# Patient Record
Sex: Female | Born: 1975 | Race: Black or African American | Hispanic: No | Marital: Married | Smoking: Never smoker
Health system: Southern US, Community
[De-identification: ages and names within clinical notes are randomized; demographics above are authoritative.]

## PROBLEM LIST (undated history)

## (undated) ENCOUNTER — Inpatient Hospital Stay (HOSPITAL_COMMUNITY): Payer: Self-pay

## (undated) DIAGNOSIS — F419 Anxiety disorder, unspecified: Secondary | ICD-10-CM

## (undated) DIAGNOSIS — G4730 Sleep apnea, unspecified: Secondary | ICD-10-CM

## (undated) DIAGNOSIS — I509 Heart failure, unspecified: Secondary | ICD-10-CM

## (undated) DIAGNOSIS — E669 Obesity, unspecified: Secondary | ICD-10-CM

## (undated) DIAGNOSIS — G43909 Migraine, unspecified, not intractable, without status migrainosus: Secondary | ICD-10-CM

## (undated) DIAGNOSIS — F418 Other specified anxiety disorders: Secondary | ICD-10-CM

## (undated) DIAGNOSIS — O200 Threatened abortion: Secondary | ICD-10-CM

## (undated) DIAGNOSIS — O039 Complete or unspecified spontaneous abortion without complication: Secondary | ICD-10-CM

## (undated) DIAGNOSIS — K5792 Diverticulitis of intestine, part unspecified, without perforation or abscess without bleeding: Secondary | ICD-10-CM

## (undated) DIAGNOSIS — J811 Chronic pulmonary edema: Secondary | ICD-10-CM

## (undated) DIAGNOSIS — M1710 Unilateral primary osteoarthritis, unspecified knee: Secondary | ICD-10-CM

## (undated) DIAGNOSIS — E119 Type 2 diabetes mellitus without complications: Secondary | ICD-10-CM

## (undated) DIAGNOSIS — O1490 Unspecified pre-eclampsia, unspecified trimester: Secondary | ICD-10-CM

## (undated) DIAGNOSIS — I517 Cardiomegaly: Secondary | ICD-10-CM

## (undated) DIAGNOSIS — Z3490 Encounter for supervision of normal pregnancy, unspecified, unspecified trimester: Secondary | ICD-10-CM

## (undated) DIAGNOSIS — I5042 Chronic combined systolic (congestive) and diastolic (congestive) heart failure: Secondary | ICD-10-CM

## (undated) DIAGNOSIS — I10 Essential (primary) hypertension: Secondary | ICD-10-CM

## (undated) DIAGNOSIS — D649 Anemia, unspecified: Secondary | ICD-10-CM

## (undated) HISTORY — DX: Other specified anxiety disorders: F41.8

## (undated) HISTORY — DX: Diverticulitis of intestine, part unspecified, without perforation or abscess without bleeding: K57.92

## (undated) HISTORY — DX: Obesity, unspecified: E66.9

## (undated) HISTORY — PX: CHOLECYSTECTOMY: SHX55

## (undated) HISTORY — DX: Type 2 diabetes mellitus without complications: E11.9

## (undated) HISTORY — DX: Essential (primary) hypertension: I10

## (undated) HISTORY — DX: Anemia, unspecified: D64.9

## (undated) HISTORY — DX: Chronic combined systolic (congestive) and diastolic (congestive) heart failure: I50.42

## (undated) HISTORY — DX: Threatened abortion: O20.0

## (undated) HISTORY — DX: Unilateral primary osteoarthritis, unspecified knee: M17.10

## (undated) HISTORY — DX: Complete or unspecified spontaneous abortion without complication: O03.9

## (undated) HISTORY — DX: Migraine, unspecified, not intractable, without status migrainosus: G43.909

---

## 2000-01-01 ENCOUNTER — Other Ambulatory Visit: Admission: RE | Admit: 2000-01-01 | Discharge: 2000-01-01 | Payer: Self-pay | Admitting: Obstetrics and Gynecology

## 2000-01-01 ENCOUNTER — Other Ambulatory Visit: Admission: RE | Admit: 2000-01-01 | Discharge: 2000-01-01 | Payer: Self-pay | Admitting: Gynecology

## 2000-05-17 HISTORY — PX: BREAST REDUCTION SURGERY: SHX8

## 2000-09-21 ENCOUNTER — Other Ambulatory Visit: Admission: RE | Admit: 2000-09-21 | Discharge: 2000-09-21 | Payer: Self-pay | Admitting: Plastic Surgery

## 2000-09-21 ENCOUNTER — Encounter (INDEPENDENT_AMBULATORY_CARE_PROVIDER_SITE_OTHER): Payer: Self-pay | Admitting: Specialist

## 2001-11-23 ENCOUNTER — Ambulatory Visit (HOSPITAL_COMMUNITY): Admission: RE | Admit: 2001-11-23 | Discharge: 2001-11-23 | Payer: Self-pay | Admitting: *Deleted

## 2001-11-23 ENCOUNTER — Encounter: Payer: Self-pay | Admitting: *Deleted

## 2004-03-26 ENCOUNTER — Ambulatory Visit: Payer: Self-pay | Admitting: *Deleted

## 2004-04-02 ENCOUNTER — Ambulatory Visit: Payer: Self-pay | Admitting: Family Medicine

## 2004-11-20 ENCOUNTER — Encounter (INDEPENDENT_AMBULATORY_CARE_PROVIDER_SITE_OTHER): Payer: Self-pay | Admitting: *Deleted

## 2004-11-20 LAB — CONVERTED CEMR LAB
AST: 12 units/L
Albumin: 4 g/dL
BUN: 14 mg/dL
CO2: 24 meq/L
Calcium: 9.2 mg/dL
Chloride: 103 meq/L
Glucose, Bld: 92 mg/dL
Potassium: 4.4 meq/L
Sodium: 137 meq/L
Triglycerides: 225 mg/dL

## 2006-12-02 ENCOUNTER — Ambulatory Visit (HOSPITAL_COMMUNITY): Admission: RE | Admit: 2006-12-02 | Discharge: 2006-12-02 | Payer: Self-pay | Admitting: Obstetrics and Gynecology

## 2007-04-26 ENCOUNTER — Emergency Department (HOSPITAL_COMMUNITY): Admission: EM | Admit: 2007-04-26 | Discharge: 2007-04-26 | Payer: Self-pay | Admitting: Emergency Medicine

## 2007-05-14 ENCOUNTER — Emergency Department (HOSPITAL_COMMUNITY): Admission: EM | Admit: 2007-05-14 | Discharge: 2007-05-14 | Payer: Self-pay | Admitting: Emergency Medicine

## 2007-08-21 ENCOUNTER — Emergency Department (HOSPITAL_COMMUNITY): Admission: EM | Admit: 2007-08-21 | Discharge: 2007-08-21 | Payer: Self-pay | Admitting: Emergency Medicine

## 2008-01-16 ENCOUNTER — Ambulatory Visit: Payer: Self-pay | Admitting: Cardiology

## 2008-01-18 ENCOUNTER — Encounter: Payer: Self-pay | Admitting: Cardiology

## 2008-02-16 ENCOUNTER — Ambulatory Visit: Payer: Self-pay | Admitting: Cardiology

## 2008-03-01 ENCOUNTER — Ambulatory Visit: Payer: Self-pay | Admitting: Cardiology

## 2008-03-19 ENCOUNTER — Ambulatory Visit: Payer: Self-pay | Admitting: Cardiology

## 2008-04-10 ENCOUNTER — Ambulatory Visit: Payer: Self-pay | Admitting: Cardiology

## 2008-04-10 ENCOUNTER — Encounter: Payer: Self-pay | Admitting: Cardiology

## 2008-04-17 ENCOUNTER — Ambulatory Visit: Payer: Self-pay | Admitting: Cardiology

## 2008-04-22 ENCOUNTER — Encounter: Payer: Self-pay | Admitting: Physician Assistant

## 2008-06-03 ENCOUNTER — Encounter: Payer: Self-pay | Admitting: Physician Assistant

## 2008-06-06 ENCOUNTER — Ambulatory Visit: Payer: Self-pay | Admitting: Cardiology

## 2008-06-26 ENCOUNTER — Encounter: Payer: Self-pay | Admitting: Cardiology

## 2008-06-26 ENCOUNTER — Ambulatory Visit: Payer: Self-pay | Admitting: Cardiology

## 2008-07-22 ENCOUNTER — Emergency Department (HOSPITAL_COMMUNITY): Admission: EM | Admit: 2008-07-22 | Discharge: 2008-07-22 | Payer: Self-pay | Admitting: Emergency Medicine

## 2008-07-24 ENCOUNTER — Ambulatory Visit: Payer: Self-pay | Admitting: Cardiology

## 2008-07-30 ENCOUNTER — Emergency Department (HOSPITAL_COMMUNITY): Admission: EM | Admit: 2008-07-30 | Discharge: 2008-07-30 | Payer: Self-pay | Admitting: Emergency Medicine

## 2008-07-30 ENCOUNTER — Encounter: Payer: Self-pay | Admitting: Cardiology

## 2008-10-08 ENCOUNTER — Ambulatory Visit: Payer: Self-pay | Admitting: Cardiology

## 2008-11-12 ENCOUNTER — Emergency Department (HOSPITAL_COMMUNITY): Admission: EM | Admit: 2008-11-12 | Discharge: 2008-11-12 | Payer: Self-pay | Admitting: Emergency Medicine

## 2008-12-19 ENCOUNTER — Telehealth: Payer: Self-pay | Admitting: Cardiology

## 2009-01-02 ENCOUNTER — Telehealth (INDEPENDENT_AMBULATORY_CARE_PROVIDER_SITE_OTHER): Payer: Self-pay | Admitting: *Deleted

## 2009-04-16 ENCOUNTER — Encounter (INDEPENDENT_AMBULATORY_CARE_PROVIDER_SITE_OTHER): Payer: Self-pay | Admitting: *Deleted

## 2009-04-16 ENCOUNTER — Ambulatory Visit: Payer: Self-pay | Admitting: Cardiology

## 2009-04-16 DIAGNOSIS — E669 Obesity, unspecified: Secondary | ICD-10-CM

## 2009-04-16 HISTORY — DX: Obesity, unspecified: E66.9

## 2009-05-03 ENCOUNTER — Emergency Department (HOSPITAL_COMMUNITY): Admission: EM | Admit: 2009-05-03 | Discharge: 2009-05-03 | Payer: Self-pay | Admitting: Emergency Medicine

## 2009-07-20 ENCOUNTER — Emergency Department (HOSPITAL_COMMUNITY): Admission: EM | Admit: 2009-07-20 | Discharge: 2009-07-20 | Payer: Self-pay | Admitting: Emergency Medicine

## 2009-12-08 ENCOUNTER — Encounter (INDEPENDENT_AMBULATORY_CARE_PROVIDER_SITE_OTHER): Payer: Self-pay | Admitting: *Deleted

## 2009-12-09 ENCOUNTER — Ambulatory Visit: Payer: Self-pay | Admitting: Cardiology

## 2009-12-09 ENCOUNTER — Encounter: Payer: Self-pay | Admitting: Adult Health

## 2009-12-09 ENCOUNTER — Encounter (INDEPENDENT_AMBULATORY_CARE_PROVIDER_SITE_OTHER): Payer: Self-pay | Admitting: *Deleted

## 2009-12-09 DIAGNOSIS — Z9989 Dependence on other enabling machines and devices: Secondary | ICD-10-CM

## 2009-12-09 DIAGNOSIS — G4733 Obstructive sleep apnea (adult) (pediatric): Secondary | ICD-10-CM | POA: Insufficient documentation

## 2009-12-09 LAB — CONVERTED CEMR LAB
BUN: 12 mg/dL
CO2: 20 meq/L (ref 19–32)
Chloride: 105 meq/L (ref 96–112)
Creatinine, Ser: 0.79 mg/dL
Glucose, Bld: 97 mg/dL
Potassium: 3.7 meq/L
Potassium: 3.7 meq/L (ref 3.5–5.3)
Sodium: 139 meq/L (ref 135–145)

## 2009-12-12 ENCOUNTER — Encounter (INDEPENDENT_AMBULATORY_CARE_PROVIDER_SITE_OTHER): Payer: Self-pay | Admitting: *Deleted

## 2009-12-12 ENCOUNTER — Ambulatory Visit: Payer: Self-pay | Admitting: Cardiology

## 2009-12-12 DIAGNOSIS — E876 Hypokalemia: Secondary | ICD-10-CM | POA: Insufficient documentation

## 2009-12-12 LAB — CONVERTED CEMR LAB
Calcium: 9.5 mg/dL
Glucose, Bld: 170 mg/dL
Potassium: 4 meq/L
Sodium: 132 meq/L

## 2009-12-14 LAB — CONVERTED CEMR LAB
BUN: 12 mg/dL (ref 6–23)
CO2: 24 meq/L (ref 19–32)
Chloride: 101 meq/L (ref 96–112)
Creatinine, Ser: 0.88 mg/dL (ref 0.40–1.20)
Glucose, Bld: 170 mg/dL — ABNORMAL HIGH (ref 70–99)
Potassium: 4 meq/L (ref 3.5–5.3)

## 2009-12-15 ENCOUNTER — Encounter: Payer: Self-pay | Admitting: Cardiology

## 2009-12-15 ENCOUNTER — Ambulatory Visit: Payer: Self-pay | Admitting: Cardiology

## 2009-12-15 ENCOUNTER — Ambulatory Visit (HOSPITAL_COMMUNITY): Admission: RE | Admit: 2009-12-15 | Discharge: 2009-12-15 | Payer: Self-pay | Admitting: Cardiology

## 2009-12-22 ENCOUNTER — Ambulatory Visit: Payer: Self-pay | Admitting: Cardiology

## 2010-01-08 ENCOUNTER — Encounter: Payer: Self-pay | Admitting: Adult Health

## 2010-01-09 ENCOUNTER — Encounter (INDEPENDENT_AMBULATORY_CARE_PROVIDER_SITE_OTHER): Payer: Self-pay | Admitting: *Deleted

## 2010-01-13 ENCOUNTER — Ambulatory Visit: Payer: Self-pay | Admitting: Cardiology

## 2010-01-14 ENCOUNTER — Telehealth (INDEPENDENT_AMBULATORY_CARE_PROVIDER_SITE_OTHER): Payer: Self-pay

## 2010-03-12 ENCOUNTER — Encounter (INDEPENDENT_AMBULATORY_CARE_PROVIDER_SITE_OTHER): Payer: Self-pay | Admitting: *Deleted

## 2010-03-16 ENCOUNTER — Ambulatory Visit: Payer: Self-pay | Admitting: Cardiology

## 2010-06-09 ENCOUNTER — Emergency Department (HOSPITAL_COMMUNITY)
Admission: EM | Admit: 2010-06-09 | Discharge: 2010-06-10 | Payer: Self-pay | Source: Home / Self Care | Admitting: Emergency Medicine

## 2010-06-16 NOTE — Letter (Signed)
Summary: Appointment - Missed  [facility]  [address], [place] [address]   Phone: [phone]  Fax: [phone]     March 12, 2010 MRN: [id number]   Chelsey Santiago [address] [place], [place]  [address]   Dear Chelsey Santiago,  Our records indicate you missed your appointment on     03/12/10 Joni Reining NP          It is very important that we reach you to reschedule this appointment. We look forward to participating in your health care needs. Please contact us at the number listed above at your earliest convenience to reschedule this appointment.     Sincerely,    Glass blower/designer

## 2010-06-16 NOTE — Assessment & Plan Note (Signed)
Summary: nurse visit per checkout on 12/09/09 for bp check/tg  Nurse Visit   Vital Signs:  Patient profile:   35 year old female Height:      66 inches Weight:      246 pounds O2 Sat:      97 % on Room air Pulse rate:   80 / minute Pulse (ortho):   86 / minute BP sitting:   141 / 93  (left arm) BP standing:   112 / 72  Vitals Entered By: Teressa Lower RN (December 12, 2009 9:39 AM)  O2 Flow:  Room air  Serial Vital Signs/Assessments:  Time      Position  BP       Pulse  Resp  Temp     By 10:12 AM  Lying LA  122/79   78                    Kynzley Dowson RN 10:12 AM  Sitting   117/76   78                    Shellyann Wandrey RN 10:12 AM  Standing  112/72   86                    Babatunde Seago RN  Comments: 10:12 AM no c/o during ortho vs By: Teressa Lower RN    Visit Type:  3 day nurse visit Primary Provider:  [facility] Department   History of Present Illness: S: HTN:167/111 at of 12/09/2009 B:gave benicar/hct 40/12.5 samples in office 12/09/09, bmp 12/09/09 K+ 3.7     pt has lost 7 lbs since 3 days A: c/o weakness and dizziness today only R: Spoke with Dr. Daleen Squibb DOD- orders given for ortho vs, bmp stat  12/15/09 bmp : sodium 132- no change in tx at this time continue current meds per orders Friday from DOD Dr. Daleen Squibb   Preventive Screening-Counseling & Management  Alcohol-Tobacco     Smoking Status: never  Current Medications (verified): 1)  Labetalol Hcl 200 Mg Tabs (Labetalol Hcl) .... Take 4 (Four) Tabs Twice A Day 2)  Clonidine Hcl 0.2 Mg Tabs (Clonidine Hcl) .... Take 1-2 Tablet By Mouth Every Day At Bedtime For Sleep As Needed 3)  Amlodipine Besylate 5 Mg Tabs (Amlodipine Besylate) .... Take One Tablet By Mouth Daily 4)  Lexapro 10 Mg Tabs (Escitalopram Oxalate) .... Take 1 Tab Daily 5)  Alprazolam 1 Mg Tabs (Alprazolam) .... Take 1 Tab Two Times A Day 6)  Benicar Hct 40-12.5 Mg Tabs (Olmesartan Medoxomil-Hctz) .... Take 1 Tablet By Mouth Once A Day  Samples  Allergies (verified): No Known Drug Allergies  Orders Added: 1)  T-Basic Metabolic Panel [[id number]]    Appended Document: nurse visit per checkout on 12/09/09 for bp check/tg No changes continue benicar as directed.  If Rx needed,please call in.  Appended Document: nurse visit per checkout on 12/09/09 for bp check/tg    Clinical Lists Changes  Medications: Changed medication from BENICAR HCT 40-12.5 MG TABS (OLMESARTAN MEDOXOMIL-HCTZ) Take 1 tablet by mouth once a day SAMPLES to BENICAR HCT 40-12.5 MG TABS (OLMESARTAN MEDOXOMIL-HCTZ) Take 1 tablet by mouth once a day - Signed Rx of BENICAR HCT 40-12.5 MG TABS (OLMESARTAN MEDOXOMIL-HCTZ) Take 1 tablet by mouth once a day;  #30 x 6;  Signed;  Entered by: Teressa Lower RN;  Authorized by: Joni Reining, NP;  Method used: Electronically to [facility]  [address], [place], [place], [place]  [address], Ph: [phone], Fax: [phone]    Prescriptions: BENICAR HCT 40-12.5 MG TABS (OLMESARTAN MEDOXOMIL-HCTZ) Take 1 tablet by mouth once a day  #30 x 6   Entered by:   Teressa Lower RN   Authorized by:   Joni Reining, NP   Signed by:   Teressa Lower RN on 12/31/2009   Method used:   Electronically to        [facility]  [address] (retail)       [address]       [place], [place]  [address]       Ph: [phone]       Fax: [phone]   RxID:   [id number]

## 2010-06-16 NOTE — Assessment & Plan Note (Signed)
**Note De-Identified Hoorain Kozakiewicz Obfuscation** Summary: BLOOD PRES CK/RM  Nurse Visit   Vital Signs:  Patient profile:   35 year old female Weight:      246 pounds O2 Sat:      97 % on Room air Pulse rate:   82 / minute Pulse (ortho):   93 / minute BP sitting:   114 / 79  (left arm) BP standing:   100 / 66  Vitals Entered ByLarita Fife Auden Wettstein LPN (December 22, 2009 2:21 PM)  O2 Flow:  Room air  Serial Vital Signs/Assessments:  Time      Position  BP       Pulse  Resp  Temp     By 2:22 PM   Lying LA  115/79   81                    Lynn Felishia Wartman LPN 0:98 PM   Sitting   111/73   83                    Lynn Breylon Sherrow LPN 1:19 PM   Standing  100/66   93                    Lynn Kiosha Buchan LPN  Comments: no s/s  By: Larita Fife Naturi Alarid LPN    Current Medications (verified): 1)  Labetalol Hcl 200 Mg Tabs (Labetalol Hcl) .... Take 4 (Four) Tabs Twice A Day 2)  Clonidine Hcl 0.2 Mg Tabs (Clonidine Hcl) .... Take 1-2 Tablet By Mouth Every Day At Bedtime For Sleep As Needed 3)  Amlodipine Besylate 5 Mg Tabs (Amlodipine Besylate) .... Take One Tablet By Mouth Daily 4)  Lexapro 10 Mg Tabs (Escitalopram Oxalate) .... Take 1 Tab Daily 5)  Alprazolam 1 Mg Tabs (Alprazolam) .... Take 1 Tab Two Times A Day 6)  Benicar Hct 40-12.5 Mg Tabs (Olmesartan Medoxomil-Hctz) .... Take 1 Tablet By Mouth Once A Day Samples  Allergies (verified): No Known Drug Allergies  Primary Provider:  [facility] Department   History of Present Illness: S: Pt. walked into office with c/o dizziness, weakness and fatigue. B: On 12-09-09 OV with Harriet Pho, NP pt. was given samples of Benicar/HCT 40/12.5mg  once daily for HTN. Pt. returned 3 days later (12-12-09)for scheduled BP check/nurse visit, at that time pt. had lost 7 lbs. and c/o weakness and dizziness. Dr. Daleen Squibb, DOD, ordered a BMET stat which revealed sodium level of 132. Dr. Daleen Squibb made no changes in treatment at that time and recommended pt. continue current meds. A: Pt. c/o being weak and tired often and dizziness. BP today is  114/79 with a HR of 82. Pt. states her BP is never this low and wants to know if this is normal for her to feel this way, as her BP is normally higher, or does her meds need to be adjusted. Pt. did not bring in meds, we went over list and pt. states she takes as advised.  R: Pt. advised to be careful while mobile and we will call her with Dr. Marvel Plan recommendations, if any.   Appended Document: BLOOD PRES CK/RM 12/28/09  Instructions for alprazolam and clonidine are reversed; please correct. D/C benicar/HCT. Lisinopril 20 mg once daily. Home BPs BP check in 1 month with orthostatic VS BMet in 1 month.  [place] Bing, M.D.  Appended Document: BLOOD PRES CK/RM Pt. does take Clonidine 0.2mg  at bedtime for psychiatric reasons per her psychiatrist and pt. confirmed alprazolam 1mg  by **Note De-Identified  Obfuscation** mouth two times a day. Pt. states she was just advised by K. Lyman Bishop, NP on 12-09-09 OV to stop taking Lisinopril and to start taking Benicar/HCTZ 40/12.5mg  once daily and states she has no complaints, wants to know if she can stay on Benicar and if so will she still need BMET in 1 month? Please advise. She states she will check BP's at [facility] as often as possible. Pt. has appt. to see Dr. Dietrich Pates on 01-13-10 @ 1:15.

## 2010-06-16 NOTE — Letter (Signed)
Summary: DRUG REQUEST FORM/BENICAR  DRUG REQUEST FORM/BENICAR   Imported By: Faythe Ghee 01/08/2010 12:25:27  _____________________________________________________________________  External Attachment:    Type:   Image     Comment:   External Document

## 2010-06-16 NOTE — Miscellaneous (Signed)
Summary: LABS CMP,LIPIDS,11/20/2004  Clinical Lists Changes  Observations: Added new observation of CALCIUM: 9.2 mg/dL (91/47/8295 62:13) Added new observation of ALBUMIN: 4.0 g/dL (08/65/7846 96:29) Added new observation of PROTEIN, TOT: 7.6 g/dL (52/84/1324 40:10) Added new observation of SGPT (ALT): 11 units/L (11/20/2004 10:54) Added new observation of SGOT (AST): 12 units/L (11/20/2004 10:54) Added new observation of ALK PHOS: 75 units/L (11/20/2004 10:54) Added new observation of CREATININE: 14 mg/dL (27/25/3664 40:34) Added new observation of BUN: 14 mg/dL (74/25/9563 87:56) Added new observation of BG RANDOM: 92 mg/dL (43/32/9518 84:16) Added new observation of CO2 PLSM/SER: 24 meq/L (11/20/2004 10:54) Added new observation of CL SERUM: 103 meq/L (11/20/2004 10:54) Added new observation of K SERUM: 4.4 meq/L (11/20/2004 10:54) Added new observation of NA: 137 meq/L (11/20/2004 10:54) Added new observation of LDL: 71 mg/dL (60/63/0160 10:93) Added new observation of HDL: 38 mg/dL (23/55/7322 02:54) Added new observation of TRIGLYC TOT: 225 mg/dL (27/10/2374 28:31) Added new observation of CHOLESTEROL: 154 mg/dL (51/76/1607 37:10)

## 2010-06-16 NOTE — Miscellaneous (Signed)
Summary: LABS 12/09/2009 BMP,12/12/2009  Clinical Lists Changes  Observations: Added new observation of CALCIUM: 9.5 mg/dL (16/02/9603 54:09) Added new observation of CREATININE: 0.88 mg/dL (81/19/1478 29:56) Added new observation of BUN: 12 mg/dL (21/30/8657 84:69) Added new observation of BG RANDOM: 170 mg/dL (62/95/2841 32:44) Added new observation of CO2 PLSM/SER: 24 meq/L (12/12/2009 11:53) Added new observation of CL SERUM: 101 meq/L (12/12/2009 11:53) Added new observation of K SERUM: 4.0 meq/L (12/12/2009 11:53) Added new observation of NA: 132 meq/L (12/12/2009 11:53) Added new observation of CALCIUM: 8.7 mg/dL (05/19/7251 66:44) Added new observation of CREATININE: 0.79 mg/dL (03/47/4259 56:38) Added new observation of BUN: 12 mg/dL (75/64/3329 51:88) Added new observation of BG RANDOM: 97 mg/dL (41/66/0630 16:01) Added new observation of CO2 PLSM/SER: 20 meq/L (12/09/2009 11:53) Added new observation of CL SERUM: 105 meq/L (12/09/2009 11:53) Added new observation of K SERUM: 3.7 meq/L (12/09/2009 11:53) Added new observation of NA: 139 meq/L (12/09/2009 11:53)

## 2010-06-16 NOTE — Progress Notes (Signed)
**Note De-Identified Marijean Montanye Obfuscation** Summary: Diovan approval  Phone Note Outgoing Call   Call placed by: Larita Fife Khamille Beynon LPN,  January 14, 2010 3:03 PM Summary of Call: [facility] pharmacy notified that Diovan HCT 320/12.5mg  one tablet daily has been approved. While on hold, pharmacy verified approval and is filling RX. Initial call taken by: Larita Fife Zeenat Jeanbaptiste LPN,  January 14, 2010 3:11 PM

## 2010-06-16 NOTE — Assessment & Plan Note (Signed)
Summary: 1 mth f/u per checkout on 12/09/09/tg   Visit Type:  Follow-up Primary Provider:  [facility] Department   History of Present Illness: [name] returns to the office as scheduled for continued assessment and treatment of hypertensive heart disease with a history of congestive heart failure immediately following pregnancy and delivery.  Since then, she has done extremely well.  She is exercising frequently and has lost 9 pounds.  Blood pressure was ideal when last checked a month ago.  Unfortunately, she has had some personal problems including the death of a cousin and hospitalization of her mother for congestive heart failure.  This has led to suboptimal compliance with her medical regime.  Patient has no clinical history of diabetes and was apparently not diabetic during her recent pregnancy; however, a blood glucose level of 170 was recorded last month; a subsequent measurement was normal.  Current Medications (verified): 1)  Labetalol Hcl 200 Mg Tabs (Labetalol Hcl) .... Take 4 (Four) Tabs Twice A Day 2)  Clonidine Hcl 0.2 Mg Tabs (Clonidine Hcl) .... Take 1-2 Tablet By Mouth Every Day At Bedtime For Sleep As Needed 3)  Amlodipine Besylate 5 Mg Tabs (Amlodipine Besylate) .... Take One Tablet By Mouth Daily 4)  Lexapro 10 Mg Tabs (Escitalopram Oxalate) .... Take 1 Tab Daily 5)  Alprazolam 1 Mg Tabs (Alprazolam) .... Take 1 Tab Two Times A Day 6)  Diovan Hct 320-12.5 Mg Tabs (Valsartan-Hydrochlorothiazide) .... Take 1 Tablet By Mouth Once A Day  Allergies (verified): No Known Drug Allergies  Past History:  PMH, FH, and Social History reviewed and updated.  Past Medical History: Hypertension Hypertensive heart disease with history of pulmonary edema postpartum Migraine headaches Anemia Mitral regurg-mild to mod. when hospitalized for CHF in 2009; no MR in 12/2009+nl EF  Review of Systems       The patient complains of weight loss.  The patient denies  anorexia, hoarseness, chest pain, syncope, dyspnea on exertion, peripheral edema, prolonged cough, headaches, and abdominal pain.    Vital Signs:  Patient profile:   35 year old female Weight:      244 pounds BMI:     39.52 Pulse rate:   111 / minute BP sitting:   161 / 104  (right arm)  Vitals Entered By: Dreama Saa, CNA (January 13, 2010 1:24 PM)  Physical Exam  General:  Obese; well developed; no acute distress:   Neck-No JVD; no carotid bruits: Lungs-No tachypnea, no rales; no rhonchi; no wheezes: Cardiovascular-normal PMI; normal S1 and S2; S4 present Abdomen-BS normal; soft and non-tender without masses or organomegaly:  Musculoskeletal-No deformities, no cyanosis or clubbing: Neurologic-Normal cranial nerves; symmetric strength and tone:  Skin-Warm, no significant lesions: Extremities-Nl distal pulses; no edema:     Impression & Recommendations:  Problem # 1:  HYPERTENSIVE HEART DISEASE, WITH CHF (ICD-402.11) Since blood pressure control was excellent a month ago, current medications will be continued.  Patient will monitor blood pressure at home and return for a visit with the nurses in 2 months.  At the request of her insurer, valsartan will be substituted for Benicar at maximal dose.  Chemistry profile will be checked in 2 months.  Since a random blood glucose value was fairly high, a hemoglobin A1c level will also be obtained.  I will reassess this nice woman in 6 months.  She is congratulated on exercise and weight loss and encouraged to continue compliance with her antihypertensive regime.  Patient Instructions: 1)  Your physician  recommends that you schedule a follow-up appointment in: 6 months 2)  Your physician recommends that you return for lab work in: 2 months 3)  You have been referred to nurse visit in 2 months 4)  Your physician discussed the importance of regular exercise and recommended that you start or continue a regular exercise program for good  health. 5)  Your physician encouraged you to lose weight for better health. 6)  Your physician has requested that you regularly monitor and record your blood pressure readings at home.  Please use the same machine at the same time of day to check your readings and record them to bring to your follow-up visit. ( large cuff) 7)  Your physician has recommended you make the following change in your medication: stop benicar/hct and begin diovan/hct 320/12.5 daily Prescriptions: DIOVAN HCT 320-12.5 MG TABS (VALSARTAN-HYDROCHLOROTHIAZIDE) Take 1 tablet by mouth once a day  #30 x 6   Entered by:   Teressa Lower RN   Authorized by:   Kathlen Brunswick, MD, [facility]   Signed by:   Teressa Lower RN on 01/13/2010   Method used:   Electronically to        [facility]  [address] (retail)       [address]       [place], [place]  [address]       Ph: [phone]       Fax: [phone]   RxID:   [id number]

## 2010-06-16 NOTE — Miscellaneous (Signed)
Summary: ECHO 12/15/2009  Clinical Lists Changes  Observations: Added new observation of ECHOINTERP:   Study Conclusions    - Left ventricle: The cavity size was at the upper limits of normal.     Borderline LVH was present. Systolic function was low normal to     mildly reduced with an estimated ejection fraction of 55%. Mild     hypokinesis of the anteroseptal myocardium.   - Left atrium: The atrium was mildly dilated.   Transthoracic echocardiography. M-mode, complete 2D, spectral   Doppler, and color Doppler. Height: Height: 167.6cm. Height: 66in.   Weight: Weight: 111.6kg. Weight: 245.5lb. Body mass index: BMI:   39.7kg/m^2. Body surface area: BSA: 2.25m^2. Patient status:   Outpatient. Location: Echo laboratory.    --------------------------------------------------------------------  (12/15/2009 11:56)      Echocardiogram  Procedure date:  12/15/2009  Findings:        Study Conclusions    - Left ventricle: The cavity size was at the upper limits of normal.     Borderline LVH was present. Systolic function was low normal to     mildly reduced with an estimated ejection fraction of 55%. Mild     hypokinesis of the anteroseptal myocardium.   - Left atrium: The atrium was mildly dilated.   Transthoracic echocardiography. M-mode, complete 2D, spectral   Doppler, and color Doppler. Height: Height: 167.6cm. Height: 66in.   Weight: Weight: 111.6kg. Weight: 245.5lb. Body mass index: BMI:   39.7kg/m^2. Body surface area: BSA: 2.47m^2. Patient status:   Outpatient. Location: Echo laboratory.    --------------------------------------------------------------------

## 2010-06-16 NOTE — Assessment & Plan Note (Signed)
Summary: past due for f/u per pt request/tg   Visit Type:  Follow-up Primary Provider:  [facility] Department  CC:  no cardiology complaints.  History of Present Illness: [name] who has not been seen in our office since Dec of 2010. She has not been taking her medications since Feb of 2011 as she states she was fed up with the medications and saw no improvement in her blood pressure.  She became very depressed and did not feel like taking care of herself.  Now she has new found interest in doing so and actually took her medications today before coming in.  She remains hyptertensive.    She also admitts to noncompliance with CPAP, stating that the nasal plugs are hard to wear.  She is interested in persuing mask fitting.  She states that she is trying to lose weight and has been taking Zumba classes.  She is frustrated that she is not seeing results.  Preventive Screening-Counseling & Management  Alcohol-Tobacco     Alcohol drinks/day: 0     Smoking Status: never  Current Medications (verified): 1)  Labetalol Hcl 200 Mg Tabs (Labetalol Hcl) .... Take 4 (Four) Tabs Twice A Day 2)  Clonidine Hcl 0.2 Mg Tabs (Clonidine Hcl) .... Take 1-2 Tablet By Mouth Every Day At Bedtime For Sleep As Needed 3)  Amlodipine Besylate 5 Mg Tabs (Amlodipine Besylate) .... Take One Tablet By Mouth Daily 4)  Lexapro 10 Mg Tabs (Escitalopram Oxalate) .... Take 1 Tab Daily 5)  Alprazolam 1 Mg Tabs (Alprazolam) .... Take 1 Tab Two Times A Day 6)  Benicar Hct 40-12.5 Mg Tabs (Olmesartan Medoxomil-Hctz) .... Take 1 Tablet By Mouth Once A Day Samples  Allergies (verified): No Known Drug Allergies  Past History:  Past medical, surgical, family and social histories (including risk factors) reviewed, and no changes noted (except as noted below).  Past Medical History: Reviewed history from 04/16/2009 and no changes required. Hypertension Hypertensive heart disease with history of pulmonary  edema postpartum Migraine headaches Anemia Mitral Insufficiency-mild to moderate when hospitalized for congestive heart failure in 2009  Past Surgical History: Reviewed history from 04/16/2008 and no changes required. Breast reduction 2002  Family History: Reviewed history from 04/16/2008 and no changes required. Family History of Diabetes:   Social History: Reviewed history from 04/16/2008 and no changes required. Single  Tobacco Use - No.  Alcohol Use - no Drug Use - no Alcohol drinks/day:  0  Review of Systems       Depression, noncompliance-self reported, fatigue.  All other systems have been reviewed and are negative unless stated above.   Vital Signs:  Patient profile:   35 year old female Weight:      253 pounds Pulse rate:   98 / minute BP sitting:   167 / 111  (right arm)  Vitals Entered By: Dreama Saa, CNA (December 09, 2009 3:02 PM)  Physical Exam  General:  Well developed, well nourished, in no acute distress. Head:  normocephalic and atraumatic Eyes:  PERRLA/EOM intact; conjunctiva and lids normal. Lungs:  Clear bilaterally to auscultation and percussion. Heart:  RRR with S4 gallop pronounced. Abdomen:  Bowel sounds positive; abdomen soft and non-tender without masses, organomegaly, or hernias noted. No hepatosplenomegaly. Msk:  Back normal, normal gait. Muscle strength and tone normal. Psych:  anxious.     Impression & Recommendations:  Problem # 1:  HYPERTENSION (ICD-401.1) After lengthy and detailed overveiw of her medications, it was found that she has  been on multiple medication regimines. She is still hypertensive despite taking medications today.  She is only taking the labetolol and lisinopril at this time, and only took it today. She does take clonidine 0.2 mg at [facility] for psychiatric reasons per psychiatrist.   She will resume the amlodipine 5mg  daily as prescribed. We will evaluate her with an ECHO for LV fx, check her BMET for renal fx.  She will  be fitted for a CPAP mask.  I have given her samples of Benicar 40/12.5mg  that she is to take instead of lisinopril as she requested samples.   The following medications were removed from the medication list:    Aldactone 50 Mg Tabs (Spironolactone) .Marland Kitchen... Take 1 tablet by mouth once a day    Lisinopril 40 Mg Tabs (Lisinopril) .Marland Kitchen... Take 1 tablet by mouth once a day Her updated medication list for this problem includes:    Labetalol Hcl 200 Mg Tabs (Labetalol hcl) .Marland Kitchen... Take 4 (four) tabs twice a day    Clonidine Hcl 0.2 Mg Tabs (Clonidine hcl) .Marland Kitchen... Take 1-2 tablet by mouth every day at bedtime for sleep as needed    Amlodipine Besylate 5 Mg Tabs (Amlodipine besylate) .Marland Kitchen... Take one tablet by mouth daily    Benicar Hct 40-12.5 Mg Tabs (Olmesartan medoxomil-hctz) .Marland Kitchen... Take 1 tablet by mouth once a day samples  Orders: T-Basic Metabolic Panel [id number])  Problem # 2:  SLEEP APNEA (ICD-780.57) Assessment: Deteriorated Will be fitted for a mask.  Encouraged to wear properly. Orders: Durable Medical Equipment (DME)  Problem # 3:  OBESITY (ICD-278.00) Assessment: Unchanged  Other Orders: 2-D Echocardiogram (2D Echo)  Patient Instructions: 1)  Your physician recommends that you schedule a follow-up appointment in: 1 month 2)  Your physician has recommended you make the following change in your medication: stop lisinopril ,  continue labetolo and amlodipine 5mg  daily, benicar 40/12.5 daily samples given 3)  You have been referr to Nurse visit on Friday for bp check 4)  Your physician recommends that you return for lab work in: today 5)  WE WILL CALL TO SEE IF A MASK FOR YOUR CPAP CAN BE ORDERED 6)  Your physician has requested that you have an echocardiogram.  Echocardiography is a painless test that uses sound waves to create images of your heart. It provides your doctor with information about the size and shape of your heart and how well your heart's chambers and valves are working.   This procedure takes approximately one hour. There are no restrictions for this procedure.

## 2010-06-23 ENCOUNTER — Encounter: Payer: Self-pay | Admitting: Cardiology

## 2010-07-23 NOTE — Letter (Signed)
Summary: [facility] - Office Note  [facility] - Office Note   Imported By: Marylou Mccoy 07/16/2010 16:29:30  _____________________________________________________________________  External Attachment:    Type:   Image     Comment:   External Document

## 2010-08-18 ENCOUNTER — Emergency Department (HOSPITAL_COMMUNITY): Payer: Medicaid Other

## 2010-08-18 ENCOUNTER — Emergency Department (HOSPITAL_COMMUNITY)
Admission: EM | Admit: 2010-08-18 | Discharge: 2010-08-19 | Disposition: A | Discharge status: Home / Self Care | Payer: Medicaid Other | Attending: Emergency Medicine | Admitting: Emergency Medicine

## 2010-08-18 DIAGNOSIS — Z79899 Other long term (current) drug therapy: Secondary | ICD-10-CM | POA: Insufficient documentation

## 2010-08-18 DIAGNOSIS — I10 Essential (primary) hypertension: Secondary | ICD-10-CM | POA: Insufficient documentation

## 2010-08-18 DIAGNOSIS — R079 Chest pain, unspecified: Secondary | ICD-10-CM | POA: Insufficient documentation

## 2010-08-18 DIAGNOSIS — Z833 Family history of diabetes mellitus: Secondary | ICD-10-CM | POA: Insufficient documentation

## 2010-08-18 DIAGNOSIS — I517 Cardiomegaly: Secondary | ICD-10-CM | POA: Insufficient documentation

## 2010-08-18 DIAGNOSIS — G43909 Migraine, unspecified, not intractable, without status migrainosus: Secondary | ICD-10-CM | POA: Insufficient documentation

## 2010-08-18 DIAGNOSIS — R110 Nausea: Secondary | ICD-10-CM | POA: Insufficient documentation

## 2010-08-18 DIAGNOSIS — F341 Dysthymic disorder: Secondary | ICD-10-CM | POA: Insufficient documentation

## 2010-08-24 LAB — BASIC METABOLIC PANEL
BUN: 9 mg/dL (ref 6–23)
Chloride: 104 mEq/L (ref 96–112)
Creatinine, Ser: 0.76 mg/dL (ref 0.4–1.2)
GFR calc Af Amer: >60 mL/min (ref >60)
GFR calc non Af Amer: >60 mL/min (ref >60)
Potassium: 3.6 mEq/L (ref 3.5–5.1)

## 2010-08-24 LAB — CBC
HCT: 33.6 % — ABNORMAL LOW (ref 36.0–46.0)
MCV: 73.9 fL — ABNORMAL LOW (ref 78.0–100.0)
Platelets: 218 10*3/uL (ref 150–400)
RBC: 4.54 MIL/uL (ref 3.87–5.11)
WBC: 9.3 10*3/uL (ref 4.0–10.5)

## 2010-08-24 LAB — DIFFERENTIAL
Lymphocytes Relative: 25 % (ref 12–46)
Lymphs Abs: 2.3 10*3/uL (ref 0.7–4.0)
Monocytes Relative: 7 % (ref 3–12)
Neutrophils Relative %: 66 % (ref 43–77)

## 2010-08-24 LAB — BRAIN NATRIURETIC PEPTIDE: Pro B Natriuretic peptide (BNP): <30 pg/mL (ref 0.0–100.0)

## 2010-08-27 LAB — DIFFERENTIAL
Basophils Relative: 0 % (ref 0–1)
Eosinophils Absolute: 0.1 10*3/uL (ref 0.0–0.7)
Eosinophils Relative: 1 % (ref 0–5)
Monocytes Absolute: 0.5 10*3/uL (ref 0.1–1.0)
Monocytes Relative: 5 % (ref 3–12)
Neutro Abs: 7.4 10*3/uL (ref 1.7–7.7)

## 2010-08-27 LAB — CBC
HCT: 33 % — ABNORMAL LOW (ref 36.0–46.0)
Hemoglobin: 10.6 g/dL — ABNORMAL LOW (ref 12.0–15.0)
MCHC: 32.2 g/dL (ref 30.0–36.0)
MCV: 75.2 fL — ABNORMAL LOW (ref 78.0–100.0)
RBC: 4.39 MIL/uL (ref 3.87–5.11)
RDW: 15.1 % (ref 11.5–15.5)

## 2010-08-27 LAB — POCT CARDIAC MARKERS
CKMB, poc: <1 ng/mL — ABNORMAL LOW (ref 1.0–8.0)
Myoglobin, poc: 58.2 ng/mL (ref 12–200)
Myoglobin, poc: 73 ng/mL (ref 12–200)

## 2010-08-27 LAB — BASIC METABOLIC PANEL
CO2: 23 mEq/L (ref 19–32)
Chloride: 105 mEq/L (ref 96–112)
GFR calc Af Amer: >60 mL/min (ref >60)
Glucose, Bld: 126 mg/dL — ABNORMAL HIGH (ref 70–99)
Sodium: 135 mEq/L (ref 135–145)

## 2010-09-11 ENCOUNTER — Emergency Department (HOSPITAL_COMMUNITY)
Admission: EM | Admit: 2010-09-11 | Discharge: 2010-09-12 | Disposition: A | Discharge status: Home / Self Care | Payer: Medicaid Other | Attending: Emergency Medicine | Admitting: Emergency Medicine

## 2010-09-11 DIAGNOSIS — I10 Essential (primary) hypertension: Secondary | ICD-10-CM | POA: Insufficient documentation

## 2010-09-11 DIAGNOSIS — F3289 Other specified depressive episodes: Secondary | ICD-10-CM | POA: Insufficient documentation

## 2010-09-11 DIAGNOSIS — I517 Cardiomegaly: Secondary | ICD-10-CM | POA: Insufficient documentation

## 2010-09-11 DIAGNOSIS — F411 Generalized anxiety disorder: Secondary | ICD-10-CM | POA: Insufficient documentation

## 2010-09-11 DIAGNOSIS — F329 Major depressive disorder, single episode, unspecified: Secondary | ICD-10-CM | POA: Insufficient documentation

## 2010-09-11 DIAGNOSIS — G43909 Migraine, unspecified, not intractable, without status migrainosus: Secondary | ICD-10-CM | POA: Insufficient documentation

## 2010-09-11 DIAGNOSIS — K089 Disorder of teeth and supporting structures, unspecified: Secondary | ICD-10-CM | POA: Insufficient documentation

## 2010-09-15 ENCOUNTER — Telehealth: Payer: Self-pay | Admitting: Cardiology

## 2010-09-15 NOTE — Telephone Encounter (Signed)
Pt needs a note stating that it is okay for her to get her teeth pulled 09/16/10 since her BP had been 168/111 yesterday.

## 2010-09-15 NOTE — Telephone Encounter (Signed)
Pt is past due for office visit - scheduled for 09/17/10

## 2010-09-16 ENCOUNTER — Encounter: Payer: Self-pay | Admitting: Adult Health

## 2010-09-16 ENCOUNTER — Ambulatory Visit (INDEPENDENT_AMBULATORY_CARE_PROVIDER_SITE_OTHER): Payer: Medicaid Other | Admitting: Adult Health

## 2010-09-16 DIAGNOSIS — I10 Essential (primary) hypertension: Secondary | ICD-10-CM

## 2010-09-16 DIAGNOSIS — E870 Hyperosmolality and hypernatremia: Secondary | ICD-10-CM

## 2010-09-16 DIAGNOSIS — E669 Obesity, unspecified: Secondary | ICD-10-CM

## 2010-09-16 MED ORDER — AMLODIPINE BESYLATE 10 MG PO TABS: 10.0000 mg | ORAL_TABLET | Freq: Every day | ORAL | Status: DC

## 2010-09-16 MED ORDER — VALSARTAN-HYDROCHLOROTHIAZIDE 320-25 MG PO TABS: 1.0000 | ORAL_TABLET | Freq: Every day | ORAL | Status: DC

## 2010-09-16 NOTE — Assessment & Plan Note (Signed)
Weight loss is strongly recommended

## 2010-09-16 NOTE — Progress Notes (Signed)
HPI Mr. Chelsey Santiago is a morbidly obese AAF with known history of hypertensive heart disease, diastolic CHF and anxiety.  She presents today because she was sent by her dentist secondary to hypertension prior to having wisdom teeth pulled. She was found to have a BP of 169/118 in the dentist office.  Today she comes with BP elevation of 152/103.  She would like to proceed with tooth extractions but will need to have BP meds adjusted. She is without complaint except for a headache.  Not on File  Current Outpatient Prescriptions  Medication Sig Dispense Refill  . amLODipine (NORVASC) 10 MG tablet Take 1 tablet (10 mg total) by mouth daily.  30 tablet  3  . aspirin 81 MG tablet Take 81 mg by mouth daily.        Marland Kitchen labetalol (NORMODYNE) 200 MG tablet Take 200 mg by mouth 2 (two) times daily.        Marland Kitchen DISCONTD: amLODipine (NORVASC) 5 MG tablet Take 5 mg by mouth daily.        Marland Kitchen DISCONTD: valsartan-hydrochlorothiazide (DIOVAN-HCT) 320-12.5 MG per tablet Take 1 tablet by mouth daily.        . valsartan-hydrochlorothiazide (DIOVAN-HCT) 320-25 MG per tablet Take 1 tablet by mouth daily.  30 tablet  3    No past medical history on file.  No past surgical history on file.  ZOX:[id number] of systems complete and found to be negative unless listed above PHYSICAL EXAM BP 152/102  Pulse 105  Ht 5\' 6"  (1.676 m)  Wt 239 lb (108.41 kg)  BMI 38.58 kg/m2  SpO2 95%  LMP 07/18/2010 General: Well developed, well nourished obese AAF, in no acute distress Head: Eyes PERRLA, No xanthomas.   Normal cephalic and atramatic  Lungs: Clear bilaterally to auscultation and percussion. Heart: HRRR S1 S2, with soft S4 murmur.  Pulses are 2+ & equal.            No carotid bruit. No JVD.  No abdominal bruits. No femoral bruits. Abdomen: Bowel sounds are positive, abdomen soft and non-tender without masses or                  Hernia's noted. Msk:  Back normal, normal gait. Normal strength and tone for age. Extremities: No  clubbing, cyanosis or edema.  DP +1 Neuro: Alert and oriented X 3. Psych:  Good affect, responds appropriately :  ASSESSMENT AND PLAN

## 2010-09-16 NOTE — Assessment & Plan Note (Signed)
Some of the BP elevation in the setting of anxiety, however, her BP is not well controlled.  I will increase her Norvasc to 10mg  daily. I will change her Diovan HCT 320/12.5 to 320/25mg .  She will proceed with tooth extraction and take the medications prior to the procedure. She will take extra dose of Norvasc 5mg  today. Then start the new medication dosing.  BMET will be completed to evaluate kidney fx. We will see her in a week to evaluate response to treatment.  She is advised to avoid salt.

## 2010-09-16 NOTE — Patient Instructions (Signed)
**Note De-Identified  Obfuscation** Your physician recommends that you return for lab work in: today  Your physician has recommended you make the following change in your medication: increase Amlodipine to 10mg  daily and Diovan to 320/25mg  daily (Please take before you see dentist tomorrow)   Your physician recommends that you schedule a follow-up appointment in: 1 month

## 2010-09-17 LAB — BASIC METABOLIC PANEL
Calcium: 9.2 mg/dL (ref 8.4–10.5)
Glucose, Bld: 206 mg/dL — ABNORMAL HIGH (ref 70–99)
Sodium: 139 mEq/L (ref 135–145)

## 2010-09-21 ENCOUNTER — Emergency Department (HOSPITAL_COMMUNITY)
Admission: EM | Admit: 2010-09-21 | Discharge: 2010-09-21 | Disposition: A | Discharge status: Home / Self Care | Payer: Medicaid Other | Attending: Emergency Medicine | Admitting: Emergency Medicine

## 2010-09-21 ENCOUNTER — Emergency Department (HOSPITAL_COMMUNITY): Payer: Medicaid Other

## 2010-09-21 DIAGNOSIS — I10 Essential (primary) hypertension: Secondary | ICD-10-CM | POA: Insufficient documentation

## 2010-09-21 DIAGNOSIS — R0602 Shortness of breath: Secondary | ICD-10-CM | POA: Insufficient documentation

## 2010-09-21 DIAGNOSIS — H538 Other visual disturbances: Secondary | ICD-10-CM | POA: Insufficient documentation

## 2010-09-21 LAB — POCT I-STAT, CHEM 8
Calcium, Ion: 1.14 mmol/L (ref 1.12–1.32)
Glucose, Bld: 118 mg/dL — ABNORMAL HIGH (ref 70–99)
HCT: 35 % — ABNORMAL LOW (ref 36.0–46.0)
TCO2: 28 mmol/L (ref 0–100)

## 2010-09-25 ENCOUNTER — Encounter: Payer: Self-pay | Admitting: *Deleted

## 2010-09-28 ENCOUNTER — Ambulatory Visit: Payer: Medicaid Other | Admitting: Adult Health

## 2010-09-28 ENCOUNTER — Ambulatory Visit (INDEPENDENT_AMBULATORY_CARE_PROVIDER_SITE_OTHER): Payer: Medicaid Other

## 2010-09-28 VITALS — BP 149/103 | HR 95 | Wt 241.0 lb

## 2010-09-28 DIAGNOSIS — I10 Essential (primary) hypertension: Secondary | ICD-10-CM

## 2010-09-28 MED ORDER — HYDROCHLOROTHIAZIDE 25 MG PO TABS: 25.0000 mg | ORAL_TABLET | Freq: Every day | ORAL | Status: DC

## 2010-09-28 NOTE — Progress Notes (Signed)
**Note De-Identified  Obfuscation** S: Pt. arrives in office for a 2 week blood pressure check. B: On last OV with Harriet Pho, NP on 09-16-10 pt. was advised to increase Amlodipine to 10mg  daily and Diovan to 320/25.  A: Pt. states that she was [facility] by [facility] last week (09-21-10) and was advised to go ER for elevated BP. Pt. states that while in ER her BP came down to "around 150/100" and and was allowed to go home. She c/o fatigue and weakness. She states that she felt better while taking Benicar/HCT 40/12.5mg  daily R: Per K. Lawrence, NP pt. is advised to stop taking Diovan /HCT 320/25 and start taking Benicar 40mg  and HCTZ 25mg  daily (HCTZ faxed to [employer] [employer] Also, pt. is advised that we will contact her with any other recommendations from Joni Reining, NP. Pt. States she understands instructions given.

## 2010-09-29 ENCOUNTER — Telehealth: Payer: Self-pay

## 2010-09-29 MED ORDER — OLMESARTAN MEDOXOMIL 40 MG PO TABS: 40.0000 mg | ORAL_TABLET | Freq: Every day | ORAL | Status: DC

## 2010-09-29 MED ORDER — HYDROCHLOROTHIAZIDE 25 MG PO TABS: 25.0000 mg | ORAL_TABLET | Freq: Every day | ORAL | Status: DC

## 2010-09-29 NOTE — Assessment & Plan Note (Signed)
[facility]                          [facility]ICE NOTE   LATIFFANY, HARWICK                     MRN:          [id number]  DATE:10/08/2008                            DOB:          Feb 29, 1976    PRIMARY CARE:  [facility].   REASON FOR VISIT:  Followup hypertension.   HISTORY OF PRESENT ILLNESS:  [name] comes back in today having  missed her last visit.  Since I last saw her, she did follow up with  Neurology for assessment of obstructive sleep apnea and states that she  just recently has started on CPAP therapy.  I encouraged her to continue  this.  She reports compliance with her medications.  She has had no  problems with headache or chest pain.  She does state that she  occasionally feels swollen, but had no orthopnea or PND.  She is not  exercising and has not made any ground on weight loss.   ALLERGIES:  No known drug allergies.   MEDICATIONS:  1. Potassium chloride 10 mEq p.o. daily.  2. Lisinopril 40 mg p.o. daily.  3. Clonidine 0.2 mg p.o. b.i.d.  4. Aldactone 25 mg 2 tablets p.o. daily.  5. Labetalol 400 mg p.o. b.i.d.   REVIEW OF SYSTEMS:  As outlined above.  Otherwise reviewed and negative.   PHYSICAL EXAMINATION:  VITAL SIGNS:  Blood pressure initially 183/122,  rechecked 170/108, heart rate is 95, weight is 248 pounds, which is up 4  pounds from her last visit.  GENERAL:  This is an obese woman in no acute distress.  HEENT:  Conjunctivae are normal.  Oropharynx is clear.  NECK:  Supple.  No elevated jugular venous pressure.  No bruits.  LUNGS:  Clear without labored breathing.  CARDIAC:  Regular rate and rhythm.  No S3 gallop.  Soft apical systolic  murmur.  Indistinct PMI.  ABDOMEN:  Obese.  Cannot palpate liver edge.  Bowel sounds are present.  No tenderness noted.  EXTREMITIES:  No pitting edema.  Distal pulses are 2+.  SKIN:  Warm and dry.  MUSCULOSKELETAL:  No kyphosis noted.  NEUROPSYCHIATRIC:  The patient is alert and oriented x3.  Affect is  appropriate.   IMPRESSION AND RECOMMENDATIONS:  Hypertension, likely essential, and  difficult to control.  [name] has been diagnosed with obstructive  sleep apnea formally since I last saw her, and has just recently started  on CPAP.  I hope that this will have positive impact on her blood  pressure control.  She needs a followup BMET to assess potassium and  renal function on her present regimen.  I would also like to change her  clonidine to a 0.3 mg patch and advance labetalol to 800 mg p.o. b.i.d.  Other medicines will remain the same.  I will plan to see her back over  the next month.     [name], MD  Electronically Signed    SGM/MedQ  DD: 10/08/2008  DT: 10/09/2008  Job #: [id number]   cc:   [facility] Department

## 2010-09-29 NOTE — Assessment & Plan Note (Signed)
[facility]                          [facility] NOTE   Chelsey Santiago                     MRN:          [id number]  DATE:03/19/2008                            DOB:          [birth year]    PRIMARY CARE PHYSICIAN:  None.   REASON FOR VISIT:  Follow up hypertension.   HISTORY OF PRESENT ILLNESS:  Chelsey Santiago comes back in for a followup.  She remains significantly hypertensive.  She had a blood pressure check  done since her last visit and was found to be 169/108 at that time with  a heart rate of 86.  She has dyspnea on exertion, which has been chronic  since her presentation and has known left ventricular ejection fraction,  recently documented at 50-55% with diastolic dysfunction and mild to  moderate mitral regurgitation.  Today, we reviewed some medication  adjustments.  We talked about exercise and weight loss as well as sodium  restriction.  We also discussed the relationship between long-term  hypertension and congestive heart failure.   ALLERGIES:  No known drug allergies.   PRESENT MEDICATIONS:  1. Lasix 20 mg p.o. daily.  2. Potassium chloride 10 mEq p.o. daily.  3. Lisinopril 20 mg p.o. daily.  4. Coreg 12.5 mg p.o. b.i.d.  5. Imitrex p.r.n.   REVIEW OF SYSTEMS:  As per history of present illness.  No orthopnea,  PND, or syncope.  Otherwise negative.   PHYSICAL EXAMINATION:  VITAL SIGNS:  Blood pressure today is 170/110,  heart rate is in the 70s, and weight is 240.8 pounds.  GENERAL:  This is an obese woman in no acute distress.  HEENT:  Conjunctivae is normal.  Oropharynx is clear.  NECK:  Supple.  No elevated jugular venous pressure.  No loud bruits.  No thyromegaly is noted.  LUNGS:  Clear without labored breathing at rest.  CARDIAC:  Regular rate and rhythm.  No pathologic murmur or S3 gallop is  apparent.  ABDOMEN:  Obese and nontender.  EXTREMITIES:  Exhibit no frank pitting edema.  Distal pulses are  2+.  SKIN:  Warm and dry.  MUSCULOSKELETAL:  No kyphosis noted.  NEUROPSYCHIATRIC:  The patient alert and oriented x3.  Affect is  appropriate.   IMPRESSION AND RECOMMENDATIONS:  Uncontrolled hypertension, likely  essential in etiology.  At this point, I would like to discontinue Coreg  and start Bystolic 20 mg p.o. daily.  We will also increase lisinopril  to 40 mg p.o. daily, and I will change her Lasix to hydrochlorothiazide  25 mg p.o. daily.  She will remain on the potassium supplements.  A  blood pressure check with BMET will be obtained in 2 weeks, and I will  see her back next month.  Ultimately, I would like a followup  echocardiogram once her blood pressure is under better control to  reassess left ventricular function and degree of mitral regurgitation.  She will let me know in the interim if symptoms progress.     Chelsey Sidle, MD  Electronically Signed    SGM/MedQ  DD: 03/19/2008  DT: 03/19/2008  Job #:  [id number] 

## 2010-09-29 NOTE — Assessment & Plan Note (Signed)
[facility]                          [facility] NOTE   Chelsey Santiago, Chelsey Santiago                     MRN:          [id number]  DATE:02/16/2008                            DOB:          01-24-1976    PRIMARY CARE PHYSICIAN:  None.   REASON FOR VISIT:  Post-hospital followup.   HISTORY OF PRESENT ILLNESS:  Chelsey Santiago is a 35 year old woman that we  saw in consultation via Dr. Doyne Keel following presentation with  shortness of breath in the postpartum setting.  There was concern about  potential heart failure, although her echocardiogram ultimately  demonstrated a left ventricular ejection fraction of 50-55% with  diastolic dysfunction and also mild-to-moderate mitral regurgitation.  Mild tricuspid regurgitation was noted with increased right ventricular  systolic pressure of 41 as well.  Her medical therapy was adjusted to  include an ACE inhibitor and diuretic.  She was seen by Dr. Andee Lineman prior  to discharge with plans to follow up in the office and ultimately  consider a followup echocardiogram.  She reports compliance with her  medications outlined below.  She has NYHA class II, dyspnea on exertion  and no chest pain.  She continues to have lower extremity edema,  although states that is somewhat better.  Her blood pressure is clearly  not well controlled today.  Today's electrocardiogram shows sinus rhythm  at 80 beats per minute.   ALLERGIES:  No known drug allergies.   MEDICATIONS:  1. Lisinopril 10 mg p.o. daily.  2. Lasix 20 mg p.o. daily.  3. Potassium chloride 10 mEq p.o. daily.  4. Imitrex 50 mg p.r.n.   REVIEW OF SYSTEMS:  As per history of present illness.  No palpitations  or syncope.   PHYSICAL EXAMINATION:  VITAL SIGNS:  Blood pressure is 158/110, heart  rate is 80, weight is 239 pounds.  GENERAL:  This is an overweight woman in no acute distress.  HEENT:  Conjunctiva is normal.  Oropharynx is clear.  NECK:  Supple.   No elevated jugular venous pressure.  No audible bruits.  No thyromegaly is noted.  LUNGS:  Clear without labored breathing at rest.  CARDIAC:  Regular rate and rhythm.  No obvious S3, soft S4; however,  soft systolic murmur at the base.  ABDOMEN:  Soft, nontender with normoactive bowel sounds.  EXTREMITIES:  Exhibit trace to 1+ edema around the ankles.  Distal  pulses 1 to 2+.  SKIN:  Warm and dry.  MUSCULOSKELETAL:  No kyphosis noted.  NEUROPSYCHIATRIC:  The patient alert and oriented x3.  Affect is  appropriate.   IMPRESSION AND RECOMMENDATIONS:  1. Hypertension, likely essential, and not well controlled as yet.      Our plan will be to increase lisinopril to 20 mg daily, add Coreg      6.25 mg p.o. b.i.d., and continue Lasix.  We will plan a follow up      BMET over the next 2 weeks with a blood pressure check.  She will      be seen back in the office over the next month for further  medication titration.  I encouraged her to continue looking for      primary care physician as well.  2. Mild-to-moderate mitral regurgitation in the setting of      uncontrolled hypertension and diastolic dysfunction.  Ultimately,      we will plan a follow up 2D echocardiogram once her blood pressure      is better controlled.     Chelsey Sidle, MD  Electronically Signed    SGM/MedQ  DD: 02/16/2008  DT: 02/17/2008  Job #: [id number]

## 2010-09-29 NOTE — Assessment & Plan Note (Signed)
[facility]                          [facility]   [name], [name]                     MRN:          [id number]  DATE:06/06/2008                            DOB:          04/07/1976    PRIMARY CARE PHYSICIAN:  [facility] Department.   REASON FOR VISIT:  Followup hypertension.   HISTORY OF PRESENT ILLNESS:  [name] presents for a 47-month visit.  She continues to have uncontrolled high blood pressure, but reports  compliance with her medications.  She has NYHA class II dyspnea on  exertion, but no chest pain, and previously documented mild-to-moderate  mitral regurgitation with low normal ejection fraction in the setting of  hypertension.  She also reports a family history of heart failure.  [name] was referred following her last visit for an ApneaLink  monitor which was abnormal showing some desaturations lower than 88%.  We talked about the possibility of further evaluation for obstructive  sleep apnea.  She did have followup labs after her last medication  adjustments showing a BUN and creatinine of 9 and 0.8, sodium of 138,  and a potassium of 3.7.  These numbers have been fairly stable.   ALLERGIES:  No known drug allergies.   PRESENT MEDICATIONS:  1. Potassium chloride 10 mEq p.o. daily.  2. Lisinopril 40 mg p.o. daily.  3. Aldactone 25 mg p.o. daily.  4. Bystolic 40 mg p.o. daily.  5. Imitrex p.r.n.   REVIEW OF SYSTEMS:  As outlined above.  Otherwise, negative.   PHYSICAL EXAMINATION:  VITAL SIGNS:  Blood pressure 172/110, heart rate  76, weight 244 pounds, oxygen saturation 98% on room air.  GENERAL:  This is an obese woman in no acute distress without active  chest pain or breathlessness at rest.  HEENT:  Conjunctiva is normal.  Oropharynx is clear.  NECK:  Supple.  No elevated jugular venous pressure.  No loud bruits.  No thyromegaly is noted  LUNGS:  Clear without labored breathing at  rest.  CARDIAC:  Regular rate and rhythm.  No S3 gallop.  Soft apical systolic  murmur.  PMI indistinct.  ABDOMEN:  Obese.  No obvious palpable liver edge.  Bowel sounds present.  EXTREMITIES:  No frank pitting edema.  Distal pulses 2+.  SKIN:  Warm and dry.  MUSCULOSKELETAL:  No kyphosis noted.  NEUROPSYCHIATRIC:  Alert and oriented x3.  Affect is appropriate.   IMPRESSION AND RECOMMENDATIONS:  Hypertension, suboptimally controlled  so far despite medication adjustments and titration.  She is not  responding well at all to [facility], despite maximizing dose.  She also  has possible underlying sleep apnea.  We will plan to make a referral to  Dr. Benson Setting to discuss formal sleep testing.  As far as her medical  regimen is concerned, I have asked her to stop Bystolic and transition  to labetalol 200 mg p.o. b.i.d. which can be gradually up titrated as  needed.  We will also place her on clonidine 0.2 mg p.o. b.i.d. and  increase her Aldactone to 50 mg daily.  We will have her follow  up for a  blood pressure check in 2 weeks with a BMET at that time, and we will  see her back clinically over the next month.     [name], MD  Electronically Signed    SGM/MedQ  DD: 06/06/2008  DT: 06/06/2008  Job #: [id number]

## 2010-09-29 NOTE — Assessment & Plan Note (Signed)
[facility]                          [facility] NOTE   NATLIE, ASFOUR                     MRN:          [id number]  DATE:04/17/2008                            DOB:          [birth year]    PRIMARY CARDIOLOGIST:  [name], MD   REASON FOR VISIT:  Scheduled followup.  Please refer to Dr. Ival Bible  recent note of March 19, 2008, for full details.   [name] returns for ongoing monitoring and management of  refractory, essential hypertension.  Her meds were recently adjusted  with substitution of Coreg with Bystolic 20 mg daily, substitution of  Lasix with HCTZ 25 daily, and up titration of lisinopril to the current  dose of 40 mg daily.  A recent scheduled office blood pressure check  showed persistent elevation, with a reading of 169/108.   Clinically, she reports no significant change, other than a persistent  dry cough and persistent headaches, for which she takes Imitrex.  She  denies any visual disturbances.  She reports compliance with refraining  from added salt in her diet.   A recent, followup metabolic profile was entirely within normal limits.   [name] also complains of somnolence and generalized fatigue  throughout the day.  She takes naps on occasion.  She is unaware if she  snores at night, but suggests that she does not sleep through the  evening.  There is even some suggestion of possible PND.   CURRENT MEDICATIONS:  1. Lisinopril 40 daily.  2. Hydrochlorothiazide 25 daily.  3. Bystolic 20 daily.  4. KCl 10 daily.   PHYSICAL EXAMINATION:  VITAL SIGNS:  Blood pressure 182/112 initially,  followup 170/110.  Pulse 68, regular and weight 246 (up 6).  GENERAL:  A 35 year old female, morbidly obese, sitting upright, no  distress.  HEENT:  Normocephalic and atraumatic.  NECK:  Palpable carotid pulses, without bruits; no JVD at 90 degrees.  LUNGS:  Diminished breath sounds at bases, without  crackles or wheezes.  HEART:  Regular rate and rhythm.  No significant murmurs.  No rubs.  ABDOMEN:  Protuberant, intact bowel sounds.  EXTREMITIES:  No significant edema.  NEURO:  Flat affect, but no focal deficit.   IMPRESSION:  1. Refractory hypertension.  2. Normal left ventricular function.  3. Valvular disease.      a.     Mild/moderate mitral regurgitation by 2D echo, September       2009.  4. Morbid obesity.  5. Fatigue, somnolence.      a.     Rule out obstructive sleep apnea.  6. Diastolic heart failure.   PLAN:  1. Readjustment of her antihypertensive regimen as follows:      Uptitration of Bystolic to 40 mg daily, discontinuation of      hydrochlorothiazide in favor of Aldactone 25 mg daily.  If this      does not result in significant improvement, then we will consider      adding Norvasc.  We will also consider referring her to a      [facility], for further recommendations and  ongoing management.  2. Followup BMET in 1 week, for monitoring of electrolytes and renal      function following the addition of Aldactone.  3. Return to clinic visit with myself and Dr. Diona Browner in 1 month for      review of above, and further recommendations.  4. ApneaLink screening to rule out obstructive sleep apnea.      Rozell Searing, PA-C  Electronically Signed      [name], MD  Electronically Signed   GS/MedQ  DD: 04/17/2008  DT: 04/18/2008  Job #: [id number]   cc:   Vertis Kelch, NP

## 2010-10-06 ENCOUNTER — Telehealth: Payer: Self-pay | Admitting: Cardiology

## 2010-10-06 NOTE — Telephone Encounter (Signed)
DR JENSEN OFFICE IS NEEDING THE LAST BP LOG FROM LAST VISIT SINCE IT HAD COME DOWN. SHE STATES THAT BP HAS BEEN RUNNING GOOD SINCE MED CHANGE.

## 2010-10-08 NOTE — Telephone Encounter (Signed)
Faxed nurse visit and office visit to Dr. Randa Evens office per pt request

## 2010-10-19 ENCOUNTER — Ambulatory Visit: Payer: Medicaid Other | Admitting: Cardiology

## 2010-10-20 DIAGNOSIS — I340 Nonrheumatic mitral (valve) insufficiency: Secondary | ICD-10-CM

## 2010-10-20 DIAGNOSIS — Z820 Family history of epilepsy and other diseases of the nervous system: Secondary | ICD-10-CM

## 2010-10-20 DIAGNOSIS — D649 Anemia, unspecified: Secondary | ICD-10-CM

## 2010-10-21 ENCOUNTER — Ambulatory Visit: Payer: Medicaid Other | Admitting: Cardiology

## 2010-10-27 ENCOUNTER — Emergency Department (HOSPITAL_COMMUNITY)
Admission: EM | Admit: 2010-10-27 | Discharge: 2010-10-27 | Disposition: A | Discharge status: Home / Self Care | Payer: Medicaid Other | Attending: Emergency Medicine | Admitting: Emergency Medicine

## 2010-10-27 DIAGNOSIS — M545 Low back pain, unspecified: Secondary | ICD-10-CM | POA: Insufficient documentation

## 2010-10-27 DIAGNOSIS — Z79899 Other long term (current) drug therapy: Secondary | ICD-10-CM | POA: Insufficient documentation

## 2010-10-27 DIAGNOSIS — I10 Essential (primary) hypertension: Secondary | ICD-10-CM | POA: Insufficient documentation

## 2010-10-28 ENCOUNTER — Ambulatory Visit: Payer: Medicaid Other | Admitting: Adult Health

## 2010-10-29 ENCOUNTER — Encounter: Payer: Self-pay | Admitting: Adult Health

## 2010-10-29 ENCOUNTER — Ambulatory Visit (INDEPENDENT_AMBULATORY_CARE_PROVIDER_SITE_OTHER): Payer: Medicaid Other | Admitting: Adult Health

## 2010-10-29 VITALS — BP 146/101 | HR 121 | Ht 66.0 in | Wt 240.0 lb

## 2010-10-29 DIAGNOSIS — I10 Essential (primary) hypertension: Secondary | ICD-10-CM

## 2010-10-29 MED ORDER — POTASSIUM CHLORIDE CRYS ER 20 MEQ PO TBCR: EXTENDED_RELEASE_TABLET | ORAL | Status: DC

## 2010-10-29 MED ORDER — FUROSEMIDE 20 MG PO TABS: 20.0000 mg | ORAL_TABLET | Freq: Every day | ORAL | Status: DC | PRN

## 2010-10-29 NOTE — Assessment & Plan Note (Signed)
She is requesting a low dose of Lasix as she said that in the past this helped her BP more than anything else.  I will give her a low dose of 20mg  to take prn edema or BP elevation.  I do not want to keep her on this daily as she is also on HCTZ and to avoid dehydration.  Should this be effective may have this be a daily dose.  She will be given potassium replacement. She will come back in 4 days for BP check. Will follow-up with lab work in a couple of weeks.  If BP is stable will release for dental extractions.  May need to do further work-up with 24 hour urine for creatnine clearance and for phenochromocytoma evaluation. Will follow

## 2010-10-29 NOTE — Progress Notes (Signed)
HPI:  [name] is a 35 y/o AAF we are following for ongoing issues with hypertension that has been very difficult to control. She has had many BP medication adjustments and changes with some improvement, but not at optimal levels. She is due for some dental extractions but has not been able to have them because her BP has been remaining elevated and the dentist will not do her surgery until she is normotensive. She states she is medically compliant and is trying to stay away from salt. She has diastolic dsyfx per echo.  Not on File  Current Outpatient Prescriptions  Medication Sig Dispense Refill  . ALPRAZolam (XANAX) 1 MG tablet Take 1 mg by mouth at bedtime as needed.        Marland Kitchen amLODipine (NORVASC) 10 MG tablet Take 1 tablet (10 mg total) by mouth daily.  30 tablet  3  . aspirin 81 MG tablet Take 81 mg by mouth daily.        . cloNIDine (CATAPRES) 0.2 MG tablet Take 0.2 mg by mouth 2 (two) times daily.        Marland Kitchen escitalopram (LEXAPRO) 10 MG tablet Take 10 mg by mouth daily.        . hydrochlorothiazide 25 MG tablet Take 1 tablet (25 mg total) by mouth daily.  90 tablet  0  . labetalol (NORMODYNE) 200 MG tablet Take 200 mg by mouth 2 (two) times daily.        Marland Kitchen olmesartan (BENICAR) 40 MG tablet Take 1 tablet (40 mg total) by mouth daily.  90 tablet  0  . furosemide (LASIX) 20 MG tablet Take 1 tablet (20 mg total) by mouth daily as needed.  30 tablet  3  . potassium chloride SA (K-DUR,KLOR-CON) 20 MEQ tablet With furosemide   30 tablet  3  . DISCONTD: valsartan-hydrochlorothiazide (DIOVAN-HCT) 320-12.5 MG per tablet Take 1 tablet by mouth daily.          Past Medical History  Diagnosis Date  . Hypertension   . Hypertensive heart disease   . FHx: migraine headaches   . Anemia   . Mitral regurgitation     Past Surgical History  Procedure Date  . Breast reduction surgery     [id number] of systems complete and found to be negative unless listed above PHYSICAL EXAM BP 146/101   Pulse 121  Ht 5\' 6"  (1.676 m)  Wt 240 lb (108.863 kg)  BMI 38.74 kg/m2  General: Well developed, well nourished, in no acute distress  Lungs: Clear bilaterally to auscultation and percussion. Heart: HRRR S1 S2, with soft S4 murmur.  Pulses are 2+ & equal.            No carotid bruit. No JVD.  No abdominal bruits. No femoral bruits. Extremities: No clubbing, cyanosis or edema.  DP +1 Neuro: Alert and oriented X 3. Psych:  Good affect, responds appropriately  ASSESSMENT AND PLAN

## 2010-10-29 NOTE — Patient Instructions (Signed)
Your physician recommends that you schedule a follow-up appointment [id number] for bp check Your physician has recommended you make the following change in your medication: benicar/hct samples 40/25 Furosemide 20mg  daily as needed for swelling, potassium daily as needed take with furosemide as needed

## 2010-11-02 ENCOUNTER — Emergency Department (HOSPITAL_COMMUNITY)
Admission: EM | Admit: 2010-11-02 | Discharge: 2010-11-02 | Disposition: A | Discharge status: Home / Self Care | Payer: Medicaid Other | Attending: Emergency Medicine | Admitting: Emergency Medicine

## 2010-11-02 DIAGNOSIS — F329 Major depressive disorder, single episode, unspecified: Secondary | ICD-10-CM | POA: Insufficient documentation

## 2010-11-02 DIAGNOSIS — F3289 Other specified depressive episodes: Secondary | ICD-10-CM | POA: Insufficient documentation

## 2010-11-02 DIAGNOSIS — I10 Essential (primary) hypertension: Secondary | ICD-10-CM | POA: Insufficient documentation

## 2010-11-02 DIAGNOSIS — I517 Cardiomegaly: Secondary | ICD-10-CM | POA: Insufficient documentation

## 2010-11-02 DIAGNOSIS — F411 Generalized anxiety disorder: Secondary | ICD-10-CM | POA: Insufficient documentation

## 2010-11-02 DIAGNOSIS — K089 Disorder of teeth and supporting structures, unspecified: Secondary | ICD-10-CM | POA: Insufficient documentation

## 2010-11-02 DIAGNOSIS — G43909 Migraine, unspecified, not intractable, without status migrainosus: Secondary | ICD-10-CM | POA: Insufficient documentation

## 2010-11-05 ENCOUNTER — Ambulatory Visit: Payer: Medicaid Other | Admitting: Adult Health

## 2010-11-13 ENCOUNTER — Telehealth: Payer: Self-pay | Admitting: *Deleted

## 2010-11-13 NOTE — Telephone Encounter (Signed)
Spoke with pt, unable to change her benicar to anything generic B/p has been elevated for last 3 months This is the only medication that has brought her bp under control

## 2010-11-20 ENCOUNTER — Emergency Department (HOSPITAL_COMMUNITY)
Admission: EM | Admit: 2010-11-20 | Discharge: 2010-11-20 | Disposition: A | Discharge status: Home / Self Care | Payer: Medicaid Other | Attending: Emergency Medicine | Admitting: Emergency Medicine

## 2010-11-20 DIAGNOSIS — I10 Essential (primary) hypertension: Secondary | ICD-10-CM | POA: Insufficient documentation

## 2010-11-20 DIAGNOSIS — F3289 Other specified depressive episodes: Secondary | ICD-10-CM | POA: Insufficient documentation

## 2010-11-20 DIAGNOSIS — F329 Major depressive disorder, single episode, unspecified: Secondary | ICD-10-CM | POA: Insufficient documentation

## 2010-11-20 DIAGNOSIS — F411 Generalized anxiety disorder: Secondary | ICD-10-CM | POA: Insufficient documentation

## 2010-11-20 DIAGNOSIS — K089 Disorder of teeth and supporting structures, unspecified: Secondary | ICD-10-CM | POA: Insufficient documentation

## 2010-11-20 DIAGNOSIS — Z79899 Other long term (current) drug therapy: Secondary | ICD-10-CM | POA: Insufficient documentation

## 2011-01-25 ENCOUNTER — Encounter (HOSPITAL_COMMUNITY): Payer: Self-pay | Admitting: *Deleted

## 2011-01-25 ENCOUNTER — Emergency Department (HOSPITAL_COMMUNITY)
Admission: EM | Admit: 2011-01-25 | Discharge: 2011-01-25 | Disposition: A | Discharge status: Home / Self Care | Payer: Medicaid Other | Attending: Emergency Medicine | Admitting: Emergency Medicine

## 2011-01-25 ENCOUNTER — Emergency Department (HOSPITAL_COMMUNITY): Payer: Medicaid Other

## 2011-01-25 DIAGNOSIS — I119 Hypertensive heart disease without heart failure: Secondary | ICD-10-CM | POA: Insufficient documentation

## 2011-01-25 DIAGNOSIS — Y92009 Unspecified place in unspecified non-institutional (private) residence as the place of occurrence of the external cause: Secondary | ICD-10-CM | POA: Insufficient documentation

## 2011-01-25 DIAGNOSIS — M1710 Unilateral primary osteoarthritis, unspecified knee: Secondary | ICD-10-CM | POA: Insufficient documentation

## 2011-01-25 DIAGNOSIS — Z9181 History of falling: Secondary | ICD-10-CM

## 2011-01-25 DIAGNOSIS — I059 Rheumatic mitral valve disease, unspecified: Secondary | ICD-10-CM | POA: Insufficient documentation

## 2011-01-25 DIAGNOSIS — W19XXXA Unspecified fall, initial encounter: Secondary | ICD-10-CM | POA: Insufficient documentation

## 2011-01-25 DIAGNOSIS — Z862 Personal history of diseases of the blood and blood-forming organs and certain disorders involving the immune mechanism: Secondary | ICD-10-CM | POA: Insufficient documentation

## 2011-01-25 DIAGNOSIS — M1990 Unspecified osteoarthritis, unspecified site: Secondary | ICD-10-CM

## 2011-01-25 MED ORDER — IBUPROFEN 400 MG PO TABS: 400.0000 mg | ORAL_TABLET | Freq: Four times a day (QID) | ORAL | Status: AC | PRN

## 2011-01-25 MED ORDER — HYDROCODONE-ACETAMINOPHEN 5-325 MG PO TABS: 2.0000 | ORAL_TABLET | ORAL | Status: AC | PRN

## 2011-01-25 NOTE — ED Notes (Signed)
States fell yesterday morning in shower - c/o pain/swelling to right knee and pain to right hand with gripping.  Denies hitting head/loc.

## 2011-01-25 NOTE — ED Provider Notes (Signed)
History   Chart scribed for Carleene Cooper III, MD by Enos Fling; the patient was seen in room APA11/APA11; this patient's care was started at 9:29 AM.    CSN: [id number] Arrival date & time: 01/25/2011  9:19 AM  Chief Complaint  Patient presents with  . Fall   HPI Chelsey Santiago is a 35 y.o. female who presents to the Emergency Department s/p fall. Pt reports she slipped and fell in shower yesterday morning, landing on her right knee and using her left hand to try to catch herself. Pt c/o pain to right knee and left hand since fall. Pain in hand is worse with extension of thumb and knee pain is worse with walking. Reports mild swelling and bruising to right knee. Pt has been trying tylenol with no relief. No numbness, tingling, or weakness to extremities. No back or neck pain. No head injury or LOC with fall.    Past Medical History  Diagnosis Date  . Hypertension   . Hypertensive heart disease   . FHx: migraine headaches   . Anemia   . Mitral regurgitation     Past Surgical History  Procedure Date  . Breast reduction surgery     No family history on file.  History  Substance Use Topics  . Smoking status: Never Smoker   . Smokeless tobacco: Never Used  . Alcohol Use: No  EtOH occasionally  OB History    Grav Para Term Preterm Abortions TAB SAB Ect Mult Living                 Previous Medications   ALPRAZOLAM (XANAX) 1 MG TABLET    Take 1 mg by mouth at bedtime as needed. For anxiety   AMLODIPINE (NORVASC) 10 MG TABLET    Take 1 tablet (10 mg total) by mouth daily.   ASPIRIN 81 MG TABLET    Take 81 mg by mouth daily.     CLONIDINE (CATAPRES) 0.2 MG TABLET    Take 0.2 mg by mouth 2 (two) times daily.     ESCITALOPRAM (LEXAPRO) 10 MG TABLET    Take 10 mg by mouth daily.     FUROSEMIDE (LASIX) 20 MG TABLET    Take 1 tablet (20 mg total) by mouth daily as needed.   HYDROCHLOROTHIAZIDE 25 MG TABLET    Take 1 tablet (25 mg total) by mouth daily.   LABETALOL  (NORMODYNE) 200 MG TABLET    Take 200 mg by mouth 2 (two) times daily.     OLMESARTAN (BENICAR) 40 MG TABLET    Take 1 tablet (40 mg total) by mouth daily.   POTASSIUM CHLORIDE SA (K-DUR,KLOR-CON) 20 MEQ TABLET    With furosemide      Allergies as of 01/25/2011  . (No Known Allergies)     Review of Systems  Constitutional: Negative for fever and unexpected weight change.  HENT: Negative for ear pain and sore throat.   Eyes: Negative for visual disturbance.  Respiratory: Negative for cough.   Cardiovascular: Negative for chest pain.  Gastrointestinal: Negative for vomiting and diarrhea.  Genitourinary: Negative.        LNMP 12/30/10  Musculoskeletal: Negative for myalgias and back pain.       Injury to left hand and right knee  Skin: Negative for rash.  Neurological: Negative for dizziness, seizures and syncope.  Hematological: Negative.   Psychiatric/Behavioral: Negative.     Physical Exam  BP 173/114  Pulse 94  Temp(Src) 98 F (36.7  C) (Oral)  Resp 16  Ht 5\' 6"  (1.676 m)  Wt 233 lb 3 oz (105.773 kg)  BMI 37.64 kg/m2  SpO2 100%  Physical Exam  Nursing note and vitals reviewed. Constitutional: She is oriented to person, place, and time. She appears well-developed and well-nourished. No distress.  HENT:  Head: Normocephalic.  Mouth/Throat: Mucous membranes are normal.  Eyes: Conjunctivae are normal.  Neck: Normal range of motion. Neck supple. No tracheal tenderness and no muscular tenderness present.  Cardiovascular: Normal rate, regular rhythm and intact distal pulses.  Exam reveals no gallop and no friction rub.   No murmur heard. Pulmonary/Chest: Effort normal and breath sounds normal. She has no wheezes. She has no rales.  Abdominal: Soft. There is no tenderness.  Musculoskeletal: Normal range of motion. She exhibits tenderness. She exhibits no edema.       tenderness to palmar aspect of left hand with reproducible pain with extension of left thumb; minimal diffuse  tenderness to right knee, no obvious swelling or effusion; FROM right knee and left hand; Entire spine nontender  Neurological: She is alert and oriented to person, place, and time.       Normal motor and sensation to RLE and LUE  Skin: Skin is warm and dry. No rash noted.  Psychiatric: She has a normal mood and affect.   Procedures - none  OTHER DATA REVIEWED: Nursing notes and vital signs reviewed. Prior records reviewed: multiple visits to ED in past 6 months for dental pain, back pain, and chest pain.    LABS / RADIOLOGY: Dg Knee Complete 4 Views Right  01/25/2011  *RADIOLOGY REPORT*  Clinical Data: Pain and bruising right knee post fall  RIGHT KNEE - COMPLETE 4+ VIEW  Comparison: None  Findings: Osseous mineralization normal. Tricompartmental osteoarthritic changes with joint space narrowing marginal spur formation, advance for age. Small calcified loose body at posterior joint line on lateral view, likely lateral compartment. No acute fracture, dislocation, or bone destruction. No definite knee joint effusion. Infrapatellar soft tissue swelling identified anteriorly.  IMPRESSION: Tricompartmental osteoarthritic changes, pronounced for age. No acute bony abnormalities.  Original Report Authenticated By: Lollie Marrow, M.D.   Dg Hand Complete Left  01/25/2011  *RADIOLOGY REPORT*  Clinical Data: Pain at first webspace and left thumb post fall 1 day ago  LEFT HAND - COMPLETE 3+ VIEW  Comparison: None  Findings: Bone mineralization normal. Joint spaces preserved. No fracture, dislocation, or bone destruction.  IMPRESSION: Normal exam.  Original Report Authenticated By: Lollie Marrow, M.D.    ED COURSE:  11:08 AM X-rays of right knee show significant DJD.  Rx Ibuprofen, hydrocodone-acetaminophen.  R hand x-rays negative.   IMPRESSION: 1. History of fall   2. Osteoarthritis      PLAN: discharge All results reviewed and discussed with pt, questions answered, pt agreeable with  plan.   CONDITION ON DISCHARGE: stable   MEDS GIVEN IN ED: none  DISCHARGE MEDICATIONS: New Prescriptions   HYDROCODONE-ACETAMINOPHEN (NORCO) 5-325 MG PER TABLET    Take 2 tablets by mouth every 4 (four) hours as needed for pain.   IBUPROFEN (ADVIL,MOTRIN) 400 MG TABLET    Take 1 tablet (400 mg total) by mouth every 6 (six) hours as needed for pain.     SCRIBE ATTESTATION: I personally performed the services described in this documentation, which was scribed in my presence. The recorded information has been reviewed and considered. Osvaldo Human, MD      Osvaldo Human, MD  01/25/11 1114 

## 2011-02-19 LAB — URINALYSIS, ROUTINE W REFLEX MICROSCOPIC
Bilirubin Urine: NEGATIVE
Hgb urine dipstick: NEGATIVE
Ketones, ur: NEGATIVE
Nitrite: NEGATIVE
Urobilinogen, UA: 0.2

## 2011-02-19 LAB — URINE MICROSCOPIC-ADD ON

## 2011-02-19 LAB — URINE CULTURE: Colony Count: >=100000

## 2011-02-19 LAB — PREGNANCY, URINE: Preg Test, Ur: POSITIVE

## 2011-03-02 ENCOUNTER — Telehealth: Payer: Self-pay | Admitting: Adult Health

## 2011-03-02 NOTE — Telephone Encounter (Signed)
Please call patient regarding f/u appt and samples of Benicar.  There is no mention of f/u in [facility].  / tg

## 2011-03-11 ENCOUNTER — Ambulatory Visit: Payer: Medicaid Other | Admitting: Adult Health

## 2011-03-15 ENCOUNTER — Emergency Department (HOSPITAL_COMMUNITY): Payer: Medicaid Other

## 2011-03-15 ENCOUNTER — Encounter (HOSPITAL_COMMUNITY): Payer: Self-pay | Admitting: *Deleted

## 2011-03-15 ENCOUNTER — Emergency Department (HOSPITAL_COMMUNITY)
Admission: EM | Admit: 2011-03-15 | Discharge: 2011-03-15 | Disposition: A | Discharge status: Home / Self Care | Payer: Medicaid Other | Attending: Emergency Medicine | Admitting: Emergency Medicine

## 2011-03-15 DIAGNOSIS — I119 Hypertensive heart disease without heart failure: Secondary | ICD-10-CM | POA: Insufficient documentation

## 2011-03-15 DIAGNOSIS — M25569 Pain in unspecified knee: Secondary | ICD-10-CM

## 2011-03-15 DIAGNOSIS — I10 Essential (primary) hypertension: Secondary | ICD-10-CM | POA: Insufficient documentation

## 2011-03-15 DIAGNOSIS — Z7982 Long term (current) use of aspirin: Secondary | ICD-10-CM | POA: Insufficient documentation

## 2011-03-15 DIAGNOSIS — R609 Edema, unspecified: Secondary | ICD-10-CM | POA: Insufficient documentation

## 2011-03-15 DIAGNOSIS — Z79899 Other long term (current) drug therapy: Secondary | ICD-10-CM | POA: Insufficient documentation

## 2011-03-15 DIAGNOSIS — I059 Rheumatic mitral valve disease, unspecified: Secondary | ICD-10-CM | POA: Insufficient documentation

## 2011-03-15 MED ORDER — HYDROCODONE-ACETAMINOPHEN 5-325 MG PO TABS: ORAL_TABLET | ORAL | Status: AC

## 2011-03-15 NOTE — ED Notes (Signed)
Pt out of the room to the bathroom.  Pt states she has been waiting a long time.  Apologized to pt for long wait.  Angelina Ok

## 2011-03-15 NOTE — ED Provider Notes (Signed)
History     CSN: [id number] Arrival date & time: 03/15/2011 12:12 PM   First MD Initiated Contact with Patient 03/15/11 1348      Chief Complaint  Patient presents with  . Knee Pain    (Consider location/radiation/quality/duration/timing/severity/associated sxs/prior treatment) HPI Comments: Patient c/o bilateral knee pain for 2 weeks.  States pain has been increasing.  Also reports swelling to the left knee.  Reports hx of similar symptoms and told by her previus orthopedic that she had "severe arthritis in her knees".  She also reports hx of fall 2 weeks ago.  Patient is a 35 y.o. female presenting with knee pain. The history is provided by the patient.  Knee Pain This is a new problem. The current episode started 1 to 4 weeks ago. The problem occurs constantly. The problem has been gradually worsening. Associated symptoms include arthralgias and joint swelling. Pertinent negatives include no chest pain, chills, fatigue, fever, myalgias, neck pain, numbness, rash, sore throat, urinary symptoms or weakness. The symptoms are aggravated by walking and standing. She has tried nothing for the symptoms. The treatment provided no relief.    Past Medical History  Diagnosis Date  . Hypertension   . Hypertensive heart disease   . FHx: migraine headaches   . Anemia   . Mitral regurgitation     Past Surgical History  Procedure Date  . Breast reduction surgery     History reviewed. No pertinent family history.  History  Substance Use Topics  . Smoking status: Never Smoker   . Smokeless tobacco: Never Used  . Alcohol Use: No    OB History    Grav Para Term Preterm Abortions TAB SAB Ect Mult Living                  Review of Systems  Constitutional: Negative for fever, chills and fatigue.  HENT: Negative for sore throat, trouble swallowing, neck pain and neck stiffness.   Respiratory: Negative for shortness of breath.   Cardiovascular: Negative for chest pain.    Genitourinary: Negative for dysuria, hematuria and flank pain.  Musculoskeletal: Positive for joint swelling and arthralgias. Negative for myalgias, back pain and gait problem.  Skin: Negative.  Negative for rash.  Neurological: Negative for dizziness, weakness and numbness.  Hematological: Does not bruise/bleed easily.    Allergies  Review of patient's allergies indicates no known allergies.  Home Medications   Current Outpatient Rx  Name Route Sig Dispense Refill  . ALPRAZOLAM 1 MG PO TABS Oral Take 1 mg by mouth at bedtime as needed. For anxiety    . AMLODIPINE BESYLATE 10 MG PO TABS Oral Take 1 tablet (10 mg total) by mouth daily. 30 tablet 3  . ASPIRIN 81 MG PO TABS Oral Take 81 mg by mouth daily.      Marland Kitchen ESCITALOPRAM OXALATE 10 MG PO TABS Oral Take 10 mg by mouth daily.      . FUROSEMIDE 20 MG PO TABS Oral Take 1 tablet (20 mg total) by mouth daily as needed. 30 tablet 3  . LABETALOL HCL 200 MG PO TABS Oral Take 200 mg by mouth 2 (two) times daily.      Marland Kitchen OLMESARTAN MEDOXOMIL-HCTZ 40-25 MG PO TABS Oral Take 1 tablet by mouth daily.      Marland Kitchen POTASSIUM CHLORIDE CRYS CR 20 MEQ PO TBCR  With furosemide  30 tablet 3  . TRAZODONE HCL 100 MG PO TABS Oral Take 100 mg by mouth at bedtime.      Marland Kitchen  CLONIDINE HCL 0.2 MG PO TABS Oral Take 0.2 mg by mouth 2 (two) times daily.      Marland Kitchen HYDROCHLOROTHIAZIDE 25 MG PO TABS Oral Take 1 tablet (25 mg total) by mouth daily. 90 tablet 0  . HYDROCODONE-ACETAMINOPHEN 5-325 MG PO TABS  Take one tab po q 4-6 hrs prn pain 20 tablet 0  . OLMESARTAN MEDOXOMIL 40 MG PO TABS Oral Take 1 tablet (40 mg total) by mouth daily. 90 tablet 0    BP 151/93  Pulse 97  Temp(Src) 98.4 F (36.9 C) (Oral)  Resp 20  Ht 5\' 6"  (1.676 m)  Wt 225 lb (102.059 kg)  BMI 36.32 kg/m2  SpO2 100%  LMP 02/22/2011  Physical Exam  Nursing note and vitals reviewed. Constitutional: She is oriented to person, place, and time. She appears well-developed and well-nourished. No distress.   HENT:  Head: Normocephalic and atraumatic.  Mouth/Throat: Oropharynx is clear and moist.  Neck: Normal range of motion. Neck supple.  Cardiovascular: Normal rate, regular rhythm and normal heart sounds.   Pulmonary/Chest: Effort normal and breath sounds normal. No respiratory distress. She exhibits no tenderness.  Musculoskeletal: Normal range of motion. She exhibits edema and tenderness.       Right knee: She exhibits bony tenderness. She exhibits no swelling, no effusion, no erythema, normal alignment, normal patellar mobility and normal meniscus. tenderness found. Lateral joint line tenderness noted. No patellar tendon tenderness noted.       Legs: Lymphadenopathy:    She has no cervical adenopathy.  Neurological: She is alert and oriented to person, place, and time. No cranial nerve deficit. She exhibits normal muscle tone. Coordination normal.  Skin: Skin is warm and dry.    ED Course  ORTHOPEDIC INJURY TREATMENT Date/Time: 03/15/2011 4:44 PM Performed by: Trisha Mangle, Oluwatosin Bracy L. Authorized by: Maxwell Caul Consent: Verbal consent obtained. Written consent not obtained. Consent given by: patient Patient understanding: patient states understanding of the procedure being performed Patient consent: the patient's understanding of the procedure matches consent given Procedure consent: procedure consent matches procedure scheduled Imaging studies: imaging studies available Patient identity confirmed: verbally with patient Time out: Immediately prior to procedure a "time out" was called to verify the correct patient, procedure, equipment, support staff and site/side marked as required. Injury location: knee Location details: right knee Injury type: soft tissue Pre-procedure neurovascular assessment: neurovascularly intact Pre-procedure distal perfusion: normal Pre-procedure neurological function: normal Pre-procedure range of motion: normal Local anesthesia used: no Patient  sedated: no Immobilization: brace Splint type: knee immobilizer. Post-procedure neurovascular assessment: post-procedure neurovascularly intact Post-procedure distal perfusion: normal Post-procedure neurological function: normal Post-procedure range of motion: normal Patient tolerance: Patient tolerated the procedure well with no immediate complications.   (including critical care time)  Dg Knee Complete 4 Views Right  03/15/2011  *RADIOLOGY REPORT*  Clinical Data: Right lateral knee pain.  RIGHT KNEE - COMPLETE 4+ VIEW  Comparison: Plain films right knee 01/25/2011.  Findings: There is no fracture or dislocation.  Advanced degenerative is present about the knee with bulky osteophytosis and loose bodies noted. Small joint effusion is noted.  IMPRESSION: No acute finding. Advanced tricompartmental degenerative disease without marked change.  Original Report Authenticated By: Bernadene Bell. D'ALESSIO, M.D.     1. Knee pain       MDM     Pt feels improved after observation and/or treatment in ED.   Patient / Family / Caregiver understand and agree with initial ED impression and plan with expectations set for ED  visit.     OUTPATIENT MEDICATIONS PRESCRIBED FROM THE ED:   New Prescriptions   HYDROCODONE-ACETAMINOPHEN (NORCO) 5-325 MG PER TABLET    Take one tab po q 4-6 hrs prn pain        Saleemah Mollenhauer L. Yaak, [place] 03/15/11 2138

## 2011-03-15 NOTE — ED Notes (Signed)
Patient is resting.  Pt told provider will be with her shortly and apologized for wait.  Chelsey Santiago

## 2011-03-15 NOTE — ED Notes (Signed)
Pt c/o bilateral knee pain x 2 weeks but worse for the last week. Pt states that she feels like she cant' walk good. Ambulated to triage with no difficulty.

## 2011-03-16 ENCOUNTER — Ambulatory Visit: Payer: Medicaid Other | Admitting: Adult Health

## 2011-03-16 NOTE — ED Provider Notes (Signed)
Medical screening examination/treatment/procedure(s) were performed by non-physician practitioner and as supervising physician I was immediately available for consultation/collaboration.  Ethelda Chick, MD 03/16/11 [phone]

## 2011-03-25 ENCOUNTER — Ambulatory Visit: Payer: Medicaid Other | Admitting: Family Medicine

## 2011-03-25 ENCOUNTER — Ambulatory Visit (INDEPENDENT_AMBULATORY_CARE_PROVIDER_SITE_OTHER): Payer: Medicaid Other | Admitting: Family Medicine

## 2011-03-25 ENCOUNTER — Encounter: Payer: Self-pay | Admitting: Family Medicine

## 2011-03-25 DIAGNOSIS — M1710 Unilateral primary osteoarthritis, unspecified knee: Secondary | ICD-10-CM

## 2011-03-25 DIAGNOSIS — M25569 Pain in unspecified knee: Secondary | ICD-10-CM

## 2011-03-25 MED ORDER — TRAMADOL HCL 50 MG PO TABS: 50.0000 mg | ORAL_TABLET | Freq: Four times a day (QID) | ORAL | Status: DC | PRN

## 2011-03-25 MED ORDER — HYDROCODONE-ACETAMINOPHEN 5-500 MG PO TABS: 1.0000 | ORAL_TABLET | Freq: Four times a day (QID) | ORAL | Status: DC | PRN

## 2011-03-25 NOTE — Patient Instructions (Signed)
You can always see and discuss with Dr. Madelon Lips or one of the other MD's at [facility], also

## 2011-03-29 ENCOUNTER — Encounter: Payer: Self-pay | Admitting: Family Medicine

## 2011-03-29 DIAGNOSIS — M1710 Unilateral primary osteoarthritis, unspecified knee: Secondary | ICD-10-CM

## 2011-03-29 DIAGNOSIS — M179 Osteoarthritis of knee, unspecified: Secondary | ICD-10-CM

## 2011-03-29 HISTORY — DX: Osteoarthritis of knee, unspecified: M17.9

## 2011-03-29 HISTORY — DX: Unilateral primary osteoarthritis, unspecified knee: M17.10

## 2011-03-29 NOTE — Progress Notes (Signed)
  Subjective:    Patient ID: Chelsey Santiago, female    DOB: [birth year], 35 y.o.   MRN: [id number]  HPI  Pleasant female with a history of chronic bilateral knee pain with the worsening of her knee pain for the last 2 months. The RIGHT is notably worse compared to the LEFT. She has significant and severe osteoarthritis for age. Knee x-rays are reviewed which show advanced degenerative changes for age, and a probable loose body as well on the RIGHT.  She describes a situation where she fell onto her anterior aspect of her knees 2 months ago. She is having some popping and grinding. Worse with going up and down stairs. She also has worse pain when she is standing up for long periods of times. She describes a sensation where her RIGHT knee will occasionally lock.  2 weeks ago her LEFT knee also became quite swollen.  She describes her pain as 8/10 on a pain scale today.  She's never had any prior operative interventions in her knees. She has done some physical therapy in the past. She has never had any intra-articular injections.  She is hypertensive today with a blood pressure of 160/110  The PMH, PSH, Social History, Family History, Medications, and allergies have been reviewed in [facility], and have been updated if relevant.   Review of Systems REVIEW OF SYSTEMS  GEN: No fevers, chills. Nontoxic. Primarily MSK c/o today. MSK: Detailed in the HPI GI: tolerating PO intake without difficulty Neuro: No numbness, parasthesias, or tingling associated. Otherwise the pertinent positives of the ROS are noted above.      Objective:   Physical Exam   Physical Exam  Blood pressure 163/118, pulse 103, height 5\' 6"  (1.676 m), weight 240 lb (108.863 kg), last menstrual period 02/22/2011.  GEN: WDWN, NAD, Non-toxic, A & O x 3 HEENT: Atraumatic, Normocephalic. Neck supple. No masses, No LAD. Ears and Nose: No external deformity. EXTR: No c/c/e NEURO Normal gait. Minimal antalgia PSYCH: Normally  interactive. Conversant. Not depressed or anxious appearing.  Calm demeanor.   Bilateral knees: Moderate effusion on the RIGHT, mild on the LEFT. Lacks 30 of extension on the RIGHT. Flexion to 115. Tenderness on the medial lateral joint line on the RIGHT. Pain with patellar compression. There is significant crepitus. Stable to varus and valgus stress.  Negative Lachman. Negative anterior and posterior drawer testing. Pain with McMurray's. Positive flexion pinch. Pos bounce home     Assessment & Plan:   1. Osteoarthritis, knee   2. Knee pain     Advanced osteoarthritis for age, RIGHT greater than LEFT. Suspect acute exacerbation on top of chronic osteoarthritis. It appears that she likely has a loose body in her RIGHT knee as well.  I tried to explain this and the anatomy involved to the best of my ability. Given her significant hypertension currently, I recommended that she followup with a primary care doctor for evaluation and management. I did keep her off of anti-inflammatories currently.  Gave her a small supply of pain medication which I think is reasonable in the short-term.  She has not had any injection, and I offered to aspirate and inject her knee with corticosteroid, but she declined. Given her age and state of her knees, she'll need some long-term followup, and I recommended she follow up with a primary care physician. She may need some pain management long-term facilitated by her PCP, which is out of the scope of our office.

## 2011-04-01 ENCOUNTER — Other Ambulatory Visit: Payer: Self-pay

## 2011-04-01 ENCOUNTER — Ambulatory Visit: Payer: Medicaid Other | Admitting: Family Medicine

## 2011-04-01 ENCOUNTER — Telehealth: Payer: Self-pay | Admitting: Adult Health

## 2011-04-01 MED ORDER — LABETALOL HCL 200 MG PO TABS: 200.0000 mg | ORAL_TABLET | Freq: Two times a day (BID) | ORAL | Status: DC

## 2011-04-01 MED ORDER — AMLODIPINE BESYLATE 10 MG PO TABS: 10.0000 mg | ORAL_TABLET | Freq: Every day | ORAL | Status: DC

## 2011-04-01 NOTE — Telephone Encounter (Signed)
Please call in Labetalol and Amlodipine to [facility] in /tg

## 2011-04-02 ENCOUNTER — Ambulatory Visit (INDEPENDENT_AMBULATORY_CARE_PROVIDER_SITE_OTHER): Payer: Medicaid Other | Admitting: Adult Health

## 2011-04-02 ENCOUNTER — Encounter: Payer: Self-pay | Admitting: Adult Health

## 2011-04-02 DIAGNOSIS — R079 Chest pain, unspecified: Secondary | ICD-10-CM

## 2011-04-02 DIAGNOSIS — I10 Essential (primary) hypertension: Secondary | ICD-10-CM

## 2011-04-02 NOTE — Assessment & Plan Note (Signed)
She is doing well and I have congratulated her on weight loss. BP is doing very well. Will make no changes at this time. She is given samples of Benicar.  We will see her in 3 months. May have to adjust her meds should she continue wt loss to avoid hypotension.

## 2011-04-02 NOTE — Patient Instructions (Signed)
Your physician recommends that you schedule a follow-up appointment in: 3 months.  

## 2011-04-02 NOTE — Progress Notes (Signed)
HPI [name] is a 35 y/o patient of Dr.Rothbart we are following with ongoing issues with hypertension. She has been on multiple medications but has settled on current regimen listed below. Since being seen last she has been going to the [facility] and and has lost 18 lbs since last visit. She is medically complaint and feels great.  No Known Allergies  Current Outpatient Prescriptions  Medication Sig Dispense Refill  . ALPRAZolam (XANAX) 1 MG tablet Take 1 mg by mouth at bedtime as needed. For anxiety      . amLODipine (NORVASC) 10 MG tablet Take 1 tablet (10 mg total) by mouth daily.  30 tablet  3  . escitalopram (LEXAPRO) 10 MG tablet Take 20 mg by mouth daily.       . furosemide (LASIX) 20 MG tablet Take 1 tablet (20 mg total) by mouth daily as needed.  30 tablet  3  . labetalol (NORMODYNE) 200 MG tablet Take 1 tablet (200 mg total) by mouth 2 (two) times daily.  60 tablet  3  . olmesartan-hydrochlorothiazide (BENICAR HCT) 40-25 MG per tablet Take 1 tablet by mouth daily.        . potassium chloride SA (K-DUR,KLOR-CON) 20 MEQ tablet With furosemide   30 tablet  3  . traZODone (DESYREL) 100 MG tablet Take 100 mg by mouth at bedtime.          Past Medical History  Diagnosis Date  . Hypertension   . Hypertensive heart disease   . FHx: migraine headaches   . Anemia   . Mitral regurgitation   . Osteoarthritis, knee 03/29/2011    Past Surgical History  Procedure Date  . Breast reduction surgery     WUJ:WJXBJY of systems complete and found to be negative unless listed above PHYSICAL EXAM BP 130/86  Pulse 100  Resp 18  Ht 5\' 6"  (1.676 m)  Wt 228 lb (103.42 kg)  BMI 36.80 kg/m2  LMP 02/22/2011    ASSESSMENT AND PLAN

## 2011-04-07 ENCOUNTER — Encounter: Payer: Self-pay | Admitting: Family Medicine

## 2011-04-07 ENCOUNTER — Ambulatory Visit (INDEPENDENT_AMBULATORY_CARE_PROVIDER_SITE_OTHER): Payer: Medicaid Other | Admitting: Family Medicine

## 2011-04-07 VITALS — BP 153/105 | HR 99 | Temp 97.6°F | Ht 66.0 in | Wt 227.0 lb

## 2011-04-07 DIAGNOSIS — M1710 Unilateral primary osteoarthritis, unspecified knee: Secondary | ICD-10-CM

## 2011-04-07 NOTE — Assessment & Plan Note (Signed)
Bilateral knee pain 2/2 advanced osteoarthritis for age.  Patient would like to proceed with cortisone injections for knees today (has never had before) so we went ahead with this.  Discussed other measures (tylenol, glucosamine, nsaids - though recommended against with HTN and heart disease, capsaicin, other injections - synvisc) as adjuncts as well.  See instructions for further.  F/u prn.  After informed written consent, patient was seated on exam table. Right knee was prepped with alcohol swab and utilizing anteromedial approach, patient's right knee was injected intraarticularly with 3:1 marcaine: depomedrol. Patient tolerated the procedure well without immediate complications.  After informed written consent, patient was seated on exam table. Left knee was prepped with alcohol swab and utilizing anteromedial approach, patient's left knee was injected intraarticularly with 3:1 marcaine: depomedrol. Patient tolerated the procedure well without immediate complications.

## 2011-04-07 NOTE — Patient Instructions (Signed)
You have advanced arthritis in your knees. Take tylenol 500mg  1-2 tabs three times a day for pain. Aleve 1-2 tabs twice a day with food Glucosamine sulfate 750mg  twice a day is a supplement that has been shown to help moderate to severe arthritis. Capsaicin topically up to four times a day may also help with pain. Cortisone injections are an option - these can be repeated every 3 months if needed. If cortisone injections do not help, there are different types of shots that may help but they take longer to take effect. It's important that you continue to stay active. If you are overweight, try to lose weight through diet and exercise. Consider physical therapy to strengthen muscles around the joint that hurts to take pressure off of the joint itself. Walker or cane if needed. Heat or ice 15 minutes at a time 3-4 times a day as needed to help with pain. Water aerobics and cycling with low resistance are the best two types of exercise for arthritis. A knee brace may be helpful if your knee feels unstable due to pain. Follow up with [place] or in [place] as needed.

## 2011-04-07 NOTE — Progress Notes (Signed)
Subjective:    Patient ID: Chelsey Santiago, female    DOB: 1976/01/24, 35 y.o.   MRN: [id number]  Knee Pain   PCP: [facility]  35 yo F here for f/u bilateral knee pain.  11/8: Pleasant female with a history of chronic bilateral knee pain with the worsening of her knee pain for the last 2 months. The RIGHT is notably worse compared to the LEFT. She has significant and severe osteoarthritis for age. Knee x-rays are reviewed which show advanced degenerative changes for age, and a probable loose body as well on the RIGHT.  She describes a situation where she fell onto her anterior aspect of her knees 2 months ago. She is having some popping and grinding. Worse with going up and down stairs. She also has worse pain when she is standing up for long periods of times. She describes a sensation where her RIGHT knee will occasionally lock.  2 weeks ago her LEFT knee also became quite swollen.  She describes her pain as 8/10 on a pain scale today.  She's never had any prior operative interventions in her knees. She has done some physical therapy in the past. She has never had any intra-articular injections.  11/21: Patient returns today with persistent pain, now 9/10 in right > left knees. She would like to proceed with cortisone injections of her knees. As noted above, has never had injections before. X-rays reviewed (has had them of right knee) and show advanced tricompartmental DJD for age. Not taking nsaids (has been advised against this due to HTN and hypertensive heart disease). Does feel right knee pop, grind, occasionally catch, and buckle.  Past Medical History  Diagnosis Date  . Hypertension   . Hypertensive heart disease   . FHx: migraine headaches   . Anemia   . Mitral regurgitation   . Osteoarthritis, knee 03/29/2011    Current Outpatient Prescriptions on File Prior to Visit  Medication Sig Dispense Refill  . ALPRAZolam (XANAX) 1 MG tablet Take 1 mg by  mouth at bedtime as needed. For anxiety      . amLODipine (NORVASC) 10 MG tablet Take 1 tablet (10 mg total) by mouth daily.  30 tablet  3  . escitalopram (LEXAPRO) 10 MG tablet Take 20 mg by mouth daily.       . furosemide (LASIX) 20 MG tablet Take 1 tablet (20 mg total) by mouth daily as needed.  30 tablet  3  . labetalol (NORMODYNE) 200 MG tablet Take 1 tablet (200 mg total) by mouth 2 (two) times daily.  60 tablet  3  . olmesartan-hydrochlorothiazide (BENICAR HCT) 40-25 MG per tablet Take 1 tablet by mouth daily.        . potassium chloride SA (K-DUR,KLOR-CON) 20 MEQ tablet With furosemide   30 tablet  3  . traZODone (DESYREL) 100 MG tablet Take 100 mg by mouth at bedtime.          Past Surgical History  Procedure Date  . Breast reduction surgery     No Known Allergies  History   Social History  . Marital Status: Single    Spouse Name: N/A    Number of Children: N/A  . Years of Education: N/A   Occupational History  . unemployed    Social History Main Topics  . Smoking status: Never Smoker   . Smokeless tobacco: Never Used  . Alcohol Use: No  . Drug Use: No  . Sexually Active: Yes  Birth Control/ Protection: IUD   Other Topics Concern  . Not on file   Social History Narrative  . No narrative on file    Family History  Problem Relation Age of Onset  . Diabetes Mother   . Hypertension Maternal Uncle   . Hyperlipidemia Paternal Grandfather   . Sudden death Neg Hx   . Heart attack Neg Hx     BP 153/105  Pulse 99  Temp(Src) 97.6 F (36.4 C) (Oral)  Ht 5\' 6"  (1.676 m)  Wt 227 lb (102.967 kg)  BMI 36.64 kg/m2  LMP 02/22/2011  Review of Systems   See HPI  Objective:   Physical Exam Gen: NAD  Bilateral knees: No gross deformity, bruising.  Mild soft tissue swelling and 1+ crepitation bilateral knees. TTP medial > lateral joint lines.  No posterior patellar facet, patellar tendon, pes TTP. ROM 0 - 120 degrees bilaterally (has full extension of right  knee currently). Negative ant/post drawers.  Stable to valgus and varus stress. Negative mcmurrays, apleys, patellar apprehension, and clarkes bilaterally. NVI distally.     Assessment & Plan:  1. Bilateral knee pain - 2/2 advanced osteoarthritis for age.  Patient would like to proceed with cortisone injections for knees today (has never had before) so we went ahead with this.  Discussed other measures (tylenol, glucosamine, nsaids - though recommended against with HTN and heart disease, capsaicin, other injections - synvisc) as adjuncts as well.  See instructions for further.  F/u prn.  After informed written consent, patient was seated on exam table. Right knee was prepped with alcohol swab and utilizing anteromedial approach, patient's right knee was injected intraarticularly with 3:1 marcaine: depomedrol. Patient tolerated the procedure well without immediate complications.  After informed written consent, patient was seated on exam table. Left knee was prepped with alcohol swab and utilizing anteromedial approach, patient's left knee was injected intraarticularly with 3:1 marcaine: depomedrol. Patient tolerated the procedure well without immediate complications.

## 2011-04-15 ENCOUNTER — Ambulatory Visit: Payer: Medicaid Other | Admitting: Family Medicine

## 2011-05-06 ENCOUNTER — Ambulatory Visit (INDEPENDENT_AMBULATORY_CARE_PROVIDER_SITE_OTHER): Payer: Medicaid Other | Admitting: Family Medicine

## 2011-05-06 ENCOUNTER — Encounter: Payer: Self-pay | Admitting: Family Medicine

## 2011-05-06 VITALS — BP 130/88 | HR 92 | Ht 66.0 in | Wt 223.0 lb

## 2011-05-06 DIAGNOSIS — IMO0002 Reserved for concepts with insufficient information to code with codable children: Secondary | ICD-10-CM

## 2011-05-06 DIAGNOSIS — M25569 Pain in unspecified knee: Secondary | ICD-10-CM

## 2011-05-06 DIAGNOSIS — M2340 Loose body in knee, unspecified knee: Secondary | ICD-10-CM

## 2011-05-06 DIAGNOSIS — M1710 Unilateral primary osteoarthritis, unspecified knee: Secondary | ICD-10-CM

## 2011-05-06 DIAGNOSIS — M25561 Pain in right knee: Secondary | ICD-10-CM

## 2011-05-06 MED ORDER — HYDROCODONE-ACETAMINOPHEN 5-325 MG PO TABS: 1.0000 | ORAL_TABLET | Freq: Four times a day (QID) | ORAL | Status: AC | PRN

## 2011-05-06 MED ORDER — DICLOFENAC SODIUM 75 MG PO TBEC: 75.0000 mg | DELAYED_RELEASE_TABLET | Freq: Two times a day (BID) | ORAL | Status: DC

## 2011-05-07 ENCOUNTER — Other Ambulatory Visit: Payer: Self-pay | Admitting: *Deleted

## 2011-05-07 ENCOUNTER — Encounter: Payer: Self-pay | Admitting: *Deleted

## 2011-05-07 DIAGNOSIS — M25561 Pain in right knee: Secondary | ICD-10-CM

## 2011-05-07 DIAGNOSIS — M2340 Loose body in knee, unspecified knee: Secondary | ICD-10-CM

## 2011-05-07 DIAGNOSIS — M1710 Unilateral primary osteoarthritis, unspecified knee: Secondary | ICD-10-CM

## 2011-05-07 NOTE — Progress Notes (Signed)
Patient Name: Chelsey Santiago Date of Birth: May 15, 1976 Medical Record Number: [id number] Gender: female  History of Present Illness:  Chelsey Santiago is a 35 y.o. very pleasant female patient who presents with the following:  Very pleasant African American female, 35 year old, who presents with persistent bilateral knee pain, RIGHT consistently worse than the LEFT. She is having some mechanical symptoms, and buckling of her knee. She had an intra-articular injection slightly more than one month ago, and she had excellent relief of symptoms for only one half weeks. She is having significant pain with flexion. She does have relatively advanced arthritic changes for her age. There is been no discrete injury recently. No prior operative interventions in the affected knee RIGHT or LEFT. She is not having mechanical locking up.  On her prior x-rays, it appeared as if the patient probably has a loose body or even 2 loose bodies.  RIGHT KNEE - COMPLETE 4+ VIEW   Comparison: Plain films right knee 01/25/2011.   Findings: There is no fracture or dislocation.  Advanced degenerative is present about the knee with bulky osteophytosis and loose bodies noted. Small joint effusion is noted.   IMPRESSION: No acute finding. Advanced tricompartmental degenerative disease without marked change.   Original Report Authenticated By: Bernadene Bell. Maricela Curet, M.D.   Patient Active Problem List  Diagnoses  . HYPOKALEMIA  . OBESITY  . SLEEP APNEA  . Hypertension  . Hypertensive heart disease  . FHx: migraine headaches  . Anemia  . Mitral regurgitation  . Osteoarthritis, knee   Past Medical History  Diagnosis Date  . Hypertension   . Hypertensive heart disease   . FHx: migraine headaches   . Anemia   . Mitral regurgitation   . Osteoarthritis, knee 03/29/2011   Past Surgical History  Procedure Date  . Breast reduction surgery    History  Substance Use Topics  . Smoking status: Never Smoker     . Smokeless tobacco: Never Used  . Alcohol Use: No   Family History  Problem Relation Age of Onset  . Diabetes Mother   . Hypertension Maternal Uncle   . Hyperlipidemia Paternal Grandfather   . Sudden death Neg Hx   . Heart attack Neg Hx    No Known Allergies Current Outpatient Prescriptions on File Prior to Visit  Medication Sig Dispense Refill  . ALPRAZolam (XANAX) 1 MG tablet Take 1 mg by mouth at bedtime as needed. For anxiety      . amLODipine (NORVASC) 10 MG tablet Take 1 tablet (10 mg total) by mouth daily.  30 tablet  3  . escitalopram (LEXAPRO) 10 MG tablet Take 20 mg by mouth daily.       . furosemide (LASIX) 20 MG tablet Take 1 tablet (20 mg total) by mouth daily as needed.  30 tablet  3  . labetalol (NORMODYNE) 200 MG tablet Take 1 tablet (200 mg total) by mouth 2 (two) times daily.  60 tablet  3  . olmesartan-hydrochlorothiazide (BENICAR HCT) 40-25 MG per tablet Take 1 tablet by mouth daily.        . potassium chloride SA (K-DUR,KLOR-CON) 20 MEQ tablet With furosemide   30 tablet  3  . traZODone (DESYREL) 100 MG tablet Take 100 mg by mouth at bedtime.          Review of Systems:  GEN: No fevers, chills. Nontoxic. Primarily MSK c/o today. MSK: Detailed in the HPI GI: tolerating PO intake without difficulty Neuro: No numbness, parasthesias, or tingling  associated. Otherwise the pertinent positives of the ROS are noted above.    Physical Examination: Filed Vitals:   05/06/11 1624  BP: 130/88  Pulse: 92  Height: 5\' 6"  (1.676 m)  Weight: 223 lb (101.152 kg)    Body mass index is 35.99 kg/(m^2).   GEN: WDWN, NAD, Non-toxic, Alert & Oriented x 3 HEENT: Atraumatic, Normocephalic.  Ears and Nose: No external deformity. EXTR: No clubbing/cyanosis/edema NEURO: Normal gait.  PSYCH: Normally interactive. Conversant. Not depressed or anxious appearing.  Calm demeanor.   Knee:  R Gait: Normal heel toe pattern ROM: 0-120 Effusion: neg Echymosis or edema:  none Patellar tendon NT Painful PLICA: neg Patellar grind: pos with crepitus Medial and lateral patellar facet loading: negative medial and lateral joint lines: tender more medially Mcmurray's pos for pain Flexion-pinch pos Varus and valgus stress: stable Lachman: neg Ant and Post drawer: neg Hip abduction, IR, ER: WNL Hip flexion str: 5/5 Hip abd: 5/5 Quad: 5/5 VMO atrophy:No Hamstring concentric and eccentric: 5/5   Assessment and Plan: 1. Knee pain, right   2. Osteoarthritis, knee   3. Loose body in knee     Healthy 35 year old. Physically limited now by knee pain. Obtain MRI of the RIGHT kneeto evaluate for internal arrangement, meniscal pathology, further delineation of loose body. Failure of conservative management for greater than one month. The patient has been on multiple NSAIDs without relief. Intra-articular injection provided minimal relief and return of symptoms within one half weeks. For now, I am going to change her to oral diclofenac, and give her a short supply of Vicodin.

## 2011-05-07 NOTE — Patient Instructions (Signed)
MRI APPT IS SCHD FOR THURS DEC 27TH AT [address] [facility] AT 6:15PM.

## 2011-05-13 ENCOUNTER — Other Ambulatory Visit: Payer: Medicaid Other

## 2011-05-19 ENCOUNTER — Telehealth: Payer: Self-pay | Admitting: Cardiology

## 2011-05-19 NOTE — Telephone Encounter (Signed)
Advised patient to follow up with Dr Dallas Schimke regarding this issue since he prescribed the medication and is managing this.

## 2011-05-19 NOTE — Telephone Encounter (Signed)
PT WAS PUT ON VOLTAREN 75 MG BY DR SPENCER COPELAND FOR INFLAMMATION IN LEGS. SHE HAS BEEN ON IT FOR A WEEK AND THE LAST THREE DAYS HAS NOTICE HER LEGS ARE SWOLLEN. WANTS [place] TO ADVISE.

## 2011-05-21 ENCOUNTER — Ambulatory Visit
Admission: RE | Admit: 2011-05-21 | Discharge: 2011-05-21 | Disposition: A | Discharge status: Home / Self Care | Payer: Medicaid Other | Source: Ambulatory Visit | Attending: Family Medicine | Admitting: Family Medicine

## 2011-05-26 ENCOUNTER — Telehealth: Payer: Self-pay | Admitting: *Deleted

## 2011-05-26 NOTE — Telephone Encounter (Signed)
Pt called requesting MRI results.  

## 2011-05-27 NOTE — Telephone Encounter (Signed)
Dr. Patsy Lager is out of town until next Monday... Let pt know this or is someone at [facility] covering for him?  FYI: I am Dr. Ermalene Searing covering for him at [facility].

## 2011-05-27 NOTE — Telephone Encounter (Signed)
Dr. Darrick Penna reviewed MRI- he states there are very significant arthritic changes for patient's age.  Chelsey Santiago called pt and scheduled her to come in to see Dr. Patsy Lager 06/01/11 to discuss plan.

## 2011-05-29 ENCOUNTER — Encounter (HOSPITAL_COMMUNITY): Payer: Self-pay | Admitting: Emergency Medicine

## 2011-05-29 ENCOUNTER — Emergency Department (HOSPITAL_COMMUNITY)
Admission: EM | Admit: 2011-05-29 | Discharge: 2011-05-29 | Disposition: A | Discharge status: Home / Self Care | Payer: Medicaid Other | Attending: Emergency Medicine | Admitting: Emergency Medicine

## 2011-05-29 ENCOUNTER — Emergency Department (HOSPITAL_COMMUNITY): Payer: Medicaid Other

## 2011-05-29 DIAGNOSIS — I10 Essential (primary) hypertension: Secondary | ICD-10-CM | POA: Insufficient documentation

## 2011-05-29 DIAGNOSIS — IMO0001 Reserved for inherently not codable concepts without codable children: Secondary | ICD-10-CM | POA: Insufficient documentation

## 2011-05-29 DIAGNOSIS — M25559 Pain in unspecified hip: Secondary | ICD-10-CM | POA: Insufficient documentation

## 2011-05-29 DIAGNOSIS — F341 Dysthymic disorder: Secondary | ICD-10-CM | POA: Insufficient documentation

## 2011-05-29 DIAGNOSIS — Z79899 Other long term (current) drug therapy: Secondary | ICD-10-CM | POA: Insufficient documentation

## 2011-05-29 DIAGNOSIS — M25551 Pain in right hip: Secondary | ICD-10-CM

## 2011-05-29 DIAGNOSIS — M1710 Unilateral primary osteoarthritis, unspecified knee: Secondary | ICD-10-CM | POA: Insufficient documentation

## 2011-05-29 DIAGNOSIS — M25569 Pain in unspecified knee: Secondary | ICD-10-CM | POA: Insufficient documentation

## 2011-05-29 HISTORY — DX: Sleep apnea, unspecified: G47.30

## 2011-05-29 HISTORY — DX: Anxiety disorder, unspecified: F41.9

## 2011-05-29 MED ORDER — HYDROCODONE-ACETAMINOPHEN 5-325 MG PO TABS: 1.0000 | ORAL_TABLET | ORAL | Status: AC | PRN

## 2011-05-29 MED ORDER — KETOROLAC TROMETHAMINE 60 MG/2ML IM SOLN
60.0000 mg | Freq: Once | INTRAMUSCULAR | Status: AC
  Administered 2011-05-29: 60 mg via INTRAMUSCULAR
  Filled 2011-05-29: qty 2

## 2011-05-29 MED ORDER — PREDNISONE 50 MG PO TABS: 50.0000 mg | ORAL_TABLET | Freq: Every day | ORAL | Status: AC

## 2011-05-29 NOTE — ED Notes (Signed)
Pt c/o right hip pain, denies any injury, pt states that she has been having problems with her right knee and the pain radiates from her right knee up to her right hip area, cms intact distal

## 2011-05-29 NOTE — ED Notes (Signed)
Pt returned from xray

## 2011-05-29 NOTE — ED Notes (Signed)
Pt ambulatory to xray.

## 2011-05-29 NOTE — ED Notes (Signed)
Dr. Adriana Simas notified of pt pain level, no additional orders given

## 2011-05-29 NOTE — ED Notes (Signed)
Patient c/o right hip pain that radiates into legs. Patient denies any know injury. Per patient has been diagnosed with cyst behind right knee and arthritis in knees bilaterally.

## 2011-05-29 NOTE — ED Provider Notes (Signed)
History   Scribed for Chelsey Hutching, MD, the patient was seen in [facility]. The chart was scribed by Gilman Schmidt. The patients care was started at 10:28 AM.   CSN: [id number]  Arrival date & time 05/29/11  4098   First MD Initiated Contact with Patient 05/29/11 [id number]      Chief Complaint  Patient presents with  . Hip Pain    (Consider location/radiation/quality/duration/timing/severity/associated sxs/prior treatment) HPITosha Lurlean Santiago is a 36 y.o. female with a history of multiple illnesses including osteoarthritis who presents to the Emergency Department complaining of hip pain onset two weeks. Also notes pain in knee bilaterally. States Dr. Dallas Schimke at [facility] performed an MRI on right knee and found cysts behind knee cap. Also notes that [facility] and is unable to get a referral. Denies any recent fall or injury. Denies any abdominal pain or chest pain. There are no other associated symptoms and no other alleviating or aggravating factors.   Past Medical History  Diagnosis Date  . Hypertension   . Hypertensive heart disease   . FHx: migraine headaches   . Anemia   . Mitral regurgitation   . Osteoarthritis, knee 03/29/2011  . Sleep apnea   . Depression   . Anxiety     Past Surgical History  Procedure Date  . Breast reduction surgery     Family History  Problem Relation Age of Onset  . Diabetes Mother   . Hypertension Maternal Uncle   . Hyperlipidemia Paternal Grandfather   . Sudden death Neg Hx   . Heart attack Neg Hx     History  Substance Use Topics  . Smoking status: Never Smoker   . Smokeless tobacco: Never Used  . Alcohol Use: No    OB History    Grav Para Term Preterm Abortions TAB SAB Ect Mult Living   6 4 4  2  2   4       Review of Systems  Musculoskeletal:       Hip pain Knee Pain  All other systems reviewed and are negative.    Allergies  Tramadol and Vicodin  Home Medications   Current Outpatient  Rx  Name Route Sig Dispense Refill  . ALPRAZOLAM 1 MG PO TABS Oral Take 1 mg by mouth at bedtime as needed. For anxiety    . AMLODIPINE BESYLATE 10 MG PO TABS Oral Take 1 tablet (10 mg total) by mouth daily. 30 tablet 3  . DICLOFENAC SODIUM 75 MG PO TBEC Oral Take 1 tablet (75 mg total) by mouth 2 (two) times daily. 60 tablet 3  . ESCITALOPRAM OXALATE 10 MG PO TABS Oral Take 20 mg by mouth daily.     . FUROSEMIDE 20 MG PO TABS Oral Take 1 tablet (20 mg total) by mouth daily as needed. 30 tablet 3  . LABETALOL HCL 200 MG PO TABS Oral Take 1 tablet (200 mg total) by mouth 2 (two) times daily. 60 tablet 3  . OLMESARTAN MEDOXOMIL-HCTZ 40-25 MG PO TABS Oral Take 1 tablet by mouth daily.      Marland Kitchen POTASSIUM CHLORIDE CRYS ER 20 MEQ PO TBCR  With furosemide  30 tablet 3  . TRAZODONE HCL 100 MG PO TABS Oral Take 100 mg by mouth at bedtime.        BP 176/110  Pulse 92  Temp(Src) 97.8 F (36.6 C) (Oral)  Resp 16  Ht 5\' 6"  (1.676 m)  Wt 223 lb (101.152 kg)  BMI 35.99 kg/m2  SpO2 99%  LMP 04/24/2011  Physical Exam  Constitutional: She is oriented to person, place, and time. She appears well-developed and well-nourished.  Non-toxic appearance. She does not have a sickly appearance.  HENT:  Head: Normocephalic and atraumatic.  Eyes: Conjunctivae, EOM and lids are normal. Pupils are equal, round, and reactive to light. No scleral icterus.  Neck: Trachea normal and normal range of motion. Neck supple.  Cardiovascular: Regular rhythm and normal heart sounds.   Pulmonary/Chest: Effort normal and breath sounds normal.  Abdominal: Soft. Normal appearance. There is no tenderness. There is no rebound, no guarding and no CVA tenderness.  Musculoskeletal: Normal range of motion.       Right posterior buttocks tenderness  Neurological: She is alert and oriented to person, place, and time. She has normal strength.  Skin: Skin is warm, dry and intact. No rash noted.    ED Course  Procedures (including  critical care time)  Labs Reviewed - No data to display No results found.   No diagnosis found.   DIAGNOSTIC STUDIES: Oxygen Saturation is 99% on room air, normal by my interpretation.    Radiology: DG Hip Complete Right. Reviewed by me. IMPRESSION: No evidence for acute abnormality. Original Report Authenticated By: Patterson Hammersmith, M.D.   COORDINATION OF CARE: 10:28am:  - Patient evaluated by ED physician, Toradol, DG Hip ordered  Dg Hip Complete Right  05/29/2011  *RADIOLOGY REPORT*  Clinical Data: Right hip pain.  RIGHT HIP - COMPLETE 2+ VIEW  Comparison: None  Findings: AP and lateral views of the right hip include an AP view of the pelvis.  There is degenerative change of the hip.  No evidence for acute fracture or dislocation.  Regional bowel gas pattern is nonobstructive.  Scattered phleboliths are present.  IMPRESSION: No evidence for acute  abnormality.  Original Report Authenticated By: Patterson Hammersmith, M.D.     MDM  X-ray of right hip negative. Prescription for pain medicine and prednisone. Followup your Dr.   I personally performed the services described in this documentation, which was scribed in my presence. The recorded information has been reviewed and considered.        Chelsey Hutching, MD 05/29/11 1247

## 2011-05-30 NOTE — Telephone Encounter (Signed)
Will discuss at ov 

## 2011-06-01 ENCOUNTER — Ambulatory Visit (INDEPENDENT_AMBULATORY_CARE_PROVIDER_SITE_OTHER): Payer: Medicaid Other | Admitting: Family Medicine

## 2011-06-01 DIAGNOSIS — M1710 Unilateral primary osteoarthritis, unspecified knee: Secondary | ICD-10-CM

## 2011-06-01 NOTE — Progress Notes (Signed)
Patient Name: YAMILET MCFAYDEN Date of Birth: 1976/03/19 Age: 36 y.o. Medical Record Number: [id number] Gender: female Date of Encounter: 06/01/2011  History of Present Illness:  SHONICA WEIER is a 36 y.o. very pleasant female patient who presents with the following:  Pleasant patient seen in followup for continued RIGHT knee pain. She had some swelling with her oral diclofenac. She is able to tolerate Motrin or Aleve. She is having significant limiting knee pain, and she only got one week's worth of relief for an intra-articular corticosteroid injection. He recently got an MRI of her RIGHT knee, and she had worse than anticipated findings with advanced arthritic changes for her age as well as a tibial cyst.  She went to the emergency room over the weekend with some right-sided referred to pain. In she was given some prednisone.  RIGHT HIP - COMPLETE 2+ VIEW   Comparison: None   Findings: AP and lateral views of the right hip include an AP view of the pelvis.  There is degenerative change of the hip.  No evidence for acute fracture or dislocation.  Regional bowel gas pattern is nonobstructive.  Scattered phleboliths are present.   IMPRESSION: No evidence for acute  abnormality.   Original Report Authenticated By: Patterson Hammersmith, M  MRI OF THE RIGHT KNEE WITHOUT CONTRAST   Technique:  Multiplanar, multisequence MR imaging was performed. No intravenous contrast was administered.   Comparison: Radiographs 03/15/2011.   Findings: Advanced tricompartmental degenerative change for age with joint space narrowing, osteophytic spurring and degenerative chondrosis.  There is a full or near full-thickness cartilage loss involving the patellar apex and lateral facet and fairly significant lateral subluxation and lateral tilt of the patella in relation to the femoral trochlear groove.  The medial and lateral compartment articular cartilage is intact.  No osteochondral lesions.   There is a large complex multiseptated cystic lesion in the central tibia consistent with an intraosseous ganglion.   The cruciate and collateral ligaments are intact.  Mild MCL and pes anserinus bursitis is noted.   The menisci demonstrate normal morphology.  No discrete meniscal tear.   The patella retinacular structures are intact and the quadriceps and patellar tendons are intact.  There is a small joint effusion. No Baker's cyst.  There is a ganglion cyst noted in the posterior joint space.   IMPRESSION:   1.  Advanced tricompartmental degenerative changes for age, most notable in the patellofemoral joint. 2.  Significant lateral tilt and orientation of the patella in relation to the femoral trochlear groove. 3.  Intact ligamentous structures and no acute bony findings. 4.  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 5.  Small joint effusion. 6.  Posterior joint space ganglion cyst.   Original Report Authenticated By: P. Loralie Champagne, M.D. RIGHT KNEE - COMPLETE 4+ VIEW   Comparison: Plain films right knee 01/25/2011.   Findings: There is no fracture or dislocation.  Advanced degenerative is present about the knee with bulky osteophytosis and loose bodies noted. Small joint effusion is noted.   IMPRESSION: No acute finding. Advanced tricompartmental degenerative disease without marked change.   Original Report Authenticated By: Bernadene Bell. Maricela Curet, M.D.   Past Medical History, Surgical History, Social History, Family History, Problem List, Medications, and Allergies have been reviewed and updated if relevant.  Review of Systems:  GEN: No fevers, chills. Nontoxic. Primarily MSK c/o today. MSK: Detailed in the HPI GI: tolerating PO intake without difficulty Neuro: No numbness, parasthesias, or tingling associated. Otherwise  the pertinent positives of the ROS are noted above.    Physical Examination: There were no vitals filed for this visit.  There is no  height or weight on file to calculate BMI.  AFVSS Pulse 70 rr 20   GEN: WDWN, NAD, Non-toxic, Alert & Oriented x 3 HEENT: Atraumatic, Normocephalic.  Ears and Nose: No external deformity. EXTR: No clubbing/cyanosis/edema NEURO: Normal gait.  PSYCH: Normally interactive. Conversant. Not depressed or anxious appearing.  Calm demeanor.   Knee:  r Gait: Normal heel toe pattern, slight limp ROM: 0-118 Effusion: mild Echymosis or edema: none Patellar tendon NT Painful PLICA: neg Patellar grind: negative Medial and lateral patellar facet loading: painful medial and lateral joint lines mild tenderness Mcmurray's neg Flexion-pinch painful Varus and valgus stress: stable Lachman: neg Ant and Post drawer: neg Hip abduction, IR, ER: WNL Hip flexion str: 5/5 Hip abd: 5/5 Quad: 5/5 VMO atrophy: mild Hamstring concentric and eccentric: 5/5   Assessment and Plan: 1. Osteoarthritis, knee     Unfortunate position with advanced arthritic changes for her age and only age 4. She has not done well with conservative therapy and is still significantly symptomatic.  Consults orthopedic surgery a Eulah Pont Winter for their opinion about whether or not operative intervention may be helpful.

## 2011-06-02 NOTE — Patient Instructions (Signed)
Appt is with Dr. Thurston Hole Monday January 21st @ 3pm [address] [phone] is the phone number [phone] is where i will fax your notes and MRI results

## 2011-07-12 ENCOUNTER — Ambulatory Visit (INDEPENDENT_AMBULATORY_CARE_PROVIDER_SITE_OTHER): Payer: Medicaid Other | Admitting: Cardiology

## 2011-07-12 ENCOUNTER — Encounter: Payer: Self-pay | Admitting: Cardiology

## 2011-07-12 VITALS — BP 155/111 | HR 106 | Resp 16 | Ht 66.0 in | Wt 226.0 lb

## 2011-07-12 DIAGNOSIS — D649 Anemia, unspecified: Secondary | ICD-10-CM

## 2011-07-12 DIAGNOSIS — M1710 Unilateral primary osteoarthritis, unspecified knee: Secondary | ICD-10-CM

## 2011-07-12 DIAGNOSIS — I10 Essential (primary) hypertension: Secondary | ICD-10-CM | POA: Insufficient documentation

## 2011-07-12 DIAGNOSIS — I119 Hypertensive heart disease without heart failure: Secondary | ICD-10-CM | POA: Insufficient documentation

## 2011-07-12 DIAGNOSIS — D509 Iron deficiency anemia, unspecified: Secondary | ICD-10-CM | POA: Insufficient documentation

## 2011-07-12 DIAGNOSIS — IMO0002 Reserved for concepts with insufficient information to code with codable children: Secondary | ICD-10-CM

## 2011-07-12 DIAGNOSIS — G43909 Migraine, unspecified, not intractable, without status migrainosus: Secondary | ICD-10-CM | POA: Insufficient documentation

## 2011-07-12 DIAGNOSIS — E669 Obesity, unspecified: Secondary | ICD-10-CM

## 2011-07-12 LAB — CBC
Platelets: 273 10*3/uL (ref 150–400)
RBC: 5.24 MIL/uL — ABNORMAL HIGH (ref 3.87–5.11)
RDW: 15 % (ref 11.5–15.5)
WBC: 9.7 10*3/uL (ref 4.0–10.5)

## 2011-07-12 LAB — COMPREHENSIVE METABOLIC PANEL
ALT: 11 U/L (ref 0–35)
AST: 14 U/L (ref 0–37)
CO2: 27 mEq/L (ref 19–32)
Calcium: 9.3 mg/dL (ref 8.4–10.5)
Chloride: 98 mEq/L (ref 96–112)
Sodium: 136 mEq/L (ref 135–145)
Total Protein: 7.6 g/dL (ref 6.0–8.3)

## 2011-07-12 MED ORDER — LABETALOL HCL 200 MG PO TABS: ORAL_TABLET | ORAL | Status: DC

## 2011-07-12 NOTE — Assessment & Plan Note (Signed)
Patient has been told of severe degenerative joint disease of both knees.  Treatment has been difficult either due to lack of efficacy or adverse effects.  Injections with hyaluronic acid have not been tried, and/or an alternative therapeutic consideration.  She also has plans to be seen in a local pain clinic.  She has been told that medically she requires TKA, but is too young to reasonably undergo bilateral procedures.

## 2011-07-12 NOTE — Assessment & Plan Note (Signed)
Patient has lost a significant amount of weight.  She is motivated to continue to exercise and restrict calories.  I congratulated her on her efforts to date and encouraged her to continue.

## 2011-07-12 NOTE — Assessment & Plan Note (Signed)
Mild anemia in 2010 had virtually resolved by 09/2010; CBC will be repeated.

## 2011-07-12 NOTE — Assessment & Plan Note (Addendum)
Blood pressure control is suboptimal although patient reports relatively good values most of the time it is measured.  Impeccable control is desirable with a history of CHF related to presumed hypertensive heart disease.  Labetalol dosage will be increased to 400 mg b.i.d.  This may also improve resting tachycardia and marked increase in heart rate with exercise.  She will monitor blood pressure at home and return in one month for reassessment by the cardiology nurses.

## 2011-07-12 NOTE — Patient Instructions (Signed)
Your physician recommends that you schedule a follow-up appointment in: 9 months with Nurse visit for blood pressure check in 2 months  Your physician has requested that you regularly monitor and record your blood pressure readings at home. Please use the same machine at the same time of day to check your readings and record them to bring to your follow-up visit.  Your physician recommends that you return for lab work in: Today  Your physician has recommended you make the following change in your medication:  1 - INCRESE Labetalol to 400 mg twice a day

## 2011-07-12 NOTE — Progress Notes (Signed)
Patient ID: Chelsey Santiago, female   DOB: 02/24/1976, 36 y.o.   MRN: [id number] HPI: [name] was evaluated today well beyond her anticipated return office visit.  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  Injections with local anesthetic and steroids have not been of benefit.  She was treated with a nonsteroidal for a while with improvement, but that resulted in a substantial increase in peripheral edema and blood pressure.  She has previously experienced adverse GI effects with tramadol and has been referred to a pain clinic in [place].  She denies dyspnea, chest discomfort, orthopnea, PND or recent pedal edema.  Prior to Admission medications   Medication Sig Start Date End Date Taking? Authorizing Provider  ALPRAZolam [name]) 1 MG tablet Take 1 mg by mouth at bedtime as needed. For anxiety   Yes Historical Provider, MD  amLODipine (NORVASC) 10 MG tablet Take 1 tablet (10 mg total) by mouth daily. 04/01/11  Yes Joni Reining, NP  buPROPion (WELLBUTRIN XL) 300 MG 24 hr tablet Take 300 mg by mouth daily.   Yes Historical Provider, MD  diclofenac (VOLTAREN) 75 MG EC tablet Take 1 tablet (75 mg total) by mouth 2 (two) times daily. 05/06/11 05/05/12 Yes Spencer Copland, MD  escitalopram (LEXAPRO) 10 MG tablet Take 20 mg by mouth daily.    Yes Historical Provider, MD  furosemide (LASIX) 20 MG tablet Take 1 tablet (20 mg total) by mouth daily as needed. 10/29/10 10/29/11 Yes Joni Reining, NP  labetalol (NORMODYNE) 200 MG tablet Take 1 tablet (200 mg total) by mouth 2 (two) times daily. 04/01/11  Yes Joni Reining, NP  olmesartan-hydrochlorothiazide (BENICAR HCT) 40-25 MG per tablet Take 1 tablet by mouth daily.     Yes Historical Provider, MD  potassium chloride SA (K-DUR,KLOR-CON) 20 MEQ tablet With furosemide  10/29/10  Yes Joni Reining, NP  traZODone (DESYREL) 100 MG tablet Take 100 mg by mouth at bedtime.     Yes Historical Provider, MD     Allergies  Allergen Reactions  . Tramadol Nausea And Vomiting  . Vicodin (Hydrocodone-Acetaminophen) Nausea Only    Past medical history, social history, and family history reviewed and updated.  ROS: See history of present illness.  PHYSICAL EXAM: BP 155/111  Pulse 106  Resp 16  Ht 5\' 6"  (1.676 m)  Wt 102.513 kg (226 lb)  BMI 36.48 kg/m2.  Repeat blood pressure unchanged General-Well developed; no acute distress Body habitus-Moderately overweight Neck-No JVD; no carotid bruits Lungs-clear lung fields; resonant to percussion Cardiovascular-normal PMI; normal S1 and S2; mild tachycardia Abdomen-normal bowel sounds; soft and non-tender without masses or organomegaly Musculoskeletal-No deformities, no cyanosis or clubbing Neurologic-Normal cranial nerves; symmetric strength and tone Skin-Warm, no significant lesions Extremities-distal pulses intact; no edema  ASSESSMENT AND PLAN:   Bing, MD 07/12/2011 12:07 PM

## 2011-07-12 NOTE — Assessment & Plan Note (Signed)
Exercise tolerance is fairly good without other signs or symptoms of CHF.  Control of hypertension alone has been an effective approach to management of her hypertensive heart disease.

## 2011-07-13 ENCOUNTER — Encounter: Payer: Self-pay | Admitting: *Deleted

## 2011-07-30 ENCOUNTER — Encounter (HOSPITAL_COMMUNITY): Payer: Self-pay

## 2011-07-30 ENCOUNTER — Emergency Department (HOSPITAL_COMMUNITY)
Admission: EM | Admit: 2011-07-30 | Discharge: 2011-07-31 | Disposition: A | Discharge status: Home / Self Care | Payer: Medicaid Other | Attending: Emergency Medicine | Admitting: Emergency Medicine

## 2011-07-30 DIAGNOSIS — Z79899 Other long term (current) drug therapy: Secondary | ICD-10-CM | POA: Insufficient documentation

## 2011-07-30 DIAGNOSIS — K0889 Other specified disorders of teeth and supporting structures: Secondary | ICD-10-CM

## 2011-07-30 DIAGNOSIS — IMO0002 Reserved for concepts with insufficient information to code with codable children: Secondary | ICD-10-CM | POA: Insufficient documentation

## 2011-07-30 DIAGNOSIS — K029 Dental caries, unspecified: Secondary | ICD-10-CM | POA: Insufficient documentation

## 2011-07-30 DIAGNOSIS — K1370 Unspecified lesions of oral mucosa: Secondary | ICD-10-CM | POA: Insufficient documentation

## 2011-07-30 DIAGNOSIS — F341 Dysthymic disorder: Secondary | ICD-10-CM | POA: Insufficient documentation

## 2011-07-30 DIAGNOSIS — I10 Essential (primary) hypertension: Secondary | ICD-10-CM

## 2011-07-30 DIAGNOSIS — K089 Disorder of teeth and supporting structures, unspecified: Secondary | ICD-10-CM | POA: Insufficient documentation

## 2011-07-30 DIAGNOSIS — M1710 Unilateral primary osteoarthritis, unspecified knee: Secondary | ICD-10-CM | POA: Insufficient documentation

## 2011-07-30 DIAGNOSIS — I119 Hypertensive heart disease without heart failure: Secondary | ICD-10-CM | POA: Insufficient documentation

## 2011-07-30 NOTE — ED Notes (Signed)
Pt acuity level changed to 2 due to BP.

## 2011-07-30 NOTE — ED Provider Notes (Signed)
History     CSN: [id number]  Arrival date & time 07/30/11  2253   First MD Initiated Contact with Patient 07/30/11 2338      Chief Complaint  Patient presents with  . Dental Pain  . Hypertension    (Consider location/radiation/quality/duration/timing/severity/associated sxs/prior treatment) HPI Comments: Patient also states that her blood pressure is elevated tonight but states she has a hx of elevated blood pressure and her doctor is trying to adjust her medications.  She denies any symptoms tonight other than tooth pain.    Patient is a 36 y.o. female presenting with tooth pain. The history is provided by the patient. No language interpreter was used.  Dental PainThe primary symptoms include mouth pain. Primary symptoms do not include dental injury, oral bleeding, headaches, fever, shortness of breath or angioedema. The symptoms began 2 days ago. The symptoms are unchanged. The symptoms are new. The symptoms occur constantly.  Mouth pain began 24 -48 hours ago. Mouth pain occurs constantly. Affected locations include: teeth and gum(s).  Additional symptoms include: dental sensitivity to temperature and gum tenderness. Additional symptoms do not include: gum swelling, purulent gums, trismus, jaw pain, facial swelling and trouble swallowing. Medical issues do not include: smoking.    Past Medical History  Diagnosis Date  . Hypertension     Lab: Normal BMet except glucose of 118 in 09/2010  . Hypertensive heart disease 2009    Pulmonary edema postpartum; mild to moderate mitral regurgitation when hospitalized for CHF in 2009; Echocardiogram in 12/2009-no MR and normal EF; normal CXR in 09/2010  . Migraine headache   . Anemia     H&H of 10.6/33 and 07/2008 and 11.9/35 and 09/2010  . Osteoarthritis, knee 03/29/2011  . Sleep apnea   . Depression with anxiety   . Fasting hyperglycemia   . Obesity 04/16/2009    Past Surgical History  Procedure Date  . Breast reduction surgery 2002     Family History  Problem Relation Age of Onset  . Diabetes Mother   . Hypertension Maternal Uncle   . Hyperlipidemia Paternal Grandfather   . Sudden death Neg Hx   . Heart attack Neg Hx     History  Substance Use Topics  . Smoking status: Never Smoker   . Smokeless tobacco: Never Used  . Alcohol Use: No    OB History    Grav Para Term Preterm Abortions TAB SAB Ect Mult Living   6 4 4  2  2   4       Review of Systems  Constitutional: Negative for fever, chills and appetite change.  HENT: Positive for dental problem. Negative for facial swelling, trouble swallowing, neck stiffness and ear discharge.   Respiratory: Negative for shortness of breath.   Gastrointestinal: Negative for nausea and vomiting.  Genitourinary: Negative for frequency.  Musculoskeletal: Negative.   Skin: Negative.   Neurological: Negative for dizziness, weakness, numbness and headaches.  Psychiatric/Behavioral: Negative for confusion and decreased concentration.  All other systems reviewed and are negative.    Allergies  Diclofenac; Tramadol; and Vicodin  Home Medications   Current Outpatient Rx  Name Route Sig Dispense Refill  . ALPRAZOLAM 1 MG PO TABS Oral Take 1 mg by mouth at bedtime as needed. For anxiety    . AMLODIPINE BESYLATE 10 MG PO TABS Oral Take 1 tablet (10 mg total) by mouth daily. 30 tablet 3  . BUPROPION HCL ER (XL) 300 MG PO TB24 Oral Take 300 mg by mouth daily.    Marland Kitchen  ESCITALOPRAM OXALATE 10 MG PO TABS Oral Take 20 mg by mouth daily.     . FUROSEMIDE 20 MG PO TABS Oral Take 1 tablet (20 mg total) by mouth daily as needed. 30 tablet 3  . LABETALOL HCL 200 MG PO TABS  400 mg (2 tablets) twice a day 120 tablet 12  . OLMESARTAN MEDOXOMIL-HCTZ 40-25 MG PO TABS Oral Take 1 tablet by mouth daily.      Marland Kitchen POTASSIUM CHLORIDE CRYS ER 20 MEQ PO TBCR  With furosemide  30 tablet 3  . TRAZODONE HCL 100 MG PO TABS Oral Take 100 mg by mouth at bedtime.        BP 151/104  Pulse 93   Temp(Src) 97.9 F (36.6 C) (Oral)  Resp 20  Ht 5\' 6"  (1.676 m)  Wt 225 lb (102.059 kg)  BMI 36.32 kg/m2  SpO2 98%  LMP 07/18/2011  Physical Exam  Nursing note and vitals reviewed. Constitutional: She is oriented to person, place, and time. She appears well-developed and well-nourished. No distress.  HENT:  Head: Normocephalic and atraumatic. No trismus in the jaw.  Mouth/Throat: Uvula is midline, oropharynx is clear and moist and mucous membranes are normal. She does not have dentures. Dental caries present. No dental abscesses or uvula swelling.  Neck: Normal range of motion. Neck supple.  Cardiovascular: Normal rate, regular rhythm, normal heart sounds and intact distal pulses.   No murmur heard. Pulmonary/Chest: Effort normal and breath sounds normal. No respiratory distress.  Musculoskeletal: Normal range of motion. She exhibits no tenderness.  Lymphadenopathy:    She has no cervical adenopathy.  Neurological: She is alert and oriented to person, place, and time. She exhibits normal muscle tone. Coordination normal.  Skin: Skin is warm and dry.  Psychiatric: She has a normal mood and affect.    ED Course  Procedures (including critical care time)       MDM    Patient has ttp of the left upper biscuspid and left lower molar.  No obvious dental abscess, trismus or facial swelling.  Patient is hypertensive but denies any symptoms at this time.  Sees [facility] for her hypertension and medication dosage was changed 2 weeks ago.     I have reviewed the patient previous ED charts and nursing notes.  She was also hypertensive on her last ER visit.    Patient is feeling better. She agrees to close followup with her primary care physician. She also plans to see her dentist next week. Just agrees to return to ER for any worsening symptoms.  Patient / Family / Caregiver understand and agree with initial ED impression and plan with expectations set for ED visit. Pt stable in ED with  no significant deterioration in condition. Pt feels improved after observation and/or treatment in ED.     Tejasvi Brissett L. Britton Perkinson, PA 07/31/11 0100

## 2011-07-30 NOTE — ED Notes (Signed)
Pt presents with left upper and lower dental pain x 2 days. Pt has not seen her dentist.

## 2011-07-31 MED ORDER — PENICILLIN V POTASSIUM 500 MG PO TABS: 500.0000 mg | ORAL_TABLET | Freq: Four times a day (QID) | ORAL | Status: AC

## 2011-07-31 MED ORDER — AMOXICILLIN 250 MG PO CAPS
500.0000 mg | ORAL_CAPSULE | Freq: Once | ORAL | Status: AC
  Administered 2011-07-31: 500 mg via ORAL
  Filled 2011-07-31: qty 2

## 2011-07-31 MED ORDER — HYDROCODONE-ACETAMINOPHEN 5-325 MG PO TABS
1.0000 | ORAL_TABLET | Freq: Once | ORAL | Status: AC
  Administered 2011-07-31: 1 via ORAL
  Filled 2011-07-31: qty 1

## 2011-07-31 MED ORDER — ONDANSETRON 8 MG PO TBDP
8.0000 mg | ORAL_TABLET | Freq: Once | ORAL | Status: AC
  Administered 2011-07-31: 8 mg via ORAL
  Filled 2011-07-31: qty 1

## 2011-07-31 MED ORDER — HYDROCODONE-ACETAMINOPHEN 5-325 MG PO TABS: ORAL_TABLET | ORAL | Status: AC

## 2011-07-31 MED ORDER — PROMETHAZINE HCL 25 MG PO TABS: 25.0000 mg | ORAL_TABLET | Freq: Four times a day (QID) | ORAL | Status: DC | PRN

## 2011-07-31 NOTE — ED Provider Notes (Signed)
Medical screening examination/treatment/procedure(s) were performed by non-physician practitioner and as supervising physician I was immediately available for consultation/collaboration.  Nicoletta Dress. Colon Branch, MD 07/31/11 1000

## 2011-07-31 NOTE — Discharge Instructions (Signed)
Arterial Hypertension Arterial hypertension (high blood pressure) is a condition of elevated pressure in your blood vessels. Hypertension over a long period of time is a risk factor for strokes, heart attacks, and heart failure. It is also the leading cause of kidney (renal) failure.  CAUSES   In Adults -- Over 90% of all hypertension has no known cause. This is called essential or primary hypertension. In the other 10% of people with hypertension, the increase in blood pressure is caused by another disorder. This is called secondary hypertension. Important causes of secondary hypertension are:   Heavy alcohol use.   Obstructive sleep apnea.   Hyperaldosterosim (Conn's syndrome).   Steroid use.   Chronic kidney failure.   Hyperparathyroidism.   Medications.   Renal artery stenosis.   Pheochromocytoma.   Cushing's disease.   Coarctation of the aorta.   Scleroderma renal crisis.   Licorice (in excessive amounts).   Drugs (cocaine, methamphetamine).  Your caregiver can explain any items above that apply to you.  In Children -- Secondary hypertension is more common and should always be considered.   Pregnancy -- Few women of childbearing age have high blood pressure. However, up to 10% of them develop hypertension of pregnancy. Generally, this will not harm the woman. It Even be a sign of 3 complications of pregnancy: preeclampsia, HELLP syndrome, and eclampsia. Follow up and control with medication is necessary.  SYMPTOMS   This condition normally does not produce any noticeable symptoms. It is usually found during a routine exam.   Malignant hypertension is a late problem of high blood pressure. It Monger have the following symptoms:   Headaches.   Blurred vision.   End-organ damage (this means your kidneys, heart, lungs, and other organs are being damaged).   Stressful situations can increase the blood pressure. If a person with normal blood pressure has their blood  pressure go up while being seen by their caregiver, this is often termed "white coat hypertension." Its importance is not known. It Alkire be related with eventually developing hypertension or complications of hypertension.   Hypertension is often confused with mental tension, stress, and anxiety.  DIAGNOSIS  The diagnosis is made by 3 separate blood pressure measurements. They are taken at least 1 week apart from each other. If there is organ damage from hypertension, the diagnosis Lemire be made without repeat measurements. Hypertension is usually identified by having blood pressure readings:  Above 140/90 mmHg measured in both arms, at 3 separate times, over a couple weeks.   Over 130/80 mmHg should be considered a risk factor and Grisanti require treatment in patients with diabetes.  Blood pressure readings over 120/80 mmHg are called "pre-hypertension" even in non-diabetic patients. To get a true blood pressure measurement, use the following guidelines. Be aware of the factors that can alter blood pressure readings.  Take measurements at least 1 hour after caffeine.   Take measurements 30 minutes after smoking and without any stress. This is another reason to quit smoking - it raises your blood pressure.   Use a proper cuff size. Ask your caregiver if you are not sure about your cuff size.   Most home blood pressure cuffs are automatic. They will measure systolic and diastolic pressures. The systolic pressure is the pressure reading at the start of sounds. Diastolic pressure is the pressure at which the sounds disappear. If you are elderly, measure pressures in multiple postures. Try sitting, lying or standing.   Sit at rest for a minimum of   5 minutes before taking measurements.   You should not be on any medications like decongestants. These are found in many cold medications.   Record your blood pressure readings and review them with your caregiver.  If you have hypertension:  Your caregiver  may do tests to be sure you do not have secondary hypertension (see "causes" above).   Your caregiver may also look for signs of metabolic syndrome. This is also called Syndrome X or Insulin Resistance Syndrome. You may have this syndrome if you have type 2 diabetes, abdominal obesity, and abnormal blood lipids in addition to hypertension.   Your caregiver will take your medical and family history and perform a physical exam.   Diagnostic tests may include blood tests (for glucose, cholesterol, potassium, and kidney function), a urinalysis, or an EKG. Other tests may also be necessary depending on your condition.  PREVENTION  There are important lifestyle issues that you can adopt to reduce your chance of developing hypertension:  Maintain a normal weight.   Limit the amount of salt (sodium) in your diet.   Exercise often.   Limit alcohol intake.   Get enough potassium in your diet. Discuss specific advice with your caregiver.   Follow a DASH diet (dietary approaches to stop hypertension). This diet is rich in fruits, vegetables, and low-fat dairy products, and avoids certain fats.  PROGNOSIS  Essential hypertension cannot be cured. Lifestyle changes and medical treatment can lower blood pressure and reduce complications. The prognosis of secondary hypertension depends on the underlying cause. Many people whose hypertension is controlled with medicine or lifestyle changes can live a normal, healthy life.  RISKS AND COMPLICATIONS  While high blood pressure alone is not an illness, it often requires treatment due to its short- and long-term effects on many organs. Hypertension increases your risk for:  CVAs or strokes (cerebrovascular accident).   Heart failure due to chronically high blood pressure (hypertensive cardiomyopathy).   Heart attack (myocardial infarction).   Damage to the retina (hypertensive retinopathy).   Kidney failure (hypertensive nephropathy).  Your caregiver can  explain list items above that apply to you. Treatment of hypertension can significantly reduce the risk of complications. TREATMENT   For overweight patients, weight loss and regular exercise are recommended. Physical fitness lowers blood pressure.   Mild hypertension is usually treated with diet and exercise. A diet rich in fruits and vegetables, fat-free dairy products, and foods low in fat and salt (sodium) can help lower blood pressure. Decreasing salt intake decreases blood pressure in a 1/3 of people.   Stop smoking if you are a smoker.  The steps above are highly effective in reducing blood pressure. While these actions are easy to suggest, they are difficult to achieve. Most patients with moderate or severe hypertension end up requiring medications to bring their blood pressure down to a normal level. There are several classes of medications for treatment. Blood pressure pills (antihypertensives) will lower blood pressure by their different actions. Lowering the blood pressure by 10 mmHg may decrease the risk of complications by as much as 25%. The goal of treatment is effective blood pressure control. This will reduce your risk for complications. Your caregiver will help you determine the best treatment for you according to your lifestyle. What is excellent treatment for one person, may not be for you. HOME CARE INSTRUCTIONS   Do not smoke.   Follow the lifestyle changes outlined in the "Prevention" section.   If you are on medications, follow the directions   carefully. Blood pressure medications must be taken as prescribed. Skipping doses reduces their benefit. It also puts you at risk for problems.   Follow up with your caregiver, as directed.   If you are asked to monitor your blood pressure at home, follow the guidelines in the "Diagnosis" section above.  SEEK MEDICAL CARE IF:   You think you are having medication side effects.   You have recurrent headaches or lightheadedness.     You have swelling in your ankles.   You have trouble with your vision.  SEEK IMMEDIATE MEDICAL CARE IF:   You have sudden onset of chest pain or pressure, difficulty breathing, or other symptoms of a heart attack.   You have a severe headache.   You have symptoms of a stroke (such as sudden weakness, difficulty speaking, difficulty walking).  MAKE SURE YOU:   Understand these instructions.   Will watch your condition.   Will get help right away if you are not doing well or get worse.  Document Released: 05/03/2005 Document Revised: 04/22/2011 Document Reviewed: 12/01/2006 [facility] Patient Information 2012 [place], [place].Dental Pain Toothache is pain in or around a tooth. It may get worse with chewing or with cold or heat.  HOME CARE  Your dentist may use a numbing medicine during treatment. If so, you may need to avoid eating until the medicine wears off. Ask your dentist about this.   Only take medicine as told by your dentist or doctor.   Avoid chewing food near the painful tooth until after all treatment is done. Ask your dentist about this.  GET HELP RIGHT AWAY IF:   The problem gets worse or new problems appear.   You have a fever.   There is redness and puffiness (swelling) of the face, jaw, or neck.   You cannot open your mouth.   There is pain in the jaw.   There is very bad pain that is not helped by medicine.  MAKE SURE YOU:   Understand these instructions.   Will watch your condition.   Will get help right away if you are not doing well or get worse.  Document Released: 10/20/2007 Document Revised: 04/22/2011 Document Reviewed: 10/20/2007 [facility] Patient Information 2012 [place], [place].

## 2011-09-27 ENCOUNTER — Ambulatory Visit (INDEPENDENT_AMBULATORY_CARE_PROVIDER_SITE_OTHER): Payer: Medicaid Other | Admitting: Family Medicine

## 2011-09-27 ENCOUNTER — Encounter: Payer: Self-pay | Admitting: Family Medicine

## 2011-09-27 VITALS — BP 161/104 | HR 97 | Temp 98.2°F | Ht 66.0 in | Wt 236.0 lb

## 2011-09-27 DIAGNOSIS — M179 Osteoarthritis of knee, unspecified: Secondary | ICD-10-CM

## 2011-09-27 DIAGNOSIS — M1710 Unilateral primary osteoarthritis, unspecified knee: Secondary | ICD-10-CM

## 2011-09-27 DIAGNOSIS — F329 Major depressive disorder, single episode, unspecified: Secondary | ICD-10-CM

## 2011-09-27 DIAGNOSIS — N912 Amenorrhea, unspecified: Secondary | ICD-10-CM

## 2011-09-27 DIAGNOSIS — I10 Essential (primary) hypertension: Secondary | ICD-10-CM

## 2011-09-27 DIAGNOSIS — I119 Hypertensive heart disease without heart failure: Secondary | ICD-10-CM

## 2011-09-27 LAB — POCT URINE PREGNANCY: Preg Test, Ur: NEGATIVE

## 2011-09-27 NOTE — Progress Notes (Signed)
  Subjective:    Patient ID: Chelsey Santiago, female    DOB: 1975-11-14, 36 y.o.   MRN: [id number]  HPI  Initial patient visit.   1. Hypertension Followed by [facility].  Compliant with medications. She receives samples of Benicar from them. ROS: denies chest pain, headache  2. Depression Seen by Floydene Flock in [place], [place]. Poorly controlled. On multiple medications. ROS: denies SI/HI  Review of Systems Per HPI.     Objective:   Physical Exam Gen: NAD, obese CV: RRR, no m/r/g Pulm: CTAB without w/r/r, NI WOB Abd: obese, soft, non-tender Ext: no edema    Assessment & Plan:

## 2011-09-27 NOTE — Patient Instructions (Signed)
Follow-up with me or your cardiologist regarding your blood pressure.   It was nice to meet you today.

## 2011-09-28 NOTE — Assessment & Plan Note (Signed)
Poorly controlled per patient, but she has follow-up with [facility] regarding this.

## 2011-09-28 NOTE — Assessment & Plan Note (Signed)
Elevated today. Encouraged follow-up with Cardiologist or here.

## 2011-10-12 ENCOUNTER — Telehealth: Payer: Self-pay | Admitting: Cardiology

## 2011-10-12 NOTE — Telephone Encounter (Signed)
Dr Dietrich Pates aware of injections, per last note.  Advised her that this should not be of issue from a cardiology standpoint.

## 2011-10-12 NOTE — Telephone Encounter (Signed)
Pt has been seeing pain clinic for knee and they want to give her another injection. She is calling to make sure this is okay.

## 2011-12-13 ENCOUNTER — Encounter (HOSPITAL_COMMUNITY): Payer: Self-pay | Admitting: Emergency Medicine

## 2011-12-13 ENCOUNTER — Emergency Department (HOSPITAL_COMMUNITY)
Admission: EM | Admit: 2011-12-13 | Discharge: 2011-12-13 | Disposition: A | Discharge status: Home / Self Care | Payer: Medicaid Other | Attending: Emergency Medicine | Admitting: Emergency Medicine

## 2011-12-13 DIAGNOSIS — S0001XA Abrasion of scalp, initial encounter: Secondary | ICD-10-CM

## 2011-12-13 DIAGNOSIS — IMO0002 Reserved for concepts with insufficient information to code with codable children: Secondary | ICD-10-CM | POA: Insufficient documentation

## 2011-12-13 DIAGNOSIS — W2209XA Striking against other stationary object, initial encounter: Secondary | ICD-10-CM | POA: Insufficient documentation

## 2011-12-13 MED ORDER — ONDANSETRON 8 MG PO TBDP
8.0000 mg | ORAL_TABLET | Freq: Once | ORAL | Status: AC
  Administered 2011-12-13: 8 mg via ORAL
  Filled 2011-12-13: qty 1

## 2011-12-13 NOTE — ED Notes (Signed)
Pt tolerating po liquids without distress

## 2011-12-13 NOTE — ED Notes (Addendum)
Pt has small head laceration on top of head; no bleeding or swelling noted; Neuro WNL

## 2011-12-13 NOTE — ED Notes (Signed)
Pt stable at discharge.  

## 2011-12-13 NOTE — ED Notes (Signed)
Area cleaned and assessed by PA

## 2011-12-13 NOTE — ED Notes (Signed)
Patient states she hit her head on a shelf approximately 1 hour ago and has a small laceration to top of head. Denies LOC.

## 2011-12-17 NOTE — ED Provider Notes (Signed)
Medical screening examination/treatment/procedure(s) were performed by non-physician practitioner and as supervising physician I was immediately available for consultation/collaboration.   Aleia Larocca, MD 12/17/11 2253 

## 2011-12-17 NOTE — ED Provider Notes (Signed)
History     CSN: [id number]  Arrival date & time 12/13/11  2114   First MD Initiated Contact with Patient 12/13/11 2209      Chief Complaint  Patient presents with  . Head Laceration    (Consider location/radiation/quality/duration/timing/severity/associated sxs/prior treatment) HPI Comments: Pt hit the top of her head on the corner of a cabinet when she stood up underneath.  She has a laceration on her scalp. She denies loc,  Dizziness,  Headache, nausea or vomiting.  Patient is a 36 y.o. female presenting with scalp laceration. The history is provided by the patient and the spouse.  Head Laceration This is a new problem. The current episode started today. The problem has been unchanged. Pertinent negatives include no chills, fever, headaches, numbness, vertigo, visual change, vomiting or weakness. Nothing aggravates the symptoms. Treatments tried: direct pressure. Improvement on treatment: Patient obtained hemostatis.    Past Medical History  Diagnosis Date  . Hypertension     Lab: Normal BMet except glucose of 118 in 09/2010  . Hypertensive heart disease 2009    Pulmonary edema postpartum; mild to moderate mitral regurgitation when hospitalized for CHF in 2009; Echocardiogram in 12/2009-no MR and normal EF; normal CXR in 09/2010  . Migraine headache   . Anemia     H&H of 10.6/33 and 07/2008 and 11.9/35 and 09/2010  . Osteoarthritis, knee 03/29/2011  . Sleep apnea   . Depression with anxiety   . Fasting hyperglycemia   . Obesity 04/16/2009    Past Surgical History  Procedure Date  . Breast reduction surgery 2002    Family History  Problem Relation Age of Onset  . Diabetes Mother   . Hypertension Maternal Uncle   . Hyperlipidemia Paternal Grandfather   . Sudden death Neg Hx   . Heart attack Neg Hx     History  Substance Use Topics  . Smoking status: Never Smoker   . Smokeless tobacco: Never Used  . Alcohol Use: Yes     occ    OB History    Grav Para Term  Preterm Abortions TAB SAB Ect Mult Living   6 4 4  2  2   4       Review of Systems  Constitutional: Negative for fever and chills.  HENT: Negative for facial swelling.   Respiratory: Negative for shortness of breath and wheezing.   Gastrointestinal: Negative for vomiting.  Skin: Positive for wound.  Neurological: Negative for vertigo, weakness, numbness and headaches.    Allergies  Diclofenac; Tramadol; and Vicodin  Home Medications   Current Outpatient Rx  Name Route Sig Dispense Refill  . ALPRAZOLAM 1 MG PO TABS Oral Take 1 mg by mouth at bedtime as needed. For anxiety    . AMLODIPINE BESYLATE 10 MG PO TABS Oral Take 1 tablet (10 mg total) by mouth daily. 30 tablet 3  . ESCITALOPRAM OXALATE 10 MG PO TABS Oral Take 20 mg by mouth daily.     . FUROSEMIDE 20 MG PO TABS Oral Take 1 tablet (20 mg total) by mouth daily as needed. 30 tablet 3  . LABETALOL HCL 200 MG PO TABS  400 mg (2 tablets) twice a day 120 tablet 12  . OLMESARTAN MEDOXOMIL-HCTZ 40-25 MG PO TABS Oral Take 1 tablet by mouth daily.      Marland Kitchen POTASSIUM CHLORIDE CRYS ER 20 MEQ PO TBCR  With furosemide  30 tablet 3  . QUETIAPINE FUMARATE 300 MG PO TABS Oral Take 300 mg by  mouth at bedtime.      BP 178/105  Pulse 105  Temp 98 F (36.7 C) (Oral)  Resp 20  Ht 5\' 6"  (1.676 m)  Wt 230 lb (104.327 kg)  BMI 37.12 kg/m2  SpO2 100%  Physical Exam  Constitutional: She is oriented to person, place, and time. She appears well-developed and well-nourished.  HENT:  Head: Normocephalic. Head is with abrasion.  Right Ear: External ear normal. No hemotympanum.  Left Ear: External ear normal. No hemotympanum.       0.5 cm abrasion posterior parietal scalp. Hemostatic.  Cardiovascular: Normal rate.   Pulmonary/Chest: Effort normal.  Neurological: She is alert and oriented to person, place, and time. No cranial nerve deficit or sensory deficit. Coordination and gait normal.  Skin: Abrasion noted.    ED Course  Procedures  (including critical care time)  Labs Reviewed - No data to display No results found.   1. Scalp abrasion    Wound and surrounding area cleaned with peroxide.  Pt slightly nauseated after - given zofran with complete relief.  Ginger ale given prior to dc.    MDM  Reassurance given.   Prn f/u.          Burgess Amor, [place] 12/17/11 2140

## 2011-12-23 ENCOUNTER — Emergency Department (HOSPITAL_COMMUNITY)
Admission: EM | Admit: 2011-12-23 | Discharge: 2011-12-23 | Disposition: A | Discharge status: Home / Self Care | Payer: Medicaid Other | Attending: Emergency Medicine | Admitting: Emergency Medicine

## 2011-12-23 ENCOUNTER — Other Ambulatory Visit: Payer: Self-pay

## 2011-12-23 ENCOUNTER — Encounter (HOSPITAL_COMMUNITY): Payer: Self-pay | Admitting: Emergency Medicine

## 2011-12-23 ENCOUNTER — Emergency Department (HOSPITAL_COMMUNITY): Payer: Medicaid Other

## 2011-12-23 DIAGNOSIS — F329 Major depressive disorder, single episode, unspecified: Secondary | ICD-10-CM | POA: Insufficient documentation

## 2011-12-23 DIAGNOSIS — R51 Headache: Secondary | ICD-10-CM | POA: Insufficient documentation

## 2011-12-23 DIAGNOSIS — IMO0002 Reserved for concepts with insufficient information to code with codable children: Secondary | ICD-10-CM | POA: Insufficient documentation

## 2011-12-23 DIAGNOSIS — Z886 Allergy status to analgesic agent status: Secondary | ICD-10-CM | POA: Insufficient documentation

## 2011-12-23 DIAGNOSIS — Z79899 Other long term (current) drug therapy: Secondary | ICD-10-CM | POA: Insufficient documentation

## 2011-12-23 DIAGNOSIS — F411 Generalized anxiety disorder: Secondary | ICD-10-CM | POA: Insufficient documentation

## 2011-12-23 DIAGNOSIS — F3289 Other specified depressive episodes: Secondary | ICD-10-CM | POA: Insufficient documentation

## 2011-12-23 DIAGNOSIS — R079 Chest pain, unspecified: Secondary | ICD-10-CM | POA: Insufficient documentation

## 2011-12-23 DIAGNOSIS — M1710 Unilateral primary osteoarthritis, unspecified knee: Secondary | ICD-10-CM | POA: Insufficient documentation

## 2011-12-23 DIAGNOSIS — I119 Hypertensive heart disease without heart failure: Secondary | ICD-10-CM | POA: Insufficient documentation

## 2011-12-23 LAB — CBC WITH DIFFERENTIAL/PLATELET
Eosinophils Absolute: 0.1 10*3/uL (ref 0.0–0.7)
Hemoglobin: 10.8 g/dL — ABNORMAL LOW (ref 12.0–15.0)
Lymphocytes Relative: 35 % (ref 12–46)
Lymphs Abs: 2.1 10*3/uL (ref 0.7–4.0)
Monocytes Relative: 4 % (ref 3–12)
Neutro Abs: 3.7 10*3/uL (ref 1.7–7.7)
Neutrophils Relative %: 60 % (ref 43–77)
Platelets: 219 10*3/uL (ref 150–400)
RBC: 4.65 MIL/uL (ref 3.87–5.11)
WBC: 6.1 10*3/uL (ref 4.0–10.5)

## 2011-12-23 LAB — BASIC METABOLIC PANEL
BUN: 9 mg/dL (ref 6–23)
Chloride: 101 mEq/L (ref 96–112)
GFR calc non Af Amer: >90 mL/min (ref >90)
Glucose, Bld: 102 mg/dL — ABNORMAL HIGH (ref 70–99)
Potassium: 3.6 mEq/L (ref 3.5–5.1)
Sodium: 136 mEq/L (ref 135–145)

## 2011-12-23 MED ORDER — HYDROMORPHONE HCL PF 1 MG/ML IJ SOLN
0.5000 mg | Freq: Once | INTRAMUSCULAR | Status: AC
  Administered 2011-12-23: 0.5 mg via INTRAVENOUS
  Filled 2011-12-23: qty 1

## 2011-12-23 MED ORDER — METOCLOPRAMIDE HCL 5 MG/ML IJ SOLN
10.0000 mg | Freq: Once | INTRAMUSCULAR | Status: AC
  Administered 2011-12-23: 10 mg via INTRAVENOUS
  Filled 2011-12-23: qty 2

## 2011-12-23 MED ORDER — OXYCODONE-ACETAMINOPHEN 5-325 MG PO TABS: 1.0000 | ORAL_TABLET | Freq: Four times a day (QID) | ORAL | Status: AC | PRN

## 2011-12-23 MED ORDER — SODIUM CHLORIDE 0.9 % IV BOLUS (SEPSIS)
1000.0000 mL | Freq: Once | INTRAVENOUS | Status: AC
  Administered 2011-12-23: 1000 mL via INTRAVENOUS

## 2011-12-23 MED ORDER — KETOROLAC TROMETHAMINE 30 MG/ML IJ SOLN
30.0000 mg | Freq: Once | INTRAMUSCULAR | Status: AC
  Administered 2011-12-23: 30 mg via INTRAVENOUS
  Filled 2011-12-23: qty 1

## 2011-12-23 MED ORDER — ONDANSETRON HCL 8 MG PO TABS: 8.0000 mg | ORAL_TABLET | ORAL | Status: AC | PRN

## 2011-12-23 NOTE — ED Notes (Signed)
Given crackers, peanut butter and sprite.  Pt reports is feeling better and feels like she can go home.  edp notified.

## 2011-12-23 NOTE — ED Provider Notes (Addendum)
History   This chart was scribed for Donnetta Hutching, MD by Charolett Bumpers . The patient was seen in room APA12/APA12. Patient's care was started at 11:08.    CSN: [id number]  Arrival date & time 12/23/11  1015   None     Chief Complaint  Patient presents with  . Headache  . Chest Pain    (Consider location/radiation/quality/duration/timing/severity/associated sxs/prior treatment) HPI Chelsey Santiago is a 36 y.o. female who presents to the Emergency Department complaining of moderate left-side chest pain with an onset of yesterday. Pt states that yesterday the chest was more intermittent, but reports it has been constant since waking. Pt describes the chest pain as pressure. Pt also reports associated h/a and nosebleeds. Pt states that her headache is located on left-side, periorbital that started 2 days ago. Pt states that she has taken Advil and Goody powders with no relief but states that the normally provide relief. Pt states that her nosebleed was on the left side, that is improving. Pt reports associated mild SOB, moderate diaphoresis and nausea yesterday. Pt reports a medical h/o HTN, anxiety, depression and cardiomegaly. BP here in ED is 157/106  Cardiologist: [place] PCP: Dr. Madolyn Frieze   Past Medical History  Diagnosis Date  . Hypertension     Lab: Normal BMet except glucose of 118 in 09/2010  . Hypertensive heart disease 2009    Pulmonary edema postpartum; mild to moderate mitral regurgitation when hospitalized for CHF in 2009; Echocardiogram in 12/2009-no MR and normal EF; normal CXR in 09/2010  . Migraine headache   . Anemia     H&H of 10.6/33 and 07/2008 and 11.9/35 and 09/2010  . Osteoarthritis, knee 03/29/2011  . Sleep apnea   . Depression with anxiety   . Fasting hyperglycemia   . Obesity 04/16/2009    Past Surgical History  Procedure Date  . Breast reduction surgery 2002    Family History  Problem Relation Age of Onset  . Diabetes Mother   . Hypertension  Maternal Uncle   . Hyperlipidemia Paternal Grandfather   . Sudden death Neg Hx   . Heart attack Neg Hx     History  Substance Use Topics  . Smoking status: Never Smoker   . Smokeless tobacco: Never Used  . Alcohol Use: Yes     occ    OB History    Grav Para Term Preterm Abortions TAB SAB Ect Mult Living   6 4 4  2  2   4       Review of Systems A complete 10 system review of systems was obtained and all systems are negative except as noted in the HPI and PMH.   Allergies  Diclofenac; Tramadol; and Vicodin  Home Medications   Current Outpatient Rx  Name Route Sig Dispense Refill  . ALPRAZOLAM 1 MG PO TABS Oral Take 1 mg by mouth 3 (three) times daily as needed. For anxiety    . AMLODIPINE BESYLATE 10 MG PO TABS Oral Take 1 tablet (10 mg total) by mouth daily. 30 tablet 3  . ESCITALOPRAM OXALATE 20 MG PO TABS Oral Take 20 mg by mouth daily.    . FUROSEMIDE 20 MG PO TABS Oral Take 20 mg by mouth daily as needed. For fluid    . LABETALOL HCL 200 MG PO TABS Oral Take 400 mg by mouth 2 (two) times daily.    Janetta Hora ESTRADIOL 0.4-35 MG-MCG PO TABS Oral Take 1 tablet by mouth daily.    Marland Kitchen  OLMESARTAN MEDOXOMIL-HCTZ 40-25 MG PO TABS Oral Take 1 tablet by mouth daily.      Marland Kitchen POTASSIUM CHLORIDE CRYS ER 20 MEQ PO TBCR Oral Take 20 mEq by mouth as needed. Take when taking furosemide    . QUETIAPINE FUMARATE 300 MG PO TABS Oral Take 300 mg by mouth at bedtime.      BP 157/106  Pulse 98  Temp 98 F (36.7 C) (Oral)  Resp 17  SpO2 100%  LMP 12/18/2011  Physical Exam  Nursing note and vitals reviewed. Constitutional: She is oriented to person, place, and time. She appears well-developed and well-nourished. No distress.  HENT:  Head: Normocephalic and atraumatic.  Eyes: Conjunctivae and EOM are normal. Pupils are equal, round, and reactive to light.  Neck: Normal range of motion. Neck supple. No tracheal deviation present.  Cardiovascular: Normal rate, regular rhythm and  normal heart sounds.   Pulmonary/Chest: Effort normal and breath sounds normal. No respiratory distress. She has no wheezes.  Abdominal: Soft. She exhibits no distension.  Musculoskeletal: Normal range of motion. She exhibits no edema.  Neurological: She is alert and oriented to person, place, and time. No sensory deficit.  Skin: Skin is warm and dry.  Psychiatric: She has a normal mood and affect. Her behavior is normal.    ED Course  Procedures (including critical care time)  DIAGNOSTIC STUDIES: Oxygen Saturation is 100% on room air, normal by my interpretation.    COORDINATION OF CARE:  11:12-Discussed planned course of treatment with the patient including checking cardiac enzymes, blood work, chest x-ray and IV fluids and pain medication, who is agreeable at this time.   11:30-Medication Orders: Sodium chloride 0.9% bolus 1,000 mL-once; Ketorolac (Toradol) 30 mg/mL injection 30 mg-once  Results for orders placed during the hospital encounter of 12/23/11  CBC WITH DIFFERENTIAL      Component Value Range   WBC 6.1  4.0 - 10.5 K/uL   RBC 4.65  3.87 - 5.11 MIL/uL   Hemoglobin 10.8 (*) 12.0 - 15.0 g/dL   HCT 32.4 (*) 40.1 - 02.7 %   MCV 72.7 (*) 78.0 - 100.0 fL   MCH 23.2 (*) 26.0 - 34.0 pg   MCHC 32.0  30.0 - 36.0 g/dL   RDW 25.3  66.4 - 40.3 %   Platelets 219  150 - 400 K/uL   Neutrophils Relative 60  43 - 77 %   Neutro Abs 3.7  1.7 - 7.7 K/uL   Lymphocytes Relative 35  12 - 46 %   Lymphs Abs 2.1  0.7 - 4.0 K/uL   Monocytes Relative 4  3 - 12 %   Monocytes Absolute 0.2  0.1 - 1.0 K/uL   Eosinophils Relative 1  0 - 5 %   Eosinophils Absolute 0.1  0.0 - 0.7 K/uL   Basophils Relative 0  0 - 1 %   Basophils Absolute 0.0  0.0 - 0.1 K/uL  BASIC METABOLIC PANEL      Component Value Range   Sodium 136  135 - 145 mEq/L   Potassium 3.6  3.5 - 5.1 mEq/L   Chloride 101  96 - 112 mEq/L   CO2 25  19 - 32 mEq/L   Glucose, Bld 102 (*) 70 - 99 mg/dL   BUN 9  6 - 23 mg/dL    Creatinine, Ser 4.74  0.50 - 1.10 mg/dL   Calcium 9.5  8.4 - 25.9 mg/dL   GFR calc non Af Amer >90  >90 mL/min  GFR calc Af Amer >90  >90 mL/min  TROPONIN I      Component Value Range   Troponin I <0.30  <0.30 ng/mL    Date: 12/23/2011  Rate: 87 Rhythm: normal sinus rhythm  QRS Axis: normal  Intervals: QT prolonged  ST/T Wave abnormalities: normal  Conduction Disutrbances:none  Narrative Interpretation:   Old EKG Reviewed: none available   Dg Chest Port 1 View  12/23/2011  *RADIOLOGY REPORT*  Clinical Data: Chest pain.  PORTABLE CHEST - 1 VIEW  Comparison: 04/23/2011  Findings: Heart is upper limits normal in size.  No confluent opacities, effusions or edema.  No acute bony abnormality.  IMPRESSION: No acute cardiopulmonary disease.  Original Report Authenticated By: Cyndie Chime, M.D.     No diagnosis found.    MDM  Recheck at 1330.... Patient feeling better. Decreased headache. No chest pain.    I personally performed the services described in this documentation, which was scribed in my presence. The recorded information has been reviewed and considered.      Donnetta Hutching, MD 12/23/11 1338  Donnetta Hutching, MD 02/22/12 1503  Donnetta Hutching, MD 02/22/12 [phone]

## 2011-12-23 NOTE — ED Notes (Signed)
H/a x 2 days. Pain to L side of chest intermittant since yesterday. Denies radiation. Denies dizziness. "a little " sob. Nondiaphoretic. Alert/oriented. nad at this time. Pressure pain to chest. Denies cough

## 2012-02-08 ENCOUNTER — Other Ambulatory Visit: Payer: Self-pay | Admitting: Adult Health

## 2012-03-02 ENCOUNTER — Telehealth: Payer: Self-pay | Admitting: Cardiology

## 2012-03-02 DIAGNOSIS — G4730 Sleep apnea, unspecified: Secondary | ICD-10-CM

## 2012-03-02 NOTE — Telephone Encounter (Signed)
PT IS TAKING BENICAR AND JUST FOUND OUT SHE IS PREGNANT. THEY WANT HE TO GET OFF THIS MEDICATION.

## 2012-03-02 NOTE — Telephone Encounter (Signed)
Note TFC below and advie

## 2012-03-03 ENCOUNTER — Telehealth: Payer: Self-pay | Admitting: *Deleted

## 2012-03-03 MED ORDER — NIFEDIPINE ER OSMOTIC RELEASE 60 MG PO TB24: 60.0000 mg | ORAL_TABLET | Freq: Every day | ORAL | Status: DC

## 2012-03-03 NOTE — Telephone Encounter (Addendum)
STOP NOW.  Follow BP.  Appt with me-next available. She should be in high risk pregnancy clinic, if that is an option.

## 2012-03-03 NOTE — Telephone Encounter (Signed)
Spoke with patient regarding recommendations from OB.  States that she is being seen in high risk pregnancy clinic in [place], who wants her to start Aldomet or Procardia, for treatment of her hypertension.  States that they contemplated hospitalizing her yesterday, however recommended that you review her case and place her on something today and she is to be re evaluated in their office on Monday to assess efficacy of treatment.

## 2012-03-03 NOTE — Telephone Encounter (Signed)
Consulted with Dr Dietrich Pates via [facility] regarding medication regimen.  States to start on Procardia 60 mg daily.  Patient notified and verbalizes understanding and will start on this today and follow up with her OB on Monday.

## 2012-03-07 NOTE — Telephone Encounter (Signed)
I advised starting procardia 60 mg per day approximately 1 week ago by verbal telephone order.

## 2012-03-08 NOTE — Telephone Encounter (Signed)
Patient was notified on 10/18, and script sent.  Duplicate documentation.

## 2012-03-14 ENCOUNTER — Emergency Department (HOSPITAL_COMMUNITY)
Admission: EM | Admit: 2012-03-14 | Discharge: 2012-03-14 | Disposition: A | Discharge status: Home / Self Care | Payer: Medicaid Other | Attending: Emergency Medicine | Admitting: Emergency Medicine

## 2012-03-14 ENCOUNTER — Encounter (HOSPITAL_COMMUNITY): Payer: Self-pay | Admitting: *Deleted

## 2012-03-14 DIAGNOSIS — IMO0002 Reserved for concepts with insufficient information to code with codable children: Secondary | ICD-10-CM | POA: Insufficient documentation

## 2012-03-14 DIAGNOSIS — Z79899 Other long term (current) drug therapy: Secondary | ICD-10-CM | POA: Insufficient documentation

## 2012-03-14 DIAGNOSIS — Z8639 Personal history of other endocrine, nutritional and metabolic disease: Secondary | ICD-10-CM | POA: Insufficient documentation

## 2012-03-14 DIAGNOSIS — G4730 Sleep apnea, unspecified: Secondary | ICD-10-CM | POA: Insufficient documentation

## 2012-03-14 DIAGNOSIS — M1710 Unilateral primary osteoarthritis, unspecified knee: Secondary | ICD-10-CM | POA: Insufficient documentation

## 2012-03-14 DIAGNOSIS — E669 Obesity, unspecified: Secondary | ICD-10-CM | POA: Insufficient documentation

## 2012-03-14 DIAGNOSIS — Z862 Personal history of diseases of the blood and blood-forming organs and certain disorders involving the immune mechanism: Secondary | ICD-10-CM | POA: Insufficient documentation

## 2012-03-14 DIAGNOSIS — I509 Heart failure, unspecified: Secondary | ICD-10-CM | POA: Insufficient documentation

## 2012-03-14 DIAGNOSIS — G43909 Migraine, unspecified, not intractable, without status migrainosus: Secondary | ICD-10-CM | POA: Insufficient documentation

## 2012-03-14 DIAGNOSIS — O9989 Other specified diseases and conditions complicating pregnancy, childbirth and the puerperium: Secondary | ICD-10-CM | POA: Insufficient documentation

## 2012-03-14 DIAGNOSIS — I110 Hypertensive heart disease with heart failure: Secondary | ICD-10-CM | POA: Insufficient documentation

## 2012-03-14 DIAGNOSIS — J40 Bronchitis, not specified as acute or chronic: Secondary | ICD-10-CM | POA: Insufficient documentation

## 2012-03-14 DIAGNOSIS — F341 Dysthymic disorder: Secondary | ICD-10-CM | POA: Insufficient documentation

## 2012-03-14 HISTORY — DX: Encounter for supervision of normal pregnancy, unspecified, unspecified trimester: Z34.90

## 2012-03-14 HISTORY — DX: Heart failure, unspecified: I50.9

## 2012-03-14 MED ORDER — AMOXICILLIN 500 MG PO CAPS: 500.0000 mg | ORAL_CAPSULE | Freq: Three times a day (TID) | ORAL | Status: DC

## 2012-03-14 MED ORDER — BENZONATATE 100 MG PO CAPS: 100.0000 mg | ORAL_CAPSULE | Freq: Three times a day (TID) | ORAL | Status: DC

## 2012-03-14 NOTE — ED Provider Notes (Signed)
History    This chart was scribed for Chelsey Hutching, MD, MD by Smitty Pluck. The patient was seen in room APA19 and the patient's care was started at 12:50PM.   CSN: [id number]  Arrival date & time 03/14/12  1051       Chief Complaint  Patient presents with  . Cough    (Consider location/radiation/quality/duration/timing/severity/associated sxs/prior treatment) Patient is a 36 y.o. female presenting with cough. The history is provided by the patient. No language interpreter was used.  Cough   Chelsey Santiago is a 36 y.o. female who presents to the Emergency Department complaining of constant, moderate productive cough onset 3 days ago. Reports she has yellow sputum with intermittent streaks of blood. Pt denies taking medication PTA. Pt is12 weeks pregnant and is monitored by [facility]. This is her 5th pregnancy.  Pt denies fever, nausea, vomiting, diarrhea and any other pain.     Past Medical History  Diagnosis Date  . Hypertension     Lab: Normal BMet except glucose of 118 in 09/2010  . Hypertensive heart disease 2009    Pulmonary edema postpartum; mild to moderate mitral regurgitation when hospitalized for CHF in 2009; Echocardiogram in 12/2009-no MR and normal EF; normal CXR in 09/2010  . Migraine headache   . Anemia     H&H of 10.6/33 and 07/2008 and 11.9/35 and 09/2010  . Osteoarthritis, knee 03/29/2011  . Sleep apnea   . Depression with anxiety   . Fasting hyperglycemia   . Obesity 04/16/2009  . CHF (congestive heart failure)   . Pregnant     Past Surgical History  Procedure Date  . Breast reduction surgery 2002    Family History  Problem Relation Age of Onset  . Diabetes Mother   . Hypertension Maternal Uncle   . Hyperlipidemia Paternal Grandfather   . Sudden death Neg Hx   . Heart attack Neg Hx     History  Substance Use Topics  . Smoking status: Never Smoker   . Smokeless tobacco: Never Used  . Alcohol Use: Yes     occ    OB History    Grav Para Term Preterm Abortions TAB SAB Ect Mult Living   7 4 4  2  2   4       Review of Systems  All other systems reviewed and are negative.  10 Systems reviewed and all are negative for acute change except as noted in the HPI.    Allergies  Diclofenac; Tramadol; and Vicodin  Home Medications   Current Outpatient Rx  Name Route Sig Dispense Refill  . ALPRAZOLAM 1 MG PO TABS Oral Take 1 mg by mouth 3 (three) times daily as needed. For anxiety    . AMLODIPINE BESYLATE 10 MG PO TABS Oral Take 1 tablet (10 mg total) by mouth daily. 30 tablet 3  . ESCITALOPRAM OXALATE 20 MG PO TABS Oral Take 20 mg by mouth daily.    . FUROSEMIDE 20 MG PO TABS Oral Take 20 mg by mouth daily as needed. For fluid    . FUROSEMIDE 20 MG PO TABS  TAKE 1 TABLET BY MOUTH EVERY DAY AS NEEDED 30 tablet 4  . LABETALOL HCL 200 MG PO TABS Oral Take 400 mg by mouth 2 (two) times daily.    Marland Kitchen NIFEDIPINE ER OSMOTIC 60 MG PO TB24 Oral Take 1 tablet (60 mg total) by mouth daily. 30 tablet 6  . NORETHINDRONE-ETH ESTRADIOL 0.4-35 MG-MCG PO TABS Oral  Take 1 tablet by mouth daily.    Marland Kitchen POTASSIUM CHLORIDE CRYS ER 20 MEQ PO TBCR Oral Take 20 mEq by mouth as needed. Take when taking furosemide    . QUETIAPINE FUMARATE 300 MG PO TABS Oral Take 300 mg by mouth at bedtime.      BP 130/86  Pulse 111  Temp 99 F (37.2 C) (Oral)  Resp 20  Ht 5\' 6"  (1.676 m)  Wt 225 lb (102.059 kg)  BMI 36.32 kg/m2  SpO2 99%  LMP 12/19/2011  Physical Exam  Nursing note and vitals reviewed. Constitutional: She is oriented to person, place, and time. She appears well-developed and well-nourished.  HENT:  Head: Normocephalic and atraumatic.  Eyes: Conjunctivae normal and EOM are normal. Pupils are equal, round, and reactive to light.  Neck: Normal range of motion. Neck supple.  Cardiovascular: Normal rate, regular rhythm and normal heart sounds.   Pulmonary/Chest: Effort normal and breath sounds normal. No respiratory distress. She has  no wheezes. She has no rales.  Abdominal: Soft. Bowel sounds are normal.  Musculoskeletal: Normal range of motion.  Neurological: She is alert and oriented to person, place, and time.  Skin: Skin is warm and dry.  Psychiatric: She has a normal mood and affect.    ED Course  Procedures (including critical care time) DIAGNOSTIC STUDIES: Oxygen Saturation is 99% on room air, normal by my interpretation.    COORDINATION OF CARE: 12:51 PM Discussed ED treatment with pt (abx)    Labs Reviewed - No data to display No results found.   No diagnosis found.    MDM  Patient is hemodynamically stable.  No respiratory distress. No wheezing. Good oxygenation. Chest x-ray not necessary.  Discharge him on amoxicillin 500 mg #30 and Tessalon Perles #20     I personally performed the services described in this documentation, which was scribed in my presence. The recorded information has been reviewed and considered.    Chelsey Hutching, MD 03/14/12 1319

## 2012-03-14 NOTE — ED Notes (Signed)
Cough, yellow sputum with streaks of blood at times.  No fever.  No NVD.

## 2012-03-14 NOTE — ED Notes (Signed)
Pt reports cough and congestion for several days.  

## 2012-03-16 ENCOUNTER — Encounter: Payer: Self-pay | Admitting: Family Medicine

## 2012-03-16 ENCOUNTER — Ambulatory Visit (INDEPENDENT_AMBULATORY_CARE_PROVIDER_SITE_OTHER): Payer: Medicaid Other | Admitting: Family Medicine

## 2012-03-16 VITALS — BP 142/100 | HR 59 | Temp 98.4°F | Ht 66.0 in | Wt 232.0 lb

## 2012-03-16 DIAGNOSIS — Z331 Pregnant state, incidental: Secondary | ICD-10-CM

## 2012-03-16 DIAGNOSIS — Z3490 Encounter for supervision of normal pregnancy, unspecified, unspecified trimester: Secondary | ICD-10-CM | POA: Insufficient documentation

## 2012-03-16 DIAGNOSIS — N912 Amenorrhea, unspecified: Secondary | ICD-10-CM

## 2012-03-16 LAB — POCT URINE PREGNANCY: Preg Test, Ur: POSITIVE
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
## 2012-03-16 NOTE — Patient Instructions (Addendum)
Please sign up for pregnancy medicaid today  When you are approved, call and make an appointment at our office and please also request that I schedule an ultrasound for you at that time  Start taking prenatal vitamins daily

## 2012-03-28 ENCOUNTER — Other Ambulatory Visit: Payer: Self-pay | Admitting: Family Medicine

## 2012-03-28 DIAGNOSIS — Z3490 Encounter for supervision of normal pregnancy, unspecified, unspecified trimester: Secondary | ICD-10-CM

## 2012-03-28 NOTE — Addendum Note (Signed)
Addended by: Garen Grams F on: 03/28/2012 03:42 PM   Modules accepted: Orders

## 2012-03-31 ENCOUNTER — Ambulatory Visit (HOSPITAL_COMMUNITY): Payer: Medicaid Other

## 2012-04-04 ENCOUNTER — Ambulatory Visit (HOSPITAL_COMMUNITY)
Admission: RE | Admit: 2012-04-04 | Discharge: 2012-04-04 | Disposition: A | Discharge status: Home / Self Care | Payer: Medicaid Other | Source: Ambulatory Visit | Attending: Family Medicine | Admitting: Family Medicine

## 2012-04-04 DIAGNOSIS — Z3490 Encounter for supervision of normal pregnancy, unspecified, unspecified trimester: Secondary | ICD-10-CM

## 2012-04-04 DIAGNOSIS — O10019 Pre-existing essential hypertension complicating pregnancy, unspecified trimester: Secondary | ICD-10-CM | POA: Insufficient documentation

## 2012-04-04 DIAGNOSIS — Z3689 Encounter for other specified antenatal screening: Secondary | ICD-10-CM | POA: Insufficient documentation

## 2012-04-11 ENCOUNTER — Encounter (HOSPITAL_COMMUNITY): Payer: Self-pay | Admitting: *Deleted

## 2012-04-11 ENCOUNTER — Emergency Department (HOSPITAL_COMMUNITY)
Admission: EM | Admit: 2012-04-11 | Discharge: 2012-04-11 | Disposition: A | Discharge status: Home / Self Care | Payer: Medicaid Other | Attending: Emergency Medicine | Admitting: Emergency Medicine

## 2012-04-11 DIAGNOSIS — Z8679 Personal history of other diseases of the circulatory system: Secondary | ICD-10-CM | POA: Insufficient documentation

## 2012-04-11 DIAGNOSIS — Z8742 Personal history of other diseases of the female genital tract: Secondary | ICD-10-CM | POA: Insufficient documentation

## 2012-04-11 DIAGNOSIS — Z8739 Personal history of other diseases of the musculoskeletal system and connective tissue: Secondary | ICD-10-CM | POA: Insufficient documentation

## 2012-04-11 DIAGNOSIS — Z862 Personal history of diseases of the blood and blood-forming organs and certain disorders involving the immune mechanism: Secondary | ICD-10-CM | POA: Insufficient documentation

## 2012-04-11 DIAGNOSIS — O9989 Other specified diseases and conditions complicating pregnancy, childbirth and the puerperium: Secondary | ICD-10-CM | POA: Insufficient documentation

## 2012-04-11 DIAGNOSIS — R040 Epistaxis: Secondary | ICD-10-CM

## 2012-04-11 DIAGNOSIS — I10 Essential (primary) hypertension: Secondary | ICD-10-CM | POA: Insufficient documentation

## 2012-04-11 DIAGNOSIS — R51 Headache: Secondary | ICD-10-CM

## 2012-04-11 DIAGNOSIS — I509 Heart failure, unspecified: Secondary | ICD-10-CM | POA: Insufficient documentation

## 2012-04-11 DIAGNOSIS — Z3490 Encounter for supervision of normal pregnancy, unspecified, unspecified trimester: Secondary | ICD-10-CM

## 2012-04-11 DIAGNOSIS — F341 Dysthymic disorder: Secondary | ICD-10-CM | POA: Insufficient documentation

## 2012-04-11 DIAGNOSIS — Z79899 Other long term (current) drug therapy: Secondary | ICD-10-CM | POA: Insufficient documentation

## 2012-04-11 MED ORDER — SODIUM CHLORIDE 0.9 % IV BOLUS (SEPSIS)
1000.0000 mL | Freq: Once | INTRAVENOUS | Status: AC
  Administered 2012-04-11: 1000 mL via INTRAVENOUS

## 2012-04-11 MED ORDER — METOCLOPRAMIDE HCL 10 MG PO TABS: 10.0000 mg | ORAL_TABLET | Freq: Four times a day (QID) | ORAL | Status: DC | PRN

## 2012-04-11 MED ORDER — DIPHENHYDRAMINE HCL 50 MG/ML IJ SOLN
25.0000 mg | Freq: Once | INTRAMUSCULAR | Status: DC
  Filled 2012-04-11: qty 1

## 2012-04-11 MED ORDER — METOCLOPRAMIDE HCL 5 MG/ML IJ SOLN
10.0000 mg | Freq: Once | INTRAMUSCULAR | Status: AC
  Administered 2012-04-11: 10 mg via INTRAVENOUS
  Filled 2012-04-11: qty 2

## 2012-04-11 NOTE — ED Notes (Signed)
Alert, in no distress.  Pt is texting on cell phone during IV start and med administration.

## 2012-04-11 NOTE — ED Notes (Signed)
Instructions, prescriptions and f/u care reviewed - verbalizes understanding.

## 2012-04-11 NOTE — ED Notes (Addendum)
C/o HA x 2 days; reports small amount of bleeding from nose upon waking this morning, but has since stopped. C/o nausea with HA, but denies vomiting.  States has not taken anything for HA pain. Pt is pregnant, EDD October 28, 2012.

## 2012-04-11 NOTE — ED Provider Notes (Signed)
History  This chart was scribed for Chelsey Booze, MD by Manuela Schwartz, ED scribe. This patient was seen in room APA06/APA06 and the patient's care was started at 0921.   CSN: [id number]  Arrival date & time 04/11/12  4098   First MD Initiated Contact with Patient 04/11/12 [phone]      Chief Complaint  Patient presents with  . Headache   The history is provided by the patient. No language interpreter was used.   Chelsey Santiago is a 36 y.o. female who presents to the Emergency Department complaining of 5/10 constant left frontal HA x 2 days; reports small amount of bleeding from nose upon waking this morning, but has since stopped. She states taking goody powders w/out relief from HA. She states associated phonophobia, blurry vision, nausea. She denies photophobia, numbness/weakness.  She is G9, P4 with two abortions and two miscarriages.  Past Medical History  Diagnosis Date  . Hypertension     Lab: Normal BMet except glucose of 118 in 09/2010  . Hypertensive heart disease 2009    Pulmonary edema postpartum; mild to moderate mitral regurgitation when hospitalized for CHF in 2009; Echocardiogram in 12/2009-no MR and normal EF; normal CXR in 09/2010  . Migraine headache   . Anemia     H&H of 10.6/33 and 07/2008 and 11.9/35 and 09/2010  . Osteoarthritis, knee 03/29/2011  . Sleep apnea   . Depression with anxiety   . Fasting hyperglycemia   . Obesity 04/16/2009  . CHF (congestive heart failure)   . Pregnant     Past Surgical History  Procedure Date  . Breast reduction surgery 2002    Family History  Problem Relation Age of Onset  . Diabetes Mother   . Hypertension Maternal Uncle   . Hyperlipidemia Paternal Grandfather   . Sudden death Neg Hx   . Heart attack Neg Hx     History  Substance Use Topics  . Smoking status: Never Smoker   . Smokeless tobacco: Never Used  . Alcohol Use: Yes     Comment: occ    OB History    Grav Para Term Preterm Abortions TAB SAB Ect Mult  Living   7 4 4  2  2   4       Review of Systems  Constitutional: Negative for fever and chills.  HENT: Positive for nosebleeds.   Eyes: Negative for photophobia.  Respiratory: Negative for shortness of breath.   Gastrointestinal: Negative for nausea and vomiting.  Neurological: Positive for headaches. Negative for weakness.  All other systems reviewed and are negative.    Allergies  Diclofenac; Tramadol; and Vicodin  Home Medications   Current Outpatient Rx  Name  Route  Sig  Dispense  Refill  . ALPRAZOLAM 1 MG PO TABS   Oral   Take 1 mg by mouth 3 (three) times daily as needed. For anxiety         . AMLODIPINE BESYLATE 10 MG PO TABS   Oral   Take 1 tablet (10 mg total) by mouth daily.   30 tablet   3   . AMOXICILLIN 500 MG PO CAPS   Oral   Take 1 capsule (500 mg total) by mouth 3 (three) times daily.   30 capsule   0   . BENZONATATE 100 MG PO CAPS   Oral   Take 1 capsule (100 mg total) by mouth every 8 (eight) hours.   20 capsule   0   . ESCITALOPRAM OXALATE  20 MG PO TABS   Oral   Take 20 mg by mouth daily.         . FUROSEMIDE 20 MG PO TABS   Oral   Take 20 mg by mouth daily as needed. For fluid         . LABETALOL HCL 200 MG PO TABS   Oral   Take 400 mg by mouth 2 (two) times daily.         Marland Kitchen NIFEDIPINE ER OSMOTIC 60 MG PO TB24   Oral   Take 1 tablet (60 mg total) by mouth daily.   30 tablet   6   . POTASSIUM CHLORIDE CRYS ER 20 MEQ PO TBCR   Oral   Take 20 mEq by mouth as needed. Take when taking furosemide           Triage Vitals: BP 127/84  Pulse 98  Temp 98.2 F (36.8 C)  Resp 20  Ht 5\' 6"  (1.676 m)  Wt 229 lb (103.874 kg)  BMI 36.96 kg/m2  SpO2 99%  LMP 12/19/2011  Physical Exam  Nursing note and vitals reviewed. Constitutional: She is oriented to person, place, and time. She appears well-developed and well-nourished. No distress.  HENT:  Head: Normocephalic and atraumatic.       Bleeding site noted on left side of  nasal septum without active bleeding.  Eyes: EOM are normal.       Fundi are normal.  Neck: Neck supple. No tracheal deviation present.  Cardiovascular: Normal rate.   Pulmonary/Chest: Effort normal. No respiratory distress.  Musculoskeletal: Normal range of motion.  Neurological: She is alert and oriented to person, place, and time.  Skin: Skin is warm and dry.  Psychiatric: She has a normal mood and affect. Her behavior is normal.    ED Course  Procedures (including critical care time) DIAGNOSTIC STUDIES: Oxygen Saturation is 99% on room air, normal by my interpretation.    COORDINATION OF CARE: At 940 AM Discussed treatment plan with patient which includes IV fluids, reglan, benadryl. Patient agrees.     1. Headache   2. Epistaxis   3. Pregnancy       MDM  Headache which is most likely migraine variant. Epistaxis which has resolved and does not require any additional treatment at this time. First semester pregnancy. She'll be given headache cocktail and reassessed.  After IV fluids and IV metoclopramide, she states her headache is completely gone. Diphenhydramine was not given because she is driving herself home. She is discharged with prescription for metoclopramide.   I personally performed the services described in this documentation, which was scribed in my presence. The recorded information has been reviewed and is accurate.           Chelsey Booze, MD 04/11/12 1102

## 2012-05-03 LAB — OB RESULTS CONSOLE RPR: RPR: NONREACTIVE

## 2012-05-03 LAB — OB RESULTS CONSOLE ANTIBODY SCREEN: Antibody Screen: NEGATIVE

## 2012-05-11 ENCOUNTER — Encounter: Payer: Medicaid Other | Admitting: Family Medicine

## 2012-05-11 ENCOUNTER — Ambulatory Visit: Payer: Medicaid Other | Admitting: Adult Health

## 2012-05-17 NOTE — L&D Delivery Note (Signed)
Delivery Note Pt progresses rapidly from 5.5> C/C/+2. After 1 contraction, at 6:04 AM a viable female was delivered via Vaginal, Spontaneous Delivery (Presentation: OP ).  APGAR: ~5; weight - pending.  Placenta status: Delivered 0615, Cord: 3 vessel with the following complications: Baby to NICU given 1 min Apgar and poor respiratory effort  Anesthesia:  Epidural Episiotomy: None Lacerations: None Est. Blood Loss (mL): 200 mL  Mom to postpartum.  Baby to NICU.  [name] 10/10/2012, 6:18 AM   I was present for delivery and agree with above

## 2012-05-17 NOTE — L&D Delivery Note (Signed)
Attestation of Attending Supervision of Advanced Practitioner (CNM/NP): Evaluation and management procedures were performed by the Advanced Practitioner under my supervision and collaboration.  I have reviewed the Advanced Practitioner's note and chart, and I agree with the management and plan.  Chelsey Santiago 10/16/2012 10:14 AM

## 2012-05-23 ENCOUNTER — Encounter: Payer: Self-pay | Admitting: Adult Health

## 2012-05-23 ENCOUNTER — Ambulatory Visit (INDEPENDENT_AMBULATORY_CARE_PROVIDER_SITE_OTHER): Payer: Medicaid Other | Admitting: Adult Health

## 2012-05-23 VITALS — BP 160/90 | HR 92 | Ht 66.0 in | Wt 244.5 lb

## 2012-05-23 DIAGNOSIS — I119 Hypertensive heart disease without heart failure: Secondary | ICD-10-CM

## 2012-05-23 MED ORDER — LABETALOL HCL 200 MG PO TABS: 400.0000 mg | ORAL_TABLET | Freq: Two times a day (BID) | ORAL | Status: DC
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
## 2012-05-23 NOTE — Progress Notes (Signed)
   HPI: Chelsey Santiago is a 37 y/o patient of Dr.Rothbart we are seeing for ongoing assessment and treatment of hypertensive heart disease. On last visit labatolol was increased from 200mg  BID to 400mg  BID and procardia was increased to 90 mg from 60 mg daily. She comes today [redacted] weeks pregnant, and 2 weeks after her mother has passed away. She has not been taking her medications as directed, and labetalol is only being taken at 300mg  BID. She is also complaining of a GI bug, with frequent diarrhea. She is very lethargic this visit. No complaints of chest discomfort or dizziness. She is followed by Dr.Ferguson for her pregnancy.  Allergies  Allergen Reactions  . Diclofenac     Edema  . Tramadol Nausea And Vomiting  . Vicodin (Hydrocodone-Acetaminophen) Nausea Only    Current Outpatient Prescriptions  Medication Sig Dispense Refill  . Aspirin-Acetaminophen-Caffeine (GOODY HEADACHE PO) Take 1 Package by mouth 3 (three) times daily as needed. Headache.      . escitalopram (LEXAPRO) 20 MG tablet Take 20 mg by mouth daily.      Marland Kitchen labetalol (NORMODYNE) 200 MG tablet Take 2 tablets (400 mg total) by mouth 2 (two) times daily.  120 tablet  6  . NIFEdipine (PROCARDIA XL/ADALAT-CC) 90 MG 24 hr tablet Take 90 mg by mouth daily.      Marland Kitchen ALPRAZolam (XANAX) 1 MG tablet Take 1 mg by mouth 3 (three) times daily as needed. For anxiety        Past Medical History  Diagnosis Date  . Hypertension     Lab: Normal BMet except glucose of 118 in 09/2010  . Hypertensive heart disease 2009    Pulmonary edema postpartum; mild to moderate mitral regurgitation when hospitalized for CHF in 2009; Echocardiogram in 12/2009-no MR and normal EF; normal CXR in 09/2010  . Migraine headache   . Anemia     H&H of 10.6/33 and 07/2008 and 11.9/35 and 09/2010  . Osteoarthritis, knee 03/29/2011  . Sleep apnea   . Depression with anxiety   . Fasting hyperglycemia   . Obesity 04/16/2009  . CHF (congestive heart failure)   . Pregnant      Past Surgical History  Procedure Date  . Breast reduction surgery 2002    ZOX:[id number] of systems complete and found to be negative unless listed above  PHYSICAL EXAM BP 160/90  Pulse 92  Ht 5\' 6"  (1.676 m)  Wt 244 lb 8 oz (110.904 kg)  BMI 39.46 kg/m2  SpO2 98%  LMP 12/19/2011  General: Well developed, obese, well nourished, in no acute distress Head: Eyes PERRLA, No xanthomas.   Normal cephalic and atramatic  Lungs: Clear bilaterally to auscultation and percussion. Heart: HRRR S1 S2, tachycardic, without MRG.  Pulses are 2+ & equal.            No carotid bruit. No JVD.  No abdominal bruits. No femoral bruits. Abdomen: Bowel sounds are positive, abdomen soft and non-tender without masses or                  Hernia's noted. Msk:  Back normal, normal gait. Normal strength and tone for age. Extremities: No clubbing, cyanosis or edema.  DP +1 Neuro: Alert and oriented X 3. Psych:  Good affect, responds appropriately    ASSESSMENT AND PLAN

## 2012-05-23 NOTE — Patient Instructions (Addendum)
Your physician recommends that you schedule a follow-up appointment in: 2 months  Your physician has recommended you make the following change in your medication:  1 - TAKE Labetalol 400 mg twice a day 2 - STOP Goody powder

## 2012-05-23 NOTE — Assessment & Plan Note (Signed)
Her BP is not well controlled. I have retaken the BP manually in the exam room and found it to be 162./100 with HR of 98.  I will increase labetalol to 400 mg BID and have asked her not to take the Goody Powders during her pregnancy. She will continue procardia 90- mg as directed. She will follow up with Dr.Rothbart in 2 months for carefull monitoring of her BP during pregnancy to avoid CHF or hypertensive urgency.

## 2012-06-07 ENCOUNTER — Encounter: Payer: Self-pay | Admitting: *Deleted

## 2012-06-07 ENCOUNTER — Encounter: Payer: Medicaid Other | Attending: Obstetrics & Gynecology | Admitting: *Deleted

## 2012-06-07 DIAGNOSIS — O99810 Abnormal glucose complicating pregnancy: Secondary | ICD-10-CM | POA: Insufficient documentation

## 2012-06-07 DIAGNOSIS — Z713 Dietary counseling and surveillance: Secondary | ICD-10-CM | POA: Insufficient documentation

## 2012-06-07 NOTE — Patient Instructions (Signed)
Goals:  Check glucose levels per MD as instructed  Follow Gestational Diabetes Diet as instructed  Call for follow-up as needed    

## 2012-06-07 NOTE — Progress Notes (Signed)
  Patient was seen on 06/07/2012 for Gestational Diabetes self-management class at the [facility]. The following learning objectives were met by the patient during this course:   States the definition of Gestational Diabetes  States why dietary management is important in controlling blood glucose  Describes the effects each nutrient has on blood glucose levels  Demonstrates ability to create a balanced meal plan  Demonstrates carbohydrate counting   States when to check blood glucose levels  Demonstrates proper blood glucose monitoring techniques  States the effect of stress and exercise on blood glucose levels  States the importance of limiting caffeine and abstaining from alcohol and smoking  Blood glucose monitor given: Accu Chek Nano BG Monitoring Kit Lot # [id number] Exp: 09/13/13 Blood glucose reading: 160 and she took it again and it was 162 mg/dl  Patient instructed to monitor glucose levels: FBS: 60 - <90 2 hour: <120  *Patient received handouts:  Nutrition Diabetes and Pregnancy  Carbohydrate Counting List  Patient will be seen for follow-up as needed.

## 2012-07-13 ENCOUNTER — Encounter: Payer: Self-pay | Admitting: *Deleted

## 2012-07-21 ENCOUNTER — Ambulatory Visit: Payer: Medicaid Other | Admitting: Cardiology

## 2012-07-27 ENCOUNTER — Other Ambulatory Visit: Payer: Self-pay | Admitting: Obstetrics & Gynecology

## 2012-08-04 ENCOUNTER — Ambulatory Visit (INDEPENDENT_AMBULATORY_CARE_PROVIDER_SITE_OTHER): Payer: Medicaid Other | Admitting: Obstetrics & Gynecology

## 2012-08-04 VITALS — BP 140/80 | Wt 250.0 lb

## 2012-08-04 DIAGNOSIS — O24919 Unspecified diabetes mellitus in pregnancy, unspecified trimester: Secondary | ICD-10-CM | POA: Insufficient documentation

## 2012-08-04 DIAGNOSIS — O360990 Maternal care for other rhesus isoimmunization, unspecified trimester, not applicable or unspecified: Secondary | ICD-10-CM

## 2012-08-04 DIAGNOSIS — Z3490 Encounter for supervision of normal pregnancy, unspecified, unspecified trimester: Secondary | ICD-10-CM

## 2012-08-04 DIAGNOSIS — Z331 Pregnant state, incidental: Secondary | ICD-10-CM

## 2012-08-04 DIAGNOSIS — I10 Essential (primary) hypertension: Secondary | ICD-10-CM

## 2012-08-04 DIAGNOSIS — I119 Hypertensive heart disease without heart failure: Secondary | ICD-10-CM

## 2012-08-04 DIAGNOSIS — O09529 Supervision of elderly multigravida, unspecified trimester: Secondary | ICD-10-CM

## 2012-08-04 DIAGNOSIS — O24913 Unspecified diabetes mellitus in pregnancy, third trimester: Secondary | ICD-10-CM

## 2012-08-04 DIAGNOSIS — O10019 Pre-existing essential hypertension complicating pregnancy, unspecified trimester: Secondary | ICD-10-CM

## 2012-08-04 DIAGNOSIS — Z1389 Encounter for screening for other disorder: Secondary | ICD-10-CM

## 2012-08-04 LAB — POCT URINALYSIS DIPSTICK
Leukocytes, UA: NEGATIVE
Nitrite, UA: NEGATIVE
Protein, UA: NEGATIVE

## 2012-08-04 NOTE — Progress Notes (Signed)
Cramping

## 2012-08-04 NOTE — Progress Notes (Signed)
Chelsey Santiago came in complaining of some cramping at top of uterus.  Episodic none since yesterdy.  No bleeding or gushes of fluid.  Good fetal movement. Instructed to get off feet nd drink lots of water if recurs.

## 2012-08-04 NOTE — Assessment & Plan Note (Signed)
Labetalol 400 BID

## 2012-08-04 NOTE — Patient Instructions (Signed)
Pregnancy - Second Trimester The second trimester of pregnancy (3 to 6 months) is a period of rapid growth for you and your baby. At the end of the sixth month, your baby is about 9 inches long and weighs 1 1/2 pounds. You will begin to feel the baby move between 18 and 20 weeks of the pregnancy. This is called quickening. Weight gain is faster. A clear fluid (colostrum) may leak out of your breasts. You may feel small contractions of the womb (uterus). This is known as false labor or Braxton-Hicks contractions. This is like a practice for labor when the baby is ready to be born. Usually, the problems with morning sickness have usually passed by the end of your first trimester. Some women develop small dark blotches (called cholasma, mask of pregnancy) on their face that usually goes away after the baby is born. Exposure to the sun makes the blotches worse. Acne may also develop in some pregnant women and pregnant women who have acne, may find that it goes away. PRENATAL EXAMS  Blood work may continue to be done during prenatal exams. These tests are done to check on your health and the probable health of your baby. Blood work is used to follow your blood levels (hemoglobin). Anemia (low hemoglobin) is common during pregnancy. Iron and vitamins are given to help prevent this. You will also be checked for diabetes between 24 and 28 weeks of the pregnancy. Some of the previous blood tests may be repeated.  The size of the uterus is measured during each visit. This is to make sure that the baby is continuing to grow properly according to the dates of the pregnancy.  Your blood pressure is checked every prenatal visit. This is to make sure you are not getting toxemia.  Your urine is checked to make sure you do not have an infection, diabetes or protein in the urine.  Your weight is checked often to make sure gains are happening at the suggested rate. This is to ensure that both you and your baby are growing  normally.  Sometimes, an ultrasound is performed to confirm the proper growth and development of the baby. This is a test which bounces harmless sound waves off the baby so your caregiver can more accurately determine due dates. Sometimes, a specialized test is done on the amniotic fluid surrounding the baby. This test is called an amniocentesis. The amniotic fluid is obtained by sticking a needle into the belly (abdomen). This is done to check the chromosomes in instances where there is a concern about possible genetic problems with the baby. It is also sometimes done near the end of pregnancy if an early delivery is required. In this case, it is done to help make sure the baby's lungs are mature enough for the baby to live outside of the womb. CHANGES OCCURING IN THE SECOND TRIMESTER OF PREGNANCY Your body goes through many changes during pregnancy. They vary from person to person. Talk to your caregiver about changes you notice that you are concerned about.  During the second trimester, you will likely have an increase in your appetite. It is normal to have cravings for certain foods. This varies from person to person and pregnancy to pregnancy.  Your lower abdomen will begin to bulge.  You may have to urinate more often because the uterus and baby are pressing on your bladder. It is also common to get more bladder infections during pregnancy (pain with urination). You can help this by   drinking lots of fluids and emptying your bladder before and after intercourse.  You may begin to get stretch marks on your hips, abdomen, and breasts. These are normal changes in the body during pregnancy. There are no exercises or medications to take that prevent this change.  You may begin to develop swollen and bulging veins (varicose veins) in your legs. Wearing support hose, elevating your feet for 15 minutes, 3 to 4 times a day and limiting salt in your diet helps lessen the problem.  Heartburn may develop  as the uterus grows and pushes up against the stomach. Antacids recommended by your caregiver helps with this problem. Also, eating smaller meals 4 to 5 times a day helps.  Constipation can be treated with a stool softener or adding bulk to your diet. Drinking lots of fluids, vegetables, fruits, and whole grains are helpful.  Exercising is also helpful. If you have been very active up until your pregnancy, most of these activities can be continued during your pregnancy. If you have been less active, it is helpful to start an exercise program such as walking.  Hemorrhoids (varicose veins in the rectum) may develop at the end of the second trimester. Warm sitz baths and hemorrhoid cream recommended by your caregiver helps hemorrhoid problems.  Backaches may develop during this time of your pregnancy. Avoid heavy lifting, wear low heal shoes and practice good posture to help with backache problems.  Some pregnant women develop tingling and numbness of their hand and fingers because of swelling and tightening of ligaments in the wrist (carpel tunnel syndrome). This goes away after the baby is born.  As your breasts enlarge, you may have to get a bigger bra. Get a comfortable, cotton, support bra. Do not get a nursing bra until the last month of the pregnancy if you will be nursing the baby.  You may get a dark line from your belly button to the pubic area called the linea nigra.  You may develop rosy cheeks because of increase blood flow to the face.  You may develop spider looking lines of the face, neck, arms and chest. These go away after the baby is born. HOME CARE INSTRUCTIONS   It is extremely important to avoid all smoking, herbs, alcohol, and unprescribed drugs during your pregnancy. These chemicals affect the formation and growth of the baby. Avoid these chemicals throughout the pregnancy to ensure the delivery of a healthy infant.  Most of your home care instructions are the same as  suggested for the first trimester of your pregnancy. Keep your caregiver's appointments. Follow your caregiver's instructions regarding medication use, exercise and diet.  During pregnancy, you are providing food for you and your baby. Continue to eat regular, well-balanced meals. Choose foods such as meat, fish, milk and other low fat dairy products, vegetables, fruits, and whole-grain breads and cereals. Your caregiver will tell you of the ideal weight gain.  A physical sexual relationship may be continued up until near the end of pregnancy if there are no other problems. Problems could include early (premature) leaking of amniotic fluid from the membranes, vaginal bleeding, abdominal pain, or other medical or pregnancy problems.  Exercise regularly if there are no restrictions. Check with your caregiver if you are unsure of the safety of some of your exercises. The greatest weight gain will occur in the last 2 trimesters of pregnancy. Exercise will help you:  Control your weight.  Get you in shape for labor and delivery.  Lose weight   after you have the baby.  Wear a good support or jogging bra for breast tenderness during pregnancy. This may help if worn during sleep. Pads or tissues may be used in the bra if you are leaking colostrum.  Do not use hot tubs, steam rooms or saunas throughout the pregnancy.  Wear your seat belt at all times when driving. This protects you and your baby if you are in an accident.  Avoid raw meat, uncooked cheese, cat litter boxes and soil used by cats. These carry germs that can cause birth defects in the baby.  The second trimester is also a good time to visit your dentist for your dental health if this has not been done yet. Getting your teeth cleaned is OK. Use a soft toothbrush. Brush gently during pregnancy.  It is easier to loose urine during pregnancy. Tightening up and strengthening the pelvic muscles will help with this problem. Practice stopping your  urination while you are going to the bathroom. These are the same muscles you need to strengthen. It is also the muscles you would use as if you were trying to stop from passing gas. You can practice tightening these muscles up 10 times a set and repeating this about 3 times per day. Once you know what muscles to tighten up, do not perform these exercises during urination. It is more likely to contribute to an infection by backing up the urine.  Ask for help if you have financial, counseling or nutritional needs during pregnancy. Your caregiver will be able to offer counseling for these needs as well as refer you for other special needs.  Your skin may become oily. If so, wash your face with mild soap, use non-greasy moisturizer and oil or cream based makeup. MEDICATIONS AND DRUG USE IN PREGNANCY  Take prenatal vitamins as directed. The vitamin should contain 1 milligram of folic acid. Keep all vitamins out of reach of children. Only a couple vitamins or tablets containing iron may be fatal to a baby or young child when ingested.  Avoid use of all medications, including herbs, over-the-counter medications, not prescribed or suggested by your caregiver. Only take over-the-counter or prescription medicines for pain, discomfort, or fever as directed by your caregiver. Do not use aspirin.  Let your caregiver also know about herbs you may be using.  Alcohol is related to a number of birth defects. This includes fetal alcohol syndrome. All alcohol, in any form, should be avoided completely. Smoking will cause low birth rate and premature babies.  Street or illegal drugs are very harmful to the baby. They are absolutely forbidden. A baby born to an addicted mother will be addicted at birth. The baby will go through the same withdrawal an adult does. SEEK MEDICAL CARE IF:  You have any concerns or worries during your pregnancy. It is better to call with your questions if you feel they cannot wait, rather  than worry about them. SEEK IMMEDIATE MEDICAL CARE IF:   An unexplained oral temperature above 102 F (38.9 C) develops, or as your caregiver suggests.  You have leaking of fluid from the vagina (birth canal). If leaking membranes are suspected, take your temperature and tell your caregiver of this when you call.  There is vaginal spotting, bleeding, or passing clots. Tell your caregiver of the amount and how many pads are used. Light spotting in pregnancy is common, especially following intercourse.  You develop a bad smelling vaginal discharge with a change in the color from clear   to white.  You continue to feel sick to your stomach (nauseated) and have no relief from remedies suggested. You vomit blood or coffee ground-like materials.  You lose more than 2 pounds of weight or gain more than 2 pounds of weight over 1 week, or as suggested by your caregiver.  You notice swelling of your face, hands, feet, or legs.  You get exposed to German measles and have never had them.  You are exposed to fifth disease or chickenpox.  You develop belly (abdominal) pain. Round ligament discomfort is a common non-cancerous (benign) cause of abdominal pain in pregnancy. Your caregiver still must evaluate you.  You develop a bad headache that does not go away.  You develop fever, diarrhea, pain with urination, or shortness of breath.  You develop visual problems, blurry, or double vision.  You fall or are in a car accident or any kind of trauma.  There is mental or physical violence at home. Document Released: 04/27/2001 Document Revised: 07/26/2011 Document Reviewed: 10/30/2008 [employer] Patient Information 2013 [employer].  

## 2012-08-07 ENCOUNTER — Ambulatory Visit (INDEPENDENT_AMBULATORY_CARE_PROVIDER_SITE_OTHER): Payer: Medicaid Other

## 2012-08-07 ENCOUNTER — Ambulatory Visit (INDEPENDENT_AMBULATORY_CARE_PROVIDER_SITE_OTHER): Payer: Medicaid Other | Admitting: Obstetrics & Gynecology

## 2012-08-07 VITALS — BP 150/92 | Wt 246.0 lb

## 2012-08-07 DIAGNOSIS — I10 Essential (primary) hypertension: Secondary | ICD-10-CM

## 2012-08-07 DIAGNOSIS — Z331 Pregnant state, incidental: Secondary | ICD-10-CM

## 2012-08-07 DIAGNOSIS — Z1389 Encounter for screening for other disorder: Secondary | ICD-10-CM

## 2012-08-07 DIAGNOSIS — O09529 Supervision of elderly multigravida, unspecified trimester: Secondary | ICD-10-CM

## 2012-08-07 DIAGNOSIS — O3660X0 Maternal care for excessive fetal growth, unspecified trimester, not applicable or unspecified: Secondary | ICD-10-CM

## 2012-08-07 DIAGNOSIS — O3662X1 Maternal care for excessive fetal growth, second trimester, fetus 1: Secondary | ICD-10-CM

## 2012-08-07 DIAGNOSIS — O98519 Other viral diseases complicating pregnancy, unspecified trimester: Secondary | ICD-10-CM

## 2012-08-07 DIAGNOSIS — O09522 Supervision of elderly multigravida, second trimester: Secondary | ICD-10-CM

## 2012-08-07 DIAGNOSIS — O99340 Other mental disorders complicating pregnancy, unspecified trimester: Secondary | ICD-10-CM

## 2012-08-07 DIAGNOSIS — O3412 Maternal care for benign tumor of corpus uteri, second trimester: Secondary | ICD-10-CM

## 2012-08-07 DIAGNOSIS — O24912 Unspecified diabetes mellitus in pregnancy, second trimester: Secondary | ICD-10-CM

## 2012-08-07 DIAGNOSIS — O10012 Pre-existing essential hypertension complicating pregnancy, second trimester: Secondary | ICD-10-CM

## 2012-08-07 DIAGNOSIS — O24913 Unspecified diabetes mellitus in pregnancy, third trimester: Secondary | ICD-10-CM

## 2012-08-07 DIAGNOSIS — O3410 Maternal care for benign tumor of corpus uteri, unspecified trimester: Secondary | ICD-10-CM

## 2012-08-07 DIAGNOSIS — O99810 Abnormal glucose complicating pregnancy: Secondary | ICD-10-CM

## 2012-08-07 DIAGNOSIS — O10019 Pre-existing essential hypertension complicating pregnancy, unspecified trimester: Secondary | ICD-10-CM

## 2012-08-07 DIAGNOSIS — Z3490 Encounter for supervision of normal pregnancy, unspecified, unspecified trimester: Secondary | ICD-10-CM

## 2012-08-07 DIAGNOSIS — O360990 Maternal care for other rhesus isoimmunization, unspecified trimester, not applicable or unspecified: Secondary | ICD-10-CM

## 2012-08-07 LAB — POCT URINALYSIS DIPSTICK
Glucose, UA: NEGATIVE
Ketones, UA: NEGATIVE
Leukocytes, UA: NEGATIVE
Protein, UA: NEGATIVE

## 2012-08-07 MED ORDER — RHO D IMMUNE GLOBULIN 1500 UNIT/2ML IJ SOLN
300.0000 ug | Freq: Once | INTRAMUSCULAR | Status: AC
  Administered 2012-08-07: 300 ug via INTRAMUSCULAR

## 2012-08-07 NOTE — Progress Notes (Signed)
Patient took her BP meds a bit late.  BP better on second evaluation. Patient reports good fetal movement, denies any bleeding and no rupture of membranes symptoms or contraction.  Sonogram report normal see under imaging.  Follow up 2 weeks prenatal appointment.

## 2012-08-07 NOTE — Patient Instructions (Signed)

## 2012-08-07 NOTE — Progress Notes (Signed)
U/S(28+5) wks-active breech female fetus, approp. Growth EFW 2 lb 9 oz (1162gms), ant gr 1 plac, fluid wnl, fibroids #1=7.5cm, #2=2.8cm, #3=1.9cm, #4=1.9cm, (fibroids increased in size & # since 06/05/2012)

## 2012-08-08 LAB — US OB FOLLOW UP

## 2012-08-21 ENCOUNTER — Ambulatory Visit (INDEPENDENT_AMBULATORY_CARE_PROVIDER_SITE_OTHER): Payer: Medicaid Other | Admitting: Obstetrics & Gynecology

## 2012-08-21 ENCOUNTER — Encounter: Payer: Self-pay | Admitting: Obstetrics & Gynecology

## 2012-08-21 VITALS — BP 140/90 | Wt 246.0 lb

## 2012-08-21 DIAGNOSIS — Z331 Pregnant state, incidental: Secondary | ICD-10-CM

## 2012-08-21 DIAGNOSIS — Z3482 Encounter for supervision of other normal pregnancy, second trimester: Secondary | ICD-10-CM

## 2012-08-21 DIAGNOSIS — Z1389 Encounter for screening for other disorder: Secondary | ICD-10-CM

## 2012-08-21 DIAGNOSIS — O09529 Supervision of elderly multigravida, unspecified trimester: Secondary | ICD-10-CM

## 2012-08-21 DIAGNOSIS — O360990 Maternal care for other rhesus isoimmunization, unspecified trimester, not applicable or unspecified: Secondary | ICD-10-CM

## 2012-08-21 DIAGNOSIS — O3410 Maternal care for benign tumor of corpus uteri, unspecified trimester: Secondary | ICD-10-CM

## 2012-08-21 DIAGNOSIS — I10 Essential (primary) hypertension: Secondary | ICD-10-CM

## 2012-08-21 DIAGNOSIS — O24919 Unspecified diabetes mellitus in pregnancy, unspecified trimester: Secondary | ICD-10-CM

## 2012-08-21 DIAGNOSIS — O24913 Unspecified diabetes mellitus in pregnancy, third trimester: Secondary | ICD-10-CM

## 2012-08-21 DIAGNOSIS — O10019 Pre-existing essential hypertension complicating pregnancy, unspecified trimester: Secondary | ICD-10-CM

## 2012-08-21 DIAGNOSIS — O10013 Pre-existing essential hypertension complicating pregnancy, third trimester: Secondary | ICD-10-CM

## 2012-08-21 DIAGNOSIS — O99340 Other mental disorders complicating pregnancy, unspecified trimester: Secondary | ICD-10-CM

## 2012-08-21 LAB — POCT URINALYSIS DIPSTICK
Leukocytes, UA: NEGATIVE
Nitrite, UA: NEGATIVE

## 2012-08-21 NOTE — Patient Instructions (Signed)

## 2012-08-21 NOTE — Progress Notes (Signed)
BP weight and urine results all reviewed and noted. Patient reports good fetal movement, denies any bleeding and no rupture of membranes symptoms or regular contractions. Patient is without complaints. All questions were answered. Sonogram scheduled for Thursday to montior the pregnancy due to her chtn. Blood sugars are good

## 2012-08-28 ENCOUNTER — Ambulatory Visit: Payer: Medicaid Other | Admitting: Cardiology

## 2012-08-29 ENCOUNTER — Telehealth: Payer: Self-pay | Admitting: Obstetrics & Gynecology

## 2012-08-29 NOTE — Telephone Encounter (Signed)
Spoke with pt. Legs and feet swelling x3days. Advised that is common with warmer temps but to push fluids, keep feet and legs elevated as much as possible and don't add any salt to diet. Has a scheduled appt on 09/05/12. Advised if swelling is not getting any better, call us back before next appt. Pt says she is having no pain. Pt voiced understanding.

## 2012-09-04 ENCOUNTER — Other Ambulatory Visit: Payer: Medicaid Other

## 2012-09-05 ENCOUNTER — Other Ambulatory Visit: Payer: Self-pay | Admitting: Obstetrics & Gynecology

## 2012-09-05 ENCOUNTER — Ambulatory Visit (INDEPENDENT_AMBULATORY_CARE_PROVIDER_SITE_OTHER): Payer: Medicaid Other

## 2012-09-05 ENCOUNTER — Encounter: Payer: Self-pay | Admitting: Obstetrics & Gynecology

## 2012-09-05 ENCOUNTER — Ambulatory Visit (INDEPENDENT_AMBULATORY_CARE_PROVIDER_SITE_OTHER): Payer: Medicaid Other | Admitting: Obstetrics & Gynecology

## 2012-09-05 ENCOUNTER — Encounter: Payer: Medicaid Other | Admitting: Obstetrics & Gynecology

## 2012-09-05 VITALS — BP 150/90 | Wt 250.0 lb

## 2012-09-05 DIAGNOSIS — O3410 Maternal care for benign tumor of corpus uteri, unspecified trimester: Secondary | ICD-10-CM

## 2012-09-05 DIAGNOSIS — O09529 Supervision of elderly multigravida, unspecified trimester: Secondary | ICD-10-CM

## 2012-09-05 DIAGNOSIS — O3413 Maternal care for benign tumor of corpus uteri, third trimester: Secondary | ICD-10-CM

## 2012-09-05 DIAGNOSIS — I10 Essential (primary) hypertension: Secondary | ICD-10-CM

## 2012-09-05 DIAGNOSIS — O24913 Unspecified diabetes mellitus in pregnancy, third trimester: Secondary | ICD-10-CM

## 2012-09-05 DIAGNOSIS — Z331 Pregnant state, incidental: Secondary | ICD-10-CM

## 2012-09-05 DIAGNOSIS — O10013 Pre-existing essential hypertension complicating pregnancy, third trimester: Secondary | ICD-10-CM

## 2012-09-05 DIAGNOSIS — O10019 Pre-existing essential hypertension complicating pregnancy, unspecified trimester: Secondary | ICD-10-CM

## 2012-09-05 DIAGNOSIS — O09523 Supervision of elderly multigravida, third trimester: Secondary | ICD-10-CM

## 2012-09-05 DIAGNOSIS — O24919 Unspecified diabetes mellitus in pregnancy, unspecified trimester: Secondary | ICD-10-CM

## 2012-09-05 DIAGNOSIS — Z1389 Encounter for screening for other disorder: Secondary | ICD-10-CM

## 2012-09-05 DIAGNOSIS — O0990 Supervision of high risk pregnancy, unspecified, unspecified trimester: Secondary | ICD-10-CM

## 2012-09-05 LAB — POCT URINALYSIS DIPSTICK
Blood, UA: NEGATIVE
Glucose, UA: NEGATIVE
Nitrite, UA: NEGATIVE

## 2012-09-05 NOTE — Progress Notes (Signed)
U/S (32+6wks)-Breech active fetus, approp interval growth EFW 4lb 5 oz (1943 gms 36th%tile), fluid wnl AFI=10.6cm, ant gr 2 plac, largest fibroid stable in size since prev. U/s (7.5cm), slight increase in size in smaller fibroids

## 2012-09-05 NOTE — Progress Notes (Signed)
BP weight and urine results all reviewed and noted. Patient reports good fetal movement, denies any bleeding and no rupture of membranes symptoms or regular contractions. Patient is without complaints. All questions were answered.  

## 2012-09-08 ENCOUNTER — Other Ambulatory Visit: Payer: Medicaid Other

## 2012-09-08 ENCOUNTER — Ambulatory Visit (INDEPENDENT_AMBULATORY_CARE_PROVIDER_SITE_OTHER): Payer: Medicaid Other | Admitting: Obstetrics & Gynecology

## 2012-09-08 VITALS — BP 148/98 | Wt 249.0 lb

## 2012-09-08 DIAGNOSIS — O09529 Supervision of elderly multigravida, unspecified trimester: Secondary | ICD-10-CM

## 2012-09-08 DIAGNOSIS — O10019 Pre-existing essential hypertension complicating pregnancy, unspecified trimester: Secondary | ICD-10-CM

## 2012-09-08 DIAGNOSIS — O24913 Unspecified diabetes mellitus in pregnancy, third trimester: Secondary | ICD-10-CM

## 2012-09-08 DIAGNOSIS — O24919 Unspecified diabetes mellitus in pregnancy, unspecified trimester: Secondary | ICD-10-CM

## 2012-09-08 DIAGNOSIS — O0990 Supervision of high risk pregnancy, unspecified, unspecified trimester: Secondary | ICD-10-CM

## 2012-09-08 DIAGNOSIS — Z331 Pregnant state, incidental: Secondary | ICD-10-CM

## 2012-09-08 DIAGNOSIS — Z1389 Encounter for screening for other disorder: Secondary | ICD-10-CM

## 2012-09-08 DIAGNOSIS — O10013 Pre-existing essential hypertension complicating pregnancy, third trimester: Secondary | ICD-10-CM

## 2012-09-08 LAB — POCT URINALYSIS DIPSTICK
Ketones, UA: NEGATIVE
Leukocytes, UA: NEGATIVE

## 2012-09-08 MED ORDER — OMEPRAZOLE 20 MG PO CPDR: 20.0000 mg | DELAYED_RELEASE_CAPSULE | Freq: Every day | ORAL | Status: DC

## 2012-09-08 NOTE — Progress Notes (Signed)
BP weight and urine results all reviewed and noted. Patient reports good fetal movement, denies any bleeding and no rupture of membranes symptoms or regular contractions. Patient is without complaints. All questions were answered.  

## 2012-09-08 NOTE — Progress Notes (Signed)
Rechecked B/P 142/96

## 2012-09-08 NOTE — Patient Instructions (Addendum)
How a Baby Grows During Pregnancy Pregnancy begins when the female's sperm enters the female's egg. This happens in the fallopian tube and is called fertilization. The fertilized egg is called an embryo until it reaches 9 weeks from the time of fertilization. From 9 weeks until birth it is called a fetus. The fertilized egg moves down the tube into the uterus and attaches to the inside lining of the uterus.  The pregnant woman is responsible for the growth of the embryo/fetus by supplying nourishment and oxygen through the blood stream and placenta to the developing fetus. The uterus becomes larger and pops out from the abdomen more and more as the fetus develops and grows. A normal pregnancy lasts 280 days, with a range of 259 to 294 days, or 40 weeks. The pregnancy is divided up into three trimesters:  First trimester - 0 to 13 weeks.  Second trimester - 14 to 27 weeks.  Third trimester - 28 to 40 weeks. The day your baby is supposed to be born is called estimated date of confinement (EDC) or estimated date of delivery (EDD). GROWTH OF THE BABY MONTH BY MONTH 1. First Month: The fertilized egg attaches to the inside of the uterus and certain cells will form the placenta and others will develop into the fetus. The arms, legs, brain, spinal cord, lungs, and heart begin to develop. At the end of the first month the heart begins to beat. The embryo weighs less than an ounce and is  inch long. 2. Second Month: The bones can be seen, the inner ear, eye lids, hands and feet form and genitals develop. By the end of 8 weeks, all of the major organs are developing. The fetus now weighs less than an ounce and is one inch (2.54 cm) long. 3. Third Month: Teeth buds appear, all the internal organs are forming, bones and muscles begin to grow, the spine can flex and the skin is transparent. Finger and toe nails begin to form, the hands develop faster than the feet and the arms are longer than the legs at this point.  The fetus weighs a little more than an ounce (0.03 kg) and is 3 inches (8.89cm) long. 4. Fourth Month: The placenta is completely formed. The external sex organs, neck, outer ear, eyebrows, eyelids and fingernails are formed. The fetus can hear, swallow, flex its arms and legs and the kidney begins to produce urine. The skin is covered with a white waxy coating (vernix) and very thin hair (lanugo) is present. The fetus weighs 5 ounces (0.14kg) and is 6 to 7 inches (16.51cm) long. 5. Fifth Month: The fetus moves around more and can be felt for the first time (called quickening), sleeps and wakes up at times, may begin to suck its finger and the nails grow to the end of the fingers. The gallbladder is now functioning and helps to digest the nutrients, eggs are formed in the female and the testicles begin to drop down from the abdomen to the scrotum in the female. The fetus weighs  to 1 pound (0.45kg) and is 10 inches (25.4cm) long. 6. Sixth Month: The lungs are formed but the fetus does not breath yet. The eyes open, the brain develops more quickly at this time, one can detect finger and toe prints and thicker hair grows. The fetus weighs 1 to 1 pounds (0.68kg) and is 12 inches (30.48cm) long. 7. Seventh Month: The fetus can hear and respond to sounds, kicks and stretches and can sense   changes in light. The fetus weighs 2 to 2 pounds (1.13kg) and is 14 inches (35.56cm) long. 8. Eight Month: All organs and body systems are fully developed and functioning. The bones get harder, taste buds develop and can taste sweet and sour flavors and the fetus may hiccup now. Different parts of the brain are developing and the skull remains soft for the brain to grow. The fetus weighs 5 pounds (2.27kg) and is 18 inches (45.75cm) long. 9. Ninth Month: The fetus gains about a half a pound a week, the lungs are fully developed, patterns of sleep develop and the head moves down into the bottom of the uterus called vertex. If the  buttocks moves into the bottom of the uterus, it is called a breech. The fetus weighs 6 to 9 pounds (2.72 to 4.08kg) and is 20 inches (50.8cm) long. You should be informed about your pregnancy, yourself and how the baby is developing as much as possible. Being informed helps you to enjoy this experience. It also gives you the sense to feel if something is not going right and when to ask questions. Talk to your caregiver when you have questions about your baby or your own body. Document Released: 10/20/2007 Document Revised: 07/26/2011 Document Reviewed: 10/20/2007 [employer] Patient Information 2013 [employer].  

## 2012-09-10 LAB — US OB FOLLOW UP

## 2012-09-12 ENCOUNTER — Other Ambulatory Visit: Payer: Self-pay | Admitting: Obstetrics & Gynecology

## 2012-09-12 ENCOUNTER — Ambulatory Visit (INDEPENDENT_AMBULATORY_CARE_PROVIDER_SITE_OTHER): Payer: Medicaid Other | Admitting: Advanced Practice Midwife

## 2012-09-12 ENCOUNTER — Encounter: Payer: Self-pay | Admitting: Advanced Practice Midwife

## 2012-09-12 ENCOUNTER — Ambulatory Visit (INDEPENDENT_AMBULATORY_CARE_PROVIDER_SITE_OTHER): Payer: Medicaid Other

## 2012-09-12 VITALS — BP 152/102

## 2012-09-12 DIAGNOSIS — O10013 Pre-existing essential hypertension complicating pregnancy, third trimester: Secondary | ICD-10-CM

## 2012-09-12 DIAGNOSIS — O0990 Supervision of high risk pregnancy, unspecified, unspecified trimester: Secondary | ICD-10-CM

## 2012-09-12 DIAGNOSIS — O09899 Supervision of other high risk pregnancies, unspecified trimester: Secondary | ICD-10-CM

## 2012-09-12 DIAGNOSIS — O10019 Pre-existing essential hypertension complicating pregnancy, unspecified trimester: Secondary | ICD-10-CM

## 2012-09-12 DIAGNOSIS — I10 Essential (primary) hypertension: Secondary | ICD-10-CM

## 2012-09-12 DIAGNOSIS — O24913 Unspecified diabetes mellitus in pregnancy, third trimester: Secondary | ICD-10-CM

## 2012-09-12 DIAGNOSIS — Z3490 Encounter for supervision of normal pregnancy, unspecified, unspecified trimester: Secondary | ICD-10-CM

## 2012-09-12 DIAGNOSIS — O3410 Maternal care for benign tumor of corpus uteri, unspecified trimester: Secondary | ICD-10-CM

## 2012-09-12 DIAGNOSIS — O24919 Unspecified diabetes mellitus in pregnancy, unspecified trimester: Secondary | ICD-10-CM

## 2012-09-12 DIAGNOSIS — O09523 Supervision of elderly multigravida, third trimester: Secondary | ICD-10-CM

## 2012-09-12 DIAGNOSIS — O3413 Maternal care for benign tumor of corpus uteri, third trimester: Secondary | ICD-10-CM

## 2012-09-12 DIAGNOSIS — O09529 Supervision of elderly multigravida, unspecified trimester: Secondary | ICD-10-CM

## 2012-09-12 NOTE — Progress Notes (Signed)
Pt was late and could not stay for whole OBV. Denies problems. Will increase Labetalol to 300mg  BID (from 200BID); f/u [facility] as scheduled for NST/OBV

## 2012-09-12 NOTE — Assessment & Plan Note (Signed)
Clinic:[facility]  Genetic Screen NT:                                        Quad Screen/MSAFP:  Anatomic US   Glucose Screen   GBS   Feeding Preference   Contraception   Circumcision     

## 2012-09-12 NOTE — Progress Notes (Signed)
U/S (33+6wks)- vtx active fetus, BPP 8/8, fluid wnl AFI=7.7cm., ant gr 2 plac, UA Doppler RI=.63 & .64

## 2012-09-15 ENCOUNTER — Encounter: Payer: Self-pay | Admitting: Obstetrics & Gynecology

## 2012-09-15 ENCOUNTER — Ambulatory Visit (INDEPENDENT_AMBULATORY_CARE_PROVIDER_SITE_OTHER): Payer: Medicaid Other | Admitting: Obstetrics & Gynecology

## 2012-09-15 VITALS — BP 160/90 | Wt 250.5 lb

## 2012-09-15 DIAGNOSIS — O10019 Pre-existing essential hypertension complicating pregnancy, unspecified trimester: Secondary | ICD-10-CM

## 2012-09-15 DIAGNOSIS — O09529 Supervision of elderly multigravida, unspecified trimester: Secondary | ICD-10-CM

## 2012-09-15 DIAGNOSIS — O0993 Supervision of high risk pregnancy, unspecified, third trimester: Secondary | ICD-10-CM

## 2012-09-15 DIAGNOSIS — O3410 Maternal care for benign tumor of corpus uteri, unspecified trimester: Secondary | ICD-10-CM

## 2012-09-15 DIAGNOSIS — O10013 Pre-existing essential hypertension complicating pregnancy, third trimester: Secondary | ICD-10-CM

## 2012-09-15 DIAGNOSIS — Z331 Pregnant state, incidental: Secondary | ICD-10-CM

## 2012-09-15 DIAGNOSIS — Z1389 Encounter for screening for other disorder: Secondary | ICD-10-CM

## 2012-09-15 DIAGNOSIS — O24919 Unspecified diabetes mellitus in pregnancy, unspecified trimester: Secondary | ICD-10-CM

## 2012-09-15 LAB — POCT URINALYSIS DIPSTICK
Glucose, UA: NEGATIVE
Ketones, UA: NEGATIVE
Leukocytes, UA: NEGATIVE
Nitrite, UA: NEGATIVE

## 2012-09-15 NOTE — Progress Notes (Signed)
Cramping

## 2012-09-15 NOTE — Progress Notes (Signed)
Very active fetus, hard to trac but reactive BP weight and urine results all reviewed and noted. Patient reports good fetal movement, denies any bleeding and no rupture of membranes symptoms or regular contractions. Patient is without complaints. All questions were answered.

## 2012-09-18 ENCOUNTER — Other Ambulatory Visit: Payer: Self-pay | Admitting: Obstetrics & Gynecology

## 2012-09-18 DIAGNOSIS — O09523 Supervision of elderly multigravida, third trimester: Secondary | ICD-10-CM

## 2012-09-18 DIAGNOSIS — O10013 Pre-existing essential hypertension complicating pregnancy, third trimester: Secondary | ICD-10-CM

## 2012-09-19 ENCOUNTER — Ambulatory Visit (INDEPENDENT_AMBULATORY_CARE_PROVIDER_SITE_OTHER): Payer: Medicaid Other | Admitting: Advanced Practice Midwife

## 2012-09-19 ENCOUNTER — Other Ambulatory Visit: Payer: Self-pay | Admitting: Obstetrics & Gynecology

## 2012-09-19 ENCOUNTER — Ambulatory Visit (INDEPENDENT_AMBULATORY_CARE_PROVIDER_SITE_OTHER): Payer: Medicaid Other

## 2012-09-19 VITALS — BP 134/84 | Wt 249.0 lb

## 2012-09-19 DIAGNOSIS — O360990 Maternal care for other rhesus isoimmunization, unspecified trimester, not applicable or unspecified: Secondary | ICD-10-CM

## 2012-09-19 DIAGNOSIS — O10019 Pre-existing essential hypertension complicating pregnancy, unspecified trimester: Secondary | ICD-10-CM

## 2012-09-19 DIAGNOSIS — O98519 Other viral diseases complicating pregnancy, unspecified trimester: Secondary | ICD-10-CM

## 2012-09-19 DIAGNOSIS — Z1389 Encounter for screening for other disorder: Secondary | ICD-10-CM

## 2012-09-19 DIAGNOSIS — O10013 Pre-existing essential hypertension complicating pregnancy, third trimester: Secondary | ICD-10-CM

## 2012-09-19 DIAGNOSIS — Z331 Pregnant state, incidental: Secondary | ICD-10-CM

## 2012-09-19 DIAGNOSIS — I10 Essential (primary) hypertension: Secondary | ICD-10-CM

## 2012-09-19 DIAGNOSIS — O09523 Supervision of elderly multigravida, third trimester: Secondary | ICD-10-CM

## 2012-09-19 DIAGNOSIS — O09529 Supervision of elderly multigravida, unspecified trimester: Secondary | ICD-10-CM

## 2012-09-19 DIAGNOSIS — O0993 Supervision of high risk pregnancy, unspecified, third trimester: Secondary | ICD-10-CM

## 2012-09-19 DIAGNOSIS — O99810 Abnormal glucose complicating pregnancy: Secondary | ICD-10-CM

## 2012-09-19 DIAGNOSIS — O24913 Unspecified diabetes mellitus in pregnancy, third trimester: Secondary | ICD-10-CM

## 2012-09-19 DIAGNOSIS — O99340 Other mental disorders complicating pregnancy, unspecified trimester: Secondary | ICD-10-CM

## 2012-09-19 DIAGNOSIS — O3410 Maternal care for benign tumor of corpus uteri, unspecified trimester: Secondary | ICD-10-CM

## 2012-09-19 LAB — POCT URINALYSIS DIPSTICK
Blood, UA: NEGATIVE
Glucose, UA: NEGATIVE
Leukocytes, UA: NEGATIVE
Nitrite, UA: NEGATIVE

## 2012-09-19 NOTE — Progress Notes (Signed)
U/S-34+6wks vtx active fetus approp growth EFW 5'2" 29th%tile, fluid wnl AFI=11.2cm, BPP 8/8, UA Doppler RI-.59 & .58  Fibroid=7.3cm

## 2012-09-19 NOTE — Progress Notes (Signed)
C/o vaginal pressure and contractions

## 2012-09-19 NOTE — Progress Notes (Signed)
Hasn't checked bs in "a while" because it's "always good".  Checks in mornings.  States is "usually 99-113". Strongly encouraged to keep log for the next 2 days of QID testing and bring to Friday's visit.  See u/s from today

## 2012-09-20 ENCOUNTER — Other Ambulatory Visit: Payer: Self-pay | Admitting: Obstetrics & Gynecology

## 2012-09-20 ENCOUNTER — Telehealth: Payer: Self-pay | Admitting: Advanced Practice Midwife

## 2012-09-20 DIAGNOSIS — O09523 Supervision of elderly multigravida, third trimester: Secondary | ICD-10-CM

## 2012-09-20 DIAGNOSIS — O10013 Pre-existing essential hypertension complicating pregnancy, third trimester: Secondary | ICD-10-CM

## 2012-09-20 DIAGNOSIS — O3413 Maternal care for benign tumor of corpus uteri, third trimester: Secondary | ICD-10-CM

## 2012-09-20 MED ORDER — OMEPRAZOLE 20 MG PO CPDR: 20.0000 mg | DELAYED_RELEASE_CAPSULE | Freq: Every day | ORAL | Status: DC

## 2012-09-20 NOTE — Telephone Encounter (Signed)
Spoke with pt and let her know Prilosec had been sent to pharmacy. JSY

## 2012-09-20 NOTE — Telephone Encounter (Signed)
Drenda Freeze, I'm unsure what med she is talking about. Please review. Thanks, Marylu Lund

## 2012-09-22 ENCOUNTER — Ambulatory Visit (INDEPENDENT_AMBULATORY_CARE_PROVIDER_SITE_OTHER): Payer: Medicaid Other | Admitting: Obstetrics & Gynecology

## 2012-09-22 ENCOUNTER — Encounter: Payer: Self-pay | Admitting: Obstetrics & Gynecology

## 2012-09-22 ENCOUNTER — Other Ambulatory Visit: Payer: Medicaid Other

## 2012-09-22 VITALS — BP 140/100 | Wt 248.0 lb

## 2012-09-22 DIAGNOSIS — O360990 Maternal care for other rhesus isoimmunization, unspecified trimester, not applicable or unspecified: Secondary | ICD-10-CM

## 2012-09-22 DIAGNOSIS — O10019 Pre-existing essential hypertension complicating pregnancy, unspecified trimester: Secondary | ICD-10-CM

## 2012-09-22 DIAGNOSIS — O99340 Other mental disorders complicating pregnancy, unspecified trimester: Secondary | ICD-10-CM

## 2012-09-22 DIAGNOSIS — O10013 Pre-existing essential hypertension complicating pregnancy, third trimester: Secondary | ICD-10-CM

## 2012-09-22 DIAGNOSIS — O09529 Supervision of elderly multigravida, unspecified trimester: Secondary | ICD-10-CM

## 2012-09-22 DIAGNOSIS — Z331 Pregnant state, incidental: Secondary | ICD-10-CM

## 2012-09-22 DIAGNOSIS — O3410 Maternal care for benign tumor of corpus uteri, unspecified trimester: Secondary | ICD-10-CM

## 2012-09-22 DIAGNOSIS — O99810 Abnormal glucose complicating pregnancy: Secondary | ICD-10-CM

## 2012-09-22 DIAGNOSIS — Z1389 Encounter for screening for other disorder: Secondary | ICD-10-CM

## 2012-09-22 DIAGNOSIS — O98519 Other viral diseases complicating pregnancy, unspecified trimester: Secondary | ICD-10-CM

## 2012-09-22 DIAGNOSIS — O24913 Unspecified diabetes mellitus in pregnancy, third trimester: Secondary | ICD-10-CM

## 2012-09-22 LAB — POCT URINALYSIS DIPSTICK
Blood, UA: NEGATIVE
Glucose, UA: NEGATIVE
Ketones, UA: NEGATIVE
Nitrite, UA: NEGATIVE

## 2012-09-22 NOTE — Progress Notes (Signed)
BP weight and urine results all reviewed and noted. Patient reports good fetal movement, denies any bleeding and no rupture of membranes symptoms or regular contractions. Patient is without complaints. All questions were answered. Cont Labetalol at current dose. Blood sugars are doing good. Sonogram Tuesday, NST fridays

## 2012-09-25 LAB — US OB FOLLOW UP

## 2012-09-26 ENCOUNTER — Ambulatory Visit (INDEPENDENT_AMBULATORY_CARE_PROVIDER_SITE_OTHER): Payer: Medicaid Other

## 2012-09-26 ENCOUNTER — Encounter: Payer: Self-pay | Admitting: Obstetrics & Gynecology

## 2012-09-26 ENCOUNTER — Ambulatory Visit (INDEPENDENT_AMBULATORY_CARE_PROVIDER_SITE_OTHER): Payer: Medicaid Other | Admitting: Obstetrics & Gynecology

## 2012-09-26 VITALS — BP 130/90 | Wt 252.0 lb

## 2012-09-26 DIAGNOSIS — O10013 Pre-existing essential hypertension complicating pregnancy, third trimester: Secondary | ICD-10-CM

## 2012-09-26 DIAGNOSIS — I10 Essential (primary) hypertension: Secondary | ICD-10-CM

## 2012-09-26 DIAGNOSIS — O09529 Supervision of elderly multigravida, unspecified trimester: Secondary | ICD-10-CM

## 2012-09-26 DIAGNOSIS — O4100X0 Oligohydramnios, unspecified trimester, not applicable or unspecified: Secondary | ICD-10-CM

## 2012-09-26 DIAGNOSIS — O24913 Unspecified diabetes mellitus in pregnancy, third trimester: Secondary | ICD-10-CM

## 2012-09-26 DIAGNOSIS — O4103X1 Oligohydramnios, third trimester, fetus 1: Secondary | ICD-10-CM

## 2012-09-26 DIAGNOSIS — O10019 Pre-existing essential hypertension complicating pregnancy, unspecified trimester: Secondary | ICD-10-CM

## 2012-09-26 DIAGNOSIS — O09523 Supervision of elderly multigravida, third trimester: Secondary | ICD-10-CM

## 2012-09-26 DIAGNOSIS — O24919 Unspecified diabetes mellitus in pregnancy, unspecified trimester: Secondary | ICD-10-CM

## 2012-09-26 MED ORDER — GLYBURIDE 2.5 MG PO TABS: 2.5000 mg | ORAL_TABLET | Freq: Two times a day (BID) | ORAL | Status: DC

## 2012-09-26 NOTE — Progress Notes (Signed)
BP weight and urine results all reviewed and noted.  Pt could not void.  Add glyburide 2.5 BID for increasing fastings nd 2 hour post prandiol sugars Patient reports good fetal movement, denies any bleeding and no rupture of membranes symptoms or regular contractions. Patient is without complaints. All questions were answered. Sonogram noted.  Fluid back in previous level range. NST Friday

## 2012-09-26 NOTE — Progress Notes (Signed)
LESS FETAL MOVEMENT ON YESTERDAY.

## 2012-09-26 NOTE — Progress Notes (Signed)
U/S(35+6wks)-vtx active fetus, BPP 6/8, AFI-4.2cm, **(last week AFI-11+cm), without pocket of fluid >=2cm, FHR=159BPM

## 2012-09-29 ENCOUNTER — Encounter: Payer: Self-pay | Admitting: Obstetrics & Gynecology

## 2012-09-29 ENCOUNTER — Other Ambulatory Visit: Payer: Medicaid Other

## 2012-09-29 ENCOUNTER — Ambulatory Visit (INDEPENDENT_AMBULATORY_CARE_PROVIDER_SITE_OTHER): Payer: Medicaid Other | Admitting: Obstetrics & Gynecology

## 2012-09-29 VITALS — BP 170/80 | Wt 256.0 lb

## 2012-09-29 DIAGNOSIS — O99340 Other mental disorders complicating pregnancy, unspecified trimester: Secondary | ICD-10-CM

## 2012-09-29 DIAGNOSIS — O3410 Maternal care for benign tumor of corpus uteri, unspecified trimester: Secondary | ICD-10-CM

## 2012-09-29 DIAGNOSIS — O09529 Supervision of elderly multigravida, unspecified trimester: Secondary | ICD-10-CM

## 2012-09-29 DIAGNOSIS — O99810 Abnormal glucose complicating pregnancy: Secondary | ICD-10-CM | POA: Insufficient documentation

## 2012-09-29 DIAGNOSIS — O10013 Pre-existing essential hypertension complicating pregnancy, third trimester: Secondary | ICD-10-CM

## 2012-09-29 DIAGNOSIS — Z1389 Encounter for screening for other disorder: Secondary | ICD-10-CM

## 2012-09-29 DIAGNOSIS — O0993 Supervision of high risk pregnancy, unspecified, third trimester: Secondary | ICD-10-CM

## 2012-09-29 DIAGNOSIS — O98519 Other viral diseases complicating pregnancy, unspecified trimester: Secondary | ICD-10-CM

## 2012-09-29 DIAGNOSIS — O10019 Pre-existing essential hypertension complicating pregnancy, unspecified trimester: Secondary | ICD-10-CM

## 2012-09-29 DIAGNOSIS — O360990 Maternal care for other rhesus isoimmunization, unspecified trimester, not applicable or unspecified: Secondary | ICD-10-CM

## 2012-09-29 DIAGNOSIS — N390 Urinary tract infection, site not specified: Secondary | ICD-10-CM

## 2012-09-29 DIAGNOSIS — Z331 Pregnant state, incidental: Secondary | ICD-10-CM

## 2012-09-29 NOTE — Progress Notes (Signed)
Very active fetus on NST, difficult to trace BP weight and urine results all reviewed and noted. Patient reports good fetal movement, denies any bleeding and no rupture of membranes symptoms or regular contractions. Patient is without complaints. All questions were answered. Sonogram Tuesday to evaluate fluid, Dopplers and BPP Blood sugars OK today, pt has yet to get her glyburide.

## 2012-09-30 LAB — URINE CULTURE: Colony Count: 4000

## 2012-10-02 ENCOUNTER — Other Ambulatory Visit: Payer: Self-pay | Admitting: Obstetrics & Gynecology

## 2012-10-02 DIAGNOSIS — O3413 Maternal care for benign tumor of corpus uteri, third trimester: Secondary | ICD-10-CM

## 2012-10-02 DIAGNOSIS — O10013 Pre-existing essential hypertension complicating pregnancy, third trimester: Secondary | ICD-10-CM

## 2012-10-02 DIAGNOSIS — O24913 Unspecified diabetes mellitus in pregnancy, third trimester: Secondary | ICD-10-CM

## 2012-10-02 DIAGNOSIS — O09523 Supervision of elderly multigravida, third trimester: Secondary | ICD-10-CM

## 2012-10-03 ENCOUNTER — Encounter: Payer: Self-pay | Admitting: Advanced Practice Midwife

## 2012-10-03 ENCOUNTER — Ambulatory Visit (INDEPENDENT_AMBULATORY_CARE_PROVIDER_SITE_OTHER): Payer: Medicaid Other | Admitting: Advanced Practice Midwife

## 2012-10-03 ENCOUNTER — Ambulatory Visit (INDEPENDENT_AMBULATORY_CARE_PROVIDER_SITE_OTHER): Payer: Medicaid Other

## 2012-10-03 VITALS — BP 164/98 | Wt 255.0 lb

## 2012-10-03 DIAGNOSIS — O3410 Maternal care for benign tumor of corpus uteri, unspecified trimester: Secondary | ICD-10-CM

## 2012-10-03 DIAGNOSIS — Z3480 Encounter for supervision of other normal pregnancy, unspecified trimester: Secondary | ICD-10-CM

## 2012-10-03 DIAGNOSIS — O360990 Maternal care for other rhesus isoimmunization, unspecified trimester, not applicable or unspecified: Secondary | ICD-10-CM

## 2012-10-03 DIAGNOSIS — O24913 Unspecified diabetes mellitus in pregnancy, third trimester: Secondary | ICD-10-CM

## 2012-10-03 DIAGNOSIS — Z1389 Encounter for screening for other disorder: Secondary | ICD-10-CM

## 2012-10-03 DIAGNOSIS — O99810 Abnormal glucose complicating pregnancy: Secondary | ICD-10-CM

## 2012-10-03 DIAGNOSIS — O99340 Other mental disorders complicating pregnancy, unspecified trimester: Secondary | ICD-10-CM

## 2012-10-03 DIAGNOSIS — O09529 Supervision of elderly multigravida, unspecified trimester: Secondary | ICD-10-CM

## 2012-10-03 DIAGNOSIS — O98519 Other viral diseases complicating pregnancy, unspecified trimester: Secondary | ICD-10-CM

## 2012-10-03 DIAGNOSIS — O3413 Maternal care for benign tumor of corpus uteri, third trimester: Secondary | ICD-10-CM

## 2012-10-03 DIAGNOSIS — O10013 Pre-existing essential hypertension complicating pregnancy, third trimester: Secondary | ICD-10-CM

## 2012-10-03 DIAGNOSIS — O09523 Supervision of elderly multigravida, third trimester: Secondary | ICD-10-CM

## 2012-10-03 DIAGNOSIS — O10019 Pre-existing essential hypertension complicating pregnancy, unspecified trimester: Secondary | ICD-10-CM

## 2012-10-03 DIAGNOSIS — O24919 Unspecified diabetes mellitus in pregnancy, unspecified trimester: Secondary | ICD-10-CM

## 2012-10-03 DIAGNOSIS — Z331 Pregnant state, incidental: Secondary | ICD-10-CM

## 2012-10-03 LAB — POCT URINALYSIS DIPSTICK
Glucose, UA: NEGATIVE
Ketones, UA: NEGATIVE

## 2012-10-03 LAB — OB RESULTS CONSOLE GC/CHLAMYDIA
Chlamydia: NEGATIVE
Gonorrhea: NEGATIVE

## 2012-10-03 NOTE — Patient Instructions (Signed)
Increase Labetalol (blood pressure medicine) to 600mg  (3 tablets) twice a day.

## 2012-10-03 NOTE — Progress Notes (Signed)
Supposed to be on 600mg  Labetalol BID, but pt doesn't take it (it makes me feel drained too take 3 pills,so I usually take 2.  No need to prescribe me anything else because I'm not going to take it.)  Not taking Glyburide (I keep forgetting to pick it up). Still not checking BS regularly (FBS this am 103).  Pt informed that she should be taking her medicines as prescribed:  Her baby could wind up in the NICU d/t hypoglycemia, consequences from uncontrolled HTN, etc.  Still refuses to comply "Ive only got 2 more weeks").

## 2012-10-03 NOTE — Progress Notes (Signed)
U/S(36+6wks)-vtx,active fetus,BPP 8/8, UA Doppler RI-.68 &0.74, Fluid-wnl AFI=6.7cm (slight increase since 09/26/2012),anterior lower ut fibroid=7.5x5.8cm, EFW 6 lb 1 oz (2757gms 29th%tile)

## 2012-10-04 LAB — GC/CHLAMYDIA PROBE AMP: CT Probe RNA: NEGATIVE

## 2012-10-06 ENCOUNTER — Inpatient Hospital Stay (HOSPITAL_COMMUNITY)
Admission: AD | Admit: 2012-10-06 | Discharge: 2012-10-07 | Disposition: A | Discharge status: Home / Self Care | Payer: Medicaid Other | Source: Ambulatory Visit | Attending: Obstetrics and Gynecology | Admitting: Obstetrics and Gynecology

## 2012-10-06 ENCOUNTER — Encounter (HOSPITAL_COMMUNITY): Payer: Self-pay | Admitting: *Deleted

## 2012-10-06 ENCOUNTER — Ambulatory Visit (INDEPENDENT_AMBULATORY_CARE_PROVIDER_SITE_OTHER): Payer: Medicaid Other | Admitting: Obstetrics and Gynecology

## 2012-10-06 ENCOUNTER — Other Ambulatory Visit: Payer: Self-pay | Admitting: Obstetrics & Gynecology

## 2012-10-06 VITALS — BP 158/100 | Wt 254.6 lb

## 2012-10-06 DIAGNOSIS — O10013 Pre-existing essential hypertension complicating pregnancy, third trimester: Secondary | ICD-10-CM

## 2012-10-06 DIAGNOSIS — O24919 Unspecified diabetes mellitus in pregnancy, unspecified trimester: Secondary | ICD-10-CM

## 2012-10-06 DIAGNOSIS — I10 Essential (primary) hypertension: Secondary | ICD-10-CM

## 2012-10-06 DIAGNOSIS — O99891 Other specified diseases and conditions complicating pregnancy: Secondary | ICD-10-CM | POA: Insufficient documentation

## 2012-10-06 DIAGNOSIS — O10019 Pre-existing essential hypertension complicating pregnancy, unspecified trimester: Secondary | ICD-10-CM

## 2012-10-06 DIAGNOSIS — O09299 Supervision of pregnancy with other poor reproductive or obstetric history, unspecified trimester: Secondary | ICD-10-CM

## 2012-10-06 DIAGNOSIS — O09529 Supervision of elderly multigravida, unspecified trimester: Secondary | ICD-10-CM

## 2012-10-06 DIAGNOSIS — O10913 Unspecified pre-existing hypertension complicating pregnancy, third trimester: Secondary | ICD-10-CM

## 2012-10-06 DIAGNOSIS — O24913 Unspecified diabetes mellitus in pregnancy, third trimester: Secondary | ICD-10-CM

## 2012-10-06 DIAGNOSIS — R030 Elevated blood-pressure reading, without diagnosis of hypertension: Secondary | ICD-10-CM | POA: Insufficient documentation

## 2012-10-06 DIAGNOSIS — Z1389 Encounter for screening for other disorder: Secondary | ICD-10-CM

## 2012-10-06 DIAGNOSIS — Z331 Pregnant state, incidental: Secondary | ICD-10-CM

## 2012-10-06 DIAGNOSIS — R51 Headache: Secondary | ICD-10-CM | POA: Insufficient documentation

## 2012-10-06 DIAGNOSIS — O09523 Supervision of elderly multigravida, third trimester: Secondary | ICD-10-CM

## 2012-10-06 LAB — POCT URINALYSIS DIPSTICK
Ketones, UA: NEGATIVE
Leukocytes, UA: NEGATIVE
Protein, UA: NEGATIVE

## 2012-10-06 LAB — URINALYSIS, ROUTINE W REFLEX MICROSCOPIC
Glucose, UA: 100 mg/dL — AB
Ketones, ur: NEGATIVE mg/dL
Leukocytes, UA: NEGATIVE
Nitrite: NEGATIVE
Protein, ur: NEGATIVE mg/dL
pH: 6 (ref 5.0–8.0)

## 2012-10-06 LAB — CBC
HCT: 26.2 % — ABNORMAL LOW (ref 36.0–46.0)
MCHC: 33.2 g/dL (ref 30.0–36.0)
RDW: 14.6 % (ref 11.5–15.5)
WBC: 10.3 10*3/uL (ref 4.0–10.5)

## 2012-10-06 LAB — STREP B DNA PROBE: GBSP: POSITIVE

## 2012-10-06 MED ORDER — ONDANSETRON HCL 4 MG PO TABS
8.0000 mg | ORAL_TABLET | Freq: Once | ORAL | Status: AC
  Administered 2012-10-06: 8 mg via ORAL
  Filled 2012-10-06: qty 1

## 2012-10-06 NOTE — Progress Notes (Signed)
Bilateral swelling in feet, pain in left foot from swelling and tightness

## 2012-10-06 NOTE — MAU Note (Signed)
PT SAYS SHE WAS IN OFFICE TODAY-  WITH ELEVATED BP.  IS TAKING MED.    HAS  H/A NOW -  TYLENOL AT 630PM-  WITH NO RELIEF.  NO VE TODAY.   DENIES HSV AND MRSA.

## 2012-10-06 NOTE — Progress Notes (Signed)
Patient's incomplete cooperation with care continues, she will not increase her labetolol or consider tid regimen. Patient made aware that if she doesn't take her medicine, it speeds up the aging of the placenta, and we must consider delivery earlier than otherwise desired. Case discussed with Dr Despina Hidden. Will consider IOL next week each visit. NST reactive today. No h/a, no scotoma, has BPP Tues.  Did not bring CBG's Pt Does agree to go promptly to [facility] for any change in condition.

## 2012-10-06 NOTE — Patient Instructions (Signed)
[facility] to [facility] for any worsening of symptoms

## 2012-10-06 NOTE — MAU Provider Note (Signed)
History     CSN: [id number]  Arrival date and time: 10/06/12 2206   First Provider Initiated Contact with Patient 10/06/12 2254      Chief Complaint  Patient presents with  . Hypertension   HPI Chelsey Santiago is a 37 y.o. [phone] at [redacted]w[redacted]d who presents with elevated BP.  Per the medical record this has been an ongoing issue throughout her pregnancy.  She was seen at [facility] tree today and refuses increases in her BP medications as it makes her feel poorly.  This afternoon at about 1pm she develop severe headache (9/10 in severity).  She has also been "seeing stars."  In the setting of her high BP, she came to the MAU for evaluation.  She has no other complaints at this time, but does endorse lower extremity edema.  No SOB, chest pain, RUQ pain, loss of fluid, vaginal bleeding, or contractions.  Past Medical History  Diagnosis Date  . Hypertension     Lab: Normal BMet except glucose of 118 in 09/2010  . Hypertensive heart disease 2009    Pulmonary edema postpartum; mild to moderate mitral regurgitation when hospitalized for CHF in 2009; Echocardiogram in 12/2009-no MR and normal EF; normal CXR in 09/2010  . Migraine headache   . Anemia     H&H of 10.6/33 and 07/2008 and 11.9/35 and 09/2010  . Osteoarthritis, knee 03/29/2011  . Sleep apnea   . Depression with anxiety   . Fasting hyperglycemia   . Obesity 04/16/2009  . CHF (congestive heart failure)   . Pregnant   . Diabetes mellitus without complication   . Anxiety     Past Surgical History  Procedure Laterality Date  . Breast reduction surgery  2002    Family History  Problem Relation Age of Onset  . Diabetes Mother   . Heart disease Mother   . Hypertension Maternal Uncle   . Hyperlipidemia Paternal Grandfather   . Hypertension Paternal Grandfather   . Sudden death Neg Hx   . Heart attack Neg Hx     History  Substance Use Topics  . Smoking status: Never Smoker   . Smokeless tobacco: Never Used  . Alcohol Use: No      Comment: occ; not now    Allergies:  Allergies  Allergen Reactions  . Diclofenac     Edema  . Tramadol Nausea And Vomiting  . Vicodin (Hydrocodone-Acetaminophen) Nausea Only    Prescriptions prior to admission  Medication Sig Dispense Refill  . labetalol (NORMODYNE) 200 MG tablet Take 2 tablets (400 mg total) by mouth 2 (two) times daily.  120 tablet  6  . NIFEdipine (PROCARDIA XL/ADALAT-CC) 90 MG 24 hr tablet Take 90 mg by mouth daily.      Marland Kitchen omeprazole (PRILOSEC) 20 MG capsule Take 1 capsule (20 mg total) by mouth daily.  30 capsule  6  . venlafaxine XR (EFFEXOR-XR) 150 MG 24 hr capsule Take 150 mg by mouth daily.      Marland Kitchen glyBURIDE (DIABETA) 2.5 MG tablet Take 1 tablet (2.5 mg total) by mouth 2 (two) times daily with a meal.  60 tablet  3    ROS See HPI Physical Exam   Blood pressure 135/78, pulse 87, temperature 97.7 F (36.5 C), resp. rate 20, height 5\' 6"  (1.676 m), last menstrual period 01/19/2012, SpO2 100.00%.  Physical Exam Gen: well appearing, NAD. Heart: RRR. No m/r/g Lungs: CTAB. No rales, rhonchi, or wheezing. Abd: gravid but otherwise soft, nontender to palpation Ext:  2+ LE edema Neuro: No focal deficits   FHR: baseline 150, mod variability, 15x15 accels, no decels Toco: None    MAU Course  Procedures   Assessment and Plan  37 y.o. [id number] at [redacted]w[redacted]d who presents with elevated BP and headache. - BP improved from prior readings.  Last BP 135/78. - Will obtain PIH labs. - Encourage compliance/willingness to increase home BP medications as indicated by Dr. Emelda Fear.  Patient continues to refuse. - Will await lab studies, then will determine disposition.  0200: BP's controlled. Labs - Platelet count 208, AST/ALT 11/6, Urine Protein/Creatinine Ratio 0.08.   Will discharge home with continued close follow up at [facility].  [name] 10/06/2012, 11:01 PM   Seen also by me Agree with note Has appt Tues. States "the Doctor is going to induce me if  my BP is high Tuesday" Wynelle Bourgeois CNM

## 2012-10-06 NOTE — MAU Note (Signed)
Saw doctor today and B/P has been high. Told if I feel bad to come in. Have had headache all day and seeing little blotches

## 2012-10-07 ENCOUNTER — Other Ambulatory Visit: Payer: Self-pay | Admitting: Advanced Practice Midwife

## 2012-10-07 DIAGNOSIS — O1002 Pre-existing essential hypertension complicating childbirth: Secondary | ICD-10-CM

## 2012-10-07 LAB — COMPREHENSIVE METABOLIC PANEL
ALT: 6 U/L (ref 0–35)
AST: 11 U/L (ref 0–37)
Albumin: 2.5 g/dL — ABNORMAL LOW (ref 3.5–5.2)
CO2: 24 mEq/L (ref 19–32)
Calcium: 9.2 mg/dL (ref 8.4–10.5)
Chloride: 102 mEq/L (ref 96–112)
GFR calc non Af Amer: >90 mL/min (ref >90)
Sodium: 134 mEq/L — ABNORMAL LOW (ref 135–145)
Total Bilirubin: 0.2 mg/dL — ABNORMAL LOW (ref 0.3–1.2)

## 2012-10-07 LAB — PROTEIN / CREATININE RATIO, URINE
Protein Creatinine Ratio: 0.08 (ref 0.00–0.15)
Total Protein, Urine: 12.1 mg/dL

## 2012-10-08 ENCOUNTER — Encounter (HOSPITAL_COMMUNITY): Payer: Self-pay | Admitting: *Deleted

## 2012-10-08 ENCOUNTER — Inpatient Hospital Stay (HOSPITAL_COMMUNITY)
Admission: AD | Admit: 2012-10-08 | Discharge: 2012-10-12 | DRG: 774 | Disposition: A | Discharge status: Home / Self Care | Payer: Medicaid Other | Source: Ambulatory Visit | Attending: Obstetrics and Gynecology | Admitting: Obstetrics and Gynecology

## 2012-10-08 DIAGNOSIS — O1002 Pre-existing essential hypertension complicating childbirth: Secondary | ICD-10-CM

## 2012-10-08 DIAGNOSIS — O9902 Anemia complicating childbirth: Secondary | ICD-10-CM | POA: Diagnosis present

## 2012-10-08 DIAGNOSIS — O99892 Other specified diseases and conditions complicating childbirth: Secondary | ICD-10-CM | POA: Diagnosis present

## 2012-10-08 DIAGNOSIS — Z22330 Carrier of Group B streptococcus: Secondary | ICD-10-CM

## 2012-10-08 DIAGNOSIS — O99214 Obesity complicating childbirth: Secondary | ICD-10-CM | POA: Diagnosis present

## 2012-10-08 DIAGNOSIS — O2432 Unspecified pre-existing diabetes mellitus in childbirth: Secondary | ICD-10-CM | POA: Diagnosis present

## 2012-10-08 DIAGNOSIS — E119 Type 2 diabetes mellitus without complications: Secondary | ICD-10-CM | POA: Diagnosis present

## 2012-10-08 DIAGNOSIS — O09529 Supervision of elderly multigravida, unspecified trimester: Secondary | ICD-10-CM | POA: Diagnosis present

## 2012-10-08 DIAGNOSIS — D649 Anemia, unspecified: Secondary | ICD-10-CM | POA: Diagnosis present

## 2012-10-08 DIAGNOSIS — O1493 Unspecified pre-eclampsia, third trimester: Secondary | ICD-10-CM

## 2012-10-08 DIAGNOSIS — O1410 Severe pre-eclampsia, unspecified trimester: Secondary | ICD-10-CM

## 2012-10-08 DIAGNOSIS — I10 Essential (primary) hypertension: Secondary | ICD-10-CM

## 2012-10-08 DIAGNOSIS — E669 Obesity, unspecified: Secondary | ICD-10-CM | POA: Diagnosis present

## 2012-10-08 HISTORY — DX: Chronic pulmonary edema: J81.1

## 2012-10-08 HISTORY — DX: Cardiomegaly: I51.7

## 2012-10-08 HISTORY — DX: Unspecified pre-eclampsia, unspecified trimester: O14.90

## 2012-10-08 LAB — CBC
Platelets: 182 10*3/uL (ref 150–400)
RDW: 14.6 % (ref 11.5–15.5)
WBC: 9.3 10*3/uL (ref 4.0–10.5)

## 2012-10-08 LAB — URINALYSIS, ROUTINE W REFLEX MICROSCOPIC
Bilirubin Urine: NEGATIVE
Nitrite: NEGATIVE
Specific Gravity, Urine: >1.03 — ABNORMAL HIGH (ref 1.005–1.030)
Urobilinogen, UA: 1 mg/dL (ref 0.0–1.0)
pH: 5.5 (ref 5.0–8.0)

## 2012-10-08 LAB — COMPREHENSIVE METABOLIC PANEL
AST: 12 U/L (ref 0–37)
CO2: 21 mEq/L (ref 19–32)
Chloride: 104 mEq/L (ref 96–112)
Creatinine, Ser: 0.62 mg/dL (ref 0.50–1.10)
GFR calc non Af Amer: >90 mL/min (ref >90)
Total Bilirubin: 0.2 mg/dL — ABNORMAL LOW (ref 0.3–1.2)

## 2012-10-08 LAB — PROTEIN / CREATININE RATIO, URINE
Creatinine, Urine: 177.45 mg/dL
Protein Creatinine Ratio: 0.08 (ref 0.00–0.15)

## 2012-10-08 MED ORDER — MAGNESIUM SULFATE BOLUS VIA INFUSION
4.0000 g | Freq: Once | INTRAVENOUS | Status: AC
  Administered 2012-10-08: 4 g via INTRAVENOUS
  Filled 2012-10-08: qty 500

## 2012-10-08 MED ORDER — MAGNESIUM SULFATE 40 G IN LACTATED RINGERS - SIMPLE
2.0000 g/h | INTRAVENOUS | Status: DC
  Filled 2012-10-08 (×2): qty 500

## 2012-10-08 MED ORDER — MISOPROSTOL 25 MCG QUARTER TABLET
25.0000 ug | ORAL_TABLET | ORAL | Status: DC | PRN
  Administered 2012-10-08 – 2012-10-09 (×2): 25 ug via VAGINAL
  Filled 2012-10-08: qty 0.25
  Filled 2012-10-08: qty 1
  Filled 2012-10-08: qty 0.25

## 2012-10-08 MED ORDER — ACETAMINOPHEN 325 MG PO TABS
650.0000 mg | ORAL_TABLET | ORAL | Status: DC | PRN
  Administered 2012-10-08 – 2012-10-09 (×2): 650 mg via ORAL
  Filled 2012-10-08 (×2): qty 2

## 2012-10-08 MED ORDER — NIFEDIPINE ER OSMOTIC RELEASE 90 MG PO TB24
90.0000 mg | ORAL_TABLET | Freq: Every day | ORAL | Status: DC
  Administered 2012-10-08 – 2012-10-09 (×2): 90 mg via ORAL
  Filled 2012-10-08 (×3): qty 1

## 2012-10-08 MED ORDER — TERBUTALINE SULFATE 1 MG/ML IJ SOLN: 0.2500 mg | Freq: Once | INTRAMUSCULAR | Status: AC | PRN

## 2012-10-08 MED ORDER — PENICILLIN G POTASSIUM 5000000 UNITS IJ SOLR
5.0000 10*6.[IU] | Freq: Once | INTRAVENOUS | Status: AC
  Administered 2012-10-08: 5 10*6.[IU] via INTRAVENOUS
  Filled 2012-10-08: qty 5

## 2012-10-08 MED ORDER — OXYTOCIN 40 UNITS IN LACTATED RINGERS INFUSION - SIMPLE MED
62.5000 mL/h | INTRAVENOUS | Status: DC
  Filled 2012-10-08: qty 1000

## 2012-10-08 MED ORDER — INSULIN ASPART 100 UNIT/ML ~~LOC~~ SOLN
0.0000 [IU] | Freq: Three times a day (TID) | SUBCUTANEOUS | Status: DC
  Administered 2012-10-10: 2 [IU] via SUBCUTANEOUS

## 2012-10-08 MED ORDER — NALBUPHINE SYRINGE 5 MG/0.5 ML
10.0000 mg | INJECTION | INTRAMUSCULAR | Status: DC | PRN
  Filled 2012-10-08: qty 1

## 2012-10-08 MED ORDER — OXYCODONE-ACETAMINOPHEN 5-325 MG PO TABS
1.0000 | ORAL_TABLET | ORAL | Status: DC | PRN
  Administered 2012-10-09 (×2): 1 via ORAL
  Filled 2012-10-08 (×2): qty 1

## 2012-10-08 MED ORDER — CITRIC ACID-SODIUM CITRATE 334-500 MG/5ML PO SOLN: 30.0000 mL | ORAL | Status: DC | PRN

## 2012-10-08 MED ORDER — LABETALOL HCL 200 MG PO TABS
400.0000 mg | ORAL_TABLET | Freq: Two times a day (BID) | ORAL | Status: DC
  Administered 2012-10-08 – 2012-10-09 (×3): 400 mg via ORAL
  Filled 2012-10-08 (×4): qty 2

## 2012-10-08 MED ORDER — ONDANSETRON HCL 4 MG/2ML IJ SOLN
4.0000 mg | Freq: Four times a day (QID) | INTRAMUSCULAR | Status: DC | PRN
  Administered 2012-10-08 – 2012-10-10 (×3): 4 mg via INTRAVENOUS
  Filled 2012-10-08 (×3): qty 2

## 2012-10-08 MED ORDER — VENLAFAXINE HCL ER 150 MG PO CP24
150.0000 mg | ORAL_CAPSULE | Freq: Every day | ORAL | Status: DC
  Administered 2012-10-08 – 2012-10-12 (×3): 150 mg via ORAL
  Filled 2012-10-08 (×5): qty 1

## 2012-10-08 MED ORDER — LACTATED RINGERS IV SOLN
INTRAVENOUS | Status: DC
  Administered 2012-10-08 – 2012-10-09 (×2): via INTRAVENOUS

## 2012-10-08 MED ORDER — LACTATED RINGERS IV SOLN: 500.0000 mL | INTRAVENOUS | Status: DC | PRN

## 2012-10-08 MED ORDER — LABETALOL HCL 5 MG/ML IV SOLN
20.0000 mg | INTRAVENOUS | Status: DC | PRN
  Administered 2012-10-09: 20 mg via INTRAVENOUS
  Filled 2012-10-08: qty 4

## 2012-10-08 MED ORDER — PENICILLIN G POTASSIUM 5000000 UNITS IJ SOLR
2.5000 10*6.[IU] | INTRAVENOUS | Status: DC
  Administered 2012-10-09 – 2012-10-10 (×6): 2.5 10*6.[IU] via INTRAVENOUS
  Filled 2012-10-08 (×10): qty 2.5

## 2012-10-08 MED ORDER — LIDOCAINE HCL (PF) 1 % IJ SOLN
30.0000 mL | INTRAMUSCULAR | Status: DC | PRN
  Filled 2012-10-08 (×2): qty 30

## 2012-10-08 MED ORDER — OXYTOCIN BOLUS FROM INFUSION
500.0000 mL | INTRAVENOUS | Status: DC
  Administered 2012-10-10: 500 mL via INTRAVENOUS

## 2012-10-08 NOTE — H&P (Signed)
Chelsey Santiago is a 37 y.o. [phone] at [redacted]w[redacted]d with chronic HTN, class B diabetes, anemia, and depression who presents with elevated BP.   She reports that today she took her BP at [facility] and at her grandmother's house and it was consistently elevated (155-171/98-101). She reports associated headache and nausea. She also reports that she feels "drained."  Maternal Medical History:    OB History   Grav Para Term Preterm Abortions TAB SAB Ect Mult Living   9 4 4  4 2 2   4      Past Medical History  Diagnosis Date  . Hypertension     Lab: Normal BMet except glucose of 118 in 09/2010  . Hypertensive heart disease 2009    Pulmonary edema postpartum; mild to moderate mitral regurgitation when hospitalized for CHF in 2009; Echocardiogram in 12/2009-no MR and normal EF; normal CXR in 09/2010  . Migraine headache   . Anemia     H&H of 10.6/33 and 07/2008 and 11.9/35 and 09/2010  . Osteoarthritis, knee 03/29/2011  . Sleep apnea   . Depression with anxiety   . Fasting hyperglycemia   . Obesity 04/16/2009  . CHF (congestive heart failure)   . Pregnant   . Diabetes mellitus without complication   . Anxiety   . Enlarged heart    Past Surgical History  Procedure Laterality Date  . Breast reduction surgery  2002   Family History: family history includes Diabetes in her mother; Heart disease in her mother; Hyperlipidemia in her paternal grandfather; and Hypertension in her maternal uncle and paternal grandfather.  There is no history of Sudden death and Heart attack. Social History:  reports that she has never smoked. She has never used smokeless tobacco. She reports that she does not drink alcohol or use illicit drugs.   Prenatal Transfer Tool  Maternal Diabetes: Yes:  Diabetes Type:  Pre-pregnancy On glyburide. Genetic Screening: Normal. Maternal Ultrasounds/Referrals: Normal; However, [place] on 5/15 showed decrease Amniotic fluid volume. Fetal Ultrasounds or other Referrals:  None Maternal  Substance Abuse:  No Significant Maternal Medications:  Meds include: Glyburide, Labetalol, Nifedipine, Effexor Significant Maternal Lab Results:  Most recent Hb - 8.7  Review of Systems  Constitutional: Negative for fever and chills.  Eyes: Negative for blurred vision.  Respiratory: Negative for shortness of breath.   Cardiovascular: Positive for leg swelling. Negative for chest pain.  Gastrointestinal: Negative for vomiting and abdominal pain.  Neurological: Positive for headaches.     Blood pressure 161/85, pulse 85, temperature 97.6 F (36.4 C), temperature source Oral, resp. rate 18, height 5\' 6"  (1.676 m), weight 116.302 kg (256 lb 6.4 oz), last menstrual period 01/19/2012, SpO2 99.00%. Exam Physical Exam  Gen: well appearing, NAD. Heart: RRR. Lungs: CTAB. Abd: gravid but otherwise soft, nontender to palpation Ext: 2+ pitting LE edema Neuro: no focal deficits GU: normal appearing external genitalia  Dilation: Closed Effacement (%): Thick Presentation: Undeterminable Exam by:: L. Munford RN/ Adriana Simas MD  FHR: baseline 150, mod variability, 15x15 accels, no decels Toco: Irregular  Prenatal labs: ABO, Rh: O/Negative/-- (12/18 0000) Antibody: Negative (12/18 0000) Rubella: Immune (12/18 0000) RPR: Nonreactive (12/18 0000)  HBsAg: Negative (12/18 0000)  HIV: Non-reactive (12/18 0000)  GBS: POSITIVE (05/20 1055)   Assessment/Plan:  37 y.o. [id number] at [redacted]w[redacted]d with chronic HTN, class B diabetes, anemia, and depression who presents with elevated BP.   - Will admit to birthing suites for IOL - Repeating PIH labs (CBC, CMP, Urine Protein/Creatinine ratio) - Starting  Mag, SSI with Q2H CBG checks - Continuing home Procardia and Labetalol - GBS positive - starting PCN - Anticipate Vaginal Delivery  [name] 10/08/2012, 9:11 PM  I saw and examined patient and agree with above. Cytotec for cervical ripening. Anticipate SVD. I reviewed NST and it is reactive. Napoleon Form,  MD

## 2012-10-08 NOTE — MAU Note (Signed)
Vertex by b/s ultrasound per Dr. Thad Ranger

## 2012-10-08 NOTE — MAU Provider Note (Signed)
Attestation of Attending Supervision of Advanced Practitioner (CNM/NP): Evaluation and management procedures were performed by the Advanced Practitioner under my supervision and collaboration.  I have reviewed the Advanced Practitioner's note and chart, and I agree with the management and plan.  Maguadalupe Lata 10/08/2012 8:25 AM

## 2012-10-08 NOTE — MAU Note (Signed)
Blood pressure has been high all day. Patient states she has a slight nagging headache.

## 2012-10-08 NOTE — MAU Provider Note (Signed)
History     CSN: [id number]  Arrival date and time: 10/08/12 2006   First Provider Initiated Contact with Patient 10/08/12 2049      Chief Complaint  Patient presents with  . Hypertension   HPI Chelsey Santiago is a 37 y.o. [id number] at [redacted]w[redacted]d with Chronic HTN and DM who presents with elevated BP.  Per the medical record this has been an ongoing issue throughout her pregnancy.   She reports that today she took her BP at [facility] and at her grandmother's house and it was consistently elevated (155-171/98-101).  She reports associated headache and nausea.  She also reports that she feels "drained."  Past Medical History  Diagnosis Date  . Hypertension     Lab: Normal BMet except glucose of 118 in 09/2010  . Hypertensive heart disease 2009    Pulmonary edema postpartum; mild to moderate mitral regurgitation when hospitalized for CHF in 2009; Echocardiogram in 12/2009-no MR and normal EF; normal CXR in 09/2010  . Migraine headache   . Anemia     H&H of 10.6/33 and 07/2008 and 11.9/35 and 09/2010  . Osteoarthritis, knee 03/29/2011  . Sleep apnea   . Depression with anxiety   . Fasting hyperglycemia   . Obesity 04/16/2009  . CHF (congestive heart failure)   . Pregnant   . Diabetes mellitus without complication   . Anxiety   . Enlarged heart     Past Surgical History  Procedure Laterality Date  . Breast reduction surgery  2002    Family History  Problem Relation Age of Onset  . Diabetes Mother   . Heart disease Mother   . Hypertension Maternal Uncle   . Hyperlipidemia Paternal Grandfather   . Hypertension Paternal Grandfather   . Sudden death Neg Hx   . Heart attack Neg Hx     History  Substance Use Topics  . Smoking status: Never Smoker   . Smokeless tobacco: Never Used  . Alcohol Use: No     Comment: occ; not now    Allergies:  Allergies  Allergen Reactions  . Diclofenac     Edema  . Tramadol Nausea And Vomiting  . Vicodin (Hydrocodone-Acetaminophen) Nausea Only     Prescriptions prior to admission  Medication Sig Dispense Refill  . acetaminophen (TYLENOL) 500 MG tablet Take 1,000 mg by mouth every 6 (six) hours as needed for pain.      Marland Kitchen glyBURIDE (DIABETA) 2.5 MG tablet Take 1 tablet (2.5 mg total) by mouth 2 (two) times daily with a meal.  60 tablet  3  . labetalol (NORMODYNE) 200 MG tablet Take 2 tablets (400 mg total) by mouth 2 (two) times daily.  120 tablet  6  . NIFEdipine (PROCARDIA XL/ADALAT-CC) 90 MG 24 hr tablet Take 90 mg by mouth daily.      Marland Kitchen omeprazole (PRILOSEC) 20 MG capsule Take 1 capsule (20 mg total) by mouth daily.  30 capsule  6  . venlafaxine XR (EFFEXOR-XR) 150 MG 24 hr capsule Take 150 mg by mouth daily.        ROS See HPI Physical Exam   Blood pressure 161/85, pulse 86, temperature 97.6 F (36.4 C), temperature source Oral, resp. rate 18, height 5\' 6"  (1.676 m), weight 116.302 kg (256 lb 6.4 oz), last menstrual period 01/19/2012, SpO2 100.00%.  Physical Exam Gen: NAD Heart: RRR Lungs: CTAB. No rales, rhonchi, or wheezing. Abd: gravid but otherwise soft, nontender to palpation Ext: 2+ pitting LE edema Neuro: no focal  deficits. GU: normal appearing external genitalia Cervical exam: fingertip/Thick/Ballotable    MAU Course  Procedures  Assessment and Plan   38 y.o. [id number] at [redacted]w[redacted]d with Chronic HTN and class B DM who presents with elevated BP.  - Given severely elevated BP, will admit to birthing suites for induction of labor - Will start patient on Mag and SSI - Anticipate Vaginal delivery  [name] 10/08/2012, 8:49 PM   I saw and examined patient and agree with above resident note. I reviewed history, imaging, labs, and vitals. I personally reviewed the fetal heart tracing, and it is reactive. Napoleon Form, MD

## 2012-10-09 ENCOUNTER — Inpatient Hospital Stay (HOSPITAL_COMMUNITY): Payer: Medicaid Other | Admitting: Anesthesiology

## 2012-10-09 ENCOUNTER — Encounter (HOSPITAL_COMMUNITY): Payer: Self-pay | Admitting: Anesthesiology

## 2012-10-09 ENCOUNTER — Encounter (HOSPITAL_COMMUNITY): Payer: Self-pay | Admitting: *Deleted

## 2012-10-09 LAB — CBC
HCT: 32.8 % — ABNORMAL LOW (ref 36.0–46.0)
Hemoglobin: 10.6 g/dL — ABNORMAL LOW (ref 12.0–15.0)
MCH: 22.7 pg — ABNORMAL LOW (ref 26.0–34.0)
MCHC: 32.3 g/dL (ref 30.0–36.0)
MCV: 70.4 fL — ABNORMAL LOW (ref 78.0–100.0)

## 2012-10-09 LAB — GLUCOSE, CAPILLARY
Glucose-Capillary: 124 mg/dL — ABNORMAL HIGH (ref 70–99)
Glucose-Capillary: 108 mg/dL — ABNORMAL HIGH (ref 70–99)
Glucose-Capillary: 102 mg/dL — ABNORMAL HIGH (ref 70–99)

## 2012-10-09 LAB — RPR: RPR Ser Ql: NONREACTIVE

## 2012-10-09 MED ORDER — SODIUM BICARBONATE 8.4 % IV SOLN
INTRAVENOUS | Status: DC | PRN
  Administered 2012-10-09: 5 mL via EPIDURAL

## 2012-10-09 MED ORDER — FENTANYL 2.5 MCG/ML BUPIVACAINE 1/10 % EPIDURAL INFUSION (WH - ANES)
14.0000 mL/h | INTRAMUSCULAR | Status: DC | PRN
  Administered 2012-10-09 – 2012-10-10 (×2): 14 mL/h via EPIDURAL
  Filled 2012-10-09 (×2): qty 125

## 2012-10-09 MED ORDER — OXYTOCIN 40 UNITS IN LACTATED RINGERS INFUSION - SIMPLE MED
1.0000 m[IU]/min | INTRAVENOUS | Status: DC
  Administered 2012-10-09: 2 m[IU]/min via INTRAVENOUS

## 2012-10-09 MED ORDER — TERBUTALINE SULFATE 1 MG/ML IJ SOLN: 0.2500 mg | Freq: Once | INTRAMUSCULAR | Status: AC | PRN

## 2012-10-09 MED ORDER — DIPHENHYDRAMINE HCL 50 MG/ML IJ SOLN: 12.5000 mg | INTRAMUSCULAR | Status: DC | PRN

## 2012-10-09 MED ORDER — LACTATED RINGERS IV SOLN: 500.0000 mL | Freq: Once | INTRAVENOUS | Status: DC

## 2012-10-09 MED ORDER — EPHEDRINE 5 MG/ML INJ
10.0000 mg | INTRAVENOUS | Status: DC | PRN
  Filled 2012-10-09: qty 2

## 2012-10-09 MED ORDER — EPHEDRINE 5 MG/ML INJ
10.0000 mg | INTRAVENOUS | Status: DC | PRN
  Filled 2012-10-09: qty 4
  Filled 2012-10-09: qty 2

## 2012-10-09 MED ORDER — PHENYLEPHRINE 40 MCG/ML (10ML) SYRINGE FOR IV PUSH (FOR BLOOD PRESSURE SUPPORT)
80.0000 ug | PREFILLED_SYRINGE | INTRAVENOUS | Status: DC | PRN
  Filled 2012-10-09: qty 5
  Filled 2012-10-09: qty 2

## 2012-10-09 MED ORDER — PHENYLEPHRINE 40 MCG/ML (10ML) SYRINGE FOR IV PUSH (FOR BLOOD PRESSURE SUPPORT)
80.0000 ug | PREFILLED_SYRINGE | INTRAVENOUS | Status: DC | PRN
  Filled 2012-10-09: qty 2

## 2012-10-09 NOTE — Progress Notes (Signed)
Patient ID: Chelsey Santiago, female   DOB: [birth year], 37 y.o.   MRN: [id number]  S:  Feeling ctx moderate strength  O:   Filed Vitals:   10/08/12 2358 10/09/12 0103 10/09/12 0200 10/09/12 0251  BP: 162/86 137/69 163/97 171/94  Pulse: 87 86 82 82  Temp:      TempSrc:      Resp: 20 20    Height:      Weight:      SpO2:        Cervix: FT/thick/ballottable FHTS:  150, mod var, no accels, no decels TOCO:  q 2-3 min  A/P 37 y.o. [id number] at [redacted]w[redacted]d here for IOL for CHTN with superimposed preeclampsia - second dose of cytotec placed - IV labetalol for severe range BP, continue mag - GDM:  Last CBG 124 - continue to monitor q 4 hours until in labor then q 2 hours  Napoleon Form, MD

## 2012-10-09 NOTE — H&P (Signed)
Attestation of Attending Supervision of Advanced Practitioner (CNM/NP): Evaluation and management procedures were performed by the Advanced Practitioner under my supervision and collaboration.  I have reviewed the Advanced Practitioner's note and chart, and I agree with the management and plan.  Dorthea Maina 10/09/2012 6:31 AM   

## 2012-10-09 NOTE — Progress Notes (Signed)
   Chelsey Santiago is a 37 y.o. [phone] at [redacted]w[redacted]d  admitted for induction of labor due to Pre-eclamptic toxemia of pregnancy..  Subjective:  Contractions are getting stronger Objective: BP 123/66  Pulse 81  Temp(Src) 98.1 F (36.7 C) (Oral)  Resp 18  Ht 5\' 6"  (1.676 m)  Wt 116.121 kg (256 lb)  BMI 41.34 kg/m2  SpO2 99%  LMP 01/19/2012 Total I/O In: 1960 [P.O.:960; I.V.:800; IV Piggyback:200] Out: 3100 [Urine:3100]  FHT:  FHR: 140 bpm, variability: moderate,  accelerations:  Present,  decelerations:  Absent UC:   regular, every 2 minutes SVE:  Foley fell out.  PP not on cervix immediately afterwards, so cx exam was difficult and not deemed worth the discomfort to pursue at this point. Vtx by U/S Dilation:  (unable to reach) Effacement (%): Thick Station: Ballotable Exam by:: F. Cresenzo,CNM Pitocin @ 18 mu/min  Labs: Lab Results  Component Value Date   WBC 9.3 10/08/2012   HGB 9.2* 10/08/2012   HCT 29.1* 10/08/2012   MCV 71.0* 10/08/2012   PLT 182 10/08/2012    Assessment / Plan: Ripening phase  Labor: Progressing normally Fetal Wellbeing:  Category I Pain Control:  Labor support without medications Anticipated MOD:  NSVD  CRESENZO-DISHMAN,Shelina Luo 10/09/2012, 6:24 PM

## 2012-10-09 NOTE — MAU Provider Note (Signed)
Attestation of Attending Supervision of Advanced Practitioner (CNM/NP): Evaluation and management procedures were performed by the Advanced Practitioner under my supervision and collaboration.  I have reviewed the Advanced Practitioner's note and chart, and I agree with the management and plan.  Kregg Cihlar 10/09/2012 6:32 AM

## 2012-10-09 NOTE — Progress Notes (Signed)
   Chelsey Santiago is a 37 y.o. [phone] at [redacted]w[redacted]d  admitted for induction of labor due to Pre-eclamptic toxemia of pregnancy..  Subjective: Comfortable with epidura;  Objective: BP 138/88  Pulse 86  Temp(Src) 97.7 F (36.5 C) (Oral)  Resp 18  Ht 5\' 6"  (1.676 m)  Wt 116.121 kg (256 lb)  BMI 41.34 kg/m2  SpO2 96%  LMP 01/19/2012    FHT:  FHR: 150 bpm, variability: minimal ,  accelerations:  Abscent,  decelerations:  Present late UC:   irregular, every 1-4 minutes SVE:   Dilation: 4.5 Effacement (%): Thick Station: -3 Exam by:: F. Cresenzo-Dishmon, CNM Pitocin @ 18 mu/min MgSO4 at 2 gm/hr  Labs: Lab Results  Component Value Date   WBC 16.6* 10/09/2012   HGB 10.6* 10/09/2012   HCT 32.8* 10/09/2012   MCV 70.4* 10/09/2012   PLT 209 10/09/2012    Assessment / Plan: IOL d/t preeclampsia; ripening phase complete, attempting to establish labor; Position changes/IVF/O2 for FHR   Labor: no Fetal Wellbeing:  Category II Pain Control:  Epidural Anticipated MOD:  NSVD  CRESENZO-DISHMAN,Chelsey Santiago 10/09/2012, 8:59 PM

## 2012-10-09 NOTE — Progress Notes (Signed)
   Chelsey Santiago is a 37 y.o. [phone] at [redacted]w[redacted]d  admitted for induction of labor due to Hypertension.  Subjective: C/O HA  Objective: BP 148/94  Pulse 87  Temp(Src) 97.9 F (36.6 C) (Oral)  Resp 18  Ht 5\' 6"  (1.676 m)  Wt 116.121 kg (256 lb)  BMI 41.34 kg/m2  SpO2 99%  LMP 01/19/2012 Total I/O In: 100 [IV Piggyback:100] Out: 950 [Urine:950] CPG:  102 FHT:  FHR: 140 bpm, variability: moderate,  accelerations:  Present,  decelerations:  Absent UC:   irregular, every 2-6 minutes SVE:   Dilation: Fingertip Effacement (%): Thick Station: -3 Exam by:: Dr. Debroah Loop Pitocin @ 6 mu/min Beside u/s by Dr. Debroah Loop confirms vtx presentation with 7cm fibroid in lower uterine segment.  It does not appear to be obstructing  Labs: Lab Results  Component Value Date   WBC 9.3 10/08/2012   HGB 9.2* 10/08/2012   HCT 29.1* 10/08/2012   MCV 71.0* 10/08/2012   PLT 182 10/08/2012    Assessment / Plan: IOL for Severe range B/P, ripening phase Will treat HA with percocet; allow pt to eat grits, then place Foley Bulb Labor: no Fetal Wellbeing:  Category I Pain Control:  Labor support without medications Anticipated MOD:  NSVD  CRESENZO-DISHMAN,Cadey Bazile 10/09/2012, 9:25 AM

## 2012-10-09 NOTE — Progress Notes (Signed)
   Chelsey Santiago is a 37 y.o. [phone] at [redacted]w[redacted]d  admitted for induction of labor due to Pre-eclamptic toxemia of pregnancy..  Subjective: Contracting mildly Foley still in  Objective: BP 138/89  Pulse 78  Temp(Src) 97.7 F (36.5 C) (Oral)  Resp 18  Ht 5\' 6"  (1.676 m)  Wt 116.121 kg (256 lb)  BMI 41.34 kg/m2  SpO2 99%  LMP 01/19/2012 Total I/O In: 100 [IV Piggyback:100] Out: 2450 [Urine:2450]  FHT:  FHR: 140 bpm, variability: moderate,  accelerations:  Present,  decelerations:  Absent UC:   regular, every 3 minutes SVE:  Deferred Pitocin @ 10 mu/min MgSO4 @ 2gm/hr  Labs: Lab Results  Component Value Date   WBC 9.3 10/08/2012   HGB 9.2* 10/08/2012   HCT 29.1* 10/08/2012   MCV 71.0* 10/08/2012   PLT 182 10/08/2012    Assessment / Plan: IOL for preeclampsia, ripeing  Continue increasing Pitocin prn  Labor: Progressing normally Fetal Wellbeing:  Category I Pain Control:  Labor support without medications Anticipated MOD:  NSVD  CRESENZO-DISHMAN,Byrdie Miyazaki 10/09/2012, 2:45 PM

## 2012-10-09 NOTE — Progress Notes (Signed)
   Chelsey Santiago is a 37 y.o. [phone] at [redacted]w[redacted]d  admitted for induction of labor due to Pre-eclamptic toxemia of pregnancy..  Subjective: Comfortable with epidural  Objective: BP 121/53  Pulse 90  Temp(Src) 97.8 F (36.6 C) (Oral)  Resp 18  Ht 5\' 6"  (1.676 m)  Wt 116.121 kg (256 lb)  BMI 41.34 kg/m2  SpO2 96%  LMP 01/19/2012    FHT:  FHR 150's:  Has periods of minimal LTV, along with periods of moderate LTV;  10X 10accels;  occ periods of mild variables/occasional lates which resolve with position change.  Dr. Debroah Loop reviewed strip. UC:   irregular, every 1-4 minutes SVE:   Dilation: 4.5 Effacement (%): Thick Station: -3 Exam by:: F. Cresenzo-Dishmon, CNM Pitocin @ 18 mu/min MgSO4 at 2 gm/hr  Labs: Lab Results  Component Value Date   WBC 16.6* 10/09/2012   HGB 10.6* 10/09/2012   HCT 32.8* 10/09/2012   MCV 70.4* 10/09/2012   PLT 209 10/09/2012    Assessment / Plan: IOL d/t preeclampsia; ripening phase complete, attempting to establish labor;Labor: no Fetal Wellbeing: variable, b/t Category II and Category I Pain Control:  Epidural Anticipated MOD:  NSVD  CRESENZO-DISHMAN,Chelsey Santiago 10/09/2012, 11:45 PM

## 2012-10-09 NOTE — Progress Notes (Signed)
   Chelsey Santiago is a 37 y.o. [phone] at [redacted]w[redacted]d  admitted for induction of labor due to Hypertension.  Subjective:  Feeling crampy  Objective: BP 142/85  Pulse 80  Temp(Src) 97.5 F (36.4 C) (Oral)  Resp 18  Ht 5\' 6"  (1.676 m)  Wt 116.121 kg (256 lb)  BMI 41.34 kg/m2  SpO2 99%  LMP 01/19/2012 Total I/O In: 100 [IV Piggyback:100] Out: 1950 [Urine:1950]  FHT:  FHR: 140 bpm, variability: moderate,  accelerations:  Present,  decelerations:  Absent UC:   none SVE:   Dilation: Fingertip Effacement (%): Thick Station: Ballotable Exam by:: F. Cresenzo,CNM Foley inserted into cervix and inflated with 60cc H20  Labs: Lab Results  Component Value Date   WBC 9.3 10/08/2012   HGB 9.2* 10/08/2012   HCT 29.1* 10/08/2012   MCV 71.0* 10/08/2012   PLT 182 10/08/2012    Assessment / Plan: IOL for hypertension, ripening phase Will start pitocin with Foley Labor: no Fetal Wellbeing:  Category I Pain Control:  none Anticipated MOD:  NSVD  CRESENZO-DISHMAN,Zierra Laroque 10/09/2012, 12:34 PM

## 2012-10-09 NOTE — Anesthesia Procedure Notes (Signed)
Epidural Patient location during procedure: OB  Preanesthetic Checklist Completed: patient identified, site marked, surgical consent, pre-op evaluation, timeout performed, IV checked, risks and benefits discussed and monitors and equipment checked  Epidural Patient position: sitting Prep: site prepped and draped and DuraPrep Patient monitoring: continuous pulse ox and blood pressure Approach: midline Injection technique: LOR air  Needle:  Needle type: Tuohy  Needle gauge: 17 G Needle length: 9 cm and 9 Needle insertion depth: 7 cm Catheter type: closed end flexible Catheter size: 19 Gauge Catheter at skin depth: 14 cm Test dose: negative  Assessment Events: blood not aspirated, injection not painful, no injection resistance, negative IV test and no paresthesia  Additional Notes Dosing of Epidural:  1st dose, through catheter ............................................. epi 1:200K + Xylocaine 40 mg  2nd dose, through catheter, after waiting 3 minutes.....epi 1:200K + Xylocaine 60 mg    ( 2% Xylo charted as a single dose in Epic Meds for ease of charting; actual dosing was fractionated as above, for saftey's sake)  As each dose occurred, patient was free of IV sx; and patient exhibited no evidence of SA injection.  Patient is more comfortable after epidural dosed. Please see RN's note for documentation of vital signs,and FHR which are stable.  Patient reminded not to try to ambulate with numb legs, and that an RN must be present when she attempts to get up.       

## 2012-10-09 NOTE — Anesthesia Preprocedure Evaluation (Addendum)
Anesthesia Evaluation  Patient identified by MRN, date of birth, ID band Patient awake    Reviewed: Allergy & Precautions, H&P , Patient's Chart, lab work & pertinent test results  Airway Mallampati: III TM Distance: >3 FB Neck ROM: full    Dental  (+) Teeth Intact   Pulmonary sleep apnea ,  breath sounds clear to auscultation        Cardiovascular hypertension, On Medications +CHF Rhythm:regular Rate:Normal     Neuro/Psych    GI/Hepatic   Endo/Other  diabetes  Renal/GU      Musculoskeletal   Abdominal   Peds  Hematology   Anesthesia Other Findings  DM HTN OSA Hx of CHF Normal  EF MO Hx of PIH     Reproductive/Obstetrics (+) Pregnancy                           Anesthesia Physical Anesthesia Plan  ASA: III  Anesthesia Plan: Epidural   Post-op Pain Management:    Induction:   Airway Management Planned:   Additional Equipment:   Intra-op Plan:   Post-operative Plan:   Informed Consent: I have reviewed the patients History and Physical, chart, labs and discussed the procedure including the risks, benefits and alternatives for the proposed anesthesia with the patient or authorized representative who has indicated his/her understanding and acceptance.   Dental Advisory Given  Plan Discussed with:   Anesthesia Plan Comments: (Labs checked- platelets confirmed with RN in room. Fetal heart tracing, per RN, reported to be stable enough for sitting procedure. Discussed epidural, and patient consents to the procedure:  included risk of possible headache,backache, failed block, allergic reaction, and nerve injury. This patient was asked if she had any questions or concerns before the procedure started. )        Anesthesia Quick Evaluation

## 2012-10-10 ENCOUNTER — Other Ambulatory Visit: Payer: Medicaid Other

## 2012-10-10 ENCOUNTER — Encounter: Payer: Medicaid Other | Admitting: Women's Health

## 2012-10-10 LAB — CBC
HCT: 30.9 % — ABNORMAL LOW (ref 36.0–46.0)
MCHC: 31.7 g/dL (ref 30.0–36.0)
MCV: 70.9 fL — ABNORMAL LOW (ref 78.0–100.0)
RDW: 14.5 % (ref 11.5–15.5)

## 2012-10-10 LAB — GLUCOSE, CAPILLARY
Glucose-Capillary: 199 mg/dL — ABNORMAL HIGH (ref 70–99)
Glucose-Capillary: 99 mg/dL (ref 70–99)

## 2012-10-10 LAB — US OB FOLLOW UP

## 2012-10-10 MED ORDER — PRENATAL MULTIVITAMIN CH
1.0000 | ORAL_TABLET | Freq: Every day | ORAL | Status: DC
  Administered 2012-10-11: 1 via ORAL
  Filled 2012-10-10: qty 1

## 2012-10-10 MED ORDER — MAGNESIUM SULFATE 40 G IN LACTATED RINGERS - SIMPLE: 2.0000 g/h | INTRAVENOUS | Status: DC

## 2012-10-10 MED ORDER — DIBUCAINE 1 % RE OINT: 1.0000 "application " | TOPICAL_OINTMENT | RECTAL | Status: DC | PRN

## 2012-10-10 MED ORDER — MAGNESIUM SULFATE 40 G IN LACTATED RINGERS - SIMPLE
2.0000 g/h | INTRAVENOUS | Status: AC
  Administered 2012-10-10: 2 g/h via INTRAVENOUS
  Filled 2012-10-10 (×2): qty 500

## 2012-10-10 MED ORDER — BENZOCAINE-MENTHOL 20-0.5 % EX AERO: 1.0000 "application " | INHALATION_SPRAY | CUTANEOUS | Status: DC | PRN

## 2012-10-10 MED ORDER — ONDANSETRON HCL 4 MG/2ML IJ SOLN: 4.0000 mg | INTRAMUSCULAR | Status: DC | PRN

## 2012-10-10 MED ORDER — SIMETHICONE 80 MG PO CHEW
80.0000 mg | CHEWABLE_TABLET | ORAL | Status: DC | PRN
  Administered 2012-10-11: 80 mg via ORAL

## 2012-10-10 MED ORDER — DIPHENHYDRAMINE HCL 25 MG PO CAPS: 25.0000 mg | ORAL_CAPSULE | Freq: Four times a day (QID) | ORAL | Status: DC | PRN

## 2012-10-10 MED ORDER — LANOLIN HYDROUS EX OINT: TOPICAL_OINTMENT | CUTANEOUS | Status: DC | PRN

## 2012-10-10 MED ORDER — WITCH HAZEL-GLYCERIN EX PADS: 1.0000 "application " | MEDICATED_PAD | CUTANEOUS | Status: DC | PRN

## 2012-10-10 MED ORDER — ONDANSETRON HCL 4 MG PO TABS: 4.0000 mg | ORAL_TABLET | ORAL | Status: DC | PRN

## 2012-10-10 MED ORDER — SENNOSIDES-DOCUSATE SODIUM 8.6-50 MG PO TABS
2.0000 | ORAL_TABLET | Freq: Every day | ORAL | Status: DC
  Administered 2012-10-10 – 2012-10-11 (×2): 2 via ORAL

## 2012-10-10 MED ORDER — ZOLPIDEM TARTRATE 5 MG PO TABS: 5.0000 mg | ORAL_TABLET | Freq: Every evening | ORAL | Status: DC | PRN

## 2012-10-10 MED ORDER — OXYCODONE-ACETAMINOPHEN 5-325 MG PO TABS
1.0000 | ORAL_TABLET | ORAL | Status: DC | PRN
  Administered 2012-10-10 – 2012-10-12 (×3): 1 via ORAL
  Filled 2012-10-10 (×3): qty 1

## 2012-10-10 MED ORDER — IBUPROFEN 600 MG PO TABS
600.0000 mg | ORAL_TABLET | Freq: Four times a day (QID) | ORAL | Status: DC
  Administered 2012-10-10 – 2012-10-12 (×7): 600 mg via ORAL
  Filled 2012-10-10 (×7): qty 1

## 2012-10-10 MED ORDER — TETANUS-DIPHTH-ACELL PERTUSSIS 5-2.5-18.5 LF-MCG/0.5 IM SUSP
0.5000 mL | Freq: Once | INTRAMUSCULAR | Status: AC
  Administered 2012-10-11: 0.5 mL via INTRAMUSCULAR
  Filled 2012-10-10: qty 0.5

## 2012-10-10 MED ORDER — LACTATED RINGERS IV SOLN
INTRAVENOUS | Status: DC
  Administered 2012-10-10: 13:00:00 via INTRAVENOUS

## 2012-10-10 NOTE — Progress Notes (Signed)
Platelet count 198---removed epidural per MD order

## 2012-10-10 NOTE — Progress Notes (Signed)
UR completed 

## 2012-10-10 NOTE — Anesthesia Postprocedure Evaluation (Signed)
Anesthesia Post Note  Patient: Chelsey Santiago  Procedure(s) Performed: * No procedures listed *  Anesthesia type: Epidural  Patient location: AICU  Post pain: Pain level controlled  Post assessment: Post-op Vital signs reviewed  Last Vitals:  Filed Vitals:   10/10/12 1421  BP: 148/87  Pulse: 77  Temp: 36.6 C  Resp: 20    Post vital signs: Reviewed  Level of consciousness:alert  Complications: No apparent anesthesia complications

## 2012-10-10 NOTE — Progress Notes (Signed)
   Chelsey Santiago is a 37 y.o. [phone] at [redacted]w[redacted]d  admitted for induction of labor due to Pre-eclamptic toxemia of pregnancy..  Subjective: Comfortable with epidural  Objective: BP 117/68  Pulse 80  Temp(Src) 98.2 F (36.8 C) (Oral)  Resp 18  Ht 5\' 6"  (1.676 m)  Wt 116.121 kg (256 lb)  BMI 41.34 kg/m2  SpO2 96%  LMP 01/19/2012 Total I/O In: 1399 [P.O.:480; I.V.:719; IV Piggyback:200] Out: 850 [Urine:850]  FHT:  FHR 150's:  Has periods of minimal LTV, along with periods of moderate LTV;  10X 10accels;  occ periods of mild variables/occasional lates which resolve with position change.  UC:   irregular, every 1-4 minutes.  IUPC placed MVU ~ 200 mmHg SVE:   Dilation: 5.5 Effacement (%): Thick Station: -3 Exam by:: F. Cresenzo-Dishmon CNM Pitocin @ 20 mu/min MgSO4 at 2 gm/hr  Labs: Lab Results  Component Value Date   WBC 16.6* 10/09/2012   HGB 10.6* 10/09/2012   HCT 32.8* 10/09/2012   MCV 70.4* 10/09/2012   PLT 209 10/09/2012    Assessment / Plan: IOL d/t preeclampsia; ripening phase complete, attempting to establish labor ;Labor: early Fetal Wellbeing: variable, b/t Category II and Category I Pain Control:  Epidural Anticipated MOD:  NSVD  CRESENZO-DISHMAN,Jermika Olden 10/10/2012, 2:46 AM

## 2012-10-10 NOTE — Progress Notes (Signed)
CSW received call from bedside RN stating MOB has informed staff that there is no FOB involved and has requested to list her 37 year old daughter as the significant other for NICU visitation purposes.  CSW does not see a problem with this, but requests that nursing administration make final decision.  RN states she has spoke with AD.  CSW requests that NICU staff notify CSW if there are concerns when 37 year old is in unit.

## 2012-10-11 LAB — CBC
HCT: 23.9 % — ABNORMAL LOW (ref 36.0–46.0)
MCH: 23.2 pg — ABNORMAL LOW (ref 26.0–34.0)
MCHC: 32.6 g/dL (ref 30.0–36.0)
RDW: 14.6 % (ref 11.5–15.5)

## 2012-10-11 LAB — GLUCOSE, CAPILLARY
Glucose-Capillary: 92 mg/dL (ref 70–99)
Glucose-Capillary: 130 mg/dL — ABNORMAL HIGH (ref 70–99)

## 2012-10-11 MED ORDER — LABETALOL HCL 200 MG PO TABS
200.0000 mg | ORAL_TABLET | Freq: Two times a day (BID) | ORAL | Status: DC
  Administered 2012-10-11 – 2012-10-12 (×2): 200 mg via ORAL
  Filled 2012-10-11 (×2): qty 1

## 2012-10-11 NOTE — Progress Notes (Signed)
Post Partum Day1 Subjective: no complaints, up ad lib, voiding, tolerating PO and + flatus  Objective: Blood pressure 135/73, pulse 83, temperature 98 F (36.7 C), temperature source Oral, resp. rate 18, height 5\' 6"  (1.676 m), weight 116.121 kg (256 lb), last menstrual period 01/19/2012, SpO2 99.00%.  Physical Exam:  General: alert Lochia: appropriate Uterine Fundus: firm Incision: n/a DVT Evaluation: No evidence of DVT seen on physical exam.   Recent Labs  10/10/12 0700 10/11/12 0505  HGB 9.8* 7.8*  HCT 30.9* 23.9*    Assessment/Plan: Plan for discharge tomorrow She will go to the regular floor today   LOS: 3 days   Chelsey Santiago C. 10/11/2012, 7:00 AM

## 2012-10-11 NOTE — Clinical Social Work Maternal (Signed)
    Clinical Social Work Department PSYCHOSOCIAL ASSESSMENT - MATERNAL/CHILD 10/11/2012  Patient:  Chelsey Santiago, Chelsey Santiago  Account Number:  [id number]  Admit Date:  10/08/2012  Marjo Bicker Name:   Amelia Jo    Clinical Social Worker:  Nobie Putnam, LCSW   Date/Time:  10/11/2012 11:00 AM  Date Referred:  10/11/2012   Referral source  NICU     Referred reason  NICU  Depression/Anxiety   Other referral source:    I:  FAMILY / HOME ENVIRONMENT Child's legal guardian:  PARENT  Guardian - Name Guardian - Age Guardian - Address  Tanashia Ciesla [address]; [place], [place] [address]  Madie Reno 38    Other household support members/support persons Name Relationship DOB   DAUGHTER 72 years old   SON 19 years old   SON 38 years old   SON 40 years old   Other support:   Pollyann Glen, pt's grandmother    II  PSYCHOSOCIAL DATA Information Source:  Patient Interview  Event organiser Employment:   Surveyor, quantity resources:  [employer] If Medicaid - County:  [employer] Other  Sales executive  WIC   School / Grade:   Maternity Care Coordinator / Child Services Coordination / Early Interventions:  Cultural issues impacting care:    III  STRENGTHS Strengths  Adequate Resources  Home prepared for Child (including basic supplies)  Supportive family/friends   Strength comment:    IV  RISK FACTORS AND CURRENT PROBLEMS Current Problem:  YES   Risk Factor & Current Problem Patient Issue Family Issue Risk Factor / Current Problem Comment  Mental Illness Y N Depression/anxiety   N N     V  SOCIAL WORK ASSESSMENT CSW met with 31 year old, G9P5 referred for an assessment of her current social situation.  Pt lives alone with her children.  She identified her grandmother, as her primary support person.  Pt's depression/anxiety symptoms are currently being managed with Effexor.  She plans to continue taking the medication upon discharge.  Pt was not able to identify the  source of her depression. She denies any SI history.  Pt does not appear to be emotional about the infant being in the NICU & hopeful that he will be discharged soon.  Pt has all the necessary supplies for the infant.  CSW will provide pt with a list of pediatrician, as requested.  CSW will continue to assist pt until discharge, as needed.      VI SOCIAL WORK PLAN Social Work Plan  No Further Intervention Required / No Barriers to Discharge   Type of pt/family education:   If child protective services report - county:   If child protective services report - date:   Information/referral to community resources comment:   Other social work plan:

## 2012-10-12 ENCOUNTER — Encounter (HOSPITAL_COMMUNITY): Payer: Self-pay | Admitting: *Deleted

## 2012-10-12 DIAGNOSIS — O1410 Severe pre-eclampsia, unspecified trimester: Secondary | ICD-10-CM

## 2012-10-12 LAB — TYPE AND SCREEN
ABO/RH(D): O NEG
Antibody Screen: POSITIVE
Unit division: 0

## 2012-10-12 MED ORDER — HYDROCHLOROTHIAZIDE 25 MG PO TABS
25.0000 mg | ORAL_TABLET | Freq: Every day | ORAL | Status: DC
  Administered 2012-10-12: 25 mg via ORAL
  Filled 2012-10-12: qty 1

## 2012-10-12 MED ORDER — FERROUS SULFATE 325 (65 FE) MG PO TABS: 325.0000 mg | ORAL_TABLET | Freq: Three times a day (TID) | ORAL | Status: DC

## 2012-10-12 MED ORDER — IBUPROFEN 600 MG PO TABS: 600.0000 mg | ORAL_TABLET | Freq: Four times a day (QID) | ORAL | Status: DC

## 2012-10-12 MED ORDER — LABETALOL HCL 200 MG PO TABS: 200.0000 mg | ORAL_TABLET | Freq: Two times a day (BID) | ORAL | Status: DC

## 2012-10-12 MED ORDER — HYDROCHLOROTHIAZIDE 25 MG PO TABS: 25.0000 mg | ORAL_TABLET | Freq: Every day | ORAL | Status: DC

## 2012-10-12 MED ORDER — SENNOSIDES-DOCUSATE SODIUM 8.6-50 MG PO TABS: 2.0000 | ORAL_TABLET | Freq: Every day | ORAL | Status: DC

## 2012-10-12 NOTE — Discharge Summary (Signed)
Obstetric Discharge Summary Reason for Admission: induction of labor Prenatal Procedures: ultrasound Intrapartum Procedures: spontaneous vaginal delivery Postpartum Procedures: transfusion magnesium Complications-Operative and Postpartum: hypertension  Chelsey Santiago is a 37 y.o. female [phone] who presented to the MAU with elevated blood pressures at [redacted]w[redacted]d with chronic HTN, class B diabetes, anemia, and depression.  She was started on magnesium and following SVD at 0604 on 5/27, was sent to the ICU for 24 hours.  After her magnesium transfusion was complete, she was transferred to the mother/baby unit and reunited with her infant daughter.  She reports that she has been urinating regularly and making bowel movements and passing flatus. She denies dizziness upon standing. She states she is still having mild cramping pain, but that oral ibuprofen has helped. She plans to bottle feed exclusively and will seek an IUD for her birth control needs, but hasn't fully made her decision.  She will continue to see [facility] where she receives OBGYN care.     Hemoglobin  Date Value Range Status  10/11/2012 7.8* 12.0 - 15.0 g/dL Final     REPEATED TO VERIFY     DELTA CHECK NOTED     HCT  Date Value Range Status  10/11/2012 23.9* 36.0 - 46.0 % Final    Physical Exam:   General: alert, cooperative and morbidly obese CVS: RRR, good S1 and S2, no RGM  Lungs: CTA bilaterally, no wheeze, rales, or rhonchi  ABD: Normoactive bowel sounds x4, non-tender, non-distended PVS: 2+ pitting edema bilaterally to the knee Lochia: appropriate Uterine Fundus: firm Incision: n/a  DVT Evaluation: No evidence of DVT seen on physical exam.  Negative Homan's sign.  Discharge Diagnoses: Chronic HTN, class B diabetes, anemia, and depression  Discharge Information: Date: 10/12/2012 Activity: pelvic rest Diet: routine Medications: Ibuprofen, Colace, Iron and HCTZ, Metformin, and Labetalol. Condition:  stable Instructions: refer to practice specific booklet Discharge to: home.    Follow up with Cardiologist Dr Ulyess Mort.    Follow-up Information   Follow up with [facility]. Schedule an appointment as soon as possible for a visit in 1 week. (For blood pressure check and in 6 weeks for postpartum check)    Contact information:   [address] [place] [address] [phone]      Newborn Data: Live born female  Birth Weight: 5 lb 13.1 oz (2640 g) APGAR: 5, 6  Home with mother.  Anna Genre 10/12/2012, 9:40 AM  I saw and examined patient and agree with above student note. I reviewed history, delivery summary, labs and vitals. Patient will call her cardiologist to discuss appropriate medications for her hypertension. She had pulmonary edema and transient CHF following her last delivery but has since had a normal 2D echo. She does have left ventricular concentric hypertrophy likely related to her HTN. However, today she shows no sign of pulmonary edema, and cardiovascular exam in benign. She is being discharged on HCTZ. She has lasix at home if needed. She was advised to monitor her weight daily and call [facility] or her cardiologist if there is any significant weight gain. Napoleon Form, MD

## 2012-10-13 ENCOUNTER — Telehealth: Payer: Self-pay | Admitting: *Deleted

## 2012-10-13 NOTE — Telephone Encounter (Signed)
PT NEEDS ADVISE ON WHAT MEDICATIONS TO TAKE . SHE WAS PREGNANT THE LAST TIME WE SEEN HER, SHE HAS HAD BABY NOW AND NEEDS TO KNOW WHAT MEDS SHE NEEDS TO BE TAKING NOW.  I MADE HER APP WITH DR RR 10/26/12 HIS FIRST AVAIL.

## 2012-10-13 NOTE — Telephone Encounter (Signed)
Pt denies any sxs of concern from a cardiac standpoint at this time, however noted in third trimester and last OV DX of preeclampsia, pt has not checked BP since discharge from labor and delivery noted 10-08-12, please advise if any changes need to be made to pt medications prior to f/u OV  Nazyia Gaugh A. Raini Tiley L.P.N.  FYI: D/C summary and VS from last OV 05-23-12 with [facility]   Your physician recommends that you schedule a follow-up appointment in: 2 months  Your physician has recommended you make the following change in your medication:  1 - TAKE Labetalol 400 mg twice a day  2 - STOP Goody powder  BP 160/90  Pulse 92  Ht 5\' 6"  (1.676 m)  Wt 244 lb 8 oz (110.904 kg)  BMI 39.46 kg/m2  SpO2 98%  LMP 12/19/2011   Pt did not follow up as advised

## 2012-10-13 NOTE — Telephone Encounter (Signed)
Nursing visit ASAP.   Check blood pressure.    Determine if new baby is being breast fed    Verify current medications

## 2012-10-13 NOTE — Telephone Encounter (Signed)
Late entry: left vm for pt to call office to come in for NV today at 1:45pm   Pt advised that she is NOT breastfeeding Pt is currently taking:  1) LABETALOL 200mg  (taking 400mg  twice daily) (was advised to take only 200mg  twice daily by OB) 2) LASIX 20MG  QD ( PT ADVISED TO TAKE HCTZ 25, PT DECIDED TO TAKE LASIX INSTEAD PER ON PRIOR TO PREGNANCY)  3) PROCARDIA 90 ONCE DAILY 4) PT NOT CURRENTLY TAKING THE SENOKOT, IRON NOR IBUPROFEN  5) PT IS TAKING EFFEXOR   Pt has not checked BP since leaving the hospital, pt accepted apt for NV to re-check BP on Monday 10-16-12 at 10am

## 2012-10-16 ENCOUNTER — Encounter: Payer: Self-pay | Admitting: *Deleted

## 2012-10-16 ENCOUNTER — Ambulatory Visit (INDEPENDENT_AMBULATORY_CARE_PROVIDER_SITE_OTHER): Payer: Medicaid Other | Admitting: *Deleted

## 2012-10-16 VITALS — BP 130/100 | HR 84 | Ht 66.0 in | Wt 238.0 lb

## 2012-10-16 DIAGNOSIS — I10 Essential (primary) hypertension: Secondary | ICD-10-CM

## 2012-10-16 NOTE — Patient Instructions (Addendum)
Your physician recommends that you schedule a follow-up appointment in: TO BE DETERMINED   

## 2012-10-16 NOTE — Progress Notes (Signed)
Pt presented to NV to have BP check per recent delivery and medication adjustment need post pregnancy Pt advised medications at discharge however pt noted to self medicate (noted pt chart to flag non-compliance with medications) Pt complained of bi-lateral feet still swelling, currently takes Lasix 20mg  daily, noted Right foot worse than the left foot, hard to move at times, pt notes she does elevate as much as possible and did not have swelling prior to pregnancy Please see phone note 10-13-12 for any further details needed Please advise any changes of medications  Ladonna Vanorder A. Kamarius Buckbee L.P.N.  LAST OV D/C SUMMARY AND VS WITH KL ON 05-23-12  BP 160/90  Pulse 92  Ht 5\' 6"  (1.676 m)  Wt 244 lb 8 oz (110.904 kg)  BMI 39.46 kg/m2  SpO2 98%  LMP 12/19/2011  Your physician recommends that you schedule a follow-up appointment in: 2 months (PT DID NOT FOLLOW UP AS ADVISED) Your physician has recommended you make the following change in your medication:  1 - TAKE Labetalol 400 mg twice a day  2 - STOP Goody powder

## 2012-10-16 NOTE — Telephone Encounter (Signed)
Noted. Await blood pressure determination.

## 2012-10-16 NOTE — Telephone Encounter (Signed)
See clinical support notation/OV from [facility] NP today 10-16-12 per pt concerns,(also routed to RR) still pending pt return call to advise instructions

## 2012-10-16 NOTE — Progress Notes (Signed)
.  left message to have patient return my call to advise [facility] NP instructions sent via cc chart as follows:  Please get labs BMET,CBC for evaluation of anemia and kidney fx with edema post-partem. Diastolic BP elevated, compared to while she was in the hospital 136/ 76. She was edematous on discharge summary. She will continue current medication regimen for now. May need to add back HCTZ and use lasix prn. She is to avoid NSAIDS. Please make appt for her to be seen.Consider ambulatory BP monitor. ----- Message ----- From: Ovidio Kin, LPN Sent: 4/0/9811 10:49 AM To: Jodelle Gross, NP

## 2012-10-17 ENCOUNTER — Encounter: Payer: Self-pay | Admitting: Cardiology

## 2012-10-17 NOTE — Progress Notes (Signed)
.  left message to have patient return my call. Advised it is imperative for the pt to contact our office, pt has not returned to work at this time, unable to contact via work

## 2012-10-18 ENCOUNTER — Encounter: Payer: Self-pay | Admitting: Obstetrics & Gynecology

## 2012-10-18 ENCOUNTER — Ambulatory Visit (INDEPENDENT_AMBULATORY_CARE_PROVIDER_SITE_OTHER): Payer: Medicaid Other | Admitting: Obstetrics & Gynecology

## 2012-10-18 VITALS — BP 174/98 | Temp 99.8°F | Wt 239.0 lb

## 2012-10-18 DIAGNOSIS — R5381 Other malaise: Secondary | ICD-10-CM

## 2012-10-18 DIAGNOSIS — Z130 Encounter for screening for diseases of the blood and blood-forming organs and certain disorders involving the immune mechanism: Secondary | ICD-10-CM

## 2012-10-18 DIAGNOSIS — Z1389 Encounter for screening for other disorder: Secondary | ICD-10-CM

## 2012-10-18 DIAGNOSIS — N719 Inflammatory disease of uterus, unspecified: Secondary | ICD-10-CM

## 2012-10-18 DIAGNOSIS — Z331 Pregnant state, incidental: Secondary | ICD-10-CM

## 2012-10-18 DIAGNOSIS — R5383 Other fatigue: Secondary | ICD-10-CM

## 2012-10-18 LAB — POCT HEMOGLOBIN: Hemoglobin: 9.7 g/dL — AB (ref 12.2–16.2)

## 2012-10-18 MED ORDER — OMEPRAZOLE 20 MG PO CPDR: DELAYED_RELEASE_CAPSULE | ORAL | Status: DC

## 2012-10-18 MED ORDER — CIPROFLOXACIN HCL 500 MG PO TABS: 500.0000 mg | ORAL_TABLET | Freq: Two times a day (BID) | ORAL | Status: DC

## 2012-10-18 MED ORDER — OXYCODONE-ACETAMINOPHEN 7.5-325 MG PO TABS: 1.0000 | ORAL_TABLET | Freq: Four times a day (QID) | ORAL | Status: DC | PRN

## 2012-10-18 NOTE — Addendum Note (Signed)
Addended by: Richardson Chiquito on: 10/18/2012 02:21 PM   Modules accepted: Orders

## 2012-10-18 NOTE — Progress Notes (Signed)
Patient ID: Chelsey Santiago, female   DOB: 02/10/1976, 37 y.o.   MRN: [id number] Patient 9 days post partum from NSVD  Has center of chest heaviness Exam clear with ggooe air movement Pain goes through to back  Probably esophaeal spasm from reflux   Also complaining of lower abdominal pain and chills No trouble with placental delivery Exam Fibroid is very tender on palpation  Probably central placental necrosis vs endometritis Will treat for endometritis with Ciprofloxacin even though probably degenerating fbroid

## 2012-10-19 NOTE — Progress Notes (Signed)
.  left message to have patient return my call. Also noted the following notations from [facility] and RR   Reviewed most recent labs on 10/11/2012. She was found to be anemic with Hgb of 7.1. She will need to see PCP or OB-GYN for further recommendations post-partem concerning anemia. She has a history of anemia. Likely contributing to edema.  PER KL NP  Add to labs ordered by Ms. Lawrence: Hemoglobin electrophoresis, iron, iron binding, ferritin, stool Hemoccult, UA      Home blood pressure determinations; no need for 24-hour ambulatory monitor at present.  PER DR  DR RR MADE AWARE VERBALLY THAT PT IS UNREACHABLE, WILL CONTINUE TO CONTACT

## 2012-10-20 NOTE — Progress Notes (Signed)
.  left message to have patient return my call.  

## 2012-10-23 ENCOUNTER — Telehealth: Payer: Self-pay | Admitting: *Deleted

## 2012-10-23 NOTE — Telephone Encounter (Signed)
Patient lives in [place] and is at [place] urgent [facility] due to sore throat.  Patient states she does not have gas money to come to our office today.  Due to patient having Medicaid, office calling to request NPI #  to authorize appt.  NPI # given for this visit only.  Patient informed that she will need to make an appt with our office soon since her last visit was 09/2011.  Gaylene Brooks, RN

## 2012-10-23 NOTE — Progress Notes (Signed)
.  left message to have patient return my call.  

## 2012-10-25 ENCOUNTER — Ambulatory Visit: Payer: Medicaid Other | Admitting: Cardiology

## 2012-10-25 ENCOUNTER — Encounter: Payer: Medicaid Other | Admitting: Obstetrics and Gynecology

## 2012-10-25 NOTE — Progress Notes (Signed)
.  left message to have patient return my call. KL NP made aware verbally that the pt has not returned calls, noted pt has f/u apt tomorrow 10-26-12 with [facility] NP

## 2012-10-26 ENCOUNTER — Ambulatory Visit: Payer: Medicaid Other | Admitting: Cardiology

## 2012-10-26 NOTE — Progress Notes (Signed)
Cancelled.  

## 2012-10-27 ENCOUNTER — Encounter: Payer: Medicaid Other | Admitting: Adult Health

## 2012-10-27 NOTE — Progress Notes (Signed)
Noted pt called in today 10-27-12 and cancelled her apt to see KL, rescheduled apt for 11-13-12 with [facility], left message to advise pt to call office back asap to address concerns from her NV

## 2012-10-30 ENCOUNTER — Ambulatory Visit: Payer: Medicaid Other | Admitting: Obstetrics & Gynecology

## 2012-11-01 ENCOUNTER — Encounter: Payer: Medicaid Other | Admitting: Advanced Practice Midwife

## 2012-11-01 NOTE — Progress Notes (Signed)
Pt had an appt at 1530.  RN went to call her back ('before 1600") and pt had gotten mad and left.  RN went to parking lot to see if she could fine Chelsey Santiago And she was gone.

## 2012-11-09 ENCOUNTER — Telehealth: Payer: Self-pay | Admitting: *Deleted

## 2012-11-09 NOTE — Telephone Encounter (Signed)
[facility] called needing [place] authorization.  NPI number given to [place] at [phone]. Nataleigh Griffin, Darlyne Russian, CMA

## 2012-11-13 ENCOUNTER — Encounter: Payer: Medicaid Other | Admitting: Adult Health

## 2012-11-13 NOTE — Progress Notes (Signed)
No show

## 2012-11-13 NOTE — Progress Notes (Signed)
Pt no showed for apt once again today 11-13-12, KL made aware

## 2013-01-09 ENCOUNTER — Encounter: Payer: Self-pay | Admitting: *Deleted

## 2013-02-06 ENCOUNTER — Ambulatory Visit: Payer: Medicaid Other | Admitting: Family Medicine

## 2013-03-12 LAB — US OB COMP LESS 14 WKS

## 2013-03-15 ENCOUNTER — Encounter: Payer: Self-pay | Admitting: Adult Health

## 2013-03-15 ENCOUNTER — Ambulatory Visit (INDEPENDENT_AMBULATORY_CARE_PROVIDER_SITE_OTHER): Payer: Medicaid Other | Admitting: Adult Health

## 2013-03-15 VITALS — BP 140/100 | Ht 66.0 in | Wt 244.0 lb

## 2013-03-15 DIAGNOSIS — O200 Threatened abortion: Secondary | ICD-10-CM

## 2013-03-15 DIAGNOSIS — O10019 Pre-existing essential hypertension complicating pregnancy, unspecified trimester: Secondary | ICD-10-CM

## 2013-03-15 DIAGNOSIS — I10 Essential (primary) hypertension: Secondary | ICD-10-CM

## 2013-03-15 HISTORY — DX: Threatened abortion: O20.0

## 2013-03-15 NOTE — Progress Notes (Signed)
Subjective:     Patient ID: Chelsey Santiago, female   DOB: [birth year], 37 y.o.   MRN: [id number]  HPI Chelsey Santiago is a 37 year old black female in for bleeding that started after sex Monday, she was seen at [facility] and had [place] that did not show IUP, QHCG 1857, she is 0- and hgb 10.9.Had rho gram in ER. Has taken BP meds but not lasix today.Had vaginal delivery May 27,2014.She first had +pregnancy test 10/10 at home.  Review of Systems See HPI Reviewed past medical,surgical, social and family history. Reviewed medications and allergies.     Objective:   Physical Exam BP 140/100  Ht 5\' 6"  (1.676 m)  Wt 244 lb (110.678 kg)  BMI 39.4 kg/m2  LMP 01/17/2013  Breastfeeding? NoNo exam, she is still bleeding and I reviewed reports from [place] with her.  BP was 158/106  On recheck and was up in ER  Assessment:     Threatened miscarriage Hypertension     Plan:     Check QHCG and progesterone Return in 4 days for [place] Go to ER if any pain   Review handout on miscariage Take a lasix today, take Fe, no sex

## 2013-03-15 NOTE — Patient Instructions (Signed)
Threatened Miscarriage Bleeding during the first 20 weeks of pregnancy is common. This is sometimes called a threatened miscarriage. This is a pregnancy that is threatening to end before the twentieth week of pregnancy. Often this bleeding stops with bed rest or decreased activities as suggested by your caregiver and the pregnancy continues without any more problems. You may be asked to not have sexual intercourse, have orgasms or use tampons until further notice. Sometimes a threatened miscarriage can progress to a complete or incomplete miscarriage. This may or may not require further treatment. Some miscarriages occur before a woman misses a menstrual period and knows she is pregnant. Miscarriages occur in 43 to 20% of all pregnancies and usually occur during the first 13 weeks of the pregnancy. The exact cause of a miscarriage is usually never known. A miscarriage is natures way of ending a pregnancy that is abnormal or would not make it to term. There are some things that may put you at risk to have a miscarriage, such as:  Hormone problems.  Infection of the uterus or cervix.  Chronic illness, diabetes for example, especially if it is not controlled.  Abnormal shaped uterus.  Fibroids in the uterus.  Incompetent cervix (the cervix is too weak to hold the baby).  Smoking.  Drinking too much alcohol. It's best not to drink any alcohol when you are pregnant.  Taking illegal drugs. TREATMENT  When a miscarriage becomes complete and all products of conception (all the tissue in the uterus) have been passed, often no treatment is needed. If you think you passed tissue, save it in a container and take it to your doctor for evaluation. If the miscarriage is incomplete (parts of the fetus or placenta remain in the uterus), further treatment may be needed. The most common reason for further treatment is continued bleeding (hemorrhage) because pregnancy tissue did not pass out of the uterus. This  often occurs if a miscarriage is incomplete. Tissue left behind may also become infected. Treatment usually is dilatation and curettage (the removal of the remaining products of pregnancy. This can be done by a simple sucking procedure (suction curettage) or a simple scraping of the inside of the uterus. This may be done in the hospital or in the caregiver's office. This is only done when your caregiver knows that there is no chance for the pregnancy to proceed to term. This is determined by physical examination, negative pregnancy test, falling pregnancy hormone count and/or, an ultrasound revealing a dead fetus. Miscarriages are often a very emotional time for prospective mothers and fathers. This is not you or your partners fault. It did not occur because of an inadequacy in you or your partner. Nearly all miscarriages occur because the pregnancy has started off wrongly. At least half of these pregnancies have a chromosomal abnormality. It is almost always not inherited. Others may have developmental problems with the fetus or placenta. This does not always show up even when the products miscarried are studied under the microscope. The miscarriage is nearly always not your fault and it is not likely that you could have prevented it from happening. If you are having emotional and grieving problems, talk to your health care provider and even seek counseling, if necessary, before getting pregnant again. You can begin trying for another pregnancy as soon as your caregiver says it is OK. HOME CARE INSTRUCTIONS   Your caregiver may order bed rest depending on how much bleeding and cramping you are having. You may be limited  to only getting up to go to the bathroom. You may be allowed to continue light activity. You may need to make arrangements for the care of your other children and for any other responsibilities.  Keep track of the number of pads you use each day, how often you have to change pads and how  saturated (soaked) they are. Record this information.  DO NOT USE TAMPONS. Do not douche, have sexual intercourse or orgasms until approved by your caregiver.  You may receive a follow up appointment for re-evaluation of your pregnancy and a repeat blood test. Re-evaluation often occurs after 2 days and again in 4 to 6 weeks. It is very important that you follow-up in the recommended time period.  If you are Rh negative and the father is Rh positive or you do not know the fathers' blood type, you may receive a shot (Rh immune globulin) to help prevent abnormal antibodies that can develop and affect the baby in any future pregnancies. SEEK IMMEDIATE MEDICAL CARE IF:  You have severe cramps in your stomach, back, or abdomen.  You have a sudden onset of severe pain in the lower part of your abdomen.  You develop chills.  You run an unexplained temperature of 101 F (38.3 C) or higher.  You pass large clots or tissue. Save any tissue for your caregiver to inspect.  Your bleeding increases or you become light-headed, weak, or have fainting episodes.  You have a gush of fluid from your vagina.  You pass out. This could mean you have a tubal (ectopic) pregnancy. Document Released: 05/03/2005 Document Revised: 07/26/2011 Document Reviewed: 12/18/2007 [facility] Patient Information 2014 [place], [place]. Follow up in 4 days for [place]

## 2013-03-16 ENCOUNTER — Telehealth: Payer: Self-pay | Admitting: Adult Health

## 2013-03-16 LAB — PROGESTERONE: Progesterone: 0.4 ng/mL

## 2013-03-16 NOTE — Telephone Encounter (Signed)
Pt informed per Cyril Mourning, NP progesterone 0.4 and QHCG 89.0, MAB. Pt to keep her appt. Pt verbalized understanding.

## 2013-03-19 ENCOUNTER — Ambulatory Visit (INDEPENDENT_AMBULATORY_CARE_PROVIDER_SITE_OTHER): Payer: Medicaid Other

## 2013-03-19 ENCOUNTER — Other Ambulatory Visit: Payer: Self-pay | Admitting: Adult Health

## 2013-03-19 ENCOUNTER — Encounter: Payer: Self-pay | Admitting: Adult Health

## 2013-03-19 ENCOUNTER — Ambulatory Visit (INDEPENDENT_AMBULATORY_CARE_PROVIDER_SITE_OTHER): Payer: Medicaid Other | Admitting: Adult Health

## 2013-03-19 VITALS — BP 140/94 | Ht 66.0 in | Wt 246.0 lb

## 2013-03-19 DIAGNOSIS — O039 Complete or unspecified spontaneous abortion without complication: Secondary | ICD-10-CM

## 2013-03-19 DIAGNOSIS — O200 Threatened abortion: Secondary | ICD-10-CM

## 2013-03-19 DIAGNOSIS — O10019 Pre-existing essential hypertension complicating pregnancy, unspecified trimester: Secondary | ICD-10-CM

## 2013-03-19 DIAGNOSIS — I10 Essential (primary) hypertension: Secondary | ICD-10-CM

## 2013-03-19 DIAGNOSIS — Z309 Encounter for contraceptive management, unspecified: Secondary | ICD-10-CM

## 2013-03-19 HISTORY — DX: Complete or unspecified spontaneous abortion without complication: O03.9

## 2013-03-19 MED ORDER — NORETHINDRONE 0.35 MG PO TABS: 1.0000 | ORAL_TABLET | Freq: Every day | ORAL | Status: DC

## 2013-03-19 MED ORDER — BUTALBITAL-APAP-CAFFEINE 50-500-40 MG PO TABS: 1.0000 | ORAL_TABLET | ORAL | Status: DC | PRN

## 2013-03-19 NOTE — Progress Notes (Signed)
Subjective:     Patient ID: Chelsey Santiago, female   DOB: 1975/12/14, 37 y.o.   MRN: [id number]  HPI Chelsey Santiago is 37 year old black female in for [place], her QHCG has dropped to 89 and progesterone was 0.4, and [place] was normal, no IUP sac or adnexal mass or free fluid and endometrium was normal.Has headaches.  Review of Systems See HPI Reviewed past medical,surgical, social and family history. Reviewed medications and allergies.     Objective:   Physical Exam BP 140/94  Ht 5\' 6"  (1.676 m)  Wt 246 lb (111.585 kg)  BMI 39.72 kg/m2  LMP 01/17/2013  Breastfeeding? No   reviewed [place] and labs, will start Micronor today  Assessment:     Miscarriage Contraceptive management Hypertension Headaches     Plan:     Rx micronor x 1 year   Take BP meds Follow up in 2 weeks Rx Fioricet for headaches

## 2013-03-19 NOTE — Patient Instructions (Signed)
Start Micronor today use condoms Try Fioricet Follow up in 2 weeks

## 2013-03-22 ENCOUNTER — Other Ambulatory Visit: Payer: Self-pay

## 2013-04-02 ENCOUNTER — Ambulatory Visit: Payer: Medicaid Other | Admitting: Adult Health

## 2013-04-03 ENCOUNTER — Ambulatory Visit: Payer: Medicaid Other | Admitting: Adult Health

## 2013-10-16 ENCOUNTER — Other Ambulatory Visit: Payer: Self-pay | Admitting: Obstetrics and Gynecology

## 2013-10-16 DIAGNOSIS — O09529 Supervision of elderly multigravida, unspecified trimester: Secondary | ICD-10-CM

## 2013-10-16 DIAGNOSIS — O3680X0 Pregnancy with inconclusive fetal viability, not applicable or unspecified: Secondary | ICD-10-CM

## 2013-10-18 ENCOUNTER — Other Ambulatory Visit: Payer: Medicaid Other

## 2013-10-18 ENCOUNTER — Ambulatory Visit (INDEPENDENT_AMBULATORY_CARE_PROVIDER_SITE_OTHER): Payer: Medicaid Other

## 2013-10-18 ENCOUNTER — Encounter: Payer: Self-pay | Admitting: Obstetrics & Gynecology

## 2013-10-18 ENCOUNTER — Encounter: Payer: Self-pay | Admitting: *Deleted

## 2013-10-18 ENCOUNTER — Other Ambulatory Visit: Payer: Self-pay | Admitting: Obstetrics and Gynecology

## 2013-10-18 DIAGNOSIS — Z36 Encounter for antenatal screening of mother: Secondary | ICD-10-CM

## 2013-10-18 DIAGNOSIS — O09529 Supervision of elderly multigravida, unspecified trimester: Secondary | ICD-10-CM

## 2013-10-18 DIAGNOSIS — O0990 Supervision of high risk pregnancy, unspecified, unspecified trimester: Secondary | ICD-10-CM

## 2013-10-18 DIAGNOSIS — O3680X0 Pregnancy with inconclusive fetal viability, not applicable or unspecified: Secondary | ICD-10-CM

## 2013-10-18 LAB — CBC
HEMATOCRIT: 30.4 % — AB (ref 36.0–46.0)
Hemoglobin: 9.8 g/dL — ABNORMAL LOW (ref 12.0–15.0)
MCH: 22.4 pg — ABNORMAL LOW (ref 26.0–34.0)
MCHC: 32.2 g/dL (ref 30.0–36.0)
MCV: 69.6 fL — ABNORMAL LOW (ref 78.0–100.0)
Platelets: 233 10*3/uL (ref 150–400)
RBC: 4.37 MIL/uL (ref 3.87–5.11)
RDW: 15.9 % — AB (ref 11.5–15.5)
WBC: 9.1 10*3/uL (ref 4.0–10.5)

## 2013-10-18 LAB — COMPREHENSIVE METABOLIC PANEL
AST: 13 U/L (ref 0–37)
Albumin: 3.3 g/dL — ABNORMAL LOW (ref 3.5–5.2)
Alkaline Phosphatase: 49 U/L (ref 39–117)
BILIRUBIN TOTAL: 0.3 mg/dL (ref 0.2–1.2)
BUN: 8 mg/dL (ref 6–23)
CO2: 23 mEq/L (ref 19–32)
Calcium: 8.7 mg/dL (ref 8.4–10.5)
Chloride: 104 mEq/L (ref 96–112)
Creat: 0.5 mg/dL (ref 0.50–1.10)
Glucose, Bld: 98 mg/dL (ref 70–99)
Potassium: 4 mEq/L (ref 3.5–5.3)
SODIUM: 136 meq/L (ref 135–145)
TOTAL PROTEIN: 6.4 g/dL (ref 6.0–8.3)

## 2013-10-18 NOTE — Progress Notes (Signed)
Patient ID: Chelsey Santiago, female   DOB: 1976/02/20, 38 y.o.   MRN: [id number] When Pt's BP was rechecked it was 174/116.

## 2013-10-18 NOTE — Progress Notes (Signed)
Patient ID: Chelsey Santiago, female   DOB: [birth year], 38 y.o.   MRN: [id number] I spoke with Jake Shark and she advised that the pt should go to the ED for immediate care of her elevated BP. I again advised the pt to go to the ED for evaluation and the pt refused. Manus Gunning also stated that the pt could possibly get financial assistance with her BP medication if she went to the ED, the pt still refused. Manus Gunning advised that she needed to get back on her medication and asked why the pt had stopped taking it in the first place, the pt stated, "I lost my medicaid." Manus Gunning then advised these are the Pt's options she go to the ED and get evaluated, call [facility] and see if they will refill her medication, or come back this afternoon and see one of [place],. I again advised the pt of the above and she stated that she would just come back this afternoon. The pt stated " if the Rx doesn't cost a lot I can get it, but if it costs a lot I can't."  The pt was advised multiple times that she needed to go the ED for evaluation and the Pt repeatedly refused to go. The Pt stated " I'm feeling fine and I don't want to have to sit over there in the ER for 2-3 hours".  After multiple attempts to get the Pt to go to the ED I made the pt an appointment for first thing this afternoon to see one of the providers.

## 2013-10-18 NOTE — Progress Notes (Signed)
Patient ID: Chelsey Santiago, female   DOB: 11/03/1975, 38 y.o.   MRN: [id number] Pt is here today for an [place] and then lab work. Pt asked for her BP to be checked. Pt is a known hypertensive and has been off her medication for a few months, she states that she has headaches but no blurred vision. Pt's BP was checked and it was 164/120. We have no providers here this morning so I advised the pt to go to the ED for evaluation. Pt states that " I am going to go and get me something to eat and then go home." I advised the pt again that she should go to the ED for evaluation. I will recheck BP.  All providers are in a meeting at [facility] will attempt to contact one of them.

## 2013-10-18 NOTE — Progress Notes (Signed)
U/S(10+6wks)-single active fetus, posterior/fundal Gr 0 placenta, cx appears closed, bilateral adnexa appears WNL, multiple fibroids noted(largest noted anterior lower uterine wall=6.4 x 6.3cm), CRL c/w LMP dates, pt desires NT/IT screen, NB-present, NT-1.69mm, FHR-173 bpm

## 2013-10-18 NOTE — Progress Notes (Signed)
Patient ID: Chelsey Santiago, female   DOB: [birth year], 38 y.o.   MRN: [id number] Pt was supposed to return to our office this afternoon but no showed the appointment. The pt was called and a message was left for her to return the call.

## 2013-10-18 NOTE — Addendum Note (Signed)
Addended by: Farley Ly on: 10/18/2013 11:16 AM   Modules accepted: Orders

## 2013-10-19 LAB — RPR

## 2013-10-19 LAB — HEPATITIS B SURFACE ANTIGEN: HEP B S AG: NEGATIVE

## 2013-10-19 LAB — RUBELLA SCREEN: Rubella: 5.46 Index — ABNORMAL HIGH (ref <0.90)

## 2013-10-19 LAB — TSH

## 2013-10-19 LAB — VARICELLA ZOSTER ANTIBODY, IGG: Varicella IgG: 685.7 Index — ABNORMAL HIGH (ref <135.00)

## 2013-10-19 LAB — HIV ANTIBODY (ROUTINE TESTING W REFLEX): HIV 1&2 Ab, 4th Generation: NONREACTIVE

## 2013-10-19 LAB — ABO AND RH: Rh Type: NEGATIVE

## 2013-10-19 LAB — ANTIBODY SCREEN: Antibody Screen: NEGATIVE

## 2013-10-21 ENCOUNTER — Encounter (HOSPITAL_COMMUNITY): Payer: Self-pay | Admitting: Emergency Medicine

## 2013-10-21 ENCOUNTER — Emergency Department (HOSPITAL_COMMUNITY)
Admission: EM | Admit: 2013-10-21 | Discharge: 2013-10-21 | Disposition: A | Discharge status: Home / Self Care | Payer: Medicaid Other | Attending: Emergency Medicine | Admitting: Emergency Medicine

## 2013-10-21 DIAGNOSIS — O99210 Obesity complicating pregnancy, unspecified trimester: Secondary | ICD-10-CM

## 2013-10-21 DIAGNOSIS — O9989 Other specified diseases and conditions complicating pregnancy, childbirth and the puerperium: Secondary | ICD-10-CM | POA: Insufficient documentation

## 2013-10-21 DIAGNOSIS — E119 Type 2 diabetes mellitus without complications: Secondary | ICD-10-CM | POA: Insufficient documentation

## 2013-10-21 DIAGNOSIS — R0600 Dyspnea, unspecified: Secondary | ICD-10-CM

## 2013-10-21 DIAGNOSIS — F341 Dysthymic disorder: Secondary | ICD-10-CM | POA: Insufficient documentation

## 2013-10-21 DIAGNOSIS — O99340 Other mental disorders complicating pregnancy, unspecified trimester: Secondary | ICD-10-CM | POA: Insufficient documentation

## 2013-10-21 DIAGNOSIS — E669 Obesity, unspecified: Secondary | ICD-10-CM | POA: Insufficient documentation

## 2013-10-21 DIAGNOSIS — D649 Anemia, unspecified: Secondary | ICD-10-CM | POA: Insufficient documentation

## 2013-10-21 DIAGNOSIS — R0602 Shortness of breath: Secondary | ICD-10-CM | POA: Insufficient documentation

## 2013-10-21 DIAGNOSIS — O99019 Anemia complicating pregnancy, unspecified trimester: Secondary | ICD-10-CM | POA: Insufficient documentation

## 2013-10-21 DIAGNOSIS — Z3490 Encounter for supervision of normal pregnancy, unspecified, unspecified trimester: Secondary | ICD-10-CM

## 2013-10-21 DIAGNOSIS — I110 Hypertensive heart disease with heart failure: Secondary | ICD-10-CM | POA: Insufficient documentation

## 2013-10-21 DIAGNOSIS — O10919 Unspecified pre-existing hypertension complicating pregnancy, unspecified trimester: Secondary | ICD-10-CM | POA: Insufficient documentation

## 2013-10-21 DIAGNOSIS — Z79899 Other long term (current) drug therapy: Secondary | ICD-10-CM | POA: Insufficient documentation

## 2013-10-21 DIAGNOSIS — Z8709 Personal history of other diseases of the respiratory system: Secondary | ICD-10-CM | POA: Insufficient documentation

## 2013-10-21 DIAGNOSIS — I10 Essential (primary) hypertension: Secondary | ICD-10-CM

## 2013-10-21 DIAGNOSIS — G43909 Migraine, unspecified, not intractable, without status migrainosus: Secondary | ICD-10-CM | POA: Insufficient documentation

## 2013-10-21 DIAGNOSIS — O24919 Unspecified diabetes mellitus in pregnancy, unspecified trimester: Secondary | ICD-10-CM | POA: Insufficient documentation

## 2013-10-21 DIAGNOSIS — I509 Heart failure, unspecified: Secondary | ICD-10-CM | POA: Insufficient documentation

## 2013-10-21 DIAGNOSIS — Z8739 Personal history of other diseases of the musculoskeletal system and connective tissue: Secondary | ICD-10-CM | POA: Insufficient documentation

## 2013-10-21 LAB — CBC WITH DIFFERENTIAL/PLATELET
Basophils Absolute: 0 10*3/uL (ref 0.0–0.1)
Basophils Relative: 0 % (ref 0–1)
EOS PCT: 1 % (ref 0–5)
Eosinophils Absolute: 0.1 10*3/uL (ref 0.0–0.7)
HEMATOCRIT: 29.4 % — AB (ref 36.0–46.0)
Hemoglobin: 9.5 g/dL — ABNORMAL LOW (ref 12.0–15.0)
LYMPHS ABS: 1.6 10*3/uL (ref 0.7–4.0)
Lymphocytes Relative: 20 % (ref 12–46)
MCH: 23.1 pg — ABNORMAL LOW (ref 26.0–34.0)
MCHC: 32.3 g/dL (ref 30.0–36.0)
MCV: 71.4 fL — AB (ref 78.0–100.0)
MONOS PCT: 6 % (ref 3–12)
Monocytes Absolute: 0.5 10*3/uL (ref 0.1–1.0)
NEUTROS ABS: 5.8 10*3/uL (ref 1.7–7.7)
Neutrophils Relative %: 73 % (ref 43–77)
Platelets: 197 10*3/uL (ref 150–400)
RBC: 4.12 MIL/uL (ref 3.87–5.11)
RDW: 14.9 % (ref 11.5–15.5)
WBC: 8 10*3/uL (ref 4.0–10.5)

## 2013-10-21 LAB — BASIC METABOLIC PANEL
BUN: 9 mg/dL (ref 6–23)
CO2: 23 mEq/L (ref 19–32)
Calcium: 8.8 mg/dL (ref 8.4–10.5)
Chloride: 100 mEq/L (ref 96–112)
Creatinine, Ser: 0.51 mg/dL (ref 0.50–1.10)
GFR calc Af Amer: >90 mL/min (ref >90)
GFR calc non Af Amer: >90 mL/min (ref >90)
GLUCOSE: 118 mg/dL — AB (ref 70–99)
POTASSIUM: 3.8 meq/L (ref 3.7–5.3)
Sodium: 136 mEq/L — ABNORMAL LOW (ref 137–147)

## 2013-10-21 MED ORDER — LABETALOL HCL 200 MG PO TABS
200.0000 mg | ORAL_TABLET | Freq: Once | ORAL | Status: AC
  Administered 2013-10-21: 200 mg via ORAL
  Filled 2013-10-21: qty 1

## 2013-10-21 MED ORDER — ALBUTEROL SULFATE HFA 108 (90 BASE) MCG/ACT IN AERS
2.0000 | INHALATION_SPRAY | RESPIRATORY_TRACT | Status: DC | PRN
  Administered 2013-10-21: 2 via RESPIRATORY_TRACT
  Filled 2013-10-21: qty 6.7

## 2013-10-21 MED ORDER — LABETALOL HCL 200 MG PO TABS: 400.0000 mg | ORAL_TABLET | Freq: Two times a day (BID) | ORAL | Status: DC

## 2013-10-21 MED ORDER — NIFEDIPINE ER OSMOTIC RELEASE 90 MG PO TB24: 90.0000 mg | ORAL_TABLET | Freq: Every day | ORAL | Status: DC

## 2013-10-21 MED ORDER — HYDROCHLOROTHIAZIDE 25 MG PO TABS
25.0000 mg | ORAL_TABLET | Freq: Every day | ORAL | Status: DC
  Administered 2013-10-21: 25 mg via ORAL
  Filled 2013-10-21 (×2): qty 1

## 2013-10-21 MED ORDER — NIFEDIPINE ER OSMOTIC RELEASE 30 MG PO TB24
90.0000 mg | ORAL_TABLET | Freq: Once | ORAL | Status: AC
  Administered 2013-10-21: 90 mg via ORAL
  Filled 2013-10-21: qty 3

## 2013-10-21 MED ORDER — HYDROCHLOROTHIAZIDE 25 MG PO TABS: 25.0000 mg | ORAL_TABLET | Freq: Every day | ORAL | Status: DC

## 2013-10-21 MED ORDER — IPRATROPIUM-ALBUTEROL 0.5-2.5 (3) MG/3ML IN SOLN
3.0000 mL | Freq: Once | RESPIRATORY_TRACT | Status: AC
  Administered 2013-10-21: 3 mL via RESPIRATORY_TRACT
  Filled 2013-10-21: qty 3

## 2013-10-21 NOTE — ED Notes (Signed)
Pt alert & oriented x4, stable gait. Patient given discharge instructions, paperwork & prescription(s). Patient  instructed to stop at the registration desk to finish any additional paperwork. Patient verbalized understanding. Pt left department w/ no further questions. 

## 2013-10-21 NOTE — ED Provider Notes (Signed)
CSN: [id number]     Arrival date & time 10/21/13  0630 History   First MD Initiated Contact with Patient 10/21/13 [phone]     Chief Complaint  Patient presents with  . Shortness of Breath     (Consider location/radiation/quality/duration/timing/severity/associated sxs/prior Treatment) Patient is a 38 y.o. female presenting with shortness of breath. The history is provided by the patient.  Shortness of Breath She woke up this morning with sweating and noted that she was short of breath. She's had a harsh cough which was nonproductive. However, she vomited a large amount of thick, white material. She was not aware of fever or chills. She denies arthralgias or myalgias. She is [redacted] weeks pregnant. She does have history of hypertension states that she's been off of her medications for about 2-3 months because she is between insurances. She did not try any treatment for her cough or dyspnea or sweating at home. She states that she was fine all day yesterday.  Past Medical History  Diagnosis Date  . Hypertension     Lab: Normal BMet except glucose of 118 in 09/2010  . Hypertensive heart disease 2009    Pulmonary edema postpartum; mild to moderate mitral regurgitation when hospitalized for CHF in 2009; Echocardiogram in 12/2009-no MR and normal EF; normal CXR in 09/2010  . Migraine headache   . Anemia     H&H of 10.6/33 and 07/2008 and 11.9/35 and 09/2010  . Osteoarthritis, knee 03/29/2011  . Sleep apnea   . Depression with anxiety   . Fasting hyperglycemia   . Obesity 04/16/2009  . CHF (congestive heart failure)   . Pregnant   . Diabetes mellitus without complication   . Anxiety   . Enlarged heart   . Pulmonary edema   . Preeclampsia   . Threatened abortion in early pregnancy 03/15/2013  . Miscarriage 03/19/2013   Past Surgical History  Procedure Laterality Date  . Breast reduction surgery  2002   Family History  Problem Relation Age of Onset  . Diabetes Mother   . Heart disease Mother   .  Hypertension Maternal Uncle   . Hyperlipidemia Paternal Grandfather   . Hypertension Paternal Grandfather   . Sudden death Neg Hx   . Heart attack Neg Hx    History  Substance Use Topics  . Smoking status: Never Smoker   . Smokeless tobacco: Never Used  . Alcohol Use: No     Comment: occ   OB History   Grav Para Term Preterm Abortions TAB SAB Ect Mult Living   11 5 5  5 2 3   5      Review of Systems  Respiratory: Positive for shortness of breath.   All other systems reviewed and are negative.     Allergies  Diclofenac; Tramadol; and Vicodin  Home Medications   Prior to Admission medications   Medication Sig Start Date End Date Taking? Authorizing Provider  butalbital-acetaminophen-caffeine (ESGIC PLUS) 50-500-40 MG per tablet Take 1 tablet by mouth every 4 (four) hours as needed for pain. 03/19/13   Estill Dooms, NP  ferrous sulfate (FERROUSUL) 325 (65 FE) MG tablet Take 1 tablet (325 mg total) by mouth 3 (three) times daily with meals. 10/12/12   Martha Clan, MD  furosemide (LASIX) 20 MG tablet Take 20 mg by mouth daily.    Historical Provider, MD  hydrochlorothiazide (HYDRODIURIL) 25 MG tablet Take 1 tablet (25 mg total) by mouth daily. 10/12/12   Martha Clan, MD  labetalol (NORMODYNE) 200  MG tablet Take 400 mg by mouth 2 (two) times daily. 10/12/12   Martha Clan, MD  NIFEdipine (PROCARDIA XL/ADALAT-CC) 90 MG 24 hr tablet Take 90 mg by mouth daily.    Historical Provider, MD  norethindrone (MICRONOR,CAMILA,ERRIN) 0.35 MG tablet Take 1 tablet (0.35 mg total) by mouth daily. 03/19/13   Estill Dooms, NP  omeprazole (PRILOSEC) 20 MG capsule 1 tablet twice a day 10/18/12   Florian Buff, MD  venlafaxine XR (EFFEXOR-XR) 150 MG 24 hr capsule Take 150 mg by mouth daily.    Historical Provider, MD   BP 176/116  Pulse 116  Temp(Src) 97.7 F (36.5 C) (Oral)  Resp 22  Ht 5\' 6"  (1.676 m)  Wt 246 lb (111.585 kg)  BMI 39.72 kg/m2  SpO2 99%  LMP 08/03/2013 Physical Exam   Nursing note and vitals reviewed.  38 year old female, resting comfortably and in no acute distress. Vital signs are significant for tachycardia with heart rate 116, tachypnea with respiratory rate of 22, and hypertension with blood pressure 176/116. She is afebrile. Oxygen saturation is 99%, which is normal. Head is normocephalic and atraumatic. PERRLA, EOMI. Oropharynx is clear. Neck is nontender and supple without adenopathy or JVD. Back is nontender and there is no CVA tenderness. Lungs have a slightly prolonged exhalation phase with faint expiratory wheezes, but no rales or rhonchi. Chest is nontender. Heart has regular rate and rhythm without murmur. Abdomen is soft, flat, nontender without masses or hepatosplenomegaly and peristalsis is normoactive. Extremities have no cyanosis or edema, full range of motion is present. Skin is warm and dry without rash. Neurologic: Mental status is normal, cranial nerves are intact, there are no motor or sensory deficits.  ED Course  Procedures (including critical care time) Labs Review Results for orders placed during the hospital encounter of 10/21/13  CBC WITH DIFFERENTIAL      Result Value Ref Range   WBC 8.0  4.0 - 10.5 K/uL   RBC 4.12  3.87 - 5.11 MIL/uL   Hemoglobin 9.5 (*) 12.0 - 15.0 g/dL   HCT 29.4 (*) 36.0 - 46.0 %   MCV 71.4 (*) 78.0 - 100.0 fL   MCH 23.1 (*) 26.0 - 34.0 pg   MCHC 32.3  30.0 - 36.0 g/dL   RDW 14.9  11.5 - 15.5 %   Platelets 197  150 - 400 K/uL   Neutrophils Relative % 73  43 - 77 %   Lymphocytes Relative 20  12 - 46 %   Monocytes Relative 6  3 - 12 %   Eosinophils Relative 1  0 - 5 %   Basophils Relative 0  0 - 1 %   Neutro Abs 5.8  1.7 - 7.7 K/uL   Lymphs Abs 1.6  0.7 - 4.0 K/uL   Monocytes Absolute 0.5  0.1 - 1.0 K/uL   Eosinophils Absolute 0.1  0.0 - 0.7 K/uL   Basophils Absolute 0.0  0.0 - 0.1 K/uL   RBC Morphology POLYCHROMASIA PRESENT     Smear Review LARGE PLATELETS PRESENT    BASIC METABOLIC  PANEL      Result Value Ref Range   Sodium 136 (*) 137 - 147 mEq/L   Potassium 3.8  3.7 - 5.3 mEq/L   Chloride 100  96 - 112 mEq/L   CO2 23  19 - 32 mEq/L   Glucose, Bld 118 (*) 70 - 99 mg/dL   BUN 9  6 - 23 mg/dL   Creatinine, Ser  0.51  0.50 - 1.10 mg/dL   Calcium 8.8  8.4 - 10.5 mg/dL   GFR calc non Af Amer >90  >90 mL/min   GFR calc Af Amer >90  >90 mL/min    EKG Interpretation   Date/Time:  Sunday October 21 2013 06:46:50 EDT Ventricular Rate:  107 PR Interval:  147 QRS Duration: 91 QT Interval:  383 QTC Calculation: 511 R Axis:   43 Text Interpretation:  Sinus tachycardia Consider left ventricular  hypertrophy Prolonged QT interval When compared with ECG of 12/23/2011, No  significant change was found Confirmed by [facility]  MD, Kaneshia Cater ([id number]) on  10/21/2013 6:53:55 AM      MDM   Final diagnoses:  Dyspnea  Hypertension  Pregnancy    Cough with sweats which likely represents a respiratory infection. Dyspnea could be secondary to same and evidence of bronchospasm would be secondary. She will be given albuterol with ipratropium and reassessed. She will also need new prescriptions for her antihypertensive medications. She states that she has applied for Medicaid and this is waiting for the card to be issued.  She feels significantly better after albuterol with ipratropium. Anemia is noted but unchanged from baseline. Remainder of laboratory tests were unremarkable. She is given an inhaler to take home. Initial dose of her antihypertensives as given in the ED and she is given prescriptions for 1 month supply of all of them.  Delora Fuel, MD 75/88/32 [id number]

## 2013-10-21 NOTE — Discharge Instructions (Signed)
Use the inhaler as needed for difficulty breathing or coughing. Make sure to get your prescriptions filled for your blood pressure medications and monitor your blood pressure at home. He will need to follow up with your gynecologist and your primary care physician.   Arterial Hypertension Arterial hypertension (high blood pressure) is a condition of elevated pressure in your blood vessels. Hypertension over a long period of time is a risk factor for strokes, heart attacks, and heart failure. It is also the leading cause of kidney (renal) failure.  CAUSES   In Adults -- Over 90% of all hypertension has no known cause. This is called essential or primary hypertension. In the other 10% of people with hypertension, the increase in blood pressure is caused by another disorder. This is called secondary hypertension. Important causes of secondary hypertension are:  Heavy alcohol use.  Obstructive sleep apnea.  Hyperaldosterosim (Conn's syndrome).  Steroid use.  Chronic kidney failure.  Hyperparathyroidism.  Medications.  Renal artery stenosis.  Pheochromocytoma.  Cushing's disease.  Coarctation of the aorta.  Scleroderma renal crisis.  Licorice (in excessive amounts).  Drugs (cocaine, methamphetamine). Your caregiver can explain any items above that apply to you.  In Children -- Secondary hypertension is more common and should always be considered.  Pregnancy -- Few women of childbearing age have high blood pressure. However, up to 10% of them develop hypertension of pregnancy. Generally, this will not harm the woman. It may be a sign of 3 complications of pregnancy: preeclampsia, HELLP syndrome, and eclampsia. Follow up and control with medication is necessary. SYMPTOMS   This condition normally does not produce any noticeable symptoms. It is usually found during a routine exam.  Malignant hypertension is a late problem of high blood pressure. It may have the following  symptoms:  Headaches.  Blurred vision.  End-organ damage (this means your kidneys, heart, lungs, and other organs are being damaged).  Stressful situations can increase the blood pressure. If a person with normal blood pressure has their blood pressure go up while being seen by their caregiver, this is often termed "white coat hypertension." Its importance is not known. It may be related with eventually developing hypertension or complications of hypertension.  Hypertension is often confused with mental tension, stress, and anxiety. DIAGNOSIS  The diagnosis is made by 3 separate blood pressure measurements. They are taken at least 1 week apart from each other. If there is organ damage from hypertension, the diagnosis may be made without repeat measurements. Hypertension is usually identified by having blood pressure readings:  Above 140/90 mmHg measured in both arms, at 3 separate times, over a couple weeks.  Over 130/80 mmHg should be considered a risk factor and may require treatment in patients with diabetes. Blood pressure readings over 120/80 mmHg are called "pre-hypertension" even in non-diabetic patients. To get a true blood pressure measurement, use the following guidelines. Be aware of the factors that can alter blood pressure readings.  Take measurements at least 1 hour after caffeine.  Take measurements 30 minutes after smoking and without any stress. This is another reason to quit smoking  it raises your blood pressure.  Use a proper cuff size. Ask your caregiver if you are not sure about your cuff size.  Most home blood pressure cuffs are automatic. They will measure systolic and diastolic pressures. The systolic pressure is the pressure reading at the start of sounds. Diastolic pressure is the pressure at which the sounds disappear. If you are elderly, measure pressures in  multiple postures. Try sitting, lying or standing.  Sit at rest for a minimum of 5 minutes before  taking measurements.  You should not be on any medications like decongestants. These are found in many cold medications.  Record your blood pressure readings and review them with your caregiver. If you have hypertension:  Your caregiver may do tests to be sure you do not have secondary hypertension (see "causes" above).  Your caregiver may also look for signs of metabolic syndrome. This is also called Syndrome X or Insulin Resistance Syndrome. You may have this syndrome if you have type 2 diabetes, abdominal obesity, and abnormal blood lipids in addition to hypertension.  Your caregiver will take your medical and family history and perform a physical exam.  Diagnostic tests may include blood tests (for glucose, cholesterol, potassium, and kidney function), a urinalysis, or an EKG. Other tests may also be necessary depending on your condition. PREVENTION  There are important lifestyle issues that you can adopt to reduce your chance of developing hypertension:  Maintain a normal weight.  Limit the amount of salt (sodium) in your diet.  Exercise often.  Limit alcohol intake.  Get enough potassium in your diet. Discuss specific advice with your caregiver.  Follow a DASH diet (dietary approaches to stop hypertension). This diet is rich in fruits, vegetables, and low-fat dairy products, and avoids certain fats. PROGNOSIS  Essential hypertension cannot be cured. Lifestyle changes and medical treatment can lower blood pressure and reduce complications. The prognosis of secondary hypertension depends on the underlying cause. Many people whose hypertension is controlled with medicine or lifestyle changes can live a normal, healthy life.  RISKS AND COMPLICATIONS  While high blood pressure alone is not an illness, it often requires treatment due to its short- and long-term effects on many organs. Hypertension increases your risk for:  CVAs or strokes (cerebrovascular accident).  Heart failure  due to chronically high blood pressure (hypertensive cardiomyopathy).  Heart attack (myocardial infarction).  Damage to the retina (hypertensive retinopathy).  Kidney failure (hypertensive nephropathy). Your caregiver can explain list items above that apply to you. Treatment of hypertension can significantly reduce the risk of complications. TREATMENT   For overweight patients, weight loss and regular exercise are recommended. Physical fitness lowers blood pressure.  Mild hypertension is usually treated with diet and exercise. A diet rich in fruits and vegetables, fat-free dairy products, and foods low in fat and salt (sodium) can help lower blood pressure. Decreasing salt intake decreases blood pressure in a 1/3 of people.  Stop smoking if you are a smoker. The steps above are highly effective in reducing blood pressure. While these actions are easy to suggest, they are difficult to achieve. Most patients with moderate or severe hypertension end up requiring medications to bring their blood pressure down to a normal level. There are several classes of medications for treatment. Blood pressure pills (antihypertensives) will lower blood pressure by their different actions. Lowering the blood pressure by 10 mmHg may decrease the risk of complications by as much as 25%. The goal of treatment is effective blood pressure control. This will reduce your risk for complications. Your caregiver will help you determine the best treatment for you according to your lifestyle. What is excellent treatment for one person, may not be for you. HOME CARE INSTRUCTIONS   Do not smoke.  Follow the lifestyle changes outlined in the "Prevention" section.  If you are on medications, follow the directions carefully. Blood pressure medications must be taken as  prescribed. Skipping doses reduces their benefit. It also puts you at risk for problems.  Follow up with your caregiver, as directed.  If you are asked to  monitor your blood pressure at home, follow the guidelines in the "Diagnosis" section above. SEEK MEDICAL CARE IF:   You think you are having medication side effects.  You have recurrent headaches or lightheadedness.  You have swelling in your ankles.  You have trouble with your vision. SEEK IMMEDIATE MEDICAL CARE IF:   You have sudden onset of chest pain or pressure, difficulty breathing, or other symptoms of a heart attack.  You have a severe headache.  You have symptoms of a stroke (such as sudden weakness, difficulty speaking, difficulty walking). MAKE SURE YOU:   Understand these instructions.  Will watch your condition.  Will get help right away if you are not doing well or get worse. Document Released: 05/03/2005 Document Revised: 07/26/2011 Document Reviewed: 12/01/2006 [facility] Patient Information 2014 [place].  Albuterol inhalation aerosol What is this medicine? ALBUTEROL (al Normajean [place]) is a bronchodilator. It helps open up the airways in your lungs to make it easier to breathe. This medicine is used to treat and to prevent bronchospasm. This medicine may be used for other purposes; ask your health care provider or pharmacist if you have questions. COMMON BRAND NAME(S): Proair HFA, Proventil HFA, Proventil, Respirol , Ventolin HFA, Ventolin What should I tell my health care provider before I take this medicine? They need to know if you have any of the following conditions: -diabetes -heart disease or irregular heartbeat -high blood pressure -pheochromocytoma -seizures -thyroid disease -an unusual or allergic reaction to albuterol, levalbuterol, sulfites, other medicines, foods, dyes, or preservatives -pregnant or trying to get pregnant -breast-feeding How should I use this medicine? This medicine is for inhalation through the mouth. Follow the directions on your prescription label. Take your medicine at regular intervals. Do not use more often than  directed. Make sure that you are using your inhaler correctly. Ask you doctor or health care provider if you have any questions. Talk to your pediatrician regarding the use of this medicine in children. Special care may be needed. Overdosage: If you think you have taken too much of this medicine contact a poison control center or emergency room at once. NOTE: This medicine is only for you. Do not share this medicine with others. What if I miss a dose? If you miss a dose, use it as soon as you can. If it is almost time for your next dose, use only that dose. Do not use double or extra doses. What may interact with this medicine? -anti-infectives like chloroquine and pentamidine -caffeine -cisapride -diuretics -medicines for colds -medicines for depression or for emotional or psychotic conditions -medicines for weight loss including some herbal products -methadone -some antibiotics like clarithromycin, erythromycin, levofloxacin, and linezolid -some heart medicines -steroid hormones like dexamethasone, cortisone, hydrocortisone -theophylline -thyroid hormones This list may not describe all possible interactions. Give your health care provider a list of all the medicines, herbs, non-prescription drugs, or dietary supplements you use. Also tell them if you smoke, drink alcohol, or use illegal drugs. Some items may interact with your medicine. What should I watch for while using this medicine? Tell your doctor or health care professional if your symptoms do not improve. Do not use extra albuterol. If your asthma or bronchitis gets worse while you are using this medicine, call your doctor right away. If your mouth gets dry try chewing  sugarless gum or sucking hard candy. Drink water as directed. What side effects may I notice from receiving this medicine? Side effects that you should report to your doctor or health care professional as soon as possible: -allergic reactions like skin rash, itching  or hives, swelling of the face, lips, or tongue -breathing problems -chest pain -feeling faint or lightheaded, falls -high blood pressure -irregular heartbeat -fever -muscle cramps or weakness -pain, tingling, numbness in the hands or feet -vomiting Side effects that usually do not require medical attention (report to your doctor or health care professional if they continue or are bothersome): -cough -difficulty sleeping -headache -nervousness or trembling -stomach upset -stuffy or runny nose -throat irritation -unusual taste This list may not describe all possible side effects. Call your doctor for medical advice about side effects. You may report side effects to FDA at 1-800-FDA-1088. Where should I keep my medicine? Keep out of the reach of children. Store at room temperature between 15 and 30 degrees C (59 and 86 degrees F). The contents are under pressure and may burst when exposed to heat or flame. Do not freeze. This medicine does not work as well if it is too cold. Throw away any unused medicine after the expiration date. Inhalers need to be thrown away after the labeled number of puffs have been used or by the expiration date; whichever comes first. Ventolin HFA should be thrown away 12 months after removing from foil pouch. Check the instructions that come with your medicine. NOTE: This sheet is a summary. It may not cover all possible information. If you have questions about this medicine, talk to your doctor, pharmacist, or health care provider.  2014, [employer]. (2012-10-19 10:57:17)

## 2013-10-21 NOTE — ED Notes (Signed)
Patient states she woke up this morning with shortness of breath and sweating.  States went to bathroom and vomiting thick white sputum.  Patient is [redacted] weeks pregnant.

## 2013-10-23 LAB — MATERNAL SCREEN, INTEGRATED #1

## 2013-11-09 ENCOUNTER — Ambulatory Visit (INDEPENDENT_AMBULATORY_CARE_PROVIDER_SITE_OTHER): Payer: Medicaid Other | Admitting: Cardiology

## 2013-11-09 ENCOUNTER — Encounter: Payer: Self-pay | Admitting: Cardiology

## 2013-11-09 VITALS — BP 175/126 | HR 103 | Ht 66.0 in | Wt 257.0 lb

## 2013-11-09 DIAGNOSIS — I10 Essential (primary) hypertension: Secondary | ICD-10-CM

## 2013-11-09 MED ORDER — POTASSIUM CHLORIDE CRYS ER 20 MEQ PO TBCR: 20.0000 meq | EXTENDED_RELEASE_TABLET | ORAL | Status: DC | PRN

## 2013-11-09 MED ORDER — LABETALOL HCL 200 MG PO TABS: 200.0000 mg | ORAL_TABLET | Freq: Two times a day (BID) | ORAL | Status: DC

## 2013-11-09 MED ORDER — NIFEDIPINE ER 30 MG PO TB24
30.0000 mg | ORAL_TABLET | Freq: Every day | ORAL | Status: DC
Start: 2013-11-09 — End: 2014-04-15

## 2013-11-09 NOTE — Patient Instructions (Signed)
   Decrease Labetalol to 200mg  twice a day   Decrease Procardia XL to 30mg  daily - new sent to pharm  Continue HCTZ as needed  Add Potassium 71meq as needed - take on days that you take HCTZ - new sent to pharm Continue all other medications.   Follow up in 2 weeks

## 2013-11-09 NOTE — Progress Notes (Signed)
Clinical Summary [name] is a 38 y.o.female last seen by NP Purcell Nails, this is our first visit together.  1. HTN - she is [redacted] weeks pregnant - recent ER visit 10/21/13 with SOB, had been off bp meds 2-3 months due to insurance issues.  - BP in ER 176/116, heart rate 116.  - restarted her labetalol and procardia at previous doses after ER visit, felt jittery and fatigued on those meds and has since stopped taking.  - of note she tolerated this regimen previously during a prior pregnancy, but doses were increased slowly over time.      Past Medical History  Diagnosis Date  . Hypertension     Lab: Normal BMet except glucose of 118 in 09/2010  . Hypertensive heart disease 2009    Pulmonary edema postpartum; mild to moderate mitral regurgitation when hospitalized for CHF in 2009; Echocardiogram in 12/2009-no MR and normal EF; normal CXR in 09/2010  . Migraine headache   . Anemia     H&H of 10.6/33 and 07/2008 and 11.9/35 and 09/2010  . Osteoarthritis, knee 03/29/2011  . Sleep apnea   . Depression with anxiety   . Fasting hyperglycemia   . Obesity 04/16/2009  . CHF (congestive heart failure)   . Pregnant   . Diabetes mellitus without complication   . Anxiety   . Enlarged heart   . Pulmonary edema   . Preeclampsia   . Threatened abortion in early pregnancy 03/15/2013  . Miscarriage 03/19/2013     Allergies  Allergen Reactions  . Diclofenac     Edema  . Tramadol Nausea And Vomiting  . Vicodin [Hydrocodone-Acetaminophen] Nausea Only     Current Outpatient Prescriptions  Medication Sig Dispense Refill  . butalbital-acetaminophen-caffeine (ESGIC PLUS) 50-500-40 MG per tablet Take 1 tablet by mouth every 4 (four) hours as needed for pain.  30 tablet  0  . ferrous sulfate (FERROUSUL) 325 (65 FE) MG tablet Take 1 tablet (325 mg total) by mouth 3 (three) times daily with meals.  90 tablet  3  . furosemide (LASIX) 20 MG tablet Take 20 mg by mouth daily.      .  hydrochlorothiazide (HYDRODIURIL) 25 MG tablet Take 1 tablet (25 mg total) by mouth daily.  30 tablet  0  . hydrochlorothiazide (HYDRODIURIL) 25 MG tablet Take 1 tablet (25 mg total) by mouth daily.  30 tablet  0  . labetalol (NORMODYNE) 200 MG tablet Take 400 mg by mouth 2 (two) times daily.      Marland Kitchen labetalol (NORMODYNE) 200 MG tablet Take 2 tablets (400 mg total) by mouth 2 (two) times daily.  120 tablet  0  . NIFEdipine (PROCARDIA XL/ADALAT-CC) 90 MG 24 hr tablet Take 90 mg by mouth daily.      Marland Kitchen NIFEdipine (PROCARDIA XL/ADALAT-CC) 90 MG 24 hr tablet Take 1 tablet (90 mg total) by mouth daily.  30 tablet  0  . norethindrone (MICRONOR,CAMILA,ERRIN) 0.35 MG tablet Take 1 tablet (0.35 mg total) by mouth daily.  1 Package  11  . omeprazole (PRILOSEC) 20 MG capsule 1 tablet twice a day  50 capsule  6  . venlafaxine XR (EFFEXOR-XR) 150 MG 24 hr capsule Take 150 mg by mouth daily.       No current facility-administered medications for this visit.     Past Surgical History  Procedure Laterality Date  . Breast reduction surgery  2002     Allergies  Allergen Reactions  . Diclofenac  Edema  . Tramadol Nausea And Vomiting  . Vicodin [Hydrocodone-Acetaminophen] Nausea Only      Family History  Problem Relation Age of Onset  . Diabetes Mother   . Heart disease Mother   . Hypertension Maternal Uncle   . Hyperlipidemia Paternal Grandfather   . Hypertension Paternal Grandfather   . Sudden death Neg Hx   . Heart attack Neg Hx      Social History [name] reports that she has never smoked. She has never used smokeless tobacco. [name] reports that she does not drink alcohol.   Review of Systems CONSTITUTIONAL: No weight loss, fever, chills, weakness or fatigue.  HEENT: Eyes: No visual loss, blurred vision, double vision or yellow sclerae.No hearing loss, sneezing, congestion, runny nose or sore throat.  SKIN: No rash or itching.  CARDIOVASCULAR: no chest pain, SOB, no  palpitations RESPIRATORY: No shortness of breath, cough or sputum.  GASTROINTESTINAL: No anorexia, nausea, vomiting or diarrhea. No abdominal pain or blood.  GENITOURINARY: No burning on urination, no polyuria NEUROLOGICAL: No headache, dizziness, syncope, paralysis, ataxia, numbness or tingling in the extremities. No change in bowel or bladder control.  MUSCULOSKELETAL: No muscle, back pain, joint pain or stiffness.  LYMPHATICS: No enlarged nodes. No history of splenectomy.  PSYCHIATRIC: No history of depression or anxiety.  ENDOCRINOLOGIC: No reports of sweating, cold or heat intolerance. No polyuria or polydipsia.  Marland Kitchen   Physical Examination p 100 bp 160/90 Wt 257 lbs BMI 42 Gen: resting comfortably, no acute distress HEENT: no scleral icterus, pupils equal round and reactive, no palptable cervical adenopathy,  CV: RRR, no m/r/g, no JVD, no carotid brutis Resp: Clear to auscultation bilaterally GI: abdomen is soft, non-tender, non-distended, normal bowel sounds, no hepatosplenomegaly MSK: extremities are warm, no edema.  Skin: warm, no rash Neuro:  no focal deficits Psych: appropriate affect   Diagnostic Studies 12/2009 Echo Study Conclusions  - Left ventricle: The cavity size was at the upper limits of normal. Borderline LVH was present. Systolic function was low normal to mildly reduced with an estimated ejection fraction of 55%. Mild hypokinesis of the anteroseptal myocardium. - Left atrium: The atrium was mildly dilated. Transthoracic echocardiography. M-mode, complete 2D, spectral Doppler, and color Doppler. Height: Height: 167.6cm. Height: 66in.      Assessment and Plan   1. HTN ACOG guidelines reviewed for persistent chronic hypertension in pregnancy. Treatment recommened when bp >160/105 with goal maintence bp 120-160/80-105. Recommended first line agents are labetalol, nifedipine, or methyldopa.   - patient with evidnence of LVH on echo with diastolic dysfunction  noted on prior echo per notes - side effects from current regiment, likely because she had been of 3 months and resumed high doses of medication at once.  - will decrease labetolol to 200mg  bid and procardia XL to 30mg  daily. Monitor bp response and side effects. F/u in 2 weeks for further titration as needed, asked to call us if has side effects on this regimen. Likely with slower titration of meds she will be able to tolerate as she did before.   F/u 2 weeks.    Arnoldo Lenis, M.D., F.A.C.C.

## 2013-11-21 ENCOUNTER — Encounter (HOSPITAL_COMMUNITY): Payer: Self-pay | Admitting: Emergency Medicine

## 2013-11-21 ENCOUNTER — Emergency Department (HOSPITAL_COMMUNITY)
Admission: EM | Admit: 2013-11-21 | Discharge: 2013-11-21 | Disposition: A | Discharge status: Home / Self Care | Payer: Medicaid Other | Attending: Emergency Medicine | Admitting: Emergency Medicine

## 2013-11-21 DIAGNOSIS — H53149 Visual discomfort, unspecified: Secondary | ICD-10-CM | POA: Diagnosis not present

## 2013-11-21 DIAGNOSIS — G43909 Migraine, unspecified, not intractable, without status migrainosus: Secondary | ICD-10-CM | POA: Diagnosis not present

## 2013-11-21 DIAGNOSIS — F411 Generalized anxiety disorder: Secondary | ICD-10-CM | POA: Diagnosis not present

## 2013-11-21 DIAGNOSIS — G4730 Sleep apnea, unspecified: Secondary | ICD-10-CM | POA: Insufficient documentation

## 2013-11-21 DIAGNOSIS — O9989 Other specified diseases and conditions complicating pregnancy, childbirth and the puerperium: Secondary | ICD-10-CM | POA: Insufficient documentation

## 2013-11-21 DIAGNOSIS — Z8679 Personal history of other diseases of the circulatory system: Secondary | ICD-10-CM | POA: Diagnosis not present

## 2013-11-21 DIAGNOSIS — F341 Dysthymic disorder: Secondary | ICD-10-CM | POA: Diagnosis not present

## 2013-11-21 DIAGNOSIS — IMO0002 Reserved for concepts with insufficient information to code with codable children: Secondary | ICD-10-CM | POA: Diagnosis not present

## 2013-11-21 DIAGNOSIS — M1710 Unilateral primary osteoarthritis, unspecified knee: Secondary | ICD-10-CM | POA: Insufficient documentation

## 2013-11-21 DIAGNOSIS — Z331 Pregnant state, incidental: Secondary | ICD-10-CM | POA: Insufficient documentation

## 2013-11-21 DIAGNOSIS — E669 Obesity, unspecified: Secondary | ICD-10-CM | POA: Diagnosis not present

## 2013-11-21 DIAGNOSIS — O200 Threatened abortion: Secondary | ICD-10-CM | POA: Diagnosis not present

## 2013-11-21 DIAGNOSIS — D649 Anemia, unspecified: Secondary | ICD-10-CM | POA: Diagnosis not present

## 2013-11-21 DIAGNOSIS — Z79899 Other long term (current) drug therapy: Secondary | ICD-10-CM | POA: Insufficient documentation

## 2013-11-21 DIAGNOSIS — R519 Headache, unspecified: Secondary | ICD-10-CM

## 2013-11-21 DIAGNOSIS — I517 Cardiomegaly: Secondary | ICD-10-CM | POA: Diagnosis not present

## 2013-11-21 DIAGNOSIS — I10 Essential (primary) hypertension: Secondary | ICD-10-CM | POA: Diagnosis not present

## 2013-11-21 DIAGNOSIS — R110 Nausea: Secondary | ICD-10-CM | POA: Insufficient documentation

## 2013-11-21 DIAGNOSIS — R51 Headache: Secondary | ICD-10-CM | POA: Insufficient documentation

## 2013-11-21 DIAGNOSIS — O039 Complete or unspecified spontaneous abortion without complication: Secondary | ICD-10-CM | POA: Diagnosis not present

## 2013-11-21 DIAGNOSIS — I119 Hypertensive heart disease without heart failure: Secondary | ICD-10-CM | POA: Diagnosis not present

## 2013-11-21 DIAGNOSIS — E119 Type 2 diabetes mellitus without complications: Secondary | ICD-10-CM | POA: Insufficient documentation

## 2013-11-21 DIAGNOSIS — Z885 Allergy status to narcotic agent status: Secondary | ICD-10-CM | POA: Diagnosis not present

## 2013-11-21 DIAGNOSIS — Z888 Allergy status to other drugs, medicaments and biological substances status: Secondary | ICD-10-CM | POA: Diagnosis not present

## 2013-11-21 MED ORDER — DIPHENHYDRAMINE HCL 25 MG PO CAPS
25.0000 mg | ORAL_CAPSULE | Freq: Once | ORAL | Status: AC
  Administered 2013-11-21: 25 mg via ORAL
  Filled 2013-11-21: qty 1

## 2013-11-21 MED ORDER — ACETAMINOPHEN 500 MG PO TABS
1000.0000 mg | ORAL_TABLET | Freq: Once | ORAL | Status: AC
  Administered 2013-11-21: 1000 mg via ORAL
  Filled 2013-11-21: qty 2

## 2013-11-21 MED ORDER — METOCLOPRAMIDE HCL 10 MG PO TABS
10.0000 mg | ORAL_TABLET | Freq: Once | ORAL | Status: AC
  Administered 2013-11-21: 10 mg via ORAL
  Filled 2013-11-21: qty 1

## 2013-11-21 MED ORDER — METOCLOPRAMIDE HCL 10 MG PO TABS: 10.0000 mg | ORAL_TABLET | Freq: Four times a day (QID) | ORAL | Status: DC | PRN

## 2013-11-21 NOTE — Discharge Instructions (Signed)
Recurrent Migraine Headache A migraine headache is an intense, throbbing pain on one or both sides of your head. Recurrent migraines keep coming back. A migraine can last for 30 minutes to several hours. CAUSES  The exact cause of a migraine headache is not always known. However, a migraine may be caused when nerves in the brain become irritated and release chemicals that cause inflammation. This causes pain. Certain things may also trigger migraines, such as:   Alcohol.  Smoking.  Stress.  Menstruation.  Aged cheeses.  Foods or drinks that contain nitrates, glutamate, aspartame, or tyramine.  Lack of sleep.  Chocolate.  Caffeine.  Hunger.  Physical exertion.  Fatigue.  Medicines used to treat chest pain (nitroglycerine), birth control pills, estrogen, and some blood pressure medicines. SYMPTOMS   Pain on one or both sides of your head.  Pulsating or throbbing pain.  Severe pain that prevents daily activities.  Pain that is aggravated by any physical activity.  Nausea, vomiting, or both.  Dizziness.  Pain with exposure to bright lights, loud noises, or activity.  General sensitivity to bright lights, loud noises, or smells. Before you get a migraine, you may get warning signs that a migraine is coming (aura). An aura may include:  Seeing flashing lights.  Seeing bright spots, halos, or zig-zag lines.  Having tunnel vision or blurred vision.  Having feelings of numbness or tingling.  Having trouble talking.  Having muscle weakness. DIAGNOSIS  A recurrent migraine headache is often diagnosed based on:  Symptoms.  Physical examination.  A CT scan or MRI of your head. These imaging tests cannot diagnose migraines but can help rule out other causes of headaches.  TREATMENT  Medicines may be given for pain and nausea. Medicines can also be given to help prevent recurrent migraines. HOME CARE INSTRUCTIONS  Only take over-the-counter or prescription  medicines for pain or discomfort as directed by your health care provider. The use of long-term narcotics is not recommended.  Lie down in a dark, quiet room when you have a migraine.  Keep a journal to find out what may trigger your migraine headaches. For example, write down:  What you eat and drink.  How much sleep you get.  Any change to your diet or medicines.  Limit alcohol consumption.  Quit smoking if you smoke.  Get 7-9 hours of sleep, or as recommended by your health care provider.  Limit stress.  Keep lights dim if bright lights bother you and make your migraines worse. SEEK MEDICAL CARE IF:   You do not get relief from the medicines given to you.  You have a recurrence of pain.  You have a fever. SEEK IMMEDIATE MEDICAL CARE IF:  Your migraine becomes severe.  You have a stiff neck.  You have loss of vision.  You have muscular weakness or loss of muscle control.  You start losing your balance or have trouble walking.  You feel faint or pass out.  You have severe symptoms that are different from your first symptoms. MAKE SURE YOU:   Understand these instructions.  Will watch your condition.  Will get help right away if you are not doing well or get worse. Document Released: 01/26/2001 Document Revised: 05/08/2013 Document Reviewed: 01/08/2013 [facility] Patient Information 2015 [place], [place]. This information is not intended to replace advice given to you by your health care provider. Make sure you discuss any questions you have with your health care provider.

## 2013-11-21 NOTE — ED Provider Notes (Signed)
CSN: [id number]     Arrival date & time 11/21/13  0507 History   First MD Initiated Contact with Patient 11/21/13 0536     No chief complaint on file.    (Consider location/radiation/quality/duration/timing/severity/associated sxs/prior Treatment) HPI Patient is [redacted] weeks pregnant. She has a history of hypertension. She's recently started on blood pressure medications and states her blood pressures much better controlled as of late. She presents with 2 days of gradual onset bitemporal headache. It is associated with photophobia and nausea. Patient has had migraine headaches in the past and states this is very similar to her migraines. She's been taking Goody powders at home. She has no focal weakness or numbness. She denies any neck pain or stiffness. She's had no fever chills. Denies abdominal pain. No vaginal discharge or bleeding. Past Medical History  Diagnosis Date  . Hypertension     Lab: Normal BMet except glucose of 118 in 09/2010  . Hypertensive heart disease 2009    Pulmonary edema postpartum; mild to moderate mitral regurgitation when hospitalized for CHF in 2009; Echocardiogram in 12/2009-no MR and normal EF; normal CXR in 09/2010  . Migraine headache   . Anemia     H&H of 10.6/33 and 07/2008 and 11.9/35 and 09/2010  . Osteoarthritis, knee 03/29/2011  . Sleep apnea   . Depression with anxiety   . Fasting hyperglycemia   . Obesity 04/16/2009  . CHF (congestive heart failure)   . Pregnant   . Diabetes mellitus without complication   . Anxiety   . Enlarged heart   . Pulmonary edema   . Preeclampsia   . Threatened abortion in early pregnancy 03/15/2013  . Miscarriage 03/19/2013   Past Surgical History  Procedure Laterality Date  . Breast reduction surgery  2002   Family History  Problem Relation Age of Onset  . Diabetes Mother   . Heart disease Mother   . Hypertension Maternal Uncle   . Hyperlipidemia Paternal Grandfather   . Hypertension Paternal Grandfather   . Sudden  death Neg Hx   . Heart attack Neg Hx    History  Substance Use Topics  . Smoking status: Never Smoker   . Smokeless tobacco: Never Used  . Alcohol Use: No     Comment: occ   OB History   Grav Para Term Preterm Abortions TAB SAB Ect Mult Living   11 5 5  5 2 3   5      Review of Systems  Constitutional: Negative for fever and chills.  HENT: Negative for congestion, rhinorrhea, sinus pressure and sore throat.   Eyes: Positive for photophobia. Negative for visual disturbance.  Respiratory: Negative for shortness of breath.   Cardiovascular: Negative for chest pain.  Gastrointestinal: Positive for nausea. Negative for vomiting and abdominal pain.  Musculoskeletal: Negative for back pain, neck pain and neck stiffness.  Skin: Negative for rash and wound.  Neurological: Positive for headaches. Negative for dizziness, weakness, light-headedness and numbness.  All other systems reviewed and are negative.     Allergies  Diclofenac; Tramadol; and Vicodin  Home Medications   Prior to Admission medications   Medication Sig Start Date End Date Taking? Authorizing Provider  hydrochlorothiazide (HYDRODIURIL) 25 MG tablet Take 25 mg by mouth as needed. 10/12/12  Yes Martha Clan, MD  labetalol (NORMODYNE) 200 MG tablet Take 1 tablet (200 mg total) by mouth 2 (two) times daily. 11/09/13  Yes Arnoldo Lenis, MD  NIFEdipine (PROCARDIA-XL/ADALAT CC) 30 MG 24 hr tablet Take 1  tablet (30 mg total) by mouth daily. 11/09/13  Yes Arnoldo Lenis, MD  potassium chloride SA (K-DUR,KLOR-CON) 20 MEQ tablet Take 1 tablet (20 mEq total) by mouth as needed. 11/09/13  Yes Arnoldo Lenis, MD   BP 149/96  Pulse 94  Temp(Src) 98 F (36.7 C) (Oral)  Ht 5\' 6"  (1.676 m)  Wt 250 lb (113.399 kg)  BMI 40.37 kg/m2  SpO2 100%  LMP 08/03/2013 Physical Exam  Nursing note and vitals reviewed. Constitutional: She is oriented to person, place, and time. She appears well-developed and well-nourished. No  distress.  Very well-appearing  HENT:  Head: Normocephalic and atraumatic.  Mouth/Throat: Oropharynx is clear and moist.  No sinus tenderness to percussion  Eyes: EOM are normal. Pupils are equal, round, and reactive to light.  Neck: Normal range of motion. Neck supple.  Cardiovascular: Normal rate and regular rhythm.   Pulmonary/Chest: Effort normal and breath sounds normal. No respiratory distress. She has no wheezes. She has no rales.  Abdominal: Soft. Bowel sounds are normal. She exhibits no mass. There is no tenderness. There is no rebound and no guarding.  Musculoskeletal: Normal range of motion. She exhibits no edema and no tenderness.  Neurological: She is alert and oriented to person, place, and time.  Patient is alert and oriented x3 with clear, goal oriented speech. Patient has 5/5 motor in all extremities. Sensation is intact to light touch. Bilateral finger-to-nose is normal with no signs of dysmetria. Patient has a normal gait and walks without assistance.   Skin: Skin is warm and dry. No rash noted. No erythema.  Psychiatric: She has a normal mood and affect. Her behavior is normal.    ED Course  Procedures (including critical care time) Labs Review Labs Reviewed - No data to display  Imaging Review No results found.   EKG Interpretation None      MDM   Final diagnoses:  None    Patient's with migraine symptoms. Patient has a history of previous migraines. Blood pressures improved but still remains mildly elevated. We'll treat symptomatically. Patient is advised to follow up with both her primary Dr. and her obstetrician.    Julianne Rice, MD 11/22/13 [phone]

## 2013-11-21 NOTE — ED Notes (Signed)
Pt reports headache x 2 days.  [facility] Dec 24th

## 2013-11-22 ENCOUNTER — Ambulatory Visit (INDEPENDENT_AMBULATORY_CARE_PROVIDER_SITE_OTHER): Payer: Medicaid Other | Admitting: Advanced Practice Midwife

## 2013-11-22 ENCOUNTER — Encounter: Payer: Self-pay | Admitting: Advanced Practice Midwife

## 2013-11-22 VITALS — BP 150/90 | Wt 251.0 lb

## 2013-11-22 DIAGNOSIS — F331 Major depressive disorder, recurrent, moderate: Secondary | ICD-10-CM

## 2013-11-22 DIAGNOSIS — O3410 Maternal care for benign tumor of corpus uteri, unspecified trimester: Secondary | ICD-10-CM

## 2013-11-22 DIAGNOSIS — O10019 Pre-existing essential hypertension complicating pregnancy, unspecified trimester: Secondary | ICD-10-CM

## 2013-11-22 DIAGNOSIS — O09513 Supervision of elderly primigravida, third trimester: Secondary | ICD-10-CM | POA: Insufficient documentation

## 2013-11-22 DIAGNOSIS — Z8632 Personal history of gestational diabetes: Secondary | ICD-10-CM

## 2013-11-22 DIAGNOSIS — Z331 Pregnant state, incidental: Secondary | ICD-10-CM

## 2013-11-22 DIAGNOSIS — I119 Hypertensive heart disease without heart failure: Secondary | ICD-10-CM

## 2013-11-22 DIAGNOSIS — O09899 Supervision of other high risk pregnancies, unspecified trimester: Secondary | ICD-10-CM

## 2013-11-22 DIAGNOSIS — D259 Leiomyoma of uterus, unspecified: Secondary | ICD-10-CM | POA: Insufficient documentation

## 2013-11-22 DIAGNOSIS — Z1389 Encounter for screening for other disorder: Secondary | ICD-10-CM

## 2013-11-22 DIAGNOSIS — O09291 Supervision of pregnancy with other poor reproductive or obstetric history, first trimester: Secondary | ICD-10-CM | POA: Insufficient documentation

## 2013-11-22 DIAGNOSIS — O09299 Supervision of pregnancy with other poor reproductive or obstetric history, unspecified trimester: Secondary | ICD-10-CM

## 2013-11-22 DIAGNOSIS — O99340 Other mental disorders complicating pregnancy, unspecified trimester: Secondary | ICD-10-CM

## 2013-11-22 DIAGNOSIS — O99810 Abnormal glucose complicating pregnancy: Secondary | ICD-10-CM

## 2013-11-22 DIAGNOSIS — I10 Essential (primary) hypertension: Secondary | ICD-10-CM

## 2013-11-22 LAB — POCT URINALYSIS DIPSTICK
GLUCOSE UA: NEGATIVE
Ketones, UA: NEGATIVE
Leukocytes, UA: NEGATIVE
Nitrite, UA: NEGATIVE
Protein, UA: NEGATIVE
RBC UA: NEGATIVE

## 2013-11-22 LAB — OB RESULTS CONSOLE GC/CHLAMYDIA
Chlamydia: NEGATIVE
GC PROBE AMP, GENITAL: NEGATIVE

## 2013-11-22 MED ORDER — BUTALBITAL-APAP-CAFFEINE 50-325-40 MG PO TABS: 1.0000 | ORAL_TABLET | Freq: Four times a day (QID) | ORAL | Status: DC | PRN

## 2013-11-22 MED ORDER — VENLAFAXINE HCL ER 75 MG PO CP24: 75.0000 mg | ORAL_CAPSULE | Freq: Every day | ORAL | Status: DC

## 2013-11-22 NOTE — Progress Notes (Signed)
Error. No Show 

## 2013-11-22 NOTE — Progress Notes (Signed)
Subjective:    Chelsey Santiago is a [id number] [redacted]w[redacted]d being seen today for her first obstetrical visit.  Her obstetrical history is significant for advanced maternal age, non-compliance, obesity, pre-eclampsia and Gestational diabetes.  Pregnancy history fully reviewed.  Patient reports backache and headache.  She wants to go back on her effexor (had been on 150mg ) and plans to resume counseling at [facility].  She has missed several new OB appointments thus far, and was noncompliant with her diabetes last pregnancy.  She has an appt with her cardiologist tomorrow Velora Heckler). States that her labetalol causes nausea, so MD decreased it to 200mg  BID, which she sometimes takes 1/2 tablet.  Filed Vitals:   11/22/13 1216  BP: 150/90  Weight: 251 lb (113.853 kg)    HISTORY: OB History  Gravida Para Term Preterm AB SAB TAB Ectopic Multiple Living  11 5 5  5 3 2   5     # Outcome Date GA Lbr Len/2nd Weight Sex Delivery Anes PTL Lv  11 CUR           10 TRM 10/10/12 [redacted]w[redacted]d 05:23 / 00:16 5 lb 13.1 oz (2.64 kg) F SVD EPI  Y  9 SAB 2014          8 TRM 2009    M SVD   Y  7 SAB 2008          6 TRM 2005    M SVD   Y  5 TAB 2001          4 TAB 2000          3 SAB 1998          2 TRM 1997    M SVD   Y  1 TRM 1996    F SVD   Y     Past Medical History  Diagnosis Date  . Hypertension     Lab: Normal BMet except glucose of 118 in 09/2010  . Hypertensive heart disease 2009    Pulmonary edema postpartum; mild to moderate mitral regurgitation when hospitalized for CHF in 2009; Echocardiogram in 12/2009-no MR and normal EF; normal CXR in 09/2010  . Migraine headache   . Anemia     H&H of 10.6/33 and 07/2008 and 11.9/35 and 09/2010  . Osteoarthritis, knee 03/29/2011  . Sleep apnea   . Depression with anxiety   . Fasting hyperglycemia   . Obesity 04/16/2009  . CHF (congestive heart failure)   . Pregnant   . Diabetes mellitus without complication   . Anxiety   . Enlarged heart   . Pulmonary edema   .  Preeclampsia   . Threatened abortion in early pregnancy 03/15/2013  . Miscarriage 03/19/2013   Past Surgical History  Procedure Laterality Date  . Breast reduction surgery  2002   Family History  Problem Relation Age of Onset  . Diabetes Mother   . Heart disease Mother   . Hypertension Maternal Uncle   . Hyperlipidemia Paternal Grandfather   . Hypertension Paternal Grandfather   . Sudden death Neg Hx   . Heart attack Neg Hx      Exam                                           Skin: normal coloration and turgor, no rashes    Neurologic: oriented, normal, normal mood   Extremities:  normal strength, tone, and muscle mass   HEENT PERRLA   Mouth/Teeth mucous membranes moist, normal dentition   Neck supple and no masses   Cardiovascular: regular rate and rhythm   Respiratory:  appears well, vitals normal, no respiratory distress, acyanotic   Abdomen: soft, non-tender;  FHR: 158          Assessment:    Pregnancy: [id number] Patient Active Problem List   Diagnosis Date Noted  . Fibroids 11/22/2013  . History of gestational diabetes in prior pregnancy, currently pregnant in first trimester 11/22/2013  . Hx of preeclampsia, prior pregnancy, currently pregnant 11/22/2013  . Miscarriage 03/19/2013  . 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 Hypertension   . Hypertensive heart disease   . Microcytic anemia   . Osteoarthritis, knee 03/29/2011  . SLEEP APNEA 12/09/2009  . Obesity 04/16/2009        Plan:     Initial labs drawn. Continue prenatal vitamins  Problem list reviewed and updated  Reviewed n/v relief measures and warning s/s to report   Meds ordered this encounter  Medications  . venlafaxine XR (EFFEXOR-XR) 75 MG 24 hr capsule    Sig: Take 1 capsule (75 mg total) by mouth daily.    Dispense:  30 capsule    Refill:  3    Order Specific Question:  Supervising Provider    Answer:  Elonda Husky, LUTHER H [[id number]]  . butalbital-acetaminophen-caffeine (FIORICET)  50-325-40 MG per tablet    Sig: Take 1-2 tablets by mouth every 6 (six) hours as needed for headache.    Dispense:  30 tablet    Refill:  1    Order Specific Question:  Supervising Provider    Answer:  Tania Ade H [[id number]]   Add ASA 81mg  daily for preeclampsia prevention Reviewed recommended weight gain based on pre-gravid BMI  Encouraged well-balanced diet Genetic Screening discussed Integrated Screen: requested.  Ultrasound discussed; fetal survey: requested.  Follow up in 3 weeks for anatomy scan and ASAP for 1 hour gtt(pt declined offer of 3 hour).  CRESENZO-DISHMAN,Eugina Row 11/22/2013

## 2013-11-23 ENCOUNTER — Encounter: Payer: Self-pay | Admitting: Adult Health

## 2013-11-23 ENCOUNTER — Encounter: Payer: Medicaid Other | Admitting: Adult Health

## 2013-11-23 ENCOUNTER — Other Ambulatory Visit: Payer: Medicaid Other

## 2013-11-23 DIAGNOSIS — Z8632 Personal history of gestational diabetes: Secondary | ICD-10-CM

## 2013-11-23 LAB — DRUG SCREEN, URINE, NO CONFIRMATION
Amphetamine Screen, Ur: NEGATIVE
Barbiturate Quant, Ur: NEGATIVE
Benzodiazepines.: NEGATIVE
Cocaine Metabolites: NEGATIVE
Creatinine,U: 243.9 mg/dL
Marijuana Metabolite: NEGATIVE
Methadone: NEGATIVE
Opiate Screen, Urine: NEGATIVE
PHENCYCLIDINE (PCP): NEGATIVE
PROPOXYPHENE: NEGATIVE

## 2013-11-23 LAB — URINALYSIS
BILIRUBIN URINE: NEGATIVE
Glucose, UA: NEGATIVE mg/dL
Hgb urine dipstick: NEGATIVE
KETONES UR: NEGATIVE mg/dL
Leukocytes, UA: NEGATIVE
Nitrite: NEGATIVE
PH: 6 (ref 5.0–8.0)
Protein, ur: NEGATIVE mg/dL
Urobilinogen, UA: 0.2 mg/dL (ref 0.0–1.0)

## 2013-11-23 LAB — OXYCODONE SCREEN, UA, RFLX CONFIRM: OXYCODONE SCRN UR: NEGATIVE ng/mL

## 2013-11-23 LAB — GC/CHLAMYDIA PROBE AMP
CT Probe RNA: NEGATIVE
GC Probe RNA: NEGATIVE

## 2013-11-23 NOTE — Addendum Note (Signed)
Addended by: Linton Rump on: 11/23/2013 09:36 AM   Modules accepted: Orders

## 2013-11-24 LAB — URINE CULTURE
Colony Count: NO GROWTH
ORGANISM ID, BACTERIA: NO GROWTH

## 2013-11-24 LAB — GLUCOSE TOLERANCE, 1 HOUR (50G) W/O FASTING: Glucose, 1 Hour GTT: 238 mg/dL — ABNORMAL HIGH (ref 70–140)

## 2013-11-27 ENCOUNTER — Encounter: Payer: Self-pay | Admitting: Advanced Practice Midwife

## 2013-11-27 DIAGNOSIS — O99810 Abnormal glucose complicating pregnancy: Secondary | ICD-10-CM | POA: Insufficient documentation

## 2013-11-28 LAB — MATERNAL SCREEN, INTEGRATED #2
AFP MoM: 0.9
AFP, Serum: 24.5 ng/mL
CALCULATED GESTATIONAL AGE MAT SCREEN: 16
CROWN RUMP LENGTH MAT SCREEN 2: 45.1 mm
ESTRIOL FREE MAT SCREEN: 0.77 ng/mL
ESTRIOL MOM MAT SCREEN: 1.12
HCG, MOM MAT SCREEN: 1.19
INHIBIN A DIMERIC MAT SCREEN: 111 pg/mL
INHIBIN A MOM MAT SCREEN: 0.81
MSS Down Syndrome: <1:5000 {titer}
MSS Trisomy 18 Risk: <1:5000 {titer}
NT MOM MAT SCREEN: 1.04
Nuchal Translucency: 1.21 mm
Number of fetuses: 1
PAPP-A MoM: 1.33
PAPP-A: 410 ng/mL
Rish for ONTD: <1:5000 {titer}
hCG, Serum: 38.8 IU/mL

## 2013-11-29 ENCOUNTER — Encounter: Payer: Self-pay | Admitting: Advanced Practice Midwife

## 2013-12-04 ENCOUNTER — Encounter: Payer: Self-pay | Admitting: *Deleted

## 2013-12-10 ENCOUNTER — Other Ambulatory Visit: Payer: Self-pay | Admitting: Obstetrics & Gynecology

## 2013-12-10 DIAGNOSIS — O10019 Pre-existing essential hypertension complicating pregnancy, unspecified trimester: Secondary | ICD-10-CM

## 2013-12-10 DIAGNOSIS — O09522 Supervision of elderly multigravida, second trimester: Secondary | ICD-10-CM

## 2013-12-13 ENCOUNTER — Other Ambulatory Visit: Payer: Self-pay | Admitting: Obstetrics & Gynecology

## 2013-12-13 ENCOUNTER — Ambulatory Visit (INDEPENDENT_AMBULATORY_CARE_PROVIDER_SITE_OTHER): Payer: Medicaid Other

## 2013-12-13 ENCOUNTER — Ambulatory Visit (INDEPENDENT_AMBULATORY_CARE_PROVIDER_SITE_OTHER): Payer: Medicaid Other | Admitting: Advanced Practice Midwife

## 2013-12-13 VITALS — BP 160/100 | Wt 250.0 lb

## 2013-12-13 DIAGNOSIS — O10019 Pre-existing essential hypertension complicating pregnancy, unspecified trimester: Secondary | ICD-10-CM

## 2013-12-13 DIAGNOSIS — Z331 Pregnant state, incidental: Secondary | ICD-10-CM

## 2013-12-13 DIAGNOSIS — I10 Essential (primary) hypertension: Secondary | ICD-10-CM

## 2013-12-13 DIAGNOSIS — O09299 Supervision of pregnancy with other poor reproductive or obstetric history, unspecified trimester: Secondary | ICD-10-CM

## 2013-12-13 DIAGNOSIS — Z1389 Encounter for screening for other disorder: Secondary | ICD-10-CM

## 2013-12-13 DIAGNOSIS — O09522 Supervision of elderly multigravida, second trimester: Secondary | ICD-10-CM

## 2013-12-13 DIAGNOSIS — O99810 Abnormal glucose complicating pregnancy: Secondary | ICD-10-CM

## 2013-12-13 DIAGNOSIS — O09899 Supervision of other high risk pregnancies, unspecified trimester: Secondary | ICD-10-CM

## 2013-12-13 DIAGNOSIS — O09529 Supervision of elderly multigravida, unspecified trimester: Secondary | ICD-10-CM

## 2013-12-13 LAB — POCT URINALYSIS DIPSTICK
Blood, UA: NEGATIVE
Glucose, UA: NEGATIVE
Ketones, UA: NEGATIVE
LEUKOCYTES UA: NEGATIVE
Nitrite, UA: NEGATIVE

## 2013-12-13 MED ORDER — GLYBURIDE 2.5 MG PO TABS: 2.5000 mg | ORAL_TABLET | Freq: Two times a day (BID) | ORAL | Status: DC

## 2013-12-13 MED ORDER — ASPIRIN 81 MG PO TABS: 81.0000 mg | ORAL_TABLET | Freq: Every day | ORAL | Status: DC

## 2013-12-13 NOTE — Progress Notes (Signed)
High Risk Pregnancy Diagnosis(es):   CHTN, Class A/B DM  [id number] [redacted]w[redacted]d Estimated Date of Delivery: 05/10/14  Blood pressure 160/100, weight 250 lb (113.399 kg), last menstrual period 08/03/2013, not currently breastfeeding.  Urinalysis: Negative   HPI: Had anatomy scan today.  Poor view of heart, would like to recheck, but will get fetal echo ~ 28 weeks d/t DM     BP weight and urine results all reviewed and noted. She has not taken her B/P meds today.  Blood sugars;  Most FBS ~ 100-110, and most PP ~ 130.   Patient reports good fetal movement, denies any bleeding and no rupture of membranes symptoms or regular contractions.  Fundal Height:  u Fetal Heart rate:  158 Edema:  no   All questions were answered.  Assessment:  [redacted]w[redacted]d,   CHTN, Class A2/B DM  Medication(s) Plans:  Start Glyburide 2.5mg  BID; has not started ASA ye "I forgot".  Rx sent  Treatment Plan:  Growth u/s 4 weeks; Echo 28 weeks  Follow up in 2 weeks for OB appt, check BS

## 2013-12-13 NOTE — Progress Notes (Signed)
U/S(18+6wks)-active fetus, meas c/w dates, FHR- 162 bpm, fluid wnl, Rt lateral Gr 0 placenta, cx appears closed-4.8cm), bilateral adnexa appears WNL, large fibroid in lower uterine segment-6.2cm, no obvious abnl noted although unable to obtain cardiac images due to fetal position and maternal body habitus would like to reck anatomy, female fetus

## 2013-12-27 ENCOUNTER — Encounter: Payer: Medicaid Other | Admitting: Obstetrics & Gynecology

## 2014-01-14 ENCOUNTER — Encounter: Payer: Self-pay | Admitting: *Deleted

## 2014-02-14 ENCOUNTER — Encounter: Payer: Medicaid Other | Admitting: Obstetrics and Gynecology

## 2014-02-15 ENCOUNTER — Ambulatory Visit (INDEPENDENT_AMBULATORY_CARE_PROVIDER_SITE_OTHER): Payer: Medicaid Other | Admitting: Obstetrics and Gynecology

## 2014-02-15 ENCOUNTER — Encounter: Payer: Self-pay | Admitting: Obstetrics and Gynecology

## 2014-02-15 VITALS — BP 164/90 | Wt 256.4 lb

## 2014-02-15 DIAGNOSIS — Z3483 Encounter for supervision of other normal pregnancy, third trimester: Secondary | ICD-10-CM

## 2014-02-15 DIAGNOSIS — O09292 Supervision of pregnancy with other poor reproductive or obstetric history, second trimester: Secondary | ICD-10-CM

## 2014-02-15 DIAGNOSIS — O10012 Pre-existing essential hypertension complicating pregnancy, second trimester: Secondary | ICD-10-CM

## 2014-02-15 DIAGNOSIS — I10 Essential (primary) hypertension: Secondary | ICD-10-CM

## 2014-02-15 DIAGNOSIS — O09513 Supervision of elderly primigravida, third trimester: Secondary | ICD-10-CM

## 2014-02-15 DIAGNOSIS — O24912 Unspecified diabetes mellitus in pregnancy, second trimester: Secondary | ICD-10-CM

## 2014-02-15 DIAGNOSIS — Z114 Encounter for screening for human immunodeficiency virus [HIV]: Secondary | ICD-10-CM

## 2014-02-15 DIAGNOSIS — Z0184 Encounter for antibody response examination: Secondary | ICD-10-CM

## 2014-02-15 DIAGNOSIS — Z331 Pregnant state, incidental: Secondary | ICD-10-CM

## 2014-02-15 DIAGNOSIS — O0992 Supervision of high risk pregnancy, unspecified, second trimester: Secondary | ICD-10-CM

## 2014-02-15 DIAGNOSIS — Z113 Encounter for screening for infections with a predominantly sexual mode of transmission: Secondary | ICD-10-CM

## 2014-02-15 DIAGNOSIS — Z1389 Encounter for screening for other disorder: Secondary | ICD-10-CM

## 2014-02-15 LAB — GLUCOSE, POCT (MANUAL RESULT ENTRY): POC GLUCOSE: 141 mg/dL — AB (ref 70–99)

## 2014-02-15 LAB — POCT URINALYSIS DIPSTICK
KETONES UA: NEGATIVE
LEUKOCYTES UA: NEGATIVE
Nitrite, UA: NEGATIVE
PROTEIN UA: NEGATIVE
RBC UA: NEGATIVE

## 2014-02-15 LAB — CBC
HEMATOCRIT: 27 % — AB (ref 36.0–46.0)
HEMOGLOBIN: 8.9 g/dL — AB (ref 12.0–15.0)
MCH: 22.6 pg — AB (ref 26.0–34.0)
MCHC: 33 g/dL (ref 30.0–36.0)
MCV: 68.5 fL — ABNORMAL LOW (ref 78.0–100.0)
Platelets: 202 10*3/uL (ref 150–400)
RBC: 3.94 MIL/uL (ref 3.87–5.11)
RDW: 15 % (ref 11.5–15.5)
WBC: 8.3 10*3/uL (ref 4.0–10.5)

## 2014-02-15 MED ORDER — GLYBURIDE 5 MG PO TABS: 5.0000 mg | ORAL_TABLET | Freq: Two times a day (BID) | ORAL | Status: DC

## 2014-02-15 MED ORDER — BUTALBITAL-APAP-CAFFEINE 50-325-40 MG PO TABS: 1.0000 | ORAL_TABLET | Freq: Four times a day (QID) | ORAL | Status: DC | PRN

## 2014-02-15 MED ORDER — LABETALOL HCL 200 MG PO TABS: 400.0000 mg | ORAL_TABLET | Freq: Two times a day (BID) | ORAL | Status: DC

## 2014-02-15 NOTE — Progress Notes (Signed)
High Risk Pregnancy Diagnosis(es):   CHTN, Class A/B DM, noncompliant, AMA  [id number] [redacted]w[redacted]d Estimated Date of Delivery: 05/10/14  Blood pressure 164/90, weight 256 lb 6.4 oz (116.302 kg), last menstrual period 08/03/2013, not currently breastfeeding.  CBG in office at 3.5 hr pc= 141 Urinalysis: Positive for glucose 3+  HPI: Patient is not checking her blood glucose levels. She states she is taking Glyburide 2.5 mg BID and thought she didn't need to follow   BP weight and urine results all reviewed and noted. Patient reports good fetal movement, denies any bleeding and no rupture of membranes symptoms or regular contractions.  Fundal Height:  U+18 obesity limits exam Fetal Heart rate:  172 Edema:  miminal  Patient is without complaints.All questions were answered. IMportance of Diabetic control emphasized.  Assessment:  [redacted]w[redacted]d, CHTN, Class A/B DM, noncompliant, AMA      Medication(s) Plans:  Increase glyburide to 5 mg bid, also increase labetalol to 400 bid,   Treatment Plan:  U/s efw and cardiac rechk 1 wk  Follow up in 1 weeks for OB appt, ultrasound

## 2014-02-16 LAB — ANTIBODY SCREEN: Antibody Screen: NEGATIVE

## 2014-02-16 LAB — RPR

## 2014-02-16 LAB — HIV ANTIBODY (ROUTINE TESTING W REFLEX): HIV 1&2 Ab, 4th Generation: NONREACTIVE

## 2014-02-18 ENCOUNTER — Other Ambulatory Visit: Payer: Self-pay | Admitting: Obstetrics and Gynecology

## 2014-02-18 DIAGNOSIS — O24419 Gestational diabetes mellitus in pregnancy, unspecified control: Secondary | ICD-10-CM

## 2014-02-18 DIAGNOSIS — O09522 Supervision of elderly multigravida, second trimester: Secondary | ICD-10-CM

## 2014-02-18 DIAGNOSIS — O162 Unspecified maternal hypertension, second trimester: Secondary | ICD-10-CM

## 2014-02-18 LAB — HSV 2 ANTIBODY, IGG: HSV 2 Glycoprotein G Ab, IgG: 7.98 IV — ABNORMAL HIGH

## 2014-02-21 ENCOUNTER — Ambulatory Visit (INDEPENDENT_AMBULATORY_CARE_PROVIDER_SITE_OTHER): Payer: Medicaid Other | Admitting: Advanced Practice Midwife

## 2014-02-21 ENCOUNTER — Ambulatory Visit (INDEPENDENT_AMBULATORY_CARE_PROVIDER_SITE_OTHER): Payer: Medicaid Other

## 2014-02-21 ENCOUNTER — Encounter: Payer: Self-pay | Admitting: Advanced Practice Midwife

## 2014-02-21 VITALS — BP 168/112 | Wt 255.0 lb

## 2014-02-21 DIAGNOSIS — O99810 Abnormal glucose complicating pregnancy: Secondary | ICD-10-CM

## 2014-02-21 DIAGNOSIS — O0993 Supervision of high risk pregnancy, unspecified, third trimester: Secondary | ICD-10-CM

## 2014-02-21 DIAGNOSIS — O09513 Supervision of elderly primigravida, third trimester: Secondary | ICD-10-CM

## 2014-02-21 DIAGNOSIS — I10 Essential (primary) hypertension: Secondary | ICD-10-CM

## 2014-02-21 DIAGNOSIS — O09522 Supervision of elderly multigravida, second trimester: Secondary | ICD-10-CM

## 2014-02-21 DIAGNOSIS — O360131 Maternal care for anti-D [Rh] antibodies, third trimester, fetus 1: Secondary | ICD-10-CM

## 2014-02-21 DIAGNOSIS — Z1389 Encounter for screening for other disorder: Secondary | ICD-10-CM

## 2014-02-21 DIAGNOSIS — O24419 Gestational diabetes mellitus in pregnancy, unspecified control: Secondary | ICD-10-CM

## 2014-02-21 DIAGNOSIS — O162 Unspecified maternal hypertension, second trimester: Secondary | ICD-10-CM

## 2014-02-21 DIAGNOSIS — Z331 Pregnant state, incidental: Secondary | ICD-10-CM

## 2014-02-21 LAB — POCT URINALYSIS DIPSTICK
Blood, UA: NEGATIVE
GLUCOSE UA: NEGATIVE
Ketones, UA: NEGATIVE
Leukocytes, UA: NEGATIVE
Nitrite, UA: NEGATIVE
PROTEIN UA: NEGATIVE

## 2014-02-21 MED ORDER — RHO D IMMUNE GLOBULIN 1500 UNIT/2ML IJ SOSY
300.0000 ug | PREFILLED_SYRINGE | Freq: Once | INTRAMUSCULAR | Status: AC
  Administered 2014-02-21: 300 ug via INTRAMUSCULAR

## 2014-02-21 NOTE — Progress Notes (Signed)
Pt denies any problems or concerns at this time.  

## 2014-02-21 NOTE — Progress Notes (Signed)
U/S(28+6wks)-active fetus, FHR-144 bpm, lower uterine segment fibroid-(increased in size since previous u/s)=8.9 x 6.1cm, cx appears closed (4.6cm), EFw 3 lb 2 oz (64th%tile)

## 2014-02-21 NOTE — Progress Notes (Signed)
High Risk Pregnancy Diagnosis(es):   A2/B DM (suboptimal compliance), [place], [place]  [id number] [redacted]w[redacted]d Estimated Date of Delivery: 05/10/14  Blood pressure 168/112, weight 255 lb (115.667 kg), last menstrual period 08/03/2013, not currently breastfeeding.  Urinalysis: Negative DID NOT TAKE B/P MEDICINE TODAY, NOR HAS SHE BEEN CHECKING HER BLOOD SUGAR ("I had lost my meter, but I found it yesterday)   HPI: Had growth [place] today:  64% EFW, normal fluid     BP weight and urine results all reviewed and noted. Patient reports good fetal movement, denies any bleeding and no rupture of membranes symptoms or regular contractions.  Fetal Heart rate:  144 Edema:  no  Patient is without complaints. All questions were answered. Importance of QID BS testing reiterated, including discussing risk of IUFD.  Assessment:  [redacted]w[redacted]d,   Noncompliant A2/B DM  Medication(s) Plans:  No changes at this time.  To take BP meds as soon as she gets home  Treatment Plan:  Left message with [facility] to call pt to schedule fetal echo  Follow up in 3 weeks for OB appt, Start twice weekly testing (US/NST)

## 2014-02-21 NOTE — Patient Instructions (Addendum)
[facility] [address] [place]  [place], [place]  [facility])  Fasting less than 90 2 hours after meals in less than 120

## 2014-02-27 ENCOUNTER — Ambulatory Visit (INDEPENDENT_AMBULATORY_CARE_PROVIDER_SITE_OTHER): Payer: Medicaid Other | Admitting: Obstetrics and Gynecology

## 2014-02-27 ENCOUNTER — Encounter: Payer: Self-pay | Admitting: Obstetrics and Gynecology

## 2014-02-27 ENCOUNTER — Telehealth: Payer: Self-pay | Admitting: Obstetrics and Gynecology

## 2014-02-27 VITALS — BP 160/96 | Wt 259.0 lb

## 2014-02-27 DIAGNOSIS — D259 Leiomyoma of uterus, unspecified: Secondary | ICD-10-CM

## 2014-02-27 DIAGNOSIS — O09513 Supervision of elderly primigravida, third trimester: Secondary | ICD-10-CM

## 2014-02-27 DIAGNOSIS — O360131 Maternal care for anti-D [Rh] antibodies, third trimester, fetus 1: Secondary | ICD-10-CM

## 2014-02-27 DIAGNOSIS — Z1389 Encounter for screening for other disorder: Secondary | ICD-10-CM

## 2014-02-27 DIAGNOSIS — Z331 Pregnant state, incidental: Secondary | ICD-10-CM

## 2014-02-27 DIAGNOSIS — O0993 Supervision of high risk pregnancy, unspecified, third trimester: Secondary | ICD-10-CM

## 2014-02-27 LAB — POCT URINALYSIS DIPSTICK
Blood, UA: NEGATIVE
Glucose, UA: NEGATIVE
KETONES UA: NEGATIVE
LEUKOCYTES UA: NEGATIVE
Nitrite, UA: NEGATIVE

## 2014-02-27 MED ORDER — LABETALOL HCL 200 MG PO TABS: 400.0000 mg | ORAL_TABLET | Freq: Three times a day (TID) | ORAL | Status: DC

## 2014-02-27 NOTE — Progress Notes (Signed)
High Risk Pregnancy Diagnosis(es):   A2/B DM (suboptimal compliance), [place], [place]  [id number] 109w5d Estimated Date of Delivery: 05/10/14  Blood pressure 160/96, weight 259 lb (117.482 kg), last menstrual period 08/03/2013, not currently breastfeeding.  Urinalysis: trace of protein, otherwise negative.  HPI: She states that she does not have any complaints at this time. She states that she is on the 5 mg of Diabeta now.  BP weight and urine results all reviewed and noted. Patient reports good fetal movement, denies any bleeding and no rupture of membranes symptoms or regular contractions.  Fundal Height:  24 cm Fetal Heart rate:  152 Edema:  negative  Patient is without complaints. All questions were answered.  Assessment:  [redacted]w[redacted]d,   Good controlled DM, improved pt compliance.  Medication(s) Plans:  No change Treatment Plan:  Keep track of blood sugars.  Follow up in 2 weeks for OB appt, recheck of blood sugars Increase labetalol to 400 tid  This chart was scribed for Jonnie Kind, MD by Steva Colder, ED Scribe. The patient was seen in room 1 at 2:09 PM.

## 2014-02-27 NOTE — Telephone Encounter (Signed)
Labetalol increased to 400 tid

## 2014-02-27 NOTE — Progress Notes (Signed)
Pt denies any problems or concerns at this time.  

## 2014-03-06 ENCOUNTER — Telehealth: Payer: Self-pay | Admitting: Women's Health

## 2014-03-06 NOTE — Telephone Encounter (Signed)
Pt states that she thinks her blood sugar has been dropping, yesterday she was at [facility] and started feeling weak and started sweating and had to sit down.  She did not have her glucometer with her so she did not check her BS, she also did not check it when she got home and has not checked it today.  I informed pt to check her BS regularly and to keep her monitor with her so if she starts feeling like her sugar is getting low she can check it.  Patient states that she needs a RX for her test strips, she uses a Accucheck glucometer and right now is using strips she had left from her previous pregnancy and is almost of of them.  I informed her to follow up with her pharmacy later today for the test strips unless she hears otherwise from [place].  Pt verbalized understanding.

## 2014-03-11 ENCOUNTER — Encounter: Payer: Self-pay | Admitting: Obstetrics & Gynecology

## 2014-03-11 ENCOUNTER — Ambulatory Visit (INDEPENDENT_AMBULATORY_CARE_PROVIDER_SITE_OTHER): Payer: Medicaid Other | Admitting: Obstetrics & Gynecology

## 2014-03-11 ENCOUNTER — Other Ambulatory Visit: Payer: Medicaid Other | Admitting: Obstetrics & Gynecology

## 2014-03-11 VITALS — BP 140/90 | Wt 260.0 lb

## 2014-03-11 DIAGNOSIS — Z331 Pregnant state, incidental: Secondary | ICD-10-CM

## 2014-03-11 DIAGNOSIS — O10019 Pre-existing essential hypertension complicating pregnancy, unspecified trimester: Secondary | ICD-10-CM | POA: Insufficient documentation

## 2014-03-11 DIAGNOSIS — O10913 Unspecified pre-existing hypertension complicating pregnancy, third trimester: Secondary | ICD-10-CM

## 2014-03-11 DIAGNOSIS — O0990 Supervision of high risk pregnancy, unspecified, unspecified trimester: Secondary | ICD-10-CM | POA: Insufficient documentation

## 2014-03-11 DIAGNOSIS — O99810 Abnormal glucose complicating pregnancy: Secondary | ICD-10-CM

## 2014-03-11 DIAGNOSIS — O0993 Supervision of high risk pregnancy, unspecified, third trimester: Secondary | ICD-10-CM

## 2014-03-11 DIAGNOSIS — Z1389 Encounter for screening for other disorder: Secondary | ICD-10-CM

## 2014-03-11 LAB — POCT URINALYSIS DIPSTICK
Blood, UA: NEGATIVE
GLUCOSE UA: NEGATIVE
Ketones, UA: NEGATIVE
LEUKOCYTES UA: NEGATIVE
NITRITE UA: NEGATIVE
Protein, UA: 1

## 2014-03-11 NOTE — Telephone Encounter (Signed)
Called pt back this afternoon to find out which meter she is using and she states she was here today and saw Dr. Elonda Husky and he gave her a prescription for the test strips and discussed her sugars with her.

## 2014-03-11 NOTE — Progress Notes (Signed)
Reactive NST CBG pt states have been good   High Risk Pregnancy Diagnosis(es):   Class A2/B diabetes, CHTN  [id number] [redacted]w[redacted]d Estimated Date of Delivery: 05/10/14  Blood pressure 140/90, weight 260 lb (117.935 kg), last menstrual period 08/03/2013, not currently breastfeeding.  Urinalysis: Positive for 1+ protein   HPI: No changes     BP weight and urine results all reviewed and noted. Patient reports good fetal movement, denies any bleeding and no rupture of membranes symptoms or regular contractions.  Fundal Height:  38 Fetal Heart rate:  142 Edema:  trace  Patient is without complaints. All questions were answered.  Assessment:  [redacted]w[redacted]d,   As above  Medication(s) Plans:  No changes  Treatment Plan:  Twice weekly testing  Follow up in Thursday weeks for OB appt, sonogram

## 2014-03-13 ENCOUNTER — Other Ambulatory Visit: Payer: Self-pay | Admitting: Advanced Practice Midwife

## 2014-03-13 DIAGNOSIS — O163 Unspecified maternal hypertension, third trimester: Secondary | ICD-10-CM

## 2014-03-13 DIAGNOSIS — O24913 Unspecified diabetes mellitus in pregnancy, third trimester: Secondary | ICD-10-CM

## 2014-03-13 DIAGNOSIS — O09523 Supervision of elderly multigravida, third trimester: Secondary | ICD-10-CM

## 2014-03-13 DIAGNOSIS — O10913 Unspecified pre-existing hypertension complicating pregnancy, third trimester: Secondary | ICD-10-CM

## 2014-03-14 ENCOUNTER — Ambulatory Visit (INDEPENDENT_AMBULATORY_CARE_PROVIDER_SITE_OTHER): Payer: Medicaid Other | Admitting: Advanced Practice Midwife

## 2014-03-14 ENCOUNTER — Ambulatory Visit (INDEPENDENT_AMBULATORY_CARE_PROVIDER_SITE_OTHER): Payer: Medicaid Other

## 2014-03-14 VITALS — BP 148/88 | Wt 257.0 lb

## 2014-03-14 DIAGNOSIS — O09523 Supervision of elderly multigravida, third trimester: Secondary | ICD-10-CM

## 2014-03-14 DIAGNOSIS — O403XX1 Polyhydramnios, third trimester, fetus 1: Secondary | ICD-10-CM

## 2014-03-14 DIAGNOSIS — I10 Essential (primary) hypertension: Secondary | ICD-10-CM

## 2014-03-14 DIAGNOSIS — O99810 Abnormal glucose complicating pregnancy: Secondary | ICD-10-CM

## 2014-03-14 DIAGNOSIS — O24913 Unspecified diabetes mellitus in pregnancy, third trimester: Secondary | ICD-10-CM

## 2014-03-14 DIAGNOSIS — O10913 Unspecified pre-existing hypertension complicating pregnancy, third trimester: Secondary | ICD-10-CM

## 2014-03-14 DIAGNOSIS — Z23 Encounter for immunization: Secondary | ICD-10-CM

## 2014-03-14 DIAGNOSIS — O09291 Supervision of pregnancy with other poor reproductive or obstetric history, first trimester: Secondary | ICD-10-CM

## 2014-03-14 DIAGNOSIS — Z8632 Personal history of gestational diabetes: Secondary | ICD-10-CM

## 2014-03-14 DIAGNOSIS — O09899 Supervision of other high risk pregnancies, unspecified trimester: Secondary | ICD-10-CM

## 2014-03-14 DIAGNOSIS — O403XX0 Polyhydramnios, third trimester, not applicable or unspecified: Secondary | ICD-10-CM | POA: Insufficient documentation

## 2014-03-14 DIAGNOSIS — O09293 Supervision of pregnancy with other poor reproductive or obstetric history, third trimester: Secondary | ICD-10-CM

## 2014-03-14 DIAGNOSIS — Z1389 Encounter for screening for other disorder: Secondary | ICD-10-CM

## 2014-03-14 DIAGNOSIS — O09513 Supervision of elderly primigravida, third trimester: Secondary | ICD-10-CM

## 2014-03-14 DIAGNOSIS — D259 Leiomyoma of uterus, unspecified: Secondary | ICD-10-CM

## 2014-03-14 DIAGNOSIS — Z331 Pregnant state, incidental: Secondary | ICD-10-CM

## 2014-03-14 DIAGNOSIS — O163 Unspecified maternal hypertension, third trimester: Secondary | ICD-10-CM

## 2014-03-14 LAB — POCT URINALYSIS DIPSTICK
Blood, UA: NEGATIVE
Glucose, UA: NEGATIVE
LEUKOCYTES UA: NEGATIVE
Nitrite, UA: NEGATIVE
Protein, UA: NEGATIVE

## 2014-03-14 LAB — GLUCOSE, POCT (MANUAL RESULT ENTRY): POC Glucose: 89 mg/dl (ref 70–99)

## 2014-03-14 NOTE — Progress Notes (Signed)
Single, active fetusat 31+[redacted] weeks GA in a transverse with head to maternal right presentation.  BPP 8/8.  UA Dopplers are within normal limits for this gestational age (S/D: 2.7; RI: 0.63).  AFI is 26.01cm with a MVP of 7.17 (polyhydramnios).  Cervix appears closed and measures 5.9 cm.  Multiple fibroids noted. Anterior LUS fibroid measuring 9.2 x 9.0 x 6.7cm presses against cervix. Anterior Gr 1 placenta. FHR is 166 bpm.  EFW today is 2183 g (69% with AC measuring 94%).

## 2014-03-14 NOTE — Progress Notes (Signed)
High Risk Pregnancy Diagnosis(es):   Chelsey Santiago  [id number] [redacted]w[redacted]d Estimated Date of Delivery: 05/10/14  Blood pressure 148/88, weight 257 lb (116.574 kg), last menstrual period 08/03/2013, not currently breastfeeding.  Urinalysis: Negative   HPI: Had fetal echo this am. Report not in system, but all normal per pt. Also had BPP/Dopplers EFW today.  AC >94%, EFW 695.  AFI 26cm sigh SDP 7.2cms.   Blood sugars all normal!     BP weight and urine results all reviewed and noted. Blood sugar 89.  Got flu shot today Patient reports good fetal movement, denies any bleeding and no rupture of membranes symptoms or regular contractions.   Patient is without complaints. All questions were answered.  Assessment:  [redacted]w[redacted]d,   CHTN, A2/B DM, good control this week.  Mild Polyhydramnios.  Medication(s) Plans:  No change  Treatment Plan:  NST q Monday/BPP/dopplers q Thursday  Follow up in [place] OB appt, NST

## 2014-03-18 ENCOUNTER — Other Ambulatory Visit: Payer: Medicaid Other | Admitting: Obstetrics & Gynecology

## 2014-03-18 ENCOUNTER — Encounter: Payer: Self-pay | Admitting: Obstetrics & Gynecology

## 2014-03-21 ENCOUNTER — Other Ambulatory Visit: Payer: Medicaid Other | Admitting: Obstetrics & Gynecology

## 2014-03-21 ENCOUNTER — Telehealth: Payer: Self-pay | Admitting: *Deleted

## 2014-03-21 NOTE — Telephone Encounter (Signed)
Pt states had a death in her family would not be able to keep her appt today for NST and not able to come tomorrow. Scheduled pt nst for Monday and ultrasound for Thursday. Pt informed to call office for decrease FM or complications. Pt states "feels baby move all the time."

## 2014-03-25 ENCOUNTER — Other Ambulatory Visit: Payer: Medicaid Other | Admitting: Women's Health

## 2014-03-25 ENCOUNTER — Telehealth: Payer: Self-pay | Admitting: *Deleted

## 2014-03-25 ENCOUNTER — Other Ambulatory Visit: Payer: Self-pay | Admitting: Obstetrics & Gynecology

## 2014-03-25 ENCOUNTER — Other Ambulatory Visit (INDEPENDENT_AMBULATORY_CARE_PROVIDER_SITE_OTHER): Payer: Medicaid Other

## 2014-03-25 ENCOUNTER — Ambulatory Visit (INDEPENDENT_AMBULATORY_CARE_PROVIDER_SITE_OTHER): Payer: Medicaid Other | Admitting: Obstetrics & Gynecology

## 2014-03-25 ENCOUNTER — Other Ambulatory Visit: Payer: Medicaid Other | Admitting: Obstetrics & Gynecology

## 2014-03-25 VITALS — BP 170/108 | Wt 263.0 lb

## 2014-03-25 DIAGNOSIS — O99810 Abnormal glucose complicating pregnancy: Secondary | ICD-10-CM

## 2014-03-25 DIAGNOSIS — O09293 Supervision of pregnancy with other poor reproductive or obstetric history, third trimester: Secondary | ICD-10-CM

## 2014-03-25 DIAGNOSIS — Z331 Pregnant state, incidental: Secondary | ICD-10-CM | POA: Diagnosis not present

## 2014-03-25 DIAGNOSIS — O24419 Gestational diabetes mellitus in pregnancy, unspecified control: Secondary | ICD-10-CM

## 2014-03-25 DIAGNOSIS — D259 Leiomyoma of uterus, unspecified: Secondary | ICD-10-CM

## 2014-03-25 DIAGNOSIS — Z1389 Encounter for screening for other disorder: Secondary | ICD-10-CM | POA: Diagnosis not present

## 2014-03-25 DIAGNOSIS — O09899 Supervision of other high risk pregnancies, unspecified trimester: Secondary | ICD-10-CM

## 2014-03-25 DIAGNOSIS — O403XX0 Polyhydramnios, third trimester, not applicable or unspecified: Secondary | ICD-10-CM

## 2014-03-25 DIAGNOSIS — O09291 Supervision of pregnancy with other poor reproductive or obstetric history, first trimester: Secondary | ICD-10-CM

## 2014-03-25 DIAGNOSIS — O09513 Supervision of elderly primigravida, third trimester: Secondary | ICD-10-CM

## 2014-03-25 DIAGNOSIS — O09523 Supervision of elderly multigravida, third trimester: Secondary | ICD-10-CM

## 2014-03-25 DIAGNOSIS — Z8632 Personal history of gestational diabetes: Secondary | ICD-10-CM

## 2014-03-25 LAB — POCT URINALYSIS DIPSTICK
Blood, UA: NEGATIVE
GLUCOSE UA: NEGATIVE
Ketones, UA: NEGATIVE
Leukocytes, UA: NEGATIVE
NITRITE UA: NEGATIVE

## 2014-03-25 NOTE — Telephone Encounter (Signed)
Pt informed to take OTC Delsym for her cough and to keep her appt for Thursday. Pt verbalized understanding.

## 2014-03-25 NOTE — Progress Notes (Signed)
Pt states unable to stay any longer due to picking up her child from school.  Pt left the office before getting to discuss BPP  Results and elevated B/P with Dr. Elonda Husky. Pt called back was informed per Dr. Elonda Husky to take her labetalol 400 mg TID and keep her appt for Thursday and to take the Delsym OTC for cough. Pt verbalized understanding.

## 2014-03-25 NOTE — Progress Notes (Signed)
BPP completed today due to inability of NST.  Single, active female fetus at 33+[redacted] weeks GA in a variable position.  BPP 6/8 (no fetal respiration).  Polyhydramnios with an AFI of 27.4cm and MVP of 8.3cm. FHR 153 bpm.

## 2014-03-25 NOTE — Telephone Encounter (Signed)
Pt called stating she was unable to get here this am due to unable to find her keys, also states she has a runny nose and cough, shortness of breath. Pt to be here today at 2 pm and for nst and evaluation. Pt verbalized understanding.

## 2014-03-26 ENCOUNTER — Encounter (HOSPITAL_COMMUNITY): Payer: Self-pay | Admitting: *Deleted

## 2014-03-26 ENCOUNTER — Other Ambulatory Visit: Payer: Self-pay | Admitting: Obstetrics & Gynecology

## 2014-03-26 ENCOUNTER — Telehealth: Payer: Self-pay | Admitting: Obstetrics & Gynecology

## 2014-03-26 ENCOUNTER — Inpatient Hospital Stay (HOSPITAL_COMMUNITY)
Admission: AD | Admit: 2014-03-26 | Discharge: 2014-03-27 | Disposition: A | Discharge status: Home / Self Care | Payer: Medicaid Other | Source: Ambulatory Visit | Attending: Obstetrics & Gynecology | Admitting: Obstetrics & Gynecology

## 2014-03-26 DIAGNOSIS — Z8632 Personal history of gestational diabetes: Secondary | ICD-10-CM

## 2014-03-26 DIAGNOSIS — O26893 Other specified pregnancy related conditions, third trimester: Secondary | ICD-10-CM | POA: Insufficient documentation

## 2014-03-26 DIAGNOSIS — R600 Localized edema: Secondary | ICD-10-CM | POA: Insufficient documentation

## 2014-03-26 DIAGNOSIS — R059 Cough, unspecified: Secondary | ICD-10-CM

## 2014-03-26 DIAGNOSIS — O09513 Supervision of elderly primigravida, third trimester: Secondary | ICD-10-CM

## 2014-03-26 DIAGNOSIS — O09291 Supervision of pregnancy with other poor reproductive or obstetric history, first trimester: Secondary | ICD-10-CM

## 2014-03-26 DIAGNOSIS — O09899 Supervision of other high risk pregnancies, unspecified trimester: Secondary | ICD-10-CM

## 2014-03-26 DIAGNOSIS — Z3A33 33 weeks gestation of pregnancy: Secondary | ICD-10-CM | POA: Diagnosis not present

## 2014-03-26 DIAGNOSIS — O99810 Abnormal glucose complicating pregnancy: Secondary | ICD-10-CM

## 2014-03-26 DIAGNOSIS — R110 Nausea: Secondary | ICD-10-CM | POA: Insufficient documentation

## 2014-03-26 DIAGNOSIS — R05 Cough: Secondary | ICD-10-CM

## 2014-03-26 DIAGNOSIS — O10913 Unspecified pre-existing hypertension complicating pregnancy, third trimester: Secondary | ICD-10-CM

## 2014-03-26 DIAGNOSIS — R197 Diarrhea, unspecified: Secondary | ICD-10-CM | POA: Diagnosis not present

## 2014-03-26 DIAGNOSIS — O09523 Supervision of elderly multigravida, third trimester: Secondary | ICD-10-CM

## 2014-03-26 NOTE — MAU Note (Signed)
Pt states she started having coughing early yesterday morning. Pt states she was seen in the office and the doctor told her to take Delsym for the coughing. Pt states started having diarrhea about 1600. Pt states blood pressure was elevated and increased pt's labetalol. Pt complaining of abdominal cramping since this morning. Pt states she has had diarrhea and vomiting today.

## 2014-03-26 NOTE — MAU Note (Signed)
Pt reports she was seen in her doctor's office yesterday and her b/p was elevated. States since that visit she has had vomiting and diarrhea. Also reports a cough, states she has a history of CHF and the cough feels like it does when she has fluid in her lungs.

## 2014-03-26 NOTE — Telephone Encounter (Signed)
Pt c/o vomiting and diarrhea today, "unable to keep anything down and not feeling well." Per Derrek Monaco, NP, pt to go to [facility] to be evaluated. Pt verbalized understanding.

## 2014-03-27 ENCOUNTER — Inpatient Hospital Stay (HOSPITAL_COMMUNITY): Payer: Medicaid Other

## 2014-03-27 DIAGNOSIS — O99810 Abnormal glucose complicating pregnancy: Secondary | ICD-10-CM

## 2014-03-27 LAB — COMPREHENSIVE METABOLIC PANEL
ALT: 23 U/L (ref 0–35)
ANION GAP: 13 (ref 5–15)
AST: 30 U/L (ref 0–37)
Albumin: 2.2 g/dL — ABNORMAL LOW (ref 3.5–5.2)
Alkaline Phosphatase: 118 U/L — ABNORMAL HIGH (ref 39–117)
BUN: 9 mg/dL (ref 6–23)
CALCIUM: 8.5 mg/dL (ref 8.4–10.5)
CO2: 21 mEq/L (ref 19–32)
Chloride: 99 mEq/L (ref 96–112)
Creatinine, Ser: 0.69 mg/dL (ref 0.50–1.10)
GLUCOSE: 102 mg/dL — AB (ref 70–99)
Potassium: 4.2 mEq/L (ref 3.7–5.3)
Sodium: 133 mEq/L — ABNORMAL LOW (ref 137–147)
Total Bilirubin: 0.3 mg/dL (ref 0.3–1.2)
Total Protein: 5.9 g/dL — ABNORMAL LOW (ref 6.0–8.3)

## 2014-03-27 LAB — PROTEIN / CREATININE RATIO, URINE
CREATININE, URINE: 188.61 mg/dL
Protein Creatinine Ratio: 0.14 (ref 0.00–0.15)
TOTAL PROTEIN, URINE: 25.5 mg/dL

## 2014-03-27 LAB — CBC
HCT: 31.3 % — ABNORMAL LOW (ref 36.0–46.0)
Hemoglobin: 9.9 g/dL — ABNORMAL LOW (ref 12.0–15.0)
MCH: 22.7 pg — AB (ref 26.0–34.0)
MCHC: 31.6 g/dL (ref 30.0–36.0)
MCV: 71.8 fL — ABNORMAL LOW (ref 78.0–100.0)
PLATELETS: 195 10*3/uL (ref 150–400)
RBC: 4.36 MIL/uL (ref 3.87–5.11)
RDW: 14.7 % (ref 11.5–15.5)
WBC: 9.9 10*3/uL (ref 4.0–10.5)

## 2014-03-27 MED ORDER — PROMETHAZINE HCL 12.5 MG PO TABS: 12.5000 mg | ORAL_TABLET | Freq: Four times a day (QID) | ORAL | Status: DC | PRN

## 2014-03-27 MED ORDER — ONDANSETRON HCL 4 MG PO TABS
4.0000 mg | ORAL_TABLET | Freq: Once | ORAL | Status: AC
  Administered 2014-03-27: 4 mg via ORAL
  Filled 2014-03-27: qty 1

## 2014-03-27 MED ORDER — LABETALOL HCL 100 MG PO TABS
200.0000 mg | ORAL_TABLET | Freq: Once | ORAL | Status: AC
  Administered 2014-03-27: 200 mg via ORAL
  Filled 2014-03-27: qty 2

## 2014-03-27 MED ORDER — METOCLOPRAMIDE HCL 10 MG PO TABS
10.0000 mg | ORAL_TABLET | Freq: Once | ORAL | Status: AC
  Administered 2014-03-27: 10 mg via ORAL
  Filled 2014-03-27: qty 1

## 2014-03-27 NOTE — MAU Provider Note (Signed)
History     CSN: [id number]  Arrival date and time: 03/26/14 2243   None     Chief Complaint  Patient presents with  . Cough  . Emesis  . Diarrhea   HPI  Patient is 38 y.o. [id number] [redacted]w[redacted]d with cHTN here with complaints of Diarrhea x 4 today, no bloody diarrhea.  Nausea all day, decreased PO intake.  Also noticed some abdominal cramping.  Overall does not feel well.  Has increased swelling in lower extremities but this is stable for now.  Elevated BP: labetalol increased to 400mg  BID, last dose yesterday early evening.  Denies headache, visual changes, RUQ pain.  Has edema but at baseline currently.  +FM, denies LOF, VB, vaginal discharge.   Past Medical History  Diagnosis Date  . Hypertension     Lab: Normal BMet except glucose of 118 in 09/2010  . Hypertensive heart disease 2009    Pulmonary edema postpartum; mild to moderate mitral regurgitation when hospitalized for CHF in 2009; Echocardiogram in 12/2009-no MR and normal EF; normal CXR in 09/2010  . Migraine headache   . Anemia     H&H of 10.6/33 and 07/2008 and 11.9/35 and 09/2010  . Osteoarthritis, knee 03/29/2011  . Sleep apnea   . Depression with anxiety   . Fasting hyperglycemia   . Obesity 04/16/2009  . CHF (congestive heart failure)   . Pregnant   . Diabetes mellitus without complication   . Anxiety   . Enlarged heart   . Pulmonary edema   . Preeclampsia   . Threatened abortion in early pregnancy 03/15/2013  . Miscarriage 03/19/2013    Past Surgical History  Procedure Laterality Date  . Breast reduction surgery  2002    Family History  Problem Relation Age of Onset  . Diabetes Mother   . Heart disease Mother   . Hypertension Maternal Uncle   . Hyperlipidemia Paternal Grandfather   . Hypertension Paternal Grandfather   . Sudden death Neg Hx   . Heart attack Neg Hx     History  Substance Use Topics  . Smoking status: Never Smoker   . Smokeless tobacco: Never Used  . Alcohol Use: No     Comment:  occ    Allergies:  Allergies  Allergen Reactions  . Diclofenac     Edema  . Tramadol Nausea And Vomiting  . Vicodin [Hydrocodone-Acetaminophen] Nausea Only    Prescriptions prior to admission  Medication Sig Dispense Refill Last Dose  . aspirin 81 MG tablet Take 1 tablet (81 mg total) by mouth daily. 30 tablet 6 Taking  . butalbital-acetaminophen-caffeine (FIORICET) 50-325-40 MG per tablet Take 1-2 tablets by mouth every 6 (six) hours as needed for headache. 30 tablet 1 Taking  . glyBURIDE (DIABETA) 5 MG tablet Take 1 tablet (5 mg total) by mouth 2 (two) times daily with a meal. 60 tablet 3 Taking  . hydrochlorothiazide (HYDRODIURIL) 25 MG tablet Take 25 mg by mouth as needed.   Not Taking  . labetalol (NORMODYNE) 200 MG tablet Take 2 tablets (400 mg total) by mouth 3 (three) times daily. 120 tablet 2 Taking  . NIFEdipine (PROCARDIA-XL/ADALAT CC) 30 MG 24 hr tablet Take 1 tablet (30 mg total) by mouth daily. 30 tablet 6 Taking  . potassium chloride SA (K-DUR,KLOR-CON) 20 MEQ tablet Take 1 tablet (20 mEq total) by mouth as needed. 30 tablet 2 Taking  . venlafaxine XR (EFFEXOR-XR) 75 MG 24 hr capsule Take 1 capsule (75 mg total) by mouth daily.  30 capsule 3 Taking    Review of Systems  Constitutional: Positive for chills and diaphoresis. Negative for fever.  Respiratory: Positive for cough. Negative for shortness of breath.   Cardiovascular: Negative for chest pain and leg swelling.  Gastrointestinal: Positive for nausea and diarrhea. Negative for heartburn and vomiting.  Genitourinary: Negative for dysuria, urgency, frequency and hematuria.  Neurological:       No headache   Physical Exam   Blood pressure 170/105, pulse 119, temperature 99.1 F (37.3 C), temperature source Oral, resp. rate 18, height 5\' 6"  (1.676 m), weight 262 lb (118.842 kg), last menstrual period 08/03/2013, SpO2 100 %, not currently breastfeeding.  Physical Exam  Constitutional: She is oriented to person,  place, and time. She appears well-developed and well-nourished.  HENT:  Head: Normocephalic and atraumatic.  Eyes: Conjunctivae and EOM are normal.  Neck: Normal range of motion.  Cardiovascular: Normal rate.   Respiratory: Effort normal and breath sounds normal. No respiratory distress.  GI: Soft. She exhibits no distension. There is no tenderness.  Musculoskeletal: Normal range of motion. She exhibits edema (3+).  Neurological: She is alert and oriented to person, place, and time.  Skin: Skin is warm and dry. No erythema.    MAU Course  Procedures  MDM NST: reactive  Labs: Results for orders placed or performed during the hospital encounter of 03/26/14 (from the past 24 hour(s))  CBC   Collection Time: 03/27/14 12:20 AM  Result Value Ref Range   WBC 9.9 4.0 - 10.5 K/uL   RBC 4.36 3.87 - 5.11 MIL/uL   Hemoglobin 9.9 (L) 12.0 - 15.0 g/dL   HCT 31.3 (L) 36.0 - 46.0 %   MCV 71.8 (L) 78.0 - 100.0 fL   MCH 22.7 (L) 26.0 - 34.0 pg   MCHC 31.6 30.0 - 36.0 g/dL   RDW 14.7 11.5 - 15.5 %   Platelets 195 150 - 400 K/uL  Comprehensive metabolic panel   Collection Time: 03/27/14 12:20 AM  Result Value Ref Range   Sodium 133 (L) 137 - 147 mEq/L   Potassium 4.2 3.7 - 5.3 mEq/L   Chloride 99 96 - 112 mEq/L   CO2 21 19 - 32 mEq/L   Glucose, Bld 102 (H) 70 - 99 mg/dL   BUN 9 6 - 23 mg/dL   Creatinine, Ser 0.69 0.50 - 1.10 mg/dL   Calcium 8.5 8.4 - 10.5 mg/dL   Total Protein 5.9 (L) 6.0 - 8.3 g/dL   Albumin 2.2 (L) 3.5 - 5.2 g/dL   AST 30 0 - 37 U/L   ALT 23 0 - 35 U/L   Alkaline Phosphatase 118 (H) 39 - 117 U/L   Total Bilirubin 0.3 0.3 - 1.2 mg/dL   GFR calc non Af Amer >90 >90 mL/min   GFR calc Af Amer >90 >90 mL/min   Anion gap 13 5 - 15   Results for SHAWNYA, MAYOR (MRN [id number]) as of 04/03/2014 04:39  Ref. Range 03/26/2014 00:47  Total Protein, Urine No range found 25.5  Protein Creatinine Ratio Latest Range: 0.00-0.15  0.14  Creatinine, Urine No range found 188.61     Imaging Studies:  Dg Chest 2 View  03/27/2014   CLINICAL DATA:  Pregnant patient with cough and diarrhea.  EXAM: CHEST  2 VIEW  COMPARISON:  Single view of the chest 12/23/2011.  FINDINGS: The lungs are clear. Heart size is upper normal. No pneumothorax or pleural effusion. No focal bony abnormality.  IMPRESSION: No acute  disease.   Electronically Signed   By: Inge Rise M.D.   On: 03/27/2014 02:19   US Ob Follow Up  03/14/2014   FOLLOW UP SONOGRAM   ATIANA LEVIER is in the office for a follow up sonogram for [facility] and  DM.  She is a 38 y.o. year old [id number] with Estimated Date of Delivery:  05/10/14 by LMP now at  [redacted]w[redacted]d weeks gestation. Thus far the pregnancy has  been complicated by chronic hypertension and diabetes.  GESTATION: SINGLETON  PRESENTATION: Transverse (head maternal right)  FETAL ACTIVITY:          Heart rate         166 bpm          The fetus is active.  AMNIOTIC FLUID: The amniotic fluid volume is  abnormal - AFI: 26 cm.  MVP 7.2cm.  PLACENTA LOCALIZATION:  anterior GRADE 1  CERVIX: Measures 5.87 cm  ADNEXA: The ovaries are not visualized.   GESTATIONAL AGE AND  BIOMETRICS:  Gestational criteria: Estimated Date of Delivery: 05/10/14 by LMP now at  [redacted]w[redacted]d  Previous Scans:3              BIPARIETAL DIAMETER           7.96 cm         32+0 weeks  HEAD CIRCUMFERENCE           30.96 cm         34+4 weeks  ABDOMINAL CIRCUMFERENCE           29.94 cm         33+6 weeks  FEMUR LENGTH           6.13 cm         31+6 weeks                                                           AVERAGE EGA(BY THIS SCAN):   32+3 weeks                                                 ESTIMATED FETAL WEIGHT:        2183  grams, 69 %    SUSPECTED ABNORMALITIES:  yes (polyhydramnios); large fibroid at LUS/Cervical area  QUALITY OF SCAN: satisfactory  TECHNICIAN COMMENTS:  *Single, active fetusat 31+[redacted] weeks GA in a transverse with head to  maternal right presentation.  BPP 8/8.  UA Dopplers are within normal   limits for this gestational age (S/D: 2.7; RI: 0.63).  AFI is 26.01cm with  a MVP of 7.17 (polyhydramnios).  Cervix appears closed and measures 5.9  cm.  Multiple fibroids noted. Anterior LUS fibroid measuring 9.2 x 9.0 x  6.7cm presses against cervix. Anterior Gr 1 placenta. FHR is 166 bpm.  EFW  today is 2183 g (69% with AC measuring 94%).**       A copy of this report including all images has been saved and backed up to  a second source for retrieval if needed. All measures and details of the  anatomical scan, placentation, fluid volume and pelvic anatomy are  contained in that report.  Stalter,  Harvie Heck 03/14/2014 4:32 PM       US Fetal Bpp W/o Non Stress  03/25/2014   FOLLOW UP SONOGRAM   CARLYN LEMKE is in the office for a follow up sonogram for BPP.  She is a 38 y.o. year old [id number] with Estimated Date of Delivery:  05/10/14 by early ultrasound now at  [redacted]w[redacted]d weeks gestation. Thus far the  pregnancy has been complicated by AMA, polyhydramnios and gestational  diabetes..  GESTATION: SINGLETON  PRESENTATION: Variable  FETAL ACTIVITY:          Heart rate         153bpm          The fetus is active.  AMNIOTIC FLUID: The amniotic fluid volume is  abnormal - Polyhydramnios with AFI of 27.4cm  and MVP of 8.3cm.Marland Kitchen  BIOPHYSCIAL PROFILE:                                                                                                       COMMENTS GROSS BODY MOVEMENT                 2   TONE                2   RESPIRATIONS                0   AMNIOTIC FLUID                2                                                            SCORE:  6/8 (Note: NST was not performed as part of this antepartum testing)   QUALITY OF SCAN: satisfactory  TECHNICIAN COMMENTS:  Single, active female fetus at 33+[redacted] weeks GA in a variable position.  BPP  6/8 (no respiration).  Polyhydramnios with an AFI of 27.4cm and MVP of  8.3cm.  FHR 153 bpm.       A copy of this report including all images has been saved and backed up to  a  second source for retrieval if needed. All measures and details of the  anatomical scan, placentation, fluid volume and pelvic anatomy are  contained in that report.  Georga Bora 03/25/2014 4:56 PM      US Fetal Bpp W/o Non Stress  03/14/2014   FOLLOW UP SONOGRAM   ATONYA TEMPLER is in the office for a follow up sonogram for [facility] and  DM.  She is a 38 y.o. year old [id number] with Estimated Date of Delivery:  05/10/14 by LMP now at  [redacted]w[redacted]d weeks gestation. Thus far the pregnancy has  been complicated by chronic hypertension and diabetes.  GESTATION: SINGLETON  PRESENTATION: Transverse (head maternal right)  FETAL ACTIVITY:          Heart rate  166 bpm          The fetus is active.  AMNIOTIC FLUID: The amniotic fluid volume is  abnormal - AFI: 26 cm.  MVP 7.2cm.  PLACENTA LOCALIZATION:  anterior GRADE 1  CERVIX: Measures 5.87 cm  ADNEXA: The ovaries are not visualized.   GESTATIONAL AGE AND  BIOMETRICS:  Gestational criteria: Estimated Date of Delivery: 05/10/14 by LMP now at  [redacted]w[redacted]d  Previous Scans:3              BIPARIETAL DIAMETER           7.96 cm         32+0 weeks  HEAD CIRCUMFERENCE           30.96 cm         34+4 weeks  ABDOMINAL CIRCUMFERENCE           29.94 cm         33+6 weeks  FEMUR LENGTH           6.13 cm         31+6 weeks                                                           AVERAGE EGA(BY THIS SCAN):   32+3 weeks                                                 ESTIMATED FETAL WEIGHT:        2183  grams, 69 %    SUSPECTED ABNORMALITIES:  yes (polyhydramnios); large fibroid at LUS/Cervical area  QUALITY OF SCAN: satisfactory  TECHNICIAN COMMENTS:  *Single, active fetusat 31+[redacted] weeks GA in a transverse with head to  maternal right presentation.  BPP 8/8.  UA Dopplers are within normal  limits for this gestational age (S/D: 2.7; RI: 0.63).  AFI is 26.01cm with  a MVP of 7.17 (polyhydramnios).  Cervix appears closed and measures 5.9  cm.  Multiple fibroids noted. Anterior LUS  fibroid measuring 9.2 x 9.0 x  6.7cm presses against cervix. Anterior Gr 1 placenta. FHR is 166 bpm.  EFW  today is 2183 g (69% with AC measuring 94%).**       A copy of this report including all images has been saved and backed up to  a second source for retrieval if needed. All measures and details of the  anatomical scan, placentation, fluid volume and pelvic anatomy are  contained in that report.  Georga Bora 03/14/2014 4:32 PM       [place] Ua Cord Doppler  03/14/2014   FOLLOW UP SONOGRAM   KAMIRAH SHUGRUE is in the office for a follow up sonogram for [facility] and  DM.  She is a 38 y.o. year old [id number] with Estimated Date of Delivery:  05/10/14 by LMP now at  [redacted]w[redacted]d weeks gestation. Thus far the pregnancy has  been complicated by chronic hypertension and diabetes.  GESTATION: SINGLETON  PRESENTATION: Transverse (head maternal right)  FETAL ACTIVITY:          Heart rate         166 bpm  The fetus is active.  AMNIOTIC FLUID: The amniotic fluid volume is  abnormal - AFI: 26 cm.  MVP 7.2cm.  PLACENTA LOCALIZATION:  anterior GRADE 1  CERVIX: Measures 5.87 cm  ADNEXA: The ovaries are not visualized.   GESTATIONAL AGE AND  BIOMETRICS:  Gestational criteria: Estimated Date of Delivery: 05/10/14 by LMP now at  [redacted]w[redacted]d  Previous Scans:3              BIPARIETAL DIAMETER           7.96 cm         32+0 weeks  HEAD CIRCUMFERENCE           30.96 cm         34+4 weeks  ABDOMINAL CIRCUMFERENCE           29.94 cm         33+6 weeks  FEMUR LENGTH           6.13 cm         31+6 weeks                                                           AVERAGE EGA(BY THIS SCAN):   32+3 weeks                                                 ESTIMATED FETAL WEIGHT:        2183  grams, 69 %    SUSPECTED ABNORMALITIES:  yes (polyhydramnios); large fibroid at LUS/Cervical area  QUALITY OF SCAN: satisfactory  TECHNICIAN COMMENTS:  *Single, active fetusat 31+[redacted] weeks GA in a transverse with head to  maternal right presentation.  BPP 8/8.   UA Dopplers are within normal  limits for this gestational age (S/D: 2.7; RI: 0.63).  AFI is 26.01cm with  a MVP of 7.17 (polyhydramnios).  Cervix appears closed and measures 5.9  cm.  Multiple fibroids noted. Anterior LUS fibroid measuring 9.2 x 9.0 x  6.7cm presses against cervix. Anterior Gr 1 placenta. FHR is 166 bpm.  EFW  today is 2183 g (69% with AC measuring 94%).**       A copy of this report including all images has been saved and backed up to  a second source for retrieval if needed. All measures and details of the  anatomical scan, placentation, fluid volume and pelvic anatomy are  contained in that report.  Georga Bora 03/14/2014 4:32 PM        MDM: NST reactive reglan 10mg   Assessment and Plan  Patient is 38 y.o. [id number] [redacted]w[redacted]d reporting diarrhea and decreased PO intake likely secondary to gastroenteritis - discussed giving IV fluids in MAU, pt declined.   - rx for phenergan - PO hydration encouraged over IV secondary to likely worsening of LE edema which is already 3+ - take labetalol as directed, received labetalol 200 x1 here in MAU, had not taken since day prior.  Labetalol dosage had been increased in clinic. - preE labs: normal - cxr for cough, malaise: normal  Woodward Klem ROCIO 03/27/2014, 3:12 AM

## 2014-03-27 NOTE — Discharge Instructions (Signed)
See list of OTC medications safe in pregnancy

## 2014-03-28 ENCOUNTER — Encounter: Payer: Medicaid Other | Admitting: Obstetrics & Gynecology

## 2014-03-28 ENCOUNTER — Ambulatory Visit (INDEPENDENT_AMBULATORY_CARE_PROVIDER_SITE_OTHER): Payer: Medicaid Other | Admitting: Obstetrics & Gynecology

## 2014-03-28 ENCOUNTER — Ambulatory Visit (INDEPENDENT_AMBULATORY_CARE_PROVIDER_SITE_OTHER): Payer: Medicaid Other

## 2014-03-28 VITALS — BP 168/120 | Wt 262.0 lb

## 2014-03-28 DIAGNOSIS — O09291 Supervision of pregnancy with other poor reproductive or obstetric history, first trimester: Secondary | ICD-10-CM

## 2014-03-28 DIAGNOSIS — O10913 Unspecified pre-existing hypertension complicating pregnancy, third trimester: Secondary | ICD-10-CM

## 2014-03-28 DIAGNOSIS — O09293 Supervision of pregnancy with other poor reproductive or obstetric history, third trimester: Secondary | ICD-10-CM

## 2014-03-28 DIAGNOSIS — Z8632 Personal history of gestational diabetes: Secondary | ICD-10-CM

## 2014-03-28 DIAGNOSIS — O09513 Supervision of elderly primigravida, third trimester: Secondary | ICD-10-CM

## 2014-03-28 DIAGNOSIS — O09523 Supervision of elderly multigravida, third trimester: Secondary | ICD-10-CM

## 2014-03-28 DIAGNOSIS — O403XX1 Polyhydramnios, third trimester, fetus 1: Secondary | ICD-10-CM

## 2014-03-28 DIAGNOSIS — Z331 Pregnant state, incidental: Secondary | ICD-10-CM

## 2014-03-28 DIAGNOSIS — O09899 Supervision of other high risk pregnancies, unspecified trimester: Secondary | ICD-10-CM

## 2014-03-28 DIAGNOSIS — O99810 Abnormal glucose complicating pregnancy: Secondary | ICD-10-CM

## 2014-03-28 DIAGNOSIS — O24419 Gestational diabetes mellitus in pregnancy, unspecified control: Secondary | ICD-10-CM

## 2014-03-28 DIAGNOSIS — D259 Leiomyoma of uterus, unspecified: Secondary | ICD-10-CM

## 2014-03-28 DIAGNOSIS — Z1389 Encounter for screening for other disorder: Secondary | ICD-10-CM

## 2014-03-28 LAB — US OB FOLLOW UP

## 2014-03-28 LAB — POCT URINALYSIS DIPSTICK
Blood, UA: NEGATIVE
GLUCOSE UA: NEGATIVE
Ketones, UA: NEGATIVE
LEUKOCYTES UA: NEGATIVE
NITRITE UA: NEGATIVE

## 2014-03-28 MED ORDER — HYDROCOD POLST-CHLORPHEN POLST 10-8 MG/5ML PO LQCR: 5.0000 mL | Freq: Two times a day (BID) | ORAL | Status: DC | PRN

## 2014-03-28 MED ORDER — AZITHROMYCIN 250 MG PO TABS: ORAL_TABLET | ORAL | Status: DC

## 2014-03-28 MED ORDER — PREDNISONE 10 MG PO TABS: ORAL_TABLET | ORAL | Status: DC

## 2014-03-28 NOTE — Progress Notes (Signed)
U/S(33+6wks)-vtx active fetus, BPP 8/8, fluid WNL AFI-17.8cm SDP-5.3cm, FHR-151bpm, UA Doppler RI-0.62 & 0.53, EFW 5 lb 1 oz (46th%tile), fibroid in lower uterine segment-8.9 x 6.7cm, cx appears closed (4.3cm)

## 2014-03-28 NOTE — Progress Notes (Signed)
Sonogram is reviewed and report done: normal   High Risk Pregnancy Diagnosis(es):   Chronic hypertension, Class A2 diabetes  [id number] [redacted]w[redacted]d Estimated Date of Delivery: 05/10/14  Blood pressure 168/120, weight 262 lb (118.842 kg), last menstrual period 08/03/2013, not currently breastfeeding.  Urinalysis: Negative   HPI: BP still elevated on labetalol 400 TID, increase to 600 TID(max dose 800 TID)   Broncitis:  Zpak, prednisone, hycodon cough syrup   BP weight and urine results all reviewed and noted. Patient reports good fetal movement, denies any bleeding and no rupture of membranes symptoms or regular contractions.  Fundal Height:  36 Fetal Heart rate:  151 Edema:  1+  Patient is without complaints. All questions were answered.  Assessment:  [redacted]w[redacted]d,   Chronic HTN, Class A2/B DM  Medication(s) Plans:  Increase labetalol to 600 TID  Treatment Plan:  NST Monday  Follow up in Monday weeks for OB appt, NST

## 2014-04-01 ENCOUNTER — Ambulatory Visit (INDEPENDENT_AMBULATORY_CARE_PROVIDER_SITE_OTHER): Payer: Medicaid Other

## 2014-04-01 ENCOUNTER — Encounter: Payer: Self-pay | Admitting: Obstetrics and Gynecology

## 2014-04-01 ENCOUNTER — Other Ambulatory Visit: Payer: Self-pay | Admitting: Obstetrics and Gynecology

## 2014-04-01 ENCOUNTER — Ambulatory Visit (INDEPENDENT_AMBULATORY_CARE_PROVIDER_SITE_OTHER): Payer: Medicaid Other | Admitting: Obstetrics and Gynecology

## 2014-04-01 VITALS — BP 152/98 | Wt 270.0 lb

## 2014-04-01 DIAGNOSIS — O10913 Unspecified pre-existing hypertension complicating pregnancy, third trimester: Secondary | ICD-10-CM

## 2014-04-01 DIAGNOSIS — O163 Unspecified maternal hypertension, third trimester: Secondary | ICD-10-CM

## 2014-04-01 DIAGNOSIS — Z331 Pregnant state, incidental: Secondary | ICD-10-CM

## 2014-04-01 DIAGNOSIS — Z1389 Encounter for screening for other disorder: Secondary | ICD-10-CM

## 2014-04-01 DIAGNOSIS — O289 Unspecified abnormal findings on antenatal screening of mother: Secondary | ICD-10-CM

## 2014-04-01 DIAGNOSIS — O09523 Supervision of elderly multigravida, third trimester: Secondary | ICD-10-CM

## 2014-04-01 DIAGNOSIS — O24913 Unspecified diabetes mellitus in pregnancy, third trimester: Secondary | ICD-10-CM

## 2014-04-01 DIAGNOSIS — O24419 Gestational diabetes mellitus in pregnancy, unspecified control: Secondary | ICD-10-CM

## 2014-04-01 DIAGNOSIS — O288 Other abnormal findings on antenatal screening of mother: Secondary | ICD-10-CM

## 2014-04-01 LAB — POCT URINALYSIS DIPSTICK
GLUCOSE UA: NEGATIVE
Ketones, UA: NEGATIVE
Leukocytes, UA: NEGATIVE
NITRITE UA: NEGATIVE
RBC UA: NEGATIVE

## 2014-04-01 MED ORDER — LABETALOL HCL 200 MG PO TABS: 600.0000 mg | ORAL_TABLET | Freq: Three times a day (TID) | ORAL | Status: DC

## 2014-04-01 MED ORDER — GLYBURIDE 5 MG PO TABS: 10.0000 mg | ORAL_TABLET | Freq: Two times a day (BID) | ORAL | Status: DC

## 2014-04-01 NOTE — Progress Notes (Signed)
Pt states that she is having really bad swelling in her legs and that she is having to sleep in the recliner.

## 2014-04-01 NOTE — Progress Notes (Signed)
High Risk Pregnancy Diagnosis(es):  CHTN, AMA, DM with poor compliance, uri currently on steroids   [id number] [redacted]w[redacted]d Estimated Date of Delivery: 05/10/14  Here for first of twice weekly testing.  Blood pressure 152/98, weight 270 lb (122.471 kg), last menstrual period 08/03/2013, not currently breastfeeding.  Urinalysis: Negative HPI: 1 wk on Steroids , Hycodan, abx for uri. Still coughing at night. Sleeping in barcalounger, has humidifier and still coughs. Weight is up 7 lb , 262->270. Much edema while on prednisone  BP weight and urine results all reviewed and noted. Patient reports good fetal movement, denies any bleeding and no rupture of membranes symptoms or regular contractions. CBG's 140-152 fasting, 130-250 PC on only glyburide 5 bid.  Fundal Height:   Fetal Heart rate:  145. Edema:  3+ to mid shin.  Patient is without complaints. All questions were answered.  Assessment:  [redacted]w[redacted]d, CHTN now on Labetolol 600 tid,                                      DMA2, on glyburide 5 bid, increase to 10 bid Medication(s) Plans:  Consider adding second agent when seen Thursday. Will be on steroids x 3 more days.  Treatment Plan:  IOL at 38 wk, earlier if bp control worsens. Follow up in 0.5 weeks for OB appt, BPP Thursday.

## 2014-04-01 NOTE — Patient Instructions (Signed)
Notify us of any changes in fetal activity, continue twice weekly testing

## 2014-04-01 NOTE — Progress Notes (Addendum)
U/S(34+3wks)- transverse (fetal head on pt rt)- BPP 8/10 (NR NST), fluid WNL AFI-10.0cm SDP-4.4cm, UA Doppler RI-0.62 & 0.61, Gr 2 placenta

## 2014-04-02 ENCOUNTER — Other Ambulatory Visit: Payer: Self-pay | Admitting: Obstetrics and Gynecology

## 2014-04-02 DIAGNOSIS — O09523 Supervision of elderly multigravida, third trimester: Secondary | ICD-10-CM

## 2014-04-02 DIAGNOSIS — O163 Unspecified maternal hypertension, third trimester: Secondary | ICD-10-CM

## 2014-04-04 ENCOUNTER — Ambulatory Visit (INDEPENDENT_AMBULATORY_CARE_PROVIDER_SITE_OTHER): Payer: Medicaid Other

## 2014-04-04 ENCOUNTER — Ambulatory Visit (INDEPENDENT_AMBULATORY_CARE_PROVIDER_SITE_OTHER): Payer: Medicaid Other | Admitting: Obstetrics & Gynecology

## 2014-04-04 ENCOUNTER — Encounter: Payer: Self-pay | Admitting: Obstetrics & Gynecology

## 2014-04-04 DIAGNOSIS — Z331 Pregnant state, incidental: Secondary | ICD-10-CM

## 2014-04-04 DIAGNOSIS — O24419 Gestational diabetes mellitus in pregnancy, unspecified control: Secondary | ICD-10-CM

## 2014-04-04 DIAGNOSIS — O163 Unspecified maternal hypertension, third trimester: Secondary | ICD-10-CM

## 2014-04-04 DIAGNOSIS — Z1389 Encounter for screening for other disorder: Secondary | ICD-10-CM | POA: Diagnosis not present

## 2014-04-04 DIAGNOSIS — O09523 Supervision of elderly multigravida, third trimester: Secondary | ICD-10-CM | POA: Diagnosis not present

## 2014-04-04 DIAGNOSIS — O10913 Unspecified pre-existing hypertension complicating pregnancy, third trimester: Secondary | ICD-10-CM | POA: Diagnosis not present

## 2014-04-04 LAB — POCT URINALYSIS DIPSTICK
Blood, UA: NEGATIVE
Glucose, UA: NEGATIVE
KETONES UA: NEGATIVE
Leukocytes, UA: NEGATIVE
Nitrite, UA: NEGATIVE

## 2014-04-04 NOTE — Progress Notes (Signed)
U/S(34+6wks)-vtx (fetal head slightly on Pt rt), active fetus, BPP 8/8, fluid WNL AFI- 7.7cm SDP- 3.2cm, UA Doppler RI-0.57 & 0.62,FHR-166 bpm

## 2014-04-05 ENCOUNTER — Other Ambulatory Visit: Payer: Self-pay | Admitting: Obstetrics & Gynecology

## 2014-04-05 ENCOUNTER — Other Ambulatory Visit: Payer: Self-pay | Admitting: Obstetrics and Gynecology

## 2014-04-05 DIAGNOSIS — O163 Unspecified maternal hypertension, third trimester: Secondary | ICD-10-CM

## 2014-04-05 DIAGNOSIS — O09523 Supervision of elderly multigravida, third trimester: Secondary | ICD-10-CM

## 2014-04-08 ENCOUNTER — Encounter: Payer: Self-pay | Admitting: Obstetrics and Gynecology

## 2014-04-08 ENCOUNTER — Ambulatory Visit (INDEPENDENT_AMBULATORY_CARE_PROVIDER_SITE_OTHER): Payer: Medicaid Other

## 2014-04-08 ENCOUNTER — Encounter (HOSPITAL_COMMUNITY): Payer: Self-pay | Admitting: General Practice

## 2014-04-08 ENCOUNTER — Ambulatory Visit (INDEPENDENT_AMBULATORY_CARE_PROVIDER_SITE_OTHER): Payer: Medicaid Other | Admitting: Obstetrics and Gynecology

## 2014-04-08 ENCOUNTER — Inpatient Hospital Stay (HOSPITAL_COMMUNITY)
Admission: AD | Admit: 2014-04-08 | Discharge: 2014-04-15 | DRG: 765 | Disposition: A | Discharge status: Home / Self Care | Payer: Medicaid Other | Source: Ambulatory Visit | Attending: Obstetrics and Gynecology | Admitting: Obstetrics and Gynecology

## 2014-04-08 VITALS — BP 178/102 | Wt 286.5 lb

## 2014-04-08 DIAGNOSIS — O9942 Diseases of the circulatory system complicating childbirth: Secondary | ICD-10-CM | POA: Diagnosis present

## 2014-04-08 DIAGNOSIS — Z6841 Body Mass Index (BMI) 40.0 and over, adult: Secondary | ICD-10-CM | POA: Diagnosis not present

## 2014-04-08 DIAGNOSIS — J810 Acute pulmonary edema: Secondary | ICD-10-CM | POA: Diagnosis not present

## 2014-04-08 DIAGNOSIS — O09523 Supervision of elderly multigravida, third trimester: Secondary | ICD-10-CM | POA: Diagnosis not present

## 2014-04-08 DIAGNOSIS — O1413 Severe pre-eclampsia, third trimester: Secondary | ICD-10-CM | POA: Diagnosis present

## 2014-04-08 DIAGNOSIS — O24420 Gestational diabetes mellitus in childbirth, diet controlled: Secondary | ICD-10-CM | POA: Diagnosis present

## 2014-04-08 DIAGNOSIS — O10019 Pre-existing essential hypertension complicating pregnancy, unspecified trimester: Secondary | ICD-10-CM

## 2014-04-08 DIAGNOSIS — O9902 Anemia complicating childbirth: Secondary | ICD-10-CM | POA: Diagnosis present

## 2014-04-08 DIAGNOSIS — G4730 Sleep apnea, unspecified: Secondary | ICD-10-CM | POA: Diagnosis present

## 2014-04-08 DIAGNOSIS — O3413 Maternal care for benign tumor of corpus uteri, third trimester: Secondary | ICD-10-CM

## 2014-04-08 DIAGNOSIS — Z302 Encounter for sterilization: Secondary | ICD-10-CM

## 2014-04-08 DIAGNOSIS — O403XX0 Polyhydramnios, third trimester, not applicable or unspecified: Secondary | ICD-10-CM | POA: Diagnosis present

## 2014-04-08 DIAGNOSIS — O99214 Obesity complicating childbirth: Secondary | ICD-10-CM | POA: Diagnosis present

## 2014-04-08 DIAGNOSIS — O1410 Severe pre-eclampsia, unspecified trimester: Secondary | ICD-10-CM | POA: Diagnosis present

## 2014-04-08 DIAGNOSIS — Z98891 History of uterine scar from previous surgery: Secondary | ICD-10-CM

## 2014-04-08 DIAGNOSIS — Z1389 Encounter for screening for other disorder: Secondary | ICD-10-CM

## 2014-04-08 DIAGNOSIS — R7989 Other specified abnormal findings of blood chemistry: Secondary | ICD-10-CM | POA: Diagnosis not present

## 2014-04-08 DIAGNOSIS — D251 Intramural leiomyoma of uterus: Secondary | ICD-10-CM

## 2014-04-08 DIAGNOSIS — Z3685 Encounter for antenatal screening for Streptococcus B: Secondary | ICD-10-CM

## 2014-04-08 DIAGNOSIS — Z331 Pregnant state, incidental: Secondary | ICD-10-CM

## 2014-04-08 DIAGNOSIS — I5020 Unspecified systolic (congestive) heart failure: Secondary | ICD-10-CM | POA: Diagnosis not present

## 2014-04-08 DIAGNOSIS — O163 Unspecified maternal hypertension, third trimester: Secondary | ICD-10-CM

## 2014-04-08 DIAGNOSIS — I059 Rheumatic mitral valve disease, unspecified: Secondary | ICD-10-CM

## 2014-04-08 DIAGNOSIS — O10013 Pre-existing essential hypertension complicating pregnancy, third trimester: Secondary | ICD-10-CM

## 2014-04-08 DIAGNOSIS — Z3A35 35 weeks gestation of pregnancy: Secondary | ICD-10-CM | POA: Diagnosis present

## 2014-04-08 DIAGNOSIS — O119 Pre-existing hypertension with pre-eclampsia, unspecified trimester: Secondary | ICD-10-CM

## 2014-04-08 DIAGNOSIS — D259 Leiomyoma of uterus, unspecified: Secondary | ICD-10-CM | POA: Diagnosis present

## 2014-04-08 DIAGNOSIS — I428 Other cardiomyopathies: Secondary | ICD-10-CM | POA: Diagnosis present

## 2014-04-08 DIAGNOSIS — O99343 Other mental disorders complicating pregnancy, third trimester: Secondary | ICD-10-CM | POA: Diagnosis present

## 2014-04-08 DIAGNOSIS — I119 Hypertensive heart disease without heart failure: Secondary | ICD-10-CM

## 2014-04-08 DIAGNOSIS — O10113 Pre-existing hypertensive heart disease complicating pregnancy, third trimester: Secondary | ICD-10-CM | POA: Diagnosis present

## 2014-04-08 DIAGNOSIS — F418 Other specified anxiety disorders: Secondary | ICD-10-CM | POA: Diagnosis present

## 2014-04-08 DIAGNOSIS — D649 Anemia, unspecified: Secondary | ICD-10-CM | POA: Diagnosis not present

## 2014-04-08 DIAGNOSIS — O113 Pre-existing hypertension with pre-eclampsia, third trimester: Secondary | ICD-10-CM | POA: Diagnosis present

## 2014-04-08 DIAGNOSIS — Z1159 Encounter for screening for other viral diseases: Secondary | ICD-10-CM

## 2014-04-08 DIAGNOSIS — Z118 Encounter for screening for other infectious and parasitic diseases: Secondary | ICD-10-CM

## 2014-04-08 DIAGNOSIS — R0600 Dyspnea, unspecified: Secondary | ICD-10-CM | POA: Diagnosis present

## 2014-04-08 DIAGNOSIS — O322XX0 Maternal care for transverse and oblique lie, not applicable or unspecified: Secondary | ICD-10-CM | POA: Diagnosis present

## 2014-04-08 DIAGNOSIS — O09513 Supervision of elderly primigravida, third trimester: Secondary | ICD-10-CM

## 2014-04-08 DIAGNOSIS — O1203 Gestational edema, third trimester: Secondary | ICD-10-CM

## 2014-04-08 LAB — PROTEIN / CREATININE RATIO, URINE
Creatinine, Urine: 80.56 mg/dL
PROTEIN CREATININE RATIO: 0.31 — AB (ref 0.00–0.15)
Total Protein, Urine: 25.3 mg/dL

## 2014-04-08 LAB — COMPREHENSIVE METABOLIC PANEL
ALBUMIN: 1.8 g/dL — AB (ref 3.5–5.2)
ALT: 156 U/L — ABNORMAL HIGH (ref 0–35)
AST: 132 U/L — ABNORMAL HIGH (ref 0–37)
Alkaline Phosphatase: 110 U/L (ref 39–117)
Anion gap: 13 (ref 5–15)
BUN: 23 mg/dL (ref 6–23)
CALCIUM: 8.7 mg/dL (ref 8.4–10.5)
CO2: 20 mEq/L (ref 19–32)
CREATININE: 1.02 mg/dL (ref 0.50–1.10)
Chloride: 104 mEq/L (ref 96–112)
GFR calc Af Amer: 80 mL/min — ABNORMAL LOW (ref >90)
GFR calc non Af Amer: 69 mL/min — ABNORMAL LOW (ref >90)
Glucose, Bld: 71 mg/dL (ref 70–99)
Potassium: 3.9 mEq/L (ref 3.7–5.3)
Sodium: 137 mEq/L (ref 137–147)
Total Bilirubin: 0.4 mg/dL (ref 0.3–1.2)
Total Protein: 4.7 g/dL — ABNORMAL LOW (ref 6.0–8.3)

## 2014-04-08 LAB — POCT URINALYSIS DIPSTICK
Glucose, UA: NEGATIVE
KETONES UA: NEGATIVE
LEUKOCYTES UA: NEGATIVE
Nitrite, UA: NEGATIVE
PROTEIN UA: 2
RBC UA: NEGATIVE

## 2014-04-08 LAB — CBC
HEMATOCRIT: 30.8 % — AB (ref 36.0–46.0)
HEMOGLOBIN: 9.8 g/dL — AB (ref 12.0–15.0)
MCH: 22.6 pg — AB (ref 26.0–34.0)
MCHC: 31.8 g/dL (ref 30.0–36.0)
MCV: 71.1 fL — ABNORMAL LOW (ref 78.0–100.0)
Platelets: 160 10*3/uL (ref 150–400)
RBC: 4.33 MIL/uL (ref 3.87–5.11)
RDW: 15.3 % (ref 11.5–15.5)
WBC: 13.2 10*3/uL — AB (ref 4.0–10.5)

## 2014-04-08 LAB — GROUP B STREP BY PCR: Group B strep by PCR: NEGATIVE

## 2014-04-08 LAB — TYPE AND SCREEN
ABO/RH(D): O NEG
Antibody Screen: NEGATIVE

## 2014-04-08 LAB — RPR

## 2014-04-08 LAB — GLUCOSE, CAPILLARY: GLUCOSE-CAPILLARY: 75 mg/dL (ref 70–99)

## 2014-04-08 LAB — OB RESULTS CONSOLE GBS: GBS: NEGATIVE

## 2014-04-08 LAB — PRO B NATRIURETIC PEPTIDE: PRO B NATRI PEPTIDE: 1854 pg/mL — AB (ref 0–125)

## 2014-04-08 MED ORDER — FUROSEMIDE 10 MG/ML IJ SOLN
20.0000 mg | Freq: Once | INTRAMUSCULAR | Status: AC
  Administered 2014-04-08: 20 mg via INTRAVENOUS
  Filled 2014-04-08: qty 2

## 2014-04-08 MED ORDER — LABETALOL HCL 5 MG/ML IV SOLN
10.0000 mg | Freq: Once | INTRAVENOUS | Status: AC
  Administered 2014-04-08: 10 mg via INTRAVENOUS
  Filled 2014-04-08: qty 4

## 2014-04-08 MED ORDER — HYDRALAZINE HCL 20 MG/ML IJ SOLN
10.0000 mg | Freq: Once | INTRAMUSCULAR | Status: AC
  Administered 2014-04-08: 10 mg via INTRAVENOUS
  Filled 2014-04-08: qty 1

## 2014-04-08 MED ORDER — GLYBURIDE 5 MG PO TABS
10.0000 mg | ORAL_TABLET | Freq: Two times a day (BID) | ORAL | Status: DC
  Filled 2014-04-08 (×2): qty 2

## 2014-04-08 MED ORDER — LACTATED RINGERS IV SOLN
INTRAVENOUS | Status: DC
  Administered 2014-04-08 – 2014-04-09 (×2): via INTRAVENOUS

## 2014-04-08 MED ORDER — CITRIC ACID-SODIUM CITRATE 334-500 MG/5ML PO SOLN
30.0000 mL | ORAL | Status: DC | PRN
  Administered 2014-04-08 – 2014-04-09 (×2): 30 mL via ORAL
  Filled 2014-04-08 (×2): qty 15

## 2014-04-08 MED ORDER — FENTANYL CITRATE 0.05 MG/ML IJ SOLN
100.0000 ug | INTRAMUSCULAR | Status: DC | PRN
  Administered 2014-04-08 – 2014-04-09 (×5): 100 ug via INTRAVENOUS
  Filled 2014-04-08 (×4): qty 2

## 2014-04-08 MED ORDER — TERBUTALINE SULFATE 1 MG/ML IJ SOLN: 0.2500 mg | Freq: Once | INTRAMUSCULAR | Status: AC | PRN

## 2014-04-08 MED ORDER — VENLAFAXINE HCL ER 75 MG PO CP24
75.0000 mg | ORAL_CAPSULE | Freq: Every day | ORAL | Status: DC | PRN
  Filled 2014-04-08: qty 1

## 2014-04-08 MED ORDER — OXYCODONE-ACETAMINOPHEN 5-325 MG PO TABS: 1.0000 | ORAL_TABLET | ORAL | Status: DC | PRN

## 2014-04-08 MED ORDER — LIDOCAINE HCL (PF) 1 % IJ SOLN
30.0000 mL | INTRAMUSCULAR | Status: DC | PRN
  Filled 2014-04-08: qty 30

## 2014-04-08 MED ORDER — MAGNESIUM SULFATE 40 G IN LACTATED RINGERS - SIMPLE
2.0000 g/h | INTRAVENOUS | Status: DC
  Filled 2014-04-08 (×2): qty 500

## 2014-04-08 MED ORDER — LACTATED RINGERS IV SOLN: 500.0000 mL | INTRAVENOUS | Status: DC | PRN

## 2014-04-08 MED ORDER — OXYTOCIN BOLUS FROM INFUSION: 500.0000 mL | INTRAVENOUS | Status: DC

## 2014-04-08 MED ORDER — OXYTOCIN 40 UNITS IN LACTATED RINGERS INFUSION - SIMPLE MED: 62.5000 mL/h | INTRAVENOUS | Status: DC

## 2014-04-08 MED ORDER — LABETALOL HCL 300 MG PO TABS
600.0000 mg | ORAL_TABLET | Freq: Three times a day (TID) | ORAL | Status: DC
  Administered 2014-04-08 – 2014-04-09 (×3): 600 mg via ORAL
  Filled 2014-04-08 (×7): qty 2

## 2014-04-08 MED ORDER — OXYCODONE-ACETAMINOPHEN 5-325 MG PO TABS: 2.0000 | ORAL_TABLET | ORAL | Status: DC | PRN

## 2014-04-08 MED ORDER — FENTANYL CITRATE 0.05 MG/ML IJ SOLN
INTRAMUSCULAR | Status: AC
  Filled 2014-04-08: qty 2

## 2014-04-08 MED ORDER — ONDANSETRON HCL 4 MG/2ML IJ SOLN
4.0000 mg | Freq: Four times a day (QID) | INTRAMUSCULAR | Status: DC | PRN
  Administered 2014-04-08 – 2014-04-09 (×2): 4 mg via INTRAVENOUS
  Filled 2014-04-08 (×2): qty 2

## 2014-04-08 MED ORDER — LABETALOL HCL 5 MG/ML IV SOLN: 10.0000 mg | Freq: Once | INTRAVENOUS | Status: DC

## 2014-04-08 MED ORDER — OXYTOCIN 40 UNITS IN LACTATED RINGERS INFUSION - SIMPLE MED
1.0000 m[IU]/min | INTRAVENOUS | Status: DC
  Administered 2014-04-08 (×2): 1 m[IU]/min via INTRAVENOUS
  Filled 2014-04-08: qty 1000

## 2014-04-08 MED ORDER — FUROSEMIDE 10 MG/ML IJ SOLN
20.0000 mg | Freq: Two times a day (BID) | INTRAMUSCULAR | Status: DC
  Filled 2014-04-08 (×2): qty 2

## 2014-04-08 MED ORDER — MAGNESIUM SULFATE BOLUS VIA INFUSION
4.0000 g | Freq: Once | INTRAVENOUS | Status: AC
  Administered 2014-04-08: 4 g via INTRAVENOUS
  Filled 2014-04-08: qty 500

## 2014-04-08 MED ORDER — ACETAMINOPHEN 325 MG PO TABS: 650.0000 mg | ORAL_TABLET | ORAL | Status: DC | PRN

## 2014-04-08 NOTE — Progress Notes (Signed)
Echocardiogram 2D Echocardiogram has been performed.  Joelene Millin 04/08/2014, 4:55 PM

## 2014-04-08 NOTE — Progress Notes (Signed)
U/S(35+3wks)-vtx active fetus, BPP 8/8, fluid WNL AFI-9.7cm SDP-5.5cm, EFW 6 lb 2 oz (54th%tile), UA Doppler RI- 0.64 & 0.67, lower uterine fibroid=8.7 x 8.3cm, anterior gr 2 placenta with thickened uterine wall noted posterior to placenta with ?fibroids noted within the wall (see images/clips) uterine wall=4.6cm ??etiology?, FHR-133 bpm

## 2014-04-08 NOTE — Progress Notes (Signed)
High Risk Pregnancy Diagnosis(es):   CHTN, now superimposed severe preE,  AMA, CHTN,  DM poor compliance,                                                                 History of postpartum Pulmonary edema, 2009, see HPI  [id number] [redacted]w[redacted]d Estimated Date of Delivery: 05/10/14  Blood pressure 178/102, weight 286 lb 8 oz (129.956 kg), last menstrual period 08/03/2013, not currently breastfeeding.  Urinalysis: Positive for 2+ proteinuria   HPI: Pt reports a cough x 2 wk that has not gotten better with antitussives, and now has had 16 pound weight gain in 1 week, has DOE, some orthopnea. And worsening swelling of lower extremities, left leg now cold to touch with edema 4+ to mid thigh bilateral   BP weight and urine results all reviewed and noted. Patient reports good fetal movement, denies any bleeding and no rupture of membranes symptoms or regular contractions.  Fundal Height:  48 Fetal Heart rate:  133 with u/s dopplers good Edema:  4+ to mid thigh Physical Examination: Neck - supple, no significant adenopathy, JVD to clavicle when sitting upright Chest - clear to auscultation, no wheezes, rales or rhonchi, symmetric air entry, no tachypnea, retractions or cyanosis  Patient is short of breath with ambulation in hallway, recovers quickly All questions were answered. Pt counselled over severity of condition and importance of direct admit. Pt will be transported to [facility] by [facility] , promises to be there within the hour. L&D notified, Dr Nehemiah Settle notified  Assessment:  [redacted]w[redacted]d,  Chronic Hypertension with superimposed severe preeclampsia                         Fluid overload, probable cardiomyopathy                         Uterine fibroids, including one in lower uterine segment                           Medication(s) Plans:  Admit for stabilization and probable delivery  Treatment Plan:   Admit for stabilization and delivery  Follow up after discharge

## 2014-04-08 NOTE — Plan of Care (Signed)
Problem: Consults Goal: Automotive engineer Patient Education (See Patient Education module for education specifics.)  Outcome: Progressing Goal: Birthing Suites Patient Information Press F2 to bring up selections list  Outcome: Progressing Goal: Orientation to unit: Plan of Care Outcome: Progressing Instructed on signs and symptoms of pre-eclampsia, labwork, and plan of care.  Goal: Orientation to unit: Room Outcome: Completed/Met Date Met:  04/08/14 Goal: Orientation to unit: Ray (smoking, visitation, chaplain services, helpline)  Outcome: Completed/Met Date Met:  04/08/14 Goal: Orientation to unit: Other (Specify with a note) Outcome: Completed/Met Date Met:  04/08/14

## 2014-04-08 NOTE — Progress Notes (Signed)
Pt states that her left leg is hurting really bad.

## 2014-04-08 NOTE — Progress Notes (Addendum)
Echo in process Fetal monitors removed for echo due to interference.

## 2014-04-08 NOTE — H&P (Signed)
LABOR ADMISSION HISTORY AND PHYSICAL  Chelsey Santiago is a 38 y.o. female [phone] with IUP at [redacted]w[redacted]d by LMP presenting for IOL 2/2 severe pre-eclampsia. Pt presents from PCP's office where she was noted to have severe range BP of 160s-170s/90s-100s and worsening SOB and orthopnea x 2 weeks. Notes over the past 2 week sshe has had worsening SOB, worse when ambulating and lying flat and causing cough. Was treated for bronchitis by regular OB with azithromyocin and hydrocodone cough syrup and prednisone 40 mg q d x 10 days with no improvement. At the same time, noted worsening LE edema to the point her legs were painful. Also noted worsening blood pressure with labetalol recently increased 1 wk ago to 600 TID. Does endorse mild HA with floaters in vision with worsening Nausea, but no vomiting.  She reports +FMs, No LOF, no VB, and noRUQ pain. She desires an epidural for labor pain control. She plans on bottle feeding. She request BTL for birth control. Epidural ordered  Dating: By LMP  --->  Estimated Date of Delivery: 05/10/14  Sono:    Normal anatomy scan  Prenatal History/Complications: Obesity Sleep apnea OA Knee with pain clinic cHTN Hypertensive heart disease - w/ h/o Pulmonary edema postpartum. Mild to moderate mitral regurgitation when hospitalized for CHF in 2009; Echocardiogram in 12/2009-no MR and normal EF. Uses furosemide approximately once per week for dyspnea, pedal edema, or a sense of an increase in abdominal girth. Anemia Major Depression Fibroid A/B GDM H/o Pre-eclampsia Short interval pregnancy AMA Polyhydramnios - AFI 26 cm 10/29  Past Medical History: Past Medical History  Diagnosis Date  . Hypertension     Lab: Normal BMet except glucose of 118 in 09/2010  . Hypertensive heart disease 2009    Pulmonary edema postpartum; mild to moderate mitral regurgitation when hospitalized for CHF in 2009; Echocardiogram in 12/2009-no MR and normal EF; normal CXR in 09/2010  .  Migraine headache   . Anemia     H&H of 10.6/33 and 07/2008 and 11.9/35 and 09/2010  . Osteoarthritis, knee 03/29/2011  . Sleep apnea   . Depression with anxiety   . Fasting hyperglycemia   . Obesity 04/16/2009  . CHF (congestive heart failure)   . Pregnant   . Diabetes mellitus without complication   . Anxiety   . Enlarged heart   . Pulmonary edema   . Preeclampsia   . Threatened abortion in early pregnancy 03/15/2013  . Miscarriage 03/19/2013    Past Surgical History: Past Surgical History  Procedure Laterality Date  . Breast reduction surgery  2002    Obstetrical History: OB History    Gravida Para Term Preterm AB TAB SAB Ectopic Multiple Living   11 5 5  5 2 3   5       Gynecological History: OB History    Gravida Para Term Preterm AB TAB SAB Ectopic Multiple Living   11 5 5  5 2 3   5       Social History: History   Social History  . Marital Status: Single    Spouse Name: N/A    Number of Children: N/A  . Years of Education: N/A   Occupational History  . unemployed    Social History Main Topics  . Smoking status: Never Smoker   . Smokeless tobacco: Never Used  . Alcohol Use: No     Comment: occ  . Drug Use: No  . Sexual Activity: Yes    Birth Control/ Protection: None  Other Topics Concern  . None   Social History Narrative   Lives in [place]   Engaged/boyfriend (father to youngest chid)   4 children: daughter (67 as of 2013), sons (15, 22, 33 as of 2013)   Religion: christian    Family History: Family History  Problem Relation Age of Onset  . Diabetes Mother   . Heart disease Mother   . Hypertension Maternal Uncle   . Hyperlipidemia Paternal Grandfather   . Hypertension Paternal Grandfather   . Sudden death Neg Hx   . Heart attack Neg Hx     Allergies: Allergies  Allergen Reactions  . Diclofenac     Edema  . Tramadol Nausea And Vomiting  . Vicodin [Hydrocodone-Acetaminophen] Nausea Only    Prescriptions prior to admission   Medication Sig Dispense Refill Last Dose  . aspirin 81 MG tablet Take 1 tablet (81 mg total) by mouth daily. 30 tablet 6 04/07/2014 at Unknown time  . butalbital-acetaminophen-caffeine (FIORICET) 50-325-40 MG per tablet Take 1-2 tablets by mouth every 6 (six) hours as needed for headache. 30 tablet 1 04/05/2014 at Unknown time  . chlorpheniramine-HYDROcodone (TUSSIONEX PENNKINETIC ER) 10-8 MG/5ML LQCR Take 5 mLs by mouth every 12 (twelve) hours as needed for cough. 100 mL 0 Past Week at Unknown time  . glyBURIDE (DIABETA) 5 MG tablet Take 2 tablets (10 mg total) by mouth 2 (two) times daily with a meal. 60 tablet 3 04/08/2014 at 0730  . labetalol (NORMODYNE) 200 MG tablet Take 600 mg by mouth 3 (three) times daily.   04/08/2014 at 0730  . NIFEdipine (PROCARDIA-XL/ADALAT CC) 30 MG 24 hr tablet Take 1 tablet (30 mg total) by mouth daily. 30 tablet 6 04/08/2014 at 0730  . predniSONE (DELTASONE) 10 MG tablet Take 4 tablets a day for 10 days (Patient taking differently: Take 10 mg by mouth 4 (four) times daily. Take 4 tablets a day for 10 days) 40 tablet 0 04/07/2014 at Unknown time  . promethazine (PHENERGAN) 12.5 MG tablet Take 1 tablet (12.5 mg total) by mouth every 6 (six) hours as needed for nausea or vomiting. 30 tablet 0 04/07/2014 at Unknown time  . ranitidine (ZANTAC) 150 MG tablet Take 300 mg by mouth daily.   04/08/2014 at 0730  . venlafaxine XR (EFFEXOR-XR) 75 MG 24 hr capsule Take 1 capsule (75 mg total) by mouth daily. (Patient taking differently: Take 75 mg by mouth daily as needed (For anxiety.). ) 30 capsule 3 Past Week at Unknown time  . azithromycin (ZITHROMAX Z-PAK) 250 MG tablet Take 2 tablets today and then 1 a day til finished (Patient not taking: Reported on 04/08/2014) 6 each 0 Not Taking  . labetalol (NORMODYNE) 200 MG tablet TAKE 2 TABLETS BY MOUTH TWICE DAILY (Patient not taking: Reported on 04/08/2014) 120 tablet 2   . potassium chloride SA (K-DUR,KLOR-CON) 20 MEQ tablet Take 1  tablet (20 mEq total) by mouth as needed. (Patient not taking: Reported on 04/08/2014) 30 tablet 2 Taking  . promethazine (PHENERGAN) 25 MG tablet Take 1 tablet (25 mg total) by mouth every 6 (six) hours as needed for nausea. (Patient not taking: Reported on 04/08/2014) 10 tablet 0      Review of Systems   All systems reviewed and negative except as stated in HPI  Blood pressure 166/104, pulse 90, temperature 98.4 F (36.9 C), temperature source Oral, resp. rate 20, height 5\' 6"  (1.676 m), weight 286 lb (129.729 kg), last menstrual period 08/03/2013, not currently breastfeeding. General  appearance: alert, cooperative and appears stated age Lungs: clear to auscultation bilaterally, no w/r/r. Slightly dysnpeic with speaking Heart: regular rate and rhythm; No JVD. Abdomen: soft, non-tender; bowel sounds normal Pelvic: adequate Extremities: Homans sign is negative, no sign of DVT; 4+ pitting edema to mid thigh DTR's 2+ bilaterally (patella) Presentation: cephalic on BSUS Fetal monitoringBaseline: 150 bpm, Variability: Good {> 6 bpm) and Accelerations: Reactive Uterine activity: Infrequently     Prenatal labs: ABO, Rh: O/NEG/-- (06/04 0942) Antibody: NEG (10/02 1015) Rubella:   RPR: NON REAC (10/02 1015)  HBsAg: NEGATIVE (06/04 0942)  HIV: NONREACTIVE (10/02 1015)  GBS:   Neg 1 hr Glucola 238 Genetic screening  Normal Anatomy US Normal   Prenatal Transfer Tool  Maternal Diabetes: Yes:  Diabetes Type:  Insulin/Medication controlled Genetic Screening: Normal Maternal Ultrasounds/Referrals: Normal Fetal Ultrasounds or other Referrals:  Fetal echo Maternal Substance Abuse:  No Significant Maternal Medications:  Meds include: Other: Labetalol, procardia, lasix prn, glyburide Significant Maternal Lab Results: Lab values include: Group B Strep negative     Results for orders placed or performed in visit on 04/08/14 (from the past 24 hour(s))  POCT urinalysis dipstick    Collection Time: 04/08/14  9:45 AM  Result Value Ref Range   Color, UA     Clarity, UA     Glucose, UA neg    Bilirubin, UA     Ketones, UA neg    Spec Grav, UA     Blood, UA neg    pH, UA     Protein, UA 2    Urobilinogen, UA     Nitrite, UA neg    Leukocytes, UA Negative   Results for orders placed or performed in visit on 04/08/14 (from the past 24 hour(s))  US OB Follow Up   Collection Time: 04/08/14  9:16 AM  Result Value Ref Range   Biparietal Diameter  cm   Abdominal Circumference  cm   Femoral Diameter  cm   Head Circumference  cm   HC/AC     Estimated Fetal Weight  grams    Patient Active Problem List   Diagnosis Date Noted  . Pre-eclampsia superimposed on chronic hypertension, antepartum 04/08/2014  . Polyhydramnios in third trimester, antepartum 03/14/2014  . Abnormal maternal glucose tolerance, antepartum 03/11/2014  . High-risk pregnancy 03/11/2014  . Chronic hypertension in pregnancy 03/11/2014  . Impaired glucose tolerance during pregnancy, antepartum 11/27/2013  . Fibroids 11/22/2013  . History of gestational diabetes in prior pregnancy, currently pregnant in first trimester 11/22/2013  . Hx of preeclampsia, prior pregnancy, currently pregnant 11/22/2013  . Short interval between pregnancies affecting pregnancy, antepartum 11/22/2013  . Supervision of high-risk pregnancy of elderly primigravida (>= 38 years old at delivery), third trimester 11/22/2013  . Miscarriage 03/19/2013  . 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 Hypertension   . Hypertensive heart disease   . Microcytic anemia   . Osteoarthritis, knee 03/29/2011  . SLEEP APNEA 12/09/2009  . Obesity 04/16/2009    Assessment: Chelsey Santiago is a 38 y.o. [id number] at [redacted]w[redacted]d here for IOL 2/2 severe pre-eclampsia with HELLP.   #Labor: Place Foley Bulb and start pitocin  # Severe Pre-eclampsia with HELLP - per BP > 160/105, UP:C 0.3, doubling of Cr and LFTs and ? Pulmonary edema. Normal Platelets - Home  labetalol 600 mg PO TID; hold home procardia - IV labetalol  prn BP > 160/105. - IV lasix 20 mg x 2, to dose q 8 hours - IV Magnesium, to  continue 24 hrs pp - strict I&Os  - Check CBC, CMP q 12 hours, next at 0500   #) Cardiomyopathy w/ EF 35% on Echo with severe hypokinesis of the inferior wall and the inferolateral wall. Hypokinesis of the other walls. Wall thickness was increased in a pattern of mild LVH. Mild MR. ProBNP 1854. No s/sx pulmonary edema with no rales on PE. Pt w/ h/o hypertensive heart disease w/ h/o Pulmonary edema postpartum. Mild to moderate mitral regurgitation when hospitalized for CHF in 2009; Echocardiogram in 12/2009-no MR and normal EF. Uses furosemide approximately once per week for dyspnea, pedal edema, or a sense of an increase in abdominal girth. - IV lasix 20 mg q 8 hours with good UOP - Echo postpartum  #) A2GDM - hold home glyburide 10 mg BID - q 4 hour accucheck - 71, 75 - q 2 when in active labor  #Pain: Epidural when able #FWB: Cat I, reassuring #ID:  GBS neg #MOF:  Bottle #MOC:BTL #Circ:  It's a girl!  Tula Nakayama 04/08/2014, 2:50 PM

## 2014-04-08 NOTE — Plan of Care (Signed)
Problem: Phase I Progression Outcomes Goal: Assess per MD/Nurse,Routine-VS,FHR,UC,Head to Toe assess Outcome: Completed/Met Date Met:  04/08/14 Goal: Obtain and review prenatal records Outcome: Completed/Met Date Met:  04/08/14 Goal: IV Pain medications as ordered Outcome: Completed/Met Date Met:  04/08/14 Goal: Pitocin as ordered Outcome: Completed/Met Date Met:  04/08/14  Problem: Phase II Progression Outcomes Goal: Fetal monitoring per orders Outcome: Completed/Met Date Met:  04/08/14

## 2014-04-09 ENCOUNTER — Observation Stay (HOSPITAL_COMMUNITY): Payer: Medicaid Other | Admitting: Anesthesiology

## 2014-04-09 ENCOUNTER — Encounter (HOSPITAL_COMMUNITY): Payer: Self-pay | Admitting: Anesthesiology

## 2014-04-09 ENCOUNTER — Encounter (HOSPITAL_COMMUNITY)
Admission: AD | Disposition: A | Discharge status: Home / Self Care | Payer: Self-pay | Source: Ambulatory Visit | Attending: Obstetrics and Gynecology

## 2014-04-09 DIAGNOSIS — O1410 Severe pre-eclampsia, unspecified trimester: Secondary | ICD-10-CM | POA: Diagnosis present

## 2014-04-09 DIAGNOSIS — O113 Pre-existing hypertension with pre-eclampsia, third trimester: Secondary | ICD-10-CM

## 2014-04-09 DIAGNOSIS — I428 Other cardiomyopathies: Secondary | ICD-10-CM

## 2014-04-09 DIAGNOSIS — I5020 Unspecified systolic (congestive) heart failure: Secondary | ICD-10-CM

## 2014-04-09 LAB — GLUCOSE, CAPILLARY
Glucose-Capillary: 106 mg/dL — ABNORMAL HIGH (ref 70–99)
Glucose-Capillary: 111 mg/dL — ABNORMAL HIGH (ref 70–99)
Glucose-Capillary: 123 mg/dL — ABNORMAL HIGH (ref 70–99)

## 2014-04-09 LAB — COMPREHENSIVE METABOLIC PANEL
ALBUMIN: 2.1 g/dL — AB (ref 3.5–5.2)
ALK PHOS: 117 U/L (ref 39–117)
ALT: 182 U/L — AB (ref 0–35)
ANION GAP: 13 (ref 5–15)
AST: 151 U/L — ABNORMAL HIGH (ref 0–37)
BUN: 22 mg/dL (ref 6–23)
CHLORIDE: 102 meq/L (ref 96–112)
CO2: 21 mEq/L (ref 19–32)
Calcium: 8.7 mg/dL (ref 8.4–10.5)
Creatinine, Ser: 1.06 mg/dL (ref 0.50–1.10)
GFR calc Af Amer: 76 mL/min — ABNORMAL LOW (ref >90)
GFR calc non Af Amer: 66 mL/min — ABNORMAL LOW (ref >90)
Glucose, Bld: 124 mg/dL — ABNORMAL HIGH (ref 70–99)
POTASSIUM: 4.5 meq/L (ref 3.7–5.3)
SODIUM: 136 meq/L — AB (ref 137–147)
Total Bilirubin: 0.6 mg/dL (ref 0.3–1.2)
Total Protein: 5.3 g/dL — ABNORMAL LOW (ref 6.0–8.3)

## 2014-04-09 LAB — GC/CHLAMYDIA PROBE AMP
CT Probe RNA: NEGATIVE
GC Probe RNA: NEGATIVE

## 2014-04-09 LAB — CBC
HCT: 32.8 % — ABNORMAL LOW (ref 36.0–46.0)
HEMATOCRIT: 31.2 % — AB (ref 36.0–46.0)
Hemoglobin: 10.7 g/dL — ABNORMAL LOW (ref 12.0–15.0)
Hemoglobin: 10.1 g/dL — ABNORMAL LOW (ref 12.0–15.0)
MCH: 23.1 pg — ABNORMAL LOW (ref 26.0–34.0)
MCH: 23 pg — ABNORMAL LOW (ref 26.0–34.0)
MCHC: 32.6 g/dL (ref 30.0–36.0)
MCHC: 32.4 g/dL (ref 30.0–36.0)
MCV: 70.8 fL — ABNORMAL LOW (ref 78.0–100.0)
MCV: 71.1 fL — ABNORMAL LOW (ref 78.0–100.0)
PLATELETS: 157 10*3/uL (ref 150–400)
Platelets: 157 10*3/uL (ref 150–400)
RBC: 4.63 MIL/uL (ref 3.87–5.11)
RBC: 4.39 MIL/uL (ref 3.87–5.11)
RDW: 15.4 % (ref 11.5–15.5)
RDW: 15.4 % (ref 11.5–15.5)
WBC: 16.1 10*3/uL — AB (ref 4.0–10.5)
WBC: 16.8 10*3/uL — AB (ref 4.0–10.5)

## 2014-04-09 LAB — HIV ANTIBODY (ROUTINE TESTING W REFLEX): HIV 1&2 Ab, 4th Generation: NONREACTIVE

## 2014-04-09 SURGERY — Surgical Case
Anesthesia: Epidural | Site: Abdomen

## 2014-04-09 MED ORDER — NALBUPHINE HCL 10 MG/ML IJ SOLN: 5.0000 mg | Freq: Once | INTRAMUSCULAR | Status: AC | PRN

## 2014-04-09 MED ORDER — SODIUM CHLORIDE 0.9 % IJ SOLN: 3.0000 mL | INTRAMUSCULAR | Status: DC | PRN

## 2014-04-09 MED ORDER — SIMETHICONE 80 MG PO CHEW
80.0000 mg | CHEWABLE_TABLET | Freq: Three times a day (TID) | ORAL | Status: DC
  Administered 2014-04-10 (×3): 80 mg via ORAL
  Filled 2014-04-09 (×3): qty 1

## 2014-04-09 MED ORDER — MAGNESIUM SULFATE 40 G IN LACTATED RINGERS - SIMPLE
2.0000 g/h | INTRAVENOUS | Status: DC
  Administered 2014-04-09: 2 g/h via INTRAVENOUS
  Filled 2014-04-09: qty 500

## 2014-04-09 MED ORDER — OXYTOCIN 10 UNIT/ML IJ SOLN
INTRAMUSCULAR | Status: AC
  Filled 2014-04-09: qty 4

## 2014-04-09 MED ORDER — LIDOCAINE-EPINEPHRINE (PF) 2 %-1:200000 IJ SOLN
INTRAMUSCULAR | Status: AC
  Filled 2014-04-09: qty 20

## 2014-04-09 MED ORDER — TERBUTALINE SULFATE 1 MG/ML IJ SOLN
0.2500 mg | Freq: Once | INTRAMUSCULAR | Status: AC
  Administered 2014-04-09: 1 mg via SUBCUTANEOUS

## 2014-04-09 MED ORDER — DIPHENHYDRAMINE HCL 25 MG PO CAPS: 25.0000 mg | ORAL_CAPSULE | ORAL | Status: DC | PRN

## 2014-04-09 MED ORDER — SCOPOLAMINE 1 MG/3DAYS TD PT72: 1.0000 | MEDICATED_PATCH | Freq: Once | TRANSDERMAL | Status: DC

## 2014-04-09 MED ORDER — DIPHENHYDRAMINE HCL 50 MG/ML IJ SOLN
12.5000 mg | INTRAMUSCULAR | Status: DC | PRN
  Administered 2014-04-09: 12.5 mg via INTRAVENOUS
  Filled 2014-04-09: qty 1

## 2014-04-09 MED ORDER — LACTATED RINGERS IV SOLN
40.0000 [IU] | INTRAVENOUS | Status: DC | PRN
Start: 2014-04-09 — End: 2014-04-09
  Administered 2014-04-09: 40 [IU] via INTRAVENOUS

## 2014-04-09 MED ORDER — PHENYLEPHRINE HCL 10 MG/ML IJ SOLN
INTRAMUSCULAR | Status: DC | PRN
  Administered 2014-04-09 (×3): 40 ug via INTRAVENOUS

## 2014-04-09 MED ORDER — ONDANSETRON HCL 4 MG/2ML IJ SOLN
INTRAMUSCULAR | Status: DC | PRN
  Administered 2014-04-09: 4 mg via INTRAVENOUS

## 2014-04-09 MED ORDER — HYDRALAZINE HCL 20 MG/ML IJ SOLN
10.0000 mg | Freq: Once | INTRAMUSCULAR | Status: AC
  Administered 2014-04-09: 10 mg via INTRAVENOUS
  Filled 2014-04-09: qty 1

## 2014-04-09 MED ORDER — MENTHOL 3 MG MT LOZG: 1.0000 | LOZENGE | OROMUCOSAL | Status: DC | PRN

## 2014-04-09 MED ORDER — ONDANSETRON HCL 4 MG PO TABS: 4.0000 mg | ORAL_TABLET | ORAL | Status: DC | PRN

## 2014-04-09 MED ORDER — SENNOSIDES-DOCUSATE SODIUM 8.6-50 MG PO TABS
2.0000 | ORAL_TABLET | ORAL | Status: DC
  Administered 2014-04-10: 2 via ORAL
  Filled 2014-04-09 (×2): qty 2

## 2014-04-09 MED ORDER — SIMETHICONE 80 MG PO CHEW
80.0000 mg | CHEWABLE_TABLET | ORAL | Status: DC
  Administered 2014-04-10: 80 mg via ORAL
  Filled 2014-04-09 (×2): qty 1

## 2014-04-09 MED ORDER — EPHEDRINE 5 MG/ML INJ: 10.0000 mg | INTRAVENOUS | Status: DC | PRN

## 2014-04-09 MED ORDER — DIBUCAINE 1 % RE OINT: 1.0000 "application " | TOPICAL_OINTMENT | RECTAL | Status: DC | PRN

## 2014-04-09 MED ORDER — MORPHINE SULFATE (PF) 0.5 MG/ML IJ SOLN
INTRAMUSCULAR | Status: DC | PRN
  Administered 2014-04-09: 4 mg via EPIDURAL

## 2014-04-09 MED ORDER — LACTATED RINGERS IV SOLN
INTRAVENOUS | Status: DC
  Administered 2014-04-09 – 2014-04-10 (×2): via INTRAVENOUS

## 2014-04-09 MED ORDER — OXYCODONE-ACETAMINOPHEN 5-325 MG PO TABS
1.0000 | ORAL_TABLET | ORAL | Status: DC | PRN
  Administered 2014-04-10 (×2): 1 via ORAL
  Filled 2014-04-09 (×2): qty 1

## 2014-04-09 MED ORDER — NALBUPHINE HCL 10 MG/ML IJ SOLN
5.0000 mg | INTRAMUSCULAR | Status: DC | PRN
  Administered 2014-04-09 – 2014-04-10 (×3): 5 mg via INTRAVENOUS
  Filled 2014-04-09 (×3): qty 1

## 2014-04-09 MED ORDER — LIDOCAINE HCL (PF) 1 % IJ SOLN
INTRAMUSCULAR | Status: DC | PRN
  Administered 2014-04-09: 3 mL
  Administered 2014-04-09: 4 mL

## 2014-04-09 MED ORDER — TETANUS-DIPHTH-ACELL PERTUSSIS 5-2.5-18.5 LF-MCG/0.5 IM SUSP
0.5000 mL | Freq: Once | INTRAMUSCULAR | Status: DC
  Filled 2014-04-09: qty 0.5

## 2014-04-09 MED ORDER — EPHEDRINE 5 MG/ML INJ
10.0000 mg | INTRAVENOUS | Status: DC | PRN
Start: 2014-04-09 — End: 2014-04-09

## 2014-04-09 MED ORDER — TERBUTALINE SULFATE 1 MG/ML IJ SOLN
INTRAMUSCULAR | Status: AC
  Administered 2014-04-09: 1 mg via SUBCUTANEOUS
  Filled 2014-04-09: qty 1

## 2014-04-09 MED ORDER — OXYCODONE-ACETAMINOPHEN 5-325 MG PO TABS
2.0000 | ORAL_TABLET | ORAL | Status: DC | PRN
  Administered 2014-04-10 (×3): 2 via ORAL
  Filled 2014-04-09 (×3): qty 2

## 2014-04-09 MED ORDER — NALBUPHINE HCL 10 MG/ML IJ SOLN: 5.0000 mg | INTRAMUSCULAR | Status: DC | PRN

## 2014-04-09 MED ORDER — ZOLPIDEM TARTRATE 5 MG PO TABS: 5.0000 mg | ORAL_TABLET | Freq: Every evening | ORAL | Status: DC | PRN

## 2014-04-09 MED ORDER — LANOLIN HYDROUS EX OINT: 1.0000 "application " | TOPICAL_OINTMENT | CUTANEOUS | Status: DC | PRN

## 2014-04-09 MED ORDER — ONDANSETRON HCL 4 MG/2ML IJ SOLN
4.0000 mg | Freq: Three times a day (TID) | INTRAMUSCULAR | Status: DC | PRN
Start: 2014-04-09 — End: 2014-04-10

## 2014-04-09 MED ORDER — PHENYLEPHRINE 40 MCG/ML (10ML) SYRINGE FOR IV PUSH (FOR BLOOD PRESSURE SUPPORT)
80.0000 ug | PREFILLED_SYRINGE | INTRAVENOUS | Status: DC | PRN
  Filled 2014-04-09: qty 10

## 2014-04-09 MED ORDER — PHENYLEPHRINE 40 MCG/ML (10ML) SYRINGE FOR IV PUSH (FOR BLOOD PRESSURE SUPPORT): 80.0000 ug | PREFILLED_SYRINGE | INTRAVENOUS | Status: DC | PRN

## 2014-04-09 MED ORDER — FENTANYL 2.5 MCG/ML BUPIVACAINE 1/10 % EPIDURAL INFUSION (WH - ANES)
INTRAMUSCULAR | Status: DC | PRN
  Administered 2014-04-09: 12.5 mL/h via EPIDURAL

## 2014-04-09 MED ORDER — ERYTHROMYCIN 5 MG/GM OP OINT
TOPICAL_OINTMENT | OPHTHALMIC | Status: AC
  Filled 2014-04-09: qty 1

## 2014-04-09 MED ORDER — FENTANYL 2.5 MCG/ML BUPIVACAINE 1/10 % EPIDURAL INFUSION (WH - ANES)
14.0000 mL/h | INTRAMUSCULAR | Status: DC | PRN
  Administered 2014-04-09: 14 mL/h via EPIDURAL
  Filled 2014-04-09: qty 125

## 2014-04-09 MED ORDER — LABETALOL HCL 200 MG PO TABS
300.0000 mg | ORAL_TABLET | Freq: Two times a day (BID) | ORAL | Status: DC
  Administered 2014-04-10: 300 mg via ORAL
  Filled 2014-04-09 (×4): qty 1

## 2014-04-09 MED ORDER — ONDANSETRON HCL 4 MG/2ML IJ SOLN
4.0000 mg | INTRAMUSCULAR | Status: DC | PRN
  Administered 2014-04-09 (×2): 4 mg via INTRAVENOUS
  Filled 2014-04-09 (×2): qty 2

## 2014-04-09 MED ORDER — ONDANSETRON HCL 4 MG/2ML IJ SOLN
INTRAMUSCULAR | Status: AC
  Filled 2014-04-09: qty 2

## 2014-04-09 MED ORDER — PRENATAL MULTIVITAMIN CH
1.0000 | ORAL_TABLET | Freq: Every day | ORAL | Status: DC
  Administered 2014-04-10: 1 via ORAL
  Filled 2014-04-09: qty 1

## 2014-04-09 MED ORDER — LISINOPRIL 10 MG PO TABS
10.0000 mg | ORAL_TABLET | Freq: Every day | ORAL | Status: DC
  Administered 2014-04-09 – 2014-04-10 (×2): 10 mg via ORAL
  Filled 2014-04-09 (×3): qty 1

## 2014-04-09 MED ORDER — OXYTOCIN 40 UNITS IN LACTATED RINGERS INFUSION - SIMPLE MED: 62.5000 mL/h | INTRAVENOUS | Status: AC

## 2014-04-09 MED ORDER — LACTATED RINGERS IV SOLN
INTRAVENOUS | Status: DC | PRN
  Administered 2014-04-09 (×2): via INTRAVENOUS

## 2014-04-09 MED ORDER — SODIUM BICARBONATE 8.4 % IV SOLN
INTRAVENOUS | Status: AC
  Filled 2014-04-09: qty 50

## 2014-04-09 MED ORDER — DEXTROSE 5 % IV SOLN: 1.0000 ug/kg/h | INTRAVENOUS | Status: DC | PRN

## 2014-04-09 MED ORDER — MISOPROSTOL 200 MCG PO TABS: 50.0000 ug | ORAL_TABLET | Freq: Once | ORAL | Status: DC

## 2014-04-09 MED ORDER — FENTANYL CITRATE 0.05 MG/ML IJ SOLN
INTRAMUSCULAR | Status: AC
  Filled 2014-04-09: qty 2

## 2014-04-09 MED ORDER — NALOXONE HCL 0.4 MG/ML IJ SOLN: 0.4000 mg | INTRAMUSCULAR | Status: DC | PRN

## 2014-04-09 MED ORDER — DIPHENHYDRAMINE HCL 25 MG PO CAPS: 25.0000 mg | ORAL_CAPSULE | Freq: Four times a day (QID) | ORAL | Status: DC | PRN

## 2014-04-09 MED ORDER — SIMETHICONE 80 MG PO CHEW: 80.0000 mg | CHEWABLE_TABLET | ORAL | Status: DC | PRN

## 2014-04-09 MED ORDER — LACTATED RINGERS IV SOLN: 500.0000 mL | Freq: Once | INTRAVENOUS | Status: DC

## 2014-04-09 MED ORDER — FUROSEMIDE 10 MG/ML IJ SOLN
20.0000 mg | Freq: Three times a day (TID) | INTRAMUSCULAR | Status: DC
  Administered 2014-04-09 – 2014-04-10 (×3): 20 mg via INTRAVENOUS
  Filled 2014-04-09 (×5): qty 2

## 2014-04-09 MED ORDER — ACETAMINOPHEN 500 MG PO TABS
1000.0000 mg | ORAL_TABLET | Freq: Four times a day (QID) | ORAL | Status: DC | PRN
  Administered 2014-04-09: 1000 mg via ORAL
  Filled 2014-04-09: qty 2

## 2014-04-09 MED ORDER — FUROSEMIDE 10 MG/ML IJ SOLN
20.0000 mg | Freq: Three times a day (TID) | INTRAMUSCULAR | Status: DC
  Administered 2014-04-09: 20 mg via INTRAVENOUS
  Filled 2014-04-09 (×2): qty 2

## 2014-04-09 MED ORDER — DEXTROSE 5 % IV SOLN
3.0000 g | INTRAVENOUS | Status: DC | PRN
  Administered 2014-04-09: 3 g via INTRAVENOUS

## 2014-04-09 MED ORDER — SODIUM BICARBONATE 8.4 % IV SOLN
INTRAVENOUS | Status: DC | PRN
  Administered 2014-04-09: 4 mL via EPIDURAL
  Administered 2014-04-09: 5 mL via EPIDURAL

## 2014-04-09 MED ORDER — FENTANYL CITRATE 0.05 MG/ML IJ SOLN
25.0000 ug | INTRAMUSCULAR | Status: DC | PRN
  Administered 2014-04-09 (×3): 25 ug via INTRAVENOUS

## 2014-04-09 MED ORDER — WITCH HAZEL-GLYCERIN EX PADS: 1.0000 "application " | MEDICATED_PAD | CUTANEOUS | Status: DC | PRN

## 2014-04-09 MED ORDER — MEPERIDINE HCL 25 MG/ML IJ SOLN
INTRAMUSCULAR | Status: AC
  Filled 2014-04-09: qty 1

## 2014-04-09 MED ORDER — MORPHINE SULFATE 0.5 MG/ML IJ SOLN
INTRAMUSCULAR | Status: AC
  Filled 2014-04-09: qty 10

## 2014-04-09 MED ORDER — PHENYLEPHRINE 40 MCG/ML (10ML) SYRINGE FOR IV PUSH (FOR BLOOD PRESSURE SUPPORT)
PREFILLED_SYRINGE | INTRAVENOUS | Status: AC
  Filled 2014-04-09: qty 5

## 2014-04-09 MED ORDER — METOCLOPRAMIDE HCL 5 MG/ML IJ SOLN
INTRAMUSCULAR | Status: DC | PRN
  Administered 2014-04-09: 10 mg via INTRAVENOUS

## 2014-04-09 MED ORDER — MEPERIDINE HCL 25 MG/ML IJ SOLN
INTRAMUSCULAR | Status: DC | PRN
  Administered 2014-04-09: 12.5 mg via INTRAVENOUS

## 2014-04-09 MED ORDER — DIPHENHYDRAMINE HCL 50 MG/ML IJ SOLN: 12.5000 mg | INTRAMUSCULAR | Status: DC | PRN

## 2014-04-09 MED ORDER — MEPERIDINE HCL 25 MG/ML IJ SOLN: 6.2500 mg | INTRAMUSCULAR | Status: DC | PRN

## 2014-04-09 SURGICAL SUPPLY — 32 items
CLAMP CORD UMBIL (MISCELLANEOUS) IMPLANT
CONTAINER PREFILL 10% NBF 15ML (MISCELLANEOUS) IMPLANT
DRAPE SHEET LG 3/4 BI-LAMINATE (DRAPES) IMPLANT
DRESSING DISP NPWT PICO 4X12 (MISCELLANEOUS) ×3 IMPLANT
DRSG OPSITE POSTOP 4X10 (GAUZE/BANDAGES/DRESSINGS) ×3 IMPLANT
DRSG VAC ATS MED SENSATRAC (GAUZE/BANDAGES/DRESSINGS) ×3 IMPLANT
DURAPREP 26ML APPLICATOR (WOUND CARE) ×3 IMPLANT
ELECT REM PT RETURN 9FT ADLT (ELECTROSURGICAL) ×3
ELECTRODE REM PT RTRN 9FT ADLT (ELECTROSURGICAL) ×1 IMPLANT
EXTRACTOR VACUUM M CUP 4 TUBE (SUCTIONS) IMPLANT
EXTRACTOR VACUUM M CUP 4' TUBE (SUCTIONS)
GLOVE BIOGEL PI IND STRL 6.5 (GLOVE) ×1 IMPLANT
GLOVE BIOGEL PI INDICATOR 6.5 (GLOVE) ×2
GLOVE SURG SS PI 6.0 STRL IVOR (GLOVE) ×3 IMPLANT
GOWN STRL REUS W/TWL LRG LVL3 (GOWN DISPOSABLE) ×6 IMPLANT
KIT ABG SYR 3ML LUER SLIP (SYRINGE) IMPLANT
NEEDLE HYPO 25X5/8 SAFETYGLIDE (NEEDLE) IMPLANT
NS IRRIG 1000ML POUR BTL (IV SOLUTION) ×3 IMPLANT
PACK C SECTION WH (CUSTOM PROCEDURE TRAY) ×3 IMPLANT
PAD OB MATERNITY 4.3X12.25 (PERSONAL CARE ITEMS) ×3 IMPLANT
RTRCTR C-SECT PINK 25CM LRG (MISCELLANEOUS) IMPLANT
SEPRAFILM MEMBRANE 5X6 (MISCELLANEOUS) IMPLANT
SPONGE LAP 18X18 X RAY DECT (DISPOSABLE) ×3 IMPLANT
STAPLER VISISTAT 35W (STAPLE) IMPLANT
SUT PLAIN 0 NONE (SUTURE) ×3 IMPLANT
SUT PLAIN 2 0 XLH (SUTURE) ×3 IMPLANT
SUT VIC AB 0 CT1 36 (SUTURE) ×15 IMPLANT
SUT VIC AB 4-0 KS 27 (SUTURE) ×3 IMPLANT
SWABSTICK BENZOIN STERILE (MISCELLANEOUS) ×3 IMPLANT
TOWEL OR 17X24 6PK STRL BLUE (TOWEL DISPOSABLE) ×3 IMPLANT
TRAY FOLEY CATH 14FR (SET/KITS/TRAYS/PACK) ×3 IMPLANT
WATER STERILE IRR 1000ML POUR (IV SOLUTION) ×3 IMPLANT

## 2014-04-09 NOTE — Anesthesia Postprocedure Evaluation (Signed)
  Anesthesia Post-op Note  Anesthesia Post Note  Patient: Chelsey Santiago  Procedure(s) Performed: Procedure(s) (LRB): CESAREAN SECTION (N/A)  Anesthesia type: Epidural  Patient location: AICU  Post pain: Pain level controlled  Post assessment: Post-op Vital signs reviewed  Last Vitals:  Filed Vitals:   04/09/14 1900  BP: 121/75  Pulse: 72  Temp:   Resp: 16    Post vital signs: Reviewed  Level of consciousness:alert  Complications: No apparent anesthesia complications

## 2014-04-09 NOTE — Anesthesia Preprocedure Evaluation (Addendum)
Anesthesia Evaluation  Patient identified by MRN, date of birth, ID band Patient awake    Reviewed: Allergy & Precautions, H&P , NPO status , Patient's Chart, lab work & pertinent test results, reviewed documented beta blocker date and time   Airway Mallampati: II  TM Distance: >3 FB Neck ROM: Full    Dental no notable dental hx. (+) Teeth Intact   Pulmonary shortness of breath, with exertion and at rest, sleep apnea and Continuous Positive Airway Pressure Ventilation ,  Non compliant with CPAP breath sounds clear to auscultation  Pulmonary exam normal       Cardiovascular hypertension, Pt. on medications and Pt. on home beta blockers +CHF Rhythm:Regular Rate:Normal  LVEF 35% on yesterday's Echocardiogram Hx/o Post partum Pulmonary Edema   Neuro/Psych  Headaches, PSYCHIATRIC DISORDERS Anxiety Depression    GI/Hepatic   Endo/Other  diabetes, Poorly Controlled, Gestational, Oral Hypoglycemic AgentsMorbid obesity  Renal/GU      Musculoskeletal  (+) Arthritis -,   Abdominal (+) + obese,   Peds  Hematology  (+) anemia ,   Anesthesia Other Findings   Reproductive/Obstetrics (+) Pregnancy                            Anesthesia Physical Anesthesia Plan  ASA: III and emergent  Anesthesia Plan: Epidural   Post-op Pain Management:    Induction:   Airway Management Planned: Natural Airway  Additional Equipment:   Intra-op Plan:   Post-operative Plan:   Informed Consent: I have reviewed the patients History and Physical, chart, labs and discussed the procedure including the risks, benefits and alternatives for the proposed anesthesia with the patient or authorized representative who has indicated his/her understanding and acceptance.     Plan Discussed with: Anesthesiologist, CRNA and Surgeon  Anesthesia Plan Comments: (Breech presentation, failed version, for c/section. Epidural has been  dosed.)       Anesthesia Quick Evaluation

## 2014-04-09 NOTE — Progress Notes (Signed)
38 yo [id number] being induced for severe preeclampsia at [redacted]w[redacted]d. Called in birthing suite to confirm presentation. Ultrasound demonstrates infant in breech presentation. Discussed external cephalic version or proceeding with a primary cesarean section. Patient opted to try an ECV.  After informed consent was obtained, patient received IM terbutaline and her epidural was re-dosed. External cephalic version was attempted 4 times under ultrasound guidance and failed.  Patient was then counseled and consented for primary cesarean section with bilateral tubal ligation. Risks, benefits and alternatives were explained including but not limited to risks of bleeding, infection and damage to adjacent organs. Permanence of the BTL was also discussed along with its associated small risks of ectopic pregnancy if pregnancy was to be achieved. The patient verbalized understanding and all questions were answered.

## 2014-04-09 NOTE — Progress Notes (Signed)
Patient ID: Chelsey Santiago, female   DOB: 30-Apr-1976, 38 y.o.   MRN: [id number] Labor Progress Note  ASSESSMENT:   Chelsey Santiago 38 y.o. [id number] at [redacted]w[redacted]d in induced labor for severe pre-eclampsia   PLAN:  1) Labor curve reviewed.       Progress: Latent labor, induced            Plan: Continue Pitocin per protocol  - FB in place  2) Fetal heart tracing reviewed.    Cat 1, reassuring  3) GBS Status - neg  4)  Severe Pre-eclampsia with HELLP syndrome - per BP > 160/105, UP:C 0.3, doubling of Cr and LFTs. 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% - IV lasix 20 mg q 8 hours with good UOP - Echo postpartum   SUBJECTIVE:  Feeling pain with contractions.   OBJECTIVE:  Vital Signs: Patient Vitals for the past 2 hrs:  BP Temp Temp src Pulse Resp SpO2  04/09/14 0040 - - - 82 - 91 %  04/09/14 0029 (!) 145/98 mmHg - - 77 18 100 %  04/09/14 0025 - - - 76 - -  04/09/14 0020 - - - 74 - 100 %  04/09/14 0015 - - - 72 - 99 %  04/09/14 0010 - - - 72 - 99 %  04/09/14 0005 - - - 72 - 99 %  04/09/14 0000 139/86 mmHg 97.8 F (36.6 C) Axillary 73 18 98 %  04/08/14 2355 - - - 73 - 98 %  04/08/14 2350 - - - 74 - 99 %  04/08/14 2345 - - - 73 - 98 %  04/08/14 2340 - - - 75 - 100 %  04/08/14 2335 - - - 74 - 98 %  04/08/14 2330 (!) 156/102 mmHg - - 76 20 98 %  04/08/14 2325 - - - 77 - 99 %  04/08/14 2320 - - - 79 - 98 %  04/08/14 2315 - - - 82 - 97 %  04/08/14 2310 - - - 86 - 99 %  04/08/14 2308 - - - 79 - 99 %  04/08/14 2305 - - - 79 - 99 %  04/08/14 2301 (!) 151/96 mmHg - - 78 20 -  04/08/14 2300 - - - 76 - 96 %  04/08/14 2255 (!) 162/99 mmHg - - 80 - 99 %   SVE: Dilation: 3.5, Effacement (%): Thick, Station: -3  FHR Monitoring Baseline Rate  (A): 130 bpm   Accelerations: None Contraction Frequency (min): 1-5  I/O last 3 completed shifts: In: 371.7 [P.O.:60; I.V.:311.7] Out: 1100 [Urine:1100] Total I/O In: 662.8 [P.O.:50; I.V.:612.8] Out: 1600 [Urine:1600]   Gen: NAD, WDWN Ext: 4+ Pitting edema to thighs Cardiac: RRR, no m/r/g Lungs: CTAB, no w/r/r DTRs: 1+ bilaterally

## 2014-04-09 NOTE — Plan of Care (Signed)
Problem: Phase I Progression Outcomes Goal: Foley catheter patent Outcome: Completed/Met Date Met:  04/09/14

## 2014-04-09 NOTE — Progress Notes (Signed)
Version unsuccessful.

## 2014-04-09 NOTE — Anesthesia Procedure Notes (Signed)
Epidural Patient location during procedure: OB Start time: 04/09/2014 8:12 AM  Staffing Anesthesiologist: Iyan Flett A. Performed by: anesthesiologist   Preanesthetic Checklist Completed: patient identified, site marked, surgical consent, pre-op evaluation, timeout performed, IV checked, risks and benefits discussed and monitors and equipment checked  Epidural Patient position: sitting Prep: site prepped and draped and DuraPrep Patient monitoring: continuous pulse ox and blood pressure Approach: midline Location: L4-L5 Injection technique: LOR air  Needle:  Needle type: Tuohy  Needle gauge: 17 G Needle length: 9 cm and 9 Needle insertion depth: 9 cm Catheter type: closed end flexible Catheter size: 19 Gauge Catheter at skin depth: 14 cm Test dose: negative and Other  Assessment Events: blood not aspirated, injection not painful, no injection resistance, negative IV test and no paresthesia  Additional Notes Patient identified. Risks and benefits discussed including failed block, incomplete  Pain control, post dural puncture headache, nerve damage, paralysis, blood pressure Changes, nausea, vomiting, reactions to medications-both toxic and allergic and post Partum back pain. All questions were answered. Patient expressed understanding and wished to proceed. Sterile technique was used throughout procedure. Epidural site was Dressed with sterile barrier dressing. No paresthesias, signs of intravascular injection Or signs of intrathecal spread were encountered.  Patient was more comfortable after the epidural was dosed. Please see RN's note for documentation of vital signs and FHR which are stable.

## 2014-04-09 NOTE — Anesthesia Postprocedure Evaluation (Signed)
  Anesthesia Post-op Note  Patient: Chelsey Santiago  Procedure(s) Performed: Procedure(s): CESAREAN SECTION (N/A)  Patient Location: PACU  Anesthesia Type:Epidural  Level of Consciousness: awake, alert  and oriented  Airway and Oxygen Therapy: Patient Spontanous Breathing  Post-op Pain: none  Post-op Assessment: Post-op Vital signs reviewed, Patient's Cardiovascular Status Stable, Respiratory Function Stable, Patent Airway, No signs of Nausea or vomiting, Pain level controlled, No headache, No backache, No residual numbness and No residual motor weakness  Post-op Vital Signs: Reviewed and stable  Last Vitals:  Filed Vitals:   04/09/14 1406  BP: 129/82  Pulse:   Temp: 36.8 C  Resp:     Complications: No apparent anesthesia complications

## 2014-04-09 NOTE — Op Note (Signed)
[facility] PROCEDURE DATE: 04/08/2014 - 04/09/2014  PREOPERATIVE DIAGNOSIS: Intrauterine pregnancy at  [redacted]w[redacted]d weeks gestation; severe preeclampsia, systolic congestive heart failure, pulmonary edema, fibroid uterus, transverse presentation, satisfied parity  POSTOPERATIVE DIAGNOSIS: The same  PROCEDURE:     Cesarean Section  SURGEON:  Dr. Mora Bellman  ASSISTANT: Dr. Nila Nephew  INDICATIONS: Chelsey Santiago is a 38 y.o. [id number] at [redacted]w[redacted]d scheduled for cesarean section with bilateral tubal ligation secondary to malpresentation: transverse presentation..  Induction started yesterday secondary to severe preeclampsia, patient ultrasounded multiple times including official ultrasound and was found to be cephalic.  Patient received cytotec and foley balloon, suspect that baby spontaneously rotated.  Patient underwent failed ECV earlier today once transverse presentation noted. The risks of cesarean section discussed with the patient included but were not limited to: bleeding which may require transfusion or reoperation; infection which may require antibiotics; injury to bowel, bladder, ureters or other surrounding organs; injury to the fetus; need for additional procedures including hysterectomy in the event of a life-threatening hemorrhage; placental abnormalities wth subsequent pregnancies, incisional problems, thromboembolic phenomenon and other postoperative/anesthesia complications. The patient concurred with the proposed plan, giving informed written consent for the procedure.    FINDINGS:  Viable female/female infant in cephalic presentation.  Apgars 5 and 8, weight.  Clear amniotic fluid.  Intact placenta, three vessel cord.  Normal uterus, fallopian tubes and ovaries bilaterally. 8cm fibroid at lower uterine segment.  Multiple small fibroids noted as well.  ANESTHESIA:    Spinal INTRAVENOUS FLUIDS:1000 ml ESTIMATED BLOOD LOSS: 900 ml URINE OUTPUT:  150 ml SPECIMENS: Placenta sent  to pathology/L&D COMPLICATIONS: None immediate  PROCEDURE IN DETAIL:  The patient received intravenous antibiotics and had sequential compression devices applied to her lower extremities while in the preoperative area.  She was then taken to the operating room where anesthesia was induced and was found to be adequate. A foley catheter was placed into her bladder and attached to constant gravity. She was then placed in a dorsal supine position with a leftward tilt, and prepped and draped in a sterile manner. After an adequate timeout was performed, a Pfannenstiel skin incision was made with scalpel and carried through to the underlying layer of fascia. The fascia was incised in the midline and this incision was extended bilaterally using the Mayo scissors. Kocher clamps were applied to the superior aspect of the fascial incision and the underlying rectus muscles were dissected off bluntly. A similar process was carried out on the inferior aspect of the facial incision. The rectus muscles were separated in the midline bluntly and the peritoneum was entered bluntly. The Alexis self-retaining retractor was introduced into the abdominal cavity. Attention was turned to the lower uterine segment and a high transverse hysterotomy was made with a scalpel secondary to large 8cm fibroid at lower uterine segment and incision was extended bilaterally bluntly. The infant was successfully delivered, and cord was clamped and cut and infant was handed over to awaiting neonatology team. Uterine massage was then administered and the placenta delivered intact with three-vessel cord. The uterus was cleared of clot and debris.  The hysterotomy was closed with 0 Vicryl in a running locked fashion, and 3 imbricating layers were also placed with a 0 Vicryl secondary to high lower transverse incision . Overall, excellent hemostasis was noted. The patient's left fallopian tube was then identified, brought to the incision, and grasped with  a Babcock clamp. The tube was then followed out to the fimbria. The Babcock clamp  was then used to grasp the tube approximately 4 cm from the cornual region. A 3 cm segment of the tube was then ligated with free tie of plain gut suture, transected and excised. Good hemostasis was noted and the tube was returned to the abdomen. The right fallopian tube was then identified to its fimbriated end, ligated, and a 3 cm segment excised in a similar fashion. Excellent hemostasis was noted, and the tube returned to the abdomen.  The pelvis copiously irrigated and cleared of all clot and debris. Hemostasis was confirmed on all surfaces.  The peritoneum and the muscles were reapproximated using 0 vicryl interrupted stitches. The fascia was then closed using 0 Vicryl in a running locked fashion.  The subcutaneous layer was reapproximated with plain gut and the skin was closed in a subcuticular fashion using 3.0 Vicryl. The patient tolerated the procedure well. Sponge, lap, instrument and needle counts were correct x 2. She was taken to the recovery room in stable condition.    Chet Greenley ROCIO MD  04/09/2014 4:28 PM

## 2014-04-09 NOTE — Addendum Note (Signed)
Addendum  created 04/09/14 1932 by Billie Lade, CRNA   Modules edited: Notes Section   Notes Section:  File: [id number]

## 2014-04-09 NOTE — Progress Notes (Signed)
UR chart review completed.  

## 2014-04-09 NOTE — Progress Notes (Addendum)
Discussed care with Dr. Quay Burow, cardiology.  He agrees with current management of diuresis with lasix, beta-blocker, advises to start ACE I prior to discharge.  F/u with Dr. Harl Bowie in 2 weeks. => decrease labetalol 300mg  BID (from 600 TID), start lisinopril.  If blood pressure tolerable will increase lisinopril to 20mg  tomorrow  BP 122/67 mmHg  Pulse 77  Temp(Src) 97.6 F (36.4 C) (Oral)  Resp 18  Ht 5\' 6"  (1.676 m)  Wt 275 lb 6.4 oz (124.921 kg)  BMI 44.47 kg/m2  SpO2 99%  LMP 08/03/2013  Breastfeeding? Unknown   Merla Riches, MD

## 2014-04-09 NOTE — Progress Notes (Signed)
Patient ID: Chelsey Santiago, female   DOB: 01/10/1976, 38 y.o.   MRN: [id number] Patient ID: Chelsey Santiago, female   DOB: February 20, 1976, 38 y.o.   MRN: [id number] Patient ID: Chelsey Santiago, female   DOB: [birth year], 38 y.o.   MRN: [id number] Labor Progress Note  ASSESSMENT:   Chelsey Santiago 38 y.o. [id number] at [redacted]w[redacted]d in induced labor for severe pre-eclampsia   PLAN:  1) Labor curve reviewed.       Progress: Latent labor, induced            Plan: Continue Pitocin per protocol  - FB out, will titrate pitocin per protocol and plan for AROM   2) Fetal heart tracing reviewed.    Cat II, with minimal variability and no accelerations  3) GBS Status - neg  4)  Severe Pre-eclampsia with HELLP syndrome - per BP > 160/105, UP:C 0.3, doubling of Cr and LFTs. 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% - IV lasix 20 mg q 8 hours with good UOP - Echo postpartum   6) A2GDM - hold home glyburide 10 mg BID - q 4 hour accucheck - 71, 75 - q 2 when in active labor  SUBJECTIVE:  Contractions q 2 min, painful   OBJECTIVE:  Vital Signs:  Patient Vitals for the past 2 hrs:  BP Pulse Resp SpO2  04/09/14 0745 - 80 - 100 %  04/09/14 0740 - 77 - 100 %  04/09/14 0735 - 79 - 100 %  04/09/14 0731 - 76 - -  04/09/14 0730 (!) 186/118 mmHg 74 - 100 %  04/09/14 0725 - 77 - 100 %  04/09/14 0720 - 80 - 100 %  04/09/14 0715 - 74 - 100 %  04/09/14 0710 - 78 - 99 %  04/09/14 0705 - 72 - 99 %  04/09/14 0700 (!) 160/96 mmHg 72 16 99 %  04/09/14 0655 - 74 - 99 %  04/09/14 0650 - 78 - 100 %  04/09/14 0635 - 93 - 99 %  04/09/14 0630 (!) 171/102 mmHg 74 - 99 %  04/09/14 0625 - 77 - 100 %  04/09/14 0620 - 81 - 100 %  04/09/14 0615 - 76 - 100 %  04/09/14 0610 - 76 - 100 %   04/09/14 0605 - 75 - 100 %   SVE: Dilation: 4, Effacement (%): 30, Station: Ballotable  FHR Monitoring Baseline Rate (A): 125 bpm   Accelerations: None Contraction Frequency (min): 1.5-2  I/O last 3 completed shifts: In: 1259.5 [P.O.:110; I.V.:1149.5] Out: 3050 [Urine:3050]     Gen: NAD, WDWN Ext: 4+ Pitting edema to thighs Cardiac: RRR, no m/r/g Lungs: CTAB, no w/r/r DTRs: 1+ bilaterally

## 2014-04-09 NOTE — Progress Notes (Signed)
Patient ID: Chelsey Santiago, female   DOB: [birth year], 38 y.o.   MRN: [id number] Patient ID: Chelsey Santiago, female   DOB: 07-04-1975, 38 y.o.   MRN: [id number] Labor Progress Note  ASSESSMENT:   Chelsey Santiago 38 y.o. [id number] at [redacted]w[redacted]d in induced labor for severe pre-eclampsia   PLAN:  1) Labor curve reviewed.       Progress: Latent labor, induced            Plan: Continue Pitocin per protocol  - FB out, will titrate pitocin per protocol and plan for AROM   2) Fetal heart tracing reviewed.    Cat II, with minimal variability and no accelerations  3) GBS Status - neg  4)  Severe Pre-eclampsia with HELLP syndrome - per BP > 160/105, UP:C 0.3, doubling of Cr and LFTs. 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    OBJECTIVE:  Vital Signs:  Patient Vitals for the past 2 hrs:  BP Pulse Resp SpO2  04/09/14 0315 - 86 - 100 %  04/09/14 0310 (!) 152/97 mmHg 81 - 100 %  04/09/14 0305 (!) 156/97 mmHg 80 - 100 %  04/09/14 0300 (!) 146/89 mmHg 82 20 100 %  04/09/14 0255 (!) 157/96 mmHg 78 - 99 %  04/09/14 0250 (!) 152/93 mmHg 79 - 99 %  04/09/14 0245 (!) 162/103 mmHg 82 - 100 %  04/09/14 0240 (!) 168/92 mmHg 92 - 99 %  04/09/14 0235 - 80 - 99 %  04/09/14 0230 (!) 161/95 mmHg 80 20 100 %  04/09/14 0225 (!) 157/96 mmHg 79 - 100 %  04/09/14 0220 (!) 152/95 mmHg 78 - 100 %  04/09/14 0215 (!) 149/93 mmHg 79 - 99 %  04/09/14 0210 (!) 149/93 mmHg 78 - 100 %   SVE: Dilation: 3.5, Effacement (%): Thick, Station: -3  FHR Monitoring Baseline Rate  (A): 130 bpm   Accelerations: None Contraction Frequency (min): 1-4  I/O last 3 completed shifts: In: 371.7 [P.O.:60; I.V.:311.7] Out: 1100 [Urine:1100] Total I/O In: 737.8 [P.O.:50; I.V.:687.8] Out: 1600 [Urine:1600]   Gen: NAD, WDWN Ext: 4+ Pitting edema to thighs Cardiac: RRR, no m/r/g Lungs: CTAB, no w/r/r DTRs: 1+ bilaterally

## 2014-04-09 NOTE — Progress Notes (Addendum)
Patient ID: Chelsey Santiago, female   DOB: [birth year], 38 y.o.   MRN: [id number] Labor Progress Note  ASSESSMENT:   Chelsey Santiago 38 y.o. [id number] at [redacted]w[redacted]d in induced labor for severe pre-eclampsia   PLAN:  1) Labor curve reviewed.       Progress: Latent labor, induced. Foley bulb out            Plan: Continue Pitocin per protocol. Anticipate NSVD  2) Fetal heart tracing reviewed.    Cat 2  3) GBS Status - neg  4)  Severe Pre-eclampsia with HELLP syndrome - per BP > 160/105, UP:C 0.3, doubling of Cr and LFTs. 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 Says IV pain medications adequate for pain control   OBJECTIVE:  Vital Signs:  Patient Vitals for the past 2 hrs:  BP Pulse Resp SpO2  04/09/14 0315 - 86 - 100 %  04/09/14 0310 (!) 152/97 mmHg 81 - 100 %  04/09/14 0305 (!) 156/97 mmHg 80 - 100 %  04/09/14 0300 (!) 146/89 mmHg 82 20 100 %  04/09/14 0255 (!) 157/96 mmHg 78 - 99 %  04/09/14 0250 (!) 152/93 mmHg 79 - 99 %  04/09/14 0245 (!) 162/103 mmHg 82 - 100 %  04/09/14 0240 (!) 168/92 mmHg 92 - 99 %  04/09/14 0235 - 80 - 99 %  04/09/14 0230 (!) 161/95 mmHg 80 20 100 %  04/09/14 0225 (!) 157/96 mmHg 79 - 100 %  04/09/14 0220 (!) 152/95 mmHg 78 - 100 %  04/09/14 0215 (!) 149/93 mmHg 79 - 99 %  04/09/14 0210 (!) 149/93 mmHg 78 - 100 %  04/09/14 0205 (!) 157/99 mmHg 75 - 99 %  04/09/14 0200 (!) 162/102 mmHg 72 (!) 22 100 %  04/09/14 0155 (!) 159/105 mmHg 75 - 100 %  04/09/14 0150 (!) 165/106 mmHg 74 20 99 %   SVE: Dilation: 3.5, Effacement (%): Thick, Station: -3  FHR Monitoring Baseline Rate (A): 130 bpm   Accelerations: None Contraction Frequency (min): 1-4  I/O  last 3 completed shifts: In: 371.7 [P.O.:60; I.V.:311.7] Out: 1100 [Urine:1100] Total I/O In: 737.8 [P.O.:50; I.V.:687.8] Out: 1600 [Urine:1600]   Gen: NAD, WDWN Ext: 2+ Pitting edema to thighs Cardiac: RRR, no m/r/g Lungs: mild rales at left lower base, otherwise clear to auscultation DTRs: 1+ bilaterally  Cordelia Poche, MD PGY-2 04/09/2014, 3:47 AM

## 2014-04-09 NOTE — Plan of Care (Signed)
Problem: Phase I Progression Outcomes Goal: IS, TCDB as ordered Outcome: Completed/Met Date Met:  04/09/14

## 2014-04-09 NOTE — Transfer of Care (Signed)
Immediate Anesthesia Transfer of Care Note  Patient: Chelsey Santiago  Procedure(s) Performed: Procedure(s): CESAREAN SECTION (N/A)  Patient Location: PACU  Anesthesia Type:Epidural  Level of Consciousness: awake, alert  and oriented  Airway & Oxygen Therapy: Patient Spontanous Breathing  Post-op Assessment: Report given to PACU RN and Post -op Vital signs reviewed and stable  Post vital signs: Reviewed and stable  Complications: No apparent anesthesia complications

## 2014-04-10 ENCOUNTER — Encounter (HOSPITAL_COMMUNITY): Payer: Self-pay | Admitting: Obstetrics and Gynecology

## 2014-04-10 LAB — GLUCOSE, CAPILLARY: Glucose-Capillary: 99 mg/dL (ref 70–99)

## 2014-04-10 LAB — STREP B DNA PROBE: STREP GROUP B AG: NOT DETECTED

## 2014-04-10 MED ORDER — OXYCODONE-ACETAMINOPHEN 5-325 MG PO TABS
1.0000 | ORAL_TABLET | ORAL | Status: DC | PRN
  Administered 2014-04-10 – 2014-04-15 (×9): 1 via ORAL
  Filled 2014-04-10 (×9): qty 1

## 2014-04-10 MED ORDER — SIMETHICONE 80 MG PO CHEW: 80.0000 mg | CHEWABLE_TABLET | ORAL | Status: DC | PRN

## 2014-04-10 MED ORDER — IBUPROFEN 600 MG PO TABS
600.0000 mg | ORAL_TABLET | Freq: Four times a day (QID) | ORAL | Status: DC
  Administered 2014-04-11 – 2014-04-15 (×18): 600 mg via ORAL
  Filled 2014-04-10 (×18): qty 1

## 2014-04-10 MED ORDER — ONDANSETRON HCL 4 MG PO TABS
4.0000 mg | ORAL_TABLET | ORAL | Status: DC | PRN
  Administered 2014-04-11 – 2014-04-12 (×2): 4 mg via ORAL
  Filled 2014-04-10 (×2): qty 1

## 2014-04-10 MED ORDER — ONDANSETRON HCL 4 MG/2ML IJ SOLN
4.0000 mg | INTRAMUSCULAR | Status: DC | PRN
  Administered 2014-04-11: 4 mg via INTRAVENOUS
  Filled 2014-04-10: qty 2

## 2014-04-10 MED ORDER — WITCH HAZEL-GLYCERIN EX PADS: 1.0000 "application " | MEDICATED_PAD | CUTANEOUS | Status: DC | PRN

## 2014-04-10 MED ORDER — CARVEDILOL 12.5 MG PO TABS: 12.5000 mg | ORAL_TABLET | Freq: Two times a day (BID) | ORAL | Status: DC

## 2014-04-10 MED ORDER — LISINOPRIL 10 MG PO TABS
10.0000 mg | ORAL_TABLET | Freq: Every day | ORAL | Status: DC
  Administered 2014-04-11: 10 mg via ORAL
  Filled 2014-04-10 (×2): qty 1

## 2014-04-10 MED ORDER — SIMETHICONE 80 MG PO CHEW
80.0000 mg | CHEWABLE_TABLET | Freq: Three times a day (TID) | ORAL | Status: DC
  Administered 2014-04-11 – 2014-04-15 (×13): 80 mg via ORAL
  Filled 2014-04-10 (×13): qty 1

## 2014-04-10 MED ORDER — LANOLIN HYDROUS EX OINT: 1.0000 "application " | TOPICAL_OINTMENT | CUTANEOUS | Status: DC | PRN

## 2014-04-10 MED ORDER — OXYCODONE-ACETAMINOPHEN 5-325 MG PO TABS
2.0000 | ORAL_TABLET | ORAL | Status: DC | PRN
  Administered 2014-04-11 – 2014-04-14 (×13): 2 via ORAL
  Filled 2014-04-10 (×13): qty 2

## 2014-04-10 MED ORDER — LACTATED RINGERS IV SOLN: INTRAVENOUS | Status: DC

## 2014-04-10 MED ORDER — DIBUCAINE 1 % RE OINT: 1.0000 "application " | TOPICAL_OINTMENT | RECTAL | Status: DC | PRN

## 2014-04-10 MED ORDER — PRENATAL MULTIVITAMIN CH
1.0000 | ORAL_TABLET | Freq: Every day | ORAL | Status: DC
  Administered 2014-04-11 – 2014-04-14 (×4): 1 via ORAL
  Filled 2014-04-10 (×4): qty 1

## 2014-04-10 MED ORDER — LABETALOL HCL 200 MG PO TABS: 300.0000 mg | ORAL_TABLET | Freq: Two times a day (BID) | ORAL | Status: DC

## 2014-04-10 MED ORDER — NALBUPHINE HCL 10 MG/ML IJ SOLN
5.0000 mg | INTRAMUSCULAR | Status: DC | PRN
  Filled 2014-04-10: qty 1

## 2014-04-10 MED ORDER — DIPHENHYDRAMINE HCL 25 MG PO CAPS: 25.0000 mg | ORAL_CAPSULE | Freq: Four times a day (QID) | ORAL | Status: DC | PRN

## 2014-04-10 MED ORDER — SIMETHICONE 80 MG PO CHEW
80.0000 mg | CHEWABLE_TABLET | ORAL | Status: DC
  Administered 2014-04-11 – 2014-04-14 (×5): 80 mg via ORAL
  Filled 2014-04-10 (×5): qty 1

## 2014-04-10 MED ORDER — SENNOSIDES-DOCUSATE SODIUM 8.6-50 MG PO TABS
2.0000 | ORAL_TABLET | ORAL | Status: DC
  Administered 2014-04-11 – 2014-04-14 (×5): 2 via ORAL
  Filled 2014-04-10 (×5): qty 2

## 2014-04-10 MED ORDER — MENTHOL 3 MG MT LOZG: 1.0000 | LOZENGE | OROMUCOSAL | Status: DC | PRN

## 2014-04-10 MED ORDER — TETANUS-DIPHTH-ACELL PERTUSSIS 5-2.5-18.5 LF-MCG/0.5 IM SUSP: 0.5000 mL | Freq: Once | INTRAMUSCULAR | Status: DC

## 2014-04-10 MED ORDER — ZOLPIDEM TARTRATE 5 MG PO TABS: 5.0000 mg | ORAL_TABLET | Freq: Every evening | ORAL | Status: DC | PRN

## 2014-04-10 MED ORDER — OXYTOCIN 40 UNITS IN LACTATED RINGERS INFUSION - SIMPLE MED: 62.5000 mL/h | INTRAVENOUS | Status: AC

## 2014-04-10 MED ORDER — LISINOPRIL 20 MG PO TABS: 20.0000 mg | ORAL_TABLET | Freq: Every day | ORAL | Status: DC

## 2014-04-10 MED ORDER — CARVEDILOL 12.5 MG PO TABS
12.5000 mg | ORAL_TABLET | Freq: Two times a day (BID) | ORAL | Status: DC
  Administered 2014-04-10 – 2014-04-11 (×3): 12.5 mg via ORAL
  Filled 2014-04-10 (×4): qty 1

## 2014-04-10 NOTE — Progress Notes (Addendum)
Subjective: Postpartum Day 1: Cesarean Delivery with BTL Patient reports feeling well. She denies CP, SOB, lightheadedness/dizziness. Her pain is minimal. She ambulated around the room yesterday evening without issues  Objective: Vital signs in last 24 hours: Temp:  [97.3 F (36.3 C)-98.4 F (36.9 C)] 97.6 F (36.4 C) (11/25 0400) Pulse Rate:  [25-93] 80 (11/25 0500) Resp:  [16-22] 18 (11/25 0400) BP: (99-192)/(53-126) 122/68 mmHg (11/25 0500) SpO2:  [87 %-100 %] 95 % (11/25 0500) Weight:  [275 lb 6.4 oz (124.921 kg)] 275 lb 6.4 oz (124.921 kg) (11/24 1905)  Physical Exam:  General: alert, cooperative and no distress  Lungs: mild crackles at [facility] Heart: RRR Lochia: appropriate Uterine Fundus: firm, NT Incision: no significant drainage, no dehiscence DVT Evaluation: No evidence of DVT seen on physical exam.   Recent Labs  04/09/14 0505 04/09/14 1421  HGB 10.7* 10.1*  HCT 32.8* 31.2*    Assessment/Plan: Status post Cesarean section. Doing well postoperatively.  Continue magnesium sulfate for seizure prophylaxis until 24 hour s/p delivery Continue monitoring BP Continue routine postpartum care.  Aisley Whan 04/10/2014, 6:14 AM

## 2014-04-10 NOTE — Plan of Care (Signed)
Problem: Consults Goal: Postpartum Patient Education (See Patient Education module for education specifics.)  Outcome: Completed/Met Date Met:  04/10/14  Problem: Phase I Progression Outcomes Goal: Voiding adequately Outcome: Completed/Met Date Met:  04/10/14 Goal: OOB as tolerated unless otherwise ordered Outcome: Completed/Met Date Met:  04/10/14 Goal: Initial discharge plan identified Outcome: Completed/Met Date Met:  04/10/14

## 2014-04-10 NOTE — Plan of Care (Signed)
Problem: Phase I Progression Outcomes Goal: Pain controlled with appropriate interventions Outcome: Completed/Met Date Met:  04/10/14 Goal: VS, stable, temp < 100.4 degrees F Outcome: Completed/Met Date Met:  04/10/14  Problem: Phase II Progression Outcomes Goal: Pain controlled on oral analgesia Outcome: Completed/Met Date Met:  04/10/14 Goal: Rh isoimmunization per orders Outcome: Not Applicable Date Met:  57/84/69 Goal: Tolerating diet Outcome: Completed/Met Date Met:  04/10/14

## 2014-04-11 DIAGNOSIS — J810 Acute pulmonary edema: Secondary | ICD-10-CM | POA: Diagnosis not present

## 2014-04-11 DIAGNOSIS — D649 Anemia, unspecified: Secondary | ICD-10-CM | POA: Diagnosis not present

## 2014-04-11 DIAGNOSIS — Z98891 History of uterine scar from previous surgery: Secondary | ICD-10-CM

## 2014-04-11 DIAGNOSIS — R7989 Other specified abnormal findings of blood chemistry: Secondary | ICD-10-CM | POA: Diagnosis not present

## 2014-04-11 LAB — COMPREHENSIVE METABOLIC PANEL
ALBUMIN: 1.6 g/dL — AB (ref 3.5–5.2)
ALT: 49 U/L — ABNORMAL HIGH (ref 0–35)
AST: 25 U/L (ref 0–37)
Alkaline Phosphatase: 76 U/L (ref 39–117)
Anion gap: 10 (ref 5–15)
BUN: 26 mg/dL — ABNORMAL HIGH (ref 6–23)
CALCIUM: 7.8 mg/dL — AB (ref 8.4–10.5)
CO2: 25 mEq/L (ref 19–32)
Chloride: 100 mEq/L (ref 96–112)
Creatinine, Ser: 1.34 mg/dL — ABNORMAL HIGH (ref 0.50–1.10)
GFR calc Af Amer: 57 mL/min — ABNORMAL LOW (ref >90)
GFR, EST NON AFRICAN AMERICAN: 50 mL/min — AB (ref >90)
Glucose, Bld: 102 mg/dL — ABNORMAL HIGH (ref 70–99)
Potassium: 4 mEq/L (ref 3.7–5.3)
SODIUM: 135 meq/L — AB (ref 137–147)
Total Bilirubin: <0.2 mg/dL — ABNORMAL LOW (ref 0.3–1.2)
Total Protein: 4.6 g/dL — ABNORMAL LOW (ref 6.0–8.3)

## 2014-04-11 LAB — CBC
HCT: 23.3 % — ABNORMAL LOW (ref 36.0–46.0)
Hemoglobin: 7.5 g/dL — ABNORMAL LOW (ref 12.0–15.0)
MCH: 23.1 pg — AB (ref 26.0–34.0)
MCHC: 32.2 g/dL (ref 30.0–36.0)
MCV: 71.7 fL — ABNORMAL LOW (ref 78.0–100.0)
PLATELETS: 111 10*3/uL — AB (ref 150–400)
RBC: 3.25 MIL/uL — ABNORMAL LOW (ref 3.87–5.11)
RDW: 16 % — AB (ref 11.5–15.5)
WBC: 17.2 10*3/uL — AB (ref 4.0–10.5)

## 2014-04-11 MED ORDER — LISINOPRIL 20 MG PO TABS
20.0000 mg | ORAL_TABLET | Freq: Every day | ORAL | Status: DC
Start: 2014-04-12 — End: 2014-04-15
  Administered 2014-04-12 – 2014-04-15 (×4): 20 mg via ORAL
  Filled 2014-04-11 (×4): qty 1

## 2014-04-11 MED ORDER — FUROSEMIDE 40 MG PO TABS
40.0000 mg | ORAL_TABLET | Freq: Three times a day (TID) | ORAL | Status: DC
  Administered 2014-04-11 (×2): 40 mg via ORAL
  Filled 2014-04-11 (×4): qty 1

## 2014-04-11 MED ORDER — LISINOPRIL 10 MG PO TABS
10.0000 mg | ORAL_TABLET | Freq: Once | ORAL | Status: AC
  Administered 2014-04-11: 10 mg via ORAL
  Filled 2014-04-11: qty 1

## 2014-04-11 MED ORDER — FUROSEMIDE 40 MG PO TABS
40.0000 mg | ORAL_TABLET | Freq: Two times a day (BID) | ORAL | Status: DC
  Administered 2014-04-12: 40 mg via ORAL
  Filled 2014-04-11: qty 1

## 2014-04-11 MED ORDER — DOCUSATE SODIUM 100 MG PO CAPS
100.0000 mg | ORAL_CAPSULE | Freq: Two times a day (BID) | ORAL | Status: DC
Start: 2014-04-11 — End: 2014-04-15
  Administered 2014-04-11 – 2014-04-15 (×9): 100 mg via ORAL
  Filled 2014-04-11 (×9): qty 1

## 2014-04-11 MED ORDER — CARVEDILOL 25 MG PO TABS
25.0000 mg | ORAL_TABLET | Freq: Two times a day (BID) | ORAL | Status: DC
  Administered 2014-04-12 – 2014-04-15 (×7): 25 mg via ORAL
  Filled 2014-04-11 (×7): qty 1

## 2014-04-11 MED ORDER — CARVEDILOL 12.5 MG PO TABS
12.5000 mg | ORAL_TABLET | Freq: Once | ORAL | Status: AC
  Administered 2014-04-11: 12.5 mg via ORAL
  Filled 2014-04-11: qty 1

## 2014-04-11 MED ORDER — FERROUS SULFATE 325 (65 FE) MG PO TABS
325.0000 mg | ORAL_TABLET | Freq: Three times a day (TID) | ORAL | Status: DC
Start: 2014-04-11 — End: 2014-04-15
  Administered 2014-04-11 – 2014-04-15 (×10): 325 mg via ORAL
  Filled 2014-04-11 (×10): qty 1

## 2014-04-11 NOTE — Progress Notes (Signed)
Dr Kennon Rounds notified about patient current BP and UOP.  Per patient she has been taking Labetatol prior to pregnancy and this is currently not ordered.  Told MD about this and she said someone will be by to see her shortly.  Will continue to monitor.

## 2014-04-11 NOTE — Progress Notes (Signed)
[facility]N Attending Note  Patient is diuresing well in response to Lasix 40 mg po x 1. No acute concerns.  Results for orders placed or performed during the hospital encounter of 04/08/14 (from the past 24 hour(s))  CBC     Status: Abnormal   Collection Time: 04/11/14 10:55 AM  Result Value Ref Range   WBC 17.2 (H) 4.0 - 10.5 K/uL   RBC 3.25 (L) 3.87 - 5.11 MIL/uL   Hemoglobin 7.5 (L) 12.0 - 15.0 g/dL   HCT 23.3 (L) 36.0 - 46.0 %   MCV 71.7 (L) 78.0 - 100.0 fL   MCH 23.1 (L) 26.0 - 34.0 pg   MCHC 32.2 30.0 - 36.0 g/dL   RDW 16.0 (H) 11.5 - 15.5 %   Platelets 111 (L) 150 - 400 K/uL  Comprehensive metabolic panel     Status: Abnormal   Collection Time: 04/11/14 10:55 AM  Result Value Ref Range   Sodium 135 (L) 137 - 147 mEq/L   Potassium 4.0 3.7 - 5.3 mEq/L   Chloride 100 96 - 112 mEq/L   CO2 25 19 - 32 mEq/L   Glucose, Bld 102 (H) 70 - 99 mg/dL   BUN 26 (H) 6 - 23 mg/dL   Creatinine, Ser 1.34 (H) 0.50 - 1.10 mg/dL   Calcium 7.8 (L) 8.4 - 10.5 mg/dL   Total Protein 4.6 (L) 6.0 - 8.3 g/dL   Albumin 1.6 (L) 3.5 - 5.2 g/dL   AST 25 0 - 37 U/L   ALT 49 (H) 0 - 35 U/L   Alkaline Phosphatase 76 39 - 117 U/L   Total Bilirubin <0.2 (L) 0.3 - 1.2 mg/dL   GFR calc non Af Amer 50 (L) >90 mL/min   GFR calc Af Amer 57 (L) >90 mL/min   Anion gap 10 5 - 15   Discussed results and patient's overall clinical condition with Dr. Baltazar Apo (Critical Care Medicine) who feels that we can continue gentle diuresis; with goal of -500 ml/day.  Her Cr is elevated as a result of her preeclampsia, aggressive diuresis can also increase Cr.  Will follow up Cr in the morning. On Lasix 40 mg po bid.  No transfusion indicated for now for Hgb 7.5; patient is fluid overloaded and not symptomatic.  Continue close observation for now.   Verita Schneiders, MD, [place] Attending [place], [facility]

## 2014-04-11 NOTE — Progress Notes (Signed)
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  Patient reports feeling better today. She denies CP, SOB, lightheadedness/dizziness, headache, visual symptoms. Her pain is minimal. She ambulated around the room yesterday evening without issues.  Reports flatus. Bottlefeeding infant; infant is doing well in room with patient.  Objective: Vital signs in last 24 hours: Temp:  [97.5 F (36.4 C)-99.2 F (37.3 C)] 97.6 F (36.4 C) (11/26 0900) Pulse Rate:  [72-97] 74 (11/26 1030) Resp:  [16-20] 18 (11/26 0900) BP: (120-160)/(64-100) 152/100 mmHg (11/26 1030) SpO2:  [96 %-99 %] 99 % (11/26 0900) 11/25 0701 - 11/26 0700 In: 445.4 [I.V.:445.4] Out: 2250 [Urine:2250] UOP over the last few hours  ~25 ml/hr  Physical Exam:  General: alert, cooperative and no distress  Lungs: mild crackles at [facility] Heart: RRR Lochia: appropriate Uterine Fundus: firm, NT Incision: no significant drainage, no dehiscence, PICO dressing in place DVT Evaluation: No evidence of DVT seen on physical exam. Extremities: 3-4+ BLE edema   Assessment/Plan: Patient with concerning UOP; will give Lasix 40 mg po tid and monitor response Continue Coreg and Lisinopril; titrate dosage as needed Strict Is and Os  Will check CMET, CBC now; replete as needed Continue close observation   ANYANWU,UGONNA A, MD 04/11/2014, 10:41 AM

## 2014-04-12 LAB — PREPARE RBC (CROSSMATCH)

## 2014-04-12 LAB — CBC
HEMATOCRIT: 21.7 % — AB (ref 36.0–46.0)
Hemoglobin: 6.9 g/dL — CL (ref 12.0–15.0)
MCH: 22.8 pg — AB (ref 26.0–34.0)
MCHC: 31.8 g/dL (ref 30.0–36.0)
MCV: 71.9 fL — AB (ref 78.0–100.0)
Platelets: 125 10*3/uL — ABNORMAL LOW (ref 150–400)
RBC: 3.02 MIL/uL — AB (ref 3.87–5.11)
RDW: 16.1 % — ABNORMAL HIGH (ref 11.5–15.5)
WBC: 12.6 10*3/uL — ABNORMAL HIGH (ref 4.0–10.5)

## 2014-04-12 LAB — COMPREHENSIVE METABOLIC PANEL
ALT: 41 U/L — ABNORMAL HIGH (ref 0–35)
AST: 23 U/L (ref 0–37)
Albumin: 1.6 g/dL — ABNORMAL LOW (ref 3.5–5.2)
Alkaline Phosphatase: 81 U/L (ref 39–117)
Anion gap: 10 (ref 5–15)
BUN: 28 mg/dL — AB (ref 6–23)
CO2: 26 mEq/L (ref 19–32)
CREATININE: 1.38 mg/dL — AB (ref 0.50–1.10)
Calcium: 8.1 mg/dL — ABNORMAL LOW (ref 8.4–10.5)
Chloride: 99 mEq/L (ref 96–112)
GFR calc Af Amer: 55 mL/min — ABNORMAL LOW (ref >90)
GFR, EST NON AFRICAN AMERICAN: 48 mL/min — AB (ref >90)
GLUCOSE: 114 mg/dL — AB (ref 70–99)
Potassium: 3.9 mEq/L (ref 3.7–5.3)
Sodium: 135 mEq/L — ABNORMAL LOW (ref 137–147)
Total Protein: 4.7 g/dL — ABNORMAL LOW (ref 6.0–8.3)

## 2014-04-12 MED ORDER — CALCIUM CARBONATE ANTACID 500 MG PO CHEW
1.0000 | CHEWABLE_TABLET | Freq: Two times a day (BID) | ORAL | Status: DC | PRN
  Administered 2014-04-12 (×2): 200 mg via ORAL
  Filled 2014-04-12 (×2): qty 1

## 2014-04-12 MED ORDER — FUROSEMIDE 10 MG/ML IJ SOLN
40.0000 mg | Freq: Once | INTRAMUSCULAR | Status: AC
  Administered 2014-04-12: 40 mg via INTRAVENOUS
  Filled 2014-04-12: qty 4

## 2014-04-12 MED ORDER — SODIUM CHLORIDE 0.9 % IV SOLN
Freq: Once | INTRAVENOUS | Status: AC
  Administered 2014-04-12: 10:00:00 via INTRAVENOUS

## 2014-04-12 MED ORDER — FUROSEMIDE 10 MG/ML IJ SOLN
40.0000 mg | Freq: Four times a day (QID) | INTRAMUSCULAR | Status: AC
  Administered 2014-04-13 (×4): 40 mg via INTRAVENOUS
  Filled 2014-04-12 (×4): qty 4

## 2014-04-12 NOTE — Plan of Care (Signed)
Problem: Discharge Progression Outcomes Goal: Barriers To Progression Addressed/Resolved Outcome: Completed/Met Date Met:  04/12/14 Goal: Tolerating diet Outcome: Completed/Met Date Met:  38/46/65 Goal: Complications resolved/controlled Outcome: Completed/Met Date Met:  04/12/14 Goal: Pain controlled with appropriate interventions Outcome: Completed/Met Date Met:  04/12/14 Goal: Afebrile, VS remain stable at discharge Outcome: Completed/Met Date Met:  04/12/14

## 2014-04-12 NOTE — Progress Notes (Signed)
68 Am ---Rn called Dr Lonny Prude  regarding patient's blood pressure  of 159/90 and output. No further orders given. Ilan.Hilts Am--- Rn called Dr. Lonny Prude  regarding patient's blood pressure of 163/90 and hemoglobin of 6.9. No Further orders given.

## 2014-04-12 NOTE — Plan of Care (Signed)
Problem: Phase II Progression Outcomes Goal: Incision intact & without signs/symptoms of infection Outcome: Completed/Met Date Met:  04/12/14

## 2014-04-12 NOTE — Plan of Care (Signed)
Problem: Phase II Progression Outcomes Goal: Progress activity as tolerated unless otherwise ordered Outcome: Completed/Met Date Met:  04/12/14 Goal: Afebrile, VS remain stable Outcome: Completed/Met Date Met:  04/12/14

## 2014-04-12 NOTE — Plan of Care (Signed)
Problem: Phase I Progression Outcomes Goal: VS, stable, temp < 100.4 degrees F Outcome: Completed/Met Date Met:  04/12/14     

## 2014-04-13 LAB — TYPE AND SCREEN
ABO/RH(D): O NEG
Antibody Screen: NEGATIVE
Unit division: 0
Unit division: 0

## 2014-04-13 LAB — BASIC METABOLIC PANEL
ANION GAP: 11 (ref 5–15)
BUN: 29 mg/dL — ABNORMAL HIGH (ref 6–23)
CALCIUM: 8.3 mg/dL — AB (ref 8.4–10.5)
CO2: 27 mEq/L (ref 19–32)
Chloride: 101 mEq/L (ref 96–112)
Creatinine, Ser: 1.28 mg/dL — ABNORMAL HIGH (ref 0.50–1.10)
GFR calc Af Amer: 61 mL/min — ABNORMAL LOW (ref >90)
GFR, EST NON AFRICAN AMERICAN: 52 mL/min — AB (ref >90)
Glucose, Bld: 89 mg/dL (ref 70–99)
Potassium: 4 mEq/L (ref 3.7–5.3)
SODIUM: 139 meq/L (ref 137–147)

## 2014-04-13 LAB — CBC
HCT: 26.7 % — ABNORMAL LOW (ref 36.0–46.0)
Hemoglobin: 8.7 g/dL — ABNORMAL LOW (ref 12.0–15.0)
MCH: 23.9 pg — AB (ref 26.0–34.0)
MCHC: 32.6 g/dL (ref 30.0–36.0)
MCV: 73.4 fL — ABNORMAL LOW (ref 78.0–100.0)
Platelets: 126 10*3/uL — ABNORMAL LOW (ref 150–400)
RBC: 3.64 MIL/uL — AB (ref 3.87–5.11)
RDW: 16.4 % — ABNORMAL HIGH (ref 11.5–15.5)
WBC: 9.7 10*3/uL (ref 4.0–10.5)

## 2014-04-13 MED ORDER — FUROSEMIDE 40 MG PO TABS
40.0000 mg | ORAL_TABLET | Freq: Every day | ORAL | Status: DC
  Administered 2014-04-14 – 2014-04-15 (×2): 40 mg via ORAL
  Filled 2014-04-13 (×2): qty 1

## 2014-04-13 MED ORDER — LABETALOL HCL 5 MG/ML IV SOLN
20.0000 mg | Freq: Once | INTRAVENOUS | Status: AC
  Administered 2014-04-13: 20 mg via INTRAVENOUS
  Filled 2014-04-13: qty 4

## 2014-04-13 MED ORDER — LABETALOL HCL 5 MG/ML IV SOLN
20.0000 mg | Freq: Once | INTRAVENOUS | Status: AC | PRN
  Administered 2014-04-13: 20 mg via INTRAVENOUS
  Filled 2014-04-13: qty 4

## 2014-04-13 MED ORDER — POTASSIUM CHLORIDE CRYS ER 20 MEQ PO TBCR
40.0000 meq | EXTENDED_RELEASE_TABLET | Freq: Every day | ORAL | Status: DC
  Administered 2014-04-13 – 2014-04-15 (×3): 40 meq via ORAL
  Filled 2014-04-13 (×3): qty 2

## 2014-04-13 MED ORDER — SALINE SPRAY 0.65 % NA SOLN
1.0000 | NASAL | Status: DC | PRN
  Administered 2014-04-13: 1 via NASAL
  Filled 2014-04-13: qty 44

## 2014-04-13 NOTE — Progress Notes (Signed)
Subjective: Postpartum Day 4: Cesarean Delivery Patient reports no SOB, ambulating, eating OK.    Objective: Vital signs in last 24 hours: Temp:  [97.3 F (36.3 C)-98.8 F (37.1 C)] 97.6 F (36.4 C) (11/28 0515) Pulse Rate:  [72-88] 76 (11/28 0817) Resp:  [18] 18 (11/28 0817) BP: (132-184)/(73-113) 184/113 mmHg (11/28 0817) SpO2:  [97 %-100 %] 100 % (11/28 0817)  Physical Exam:  General: alert, cooperative and no distress Lochia: appropriate Uterine Fundus: firm Incision: healing well DVT Evaluation: No evidence of DVT seen on physical exam.   Recent Labs  04/12/14 0531 04/13/14 0505  HGB 6.9* 8.7*  HCT 21.7* 26.7*    Assessment/Plan: Status post Cesarean section. CHTN: superimposed severe pre eclampsia with pulmonary edema and after load associated failurte  BP up this am, will see if settles back down. . carvedilol  25 mg Oral BID WC  . docusate sodium  100 mg Oral BID  . ferrous sulfate  325 mg Oral TID WC  . furosemide  40 mg Intravenous Q6H  . [START ON 04/14/2014] furosemide  40 mg Oral Daily  . ibuprofen  600 mg Oral 4 times per day  . lisinopril  20 mg Oral Daily  . prenatal multivitamin  1 tablet Oral Q1200  . senna-docusate  2 tablet Oral Q24H  . simethicone  80 mg Oral TID PC  . simethicone  80 mg Oral Q24H  . Tdap  0.5 mL Intramuscular Once    Chelsey Santiago 04/13/2014, 8:33 AM

## 2014-04-13 NOTE — Progress Notes (Signed)
Post Partum Day 3 Subjective:  Chelsey Santiago is a 38 y.o. [id number] [redacted]w[redacted]d s/p pLTCS/BTS.  No acute events overnight.  Pt denies problems with ambulating, voiding or po intake.  She denies nausea or vomiting.  Pain is moderately controlled.    Shortness of breath improved, still with significant edema, sad she is not going home.  Agreeable to transfusion.  Objective: Blood pressure 168/94, pulse 84, temperature 97.6 F (36.4 C), temperature source Oral, resp. rate 18, height 5\' 6"  (1.676 m), weight 280 lb (127.007 kg), last menstrual period 08/03/2013, SpO2 99 %, unknown if currently breastfeeding.  Physical Exam:  General: alert, cooperative and no distress Lochia:normal flow Chest: CTAB Heart: RRR no m/r/g Abdomen: +BS, soft, nontender,  Uterine Fundus: firm DVT Evaluation: No evidence of DVT seen on physical exam. Extremities: 3+ lower extremity edema up to proximal thigh which is improved as had previously been pitting to abdomen.   Recent Labs  04/12/14 0531 04/13/14 0505  HGB 6.9* 8.7*  HCT 21.7* 26.7*    Assessment/Plan:  ASSESSMENT: Chelsey Santiago is a 38 y.o. [id number] [redacted]w[redacted]d s/p pLTCS/BTS with cHTN with superimposed preeclampsia with severe features, pulmonary edema - plan for 2u pRBC slowly over today with caution, with 40mg  IV lasix in between for symptomatic anemia and diuresis.  Afterwards will continue 40mg  IV lasix q6h. - repeat CBC and BMP tomorrow, will supplement K if needed   LOS: 5 days   Deserea Bordley ROCIO 04/13/2014, 7:58 AM

## 2014-04-14 LAB — BASIC METABOLIC PANEL
ANION GAP: 11 (ref 5–15)
BUN: 31 mg/dL — ABNORMAL HIGH (ref 6–23)
CALCIUM: 8.2 mg/dL — AB (ref 8.4–10.5)
CO2: 29 mEq/L (ref 19–32)
Chloride: 100 mEq/L (ref 96–112)
Creatinine, Ser: 1.29 mg/dL — ABNORMAL HIGH (ref 0.50–1.10)
GFR, EST AFRICAN AMERICAN: 60 mL/min — AB (ref >90)
GFR, EST NON AFRICAN AMERICAN: 52 mL/min — AB (ref >90)
Glucose, Bld: 91 mg/dL (ref 70–99)
Potassium: 3.9 mEq/L (ref 3.7–5.3)
Sodium: 140 mEq/L (ref 137–147)

## 2014-04-14 MED ORDER — LABETALOL HCL 200 MG PO TABS
400.0000 mg | ORAL_TABLET | Freq: Three times a day (TID) | ORAL | Status: DC
  Administered 2014-04-14: 400 mg via ORAL
  Filled 2014-04-14: qty 2

## 2014-04-14 MED ORDER — LABETALOL HCL 200 MG PO TABS
200.0000 mg | ORAL_TABLET | Freq: Three times a day (TID) | ORAL | Status: DC
  Administered 2014-04-14 (×2): 200 mg via ORAL
  Filled 2014-04-14 (×2): qty 1

## 2014-04-14 NOTE — Progress Notes (Signed)
Patient ambulating in hallway pushing baby in crib with bed pillow under baby.  Reminded patient about safe sleep.  Significant other picked baby up from crib and pillow was removed.  Holding baby significant other started walking back toward room.  Reminded patient & family about  carrying baby in hallway and crib is how baby should be transported.  Will continue to monitor.  Leighton Roach, RN---------------

## 2014-04-14 NOTE — Progress Notes (Signed)
BP= 171/112  PR=87  . Patient alert and oriented.  Denies any headache, blurring of vision nor epigastric pain.  Still with edema 2++ on lower extremities.  Derrill Memo CNM and advised to give Coreg 25 mgs now and recheck BP in an hour, if still elevated will call Dr. Harolyn Rutherford.

## 2014-04-14 NOTE — Progress Notes (Signed)
Subjective: Postpartum Day 5: Cesarean Delivery Patient reports incisional pain, tolerating PO, + flatus and no problems voiding.    Objective: Vital signs in last 24 hours: Temp:  [97.5 F (36.4 C)-98.1 F (36.7 C)] 97.8 F (36.6 C) (11/29 2216) Pulse Rate:  [72-87] 72 (11/29 2216) Resp:  [17-20] 17 (11/29 2216) BP: (150-177)/(86-112) 159/95 mmHg (11/29 2216) SpO2:  [96 %-100 %] 100 % (11/29 2216) Weight:  [271 lb 12 oz (123.265 kg)] 271 lb 12 oz (123.265 kg) (11/29 0538)  Filed Vitals:   04/14/14 1358 04/14/14 1744 04/14/14 1901 04/14/14 2216  BP: 168/96 170/95 171/102 159/95  Pulse: 86 76 73 72  Temp: 97.7 F (36.5 C) 97.5 F (36.4 C)  97.8 F (36.6 C)  TempSrc: Oral Oral  Oral  Resp: 20 18  17   Height:      Weight:      SpO2: 99% 100%  100%    Physical Exam:  General: alert, cooperative and no distress Lochia: appropriate Uterine Fundus: firm Incision: dressing dry DVT Evaluation: No evidence of DVT seen on physical exam.   Recent Labs  04/12/14 0531 04/13/14 0505  HGB 6.9* 8.7*  HCT 21.7* 26.7*    Assessment/Plan: Status post Cesarean section.  Pulmonary edema secondary to hypertension and diminished LV function  BP is better understand labetalol likely to be stopped infuture but restarted to have onboard as lisinopril effects kick in Continue coreg long term and lasix .  Kody Vigil H 04/14/2014, 10:32 PM

## 2014-04-14 NOTE — Progress Notes (Signed)
Baby laying on bed pillow inside crib.  Discussed safe sleep with parents.  Referred to information in baby & me booklet.  Offered swaddling and other comfort messures for baby.  Baby sleeping at this time.  Will continue to monitor.  Leighton Roach, RN--------------------

## 2014-04-15 ENCOUNTER — Encounter: Payer: Self-pay | Admitting: *Deleted

## 2014-04-15 MED ORDER — IBUPROFEN 600 MG PO TABS: 600.0000 mg | ORAL_TABLET | Freq: Four times a day (QID) | ORAL | Status: DC

## 2014-04-15 MED ORDER — CARVEDILOL 25 MG PO TABS: 25.0000 mg | ORAL_TABLET | Freq: Two times a day (BID) | ORAL | Status: DC

## 2014-04-15 MED ORDER — LABETALOL HCL 200 MG PO TABS: 400.0000 mg | ORAL_TABLET | Freq: Three times a day (TID) | ORAL | Status: DC

## 2014-04-15 MED ORDER — POTASSIUM CHLORIDE CRYS ER 20 MEQ PO TBCR: 40.0000 meq | EXTENDED_RELEASE_TABLET | Freq: Every day | ORAL | Status: DC

## 2014-04-15 MED ORDER — OXYCODONE-ACETAMINOPHEN 5-325 MG PO TABS: 1.0000 | ORAL_TABLET | ORAL | Status: DC | PRN

## 2014-04-15 MED ORDER — PROMETHAZINE HCL 25 MG PO TABS: 25.0000 mg | ORAL_TABLET | Freq: Four times a day (QID) | ORAL | Status: DC | PRN

## 2014-04-15 MED ORDER — FUROSEMIDE 40 MG PO TABS: 40.0000 mg | ORAL_TABLET | Freq: Every day | ORAL | Status: DC

## 2014-04-15 MED ORDER — OMEPRAZOLE 20 MG PO CPDR: 20.0000 mg | DELAYED_RELEASE_CAPSULE | Freq: Every day | ORAL | Status: DC

## 2014-04-15 MED ORDER — LISINOPRIL 20 MG PO TABS: 20.0000 mg | ORAL_TABLET | Freq: Every day | ORAL | Status: DC

## 2014-04-15 NOTE — Progress Notes (Signed)
Pt discharged home with husband... Condition stable.. No equipment.. Taken to car via wheelchair by Santiago Bur, NT.

## 2014-04-15 NOTE — Discharge Instructions (Signed)

## 2014-04-17 ENCOUNTER — Encounter: Payer: Self-pay | Admitting: Advanced Practice Midwife

## 2014-04-17 ENCOUNTER — Ambulatory Visit (INDEPENDENT_AMBULATORY_CARE_PROVIDER_SITE_OTHER): Payer: Medicaid Other | Admitting: Obstetrics & Gynecology

## 2014-04-17 VITALS — BP 180/120 | Wt 278.0 lb

## 2014-04-17 DIAGNOSIS — Z9889 Other specified postprocedural states: Secondary | ICD-10-CM

## 2014-04-17 DIAGNOSIS — J811 Chronic pulmonary edema: Secondary | ICD-10-CM

## 2014-04-18 ENCOUNTER — Emergency Department (HOSPITAL_COMMUNITY)
Admission: EM | Admit: 2014-04-18 | Discharge: 2014-04-18 | Disposition: A | Discharge status: Home / Self Care | Payer: Medicaid Other | Attending: Emergency Medicine | Admitting: Emergency Medicine

## 2014-04-18 ENCOUNTER — Encounter: Payer: Medicaid Other | Admitting: Obstetrics & Gynecology

## 2014-04-18 ENCOUNTER — Emergency Department (HOSPITAL_COMMUNITY): Payer: Medicaid Other

## 2014-04-18 ENCOUNTER — Encounter (HOSPITAL_COMMUNITY): Payer: Self-pay | Admitting: *Deleted

## 2014-04-18 DIAGNOSIS — E119 Type 2 diabetes mellitus without complications: Secondary | ICD-10-CM | POA: Diagnosis not present

## 2014-04-18 DIAGNOSIS — Z7982 Long term (current) use of aspirin: Secondary | ICD-10-CM | POA: Diagnosis not present

## 2014-04-18 DIAGNOSIS — I509 Heart failure, unspecified: Secondary | ICD-10-CM | POA: Insufficient documentation

## 2014-04-18 DIAGNOSIS — M79605 Pain in left leg: Secondary | ICD-10-CM | POA: Diagnosis present

## 2014-04-18 DIAGNOSIS — I119 Hypertensive heart disease without heart failure: Secondary | ICD-10-CM | POA: Diagnosis not present

## 2014-04-18 DIAGNOSIS — G43909 Migraine, unspecified, not intractable, without status migrainosus: Secondary | ICD-10-CM | POA: Diagnosis not present

## 2014-04-18 DIAGNOSIS — Z862 Personal history of diseases of the blood and blood-forming organs and certain disorders involving the immune mechanism: Secondary | ICD-10-CM | POA: Diagnosis not present

## 2014-04-18 DIAGNOSIS — M1990 Unspecified osteoarthritis, unspecified site: Secondary | ICD-10-CM | POA: Insufficient documentation

## 2014-04-18 DIAGNOSIS — O1003 Pre-existing essential hypertension complicating the puerperium: Secondary | ICD-10-CM

## 2014-04-18 DIAGNOSIS — R600 Localized edema: Secondary | ICD-10-CM | POA: Insufficient documentation

## 2014-04-18 DIAGNOSIS — F418 Other specified anxiety disorders: Secondary | ICD-10-CM | POA: Insufficient documentation

## 2014-04-18 DIAGNOSIS — E669 Obesity, unspecified: Secondary | ICD-10-CM | POA: Diagnosis not present

## 2014-04-18 DIAGNOSIS — Z79899 Other long term (current) drug therapy: Secondary | ICD-10-CM | POA: Insufficient documentation

## 2014-04-18 DIAGNOSIS — R609 Edema, unspecified: Secondary | ICD-10-CM

## 2014-04-18 LAB — COMPREHENSIVE METABOLIC PANEL
ALBUMIN: 2.1 g/dL — AB (ref 3.5–5.2)
ALT: 29 U/L (ref 0–35)
AST: 27 U/L (ref 0–37)
Alkaline Phosphatase: 89 U/L (ref 39–117)
Anion gap: 14 (ref 5–15)
BUN: 26 mg/dL — ABNORMAL HIGH (ref 6–23)
CALCIUM: 8.1 mg/dL — AB (ref 8.4–10.5)
CHLORIDE: 102 meq/L (ref 96–112)
CO2: 25 mEq/L (ref 19–32)
Creatinine, Ser: 1.08 mg/dL (ref 0.50–1.10)
GFR calc Af Amer: 74 mL/min — ABNORMAL LOW (ref >90)
GFR calc non Af Amer: 64 mL/min — ABNORMAL LOW (ref >90)
Glucose, Bld: 98 mg/dL (ref 70–99)
Potassium: 3.5 mEq/L — ABNORMAL LOW (ref 3.7–5.3)
Sodium: 141 mEq/L (ref 137–147)
Total Protein: 5.9 g/dL — ABNORMAL LOW (ref 6.0–8.3)

## 2014-04-18 LAB — CBC WITH DIFFERENTIAL/PLATELET
BASOS ABS: 0 10*3/uL (ref 0.0–0.1)
Basophils Relative: 0 % (ref 0–1)
Eosinophils Absolute: 0.1 10*3/uL (ref 0.0–0.7)
Eosinophils Relative: 1 % (ref 0–5)
HCT: 27.7 % — ABNORMAL LOW (ref 36.0–46.0)
Hemoglobin: 8.7 g/dL — ABNORMAL LOW (ref 12.0–15.0)
Lymphocytes Relative: 21 % (ref 12–46)
Lymphs Abs: 2.2 10*3/uL (ref 0.7–4.0)
MCH: 23 pg — ABNORMAL LOW (ref 26.0–34.0)
MCHC: 31.4 g/dL (ref 30.0–36.0)
MCV: 73.1 fL — ABNORMAL LOW (ref 78.0–100.0)
MONO ABS: 0.5 10*3/uL (ref 0.1–1.0)
Monocytes Relative: 5 % (ref 3–12)
NEUTROS ABS: 7.6 10*3/uL (ref 1.7–7.7)
Neutrophils Relative %: 73 % (ref 43–77)
PLATELETS: 233 10*3/uL (ref 150–400)
RBC: 3.79 MIL/uL — ABNORMAL LOW (ref 3.87–5.11)
RDW: 16.7 % — AB (ref 11.5–15.5)
WBC: 10.5 10*3/uL (ref 4.0–10.5)

## 2014-04-18 LAB — URINALYSIS, ROUTINE W REFLEX MICROSCOPIC
Bilirubin Urine: NEGATIVE
Glucose, UA: NEGATIVE mg/dL
KETONES UR: NEGATIVE mg/dL
Nitrite: NEGATIVE
PROTEIN: NEGATIVE mg/dL
Specific Gravity, Urine: 1.01 (ref 1.005–1.030)
UROBILINOGEN UA: 0.2 mg/dL (ref 0.0–1.0)
pH: 6 (ref 5.0–8.0)

## 2014-04-18 LAB — URINE MICROSCOPIC-ADD ON

## 2014-04-18 MED ORDER — LABETALOL HCL 5 MG/ML IV SOLN
20.0000 mg | Freq: Once | INTRAVENOUS | Status: AC
  Administered 2014-04-18: 20 mg via INTRAVENOUS
  Filled 2014-04-18: qty 4

## 2014-04-18 MED ORDER — MORPHINE SULFATE 4 MG/ML IJ SOLN
4.0000 mg | Freq: Once | INTRAMUSCULAR | Status: AC
  Administered 2014-04-18: 4 mg via INTRAVENOUS
  Filled 2014-04-18: qty 1

## 2014-04-18 MED ORDER — AMLODIPINE BESYLATE 5 MG PO TABS: 10.0000 mg | ORAL_TABLET | Freq: Every day | ORAL | Status: DC

## 2014-04-18 MED ORDER — FUROSEMIDE 10 MG/ML IJ SOLN
20.0000 mg | Freq: Once | INTRAMUSCULAR | Status: AC
  Administered 2014-04-18: 20 mg via INTRAVENOUS
  Filled 2014-04-18: qty 2

## 2014-04-18 NOTE — Discharge Instructions (Signed)
Return to the emergency room with worsening of symptoms, new symptoms or with symptoms that are concerning, especially severe worsening of headache, visual or speech changes, weakness in face, arms or legs, fevers, stiff neck. Continue to take your HTN medications. Start taking amlodipine. Call to make appointment with your primary care doctor for management of your high blood pressure. If he did not have a primary care provider please call the wellness center to schedule appointment as soon as possible.   Hypertension Hypertension, commonly called high blood pressure, is when the force of blood pumping through your arteries is too strong. Your arteries are the blood vessels that carry blood from your heart throughout your body. A blood pressure reading consists of a higher number over a lower number, such as 110/72. The higher number (systolic) is the pressure inside your arteries when your heart pumps. The lower number (diastolic) is the pressure inside your arteries when your heart relaxes. Ideally you want your blood pressure below 120/80. Hypertension forces your heart to work harder to pump blood. Your arteries may become narrow or stiff. Having hypertension puts you at risk for heart disease, stroke, and other problems.  RISK FACTORS Some risk factors for high blood pressure are controllable. Others are not.  Risk factors you cannot control include:   Race. You may be at higher risk if you are African American.  Age. Risk increases with age.  Gender. Men are at higher risk than women before age 59 years. After age 24, women are at higher risk than men. Risk factors you can control include:  Not getting enough exercise or physical activity.  Being overweight.  Getting too much fat, sugar, calories, or salt in your diet.  Drinking too much alcohol. SIGNS AND SYMPTOMS Hypertension does not usually cause signs or symptoms. Extremely high blood pressure (hypertensive crisis) may cause  headache, anxiety, shortness of breath, and nosebleed. DIAGNOSIS  To check if you have hypertension, your health care provider will measure your blood pressure while you are seated, with your arm held at the level of your heart. It should be measured at least twice using the same arm. Certain conditions can cause a difference in blood pressure between your right and left arms. A blood pressure reading that is higher than normal on one occasion does not mean that you need treatment. If one blood pressure reading is high, ask your health care provider about having it checked again. TREATMENT  Treating high blood pressure includes making lifestyle changes and possibly taking medicine. Living a healthy lifestyle can help lower high blood pressure. You may need to change some of your habits. Lifestyle changes may include:  Following the DASH diet. This diet is high in fruits, vegetables, and whole grains. It is low in salt, red meat, and added sugars.  Getting at least 2 hours of brisk physical activity every week.  Losing weight if necessary.  Not smoking.  Limiting alcoholic beverages.  Learning ways to reduce stress. If lifestyle changes are not enough to get your blood pressure under control, your health care provider may prescribe medicine. You may need to take more than one. Work closely with your health care provider to understand the risks and benefits. HOME CARE INSTRUCTIONS  Have your blood pressure rechecked as directed by your health care provider.   Take medicines only as directed by your health care provider. Follow the directions carefully. Blood pressure medicines must be taken as prescribed. The medicine does not work as well when  you skip doses. Skipping doses also puts you at risk for problems.   Do not smoke.   Monitor your blood pressure at home as directed by your health care provider. SEEK MEDICAL CARE IF:   You think you are having a reaction to medicines  taken.  You have recurrent headaches or feel dizzy.  You have swelling in your ankles.  You have trouble with your vision. SEEK IMMEDIATE MEDICAL CARE IF:  You develop a severe headache or confusion.  You have unusual weakness, numbness, or feel faint.  You have severe chest or abdominal pain.  You vomit repeatedly.  You have trouble breathing. MAKE SURE YOU:   Understand these instructions.  Will watch your condition.  Will get help right away if you are not doing well or get worse. Document Released: 05/03/2005 Document Revised: 09/17/2013 Document Reviewed: 02/23/2013 [facility] Patient Information 2015 [place], [place]. This information is not intended to replace advice given to you by your health care provider. Make sure you discuss any questions you have with your health care provider.   Edema Edema is an abnormal buildup of fluids in your bodytissues. Edema is somewhatdependent on gravity to pull the fluid to the lowest place in your body. That makes the condition more common in the legs and thighs (lower extremities). Painless swelling of the feet and ankles is common and becomes more likely as you get older. It is also common in looser tissues, like around your eyes.  When the affected area is squeezed, the fluid may move out of that spot and leave a dent for a few moments. This dent is called pitting.  CAUSES  There are many possible causes of edema. Eating too much salt and being on your feet or sitting for a long time can cause edema in your legs and ankles. Hot weather may make edema worse. Common medical causes of edema include:  Heart failure.  Liver disease.  Kidney disease.  Weak blood vessels in your legs.  Cancer.  An injury.  Pregnancy.  Some medications.  Obesity. SYMPTOMS  Edema is usually painless.Your skin may look swollen or shiny.  DIAGNOSIS  Your health care provider may be able to diagnose edema by asking about your medical history  and doing a physical exam. You may need to have tests such as X-rays, an electrocardiogram, or blood tests to check for medical conditions that may cause edema.  TREATMENT  Edema treatment depends on the cause. If you have heart, liver, or kidney disease, you need the treatment appropriate for these conditions. General treatment may include:  Elevation of the affected body part above the level of your heart.  Compression of the affected body part. Pressure from elastic bandages or support stockings squeezes the tissues and forces fluid back into the blood vessels. This keeps fluid from entering the tissues.  Restriction of fluid and salt intake.  Use of a water pill (diuretic). These medications are appropriate only for some types of edema. They pull fluid out of your body and make you urinate more often. This gets rid of fluid and reduces swelling, but diuretics can have side effects. Only use diuretics as directed by your health care provider. HOME CARE INSTRUCTIONS   Keep the affected body part above the level of your heart when you are lying down.   Do not sit still or stand for prolonged periods.   Do not put anything directly under your knees when lying down.  Do not wear constricting clothing or  garters on your upper legs.   Exercise your legs to work the fluid back into your blood vessels. This may help the swelling go down.   Wear elastic bandages or support stockings to reduce ankle swelling as directed by your health care provider.   Eat a low-salt diet to reduce fluid if your health care provider recommends it.   Only take medicines as directed by your health care provider. SEEK MEDICAL CARE IF:   Your edema is not responding to treatment.  You have heart, liver, or kidney disease and notice symptoms of edema.  You have edema in your legs that does not improve after elevating them.   You have sudden and unexplained weight gain. SEEK IMMEDIATE MEDICAL CARE IF:    You develop shortness of breath or chest pain.   You cannot breathe when you lie down.  You develop pain, redness, or warmth in the swollen areas.   You have heart, liver, or kidney disease and suddenly get edema.  You have a fever and your symptoms suddenly get worse. MAKE SURE YOU:   Understand these instructions.  Will watch your condition.  Will get help right away if you are not doing well or get worse. Document Released: 05/03/2005 Document Revised: 09/17/2013 Document Reviewed: 02/23/2013 [facility] Patient Information 2015 [place], [place]. This information is not intended to replace advice given to you by your health care provider. Make sure you discuss any questions you have with your health care provider.

## 2014-04-18 NOTE — ED Notes (Signed)
Patient delivered baby x 9 days ago, patient states bilateral leg pain that is worsening and uncontrolled hypertension even on bp meds

## 2014-04-18 NOTE — ED Provider Notes (Signed)
CSN: [id number]     Arrival date & time 04/18/14  1300 History   First MD Initiated Contact with Patient 04/18/14 1519     Chief Complaint  Patient presents with  . Leg Pain  . Hypertension     (Consider location/radiation/quality/duration/timing/severity/associated sxs/prior Treatment) HPI  Chelsey Santiago is a 38 y.o. female with PMH of DM, CHF, HTN, severe preeclampsia with HELLP syndrome 9 days ago with cardiomyopathy, EF 35% without seizures and with emergent cesarean presenting with persistently elevated blood pressure and bilateral peripheral edema. Patient states she can walk but states it is painful. Patient has been taking labetalol, furosemide, carvedilol, lisinopril and BP 185/117 in ED. Patient was discharged yesterday. BP was 180/120. Patient with mild headache with significant improvement with Fioricet. Patient states it developed gradually and is like other headaches she has had before. No visual changes, slurred speech, nausea, vomiting, weakness. Patient denies chest pain and endorses mild shortness of breath. No abdominal pain, back pain, numbness, tingling, weakness.    Past Medical History  Diagnosis Date  . Hypertension     Lab: Normal BMet except glucose of 118 in 09/2010  . Hypertensive heart disease 2009    Pulmonary edema postpartum; mild to moderate mitral regurgitation when hospitalized for CHF in 2009; Echocardiogram in 12/2009-no MR and normal EF; normal CXR in 09/2010  . Migraine headache   . Anemia     H&H of 10.6/33 and 07/2008 and 11.9/35 and 09/2010  . Osteoarthritis, knee 03/29/2011  . Sleep apnea   . Depression with anxiety   . Fasting hyperglycemia   . Obesity 04/16/2009  . CHF (congestive heart failure)   . Pregnant   . Diabetes mellitus without complication   . Anxiety   . Enlarged heart   . Pulmonary edema   . Preeclampsia   . Threatened abortion in early pregnancy 03/15/2013  . Miscarriage 03/19/2013   Past Surgical History  Procedure  Laterality Date  . Breast reduction surgery  2002  . Cesarean section N/A 04/09/2014    Procedure: CESAREAN SECTION;  Surgeon: Mora Bellman, MD;  Location: [facility];  Service: Obstetrics;  Laterality: N/A;   Family History  Problem Relation Age of Onset  . Diabetes Mother   . Heart disease Mother   . Hypertension Maternal Uncle   . Hyperlipidemia Paternal Grandfather   . Hypertension Paternal Grandfather   . Sudden death Neg Hx   . Heart attack Neg Hx    History  Substance Use Topics  . Smoking status: Never Smoker   . Smokeless tobacco: Never Used  . Alcohol Use: No     Comment: occ   OB History    Gravida Para Term Preterm AB TAB SAB Ectopic Multiple Living   11 6 5 1 5 2 3   0 6     Review of Systems  Constitutional: Negative for fever and chills.  HENT: Negative for congestion and rhinorrhea.   Eyes: Negative for visual disturbance.  Respiratory: Negative for cough and shortness of breath.   Cardiovascular: Negative for chest pain and palpitations.  Gastrointestinal: Negative for nausea, vomiting and diarrhea.  Genitourinary: Negative for dysuria and hematuria.  Musculoskeletal: Negative for back pain and gait problem.  Skin: Negative for rash.  Neurological: Positive for headaches. Negative for weakness.      Allergies  Diclofenac; Tramadol; and Vicodin  Home Medications   Prior to Admission medications   Medication Sig Start Date End Date Taking? Authorizing Provider  aspirin 81 MG  tablet Take 1 tablet (81 mg total) by mouth daily. 12/13/13  Yes Christin Fudge, CNM  butalbital-acetaminophen-caffeine (FIORICET) 50-325-40 MG per tablet Take 1-2 tablets by mouth every 6 (six) hours as needed for headache. 02/15/14  Yes Jonnie Kind, MD  carvedilol (COREG) 25 MG tablet Take 1 tablet (25 mg total) by mouth 2 (two) times daily with a meal. 04/15/14  Yes Florian Buff, MD  furosemide (LASIX) 40 MG tablet Take 1 tablet (40 mg total) by mouth daily. Patient  taking differently: Take 80 mg by mouth daily.  04/15/14  Yes Florian Buff, MD  ibuprofen (ADVIL,MOTRIN) 600 MG tablet Take 1 tablet (600 mg total) by mouth every 6 (six) hours. 04/15/14  Yes Florian Buff, MD  labetalol (NORMODYNE) 200 MG tablet Take 2 tablets (400 mg total) by mouth every 8 (eight) hours. Patient taking differently: Take 600 mg by mouth every 8 (eight) hours.  04/15/14  Yes Florian Buff, MD  lisinopril (PRINIVIL,ZESTRIL) 20 MG tablet Take 1 tablet (20 mg total) by mouth daily. 04/15/14  Yes Florian Buff, MD  omeprazole (PRILOSEC) 20 MG capsule Take 1 capsule (20 mg total) by mouth daily. 04/15/14  Yes Florian Buff, MD  oxyCODONE-acetaminophen (PERCOCET/ROXICET) 5-325 MG per tablet Take 1 tablet by mouth every 4 (four) hours as needed (for pain scale less than 7). 04/15/14  Yes Florian Buff, MD  potassium chloride SA (K-DUR,KLOR-CON) 20 MEQ tablet Take 2 tablets (40 mEq total) by mouth daily. 04/15/14  Yes Florian Buff, MD  promethazine (PHENERGAN) 25 MG tablet Take 1 tablet (25 mg total) by mouth every 6 (six) hours as needed for nausea. 04/15/14 04/22/14 Yes Florian Buff, MD  venlafaxine XR (EFFEXOR-XR) 75 MG 24 hr capsule Take 1 capsule (75 mg total) by mouth daily. Patient taking differently: Take 75 mg by mouth daily as needed (For anxiety.).  11/22/13  Yes Christin Fudge, CNM  amLODipine (NORVASC) 5 MG tablet Take 2 tablets (10 mg total) by mouth daily. 04/18/14   Pura Spice, PA-C  chlorpheniramine-HYDROcodone (TUSSIONEX PENNKINETIC ER) 10-8 MG/5ML LQCR Take 5 mLs by mouth every 12 (twelve) hours as needed for cough. Patient not taking: Reported on 04/17/2014 03/28/14   Florian Buff, MD   BP 179/119 mmHg  Pulse 82  Temp(Src) 98.3 F (36.8 C) (Oral)  Resp 18  Ht 5\' 6"  (1.676 m)  Wt 278 lb (126.1 kg)  BMI 44.89 kg/m2  SpO2 100%  LMP 08/03/2013 Physical Exam  Constitutional: She appears well-developed and well-nourished. No distress.  HENT:  Head:  Normocephalic and atraumatic.  Eyes: Conjunctivae and EOM are normal. Right eye exhibits no discharge. Left eye exhibits no discharge.  Neck: Normal range of motion. No JVD present.  No nuchal rigidity  Cardiovascular: Normal rate, regular rhythm and normal heart sounds.   3+ pitting edema to bilateral knees.  Pulmonary/Chest: Effort normal and breath sounds normal. No respiratory distress. She has no wheezes.  Abdominal: Soft. Bowel sounds are normal. She exhibits no distension. There is no tenderness.  Mild diffuse abdominal tenderness without rigidity, rebound or guarding. No CVA tenderness.  Neurological: She is alert. She exhibits normal muscle tone. Coordination normal.  Speech is clear and goal oriented. Peripheral visual fields intact. Strength 5/5 in upper and lower extremities. Sensation intact. No pronator drift. Normal gait.  Skin: Skin is warm and dry. She is not diaphoretic.  Nursing note and vitals reviewed.   ED Course  Procedures (including  critical care time) Labs Review Labs Reviewed  URINALYSIS, ROUTINE W REFLEX MICROSCOPIC - Abnormal; Notable for the following:    APPearance CLOUDY (*)    Hgb urine dipstick LARGE (*)    Leukocytes, UA TRACE (*)    All other components within normal limits  CBC WITH DIFFERENTIAL - Abnormal; Notable for the following:    RBC 3.79 (*)    Hemoglobin 8.7 (*)    HCT 27.7 (*)    MCV 73.1 (*)    MCH 23.0 (*)    RDW 16.7 (*)    All other components within normal limits  COMPREHENSIVE METABOLIC PANEL - Abnormal; Notable for the following:    Potassium 3.5 (*)    BUN 26 (*)    Calcium 8.1 (*)    Total Protein 5.9 (*)    Albumin 2.1 (*)    Total Bilirubin <0.2 (*)    GFR calc non Af Amer 64 (*)    GFR calc Af Amer 74 (*)    All other components within normal limits  URINE MICROSCOPIC-ADD ON - Abnormal; Notable for the following:    Casts GRANULAR CAST (*)    All other components within normal limits  URINE CULTURE    Imaging  Review Dg Chest 2 View  04/18/2014   CLINICAL DATA:  Delivered baby 9 days ago.  Leg pain and swelling.  EXAM: CHEST  2 VIEW  COMPARISON:  03/27/2014  FINDINGS: There is mild interstitial thickening. There is no focal parenchymal opacity, pleural effusion, or pneumothorax. There is stable cardiomegaly.  The osseous structures are unremarkable.  IMPRESSION: Cardiomegaly with bilateral interstitial thickening concerning for mild interstitial edema.   Electronically Signed   By: Kathreen Devoid   On: 04/18/2014 18:08     EKG Interpretation None      MDM   Final diagnoses:  Peripheral edema  Essential hypertension-postpartum   Pt with Hypertension difficulties before pregnancy with persistent problems postpartum. Pt also with persistent peripheral edema that has not gotten worse but is not getting better per patient. Pt BP in ED 179/119. Pt with mild headache and no neurological symptoms that resolved in ED. Pt urine without protein and LFTs WNL. Kidney function baseline. CXR with mild interstitial edema which is expected with her cardiomyopathy during hospitalization. I spoke with Dr. Harolyn Rutherford who was familiar with her case. She said they had difficulty managing her BP as well despite using multiple medications. At discharge her BP was similar to her presenting BP. She was not concerned with the peripheral edema. Pt to start amlodipine 10mg  daily. Pt to follow up with her PCP for further HTN management. Patient is afebrile, nontoxic, and in no acute distress. Patient is appropriate for outpatient management and is stable for discharge.  Discussed return precautions with patient including CVA precautions. Discussed all results and patient verbalizes understanding and agrees with plan.  Case has been discussed with Dr. Stark Jock who agrees with the above plan and to discharge.        Pura Spice, PA-C 04/20/14 [name], MD 04/21/14 1005

## 2014-04-19 LAB — URINE CULTURE
COLONY COUNT: NO GROWTH
Culture: NO GROWTH

## 2014-04-22 ENCOUNTER — Encounter (HOSPITAL_COMMUNITY): Payer: Self-pay | Admitting: Cardiology

## 2014-04-22 ENCOUNTER — Ambulatory Visit (INDEPENDENT_AMBULATORY_CARE_PROVIDER_SITE_OTHER): Payer: Medicaid Other | Admitting: Obstetrics & Gynecology

## 2014-04-22 ENCOUNTER — Emergency Department (HOSPITAL_COMMUNITY): Payer: Medicaid Other

## 2014-04-22 ENCOUNTER — Encounter: Payer: Self-pay | Admitting: Obstetrics & Gynecology

## 2014-04-22 ENCOUNTER — Inpatient Hospital Stay (HOSPITAL_COMMUNITY)
Admission: EM | Admit: 2014-04-22 | Discharge: 2014-04-30 | DRG: 291 | Disposition: A | Discharge status: Home / Self Care | Payer: Medicaid Other | Attending: Family Medicine | Admitting: Family Medicine

## 2014-04-22 VITALS — BP 170/110 | Wt 286.0 lb

## 2014-04-22 DIAGNOSIS — M179 Osteoarthritis of knee, unspecified: Secondary | ICD-10-CM | POA: Diagnosis present

## 2014-04-22 DIAGNOSIS — I110 Hypertensive heart disease with heart failure: Secondary | ICD-10-CM | POA: Diagnosis present

## 2014-04-22 DIAGNOSIS — Z79891 Long term (current) use of opiate analgesic: Secondary | ICD-10-CM | POA: Diagnosis not present

## 2014-04-22 DIAGNOSIS — Z791 Long term (current) use of non-steroidal anti-inflammatories (NSAID): Secondary | ICD-10-CM | POA: Diagnosis not present

## 2014-04-22 DIAGNOSIS — I43 Cardiomyopathy in diseases classified elsewhere: Secondary | ICD-10-CM | POA: Diagnosis present

## 2014-04-22 DIAGNOSIS — D509 Iron deficiency anemia, unspecified: Secondary | ICD-10-CM | POA: Diagnosis present

## 2014-04-22 DIAGNOSIS — O2413 Pre-existing diabetes mellitus, type 2, in the puerperium: Secondary | ICD-10-CM | POA: Diagnosis present

## 2014-04-22 DIAGNOSIS — I5023 Acute on chronic systolic (congestive) heart failure: Principal | ICD-10-CM | POA: Diagnosis present

## 2014-04-22 DIAGNOSIS — Z8249 Family history of ischemic heart disease and other diseases of the circulatory system: Secondary | ICD-10-CM | POA: Diagnosis not present

## 2014-04-22 DIAGNOSIS — Z79899 Other long term (current) drug therapy: Secondary | ICD-10-CM

## 2014-04-22 DIAGNOSIS — E119 Type 2 diabetes mellitus without complications: Secondary | ICD-10-CM

## 2014-04-22 DIAGNOSIS — O9903 Anemia complicating the puerperium: Secondary | ICD-10-CM | POA: Diagnosis present

## 2014-04-22 DIAGNOSIS — Z7982 Long term (current) use of aspirin: Secondary | ICD-10-CM

## 2014-04-22 DIAGNOSIS — O2493 Unspecified diabetes mellitus in the puerperium: Secondary | ICD-10-CM | POA: Diagnosis present

## 2014-04-22 DIAGNOSIS — I5022 Chronic systolic (congestive) heart failure: Secondary | ICD-10-CM

## 2014-04-22 DIAGNOSIS — K5900 Constipation, unspecified: Secondary | ICD-10-CM | POA: Diagnosis present

## 2014-04-22 DIAGNOSIS — Z833 Family history of diabetes mellitus: Secondary | ICD-10-CM

## 2014-04-22 DIAGNOSIS — I1A0 Resistant hypertension: Secondary | ICD-10-CM | POA: Diagnosis present

## 2014-04-22 DIAGNOSIS — R0602 Shortness of breath: Secondary | ICD-10-CM

## 2014-04-22 DIAGNOSIS — J810 Acute pulmonary edema: Secondary | ICD-10-CM | POA: Diagnosis present

## 2014-04-22 DIAGNOSIS — R600 Localized edema: Secondary | ICD-10-CM

## 2014-04-22 DIAGNOSIS — O903 Peripartum cardiomyopathy: Secondary | ICD-10-CM | POA: Diagnosis present

## 2014-04-22 DIAGNOSIS — I10 Essential (primary) hypertension: Secondary | ICD-10-CM | POA: Insufficient documentation

## 2014-04-22 DIAGNOSIS — I160 Hypertensive urgency: Secondary | ICD-10-CM | POA: Diagnosis present

## 2014-04-22 DIAGNOSIS — G4733 Obstructive sleep apnea (adult) (pediatric): Secondary | ICD-10-CM | POA: Diagnosis present

## 2014-04-22 DIAGNOSIS — I509 Heart failure, unspecified: Secondary | ICD-10-CM | POA: Insufficient documentation

## 2014-04-22 DIAGNOSIS — R1032 Left lower quadrant pain: Secondary | ICD-10-CM | POA: Insufficient documentation

## 2014-04-22 LAB — CBC WITH DIFFERENTIAL/PLATELET
Basophils Absolute: 0 10*3/uL (ref 0.0–0.1)
Basophils Relative: 0 % (ref 0–1)
Eosinophils Absolute: 0.1 10*3/uL (ref 0.0–0.7)
Eosinophils Relative: 1 % (ref 0–5)
HCT: 29.3 % — ABNORMAL LOW (ref 36.0–46.0)
HEMOGLOBIN: 9.2 g/dL — AB (ref 12.0–15.0)
LYMPHS ABS: 1.6 10*3/uL (ref 0.7–4.0)
LYMPHS PCT: 17 % (ref 12–46)
MCH: 23.5 pg — ABNORMAL LOW (ref 26.0–34.0)
MCHC: 31.4 g/dL (ref 30.0–36.0)
MCV: 74.7 fL — ABNORMAL LOW (ref 78.0–100.0)
Monocytes Absolute: 0.4 10*3/uL (ref 0.1–1.0)
Monocytes Relative: 4 % (ref 3–12)
NEUTROS PCT: 78 % — AB (ref 43–77)
Neutro Abs: 7.5 10*3/uL (ref 1.7–7.7)
Platelets: 295 10*3/uL (ref 150–400)
RBC: 3.92 MIL/uL (ref 3.87–5.11)
RDW: 16.8 % — ABNORMAL HIGH (ref 11.5–15.5)
WBC: 9.5 10*3/uL (ref 4.0–10.5)

## 2014-04-22 LAB — COMPREHENSIVE METABOLIC PANEL
ALT: 24 U/L (ref 0–35)
AST: 23 U/L (ref 0–37)
Albumin: 2.3 g/dL — ABNORMAL LOW (ref 3.5–5.2)
Alkaline Phosphatase: 102 U/L (ref 39–117)
Anion gap: 11 (ref 5–15)
BUN: 21 mg/dL (ref 6–23)
CO2: 27 mEq/L (ref 19–32)
Calcium: 8.6 mg/dL (ref 8.4–10.5)
Chloride: 103 mEq/L (ref 96–112)
Creatinine, Ser: 1.35 mg/dL — ABNORMAL HIGH (ref 0.50–1.10)
GFR calc Af Amer: 57 mL/min — ABNORMAL LOW (ref >90)
GFR calc non Af Amer: 49 mL/min — ABNORMAL LOW (ref >90)
Glucose, Bld: 94 mg/dL (ref 70–99)
Potassium: 3.9 mEq/L (ref 3.7–5.3)
Sodium: 141 mEq/L (ref 137–147)
Total Bilirubin: 0.2 mg/dL — ABNORMAL LOW (ref 0.3–1.2)
Total Protein: 6.2 g/dL (ref 6.0–8.3)

## 2014-04-22 LAB — GLUCOSE, CAPILLARY: GLUCOSE-CAPILLARY: 147 mg/dL — AB (ref 70–99)

## 2014-04-22 LAB — TROPONIN I
Troponin I: <0.3 ng/mL (ref <0.30)
Troponin I: <0.3 ng/mL (ref <0.30)

## 2014-04-22 LAB — PRO B NATRIURETIC PEPTIDE: Pro B Natriuretic peptide (BNP): 2380 pg/mL — ABNORMAL HIGH (ref 0–125)

## 2014-04-22 MED ORDER — SODIUM CHLORIDE 0.9 % IJ SOLN
3.0000 mL | Freq: Two times a day (BID) | INTRAMUSCULAR | Status: DC
  Administered 2014-04-23: 3 mL via INTRAVENOUS
  Administered 2014-04-23: 10 mL via INTRAVENOUS
  Administered 2014-04-24 – 2014-04-29 (×11): 3 mL via INTRAVENOUS

## 2014-04-22 MED ORDER — ENOXAPARIN SODIUM 40 MG/0.4ML ~~LOC~~ SOLN
40.0000 mg | SUBCUTANEOUS | Status: DC
  Administered 2014-04-22 – 2014-04-29 (×8): 40 mg via SUBCUTANEOUS
  Filled 2014-04-22 (×8): qty 0.4

## 2014-04-22 MED ORDER — FUROSEMIDE 10 MG/ML IJ SOLN
40.0000 mg | Freq: Two times a day (BID) | INTRAMUSCULAR | Status: DC
Start: 2014-04-23 — End: 2014-04-23
  Administered 2014-04-23: 40 mg via INTRAVENOUS
  Filled 2014-04-22: qty 4

## 2014-04-22 MED ORDER — OXYCODONE-ACETAMINOPHEN 5-325 MG PO TABS
1.0000 | ORAL_TABLET | ORAL | Status: DC | PRN
  Administered 2014-04-23 – 2014-04-26 (×9): 1 via ORAL
  Filled 2014-04-22 (×9): qty 1

## 2014-04-22 MED ORDER — NITROGLYCERIN 0.4 MG SL SUBL
0.4000 mg | SUBLINGUAL_TABLET | Freq: Once | SUBLINGUAL | Status: AC
  Administered 2014-04-22: 0.4 mg via SUBLINGUAL
  Filled 2014-04-22: qty 1

## 2014-04-22 MED ORDER — PANTOPRAZOLE SODIUM 40 MG PO TBEC
40.0000 mg | DELAYED_RELEASE_TABLET | Freq: Every day | ORAL | Status: DC
Start: 2014-04-23 — End: 2014-04-30
  Administered 2014-04-23 – 2014-04-30 (×8): 40 mg via ORAL
  Filled 2014-04-22 (×8): qty 1

## 2014-04-22 MED ORDER — ASPIRIN 81 MG PO CHEW
324.0000 mg | CHEWABLE_TABLET | Freq: Once | ORAL | Status: AC
  Administered 2014-04-22: 324 mg via ORAL
  Filled 2014-04-22: qty 4

## 2014-04-22 MED ORDER — INSULIN ASPART 100 UNIT/ML ~~LOC~~ SOLN
0.0000 [IU] | Freq: Three times a day (TID) | SUBCUTANEOUS | Status: DC
  Administered 2014-04-24 – 2014-04-28 (×4): 2 [IU] via SUBCUTANEOUS
  Administered 2014-04-29: 3 [IU] via SUBCUTANEOUS
  Administered 2014-04-29: 8 [IU] via SUBCUTANEOUS
  Administered 2014-04-29: 2 [IU] via SUBCUTANEOUS

## 2014-04-22 MED ORDER — FUROSEMIDE 10 MG/ML IJ SOLN
40.0000 mg | Freq: Once | INTRAMUSCULAR | Status: AC
  Administered 2014-04-22: 40 mg via INTRAVENOUS
  Filled 2014-04-22: qty 4

## 2014-04-22 MED ORDER — POTASSIUM CHLORIDE CRYS ER 20 MEQ PO TBCR
40.0000 meq | EXTENDED_RELEASE_TABLET | Freq: Every day | ORAL | Status: DC
  Administered 2014-04-23 – 2014-04-30 (×8): 40 meq via ORAL
  Filled 2014-04-22 (×8): qty 2

## 2014-04-22 MED ORDER — AMLODIPINE BESYLATE 5 MG PO TABS
10.0000 mg | ORAL_TABLET | Freq: Every day | ORAL | Status: DC
  Administered 2014-04-23 – 2014-04-30 (×8): 10 mg via ORAL
  Filled 2014-04-22 (×8): qty 2

## 2014-04-22 MED ORDER — INSULIN ASPART 100 UNIT/ML ~~LOC~~ SOLN: 0.0000 [IU] | Freq: Every day | SUBCUTANEOUS | Status: DC

## 2014-04-22 MED ORDER — SODIUM CHLORIDE 0.9 % IJ SOLN
3.0000 mL | INTRAMUSCULAR | Status: DC | PRN
  Administered 2014-04-23 (×2): 3 mL via INTRAVENOUS
  Filled 2014-04-22 (×2): qty 3

## 2014-04-22 MED ORDER — SODIUM CHLORIDE 0.9 % IV SOLN: 250.0000 mL | INTRAVENOUS | Status: DC | PRN

## 2014-04-22 MED ORDER — NITROGLYCERIN IN D5W 200-5 MCG/ML-% IV SOLN
5.0000 ug/min | INTRAVENOUS | Status: DC
  Administered 2014-04-22: 5 ug/min via INTRAVENOUS
  Filled 2014-04-22: qty 250

## 2014-04-22 MED ORDER — NITROGLYCERIN IN D5W 200-5 MCG/ML-% IV SOLN
0.0000 ug/min | INTRAVENOUS | Status: DC
  Administered 2014-04-22: 10 ug/min via INTRAVENOUS

## 2014-04-22 MED ORDER — LISINOPRIL 10 MG PO TABS
20.0000 mg | ORAL_TABLET | Freq: Every day | ORAL | Status: DC
Start: 2014-04-23 — End: 2014-04-24
  Administered 2014-04-23 – 2014-04-24 (×2): 20 mg via ORAL
  Filled 2014-04-22 (×2): qty 2

## 2014-04-22 MED ORDER — CARVEDILOL 12.5 MG PO TABS
25.0000 mg | ORAL_TABLET | Freq: Two times a day (BID) | ORAL | Status: DC
  Administered 2014-04-23 – 2014-04-25 (×5): 25 mg via ORAL
  Filled 2014-04-22 (×5): qty 2

## 2014-04-22 MED ORDER — ASPIRIN 81 MG PO CHEW
81.0000 mg | CHEWABLE_TABLET | Freq: Every day | ORAL | Status: DC
  Administered 2014-04-23 – 2014-04-30 (×8): 81 mg via ORAL
  Filled 2014-04-22 (×8): qty 1

## 2014-04-22 MED ORDER — ONDANSETRON HCL 4 MG/2ML IJ SOLN
4.0000 mg | Freq: Four times a day (QID) | INTRAMUSCULAR | Status: DC | PRN
  Administered 2014-04-23 – 2014-04-29 (×5): 4 mg via INTRAVENOUS
  Filled 2014-04-22 (×6): qty 2

## 2014-04-22 MED ORDER — LABETALOL HCL 200 MG PO TABS
600.0000 mg | ORAL_TABLET | Freq: Three times a day (TID) | ORAL | Status: DC
  Administered 2014-04-22 – 2014-04-23 (×3): 600 mg via ORAL
  Filled 2014-04-22 (×3): qty 3

## 2014-04-22 MED ORDER — ACETAMINOPHEN 325 MG PO TABS
650.0000 mg | ORAL_TABLET | ORAL | Status: DC | PRN
  Administered 2014-04-24 – 2014-04-26 (×6): 650 mg via ORAL
  Filled 2014-04-22 (×6): qty 2

## 2014-04-22 NOTE — Progress Notes (Signed)
Patient ID: OZELLA COMINS, female   DOB: 1975/08/20, 38 y.o.   MRN: [id number] Pt well known with hypofunctioning left ventricle/CHF delivered 2 weeks ago with continuing afterload issues and despite aggressive therapy significant edema with exertional dysmpnea  Exam Crackles consistent with pulmonary edema  Called and talked with Dr branch and told him I was ocncerned Elmarie was not responding and concerned over post partum cardiomyopathy or just not improving her status as she should at this point  She is going to the ER and dr branch and he is meeting her there for evaluation and management

## 2014-04-22 NOTE — Progress Notes (Addendum)
Contacted by Dr Elonda Husky (her ob/gyn) regarding patient, she is currently being processed in ER. Full consult to follow in AM. From discussion progression of signs and symptoms of CHF with uncontrolled bp since recent c-section 2 weeks ago. From chart review very long history of difficult to manage bp not always related to peripartum time frame. Recent echo with LVEF 35%, unclear if peripartum CM or hypertensive CM.  If hypertensive and volume overloaded would recommend nitro gtt overnight with gradual decrease of MAP along with aggressive IV diuretics, please order repeat echo. Full consultation to follow in AM.    Zandra Abts MD

## 2014-04-22 NOTE — H&P (Signed)
[facility] History and Physical  Chelsey Santiago:[id number] DOB: 1976/01/15 DOA: 04/22/2014  Referring physician: Dr. Leonides Schanz, ER physician PCP: No PCP Per Patient Health Dept  Chief Complaint: shortness of breath  HPI: Chelsey Santiago is a 38 y.o. female who recently delivered her baby by cesarean section on 11/24. She reports that she was released from the hospital on 11/30. Her hospital course at that time was complicated by preeclampsia issues. She presents to the emergency room today with worsening lower extremity edema. She reports that she had pedal edema towards the latter part of her pregnancy, but this has progressively gotten worse, extended up her legs and into her abdomen. She is now started to feel short of breath, mostly on exertion. She has not really had any chest pain. She was noted to be severely hypertensive in the emergency room with a blood pressure of 182/119. She reports having a headache for the last several days. She denies any changes in vision. She reports that she chronically has high blood pressure since her previous pregnancy in 2009 and has been on antihypertensives since that time. Workup in the emergency room indicated a chest x-ray with possible developing interstitial edema. Since she was significantly volume overloaded and severely hypertensive, she is being admitted for further treatments.   Review of Systems:  Pertinent positives as per HPI, otherwise negative  Past Medical History  Diagnosis Date  . Hypertension     Lab: Normal BMet except glucose of 118 in 09/2010  . Hypertensive heart disease 2009    Pulmonary edema postpartum; mild to moderate mitral regurgitation when hospitalized for CHF in 2009; Echocardiogram in 12/2009-no MR and normal EF; normal CXR in 09/2010  . Migraine headache   . Anemia     H&H of 10.6/33 and 07/2008 and 11.9/35 and 09/2010  . Osteoarthritis, knee 03/29/2011  . Sleep apnea   . Depression with anxiety   . Fasting  hyperglycemia   . Obesity 04/16/2009  . CHF (congestive heart failure)   . Pregnant   . Diabetes mellitus without complication   . Anxiety   . Enlarged heart   . Pulmonary edema   . Preeclampsia   . Threatened abortion in early pregnancy 03/15/2013  . Miscarriage 03/19/2013   Past Surgical History  Procedure Laterality Date  . Breast reduction surgery  2002  . Cesarean section N/A 04/09/2014    Procedure: CESAREAN SECTION;  Surgeon: Mora Bellman, MD;  Location: [facility];  Service: Obstetrics;  Laterality: N/A;   Social History:  reports that she has never smoked. She has never used smokeless tobacco. She reports that she does not drink alcohol or use illicit drugs.  Allergies  Allergen Reactions  . Diclofenac     Edema  . Tramadol Nausea And Vomiting  . Vicodin [Hydrocodone-Acetaminophen] Nausea Only    Family History  Problem Relation Age of Onset  . Diabetes Mother   . Heart disease Mother   . Hypertension Maternal Uncle   . Hyperlipidemia Paternal Grandfather   . Hypertension Paternal Grandfather   . Sudden death Neg Hx   . Heart attack Neg Hx      Prior to Admission medications   Medication Sig Start Date End Date Taking? Authorizing Provider  amLODipine (NORVASC) 5 MG tablet Take 2 tablets (10 mg total) by mouth daily. 04/18/14   Pura Spice, PA-C  aspirin 81 MG tablet Take 1 tablet (81 mg total) by mouth daily. 12/13/13   Christin Fudge, CNM  butalbital-acetaminophen-caffeine (FIORICET) 50-325-40 MG per tablet Take 1-2 tablets by mouth every 6 (six) hours as needed for headache. 02/15/14   Jonnie Kind, MD  carvedilol (COREG) 25 MG tablet Take 1 tablet (25 mg total) by mouth 2 (two) times daily with a meal. 04/15/14   Florian Buff, MD  chlorpheniramine-HYDROcodone [facility] PENNKINETIC ER) 10-8 MG/5ML LQCR Take 5 mLs by mouth every 12 (twelve) hours as needed for cough. Patient not taking: Reported on 04/17/2014 03/28/14   Florian Buff, MD    furosemide (LASIX) 40 MG tablet Take 1 tablet (40 mg total) by mouth daily. Patient taking differently: Take 80 mg by mouth daily.  04/15/14   Florian Buff, MD  glyBURIDE (DIABETA) 5 MG tablet  04/02/14   Historical Provider, MD  ibuprofen (ADVIL,MOTRIN) 600 MG tablet Take 1 tablet (600 mg total) by mouth every 6 (six) hours. 04/15/14   Florian Buff, MD  labetalol (NORMODYNE) 200 MG tablet Take 2 tablets (400 mg total) by mouth every 8 (eight) hours. Patient taking differently: Take 600 mg by mouth every 8 (eight) hours.  04/15/14   Florian Buff, MD  lisinopril (PRINIVIL,ZESTRIL) 20 MG tablet Take 1 tablet (20 mg total) by mouth daily. 04/15/14   Florian Buff, MD  omeprazole (PRILOSEC) 20 MG capsule Take 1 capsule (20 mg total) by mouth daily. 04/15/14   Florian Buff, MD  oxyCODONE-acetaminophen (PERCOCET/ROXICET) 5-325 MG per tablet Take 1 tablet by mouth every 4 (four) hours as needed (for pain scale less than 7). 04/15/14   Florian Buff, MD  potassium chloride SA (K-DUR,KLOR-CON) 20 MEQ tablet Take 2 tablets (40 mEq total) by mouth daily. 04/15/14   Florian Buff, MD  promethazine (PHENERGAN) 25 MG tablet Take 1 tablet (25 mg total) by mouth every 6 (six) hours as needed for nausea. 04/15/14 04/22/14  Florian Buff, MD  venlafaxine XR (EFFEXOR-XR) 75 MG 24 hr capsule Take 1 capsule (75 mg total) by mouth daily. Patient taking differently: Take 75 mg by mouth daily as needed (For anxiety.).  11/22/13   Christin Fudge, CNM   Physical Exam: Filed Vitals:   04/22/14 1506  BP: 182/119  Pulse: 88  TempSrc: Oral  Resp: 19  Height: 5\' 6"  (1.676 m)  Weight: 129.729 kg (286 lb)  SpO2: 97%    Wt Readings from Last 3 Encounters:  04/22/14 129.729 kg (286 lb)  04/22/14 129.729 kg (286 lb)  04/18/14 126.1 kg (278 lb)    General:  Appears calm and comfortable Eyes: PERRL, normal lids, irises & conjunctiva ENT: grossly normal hearing, lips & tongue Neck: no LAD, masses or  thyromegaly Cardiovascular: RRR, no m/r/g. 2+ LE edema. Telemetry: SR, no arrhythmias  Respiratory: crackles at bases. Normal respiratory effort. Abdomen: soft, obese, edema in abdominal wall, bs+ Skin: no rash or induration seen on limited exam Musculoskeletal: grossly normal tone BUE/BLE Psychiatric: grossly normal mood and affect, speech fluent and appropriate Neurologic: grossly non-focal.          Labs on Admission:  Basic Metabolic Panel:  Recent Labs Lab 04/18/14 1610 04/22/14 1624  NA 141 141  K 3.5* 3.9  CL 102 103  CO2 25 27  GLUCOSE 98 94  BUN 26* 21  CREATININE 1.08 1.35*  CALCIUM 8.1* 8.6   Liver Function Tests:  Recent Labs Lab 04/18/14 1610 04/22/14 1624  AST 27 23  ALT 29 24  ALKPHOS 89 102  BILITOT <0.2* 0.2*  PROT 5.9*  6.2  ALBUMIN 2.1* 2.3*   No results for input(s): LIPASE, AMYLASE in the last 168 hours. No results for input(s): AMMONIA in the last 168 hours. CBC:  Recent Labs Lab 04/18/14 1610 04/22/14 1624  WBC 10.5 9.5  NEUTROABS 7.6 7.5  HGB 8.7* 9.2*  HCT 27.7* 29.3*  MCV 73.1* 74.7*  PLT 233 295   Cardiac Enzymes:  Recent Labs Lab 04/22/14 1624  TROPONINI <0.30    BNP (last 3 results)  Recent Labs  04/08/14 1545 04/22/14 1624  PROBNP 1854.0* 2380.0*   CBG: No results for input(s): GLUCAP in the last 168 hours.  Radiological Exams on Admission: Dg Chest 2 View  04/22/2014   CLINICAL DATA:  Post partum and history of pre-eclampsia. Shortness of breath since delivery.  EXAM: CHEST  2 VIEW  COMPARISON:  04/18/2014 and 12/23/2011  FINDINGS: Again noted is enlargement of the cardiac silhouette. Prominent central vascular structures and cannot exclude congestion or mild edema. Again noted is a prominent azygos vein shadow. Again noted is mild blunting at the right costophrenic angle but no evidence for a large pleural effusion.  IMPRESSION: Cardiomegaly with concern for vascular congestion or mild edema. The congestion or  mild edema has slightly progressed since 04/18/2014.   Electronically Signed   By: Markus Daft M.D.   On: 04/22/2014 15:53    EKG: Independently reviewed. No acute ST-T changes  Assessment/Plan Principal Problem:   Acute CHF Active Problems:   Pulmonary edema   Hypertensive urgency   1. Acute congestive heart failure. 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 Will order 2-D echocardiogram to assess ejection fraction. In the past it was noted she had an ejection fraction of 35%. She is already on a significant cardiac regimen including ACE inhibitor, beta blocker, aspirin, Lasix 80 mg by mouth daily. We'll start the patient on intravenous Lasix for more intensive diuresis. Monitor strict intake and output. Cardiology consultation. 2. Hypertensive urgency. Continue her home medications. She's been started on nitroglycerin infusion which should hopefully help lower her blood pressure. 3. Pulmonary edema. Related to #1. Should hopefully improve with diuresis. She does not have any oxygen requirement at this time.   Code Status: full code DVT Prophylaxis: lovenox Family Communication: discussed with patient Disposition Plan: discharge home once improved  Time spent: 71mins  Olusegun Gerstenberger [facility] Pager [phone]

## 2014-04-22 NOTE — Consult Note (Signed)
Primary cardiologist: Dr Chelsey Dolly MD Consulting cardiologist: Dr Chelsey Dolly MD    Clinical Summary Chelsey Santiago is a 38 y.o.female 38 yo female hx of recent C-section delivery 2 weeks ago, delivery complicated by pre-eclampsia. Seen by ob/gyn today for follow up with signs and symptoms of progressive heart failure. From echo 03/2014 LVEF 35%. She has continued to have problems with elevated blood pressures as an outpatient despite increased oral medications. I had seen her previously as an outpatient in 10/2013 earlier in her pregnancy, at that time she was on multiple bp agents to control her bp. From old cardiology notes 04/2008 with Dr Chelsey Santiago seen at that time for refractory HTN with considerations for referral to a HTN specialty clinic. From notes prior issues with heart failure with pregnancy in 2009, presumed diastolic as her LVEF at that time was 50-55%. Presents today with several week history of progressive SOB and LE edema.   -03/2014 Echo LVEF 35%, inferior wall hypokinesis, mild MR. LA reported mildly dilated by index of 48 would suggest severely dilated. Diastolic function not reported, E/e' 18 consistent with elevated LA pressure.  -Echo 12/2009 LVEF 50--55%, mild LAE -Echo 2009 LVEF mild concentric LVH, LVEF 50-55%, elevated LA pressure, mod LAE - pro-BNP 2380 trop neg x 2 K 3.9 Cr 1.35 BUN 21 GFR 57 Hgb 9.2 Plt 295 TSH pending - CXR cardiomegaly with pulm edema   Allergies  Allergen Reactions  . Diclofenac     Edema  . Tramadol Nausea And Vomiting  . Vicodin [Hydrocodone-Acetaminophen] Nausea Only    Medications Scheduled Medications:    Infusions:    PRN Medications:        Past Medical History  Diagnosis Date  . Hypertension     Lab: Normal BMet except glucose of 118 in 09/2010  . Hypertensive heart disease 2009    Pulmonary edema postpartum; mild to moderate mitral regurgitation when hospitalized for CHF in 2009; Echocardiogram in  12/2009-no MR and normal EF; normal CXR in 09/2010  . Migraine headache   . Anemia     H&H of 10.6/33 and 07/2008 and 11.9/35 and 09/2010  . Osteoarthritis, knee 03/29/2011  . Sleep apnea   . Depression with anxiety   . Fasting hyperglycemia   . Obesity 04/16/2009  . CHF (congestive heart failure)   . Pregnant   . Diabetes mellitus without complication   . Anxiety   . Enlarged heart   . Pulmonary edema   . Preeclampsia   . Threatened abortion in early pregnancy 03/15/2013  . Miscarriage 03/19/2013    Past Surgical History  Procedure Laterality Date  . Breast reduction surgery  2002  . Cesarean section N/A 04/09/2014    Procedure: CESAREAN SECTION;  Surgeon: Chelsey Bellman, MD;  Location: [facility];  Service: Obstetrics;  Laterality: N/A;    Family History  Problem Relation Age of Onset  . Diabetes Mother   . Heart disease Mother   . Hypertension Maternal Uncle   . Hyperlipidemia Paternal Grandfather   . Hypertension Paternal Grandfather   . Sudden death Neg Hx   . Heart attack Neg Hx     Social History Chelsey Santiago reports that she has never smoked. She has never used smokeless tobacco. Chelsey Santiago reports that she does not drink alcohol.  Review of Systems CONSTITUTIONAL: No weight loss, fever, chills, weakness or fatigue.  HEENT: Eyes: No visual loss, blurred vision, double vision or yellow sclerae. No hearing loss, sneezing, congestion, runny nose  or sore throat.  SKIN: No rash or itching.  CARDIOVASCULAR: No chest pain, chest pressure or chest discomfort. No palpitations or edema.  RESPIRATORY: No shortness of breath, cough or sputum.  GASTROINTESTINAL: No anorexia, nausea, vomiting or diarrhea. No abdominal pain or blood.  GENITOURINARY: no polyuria, no dysuria NEUROLOGICAL: No headache, dizziness, syncope, paralysis, ataxia, numbness or tingling in the extremities. No change in bowel or bladder control.  MUSCULOSKELETAL: No muscle, back pain, joint pain or stiffness.    HEMATOLOGIC: No anemia, bleeding or bruising.  LYMPHATICS: No enlarged nodes. No history of splenectomy.  PSYCHIATRIC: No history of depression or anxiety.      Physical Examination Last menstrual period 08/03/2013, unknown if currently breastfeeding. No intake or output data in the 24 hours ending 04/22/14 1508  HEENT sclera clear  Cardiovascular: RRR, no m/r/g, JVD to angle of jaw  Respiratory: faint crackles bilateral bases  GI: abdomen soft, NT, ND  MSK: 3+ bilateral LE edema  Neuro: no focal deficits  Psych: appropriate affect   Lab Results  Basic Metabolic Panel:  Recent Labs Lab 04/18/14 1610  NA 141  K 3.5*  CL 102  CO2 25  GLUCOSE 98  BUN 26*  CREATININE 1.08  CALCIUM 8.1*    Liver Function Tests:  Recent Labs Lab 04/18/14 1610  AST 27  ALT 29  ALKPHOS 89  BILITOT <0.2*  PROT 5.9*  ALBUMIN 2.1*    CBC:  Recent Labs Lab 04/18/14 1610  WBC 10.5  NEUTROABS 7.6  HGB 8.7*  HCT 27.7*  MCV 73.1*  PLT 233    Cardiac Enzymes: No results for input(s): CKTOTAL, CKMB, CKMBINDEX, TROPONINI in the last 168 hours.  BNP: Invalid input(s): POCBNP    Impression/Recommendations  1. Acute on chronic systolic heart failure - etiology of her systolic dysfunction is unclear. Timing of diagnosis  would suggest possible peripartum CM. However she has known longstanding severe HTN with mixed medication compliance, with evidence of severe LA enlargement suggesting more chronic cardiac stress and potential hypertensive CM. Evidence of increased LA pressures and LA enlargement date back as far as 2009 based on studies.   - she is on lasix 40mg  IV bid, negative 28 mL since admission.Will increase to 80mg  bid, pending fluid response will consider lasix gtt - severe headaches on NG gtt, will discontinue and see if can get bp control with orals alone. Start hydralazine 50mg  tid    2. HTN - long history of difficult to control HTN, part of difficulties  has been mixed compliance with medications in the past - she has been on norvasc 10mg , coreg 25mg  bid, labetalol 600mg  tid, lisionpril 20mg  as oral regimen, significantly elevated bp on admit and started on NG gtt however cannot tolerate due to headaches. Will stop nitro gtt and try to control bp with orals alone, start hydral 50mg  tid. Ideally as pressure comes down be able to get her off labetalol as she is currently on dual beta blocker therapy. - regarding secondary HTN workup, TSH is pending. Will order AM cortisol and renin/aldo levels. Will need outpatient sleep study, consideration for possible renal artery [place] vs CTA. She does report being diagnosed with OSA in 2009 and was on CPAP for period of time, but stopped on her own. - she reports she is not breast feeding and will not, therefore our options for bp meds are less limited        Chelsey Santiago, M.D.

## 2014-04-22 NOTE — ED Notes (Addendum)
Delivered 04-07-14,  Had pre-eclampsia.   States she has been sob since delivering.  C/o all over swelling.  Seen by Dr. Elonda Husky today and he sent her to er .  Wants her to see a cardiologist.

## 2014-04-22 NOTE — Plan of Care (Signed)
Problem: Consults Goal: Tobacco Cessation referral if indicated Outcome: Not Applicable Date Met:  01/58/68 Goal: Nutrition Consult-if indicated Outcome: Not Applicable Date Met:  25/74/93 Goal: Diabetes Guidelines if Diabetic/Glucose > 140 If diabetic or lab glucose is > 140 mg/dl - Initiate Diabetes/Hyperglycemia Guidelines & Document Interventions  Outcome: Completed/Met Date Met:  04/22/14  Problem: Phase I Progression Outcomes Goal: Voiding-avoid urinary catheter unless indicated Outcome: Completed/Met Date Met:  04/22/14  Problem: Phase II Progression Outcomes Goal: Tolerating diet Outcome: Completed/Met Date Met:  04/22/14

## 2014-04-22 NOTE — ED Provider Notes (Signed)
TIME SEEN: 4:30 PM  CHIEF COMPLAINT: Lower extremity swelling, shortness of breath  HPI: Pt is a 38 y.o. F with history of hypertension with prior postpartum pulmonary edema and 2009, diabetes who presents the emergency department with bilateral lower extremity swelling that is progressively worsening over the past several weeks and shortness of breath in the past several days it is worse with exertion. No chest pain. She delivered a healthy baby girl by C-section on 04/07/14. She states she did have preeclampsia during this pregnancy.  She states she was seen by Dr. Elonda Husky 2 day and was sent to the ER for further evaluation. Dr. Harl Bowie was consult. And contacted this EDP. 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  Patient denies calf tenderness. No history of PE or DVT. She is not currently tachycardic or hypoxic.  ROS: See HPI Constitutional: no fever  Eyes: no drainage  ENT: no runny nose   Cardiovascular:  no chest pain  Resp: SOB  GI: no vomiting GU: no dysuria Integumentary: no rash  Allergy: no hives  Musculoskeletal: leg swelling  Neurological: no slurred speech ROS otherwise negative  PAST MEDICAL HISTORY/PAST SURGICAL HISTORY:  Past Medical History  Diagnosis Date  . Hypertension     Lab: Normal BMet except glucose of 118 in 09/2010  . Hypertensive heart disease 2009    Pulmonary edema postpartum; mild to moderate mitral regurgitation when hospitalized for CHF in 2009; Echocardiogram in 12/2009-no MR and normal EF; normal CXR in 09/2010  . Migraine headache   . Anemia     H&H of 10.6/33 and 07/2008 and 11.9/35 and 09/2010  . Osteoarthritis, knee 03/29/2011  . Sleep apnea   . Depression with anxiety   . Fasting hyperglycemia   . Obesity 04/16/2009  . CHF (congestive heart failure)   . Pregnant   . Diabetes mellitus without complication   . Anxiety   . Enlarged heart   . Pulmonary edema   . Preeclampsia    . Threatened abortion in early pregnancy 03/15/2013  . Miscarriage 03/19/2013    MEDICATIONS:  Prior to Admission medications   Medication Sig Start Date End Date Taking? Authorizing Provider  amLODipine (NORVASC) 5 MG tablet Take 2 tablets (10 mg total) by mouth daily. 04/18/14   Pura Spice, PA-C  aspirin 81 MG tablet Take 1 tablet (81 mg total) by mouth daily. 12/13/13   Christin Fudge, CNM  butalbital-acetaminophen-caffeine (FIORICET) 50-325-40 MG per tablet Take 1-2 tablets by mouth every 6 (six) hours as needed for headache. 02/15/14   Jonnie Kind, MD  carvedilol (COREG) 25 MG tablet Take 1 tablet (25 mg total) by mouth 2 (two) times daily with a meal. 04/15/14   Florian Buff, MD  chlorpheniramine-HYDROcodone [facility] PENNKINETIC ER) 10-8 MG/5ML LQCR Take 5 mLs by mouth every 12 (twelve) hours as needed for cough. Patient not taking: Reported on 04/17/2014 03/28/14   Florian Buff, MD  furosemide (LASIX) 40 MG tablet Take 1 tablet (40 mg total) by mouth daily. Patient taking differently: Take 80 mg by mouth daily.  04/15/14   Florian Buff, MD  ibuprofen (ADVIL,MOTRIN) 600 MG tablet Take 1 tablet (600 mg total) by mouth every 6 (six) hours. 04/15/14   Florian Buff, MD  labetalol (NORMODYNE) 200 MG tablet Take 2 tablets (400 mg total) by mouth every 8 (eight) hours. Patient taking differently: Take 600 mg by mouth every 8 (eight) hours.  04/15/14  Florian Buff, MD  lisinopril (PRINIVIL,ZESTRIL) 20 MG tablet Take 1 tablet (20 mg total) by mouth daily. 04/15/14   Florian Buff, MD  omeprazole (PRILOSEC) 20 MG capsule Take 1 capsule (20 mg total) by mouth daily. 04/15/14   Florian Buff, MD  oxyCODONE-acetaminophen (PERCOCET/ROXICET) 5-325 MG per tablet Take 1 tablet by mouth every 4 (four) hours as needed (for pain scale less than 7). 04/15/14   Florian Buff, MD  potassium chloride SA (K-DUR,KLOR-CON) 20 MEQ tablet Take 2 tablets (40 mEq total) by mouth daily. 04/15/14    Florian Buff, MD  promethazine (PHENERGAN) 25 MG tablet Take 1 tablet (25 mg total) by mouth every 6 (six) hours as needed for nausea. 04/15/14 04/22/14  Florian Buff, MD  venlafaxine XR (EFFEXOR-XR) 75 MG 24 hr capsule Take 1 capsule (75 mg total) by mouth daily. Patient taking differently: Take 75 mg by mouth daily as needed (For anxiety.).  11/22/13   Christin Fudge, CNM    ALLERGIES:  Allergies  Allergen Reactions  . Diclofenac     Edema  . Tramadol Nausea And Vomiting  . Vicodin [Hydrocodone-Acetaminophen] Nausea Only    SOCIAL HISTORY:  History  Substance Use Topics  . Smoking status: Never Smoker   . Smokeless tobacco: Never Used  . Alcohol Use: No     Comment: occ    FAMILY HISTORY: Family History  Problem Relation Age of Onset  . Diabetes Mother   . Heart disease Mother   . Hypertension Maternal Uncle   . Hyperlipidemia Paternal Grandfather   . Hypertension Paternal Grandfather   . Sudden death Neg Hx   . Heart attack Neg Hx     EXAM: BP 182/119 mmHg  Pulse 88  Resp 19  Ht 5\' 6"  (1.676 m)  Wt 286 lb (129.729 kg)  BMI 46.18 kg/m2  SpO2 97%  LMP 08/03/2013 CONSTITUTIONAL: Alert and oriented and responds appropriately to questions. Well-appearing; well-nourished HEAD: Normocephalic EYES: Conjunctivae clear, PERRL ENT: normal nose; no rhinorrhea; moist mucous membranes; pharynx without lesions noted NECK: Supple, no meningismus, no LAD  CARD: RRR; S1 and S2 appreciated; no murmurs, no clicks, no rubs, no gallops RESP: Normal chest excursion without splinting or tachypnea; breath sounds equal bilaterally; no wheezes, no rhonchi, basilar crackles, no hypoxia or respiratory distress ABD/GI: Normal bowel sounds; non-distended; soft, non-tender, no rebound, no guarding; pitting edema in the abdomen, patient's C-section scar is healing and clean and dry and intact without surrounding erythema or warmth or drainage BACK:  The back appears normal and is  non-tender to palpation, there is no CVA tenderness EXT: Normal ROM in all joints; non-tender to palpation; 3+ pitting edema to bilateral lower extremities to the level of the lower abdomen; normal capillary refill; no cyanosis    SKIN: Normal color for age and race; warm NEURO: Moves all extremities equally PSYCH: The patient's mood and manner are appropriate. Grooming and personal hygiene are appropriate.  MEDICAL DECISION MAKING: Patient here with pedal edema, signs of pulmonary edema. 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 She is extremely hypertensive which appears to be chronic for her. We'll give nitroglycerin, aspirin, Lasix.  We'll obtain cardiac labs. Chest x-ray shows cardiomegaly with concern for vascular congestion, mild edema. X-ray appears worse compared to x-ray of 04/18/14. Patient reports her PCP is with the health department.  ED PROGRESS: 5:45 PM  Pt's BNP is greater than 3000 and has increased compared to prior. Troponin negative. EKG nonischemic. Discussed with Dr. Roderic Palau  with hospitalist service who recommends admission to step down. Will start nitroglycerin drip given her hypertension and volume overload.    EKG Interpretation  Date/Time:  Monday April 22 2014 17:31:17 EST Ventricular Rate:  78 PR Interval:  154 QRS Duration: 84 QT Interval:  412 QTC Calculation: 469 R Axis:   68 Text Interpretation:  Normal sinus rhythm Cannot rule out Anterior infarct , age undetermined Abnormal ECG No significant change since last tracing Confirmed by Banks Chaikin,  DO, Jacori Mulrooney [phone]) on 04/22/2014 5:44:21 PM         CRITICAL CARE Performed by: Nyra Jabs   Total critical care time: 45 minutes  Critical care time was exclusive of separately billable procedures and treating other patients.  Critical care was necessary to treat or prevent imminent or life-threatening deterioration.  Critical care was time spent personally by me on the following activities: development of  treatment plan with patient and/or surrogate as well as nursing, discussions with consultants, evaluation of patient's response to treatment, examination of patient, obtaining history from patient or surrogate, ordering and performing treatments and interventions, ordering and review of laboratory studies, ordering and review of radiographic studies, pulse oximetry and re-evaluation of patient's condition.      Haskins, DO 04/22/14 1745

## 2014-04-23 DIAGNOSIS — I10 Essential (primary) hypertension: Secondary | ICD-10-CM | POA: Diagnosis present

## 2014-04-23 DIAGNOSIS — I5023 Acute on chronic systolic (congestive) heart failure: Principal | ICD-10-CM

## 2014-04-23 LAB — GLUCOSE, CAPILLARY
GLUCOSE-CAPILLARY: 105 mg/dL — AB (ref 70–99)
Glucose-Capillary: 101 mg/dL — ABNORMAL HIGH (ref 70–99)
Glucose-Capillary: 107 mg/dL — ABNORMAL HIGH (ref 70–99)
Glucose-Capillary: 126 mg/dL — ABNORMAL HIGH (ref 70–99)

## 2014-04-23 LAB — BASIC METABOLIC PANEL
Anion gap: 13 (ref 5–15)
BUN: 19 mg/dL (ref 6–23)
CO2: 25 mEq/L (ref 19–32)
Calcium: 7.9 mg/dL — ABNORMAL LOW (ref 8.4–10.5)
Chloride: 102 mEq/L (ref 96–112)
Creatinine, Ser: 1.08 mg/dL (ref 0.50–1.10)
GFR calc Af Amer: 74 mL/min — ABNORMAL LOW (ref >90)
GFR, EST NON AFRICAN AMERICAN: 64 mL/min — AB (ref >90)
GLUCOSE: 140 mg/dL — AB (ref 70–99)
Potassium: 3.3 mEq/L — ABNORMAL LOW (ref 3.7–5.3)
SODIUM: 140 meq/L (ref 137–147)

## 2014-04-23 LAB — TROPONIN I
Troponin I: <0.3 ng/mL (ref <0.30)
Troponin I: <0.3 ng/mL (ref <0.30)

## 2014-04-23 LAB — TSH: TSH: 1.11 u[IU]/mL (ref 0.350–4.500)

## 2014-04-23 LAB — MRSA PCR SCREENING: MRSA BY PCR: NEGATIVE

## 2014-04-23 MED ORDER — FUROSEMIDE 10 MG/ML IJ SOLN
40.0000 mg | Freq: Two times a day (BID) | INTRAMUSCULAR | Status: DC
  Administered 2014-04-23 – 2014-04-25 (×4): 40 mg via INTRAVENOUS
  Filled 2014-04-23 (×4): qty 4

## 2014-04-23 MED ORDER — FUROSEMIDE 10 MG/ML IJ SOLN: 40.0000 mg | Freq: Once | INTRAMUSCULAR | Status: DC

## 2014-04-23 MED ORDER — LABETALOL HCL 200 MG PO TABS
300.0000 mg | ORAL_TABLET | Freq: Three times a day (TID) | ORAL | Status: DC
Start: 2014-04-23 — End: 2014-04-24
  Administered 2014-04-23 – 2014-04-24 (×2): 300 mg via ORAL
  Filled 2014-04-23 (×2): qty 2

## 2014-04-23 MED ORDER — HYDRALAZINE HCL 25 MG PO TABS
50.0000 mg | ORAL_TABLET | Freq: Three times a day (TID) | ORAL | Status: DC
  Administered 2014-04-23 – 2014-04-24 (×4): 50 mg via ORAL
  Filled 2014-04-23 (×4): qty 2

## 2014-04-23 MED ORDER — FUROSEMIDE 10 MG/ML IJ SOLN
80.0000 mg | Freq: Two times a day (BID) | INTRAMUSCULAR | Status: DC
  Filled 2014-04-23: qty 8

## 2014-04-23 NOTE — Progress Notes (Signed)
Patient complaining of IV site, inconvenient, would like site changed. IV in good working order. Explained to patient that medication will be turned off shortly, I could restart if she wished or just leave the one she has. Patient stated she would just leave the one she has for now.

## 2014-04-23 NOTE — Progress Notes (Signed)
Spoke with patient about importance of adhering to a fluid restriction of 1500 cc/day. Did the math with the patient and she has already had 1320 cc for the day. Patient acknowledged importance.

## 2014-04-23 NOTE — Care Management Utilization Note (Signed)
UR complete 

## 2014-04-23 NOTE — Progress Notes (Signed)
Report called to M. Glo Herring, Therapist, sports. Patient transferred to 317 in stable condition.

## 2014-04-23 NOTE — Progress Notes (Signed)
Presssure trending down to 140s/90s. Will try to wean her labetalol as she has been on dual beta blocker therapy, decrease to 300mg  tid. Room to increase hydralazine and lisinopril if needed, with her BMI coreg at 37.5mg  bid would also be appropriate as well if needed. Aldactone is an additional option given her reisistant HTN and systolic dysfunction.  Diuresed over 2 liters this morning, lasix cut back to 40mg  IV bid.  Zandra Abts MD

## 2014-04-23 NOTE — Progress Notes (Signed)
[facility] PROGRESS NOTE  Chelsey Santiago:[id number] DOB: [birth year] DOA: 04/22/2014 PCP: No PCP Per Patient  Assessment/Plan: 1. Acute on chronic systolic congestive heart failure. Ejection fraction 35% in 11/15. She appears to be diuresing well with intravenous Lasix. She is currently on Lasix 40 mg IV twice a day. She has already had 2 L of urine output this shift. Will continue with current treatments. She is already on ACE inhibitor, beta blocker. Cardiology following. 2. Resistant hypertension. On multiple agents including Norvasc, Coreg, labetalol, lisinopril. Nitroglycerin infusion discontinued due to headache. Started on hydralazine. Agree that she needs further outpatient workup for secondary causes of hypertension. She would benefit from renal Dopplers, sleep study. 3. Acute pulmonary edema secondary to #1. Improving  Code Status: full code Family Communication: discussed with patient Disposition Plan: discharge home once improved   Consultants:  Cardiology  Procedures:    Antibiotics:    HPI/Subjective: Feels better today, shortness of breath improving, complains of headache  Objective: Filed Vitals:   04/23/14 0800  BP: 170/104  Pulse: 70  Temp:   Resp: 23    Intake/Output Summary (Last 24 hours) at 04/23/14 1000 Last data filed at 04/23/14 0954  Gross per 24 hour  Intake 771.83 ml  Output   2550 ml  Net -1778.17 ml   Filed Weights   04/22/14 1506 04/22/14 2100  Weight: 129.729 kg (286 lb) 127.6 kg (281 lb 4.9 oz)    Exam:   General:  NAD  Cardiovascular: s1, s2, rrr  Respiratory: crackles at bases  Abdomen: soft, nt, nd, bs+  Musculoskeletal: 3+ pitting edema   Data Reviewed: Basic Metabolic Panel:  Recent Labs Lab 04/18/14 1610 04/22/14 1624 04/23/14 0256  NA 141 141 140  K 3.5* 3.9 3.3*  CL 102 103 102  CO2 25 27 25   GLUCOSE 98 94 140*  BUN 26* 21 19  CREATININE 1.08 1.35* 1.08  CALCIUM 8.1* 8.6 7.9*   Liver  Function Tests:  Recent Labs Lab 04/18/14 1610 04/22/14 1624  AST 27 23  ALT 29 24  ALKPHOS 89 102  BILITOT <0.2* 0.2*  PROT 5.9* 6.2  ALBUMIN 2.1* 2.3*   No results for input(s): LIPASE, AMYLASE in the last 168 hours. No results for input(s): AMMONIA in the last 168 hours. CBC:  Recent Labs Lab 04/18/14 1610 04/22/14 1624  WBC 10.5 9.5  NEUTROABS 7.6 7.5  HGB 8.7* 9.2*  HCT 27.7* 29.3*  MCV 73.1* 74.7*  PLT 233 295   Cardiac Enzymes:  Recent Labs Lab 04/22/14 1624 04/22/14 2110 04/23/14 0256 04/23/14 0855  TROPONINI <0.30 <0.30 <0.30 <0.30   BNP (last 3 results)  Recent Labs  04/08/14 1545 04/22/14 1624  PROBNP 1854.0* 2380.0*   CBG:  Recent Labs Lab 04/22/14 2113 04/23/14 0721  GLUCAP 147* 101*    Recent Results (from the past 240 hour(s))  Urine culture     Status: None   Collection Time: 04/18/14  5:20 PM  Result Value Ref Range Status   Specimen Description URINE, CLEAN CATCH  Final   Special Requests NONE  Final   Culture  Setup Time   Final    04/18/2014 23:04 Performed at [place] Performed at [facility]   Final   Culture NO GROWTH Performed at [facility]   Final   Report Status 04/19/2014 FINAL  Final  MRSA PCR Screening     Status: None   Collection Time:  04/22/14  9:38 PM  Result Value Ref Range Status   MRSA by PCR NEGATIVE NEGATIVE Final    Comment:        The GeneXpert MRSA Assay (FDA approved for NASAL specimens only), is one component of a comprehensive MRSA colonization surveillance program. It is not intended to diagnose MRSA infection nor to guide or monitor treatment for MRSA infections.      Studies: Dg Chest 2 View  04/22/2014   CLINICAL DATA:  Post partum and history of pre-eclampsia. Shortness of breath since delivery.  EXAM: CHEST  2 VIEW  COMPARISON:  04/18/2014 and 12/23/2011  FINDINGS: Again noted is enlargement of the cardiac silhouette.  Prominent central vascular structures and cannot exclude congestion or mild edema. Again noted is a prominent azygos vein shadow. Again noted is mild blunting at the right costophrenic angle but no evidence for a large pleural effusion.  IMPRESSION: Cardiomegaly with concern for vascular congestion or mild edema. The congestion or mild edema has slightly progressed since 04/18/2014.   Electronically Signed   By: Markus Daft M.D.   On: 04/22/2014 15:53    Scheduled Meds: . amLODipine  10 mg Oral Daily  . aspirin  81 mg Oral Daily  . carvedilol  25 mg Oral BID WC  . enoxaparin (LOVENOX) injection  40 mg Subcutaneous Q24H  . furosemide  40 mg Intravenous Once  . furosemide  80 mg Intravenous BID  . hydrALAZINE  50 mg Oral 3 times per day  . insulin aspart  0-15 Units Subcutaneous TID WC  . insulin aspart  0-5 Units Subcutaneous QHS  . labetalol  600 mg Oral 3 times per day  . lisinopril  20 mg Oral Daily  . pantoprazole  40 mg Oral Daily  . potassium chloride SA  40 mEq Oral Daily  . sodium chloride  3 mL Intravenous Q12H   Continuous Infusions:   Principal Problem:   Acute CHF Active Problems:   Pulmonary edema   Hypertensive urgency   Resistant hypertension   Acute on chronic systolic CHF (congestive heart failure)    Time spent: 26mins    Kasha Howeth  [facility] Pager [phone]. If 7PM-7AM, please contact night-coverage at [email or web address], password [email or web address] 04/23/2014, 10:00 AM  LOS: 1 day

## 2014-04-24 DIAGNOSIS — D509 Iron deficiency anemia, unspecified: Secondary | ICD-10-CM

## 2014-04-24 LAB — GLUCOSE, CAPILLARY
GLUCOSE-CAPILLARY: 116 mg/dL — AB (ref 70–99)
GLUCOSE-CAPILLARY: 126 mg/dL — AB (ref 70–99)
Glucose-Capillary: 96 mg/dL (ref 70–99)
Glucose-Capillary: 130 mg/dL — ABNORMAL HIGH (ref 70–99)

## 2014-04-24 LAB — BASIC METABOLIC PANEL
Anion gap: 11 (ref 5–15)
BUN: 18 mg/dL (ref 6–23)
CHLORIDE: 103 meq/L (ref 96–112)
CO2: 28 mEq/L (ref 19–32)
Calcium: 8.2 mg/dL — ABNORMAL LOW (ref 8.4–10.5)
Creatinine, Ser: 1.15 mg/dL — ABNORMAL HIGH (ref 0.50–1.10)
GFR calc Af Amer: 69 mL/min — ABNORMAL LOW (ref >90)
GFR, EST NON AFRICAN AMERICAN: 60 mL/min — AB (ref >90)
Glucose, Bld: 94 mg/dL (ref 70–99)
Potassium: 3.7 mEq/L (ref 3.7–5.3)
SODIUM: 142 meq/L (ref 137–147)

## 2014-04-24 LAB — CBC
HCT: 26.7 % — ABNORMAL LOW (ref 36.0–46.0)
Hemoglobin: 8.5 g/dL — ABNORMAL LOW (ref 12.0–15.0)
MCH: 23.5 pg — ABNORMAL LOW (ref 26.0–34.0)
MCHC: 31.8 g/dL (ref 30.0–36.0)
MCV: 73.8 fL — AB (ref 78.0–100.0)
Platelets: 290 10*3/uL (ref 150–400)
RBC: 3.62 MIL/uL — AB (ref 3.87–5.11)
RDW: 16.7 % — ABNORMAL HIGH (ref 11.5–15.5)
WBC: 7.8 10*3/uL (ref 4.0–10.5)

## 2014-04-24 MED ORDER — LISINOPRIL 10 MG PO TABS
40.0000 mg | ORAL_TABLET | Freq: Every day | ORAL | Status: DC
  Administered 2014-04-25 – 2014-04-30 (×6): 40 mg via ORAL
  Filled 2014-04-24 (×6): qty 4

## 2014-04-24 MED ORDER — HYDRALAZINE HCL 25 MG PO TABS
100.0000 mg | ORAL_TABLET | Freq: Three times a day (TID) | ORAL | Status: DC
  Administered 2014-04-24 – 2014-04-30 (×17): 100 mg via ORAL
  Filled 2014-04-24 (×17): qty 4

## 2014-04-24 NOTE — Progress Notes (Signed)
Patient ID: Chelsey Santiago, female   DOB: December 10, 1975, 38 y.o.   MRN: [id number]     Subjective:    SOB improving  Objective:   Temp:  [97.9 F (36.6 C)-98.2 F (36.8 C)] 98.1 F (36.7 C) (12/09 0424) Pulse Rate:  [67-86] 83 (12/09 0424) Resp:  [11-27] 19 (12/09 0424) BP: (139-179)/(72-112) 158/93 mmHg (12/09 0424) SpO2:  [96 %-100 %] 99 % (12/09 0424) Weight:  [274 lb 8 oz (124.512 kg)] 274 lb 8 oz (124.512 kg) (12/09 0424) Last BM Date: 04/23/14  Filed Weights   04/22/14 1506 04/22/14 2100 04/24/14 0424  Weight: 286 lb (129.729 kg) 281 lb 4.9 oz (127.6 kg) 274 lb 8 oz (124.512 kg)    Intake/Output Summary (Last 24 hours) at 04/24/14 0921 Last data filed at 04/24/14 0001  Gross per 24 hour  Intake   1320 ml  Output   4375 ml  Net  -3055 ml    Telemetry: NSR  Exam:  General: NAD  Resp: CTAB  Cardiac: RRR, no m/r/g, no JVD  GI: abdomen soft, NT, ND  MSK: 3+ bilateral LE edema  Neuro: no focal deficits   Lab Results:  Basic Metabolic Panel:  Recent Labs Lab 04/22/14 1624 04/23/14 0256 04/24/14 0539  NA 141 140 142  K 3.9 3.3* 3.7  CL 103 102 103  CO2 27 25 28   GLUCOSE 94 140* 94  BUN 21 19 18   CREATININE 1.35* 1.08 1.15*  CALCIUM 8.6 7.9* 8.2*    Liver Function Tests:  Recent Labs Lab 04/18/14 1610 04/22/14 1624  AST 27 23  ALT 29 24  ALKPHOS 89 102  BILITOT <0.2* 0.2*  PROT 5.9* 6.2  ALBUMIN 2.1* 2.3*    CBC:  Recent Labs Lab 04/18/14 1610 04/22/14 1624 04/24/14 0539  WBC 10.5 9.5 7.8  HGB 8.7* 9.2* 8.5*  HCT 27.7* 29.3* 26.7*  MCV 73.1* 74.7* 73.8*  PLT 233 295 290    Cardiac Enzymes:  Recent Labs Lab 04/22/14 2110 04/23/14 0256 04/23/14 0855  TROPONINI <0.30 <0.30 <0.30    BNP:  Recent Labs  04/08/14 1545 04/22/14 1624  PROBNP 1854.0* 2380.0*    Coagulation: No results for input(s): INR in the last 168 hours.  ECG:   Medications:   Scheduled Medications: . amLODipine  10 mg Oral Daily  .  aspirin  81 mg Oral Daily  . carvedilol  25 mg Oral BID WC  . enoxaparin (LOVENOX) injection  40 mg Subcutaneous Q24H  . furosemide  40 mg Intravenous BID  . hydrALAZINE  50 mg Oral 3 times per day  . insulin aspart  0-15 Units Subcutaneous TID WC  . insulin aspart  0-5 Units Subcutaneous QHS  . labetalol  300 mg Oral 3 times per day  . lisinopril  20 mg Oral Daily  . pantoprazole  40 mg Oral Daily  . potassium chloride SA  40 mEq Oral Daily  . sodium chloride  3 mL Intravenous Q12H     Infusions:     PRN Medications:  sodium chloride, acetaminophen, ondansetron (ZOFRAN) IV, oxyCODONE-acetaminophen, sodium chloride     Assessment/Plan   1. Acute on chronic systolic heart failure - etiology of her systolic dysfunction is unclear. Timing of diagnosis would suggest possible peripartum CM. However she has known longstanding severe HTN with mixed medication compliance, with evidence of severe LA enlargement suggesting more chronic cardiac stress and potential hypertensive CM. Evidence of increased LA pressures and LA enlargement date back as far  as 2009 based on studies.   - she is on lasix 40mg  IV bid, negative 3 liters yesterday and net negative 3 liters since admission. Overall stable renal function, continue current diuretic dosing.     2. HTN - long history of difficult to control HTN, part of difficulties has been mixed compliance with medications in the past - will stop her labetalol today, increase hydral to 100mg  tid and increase lisinopril to 40mg  daily (she already received 20mg  this AM,start 40mg  tomorrow) - follow pressures and renal function, likely add aldactone as next agent for resistant HTN and systolic dysfunction. Nitrates also are an option. - bp should also improve with continued diuresis.   - regarding secondary HTN workup, TSH 1.111. AM cortisol pending, renin/aldo level pending. She reports hx of OSA in 2009 on CPAP but stopped use some time ago, will need  repeat sleep test as outpatient. Pending initial workup may consider renal artery [place].         Carlyle Dolly, M.D.

## 2014-04-24 NOTE — Plan of Care (Signed)
Problem: Phase I Progression Outcomes Goal: Dyspnea controlled at rest (HF) Outcome: Progressing

## 2014-04-24 NOTE — Progress Notes (Signed)
PROGRESS NOTE  Chelsey Santiago HGD:[id number] DOB: [birth year] DOA: 04/22/2014 PCP: No PCP Per Patient Primary cardiologist: Dr Carlyle Dolly MD  Summary: 38 year old woman with history of difficult to control hypertension, questioned compliance, who underwent induction of labor November 2015 for preeclampsia, status post C-section 11/24, discharged 11/30. Referred to the emergency department from OB/GYN office 10/74 history of progressive lower extremity edema and shortness of breath. She was admitted for acute systolic congestive heart failure, cardiology consultation.  Assessment/Plan: 1. Acute on chronic systolic congestive heart failure. 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 Also consider hypertensive cardiomyopathy. Improving clinically. 2. Accelerated hypertension/hypertensive urgency/resistant hypertension. Much improved. Asymptomatic. Continue Norvasc, carvedilol, lisinopril, hydralazine per cardiology. Outpatient workup for secondary causes of hypertension recommended. TSH normal, cortisol and renin/aldosterone levels pending. Consideration will be given to renal artery ultrasound as an outpatient. 3. Chronic microcytic anemia stable, follow-up as an outpatient. 4. S/p induction for severe preeclampsia ultimately with C-section 11/24. Not breast-feeding. 5. Diabetes mellitus, stable.   Excellent diuresis. Still has significant volume overload. Cardiology recommends continued IV Lasix. Hydralazine and lisinopril increased, labetalol stopped.  Outpatient sleep study.  Home when improved, anticipate several days yet of diuresis.  Code Status: full code DVT prophylaxis: Lovenox Family Communication: none present Disposition Plan: home  Murray Hodgkins, MD  [facility]  Pager [phone] If 7PM-7AM, please contact night-coverage at [email or web address], password [email or web address] 04/24/2014, 10:16 AM  LOS: 2 days   Consultants:  Cardiology   Procedures:  2-D echocardiogram 04/08/2014: LVEF  35%. Severe hypokinesis of the inferior and inferolateral wall.   HPI/Subjective: Updated cardiology recommendations noted.  Feeling better but still has significant bilateral lower extremity edema.  Objective: Filed Vitals:   04/23/14 1600 04/23/14 1700 04/23/14 2128 04/24/14 0424  BP: 146/82 154/72 142/74 158/93  Pulse: 82 85 74 83  Temp:   98.2 F (36.8 C) 98.1 F (36.7 C)  TempSrc:    Oral  Resp: 15 24 20 19   Height:    5\' 6"  (1.676 m)  Weight:    124.512 kg (274 lb 8 oz)  SpO2: 100% 96% 99% 99%    Intake/Output Summary (Last 24 hours) at 04/24/14 1016 Last data filed at 04/24/14 0001  Gross per 24 hour  Intake   1320 ml  Output   2625 ml  Net  -1305 ml     Filed Weights   04/22/14 1506 04/22/14 2100 04/24/14 0424  Weight: 129.729 kg (286 lb) 127.6 kg (281 lb 4.9 oz) 124.512 kg (274 lb 8 oz)    Exam:     Afebrile, vital signs are stable. No hypoxia.   General: Appears calm, comfortable.  Psych: Alert. Speech fluent and appropriate.  CV: Regular rate and rhythm. No murmur, rub or gallop. 3+ bilateral lower extremity edema. Telemetry sinus rhythm.  Respiratory: Clear to auscultation bilaterally. No wheezes, rales or rhonchi. Normal respiratory effort.  Data Reviewed:  Urine output 4775. Fluid balance -3.843 L since admission. Basic metabolic panel unremarkable.  Chronic normocytic anemia appears to be stable.  Scheduled Meds: . amLODipine  10 mg Oral Daily  . aspirin  81 mg Oral Daily  . carvedilol  25 mg Oral BID WC  . enoxaparin (LOVENOX) injection  40 mg Subcutaneous Q24H  . furosemide  40 mg Intravenous BID  . hydrALAZINE  100 mg Oral 3 times per day  . insulin aspart  0-15 Units Subcutaneous TID WC  . insulin aspart  0-5 Units Subcutaneous QHS  . [START ON 04/25/2014] lisinopril  40 mg Oral Daily  .  pantoprazole  40 mg Oral Daily  . potassium chloride SA  40 mEq Oral Daily  . sodium chloride  3 mL Intravenous Q12H   Continuous Infusions:    Principal Problem:   Acute on chronic systolic CHF (congestive heart failure) Active Problems:   Microcytic anemia   Pulmonary edema   Hypertensive urgency   Acute CHF   Resistant hypertension   Time spent 20 minutes

## 2014-04-24 NOTE — Care Management Note (Addendum)
    Page 1 of 1   04/30/2014     11:36:14 AM CARE MANAGEMENT NOTE 04/30/2014  Patient:  Chelsey Santiago, Chelsey Santiago   Account Number:  [id number]  Date Initiated:  04/24/2014  Documentation initiated by:  Theophilus Kinds  Subjective/Objective Assessment:   Pt admitted from home with CHF. Pt lives with her children and will return home at discharge. Pt is independent with ADL's.     Action/Plan:   No CM needs noted.   Anticipated DC Date:  04/27/2014   Anticipated DC Plan:  [place]  CM consult      Choice offered to / List presented to:             Status of service:  Completed, signed off Medicare Important Message given?   (If response is "NO", the following Medicare IM given date fields will be blank) Date Medicare IM given:   Medicare IM given by:   Date Additional Medicare IM given:   Additional Medicare IM given by:    Discharge Disposition:  HOME/SELF CARE  Per UR Regulation:    If discussed at Long Length of Stay Meetings, dates discussed:    Comments:  04/30/2014 [place], RN, MSN, PCCN Pt is discharged home with self care. Pt given scale to monitor daily weights. No CM needs identified.  04/24/14 [place], RN BSN CM

## 2014-04-24 NOTE — Plan of Care (Signed)
Problem: Consults Goal: Skin Care Protocol Initiated - if Braden Score 18 or less If consults are not indicated, leave blank or document N/A  Outcome: Not Applicable Date Met:  59/27/63  Problem: Phase I Progression Outcomes Goal: Dyspnea controlled at rest (HF) Outcome: Completed/Met Date Met:  04/24/14 Goal: Pain controlled with appropriate interventions Outcome: Completed/Met Date Met:  04/24/14 Goal: EF % per last Echo/documented,Core Reminder form on chart Outcome: Completed/Met Date Met:  04/24/14 Goal: Up in chair, BRP Outcome: Completed/Met Date Met:  04/24/14 Goal: Initial discharge plan identified Outcome: Progressing Goal: Hemodynamically stable Outcome: Completed/Met Date Met:  04/24/14 Goal: Other Phase I Outcomes/Goals Outcome: Not Applicable Date Met:  94/32/00  Problem: Phase II Progression Outcomes Goal: Pain controlled Outcome: Completed/Met Date Met:  04/24/14 Goal: Dyspnea controlled with activity Outcome: Completed/Met Date Met:  04/24/14 Goal: Walk in hall or up in chair TID Outcome: Completed/Met Date Met:  04/24/14 Goal: Fluid volume status improved Outcome: Progressing

## 2014-04-25 LAB — BASIC METABOLIC PANEL
ANION GAP: 11 (ref 5–15)
BUN: 17 mg/dL (ref 6–23)
CALCIUM: 8.4 mg/dL (ref 8.4–10.5)
CO2: 28 mEq/L (ref 19–32)
Chloride: 104 mEq/L (ref 96–112)
Creatinine, Ser: 0.94 mg/dL (ref 0.50–1.10)
GFR calc Af Amer: 88 mL/min — ABNORMAL LOW (ref >90)
GFR, EST NON AFRICAN AMERICAN: 76 mL/min — AB (ref >90)
Glucose, Bld: 116 mg/dL — ABNORMAL HIGH (ref 70–99)
POTASSIUM: 3.9 meq/L (ref 3.7–5.3)
SODIUM: 143 meq/L (ref 137–147)

## 2014-04-25 LAB — GLUCOSE, CAPILLARY
GLUCOSE-CAPILLARY: 101 mg/dL — AB (ref 70–99)
Glucose-Capillary: 113 mg/dL — ABNORMAL HIGH (ref 70–99)
Glucose-Capillary: 104 mg/dL — ABNORMAL HIGH (ref 70–99)
Glucose-Capillary: 133 mg/dL — ABNORMAL HIGH (ref 70–99)

## 2014-04-25 LAB — CORTISOL-AM, BLOOD: Cortisol - AM: 12.6 ug/dL (ref 4.3–22.4)

## 2014-04-25 MED ORDER — CARVEDILOL 12.5 MG PO TABS
37.5000 mg | ORAL_TABLET | Freq: Two times a day (BID) | ORAL | Status: DC
  Administered 2014-04-25 – 2014-04-30 (×10): 37.5 mg via ORAL
  Filled 2014-04-25 (×10): qty 3

## 2014-04-25 MED ORDER — FUROSEMIDE 10 MG/ML IJ SOLN
20.0000 mg | Freq: Two times a day (BID) | INTRAMUSCULAR | Status: DC
  Administered 2014-04-25 – 2014-04-29 (×9): 20 mg via INTRAVENOUS
  Filled 2014-04-25 (×9): qty 2

## 2014-04-25 MED ORDER — PROMETHAZINE HCL 12.5 MG PO TABS
12.5000 mg | ORAL_TABLET | Freq: Four times a day (QID) | ORAL | Status: DC | PRN
  Administered 2014-04-25 – 2014-04-27 (×3): 12.5 mg via ORAL
  Filled 2014-04-25 (×3): qty 1

## 2014-04-25 MED ORDER — POLYETHYLENE GLYCOL 3350 17 G PO PACK
17.0000 g | PACK | Freq: Two times a day (BID) | ORAL | Status: DC
  Administered 2014-04-25 – 2014-04-28 (×5): 17 g via ORAL
  Filled 2014-04-25 (×10): qty 1

## 2014-04-25 MED ORDER — CARVEDILOL 12.5 MG PO TABS
12.5000 mg | ORAL_TABLET | Freq: Once | ORAL | Status: AC
  Administered 2014-04-25: 12.5 mg via ORAL
  Filled 2014-04-25: qty 1

## 2014-04-25 NOTE — Progress Notes (Signed)
Patient c/o of not being able to have a BM.  DrMarland Kitchen Sarajane Jews notified.  Received order for Miralax.

## 2014-04-25 NOTE — Progress Notes (Signed)
Patient ID: Chelsey Santiago, female   DOB: Feb 10, 1976, 38 y.o.   MRN: [id number]     Subjective:    Headache overnight, did not get much rest. SOB continued to improve  Objective:   Temp:  [97.5 F (36.4 C)-98.3 F (36.8 C)] 98.3 F (36.8 C) (12/10 0500) Pulse Rate:  [78-88] 88 (12/10 0500) Resp:  [18-20] 20 (12/10 0500) BP: (150-178)/(87-101) 158/93 mmHg (12/10 0730) SpO2:  [99 %-100 %] 100 % (12/10 0500) Weight:  [263 lb 14.4 oz (119.704 kg)] 263 lb 14.4 oz (119.704 kg) (12/10 0700) Last BM Date: 04/23/14  Filed Weights   04/22/14 2100 04/24/14 0424 04/25/14 0700  Weight: 281 lb 4.9 oz (127.6 kg) 274 lb 8 oz (124.512 kg) 263 lb 14.4 oz (119.704 kg)    Intake/Output Summary (Last 24 hours) at 04/25/14 0945 Last data filed at 04/24/14 2300  Gross per 24 hour  Intake    480 ml  Output   5500 ml  Net  -5020 ml    Telemetry: NSR  Exam:  General: NAD  Resp: CTAB  Cardiac: RRR, no m/r/g, no JVD, no carotid bruits  GI: abdomen soft, NT, ND  MSK: 3+ bilateral LE edema  Neuro: no focal deficits   Lab Results:  Basic Metabolic Panel:  Recent Labs Lab 04/23/14 0256 04/24/14 0539 04/25/14 0608  NA 140 142 143  K 3.3* 3.7 3.9  CL 102 103 104  CO2 25 28 28   GLUCOSE 140* 94 116*  BUN 19 18 17   CREATININE 1.08 1.15* 0.94  CALCIUM 7.9* 8.2* 8.4    Liver Function Tests:  Recent Labs Lab 04/18/14 1610 04/22/14 1624  AST 27 23  ALT 29 24  ALKPHOS 89 102  BILITOT <0.2* 0.2*  PROT 5.9* 6.2  ALBUMIN 2.1* 2.3*    CBC:  Recent Labs Lab 04/18/14 1610 04/22/14 1624 04/24/14 0539  WBC 10.5 9.5 7.8  HGB 8.7* 9.2* 8.5*  HCT 27.7* 29.3* 26.7*  MCV 73.1* 74.7* 73.8*  PLT 233 295 290    Cardiac Enzymes:  Recent Labs Lab 04/22/14 2110 04/23/14 0256 04/23/14 0855  TROPONINI <0.30 <0.30 <0.30    BNP:  Recent Labs  04/08/14 1545 04/22/14 1624  PROBNP 1854.0* 2380.0*    Coagulation: No results for input(s): INR in the last 168  hours.  ECG:   Medications:   Scheduled Medications: . amLODipine  10 mg Oral Daily  . aspirin  81 mg Oral Daily  . carvedilol  25 mg Oral BID WC  . enoxaparin (LOVENOX) injection  40 mg Subcutaneous Q24H  . furosemide  40 mg Intravenous BID  . hydrALAZINE  100 mg Oral 3 times per day  . insulin aspart  0-15 Units Subcutaneous TID WC  . insulin aspart  0-5 Units Subcutaneous QHS  . lisinopril  40 mg Oral Daily  . pantoprazole  40 mg Oral Daily  . potassium chloride SA  40 mEq Oral Daily  . sodium chloride  3 mL Intravenous Q12H     Infusions:     PRN Medications:  sodium chloride, acetaminophen, ondansetron (ZOFRAN) IV, oxyCODONE-acetaminophen, sodium chloride     Assessment/Plan   . Acute on chronic systolic heart failure - etiology of her systolic dysfunction is unclear. Timing of diagnosis would suggest possible peripartum CM. However she has known longstanding severe HTN with mixed medication compliance, with evidence of severe LA enlargement suggesting more chronic cardiac stress and potential hypertensive CM. Evidence of increased LA pressures and LA enlargement  date back as far as 2009 based on studies.   - she is on lasix 40mg  IV bid, negative 4.8 liters yesterday and net negative 7.8 liters since admission. Renal function improving with diuresis. Nearly negative 5 liters yesterday above daily net negative goal and increases risk for AKI, decrease lasix to 20mg  IV bid. Goal net negative 2-3 literes per day. She has asked to continue foley, today is day #2. Consider pulling tomorrow.     2. HTN - long history of difficult to control HTN, part of difficulties has been mixed compliance with medications in the past - she is now off labetalol, had previously been on dual beta blockers, remains on coreg. - Elevated blood pressures, lisinopril increase to 40mg  today. Increase coreg to 37.5mg  bid, with her BMI this is acceptable dosing - follow bp today, pending results  likely start aldactone tomorrow in setting of systolic dysfunction and resistant HTN. Consider nitrates to combine with her hydralazine.   - regarding secondary HTN workup, TSH 1.111. AM cortisol 12.6, renin/aldo level pending. She reports hx of OSA in 2009 on CPAP but stopped use some time ago, will need repeat sleep test as outpatient. Pending initial workup may consider renal artery [place].  - bp should continue to improve with continued diuresis as well        Carlyle Dolly, M.D.

## 2014-04-25 NOTE — Progress Notes (Signed)
PROGRESS NOTE  Chelsey Santiago [id number]:[id number] DOB: 1975/09/02 DOA: 04/22/2014 PCP: No PCP Per Patient Primary cardiologist: Dr Carlyle Dolly MD  Summary: 38 year old woman with history of difficult to control hypertension, questioned compliance, who underwent induction of labor November 2015 for preeclampsia, status post C-section 11/24, discharged 11/30. Referred to the emergency department from OB/GYN office 10/74 history of progressive lower extremity edema and shortness of breath. She was admitted for acute systolic congestive heart failure, cardiology consultation.  Assessment/Plan: 1. Acute on chronic systolic congestive heart failure. 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 Also consider hypertensive cardiomyopathy. Continues to improve with diuresis. 2. Accelerated hypertension/hypertensive urgency/resistant hypertension. Resolved. Continue Norvasc, carvedilol, lisinopril, hydralazine per cardiology. Outpatient workup for secondary causes of hypertension recommended. TSH normal, cortisol normal; renin/aldosterone levels pending. Consideration will be given to renal artery ultrasound as an outpatient. 3. Chronic microcytic anemia stable, follow-up as an outpatient. 4. S/p induction for severe preeclampsia ultimately with C-section 11/24. Not breast-feeding. 5. Diabetes mellitus, stable.   Overall improving. Cardiology has decreased Lasix based on goal urine output 2-3 liters per day. Overall her blood pressure control has improved. Lisinopril is been increased as has Coreg.  Plan to continue IV diuresis.  Code Status: full code DVT prophylaxis: Lovenox Family Communication: none present Disposition Plan: home  Murray Hodgkins, MD  [facility]  Pager [phone] If 7PM-7AM, please contact night-coverage at [email or web address], password [facility] 04/25/2014, 4:49 PM  LOS: 3 days   Consultants:  Cardiology   Procedures:  2-D echocardiogram 04/08/2014: LVEF 35%. Severe hypokinesis of  the inferior and inferolateral wall.   HPI/Subjective: Overall feeling okay but has had some nausea and vomiting today. No abdominal pain. Thinks she may have eaten some bad food last night. Continues to diurese very well.  Objective: Filed Vitals:   04/25/14 0500 04/25/14 0700 04/25/14 0730 04/25/14 1505  BP: 178/101  158/93 132/70  Pulse: 88   81  Temp: 98.3 F (36.8 C)   98 F (36.7 C)  TempSrc: Oral   Oral  Resp: 20   20  Height:      Weight:  119.704 kg (263 lb 14.4 oz)    SpO2: 100%   100%    Intake/Output Summary (Last 24 hours) at 04/25/14 1649 Last data filed at 04/25/14 1355  Gross per 24 hour  Intake    480 ml  Output   7750 ml  Net  -7270 ml     Filed Weights   04/22/14 2100 04/24/14 0424 04/25/14 0700  Weight: 127.6 kg (281 lb 4.9 oz) 124.512 kg (274 lb 8 oz) 119.704 kg (263 lb 14.4 oz)    Exam:     Afebrile, vital signs stable. No hypoxia.  Alert. Speech fluent and clear.  Cardiovascular regular rate and rhythm. No murmur, rub or gallop. Lower extremity edema improved left greater than right. Still 2-3 plus.  Respiratory clear to auscultation bilaterally. No wheezes, rales or rhonchi. Normal respiratory effort.  Abdomen soft, nontender, nondistended.  Data Reviewed:  -5.5 L overnight. -12 L since admission.  Basic metabolic panel unremarkable. Capillary blood sugar stable.  Scheduled Meds: . amLODipine  10 mg Oral Daily  . aspirin  81 mg Oral Daily  . carvedilol  37.5 mg Oral BID WC  . enoxaparin (LOVENOX) injection  40 mg Subcutaneous Q24H  . furosemide  20 mg Intravenous BID  . hydrALAZINE  100 mg Oral 3 times per day  . insulin aspart  0-15 Units Subcutaneous TID WC  . insulin aspart  0-5 Units  Subcutaneous QHS  . lisinopril  40 mg Oral Daily  . pantoprazole  40 mg Oral Daily  . potassium chloride SA  40 mEq Oral Daily  . sodium chloride  3 mL Intravenous Q12H   Continuous Infusions:   Principal Problem:   Acute on chronic  systolic CHF (congestive heart failure) Active Problems:   Microcytic anemia   Pulmonary edema   Hypertensive urgency   Acute CHF   Resistant hypertension   Time spent 15 minutes

## 2014-04-26 DIAGNOSIS — I10 Essential (primary) hypertension: Secondary | ICD-10-CM | POA: Insufficient documentation

## 2014-04-26 LAB — GLUCOSE, CAPILLARY
GLUCOSE-CAPILLARY: 106 mg/dL — AB (ref 70–99)
GLUCOSE-CAPILLARY: 134 mg/dL — AB (ref 70–99)
Glucose-Capillary: 93 mg/dL (ref 70–99)
Glucose-Capillary: 129 mg/dL — ABNORMAL HIGH (ref 70–99)

## 2014-04-26 LAB — BASIC METABOLIC PANEL
Anion gap: 11 (ref 5–15)
BUN: 14 mg/dL (ref 6–23)
CALCIUM: 9 mg/dL (ref 8.4–10.5)
CO2: 29 mEq/L (ref 19–32)
CREATININE: 0.84 mg/dL (ref 0.50–1.10)
Chloride: 101 mEq/L (ref 96–112)
GFR, EST NON AFRICAN AMERICAN: 87 mL/min — AB (ref >90)
Glucose, Bld: 102 mg/dL — ABNORMAL HIGH (ref 70–99)
Potassium: 4.1 mEq/L (ref 3.7–5.3)
Sodium: 141 mEq/L (ref 137–147)

## 2014-04-26 MED ORDER — LORAZEPAM 0.5 MG PO TABS: 0.5000 mg | ORAL_TABLET | Freq: Four times a day (QID) | ORAL | Status: DC | PRN

## 2014-04-26 MED ORDER — OXYCODONE HCL 5 MG PO TABS
5.0000 mg | ORAL_TABLET | ORAL | Status: DC | PRN
  Administered 2014-04-26 – 2014-04-27 (×3): 5 mg via ORAL
  Filled 2014-04-26 (×3): qty 1

## 2014-04-26 MED ORDER — OXYCODONE-ACETAMINOPHEN 5-325 MG PO TABS
1.0000 | ORAL_TABLET | ORAL | Status: DC | PRN
Start: 2014-04-26 — End: 2014-04-26

## 2014-04-26 MED ORDER — LORAZEPAM 2 MG/ML IJ SOLN
0.5000 mg | Freq: Once | INTRAMUSCULAR | Status: AC
  Administered 2014-04-26: 0.5 mg via INTRAVENOUS
  Filled 2014-04-26: qty 1

## 2014-04-26 NOTE — Care Management Utilization Note (Signed)
UR complete 

## 2014-04-26 NOTE — Progress Notes (Signed)
PROGRESS NOTE  Chelsey Santiago WRU:[id number] DOB: 1975-09-05 DOA: 04/22/2014 PCP: No PCP Per Patient Primary cardiologist: Dr Carlyle Dolly MD  Summary: 38 year old woman with history of difficult to control hypertension, questioned compliance, who underwent induction of labor November 2015 for preeclampsia, status post C-section 11/24, discharged 11/30. Referred to the emergency department from OB/GYN office 10/74 history of progressive lower extremity edema and shortness of breath. She was admitted for acute systolic congestive heart failure, cardiology consultation.  Assessment/Plan: 1. Acute on chronic systolic congestive heart failure. 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 Also consider hypertensive cardiomyopathy. Excellent diuresis. Clinically improving. 2. Accelerated hypertension/hypertensive urgency/resistant hypertension. Resolved. Continue Norvasc, carvedilol, lisinopril, hydralazine per cardiology. Outpatient workup for secondary causes of hypertension recommended. TSH normal, cortisol normal; renin/aldosterone levels pending. Consideration will be given to renal artery ultrasound as an outpatient. Stable. 3. Chronic microcytic anemia stable, follow-up as an outpatient. 4. S/p induction for severe preeclampsia ultimately with C-section 11/24. Not breast-feeding. 5. Diabetes mellitus, capillary blood sugars remain stable.   Overall continues to improve with excellent diuresis, management of diuretics per cardiology. Blood pressure seems to be stable at this point. Blood pressure appears stable at this point.  Code Status: full code DVT prophylaxis: Lovenox Family Communication: none present Disposition Plan: home  Murray Hodgkins, MD  [facility]  Pager [phone] If 7PM-7AM, please contact night-coverage at [email or web address], password [facility] 04/26/2014, 7:41 AM  LOS: 4 days   Consultants:  Cardiology   Procedures:  2-D echocardiogram 04/08/2014: LVEF 35%. Severe  hypokinesis of the inferior and inferolateral wall.   HPI/Subjective: Has some nausea and vomiting earlier which she associates with saline injection. Tolerating Percocet. Tolerating diet. Overall feeling better. Very pleased with weight loss. No abdominal pain. Does complain of headache.  Objective: Filed Vitals:   04/25/14 1505 04/25/14 1713 04/25/14 2034 04/26/14 0526  BP: 132/70 137/87 137/81 155/86  Pulse: 81 87 83 88  Temp: 98 F (36.7 C)  98.6 F (37 C) 97.7 F (36.5 C)  TempSrc: Oral  Oral Oral  Resp: 20  20   Height:      Weight:    114.352 kg (252 lb 1.6 oz)  SpO2: 100%  99% 100%    Intake/Output Summary (Last 24 hours) at 04/26/14 0741 Last data filed at 04/26/14 0517  Gross per 24 hour  Intake    720 ml  Output   6450 ml  Net  -5730 ml     Filed Weights   04/24/14 0424 04/25/14 0700 04/26/14 0526  Weight: 124.512 kg (274 lb 8 oz) 119.704 kg (263 lb 14.4 oz) 114.352 kg (252 lb 1.6 oz)    Exam:     Afebrile, vital signs are stable. No hypoxia.  Appears calm, comfortable. Speech fluent and clear.  Cardiovascular regular rate and rhythm. No murmur, rub or gallop. 3+ bilateral lower extremity edema all the way up to the abdomen.  Respiratory clear to auscultation bilaterally. No wheezes, rales or rhonchi. Normal respiratory effort.  Abdomen with some body wall edema.  Data Reviewed:  Weight down approximately 13 kg  Urine output 6.4 L  Basic metabolic panel unremarkable.  Scheduled Meds: . amLODipine  10 mg Oral Daily  . aspirin  81 mg Oral Daily  . carvedilol  37.5 mg Oral BID WC  . enoxaparin (LOVENOX) injection  40 mg Subcutaneous Q24H  . furosemide  20 mg Intravenous BID  . hydrALAZINE  100 mg Oral 3 times per day  . insulin aspart  0-15 Units Subcutaneous TID WC  .  insulin aspart  0-5 Units Subcutaneous QHS  . lisinopril  40 mg Oral Daily  . pantoprazole  40 mg Oral Daily  . polyethylene glycol  17 g Oral BID  . potassium chloride SA  40  mEq Oral Daily  . sodium chloride  3 mL Intravenous Q12H   Continuous Infusions:   Principal Problem:   Acute on chronic systolic CHF (congestive heart failure) Active Problems:   Microcytic anemia   Pulmonary edema   Hypertensive urgency   Acute CHF   Resistant hypertension   Time spent 20 minutes

## 2014-04-26 NOTE — Progress Notes (Signed)
Patient ID: SARANNE CRISLIP, female   DOB: [birth year], 38 y.o.   MRN: [id number]     Subjective:    SOB improving  Objective:   Temp:  [97.7 F (36.5 C)-98.6 F (37 C)] 97.7 F (36.5 C) (12/11 0526) Pulse Rate:  [81-88] 88 (12/11 0526) Resp:  [20] 20 (12/10 2034) BP: (132-155)/(70-87) 155/86 mmHg (12/11 0526) SpO2:  [99 %-100 %] 100 % (12/11 0526) Weight:  [252 lb 1.6 oz (114.352 kg)] 252 lb 1.6 oz (114.352 kg) (12/11 0526) Last BM Date: 04/22/14  Filed Weights   04/24/14 0424 04/25/14 0700 04/26/14 0526  Weight: 274 lb 8 oz (124.512 kg) 263 lb 14.4 oz (119.704 kg) 252 lb 1.6 oz (114.352 kg)    Intake/Output Summary (Last 24 hours) at 04/26/14 0953 Last data filed at 04/26/14 0935  Gross per 24 hour  Intake    960 ml  Output   6450 ml  Net  -5490 ml    Exam:  General: NAD  Resp: CTAB  Cardiac: RRR, no m/r/g, no JVD  GI: abdomen soft, NT, ND  MSK: 3+ bilateral LE edema  Neuro: no focal deficits    Lab Results:  Basic Metabolic Panel:  Recent Labs Lab 04/24/14 0539 04/25/14 0608 04/26/14 0855  NA 142 143 141  K 3.7 3.9 4.1  CL 103 104 101  CO2 28 28 29   GLUCOSE 94 116* 102*  BUN 18 17 14   CREATININE 1.15* 0.94 0.84  CALCIUM 8.2* 8.4 9.0    Liver Function Tests:  Recent Labs Lab 04/22/14 1624  AST 23  ALT 24  ALKPHOS 102  BILITOT 0.2*  PROT 6.2  ALBUMIN 2.3*    CBC:  Recent Labs Lab 04/22/14 1624 04/24/14 0539  WBC 9.5 7.8  HGB 9.2* 8.5*  HCT 29.3* 26.7*  MCV 74.7* 73.8*  PLT 295 290    Cardiac Enzymes:  Recent Labs Lab 04/22/14 2110 04/23/14 0256 04/23/14 0855  TROPONINI <0.30 <0.30 <0.30    BNP:  Recent Labs  04/08/14 1545 04/22/14 1624  PROBNP 1854.0* 2380.0*    Coagulation: No results for input(s): INR in the last 168 hours.  ECG:   Medications:   Scheduled Medications: . amLODipine  10 mg Oral Daily  . aspirin  81 mg Oral Daily  . carvedilol  37.5 mg Oral BID WC  . enoxaparin (LOVENOX)  injection  40 mg Subcutaneous Q24H  . furosemide  20 mg Intravenous BID  . hydrALAZINE  100 mg Oral 3 times per day  . insulin aspart  0-15 Units Subcutaneous TID WC  . insulin aspart  0-5 Units Subcutaneous QHS  . lisinopril  40 mg Oral Daily  . pantoprazole  40 mg Oral Daily  . polyethylene glycol  17 g Oral BID  . potassium chloride SA  40 mEq Oral Daily  . sodium chloride  3 mL Intravenous Q12H     Infusions:     PRN Medications:  sodium chloride, acetaminophen, ondansetron (ZOFRAN) IV, oxyCODONE-acetaminophen, promethazine, sodium chloride     Assessment/Plan   1. Acute on chronic systolic heart failure - etiology of her systolic dysfunction is unclear. Timing of diagnosis would suggest possible peripartum CM. However she has known longstanding severe HTN with mixed medication compliance, with evidence of severe LA enlargement suggesting more chronic cardiac stress and potential hypertensive CM. Evidence of increased LA pressures and LA enlargement date back as far as 2009 based on studies.   - she is on lasix 20 mg IV  bid (she received 40mg  in AM and 20 in PM yesterda), negative 5.7  liters yesterday and net negative 13.3 liters since admission. Renal function improving with diuresis. Continue lasix 20mg  IV bid. Still massively overloaded, anticipate several more days of IV diuresis.    2. HTN - long history of difficult to control HTN, part of difficulties has been mixed compliance with medications in the past - bp elevated yesterday AM, at goal on evening checks. We have made multiple change in meds during admit including stopping her labetalol, starting hydral and titrating up to 100mg  tid, increase coreg to 37.5mg  bid (acceptable dose based on her BMI), and increasing her lisinopril to 40mg  daily - continue to follow bp's and renal function as she adjusts to these meds, we have aldactone and nitrates that remain available and would be beneficial for her systolic  dysfunction if needed. Hold on starting today to allow her to adjust to several recent med changes. If elevated bp over weekend would start aldactone 25mg  daily.   - regarding secondary HTN workup, TSH 1.111. AM cortisol 12.6, renin/aldo level pending. She reports hx of OSA in 2009 on CPAP but stopped use some time ago, will need repeat sleep test as outpatient. Pending initial workup may consider renal artery [place].  - bp should continue to improve with continued diuresis as well         Carlyle Dolly, M.D.

## 2014-04-27 ENCOUNTER — Inpatient Hospital Stay (HOSPITAL_COMMUNITY): Payer: Medicaid Other

## 2014-04-27 DIAGNOSIS — E119 Type 2 diabetes mellitus without complications: Secondary | ICD-10-CM

## 2014-04-27 LAB — URINALYSIS, ROUTINE W REFLEX MICROSCOPIC
BILIRUBIN URINE: NEGATIVE
Glucose, UA: NEGATIVE mg/dL
Ketones, ur: NEGATIVE mg/dL
Nitrite: NEGATIVE
PH: 8.5 — AB (ref 5.0–8.0)
Protein, ur: 100 mg/dL — AB
Specific Gravity, Urine: 1.02 (ref 1.005–1.030)
Urobilinogen, UA: 0.2 mg/dL (ref 0.0–1.0)

## 2014-04-27 LAB — GLUCOSE, CAPILLARY
GLUCOSE-CAPILLARY: 118 mg/dL — AB (ref 70–99)
Glucose-Capillary: 134 mg/dL — ABNORMAL HIGH (ref 70–99)
Glucose-Capillary: 109 mg/dL — ABNORMAL HIGH (ref 70–99)
Glucose-Capillary: 127 mg/dL — ABNORMAL HIGH (ref 70–99)

## 2014-04-27 LAB — BASIC METABOLIC PANEL
ANION GAP: 11 (ref 5–15)
BUN: 17 mg/dL (ref 6–23)
CHLORIDE: 101 meq/L (ref 96–112)
CO2: 29 mEq/L (ref 19–32)
Calcium: 9.1 mg/dL (ref 8.4–10.5)
Creatinine, Ser: 0.93 mg/dL (ref 0.50–1.10)
GFR calc Af Amer: 89 mL/min — ABNORMAL LOW (ref >90)
GFR, EST NON AFRICAN AMERICAN: 77 mL/min — AB (ref >90)
Glucose, Bld: 109 mg/dL — ABNORMAL HIGH (ref 70–99)
POTASSIUM: 4.6 meq/L (ref 3.7–5.3)
Sodium: 141 mEq/L (ref 137–147)

## 2014-04-27 LAB — URINE MICROSCOPIC-ADD ON

## 2014-04-27 LAB — CBC
HEMATOCRIT: 34 % — AB (ref 36.0–46.0)
HEMOGLOBIN: 10.6 g/dL — AB (ref 12.0–15.0)
MCH: 22.9 pg — ABNORMAL LOW (ref 26.0–34.0)
MCHC: 31.2 g/dL (ref 30.0–36.0)
MCV: 73.6 fL — ABNORMAL LOW (ref 78.0–100.0)
Platelets: 368 10*3/uL (ref 150–400)
RBC: 4.62 MIL/uL (ref 3.87–5.11)
RDW: 16.4 % — ABNORMAL HIGH (ref 11.5–15.5)
WBC: 11.5 10*3/uL — ABNORMAL HIGH (ref 4.0–10.5)

## 2014-04-27 LAB — MAGNESIUM: Magnesium: 2.1 mg/dL (ref 1.5–2.5)

## 2014-04-27 MED ORDER — MILK AND MOLASSES ENEMA
1.0000 | Freq: Once | RECTAL | Status: AC
  Administered 2014-04-27: 250 mL via RECTAL

## 2014-04-27 MED ORDER — SIMETHICONE 80 MG PO CHEW
80.0000 mg | CHEWABLE_TABLET | Freq: Four times a day (QID) | ORAL | Status: DC | PRN
  Administered 2014-04-27 – 2014-04-30 (×3): 80 mg via ORAL
  Filled 2014-04-27 (×3): qty 1

## 2014-04-27 MED ORDER — MORPHINE SULFATE 2 MG/ML IJ SOLN
2.0000 mg | INTRAMUSCULAR | Status: DC | PRN
  Administered 2014-04-27 – 2014-04-28 (×3): 2 mg via INTRAVENOUS
  Filled 2014-04-27 (×3): qty 1

## 2014-04-27 MED ORDER — MORPHINE SULFATE 2 MG/ML IJ SOLN
2.0000 mg | Freq: Once | INTRAMUSCULAR | Status: AC
  Administered 2014-04-27: 2 mg via INTRAVENOUS
  Filled 2014-04-27: qty 1

## 2014-04-27 NOTE — Progress Notes (Addendum)
PROGRESS NOTE  Chelsey Santiago GEX:[id number] DOB: [birth year] DOA: 04/22/2014 PCP: No PCP Per Patient Primary cardiologist: Dr Carlyle Dolly MD  Addendum 1700 Developed acute LLQ pain, no alleviating factors, radiating to left flank. Minimal pain with urination. No h/o kidney stones. Reports 2 small BM last 24 hours (not recorded). No right-sided pain, no suprapubic pain.  Exam appears non-toxic but obvious pain, tender left-lower quadrant without rebound or guarding. No left CVA tenderness. No right-sided tenderness. Suprapubic incision clean, dry, healing very well; non-tender, no exudate.  AXR with sig stool right side, nonspecific gas left side.   Suspect constipation/gas as pain. Hemodynamics stable, no fever. Minimal leukocytosis of unclear significant. Plan check U/A. Try enema. Consider further imaging if fails to improve.  Summary: 38 year old woman with history of difficult to control hypertension, questioned compliance, who underwent induction of labor November 2015 for preeclampsia, status post C-section 11/24, discharged 11/30. Referred to the emergency department from OB/GYN office 10/74 history of progressive lower extremity edema and shortness of breath. She was admitted for acute systolic congestive heart failure, cardiology consultation.  Assessment/Plan: 1. Acute on chronic systolic congestive heart failure. 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 Also consider hypertensive cardiomyopathy. Excellent diuresis. Clinically improving. 2. Status post accelerated hypertension/hypertensive urgency/resistant hypertension.  3. Resistant hypertension. Overall improved. Continue Norvasc, carvedilol, lisinopril, hydralazine per cardiology. Outpatient workup for secondary causes of hypertension recommended. TSH normal, cortisol normal; renin/aldosterone levels pending. Consideration will be given to renal artery ultrasound as an outpatient.  4. Diabetes mellitus, capillary blood sugars are  stable. 5. Chronic microcytic anemia stable, follow-up as an outpatient. 6. S/p induction for severe preeclampsia ultimately with C-section 11/24. Not breast-feeding.   Overall improving with excellent diuresis, stable renal function, unremarkable telemetry.  Continue IV diuresis, still has significant volume overload.  Continue antihypertensives.  Possibly home in the next 48 hours.  Code Status: full code DVT prophylaxis: Lovenox Family Communication: none present Disposition Plan: home  Murray Hodgkins, MD  [facility]  Pager [phone] If 7PM-7AM, please contact night-coverage at [email or web address], password [facility] 04/27/2014, 7:32 AM  LOS: 5 days   Consultants:  Cardiology   Procedures:  2-D echocardiogram 04/08/2014: LVEF 35%. Severe hypokinesis of the inferior and inferolateral wall.   HPI/Subjective: Overall feeling better today. She can tell swelling has improved, less swelling in her thighs. Eating well. Minimal gas pain in her abdomen. No vomiting. Intermittent headache noted.  Objective: Filed Vitals:   04/26/14 0526 04/26/14 1516 04/26/14 2248 04/27/14 0542  BP: 155/86 138/86 149/84 153/84  Pulse: 88 89 85 93  Temp: 97.7 F (36.5 C) 98.6 F (37 C) 98.2 F (36.8 C) 98.5 F (36.9 C)  TempSrc: Oral Oral Oral Oral  Resp:  20 20 20   Height:      Weight: 114.352 kg (252 lb 1.6 oz)   114.352 kg (252 lb 1.6 oz)  SpO2: 100% 99% 98% 98%    Intake/Output Summary (Last 24 hours) at 04/27/14 0732 Last data filed at 04/27/14 0500  Gross per 24 hour  Intake    600 ml  Output   4425 ml  Net  -3825 ml     Filed Weights   04/25/14 0700 04/26/14 0526 04/27/14 0542  Weight: 119.704 kg (263 lb 14.4 oz) 114.352 kg (252 lb 1.6 oz) 114.352 kg (252 lb 1.6 oz)    Exam:     Afebrile, vital signs are stable. No hypoxia. Blood pressure stable.  Gen. Appears calm, comfortable. Speech fluent and clear.  Ambulates without difficulty around  the room.  Grossly  normal mentation. Speech fluent and clear.  Respiratory clear to auscultation bilaterally. No wheezes, rales or rhonchi. Normal respiratory effort.  Cardiovascular regular rate and rhythm. No murmur, rub or gallop. Decreasing lower extremity edema, still 3+ bilaterally. Much less edema in the thighs.  Data Reviewed:  Weight down approximately 1 kg.  Urine output 4.4 L. -17 L since admission.  Basic metabolic panel 79/89 unremarkable  Scheduled Meds: . amLODipine  10 mg Oral Daily  . aspirin  81 mg Oral Daily  . carvedilol  37.5 mg Oral BID WC  . enoxaparin (LOVENOX) injection  40 mg Subcutaneous Q24H  . furosemide  20 mg Intravenous BID  . hydrALAZINE  100 mg Oral 3 times per day  . insulin aspart  0-15 Units Subcutaneous TID WC  . insulin aspart  0-5 Units Subcutaneous QHS  . lisinopril  40 mg Oral Daily  . pantoprazole  40 mg Oral Daily  . polyethylene glycol  17 g Oral BID  . potassium chloride SA  40 mEq Oral Daily  . sodium chloride  3 mL Intravenous Q12H   Continuous Infusions:   Principal Problem:   Acute on chronic systolic CHF (congestive heart failure) Active Problems:   Microcytic anemia   Pulmonary edema   Hypertensive urgency   Acute CHF   Resistant hypertension   Essential hypertension   Time spent 20 minutes

## 2014-04-27 NOTE — Progress Notes (Signed)
Patient's blood pressure 166/105.  Notified MD.  Will continue to monitor patient.

## 2014-04-28 DIAGNOSIS — E119 Type 2 diabetes mellitus without complications: Secondary | ICD-10-CM

## 2014-04-28 DIAGNOSIS — I5021 Acute systolic (congestive) heart failure: Secondary | ICD-10-CM

## 2014-04-28 LAB — BASIC METABOLIC PANEL
ANION GAP: 12 (ref 5–15)
BUN: 15 mg/dL (ref 6–23)
CO2: 29 meq/L (ref 19–32)
Calcium: 9.1 mg/dL (ref 8.4–10.5)
Chloride: 98 mEq/L (ref 96–112)
Creatinine, Ser: 0.97 mg/dL (ref 0.50–1.10)
GFR calc Af Amer: 85 mL/min — ABNORMAL LOW (ref >90)
GFR calc non Af Amer: 73 mL/min — ABNORMAL LOW (ref >90)
Glucose, Bld: 108 mg/dL — ABNORMAL HIGH (ref 70–99)
Potassium: 4.1 mEq/L (ref 3.7–5.3)
SODIUM: 139 meq/L (ref 137–147)

## 2014-04-28 LAB — CBC
HCT: 32.8 % — ABNORMAL LOW (ref 36.0–46.0)
Hemoglobin: 10.1 g/dL — ABNORMAL LOW (ref 12.0–15.0)
MCH: 22.6 pg — AB (ref 26.0–34.0)
MCHC: 30.8 g/dL (ref 30.0–36.0)
MCV: 73.4 fL — AB (ref 78.0–100.0)
PLATELETS: 363 10*3/uL (ref 150–400)
RBC: 4.47 MIL/uL (ref 3.87–5.11)
RDW: 16.4 % — AB (ref 11.5–15.5)
WBC: 11 10*3/uL — ABNORMAL HIGH (ref 4.0–10.5)

## 2014-04-28 LAB — GLUCOSE, CAPILLARY
Glucose-Capillary: 114 mg/dL — ABNORMAL HIGH (ref 70–99)
Glucose-Capillary: 133 mg/dL — ABNORMAL HIGH (ref 70–99)
Glucose-Capillary: 116 mg/dL — ABNORMAL HIGH (ref 70–99)
Glucose-Capillary: 124 mg/dL — ABNORMAL HIGH (ref 70–99)

## 2014-04-28 LAB — MAGNESIUM: MAGNESIUM: 2.2 mg/dL (ref 1.5–2.5)

## 2014-04-28 MED ORDER — MILK AND MOLASSES ENEMA
1.0000 | Freq: Once | RECTAL | Status: AC
  Administered 2014-04-28: 250 mL via RECTAL

## 2014-04-28 MED ORDER — OXYCODONE HCL 5 MG PO TABS
5.0000 mg | ORAL_TABLET | ORAL | Status: DC | PRN
  Administered 2014-04-29 (×3): 5 mg via ORAL
  Filled 2014-04-28 (×3): qty 1

## 2014-04-28 MED ORDER — SENNA 8.6 MG PO TABS
1.0000 | ORAL_TABLET | Freq: Every day | ORAL | Status: DC
  Administered 2014-04-28: 8.6 mg via ORAL
  Filled 2014-04-28: qty 1

## 2014-04-28 NOTE — Progress Notes (Addendum)
PROGRESS NOTE  Chelsey Santiago SWN:[id number] DOB: 05-10-1976 DOA: 04/22/2014 PCP: No PCP Per Patient Primary cardiologist: Dr Carlyle Dolly MD  Summary: 38 year old woman with history of difficult to control hypertension, questioned compliance, who underwent induction of labor November 2015 for preeclampsia, status post C-section 11/24, discharged 11/30. Referred to the emergency department from OB/GYN office 10/74 history of progressive lower extremity edema and shortness of breath. She was admitted for acute systolic congestive heart failure, cardiology consultation. She has responded very well to Lasix with excellent diuresis but still has significant volume overload. Plan to continue IV diuresis until closer to dry weight.  Assessment/Plan: 1. Acute on chronic systolic congestive heart failure. 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 Also consider hypertensive cardiomyopathy. Continues to improve with excellent diuresis and weight loss. Asymptomatic. 2. Abdominal pain. Nearly resolved at this point with adenoma and evacuation of gas. Monitor clinically. Repeat enema today. 3. Status post accelerated hypertension/hypertensive urgency/resistant hypertension. Resolved. 4. Resistant hypertension. Better control. Continue Norvasc, carvedilol, lisinopril, hydralazine per cardiology. Outpatient workup for secondary causes of hypertension recommended. TSH normal, cortisol normal; renin/aldosterone levels pending. Consideration will be given to renal artery ultrasound as an outpatient.  5. Diabetes mellitus, capillary blood sugars remained stable. 6. Chronic microcytic anemia stable, follow-up as an outpatient. 7. S/p induction for severe preeclampsia ultimately with C-section 11/24. Not breast-feeding.   Overall improving with excellent diuresis. Plan to continue IV Lasix today. May be able to transition to oral therapy in the next 48 hours. Still is evidence of significant volume overload.  Check  basic metabolic panel in the morning.  Code Status: full code DVT prophylaxis: Lovenox Family Communication: none present Disposition Plan: home  Murray Hodgkins, MD  [facility]  Pager [phone] If 7PM-7AM, please contact night-coverage at [email or web address], password [email or web address] 04/28/2014, 4:31 PM  LOS: 6 days   Consultants:  Cardiology   Procedures:  2-D echocardiogram 04/08/2014: LVEF 35%. Severe hypokinesis of the inferior and inferolateral wall.   HPI/Subjective: Large bowel movement last night, passed quite a bit of gas. She feels a lot better today. She would like to have another enema. She would like to eat. Very minimal left-sided abdominal pain with resolving gas.  Objective: Filed Vitals:   04/27/14 1452 04/27/14 2156 04/28/14 0635 04/28/14 1400  BP: 166/105 144/89 158/97 132/76  Pulse: 96 86 85 87  Temp: 97.8 F (36.6 C) 99.3 F (37.4 C)  97.9 F (36.6 C)  TempSrc: Oral Oral Oral Oral  Resp:  18 19 20   Height:      Weight:   108.909 kg (240 lb 1.6 oz)   SpO2: 98% 98% 98% 99%    Intake/Output Summary (Last 24 hours) at 04/28/14 1631 Last data filed at 04/28/14 1500  Gross per 24 hour  Intake    480 ml  Output   1700 ml  Net  -1220 ml     Filed Weights   04/27/14 0542 04/27/14 0700 04/28/14 0635  Weight: 114.352 kg (252 lb 1.6 oz) 113.762 kg (250 lb 12.8 oz) 108.909 kg (240 lb 1.6 oz)    Exam:     Afebrile, vital signs are stable. No hypoxia.  Appears calm, comfortable. Appears much better today. Speech fluent and clear.  Respiratory clear to auscultation bilaterally. Normal respiratory effort. No wheezes, rales or rhonchi. Normal respiratory effort.  Cardiovascular regular rate and rhythm. No murmur, rub or gallop. 3+ bilateral lower extremity edema, slowly improving.  Abdomen soft, there is mild tenderness in the left side.  Data Reviewed:  Weight down approximately 10 pounds over yesterday. Question accuracy.   Capillary blood sugars are  stable.  Urine output 800 mL +2 voids. -18.6 L since admission.  Basic metabolic panel 22/97 unremarkable. Potassium normal.  Scheduled Meds: . amLODipine  10 mg Oral Daily  . aspirin  81 mg Oral Daily  . carvedilol  37.5 mg Oral BID WC  . enoxaparin (LOVENOX) injection  40 mg Subcutaneous Q24H  . furosemide  20 mg Intravenous BID  . hydrALAZINE  100 mg Oral 3 times per day  . insulin aspart  0-15 Units Subcutaneous TID WC  . insulin aspart  0-5 Units Subcutaneous QHS  . lisinopril  40 mg Oral Daily  . milk and molasses  1 enema Rectal Once  . pantoprazole  40 mg Oral Daily  . polyethylene glycol  17 g Oral BID  . potassium chloride SA  40 mEq Oral Daily  . senna  1 tablet Oral QHS  . sodium chloride  3 mL Intravenous Q12H   Continuous Infusions:   Principal Problem:   Acute systolic CHF (congestive heart failure) Active Problems:   Microcytic anemia   Pulmonary edema   Hypertensive urgency   Resistant hypertension   Essential hypertension   Time spent 15 minutes

## 2014-04-29 DIAGNOSIS — O903 Peripartum cardiomyopathy: Secondary | ICD-10-CM

## 2014-04-29 DIAGNOSIS — G4733 Obstructive sleep apnea (adult) (pediatric): Secondary | ICD-10-CM

## 2014-04-29 DIAGNOSIS — Z9119 Patient's noncompliance with other medical treatment and regimen: Secondary | ICD-10-CM

## 2014-04-29 DIAGNOSIS — I110 Hypertensive heart disease with heart failure: Secondary | ICD-10-CM

## 2014-04-29 DIAGNOSIS — R1032 Left lower quadrant pain: Secondary | ICD-10-CM | POA: Insufficient documentation

## 2014-04-29 LAB — GLUCOSE, CAPILLARY
GLUCOSE-CAPILLARY: 150 mg/dL — AB (ref 70–99)
GLUCOSE-CAPILLARY: 99 mg/dL (ref 70–99)
Glucose-Capillary: 149 mg/dL — ABNORMAL HIGH (ref 70–99)
Glucose-Capillary: 164 mg/dL — ABNORMAL HIGH (ref 70–99)

## 2014-04-29 LAB — URINALYSIS, ROUTINE W REFLEX MICROSCOPIC
BILIRUBIN URINE: NEGATIVE
Glucose, UA: NEGATIVE mg/dL
Ketones, ur: NEGATIVE mg/dL
NITRITE: NEGATIVE
PH: 8 (ref 5.0–8.0)
Protein, ur: 100 mg/dL — AB
SPECIFIC GRAVITY, URINE: 1.02 (ref 1.005–1.030)
Urobilinogen, UA: 0.2 mg/dL (ref 0.0–1.0)

## 2014-04-29 LAB — BASIC METABOLIC PANEL
Anion gap: 11 (ref 5–15)
BUN: 18 mg/dL (ref 6–23)
CO2: 28 meq/L (ref 19–32)
CREATININE: 0.99 mg/dL (ref 0.50–1.10)
Calcium: 8.9 mg/dL (ref 8.4–10.5)
Chloride: 102 mEq/L (ref 96–112)
GFR calc Af Amer: 83 mL/min — ABNORMAL LOW (ref >90)
GFR calc non Af Amer: 71 mL/min — ABNORMAL LOW (ref >90)
Glucose, Bld: 117 mg/dL — ABNORMAL HIGH (ref 70–99)
Potassium: 4 mEq/L (ref 3.7–5.3)
Sodium: 141 mEq/L (ref 137–147)

## 2014-04-29 LAB — CBC
HEMATOCRIT: 30.9 % — AB (ref 36.0–46.0)
Hemoglobin: 9.6 g/dL — ABNORMAL LOW (ref 12.0–15.0)
MCH: 22.7 pg — ABNORMAL LOW (ref 26.0–34.0)
MCHC: 31.1 g/dL (ref 30.0–36.0)
MCV: 73.2 fL — ABNORMAL LOW (ref 78.0–100.0)
PLATELETS: 342 10*3/uL (ref 150–400)
RBC: 4.22 MIL/uL (ref 3.87–5.11)
RDW: 16.3 % — ABNORMAL HIGH (ref 11.5–15.5)
WBC: 8.4 10*3/uL (ref 4.0–10.5)

## 2014-04-29 LAB — URINE MICROSCOPIC-ADD ON

## 2014-04-29 LAB — MAGNESIUM: Magnesium: 2.2 mg/dL (ref 1.5–2.5)

## 2014-04-29 MED ORDER — SPIRONOLACTONE 25 MG PO TABS
12.5000 mg | ORAL_TABLET | Freq: Two times a day (BID) | ORAL | Status: DC
  Filled 2014-04-29: qty 1

## 2014-04-29 MED ORDER — SENNA 8.6 MG PO TABS
2.0000 | ORAL_TABLET | Freq: Every day | ORAL | Status: DC
  Administered 2014-04-29: 17.2 mg via ORAL
  Filled 2014-04-29: qty 2

## 2014-04-29 MED ORDER — SPIRONOLACTONE 25 MG PO TABS
25.0000 mg | ORAL_TABLET | Freq: Every day | ORAL | Status: DC
  Administered 2014-04-29 – 2014-04-30 (×2): 25 mg via ORAL
  Filled 2014-04-29 (×2): qty 1

## 2014-04-29 MED ORDER — BISACODYL 10 MG RE SUPP: 10.0000 mg | Freq: Every day | RECTAL | Status: DC | PRN

## 2014-04-29 MED ORDER — FLEET ENEMA 7-19 GM/118ML RE ENEM
1.0000 | ENEMA | Freq: Once | RECTAL | Status: AC
  Administered 2014-04-29: 1 via RECTAL

## 2014-04-29 NOTE — Progress Notes (Signed)
Consulting cardiologist: Kate Sable MD Primary Cardiologist: Carlyle Dolly MD  Cardiology Specific Problem List: 1. Post partem Acute on Chronic CHF 2. Hypertension   Subjective:    Complaining of abdominal pain and continued fluid retention in LE.  Objective:   Temp:  [97.9 F (36.6 C)-99.3 F (37.4 C)] 98.6 F (37 C) (12/14 0500) Pulse Rate:  [85-87] 85 (12/14 0500) Resp:  [20] 20 (12/14 0500) BP: (132-178)/(75-102) 178/102 mmHg (12/14 0500) SpO2:  [96 %-100 %] 100 % (12/14 0500) Weight:  [238 lb 8 oz (108.183 kg)] 238 lb 8 oz (108.183 kg) (12/14 0500) Last BM Date: 04/28/14  Filed Weights   04/27/14 0700 04/28/14 0635 04/29/14 0500  Weight: 250 lb 12.8 oz (113.762 kg) 240 lb 1.6 oz (108.909 kg) 238 lb 8 oz (108.183 kg)    Intake/Output Summary (Last 24 hours) at 04/29/14 1046 Last data filed at 04/29/14 0900  Gross per 24 hour  Intake   1280 ml  Output   1800 ml  Net   -520 ml    Telemetry: NSR  Exam:  General: No acute distress.  HEENT: Conjunctiva and lids normal, oropharynx clear.  Lungs: Clear to auscultation, nonlabored.  Cardiac: No elevated JVP or bruits. RRR,, no gallop or rub.   Abdomen:ABSENT bowel sounds, nontender, nondistended.  Extremities: 2+ pitting pre-tibial edema, distal pulses full.  Neuropsychiatric: Alert and oriented x3, affect appropriate.   Lab Results:  Basic Metabolic Panel:  Recent Labs Lab 04/27/14 0624 04/28/14 0555 04/29/14 0603  NA 141 139 141  K 4.6 4.1 4.0  CL 101 98 102  CO2 29 29 28   GLUCOSE 109* 108* 117*  BUN 17 15 18   CREATININE 0.93 0.97 0.99  CALCIUM 9.1 9.1 8.9  MG 2.1 2.2 2.2    Liver Function Tests:  Recent Labs Lab 04/22/14 1624  AST 23  ALT 24  ALKPHOS 102  BILITOT 0.2*  PROT 6.2  ALBUMIN 2.3*    CBC:  Recent Labs Lab 04/27/14 1619 04/28/14 0553 04/29/14 0603  WBC 11.5* 11.0* 8.4  HGB 10.6* 10.1* 9.6*  HCT 34.0* 32.8* 30.9*  MCV 73.6* 73.4* 73.2*  PLT  368 363 342    Cardiac Enzymes:  Recent Labs Lab 04/22/14 2110 04/23/14 0256 04/23/14 0855  TROPONINI <0.30 <0.30 <0.30   Echocardiogram 04/08/2014 Left ventricle: Severe hypokinesis of the inferior wall and the inferolateral wall. Hypokinesis of the other walls. EF 35% The cavity size was normal. Wall thickness was increased in a pattern of mild LVH. - Mitral valve: There was mild regurgitation. - Left atrium: The atrium was mildly dilated. - Right ventricle: The cavity size was normal. Systolic function was normal.  BNP:  Recent Labs  04/08/14 1545 04/22/14 1624  PROBNP 1854.0* 2380.0*   Radiology: Dg Abd 2 Views  04/27/2014   CLINICAL DATA:  Left lower quadrant pain, recent C-section  EXAM: ABDOMEN - 2 VIEW  COMPARISON:  None.  FINDINGS: Scattered large and small bowel gas is noted. Fecal material is noted within the colon. No free air is seen. No abnormal mass or abnormal calcifications are noted. No acute bony abnormality is seen.  IMPRESSION: No acute abnormality noted.   Electronically Signed   By: Inez Catalina M.D.   On: 04/27/2014 16:43    Medications:   Scheduled Medications: . amLODipine  10 mg Oral Daily  . aspirin  81 mg Oral Daily  . carvedilol  37.5 mg Oral BID WC  . enoxaparin (LOVENOX) injection  40 mg Subcutaneous Q24H  . furosemide  20 mg Intravenous BID  . hydrALAZINE  100 mg Oral 3 times per day  . insulin aspart  0-15 Units Subcutaneous TID WC  . insulin aspart  0-5 Units Subcutaneous QHS  . lisinopril  40 mg Oral Daily  . pantoprazole  40 mg Oral Daily  . polyethylene glycol  17 g Oral BID  . potassium chloride SA  40 mEq Oral Daily  . senna  2 tablet Oral QHS  . sodium chloride  3 mL Intravenous Q12H    Infusions:    PRN Medications: sodium chloride, acetaminophen, bisacodyl, LORazepam, morphine injection, ondansetron (ZOFRAN) IV, oxyCODONE, promethazine, simethicone, sodium chloride   Assessment and Plan:   1. Acute on  Chronic Post Partem CHF: Has diuresed 19 liters since admission. Echo in [place] EF of 35%, with LVH. She continues on lasix 20 mg IV BID, carvedilol 37.5 mg BID, consider adding spironolactone with creatinine 0.99. Consider Life Vest in the setting of decreased EF. Transition to po in am. Torsemide for better bioavailability.   2. Hypertension: Remains elevated. She is on amlodipine as well. Does not tolerate nitrates.  Consider Enteresto for better control in the setting of systolic dysfunction and take off ACE  On hydralazine as well. She has been elevated in the am. Dr.Branch mentioned sleep study with hx of OSA in the past not on CPAP.  3. Abdominal Pain: I am not hearing bowel sounds. Having RN check behind me.  She had enema yesterday but continues to have pain. Abdominal films normal yesterday. Requests something for gas. Will order simethicone for symptomatic relief.     Phill Myron. Kincaid Tiger NP [place]  04/29/2014, 10:46 AM

## 2014-04-29 NOTE — Progress Notes (Signed)
PROGRESS NOTE  Chelsey Santiago BWG:[id number] DOB: 1975-10-23 DOA: 04/22/2014 PCP: No PCP Per Patient Primary cardiologist: Dr Carlyle Dolly MD  Summary: 38 year old woman with history of difficult to control hypertension, questioned compliance, who underwent induction of labor November 2015 for preeclampsia, status post C-section 11/24, discharged 11/30. Referred to the emergency department from OB/GYN office 10/74 history of progressive lower extremity edema and shortness of breath. She was admitted for acute systolic congestive heart failure, cardiology consultation. She has responded very well to Lasix with excellent diuresis but still has significant volume overload. Plan to continue IV diuresis until closer to dry weight.  Assessment/Plan: 1. Acute on chronic systolic congestive heart failure. 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 Also consider hypertensive cardiomyopathy. Continues to improve with excellent diuresis and weight loss. Continue with IV lasix. 2. Abdominal pain. Nearly resolved at this point with adenoma and evacuation of gas. Monitor clinically. Enema resulted in bowel movement.Abdomen xray showed no acute abnormality. 3. Status post accelerated hypertension/hypertensive urgency/resistant hypertension. Resolved. 4. Resistant hypertension. Better control. Continue Norvasc, carvedilol, lisinopril, hydralazine per cardiology. Outpatient workup for secondary causes of hypertension recommended. TSH normal, cortisol normal; renin/aldosterone levels pending. Consideration will be given to renal artery ultrasound as an outpatient.  5. Diabetes mellitus, capillary blood sugars remained stable. 6. Chronic microcytic anemia stable, follow-up as an outpatient. 7. S/p induction for severe preeclampsia ultimately with C-section 11/24. Not breast-feeding.  Code Status: full code DVT prophylaxis: Lovenox Family Communication: none present Disposition Plan: home  [facility]  Pager [phone] If 7PM-7AM, please contact night-coverage at [email or web address], password [facility] 04/29/2014, 2:04 PM  LOS: 7 days   Consultants:  Cardiology   Procedures:  2-D echocardiogram 04/08/2014: LVEF 35%. Severe hypokinesis of the inferior and inferolateral wall.   HPI/Subjective: Patient seen and examined, diuresing well with IV lasix.  Objective: Filed Vitals:   04/28/14 0635 04/28/14 1400 04/28/14 2140 04/29/14 0500  BP: 158/97 132/76 135/75 178/102  Pulse: 85 87 85 85  Temp:  97.9 F (36.6 C) 99.3 F (37.4 C) 98.6 F (37 C)  TempSrc: Oral Oral Oral Oral  Resp: 19 20 20 20   Height:      Weight: 108.909 kg (240 lb 1.6 oz)   108.183 kg (238 lb 8 oz)  SpO2: 98% 99% 96% 100%    Intake/Output Summary (Last 24 hours) at 04/29/14 1404 Last data filed at 04/29/14 0900  Gross per 24 hour  Intake   1040 ml  Output   1500 ml  Net   -460 ml     Filed Weights   04/27/14 0700 04/28/14 0635 04/29/14 0500  Weight: 113.762 kg (250 lb 12.8 oz) 108.909 kg (240 lb 1.6 oz) 108.183 kg (238 lb 8 oz)    Exam:    Physical Exam: Eyes: No icterus, extraocular muscles intact  Lungs: Normal respiratory effort, bilateral clear to auscultation, no crackles or wheezes.  Heart: Regular rate and rhythm, S1 and S2 normal, no murmurs, rubs auscultated Abdomen: BS normoactive,soft,nondistended,non-tender to palpation,no organomegaly Extremities: bilateral 2 + edema of lower extremities, no erythema, no cyanosis, no clubbing Neuro : Alert and oriented to time, place and person, No focal deficits Skin: No rashes seen on exam    Scheduled Meds: . amLODipine  10 mg Oral Daily  . aspirin  81 mg Oral Daily  . carvedilol  37.5 mg Oral BID WC  . enoxaparin (LOVENOX) injection  40 mg Subcutaneous Q24H  . furosemide  20 mg Intravenous BID  . hydrALAZINE  100 mg Oral 3 times per day  . insulin aspart  0-15 Units Subcutaneous TID WC  . insulin aspart  0-5 Units Subcutaneous QHS  .  lisinopril  40 mg Oral Daily  . pantoprazole  40 mg Oral Daily  . polyethylene glycol  17 g Oral BID  . potassium chloride SA  40 mEq Oral Daily  . senna  2 tablet Oral QHS  . sodium chloride  3 mL Intravenous Q12H  . spironolactone  25 mg Oral Daily   Continuous Infusions:   Principal Problem:   Acute systolic CHF (congestive heart failure) Active Problems:   Microcytic anemia   Pulmonary edema   Hypertensive urgency   Resistant hypertension   Essential hypertension   DM type 2 (diabetes mellitus, type 2)   Time spent 25 minutes

## 2014-04-29 NOTE — Evaluation (Signed)
Physical Therapy Evaluation Patient Details Name: Chelsey Santiago MRN: [id number] DOB: [birth year] Today's Date: 04/29/2014   History of Present Illness  HPI: Chelsey Santiago is a 38 y.o. female who recently delivered her baby by cesarean section on 11/24. She reports that she was released from the hospital on 11/30. Her hospital course at that time was complicated by preeclampsia issues. She presents to the emergency room today with worsening lower extremity edema. She reports that she had pedal edema towards the latter part of her pregnancy, but this has progressively gotten worse, extended up her legs and into her abdomen. She is now started to feel short of breath, mostly on exertion. She has not really had any chest pain. She was noted to be severely hypertensive in the emergency room with a blood pressure of 182/119. She reports having a headache for the last several days. She denies any changes in vision. She reports that she chronically has high blood pressure since her previous pregnancy in 2009 and has been on antihypertensives since that time. Workup in the emergency room indicated a chest x-ray with possible developing interstitial edema. Since she was significantly volume overloaded and severely hypertensive, she is being admitted for further treatments.  Clinical Impression   Pt was seen for evaluation.  She reports feeling much better.  She has no dyspnea and states that she has lost about 50# with treatment.  She is normally totally independent with all functional activities and is back to that status.  No further PT is needed.    Follow Up Recommendations No PT follow up    Equipment Recommendations  None recommended by PT    Recommendations for Other Services    none   Precautions / Restrictions Precautions Precautions: None Restrictions Weight Bearing Restrictions: No      Mobility  Bed Mobility Overal bed mobility: Independent                 Transfers Overall transfer level: Independent                  Ambulation/Gait Ambulation/Gait assistance: Independent Ambulation Distance (Feet): 400 Feet Assistive device: None Gait Pattern/deviations: WFL(Within Functional Limits)   Gait velocity interpretation: at or above normal speed for age/gender    Stairs            Wheelchair Mobility    Modified Rankin (Stroke Patients Only)       Balance Overall balance assessment: Independent                                           Pertinent Vitals/Pain Pain Assessment: No/denies pain    Home Living Family/patient expects to be discharged to:: Private residence Living Arrangements: Children Available Help at Discharge: Family;Available 24 hours/day Type of Home: House Home Access: Level entry     Home Layout: One level Home Equipment: None      Prior Function Level of Independence: Independent               Hand Dominance        Extremity/Trunk Assessment   Upper Extremity Assessment: Overall WFL for tasks assessed           Lower Extremity Assessment: Overall WFL for tasks assessed         Communication   Communication: No difficulties  Cognition Arousal/Alertness: Awake/alert Behavior During Therapy: WFL for  tasks assessed/performed Overall Cognitive Status: Within Functional Limits for tasks assessed                      General Comments      Exercises        Assessment/Plan    PT Assessment Patent does not need any further PT services  PT Diagnosis     PT Problem List    PT Treatment Interventions     PT Goals (Current goals can be found in the Care Plan section) Acute Rehab PT Goals PT Goal Formulation: All assessment and education complete, DC therapy    Frequency     Barriers to discharge  none      Co-evaluation               End of Session   Activity Tolerance: Patient tolerated treatment well Patient left:  in bed;with call bell/phone within reach           Time: 0845-0909 PT Time Calculation (min) (ACUTE ONLY): 24 min   Charges:   PT Evaluation $Initial PT Evaluation Tier I: 1 Procedure     PT G CodesDemetrios Isaacs L 04/29/2014, 10:44 AM

## 2014-04-30 DIAGNOSIS — I519 Heart disease, unspecified: Secondary | ICD-10-CM

## 2014-04-30 DIAGNOSIS — I429 Cardiomyopathy, unspecified: Secondary | ICD-10-CM

## 2014-04-30 LAB — MAGNESIUM: Magnesium: 2.3 mg/dL (ref 1.5–2.5)

## 2014-04-30 LAB — URINE CULTURE: Colony Count: 70000

## 2014-04-30 LAB — GLUCOSE, CAPILLARY: GLUCOSE-CAPILLARY: 118 mg/dL — AB (ref 70–99)

## 2014-04-30 MED ORDER — SENNA 8.6 MG PO TABS: 2.0000 | ORAL_TABLET | Freq: Every day | ORAL | Status: DC | PRN

## 2014-04-30 MED ORDER — TORSEMIDE 20 MG PO TABS: 20.0000 mg | ORAL_TABLET | Freq: Every day | ORAL | Status: DC

## 2014-04-30 MED ORDER — SPIRONOLACTONE 25 MG PO TABS: 25.0000 mg | ORAL_TABLET | Freq: Every day | ORAL | Status: DC

## 2014-04-30 MED ORDER — CARVEDILOL 12.5 MG PO TABS: 37.5000 mg | ORAL_TABLET | Freq: Two times a day (BID) | ORAL | Status: DC

## 2014-04-30 MED ORDER — OXYCODONE-ACETAMINOPHEN 5-325 MG PO TABS: 1.0000 | ORAL_TABLET | ORAL | Status: DC | PRN

## 2014-04-30 MED ORDER — LISINOPRIL 20 MG PO TABS: 40.0000 mg | ORAL_TABLET | Freq: Every day | ORAL | Status: DC

## 2014-04-30 MED ORDER — HYDRALAZINE HCL 100 MG PO TABS: 100.0000 mg | ORAL_TABLET | Freq: Three times a day (TID) | ORAL | Status: DC

## 2014-04-30 MED ORDER — TORSEMIDE 20 MG PO TABS
40.0000 mg | ORAL_TABLET | Freq: Every day | ORAL | Status: DC
Start: 2014-04-30 — End: 2014-10-30

## 2014-04-30 MED ORDER — TORSEMIDE 20 MG PO TABS
40.0000 mg | ORAL_TABLET | Freq: Every day | ORAL | Status: DC
  Administered 2014-04-30: 40 mg via ORAL
  Filled 2014-04-30: qty 2

## 2014-04-30 MED ORDER — PROMETHAZINE HCL 12.5 MG PO TABS: 12.5000 mg | ORAL_TABLET | Freq: Four times a day (QID) | ORAL | Status: DC | PRN

## 2014-04-30 MED ORDER — POLYETHYLENE GLYCOL 3350 17 G PO PACK: 17.0000 g | PACK | Freq: Two times a day (BID) | ORAL | Status: DC

## 2014-04-30 NOTE — Progress Notes (Signed)
Discharge instructions and prescriptions given, verbalized understanding, out in stable condition via w/c with staff. 

## 2014-04-30 NOTE — Discharge Summary (Signed)
Physician Discharge Summary  Chelsey Santiago DDU:[id number] DOB: 08/24/1975 DOA: 04/22/2014  PCP: No PCP Per Patient  Admit date: 04/22/2014 Discharge date: 04/30/2014  Time spent: 50* minutes  Recommendations for Outpatient Follow-up:  1. *Follow up cardiology in 2 weeks   Discharge Diagnoses:  Principal Problem:   Acute systolic CHF (congestive heart failure) Active Problems:   Microcytic anemia   Pulmonary edema   Hypertensive urgency   Resistant hypertension   Essential hypertension   DM type 2 (diabetes mellitus, type 2)   LLQ pain   Discharge Condition: Stable  Diet recommendation: *Low salt diet  Filed Weights   04/28/14 0635 04/29/14 0500 04/30/14 0615  Weight: 108.909 kg (240 lb 1.6 oz) 108.183 kg (238 lb 8 oz) 105.96 kg (233 lb 9.6 oz)    History of present illness:  38 year old woman with history of difficult to control hypertension, questioned compliance, who underwent induction of labor November 2015 for preeclampsia, status post C-section 11/24, discharged 11/30. Referred to the emergency department from OB/GYN office 10/74 history of progressive lower extremity edema and shortness of breath. She was admitted for acute systolic congestive heart failure, cardiology consultation. She has responded very well to Lasix with excellent diuresis but still has significant volume overload. Plan to continue IV diuresis until closer to dry weight  Hospital Course:  1. Acute on chronic systolic congestive heart failure. 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 Continued to improve with excellent diuresis and weight loss. IV lasix was changed to [place]amide per cardiology. They will follow the patient on dec 29 2. Abdominal pain. Nearly resolved at this point with enema and evacuation of gas. Monitor clinically. Enema resulted in bowel movement.Abdomen xray showed no acute abnormality.  3. Constipation- Resolved with daily Miralax and enema. Will  send her home on senakot  Prn with daily miralax. 4. Status post accelerated hypertension/hypertensive urgency/resistant hypertension. Resolved. 5. Resistant hypertension. Better control. Continue Norvasc, carvedilol, lisinopril, hydralazine per cardiology. Outpatient workup for secondary causes of hypertension recommended. TSH normal, cortisol normal; renin/aldosterone levels pending. Consideration will be given to renal artery ultrasound as an outpatient.  6. Diabetes mellitus, capillary blood sugars remained stable.Not on any meds at home. 7. Chronic microcytic anemia stable, follow-up as an outpatient. 8. S/p induction for severe preeclampsia ultimately with C-section 11/24. Not breast-feeding. 9. Anemia- Likely due to postpartum bleed, hb today is 9.6. will follow up Ob/GYn. May need Iron replacement.  Procedures:  *None  Consultations:  Cardiology  Discharge Exam: Filed Vitals:   04/30/14 0615  BP: 144/88  Pulse: 86  Temp: 97.7 F (36.5 C)  Resp: 20    General: Appear in no acute distress Cardiovascular: S1s2 RRR Respiratory: Clear bilaterally  Discharge Instructions You were cared for by a hospitalist during your hospital stay. If you have any questions about your discharge medications or the care you received while you were in the hospital after you are discharged, you can call the unit and asked to speak with the hospitalist on call if the hospitalist that took care of you is not available. Once you are discharged, your primary care physician will handle any further medical issues. Please note that NO REFILLS for any discharge medications will be authorized once you are discharged, as it is imperative that you return to your primary care physician (or establish a relationship with a primary care physician if you do not have one) for your aftercare needs so that they can reassess your need for medications and monitor  your lab values.  Discharge Instructions    Diet - low  sodium heart healthy    Complete by:  As directed      Discharge instructions    Complete by:  As directed   Will need BMP in one week to assess the kidney function     Increase activity slowly    Complete by:  As directed           Current Discharge Medication List    START taking these medications   Details  hydrALAZINE (APRESOLINE) 100 MG tablet Take 1 tablet (100 mg total) by mouth every 8 (eight) hours. Qty: 90 tablet, Refills: 2    polyethylene glycol (MIRALAX / GLYCOLAX) packet Take 17 g by mouth 2 (two) times daily. Qty: 14 each, Refills: 0    senna (SENOKOT) 8.6 MG TABS tablet Take 2 tablets (17.2 mg total) by mouth daily as needed for mild constipation. Qty: 30 each, Refills: 0    spironolactone (ALDACTONE) 25 MG tablet Take 1 tablet (25 mg total) by mouth daily. Qty: 30 tablet, Refills: 2    torsemide (DEMADEX) 20 MG tablet Take 2 tablets (40 mg total) by mouth daily. Qty: 60 tablet, Refills: 1      CONTINUE these medications which have CHANGED   Details  carvedilol (COREG) 12.5 MG tablet Take 3 tablets (37.5 mg total) by mouth 2 (two) times daily with a meal. Qty: 60 tablet, Refills: 2    lisinopril (PRINIVIL,ZESTRIL) 20 MG tablet Take 2 tablets (40 mg total) by mouth daily. Qty: 30 tablet, Refills: 2    oxyCODONE-acetaminophen (PERCOCET/ROXICET) 5-325 MG per tablet Take 1 tablet by mouth every 4 (four) hours as needed (for pain scale less than 7). Qty: 30 tablet, Refills: 0    promethazine (PHENERGAN) 12.5 MG tablet Take 1 tablet (12.5 mg total) by mouth every 6 (six) hours as needed for refractory nausea / vomiting. Qty: 30 tablet, Refills: 0      CONTINUE these medications which have NOT CHANGED   Details  amLODipine (NORVASC) 5 MG tablet Take 2 tablets (10 mg total) by mouth daily. Qty: 30 tablet, Refills: 0    aspirin 81 MG tablet Take 1 tablet (81 mg total) by mouth daily. Qty: 30 tablet, Refills: 6    butalbital-acetaminophen-caffeine (FIORICET)  50-325-40 MG per tablet Take 1-2 tablets by mouth every 6 (six) hours as needed for headache. Qty: 30 tablet, Refills: 1    omeprazole (PRILOSEC) 20 MG capsule Take 1 capsule (20 mg total) by mouth daily. Qty: 30 capsule, Refills: 2    potassium chloride SA (K-DUR,KLOR-CON) 20 MEQ tablet Take 2 tablets (40 mEq total) by mouth daily. Qty: 30 tablet, Refills: 2    venlafaxine XR (EFFEXOR-XR) 75 MG 24 hr capsule Take 1 capsule (75 mg total) by mouth daily. Qty: 30 capsule, Refills: 3      STOP taking these medications     furosemide (LASIX) 40 MG tablet      ibuprofen (ADVIL,MOTRIN) 600 MG tablet      labetalol (NORMODYNE) 200 MG tablet        Allergies  Allergen Reactions  . Diclofenac     Edema  . Tramadol Nausea And Vomiting  . Vicodin [Hydrocodone-Acetaminophen] Nausea Only   Follow-up Information    Follow up with Jory Sims, NP On 05/14/2014.   Specialty:  Nurse Practitioner   Why:  2:30 pm Post EPH   Contact information:   [address] [address]  [phone]        The results of significant diagnostics from this hospitalization (including imaging, microbiology, ancillary and laboratory) are listed below for reference.    Significant Diagnostic Studies: Dg Chest 2 View  04/22/2014   CLINICAL DATA:  Post partum and history of pre-eclampsia. Shortness of breath since delivery.  EXAM: CHEST  2 VIEW  COMPARISON:  04/18/2014 and 12/23/2011  FINDINGS: Again noted is enlargement of the cardiac silhouette. Prominent central vascular structures and cannot exclude congestion or mild edema. Again noted is a prominent azygos vein shadow. Again noted is mild blunting at the right costophrenic angle but no evidence for a large pleural effusion.  IMPRESSION: Cardiomegaly with concern for vascular congestion or mild edema. The congestion or mild edema has slightly progressed since 04/18/2014.   Electronically Signed   By: Markus Daft M.D.   On: 04/22/2014 15:53    Dg Chest 2 View  04/18/2014   CLINICAL DATA:  Delivered baby 9 days ago.  Leg pain and swelling.  EXAM: CHEST  2 VIEW  COMPARISON:  03/27/2014  FINDINGS: There is mild interstitial thickening. There is no focal parenchymal opacity, pleural effusion, or pneumothorax. There is stable cardiomegaly.  The osseous structures are unremarkable.  IMPRESSION: Cardiomegaly with bilateral interstitial thickening concerning for mild interstitial edema.   Electronically Signed   By: Kathreen Devoid   On: 04/18/2014 18:08   US Ob Follow Up  04/08/2014   FOLLOW UP SONOGRAM   MELLINA BENISON is in the office for a follow up sonogram for  BPP/AFI/UAD/EFW.  She is a 38 y.o. year old [id number] with Estimated Date of Delivery:  05/10/14 now at  [redacted]w[redacted]d weeks gestation. Thus far the pregnancy has been  complicated by AMA HTN fibroids.  GESTATION: SINGLETON  PRESENTATION: cephalic  FETAL ACTIVITY:          Heart rate         133 bpm          The fetus is active.  AMNIOTIC FLUID: The amniotic fluid volume is  normal, 9.7 cm SDP-5.5cm.  PLACENTA LOCALIZATION:  anterior (see notes) GRADE 2  CERVIX: Unable to view due to fetal position  Lower uterine fibroid= 8.7 x 8.3cm  ADNEXA: The  Adnexa appears  normal.   GESTATIONAL AGE AND  BIOMETRICS:  Gestational criteria: Estimated Date of Delivery: 05/10/14  now at [redacted]w[redacted]d  Previous Scans:  GESTATIONAL SAC            mm          weeks  CROWN RUMP LENGTH            mm          weeks  NUCHAL TRANSLUCENCY            mm           BIPARIETAL DIAMETER           8.7 cm         35+1 weeks  HEAD CIRCUMFERENCE           31.47 cm         35+2 weeks  ABDOMINAL CIRCUMFERENCE           31.99 cm         35+6 weeks  FEMUR LENGTH           6.83 cm         35+1 weeks  AVERAGE EGA(BY THIS SCAN):   35+2 weeks                                                 ESTIMATED FETAL WEIGHT:        2774  grams, 54 % ANATOMICAL SURVEY                                                                              COMMENTS CEREBRAL VENTRICLES yes normal   CHOROID PLEXUS yes normal   CEREBELLUM yes normal   CISTERNA MAGNA yes normal   NUCHAL REGION yes normal   ORBITS no    NASAL BONE no    NOSE/LIP no    FACIAL PROFILE no    4 CHAMBERED HEART no    OUTFLOW TRACTS no    DIAPHRAGM yes normal   STOMACH yes normal   RENAL REGION yes normal   BLADDER yes normal   CORD INSERTION no    3 VESSEL CORD yes normal   SPINE no    ARMS/HANDS no    LEGS/FEET no    GENITALIA   female         BIOPHYSCIAL PROFILE:                                                                                                       COMMENTS GROSS BODY MOVEMENT                 2   TONE                2   RESPIRATIONS                2   AMNIOTIC FLUID                2                                                            SCORE:  8/8 (Note: NST was not performed as part of this antepartum testing)   DOPPLER FLOW STUDIES: UMBILICAL ARTERY RI RATIOS:   0.67, 0.62    SUSPECTED ABNORMALITIES:  no  QUALITY OF SCAN: satisfactory  TECHNICIAN COMMENTS:  U/S(35+3wks)-vtx active fetus, BPP 8/8, fluid WNL AFI-9.7cm SDP-5.5cm, EFW  6 lb 2 oz (54th%tile), UA Doppler RI- 0.64 & 0.67, lower uterine  fibroid=8.7 x 8.3cm, anterior gr 2 placenta with thickened uterine wall  noted posterior  to placenta with ?fibroids noted within the wall (see  images/clips) uterine wall=4.6cm ??etiology?, FHR-133 bpm     A copy of this report including all images has been saved and backed up to  a second source for retrieval if needed. All measures and details of the  anatomical scan, placentation, fluid volume and pelvic anatomy are  contained in that report.  Lazarus Gowda 04/08/2014 9:17 AM       US Fetal Bpp W/o Non Stress  04/08/2014   FOLLOW UP SONOGRAM   LAKENYA RIENDEAU is in the office for a follow up sonogram for  BPP/AFI/UAD/EFW.  She is a 38 y.o. year old [id number] with Estimated Date of Delivery:  05/10/14 now at  [redacted]w[redacted]d weeks gestation.  Thus far the pregnancy has been  complicated by AMA HTN fibroids.  GESTATION: SINGLETON  PRESENTATION: cephalic  FETAL ACTIVITY:          Heart rate         133 bpm          The fetus is active.  AMNIOTIC FLUID: The amniotic fluid volume is  normal, 9.7 cm SDP-5.5cm.  PLACENTA LOCALIZATION:  anterior (see notes) GRADE 2  CERVIX: Unable to view due to fetal position  Lower uterine fibroid= 8.7 x 8.3cm  ADNEXA: The  Adnexa appears  normal.   GESTATIONAL AGE AND  BIOMETRICS:  Gestational criteria: Estimated Date of Delivery: 05/10/14  now at [redacted]w[redacted]d  Previous Scans:  GESTATIONAL SAC            mm          weeks  CROWN RUMP LENGTH            mm          weeks  NUCHAL TRANSLUCENCY            mm           BIPARIETAL DIAMETER           8.7 cm         35+1 weeks  HEAD CIRCUMFERENCE           31.47 cm         35+2 weeks  ABDOMINAL CIRCUMFERENCE           31.99 cm         35+6 weeks  FEMUR LENGTH           6.83 cm         35+1 weeks                                                           AVERAGE EGA(BY THIS SCAN):   35+2 weeks                                                 ESTIMATED FETAL WEIGHT:        2774  grams, 54 % ANATOMICAL SURVEY  COMMENTS CEREBRAL VENTRICLES yes normal   CHOROID PLEXUS yes normal   CEREBELLUM yes normal   CISTERNA MAGNA yes normal   NUCHAL REGION yes normal   ORBITS no    NASAL BONE no    NOSE/LIP no    FACIAL PROFILE no    4 CHAMBERED HEART no    OUTFLOW TRACTS no    DIAPHRAGM yes normal   STOMACH yes normal   RENAL REGION yes normal   BLADDER yes normal   CORD INSERTION no    3 VESSEL CORD yes normal   SPINE no    ARMS/HANDS no    LEGS/FEET no    GENITALIA   female         BIOPHYSCIAL PROFILE:                                                                                                       COMMENTS GROSS BODY MOVEMENT                 2   TONE                2   RESPIRATIONS                2   AMNIOTIC FLUID                 2                                                            SCORE:  8/8 (Note: NST was not performed as part of this antepartum testing)   DOPPLER FLOW STUDIES: UMBILICAL ARTERY RI RATIOS:   0.67, 0.62    SUSPECTED ABNORMALITIES:  no  QUALITY OF SCAN: satisfactory  TECHNICIAN COMMENTS:  U/S(35+3wks)-vtx active fetus, BPP 8/8, fluid WNL AFI-9.7cm SDP-5.5cm, EFW  6 lb 2 oz (54th%tile), UA Doppler RI- 0.64 & 0.67, lower uterine  fibroid=8.7 x 8.3cm, anterior gr 2 placenta with thickened uterine wall  noted posterior to placenta with ?fibroids noted within the wall (see  images/clips) uterine wall=4.6cm ??etiology?, FHR-133 bpm     A copy of this report including all images has been saved and backed up to  a second source for retrieval if needed. All measures and details of the  anatomical scan, placentation, fluid volume and pelvic anatomy are  contained in that report.  Lazarus Gowda 04/08/2014 9:17 AM       US Fetal Bpp W/o Non Stress  04/04/2014   FOLLOW UP SONOGRAM   SHAKELA DONATI is in the office for a follow up sonogram for  BPP/UAD/AFI.  She is a 38 y.o. year old [id number] with Estimated Date of Delivery:  05/10/14 now at  [redacted]w[redacted]d weeks gestation. Thus far the pregnancy has been  complicated by AMA HTN.  GESTATION: SINGLETON  PRESENTATION: cephalic  FETAL ACTIVITY:  Heart rate         166 bpm          The fetus is active.  AMNIOTIC FLUID: The amniotic fluid volume is  normal, 7.7 cm SDP- 3.2cm.  PLACENTA LOCALIZATION:   GRADE 2  CERVIX: N/A  ADNEXA: The adnexa appears normal.   GESTATIONAL AGE AND  BIOMETRICS:  Gestational criteria: Estimated Date of Delivery: 05/10/14  now at [redacted]w[redacted]d   BIOPHYSCIAL PROFILE:                                                                                                       [place]                 2   TONE                2   RESPIRATIONS                2   AMNIOTIC FLUID                2                                                             SCORE:  8/8 (Note: NST was not performed as part of this antepartum testing)   DOPPLER FLOW STUDIES: UMBILICAL ARTERY RI RATIOS:   0.57, 0.62    SUSPECTED ABNORMALITIES:  no  QUALITY OF SCAN: satisfactory  TECHNICIAN COMMENTS:  U/S(34+6wks)-vtx (fetal head slightly on Pt rt), active fetus, BPP 8/8,  fluid WNL AFI- 7.7cm SDP- 3.2cm, UA Doppler RI-0.57 & 0.62,FHR-166 bpm       A copy of this report including all images has been saved and backed up to  a second source for retrieval if needed. All measures and details of the  anatomical scan, placentation, fluid volume and pelvic anatomy are  contained in that report.  Lazarus Gowda 04/04/2014 3:00 PM  Clinical Impression and recommendations:  I have reviewed the sonogram results above, combined with the patient's  current clinical course, below are my impressions and any appropriate  recommendations for management based on the sonographic findings.   1.  [id number] Estimated Date of Delivery: 05/10/14  2.  Normal fetal sonographic findings, reassuring fetal surveillance,  normal Doppler flow, normal fluid volume 3.  Normal general sonographic findings  Recommend routine prenatal care based on this sonogram or as clinically  indicated  EURE,LUTHER H 04/04/2014 3:29 PM        US Fetal Bpp W/o Non Stress  04/01/2014   FOLLOW UP SONOGRAM   HONORE WIPPERFURTH is in the office for a follow up sonogram for  BPP/AFI/UAD.  She is a 38 y.o. year old [id number] with Estimated Date of Delivery:  05/10/14 now at  [redacted]w[redacted]d weeks gestation. Thus far the pregnancy has been  complicated by  AMA, CHTN .  GESTATION: SINGLETON  PRESENTATION: transverse  FETAL ACTIVITY:          Heart rate         145 bpm          The fetus is active.  AMNIOTIC FLUID: The amniotic fluid volume is  normal, 10.0 cm SDP-4.4cm.  PLACENTA LOCALIZATION:   GRADE 2  CERVIX: N/A  ADNEXA: The adnexa appears normal.   GESTATIONAL AGE AND  BIOMETRICS:  Gestational criteria: Estimated Date of Delivery: 05/10/14  now at  [redacted]w[redacted]d  BIOPHYSCIAL PROFILE:                                                                                                       [place]                 2   TONE                2   RESPIRATIONS                2   AMNIOTIC FLUID NST                2                0                                                            SCORE:  8/10  NST was  performed as part of this antepartum testing which had a NR  result    DOPPLER FLOW STUDIES: UMBILICAL ARTERY RI RATIOS:   0.61, 0.62    SUSPECTED ABNORMALITIES:  no  QUALITY OF SCAN: satisfactory  TECHNICIAN COMMENTS:  U/S(34+3wks)- transverse (fetal head on pt rt)- BPP 8/10 (NR NST), fluid  WNL AFI-10.0cm SDP-4.4cm, UA Doppler RI-0.62 & 0.61, Gr 2 placenta    A copy of this report including all images has been saved and backed up to  a second source for retrieval if needed. All measures and details of the  anatomical scan, placentation, fluid volume and pelvic anatomy are  contained in that report.  Lazarus Gowda 04/01/2014 12:56 PM Clinical Impression and recommendations:  I have reviewed the sonogram results above, combined with the patient's  current clinical course, below are my impressions and any appropriate  recommendations for management based on the sonographic findings.   1.  [id number] Estimated Date of Delivery: 05/10/14 by  early ultrasound and  confirmed by today's sonographic dating 2.  Normal fetal sonographic findings, with BPP 8/8 and excellent doppler  flow studies of placenta 3.  Normal general sonographic findings  Recommend routine prenatal care based on this sonogram or as clinically  indicated  FERGUSON,JOHN V 04/01/2014 10:51 PM         [place] Ua Cord Doppler  04/08/2014   FOLLOW UP  SONOGRAM   MONETTA LICK is in the office for a follow up sonogram for  BPP/AFI/UAD/EFW.  She is a 38 y.o. year old [id number] with Estimated Date of Delivery:  05/10/14 now at  [redacted]w[redacted]d weeks gestation. Thus far the pregnancy has been  complicated by  AMA HTN fibroids.  GESTATION: SINGLETON  PRESENTATION: cephalic  FETAL ACTIVITY:          Heart rate         133 bpm          The fetus is active.  AMNIOTIC FLUID: The amniotic fluid volume is  normal, 9.7 cm SDP-5.5cm.  PLACENTA LOCALIZATION:  anterior (see notes) GRADE 2  CERVIX: Unable to view due to fetal position  Lower uterine fibroid= 8.7 x 8.3cm  ADNEXA: The  Adnexa appears  normal.   GESTATIONAL AGE AND  BIOMETRICS:  Gestational criteria: Estimated Date of Delivery: 05/10/14  now at [redacted]w[redacted]d  Previous Scans:  GESTATIONAL SAC            mm          weeks  CROWN RUMP LENGTH            mm          weeks  NUCHAL TRANSLUCENCY            mm           BIPARIETAL DIAMETER           8.7 cm         35+1 weeks  HEAD CIRCUMFERENCE           31.47 cm         35+2 weeks  ABDOMINAL CIRCUMFERENCE           31.99 cm         35+6 weeks  FEMUR LENGTH           6.83 cm         35+1 weeks                                                           AVERAGE EGA(BY THIS SCAN):   35+2 weeks                                                 ESTIMATED FETAL WEIGHT:        2774  grams, 54 % ANATOMICAL SURVEY                                                                             COMMENTS CEREBRAL VENTRICLES yes normal   CHOROID PLEXUS yes normal   CEREBELLUM yes normal   CISTERNA MAGNA yes normal   NUCHAL REGION yes normal   ORBITS no    NASAL BONE no    NOSE/LIP no    FACIAL PROFILE no    4 CHAMBERED  HEART no    OUTFLOW TRACTS no    DIAPHRAGM yes normal   STOMACH yes normal   RENAL REGION yes normal   BLADDER yes normal   CORD INSERTION no    3 VESSEL CORD yes normal   SPINE no    ARMS/HANDS no    LEGS/FEET no    GENITALIA   female         BIOPHYSCIAL PROFILE:                                                                                                       COMMENTS GROSS BODY MOVEMENT                 2   TONE                2   RESPIRATIONS                2   AMNIOTIC FLUID                2                                                             SCORE:  8/8 (Note: NST was not performed as part of this antepartum testing)   DOPPLER FLOW STUDIES: UMBILICAL ARTERY RI RATIOS:   0.67, 0.62    SUSPECTED ABNORMALITIES:  no  QUALITY OF SCAN: satisfactory  TECHNICIAN COMMENTS:  U/S(35+3wks)-vtx active fetus, BPP 8/8, fluid WNL AFI-9.7cm SDP-5.5cm, EFW  6 lb 2 oz (54th%tile), UA Doppler RI- 0.64 & 0.67, lower uterine  fibroid=8.7 x 8.3cm, anterior gr 2 placenta with thickened uterine wall  noted posterior to placenta with ?fibroids noted within the wall (see  images/clips) uterine wall=4.6cm ??etiology?, FHR-133 bpm     A copy of this report including all images has been saved and backed up to  a second source for retrieval if needed. All measures and details of the  anatomical scan, placentation, fluid volume and pelvic anatomy are  contained in that report.  Lazarus Gowda 04/08/2014 9:17 AM       [place] Ua Cord Doppler  04/04/2014   FOLLOW UP SONOGRAM   KEAJA REAUME is in the office for a follow up sonogram for  BPP/UAD/AFI.  She is a 38 y.o. year old [id number] with Estimated Date of Delivery:  05/10/14 now at  [redacted]w[redacted]d weeks gestation. Thus far the pregnancy has been  complicated by AMA HTN.  GESTATION: SINGLETON  PRESENTATION: cephalic  FETAL ACTIVITY:          Heart rate         166 bpm          The fetus is active.  AMNIOTIC FLUID: The amniotic fluid volume is  normal, 7.7 cm SDP- 3.2cm.  PLACENTA LOCALIZATION:   GRADE 2  CERVIX:  N/A  ADNEXA: The adnexa appears normal.   GESTATIONAL AGE AND  BIOMETRICS:  Gestational criteria: Estimated Date of Delivery: 05/10/14  now at [redacted]w[redacted]d   BIOPHYSCIAL PROFILE:                                                                                                       [place]                 2   TONE                2   RESPIRATIONS                2   AMNIOTIC FLUID                2                                                            SCORE:  8/8 (Note: NST was not performed as part of  this antepartum testing)   DOPPLER FLOW STUDIES: UMBILICAL ARTERY RI RATIOS:   0.57, 0.62    SUSPECTED ABNORMALITIES:  no  QUALITY OF SCAN: satisfactory  TECHNICIAN COMMENTS:  U/S(34+6wks)-vtx (fetal head slightly on Pt rt), active fetus, BPP 8/8,  fluid WNL AFI- 7.7cm SDP- 3.2cm, UA Doppler RI-0.57 & 0.62,FHR-166 bpm       A copy of this report including all images has been saved and backed up to  a second source for retrieval if needed. All measures and details of the  anatomical scan, placentation, fluid volume and pelvic anatomy are  contained in that report.  Lazarus Gowda 04/04/2014 3:00 PM  Clinical Impression and recommendations:  I have reviewed the sonogram results above, combined with the patient's  current clinical course, below are my impressions and any appropriate  recommendations for management based on the sonographic findings.   1.  [id number] Estimated Date of Delivery: 05/10/14  2.  Normal fetal sonographic findings, reassuring fetal surveillance,  normal Doppler flow, normal fluid volume 3.  Normal general sonographic findings  Recommend routine prenatal care based on this sonogram or as clinically  indicated  EURE,LUTHER H 04/04/2014 3:29 PM        [place] Ua Cord Doppler  04/01/2014   FOLLOW UP SONOGRAM   FAYE STROHMAN is in the office for a follow up sonogram for  BPP/AFI/UAD.  She is a 38 y.o. year old [id number] with Estimated Date of Delivery:  05/10/14 now at  [redacted]w[redacted]d weeks gestation. Thus far the pregnancy has been  complicated by AMA, CHTN .  GESTATION: SINGLETON  PRESENTATION: transverse  FETAL ACTIVITY:          Heart rate         145 bpm          The fetus is active.  AMNIOTIC FLUID: The  amniotic fluid volume is  normal, 10.0 cm SDP-4.4cm.  PLACENTA LOCALIZATION:   GRADE 2  CERVIX: N/A  ADNEXA: The adnexa appears normal.   GESTATIONAL AGE AND  BIOMETRICS:  Gestational criteria: Estimated Date of Delivery: 05/10/14  now at [redacted]w[redacted]d  BIOPHYSCIAL PROFILE:                                                                                                        [place]                 2   TONE                2   RESPIRATIONS                2   AMNIOTIC FLUID NST                2                0                                                            SCORE:  8/10  NST was  performed as part of this antepartum testing which had a NR  result    DOPPLER FLOW STUDIES: UMBILICAL ARTERY RI RATIOS:   0.61, 0.62    SUSPECTED ABNORMALITIES:  no  QUALITY OF SCAN: satisfactory  TECHNICIAN COMMENTS:  U/S(34+3wks)- transverse (fetal head on pt rt)- BPP 8/10 (NR NST), fluid  WNL AFI-10.0cm SDP-4.4cm, UA Doppler RI-0.62 & 0.61, Gr 2 placenta    A copy of this report including all images has been saved and backed up to  a second source for retrieval if needed. All measures and details of the  anatomical scan, placentation, fluid volume and pelvic anatomy are  contained in that report.  Lazarus Gowda 04/01/2014 12:56 PM Clinical Impression and recommendations:  I have reviewed the sonogram results above, combined with the patient's  current clinical course, below are my impressions and any appropriate  recommendations for management based on the sonographic findings.   1.  [id number] Estimated Date of Delivery: 05/10/14 by  early ultrasound and  confirmed by today's sonographic dating 2.  Normal fetal sonographic findings, with BPP 8/8 and excellent doppler  flow studies of placenta 3.  Normal general sonographic findings  Recommend routine prenatal care based on this sonogram or as clinically  indicated  FERGUSON,JOHN V 04/01/2014 10:51 PM         Dg Abd 2 Views  04/27/2014   CLINICAL DATA:  Left lower quadrant pain, recent C-section  EXAM: ABDOMEN - 2 VIEW  COMPARISON:  None.  FINDINGS: Scattered large and small bowel gas is noted. Fecal material is noted within the colon. No free air is seen. No abnormal mass or abnormal calcifications are noted. No acute bony  abnormality is seen.  IMPRESSION: No  acute abnormality noted.   Electronically Signed   By: Inez Catalina M.D.   On: 04/27/2014 16:43    Microbiology: Recent Results (from the past 240 hour(s))  MRSA PCR Screening     Status: None   Collection Time: 04/22/14  9:38 PM  Result Value Ref Range Status   MRSA by PCR NEGATIVE NEGATIVE Final    Comment:        The GeneXpert MRSA Assay (FDA approved for NASAL specimens only), is one component of a comprehensive MRSA colonization surveillance program. It is not intended to diagnose MRSA infection nor to guide or monitor treatment for MRSA infections.   Urine culture     Status: None   Collection Time: 04/27/14  5:23 PM  Result Value Ref Range Status   Specimen Description URINE, CLEAN CATCH  Final   Special Requests NONE  Final   Culture  Setup Time   Final    04/28/2014 22:07 Performed at [facility]   Final    70,000 COLONIES/ML Performed at [place] Performed at [facility]    Report Status 04/30/2014 FINAL  Final   Organism ID, Bacteria PROTEUS MIRABILIS  Final      Susceptibility   Proteus mirabilis - MIC*    AMPICILLIN <=2 SENSITIVE Sensitive     CEFAZOLIN <=4 SENSITIVE Sensitive     CEFTRIAXONE <=1 SENSITIVE Sensitive     CIPROFLOXACIN <=0.25 SENSITIVE Sensitive     GENTAMICIN <=1 SENSITIVE Sensitive     LEVOFLOXACIN <=0.12 SENSITIVE Sensitive     NITROFURANTOIN 128 RESISTANT Resistant     TOBRAMYCIN <=1 SENSITIVE Sensitive     TRIMETH/SULFA <=20 SENSITIVE Sensitive     PIP/TAZO <=4 SENSITIVE Sensitive     * PROTEUS MIRABILIS     Labs: Basic Metabolic Panel:  Recent Labs Lab 04/25/14 0608 04/26/14 0855 04/27/14 0624 04/28/14 0555 04/29/14 0603 04/30/14 0608  NA 143 141 141 139 141  --   K 3.9 4.1 4.6 4.1 4.0  --   CL 104 101 101 98 102  --   CO2 28 29 29 29 28   --   GLUCOSE 116* 102* 109* 108* 117*  --   BUN 17 14 17 15 18   --   CREATININE 0.94 0.84  0.93 0.97 0.99  --   CALCIUM 8.4 9.0 9.1 9.1 8.9  --   MG  --   --  2.1 2.2 2.2 2.3   Liver Function Tests: No results for input(s): AST, ALT, ALKPHOS, BILITOT, PROT, ALBUMIN in the last 168 hours. No results for input(s): LIPASE, AMYLASE in the last 168 hours. No results for input(s): AMMONIA in the last 168 hours. CBC:  Recent Labs Lab 04/24/14 0539 04/27/14 1619 04/28/14 0553 04/29/14 0603  WBC 7.8 11.5* 11.0* 8.4  HGB 8.5* 10.6* 10.1* 9.6*  HCT 26.7* 34.0* 32.8* 30.9*  MCV 73.8* 73.6* 73.4* 73.2*  PLT 290 368 363 342   Cardiac Enzymes: No results for input(s): CKTOTAL, CKMB, CKMBINDEX, TROPONINI in the last 168 hours. BNP: BNP (last 3 results)  Recent Labs  04/08/14 1545 04/22/14 1624  PROBNP 1854.0* 2380.0*   CBG:  Recent Labs Lab 04/29/14 0730 04/29/14 1143 04/29/14 1653 04/29/14 2222 04/30/14 0822  GLUCAP 150* 149* 164* 99 118*       Signed:  Alveena Taira S  [facility] 04/30/2014,  10:48 AM

## 2014-04-30 NOTE — Care Management Utilization Note (Signed)
UR complete 

## 2014-04-30 NOTE — Progress Notes (Signed)
Consulting cardiologist: Kate Sable MD Primary Cardiologist: Carlyle Dolly MD  Cardiology Specific Problem List: 1. 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 Hypertension 3. Cardiomyopathy   Subjective:    Feels better, wants to go home.   Objective:   Temp:  [97.7 F (36.5 C)-98.2 F (36.8 C)] 97.7 F (36.5 C) (12/15 0615) Pulse Rate:  [86-91] 86 (12/15 0615) Resp:  [20] 20 (12/15 0615) BP: (136-155)/(81-95) 144/88 mmHg (12/15 0615) SpO2:  [98 %] 98 % (12/15 0615) Weight:  [233 lb 9.6 oz (105.96 kg)] 233 lb 9.6 oz (105.96 kg) (12/15 0615) Last BM Date: 04/28/14  Filed Weights   04/28/14 0635 04/29/14 0500 04/30/14 0615  Weight: 240 lb 1.6 oz (108.909 kg) 238 lb 8 oz (108.183 kg) 233 lb 9.6 oz (105.96 kg)    Intake/Output Summary (Last 24 hours) at 04/30/14 0841 Last data filed at 04/30/14 0102  Gross per 24 hour  Intake    960 ml  Output   3000 ml  Net  -2040 ml    Telemetry: NSR   Exam:  General: No acute distress.  HEENT: Conjunctiva and lids normal, oropharynx clear.  Lungs: Clear to auscultation, nonlabored.  Cardiac: No elevated JVP or bruits. RRR, no gallop or rub.   Abdomen: Normoactive bowel sounds, nontender, nondistended.  Extremities: No pitting edema, distal pulses full.  Neuropsychiatric: Alert and oriented x3, affect appropriate.   Lab Results:  Basic Metabolic Panel:  Recent Labs Lab 04/27/14 0624 04/28/14 0555 04/29/14 0603 04/30/14 0608  NA 141 139 141  --   K 4.6 4.1 4.0  --   CL 101 98 102  --   CO2 29 29 28   --   GLUCOSE 109* 108* 117*  --   BUN 17 15 18   --   CREATININE 0.93 0.97 0.99  --   CALCIUM 9.1 9.1 8.9  --   MG 2.1 2.2 2.2 2.3    CBC:  Recent Labs Lab 04/27/14 1619 04/28/14 0553 04/29/14 0603  WBC 11.5* 11.0* 8.4  HGB 10.6* 10.1* 9.6*  HCT 34.0* 32.8* 30.9*  MCV 73.6* 73.4* 73.2*  PLT 368 363 342    Cardiac Enzymes:  Recent Labs Lab 04/23/14 0855  TROPONINI <0.30     BNP:  Recent Labs  04/08/14 1545 04/22/14 1624  PROBNP 1854.0* 2380.0*     Medications:   Scheduled Medications: . amLODipine  10 mg Oral Daily  . aspirin  81 mg Oral Daily  . carvedilol  37.5 mg Oral BID WC  . enoxaparin (LOVENOX) injection  40 mg Subcutaneous Q24H  . furosemide  20 mg Intravenous BID  . hydrALAZINE  100 mg Oral 3 times per day  . insulin aspart  0-15 Units Subcutaneous TID WC  . insulin aspart  0-5 Units Subcutaneous QHS  . lisinopril  40 mg Oral Daily  . pantoprazole  40 mg Oral Daily  . polyethylene glycol  17 g Oral BID  . potassium chloride SA  40 mEq Oral Daily  . senna  2 tablet Oral QHS  . sodium chloride  3 mL Intravenous Q12H  . spironolactone  25 mg Oral Daily    Infusions:    PRN Medications: sodium chloride, acetaminophen, bisacodyl, LORazepam, morphine injection, ondansetron (ZOFRAN) IV, oxyCODONE, promethazine, simethicone, sodium chloride   Assessment and Plan:   1. Acute on Chronic Systolic CHF: EF of 72%: She has diuresed 22.175 liters since admission with wt loss of 33 lbs. She will be transitioned  to po torsemide, 40 mg daily, continue spironolactone, carvedilol, with titration up on follow up visit in 2 weeks. I have made appt for her on Dec 29th at 2:30 pm. Will need to repeat BMET prior to appt. I have counseled her on low sodium diet. I have asked her to weigh daily and bring record to follow up appt.   2. Hypertension: Much better with addition of spironolactone and continued amlodipine, lisinopril and hydralazine. She still has some mild LEE likely related to use of CCB,  and apresoline. Close follow up as OP.  3. OSA: Contributing to above. Follow up as OP concerning better fitting CPAP that she will tolerate.   4. Anemia: Noted to have Hgb drop of 1 gm since yesterday. Still having some post partem bleeding. May need iron replacement. Defer to Ob-GYN. Follow up CBC next week with BMET.  Phill Myron. Lawrence NP  [facility]  04/30/2014, 8:41 AM

## 2014-05-01 ENCOUNTER — Telehealth: Payer: Self-pay | Admitting: Obstetrics & Gynecology

## 2014-05-01 LAB — US OB FOLLOW UP

## 2014-05-01 NOTE — Telephone Encounter (Signed)
Pt states was seen by Dr. Elonda Husky this week was sent to ER for fluid. Pt states she was discharged from hospital yesterday and now c/o burning after voiding and abdominal pain. Pt states hospital told her to f/u with [facility]. Call transferred to front staff for soonest available appt.

## 2014-05-02 ENCOUNTER — Telehealth: Payer: Self-pay | Admitting: Obstetrics and Gynecology

## 2014-05-02 ENCOUNTER — Ambulatory Visit: Payer: Medicaid Other | Admitting: Obstetrics and Gynecology

## 2014-05-02 LAB — URINE CULTURE: Colony Count: 30000

## 2014-05-02 NOTE — Telephone Encounter (Signed)
Pt cancelled her appt here this morning. She reports that she went to [facility] last night due to continued pain in back, with CT scan done there, pt reports she was told she had pancreatitis.Pt advised to get records for inclusion in EPIC. Pt feels much better this morning.  Today's weight reported as 225, down from 286, when admitted to [facility] last week. Pt has cardiology appt  12/29 Pt asked to make appt  Here next week  Will pursue records from last night ED visit at [facility]

## 2014-05-03 LAB — ALDOSTERONE + RENIN ACTIVITY W/ RATIO
ALDO / PRA Ratio: 41.7 Ratio — ABNORMAL HIGH (ref 0.9–28.9)
Aldosterone: 5 ng/dL
PRA LC/MS/MS: 0.12 ng/mL/h — ABNORMAL LOW (ref 0.25–5.82)

## 2014-05-08 ENCOUNTER — Encounter: Payer: Medicaid Other | Admitting: Obstetrics and Gynecology

## 2014-05-14 ENCOUNTER — Encounter: Payer: Self-pay | Admitting: Adult Health

## 2014-05-14 ENCOUNTER — Ambulatory Visit (INDEPENDENT_AMBULATORY_CARE_PROVIDER_SITE_OTHER): Payer: Medicaid Other | Admitting: Adult Health

## 2014-05-14 VITALS — BP 158/90 | HR 79 | Ht 66.0 in | Wt 217.4 lb

## 2014-05-14 DIAGNOSIS — I10 Essential (primary) hypertension: Secondary | ICD-10-CM

## 2014-05-14 DIAGNOSIS — E669 Obesity, unspecified: Secondary | ICD-10-CM

## 2014-05-14 NOTE — Assessment & Plan Note (Signed)
She is doing very well. She is still losing wt and watching salt. She denies edema or DOE. She is medically compliant. Will continue her on curent regimen. I will request labs from [facility] to evaluate kidney fx status. She will be seen again in 3 months unless symptomatic. She is congratulated on her progress.

## 2014-05-14 NOTE — Patient Instructions (Signed)
Your physician recommends that you schedule a follow-up appointment in: 3 months with Kathryn Lawrence, NP   Your physician recommends that you continue on your current medications as directed. Please refer to the Current Medication list given to you today.  Thank you for choosing [facility]!     

## 2014-05-14 NOTE — Assessment & Plan Note (Signed)
She has lost approx 70 lbs from recent hospitalization and diureses and cholecystectomy. She states that she is watching salt and isn't hungry. I have congratulated her on her weight loss and have encouraged her to maintain healthy lifestyle.

## 2014-05-14 NOTE — Progress Notes (Signed)
HPI: Chelsey Santiago, and is a 38 year old, obese female patient, that we follow for ongoing assessment and management of hypertensive heart disease, who was recently discharged from the hospital after having postpartum CHF. She was treated with IV Lasix with excellent diuresis of approximately 20 pounds. The patient was continued on amlodipine, carvedilol, lisinopril, hydralazine, spironolactone, and torsemide 40 mg daily. She is here for post hospitalization followup for ongoing management. She has lost another 20 lbs since being seen last. She has also been hospitalized one week ago for cholecystectomy at [facility] due to obstructing gallstone. She continues to be medically compliant, weighing daily and watching salt intake. She states she feels very well and is has no cardiac complaints.   Allergies  Allergen Reactions  . Diclofenac     Edema  . Tramadol Nausea And Vomiting  . Vicodin [Hydrocodone-Acetaminophen] Nausea Only    Current Outpatient Prescriptions  Medication Sig Dispense Refill  . amLODipine (NORVASC) 5 MG tablet Take 2 tablets (10 mg total) by mouth daily. 30 tablet 0  . butalbital-acetaminophen-caffeine (FIORICET) 50-325-40 MG per tablet Take 1-2 tablets by mouth every 6 (six) hours as needed for headache. 30 tablet 1  . carvedilol (COREG) 12.5 MG tablet Take 3 tablets (37.5 mg total) by mouth 2 (two) times daily with a meal. 60 tablet 2  . hydrALAZINE (APRESOLINE) 100 MG tablet Take 1 tablet (100 mg total) by mouth every 8 (eight) hours. 90 tablet 2  . lisinopril (PRINIVIL,ZESTRIL) 20 MG tablet Take 2 tablets (40 mg total) by mouth daily. 30 tablet 2  . oxyCODONE-acetaminophen (PERCOCET/ROXICET) 5-325 MG per tablet Take 1 tablet by mouth every 4 (four) hours as needed (for pain scale less than 7). 30 tablet 0  . polyethylene glycol (MIRALAX / GLYCOLAX) packet Take 17 g by mouth 2 (two) times daily. 14 each 0  . potassium chloride SA (K-DUR,KLOR-CON) 20 MEQ tablet Take  2 tablets (40 mEq total) by mouth daily. 30 tablet 2  . promethazine (PHENERGAN) 12.5 MG tablet Take 1 tablet (12.5 mg total) by mouth every 6 (six) hours as needed for refractory nausea / vomiting. 30 tablet 0  . senna (SENOKOT) 8.6 MG TABS tablet Take 2 tablets (17.2 mg total) by mouth daily as needed for mild constipation. 30 each 0  . spironolactone (ALDACTONE) 25 MG tablet Take 1 tablet (25 mg total) by mouth daily. 30 tablet 2  . torsemide (DEMADEX) 20 MG tablet Take 2 tablets (40 mg total) by mouth daily. 60 tablet 1  . venlafaxine XR (EFFEXOR-XR) 75 MG 24 hr capsule Take 1 capsule (75 mg total) by mouth daily. (Patient taking differently: Take 75 mg by mouth daily as needed (For anxiety.). ) 30 capsule 3   No current facility-administered medications for this visit.    Past Medical History  Diagnosis Date  . Hypertension     Lab: Normal BMet except glucose of 118 in 09/2010  . Hypertensive heart disease 2009    Pulmonary edema postpartum; mild to moderate mitral regurgitation when hospitalized for CHF in 2009; Echocardiogram in 12/2009-no MR and normal EF; normal CXR in 09/2010  . Migraine headache   . Anemia     H&H of 10.6/33 and 07/2008 and 11.9/35 and 09/2010  . Osteoarthritis, knee 03/29/2011  . Sleep apnea   . Depression with anxiety   . Fasting hyperglycemia   . Obesity 04/16/2009  . CHF (congestive heart failure)   . Pregnant   . Diabetes mellitus without complication   .  Anxiety   . Enlarged heart   . Pulmonary edema   . Preeclampsia   . Threatened abortion in early pregnancy 03/15/2013  . Miscarriage 03/19/2013    Past Surgical History  Procedure Laterality Date  . Breast reduction surgery  2002  . Cesarean section N/A 04/09/2014    Procedure: CESAREAN SECTION;  Surgeon: Mora Bellman, MD;  Location: [facility];  Service: Obstetrics;  Laterality: N/A;    ROS: Complete review of systems performed and found to be negative unless outlined above PHYSICAL EXAM BP 158/90  mmHg  Pulse 79  Ht 5\' 6"  (1.676 m)  Wt 217 lb 6.4 oz (98.612 kg)  BMI 35.11 kg/m2  SpO2 99% General: Well developed, well nourished, in no acute distress Head: Eyes PERRLA, No xanthomas.   Normal cephalic and atramatic  Lungs: Clear bilaterally to auscultation and percussion. Heart: HRRR S1 S2, without MRG.  Pulses are 2+ & equal.            No carotid bruit. No JVD.  No abdominal bruits. No femoral bruits. Abdomen: Bowel sounds are positive, abdomen soft and non-tender without masses or                  Hernia's noted. Msk:  Back normal, normal gait. Normal strength and tone for age. Extremities: No clubbing, cyanosis or edema.  DP +1 Neuro: Alert and oriented X 3. Psych:  Good affect, responds appropriately   ASSESSMENT AND PLAN

## 2014-05-14 NOTE — Assessment & Plan Note (Signed)
Better controlled. Rechecked in exam room. 142/76. She will continue current medication regimen. Watch closely as she continues to lose weight. See again in 3 months.

## 2014-05-14 NOTE — Progress Notes (Deleted)
Name: Chelsey Santiago    DOB: 20-Sep-1975  Age: 38 y.o.  MR#: [id number]       PCP:  No PCP Per Patient      Insurance: Payor: MEDICAID [place] / Plan: MEDICAID OF [place] / Product Type: *No Product type* /   CC:    Chief Complaint  Patient presents with  . Hypertension  . Congestive Heart Failure    VS Filed Vitals:   05/14/14 1539  BP: 158/90  Pulse: 79  Height: 5\' 6"  (1.676 m)  Weight: 217 lb 6.4 oz (98.612 kg)  SpO2: 99%    Weights Current Weight  05/14/14 217 lb 6.4 oz (98.612 kg)  04/30/14 233 lb 9.6 oz (105.96 kg)  04/22/14 286 lb (129.729 kg)    Blood Pressure  BP Readings from Last 3 Encounters:  05/14/14 158/90  04/30/14 144/88  04/22/14 170/110     Admit date:  (Not on file) Last encounter with RMR:  Visit date not found   Allergy Diclofenac; Tramadol; and Vicodin  Current Outpatient Prescriptions  Medication Sig Dispense Refill  . amLODipine (NORVASC) 5 MG tablet Take 2 tablets (10 mg total) by mouth daily. 30 tablet 0  . butalbital-acetaminophen-caffeine (FIORICET) 50-325-40 MG per tablet Take 1-2 tablets by mouth every 6 (six) hours as needed for headache. 30 tablet 1  . carvedilol (COREG) 12.5 MG tablet Take 3 tablets (37.5 mg total) by mouth 2 (two) times daily with a meal. 60 tablet 2  . hydrALAZINE (APRESOLINE) 100 MG tablet Take 1 tablet (100 mg total) by mouth every 8 (eight) hours. 90 tablet 2  . lisinopril (PRINIVIL,ZESTRIL) 20 MG tablet Take 2 tablets (40 mg total) by mouth daily. 30 tablet 2  . oxyCODONE-acetaminophen (PERCOCET/ROXICET) 5-325 MG per tablet Take 1 tablet by mouth every 4 (four) hours as needed (for pain scale less than 7). 30 tablet 0  . polyethylene glycol (MIRALAX / GLYCOLAX) packet Take 17 g by mouth 2 (two) times daily. 14 each 0  . potassium chloride SA (K-DUR,KLOR-CON) 20 MEQ tablet Take 2 tablets (40 mEq total) by mouth daily. 30 tablet 2  . promethazine (PHENERGAN) 12.5 MG tablet Take 1 tablet (12.5 mg total) by mouth every 6 (six)  hours as needed for refractory nausea / vomiting. 30 tablet 0  . senna (SENOKOT) 8.6 MG TABS tablet Take 2 tablets (17.2 mg total) by mouth daily as needed for mild constipation. 30 each 0  . spironolactone (ALDACTONE) 25 MG tablet Take 1 tablet (25 mg total) by mouth daily. 30 tablet 2  . torsemide (DEMADEX) 20 MG tablet Take 2 tablets (40 mg total) by mouth daily. 60 tablet 1  . venlafaxine XR (EFFEXOR-XR) 75 MG 24 hr capsule Take 1 capsule (75 mg total) by mouth daily. (Patient taking differently: Take 75 mg by mouth daily as needed (For anxiety.). ) 30 capsule 3   No current facility-administered medications for this visit.    Discontinued Meds:    Medications Discontinued During This Encounter  Medication Reason  . aspirin 81 MG tablet Error  . omeprazole (PRILOSEC) 20 MG capsule Error    Patient Active Problem List   Diagnosis Date Noted  . LLQ pain   . DM type 2 (diabetes mellitus, type 2) 04/28/2014  . Essential hypertension   . 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 Acute systolic CHF (congestive heart failure) 04/23/2014  . Hypertensive urgency 04/22/2014  . Acute CHF 04/22/2014  . S/P cesarean section 04/11/2014  . Pulmonary edema 04/11/2014  .  Postoperative anemia 04/11/2014  . Elevated serum creatinine 04/11/2014  . Preeclampsia, severe 04/09/2014  . Pre-eclampsia superimposed on chronic hypertension, antepartum 04/08/2014  . Dyspnea 04/08/2014  . Polyhydramnios in third trimester, antepartum 03/14/2014  . Abnormal maternal glucose tolerance, antepartum 03/11/2014  . High-risk pregnancy 03/11/2014  . Chronic hypertension in pregnancy 03/11/2014  . Impaired glucose tolerance during pregnancy, antepartum 11/27/2013  . Fibroids 11/22/2013  . History of gestational diabetes in prior pregnancy, currently pregnant in first trimester 11/22/2013  . Hx of preeclampsia, prior pregnancy, currently pregnant 11/22/2013  . Short interval between pregnancies affecting pregnancy,  antepartum 11/22/2013  . Supervision of high-risk pregnancy of elderly primigravida (>= 63 years old at delivery), third trimester 11/22/2013  . Miscarriage 03/19/2013  . 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 Hypertension   . Hypertensive heart disease   . Microcytic anemia   . Osteoarthritis, knee 03/29/2011  . SLEEP APNEA 12/09/2009  . Obesity 04/16/2009    LABS    Component Value Date/Time   NA 141 04/29/2014 0603   NA 139 04/28/2014 0555   NA 141 04/27/2014 0624   K 4.0 04/29/2014 0603   K 4.1 04/28/2014 0555   K 4.6 04/27/2014 0624   CL 102 04/29/2014 0603   CL 98 04/28/2014 0555   CL 101 04/27/2014 0624   CO2 28 04/29/2014 0603   CO2 29 04/28/2014 0555   CO2 29 04/27/2014 0624   GLUCOSE 117* 04/29/2014 0603   GLUCOSE 108* 04/28/2014 0555   GLUCOSE 109* 04/27/2014 0624   BUN 18 04/29/2014 0603   BUN 15 04/28/2014 0555   BUN 17 04/27/2014 0624   CREATININE 0.99 04/29/2014 0603   CREATININE 0.97 04/28/2014 0555   CREATININE 0.93 04/27/2014 0624   CREATININE 0.50 10/18/2013 0942   CREATININE 0.76 07/12/2011 1217   CREATININE 0.84 09/16/2010 1412   CALCIUM 8.9 04/29/2014 0603   CALCIUM 9.1 04/28/2014 0555   CALCIUM 9.1 04/27/2014 0624   GFRNONAA 71* 04/29/2014 0603   GFRNONAA 73* 04/28/2014 0555   GFRNONAA 77* 04/27/2014 0624   GFRAA 83* 04/29/2014 0603   GFRAA 85* 04/28/2014 0555   GFRAA 89* 04/27/2014 0624   CMP     Component Value Date/Time   NA 141 04/29/2014 0603   K 4.0 04/29/2014 0603   CL 102 04/29/2014 0603   CO2 28 04/29/2014 0603   GLUCOSE 117* 04/29/2014 0603   BUN 18 04/29/2014 0603   CREATININE 0.99 04/29/2014 0603   CREATININE 0.50 10/18/2013 0942   CALCIUM 8.9 04/29/2014 0603   PROT 6.2 04/22/2014 1624   ALBUMIN 2.3* 04/22/2014 1624   AST 23 04/22/2014 1624   ALT 24 04/22/2014 1624   ALKPHOS 102 04/22/2014 1624   BILITOT 0.2* 04/22/2014 1624   GFRNONAA 71* 04/29/2014 0603   GFRAA 83* 04/29/2014 0603       Component Value Date/Time    WBC 8.4 04/29/2014 0603   WBC 11.0* 04/28/2014 0553   WBC 11.5* 04/27/2014 1619   HGB 9.6* 04/29/2014 0603   HGB 10.1* 04/28/2014 0553   HGB 10.6* 04/27/2014 1619   HGB 9.7* 10/18/2012 1332   HCT 30.9* 04/29/2014 0603   HCT 32.8* 04/28/2014 0553   HCT 34.0* 04/27/2014 1619   MCV 73.2* 04/29/2014 0603   MCV 73.4* 04/28/2014 0553   MCV 73.6* 04/27/2014 1619    Lipid Panel     Component Value Date/Time   CHOL 154 11/20/2004   TRIG 225 11/20/2004   HDL 38 11/20/2004   LDLCALC 71 11/20/2004  ABG    Component Value Date/Time   TCO2 28 09/21/2010 1314     Lab Results  Component Value Date   TSH 1.110 04/22/2014   BNP (last 3 results)  Recent Labs  04/08/14 1545 04/22/14 1624  PROBNP 1854.0* 2380.0*   Cardiac Panel (last 3 results) No results for input(s): CKTOTAL, CKMB, TROPONINI, RELINDX in the last 72 hours.  Iron/TIBC/Ferritin/ %Sat No results found for: IRON, TIBC, FERRITIN, IRONPCTSAT   EKG Orders placed or performed during the hospital encounter of 04/22/14  . ED EKG  . ED EKG  . ED EKG  . ED EKG  . EKG 12-Lead  . EKG 12-Lead  . EKG     Prior Assessment and Plan Problem List as of 05/14/2014    Obesity   Last Assessment & Plan   07/12/2011 Office Visit Written 07/12/2011  1:28 PM by Yehuda Savannah, MD    Patient has lost a significant amount of weight.  She is motivated to continue to exercise and restrict calories.  I congratulated her on her efforts to date and encouraged her to continue.    SLEEP APNEA   Osteoarthritis, knee   Last Assessment & Plan   07/12/2011 Office Visit Written 07/12/2011  1:32 PM by Yehuda Savannah, MD    Patient has been told of severe degenerative joint disease of both knees.  Treatment has been difficult either due to lack of efficacy or adverse effects.  Injections with hyaluronic acid have not been tried, and/or an alternative therapeutic consideration.  She also has plans to be seen in a local pain clinic.  She has  been told that medically she requires TKA, but is too young to reasonably undergo bilateral procedures.    Hypertension   Last Assessment & Plan   08/04/2012 Routine Prenatal Written 08/04/2012 11:19 AM by Florian Buff, MD    Labetalol 400 BID    Hypertensive heart disease   Last Assessment & Plan   05/23/2012 Office Visit Written 05/23/2012  2:07 PM by Lendon Colonel, NP    Her BP is not well controlled. I have retaken the BP manually in the exam room and found it to be 162./100 with HR of 98.  I will increase labetalol to 400 mg BID and have asked her not to take the Goody Powders during her pregnancy. She will continue procardia 90- mg as directed. She will follow up with Dr.Rothbart in 2 months for carefull monitoring of her BP during pregnancy to avoid CHF or hypertensive urgency.    Microcytic anemia   Last Assessment & Plan   07/12/2011 Office Visit Written 07/12/2011  1:25 PM by Yehuda Savannah, MD    Mild anemia in 2010 had virtually resolved by 09/2010; CBC will be repeated.    Major depression   Last Assessment & Plan   09/27/2011 Office Visit Written 09/28/2011  1:27 PM by Carolin Guernsey, MD    Poorly controlled per patient, but she has follow-up with [facility] regarding this.     Miscarriage   Fibroids   History of gestational diabetes in prior pregnancy, currently pregnant in first trimester   Hx of preeclampsia, prior pregnancy, currently pregnant   Short interval between pregnancies affecting pregnancy, antepartum   Supervision of high-risk pregnancy of elderly primigravida (>= 13 years old at delivery), third trimester   Impaired glucose tolerance during pregnancy, antepartum   Abnormal maternal glucose tolerance, antepartum   High-risk pregnancy   Chronic hypertension  in pregnancy   Polyhydramnios in third trimester, antepartum   Pre-eclampsia superimposed on chronic hypertension, antepartum   Dyspnea   Preeclampsia, severe   S/P cesarean section   Pulmonary edema    Postoperative anemia   Elevated serum creatinine   Hypertensive urgency   Acute CHF   Resistant hypertension   Acute systolic CHF (congestive heart failure)   Essential hypertension   DM type 2 (diabetes mellitus, type 2)   LLQ pain       Imaging: Dg Chest 2 View  04/22/2014   CLINICAL DATA:  Post partum and history of pre-eclampsia. Shortness of breath since delivery.  EXAM: CHEST  2 VIEW  COMPARISON:  04/18/2014 and 12/23/2011  FINDINGS: Again noted is enlargement of the cardiac silhouette. Prominent central vascular structures and cannot exclude congestion or mild edema. Again noted is a prominent azygos vein shadow. Again noted is mild blunting at the right costophrenic angle but no evidence for a large pleural effusion.  IMPRESSION: Cardiomegaly with concern for vascular congestion or mild edema. The congestion or mild edema has slightly progressed since 04/18/2014.   Electronically Signed   By: Markus Daft M.D.   On: 04/22/2014 15:53   Dg Chest 2 View  04/18/2014   CLINICAL DATA:  Delivered baby 9 days ago.  Leg pain and swelling.  EXAM: CHEST  2 VIEW  COMPARISON:  03/27/2014  FINDINGS: There is mild interstitial thickening. There is no focal parenchymal opacity, pleural effusion, or pneumothorax. There is stable cardiomegaly.  The osseous structures are unremarkable.  IMPRESSION: Cardiomegaly with bilateral interstitial thickening concerning for mild interstitial edema.   Electronically Signed   By: Kathreen Devoid   On: 04/18/2014 18:08   Dg Abd 2 Views  04/27/2014   CLINICAL DATA:  Left lower quadrant pain, recent C-section  EXAM: ABDOMEN - 2 VIEW  COMPARISON:  None.  FINDINGS: Scattered large and small bowel gas is noted. Fecal material is noted within the colon. No free air is seen. No abnormal mass or abnormal calcifications are noted. No acute bony abnormality is seen.  IMPRESSION: No acute abnormality noted.   Electronically Signed   By: Inez Catalina M.D.   On: 04/27/2014 16:43

## 2014-05-15 ENCOUNTER — Encounter: Payer: Medicaid Other | Admitting: Obstetrics and Gynecology

## 2014-05-20 ENCOUNTER — Encounter: Payer: Self-pay | Admitting: Adult Health

## 2014-05-22 NOTE — Discharge Summary (Signed)
Physician Discharge Summary  Patient ID: Chelsey Santiago MRN: [id number] DOB/AGE: 01/25/1976 39 y.o.  Admit date: 04/08/2014 Discharge date: 05/22/2014  Admission Diagnoses: S/P Caesarean section at 11 w 5 d for severe pre eclampsia superimposed on chronic hypertenshion with systolic heart failure and pulmonary edema   Discharge Diagnoses:  Principal Problem:   Preeclampsia, severe Active Problems:   Dyspnea   S/P cesarean section   Pulmonary edema   Postoperative anemia   Elevated serum creatinine   Discharged Condition: stable  Hospital Course: Pt was admitted for induction due to superimposed severe pre eclampsia but was found to have changed to transverse lie son underwent caesarean + BTL. Post op as expected she had pulmonary edema secondary to pregnacy, after load issues and borderline LV function.  She was aggressively diuresed and observed and BP managed aggressively  Consults: cardiology  Significant Diagnostic Studies: labs: sonogram  Treatments: surgery: Primary CS + BTL, diuresis, after load management  Discharge Exam: Blood pressure 152/87, pulse 79, temperature 97.6 F (36.4 C), temperature source Oral, resp. rate 17, height 5\' 6"  (1.676 m), weight 273 lb 4 oz (123.945 kg), last menstrual period 08/03/2013, SpO2 100 %, unknown if currently breastfeeding. General appearance: alert, cooperative and no distress Resp: clear to auscultation bilaterally Cardio: regular rate and rhythm, S1, S2 normal, no murmur, click, rub or gallop GI: normal post Cserean section  Disposition: 01-Home or Self Care  Discharge Instructions    Call MD for:  persistant nausea and vomiting    Complete by:  As directed      Call MD for:  severe uncontrolled pain    Complete by:  As directed      Call MD for:  temperature >100.4    Complete by:  As directed      Diet - low sodium heart healthy    Complete by:  As directed      Driving Restrictions    Complete by:  As directed   None     Increase activity slowly    Complete by:  As directed      Leave dressing on - Keep it clean, dry, and intact until clinic visit    Complete by:  As directed      Lifting restrictions    Complete by:  As directed   None except the baby     Sexual Activity Restrictions    Complete by:  As directed   None            Medication List    STOP taking these medications        aspirin 81 MG tablet     azithromycin 250 MG tablet  Commonly known as:  ZITHROMAX Z-PAK     chlorpheniramine-HYDROcodone 10-8 MG/5ML Lqcr  Commonly known as:  TUSSIONEX PENNKINETIC ER     glyBURIDE 5 MG tablet  Commonly known as:  DIABETA     labetalol 200 MG tablet  Commonly known as:  NORMODYNE     NIFEdipine 30 MG 24 hr tablet  Commonly known as:  PROCARDIA-XL/ADALAT CC     predniSONE 10 MG tablet  Commonly known as:  DELTASONE     promethazine 12.5 MG tablet  Commonly known as:  PHENERGAN     promethazine 25 MG tablet  Commonly known as:  PHENERGAN     ranitidine 150 MG tablet  Commonly known as:  ZANTAC      TAKE these medications        butalbital-acetaminophen-caffeine  50-325-40 MG per tablet  Commonly known as:  FIORICET  Take 1-2 tablets by mouth every 6 (six) hours as needed for headache.     potassium chloride SA 20 MEQ tablet  Commonly known as:  K-DUR,KLOR-CON  Take 2 tablets (40 mEq total) by mouth daily.     venlafaxine XR 75 MG 24 hr capsule  Commonly known as:  EFFEXOR-XR  Take 1 capsule (75 mg total) by mouth daily.           Follow-up Information    Follow up with Florian Buff, MD On 04/18/2014.   Specialties:  Obstetrics and Gynecology, Radiology   Why:  For wound re-check   Contact information:   [address] [id number] [phone]       Signed: Florian Buff 05/22/2014, 11:33 PM

## 2014-05-31 ENCOUNTER — Encounter: Payer: Self-pay | Admitting: *Deleted

## 2014-06-18 ENCOUNTER — Encounter: Payer: Self-pay | Admitting: *Deleted

## 2014-06-18 NOTE — Progress Notes (Unsigned)
Attempted to reach pt to schedule postpartum visit but I got her voicemail again.  Lmom.  AS  06/18/14

## 2014-07-16 NOTE — Progress Notes (Signed)
Patient ID: Chelsey Santiago, female   DOB: Sep 22, 1975, 39 y.o.   MRN: [id number]  HPI: Patient returns for routine postoperative follow-up having undergone Caesarean section/BTL on 04/10/2015. The patient's early postoperative recovery while in the hospital was notable for severe pre eclampsia and pulmonary edema +/- CHF. Since hospital discharge the patient reports on going swelling and SOB .   Current Outpatient Prescriptions  Medication Sig Dispense Refill  . butalbital-acetaminophen-caffeine (FIORICET) 50-325-40 MG per tablet Take 1-2 tablets by mouth every 6 (six) hours as needed for headache. 30 tablet 1  . potassium chloride SA (K-DUR,KLOR-CON) 20 MEQ tablet Take 2 tablets (40 mEq total) by mouth daily. 30 tablet 2  . venlafaxine XR (EFFEXOR-XR) 75 MG 24 hr capsule Take 1 capsule (75 mg total) by mouth daily. (Patient taking differently: Take 75 mg by mouth daily as needed (For anxiety.). ) 30 capsule 3  . amLODipine (NORVASC) 5 MG tablet Take 2 tablets (10 mg total) by mouth daily. 30 tablet 0  . carvedilol (COREG) 12.5 MG tablet Take 3 tablets (37.5 mg total) by mouth 2 (two) times daily with a meal. 60 tablet 2  . hydrALAZINE (APRESOLINE) 100 MG tablet Take 1 tablet (100 mg total) by mouth every 8 (eight) hours. 90 tablet 2  . lisinopril (PRINIVIL,ZESTRIL) 20 MG tablet Take 2 tablets (40 mg total) by mouth daily. 30 tablet 2  . oxyCODONE-acetaminophen (PERCOCET/ROXICET) 5-325 MG per tablet Take 1 tablet by mouth every 4 (four) hours as needed (for pain scale less than 7). 30 tablet 0  . polyethylene glycol (MIRALAX / GLYCOLAX) packet Take 17 g by mouth 2 (two) times daily. 14 each 0  . promethazine (PHENERGAN) 12.5 MG tablet Take 1 tablet (12.5 mg total) by mouth every 6 (six) hours as needed for refractory nausea / vomiting. 30 tablet 0  . senna (SENOKOT) 8.6 MG TABS tablet Take 2 tablets (17.2 mg total) by mouth daily as needed for mild constipation. 30 each 0  . spironolactone  (ALDACTONE) 25 MG tablet Take 1 tablet (25 mg total) by mouth daily. 30 tablet 2  . torsemide (DEMADEX) 20 MG tablet Take 2 tablets (40 mg total) by mouth daily. 60 tablet 1   No current facility-administered medications for this visit.    Physical Exam: Lungs crackles in both bases, wet sound Ext 4+ edema Abdomen incision clean dry intact soft normal post op non tender  Diagnostic Tests: none  Impression: Post op C/S with pulmonary edema Not improving Concerned about worsening LV functioning  Plan: I have talked to cardiology and she will be admitted for evaluation and management at [facility]

## 2014-10-30 ENCOUNTER — Other Ambulatory Visit: Payer: Self-pay

## 2014-10-30 ENCOUNTER — Telehealth: Payer: Self-pay | Admitting: Adult Health

## 2014-10-30 MED ORDER — VENLAFAXINE HCL ER 75 MG PO CP24: 75.0000 mg | ORAL_CAPSULE | Freq: Every day | ORAL | Status: DC

## 2014-10-30 MED ORDER — TORSEMIDE 20 MG PO TABS: 40.0000 mg | ORAL_TABLET | Freq: Every day | ORAL | Status: DC

## 2014-10-30 MED ORDER — AMLODIPINE BESYLATE 5 MG PO TABS: 10.0000 mg | ORAL_TABLET | Freq: Every day | ORAL | Status: DC

## 2014-10-30 MED ORDER — CARVEDILOL 12.5 MG PO TABS: 37.5000 mg | ORAL_TABLET | Freq: Two times a day (BID) | ORAL | Status: DC

## 2014-10-30 NOTE — Telephone Encounter (Signed)
Needs refill on all cardiac meds sent to [facility] / tg

## 2014-11-05 ENCOUNTER — Ambulatory Visit (INDEPENDENT_AMBULATORY_CARE_PROVIDER_SITE_OTHER): Payer: Medicaid Other | Admitting: Adult Health

## 2014-11-05 ENCOUNTER — Encounter: Payer: Self-pay | Admitting: Adult Health

## 2014-11-05 VITALS — BP 160/100 | HR 78 | Ht 66.0 in | Wt 241.6 lb

## 2014-11-05 DIAGNOSIS — I255 Ischemic cardiomyopathy: Secondary | ICD-10-CM

## 2014-11-05 MED ORDER — HYDRALAZINE HCL 50 MG PO TABS: 50.0000 mg | ORAL_TABLET | Freq: Three times a day (TID) | ORAL | Status: DC

## 2014-11-05 NOTE — Progress Notes (Deleted)
Name: Chelsey Santiago    DOB: October 16, 1975  Age: 39 y.o.  MR#: [id number]       PCP:  No PCP Per Patient      Insurance: Payor: MEDICAID [place] / Plan: MEDICAID [place] ACCESS / Product Type: *No Product type* /   CC:    Chief Complaint  Patient presents with  . Hypertension  . Cardiomyopathy  . Congestive Heart Failure    Post Partum    VS Filed Vitals:   11/05/14 1514  BP: 160/100  Pulse: 78  Height: 5\' 6"  (1.676 m)  Weight: 241 lb 9.6 oz (109.589 kg)  SpO2: 98%    Weights Current Weight  11/05/14 241 lb 9.6 oz (109.589 kg)  05/14/14 217 lb 6.4 oz (98.612 kg)  04/30/14 233 lb 9.6 oz (105.96 kg)    Blood Pressure  BP Readings from Last 3 Encounters:  11/05/14 160/100  05/14/14 158/90  04/30/14 144/88     Admit date:  (Not on file) Last encounter with RMR:  10/30/2014   Allergy Diclofenac; Tramadol; and Vicodin  Current Outpatient Prescriptions  Medication Sig Dispense Refill  . amLODipine (NORVASC) 5 MG tablet Take 2 tablets (10 mg total) by mouth daily. 30 tablet 0  . carvedilol (COREG) 12.5 MG tablet Take 3 tablets (37.5 mg total) by mouth 2 (two) times daily with a meal. 60 tablet 0  . lisinopril (PRINIVIL,ZESTRIL) 20 MG tablet Take 2 tablets (40 mg total) by mouth daily. 30 tablet 2  . potassium chloride SA (K-DUR,KLOR-CON) 20 MEQ tablet Take 2 tablets (40 mEq total) by mouth daily. 30 tablet 2  . spironolactone (ALDACTONE) 25 MG tablet Take 1 tablet (25 mg total) by mouth daily. 30 tablet 2  . torsemide (DEMADEX) 20 MG tablet Take 2 tablets (40 mg total) by mouth daily. 60 tablet 1   No current facility-administered medications for this visit.    Discontinued Meds:    Medications Discontinued During This Encounter  Medication Reason  . venlafaxine XR (EFFEXOR-XR) 75 MG 24 hr capsule Error  . senna (SENOKOT) 8.6 MG TABS tablet Error  . promethazine (PHENERGAN) 12.5 MG tablet Error  . polyethylene glycol (MIRALAX / GLYCOLAX) packet Error  .  oxyCODONE-acetaminophen (PERCOCET/ROXICET) 5-325 MG per tablet Error  . hydrALAZINE (APRESOLINE) 100 MG tablet Error  . butalbital-acetaminophen-caffeine (FIORICET) 50-325-40 MG per tablet Error    Patient Active Problem List   Diagnosis Date Noted  . LLQ pain   . DM type 2 (diabetes mellitus, type 2) 04/28/2014  . Essential hypertension   . 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 Hypertensive urgency 04/22/2014  . Acute CHF 04/22/2014  . S/P cesarean section 04/11/2014  . Pulmonary edema 04/11/2014  . Postoperative anemia 04/11/2014  . Elevated serum creatinine 04/11/2014  . Preeclampsia, severe 04/09/2014  . Pre-eclampsia superimposed on chronic hypertension, antepartum 04/08/2014  . Dyspnea 04/08/2014  . Polyhydramnios in third trimester, antepartum 03/14/2014  . Abnormal maternal glucose tolerance, antepartum 03/11/2014  . High-risk pregnancy 03/11/2014  . Chronic hypertension in pregnancy 03/11/2014  . Impaired glucose tolerance during pregnancy, antepartum 11/27/2013  . Fibroids 11/22/2013  . History of gestational diabetes in prior pregnancy, currently pregnant in first trimester 11/22/2013  . Hx of preeclampsia, prior pregnancy, currently pregnant 11/22/2013  . Short interval between pregnancies affecting pregnancy, antepartum 11/22/2013  . Supervision of high-risk pregnancy of elderly primigravida (>= 6 years old at delivery), third trimester 11/22/2013  . Miscarriage 03/19/2013  . 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 Hypertension   .  Hypertensive heart disease   . Microcytic anemia   . Osteoarthritis, knee 03/29/2011  . SLEEP APNEA 12/09/2009  . Obesity 04/16/2009    LABS    Component Value Date/Time   NA 141 04/29/2014 0603   NA 139 04/28/2014 0555   NA 141 04/27/2014 0624   K 4.0 04/29/2014 0603   K 4.1 04/28/2014 0555   K 4.6 04/27/2014 0624   CL 102 04/29/2014 0603   CL 98 04/28/2014 0555   CL 101 04/27/2014 0624   CO2 28 04/29/2014 0603   CO2 29 04/28/2014  0555   CO2 29 04/27/2014 0624   GLUCOSE 117* 04/29/2014 0603   GLUCOSE 108* 04/28/2014 0555   GLUCOSE 109* 04/27/2014 0624   BUN 18 04/29/2014 0603   BUN 15 04/28/2014 0555   BUN 17 04/27/2014 0624   CREATININE 0.99 04/29/2014 0603   CREATININE 0.97 04/28/2014 0555   CREATININE 0.93 04/27/2014 0624   CREATININE 0.50 10/18/2013 0942   CREATININE 0.76 07/12/2011 1217   CREATININE 0.84 09/16/2010 1412   CALCIUM 8.9 04/29/2014 0603   CALCIUM 9.1 04/28/2014 0555   CALCIUM 9.1 04/27/2014 0624   GFRNONAA 71* 04/29/2014 0603   GFRNONAA 73* 04/28/2014 0555   GFRNONAA 77* 04/27/2014 0624   GFRAA 83* 04/29/2014 0603   GFRAA 85* 04/28/2014 0555   GFRAA 89* 04/27/2014 0624   CMP     Component Value Date/Time   NA 141 04/29/2014 0603   K 4.0 04/29/2014 0603   CL 102 04/29/2014 0603   CO2 28 04/29/2014 0603   GLUCOSE 117* 04/29/2014 0603   BUN 18 04/29/2014 0603   CREATININE 0.99 04/29/2014 0603   CREATININE 0.50 10/18/2013 0942   CALCIUM 8.9 04/29/2014 0603   PROT 6.2 04/22/2014 1624   ALBUMIN 2.3* 04/22/2014 1624   AST 23 04/22/2014 1624   ALT 24 04/22/2014 1624   ALKPHOS 102 04/22/2014 1624   BILITOT 0.2* 04/22/2014 1624   GFRNONAA 71* 04/29/2014 0603   GFRAA 83* 04/29/2014 0603       Component Value Date/Time   WBC 8.4 04/29/2014 0603   WBC 11.0* 04/28/2014 0553   WBC 11.5* 04/27/2014 1619   HGB 9.6* 04/29/2014 0603   HGB 10.1* 04/28/2014 0553   HGB 10.6* 04/27/2014 1619   HGB 9.7* 10/18/2012 1332   HCT 30.9* 04/29/2014 0603   HCT 32.8* 04/28/2014 0553   HCT 34.0* 04/27/2014 1619   MCV 73.2* 04/29/2014 0603   MCV 73.4* 04/28/2014 0553   MCV 73.6* 04/27/2014 1619    Lipid Panel     Component Value Date/Time   CHOL 154 11/20/2004   TRIG 225 11/20/2004   HDL 38 11/20/2004   LDLCALC 71 11/20/2004    ABG    Component Value Date/Time   TCO2 28 09/21/2010 1314     Lab Results  Component Value Date   TSH 1.110 04/22/2014   BNP (last 3 results) No results  for input(s): BNP in the last 8760 hours.  ProBNP (last 3 results)  Recent Labs  04/08/14 1545 04/22/14 1624  PROBNP 1854.0* 2380.0*    Cardiac Panel (last 3 results) No results for input(s): CKTOTAL, CKMB, TROPONINI, RELINDX in the last 72 hours.  Iron/TIBC/Ferritin/ %Sat No results found for: IRON, TIBC, FERRITIN, IRONPCTSAT   EKG Orders placed or performed during the hospital encounter of 04/22/14  . ED EKG  . ED EKG  . ED EKG  . ED EKG  . EKG 12-Lead  . EKG 12-Lead  . EKG  Prior Assessment and Plan Problem List as of 11/05/2014      Cardiovascular and Mediastinum   Hypertension   Last Assessment & Plan 08/04/2012 Routine Prenatal Written 08/04/2012 11:19 AM by Florian Buff, MD    Labetalol 400 BID      Hypertensive heart disease   Last Assessment & Plan 05/23/2012 Office Visit Written 05/23/2012  2:07 PM by Lendon Colonel, NP    Her BP is not well controlled. I have retaken the BP manually in the exam room and found it to be 162./100 with HR of 98.  I will increase labetalol to 400 mg BID and have asked her not to take the Goody Powders during her pregnancy. She will continue procardia 90- mg as directed. She will follow up with Dr.Rothbart in 2 months for carefull monitoring of her BP during pregnancy to avoid CHF or hypertensive urgency.      Chronic hypertension in pregnancy   Pre-eclampsia superimposed on chronic hypertension, antepartum   Preeclampsia, severe   Hypertensive urgency   Acute CHF   Last Assessment & Plan 05/14/2014 Office Visit Written 05/14/2014  4:29 PM by Lendon Colonel, NP    She is doing very well. She is still losing wt and watching salt. She denies edema or DOE. She is medically compliant. Will continue her on curent regimen. I will request labs from [facility] to evaluate kidney fx status. She will be seen again in 3 months unless symptomatic. She is congratulated on her progress.       Resistant hypertension   Essential  hypertension   Last Assessment & Plan 05/14/2014 Office Visit Written 05/14/2014  4:31 PM by Lendon Colonel, NP    Better controlled. Rechecked in exam room. 142/76. She will continue current medication regimen. Watch closely as she continues to lose weight. See again in 3 months.        Respiratory   Pulmonary edema     Endocrine   Impaired glucose tolerance during pregnancy, antepartum   Abnormal maternal glucose tolerance, antepartum   DM type 2 (diabetes mellitus, type 2)     Musculoskeletal and Integument   Osteoarthritis, knee   Last Assessment & Plan 07/12/2011 Office Visit Written 07/12/2011  1:32 PM by Yehuda Savannah, MD    Patient has been told of severe degenerative joint disease of both knees.  Treatment has been difficult either due to lack of efficacy or adverse effects.  Injections with hyaluronic acid have not been tried, and/or an alternative therapeutic consideration.  She also has plans to be seen in a local pain clinic.  She has been told that medically she requires TKA, but is too young to reasonably undergo bilateral procedures.        Genitourinary   Fibroids     Other   Obesity   Last Assessment & Plan 05/14/2014 Office Visit Written 05/14/2014  4:33 PM by Lendon Colonel, NP    She has lost approx 70 lbs from recent hospitalization and diureses and cholecystectomy. She states that she is watching salt and isn't hungry. I have congratulated her on her weight loss and have encouraged her to maintain healthy lifestyle.       SLEEP APNEA   Microcytic anemia   Last Assessment & Plan 07/12/2011 Office Visit Written 07/12/2011  1:25 PM by Yehuda Savannah, MD    Mild anemia in 2010 had virtually resolved by 09/2010; CBC will be repeated.      Major depression  Last Assessment & Plan 09/27/2011 Office Visit Written 09/28/2011  1:27 PM by Carolin Guernsey, MD    Poorly controlled per patient, but she has follow-up with [facility] regarding this.        Miscarriage   History of gestational diabetes in prior pregnancy, currently pregnant in first trimester   Hx of preeclampsia, prior pregnancy, currently pregnant   Short interval between pregnancies affecting pregnancy, antepartum   Supervision of high-risk pregnancy of elderly primigravida (>= 6 years old at delivery), third trimester   High-risk pregnancy   Polyhydramnios in third trimester, antepartum   Dyspnea   S/P cesarean section   Postoperative anemia   Elevated serum creatinine   LLQ pain       Imaging: No results found.

## 2014-11-05 NOTE — Progress Notes (Signed)
Cardiology Office Note   Date:  11/05/2014   ID:  ZYIAH WITHINGTON, DOB 1975-06-25, MRN [id number]  PCP:  No PCP Per Patient  Cardiologist: Needs to be est. Jory Sims, NP   Chief Complaint  Patient presents with  . Hypertension  . Cardiomyopathy  . Congestive Heart Failure    Post Partum      History of Present Illness: Chelsey Santiago is a 39 y.o. female who presents for ongoing assessment and management of hypertensive heart disease, post partum CHF, echocardiogram completed during hospitalization in 2015:     Left ventricle: Severe hypokinesis of the inferior wall and the inferolateral wall. Hypokinesis of the other walls. EF 35% The cavity size was normal. Wall thickness was increased in a pattern of mild LVH. - Mitral valve: There was mild regurgitation. - Left atrium: The atrium was mildly dilated. - Right ventricle: The cavity size was normal. Systolic function was normal.  She was last seen in the office in December of 2015 after being hospitalized for cholecystectomy and [facility] do to obstructing gallstone.  The patient is medically compliant, having lost about 70 pounds since hospitalization.  She will need a followup echocardiogram ordered.   She comes today having gained 30 pounds,his hypertension, noncompliant with her medications as they made her feel that, she is tearful and crying, stating that her life is too stressful.  The patient has been noncompliant with diet.  She stopped taking her hydralazine and she stated made her feel bad.  She ran out of her meds for several weeks and did not refill them.  Her blood pressure today is 160/100.she states she has become so depressed that she does not want to go outside.  She wants to stay in bed all day.  Past Medical History  Diagnosis Date  . Hypertension     Lab: Normal BMet except glucose of 118 in 09/2010  . Hypertensive heart disease 2009    Pulmonary edema postpartum; mild to moderate  mitral regurgitation when hospitalized for CHF in 2009; Echocardiogram in 12/2009-no MR and normal EF; normal CXR in 09/2010  . Migraine headache   . Anemia     H&H of 10.6/33 and 07/2008 and 11.9/35 and 09/2010  . Osteoarthritis, knee 03/29/2011  . Sleep apnea   . Depression with anxiety   . Fasting hyperglycemia   . Obesity 04/16/2009  . CHF (congestive heart failure)   . Pregnant   . Diabetes mellitus without complication   . Anxiety   . Enlarged heart   . Pulmonary edema   . Preeclampsia   . Threatened abortion in early pregnancy 03/15/2013  . Miscarriage 03/19/2013    Past Surgical History  Procedure Laterality Date  . Breast reduction surgery  2002  . Cesarean section N/A 04/09/2014    Procedure: CESAREAN SECTION;  Surgeon: Chelsey Bellman, MD;  Location: [place] [facility];  Service: Obstetrics;  Laterality: N/A;     Current Outpatient Prescriptions  Medication Sig Dispense Refill  . amLODipine (NORVASC) 5 MG tablet Take 2 tablets (10 mg total) by mouth daily. 30 tablet 0  . carvedilol (COREG) 12.5 MG tablet Take 3 tablets (37.5 mg total) by mouth 2 (two) times daily with a meal. 60 tablet 0  . lisinopril (PRINIVIL,ZESTRIL) 20 MG tablet Take 2 tablets (40 mg total) by mouth daily. 30 tablet 2  . potassium chloride SA (K-DUR,KLOR-CON) 20 MEQ tablet Take 2 tablets (40 mEq total) by mouth daily. 30 tablet 2  . spironolactone (ALDACTONE)  25 MG tablet Take 1 tablet (25 mg total) by mouth daily. 30 tablet 2  . torsemide (DEMADEX) 20 MG tablet Take 2 tablets (40 mg total) by mouth daily. 60 tablet 1  . hydrALAZINE (APRESOLINE) 50 MG tablet Take 1 tablet (50 mg total) by mouth 3 (three) times daily. 90 tablet 11   No current facility-administered medications for this visit.    Allergies:   Diclofenac; Tramadol; and Vicodin    Social History:  The patient  reports that she has never smoked. She has never used smokeless tobacco. She reports that she does not drink alcohol or use illicit drugs.    Family History:  The patient's family history includes Diabetes in her mother; Heart disease in her mother; Hyperlipidemia in her paternal grandfather; Hypertension in her maternal uncle and paternal grandfather. There is no history of Sudden death or Heart attack.    ROS: .   All other systems are reviewed and negative.Unless otherwise mentioned in H&P above.   PHYSICAL EXAM: VS:  BP 160/100 mmHg  Pulse 78  Ht 5\' 6"  (1.676 m)  Wt 241 lb 9.6 oz (109.589 kg)  BMI 39.01 kg/m2  SpO2 98% , BMI Body mass index is 39.01 kg/(m^2). GEN: Well nourished, well developed, in no acute distress HEENT: normal Neck: no JVD, carotid bruits, or masses Cardiac: RRR; tachycardic,no murmurs, rubs, or gallops,no edema  Respiratory:  ldiminished in the bases.  No wheezing GI: soft, nontender, nondistended, + BSObese MS: no deformity or atrophy Skin: warm and dry, no rash Neuro:  Strength and sensation are intact Psych: euthymic mood,cryin  Recent Labs: 04/22/2014: ALT 24; Pro B Natriuretic peptide (BNP) 2380.0*; TSH 1.110 04/29/2014: BUN 18; Creatinine, Ser 0.99; Hemoglobin 9.6*; Platelets 342; Potassium 4.0; Sodium 141 04/30/2014: Magnesium 2.3    Lipid Panel    Component Value Date/Time   CHOL 154 11/20/2004   TRIG 225 11/20/2004   HDL 38 11/20/2004   LDLCALC 71 11/20/2004      Wt Readings from Last 3 Encounters:  11/05/14 241 lb 9.6 oz (109.589 kg)  05/14/14 217 lb 6.4 oz (98.612 kg)  04/30/14 233 lb 9.6 oz (105.96 kg)      Other studies Reviewed: Additional studies/ records that were reviewed today include: most recent echocardiogram, revealing an ejection fraction of 20%.   ASSESSMENT AND PLAN:  1.  Hypertensive cardiomyopathy;she comes today having gained 30 pounds and hypertensive.  She states that she does not take the torsemide as directed because it makes her urinate too much.  She stopped taking hydralazine because it made her feel bad.  She is eating salty foods and not  exercising.  Blood pressure is elevated today in the office at 160/100.  I am starting her back on hydralazine and 50 mg 3 times a day.  The patient has obstructive sleep apnea, but is not using her CPAP as directed.  She states is old.  I have asked her to have her replaced.  She knows where to take it in [place], where she originally got her for CPAP.  I have counseled her extensively concerning compliance as well as using the CPAP as directed as this will significantly impact her blood pressure.  Our repeat her echocardiogram.  I do not believe that this will show any significant improvement due to her non-adherence issues.The patient has been given a low sodium diet information along with nutritional label instructions.   I will see her back in a couple weeks to check her blood  pressure,Have echocardiogram report, reviewed.she is to take torsemide as directed, 2 tablets daily, along with [place], tone, and carvedilol.  She will have a BMET ordered  2. Depression She has a history of depression and is now taking antidepressants and also stop seeing a Social worker. I have re-referred her [facility] where she was originally being seen.  She states she will follow-up and restart counseling.  3. OSA: the patient has not been using CPAP as directed.  She states she will restart using it can take up a new machine as the one she has is old. Current medicines are reviewed at length with the patient today.    Labs/ tests ordered today include: echocardiogram in BMET  Orders Placed This Encounter  Procedures  . Echocardiogram     Disposition:   FU with 2 weeks.  Signed, Jory Sims, NP  11/05/2014 5:57 PM    [facility] [address] [place], [place] [address] Phone: [phone]; Fax: [phone]

## 2014-11-05 NOTE — Patient Instructions (Signed)
Your physician recommends that you schedule a follow-up appointment in: 2 weeks with Arnold Long, NP  Your physician has recommended you make the following change in your medication:   Start Taking Hydralazine 50 mg Three Times Daily.   You have been given information on [facility] [place] [facility]   Thank you for choosing [facility]!

## 2014-11-06 ENCOUNTER — Ambulatory Visit (HOSPITAL_COMMUNITY)
Admission: RE | Admit: 2014-11-06 | Discharge: 2014-11-06 | Disposition: A | Discharge status: Home / Self Care | Payer: Medicaid Other | Source: Ambulatory Visit | Attending: Adult Health | Admitting: Adult Health

## 2014-11-06 DIAGNOSIS — I420 Dilated cardiomyopathy: Secondary | ICD-10-CM | POA: Diagnosis present

## 2014-11-06 DIAGNOSIS — I255 Ischemic cardiomyopathy: Secondary | ICD-10-CM

## 2014-11-07 ENCOUNTER — Telehealth: Payer: Self-pay | Admitting: Adult Health

## 2014-11-07 NOTE — Telephone Encounter (Signed)
Results of echo/ tg  °

## 2014-11-08 ENCOUNTER — Telehealth: Payer: Self-pay

## 2014-11-08 ENCOUNTER — Other Ambulatory Visit: Payer: Self-pay

## 2014-11-08 MED ORDER — TORSEMIDE 20 MG PO TABS: 20.0000 mg | ORAL_TABLET | Freq: Every day | ORAL | Status: DC

## 2014-11-08 NOTE — Telephone Encounter (Signed)
-----   Message from Lendon Colonel, NP sent at 11/08/2014  7:14 AM EDT ----- Some improvement in EF from 25%-40%. She should continue torsemide, but one 20 mg tablet a day instead to two 20 mg tablets. See her on follow up.

## 2014-11-08 NOTE — Telephone Encounter (Signed)
Pt made aware of echo results, will decrease Torsemide to 20 mg daily

## 2014-11-08 NOTE — Telephone Encounter (Signed)
LMTCB-cc,changed rx dose

## 2014-11-19 ENCOUNTER — Encounter: Payer: Medicaid Other | Admitting: Adult Health

## 2014-11-19 NOTE — Progress Notes (Signed)
Cardiology Office Note   Date:  11/19/2014   ID:  Chelsey Santiago, DOB [birth year], MRN [id number]  PCP:  No PCP Per Patient  Cardiologist:   Jory Sims, NP   ERROR NO Show

## 2014-12-25 ENCOUNTER — Telehealth: Payer: Self-pay | Admitting: *Deleted

## 2014-12-25 NOTE — Telephone Encounter (Signed)
Spoke with cardiac rehab and LM for Amber that Dr. Harl Bowie ok'd cardiac program.

## 2014-12-25 NOTE — Telephone Encounter (Signed)
Amber from cardiac rehab says that pt does not meet requirements for medicaid to pay for cardiac rehab. There is another option of cardiac maintenance  program that pt would pay $67 monthly. Will forward to Dr. Harl Bowie. Pt was last seen by Jory Sims.

## 2014-12-25 NOTE — Telephone Encounter (Signed)
Please update patient on status and give option of the maintence program  Zandra Abts MD

## 2015-01-02 ENCOUNTER — Ambulatory Visit (HOSPITAL_COMMUNITY): Payer: Medicaid Other

## 2015-01-06 ENCOUNTER — Ambulatory Visit (HOSPITAL_COMMUNITY): Payer: Medicaid Other

## 2015-01-07 ENCOUNTER — Encounter: Payer: Medicaid Other | Admitting: Adult Health

## 2015-01-07 NOTE — Progress Notes (Signed)
Cardiology Office Note   Date:  01/07/2015   ID:  Chelsey Santiago, DOB 1975/10/18, MRN [id number]  PCP:  No PCP Per Patient  Cardiologist:   Jory Sims, NP   No Show    Signed, Jory Sims, NP  01/07/2015 7:32 AM    [facility] [address] [place], [place] [address] Phone: [phone]; Fax: [phone]

## 2015-01-08 ENCOUNTER — Ambulatory Visit (HOSPITAL_COMMUNITY): Payer: Medicaid Other

## 2015-01-10 ENCOUNTER — Ambulatory Visit (HOSPITAL_COMMUNITY): Payer: Medicaid Other

## 2015-01-13 ENCOUNTER — Ambulatory Visit (HOSPITAL_COMMUNITY): Payer: Medicaid Other

## 2015-01-13 ENCOUNTER — Encounter: Payer: Self-pay | Admitting: Adult Health

## 2015-01-13 ENCOUNTER — Encounter: Payer: Medicaid Other | Admitting: Adult Health

## 2015-01-13 NOTE — Progress Notes (Signed)
Cardiology Office Note   Date:  01/13/2015  ERROR No Show

## 2015-01-15 ENCOUNTER — Ambulatory Visit (HOSPITAL_COMMUNITY): Payer: Medicaid Other

## 2015-01-17 ENCOUNTER — Ambulatory Visit (HOSPITAL_COMMUNITY): Payer: Medicaid Other

## 2015-01-22 ENCOUNTER — Ambulatory Visit (HOSPITAL_COMMUNITY): Payer: Medicaid Other

## 2015-01-24 ENCOUNTER — Ambulatory Visit (HOSPITAL_COMMUNITY): Payer: Medicaid Other

## 2015-01-27 ENCOUNTER — Ambulatory Visit (HOSPITAL_COMMUNITY): Payer: Medicaid Other

## 2015-01-29 ENCOUNTER — Ambulatory Visit (HOSPITAL_COMMUNITY): Payer: Medicaid Other

## 2015-01-29 ENCOUNTER — Other Ambulatory Visit: Payer: Self-pay | Admitting: Adult Health

## 2015-01-31 ENCOUNTER — Ambulatory Visit (HOSPITAL_COMMUNITY): Payer: Medicaid Other

## 2015-02-03 ENCOUNTER — Ambulatory Visit (HOSPITAL_COMMUNITY): Payer: Medicaid Other

## 2015-02-05 ENCOUNTER — Ambulatory Visit (HOSPITAL_COMMUNITY): Payer: Medicaid Other

## 2015-02-07 ENCOUNTER — Ambulatory Visit (HOSPITAL_COMMUNITY): Payer: Medicaid Other

## 2015-02-10 ENCOUNTER — Ambulatory Visit (HOSPITAL_COMMUNITY): Payer: Medicaid Other

## 2015-02-12 ENCOUNTER — Ambulatory Visit (HOSPITAL_COMMUNITY): Payer: Medicaid Other

## 2015-02-14 ENCOUNTER — Ambulatory Visit (HOSPITAL_COMMUNITY): Payer: Medicaid Other

## 2015-02-17 ENCOUNTER — Ambulatory Visit (HOSPITAL_COMMUNITY): Payer: Medicaid Other

## 2015-02-19 ENCOUNTER — Ambulatory Visit (HOSPITAL_COMMUNITY): Payer: Medicaid Other

## 2015-02-21 ENCOUNTER — Ambulatory Visit (HOSPITAL_COMMUNITY): Payer: Medicaid Other

## 2015-02-24 ENCOUNTER — Ambulatory Visit (INDEPENDENT_AMBULATORY_CARE_PROVIDER_SITE_OTHER): Payer: Medicaid Other | Admitting: Adult Health

## 2015-02-24 ENCOUNTER — Ambulatory Visit (HOSPITAL_COMMUNITY): Payer: Medicaid Other

## 2015-02-24 ENCOUNTER — Encounter: Payer: Medicaid Other | Admitting: Adult Health

## 2015-02-24 ENCOUNTER — Encounter: Payer: Self-pay | Admitting: Adult Health

## 2015-02-24 VITALS — BP 160/110 | HR 95 | Ht 66.0 in | Wt 244.6 lb

## 2015-02-24 DIAGNOSIS — Z9114 Patient's other noncompliance with medication regimen: Secondary | ICD-10-CM

## 2015-02-24 DIAGNOSIS — I10 Essential (primary) hypertension: Secondary | ICD-10-CM | POA: Diagnosis not present

## 2015-02-24 MED ORDER — TORSEMIDE 20 MG PO TABS: 20.0000 mg | ORAL_TABLET | Freq: Every day | ORAL | Status: DC

## 2015-02-24 MED ORDER — CARVEDILOL 12.5 MG PO TABS: ORAL_TABLET | ORAL | Status: DC

## 2015-02-24 MED ORDER — SPIRONOLACTONE 25 MG PO TABS: 25.0000 mg | ORAL_TABLET | Freq: Every day | ORAL | Status: DC

## 2015-02-24 MED ORDER — LISINOPRIL 20 MG PO TABS: 40.0000 mg | ORAL_TABLET | Freq: Every day | ORAL | Status: DC

## 2015-02-24 MED ORDER — AMLODIPINE BESYLATE 5 MG PO TABS: 10.0000 mg | ORAL_TABLET | Freq: Every day | ORAL | Status: DC

## 2015-02-24 NOTE — Progress Notes (Signed)
Cardiology Office Note   Date:  02/24/2015   ID:  Chelsey Santiago, DOB 1976-04-29, MRN [id number]  PCP:  Elyn Peers, MD  Cardiologist: needs to be established / Jory Sims, NP   Chief Complaint  Patient presents with  . Hypertension      History of Present Illness: Chelsey Santiago is a 39 y.o. female who presents for ongoing assessment and management of hypertension.  The patient states that she has hadlower extremity edema, headaches, and has been feeling bad since being seen last.  She is followed by her primary care physician, Dr. Criss Rosales, but has not been taking her medications for the last 2 weeks as she ran out.  She has not been taking her hydralazine 3 times a day, either.  She comes today, hypertensive, with a blood pressure 160/110.  Recheck in the office is 173 / 115.  She also continues to eat salty foods and fast foods.   She is asking me also to help her to fill out a disability form.  Past Medical History  Diagnosis Date  . Hypertension     Lab: Normal BMet except glucose of 118 in 09/2010  . Hypertensive heart disease 2009    Pulmonary edema postpartum; mild to moderate mitral regurgitation when hospitalized for CHF in 2009; Echocardiogram in 12/2009-no MR and normal EF; normal CXR in 09/2010  . Migraine headache   . Anemia     H&H of 10.6/33 and 07/2008 and 11.9/35 and 09/2010  . Osteoarthritis, knee 03/29/2011  . Sleep apnea   . Depression with anxiety   . Fasting hyperglycemia   . Obesity 04/16/2009  . CHF (congestive heart failure) ([place])   . Pregnant   . Diabetes mellitus without complication ([place])   . Anxiety   . Enlarged heart   . Pulmonary edema   . Preeclampsia   . Threatened abortion in early pregnancy 03/15/2013  . Miscarriage 03/19/2013    Past Surgical History  Procedure Laterality Date  . Breast reduction surgery  2002  . Cesarean section N/A 04/09/2014    Procedure: CESAREAN SECTION;  Surgeon: Mora Bellman, MD;  Location: [facility];   Service: Obstetrics;  Laterality: N/A;     Current Outpatient Prescriptions  Medication Sig Dispense Refill  . amLODipine (NORVASC) 5 MG tablet Take 2 tablets (10 mg total) by mouth daily. 60 tablet 3  . carvedilol (COREG) 12.5 MG tablet TAKE 3 TABLETS BY MOUTH TWICE DAILY WITH A MEAL. 90 tablet 3  . hydrALAZINE (APRESOLINE) 50 MG tablet Take 1 tablet (50 mg total) by mouth 3 (three) times daily. 90 tablet 11  . lisinopril (PRINIVIL,ZESTRIL) 20 MG tablet Take 2 tablets (40 mg total) by mouth daily. 60 tablet 3  . metFORMIN (GLUCOPHAGE) 500 MG tablet Take by mouth 2 (two) times daily with a meal.    . potassium chloride SA (K-DUR,KLOR-CON) 20 MEQ tablet Take 2 tablets (40 mEq total) by mouth daily. 30 tablet 2  . spironolactone (ALDACTONE) 25 MG tablet Take 1 tablet (25 mg total) by mouth daily. 30 tablet 3  . torsemide (DEMADEX) 20 MG tablet Take 1 tablet (20 mg total) by mouth daily. 30 tablet 3   No current facility-administered medications for this visit.    Allergies:   Diclofenac; Tramadol; and Vicodin    Social History:  The patient  reports that she has never smoked. She has never used smokeless tobacco. She reports that she does not drink alcohol or use illicit drugs.  Family History:  The patient's family history includes Diabetes in her mother; Heart disease in her mother; Hyperlipidemia in her paternal grandfather; Hypertension in her maternal uncle and paternal grandfather. There is no history of Sudden death or Heart attack.    ROS: All other systems are reviewed and negative. Unless otherwise mentioned in H&P    PHYSICAL EXAM: VS:  BP 160/110 mmHg  Pulse 95  Ht 5\' 6"  (1.676 m)  Wt 244 lb 9.6 oz (110.95 kg)  BMI 39.50 kg/m2  SpO2 97% , BMI Body mass index is 39.5 kg/(m^2). GEN: Well nourished, well developed, in no acute distress HEENT: normal Neck: no JVD, carotid bruits, or masses Cardiac:RRR; S4 murmur, no murmurs, rubs, or gallops,no edema  Respiratory:   clear to auscultation bilaterally, normal work of breathing GI: soft, nontender, nondistended, + BS MS: no deformity or atrophy Skin: warm and dry, no rash Neuro:  Strength and sensation are intact Psych: euthymic mood, full affect   Recent Labs: 04/22/2014: ALT 24; Pro B Natriuretic peptide (BNP) 2380.0*; TSH 1.110 04/29/2014: BUN 18; Creatinine, Ser 0.99; Hemoglobin 9.6*; Platelets 342; Potassium 4.0; Sodium 141 04/30/2014: Magnesium 2.3    Lipid Panel    Component Value Date/Time   CHOL 154 11/20/2004   TRIG 225 11/20/2004   HDL 38 11/20/2004   LDLCALC 71 11/20/2004      Wt Readings from Last 3 Encounters:  02/24/15 244 lb 9.6 oz (110.95 kg)  11/05/14 241 lb 9.6 oz (109.589 kg)  05/14/14 217 lb 6.4 oz (98.612 kg)    ASSESSMENT AND PLAN:  1. Hypertension: blood pressure is uncontrolled, and she has not been compliant with her medication regimen.  She is not taking hydralazine 3 times a day as directed only twice a day.  She has run out of of her lisinopril and amlodipine.  She is also not taking her spironolactone.  I have given her refills on amlodipine, carvedilol 12.5 mg 3 tablets twice a day, hydralazine, lisinopril, spironolactone and torsemide I have counseled her on low sodium diet and medical adherence.  She verbalizes understanding.  I will have her come back in a week for a blood pressure check on the medications as directed.  2. Obstructive sleep apnea:she has been nonadherent with CPAP.  3. Depression:she has been referred for psychiatric therapy, but has not gone to appointments.  I have given her a phone number for a [facility].   4. Obesity: She is requesting a dietitian consult.  She was sent to [facility], but was too expensive for her so.  She did not go.  I referred her to the health department.   Current medicines are reviewed at length with the patient today.    Labs/ tests ordered today include: No orders of the defined types were placed in this  encounter.     Disposition:   FU with 1 await for blood pressure check, 1 month for overall evaluation.  Signed, Jory Sims, NP  02/24/2015 4:26 PM    [facility] [address], [place], [place] [address] Phone: [phone]; Fax: [phone]

## 2015-02-24 NOTE — Progress Notes (Deleted)
Name: Chelsey Santiago    DOB: 1975-08-27  Age: 39 y.o.  MR#: [id number]       PCP:  Elyn Peers, MD      Insurance: Payor: MEDICAID [place] / Plan: MEDICAID  ACCESS / Product Type: *No Product type* /   CC:   No chief complaint on file.   VS Filed Vitals:   02/24/15 1544  BP: 160/110  Pulse: 95  Height: 5\' 6"  (1.676 m)  Weight: 244 lb 9.6 oz (110.95 kg)  SpO2: 97%    Weights Current Weight  02/24/15 244 lb 9.6 oz (110.95 kg)  11/05/14 241 lb 9.6 oz (109.589 kg)  05/14/14 217 lb 6.4 oz (98.612 kg)    Blood Pressure  BP Readings from Last 3 Encounters:  02/24/15 160/110  11/05/14 160/100  05/14/14 158/90     Admit date:  (Not on file) Last encounter with RMR:  01/29/2015   Allergy Diclofenac; Tramadol; and Vicodin  Current Outpatient Prescriptions  Medication Sig Dispense Refill  . amLODipine (NORVASC) 5 MG tablet TAKE 2 TABLETS BY MOUTH DAILY. 30 tablet 0  . carvedilol (COREG) 12.5 MG tablet TAKE 3 TABLETS BY MOUTH TWICE DAILY WITH A MEAL. 60 tablet 0  . hydrALAZINE (APRESOLINE) 50 MG tablet Take 1 tablet (50 mg total) by mouth 3 (three) times daily. 90 tablet 11  . lisinopril (PRINIVIL,ZESTRIL) 20 MG tablet Take 2 tablets (40 mg total) by mouth daily. 30 tablet 2  . metFORMIN (GLUCOPHAGE) 500 MG tablet Take by mouth 2 (two) times daily with a meal.    . potassium chloride SA (K-DUR,KLOR-CON) 20 MEQ tablet Take 2 tablets (40 mEq total) by mouth daily. 30 tablet 2  . spironolactone (ALDACTONE) 25 MG tablet Take 1 tablet (25 mg total) by mouth daily. 30 tablet 2  . torsemide (DEMADEX) 20 MG tablet Take 1 tablet (20 mg total) by mouth daily. 60 tablet 1   No current facility-administered medications for this visit.    Discontinued Meds:   There are no discontinued medications.  Patient Active Problem List   Diagnosis Date Noted  . LLQ pain   . DM type 2 (diabetes mellitus, type 2) ([place]) 04/28/2014  . Essential hypertension   . Resistant hypertension 04/23/2014   . Hypertensive urgency 04/22/2014  . Acute CHF ([place]) 04/22/2014  . S/P cesarean section 04/11/2014  . Pulmonary edema 04/11/2014  . Postoperative anemia 04/11/2014  . Elevated serum creatinine 04/11/2014  . Preeclampsia, severe 04/09/2014  . Pre-eclampsia superimposed on chronic hypertension, antepartum 04/08/2014  . Dyspnea 04/08/2014  . Polyhydramnios in third trimester, antepartum 03/14/2014  . Abnormal maternal glucose tolerance, antepartum 03/11/2014  . High-risk pregnancy 03/11/2014  . Chronic hypertension in pregnancy 03/11/2014  . Impaired glucose tolerance during pregnancy, antepartum 11/27/2013  . Fibroids 11/22/2013  . History of gestational diabetes in prior pregnancy, currently pregnant in first trimester 11/22/2013  . Hx of preeclampsia, prior pregnancy, currently pregnant 11/22/2013  . Short interval between pregnancies affecting pregnancy, antepartum 11/22/2013  . Supervision of high-risk pregnancy of elderly primigravida (>= 48 years old at delivery), third trimester 11/22/2013  . Miscarriage 03/19/2013  . Major depression ([place]) 09/27/2011  . Hypertension   . Hypertensive heart disease   . Microcytic anemia   . Osteoarthritis, knee 03/29/2011  . SLEEP APNEA 12/09/2009  . Obesity 04/16/2009    LABS    Component Value Date/Time   NA 141 04/29/2014 0603   NA 139 04/28/2014 0555   NA 141 04/27/2014 2637  K 4.0 04/29/2014 0603   K 4.1 04/28/2014 0555   K 4.6 04/27/2014 0624   CL 102 04/29/2014 0603   CL 98 04/28/2014 0555   CL 101 04/27/2014 0624   CO2 28 04/29/2014 0603   CO2 29 04/28/2014 0555   CO2 29 04/27/2014 0624   GLUCOSE 117* 04/29/2014 0603   GLUCOSE 108* 04/28/2014 0555   GLUCOSE 109* 04/27/2014 0624   BUN 18 04/29/2014 0603   BUN 15 04/28/2014 0555   BUN 17 04/27/2014 0624   CREATININE 0.99 04/29/2014 0603   CREATININE 0.97 04/28/2014 0555   CREATININE 0.93 04/27/2014 0624   CREATININE 0.50 10/18/2013 0942   CREATININE 0.76 07/12/2011  1217   CREATININE 0.84 09/16/2010 1412   CALCIUM 8.9 04/29/2014 0603   CALCIUM 9.1 04/28/2014 0555   CALCIUM 9.1 04/27/2014 0624   GFRNONAA 71* 04/29/2014 0603   GFRNONAA 73* 04/28/2014 0555   GFRNONAA 77* 04/27/2014 0624   GFRAA 83* 04/29/2014 0603   GFRAA 85* 04/28/2014 0555   GFRAA 89* 04/27/2014 0624   CMP     Component Value Date/Time   NA 141 04/29/2014 0603   K 4.0 04/29/2014 0603   CL 102 04/29/2014 0603   CO2 28 04/29/2014 0603   GLUCOSE 117* 04/29/2014 0603   BUN 18 04/29/2014 0603   CREATININE 0.99 04/29/2014 0603   CREATININE 0.50 10/18/2013 0942   CALCIUM 8.9 04/29/2014 0603   PROT 6.2 04/22/2014 1624   ALBUMIN 2.3* 04/22/2014 1624   AST 23 04/22/2014 1624   ALT 24 04/22/2014 1624   ALKPHOS 102 04/22/2014 1624   BILITOT 0.2* 04/22/2014 1624   GFRNONAA 71* 04/29/2014 0603   GFRAA 83* 04/29/2014 0603       Component Value Date/Time   WBC 8.4 04/29/2014 0603   WBC 11.0* 04/28/2014 0553   WBC 11.5* 04/27/2014 1619   HGB 9.6* 04/29/2014 0603   HGB 10.1* 04/28/2014 0553   HGB 10.6* 04/27/2014 1619   HGB 9.7* 10/18/2012 1332   HCT 30.9* 04/29/2014 0603   HCT 32.8* 04/28/2014 0553   HCT 34.0* 04/27/2014 1619   MCV 73.2* 04/29/2014 0603   MCV 73.4* 04/28/2014 0553   MCV 73.6* 04/27/2014 1619    Lipid Panel     Component Value Date/Time   CHOL 154 11/20/2004   TRIG 225 11/20/2004   HDL 38 11/20/2004   LDLCALC 71 11/20/2004    ABG    Component Value Date/Time   TCO2 28 09/21/2010 1314     Lab Results  Component Value Date   TSH 1.110 04/22/2014   BNP (last 3 results) No results for input(s): BNP in the last 8760 hours.  ProBNP (last 3 results)  Recent Labs  04/08/14 1545 04/22/14 1624  PROBNP 1854.0* 2380.0*    Cardiac Panel (last 3 results) No results for input(s): CKTOTAL, CKMB, TROPONINI, RELINDX in the last 72 hours.  Iron/TIBC/Ferritin/ %Sat No results found for: IRON, TIBC, FERRITIN, IRONPCTSAT   EKG Orders placed or  performed during the hospital encounter of 04/22/14  . ED EKG  . ED EKG  . ED EKG  . ED EKG  . EKG 12-Lead  . EKG 12-Lead  . EKG     Prior Assessment and Plan Problem List as of 02/24/2015      Cardiovascular and Mediastinum   Hypertension   Last Assessment & Plan 08/04/2012 Routine Prenatal Written 08/04/2012 11:19 AM by Florian Buff, MD    Labetalol 400 BID  Hypertensive heart disease   Last Assessment & Plan 05/23/2012 Office Visit Written 05/23/2012  2:07 PM by Lendon Colonel, NP    Her BP is not well controlled. I have retaken the BP manually in the exam room and found it to be 162./100 with HR of 98.  I will increase labetalol to 400 mg BID and have asked her not to take the [employer]y Powders during her pregnancy. She will continue procardia 90- mg as directed. She will follow up with Dr.Rothbart in 2 months for carefull monitoring of her BP during pregnancy to avoid CHF or hypertensive urgency.      Chronic hypertension in pregnancy   Pre-eclampsia superimposed on chronic hypertension, antepartum   Preeclampsia, severe   Hypertensive urgency   Acute CHF [facility])   Last Assessment & Plan 05/14/2014 Office Visit Written 05/14/2014  4:29 PM by Lendon Colonel, NP    She is doing very well. She is still losing wt and watching salt. She denies edema or DOE. She is medically compliant. Will continue her on curent regimen. I will request labs from [facility] to evaluate kidney fx status. She will be seen again in 3 months unless symptomatic. She is congratulated on her progress.       Resistant hypertension   Essential hypertension   Last Assessment & Plan 05/14/2014 Office Visit Written 05/14/2014  4:31 PM by Lendon Colonel, NP    Better controlled. Rechecked in exam room. 142/76. She will continue current medication regimen. Watch closely as she continues to lose weight. See again in 3 months.        Respiratory   Pulmonary edema     Endocrine   Impaired glucose  tolerance during pregnancy, antepartum   Abnormal maternal glucose tolerance, antepartum   DM type 2 (diabetes mellitus, type 2) ([facility])     Musculoskeletal and Integument   Osteoarthritis, knee   Last Assessment & Plan 07/12/2011 Office Visit Written 07/12/2011  1:32 PM by Yehuda Savannah, MD    Patient has been told of severe degenerative joint disease of both knees.  Treatment has been difficult either due to lack of efficacy or adverse effects.  Injections with hyaluronic acid have not been tried, and/or an alternative therapeutic consideration.  She also has plans to be seen in a local pain clinic.  She has been told that medically she requires TKA, but is too young to reasonably undergo bilateral procedures.        Genitourinary   Fibroids     Other   Obesity   Last Assessment & Plan 05/14/2014 Office Visit Written 05/14/2014  4:33 PM by Lendon Colonel, NP    She has lost approx 70 lbs from recent hospitalization and diureses and cholecystectomy. She states that she is watching salt and isn't hungry. I have congratulated her on her weight loss and have encouraged her to maintain healthy lifestyle.       SLEEP APNEA   Microcytic anemia   Last Assessment & Plan 07/12/2011 Office Visit Written 07/12/2011  1:25 PM by Yehuda Savannah, MD    Mild anemia in 2010 had virtually resolved by 09/2010; CBC will be repeated.      Major depression [facility])   Last Assessment & Plan 09/27/2011 Office Visit Written 09/28/2011  1:27 PM by Carolin Guernsey, MD    Poorly controlled per patient, but she has follow-up with [facility] regarding this.       Miscarriage   History of  gestational diabetes in prior pregnancy, currently pregnant in first trimester   Hx of preeclampsia, prior pregnancy, currently pregnant   Short interval between pregnancies affecting pregnancy, antepartum   Supervision of high-risk pregnancy of elderly primigravida (>= 31 years old at delivery), third trimester   High-risk  pregnancy   Polyhydramnios in third trimester, antepartum   Dyspnea   S/P cesarean section   Postoperative anemia   Elevated serum creatinine   LLQ pain       Imaging: No results found.

## 2015-02-24 NOTE — Patient Instructions (Signed)
Your physician recommends that you schedule a follow-up appointment in: 1 week for BP check  Your physician recommends that you schedule a follow-up appointment in: 1 month with Jory Sims NP      Your medication has been refilled      Thank you for choosing [facility] !

## 2015-02-24 NOTE — Progress Notes (Signed)
Cardiology Office Note   Date:  02/24/2015   ID:  Chelsey Santiago, DOB 1976-01-04, MRN [id number]  PCP:  No PCP Per Patient  Cardiologist: To be est/  Jory Sims, NP   No chief complaint on file.   No SHOW

## 2015-02-25 ENCOUNTER — Ambulatory Visit: Payer: Medicaid Other | Admitting: Physician Assistant

## 2015-02-26 ENCOUNTER — Ambulatory Visit (HOSPITAL_COMMUNITY): Payer: Medicaid Other

## 2015-02-28 ENCOUNTER — Ambulatory Visit (HOSPITAL_COMMUNITY): Payer: Medicaid Other

## 2015-03-03 ENCOUNTER — Ambulatory Visit (HOSPITAL_COMMUNITY): Payer: Medicaid Other

## 2015-03-04 ENCOUNTER — Ambulatory Visit: Payer: Medicaid Other | Attending: Family Medicine | Admitting: Physical Therapy

## 2015-03-04 DIAGNOSIS — M24669 Ankylosis, unspecified knee: Secondary | ICD-10-CM | POA: Insufficient documentation

## 2015-03-04 DIAGNOSIS — R269 Unspecified abnormalities of gait and mobility: Secondary | ICD-10-CM | POA: Insufficient documentation

## 2015-03-04 DIAGNOSIS — M25561 Pain in right knee: Secondary | ICD-10-CM | POA: Diagnosis not present

## 2015-03-04 DIAGNOSIS — R29898 Other symptoms and signs involving the musculoskeletal system: Secondary | ICD-10-CM | POA: Diagnosis present

## 2015-03-04 DIAGNOSIS — M25562 Pain in left knee: Secondary | ICD-10-CM | POA: Insufficient documentation

## 2015-03-04 DIAGNOSIS — M25669 Stiffness of unspecified knee, not elsewhere classified: Secondary | ICD-10-CM

## 2015-03-04 NOTE — Therapy (Signed)
[place], [place], [address] Phone: [phone]   Fax:  [phone]  Physical Therapy Evaluation  Patient Details  Name: Chelsey Santiago MRN: [id number] Date of Birth: 01/24/1976 Referring Provider: Dr. Simonne Maffucci  Encounter Date: 03/04/2015      PT End of Session - 03/04/15 1111    Visit Number 1   Number of Visits 1   Date for PT Re-Evaluation 03/05/15   Authorization Type Medicaid   Authorization - Visit Number 1   Authorization - Number of Visits 1   PT Start Time 1015   PT Stop Time 1105   PT Time Calculation (min) 50 min   Activity Tolerance Patient tolerated treatment well   Behavior During Therapy [facility] for tasks assessed/performed      Past Medical History  Diagnosis Date  . Hypertension     Lab: Normal BMet except glucose of 118 in 09/2010  . Hypertensive heart disease 2009    Pulmonary edema postpartum; mild to moderate mitral regurgitation when hospitalized for CHF in 2009; Echocardiogram in 12/2009-no MR and normal EF; normal CXR in 09/2010  . Migraine headache   . Anemia     H&H of 10.6/33 and 07/2008 and 11.9/35 and 09/2010  . Osteoarthritis, knee 03/29/2011  . Sleep apnea   . Depression with anxiety   . Fasting hyperglycemia   . Obesity 04/16/2009  . CHF (congestive heart failure) ([place])   . Pregnant   . Diabetes mellitus without complication ([place])   . Anxiety   . Enlarged heart   . Pulmonary edema   . Preeclampsia   . Threatened abortion in early pregnancy 03/15/2013  . Miscarriage 03/19/2013    Past Surgical History  Procedure Laterality Date  . Breast reduction surgery  2002  . Cesarean section N/A 04/09/2014    Procedure: CESAREAN SECTION;  Surgeon: Mora Bellman, MD;  Location: [facility];  Service: Obstetrics;  Laterality: N/A;    There were no vitals filed for this visit.  Visit Diagnosis:  Bilateral knee pain - Plan: PT plan of care cert/re-cert  Bilateral  leg weakness - Plan: PT plan of care cert/re-cert  Decreased range of motion (ROM) of knee - Plan: PT plan of care cert/re-cert  Abnormality of gait - Plan: PT plan of care cert/re-cert      Subjective Assessment - 03/04/15 1024    Subjective pt is a 39 y.o F with CC of bil knee pain that been going on for a couple of years. pt reports thinking it is hereditary due to familiy members having TKAs. She reports that the pain has been getting worse with buckling and unsteady especially walking down hill.     Limitations Standing;Walking;House hold activities   How long can you sit comfortably? 15 min   How long can you stand comfortably? 15 min   How long can you walk comfortably? 30 min   Diagnostic tests per pt reports x-ray 2 weeks found arthritic changes   Patient Stated Goals to be pain free, and to get strength back   Currently in Pain? Yes   Pain Score 9    Pain Location Knee   Pain Orientation Right;Left  R>L   Pain Descriptors / Indicators Aching;Tightness;Sore;Pins and needles   Pain Type Chronic pain   Pain Onset More than a month ago   Pain Frequency Constant   Aggravating Factors  walking standing, stairs, prolonged sitting, and getting up from sitting   Pain Relieving  Factors ice/heat nothing really seems to help            [facility] PT Assessment - 03/04/15 1033    Assessment   Medical Diagnosis bilateral knee pain   Referring Provider Dr. Simonne Maffucci   Onset Date/Surgical Date --  couple of years   Hand Dominance Right   Next MD Visit 03/04/2015   Prior Therapy no   Precautions   Precautions None   Restrictions   Weight Bearing Restrictions No   Balance Screen   Has the patient fallen in the past 6 months No   Has the patient had a decrease in activity level because of a fear of falling?  No   Is the patient reluctant to leave their home because of a fear of falling?  No   Home Environment   Living Environment Private residence   Living Arrangements  Spouse/significant other;Children   Available Help at Discharge Available 24 hours/day;Available PRN/intermittently   Type of Home House   Home Access Stairs to enter   Entrance Stairs-Number of Steps 3   Entrance Stairs-Rails Can reach both   Home Layout One level   Prior Function   Level of [place] Independent;Independent with basic ADLs   Vocation Unemployed  trying to get disability   Leisure taking kids to park, family activities   Cognition   Overall Cognitive Status Within Functional Limits for tasks assessed   Posture/Postural Control   Posture/Postural Control Postural limitations   Postural Limitations Rounded Shoulders;Forward head   ROM / Strength   AROM / PROM / Strength PROM;AROM;Strength   AROM   AROM Assessment Site Knee   Right/Left Knee Right;Left   Right Knee Extension -5   Right Knee Flexion 120   Left Knee Extension -5   Left Knee Flexion 120   PROM   PROM Assessment Site Knee   Right/Left Knee Right;Left   Right Knee Extension 0   Right Knee Flexion 124   Left Knee Extension 0   Left Knee Flexion 124   Strength   Strength Assessment Site Hip;Knee   Right/Left Hip Right;Left   Right Hip Flexion 4/5   Right Hip Extension 4/5   Right Hip ABduction 4-/5   Right Hip ADduction 4-/5   Left Hip Flexion 4/5   Left Hip Extension 4/5   Left Hip ABduction 4-/5   Left Hip ADduction 4-/5   Right/Left Knee Right;Left   Right Knee Flexion 4/5  pain during testing   Right Knee Extension 4/5  pain during testing   Left Knee Flexion 4/5  pain during testing   Left Knee Extension 4/5  pain during testing   Palpation   Palpation comment tenderness located in the medial aspect of the R and left knee with tendnerness in the back of the knee   Special Tests    Special Tests Meniscus Tests   Meniscus Tests McMurray Test;other;Apley's Distraction;Apley's Compression   McMurray Test   Findings Positive   Side --  bil with R>L   Apley's Compression    Findings Positive   Side  --  bil   Apley's Distraction   Findings Positive   Side --  bil   other   Findings Positive   Side --  bil   Comments Thessaly test   Ambulation/Gait   Ambulation/Gait Yes   Gait Pattern Step-through pattern;Antalgic   Ambulation Surface Level  PT Education - 03/04/15 1111    Education provided Yes   Education Details evaluation findings, knee anatomy education, HEP   Person(s) Educated Patient   Methods Explanation   Comprehension Verbalized understanding                    Plan - 03/04/15 1112    Clinical Impression Statement Chelsey Santiago presents to OPPT with CC of bil knee pain with R>L that has been present for a couple of years. She demonstrates limited knee AROM/PROM with pain noted at end of her available ROM. MMT reavealed weakness with pain during testing of the knees and weakness in bil hip muscles. She currently ambulates with a antalgic gait pattern with limited step length bil. Special testing was postivie for Mcmurrays, apleys and thessaly which in combination of subjective findings of popping, clicking and buckling indicates high probably meniscal pathology. Provided HEP for knee and hip stretching and strengthening., and handout for H[facility] clinic at [facility].    Pt will benefit from skilled therapeutic intervention in order to improve on the following deficits Abnormal gait;Decreased balance;Decreased activity tolerance;Decreased strength;Increased edema;Impaired flexibility;Pain;Improper body mechanics;Postural dysfunction;Hypomobility   PT Frequency --  1 visit onlhy   PT Next Visit Plan Medicaid   PT Home Exercise Plan See HEP handout   Consulted and Agree with Plan of Care Patient         Problem List Patient Active Problem List   Diagnosis Date Noted  . LLQ pain   . DM type 2 (diabetes mellitus, type 2) ([place]) 04/28/2014  . Essential hypertension   . Resistant hypertension  04/23/2014  . Hypertensive urgency 04/22/2014  . Acute CHF ([place]) 04/22/2014  . S/P cesarean section 04/11/2014  . Pulmonary edema 04/11/2014  . Postoperative anemia 04/11/2014  . Elevated serum creatinine 04/11/2014  . Preeclampsia, severe 04/09/2014  . Pre-eclampsia superimposed on chronic hypertension, antepartum 04/08/2014  . Dyspnea 04/08/2014  . Polyhydramnios in third trimester, antepartum 03/14/2014  . Abnormal maternal glucose tolerance, antepartum 03/11/2014  . High-risk pregnancy 03/11/2014  . Chronic hypertension in pregnancy 03/11/2014  . Impaired glucose tolerance during pregnancy, antepartum 11/27/2013  . Fibroids 11/22/2013  . History of gestational diabetes in prior pregnancy, currently pregnant in first trimester 11/22/2013  . Hx of preeclampsia, prior pregnancy, currently pregnant 11/22/2013  . Short interval between pregnancies affecting pregnancy, antepartum 11/22/2013  . Supervision of high-risk pregnancy of elderly primigravida (>= 83 years old at delivery), third trimester 11/22/2013  . Miscarriage 03/19/2013  . Major depression ([place]) 09/27/2011  . Hypertension   . Hypertensive heart disease   . Microcytic anemia   . Osteoarthritis, knee 03/29/2011  . SLEEP APNEA 12/09/2009  . Obesity 04/16/2009   Starr Lake PT, DPT, LAT, ATC  03/04/2015  11:23 AM   [facility] [address] [place], [place], [address] Phone: [phone]   Fax:  [phone]  Name: Chelsey Santiago MRN: [id number] Date of Birth: [birth year]

## 2015-03-04 NOTE — Patient Instructions (Signed)
   Kristoffer Leamon PT, DPT, LAT, ATC  [facility] Phone: [phone]     

## 2015-03-05 ENCOUNTER — Ambulatory Visit (HOSPITAL_COMMUNITY): Payer: Medicaid Other

## 2015-03-07 ENCOUNTER — Ambulatory Visit (HOSPITAL_COMMUNITY): Payer: Medicaid Other

## 2015-03-10 ENCOUNTER — Ambulatory Visit (HOSPITAL_COMMUNITY): Payer: Medicaid Other

## 2015-03-12 ENCOUNTER — Ambulatory Visit (HOSPITAL_COMMUNITY): Payer: Medicaid Other

## 2015-03-13 ENCOUNTER — Ambulatory Visit (INDEPENDENT_AMBULATORY_CARE_PROVIDER_SITE_OTHER): Payer: Medicaid Other

## 2015-03-13 VITALS — BP 130/82 | HR 98 | Wt 244.2 lb

## 2015-03-13 DIAGNOSIS — I10 Essential (primary) hypertension: Secondary | ICD-10-CM

## 2015-03-13 NOTE — Progress Notes (Signed)
Pt came in for bp check- it was 130/82. No complaints.  Will forward to [employer]

## 2015-03-14 ENCOUNTER — Ambulatory Visit (HOSPITAL_COMMUNITY): Payer: Medicaid Other

## 2015-03-17 ENCOUNTER — Ambulatory Visit (HOSPITAL_COMMUNITY): Payer: Medicaid Other

## 2015-03-19 ENCOUNTER — Ambulatory Visit (HOSPITAL_COMMUNITY): Payer: Medicaid Other

## 2015-03-21 ENCOUNTER — Ambulatory Visit (HOSPITAL_COMMUNITY): Payer: Medicaid Other

## 2015-03-24 ENCOUNTER — Ambulatory Visit (HOSPITAL_COMMUNITY): Payer: Medicaid Other

## 2015-03-26 ENCOUNTER — Ambulatory Visit (HOSPITAL_COMMUNITY): Payer: Medicaid Other

## 2015-03-27 ENCOUNTER — Encounter: Payer: Self-pay | Admitting: Adult Health

## 2015-03-27 ENCOUNTER — Ambulatory Visit (INDEPENDENT_AMBULATORY_CARE_PROVIDER_SITE_OTHER): Payer: Medicaid Other | Admitting: Adult Health

## 2015-03-27 VITALS — BP 154/102 | HR 107 | Wt 241.2 lb

## 2015-03-27 DIAGNOSIS — I10 Essential (primary) hypertension: Secondary | ICD-10-CM

## 2015-03-27 DIAGNOSIS — Z9119 Patient's noncompliance with other medical treatment and regimen: Secondary | ICD-10-CM

## 2015-03-27 DIAGNOSIS — R000 Tachycardia, unspecified: Secondary | ICD-10-CM

## 2015-03-27 DIAGNOSIS — Z91199 Patient's noncompliance with other medical treatment and regimen due to unspecified reason: Secondary | ICD-10-CM

## 2015-03-27 MED ORDER — POTASSIUM CHLORIDE CRYS ER 20 MEQ PO TBCR
40.0000 meq | EXTENDED_RELEASE_TABLET | Freq: Every day | ORAL | Status: DC
Start: 2015-03-27 — End: 2016-04-09

## 2015-03-27 NOTE — Patient Instructions (Addendum)
Your physician recommends that you schedule a follow-up appointment in: 3 months with Arnold Long NP  STOP caffeine and salt   Take an extra Coreg when you get home  Have lab work :bmet just before next follow up    If you need a refill on your cardiac medications before your next appointment, please call your pharmacy.    Thank you for choosing [facility] !

## 2015-03-27 NOTE — Progress Notes (Deleted)
Name: Chelsey Santiago    DOB: 1975-11-01  Age: 39 y.o.  MR#: [id number]       PCP:  Elyn Peers, MD      Insurance: Payor: MEDICAID [place] / Plan: MEDICAID [place] ACCESS / Product Type: *No Product type* /   CC:   No chief complaint on file.   VS Filed Vitals:   03/27/15 1304  BP: 154/102  Pulse: 107  Weight: 241 lb 3.2 oz (109.408 kg)  SpO2: 97%    Weights Current Weight  03/27/15 241 lb 3.2 oz (109.408 kg)  03/13/15 244 lb 3.2 oz (110.768 kg)  02/24/15 244 lb 9.6 oz (110.95 kg)    Blood Pressure  BP Readings from Last 3 Encounters:  03/27/15 154/102  03/13/15 130/82  02/24/15 160/110     Admit date:  (Not on file) Last encounter with RMR:  02/24/2015   Allergy Diclofenac; Tramadol; and Vicodin  Current Outpatient Prescriptions  Medication Sig Dispense Refill  . amLODipine (NORVASC) 5 MG tablet Take 2 tablets (10 mg total) by mouth daily. 60 tablet 3  . carvedilol (COREG) 12.5 MG tablet TAKE 3 TABLETS BY MOUTH TWICE DAILY WITH A MEAL. 90 tablet 3  . hydrALAZINE (APRESOLINE) 50 MG tablet Take 1 tablet (50 mg total) by mouth 3 (three) times daily. 90 tablet 11  . lisinopril (PRINIVIL,ZESTRIL) 20 MG tablet Take 2 tablets (40 mg total) by mouth daily. 60 tablet 3  . potassium chloride SA (K-DUR,KLOR-CON) 20 MEQ tablet Take 2 tablets (40 mEq total) by mouth daily. 30 tablet 2  . spironolactone (ALDACTONE) 25 MG tablet Take 1 tablet (25 mg total) by mouth daily. 30 tablet 3  . torsemide (DEMADEX) 20 MG tablet Take 1 tablet (20 mg total) by mouth daily. 30 tablet 3   No current facility-administered medications for this visit.    Discontinued Meds:    Medications Discontinued During This Encounter  Medication Reason  . metFORMIN (GLUCOPHAGE) 500 MG tablet Error    Patient Active Problem List   Diagnosis Date Noted  . LLQ pain   . DM type 2 (diabetes mellitus, type 2) ([place]) 04/28/2014  . Essential hypertension   . 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 Hypertensive  urgency 04/22/2014  . Acute CHF ([place]) 04/22/2014  . S/P cesarean section 04/11/2014  . Pulmonary edema 04/11/2014  . Postoperative anemia 04/11/2014  . Elevated serum creatinine 04/11/2014  . Preeclampsia, severe 04/09/2014  . Pre-eclampsia superimposed on chronic hypertension, antepartum 04/08/2014  . Dyspnea 04/08/2014  . Polyhydramnios in third trimester, antepartum 03/14/2014  . Abnormal maternal glucose tolerance, antepartum 03/11/2014  . High-risk pregnancy 03/11/2014  . Chronic hypertension in pregnancy 03/11/2014  . Impaired glucose tolerance during pregnancy, antepartum 11/27/2013  . Fibroids 11/22/2013  . History of gestational diabetes in prior pregnancy, currently pregnant in first trimester 11/22/2013  . Hx of preeclampsia, prior pregnancy, currently pregnant 11/22/2013  . Short interval between pregnancies affecting pregnancy, antepartum 11/22/2013  . Supervision of high-risk pregnancy of elderly primigravida (>= 21 years old at delivery), third trimester 11/22/2013  . Miscarriage 03/19/2013  . Major depression ([place]) 09/27/2011  . Hypertension   . Hypertensive heart disease   . Microcytic anemia   . Osteoarthritis, knee 03/29/2011  . SLEEP APNEA 12/09/2009  . Obesity 04/16/2009    LABS    Component Value Date/Time   NA 141 04/29/2014 0603   NA 139 04/28/2014 0555   NA 141 04/27/2014 0624   K 4.0 04/29/2014 0603   K 4.1  04/28/2014 0555   K 4.6 04/27/2014 0624   CL 102 04/29/2014 0603   CL 98 04/28/2014 0555   CL 101 04/27/2014 0624   CO2 28 04/29/2014 0603   CO2 29 04/28/2014 0555   CO2 29 04/27/2014 0624   GLUCOSE 117* 04/29/2014 0603   GLUCOSE 108* 04/28/2014 0555   GLUCOSE 109* 04/27/2014 0624   BUN 18 04/29/2014 0603   BUN 15 04/28/2014 0555   BUN 17 04/27/2014 0624   CREATININE 0.99 04/29/2014 0603   CREATININE 0.97 04/28/2014 0555   CREATININE 0.93 04/27/2014 0624   CREATININE 0.50 10/18/2013 0942   CREATININE 0.76 07/12/2011 1217   CREATININE  0.84 09/16/2010 1412   CALCIUM 8.9 04/29/2014 0603   CALCIUM 9.1 04/28/2014 0555   CALCIUM 9.1 04/27/2014 0624   GFRNONAA 71* 04/29/2014 0603   GFRNONAA 73* 04/28/2014 0555   GFRNONAA 77* 04/27/2014 0624   GFRAA 83* 04/29/2014 0603   GFRAA 85* 04/28/2014 0555   GFRAA 89* 04/27/2014 0624   CMP     Component Value Date/Time   NA 141 04/29/2014 0603   K 4.0 04/29/2014 0603   CL 102 04/29/2014 0603   CO2 28 04/29/2014 0603   GLUCOSE 117* 04/29/2014 0603   BUN 18 04/29/2014 0603   CREATININE 0.99 04/29/2014 0603   CREATININE 0.50 10/18/2013 0942   CALCIUM 8.9 04/29/2014 0603   PROT 6.2 04/22/2014 1624   ALBUMIN 2.3* 04/22/2014 1624   AST 23 04/22/2014 1624   ALT 24 04/22/2014 1624   ALKPHOS 102 04/22/2014 1624   BILITOT 0.2* 04/22/2014 1624   GFRNONAA 71* 04/29/2014 0603   GFRAA 83* 04/29/2014 0603       Component Value Date/Time   WBC 8.4 04/29/2014 0603   WBC 11.0* 04/28/2014 0553   WBC 11.5* 04/27/2014 1619   HGB 9.6* 04/29/2014 0603   HGB 10.1* 04/28/2014 0553   HGB 10.6* 04/27/2014 1619   HGB 9.7* 10/18/2012 1332   HCT 30.9* 04/29/2014 0603   HCT 32.8* 04/28/2014 0553   HCT 34.0* 04/27/2014 1619   MCV 73.2* 04/29/2014 0603   MCV 73.4* 04/28/2014 0553   MCV 73.6* 04/27/2014 1619    Lipid Panel     Component Value Date/Time   CHOL 154 11/20/2004   TRIG 225 11/20/2004   HDL 38 11/20/2004   LDLCALC 71 11/20/2004    ABG    Component Value Date/Time   TCO2 28 09/21/2010 1314     Lab Results  Component Value Date   TSH 1.110 04/22/2014   BNP (last 3 results) No results for input(s): BNP in the last 8760 hours.  ProBNP (last 3 results)  Recent Labs  04/08/14 1545 04/22/14 1624  PROBNP 1854.0* 2380.0*    Cardiac Panel (last 3 results) No results for input(s): CKTOTAL, CKMB, TROPONINI, RELINDX in the last 72 hours.  Iron/TIBC/Ferritin/ %Sat No results found for: IRON, TIBC, FERRITIN, IRONPCTSAT   EKG Orders placed or performed during the  hospital encounter of 04/22/14  . ED EKG  . ED EKG  . ED EKG  . ED EKG  . EKG 12-Lead  . EKG 12-Lead  . EKG     Prior Assessment and Plan Problem List as of 03/27/2015      Cardiovascular and Mediastinum   Hypertension   Last Assessment & Plan 08/04/2012 Routine Prenatal Written 08/04/2012 11:19 AM by Florian Buff, MD    Labetalol 400 BID      Hypertensive heart disease   Last Assessment &  Plan 05/23/2012 Office Visit Written 05/23/2012  2:07 PM by Lendon Colonel, NP    Her BP is not well controlled. I have retaken the BP manually in the exam room and found it to be 162./100 with HR of 98.  I will increase labetalol to 400 mg BID and have asked her not to take the [employer] during her pregnancy. She will continue procardia 90- mg as directed. She will follow up with Dr.Rothbart in 2 months for carefull monitoring of her BP during pregnancy to avoid CHF or hypertensive urgency.      Chronic hypertension in pregnancy   Pre-eclampsia superimposed on chronic hypertension, antepartum   Preeclampsia, severe   Hypertensive urgency   Acute CHF [facility])   Last Assessment & Plan 05/14/2014 Office Visit Written 05/14/2014  4:29 PM by Lendon Colonel, NP    She is doing very well. She is still losing wt and watching salt. She denies edema or DOE. She is medically compliant. Will continue her on curent regimen. I will request labs from [facility] to evaluate kidney fx status. She will be seen again in 3 months unless symptomatic. She is congratulated on her progress.       Resistant hypertension   Essential hypertension   Last Assessment & Plan 05/14/2014 Office Visit Written 05/14/2014  4:31 PM by Lendon Colonel, NP    Better controlled. Rechecked in exam room. 142/76. She will continue current medication regimen. Watch closely as she continues to lose weight. See again in 3 months.        Respiratory   Pulmonary edema     Endocrine   Impaired glucose tolerance during pregnancy,  antepartum   Abnormal maternal glucose tolerance, antepartum   DM type 2 (diabetes mellitus, type 2) ([facility])     Musculoskeletal and Integument   Osteoarthritis, knee   Last Assessment & Plan 07/12/2011 Office Visit Written 07/12/2011  1:32 PM by Yehuda Savannah, MD    Patient has been told of severe degenerative joint disease of both knees.  Treatment has been difficult either due to lack of efficacy or adverse effects.  Injections with hyaluronic acid have not been tried, and/or an alternative therapeutic consideration.  She also has plans to be seen in a local pain clinic.  She has been told that medically she requires TKA, but is too young to reasonably undergo bilateral procedures.        Genitourinary   Fibroids     Other   Obesity   Last Assessment & Plan 05/14/2014 Office Visit Written 05/14/2014  4:33 PM by Lendon Colonel, NP    She has lost approx 70 lbs from recent hospitalization and diureses and cholecystectomy. She states that she is watching salt and isn't hungry. I have congratulated her on her weight loss and have encouraged her to maintain healthy lifestyle.       SLEEP APNEA   Microcytic anemia   Last Assessment & Plan 07/12/2011 Office Visit Written 07/12/2011  1:25 PM by Yehuda Savannah, MD    Mild anemia in 2010 had virtually resolved by 09/2010; CBC will be repeated.      Major depression [facility])   Last Assessment & Plan 09/27/2011 Office Visit Written 09/28/2011  1:27 PM by Carolin Guernsey, MD    Poorly controlled per patient, but she has follow-up with [facility] regarding this.       Miscarriage   History of gestational diabetes in prior pregnancy, currently pregnant in  first trimester   Hx of preeclampsia, prior pregnancy, currently pregnant   Short interval between pregnancies affecting pregnancy, antepartum   Supervision of high-risk pregnancy of elderly primigravida (>= 72 years old at delivery), third trimester   High-risk pregnancy   Polyhydramnios in  third trimester, antepartum   Dyspnea   S/P cesarean section   Postoperative anemia   Elevated serum creatinine   LLQ pain       Imaging: No results found.

## 2015-03-27 NOTE — Progress Notes (Signed)
Cardiology Office Note   Date:  03/27/2015   ID:  Chelsey Santiago, DOB 1975-08-28, MRN [id number]  PCP:  Elyn Peers, MD  Cardiologist:   Jory Sims, NP   No chief complaint on file.     History of Present Illness: Chelsey Santiago is a 39 y.o. female who presents for ongoing assessment and management of hypertension. She was reminded to take hydralazine TID as directed as she was not doing so. She had run out of her other antihypertensive medications lisinopril and amlodipine. She was given refills. She is here for follow up.    She comes today "streesed out" and hypertensive. She has eaten salty foods and is drinking a lot of [place]. She states that she is taking her medications as directed.   Past Medical History  Diagnosis Date  . Hypertension     Lab: Normal BMet except glucose of 118 in 09/2010  . Hypertensive heart disease 2009    Pulmonary edema postpartum; mild to moderate mitral regurgitation when hospitalized for CHF in 2009; Echocardiogram in 12/2009-no MR and normal EF; normal CXR in 09/2010  . Migraine headache   . Anemia     H&H of 10.6/33 and 07/2008 and 11.9/35 and 09/2010  . Osteoarthritis, knee 03/29/2011  . Sleep apnea   . Depression with anxiety   . Fasting hyperglycemia   . Obesity 04/16/2009  . CHF (congestive heart failure) ([place])   . Pregnant   . Diabetes mellitus without complication ([place])   . Anxiety   . Enlarged heart   . Pulmonary edema   . Preeclampsia   . Threatened abortion in early pregnancy 03/15/2013  . Miscarriage 03/19/2013    Past Surgical History  Procedure Laterality Date  . Breast reduction surgery  2002  . Cesarean section N/A 04/09/2014    Procedure: CESAREAN SECTION;  Surgeon: Mora Bellman, MD;  Location: [facility];  Service: Obstetrics;  Laterality: N/A;     Current Outpatient Prescriptions  Medication Sig Dispense Refill  . amLODipine (NORVASC) 5 MG tablet Take 2 tablets (10 mg total) by mouth daily. 60 tablet 3  .  carvedilol (COREG) 12.5 MG tablet TAKE 3 TABLETS BY MOUTH TWICE DAILY WITH A MEAL. 90 tablet 3  . hydrALAZINE (APRESOLINE) 50 MG tablet Take 1 tablet (50 mg total) by mouth 3 (three) times daily. 90 tablet 11  . lisinopril (PRINIVIL,ZESTRIL) 20 MG tablet Take 2 tablets (40 mg total) by mouth daily. 60 tablet 3  . potassium chloride SA (K-DUR,KLOR-CON) 20 MEQ tablet Take 2 tablets (40 mEq total) by mouth daily. 30 tablet 6  . spironolactone (ALDACTONE) 25 MG tablet Take 1 tablet (25 mg total) by mouth daily. 30 tablet 3  . torsemide (DEMADEX) 20 MG tablet Take 1 tablet (20 mg total) by mouth daily. 30 tablet 3   No current facility-administered medications for this visit.    Allergies:   Diclofenac; Tramadol; and Vicodin    Social History:  The patient  reports that she has never smoked. She has never used smokeless tobacco. She reports that she does not drink alcohol or use illicit drugs.   Family History:  The patient's family history includes Diabetes in her mother; Heart disease in her mother; Hyperlipidemia in her paternal grandfather; Hypertension in her maternal uncle and paternal grandfather. There is no history of Sudden death or Heart attack.    ROS: All other systems are reviewed and negative. Unless otherwise mentioned in H&P    PHYSICAL EXAM: VS:  BP 154/102 mmHg  Pulse 107  Wt 241 lb 3.2 oz (109.408 kg)  SpO2 97% , BMI Body mass index is 38.95 kg/(m^2). GEN: Well nourished, well developed, in no acute distress HEENT: normal Neck: no JVD, carotid bruits, or masses Cardiac: RRR; tachycardic,  no murmurs, rubs, or gallops,no edema  Respiratory:  clear to auscultation bilaterally, normal work of breathing GI: soft, nontender, nondistended, + BS MS: no deformity or atrophy Skin: warm and dry, no rash Neuro:  Strength and sensation are intact Psych: euthymic mood, full affect  Recent Labs: 04/22/2014: ALT 24; Pro B Natriuretic peptide (BNP) 2380.0*; TSH 1.110 04/29/2014:  BUN 18; Creatinine, Ser 0.99; Hemoglobin 9.6*; Platelets 342; Potassium 4.0; Sodium 141 04/30/2014: Magnesium 2.3    Lipid Panel    Component Value Date/Time   CHOL 154 11/20/2004   TRIG 225 11/20/2004   HDL 38 11/20/2004   LDLCALC 71 11/20/2004      Wt Readings from Last 3 Encounters:  03/27/15 241 lb 3.2 oz (109.408 kg)  03/13/15 244 lb 3.2 oz (110.768 kg)  02/24/15 244 lb 9.6 oz (110.95 kg)      ASSESSMENT AND PLAN:  1. Hypertension: Non-adherence to medical regimen is biggest factor. She states she is taking her medications, but has often run out and not refilled. She is eating salty foods despite numerous counseling on this. She is drinking a lot of [place].   I have again stressed the need to comply with our recommendations to keep her BP WNL and to prevent kidney damage and CVA. She verbalizes understanding.   2. Tachycardia: She is tachycardic today. She will need to take one 12.5 mg carvedilol when she gets home to decrease HR and BP.   3. Diabetes: She states that her blood sugar is not well controlled. She is being followed by PCP for this.   Current medicines are reviewed at length with the patient today.    Labs/ tests ordered today include: BMET  Orders Placed This Encounter  Procedures  . Basic Metabolic Panel (BMET)     Disposition:   FU with 4 months. With a BMET Jory Sims, NP  03/27/2015 1:32 PM    [facility]  [address] [place], [place] [address] Phone: [phone]; Fax: [phone]

## 2015-03-28 ENCOUNTER — Ambulatory Visit (HOSPITAL_COMMUNITY): Payer: Medicaid Other

## 2015-03-31 ENCOUNTER — Ambulatory Visit (HOSPITAL_COMMUNITY): Payer: Medicaid Other

## 2015-04-02 ENCOUNTER — Ambulatory Visit (HOSPITAL_COMMUNITY): Payer: Medicaid Other

## 2015-04-04 ENCOUNTER — Ambulatory Visit (HOSPITAL_COMMUNITY): Payer: Medicaid Other

## 2015-04-07 ENCOUNTER — Ambulatory Visit (HOSPITAL_COMMUNITY): Payer: Medicaid Other

## 2015-04-09 ENCOUNTER — Ambulatory Visit (HOSPITAL_COMMUNITY): Payer: Medicaid Other

## 2015-04-11 ENCOUNTER — Ambulatory Visit (HOSPITAL_COMMUNITY): Payer: Medicaid Other

## 2015-05-12 ENCOUNTER — Encounter (HOSPITAL_COMMUNITY): Payer: Self-pay | Admitting: *Deleted

## 2015-05-12 ENCOUNTER — Emergency Department (HOSPITAL_COMMUNITY): Payer: Medicaid Other

## 2015-05-12 ENCOUNTER — Emergency Department (HOSPITAL_COMMUNITY)
Admission: EM | Admit: 2015-05-12 | Discharge: 2015-05-12 | Disposition: A | Discharge status: Home / Self Care | Payer: Medicaid Other | Attending: Emergency Medicine | Admitting: Emergency Medicine

## 2015-05-12 DIAGNOSIS — G43909 Migraine, unspecified, not intractable, without status migrainosus: Secondary | ICD-10-CM | POA: Insufficient documentation

## 2015-05-12 DIAGNOSIS — Z8739 Personal history of other diseases of the musculoskeletal system and connective tissue: Secondary | ICD-10-CM | POA: Insufficient documentation

## 2015-05-12 DIAGNOSIS — Z3202 Encounter for pregnancy test, result negative: Secondary | ICD-10-CM | POA: Insufficient documentation

## 2015-05-12 DIAGNOSIS — I110 Hypertensive heart disease with heart failure: Secondary | ICD-10-CM | POA: Insufficient documentation

## 2015-05-12 DIAGNOSIS — M545 Low back pain, unspecified: Secondary | ICD-10-CM

## 2015-05-12 DIAGNOSIS — E119 Type 2 diabetes mellitus without complications: Secondary | ICD-10-CM | POA: Diagnosis not present

## 2015-05-12 DIAGNOSIS — Z862 Personal history of diseases of the blood and blood-forming organs and certain disorders involving the immune mechanism: Secondary | ICD-10-CM | POA: Diagnosis not present

## 2015-05-12 DIAGNOSIS — I509 Heart failure, unspecified: Secondary | ICD-10-CM | POA: Insufficient documentation

## 2015-05-12 DIAGNOSIS — E669 Obesity, unspecified: Secondary | ICD-10-CM | POA: Diagnosis not present

## 2015-05-12 DIAGNOSIS — Z79899 Other long term (current) drug therapy: Secondary | ICD-10-CM | POA: Insufficient documentation

## 2015-05-12 DIAGNOSIS — Z8659 Personal history of other mental and behavioral disorders: Secondary | ICD-10-CM | POA: Diagnosis not present

## 2015-05-12 LAB — URINALYSIS, ROUTINE W REFLEX MICROSCOPIC
BILIRUBIN URINE: NEGATIVE
Glucose, UA: >1000 mg/dL — AB
HGB URINE DIPSTICK: NEGATIVE
Leukocytes, UA: NEGATIVE
Nitrite: NEGATIVE
PH: 6 (ref 5.0–8.0)
Protein, ur: NEGATIVE mg/dL
SPECIFIC GRAVITY, URINE: 1.02 (ref 1.005–1.030)

## 2015-05-12 LAB — URINE MICROSCOPIC-ADD ON: WBC UA: NONE SEEN WBC/hpf (ref 0–5)

## 2015-05-12 LAB — PREGNANCY, URINE: Preg Test, Ur: NEGATIVE

## 2015-05-12 MED ORDER — OXYCODONE-ACETAMINOPHEN 5-325 MG PO TABS
1.0000 | ORAL_TABLET | Freq: Once | ORAL | Status: AC
  Administered 2015-05-12: 1 via ORAL
  Filled 2015-05-12: qty 1

## 2015-05-12 MED ORDER — METHOCARBAMOL 500 MG PO TABS: 500.0000 mg | ORAL_TABLET | Freq: Three times a day (TID) | ORAL | Status: DC

## 2015-05-12 MED ORDER — CYCLOBENZAPRINE HCL 10 MG PO TABS
10.0000 mg | ORAL_TABLET | Freq: Once | ORAL | Status: AC
  Administered 2015-05-12: 10 mg via ORAL
  Filled 2015-05-12: qty 1

## 2015-05-12 MED ORDER — OXYCODONE-ACETAMINOPHEN 5-325 MG PO TABS: 1.0000 | ORAL_TABLET | ORAL | Status: DC | PRN

## 2015-05-12 NOTE — Discharge Instructions (Signed)

## 2015-05-12 NOTE — ED Notes (Signed)
Pt comes in with back pain that started 4 weeks ago. Pt denies injury. Pt has been taking aleve at home with no relief.

## 2015-05-12 NOTE — ED Provider Notes (Signed)
CSN: [id number]     Arrival date & time 05/12/15  0940 History   First MD Initiated Contact with Patient 05/12/15 1031     Chief Complaint  Patient presents with  . Back Pain     (Consider location/radiation/quality/duration/timing/severity/associated sxs/prior Treatment) HPI    Chelsey Santiago is a 39 y.o. female who presents to the Emergency Department complaining of diffuse lower back pain for one month.  Pain has been increasing since onset.  She describes an aching pain across her back that is worse with movement, especially bending over.  She has tried Aleve without relief. She denies pain to her abdomen, urine or bowel changes, numbness, weakness or pain to her LE's, fever or chills.  She also denies trauma and pelvic pain   Past Medical History  Diagnosis Date  . Hypertension     Lab: Normal BMet except glucose of 118 in 09/2010  . Hypertensive heart disease 2009    Pulmonary edema postpartum; mild to moderate mitral regurgitation when hospitalized for CHF in 2009; Echocardiogram in 12/2009-no MR and normal EF; normal CXR in 09/2010  . Migraine headache   . Anemia     H&H of 10.6/33 and 07/2008 and 11.9/35 and 09/2010  . Osteoarthritis, knee 03/29/2011  . Sleep apnea   . Depression with anxiety   . Fasting hyperglycemia   . Obesity 04/16/2009  . CHF (congestive heart failure) ([place])   . Pregnant   . Diabetes mellitus without complication ([place])   . Anxiety   . Enlarged heart   . Pulmonary edema   . Preeclampsia   . Threatened abortion in early pregnancy 03/15/2013  . Miscarriage 03/19/2013   Past Surgical History  Procedure Laterality Date  . Breast reduction surgery  2002  . Cesarean section N/A 04/09/2014    Procedure: CESAREAN SECTION;  Surgeon: Mora Bellman, MD;  Location: [facility];  Service: Obstetrics;  Laterality: N/A;   Family History  Problem Relation Age of Onset  . Diabetes Mother   . Heart disease Mother   . Hypertension Maternal Uncle   .  Hyperlipidemia Paternal Grandfather   . Hypertension Paternal Grandfather   . Sudden death Neg Hx   . Heart attack Neg Hx    Social History  Substance Use Topics  . Smoking status: Never Smoker   . Smokeless tobacco: Never Used  . Alcohol Use: No     Comment: occ   OB History    Gravida Para Term Preterm AB TAB SAB Ectopic Multiple Living   11 6 5 1 5 2 3   0 6     Review of Systems  Constitutional: Negative for fever.  Respiratory: Negative for shortness of breath.   Gastrointestinal: Negative for vomiting, abdominal pain and constipation.  Genitourinary: Negative for dysuria, hematuria, flank pain, decreased urine volume and difficulty urinating.  Musculoskeletal: Positive for back pain. Negative for joint swelling.  Skin: Negative for rash.  Neurological: Negative for weakness and numbness.  All other systems reviewed and are negative.     Allergies  Diclofenac; Tramadol; and Vicodin  Home Medications   Prior to Admission medications   Medication Sig Start Date End Date Taking? Authorizing Provider  amLODipine (NORVASC) 5 MG tablet Take 2 tablets (10 mg total) by mouth daily. 02/24/15   Lendon Colonel, NP  carvedilol (COREG) 12.5 MG tablet TAKE 3 TABLETS BY MOUTH TWICE DAILY WITH A MEAL. 02/24/15   Lendon Colonel, NP  hydrALAZINE (APRESOLINE) 50 MG tablet Take 1  tablet (50 mg total) by mouth 3 (three) times daily. 11/05/14   Lendon Colonel, NP  lisinopril (PRINIVIL,ZESTRIL) 20 MG tablet Take 2 tablets (40 mg total) by mouth daily. 02/24/15   Lendon Colonel, NP  potassium chloride SA (K-DUR,KLOR-CON) 20 MEQ tablet Take 2 tablets (40 mEq total) by mouth daily. 03/27/15   Lendon Colonel, NP  spironolactone (ALDACTONE) 25 MG tablet Take 1 tablet (25 mg total) by mouth daily. 02/24/15   Lendon Colonel, NP  torsemide (DEMADEX) 20 MG tablet Take 1 tablet (20 mg total) by mouth daily. 02/24/15   Lendon Colonel, NP   BP 173/103 mmHg  Pulse 85   Temp(Src) 97.9 F (36.6 C) (Oral)  Resp 16  Ht 5\' 6"  (1.676 m)  Wt 112.038 kg  BMI 39.89 kg/m2  SpO2 100%  LMP 05/05/2015   Physical Exam  Constitutional: She is oriented to person, place, and time. She appears well-developed and well-nourished. No distress.  HENT:  Head: Normocephalic and atraumatic.  Neck: Normal range of motion. Neck supple.  Cardiovascular: Normal rate, regular rhythm, normal heart sounds and intact distal pulses.   No murmur heard. Pulmonary/Chest: Effort normal and breath sounds normal. No respiratory distress.  Abdominal: Soft. She exhibits no distension. There is no tenderness.  Musculoskeletal: She exhibits tenderness. She exhibits no edema.       Lumbar back: She exhibits tenderness and pain. She exhibits normal range of motion, no swelling, no deformity, no laceration and normal pulse.  Diffuse ttp of the lower bilateral lumbar paraspinal muscles.  DP pulses are brisk and symmetrical.  Distal sensation intact.  Pt has 5/5 strength against resistance of bilateral lower extremities.     Neurological: She is alert and oriented to person, place, and time. She has normal strength. No sensory deficit. She exhibits normal muscle tone. Coordination and gait normal.  Reflex Scores:      Patellar reflexes are 2+ on the right side and 2+ on the left side.      Achilles reflexes are 2+ on the right side and 2+ on the left side. Skin: Skin is warm and dry. No rash noted.  Nursing note and vitals reviewed.   ED Course  Procedures (including critical care time) Labs Review Labs Reviewed  PREGNANCY, URINE  URINALYSIS, ROUTINE W REFLEX MICROSCOPIC (NOT AT [facility])    Imaging Review Dg Lumbar Spine Complete  05/12/2015  CLINICAL DATA:  Pt states she has been having lower back pain onset for 4 weeks. Pt denies injury or surgery to lower back. EXAM: LUMBAR SPINE - COMPLETE 4+ VIEW COMPARISON:  CT 05/01/2014 FINDINGS: There is no evidence of lumbar spine fracture.  Alignment is normal. Intervertebral disc spaces are maintained.Surgical clips right upper abdomen. Bilateral pelvic phleboliths. Facet DJD most marked left L5-S1. IMPRESSION: 1. No fracture or other acute abnormality. 2. Facet DJD most marked left L5-S1. Electronically Signed   By: Lucrezia Europe M.D.   On: 05/12/2015 11:19    I have personally reviewed and evaluated these images and lab results as part of my medical decision-making.    MDM   Final diagnoses:  Bilateral low back pain without sciatica    The patient is well-appearing. Vital signs are stable. Diffuse tenderness to the lower lumbar paraspinal muscles.  Ambulates with a steady gait. No concerning symptoms for emergent neurological or infectious process. Appears stable for d/c, rx for percocet, robaxin.  Agrees to close f/u with her PMD if needed  Kem Parkinson, PA-C 05/15/15 1251  Virgel Manifold, MD 05/28/15 [phone]

## 2015-05-12 NOTE — ED Notes (Signed)
Pt verbalized understanding of no driving and to use caution within 4 hours of taking pain meds due to meds cause drowsiness 

## 2015-06-27 ENCOUNTER — Ambulatory Visit (INDEPENDENT_AMBULATORY_CARE_PROVIDER_SITE_OTHER): Payer: Medicaid Other | Admitting: Cardiology

## 2015-06-27 ENCOUNTER — Encounter: Payer: Self-pay | Admitting: Cardiology

## 2015-06-27 VITALS — BP 128/84 | HR 83 | Ht 66.0 in | Wt 243.0 lb

## 2015-06-27 DIAGNOSIS — G4730 Sleep apnea, unspecified: Secondary | ICD-10-CM

## 2015-06-27 DIAGNOSIS — I5022 Chronic systolic (congestive) heart failure: Secondary | ICD-10-CM

## 2015-06-27 DIAGNOSIS — I10 Essential (primary) hypertension: Secondary | ICD-10-CM

## 2015-06-27 MED ORDER — SACUBITRIL-VALSARTAN 24-26 MG PO TABS: 1.0000 | ORAL_TABLET | Freq: Two times a day (BID) | ORAL | Status: DC

## 2015-06-27 NOTE — Progress Notes (Addendum)
Patient ID: Chelsey Santiago, female   DOB: 01/31/1976, 40 y.o.   MRN: [id number]     Clinical Summary Chelsey Santiago is a 40 y.o.female seen today for follow up of the following medical problems.  1. HTN - history of poor medication compliance, however over the last several months she has been very motivated and dedicated toward compliance   2. Chronic systolic heart failure - chronic SOB/DOe that is somewhat worst over the last few days, she reports cold like symptoms - compliant with toresdmide. Limiting sodium intake.  - she reports long term dry hacking cough she questions may be related to lisoinpril  3. OSA - mixed compliance with CPAP   Past Medical History  Diagnosis Date  . Hypertension     Lab: Normal BMet except glucose of 118 in 09/2010  . Hypertensive heart disease 2009    Pulmonary edema postpartum; mild to moderate mitral regurgitation when hospitalized for CHF in 2009; Echocardiogram in 12/2009-no MR and normal EF; normal CXR in 09/2010  . Migraine headache   . Anemia     H&H of 10.6/33 and 07/2008 and 11.9/35 and 09/2010  . Osteoarthritis, knee 03/29/2011  . Sleep apnea   . Depression with anxiety   . Fasting hyperglycemia   . Obesity 04/16/2009  . CHF (congestive heart failure) ([place])   . Pregnant   . Diabetes mellitus without complication ([place])   . Anxiety   . Enlarged heart   . Pulmonary edema   . Preeclampsia   . Threatened abortion in early pregnancy 03/15/2013  . Miscarriage 03/19/2013     Allergies  Allergen Reactions  . Diclofenac     Edema  . Tramadol Nausea And Vomiting  . Vicodin [Hydrocodone-Acetaminophen] Nausea Only     Current Outpatient Prescriptions  Medication Sig Dispense Refill  . amLODipine (NORVASC) 5 MG tablet Take 2 tablets (10 mg total) by mouth daily. 60 tablet 3  . carvedilol (COREG) 12.5 MG tablet TAKE 3 TABLETS BY MOUTH TWICE DAILY WITH A MEAL. 90 tablet 3  . hydrALAZINE (APRESOLINE) 50 MG tablet Take 1 tablet (50 mg  total) by mouth 3 (three) times daily. 90 tablet 11  . lisinopril (PRINIVIL,ZESTRIL) 20 MG tablet Take 2 tablets (40 mg total) by mouth daily. 60 tablet 3  . methocarbamol (ROBAXIN) 500 MG tablet Take 1 tablet (500 mg total) by mouth 3 (three) times daily. 21 tablet 0  . oxyCODONE-acetaminophen (PERCOCET/ROXICET) 5-325 MG tablet Take 1 tablet by mouth every 4 (four) hours as needed. 15 tablet 0  . potassium chloride SA (K-DUR,KLOR-CON) 20 MEQ tablet Take 2 tablets (40 mEq total) by mouth daily. 30 tablet 6  . spironolactone (ALDACTONE) 25 MG tablet Take 1 tablet (25 mg total) by mouth daily. 30 tablet 3  . torsemide (DEMADEX) 20 MG tablet Take 1 tablet (20 mg total) by mouth daily. 30 tablet 3   No current facility-administered medications for this visit.     Past Surgical History  Procedure Laterality Date  . Breast reduction surgery  2002  . Cesarean section N/A 04/09/2014    Procedure: CESAREAN SECTION;  Surgeon: Mora Bellman, MD;  Location: [facility];  Service: Obstetrics;  Laterality: N/A;     Allergies  Allergen Reactions  . Diclofenac     Edema  . Tramadol Nausea And Vomiting  . Vicodin [Hydrocodone-Acetaminophen] Nausea Only      Family History  Problem Relation Age of Onset  . Diabetes Mother   . Heart disease Mother   .  Hypertension Maternal Uncle   . Hyperlipidemia Paternal Grandfather   . Hypertension Paternal Grandfather   . Sudden death Neg Hx   . Heart attack Neg Hx      Social History [name] reports that she has never smoked. She has never used smokeless tobacco. Chelsey Santiago reports that she does not drink alcohol.   Review of Systems CONSTITUTIONAL: No weight loss, fever, chills, weakness or fatigue.  HEENT: Eyes: No visual loss, blurred vision, double vision or yellow sclerae.No hearing loss, sneezing, congestion, runny nose or sore throat.  SKIN: No rash or itching.  CARDIOVASCULAR: per hpi RESPIRATORY: +cough GASTROINTESTINAL: No  anorexia, nausea, vomiting or diarrhea. No abdominal pain or blood.  GENITOURINARY: No burning on urination, no polyuria NEUROLOGICAL: No headache, dizziness, syncope, paralysis, ataxia, numbness or tingling in the extremities. No change in bowel or bladder control.  MUSCULOSKELETAL: No muscle, back pain, joint pain or stiffness.  LYMPHATICS: No enlarged nodes. No history of splenectomy.  PSYCHIATRIC: No history of depression or anxiety.  ENDOCRINOLOGIC: No reports of sweating, cold or heat intolerance. No polyuria or polydipsia.  Marland Kitchen   Physical Examination Filed Vitals:   06/27/15 1324  BP: 128/84  Pulse: 83   Filed Vitals:   06/27/15 1324  Height: 5\' 6"  (1.676 m)  Weight: 243 lb (110.224 kg)    Gen: resting comfortably, no acute distress HEENT: no scleral icterus, pupils equal round and reactive, no palptable cervical adenopathy,  CV: RRR, no m/r/g, no jvd Resp: Clear to auscultation bilaterally GI: abdomen is soft, non-tender, non-distended, normal bowel sounds, no hepatosplenomegaly MSK: extremities are warm, no edema.  Skin: warm, no rash Neuro:  no focal deficits Psych: appropriate affect   Diagnostic Studies 10/2014 echo Study Conclusions  - Left ventricle: The cavity size was normal. There was moderate concentric hypertrophy. Systolic function was moderately reduced. The estimated ejection fraction was in the range of 35% to 40%. Images were suboptimal for LV wall motion assessment. There appeared to be global hypokinesis. Doppler parameters are consistent with abnormal left ventricular relaxation (grade 1 diastolic dysfunction). - Mitral valve: There was trivial regurgitation. - Left atrium: The atrium was mildly dilated.   06/27/15 Clinic EKG (performed and reviewed in clinic): NSR Assessment and Plan  1.HTN - at goal, continue current meds  2. Chronic systolic heart failure - cough potentially related to lisinopril, we will change her to  entresto 24/26 bid, she will wait 48 hours off lisionpril before starting entresto - continued SOB/DOE, we will refer her to cardiac rehab for her chronic systolic heart failure.  - reports strict compliance with medications, we will report echo to see if LVEF has changed from prior studies.   3. OSA - poor compliance, she has not had her CPAP evaluated in some time. We will refer her to Dr Luan Pulling for assistance with management   F/u 2 months  Arnoldo Lenis, M.D.

## 2015-06-27 NOTE — Patient Instructions (Addendum)
Your physician wants you to follow-up in:  2 months with Dr Harl Bowie   STOP LISINOPRIL,   WAIT 48 hrs,  and then START ENTRESTO 24/26 mg twice a day   Take 81 mg daily  You have been referred to Dr Luan Pulling for sleep apnea   You have been referred to cardiac rehab   Your physician has requested that you have an echocardiogram. Echocardiography is a painless test that uses sound waves to create images of your heart. It provides your doctor with information about the size and shape of your heart and how well your heart's chambers and valves are working. This procedure takes approximately one hour. There are no restrictions for this procedure.         Thank you for choosing [facility] !

## 2015-07-02 ENCOUNTER — Ambulatory Visit (HOSPITAL_COMMUNITY): Payer: Medicaid Other | Attending: Cardiology

## 2015-07-08 ENCOUNTER — Telehealth: Payer: Self-pay

## 2015-07-08 NOTE — Telephone Encounter (Signed)
Agree with your assessment, if not willing to go to ER then visits with PA tomorrow is appropriate  Zandra Abts MD
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
## 2015-07-09 ENCOUNTER — Telehealth: Payer: Self-pay | Admitting: Physician Assistant

## 2015-07-09 ENCOUNTER — Ambulatory Visit: Payer: Medicaid Other | Admitting: Physician Assistant

## 2015-07-09 ENCOUNTER — Encounter: Payer: Self-pay | Admitting: Physician Assistant

## 2015-07-09 NOTE — Telephone Encounter (Signed)
Error/tg °

## 2015-08-06 ENCOUNTER — Encounter (HOSPITAL_COMMUNITY)
Admission: RE | Admit: 2015-08-06 | Discharge: 2015-08-06 | Disposition: A | Discharge status: Home / Self Care | Payer: Medicaid Other | Source: Ambulatory Visit | Attending: Cardiology | Admitting: Cardiology

## 2015-08-06 ENCOUNTER — Encounter (HOSPITAL_COMMUNITY): Payer: Self-pay

## 2015-08-06 VITALS — BP 160/100 | HR 81 | Ht 66.0 in | Wt 238.4 lb

## 2015-08-06 DIAGNOSIS — I5022 Chronic systolic (congestive) heart failure: Secondary | ICD-10-CM | POA: Insufficient documentation

## 2015-08-06 NOTE — Progress Notes (Signed)
Cardiac/Pulmonary Rehab Medication Review by a Pharmacist  Does the patient  feel that his/her medications are working for him/her?  yes  Has the patient been experiencing any side effects to the medications prescribed?  no  Does the patient measure his/her own blood pressure or blood glucose at home?  yes   Does the patient have any problems obtaining medications due to transportation or finances?   no  Understanding of regimen: good Understanding of indications: good Potential of compliance: good  Questions asked to Determine Patient Understanding of Medication Regimen:  1. What is the name of the medication?  2. What is the medication used for?  3. When should it be taken?  4. How much should be taken?  5. How will you take it?  6. What side effects should you report?  Understanding Defined as: Excellent: All questions above are correct Good: Questions 1-4 are correct Fair: Questions 1-2 are correct  Poor: 1 or none of the above questions are correct   Pharmacist comments: Patient is compliant with medications and understands how they work. Would like to decrease the number of blood pressure pills she has to take. Recommended that she keep a current list of medications in purse. Also, patient does monitor blood sugars but not blood pressures. Recommended purchasing blood pressure and monitoring daily. Also, weigh self daily.Tolerating current regimen.  Thanks for allowing me to help with the care of this patient,  Isac Sarna, BS Vena Austria, BCPS Clinical Pharmacist Pager [phone] 08/06/2015 1:58 PM

## 2015-08-08 NOTE — Progress Notes (Signed)
Cardiac Individual Treatment Plan  Patient Details  Name: Chelsey Santiago MRN: [id number] Date of Birth: [birth year] Referring Provider:  Arnoldo Lenis, MD  Initial Encounter Date:       CARDIAC REHAB PHASE II ORIENTATION from 08/06/2015 in [place]   Date  08/06/15      Visit Diagnosis: Chronic systolic heart failure ([place])  Patient's Home Medications on Admission:  Current outpatient prescriptions:  .  amLODipine (NORVASC) 5 MG tablet, Take 2 tablets (10 mg total) by mouth daily., Disp: 60 tablet, Rfl: 3 .  aspirin EC 81 MG tablet, Take 81 mg by mouth daily., Disp: , Rfl:  .  carvedilol (COREG) 12.5 MG tablet, TAKE 3 TABLETS BY MOUTH TWICE DAILY WITH A MEAL., Disp: 90 tablet, Rfl: 3 .  citalopram (CELEXA) 20 MG tablet, Take 20 mg by mouth daily., Disp: , Rfl:  .  isosorbide-hydrALAZINE (BIDIL) 20-37.5 MG tablet, Take 1 tablet by mouth 3 (three) times daily., Disp: , Rfl:  .  metFORMIN (GLUCOPHAGE) 500 MG tablet, Take 500 mg by mouth 2 (two) times daily with a meal., Disp: , Rfl:  .  methocarbamol (ROBAXIN) 500 MG tablet, Take 1 tablet (500 mg total) by mouth 3 (three) times daily., Disp: 21 tablet, Rfl: 0 .  oxyCODONE-acetaminophen (PERCOCET/ROXICET) 5-325 MG tablet, Take 1 tablet by mouth every 4 (four) hours as needed., Disp: 15 tablet, Rfl: 0 .  potassium chloride SA (K-DUR,KLOR-CON) 20 MEQ tablet, Take 2 tablets (40 mEq total) by mouth daily., Disp: 30 tablet, Rfl: 6 .  pregabalin (LYRICA) 75 MG capsule, Take 75 mg by mouth daily., Disp: , Rfl:  .  sacubitril-valsartan (ENTRESTO) 24-26 MG, Take 1 tablet by mouth 2 (two) times daily., Disp: 60 tablet, Rfl: 6 .  sitaGLIPtin (JANUVIA) 100 MG tablet, Take 100 mg by mouth daily., Disp: , Rfl:  .  spironolactone (ALDACTONE) 25 MG tablet, Take 1 tablet (25 mg total) by mouth daily., Disp: 30 tablet, Rfl: 3 .  torsemide (DEMADEX) 20 MG tablet, Take 1 tablet (20 mg total) by mouth daily., Disp: 30 tablet, Rfl:  3  Past Medical History: Past Medical History  Diagnosis Date  . Hypertension     Lab: Normal BMet except glucose of 118 in 09/2010  . Hypertensive heart disease 2009    Pulmonary edema postpartum; mild to moderate mitral regurgitation when hospitalized for CHF in 2009; Echocardiogram in 12/2009-no MR and normal EF; normal CXR in 09/2010  . Migraine headache   . Anemia     H&H of 10.6/33 and 07/2008 and 11.9/35 and 09/2010  . Osteoarthritis, knee 03/29/2011  . Sleep apnea   . Depression with anxiety   . Fasting hyperglycemia   . Obesity 04/16/2009  . CHF (congestive heart failure) ([place])   . Pregnant   . Diabetes mellitus without complication ([place])   . Anxiety   . Enlarged heart   . Pulmonary edema   . Preeclampsia   . Threatened abortion in early pregnancy 03/15/2013  . Miscarriage 03/19/2013    Tobacco Use: History  Smoking status  . Never Smoker   Smokeless tobacco  . Never Used    Labs: Dr. Criss Rosales faxed for results of lipids and A1C.      Recent Review Flowsheet Data    Labs for ITP Cardiac and Pulmonary Rehab Latest Ref Rng 11/20/2004 09/21/2010   Cholestrol - 154 -   LDLCALC - 71 -   HDL - 38 -   Trlycerides - 225 -  TCO2 0 - 100 mmol/L - 28      Capillary Blood Glucose: Lab Results  Component Value Date   GLUCAP 118* 04/30/2014   GLUCAP 99 04/29/2014   GLUCAP 164* 04/29/2014   GLUCAP 149* 04/29/2014   GLUCAP 150* 04/29/2014     Exercise Target Goals: Date: 08/06/15  Exercise Program Goal: Individual exercise prescription set with THRR, safety & activity barriers. Participant demonstrates ability to understand and report RPE using BORG scale, to self-measure pulse accurately, and to acknowledge the importance of the exercise prescription.  Exercise Prescription Goal: Starting with aerobic activity 30 plus minutes a day, 3 days per week for initial exercise prescription. Provide home exercise prescription and guidelines that participant acknowledges  understanding prior to discharge.  Activity Barriers & Risk Stratification:     Activity Barriers & Cardiac Risk Stratification - 08/06/15 1513    Activity Barriers & Cardiac Risk Stratification   Activity Barriers History of Falls      6 Minute Walk:     6 Minute Walk      08/06/15 1437       6 Minute Walk   Phase Initial     Distance 1200 feet     Walk Time 6 minutes     # of Rest Breaks 0     MPH 2.27     METS 2.74     RPE 11     Perceived Dyspnea  14     VO2 Peak 14.59     Symptoms No     Resting HR 81 bpm     Resting BP 160/100 mmHg     Max Ex. HR 100 bpm     Max Ex. BP 190/108 mmHg     2 Minute Post BP 160/100 mmHg     Pre/Post BP   Baseline BP 160/100 mmHg     6 Minute BP 190/108 mmHg     Pre/Post BP? Yes  Patient BP elevated. Patient reported that BP values have been consistantly high due to stress and nervousness. All other vitals WNL.        Initial Exercise Prescription:     Initial Exercise Prescription - 08/06/15 1455    Date of Initial Exercise Prescription   Date 08/06/15   Treadmill   MPH 1.5   Grade 0   Minutes 15   METs 2.15   NuStep   Level 2   Minutes 15   METs 1.5   Arm Ergometer   Level 1.5   Minutes 15   METs 1.5   Recumbant Elliptical   Level 1   Minutes 15   METs 1.5   Prescription Details   Frequency (times per week) 3   Intensity   THRR REST +  30   THRR 40-80% of Max Heartrate 121-160   Ratings of Perceived Exertion 11-13   Progression   Progression Continue to progress workloads to maintain intensity without signs/symptoms of physical distress.   Resistance Training   Training Prescription Yes   Weight 1.0   Reps 10-12      Perform Capillary Blood Glucose checks as needed.  Exercise Prescription Changes:   Exercise Comments:    Discharge Exercise Prescription (Final Exercise Prescription Changes):   Nutrition:  Target Goals: Understanding of nutrition guidelines, daily intake of sodium 1500mg ,  cholesterol 200mg , calories 30% from fat and 7% or less from saturated fats, daily to have 5 or more servings of fruits and vegetables.  Biometrics:  Pre Biometrics - 08/06/15 1415    Pre Biometrics   Height 5\' 6"  (1.676 m)   Weight 238 lb 6.4 oz (108.138 kg)   Waist Circumference 44 inches   Hip Circumference 50 inches   Waist to Hip Ratio 0.88 %   BMI (Calculated) 38.6   Triceps Skinfold 26 mm   % Body Fat 45 %   Grip Strength 66 kg   Flexibility 0 in   Single Leg Stand 60 seconds       Nutrition Therapy Plan and Nutrition Goals:     Nutrition Therapy & Goals - 08/06/15 1514    Intervention Plan   Intervention Nutrition handout(s) given to patient.   Expected Outcomes Long Term Goal: Adherence to prescribed nutrition plan.      Nutrition Discharge: Rate Your Plate Scores:     Nutrition Assessments - 08/06/15 1515    MEDFICTS Scores   Pre Score 75      Nutrition Goals Re-Evaluation:   Psychosocial: Target Goals: Acknowledge presence or absence of depression, maximize coping skills, provide positive support system. Participant is able to verbalize types and ability to use techniques and skills needed for reducing stress and depression.  Initial Review & Psychosocial Screening:     Initial Psych Review & Screening - 08/06/15 1521    Initial Review   Current issues with Current Depression;Current Anxiety/Panic;Current Stress Concerns   Source of Stress Concerns Poor Coping Skills;Chronic Illness;Unable to participate in former interests or hobbies   [place]? Yes   Barriers   Psychosocial barriers to participate in program The patient should benefit from training in stress management and relaxation.   Screening Interventions   Interventions Encouraged to exercise;Program counselor consult      Quality of Life Scores:     Quality of Life - 08/06/15 1400    Quality of Life Scores   Health/Function Pre 5.6 %    Socioeconomic Pre 19 %   Psych/Spiritual Pre 7.71 %   Family Pre 8.4 %   GLOBAL Pre 8.91 %      PHQ-9:     Recent Review Flowsheet Data    Depression screen [facility] 2/9 08/06/2015   Decreased Interest 3   Down, Depressed, Hopeless 3   PHQ - 2 Score 6   Altered sleeping 3   Tired, decreased energy 3   Change in appetite 2   Feeling bad or failure about yourself  3   Trouble concentrating 2   Moving slowly or fidgety/restless 0   Suicidal thoughts 2   PHQ-9 Score 21   Difficult doing work/chores Very difficult      Psychosocial Evaluation and Intervention:     Psychosocial Evaluation - 08/06/15 0848    Psychosocial Evaluation & Interventions   Interventions Stress management education;Relaxation education;Encouraged to exercise with the program and follow exercise prescription;Therapist referral   Comments Patient already see counselor for her depression.    Continued Psychosocial Services Needed Yes      Psychosocial Re-Evaluation:   Vocational Rehabilitation: Provide vocational rehab assistance to qualifying candidates.   Vocational Rehab Evaluation & Intervention:     Vocational Rehab - 08/06/15 1514    Initial Vocational Rehab Evaluation & Intervention   Assessment shows need for Vocational Rehabilitation No      Education: Education Goals: Education classes will be provided on a weekly basis, covering required topics. Participant will state understanding/return demonstration of topics presented.  Learning Barriers/Preferences:     Learning  Barriers/Preferences - 08/06/15 1513    Learning Barriers/Preferences   Learning Barriers None   Learning Preferences Skilled Demonstration      Education Topics: Hypertension, Hypertension Reduction -Define heart disease and high blood pressure. Discus how high blood pressure affects the body and ways to reduce high blood pressure.   Exercise and Your Heart -Discuss why it is important to exercise, the FITT  principles of exercise, normal and abnormal responses to exercise, and how to exercise safely.   Angina -Discuss definition of angina, causes of angina, treatment of angina, and how to decrease risk of having angina.   Cardiac Medications -Review what the following cardiac medications are used for, how they affect the body, and side effects that may occur when taking the medications.  Medications include Aspirin, Beta blockers, calcium channel blockers, ACE Inhibitors, angiotensin receptor blockers, diuretics, digoxin, and antihyperlipidemics.   Congestive Heart Failure -Discuss the definition of CHF, how to live with CHF, the signs and symptoms of CHF, and how keep track of weight and sodium intake.   Heart Disease and Intimacy -Discus the effect sexual activity has on the heart, how changes occur during intimacy as we age, and safety during sexual activity.   Smoking Cessation / COPD -Discuss different methods to quit smoking, the health benefits of quitting smoking, and the definition of COPD.   Nutrition I: Fats -Discuss the types of cholesterol, what cholesterol does to the heart, and how cholesterol levels can be controlled.   Nutrition II: Labels -Discuss the different components of food labels and how to read food label   Heart Parts and Heart Disease -Discuss the anatomy of the heart, the pathway of blood circulation through the heart, and these are affected by heart disease.   Stress I: Signs and Symptoms -Discuss the causes of stress, how stress may lead to anxiety and depression, and ways to limit stress.   Stress II: Relaxation -Discuss different types of relaxation techniques to limit stress.   Warning Signs of Stroke / TIA -Discuss definition of a stroke, what the signs and symptoms are of a stroke, and how to identify when someone is having stroke.   Knowledge Questionnaire Score:     Knowledge Questionnaire Score - 08/06/15 1513    Knowledge  Questionnaire Score   Pre Score 23/24      Core Components/Risk Factors/Patient Goals at Admission:     Personal Goals and Risk Factors at Admission - 08/06/15 1518    Core Components/Risk Factors/Patient Goals on Admission    Weight Management Obesity;Yes   Admit Weight 238 lb 6.4 oz (108.138 kg)   Goal Weight: Short Term 233 lb (105.688 kg)   Goal Weight: Long Term 223 lb (101.152 kg)   Expected Outcomes Short Term: Continue to assess and modify interventions until short term weight is achieved.;Long Term: Adherence to nutrition and physical activity/exercise program aimed toward attainment of established weight goal.   Improve shortness of breath with ADL's Yes   Intervention Provide education, individualized exercise plan and daily activity instruction to help decrease symptoms of SOB with activities of daily living.   Expected Outcomes Short Term: Achieves a reduction of symptoms when performing activities of daily living.   Diabetes Yes   Intervention Provide education about signs/symptoms and action to take for hypo/hyperglycemia.;Provide education about proper nutrition, including hydration, and aerobic/resistive exercise prescription along with prescribed medications to achieve blood glucose in normal ranges: Fasting glucose 65-99 mg/dL   Expected Outcomes Short Term: Participant verbalizes understanding of  the signs/symptoms and immediate care of hyper/hypoglycemia, proper foot care and importance of medication, aerobic/resistive exercise and nutrition plan for blood glucose control.   Hypertension Yes   Intervention Provide education on lifestyle modifcations including regular physical activity/exercise, weight management, moderate sodium restriction and increased consumption of fresh fruit, vegetables, and low fat dairy, alcohol moderation, and smoking cessation.;Monitor prescription use compliance.   Expected Outcomes Long Term: Maintenance of blood pressure at goal levels.    Stress Yes   Intervention Offer individual and/or small group education and counseling on adjustment to heart disease, stress management and health-related lifestyle change. Teach and support self-help strategies.;Refer participants experiencing significant psychosocial distress to appropriate mental health specialists for further evaluation and treatment. When possible, include family members and significant others in education/counseling sessions.   Expected Outcomes Short Term: Participant demonstrates changes in health-related behavior, relaxation and other stress management skills, ability to obtain effective social support, and compliance with psychotropic medications if prescribed.      Core Components/Risk Factors/Patient Goals Review:    Core Components/Risk Factors/Patient Goals at Discharge (Final Review):    ITP Comments:     ITP Comments      08/06/15 1514           ITP Comments Patient is a 40 year old female who lives with boyfriend and children. Patient is very depressed with her life. Patient states she already sees a counselor for her depression meds.  Advised patient she really needs to sit down and talk with the counselor at her next appointment next week.  Staff will monitor depression.           Comments:

## 2015-08-08 NOTE — Patient Instructions (Signed)
Patient arrived for 1st visit/orientation/education at 1330. Patient was referred to CR by Dr. Harl Bowie due to CHF (I50.22). During orientation advised patient on arrival and appointment times what to wear, what to do before, during and after exercise. Reviewed attendance and class policy. Talked about inclement weather and class consultation policy. Pt is scheduled to return Cardiac Rehab on 08/11/15 at 0930. Pt was advised to come to class 5 minutes before class starts. She was also given instructions on meeting with the dietician and attending the Family Structure classes. Patients entrance PHQ score is 21.Pt is eager to get started. Patient was able to complete 6 minute walk test. Patient was measured for the equipment. Discussed equipment safety with patient. Took patient pre-anthropometric measurements. Patient finished visit at 1530 Pt has been instructed to arrive to class 15 minutes early for scheduled class. Pt has been instructed to wear comfortable clothing and shoes with rubber soles. Pt has been told to take their medications 1 hour prior to coming to class.  If the patient is not going to attend class, she has been instructed to call.  Marland Kitchen

## 2015-08-11 ENCOUNTER — Encounter (HOSPITAL_COMMUNITY): Payer: Medicaid Other

## 2015-08-13 ENCOUNTER — Encounter (HOSPITAL_COMMUNITY): Payer: Medicaid Other

## 2015-08-15 ENCOUNTER — Encounter (HOSPITAL_COMMUNITY): Payer: Medicaid Other

## 2015-08-18 ENCOUNTER — Encounter (HOSPITAL_COMMUNITY): Payer: Medicaid Other

## 2015-08-20 ENCOUNTER — Encounter (HOSPITAL_COMMUNITY): Payer: Medicaid Other

## 2015-08-21 ENCOUNTER — Other Ambulatory Visit: Payer: Self-pay | Admitting: Adult Health

## 2015-08-22 ENCOUNTER — Encounter (HOSPITAL_COMMUNITY): Payer: Medicaid Other

## 2015-08-25 ENCOUNTER — Encounter (HOSPITAL_COMMUNITY): Payer: Medicaid Other

## 2015-08-26 NOTE — Addendum Note (Signed)
Encounter addended by: Cathie Olden, RN on: 08/26/2015 12:02 PM<BR>     Documentation filed: Clinical Notes, Notes Section

## 2015-08-26 NOTE — Progress Notes (Signed)
ENTRY DATE 08/26/15.  Patient discharged from CR today.  Patient never showed for visits after orientation and did not call staff back.  Letter sent to patient 08/20/15.

## 2015-08-27 ENCOUNTER — Encounter (HOSPITAL_COMMUNITY): Payer: Medicaid Other

## 2015-08-28 ENCOUNTER — Encounter: Payer: Medicaid Other | Admitting: Cardiology

## 2015-08-28 ENCOUNTER — Encounter: Payer: Self-pay | Admitting: Cardiology

## 2015-08-28 NOTE — Progress Notes (Signed)
Patient ID: Chelsey Santiago, female   DOB: [birth year], 40 y.o.   MRN: [id number]     Clinical Summary [name] is a 40 y.o.female female seen today for follow up of the following medical problems.  1. HTN - history of poor medication compliance, however over the last several months she has been very motivated and dedicated toward compliance   2. Chronic systolic heart failure -echo 10/2014 LVEF 123456, grade I diastolic dysfunction - chronic SOB/DOe that is somewhat worst over the last few days, she reports cold like symptoms - compliant with toresdmide. Limiting sodium intake.  - she reports long term dry hacking cough she questions may be related to lisoinpril  - last visit stopped lisionpril due to cough, started entresto.  - referred to cardiac rehab last visit. We also ordered a repeat echo but does not appear it was done.   3. OSA - mixed compliance with CPAP Past Medical History  Diagnosis Date  . Hypertension     Lab: Normal BMet except glucose of 118 in 09/2010  . Hypertensive heart disease 2009    Pulmonary edema postpartum; mild to moderate mitral regurgitation when hospitalized for CHF in 2009; Echocardiogram in 12/2009-no MR and normal EF; normal CXR in 09/2010  . Migraine headache   . Anemia     H&H of 10.6/33 and 07/2008 and 11.9/35 and 09/2010  . Osteoarthritis, knee 03/29/2011  . Sleep apnea   . Depression with anxiety   . Fasting hyperglycemia   . Obesity 04/16/2009  . CHF (congestive heart failure) ([place])   . Pregnant   . Diabetes mellitus without complication ([place])   . Anxiety   . Enlarged heart   . Pulmonary edema   . Preeclampsia   . Threatened abortion in early pregnancy 03/15/2013  . Miscarriage 03/19/2013     Allergies  Allergen Reactions  . Diclofenac     Edema  . Tramadol Nausea And Vomiting  . Vicodin [Hydrocodone-Acetaminophen] Nausea Only     Current Outpatient Prescriptions  Medication Sig Dispense Refill  . amLODipine (NORVASC) 5  MG tablet Take 2 tablets (10 mg total) by mouth daily. 60 tablet 3  . aspirin EC 81 MG tablet Take 81 mg by mouth daily.    . carvedilol (COREG) 12.5 MG tablet TAKE 3 TABLETS BY MOUTH TWICE DAILY WITH A MEAL. 90 tablet 6  . citalopram (CELEXA) 20 MG tablet Take 20 mg by mouth daily.    . isosorbide-hydrALAZINE (BIDIL) 20-37.5 MG tablet Take 1 tablet by mouth 3 (three) times daily.    . metFORMIN (GLUCOPHAGE) 500 MG tablet Take 500 mg by mouth 2 (two) times daily with a meal.    . methocarbamol (ROBAXIN) 500 MG tablet Take 1 tablet (500 mg total) by mouth 3 (three) times daily. 21 tablet 0  . oxyCODONE-acetaminophen (PERCOCET/ROXICET) 5-325 MG tablet Take 1 tablet by mouth every 4 (four) hours as needed. 15 tablet 0  . potassium chloride SA (K-DUR,KLOR-CON) 20 MEQ tablet Take 2 tablets (40 mEq total) by mouth daily. 30 tablet 6  . pregabalin (LYRICA) 75 MG capsule Take 75 mg by mouth daily.    . sacubitril-valsartan (ENTRESTO) 24-26 MG Take 1 tablet by mouth 2 (two) times daily. 60 tablet 6  . sitaGLIPtin (JANUVIA) 100 MG tablet Take 100 mg by mouth daily.    Marland Kitchen spironolactone (ALDACTONE) 25 MG tablet Take 1 tablet (25 mg total) by mouth daily. 30 tablet 3  . torsemide (DEMADEX) 20 MG tablet Take 1  tablet (20 mg total) by mouth daily. 30 tablet 3   No current facility-administered medications for this visit.     Past Surgical History  Procedure Laterality Date  . Breast reduction surgery  2002  . Cesarean section N/A 04/09/2014    Procedure: CESAREAN SECTION;  Surgeon: Mora Bellman, MD;  Location: [facility];  Service: Obstetrics;  Laterality: N/A;  . Cholecystectomy       Allergies  Allergen Reactions  . Diclofenac     Edema  . Tramadol Nausea And Vomiting  . Vicodin [Hydrocodone-Acetaminophen] Nausea Only      Family History  Problem Relation Age of Onset  . Diabetes Mother   . Heart disease Mother   . Hypertension Maternal Uncle   . Hyperlipidemia Paternal Grandfather   .  Hypertension Paternal Grandfather   . Sudden death Neg Hx   . Heart disease Father   . Heart disease Maternal Grandmother   . Heart attack Brother      Social History [name] reports that she has never smoked. She has never used smokeless tobacco. [name] reports that she does not drink alcohol.   Review of Systems CONSTITUTIONAL: No weight loss, fever, chills, weakness or fatigue.  HEENT: Eyes: No visual loss, blurred vision, double vision or yellow sclerae.No hearing loss, sneezing, congestion, runny nose or sore throat.  SKIN: No rash or itching.  CARDIOVASCULAR:  RESPIRATORY: No shortness of breath, cough or sputum.  GASTROINTESTINAL: No anorexia, nausea, vomiting or diarrhea. No abdominal pain or blood.  GENITOURINARY: No burning on urination, no polyuria NEUROLOGICAL: No headache, dizziness, syncope, paralysis, ataxia, numbness or tingling in the extremities. No change in bowel or bladder control.  MUSCULOSKELETAL: No muscle, back pain, joint pain or stiffness.  LYMPHATICS: No enlarged nodes. No history of splenectomy.  PSYCHIATRIC: No history of depression or anxiety.  ENDOCRINOLOGIC: No reports of sweating, cold or heat intolerance. No polyuria or polydipsia.  Marland Kitchen   Physical Examination There were no vitals filed for this visit. There were no vitals filed for this visit.  Gen: resting comfortably, no acute distress HEENT: no scleral icterus, pupils equal round and reactive, no palptable cervical adenopathy,  CV Resp: Clear to auscultation bilaterally GI: abdomen is soft, non-tender, non-distended, normal bowel sounds, no hepatosplenomegaly MSK: extremities are warm, no edema.  Skin: warm, no rash Neuro:  no focal deficits Psych: appropriate affect   Diagnostic Studies 10/2014 echo Study Conclusions  - Left ventricle: The cavity size was normal. There was moderate concentric hypertrophy. Systolic function was moderately reduced. The estimated  ejection fraction was in the range of 35% to 40%. Images were suboptimal for LV wall motion assessment. There appeared to be global hypokinesis. Doppler parameters are consistent with abnormal left ventricular relaxation (grade 1 diastolic dysfunction). - Mitral valve: There was trivial regurgitation. - Left atrium: The atrium was mildly dilated.     Assessment and Plan  1.HTN - at goal, continue current meds  2. Chronic systolic heart failure - etiology of her systolic dysfunction is unclear. Timing of diagnosis would suggest possible peripartum CM. However she has known longstanding severe HTN with mixed medication compliance, with evidence of severe LA enlargement suggesting more chronic cardiac stress and potential hypertensive CM. Evidence of increased LA pressures and LA enlargement date back as far as 2009 based on studies.  - cough potentially related to lisinopril, we will change her to entresto 24/26 bid, she will wait 48 hours off lisionpril before starting entresto - continued SOB/DOE, we  will refer her to cardiac rehab for her chronic systolic heart failure.  - reports strict compliance with medications, we will report echo to see if LVEF has changed from prior studies.   3. OSA - poor compliance, she has not had her CPAP evaluated in some time. We will refer her to Dr Luan Pulling for assistance with management   F/u 2 months      Arnoldo Lenis, M.D., F.A.C.C.

## 2015-08-29 ENCOUNTER — Encounter (HOSPITAL_COMMUNITY): Payer: Medicaid Other

## 2015-09-01 ENCOUNTER — Encounter (HOSPITAL_COMMUNITY): Payer: Medicaid Other

## 2015-09-03 ENCOUNTER — Encounter (HOSPITAL_COMMUNITY): Payer: Medicaid Other

## 2015-09-04 ENCOUNTER — Observation Stay (HOSPITAL_COMMUNITY)
Admission: EM | Admit: 2015-09-04 | Discharge: 2015-09-05 | Disposition: A | Discharge status: Home / Self Care | Payer: Medicaid Other | Attending: Internal Medicine | Admitting: Internal Medicine

## 2015-09-04 ENCOUNTER — Emergency Department (HOSPITAL_COMMUNITY): Payer: Medicaid Other

## 2015-09-04 ENCOUNTER — Encounter (HOSPITAL_COMMUNITY): Payer: Self-pay

## 2015-09-04 DIAGNOSIS — E1165 Type 2 diabetes mellitus with hyperglycemia: Secondary | ICD-10-CM | POA: Diagnosis not present

## 2015-09-04 DIAGNOSIS — I110 Hypertensive heart disease with heart failure: Secondary | ICD-10-CM | POA: Insufficient documentation

## 2015-09-04 DIAGNOSIS — R079 Chest pain, unspecified: Principal | ICD-10-CM | POA: Diagnosis present

## 2015-09-04 DIAGNOSIS — F329 Major depressive disorder, single episode, unspecified: Secondary | ICD-10-CM | POA: Diagnosis not present

## 2015-09-04 DIAGNOSIS — Z7984 Long term (current) use of oral hypoglycemic drugs: Secondary | ICD-10-CM | POA: Insufficient documentation

## 2015-09-04 DIAGNOSIS — Z7982 Long term (current) use of aspirin: Secondary | ICD-10-CM | POA: Insufficient documentation

## 2015-09-04 DIAGNOSIS — I10 Essential (primary) hypertension: Secondary | ICD-10-CM

## 2015-09-04 DIAGNOSIS — G4733 Obstructive sleep apnea (adult) (pediatric): Secondary | ICD-10-CM | POA: Diagnosis present

## 2015-09-04 DIAGNOSIS — E669 Obesity, unspecified: Secondary | ICD-10-CM | POA: Insufficient documentation

## 2015-09-04 DIAGNOSIS — Z79891 Long term (current) use of opiate analgesic: Secondary | ICD-10-CM | POA: Insufficient documentation

## 2015-09-04 DIAGNOSIS — Z9989 Dependence on other enabling machines and devices: Secondary | ICD-10-CM

## 2015-09-04 DIAGNOSIS — I160 Hypertensive urgency: Secondary | ICD-10-CM | POA: Diagnosis present

## 2015-09-04 DIAGNOSIS — I509 Heart failure, unspecified: Secondary | ICD-10-CM | POA: Insufficient documentation

## 2015-09-04 DIAGNOSIS — M179 Osteoarthritis of knee, unspecified: Secondary | ICD-10-CM | POA: Diagnosis not present

## 2015-09-04 DIAGNOSIS — E119 Type 2 diabetes mellitus without complications: Secondary | ICD-10-CM

## 2015-09-04 DIAGNOSIS — I5022 Chronic systolic (congestive) heart failure: Secondary | ICD-10-CM | POA: Diagnosis present

## 2015-09-04 LAB — CBC WITH DIFFERENTIAL/PLATELET
Basophils Absolute: 0 10*3/uL (ref 0.0–0.1)
Basophils Relative: 0 %
EOS ABS: 0.1 10*3/uL (ref 0.0–0.7)
EOS PCT: 1 %
HCT: 32 % — ABNORMAL LOW (ref 36.0–46.0)
Hemoglobin: 10 g/dL — ABNORMAL LOW (ref 12.0–15.0)
LYMPHS ABS: 2.1 10*3/uL (ref 0.7–4.0)
LYMPHS PCT: 23 %
MCH: 22.4 pg — AB (ref 26.0–34.0)
MCHC: 31.3 g/dL (ref 30.0–36.0)
MCV: 71.7 fL — AB (ref 78.0–100.0)
MONO ABS: 0.7 10*3/uL (ref 0.1–1.0)
MONOS PCT: 8 %
Neutro Abs: 6.3 10*3/uL (ref 1.7–7.7)
Neutrophils Relative %: 68 %
PLATELETS: 263 10*3/uL (ref 150–400)
RBC: 4.46 MIL/uL (ref 3.87–5.11)
RDW: 16 % — ABNORMAL HIGH (ref 11.5–15.5)
WBC: 9.2 10*3/uL (ref 4.0–10.5)

## 2015-09-04 LAB — COMPREHENSIVE METABOLIC PANEL
ALT: 15 U/L (ref 14–54)
ANION GAP: 13 (ref 5–15)
AST: 19 U/L (ref 15–41)
Albumin: 3.6 g/dL (ref 3.5–5.0)
Alkaline Phosphatase: 79 U/L (ref 38–126)
BUN: 12 mg/dL (ref 6–20)
CALCIUM: 8.7 mg/dL — AB (ref 8.9–10.3)
CHLORIDE: 98 mmol/L — AB (ref 101–111)
CO2: 26 mmol/L (ref 22–32)
CREATININE: 0.85 mg/dL (ref 0.44–1.00)
Glucose, Bld: 167 mg/dL — ABNORMAL HIGH (ref 65–99)
Potassium: 3 mmol/L — ABNORMAL LOW (ref 3.5–5.1)
SODIUM: 137 mmol/L (ref 135–145)
Total Bilirubin: 0.4 mg/dL (ref 0.3–1.2)
Total Protein: 7.8 g/dL (ref 6.5–8.1)

## 2015-09-04 LAB — TROPONIN I

## 2015-09-04 LAB — CBG MONITORING, ED: Glucose-Capillary: 159 mg/dL — ABNORMAL HIGH (ref 65–99)

## 2015-09-04 LAB — BRAIN NATRIURETIC PEPTIDE: B NATRIURETIC PEPTIDE 5: 184 pg/mL — AB (ref 0.0–100.0)

## 2015-09-04 MED ORDER — NITROGLYCERIN 0.4 MG SL SUBL
0.4000 mg | SUBLINGUAL_TABLET | SUBLINGUAL | Status: AC | PRN
  Administered 2015-09-04 (×3): 0.4 mg via SUBLINGUAL
  Filled 2015-09-04: qty 1

## 2015-09-04 MED ORDER — ASPIRIN 81 MG PO CHEW
324.0000 mg | CHEWABLE_TABLET | Freq: Once | ORAL | Status: AC
  Administered 2015-09-04: 324 mg via ORAL
  Filled 2015-09-04: qty 4

## 2015-09-04 NOTE — ED Notes (Signed)
Blood sugar is high per pt.  It has been high today. It was 470 at home and took my meds  And got it down to 180.  I have been vomiting.

## 2015-09-04 NOTE — ED Notes (Signed)
Pt is tearful complaining of weakness, BS at home 470 took medication. Came down to 180 at home. Pt has hx CHF, BP is elevated, edema in right foot.

## 2015-09-04 NOTE — ED Notes (Signed)
Pt states nitro did not change pain level.

## 2015-09-04 NOTE — ED Provider Notes (Signed)
By signing my name below, I, Helane Gunther, attest that this documentation has been prepared under the direction and in the presence of [employer], DO. Electronically Signed: Helane Gunther, ED Scribe. 09/04/2015. 11:26 PM.  TIME SEEN: 11:19 PM  CHIEF COMPLAINT: Hyperglycemia, Chest Pain  HPI: Chelsey Santiago is a 40 y.o. 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 Pt states she has been feeling sick for the past few days. She notes that while she was at her child's PCP she broke out in a sweat and felt weak and lightheaded and assumed her blood sugar was low. She states they did not check her blood sugar. She states she was then given peanuts and a Coke and she began feeling better. She notes that when she got home she measured her sugar at 470. She states she then took her medications (Januvia and metformin), after which it dropped to 180. She reports associated SOB, lightheadedness, nausea, and vomiting onset just PTA. Denies abdominal pain or diarrhea. She states her usual BP is in the 160's/110. She notes her mother had similar symptoms with fatal CHF (aged 82). 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 She states she has been taking Lyrica with some relief of this neuropathy. She denies a PMHx of smoking and high cholesterol. States she has never had a stress test or cardiac catheterization. No history of PE or DVT.   ROS: See HPI Constitutional: no fever  Eyes: no drainage  ENT: no runny nose   Cardiovascular:  chest pain  Resp: SOB  GI: vomiting GU: no dysuria Integumentary: no rash  Allergy: no hives  Musculoskeletal: no leg swelling   Neurological: no slurred speech ROS otherwise negative  PAST MEDICAL HISTORY/PAST SURGICAL HISTORY:  Past Medical History  Diagnosis Date  . Hypertension     Lab: Normal BMet except glucose of 118 in 09/2010  . Hypertensive heart disease 2009    Pulmonary edema postpartum; mild to moderate mitral regurgitation when hospitalized for CHF in 2009; Echocardiogram in 12/2009-no MR and normal EF; normal CXR in 09/2010  . Migraine headache   . Anemia     H&H of 10.6/33 and 07/2008 and 11.9/35 and 09/2010  . Osteoarthritis, knee 03/29/2011  . Sleep apnea   . Depression with anxiety   . Fasting hyperglycemia   . Obesity 04/16/2009  . CHF (congestive heart failure) ([place])   . Pregnant   . Diabetes mellitus without complication ([place])   . Anxiety   . Enlarged heart   . Pulmonary edema   . Preeclampsia   . Threatened abortion in early pregnancy 03/15/2013  . Miscarriage 03/19/2013    MEDICATIONS:  Prior to Admission medications   Medication Sig Start Date End Date Taking? Authorizing Provider  amLODipine (NORVASC) 5 MG tablet Take 2 tablets (10 mg total) by mouth daily. 02/24/15   Lendon Colonel, NP  aspirin EC 81 MG tablet Take 81 mg by mouth daily.    Historical Provider, MD  carvedilol (COREG) 12.5 MG tablet TAKE 3 TABLETS BY MOUTH TWICE DAILY WITH A MEAL. 08/21/15   Arnoldo Lenis, MD  citalopram (CELEXA) 20  MG tablet Take 20 mg by mouth daily.    Historical Provider, MD  isosorbide-hydrALAZINE (BIDIL) 20-37.5 MG tablet Take 1 tablet by mouth 3 (three) times daily.    Historical Provider, MD  metFORMIN (GLUCOPHAGE) 500 MG tablet Take 500 mg by mouth 2 (two) times daily with a meal.    Historical Provider, MD  methocarbamol (ROBAXIN) 500 MG tablet Take 1 tablet (500 mg total) by mouth 3 (three) times daily. 05/12/15   Tammy Triplett, PA-C  oxyCODONE-acetaminophen (PERCOCET/ROXICET) 5-325 MG tablet Take 1 tablet by mouth every 4 (four) hours as needed. 05/12/15   Tammy Triplett, PA-C   potassium chloride SA (K-DUR,KLOR-CON) 20 MEQ tablet Take 2 tablets (40 mEq total) by mouth daily. 03/27/15   Lendon Colonel, NP  pregabalin (LYRICA) 75 MG capsule Take 75 mg by mouth daily.    Historical Provider, MD  sacubitril-valsartan (ENTRESTO) 24-26 MG Take 1 tablet by mouth 2 (two) times daily. 06/27/15   Arnoldo Lenis, MD  sitaGLIPtin (JANUVIA) 100 MG tablet Take 100 mg by mouth daily.    Historical Provider, MD  spironolactone (ALDACTONE) 25 MG tablet Take 1 tablet (25 mg total) by mouth daily. 02/24/15   Lendon Colonel, NP  torsemide (DEMADEX) 20 MG tablet Take 1 tablet (20 mg total) by mouth daily. 02/24/15   Lendon Colonel, NP    ALLERGIES:  Allergies  Allergen Reactions  . Diclofenac     Edema  . Tramadol Nausea And Vomiting  . Vicodin [Hydrocodone-Acetaminophen] Nausea Only    SOCIAL HISTORY:  Social History  Substance Use Topics  . Smoking status: Never Smoker   . Smokeless tobacco: Never Used  . Alcohol Use: No     Comment: occ    FAMILY HISTORY: Family History  Problem Relation Age of Onset  . Diabetes Mother   . Heart disease Mother   . Hypertension Maternal Uncle   . Hyperlipidemia Paternal Grandfather   . Hypertension Paternal Grandfather   . Sudden death Neg Hx   . Heart disease Father   . Heart disease Maternal Grandmother   . Heart attack Brother     EXAM: BP 195/123 mmHg  Pulse 93  Temp(Src) 98.4 F (36.9 C) (Oral)  Resp 18  Ht 5\' 6"  (1.676 m)  Wt 230 lb (104.327 kg)  BMI 37.14 kg/m2  SpO2 100%  LMP 08/22/2015 CONSTITUTIONAL: Alert and oriented and responds appropriately to questions. Well-appearing; well-nourished HEAD: Normocephalic EYES: Conjunctivae clear, PERRL ENT: normal nose; no rhinorrhea; moist mucous membranes NECK: Supple, no meningismus, no LAD  CARD: RRR; S1 and S2 appreciated; no murmurs, no clicks, no rubs, no gallops RESP: Normal chest excursion without splinting or tachypnea; breath sounds clear and  equal bilaterally; no wheezes, no rhonchi, no rales, no hypoxia or respiratory distress, speaking full sentences ABD/GI: Normal bowel sounds; non-distended; soft, non-tender, no rebound, no guarding, no peritoneal signs BACK:  The back appears normal and is non-tender to palpation, there is no CVA tenderness EXT: Normal ROM in all joints; non-tender to palpation; no edema; normal capillary refill; no cyanosis, no calf tenderness or swelling; equal pulses in all 4 extremities SKIN: Normal color for age and race; warm; no rash NEURO: Moves all extremities equally, sensation to light touch intact diffusely, cranial nerves II through XII intact PSYCH: The patient's mood and manner are appropriate. Grooming and personal hygiene are appropriate.  MEDICAL DECISION MAKING: Patient here with chest pain. Multiple risk factors for ACS. She is also hypertensive but states  she is not far from her baseline. Has never had any provocative testing other than echocardiogram. Has history of dilated cardiomyopathy. We'll give aspirin, nitroglycerin.  Will obtain cardiac labs, chest x-ray. EKG shows new nonspecific T-wave flattening/inversions in lateral leads that are new compared to prior. I feel she will need admission.  ED PROGRESS: Blood pressure improving with nitroglycerin but chest pain is not gone. Will give dose of IV morphine. Patient's labs are unremarkable. Troponin negative. BNP is only mildly elevated at 184. Glucose is 159. Creatinine normal. Urine shows no proteinuria, hematuria. Chest x-ray clear without cardiomegaly or widened mediastinum.  12:45 AM  patient is now chest pain-free after IV morphine. Discussed patient's case with hospitalist, Dr. Shanon Brow.  Recommend admission to telemetry, observation bed.  I will place holding orders per their request. Patient and family (if present) updated with plan. Care transferred to hospitalist service.  Patient's blood pressure continues to improve.  I reviewed all  nursing notes, vitals, pertinent old records, EKGs, labs, imaging (as available).    EKG Interpretation  Date/Time:  Thursday September 04 2015 22:55:32 EDT Ventricular Rate:  90 PR Interval:  152 QRS Duration: 88 QT Interval:  405 QTC Calculation: 496 R Axis:   26 Text Interpretation:  Sinus rhythm Nonspecific repol abnormality, lateral leads that is new compared to prior Confirmed by Bastian Andreoli,  DO, Journi Moffa [phone]) on 09/04/2015 11:08:30 PM      I personally performed the services described in this documentation, which was scribed in my presence. The recorded information has been reviewed and is accurate.   [place], DO 09/05/15 0206

## 2015-09-05 ENCOUNTER — Encounter (HOSPITAL_COMMUNITY): Payer: Medicaid Other

## 2015-09-05 ENCOUNTER — Observation Stay (HOSPITAL_BASED_OUTPATIENT_CLINIC_OR_DEPARTMENT_OTHER): Payer: Medicaid Other

## 2015-09-05 ENCOUNTER — Encounter (HOSPITAL_COMMUNITY): Payer: Self-pay | Admitting: *Deleted

## 2015-09-05 DIAGNOSIS — I160 Hypertensive urgency: Secondary | ICD-10-CM | POA: Diagnosis not present

## 2015-09-05 DIAGNOSIS — R079 Chest pain, unspecified: Secondary | ICD-10-CM | POA: Diagnosis present

## 2015-09-05 DIAGNOSIS — I509 Heart failure, unspecified: Secondary | ICD-10-CM | POA: Diagnosis not present

## 2015-09-05 DIAGNOSIS — G4730 Sleep apnea, unspecified: Secondary | ICD-10-CM | POA: Diagnosis not present

## 2015-09-05 DIAGNOSIS — I5022 Chronic systolic (congestive) heart failure: Secondary | ICD-10-CM | POA: Diagnosis present

## 2015-09-05 DIAGNOSIS — E1165 Type 2 diabetes mellitus with hyperglycemia: Secondary | ICD-10-CM | POA: Diagnosis not present

## 2015-09-05 DIAGNOSIS — I110 Hypertensive heart disease with heart failure: Secondary | ICD-10-CM | POA: Diagnosis not present

## 2015-09-05 DIAGNOSIS — E119 Type 2 diabetes mellitus without complications: Secondary | ICD-10-CM

## 2015-09-05 LAB — URINALYSIS, ROUTINE W REFLEX MICROSCOPIC
Bilirubin Urine: NEGATIVE
GLUCOSE, UA: NEGATIVE mg/dL
HGB URINE DIPSTICK: NEGATIVE
KETONES UR: NEGATIVE mg/dL
LEUKOCYTES UA: NEGATIVE
Nitrite: NEGATIVE
PROTEIN: NEGATIVE mg/dL
Specific Gravity, Urine: 1.025 (ref 1.005–1.030)
pH: 5.5 (ref 5.0–8.0)

## 2015-09-05 LAB — BASIC METABOLIC PANEL
ANION GAP: 13 (ref 5–15)
BUN: 14 mg/dL (ref 6–20)
CHLORIDE: 98 mmol/L — AB (ref 101–111)
CO2: 26 mmol/L (ref 22–32)
Calcium: 8.5 mg/dL — ABNORMAL LOW (ref 8.9–10.3)
Creatinine, Ser: 0.79 mg/dL (ref 0.44–1.00)
GFR calc non Af Amer: >60 mL/min (ref >60)
Glucose, Bld: 163 mg/dL — ABNORMAL HIGH (ref 65–99)
POTASSIUM: 3.5 mmol/L (ref 3.5–5.1)
SODIUM: 137 mmol/L (ref 135–145)

## 2015-09-05 LAB — TROPONIN I: Troponin I: <0.03 ng/mL (ref <0.031)

## 2015-09-05 LAB — GLUCOSE, CAPILLARY
GLUCOSE-CAPILLARY: 144 mg/dL — AB (ref 65–99)
GLUCOSE-CAPILLARY: 168 mg/dL — AB (ref 65–99)
GLUCOSE-CAPILLARY: 231 mg/dL — AB (ref 65–99)
Glucose-Capillary: 215 mg/dL — ABNORMAL HIGH (ref 65–99)
Glucose-Capillary: 162 mg/dL — ABNORMAL HIGH (ref 65–99)

## 2015-09-05 LAB — ECHOCARDIOGRAM COMPLETE
HEIGHTINCHES: 66 in
WEIGHTICAEL: 3680 [oz_av]

## 2015-09-05 MED ORDER — MORPHINE SULFATE (PF) 4 MG/ML IV SOLN
4.0000 mg | Freq: Once | INTRAVENOUS | Status: AC
  Administered 2015-09-05: 4 mg via INTRAVENOUS
  Filled 2015-09-05: qty 1

## 2015-09-05 MED ORDER — TORSEMIDE 20 MG PO TABS
20.0000 mg | ORAL_TABLET | Freq: Every day | ORAL | Status: DC
  Administered 2015-09-05: 20 mg via ORAL
  Filled 2015-09-05: qty 1

## 2015-09-05 MED ORDER — POTASSIUM CHLORIDE CRYS ER 20 MEQ PO TBCR
20.0000 meq | EXTENDED_RELEASE_TABLET | Freq: Two times a day (BID) | ORAL | Status: DC
Start: 2015-09-05 — End: 2015-09-05
  Administered 2015-09-05: 20 meq via ORAL
  Filled 2015-09-05 (×2): qty 1

## 2015-09-05 MED ORDER — ONDANSETRON HCL 4 MG/2ML IJ SOLN
4.0000 mg | Freq: Once | INTRAMUSCULAR | Status: AC
  Administered 2015-09-05: 4 mg via INTRAVENOUS
  Filled 2015-09-05: qty 2

## 2015-09-05 MED ORDER — SACUBITRIL-VALSARTAN 49-51 MG PO TABS: 1.0000 | ORAL_TABLET | Freq: Two times a day (BID) | ORAL | Status: DC

## 2015-09-05 MED ORDER — ASPIRIN EC 325 MG PO TBEC
325.0000 mg | DELAYED_RELEASE_TABLET | Freq: Every day | ORAL | Status: DC
  Filled 2015-09-05: qty 1

## 2015-09-05 MED ORDER — POTASSIUM CHLORIDE CRYS ER 20 MEQ PO TBCR
40.0000 meq | EXTENDED_RELEASE_TABLET | Freq: Every day | ORAL | Status: DC
  Administered 2015-09-05: 40 meq via ORAL
  Filled 2015-09-05: qty 2

## 2015-09-05 MED ORDER — OXYCODONE-ACETAMINOPHEN 5-325 MG PO TABS
1.0000 | ORAL_TABLET | Freq: Four times a day (QID) | ORAL | Status: DC | PRN
  Administered 2015-09-05: 2 via ORAL
  Filled 2015-09-05: qty 2

## 2015-09-05 MED ORDER — INSULIN ASPART 100 UNIT/ML ~~LOC~~ SOLN
0.0000 [IU] | Freq: Three times a day (TID) | SUBCUTANEOUS | Status: DC
  Administered 2015-09-05: 3 [IU] via SUBCUTANEOUS
  Administered 2015-09-05: 1 [IU] via SUBCUTANEOUS
  Administered 2015-09-05: 2 [IU] via SUBCUTANEOUS

## 2015-09-05 MED ORDER — POTASSIUM CHLORIDE CRYS ER 20 MEQ PO TBCR
20.0000 meq | EXTENDED_RELEASE_TABLET | Freq: Once | ORAL | Status: AC
  Administered 2015-09-05: 20 meq via ORAL
  Filled 2015-09-05: qty 1

## 2015-09-05 MED ORDER — SPIRONOLACTONE 25 MG PO TABS
25.0000 mg | ORAL_TABLET | Freq: Every day | ORAL | Status: DC
  Administered 2015-09-05: 25 mg via ORAL
  Filled 2015-09-05: qty 1

## 2015-09-05 MED ORDER — CITALOPRAM HYDROBROMIDE 20 MG PO TABS
20.0000 mg | ORAL_TABLET | Freq: Every day | ORAL | Status: DC
  Administered 2015-09-05: 20 mg via ORAL
  Filled 2015-09-05: qty 1

## 2015-09-05 MED ORDER — ASPIRIN EC 81 MG PO TBEC
81.0000 mg | DELAYED_RELEASE_TABLET | Freq: Every day | ORAL | Status: DC
  Administered 2015-09-05: 81 mg via ORAL
  Filled 2015-09-05: qty 1

## 2015-09-05 MED ORDER — ACETAMINOPHEN 325 MG PO TABS
650.0000 mg | ORAL_TABLET | ORAL | Status: DC | PRN
  Administered 2015-09-05: 650 mg via ORAL
  Filled 2015-09-05: qty 2

## 2015-09-05 MED ORDER — CARVEDILOL 12.5 MG PO TABS
37.5000 mg | ORAL_TABLET | Freq: Two times a day (BID) | ORAL | Status: DC
  Administered 2015-09-05: 37.5 mg via ORAL
  Filled 2015-09-05: qty 3

## 2015-09-05 MED ORDER — ISOSORB DINITRATE-HYDRALAZINE 20-37.5 MG PO TABS
1.0000 | ORAL_TABLET | Freq: Three times a day (TID) | ORAL | Status: DC
  Administered 2015-09-05 (×2): 1 via ORAL
  Filled 2015-09-05 (×10): qty 1

## 2015-09-05 MED ORDER — NITROGLYCERIN 0.4 MG SL SUBL: 0.4000 mg | SUBLINGUAL_TABLET | SUBLINGUAL | Status: DC | PRN

## 2015-09-05 MED ORDER — SACUBITRIL-VALSARTAN 49-51 MG PO TABS
1.0000 | ORAL_TABLET | Freq: Two times a day (BID) | ORAL | Status: DC
  Administered 2015-09-05: 1 via ORAL
  Filled 2015-09-05 (×7): qty 1

## 2015-09-05 MED ORDER — PREGABALIN 75 MG PO CAPS
75.0000 mg | ORAL_CAPSULE | Freq: Every day | ORAL | Status: DC
  Administered 2015-09-05: 75 mg via ORAL
  Filled 2015-09-05: qty 1

## 2015-09-05 MED ORDER — AMLODIPINE BESYLATE 5 MG PO TABS
10.0000 mg | ORAL_TABLET | Freq: Every day | ORAL | Status: DC
  Administered 2015-09-05: 10 mg via ORAL
  Filled 2015-09-05: qty 2

## 2015-09-05 MED ORDER — ONDANSETRON HCL 4 MG/2ML IJ SOLN
4.0000 mg | Freq: Four times a day (QID) | INTRAMUSCULAR | Status: DC | PRN
  Administered 2015-09-05: 4 mg via INTRAVENOUS
  Filled 2015-09-05: qty 2

## 2015-09-05 NOTE — Discharge Summary (Signed)
Physician Discharge Summary  Chelsey Santiago [id number] DOB: 1976/02/16 DOA: 09/04/2015  PCP: Elyn Peers, MD  Admit date: 09/04/2015 Discharge date: 09/05/2015  Time spent: 30 minutes  Recommendations for Outpatient Follow-up:  1. Follow up with PCP with in 1-2 weeks.  2. Patient will be followed up by cardiology next week for stress test   Discharge Diagnoses:  Principal Problem:   Chest pain Active Problems:   Sleep apnea   Hypertensive urgency   DM type 2 (diabetes mellitus, type 2) (HCC)   Systolic CHF, chronic ([place])   Discharge Condition: Improved  Diet recommendation: Heart healthy   Filed Weights   09/04/15 2240  Weight: 104.327 kg (230 lb)    History of present illness:  26 yof with past medical history of chronic chf with EF 30% and inferior wall abnormalities on ECHO in 2015, DM, medical noncompliance  Presented with complaints of waxing and waning chest pain central chest pain that radiates across left chest and last for over 20 minutes. Pain noted to be relieved with NTG and morphine. Patient referred for admission for chest pain and ACS rule out.   Hospital Course:  Patient was admitted for chest pain, with typical symptoms concerning for underlying CAD. She was placed on NTG paste and ASA daily. EKG without acute changes and troponin remained negative. Cardiology has evaluated and felt that chest pain was likely related to severe hypertension. Her antihypertensive/cardiac medications were adjusted (entresto dose increased) and she will receive prescription for nitroglycerin to take at home if needed. She will follow up with cardiology next week for stress testing. The remainder of her medical issues remained stable. She is feeling significantly improved and is ready for discharge home. Blood pressures also improved.   Procedures: Echo: - Left ventricle: The cavity size was normal. Wall thickness was  increased in a pattern of severe LVH. Systolic function  was  normal. The estimated ejection fraction was in the range of 50%  to 55%. Wall motion was normal; there were no regional wall  motion abnormalities. Doppler parameters are consistent with  abnormal left ventricular relaxation (grade 1 diastolic  dysfunction). - Aortic valve: Valve area (VTI): 2.45 cm^2. Valve area (Vmax):   2.34 cm^2.   Consultations:  Cardiology   Discharge Exam: Filed Vitals:   09/05/15 1456 09/05/15 1556  BP: 116/65 135/76  Pulse: 85 85  Temp:  97.6 F (36.4 C)  Resp:  20   General: NAD, looks comfortable Cardiovascular: RRR, S1, S2  Respiratory: clear bilaterally, No wheezing, rales or rhonchi Abdomen: soft, non tender, no distention , bowel sounds normal Musculoskeletal: No edema b/l  Discharge Instructions   Discharge Instructions    Diet - low sodium heart healthy    Complete by:  As directed      Increase activity slowly    Complete by:  As directed           Current Discharge Medication List    START taking these medications   Details  nitroGLYCERIN (NITROSTAT) 0.4 MG SL tablet Place 1 tablet (0.4 mg total) under the tongue every 5 (five) minutes as needed for chest pain. Qty: 30 tablet, Refills: 12    sacubitril-valsartan (ENTRESTO) 49-51 MG Take 1 tablet by mouth 2 (two) times daily. Qty: 60 tablet, Refills: 1      CONTINUE these medications which have NOT CHANGED   Details  amLODipine (NORVASC) 5 MG tablet Take 2 tablets (10 mg total) by mouth daily. Qty: 60 tablet, Refills:  3    aspirin EC 81 MG tablet Take 81 mg by mouth daily.    carvedilol (COREG) 12.5 MG tablet TAKE 3 TABLETS BY MOUTH TWICE DAILY WITH A MEAL. Qty: 90 tablet, Refills: 6    citalopram (CELEXA) 20 MG tablet Take 20 mg by mouth daily.    isosorbide-hydrALAZINE (BIDIL) 20-37.5 MG tablet Take 1 tablet by mouth 3 (three) times daily.    metFORMIN (GLUCOPHAGE) 500 MG tablet Take 500 mg by mouth 2 (two) times daily with a meal.    potassium  chloride SA (K-DUR,KLOR-CON) 20 MEQ tablet Take 2 tablets (40 mEq total) by mouth daily. Qty: 30 tablet, Refills: 6    pregabalin (LYRICA) 75 MG capsule Take 75 mg by mouth daily.    ranitidine (ZANTAC) 150 MG tablet Take 150 mg by mouth daily as needed for heartburn.    sitaGLIPtin (JANUVIA) 100 MG tablet Take 100 mg by mouth daily.    spironolactone (ALDACTONE) 25 MG tablet Take 1 tablet (25 mg total) by mouth daily. Qty: 30 tablet, Refills: 3    torsemide (DEMADEX) 20 MG tablet Take 1 tablet (20 mg total) by mouth daily. Qty: 30 tablet, Refills: 3      STOP taking these medications     sacubitril-valsartan (ENTRESTO) 24-26 MG        Allergies  Allergen Reactions  . Diclofenac     Edema  . Tramadol Nausea And Vomiting  . Vicodin [Hydrocodone-Acetaminophen] Nausea Only   Follow-up Information    Follow up with [facility] On 09/09/2015.   Specialty:  Cardiac Rehabilitation   Why:  For Stress Test Tuesday April 25th register at 10:00am in Radiology Hold Coreg and nothing to eat after midnight April 24th.   Contact information:   [address] [id number] [place] [phone]       The results of significant diagnostics from this hospitalization (including imaging, microbiology, ancillary and laboratory) are listed below for reference.    Significant Diagnostic Studies: Dg Chest 2 View  09/05/2015  CLINICAL DATA:  Hyperglycemia today with waxing and waning chest pain under the left breast radiating towards the back for 2 days. EXAM: CHEST  2 VIEW COMPARISON:  04/22/2014 FINDINGS: The heart size and mediastinal contours are within normal limits. Both lungs are clear. The visualized skeletal structures are unremarkable. IMPRESSION: No active cardiopulmonary disease. Electronically Signed   By: Lucienne Capers M.D.   On: 09/05/2015 00:27    Microbiology: No results found for this or any previous visit (from the past  240 hour(s)).   Labs: Basic Metabolic Panel:  Recent Labs Lab 09/04/15 2320 09/05/15 0657  NA 137 137  K 3.0* 3.5  CL 98* 98*  CO2 26 26  GLUCOSE 167* 163*  BUN 12 14  CREATININE 0.85 0.79  CALCIUM 8.7* 8.5*   Liver Function Tests:  Recent Labs Lab 09/04/15 2320  AST 19  ALT 15  ALKPHOS 79  BILITOT 0.4  PROT 7.8  ALBUMIN 3.6   CBC:  Recent Labs Lab 09/04/15 2320  WBC 9.2  NEUTROABS 6.3  HGB 10.0*  HCT 32.0*  MCV 71.7*  PLT 263   Cardiac Enzymes:  Recent Labs Lab 09/04/15 2320 09/05/15 0129 09/05/15 0353 09/05/15 0647  TROPONINI <0.03 <0.03 <0.03 <0.03   BNP: BNP (last 3 results)  Recent Labs  09/04/15 2320  BNP 184.0*    CBG:  Recent Labs Lab 09/04/15 2248 09/05/15 0137 09/05/15 0813 09/05/15 1202 09/05/15 1350  GLUCAP 159* 215* 162* 144* 168*    Signed: Kathie Dike, MD  [facility] 09/05/2015, 4:56 PM

## 2015-09-05 NOTE — Care Management Note (Signed)
Case Management Note  Patient Details  Name: Chelsey Santiago MRN: [id number] Date of Birth: 12-Apr-1976  Subjective/Objective:                  Pt admitted for r/o CP. Pt is from home, lives with her children and is ind with ADL's. Pt has PCP, transportation and is able to afford her medications. Pt asking about aid for home assistance. Pt instructed to call her DSS case worker to ask about options. Pt plans to return home with self care at DC.   Action/Plan: No CM needs.   Expected Discharge Date:    09/05/2015              Expected Discharge Plan:  Home/Self Care  In-House Referral:  NA  Discharge planning Services  CM Consult  Post Acute Care Choice:  NA Choice offered to:  NA  DME Arranged:    DME Agency:     HH Arranged:    HH Agency:     Status of Service:  Completed, signed off  Medicare Important Message Given:    Date Medicare IM Given:    Medicare IM give by:    Date Additional Medicare IM Given:    Additional Medicare Important Message give by:     If discussed at [place] of Stay Meetings, dates discussed:    Additional Comments:  Sherald Barge, RN 09/05/2015, 3:02 PM

## 2015-09-05 NOTE — Progress Notes (Signed)
Pt's IV catheter removed and intact. Pt's IV site clean dry and intact. Discharge instructions including medications and follow up appointments reviewed and discussed with patient. Pt verbalized understanding of discharge instructions including medications and follow up appointments. All questions were answered and no further questions at this time. Pt in stable condition and in no acute distress at this time. Pt escorted by nurse tech.

## 2015-09-05 NOTE — Progress Notes (Signed)
*  PRELIMINARY RESULTS* Echocardiogram 2D Echocardiogram has been performed.  Chelsey Santiago 09/05/2015, 3:07 PM

## 2015-09-05 NOTE — H&P (Signed)
PCP:   Elyn Peers, MD   Chief Complaint:  Chest pain  HPI: 40 yo female h/o chf systolic (hypertensive CM vs poss postpartum CM) with EF 30% and inferior wall abnormalities on echo in 2015, DM, medical noncompliance comes in with 2 days of several episodes of waxing/waning sscp that radiates across left chest lasting over 20 minutes, has been being relieved with rest and worse with exertion.  Pt today the pain lasted longer so came to the ED, it was relieved with ntg given to her in the ED and morphine.  Pt chest pain free now.  No swelling.  No cough.  No fevers.  Pt last echo was 2015 however one was ordered by her cardiologist a couple of months ago but has not been done.  No cardiac cath or stress testing found in epic.  Pt referred for admission for chest pain concernign for ACS.  Review of Systems:  Positive and negative as per HPI otherwise all other systems are negative  Past Medical History: Past Medical History  Diagnosis Date  . Hypertension     Lab: Normal BMet except glucose of 118 in 09/2010  . Hypertensive heart disease 2009    Pulmonary edema postpartum; mild to moderate mitral regurgitation when hospitalized for CHF in 2009; Echocardiogram in 12/2009-no MR and normal EF; normal CXR in 09/2010  . Migraine headache   . Anemia     H&H of 10.6/33 and 07/2008 and 11.9/35 and 09/2010  . Osteoarthritis, knee 03/29/2011  . Sleep apnea   . Depression with anxiety   . Fasting hyperglycemia   . Obesity 04/16/2009  . CHF (congestive heart failure) ([place])   . Pregnant   . Diabetes mellitus without complication ([place])   . Anxiety   . Enlarged heart   . Pulmonary edema   . Preeclampsia   . Threatened abortion in early pregnancy 03/15/2013  . Miscarriage 03/19/2013   Past Surgical History  Procedure Laterality Date  . Breast reduction surgery  2002  . Cesarean section N/A 04/09/2014    Procedure: CESAREAN SECTION;  Surgeon: Mora Bellman, MD;  Location: [facility];  Service:  Obstetrics;  Laterality: N/A;  . Cholecystectomy      Medications: Prior to Admission medications   Medication Sig Start Date End Date Taking? Authorizing Provider  amLODipine (NORVASC) 5 MG tablet Take 2 tablets (10 mg total) by mouth daily. 02/24/15  Yes Lendon Colonel, NP  aspirin EC 81 MG tablet Take 81 mg by mouth daily.   Yes Historical Provider, MD  carvedilol (COREG) 12.5 MG tablet TAKE 3 TABLETS BY MOUTH TWICE DAILY WITH A MEAL. 08/21/15  Yes Arnoldo Lenis, MD  citalopram (CELEXA) 20 MG tablet Take 20 mg by mouth daily.   Yes Historical Provider, MD  isosorbide-hydrALAZINE (BIDIL) 20-37.5 MG tablet Take 1 tablet by mouth 3 (three) times daily.   Yes Historical Provider, MD  metFORMIN (GLUCOPHAGE) 500 MG tablet Take 500 mg by mouth 2 (two) times daily with a meal.   Yes Historical Provider, MD  potassium chloride SA (K-DUR,KLOR-CON) 20 MEQ tablet Take 2 tablets (40 mEq total) by mouth daily. 03/27/15  Yes Lendon Colonel, NP  pregabalin (LYRICA) 75 MG capsule Take 75 mg by mouth daily.   Yes Historical Provider, MD  ranitidine (ZANTAC) 150 MG tablet Take 150 mg by mouth daily as needed for heartburn.   Yes Historical Provider, MD  sacubitril-valsartan (ENTRESTO) 24-26 MG Take 1 tablet by mouth 2 (two) times daily.  06/27/15  Yes Arnoldo Lenis, MD  sitaGLIPtin (JANUVIA) 100 MG tablet Take 100 mg by mouth daily.   Yes Historical Provider, MD  spironolactone (ALDACTONE) 25 MG tablet Take 1 tablet (25 mg total) by mouth daily. 02/24/15  Yes Lendon Colonel, NP  torsemide (DEMADEX) 20 MG tablet Take 1 tablet (20 mg total) by mouth daily. 02/24/15  Yes Lendon Colonel, NP    Allergies:   Allergies  Allergen Reactions  . Diclofenac     Edema  . Tramadol Nausea And Vomiting  . Vicodin [Hydrocodone-Acetaminophen] Nausea Only    Social History:  reports that she has never smoked. She has never used smokeless tobacco. She reports that she does not drink alcohol or use  illicit drugs.  Family History: Family History  Problem Relation Age of Onset  . Diabetes Mother   . Heart disease Mother   . Hypertension Maternal Uncle   . Hyperlipidemia Paternal Grandfather   . Hypertension Paternal Grandfather   . Sudden death Neg Hx   . Heart disease Father   . Heart disease Maternal Grandmother   . Heart attack Brother   chf in her mom in her 55s  Physical Exam: Filed Vitals:   09/04/15 2351 09/05/15 0000 09/05/15 0030 09/05/15 0100  BP: 152/96 138/84 140/92 131/80  Pulse: 100 97 92 90  Temp:      TempSrc:      Resp: 16 17 19 23   Height:      Weight:      SpO2: 99% 99% 99% 98%   General appearance: alert, cooperative and no distress Head: Normocephalic, without obvious abnormality, atraumatic Eyes: negative Nose: Nares normal. Septum midline. Mucosa normal. No drainage or sinus tenderness. Neck: no JVD and supple, symmetrical, trachea midline Lungs: clear to auscultation bilaterally Heart: regular rate and rhythm, S1, S2 normal, no murmur, click, rub or gallop Abdomen: soft, non-tender; bowel sounds normal; no masses,  no organomegaly Extremities: extremities normal, atraumatic, no cyanosis or edema Pulses: 2+ and symmetric Skin: Skin color, texture, turgor normal. No rashes or lesions Neurologic: Grossly normal    Labs on Admission:   Recent Labs  09/04/15 2320  NA 137  K 3.0*  CL 98*  CO2 26  GLUCOSE 167*  BUN 12  CREATININE 0.85  CALCIUM 8.7*    Recent Labs  09/04/15 2320  AST 19  ALT 15  ALKPHOS 79  BILITOT 0.4  PROT 7.8  ALBUMIN 3.6    Recent Labs  09/04/15 2320  WBC 9.2  NEUTROABS 6.3  HGB 10.0*  HCT 32.0*  MCV 71.7*  PLT 263    Recent Labs  09/04/15 2320  TROPONINI <0.03   Radiological Exams on Admission: Dg Chest 2 View  09/05/2015  CLINICAL DATA:  Hyperglycemia today with waxing and waning chest pain under the left breast radiating towards the back for 2 days. EXAM: CHEST  2 VIEW COMPARISON:   04/22/2014 FINDINGS: The heart size and mediastinal contours are within normal limits. Both lungs are clear. The visualized skeletal structures are unremarkable. IMPRESSION: No active cardiopulmonary disease. Electronically Signed   By: Lucienne Capers M.D.   On: 09/05/2015 00:27   ekg reviewed nsr lateral flipped twaves  Assessment/Plan  40 yo female with typical symptoms concerning for underlying CAD  Principal Problem:   Chest pain- ekg nonischemic and initial trop neg.  Does not appear pt has had ischemic evaluation.  Will consult cardiology to make recommendations on further testing, stress vs cath.  Romi.  Check cardiac echo in am.  Place on aspirin daily.  If cp returns place on ntg paste.  It trop bumps start full dose lovenox.  Active Problems:   Hypertensive urgency- bp better after morphine and ntg given   Sleep apnea- noted   DM type 2 (diabetes mellitus, type 2) ([place])- ssi   Systolic CHF, chronic ([place])- stable and compensated at this time, last echo in 2015, repeating now  obs on tele.  Full code.  Xzaviar Maloof A 09/05/2015, 1:08 AM

## 2015-09-05 NOTE — ED Notes (Signed)
Pt reports no chest pain, continues to have burning in feet

## 2015-09-05 NOTE — Consult Note (Addendum)
CARDIOLOGY CONSULT NOTE   Patient ID: WESTLYNN VOWELS MRN: [id number] DOB/AGE: 1976/05/02 40 y.o.  Admit Date: 09/04/2015 Referring Physician: Gwynneth Albright MD Primary Physician: Elyn Peers, MD Consulting Cardiologist: Carlyle Dolly MD Primary Cardiologist: Carlyle Dolly MD Reason for Consultation: CHF  Clinical Summary [name] is a 40 y.o.female with known history of chronic systolic heart failure, chronic decompensations, most recent echocardiogram with an EF of 30% and inferior wall abnormalities, diabetes, chronic medical and dietary noncompliance. The patient also has recurrent chest discomfort. Frequent evaluations in ER.   She states that she had eaten some salty foods earlier in the week, to include tacos. She was not taking potassium regularly. She had taken her son to visit his physician for ADHD and began to have some diaphoresis and weakness. It was felt that her blood glucose might have been low and she was given a Coca-Cola and some peanuts.. When she got home she checked her sugar and found it to be elevated in the 400s. She immediately took her medications as she had not taken them yet that day. The patient began to have some chest discomfort and palpitations thereafter, presented to the emergency room.  On arrival patient blood pressure was 206/114 heart rate 99 O2 sat 99% she was afebrile. Hemoglobin of 10 hematocrit 32.0 potassium 3.0 with a glucose of 167 creatinine 0.85. Mildly elevated BNP at 184.0. Troponins have been negative. Chest x-ray revealed no active cardiopulmonary disease no evidence of CHF. EKG revealed normal sinus rhythm with nonspecific ST changes in the lateral leads. The patient was treated with aspirin, nitroglycerin, and morphine and admitted to rule out ACS. There was no potassium supplementation given in ER, but she has since been placed on this during hospitalization.    Allergies  Allergen Reactions  . Diclofenac     Edema  .  Tramadol Nausea And Vomiting  . Vicodin [Hydrocodone-Acetaminophen] Nausea Only    Medications Scheduled Medications: . amLODipine  10 mg Oral Daily  . aspirin EC  325 mg Oral Daily  . aspirin EC  81 mg Oral Daily  . citalopram  20 mg Oral Daily  . insulin aspart  0-9 Units Subcutaneous TID WC  . isosorbide-hydrALAZINE  1 tablet Oral TID  . potassium chloride  20 mEq Oral Once  . potassium chloride SA  40 mEq Oral Daily  . pregabalin  75 mg Oral Daily  . spironolactone  25 mg Oral Daily  . torsemide  20 mg Oral Daily    Infusions:    PRN Medications: acetaminophen, ondansetron (ZOFRAN) IV   Past Medical History  Diagnosis Date  . Hypertension     Lab: Normal BMet except glucose of 118 in 09/2010  . Hypertensive heart disease 2009    Pulmonary edema postpartum; mild to moderate mitral regurgitation when hospitalized for CHF in 2009; Echocardiogram in 12/2009-no MR and normal EF; normal CXR in 09/2010  . Migraine headache   . Anemia     H&H of 10.6/33 and 07/2008 and 11.9/35 and 09/2010  . Osteoarthritis, knee 03/29/2011  . Sleep apnea   . Depression with anxiety   . Fasting hyperglycemia   . Obesity 04/16/2009  . CHF (congestive heart failure) ([place])   . Pregnant   . Diabetes mellitus without complication ([place])   . Anxiety   . Enlarged heart   . Pulmonary edema   . Preeclampsia   . Threatened abortion in early pregnancy 03/15/2013  . Miscarriage 03/19/2013    Past Surgical  History  Procedure Laterality Date  . Breast reduction surgery  2002  . Cesarean section N/A 04/09/2014    Procedure: CESAREAN SECTION;  Surgeon: Mora Bellman, MD;  Location: [facility];  Service: Obstetrics;  Laterality: N/A;  . Cholecystectomy      Family History  Problem Relation Age of Onset  . Diabetes Mother   . Heart disease Mother   . Hypertension Maternal Uncle   . Hyperlipidemia Paternal Grandfather   . Hypertension Paternal Grandfather   . Sudden death Neg Hx   . Heart disease  Father   . Heart disease Maternal Grandmother   . Heart attack Brother     Social History [name] reports that she has never smoked. She has never used smokeless tobacco. [name] reports that she does not drink alcohol.  Review of Systems Complete review of systems are found to be negative unless outlined in H&P above.  Physical Examination Blood pressure 147/85, pulse 91, temperature 97.5 F (36.4 C), temperature source Oral, resp. rate 20, height 5\' 6"  (1.676 m), weight 230 lb (104.327 kg), last menstrual period 08/22/2015, SpO2 100 %, unknown if currently breastfeeding.  Intake/Output Summary (Last 24 hours) at 09/05/15 0951 Last data filed at 09/05/15 0800  Gross per 24 hour  Intake    240 ml  Output      0 ml  Net    240 ml    Telemetry: Normal sinus rhythm.  GEN: HEENT: Conjunctiva and lids normal, oropharynx clear with moist mucosa. Neck: Supple, no elevated JVP or carotid bruits, no thyromegaly. Lungs: Clear to auscultation, nonlabored breathing at rest. Cardiac: Regular rate and rhythm, no S3 or significant systolic murmur, no pericardial rub. Abdomen: Soft, nontender, no hepatomegaly, bowel sounds present, no guarding or rebound. Extremities: No pitting edema, distal pulses 2+. Skin: Warm and dry. Musculoskeletal: No kyphosis. Neuropsychiatric: Alert and oriented x3, affect grossly appropriate.  Prior Cardiac Testing/Procedures  11/06/2014 Echocardiogram   - Left ventricle: The cavity size was normal. There was moderate  concentric hypertrophy. Systolic function was moderately reduced.  The estimated ejection fraction was in the range of 35% to 40%.  Images were suboptimal for LV wall motion assessment. There  appeared to be global hypokinesis. Doppler parameters are  consistent with abnormal left ventricular relaxation (grade 1  diastolic dysfunction). - Mitral valve: There was trivial regurgitation. - Left atrium: The atrium was mildly  dilated.  Lab Results  Basic Metabolic Panel:  Recent Labs Lab 09/04/15 2320  NA 137  K 3.0*  CL 98*  CO2 26  GLUCOSE 167*  BUN 12  CREATININE 0.85  CALCIUM 8.7*    Liver Function Tests:  Recent Labs Lab 09/04/15 2320  AST 19  ALT 15  ALKPHOS 79  BILITOT 0.4  PROT 7.8  ALBUMIN 3.6    CBC:  Recent Labs Lab 09/04/15 2320  WBC 9.2  NEUTROABS 6.3  HGB 10.0*  HCT 32.0*  MCV 71.7*  PLT 263    Cardiac Enzymes:  Recent Labs Lab 09/04/15 2320 09/05/15 0129 09/05/15 0353 09/05/15 0647  TROPONINI <0.03 <0.03 <0.03 <0.03    BNP: Invalid input(s): POCBNP   Radiology: Dg Chest 2 View  09/05/2015  CLINICAL DATA:  Hyperglycemia today with waxing and waning chest pain under the left breast radiating towards the back for 2 days. EXAM: CHEST  2 VIEW COMPARISON:  04/22/2014 FINDINGS: The heart size and mediastinal contours are within normal limits. Both lungs are clear. The visualized skeletal structures are unremarkable. IMPRESSION:  No active cardiopulmonary disease. Electronically Signed   By: Lucienne Capers M.D.   On: 09/05/2015 00:27     ECG: Normal sinus rhythm with nonspecific lateral T-wave abnormalities.   Impression and Recommendations  1. Hypertensive emergency: Likely from salty food intake, and inconsistency on taking antihypertensives at the same time every day. Now normalized since admission. Recommendations to take medications on time every day at the same time every day and avoid salty foods.  2. Chest pain:Troponins and EKG are negative ruling out ACS.   3. Hypertensive cardiomyopathy: Most recent echocardiogram 2016 revealed improvement in LV systolic function to [id number]. Continue BilDil, amlodipine and spironolactone.  4. History of chronic systolic CHF: No evidence of decompensated heart failure on examination or by chest x-ray. Would continue torsemide as directed. Avoid salty foods. Take medications daily as directed.  Signed: Phill Myron. Lawrence NP [place]  09/05/2015, 9:51 AM Co-Sign MD  Patient seen and discussed with NP Purcell Nails, I agree with her documentation. 40 yo female with history of HTN, chronic systolic HF echo [id number], grade I diastolic dysfunction admitted with HTN and chest pain. BP on admission 206/114, pulse 99, 99% or RA.    BNP 184, K 3, Cr 0.85, Hgb 10, Plt 263, trop neg x 3.  EKG SR, nonspecific ST/T changes CXR no acute process  Chest pain likely related to severe HTN, no evidence of ACS by EKG or enzymes. Repeat echo is pending.  Regarding the possible etiology of her systolic dysfunction is unclear. Timing of diagnosis would suggest possible peripartum CM. However she has known longstanding severe HTN with mixed medication compliance, with evidence of severe LA enlargement suggesting more chronic cardiac stress and potential hypertensive CM. Evidence of increased LA pressures and LA enlargement date back as far as 2009 based on studies. We will f/u up her repeat echo, if perstient LV dysfunction would pursue cath as outpatient to evaluate for possible ischemic CM. Titrate up her entresto.   Zandra Abts MD

## 2015-09-06 LAB — HEMOGLOBIN A1C
HEMOGLOBIN A1C: 9.7 % — AB (ref 4.8–5.6)
MEAN PLASMA GLUCOSE: 232 mg/dL

## 2015-09-08 ENCOUNTER — Other Ambulatory Visit: Payer: Self-pay | Admitting: Adult Health

## 2015-09-08 ENCOUNTER — Encounter (HOSPITAL_COMMUNITY): Payer: Medicaid Other

## 2015-09-08 DIAGNOSIS — R0789 Other chest pain: Secondary | ICD-10-CM

## 2015-09-09 ENCOUNTER — Encounter (HOSPITAL_COMMUNITY): Payer: Medicaid Other

## 2015-09-09 ENCOUNTER — Ambulatory Visit (HOSPITAL_COMMUNITY): Admit: 2015-09-09 | Payer: Medicaid Other

## 2015-09-10 ENCOUNTER — Encounter (HOSPITAL_COMMUNITY): Payer: Medicaid Other

## 2015-09-12 ENCOUNTER — Encounter (HOSPITAL_COMMUNITY): Payer: Medicaid Other

## 2015-09-15 ENCOUNTER — Encounter (HOSPITAL_COMMUNITY): Payer: Medicaid Other

## 2015-09-17 ENCOUNTER — Encounter (HOSPITAL_COMMUNITY): Payer: Medicaid Other

## 2015-09-19 ENCOUNTER — Encounter (HOSPITAL_COMMUNITY): Payer: Medicaid Other

## 2015-09-22 ENCOUNTER — Encounter (HOSPITAL_COMMUNITY): Payer: Medicaid Other

## 2015-09-24 ENCOUNTER — Encounter (HOSPITAL_COMMUNITY): Payer: Medicaid Other

## 2015-09-26 ENCOUNTER — Encounter (HOSPITAL_COMMUNITY): Payer: Medicaid Other

## 2015-09-29 ENCOUNTER — Encounter (HOSPITAL_COMMUNITY): Payer: Medicaid Other

## 2015-10-01 ENCOUNTER — Encounter (HOSPITAL_COMMUNITY): Payer: Medicaid Other

## 2015-10-03 ENCOUNTER — Encounter (HOSPITAL_COMMUNITY): Payer: Medicaid Other

## 2015-10-06 ENCOUNTER — Encounter (HOSPITAL_COMMUNITY): Payer: Medicaid Other

## 2015-10-07 ENCOUNTER — Encounter (HOSPITAL_COMMUNITY): Payer: Self-pay | Admitting: *Deleted

## 2015-10-07 ENCOUNTER — Emergency Department (HOSPITAL_COMMUNITY)
Admission: EM | Admit: 2015-10-07 | Discharge: 2015-10-07 | Disposition: A | Discharge status: Home / Self Care | Payer: Medicaid Other | Attending: Emergency Medicine | Admitting: Emergency Medicine

## 2015-10-07 DIAGNOSIS — Z79899 Other long term (current) drug therapy: Secondary | ICD-10-CM | POA: Insufficient documentation

## 2015-10-07 DIAGNOSIS — M179 Osteoarthritis of knee, unspecified: Secondary | ICD-10-CM | POA: Insufficient documentation

## 2015-10-07 DIAGNOSIS — E119 Type 2 diabetes mellitus without complications: Secondary | ICD-10-CM | POA: Diagnosis not present

## 2015-10-07 DIAGNOSIS — F329 Major depressive disorder, single episode, unspecified: Secondary | ICD-10-CM | POA: Diagnosis not present

## 2015-10-07 DIAGNOSIS — R000 Tachycardia, unspecified: Secondary | ICD-10-CM | POA: Diagnosis not present

## 2015-10-07 DIAGNOSIS — K0889 Other specified disorders of teeth and supporting structures: Secondary | ICD-10-CM | POA: Diagnosis not present

## 2015-10-07 DIAGNOSIS — Z7984 Long term (current) use of oral hypoglycemic drugs: Secondary | ICD-10-CM | POA: Diagnosis not present

## 2015-10-07 DIAGNOSIS — I110 Hypertensive heart disease with heart failure: Secondary | ICD-10-CM | POA: Diagnosis not present

## 2015-10-07 DIAGNOSIS — I509 Heart failure, unspecified: Secondary | ICD-10-CM | POA: Insufficient documentation

## 2015-10-07 MED ORDER — PENICILLIN V POTASSIUM 500 MG PO TABS: 500.0000 mg | ORAL_TABLET | Freq: Three times a day (TID) | ORAL | Status: DC

## 2015-10-07 MED ORDER — OXYCODONE-ACETAMINOPHEN 5-325 MG PO TABS
2.0000 | ORAL_TABLET | Freq: Once | ORAL | Status: DC
  Filled 2015-10-07: qty 2

## 2015-10-07 MED ORDER — HYDROCODONE-ACETAMINOPHEN 5-325 MG PO TABS: 1.0000 | ORAL_TABLET | Freq: Four times a day (QID) | ORAL | Status: DC | PRN

## 2015-10-07 MED ORDER — ONDANSETRON 8 MG PO TBDP
8.0000 mg | ORAL_TABLET | Freq: Once | ORAL | Status: AC
  Administered 2015-10-07: 8 mg via ORAL
  Filled 2015-10-07: qty 1

## 2015-10-07 MED ORDER — OXYCODONE-ACETAMINOPHEN 5-325 MG PO TABS
1.0000 | ORAL_TABLET | Freq: Once | ORAL | Status: AC
Start: 2015-10-07 — End: 2015-10-07
  Administered 2015-10-07: 1 via ORAL

## 2015-10-07 MED ORDER — ONDANSETRON 8 MG PO TBDP: 8.0000 mg | ORAL_TABLET | Freq: Three times a day (TID) | ORAL | Status: DC | PRN

## 2015-10-07 NOTE — Discharge Instructions (Signed)
Follow-up with [facility] tomorrow regarding your tooth pain.  Return to the emergency department if you experience difficulty swallowing, difficulty opening your mouth, fever, chills.

## 2015-10-07 NOTE — ED Notes (Signed)
Pt comes in for dental pain starting 3 days ago. Pain is located on left lower jaw. Pt had a dental filling come out several years ago and she has had pain since then. Pt has a dentist but the office is closed today.

## 2015-10-07 NOTE — ED Provider Notes (Signed)
CSN: [id number]     Arrival date & time 10/07/15  [id number] History   First MD Initiated Contact with Patient 10/07/15 [phone]     Chief Complaint  Patient presents with  . Dental Pain     (Consider location/radiation/quality/duration/timing/severity/associated sxs/prior Treatment) HPI   Patient is a 40 year old female with a history of HTN, CHF, diabetes, migraines who presents to the ED with left-sided dental pain for 3 days. Patient states she lost a filling in this tooth roughly a year ago and the pain has been intermittent and well controlled with over-the-counter pain medications. Patient states the pain has been intermittent until last night when it became constant, radiates into her left jaw, 10/10, she has tried over-the-counter pain medications with minimal relief, cold and touch touching the tooth make it worse. She [facility], but they're closed on Tuesdays. She denies ear pain, fever, chills, dysphasia, sore throat, difficulty swallowing, trismus, nausea, vomiting.  Past Medical History  Diagnosis Date  . Hypertension     Lab: Normal BMet except glucose of 118 in 09/2010  . Hypertensive heart disease 2009    Pulmonary edema postpartum; mild to moderate mitral regurgitation when hospitalized for CHF in 2009; Echocardiogram in 12/2009-no MR and normal EF; normal CXR in 09/2010  . Migraine headache   . Anemia     H&H of 10.6/33 and 07/2008 and 11.9/35 and 09/2010  . Osteoarthritis, knee 03/29/2011  . Sleep apnea   . Depression with anxiety   . Fasting hyperglycemia   . Obesity 04/16/2009  . CHF (congestive heart failure) ([place])   . Pregnant   . Diabetes mellitus without complication ([place])   . Anxiety   . Enlarged heart   . Pulmonary edema   . Preeclampsia   . Threatened abortion in early pregnancy 03/15/2013  . Miscarriage 03/19/2013   Past Surgical History  Procedure Laterality Date  . Breast reduction surgery  2002  . Cesarean section N/A  04/09/2014    Procedure: CESAREAN SECTION;  Surgeon: Mora Bellman, MD;  Location: [facility];  Service: Obstetrics;  Laterality: N/A;  . Cholecystectomy     Family History  Problem Relation Age of Onset  . Diabetes Mother   . Heart disease Mother   . Hypertension Maternal Uncle   . Hyperlipidemia Paternal Grandfather   . Hypertension Paternal Grandfather   . Sudden death Neg Hx   . Heart disease Father   . Heart disease Maternal Grandmother   . Heart attack Brother    Social History  Substance Use Topics  . Smoking status: Never Smoker   . Smokeless tobacco: Never Used  . Alcohol Use: No     Comment: occ   OB History    Gravida Para Term Preterm AB TAB SAB Ectopic Multiple Living   11 6 5 1 5 2 3   0 6     Review of Systems  Constitutional: Negative for fever and chills.  HENT: Positive for dental problem. Negative for ear pain, facial swelling, sore throat, trouble swallowing and voice change.   Respiratory: Negative for chest tightness and shortness of breath.   Cardiovascular: Negative for chest pain.  Gastrointestinal: Negative for nausea, vomiting and abdominal pain.  Neurological: Negative for dizziness, syncope and weakness.      Allergies  Diclofenac; Tramadol; and Vicodin  Home Medications   Prior to Admission medications   Medication Sig Start Date End Date Taking? Authorizing Provider  amLODipine (NORVASC) 5 MG tablet Take 2  tablets (10 mg total) by mouth daily. 02/24/15  Yes Lendon Colonel, NP  aspirin EC 81 MG tablet Take 81 mg by mouth daily.   Yes Historical Provider, MD  carvedilol (COREG) 12.5 MG tablet TAKE 3 TABLETS BY MOUTH TWICE DAILY WITH A MEAL. 08/21/15  Yes Arnoldo Lenis, MD  citalopram (CELEXA) 20 MG tablet Take 20 mg by mouth daily.   Yes Historical Provider, MD  isosorbide-hydrALAZINE (BIDIL) 20-37.5 MG tablet Take 1 tablet by mouth 3 (three) times daily.   Yes Historical Provider, MD  metFORMIN (GLUCOPHAGE) 500 MG tablet Take 500 mg  by mouth 2 (two) times daily with a meal.   Yes Historical Provider, MD  nitroGLYCERIN (NITROSTAT) 0.4 MG SL tablet Place 1 tablet (0.4 mg total) under the tongue every 5 (five) minutes as needed for chest pain. 09/05/15  Yes Kathie Dike, MD  potassium chloride SA (K-DUR,KLOR-CON) 20 MEQ tablet Take 2 tablets (40 mEq total) by mouth daily. 03/27/15  Yes Lendon Colonel, NP  pregabalin (LYRICA) 75 MG capsule Take 75 mg by mouth daily.   Yes Historical Provider, MD  ranitidine (ZANTAC) 150 MG tablet Take 150 mg by mouth daily as needed for heartburn.   Yes Historical Provider, MD  sacubitril-valsartan (ENTRESTO) 49-51 MG Take 1 tablet by mouth 2 (two) times daily. 09/05/15  Yes Kathie Dike, MD  sitaGLIPtin (JANUVIA) 100 MG tablet Take 100 mg by mouth daily.   Yes Historical Provider, MD  spironolactone (ALDACTONE) 25 MG tablet Take 1 tablet (25 mg total) by mouth daily. 02/24/15  Yes Lendon Colonel, NP  torsemide (DEMADEX) 20 MG tablet Take 1 tablet (20 mg total) by mouth daily. 02/24/15  Yes Lendon Colonel, NP  HYDROcodone-acetaminophen (NORCO/VICODIN) 5-325 MG tablet Take 1 tablet by mouth every 6 (six) hours as needed. 10/07/15   Kalman Drape, PA  ondansetron (ZOFRAN-ODT) 8 MG disintegrating tablet Take 1 tablet (8 mg total) by mouth every 8 (eight) hours as needed for nausea or vomiting. 10/07/15   Kalman Drape, PA  penicillin v potassium (VEETID) 500 MG tablet Take 1 tablet (500 mg total) by mouth 3 (three) times daily. 10/07/15 10/14/15  Oluwateniola Leitch L Charles Andringa, PA   BP 143/98 mmHg  Pulse 111  Temp(Src) 99.2 F (37.3 C) (Oral)  Resp 16  Ht 5\' 6"  (1.676 m)  Wt 106.595 kg  BMI 37.95 kg/m2  SpO2 99%  LMP 09/21/2015 Physical Exam  Constitutional: She appears well-developed and well-nourished. No distress.  HENT:  Head: Normocephalic and atraumatic.  Mouth/Throat: Uvula is midline, oropharynx is clear and moist and mucous membranes are normal. No oropharyngeal exudate, posterior  oropharyngeal edema, posterior oropharyngeal erythema or tonsillar abscesses.    No trismus on exam, good dentition, examination of gingiva revealed no erythema, no edema, no signs of infection, a hole noted to left lower third molar  Eyes: Conjunctivae are normal.  Cardiovascular: Regular rhythm and normal heart sounds.  Tachycardia present.  Exam reveals no gallop and no friction rub.   No murmur heard. Pulmonary/Chest: Effort normal.  Neurological: She is alert. Coordination normal.    ED Course  Procedures (including critical care time) Labs Review Labs Reviewed - No data to display  Imaging Review No results found. I have personally reviewed and evaluated these images and lab results as part of my medical decision-making.   EKG Interpretation None      MDM   Final diagnoses:  Pain, dental    Patient with toothache likely  2/2 from missing filling. Insisting on IV pain medication, gave her Percocet. No gross abscess.  Exam unconcerning for Ludwig's angina or spread of infection.  Will treat with penicillin, pain medicine, and Zofran for nausea.  Urged patient to follow-up with dentist. Discussed strict return precautions. Patient expressed understanding to the discharge instructions.      Kalman Drape, [place] 10/07/15 [place], MD 10/07/15 [phone]

## 2015-10-07 NOTE — ED Notes (Signed)
Upon discharge pt told nurse that she can't have Vicodin because she is allergic. Nurse explained to her again that this was a side effect and she should take the Zofran prescribed with this. Then pt states that she has itching when she takes this medication. RN told her that she could talk with the pre-scriber about this,. Pt states, "I just want you to give me the papers and shut up. I have been up here all day with no pain relief."

## 2015-10-07 NOTE — ED Notes (Signed)
Pt states that she has allergy to Vicodin. Pt states she has nausea when she takes this. Pt educated that this was a side effect of the medication and not an allergy. PA made aware and is ordering zofran

## 2015-10-08 ENCOUNTER — Encounter (HOSPITAL_COMMUNITY): Payer: Self-pay | Admitting: Emergency Medicine

## 2015-10-08 ENCOUNTER — Emergency Department (HOSPITAL_COMMUNITY)
Admission: EM | Admit: 2015-10-08 | Discharge: 2015-10-08 | Disposition: A | Discharge status: Home / Self Care | Payer: Medicaid Other | Attending: Emergency Medicine | Admitting: Emergency Medicine

## 2015-10-08 ENCOUNTER — Encounter (HOSPITAL_COMMUNITY): Payer: Medicaid Other

## 2015-10-08 DIAGNOSIS — K0889 Other specified disorders of teeth and supporting structures: Secondary | ICD-10-CM | POA: Diagnosis present

## 2015-10-08 DIAGNOSIS — Z6837 Body mass index (BMI) 37.0-37.9, adult: Secondary | ICD-10-CM | POA: Insufficient documentation

## 2015-10-08 DIAGNOSIS — Z7982 Long term (current) use of aspirin: Secondary | ICD-10-CM | POA: Insufficient documentation

## 2015-10-08 DIAGNOSIS — Z7984 Long term (current) use of oral hypoglycemic drugs: Secondary | ICD-10-CM | POA: Insufficient documentation

## 2015-10-08 DIAGNOSIS — F418 Other specified anxiety disorders: Secondary | ICD-10-CM | POA: Insufficient documentation

## 2015-10-08 DIAGNOSIS — E669 Obesity, unspecified: Secondary | ICD-10-CM | POA: Insufficient documentation

## 2015-10-08 DIAGNOSIS — M179 Osteoarthritis of knee, unspecified: Secondary | ICD-10-CM | POA: Diagnosis not present

## 2015-10-08 DIAGNOSIS — Z79899 Other long term (current) drug therapy: Secondary | ICD-10-CM | POA: Diagnosis not present

## 2015-10-08 DIAGNOSIS — I110 Hypertensive heart disease with heart failure: Secondary | ICD-10-CM | POA: Diagnosis not present

## 2015-10-08 DIAGNOSIS — K047 Periapical abscess without sinus: Secondary | ICD-10-CM | POA: Diagnosis not present

## 2015-10-08 DIAGNOSIS — E119 Type 2 diabetes mellitus without complications: Secondary | ICD-10-CM | POA: Diagnosis not present

## 2015-10-08 DIAGNOSIS — I509 Heart failure, unspecified: Secondary | ICD-10-CM | POA: Diagnosis not present

## 2015-10-08 LAB — CBC WITH DIFFERENTIAL/PLATELET
BASOS PCT: 0 %
Basophils Absolute: 0 10*3/uL (ref 0.0–0.1)
EOS ABS: 0 10*3/uL (ref 0.0–0.7)
EOS PCT: 0 %
HCT: 32.1 % — ABNORMAL LOW (ref 36.0–46.0)
Hemoglobin: 9.7 g/dL — ABNORMAL LOW (ref 12.0–15.0)
LYMPHS ABS: 1 10*3/uL (ref 0.7–4.0)
Lymphocytes Relative: 10 %
MCH: 22.1 pg — AB (ref 26.0–34.0)
MCHC: 30.2 g/dL (ref 30.0–36.0)
MCV: 73.1 fL — ABNORMAL LOW (ref 78.0–100.0)
MONOS PCT: 8 %
Monocytes Absolute: 0.8 10*3/uL (ref 0.1–1.0)
Neutro Abs: 8.3 10*3/uL — ABNORMAL HIGH (ref 1.7–7.7)
Neutrophils Relative %: 82 %
PLATELETS: 215 10*3/uL (ref 150–400)
RBC: 4.39 MIL/uL (ref 3.87–5.11)
RDW: 16.5 % — AB (ref 11.5–15.5)
WBC: 10.1 10*3/uL (ref 4.0–10.5)

## 2015-10-08 LAB — COMPREHENSIVE METABOLIC PANEL
ALT: 77 U/L — ABNORMAL HIGH (ref 14–54)
ANION GAP: 10 (ref 5–15)
AST: 102 U/L — ABNORMAL HIGH (ref 15–41)
Albumin: 3.6 g/dL (ref 3.5–5.0)
Alkaline Phosphatase: 140 U/L — ABNORMAL HIGH (ref 38–126)
BUN: 10 mg/dL (ref 6–20)
CHLORIDE: 98 mmol/L — AB (ref 101–111)
CO2: 25 mmol/L (ref 22–32)
CREATININE: 0.77 mg/dL (ref 0.44–1.00)
Calcium: 8.7 mg/dL — ABNORMAL LOW (ref 8.9–10.3)
Glucose, Bld: 267 mg/dL — ABNORMAL HIGH (ref 65–99)
POTASSIUM: 3.9 mmol/L (ref 3.5–5.1)
SODIUM: 133 mmol/L — AB (ref 135–145)
Total Bilirubin: 0.7 mg/dL (ref 0.3–1.2)
Total Protein: 8 g/dL (ref 6.5–8.1)

## 2015-10-08 LAB — I-STAT BETA HCG BLOOD, ED (MC, WL, AP ONLY)

## 2015-10-08 LAB — LIPASE, BLOOD: LIPASE: 17 U/L (ref 11–51)

## 2015-10-08 LAB — CBG MONITORING, ED: GLUCOSE-CAPILLARY: 239 mg/dL — AB (ref 65–99)

## 2015-10-08 MED ORDER — CLINDAMYCIN HCL 150 MG PO CAPS: 300.0000 mg | ORAL_CAPSULE | Freq: Three times a day (TID) | ORAL | Status: DC

## 2015-10-08 MED ORDER — ACETAMINOPHEN 500 MG PO TABS
1000.0000 mg | ORAL_TABLET | Freq: Once | ORAL | Status: AC
  Administered 2015-10-08: 1000 mg via ORAL
  Filled 2015-10-08: qty 2

## 2015-10-08 MED ORDER — OXYCODONE-ACETAMINOPHEN 5-325 MG PO TABS: 1.0000 | ORAL_TABLET | ORAL | Status: DC | PRN

## 2015-10-08 NOTE — Discharge Instructions (Signed)

## 2015-10-08 NOTE — ED Provider Notes (Signed)
CSN: [id number]     Arrival date & time 10/08/15  [id number] History   First MD Initiated Contact with Patient 10/08/15 [phone]     Chief Complaint  Patient presents with  . Facial Swelling     (Consider location/radiation/quality/duration/timing/severity/associated sxs/prior Treatment) HPI Chelsey Santiago is a(n) 40 y.o. female who presents to the emergency department with chief complaint of left facial swelling. She is a past medical history of diabetes, hypertension, obesity, heart failure. Patient did not take any of her medications today. She was here yesterday for tooth pain related to a dental filling which fell out of her second left lower molar. She was referred to her dentist who took x-rays and has given her referral to or surgeon for extraction of the tooth. Patient states that overnight her left jaw began swelling and becoming excruciatingly painful. She describes the pain as stabbing. She has pain in the left jaw that radiates to her ear. She denies any significant difficulty swallowing. The patient states that she also vomited several times overnight. She is not sure if it was secondary to taking Norco. However, she also took Zofran and states she normally does not get sick to her stomach. Patient also had associated chills and sweats. She arrives febrile in the ED with mild tachycardia today. She denies abdominal pain, diarrhea or other symptoms of gastrointestinal complaints.  Past Medical History  Diagnosis Date  . Hypertension     Lab: Normal BMet except glucose of 118 in 09/2010  . Hypertensive heart disease 2009    Pulmonary edema postpartum; mild to moderate mitral regurgitation when hospitalized for CHF in 2009; Echocardiogram in 12/2009-no MR and normal EF; normal CXR in 09/2010  . Migraine headache   . Anemia     H&H of 10.6/33 and 07/2008 and 11.9/35 and 09/2010  . Osteoarthritis, knee 03/29/2011  . Sleep apnea   . Depression with anxiety   . Fasting hyperglycemia   . Obesity  04/16/2009  . CHF (congestive heart failure) ([place])   . Pregnant   . Diabetes mellitus without complication ([place])   . Anxiety   . Enlarged heart   . Pulmonary edema   . Preeclampsia   . Threatened abortion in early pregnancy 03/15/2013  . Miscarriage 03/19/2013   Past Surgical History  Procedure Laterality Date  . Breast reduction surgery  2002  . Cesarean section N/A 04/09/2014    Procedure: CESAREAN SECTION;  Surgeon: Mora Bellman, MD;  Location: [facility];  Service: Obstetrics;  Laterality: N/A;  . Cholecystectomy     Family History  Problem Relation Age of Onset  . Diabetes Mother   . Heart disease Mother   . Hypertension Maternal Uncle   . Hyperlipidemia Paternal Grandfather   . Hypertension Paternal Grandfather   . Sudden death Neg Hx   . Heart disease Father   . Heart disease Maternal Grandmother   . Heart attack Brother    Social History  Substance Use Topics  . Smoking status: Never Smoker   . Smokeless tobacco: Never Used  . Alcohol Use: No     Comment: occ   OB History    Gravida Para Term Preterm AB TAB SAB Ectopic Multiple Living   11 6 5 1 5 2 3   0 6     Review of Systems  Ten systems reviewed and are negative for acute change, except as noted in the HPI.    Allergies  Diclofenac; Tramadol; and Vicodin  Home Medications   Prior  to Admission medications   Medication Sig Start Date End Date Taking? Authorizing Provider  amLODipine (NORVASC) 5 MG tablet Take 2 tablets (10 mg total) by mouth daily. 02/24/15  Yes Lendon Colonel, NP  aspirin EC 81 MG tablet Take 81 mg by mouth daily.   Yes Historical Provider, MD  carvedilol (COREG) 12.5 MG tablet TAKE 3 TABLETS BY MOUTH TWICE DAILY WITH A MEAL. 08/21/15  Yes Arnoldo Lenis, MD  citalopram (CELEXA) 20 MG tablet Take 20 mg by mouth daily.   Yes Historical Provider, MD  isosorbide-hydrALAZINE (BIDIL) 20-37.5 MG tablet Take 1 tablet by mouth 3 (three) times daily.   Yes Historical Provider, MD   metFORMIN (GLUCOPHAGE) 500 MG tablet Take 500 mg by mouth 2 (two) times daily with a meal.   Yes Historical Provider, MD  nitroGLYCERIN (NITROSTAT) 0.4 MG SL tablet Place 1 tablet (0.4 mg total) under the tongue every 5 (five) minutes as needed for chest pain. 09/05/15  Yes Kathie Dike, MD  ondansetron (ZOFRAN-ODT) 8 MG disintegrating tablet Take 1 tablet (8 mg total) by mouth every 8 (eight) hours as needed for nausea or vomiting. 10/07/15  Yes Fraser Din Focht, PA  potassium chloride SA (K-DUR,KLOR-CON) 20 MEQ tablet Take 2 tablets (40 mEq total) by mouth daily. 03/27/15  Yes Lendon Colonel, NP  pregabalin (LYRICA) 75 MG capsule Take 75 mg by mouth daily.   Yes Historical Provider, MD  ranitidine (ZANTAC) 150 MG tablet Take 150 mg by mouth daily as needed for heartburn.   Yes Historical Provider, MD  sacubitril-valsartan (ENTRESTO) 49-51 MG Take 1 tablet by mouth 2 (two) times daily. 09/05/15  Yes Kathie Dike, MD  sitaGLIPtin (JANUVIA) 100 MG tablet Take 100 mg by mouth daily.   Yes Historical Provider, MD  spironolactone (ALDACTONE) 25 MG tablet Take 1 tablet (25 mg total) by mouth daily. 02/24/15  Yes Lendon Colonel, NP  torsemide (DEMADEX) 20 MG tablet Take 1 tablet (20 mg total) by mouth daily. 02/24/15  Yes Lendon Colonel, NP  clindamycin (CLEOCIN) 150 MG capsule Take 2 capsules (300 mg total) by mouth 3 (three) times daily. 10/08/15   Margarita Mail, PA-C  oxyCODONE-acetaminophen (PERCOCET) 5-325 MG tablet Take 1-2 tablets by mouth every 4 (four) hours as needed. 10/08/15   Nihal Doan, PA-C   BP 171/99 mmHg  Pulse 115  Temp(Src) 101 F (38.3 C) (Oral)  Resp 18  Ht 5\' 6"  (1.676 m)  Wt 106.595 kg  BMI 37.95 kg/m2  SpO2 98%  LMP 09/21/2015 Physical Exam  Constitutional: She is oriented to person, place, and time. She appears well-developed and well-nourished. No distress.  HENT:  Head: Normocephalic and atraumatic.  Mouth/Throat: Oropharynx is clear and moist.  Left  lower 2nd molar with missing filling. There is some bleeding arounf the tooth at the gumline and some Bleeding in the gum. Swelling is palpable in gums inferior to the tooth with visible swelling along the mandible.   Eyes: Conjunctivae are normal. No scleral icterus.  Neck: Normal range of motion.  Cardiovascular: Normal rate, regular rhythm and normal heart sounds.  Exam reveals no gallop and no friction rub.   No murmur heard. Pulmonary/Chest: Effort normal and breath sounds normal. No respiratory distress.  Abdominal: Soft. Bowel sounds are normal. She exhibits no distension and no mass. There is no tenderness. There is no guarding.  Neurological: She is alert and oriented to person, place, and time.  Skin: Skin is warm and dry. She is  not diaphoretic.  Nursing note and vitals reviewed.   ED Course  Procedures (including critical care time) Labs Review Labs Reviewed  CBC WITH DIFFERENTIAL/PLATELET - Abnormal; Notable for the following:    Hemoglobin 9.7 (*)    HCT 32.1 (*)    MCV 73.1 (*)    MCH 22.1 (*)    RDW 16.5 (*)    Neutro Abs 8.3 (*)    All other components within normal limits  COMPREHENSIVE METABOLIC PANEL - Abnormal; Notable for the following:    Sodium 133 (*)    Chloride 98 (*)    Glucose, Bld 267 (*)    Calcium 8.7 (*)    AST 102 (*)    ALT 77 (*)    Alkaline Phosphatase 140 (*)    All other components within normal limits  CBG MONITORING, ED - Abnormal; Notable for the following:    Glucose-Capillary 239 (*)    All other components within normal limits  LIPASE, BLOOD  I-STAT BETA HCG BLOOD, ED ([facility], [facility], [facility] ONLY)  CBG MONITORING, ED    Imaging Review No results found. I have personally reviewed and evaluated these images and lab results as part of my medical decision-making.   EKG Interpretation None      MDM   Final diagnoses:  Dental infection    Patient with developing dental abscess, She has had 4 doses of penicillin and . She ahs a  fever and tachycardia, but there is no sign pf lusdwigs angina. At this time I do not feel she warrants inpatient admission or scan for deep infection, however I have given the patient strong return precaution to include pain with swallowing, worsening condition, uncontrolled fevers. She is to take her normal home medications and switch to clindamycin for broader coverage. Margarita Mail, PA-C 10/08/15 1044  Ripley Fraise, MD 10/08/15 [phone]

## 2015-10-08 NOTE — ED Notes (Signed)
Left lower jaw pain and swelling. Pt seen in ED yesterday and given Vicodin-reports n/v. Pt also given prescription for penicillin.

## 2015-10-08 NOTE — ED Notes (Signed)
Patient given discharge instruction, verbalized understand. Patient ambulatory out of the department.  

## 2015-10-10 ENCOUNTER — Encounter (HOSPITAL_COMMUNITY): Payer: Self-pay | Admitting: Emergency Medicine

## 2015-10-10 ENCOUNTER — Inpatient Hospital Stay (HOSPITAL_COMMUNITY)
Admission: EM | Admit: 2015-10-10 | Discharge: 2015-10-12 | DRG: 158 | Disposition: A | Discharge status: Home / Self Care | Payer: Medicaid Other | Attending: Internal Medicine | Admitting: Internal Medicine

## 2015-10-10 ENCOUNTER — Encounter (HOSPITAL_COMMUNITY): Payer: Medicaid Other

## 2015-10-10 ENCOUNTER — Emergency Department (HOSPITAL_COMMUNITY): Payer: Medicaid Other

## 2015-10-10 DIAGNOSIS — E669 Obesity, unspecified: Secondary | ICD-10-CM | POA: Diagnosis present

## 2015-10-10 DIAGNOSIS — E119 Type 2 diabetes mellitus without complications: Secondary | ICD-10-CM

## 2015-10-10 DIAGNOSIS — Z8249 Family history of ischemic heart disease and other diseases of the circulatory system: Secondary | ICD-10-CM

## 2015-10-10 DIAGNOSIS — L03211 Cellulitis of face: Secondary | ICD-10-CM | POA: Diagnosis present

## 2015-10-10 DIAGNOSIS — I10 Essential (primary) hypertension: Secondary | ICD-10-CM | POA: Diagnosis present

## 2015-10-10 DIAGNOSIS — E1165 Type 2 diabetes mellitus with hyperglycemia: Secondary | ICD-10-CM | POA: Diagnosis present

## 2015-10-10 DIAGNOSIS — Z833 Family history of diabetes mellitus: Secondary | ICD-10-CM

## 2015-10-10 DIAGNOSIS — I110 Hypertensive heart disease with heart failure: Secondary | ICD-10-CM | POA: Diagnosis present

## 2015-10-10 DIAGNOSIS — K047 Periapical abscess without sinus: Secondary | ICD-10-CM | POA: Diagnosis not present

## 2015-10-10 DIAGNOSIS — Z6837 Body mass index (BMI) 37.0-37.9, adult: Secondary | ICD-10-CM

## 2015-10-10 DIAGNOSIS — I5032 Chronic diastolic (congestive) heart failure: Secondary | ICD-10-CM | POA: Diagnosis present

## 2015-10-10 DIAGNOSIS — Z7984 Long term (current) use of oral hypoglycemic drugs: Secondary | ICD-10-CM

## 2015-10-10 LAB — GLUCOSE, CAPILLARY: Glucose-Capillary: 221 mg/dL — ABNORMAL HIGH (ref 65–99)

## 2015-10-10 LAB — CBC WITH DIFFERENTIAL/PLATELET
BASOS PCT: 0 %
Basophils Absolute: 0 10*3/uL (ref 0.0–0.1)
EOS ABS: 0.2 10*3/uL (ref 0.0–0.7)
EOS PCT: 2 %
HEMATOCRIT: 32 % — AB (ref 36.0–46.0)
Hemoglobin: 9.7 g/dL — ABNORMAL LOW (ref 12.0–15.0)
LYMPHS ABS: 2.1 10*3/uL (ref 0.7–4.0)
LYMPHS PCT: 24 %
MCH: 22 pg — ABNORMAL LOW (ref 26.0–34.0)
MCHC: 30.3 g/dL (ref 30.0–36.0)
MCV: 72.6 fL — AB (ref 78.0–100.0)
MONO ABS: 0.5 10*3/uL (ref 0.1–1.0)
Monocytes Relative: 6 %
Neutro Abs: 6.1 10*3/uL (ref 1.7–7.7)
Neutrophils Relative %: 68 %
PLATELETS: 232 10*3/uL (ref 150–400)
RBC: 4.41 MIL/uL (ref 3.87–5.11)
RDW: 16.4 % — AB (ref 11.5–15.5)
WBC: 8.9 10*3/uL (ref 4.0–10.5)

## 2015-10-10 LAB — BASIC METABOLIC PANEL
ANION GAP: 7 (ref 5–15)
BUN: 8 mg/dL (ref 6–20)
CO2: 28 mmol/L (ref 22–32)
Calcium: 8.9 mg/dL (ref 8.9–10.3)
Chloride: 101 mmol/L (ref 101–111)
Creatinine, Ser: 0.63 mg/dL (ref 0.44–1.00)
Glucose, Bld: 190 mg/dL — ABNORMAL HIGH (ref 65–99)
POTASSIUM: 3.9 mmol/L (ref 3.5–5.1)
SODIUM: 136 mmol/L (ref 135–145)

## 2015-10-10 MED ORDER — CLINDAMYCIN PHOSPHATE 600 MG/50ML IV SOLN
600.0000 mg | Freq: Once | INTRAVENOUS | Status: AC
  Administered 2015-10-10: 600 mg via INTRAVENOUS
  Filled 2015-10-10: qty 50

## 2015-10-10 MED ORDER — OXYCODONE-ACETAMINOPHEN 5-325 MG PO TABS
1.0000 | ORAL_TABLET | Freq: Once | ORAL | Status: AC
  Administered 2015-10-10: 1 via ORAL
  Filled 2015-10-10: qty 1

## 2015-10-10 MED ORDER — CITALOPRAM HYDROBROMIDE 20 MG PO TABS
20.0000 mg | ORAL_TABLET | Freq: Every day | ORAL | Status: DC
  Administered 2015-10-10 – 2015-10-12 (×3): 20 mg via ORAL
  Filled 2015-10-10 (×3): qty 1

## 2015-10-10 MED ORDER — ASPIRIN EC 81 MG PO TBEC
81.0000 mg | DELAYED_RELEASE_TABLET | Freq: Every day | ORAL | Status: DC
  Administered 2015-10-11 – 2015-10-12 (×2): 81 mg via ORAL
  Filled 2015-10-10 (×2): qty 1

## 2015-10-10 MED ORDER — FAMOTIDINE 20 MG PO TABS
20.0000 mg | ORAL_TABLET | Freq: Every day | ORAL | Status: DC
  Administered 2015-10-10 – 2015-10-12 (×3): 20 mg via ORAL
  Filled 2015-10-10 (×3): qty 1

## 2015-10-10 MED ORDER — OXYCODONE-ACETAMINOPHEN 5-325 MG PO TABS
1.0000 | ORAL_TABLET | Freq: Four times a day (QID) | ORAL | Status: DC | PRN
  Administered 2015-10-10 – 2015-10-12 (×4): 1 via ORAL
  Filled 2015-10-10 (×4): qty 1

## 2015-10-10 MED ORDER — NITROGLYCERIN 0.4 MG SL SUBL: 0.4000 mg | SUBLINGUAL_TABLET | SUBLINGUAL | Status: DC | PRN

## 2015-10-10 MED ORDER — AMLODIPINE BESYLATE 5 MG PO TABS
10.0000 mg | ORAL_TABLET | Freq: Every day | ORAL | Status: DC
  Administered 2015-10-11 – 2015-10-12 (×2): 10 mg via ORAL
  Filled 2015-10-10 (×2): qty 2

## 2015-10-10 MED ORDER — POTASSIUM CHLORIDE CRYS ER 20 MEQ PO TBCR
40.0000 meq | EXTENDED_RELEASE_TABLET | Freq: Every day | ORAL | Status: DC
  Administered 2015-10-11 – 2015-10-12 (×2): 40 meq via ORAL
  Filled 2015-10-10 (×2): qty 2

## 2015-10-10 MED ORDER — ISOSORB DINITRATE-HYDRALAZINE 20-37.5 MG PO TABS
1.0000 | ORAL_TABLET | Freq: Three times a day (TID) | ORAL | Status: DC
  Administered 2015-10-10 – 2015-10-12 (×5): 1 via ORAL
  Filled 2015-10-10 (×8): qty 1

## 2015-10-10 MED ORDER — LINAGLIPTIN 5 MG PO TABS
5.0000 mg | ORAL_TABLET | Freq: Every day | ORAL | Status: DC
  Administered 2015-10-11 – 2015-10-12 (×2): 5 mg via ORAL
  Filled 2015-10-10 (×2): qty 1

## 2015-10-10 MED ORDER — INSULIN ASPART 100 UNIT/ML ~~LOC~~ SOLN
0.0000 [IU] | Freq: Every day | SUBCUTANEOUS | Status: DC
  Administered 2015-10-10: 2 [IU] via SUBCUTANEOUS

## 2015-10-10 MED ORDER — SODIUM CHLORIDE 0.9 % IV SOLN
INTRAVENOUS | Status: AC
  Filled 2015-10-10: qty 3

## 2015-10-10 MED ORDER — ONDANSETRON 4 MG PO TBDP: 8.0000 mg | ORAL_TABLET | Freq: Three times a day (TID) | ORAL | Status: DC | PRN

## 2015-10-10 MED ORDER — INSULIN ASPART 100 UNIT/ML ~~LOC~~ SOLN
4.0000 [IU] | Freq: Three times a day (TID) | SUBCUTANEOUS | Status: DC
  Administered 2015-10-11 – 2015-10-12 (×5): 4 [IU] via SUBCUTANEOUS

## 2015-10-10 MED ORDER — ONDANSETRON HCL 4 MG PO TABS
4.0000 mg | ORAL_TABLET | Freq: Four times a day (QID) | ORAL | Status: DC | PRN
  Administered 2015-10-10: 4 mg via ORAL
  Filled 2015-10-10: qty 1

## 2015-10-10 MED ORDER — SODIUM CHLORIDE 0.9 % IV SOLN
3.0000 g | Freq: Four times a day (QID) | INTRAVENOUS | Status: DC
  Administered 2015-10-10 – 2015-10-12 (×7): 3 g via INTRAVENOUS
  Filled 2015-10-10 (×12): qty 3

## 2015-10-10 MED ORDER — TORSEMIDE 20 MG PO TABS
20.0000 mg | ORAL_TABLET | Freq: Every day | ORAL | Status: DC
  Administered 2015-10-11 – 2015-10-12 (×2): 20 mg via ORAL
  Filled 2015-10-10 (×2): qty 1

## 2015-10-10 MED ORDER — SODIUM CHLORIDE 0.9 % IV SOLN: 3.0000 g | Freq: Once | INTRAVENOUS | Status: DC

## 2015-10-10 MED ORDER — ENOXAPARIN SODIUM 40 MG/0.4ML ~~LOC~~ SOLN
40.0000 mg | SUBCUTANEOUS | Status: DC
  Administered 2015-10-10 – 2015-10-11 (×2): 40 mg via SUBCUTANEOUS
  Filled 2015-10-10 (×2): qty 0.4

## 2015-10-10 MED ORDER — ONDANSETRON HCL 4 MG/2ML IJ SOLN
4.0000 mg | Freq: Four times a day (QID) | INTRAMUSCULAR | Status: DC | PRN
  Administered 2015-10-12: 4 mg via INTRAVENOUS
  Filled 2015-10-10: qty 2

## 2015-10-10 MED ORDER — ISOSORB DINITRATE-HYDRALAZINE 20-37.5 MG PO TABS
ORAL_TABLET | ORAL | Status: AC
  Filled 2015-10-10: qty 1

## 2015-10-10 MED ORDER — CARVEDILOL 12.5 MG PO TABS
12.5000 mg | ORAL_TABLET | Freq: Two times a day (BID) | ORAL | Status: DC
  Administered 2015-10-10 – 2015-10-12 (×4): 12.5 mg via ORAL
  Filled 2015-10-10 (×4): qty 1

## 2015-10-10 MED ORDER — ACETAMINOPHEN 650 MG RE SUPP: 650.0000 mg | Freq: Four times a day (QID) | RECTAL | Status: DC | PRN

## 2015-10-10 MED ORDER — SODIUM CHLORIDE 0.9 % IV SOLN
3.0000 g | Freq: Once | INTRAVENOUS | Status: AC
  Administered 2015-10-10: 3 g via INTRAVENOUS
  Filled 2015-10-10: qty 3

## 2015-10-10 MED ORDER — ZOLPIDEM TARTRATE 5 MG PO TABS
5.0000 mg | ORAL_TABLET | Freq: Every evening | ORAL | Status: DC | PRN
  Administered 2015-10-10 – 2015-10-11 (×2): 5 mg via ORAL
  Filled 2015-10-10 (×2): qty 1

## 2015-10-10 MED ORDER — HYDRALAZINE HCL 25 MG PO TABS
25.0000 mg | ORAL_TABLET | Freq: Three times a day (TID) | ORAL | Status: DC
  Administered 2015-10-10 – 2015-10-11 (×3): 25 mg via ORAL
  Filled 2015-10-10 (×3): qty 1

## 2015-10-10 MED ORDER — INSULIN ASPART 100 UNIT/ML ~~LOC~~ SOLN
0.0000 [IU] | Freq: Three times a day (TID) | SUBCUTANEOUS | Status: DC
  Administered 2015-10-11: 5 [IU] via SUBCUTANEOUS
  Administered 2015-10-11 – 2015-10-12 (×4): 3 [IU] via SUBCUTANEOUS

## 2015-10-10 MED ORDER — PREGABALIN 75 MG PO CAPS
75.0000 mg | ORAL_CAPSULE | Freq: Every day | ORAL | Status: DC
  Administered 2015-10-11 – 2015-10-12 (×2): 75 mg via ORAL
  Filled 2015-10-10 (×2): qty 1

## 2015-10-10 MED ORDER — IOPAMIDOL (ISOVUE-300) INJECTION 61%
75.0000 mL | Freq: Once | INTRAVENOUS | Status: AC | PRN
Start: 2015-10-10 — End: 2015-10-10
  Administered 2015-10-10: 75 mL via INTRAVENOUS

## 2015-10-10 MED ORDER — SPIRONOLACTONE 25 MG PO TABS
25.0000 mg | ORAL_TABLET | Freq: Every day | ORAL | Status: DC
  Administered 2015-10-11 – 2015-10-12 (×2): 25 mg via ORAL
  Filled 2015-10-10 (×2): qty 1

## 2015-10-10 MED ORDER — ACETAMINOPHEN 325 MG PO TABS
650.0000 mg | ORAL_TABLET | Freq: Four times a day (QID) | ORAL | Status: DC | PRN
  Administered 2015-10-11: 650 mg via ORAL
  Filled 2015-10-10: qty 2

## 2015-10-10 NOTE — ED Notes (Signed)
Urine Pregnancy Test Results: NEGATIVE Control (+)  HCG Cassette Rapid Test Catalog Number : [id number] Lot : [id number] Exp : 2018/06

## 2015-10-10 NOTE — H&P (Addendum)
History and Physical  Chelsey Santiago [id number] DOB: 07-17-1975 DOA: 10/10/2015  Referring physician: Evalee Jefferson, Hershal Coria, ED provider PCP: Elyn Peers, MD  Outpatient Specialists:   Dr Harl Bowie (Cardiology)  Dr Elonda Husky (OB/Gyn)  Chief Complaint: Left facial swelling  HPI: Chelsey Santiago is a 40 y.o. female with a history of uncontrolled type 2 diabetes, hypertension, obesity, grade 1 diastolic heart failure with EF of 50-55%.  She was diagnosed with dental abscess in the left mandibular molars and was prescribed first penicillin which she took on 10/07/15. She was seen the next day due to increasing swelling and pain and trismus. She was switched to clindamycin, which she took for 48 hours. With her face continued to swell, the patient was directed to the emergency department by her dentist for treatment. She has difficulty opening her mouth. Chewing increases her pain. The hypertrophic and pain medicine that she was given makes her pain improved. She denies fevers or chills, except for when she was in the emergency department 2 days ago. Her blood sugars have been elevated, although she reports having difficult control blood sugars.   Review of Systems:   Pt denies any fevers, chills, nausea, vomiting, diarrhea, constipation, abdominal pain, shortness of breath, dyspnea on exertion, orthopnea, cough, wheezing, palpitations, headache, vision changes, lightheadedness, dizziness, melena, rectal bleeding.  Review of systems are otherwise negative  Past Medical History  Diagnosis Date  . Hypertension     Lab: Normal BMet except glucose of 118 in 09/2010  . Hypertensive heart disease 2009    Pulmonary edema postpartum; mild to moderate mitral regurgitation when hospitalized for CHF in 2009; Echocardiogram in 12/2009-no MR and normal EF; normal CXR in 09/2010  . Migraine headache   . Anemia     H&H of 10.6/33 and 07/2008 and 11.9/35 and 09/2010  . Osteoarthritis, knee 03/29/2011  . Sleep apnea     . Depression with anxiety   . Fasting hyperglycemia   . Obesity 04/16/2009  . CHF (congestive heart failure) ([place])   . Pregnant   . Diabetes mellitus without complication ([place])   . Anxiety   . Enlarged heart   . Pulmonary edema   . Preeclampsia   . Threatened abortion in early pregnancy 03/15/2013  . Miscarriage 03/19/2013   Past Surgical History  Procedure Laterality Date  . Breast reduction surgery  2002  . Cesarean section N/A 04/09/2014    Procedure: CESAREAN SECTION;  Surgeon: Mora Bellman, MD;  Location: [facility];  Service: Obstetrics;  Laterality: N/A;  . Cholecystectomy     Social History:  reports that she has never smoked. She has never used smokeless tobacco. She reports that she does not drink alcohol or use illicit drugs. Patient lives at [facility]  . Diclofenac     Edema  . Tramadol Nausea And Vomiting  . Vicodin [Hydrocodone-Acetaminophen] Nausea Only    Family History  Problem Relation Age of Onset  . Diabetes Mother   . Heart disease Mother   . Hypertension Maternal Uncle   . Hyperlipidemia Paternal Grandfather   . Hypertension Paternal Grandfather   . Sudden death Neg Hx   . Heart disease Father   . Heart disease Maternal Grandmother   . Heart attack Brother      Prior to Admission medications   Medication Sig Start Date End Date Taking? Authorizing Provider  amLODipine (NORVASC) 5 MG tablet Take 2 tablets (10 mg total) by mouth daily. 02/24/15  Yes Phill Myron  Lawrence, NP  aspirin EC 81 MG tablet Take 81 mg by mouth daily.   Yes Historical Provider, MD  carvedilol (COREG) 12.5 MG tablet TAKE 3 TABLETS BY MOUTH TWICE DAILY WITH A MEAL. 08/21/15  Yes Arnoldo Lenis, MD  clindamycin (CLEOCIN) 150 MG capsule Take 2 capsules (300 mg total) by mouth 3 (three) times daily. 10/08/15  Yes Abigail Harris, PA-C  isosorbide-hydrALAZINE (BIDIL) 20-37.5 MG tablet Take 1 tablet by mouth 3 (three) times daily.   Yes Historical Provider, MD   metFORMIN (GLUCOPHAGE) 500 MG tablet Take 500 mg by mouth 2 (two) times daily with a meal.   Yes Historical Provider, MD  nitroGLYCERIN (NITROSTAT) 0.4 MG SL tablet Place 1 tablet (0.4 mg total) under the tongue every 5 (five) minutes as needed for chest pain. 09/05/15  Yes Kathie Dike, MD  ondansetron (ZOFRAN-ODT) 8 MG disintegrating tablet Take 1 tablet (8 mg total) by mouth every 8 (eight) hours as needed for nausea or vomiting. 10/07/15  Yes Kalman Drape, PA  oxyCODONE-acetaminophen (PERCOCET) 5-325 MG tablet Take 1-2 tablets by mouth every 4 (four) hours as needed. 10/08/15  Yes Margarita Mail, PA-C  potassium chloride SA (K-DUR,KLOR-CON) 20 MEQ tablet Take 2 tablets (40 mEq total) by mouth daily. 03/27/15  Yes Lendon Colonel, NP  pregabalin (LYRICA) 75 MG capsule Take 75 mg by mouth daily.   Yes Historical Provider, MD  ranitidine (ZANTAC) 150 MG tablet Take 150 mg by mouth daily as needed for heartburn.   Yes Historical Provider, MD  sacubitril-valsartan (ENTRESTO) 49-51 MG Take 1 tablet by mouth 2 (two) times daily. 09/05/15  Yes Kathie Dike, MD  sitaGLIPtin (JANUVIA) 100 MG tablet Take 100 mg by mouth daily.   Yes Historical Provider, MD  spironolactone (ALDACTONE) 25 MG tablet Take 1 tablet (25 mg total) by mouth daily. 02/24/15  Yes Lendon Colonel, NP  torsemide (DEMADEX) 20 MG tablet Take 1 tablet (20 mg total) by mouth daily. 02/24/15  Yes Lendon Colonel, NP  citalopram (CELEXA) 20 MG tablet Take 20 mg by mouth daily.    Historical Provider, MD    Physical Exam: BP 163/103 mmHg  Pulse 95  Temp(Src) 98.7 F (37.1 C) (Oral)  Resp 16  Ht 5\' 6"  (1.676 m)  Wt 106.595 kg (235 lb)  BMI 37.95 kg/m2  SpO2 100%  LMP 09/21/2015  General: Middle-aged Caucasian female. Awake and alert and oriented x3. No acute cardiopulmonary distress.  HEENT: Normocephalic atraumatic.  Right and left ears normal in appearance.  Pupils equal, round, reactive to light. Extraocular muscles  are intact. Sclerae anicteric and noninjected.  Moist mucosal membranes. No mucosal lesions. There is significant swelling of the left jaw with erythema on the skin that extends down into beginning part of her neck and up to her cheek. She is able to open her mouth approximately 3-4 cm. No palpable abscess. Neck: Neck supple without lymphadenopathy. No carotid bruits. No masses palpated.  Cardiovascular: Regular rate with normal S1-S2 sounds. No murmurs, rubs, gallops auscultated. No JVD.  Respiratory: Good respiratory effort with no wheezes, rales, rhonchi. Lungs clear to auscultation bilaterally.  No accessory muscle use. Abdomen: Soft, nontender, nondistended. Active bowel sounds. No masses or hepatosplenomegaly  Skin: No rashes, lesions, or ulcerations.  Dry, warm to touch. 2+ dorsalis pedis and radial pulses. Musculoskeletal: No calf or leg pain. All major joints not erythematous nontender.  No upper or lower joint deformation.  Good ROM.  No contractures  Psychiatric: Intact judgment  and insight. Pleasant and cooperative. Neurologic: No focal neurological deficits. Strength is 5/5 and symmetric in upper and lower extremities.  Cranial nerves II through XII are grossly intact.           Labs on Admission: I have personally reviewed following labs and imaging studies  CBC:  Recent Labs Lab 10/08/15 0905 10/10/15 1229  WBC 10.1 8.9  NEUTROABS 8.3* 6.1  HGB 9.7* 9.7*  HCT 32.1* 32.0*  MCV 73.1* 72.6*  PLT 215 [id number]   Basic Metabolic Panel:  Recent Labs Lab 10/08/15 0905 10/10/15 1229  NA 133* 136  K 3.9 3.9  CL 98* 101  CO2 25 28  GLUCOSE 267* 190*  BUN 10 8  CREATININE 0.77 0.63  CALCIUM 8.7* 8.9   GFR: Estimated Creatinine Clearance: 115.4 mL/min (by C-G formula based on Cr of 0.63). Liver Function Tests:  Recent Labs Lab 10/08/15 0905  AST 102*  ALT 77*  ALKPHOS 140*  BILITOT 0.7  PROT 8.0  ALBUMIN 3.6    Recent Labs Lab 10/08/15 0905  LIPASE 17   No  results for input(s): AMMONIA in the last 168 hours. Coagulation Profile: No results for input(s): INR, PROTIME in the last 168 hours. Cardiac Enzymes: No results for input(s): CKTOTAL, CKMB, CKMBINDEX, TROPONINI in the last 168 hours. BNP (last 3 results) No results for input(s): PROBNP in the last 8760 hours. HbA1C: No results for input(s): HGBA1C in the last 72 hours. CBG:  Recent Labs Lab 10/08/15 0920  GLUCAP 239*   Lipid Profile: No results for input(s): CHOL, HDL, LDLCALC, TRIG, CHOLHDL, LDLDIRECT in the last 72 hours. Thyroid Function Tests: No results for input(s): TSH, T4TOTAL, FREET4, T3FREE, THYROIDAB in the last 72 hours. Anemia Panel: No results for input(s): VITAMINB12, FOLATE, FERRITIN, TIBC, IRON, RETICCTPCT in the last 72 hours. Urine analysis:    Component Value Date/Time   COLORURINE YELLOW 09/04/2015 [place] 09/04/2015 2225   LABSPEC 1.025 09/04/2015 2225   PHURINE 5.5 09/04/2015 2225   GLUCOSEU NEGATIVE 09/04/2015 2225   HGBUR NEGATIVE 09/04/2015 2225   BILIRUBINUR NEGATIVE 09/04/2015 2225   KETONESUR NEGATIVE 09/04/2015 2225   PROTEINUR NEGATIVE 09/04/2015 2225   PROTEINUR 2 04/08/2014 0945   UROBILINOGEN 0.2 04/29/2014 1115   NITRITE NEGATIVE 09/04/2015 2225   NITRITE neg 04/08/2014 0945   LEUKOCYTESUR NEGATIVE 09/04/2015 2225   Sepsis Labs: @LABRCNTIP (procalcitonin:4,lacticidven:4) )No results found for this or any previous visit (from the past 240 hour(s)).   Radiological Exams on Admission: Ct Maxillofacial W/cm  10/10/2015  CLINICAL DATA:  Left facial swelling.  Pain. EXAM: CT MAXILLOFACIAL WITH CONTRAST TECHNIQUE: Multidetector CT imaging of the maxillofacial structures was performed with intravenous contrast. Multiplanar CT image reconstructions were also generated. A small metallic BB was placed on the right temple in order to reliably differentiate right from left. CONTRAST:  31mL ISOVUE-300 IOPAMIDOL (ISOVUE-300)  INJECTION 61% COMPARISON:  None. FINDINGS: There is edema in the subcutaneous fat with skin thickening along the left side of the face. There is soft tissue edema adjacent to the left side of the mandible consistent with edema and inflammation. There is no drainable fluid collection to suggest an abscess. There is periapical lucency involving the third left mandibular molar concerning for infection. The globes are intact. The orbital walls are intact. The orbital floors are intact. The maxilla is intact. The mandible is intact. The zygomatic arches are intact. The nasal septum is midline. There is no nasal bone fracture. The temporomandibular joints are  normal. The paranasal sinuses are clear. The visualized portions of the mastoid sinuses are well aerated. IMPRESSION: 1. Findings concerning for left facial cellulitis. Mild edema and expansion of the left masticator muscle likely reflecting mild myositis. No drainable fluid collection at this time to suggest an abscess. Periapical lucency involving the left third mandibular molar concerning for infection. Electronically Signed   By: Kathreen Devoid   On: 10/10/2015 15:13   Assessment/Plan: Principal Problem:   Dental infection Active Problems:   Hypertension   DM type 2 (diabetes mellitus, type 2) ([place])    This patient was discussed with the ED physician, including pertinent vitals, physical exam findings, labs, and imaging.  We also discussed care given by the ED provider.  #1 dental infection  Admit - failed outpatient treatment  Will increase antibiotics to Unasyn 3 g every 6 hours  Repeat CBC in the morning  Percocet and ibuprofen for pain #2 hypertension  Hold ARB for 48 hours  We'll give hydralazine 25 mg every 8 hours in place of ARB #3 diabetes type 2  Hold metformin for 48 hours  CBGs every before meals and daily at bedtime  Prenatal insulin 4 units with sliding scale  If continues to be elevated, may need long-acting insulin  tomorrow  Cardiac diet  DVT prophylaxis: Lovenox Consultants: None Code Status: Full code Family Communication: None  Disposition Plan: Admit   Truett Mainland, DO [facility] Pager [phone]  If 7PM-7AM, please contact night-coverage [email or web address] Password [email or web address]

## 2015-10-10 NOTE — ED Notes (Signed)
Pt reports increase in swelling to a dental abscess on LT side of mouth. States her dentist sent her over for "emergency antibiotics." Pt currently taking clindamycin. Airway patent. NAD noted.

## 2015-10-10 NOTE — ED Provider Notes (Signed)
CSN: [id number]     Arrival date & time 10/10/15  1014 History   First MD Initiated Contact with Patient 10/10/15 1153     Chief Complaint  Patient presents with  . Oral Swelling     (Consider location/radiation/quality/duration/timing/severity/associated sxs/prior Treatment) The history is provided by the patient.   Chelsey Santiago is a 40 y.o. female presenting with worsening Left-sided cheek neck pain, swelling and now redness associated with a dental infection.  She was seen by her dentist 3 days ago and placed on penicillin, then return here the next day due to worsening pain and swelling.  At that time her antibiotic was switched to clindamycin which she has had extensive doses, taking 600 mg 3 times a day both Wednesday and yesterday but continues to have increasing swelling and pain.  She has had fever up to 101.0 2 days ago, but denies feeling febrile today, although has not checked her temperature.  She denies nausea or vomiting, and is able to swallow and eat however with discomfort.  She called her dentist who recommended reevaluation here.    Past Medical History  Diagnosis Date  . Hypertension     Lab: Normal BMet except glucose of 118 in 09/2010  . Hypertensive heart disease 2009    Pulmonary edema postpartum; mild to moderate mitral regurgitation when hospitalized for CHF in 2009; Echocardiogram in 12/2009-no MR and normal EF; normal CXR in 09/2010  . Migraine headache   . Anemia     H&H of 10.6/33 and 07/2008 and 11.9/35 and 09/2010  . Osteoarthritis, knee 03/29/2011  . Sleep apnea   . Depression with anxiety   . Fasting hyperglycemia   . Obesity 04/16/2009  . CHF (congestive heart failure) ([place])   . Pregnant   . Diabetes mellitus without complication ([place])   . Anxiety   . Enlarged heart   . Pulmonary edema   . Preeclampsia   . Threatened abortion in early pregnancy 03/15/2013  . Miscarriage 03/19/2013   Past Surgical History  Procedure Laterality Date  . Breast  reduction surgery  2002  . Cesarean section N/A 04/09/2014    Procedure: CESAREAN SECTION;  Surgeon: Mora Bellman, MD;  Location: [facility];  Service: Obstetrics;  Laterality: N/A;  . Cholecystectomy     Family History  Problem Relation Age of Onset  . Diabetes Mother   . Heart disease Mother   . Hypertension Maternal Uncle   . Hyperlipidemia Paternal Grandfather   . Hypertension Paternal Grandfather   . Sudden death Neg Hx   . Heart disease Father   . Heart disease Maternal Grandmother   . Heart attack Brother    Social History  Substance Use Topics  . Smoking status: Never Smoker   . Smokeless tobacco: Never Used  . Alcohol Use: No     Comment: occ   OB History    Gravida Para Term Preterm AB TAB SAB Ectopic Multiple Living   11 6 5 1 5 2 3   0 6     Review of Systems  Constitutional: Positive for fever.  HENT: Positive for dental problem and facial swelling. Negative for congestion, sore throat and voice change.   Eyes: Negative.   Respiratory: Negative for chest tightness and shortness of breath.   Cardiovascular: Negative for chest pain.  Gastrointestinal: Negative for nausea, vomiting and abdominal pain.  Genitourinary: Negative.   Musculoskeletal: Negative for joint swelling, arthralgias and neck pain.  Skin: Negative.  Negative for rash and wound.  Neurological: Negative for dizziness, weakness, light-headedness, numbness and headaches.  Psychiatric/Behavioral: Negative.       Allergies  Diclofenac; Tramadol; and Vicodin  Home Medications   Prior to Admission medications   Medication Sig Start Date End Date Taking? Authorizing Provider  amLODipine (NORVASC) 5 MG tablet Take 2 tablets (10 mg total) by mouth daily. 02/24/15  Yes Lendon Colonel, NP  aspirin EC 81 MG tablet Take 81 mg by mouth daily.   Yes Historical Provider, MD  carvedilol (COREG) 12.5 MG tablet TAKE 3 TABLETS BY MOUTH TWICE DAILY WITH A MEAL. 08/21/15  Yes Arnoldo Lenis, MD   clindamycin (CLEOCIN) 150 MG capsule Take 2 capsules (300 mg total) by mouth 3 (three) times daily. 10/08/15  Yes Abigail Harris, PA-C  isosorbide-hydrALAZINE (BIDIL) 20-37.5 MG tablet Take 1 tablet by mouth 3 (three) times daily.   Yes Historical Provider, MD  metFORMIN (GLUCOPHAGE) 500 MG tablet Take 500 mg by mouth 2 (two) times daily with a meal.   Yes Historical Provider, MD  nitroGLYCERIN (NITROSTAT) 0.4 MG SL tablet Place 1 tablet (0.4 mg total) under the tongue every 5 (five) minutes as needed for chest pain. 09/05/15  Yes Kathie Dike, MD  ondansetron (ZOFRAN-ODT) 8 MG disintegrating tablet Take 1 tablet (8 mg total) by mouth every 8 (eight) hours as needed for nausea or vomiting. 10/07/15  Yes Kalman Drape, PA  oxyCODONE-acetaminophen (PERCOCET) 5-325 MG tablet Take 1-2 tablets by mouth every 4 (four) hours as needed. 10/08/15  Yes Margarita Mail, PA-C  potassium chloride SA (K-DUR,KLOR-CON) 20 MEQ tablet Take 2 tablets (40 mEq total) by mouth daily. 03/27/15  Yes Lendon Colonel, NP  pregabalin (LYRICA) 75 MG capsule Take 75 mg by mouth daily.   Yes Historical Provider, MD  ranitidine (ZANTAC) 150 MG tablet Take 150 mg by mouth daily as needed for heartburn.   Yes Historical Provider, MD  sacubitril-valsartan (ENTRESTO) 49-51 MG Take 1 tablet by mouth 2 (two) times daily. 09/05/15  Yes Kathie Dike, MD  sitaGLIPtin (JANUVIA) 100 MG tablet Take 100 mg by mouth daily.   Yes Historical Provider, MD  spironolactone (ALDACTONE) 25 MG tablet Take 1 tablet (25 mg total) by mouth daily. 02/24/15  Yes Lendon Colonel, NP  torsemide (DEMADEX) 20 MG tablet Take 1 tablet (20 mg total) by mouth daily. 02/24/15  Yes Lendon Colonel, NP  citalopram (CELEXA) 20 MG tablet Take 20 mg by mouth daily.    Historical Provider, MD   BP 163/103 mmHg  Pulse 95  Temp(Src) 98.7 F (37.1 C) (Oral)  Resp 16  Ht 5\' 6"  (1.676 m)  Wt 106.595 kg  BMI 37.95 kg/m2  SpO2 100%  LMP 09/21/2015 Physical  Exam  Constitutional: She is oriented to person, place, and time. She appears well-developed and well-nourished. No distress.  HENT:  Head: Normocephalic and atraumatic.  Right Ear: Tympanic membrane and external ear normal.  Left Ear: Tympanic membrane and external ear normal.  Mouth/Throat: Oropharynx is clear and moist and mucous membranes are normal. No oral lesions. No trismus in the jaw. Abnormal dentition. No dental abscesses. No oropharyngeal exudate, posterior oropharyngeal edema or posterior oropharyngeal erythema.  Significant left maxillary and submandibular soft edema with erythema.  Tender to palpation.  No fluctuant mass or induration noted.  Gingival edema noted lateral to the left lower molar.  There is no pointing or draining abscess.  Eyes: Conjunctivae are normal.  Neck: Normal range of motion. Neck supple.  Cardiovascular: Normal  rate and normal heart sounds.   Pulmonary/Chest: Effort normal.  Musculoskeletal: Normal range of motion.  Lymphadenopathy:       Head (left side): Submandibular adenopathy present.    She has no cervical adenopathy.  Neurological: She is alert and oriented to person, place, and time.  Skin: Skin is warm and dry. No erythema.  Psychiatric: She has a normal mood and affect.    ED Course  Procedures (including critical care time) Labs Review Labs Reviewed  BASIC METABOLIC PANEL - Abnormal; Notable for the following:    Glucose, Bld 190 (*)    All other components within normal limits  CBC WITH DIFFERENTIAL/PLATELET - Abnormal; Notable for the following:    Hemoglobin 9.7 (*)    HCT 32.0 (*)    MCV 72.6 (*)    MCH 22.0 (*)    RDW 16.4 (*)    All other components within normal limits  POC URINE PREG, ED    Imaging Review Ct Maxillofacial W/cm  10/10/2015  CLINICAL DATA:  Left facial swelling.  Pain. EXAM: CT MAXILLOFACIAL WITH CONTRAST TECHNIQUE: Multidetector CT imaging of the maxillofacial structures was performed with intravenous  contrast. Multiplanar CT image reconstructions were also generated. A small metallic BB was placed on the right temple in order to reliably differentiate right from left. CONTRAST:  23mL ISOVUE-300 IOPAMIDOL (ISOVUE-300) INJECTION 61% COMPARISON:  None. FINDINGS: There is edema in the subcutaneous fat with skin thickening along the left side of the face. There is soft tissue edema adjacent to the left side of the mandible consistent with edema and inflammation. There is no drainable fluid collection to suggest an abscess. There is periapical lucency involving the third left mandibular molar concerning for infection. The globes are intact. The orbital walls are intact. The orbital floors are intact. The maxilla is intact. The mandible is intact. The zygomatic arches are intact. The nasal septum is midline. There is no nasal bone fracture. The temporomandibular joints are normal. The paranasal sinuses are clear. The visualized portions of the mastoid sinuses are well aerated. IMPRESSION: 1. Findings concerning for left facial cellulitis. Mild edema and expansion of the left masticator muscle likely reflecting mild myositis. No drainable fluid collection at this time to suggest an abscess. Periapical lucency involving the left third mandibular molar concerning for infection. Electronically Signed   By: Kathreen Devoid   On: 10/10/2015 15:13   I have personally reviewed and evaluated these images and lab results as part of my medical decision-making.   EKG Interpretation None      MDM   Final diagnoses:  Dental infection  Cellulitis of face    Patients  labs reviewed.  Radiological studies were viewed, interpreted and considered during the medical decision making and disposition process. I agree with radiologists reading.  Results were also discussed with patient.  Pt was also seen by Dr Lacinda Axon who agrees with need for admission.  She was given a dose of IV clindamycin while awaiting CT.  She continued to  have progressing swelling and erythema of lateral neck and face.  Discussed with Dr. Nehemiah Settle who will see pt and admit.  Unasyn IV ordered.    Evalee Jefferson, PA-C 10/10/15 [place], MD 10/13/15 1053

## 2015-10-11 DIAGNOSIS — Z833 Family history of diabetes mellitus: Secondary | ICD-10-CM | POA: Diagnosis not present

## 2015-10-11 DIAGNOSIS — I110 Hypertensive heart disease with heart failure: Secondary | ICD-10-CM | POA: Diagnosis present

## 2015-10-11 DIAGNOSIS — K047 Periapical abscess without sinus: Secondary | ICD-10-CM | POA: Diagnosis present

## 2015-10-11 DIAGNOSIS — L03211 Cellulitis of face: Secondary | ICD-10-CM | POA: Diagnosis present

## 2015-10-11 DIAGNOSIS — Z6837 Body mass index (BMI) 37.0-37.9, adult: Secondary | ICD-10-CM | POA: Diagnosis not present

## 2015-10-11 DIAGNOSIS — K0889 Other specified disorders of teeth and supporting structures: Secondary | ICD-10-CM | POA: Diagnosis not present

## 2015-10-11 DIAGNOSIS — Z7984 Long term (current) use of oral hypoglycemic drugs: Secondary | ICD-10-CM | POA: Diagnosis not present

## 2015-10-11 DIAGNOSIS — I5032 Chronic diastolic (congestive) heart failure: Secondary | ICD-10-CM | POA: Diagnosis present

## 2015-10-11 DIAGNOSIS — Z8249 Family history of ischemic heart disease and other diseases of the circulatory system: Secondary | ICD-10-CM | POA: Diagnosis not present

## 2015-10-11 DIAGNOSIS — E1165 Type 2 diabetes mellitus with hyperglycemia: Secondary | ICD-10-CM | POA: Diagnosis present

## 2015-10-11 DIAGNOSIS — I10 Essential (primary) hypertension: Secondary | ICD-10-CM | POA: Diagnosis not present

## 2015-10-11 DIAGNOSIS — E669 Obesity, unspecified: Secondary | ICD-10-CM | POA: Diagnosis present

## 2015-10-11 LAB — GLUCOSE, CAPILLARY
GLUCOSE-CAPILLARY: 190 mg/dL — AB (ref 65–99)
GLUCOSE-CAPILLARY: 186 mg/dL — AB (ref 65–99)
GLUCOSE-CAPILLARY: 240 mg/dL — AB (ref 65–99)
GLUCOSE-CAPILLARY: 169 mg/dL — AB (ref 65–99)

## 2015-10-11 LAB — BASIC METABOLIC PANEL
ANION GAP: 7 (ref 5–15)
BUN: 10 mg/dL (ref 6–20)
CALCIUM: 8.6 mg/dL — AB (ref 8.9–10.3)
CO2: 26 mmol/L (ref 22–32)
Chloride: 102 mmol/L (ref 101–111)
Creatinine, Ser: 0.61 mg/dL (ref 0.44–1.00)
GFR calc Af Amer: >60 mL/min (ref >60)
GFR calc non Af Amer: >60 mL/min (ref >60)
GLUCOSE: 189 mg/dL — AB (ref 65–99)
Potassium: 3.8 mmol/L (ref 3.5–5.1)
Sodium: 135 mmol/L (ref 135–145)

## 2015-10-11 LAB — CBC
HCT: 29.3 % — ABNORMAL LOW (ref 36.0–46.0)
HEMOGLOBIN: 8.8 g/dL — AB (ref 12.0–15.0)
MCH: 21.8 pg — AB (ref 26.0–34.0)
MCHC: 30 g/dL (ref 30.0–36.0)
MCV: 72.7 fL — ABNORMAL LOW (ref 78.0–100.0)
PLATELETS: 261 10*3/uL (ref 150–400)
RBC: 4.03 MIL/uL (ref 3.87–5.11)
RDW: 16.4 % — ABNORMAL HIGH (ref 11.5–15.5)
WBC: 7.2 10*3/uL (ref 4.0–10.5)

## 2015-10-11 MED ORDER — KETOROLAC TROMETHAMINE 30 MG/ML IJ SOLN
30.0000 mg | Freq: Four times a day (QID) | INTRAMUSCULAR | Status: DC | PRN
  Administered 2015-10-11 (×2): 30 mg via INTRAVENOUS
  Filled 2015-10-11 (×2): qty 1

## 2015-10-11 MED ORDER — SACUBITRIL-VALSARTAN 49-51 MG PO TABS
1.0000 | ORAL_TABLET | Freq: Two times a day (BID) | ORAL | Status: DC
  Administered 2015-10-11: 1 via ORAL
  Filled 2015-10-11 (×5): qty 1

## 2015-10-11 MED ORDER — SACUBITRIL-VALSARTAN 49-51 MG PO TABS
1.0000 | ORAL_TABLET | Freq: Two times a day (BID) | ORAL | Status: DC
  Administered 2015-10-12: 1 via ORAL
  Filled 2015-10-11 (×3): qty 1

## 2015-10-11 MED ORDER — SODIUM CHLORIDE 0.9 % IV SOLN
INTRAVENOUS | Status: AC
  Filled 2015-10-11: qty 3

## 2015-10-11 NOTE — Progress Notes (Signed)
Patient complaining of chest pain mid chest. Worse with deep breath. Patient had vomiting episode last night. Dr. Wyline Copas notified.

## 2015-10-11 NOTE — Progress Notes (Signed)
[facility] PROGRESS NOTE  Chelsey Santiago [id number] DOB: [birth year] DOA: 10/10/2015 PCP: Elyn Peers, MD  HPI/Brief narrative 40 y.o. female with a history of uncontrolled type 2 diabetes, hypertension, obesity, grade 1 diastolic heart failure with EF of 50-55%. She was diagnosed with dental abscess in the left mandibular molars and was prescribed first penicillin which she took on 10/07/15. She was seen the next day due to increasing swelling and pain and trismus. She was switched to clindamycin, which she took for 48 hours. With her face continued to swell, the patient was directed to the emergency department by her dentist for treatment. She has difficulty opening her mouth. Chewing increases her pain. The hypertrophic and pain medicine that she was given makes her pain improved. She denies fevers or chills, except for when she was in the emergency department 2 days ago. Her blood sugars have been elevated, although she reports having difficult control blood sugars.  Assessment/Plan: #1 dental infection  Failed outpatient treatment  Now on Unasyn 3 g every 6 hours  Swelling improving  Percocet and ibuprofen for pain  #2 hypertension  Holding ARB for 48 hours  Giving hydralazine 25 mg every 8 hours in place of ARB  #3 diabetes type 2  Holding metformin for 48 hours  CBGs every before meals and daily at bedtime  Cont sliding scale  Cont carb mod diet  Code Status: Full Family Communication: Pt in room Disposition Plan: Possible d/c home in 24hrs   Consultants:    Procedures:    Antibiotics: Anti-infectives    Start     Dose/Rate Route Frequency Ordered Stop   10/10/15 2000  Ampicillin-Sulbactam (UNASYN) 3 g in sodium chloride 0.9 % 100 mL IVPB     3 g 100 mL/hr over 60 Minutes Intravenous Every 6 hours 10/10/15 1913     10/10/15 1630  Ampicillin-Sulbactam (UNASYN) 3 g in sodium chloride 0.9 % 100 mL IVPB     3 g 100 mL/hr over 60 Minutes  Intravenous  Once 10/10/15 1625 10/10/15 1817   10/10/15 1615  Ampicillin-Sulbactam (UNASYN) 3 g in sodium chloride 0.9 % 100 mL IVPB  Status:  Discontinued     3 g 100 mL/hr over 60 Minutes Intravenous  Once 10/10/15 1606 10/10/15 1623   10/10/15 1230  clindamycin (CLEOCIN) IVPB 600 mg     600 mg 100 mL/hr over 30 Minutes Intravenous  Once 10/10/15 1217 10/10/15 1305      HPI/Subjective: Reports swelling has improved  Objective: Filed Vitals:   10/10/15 1721 10/10/15 2141 10/11/15 0534 10/11/15 0851  BP: 160/100 139/75 160/100 149/93  Pulse: 100 112 84 99  Temp: 98.5 F (36.9 C) 98.3 F (36.8 C) 98.1 F (36.7 C)   TempSrc: Oral Oral Oral   Resp: 18 18 18    Height:      Weight:      SpO2: 99% 99% 98%    No intake or output data in the 24 hours ending 10/11/15 1218 Filed Weights   10/10/15 1023  Weight: 106.595 kg (235 lb)    Exam:   General:  Awake, in nad  Cardiovascular: regular, s1, s2  Respiratory: normal resp effort, no wheezing  Abdomen: soft, nondistended  Musculoskeletal: perfused, no clubbing   Data Reviewed: Basic Metabolic Panel:  Recent Labs Lab 10/08/15 0905 10/10/15 1229 10/11/15 0615  NA 133* 136 135  K 3.9 3.9 3.8  CL 98* 101 102  CO2 25 28 26   GLUCOSE 267* 190* 189*  BUN 10 8 10   CREATININE 0.77 0.63 0.61  CALCIUM 8.7* 8.9 8.6*   Liver Function Tests:  Recent Labs Lab 10/08/15 0905  AST 102*  ALT 77*  ALKPHOS 140*  BILITOT 0.7  PROT 8.0  ALBUMIN 3.6    Recent Labs Lab 10/08/15 0905  LIPASE 17   No results for input(s): AMMONIA in the last 168 hours. CBC:  Recent Labs Lab 10/08/15 0905 10/10/15 1229 10/11/15 0615  WBC 10.1 8.9 7.2  NEUTROABS 8.3* 6.1  --   HGB 9.7* 9.7* 8.8*  HCT 32.1* 32.0* 29.3*  MCV 73.1* 72.6* 72.7*  PLT 215 232 261   Cardiac Enzymes: No results for input(s): CKTOTAL, CKMB, CKMBINDEX, TROPONINI in the last 168 hours. BNP (last 3 results)  Recent Labs  09/04/15 2320  BNP 184.0*     ProBNP (last 3 results) No results for input(s): PROBNP in the last 8760 hours.  CBG:  Recent Labs Lab 10/08/15 0920 10/10/15 2055 10/11/15 0827 10/11/15 1137  GLUCAP 239* 221* 190* 186*    No results found for this or any previous visit (from the past 240 hour(s)).   Studies: Ct Maxillofacial W/cm  10/10/2015  CLINICAL DATA:  Left facial swelling.  Pain. EXAM: CT MAXILLOFACIAL WITH CONTRAST TECHNIQUE: Multidetector CT imaging of the maxillofacial structures was performed with intravenous contrast. Multiplanar CT image reconstructions were also generated. A small metallic BB was placed on the right temple in order to reliably differentiate right from left. CONTRAST:  38mL ISOVUE-300 IOPAMIDOL (ISOVUE-300) INJECTION 61% COMPARISON:  None. FINDINGS: There is edema in the subcutaneous fat with skin thickening along the left side of the face. There is soft tissue edema adjacent to the left side of the mandible consistent with edema and inflammation. There is no drainable fluid collection to suggest an abscess. There is periapical lucency involving the third left mandibular molar concerning for infection. The globes are intact. The orbital walls are intact. The orbital floors are intact. The maxilla is intact. The mandible is intact. The zygomatic arches are intact. The nasal septum is midline. There is no nasal bone fracture. The temporomandibular joints are normal. The paranasal sinuses are clear. The visualized portions of the mastoid sinuses are well aerated. IMPRESSION: 1. Findings concerning for left facial cellulitis. Mild edema and expansion of the left masticator muscle likely reflecting mild myositis. No drainable fluid collection at this time to suggest an abscess. Periapical lucency involving the left third mandibular molar concerning for infection. Electronically Signed   By: Kathreen Devoid   On: 10/10/2015 15:13    Scheduled Meds: . amLODipine  10 mg Oral Daily  .  ampicillin-sulbactam (UNASYN) IV  3 g Intravenous Q6H  . aspirin EC  81 mg Oral Daily  . carvedilol  12.5 mg Oral BID WC  . citalopram  20 mg Oral Daily  . enoxaparin (LOVENOX) injection  40 mg Subcutaneous Q24H  . famotidine  20 mg Oral Daily  . hydrALAZINE  25 mg Oral Q8H  . insulin aspart  0-15 Units Subcutaneous TID WC  . insulin aspart  0-5 Units Subcutaneous QHS  . insulin aspart  4 Units Subcutaneous TID WC  . isosorbide-hydrALAZINE  1 tablet Oral TID  . linagliptin  5 mg Oral Daily  . potassium chloride SA  40 mEq Oral Daily  . pregabalin  75 mg Oral Daily  . spironolactone  25 mg Oral Daily  . torsemide  20 mg Oral Daily   Continuous Infusions:   Principal Problem:  Dental infection Active Problems:   Hypertension   DM type 2 (diabetes mellitus, type 2) ([place])   [facility], [facility] Pager [phone]. If 7PM-7AM, please contact night-coverage at [email or web address], password [email or web address] 10/11/2015, 12:18 PM

## 2015-10-12 LAB — GLUCOSE, CAPILLARY
GLUCOSE-CAPILLARY: 178 mg/dL — AB (ref 65–99)
GLUCOSE-CAPILLARY: 156 mg/dL — AB (ref 65–99)

## 2015-10-12 MED ORDER — ZOLPIDEM TARTRATE 5 MG PO TABS: 5.0000 mg | ORAL_TABLET | Freq: Every evening | ORAL | Status: DC | PRN

## 2015-10-12 MED ORDER — AMOXICILLIN-POT CLAVULANATE 875-125 MG PO TABS: 1.0000 | ORAL_TABLET | Freq: Two times a day (BID) | ORAL | Status: DC

## 2015-10-12 NOTE — Discharge Summary (Signed)
Physician Discharge Summary  Chelsey Santiago [id number] DOB: October 21, 1975 DOA: 10/10/2015  PCP: Elyn Peers, MD  Admit date: 10/10/2015 Discharge date: 10/12/2015  Time spent: 20 minutes  Recommendations for Outpatient Follow-up:  1. Follow up with PCP in 2-3 weeks 2. Follow up with dentist as already scheduled   Discharge Diagnoses:  Principal Problem:   Dental infection Active Problems:   Hypertension   DM type 2 (diabetes mellitus, type 2) ([place])   Discharge Condition: Improved  Diet recommendation: Diabetic  Filed Weights   10/10/15 1023  Weight: 106.595 kg (235 lb)    History of present illness:  Please review dictated H and P from 5/26 for details. Briefly, 40 y.o. female with a history of uncontrolled type 2 diabetes, hypertension, obesity, grade 1 diastolic heart failure with EF of 50-55%. She was diagnosed with dental abscess in the left mandibular molars and was prescribed first penicillin which she took on 10/07/15. She was seen the next day due to increasing swelling and pain and trismus. She was switched to clindamycin, which she took for 48 hours. With her face continued to swell, the patient was directed to the emergency department by her dentist for treatment. She has difficulty opening her mouth. Chewing increases her pain. The hypertrophic and pain medicine that she was given makes her pain improved. She denies fevers or chills, except for when she was in the emergency department 2 days ago. Her blood sugars have been elevated, although she reports having difficult control blood sugars.  Hospital Course:  #1 dental infection  Failed outpatient treatment  Patient was initially continued on Unasyn 3 g every 6 hours  Swelling improved  Continued Percocet and ibuprofen for pain  Patient remained afebrile with no leukocytosis  Patient to complete course of augmentin on discharge  #2 hypertension  Initially held ARB, resumed  Giving hydralazine 25 mg  every 8 hours in place of ARB  #3 diabetes type 2  Continued CBGs every before meals and daily at bedtime  Cont sliding scale while admitted  Cont carb mod diet  Discharge Exam: Filed Vitals:   10/11/15 0851 10/11/15 1259 10/11/15 2148 10/12/15 0505  BP: 149/93 134/84 133/78 162/95  Pulse: 99 93 92 91  Temp:  98.3 F (36.8 C) 98.2 F (36.8 C) 98.2 F (36.8 C)  TempSrc:   Oral Oral  Resp:  16 20 20   Height:      Weight:      SpO2:  94% 97% 97%    General: Awake, in nad Cardiovascular: regular, s1, s2 Respiratory: normal resp effort, no wheezing  Discharge Instructions     Medication List    STOP taking these medications        clindamycin 150 MG capsule  Commonly known as:  CLEOCIN      TAKE these medications        amLODipine 5 MG tablet  Commonly known as:  NORVASC  Take 2 tablets (10 mg total) by mouth daily.     amoxicillin-clavulanate 875-125 MG tablet  Commonly known as:  AUGMENTIN  Take 1 tablet by mouth 2 (two) times daily.     aspirin EC 81 MG tablet  Take 81 mg by mouth daily.     carvedilol 12.5 MG tablet  Commonly known as:  COREG  TAKE 3 TABLETS BY MOUTH TWICE DAILY WITH A MEAL.     citalopram 20 MG tablet  Commonly known as:  CELEXA  Take 20 mg by mouth daily.  isosorbide-hydrALAZINE 20-37.5 MG tablet  Commonly known as:  BIDIL  Take 1 tablet by mouth 3 (three) times daily.     metFORMIN 500 MG tablet  Commonly known as:  GLUCOPHAGE  Take 500 mg by mouth 2 (two) times daily with a meal.     nitroGLYCERIN 0.4 MG SL tablet  Commonly known as:  NITROSTAT  Place 1 tablet (0.4 mg total) under the tongue every 5 (five) minutes as needed for chest pain.     ondansetron 8 MG disintegrating tablet  Commonly known as:  ZOFRAN-ODT  Take 1 tablet (8 mg total) by mouth every 8 (eight) hours as needed for nausea or vomiting.     oxyCODONE-acetaminophen 5-325 MG tablet  Commonly known as:  PERCOCET  Take 1-2 tablets by mouth every 4  (four) hours as needed.     potassium chloride SA 20 MEQ tablet  Commonly known as:  K-DUR,KLOR-CON  Take 2 tablets (40 mEq total) by mouth daily.     pregabalin 75 MG capsule  Commonly known as:  LYRICA  Take 75 mg by mouth daily.     ranitidine 150 MG tablet  Commonly known as:  ZANTAC  Take 150 mg by mouth daily as needed for heartburn.     sacubitril-valsartan 49-51 MG  Commonly known as:  ENTRESTO  Take 1 tablet by mouth 2 (two) times daily.     sitaGLIPtin 100 MG tablet  Commonly known as:  JANUVIA  Take 100 mg by mouth daily.     spironolactone 25 MG tablet  Commonly known as:  ALDACTONE  Take 1 tablet (25 mg total) by mouth daily.     torsemide 20 MG tablet  Commonly known as:  DEMADEX  Take 1 tablet (20 mg total) by mouth daily.     zolpidem 5 MG tablet  Commonly known as:  AMBIEN  Take 1 tablet (5 mg total) by mouth at bedtime as needed for sleep.       Allergies  Allergen Reactions  . Diclofenac     Edema  . Tramadol Nausea And Vomiting  . Vicodin [Hydrocodone-Acetaminophen] Nausea Only   Follow-up Information    Follow up with Elyn Peers, MD. Schedule an appointment as soon as possible for a visit in 2 weeks.   Specialty:  Family Medicine   Why:  Hospital follow up   Contact information:   [place] [address] [phone]        The results of significant diagnostics from this hospitalization (including imaging, microbiology, ancillary and laboratory) are listed below for reference.    Significant Diagnostic Studies: Ct Maxillofacial W/cm  10/10/2015  CLINICAL DATA:  Left facial swelling.  Pain. EXAM: CT MAXILLOFACIAL WITH CONTRAST TECHNIQUE: Multidetector CT imaging of the maxillofacial structures was performed with intravenous contrast. Multiplanar CT image reconstructions were also generated. A small metallic BB was placed on the right temple in order to reliably differentiate right from left. CONTRAST:  47mL  ISOVUE-300 IOPAMIDOL (ISOVUE-300) INJECTION 61% COMPARISON:  None. FINDINGS: There is edema in the subcutaneous fat with skin thickening along the left side of the face. There is soft tissue edema adjacent to the left side of the mandible consistent with edema and inflammation. There is no drainable fluid collection to suggest an abscess. There is periapical lucency involving the third left mandibular molar concerning for infection. The globes are intact. The orbital walls are intact. The orbital floors are intact. The maxilla is intact. The mandible  is intact. The zygomatic arches are intact. The nasal septum is midline. There is no nasal bone fracture. The temporomandibular joints are normal. The paranasal sinuses are clear. The visualized portions of the mastoid sinuses are well aerated. IMPRESSION: 1. Findings concerning for left facial cellulitis. Mild edema and expansion of the left masticator muscle likely reflecting mild myositis. No drainable fluid collection at this time to suggest an abscess. Periapical lucency involving the left third mandibular molar concerning for infection. Electronically Signed   By: Kathreen Devoid   On: 10/10/2015 15:13    Microbiology: No results found for this or any previous visit (from the past 240 hour(s)).   Labs: Basic Metabolic Panel:  Recent Labs Lab 10/08/15 0905 10/10/15 1229 10/11/15 0615  NA 133* 136 135  K 3.9 3.9 3.8  CL 98* 101 102  CO2 25 28 26   GLUCOSE 267* 190* 189*  BUN 10 8 10   CREATININE 0.77 0.63 0.61  CALCIUM 8.7* 8.9 8.6*   Liver Function Tests:  Recent Labs Lab 10/08/15 0905  AST 102*  ALT 77*  ALKPHOS 140*  BILITOT 0.7  PROT 8.0  ALBUMIN 3.6    Recent Labs Lab 10/08/15 0905  LIPASE 17   No results for input(s): AMMONIA in the last 168 hours. CBC:  Recent Labs Lab 10/08/15 0905 10/10/15 1229 10/11/15 0615  WBC 10.1 8.9 7.2  NEUTROABS 8.3* 6.1  --   HGB 9.7* 9.7* 8.8*  HCT 32.1* 32.0* 29.3*  MCV 73.1* 72.6*  72.7*  PLT 215 232 261   Cardiac Enzymes: No results for input(s): CKTOTAL, CKMB, CKMBINDEX, TROPONINI in the last 168 hours. BNP: BNP (last 3 results)  Recent Labs  09/04/15 2320  BNP 184.0*    ProBNP (last 3 results) No results for input(s): PROBNP in the last 8760 hours.  CBG:  Recent Labs Lab 10/11/15 1137 10/11/15 1718 10/11/15 2046 10/12/15 0755 10/12/15 1148  GLUCAP 186* 240* 169* 178* 156*    Signed:  CHIU, STEPHEN K  [facility] 10/12/2015, 4:23 PM

## 2015-10-12 NOTE — Progress Notes (Signed)
Pt a/o.vss. Saline lock removed. Up ad lib. No c/o pain. Discharge instructions given. precriptions given. Pt verbalized understanding of instructions. Left floor ambulatory with nursing staff.

## 2015-10-13 ENCOUNTER — Encounter (HOSPITAL_COMMUNITY): Payer: Medicaid Other

## 2015-10-15 ENCOUNTER — Encounter (HOSPITAL_COMMUNITY): Payer: Medicaid Other

## 2015-10-17 ENCOUNTER — Encounter (HOSPITAL_COMMUNITY): Payer: Medicaid Other

## 2015-10-20 ENCOUNTER — Encounter (HOSPITAL_COMMUNITY): Payer: Medicaid Other

## 2015-10-22 ENCOUNTER — Encounter (HOSPITAL_COMMUNITY): Payer: Medicaid Other

## 2015-10-24 ENCOUNTER — Encounter (HOSPITAL_COMMUNITY): Payer: Medicaid Other

## 2015-10-27 ENCOUNTER — Encounter (HOSPITAL_COMMUNITY): Payer: Medicaid Other

## 2015-10-29 ENCOUNTER — Encounter (HOSPITAL_COMMUNITY): Payer: Medicaid Other

## 2015-10-31 ENCOUNTER — Encounter (HOSPITAL_COMMUNITY): Payer: Medicaid Other

## 2015-12-08 ENCOUNTER — Emergency Department (HOSPITAL_COMMUNITY): Payer: Medicaid Other

## 2015-12-08 ENCOUNTER — Encounter (HOSPITAL_COMMUNITY): Payer: Self-pay | Admitting: Emergency Medicine

## 2015-12-08 ENCOUNTER — Emergency Department (HOSPITAL_COMMUNITY)
Admission: EM | Admit: 2015-12-08 | Discharge: 2015-12-08 | Disposition: A | Discharge status: Home / Self Care | Payer: Medicaid Other | Attending: Emergency Medicine | Admitting: Emergency Medicine

## 2015-12-08 DIAGNOSIS — Z7984 Long term (current) use of oral hypoglycemic drugs: Secondary | ICD-10-CM | POA: Insufficient documentation

## 2015-12-08 DIAGNOSIS — E119 Type 2 diabetes mellitus without complications: Secondary | ICD-10-CM | POA: Diagnosis not present

## 2015-12-08 DIAGNOSIS — I2510 Atherosclerotic heart disease of native coronary artery without angina pectoris: Secondary | ICD-10-CM | POA: Insufficient documentation

## 2015-12-08 DIAGNOSIS — I10 Essential (primary) hypertension: Secondary | ICD-10-CM | POA: Diagnosis present

## 2015-12-08 DIAGNOSIS — I160 Hypertensive urgency: Secondary | ICD-10-CM | POA: Diagnosis not present

## 2015-12-08 DIAGNOSIS — Z79899 Other long term (current) drug therapy: Secondary | ICD-10-CM | POA: Diagnosis not present

## 2015-12-08 DIAGNOSIS — I5022 Chronic systolic (congestive) heart failure: Secondary | ICD-10-CM | POA: Insufficient documentation

## 2015-12-08 DIAGNOSIS — I110 Hypertensive heart disease with heart failure: Secondary | ICD-10-CM | POA: Diagnosis not present

## 2015-12-08 DIAGNOSIS — Z7982 Long term (current) use of aspirin: Secondary | ICD-10-CM | POA: Diagnosis not present

## 2015-12-08 DIAGNOSIS — R51 Headache: Secondary | ICD-10-CM | POA: Diagnosis not present

## 2015-12-08 DIAGNOSIS — R519 Headache, unspecified: Secondary | ICD-10-CM

## 2015-12-08 LAB — COMPREHENSIVE METABOLIC PANEL
ALBUMIN: 3.2 g/dL — AB (ref 3.5–5.0)
ALK PHOS: 90 U/L (ref 38–126)
ALT: 14 U/L (ref 14–54)
AST: 19 U/L (ref 15–41)
Anion gap: 10 (ref 5–15)
BILIRUBIN TOTAL: 0.3 mg/dL (ref 0.3–1.2)
BUN: 18 mg/dL (ref 6–20)
CO2: 26 mmol/L (ref 22–32)
CREATININE: 0.89 mg/dL (ref 0.44–1.00)
Calcium: 9.9 mg/dL (ref 8.9–10.3)
Chloride: 97 mmol/L — ABNORMAL LOW (ref 101–111)
GFR calc Af Amer: >60 mL/min (ref >60)
GLUCOSE: 245 mg/dL — AB (ref 65–99)
POTASSIUM: 3.5 mmol/L (ref 3.5–5.1)
Sodium: 133 mmol/L — ABNORMAL LOW (ref 135–145)
TOTAL PROTEIN: 7.1 g/dL (ref 6.5–8.1)

## 2015-12-08 LAB — CBC WITH DIFFERENTIAL/PLATELET
BASOS ABS: 0 10*3/uL (ref 0.0–0.1)
Basophils Relative: 0 %
EOS PCT: 2 %
Eosinophils Absolute: 0.2 10*3/uL (ref 0.0–0.7)
HEMATOCRIT: 34.5 % — AB (ref 36.0–46.0)
HEMOGLOBIN: 10.5 g/dL — AB (ref 12.0–15.0)
LYMPHS ABS: 3.6 10*3/uL (ref 0.7–4.0)
LYMPHS PCT: 33 %
MCH: 21.5 pg — ABNORMAL LOW (ref 26.0–34.0)
MCHC: 30.4 g/dL (ref 30.0–36.0)
MCV: 70.7 fL — AB (ref 78.0–100.0)
MONOS PCT: 5 %
Monocytes Absolute: 0.5 10*3/uL (ref 0.1–1.0)
NEUTROS PCT: 60 %
Neutro Abs: 6.6 10*3/uL (ref 1.7–7.7)
Platelets: 305 10*3/uL (ref 150–400)
RBC: 4.88 MIL/uL (ref 3.87–5.11)
RDW: 16.4 % — AB (ref 11.5–15.5)
WBC: 10.9 10*3/uL — AB (ref 4.0–10.5)

## 2015-12-08 LAB — URINALYSIS, ROUTINE W REFLEX MICROSCOPIC
BILIRUBIN URINE: NEGATIVE
GLUCOSE, UA: 100 mg/dL — AB
HGB URINE DIPSTICK: NEGATIVE
Ketones, ur: NEGATIVE mg/dL
Leukocytes, UA: NEGATIVE
Nitrite: NEGATIVE
PROTEIN: 100 mg/dL — AB
SPECIFIC GRAVITY, URINE: 1.029 (ref 1.005–1.030)
pH: 5.5 (ref 5.0–8.0)

## 2015-12-08 LAB — URINE MICROSCOPIC-ADD ON
RBC / HPF: NONE SEEN RBC/hpf (ref 0–5)
WBC, UA: NONE SEEN WBC/hpf (ref 0–5)

## 2015-12-08 LAB — TROPONIN I: Troponin I: <0.03 ng/mL (ref <0.03)

## 2015-12-08 MED ORDER — ISOSORB DINITRATE-HYDRALAZINE 20-37.5 MG PO TABS
1.0000 | ORAL_TABLET | Freq: Three times a day (TID) | ORAL | Status: DC
  Administered 2015-12-08: 1 via ORAL
  Filled 2015-12-08 (×2): qty 1

## 2015-12-08 MED ORDER — METOCLOPRAMIDE HCL 5 MG/ML IJ SOLN
10.0000 mg | Freq: Once | INTRAMUSCULAR | Status: AC
  Administered 2015-12-08: 10 mg via INTRAVENOUS
  Filled 2015-12-08: qty 2

## 2015-12-08 MED ORDER — KETOROLAC TROMETHAMINE 15 MG/ML IJ SOLN
15.0000 mg | Freq: Once | INTRAMUSCULAR | Status: AC
  Administered 2015-12-08: 15 mg via INTRAVENOUS
  Filled 2015-12-08: qty 1

## 2015-12-08 MED ORDER — AMLODIPINE BESYLATE 5 MG PO TABS
10.0000 mg | ORAL_TABLET | Freq: Every day | ORAL | Status: DC
  Administered 2015-12-08: 10 mg via ORAL
  Filled 2015-12-08: qty 2

## 2015-12-08 NOTE — ED Notes (Signed)
The pt s bp was checked in her lt arm pt c/o the cuff being too tight  She refused to have the bp checked in the other arm,  She has a history of migraine headaches also  No change in her headache

## 2015-12-08 NOTE — Discharge Instructions (Signed)
See your primary care doctor as soon as possible for optimal BP control. Dont take amlodipine today, as you got it in the ER. For the headache - take over the counter medicines.  Please return to the ER if the headache gets severe and in not improving, you have associated new one sided numbness, tingling, weakness or confusion, seizures, poor balance or poor vision.

## 2015-12-08 NOTE — ED Notes (Signed)
See notes

## 2015-12-08 NOTE — ED Triage Notes (Signed)
Pt. reports hypertension and headache for several days , pt. stated her daughter is in the hospital and has been in a lot of stress this week .

## 2015-12-08 NOTE — ED Provider Notes (Signed)
[facility] DEPT Provider Note   CSN: [id number] Arrival date & time: 12/08/15  0021  First Provider Contact:  None    By signing my name below, I, Evelene Croon, attest that this documentation has been prepared under the direction and in the presence of Varney Biles, MD . Electronically Signed: Evelene Croon, Scribe. 12/08/2015. 4:12 AM.  History   Chief Complaint Chief Complaint  Patient presents with  . Hypertension    The history is provided by the patient. No language interpreter was used.    HPI Comments:  Chelsey Santiago is a 40 y.o. female with a history of HTN, who presents to the Emergency Department complaining of a constant HA x 2 days. She notes her HA initially began 6 days ago and was intermittent until 2 days ago. She rates her pain a 10/10 PTA and a 8/10 at this time. The headache wasn't severe when it started 2 days ago. She has taken Advil with mild relief. She reports associated nausea. She denies fever, numbness, and unilateral weakness.  Pt is complaint with her HTN meds. She denies heavy smoking and illicit drug use. Pt also notes mild central CP which she describes as a pressure. She denies acute changes in  breathing.   Past Medical History:  Diagnosis Date  . Anemia    H&H of 10.6/33 and 07/2008 and 11.9/35 and 09/2010  . Anxiety   . CHF (congestive heart failure) ([place])   . Depression with anxiety   . Diabetes mellitus without complication ([place])   . Enlarged heart   . Fasting hyperglycemia   . Hypertension    Lab: Normal BMet except glucose of 118 in 09/2010  . Hypertensive heart disease 2009   Pulmonary edema postpartum; mild to moderate mitral regurgitation when hospitalized for CHF in 2009; Echocardiogram in 12/2009-no MR and normal EF; normal CXR in 09/2010  . Migraine headache   . Miscarriage 03/19/2013  . Obesity 04/16/2009  . Osteoarthritis, knee 03/29/2011  . Preeclampsia   . Pregnant   . Pulmonary edema   . Sleep apnea   . Threatened  abortion in early pregnancy 03/15/2013    Patient Active Problem List   Diagnosis Date Noted  . Dental infection 10/10/2015  . Chest pain 09/05/2015  . Systolic CHF, chronic ([place]) 09/05/2015  . LLQ pain   . DM type 2 (diabetes mellitus, type 2) ([place]) 04/28/2014  . Essential hypertension   . 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 Hypertensive urgency 04/22/2014  . Acute CHF ([place]) 04/22/2014  . S/P cesarean section 04/11/2014  . Pulmonary edema 04/11/2014  . Postoperative anemia 04/11/2014  . Elevated serum creatinine 04/11/2014  . Preeclampsia, severe 04/09/2014  . Pre-eclampsia superimposed on chronic hypertension, antepartum 04/08/2014  . Dyspnea 04/08/2014  . Polyhydramnios in third trimester, antepartum 03/14/2014  . Abnormal maternal glucose tolerance, antepartum 03/11/2014  . High-risk pregnancy 03/11/2014  . Chronic hypertension in pregnancy 03/11/2014  . Impaired glucose tolerance during pregnancy, antepartum 11/27/2013  . Fibroids 11/22/2013  . History of gestational diabetes in prior pregnancy, currently pregnant in first trimester 11/22/2013  . Hx of preeclampsia, prior pregnancy, currently pregnant 11/22/2013  . Short interval between pregnancies affecting pregnancy, antepartum 11/22/2013  . Supervision of high-risk pregnancy of elderly primigravida (>= 35 years old at delivery), third trimester 11/22/2013  . Miscarriage 03/19/2013  . 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 Hypertension   . Hypertensive heart disease   . Microcytic anemia   . Osteoarthritis, knee 03/29/2011  . Sleep apnea  12/09/2009  . Obesity 04/16/2009    Past Surgical History:  Procedure Laterality Date  . BREAST REDUCTION SURGERY  2002  . CESAREAN SECTION N/A 04/09/2014   Procedure: CESAREAN SECTION;  Surgeon: Mora Bellman, MD;  Location: [facility];  Service: Obstetrics;  Laterality: N/A;  . CHOLECYSTECTOMY      OB History    Gravida Para Term Preterm AB Living   11 6 5 1 5 6    SAB TAB  Ectopic Multiple Live Births   3 2   0         Home Medications    Prior to Admission medications   Medication Sig Start Date End Date Taking? Authorizing Provider  amLODipine (NORVASC) 5 MG tablet Take 2 tablets (10 mg total) by mouth daily. 02/24/15  Yes Lendon Colonel, NP  aspirin EC 81 MG tablet Take 81 mg by mouth daily.   Yes Historical Provider, MD  carvedilol (COREG) 12.5 MG tablet TAKE 3 TABLETS BY MOUTH TWICE DAILY WITH A MEAL. 08/21/15  Yes Arnoldo Lenis, MD  citalopram (CELEXA) 20 MG tablet Take 20 mg by mouth daily as needed (anxiety).    Yes Historical Provider, MD  isosorbide-hydrALAZINE (BIDIL) 20-37.5 MG tablet Take 1 tablet by mouth 3 (three) times daily.   Yes Historical Provider, MD  metFORMIN (GLUCOPHAGE) 500 MG tablet Take 500 mg by mouth 2 (two) times daily with a meal.   Yes Historical Provider, MD  nitroGLYCERIN (NITROSTAT) 0.4 MG SL tablet Place 1 tablet (0.4 mg total) under the tongue every 5 (five) minutes as needed for chest pain. 09/05/15  Yes Kathie Dike, MD  ondansetron (ZOFRAN-ODT) 8 MG disintegrating tablet Take 1 tablet (8 mg total) by mouth every 8 (eight) hours as needed for nausea or vomiting. 10/07/15  Yes Fraser Din Focht, PA  potassium chloride SA (K-DUR,KLOR-CON) 20 MEQ tablet Take 2 tablets (40 mEq total) by mouth daily. 03/27/15  Yes Lendon Colonel, NP  pregabalin (LYRICA) 75 MG capsule Take 75 mg by mouth daily.   Yes Historical Provider, MD  ranitidine (ZANTAC) 150 MG tablet Take 150 mg by mouth daily as needed for heartburn.   Yes Historical Provider, MD  sacubitril-valsartan (ENTRESTO) 49-51 MG Take 1 tablet by mouth 2 (two) times daily. 09/05/15  Yes Kathie Dike, MD  sitaGLIPtin (JANUVIA) 100 MG tablet Take 100 mg by mouth daily.   Yes Historical Provider, MD  spironolactone (ALDACTONE) 25 MG tablet Take 1 tablet (25 mg total) by mouth daily. 02/24/15  Yes Lendon Colonel, NP  torsemide (DEMADEX) 20 MG tablet Take 1 tablet (20 mg  total) by mouth daily. 02/24/15  Yes Lendon Colonel, NP    Family History Family History  Problem Relation Age of Onset  . Diabetes Mother   . Heart disease Mother   . Hyperlipidemia Paternal Grandfather   . Hypertension Paternal Grandfather   . Heart disease Father   . Heart disease Maternal Grandmother   . Hypertension Maternal Uncle   . Heart attack Brother   . Sudden death Neg Hx     Social History Social History  Substance Use Topics  . Smoking status: Never Smoker  . Smokeless tobacco: Never Used  . Alcohol use No     Comment: occ     Allergies   Diclofenac; Tramadol; and Vicodin [hydrocodone-acetaminophen]   Review of Systems Review of Systems 10 systems reviewed and all are negative for acute change except as noted in the HPI.  Physical Exam  Updated Vital Signs BP (!) 182/110   Pulse 108   Temp 97.4 F (36.3 C) (Oral)   Resp 23   Ht 5\' 6"  (1.676 m)   Wt 246 lb (111.6 kg)   SpO2 96%   BMI 39.71 kg/m   Physical Exam  Constitutional: She is oriented to person, place, and time. She appears well-developed and well-nourished. No distress.  HENT:  Head: Normocephalic and atraumatic.  Eyes: Conjunctivae are normal.  Neck: Normal range of motion. No JVD present. Carotid bruit is not present.  No meningismus   Cardiovascular: Regular rhythm, normal heart sounds and intact distal pulses.  Tachycardia present.   Pulmonary/Chest: Effort normal and breath sounds normal. No respiratory distress.  Lungs clear to auscultation  Abdominal: Soft. There is no tenderness.  Musculoskeletal: Normal range of motion.  Neurological: She is alert and oriented to person, place, and time. No cranial nerve deficit.  BUE and BLE strength 4+/5  Skin: Skin is warm and dry.  Psychiatric: She has a normal mood and affect.  Nursing note and vitals reviewed.    ED Treatments / Results  DIAGNOSTIC STUDIES:  Oxygen Saturation is 100% on RA, normal by my interpretation.     COORDINATION OF CARE:  4:07 AM Discussed treatment plan with pt at bedside and pt agreed to plan.  Labs (all labs ordered are listed, but only abnormal results are displayed) Labs Reviewed  CBC WITH DIFFERENTIAL/PLATELET - Abnormal; Notable for the following:       Result Value   WBC 10.9 (*)    Hemoglobin 10.5 (*)    HCT 34.5 (*)    MCV 70.7 (*)    MCH 21.5 (*)    RDW 16.4 (*)    All other components within normal limits  COMPREHENSIVE METABOLIC PANEL - Abnormal; Notable for the following:    Sodium 133 (*)    Chloride 97 (*)    Glucose, Bld 245 (*)    Albumin 3.2 (*)    All other components within normal limits  URINALYSIS, ROUTINE W REFLEX MICROSCOPIC (NOT AT [facility]) - Abnormal; Notable for the following:    APPearance CLOUDY (*)    Glucose, UA 100 (*)    Protein, ur 100 (*)    All other components within normal limits  URINE MICROSCOPIC-ADD ON - Abnormal; Notable for the following:    Squamous Epithelial / LPF 0-5 (*)    Bacteria, UA RARE (*)    Crystals CA OXALATE CRYSTALS (*)    All other components within normal limits  TROPONIN I    EKG  EKG Interpretation  Date/Time:  Monday December 08 2015 05:03:11 EDT Ventricular Rate:  104 PR Interval:    QRS Duration: 91 QT Interval:  390 QTC Calculation: 513 R Axis:   31 Text Interpretation:  Sinus tachycardia Prolonged QT interval No acute changes Nonspecific ST abnormality No significant change since last tracing Confirmed by Kathrynn Humble, MD, Thelma Comp [phone]) on 12/08/2015 6:57:43 AM       Radiology Ct Head Wo Contrast  Result Date: 12/08/2015 CLINICAL DATA:  Initial evaluation for acute headache, elevated blood pressure. EXAM: CT HEAD WITHOUT CONTRAST TECHNIQUE: Contiguous axial images were obtained from the base of the skull through the vertex without intravenous contrast. COMPARISON:  None. FINDINGS: There is no acute intracranial hemorrhage or infarct. No mass lesion or midline shift. Gray-white matter differentiation  is well maintained. Ventricles are normal in size without evidence of hydrocephalus. CSF containing spaces are within normal limits. No extra-axial  fluid collection. Mild chronic microvascular ischemic changes. The calvarium is intact. Orbital soft tissues are within normal limits. The paranasal sinuses and mastoid air cells are well pneumatized and free of fluid. Scalp soft tissues are unremarkable. IMPRESSION: 1. No acute intracranial process. 2. Mild chronic small vessel ischemic disease. Electronically Signed   By: Jeannine Boga M.D.   On: 12/08/2015 05:48   Procedures Procedures  Medications Ordered in ED Medications  amLODipine (NORVASC) tablet 10 mg (10 mg Oral Given 12/08/15 0525)  isosorbide-hydrALAZINE (BIDIL) 20-37.5 MG per tablet 1 tablet (1 tablet Oral Given 12/08/15 0547)  metoCLOPramide (REGLAN) injection 10 mg (10 mg Intravenous Given 12/08/15 0523)  ketorolac (TORADOL) 15 MG/ML injection 15 mg (15 mg Intravenous Given 12/08/15 0529)     Initial Impression / Assessment and Plan / ED Course  I have reviewed the triage vital signs and the nursing notes.  Pertinent labs & imaging results that were available during my care of the patient were reviewed by me and considered in my medical decision making (see chart for details).  Clinical Course  Comment By Time  Upon reassessment, patient reports that the headache has almost resolved. She continued to have no neurologic complains. Strict return precautions discussed, pt will return to the ER if there is visual complains, seizures, altered mental status, loss of consciousness, dizziness, new focal weakness, or numbness.  Varney Biles, MD 07/24 0630     Final Clinical Impressions(s) / ED Diagnoses   Final diagnoses:  Asymptomatic hypertensive urgency  Acute nonintractable headache, unspecified headache type    New Prescriptions Discharge Medication List as of 12/08/2015  6:49 AM     I personally performed the  services described in this documentation, which was scribed in my presence. The recorded information has been reviewed and is accurate.  Pt comes in with cc of headache with elevated BP and some chest discomfort. She has hx of HTN, CHF. No neuro deficits or complains. Lungs are clear-  No signs of CHF exacerbations. Pt's BP was severely elevated when she first arrived, but has trended down to 180s/100s by itself. Pain also contributing to the BP along with stress of daughter admitted for sepsis.  - CT head. Give toradol and reglan if neg. R/o bleed. Concerns for [facility] is low at the moment, given no thunderclap headaches. - Trop and ekg for the chest pain.  - Reassess post workup and meds to see if LP is needed.      Varney Biles, MD 12/08/15 [phone]

## 2015-12-08 NOTE — ED Notes (Signed)
Dr. Dina Rich notified on pt.'s elevated blood pressure and headache .

## 2015-12-19 ENCOUNTER — Emergency Department (HOSPITAL_COMMUNITY): Payer: Medicaid Other

## 2015-12-19 ENCOUNTER — Inpatient Hospital Stay (HOSPITAL_COMMUNITY)
Admission: EM | Admit: 2015-12-19 | Discharge: 2015-12-22 | DRG: 287 | Disposition: A | Discharge status: Home / Self Care | Payer: Medicaid Other | Attending: Internal Medicine | Admitting: Internal Medicine

## 2015-12-19 ENCOUNTER — Encounter (HOSPITAL_COMMUNITY): Payer: Self-pay

## 2015-12-19 DIAGNOSIS — F418 Other specified anxiety disorders: Secondary | ICD-10-CM | POA: Diagnosis present

## 2015-12-19 DIAGNOSIS — R079 Chest pain, unspecified: Secondary | ICD-10-CM | POA: Diagnosis present

## 2015-12-19 DIAGNOSIS — E66812 Obesity, class 2: Secondary | ICD-10-CM | POA: Diagnosis present

## 2015-12-19 DIAGNOSIS — Z8249 Family history of ischemic heart disease and other diseases of the circulatory system: Secondary | ICD-10-CM

## 2015-12-19 DIAGNOSIS — E119 Type 2 diabetes mellitus without complications: Secondary | ICD-10-CM | POA: Diagnosis present

## 2015-12-19 DIAGNOSIS — Z885 Allergy status to narcotic agent status: Secondary | ICD-10-CM | POA: Diagnosis not present

## 2015-12-19 DIAGNOSIS — E669 Obesity, unspecified: Secondary | ICD-10-CM | POA: Diagnosis not present

## 2015-12-19 DIAGNOSIS — I119 Hypertensive heart disease without heart failure: Secondary | ICD-10-CM | POA: Diagnosis present

## 2015-12-19 DIAGNOSIS — R7989 Other specified abnormal findings of blood chemistry: Secondary | ICD-10-CM | POA: Diagnosis present

## 2015-12-19 DIAGNOSIS — E785 Hyperlipidemia, unspecified: Secondary | ICD-10-CM | POA: Diagnosis present

## 2015-12-19 DIAGNOSIS — Z7984 Long term (current) use of oral hypoglycemic drugs: Secondary | ICD-10-CM | POA: Diagnosis not present

## 2015-12-19 DIAGNOSIS — Z888 Allergy status to other drugs, medicaments and biological substances status: Secondary | ICD-10-CM | POA: Diagnosis not present

## 2015-12-19 DIAGNOSIS — I5022 Chronic systolic (congestive) heart failure: Secondary | ICD-10-CM | POA: Diagnosis present

## 2015-12-19 DIAGNOSIS — I110 Hypertensive heart disease with heart failure: Secondary | ICD-10-CM | POA: Diagnosis present

## 2015-12-19 DIAGNOSIS — Z7982 Long term (current) use of aspirin: Secondary | ICD-10-CM

## 2015-12-19 DIAGNOSIS — G4733 Obstructive sleep apnea (adult) (pediatric): Secondary | ICD-10-CM | POA: Diagnosis present

## 2015-12-19 DIAGNOSIS — Z0389 Encounter for observation for other suspected diseases and conditions ruled out: Secondary | ICD-10-CM

## 2015-12-19 DIAGNOSIS — IMO0001 Reserved for inherently not codable concepts without codable children: Secondary | ICD-10-CM

## 2015-12-19 DIAGNOSIS — Z79899 Other long term (current) drug therapy: Secondary | ICD-10-CM

## 2015-12-19 DIAGNOSIS — R0789 Other chest pain: Secondary | ICD-10-CM | POA: Diagnosis present

## 2015-12-19 DIAGNOSIS — R778 Other specified abnormalities of plasma proteins: Secondary | ICD-10-CM | POA: Diagnosis present

## 2015-12-19 DIAGNOSIS — I43 Cardiomyopathy in diseases classified elsewhere: Secondary | ICD-10-CM

## 2015-12-19 DIAGNOSIS — D509 Iron deficiency anemia, unspecified: Secondary | ICD-10-CM | POA: Diagnosis present

## 2015-12-19 DIAGNOSIS — I10 Essential (primary) hypertension: Secondary | ICD-10-CM | POA: Diagnosis present

## 2015-12-19 DIAGNOSIS — Z833 Family history of diabetes mellitus: Secondary | ICD-10-CM

## 2015-12-19 DIAGNOSIS — Z9989 Dependence on other enabling machines and devices: Secondary | ICD-10-CM

## 2015-12-19 LAB — CBC WITH DIFFERENTIAL/PLATELET
BAND NEUTROPHILS: 0 %
BASOS ABS: 0.1 10*3/uL (ref 0.0–0.1)
Basophils Relative: 1 %
Blasts: 0 %
EOS PCT: 0 %
Eosinophils Absolute: 0 10*3/uL (ref 0.0–0.7)
HCT: 32.1 % — ABNORMAL LOW (ref 36.0–46.0)
Hemoglobin: 9.9 g/dL — ABNORMAL LOW (ref 12.0–15.0)
LYMPHS ABS: 2.7 10*3/uL (ref 0.7–4.0)
LYMPHS PCT: 20 %
MCH: 21.8 pg — ABNORMAL LOW (ref 26.0–34.0)
MCHC: 30.8 g/dL (ref 30.0–36.0)
MCV: 70.5 fL — AB (ref 78.0–100.0)
METAMYELOCYTES PCT: 0 %
MONOS PCT: 4 %
Monocytes Absolute: 0.5 10*3/uL (ref 0.1–1.0)
Myelocytes: 0 %
NEUTROS ABS: 10.1 10*3/uL — AB (ref 1.7–7.7)
Neutrophils Relative %: 75 %
OTHER: 0 %
PLATELETS: 253 10*3/uL (ref 150–400)
Promyelocytes Absolute: 0 %
RBC: 4.55 MIL/uL (ref 3.87–5.11)
RDW: 16.4 % — AB (ref 11.5–15.5)
Smear Review: ADEQUATE
WBC: 13.4 10*3/uL — AB (ref 4.0–10.5)
nRBC: 0 /100 WBC

## 2015-12-19 LAB — PROTIME-INR
INR: 0.98
Prothrombin Time: 13 seconds (ref 11.4–15.2)

## 2015-12-19 LAB — COMPREHENSIVE METABOLIC PANEL
ALT: 20 U/L (ref 14–54)
ANION GAP: 11 (ref 5–15)
AST: 23 U/L (ref 15–41)
Albumin: 3.5 g/dL (ref 3.5–5.0)
Alkaline Phosphatase: 84 U/L (ref 38–126)
BUN: 10 mg/dL (ref 6–20)
CHLORIDE: 97 mmol/L — AB (ref 101–111)
CO2: 25 mmol/L (ref 22–32)
Calcium: 8.7 mg/dL — ABNORMAL LOW (ref 8.9–10.3)
Creatinine, Ser: 0.93 mg/dL (ref 0.44–1.00)
Glucose, Bld: 269 mg/dL — ABNORMAL HIGH (ref 65–99)
POTASSIUM: 2.9 mmol/L — AB (ref 3.5–5.1)
Sodium: 133 mmol/L — ABNORMAL LOW (ref 135–145)
TOTAL PROTEIN: 7.8 g/dL (ref 6.5–8.1)
Total Bilirubin: 0.6 mg/dL (ref 0.3–1.2)

## 2015-12-19 LAB — APTT: aPTT: 26 seconds (ref 24–36)

## 2015-12-19 LAB — TROPONIN I: TROPONIN I: 0.05 ng/mL — AB (ref <0.03)

## 2015-12-19 MED ORDER — HEPARIN BOLUS VIA INFUSION: 4000.0000 [IU] | Freq: Once | INTRAVENOUS | Status: DC

## 2015-12-19 MED ORDER — HEPARIN SODIUM (PORCINE) 5000 UNIT/ML IJ SOLN
4000.0000 [IU] | Freq: Once | INTRAMUSCULAR | Status: AC
  Administered 2015-12-20: 4000 [IU] via INTRAVENOUS
  Filled 2015-12-19: qty 1

## 2015-12-19 MED ORDER — HEPARIN (PORCINE) IN NACL 100-0.45 UNIT/ML-% IJ SOLN: 1100.0000 [IU]/h | INTRAMUSCULAR | Status: DC

## 2015-12-19 MED ORDER — ONDANSETRON HCL 4 MG/2ML IJ SOLN
4.0000 mg | Freq: Once | INTRAMUSCULAR | Status: AC
  Administered 2015-12-19: 4 mg via INTRAVENOUS
  Filled 2015-12-19: qty 2

## 2015-12-19 MED ORDER — GI COCKTAIL ~~LOC~~
30.0000 mL | Freq: Once | ORAL | Status: AC
  Administered 2015-12-19: 30 mL via ORAL
  Filled 2015-12-19: qty 30

## 2015-12-19 NOTE — ED Triage Notes (Signed)
Pt reports mid sternal chest pain that started approx 30 minutes prior to calling ems.  Pt took her own 3 nitro's without relief.  Pt has slight relief from ems with the 4th nitro.   Pt also received 4 baby aspirin by ems.

## 2015-12-19 NOTE — Progress Notes (Signed)
ANTICOAGULATION CONSULT NOTE - Preliminary  Pharmacy Consult for heparin Indication: chest pain/ACS  Allergies  Allergen Reactions  . Diclofenac     Edema  . Tramadol Nausea And Vomiting  . Vicodin [Hydrocodone-Acetaminophen] Nausea Only    Patient Measurements: Height: 5\' 6"  (167.6 cm) Weight: 238 lb (108 kg) IBW/kg (Calculated) : 59.3 HEPARIN DW (KG): 84.3   Vital Signs: Temp: 97.5 F (36.4 C) (08/04 2110) Temp Source: Oral (08/04 2110) BP: 117/85 (08/04 2300) Pulse Rate: 81 (08/04 2230)  Labs:  Recent Labs  12/19/15 2131  HGB 9.9*  HCT 32.1*  PLT 253  CREATININE 0.93  TROPONINI 0.05*   Estimated Creatinine Clearance: 100 mL/min (by C-G formula based on SCr of 0.93 mg/dL).  Medical History: Past Medical History:  Diagnosis Date  . Anemia    H&H of 10.6/33 and 07/2008 and 11.9/35 and 09/2010  . Anxiety   . CHF (congestive heart failure) ([place])   . Depression with anxiety   . Diabetes mellitus without complication ([place])   . Enlarged heart   . Fasting hyperglycemia   . Hypertension    Lab: Normal BMet except glucose of 118 in 09/2010  . Hypertensive heart disease 2009   Pulmonary edema postpartum; mild to moderate mitral regurgitation when hospitalized for CHF in 2009; Echocardiogram in 12/2009-no MR and normal EF; normal CXR in 09/2010  . Migraine headache   . Miscarriage 03/19/2013  . Obesity 04/16/2009  . Osteoarthritis, knee 03/29/2011  . Preeclampsia   . Pregnant   . Pulmonary edema   . Sleep apnea   . Threatened abortion in early pregnancy 03/15/2013    Medications:  Scheduled:  . heparin  4,000 Units Intravenous Once   Infusions:  . heparin     PRN:  Anti-infectives    None      Assessment: 40 yo female in ED with mid-sternal chest pain. No relief until 4th NTG and ASA in EMS.  Troponin elevated, starting heparin and plan to transfer to [facility] for possible cath in morning.   Goal of Therapy:  Heparin level 0.3-0.7 units/ml   Plan:   Give 4000 units bolus x 1 Start heparin infusion at 1100 units/hr Check anti-Xa level in 6 hours and daily while on heparin Continue to monitor H&H and platelets Preliminary review of pertinent patient information completed.  Forestine Na clinical pharmacist will complete review during morning rounds to assess the patient and finalize treatment regimen.  Nyra Capes, RPH 12/19/2015,11:48 PM

## 2015-12-19 NOTE — ED Provider Notes (Signed)
[facility] DEPT Provider Note   CSN: [id number] Arrival date & time: 12/19/15  2101  First Provider Contact: 12/19/2015 9:33 PM   By signing my name below, I, Chelsey Santiago, attest that this documentation has been prepared under the direction and in the presence of Margette Fast, MD. Electronically Signed: Tedra Coupe. Sheppard Coil, ED Scribe. 12/19/15. 9:56 PM.  History   Chief Complaint Chief Complaint  Patient presents with  . Chest Pain    HPI HPI Comments: Chelsey Santiago is a 40 y.o. female brought in by ambulance, with PMHx of CHF, DM, HTN, and Anemia who presents to the Emergency Department complaining of sudden onset, constant, "pressure-like" mid-sternal chest pain x 30 min PTA. Pt had associated diaphoresis, weakness, tingling of her hands, nausea and lightheadedness. Pt had no relief of pain after taking 3 NTG. However, after receiving 4th NTG and 324 mg of ASA by EMS pt reports mild relief of chest pain. Denies any prior stress test or heart catheterization procedures, but she does have a cardiologist, [place]. No upcoming appointment. She reports her most recent chest pain episode was in April. Denies any fever, vomiting, or diarrhea. Pt is not a smoker.  The history is provided by the patient. No language interpreter was used.   Past Medical History:  Diagnosis Date  . Anemia    H&H of 10.6/33 and 07/2008 and 11.9/35 and 09/2010  . Anxiety   . CHF (congestive heart failure) ([place])   . Depression with anxiety   . Diabetes mellitus without complication ([place])   . Enlarged heart   . Fasting hyperglycemia   . Hypertension    Lab: Normal BMet except glucose of 118 in 09/2010  . Hypertensive heart disease 2009   Pulmonary edema postpartum; mild to moderate mitral regurgitation when hospitalized for CHF in 2009; Echocardiogram in 12/2009-no MR and normal EF; normal CXR in 09/2010  . Migraine headache   . Miscarriage 03/19/2013  . Obesity 04/16/2009  . Osteoarthritis, knee  03/29/2011  . Preeclampsia   . Pregnant   . Pulmonary edema   . Sleep apnea   . Threatened abortion in early pregnancy 03/15/2013    Patient Active Problem List   Diagnosis Date Noted  . Dental infection 10/10/2015  . Chest pain 09/05/2015  . Systolic CHF, chronic ([place]) 09/05/2015  . LLQ pain   . DM type 2 (diabetes mellitus, type 2) ([place]) 04/28/2014  . Essential hypertension   . 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 Hypertensive urgency 04/22/2014  . Acute CHF ([place]) 04/22/2014  . S/P cesarean section 04/11/2014  . Pulmonary edema 04/11/2014  . Postoperative anemia 04/11/2014  . Elevated serum creatinine 04/11/2014  . Preeclampsia, severe 04/09/2014  . Pre-eclampsia superimposed on chronic hypertension, antepartum 04/08/2014  . Dyspnea 04/08/2014  . Polyhydramnios in third trimester, antepartum 03/14/2014  . Abnormal maternal glucose tolerance, antepartum 03/11/2014  . High-risk pregnancy 03/11/2014  . Chronic hypertension in pregnancy 03/11/2014  . Impaired glucose tolerance during pregnancy, antepartum 11/27/2013  . Fibroids 11/22/2013  . History of gestational diabetes in prior pregnancy, currently pregnant in first trimester 11/22/2013  . Hx of preeclampsia, prior pregnancy, currently pregnant 11/22/2013  . Short interval between pregnancies affecting pregnancy, antepartum 11/22/2013  . Supervision of high-risk pregnancy of elderly primigravida (>= 36 years old at delivery), third trimester 11/22/2013  . Miscarriage 03/19/2013  . 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 Hypertension   . Hypertensive heart disease   . Microcytic anemia   . Osteoarthritis, knee 03/29/2011  .  Sleep apnea 12/09/2009  . Obesity 04/16/2009    Past Surgical History:  Procedure Laterality Date  . BREAST REDUCTION SURGERY  2002  . CESAREAN SECTION N/A 04/09/2014   Procedure: CESAREAN SECTION;  Surgeon: Mora Bellman, MD;  Location: [facility];  Service: Obstetrics;  Laterality: N/A;  .  CHOLECYSTECTOMY      OB History    Gravida Para Term Preterm AB Living   11 6 5 1 5 6    SAB TAB Ectopic Multiple Live Births   3 2   0 6       Home Medications    Prior to Admission medications   Medication Sig Start Date End Date Taking? Authorizing Provider  amLODipine (NORVASC) 5 MG tablet Take 2 tablets (10 mg total) by mouth daily. 02/24/15  Yes Lendon Colonel, NP  aspirin EC 81 MG tablet Take 81 mg by mouth daily.   Yes Historical Provider, MD  carvedilol (COREG) 12.5 MG tablet TAKE 3 TABLETS BY MOUTH TWICE DAILY WITH A MEAL. 08/21/15  Yes Arnoldo Lenis, MD  citalopram (CELEXA) 20 MG tablet Take 20 mg by mouth daily as needed (anxiety).    Yes Historical Provider, MD  isosorbide-hydrALAZINE (BIDIL) 20-37.5 MG tablet Take 1 tablet by mouth 3 (three) times daily.   Yes Historical Provider, MD  metFORMIN (GLUCOPHAGE) 500 MG tablet Take 500 mg by mouth 2 (two) times daily with a meal.   Yes Historical Provider, MD  nitroGLYCERIN (NITROSTAT) 0.4 MG SL tablet Place 1 tablet (0.4 mg total) under the tongue every 5 (five) minutes as needed for chest pain. 09/05/15  Yes Kathie Dike, MD  ondansetron (ZOFRAN-ODT) 8 MG disintegrating tablet Take 1 tablet (8 mg total) by mouth every 8 (eight) hours as needed for nausea or vomiting. 10/07/15  Yes Fraser Din Focht, PA  potassium chloride SA (K-DUR,KLOR-CON) 20 MEQ tablet Take 2 tablets (40 mEq total) by mouth daily. 03/27/15  Yes Lendon Colonel, NP  pregabalin (LYRICA) 75 MG capsule Take 75 mg by mouth daily.   Yes Historical Provider, MD  ranitidine (ZANTAC) 150 MG tablet Take 150 mg by mouth daily as needed for heartburn.   Yes Historical Provider, MD  sacubitril-valsartan (ENTRESTO) 49-51 MG Take 1 tablet by mouth 2 (two) times daily. 09/05/15  Yes Kathie Dike, MD  sitaGLIPtin (JANUVIA) 100 MG tablet Take 100 mg by mouth daily.   Yes Historical Provider, MD  spironolactone (ALDACTONE) 25 MG tablet Take 1 tablet (25 mg total) by  mouth daily. 02/24/15  Yes Lendon Colonel, NP  torsemide (DEMADEX) 20 MG tablet Take 1 tablet (20 mg total) by mouth daily. 02/24/15  Yes Lendon Colonel, NP    Family History Family History  Problem Relation Age of Onset  . Diabetes Mother   . Heart disease Mother   . Hyperlipidemia Paternal Grandfather   . Hypertension Paternal Grandfather   . Heart disease Father   . Heart disease Maternal Grandmother   . Hypertension Maternal Uncle   . Heart attack Brother   . Sudden death Neg Hx     Social History Social History  Substance Use Topics  . Smoking status: Never Smoker  . Smokeless tobacco: Never Used  . Alcohol use No     Comment: occ     Allergies   Diclofenac; Tramadol; and Vicodin [hydrocodone-acetaminophen]   Review of Systems Review of Systems  Constitutional: Positive for diaphoresis. Negative for fever.  Cardiovascular: Positive for chest pain.  Gastrointestinal: Positive for  nausea. Negative for diarrhea and vomiting.  Neurological: Positive for weakness and light-headedness.  All other systems reviewed and are negative.  Physical Exam Updated Vital Signs BP (!) 99/52   Pulse 81   Temp 97.5 F (36.4 C) (Oral)   Resp 21   Ht 5\' 6"  (1.676 m)   Wt 238 lb (108 kg)   LMP 12/11/2015   SpO2 97%   BMI 38.41 kg/m   Physical Exam  Constitutional: Alert and oriented. Well appearing and in no acute distress. Eyes: Conjunctivae are normal. PERRL. EOMI. Head: Atraumatic. Nose: No congestion/rhinnorhea. Mouth/Throat: Mucous membranes are moist.  Oropharynx non-erythematous. Neck: No stridor. Cardiovascular: Normal rate, regular rhythm. Good peripheral circulation. Grossly normal heart sounds.   Respiratory: Normal respiratory effort.  No retractions. Lungs CTAB. Gastrointestinal: Soft and nontender. No distention.  Musculoskeletal: No lower extremity tenderness nor edema. No gross deformities of extremities. Neurologic:  Normal speech and language.  No gross focal neurologic deficits are appreciated.  Skin:  Skin is warm, dry and intact. No rash noted. Psychiatric: Mood and affect are normal. Speech and behavior are normal.  ED Treatments / Results  DIAGNOSTIC STUDIES: Oxygen Saturation is 100% on RA, normal by my interpretation.    COORDINATION OF CARE: 9:40 PM-Discussed treatment plan which includes CMP, CBC, Troponin, order of Zofran and GI cocktail with pt at bedside and pt agreed to plan.   Labs (all labs ordered are listed, but only abnormal results are displayed) Labs Reviewed  CBC WITH DIFFERENTIAL/PLATELET - Abnormal; Notable for the following:       Result Value   WBC 13.4 (*)    Hemoglobin 9.9 (*)    HCT 32.1 (*)    MCV 70.5 (*)    MCH 21.8 (*)    RDW 16.4 (*)    Neutro Abs 10.1 (*)    All other components within normal limits  COMPREHENSIVE METABOLIC PANEL - Abnormal; Notable for the following:    Sodium 133 (*)    Potassium 2.9 (*)    Chloride 97 (*)    Glucose, Bld 269 (*)    Calcium 8.7 (*)    All other components within normal limits  TROPONIN I - Abnormal; Notable for the following:    Troponin I 0.05 (*)    All other components within normal limits    EKG  EKG Interpretation  Date/Time:  Friday December 19 2015 21:14:10 EDT Ventricular Rate:  79 PR Interval:    QRS Duration: 94 QT Interval:  435 QTC Calculation: 499 R Axis:   31 Text Interpretation:  Sinus rhythm Left ventricular hypertrophy Nonspecific T abnormalities, lateral leads Borderline prolonged QT interval No STEMI. Compared to prior tracing with new t wave inversions.  Confirmed by LONG MD, JOSHUA [id number]) on 12/19/2015 9:34:09 PM       Radiology Dg Chest Portable 1 View  Result Date: 12/19/2015 CLINICAL DATA:  Mid to left lower chest pain, onset today. EXAM: PORTABLE CHEST 1 VIEW COMPARISON:  09/05/2015 FINDINGS: Mild cardiomegaly. No pulmonary edema, confluent airspace disease, pleural effusion or pneumothorax. Osseous structures  appear normal per IMPRESSION: Cardiomegaly.  No localizing pulmonary process. Electronically Signed   By: Jeb Levering M.D.   On: 12/19/2015 21:31    Procedures Procedures (including critical care time)  Medications Ordered in ED Medications  ondansetron [facility]) injection 4 mg (4 mg Intravenous Given 12/19/15 2152)  gi cocktail (Maalox,Lidocaine,Donnatal) (30 mLs Oral Given 12/19/15 2151)   Initial Impression / Assessment and Plan / ED Course  I have reviewed the triage vital signs and the nursing notes.  Pertinent labs & imaging results that were available during my care of the patient were reviewed by me and considered in my medical decision making (see chart for details).  Clinical Course   Patient with concerning story of ischemia and EKG changes noted from prior tracings. Will follow biomarkers and reassess.   Differential includes all life-threatening causes for chest pain. This includes but is not exclusive to acute coronary syndrome, aortic dissection, pulmonary embolism, cardiac tamponade, community-acquired pneumonia, pericarditis, musculoskeletal chest wall pain, etc.  11:44 PM Patient is pain-free at this time. Discussed the case with, [facility] with cardiology. Given the EKG changes and mild elevation in troponin we plan to bolus with heparin and transferred to [facility] for possible cardiac catheterization in the morning. Updated the patient regarding the plan and she is pleased.   Patient reassess and is pain-free. Arranging transport to [facility] hospital for admission and possible heart cath.   Final Clinical Impressions(s) / ED Diagnoses   Final diagnoses:  Nonspecific chest pain    New Prescriptions None   The above note was documented with the assistance of a scribe. I have reviewed the note for accuracy.   Nanda Quinton, MD    Margette Fast, MD 12/20/15 [phone]

## 2015-12-19 NOTE — ED Notes (Signed)
CRITICAL VALUE ALERT  Critical value received:  Troponin 0.05  Date of notification:  12/19/15  Time of notification:  2223  Critical value read back:Yes.    Nurse who received alert:  bkn  MD notified (1st page):  Long  Time of first page:  2223  MD notified (2nd page):  Time of second page:  Responding MD:    Time MD responded:

## 2015-12-20 ENCOUNTER — Inpatient Hospital Stay (HOSPITAL_COMMUNITY): Payer: Medicaid Other

## 2015-12-20 DIAGNOSIS — I5022 Chronic systolic (congestive) heart failure: Secondary | ICD-10-CM | POA: Diagnosis present

## 2015-12-20 DIAGNOSIS — D509 Iron deficiency anemia, unspecified: Secondary | ICD-10-CM | POA: Diagnosis present

## 2015-12-20 DIAGNOSIS — I214 Non-ST elevation (NSTEMI) myocardial infarction: Secondary | ICD-10-CM | POA: Diagnosis not present

## 2015-12-20 DIAGNOSIS — R079 Chest pain, unspecified: Secondary | ICD-10-CM | POA: Diagnosis not present

## 2015-12-20 DIAGNOSIS — Z888 Allergy status to other drugs, medicaments and biological substances status: Secondary | ICD-10-CM | POA: Diagnosis not present

## 2015-12-20 DIAGNOSIS — R0789 Other chest pain: Principal | ICD-10-CM

## 2015-12-20 DIAGNOSIS — E669 Obesity, unspecified: Secondary | ICD-10-CM | POA: Diagnosis present

## 2015-12-20 DIAGNOSIS — I119 Hypertensive heart disease without heart failure: Secondary | ICD-10-CM | POA: Diagnosis not present

## 2015-12-20 DIAGNOSIS — I110 Hypertensive heart disease with heart failure: Secondary | ICD-10-CM | POA: Diagnosis present

## 2015-12-20 DIAGNOSIS — G4733 Obstructive sleep apnea (adult) (pediatric): Secondary | ICD-10-CM | POA: Diagnosis present

## 2015-12-20 DIAGNOSIS — I10 Essential (primary) hypertension: Secondary | ICD-10-CM | POA: Diagnosis not present

## 2015-12-20 DIAGNOSIS — E119 Type 2 diabetes mellitus without complications: Secondary | ICD-10-CM

## 2015-12-20 DIAGNOSIS — Z79899 Other long term (current) drug therapy: Secondary | ICD-10-CM | POA: Diagnosis not present

## 2015-12-20 DIAGNOSIS — E785 Hyperlipidemia, unspecified: Secondary | ICD-10-CM | POA: Diagnosis present

## 2015-12-20 DIAGNOSIS — J810 Acute pulmonary edema: Secondary | ICD-10-CM | POA: Diagnosis not present

## 2015-12-20 DIAGNOSIS — R072 Precordial pain: Secondary | ICD-10-CM | POA: Diagnosis not present

## 2015-12-20 DIAGNOSIS — Z833 Family history of diabetes mellitus: Secondary | ICD-10-CM | POA: Diagnosis not present

## 2015-12-20 DIAGNOSIS — G4730 Sleep apnea, unspecified: Secondary | ICD-10-CM | POA: Diagnosis not present

## 2015-12-20 DIAGNOSIS — Z7982 Long term (current) use of aspirin: Secondary | ICD-10-CM | POA: Diagnosis not present

## 2015-12-20 DIAGNOSIS — Z8249 Family history of ischemic heart disease and other diseases of the circulatory system: Secondary | ICD-10-CM | POA: Diagnosis not present

## 2015-12-20 DIAGNOSIS — Z7984 Long term (current) use of oral hypoglycemic drugs: Secondary | ICD-10-CM | POA: Diagnosis not present

## 2015-12-20 DIAGNOSIS — F418 Other specified anxiety disorders: Secondary | ICD-10-CM | POA: Diagnosis present

## 2015-12-20 DIAGNOSIS — Z885 Allergy status to narcotic agent status: Secondary | ICD-10-CM | POA: Diagnosis not present

## 2015-12-20 LAB — BASIC METABOLIC PANEL
ANION GAP: 10 (ref 5–15)
BUN: 12 mg/dL (ref 6–20)
CHLORIDE: 98 mmol/L — AB (ref 101–111)
CO2: 28 mmol/L (ref 22–32)
Calcium: 9 mg/dL (ref 8.9–10.3)
Creatinine, Ser: 0.93 mg/dL (ref 0.44–1.00)
GFR calc non Af Amer: >60 mL/min (ref >60)
GLUCOSE: 171 mg/dL — AB (ref 65–99)
POTASSIUM: 3.1 mmol/L — AB (ref 3.5–5.1)
Sodium: 136 mmol/L (ref 135–145)

## 2015-12-20 LAB — CBC WITH DIFFERENTIAL/PLATELET
BASOS ABS: 0 10*3/uL (ref 0.0–0.1)
BASOS PCT: 0 %
Eosinophils Absolute: 0.1 10*3/uL (ref 0.0–0.7)
Eosinophils Relative: 1 %
HEMATOCRIT: 32.5 % — AB (ref 36.0–46.0)
HEMOGLOBIN: 9.9 g/dL — AB (ref 12.0–15.0)
LYMPHS PCT: 30 %
Lymphs Abs: 2.9 10*3/uL (ref 0.7–4.0)
MCH: 21.7 pg — ABNORMAL LOW (ref 26.0–34.0)
MCHC: 30.5 g/dL (ref 30.0–36.0)
MCV: 71.3 fL — AB (ref 78.0–100.0)
MONO ABS: 0.6 10*3/uL (ref 0.1–1.0)
Monocytes Relative: 6 %
NEUTROS ABS: 5.9 10*3/uL (ref 1.7–7.7)
NEUTROS PCT: 62 %
Platelets: 254 10*3/uL (ref 150–400)
RBC: 4.56 MIL/uL (ref 3.87–5.11)
RDW: 16.2 % — AB (ref 11.5–15.5)
WBC: 9.5 10*3/uL (ref 4.0–10.5)

## 2015-12-20 LAB — TROPONIN I
TROPONIN I: 0.03 ng/mL — AB (ref <0.03)
TROPONIN I: 0.03 ng/mL — AB (ref <0.03)
Troponin I: 0.03 ng/mL (ref <0.03)

## 2015-12-20 LAB — GLUCOSE, CAPILLARY
GLUCOSE-CAPILLARY: 243 mg/dL — AB (ref 65–99)
Glucose-Capillary: 179 mg/dL — ABNORMAL HIGH (ref 65–99)
Glucose-Capillary: 230 mg/dL — ABNORMAL HIGH (ref 65–99)
Glucose-Capillary: 185 mg/dL — ABNORMAL HIGH (ref 65–99)

## 2015-12-20 LAB — ECHOCARDIOGRAM LIMITED
Height: 66 in
Weight: 3721.6 oz

## 2015-12-20 LAB — HEPARIN LEVEL (UNFRACTIONATED): HEPARIN UNFRACTIONATED: 0.13 [IU]/mL — AB (ref 0.30–0.70)

## 2015-12-20 LAB — POTASSIUM: POTASSIUM: 4.1 mmol/L (ref 3.5–5.1)

## 2015-12-20 LAB — IRON AND TIBC
IRON: 65 ug/dL (ref 28–170)
SATURATION RATIOS: 18 % (ref 10.4–31.8)
TIBC: 367 ug/dL (ref 250–450)
UIBC: 302 ug/dL

## 2015-12-20 LAB — FERRITIN: FERRITIN: 19 ng/mL (ref 11–307)

## 2015-12-20 LAB — D-DIMER, QUANTITATIVE: D-Dimer, Quant: <0.27 ug/mL-FEU (ref 0.00–0.50)

## 2015-12-20 MED ORDER — CARVEDILOL 12.5 MG PO TABS
12.5000 mg | ORAL_TABLET | Freq: Two times a day (BID) | ORAL | Status: DC
  Administered 2015-12-20 – 2015-12-22 (×6): 12.5 mg via ORAL
  Filled 2015-12-20 (×6): qty 1

## 2015-12-20 MED ORDER — NITROGLYCERIN 0.4 MG SL SUBL: 0.4000 mg | SUBLINGUAL_TABLET | SUBLINGUAL | Status: DC | PRN

## 2015-12-20 MED ORDER — ACETAMINOPHEN 325 MG PO TABS
650.0000 mg | ORAL_TABLET | ORAL | Status: DC | PRN
  Administered 2015-12-20 – 2015-12-22 (×5): 650 mg via ORAL
  Filled 2015-12-20 (×6): qty 2

## 2015-12-20 MED ORDER — LINAGLIPTIN 5 MG PO TABS
5.0000 mg | ORAL_TABLET | Freq: Every day | ORAL | Status: DC
  Administered 2015-12-20 – 2015-12-21 (×2): 5 mg via ORAL
  Filled 2015-12-20 (×2): qty 1

## 2015-12-20 MED ORDER — ATORVASTATIN CALCIUM 80 MG PO TABS
80.0000 mg | ORAL_TABLET | Freq: Every day | ORAL | Status: DC
  Administered 2015-12-21 – 2015-12-22 (×2): 80 mg via ORAL
  Filled 2015-12-20 (×4): qty 1

## 2015-12-20 MED ORDER — PREGABALIN 50 MG PO CAPS
75.0000 mg | ORAL_CAPSULE | Freq: Every day | ORAL | Status: DC
  Administered 2015-12-20 – 2015-12-22 (×3): 75 mg via ORAL
  Filled 2015-12-20 (×3): qty 1

## 2015-12-20 MED ORDER — INSULIN ASPART 100 UNIT/ML ~~LOC~~ SOLN
0.0000 [IU] | Freq: Every day | SUBCUTANEOUS | Status: DC
  Administered 2015-12-21: 2 [IU] via SUBCUTANEOUS

## 2015-12-20 MED ORDER — SACUBITRIL-VALSARTAN 49-51 MG PO TABS
1.0000 | ORAL_TABLET | Freq: Two times a day (BID) | ORAL | Status: DC
  Administered 2015-12-20 – 2015-12-21 (×4): 1 via ORAL
  Filled 2015-12-20 (×6): qty 1

## 2015-12-20 MED ORDER — ONDANSETRON HCL 4 MG/2ML IJ SOLN
4.0000 mg | Freq: Four times a day (QID) | INTRAMUSCULAR | Status: DC | PRN
  Administered 2015-12-20: 4 mg via INTRAVENOUS
  Filled 2015-12-20: qty 2

## 2015-12-20 MED ORDER — TORSEMIDE 20 MG PO TABS
20.0000 mg | ORAL_TABLET | Freq: Every day | ORAL | Status: DC
  Administered 2015-12-20 – 2015-12-22 (×3): 20 mg via ORAL
  Filled 2015-12-20 (×3): qty 1

## 2015-12-20 MED ORDER — ASPIRIN EC 81 MG PO TBEC: 81.0000 mg | DELAYED_RELEASE_TABLET | Freq: Every day | ORAL | Status: DC

## 2015-12-20 MED ORDER — FAMOTIDINE 20 MG PO TABS
20.0000 mg | ORAL_TABLET | Freq: Every day | ORAL | Status: DC
Start: 2015-12-20 — End: 2015-12-22
  Administered 2015-12-20 – 2015-12-22 (×3): 20 mg via ORAL
  Filled 2015-12-20 (×3): qty 1

## 2015-12-20 MED ORDER — HEPARIN BOLUS VIA INFUSION
2000.0000 [IU] | Freq: Once | INTRAVENOUS | Status: AC
  Administered 2015-12-20: 2000 [IU] via INTRAVENOUS
  Filled 2015-12-20: qty 2000

## 2015-12-20 MED ORDER — ISOSORB DINITRATE-HYDRALAZINE 20-37.5 MG PO TABS
1.0000 | ORAL_TABLET | Freq: Three times a day (TID) | ORAL | Status: DC
  Administered 2015-12-20 – 2015-12-22 (×8): 1 via ORAL
  Filled 2015-12-20 (×8): qty 1

## 2015-12-20 MED ORDER — AMLODIPINE BESYLATE 10 MG PO TABS
10.0000 mg | ORAL_TABLET | Freq: Every day | ORAL | Status: DC
  Administered 2015-12-20 – 2015-12-22 (×3): 10 mg via ORAL
  Filled 2015-12-20 (×3): qty 1

## 2015-12-20 MED ORDER — INSULIN ASPART 100 UNIT/ML ~~LOC~~ SOLN
0.0000 [IU] | Freq: Three times a day (TID) | SUBCUTANEOUS | Status: DC
  Administered 2015-12-20: 7 [IU] via SUBCUTANEOUS
  Administered 2015-12-20: 4 [IU] via SUBCUTANEOUS
  Administered 2015-12-20 – 2015-12-21 (×2): 7 [IU] via SUBCUTANEOUS
  Administered 2015-12-21 – 2015-12-22 (×3): 4 [IU] via SUBCUTANEOUS
  Administered 2015-12-22: 7 [IU] via SUBCUTANEOUS
  Administered 2015-12-22: 15 [IU] via SUBCUTANEOUS

## 2015-12-20 MED ORDER — POTASSIUM CHLORIDE CRYS ER 20 MEQ PO TBCR
40.0000 meq | EXTENDED_RELEASE_TABLET | Freq: Every day | ORAL | Status: DC
  Administered 2015-12-20 – 2015-12-22 (×3): 40 meq via ORAL
  Filled 2015-12-20 (×3): qty 2

## 2015-12-20 MED ORDER — ASPIRIN EC 81 MG PO TBEC
81.0000 mg | DELAYED_RELEASE_TABLET | Freq: Every day | ORAL | Status: DC
  Administered 2015-12-20 – 2015-12-21 (×2): 81 mg via ORAL
  Filled 2015-12-20 (×2): qty 1

## 2015-12-20 MED ORDER — CITALOPRAM HYDROBROMIDE 20 MG PO TABS: 20.0000 mg | ORAL_TABLET | Freq: Every day | ORAL | Status: DC | PRN

## 2015-12-20 MED ORDER — HEPARIN (PORCINE) IN NACL 100-0.45 UNIT/ML-% IJ SOLN
1900.0000 [IU]/h | INTRAMUSCULAR | Status: DC
  Administered 2015-12-20: 1100 [IU]/h via INTRAVENOUS
  Administered 2015-12-21 – 2015-12-22 (×2): 1900 [IU]/h via INTRAVENOUS
  Filled 2015-12-20 (×4): qty 250

## 2015-12-20 MED ORDER — HEPARIN BOLUS VIA INFUSION
4000.0000 [IU] | Freq: Once | INTRAVENOUS | Status: AC
  Administered 2015-12-20: 4000 [IU] via INTRAVENOUS
  Filled 2015-12-20: qty 4000

## 2015-12-20 NOTE — ED Notes (Signed)
Heparin 4000unit bolus given and gtt started at 1100units/hr

## 2015-12-20 NOTE — Progress Notes (Signed)
ANTICOAGULATION CONSULT NOTE - Follow Up Consult  Pharmacy Consult for heparin Indication: chest pain/ACS  Allergies  Allergen Reactions  . Diclofenac     Edema  . Tramadol Nausea And Vomiting  . Vicodin [Hydrocodone-Acetaminophen] Nausea Only    Patient Measurements: Height: 5\' 6"  (167.6 cm) Weight: 232 lb 9.6 oz (105.5 kg) IBW/kg (Calculated) : 59.3 Heparin Dosing Weight: 84.3  Vital Signs: Temp: 97.4 F (36.3 C) (08/05 1522) Temp Source: Oral (08/05 1522) BP: 100/56 (08/05 1522) Pulse Rate: 82 (08/05 1522)  Labs:  Recent Labs  12/19/15 2124 12/19/15 2131  12/20/15 0929 12/20/15 1411 12/20/15 1750  HGB  --  9.9*  --  9.9*  --   --   HCT  --  32.1*  --  32.5*  --   --   PLT  --  253  --  254  --   --   APTT 26  --   --   --   --   --   LABPROT 13.0  --   --   --   --   --   INR 0.98  --   --   --   --   --   HEPARINUNFRC  --   --   --  <0.10*  --  0.13*  CREATININE  --  0.93  --  0.93  --   --   TROPONINI  --  0.05*  < > 0.03* 0.03* <0.03  < > = values in this interval not displayed.  Estimated Creatinine Clearance: 98.8 mL/min (by C-G formula based on SCr of 0.93 mg/dL).   Assessment: 40 yo female presenting from [facility] for chest pain, plan to cath Monday per cardiology.   HL low at 0.13  Goal of Therapy:  Heparin level 0.3-0.7 units/ml Monitor platelets by anticoagulation protocol: Yes   Plan:  Rebolus heparin at 2000 units x1 Increase heparin to 1650 units/hr HL at 0100 Monitor h/h, pltc, s/sx bleeding  Levester Fresh, PharmD, BCPS, [facility] Clinical Pharmacist Pager [phone] 12/20/2015 7:13 PM

## 2015-12-20 NOTE — ED Notes (Signed)
Report given to Ar[place]a Jansky all questions answered. ETA 30 mins

## 2015-12-20 NOTE — Progress Notes (Signed)
ANTICOAGULATION CONSULT NOTE - Follow Up Consult  Pharmacy Consult for heparin Indication: chest pain/ACS  Allergies  Allergen Reactions  . Diclofenac     Edema  . Tramadol Nausea And Vomiting  . Vicodin [Hydrocodone-Acetaminophen] Nausea Only    Patient Measurements: Height: 5\' 6"  (167.6 cm) Weight: 232 lb 9.6 oz (105.5 kg) IBW/kg (Calculated) : 59.3 Heparin Dosing Weight: 84.3  Vital Signs: Temp: 98.2 F (36.8 C) (08/05 0534) Temp Source: Oral (08/05 0534) BP: 133/87 (08/05 0534) Pulse Rate: 79 (08/05 0534)  Labs:  Recent Labs  12/19/15 2124 12/19/15 2131 12/20/15 0358 12/20/15 0929  HGB  --  9.9*  --  9.9*  HCT  --  32.1*  --  32.5*  PLT  --  253  --  254  APTT 26  --   --   --   LABPROT 13.0  --   --   --   INR 0.98  --   --   --   HEPARINUNFRC  --   --   --  <0.10*  CREATININE  --  0.93  --  0.93  TROPONINI  --  0.05* 0.03* 0.03*    Estimated Creatinine Clearance: 98.8 mL/min (by C-G formula based on SCr of 0.93 mg/dL).   Assessment: 40 yo female presenting from [facility] for chest pain, plan to cath Monday per cardiology. Originally bolused with heparin at 4000 units, then 1100 units/hr hung at 0100 8/5.  8hr HL <0.1 Nurse confirms line is patent and heparin has been running. Hgb 9.9, Pltc wnl stable, no bleeding noted.  Goal of Therapy:  Heparin level 0.3-0.7 units/ml Monitor platelets by anticoagulation protocol: Yes   Plan:  Rebolus heparin at 4000 units x1 Increase heparin to 1400 units/hr HL at 1800 today Monitor h/h, pltc, s/sx bleeding  Carlean Jews, Pharm.D. PGY1 Pharmacy Resident 8/5/201711:40 AM Pager [phone]

## 2015-12-20 NOTE — H&P (Signed)
History & Physical    Patient ID: Chelsey Santiago MRN: [id number], DOB/AGE: 1975-06-07   Admit date: 12/19/2015   Primary Physician: Elyn Peers, MD Primary Cardiologist: Carlyle Dolly, MD   History of Present Illness    Chelsey Santiago is a 40 y.o. female with past medical history of severe systolic heart failure now reversed, hypertension, hyperlipidemia, non-insulin dependent diabetes, obesity, and obstructive sleep apnea is here for chest pain evaluation.    Patient reports at around 8 pm yesterday while walking to the restroom, she developed an abrupt onset of chest pain.  The chest pain is located in the center of her chest and radiated under her left breast with some pain in her left arm.  The pain is described as squeezing, initially 10/10 in intensity, and lasting around 25 minutes.  She did take 3 nitroglycerin tablets at home without relief before calling emergency medical services.  Her chest pain did get better with a fourth nitroglycerin given by emergency medical services.  At the time of her chest pain, she states that she was sweating and felt nauseous.  She denies feeling short of breath, palpitations, lightheadedness, fainting, or feeling as she was going to faint.  She does report some mild chest discomfort now.  Patient reports having chest pain prior but this episode was different.  Patient does not recall ever having a stress test or cardiac catheterization prior.    Past Medical History    Past Medical History:  Diagnosis Date  . Anemia    H&H of 10.6/33 and 07/2008 and 11.9/35 and 09/2010  . Anxiety   . CHF (congestive heart failure) ([place])   . Depression with anxiety   . Diabetes mellitus without complication ([place])   . Enlarged heart   . Fasting hyperglycemia   . Hypertension    Lab: Normal BMet except glucose of 118 in 09/2010  . Hypertensive heart disease 2009   Pulmonary edema postpartum; mild to moderate mitral regurgitation when hospitalized for  CHF in 2009; Echocardiogram in 12/2009-no MR and normal EF; normal CXR in 09/2010  . Migraine headache   . Miscarriage 03/19/2013  . Obesity 04/16/2009  . Osteoarthritis, knee 03/29/2011  . Preeclampsia   . Pregnant   . Pulmonary edema   . Sleep apnea   . Threatened abortion in early pregnancy 03/15/2013    Past Surgical History:  Procedure Laterality Date  . BREAST REDUCTION SURGERY  2002  . CESAREAN SECTION N/A 04/09/2014   Procedure: CESAREAN SECTION;  Surgeon: Mora Bellman, MD;  Location: [facility];  Service: Obstetrics;  Laterality: N/A;  . CHOLECYSTECTOMY       Allergies  Allergies  Allergen Reactions  . Diclofenac     Edema  . Tramadol Nausea And Vomiting  . Vicodin [Hydrocodone-Acetaminophen] Nausea Only     Home Medications    Prior to Admission medications   Medication Sig Start Date End Date Taking? Authorizing Provider  amLODipine (NORVASC) 5 MG tablet Take 2 tablets (10 mg total) by mouth daily. 02/24/15  Yes Lendon Colonel, NP  aspirin EC 81 MG tablet Take 81 mg by mouth daily.   Yes Historical Provider, MD  carvedilol (COREG) 12.5 MG tablet TAKE 3 TABLETS BY MOUTH TWICE DAILY WITH A MEAL. 08/21/15  Yes Arnoldo Lenis, MD  citalopram (CELEXA) 20 MG tablet Take 20 mg by mouth daily as needed (anxiety).    Yes Historical Provider, MD  isosorbide-hydrALAZINE (BIDIL) 20-37.5 MG tablet Take 1 tablet by  mouth 3 (three) times daily.   Yes Historical Provider, MD  metFORMIN (GLUCOPHAGE) 500 MG tablet Take 500 mg by mouth 2 (two) times daily with a meal.   Yes Historical Provider, MD  nitroGLYCERIN (NITROSTAT) 0.4 MG SL tablet Place 1 tablet (0.4 mg total) under the tongue every 5 (five) minutes as needed for chest pain. 09/05/15  Yes Kathie Dike, MD  ondansetron (ZOFRAN-ODT) 8 MG disintegrating tablet Take 1 tablet (8 mg total) by mouth every 8 (eight) hours as needed for nausea or vomiting. 10/07/15  Yes Fraser Din Focht, PA  potassium chloride SA (K-DUR,KLOR-CON) 20  MEQ tablet Take 2 tablets (40 mEq total) by mouth daily. 03/27/15  Yes Lendon Colonel, NP  pregabalin (LYRICA) 75 MG capsule Take 75 mg by mouth daily.   Yes Historical Provider, MD  ranitidine (ZANTAC) 150 MG tablet Take 150 mg by mouth daily as needed for heartburn.   Yes Historical Provider, MD  sacubitril-valsartan (ENTRESTO) 49-51 MG Take 1 tablet by mouth 2 (two) times daily. 09/05/15  Yes Kathie Dike, MD  sitaGLIPtin (JANUVIA) 100 MG tablet Take 100 mg by mouth daily.   Yes Historical Provider, MD  spironolactone (ALDACTONE) 25 MG tablet Take 1 tablet (25 mg total) by mouth daily. 02/24/15  Yes Lendon Colonel, NP  torsemide (DEMADEX) 20 MG tablet Take 1 tablet (20 mg total) by mouth daily. 02/24/15  Yes Lendon Colonel, NP    Family History    Family History  Problem Relation Age of Onset  . Diabetes Mother   . Heart disease Mother   . Hyperlipidemia Paternal Grandfather   . Hypertension Paternal Grandfather   . Heart disease Father   . Heart disease Maternal Grandmother   . Hypertension Maternal Uncle   . Heart attack Brother   . Sudden death Neg Hx     Social History    Social History   Social History  . Marital status: Single    Spouse name: N/A  . Number of children: N/A  . Years of education: N/A   Occupational History  . unemployed Unemployed   Social History Main Topics  . Smoking status: Never Smoker  . Smokeless tobacco: Never Used  . Alcohol use No     Comment: occ  . Drug use: No  . Sexual activity: Not Currently    Birth control/ protection: None   Other Topics Concern  . Not on file   Social History Narrative   Lives in [place]   Engaged/boyfriend (father to youngest chid)   4 children: daughter (27 as of 2013), sons (15, 70, 84 as of 2013)   Religion: christian     Review of Systems   All other systems reviewed and are otherwise negative except as noted above.  Physical Exam    Blood pressure 133/87, pulse 79, temperature  98.2 F (36.8 C), temperature source Oral, resp. rate 16, height 5\' 6"  (1.676 m), weight 105.5 kg (232 lb 9.6 oz), last menstrual period 12/11/2015, SpO2 96 %, unknown if currently breastfeeding.  General: Well developed, well nourished,female in no acute distress. Head: Normocephalic, atraumatic, sclera non-icteric, no xanthomas, nares are without discharge. Dentition:  Neck: No carotid bruits. JVD not elevated.  Lungs: Respirations regular and unlabored, without wheezes or rales.  Heart: Regular rate and rhythm. No S3 or S4.  No murmur, no rubs, or gallops appreciated. Abdomen: Soft, non-tender, non-distended with normoactive bowel sounds. No hepatomegaly. No rebound/guarding. No obvious abdominal masses. Msk:  Strength and  tone appear normal for age. No joint deformities or effusions. Extremities: No clubbing or cyanosis. No edema.  Distal pedal pulses are 2+ bilaterally. Neuro: Alert and oriented X 3. Moves all extremities spontaneously. No focal deficits noted. Psych:  Responds to questions appropriately with a normal affect. Skin: No rashes or lesions noted  Labs    Troponin (Point of Care Test) No results for input(s): TROPIPOC in the last 72 hours.  Recent Labs  12/19/15 2131 12/20/15 0358  TROPONINI 0.05* 0.03*   Lab Results  Component Value Date   WBC 13.4 (H) 12/19/2015   HGB 9.9 (L) 12/19/2015   HCT 32.1 (L) 12/19/2015   MCV 70.5 (L) 12/19/2015   PLT 253 12/19/2015    Recent Labs Lab 12/19/15 2131  NA 133*  K 2.9*  CL 97*  CO2 25  BUN 10  CREATININE 0.93  CALCIUM 8.7*  PROT 7.8  BILITOT 0.6  ALKPHOS 84  ALT 20  AST 23  GLUCOSE 269*   Lab Results  Component Value Date   CHOL 154 11/20/2004   HDL 38 11/20/2004   LDLCALC 71 11/20/2004   TRIG 225 11/20/2004   Lab Results  Component Value Date   DDIMER  07/30/2008    0.24        AT THE INHOUSE ESTABLISHED CUTOFF VALUE OF 0.48 ug/mL FEU, THIS ASSAY HAS BEEN DOCUMENTED IN THE LITERATURE TO HAVE A  SENSITIVITY AND NEGATIVE PREDICTIVE VALUE OF AT LEAST 98 TO 99%.  THE TEST RESULT SHOULD BE CORRELATED WITH AN ASSESSMENT OF THE CLINICAL PROBABILITY OF DVT / VTE.     B Natriuretic Peptide  Date/Time Value Ref Range Status  09/04/2015 11:20 PM 184.0 (H) 0.0 - 100.0 pg/mL Final   Pro B Natriuretic peptide (BNP)  Date/Time Value Ref Range Status  04/22/2014 04:24 PM 2,380.0 (H) 0 - 125 pg/mL Final  04/08/2014 03:45 PM 1,854.0 (H) 0 - 125 pg/mL Final    Comment:    Performed at [facility]  12/19/15 2124  INR 0.98      Radiology Studies    Ct Head Wo Contrast  Result Date: 12/08/2015 CLINICAL DATA:  Initial evaluation for acute headache, elevated blood pressure. EXAM: CT HEAD WITHOUT CONTRAST TECHNIQUE: Contiguous axial images were obtained from the base of the skull through the vertex without intravenous contrast. COMPARISON:  None. FINDINGS: There is no acute intracranial hemorrhage or infarct. No mass lesion or midline shift. Gray-white matter differentiation is well maintained. Ventricles are normal in size without evidence of hydrocephalus. CSF containing spaces are within normal limits. No extra-axial fluid collection. Mild chronic microvascular ischemic changes. The calvarium is intact. Orbital soft tissues are within normal limits. The paranasal sinuses and mastoid air cells are well pneumatized and free of fluid. Scalp soft tissues are unremarkable. IMPRESSION: 1. No acute intracranial process. 2. Mild chronic small vessel ischemic disease. Electronically Signed   By: Jeannine Boga M.D.   On: 12/08/2015 05:48  Dg Chest Portable 1 View  Result Date: 12/19/2015 CLINICAL DATA:  Mid to left lower chest pain, onset today. EXAM: PORTABLE CHEST 1 VIEW COMPARISON:  09/05/2015 FINDINGS: Mild cardiomegaly. No pulmonary edema, confluent airspace disease, pleural effusion or pneumothorax. Osseous structures appear normal per IMPRESSION: Cardiomegaly.  No  localizing pulmonary process. Electronically Signed   By: Jeb Levering M.D.   On: 12/19/2015 21:31    EKG & Cardiac Imaging    EKG: 12/19/15 (21:34):  NSR, poor r-wave progression,  non-specific t-wave inversions in the anterolateral leads (new from prior).     ECHOCARDIOGRAM:  Assessment & Plan    Principal Problem:   Chest pain Active Problems:   Obesity   Sleep apnea   Hypertension   Microcytic anemia   Essential hypertension   DM type 2 (diabetes mellitus, type 2) (HCC)   NSTEMI (non-ST elevated myocardial infarction) [facility])  Patient with classical anginal chest pain in the setting of an equivocal troponin elevation and new t-wave changes on her ECG.  Must consider NSTEMI-ACS as potential etiology.  Other potential etiologies include coronary vasospasms.  Although low-risk for a pulmonary embolism, it still must be included in the differential.    # Chest pain: Differential as noted above, with potential NSTEMI-ACS being high on differential.  She is currently having mild chest pain but appears hemodynamically stable.  Repeat ECG is currently pending and troponin level is trending downward. - Repeat ECG and trend troponin level. - Will check d-dimer to rule out PE - Will continue heparin drip and aspirin daily for now. - Will discuss with day team potential cardiac catheterization, - Check limited echocardiogram.  # Non-insulin dependent diabetes:   Patient with a known history of non-insulin dependent diabetes.  Blood glucose is elevated. - Stop metformin. - Continue Januvia. - Start sliding scale insulin.  # Hypertension: Patient with known essential hypertension that likely caused prior heart failure.  No renal failure at this time.  Blood pressure is at goal at this time.   - Hold spironolactone at this time, due to blood pressure is normal and on excessive amount of other blood pressure medications.  # Anemia: Patient with chronic microcytic anemia of unknown  etiology in the setting of being pre-menopausal.  She is without any symptoms related to anemia. - Check iron panel and ferritin.    # Hyperlipidemia Patient with reported history of hyperlipidemia and cardiovascular risk factors. - Start Lipitor 80 mg/daily   # Tobacco abuse: Known history of tobacco abuse. - Smoking cessation counseling.    Signed, Jon Billings, MD 12/20/2015, 7:03 AM

## 2015-12-20 NOTE — Progress Notes (Addendum)
Spoke with patient. Still having chest pain. Troponins declining. Will plan for cath Monday.

## 2015-12-20 NOTE — Progress Notes (Signed)
*  PRELIMINARY RESULTS* Echocardiogram 2D Echocardiogram has been performed.  Chelsey Santiago 12/20/2015, 2:49 PM

## 2015-12-20 NOTE — ED Notes (Signed)
Report given to [place] at [facility] All questions answered. Will call her back when carelink leaves with patient

## 2015-12-21 DIAGNOSIS — I519 Heart disease, unspecified: Secondary | ICD-10-CM

## 2015-12-21 DIAGNOSIS — G4730 Sleep apnea, unspecified: Secondary | ICD-10-CM

## 2015-12-21 DIAGNOSIS — D509 Iron deficiency anemia, unspecified: Secondary | ICD-10-CM

## 2015-12-21 DIAGNOSIS — I5042 Chronic combined systolic (congestive) and diastolic (congestive) heart failure: Secondary | ICD-10-CM

## 2015-12-21 DIAGNOSIS — R9431 Abnormal electrocardiogram [ECG] [EKG]: Secondary | ICD-10-CM

## 2015-12-21 DIAGNOSIS — E785 Hyperlipidemia, unspecified: Secondary | ICD-10-CM

## 2015-12-21 LAB — GLUCOSE, CAPILLARY
GLUCOSE-CAPILLARY: 223 mg/dL — AB (ref 65–99)
Glucose-Capillary: 191 mg/dL — ABNORMAL HIGH (ref 65–99)
Glucose-Capillary: 170 mg/dL — ABNORMAL HIGH (ref 65–99)
Glucose-Capillary: 244 mg/dL — ABNORMAL HIGH (ref 65–99)

## 2015-12-21 LAB — HEPARIN LEVEL (UNFRACTIONATED)
HEPARIN UNFRACTIONATED: 0.38 [IU]/mL (ref 0.30–0.70)
Heparin Unfractionated: 0.24 IU/mL — ABNORMAL LOW (ref 0.30–0.70)
Heparin Unfractionated: 0.43 IU/mL (ref 0.30–0.70)

## 2015-12-21 LAB — CBC
HEMATOCRIT: 30 % — AB (ref 36.0–46.0)
HEMOGLOBIN: 9.1 g/dL — AB (ref 12.0–15.0)
MCH: 21.6 pg — AB (ref 26.0–34.0)
MCHC: 30.3 g/dL (ref 30.0–36.0)
MCV: 71.1 fL — ABNORMAL LOW (ref 78.0–100.0)
Platelets: 254 10*3/uL (ref 150–400)
RBC: 4.22 MIL/uL (ref 3.87–5.11)
RDW: 16 % — ABNORMAL HIGH (ref 11.5–15.5)
WBC: 8.3 10*3/uL (ref 4.0–10.5)

## 2015-12-21 NOTE — Progress Notes (Signed)
ANTICOAGULATION CONSULT NOTE - Follow Up Consult  Pharmacy Consult for Heparin  Indication: chest pain/ACS, cath planned 8/7  Allergies  Allergen Reactions  . Diclofenac     Edema  . Tramadol Nausea And Vomiting  . Vicodin [Hydrocodone-Acetaminophen] Nausea Only   Patient Measurements: Height: 5\' 6"  (167.6 cm) Weight: 236 lb 11.2 oz (107.4 kg) IBW/kg (Calculated) : 59.3  Vital Signs: Temp: 98.4 F (36.9 C) (08/06 0453) BP: 134/90 (08/06 0908) Pulse Rate: 85 (08/06 0453)  Labs:  Recent Labs  12/19/15 2124  12/19/15 2131  12/20/15 0929 12/20/15 1411 12/20/15 1750 12/21/15 0119 12/21/15 0358 12/21/15 0924  HGB  --   < > 9.9*  --  9.9*  --   --   --  9.1*  --   HCT  --   --  32.1*  --  32.5*  --   --   --  30.0*  --   PLT  --   --  253  --  254  --   --   --  254  --   APTT 26  --   --   --   --   --   --   --   --   --   LABPROT 13.0  --   --   --   --   --   --   --   --   --   INR 0.98  --   --   --   --   --   --   --   --   --   HEPARINUNFRC  --   --   --   < > <0.10*  --  0.13* 0.24*  --  0.43  CREATININE  --   --  0.93  --  0.93  --   --   --   --   --   TROPONINI  --   --  0.05*  < > 0.03* 0.03* <0.03  --   --   --   < > = values in this interval not displayed.  Estimated Creatinine Clearance: 99.6 mL/min (by C-G formula based on SCr of 0.93 mg/dL).  Assessment: Pt on heparin for CP which improved overnight per note, planning for cath Monday. Heparin level is therapeutic today at 0.43 after several boluses and rate increases. Hgb low, stable (baseline ~9-10), PLTC stable WNL, no s/sx bleeding noted.   Goal of Therapy:  Heparin level 0.3-0.7 units/ml Monitor platelets by anticoagulation protocol: Yes   Plan:  - Continue heparin 1900 units/hr -Confirmatory HL ordered for 1500 today - monitor s/sx bleeding, daily HL and CBC  Carlean Jews, Pharm.D. PGY1 Pharmacy Resident 8/6/201711:14 AM Pager [phone]

## 2015-12-21 NOTE — Progress Notes (Signed)
SUBJECTIVE: "Chest pain eased off last night". Has bilateral neck pain/pressure with exertion (walking to bathroom).   ROS: Other than pertinent positives in "Subjective", all others were reviewed and found to be negative.   Intake/Output Summary (Last 24 hours) at 12/21/15 0906 Last data filed at 12/21/15 0500  Gross per 24 hour  Intake              960 ml  Output              400 ml  Net              560 ml    Current Facility-Administered Medications  Medication Dose Route Frequency Provider Last Rate Last Dose  . acetaminophen (TYLENOL) tablet 650 mg  650 mg Oral Q4H PRN Susanne Greenhouse, MD   650 mg at 12/20/15 1447  . amLODipine (NORVASC) tablet 10 mg  10 mg Oral Daily Susanne Greenhouse, MD   10 mg at 12/20/15 0932  . aspirin EC tablet 81 mg  81 mg Oral Daily Susanne Greenhouse, MD   81 mg at 12/20/15 0932  . atorvastatin (LIPITOR) tablet 80 mg  80 mg Oral q1800 Susanne Greenhouse, MD      . carvedilol (COREG) tablet 12.5 mg  12.5 mg Oral BID WC Susanne Greenhouse, MD   12.5 mg at 12/20/15 1816  . citalopram (CELEXA) tablet 20 mg  20 mg Oral Daily PRN Susanne Greenhouse, MD      . famotidine (PEPCID) tablet 20 mg  20 mg Oral Daily Susanne Greenhouse, MD   20 mg at 12/20/15 0932  . heparin ADULT infusion 100 units/mL (25000 units/271mL sodium chloride 0.45%)  1,900 Units/hr Intravenous Continuous Erenest Blank, RPH 19 mL/hr at 12/21/15 0201 1,900 Units/hr at 12/21/15 0201  . insulin aspart (novoLOG) injection 0-20 Units  0-20 Units Subcutaneous TID WC Susanne Greenhouse, MD   7 Units at 12/20/15 1800  . insulin aspart (novoLOG) injection 0-5 Units  0-5 Units Subcutaneous QHS Susanne Greenhouse, MD      . isosorbide-hydrALAZINE (BIDIL) 20-37.5 MG per tablet 1 tablet  1 tablet Oral TID Susanne Greenhouse, MD   1 tablet at 12/20/15 2211  . linagliptin (TRADJENTA) tablet 5 mg  5 mg Oral Daily Susanne Greenhouse, MD   5 mg at 12/20/15 0932  . nitroGLYCERIN (NITROSTAT) SL tablet 0.4  mg  0.4 mg Sublingual Q5 Min x 3 PRN Susanne Greenhouse, MD      . ondansetron [facility]) injection 4 mg  4 mg Intravenous Q6H PRN Susanne Greenhouse, MD   4 mg at 12/20/15 1045  . potassium chloride SA (K-DUR,KLOR-CON) CR tablet 40 mEq  40 mEq Oral Daily Susanne Greenhouse, MD   40 mEq at 12/20/15 0932  . pregabalin (LYRICA) capsule 75 mg  75 mg Oral Daily Susanne Greenhouse, MD   75 mg at 12/20/15 0932  . sacubitril-valsartan (ENTRESTO) 49-51 mg per tablet  1 tablet Oral BID Susanne Greenhouse, MD   1 tablet at 12/20/15 2211  . torsemide (DEMADEX) tablet 20 mg  20 mg Oral Daily Susanne Greenhouse, MD   20 mg at 12/20/15 1044    Vitals:   12/20/15 0534 12/20/15 1522 12/20/15 2100 12/21/15 0453  BP: 133/87 (!) 100/56 (!) 101/53 119/67  Pulse: 79 82 87 85  Resp:   15 18  Temp: 98.2 F (36.8 C) 97.4 F (36.3  C) 97.8 F (36.6 C) 98.4 F (36.9 C)  TempSrc: Oral Oral    SpO2: 96% 98% 98% 97%  Weight: 232 lb 9.6 oz (105.5 kg)   236 lb 11.2 oz (107.4 kg)  Height: 5\' 6"  (1.676 m)       PHYSICAL EXAM General: NAD HEENT: Normal. Neck: No JVD, no thyromegaly.  Lungs: Clear to auscultation bilaterally with normal respiratory effort. CV: Nondisplaced PMI.  Regular rate and rhythm, normal S1/S2, no S3/S4, no murmur.  No pretibial edema.  Abdomen: Soft, nontender, obese.  Neurologic: Alert and oriented x 3.  Psych: Normal affect. Musculoskeletal: Normal range of motion. No gross deformities. Extremities: No clubbing or cyanosis.     LABS: Basic Metabolic Panel:  Recent Labs  12/19/15 2131 12/20/15 0929 12/20/15 1750  NA 133* 136  --   K 2.9* 3.1* 4.1  CL 97* 98*  --   CO2 25 28  --   GLUCOSE 269* 171*  --   BUN 10 12  --   CREATININE 0.93 0.93  --   CALCIUM 8.7* 9.0  --    Liver Function Tests:  Recent Labs  12/19/15 2131  AST 23  ALT 20  ALKPHOS 84  BILITOT 0.6  PROT 7.8  ALBUMIN 3.5   No results for input(s): LIPASE, AMYLASE in the last 72 hours. CBC:  Recent Labs   12/19/15 2131 12/20/15 0929 12/21/15 0358  WBC 13.4* 9.5 8.3  NEUTROABS 10.1* 5.9  --   HGB 9.9* 9.9* 9.1*  HCT 32.1* 32.5* 30.0*  MCV 70.5* 71.3* 71.1*  PLT 253 254 254   Cardiac Enzymes:  Recent Labs  12/20/15 0929 12/20/15 1411 12/20/15 1750  TROPONINI 0.03* 0.03* <0.03   BNP: Invalid input(s): POCBNP D-Dimer:  Recent Labs  12/20/15 0929  DDIMER <0.27   Hemoglobin A1C: No results for input(s): HGBA1C in the last 72 hours. Fasting Lipid Panel: No results for input(s): CHOL, HDL, LDLCALC, TRIG, CHOLHDL, LDLDIRECT in the last 72 hours. Thyroid Function Tests: No results for input(s): TSH, T4TOTAL, T3FREE, THYROIDAB in the last 72 hours.  Invalid input(s): FREET3 Anemia Panel:  Recent Labs  12/20/15 0929  FERRITIN 19  TIBC 367  IRON 65    RADIOLOGY: Ct Head Wo Contrast  Result Date: 12/08/2015 CLINICAL DATA:  Initial evaluation for acute headache, elevated blood pressure. EXAM: CT HEAD WITHOUT CONTRAST TECHNIQUE: Contiguous axial images were obtained from the base of the skull through the vertex without intravenous contrast. COMPARISON:  None. FINDINGS: There is no acute intracranial hemorrhage or infarct. No mass lesion or midline shift. Gray-white matter differentiation is well maintained. Ventricles are normal in size without evidence of hydrocephalus. CSF containing spaces are within normal limits. No extra-axial fluid collection. Mild chronic microvascular ischemic changes. The calvarium is intact. Orbital soft tissues are within normal limits. The paranasal sinuses and mastoid air cells are well pneumatized and free of fluid. Scalp soft tissues are unremarkable. IMPRESSION: 1. No acute intracranial process. 2. Mild chronic small vessel ischemic disease. Electronically Signed   By: Jeannine Boga M.D.   On: 12/08/2015 05:48  Dg Chest Portable 1 View  Result Date: 12/19/2015 CLINICAL DATA:  Mid to left lower chest pain, onset today. EXAM: PORTABLE CHEST 1  VIEW COMPARISON:  09/05/2015 FINDINGS: Mild cardiomegaly. No pulmonary edema, confluent airspace disease, pleural effusion or pneumothorax. Osseous structures appear normal per IMPRESSION: Cardiomegaly.  No localizing pulmonary process. Electronically Signed   By: Jeb Levering M.D.   On: 12/19/2015 21:31  ASSESSMENT AND PLAN: 1. Chest pain with new T wave inversions: Given CV risk factors, symptoms worrisome for ischemic heart disease. Plan for cath tomorrow. Continue ASA, Lipitor, Coreg, heparin.  2. Type II DM: Continue Januvia and RISS.  3. Hyperlipidemia: Continue Lipitor.  4. Anemia: Hgb 9.1.  5. Essential HTN: Controlled. No changes.  6. Cardiomyopathy/chronic systolic heart failure: EF 40-45% (had been 50-55% in 08/2015). On Coreg and Entresto. Euvolemic on torsemide. Plan for cath tomorrow.    Kate Sable, M.D., F.A.C.C.

## 2015-12-21 NOTE — Progress Notes (Signed)
ANTICOAGULATION CONSULT NOTE - Follow Up Consult  Pharmacy Consult for Heparin  Indication: chest pain/ACS, cath planned 8/7  Allergies  Allergen Reactions  . Diclofenac     Edema  . Tramadol Nausea And Vomiting  . Vicodin [Hydrocodone-Acetaminophen] Nausea Only   Patient Measurements: Height: 5\' 6"  (167.6 cm) Weight: 236 lb 11.2 oz (107.4 kg) IBW/kg (Calculated) : 59.3  Vital Signs: Temp: 98.4 F (36.9 C) (08/06 1429) Temp Source: Oral (08/06 1429) BP: 112/64 (08/06 1429) Pulse Rate: 79 (08/06 1429)  Labs:  Recent Labs  12/19/15 2124  12/19/15 2131  12/20/15 0929 12/20/15 1411 12/20/15 1750 12/21/15 0119 12/21/15 0358 12/21/15 0924 12/21/15 1436  HGB  --   < > 9.9*  --  9.9*  --   --   --  9.1*  --   --   HCT  --   --  32.1*  --  32.5*  --   --   --  30.0*  --   --   PLT  --   --  253  --  254  --   --   --  254  --   --   APTT 26  --   --   --   --   --   --   --   --   --   --   LABPROT 13.0  --   --   --   --   --   --   --   --   --   --   INR 0.98  --   --   --   --   --   --   --   --   --   --   HEPARINUNFRC  --   --   --   < > <0.10*  --  0.13* 0.24*  --  0.43 0.38  CREATININE  --   --  0.93  --  0.93  --   --   --   --   --   --   TROPONINI  --   --  0.05*  < > 0.03* 0.03* <0.03  --   --   --   --   < > = values in this interval not displayed.  Estimated Creatinine Clearance: 99.6 mL/min (by C-G formula based on SCr of 0.93 mg/dL).  Assessment: Pt on heparin for CP which improved overnight per note, planning for cath Monday. Heparin level is therapeutic x2 after several boluses and rate increases. Hgb low, stable (baseline ~9-10), PLTC stable WNL, no s/sx bleeding noted.   Goal of Therapy:  Heparin level 0.3-0.7 units/ml Monitor platelets by anticoagulation protocol: Yes   Plan:  - Continue heparin 1900 units/hr - monitor s/sx bleeding, daily HL and CBC  Levester Fresh, PharmD, BCPS, [facility] Clinical Pharmacist Pager [phone] 12/21/2015 3:25  PM

## 2015-12-21 NOTE — Plan of Care (Signed)
Problem: Education: Goal: Knowledge of [facility] Education information/materials will improve Outcome: Progressing Patient aware of plan of care.  Medication education provided to patient on all medications administered thus far this shift.  Patient stated understanding.

## 2015-12-21 NOTE — Progress Notes (Signed)
ANTICOAGULATION CONSULT NOTE - Follow Up Consult  Pharmacy Consult for Heparin  Indication: chest pain/ACS, cath 8/7  Allergies  Allergen Reactions  . Diclofenac     Edema  . Tramadol Nausea And Vomiting  . Vicodin [Hydrocodone-Acetaminophen] Nausea Only   Patient Measurements: Height: 5\' 6"  (167.6 cm) Weight: 232 lb 9.6 oz (105.5 kg) IBW/kg (Calculated) : 59.3  Vital Signs: Temp: 97.8 F (36.6 C) (08/05 2100) Temp Source: Oral (08/05 1522) BP: 101/53 (08/05 2100) Pulse Rate: 87 (08/05 2100)  Labs:  Recent Labs  12/19/15 2124 12/19/15 2131  12/20/15 0929 12/20/15 1411 12/20/15 1750 12/21/15 0119  HGB  --  9.9*  --  9.9*  --   --   --   HCT  --  32.1*  --  32.5*  --   --   --   PLT  --  253  --  254  --   --   --   APTT 26  --   --   --   --   --   --   LABPROT 13.0  --   --   --   --   --   --   INR 0.98  --   --   --   --   --   --   HEPARINUNFRC  --   --   --  <0.10*  --  0.13* 0.24*  CREATININE  --  0.93  --  0.93  --   --   --   TROPONINI  --  0.05*  < > 0.03* 0.03* <0.03  --   < > = values in this interval not displayed.  Estimated Creatinine Clearance: 98.8 mL/min (by C-G formula based on SCr of 0.93 mg/dL).  Assessment: Pt with ongoing CP, planning for cath Monday, HL remains low despite rate increase  Goal of Therapy:  Heparin level 0.3-0.7 units/ml Monitor platelets by anticoagulation protocol: Yes   Plan:  -Increase heparin to 1900 units/hr -1000 HL  Aayansh Codispoti 12/21/2015,1:46 AM

## 2015-12-22 ENCOUNTER — Encounter (HOSPITAL_COMMUNITY): Payer: Self-pay | Admitting: Cardiology

## 2015-12-22 ENCOUNTER — Encounter (HOSPITAL_COMMUNITY)
Admission: EM | Disposition: A | Discharge status: Home / Self Care | Payer: Self-pay | Source: Home / Self Care | Attending: Internal Medicine

## 2015-12-22 DIAGNOSIS — Z0389 Encounter for observation for other suspected diseases and conditions ruled out: Secondary | ICD-10-CM

## 2015-12-22 DIAGNOSIS — I119 Hypertensive heart disease without heart failure: Secondary | ICD-10-CM

## 2015-12-22 DIAGNOSIS — IMO0001 Reserved for inherently not codable concepts without codable children: Secondary | ICD-10-CM

## 2015-12-22 DIAGNOSIS — R072 Precordial pain: Secondary | ICD-10-CM

## 2015-12-22 DIAGNOSIS — I43 Cardiomyopathy in diseases classified elsewhere: Secondary | ICD-10-CM

## 2015-12-22 DIAGNOSIS — R7989 Other specified abnormal findings of blood chemistry: Secondary | ICD-10-CM | POA: Diagnosis present

## 2015-12-22 DIAGNOSIS — R778 Other specified abnormalities of plasma proteins: Secondary | ICD-10-CM | POA: Diagnosis present

## 2015-12-22 HISTORY — PX: CARDIAC CATHETERIZATION: SHX172

## 2015-12-22 LAB — BASIC METABOLIC PANEL
ANION GAP: 9 (ref 5–15)
BUN: 22 mg/dL — ABNORMAL HIGH (ref 6–20)
CO2: 27 mmol/L (ref 22–32)
Calcium: 9.7 mg/dL (ref 8.9–10.3)
Chloride: 100 mmol/L — ABNORMAL LOW (ref 101–111)
Creatinine, Ser: 0.92 mg/dL (ref 0.44–1.00)
GLUCOSE: 192 mg/dL — AB (ref 65–99)
POTASSIUM: 3.6 mmol/L (ref 3.5–5.1)
Sodium: 136 mmol/L (ref 135–145)

## 2015-12-22 LAB — CBC
HCT: 30.2 % — ABNORMAL LOW (ref 36.0–46.0)
Hemoglobin: 9 g/dL — ABNORMAL LOW (ref 12.0–15.0)
MCH: 21.4 pg — ABNORMAL LOW (ref 26.0–34.0)
MCHC: 29.8 g/dL — ABNORMAL LOW (ref 30.0–36.0)
MCV: 71.7 fL — ABNORMAL LOW (ref 78.0–100.0)
Platelets: 254 10*3/uL (ref 150–400)
RBC: 4.21 MIL/uL (ref 3.87–5.11)
RDW: 16.3 % — ABNORMAL HIGH (ref 11.5–15.5)
WBC: 7.7 10*3/uL (ref 4.0–10.5)

## 2015-12-22 LAB — GLUCOSE, CAPILLARY
GLUCOSE-CAPILLARY: 201 mg/dL — AB (ref 65–99)
GLUCOSE-CAPILLARY: 186 mg/dL — AB (ref 65–99)
Glucose-Capillary: 309 mg/dL — ABNORMAL HIGH (ref 65–99)

## 2015-12-22 LAB — HCG, SERUM, QUALITATIVE: Preg, Serum: NEGATIVE

## 2015-12-22 LAB — PROTIME-INR
INR: 0.91
PROTHROMBIN TIME: 12.2 s (ref 11.4–15.2)

## 2015-12-22 LAB — HEPARIN LEVEL (UNFRACTIONATED): HEPARIN UNFRACTIONATED: 0.41 [IU]/mL (ref 0.30–0.70)

## 2015-12-22 SURGERY — LEFT HEART CATH AND CORONARY ANGIOGRAPHY
Anesthesia: LOCAL

## 2015-12-22 MED ORDER — ACETAMINOPHEN 325 MG PO TABS: 650.0000 mg | ORAL_TABLET | ORAL | Status: DC | PRN

## 2015-12-22 MED ORDER — SODIUM CHLORIDE 0.9 % WEIGHT BASED INFUSION
3.0000 mL/kg/h | INTRAVENOUS | Status: DC
  Administered 2015-12-22: 3 mL/kg/h via INTRAVENOUS

## 2015-12-22 MED ORDER — IOPAMIDOL (ISOVUE-370) INJECTION 76%
INTRAVENOUS | Status: AC
  Filled 2015-12-22: qty 100

## 2015-12-22 MED ORDER — HEPARIN (PORCINE) IN NACL 2-0.9 UNIT/ML-% IJ SOLN
INTRAMUSCULAR | Status: DC | PRN
  Administered 2015-12-22: 10:00:00

## 2015-12-22 MED ORDER — METFORMIN HCL 500 MG PO TABS: 500.0000 mg | ORAL_TABLET | Freq: Two times a day (BID) | ORAL | Status: DC

## 2015-12-22 MED ORDER — SODIUM CHLORIDE 0.9 % IV SOLN: 250.0000 mL | INTRAVENOUS | Status: DC | PRN

## 2015-12-22 MED ORDER — MIDAZOLAM HCL 2 MG/2ML IJ SOLN
INTRAMUSCULAR | Status: DC | PRN
  Administered 2015-12-22: 1 mg via INTRAVENOUS

## 2015-12-22 MED ORDER — HEPARIN SODIUM (PORCINE) 5000 UNIT/ML IJ SOLN: 5000.0000 [IU] | Freq: Three times a day (TID) | INTRAMUSCULAR | Status: DC

## 2015-12-22 MED ORDER — VERAPAMIL HCL 2.5 MG/ML IV SOLN
INTRAVENOUS | Status: DC | PRN
  Administered 2015-12-22: 10 mL via INTRA_ARTERIAL

## 2015-12-22 MED ORDER — SODIUM CHLORIDE 0.9% FLUSH: 3.0000 mL | INTRAVENOUS | Status: DC | PRN

## 2015-12-22 MED ORDER — HEPARIN SODIUM (PORCINE) 1000 UNIT/ML IJ SOLN
INTRAMUSCULAR | Status: DC | PRN
  Administered 2015-12-22: 5000 [IU] via INTRAVENOUS

## 2015-12-22 MED ORDER — SODIUM CHLORIDE 0.9% FLUSH
3.0000 mL | Freq: Two times a day (BID) | INTRAVENOUS | Status: DC
  Administered 2015-12-22: 3 mL via INTRAVENOUS

## 2015-12-22 MED ORDER — HEPARIN (PORCINE) IN NACL 2-0.9 UNIT/ML-% IJ SOLN
INTRAMUSCULAR | Status: AC
  Filled 2015-12-22: qty 1000

## 2015-12-22 MED ORDER — SODIUM CHLORIDE 0.9 % WEIGHT BASED INFUSION
1.0000 mL/kg/h | INTRAVENOUS | Status: DC
Start: 2015-12-22 — End: 2015-12-22

## 2015-12-22 MED ORDER — IOPAMIDOL (ISOVUE-370) INJECTION 76%
INTRAVENOUS | Status: AC
  Filled 2015-12-22: qty 50

## 2015-12-22 MED ORDER — LIDOCAINE HCL (PF) 1 % IJ SOLN
INTRAMUSCULAR | Status: AC
  Filled 2015-12-22: qty 30

## 2015-12-22 MED ORDER — SODIUM CHLORIDE 0.9 % WEIGHT BASED INFUSION
1.0000 mL/kg/h | INTRAVENOUS | Status: AC
  Administered 2015-12-22: 1 mL/kg/h via INTRAVENOUS

## 2015-12-22 MED ORDER — VERAPAMIL HCL 2.5 MG/ML IV SOLN
INTRAVENOUS | Status: AC
  Filled 2015-12-22: qty 2

## 2015-12-22 MED ORDER — ASPIRIN 81 MG PO CHEW
81.0000 mg | CHEWABLE_TABLET | ORAL | Status: AC
  Administered 2015-12-22: 81 mg via ORAL
  Filled 2015-12-22: qty 1

## 2015-12-22 MED ORDER — FENTANYL CITRATE (PF) 100 MCG/2ML IJ SOLN
INTRAMUSCULAR | Status: DC | PRN
  Administered 2015-12-22: 25 ug via INTRAVENOUS

## 2015-12-22 MED ORDER — SODIUM CHLORIDE 0.9% FLUSH: 3.0000 mL | Freq: Two times a day (BID) | INTRAVENOUS | Status: DC

## 2015-12-22 MED ORDER — IOPAMIDOL (ISOVUE-370) INJECTION 76%
INTRAVENOUS | Status: DC | PRN
  Administered 2015-12-22: 110 mL

## 2015-12-22 MED ORDER — HEPARIN SODIUM (PORCINE) 1000 UNIT/ML IJ SOLN
INTRAMUSCULAR | Status: AC
  Filled 2015-12-22: qty 1

## 2015-12-22 MED ORDER — FENTANYL CITRATE (PF) 100 MCG/2ML IJ SOLN
INTRAMUSCULAR | Status: AC
  Filled 2015-12-22: qty 2

## 2015-12-22 MED ORDER — LIDOCAINE HCL (PF) 1 % IJ SOLN
INTRAMUSCULAR | Status: DC | PRN
  Administered 2015-12-22: 2 mL

## 2015-12-22 MED ORDER — MIDAZOLAM HCL 2 MG/2ML IJ SOLN
INTRAMUSCULAR | Status: AC
  Filled 2015-12-22: qty 2

## 2015-12-22 SURGICAL SUPPLY — 11 items

## 2015-12-22 NOTE — H&P (View-Only) (Signed)
SUBJECTIVE: "Chest pain eased off last night". Has bilateral neck pain/pressure with exertion (walking to bathroom).   ROS: Other than pertinent positives in "Subjective", all others were reviewed and found to be negative.   Intake/Output Summary (Last 24 hours) at 12/21/15 0906 Last data filed at 12/21/15 0500  Gross per 24 hour  Intake              960 ml  Output              400 ml  Net              560 ml    Current Facility-Administered Medications  Medication Dose Route Frequency Provider Last Rate Last Dose  . acetaminophen (TYLENOL) tablet 650 mg  650 mg Oral Q4H PRN Susanne Greenhouse, MD   650 mg at 12/20/15 1447  . amLODipine (NORVASC) tablet 10 mg  10 mg Oral Daily Susanne Greenhouse, MD   10 mg at 12/20/15 0932  . aspirin EC tablet 81 mg  81 mg Oral Daily Susanne Greenhouse, MD   81 mg at 12/20/15 0932  . atorvastatin (LIPITOR) tablet 80 mg  80 mg Oral q1800 Susanne Greenhouse, MD      . carvedilol (COREG) tablet 12.5 mg  12.5 mg Oral BID WC Susanne Greenhouse, MD   12.5 mg at 12/20/15 1816  . citalopram (CELEXA) tablet 20 mg  20 mg Oral Daily PRN Susanne Greenhouse, MD      . famotidine (PEPCID) tablet 20 mg  20 mg Oral Daily Susanne Greenhouse, MD   20 mg at 12/20/15 0932  . heparin ADULT infusion 100 units/mL (25000 units/244mL sodium chloride 0.45%)  1,900 Units/hr Intravenous Continuous Erenest Blank, RPH 19 mL/hr at 12/21/15 0201 1,900 Units/hr at 12/21/15 0201  . insulin aspart (novoLOG) injection 0-20 Units  0-20 Units Subcutaneous TID WC Susanne Greenhouse, MD   7 Units at 12/20/15 1800  . insulin aspart (novoLOG) injection 0-5 Units  0-5 Units Subcutaneous QHS Susanne Greenhouse, MD      . isosorbide-hydrALAZINE (BIDIL) 20-37.5 MG per tablet 1 tablet  1 tablet Oral TID Susanne Greenhouse, MD   1 tablet at 12/20/15 2211  . linagliptin (TRADJENTA) tablet 5 mg  5 mg Oral Daily Susanne Greenhouse, MD   5 mg at 12/20/15 0932  . nitroGLYCERIN (NITROSTAT) SL tablet 0.4  mg  0.4 mg Sublingual Q5 Min x 3 PRN Susanne Greenhouse, MD      . ondansetron [facility]) injection 4 mg  4 mg Intravenous Q6H PRN Susanne Greenhouse, MD   4 mg at 12/20/15 1045  . potassium chloride SA (K-DUR,KLOR-CON) CR tablet 40 mEq  40 mEq Oral Daily Susanne Greenhouse, MD   40 mEq at 12/20/15 0932  . pregabalin (LYRICA) capsule 75 mg  75 mg Oral Daily Susanne Greenhouse, MD   75 mg at 12/20/15 0932  . sacubitril-valsartan (ENTRESTO) 49-51 mg per tablet  1 tablet Oral BID Susanne Greenhouse, MD   1 tablet at 12/20/15 2211  . torsemide (DEMADEX) tablet 20 mg  20 mg Oral Daily Susanne Greenhouse, MD   20 mg at 12/20/15 1044    Vitals:   12/20/15 0534 12/20/15 1522 12/20/15 2100 12/21/15 0453  BP: 133/87 (!) 100/56 (!) 101/53 119/67  Pulse: 79 82 87 85  Resp:   15 18  Temp: 98.2 F (36.8 C) 97.4 F (36.3  C) 97.8 F (36.6 C) 98.4 F (36.9 C)  TempSrc: Oral Oral    SpO2: 96% 98% 98% 97%  Weight: 232 lb 9.6 oz (105.5 kg)   236 lb 11.2 oz (107.4 kg)  Height: 5\' 6"  (1.676 m)       PHYSICAL EXAM General: NAD HEENT: Normal. Neck: No JVD, no thyromegaly.  Lungs: Clear to auscultation bilaterally with normal respiratory effort. CV: Nondisplaced PMI.  Regular rate and rhythm, normal S1/S2, no S3/S4, no murmur.  No pretibial edema.  Abdomen: Soft, nontender, obese.  Neurologic: Alert and oriented x 3.  Psych: Normal affect. Musculoskeletal: Normal range of motion. No gross deformities. Extremities: No clubbing or cyanosis.     LABS: Basic Metabolic Panel:  Recent Labs  12/19/15 2131 12/20/15 0929 12/20/15 1750  NA 133* 136  --   K 2.9* 3.1* 4.1  CL 97* 98*  --   CO2 25 28  --   GLUCOSE 269* 171*  --   BUN 10 12  --   CREATININE 0.93 0.93  --   CALCIUM 8.7* 9.0  --    Liver Function Tests:  Recent Labs  12/19/15 2131  AST 23  ALT 20  ALKPHOS 84  BILITOT 0.6  PROT 7.8  ALBUMIN 3.5   No results for input(s): LIPASE, AMYLASE in the last 72 hours. CBC:  Recent Labs   12/19/15 2131 12/20/15 0929 12/21/15 0358  WBC 13.4* 9.5 8.3  NEUTROABS 10.1* 5.9  --   HGB 9.9* 9.9* 9.1*  HCT 32.1* 32.5* 30.0*  MCV 70.5* 71.3* 71.1*  PLT 253 254 254   Cardiac Enzymes:  Recent Labs  12/20/15 0929 12/20/15 1411 12/20/15 1750  TROPONINI 0.03* 0.03* <0.03   BNP: Invalid input(s): POCBNP D-Dimer:  Recent Labs  12/20/15 0929  DDIMER <0.27   Hemoglobin A1C: No results for input(s): HGBA1C in the last 72 hours. Fasting Lipid Panel: No results for input(s): CHOL, HDL, LDLCALC, TRIG, CHOLHDL, LDLDIRECT in the last 72 hours. Thyroid Function Tests: No results for input(s): TSH, T4TOTAL, T3FREE, THYROIDAB in the last 72 hours.  Invalid input(s): FREET3 Anemia Panel:  Recent Labs  12/20/15 0929  FERRITIN 19  TIBC 367  IRON 65    RADIOLOGY: Ct Head Wo Contrast  Result Date: 12/08/2015 CLINICAL DATA:  Initial evaluation for acute headache, elevated blood pressure. EXAM: CT HEAD WITHOUT CONTRAST TECHNIQUE: Contiguous axial images were obtained from the base of the skull through the vertex without intravenous contrast. COMPARISON:  None. FINDINGS: There is no acute intracranial hemorrhage or infarct. No mass lesion or midline shift. Gray-white matter differentiation is well maintained. Ventricles are normal in size without evidence of hydrocephalus. CSF containing spaces are within normal limits. No extra-axial fluid collection. Mild chronic microvascular ischemic changes. The calvarium is intact. Orbital soft tissues are within normal limits. The paranasal sinuses and mastoid air cells are well pneumatized and free of fluid. Scalp soft tissues are unremarkable. IMPRESSION: 1. No acute intracranial process. 2. Mild chronic small vessel ischemic disease. Electronically Signed   By: Jeannine Boga M.D.   On: 12/08/2015 05:48  Dg Chest Portable 1 View  Result Date: 12/19/2015 CLINICAL DATA:  Mid to left lower chest pain, onset today. EXAM: PORTABLE CHEST 1  VIEW COMPARISON:  09/05/2015 FINDINGS: Mild cardiomegaly. No pulmonary edema, confluent airspace disease, pleural effusion or pneumothorax. Osseous structures appear normal per IMPRESSION: Cardiomegaly.  No localizing pulmonary process. Electronically Signed   By: Jeb Levering M.D.   On: 12/19/2015 21:31  ASSESSMENT AND PLAN: 1. Chest pain with new T wave inversions: Given CV risk factors, symptoms worrisome for ischemic heart disease. Plan for cath tomorrow. Continue ASA, Lipitor, Coreg, heparin.  2. Type II DM: Continue Januvia and RISS.  3. Hyperlipidemia: Continue Lipitor.  4. Anemia: Hgb 9.1.  5. Essential HTN: Controlled. No changes.  6. Cardiomyopathy/chronic systolic heart failure: EF 40-45% (had been 50-55% in 08/2015). On Coreg and Entresto. Euvolemic on torsemide. Plan for cath tomorrow.    Kate Sable, M.D., F.A.C.C.

## 2015-12-22 NOTE — Discharge Instructions (Signed)
Radial Site Care °Refer to this sheet in the next few weeks. These instructions provide you with information about caring for yourself after your procedure. Your health care provider may also give you more specific instructions. Your treatment has been planned according to current medical practices, but problems sometimes occur. Call your health care provider if you have any problems or questions after your procedure. °WHAT TO EXPECT AFTER THE PROCEDURE °After your procedure, it is typical to have the following: °· Bruising at the radial site that usually fades within 1-2 weeks. °· Blood collecting in the tissue (hematoma) that may be painful to the touch. It should usually decrease in size and tenderness within 1-2 weeks. °HOME CARE INSTRUCTIONS °· Take medicines only as directed by your health care provider. °· You may shower 24-48 hours after the procedure or as directed by your health care provider. Remove the bandage (dressing) and gently wash the site with plain soap and water. Pat the area dry with a clean towel. Do not rub the site, because this may cause bleeding. °· Do not take baths, swim, or use a hot tub until your health care provider approves. °· Check your insertion site every day for redness, swelling, or drainage. °· Do not apply powder or lotion to the site. °· Do not flex or bend the affected arm for 24 hours or as directed by your health care provider. °· Do not push or pull heavy objects with the affected arm for 24 hours or as directed by your health care provider. °· Do not lift over 10 lb (4.5 kg) for 5 days after your procedure or as directed by your health care provider. °· Ask your health care provider when it is okay to: °¨ Return to work or school. °¨ Resume usual physical activities or sports. °¨ Resume sexual activity. °· Do not drive home if you are discharged the same day as the procedure. Have someone else drive you. °· You may drive 24 hours after the procedure unless otherwise  instructed by your health care provider. °· Do not operate machinery or power tools for 24 hours after the procedure. °· If your procedure was done as an outpatient procedure, which means that you went home the same day as your procedure, a responsible adult should be with you for the first 24 hours after you arrive home. °· Keep all follow-up visits as directed by your health care provider. This is important. °SEEK MEDICAL CARE IF: °· You have a fever. °· You have chills. °· You have increased bleeding from the radial site. Hold pressure on the site. °SEEK IMMEDIATE MEDICAL CARE IF: °· You have unusual pain at the radial site. °· You have redness, warmth, or swelling at the radial site. °· You have drainage (other than a small amount of blood on the dressing) from the radial site. °· The radial site is bleeding, and the bleeding does not stop after 30 minutes of holding steady pressure on the site. °· Your arm or hand becomes pale, cool, tingly, or numb. °  °This information is not intended to replace advice given to you by your health care provider. Make sure you discuss any questions you have with your health care provider. °  °Document Released: 06/05/2010 Document Revised: 05/24/2014 Document Reviewed: 11/19/2013 °Elsevier Interactive Patient Education ©2016 [employer] ° °

## 2015-12-22 NOTE — Interval H&P Note (Signed)
History and Physical Interval Note:  12/22/2015 9:59 AM  Chelsey Santiago  has presented today for surgery, with the diagnosis of unstable angina  The various methods of treatment have been discussed with the patient and family. After consideration of risks, benefits and other options for treatment, the patient has consented to  Procedure(s): Left Heart Cath and Coronary Angiography (N/A) as a surgical intervention .  The patient's history has been reviewed, patient examined, no change in status, stable for surgery.  I have reviewed the patient's chart and labs.  Questions were answered to the patient's satisfaction.   Cath Lab Visit (complete for each Cath Lab visit)  Clinical Evaluation Leading to the Procedure:   ACS: Yes.    Non-ACS:    Anginal Classification: CCS IV  Anti-ischemic medical therapy: Maximal Therapy (2 or more classes of medications)  Non-Invasive Test Results: No non-invasive testing performed  Prior CABG: No previous CABG        Chelsey Santiago [facility] 12/22/2015 9:59 AM

## 2015-12-22 NOTE — Progress Notes (Signed)
Inpatient Diabetes Program Recommendations  AACE/ADA: New Consensus Statement on Inpatient Glycemic Control (2015)  Target Ranges:  Prepandial:   less than 140 mg/dL      Peak postprandial:   less than 180 mg/dL (1-2 hours)      Critically ill patients:  140 - 180 mg/dL  Results for JODY, MITSCH (MRN [id number]) as of 12/22/2015 11:21  Ref. Range 12/21/2015 07:33 12/21/2015 11:44 12/21/2015 16:41 12/21/2015 22:01 12/22/2015 07:39  Glucose-Capillary Latest Ref Range: 65 - 99 mg/dL 191 (H) 223 (H) 170 (H) 244 (H) 201 (H)    Review of Glycemic Control  Diabetes history: DM2 Outpatient Diabetes medications: Metformin 500 mg BID, Januvia 100 mg daily Current orders for Inpatient glycemic control: Tradjenta 5 mg daily, Novolog 0-20 units TID with meals, Novolog 0-5 units QHS  Inpatient Diabetes Program Recommendations: Insulin - Basal: Please consider ordering low dose basal insulin. Recommend starting with Levemir 10 units QHS.  Thanks, Barnie Alderman, RN, MSN, CDE Diabetes Coordinator Inpatient Diabetes Program [phone] (Team Pager from [place] to [place]) [phone] ([place] office) [phone] [facility] [place] office) [phone] [facility] office)

## 2015-12-22 NOTE — Discharge Summary (Signed)
Patient ID: Chelsey Santiago,  MRN: [id number], DOB/AGE: [birth year] 40 y.o.  Admit date: 12/19/2015 Discharge date: 12/22/2015  Primary Care Provider: Elyn Peers, MD Primary Cardiologist: Dr Harl Bowie  Discharge Diagnoses Principal Problem:   Chest pain Active Problems:   Hypertensive cardiovascular disease   Essential hypertension   DM type 2 (diabetes mellitus, type 2) ([place])   Cardiomyopathy due to hypertension ([place])   Normal coronary arteries   Obesity   Sleep apnea   Microcytic anemia   Troponin level elevated    Procedures: Coronary angiogram 8/7/   Hospital Course : 40 y.o. Obese, AA female, followed by Dr Harl Bowie in [place], with past medical history of systolic heart failure, hypertension, hyperlipidemia, non-insulin dependent diabetes, obesity, and obstructive sleep apnea, presented to the ED 12/20/15 for chest pain concerning forUSA. The pt had elevated Troponin (peak 0.05), and new TWI on her EKG. Echo 12/20/15 showed her EF to 40-45% with LVH and grade 2 DD, it was 50-55% in April 2017. Her BNP was 184, and her CXR did not suggest CHF. She underwent diagnostic cath 12/22/15 which revealed normal coronaries and mildly elevated LVEDP, EF was 50-55% at cath. Plan is for continued medical Rx. She is felt to be stable for discharge later on the 7 th. She'll f/u in the [place] office in 1-2 weeks. Glucophage was held x 48 hrs post cath.    Discharge Vitals:  Blood pressure (!) 139/92, pulse 85, temperature 98.3 F (36.8 C), resp. rate 13, height 5\' 6"  (1.676 m), weight 237 lb 9.6 oz (107.8 kg), last menstrual period 12/11/2015, SpO2 98 %, unknown if currently breastfeeding.    Labs: Results for orders placed or performed during the hospital encounter of 12/19/15 (from the past 24 hour(s))  Heparin level (unfractionated)     Status: None   Collection Time: 12/21/15  2:36 PM  Result Value Ref Range   Heparin Unfractionated 0.38 0.30 - 0.70 IU/mL  Glucose, capillary      Status: Abnormal   Collection Time: 12/21/15  4:41 PM  Result Value Ref Range   Glucose-Capillary 170 (H) 65 - 99 mg/dL  Glucose, capillary     Status: Abnormal   Collection Time: 12/21/15 10:01 PM  Result Value Ref Range   Glucose-Capillary 244 (H) 65 - 99 mg/dL  CBC     Status: Abnormal   Collection Time: 12/22/15  5:26 AM  Result Value Ref Range   WBC 7.7 4.0 - 10.5 K/uL   RBC 4.21 3.87 - 5.11 MIL/uL   Hemoglobin 9.0 (L) 12.0 - 15.0 g/dL   HCT 30.2 (L) 36.0 - 46.0 %   MCV 71.7 (L) 78.0 - 100.0 fL   MCH 21.4 (L) 26.0 - 34.0 pg   MCHC 29.8 (L) 30.0 - 36.0 g/dL   RDW 16.3 (H) 11.5 - 15.5 %   Platelets 254 150 - 400 K/uL  Heparin level (unfractionated)     Status: None   Collection Time: 12/22/15  5:26 AM  Result Value Ref Range   Heparin Unfractionated 0.41 0.30 - 0.70 IU/mL  Basic metabolic panel     Status: Abnormal   Collection Time: 12/22/15  5:26 AM  Result Value Ref Range   Sodium 136 135 - 145 mmol/L   Potassium 3.6 3.5 - 5.1 mmol/L   Chloride 100 (L) 101 - 111 mmol/L   CO2 27 22 - 32 mmol/L   Glucose, Bld 192 (H) 65 - 99 mg/dL   BUN 22 (H) 6 -  20 mg/dL   Creatinine, Ser 0.92 0.44 - 1.00 mg/dL   Calcium 9.7 8.9 - 10.3 mg/dL   GFR calc non Af Amer >60 >60 mL/min   GFR calc Af Amer >60 >60 mL/min   Anion gap 9 5 - 15  hCG, serum, qualitative     Status: None   Collection Time: 12/22/15  5:26 AM  Result Value Ref Range   Preg, Serum NEGATIVE NEGATIVE  Protime-INR     Status: None   Collection Time: 12/22/15  5:26 AM  Result Value Ref Range   Prothrombin Time 12.2 11.4 - 15.2 seconds   INR 0.91   Glucose, capillary     Status: Abnormal   Collection Time: 12/22/15  7:39 AM  Result Value Ref Range   Glucose-Capillary 201 (H) 65 - 99 mg/dL   Comment 1 Notify RN    Comment 2 Document in Chart   Glucose, capillary     Status: Abnormal   Collection Time: 12/22/15 11:35 AM  Result Value Ref Range   Glucose-Capillary 186 (H) 65 - 99 mg/dL   Comment 1 Notify RN     Comment 2 Document in Chart     Disposition:  Follow-up Information    Chelsey Dolly, MD .   Specialty:  Cardiology Why:  Dr Nelly Laurence office will contnact you for follow up Contact information: [place] [phone]           Discharge Medications:    Medication List    TAKE these medications   acetaminophen 325 MG tablet Commonly known as:  TYLENOL Take 2 tablets (650 mg total) by mouth every 4 (four) hours as needed for headache or mild pain.   amLODipine 5 MG tablet Commonly known as:  NORVASC Take 2 tablets (10 mg total) by mouth daily.   aspirin EC 81 MG tablet Take 81 mg by mouth daily.   carvedilol 12.5 MG tablet Commonly known as:  COREG TAKE 3 TABLETS BY MOUTH TWICE DAILY WITH A MEAL.   citalopram 20 MG tablet Commonly known as:  CELEXA Take 20 mg by mouth daily as needed (anxiety).   isosorbide-hydrALAZINE 20-37.5 MG tablet Commonly known as:  BIDIL Take 1 tablet by mouth 3 (three) times daily.   metFORMIN 500 MG tablet Commonly known as:  GLUCOPHAGE Take 1 tablet (500 mg total) by mouth 2 (two) times daily with a meal. Start taking on:  12/25/2015   nitroGLYCERIN 0.4 MG SL tablet Commonly known as:  NITROSTAT Place 1 tablet (0.4 mg total) under the tongue every 5 (five) minutes as needed for chest pain.   ondansetron 8 MG disintegrating tablet Commonly known as:  ZOFRAN-ODT Take 1 tablet (8 mg total) by mouth every 8 (eight) hours as needed for nausea or vomiting.   potassium chloride SA 20 MEQ tablet Commonly known as:  K-DUR,KLOR-CON Take 2 tablets (40 mEq total) by mouth daily.   pregabalin 75 MG capsule Commonly known as:  LYRICA Take 75 mg by mouth daily.   ranitidine 150 MG tablet Commonly known as:  ZANTAC Take 150 mg by mouth daily as needed for heartburn.   sacubitril-valsartan 49-51 MG Commonly known as:  ENTRESTO Take 1 tablet by mouth 2 (two) times daily.   sitaGLIPtin 100 MG  tablet Commonly known as:  JANUVIA Take 100 mg by mouth daily.   spironolactone 25 MG tablet Commonly known as:  ALDACTONE Take 1 tablet (25 mg total) by mouth daily.   torsemide  20 MG tablet Commonly known as:  DEMADEX Take 1 tablet (20 mg total) by mouth daily.        Duration of Discharge Encounter: Greater than 30 minutes including physician time.  Signed, Kerin Ransom PA-C 12/22/2015 12:09 PM    I have examined the patient and reviewed assessment and plan and discussed with patient.  Agree with above as stated.  No CAD by cath.  Negative D-dimer.  Continue meds given her prior cardiomyopathy.  Plan discharge later today.  Larae Grooms

## 2016-01-02 ENCOUNTER — Encounter (HOSPITAL_COMMUNITY): Payer: Self-pay

## 2016-01-02 ENCOUNTER — Emergency Department (HOSPITAL_COMMUNITY)
Admission: EM | Admit: 2016-01-02 | Discharge: 2016-01-02 | Disposition: A | Discharge status: Home / Self Care | Payer: No Typology Code available for payment source | Attending: Emergency Medicine | Admitting: Emergency Medicine

## 2016-01-02 DIAGNOSIS — Y92410 Unspecified street and highway as the place of occurrence of the external cause: Secondary | ICD-10-CM | POA: Diagnosis not present

## 2016-01-02 DIAGNOSIS — S29012A Strain of muscle and tendon of back wall of thorax, initial encounter: Secondary | ICD-10-CM | POA: Diagnosis not present

## 2016-01-02 DIAGNOSIS — E119 Type 2 diabetes mellitus without complications: Secondary | ICD-10-CM | POA: Insufficient documentation

## 2016-01-02 DIAGNOSIS — I110 Hypertensive heart disease with heart failure: Secondary | ICD-10-CM | POA: Insufficient documentation

## 2016-01-02 DIAGNOSIS — Z7984 Long term (current) use of oral hypoglycemic drugs: Secondary | ICD-10-CM | POA: Insufficient documentation

## 2016-01-02 DIAGNOSIS — Z79899 Other long term (current) drug therapy: Secondary | ICD-10-CM | POA: Diagnosis not present

## 2016-01-02 DIAGNOSIS — Y939 Activity, unspecified: Secondary | ICD-10-CM | POA: Insufficient documentation

## 2016-01-02 DIAGNOSIS — Y999 Unspecified external cause status: Secondary | ICD-10-CM | POA: Diagnosis not present

## 2016-01-02 DIAGNOSIS — S46819A Strain of other muscles, fascia and tendons at shoulder and upper arm level, unspecified arm, initial encounter: Secondary | ICD-10-CM

## 2016-01-02 DIAGNOSIS — I252 Old myocardial infarction: Secondary | ICD-10-CM | POA: Insufficient documentation

## 2016-01-02 DIAGNOSIS — I5022 Chronic systolic (congestive) heart failure: Secondary | ICD-10-CM | POA: Insufficient documentation

## 2016-01-02 DIAGNOSIS — M79642 Pain in left hand: Secondary | ICD-10-CM | POA: Diagnosis present

## 2016-01-02 DIAGNOSIS — S39012A Strain of muscle, fascia and tendon of lower back, initial encounter: Secondary | ICD-10-CM | POA: Insufficient documentation

## 2016-01-02 DIAGNOSIS — S63502A Unspecified sprain of left wrist, initial encounter: Secondary | ICD-10-CM

## 2016-01-02 DIAGNOSIS — S6392XA Sprain of unspecified part of left wrist and hand, initial encounter: Secondary | ICD-10-CM | POA: Diagnosis not present

## 2016-01-02 MED ORDER — CHLORZOXAZONE 500 MG PO TABS: 500.0000 mg | ORAL_TABLET | Freq: Three times a day (TID) | ORAL | 0 refills | Status: DC | PRN

## 2016-01-02 MED ORDER — PROMETHAZINE HCL 12.5 MG PO TABS: 12.5000 mg | ORAL_TABLET | Freq: Four times a day (QID) | ORAL | 0 refills | Status: DC | PRN

## 2016-01-02 MED ORDER — ACETAMINOPHEN-CODEINE #3 300-30 MG PO TABS: 1.0000 | ORAL_TABLET | Freq: Four times a day (QID) | ORAL | 0 refills | Status: DC | PRN

## 2016-01-02 NOTE — ED Notes (Signed)
Apply Universal Wrist/Forearm Splint  10 inch Left

## 2016-01-02 NOTE — ED Notes (Signed)
Patient came in for wrist pain but is complaining of neck and back pain.
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
## 2016-01-21 ENCOUNTER — Telehealth: Payer: Self-pay | Admitting: Cardiology

## 2016-01-21 NOTE — Telephone Encounter (Signed)
-----   Message from Erlene Quan, [place] sent at 12/22/2015 11:20 AM EDT ----- This pt is going home later today and needs a follow up with Curt Bears in 1-2 weeks (not urgent).  Kerin Ransom PA-C 12/22/2015 11:22 AM

## 2016-01-21 NOTE — Telephone Encounter (Signed)
Have left several messages for patient to call back to schedule post hosp visit with no avail. / tg

## 2016-01-24 ENCOUNTER — Encounter (HOSPITAL_COMMUNITY): Payer: Self-pay | Admitting: Emergency Medicine

## 2016-01-24 ENCOUNTER — Emergency Department (HOSPITAL_COMMUNITY)
Admission: EM | Admit: 2016-01-24 | Discharge: 2016-01-24 | Disposition: A | Discharge status: Home / Self Care | Payer: Medicaid Other | Attending: Emergency Medicine | Admitting: Emergency Medicine

## 2016-01-24 ENCOUNTER — Emergency Department (HOSPITAL_COMMUNITY): Payer: Medicaid Other

## 2016-01-24 DIAGNOSIS — Z7982 Long term (current) use of aspirin: Secondary | ICD-10-CM | POA: Insufficient documentation

## 2016-01-24 DIAGNOSIS — E119 Type 2 diabetes mellitus without complications: Secondary | ICD-10-CM | POA: Insufficient documentation

## 2016-01-24 DIAGNOSIS — I5022 Chronic systolic (congestive) heart failure: Secondary | ICD-10-CM | POA: Diagnosis not present

## 2016-01-24 DIAGNOSIS — R0602 Shortness of breath: Secondary | ICD-10-CM | POA: Diagnosis not present

## 2016-01-24 DIAGNOSIS — I110 Hypertensive heart disease with heart failure: Secondary | ICD-10-CM | POA: Insufficient documentation

## 2016-01-24 DIAGNOSIS — Z7984 Long term (current) use of oral hypoglycemic drugs: Secondary | ICD-10-CM | POA: Insufficient documentation

## 2016-01-24 DIAGNOSIS — G43001 Migraine without aura, not intractable, with status migrainosus: Secondary | ICD-10-CM | POA: Diagnosis not present

## 2016-01-24 DIAGNOSIS — R51 Headache: Secondary | ICD-10-CM | POA: Diagnosis present

## 2016-01-24 DIAGNOSIS — Z79899 Other long term (current) drug therapy: Secondary | ICD-10-CM | POA: Diagnosis not present

## 2016-01-24 LAB — COMPREHENSIVE METABOLIC PANEL
ALK PHOS: 87 U/L (ref 38–126)
ALT: 22 U/L (ref 14–54)
AST: 23 U/L (ref 15–41)
Albumin: 3.2 g/dL — ABNORMAL LOW (ref 3.5–5.0)
Anion gap: 11 (ref 5–15)
BILIRUBIN TOTAL: 0.2 mg/dL — AB (ref 0.3–1.2)
BUN: 10 mg/dL (ref 6–20)
CALCIUM: 8.9 mg/dL (ref 8.9–10.3)
CHLORIDE: 96 mmol/L — AB (ref 101–111)
CO2: 27 mmol/L (ref 22–32)
CREATININE: 0.73 mg/dL (ref 0.44–1.00)
GFR calc Af Amer: >60 mL/min (ref >60)
GFR calc non Af Amer: >60 mL/min (ref >60)
Glucose, Bld: 324 mg/dL — ABNORMAL HIGH (ref 65–99)
Potassium: 3.2 mmol/L — ABNORMAL LOW (ref 3.5–5.1)
Sodium: 134 mmol/L — ABNORMAL LOW (ref 135–145)
Total Protein: 7.4 g/dL (ref 6.5–8.1)

## 2016-01-24 LAB — CBC WITH DIFFERENTIAL/PLATELET
Basophils Absolute: 0 10*3/uL (ref 0.0–0.1)
Basophils Relative: 0 %
EOS PCT: 2 %
Eosinophils Absolute: 0.2 10*3/uL (ref 0.0–0.7)
HEMATOCRIT: 30.3 % — AB (ref 36.0–46.0)
HEMOGLOBIN: 9.2 g/dL — AB (ref 12.0–15.0)
Lymphocytes Relative: 23 %
Lymphs Abs: 2.5 10*3/uL (ref 0.7–4.0)
MCH: 21.6 pg — ABNORMAL LOW (ref 26.0–34.0)
MCHC: 30.4 g/dL (ref 30.0–36.0)
MCV: 71.1 fL — AB (ref 78.0–100.0)
MONOS PCT: 5 %
Monocytes Absolute: 0.5 10*3/uL (ref 0.1–1.0)
NEUTROS PCT: 70 %
Neutro Abs: 7.6 10*3/uL (ref 1.7–7.7)
Platelets: 255 10*3/uL (ref 150–400)
RBC: 4.26 MIL/uL (ref 3.87–5.11)
RDW: 16.4 % — ABNORMAL HIGH (ref 11.5–15.5)
WBC: 10.8 10*3/uL — AB (ref 4.0–10.5)

## 2016-01-24 LAB — BRAIN NATRIURETIC PEPTIDE: B NATRIURETIC PEPTIDE 5: 452 pg/mL — AB (ref 0.0–100.0)

## 2016-01-24 MED ORDER — KETOROLAC TROMETHAMINE 30 MG/ML IJ SOLN
30.0000 mg | Freq: Once | INTRAMUSCULAR | Status: AC
  Administered 2016-01-24: 30 mg via INTRAVENOUS
  Filled 2016-01-24: qty 1

## 2016-01-24 MED ORDER — OXYCODONE-ACETAMINOPHEN 5-325 MG PO TABS: 1.0000 | ORAL_TABLET | Freq: Four times a day (QID) | ORAL | 0 refills | Status: DC | PRN

## 2016-01-24 MED ORDER — METOCLOPRAMIDE HCL 5 MG/ML IJ SOLN
10.0000 mg | Freq: Once | INTRAMUSCULAR | Status: AC
Start: 2016-01-24 — End: 2016-01-24
  Administered 2016-01-24: 10 mg via INTRAVENOUS
  Filled 2016-01-24: qty 2

## 2016-01-24 MED ORDER — DIPHENHYDRAMINE HCL 50 MG/ML IJ SOLN
25.0000 mg | Freq: Once | INTRAMUSCULAR | Status: AC
  Administered 2016-01-24: 25 mg via INTRAVENOUS
  Filled 2016-01-24: qty 1

## 2016-01-24 NOTE — Discharge Instructions (Signed)
Follow up with your md this week. °

## 2016-01-24 NOTE — ED Triage Notes (Signed)
Reports "migraine" since yesterday.  Also c/o cough.

## 2016-01-24 NOTE — ED Provider Notes (Signed)
[facility] DEPT Provider Note   CSN: [id number] Arrival date & time: 01/24/16  1005   By signing my name below, I, Estanislado Pandy, attest that this documentation has been prepared under the direction and in the presence of Milton Ferguson, MD . Electronically Signed: Estanislado Pandy, Scribe. 01/24/2016. 11:03 AM.   History   Chief Complaint Chief Complaint  Patient presents with  . Migraine    Patient complains of a headache   The history is provided by the patient. No language interpreter was used.  Migraine  This is a new problem. The current episode started less than 1 hour ago. The problem occurs rarely. The problem has not changed since onset.Associated symptoms include headaches. Pertinent negatives include no chest pain and no abdominal pain. Nothing aggravates the symptoms. Nothing relieves the symptoms. She has tried nothing for the symptoms. The treatment provided no relief.   HPI Comments:  Chelsey Santiago is a 40 y.o. female with PMHx of migraines and DM who presents to the Emergency Department complaining of a migraine headache onset yesterday. Pt also complains of a productive cough that began last night. Pt has been seen in ED for migraines previously. Pt denies fever, chills.   Past Medical History:  Diagnosis Date  . Anemia    H&H of 10.6/33 and 07/2008 and 11.9/35 and 09/2010  . Anxiety   . CHF (congestive heart failure) ([place])   . Depression with anxiety   . Diabetes mellitus without complication ([place])   . Enlarged heart   . Fasting hyperglycemia   . Hypertension    Lab: Normal BMet except glucose of 118 in 09/2010  . Hypertensive heart disease 2009   Pulmonary edema postpartum; mild to moderate mitral regurgitation when hospitalized for CHF in 2009; Echocardiogram in 12/2009-no MR and normal EF; normal CXR in 09/2010  . Migraine headache   . Miscarriage 03/19/2013  . Obesity 04/16/2009  . Osteoarthritis, knee 03/29/2011  . Preeclampsia   . Pregnant   .  Pulmonary edema   . Sleep apnea   . Threatened abortion in early pregnancy 03/15/2013    Patient Active Problem List   Diagnosis Date Noted  . Cardiomyopathy due to hypertension ([place]) 12/22/2015  . Normal coronary arteries 12/22/2015  . Troponin level elevated 12/22/2015  . NSTEMI (non-ST elevated myocardial infarction) ([place]) 12/20/2015  . Dental infection 10/10/2015  . Chest pain 09/05/2015  . Systolic CHF, chronic ([place]) 09/05/2015  . LLQ pain   . DM type 2 (diabetes mellitus, type 2) ([place]) 04/28/2014  . Essential hypertension   . 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 Hypertensive urgency 04/22/2014  . Acute CHF ([place]) 04/22/2014  . S/P cesarean section 04/11/2014  . Pulmonary edema 04/11/2014  . Postoperative anemia 04/11/2014  . Elevated serum creatinine 04/11/2014  . Preeclampsia, severe 04/09/2014  . Pre-eclampsia superimposed on chronic hypertension, antepartum 04/08/2014  . Dyspnea 04/08/2014  . Polyhydramnios in third trimester, antepartum 03/14/2014  . Abnormal maternal glucose tolerance, antepartum 03/11/2014  . High-risk pregnancy 03/11/2014  . Chronic hypertension in pregnancy 03/11/2014  . Impaired glucose tolerance during pregnancy, antepartum 11/27/2013  . Fibroids 11/22/2013  . History of gestational diabetes in prior pregnancy, currently pregnant in first trimester 11/22/2013  . Hx of preeclampsia, prior pregnancy, currently pregnant 11/22/2013  . Short interval between pregnancies affecting pregnancy, antepartum 11/22/2013  . Supervision of high-risk pregnancy of elderly primigravida (>= 70 years old at delivery), third trimester 11/22/2013  . Miscarriage 03/19/2013  . Major depression ([place]) 09/27/2011  .  Hypertension   . Hypertensive cardiovascular disease   . Microcytic anemia   . Osteoarthritis, knee 03/29/2011  . Sleep apnea 12/09/2009  . Obesity 04/16/2009    Past Surgical History:  Procedure Laterality Date  . BREAST REDUCTION SURGERY  2002  .  CARDIAC CATHETERIZATION N/A 12/22/2015   Procedure: Left Heart Cath and Coronary Angiography;  Surgeon: Peter M Martinique, MD;  Location: [facility];  Service: Cardiovascular;  Laterality: N/A;  . CESAREAN SECTION N/A 04/09/2014   Procedure: CESAREAN SECTION;  Surgeon: Mora Bellman, MD;  Location: [facility];  Service: Obstetrics;  Laterality: N/A;  . CHOLECYSTECTOMY      OB History    Gravida Para Term Preterm AB Living   11 6 5 1 5 6    SAB TAB Ectopic Multiple Live Births   3 2   0 6       Home Medications    Prior to Admission medications   Medication Sig Start Date End Date Taking? Authorizing Provider  acetaminophen (TYLENOL) 325 MG tablet Take 2 tablets (650 mg total) by mouth every 4 (four) hours as needed for headache or mild pain. 12/22/15   Erlene Quan, PA-C  acetaminophen-codeine (TYLENOL #3) 300-30 MG tablet Take 1-2 tablets by mouth every 6 (six) hours as needed for moderate pain. 01/02/16   Lily Kocher, PA-C  amLODipine (NORVASC) 5 MG tablet Take 2 tablets (10 mg total) by mouth daily. 02/24/15   Lendon Colonel, NP  aspirin EC 81 MG tablet Take 81 mg by mouth daily.    Historical Provider, MD  carvedilol (COREG) 12.5 MG tablet TAKE 3 TABLETS BY MOUTH TWICE DAILY WITH A MEAL. 08/21/15   Arnoldo Lenis, MD  chlorzoxazone (PARAFON FORTE DSC) 500 MG tablet Take 1 tablet (500 mg total) by mouth 3 (three) times daily as needed for muscle spasms. 01/02/16   Lily Kocher, PA-C  citalopram (CELEXA) 20 MG tablet Take 20 mg by mouth daily as needed (anxiety).     Historical Provider, MD  isosorbide-hydrALAZINE (BIDIL) 20-37.5 MG tablet Take 1 tablet by mouth 3 (three) times daily.    Historical Provider, MD  metFORMIN (GLUCOPHAGE) 500 MG tablet Take 1 tablet (500 mg total) by mouth 2 (two) times daily with a meal. 12/25/15   Erlene Quan, PA-C  nitroGLYCERIN (NITROSTAT) 0.4 MG SL tablet Place 1 tablet (0.4 mg total) under the tongue every 5 (five) minutes as needed for chest  pain. 09/05/15   Kathie Dike, MD  ondansetron (ZOFRAN-ODT) 8 MG disintegrating tablet Take 1 tablet (8 mg total) by mouth every 8 (eight) hours as needed for nausea or vomiting. 10/07/15   Kalman Drape, PA  potassium chloride SA (K-DUR,KLOR-CON) 20 MEQ tablet Take 2 tablets (40 mEq total) by mouth daily. 03/27/15   Lendon Colonel, NP  pregabalin (LYRICA) 75 MG capsule Take 75 mg by mouth daily.    Historical Provider, MD  promethazine (PHENERGAN) 12.5 MG tablet Take 1 tablet (12.5 mg total) by mouth every 6 (six) hours as needed for nausea or vomiting. 01/02/16   Lily Kocher, PA-C  ranitidine (ZANTAC) 150 MG tablet Take 150 mg by mouth daily as needed for heartburn.    Historical Provider, MD  sacubitril-valsartan (ENTRESTO) 49-51 MG Take 1 tablet by mouth 2 (two) times daily. 09/05/15   Kathie Dike, MD  sitaGLIPtin (JANUVIA) 100 MG tablet Take 100 mg by mouth daily.    Historical Provider, MD  spironolactone (ALDACTONE) 25 MG tablet Take  1 tablet (25 mg total) by mouth daily. 02/24/15   Lendon Colonel, NP  torsemide (DEMADEX) 20 MG tablet Take 1 tablet (20 mg total) by mouth daily. 02/24/15   Lendon Colonel, NP    Family History Family History  Problem Relation Age of Onset  . Diabetes Mother   . Heart disease Mother   . Hyperlipidemia Paternal Grandfather   . Hypertension Paternal Grandfather   . Heart disease Father   . Heart disease Maternal Grandmother   . Hypertension Maternal Uncle   . Heart attack Brother   . Sudden death Neg Hx     Social History Social History  Substance Use Topics  . Smoking status: Never Smoker  . Smokeless tobacco: Never Used  . Alcohol use Yes     Comment: occ     Allergies   Diclofenac; Tramadol; and Vicodin [hydrocodone-acetaminophen]   Review of Systems Review of Systems  Constitutional: Negative for appetite change, chills, fatigue and fever.  HENT: Negative for congestion, ear discharge and sinus pressure.   Eyes:  Negative for discharge.  Respiratory: Positive for cough.   Cardiovascular: Negative for chest pain.  Gastrointestinal: Negative for abdominal pain and diarrhea.  Genitourinary: Negative for frequency and hematuria.  Musculoskeletal: Negative for back pain.  Skin: Negative for rash.  Neurological: Positive for headaches. Negative for seizures.  Psychiatric/Behavioral: Negative for hallucinations.     Physical Exam Updated Vital Signs BP (!) 174/107 (BP Location: Left Arm)   Pulse 103   Temp 97.7 F (36.5 C) (Oral)   Resp 18   LMP 01/09/2016   SpO2 97%   Physical Exam  Constitutional: She is oriented to person, place, and time. She appears well-developed.  HENT:  Head: Normocephalic.  Eyes: Conjunctivae and EOM are normal. No scleral icterus.  Neck: Neck supple. No thyromegaly present.  Cardiovascular: Normal rate and regular rhythm.  Exam reveals no gallop and no friction rub.   No murmur heard. Pulmonary/Chest: No stridor. She has no wheezes. She has no rales. She exhibits no tenderness.  Abdominal: She exhibits no distension. There is no tenderness. There is no rebound.  Musculoskeletal: Normal range of motion. She exhibits no edema.  Lymphadenopathy:    She has no cervical adenopathy.  Neurological: She is oriented to person, place, and time. She exhibits normal muscle tone. Coordination normal.  Skin: No rash noted. No erythema.  Psychiatric: She has a normal mood and affect. Her behavior is normal.     ED Treatments / Results  DIAGNOSTIC STUDIES:  Oxygen Saturation is 100% on RA, normal by my interpretation.    COORDINATION OF CARE:  11:03 AM Discussed treatment plan with pt at bedside and pt agreed to plan.   Labs (all labs ordered are listed, but only abnormal results are displayed) Labs Reviewed - No data to display  EKG  EKG Interpretation None       Radiology No results found.  Procedures Procedures (including critical care  time)  Medications Ordered in ED Medications - No data to display   Initial Impression / Assessment and Plan / ED Course  I have reviewed the triage vital signs and the nursing notes.  Pertinent labs & imaging results that were available during my care of the patient were reviewed by me and considered in my medical decision making (see chart for details).  Clinical Course    Patient's headache improved with migraine cocktail. Labs show her sugar elevated. Follow-up with your family doctor this week  Final Clinical Impressions(s) / ED Diagnoses   Final diagnoses:  None    New Prescriptions New Prescriptions   No medications on file  The chart was scribed for me under my direct supervision.  I personally performed the history, physical, and medical decision making and all procedures in the evaluation of this patient.Milton Ferguson, MD 01/24/16 1256

## 2016-01-29 NOTE — Progress Notes (Signed)
This encounter was created in error - please disregard.

## 2016-02-26 ENCOUNTER — Ambulatory Visit: Payer: Medicaid Other | Admitting: Adult Health

## 2016-02-26 NOTE — Progress Notes (Deleted)
Cardiology Office Note   Date:  02/26/2016   ID:  MARTINEZ HENAULT, DOB [birth year], MRN [id number]  PCP:  Elyn Peers, MD  Cardiologist: Cloria Spring, NP   No chief complaint on file.     History of Present Illness: Chelsey Santiago is a 40 y.o. female who presents for ongoing assessment and management of chronic chest pain, hypertension cardiovascular disease, who was seen in the hospital on 12/22/2015 in the setting of elevated troponin at 0.05 with new T-wave inversion on EKG. She underwent diagnostic heart catheterization which revealed normal coronary arteries and mildly elevated LVEDP, her EF is 50-55%. She was continued on medical therapy. She was seen in the emergency room on 01/24/2016 with complaints of migraine.    Past Medical History:  Diagnosis Date  . Anemia    H&H of 10.6/33 and 07/2008 and 11.9/35 and 09/2010  . Anxiety   . CHF (congestive heart failure) ([place])   . Depression with anxiety   . Diabetes mellitus without complication ([place])   . Enlarged heart   . Fasting hyperglycemia   . Hypertension    Lab: Normal BMet except glucose of 118 in 09/2010  . Hypertensive heart disease 2009   Pulmonary edema postpartum; mild to moderate mitral regurgitation when hospitalized for CHF in 2009; Echocardiogram in 12/2009-no MR and normal EF; normal CXR in 09/2010  . Migraine headache   . Miscarriage 03/19/2013  . Obesity 04/16/2009  . Osteoarthritis, knee 03/29/2011  . Preeclampsia   . Pregnant   . Pulmonary edema   . Sleep apnea   . Threatened abortion in early pregnancy 03/15/2013    Past Surgical History:  Procedure Laterality Date  . BREAST REDUCTION SURGERY  2002  . CARDIAC CATHETERIZATION N/A 12/22/2015   Procedure: Left Heart Cath and Coronary Angiography;  Surgeon: Peter M Martinique, MD;  Location: [facility];  Service: Cardiovascular;  Laterality: N/A;  . CESAREAN SECTION N/A 04/09/2014   Procedure: CESAREAN SECTION;  Surgeon: Mora Bellman,  MD;  Location: [facility];  Service: Obstetrics;  Laterality: N/A;  . CHOLECYSTECTOMY       Current Outpatient Prescriptions  Medication Sig Dispense Refill  . acetaminophen (TYLENOL) 325 MG tablet Take 2 tablets (650 mg total) by mouth every 4 (four) hours as needed for headache or mild pain.    Marland Kitchen acetaminophen-codeine (TYLENOL #3) 300-30 MG tablet Take 1-2 tablets by mouth every 6 (six) hours as needed for moderate pain. (Patient not taking: Reported on 01/24/2016) 15 tablet 0  . amLODipine (NORVASC) 5 MG tablet Take 2 tablets (10 mg total) by mouth daily. 60 tablet 3  . aspirin EC 81 MG tablet Take 81 mg by mouth daily.    . carvedilol (COREG) 12.5 MG tablet TAKE 3 TABLETS BY MOUTH TWICE DAILY WITH A MEAL. 90 tablet 6  . chlorzoxazone (PARAFON FORTE DSC) 500 MG tablet Take 1 tablet (500 mg total) by mouth 3 (three) times daily as needed for muscle spasms. (Patient not taking: Reported on 01/24/2016) 21 tablet 0  . citalopram (CELEXA) 20 MG tablet Take 20 mg by mouth daily as needed (anxiety).     . isosorbide-hydrALAZINE (BIDIL) 20-37.5 MG tablet Take 1 tablet by mouth 3 (three) times daily.    . metFORMIN (GLUCOPHAGE) 500 MG tablet Take 1 tablet (500 mg total) by mouth 2 (two) times daily with a meal.    . nitroGLYCERIN (NITROSTAT) 0.4 MG SL tablet Place 1 tablet (0.4 mg total) under the tongue  every 5 (five) minutes as needed for chest pain. 30 tablet 12  . ondansetron (ZOFRAN-ODT) 8 MG disintegrating tablet Take 1 tablet (8 mg total) by mouth every 8 (eight) hours as needed for nausea or vomiting. (Patient not taking: Reported on 01/24/2016) 10 tablet 0  . oxyCODONE-acetaminophen (PERCOCET/ROXICET) 5-325 MG tablet Take 1 tablet by mouth every 6 (six) hours as needed for severe pain. 10 tablet 0  . potassium chloride SA (K-DUR,KLOR-CON) 20 MEQ tablet Take 2 tablets (40 mEq total) by mouth daily. 30 tablet 6  . pregabalin (LYRICA) 75 MG capsule Take 75 mg by mouth daily.    . promethazine (PHENERGAN)  12.5 MG tablet Take 1 tablet (12.5 mg total) by mouth every 6 (six) hours as needed for nausea or vomiting. (Patient not taking: Reported on 01/24/2016) 12 tablet 0  . ranitidine (ZANTAC) 150 MG tablet Take 150 mg by mouth daily as needed for heartburn.    . sacubitril-valsartan (ENTRESTO) 49-51 MG Take 1 tablet by mouth 2 (two) times daily. 60 tablet 1  . sitaGLIPtin (JANUVIA) 100 MG tablet Take 100 mg by mouth daily.    Marland Kitchen spironolactone (ALDACTONE) 25 MG tablet Take 1 tablet (25 mg total) by mouth daily. 30 tablet 3  . torsemide (DEMADEX) 20 MG tablet Take 1 tablet (20 mg total) by mouth daily. 30 tablet 3   No current facility-administered medications for this visit.     Allergies:   Diclofenac; Tramadol; and Vicodin [hydrocodone-acetaminophen]    Social History:  The patient  reports that she has never smoked. She has never used smokeless tobacco. She reports that she drinks alcohol. She reports that she does not use drugs.   Family History:  The patient's family history includes Diabetes in her mother; Heart attack in her brother; Heart disease in her father, maternal grandmother, and mother; Hyperlipidemia in her paternal grandfather; Hypertension in her maternal uncle and paternal grandfather.    ROS: All other systems are reviewed and negative. Unless otherwise mentioned in H&P    PHYSICAL EXAM: VS:  There were no vitals taken for this visit. , BMI There is no height or weight on file to calculate BMI. GEN: Well nourished, well developed, in no acute distress HEENT: normal Neck: no JVD, carotid bruits, or masses Cardiac: ***RRR; no murmurs, rubs, or gallops,no edema  Respiratory:  clear to auscultation bilaterally, normal work of breathing GI: soft, nontender, nondistended, + BS MS: no deformity or atrophy Skin: warm and dry, no rash Neuro:  Strength and sensation are intact Psych: euthymic mood, full affect   EKG:  EKG {ACTION; IS/IS [id number] ordered today. The ekg  ordered today demonstrates ***   Recent Labs: 01/24/2016: ALT 22; B Natriuretic Peptide 452.0; BUN 10; Creatinine, Ser 0.73; Hemoglobin 9.2; Platelets 255; Potassium 3.2; Sodium 134    Lipid Panel    Component Value Date/Time   CHOL 154 11/20/2004   TRIG 225 11/20/2004   HDL 38 11/20/2004   LDLCALC 71 11/20/2004      Wt Readings from Last 3 Encounters:  01/02/16 234 lb (106.1 kg)  12/22/15 237 lb 9.6 oz (107.8 kg)  12/08/15 246 lb (111.6 kg)      Other studies Reviewed: Additional studies/ records that were reviewed today include: ***. Review of the above records demonstrates: ***   ASSESSMENT AND PLAN:  1.  ***   Current medicines are reviewed at length with the patient today.    Labs/ tests ordered today include: *** No orders of the  defined types were placed in this encounter.    Disposition:   FU with *** in {gen number [id number] {TIME; UNITS DAY/WEEK/MONTH:19136}   Signed, Jory Sims, NP  02/26/2016 7:40 AM    [facility] [address], [place], [place] [address] Phone: [phone]; Fax: [phone] the morning he went

## 2016-03-11 ENCOUNTER — Ambulatory Visit (INDEPENDENT_AMBULATORY_CARE_PROVIDER_SITE_OTHER): Payer: Medicaid Other | Admitting: Adult Health

## 2016-03-11 ENCOUNTER — Encounter: Payer: Self-pay | Admitting: Adult Health

## 2016-03-11 VITALS — BP 168/88 | HR 95 | Ht 66.0 in | Wt 239.0 lb

## 2016-03-11 DIAGNOSIS — F32A Depression, unspecified: Secondary | ICD-10-CM

## 2016-03-11 DIAGNOSIS — D649 Anemia, unspecified: Secondary | ICD-10-CM

## 2016-03-11 DIAGNOSIS — I10 Essential (primary) hypertension: Secondary | ICD-10-CM

## 2016-03-11 DIAGNOSIS — F329 Major depressive disorder, single episode, unspecified: Secondary | ICD-10-CM

## 2016-03-11 DIAGNOSIS — R7309 Other abnormal glucose: Secondary | ICD-10-CM | POA: Diagnosis not present

## 2016-03-11 DIAGNOSIS — Z9989 Dependence on other enabling machines and devices: Secondary | ICD-10-CM | POA: Diagnosis not present

## 2016-03-11 NOTE — Progress Notes (Signed)
Cardiology Office Note   Date:  03/11/2016   ID:  AZAYLIA SKENANDORE, DOB May 19, 1975, MRN [id number]  PCP:  Elyn Peers, MD  Cardiologist: Cloria Spring, NP   Chief Complaint  Patient presents with  . Chest Pain  . Hypertension      History of Present Illness: Chelsey Santiago is a 40 y.o. female who presents for ongoing assessment and management of systolic heart failure, hypertension, hyperlipidemia, with known history of non-insulin-dependent diabetes obesity and OSA. The patient was recently admitted to the hospital in the setting of chest pain concerning for unstable angina. The patient underwent diagnostic cardiac catheterization revealing normal coronary anatomy with mildly elevated LVEDP. EF was 50-55% per cath. She was continued on medical management.  She has not followed up up with /GYN, or psychiatry since discharge. She is in the middle of changing insurance to Medicaid. She continues to panic attacks, hypertension, racing heart rate, and weight gain. She also depression. Multiple somatic complaints.  Past Medical History:  Diagnosis Date  . Anemia    H&H of 10.6/33 and 07/2008 and 11.9/35 and 09/2010  . Anxiety   . CHF (congestive heart failure) ([place])   . Depression with anxiety   . Diabetes mellitus without complication ([place])   . Enlarged heart   . Fasting hyperglycemia   . Hypertension    Lab: Normal BMet except glucose of 118 in 09/2010  . Hypertensive heart disease 2009   Pulmonary edema postpartum; mild to moderate mitral regurgitation when hospitalized for CHF in 2009; Echocardiogram in 12/2009-no MR and normal EF; normal CXR in 09/2010  . Migraine headache   . Miscarriage 03/19/2013  . Obesity 04/16/2009  . Osteoarthritis, knee 03/29/2011  . Preeclampsia   . Pregnant   . Pulmonary edema   . Sleep apnea   . Threatened abortion in early pregnancy 03/15/2013    Past Surgical History:  Procedure Laterality Date  . BREAST REDUCTION SURGERY  2002  .  CARDIAC CATHETERIZATION N/A 12/22/2015   Procedure: Left Heart Cath and Coronary Angiography;  Surgeon: Peter M Martinique, MD;  Location: [facility];  Service: Cardiovascular;  Laterality: N/A;  . CESAREAN SECTION N/A 04/09/2014   Procedure: CESAREAN SECTION;  Surgeon: Mora Bellman, MD;  Location: [facility];  Service: Obstetrics;  Laterality: N/A;  . CHOLECYSTECTOMY       Current Outpatient Prescriptions  Medication Sig Dispense Refill  . acetaminophen (TYLENOL) 325 MG tablet Take 2 tablets (650 mg total) by mouth every 4 (four) hours as needed for headache or mild pain.    Marland Kitchen acetaminophen-codeine (TYLENOL #3) 300-30 MG tablet Take 1-2 tablets by mouth every 6 (six) hours as needed for moderate pain. 15 tablet 0  . amLODipine (NORVASC) 5 MG tablet Take 2 tablets (10 mg total) by mouth daily. 60 tablet 3  . aspirin EC 81 MG tablet Take 81 mg by mouth daily.    . carvedilol (COREG) 12.5 MG tablet TAKE 3 TABLETS BY MOUTH TWICE DAILY WITH A MEAL. 90 tablet 6  . chlorzoxazone (PARAFON FORTE DSC) 500 MG tablet Take 1 tablet (500 mg total) by mouth 3 (three) times daily as needed for muscle spasms. 21 tablet 0  . citalopram (CELEXA) 20 MG tablet Take 20 mg by mouth daily as needed (anxiety).     . isosorbide-hydrALAZINE (BIDIL) 20-37.5 MG tablet Take 1 tablet by mouth 3 (three) times daily.    . metFORMIN (GLUCOPHAGE) 500 MG tablet Take 1 tablet (500 mg total) by  mouth 2 (two) times daily with a meal.    . nitroGLYCERIN (NITROSTAT) 0.4 MG SL tablet Place 1 tablet (0.4 mg total) under the tongue every 5 (five) minutes as needed for chest pain. 30 tablet 12  . ondansetron (ZOFRAN-ODT) 8 MG disintegrating tablet Take 1 tablet (8 mg total) by mouth every 8 (eight) hours as needed for nausea or vomiting. 10 tablet 0  . oxyCODONE-acetaminophen (PERCOCET/ROXICET) 5-325 MG tablet Take 1 tablet by mouth every 6 (six) hours as needed for severe pain. 10 tablet 0  . potassium chloride SA (K-DUR,KLOR-CON) 20  MEQ tablet Take 2 tablets (40 mEq total) by mouth daily. 30 tablet 6  . pregabalin (LYRICA) 75 MG capsule Take 75 mg by mouth daily.    . promethazine (PHENERGAN) 12.5 MG tablet Take 1 tablet (12.5 mg total) by mouth every 6 (six) hours as needed for nausea or vomiting. 12 tablet 0  . ranitidine (ZANTAC) 150 MG tablet Take 150 mg by mouth daily as needed for heartburn.    . sacubitril-valsartan (ENTRESTO) 49-51 MG Take 1 tablet by mouth 2 (two) times daily. 60 tablet 1  . sitaGLIPtin (JANUVIA) 100 MG tablet Take 100 mg by mouth daily.    Marland Kitchen spironolactone (ALDACTONE) 25 MG tablet Take 1 tablet (25 mg total) by mouth daily. 30 tablet 3  . torsemide (DEMADEX) 20 MG tablet Take 1 tablet (20 mg total) by mouth daily. 30 tablet 3   No current facility-administered medications for this visit.     Allergies:   Diclofenac; Tramadol; and Vicodin [hydrocodone-acetaminophen]    Social History:  The patient  reports that she has never smoked. She has never used smokeless tobacco. She reports that she drinks alcohol. She reports that she does not use drugs.   Family History:  The patient's family history includes Diabetes in her mother; Heart attack in her brother; Heart disease in her father, maternal grandmother, and mother; Hyperlipidemia in her paternal grandfather; Hypertension in her maternal uncle and paternal grandfather.    ROS: All other systems are reviewed and negative. Unless otherwise mentioned in H&P    PHYSICAL EXAM: VS:  BP (!) 168/88   Pulse 95   Ht 5\' 6"  (1.676 m)   Wt 239 lb (108.4 kg)   SpO2 97%   BMI 38.58 kg/m  , BMI Body mass index is 38.58 kg/m. GEN: Well nourished, well developed, in no acute distress obese HEENT: normal  Neck: no JVD, carotid bruits, or masses Cardiac: RRR; no murmurs, rubs, or gallops,no edema  Respiratory:  clear to auscultation bilaterally, normal work of breathing GI: soft, nontender, nondistended, + BS MS: no deformity or atrophy  Skin: warm  and dry, no rash Neuro:  Strength and sensation are intact Psych: euthymic mood, full affect   Recent Labs: 01/24/2016: ALT 22; B Natriuretic Peptide 452.0; BUN 10; Creatinine, Ser 0.73; Hemoglobin 9.2; Platelets 255; Potassium 3.2; Sodium 134    Lipid Panel    Component Value Date/Time   CHOL 154 11/20/2004   TRIG 225 11/20/2004   HDL 38 11/20/2004   LDLCALC 71 11/20/2004      Wt Readings from Last 3 Encounters:  03/11/16 239 lb (108.4 kg)  01/02/16 234 lb (106.1 kg)  12/22/15 237 lb 9.6 oz (107.8 kg)     ASSESSMENT AND PLAN:  1. Chest pain: normal catheterization this month. Continue carvedilol, aspirin, and isosorbide. No further cardiac testing will be necessary at this time.  2. Hypertension: Difficult to control. I will  continue her on amlodipine, Entresto, spironolactone, and torsemide. She is not wearing her CPAP as directed. This is probably contributing to difficult to control hypertension. She is advised to follow-up with Dr. Luan Pulling for CPAP titration.  3. Panic attacks: Uncertain etiology at this time. We'll rule out medical causes of rapid heart rhythm and diaphoresis. Which check TSH see med CBC and anemia profile. She is referred back to mental health for ongoing treatment.  4. Postpartum depression: She has not followed up with her GYN physician. She is being referred to Dr. Glo Herring. He will manage any further issues concerning this at his discretion.  Labs/ tests ordered today include:   Orders Placed This Encounter  Procedures  . TSH  . Comprehensive Metabolic Panel (CMET)  . CBC with Differential  . HgB A1c  . Ferritin  . Retic  . Folate  . B12  . Ambulatory referral to Pulmonology  . Ambulatory referral to Gynecology     Disposition:   FU with 6 months Signed, Jory Sims, NP  03/11/2016 5:31 PM    [facility] [address] [place], [place] [address] Phone: [phone]; Fax: [phone]

## 2016-03-11 NOTE — Patient Instructions (Signed)
Medication Instructions:  Your physician recommends that you continue on your current medications as directed. Please refer to the Current Medication list given to you today.   Labwork: Your physician recommends that you return for lab work in: ASAP    Testing/Procedures: NONE  Follow-Up: Your physician wants you to follow-up in 6 MONTHS.  You will receive a reminder letter in the mail two months in advance. If you don't receive a letter, please call our office to schedule the follow-up appointment.   Any Other Special Instructions Will Be Listed Below (If Applicable). YOU HAVE BEEN REFERRED TO DR. HAWKINS FOR CPAP   I HAVE LEFT A MESSAGE FOR DR. FERGUSON     If you need a refill on your cardiac medications before your next appointment, please call your pharmacy.

## 2016-03-11 NOTE — Progress Notes (Signed)
Name: Chelsey Santiago    DOB: 05-06-1976  Age: 40 y.o.  MR#: [id number]       PCP:  Elyn Peers, MD      Insurance: Payor: MEDICAID  / Plan: MEDICAID Reading ACCESS / Product Type: *No Product type* /   CC:   No chief complaint on file.   VS Vitals:   03/11/16 1532  Pulse: 95  SpO2: 97%  Weight: 239 lb (108.4 kg)  Height: 5\' 6"  (1.676 m)    Weights Current Weight  03/11/16 239 lb (108.4 kg)  01/02/16 234 lb (106.1 kg)  12/22/15 237 lb 9.6 oz (107.8 kg)    Blood Pressure  BP Readings from Last 3 Encounters:  01/24/16 (!) 172/111  01/02/16 136/82  12/22/15 136/86     Admit date:  (Not on file) Last encounter with RMR:  Visit date not found   Allergy Diclofenac; Tramadol; and Vicodin [hydrocodone-acetaminophen]  Current Outpatient Prescriptions  Medication Sig Dispense Refill  . acetaminophen (TYLENOL) 325 MG tablet Take 2 tablets (650 mg total) by mouth every 4 (four) hours as needed for headache or mild pain.    Marland Kitchen acetaminophen-codeine (TYLENOL #3) 300-30 MG tablet Take 1-2 tablets by mouth every 6 (six) hours as needed for moderate pain. 15 tablet 0  . amLODipine (NORVASC) 5 MG tablet Take 2 tablets (10 mg total) by mouth daily. 60 tablet 3  . aspirin EC 81 MG tablet Take 81 mg by mouth daily.    . carvedilol (COREG) 12.5 MG tablet TAKE 3 TABLETS BY MOUTH TWICE DAILY WITH A MEAL. 90 tablet 6  . chlorzoxazone (PARAFON FORTE DSC) 500 MG tablet Take 1 tablet (500 mg total) by mouth 3 (three) times daily as needed for muscle spasms. 21 tablet 0  . citalopram (CELEXA) 20 MG tablet Take 20 mg by mouth daily as needed (anxiety).     . isosorbide-hydrALAZINE (BIDIL) 20-37.5 MG tablet Take 1 tablet by mouth 3 (three) times daily.    . metFORMIN (GLUCOPHAGE) 500 MG tablet Take 1 tablet (500 mg total) by mouth 2 (two) times daily with a meal.    . nitroGLYCERIN (NITROSTAT) 0.4 MG SL tablet Place 1 tablet (0.4 mg total) under the tongue every 5 (five) minutes as needed for chest  pain. 30 tablet 12  . ondansetron (ZOFRAN-ODT) 8 MG disintegrating tablet Take 1 tablet (8 mg total) by mouth every 8 (eight) hours as needed for nausea or vomiting. 10 tablet 0  . oxyCODONE-acetaminophen (PERCOCET/ROXICET) 5-325 MG tablet Take 1 tablet by mouth every 6 (six) hours as needed for severe pain. 10 tablet 0  . potassium chloride SA (K-DUR,KLOR-CON) 20 MEQ tablet Take 2 tablets (40 mEq total) by mouth daily. 30 tablet 6  . pregabalin (LYRICA) 75 MG capsule Take 75 mg by mouth daily.    . promethazine (PHENERGAN) 12.5 MG tablet Take 1 tablet (12.5 mg total) by mouth every 6 (six) hours as needed for nausea or vomiting. 12 tablet 0  . ranitidine (ZANTAC) 150 MG tablet Take 150 mg by mouth daily as needed for heartburn.    . sacubitril-valsartan (ENTRESTO) 49-51 MG Take 1 tablet by mouth 2 (two) times daily. 60 tablet 1  . sitaGLIPtin (JANUVIA) 100 MG tablet Take 100 mg by mouth daily.    Marland Kitchen spironolactone (ALDACTONE) 25 MG tablet Take 1 tablet (25 mg total) by mouth daily. 30 tablet 3  . torsemide (DEMADEX) 20 MG tablet Take 1 tablet (20 mg total) by mouth daily. [place]  tablet 3   No current facility-administered medications for this visit.     Discontinued Meds:   There are no discontinued medications.  Patient Active Problem List   Diagnosis Date Noted  . Cardiomyopathy due to hypertension ([place]) 12/22/2015  . Normal coronary arteries 12/22/2015  . Troponin level elevated 12/22/2015  . NSTEMI (non-ST elevated myocardial infarction) ([place]) 12/20/2015  . Dental infection 10/10/2015  . Chest pain 09/05/2015  . Systolic CHF, chronic ([place]) 09/05/2015  . LLQ pain   . DM type 2 (diabetes mellitus, type 2) ([place]) 04/28/2014  . Essential hypertension   . 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 Hypertensive urgency 04/22/2014  . Acute CHF ([place]) 04/22/2014  . S/P cesarean section 04/11/2014  . Pulmonary edema 04/11/2014  . Postoperative anemia 04/11/2014  . Elevated serum creatinine  04/11/2014  . Preeclampsia, severe 04/09/2014  . Pre-eclampsia superimposed on chronic hypertension, antepartum 04/08/2014  . Dyspnea 04/08/2014  . Polyhydramnios in third trimester, antepartum 03/14/2014  . Abnormal maternal glucose tolerance, antepartum 03/11/2014  . High-risk pregnancy 03/11/2014  . Chronic hypertension in pregnancy 03/11/2014  . Impaired glucose tolerance during pregnancy, antepartum 11/27/2013  . Fibroids 11/22/2013  . History of gestational diabetes in prior pregnancy, currently pregnant in first trimester 11/22/2013  . Hx of preeclampsia, prior pregnancy, currently pregnant 11/22/2013  . Short interval between pregnancies affecting pregnancy, antepartum 11/22/2013  . Supervision of high-risk pregnancy of elderly primigravida (>= 19 years old at delivery), third trimester 11/22/2013  . Miscarriage 03/19/2013  . 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 Hypertension   . Hypertensive cardiovascular disease   . Microcytic anemia   . Osteoarthritis, knee 03/29/2011  . Sleep apnea 12/09/2009  . Obesity 04/16/2009    LABS    Component Value Date/Time   NA 134 (L) 01/24/2016 1135   NA 136 12/22/2015 0526   NA 136 12/20/2015 0929   K 3.2 (L) 01/24/2016 1135   K 3.6 12/22/2015 0526   K 4.1 12/20/2015 1750   CL 96 (L) 01/24/2016 1135   CL 100 (L) 12/22/2015 0526   CL 98 (L) 12/20/2015 0929   CO2 27 01/24/2016 1135   CO2 27 12/22/2015 0526   CO2 28 12/20/2015 0929   GLUCOSE 324 (H) 01/24/2016 1135   GLUCOSE 192 (H) 12/22/2015 0526   GLUCOSE 171 (H) 12/20/2015 0929   BUN 10 01/24/2016 1135   BUN 22 (H) 12/22/2015 0526   BUN 12 12/20/2015 0929   CREATININE 0.73 01/24/2016 1135   CREATININE 0.92 12/22/2015 0526   CREATININE 0.93 12/20/2015 0929   CREATININE 0.50 10/18/2013 0942   CREATININE 0.76 07/12/2011 1217   CREATININE 0.84 09/16/2010 1412   CALCIUM 8.9 01/24/2016 1135   CALCIUM 9.7 12/22/2015 0526   CALCIUM 9.0 12/20/2015 0929   GFRNONAA >60 01/24/2016 1135    GFRNONAA >60 12/22/2015 0526   GFRNONAA >60 12/20/2015 0929   GFRAA >60 01/24/2016 1135   GFRAA >60 12/22/2015 0526   GFRAA >60 12/20/2015 0929   CMP     Component Value Date/Time   NA 134 (L) 01/24/2016 1135   K 3.2 (L) 01/24/2016 1135   CL 96 (L) 01/24/2016 1135   CO2 27 01/24/2016 1135   GLUCOSE 324 (H) 01/24/2016 1135   BUN 10 01/24/2016 1135   CREATININE 0.73 01/24/2016 1135   CREATININE 0.50 10/18/2013 0942   CALCIUM 8.9 01/24/2016 1135   PROT 7.4 01/24/2016 1135   ALBUMIN 3.2 (L) 01/24/2016 1135   AST 23 01/24/2016 1135   ALT 22 01/24/2016  1135   ALKPHOS 87 01/24/2016 1135   BILITOT 0.2 (L) 01/24/2016 1135   GFRNONAA >60 01/24/2016 1135   GFRAA >60 01/24/2016 1135       Component Value Date/Time   WBC 10.8 (H) 01/24/2016 1135   WBC 7.7 12/22/2015 0526   WBC 8.3 12/21/2015 0358   HGB 9.2 (L) 01/24/2016 1135   HGB 9.0 (L) 12/22/2015 0526   HGB 9.1 (L) 12/21/2015 0358   HCT 30.3 (L) 01/24/2016 1135   HCT 30.2 (L) 12/22/2015 0526   HCT 30.0 (L) 12/21/2015 0358   MCV 71.1 (L) 01/24/2016 1135   MCV 71.7 (L) 12/22/2015 0526   MCV 71.1 (L) 12/21/2015 0358    Lipid Panel     Component Value Date/Time   CHOL 154 11/20/2004   TRIG 225 11/20/2004   HDL 38 11/20/2004   LDLCALC 71 11/20/2004    ABG    Component Value Date/Time   TCO2 28 09/21/2010 1314     Lab Results  Component Value Date   TSH 1.110 04/22/2014   BNP (last 3 results)  Recent Labs  09/04/15 2320 01/24/16 1135  BNP 184.0* 452.0*    ProBNP (last 3 results) No results for input(s): PROBNP in the last 8760 hours.  Cardiac Panel (last 3 results) No results for input(s): CKTOTAL, CKMB, TROPONINI, RELINDX in the last 72 hours.  Iron/TIBC/Ferritin/ %Sat    Component Value Date/Time   IRON 65 12/20/2015 0929   TIBC 367 12/20/2015 0929   FERRITIN 19 12/20/2015 0929   IRONPCTSAT 18 12/20/2015 0929     EKG Orders placed or performed during the hospital encounter of 01/24/16  . ED  EKG  . ED EKG  . EKG 12-Lead  . EKG 12-Lead  . EKG     Prior Assessment and Plan Problem List as of 03/11/2016 Reviewed: 12/22/2015 11:32 AM by Kerin Ransom, PA-C     Cardiovascular and Mediastinum   Hypertensive cardiovascular disease   Last Assessment & Plan 05/23/2012 Office Visit Written 05/23/2012  2:07 PM by Lendon Colonel, NP    Her BP is not well controlled. I have retaken the BP manually in the exam room and found it to be 162./100 with HR of 98.  I will increase labetalol to 400 mg BID and have asked her not to take the Goody Powders during her pregnancy. She will continue procardia 90- mg as directed. She will follow up with Dr.Rothbart in 2 months for carefull monitoring of her BP during pregnancy to avoid CHF or hypertensive urgency.      Hypertension   Last Assessment & Plan 08/04/2012 Routine Prenatal Written 08/04/2012 11:19 AM by Florian Buff, MD    Labetalol 400 BID      Chronic hypertension in pregnancy   Pre-eclampsia superimposed on chronic hypertension, antepartum   Preeclampsia, severe   Hypertensive urgency   Acute CHF [facility])   Last Assessment & Plan 05/14/2014 Office Visit Written 05/14/2014  4:29 PM by Lendon Colonel, NP    She is doing very well. She is still losing wt and watching salt. She denies edema or DOE. She is medically compliant. Will continue her on curent regimen. I will request labs from [facility] to evaluate kidney fx status. She will be seen again in 3 months unless symptomatic. She is congratulated on her progress.       Resistant hypertension   Essential hypertension   Last Assessment & Plan 05/14/2014 Office Visit Written 05/14/2014  4:31 PM by  Lendon Colonel, NP    Better controlled. Rechecked in exam room. 142/76. She will continue current medication regimen. Watch closely as she continues to lose weight. See again in 3 months.      Systolic CHF, chronic (HCC)   NSTEMI (non-ST elevated myocardial infarction) ([place])   Cardiomyopathy  due to hypertension [facility])     Respiratory   Sleep apnea   Pulmonary edema     Digestive   Dental infection     Endocrine   Impaired glucose tolerance during pregnancy, antepartum   Abnormal maternal glucose tolerance, antepartum   DM type 2 (diabetes mellitus, type 2) ([place])     Musculoskeletal and Integument   Osteoarthritis, knee   Last Assessment & Plan 07/12/2011 Office Visit Written 07/12/2011  1:32 PM by Yehuda Savannah, MD    Patient has been told of severe degenerative joint disease of both knees.  Treatment has been difficult either due to lack of efficacy or adverse effects.  Injections with hyaluronic acid have not been tried, and/or an alternative therapeutic consideration.  She also has plans to be seen in a local pain clinic.  She has been told that medically she requires TKA, but is too young to reasonably undergo bilateral procedures.        Genitourinary   Fibroids     Other   Obesity   Last Assessment & Plan 05/14/2014 Office Visit Written 05/14/2014  4:33 PM by Lendon Colonel, NP    She has lost approx 70 lbs from recent hospitalization and diureses and cholecystectomy. She states that she is watching salt and isn't hungry. I have congratulated her on her weight loss and have encouraged her to maintain healthy lifestyle.       Microcytic anemia   Last Assessment & Plan 07/12/2011 Office Visit Written 07/12/2011  1:25 PM by Yehuda Savannah, MD    Mild anemia in 2010 had virtually resolved by 09/2010; CBC will be repeated.      Major depression   Last Assessment & Plan 09/27/2011 Office Visit Written 09/28/2011  1:27 PM by Carolin Guernsey, MD    Poorly controlled per patient, but she has follow-up with [facility] regarding this.       Miscarriage   History of gestational diabetes in prior pregnancy, currently pregnant in first trimester   Hx of preeclampsia, prior pregnancy, currently pregnant   Short interval between pregnancies affecting pregnancy,  antepartum   Supervision of high-risk pregnancy of elderly primigravida (>= 73 years old at delivery), third trimester   High-risk pregnancy   Polyhydramnios in third trimester, antepartum   Dyspnea   S/P cesarean section   Postoperative anemia   Elevated serum creatinine   LLQ pain   Chest pain   Normal coronary arteries   Troponin level elevated       Imaging: No results found.

## 2016-03-12 LAB — CBC WITH DIFFERENTIAL/PLATELET
BASOS PCT: 0 %
Basophils Absolute: 0 cells/uL (ref 0–200)
EOS ABS: 63 {cells}/uL (ref 15–500)
Eosinophils Relative: 1 %
HCT: 36.8 % (ref 35.0–45.0)
Hemoglobin: 10.8 g/dL — ABNORMAL LOW (ref 11.7–15.5)
LYMPHS PCT: 32 %
Lymphs Abs: 2016 cells/uL (ref 850–3900)
MCH: 21.5 pg — ABNORMAL LOW (ref 27.0–33.0)
MCHC: 29.3 g/dL — ABNORMAL LOW (ref 32.0–36.0)
MCV: 73.3 fL — AB (ref 80.0–100.0)
MONOS PCT: 5 %
MPV: 10.5 fL (ref 7.5–12.5)
Monocytes Absolute: 315 cells/uL (ref 200–950)
NEUTROS ABS: 3906 {cells}/uL (ref 1500–7800)
Neutrophils Relative %: 62 %
PLATELETS: 215 10*3/uL (ref 140–400)
RBC: 5.02 MIL/uL (ref 3.80–5.10)
RDW: 18.2 % — AB (ref 11.0–15.0)
WBC: 6.3 10*3/uL (ref 3.8–10.8)

## 2016-03-12 LAB — COMPREHENSIVE METABOLIC PANEL
ALBUMIN: 3.4 g/dL — AB (ref 3.6–5.1)
ALT: 19 U/L (ref 6–29)
AST: 16 U/L (ref 10–30)
Alkaline Phosphatase: 73 U/L (ref 33–115)
BILIRUBIN TOTAL: 0.3 mg/dL (ref 0.2–1.2)
BUN: 13 mg/dL (ref 7–25)
CALCIUM: 8.8 mg/dL (ref 8.6–10.2)
CHLORIDE: 96 mmol/L — AB (ref 98–110)
CO2: 26 mmol/L (ref 20–31)
Creat: 1 mg/dL (ref 0.50–1.10)
Glucose, Bld: 345 mg/dL — ABNORMAL HIGH (ref 65–99)
Potassium: 3.7 mmol/L (ref 3.5–5.3)
Sodium: 136 mmol/L (ref 135–146)
TOTAL PROTEIN: 6.8 g/dL (ref 6.1–8.1)

## 2016-03-12 LAB — FERRITIN: FERRITIN: 11 ng/mL (ref 10–232)

## 2016-03-12 LAB — FOLATE: Folate: 13 ng/mL (ref >5.4)

## 2016-03-12 LAB — HEMOGLOBIN A1C
HEMOGLOBIN A1C: 9.8 % — AB (ref <5.7)
MEAN PLASMA GLUCOSE: 235 mg/dL

## 2016-03-12 LAB — VITAMIN B12: Vitamin B-12: 570 pg/mL (ref 200–1100)

## 2016-03-12 LAB — RETICULOCYTES
ABS Retic: 90360 cells/uL — ABNORMAL HIGH (ref 20000–80000)
RBC.: 5.02 MIL/uL (ref 3.80–5.10)
RETIC CT PCT: 1.8 %

## 2016-03-12 LAB — TSH: TSH: 0.48 mIU/L

## 2016-03-15 ENCOUNTER — Telehealth: Payer: Self-pay | Admitting: *Deleted

## 2016-03-15 NOTE — Telephone Encounter (Signed)
Called patient with test results. No answer. Left message to call back.  

## 2016-03-15 NOTE — Telephone Encounter (Signed)
-----   Message from Lendon Colonel, NP sent at 03/15/2016  1:40 PM EDT ----- Diabetes is very poorly controlled. See PCP for medical management.Will need to continue  Current regimen. Awaiting TSH. No changes from cardiac standpoint.

## 2016-03-24 ENCOUNTER — Encounter: Payer: Self-pay | Admitting: Adult Health

## 2016-03-24 ENCOUNTER — Ambulatory Visit: Payer: Medicaid Other | Admitting: Adult Health

## 2016-03-26 ENCOUNTER — Encounter: Payer: Self-pay | Admitting: Adult Health

## 2016-03-26 ENCOUNTER — Ambulatory Visit (INDEPENDENT_AMBULATORY_CARE_PROVIDER_SITE_OTHER): Payer: Medicaid Other | Admitting: Adult Health

## 2016-03-26 VITALS — BP 170/90 | HR 92 | Ht 66.0 in | Wt 253.0 lb

## 2016-03-26 DIAGNOSIS — F329 Major depressive disorder, single episode, unspecified: Secondary | ICD-10-CM

## 2016-03-26 DIAGNOSIS — F32A Depression, unspecified: Secondary | ICD-10-CM

## 2016-03-26 MED ORDER — ESCITALOPRAM OXALATE 20 MG PO TABS: 20.0000 mg | ORAL_TABLET | Freq: Every day | ORAL | 6 refills | Status: DC

## 2016-03-26 NOTE — Progress Notes (Addendum)
Subjective:     Patient ID: Chelsey Santiago, female   DOB: 08-07-1975, 40 y.o.   MRN: [id number]  HPI Chelsey Santiago is a 40 year old black female in complaining of being depressed, want to sleep all the time, teary.She says she is tired of bing sick and has felt depressed since daughter born 2 years ago.Had been seen at Mental health, stopped Celexa about 11 months ago.She had MI 12/19/15 she says.Not happy with weight and how body looks after C section.Boyfriend in jail for 1 more year, and he called while she was here. PCP is Dr Legrand Rams, and sees [facility].  Review of Systems +depression, wants to sleep all the time Reviewed past medical,surgical, social and family history. Reviewed medications and allergies.     Objective:   Physical Exam BP (!) 170/90 (BP Location: Left Arm, Patient Position: Sitting, Cuff Size: Large)   Pulse 92   Ht 5\' 6"  (1.676 m)   Wt 253 lb (114.8 kg)   LMP 03/05/2016 (Approximate)   Breastfeeding? No   BMI 40.84 kg/m  Skin warm and dry. Lungs: clear to ausculation bilaterally. Cardiovascular: regular rate and rhythm.   PHQ 9 score 24,denies any suicidal ideations.She is agreeable for counseling but not Mental Health. Will start on lexapro and refer to [facility]. Face time 15 minutes with 50% counseling.  Assessment:     Depression     Plan:     Rx lexapro 20 mg #30 take 1 daily with 6 refills Follow up in 3 weeks Try in increase walking, watch carbs and try pantie girdle, take time for self  Referred to [facility]

## 2016-03-26 NOTE — Patient Instructions (Signed)
Start lexapro today, and take daily Follow up in 3 weeks Refer to [facility]

## 2016-03-29 ENCOUNTER — Encounter (HOSPITAL_COMMUNITY): Payer: Self-pay | Admitting: Emergency Medicine

## 2016-03-29 ENCOUNTER — Emergency Department (HOSPITAL_COMMUNITY): Payer: Medicaid Other

## 2016-03-29 ENCOUNTER — Emergency Department (HOSPITAL_COMMUNITY)
Admission: EM | Admit: 2016-03-29 | Discharge: 2016-03-29 | Disposition: A | Discharge status: Home / Self Care | Payer: Medicaid Other | Attending: Emergency Medicine | Admitting: Emergency Medicine

## 2016-03-29 ENCOUNTER — Other Ambulatory Visit: Payer: Self-pay

## 2016-03-29 DIAGNOSIS — I110 Hypertensive heart disease with heart failure: Secondary | ICD-10-CM | POA: Insufficient documentation

## 2016-03-29 DIAGNOSIS — I509 Heart failure, unspecified: Secondary | ICD-10-CM | POA: Diagnosis not present

## 2016-03-29 DIAGNOSIS — E119 Type 2 diabetes mellitus without complications: Secondary | ICD-10-CM | POA: Insufficient documentation

## 2016-03-29 DIAGNOSIS — R609 Edema, unspecified: Secondary | ICD-10-CM

## 2016-03-29 DIAGNOSIS — Z79899 Other long term (current) drug therapy: Secondary | ICD-10-CM | POA: Diagnosis not present

## 2016-03-29 DIAGNOSIS — I5022 Chronic systolic (congestive) heart failure: Secondary | ICD-10-CM | POA: Diagnosis not present

## 2016-03-29 DIAGNOSIS — R0602 Shortness of breath: Secondary | ICD-10-CM | POA: Diagnosis present

## 2016-03-29 DIAGNOSIS — Z7982 Long term (current) use of aspirin: Secondary | ICD-10-CM | POA: Diagnosis not present

## 2016-03-29 DIAGNOSIS — Z7984 Long term (current) use of oral hypoglycemic drugs: Secondary | ICD-10-CM | POA: Insufficient documentation

## 2016-03-29 LAB — CBC WITH DIFFERENTIAL/PLATELET
Basophils Absolute: 0 10*3/uL (ref 0.0–0.1)
Basophils Relative: 0 %
Eosinophils Absolute: 0.1 10*3/uL (ref 0.0–0.7)
Eosinophils Relative: 1 %
HEMATOCRIT: 31.2 % — AB (ref 36.0–46.0)
HEMOGLOBIN: 9.4 g/dL — AB (ref 12.0–15.0)
LYMPHS ABS: 2.3 10*3/uL (ref 0.7–4.0)
LYMPHS PCT: 22 %
MCH: 21.3 pg — AB (ref 26.0–34.0)
MCHC: 30.1 g/dL (ref 30.0–36.0)
MCV: 70.7 fL — AB (ref 78.0–100.0)
Monocytes Absolute: 0.4 10*3/uL (ref 0.1–1.0)
Monocytes Relative: 4 %
NEUTROS PCT: 73 %
Neutro Abs: 7.5 10*3/uL (ref 1.7–7.7)
Platelets: 260 10*3/uL (ref 150–400)
RBC: 4.41 MIL/uL (ref 3.87–5.11)
RDW: 17 % — ABNORMAL HIGH (ref 11.5–15.5)
WBC: 10.3 10*3/uL (ref 4.0–10.5)

## 2016-03-29 LAB — COMPREHENSIVE METABOLIC PANEL
ALK PHOS: 77 U/L (ref 38–126)
ALT: 32 U/L (ref 14–54)
AST: 35 U/L (ref 15–41)
Albumin: 2.8 g/dL — ABNORMAL LOW (ref 3.5–5.0)
Anion gap: 6 (ref 5–15)
BILIRUBIN TOTAL: 0.5 mg/dL (ref 0.3–1.2)
BUN: 18 mg/dL (ref 6–20)
CALCIUM: 8.4 mg/dL — AB (ref 8.9–10.3)
CO2: 29 mmol/L (ref 22–32)
CREATININE: 0.78 mg/dL (ref 0.44–1.00)
Chloride: 100 mmol/L — ABNORMAL LOW (ref 101–111)
Glucose, Bld: 276 mg/dL — ABNORMAL HIGH (ref 65–99)
Potassium: 3.8 mmol/L (ref 3.5–5.1)
SODIUM: 135 mmol/L (ref 135–145)
TOTAL PROTEIN: 6.3 g/dL — AB (ref 6.5–8.1)

## 2016-03-29 LAB — TROPONIN I

## 2016-03-29 LAB — BRAIN NATRIURETIC PEPTIDE: B Natriuretic Peptide: 1173 pg/mL — ABNORMAL HIGH (ref 0.0–100.0)

## 2016-03-29 MED ORDER — FUROSEMIDE 40 MG PO TABS: 40.0000 mg | ORAL_TABLET | Freq: Every day | ORAL | 2 refills | Status: DC

## 2016-03-29 MED ORDER — FUROSEMIDE 10 MG/ML IJ SOLN
40.0000 mg | Freq: Once | INTRAMUSCULAR | Status: AC
  Administered 2016-03-29: 40 mg via INTRAVENOUS
  Filled 2016-03-29: qty 4

## 2016-03-29 NOTE — Discharge Instructions (Signed)
Stop torsemide.   Start taking Lasix 40mg  a day.

## 2016-03-29 NOTE — ED Triage Notes (Signed)
Pt has CHF, states weight gain has gotten worse this weekend. Edema bilaterally

## 2016-03-29 NOTE — ED Provider Notes (Signed)
[facility] DEPT Provider Note   CSN: [id number] Arrival date & time: 03/29/16  [id number]     History   Chief Complaint Chief Complaint  Patient presents with  . Leg Swelling    HPI Chelsey Santiago is a 40 y.o. female.  The history is provided by the patient. No language interpreter was used.  Shortness of Breath  This is a new problem. The problem occurs continuously.The current episode started more than 2 days ago. The problem has been gradually worsening. Pertinent negatives include no fever. She has tried nothing for the symptoms. The treatment provided no relief. She has had prior hospitalizations. She has had prior ED visits. Associated medical issues include heart failure.  Pt complain of her leg swelling and feeling short of breath  Past Medical History:  Diagnosis Date  . Anemia    H&H of 10.6/33 and 07/2008 and 11.9/35 and 09/2010  . Anxiety   . CHF (congestive heart failure) ([place])   . Depression with anxiety   . Diabetes mellitus without complication ([place])   . Enlarged heart   . Fasting hyperglycemia   . Hypertension    Lab: Normal BMet except glucose of 118 in 09/2010  . Hypertensive heart disease 2009   Pulmonary edema postpartum; mild to moderate mitral regurgitation when hospitalized for CHF in 2009; Echocardiogram in 12/2009-no MR and normal EF; normal CXR in 09/2010  . Migraine headache   . Miscarriage 03/19/2013  . Obesity 04/16/2009  . Osteoarthritis, knee 03/29/2011  . Preeclampsia   . Pregnant   . Pulmonary edema   . Sleep apnea   . Threatened abortion in early pregnancy 03/15/2013    Patient Active Problem List   Diagnosis Date Noted  . 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 Normal coronary arteries 12/22/2015  . Troponin level elevated 12/22/2015  . NSTEMI (non-ST elevated myocardial infarction) ([place]) 12/20/2015  . Dental infection 10/10/2015  . Chest pain 09/05/2015  . Systolic CHF, chronic ([place]) 09/05/2015  . LLQ pain   . DM  type 2 (diabetes mellitus, type 2) ([place]) 04/28/2014  . Essential hypertension   . 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 Hypertensive urgency 04/22/2014  . Acute CHF ([place]) 04/22/2014  . S/P cesarean section 04/11/2014  . Pulmonary edema 04/11/2014  . Postoperative anemia 04/11/2014  . Elevated serum creatinine 04/11/2014  . Preeclampsia, severe 04/09/2014  . Pre-eclampsia superimposed on chronic hypertension, antepartum 04/08/2014  . Dyspnea 04/08/2014  . Polyhydramnios in third trimester, antepartum 03/14/2014  . Abnormal maternal glucose tolerance, antepartum 03/11/2014  . High-risk pregnancy 03/11/2014  . Chronic hypertension in pregnancy 03/11/2014  . Impaired glucose tolerance during pregnancy, antepartum 11/27/2013  . Fibroids 11/22/2013  . History of gestational diabetes in prior pregnancy, currently pregnant in first trimester 11/22/2013  . Hx of preeclampsia, prior pregnancy, currently pregnant 11/22/2013  . Short interval between pregnancies affecting pregnancy, antepartum 11/22/2013  . Supervision of high-risk pregnancy of elderly primigravida (>= 81 years old at delivery), third trimester 11/22/2013  . Miscarriage 03/19/2013  . 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 Hypertension   . Hypertensive cardiovascular disease   . Microcytic anemia   . Osteoarthritis, knee 03/29/2011  . Sleep apnea 12/09/2009  . Obesity 04/16/2009    Past Surgical History:  Procedure Laterality Date  . BREAST REDUCTION SURGERY  2002  . CARDIAC CATHETERIZATION N/A 12/22/2015   Procedure: Left Heart Cath and Coronary Angiography;  Surgeon: Peter M Martinique, MD;  Location: [facility];  Service: Cardiovascular;  Laterality:  N/A;  . CESAREAN SECTION N/A 04/09/2014   Procedure: CESAREAN SECTION;  Surgeon: Mora Bellman, MD;  Location: [facility];  Service: Obstetrics;  Laterality: N/A;  . CHOLECYSTECTOMY      OB History    Gravida Para Term Preterm AB Living   11 6 5 1 5 6    SAB TAB Ectopic Multiple  Live Births   3 2   0 6       Home Medications    Prior to Admission medications   Medication Sig Start Date End Date Taking? Authorizing Provider  amLODipine (NORVASC) 5 MG tablet Take 2 tablets (10 mg total) by mouth daily. 02/24/15  Yes Lendon Colonel, NP  aspirin EC 81 MG tablet Take 81 mg by mouth daily.   Yes Historical Provider, MD  carvedilol (COREG) 12.5 MG tablet TAKE 3 TABLETS BY MOUTH TWICE DAILY WITH A MEAL. 08/21/15  Yes Arnoldo Lenis, MD  escitalopram (LEXAPRO) 20 MG tablet Take 1 tablet (20 mg total) by mouth daily. 03/26/16  Yes Estill Dooms, NP  isosorbide-hydrALAZINE (BIDIL) 20-37.5 MG tablet Take 1 tablet by mouth 3 (three) times daily.   Yes Historical Provider, MD  metFORMIN (GLUCOPHAGE) 500 MG tablet Take 1 tablet (500 mg total) by mouth 2 (two) times daily with a meal. 12/25/15  Yes Doreene Burke Kilroy, PA-C  potassium chloride SA (K-DUR,KLOR-CON) 20 MEQ tablet Take 2 tablets (40 mEq total) by mouth daily. 03/27/15  Yes Lendon Colonel, NP  pregabalin (LYRICA) 75 MG capsule Take 75 mg by mouth daily.   Yes Historical Provider, MD  sacubitril-valsartan (ENTRESTO) 49-51 MG Take 1 tablet by mouth 2 (two) times daily. 09/05/15  Yes Kathie Dike, MD  sitaGLIPtin (JANUVIA) 100 MG tablet Take 100 mg by mouth daily.   Yes Historical Provider, MD  spironolactone (ALDACTONE) 25 MG tablet Take 1 tablet (25 mg total) by mouth daily. 02/24/15  Yes Lendon Colonel, NP  torsemide (DEMADEX) 20 MG tablet Take 1 tablet (20 mg total) by mouth daily. 02/24/15  Yes Lendon Colonel, NP  nitroGLYCERIN (NITROSTAT) 0.4 MG SL tablet Place 1 tablet (0.4 mg total) under the tongue every 5 (five) minutes as needed for chest pain. 09/05/15   Kathie Dike, MD  ondansetron (ZOFRAN-ODT) 8 MG disintegrating tablet Take 1 tablet (8 mg total) by mouth every 8 (eight) hours as needed for nausea or vomiting. 10/07/15   Kalman Drape, PA  promethazine (PHENERGAN) 12.5 MG tablet Take 1  tablet (12.5 mg total) by mouth every 6 (six) hours as needed for nausea or vomiting. 01/02/16   Lily Kocher, PA-C    Family History Family History  Problem Relation Age of Onset  . Diabetes Mother   . Heart disease Mother   . Hyperlipidemia Paternal Grandfather   . Hypertension Paternal Grandfather   . Heart disease Father   . Heart disease Maternal Grandmother   . ADD / ADHD Son   . Hypertension Maternal Uncle   . Heart attack Brother   . Sudden death Neg Hx     Social History Social History  Substance Use Topics  . Smoking status: Never Smoker  . Smokeless tobacco: Never Used  . Alcohol use Yes     Comment: occ     Allergies   Diclofenac; Tramadol; and Vicodin [hydrocodone-acetaminophen]   Review of Systems Review of Systems  Constitutional: Negative for fever.  Respiratory: Positive for shortness of breath.   All other systems reviewed and are negative.  Physical Exam Updated Vital Signs BP (!) 165/116   Pulse 89   Temp 98.1 F (36.7 C) (Oral)   Resp 18   Ht 5\' 6"  (1.676 m)   Wt 114.3 kg   LMP 03/05/2016 (Approximate)   SpO2 98%   BMI 40.67 kg/m   Physical Exam  Constitutional: She is oriented to person, place, and time. She appears well-developed and well-nourished.  HENT:  Head: Normocephalic.  Mouth/Throat: Oropharynx is clear and moist.  Eyes: EOM are normal.  Neck: Normal range of motion.  Cardiovascular: Normal rate, regular rhythm and normal heart sounds.   Pulmonary/Chest: Effort normal and breath sounds normal.  Abdominal: Soft. She exhibits no distension.  Musculoskeletal: Normal range of motion. She exhibits edema.  2 plus edema no pitting  Neurological: She is alert and oriented to person, place, and time.  Skin: Skin is warm.  Psychiatric: She has a normal mood and affect.  Nursing note and vitals reviewed.    ED Treatments / Results  Labs (all labs ordered are listed, but only abnormal results are displayed) Labs  Reviewed  CBC WITH DIFFERENTIAL/PLATELET - Abnormal; Notable for the following:       Result Value   Hemoglobin 9.4 (*)    HCT 31.2 (*)    MCV 70.7 (*)    MCH 21.3 (*)    RDW 17.0 (*)    All other components within normal limits  COMPREHENSIVE METABOLIC PANEL - Abnormal; Notable for the following:    Chloride 100 (*)    Glucose, Bld 276 (*)    Calcium 8.4 (*)    Total Protein 6.3 (*)    Albumin 2.8 (*)    All other components within normal limits  BRAIN NATRIURETIC PEPTIDE - Abnormal; Notable for the following:    B Natriuretic Peptide 1,173.0 (*)    All other components within normal limits  TROPONIN I    EKG  EKG Interpretation  Date/Time:  Monday March 29 2016 09:47:18 EST Ventricular Rate:  87 PR Interval:    QRS Duration: 93 QT Interval:  420 QTC Calculation: 506 R Axis:   38 Text Interpretation:  Sinus rhythm Consider left ventricular hypertrophy Nonspecific T abnormalities, lateral leads Borderline prolonged QT interval No significant change since 01/24/2016 Confirmed by DELO  MD, DOUGLAS ([id number]) on 03/29/2016 10:01:30 AM       Radiology Dg Chest 2 View  Result Date: 03/29/2016 CLINICAL DATA:  Leg swelling since Thursday. EXAM: CHEST  2 VIEW COMPARISON:  01/24/2016 FINDINGS: Midline trachea. Moderate cardiomegaly. No pleural effusion or pneumothorax. Mild pulmonary venous congestion without overt congestive failure. No lobar consolidation. IMPRESSION: Cardiomegaly with mild pulmonary venous congestion. Electronically Signed   By: Abigail Miyamoto M.D.   On: 03/29/2016 11:42    Procedures Procedures (including critical care time)  Medications Ordered in ED Medications  furosemide (LASIX) injection 40 mg (40 mg Intravenous Given 03/29/16 1244)   Pt given Iv lasix.  Pt had 1500 cc of urine output.  Pt reports she feels some better.   Pt does not feel well enough to go home.    Initial Impression / Assessment and Plan / ED Course  I have reviewed the triage vital  signs and the nursing notes.  Pertinent labs & imaging results that were available during my care of the patient were reviewed by me and considered in my medical decision making (see chart for details).  Clinical Course     Hospitalist evaluated pt.  He does not feel  pt needs admission.  Pt is much improved.  Pt given rx for lasix.  She is scheduled to follow up with cardiology for recheck  Final Clinical Impressions(s) / ED Diagnoses   Final diagnoses:  Peripheral edema  Acute congestive heart failure, unspecified congestive heart failure type [facility])    New Prescriptions Discharge Medication List as of 03/29/2016  3:24 PM    An After Visit Summary was printed and given to the patient. Meds ordered this encounter  Medications  . furosemide (LASIX) injection 40 mg  . furosemide (LASIX) 40 MG tablet    Sig: Take 1 tablet (40 mg total) by mouth daily.    Dispense:  30 tablet    Refill:  2    Order Specific Question:   Supervising Provider    Answer:   Noemi Chapel [place], PA-C 03/30/16 [place], MD 03/30/16 [phone]

## 2016-03-29 NOTE — Consult Note (Signed)
Medical Consultation   Chelsey Santiago  [id number]  DOB: 07-03-1975  DOA: 03/29/2016  PCP: Rosita Fire, MD    Requesting ED personnel: Caryl Ada  Reason for consultation: Lower extremity edema in patient with history of systolic HF  History of Present Illness: Chelsey Santiago is an 40 y.o. female with history of systolic HF (pregnancy induced) presenting initially to the ED with complaints of sob and LE leg edema. The SOB has resolved after IV lasix administration with 1.5 L of fluid output. Pt reports that she does not feel like the demadex she is on is working for her.  Otherwise denies any productive cough or fevers. The problem initially was persistent and gradually getting worse until she received the lasix in the ED.  No patient denies any sob.   Review of Systems:  ROS As per HPI otherwise 10 point review of systems negative.    Past Medical History: Past Medical History:  Diagnosis Date  . Anemia    H&H of 10.6/33 and 07/2008 and 11.9/35 and 09/2010  . Anxiety   . CHF (congestive heart failure) ([place])   . Depression with anxiety   . Diabetes mellitus without complication ([place])   . Enlarged heart   . Fasting hyperglycemia   . Hypertension    Lab: Normal BMet except glucose of 118 in 09/2010  . Hypertensive heart disease 2009   Pulmonary edema postpartum; mild to moderate mitral regurgitation when hospitalized for CHF in 2009; Echocardiogram in 12/2009-no MR and normal EF; normal CXR in 09/2010  . Migraine headache   . Miscarriage 03/19/2013  . Obesity 04/16/2009  . Osteoarthritis, knee 03/29/2011  . Preeclampsia   . Pregnant   . Pulmonary edema   . Sleep apnea   . Threatened abortion in early pregnancy 03/15/2013    Past Surgical History: Past Surgical History:  Procedure Laterality Date  . BREAST REDUCTION SURGERY  2002  . CARDIAC CATHETERIZATION N/A 12/22/2015   Procedure: Left Heart Cath and Coronary Angiography;  Surgeon: Peter M  Martinique, MD;  Location: [facility];  Service: Cardiovascular;  Laterality: N/A;  . CESAREAN SECTION N/A 04/09/2014   Procedure: CESAREAN SECTION;  Surgeon: Mora Bellman, MD;  Location: [facility];  Service: Obstetrics;  Laterality: N/A;  . CHOLECYSTECTOMY     Allergies:   Allergies  Allergen Reactions  . Diclofenac     Edema  . Tramadol Nausea And Vomiting  . Vicodin [Hydrocodone-Acetaminophen] Nausea Only   Social History:  reports that she has never smoked. She has never used smokeless tobacco. She reports that she drinks alcohol. She reports that she does not use drugs.   Family History: Family History  Problem Relation Age of Onset  . Diabetes Mother   . Heart disease Mother   . Hyperlipidemia Paternal Grandfather   . Hypertension Paternal Grandfather   . Heart disease Father   . Heart disease Maternal Grandmother   . ADD / ADHD Son   . Hypertension Maternal Uncle   . Heart attack Brother   . Sudden death Neg Hx      Physical Exam: Vitals:   03/29/16 1145 03/29/16 1200 03/29/16 1243 03/29/16 1409  BP:  148/99 (!) 156/111 (!) 165/116  Pulse: 88  89 89  Resp: 16 26 21 18   Temp:      TempSrc:      SpO2: 99%  100% 98%  Weight:  Height:        Constitutional: Appearance,  Alert and awake, oriented x3, not in any acute distress. Eyes: PERLA, EOMI, irises appear normal, anicteric sclera,  ENMT: external ears and nose appear normal, normal hearing or hard of hearing            Lips appears normal, oropharynx mucosa, tongue, posterior pharynx appear normal  Neck: neck appears normal, no masses, normal ROM, no thyromegaly, no JVD  CVS: S1-S2 clear, no murmur rubs or gallops, + LE edema, normal pedal pulses  Respiratory:  clear to auscultation bilaterally, no wheezing, rales or rhonchi. Respiratory effort normal. No accessory muscle use.  Abdomen: soft nontender, nondistended, normal bowel sounds, no hepatosplenomegaly, no hernias , difficult exam due to  obesity Musculoskeletal: : no cyanosis, clubbing or edema noted bilaterally                       Joint/bones/muscle exam, strength, contractures or atrophy Neuro:  Strength equal, sensation intact, no facial asymmetry Psych: judgement and insight appear normal, stable mood and affect, mental status Skin: no rashes or lesions or ulcers, no induration or nodules on limited exam.  Data reviewed:  I have personally reviewed following labs and imaging studies Labs:  CBC:  Recent Labs Lab 03/29/16 1118  WBC 10.3  NEUTROABS 7.5  HGB 9.4*  HCT 31.2*  MCV 70.7*  PLT 123456    Basic Metabolic Panel:  Recent Labs Lab 03/29/16 1118  NA 135  K 3.8  CL 100*  CO2 29  GLUCOSE 276*  BUN 18  CREATININE 0.78  CALCIUM 8.4*   GFR Estimated Creatinine Clearance: 120 mL/min (by C-G formula based on SCr of 0.78 mg/dL). Liver Function Tests:  Recent Labs Lab 03/29/16 1118  AST 35  ALT 32  ALKPHOS 77  BILITOT 0.5  PROT 6.3*  ALBUMIN 2.8*   No results for input(s): LIPASE, AMYLASE in the last 168 hours. No results for input(s): AMMONIA in the last 168 hours. Coagulation profile No results for input(s): INR, PROTIME in the last 168 hours.  Cardiac Enzymes:  Recent Labs Lab 03/29/16 1118  TROPONINI <0.03   BNP: Invalid input(s): POCBNP CBG: No results for input(s): GLUCAP in the last 168 hours. D-Dimer No results for input(s): DDIMER in the last 72 hours. Hgb A1c No results for input(s): HGBA1C in the last 72 hours. Lipid Profile No results for input(s): CHOL, HDL, LDLCALC, TRIG, CHOLHDL, LDLDIRECT in the last 72 hours. Thyroid function studies No results for input(s): TSH, T4TOTAL, T3FREE, THYROIDAB in the last 72 hours.  Invalid input(s): FREET3 Anemia work up No results for input(s): VITAMINB12, FOLATE, FERRITIN, TIBC, IRON, RETICCTPCT in the last 72 hours. Urinalysis    Component Value Date/Time   COLORURINE YELLOW 12/08/2015 0045   APPEARANCEUR CLOUDY (A)  12/08/2015 0045   LABSPEC 1.029 12/08/2015 0045   PHURINE 5.5 12/08/2015 0045   GLUCOSEU 100 (A) 12/08/2015 0045   HGBUR NEGATIVE 12/08/2015 0045   BILIRUBINUR NEGATIVE 12/08/2015 0045   KETONESUR NEGATIVE 12/08/2015 0045   PROTEINUR 100 (A) 12/08/2015 0045   UROBILINOGEN 0.2 04/29/2014 1115   NITRITE NEGATIVE 12/08/2015 0045   LEUKOCYTESUR NEGATIVE 12/08/2015 0045     Microbiology No results found for this or any previous visit (from the past 240 hour(s)).   Inpatient Medications:   Scheduled Meds: Continuous Infusions:   Radiological Exams on Admission: Dg Chest 2 View  Result Date: 03/29/2016 CLINICAL DATA:  Leg swelling since Thursday. EXAM: CHEST  2 VIEW COMPARISON:  01/24/2016 FINDINGS: Midline trachea. Moderate cardiomegaly. No pleural effusion or pneumothorax. Mild pulmonary venous congestion without overt congestive failure. No lobar consolidation. IMPRESSION: Cardiomegaly with mild pulmonary venous congestion. Electronically Signed   By: Abigail Miyamoto M.D.   On: 03/29/2016 11:42    Impression/Recommendations Active Problems:   Systolic CHF, chronic ([place]) - Patient does not think demadex works for her - recommend discontinuing demadex and starting patient on lasix 40 mg po daily - also recommended having patient f/u with cardiologist within the next 1 week.  Thank you for this consultation.     Time Spent: > 25 minutes  Velvet Bathe M.D. [facility] 03/29/2016, 3:25 PM

## 2016-04-02 ENCOUNTER — Telehealth: Payer: Self-pay | Admitting: Adult Health

## 2016-04-02 NOTE — Telephone Encounter (Signed)
Advised to see pcp for refills of zofran, made an apt in December to pcp.She will call them regarding refill.She states blood sugars are normal

## 2016-04-02 NOTE — Telephone Encounter (Signed)
Patient wants to know if she can be prescribed something for nausea. / tg

## 2016-04-03 ENCOUNTER — Encounter (HOSPITAL_COMMUNITY): Payer: Self-pay | Admitting: Emergency Medicine

## 2016-04-03 ENCOUNTER — Inpatient Hospital Stay (HOSPITAL_COMMUNITY)
Admission: EM | Admit: 2016-04-03 | Discharge: 2016-04-09 | DRG: 292 | Disposition: A | Discharge status: Home Health | Payer: Medicaid Other | Attending: Internal Medicine | Admitting: Internal Medicine

## 2016-04-03 ENCOUNTER — Encounter (HOSPITAL_COMMUNITY): Payer: Self-pay | Admitting: *Deleted

## 2016-04-03 ENCOUNTER — Emergency Department (HOSPITAL_COMMUNITY): Payer: Medicaid Other

## 2016-04-03 ENCOUNTER — Inpatient Hospital Stay (HOSPITAL_COMMUNITY)
Admission: EM | Admit: 2016-04-03 | Discharge: 2016-04-03 | Disposition: A | Discharge status: Home Health | Payer: Medicaid Other | Source: Home / Self Care | Attending: Internal Medicine | Admitting: Internal Medicine

## 2016-04-03 DIAGNOSIS — K529 Noninfective gastroenteritis and colitis, unspecified: Secondary | ICD-10-CM | POA: Diagnosis present

## 2016-04-03 DIAGNOSIS — I5023 Acute on chronic systolic (congestive) heart failure: Secondary | ICD-10-CM | POA: Diagnosis present

## 2016-04-03 DIAGNOSIS — G4733 Obstructive sleep apnea (adult) (pediatric): Secondary | ICD-10-CM

## 2016-04-03 DIAGNOSIS — E875 Hyperkalemia: Secondary | ICD-10-CM

## 2016-04-03 DIAGNOSIS — Z79899 Other long term (current) drug therapy: Secondary | ICD-10-CM | POA: Diagnosis not present

## 2016-04-03 DIAGNOSIS — D509 Iron deficiency anemia, unspecified: Secondary | ICD-10-CM | POA: Diagnosis present

## 2016-04-03 DIAGNOSIS — Z8249 Family history of ischemic heart disease and other diseases of the circulatory system: Secondary | ICD-10-CM

## 2016-04-03 DIAGNOSIS — I119 Hypertensive heart disease without heart failure: Secondary | ICD-10-CM | POA: Diagnosis present

## 2016-04-03 DIAGNOSIS — Z6841 Body Mass Index (BMI) 40.0 and over, adult: Secondary | ICD-10-CM

## 2016-04-03 DIAGNOSIS — I5043 Acute on chronic combined systolic (congestive) and diastolic (congestive) heart failure: Secondary | ICD-10-CM | POA: Diagnosis present

## 2016-04-03 DIAGNOSIS — I110 Hypertensive heart disease with heart failure: Principal | ICD-10-CM

## 2016-04-03 DIAGNOSIS — T501X5A Adverse effect of loop [high-ceiling] diuretics, initial encounter: Secondary | ICD-10-CM

## 2016-04-03 DIAGNOSIS — I161 Hypertensive emergency: Secondary | ICD-10-CM

## 2016-04-03 DIAGNOSIS — E119 Type 2 diabetes mellitus without complications: Secondary | ICD-10-CM

## 2016-04-03 DIAGNOSIS — E1165 Type 2 diabetes mellitus with hyperglycemia: Secondary | ICD-10-CM

## 2016-04-03 DIAGNOSIS — I428 Other cardiomyopathies: Secondary | ICD-10-CM | POA: Diagnosis not present

## 2016-04-03 DIAGNOSIS — Y92230 Patient room in hospital as the place of occurrence of the external cause: Secondary | ICD-10-CM | POA: Diagnosis present

## 2016-04-03 DIAGNOSIS — Z833 Family history of diabetes mellitus: Secondary | ICD-10-CM

## 2016-04-03 DIAGNOSIS — Z7984 Long term (current) use of oral hypoglycemic drugs: Secondary | ICD-10-CM | POA: Diagnosis not present

## 2016-04-03 DIAGNOSIS — F329 Major depressive disorder, single episode, unspecified: Secondary | ICD-10-CM | POA: Diagnosis present

## 2016-04-03 DIAGNOSIS — I10 Essential (primary) hypertension: Secondary | ICD-10-CM | POA: Diagnosis present

## 2016-04-03 DIAGNOSIS — N179 Acute kidney failure, unspecified: Secondary | ICD-10-CM | POA: Diagnosis present

## 2016-04-03 DIAGNOSIS — I429 Cardiomyopathy, unspecified: Secondary | ICD-10-CM | POA: Diagnosis not present

## 2016-04-03 DIAGNOSIS — J810 Acute pulmonary edema: Secondary | ICD-10-CM | POA: Diagnosis not present

## 2016-04-03 DIAGNOSIS — Z7982 Long term (current) use of aspirin: Secondary | ICD-10-CM | POA: Diagnosis not present

## 2016-04-03 DIAGNOSIS — I509 Heart failure, unspecified: Secondary | ICD-10-CM

## 2016-04-03 DIAGNOSIS — I43 Cardiomyopathy in diseases classified elsewhere: Secondary | ICD-10-CM

## 2016-04-03 LAB — I-STAT TROPONIN, ED: TROPONIN I, POC: 0.02 ng/mL (ref 0.00–0.08)

## 2016-04-03 LAB — BASIC METABOLIC PANEL
Anion gap: 9 (ref 5–15)
BUN: 14 mg/dL (ref 6–20)
CALCIUM: 8.9 mg/dL (ref 8.9–10.3)
CO2: 28 mmol/L (ref 22–32)
CREATININE: 1.11 mg/dL — AB (ref 0.44–1.00)
Chloride: 100 mmol/L — ABNORMAL LOW (ref 101–111)
Glucose, Bld: 296 mg/dL — ABNORMAL HIGH (ref 65–99)
Potassium: 3.7 mmol/L (ref 3.5–5.1)
SODIUM: 137 mmol/L (ref 135–145)

## 2016-04-03 LAB — TROPONIN I: Troponin I: <0.03 ng/mL (ref <0.03)

## 2016-04-03 LAB — COMPREHENSIVE METABOLIC PANEL
ALK PHOS: 78 U/L (ref 38–126)
ALT: 57 U/L — AB (ref 14–54)
AST: 56 U/L — AB (ref 15–41)
Albumin: 2.9 g/dL — ABNORMAL LOW (ref 3.5–5.0)
Anion gap: 7 (ref 5–15)
BUN: 18 mg/dL (ref 6–20)
CALCIUM: 8.8 mg/dL — AB (ref 8.9–10.3)
CHLORIDE: 103 mmol/L (ref 101–111)
CO2: 25 mmol/L (ref 22–32)
CREATININE: 0.95 mg/dL (ref 0.44–1.00)
GFR calc Af Amer: >60 mL/min (ref >60)
GFR calc non Af Amer: >60 mL/min (ref >60)
GLUCOSE: 300 mg/dL — AB (ref 65–99)
Potassium: 3.8 mmol/L (ref 3.5–5.1)
SODIUM: 135 mmol/L (ref 135–145)
Total Bilirubin: 0.4 mg/dL (ref 0.3–1.2)
Total Protein: 6.6 g/dL (ref 6.5–8.1)

## 2016-04-03 LAB — CBC
HCT: 33.2 % — ABNORMAL LOW (ref 36.0–46.0)
Hemoglobin: 10 g/dL — ABNORMAL LOW (ref 12.0–15.0)
MCH: 21 pg — ABNORMAL LOW (ref 26.0–34.0)
MCHC: 30.1 g/dL (ref 30.0–36.0)
MCV: 69.7 fL — ABNORMAL LOW (ref 78.0–100.0)
PLATELETS: 278 10*3/uL (ref 150–400)
RBC: 4.76 MIL/uL (ref 3.87–5.11)
RDW: 17.2 % — AB (ref 11.5–15.5)
WBC: 9.4 10*3/uL (ref 4.0–10.5)

## 2016-04-03 LAB — BRAIN NATRIURETIC PEPTIDE
B NATRIURETIC PEPTIDE 5: 2138.8 pg/mL — AB (ref 0.0–100.0)
B Natriuretic Peptide: 2047 pg/mL — ABNORMAL HIGH (ref 0.0–100.0)

## 2016-04-03 LAB — CBC WITH DIFFERENTIAL/PLATELET
Basophils Absolute: 0 10*3/uL (ref 0.0–0.1)
Basophils Relative: 0 %
EOS PCT: 1 %
Eosinophils Absolute: 0.1 10*3/uL (ref 0.0–0.7)
HCT: 32.2 % — ABNORMAL LOW (ref 36.0–46.0)
Hemoglobin: 9.6 g/dL — ABNORMAL LOW (ref 12.0–15.0)
LYMPHS ABS: 2.3 10*3/uL (ref 0.7–4.0)
LYMPHS PCT: 23 %
MCH: 21.1 pg — AB (ref 26.0–34.0)
MCHC: 29.8 g/dL — AB (ref 30.0–36.0)
MCV: 70.8 fL — AB (ref 78.0–100.0)
MONO ABS: 0.6 10*3/uL (ref 0.1–1.0)
MONOS PCT: 5 %
Neutro Abs: 7.2 10*3/uL (ref 1.7–7.7)
Neutrophils Relative %: 71 %
PLATELETS: 285 10*3/uL (ref 150–400)
RBC: 4.55 MIL/uL (ref 3.87–5.11)
RDW: 17.1 % — AB (ref 11.5–15.5)
WBC: 10.2 10*3/uL (ref 4.0–10.5)

## 2016-04-03 LAB — PROTIME-INR
INR: 1.09
PROTHROMBIN TIME: 14.2 s (ref 11.4–15.2)

## 2016-04-03 LAB — GLUCOSE, CAPILLARY: GLUCOSE-CAPILLARY: 260 mg/dL — AB (ref 65–99)

## 2016-04-03 LAB — TSH: TSH: 0.835 u[IU]/mL (ref 0.350–4.500)

## 2016-04-03 LAB — HCG, QUANTITATIVE, PREGNANCY

## 2016-04-03 MED ORDER — ESCITALOPRAM OXALATE 10 MG PO TABS
20.0000 mg | ORAL_TABLET | Freq: Every day | ORAL | Status: DC
  Administered 2016-04-03: 20 mg via ORAL
  Filled 2016-04-03 (×3): qty 1

## 2016-04-03 MED ORDER — CARVEDILOL 12.5 MG PO TABS: 12.5000 mg | ORAL_TABLET | Freq: Two times a day (BID) | ORAL | Status: DC

## 2016-04-03 MED ORDER — NITROGLYCERIN 0.4 MG SL SUBL: 0.4000 mg | SUBLINGUAL_TABLET | SUBLINGUAL | Status: DC | PRN

## 2016-04-03 MED ORDER — PREGABALIN 75 MG PO CAPS: 75.0000 mg | ORAL_CAPSULE | Freq: Every day | ORAL | Status: DC

## 2016-04-03 MED ORDER — HEPARIN SODIUM (PORCINE) 5000 UNIT/ML IJ SOLN
5000.0000 [IU] | Freq: Three times a day (TID) | INTRAMUSCULAR | Status: DC
  Administered 2016-04-03 – 2016-04-08 (×15): 5000 [IU] via SUBCUTANEOUS
  Filled 2016-04-03 (×15): qty 1

## 2016-04-03 MED ORDER — FUROSEMIDE 10 MG/ML IJ SOLN
80.0000 mg | Freq: Once | INTRAMUSCULAR | Status: AC
  Administered 2016-04-03: 80 mg via INTRAVENOUS
  Filled 2016-04-03: qty 8

## 2016-04-03 MED ORDER — ISOSORB DINITRATE-HYDRALAZINE 20-37.5 MG PO TABS
1.0000 | ORAL_TABLET | Freq: Three times a day (TID) | ORAL | Status: DC
  Filled 2016-04-03 (×6): qty 1

## 2016-04-03 MED ORDER — INFLUENZA VAC SPLIT QUAD 0.5 ML IM SUSY
0.5000 mL | PREFILLED_SYRINGE | INTRAMUSCULAR | Status: DC
  Filled 2016-04-03: qty 0.5

## 2016-04-03 MED ORDER — ASPIRIN EC 81 MG PO TBEC: 81.0000 mg | DELAYED_RELEASE_TABLET | Freq: Every day | ORAL | Status: DC

## 2016-04-03 MED ORDER — ACETAMINOPHEN 500 MG PO TABS
1000.0000 mg | ORAL_TABLET | Freq: Once | ORAL | Status: AC
  Administered 2016-04-03: 1000 mg via ORAL
  Filled 2016-04-03: qty 2

## 2016-04-03 MED ORDER — MAGNESIUM SULFATE IN D5W 1-5 GM/100ML-% IV SOLN: 1.0000 g | Freq: Once | INTRAVENOUS | Status: DC

## 2016-04-03 MED ORDER — CARVEDILOL 12.5 MG PO TABS
12.5000 mg | ORAL_TABLET | Freq: Two times a day (BID) | ORAL | Status: DC
  Administered 2016-04-04 – 2016-04-09 (×11): 12.5 mg via ORAL
  Filled 2016-04-03 (×11): qty 1

## 2016-04-03 MED ORDER — ONDANSETRON HCL 4 MG/2ML IJ SOLN
4.0000 mg | Freq: Once | INTRAMUSCULAR | Status: AC
  Administered 2016-04-03: 4 mg via INTRAVENOUS
  Filled 2016-04-03: qty 2

## 2016-04-03 MED ORDER — SACUBITRIL-VALSARTAN 49-51 MG PO TABS
1.0000 | ORAL_TABLET | Freq: Two times a day (BID) | ORAL | Status: DC
  Administered 2016-04-03: 1 via ORAL
  Filled 2016-04-03 (×5): qty 1

## 2016-04-03 MED ORDER — SODIUM CHLORIDE 0.9 % IV SOLN
INTRAVENOUS | Status: DC
  Administered 2016-04-03: 21:00:00 via INTRAVENOUS

## 2016-04-03 MED ORDER — ACETAMINOPHEN 325 MG PO TABS
650.0000 mg | ORAL_TABLET | Freq: Four times a day (QID) | ORAL | Status: DC | PRN
  Administered 2016-04-04 (×3): 650 mg via ORAL
  Filled 2016-04-03 (×4): qty 2

## 2016-04-03 MED ORDER — SPIRONOLACTONE 25 MG PO TABS
25.0000 mg | ORAL_TABLET | Freq: Every day | ORAL | Status: DC
  Administered 2016-04-03: 25 mg via ORAL
  Filled 2016-04-03 (×3): qty 1

## 2016-04-03 MED ORDER — SODIUM CHLORIDE 0.9 % IV SOLN: INTRAVENOUS | Status: DC

## 2016-04-03 MED ORDER — SPIRONOLACTONE 25 MG PO TABS
25.0000 mg | ORAL_TABLET | Freq: Every day | ORAL | Status: DC
  Administered 2016-04-04 – 2016-04-08 (×5): 25 mg via ORAL
  Filled 2016-04-03 (×5): qty 1

## 2016-04-03 MED ORDER — PREGABALIN 75 MG PO CAPS
75.0000 mg | ORAL_CAPSULE | Freq: Every day | ORAL | Status: DC
  Administered 2016-04-03 – 2016-04-08 (×6): 75 mg via ORAL
  Filled 2016-04-03 (×2): qty 3
  Filled 2016-04-03 (×4): qty 1

## 2016-04-03 MED ORDER — FUROSEMIDE 10 MG/ML IJ SOLN
40.0000 mg | Freq: Two times a day (BID) | INTRAMUSCULAR | Status: DC
  Administered 2016-04-03 – 2016-04-04 (×4): 40 mg via INTRAVENOUS
  Filled 2016-04-03 (×4): qty 4

## 2016-04-03 MED ORDER — SODIUM CHLORIDE 0.9% FLUSH
3.0000 mL | Freq: Two times a day (BID) | INTRAVENOUS | Status: DC
  Administered 2016-04-03 – 2016-04-07 (×6): 3 mL via INTRAVENOUS

## 2016-04-03 MED ORDER — ONDANSETRON HCL 4 MG PO TABS: 4.0000 mg | ORAL_TABLET | Freq: Four times a day (QID) | ORAL | Status: DC | PRN

## 2016-04-03 MED ORDER — NITROGLYCERIN IN D5W 200-5 MCG/ML-% IV SOLN: 5.0000 ug/min | INTRAVENOUS | Status: DC

## 2016-04-03 MED ORDER — HYDRALAZINE HCL 20 MG/ML IJ SOLN: 10.0000 mg | Freq: Four times a day (QID) | INTRAMUSCULAR | Status: DC | PRN

## 2016-04-03 MED ORDER — LINAGLIPTIN 5 MG PO TABS
5.0000 mg | ORAL_TABLET | Freq: Every day | ORAL | Status: DC
  Administered 2016-04-03: 5 mg via ORAL
  Filled 2016-04-03 (×3): qty 1

## 2016-04-03 MED ORDER — NITROGLYCERIN IN D5W 200-5 MCG/ML-% IV SOLN
5.0000 ug/min | Freq: Once | INTRAVENOUS | Status: AC
  Administered 2016-04-03: 5 ug/min via INTRAVENOUS
  Filled 2016-04-03: qty 250

## 2016-04-03 MED ORDER — AMLODIPINE BESYLATE 5 MG PO TABS: 10.0000 mg | ORAL_TABLET | Freq: Every day | ORAL | Status: DC

## 2016-04-03 MED ORDER — INSULIN ASPART 100 UNIT/ML ~~LOC~~ SOLN
0.0000 [IU] | Freq: Three times a day (TID) | SUBCUTANEOUS | Status: DC
  Administered 2016-04-04: 8 [IU] via SUBCUTANEOUS
  Administered 2016-04-04: 5 [IU] via SUBCUTANEOUS
  Administered 2016-04-04: 2 [IU] via SUBCUTANEOUS
  Administered 2016-04-05: 5 [IU] via SUBCUTANEOUS
  Administered 2016-04-05: 8 [IU] via SUBCUTANEOUS
  Administered 2016-04-05: 5 [IU] via SUBCUTANEOUS
  Administered 2016-04-06: 2 [IU] via SUBCUTANEOUS
  Administered 2016-04-06: 3 [IU] via SUBCUTANEOUS
  Administered 2016-04-06: 11 [IU] via SUBCUTANEOUS
  Administered 2016-04-07: 5 [IU] via SUBCUTANEOUS
  Administered 2016-04-07: 8 [IU] via SUBCUTANEOUS
  Administered 2016-04-07 – 2016-04-08 (×2): 5 [IU] via SUBCUTANEOUS
  Administered 2016-04-08: 3 [IU] via SUBCUTANEOUS
  Administered 2016-04-08 – 2016-04-09 (×2): 5 [IU] via SUBCUTANEOUS
  Administered 2016-04-09: 3 [IU] via SUBCUTANEOUS

## 2016-04-03 MED ORDER — NITROGLYCERIN IN D5W 200-5 MCG/ML-% IV SOLN
5.0000 ug/min | INTRAVENOUS | Status: DC
  Administered 2016-04-03: 5 ug/min via INTRAVENOUS
  Filled 2016-04-03: qty 250

## 2016-04-03 MED ORDER — ESCITALOPRAM OXALATE 10 MG PO TABS
20.0000 mg | ORAL_TABLET | Freq: Every day | ORAL | Status: DC
  Administered 2016-04-04 – 2016-04-09 (×6): 20 mg via ORAL
  Filled 2016-04-03 (×8): qty 2

## 2016-04-03 MED ORDER — ASPIRIN EC 81 MG PO TBEC
81.0000 mg | DELAYED_RELEASE_TABLET | Freq: Every day | ORAL | Status: DC
  Administered 2016-04-03 – 2016-04-09 (×7): 81 mg via ORAL
  Filled 2016-04-03 (×7): qty 1

## 2016-04-03 MED ORDER — ONDANSETRON HCL 4 MG/2ML IJ SOLN
4.0000 mg | Freq: Four times a day (QID) | INTRAMUSCULAR | Status: DC | PRN
  Administered 2016-04-04: 4 mg via INTRAVENOUS
  Filled 2016-04-03: qty 2

## 2016-04-03 MED ORDER — DIPHENHYDRAMINE HCL 25 MG PO CAPS
25.0000 mg | ORAL_CAPSULE | Freq: Four times a day (QID) | ORAL | Status: DC | PRN
  Administered 2016-04-03: 25 mg via ORAL
  Filled 2016-04-03: qty 1

## 2016-04-03 MED ORDER — HYDROCODONE-ACETAMINOPHEN 5-325 MG PO TABS
1.0000 | ORAL_TABLET | ORAL | Status: DC | PRN
  Administered 2016-04-03: 2 via ORAL
  Filled 2016-04-03: qty 2

## 2016-04-03 MED ORDER — ISOSORB DINITRATE-HYDRALAZINE 20-37.5 MG PO TABS
1.0000 | ORAL_TABLET | Freq: Three times a day (TID) | ORAL | Status: DC
  Administered 2016-04-03 – 2016-04-07 (×11): 1 via ORAL
  Filled 2016-04-03 (×11): qty 1

## 2016-04-03 MED ORDER — SACUBITRIL-VALSARTAN 49-51 MG PO TABS
1.0000 | ORAL_TABLET | Freq: Two times a day (BID) | ORAL | Status: DC
  Administered 2016-04-03 – 2016-04-09 (×12): 1 via ORAL
  Filled 2016-04-03 (×13): qty 1

## 2016-04-03 MED ORDER — POTASSIUM CHLORIDE CRYS ER 20 MEQ PO TBCR: 40.0000 meq | EXTENDED_RELEASE_TABLET | Freq: Every day | ORAL | Status: DC

## 2016-04-03 MED ORDER — ACETAMINOPHEN 650 MG RE SUPP: 650.0000 mg | Freq: Four times a day (QID) | RECTAL | Status: DC | PRN

## 2016-04-03 NOTE — ED Notes (Signed)
Pt states she is signing out AMA and her daughter is taking her to [facility]

## 2016-04-03 NOTE — ED Notes (Signed)
Return from CT

## 2016-04-03 NOTE — ED Notes (Signed)
Pt is requesting to be transferred to [facility]. States she is not about to stay up in here and die. States she brought her moma here for the same thing and she was dead the next day.

## 2016-04-03 NOTE — ED Notes (Signed)
Placed patient into a gown and on the monitor 

## 2016-04-03 NOTE — ED Notes (Signed)
Pt returned from X-ray.  

## 2016-04-03 NOTE — Progress Notes (Signed)
Pt complaining of a headache. Hydrocodone-acetaminophen (norco/Vicodin) 5-325Mg  was given per existing order from admitting Physician. Two Hydrocodone tablets were given at 2057 04/03/16. Pt notified RN of severe itching symptoms, with no complaints of SOB or angioedema. Tamala Julian MD notified Benadryl ordered and administered.

## 2016-04-03 NOTE — ED Provider Notes (Signed)
[facility] DEPT Provider Note   CSN: [id number] Arrival date & time: 04/03/16  1431     History   Chief Complaint Chief Complaint  Patient presents with  . Hypertension    HPI Chelsey Santiago is a 40 y.o. female.  Patient with past medical history of CHF, diabetes, hypertension, presents emergency department with chief complaint of shortness of breath and leg swelling. She reports that she has had the symptoms for the past 10 days. She was seen at [facility] several days ago, and treated with Lasix, and discharged home with instructions to elevate her legs. She states that her symptoms have since worsened. She reports worsening shortness of breath, especially with exertion. She denies any chest pain. She states that she went to [facility] again today, and was advised that she be admitted to the hospital. She states that she did not want to be admitted to the hospital there because her mother died in the hospital, and she does not trust them. She requests to be transferred to [facility], but ultimately left [facility] of her own free will and came to [facility] for further evaluation and care.  Since then, she denies any change in her symptoms.  Still has exertional SOB.  Her BP is still very high.   The history is provided by the patient. No language interpreter was used.    Past Medical History:  Diagnosis Date  . Anemia    H&H of 10.6/33 and 07/2008 and 11.9/35 and 09/2010  . Anxiety   . CHF (congestive heart failure) ([place])   . Depression with anxiety   . Diabetes mellitus without complication ([place])   . Enlarged heart   . Fasting hyperglycemia   . Hypertension    Lab: Normal BMet except glucose of 118 in 09/2010  . Hypertensive heart disease 2009   Pulmonary edema postpartum; mild to moderate mitral regurgitation when hospitalized for CHF in 2009; Echocardiogram in 12/2009-no MR and normal EF; normal CXR in 09/2010  . Migraine headache   . Miscarriage 03/19/2013    . Obesity 04/16/2009  . Osteoarthritis, knee 03/29/2011  . Preeclampsia   . Pregnant   . Pulmonary edema   . Sleep apnea   . Threatened abortion in early pregnancy 03/15/2013    Patient Active Problem List   Diagnosis Date Noted  . Acute on chronic combined systolic and diastolic ACC/AHA stage C congestive heart failure ([place]) 04/03/2016  . Acute on chronic combined systolic and diastolic CHF, NYHA class 4 ([place]) 04/03/2016  . Hypertensive emergency 04/03/2016  . Cardiomyopathy due to hypertension ([place]) 12/22/2015  . Normal coronary arteries 12/22/2015  . Troponin level elevated 12/22/2015  . NSTEMI (non-ST elevated myocardial infarction) ([place]) 12/20/2015  . Dental infection 10/10/2015  . Chest pain 09/05/2015  . Systolic CHF, chronic ([place]) 09/05/2015  . LLQ pain   . DM type 2 (diabetes mellitus, type 2) ([place]) 04/28/2014  . Essential hypertension   . 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 Hypertensive urgency 04/22/2014  . Acute CHF ([place]) 04/22/2014  . S/P cesarean section 04/11/2014  . Pulmonary edema 04/11/2014  . Postoperative anemia 04/11/2014  . Elevated serum creatinine 04/11/2014  . Preeclampsia, severe 04/09/2014  . Pre-eclampsia superimposed on chronic hypertension, antepartum 04/08/2014  . Dyspnea 04/08/2014  . Polyhydramnios in third trimester, antepartum 03/14/2014  . Abnormal maternal glucose tolerance, antepartum 03/11/2014  . High-risk pregnancy 03/11/2014  . Chronic hypertension in pregnancy 03/11/2014  . Impaired glucose tolerance during pregnancy, antepartum 11/27/2013  .  Fibroids 11/22/2013  . History of gestational diabetes in prior pregnancy, currently pregnant in first trimester 11/22/2013  . Hx of preeclampsia, prior pregnancy, currently pregnant 11/22/2013  . Short interval between pregnancies affecting pregnancy, antepartum 11/22/2013  . Supervision of high-risk pregnancy of elderly primigravida (>= 23 years old at delivery), third trimester 11/22/2013   . Miscarriage 03/19/2013  . 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 Hypertension   . Hypertensive cardiovascular disease   . Microcytic anemia   . Osteoarthritis, knee 03/29/2011  . Sleep apnea 12/09/2009  . Obesity 04/16/2009    Past Surgical History:  Procedure Laterality Date  . BREAST REDUCTION SURGERY  2002  . CARDIAC CATHETERIZATION N/A 12/22/2015   Procedure: Left Heart Cath and Coronary Angiography;  Surgeon: Peter M Martinique, MD;  Location: [place] [facility];  Service: Cardiovascular;  Laterality: N/A;  . CESAREAN SECTION N/A 04/09/2014   Procedure: CESAREAN SECTION;  Surgeon: Mora Bellman, MD;  Location: [facility];  Service: Obstetrics;  Laterality: N/A;  . CHOLECYSTECTOMY      OB History    Gravida Para Term Preterm AB Living   11 6 5 1 5 6    SAB TAB Ectopic Multiple Live Births   3 2   0 6       Home Medications    Prior to Admission medications   Medication Sig Start Date End Date Taking? Authorizing Provider  acetaminophen (TYLENOL) 500 MG tablet Take 1,000 mg by mouth every 6 (six) hours as needed.   Yes Historical Provider, MD  amLODipine (NORVASC) 5 MG tablet Take 2 tablets (10 mg total) by mouth daily. 02/24/15  Yes Lendon Colonel, NP  aspirin EC 81 MG tablet Take 81 mg by mouth daily.   Yes Historical Provider, MD  carvedilol (COREG) 12.5 MG tablet TAKE 3 TABLETS BY MOUTH TWICE DAILY WITH A MEAL. 08/21/15  Yes Arnoldo Lenis, MD  escitalopram (LEXAPRO) 20 MG tablet Take 1 tablet (20 mg total) by mouth daily. 03/26/16  Yes Estill Dooms, NP  furosemide (LASIX) 40 MG tablet Take 1 tablet (40 mg total) by mouth daily. 03/29/16  Yes Leslie K Sofia, PA-C  isosorbide-hydrALAZINE (BIDIL) 20-37.5 MG tablet Take 1 tablet by mouth 3 (three) times daily.   Yes Historical Provider, MD  linagliptin (TRADJENTA) 5 MG TABS tablet Take 5 mg by mouth daily.   Yes Historical Provider, MD  metFORMIN (GLUCOPHAGE) 500 MG tablet Take 1 tablet (500 mg total) by mouth 2 (two) times  daily with a meal. 12/25/15  Yes Erlene Quan, PA-C  nitroGLYCERIN (NITROSTAT) 0.4 MG SL tablet Place 1 tablet (0.4 mg total) under the tongue every 5 (five) minutes as needed for chest pain. 09/05/15  Yes Kathie Dike, MD  ondansetron (ZOFRAN-ODT) 8 MG disintegrating tablet Take 1 tablet (8 mg total) by mouth every 8 (eight) hours as needed for nausea or vomiting. 10/07/15  Yes Fraser Din Focht, PA  potassium chloride SA (K-DUR,KLOR-CON) 20 MEQ tablet Take 2 tablets (40 mEq total) by mouth daily. 03/27/15  Yes Lendon Colonel, NP  pregabalin (LYRICA) 75 MG capsule Take 75 mg by mouth daily.   Yes Historical Provider, MD  sacubitril-valsartan (ENTRESTO) 49-51 MG Take 1 tablet by mouth 2 (two) times daily. 09/05/15  Yes Kathie Dike, MD  sitaGLIPtin (JANUVIA) 100 MG tablet Take 100 mg by mouth daily.   Yes Historical Provider, MD  spironolactone (ALDACTONE) 25 MG tablet Take 1 tablet (25 mg total) by mouth daily. 02/24/15  Yes Curt Bears  Brooks Sailors, NP  promethazine (PHENERGAN) 12.5 MG tablet Take 1 tablet (12.5 mg total) by mouth every 6 (six) hours as needed for nausea or vomiting. Patient not taking: Reported on 04/03/2016 01/02/16   Lily Kocher, PA-C    Family History Family History  Problem Relation Age of Onset  . Diabetes Mother   . Heart disease Mother   . Hyperlipidemia Paternal Grandfather   . Hypertension Paternal Grandfather   . Heart disease Father   . Heart disease Maternal Grandmother   . ADD / ADHD Son   . Hypertension Maternal Uncle   . Heart attack Brother   . Sudden death Neg Hx     Social History Social History  Substance Use Topics  . Smoking status: Never Smoker  . Smokeless tobacco: Never Used  . Alcohol use Yes     Comment: occ     Allergies   Diclofenac; Tramadol; and Vicodin [hydrocodone-acetaminophen]   Review of Systems Review of Systems  Respiratory: Positive for shortness of breath.   Cardiovascular: Positive for leg swelling.  All other  systems reviewed and are negative.    Physical Exam Updated Vital Signs BP (!) 191/139   Pulse 100   Temp 97.5 F (36.4 C) (Oral)   Resp 18   Wt 113.8 kg   LMP 04/03/2016 (Exact Date)   SpO2 99%   BMI 40.48 kg/m   Physical Exam  Constitutional: She is oriented to person, place, and time. She appears well-developed and well-nourished.  HENT:  Head: Normocephalic and atraumatic.  Eyes: Conjunctivae and EOM are normal. Pupils are equal, round, and reactive to light.  Neck: Normal range of motion. Neck supple.  Cardiovascular: Normal rate and regular rhythm.  Exam reveals no gallop and no friction rub.   No murmur heard. Pulmonary/Chest: Effort normal. No respiratory distress. She has no wheezes. She has no rales. She exhibits no tenderness.  Diminished bilaterally  Abdominal: Soft. Bowel sounds are normal. She exhibits no distension and no mass. There is no tenderness. There is no rebound and no guarding.  Musculoskeletal: Normal range of motion. She exhibits edema. She exhibits no tenderness.  1+ pitting edema to the knees bilaterally  Neurological: She is alert and oriented to person, place, and time.  Skin: Skin is warm and dry.  Psychiatric: She has a normal mood and affect. Her behavior is normal. Judgment and thought content normal.  Nursing note and vitals reviewed.    ED Treatments / Results  Labs (all labs ordered are listed, but only abnormal results are displayed) Labs Reviewed  BASIC METABOLIC PANEL - Abnormal; Notable for the following:       Result Value   Chloride 100 (*)    Glucose, Bld 296 (*)    Creatinine, Ser 1.11 (*)    All other components within normal limits  CBC - Abnormal; Notable for the following:    Hemoglobin 10.0 (*)    HCT 33.2 (*)    MCV 69.7 (*)    MCH 21.0 (*)    RDW 17.2 (*)    All other components within normal limits  BRAIN NATRIURETIC PEPTIDE - Abnormal; Notable for the following:    B Natriuretic Peptide 2,138.8 (*)    All  other components within normal limits  I-STAT TROPOININ, ED    EKG  EKG Interpretation None       Radiology Dg Chest 2 View  Result Date: 04/03/2016 CLINICAL DATA:  Short of breath. CHF. Bilateral foot and ankle swelling.  EXAM: CHEST  2 VIEW COMPARISON:  03/29/2016 FINDINGS: The heart is moderately enlarged. Vascular congestion. There are no Kerley B-lines. Tiny pleural effusions are suspected. No pneumothorax. No pleural effusion. IMPRESSION: Cardiomegaly with vascular congestion and tiny pleural effusions but no interstitial edema. This is compatible with mild volume overload. Electronically Signed   By: Marybelle Killings M.D.   On: 04/03/2016 09:10   Ct Chest Wo Contrast  Result Date: 04/03/2016 CLINICAL DATA:  Shortness of breath and coughing for 1 week. Lower extremity edema. Abnormal chest radiograph. EXAM: CT CHEST WITHOUT CONTRAST TECHNIQUE: Multidetector CT imaging of the chest was performed following the standard protocol without IV contrast. COMPARISON:  Chest radiograph on 04/03/2016 FINDINGS: Cardiovascular: Mild to moderate cardiomegaly. No evidence of pericardial effusion. Mediastinum/Nodes: No masses or pathologically enlarged lymph nodes identified on this unenhanced exam. Lungs/Pleura: Mild heterogeneous ground-glass opacity is seen in both lungs with mild patchy airspace disease in both lower lobes. This appears symmetric. Small right pleural effusion also noted. No evidence of pulmonary mass. Upper Abdomen:  Unremarkable. Musculoskeletal: No suspicious bone lesions or other significant abnormality. IMPRESSION: Cardiomegaly and small right pleural effusion. Symmetric mild pulmonary ground-glass opacity and patchy airspace disease in both lower lobes, suspicious for pulmonary edema with pneumonia considered less likely. No mass or lymphadenopathy identified. Electronically Signed   By: Earle Gell M.D.   On: 04/03/2016 10:24    Procedures Procedures (including critical care  time)  Medications Ordered in ED Medications  nitroGLYCERIN 50 mg in dextrose 5 % 250 mL (0.2 mg/mL) infusion (not administered)     Initial Impression / Assessment and Plan / ED Course  I have reviewed the triage vital signs and the nursing notes.  Pertinent labs & imaging results that were available during my care of the patient were reviewed by me and considered in my medical decision making (see chart for details).  Clinical Course     Patient with CHF exacerbation in the setting of hypertensive emergency. Patient ultimately refused care at [facility], and came to [facility] for further treatment. She will be restarted on a nitroglycerin drip to hopefully control her blood pressure and reduce preload.    BNP markedly elevated.  CT chest shows pulmonary edema.  No fever.  Doubt pneumonia.  Discussed with Dr. Hartford Poli, who will admit the patient to stepdown.  Appreciate him for admitting the patient.  Medications  nitroGLYCERIN 50 mg in dextrose 5 % 250 mL (0.2 mg/mL) infusion (not administered)    CRITICAL CARE Performed by: Montine Circle   Total critical care time: 30 minutes  Critical care time was exclusive of separately billable procedures and treating other patients.  Critical care was necessary to treat or prevent imminent or life-threatening deterioration.  Critical care was time spent personally by me on the following activities: development of treatment plan with patient and/or surrogate as well as nursing, discussions with consultants, evaluation of patient's response to treatment, examination of patient, obtaining history from patient or surrogate, ordering and performing treatments and interventions, ordering and review of laboratory studies, ordering and review of radiographic studies, pulse oximetry and re-evaluation of patient's condition.   Final Clinical Impressions(s) / ED Diagnoses   Final diagnoses:  Hypertensive emergency  Acute pulmonary edema  [facility])    New Prescriptions New Prescriptions   No medications on file     Montine Circle, PA-C 04/03/16 [place], MD 04/03/16 [id number]

## 2016-04-03 NOTE — ED Notes (Signed)
Pt continue to have expiratory wheezes

## 2016-04-03 NOTE — ED Triage Notes (Signed)
Patient states nausea vomiting that started a week ago with cough. States was seen Monday for same.

## 2016-04-03 NOTE — ED Notes (Signed)
Pt family member, Roland Rack, called and requested pt be transferred to [facility] due to no family support in [place]. EDP consulted and reported has discussed plan of care with patient and patient verbalized understanding of admission at [facility]. This RN also discussed remaining at [facility] with patient and that there was no medical indication for transfer. Pt reported " If I can go to [facility] I want to."  Hospitalist paged. Discussed patient wishes with Hospitalist. Hospitalist reported no indication for transfer and if patient is adament about leaving pt can sign out AMA and go to [facility]. Pt aware and reported consulting family and would notify ED staff of decision. Primary RN aware.

## 2016-04-03 NOTE — Progress Notes (Signed)
MD notified about parameters for titrating nitroglycerin drip. It was explained per MD Tamala Julian that the preferred SBP be maintained in the 170s.

## 2016-04-03 NOTE — ED Notes (Signed)
Daughter here to transport pt to [facility].

## 2016-04-03 NOTE — ED Notes (Signed)
Pt request something for a headache. EDP aware

## 2016-04-03 NOTE — ED Triage Notes (Signed)
Pt reports just leaving AMA from [facility], was being admitted for CHF. Has leg swelling and HTN. Bp is 195/135 at triage. No resp distress noted at triage.

## 2016-04-03 NOTE — H&P (Signed)
History and Physical    Chelsey Santiago [id number] DOB: [birth year] DOA: 04/03/2016  Referring MD/NP/PA: Fredia Sorrow, MD. PCP: Rosita Fire, MD  Outpatient Specialists:   Cardiology: Yehuda Savannah, MD  Obstetrics and :Gynecology: Florian Buff, MD  Patient coming from: Home  Chief Complaint: Edema  HPI: Chelsey Santiago is a 40 y.o. female with medical history significant of HTN, OSA, Systolic CHF, depression, and DM type 2, presents to the ED with edema for the past few days. She has associated SOB for the past 6 days along with nausea and vomiting. Her SOB worsens while lying down and upon exertion. She has been having multiple episodes of emesis a day, she could not take her blood pressure medications today, she also had some atypical substernal stabbing chest pain with shortness of breath earlier which has now resolved.  Came to the ER for edema and shortness of breath along with nausea vomiting, she says that her nausea vomiting always comes up when she has CHF issues, in the ER she was diagnosed with acute on chronic combined systolic and diastolic CHF with elevated BNP, she was given Lasix and I was called to admit.   ED Course: While in the ED, she was given Zofran and lasix with much relief. She was noted to be hypertensive. LFTs are elevated. BNP 2,047, Hgb 9.6. CXR shows some vascular congestion. EKG shows sinus tachycardia. She was admitted for treatment of acute on chronic him mind systolic and diastolic CHF, she is currently relatively symptom-free without any oxygen lying comfortably.  Review of Systems: As per HPI otherwise 10 point review of systems negative.    Past Medical History:  Diagnosis Date  . Anemia    H&H of 10.6/33 and 07/2008 and 11.9/35 and 09/2010  . Anxiety   . CHF (congestive heart failure) ([place])   . Depression with anxiety   . Diabetes mellitus without complication ([place])   . Enlarged heart   . Fasting hyperglycemia   . Hypertension      Lab: Normal BMet except glucose of 118 in 09/2010  . Hypertensive heart disease 2009   Pulmonary edema postpartum; mild to moderate mitral regurgitation when hospitalized for CHF in 2009; Echocardiogram in 12/2009-no MR and normal EF; normal CXR in 09/2010  . Migraine headache   . Miscarriage 03/19/2013  . Obesity 04/16/2009  . Osteoarthritis, knee 03/29/2011  . Preeclampsia   . Pregnant   . Pulmonary edema   . Sleep apnea   . Threatened abortion in early pregnancy 03/15/2013    Past Surgical History:  Procedure Laterality Date  . BREAST REDUCTION SURGERY  2002  . CARDIAC CATHETERIZATION N/A 12/22/2015   Procedure: Left Heart Cath and Coronary Angiography;  Surgeon: Peter M Martinique, MD;  Location: [facility];  Service: Cardiovascular;  Laterality: N/A;  . CESAREAN SECTION N/A 04/09/2014   Procedure: CESAREAN SECTION;  Surgeon: Mora Bellman, MD;  Location: [facility];  Service: Obstetrics;  Laterality: N/A;  . CHOLECYSTECTOMY       reports that she has never smoked. She has never used smokeless tobacco. She reports that she drinks alcohol. She reports that she does not use drugs.  Allergies  Allergen Reactions  . Diclofenac     Edema  . Tramadol Nausea And Vomiting  . Vicodin [Hydrocodone-Acetaminophen] Nausea Only    Family History  Problem Relation Age of Onset  . Diabetes Mother   . Heart disease Mother   . Hyperlipidemia Paternal Grandfather   .  Hypertension Paternal Grandfather   . Heart disease Father   . Heart disease Maternal Grandmother   . ADD / ADHD Son   . Hypertension Maternal Uncle   . Heart attack Brother   . Sudden death Neg Hx      Prior to Admission medications   Medication Sig Start Date End Date Taking? Authorizing Provider  amLODipine (NORVASC) 5 MG tablet Take 2 tablets (10 mg total) by mouth daily. 02/24/15  Yes Lendon Colonel, NP  aspirin EC 81 MG tablet Take 81 mg by mouth daily.   Yes Historical Provider, MD  carvedilol (COREG) 12.5  MG tablet TAKE 3 TABLETS BY MOUTH TWICE DAILY WITH A MEAL. 08/21/15  Yes Arnoldo Lenis, MD  escitalopram (LEXAPRO) 20 MG tablet Take 1 tablet (20 mg total) by mouth daily. 03/26/16  Yes Estill Dooms, NP  furosemide (LASIX) 40 MG tablet Take 1 tablet (40 mg total) by mouth daily. 03/29/16  Yes Leslie K Sofia, PA-C  isosorbide-hydrALAZINE (BIDIL) 20-37.5 MG tablet Take 1 tablet by mouth 3 (three) times daily.   Yes Historical Provider, MD  metFORMIN (GLUCOPHAGE) 500 MG tablet Take 1 tablet (500 mg total) by mouth 2 (two) times daily with a meal. 12/25/15  Yes Erlene Quan, PA-C  nitroGLYCERIN (NITROSTAT) 0.4 MG SL tablet Place 1 tablet (0.4 mg total) under the tongue every 5 (five) minutes as needed for chest pain. 09/05/15  Yes Kathie Dike, MD  ondansetron (ZOFRAN-ODT) 8 MG disintegrating tablet Take 1 tablet (8 mg total) by mouth every 8 (eight) hours as needed for nausea or vomiting. 10/07/15  Yes Fraser Din Focht, PA  potassium chloride SA (K-DUR,KLOR-CON) 20 MEQ tablet Take 2 tablets (40 mEq total) by mouth daily. 03/27/15  Yes Lendon Colonel, NP  pregabalin (LYRICA) 75 MG capsule Take 75 mg by mouth daily.   Yes Historical Provider, MD  promethazine (PHENERGAN) 12.5 MG tablet Take 1 tablet (12.5 mg total) by mouth every 6 (six) hours as needed for nausea or vomiting. 01/02/16  Yes Lily Kocher, PA-C  sacubitril-valsartan (ENTRESTO) 49-51 MG Take 1 tablet by mouth 2 (two) times daily. 09/05/15  Yes Kathie Dike, MD  sitaGLIPtin (JANUVIA) 100 MG tablet Take 100 mg by mouth daily.   Yes Historical Provider, MD  spironolactone (ALDACTONE) 25 MG tablet Take 1 tablet (25 mg total) by mouth daily. 02/24/15  Yes Lendon Colonel, NP    Physical Exam: Vitals:   04/03/16 0802 04/03/16 0945 04/03/16 1143  BP:  (!) 196/138 (!) 189/139  Pulse: 106 104 99  Resp: 20  20  Temp: 97.8 F (36.6 C)    TempSrc: Oral    SpO2: 97% 99% 99%  Weight: 114.3 kg (252 lb)    Height: 5\' 6"  (1.676 m)         Constitutional: NAD, calm, comfortable Vitals:   04/03/16 0802 04/03/16 0945 04/03/16 1143  BP:  (!) 196/138 (!) 189/139  Pulse: 106 104 99  Resp: 20  20  Temp: 97.8 F (36.6 C)    TempSrc: Oral    SpO2: 97% 99% 99%  Weight: 114.3 kg (252 lb)    Height: 5\' 6"  (1.676 m)     Eyes: PERRL, lids and conjunctivae normal ENMT: Mucous membranes are moist. Posterior pharynx clear of any exudate or lesions.Normal dentition.  Neck: normal, supple, no masses, no thyromegaly Respiratory: clear to auscultation bilaterally, no wheezing, mild bibasilar crackles. Normal respiratory effort. No accessory muscle use.  Cardiovascular: Regular rate  and rhythm, no murmurs / rubs / gallops. No extremity edema. 2+ pedal pulses. No carotid bruits. 1+ leg edema Abdomen: no tenderness, no masses palpated. No hepatosplenomegaly. Bowel sounds positive.  Musculoskeletal: no clubbing / cyanosis. No joint deformity upper and lower extremities. Good ROM, no contractures. Normal muscle tone.  Skin: no rashes, lesions, ulcers. No induration Neurologic: CN 2-12 grossly intact. Sensation intact, DTR normal. Strength 5/5 in all 4.  Psychiatric: Normal judgment and insight. Alert and oriented x 3. Normal mood.    Labs on Admission: I have personally reviewed following labs and imaging studies  CBC:  Recent Labs Lab 03/29/16 1118 04/03/16 0823  WBC 10.3 10.2  NEUTROABS 7.5 7.2  HGB 9.4* 9.6*  HCT 31.2* 32.2*  MCV 70.7* 70.8*  PLT 260 [id number]   Basic Metabolic Panel:  Recent Labs Lab 03/29/16 1118 04/03/16 0823  NA 135 135  K 3.8 3.8  CL 100* 103  CO2 29 25  GLUCOSE 276* 300*  BUN 18 18  CREATININE 0.78 0.95  CALCIUM 8.4* 8.8*   GFR: Estimated Creatinine Clearance: 101 mL/min (by C-G formula based on SCr of 0.95 mg/dL). Liver Function Tests:  Recent Labs Lab 03/29/16 1118 04/03/16 0823  AST 35 56*  ALT 32 57*  ALKPHOS 77 78  BILITOT 0.5 0.4  PROT 6.3* 6.6  ALBUMIN 2.8* 2.9*   No  results for input(s): LIPASE, AMYLASE in the last 168 hours. No results for input(s): AMMONIA in the last 168 hours. Coagulation Profile: No results for input(s): INR, PROTIME in the last 168 hours. Cardiac Enzymes:  Recent Labs Lab 03/29/16 1118 04/03/16 0823  TROPONINI <0.03 <0.03   BNP (last 3 results) No results for input(s): PROBNP in the last 8760 hours. HbA1C: No results for input(s): HGBA1C in the last 72 hours. CBG: No results for input(s): GLUCAP in the last 168 hours. Lipid Profile: No results for input(s): CHOL, HDL, LDLCALC, TRIG, CHOLHDL, LDLDIRECT in the last 72 hours. Thyroid Function Tests: No results for input(s): TSH, T4TOTAL, FREET4, T3FREE, THYROIDAB in the last 72 hours. Anemia Panel: No results for input(s): VITAMINB12, FOLATE, FERRITIN, TIBC, IRON, RETICCTPCT in the last 72 hours. Urine analysis:    Component Value Date/Time   COLORURINE YELLOW 12/08/2015 0045   APPEARANCEUR CLOUDY (A) 12/08/2015 0045   LABSPEC 1.029 12/08/2015 0045   PHURINE 5.5 12/08/2015 0045   GLUCOSEU 100 (A) 12/08/2015 0045   HGBUR NEGATIVE 12/08/2015 0045   BILIRUBINUR NEGATIVE 12/08/2015 0045   KETONESUR NEGATIVE 12/08/2015 0045   PROTEINUR 100 (A) 12/08/2015 0045   UROBILINOGEN 0.2 04/29/2014 1115   NITRITE NEGATIVE 12/08/2015 0045   LEUKOCYTESUR NEGATIVE 12/08/2015 0045   Sepsis Labs: @LABRCNTIP (procalcitonin:4,lacticidven:4) )No results found for this or any previous visit (from the past 240 hour(s)).   Radiological Exams on Admission: Dg Chest 2 View  Result Date: 04/03/2016 CLINICAL DATA:  Short of breath. CHF. Bilateral foot and ankle swelling. EXAM: CHEST  2 VIEW COMPARISON:  03/29/2016 FINDINGS: The heart is moderately enlarged. Vascular congestion. There are no Kerley B-lines. Tiny pleural effusions are suspected. No pneumothorax. No pleural effusion. IMPRESSION: Cardiomegaly with vascular congestion and tiny pleural effusions but no interstitial edema. This is  compatible with mild volume overload. Electronically Signed   By: Marybelle Killings M.D.   On: 04/03/2016 09:10   Ct Chest Wo Contrast  Result Date: 04/03/2016 CLINICAL DATA:  Shortness of breath and coughing for 1 week. Lower extremity edema. Abnormal chest radiograph. EXAM: CT CHEST WITHOUT CONTRAST TECHNIQUE: Multidetector  CT imaging of the chest was performed following the standard protocol without IV contrast. COMPARISON:  Chest radiograph on 04/03/2016 FINDINGS: Cardiovascular: Mild to moderate cardiomegaly. No evidence of pericardial effusion. Mediastinum/Nodes: No masses or pathologically enlarged lymph nodes identified on this unenhanced exam. Lungs/Pleura: Mild heterogeneous ground-glass opacity is seen in both lungs with mild patchy airspace disease in both lower lobes. This appears symmetric. Small right pleural effusion also noted. No evidence of pulmonary mass. Upper Abdomen:  Unremarkable. Musculoskeletal: No suspicious bone lesions or other significant abnormality. IMPRESSION: Cardiomegaly and small right pleural effusion. Symmetric mild pulmonary ground-glass opacity and patchy airspace disease in both lower lobes, suspicious for pulmonary edema with pneumonia considered less likely. No mass or lymphadenopathy identified. Electronically Signed   By: Earle Gell M.D.   On: 04/03/2016 10:24    EKG: Independently reviewed. Sinus tachycardia with nonspecific lateral T-wave changes similar to previous EKG  Assessment/Plan   1. Acute on chronic combined systolic and diastolic CHF last EF [id number].  In the setting of hypertensive crisis. Known history of hypertensive cardiovascular disease. She has not taken her blood pressure medications today due to nausea vomiting, CHF symptoms have been ongoing for the last few days, she will be admitted to stepdown for IV nitroglycerin drip, will resume her oral blood pressure medications now and titrate off nitroglycerin drip once blood pressure is under 160 on a  sustained basis. IV Lasix, salt and fluid restriction, daily weight, monitor intake and output. Repeat echocardiogram. Continue beta blocker and Entresto.  Note patient's left heart cath report reviewed from 12/22/2015. She has no significant CAD.  2. Hypertensive crisis. Resume oral blood pressure medications, nitro drip, as needed IV hydralazine and monitor.  3. Chronic microcytic anemia. H&H stable, no need for transfusion, outpatient workup as age-appropriate.  4. Depression. Stable, not suicidal or homicidal, continue Lexapro.  5. Morbid obesity. Follow with PCP for weight loss.   6. DM type II. Continue oral regimen and add sliding scale. Recent A1c noted.  Lab Results  Component Value Date   HGBA1C 9.8 (H) 03/11/2016     DVT prophylaxis: heparin Code Status: Full Family Communication: No family at bedside Disposition Plan: Discharge once improved, patient after admission requested to be transferred to [facility] as she was afraid she is current diet here. At this time there is no medical necessity or need, she is hemodynamically stable, transfer at this time to a tertiary or higher level of care is medically not indicated. Consults called: None Admission status: Admit to inpatient stepdown   Thurnell Lose, MD [facility] If 7PM-7AM, please contact night-coverage [email or web address] Password [email or web address]  04/03/2016, 12:32 PM   By signing my name below, I, Hilbert Odor, attest that this documentation has been prepared under the direction and in the presence of Thurnell Lose, MD. Electronically signed: Hilbert Odor, Scribe.  04/03/16, 12:38 PM

## 2016-04-03 NOTE — Discharge Summary (Signed)
AMA  Patient at this time expresses desire to leave the Hospital immidiately, she signed out AMA, she had expressed earlier in am to go to [facility] while in the ER, EDP talked to the patient that there was no need for it and she agreed to be admitted here, I was called to admit, I did my H&P with Scribe chris and ED RN in the room at all times, patient never voiced any concerns and agreed with admit plan, soon after I left I was paged that patient now again has changed her mind and wants to sent to [facility] as her mother died in this hospital, I informed the RN that there is absolutely no medical need for the same and we cannot tie our resources for a transfer that is not indicated based on medical need, however medically she cannot be discharged home either, if family wants to transport her she will have to signout AMA. patient then signed out AMA.   Thurnell Lose M.D on 04/03/2016 at 2:03 PM  [facility]    Last Note Below   History and Physical    Chelsey Santiago [id number] DOB: 1975/10/24 DOA: 04/03/2016  Referring MD/NP/PA: Fredia Sorrow, MD. PCP: Rosita Fire, MD  Outpatient Specialists:   Cardiology: Yehuda Savannah, MD  Obstetrics and :Gynecology: Florian Buff, MD  Patient coming from: Home  Chief Complaint: Edema  HPI: Chelsey Santiago is a 40 y.o. female with medical history significant of HTN, OSA, Systolic CHF, depression, and DM type 2, presents to the ED with edema for the past few days. She has associated SOB for the past 6 days along with nausea and vomiting. Her SOB worsens while lying down and upon exertion. She has been having multiple episodes of emesis a day, she could not take her blood pressure medications today, she also had some atypical substernal stabbing chest pain with shortness of breath earlier which  has now resolved.  Came to the ER for edema and shortness of breath along with nausea vomiting, she says that her nausea vomiting always comes up when she has CHF issues, in the ER she was diagnosed with acute on chronic combined systolic and diastolic CHF with elevated BNP, she was given Lasix and I was called to admit.   ED Course: While in the ED, she was given Zofran and lasix with much relief. She was noted to be hypertensive. LFTs are elevated. BNP 2,047, Hgb 9.6. CXR shows some vascular congestion. EKG shows sinus tachycardia. She was admitted for treatment of acute on chronic him mind systolic and diastolic CHF, she is currently relatively symptom-free without any oxygen lying comfortably.  Review of Systems: As per HPI otherwise 10 point review of systems negative.    Past Medical History:  Diagnosis Date  . Anemia    H&H of 10.6/33 and 07/2008 and 11.9/35 and 09/2010  . Anxiety   . CHF (congestive heart failure) ([place])   . Depression with anxiety   . Diabetes mellitus without complication ([place])   . Enlarged heart   . Fasting hyperglycemia   . Hypertension    Lab: Normal BMet except glucose of 118 in 09/2010  .  Hypertensive heart disease 2009   Pulmonary edema postpartum; mild to moderate mitral regurgitation when hospitalized for CHF in 2009; Echocardiogram in 12/2009-no MR and normal EF; normal CXR in 09/2010  . Migraine headache   . Miscarriage 03/19/2013  . Obesity 04/16/2009  . Osteoarthritis, knee 03/29/2011  . Preeclampsia   . Pregnant   . Pulmonary edema   . Sleep apnea   . Threatened abortion in early pregnancy 03/15/2013    Past Surgical History:  Procedure Laterality Date  . BREAST REDUCTION SURGERY  2002  . CARDIAC CATHETERIZATION N/A 12/22/2015   Procedure: Left Heart Cath and Coronary Angiography;  Surgeon: Peter M Martinique, MD;  Location: [place] [facility];  Service: Cardiovascular;  Laterality: N/A;  . CESAREAN SECTION N/A 04/09/2014   Procedure: CESAREAN  SECTION;  Surgeon: Mora Bellman, MD;  Location: [facility];  Service: Obstetrics;  Laterality: N/A;  . CHOLECYSTECTOMY       reports that she has never smoked. She has never used smokeless tobacco. She reports that she drinks alcohol. She reports that she does not use drugs.  Allergies  Allergen Reactions  . Diclofenac     Edema  . Tramadol Nausea And Vomiting  . Vicodin [Hydrocodone-Acetaminophen] Nausea Only    Family History  Problem Relation Age of Onset  . Diabetes Mother   . Heart disease Mother   . Hyperlipidemia Paternal Grandfather   . Hypertension Paternal Grandfather   . Heart disease Father   . Heart disease Maternal Grandmother   . ADD / ADHD Son   . Hypertension Maternal Uncle   . Heart attack Brother   . Sudden death Neg Hx      Prior to Admission medications   Medication Sig Start Date End Date Taking? Authorizing Provider  amLODipine (NORVASC) 5 MG tablet Take 2 tablets (10 mg total) by mouth daily. 02/24/15  Yes Lendon Colonel, NP  aspirin EC 81 MG tablet Take 81 mg by mouth daily.   Yes Historical Provider, MD  carvedilol (COREG) 12.5 MG tablet TAKE 3 TABLETS BY MOUTH TWICE DAILY WITH A MEAL. 08/21/15  Yes Arnoldo Lenis, MD  escitalopram (LEXAPRO) 20 MG tablet Take 1 tablet (20 mg total) by mouth daily. 03/26/16  Yes Estill Dooms, NP  furosemide (LASIX) 40 MG tablet Take 1 tablet (40 mg total) by mouth daily. 03/29/16  Yes Leslie K Sofia, PA-C  isosorbide-hydrALAZINE (BIDIL) 20-37.5 MG tablet Take 1 tablet by mouth 3 (three) times daily.   Yes Historical Provider, MD  metFORMIN (GLUCOPHAGE) 500 MG tablet Take 1 tablet (500 mg total) by mouth 2 (two) times daily with a meal. 12/25/15  Yes Erlene Quan, PA-C  nitroGLYCERIN (NITROSTAT) 0.4 MG SL tablet Place 1 tablet (0.4 mg total) under the tongue every 5 (five) minutes as needed for chest pain. 09/05/15  Yes Kathie Dike, MD  ondansetron (ZOFRAN-ODT) 8 MG disintegrating tablet Take 1 tablet (8 mg  total) by mouth every 8 (eight) hours as needed for nausea or vomiting. 10/07/15  Yes Fraser Din Focht, PA  potassium chloride SA (K-DUR,KLOR-CON) 20 MEQ tablet Take 2 tablets (40 mEq total) by mouth daily. 03/27/15  Yes Lendon Colonel, NP  pregabalin (LYRICA) 75 MG capsule Take 75 mg by mouth daily.   Yes Historical Provider, MD  promethazine (PHENERGAN) 12.5 MG tablet Take 1 tablet (12.5 mg total) by mouth every 6 (six) hours as needed for nausea or vomiting. 01/02/16  Yes Lily Kocher, PA-C  sacubitril-valsartan (ENTRESTO) [phone]  MG Take 1 tablet by mouth 2 (two) times daily. 09/05/15  Yes Kathie Dike, MD  sitaGLIPtin (JANUVIA) 100 MG tablet Take 100 mg by mouth daily.   Yes Historical Provider, MD  spironolactone (ALDACTONE) 25 MG tablet Take 1 tablet (25 mg total) by mouth daily. 02/24/15  Yes Lendon Colonel, NP    Physical Exam: Vitals:   04/03/16 0802 04/03/16 0945 04/03/16 1143 04/03/16 1254  BP:  (!) 196/138 (!) 189/139 (!) 178/137  Pulse: 106 104 99 96  Resp: 20  20 20   Temp: 97.8 F (36.6 C)     TempSrc: Oral     SpO2: 97% 99% 99% 99%  Weight: 114.3 kg (252 lb)     Height: 5\' 6"  (1.676 m)         Constitutional: NAD, calm, comfortable Vitals:   04/03/16 0802 04/03/16 0945 04/03/16 1143 04/03/16 1254  BP:  (!) 196/138 (!) 189/139 (!) 178/137  Pulse: 106 104 99 96  Resp: 20  20 20   Temp: 97.8 F (36.6 C)     TempSrc: Oral     SpO2: 97% 99% 99% 99%  Weight: 114.3 kg (252 lb)     Height: 5\' 6"  (1.676 m)      Eyes: PERRL, lids and conjunctivae normal ENMT: Mucous membranes are moist. Posterior pharynx clear of any exudate or lesions.Normal dentition.  Neck: normal, supple, no masses, no thyromegaly Respiratory: clear to auscultation bilaterally, no wheezing, mild bibasilar crackles. Normal respiratory effort. No accessory muscle use.  Cardiovascular: Regular rate and rhythm, no murmurs / rubs / gallops. No extremity edema. 2+ pedal pulses. No carotid bruits. 1+  leg edema Abdomen: no tenderness, no masses palpated. No hepatosplenomegaly. Bowel sounds positive.  Musculoskeletal: no clubbing / cyanosis. No joint deformity upper and lower extremities. Good ROM, no contractures. Normal muscle tone.  Skin: no rashes, lesions, ulcers. No induration Neurologic: CN 2-12 grossly intact. Sensation intact, DTR normal. Strength 5/5 in all 4.  Psychiatric: Normal judgment and insight. Alert and oriented x 3. Normal mood.    Labs on Admission: I have personally reviewed following labs and imaging studies  CBC:  Recent Labs Lab 03/29/16 1118 04/03/16 0823  WBC 10.3 10.2  NEUTROABS 7.5 7.2  HGB 9.4* 9.6*  HCT 31.2* 32.2*  MCV 70.7* 70.8*  PLT 260 [id number]   Basic Metabolic Panel:  Recent Labs Lab 03/29/16 1118 04/03/16 0823  NA 135 135  K 3.8 3.8  CL 100* 103  CO2 29 25  GLUCOSE 276* 300*  BUN 18 18  CREATININE 0.78 0.95  CALCIUM 8.4* 8.8*   GFR: Estimated Creatinine Clearance: 101 mL/min (by C-G formula based on SCr of 0.95 mg/dL). Liver Function Tests:  Recent Labs Lab 03/29/16 1118 04/03/16 0823  AST 35 56*  ALT 32 57*  ALKPHOS 77 78  BILITOT 0.5 0.4  PROT 6.3* 6.6  ALBUMIN 2.8* 2.9*   No results for input(s): LIPASE, AMYLASE in the last 168 hours. No results for input(s): AMMONIA in the last 168 hours. Coagulation Profile: No results for input(s): INR, PROTIME in the last 168 hours. Cardiac Enzymes:  Recent Labs Lab 03/29/16 1118 04/03/16 0823  TROPONINI <0.03 <0.03   BNP (last 3 results) No results for input(s): PROBNP in the last 8760 hours. HbA1C: No results for input(s): HGBA1C in the last 72 hours. CBG: No results for input(s): GLUCAP in the last 168 hours. Lipid Profile: No results for input(s): CHOL, HDL, LDLCALC, TRIG, CHOLHDL, LDLDIRECT in the last 72  hours. Thyroid Function Tests: No results for input(s): TSH, T4TOTAL, FREET4, T3FREE, THYROIDAB in the last 72 hours. Anemia Panel: No results for input(s):  VITAMINB12, FOLATE, FERRITIN, TIBC, IRON, RETICCTPCT in the last 72 hours. Urine analysis:    Component Value Date/Time   COLORURINE YELLOW 12/08/2015 0045   APPEARANCEUR CLOUDY (A) 12/08/2015 0045   LABSPEC 1.029 12/08/2015 0045   PHURINE 5.5 12/08/2015 0045   GLUCOSEU 100 (A) 12/08/2015 0045   HGBUR NEGATIVE 12/08/2015 0045   BILIRUBINUR NEGATIVE 12/08/2015 0045   KETONESUR NEGATIVE 12/08/2015 0045   PROTEINUR 100 (A) 12/08/2015 0045   UROBILINOGEN 0.2 04/29/2014 1115   NITRITE NEGATIVE 12/08/2015 0045   LEUKOCYTESUR NEGATIVE 12/08/2015 0045   Sepsis Labs: @LABRCNTIP (procalcitonin:4,lacticidven:4) )No results found for this or any previous visit (from the past 240 hour(s)).   Radiological Exams on Admission: Dg Chest 2 View  Result Date: 04/03/2016 CLINICAL DATA:  Short of breath. CHF. Bilateral foot and ankle swelling. EXAM: CHEST  2 VIEW COMPARISON:  03/29/2016 FINDINGS: The heart is moderately enlarged. Vascular congestion. There are no Kerley B-lines. Tiny pleural effusions are suspected. No pneumothorax. No pleural effusion. IMPRESSION: Cardiomegaly with vascular congestion and tiny pleural effusions but no interstitial edema. This is compatible with mild volume overload. Electronically Signed   By: Marybelle Killings M.D.   On: 04/03/2016 09:10   Ct Chest Wo Contrast  Result Date: 04/03/2016 CLINICAL DATA:  Shortness of breath and coughing for 1 week. Lower extremity edema. Abnormal chest radiograph. EXAM: CT CHEST WITHOUT CONTRAST TECHNIQUE: Multidetector CT imaging of the chest was performed following the standard protocol without IV contrast. COMPARISON:  Chest radiograph on 04/03/2016 FINDINGS: Cardiovascular: Mild to moderate cardiomegaly. No evidence of pericardial effusion. Mediastinum/Nodes: No masses or pathologically enlarged lymph nodes identified on this unenhanced exam. Lungs/Pleura: Mild heterogeneous ground-glass opacity is seen in both lungs with mild patchy airspace  disease in both lower lobes. This appears symmetric. Small right pleural effusion also noted. No evidence of pulmonary mass. Upper Abdomen:  Unremarkable. Musculoskeletal: No suspicious bone lesions or other significant abnormality. IMPRESSION: Cardiomegaly and small right pleural effusion. Symmetric mild pulmonary ground-glass opacity and patchy airspace disease in both lower lobes, suspicious for pulmonary edema with pneumonia considered less likely. No mass or lymphadenopathy identified. Electronically Signed   By: Earle Gell M.D.   On: 04/03/2016 10:24    EKG: Independently reviewed. Sinus tachycardia with nonspecific lateral T-wave changes similar to previous EKG  Assessment/Plan   1. Acute on chronic combined systolic and diastolic CHF last EF [id number].  In the setting of hypertensive crisis. Known history of hypertensive cardiovascular disease. She has not taken her blood pressure medications today due to nausea vomiting, CHF symptoms have been ongoing for the last few days, she will be admitted to stepdown for IV nitroglycerin drip, will resume her oral blood pressure medications now and titrate off nitroglycerin drip once blood pressure is under 160 on a sustained basis. IV Lasix, salt and fluid restriction, daily weight, monitor intake and output. Repeat echocardiogram. Continue beta blocker and Entresto.  Note patient's left heart cath report reviewed from 12/22/2015. She has no significant CAD.  2. Hypertensive crisis. Resume oral blood pressure medications, nitro drip, as needed IV hydralazine and monitor.  3. Chronic microcytic anemia. H&H stable, no need for transfusion, outpatient workup as age-appropriate.  4. Depression. Stable, not suicidal or homicidal, continue Lexapro.  5. Morbid obesity. Follow with PCP for weight loss.   6. DM type II. Continue oral regimen and  add sliding scale. Recent A1c noted.  Lab Results  Component Value Date   HGBA1C 9.8 (H) 03/11/2016     DVT  prophylaxis: heparin Code Status: Full Family Communication: No family at bedside Disposition Plan: Discharge once improved, patient after admission requested to be transferred to [facility] as she was afraid she is current diet here. At this time there is no medical necessity or need, she is hemodynamically stable, transfer at this time to a tertiary or higher level of care is medically not indicated. Consults called: None Admission status: Admit to inpatient stepdown   Thurnell Lose, MD [facility] If 7PM-7AM, please contact night-coverage [email or web address] Password [email or web address]  04/03/2016, 2:03 PM   By signing my name below, I, Hilbert Odor, attest that this documentation has been prepared under the direction and in the presence of Thurnell Lose, MD. Electronically signed: Hilbert Odor, Scribe.  04/03/16, 12:38 PM

## 2016-04-03 NOTE — ED Provider Notes (Signed)
[facility] DEPT Provider Note   CSN: [id number] Arrival date & time: 04/03/16  0750  By signing my name below, I, Hansel Feinstein, attest that this documentation has been prepared under the direction and in the presence of Fredia Sorrow, MD. Electronically Signed: Hansel Feinstein, ED Scribe. 04/03/16. 8:17 AM.     History   Chief Complaint Chief Complaint  Patient presents with  . Emesis  . Cough    HPI ADELAINE FICKE is a 40 y.o. female with h/o CHF, HTN, DM who presents to the Emergency Department complaining of persistent, gradually worsening SOB x 6 days with associated cough and congestion x 10 days. Pt also complains of nausea and multiple episodes of emesis for 6 days, left-sided CP worsened with coughing, epigastric abdominal pain worsened with eating, bilateral leg swelling and visual disturbance described as "seeing dots". Pt reports she is experiencing nausea and vomiting outside the context of coughing. Pt was last seen in the ED on 03/29/16 for evaluation of the same SOB and was dc home after tx with Lasix and 1500 cc urine output. She states she was not given tx for nausea or vomiting as her symptoms were not as severe at that time. Pt denies fever, rhinorrhea, sore throat, diarrhea, dysuria, hematuria, back pain, neck pain, rash, HA, bruising easily.    The history is provided by the patient. No language interpreter was used.  Emesis   This is a new problem. The current episode started more than 2 days ago. The problem occurs 2 to 4 times per day. The problem has been gradually worsening. There has been no fever. Associated symptoms include abdominal pain and cough. Pertinent negatives include no diarrhea, no fever and no headaches.    Past Medical History:  Diagnosis Date  . Anemia    H&H of 10.6/33 and 07/2008 and 11.9/35 and 09/2010  . Anxiety   . CHF (congestive heart failure) ([place])   . Depression with anxiety   . Diabetes mellitus without complication ([place])   .  Enlarged heart   . Fasting hyperglycemia   . Hypertension    Lab: Normal BMet except glucose of 118 in 09/2010  . Hypertensive heart disease 2009   Pulmonary edema postpartum; mild to moderate mitral regurgitation when hospitalized for CHF in 2009; Echocardiogram in 12/2009-no MR and normal EF; normal CXR in 09/2010  . Migraine headache   . Miscarriage 03/19/2013  . Obesity 04/16/2009  . Osteoarthritis, knee 03/29/2011  . Preeclampsia   . Pregnant   . Pulmonary edema   . Sleep apnea   . Threatened abortion in early pregnancy 03/15/2013    Patient Active Problem List   Diagnosis Date Noted  . Acute on chronic combined systolic and diastolic ACC/AHA stage C congestive heart failure ([place]) 04/03/2016  . Acute on chronic combined systolic and diastolic CHF, NYHA class 4 ([place]) 04/03/2016  . Cardiomyopathy due to hypertension ([place]) 12/22/2015  . Normal coronary arteries 12/22/2015  . Troponin level elevated 12/22/2015  . NSTEMI (non-ST elevated myocardial infarction) ([place]) 12/20/2015  . Dental infection 10/10/2015  . Chest pain 09/05/2015  . Systolic CHF, chronic ([place]) 09/05/2015  . LLQ pain   . DM type 2 (diabetes mellitus, type 2) ([place]) 04/28/2014  . Essential hypertension   . 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 Hypertensive urgency 04/22/2014  . Acute CHF ([place]) 04/22/2014  . S/P cesarean section 04/11/2014  . Pulmonary edema 04/11/2014  . Postoperative anemia 04/11/2014  . Elevated serum creatinine 04/11/2014  . Preeclampsia,  severe 04/09/2014  . Pre-eclampsia superimposed on chronic hypertension, antepartum 04/08/2014  . Dyspnea 04/08/2014  . Polyhydramnios in third trimester, antepartum 03/14/2014  . Abnormal maternal glucose tolerance, antepartum 03/11/2014  . High-risk pregnancy 03/11/2014  . Chronic hypertension in pregnancy 03/11/2014  . Impaired glucose tolerance during pregnancy, antepartum 11/27/2013  . Fibroids 11/22/2013  . History of gestational diabetes in prior  pregnancy, currently pregnant in first trimester 11/22/2013  . Hx of preeclampsia, prior pregnancy, currently pregnant 11/22/2013  . Short interval between pregnancies affecting pregnancy, antepartum 11/22/2013  . Supervision of high-risk pregnancy of elderly primigravida (>= 67 years old at delivery), third trimester 11/22/2013  . Miscarriage 03/19/2013  . 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 Hypertension   . Hypertensive cardiovascular disease   . Microcytic anemia   . Osteoarthritis, knee 03/29/2011  . Sleep apnea 12/09/2009  . Obesity 04/16/2009    Past Surgical History:  Procedure Laterality Date  . BREAST REDUCTION SURGERY  2002  . CARDIAC CATHETERIZATION N/A 12/22/2015   Procedure: Left Heart Cath and Coronary Angiography;  Surgeon: Peter M Martinique, MD;  Location: [facility];  Service: Cardiovascular;  Laterality: N/A;  . CESAREAN SECTION N/A 04/09/2014   Procedure: CESAREAN SECTION;  Surgeon: Mora Bellman, MD;  Location: [facility];  Service: Obstetrics;  Laterality: N/A;  . CHOLECYSTECTOMY      OB History    Gravida Para Term Preterm AB Living   11 6 5 1 5 6    SAB TAB Ectopic Multiple Live Births   3 2   0 6       Home Medications    Prior to Admission medications   Medication Sig Start Date End Date Taking? Authorizing Provider  amLODipine (NORVASC) 5 MG tablet Take 2 tablets (10 mg total) by mouth daily. 02/24/15  Yes Lendon Colonel, NP  aspirin EC 81 MG tablet Take 81 mg by mouth daily.   Yes Historical Provider, MD  carvedilol (COREG) 12.5 MG tablet TAKE 3 TABLETS BY MOUTH TWICE DAILY WITH A MEAL. 08/21/15  Yes Arnoldo Lenis, MD  escitalopram (LEXAPRO) 20 MG tablet Take 1 tablet (20 mg total) by mouth daily. 03/26/16  Yes Estill Dooms, NP  furosemide (LASIX) 40 MG tablet Take 1 tablet (40 mg total) by mouth daily. 03/29/16  Yes Leslie K Sofia, PA-C  isosorbide-hydrALAZINE (BIDIL) 20-37.5 MG tablet Take 1 tablet by mouth 3 (three) times daily.   Yes  Historical Provider, MD  metFORMIN (GLUCOPHAGE) 500 MG tablet Take 1 tablet (500 mg total) by mouth 2 (two) times daily with a meal. 12/25/15  Yes Erlene Quan, PA-C  nitroGLYCERIN (NITROSTAT) 0.4 MG SL tablet Place 1 tablet (0.4 mg total) under the tongue every 5 (five) minutes as needed for chest pain. 09/05/15  Yes Kathie Dike, MD  ondansetron (ZOFRAN-ODT) 8 MG disintegrating tablet Take 1 tablet (8 mg total) by mouth every 8 (eight) hours as needed for nausea or vomiting. 10/07/15  Yes Fraser Din Focht, PA  potassium chloride SA (K-DUR,KLOR-CON) 20 MEQ tablet Take 2 tablets (40 mEq total) by mouth daily. 03/27/15  Yes Lendon Colonel, NP  pregabalin (LYRICA) 75 MG capsule Take 75 mg by mouth daily.   Yes Historical Provider, MD  promethazine (PHENERGAN) 12.5 MG tablet Take 1 tablet (12.5 mg total) by mouth every 6 (six) hours as needed for nausea or vomiting. 01/02/16  Yes Lily Kocher, PA-C  sacubitril-valsartan (ENTRESTO) 49-51 MG Take 1 tablet by mouth 2 (two) times daily. 09/05/15  Yes Kathie Dike, MD  sitaGLIPtin (JANUVIA) 100 MG tablet Take 100 mg by mouth daily.   Yes Historical Provider, MD  spironolactone (ALDACTONE) 25 MG tablet Take 1 tablet (25 mg total) by mouth daily. 02/24/15  Yes Lendon Colonel, NP    Family History Family History  Problem Relation Age of Onset  . Diabetes Mother   . Heart disease Mother   . Hyperlipidemia Paternal Grandfather   . Hypertension Paternal Grandfather   . Heart disease Father   . Heart disease Maternal Grandmother   . ADD / ADHD Son   . Hypertension Maternal Uncle   . Heart attack Brother   . Sudden death Neg Hx     Social History Social History  Substance Use Topics  . Smoking status: Never Smoker  . Smokeless tobacco: Never Used  . Alcohol use Yes     Comment: occ     Allergies   Diclofenac; Tramadol; and Vicodin [hydrocodone-acetaminophen]   Review of Systems Review of Systems  Constitutional: Negative for fever.   HENT: Positive for congestion. Negative for rhinorrhea and sore throat.   Eyes: Positive for visual disturbance.  Respiratory: Positive for cough and shortness of breath.   Cardiovascular: Positive for chest pain and leg swelling.  Gastrointestinal: Positive for abdominal pain, nausea and vomiting. Negative for diarrhea.  Genitourinary: Negative for dysuria and hematuria.  Musculoskeletal: Negative for back pain and neck pain.  Skin: Negative for rash.  Neurological: Negative for headaches.  Hematological: Does not bruise/bleed easily.  Psychiatric/Behavioral: Negative for confusion.     Physical Exam Updated Vital Signs BP (!) 189/139 (BP Location: Left Arm)   Pulse 99   Temp 97.8 F (36.6 C) (Oral)   Resp 20   Ht 5\' 6"  (1.676 m)   Wt 114.3 kg   LMP 04/03/2016 (Exact Date)   SpO2 99%   BMI 40.67 kg/m   Physical Exam  Constitutional: She is oriented to person, place, and time. She appears well-developed and well-nourished.  HENT:  Head: Normocephalic.  Eyes: Conjunctivae and EOM are normal. Pupils are equal, round, and reactive to light. No scleral icterus.  Cardiovascular: Regular rhythm.  Tachycardia present.   Tachycardic but regular  Pulmonary/Chest: Effort normal and breath sounds normal. No respiratory distress. She has no wheezes. She has no rales.  Lungs CTA bilaterally.   Abdominal: Soft. Bowel sounds are normal. She exhibits no distension. There is no tenderness.  Musculoskeletal: Normal range of motion. She exhibits edema.  Trace pitting edema BLE  Neurological: She is alert and oriented to person, place, and time. No cranial nerve deficit or sensory deficit. She exhibits normal muscle tone. Coordination normal.  Skin: Skin is warm and dry.  Psychiatric: She has a normal mood and affect. Her behavior is normal.  Nursing note and vitals reviewed.    ED Treatments / Results   DIAGNOSTIC STUDIES: Oxygen Saturation is 97% on RA, normal by my interpretation.     COORDINATION OF CARE: 8:14 AM Discussed treatment plan with pt at bedside which includes antiemetic and pt agreed to plan.    Labs (all labs ordered are listed, but only abnormal results are displayed) Labs Reviewed  COMPREHENSIVE METABOLIC PANEL - Abnormal; Notable for the following:       Result Value   Glucose, Bld 300 (*)    Calcium 8.8 (*)    Albumin 2.9 (*)    AST 56 (*)    ALT 57 (*)    All other components within  normal limits  CBC WITH DIFFERENTIAL/PLATELET - Abnormal; Notable for the following:    Hemoglobin 9.6 (*)    HCT 32.2 (*)    MCV 70.8 (*)    MCH 21.1 (*)    MCHC 29.8 (*)    RDW 17.1 (*)    All other components within normal limits  BRAIN NATRIURETIC PEPTIDE - Abnormal; Notable for the following:    B Natriuretic Peptide 2,047.0 (*)    All other components within normal limits  TROPONIN I  HCG, QUANTITATIVE, PREGNANCY    EKG  EKG Interpretation  Date/Time:  Saturday April 03 2016 08:15:03 EST Ventricular Rate:  101 PR Interval:    QRS Duration: 102 QT Interval:  389 QTC Calculation: 505 R Axis:   80 Text Interpretation:  Sinus tachycardia Probable left ventricular hypertrophy Borderline T abnormalities, diffuse leads Borderline prolonged QT interval Confirmed by Rogene Houston  MD, Carlis Blanchard [phone]) on 04/03/2016 8:23:46 AM       Radiology Dg Chest 2 View  Result Date: 04/03/2016 CLINICAL DATA:  Short of breath. CHF. Bilateral foot and ankle swelling. EXAM: CHEST  2 VIEW COMPARISON:  03/29/2016 FINDINGS: The heart is moderately enlarged. Vascular congestion. There are no Kerley B-lines. Tiny pleural effusions are suspected. No pneumothorax. No pleural effusion. IMPRESSION: Cardiomegaly with vascular congestion and tiny pleural effusions but no interstitial edema. This is compatible with mild volume overload. Electronically Signed   By: Marybelle Killings M.D.   On: 04/03/2016 09:10   Ct Chest Wo Contrast  Result Date: 04/03/2016 CLINICAL DATA:   Shortness of breath and coughing for 1 week. Lower extremity edema. Abnormal chest radiograph. EXAM: CT CHEST WITHOUT CONTRAST TECHNIQUE: Multidetector CT imaging of the chest was performed following the standard protocol without IV contrast. COMPARISON:  Chest radiograph on 04/03/2016 FINDINGS: Cardiovascular: Mild to moderate cardiomegaly. No evidence of pericardial effusion. Mediastinum/Nodes: No masses or pathologically enlarged lymph nodes identified on this unenhanced exam. Lungs/Pleura: Mild heterogeneous ground-glass opacity is seen in both lungs with mild patchy airspace disease in both lower lobes. This appears symmetric. Small right pleural effusion also noted. No evidence of pulmonary mass. Upper Abdomen:  Unremarkable. Musculoskeletal: No suspicious bone lesions or other significant abnormality. IMPRESSION: Cardiomegaly and small right pleural effusion. Symmetric mild pulmonary ground-glass opacity and patchy airspace disease in both lower lobes, suspicious for pulmonary edema with pneumonia considered less likely. No mass or lymphadenopathy identified. Electronically Signed   By: Earle Gell M.D.   On: 04/03/2016 10:24    Procedures Procedures (including critical care time)  CRITICAL CARE Performed by: Fredia Sorrow Total critical care time: 30 minutes Critical care time was exclusive of separately billable procedures and treating other patients. Critical care was necessary to treat or prevent imminent or life-threatening deterioration. Critical care was time spent personally by me on the following activities: development of treatment plan with patient and/or surrogate as well as nursing, discussions with consultants, evaluation of patient's response to treatment, examination of patient, obtaining history from patient or surrogate, ordering and performing treatments and interventions, ordering and review of laboratory studies, ordering and review of radiographic studies, pulse oximetry and  re-evaluation of patient's condition.   Medications Ordered in ED Medications  0.9 %  sodium chloride infusion (not administered)  nitroGLYCERIN 50 mg in dextrose 5 % 250 mL (0.2 mg/mL) infusion (not administered)  aspirin EC tablet 81 mg (not administered)  carvedilol (COREG) tablet 12.5 mg (not administered)  escitalopram (LEXAPRO) tablet 20 mg (not administered)  isosorbide-hydrALAZINE (BIDIL) 20-37.5 MG per  tablet 1 tablet (not administered)  nitroGLYCERIN (NITROSTAT) SL tablet 0.4 mg (not administered)  linagliptin (TRADJENTA) tablet 5 mg (not administered)  sacubitril-valsartan (ENTRESTO) 49-51 mg per tablet (not administered)  spironolactone (ALDACTONE) tablet 25 mg (not administered)  pregabalin (LYRICA) capsule 75 mg (not administered)  potassium chloride SA (K-DUR,KLOR-CON) CR tablet 40 mEq (not administered)  ondansetron (ZOFRAN) injection 4 mg (4 mg Intravenous Given 04/03/16 0834)  furosemide (LASIX) injection 80 mg (80 mg Intravenous Given 04/03/16 0834)  acetaminophen (TYLENOL) tablet 1,000 mg (1,000 mg Oral Given 04/03/16 1236)     Initial Impression / Assessment and Plan / ED Course  I have reviewed the triage vital signs and the nursing notes.  Pertinent labs & imaging results that were available during my care of the patient were reviewed by me and considered in my medical decision making (see chart for details).  Clinical Course     Patient clinically with history consistent with an exacerbation of CHF. CT scan of the chest shows evidence of pulmonary edema. Patient given 80 mg Lasix IV. Patient's blood pressures remained elevated with diastolics in the [id number]. Patient started on nitroglycerin drip to bring systolic pressures down to at least 160.Marland Kitchen Tendinitis a hypertensive emergency with pulmonary edema. Patient's oxygen saturations were fine. Rest of workup was negative. Patient will be admitted to step down unit.  Final Clinical Impressions(s) / ED Diagnoses    Final diagnoses:  Congestive heart failure, unspecified congestive heart failure chronicity, unspecified congestive heart failure type [facility])  Essential hypertension    New Prescriptions New Prescriptions   No medications on file    I personally performed the services described in this documentation, which was scribed in my presence. The recorded information has been reviewed and is accurate.      Fredia Sorrow, MD 04/03/16 1243

## 2016-04-03 NOTE — ED Notes (Signed)
Patient transported to CT 

## 2016-04-03 NOTE — ED Notes (Signed)
hospitalist in to eval for admission

## 2016-04-03 NOTE — ED Notes (Signed)
EDP back in to re eval 

## 2016-04-03 NOTE — H&P (Signed)
History and Physical    ADELIZ ABERG [id number] DOB: 1975/08/11 DOA: 04/03/2016  PCP: Rosita Fire, MD  Patient coming from: Home  Chief Complaint: SOB  HPI: Chelsey Santiago is a 40 y.o. female with medical history significant of combined CHF, HTN, DM 2 and OSA came to the hospital complaining about SOB. Patient reported DOE, orthopnea, PND and lower extremity edema started on Monday, she was evaluated in the ED had been on diuretics was switched to Lasix, patient also has some nausea and vomiting so was not able to keep medications down. She came back today to [facility] ED and was admitted to the hospital, she asked staff to transfer her to [facility] as she does not want to be admitted to [facility], her mother died over there. Patient left AMA when she was told she will not be admitted to [facility].  ED Course:  Vitals: Blood pressure 196/138 Labs: BUN 14 and creatinine 1.1 Imaging: CT scan showed groundglass appearance and mild effusion consistent with pulmonary edema Interventions: Started on nitroglycerin drip and IV Lasix.  Review of Systems:  Constitutional: negative for anorexia, fevers and sweats Eyes: negative for irritation, redness and visual disturbance Ears, nose, mouth, throat, and face: negative for earaches, epistaxis, nasal congestion and sore throat Respiratory: negative for cough, dyspnea on exertion, sputum and wheezing Cardiovascular: Positive for SOB, DOE and lower extremity edema per history of present illness Gastrointestinal: negative for abdominal pain, constipation, diarrhea, melena, nausea and vomiting Genitourinary:negative for dysuria, frequency and hematuria Hematologic/lymphatic: negative for bleeding, easy bruising and lymphadenopathy Musculoskeletal:negative for arthralgias, muscle weakness and stiff joints Neurological: negative for coordination problems, gait problems, headaches and weakness Endocrine: negative for diabetic  symptoms including polydipsia, polyuria and weight loss Allergic/Immunologic: negative for anaphylaxis, hay fever and urticaria  Past Medical History:  Diagnosis Date  . Anemia    H&H of 10.6/33 and 07/2008 and 11.9/35 and 09/2010  . Anxiety   . CHF (congestive heart failure) ([place])   . Depression with anxiety   . Diabetes mellitus without complication ([place])   . Enlarged heart   . Fasting hyperglycemia   . Hypertension    Lab: Normal BMet except glucose of 118 in 09/2010  . Hypertensive heart disease 2009   Pulmonary edema postpartum; mild to moderate mitral regurgitation when hospitalized for CHF in 2009; Echocardiogram in 12/2009-no MR and normal EF; normal CXR in 09/2010  . Migraine headache   . Miscarriage 03/19/2013  . Obesity 04/16/2009  . Osteoarthritis, knee 03/29/2011  . Preeclampsia   . Pregnant   . Pulmonary edema   . Sleep apnea   . Threatened abortion in early pregnancy 03/15/2013    Past Surgical History:  Procedure Laterality Date  . BREAST REDUCTION SURGERY  2002  . CARDIAC CATHETERIZATION N/A 12/22/2015   Procedure: Left Heart Cath and Coronary Angiography;  Surgeon: Peter M Martinique, MD;  Location: [place] [facility];  Service: Cardiovascular;  Laterality: N/A;  . CESAREAN SECTION N/A 04/09/2014   Procedure: CESAREAN SECTION;  Surgeon: Mora Bellman, MD;  Location: [place] [facility];  Service: Obstetrics;  Laterality: N/A;  . CHOLECYSTECTOMY       reports that she has never smoked. She has never used smokeless tobacco. She reports that she drinks alcohol. She reports that she does not use drugs.  Allergies  Allergen Reactions  . Diclofenac Swelling    AND POSSIBLE SYNCOPE  . Tramadol Nausea And Vomiting  . Vicodin [Hydrocodone-Acetaminophen] Nausea Only  Family History  Problem Relation Age of Onset  . Diabetes Mother   . Heart disease Mother   . Hyperlipidemia Paternal Grandfather   . Hypertension Paternal Grandfather   . Heart disease Father   . Heart disease  Maternal Grandmother   . ADD / ADHD Son   . Hypertension Maternal Uncle   . Heart attack Brother   . Sudden death Neg Hx     Prior to Admission medications   Medication Sig Start Date End Date Taking? Authorizing Provider  acetaminophen (TYLENOL) 500 MG tablet Take 1,000 mg by mouth every 6 (six) hours as needed.   Yes Historical Provider, MD  amLODipine (NORVASC) 5 MG tablet Take 2 tablets (10 mg total) by mouth daily. 02/24/15  Yes Lendon Colonel, NP  aspirin EC 81 MG tablet Take 81 mg by mouth daily.   Yes Historical Provider, MD  carvedilol (COREG) 12.5 MG tablet TAKE 3 TABLETS BY MOUTH TWICE DAILY WITH A MEAL. 08/21/15  Yes Arnoldo Lenis, MD  escitalopram (LEXAPRO) 20 MG tablet Take 1 tablet (20 mg total) by mouth daily. 03/26/16  Yes Estill Dooms, NP  furosemide (LASIX) 40 MG tablet Take 1 tablet (40 mg total) by mouth daily. 03/29/16  Yes Leslie K Sofia, PA-C  isosorbide-hydrALAZINE (BIDIL) 20-37.5 MG tablet Take 1 tablet by mouth 3 (three) times daily.   Yes Historical Provider, MD  linagliptin (TRADJENTA) 5 MG TABS tablet Take 5 mg by mouth daily.   Yes Historical Provider, MD  metFORMIN (GLUCOPHAGE) 500 MG tablet Take 1 tablet (500 mg total) by mouth 2 (two) times daily with a meal. 12/25/15  Yes Erlene Quan, PA-C  nitroGLYCERIN (NITROSTAT) 0.4 MG SL tablet Place 1 tablet (0.4 mg total) under the tongue every 5 (five) minutes as needed for chest pain. 09/05/15  Yes Kathie Dike, MD  ondansetron (ZOFRAN-ODT) 8 MG disintegrating tablet Take 1 tablet (8 mg total) by mouth every 8 (eight) hours as needed for nausea or vomiting. 10/07/15  Yes Fraser Din Focht, PA  potassium chloride SA (K-DUR,KLOR-CON) 20 MEQ tablet Take 2 tablets (40 mEq total) by mouth daily. 03/27/15  Yes Lendon Colonel, NP  pregabalin (LYRICA) 75 MG capsule Take 75 mg by mouth daily.   Yes Historical Provider, MD  sacubitril-valsartan (ENTRESTO) 49-51 MG Take 1 tablet by mouth 2 (two) times daily.  09/05/15  Yes Kathie Dike, MD  sitaGLIPtin (JANUVIA) 100 MG tablet Take 100 mg by mouth daily.   Yes Historical Provider, MD  spironolactone (ALDACTONE) 25 MG tablet Take 1 tablet (25 mg total) by mouth daily. 02/24/15  Yes Lendon Colonel, NP  promethazine (PHENERGAN) 12.5 MG tablet Take 1 tablet (12.5 mg total) by mouth every 6 (six) hours as needed for nausea or vomiting. Patient not taking: Reported on 04/03/2016 01/02/16   Lily Kocher, PA-C    Physical Exam:  Vitals:   04/03/16 1450 04/03/16 1515 04/03/16 1546 04/03/16 1645  BP: (!) 195/135  (!) 176/107 (!) 191/139  Pulse: 109  97 100  Resp: 20  18   Temp: 97.5 F (36.4 C)     TempSrc: Oral     SpO2: 100%  98% 99%  Weight:  113.8 kg (250 lb 12.8 oz)      Constitutional: NAD, calm, comfortable Eyes: PERRL, lids and conjunctivae normal ENMT: Mucous membranes are moist. Posterior pharynx clear of any exudate or lesions.Normal dentition.  Neck: normal, supple, no masses, no thyromegaly Respiratory: clear to auscultation bilaterally, no  wheezing, no crackles. Normal respiratory effort. No accessory muscle use.  Cardiovascular: Regular rate and rhythm, no murmurs / rubs / gallops. No extremity edema. 2+ pedal pulses. No carotid bruits.  Abdomen: no tenderness, no masses palpated. No hepatosplenomegaly. Bowel sounds positive.  Musculoskeletal: no clubbing / cyanosis. No joint deformity upper and lower extremities. Good ROM, no contractures. Normal muscle tone.  Skin: no rashes, lesions, ulcers. No induration Neurologic: CN 2-12 grossly intact. Sensation intact, DTR normal. Strength 5/5 in all 4.  Psychiatric: Normal judgment and insight. Alert and oriented x 3. Normal mood.   Labs on Admission: I have personally reviewed following labs and imaging studies  CBC:  Recent Labs Lab 03/29/16 1118 04/03/16 0823 04/03/16 1512  WBC 10.3 10.2 9.4  NEUTROABS 7.5 7.2  --   HGB 9.4* 9.6* 10.0*  HCT 31.2* 32.2* 33.2*  MCV 70.7*  70.8* 69.7*  PLT 260 285 0000000   Basic Metabolic Panel:  Recent Labs Lab 03/29/16 1118 04/03/16 0823 04/03/16 1512  NA 135 135 137  K 3.8 3.8 3.7  CL 100* 103 100*  CO2 29 25 28   GLUCOSE 276* 300* 296*  BUN 18 18 14   CREATININE 0.78 0.95 1.11*  CALCIUM 8.4* 8.8* 8.9   GFR: Estimated Creatinine Clearance: 86.3 mL/min (by C-G formula based on SCr of 1.11 mg/dL (H)). Liver Function Tests:  Recent Labs Lab 03/29/16 1118 04/03/16 0823  AST 35 56*  ALT 32 57*  ALKPHOS 77 78  BILITOT 0.5 0.4  PROT 6.3* 6.6  ALBUMIN 2.8* 2.9*   No results for input(s): LIPASE, AMYLASE in the last 168 hours. No results for input(s): AMMONIA in the last 168 hours. Coagulation Profile: No results for input(s): INR, PROTIME in the last 168 hours. Cardiac Enzymes:  Recent Labs Lab 03/29/16 1118 04/03/16 0823  TROPONINI <0.03 <0.03   BNP (last 3 results) No results for input(s): PROBNP in the last 8760 hours. HbA1C: No results for input(s): HGBA1C in the last 72 hours. CBG: No results for input(s): GLUCAP in the last 168 hours. Lipid Profile: No results for input(s): CHOL, HDL, LDLCALC, TRIG, CHOLHDL, LDLDIRECT in the last 72 hours. Thyroid Function Tests: No results for input(s): TSH, T4TOTAL, FREET4, T3FREE, THYROIDAB in the last 72 hours. Anemia Panel: No results for input(s): VITAMINB12, FOLATE, FERRITIN, TIBC, IRON, RETICCTPCT in the last 72 hours. Urine analysis:    Component Value Date/Time   COLORURINE YELLOW 12/08/2015 0045   APPEARANCEUR CLOUDY (A) 12/08/2015 0045   LABSPEC 1.029 12/08/2015 0045   PHURINE 5.5 12/08/2015 0045   GLUCOSEU 100 (A) 12/08/2015 0045   HGBUR NEGATIVE 12/08/2015 0045   BILIRUBINUR NEGATIVE 12/08/2015 0045   KETONESUR NEGATIVE 12/08/2015 0045   PROTEINUR 100 (A) 12/08/2015 0045   UROBILINOGEN 0.2 04/29/2014 1115   NITRITE NEGATIVE 12/08/2015 0045   LEUKOCYTESUR NEGATIVE 12/08/2015 0045   Sepsis Labs:  !!!!!!!!!!!!!!!!!!!!!!!!!!!!!!!!!!!!!!!!!!!! Invalid input(s): PROCALCITONIN, LACTICIDVEN No results found for this or any previous visit (from the past 240 hour(s)).   Radiological Exams on Admission: Dg Chest 2 View  Result Date: 04/03/2016 CLINICAL DATA:  Short of breath. CHF. Bilateral foot and ankle swelling. EXAM: CHEST  2 VIEW COMPARISON:  03/29/2016 FINDINGS: The heart is moderately enlarged. Vascular congestion. There are no Kerley B-lines. Tiny pleural effusions are suspected. No pneumothorax. No pleural effusion. IMPRESSION: Cardiomegaly with vascular congestion and tiny pleural effusions but no interstitial edema. This is compatible with mild volume overload. Electronically Signed   By: Marybelle Killings M.D.   On:  04/03/2016 09:10   Ct Chest Wo Contrast  Result Date: 04/03/2016 CLINICAL DATA:  Shortness of breath and coughing for 1 week. Lower extremity edema. Abnormal chest radiograph. EXAM: CT CHEST WITHOUT CONTRAST TECHNIQUE: Multidetector CT imaging of the chest was performed following the standard protocol without IV contrast. COMPARISON:  Chest radiograph on 04/03/2016 FINDINGS: Cardiovascular: Mild to moderate cardiomegaly. No evidence of pericardial effusion. Mediastinum/Nodes: No masses or pathologically enlarged lymph nodes identified on this unenhanced exam. Lungs/Pleura: Mild heterogeneous ground-glass opacity is seen in both lungs with mild patchy airspace disease in both lower lobes. This appears symmetric. Small right pleural effusion also noted. No evidence of pulmonary mass. Upper Abdomen:  Unremarkable. Musculoskeletal: No suspicious bone lesions or other significant abnormality. IMPRESSION: Cardiomegaly and small right pleural effusion. Symmetric mild pulmonary ground-glass opacity and patchy airspace disease in both lower lobes, suspicious for pulmonary edema with pneumonia considered less likely. No mass or lymphadenopathy identified. Electronically Signed   By: Earle Gell  M.D.   On: 04/03/2016 10:24    EKG: Independently reviewed.   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 -CT was done earlier at [facility] showed groundglass appearance consistent with pulmonary edema. -Heart failure medications restarted including BiDil, Entresto and Coreg. -Diurese with IV Lasix 40 mg started twice a day. -Elevated legs, restrict fluid and salt intake, follow daily weights and renal function.  Hypertensive emergency  -Blood pressure 190/120 upon presentation to the ED, started on nitroglycerin drip. -This is likely secondary to acute CHF plus patient was not able to take her medication for the past several days. -We'll wean nitroglycerin drip as soon as possible, start oral medications.  Pulmonary edema -Secondary to acute CHF, diurese patient, follow intake/output.  Nausea and vomiting -Patient blood pressure was 126/86 when she presented to the ED on Monday. -She did have nausea and vomiting back then, so nausea vomiting likely not secondary to elevated blood pressure. -She feels much better after receiving Zofran, will allow her to eat. Likely transient gastroenteritis.  DM 2 -Uncontrolled DM II, hemoglobin A1c on 03/11/2016 was 9.8 (this is correlate with mean plasma glucose of 235. -Hold oral medications, start on carbohydrate modified diet and SSI.  OSA -Patient advised about weight loss, CPAP daily at bedtime.   DVT prophylaxis: SQ Heparin Code Status: Full code Family Communication: Plan D/W patient Disposition Plan: Home Consults called: Likely would consult  cardiology in a.m. Admission status: Admit to stepdown, inpatient   [facility] A MD [facility] Pager [phone]  If 7PM-7AM, please contact night-coverage [email or web address] Password [facility]  04/03/2016, 5:54 PM

## 2016-04-03 NOTE — ED Notes (Signed)
Patient transported to X-ray 

## 2016-04-04 DIAGNOSIS — I161 Hypertensive emergency: Secondary | ICD-10-CM

## 2016-04-04 DIAGNOSIS — I43 Cardiomyopathy in diseases classified elsewhere: Secondary | ICD-10-CM

## 2016-04-04 DIAGNOSIS — I119 Hypertensive heart disease without heart failure: Secondary | ICD-10-CM

## 2016-04-04 DIAGNOSIS — I5043 Acute on chronic combined systolic (congestive) and diastolic (congestive) heart failure: Secondary | ICD-10-CM

## 2016-04-04 DIAGNOSIS — J811 Chronic pulmonary edema: Secondary | ICD-10-CM

## 2016-04-04 DIAGNOSIS — E119 Type 2 diabetes mellitus without complications: Secondary | ICD-10-CM

## 2016-04-04 LAB — CBC
HCT: 32 % — ABNORMAL LOW (ref 36.0–46.0)
Hemoglobin: 9.6 g/dL — ABNORMAL LOW (ref 12.0–15.0)
MCH: 20.9 pg — ABNORMAL LOW (ref 26.0–34.0)
MCHC: 30 g/dL (ref 30.0–36.0)
MCV: 69.6 fL — ABNORMAL LOW (ref 78.0–100.0)
PLATELETS: 294 10*3/uL (ref 150–400)
RBC: 4.6 MIL/uL (ref 3.87–5.11)
RDW: 17.1 % — AB (ref 11.5–15.5)
WBC: 9.5 10*3/uL (ref 4.0–10.5)

## 2016-04-04 LAB — GLUCOSE, CAPILLARY
GLUCOSE-CAPILLARY: 148 mg/dL — AB (ref 65–99)
GLUCOSE-CAPILLARY: 210 mg/dL — AB (ref 65–99)
GLUCOSE-CAPILLARY: 238 mg/dL — AB (ref 65–99)
GLUCOSE-CAPILLARY: 282 mg/dL — AB (ref 65–99)
Glucose-Capillary: 235 mg/dL — ABNORMAL HIGH (ref 65–99)

## 2016-04-04 LAB — BASIC METABOLIC PANEL
Anion gap: 11 (ref 5–15)
BUN: 12 mg/dL (ref 6–20)
CALCIUM: 8.2 mg/dL — AB (ref 8.9–10.3)
CO2: 26 mmol/L (ref 22–32)
CREATININE: 0.89 mg/dL (ref 0.44–1.00)
Chloride: 101 mmol/L (ref 101–111)
GFR calc Af Amer: >60 mL/min (ref >60)
GLUCOSE: 189 mg/dL — AB (ref 65–99)
Potassium: 3.4 mmol/L — ABNORMAL LOW (ref 3.5–5.1)
SODIUM: 138 mmol/L (ref 135–145)

## 2016-04-04 LAB — MRSA PCR SCREENING: MRSA BY PCR: POSITIVE — AB

## 2016-04-04 MED ORDER — INSULIN ASPART 100 UNIT/ML ~~LOC~~ SOLN
5.0000 [IU] | Freq: Once | SUBCUTANEOUS | Status: AC
  Administered 2016-04-04: 5 [IU] via SUBCUTANEOUS

## 2016-04-04 MED ORDER — INSULIN ASPART 100 UNIT/ML ~~LOC~~ SOLN
4.0000 [IU] | Freq: Once | SUBCUTANEOUS | Status: AC
  Administered 2016-04-04: 4 [IU] via SUBCUTANEOUS

## 2016-04-04 MED ORDER — OXYCODONE-ACETAMINOPHEN 5-325 MG PO TABS
1.0000 | ORAL_TABLET | Freq: Four times a day (QID) | ORAL | Status: DC | PRN
  Administered 2016-04-04: 1 via ORAL
  Administered 2016-04-04 – 2016-04-08 (×13): 2 via ORAL
  Administered 2016-04-09: 1 via ORAL
  Administered 2016-04-09: 2 via ORAL
  Filled 2016-04-04 (×5): qty 2
  Filled 2016-04-04: qty 1
  Filled 2016-04-04: qty 2
  Filled 2016-04-04: qty 1
  Filled 2016-04-04 (×2): qty 2
  Filled 2016-04-04: qty 1
  Filled 2016-04-04: qty 2
  Filled 2016-04-04: qty 1
  Filled 2016-04-04 (×5): qty 2

## 2016-04-04 MED ORDER — MUPIROCIN 2 % EX OINT
1.0000 "application " | TOPICAL_OINTMENT | Freq: Two times a day (BID) | CUTANEOUS | Status: AC
  Administered 2016-04-04 – 2016-04-07 (×9): 1 via NASAL
  Filled 2016-04-04: qty 22

## 2016-04-04 MED ORDER — BUTALBITAL-APAP-CAFFEINE 50-325-40 MG PO TABS
1.0000 | ORAL_TABLET | ORAL | Status: AC
  Administered 2016-04-04: 1 via ORAL
  Filled 2016-04-04: qty 1

## 2016-04-04 MED ORDER — INSULIN GLARGINE 100 UNIT/ML ~~LOC~~ SOLN
10.0000 [IU] | Freq: Every day | SUBCUTANEOUS | Status: DC
  Administered 2016-04-04 – 2016-04-05 (×2): 10 [IU] via SUBCUTANEOUS
  Filled 2016-04-04 (×2): qty 0.1

## 2016-04-04 MED ORDER — POTASSIUM CHLORIDE CRYS ER 20 MEQ PO TBCR
40.0000 meq | EXTENDED_RELEASE_TABLET | Freq: Once | ORAL | Status: AC
  Administered 2016-04-04: 40 meq via ORAL
  Filled 2016-04-04: qty 2

## 2016-04-04 MED ORDER — MUPIROCIN 2 % EX OINT
TOPICAL_OINTMENT | CUTANEOUS | Status: AC
  Administered 2016-04-04: 01:00:00
  Filled 2016-04-04: qty 22

## 2016-04-04 MED ORDER — CHLORHEXIDINE GLUCONATE CLOTH 2 % EX PADS
6.0000 | MEDICATED_PAD | Freq: Every day | CUTANEOUS | Status: AC
  Administered 2016-04-04 – 2016-04-06 (×3): 6 via TOPICAL

## 2016-04-04 NOTE — Progress Notes (Signed)
PROGRESS NOTE  [name]  [id number] DOB: 1975-06-08 DOA: 04/03/2016 PCP: Rosita Fire, MD Outpatient Specialists:  Subjective: Feels much better, good urine output, will follow daily weight.  Brief Narrative:  Chelsey Santiago is a 40 y.o. female with medical history significant of combined CHF, HTN, DM 2 and OSA came to the hospital complaining about SOB. Patient reported DOE, orthopnea, PND and lower extremity edema started on Monday, she was evaluated in the ED had been on diuretics was switched to Lasix, patient also has some nausea and vomiting so was not able to keep medications down. She came back today to [facility] ED and was admitted to the hospital, she asked staff to transfer her to [facility] as she does not want to be admitted to [facility], her mother died over there. Patient left AMA when she was told she will not be admitted to [facility].  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 -CT was done earlier at [facility] showed groundglass appearance consistent with pulmonary edema. -Heart failure medications restarted including BiDil, Entresto, Coreg and Aldactone -Diurese with IV Lasix 40 mg started twice a day. -Elevated legs, restrict fluid and salt intake, follow daily weights and renal function. -I will ask the heart failure team to see her in the morning to adjust medications prior to discharge.  Hypertensive emergency  -Blood pressure 190/120 upon presentation to the ED, started on nitroglycerin drip. -This is likely secondary to acute CHF plus patient was not  able to take her medication for the past several days. -Was on nitroglycerin drip, had tremendous headache with that, this was discontinued on 11/19 AM.  Pulmonary edema -Secondary to acute CHF, diurese patient, follow intake/output.  Nausea and vomiting -Patient blood pressure was 126/86 when she presented to the ED on Monday. -She did have nausea and vomiting back then, so nausea vomiting likely not secondary to elevated blood pressure. -This is resolved, Likely transient gastroenteritis.  DM 2 -Uncontrolled DM II, hemoglobin A1c on 03/11/2016 was 9.8 (this is correlate with mean plasma glucose of 235). -Hold oral medications, start on carbohydrate modified diet and SSI.  OSA -Patient advised about weight loss, CPAP daily at bedtime.  Anemia -Chronic microcytic anemia, I'll check iron and ferritin dishes likely iron deficiency anemia.  Hypokalemia -This is secondary to diuresis, replete with oral supplements.   DVT prophylaxis: Subcutaneous heparin Code Status: Full Code Family Communication:  Disposition Plan:  Diet: Diet heart healthy/carb modified Room service appropriate? Yes; Fluid consistency: Thin  Consultants:   None  Procedures:   None  Antimicrobials:   None  Objective: Vitals:   04/04/16 0347 04/04/16 0400 04/04/16 0554 04/04/16 0648  BP: (!) 201/127 (!) 167/117    Pulse:  100 99   Resp: 17 18 16    Temp:    97.4 F (36.3 C)  TempSrc:    Axillary  SpO2:  97% 98%   Weight:  110.5 kg (243 lb 11.2 oz)    Height:        Intake/Output Summary (Last 24 hours) at 04/04/16 1028 Last data filed at 04/04/16  [id number]  Gross per 24 hour  Intake               50 ml  Output             1700 ml  Net            -1650 ml   Filed Weights   04/03/16 1515 04/03/16 1940 04/04/16 0400  Weight: 113.8 kg (250 lb 12.8 oz) 112.9 kg (248 lb 14.4 oz) 110.5 kg (243 lb 11.2 oz)    Examination: General exam: Appears calm and comfortable  Respiratory system: Clear  to auscultation. Respiratory effort normal. Cardiovascular system: S1 & S2 heard, RRR. No JVD, murmurs, rubs, gallops or clicks. No pedal edema. Gastrointestinal system: Abdomen is nondistended, soft and nontender. No organomegaly or masses felt. Normal bowel sounds heard. Central nervous system: Alert and oriented. No focal neurological deficits. Extremities: Symmetric 5 x 5 power. Skin: No rashes, lesions or ulcers Psychiatry: Judgement and insight appear normal. Mood & affect appropriate.   Data Reviewed: I have personally reviewed following labs and imaging studies  CBC:  Recent Labs Lab 03/29/16 1118 04/03/16 0823 04/03/16 1512 04/04/16 0337  WBC 10.3 10.2 9.4 9.5  NEUTROABS 7.5 7.2  --   --   HGB 9.4* 9.6* 10.0* 9.6*  HCT 31.2* 32.2* 33.2* 32.0*  MCV 70.7* 70.8* 69.7* 69.6*  PLT 260 285 278 [id number]   Basic Metabolic Panel:  Recent Labs Lab 03/29/16 1118 04/03/16 0823 04/03/16 1512 04/04/16 0337  NA 135 135 137 138  K 3.8 3.8 3.7 3.4*  CL 100* 103 100* 101  CO2 29 25 28 26   GLUCOSE 276* 300* 296* 189*  BUN 18 18 14 12   CREATININE 0.78 0.95 1.11* 0.89  CALCIUM 8.4* 8.8* 8.9 8.2*   GFR: Estimated Creatinine Clearance: 105.9 mL/min (by C-G formula based on SCr of 0.89 mg/dL). Liver Function Tests:  Recent Labs Lab 03/29/16 1118 04/03/16 0823  AST 35 56*  ALT 32 57*  ALKPHOS 77 78  BILITOT 0.5 0.4  PROT 6.3* 6.6  ALBUMIN 2.8* 2.9*   No results for input(s): LIPASE, AMYLASE in the last 168 hours. No results for input(s): AMMONIA in the last 168 hours. Coagulation Profile:  Recent Labs Lab 04/03/16 2032  INR 1.09   Cardiac Enzymes:  Recent Labs Lab 03/29/16 1118 04/03/16 0823  TROPONINI <0.03 <0.03   BNP (last 3 results) No results for input(s): PROBNP in the last 8760 hours. HbA1C: No results for input(s): HGBA1C in the last 72 hours. CBG:  Recent Labs Lab 04/03/16 1950 04/04/16 0006 04/04/16 0830  GLUCAP 260* 238* 210*   Lipid  Profile: No results for input(s): CHOL, HDL, LDLCALC, TRIG, CHOLHDL, LDLDIRECT in the last 72 hours. Thyroid Function Tests:  Recent Labs  04/03/16 2032  TSH 0.835   Anemia Panel: No results for input(s): VITAMINB12, FOLATE, FERRITIN, TIBC, IRON, RETICCTPCT in the last 72 hours. Urine analysis:    Component Value Date/Time   COLORURINE YELLOW 12/08/2015 0045   APPEARANCEUR CLOUDY (A) 12/08/2015 0045   LABSPEC 1.029 12/08/2015 0045   PHURINE 5.5 12/08/2015 0045   GLUCOSEU 100 (A) 12/08/2015 0045   HGBUR NEGATIVE 12/08/2015 0045   BILIRUBINUR NEGATIVE 12/08/2015 0045   KETONESUR NEGATIVE 12/08/2015 0045   PROTEINUR 100 (A) 12/08/2015 0045   UROBILINOGEN 0.2 04/29/2014 1115   NITRITE NEGATIVE 12/08/2015 0045   LEUKOCYTESUR NEGATIVE 12/08/2015 0045   Sepsis Labs: @LABRCNTIP (procalcitonin:4,lacticidven:4)  ) Recent Results (from the past 240 hour(s))  MRSA  PCR Screening     Status: Abnormal   Collection Time: 04/03/16  8:22 PM  Result Value Ref Range Status   MRSA by PCR POSITIVE (A) NEGATIVE Final    Comment:        The GeneXpert MRSA Assay (FDA approved for NASAL specimens only), is one component of a comprehensive MRSA colonization surveillance program. It is not intended to diagnose MRSA infection nor to guide or monitor treatment for MRSA infections. RESULT CALLED TO, READ BACK BY AND VERIFIED WITH:  TO BAVERY(RN) BY TCLEVELAND 04/04/16 AT 1:14AM      Invalid input(s): PROCALCITONIN, LACTICACIDVEN   Radiology Studies: Dg Chest 2 View  Result Date: 04/03/2016 CLINICAL DATA:  Short of breath. CHF. Bilateral foot and ankle swelling. EXAM: CHEST  2 VIEW COMPARISON:  03/29/2016 FINDINGS: The heart is moderately enlarged. Vascular congestion. There are no Kerley B-lines. Tiny pleural effusions are suspected. No pneumothorax. No pleural effusion. IMPRESSION: Cardiomegaly with vascular congestion and tiny pleural effusions but no interstitial edema. This is  compatible with mild volume overload. Electronically Signed   By: Marybelle Killings M.D.   On: 04/03/2016 09:10   Ct Chest Wo Contrast  Result Date: 04/03/2016 CLINICAL DATA:  Shortness of breath and coughing for 1 week. Lower extremity edema. Abnormal chest radiograph. EXAM: CT CHEST WITHOUT CONTRAST TECHNIQUE: Multidetector CT imaging of the chest was performed following the standard protocol without IV contrast. COMPARISON:  Chest radiograph on 04/03/2016 FINDINGS: Cardiovascular: Mild to moderate cardiomegaly. No evidence of pericardial effusion. Mediastinum/Nodes: No masses or pathologically enlarged lymph nodes identified on this unenhanced exam. Lungs/Pleura: Mild heterogeneous ground-glass opacity is seen in both lungs with mild patchy airspace disease in both lower lobes. This appears symmetric. Small right pleural effusion also noted. No evidence of pulmonary mass. Upper Abdomen:  Unremarkable. Musculoskeletal: No suspicious bone lesions or other significant abnormality. IMPRESSION: Cardiomegaly and small right pleural effusion. Symmetric mild pulmonary ground-glass opacity and patchy airspace disease in both lower lobes, suspicious for pulmonary edema with pneumonia considered less likely. No mass or lymphadenopathy identified. Electronically Signed   By: Earle Gell M.D.   On: 04/03/2016 10:24        Scheduled Meds: . aspirin EC  81 mg Oral Daily  . carvedilol  12.5 mg Oral BID WC  . Chlorhexidine Gluconate Cloth  6 each Topical Q0600  . escitalopram  20 mg Oral Daily  . furosemide  40 mg Intravenous Q12H  . heparin  5,000 Units Subcutaneous Q8H  . Influenza vac split quadrivalent PF  0.5 mL Intramuscular Tomorrow-1000  . insulin aspart  0-15 Units Subcutaneous TID WC  . isosorbide-hydrALAZINE  1 tablet Oral TID  . mupirocin ointment  1 application Nasal BID  . pregabalin  75 mg Oral QHS  . sacubitril-valsartan  1 tablet Oral BID  . sodium chloride flush  3 mL Intravenous Q12H  .  spironolactone  25 mg Oral Daily   Continuous Infusions:   LOS: 1 day    Time spent: 35 minutes    Doloras Tellado A, MD [facility] Pager [phone]  If 7PM-7AM, please contact night-coverage [email or web address] Password [email or web address] 04/04/2016, 10:28 AM

## 2016-04-04 NOTE — Progress Notes (Signed)
Smith MD notified of patient's unstable B/p on the nitroglycerin drip and increasing headache. MD ordered not to titrate drip and placed a one time order of butablbital-acetaminophen-caffeine (fioricet) 50-325mg  and to give an early dose of 40mg  lasix.

## 2016-04-05 ENCOUNTER — Inpatient Hospital Stay (HOSPITAL_COMMUNITY): Payer: Medicaid Other

## 2016-04-05 ENCOUNTER — Encounter (HOSPITAL_COMMUNITY): Payer: Self-pay | Admitting: Radiology

## 2016-04-05 LAB — GLUCOSE, CAPILLARY
Glucose-Capillary: 255 mg/dL — ABNORMAL HIGH (ref 65–99)
Glucose-Capillary: 215 mg/dL — ABNORMAL HIGH (ref 65–99)
Glucose-Capillary: 211 mg/dL — ABNORMAL HIGH (ref 65–99)
Glucose-Capillary: 235 mg/dL — ABNORMAL HIGH (ref 65–99)

## 2016-04-05 LAB — BASIC METABOLIC PANEL
ANION GAP: 10 (ref 5–15)
BUN: 18 mg/dL (ref 6–20)
CHLORIDE: 96 mmol/L — AB (ref 101–111)
CO2: 30 mmol/L (ref 22–32)
CREATININE: 1.06 mg/dL — AB (ref 0.44–1.00)
Calcium: 8.4 mg/dL — ABNORMAL LOW (ref 8.9–10.3)
GFR calc non Af Amer: >60 mL/min (ref >60)
GLUCOSE: 153 mg/dL — AB (ref 65–99)
Potassium: 3.7 mmol/L (ref 3.5–5.1)
Sodium: 136 mmol/L (ref 135–145)

## 2016-04-05 LAB — CBC
HCT: 34.7 % — ABNORMAL LOW (ref 36.0–46.0)
HEMOGLOBIN: 10.3 g/dL — AB (ref 12.0–15.0)
MCH: 20.9 pg — AB (ref 26.0–34.0)
MCHC: 29.7 g/dL — ABNORMAL LOW (ref 30.0–36.0)
MCV: 70.2 fL — AB (ref 78.0–100.0)
Platelets: 351 10*3/uL (ref 150–400)
RBC: 4.94 MIL/uL (ref 3.87–5.11)
RDW: 17.3 % — ABNORMAL HIGH (ref 11.5–15.5)
WBC: 10 10*3/uL (ref 4.0–10.5)

## 2016-04-05 LAB — HEMOGLOBIN A1C
HEMOGLOBIN A1C: 10.3 % — AB (ref 4.8–5.6)
Mean Plasma Glucose: 249 mg/dL

## 2016-04-05 LAB — MAGNESIUM: Magnesium: 1.8 mg/dL (ref 1.7–2.4)

## 2016-04-05 MED ORDER — POTASSIUM CHLORIDE CRYS ER 20 MEQ PO TBCR: 40.0000 meq | EXTENDED_RELEASE_TABLET | Freq: Every day | ORAL | Status: DC

## 2016-04-05 MED ORDER — INSULIN GLARGINE 100 UNIT/ML ~~LOC~~ SOLN
5.0000 [IU] | Freq: Once | SUBCUTANEOUS | Status: AC
  Administered 2016-04-05: 5 [IU] via SUBCUTANEOUS
  Filled 2016-04-05: qty 0.05

## 2016-04-05 MED ORDER — FUROSEMIDE 10 MG/ML IJ SOLN
40.0000 mg | Freq: Three times a day (TID) | INTRAMUSCULAR | Status: DC
  Administered 2016-04-05 (×3): 40 mg via INTRAVENOUS
  Filled 2016-04-05 (×3): qty 4

## 2016-04-05 MED ORDER — INSULIN GLARGINE 100 UNIT/ML ~~LOC~~ SOLN
15.0000 [IU] | Freq: Every day | SUBCUTANEOUS | Status: DC
  Administered 2016-04-06 – 2016-04-09 (×4): 15 [IU] via SUBCUTANEOUS
  Filled 2016-04-05 (×4): qty 0.15

## 2016-04-05 MED ORDER — DIAZEPAM 2 MG PO TABS
2.0000 mg | ORAL_TABLET | Freq: Once | ORAL | Status: AC
  Administered 2016-04-05: 2 mg via ORAL
  Filled 2016-04-05 (×2): qty 1

## 2016-04-05 MED ORDER — POTASSIUM CHLORIDE CRYS ER 20 MEQ PO TBCR
40.0000 meq | EXTENDED_RELEASE_TABLET | Freq: Two times a day (BID) | ORAL | Status: DC
  Administered 2016-04-05 – 2016-04-08 (×7): 40 meq via ORAL
  Filled 2016-04-05 (×7): qty 2

## 2016-04-05 MED ORDER — GADOBENATE DIMEGLUMINE 529 MG/ML IV SOLN
35.0000 mL | Freq: Once | INTRAVENOUS | Status: AC | PRN
  Administered 2016-04-05: 35 mL via INTRAVENOUS

## 2016-04-05 NOTE — Progress Notes (Addendum)
Inpatient Diabetes Program Recommendations  AACE/ADA: New Consensus Statement on Inpatient Glycemic Control (2015)  Target Ranges:  Prepandial:   less than 140 mg/dL      Peak postprandial:   less than 180 mg/dL (1-2 hours)      Critically ill patients:  140 - 180 mg/dL   Lab Results  Component Value Date   GLUCAP 255 (H) 04/05/2016   HGBA1C 10.3 (H) 04/03/2016    Review of Glycemic Control  Diabetes history: DM 2 Outpatient Diabetes medications: Metformin 500 BID, Januvia 100 mg Daily Current orders for Inpatient glycemic control: Lantus 10 units, Novolog Moderate TID  Inpatient Diabetes Program Recommendations:   Glucose elevated in the 200's after Lantus 10 units. Please consider increasing basal insulin to Lantus 16 units. May want to give additional units today.  Thanks,  Tama Headings RN, MSN, [facility] Inpatient Diabetes Coordinator Team Pager [phone] (8a-5p)

## 2016-04-05 NOTE — Progress Notes (Signed)
PROGRESS NOTE  Chelsey Santiago  [id number] DOB: [birth year] DOA: 04/03/2016 PCP: Rosita Fire, MD Outpatient Specialists:  Subjective: Feels much better, headache with right-sided neck pain. Blood pressure is controlled now. CBG still elevated.  Brief Narrative:  Chelsey Santiago is a 40 y.o. female with medical history significant of combined CHF, HTN, DM 2 and OSA came to the hospital complaining about SOB. Patient reported DOE, orthopnea, PND and lower extremity edema started on Monday, she was evaluated in the ED had been on diuretics was switched to Lasix, patient also has some nausea and vomiting so was not able to keep medications down. She came back today to [facility] ED and was admitted to the hospital, she asked staff to transfer her to [facility] as she does not want to be admitted to [facility], her mother died over there. Patient left AMA when she was told she will not be admitted to [facility]  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 -CT was done earlier at [facility] showed groundglass appearance consistent with pulmonary edema. -Heart failure medications restarted including BiDil, Entresto, Coreg and Aldactone -Diurese with IV Lasix 40 mg started twice a day. Good response, lost 10 pounds since admission -Elevated legs, restrict fluid and salt intake, follow daily weights and renal function. -Heart failure team to adjust medications, she is on multiple meds, likely she will be discharged in a.m.  Hypertensive emergency  -Blood pressure 190/120 upon presentation to the  ED, started on nitroglycerin drip. -This is likely secondary to acute CHF plus patient was not able to take her medication for the past several days. -Was on nitroglycerin drip, had tremendous headache with that, this was discontinued on 11/19 AM. -Blood pressure is controlled, continue CHF medications.  Pulmonary edema -Secondary to acute CHF, diurese patient, follow intake/output.  Nausea and vomiting -Patient blood pressure was 126/86 when she presented to the ED on Monday. -She did have nausea and vomiting back then, so nausea vomiting likely not secondary to elevated blood pressure. -This is resolved, Likely transient gastroenteritis.  DM 2 -Uncontrolled DM II, hemoglobin A1c on 03/11/2016 was 9.8 (this is correlate with mean plasma glucose of 235). -Hold oral medications, start on carbohydrate modified diet and SSI.  OSA -Patient advised about weight loss, CPAP daily at bedtime.  Anemia -Chronic microcytic anemia, I'll check iron and ferritin dishes likely iron deficiency anemia.  Hypokalemia -This is secondary to diuresis, replete with oral supplements.   DVT prophylaxis: Subcutaneous heparin Code Status: Full Code Family Communication:  Disposition Plan: Transfer to telemetry. Diet: Diet heart healthy/carb modified Room service appropriate? Yes; Fluid consistency: Thin  Consultants:   None  Procedures:   None  Antimicrobials:   None  Objective: Vitals:   04/05/16 0400 04/05/16 0424 04/05/16 0524 04/05/16 0819  BP: 116/78   (!) 128/95  Pulse: 83  88 81  Resp: 14  14 (!) 25  Temp: 97.9 F (36.6 C)   97.5 F (36.4 C)  TempSrc: Axillary   Oral  SpO2: 97%  100% 99%  Weight:  108.9 kg (240 lb)  Height:        Intake/Output Summary (Last 24 hours) at 04/05/16 1115 Last data filed at 04/05/16 0700  Gross per 24 hour  Intake              720 ml  Output             1800 ml  Net            -1080 ml   Filed Weights   04/03/16 1940 04/04/16 0400  04/05/16 0424  Weight: 112.9 kg (248 lb 14.4 oz) 110.5 kg (243 lb 11.2 oz) 108.9 kg (240 lb)    Examination: General exam: Appears calm and comfortable  Respiratory system: Clear to auscultation. Respiratory effort normal. Cardiovascular system: S1 & S2 heard, RRR. No JVD, murmurs, rubs, gallops or clicks. No pedal edema. Gastrointestinal system: Abdomen is nondistended, soft and nontender. No organomegaly or masses felt. Normal bowel sounds heard. Central nervous system: Alert and oriented. No focal neurological deficits. Extremities: Symmetric 5 x 5 power. Skin: No rashes, lesions or ulcers Psychiatry: Judgement and insight appear normal. Mood & affect appropriate.   Data Reviewed: I have personally reviewed following labs and imaging studies  CBC:  Recent Labs Lab 03/29/16 1118 04/03/16 0823 04/03/16 1512 04/04/16 0337 04/05/16 0208  WBC 10.3 10.2 9.4 9.5 10.0  NEUTROABS 7.5 7.2  --   --   --   HGB 9.4* 9.6* 10.0* 9.6* 10.3*  HCT 31.2* 32.2* 33.2* 32.0* 34.7*  MCV 70.7* 70.8* 69.7* 69.6* 70.2*  PLT 260 285 278 294 [id number]   Basic Metabolic Panel:  Recent Labs Lab 03/29/16 1118 04/03/16 0823 04/03/16 1512 04/04/16 0337 04/05/16 0208 04/05/16 0735  NA 135 135 137 138 136  --   K 3.8 3.8 3.7 3.4* 3.7  --   CL 100* 103 100* 101 96*  --   CO2 29 25 28 26 30   --   GLUCOSE 276* 300* 296* 189* 153*  --   BUN 18 18 14 12 18   --   CREATININE 0.78 0.95 1.11* 0.89 1.06*  --   CALCIUM 8.4* 8.8* 8.9 8.2* 8.4*  --   MG  --   --   --   --   --  1.8   GFR: Estimated Creatinine Clearance: 88.1 mL/min (by C-G formula based on SCr of 1.06 mg/dL (H)). Liver Function Tests:  Recent Labs Lab 03/29/16 1118 04/03/16 0823  AST 35 56*  ALT 32 57*  ALKPHOS 77 78  BILITOT 0.5 0.4  PROT 6.3* 6.6  ALBUMIN 2.8* 2.9*   No results for input(s): LIPASE, AMYLASE in the last 168 hours. No results for input(s): AMMONIA in the last 168 hours. Coagulation Profile:  Recent Labs Lab  04/03/16 2032  INR 1.09   Cardiac Enzymes:  Recent Labs Lab 03/29/16 1118 04/03/16 0823  TROPONINI <0.03 <0.03   BNP (last 3 results) No results for input(s): PROBNP in the last 8760 hours. HbA1C:  Recent Labs  04/03/16 2032  HGBA1C 10.3*   CBG:  Recent Labs Lab 04/04/16 0830 04/04/16 1134 04/04/16 1715 04/04/16 2051 04/05/16 0818  GLUCAP 210* 282* 148* 235* 255*   Lipid Profile: No results for input(s): CHOL, HDL, LDLCALC, TRIG, CHOLHDL, LDLDIRECT in the last 72 hours. Thyroid Function Tests:  Recent Labs  04/03/16 2032  TSH 0.835   Anemia Panel: No results for input(s): VITAMINB12, FOLATE, FERRITIN, TIBC, IRON, RETICCTPCT in the last 72 hours. Urine analysis:    Component Value Date/Time  COLORURINE YELLOW 12/08/2015 0045   APPEARANCEUR CLOUDY (A) 12/08/2015 0045   LABSPEC 1.029 12/08/2015 0045   PHURINE 5.5 12/08/2015 0045   GLUCOSEU 100 (A) 12/08/2015 0045   HGBUR NEGATIVE 12/08/2015 0045   BILIRUBINUR NEGATIVE 12/08/2015 0045   KETONESUR NEGATIVE 12/08/2015 0045   PROTEINUR 100 (A) 12/08/2015 0045   UROBILINOGEN 0.2 04/29/2014 1115   NITRITE NEGATIVE 12/08/2015 0045   LEUKOCYTESUR NEGATIVE 12/08/2015 0045   Sepsis Labs: @LABRCNTIP (procalcitonin:4,lacticidven:4)  ) Recent Results (from the past 240 hour(s))  MRSA PCR Screening     Status: Abnormal   Collection Time: 04/03/16  8:22 PM  Result Value Ref Range Status   MRSA by PCR POSITIVE (A) NEGATIVE Final    Comment:        The GeneXpert MRSA Assay (FDA approved for NASAL specimens only), is one component of a comprehensive MRSA colonization surveillance program. It is not intended to diagnose MRSA infection nor to guide or monitor treatment for MRSA infections. RESULT CALLED TO, READ BACK BY AND VERIFIED WITH:  TO BAVERY(RN) BY TCLEVELAND 04/04/16 AT 1:14AM      Invalid input(s): PROCALCITONIN, LACTICACIDVEN   Radiology Studies: No results found.      Scheduled  Meds: . aspirin EC  81 mg Oral Daily  . carvedilol  12.5 mg Oral BID WC  . Chlorhexidine Gluconate Cloth  6 each Topical Q0600  . escitalopram  20 mg Oral Daily  . furosemide  40 mg Intravenous TID  . heparin  5,000 Units Subcutaneous Q8H  . Influenza vac split quadrivalent PF  0.5 mL Intramuscular Tomorrow-1000  . insulin aspart  0-15 Units Subcutaneous TID WC  . [START ON 04/06/2016] insulin glargine  15 Units Subcutaneous Daily  . insulin glargine  5 Units Subcutaneous Once  . isosorbide-hydrALAZINE  1 tablet Oral TID  . mupirocin ointment  1 application Nasal BID  . potassium chloride  40 mEq Oral BID  . pregabalin  75 mg Oral QHS  . sacubitril-valsartan  1 tablet Oral BID  . sodium chloride flush  3 mL Intravenous Q12H  . spironolactone  25 mg Oral Daily   Continuous Infusions:   LOS: 2 days    Time spent: 35 minutes    Shuntae Herzig A, MD [facility] Pager [phone]  If 7PM-7AM, please contact night-coverage [email or web address] Password [email or web address] 04/05/2016, 11:15 AM

## 2016-04-05 NOTE — Progress Notes (Signed)
Inpatient Diabetes Program Recommendations  AACE/ADA: New Consensus Statement on Inpatient Glycemic Control (2015)  Target Ranges:  Prepandial:   less than 140 mg/dL      Peak postprandial:   less than 180 mg/dL (1-2 hours)      Critically ill patients:  140 - 180 mg/dL   Lab Results  Component Value Date   GLUCAP 255 (H) 04/05/2016   HGBA1C 10.3 (H) 04/03/2016   Spoke with patient about diabetes and home regimen for diabetes control. Patient reports that she is followed by her PCP Dr. Legrand Rams for diabetes management. She is considering requesting a referral for an Endocrinology referral for management. Patient reports Glucose in the 200's on Metformin and Januvia. Discussed glucose and A1C goals. Discussed importance of checking CBGs and maintaining good CBG control to prevent long-term and short-term complications. Explained how hyperglycemia leads to damage within blood vessels which lead to the common complications seen with uncontrolled diabetes. Patient had "bad knees". Discussed chair exercises with patient. Discussed impact of nutrition, exercise, stress, sickness, and medications on diabetes control. Patient had questions about diet. Patient still drinking regular drinks. Discussed carbohydrates, carbohydrate goals per day and meal, along with portion sizes. Will give DM meal planning guide to patient. Patient verbalized understanding of information discussed and has no further questions at this time related to diabetes.   Thanks,  Tama Headings RN, MSN, [facility] Inpatient Diabetes Coordinator Team Pager [phone] (8a-5p)

## 2016-04-05 NOTE — Care Management Note (Addendum)
Case Management Note  Patient Details  Name: Chelsey Santiago MRN: [id number] Date of Birth: [birth year]  Subjective/Objective:       CHF, HTN, DM 2             Action/Plan: Discharge Planning:  NCM spoke to pt and states she lives at home with small children. Has an adult Parke Simmers # [phone] that can assist at home and drive her to her appts as needed. Provided pt with Living Well with HF booklet. States she has a scale at home. And cooks majority of her meals at home monitoring her sodium. Referral to CHF RN and diabetes teaching. Offered choice for HH/list provided. Pt may benefit from CHF Disease Management RN.    PCP Rosita Fire MD  Expected Discharge Date:                 Expected Discharge Plan:  [place]  In-House Referral:  NA  Discharge planning Services  CM Consult  Post Acute Care Choice:  Home Health Choice offered to:  Patient  DME Arranged:  N/A DME Agency:  NA  HH Arranged:  RN, Disease Management HH Agency:     Status of Service:  In process, will continue to follow  If discussed at Long Length of Stay Meetings, dates discussed:    Additional Comments:  Erenest Rasher, RN 04/05/2016, 10:30 AM

## 2016-04-05 NOTE — Consult Note (Signed)
CARDIOLOGY CONSULT NOTE  Patient ID: JASMEET GOUCH MRN: [id number] DOB/AGE: July 16, 1975 40 y.o.  Admit date: 04/03/2016 Primary Cardiologist: Harl Bowie Referring: [place] Poli Reason for Consultation: CHF  HPI: 40 yo with history of HTN, DM2, OSA, and nonischemic cardiomyopathy was admitted with acute on chronic systolic CHF in setting of hypertensive emergency/pulmonary edema.  She first developed CHF after birth of child in 2009.  EF 50-55% at that time.  She developed CHF again after birth of 2nd child in 2015, EF 35% at that time.  She has also had long-standing HTN. Cardiac cath in 8/17 did not show significant CAD.  Last echo in 8/17 showed EF 40-45%.  Of note, her mother and grandmother had CHF/cardiomyopathy of uncertain etiology.    For about a week prior to admission, she had increased exertional dyspnea.  She says that she was taking all her home meds.  A couple days prior to admission, she developed nausea and vomiting.  She was not able to keep her meds down.  She was admitted on 11/18 with BP as high as 200/120 with pulmonary edema.  She is now getting her meds and BP is controlled.  She is getting IV Lasix, starting to feel better.  She is in NSR.    Review of systems complete and found to be negative unless listed above in HPI  Past Medical History: 1. OSA 2. HTN 3. Type II diabetes 4. Chronic systolic CHF: Nonischemic cardiomyopathy.  ?Hypertensive cardiomyopathy versus familial CMP (mother and grandmother with CHF/cardiomyopathy of uncertain etiology) versus peripartum CMP.  - Initially had CHF after birth of child in 2009 => EF 50-55% at that time.  Had another pregnancy in 2015, at that time EF was found to be 35%. Now has had tubal ligation.  - 11/15 echo: EF 35%. - 8/17 echo: EF 40-45% - LHC (8/17): No significant CAD.  5. Depression with panic attacks.   Family History  Problem Relation Age of Onset  . Diabetes Mother   . Heart disease Mother   . Hyperlipidemia  Paternal Grandfather   . Hypertension Paternal Grandfather   . Heart disease Father   . Heart disease Maternal Grandmother   . ADD / ADHD Son   . Hypertension Maternal Uncle   . Heart attack Brother   . Sudden death Neg Hx     Social History   Social History  . Marital status: Single    Spouse name: N/A  . Number of children: N/A  . Years of education: N/A   Occupational History  . unemployed Unemployed   Social History Main Topics  . Smoking status: Never Smoker  . Smokeless tobacco: Never Used  . Alcohol use Yes     Comment: occ  . Drug use: No  . Sexual activity: Not Currently    Birth control/ protection: Surgical     Comment: tubal   Other Topics Concern  . Not on file   Social History Narrative   Lives in [place]   Engaged/boyfriend (father to youngest chid)   4 children: daughter (45 as of 2013), sons (15, 64, 77 as of 2013)   Religion: christian     Prescriptions Prior to Admission  Medication Sig Dispense Refill Last Dose  . acetaminophen (TYLENOL) 500 MG tablet Take 1,000 mg by mouth every 6 (six) hours as needed.   04/03/2016 at Unknown time  . amLODipine (NORVASC) 5 MG tablet Take 2 tablets (10 mg total) by mouth daily. 60 tablet 3  04/02/2016 at Unknown time  . aspirin EC 81 MG tablet Take 81 mg by mouth daily.   04/02/2016 at Unknown time  . carvedilol (COREG) 12.5 MG tablet TAKE 3 TABLETS BY MOUTH TWICE DAILY WITH A MEAL. 90 tablet 6 04/02/2016 at 1330  . escitalopram (LEXAPRO) 20 MG tablet Take 1 tablet (20 mg total) by mouth daily. 30 tablet 6 04/03/2016 at Unknown time  . furosemide (LASIX) 40 MG tablet Take 1 tablet (40 mg total) by mouth daily. 30 tablet 2 04/02/2016 at Unknown time  . isosorbide-hydrALAZINE (BIDIL) 20-37.5 MG tablet Take 1 tablet by mouth 3 (three) times daily.   04/02/2016 at Unknown time  . linagliptin (TRADJENTA) 5 MG TABS tablet Take 5 mg by mouth daily.   04/03/2016 at Unknown time  . metFORMIN (GLUCOPHAGE) 500 MG tablet Take 1  tablet (500 mg total) by mouth 2 (two) times daily with a meal.   04/02/2016 at Unknown time  . nitroGLYCERIN (NITROSTAT) 0.4 MG SL tablet Place 1 tablet (0.4 mg total) under the tongue every 5 (five) minutes as needed for chest pain. 30 tablet 12 PRN  . ondansetron (ZOFRAN-ODT) 8 MG disintegrating tablet Take 1 tablet (8 mg total) by mouth every 8 (eight) hours as needed for nausea or vomiting. 10 tablet 0 04/03/2016 at Unknown time  . potassium chloride SA (K-DUR,KLOR-CON) 20 MEQ tablet Take 2 tablets (40 mEq total) by mouth daily. 30 tablet 6 04/02/2016 at Unknown time  . pregabalin (LYRICA) 75 MG capsule Take 75 mg by mouth daily.   04/02/2016 at Unknown time  . sacubitril-valsartan (ENTRESTO) 49-51 MG Take 1 tablet by mouth 2 (two) times daily. 60 tablet 1 04/03/2016 at Unknown time  . sitaGLIPtin (JANUVIA) 100 MG tablet Take 100 mg by mouth daily.   04/02/2016 at Unknown time  . spironolactone (ALDACTONE) 25 MG tablet Take 1 tablet (25 mg total) by mouth daily. 30 tablet 3 04/03/2016 at Unknown time  . promethazine (PHENERGAN) 12.5 MG tablet Take 1 tablet (12.5 mg total) by mouth every 6 (six) hours as needed for nausea or vomiting. (Patient not taking: Reported on 04/03/2016) 12 tablet 0 Not Taking at Unknown time   Current Scheduled Meds: . aspirin EC  81 mg Oral Daily  . carvedilol  12.5 mg Oral BID WC  . Chlorhexidine Gluconate Cloth  6 each Topical Q0600  . diazepam  2 mg Oral Once  . escitalopram  20 mg Oral Daily  . furosemide  40 mg Intravenous TID  . heparin  5,000 Units Subcutaneous Q8H  . Influenza vac split quadrivalent PF  0.5 mL Intramuscular Tomorrow-1000  . insulin aspart  0-15 Units Subcutaneous TID WC  . [START ON 04/06/2016] insulin glargine  15 Units Subcutaneous Daily  . insulin glargine  5 Units Subcutaneous Once  . isosorbide-hydrALAZINE  1 tablet Oral TID  . mupirocin ointment  1 application Nasal BID  . potassium chloride  40 mEq Oral BID  . pregabalin  75 mg  Oral QHS  . sacubitril-valsartan  1 tablet Oral BID  . sodium chloride flush  3 mL Intravenous Q12H  . spironolactone  25 mg Oral Daily   Continuous Infusions: PRN Meds:.acetaminophen **OR** acetaminophen, diphenhydrAMINE, hydrALAZINE, nitroGLYCERIN, ondansetron **OR** ondansetron (ZOFRAN) IV, oxyCODONE-acetaminophen   Physical exam Blood pressure (!) 128/95, pulse 81, temperature 97.5 F (36.4 C), temperature source Oral, resp. rate (!) 25, height 5\' 6"  (1.676 m), weight 240 lb (108.9 kg), last menstrual period 04/03/2016, SpO2 99 %, not currently breastfeeding.  General: NAD Neck: Thick, JVP 10 cm, no thyromegaly or thyroid nodule.  Lungs: Clear to auscultation bilaterally with normal respiratory effort. CV: Nondisplaced PMI.  Heart regular S1/S2, no S3/S4, no murmur.  1+ ankle edema.   Abdomen: Soft, nontender, no hepatosplenomegaly, no distention.  Skin: Intact without lesions or rashes.  Neurologic: Alert and oriented x 3.  Psych: Normal affect. Extremities: No clubbing or cyanosis.  HEENT: Normal.   Labs:   Lab Results  Component Value Date   WBC 10.0 04/05/2016   HGB 10.3 (L) 04/05/2016   HCT 34.7 (L) 04/05/2016   MCV 70.2 (L) 04/05/2016   PLT 351 04/05/2016    Recent Labs Lab 04/03/16 0823  04/05/16 0208  NA 135  < > 136  K 3.8  < > 3.7  CL 103  < > 96*  CO2 25  < > 30  BUN 18  < > 18  CREATININE 0.95  < > 1.06*  CALCIUM 8.8*  < > 8.4*  PROT 6.6  --   --   BILITOT 0.4  --   --   ALKPHOS 78  --   --   ALT 57*  --   --   AST 56*  --   --   GLUCOSE 300*  < > 153*  < > = values in this interval not displayed. Lab Results  Component Value Date   TROPONINI <0.03 04/03/2016    Radiology: - CT chest: pulmonary edema  EKG: NSR, right axis deviation, anterolateral T wave inversions  ASSESSMENT AND PLAN: 40 yo with history of HTN, DM2, OSA, and nonischemic cardiomyopathy was admitted with acute on chronic systolic CHF in setting of hypertensive  emergency/pulmonary edema. 1.  HTN: Admitted with hypertensive emergency and pulmonary edema.  BP now much better taking all her meds (no longer vomiting).   2. Acute on chronic CHF: Last echo with EF 40-45% (8/17).  Cause of cardiomyopathy is uncertain => nonischemic, could be hypertensive CMP versus familial CMP (mother, GM with CMP of uncertain etiology) versus post-partum (initial episode after 2009 delivery, then again 2015).  She now has had a tubal ligation.  She was admitted with hypertensive emergency/volume overload. She remains volume overloaded on exam.    - Increase Lasix to 40 mg IV every 8 hrs.  - Will arrange for cardiac MRI to look for infiltrative disease, reassess EF.  - Continue current Bidil, Entresto, spironolactone, and Coreg.  - Increase KCl to bid.   Loralie Champagne 04/05/2016   Signed: @ME1 @ 04/05/2016, 9:00 AM Co-Sign MD

## 2016-04-05 NOTE — Progress Notes (Addendum)
Heart Failure Navigator Consult Note  Presentation: per H & P Chelsey Santiago is a 40 y.o. female with medical history significant of combined CHF, HTN, DM 2 and OSA came to the hospital complaining about SOB. Patient reported DOE, orthopnea, PND and lower extremity edema started on Monday, she was evaluated in the ED had been on diuretics was switched to Lasix, patient also has some nausea and vomiting so was not able to keep medications down. She came back today to [facility] ED and was admitted to the hospital, she asked staff to transfer her to [facility] as she does not want to be admitted to [facility], her mother died over there. Patient left AMA when she was told she will not be admitted to [facility].  Past Medical History:  Diagnosis Date  . Anemia    H&H of 10.6/33 and 07/2008 and 11.9/35 and 09/2010  . Anxiety   . CHF (congestive heart failure) ([place])   . Depression with anxiety   . Diabetes mellitus without complication ([place])   . Enlarged heart   . Fasting hyperglycemia   . Hypertension    Lab: Normal BMet except glucose of 118 in 09/2010  . Hypertensive heart disease 2009   Pulmonary edema postpartum; mild to moderate mitral regurgitation when hospitalized for CHF in 2009; Echocardiogram in 12/2009-no MR and normal EF; normal CXR in 09/2010  . Migraine headache   . Miscarriage 03/19/2013  . Obesity 04/16/2009  . Osteoarthritis, knee 03/29/2011  . Preeclampsia   . Pregnant   . Pulmonary edema   . Sleep apnea   . Threatened abortion in early pregnancy 03/15/2013    Social History   Social History  . Marital status: Single    Spouse name: N/A  . Number of children: N/A  . Years of education: N/A   Occupational History  . unemployed Unemployed   Social History Main Topics  . Smoking status: Never Smoker  . Smokeless tobacco: Never Used  . Alcohol use Yes     Comment: occ  . Drug use: No  . Sexual activity: Not Currently    Birth control/ protection:  Surgical     Comment: tubal   Other Topics Concern  . None   Social History Narrative   Lives in [place]   Engaged/boyfriend (father to youngest chid)   4 children: daughter (39 as of 2013), sons (15, 45, 69 as of 2013)   Religion: christian    ECHO: Study Conclusions--12/20/15  - Left ventricle: The cavity size was normal. Wall thickness was   increased in a pattern of mild LVH. There was mild focal basal   hypertrophy of the septum. Systolic function was mildly to   moderately reduced. The estimated ejection fraction was in the   range of 40% to 45%. Diffuse hypokinesis. Features are consistent   with a pseudonormal left ventricular filling pattern, with   concomitant abnormal relaxation and increased filling pressure   (grade 2 diastolic dysfunction).  Impressions:  - Mild to moderate global reduction in LV function; mild LVH with   proximal septal thickening; grade 2 diastolic dysfunction. BNP    Component Value Date/Time   BNP 2,138.8 (H) 04/03/2016 1512    ProBNP    Component Value Date/Time   PROBNP 2,380.0 (H) 04/22/2014 1624     Education Assessment and Provision:  Detailed education and instructions provided on heart failure disease management including the following:  Signs and symptoms of Heart Failure When to  call the physician Importance of daily weights Low sodium diet Fluid restriction Medication management Anticipated future follow-up appointments  Patient education given on each of the above topics.  Patient acknowledges understanding and acceptance of all instructions.  I spoke with Chelsey Santiago regarding her HF and current hospitalization.  She tells me that she has been told in the past that she has HF and received HF education at that time.  She does not have a scale and has not been weighing at home.  I have provided her a scale for use at home.  I reinforced the need for daily weights and how weight increases relate to the signs and symptoms  of HF.  We discussed a low sodium diet and high sodium foods to avoid.  She seems quite insistent that she follows a low sodium diet at home.  She denies any issues with getting and taking prescribed medications.  She will likely follow with [facility] Clinic after discharge.    Education Materials:  "Living Better With Heart Failure" Booklet, Daily Weight Tracker Tool    High Risk Criteria for Readmission and/or Poor Patient Outcomes:  (Recommend Follow-up with Advanced Heart Failure Clinic)--yes   EF <30%-40-45% with grade 2 dias dys  2 or more admissions in 6 months-yes 3/65mo  Difficult social situation- denies  Demonstrates medication noncompliance- denies   Barriers of Care:  Knowledge and compliance  Discharge Planning:   Plans to return to home in [place] with 6 children --2 adult and 4 under age of 70.  She does have a grandmother that can provide assistance as needed.

## 2016-04-06 DIAGNOSIS — J810 Acute pulmonary edema: Secondary | ICD-10-CM

## 2016-04-06 DIAGNOSIS — I428 Other cardiomyopathies: Secondary | ICD-10-CM

## 2016-04-06 DIAGNOSIS — I429 Cardiomyopathy, unspecified: Secondary | ICD-10-CM

## 2016-04-06 LAB — CBC
HEMATOCRIT: 35 % — AB (ref 36.0–46.0)
HEMOGLOBIN: 10.4 g/dL — AB (ref 12.0–15.0)
MCH: 21 pg — AB (ref 26.0–34.0)
MCHC: 29.7 g/dL — AB (ref 30.0–36.0)
MCV: 70.7 fL — ABNORMAL LOW (ref 78.0–100.0)
Platelets: 286 10*3/uL (ref 150–400)
RBC: 4.95 MIL/uL (ref 3.87–5.11)
RDW: 17.5 % — AB (ref 11.5–15.5)
WBC: 9 10*3/uL (ref 4.0–10.5)

## 2016-04-06 LAB — TROPONIN I: Troponin I: <0.03 ng/mL (ref <0.03)

## 2016-04-06 LAB — BASIC METABOLIC PANEL
ANION GAP: 8 (ref 5–15)
BUN: 18 mg/dL (ref 6–20)
CALCIUM: 8.8 mg/dL — AB (ref 8.9–10.3)
CO2: 30 mmol/L (ref 22–32)
Chloride: 99 mmol/L — ABNORMAL LOW (ref 101–111)
Creatinine, Ser: 0.96 mg/dL (ref 0.44–1.00)
GFR calc Af Amer: >60 mL/min (ref >60)
GFR calc non Af Amer: >60 mL/min (ref >60)
GLUCOSE: 146 mg/dL — AB (ref 65–99)
POTASSIUM: 4.6 mmol/L (ref 3.5–5.1)
Sodium: 137 mmol/L (ref 135–145)

## 2016-04-06 LAB — GLUCOSE, CAPILLARY
GLUCOSE-CAPILLARY: 216 mg/dL — AB (ref 65–99)
Glucose-Capillary: 197 mg/dL — ABNORMAL HIGH (ref 65–99)
Glucose-Capillary: 348 mg/dL — ABNORMAL HIGH (ref 65–99)
Glucose-Capillary: 140 mg/dL — ABNORMAL HIGH (ref 65–99)

## 2016-04-06 MED ORDER — LIVING WELL WITH DIABETES BOOK
Freq: Once | Status: AC
  Administered 2016-04-06: 20:00:00
  Filled 2016-04-06 (×2): qty 1

## 2016-04-06 MED ORDER — FUROSEMIDE 10 MG/ML IJ SOLN
80.0000 mg | Freq: Two times a day (BID) | INTRAMUSCULAR | Status: DC
  Administered 2016-04-06 – 2016-04-08 (×5): 80 mg via INTRAVENOUS
  Filled 2016-04-06 (×5): qty 8

## 2016-04-06 MED ORDER — METOLAZONE 5 MG PO TABS
2.5000 mg | ORAL_TABLET | Freq: Once | ORAL | Status: AC
  Administered 2016-04-06: 2.5 mg via ORAL
  Filled 2016-04-06: qty 1

## 2016-04-06 NOTE — Progress Notes (Signed)
Patient stated she can place self on and off CPAP tonight, RT made pt aware that if she needed help to call.

## 2016-04-06 NOTE — Care Management (Addendum)
Case Management Note Initial Note Started by Jonnie Finner 04-05-16 Patient Details  Name: DEMESHIA YOKE MRN: [id number] Date of Birth: May 06, 1976  Subjective/Objective:       CHF, HTN, DM 2             Action/Plan: Discharge Planning:  NCM spoke to pt and states she lives at home with small children. Has an adult Parke Simmers # [phone] that can assist at home and drive her to her appts as needed. Provided pt with Living Well with HF booklet. States she has a scale at home. And cooks majority of her meals at home monitoring her sodium. Referral to CHF RN and diabetes teaching. Offered choice for HH/list provided. Pt may benefit from CHF Disease Management RN.    PCP Rosita Fire MD  Expected Discharge Date:                         Expected Discharge Plan:  [place]  In-House Referral:  NA  Discharge planning Services  CM Consult  Post Acute Care Choice:  N/A Choice offered to N/A  DME Arranged:  N/A DME Agency:  NA  HH Arranged:  RN, Disease Management HH Agency: N/A    Status of Service: Complete  If discussed at Long Length of Stay Meetings, dates discussed:    Additional Comments: 1201 04-07-16 Jacqlyn Krauss, RN, BSN [phone] CM did speak with pt in regards to disposition needs. Pt refusing HHRN at this time. Daphne Heart Failure Navigator did provide pt with scale. No further needs at this time.    1223 04-06-16 Jacqlyn Krauss, RN,BSN [phone] Pt transferred from 4E. Pt states she needs assistance with personal care services. Pt has Medicaid. Pt is aware to contact her CSW at [employer] in regards to if she qualifies for hours to get started. Pt will benefit from [facility] at d/c for medication and disease management. CM will touch base with her later to see if agency chose from list. Pt states she is from home with  Her four children. CM will monitor.

## 2016-04-06 NOTE — Progress Notes (Signed)
CARDIAC REHAB PHASE I   PRE:  Rate/Rhythm: 81 SR  BP:  Sitting: 124/83        SaO2: 95 RA  MODE:  Ambulation: 350 ft   POST:  Rate/Rhythm: 99 SR  BP:  Sitting: 122/80         SaO2: 98 RA  Pt ambulated 350 ft on RA, hand held assist, mostly steady gait, tolerated well. Pt c/o mild DOE, headache, feeling "a little wobbly," declined rest stop. Completed CHF education. Reviewed exercise guidelines, heart healthy diet, carb counting, sodium restrictions, CHF booklet and zone tool, daily weights, stress management and phase 2 cardiac rehab. Pt verbalized understanding, receptive to education. Pt interested in phase 2 cardiac rehab. Will send referral to [facility]. Pt to bed per pt request after walk, call bell within reach. Will follow.    [place], RN, BSN 04/06/2016 3:33 PM

## 2016-04-06 NOTE — Progress Notes (Signed)
Inpatient Diabetes Program Recommendations  AACE/ADA: New Consensus Statement on Inpatient Glycemic Control (2015)  Target Ranges:  Prepandial:   less than 140 mg/dL      Peak postprandial:   less than 180 mg/dL (1-2 hours)      Critically ill patients:  140 - 180 mg/dL   Results for Chelsey Santiago, Chelsey Santiago (MRN [id number]) as of 04/06/2016 14:28  Ref. Range 04/05/2016 12:00 04/05/2016 16:20 04/05/2016 21:58 04/06/2016 07:33 04/06/2016 11:25  Glucose-Capillary Latest Ref Range: 65 - 99 mg/dL 215 (H) 211 (H) 235 (H) 197 (H) 348 (H)   Results for Chelsey Santiago, Chelsey Santiago (MRN [id number]) as of 04/06/2016 14:28  Ref. Range 04/03/2016 20:32  Hemoglobin A1C Latest Ref Range: 4.8 - 5.6 % 10.3 (H)   Review of Glycemic Control  Diabetes history:       DM 2  Outpatient Diabetes medications:       Metformin 500 BID, Januvia 100 mg Daily  Current orders for Inpatient glycemic control:       Lantus 15 units (increased today at 1000), Novolog Moderate TID  Inpatient Diabetes Program Recommendations, please consider  :      Increasing basal insulin to Lantus 18 units;      Novolog 5 units meal coverage TIDAC;       Note to MD:       Please indicate if patient will be discharged on insulin so that nursing can start to teach patient how to give self insulin.      Patient would like outpatient consult with Dr. Dorris Fetch (Endocrinology) for long-term management of DM.      Outpatient classes, Living Well with Diabetes, and RD consult ordered for patient.  Thank you,  Windy Carina, RN, BSN Diabetes Coordinator Inpatient Diabetes Program [phone] (Team Pager)

## 2016-04-06 NOTE — Progress Notes (Signed)
EKG showed new TWI in lead I and aVL. Will get stat troponin. Continue to monitor.

## 2016-04-06 NOTE — Progress Notes (Signed)
Pt c/o 5/10 chest pressure and head ache while resting in bed and talking on the phone. Pt stated it was a gradual pressure. V/S stable. EKG obtained. PRN percocet given. Cardiology PA on call paged. Will cont to monitor pt.

## 2016-04-06 NOTE — Progress Notes (Signed)
Placed pt on cpap 8 CMH20 tolerating well

## 2016-04-06 NOTE — Progress Notes (Signed)
PROGRESS NOTE  Chelsey Santiago  [id number] DOB: 26-Feb-1976 DOA: 04/03/2016 PCP: Rosita Fire, MD Outpatient Specialists:  Subjective: I appreciate cardiology's help, try to diurese her more today.  No orthopnea but he still has +1 to +2 bilateral pedal edema. Will ambulate in the hall.  Brief Narrative:  Chelsey Santiago is a 40 y.o. female with medical history significant of combined CHF, HTN, DM 2 and OSA came to the hospital complaining about SOB. Patient reported DOE, orthopnea, PND and lower extremity edema started on Monday, she was evaluated in the ED had been on diuretics was switched to Lasix, patient also has some nausea and vomiting so was not able to keep medications down. She came back today to [facility] ED and was admitted to the hospital, she asked staff to transfer her to [facility] as she does not want to be admitted to [facility], her mother died over there. Patient left AMA when she was told she will not be admitted to [facility].  Assessment & Plan:   Principal Problem:   Acute on chronic combined systolic and diastolic ACC/AHA stage C congestive heart failure (HCC) Active Problems:   Pulmonary edema   DM type 2 (diabetes mellitus, type 2) ([place])   Cardiomyopathy due to hypertension [facility])   Hypertensive emergency    Acute on chronic combined systolic and diastolic CHF -Patient presented DOE, orthopnea, PND and lower severe edema, BNP is 2100. -CT was done earlier at [facility] showed groundglass appearance consistent with pulmonary edema. -Heart failure medications restarted including BiDil, Entresto, Coreg and Aldactone -Diurese with IV Lasix 40 mg started twice a day. Good response, lost 10 pounds since admission -Elevated legs, restrict fluid and salt intake, follow daily weights and renal function. -Please discontinue amlodipine on discharge. -Await heart failure team recommendation for discharge likely 11/22 versus  11/23.  Hypertensive emergency  -Blood pressure 190/120 upon presentation to the ED, started on nitroglycerin drip. -This is likely secondary to acute CHF plus patient was not able to take her medication for the past several days. -Was on nitroglycerin drip, had tremendous headache with that, this was discontinued on 11/19 AM. -Blood pressure is controlled, continue CHF medications.  Pulmonary edema -Secondary to acute CHF, diurese patient, follow intake/output.  Nausea and vomiting -Patient blood pressure was 126/86 when she presented to the ED on Monday. -She did have nausea and vomiting back then, so nausea vomiting likely not secondary to elevated blood pressure. -This is resolved, Likely transient gastroenteritis.  DM 2 -Uncontrolled DM II, hemoglobin A1c on 03/11/2016 was 9.8 (this is correlate with mean plasma glucose of 235). -Hold oral medications, start on carbohydrate modified diet and SSI.  OSA -Patient advised about weight loss, CPAP daily at bedtime.  Anemia -Chronic microcytic anemia, ferritin is 11 indicating iron deficiency anemia, started oral supplements.  Hypokalemia -This is secondary to diuresis, replete with oral supplements.   DVT prophylaxis: Subcutaneous heparin Code Status: Full Code Family Communication:  Disposition Plan: Transfer to telemetry. Diet: Diet heart healthy/carb modified Room service appropriate? Yes; Fluid consistency: Thin  Consultants:   None  Procedures:   None  Antimicrobials:   None  Objective: Vitals:   04/05/16 1642 04/05/16 2201 04/06/16 0540 04/06/16 0912  BP: (!) 125/101 108/64 (!) 148/113 (!) 125/107  Pulse: 85 85 81   Resp:   20   Temp:  97.6 F (36.4 C) 97.7 F (36.5 C)   TempSrc:  Oral Oral   SpO2:  93%  99%   Weight:   109.1 kg (240 lb 9.6 oz)   Height:        Intake/Output Summary (Last 24 hours) at 04/06/16 1108 Last data filed at 04/06/16 1000  Gross per 24 hour  Intake             1074  ml  Output                0 ml  Net             1074 ml   Filed Weights   04/04/16 0400 04/05/16 0424 04/06/16 0540  Weight: 110.5 kg (243 lb 11.2 oz) 108.9 kg (240 lb) 109.1 kg (240 lb 9.6 oz)    Examination: General exam: Appears calm and comfortable  Respiratory system: Clear to auscultation. Respiratory effort normal. Cardiovascular system: S1 & S2 heard, RRR. No JVD, murmurs, rubs, gallops or clicks. No pedal edema. Gastrointestinal system: Abdomen is nondistended, soft and nontender. No organomegaly or masses felt. Normal bowel sounds heard. Central nervous system: Alert and oriented. No focal neurological deficits. Extremities: Symmetric 5 x 5 power. Skin: No rashes, lesions or ulcers Psychiatry: Judgement and insight appear normal. Mood & affect appropriate.   Data Reviewed: I have personally reviewed following labs and imaging studies  CBC:  Recent Labs Lab 04/03/16 0823 04/03/16 1512 04/04/16 0337 04/05/16 0208 04/06/16 0435  WBC 10.2 9.4 9.5 10.0 9.0  NEUTROABS 7.2  --   --   --   --   HGB 9.6* 10.0* 9.6* 10.3* 10.4*  HCT 32.2* 33.2* 32.0* 34.7* 35.0*  MCV 70.8* 69.7* 69.6* 70.2* 70.7*  PLT 285 278 294 351 [id number]   Basic Metabolic Panel:  Recent Labs Lab 04/03/16 0823 04/03/16 1512 04/04/16 0337 04/05/16 0208 04/05/16 0735 04/06/16 0435  NA 135 137 138 136  --  137  K 3.8 3.7 3.4* 3.7  --  4.6  CL 103 100* 101 96*  --  99*  CO2 25 28 26 30   --  30  GLUCOSE 300* 296* 189* 153*  --  146*  BUN 18 14 12 18   --  18  CREATININE 0.95 1.11* 0.89 1.06*  --  0.96  CALCIUM 8.8* 8.9 8.2* 8.4*  --  8.8*  MG  --   --   --   --  1.8  --    GFR: Estimated Creatinine Clearance: 97.4 mL/min (by C-G formula based on SCr of 0.96 mg/dL). Liver Function Tests:  Recent Labs Lab 04/03/16 0823  AST 56*  ALT 57*  ALKPHOS 78  BILITOT 0.4  PROT 6.6  ALBUMIN 2.9*   No results for input(s): LIPASE, AMYLASE in the last 168 hours. No results for input(s): AMMONIA in  the last 168 hours. Coagulation Profile:  Recent Labs Lab 04/03/16 2032  INR 1.09   Cardiac Enzymes:  Recent Labs Lab 04/03/16 0823  TROPONINI <0.03   BNP (last 3 results) No results for input(s): PROBNP in the last 8760 hours. HbA1C:  Recent Labs  04/03/16 2032  HGBA1C 10.3*   CBG:  Recent Labs Lab 04/05/16 0818 04/05/16 1200 04/05/16 1620 04/05/16 2158 04/06/16 0733  GLUCAP 255* 215* 211* 235* 197*   Lipid Profile: No results for input(s): CHOL, HDL, LDLCALC, TRIG, CHOLHDL, LDLDIRECT in the last 72 hours. Thyroid Function Tests:  Recent Labs  04/03/16 2032  TSH 0.835   Anemia Panel: No results for input(s): VITAMINB12, FOLATE, FERRITIN, TIBC, IRON, RETICCTPCT in the last 72 hours. Urine analysis:  Component Value Date/Time   COLORURINE YELLOW 12/08/2015 0045   APPEARANCEUR CLOUDY (A) 12/08/2015 0045   LABSPEC 1.029 12/08/2015 0045   PHURINE 5.5 12/08/2015 0045   GLUCOSEU 100 (A) 12/08/2015 0045   HGBUR NEGATIVE 12/08/2015 0045   BILIRUBINUR NEGATIVE 12/08/2015 0045   KETONESUR NEGATIVE 12/08/2015 0045   PROTEINUR 100 (A) 12/08/2015 0045   UROBILINOGEN 0.2 04/29/2014 1115   NITRITE NEGATIVE 12/08/2015 0045   LEUKOCYTESUR NEGATIVE 12/08/2015 0045   Sepsis Labs: @LABRCNTIP (procalcitonin:4,lacticidven:4)  ) Recent Results (from the past 240 hour(s))  MRSA PCR Screening     Status: Abnormal   Collection Time: 04/03/16  8:22 PM  Result Value Ref Range Status   MRSA by PCR POSITIVE (A) NEGATIVE Final    Comment:        The GeneXpert MRSA Assay (FDA approved for NASAL specimens only), is one component of a comprehensive MRSA colonization surveillance program. It is not intended to diagnose MRSA infection nor to guide or monitor treatment for MRSA infections. RESULT CALLED TO, READ BACK BY AND VERIFIED WITH:  TO BAVERY(RN) BY TCLEVELAND 04/04/16 AT 1:14AM      Invalid input(s): PROCALCITONIN, LACTICACIDVEN   Radiology Studies: Mr  Cardiac Morphology W Wo Contrast  Result Date: 04/06/2016 CLINICAL DATA:  Cardiomyopathy of uncertain etiology EXAM: CARDIAC MRI TECHNIQUE: The patient was scanned on a 1.5 Tesla GE magnet. A dedicated cardiac coil was used. Functional imaging was done using Fiesta sequences. 2,3, and 4 chamber views were done to assess for RWMA's. Modified Simpson's rule using a short axis stack was used to calculate an ejection fraction on a dedicated work Conservation officer, nature. The patient received 35 cc of Multihance. After 10 minutes inversion recovery sequences were used to assess for infiltration and scar tissue. CONTRAST:  35 cc Multihance FINDINGS: The patient was claustrophobic and was given Valium for sedation. Difficult images due to poor breath-holding. Limited images of the lung fields showed no gross abnormalities. Normal left ventricular size with moderate LV hypertrophy. EF 35% with diffuse hypokinesis. Mildly dilated right ventricle with mildly decreased systolic function. Mild biatrial enlargement. There was trivial mitral regurgitation, mild to moderate tricuspid regurgitation. Trileaflet aortic valve with no significant stenosis or regurgitation. On delayed enhancement imaging, there was no myocardial late gadolinium enhancement (LGE). MEASUREMENTS: MEASUREMENTS LVEDV 197 mL LV SV 69 mL LV EF 35% IMPRESSION: 1.  Technically difficult study. 2.  Normal LV size with moderate LV hypertrophy, EF 35%. 3.  Mildly dilated RV with mildly decreased RV systolic function. 4. No myocardial LGE, so no definitive evidence for prior myocardial infarction, infiltrative disease, or myocarditis. Dalton Mclean Electronically Signed   By: Loralie Champagne M.D.   On: 04/06/2016 07:38        Scheduled Meds: . aspirin EC  81 mg Oral Daily  . carvedilol  12.5 mg Oral BID WC  . Chlorhexidine Gluconate Cloth  6 each Topical Q0600  . escitalopram  20 mg Oral Daily  . furosemide  80 mg Intravenous BID  . heparin   5,000 Units Subcutaneous Q8H  . Influenza vac split quadrivalent PF  0.5 mL Intramuscular Tomorrow-1000  . insulin aspart  0-15 Units Subcutaneous TID WC  . insulin glargine  15 Units Subcutaneous Daily  . isosorbide-hydrALAZINE  1 tablet Oral TID  . mupirocin ointment  1 application Nasal BID  . potassium chloride  40 mEq Oral BID  . pregabalin  75 mg Oral QHS  . sacubitril-valsartan  1 tablet Oral BID  .  sodium chloride flush  3 mL Intravenous Q12H  . spironolactone  25 mg Oral Daily   Continuous Infusions:   LOS: 3 days    Time spent: 35 minutes    Adorian Gwynne A, MD [facility] Pager [phone]  If 7PM-7AM, please contact night-coverage [email or web address] Password [email or web address] 04/06/2016, 11:08 AM

## 2016-04-06 NOTE — Progress Notes (Signed)
Patient ID: Chelsey Santiago, female   DOB: 05/14/1976, 40 y.o.   MRN: [id number]   SUBJECTIVE: She did not diurese well yesterday.  Overall, breathing is better but still feels like she has excess fluid.  BP reasonably controlled for the most part.   Cardiac MRI (11/20): Moderate LVH, diffuse hypokinesis EF 35%, Mildly dilated RV with mildly decreased systolic function, no LGE.   Scheduled Meds: . aspirin EC  81 mg Oral Daily  . carvedilol  12.5 mg Oral BID WC  . Chlorhexidine Gluconate Cloth  6 each Topical Q0600  . escitalopram  20 mg Oral Daily  . furosemide  80 mg Intravenous BID  . heparin  5,000 Units Subcutaneous Q8H  . Influenza vac split quadrivalent PF  0.5 mL Intramuscular Tomorrow-1000  . insulin aspart  0-15 Units Subcutaneous TID WC  . insulin glargine  15 Units Subcutaneous Daily  . isosorbide-hydrALAZINE  1 tablet Oral TID  . metolazone  2.5 mg Oral Once  . mupirocin ointment  1 application Nasal BID  . potassium chloride  40 mEq Oral BID  . pregabalin  75 mg Oral QHS  . sacubitril-valsartan  1 tablet Oral BID  . sodium chloride flush  3 mL Intravenous Q12H  . spironolactone  25 mg Oral Daily   Continuous Infusions: PRN Meds:.acetaminophen **OR** acetaminophen, diphenhydrAMINE, hydrALAZINE, nitroGLYCERIN, ondansetron **OR** ondansetron (ZOFRAN) IV, oxyCODONE-acetaminophen    Vitals:   04/05/16 1515 04/05/16 1642 04/05/16 2201 04/06/16 0540  BP: 103/70 (!) 125/101 108/64 (!) 148/113  Pulse: 80 85 85 81  Resp: (!) 21   20  Temp: 97.9 F (36.6 C)  97.6 F (36.4 C) 97.7 F (36.5 C)  TempSrc: Oral  Oral Oral  SpO2: 100%  93% 99%  Weight:    240 lb 9.6 oz (109.1 kg)  Height:        Intake/Output Summary (Last 24 hours) at 04/06/16 0807 Last data filed at 04/05/16 1820  Gross per 24 hour  Intake             1074 ml  Output             1000 ml  Net               74 ml    LABS: Basic Metabolic Panel:  Recent Labs  04/05/16 0208 04/05/16 0735  04/06/16 0435  NA 136  --  137  K 3.7  --  4.6  CL 96*  --  99*  CO2 30  --  30  GLUCOSE 153*  --  146*  BUN 18  --  18  CREATININE 1.06*  --  0.96  CALCIUM 8.4*  --  8.8*  MG  --  1.8  --    Liver Function Tests:  Recent Labs  04/03/16 0823  AST 56*  ALT 57*  ALKPHOS 78  BILITOT 0.4  PROT 6.6  ALBUMIN 2.9*   No results for input(s): LIPASE, AMYLASE in the last 72 hours. CBC:  Recent Labs  04/03/16 0823  04/05/16 0208 04/06/16 0435  WBC 10.2  < > 10.0 9.0  NEUTROABS 7.2  --   --   --   HGB 9.6*  < > 10.3* 10.4*  HCT 32.2*  < > 34.7* 35.0*  MCV 70.8*  < > 70.2* 70.7*  PLT 285  < > 351 286  < > = values in this interval not displayed. Cardiac Enzymes:  Recent Labs  04/03/16 0823  TROPONINI <0.03  BNP: Invalid input(s): POCBNP D-Dimer: No results for input(s): DDIMER in the last 72 hours. Hemoglobin A1C:  Recent Labs  04/03/16 2032  HGBA1C 10.3*   Fasting Lipid Panel: No results for input(s): CHOL, HDL, LDLCALC, TRIG, CHOLHDL, LDLDIRECT in the last 72 hours. Thyroid Function Tests:  Recent Labs  04/03/16 2032  TSH 0.835   Anemia Panel: No results for input(s): VITAMINB12, FOLATE, FERRITIN, TIBC, IRON, RETICCTPCT in the last 72 hours.  RADIOLOGY: Dg Chest 2 View  Result Date: 04/03/2016 CLINICAL DATA:  Short of breath. CHF. Bilateral foot and ankle swelling. EXAM: CHEST  2 VIEW COMPARISON:  03/29/2016 FINDINGS: The heart is moderately enlarged. Vascular congestion. There are no Kerley B-lines. Tiny pleural effusions are suspected. No pneumothorax. No pleural effusion. IMPRESSION: Cardiomegaly with vascular congestion and tiny pleural effusions but no interstitial edema. This is compatible with mild volume overload. Electronically Signed   By: Marybelle Killings M.D.   On: 04/03/2016 09:10   Dg Chest 2 View  Result Date: 03/29/2016 CLINICAL DATA:  Leg swelling since Thursday. EXAM: CHEST  2 VIEW COMPARISON:  01/24/2016 FINDINGS: Midline trachea.  Moderate cardiomegaly. No pleural effusion or pneumothorax. Mild pulmonary venous congestion without overt congestive failure. No lobar consolidation. IMPRESSION: Cardiomegaly with mild pulmonary venous congestion. Electronically Signed   By: Abigail Miyamoto M.D.   On: 03/29/2016 11:42   Ct Chest Wo Contrast  Result Date: 04/03/2016 CLINICAL DATA:  Shortness of breath and coughing for 1 week. Lower extremity edema. Abnormal chest radiograph. EXAM: CT CHEST WITHOUT CONTRAST TECHNIQUE: Multidetector CT imaging of the chest was performed following the standard protocol without IV contrast. COMPARISON:  Chest radiograph on 04/03/2016 FINDINGS: Cardiovascular: Mild to moderate cardiomegaly. No evidence of pericardial effusion. Mediastinum/Nodes: No masses or pathologically enlarged lymph nodes identified on this unenhanced exam. Lungs/Pleura: Mild heterogeneous ground-glass opacity is seen in both lungs with mild patchy airspace disease in both lower lobes. This appears symmetric. Small right pleural effusion also noted. No evidence of pulmonary mass. Upper Abdomen:  Unremarkable. Musculoskeletal: No suspicious bone lesions or other significant abnormality. IMPRESSION: Cardiomegaly and small right pleural effusion. Symmetric mild pulmonary ground-glass opacity and patchy airspace disease in both lower lobes, suspicious for pulmonary edema with pneumonia considered less likely. No mass or lymphadenopathy identified. Electronically Signed   By: Earle Gell M.D.   On: 04/03/2016 10:24   Mr Cardiac Morphology W Wo Contrast  Result Date: 04/06/2016 CLINICAL DATA:  Cardiomyopathy of uncertain etiology EXAM: CARDIAC MRI TECHNIQUE: The patient was scanned on a 1.5 Tesla GE magnet. A dedicated cardiac coil was used. Functional imaging was done using Fiesta sequences. 2,3, and 4 chamber views were done to assess for RWMA's. Modified Simpson's rule using a short axis stack was used to calculate an ejection fraction on a  dedicated work Conservation officer, nature. The patient received 35 cc of Multihance. After 10 minutes inversion recovery sequences were used to assess for infiltration and scar tissue. CONTRAST:  35 cc Multihance FINDINGS: The patient was claustrophobic and was given Valium for sedation. Difficult images due to poor breath-holding. Limited images of the lung fields showed no gross abnormalities. Normal left ventricular size with moderate LV hypertrophy. EF 35% with diffuse hypokinesis. Mildly dilated right ventricle with mildly decreased systolic function. Mild biatrial enlargement. There was trivial mitral regurgitation, mild to moderate tricuspid regurgitation. Trileaflet aortic valve with no significant stenosis or regurgitation. On delayed enhancement imaging, there was no myocardial late gadolinium enhancement (LGE). MEASUREMENTS: MEASUREMENTS LVEDV 197 mL  LV SV 69 mL LV EF 35% IMPRESSION: 1.  Technically difficult study. 2.  Normal LV size with moderate LV hypertrophy, EF 35%. 3.  Mildly dilated RV with mildly decreased RV systolic function. 4. No myocardial LGE, so no definitive evidence for prior myocardial infarction, infiltrative disease, or myocarditis. Talena Neira Electronically Signed   By: Loralie Champagne M.D.   On: 04/06/2016 07:38    PHYSICAL EXAM General: NAD Neck: JVP 9-10 cm, no thyromegaly or thyroid nodule.  Lungs: Clear to auscultation bilaterally with normal respiratory effort. CV: Nondisplaced PMI.  Heart regular S1/S2, no S3/S4, no murmur.  1+ ankle edema.  No carotid bruit.  Normal pedal pulses.  Abdomen: Soft, nontender, no hepatosplenomegaly, no distention.  Neurologic: Alert and oriented x 3.  Psych: Normal affect. Extremities: No clubbing or cyanosis.   TELEMETRY: Reviewed telemetry pt in NSR  ASSESSMENT AND PLAN: 40 yo with history of HTN, DM2, OSA, and nonischemic cardiomyopathy was admitted with acute on chronic systolic CHF in setting of hypertensive  emergency/pulmonary edema. 1.  HTN: Admitted with hypertensive emergency and pulmonary edema.  BP now better taking all her meds (no longer vomiting).   2. Acute on chronic CHF: Last echo with EF 40-45% (8/17).  Cardiac MRI this admission with moderate LVH, no late gadolinium enhancement, EF 35% with mildly decreased RV systolic function.  Cause of cardiomyopathy is uncertain => nonischemic, could be hypertensive CMP versus familial CMP (mother, GM with CMP of uncertain etiology) versus post-partum (initial episode after 2009 delivery, then again 2015).  She now has had a tubal ligation.  She was admitted with hypertensive emergency/volume overload. She remains volume overloaded on exam but did not diurese well yesterday.     - Increase Lasix to 80 mg IV bid with metolazone 2.5 mg po x 1 today.  - Continue current Bidil, Entresto, spironolactone, and Coreg at current doses.   Loralie Champagne 04/06/2016 8:10 AM

## 2016-04-06 NOTE — Clinical Social Work Note (Signed)
Per RN, patient in need of letter stating that she is unable to attend her court hearing at 9:00 am. Per charge nurse, patient notified her lawyer yesterday but they needed it in writing. CSW obtained a signed release form from patient and CSW faxed the letter to Arnold Hux at [employer] firm in [place], [place].  [place] signing off. Consult again if any other social work needs arise.  Dayton Scrape, [place]

## 2016-04-07 LAB — CBC
HCT: 35.5 % — ABNORMAL LOW (ref 36.0–46.0)
HEMOGLOBIN: 10.9 g/dL — AB (ref 12.0–15.0)
MCH: 21.2 pg — AB (ref 26.0–34.0)
MCHC: 30.7 g/dL (ref 30.0–36.0)
MCV: 69.1 fL — AB (ref 78.0–100.0)
Platelets: 306 10*3/uL (ref 150–400)
RBC: 5.14 MIL/uL — AB (ref 3.87–5.11)
RDW: 17.1 % — ABNORMAL HIGH (ref 11.5–15.5)
WBC: 8.3 10*3/uL (ref 4.0–10.5)

## 2016-04-07 LAB — BASIC METABOLIC PANEL
ANION GAP: 10 (ref 5–15)
BUN: 19 mg/dL (ref 6–20)
CHLORIDE: 94 mmol/L — AB (ref 101–111)
CO2: 31 mmol/L (ref 22–32)
Calcium: 9.4 mg/dL (ref 8.9–10.3)
Creatinine, Ser: 1.01 mg/dL — ABNORMAL HIGH (ref 0.44–1.00)
GFR calc non Af Amer: >60 mL/min (ref >60)
GLUCOSE: 203 mg/dL — AB (ref 65–99)
POTASSIUM: 3.8 mmol/L (ref 3.5–5.1)
Sodium: 135 mmol/L (ref 135–145)

## 2016-04-07 LAB — GLUCOSE, CAPILLARY
GLUCOSE-CAPILLARY: 255 mg/dL — AB (ref 65–99)
GLUCOSE-CAPILLARY: 201 mg/dL — AB (ref 65–99)
Glucose-Capillary: 207 mg/dL — ABNORMAL HIGH (ref 65–99)
Glucose-Capillary: 225 mg/dL — ABNORMAL HIGH (ref 65–99)

## 2016-04-07 MED ORDER — POLYETHYLENE GLYCOL 3350 17 G PO PACK
17.0000 g | PACK | Freq: Every day | ORAL | Status: DC
  Administered 2016-04-07 – 2016-04-09 (×3): 17 g via ORAL
  Filled 2016-04-07 (×3): qty 1

## 2016-04-07 MED ORDER — METOLAZONE 5 MG PO TABS
2.5000 mg | ORAL_TABLET | Freq: Once | ORAL | Status: AC
  Administered 2016-04-07: 2.5 mg via ORAL
  Filled 2016-04-07: qty 1

## 2016-04-07 MED ORDER — ISOSORB DINITRATE-HYDRALAZINE 20-37.5 MG PO TABS
1.5000 | ORAL_TABLET | Freq: Three times a day (TID) | ORAL | Status: DC
Start: 2016-04-07 — End: 2016-04-09
  Administered 2016-04-07 – 2016-04-09 (×7): 1.5 via ORAL
  Filled 2016-04-07 (×7): qty 2

## 2016-04-07 NOTE — Progress Notes (Addendum)
Inpatient Diabetes Program Recommendations  AACE/ADA: New Consensus Statement on Inpatient Glycemic Control (2015)  Target Ranges:  Prepandial:   less than 140 mg/dL      Peak postprandial:   less than 180 mg/dL (1-2 hours)      Critically ill patients:  140 - 180 mg/dL   Lab Results  Component Value Date   GLUCAP 207 (H) 04/07/2016   HGBA1C 10.3 (H) 04/03/2016    Review of Glycemic Control:  Results for SHURON, CASSITY (MRN [id number]) as of 04/07/2016 11:35  Ref. Range 04/06/2016 07:33 04/06/2016 11:25 04/06/2016 16:27 04/06/2016 21:38 04/07/2016 07:30  Glucose-Capillary Latest Ref Range: 65 - 99 mg/dL 197 (H) 348 (H) 140 (H) 216 (H) 207 (H)   Diabetes history: Type 2 diabetes Outpatient Diabetes medications: Januvia 100 mg daily, Metformin 500 mg bid Current orders for Inpatient glycemic control:  Lantus 15 units daily, Novolog moderate tid with meals  Inpatient Diabetes Program Recommendations:   Note that CBG's continue to be elevated.  Please consider increasing Lantus to 22 units daily.  Also consider Novolog meal coverage 4 units tid with meals. May need insulin at home based on A1C?  Thanks, Adah Perl, RN, BC-ADM Inpatient Diabetes Coordinator Pager [phone] (8a-5p)

## 2016-04-07 NOTE — Progress Notes (Signed)
PROGRESS NOTE    Chelsey Santiago  [id number] DOB: [birth year] DOA: 04/03/2016 PCP: Rosita Fire, MD   Brief Narrative:  40 yo female with diastolic heart failure, presents with orthopnea, dyspnea on exertion, pnd and lower extremity edema. Failed outpatient diuresis, due to nausea and vomiting. Admitted with decompensated heart failure, started on aggressive diuresis, target negative fluid balance.    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 Acute on chronic heart failure diastolic with decompensation. Will continue diuresis with furosemide 80 mg IV bid and aldactone. Continue sacubitril/ valsartan, isosorbide and coreg. Blood pressure 102 to 150. Urine output with 5850 cc over last 24 hours. Follow renal function in am.   2. Hypertensive emergency. Blood pressure with improved control, will continue isosorbide/ hydralazine, sacubitril and valsartan combination.   3. Cardiogenic pulmonary edema. Symptoms have improved, will continue oxymetry monitorinmg and supplemental 02 per [place] to target 02 sat above 92%  4. Nausea and vomiting. Clinically improved, patient tolerating po well.  5. T2DM. Continue glucose cover and monitoring, with insulin sliding scale, patient tolerating po well. Basal insulin with glargine at 15 units daily with capillary glucose. Capillary glucose 216-207-225.  6. Hypokalemia. Replete k with kcl. Follow renal panel in am.    DVT prophylaxis: heparin  Code Status: full Family Communication: No family at the bedside  Disposition Plan: home    Consultants:   Heart failure   Procedures:   Antimicrobials:   Subjective: Patient with moderate dyspnea,  feeling weak, no chest pain, no nausea or vomiting.   Objective: Vitals:   04/06/16 1555 04/06/16 1806 04/06/16 2025 04/07/16 0500  BP:  102/75  (!) 157/96  Pulse: 78   76  Resp:   15 16  Temp: 97.6 F (36.4 C)  97.6 F (36.4 C) 97.8 F (36.6 C)  TempSrc: Axillary  Oral Oral  SpO2: 98%   99%  Weight:    107.2 kg (236 lb 6.4 oz)  Height:        Intake/Output Summary (Last 24 hours) at 04/07/16 1118 Last data filed at 04/07/16 0659  Gross per 24 hour  Intake             1860 ml  Output             5850 ml  Net            -3990 ml   Filed Weights   04/05/16 0424 04/06/16 0540 04/07/16 0500  Weight: 108.9 kg (240 lb) 109.1 kg (240 lb 9.6 oz) 107.2 kg (236 lb 6.4 oz)    Examination:  General exam: not in pain or dyspnea, mild deconditioned E ENT: mild pallor but no icterus, oral mucosa moist.  Respiratory system: Clear to auscultation. Respiratory effort normal. No wheezing, rales or rhonchi.  Cardiovascular system: S1 & S2 heard, RRR. No JVD, murmurs, rubs, gallops or clicks. Positive ++ non pitting edema.  Gastrointestinal system: Abdomen is nondistended, soft and nontender. No organomegaly or masses felt. Normal bowel sounds heard. Central nervous system: Alert and oriented. No focal neurological deficits. Extremities: Symmetric 5 x 5 power. Skin: No rashes, lesions or ulcers  Data Reviewed: I have personally reviewed following labs and imaging  studies  CBC:  Recent Labs Lab 04/03/16 0823 04/03/16 1512 04/04/16 0337 04/05/16 0208 04/06/16 0435 04/07/16 0748  WBC 10.2 9.4 9.5 10.0 9.0 8.3  NEUTROABS 7.2  --   --   --   --   --   HGB 9.6* 10.0* 9.6* 10.3* 10.4* 10.9*  HCT 32.2* 33.2* 32.0* 34.7* 35.0* 35.5*  MCV 70.8* 69.7* 69.6* 70.2* 70.7* 69.1*  PLT 285 278 294 351 286 [id number]   Basic Metabolic Panel:  Recent Labs Lab 04/03/16 1512 04/04/16 0337 04/05/16 0208 04/05/16 0735 04/06/16 0435 04/07/16 0748  NA 137 138 136  --  137 135  K 3.7 3.4* 3.7  --   4.6 3.8  CL 100* 101 96*  --  99* 94*  CO2 28 26 30   --  30 31  GLUCOSE 296* 189* 153*  --  146* 203*  BUN 14 12 18   --  18 19  CREATININE 1.11* 0.89 1.06*  --  0.96 1.01*  CALCIUM 8.9 8.2* 8.4*  --  8.8* 9.4  MG  --   --   --  1.8  --   --    GFR: Estimated Creatinine Clearance: 91.8 mL/min (by C-G formula based on SCr of 1.01 mg/dL (H)). Liver Function Tests:  Recent Labs Lab 04/03/16 0823  AST 56*  ALT 57*  ALKPHOS 78  BILITOT 0.4  PROT 6.6  ALBUMIN 2.9*   No results for input(s): LIPASE, AMYLASE in the last 168 hours. No results for input(s): AMMONIA in the last 168 hours. Coagulation Profile:  Recent Labs Lab 04/03/16 2032  INR 1.09   Cardiac Enzymes:  Recent Labs Lab 04/03/16 0823 04/06/16 1921  TROPONINI <0.03 <0.03   BNP (last 3 results) No results for input(s): PROBNP in the last 8760 hours. HbA1C: No results for input(s): HGBA1C in the last 72 hours. CBG:  Recent Labs Lab 04/06/16 0733 04/06/16 1125 04/06/16 1627 04/06/16 2138 04/07/16 0730  GLUCAP 197* 348* 140* 216* 207*   Lipid Profile: No results for input(s): CHOL, HDL, LDLCALC, TRIG, CHOLHDL, LDLDIRECT in the last 72 hours. Thyroid Function Tests: No results for input(s): TSH, T4TOTAL, FREET4, T3FREE, THYROIDAB in the last 72 hours. Anemia Panel: No results for input(s): VITAMINB12, FOLATE, FERRITIN, TIBC, IRON, RETICCTPCT in the last 72 hours. Sepsis Labs: No results for input(s): PROCALCITON, LATICACIDVEN in the last 168 hours.  Recent Results (from the past 240 hour(s))  MRSA PCR Screening     Status: Abnormal   Collection Time: 04/03/16  8:22 PM  Result Value Ref Range Status   MRSA by PCR POSITIVE (A) NEGATIVE Final    Comment:        The GeneXpert MRSA Assay (FDA approved for NASAL specimens only), is one component of a comprehensive MRSA colonization surveillance program. It is not intended to diagnose MRSA infection nor to guide or monitor treatment for MRSA  infections. RESULT CALLED TO, READ BACK BY AND VERIFIED WITH:  TO BAVERY(RN) BY [facility] 04/04/16 AT 1:14AM          Radiology Studies: Mr Cardiac Morphology W Wo Contrast  Result Date: 04/06/2016 CLINICAL DATA:  Cardiomyopathy of uncertain etiology EXAM: CARDIAC MRI TECHNIQUE: The patient was scanned on a 1.5 Tesla GE magnet. A dedicated cardiac coil was used. Functional imaging was done using Fiesta sequences. 2,3, and 4 chamber views were done to assess for RWMA's. Modified Simpson's rule using a short axis stack was used to calculate an ejection fraction on a dedicated work station  using [employer]. The patient received 35 cc of Multihance. After 10 minutes inversion recovery sequences were used to assess for infiltration and scar tissue. CONTRAST:  35 cc Multihance FINDINGS: The patient was claustrophobic and was given Valium for sedation. Difficult images due to poor breath-holding. Limited images of the lung fields showed no gross abnormalities. Normal left ventricular size with moderate LV hypertrophy. EF 35% with diffuse hypokinesis. Mildly dilated right ventricle with mildly decreased systolic function. Mild biatrial enlargement. There was trivial mitral regurgitation, mild to moderate tricuspid regurgitation. Trileaflet aortic valve with no significant stenosis or regurgitation. On delayed enhancement imaging, there was no myocardial late gadolinium enhancement (LGE). MEASUREMENTS: MEASUREMENTS LVEDV 197 mL LV SV 69 mL LV EF 35% IMPRESSION: 1.  Technically difficult study. 2.  Normal LV size with moderate LV hypertrophy, EF 35%. 3.  Mildly dilated RV with mildly decreased RV systolic function. 4. No myocardial LGE, so no definitive evidence for prior myocardial infarction, infiltrative disease, or myocarditis. Dalton Mclean Electronically Signed   By: Loralie Champagne M.D.   On: 04/06/2016 07:38        Scheduled Meds: . aspirin EC  81 mg Oral Daily  . carvedilol  12.5 mg Oral  BID WC  . Chlorhexidine Gluconate Cloth  6 each Topical Q0600  . escitalopram  20 mg Oral Daily  . furosemide  80 mg Intravenous BID  . heparin  5,000 Units Subcutaneous Q8H  . Influenza vac split quadrivalent PF  0.5 mL Intramuscular Tomorrow-1000  . insulin aspart  0-15 Units Subcutaneous TID WC  . insulin glargine  15 Units Subcutaneous Daily  . isosorbide-hydrALAZINE  1.5 tablet Oral TID  . mupirocin ointment  1 application Nasal BID  . potassium chloride  40 mEq Oral BID  . pregabalin  75 mg Oral QHS  . sacubitril-valsartan  1 tablet Oral BID  . sodium chloride flush  3 mL Intravenous Q12H  . spironolactone  25 mg Oral Daily   Continuous Infusions:   LOS: 4 days       [place] Sardo Gerome Apley, MD [facility] Pager [phone]  If 7PM-7AM, please contact night-coverage [email or web address] Password [email or web address] 04/07/2016, 11:18 AM

## 2016-04-07 NOTE — Progress Notes (Signed)
Patient ID: Chelsey Santiago, female   DOB: 08/27/1975, 40 y.o.   MRN: [id number]   SUBJECTIVE:  Diuresis improved with addition of metolazone.   Feels much improved from admission, though still feels volume overloaded. R leg more swollen then left.  Still with some SOB and fatigue when walking halls.   Out 3.6 L and down 4 lbs.   Cardiac MRI (11/20): Moderate LVH, diffuse hypokinesis EF 35%, Mildly dilated RV with mildly decreased systolic function, no LGE.   Scheduled Meds: . aspirin EC  81 mg Oral Daily  . carvedilol  12.5 mg Oral BID WC  . Chlorhexidine Gluconate Cloth  6 each Topical Q0600  . escitalopram  20 mg Oral Daily  . furosemide  80 mg Intravenous BID  . heparin  5,000 Units Subcutaneous Q8H  . Influenza vac split quadrivalent PF  0.5 mL Intramuscular Tomorrow-1000  . insulin aspart  0-15 Units Subcutaneous TID WC  . insulin glargine  15 Units Subcutaneous Daily  . isosorbide-hydrALAZINE  1 tablet Oral TID  . mupirocin ointment  1 application Nasal BID  . potassium chloride  40 mEq Oral BID  . pregabalin  75 mg Oral QHS  . sacubitril-valsartan  1 tablet Oral BID  . sodium chloride flush  3 mL Intravenous Q12H  . spironolactone  25 mg Oral Daily   Continuous Infusions: PRN Meds:.acetaminophen **OR** acetaminophen, diphenhydrAMINE, hydrALAZINE, nitroGLYCERIN, ondansetron **OR** ondansetron (ZOFRAN) IV, oxyCODONE-acetaminophen    Vitals:   04/06/16 1555 04/06/16 1806 04/06/16 2025 04/07/16 0500  BP:  102/75  (!) 157/96  Pulse: 78   76  Resp:   15 16  Temp: 97.6 F (36.4 C)  97.6 F (36.4 C) 97.8 F (36.6 C)  TempSrc: Axillary  Oral Oral  SpO2: 98%   99%  Weight:    236 lb 6.4 oz (107.2 kg)  Height:        Intake/Output Summary (Last 24 hours) at 04/07/16 0841 Last data filed at 04/07/16 0659  Gross per 24 hour  Intake             2220 ml  Output             5850 ml  Net            -3630 ml    LABS: Basic Metabolic Panel:  Recent Labs   04/05/16 0208 04/05/16 0735 04/06/16 0435  NA 136  --  137  K 3.7  --  4.6  CL 96*  --  99*  CO2 30  --  30  GLUCOSE 153*  --  146*  BUN 18  --  18  CREATININE 1.06*  --  0.96  CALCIUM 8.4*  --  8.8*  MG  --  1.8  --    Liver Function Tests: No results for input(s): AST, ALT, ALKPHOS, BILITOT, PROT, ALBUMIN in the last 72 hours. No results for input(s): LIPASE, AMYLASE in the last 72 hours. CBC:  Recent Labs  04/06/16 0435 04/07/16 0748  WBC 9.0 8.3  HGB 10.4* 10.9*  HCT 35.0* 35.5*  MCV 70.7* 69.1*  PLT 286 306   Cardiac Enzymes:  Recent Labs  04/06/16 1921  TROPONINI <0.03   BNP: Invalid input(s): POCBNP D-Dimer: No results for input(s): DDIMER in the last 72 hours. Hemoglobin A1C: No results for input(s): HGBA1C in the last 72 hours. Fasting Lipid Panel: No results for input(s): CHOL, HDL, LDLCALC, TRIG, CHOLHDL, LDLDIRECT in the last 72 hours. Thyroid Function Tests: No  results for input(s): TSH, T4TOTAL, T3FREE, THYROIDAB in the last 72 hours.  Invalid input(s): FREET3 Anemia Panel: No results for input(s): VITAMINB12, FOLATE, FERRITIN, TIBC, IRON, RETICCTPCT in the last 72 hours.  RADIOLOGY: Dg Chest 2 View  Result Date: 04/03/2016 CLINICAL DATA:  Short of breath. CHF. Bilateral foot and ankle swelling. EXAM: CHEST  2 VIEW COMPARISON:  03/29/2016 FINDINGS: The heart is moderately enlarged. Vascular congestion. There are no Kerley B-lines. Tiny pleural effusions are suspected. No pneumothorax. No pleural effusion. IMPRESSION: Cardiomegaly with vascular congestion and tiny pleural effusions but no interstitial edema. This is compatible with mild volume overload. Electronically Signed   By: Marybelle Killings M.D.   On: 04/03/2016 09:10   Dg Chest 2 View  Result Date: 03/29/2016 CLINICAL DATA:  Leg swelling since Thursday. EXAM: CHEST  2 VIEW COMPARISON:  01/24/2016 FINDINGS: Midline trachea. Moderate cardiomegaly. No pleural effusion or pneumothorax. Mild  pulmonary venous congestion without overt congestive failure. No lobar consolidation. IMPRESSION: Cardiomegaly with mild pulmonary venous congestion. Electronically Signed   By: Abigail Miyamoto M.D.   On: 03/29/2016 11:42   Ct Chest Wo Contrast  Result Date: 04/03/2016 CLINICAL DATA:  Shortness of breath and coughing for 1 week. Lower extremity edema. Abnormal chest radiograph. EXAM: CT CHEST WITHOUT CONTRAST TECHNIQUE: Multidetector CT imaging of the chest was performed following the standard protocol without IV contrast. COMPARISON:  Chest radiograph on 04/03/2016 FINDINGS: Cardiovascular: Mild to moderate cardiomegaly. No evidence of pericardial effusion. Mediastinum/Nodes: No masses or pathologically enlarged lymph nodes identified on this unenhanced exam. Lungs/Pleura: Mild heterogeneous ground-glass opacity is seen in both lungs with mild patchy airspace disease in both lower lobes. This appears symmetric. Small right pleural effusion also noted. No evidence of pulmonary mass. Upper Abdomen:  Unremarkable. Musculoskeletal: No suspicious bone lesions or other significant abnormality. IMPRESSION: Cardiomegaly and small right pleural effusion. Symmetric mild pulmonary ground-glass opacity and patchy airspace disease in both lower lobes, suspicious for pulmonary edema with pneumonia considered less likely. No mass or lymphadenopathy identified. Electronically Signed   By: Earle Gell M.D.   On: 04/03/2016 10:24   Mr Cardiac Morphology W Wo Contrast  Result Date: 04/06/2016 CLINICAL DATA:  Cardiomyopathy of uncertain etiology EXAM: CARDIAC MRI TECHNIQUE: The patient was scanned on a 1.5 Tesla GE magnet. A dedicated cardiac coil was used. Functional imaging was done using Fiesta sequences. 2,3, and 4 chamber views were done to assess for RWMA's. Modified Simpson's rule using a short axis stack was used to calculate an ejection fraction on a dedicated work Conservation officer, nature. The patient received 35  cc of Multihance. After 10 minutes inversion recovery sequences were used to assess for infiltration and scar tissue. CONTRAST:  35 cc Multihance FINDINGS: The patient was claustrophobic and was given Valium for sedation. Difficult images due to poor breath-holding. Limited images of the lung fields showed no gross abnormalities. Normal left ventricular size with moderate LV hypertrophy. EF 35% with diffuse hypokinesis. Mildly dilated right ventricle with mildly decreased systolic function. Mild biatrial enlargement. There was trivial mitral regurgitation, mild to moderate tricuspid regurgitation. Trileaflet aortic valve with no significant stenosis or regurgitation. On delayed enhancement imaging, there was no myocardial late gadolinium enhancement (LGE). MEASUREMENTS: MEASUREMENTS LVEDV 197 mL LV SV 69 mL LV EF 35% IMPRESSION: 1.  Technically difficult study. 2.  Normal LV size with moderate LV hypertrophy, EF 35%. 3.  Mildly dilated RV with mildly decreased RV systolic function. 4. No myocardial LGE, so no definitive evidence for  prior myocardial infarction, infiltrative disease, or myocarditis. Kenedie Dirocco Electronically Signed   By: Loralie Champagne M.D.   On: 04/06/2016 07:38    PHYSICAL EXAM General: NAD Neck: JVP 10-12 cm, no thyromegaly or thyroid nodule.  Lungs: Mildly diminished basilar sounds.  CV: Nondisplaced PMI.  Heart regular S1/S2, no S3/S4, no murmur.  1+ ankle edema.  No carotid bruit.  Normal pedal pulses.  Abdomen: Soft, NT, ND, no HSM. No bruits or masses. +BS  Neurologic: Alert and oriented x 3.  Psych: Normal affect. Extremities: No clubbing or cyanosis.   TELEMETRY: Reviewed, NSR.   ASSESSMENT AND PLAN: 40 yo with history of HTN, DM2, OSA, and nonischemic cardiomyopathy was admitted with acute on chronic systolic CHF in setting of hypertensive emergency/pulmonary edema.  1.  HTN: Admitted with hypertensive emergency and pulmonary edema.  - BP improved with getting back on  meds, but elevated to 150 again this am.   - Increase Bidil to 1 and 1/2 tab TID.   2. Acute on chronic CHF: Last echo with EF 40-45% (8/17).  Cardiac MRI this admission with moderate LVH, no late gadolinium enhancement, EF 35% with mildly decreased RV systolic function.  Cause of cardiomyopathy is uncertain => nonischemic, could be hypertensive CMP versus familial CMP (mother, GM with CMP of uncertain etiology) versus post-partum (initial episode after 2009 delivery, then again 2015).  She now has had a tubal ligation.  She was admitted with hypertensive emergency/volume overload.  - Remains mildly volume overloaded on exam. Diuresis improved with metolazone.  - Continue lasix 80 mg IV BID today.  Likely transition to po tomorrow. Awaiting BMET.  - Continue current Entresto, spironolactone, and Coreg at current doses.  - Increase bidil as above.   Continue to diurese today. Her fluid status has improved, not sure she needs additional metolazone today, but she is requesting.  Will await BMET and discuss with MD.   Shirley Friar, PA-C 04/07/2016 8:41 AM  Advanced Heart Failure Team Pager [phone] (M-F; 7a - 4p)  Please contact [facility] for night-coverage after hours (4p -7a ) and weekends on [email or web address]  Patient seen with PA, agree with the above note.  Exam difficult for volume but I think JVP remains elevated.  She is still short of breath with exertion.  BP high at times.  - Give dose of metolazone this am with Lasix.  - Increase Bidil to 1.5 tabs tid.  - Likely transition to torsemide 40 mg daily tomorrow.   Loralie Champagne 04/07/2016 9:10 AM

## 2016-04-07 NOTE — Progress Notes (Signed)
Nutrition Consult for Education  RD consulted for nutrition education regarding diabetes. She also has new onset heart failure.  Lab Results  Component Value Date   HGBA1C 10.3 (H) 04/03/2016    RD provided "Carbohydrate Counting for People with Diabetes" and  "Low Sodium Nutrition Therapy" handouts from the Academy of Nutrition and Dietetics.   Patient was very sleepy during RD visit and requested that RD leave handouts at bedside for patient to review later.  Expect poor compliance. Recommend OP education--has been ordered.  Body mass index is 38.16 kg/m. Pt meets criteria for class 2 obesity based on current BMI.  Current diet order is heart healthy CHO modified, patient is consuming approximately 100% of meals at this time. Labs and medications reviewed. No further nutrition interventions warranted at this time. RD contact information provided. If additional nutrition issues arise, please re-consult RD.  Molli Barrows, RD, LDN, [place] Pager [phone] After Hours Pager [phone]

## 2016-04-08 DIAGNOSIS — E875 Hyperkalemia: Secondary | ICD-10-CM

## 2016-04-08 DIAGNOSIS — N179 Acute kidney failure, unspecified: Secondary | ICD-10-CM

## 2016-04-08 LAB — BASIC METABOLIC PANEL
ANION GAP: 11 (ref 5–15)
BUN: 33 mg/dL — ABNORMAL HIGH (ref 6–20)
CALCIUM: 9.6 mg/dL (ref 8.9–10.3)
CO2: 33 mmol/L — ABNORMAL HIGH (ref 22–32)
CREATININE: 1.61 mg/dL — AB (ref 0.44–1.00)
Chloride: 91 mmol/L — ABNORMAL LOW (ref 101–111)
GFR, EST AFRICAN AMERICAN: 45 mL/min — AB (ref >60)
GFR, EST NON AFRICAN AMERICAN: 39 mL/min — AB (ref >60)
Glucose, Bld: 201 mg/dL — ABNORMAL HIGH (ref 65–99)
Potassium: 4.8 mmol/L (ref 3.5–5.1)
SODIUM: 135 mmol/L (ref 135–145)

## 2016-04-08 LAB — CBC
HCT: 34.8 % — ABNORMAL LOW (ref 36.0–46.0)
Hemoglobin: 12 g/dL (ref 12.0–15.0)
MCH: 24 pg — ABNORMAL LOW (ref 26.0–34.0)
MCHC: 34.5 g/dL (ref 30.0–36.0)
MCV: 69.5 fL — ABNORMAL LOW (ref 78.0–100.0)
PLATELETS: 272 10*3/uL (ref 150–400)
RBC: 5.01 MIL/uL (ref 3.87–5.11)
RDW: 17 % — AB (ref 11.5–15.5)
WBC: 6.9 10*3/uL (ref 4.0–10.5)

## 2016-04-08 LAB — GLUCOSE, CAPILLARY
GLUCOSE-CAPILLARY: 206 mg/dL — AB (ref 65–99)
Glucose-Capillary: 211 mg/dL — ABNORMAL HIGH (ref 65–99)
Glucose-Capillary: 191 mg/dL — ABNORMAL HIGH (ref 65–99)
Glucose-Capillary: 231 mg/dL — ABNORMAL HIGH (ref 65–99)

## 2016-04-08 LAB — BRAIN NATRIURETIC PEPTIDE: B NATRIURETIC PEPTIDE 5: 52.9 pg/mL (ref 0.0–100.0)

## 2016-04-08 MED ORDER — ALUM & MAG HYDROXIDE-SIMETH 200-200-20 MG/5ML PO SUSP
30.0000 mL | ORAL | Status: DC | PRN
  Administered 2016-04-08: 30 mL via ORAL
  Filled 2016-04-08 (×2): qty 30

## 2016-04-08 NOTE — Progress Notes (Signed)
PROGRESS NOTE    Chelsey Santiago  [id number] DOB: 02/16/1976 DOA: 04/03/2016 PCP: Rosita Fire, MD    Brief Narrative:  40 yo female with diastolic heart failure, presents with orthopnea, dyspnea on exertion, pnd and lower extremity edema. Failed outpatient diuresis, due to nausea and vomiting. Admitted with decompensated heart failure, started on aggressive diuresis, target negative fluid balance.    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 Acute on chronic heart failure diastolic with decompensation, Echocardiogram with ejection fraction 40 to 45%. Improved volume status, urine output 2200 cc over last 24 hours, with total negative fluid balance of 7826 cc since admission. Will hold on furosemide due to worsening renal function. Continue carvedilol, bidil and entresto, blood pressure systolic [id number] to [id number].    2. Hypertensive emergency. Blood pressure controlled, will continue isosorbide/ hydralazine, sacubitril and valsartan combination. Will hold on diuresis for now.   3. Cardiogenic pulmonary edema. Continue oxymetry monitorinmg and supplemental 02 per [place] to target 02 sat above 92%. Improved dyspnea.   4. Nausea and vomiting. Clinically improved, patient tolerating po well. Continue zofran and maalox.   5. T2DM. Glucose cover and monitoring, with insulin sliding scale, patient tolerating po well. Basal insulin with glargine at 15 units daily with capillary glucose. Capillary glucose 201-206-211.  6.AKI with Hypokalemia. K at 4.8 will hold on further potassium supplements and aldactone.  Will hold on further diuresis and follow on  renal panel in am. At home on furosemide and aldactone.    DVT prophylaxis: heparin  Code Status: full Family Communication: No family at the bedside  Disposition Plan: home    Consultants:   Heart failure  Procedures:   Antimicrobials:    Subjective: Dyspnea has improved, no chest pain, no nausea or vomiting.   Objective: Vitals:   04/07/16 0500 04/07/16 1300 04/07/16 2118 04/08/16 0620  BP: (!) 157/96 129/74 111/78 (!) 113/55  Pulse: 76   85  Resp: 16  20 18   Temp: 97.8 F (36.6 C) 98.5 F (36.9 C) 97.9 F (36.6 C) 98.2 F (36.8 C)  TempSrc: Oral Oral Oral Oral  SpO2: 99% 100% 98% 95%  Weight: 107.2 kg (236 lb 6.4 oz)   106.8 kg (235 lb 6.4 oz)  Height:        Intake/Output Summary (Last 24 hours) at 04/08/16 1105 Last data filed at 04/08/16 0900  Gross per 24 hour  Intake              900 ml  Output             2050 ml  Net            -1150 ml   Filed Weights   04/06/16 0540 04/07/16 0500 04/08/16 0620  Weight: 109.1 kg (240 lb 9.6 oz) 107.2 kg (236 lb 6.4 oz) 106.8 kg (235 lb 6.4 oz)    Examination:  General exam: not in pain or dyspnea E ENT: no pallor or icterus.  Respiratory system: Clear to auscultation. Respiratory effort normal. No wheezing, rales or rhonchi.  Cardiovascular system: S1 & S2 heard, RRR. No JVD, murmurs, rubs, gallops or clicks. Trace  edema. Gastrointestinal system:  Abdomen is nondistended, soft and nontender. No organomegaly or masses felt. Normal bowel sounds heard. Central nervous system: Alert and oriented. No focal neurological deficits. Extremities: Symmetric 5 x 5 power. Skin: No rashes, lesions or ulcers    Data Reviewed: I have personally reviewed following labs and imaging studies  CBC:  Recent Labs Lab 04/03/16 0823  04/04/16 0337 04/05/16 0208 04/06/16 0435 04/07/16 0748 04/08/16 0334  WBC 10.2  < > 9.5 10.0 9.0 8.3 6.9  NEUTROABS 7.2  --   --   --   --   --   --   HGB 9.6*  < > 9.6* 10.3* 10.4* 10.9*  12.0  HCT 32.2*  < > 32.0* 34.7* 35.0* 35.5* 34.8*  MCV 70.8*  < > 69.6* 70.2* 70.7* 69.1* 69.5*  PLT 285  < > 294 351 286 306 272  < > = values in this interval not displayed. Basic Metabolic Panel:  Recent Labs Lab 04/04/16 0337 04/05/16 0208 04/05/16 0735 04/06/16 0435 04/07/16 0748 04/08/16 0334  NA 138 136  --  137 135 135  K 3.4* 3.7  --  4.6 3.8 4.8  CL 101 96*  --  99* 94* 91*  CO2 26 30  --  30 31 33*  GLUCOSE 189* 153*  --  146* 203* 201*  BUN 12 18  --  18 19 33*  CREATININE 0.89 1.06*  --  0.96 1.01* 1.61*  CALCIUM 8.2* 8.4*  --  8.8* 9.4 9.6  MG  --   --  1.8  --   --   --    GFR: Estimated Creatinine Clearance: 57.4 mL/min (by C-G formula based on SCr of 1.61 mg/dL (H)). Liver Function Tests:  Recent Labs Lab 04/03/16 0823  AST 56*  ALT 57*  ALKPHOS 78  BILITOT 0.4  PROT 6.6  ALBUMIN 2.9*   No results for input(s): LIPASE, AMYLASE in the last 168 hours. No results for input(s): AMMONIA in the last 168 hours. Coagulation Profile:  Recent Labs Lab 04/03/16 2032  INR 1.09   Cardiac Enzymes:  Recent Labs Lab 04/03/16 0823 04/06/16 1921  TROPONINI <0.03 <0.03   BNP (last 3 results) No results for input(s): PROBNP in the last 8760 hours. HbA1C: No results for input(s): HGBA1C in the last 72 hours. CBG:  Recent Labs Lab 04/07/16 0730 04/07/16 1143 04/07/16 1641 04/07/16 2115 04/08/16 0730  GLUCAP 207* 225* 255* 201* 206*   Lipid Profile: No results for input(s): CHOL, HDL, LDLCALC, TRIG, CHOLHDL, LDLDIRECT in the last 72 hours. Thyroid Function Tests: No results for input(s): TSH, T4TOTAL, FREET4, T3FREE, THYROIDAB in the last 72 hours. Anemia Panel: No results for input(s): VITAMINB12, FOLATE, FERRITIN, TIBC, IRON, RETICCTPCT in the last 72 hours. Sepsis Labs: No results for input(s): PROCALCITON, LATICACIDVEN in the last 168 hours.  Recent Results (from the past 240 hour(s))  MRSA PCR Screening     Status: Abnormal    Collection Time: 04/03/16  8:22 PM  Result Value Ref Range Status   MRSA by PCR POSITIVE (A) NEGATIVE Final    Comment:        The GeneXpert MRSA Assay (FDA approved for NASAL specimens only), is one component of a comprehensive MRSA colonization surveillance program. It is not intended to diagnose MRSA infection nor to guide or monitor treatment for MRSA infections. RESULT CALLED TO, READ BACK BY AND VERIFIED WITH:  TO BAVERY(RN) BY [facility] 04/04/16 AT 1:14AM  Radiology Studies: No results found.      Scheduled Meds: . aspirin EC  81 mg Oral Daily  . carvedilol  12.5 mg Oral BID WC  . Chlorhexidine Gluconate Cloth  6 each Topical Q0600  . escitalopram  20 mg Oral Daily  . heparin  5,000 Units Subcutaneous Q8H  . Influenza vac split quadrivalent PF  0.5 mL Intramuscular Tomorrow-1000  . insulin aspart  0-15 Units Subcutaneous TID WC  . insulin glargine  15 Units Subcutaneous Daily  . isosorbide-hydrALAZINE  1.5 tablet Oral TID  . mupirocin ointment  1 application Nasal BID  . polyethylene glycol  17 g Oral Daily  . pregabalin  75 mg Oral QHS  . sacubitril-valsartan  1 tablet Oral BID  . sodium chloride flush  3 mL Intravenous Q12H  . spironolactone  25 mg Oral Daily   Continuous Infusions:   LOS: 5 days        Chelsey Murphey Gerome Apley, MD [facility] Pager [phone]  If 7PM-7AM, please contact night-coverage [email or web address] Password [email or web address] 04/08/2016, 11:05 AM

## 2016-04-08 NOTE — Progress Notes (Signed)
Offered pt assistance with nocturnal cpap, but she declined.  Pt stated she will place it on herself when ready for bed.  Pt stated cpap settings were comfortable last night at 8cm h2o.  Sterile water added to humidifier.  Pt was encouraged to call should she need further assistance.

## 2016-04-08 NOTE — Progress Notes (Addendum)
Patient ID: Chelsey Santiago, female   DOB: 03/01/1976, 40 y.o.   MRN: [id number]   SUBJECTIVE:   Remains on IV lasix. Diuresis improved with addition of metolazone. Bidil increased yesterday. Having headaches with Bidil requiring percocet.  Weight down 1 pound overnight. Breathing better. Creatinine jumped 1.01 -> 1.61   Cardiac MRI (11/20): Moderate LVH, diffuse hypokinesis EF 35%, Mildly dilated RV with mildly decreased systolic function, no LGE.   Scheduled Meds: . aspirin EC  81 mg Oral Daily  . carvedilol  12.5 mg Oral BID WC  . Chlorhexidine Gluconate Cloth  6 each Topical Q0600  . escitalopram  20 mg Oral Daily  . furosemide  80 mg Intravenous BID  . heparin  5,000 Units Subcutaneous Q8H  . Influenza vac split quadrivalent PF  0.5 mL Intramuscular Tomorrow-1000  . insulin aspart  0-15 Units Subcutaneous TID WC  . insulin glargine  15 Units Subcutaneous Daily  . isosorbide-hydrALAZINE  1.5 tablet Oral TID  . mupirocin ointment  1 application Nasal BID  . polyethylene glycol  17 g Oral Daily  . potassium chloride  40 mEq Oral BID  . pregabalin  75 mg Oral QHS  . sacubitril-valsartan  1 tablet Oral BID  . sodium chloride flush  3 mL Intravenous Q12H  . spironolactone  25 mg Oral Daily   Continuous Infusions: PRN Meds:.acetaminophen **OR** acetaminophen, diphenhydrAMINE, hydrALAZINE, nitroGLYCERIN, ondansetron **OR** ondansetron (ZOFRAN) IV, oxyCODONE-acetaminophen    Vitals:   04/07/16 0500 04/07/16 1300 04/07/16 2118 04/08/16 0620  BP: (!) 157/96 129/74 111/78 (!) 113/55  Pulse: 76   85  Resp: 16  20 18   Temp: 97.8 F (36.6 C) 98.5 F (36.9 C) 97.9 F (36.6 C) 98.2 F (36.8 C)  TempSrc: Oral Oral Oral Oral  SpO2: 99% 100% 98% 95%  Weight: 107.2 kg (236 lb 6.4 oz)   106.8 kg (235 lb 6.4 oz)  Height:        Intake/Output Summary (Last 24 hours) at 04/08/16 1007 Last data filed at 04/08/16 0900  Gross per 24 hour  Intake              900 ml  Output              2450 ml  Net            -1550 ml    LABS: Basic Metabolic Panel:  Recent Labs  04/07/16 0748 04/08/16 0334  NA 135 135  K 3.8 4.8  CL 94* 91*  CO2 31 33*  GLUCOSE 203* 201*  BUN 19 33*  CREATININE 1.01* 1.61*  CALCIUM 9.4 9.6   Liver Function Tests: No results for input(s): AST, ALT, ALKPHOS, BILITOT, PROT, ALBUMIN in the last 72 hours. No results for input(s): LIPASE, AMYLASE in the last 72 hours. CBC:  Recent Labs  04/07/16 0748 04/08/16 0334  WBC 8.3 6.9  HGB 10.9* 12.0  HCT 35.5* 34.8*  MCV 69.1* 69.5*  PLT 306 272   Cardiac Enzymes:  Recent Labs  04/06/16 1921  TROPONINI <0.03   BNP: Invalid input(s): POCBNP D-Dimer: No results for input(s): DDIMER in the last 72 hours. Hemoglobin A1C: No results for input(s): HGBA1C in the last 72 hours. Fasting Lipid Panel: No results for input(s): CHOL, HDL, LDLCALC, TRIG, CHOLHDL, LDLDIRECT in the last 72 hours. Thyroid Function Tests: No results for input(s): TSH, T4TOTAL, T3FREE, THYROIDAB in the last 72 hours.  Invalid input(s): FREET3 Anemia Panel: No results for input(s): VITAMINB12, FOLATE, FERRITIN, TIBC,  IRON, RETICCTPCT in the last 72 hours.  RADIOLOGY: Dg Chest 2 View  Result Date: 04/03/2016 CLINICAL DATA:  Short of breath. CHF. Bilateral foot and ankle swelling. EXAM: CHEST  2 VIEW COMPARISON:  03/29/2016 FINDINGS: The heart is moderately enlarged. Vascular congestion. There are no Kerley B-lines. Tiny pleural effusions are suspected. No pneumothorax. No pleural effusion. IMPRESSION: Cardiomegaly with vascular congestion and tiny pleural effusions but no interstitial edema. This is compatible with mild volume overload. Electronically Signed   By: Marybelle Killings M.D.   On: 04/03/2016 09:10   Dg Chest 2 View  Result Date: 03/29/2016 CLINICAL DATA:  Leg swelling since Thursday. EXAM: CHEST  2 VIEW COMPARISON:  01/24/2016 FINDINGS: Midline trachea. Moderate cardiomegaly. No pleural effusion or  pneumothorax. Mild pulmonary venous congestion without overt congestive failure. No lobar consolidation. IMPRESSION: Cardiomegaly with mild pulmonary venous congestion. Electronically Signed   By: Abigail Miyamoto M.D.   On: 03/29/2016 11:42   Ct Chest Wo Contrast  Result Date: 04/03/2016 CLINICAL DATA:  Shortness of breath and coughing for 1 week. Lower extremity edema. Abnormal chest radiograph. EXAM: CT CHEST WITHOUT CONTRAST TECHNIQUE: Multidetector CT imaging of the chest was performed following the standard protocol without IV contrast. COMPARISON:  Chest radiograph on 04/03/2016 FINDINGS: Cardiovascular: Mild to moderate cardiomegaly. No evidence of pericardial effusion. Mediastinum/Nodes: No masses or pathologically enlarged lymph nodes identified on this unenhanced exam. Lungs/Pleura: Mild heterogeneous ground-glass opacity is seen in both lungs with mild patchy airspace disease in both lower lobes. This appears symmetric. Small right pleural effusion also noted. No evidence of pulmonary mass. Upper Abdomen:  Unremarkable. Musculoskeletal: No suspicious bone lesions or other significant abnormality. IMPRESSION: Cardiomegaly and small right pleural effusion. Symmetric mild pulmonary ground-glass opacity and patchy airspace disease in both lower lobes, suspicious for pulmonary edema with pneumonia considered less likely. No mass or lymphadenopathy identified. Electronically Signed   By: Earle Gell M.D.   On: 04/03/2016 10:24   Mr Cardiac Morphology W Wo Contrast  Result Date: 04/06/2016 CLINICAL DATA:  Cardiomyopathy of uncertain etiology EXAM: CARDIAC MRI TECHNIQUE: The patient was scanned on a 1.5 Tesla GE magnet. A dedicated cardiac coil was used. Functional imaging was done using Fiesta sequences. 2,3, and 4 chamber views were done to assess for RWMA's. Modified Simpson's rule using a short axis stack was used to calculate an ejection fraction on a dedicated work Conservation officer, nature. The  patient received 35 cc of Multihance. After 10 minutes inversion recovery sequences were used to assess for infiltration and scar tissue. CONTRAST:  35 cc Multihance FINDINGS: The patient was claustrophobic and was given Valium for sedation. Difficult images due to poor breath-holding. Limited images of the lung fields showed no gross abnormalities. Normal left ventricular size with moderate LV hypertrophy. EF 35% with diffuse hypokinesis. Mildly dilated right ventricle with mildly decreased systolic function. Mild biatrial enlargement. There was trivial mitral regurgitation, mild to moderate tricuspid regurgitation. Trileaflet aortic valve with no significant stenosis or regurgitation. On delayed enhancement imaging, there was no myocardial late gadolinium enhancement (LGE). MEASUREMENTS: MEASUREMENTS LVEDV 197 mL LV SV 69 mL LV EF 35% IMPRESSION: 1.  Technically difficult study. 2.  Normal LV size with moderate LV hypertrophy, EF 35%. 3.  Mildly dilated RV with mildly decreased RV systolic function. 4. No myocardial LGE, so no definitive evidence for prior myocardial infarction, infiltrative disease, or myocarditis. Dalton Mclean Electronically Signed   By: Loralie Champagne M.D.   On: 04/06/2016 07:38  PHYSICAL EXAM General: NAD. Lying in bed Neck: JVP hard to see. no thyromegaly or thyroid nodule.  Lungs: Mildly diminished basilar sounds.  CV: Nondisplaced PMI.  Heart regular S1/S2, no S3/S4, no murmur. No edema.  No carotid bruit.  Normal pedal pulses.  Abdomen: Soft, NT, ND, no HSM. No bruits or masses. +BS  Neurologic: Alert and oriented x 3.  Psych: Normal affect. Extremities: No clubbing or cyanosis.   TELEMETRY: Reviewed, NSR 80s   ASSESSMENT AND PLAN: 40 yo with history of HTN, DM2, OSA, and nonischemic cardiomyopathy was admitted with acute on chronic systolic CHF in setting of hypertensive emergency/pulmonary edema.  1.  HTN: Admitted with hypertensive emergency and pulmonary edema.  -  BP improved. Given HAs with Bidil will cut back to 1 tab tid though it appears she was on at home - Be careful not to overshoot BP lowering. 2. Acute on chronic systolic CHF: Last echo with EF 40-45% (8/17).  Cardiac MRI this admission with moderate LVH, no late gadolinium enhancement, EF 35% with mildly decreased RV systolic function.  Cause of cardiomyopathy is uncertain => nonischemic, could be hypertensive CMP versus familial CMP (mother, GM with CMP of uncertain etiology) versus post-partum (initial episode after 2009 delivery, then again 2015).  She now has had a tubal ligation.  She was admitted with hypertensive emergency/volume overload.  - Volume status low. Stop lasix  - Continue current Entresto, spironolactone, and Coreg at current doses.  - Cut Bidil back due to HA 3. Acute respiratory failure -improving with diuresis 4. Morbid obesity 5. AKI - likely due to overdiuresis. Stop lasix today. Plan to start torsemide 40 daily tomorrow and send home if renal function improving.   Glori Bickers, MD 04/08/2016 10:07 AM  Advanced Heart Failure Team Pager [phone] (M-F; 7a - 4p)  Please contact [facility] for night-coverage after hours (4p -7a ) and weekends on [email or web address]

## 2016-04-09 LAB — BASIC METABOLIC PANEL
ANION GAP: 10 (ref 5–15)
BUN: 38 mg/dL — ABNORMAL HIGH (ref 6–20)
CALCIUM: 9.6 mg/dL (ref 8.9–10.3)
CO2: 29 mmol/L (ref 22–32)
CREATININE: 1.43 mg/dL — AB (ref 0.44–1.00)
Chloride: 96 mmol/L — ABNORMAL LOW (ref 101–111)
GFR calc Af Amer: 52 mL/min — ABNORMAL LOW (ref >60)
GFR, EST NON AFRICAN AMERICAN: 45 mL/min — AB (ref >60)
GLUCOSE: 227 mg/dL — AB (ref 65–99)
Potassium: 4.9 mmol/L (ref 3.5–5.1)
Sodium: 135 mmol/L (ref 135–145)

## 2016-04-09 LAB — CBC
HEMATOCRIT: 36.5 % (ref 36.0–46.0)
Hemoglobin: 11.1 g/dL — ABNORMAL LOW (ref 12.0–15.0)
MCH: 21.3 pg — ABNORMAL LOW (ref 26.0–34.0)
MCHC: 30.4 g/dL (ref 30.0–36.0)
MCV: 70.2 fL — AB (ref 78.0–100.0)
PLATELETS: 312 10*3/uL (ref 150–400)
RBC: 5.2 MIL/uL — ABNORMAL HIGH (ref 3.87–5.11)
RDW: 17.1 % — AB (ref 11.5–15.5)
WBC: 7.8 10*3/uL (ref 4.0–10.5)

## 2016-04-09 LAB — GLUCOSE, CAPILLARY
GLUCOSE-CAPILLARY: 178 mg/dL — AB (ref 65–99)
Glucose-Capillary: 223 mg/dL — ABNORMAL HIGH (ref 65–99)

## 2016-04-09 MED ORDER — CARVEDILOL 12.5 MG PO TABS: 12.5000 mg | ORAL_TABLET | Freq: Two times a day (BID) | ORAL | 0 refills | Status: DC

## 2016-04-09 MED ORDER — TORSEMIDE 20 MG PO TABS: 40.0000 mg | ORAL_TABLET | Freq: Every day | ORAL | 0 refills | Status: DC

## 2016-04-09 MED ORDER — FREESTYLE LANCETS MISC: 12 refills | Status: DC

## 2016-04-09 MED ORDER — POTASSIUM CHLORIDE CRYS ER 20 MEQ PO TBCR: 20.0000 meq | EXTENDED_RELEASE_TABLET | Freq: Every day | ORAL | 0 refills | Status: DC

## 2016-04-09 MED ORDER — SACUBITRIL-VALSARTAN 97-103 MG PO TABS: 1.0000 | ORAL_TABLET | Freq: Two times a day (BID) | ORAL | 0 refills | Status: DC

## 2016-04-09 MED ORDER — ONDANSETRON HCL 4 MG PO TABS: 4.0000 mg | ORAL_TABLET | Freq: Three times a day (TID) | ORAL | 0 refills | Status: DC | PRN

## 2016-04-09 NOTE — Progress Notes (Signed)
Patient ID: Chelsey Santiago, female   DOB: 09-23-1975, 40 y.o.   MRN: [id number]   SUBJECTIVE:   Diuretics held yesterday due to worsening renal function. Renal function improving.  Still complaining of HA. Not getting out of bed much.   Weight up 4 pounds.   Cardiac MRI (11/20): Moderate LVH, diffuse hypokinesis EF 35%, Mildly dilated RV with mildly decreased systolic function, no LGE.   Scheduled Meds: . aspirin EC  81 mg Oral Daily  . carvedilol  12.5 mg Oral BID WC  . escitalopram  20 mg Oral Daily  . heparin  5,000 Units Subcutaneous Q8H  . Influenza vac split quadrivalent PF  0.5 mL Intramuscular Tomorrow-1000  . insulin aspart  0-15 Units Subcutaneous TID WC  . insulin glargine  15 Units Subcutaneous Daily  . isosorbide-hydrALAZINE  1.5 tablet Oral TID  . polyethylene glycol  17 g Oral Daily  . pregabalin  75 mg Oral QHS  . sacubitril-valsartan  1 tablet Oral BID  . sodium chloride flush  3 mL Intravenous Q12H   Continuous Infusions: PRN Meds:.acetaminophen **OR** acetaminophen, alum & mag hydroxide-simeth, diphenhydrAMINE, hydrALAZINE, nitroGLYCERIN, ondansetron **OR** ondansetron (ZOFRAN) IV, oxyCODONE-acetaminophen    Vitals:   04/08/16 0620 04/08/16 1457 04/08/16 2116 04/09/16 0419  BP: (!) 113/55 107/68 (!) 135/98 (!) 138/98  Pulse: 85 78 85 89  Resp: 18 18  20   Temp: 98.2 F (36.8 C) 98 F (36.7 C) 98.5 F (36.9 C) 97.3 F (36.3 C)  TempSrc: Oral Oral Oral Oral  SpO2: 95% 94% 97% 98%  Weight: 106.8 kg (235 lb 6.4 oz)   108.7 kg (239 lb 11.2 oz)  Height:        Intake/Output Summary (Last 24 hours) at 04/09/16 1111 Last data filed at 04/08/16 2344  Gross per 24 hour  Intake              360 ml  Output                0 ml  Net              360 ml    LABS: Basic Metabolic Panel:  Recent Labs  04/08/16 0334 04/09/16 0529  NA 135 135  K 4.8 4.9  CL 91* 96*  CO2 33* 29  GLUCOSE 201* 227*  BUN 33* 38*  CREATININE 1.61* 1.43*  CALCIUM 9.6 9.6    Liver Function Tests: No results for input(s): AST, ALT, ALKPHOS, BILITOT, PROT, ALBUMIN in the last 72 hours. No results for input(s): LIPASE, AMYLASE in the last 72 hours. CBC:  Recent Labs  04/08/16 0334 04/09/16 0529  WBC 6.9 7.8  HGB 12.0 11.1*  HCT 34.8* 36.5  MCV 69.5* 70.2*  PLT 272 312   Cardiac Enzymes:  Recent Labs  04/06/16 1921  TROPONINI <0.03   BNP: Invalid input(s): POCBNP D-Dimer: No results for input(s): DDIMER in the last 72 hours. Hemoglobin A1C: No results for input(s): HGBA1C in the last 72 hours. Fasting Lipid Panel: No results for input(s): CHOL, HDL, LDLCALC, TRIG, CHOLHDL, LDLDIRECT in the last 72 hours. Thyroid Function Tests: No results for input(s): TSH, T4TOTAL, T3FREE, THYROIDAB in the last 72 hours.  Invalid input(s): FREET3 Anemia Panel: No results for input(s): VITAMINB12, FOLATE, FERRITIN, TIBC, IRON, RETICCTPCT in the last 72 hours.  RADIOLOGY: Dg Chest 2 View  Result Date: 04/03/2016 CLINICAL DATA:  Short of breath. CHF. Bilateral foot and ankle swelling. EXAM: CHEST  2 VIEW COMPARISON:  03/29/2016 FINDINGS:  The heart is moderately enlarged. Vascular congestion. There are no Kerley B-lines. Tiny pleural effusions are suspected. No pneumothorax. No pleural effusion. IMPRESSION: Cardiomegaly with vascular congestion and tiny pleural effusions but no interstitial edema. This is compatible with mild volume overload. Electronically Signed   By: Marybelle Killings M.D.   On: 04/03/2016 09:10   Dg Chest 2 View  Result Date: 03/29/2016 CLINICAL DATA:  Leg swelling since Thursday. EXAM: CHEST  2 VIEW COMPARISON:  01/24/2016 FINDINGS: Midline trachea. Moderate cardiomegaly. No pleural effusion or pneumothorax. Mild pulmonary venous congestion without overt congestive failure. No lobar consolidation. IMPRESSION: Cardiomegaly with mild pulmonary venous congestion. Electronically Signed   By: Abigail Miyamoto M.D.   On: 03/29/2016 11:42   Ct Chest Wo  Contrast  Result Date: 04/03/2016 CLINICAL DATA:  Shortness of breath and coughing for 1 week. Lower extremity edema. Abnormal chest radiograph. EXAM: CT CHEST WITHOUT CONTRAST TECHNIQUE: Multidetector CT imaging of the chest was performed following the standard protocol without IV contrast. COMPARISON:  Chest radiograph on 04/03/2016 FINDINGS: Cardiovascular: Mild to moderate cardiomegaly. No evidence of pericardial effusion. Mediastinum/Nodes: No masses or pathologically enlarged lymph nodes identified on this unenhanced exam. Lungs/Pleura: Mild heterogeneous ground-glass opacity is seen in both lungs with mild patchy airspace disease in both lower lobes. This appears symmetric. Small right pleural effusion also noted. No evidence of pulmonary mass. Upper Abdomen:  Unremarkable. Musculoskeletal: No suspicious bone lesions or other significant abnormality. IMPRESSION: Cardiomegaly and small right pleural effusion. Symmetric mild pulmonary ground-glass opacity and patchy airspace disease in both lower lobes, suspicious for pulmonary edema with pneumonia considered less likely. No mass or lymphadenopathy identified. Electronically Signed   By: Earle Gell M.D.   On: 04/03/2016 10:24   Mr Cardiac Morphology W Wo Contrast  Result Date: 04/06/2016 CLINICAL DATA:  Cardiomyopathy of uncertain etiology EXAM: CARDIAC MRI TECHNIQUE: The patient was scanned on a 1.5 Tesla GE magnet. A dedicated cardiac coil was used. Functional imaging was done using Fiesta sequences. 2,3, and 4 chamber views were done to assess for RWMA's. Modified Simpson's rule using a short axis stack was used to calculate an ejection fraction on a dedicated work Conservation officer, nature. The patient received 35 cc of Multihance. After 10 minutes inversion recovery sequences were used to assess for infiltration and scar tissue. CONTRAST:  35 cc Multihance FINDINGS: The patient was claustrophobic and was given Valium for sedation. Difficult  images due to poor breath-holding. Limited images of the lung fields showed no gross abnormalities. Normal left ventricular size with moderate LV hypertrophy. EF 35% with diffuse hypokinesis. Mildly dilated right ventricle with mildly decreased systolic function. Mild biatrial enlargement. There was trivial mitral regurgitation, mild to moderate tricuspid regurgitation. Trileaflet aortic valve with no significant stenosis or regurgitation. On delayed enhancement imaging, there was no myocardial late gadolinium enhancement (LGE). MEASUREMENTS: MEASUREMENTS LVEDV 197 mL LV SV 69 mL LV EF 35% IMPRESSION: 1.  Technically difficult study. 2.  Normal LV size with moderate LV hypertrophy, EF 35%. 3.  Mildly dilated RV with mildly decreased RV systolic function. 4. No myocardial LGE, so no definitive evidence for prior myocardial infarction, infiltrative disease, or myocarditis. Dalton Mclean Electronically Signed   By: Loralie Champagne M.D.   On: 04/06/2016 07:38   Vitals:   04/08/16 0620 04/08/16 1457 04/08/16 2116 04/09/16 0419  BP: (!) 113/55 107/68 (!) 135/98 (!) 138/98  Pulse: 85 78 85 89  Resp: 18 18  20   Temp: 98.2 F (36.8 C) 98  F (36.7 C) 98.5 F (36.9 C) 97.3 F (36.3 C)  TempSrc: Oral Oral Oral Oral  SpO2: 95% 94% 97% 98%  Weight: 106.8 kg (235 lb 6.4 oz)   108.7 kg (239 lb 11.2 oz)  Height:         PHYSICAL EXAM General: NAD. Lying in bed Neck: JVP hard to see. no thyromegaly or thyroid nodule.  Lungs: Mildly diminished basilar sounds.  CV: Nondisplaced PMI.  Heart regular S1/S2, no S3/S4, no murmur. No edema.  No carotid bruit.  Normal pedal pulses.  Abdomen: Soft, NT, ND, no HSM. No bruits or masses. +BS  Neurologic: Alert and oriented x 3.  Psych: Flat affect. Extremities: No clubbing or cyanosis.   TELEMETRY: Reviewed, NSR 80s   ASSESSMENT AND PLAN: 40 yo with history of HTN, DM2, OSA, and nonischemic cardiomyopathy was admitted with acute on chronic systolic CHF in setting of  hypertensive emergency/pulmonary edema.  1.  HTN: Admitted with hypertensive emergency and pulmonary edema.  - BP improved. Given HAs with Bidil we have cut back to 1 tab tid  2. Acute on chronic systolic CHF: Last echo with EF 40-45% (8/17).  Cardiac MRI this admission with moderate LVH, no late gadolinium enhancement, EF 35% with mildly decreased RV systolic function.  Cause of cardiomyopathy is uncertain => nonischemic, could be hypertensive CMP versus familial CMP (mother, GM with CMP of uncertain etiology) versus post-partum (initial episode after 2009 delivery, then again 2015).  She now has had a tubal ligation.  She was admitted with hypertensive emergency/volume overload.  - Volume status ok. Start torsemide 40 daily - Continue current Entresto, spironolactone, and Coreg at current doses.  - Cut Bidil back due to HA 3. Acute respiratory failure -improving with diuresis 4. Morbid obesity 5. AKI - likely due to overdiuresis. Resolving  She seems to have very little motivation to take care of herself. Continually requesting pain meds for HA ? Secondary gain issues  Can go home today from HF standpoint on these meds  Entresto 97/103 bid (increased dose) Carvedilol 12.5 bid Torsemide 40 daily (replaces lasix) Spiro 25 daily Bidil 1 tab tid   F/u HF clinic 1-2 weeks.  Glori Bickers, MD 04/09/2016 11:11 AM  Advanced Heart Failure Team Pager [phone] (M-F; 7a - 4p)  Please contact [facility] for night-coverage after hours (4p -7a ) and weekends on [email or web address]

## 2016-04-09 NOTE — Progress Notes (Signed)
Inpatient Diabetes Program Recommendations  AACE/ADA: New Consensus Statement on Inpatient Glycemic Control (2015)  Target Ranges:  Prepandial:   less than 140 mg/dL      Peak postprandial:   less than 180 mg/dL (1-2 hours)      Critically ill patients:  140 - 180 mg/dL   Lab Results  Component Value Date   GLUCAP 223 (H) 04/09/2016   HGBA1C 10.3 (H) 04/03/2016    Review of Glycemic Control:  Results for AMARIEA, ZICH (MRN [id number]) as of 04/09/2016 08:51  Ref. Range 04/07/2016 11:43 04/07/2016 16:41 04/07/2016 21:15 04/08/2016 07:30 04/08/2016 11:10 04/08/2016 16:32 04/08/2016 21:18 04/09/2016 07:39  Glucose-Capillary Latest Ref Range: 65 - 99 mg/dL 225 (H) 255 (H) 201 (H) 206 (H) 211 (H) 191 (H) 231 (H) 223 (H)   Diabetes history: Type 2 diabetes Outpatient Diabetes medications: Januvia 100 mg daily, Metformin 500 mg bid Current orders for Inpatient glycemic control:  Lantus 15 units daily, Novolog moderate tid with meals  Inpatient Diabetes Program Recommendations:   Note that CBG's continue to be elevated.  Please consider increasing Lantus to 22 units daily.  Also consider Novolog meal coverage 4 units tid with meals. May need insulin at home based on A1C?  Thanks, Adah Perl, RN, BC-ADM Inpatient Diabetes Coordinator Pager [phone] (8a-5p)

## 2016-04-09 NOTE — Discharge Summary (Addendum)
Physician Discharge Summary  Chelsey Santiago [id number] DOB: [birth year] DOA: 04/03/2016  PCP: Rosita Fire, MD  Admit date: 04/03/2016 Discharge date: 04/09/2016  Admitted From: Home  Disposition:  Home  Recommendations for Outpatient Follow-up:  1. Follow up with PCP in 1- weeks 2. Please obtain BMP in one week 3. Delene Loll has been increased in dose 4. Holding amlodipine to prevent hypotension 5. Changed furosemide to torsemide for diuresis  Home Health: No  Equipment/Devices: No   Discharge Condition: Stable   CODE STATUS: Full  Diet recommendation: Heart Healthy / Carb Modified   Brief/Interim Summary: This is a 39 year old female who presented to hospital chief complaint of dyspnea. For last 5 days prior to hospitalization she experienced dyspnea on exertion, orthopnea, PND and lower extremity edema. On initial physical examination blood pressure was 195/135, pulse rate 109, respiratory rate 20, temperature 97.5, oximetry 100% on supplemental oxygen. Her mucous membranes were moist, his lungs had no wheezing no rales, heart S1-S2 present rhythmic, positive lower extremity edema. Sodium 135, potassium 3.8, BUN 18, creatinine 0.95, glucose 300, BNP 2047, troponin less than 0.03. White count 10.2, hemoglobin 9.6, hematocrit 32.2, platelets 285. Her EKG was sinus rhythm, positive LVH. Her chest film had cardiomegaly with increased vascular congestion.  Patient was admitted to hospital working diagnosis of acute on chronic decompensated heart failure, systolic dysfunction complicated by hypertensive emergency and cardiogenic pulmonary edema.    1. Decompensated heart failure acute on chronic systolic dysfunction. Patient was admitted to the telemetry unit, patient was diuresed with intravenous furosemide. Patient was continue with afterload reducing agents including Bidil and Entresto. B blockade with Coreg. Negative fluid balance was achieved -7356 mL. Clinically patient improved  significantly her symptoms. She'll be discharged with increased dose of Entresto. Cardiac MRI showed moderate LVH with ejection fraction 35%. Cardiology was consulted during this hospitalization.   2. Hypertensive emergency. Patient has responded well to diuresis and antihypertensive agents. Her discharge blood pressure is [id number] to [id number] systolic.  3. Cardiogenic pulmonary edema. Patient responded well to diuresis. CT of the chest was performed suggestive pulmonary edema. Pulse oximetry by the time of discharge is 98% room air.  4. Acute kidney injury. Patient developed acute kidney injury probably related to overdiuresis. Peak creatinine 1.61 by the time discharge down to 1.43. Patient will be discharged on torsemide daily, potassium supplements have been decreased to 20 meq daily and will continue Aldactone. Recommend to follow-up kidney function as an outpatient within 7 days.  5. Type 2 diabetes mellitus. During her hospitalization patient was treated with insulin sliding-scale as well as glargine 15 units. Her capillary glucose range between 178 and 231 over last 24 hours. By the time of discharge patient will resume metformin and Januvia. Close monitoring of the kidney function.  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 (HCC) Active Problems:   Acute pulmonary edema (HCC)   Type 2 diabetes mellitus without complication, without long-term current use of insulin ([place])   Cardiomyopathy due to hypertension [facility])   Hypertensive emergency   Nonischemic cardiomyopathy (HCC)   AKI (acute kidney injury) ([place])   Hyperkalemia    Discharge Instructions  Discharge Instructions    Amb Referral to Cardiac Rehabilitation    Complete by:  As directed    Diagnosis:  Heart Failure (see criteria below if ordering Phase II)   Heart Failure Type:  Chronic Systolic   Ambulatory referral to Nutrition and Diabetic  Education    Complete by:  As directed    Patient lives in [place], and would like to go to Dr. Liliane Channel office.   Diet - low sodium heart healthy    Complete by:  As directed    Discharge instructions    Complete by:  As directed    Please follow with primary care in 7 days.   Increase activity slowly    Complete by:  As directed        Medication List    STOP taking these medications   amLODipine 5 MG tablet Commonly known as:  NORVASC   furosemide 40 MG tablet Commonly known as:  LASIX   ondansetron 8 MG disintegrating tablet Commonly known as:  ZOFRAN-ODT   sacubitril-valsartan 49-51 MG Commonly known as:  ENTRESTO Replaced by:  sacubitril-valsartan 97-103 MG     TAKE these medications   acetaminophen 500 MG tablet Commonly known as:  TYLENOL Take 1,000 mg by mouth every 6 (six) hours as needed.   aspirin EC 81 MG tablet Take 81 mg by mouth daily.   carvedilol 12.5 MG tablet Commonly known as:  COREG Take 1 tablet (12.5 mg total) by mouth 2 (two) times daily with a meal. What changed:  See the new instructions.   escitalopram 20 MG tablet Commonly known as:  LEXAPRO Take 1 tablet (20 mg total) by mouth daily.   freestyle lancets Use as instructed   isosorbide-hydrALAZINE 20-37.5 MG tablet Commonly known as:  BIDIL Take 1 tablet by mouth 3 (three) times daily.   metFORMIN 500 MG tablet Commonly known as:  GLUCOPHAGE Take 1 tablet (500 mg total) by mouth 2 (two) times daily with a meal.   nitroGLYCERIN 0.4 MG SL tablet Commonly known as:  NITROSTAT Place 1 tablet (0.4 mg total) under the tongue every 5 (five) minutes as needed for chest pain.   ondansetron 4 MG tablet Commonly known as:  ZOFRAN Take 1 tablet (4 mg total) by mouth every 8 (eight) hours as needed for nausea.   potassium chloride SA 20 MEQ tablet Commonly known as:  K-DUR,KLOR-CON Take 1 tablet (20 mEq total) by mouth daily. What changed:  how much to take   pregabalin 75 MG  capsule Commonly known as:  LYRICA Take 75 mg by mouth daily.   promethazine 12.5 MG tablet Commonly known as:  PHENERGAN Take 1 tablet (12.5 mg total) by mouth every 6 (six) hours as needed for nausea or vomiting.   sacubitril-valsartan 97-103 MG Commonly known as:  ENTRESTO Take 1 tablet by mouth 2 (two) times daily. Replaces:  sacubitril-valsartan 49-51 MG   sitaGLIPtin 100 MG tablet Commonly known as:  JANUVIA Take 100 mg by mouth daily.   spironolactone 25 MG tablet Commonly known as:  ALDACTONE Take 1 tablet (25 mg total) by mouth daily.   torsemide 20 MG tablet Commonly known as:  DEMADEX Take 2 tablets (40 mg total) by mouth daily.            Durable Medical Equipment        Start     Ordered   04/09/16 0000  DME Other see comment    Comments:  glucometer   04/09/16 1556     Follow-up Information     [facility] Follow up on 04/19/2016.   Specialty:  Cardiology Why:  at 0940 for post hospital follow up. Please bring all of your medications to your visit. The code for parking  is 0040. Enter off of [address] into [address] area.  Underground parking garage will be on your right. Contact information: [id number] [place]       FANTA,TESFAYE, MD Follow up in 1 week(s).   Specialty:  Internal Medicine Contact information: [place] [address] [phone]          Allergies  Allergen Reactions  . Diclofenac Swelling    AND POSSIBLE SYNCOPE  . Tramadol Nausea And Vomiting  . Vicodin [Hydrocodone-Acetaminophen] Itching and Nausea Only    Consultations:  Cardiology heart failure team   Procedures/Studies: Dg Chest 2 View  Result Date: 04/03/2016 CLINICAL DATA:  Short of breath. CHF. Bilateral foot and ankle swelling. EXAM: CHEST  2 VIEW COMPARISON:  03/29/2016 FINDINGS: The heart is moderately enlarged.  Vascular congestion. There are no Kerley B-lines. Tiny pleural effusions are suspected. No pneumothorax. No pleural effusion. IMPRESSION: Cardiomegaly with vascular congestion and tiny pleural effusions but no interstitial edema. This is compatible with mild volume overload. Electronically Signed   By: Marybelle Killings M.D.   On: 04/03/2016 09:10   Dg Chest 2 View  Result Date: 03/29/2016 CLINICAL DATA:  Leg swelling since Thursday. EXAM: CHEST  2 VIEW COMPARISON:  01/24/2016 FINDINGS: Midline trachea. Moderate cardiomegaly. No pleural effusion or pneumothorax. Mild pulmonary venous congestion without overt congestive failure. No lobar consolidation. IMPRESSION: Cardiomegaly with mild pulmonary venous congestion. Electronically Signed   By: Abigail Miyamoto M.D.   On: 03/29/2016 11:42   Ct Chest Wo Contrast  Result Date: 04/03/2016 CLINICAL DATA:  Shortness of breath and coughing for 1 week. Lower extremity edema. Abnormal chest radiograph. EXAM: CT CHEST WITHOUT CONTRAST TECHNIQUE: Multidetector CT imaging of the chest was performed following the standard protocol without IV contrast. COMPARISON:  Chest radiograph on 04/03/2016 FINDINGS: Cardiovascular: Mild to moderate cardiomegaly. No evidence of pericardial effusion. Mediastinum/Nodes: No masses or pathologically enlarged lymph nodes identified on this unenhanced exam. Lungs/Pleura: Mild heterogeneous ground-glass opacity is seen in both lungs with mild patchy airspace disease in both lower lobes. This appears symmetric. Small right pleural effusion also noted. No evidence of pulmonary mass. Upper Abdomen:  Unremarkable. Musculoskeletal: No suspicious bone lesions or other significant abnormality. IMPRESSION: Cardiomegaly and small right pleural effusion. Symmetric mild pulmonary ground-glass opacity and patchy airspace disease in both lower lobes, suspicious for pulmonary edema with pneumonia considered less likely. No mass or lymphadenopathy identified.  Electronically Signed   By: Earle Gell M.D.   On: 04/03/2016 10:24   Mr Cardiac Morphology W Wo Contrast  Result Date: 04/06/2016 CLINICAL DATA:  Cardiomyopathy of uncertain etiology EXAM: CARDIAC MRI TECHNIQUE: The patient was scanned on a 1.5 Tesla GE magnet. A dedicated cardiac coil was used. Functional imaging was done using Fiesta sequences. 2,3, and 4 chamber views were done to assess for RWMA's. Modified Simpson's rule using a short axis stack was used to calculate an ejection fraction on a dedicated work Conservation officer, nature. The patient received 35 cc of Multihance. After 10 minutes inversion recovery sequences were used to assess for infiltration and scar tissue. CONTRAST:  35 cc Multihance FINDINGS: The patient was claustrophobic and was given Valium for sedation. Difficult images due to poor breath-holding. Limited images of the lung fields showed no gross abnormalities. Normal left ventricular size with moderate LV hypertrophy. EF 35% with diffuse hypokinesis. Mildly dilated right ventricle with mildly decreased systolic function. Mild biatrial enlargement. There was trivial mitral regurgitation, mild  to moderate tricuspid regurgitation. Trileaflet aortic valve with no significant stenosis or regurgitation. On delayed enhancement imaging, there was no myocardial late gadolinium enhancement (LGE). MEASUREMENTS: MEASUREMENTS LVEDV 197 mL LV SV 69 mL LV EF 35% IMPRESSION: 1.  Technically difficult study. 2.  Normal LV size with moderate LV hypertrophy, EF 35%. 3.  Mildly dilated RV with mildly decreased RV systolic function. 4. No myocardial LGE, so no definitive evidence for prior myocardial infarction, infiltrative disease, or myocarditis. Dalton Mclean Electronically Signed   By: Loralie Champagne M.D.   On: 04/06/2016 07:38      Subjective: Patient feeling better, no chest pain, dyspnea has improved. Positive headache, moderate in intensity radiated to the neck.   Discharge  Exam: Vitals:   04/09/16 0419 04/09/16 0820  BP: (!) 138/98 139/84  Pulse: 89   Resp: 20   Temp: 97.3 F (36.3 C)    Vitals:   04/08/16 1457 04/08/16 2116 04/09/16 0419 04/09/16 0820  BP: 107/68 (!) 135/98 (!) 138/98 139/84  Pulse: 78 85 89   Resp: 18  20   Temp: 98 F (36.7 C) 98.5 F (36.9 C) 97.3 F (36.3 C)   TempSrc: Oral Oral Oral   SpO2: 94% 97% 98%   Weight:   108.7 kg (239 lb 11.2 oz)   Height:        General: Pt is alert, awake, not in acute distress Cardiovascular: RRR, S1/S2 +, no rubs, no gallops Respiratory: CTA bilaterally, no wheezing, no rhonchi Abdominal: Soft, NT, ND, bowel sounds + Extremities: no edema, no cyanosis    The results of significant diagnostics from this hospitalization (including imaging, microbiology, ancillary and laboratory) are listed below for reference.     Microbiology: Recent Results (from the past 240 hour(s))  MRSA PCR Screening     Status: Abnormal   Collection Time: 04/03/16  8:22 PM  Result Value Ref Range Status   MRSA by PCR POSITIVE (A) NEGATIVE Final    Comment:        The GeneXpert MRSA Assay (FDA approved for NASAL specimens only), is one component of a comprehensive MRSA colonization surveillance program. It is not intended to diagnose MRSA infection nor to guide or monitor treatment for MRSA infections. RESULT CALLED TO, READ BACK BY AND VERIFIED WITH:  TO BAVERY(RN) BY TC[place]AND 04/04/16 AT 1:14AM      Labs: BNP (last 3 results)  Recent Labs  04/03/16 0824 04/03/16 1512 04/08/16 0334  BNP 2,047.0* 2,138.8* [id number]   Basic Metabolic Panel:  Recent Labs Lab 04/05/16 0208 04/05/16 0735 04/06/16 0435 04/07/16 0748 04/08/16 0334 04/09/16 0529  NA 136  --  137 135 135 135  K 3.7  --  4.6 3.8 4.8 4.9  CL 96*  --  99* 94* 91* 96*  CO2 30  --  30 31 33* 29  GLUCOSE 153*  --  146* 203* 201* 227*  BUN 18  --  18 19 33* 38*  CREATININE 1.06*  --  0.96 1.01* 1.61* 1.43*  CALCIUM 8.4*  --  8.8*  9.4 9.6 9.6  MG  --  1.8  --   --   --   --    Liver Function Tests:  Recent Labs Lab 04/03/16 0823  AST 56*  ALT 57*  ALKPHOS 78  BILITOT 0.4  PROT 6.6  ALBUMIN 2.9*   No results for input(s): LIPASE, AMYLASE in the last 168 hours. No results for input(s): AMMONIA in the last 168 hours. CBC:  Recent Labs Lab 04/03/16 0823  04/05/16 0208 04/06/16 0435 04/07/16 0748 04/08/16 0334 04/09/16 0529  WBC 10.2  < > 10.0 9.0 8.3 6.9 7.8  NEUTROABS 7.2  --   --   --   --   --   --   HGB 9.6*  < > 10.3* 10.4* 10.9* 12.0 11.1*  HCT 32.2*  < > 34.7* 35.0* 35.5* 34.8* 36.5  MCV 70.8*  < > 70.2* 70.7* 69.1* 69.5* 70.2*  PLT 285  < > 351 286 306 272 312  < > = values in this interval not displayed. Cardiac Enzymes:  Recent Labs Lab 04/03/16 0823 04/06/16 1921  TROPONINI <0.03 <0.03   BNP: Invalid input(s): POCBNP CBG:  Recent Labs Lab 04/08/16 1110 04/08/16 1632 04/08/16 2118 04/09/16 0739 04/09/16 1135  GLUCAP 211* 191* 231* 223* 178*   D-Dimer No results for input(s): DDIMER in the last 72 hours. Hgb A1c No results for input(s): HGBA1C in the last 72 hours. Lipid Profile No results for input(s): CHOL, HDL, LDLCALC, TRIG, CHOLHDL, LDLDIRECT in the last 72 hours. Thyroid function studies No results for input(s): TSH, T4TOTAL, T3FREE, THYROIDAB in the last 72 hours.  Invalid input(s): FREET3 Anemia work up No results for input(s): VITAMINB12, FOLATE, FERRITIN, TIBC, IRON, RETICCTPCT in the last 72 hours. Urinalysis    Component Value Date/Time   COLORURINE YELLOW 12/08/2015 0045   APPEARANCEUR CLOUDY (A) 12/08/2015 0045   LABSPEC 1.029 12/08/2015 0045   PHURINE 5.5 12/08/2015 0045   GLUCOSEU 100 (A) 12/08/2015 0045   HGBUR NEGATIVE 12/08/2015 0045   BILIRUBINUR NEGATIVE 12/08/2015 0045   KETONESUR NEGATIVE 12/08/2015 0045   PROTEINUR 100 (A) 12/08/2015 0045   UROBILINOGEN 0.2 04/29/2014 1115   NITRITE NEGATIVE 12/08/2015 0045   LEUKOCYTESUR NEGATIVE  12/08/2015 0045   Sepsis Labs Invalid input(s): PROCALCITONIN,  WBC,  LACTICIDVEN Microbiology Recent Results (from the past 240 hour(s))  MRSA PCR Screening     Status: Abnormal   Collection Time: 04/03/16  8:22 PM  Result Value Ref Range Status   MRSA by PCR POSITIVE (A) NEGATIVE Final    Comment:        The GeneXpert MRSA Assay (FDA approved for NASAL specimens only), is one component of a comprehensive MRSA colonization surveillance program. It is not intended to diagnose MRSA infection nor to guide or monitor treatment for MRSA infections. RESULT CALLED TO, READ BACK BY AND VERIFIED WITH:  TO BAVERY(RN) BY [facility] 04/04/16 AT 1:14AM      Time coordinating discharge: 45 minutes  SIGNED:   Tawni Millers, MD  [facility] 04/09/2016, 2:05 PM Pager   If 7PM-7AM, please contact night-coverage [email or web address] Password [email or web address]

## 2016-04-12 ENCOUNTER — Telehealth (HOSPITAL_COMMUNITY): Payer: Self-pay | Admitting: Surgery

## 2016-04-12 NOTE — Telephone Encounter (Signed)
Heart Failure Nurse Navigator Post Discharge Telephone Call  I called Chelsey Santiago to check on her after her recent hospitalization.  She tells me that she has been doing "fairly well".  She does complain of ongoing headaches.  She also says that nothing was "left at the pharmacy" for her headaches.  We discussed that the headaches may be secondary to one of the medications that she was started on and should be better after a short while.  I asked when her follow-up appt was and she told me that she needed me to call her back later.  I will attempt to reinforce education at a later time per her request.

## 2016-04-16 ENCOUNTER — Ambulatory Visit (INDEPENDENT_AMBULATORY_CARE_PROVIDER_SITE_OTHER): Payer: Medicaid Other | Admitting: Adult Health

## 2016-04-16 ENCOUNTER — Encounter: Payer: Self-pay | Admitting: Adult Health

## 2016-04-16 VITALS — BP 168/110 | HR 86 | Ht 66.0 in | Wt 244.0 lb

## 2016-04-16 DIAGNOSIS — F329 Major depressive disorder, single episode, unspecified: Secondary | ICD-10-CM | POA: Diagnosis not present

## 2016-04-16 DIAGNOSIS — Z8679 Personal history of other diseases of the circulatory system: Secondary | ICD-10-CM | POA: Diagnosis not present

## 2016-04-16 DIAGNOSIS — G44229 Chronic tension-type headache, not intractable: Secondary | ICD-10-CM

## 2016-04-16 DIAGNOSIS — I10 Essential (primary) hypertension: Secondary | ICD-10-CM

## 2016-04-16 DIAGNOSIS — F32A Depression, unspecified: Secondary | ICD-10-CM | POA: Insufficient documentation

## 2016-04-16 NOTE — Patient Instructions (Signed)
Continue lexapro Call Dr Arvil Persons for appt ASAP  Follow up in 4 weeks

## 2016-04-16 NOTE — Progress Notes (Signed)
Subjective:     Patient ID: Chelsey Santiago, female   DOB: 18-Sep-1975, 40 y.o.   MRN: [id number]  HPI Chelsey Santiago is a 40 year old black female, back in follow up of depression and is feeling much better.Has been in hospital for CHF.Has daily headache even when in hospital.Has appt with Dr Legrand Rams 12/26. She is taking her meds.  Review of Systems +headache Reviewed past medical,surgical, social and family history. Reviewed medications and allergies.     Objective:   Physical Exam BP (!) 168/110 (BP Location: Right Arm, Patient Position: Sitting, Cuff Size: Large)   Pulse 86   Ht 5\' 6"  (1.676 m)   Wt 244 lb (110.7 kg)   LMP 04/03/2016 (Exact Date)   BMI 39.38 kg/m    Skin warm and dry. Lungs: clear to ausculation bilaterally. Cardiovascular: regular rate and rhythm.No pitting edema. PHQ 9 score 15, which is much better was 24 11/10.Smiling and laughing today. Needs to call Dr Arvil Persons for appt, and check weight and BP daily.She will call them now, when gets in car.Discussed with Dr Elonda Husky.  Assessment:     1. Depression, unspecified depression type   2. Hypertension, unspecified type   3. Chronic tension-type headache, not intractable   4.       History of CHF    Plan:      Continue lexapro Call Dr Arvil Persons for appt ASAP about BP Follow up in 4 weeks

## 2016-04-19 ENCOUNTER — Encounter (HOSPITAL_COMMUNITY): Payer: Medicaid Other

## 2016-05-06 ENCOUNTER — Encounter (HOSPITAL_COMMUNITY): Payer: Self-pay | Admitting: Emergency Medicine

## 2016-05-06 ENCOUNTER — Inpatient Hospital Stay (HOSPITAL_COMMUNITY)
Admission: EM | Admit: 2016-05-06 | Discharge: 2016-05-10 | DRG: 291 | Disposition: A | Discharge status: Home / Self Care | Payer: Medicaid Other | Attending: Internal Medicine | Admitting: Internal Medicine

## 2016-05-06 ENCOUNTER — Emergency Department (HOSPITAL_COMMUNITY): Payer: Medicaid Other

## 2016-05-06 DIAGNOSIS — E1140 Type 2 diabetes mellitus with diabetic neuropathy, unspecified: Secondary | ICD-10-CM | POA: Diagnosis present

## 2016-05-06 DIAGNOSIS — F329 Major depressive disorder, single episode, unspecified: Secondary | ICD-10-CM | POA: Diagnosis present

## 2016-05-06 DIAGNOSIS — E118 Type 2 diabetes mellitus with unspecified complications: Secondary | ICD-10-CM | POA: Diagnosis not present

## 2016-05-06 DIAGNOSIS — I5043 Acute on chronic combined systolic (congestive) and diastolic (congestive) heart failure: Secondary | ICD-10-CM | POA: Diagnosis present

## 2016-05-06 DIAGNOSIS — D72829 Elevated white blood cell count, unspecified: Secondary | ICD-10-CM

## 2016-05-06 DIAGNOSIS — I509 Heart failure, unspecified: Secondary | ICD-10-CM | POA: Insufficient documentation

## 2016-05-06 DIAGNOSIS — I130 Hypertensive heart and chronic kidney disease with heart failure and stage 1 through stage 4 chronic kidney disease, or unspecified chronic kidney disease: Principal | ICD-10-CM | POA: Diagnosis present

## 2016-05-06 DIAGNOSIS — Z6837 Body mass index (BMI) 37.0-37.9, adult: Secondary | ICD-10-CM | POA: Diagnosis not present

## 2016-05-06 DIAGNOSIS — E669 Obesity, unspecified: Secondary | ICD-10-CM | POA: Diagnosis present

## 2016-05-06 DIAGNOSIS — E876 Hypokalemia: Secondary | ICD-10-CM | POA: Diagnosis present

## 2016-05-06 DIAGNOSIS — G629 Polyneuropathy, unspecified: Secondary | ICD-10-CM

## 2016-05-06 DIAGNOSIS — Z833 Family history of diabetes mellitus: Secondary | ICD-10-CM

## 2016-05-06 DIAGNOSIS — E66812 Obesity, class 2: Secondary | ICD-10-CM | POA: Diagnosis present

## 2016-05-06 DIAGNOSIS — Z8249 Family history of ischemic heart disease and other diseases of the circulatory system: Secondary | ICD-10-CM | POA: Diagnosis not present

## 2016-05-06 DIAGNOSIS — F32A Depression, unspecified: Secondary | ICD-10-CM | POA: Diagnosis present

## 2016-05-06 DIAGNOSIS — I10 Essential (primary) hypertension: Secondary | ICD-10-CM

## 2016-05-06 DIAGNOSIS — N189 Chronic kidney disease, unspecified: Secondary | ICD-10-CM | POA: Diagnosis present

## 2016-05-06 DIAGNOSIS — Z7984 Long term (current) use of oral hypoglycemic drugs: Secondary | ICD-10-CM

## 2016-05-06 DIAGNOSIS — I1A0 Resistant hypertension: Secondary | ICD-10-CM | POA: Diagnosis present

## 2016-05-06 DIAGNOSIS — E1122 Type 2 diabetes mellitus with diabetic chronic kidney disease: Secondary | ICD-10-CM | POA: Diagnosis present

## 2016-05-06 DIAGNOSIS — Z79899 Other long term (current) drug therapy: Secondary | ICD-10-CM | POA: Diagnosis not present

## 2016-05-06 DIAGNOSIS — R51 Headache: Secondary | ICD-10-CM | POA: Diagnosis present

## 2016-05-06 DIAGNOSIS — Z7982 Long term (current) use of aspirin: Secondary | ICD-10-CM

## 2016-05-06 LAB — BASIC METABOLIC PANEL
ANION GAP: 10 (ref 5–15)
BUN: 12 mg/dL (ref 6–20)
CO2: 28 mmol/L (ref 22–32)
Calcium: 9.1 mg/dL (ref 8.9–10.3)
Chloride: 100 mmol/L — ABNORMAL LOW (ref 101–111)
Creatinine, Ser: 1.18 mg/dL — ABNORMAL HIGH (ref 0.44–1.00)
GFR calc Af Amer: >60 mL/min (ref >60)
GFR, EST NON AFRICAN AMERICAN: 57 mL/min — AB (ref >60)
GLUCOSE: 173 mg/dL — AB (ref 65–99)
POTASSIUM: 4 mmol/L (ref 3.5–5.1)
Sodium: 138 mmol/L (ref 135–145)

## 2016-05-06 LAB — CBC
HEMATOCRIT: 34.8 % — AB (ref 36.0–46.0)
HEMOGLOBIN: 10.7 g/dL — AB (ref 12.0–15.0)
MCH: 21.7 pg — ABNORMAL LOW (ref 26.0–34.0)
MCHC: 30.7 g/dL (ref 30.0–36.0)
MCV: 70.6 fL — ABNORMAL LOW (ref 78.0–100.0)
Platelets: 268 10*3/uL (ref 150–400)
RBC: 4.93 MIL/uL (ref 3.87–5.11)
RDW: 19.2 % — ABNORMAL HIGH (ref 11.5–15.5)
WBC: 15.2 10*3/uL — ABNORMAL HIGH (ref 4.0–10.5)

## 2016-05-06 LAB — I-STAT TROPONIN, ED: Troponin i, poc: 0 ng/mL (ref 0.00–0.08)

## 2016-05-06 LAB — GLUCOSE, CAPILLARY
GLUCOSE-CAPILLARY: 145 mg/dL — AB (ref 65–99)
GLUCOSE-CAPILLARY: 179 mg/dL — AB (ref 65–99)

## 2016-05-06 LAB — TROPONIN I
Troponin I: 0.03 ng/mL (ref <0.03)
Troponin I: 0.03 ng/mL (ref <0.03)
Troponin I: <0.03 ng/mL (ref <0.03)

## 2016-05-06 LAB — CBG MONITORING, ED: GLUCOSE-CAPILLARY: 184 mg/dL — AB (ref 65–99)

## 2016-05-06 LAB — BRAIN NATRIURETIC PEPTIDE: B Natriuretic Peptide: 2932.4 pg/mL — ABNORMAL HIGH (ref 0.0–100.0)

## 2016-05-06 MED ORDER — HYDRALAZINE HCL 20 MG/ML IJ SOLN
5.0000 mg | INTRAMUSCULAR | Status: DC | PRN
  Administered 2016-05-06: 5 mg via INTRAVENOUS
  Filled 2016-05-06: qty 1

## 2016-05-06 MED ORDER — OXYCODONE HCL 5 MG PO TABS
5.0000 mg | ORAL_TABLET | Freq: Four times a day (QID) | ORAL | Status: AC | PRN
Start: 2016-05-06 — End: 2016-05-07
  Administered 2016-05-06 – 2016-05-07 (×2): 5 mg via ORAL
  Filled 2016-05-06 (×2): qty 1

## 2016-05-06 MED ORDER — ASPIRIN EC 81 MG PO TBEC
81.0000 mg | DELAYED_RELEASE_TABLET | Freq: Every day | ORAL | Status: DC
  Administered 2016-05-06 – 2016-05-10 (×5): 81 mg via ORAL
  Filled 2016-05-06 (×5): qty 1

## 2016-05-06 MED ORDER — INSULIN ASPART 100 UNIT/ML ~~LOC~~ SOLN
0.0000 [IU] | Freq: Three times a day (TID) | SUBCUTANEOUS | Status: DC
  Administered 2016-05-06: 2 [IU] via SUBCUTANEOUS
  Administered 2016-05-06: 1 [IU] via SUBCUTANEOUS
  Filled 2016-05-06: qty 1

## 2016-05-06 MED ORDER — POTASSIUM CHLORIDE CRYS ER 20 MEQ PO TBCR
20.0000 meq | EXTENDED_RELEASE_TABLET | Freq: Every day | ORAL | Status: DC
  Administered 2016-05-06 – 2016-05-10 (×5): 20 meq via ORAL
  Filled 2016-05-06 (×5): qty 1

## 2016-05-06 MED ORDER — TRAMADOL HCL 50 MG PO TABS
50.0000 mg | ORAL_TABLET | Freq: Four times a day (QID) | ORAL | Status: DC | PRN
  Administered 2016-05-06: 50 mg via ORAL
  Filled 2016-05-06 (×2): qty 1

## 2016-05-06 MED ORDER — LINAGLIPTIN 5 MG PO TABS
5.0000 mg | ORAL_TABLET | Freq: Every day | ORAL | Status: DC
  Filled 2016-05-06: qty 1

## 2016-05-06 MED ORDER — ACETAMINOPHEN 325 MG PO TABS
650.0000 mg | ORAL_TABLET | Freq: Four times a day (QID) | ORAL | Status: DC | PRN
  Administered 2016-05-06 – 2016-05-08 (×5): 650 mg via ORAL
  Filled 2016-05-06 (×5): qty 2

## 2016-05-06 MED ORDER — SACUBITRIL-VALSARTAN 97-103 MG PO TABS
1.0000 | ORAL_TABLET | Freq: Two times a day (BID) | ORAL | Status: DC
  Administered 2016-05-06 – 2016-05-10 (×9): 1 via ORAL
  Filled 2016-05-06 (×9): qty 1

## 2016-05-06 MED ORDER — ACETAMINOPHEN 650 MG RE SUPP: 650.0000 mg | Freq: Four times a day (QID) | RECTAL | Status: DC | PRN

## 2016-05-06 MED ORDER — NITROGLYCERIN 0.4 MG SL SUBL: 0.4000 mg | SUBLINGUAL_TABLET | SUBLINGUAL | Status: DC | PRN

## 2016-05-06 MED ORDER — SODIUM CHLORIDE 0.9% FLUSH
3.0000 mL | Freq: Two times a day (BID) | INTRAVENOUS | Status: DC
  Administered 2016-05-06 – 2016-05-10 (×7): 3 mL via INTRAVENOUS

## 2016-05-06 MED ORDER — ISOSORB DINITRATE-HYDRALAZINE 20-37.5 MG PO TABS
1.0000 | ORAL_TABLET | Freq: Three times a day (TID) | ORAL | Status: DC
  Administered 2016-05-06 – 2016-05-08 (×7): 1 via ORAL
  Filled 2016-05-06 (×9): qty 1

## 2016-05-06 MED ORDER — FUROSEMIDE 10 MG/ML IJ SOLN
40.0000 mg | INTRAMUSCULAR | Status: AC
  Administered 2016-05-06: 40 mg via INTRAVENOUS
  Filled 2016-05-06: qty 4

## 2016-05-06 MED ORDER — PREGABALIN 75 MG PO CAPS
75.0000 mg | ORAL_CAPSULE | Freq: Every day | ORAL | Status: DC
  Administered 2016-05-06 – 2016-05-10 (×5): 75 mg via ORAL
  Filled 2016-05-06 (×5): qty 1

## 2016-05-06 MED ORDER — ONDANSETRON HCL 4 MG PO TABS
4.0000 mg | ORAL_TABLET | Freq: Three times a day (TID) | ORAL | Status: DC | PRN
  Administered 2016-05-06 – 2016-05-07 (×2): 4 mg via ORAL
  Filled 2016-05-06 (×2): qty 1

## 2016-05-06 MED ORDER — GUAIFENESIN-DM 100-10 MG/5ML PO SYRP
5.0000 mL | ORAL_SOLUTION | ORAL | Status: DC | PRN
  Administered 2016-05-06 – 2016-05-10 (×6): 5 mL via ORAL
  Filled 2016-05-06 (×7): qty 5

## 2016-05-06 MED ORDER — ESCITALOPRAM OXALATE 10 MG PO TABS
20.0000 mg | ORAL_TABLET | Freq: Every day | ORAL | Status: DC
  Administered 2016-05-06 – 2016-05-10 (×5): 20 mg via ORAL
  Filled 2016-05-06 (×5): qty 2

## 2016-05-06 MED ORDER — SPIRONOLACTONE 25 MG PO TABS
25.0000 mg | ORAL_TABLET | Freq: Every day | ORAL | Status: DC
  Administered 2016-05-06 – 2016-05-10 (×5): 25 mg via ORAL
  Filled 2016-05-06 (×6): qty 1

## 2016-05-06 MED ORDER — PROMETHAZINE HCL 25 MG PO TABS
12.5000 mg | ORAL_TABLET | Freq: Four times a day (QID) | ORAL | Status: DC | PRN
  Administered 2016-05-08: 12.5 mg via ORAL
  Filled 2016-05-06: qty 1

## 2016-05-06 MED ORDER — CARVEDILOL 12.5 MG PO TABS
12.5000 mg | ORAL_TABLET | Freq: Two times a day (BID) | ORAL | Status: DC
  Administered 2016-05-06 – 2016-05-10 (×9): 12.5 mg via ORAL
  Filled 2016-05-06 (×9): qty 1

## 2016-05-06 MED ORDER — LORATADINE 10 MG PO TABS
10.0000 mg | ORAL_TABLET | Freq: Every day | ORAL | Status: DC
  Administered 2016-05-06 – 2016-05-10 (×5): 10 mg via ORAL
  Filled 2016-05-06 (×5): qty 1

## 2016-05-06 MED ORDER — METFORMIN HCL 500 MG PO TABS
500.0000 mg | ORAL_TABLET | Freq: Two times a day (BID) | ORAL | Status: DC
  Administered 2016-05-06: 500 mg via ORAL
  Filled 2016-05-06: qty 1

## 2016-05-06 MED ORDER — INSULIN ASPART 100 UNIT/ML ~~LOC~~ SOLN
0.0000 [IU] | Freq: Every day | SUBCUTANEOUS | Status: DC
  Administered 2016-05-07: 2 [IU] via SUBCUTANEOUS
  Administered 2016-05-08: 3 [IU] via SUBCUTANEOUS

## 2016-05-06 MED ORDER — SODIUM CHLORIDE 0.9 % IV SOLN: 250.0000 mL | INTRAVENOUS | Status: DC | PRN

## 2016-05-06 MED ORDER — ENOXAPARIN SODIUM 60 MG/0.6ML ~~LOC~~ SOLN
55.0000 mg | SUBCUTANEOUS | Status: DC
  Administered 2016-05-06 – 2016-05-09 (×4): 55 mg via SUBCUTANEOUS
  Filled 2016-05-06 (×4): qty 0.6

## 2016-05-06 MED ORDER — TORSEMIDE 20 MG PO TABS: 40.0000 mg | ORAL_TABLET | Freq: Every day | ORAL | Status: DC

## 2016-05-06 MED ORDER — FUROSEMIDE 10 MG/ML IJ SOLN
80.0000 mg | Freq: Two times a day (BID) | INTRAMUSCULAR | Status: DC
  Administered 2016-05-06: 80 mg via INTRAVENOUS
  Filled 2016-05-06: qty 8

## 2016-05-06 MED ORDER — INSULIN ASPART 100 UNIT/ML ~~LOC~~ SOLN
0.0000 [IU] | Freq: Three times a day (TID) | SUBCUTANEOUS | Status: DC
  Administered 2016-05-07: 2 [IU] via SUBCUTANEOUS
  Administered 2016-05-07: 3 [IU] via SUBCUTANEOUS
  Administered 2016-05-07: 2 [IU] via SUBCUTANEOUS
  Administered 2016-05-08 (×2): 3 [IU] via SUBCUTANEOUS
  Administered 2016-05-08: 1 [IU] via SUBCUTANEOUS
  Administered 2016-05-09 (×3): 2 [IU] via SUBCUTANEOUS
  Administered 2016-05-10: 5 [IU] via SUBCUTANEOUS
  Administered 2016-05-10: 2 [IU] via SUBCUTANEOUS

## 2016-05-06 MED ORDER — DIPHENHYDRAMINE HCL 25 MG PO CAPS
25.0000 mg | ORAL_CAPSULE | Freq: Three times a day (TID) | ORAL | Status: DC | PRN
  Administered 2016-05-07 (×2): 25 mg via ORAL
  Filled 2016-05-06 (×2): qty 1

## 2016-05-06 MED ORDER — PHENOL 1.4 % MT LIQD
1.0000 | OROMUCOSAL | Status: DC | PRN
  Administered 2016-05-06: 1 via OROMUCOSAL
  Filled 2016-05-06: qty 177

## 2016-05-06 MED ORDER — SODIUM CHLORIDE 0.9% FLUSH: 3.0000 mL | INTRAVENOUS | Status: DC | PRN

## 2016-05-06 MED ORDER — ENOXAPARIN SODIUM 40 MG/0.4ML ~~LOC~~ SOLN: 40.0000 mg | SUBCUTANEOUS | Status: DC

## 2016-05-06 MED ORDER — SODIUM CHLORIDE 0.9% FLUSH
3.0000 mL | Freq: Two times a day (BID) | INTRAVENOUS | Status: DC
Start: 2016-05-06 — End: 2016-05-10
  Administered 2016-05-08 – 2016-05-09 (×3): 3 mL via INTRAVENOUS

## 2016-05-06 NOTE — ED Notes (Signed)
Gave pt a warm blanket and cup of ice per Christina(RN)

## 2016-05-06 NOTE — ED Notes (Signed)
Gave pt a cup of orange juice per Elizabeth(RN)

## 2016-05-06 NOTE — ED Provider Notes (Signed)
[facility] DEPT Provider Note   CSN: [id number] Arrival date & time: 05/06/16  0606     History   Chief Complaint Chief Complaint  Patient presents with  . Shortness of Breath  . Congestive Heart Failure    HPI Chelsey Santiago is a 40 y.o. female.  Patient with PMH of CHF, DM, HTN, presents to the ED with a chief complaint of SOB.  She reports that the symptoms have been worsening over the past few days.  She states that she has had a productive cough, but denies having any fevers.  She reports worsening swelling in her legs.  She states that she stayed at a friend's house last night and did not take any of her morning medications.  She denies any other associated symptoms.  She was recently admitted over Thanksgiving for the same.   The history is provided by the patient. No language interpreter was used.    Past Medical History:  Diagnosis Date  . Anemia    H&H of 10.6/33 and 07/2008 and 11.9/35 and 09/2010  . Anxiety   . CHF (congestive heart failure) ([place])   . Depression with anxiety   . Diabetes mellitus without complication ([place])   . Enlarged heart   . Fasting hyperglycemia   . Hypertension    Lab: Normal BMet except glucose of 118 in 09/2010  . Hypertensive heart disease 2009   Pulmonary edema postpartum; mild to moderate mitral regurgitation when hospitalized for CHF in 2009; Echocardiogram in 12/2009-no MR and normal EF; normal CXR in 09/2010  . Migraine headache   . Miscarriage 03/19/2013  . Obesity 04/16/2009  . Osteoarthritis, knee 03/29/2011  . Preeclampsia   . Pregnant   . Pulmonary edema   . Sleep apnea   . Threatened abortion in early pregnancy 03/15/2013    Patient Active Problem List   Diagnosis Date Noted  . Depression 04/16/2016  . Chronic tension-type headache, not intractable 04/16/2016  . AKI (acute kidney injury) ([place])   . Hyperkalemia   . Nonischemic cardiomyopathy ([place])   . Acute on chronic combined systolic and diastolic ACC/AHA stage  C congestive heart failure ([place]) 04/03/2016  . Acute on chronic combined systolic and diastolic CHF, NYHA class 4 ([place]) 04/03/2016  . Hypertensive emergency 04/03/2016  . Cardiomyopathy due to hypertension ([place]) 12/22/2015  . Normal coronary arteries 12/22/2015  . Troponin level elevated 12/22/2015  . NSTEMI (non-ST elevated myocardial infarction) ([place]) 12/20/2015  . Dental infection 10/10/2015  . Chest pain 09/05/2015  . Systolic CHF, chronic ([place]) 09/05/2015  . LLQ pain   . Type 2 diabetes mellitus without complication, without long-term current use of insulin ([place]) 04/28/2014  . Essential hypertension   . 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 Hypertensive urgency 04/22/2014  . Acute CHF ([place]) 04/22/2014  . S/P cesarean section 04/11/2014  . Acute pulmonary edema ([place]) 04/11/2014  . Postoperative anemia 04/11/2014  . Elevated serum creatinine 04/11/2014  . Preeclampsia, severe 04/09/2014  . Pre-eclampsia superimposed on chronic hypertension, antepartum 04/08/2014  . Dyspnea 04/08/2014  . Polyhydramnios in third trimester, antepartum 03/14/2014  . Abnormal maternal glucose tolerance, antepartum 03/11/2014  . High-risk pregnancy 03/11/2014  . Chronic hypertension in pregnancy 03/11/2014  . Impaired glucose tolerance during pregnancy, antepartum 11/27/2013  . Fibroids 11/22/2013  . History of gestational diabetes in prior pregnancy, currently pregnant in first trimester 11/22/2013  . Hx of preeclampsia, prior pregnancy, currently pregnant 11/22/2013  . Short interval between pregnancies affecting pregnancy, antepartum 11/22/2013  . Supervision  of high-risk pregnancy of elderly primigravida (>= 64 years old at delivery), third trimester 11/22/2013  . Miscarriage 03/19/2013  . 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 Hypertension   . Hypertensive cardiovascular disease   . Microcytic anemia   . Osteoarthritis, knee 03/29/2011  . Sleep apnea 12/09/2009  . Obesity 04/16/2009    Past  Surgical History:  Procedure Laterality Date  . BREAST REDUCTION SURGERY  2002  . CARDIAC CATHETERIZATION N/A 12/22/2015   Procedure: Left Heart Cath and Coronary Angiography;  Surgeon: Peter M Martinique, MD;  Location: [facility];  Service: Cardiovascular;  Laterality: N/A;  . CESAREAN SECTION N/A 04/09/2014   Procedure: CESAREAN SECTION;  Surgeon: Mora Bellman, MD;  Location: [facility];  Service: Obstetrics;  Laterality: N/A;  . CHOLECYSTECTOMY      OB History    Gravida Para Term Preterm AB Living   11 6 5 1 5 6    SAB TAB Ectopic Multiple Live Births   3 2   0 6       Home Medications    Prior to Admission medications   Medication Sig Start Date End Date Taking? Authorizing Provider  aspirin EC 81 MG tablet Take 81 mg by mouth daily.    Historical Provider, MD  carvedilol (COREG) 12.5 MG tablet Take 1 tablet (12.5 mg total) by mouth 2 (two) times daily with a meal. 04/09/16   Mauricio Gerome Apley, MD  escitalopram (LEXAPRO) 20 MG tablet Take 1 tablet (20 mg total) by mouth daily. 03/26/16   Estill Dooms, NP  isosorbide-hydrALAZINE (BIDIL) 20-37.5 MG tablet Take 1 tablet by mouth 3 (three) times daily.    Historical Provider, MD  Lancets (FREESTYLE) lancets Use as instructed 04/09/16   Mauricio Gerome Apley, MD  metFORMIN (GLUCOPHAGE) 500 MG tablet Take 1 tablet (500 mg total) by mouth 2 (two) times daily with a meal. 12/25/15   Erlene Quan, PA-C  nitroGLYCERIN (NITROSTAT) 0.4 MG SL tablet Place 1 tablet (0.4 mg total) under the tongue every 5 (five) minutes as needed for chest pain. 09/05/15   Kathie Dike, MD  ondansetron (ZOFRAN) 4 MG tablet Take 1 tablet (4 mg total) by mouth every 8 (eight) hours as needed for nausea. 04/09/16   Mauricio Gerome Apley, MD  potassium chloride SA (K-DUR,KLOR-CON) 20 MEQ tablet Take 1 tablet (20 mEq total) by mouth daily. 04/09/16   Mauricio Gerome Apley, MD  pregabalin (LYRICA) 75 MG capsule Take 75 mg by mouth daily.    Historical  Provider, MD  sacubitril-valsartan (ENTRESTO) 97-103 MG Take 1 tablet by mouth 2 (two) times daily. 04/09/16   Mauricio Gerome Apley, MD  sitaGLIPtin (JANUVIA) 100 MG tablet Take 100 mg by mouth daily.    Historical Provider, MD  spironolactone (ALDACTONE) 25 MG tablet Take 1 tablet (25 mg total) by mouth daily. Patient not taking: Reported on 04/16/2016 02/24/15   Lendon Colonel, NP  torsemide (DEMADEX) 20 MG tablet Take 2 tablets (40 mg total) by mouth daily. 04/09/16   Mauricio Gerome Apley, MD    Family History Family History  Problem Relation Age of Onset  . Diabetes Mother   . Heart disease Mother   . Hyperlipidemia Paternal Grandfather   . Hypertension Paternal Grandfather   . Heart disease Father   . Heart disease Maternal Grandmother   . ADD / ADHD Son   . Hypertension Maternal Uncle   . Heart attack Brother   . Sudden death Neg Hx  Social History Social History  Substance Use Topics  . Smoking status: Never Smoker  . Smokeless tobacco: Never Used  . Alcohol use Yes     Comment: occ     Allergies   Diclofenac; Tramadol; and Vicodin [hydrocodone-acetaminophen]   Review of Systems Review of Systems  All other systems reviewed and are negative.    Physical Exam Updated Vital Signs BP (!) 204/112 (BP Location: Right Arm)   Pulse (!) 129   Temp 97.4 F (36.3 C)   Resp 24   Physical Exam  Constitutional: She is oriented to person, place, and time. She appears well-developed and well-nourished.  HENT:  Head: Normocephalic and atraumatic.  Eyes: Conjunctivae and EOM are normal. Pupils are equal, round, and reactive to light.  Neck: Normal range of motion. Neck supple.  Cardiovascular: Regular rhythm.  Exam reveals no gallop and no friction rub.   No murmur heard. Tachycardic, but coughing  Pulmonary/Chest: Effort normal. No respiratory distress. She has no wheezes. She has rales. She exhibits no tenderness.  Mild rales in lower lobes  Abdominal:  Soft. Bowel sounds are normal. She exhibits no distension and no mass. There is no tenderness. There is no rebound and no guarding.  Musculoskeletal: Normal range of motion. She exhibits edema. She exhibits no tenderness.  Bilateral extremity edema 2+  Neurological: She is alert and oriented to person, place, and time.  Skin: Skin is warm and dry.  Psychiatric: She has a normal mood and affect. Her behavior is normal. Judgment and thought content normal.  Nursing note and vitals reviewed.    ED Treatments / Results  Labs (all labs ordered are listed, but only abnormal results are displayed) Labs Reviewed  BASIC METABOLIC PANEL - Abnormal; Notable for the following:       Result Value   Chloride 100 (*)    Glucose, Bld 173 (*)    Creatinine, Ser 1.18 (*)    GFR calc non Af Amer 57 (*)    All other components within normal limits  CBC - Abnormal; Notable for the following:    WBC 15.2 (*)    Hemoglobin 10.7 (*)    HCT 34.8 (*)    MCV 70.6 (*)    MCH 21.7 (*)    RDW 19.2 (*)    All other components within normal limits  BRAIN NATRIURETIC PEPTIDE - Abnormal; Notable for the following:    B Natriuretic Peptide 2,932.4 (*)    All other components within normal limits  I-STAT TROPOININ, ED    EKG  EKG Interpretation  Date/Time:  Thursday May 06 2016 06:34:46 EST Ventricular Rate:  103 PR Interval:    QRS Duration: 96 QT Interval:  373 QTC Calculation: 489 R Axis:   107 Text Interpretation:  Sinus tachycardia Ventricular premature complex Consider right atrial enlargement Right axis deviation Borderline repolarization abnormality Borderline prolonged QT interval Baseline wander in lead(s) V3 V4 V5 V6 No significant change since last tracing Confirmed by Kenna Gilbert, KRISTEN [phone]) on 05/06/2016 6:50:47 AM       Radiology Dg Chest 2 View  Result Date: 05/06/2016 CLINICAL DATA:  Cough for 2 weeks, worsening yesterday. EXAM: CHEST  2 VIEW COMPARISON:  04/03/2016  FINDINGS: Moderate cardiomegaly with mild tortuosity of the thoracic aorta. Mild central airway thickening with indistinct linear opacities at the right lung base likely within the right lower lobe. The Mild thoracic spondylosis. No significant blunting of the costophrenic angles. IMPRESSION: 1. Moderate cardiomegaly. 2. Streaky opacities  at the right lung base could represent atelectasis or early bronchopneumonia. 3. Airway thickening is present, suggesting bronchitis or reactive airways disease. Electronically Signed   By: Van Clines M.D.   On: 05/06/2016 07:23    Procedures Procedures (including critical care time)  Medications Ordered in ED Medications  furosemide (LASIX) injection 40 mg (not administered)     Initial Impression / Assessment and Plan / ED Course  I have reviewed the triage vital signs and the nursing notes.  Pertinent labs & imaging results that were available during my care of the patient were reviewed by me and considered in my medical decision making (see chart for details).  Clinical Course     Patient with SOB, worsening for the past few days.  Probable CHF exacerbation.    BNP is 2900.  CXR shows possible pneumonia.  Didn't get home meds.  Will order these now.  She does not appear in extremis. Will give some lasix.   Patient seen by and discussed with Dr. Vanita Panda, who agrees with plan for admission.  Discussed with Dr. Marily Memos, who will admit the patient to telemetry.  Final Clinical Impressions(s) / ED Diagnoses   Final diagnoses:  Acute on chronic congestive heart failure, unspecified congestive heart failure type [facility])    New Prescriptions New Prescriptions   No medications on file     Montine Circle, PA-C 05/06/16 [id number]    Carmin Muskrat, MD 05/06/16 [phone]

## 2016-05-06 NOTE — ED Notes (Signed)
MD paged and notified about troponin

## 2016-05-06 NOTE — ED Notes (Signed)
Patient transported to X-ray 

## 2016-05-06 NOTE — H&P (Signed)
History and Physical    RASHIDA DEVON [id number] DOB: Aug 23, 1975 DOA: 05/06/2016  PCP: Rosita Fire, MD Patient coming from: Home  Chief Complaint: sob  HPI: MARIANGELA LANTHIER is a 40 y.o. female with medical history significant of anemia, CHF, DM, CHF, migraines, HTN presenting w/ several day of his breath. Associated with orthopnea, nausea and vomiting which patient states are her typical symptoms for CHF exacerbation. Denies any productive cough, fevers, chest pain, palpitations, abdominal pain, dysuria, frequency, back pain, neck stiffness, headache, LOC. States she's been compliant with her home medical regimen including her home torsemide 20 mg daily. Patient do not attempt any increase dose of torsemide as she oftentimes does along with leg elevation.  ED Course: Lasix 40 mg IV given with increased diuresis acheived  Review of Systems: As per HPI otherwise 10 point review of systems negative.   Ambulatory Status: no restrictions  Past Medical History:  Diagnosis Date  . Anemia    H&H of 10.6/33 and 07/2008 and 11.9/35 and 09/2010  . Anxiety   . CHF (congestive heart failure) ([place])   . Depression with anxiety   . Diabetes mellitus without complication ([place])   . Enlarged heart   . Fasting hyperglycemia   . Hypertension    Lab: Normal BMet except glucose of 118 in 09/2010  . Hypertensive heart disease 2009   Pulmonary edema postpartum; mild to moderate mitral regurgitation when hospitalized for CHF in 2009; Echocardiogram in 12/2009-no MR and normal EF; normal CXR in 09/2010  . Migraine headache   . Miscarriage 03/19/2013  . Obesity 04/16/2009  . Osteoarthritis, knee 03/29/2011  . Preeclampsia   . Pregnant   . Pulmonary edema   . Sleep apnea   . Threatened abortion in early pregnancy 03/15/2013    Past Surgical History:  Procedure Laterality Date  . BREAST REDUCTION SURGERY  2002  . CARDIAC CATHETERIZATION N/A 12/22/2015   Procedure: Left Heart Cath and Coronary  Angiography;  Surgeon: Peter M Martinique, MD;  Location: [facility];  Service: Cardiovascular;  Laterality: N/A;  . CESAREAN SECTION N/A 04/09/2014   Procedure: CESAREAN SECTION;  Surgeon: Mora Bellman, MD;  Location: [facility];  Service: Obstetrics;  Laterality: N/A;  . CHOLECYSTECTOMY      Social History   Social History  . Marital status: Single    Spouse name: N/A  . Number of children: N/A  . Years of education: N/A   Occupational History  . unemployed Unemployed   Social History Main Topics  . Smoking status: Never Smoker  . Smokeless tobacco: Never Used  . Alcohol use Yes     Comment: occ  . Drug use: No  . Sexual activity: Not Currently    Birth control/ protection: Surgical     Comment: tubal   Other Topics Concern  . Not on file   Social History Narrative   Lives in [place]   Engaged/boyfriend (father to youngest chid)   4 children: daughter (30 as of 2013), sons (15, 53, 4 as of 2013)   Religion: christian    Allergies  Allergen Reactions  . Diclofenac Swelling    AND POSSIBLE SYNCOPE  . Tramadol Nausea And Vomiting  . Vicodin [Hydrocodone-Acetaminophen] Itching and Nausea Only    Family History  Problem Relation Age of Onset  . Diabetes Mother   . Heart disease Mother   . Hyperlipidemia Paternal Grandfather   . Hypertension Paternal Grandfather   . Heart disease Father   . Heart disease  Maternal Grandmother   . ADD / ADHD Son   . Hypertension Maternal Uncle   . Heart attack Brother   . Sudden death Neg Hx     Prior to Admission medications   Medication Sig Start Date End Date Taking? Authorizing Provider  aspirin EC 81 MG tablet Take 81 mg by mouth daily.    Historical Provider, MD  carvedilol (COREG) 12.5 MG tablet Take 1 tablet (12.5 mg total) by mouth 2 (two) times daily with a meal. 04/09/16   Mauricio Gerome Apley, MD  escitalopram (LEXAPRO) 20 MG tablet Take 1 tablet (20 mg total) by mouth daily. 03/26/16   Estill Dooms, NP    isosorbide-hydrALAZINE (BIDIL) 20-37.5 MG tablet Take 1 tablet by mouth 3 (three) times daily.    Historical Provider, MD  Lancets (FREESTYLE) lancets Use as instructed 04/09/16   Mauricio Gerome Apley, MD  metFORMIN (GLUCOPHAGE) 500 MG tablet Take 1 tablet (500 mg total) by mouth 2 (two) times daily with a meal. 12/25/15   Erlene Quan, PA-C  nitroGLYCERIN (NITROSTAT) 0.4 MG SL tablet Place 1 tablet (0.4 mg total) under the tongue every 5 (five) minutes as needed for chest pain. 09/05/15   Kathie Dike, MD  ondansetron (ZOFRAN) 4 MG tablet Take 1 tablet (4 mg total) by mouth every 8 (eight) hours as needed for nausea. 04/09/16   Mauricio Gerome Apley, MD  potassium chloride SA (K-DUR,KLOR-CON) 20 MEQ tablet Take 1 tablet (20 mEq total) by mouth daily. 04/09/16   Mauricio Gerome Apley, MD  pregabalin (LYRICA) 75 MG capsule Take 75 mg by mouth daily.    Historical Provider, MD  sacubitril-valsartan (ENTRESTO) 97-103 MG Take 1 tablet by mouth 2 (two) times daily. 04/09/16   Mauricio Gerome Apley, MD  sitaGLIPtin (JANUVIA) 100 MG tablet Take 100 mg by mouth daily.    Historical Provider, MD  spironolactone (ALDACTONE) 25 MG tablet Take 1 tablet (25 mg total) by mouth daily. Patient not taking: Reported on 04/16/2016 02/24/15   Lendon Colonel, NP  torsemide (DEMADEX) 20 MG tablet Take 2 tablets (40 mg total) by mouth daily. 04/09/16   Mauricio Gerome Apley, MD    Physical Exam: Vitals:   05/06/16 0625  BP: (!) 204/112  Pulse: (!) 129  Resp: 24  Temp: 97.4 F (36.3 C)     General:  Appears calm and comfortable Eyes:  PERRL, EOMI, normal lids, iris ENT:  grossly normal hearing, lips & tongue, mmm Neck:  no LAD, masses or thyromegaly Cardiovascular:  RRR, no m/r/g. No LE edema.  Respiratory:  Mild increased effort. Good air movement. Few crackles in the bases.  Abdomen:  soft, ntnd, NABS Skin:  no rash or induration seen on limited exam Musculoskeletal:  grossly normal tone  BUE/BLE, good ROM, no bony abnormality Psychiatric:  grossly normal mood and affect, speech fluent and appropriate, AOx3 Neurologic:  CN 2-12 grossly intact, moves all extremities in coordinated fashion, sensation intact  Labs on Admission: I have personally reviewed following labs and imaging studies  CBC:  Recent Labs Lab 05/06/16 0620  WBC 15.2*  HGB 10.7*  HCT 34.8*  MCV 70.6*  PLT [id number]   Basic Metabolic Panel:  Recent Labs Lab 05/06/16 0620  NA 138  K 4.0  CL 100*  CO2 28  GLUCOSE 173*  BUN 12  CREATININE 1.18*  CALCIUM 9.1   GFR: CrCl cannot be calculated (Unknown ideal weight.). Liver Function Tests: No results for input(s): AST, ALT, ALKPHOS, BILITOT, PROT,  ALBUMIN in the last 168 hours. No results for input(s): LIPASE, AMYLASE in the last 168 hours. No results for input(s): AMMONIA in the last 168 hours. Coagulation Profile: No results for input(s): INR, PROTIME in the last 168 hours. Cardiac Enzymes: No results for input(s): CKTOTAL, CKMB, CKMBINDEX, TROPONINI in the last 168 hours. BNP (last 3 results) No results for input(s): PROBNP in the last 8760 hours. HbA1C: No results for input(s): HGBA1C in the last 72 hours. CBG: No results for input(s): GLUCAP in the last 168 hours. Lipid Profile: No results for input(s): CHOL, HDL, LDLCALC, TRIG, CHOLHDL, LDLDIRECT in the last 72 hours. Thyroid Function Tests: No results for input(s): TSH, T4TOTAL, FREET4, T3FREE, THYROIDAB in the last 72 hours. Anemia Panel: No results for input(s): VITAMINB12, FOLATE, FERRITIN, TIBC, IRON, RETICCTPCT in the last 72 hours. Urine analysis:    Component Value Date/Time   COLORURINE YELLOW 12/08/2015 0045   APPEARANCEUR CLOUDY (A) 12/08/2015 0045   LABSPEC 1.029 12/08/2015 0045   PHURINE 5.5 12/08/2015 0045   GLUCOSEU 100 (A) 12/08/2015 0045   HGBUR NEGATIVE 12/08/2015 0045   BILIRUBINUR NEGATIVE 12/08/2015 0045   KETONESUR NEGATIVE 12/08/2015 0045   PROTEINUR 100 (A)  12/08/2015 0045   UROBILINOGEN 0.2 04/29/2014 1115   NITRITE NEGATIVE 12/08/2015 0045   LEUKOCYTESUR NEGATIVE 12/08/2015 0045    Creatinine Clearance: CrCl cannot be calculated (Unknown ideal weight.).  Sepsis Labs: @LABRCNTIP (procalcitonin:4,lacticidven:4) )No results found for this or any previous visit (from the past 240 hour(s)).   Radiological Exams on Admission: Dg Chest 2 View  Result Date: 05/06/2016 CLINICAL DATA:  Cough for 2 weeks, worsening yesterday. EXAM: CHEST  2 VIEW COMPARISON:  04/03/2016 FINDINGS: Moderate cardiomegaly with mild tortuosity of the thoracic aorta. Mild central airway thickening with indistinct linear opacities at the right lung base likely within the right lower lobe. The Mild thoracic spondylosis. No significant blunting of the costophrenic angles. IMPRESSION: 1. Moderate cardiomegaly. 2. Streaky opacities at the right lung base could represent atelectasis or early bronchopneumonia. 3. Airway thickening is present, suggesting bronchitis or reactive airways disease. Electronically Signed   By: Van Clines M.D.   On: 05/06/2016 07:23    EKG: Independently reviewed. No ACS. sinus  Assessment/Plan Active Problems:   Obesity   Resistant hypertension   Acute on chronic combined systolic and diastolic CHF, NYHA class 4 (H[facility])   Depression   Diabetes mellitus with complication ([facility])   Leukocytosis   Neuropathy ([facility])   Acute on chronic combined diastolic and systolic congestive heart failure. BNP 2932, cardiomegaly on x-ray, troponin normal. Endorsing weight gain, lower extremity edema and orthopnea. Last Echo showing EF of 40% and grade 2 diastolic dysfunction. - Telemetry - Strict I's and O's, daily weights - Continue spironolactone, beta blocker, ACE inhibitor - Lasix 80 mg IV twice a day - Contniue ASA  Leukocytosis: 15.2. No differential. Suspect largely due to acute stress reaction. Chest x-ray concerning for possible pneumonia but patient  is afebrile and not endorsing any significant respiratory symptoms to suggest pneumonia. - CBC in a.m. - Pro calcitonin - Start antibiotics if condition worsens  C KD: Creatinine 1.18. Near baseline. - BMP in a.m.  Diabetes: - SSI  Hypertension:  - continue Entresto, spironolactone, Bidil, Coreg  Depression: - Continue Lexapro  Neuropathy: - continue lyrica    DVT prophylaxis: Lovenox  Code Status: full  Family Communication: mother  Disposition Plan: pending improvement in respiratory status  Consults called: none  Admission status: inpt    MERRELL,  DAVID J MD [facility]  If 7PM-7AM, please contact night-coverage [email or web address] Password [facility]  05/06/2016, 8:55 AM

## 2016-05-06 NOTE — ED Notes (Signed)
RN introduced self to pt. No needs; lung sounds clear; bilateral edema presnet.

## 2016-05-06 NOTE — ED Notes (Signed)
Gave pt [place] sandwich with apple sauce per Elizabeth(RN)

## 2016-05-06 NOTE — ED Notes (Signed)
Pt eating meal tray 

## 2016-05-06 NOTE — ED Notes (Signed)
Pt CBG was 184, notified Christina(RN)

## 2016-05-06 NOTE — ED Triage Notes (Addendum)
Patient reports worsening SOB  , productive cough and chest congestion onset Sunday , denies fever or chills . Pt. added bilateral lower legs edema .

## 2016-05-06 NOTE — Care Management Note (Signed)
Case Management Note  Patient Details  Name: Chelsey Santiago MRN: [id number] Date of Birth: 1976-01-25  Subjective/Objective:                  40 y.o. female with h/o CHF, HTN, DM who presents to the Emergency Department complaining of persistent, gradually worsening SOB./ From home with children.  Action/Plan: Follow for disposition needs./ Admit to INPATIENT.   Expected Discharge Date:  05/09/16               Expected Discharge Plan:  Home/Self Care  In-House Referral:  NA  Discharge planning Services  CM Consult   Status of Service:  In process, will continue to follow    Additional Comments:  Fuller Mandril, RN 05/06/2016, 10:35 AM

## 2016-05-06 NOTE — Progress Notes (Signed)
Pt allergic to Tramadol and Vicodin. NP paged for alternative PRN pain med as the patient says Tylenol not enough. Will continue to monitor.

## 2016-05-06 NOTE — ED Notes (Signed)
Lunch tray ordered for patient.

## 2016-05-07 LAB — CBC
HCT: 33.4 % — ABNORMAL LOW (ref 36.0–46.0)
HEMOGLOBIN: 9.8 g/dL — AB (ref 12.0–15.0)
MCH: 20.8 pg — ABNORMAL LOW (ref 26.0–34.0)
MCHC: 29.3 g/dL — AB (ref 30.0–36.0)
MCV: 70.8 fL — ABNORMAL LOW (ref 78.0–100.0)
Platelets: 245 10*3/uL (ref 150–400)
RBC: 4.72 MIL/uL (ref 3.87–5.11)
RDW: 19 % — AB (ref 11.5–15.5)
WBC: 8.6 10*3/uL (ref 4.0–10.5)

## 2016-05-07 LAB — BASIC METABOLIC PANEL
Anion gap: 9 (ref 5–15)
BUN: 11 mg/dL (ref 6–20)
CALCIUM: 9.1 mg/dL (ref 8.9–10.3)
CHLORIDE: 95 mmol/L — AB (ref 101–111)
CO2: 34 mmol/L — ABNORMAL HIGH (ref 22–32)
CREATININE: 1.16 mg/dL — AB (ref 0.44–1.00)
GFR, EST NON AFRICAN AMERICAN: 58 mL/min — AB (ref >60)
Glucose, Bld: 196 mg/dL — ABNORMAL HIGH (ref 65–99)
Potassium: 3.1 mmol/L — ABNORMAL LOW (ref 3.5–5.1)
SODIUM: 138 mmol/L (ref 135–145)

## 2016-05-07 LAB — GLUCOSE, CAPILLARY
GLUCOSE-CAPILLARY: 195 mg/dL — AB (ref 65–99)
GLUCOSE-CAPILLARY: 248 mg/dL — AB (ref 65–99)
Glucose-Capillary: 237 mg/dL — ABNORMAL HIGH (ref 65–99)
Glucose-Capillary: 248 mg/dL — ABNORMAL HIGH (ref 65–99)
Glucose-Capillary: 163 mg/dL — ABNORMAL HIGH (ref 65–99)

## 2016-05-07 LAB — MRSA PCR SCREENING: MRSA BY PCR: NEGATIVE

## 2016-05-07 MED ORDER — HYDROCORTISONE 1 % EX CREA
TOPICAL_CREAM | CUTANEOUS | Status: DC | PRN
  Administered 2016-05-07: 03:00:00 via TOPICAL
  Filled 2016-05-07: qty 28

## 2016-05-07 MED ORDER — FUROSEMIDE 10 MG/ML IJ SOLN
40.0000 mg | Freq: Two times a day (BID) | INTRAMUSCULAR | Status: DC
  Administered 2016-05-07: 40 mg via INTRAVENOUS
  Filled 2016-05-07: qty 4

## 2016-05-07 MED ORDER — BUTALBITAL-APAP-CAFFEINE 50-325-40 MG PO TABS
1.0000 | ORAL_TABLET | ORAL | Status: DC | PRN
  Administered 2016-05-07 – 2016-05-09 (×7): 1 via ORAL
  Filled 2016-05-07 (×7): qty 1

## 2016-05-07 MED ORDER — FUROSEMIDE 10 MG/ML IJ SOLN
60.0000 mg | Freq: Two times a day (BID) | INTRAMUSCULAR | Status: DC
Start: 2016-05-07 — End: 2016-05-07
  Administered 2016-05-07: 60 mg via INTRAVENOUS
  Filled 2016-05-07: qty 6

## 2016-05-07 MED ORDER — POTASSIUM CHLORIDE CRYS ER 20 MEQ PO TBCR
40.0000 meq | EXTENDED_RELEASE_TABLET | Freq: Four times a day (QID) | ORAL | Status: AC
  Administered 2016-05-07 (×2): 40 meq via ORAL
  Filled 2016-05-07 (×2): qty 2

## 2016-05-07 NOTE — Progress Notes (Signed)
Pt with pruritic allergic reaction to Tramadol. Claritin ordered and given per standing orders. NP later called and orders received for Benadryl. Pt still without full relief. Hydrocortisone cream ordered and to be given by this RN per standing orders. Will continue to monitor.

## 2016-05-07 NOTE — Progress Notes (Addendum)
PROGRESS NOTE                                                                                                                                                                                                             Patient Demographics:    Chelsey Santiago, is a 40 y.o. female, DOB - 12-10-1975, AP:[id number]  Admit date - 05/06/2016   Admitting Physician Waldemar Dickens, MD  Outpatient Primary MD for the patient is Rosita Fire, MD  LOS - 1  Outpatient Specialists: Cardiology Dr Harl Bowie  Chief Complaint  Patient presents with  . Shortness of Breath  . Congestive Heart Failure       Brief Narrative   40 y.o. female with medical history significant of anemia, CHF, DM, CHF, migraines, HTN presenting w/ several day of his breath, admitted for acute on chronic combined CHF   Subjective:    Chelsey Santiago today has No chest pain, No abdominal pain - No Nausea, No Cough, she complains of headache   Assessment  & Plan :    Active Problems:   Obesity   Resistant hypertension   Acute on chronic combined systolic and diastolic CHF, NYHA class 4 (HCC)   Depression   Diabetes mellitus with complication (HCC)   Leukocytosis   Neuropathy (HCC)   Acute on chronic combined diastolic and systolic congestive heart failure.  - BNP 2932, cardiomegaly on x-ray, troponin normal. Endorsing weight gain, lower extremity edema and orthopnea. Last Echo showing EF of 40% and grade 2 diastolic dysfunction. - Continue with daily weight, strict ins and outs -4.3 L today, so we'll decrease her Lasix to 40 IV twice a day. - Continue spironolactone, beta blocker, ACE inhibitor - Contniue ASA  Hypokalemia - Repleted, recheck with Mg in a.m.  Leukocytosis:  -  Suspect largely due to acute stress reaction. Resolved this a.m..   CKD: Creatinine 1.18. Near baseline. - Monitor as on diuresis  Diabetes: - SSI  Hypertension:  - continue Entresto,  spironolactone, Bidil, Coreg  Depression: - Continue Lexapro  Neuropathy: - continue lyrica     Code Status : Full  Family Communication  : Non at bedside  Disposition Plan  : Home  Consults  :  None  Procedures  : None  DVT Prophylaxis  :  Lovenox   Lab Results  Component Value Date  PLT 245 05/07/2016    Antibiotics  :   Anti-infectives    None        Objective:   Vitals:   05/07/16 0642 05/07/16 0757 05/07/16 0800 05/07/16 1200  BP: (!) 154/101 (!) 160/111  113/65  Pulse: 75 88 98 79  Resp: 20   20  Temp: 98.1 F (36.7 C)  97.8 F (36.6 C) 98.1 F (36.7 C)  TempSrc: Oral  Oral Oral  SpO2: 97%  98% 95%  Weight:      Height:        Wt Readings from Last 3 Encounters:  05/07/16 108.5 kg (239 lb 1.6 oz)  04/16/16 110.7 kg (244 lb)  04/09/16 108.7 kg (239 lb 11.2 oz)     Intake/Output Summary (Last 24 hours) at 05/07/16 1413 Last data filed at 05/07/16 0830  Gross per 24 hour  Intake             1080 ml  Output             3350 ml  Net            -2270 ml     Physical Exam  Awake Alert, Oriented X 3, Supple Neck,No JVD, No cervical lymphadenopathy appriciated.  Symmetrical Chest wall movement, Good air movement bilaterally, CTAB RRR,No Gallops,Rubs or new Murmurs, No Parasternal Heave +ve B.Sounds, Abd Soft, No tenderness, , No rebound - guarding or rigidity. No Cyanosis, Clubbing ,  No new Rash or bruise  , pitting edema    Data Review:    CBC  Recent Labs Lab 05/06/16 0620 05/07/16 0404  WBC 15.2* 8.6  HGB 10.7* 9.8*  HCT 34.8* 33.4*  PLT 268 245  MCV 70.6* 70.8*  MCH 21.7* 20.8*  MCHC 30.7 29.3*  RDW 19.2* 19.0*    Chemistries   Recent Labs Lab 05/06/16 0620 05/07/16 0404  NA 138 138  K 4.0 3.1*  CL 100* 95*  CO2 28 34*  GLUCOSE 173* 196*  BUN 12 11  CREATININE 1.18* 1.16*  CALCIUM 9.1 9.1    ------------------------------------------------------------------------------------------------------------------ No results for input(s): CHOL, HDL, LDLCALC, TRIG, CHOLHDL, LDLDIRECT in the last 72 hours.  Lab Results  Component Value Date   HGBA1C 10.3 (H) 04/03/2016   ------------------------------------------------------------------------------------------------------------------ No results for input(s): TSH, T4TOTAL, T3FREE, THYROIDAB in the last 72 hours.  Invalid input(s): FREET3 ------------------------------------------------------------------------------------------------------------------ No results for input(s): VITAMINB12, FOLATE, FERRITIN, TIBC, IRON, RETICCTPCT in the last 72 hours.  Coagulation profile No results for input(s): INR, PROTIME in the last 168 hours.  No results for input(s): DDIMER in the last 72 hours.  Cardiac Enzymes  Recent Labs Lab 05/06/16 0855 05/06/16 1415 05/06/16 2252  TROPONINI 0.03* 0.03* <0.03   ------------------------------------------------------------------------------------------------------------------    Component Value Date/Time   BNP 2,932.4 (H) 05/06/2016 0620    Inpatient Medications  Scheduled Meds: . aspirin EC  81 mg Oral Daily  . carvedilol  12.5 mg Oral BID WC  . enoxaparin (LOVENOX) injection  55 mg Subcutaneous Q24H  . escitalopram  20 mg Oral Daily  . furosemide  40 mg Intravenous BID  . insulin aspart  0-5 Units Subcutaneous QHS  . insulin aspart  0-9 Units Subcutaneous TID WC  . isosorbide-hydrALAZINE  1 tablet Oral TID  . loratadine  10 mg Oral Daily  . potassium chloride SA  20 mEq Oral Daily  . potassium chloride  40 mEq Oral Q6H  . pregabalin  75 mg Oral Daily  . sacubitril-valsartan  1  tablet Oral BID  . sodium chloride flush  3 mL Intravenous Q12H  . sodium chloride flush  3 mL Intravenous Q12H  . spironolactone  25 mg Oral Daily   Continuous Infusions: PRN Meds:.sodium chloride,  acetaminophen **OR** acetaminophen, butalbital-acetaminophen-caffeine, diphenhydrAMINE, guaiFENesin-dextromethorphan, hydrALAZINE, hydrocortisone cream, nitroGLYCERIN, ondansetron, phenol, promethazine, sodium chloride flush  Micro Results Recent Results (from the past 240 hour(s))  MRSA PCR Screening     Status: None   Collection Time: 05/06/16  8:27 PM  Result Value Ref Range Status   MRSA by PCR NEGATIVE NEGATIVE Final    Comment:        The GeneXpert MRSA Assay (FDA approved for NASAL specimens only), is one component of a comprehensive MRSA colonization surveillance program. It is not intended to diagnose MRSA infection nor to guide or monitor treatment for MRSA infections.     Radiology Reports Dg Chest 2 View  Result Date: 05/06/2016 CLINICAL DATA:  Cough for 2 weeks, worsening yesterday. EXAM: CHEST  2 VIEW COMPARISON:  04/03/2016 FINDINGS: Moderate cardiomegaly with mild tortuosity of the thoracic aorta. Mild central airway thickening with indistinct linear opacities at the right lung base likely within the right lower lobe. The Mild thoracic spondylosis. No significant blunting of the costophrenic angles. IMPRESSION: 1. Moderate cardiomegaly. 2. Streaky opacities at the right lung base could represent atelectasis or early bronchopneumonia. 3. Airway thickening is present, suggesting bronchitis or reactive airways disease. Electronically Signed   By: Van Clines M.D.   On: 05/06/2016 07:23      [facility], Maston Wight M.D on 05/07/2016 at 2:13 PM  Between 7am to 7pm - Pager - [phone]  After 7pm go to [email or web address] - password [facility]  [facility] -  Office  [phone]

## 2016-05-08 ENCOUNTER — Encounter (HOSPITAL_COMMUNITY): Payer: Self-pay | Admitting: General Practice

## 2016-05-08 LAB — BASIC METABOLIC PANEL
Anion gap: 11 (ref 5–15)
BUN: 16 mg/dL (ref 6–20)
CALCIUM: 8.8 mg/dL — AB (ref 8.9–10.3)
CO2: 30 mmol/L (ref 22–32)
CREATININE: 1.24 mg/dL — AB (ref 0.44–1.00)
Chloride: 96 mmol/L — ABNORMAL LOW (ref 101–111)
GFR calc non Af Amer: 54 mL/min — ABNORMAL LOW (ref >60)
Glucose, Bld: 212 mg/dL — ABNORMAL HIGH (ref 65–99)
Potassium: 3.8 mmol/L (ref 3.5–5.1)
SODIUM: 137 mmol/L (ref 135–145)

## 2016-05-08 LAB — GLUCOSE, CAPILLARY
GLUCOSE-CAPILLARY: 204 mg/dL — AB (ref 65–99)
GLUCOSE-CAPILLARY: 148 mg/dL — AB (ref 65–99)
GLUCOSE-CAPILLARY: 259 mg/dL — AB (ref 65–99)
Glucose-Capillary: 226 mg/dL — ABNORMAL HIGH (ref 65–99)

## 2016-05-08 LAB — MAGNESIUM: Magnesium: 1.5 mg/dL — ABNORMAL LOW (ref 1.7–2.4)

## 2016-05-08 MED ORDER — HYDRALAZINE HCL 50 MG PO TABS: 50.0000 mg | ORAL_TABLET | Freq: Three times a day (TID) | ORAL | Status: DC

## 2016-05-08 MED ORDER — FUROSEMIDE 10 MG/ML IJ SOLN
60.0000 mg | Freq: Two times a day (BID) | INTRAMUSCULAR | Status: DC
  Administered 2016-05-08 – 2016-05-10 (×3): 60 mg via INTRAVENOUS
  Filled 2016-05-08 (×4): qty 6

## 2016-05-08 MED ORDER — MAGNESIUM SULFATE 2 GM/50ML IV SOLN
2.0000 g | Freq: Once | INTRAVENOUS | Status: AC
  Administered 2016-05-08: 2 g via INTRAVENOUS
  Filled 2016-05-08: qty 50

## 2016-05-08 MED ORDER — POTASSIUM CHLORIDE CRYS ER 20 MEQ PO TBCR
40.0000 meq | EXTENDED_RELEASE_TABLET | Freq: Once | ORAL | Status: AC
  Administered 2016-05-08: 40 meq via ORAL
  Filled 2016-05-08: qty 2

## 2016-05-08 MED ORDER — HYDRALAZINE HCL 25 MG PO TABS
25.0000 mg | ORAL_TABLET | Freq: Three times a day (TID) | ORAL | Status: DC
  Administered 2016-05-08 – 2016-05-09 (×3): 25 mg via ORAL
  Filled 2016-05-08 (×3): qty 1

## 2016-05-08 MED ORDER — FUROSEMIDE 10 MG/ML IJ SOLN
80.0000 mg | Freq: Two times a day (BID) | INTRAMUSCULAR | Status: DC
  Administered 2016-05-08: 80 mg via INTRAVENOUS
  Filled 2016-05-08: qty 8

## 2016-05-08 NOTE — Progress Notes (Signed)
PROGRESS NOTE                                                                                                                                                                                                             Patient Demographics:    Chelsey Santiago, is a 40 y.o. female, DOB - Mar 07, 1976, [id number]  Admit date - 05/06/2016   Admitting Physician Waldemar Dickens, MD  Outpatient Primary MD for the patient is Rosita Fire, MD  LOS - 2  Outpatient Specialists: Cardiology Dr Harl Bowie  Chief Complaint  Patient presents with  . Shortness of Breath  . Congestive Heart Failure       Brief Narrative   40 y.o. female with medical history significant of anemia, CHF, DM, CHF, migraines, HTN presenting w/ several day of his breath, admitted for acute on chronic combined CHF   Subjective:    Chelsey Santiago today has No chest pain, No abdominal pain - No Nausea, No Cough, she complains of headache   Assessment  & Plan :    Active Problems:   Obesity   Resistant hypertension   Acute on chronic combined systolic and diastolic CHF, NYHA class 4 (HCC)   Depression   Diabetes mellitus with complication (HCC)   Leukocytosis   Neuropathy (HCC)   Acute on chronic combined diastolic and systolic congestive heart failure.  - BNP 2932, cardiomegaly on x-ray, troponin normal. Endorsing weight gain, lower extremity edema and orthopnea. Last Echo showing EF of 40% and grade 2 diastolic dysfunction. - Continue with daily weight, strict ins and outs, Diuresis significantly slowed down after decreasing her Lasix, will go ahead and increase her Lasix to 60 mg IV twice a day. - Continue spironolactone, beta blocker, ACE inhibitor - Contniue ASA  Hypokalemia/hypomagnesemia - Repleted, recheck in a.m.  Leukocytosis:  -  Suspect largely due to acute stress reaction. Resolved this a.m..   CKD: Creatinine 1.18. Near baseline. - Monitor as on  diuresis  Diabetes: - SSI  Hypertension:  - continue Entresto, spironolactone, Bidil, Coreg  Depression: - Continue Lexapro  Neuropathy: - continue lyrica  Headache - Patient reports her headache after starting BiDil for last 6 month, reports it is significant, so we'll discontinue BiDil and start her only on hydralazine    Code Status : Full  Family Communication  : Non at  bedside  Disposition Plan  : Home  Consults  :  None  Procedures  : None  DVT Prophylaxis  :  Lovenox   Lab Results  Component Value Date   PLT 245 05/07/2016    Antibiotics  :   Anti-infectives    None        Objective:   Vitals:   05/08/16 0555 05/08/16 0558 05/08/16 0856 05/08/16 1210  BP: (!) 161/110 (!) 149/108 (!) 143/81 (!) 100/59  Pulse: 84  91 81  Resp:    18  Temp: 97.9 F (36.6 C)  98 F (36.7 C) 98 F (36.7 C)  TempSrc: Oral  Oral Oral  SpO2:   95% 98%  Weight: 108.2 kg (238 lb 9.6 oz)     Height:        Wt Readings from Last 3 Encounters:  05/08/16 108.2 kg (238 lb 9.6 oz)  04/16/16 110.7 kg (244 lb)  04/09/16 108.7 kg (239 lb 11.2 oz)     Intake/Output Summary (Last 24 hours) at 05/08/16 1336 Last data filed at 05/08/16 0833  Gross per 24 hour  Intake              580 ml  Output             1400 ml  Net             -820 ml     Physical Exam  Awake Alert, Oriented X 3, Supple Neck,No JVD, No cervical lymphadenopathy appriciated.  Symmetrical Chest wall movement, Good air movement bilaterally, CTAB RRR,No Gallops,Rubs or new Murmurs, No Parasternal Heave +ve B.Sounds, Abd Soft, No tenderness, , No rebound - guarding or rigidity. No Cyanosis, Clubbing ,  No new Rash or bruise  , pitting edema    Data Review:    CBC  Recent Labs Lab 05/06/16 0620 05/07/16 0404  WBC 15.2* 8.6  HGB 10.7* 9.8*  HCT 34.8* 33.4*  PLT 268 245  MCV 70.6* 70.8*  MCH 21.7* 20.8*  MCHC 30.7 29.3*  RDW 19.2* 19.0*    Chemistries   Recent Labs Lab  05/06/16 0620 05/07/16 0404 05/08/16 0225  NA 138 138 137  K 4.0 3.1* 3.8  CL 100* 95* 96*  CO2 28 34* 30  GLUCOSE 173* 196* 212*  BUN 12 11 16   CREATININE 1.18* 1.16* 1.24*  CALCIUM 9.1 9.1 8.8*  MG  --   --  1.5*   ------------------------------------------------------------------------------------------------------------------ No results for input(s): CHOL, HDL, LDLCALC, TRIG, CHOLHDL, LDLDIRECT in the last 72 hours.  Lab Results  Component Value Date   HGBA1C 10.3 (H) 04/03/2016   ------------------------------------------------------------------------------------------------------------------ No results for input(s): TSH, T4TOTAL, T3FREE, THYROIDAB in the last 72 hours.  Invalid input(s): FREET3 ------------------------------------------------------------------------------------------------------------------ No results for input(s): VITAMINB12, FOLATE, FERRITIN, TIBC, IRON, RETICCTPCT in the last 72 hours.  Coagulation profile No results for input(s): INR, PROTIME in the last 168 hours.  No results for input(s): DDIMER in the last 72 hours.  Cardiac Enzymes  Recent Labs Lab 05/06/16 0855 05/06/16 1415 05/06/16 2252  TROPONINI 0.03* 0.03* <0.03   ------------------------------------------------------------------------------------------------------------------    Component Value Date/Time   BNP 2,932.4 (H) 05/06/2016 0620    Inpatient Medications  Scheduled Meds: . aspirin EC  81 mg Oral Daily  . carvedilol  12.5 mg Oral BID WC  . enoxaparin (LOVENOX) injection  55 mg Subcutaneous Q24H  . escitalopram  20 mg Oral Daily  . furosemide  60 mg Intravenous BID  . hydrALAZINE  50 mg Oral Q8H  . insulin aspart  0-5 Units Subcutaneous QHS  . insulin aspart  0-9 Units Subcutaneous TID WC  . loratadine  10 mg Oral Daily  . magnesium sulfate 1 - 4 g bolus IVPB  2 g Intravenous Once  . potassium chloride SA  20 mEq Oral Daily  . potassium chloride  40 mEq Oral  Once  . pregabalin  75 mg Oral Daily  . sacubitril-valsartan  1 tablet Oral BID  . sodium chloride flush  3 mL Intravenous Q12H  . sodium chloride flush  3 mL Intravenous Q12H  . spironolactone  25 mg Oral Daily   Continuous Infusions: PRN Meds:.sodium chloride, acetaminophen **OR** acetaminophen, butalbital-acetaminophen-caffeine, diphenhydrAMINE, guaiFENesin-dextromethorphan, hydrALAZINE, hydrocortisone cream, nitroGLYCERIN, ondansetron, phenol, promethazine, sodium chloride flush  Micro Results Recent Results (from the past 240 hour(s))  MRSA PCR Screening     Status: None   Collection Time: 05/06/16  8:27 PM  Result Value Ref Range Status   MRSA by PCR NEGATIVE NEGATIVE Final    Comment:        The GeneXpert MRSA Assay (FDA approved for NASAL specimens only), is one component of a comprehensive MRSA colonization surveillance program. It is not intended to diagnose MRSA infection nor to guide or monitor treatment for MRSA infections.     Radiology Reports Dg Chest 2 View  Result Date: 05/06/2016 CLINICAL DATA:  Cough for 2 weeks, worsening yesterday. EXAM: CHEST  2 VIEW COMPARISON:  04/03/2016 FINDINGS: Moderate cardiomegaly with mild tortuosity of the thoracic aorta. Mild central airway thickening with indistinct linear opacities at the right lung base likely within the right lower lobe. The Mild thoracic spondylosis. No significant blunting of the costophrenic angles. IMPRESSION: 1. Moderate cardiomegaly. 2. Streaky opacities at the right lung base could represent atelectasis or early bronchopneumonia. 3. Airway thickening is present, suggesting bronchitis or reactive airways disease. Electronically Signed   By: Van Clines M.D.   On: 05/06/2016 07:23      [facility], DAWOOD M.D on 05/08/2016 at 1:36 PM  Between 7am to 7pm - Pager - [phone]  After 7pm go to [email or web address] - password [facility]  [facility] -  Office  [phone]

## 2016-05-09 LAB — GLUCOSE, CAPILLARY
GLUCOSE-CAPILLARY: 200 mg/dL — AB (ref 65–99)
Glucose-Capillary: 198 mg/dL — ABNORMAL HIGH (ref 65–99)
Glucose-Capillary: 195 mg/dL — ABNORMAL HIGH (ref 65–99)

## 2016-05-09 LAB — BASIC METABOLIC PANEL
Anion gap: 11 (ref 5–15)
BUN: 15 mg/dL (ref 6–20)
CHLORIDE: 97 mmol/L — AB (ref 101–111)
CO2: 29 mmol/L (ref 22–32)
Calcium: 9 mg/dL (ref 8.9–10.3)
Creatinine, Ser: 1.03 mg/dL — ABNORMAL HIGH (ref 0.44–1.00)
GFR calc Af Amer: >60 mL/min (ref >60)
GFR calc non Af Amer: >60 mL/min (ref >60)
Glucose, Bld: 194 mg/dL — ABNORMAL HIGH (ref 65–99)
POTASSIUM: 4.1 mmol/L (ref 3.5–5.1)
SODIUM: 137 mmol/L (ref 135–145)

## 2016-05-09 LAB — MAGNESIUM: MAGNESIUM: 2 mg/dL (ref 1.7–2.4)

## 2016-05-09 MED ORDER — HYDRALAZINE HCL 50 MG PO TABS
50.0000 mg | ORAL_TABLET | Freq: Three times a day (TID) | ORAL | Status: DC
  Administered 2016-05-09 – 2016-05-10 (×3): 50 mg via ORAL
  Filled 2016-05-09 (×3): qty 1

## 2016-05-09 NOTE — Progress Notes (Signed)
PROGRESS NOTE                                                                                                                                                                                                             Patient Demographics:    Chelsey Santiago, is a 40 y.o. female, DOB - 1975/12/16, QN:[id number]  Admit date - 05/06/2016   Admitting Physician Waldemar Dickens, MD  Outpatient Primary MD for the patient is Rosita Fire, MD  LOS - 3  Outpatient Specialists: Cardiology Dr Harl Bowie  Chief Complaint  Patient presents with  . Shortness of Breath  . Congestive Heart Failure       Brief Narrative   40 y.o. female with medical history significant of anemia, CHF, DM, CHF, migraines, HTN presenting w/ several day of his breath, admitted for acute on chronic combined CHF   Subjective:    Chelsey Santiago today has No chest pain, No abdominal pain - No Nausea, No Cough, she complains of headache   Assessment  & Plan :    Active Problems:   Obesity   Resistant hypertension   Acute on chronic combined systolic and diastolic CHF, NYHA class 4 (HCC)   Depression   Diabetes mellitus with complication (HCC)   Leukocytosis   Neuropathy (HCC)   Acute on chronic combined diastolic and systolic congestive heart failure.  - BNP 2932, cardiomegaly on x-ray, troponin normal. Endorsing weight gain, lower extremity edema and orthopnea. Last Echo showing EF of 40% and grade 2 diastolic dysfunction. - Continue with daily weight, strict ins and outs, Diuresis acceptable and 80 mg IV twice a day, but slowed yesterday given increase fluid intake, educated again about fluid restriction, so far no -6.3 L since admission . - Continue spironolactone, beta blocker, ACE inhibitor - Contniue ASA  Hypokalemia/hypomagnesemia - Repleted  Leukocytosis:  -  Suspect largely due to acute stress reaction. Resolved   CKD: Creatinine 1.18. Near baseline. - Monitor  as on diuresis  Diabetes: - SSI  Hypertension:  - continue Entresto, spironolactone,  Coreg, BiDil has been stopped given significant headache, started on hydralazine.  Depression: - Continue Lexapro  Neuropathy: - continue lyrica  Headache - Patient reports her headache after starting BiDil for last 6 month, reports it is significant, so we'll discontinue BiDil and start her only on hydralazine  Code Status : Full  Family Communication  : Non at bedside  Disposition Plan  : Home  Consults  :  None  Procedures  : None  DVT Prophylaxis  :  Lovenox   Lab Results  Component Value Date   PLT 245 05/07/2016    Antibiotics  :   Anti-infectives    None        Objective:   Vitals:   05/08/16 2003 05/09/16 0544 05/09/16 0606 05/09/16 0829  BP: 131/82 (!) 149/100 (!) 156/97 (!) 155/108  Pulse: 85 87 86 88  Resp: 18 18  18   Temp: 97.9 F (36.6 C) 97.7 F (36.5 C)    TempSrc: Oral Oral    SpO2: 91% 100%  100%  Weight:  106.9 kg (235 lb 9.6 oz)    Height:        Wt Readings from Last 3 Encounters:  05/09/16 106.9 kg (235 lb 9.6 oz)  04/16/16 110.7 kg (244 lb)  04/09/16 108.7 kg (239 lb 11.2 oz)     Intake/Output Summary (Last 24 hours) at 05/09/16 1159 Last data filed at 05/09/16 0900  Gross per 24 hour  Intake             1762 ml  Output             2901 ml  Net            -1139 ml     Physical Exam  Awake Alert, Oriented X 3, Supple Neck,No JVD, No cervical lymphadenopathy appriciated.  Symmetrical Chest wall movement, Good air movement bilaterally, CTAB RRR,No Gallops,Rubs or new Murmurs, No Parasternal Heave +ve B.Sounds, Abd Soft, No tenderness, , No rebound - guarding or rigidity. No Cyanosis, Clubbing ,  No new Rash or bruise  , pitting edema    Data Review:    CBC  Recent Labs Lab 05/06/16 0620 05/07/16 0404  WBC 15.2* 8.6  HGB 10.7* 9.8*  HCT 34.8* 33.4*  PLT 268 245  MCV 70.6* 70.8*  MCH 21.7* 20.8*  MCHC 30.7  29.3*  RDW 19.2* 19.0*    Chemistries   Recent Labs Lab 05/06/16 0620 05/07/16 0404 05/08/16 0225 05/09/16 0401  NA 138 138 137 137  K 4.0 3.1* 3.8 4.1  CL 100* 95* 96* 97*  CO2 28 34* 30 29  GLUCOSE 173* 196* 212* 194*  BUN 12 11 16 15   CREATININE 1.18* 1.16* 1.24* 1.03*  CALCIUM 9.1 9.1 8.8* 9.0  MG  --   --  1.5* 2.0   ------------------------------------------------------------------------------------------------------------------ No results for input(s): CHOL, HDL, LDLCALC, TRIG, CHOLHDL, LDLDIRECT in the last 72 hours.  Lab Results  Component Value Date   HGBA1C 10.3 (H) 04/03/2016   ------------------------------------------------------------------------------------------------------------------ No results for input(s): TSH, T4TOTAL, T3FREE, THYROIDAB in the last 72 hours.  Invalid input(s): FREET3 ------------------------------------------------------------------------------------------------------------------ No results for input(s): VITAMINB12, FOLATE, FERRITIN, TIBC, IRON, RETICCTPCT in the last 72 hours.  Coagulation profile No results for input(s): INR, PROTIME in the last 168 hours.  No results for input(s): DDIMER in the last 72 hours.  Cardiac Enzymes  Recent Labs Lab 05/06/16 0855 05/06/16 1415 05/06/16 2252  TROPONINI 0.03* 0.03* <0.03   ------------------------------------------------------------------------------------------------------------------    Component Value Date/Time   BNP 2,932.4 (H) 05/06/2016 0620    Inpatient Medications  Scheduled Meds: . aspirin EC  81 mg Oral Daily  . carvedilol  12.5 mg Oral BID WC  . enoxaparin (LOVENOX) injection  55 mg Subcutaneous Q24H  . escitalopram  20 mg Oral Daily  . furosemide  60 mg Intravenous BID  . hydrALAZINE  25 mg Oral Q8H  . insulin aspart  0-5 Units Subcutaneous QHS  . insulin aspart  0-9 Units Subcutaneous TID WC  . loratadine  10 mg Oral Daily  . potassium chloride SA  20  mEq Oral Daily  . pregabalin  75 mg Oral Daily  . sacubitril-valsartan  1 tablet Oral BID  . sodium chloride flush  3 mL Intravenous Q12H  . sodium chloride flush  3 mL Intravenous Q12H  . spironolactone  25 mg Oral Daily   Continuous Infusions: PRN Meds:.sodium chloride, acetaminophen **OR** acetaminophen, butalbital-acetaminophen-caffeine, diphenhydrAMINE, guaiFENesin-dextromethorphan, hydrALAZINE, hydrocortisone cream, nitroGLYCERIN, ondansetron, phenol, promethazine, sodium chloride flush  Micro Results Recent Results (from the past 240 hour(s))  MRSA PCR Screening     Status: None   Collection Time: 05/06/16  8:27 PM  Result Value Ref Range Status   MRSA by PCR NEGATIVE NEGATIVE Final    Comment:        The GeneXpert MRSA Assay (FDA approved for NASAL specimens only), is one component of a comprehensive MRSA colonization surveillance program. It is not intended to diagnose MRSA infection nor to guide or monitor treatment for MRSA infections.     Radiology Reports Dg Chest 2 View  Result Date: 05/06/2016 CLINICAL DATA:  Cough for 2 weeks, worsening yesterday. EXAM: CHEST  2 VIEW COMPARISON:  04/03/2016 FINDINGS: Moderate cardiomegaly with mild tortuosity of the thoracic aorta. Mild central airway thickening with indistinct linear opacities at the right lung base likely within the right lower lobe. The Mild thoracic spondylosis. No significant blunting of the costophrenic angles. IMPRESSION: 1. Moderate cardiomegaly. 2. Streaky opacities at the right lung base could represent atelectasis or early bronchopneumonia. 3. Airway thickening is present, suggesting bronchitis or reactive airways disease. Electronically Signed   By: Van Clines M.D.   On: 05/06/2016 07:23      [facility], Ebin Palazzi M.D on 05/09/2016 at 11:59 AM  Between 7am to 7pm - Pager - [phone]  After 7pm go to [email or web address] - password [facility]  [facility] -  Office  [phone]

## 2016-05-10 LAB — GLUCOSE, CAPILLARY
GLUCOSE-CAPILLARY: 266 mg/dL — AB (ref 65–99)
Glucose-Capillary: 197 mg/dL — ABNORMAL HIGH (ref 65–99)

## 2016-05-10 LAB — BASIC METABOLIC PANEL
ANION GAP: 6 (ref 5–15)
BUN: 21 mg/dL — ABNORMAL HIGH (ref 6–20)
CHLORIDE: 97 mmol/L — AB (ref 101–111)
CO2: 34 mmol/L — AB (ref 22–32)
Calcium: 9.2 mg/dL (ref 8.9–10.3)
Creatinine, Ser: 1.22 mg/dL — ABNORMAL HIGH (ref 0.44–1.00)
GFR calc non Af Amer: 55 mL/min — ABNORMAL LOW (ref >60)
Glucose, Bld: 239 mg/dL — ABNORMAL HIGH (ref 65–99)
POTASSIUM: 4 mmol/L (ref 3.5–5.1)
Sodium: 137 mmol/L (ref 135–145)

## 2016-05-10 MED ORDER — TORSEMIDE 20 MG PO TABS: ORAL_TABLET | ORAL | 0 refills | Status: DC

## 2016-05-10 MED ORDER — HYDRALAZINE HCL 50 MG PO TABS: 50.0000 mg | ORAL_TABLET | Freq: Three times a day (TID) | ORAL | 0 refills | Status: DC

## 2016-05-10 NOTE — Progress Notes (Signed)
All d/c instruction explained and given to pt.  Verbalized understanding.  At 1252 pt d/c off floor to awaiting transport after d/c instructions.  Karie Kirks, Therapist, sports.

## 2016-05-10 NOTE — Discharge Summary (Signed)
Chelsey Santiago, is a 40 y.o. female  DOB [birth year]  MRN [id number].  Admission date:  05/06/2016  Admitting Physician  Waldemar Dickens, MD  Discharge Date:  05/10/2016   Primary MD  Rosita Fire, MD  Recommendations for primary care physician for things to follow:  - Please check CBC, BMP during next visit.   Admission Diagnosis  Acute on chronic congestive heart failure, unspecified congestive heart failure type ([place]) [I50.9]   Discharge Diagnosis  Acute on chronic congestive heart failure, unspecified congestive heart failure type ([place]) [I50.9]    Active Problems:   Obesity   Resistant hypertension   Acute on chronic combined systolic and diastolic CHF, NYHA class 4 (HCC)   Depression   Diabetes mellitus with complication (HCC)   Leukocytosis   Neuropathy ([place])      Past Medical History:  Diagnosis Date  . Anemia    H&H of 10.6/33 and 07/2008 and 11.9/35 and 09/2010  . Anxiety   . CHF (congestive heart failure) ([place])   . Depression with anxiety   . Diabetes mellitus without complication ([place])   . Enlarged heart   . Fasting hyperglycemia   . Hypertension    Lab: Normal BMet except glucose of 118 in 09/2010  . Hypertensive heart disease 2009   Pulmonary edema postpartum; mild to moderate mitral regurgitation when hospitalized for CHF in 2009; Echocardiogram in 12/2009-no MR and normal EF; normal CXR in 09/2010  . Migraine headache   . Miscarriage 03/19/2013  . Obesity 04/16/2009  . Osteoarthritis, knee 03/29/2011  . Preeclampsia   . Pregnant   . Pulmonary edema   . Sleep apnea   . Threatened abortion in early pregnancy 03/15/2013    Past Surgical History:  Procedure Laterality Date  . BREAST REDUCTION SURGERY  2002  . CARDIAC CATHETERIZATION N/A 12/22/2015   Procedure: Left Heart Cath and Coronary Angiography;  Surgeon: Peter M Martinique, MD;  Location: [facility];  Service:  Cardiovascular;  Laterality: N/A;  . CESAREAN SECTION N/A 04/09/2014   Procedure: CESAREAN SECTION;  Surgeon: Mora Bellman, MD;  Location: [facility];  Service: Obstetrics;  Laterality: N/A;  . CHOLECYSTECTOMY         History of present illness and  Hospital Course:     Kindly see H&P for history of present illness and admission details, please review complete Labs, Consult reports and Test reports for all details in brief  HPI  from the history and physical done on the day of admission 05/06/2016  HPI: Chelsey Santiago is a 40 y.o. female with medical history significant of anemia, CHF, DM, CHF, migraines, HTN presenting w/ several day of his breath. Associated with orthopnea, nausea and vomiting which patient states are her typical symptoms for CHF exacerbation. Denies any productive cough, fevers, chest pain, palpitations, abdominal pain, dysuria, frequency, back pain, neck stiffness, headache, LOC. States she's been compliant with her home medical regimen including her home torsemide 20 mg daily. Patient do not attempt any increase dose of torsemide  as she oftentimes does along with leg elevation.  ED Course: Lasix 40 mg IV given with increased diuresis acheived  Hospital Course   40 y.o.femalewith medical history significant of anemia, CHF, DM, CHF, migraines, HTN presenting w/ several day of his breath, admitted for acute on chronic combined CHF  Acute on chronic combined diastolic and systolic congestive heart failure.  - BNP 2932, cardiomegaly on x-ray, troponin normal. Endorsing weight gain, lower extremity edema and orthopnea. Last Echo showing EF of 40% and grade 2 diastolic dysfunction. - Patient was admitted for IV diuresis, although all good diuresis with total of -7.6 L negative balance during hospital stay, educated about compliance with fluid restriction and low-salt diet , her torsemide dose was increased from 40 mg oral daily 40 mg oral daily and 20 mg every evening. -  Continue spironolactone, beta blocker, ACE inhibitor   Hypokalemia/hypomagnesemia - Repleted  Leukocytosis:  -  Suspect largely due to acute stress reaction. Resolved   CKD: Creatinine 1.18. Near baseline. - At baseline  Diabetes: - Resume home medication on discharge  Hypertension:  - continue Entresto, spironolactone,  Coreg, her BiDil has been stopped given significant headache, started on hydralazine 50 mg oral 3 times a day.  Depression: - Continue Lexapro  Neuropathy: - continue lyrica  Headache - Patient reports her headache after starting BiDil for last 6 month, reports it is significant, her BiDil has been discontinued, and has no recurrence of headache .   Discharge Condition:  stable   Follow UP  Follow-up Information    FANTA,TESFAYE, MD Follow up in 1 week(s).   Specialty:  Internal Medicine Contact information: [place] [address] [phone]             Discharge Instructions  and  Discharge Medication  Discharge Instructions    Discharge instructions    Complete by:  As directed    Follow with Primary MD FANTA,TESFAYE, MD in 7 days   Get CBC, CMP, 2 view Chest X ray checked  by Primary MD next visit.    Activity: As tolerated with Full fall precautions use walker/cane & assistance as needed   Disposition Home    Diet: Heart Healthy , low salt , carb modified, with feeding assistance and aspiration precautions.  For Heart failure patients - Check your Weight same time everyday, if you gain over 2 pounds, or you develop in leg swelling, experience more shortness of breath or chest pain, call your Primary MD immediately. Follow Cardiac Low Salt Diet and 1.5 lit/day fluid restriction.   On your next visit with your primary care physician please Get Medicines reviewed and adjusted.   Please request your Prim.MD to go over all Hospital Tests and Procedure/Radiological results at the follow up, please  get all Hospital records sent to your Prim MD by signing hospital release before you go home.   If you experience worsening of your admission symptoms, develop shortness of breath, life threatening emergency, suicidal or homicidal thoughts you must seek medical attention immediately by calling 911 or calling your MD immediately  if symptoms less severe.  You Must read complete instructions/literature along with all the possible adverse reactions/side effects for all the Medicines you take and that have been prescribed to you. Take any new Medicines after you have completely understood and accpet all the possible adverse reactions/side effects.   Do not drive, operating heavy machinery, perform activities at heights, swimming or participation in water activities or provide baby sitting  services if your were admitted for syncope or siezures until you have seen by Primary MD or a Neurologist and advised to do so again.  Do not drive when taking Pain medications.    Do not take more than prescribed Pain, Sleep and Anxiety Medications  Special Instructions: If you have smoked or chewed Tobacco  in the last 2 yrs please stop smoking, stop any regular Alcohol  and or any Recreational drug use.  Wear Seat belts while driving.   Please note  You were cared for by a hospitalist during your hospital stay. If you have any questions about your discharge medications or the care you received while you were in the hospital after you are discharged, you can call the unit and asked to speak with the hospitalist on call if the hospitalist that took care of you is not available. Once you are discharged, your primary care physician will handle any further medical issues. Please note that NO REFILLS for any discharge medications will be authorized once you are discharged, as it is imperative that you return to your primary care physician (or establish a relationship with a primary care physician if you do not have  one) for your aftercare needs so that they can reassess your need for medications and monitor your lab values.   Increase activity slowly    Complete by:  As directed      Allergies as of 05/10/2016      Reactions   Diclofenac Swelling   AND POSSIBLE SYNCOPE   Tramadol Nausea And Vomiting   Itching (12/21)   Vicodin [hydrocodone-acetaminophen] Itching, Nausea Only      Medication List    STOP taking these medications   isosorbide-hydrALAZINE 20-37.5 MG tablet Commonly known as:  BIDIL     TAKE these medications   aspirin EC 81 MG tablet Take 81 mg by mouth daily.   carvedilol 12.5 MG tablet Commonly known as:  COREG Take 1 tablet (12.5 mg total) by mouth 2 (two) times daily with a meal.   escitalopram 20 MG tablet Commonly known as:  LEXAPRO Take 1 tablet (20 mg total) by mouth daily.   freestyle lancets Use as instructed   hydrALAZINE 50 MG tablet Commonly known as:  APRESOLINE Take 1 tablet (50 mg total) by mouth 3 (three) times daily.   metFORMIN 500 MG tablet Commonly known as:  GLUCOPHAGE Take 1 tablet (500 mg total) by mouth 2 (two) times daily with a meal.   nitroGLYCERIN 0.4 MG SL tablet Commonly known as:  NITROSTAT Place 1 tablet (0.4 mg total) under the tongue every 5 (five) minutes as needed for chest pain.   ondansetron 4 MG tablet Commonly known as:  ZOFRAN Take 1 tablet (4 mg total) by mouth every 8 (eight) hours as needed for nausea.   potassium chloride SA 20 MEQ tablet Commonly known as:  K-DUR,KLOR-CON Take 1 tablet (20 mEq total) by mouth daily.   pregabalin 75 MG capsule Commonly known as:  LYRICA Take 75 mg by mouth daily.   sacubitril-valsartan 97-103 MG Commonly known as:  ENTRESTO Take 1 tablet by mouth 2 (two) times daily.   sitaGLIPtin 100 MG tablet Commonly known as:  JANUVIA Take 100 mg by mouth daily.   torsemide 20 MG tablet Commonly known as:  DEMADEX Please take 40 mg oral every morning and 20 mg every  evening. What changed:  how much to take  how to take this  when to take this  additional  instructions         Diet and Activity recommendation: See Discharge Instructions above   Consults obtained - None   Major procedures and Radiology Reports - PLEASE review detailed and final reports for all details, in brief -      Dg Chest 2 View  Result Date: 05/06/2016 CLINICAL DATA:  Cough for 2 weeks, worsening yesterday. EXAM: CHEST  2 VIEW COMPARISON:  04/03/2016 FINDINGS: Moderate cardiomegaly with mild tortuosity of the thoracic aorta. Mild central airway thickening with indistinct linear opacities at the right lung base likely within the right lower lobe. The Mild thoracic spondylosis. No significant blunting of the costophrenic angles. IMPRESSION: 1. Moderate cardiomegaly. 2. Streaky opacities at the right lung base could represent atelectasis or early bronchopneumonia. 3. Airway thickening is present, suggesting bronchitis or reactive airways disease. Electronically Signed   By: Van Clines M.D.   On: 05/06/2016 07:23    Micro Results    Recent Results (from the past 240 hour(s))  MRSA PCR Screening     Status: None   Collection Time: 05/06/16  8:27 PM  Result Value Ref Range Status   MRSA by PCR NEGATIVE NEGATIVE Final    Comment:        The GeneXpert MRSA Assay (FDA approved for NASAL specimens only), is one component of a comprehensive MRSA colonization surveillance program. It is not intended to diagnose MRSA infection nor to guide or monitor treatment for MRSA infections.        Today   Subjective:   Shaida Kittelson today has no headache,no chest abdominal pain,no new weakness tingling or numbness, feels much better wants to go home today.   Objective:   Blood pressure 138/84, pulse 98, temperature 98.5 F (36.9 C), temperature source Oral, resp. rate 20, height 5\' 6"  (1.676 m), weight 106.7 kg (235 lb 3.2 oz), SpO2 98  %.   Intake/Output Summary (Last 24 hours) at 05/10/16 1057 Last data filed at 05/10/16 [id number]  Gross per 24 hour  Intake             1140 ml  Output             2251 ml  Net            -1111 ml    Exam Awake Alert, Oriented x 3, No new F.N deficits, Normal affect [place].AT,PERRAL Supple Neck,No JVD, No cervical lymphadenopathy appriciated.  Symmetrical Chest wall movement, Good air movement bilaterally, CTAB RRR,No Gallops,Rubs or new Murmurs, No Parasternal Heave +ve B.Sounds, Abd Soft, Non tender, No organomegaly appriciated, No rebound -guarding or rigidity. No Cyanosis, Clubbing or edema, No new Rash or bruise  Data Review   CBC w Diff: Lab Results  Component Value Date   WBC 8.6 05/07/2016   HGB 9.8 (L) 05/07/2016   HCT 33.4 (L) 05/07/2016   PLT 245 05/07/2016   LYMPHOPCT 23 04/03/2016   BANDSPCT 0 12/19/2015   MONOPCT 5 04/03/2016   EOSPCT 1 04/03/2016   BASOPCT 0 04/03/2016    CMP: Lab Results  Component Value Date   NA 137 05/10/2016   K 4.0 05/10/2016   CL 97 (L) 05/10/2016   CO2 34 (H) 05/10/2016   BUN 21 (H) 05/10/2016   CREATININE 1.22 (H) 05/10/2016   CREATININE 1.00 03/11/2016   PROT 6.6 04/03/2016   ALBUMIN 2.9 (L) 04/03/2016   BILITOT 0.4 04/03/2016   ALKPHOS 78 04/03/2016   AST 56 (H) 04/03/2016   ALT 57 (H) 04/03/2016  .  Total Time in preparing paper work, data evaluation and todays exam - 35 minutes  Miu Chiong M.D on 05/10/2016 at 10:57 AM  [facility]   Office  [phone]

## 2016-05-10 NOTE — Discharge Instructions (Signed)
Follow with Primary MD FANTA,TESFAYE, MD in 7 days   Get CBC, CMP, 2 view Chest X ray checked  by Primary MD next visit.    Activity: As tolerated with Full fall precautions use walker/cane & assistance as needed   Disposition Home    Diet: Heart Healthy , low salt , carb modified, with feeding assistance and aspiration precautions.  For Heart failure patients - Check your Weight same time everyday, if you gain over 2 pounds, or you develop in leg swelling, experience more shortness of breath or chest pain, call your Primary MD immediately. Follow Cardiac Low Salt Diet and 1.5 lit/day fluid restriction.   On your next visit with your primary care physician please Get Medicines reviewed and adjusted.   Please request your Prim.MD to go over all Hospital Tests and Procedure/Radiological results at the follow up, please get all Hospital records sent to your Prim MD by signing hospital release before you go home.   If you experience worsening of your admission symptoms, develop shortness of breath, life threatening emergency, suicidal or homicidal thoughts you must seek medical attention immediately by calling 911 or calling your MD immediately  if symptoms less severe.  You Must read complete instructions/literature along with all the possible adverse reactions/side effects for all the Medicines you take and that have been prescribed to you. Take any new Medicines after you have completely understood and accpet all the possible adverse reactions/side effects.   Do not drive, operating heavy machinery, perform activities at heights, swimming or participation in water activities or provide baby sitting services if your were admitted for syncope or siezures until you have seen by Primary MD or a Neurologist and advised to do so again.  Do not drive when taking Pain medications.    Do not take more than prescribed Pain, Sleep and Anxiety Medications  Special Instructions: If you have smoked  or chewed Tobacco  in the last 2 yrs please stop smoking, stop any regular Alcohol  and or any Recreational drug use.  Wear Seat belts while driving.   Please note  You were cared for by a hospitalist during your hospital stay. If you have any questions about your discharge medications or the care you received while you were in the hospital after you are discharged, you can call the unit and asked to speak with the hospitalist on call if the hospitalist that took care of you is not available. Once you are discharged, your primary care physician will handle any further medical issues. Please note that NO REFILLS for any discharge medications will be authorized once you are discharged, as it is imperative that you return to your primary care physician (or establish a relationship with a primary care physician if you do not have one) for your aftercare needs so that they can reassess your need for medications and monitor your lab values.

## 2016-05-12 LAB — GLUCOSE, CAPILLARY: GLUCOSE-CAPILLARY: 199 mg/dL — AB (ref 65–99)

## 2016-05-14 ENCOUNTER — Ambulatory Visit: Payer: Medicaid Other | Admitting: Adult Health

## 2016-05-18 ENCOUNTER — Encounter (HOSPITAL_COMMUNITY): Admission: RE | Admit: 2016-05-18 | Payer: Medicaid Other | Source: Ambulatory Visit

## 2016-08-09 DIAGNOSIS — Z029 Encounter for administrative examinations, unspecified: Secondary | ICD-10-CM

## 2016-08-24 IMAGING — CT CT MAXILLOFACIAL W/ CM
1 series · 15 of 30 positions shown, 19 images · IV contrast (iopamidol)
Comparison: None.

CLINICAL DATA: Left facial swelling.  Pain.

EXAM:
CT MAXILLOFACIAL WITH CONTRAST
TECHNIQUE: Multidetector CT imaging of the maxillofacial structures was
performed with intravenous contrast. Multiplanar CT image
reconstructions were also generated. A small metallic BB was placed
on the right temple in order to reliably differentiate right from
left.
CONTRAST:  75mL M4CQ81-2LL IOPAMIDOL (M4CQ81-2LL) INJECTION 61%

[Series 3: max soft · axial · 0.42mm/px · z∈[+968,+1166]mm · 15 of 107 slices shown, 19 images]
[im 4/107  brain]
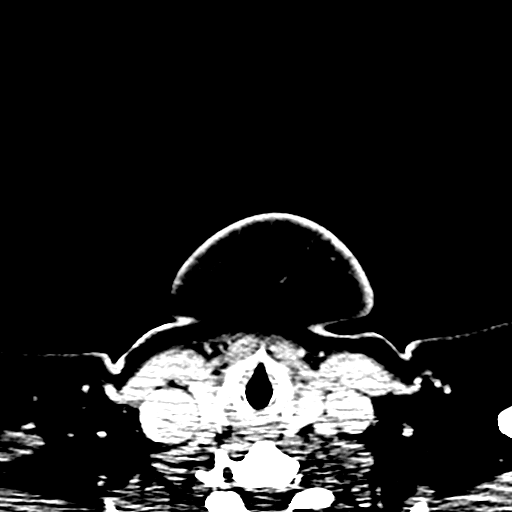
[im 4/107  bone]
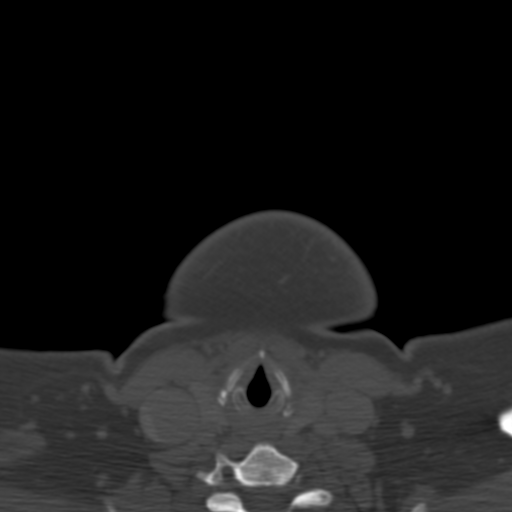
[im 11/107  bone]
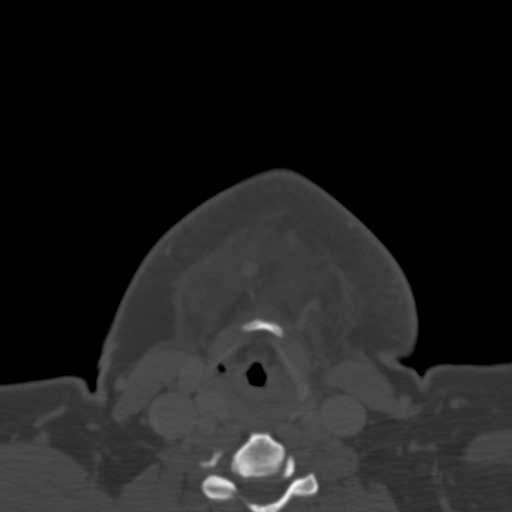
[im 19/107  bone]
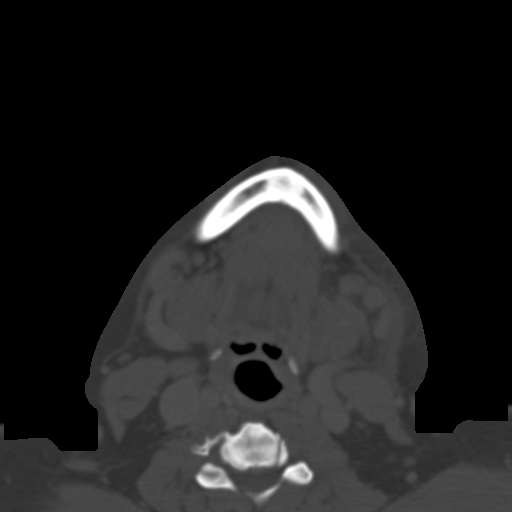
[im 26/107  bone]
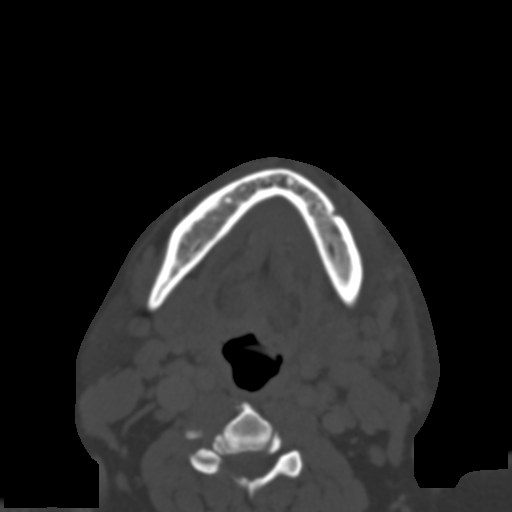
[im 33/107  brain]
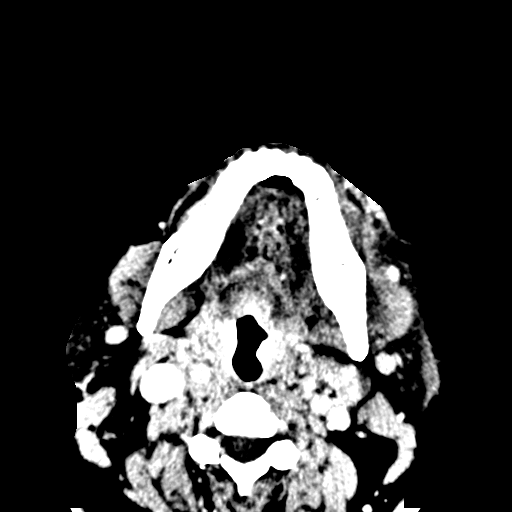
[im 33/107  bone]
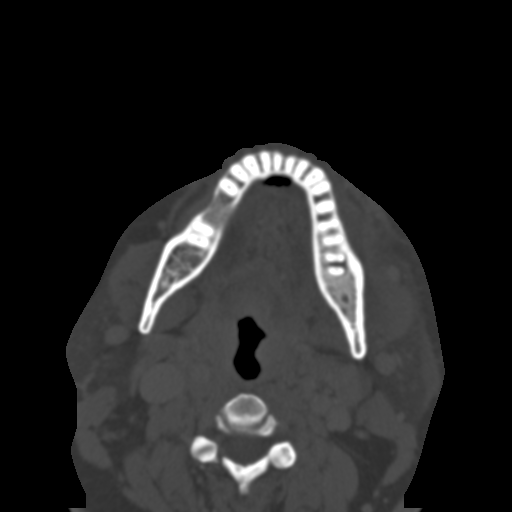
[im 41/107  bone]
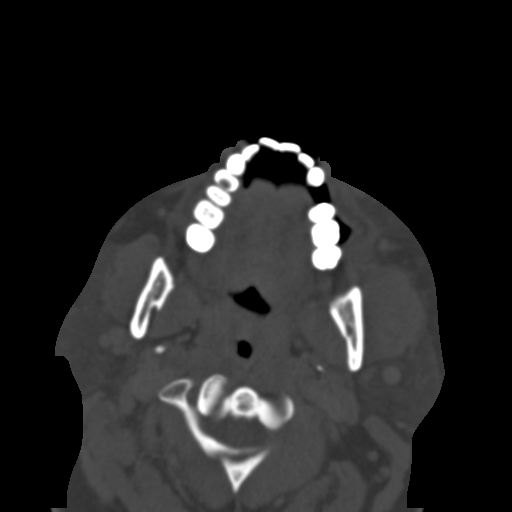
[im 48/107  bone]
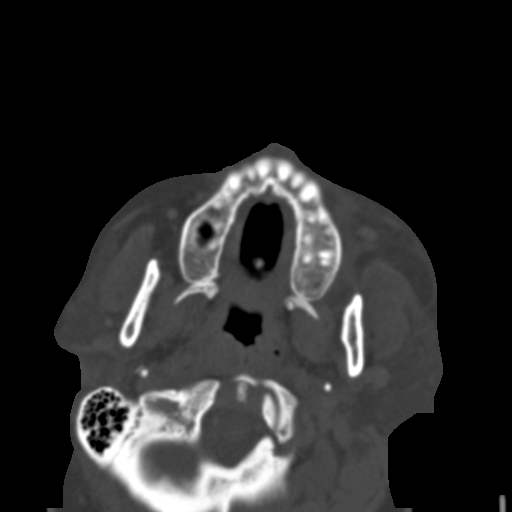
[im 55/107  bone]
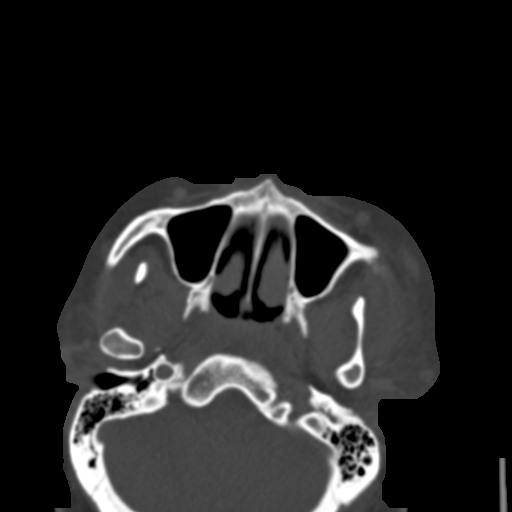
[im 59/107  brain]
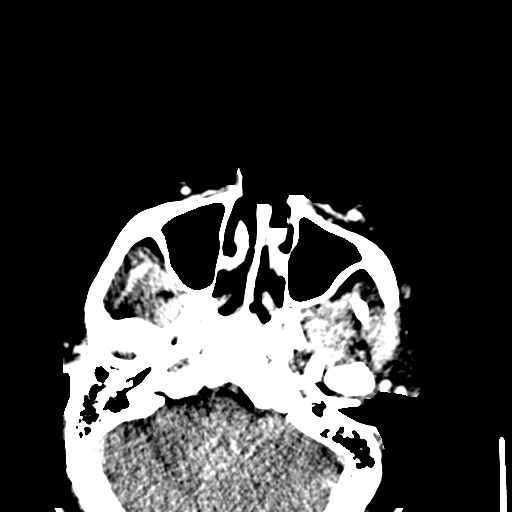
[im 59/107  bone]
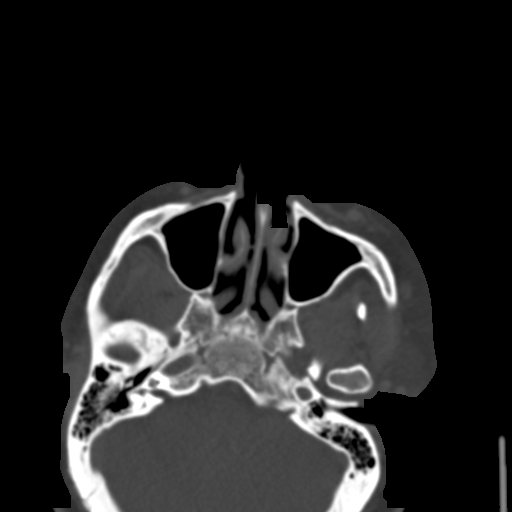
[im 66/107  bone]
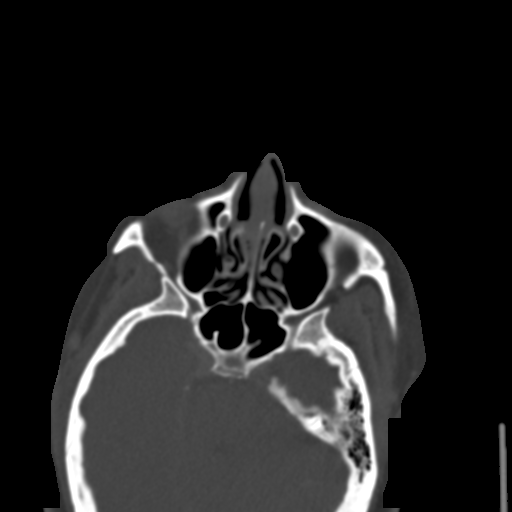
[im 74/107  bone]
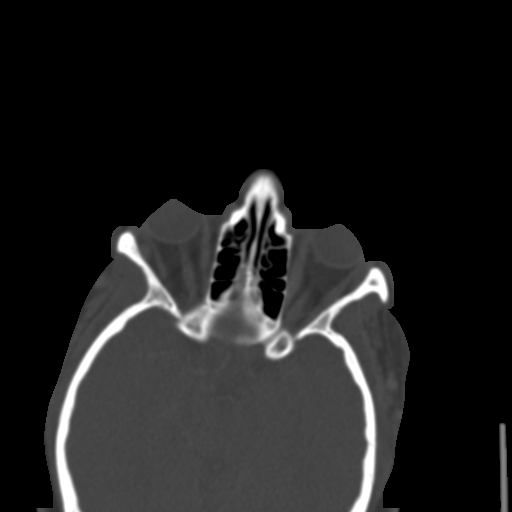
[im 81/107  bone]
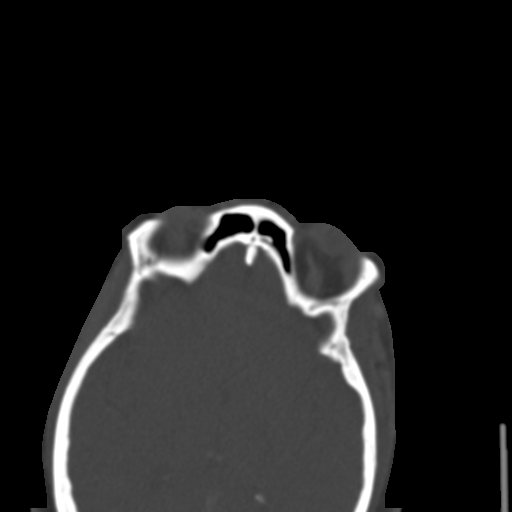
[im 88/107  brain]
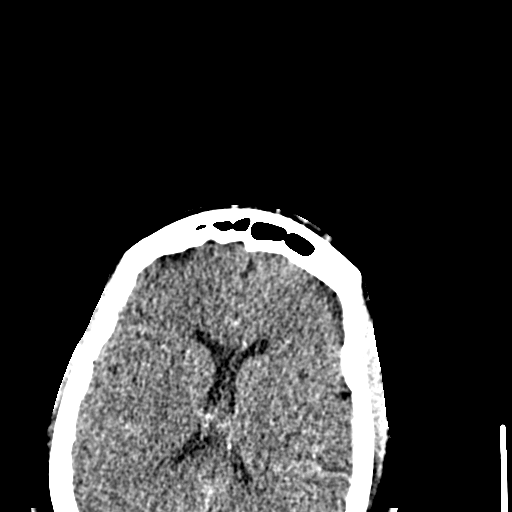
[im 88/107  bone]
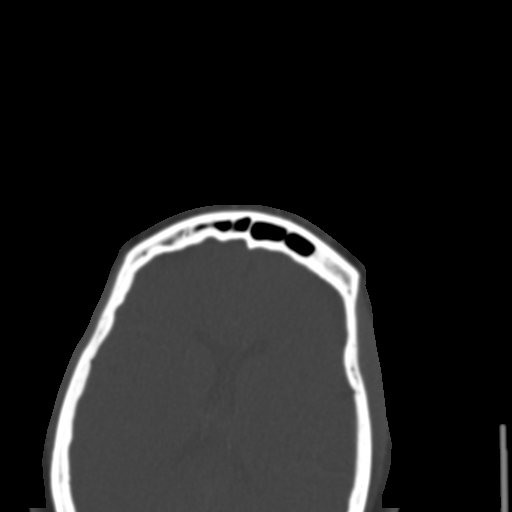
[im 96/107  bone]
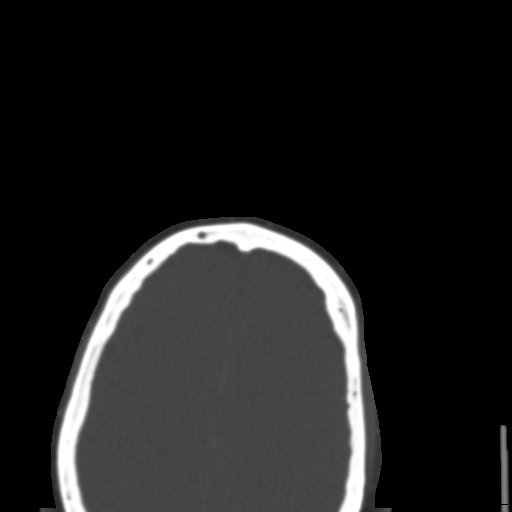
[im 103/107  bone]
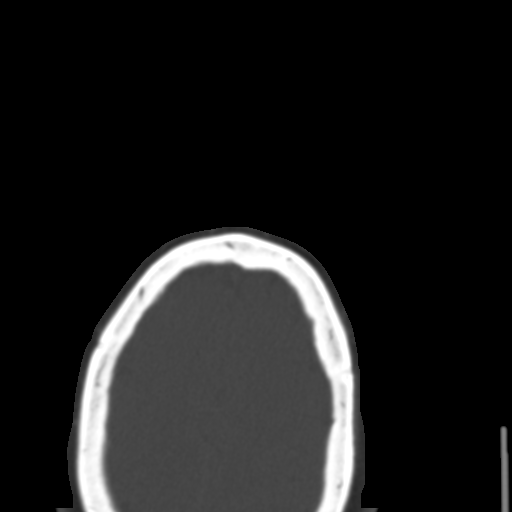

[15 of 30 positions shown; findings below may reference images not displayed]

FINDINGS: There is edema in the subcutaneous fat with skin thickening along
the left side of the face. There is soft tissue edema adjacent to
the left side of the mandible consistent with edema and
inflammation. There is no drainable fluid collection to suggest an
abscess. There is periapical lucency involving the third left
mandibular molar concerning for infection. The globes are intact.
The orbital walls are intact. The orbital floors are intact. The
maxilla is intact. The mandible is intact. The zygomatic arches are
intact. The nasal septum is midline. There is no nasal bone
fracture. The temporomandibular joints are normal.

The paranasal sinuses are clear. The visualized portions of the
mastoid sinuses are well aerated.
IMPRESSION: 1. Findings concerning for left facial cellulitis. Mild edema and
expansion of the left masticator muscle likely reflecting mild
myositis. No drainable fluid collection at this time to suggest an
abscess. Periapical lucency involving the left third mandibular
molar concerning for infection.

## 2016-08-28 ENCOUNTER — Encounter (HOSPITAL_COMMUNITY): Payer: Self-pay

## 2016-08-28 ENCOUNTER — Observation Stay (HOSPITAL_COMMUNITY)
Admission: EM | Admit: 2016-08-28 | Discharge: 2016-08-29 | Disposition: A | Discharge status: Home / Self Care | Payer: Medicaid Other | Attending: Internal Medicine | Admitting: Internal Medicine

## 2016-08-28 ENCOUNTER — Emergency Department (HOSPITAL_COMMUNITY): Payer: Medicaid Other

## 2016-08-28 DIAGNOSIS — I160 Hypertensive urgency: Principal | ICD-10-CM | POA: Diagnosis present

## 2016-08-28 DIAGNOSIS — E66812 Obesity, class 2: Secondary | ICD-10-CM | POA: Diagnosis present

## 2016-08-28 DIAGNOSIS — E119 Type 2 diabetes mellitus without complications: Secondary | ICD-10-CM | POA: Diagnosis not present

## 2016-08-28 DIAGNOSIS — E118 Type 2 diabetes mellitus with unspecified complications: Secondary | ICD-10-CM | POA: Diagnosis present

## 2016-08-28 DIAGNOSIS — I5023 Acute on chronic systolic (congestive) heart failure: Secondary | ICD-10-CM

## 2016-08-28 DIAGNOSIS — Z79899 Other long term (current) drug therapy: Secondary | ICD-10-CM | POA: Diagnosis not present

## 2016-08-28 DIAGNOSIS — M5412 Radiculopathy, cervical region: Secondary | ICD-10-CM | POA: Insufficient documentation

## 2016-08-28 DIAGNOSIS — I119 Hypertensive heart disease without heart failure: Secondary | ICD-10-CM | POA: Diagnosis present

## 2016-08-28 DIAGNOSIS — Z794 Long term (current) use of insulin: Secondary | ICD-10-CM | POA: Insufficient documentation

## 2016-08-28 DIAGNOSIS — I110 Hypertensive heart disease with heart failure: Secondary | ICD-10-CM | POA: Diagnosis not present

## 2016-08-28 DIAGNOSIS — Z0389 Encounter for observation for other suspected diseases and conditions ruled out: Secondary | ICD-10-CM

## 2016-08-28 DIAGNOSIS — I43 Cardiomyopathy in diseases classified elsewhere: Secondary | ICD-10-CM | POA: Diagnosis present

## 2016-08-28 DIAGNOSIS — Z7982 Long term (current) use of aspirin: Secondary | ICD-10-CM | POA: Insufficient documentation

## 2016-08-28 DIAGNOSIS — I5022 Chronic systolic (congestive) heart failure: Secondary | ICD-10-CM | POA: Diagnosis not present

## 2016-08-28 DIAGNOSIS — R0602 Shortness of breath: Secondary | ICD-10-CM | POA: Diagnosis present

## 2016-08-28 DIAGNOSIS — IMO0001 Reserved for inherently not codable concepts without codable children: Secondary | ICD-10-CM

## 2016-08-28 DIAGNOSIS — G4733 Obstructive sleep apnea (adult) (pediatric): Secondary | ICD-10-CM | POA: Diagnosis present

## 2016-08-28 DIAGNOSIS — Z9989 Dependence on other enabling machines and devices: Secondary | ICD-10-CM

## 2016-08-28 LAB — CBC WITH DIFFERENTIAL/PLATELET
Basophils Absolute: 0 10*3/uL (ref 0.0–0.1)
Basophils Relative: 0 %
EOS PCT: 1 %
Eosinophils Absolute: 0.1 10*3/uL (ref 0.0–0.7)
HCT: 32 % — ABNORMAL LOW (ref 36.0–46.0)
Hemoglobin: 10 g/dL — ABNORMAL LOW (ref 12.0–15.0)
LYMPHS ABS: 3.4 10*3/uL (ref 0.7–4.0)
LYMPHS PCT: 30 %
MCH: 23.2 pg — AB (ref 26.0–34.0)
MCHC: 31.3 g/dL (ref 30.0–36.0)
MCV: 74.2 fL — AB (ref 78.0–100.0)
MONO ABS: 0.5 10*3/uL (ref 0.1–1.0)
MONOS PCT: 4 %
Neutro Abs: 7.4 10*3/uL (ref 1.7–7.7)
Neutrophils Relative %: 65 %
PLATELETS: 305 10*3/uL (ref 150–400)
RBC: 4.31 MIL/uL (ref 3.87–5.11)
RDW: 14 % (ref 11.5–15.5)
WBC: 11.3 10*3/uL — ABNORMAL HIGH (ref 4.0–10.5)

## 2016-08-28 LAB — BASIC METABOLIC PANEL
Anion gap: 8 (ref 5–15)
BUN: 20 mg/dL (ref 6–20)
CALCIUM: 8.7 mg/dL — AB (ref 8.9–10.3)
CO2: 27 mmol/L (ref 22–32)
CREATININE: 1.1 mg/dL — AB (ref 0.44–1.00)
Chloride: 101 mmol/L (ref 101–111)
GFR calc Af Amer: >60 mL/min (ref >60)
GLUCOSE: 241 mg/dL — AB (ref 65–99)
Potassium: 3.1 mmol/L — ABNORMAL LOW (ref 3.5–5.1)
Sodium: 136 mmol/L (ref 135–145)

## 2016-08-28 LAB — GLUCOSE, CAPILLARY
GLUCOSE-CAPILLARY: 221 mg/dL — AB (ref 65–99)
GLUCOSE-CAPILLARY: 246 mg/dL — AB (ref 65–99)
Glucose-Capillary: 249 mg/dL — ABNORMAL HIGH (ref 65–99)

## 2016-08-28 LAB — TROPONIN I
Troponin I: <0.03 ng/mL (ref <0.03)
Troponin I: <0.03 ng/mL (ref <0.03)

## 2016-08-28 LAB — TSH: TSH: 1.434 u[IU]/mL (ref 0.350–4.500)

## 2016-08-28 LAB — BRAIN NATRIURETIC PEPTIDE: B Natriuretic Peptide: 725 pg/mL — ABNORMAL HIGH (ref 0.0–100.0)

## 2016-08-28 MED ORDER — SODIUM CHLORIDE 0.9% FLUSH: 3.0000 mL | INTRAVENOUS | Status: DC | PRN

## 2016-08-28 MED ORDER — FUROSEMIDE 10 MG/ML IJ SOLN
40.0000 mg | Freq: Every day | INTRAMUSCULAR | Status: DC
  Administered 2016-08-28 – 2016-08-29 (×2): 40 mg via INTRAVENOUS
  Filled 2016-08-28 (×2): qty 4

## 2016-08-28 MED ORDER — HYDRALAZINE HCL 20 MG/ML IJ SOLN
10.0000 mg | Freq: Once | INTRAMUSCULAR | Status: AC
  Administered 2016-08-28: 10 mg via INTRAVENOUS
  Filled 2016-08-28: qty 1

## 2016-08-28 MED ORDER — HYDRALAZINE HCL 25 MG PO TABS
50.0000 mg | ORAL_TABLET | Freq: Four times a day (QID) | ORAL | Status: DC
  Administered 2016-08-28 – 2016-08-29 (×4): 50 mg via ORAL
  Filled 2016-08-28 (×4): qty 2

## 2016-08-28 MED ORDER — POTASSIUM CHLORIDE CRYS ER 20 MEQ PO TBCR
20.0000 meq | EXTENDED_RELEASE_TABLET | Freq: Every day | ORAL | Status: DC
Start: 2016-08-28 — End: 2016-08-29
  Administered 2016-08-28 – 2016-08-29 (×2): 20 meq via ORAL
  Filled 2016-08-28 (×2): qty 1

## 2016-08-28 MED ORDER — NITROGLYCERIN 2 % TD OINT
1.0000 [in_us] | TOPICAL_OINTMENT | Freq: Once | TRANSDERMAL | Status: AC
  Administered 2016-08-28: 1 [in_us] via TOPICAL
  Filled 2016-08-28: qty 1

## 2016-08-28 MED ORDER — ASPIRIN 81 MG PO CHEW
324.0000 mg | CHEWABLE_TABLET | Freq: Once | ORAL | Status: AC
Start: 2016-08-28 — End: 2016-08-28
  Administered 2016-08-28: 324 mg via ORAL
  Filled 2016-08-28: qty 4

## 2016-08-28 MED ORDER — IBUPROFEN 100 MG/5ML PO SUSP
600.0000 mg | Freq: Three times a day (TID) | ORAL | Status: DC | PRN
  Administered 2016-08-28 – 2016-08-29 (×2): 600 mg via ORAL
  Filled 2016-08-28 (×2): qty 30

## 2016-08-28 MED ORDER — CARVEDILOL 12.5 MG PO TABS
25.0000 mg | ORAL_TABLET | Freq: Two times a day (BID) | ORAL | Status: DC
  Administered 2016-08-28 – 2016-08-29 (×3): 25 mg via ORAL
  Filled 2016-08-28 (×3): qty 2

## 2016-08-28 MED ORDER — GABAPENTIN 300 MG PO CAPS
300.0000 mg | ORAL_CAPSULE | Freq: Every day | ORAL | Status: DC
  Administered 2016-08-29: 300 mg via ORAL
  Filled 2016-08-28: qty 1

## 2016-08-28 MED ORDER — HYDRALAZINE HCL 25 MG PO TABS: 50.0000 mg | ORAL_TABLET | Freq: Three times a day (TID) | ORAL | Status: DC

## 2016-08-28 MED ORDER — ASPIRIN EC 81 MG PO TBEC
81.0000 mg | DELAYED_RELEASE_TABLET | Freq: Every day | ORAL | Status: DC
  Administered 2016-08-29: 81 mg via ORAL
  Filled 2016-08-28 (×2): qty 1

## 2016-08-28 MED ORDER — INSULIN ASPART 100 UNIT/ML ~~LOC~~ SOLN
0.0000 [IU] | Freq: Three times a day (TID) | SUBCUTANEOUS | Status: DC
  Administered 2016-08-28 (×2): 3 [IU] via SUBCUTANEOUS
  Administered 2016-08-29: 5 [IU] via SUBCUTANEOUS

## 2016-08-28 MED ORDER — FUROSEMIDE 10 MG/ML IJ SOLN
60.0000 mg | Freq: Once | INTRAMUSCULAR | Status: AC
  Administered 2016-08-28: 60 mg via INTRAVENOUS
  Filled 2016-08-28: qty 6

## 2016-08-28 MED ORDER — ACETAMINOPHEN 325 MG PO TABS
650.0000 mg | ORAL_TABLET | Freq: Four times a day (QID) | ORAL | Status: DC | PRN
  Administered 2016-08-28: 650 mg via ORAL
  Filled 2016-08-28: qty 2

## 2016-08-28 MED ORDER — HYDRALAZINE HCL 20 MG/ML IJ SOLN: 10.0000 mg | INTRAMUSCULAR | Status: DC | PRN

## 2016-08-28 MED ORDER — TORSEMIDE 20 MG PO TABS
40.0000 mg | ORAL_TABLET | Freq: Two times a day (BID) | ORAL | Status: DC
  Administered 2016-08-28 – 2016-08-29 (×3): 40 mg via ORAL
  Filled 2016-08-28 (×3): qty 2

## 2016-08-28 MED ORDER — SODIUM CHLORIDE 0.9% FLUSH
3.0000 mL | Freq: Two times a day (BID) | INTRAVENOUS | Status: DC
  Administered 2016-08-28 – 2016-08-29 (×2): 3 mL via INTRAVENOUS

## 2016-08-28 MED ORDER — ONDANSETRON HCL 4 MG/2ML IJ SOLN
4.0000 mg | Freq: Four times a day (QID) | INTRAMUSCULAR | Status: DC | PRN
  Administered 2016-08-28 – 2016-08-29 (×2): 4 mg via INTRAVENOUS
  Filled 2016-08-28 (×2): qty 2

## 2016-08-28 MED ORDER — SODIUM CHLORIDE 0.9 % IV SOLN: 250.0000 mL | INTRAVENOUS | Status: DC | PRN

## 2016-08-28 NOTE — ED Provider Notes (Signed)
[facility] DEPT Provider Note   CSN: [id number] Arrival date & time: 08/28/16  0252     History   Chief Complaint Chief Complaint  Patient presents with  . Shortness of Breath    HPI Chelsey Santiago is a 41 y.o. female.  Patient presents with tingling and pain in her left arm has been intermittent for the past 3 weeks of constant for the past day. States it became worse after someone [place] yesterday. Describes pain and tingling that radiates from her left neck: Without her left arm. Denies any weakness in her arm. Denies any difficulty speaking or swallowing. She became concerned today when she was at [facility] and developed shortness of breath with exertion associated with diaphoresis and nausea. This reminded her of when she had a heart attack. States she does not have stents and had a catheterization last year that showed patent coronaries. States she's been compliant with her fluid restriction as well as her blood pressure medication. Has had nausea but no vomiting. No diarrhea. No abdominal pain. No fever.   The history is provided by the patient.  Shortness of Breath  Associated symptoms include leg swelling. Pertinent negatives include no headaches, no cough, no chest pain, no vomiting and no abdominal pain.    Past Medical History:  Diagnosis Date  . Anemia    H&H of 10.6/33 and 07/2008 and 11.9/35 and 09/2010  . Anxiety   . CHF (congestive heart failure) ([place])   . Depression with anxiety   . Diabetes mellitus without complication ([place])   . Enlarged heart   . Fasting hyperglycemia   . Hypertension    Lab: Normal BMet except glucose of 118 in 09/2010  . Hypertensive heart disease 2009   Pulmonary edema postpartum; mild to moderate mitral regurgitation when hospitalized for CHF in 2009; Echocardiogram in 12/2009-no MR and normal EF; normal CXR in 09/2010  . Migraine headache   . Miscarriage 03/19/2013  . Obesity 04/16/2009  . Osteoarthritis, knee 03/29/2011  .  Preeclampsia   . Pregnant   . Pulmonary edema   . Sleep apnea   . Threatened abortion in early pregnancy 03/15/2013    Patient Active Problem List   Diagnosis Date Noted  . CHF (congestive heart failure) ([place]) 05/06/2016  . Diabetes mellitus with complication ([place]) 88/41/6606  . Leukocytosis 05/06/2016  . Neuropathy 05/06/2016  . Depression 04/16/2016  . Chronic tension-type headache, not intractable 04/16/2016  . AKI (acute kidney injury) ([place])   . Hyperkalemia   . Nonischemic cardiomyopathy ([place])   . Acute on chronic combined systolic and diastolic ACC/AHA stage C congestive heart failure ([place]) 04/03/2016  . Acute on chronic combined systolic and diastolic CHF, NYHA class 4 ([place]) 04/03/2016  . Hypertensive emergency 04/03/2016  . Cardiomyopathy due to hypertension ([place]) 12/22/2015  . Normal coronary arteries 12/22/2015  . Troponin level elevated 12/22/2015  . NSTEMI (non-ST elevated myocardial infarction) ([place]) 12/20/2015  . Dental infection 10/10/2015  . Chest pain 09/05/2015  . Systolic CHF, chronic ([place]) 09/05/2015  . LLQ pain   . Type 2 diabetes mellitus without complication, without long-term current use of insulin ([place]) 04/28/2014  . Essential hypertension   . 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 Hypertensive urgency 04/22/2014  . Acute CHF ([place]) 04/22/2014  . S/P cesarean section 04/11/2014  . Acute pulmonary edema ([place]) 04/11/2014  . Postoperative anemia 04/11/2014  . Elevated serum creatinine 04/11/2014  . Preeclampsia, severe 04/09/2014  . Pre-eclampsia superimposed on chronic hypertension, antepartum 04/08/2014  .  Dyspnea 04/08/2014  . Polyhydramnios in third trimester, antepartum 03/14/2014  . Abnormal maternal glucose tolerance, antepartum 03/11/2014  . High-risk pregnancy 03/11/2014  . Chronic hypertension in pregnancy 03/11/2014  . Impaired glucose tolerance during pregnancy, antepartum 11/27/2013  . Fibroids 11/22/2013  . History of gestational  diabetes in prior pregnancy, currently pregnant in first trimester 11/22/2013  . Hx of preeclampsia, prior pregnancy, currently pregnant 11/22/2013  . Short interval between pregnancies affecting pregnancy, antepartum 11/22/2013  . Supervision of high-risk pregnancy of elderly primigravida (>= 34 years old at delivery), third trimester 11/22/2013  . Miscarriage 03/19/2013  . 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 Hypertension   . Hypertensive cardiovascular disease   . Microcytic anemia   . Osteoarthritis, knee 03/29/2011  . Sleep apnea 12/09/2009  . Obesity 04/16/2009    Past Surgical History:  Procedure Laterality Date  . BREAST REDUCTION SURGERY  2002  . CARDIAC CATHETERIZATION N/A 12/22/2015   Procedure: Left Heart Cath and Coronary Angiography;  Surgeon: Peter M Martinique, MD;  Location: [facility];  Service: Cardiovascular;  Laterality: N/A;  . CESAREAN SECTION N/A 04/09/2014   Procedure: CESAREAN SECTION;  Surgeon: Mora Bellman, MD;  Location: [facility];  Service: Obstetrics;  Laterality: N/A;  . CHOLECYSTECTOMY      OB History    Gravida Para Term Preterm AB Living   11 6 5 1 5 6    SAB TAB Ectopic Multiple Live Births   3 2   0 6       Home Medications    Prior to Admission medications   Medication Sig Start Date End Date Taking? Authorizing Provider  aspirin EC 81 MG tablet Take 81 mg by mouth daily.   Yes Historical Provider, MD  carvedilol (COREG) 12.5 MG tablet Take 1 tablet (12.5 mg total) by mouth 2 (two) times daily with a meal. 04/09/16  Yes Mauricio Gerome Apley, MD  escitalopram (LEXAPRO) 20 MG tablet Take 1 tablet (20 mg total) by mouth daily. 03/26/16  Yes Estill Dooms, NP  gabapentin (NEURONTIN) 300 MG capsule Take 300 mg by mouth at bedtime.   Yes Historical Provider, MD  hydrALAZINE (APRESOLINE) 50 MG tablet Take 1 tablet (50 mg total) by mouth 3 (three) times daily. 05/10/16  Yes Silver Huguenin Elgergawy, MD  insulin glargine (LANTUS) 100 UNIT/ML injection  Inject 30 Units into the skin.   Yes Historical Provider, MD  Lancets (FREESTYLE) lancets Use as instructed 04/09/16  Yes Mauricio Gerome Apley, MD  nitroGLYCERIN (NITROSTAT) 0.4 MG SL tablet Place 1 tablet (0.4 mg total) under the tongue every 5 (five) minutes as needed for chest pain. 09/05/15  Yes Kathie Dike, MD  ondansetron (ZOFRAN) 4 MG tablet Take 1 tablet (4 mg total) by mouth every 8 (eight) hours as needed for nausea. 04/09/16  Yes Mauricio Gerome Apley, MD  potassium chloride SA (K-DUR,KLOR-CON) 20 MEQ tablet Take 1 tablet (20 mEq total) by mouth daily. 04/09/16  Yes Mauricio Gerome Apley, MD  sacubitril-valsartan (ENTRESTO) 97-103 MG Take 1 tablet by mouth 2 (two) times daily. 04/09/16  Yes Mauricio Gerome Apley, MD  torsemide (DEMADEX) 20 MG tablet Please take 40 mg oral every morning and 20 mg every evening. 05/10/16  Yes Albertine Patricia, MD  metFORMIN (GLUCOPHAGE) 500 MG tablet Take 1 tablet (500 mg total) by mouth 2 (two) times daily with a meal. 12/25/15   Erlene Quan, PA-C  pregabalin (LYRICA) 75 MG capsule Take 75 mg by mouth daily.    Historical  Provider, MD  sitaGLIPtin (JANUVIA) 100 MG tablet Take 100 mg by mouth daily.    Historical Provider, MD    Family History Family History  Problem Relation Age of Onset  . Diabetes Mother   . Heart disease Mother   . Hyperlipidemia Paternal Grandfather   . Hypertension Paternal Grandfather   . Heart disease Father   . Heart disease Maternal Grandmother   . ADD / ADHD Son   . Hypertension Maternal Uncle   . Heart attack Brother   . Sudden death Neg Hx     Social History Social History  Substance Use Topics  . Smoking status: Never Smoker  . Smokeless tobacco: Never Used  . Alcohol use Yes     Comment: occ     Allergies   Diclofenac; Tramadol; and Vicodin [hydrocodone-acetaminophen]   Review of Systems Review of Systems  Constitutional: Positive for fatigue. Negative for activity change and appetite  change.  HENT: Negative for congestion.   Respiratory: Positive for chest tightness and shortness of breath. Negative for cough.   Cardiovascular: Positive for leg swelling. Negative for chest pain.  Gastrointestinal: Negative for abdominal pain, nausea and vomiting.  Genitourinary: Negative for dysuria, vaginal bleeding and vaginal discharge.  Musculoskeletal: Negative for arthralgias and myalgias.  Neurological: Negative for dizziness, weakness and headaches.   A complete 10 system review of systems was obtained and all systems are negative except as noted in the HPI and PMH.    Physical Exam Updated Vital Signs BP (!) 179/125   Pulse 92   Temp 97.7 F (36.5 C) (Oral)   Resp 13   Ht 5\' 6"  (1.676 m)   Wt 240 lb (108.9 kg)   LMP 08/17/2016 (Exact Date)   SpO2 98%   BMI 38.74 kg/m   Physical Exam  Constitutional: She is oriented to person, place, and time. She appears well-developed and well-nourished. No distress.  HENT:  Head: Normocephalic and atraumatic.  Mouth/Throat: Oropharynx is clear and moist. No oropharyngeal exudate.  Eyes: Conjunctivae and EOM are normal. Pupils are equal, round, and reactive to light.  Neck: Normal range of motion. Neck supple.  L paraspinal cervical, upper trapezius tenderness  Cardiovascular: Normal rate, regular rhythm, normal heart sounds and intact distal pulses.   No murmur heard. Pulmonary/Chest: Effort normal. No respiratory distress. She has rales.  Decreased breath sounds at bases  Abdominal: Soft. There is no tenderness. There is no rebound and no guarding.  Musculoskeletal: Normal range of motion. She exhibits edema. She exhibits no tenderness.  +2 edema to knees bilaterally  Equal radial pulses and grip strengths  Neurological: She is alert and oriented to person, place, and time. No cranial nerve deficit. She exhibits normal muscle tone. Coordination normal.   5/5 strength throughout. CN 2-12 intact.Equal grip strength.   Skin:  Skin is warm.  Psychiatric: She has a normal mood and affect. Her behavior is normal.  Nursing note and vitals reviewed.    ED Treatments / Results  Labs (all labs ordered are listed, but only abnormal results are displayed) Labs Reviewed  CBC WITH DIFFERENTIAL/PLATELET - Abnormal; Notable for the following:       Result Value   WBC 11.3 (*)    Hemoglobin 10.0 (*)    HCT 32.0 (*)    MCV 74.2 (*)    MCH 23.2 (*)    All other components within normal limits  BASIC METABOLIC PANEL - Abnormal; Notable for the following:    Potassium 3.1 (*)  Glucose, Bld 241 (*)    Creatinine, Ser 1.10 (*)    Calcium 8.7 (*)    All other components within normal limits  BRAIN NATRIURETIC PEPTIDE - Abnormal; Notable for the following:    B Natriuretic Peptide 725.0 (*)    All other components within normal limits  TROPONIN I    EKG  EKG Interpretation  Date/Time:  Saturday August 28 2016 03:07:25 EDT Ventricular Rate:  94 PR Interval:    QRS Duration: 100 QT Interval:  373 QTC Calculation: 467 R Axis:   98 Text Interpretation:  Sinus rhythm Consider left atrial enlargement Borderline right axis deviation Probable left ventricular hypertrophy Nonspecific T abnormalities, inferior leads Baseline wander in lead(s) I aVL No significant change was found Confirmed by Wyvonnia Dusky  MD, [place] [phone]) on 08/28/2016 3:18:29 AM       Radiology Dg Chest 2 View  Result Date: 08/28/2016 CLINICAL DATA:  Shortness of breath EXAM: CHEST  2 VIEW COMPARISON:  Chest radiograph 05/06/2016 FINDINGS: Cardiomediastinal silhouette remains enlarged. Right basilar atelectasis is unchanged. No focal consolidation or pulmonary edema. No pneumothorax or pleural effusion. IMPRESSION: No active cardiopulmonary disease.  Unchanged cardiomegaly. Electronically Signed   By: Ulyses Jarred M.D.   On: 08/28/2016 05:23    Procedures Procedures (including critical care time)  Medications Ordered in ED Medications    hydrALAZINE (APRESOLINE) injection 10 mg (not administered)  aspirin chewable tablet 324 mg (324 mg Oral Given 08/28/16 0417)  nitroGLYCERIN (NITROGLYN) 2 % ointment 1 inch (1 inch Topical Given 08/28/16 0418)  furosemide (LASIX) injection 60 mg (60 mg Intravenous Given 08/28/16 0420)     Initial Impression / Assessment and Plan / ED Course  I have reviewed the triage vital signs and the nursing notes.  Pertinent labs & imaging results that were available during my care of the patient were reviewed by me and considered in my medical decision making (see chart for details).    Patient with history of hypertensive cardiomyopathy and clean coronaries presenting with left arm pain has been constant for the past day that became worse after she helped someone. Neurovascularly intact on exam.  EKG without acute changes. Arm pain seems consistent with cervical radiculopathy.  However patient also has dyspnea with exertion, diaphoresis and nausea. Her blood pressure is elevated. Concern for acute CHF exacerbation.  IV Lasix given. Chest x-ray obtained. Creatinine appears to be stable. Troponin is negative. Patient's blood pressure remains uncontrolled. She is given additional IV hydralazine and nitroglycerin paste.  Becomes dyspneic with exertion but not hypoxic. Still complains of tingling in her left arm which appears to be radicular in nature. With her ongoing dyspnea on exertion and uncontrolled blood pressure will plan admission for blood pressure control and sural troponins. Discussed with Dr. Rosana Hoes.  Final Clinical Impressions(s) / ED Diagnoses   Final diagnoses:  Hypertensive urgency  Acute on chronic systolic congestive heart failure [facility])  Cervical radiculopathy    New Prescriptions New Prescriptions   No medications on file     Ezequiel Essex, MD 08/28/16 [phone]

## 2016-08-28 NOTE — H&P (Signed)
History and Physical    Chelsey Santiago NOM:[id number] DOB: 1975/07/02 DOA: 08/28/2016  PCP: Rosita Fire, MD  Patient coming from:  home  Chief Complaint:  Sob, doe  HPI: Chelsey Santiago is a 41 y.o. female with medical history significant of systolic CM, HTN, DM, obesity comes in with weeks of sob and DOE.  About a 5 lb wt gain in the last week but no significant swelling in legs.  No fevers.  She takes her meds.  No chest pain.  No n/v/d.  She also is having tingling in her left arm for several days, none now.  She does not monitor her bp at home.  Pt referred for admission for uncontrolled bp.   Review of Systems: As per HPI otherwise 10 point review of systems negative.   Past Medical History:  Diagnosis Date  . Anemia    H&H of 10.6/33 and 07/2008 and 11.9/35 and 09/2010  . Anxiety   . CHF (congestive heart failure) ([place])   . Depression with anxiety   . Diabetes mellitus without complication ([place])   . Enlarged heart   . Fasting hyperglycemia   . Hypertension    Lab: Normal BMet except glucose of 118 in 09/2010  . Hypertensive heart disease 2009   Pulmonary edema postpartum; mild to moderate mitral regurgitation when hospitalized for CHF in 2009; Echocardiogram in 12/2009-no MR and normal EF; normal CXR in 09/2010  . Migraine headache   . Miscarriage 03/19/2013  . Obesity 04/16/2009  . Osteoarthritis, knee 03/29/2011  . Preeclampsia   . Pregnant   . Pulmonary edema   . Sleep apnea   . Threatened abortion in early pregnancy 03/15/2013    Past Surgical History:  Procedure Laterality Date  . BREAST REDUCTION SURGERY  2002  . CARDIAC CATHETERIZATION N/A 12/22/2015   Procedure: Left Heart Cath and Coronary Angiography;  Surgeon: Peter M Martinique, MD;  Location: [facility];  Service: Cardiovascular;  Laterality: N/A;  . CESAREAN SECTION N/A 04/09/2014   Procedure: CESAREAN SECTION;  Surgeon: Mora Bellman, MD;  Location: [facility];  Service: Obstetrics;  Laterality: N/A;  .  CHOLECYSTECTOMY       reports that she has never smoked. She has never used smokeless tobacco. She reports that she drinks alcohol. She reports that she does not use drugs.  Allergies  Allergen Reactions  . Diclofenac Swelling    AND POSSIBLE SYNCOPE  . Tramadol Nausea And Vomiting    Itching (12/21)  . Vicodin [Hydrocodone-Acetaminophen] Itching and Nausea Only    Family History  Problem Relation Age of Onset  . Diabetes Mother   . Heart disease Mother   . Hyperlipidemia Paternal Grandfather   . Hypertension Paternal Grandfather   . Heart disease Father   . Heart disease Maternal Grandmother   . ADD / ADHD Son   . Hypertension Maternal Uncle   . Heart attack Brother   . Sudden death Neg Hx     Prior to Admission medications   Medication Sig Start Date End Date Taking? Authorizing Provider  aspirin EC 81 MG tablet Take 81 mg by mouth daily.   Yes Historical Provider, MD  carvedilol (COREG) 12.5 MG tablet Take 1 tablet (12.5 mg total) by mouth 2 (two) times daily with a meal. 04/09/16  Yes Mauricio Gerome Apley, MD  escitalopram (LEXAPRO) 20 MG tablet Take 1 tablet (20 mg total) by mouth daily. 03/26/16  Yes Estill Dooms, NP  gabapentin (NEURONTIN) 300 MG capsule Take  300 mg by mouth at bedtime.   Yes Historical Provider, MD  hydrALAZINE (APRESOLINE) 50 MG tablet Take 1 tablet (50 mg total) by mouth 3 (three) times daily. 05/10/16  Yes Silver Huguenin Elgergawy, MD  insulin glargine (LANTUS) 100 UNIT/ML injection Inject 30 Units into the skin.   Yes Historical Provider, MD  Lancets (FREESTYLE) lancets Use as instructed 04/09/16  Yes Mauricio Gerome Apley, MD  nitroGLYCERIN (NITROSTAT) 0.4 MG SL tablet Place 1 tablet (0.4 mg total) under the tongue every 5 (five) minutes as needed for chest pain. 09/05/15  Yes Kathie Dike, MD  ondansetron (ZOFRAN) 4 MG tablet Take 1 tablet (4 mg total) by mouth every 8 (eight) hours as needed for nausea. 04/09/16  Yes Mauricio Gerome Apley, MD   potassium chloride SA (K-DUR,KLOR-CON) 20 MEQ tablet Take 1 tablet (20 mEq total) by mouth daily. 04/09/16  Yes Mauricio Gerome Apley, MD  sacubitril-valsartan (ENTRESTO) 97-103 MG Take 1 tablet by mouth 2 (two) times daily. 04/09/16  Yes Mauricio Gerome Apley, MD  torsemide (DEMADEX) 20 MG tablet Please take 40 mg oral every morning and 20 mg every evening. 05/10/16  Yes Albertine Patricia, MD  metFORMIN (GLUCOPHAGE) 500 MG tablet Take 1 tablet (500 mg total) by mouth 2 (two) times daily with a meal. 12/25/15   Erlene Quan, PA-C  pregabalin (LYRICA) 75 MG capsule Take 75 mg by mouth daily.    Historical Provider, MD  sitaGLIPtin (JANUVIA) 100 MG tablet Take 100 mg by mouth daily.    Historical Provider, MD    Physical Exam: Vitals:   08/28/16 0511 08/28/16 0515 08/28/16 0530 08/28/16 0545  BP: (!) 185/134 (!) 169/114 (!) 169/116   Pulse: 94 93 (!) 106   Resp: 18 (!) 30  19  Temp:      TempSrc:      SpO2: 98% 97% 99%   Weight:      Height:          Constitutional: NAD, calm, comfortable Vitals:   08/28/16 0511 08/28/16 0515 08/28/16 0530 08/28/16 0545  BP: (!) 185/134 (!) 169/114 (!) 169/116   Pulse: 94 93 (!) 106   Resp: 18 (!) 30  19  Temp:      TempSrc:      SpO2: 98% 97% 99%   Weight:      Height:       Eyes: PERRL, lids and conjunctivae normal ENMT: Mucous membranes are moist. Posterior pharynx clear of any exudate or lesions.Normal dentition.  Neck: normal, supple, no masses, no thyromegaly Respiratory: clear to auscultation bilaterally, no wheezing, no crackles. Normal respiratory effort. No accessory muscle use.  Cardiovascular: Regular rate and rhythm, no murmurs / rubs / gallops. No extremity edema. 2+ pedal pulses. No carotid bruits.  Abdomen: no tenderness, no masses palpated. No hepatosplenomegaly. Bowel sounds positive.  Musculoskeletal: no clubbing / cyanosis. No joint deformity upper and lower extremities. Good ROM, no contractures. Normal muscle tone.    Skin: no rashes, lesions, ulcers. No induration Neurologic: CN 2-12 grossly intact. Sensation intact, DTR normal. Strength 5/5 in all 4.  Psychiatric: Normal judgment and insight. Alert and oriented x 3. Normal mood.    Labs on Admission: I have personally reviewed following labs and imaging studies  CBC:  Recent Labs Lab 08/28/16 0327  WBC 11.3*  NEUTROABS 7.4  HGB 10.0*  HCT 32.0*  MCV 74.2*  PLT 625   Basic Metabolic Panel:  Recent Labs Lab 08/28/16 0327  NA 136  K  3.1*  CL 101  CO2 27  GLUCOSE 241*  BUN 20  CREATININE 1.10*  CALCIUM 8.7*   GFR: Estimated Creatinine Clearance: 84 mL/min (A) (by C-G formula based on SCr of 1.1 mg/dL (H)).  Cardiac Enzymes:  Recent Labs Lab 08/28/16 0327  TROPONINI <0.03   Radiological Exams on Admission: Dg Chest 2 View  Result Date: 08/28/2016 CLINICAL DATA:  Shortness of breath EXAM: CHEST  2 VIEW COMPARISON:  Chest radiograph 05/06/2016 FINDINGS: Cardiomediastinal silhouette remains enlarged. Right basilar atelectasis is unchanged. No focal consolidation or pulmonary edema. No pneumothorax or pleural effusion. IMPRESSION: No active cardiopulmonary disease.  Unchanged cardiomegaly. Electronically Signed   By: Ulyses Jarred M.D.   On: 08/28/2016 05:23    EKG: Independently reviewed. nsr no acute issues cxr reviewed no edema or infiltrate  Assessment/Plan 41 yo female h/o systolic cardiomyopathy with hypertensive urgency  Principal Problem:   Hypertensive urgency- ntg paste placed.  Not significantly fluid overloaded.  Increase coreg to 25mg  po bid.  Cont home meds once med rec completed.  Serial trop.  Adjust bp meds at bp tolerates.  Active Problems:   Obesity- stable   Sleep apnea- stable   Hypertensive cardiovascular disease- noted   Systolic CHF, chronic ([place])- stable, cont home diuretics   Cardiomyopathy due to hypertension ([place])- stable   Normal coronary arteries- per cath last year   Diabetes mellitus with  complication ([place])- SSI   She is considering undergoing gastric sleeve surgery.  She has been offered to do cardiac rehab at [facility] several times but cant due to lack of babysitter for her small children  PCP dr Legrand Rams   DVT prophylaxis: scds  Code Status:  full Family Communication:  none Disposition Plan:  Per day team Consults called:  none Admission status:  observation   Posey Jasmin A MD [facility]  If 7PM-7AM, please contact night-coverage [email or web address] Password [email or web address]  08/28/2016, 5:50 AM

## 2016-08-28 NOTE — Progress Notes (Signed)
Subjective: She says she feels better. She was admitted early this morning with hypertensive urgency. Her blood pressure is still up. She is still short of breath. She still having trouble with some tingling in her left arm.  Objective: Vital signs in last 24 hours: Temp:  [97.7 F (36.5 C)] 97.7 F (36.5 C) (04/14 0307) Pulse Rate:  [92-106] 105 (04/14 0600) Resp:  [13-30] 22 (04/14 0600) BP: (166-185)/(108-134) 166/108 (04/14 0600) SpO2:  [95 %-99 %] 95 % (04/14 0600) Weight:  [108.9 kg (240 lb)] 108.9 kg (240 lb) (04/14 0301) Weight change:  Last BM Date: 08/27/16  Intake/Output from previous day: 04/13 0701 - 04/14 0700 In: -  Out: 2000 [Urine:2000]  PHYSICAL EXAM General appearance: alert, cooperative, mild distress and morbidly obese Resp: clear to auscultation bilaterally Cardio: regular rate and rhythm, S1, S2 normal, no murmur, click, rub or gallop GI: soft, non-tender; bowel sounds normal; no masses,  no organomegaly Extremities: extremities normal, atraumatic, no cyanosis or edema Skin warm and dry  Lab Results:  Results for orders placed or performed during the hospital encounter of 08/28/16 (from the past 48 hour(s))  CBC with Differential/Platelet     Status: Abnormal   Collection Time: 08/28/16  3:27 AM  Result Value Ref Range   WBC 11.3 (H) 4.0 - 10.5 K/uL   RBC 4.31 3.87 - 5.11 MIL/uL   Hemoglobin 10.0 (L) 12.0 - 15.0 g/dL   HCT 32.0 (L) 36.0 - 46.0 %   MCV 74.2 (L) 78.0 - 100.0 fL   MCH 23.2 (L) 26.0 - 34.0 pg   MCHC 31.3 30.0 - 36.0 g/dL   RDW 14.0 11.5 - 15.5 %   Platelets 305 150 - 400 K/uL   Neutrophils Relative % 65 %   Neutro Abs 7.4 1.7 - 7.7 K/uL   Lymphocytes Relative 30 %   Lymphs Abs 3.4 0.7 - 4.0 K/uL   Monocytes Relative 4 %   Monocytes Absolute 0.5 0.1 - 1.0 K/uL   Eosinophils Relative 1 %   Eosinophils Absolute 0.1 0.0 - 0.7 K/uL   Basophils Relative 0 %   Basophils Absolute 0.0 0.0 - 0.1 K/uL  Basic metabolic panel     Status:  Abnormal   Collection Time: 08/28/16  3:27 AM  Result Value Ref Range   Sodium 136 135 - 145 mmol/L   Potassium 3.1 (L) 3.5 - 5.1 mmol/L   Chloride 101 101 - 111 mmol/L   CO2 27 22 - 32 mmol/L   Glucose, Bld 241 (H) 65 - 99 mg/dL   BUN 20 6 - 20 mg/dL   Creatinine, Ser 1.10 (H) 0.44 - 1.00 mg/dL   Calcium 8.7 (L) 8.9 - 10.3 mg/dL   GFR calc non Af Amer >60 >60 mL/min   GFR calc Af Amer >60 >60 mL/min    Comment: (NOTE) The eGFR has been calculated using the CKD EPI equation. This calculation has not been validated in all clinical situations. eGFR's persistently <60 mL/min signify possible Chronic Kidney Disease.    Anion gap 8 5 - 15  Troponin I     Status: None   Collection Time: 08/28/16  3:27 AM  Result Value Ref Range   Troponin I <0.03 <0.03 ng/mL  Brain natriuretic peptide     Status: Abnormal   Collection Time: 08/28/16  3:27 AM  Result Value Ref Range   B Natriuretic Peptide 725.0 (H) 0.0 - 100.0 pg/mL  Troponin I     Status: None  Collection Time: 08/28/16  8:15 AM  Result Value Ref Range   Troponin I <0.03 <0.03 ng/mL  TSH     Status: None   Collection Time: 08/28/16  8:15 AM  Result Value Ref Range   TSH 1.434 0.350 - 4.500 uIU/mL    Comment: Performed by a 3rd Generation assay with a functional sensitivity of <=0.01 uIU/mL.    ABGS No results for input(s): PHART, PO2ART, TCO2, HCO3 in the last 72 hours.  Invalid input(s): PCO2 CULTURES No results found for this or any previous visit (from the past 240 hour(s)). Studies/Results: Dg Chest 2 View  Result Date: 08/28/2016 CLINICAL DATA:  Shortness of breath EXAM: CHEST  2 VIEW COMPARISON:  Chest radiograph 05/06/2016 FINDINGS: Cardiomediastinal silhouette remains enlarged. Right basilar atelectasis is unchanged. No focal consolidation or pulmonary edema. No pneumothorax or pleural effusion. IMPRESSION: No active cardiopulmonary disease.  Unchanged cardiomegaly. Electronically Signed   By: Ulyses Jarred M.D.    On: 08/28/2016 05:23    Medications:  Prior to Admission:  Prescriptions Prior to Admission  Medication Sig Dispense Refill Last Dose  . aspirin EC 81 MG tablet Take 81 mg by mouth daily.   05/05/2016 at Unknown time  . carvedilol (COREG) 12.5 MG tablet Take 1 tablet (12.5 mg total) by mouth 2 (two) times daily with a meal. 60 tablet 0 05/05/2016 at 2000  . escitalopram (LEXAPRO) 20 MG tablet Take 1 tablet (20 mg total) by mouth daily. 30 tablet 6 05/05/2016 at Unknown time  . gabapentin (NEURONTIN) 300 MG capsule Take 300 mg by mouth at bedtime.     . hydrALAZINE (APRESOLINE) 50 MG tablet Take 1 tablet (50 mg total) by mouth 3 (three) times daily. 90 tablet 0   . insulin glargine (LANTUS) 100 UNIT/ML injection Inject 30 Units into the skin.     . Lancets (FREESTYLE) lancets Use as instructed 100 each 12 05/05/2016 at Unknown time  . nitroGLYCERIN (NITROSTAT) 0.4 MG SL tablet Place 1 tablet (0.4 mg total) under the tongue every 5 (five) minutes as needed for chest pain. 30 tablet 12 unk at unk  . ondansetron (ZOFRAN) 4 MG tablet Take 1 tablet (4 mg total) by mouth every 8 (eight) hours as needed for nausea. 20 tablet 0 Past Week at Unknown time  . potassium chloride SA (K-DUR,KLOR-CON) 20 MEQ tablet Take 1 tablet (20 mEq total) by mouth daily. 30 tablet 0 05/05/2016 at Unknown time  . sacubitril-valsartan (ENTRESTO) 97-103 MG Take 1 tablet by mouth 2 (two) times daily. 60 tablet 0 05/05/2016 at Unknown time  . torsemide (DEMADEX) 20 MG tablet Please take 40 mg oral every morning and 20 mg every evening. 90 tablet 0   . metFORMIN (GLUCOPHAGE) 500 MG tablet Take 1 tablet (500 mg total) by mouth 2 (two) times daily with a meal.   05/05/2016 at Unknown time  . pregabalin (LYRICA) 75 MG capsule Take 75 mg by mouth daily.   05/05/2016 at Unknown time  . sitaGLIPtin (JANUVIA) 100 MG tablet Take 100 mg by mouth daily.   05/05/2016 at Unknown time   Scheduled: . aspirin EC  81 mg Oral Daily  .  carvedilol  25 mg Oral BID WC  . furosemide  40 mg Intravenous Daily  . hydrALAZINE  50 mg Oral Q6H  . insulin aspart  0-9 Units Subcutaneous TID WC  . potassium chloride SA  20 mEq Oral Daily  . sodium chloride flush  3 mL Intravenous Q12H  . torsemide  40 mg Oral BID   Continuous:  WKG:SUPJSR chloride, acetaminophen, hydrALAZINE, ondansetron (ZOFRAN) IV, sodium chloride flush  Assesment: She was admitted with hypertensive urgency/crisis. 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 Her blood pressure is still up. Principal Problem:   Hypertensive urgency Active Problems:   Obesity   Sleep apnea   Hypertensive cardiovascular disease   Systolic CHF, chronic ([place])   Cardiomyopathy due to hypertension ([place])   Normal coronary arteries   Diabetes mellitus with complication ([place])    Plan: I adjusted her medications    LOS: 0 days   Karel Turpen L 08/28/2016, 10:14 AM
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
## 2016-08-28 NOTE — ED Notes (Signed)
Patient ambulated around nursing station. Patient's respirations 24-32 bpm. Patient's heart rate 110-117. O2 sat 99 percent on room air. Patient states that she is still having some tingling and numbness down her right arm. Patient returned to room, states that she feels like she is breathing harder.

## 2016-08-29 LAB — GLUCOSE, CAPILLARY: GLUCOSE-CAPILLARY: 252 mg/dL — AB (ref 65–99)

## 2016-08-29 MED ORDER — CARVEDILOL 25 MG PO TABS: 25.0000 mg | ORAL_TABLET | Freq: Two times a day (BID) | ORAL | 12 refills | Status: DC

## 2016-08-29 MED ORDER — ONDANSETRON HCL 4 MG PO TABS: 4.0000 mg | ORAL_TABLET | Freq: Three times a day (TID) | ORAL | 5 refills | Status: DC | PRN

## 2016-08-29 NOTE — Progress Notes (Signed)
Chelsey Santiago discharged Home per MD order.  Discharge instructions reviewed and discussed with the patient, all questions and concerns answered. Copy of instructions and scripts given to patient.  Allergies as of 08/29/2016      Reactions   Diclofenac Swelling   AND POSSIBLE SYNCOPE; tolerates ibuprofen per pt   Tramadol Nausea And Vomiting   Itching (12/21); tolerates ibuprofen per pt   Vicodin [hydrocodone-acetaminophen] Itching, Nausea Only      Medication List    STOP taking these medications   metFORMIN 500 MG tablet Commonly known as:  GLUCOPHAGE   pregabalin 75 MG capsule Commonly known as:  LYRICA   sitaGLIPtin 100 MG tablet Commonly known as:  JANUVIA     TAKE these medications   aspirin EC 81 MG tablet Take 81 mg by mouth daily.   carvedilol 25 MG tablet Commonly known as:  COREG Take 1 tablet (25 mg total) by mouth 2 (two) times daily with a meal. What changed:  medication strength  how much to take   escitalopram 20 MG tablet Commonly known as:  LEXAPRO Take 1 tablet (20 mg total) by mouth daily.   freestyle lancets Use as instructed   gabapentin 300 MG capsule Commonly known as:  NEURONTIN Take 300 mg by mouth at bedtime.   hydrALAZINE 50 MG tablet Commonly known as:  APRESOLINE Take 1 tablet (50 mg total) by mouth 3 (three) times daily.   insulin glargine 100 UNIT/ML injection Commonly known as:  LANTUS Inject 30 Units into the skin.   nitroGLYCERIN 0.4 MG SL tablet Commonly known as:  NITROSTAT Place 1 tablet (0.4 mg total) under the tongue every 5 (five) minutes as needed for chest pain.   ondansetron 4 MG tablet Commonly known as:  ZOFRAN Take 1 tablet (4 mg total) by mouth every 8 (eight) hours as needed for nausea.   potassium chloride SA 20 MEQ tablet Commonly known as:  K-DUR,KLOR-CON Take 1 tablet (20 mEq total) by mouth daily.   sacubitril-valsartan 97-103 MG Commonly known as:  ENTRESTO Take 1 tablet by mouth 2 (two)  times daily.   torsemide 20 MG tablet Commonly known as:  DEMADEX Please take 40 mg oral every morning and 20 mg every evening.       Patients skin is clean, dry and intact, no evidence of skin break down. IV site discontinued and catheter remains intact. Site without signs and symptoms of complications. Dressing and pressure applied.  No distress noted upon discharge.  Chelsey Santiago Chelsey Santiago 08/29/2016 11:07 AM

## 2016-08-29 NOTE — Progress Notes (Signed)
Subjective: She feels much better and wants to go home. No new complaints. No chest pain. No shortness of breath now. The tingling in her left arm is better  Objective: Vital signs in last 24 hours: Temp:  [97.7 F (36.5 C)-97.9 F (36.6 C)] 97.7 F (36.5 C) (04/15 0540) Pulse Rate:  [64-82] 77 (04/15 0540) Resp:  [15-20] 15 (04/15 0540) BP: (125-133)/(68-78) 125/68 (04/15 0540) SpO2:  [97 %-100 %] 98 % (04/15 0540) Weight change:  Last BM Date: 08/28/16  Intake/Output from previous day: 04/14 0701 - 04/15 0700 In: 720 [P.O.:720] Out: 900 [Urine:900]  PHYSICAL EXAM General appearance: alert, cooperative and no distress Resp: clear to auscultation bilaterally Cardio: regular rate and rhythm, S1, S2 normal, no murmur, click, rub or gallop GI: soft, non-tender; bowel sounds normal; no masses,  no organomegaly Extremities: extremities normal, atraumatic, no cyanosis or edema  Lab Results:  Results for orders placed or performed during the hospital encounter of 08/28/16 (from the past 48 hour(s))  CBC with Differential/Platelet     Status: Abnormal   Collection Time: 08/28/16  3:27 AM  Result Value Ref Range   WBC 11.3 (H) 4.0 - 10.5 K/uL   RBC 4.31 3.87 - 5.11 MIL/uL   Hemoglobin 10.0 (L) 12.0 - 15.0 g/dL   HCT 32.0 (L) 36.0 - 46.0 %   MCV 74.2 (L) 78.0 - 100.0 fL   MCH 23.2 (L) 26.0 - 34.0 pg   MCHC 31.3 30.0 - 36.0 g/dL   RDW 14.0 11.5 - 15.5 %   Platelets 305 150 - 400 K/uL   Neutrophils Relative % 65 %   Neutro Abs 7.4 1.7 - 7.7 K/uL   Lymphocytes Relative 30 %   Lymphs Abs 3.4 0.7 - 4.0 K/uL   Monocytes Relative 4 %   Monocytes Absolute 0.5 0.1 - 1.0 K/uL   Eosinophils Relative 1 %   Eosinophils Absolute 0.1 0.0 - 0.7 K/uL   Basophils Relative 0 %   Basophils Absolute 0.0 0.0 - 0.1 K/uL  Basic metabolic panel     Status: Abnormal   Collection Time: 08/28/16  3:27 AM  Result Value Ref Range   Sodium 136 135 - 145 mmol/L   Potassium 3.1 (L) 3.5 - 5.1 mmol/L    Chloride 101 101 - 111 mmol/L   CO2 27 22 - 32 mmol/L   Glucose, Bld 241 (H) 65 - 99 mg/dL   BUN 20 6 - 20 mg/dL   Creatinine, Ser 1.10 (H) 0.44 - 1.00 mg/dL   Calcium 8.7 (L) 8.9 - 10.3 mg/dL   GFR calc non Af Amer >60 >60 mL/min   GFR calc Af Amer >60 >60 mL/min    Comment: (NOTE) The eGFR has been calculated using the CKD EPI equation. This calculation has not been validated in all clinical situations. eGFR's persistently <60 mL/min signify possible Chronic Kidney Disease.    Anion gap 8 5 - 15  Troponin I     Status: None   Collection Time: 08/28/16  3:27 AM  Result Value Ref Range   Troponin I <0.03 <0.03 ng/mL  Brain natriuretic peptide     Status: Abnormal   Collection Time: 08/28/16  3:27 AM  Result Value Ref Range   B Natriuretic Peptide 725.0 (H) 0.0 - 100.0 pg/mL  Troponin I     Status: None   Collection Time: 08/28/16  8:15 AM  Result Value Ref Range   Troponin I <0.03 <0.03 ng/mL  TSH  Status: None   Collection Time: 08/28/16  8:15 AM  Result Value Ref Range   TSH 1.434 0.350 - 4.500 uIU/mL    Comment: Performed by a 3rd Generation assay with a functional sensitivity of <=0.01 uIU/mL.  Glucose, capillary     Status: Abnormal   Collection Time: 08/28/16 12:50 PM  Result Value Ref Range   Glucose-Capillary 249 (H) 65 - 99 mg/dL  Troponin I     Status: None   Collection Time: 08/28/16  1:32 PM  Result Value Ref Range   Troponin I <0.03 <0.03 ng/mL  Glucose, capillary     Status: Abnormal   Collection Time: 08/28/16  4:58 PM  Result Value Ref Range   Glucose-Capillary 221 (H) 65 - 99 mg/dL  Troponin I     Status: None   Collection Time: 08/28/16  6:49 PM  Result Value Ref Range   Troponin I <0.03 <0.03 ng/mL  Glucose, capillary     Status: Abnormal   Collection Time: 08/28/16  9:59 PM  Result Value Ref Range   Glucose-Capillary 246 (H) 65 - 99 mg/dL  Glucose, capillary     Status: Abnormal   Collection Time: 08/29/16  7:57 AM  Result Value Ref Range    Glucose-Capillary 252 (H) 65 - 99 mg/dL    ABGS No results for input(s): PHART, PO2ART, TCO2, HCO3 in the last 72 hours.  Invalid input(s): PCO2 CULTURES No results found for this or any previous visit (from the past 240 hour(s)). Studies/Results: Dg Chest 2 View  Result Date: 08/28/2016 CLINICAL DATA:  Shortness of breath EXAM: CHEST  2 VIEW COMPARISON:  Chest radiograph 05/06/2016 FINDINGS: Cardiomediastinal silhouette remains enlarged. Right basilar atelectasis is unchanged. No focal consolidation or pulmonary edema. No pneumothorax or pleural effusion. IMPRESSION: No active cardiopulmonary disease.  Unchanged cardiomegaly. Electronically Signed   By: Ulyses Jarred M.D.   On: 08/28/2016 05:23    Medications:  Prior to Admission:  Prescriptions Prior to Admission  Medication Sig Dispense Refill Last Dose  . aspirin EC 81 MG tablet Take 81 mg by mouth daily.   08/27/2016 at Unknown time  . carvedilol (COREG) 12.5 MG tablet Take 1 tablet (12.5 mg total) by mouth 2 (two) times daily with a meal. 60 tablet 0 08/27/2016 at 1900  . escitalopram (LEXAPRO) 20 MG tablet Take 1 tablet (20 mg total) by mouth daily. 30 tablet 6 08/27/2016 at Unknown time  . gabapentin (NEURONTIN) 300 MG capsule Take 300 mg by mouth at bedtime.   08/27/2016 at Unknown time  . hydrALAZINE (APRESOLINE) 50 MG tablet Take 1 tablet (50 mg total) by mouth 3 (three) times daily. 90 tablet 0 08/27/2016 at Unknown time  . insulin glargine (LANTUS) 100 UNIT/ML injection Inject 30 Units into the skin.   08/27/2016 at Unknown time  . Lancets (FREESTYLE) lancets Use as instructed 100 each 12 05/05/2016 at Unknown time  . nitroGLYCERIN (NITROSTAT) 0.4 MG SL tablet Place 1 tablet (0.4 mg total) under the tongue every 5 (five) minutes as needed for chest pain. 30 tablet 12 unknown  . potassium chloride SA (K-DUR,KLOR-CON) 20 MEQ tablet Take 1 tablet (20 mEq total) by mouth daily. 30 tablet 0 08/27/2016 at Unknown time  .  sacubitril-valsartan (ENTRESTO) 97-103 MG Take 1 tablet by mouth 2 (two) times daily. 60 tablet 0 08/27/2016 at Unknown time  . torsemide (DEMADEX) 20 MG tablet Please take 40 mg oral every morning and 20 mg every evening. 90 tablet 0 08/27/2016 at Unknown  time  . [DISCONTINUED] ondansetron (ZOFRAN) 4 MG tablet Take 1 tablet (4 mg total) by mouth every 8 (eight) hours as needed for nausea. 20 tablet 0 unknown  . metFORMIN (GLUCOPHAGE) 500 MG tablet Take 1 tablet (500 mg total) by mouth 2 (two) times daily with a meal.   05/05/2016 at Unknown time  . pregabalin (LYRICA) 75 MG capsule Take 75 mg by mouth daily.   05/05/2016 at Unknown time  . sitaGLIPtin (JANUVIA) 100 MG tablet Take 100 mg by mouth daily.   05/05/2016 at Unknown time   Scheduled: . aspirin EC  81 mg Oral Daily  . carvedilol  25 mg Oral BID WC  . furosemide  40 mg Intravenous Daily  . gabapentin  300 mg Oral QHS  . hydrALAZINE  50 mg Oral Q6H  . insulin aspart  0-9 Units Subcutaneous TID WC  . potassium chloride SA  20 mEq Oral Daily  . sodium chloride flush  3 mL Intravenous Q12H  . torsemide  40 mg Oral BID   Continuous:  ONG:EXBMWU chloride, acetaminophen, hydrALAZINE, ibuprofen, ondansetron (ZOFRAN) IV, sodium chloride flush  Assesment: She was admitted with hypertensive urgency. She is much improved. She has chronic systolic heart failure. She has cardiomyopathy due to hypertension. She has obesity which is stable sleep apnea which is about the same. She feels much better and is ready for discharge home. Principal Problem:   Hypertensive urgency Active Problems:   Obesity   Sleep apnea   Hypertensive cardiovascular disease   Systolic CHF, chronic ([place])   Cardiomyopathy due to hypertension ([place])   Normal coronary arteries   Diabetes mellitus with complication ([place])    Plan: Discharge home today    LOS: 0 days   Katrine Radich L 08/29/2016, 10:02 AM

## 2016-08-29 NOTE — Discharge Summary (Signed)
Physician Discharge Summary  Patient ID: Chelsey Santiago MRN: [id number] DOB/AGE: December 07, 1975 41 y.o. Primary Care Physician:FANTA,TESFAYE, MD Admit date: 08/28/2016 Discharge date: 08/29/2016    Discharge Diagnoses:   Principal Problem:   Hypertensive urgency Active Problems:   Obesity   Sleep apnea   Hypertensive cardiovascular disease   Systolic CHF, chronic ([place])   Cardiomyopathy due to hypertension ([place])   Normal coronary arteries   Diabetes mellitus with complication ([facility])   Allergies as of 08/29/2016      Reactions   Diclofenac Swelling   AND POSSIBLE SYNCOPE; tolerates ibuprofen per pt   Tramadol Nausea And Vomiting   Itching (12/21); tolerates ibuprofen per pt   Vicodin [hydrocodone-acetaminophen] Itching, Nausea Only      Medication List    STOP taking these medications   metFORMIN 500 MG tablet Commonly known as:  GLUCOPHAGE   pregabalin 75 MG capsule Commonly known as:  LYRICA   sitaGLIPtin 100 MG tablet Commonly known as:  JANUVIA     TAKE these medications   aspirin EC 81 MG tablet Take 81 mg by mouth daily.   carvedilol 25 MG tablet Commonly known as:  COREG Take 1 tablet (25 mg total) by mouth 2 (two) times daily with a meal. What changed:  medication strength  how much to take   escitalopram 20 MG tablet Commonly known as:  LEXAPRO Take 1 tablet (20 mg total) by mouth daily.   freestyle lancets Use as instructed   gabapentin 300 MG capsule Commonly known as:  NEURONTIN Take 300 mg by mouth at bedtime.   hydrALAZINE 50 MG tablet Commonly known as:  APRESOLINE Take 1 tablet (50 mg total) by mouth 3 (three) times daily.   insulin glargine 100 UNIT/ML injection Commonly known as:  LANTUS Inject 30 Units into the skin.   nitroGLYCERIN 0.4 MG SL tablet Commonly known as:  NITROSTAT Place 1 tablet (0.4 mg total) under the tongue every 5 (five) minutes as needed for chest pain.   ondansetron 4 MG tablet Commonly known as:   ZOFRAN Take 1 tablet (4 mg total) by mouth every 8 (eight) hours as needed for nausea.   potassium chloride SA 20 MEQ tablet Commonly known as:  K-DUR,KLOR-CON Take 1 tablet (20 mEq total) by mouth daily.   sacubitril-valsartan 97-103 MG Commonly known as:  ENTRESTO Take 1 tablet by mouth 2 (two) times daily.   torsemide 20 MG tablet Commonly known as:  DEMADEX Please take 40 mg oral every morning and 20 mg every evening.       Discharged Condition:Improved    Consults: None  Significant Diagnostic Studies: Dg Chest 2 View  Result Date: 08/28/2016 CLINICAL DATA:  Shortness of breath EXAM: CHEST  2 VIEW COMPARISON:  Chest radiograph 05/06/2016 FINDINGS: Cardiomediastinal silhouette remains enlarged. Right basilar atelectasis is unchanged. No focal consolidation or pulmonary edema. No pneumothorax or pleural effusion. IMPRESSION: No active cardiopulmonary disease.  Unchanged cardiomegaly. Electronically Signed   By: Ulyses Jarred M.D.   On: 08/28/2016 05:23    Lab Results: Basic Metabolic Panel:  Recent Labs  08/28/16 0327  NA 136  K 3.1*  CL 101  CO2 27  GLUCOSE 241*  BUN 20  CREATININE 1.10*  CALCIUM 8.7*   Liver Function Tests: No results for input(s): AST, ALT, ALKPHOS, BILITOT, PROT, ALBUMIN in the last 72 hours.   CBC:  Recent Labs  08/28/16 0327  WBC 11.3*  NEUTROABS 7.4  HGB 10.0*  HCT 32.0*  MCV 74.2*  PLT 305    No results found for this or any previous visit (from the past 240 hour(s)).   Hospital Course: She came to the hospital because of shortness of breath chest discomfort and numbness of her left arm. She was found to have a very elevated blood pressure. She was treated in the emergency room but still had elevated blood pressure. Adjustments were made in her medications and over the next 24 hours she got much better. By the time of discharge her blood pressure was 125/68 she felt well the numbness is gone away and she was ready for  discharge.  Discharge Exam: Blood pressure 125/68, pulse 77, temperature 97.7 F (36.5 C), temperature source Oral, resp. rate 15, height 5\' 6"  (1.676 m), weight 108.9 kg (240 lb), last menstrual period 08/17/2016, SpO2 98 %. She is awake and alert. No neurological findings. Chest is clear. Heart is regular.  Disposition: Home. Follow-up with Dr. Legrand Rams  Discharge Instructions    Diet - low sodium heart healthy    Complete by:  As directed    Increase activity slowly    Complete by:  As directed         Signed: Erie Radu L   08/29/2016, 10:25 AM

## 2016-09-01 ENCOUNTER — Telehealth (HOSPITAL_COMMUNITY): Payer: Self-pay | Admitting: *Deleted

## 2016-09-01 NOTE — Telephone Encounter (Signed)
Patient called and left message on Triage line stated that her BP is 197/133 and has headache with nausea/vomiting.  I called patient back as soon as I listened to the message and I advised her to go to the Emergency room immediately.  There were several kids crying in the background to the point it was hard to hear the patient.  She stated she didn't have anyone to come help with the kids.  I asked her if she could call someone now and she said she would try.  She stated she couldn't just leave anyone with her kids but I explained that her body will not be able to compensate with that high of a BP and she is at high risk for stroke.  She stated she would try calling and she ended the call.

## 2016-09-15 DIAGNOSIS — Z736 Limitation of activities due to disability: Secondary | ICD-10-CM

## 2016-10-05 DIAGNOSIS — I252 Old myocardial infarction: Secondary | ICD-10-CM | POA: Insufficient documentation

## 2016-10-29 ENCOUNTER — Other Ambulatory Visit: Payer: Self-pay | Admitting: Cardiology

## 2016-11-25 DIAGNOSIS — E559 Vitamin D deficiency, unspecified: Secondary | ICD-10-CM | POA: Insufficient documentation

## 2016-11-25 DIAGNOSIS — E611 Iron deficiency: Secondary | ICD-10-CM | POA: Insufficient documentation

## 2017-03-25 ENCOUNTER — Other Ambulatory Visit: Payer: Self-pay

## 2017-03-25 ENCOUNTER — Observation Stay (HOSPITAL_COMMUNITY)
Admission: EM | Admit: 2017-03-25 | Discharge: 2017-03-26 | Disposition: A | Discharge status: Home / Self Care | Payer: Medicaid Other | Attending: Internal Medicine | Admitting: Internal Medicine

## 2017-03-25 ENCOUNTER — Encounter (HOSPITAL_COMMUNITY): Payer: Self-pay

## 2017-03-25 ENCOUNTER — Emergency Department (HOSPITAL_COMMUNITY): Payer: Medicaid Other

## 2017-03-25 DIAGNOSIS — Z833 Family history of diabetes mellitus: Secondary | ICD-10-CM

## 2017-03-25 DIAGNOSIS — E669 Obesity, unspecified: Secondary | ICD-10-CM | POA: Diagnosis not present

## 2017-03-25 DIAGNOSIS — Z6838 Body mass index (BMI) 38.0-38.9, adult: Secondary | ICD-10-CM | POA: Insufficient documentation

## 2017-03-25 DIAGNOSIS — Z888 Allergy status to other drugs, medicaments and biological substances status: Secondary | ICD-10-CM | POA: Insufficient documentation

## 2017-03-25 DIAGNOSIS — F419 Anxiety disorder, unspecified: Secondary | ICD-10-CM | POA: Diagnosis not present

## 2017-03-25 DIAGNOSIS — Z8249 Family history of ischemic heart disease and other diseases of the circulatory system: Secondary | ICD-10-CM | POA: Diagnosis not present

## 2017-03-25 DIAGNOSIS — I429 Cardiomyopathy, unspecified: Secondary | ICD-10-CM | POA: Diagnosis not present

## 2017-03-25 DIAGNOSIS — I160 Hypertensive urgency: Secondary | ICD-10-CM | POA: Diagnosis not present

## 2017-03-25 DIAGNOSIS — Z885 Allergy status to narcotic agent status: Secondary | ICD-10-CM | POA: Diagnosis not present

## 2017-03-25 DIAGNOSIS — E118 Type 2 diabetes mellitus with unspecified complications: Secondary | ICD-10-CM | POA: Diagnosis present

## 2017-03-25 DIAGNOSIS — I252 Old myocardial infarction: Secondary | ICD-10-CM | POA: Diagnosis not present

## 2017-03-25 DIAGNOSIS — G4733 Obstructive sleep apnea (adult) (pediatric): Secondary | ICD-10-CM | POA: Diagnosis not present

## 2017-03-25 DIAGNOSIS — I5023 Acute on chronic systolic (congestive) heart failure: Secondary | ICD-10-CM

## 2017-03-25 DIAGNOSIS — Z83438 Family history of other disorder of lipoprotein metabolism and other lipidemia: Secondary | ICD-10-CM

## 2017-03-25 DIAGNOSIS — Z79899 Other long term (current) drug therapy: Secondary | ICD-10-CM | POA: Diagnosis not present

## 2017-03-25 DIAGNOSIS — I5043 Acute on chronic combined systolic (congestive) and diastolic (congestive) heart failure: Secondary | ICD-10-CM | POA: Diagnosis not present

## 2017-03-25 DIAGNOSIS — R000 Tachycardia, unspecified: Secondary | ICD-10-CM | POA: Diagnosis not present

## 2017-03-25 DIAGNOSIS — Z794 Long term (current) use of insulin: Secondary | ICD-10-CM | POA: Insufficient documentation

## 2017-03-25 DIAGNOSIS — F329 Major depressive disorder, single episode, unspecified: Secondary | ICD-10-CM | POA: Diagnosis not present

## 2017-03-25 DIAGNOSIS — R51 Headache: Secondary | ICD-10-CM | POA: Diagnosis not present

## 2017-03-25 DIAGNOSIS — E119 Type 2 diabetes mellitus without complications: Secondary | ICD-10-CM | POA: Insufficient documentation

## 2017-03-25 DIAGNOSIS — I110 Hypertensive heart disease with heart failure: Secondary | ICD-10-CM | POA: Insufficient documentation

## 2017-03-25 DIAGNOSIS — I428 Other cardiomyopathies: Secondary | ICD-10-CM | POA: Diagnosis not present

## 2017-03-25 DIAGNOSIS — M1710 Unilateral primary osteoarthritis, unspecified knee: Secondary | ICD-10-CM | POA: Insufficient documentation

## 2017-03-25 DIAGNOSIS — Z7982 Long term (current) use of aspirin: Secondary | ICD-10-CM | POA: Insufficient documentation

## 2017-03-25 DIAGNOSIS — Z9989 Dependence on other enabling machines and devices: Secondary | ICD-10-CM | POA: Diagnosis not present

## 2017-03-25 LAB — RAPID URINE DRUG SCREEN, HOSP PERFORMED
Amphetamines: NOT DETECTED
Barbiturates: NOT DETECTED
Benzodiazepines: NOT DETECTED
Cocaine: NOT DETECTED
OPIATES: NOT DETECTED
Tetrahydrocannabinol: NOT DETECTED

## 2017-03-25 LAB — MRSA PCR SCREENING: MRSA by PCR: POSITIVE — AB

## 2017-03-25 LAB — GLUCOSE, CAPILLARY
GLUCOSE-CAPILLARY: 243 mg/dL — AB (ref 65–99)
GLUCOSE-CAPILLARY: 202 mg/dL — AB (ref 65–99)
GLUCOSE-CAPILLARY: 277 mg/dL — AB (ref 65–99)

## 2017-03-25 LAB — CBC
HEMATOCRIT: 33.5 % — AB (ref 36.0–46.0)
HEMOGLOBIN: 10.2 g/dL — AB (ref 12.0–15.0)
MCH: 22.9 pg — AB (ref 26.0–34.0)
MCHC: 30.4 g/dL (ref 30.0–36.0)
MCV: 75.3 fL — ABNORMAL LOW (ref 78.0–100.0)
Platelets: 217 10*3/uL (ref 150–400)
RBC: 4.45 MIL/uL (ref 3.87–5.11)
RDW: 15 % (ref 11.5–15.5)
WBC: 11 10*3/uL — ABNORMAL HIGH (ref 4.0–10.5)

## 2017-03-25 LAB — BASIC METABOLIC PANEL
ANION GAP: 10 (ref 5–15)
BUN: 12 mg/dL (ref 6–20)
CALCIUM: 8.9 mg/dL (ref 8.9–10.3)
CO2: 26 mmol/L (ref 22–32)
Chloride: 98 mmol/L — ABNORMAL LOW (ref 101–111)
Creatinine, Ser: 0.83 mg/dL (ref 0.44–1.00)
GFR calc non Af Amer: >60 mL/min (ref >60)
Glucose, Bld: 220 mg/dL — ABNORMAL HIGH (ref 65–99)
POTASSIUM: 3.3 mmol/L — AB (ref 3.5–5.1)
Sodium: 134 mmol/L — ABNORMAL LOW (ref 135–145)

## 2017-03-25 LAB — I-STAT TROPONIN, ED: TROPONIN I, POC: 0.01 ng/mL (ref 0.00–0.08)

## 2017-03-25 LAB — HEMOGLOBIN A1C
Hgb A1c MFr Bld: 10 % — ABNORMAL HIGH (ref 4.8–5.6)
Mean Plasma Glucose: 240.3 mg/dL

## 2017-03-25 LAB — TSH: TSH: 0.534 u[IU]/mL (ref 0.350–4.500)

## 2017-03-25 LAB — TROPONIN I
Troponin I: <0.03 ng/mL (ref <0.03)
Troponin I: <0.03 ng/mL (ref <0.03)

## 2017-03-25 LAB — BRAIN NATRIURETIC PEPTIDE: B Natriuretic Peptide: 784.7 pg/mL — ABNORMAL HIGH (ref 0.0–100.0)

## 2017-03-25 MED ORDER — TORSEMIDE 20 MG PO TABS
40.0000 mg | ORAL_TABLET | Freq: Once | ORAL | Status: AC
  Administered 2017-03-25: 40 mg via ORAL
  Filled 2017-03-25: qty 2

## 2017-03-25 MED ORDER — SACUBITRIL-VALSARTAN 97-103 MG PO TABS
1.0000 | ORAL_TABLET | Freq: Two times a day (BID) | ORAL | Status: DC
  Administered 2017-03-25 – 2017-03-26 (×3): 1 via ORAL
  Filled 2017-03-25 (×3): qty 1

## 2017-03-25 MED ORDER — ACETAMINOPHEN 325 MG PO TABS
650.0000 mg | ORAL_TABLET | Freq: Four times a day (QID) | ORAL | Status: DC | PRN
  Administered 2017-03-25: 650 mg via ORAL
  Filled 2017-03-25 (×2): qty 2

## 2017-03-25 MED ORDER — CARVEDILOL 12.5 MG PO TABS
25.0000 mg | ORAL_TABLET | Freq: Once | ORAL | Status: AC
  Administered 2017-03-25: 25 mg via ORAL
  Filled 2017-03-25: qty 2

## 2017-03-25 MED ORDER — KETOROLAC TROMETHAMINE 15 MG/ML IJ SOLN
INTRAMUSCULAR | Status: AC
  Filled 2017-03-25: qty 1

## 2017-03-25 MED ORDER — KETOROLAC TROMETHAMINE 15 MG/ML IJ SOLN
15.0000 mg | Freq: Once | INTRAMUSCULAR | Status: AC
  Administered 2017-03-25: 15 mg via INTRAVENOUS

## 2017-03-25 MED ORDER — HYDRALAZINE HCL 50 MG PO TABS
50.0000 mg | ORAL_TABLET | Freq: Three times a day (TID) | ORAL | Status: DC
  Administered 2017-03-25 – 2017-03-26 (×3): 50 mg via ORAL
  Filled 2017-03-25 (×3): qty 1

## 2017-03-25 MED ORDER — PROMETHAZINE HCL 25 MG PO TABS
25.0000 mg | ORAL_TABLET | Freq: Once | ORAL | Status: AC
  Administered 2017-03-25: 25 mg via ORAL
  Filled 2017-03-25: qty 1

## 2017-03-25 MED ORDER — INSULIN GLARGINE 100 UNIT/ML ~~LOC~~ SOLN
10.0000 [IU] | Freq: Every day | SUBCUTANEOUS | Status: DC
  Administered 2017-03-25: 10 [IU] via SUBCUTANEOUS
  Filled 2017-03-25: qty 0.1

## 2017-03-25 MED ORDER — HYDRALAZINE HCL 25 MG PO TABS
50.0000 mg | ORAL_TABLET | Freq: Once | ORAL | Status: AC
  Administered 2017-03-25: 50 mg via ORAL
  Filled 2017-03-25: qty 2

## 2017-03-25 MED ORDER — CARVEDILOL 25 MG PO TABS
25.0000 mg | ORAL_TABLET | Freq: Two times a day (BID) | ORAL | Status: DC
  Administered 2017-03-25 – 2017-03-26 (×2): 25 mg via ORAL
  Filled 2017-03-25 (×2): qty 1

## 2017-03-25 MED ORDER — ASPIRIN EC 81 MG PO TBEC
81.0000 mg | DELAYED_RELEASE_TABLET | Freq: Every day | ORAL | Status: DC
  Administered 2017-03-25 – 2017-03-26 (×2): 81 mg via ORAL
  Filled 2017-03-25 (×3): qty 1

## 2017-03-25 MED ORDER — CHLORHEXIDINE GLUCONATE CLOTH 2 % EX PADS: 6.0000 | MEDICATED_PAD | Freq: Every day | CUTANEOUS | Status: DC

## 2017-03-25 MED ORDER — ACETAMINOPHEN 650 MG RE SUPP: 650.0000 mg | Freq: Four times a day (QID) | RECTAL | Status: DC | PRN

## 2017-03-25 MED ORDER — KETOROLAC TROMETHAMINE 15 MG/ML IJ SOLN
15.0000 mg | Freq: Once | INTRAMUSCULAR | Status: AC
  Administered 2017-03-25: 15 mg via INTRAVENOUS
  Filled 2017-03-25: qty 1

## 2017-03-25 MED ORDER — POTASSIUM CHLORIDE CRYS ER 20 MEQ PO TBCR
40.0000 meq | EXTENDED_RELEASE_TABLET | Freq: Two times a day (BID) | ORAL | Status: DC
  Administered 2017-03-25 – 2017-03-26 (×3): 40 meq via ORAL
  Filled 2017-03-25 (×3): qty 2

## 2017-03-25 MED ORDER — MUPIROCIN 2 % EX OINT
1.0000 "application " | TOPICAL_OINTMENT | Freq: Two times a day (BID) | CUTANEOUS | Status: DC
  Administered 2017-03-25 – 2017-03-26 (×2): 1 via NASAL
  Filled 2017-03-25: qty 22

## 2017-03-25 MED ORDER — SODIUM CHLORIDE 0.9% FLUSH
3.0000 mL | Freq: Two times a day (BID) | INTRAVENOUS | Status: DC
  Administered 2017-03-25 – 2017-03-26 (×2): 3 mL via INTRAVENOUS

## 2017-03-25 MED ORDER — FUROSEMIDE 10 MG/ML IJ SOLN
80.0000 mg | Freq: Two times a day (BID) | INTRAMUSCULAR | Status: DC
  Administered 2017-03-25 – 2017-03-26 (×2): 80 mg via INTRAVENOUS
  Filled 2017-03-25 (×2): qty 8

## 2017-03-25 MED ORDER — ENOXAPARIN SODIUM 40 MG/0.4ML ~~LOC~~ SOLN
40.0000 mg | SUBCUTANEOUS | Status: DC
  Administered 2017-03-25: 40 mg via SUBCUTANEOUS
  Filled 2017-03-25: qty 0.4

## 2017-03-25 MED ORDER — ESCITALOPRAM OXALATE 20 MG PO TABS
20.0000 mg | ORAL_TABLET | Freq: Every day | ORAL | Status: DC
  Administered 2017-03-25 – 2017-03-26 (×2): 20 mg via ORAL
  Filled 2017-03-25 (×2): qty 1

## 2017-03-25 MED ORDER — INSULIN ASPART 100 UNIT/ML ~~LOC~~ SOLN
0.0000 [IU] | Freq: Three times a day (TID) | SUBCUTANEOUS | Status: DC
  Administered 2017-03-25 – 2017-03-26 (×4): 3 [IU] via SUBCUTANEOUS

## 2017-03-25 NOTE — ED Notes (Signed)
Attempted to call report

## 2017-03-25 NOTE — Progress Notes (Signed)
Patient complaining for headache unrelieved by Tylenol and nausea. MD made aware. Will continue to monitor.

## 2017-03-25 NOTE — ED Triage Notes (Signed)
Pt states that for the past week she has been having chest tightness and SOB with nausea. Pain radiates to L arm. Hx of heart failure and edema to both feet.

## 2017-03-25 NOTE — ED Notes (Signed)
ED Provider at bedside. 

## 2017-03-25 NOTE — Progress Notes (Signed)
CRITICAL VALUE ALERT  Critical Value:  MRSA positive  Date & Time Notied:  03/25/2017; 17:44  Provider Notified: Dr. Lynnae January  Orders Received/Actions taken: see MAR

## 2017-03-25 NOTE — ED Provider Notes (Signed)
[facility] Provider Note   CSN: [id number] Arrival date & time: 03/25/17  0544     History   Chief Complaint Chief Complaint  Patient presents with  . Chest Pain    HPI Chelsey Santiago is a 41 y.o. female w PMHx CHF, HTN, STEMI, T2DM, presenting to the ED with 1 week of worsening SOB and chest tightness. Pt states she has had inc SOB with exertion and laying flat. Also reports increased lower extremity swelling. Reports dry cough and runny nose. States she has been taking her HTN and CHF medications as prescribed, however has not had her morning doses today. Denies palpitations, abd pain, nasal congestion, HA, vision changes, or other symptoms.  The history is provided by the patient.    Past Medical History:  Diagnosis Date  . Anemia    H&H of 10.6/33 and 07/2008 and 11.9/35 and 09/2010  . Anxiety   . CHF (congestive heart failure) ([place])   . Depression with anxiety   . Diabetes mellitus without complication ([place])   . Enlarged heart   . Fasting hyperglycemia   . Hypertension    Lab: Normal BMet except glucose of 118 in 09/2010  . Hypertensive heart disease 2009   Pulmonary edema postpartum; mild to moderate mitral regurgitation when hospitalized for CHF in 2009; Echocardiogram in 12/2009-no MR and normal EF; normal CXR in 09/2010  . Migraine headache   . Miscarriage 03/19/2013  . Obesity 04/16/2009  . Osteoarthritis, knee 03/29/2011  . Preeclampsia   . Pregnant   . Pulmonary edema   . Sleep apnea   . Threatened abortion in early pregnancy 03/15/2013    Patient Active Problem List   Diagnosis Date Noted  . CHF (congestive heart failure) ([place]) 05/06/2016  . Diabetes mellitus with complication ([place]) 91/47/8295  . Leukocytosis 05/06/2016  . Neuropathy 05/06/2016  . Depression 04/16/2016  . Chronic tension-type headache, not intractable 04/16/2016  . AKI (acute kidney injury) ([place])   . Hyperkalemia   . Nonischemic cardiomyopathy ([place])   .  Acute on chronic combined systolic and diastolic ACC/AHA stage C congestive heart failure ([place]) 04/03/2016  . Acute on chronic combined systolic and diastolic CHF, NYHA class 4 ([place]) 04/03/2016  . Hypertensive emergency 04/03/2016  . Cardiomyopathy due to hypertension ([place]) 12/22/2015  . Normal coronary arteries 12/22/2015  . Troponin level elevated 12/22/2015  . NSTEMI (non-ST elevated myocardial infarction) ([place]) 12/20/2015  . Dental infection 10/10/2015  . Chest pain 09/05/2015  . Systolic CHF, chronic ([place]) 09/05/2015  . LLQ pain   . Type 2 diabetes mellitus without complication, without long-term current use of insulin ([place]) 04/28/2014  . Essential hypertension   . 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 Hypertensive urgency 04/22/2014  . Acute CHF ([place]) 04/22/2014  . S/P cesarean section 04/11/2014  . Acute pulmonary edema ([place]) 04/11/2014  . Postoperative anemia 04/11/2014  . Elevated serum creatinine 04/11/2014  . Preeclampsia, severe 04/09/2014  . Pre-eclampsia superimposed on chronic hypertension, antepartum 04/08/2014  . Dyspnea 04/08/2014  . Polyhydramnios in third trimester, antepartum 03/14/2014  . Abnormal maternal glucose tolerance, antepartum 03/11/2014  . High-risk pregnancy 03/11/2014  . Chronic hypertension in pregnancy 03/11/2014  . Impaired glucose tolerance during pregnancy, antepartum 11/27/2013  . Fibroids 11/22/2013  . History of gestational diabetes in prior pregnancy, currently pregnant in first trimester 11/22/2013  . Hx of preeclampsia, prior pregnancy, currently pregnant 11/22/2013  . Short interval between pregnancies affecting pregnancy, antepartum 11/22/2013  . Supervision of high-risk  pregnancy of elderly primigravida (>= 26 years old at delivery), third trimester 11/22/2013  . Miscarriage 03/19/2013  . 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 Hypertension   . Hypertensive cardiovascular disease   . Microcytic anemia   . Osteoarthritis, knee 03/29/2011  .  OSA (obstructive sleep apnea) 12/09/2009  . Obesity 04/16/2009    Past Surgical History:  Procedure Laterality Date  . BREAST REDUCTION SURGERY  2002  . CHOLECYSTECTOMY      OB History    Gravida Para Term Preterm AB Living   11 6 5 1 5 6    SAB TAB Ectopic Multiple Live Births   3 2   0 6       Home Medications    Prior to Admission medications   Medication Sig Start Date End Date Taking? Authorizing Provider  aspirin EC 81 MG tablet Take 81 mg by mouth daily.   Yes [provider]  carvedilol (COREG) 25 MG tablet Take 1 tablet (25 mg total) by mouth 2 (two) times daily with a meal. 08/29/16  Yes Sinda Du, MD  escitalopram (LEXAPRO) 20 MG tablet Take 1 tablet (20 mg total) by mouth daily. 03/26/16  Yes Derrek Monaco A, NP  gabapentin (NEURONTIN) 300 MG capsule Take 300 mg by mouth at bedtime.   Yes [provider]  hydrALAZINE (APRESOLINE) 50 MG tablet Take 1 tablet (50 mg total) by mouth 3 (three) times daily. 05/10/16  Yes Elgergawy, Silver Huguenin, MD  insulin glargine (LANTUS) 100 UNIT/ML injection Inject 30 Units at bedtime into the skin.    Yes [provider]  Lancets (FREESTYLE) lancets Use as instructed 04/09/16  Yes Arrien, Jimmy Picket, MD  metFORMIN (GLUCOPHAGE) 500 MG tablet Take 500 mg 2 (two) times daily with a meal by mouth.   Yes [provider]  ondansetron (ZOFRAN) 4 MG tablet Take 1 tablet (4 mg total) by mouth every 8 (eight) hours as needed for nausea. 08/29/16  Yes Sinda Du, MD  potassium chloride SA (K-DUR,KLOR-CON) 20 MEQ tablet Take 1 tablet (20 mEq total) by mouth daily. 04/09/16  Yes Arrien, Jimmy Picket, MD  sacubitril-valsartan (ENTRESTO) 97-103 MG Take 1 tablet by mouth 2 (two) times daily. 04/09/16  Yes Arrien, Jimmy Picket, MD  torsemide (DEMADEX) 20 MG tablet Please take 40 mg oral every morning and 20 mg every evening. Patient taking differently: Take 20-40 mg 2 (two) times daily by mouth.  Please take 40 mg oral every morning and 20 mg every evening. 05/10/16  Yes Elgergawy, Silver Huguenin, MD  ENTRESTO 24-26 MG TAKE (1) TABLET BY MOUTH TWICE DAILY. Patient not taking: Reported on 03/25/2017 10/29/16   Lendon Colonel, NP  nitroGLYCERIN (NITROSTAT) 0.4 MG SL tablet Place 1 tablet (0.4 mg total) under the tongue every 5 (five) minutes as needed for chest pain. 09/05/15   Kathie Dike, MD    Family History Family History  Problem Relation Age of Onset  . Diabetes Mother   . Heart disease Mother   . Hyperlipidemia Paternal Grandfather   . Hypertension Paternal Grandfather   . Heart disease Father   . Heart disease Maternal Grandmother   . ADD / ADHD Son   . Hypertension Maternal Uncle   . Heart attack Brother   . Sudden death Neg Hx     Social History Social History   Tobacco Use  . Smoking status: Never Smoker  . Smokeless tobacco: Never Used  Substance Use Topics  . Alcohol use: Yes    Comment:  occ  . Drug use: No     Allergies   Diclofenac; Tramadol; and Vicodin [hydrocodone-acetaminophen]   Review of Systems Review of Systems  Constitutional: Negative for chills and fever.  HENT: Positive for rhinorrhea.   Eyes: Negative for visual disturbance.  Respiratory: Positive for cough, chest tightness and shortness of breath.   Cardiovascular: Positive for leg swelling. Negative for chest pain and palpitations.  Gastrointestinal: Negative for abdominal pain.  Neurological: Negative for headaches.  All other systems reviewed and are negative.    Physical Exam Updated Vital Signs BP (!) 170/112 (BP Location: Right Arm) Comment: RN notified  Pulse 84   Temp 97.9 F (36.6 C) (Oral)   Resp 20   Ht 5\' 6"  (1.676 m)   Wt 111.9 kg (246 lb 11.1 oz)   SpO2 100%   BMI 39.82 kg/m   Physical Exam  Constitutional: She appears well-developed and well-nourished. She does not appear ill. No distress.  HENT:  Head: Normocephalic and atraumatic.  Eyes:  Conjunctivae and EOM are normal. Pupils are equal, round, and reactive to light.  Neck: Normal range of motion. Neck supple.  Cardiovascular: Regular rhythm and normal pulses. Tachycardia present. Exam reveals gallop and S3.  Pulses:      Dorsalis pedis pulses are 2+ on the right side.       Posterior tibial pulses are 2+ on the right side.  Pulmonary/Chest: Effort normal and breath sounds normal. No respiratory distress. She has no wheezes. She has no rhonchi. She has no rales.  Abdominal: Soft. Bowel sounds are normal. She exhibits no mass. There is no tenderness.  Musculoskeletal: Normal range of motion.       Right lower leg: Normal. She exhibits edema.       Left lower leg: Normal. She exhibits edema.  1+ pitting edema b/l LE.  Neurological: She is alert.  Skin: Skin is warm.  Psychiatric: She has a normal mood and affect. Her behavior is normal.  Nursing note and vitals reviewed.    ED Treatments / Results  Labs (all labs ordered are listed, but only abnormal results are displayed) Labs Reviewed  BASIC METABOLIC PANEL - Abnormal; Notable for the following components:      Result Value   Sodium 134 (*)    Potassium 3.3 (*)    Chloride 98 (*)    Glucose, Bld 220 (*)    All other components within normal limits  CBC - Abnormal; Notable for the following components:   WBC 11.0 (*)    Hemoglobin 10.2 (*)    HCT 33.5 (*)    MCV 75.3 (*)    MCH 22.9 (*)    All other components within normal limits  BRAIN NATRIURETIC PEPTIDE - Abnormal; Notable for the following components:   B Natriuretic Peptide 784.7 (*)    All other components within normal limits  HEMOGLOBIN A1C - Abnormal; Notable for the following components:   Hgb A1c MFr Bld 10.0 (*)    All other components within normal limits  GLUCOSE, CAPILLARY - Abnormal; Notable for the following components:   Glucose-Capillary 243 (*)    All other components within normal limits  MRSA PCR SCREENING  TROPONIN I  TSH  RAPID  URINE DRUG SCREEN, HOSP PERFORMED  HIV ANTIBODY (ROUTINE TESTING)  TROPONIN I  TROPONIN I  I-STAT TROPONIN, ED    EKG  EKG Interpretation  Date/Time:  Friday March 25 2017 05:59:22 EST Ventricular Rate:  101 PR Interval:  158 QRS  Duration: 94 QT Interval:  384 QTC Calculation: 497 R Axis:   26 Text Interpretation:  Sinus tachycardia Right atrial enlargement Nonspecific ST and T wave abnormality Abnormal ECG No significant change was found Confirmed by Addison Lank [id number]) on 03/25/2017 9:00:13 AM       Radiology Dg Chest 2 View  Result Date: 03/25/2017 CLINICAL DATA:  Shortness of breath EXAM: CHEST  2 VIEW COMPARISON:  Chest radiograph 08/28/2016 FINDINGS: Unchanged cardiomegaly. There is mild interstitial pulmonary edema. No pleural effusion or pneumothorax. No focal consolidation. IMPRESSION: Unchanged cardiomegaly with mild interstitial pulmonary edema. Electronically Signed   By: Ulyses Jarred M.D.   On: 03/25/2017 06:22    Procedures Procedures (including critical care time)  Medications Ordered in ED Medications  enoxaparin (LOVENOX) injection 40 mg (not administered)  sodium chloride flush (NS) 0.9 % injection 3 mL (not administered)  acetaminophen (TYLENOL) tablet 650 mg (not administered)    Or  acetaminophen (TYLENOL) suppository 650 mg (not administered)  carvedilol (COREG) tablet 25 mg (not administered)  hydrALAZINE (APRESOLINE) tablet 50 mg (50 mg Oral Given 03/25/17 1552)  sacubitril-valsartan (ENTRESTO) 97-103 mg per tablet (1 tablet Oral Given 03/25/17 1425)  escitalopram (LEXAPRO) tablet 20 mg (20 mg Oral Given 03/25/17 1426)  aspirin EC tablet 81 mg (81 mg Oral Given 03/25/17 1425)  furosemide (LASIX) injection 80 mg (not administered)  potassium chloride SA (K-DUR,KLOR-CON) CR tablet 40 mEq (40 mEq Oral Given 03/25/17 1425)  insulin aspart (novoLOG) injection 0-9 Units (3 Units Subcutaneous Given 03/25/17 1426)  insulin glargine (LANTUS) injection 10  Units (not administered)  carvedilol (COREG) tablet 25 mg (25 mg Oral Given 03/25/17 1052)  hydrALAZINE (APRESOLINE) tablet 50 mg (50 mg Oral Given 03/25/17 1053)  torsemide (DEMADEX) tablet 40 mg (40 mg Oral Given 03/25/17 1053)     Initial Impression / Assessment and Plan / ED Course  I have reviewed the triage vital signs and the nursing notes.  Pertinent labs & imaging results that were available during my care of the patient were reviewed by me and considered in my medical decision making (see chart for details).  Clinical Course as of Mar 25 1614  Fri Mar 25, 2017  1035 Spoke with Dr. Posey Pronto with teaching service, accepting admission.  [JR]    Clinical Course User Index [JR] Epiphany Seltzer, [place] N, PA-C    Pt presenting with symptoms consistent with CHF exacerbation. Pt with peripheral edema and pulmonary edema on CXR. BNP 784.7, Trop neg, Cr wnl. EKG unremarkable. Pt also hypertensive, however has not taken any morning medications; administered those meds here in ED. Spoke with Dr. Posey Pronto with teaching service, accepting admission under Dr. Lynnae January for IV diuresis.  Patient discussed with and seen by Dr. Leonette Monarch, who agrees with admission.  The patient appears reasonably stabilized for admission considering the current resources, flow, and capabilities available in the ED at this time, and I doubt any other [facility] requiring further screening and/or treatment in the ED prior to admission.   Final Clinical Impressions(s) / ED Diagnoses   Final diagnoses:  Acute on chronic systolic congestive heart failure [facility])    ED Discharge Orders    None       Aiana Nordquist, [place] N, PA-C 03/25/17 1615    Fatima Blank, MD 03/30/17 0003

## 2017-03-25 NOTE — H&P (Signed)
Date: 03/25/2017               Patient Name:  Chelsey Santiago MRN: [id number]  DOB: Feb 08, 1976 Age / Sex: 41 y.o., female   PCP: Rosita Fire, MD         Medical Service: Internal Medicine Teaching Service         Attending Physician: Dr. Bartholomew Crews, MD    First Contact: Dr. Frederico Hamman Pager: [phone]  Second Contact: Dr. Philipp Ovens Pager: [phone]       After Hours (After 5p/  First Contact Pager: [phone]  weekends / holidays): Second Contact Pager: [phone]   Chief Complaint: Dyspnea  History of Present Illness:  Chelsey Santiago is a 41 year old female with PMH significant for HFrEF (EF 40-45%), HTN, T2DM, Obesity, and OSA who presents for 1 week of progressive dyspnea. She is accompanied by a friend.  Patient states she was in her usual state of health until 1 week ago when she began to have symptoms of shortness of breath, dyspnea on exertion, orthopnea, PND, nausea, vomiting, chest tightness, and increased lower extremity swelling. She reports adherence to her medications including Torsemide 40 mg am and 20 mg pm as well as Carvedilol, Entresto, and Hydralazine. She denies any palpitations, headache, change in vision, loss of consciousness, or diarrhea.  She says she is normally able to walk up to 3 blocks without issue, but has only been able to walk to the driveway from her door before feeling short of breath. She says she normally does not have swelling in her legs as it is well-controlled with her Torsemide. She reports good urine output with the assistance of torsemide. She has been trying to limit her fluid intake to <1.5 L per day and avoid extra salt in diet. She does eat occasional frozen foods but denies any recent alteration in her diet. She denies any recent illness. She reports significant stress at home from her home life and children.   In the ED, her initial vitals were: BP (!) 216/149   Pulse (!) 107   Temp 97.8 F (36.6 C)   Resp 20   SpO2 98%    Initial lab work was notable for I-stat Trop 0.01, BNP 784 (prior 725 << 2932) K 3.3, Cr 0.83, WBC 11.0, Hgb 10.2, MCV 75, Platelets 217. She was given oral Carvedilol 25 mg, Hydralazine 50 mg, and Torsemide 40 mg once with improvement in her BP to 937-169C systolic.     Meds:  Current Meds  Medication Sig  . aspirin EC 81 MG tablet Take 81 mg by mouth daily.  . carvedilol (COREG) 25 MG tablet Take 1 tablet (25 mg total) by mouth 2 (two) times daily with a meal.  . escitalopram (LEXAPRO) 20 MG tablet Take 1 tablet (20 mg total) by mouth daily.  Marland Kitchen gabapentin (NEURONTIN) 300 MG capsule Take 300 mg by mouth at bedtime.  . hydrALAZINE (APRESOLINE) 50 MG tablet Take 1 tablet (50 mg total) by mouth 3 (three) times daily.  . insulin glargine (LANTUS) 100 UNIT/ML injection Inject 30 Units at bedtime into the skin.   . Lancets (FREESTYLE) lancets Use as instructed  . metFORMIN (GLUCOPHAGE) 500 MG tablet Take 500 mg 2 (two) times daily with a meal by mouth.  . ondansetron (ZOFRAN) 4 MG tablet Take 1 tablet (4 mg total) by mouth every 8 (eight) hours as needed for nausea.  . potassium chloride SA (K-DUR,KLOR-CON) 20 MEQ tablet Take 1  tablet (20 mEq total) by mouth daily.  . sacubitril-valsartan (ENTRESTO) 97-103 MG Take 1 tablet by mouth 2 (two) times daily.  Marland Kitchen torsemide (DEMADEX) 20 MG tablet Please take 40 mg oral every morning and 20 mg every evening. (Patient taking differently: Take 20-40 mg 2 (two) times daily by mouth. Please take 40 mg oral every morning and 20 mg every evening.)     Allergies: Allergies as of 03/25/2017 - Review Complete 03/25/2017  Allergen Reaction Noted  . Diclofenac Swelling 07/12/2011  . Tramadol Nausea And Vomiting 05/29/2011  . Vicodin [hydrocodone-acetaminophen] Itching and Nausea Only 05/29/2011   Past Medical History:  Diagnosis Date  . Anemia    H&H of 10.6/33 and 07/2008 and 11.9/35 and 09/2010  . Anxiety   . CHF (congestive heart failure) ([place])   .  Depression with anxiety   . Diabetes mellitus without complication ([employer])   . Enlarged heart   . Fasting hyperglycemia   . Hypertension    Lab: Normal BMet except glucose of 118 in 09/2010  . Hypertensive heart disease 2009   Pulmonary edema postpartum; mild to moderate mitral regurgitation when hospitalized for CHF in 2009; Echocardiogram in 12/2009-no MR and normal EF; normal CXR in 09/2010  . Migraine headache   . Miscarriage 03/19/2013  . Obesity 04/16/2009  . Osteoarthritis, knee 03/29/2011  . Preeclampsia   . Pregnant   . Pulmonary edema   . Sleep apnea   . Threatened abortion in early pregnancy 03/15/2013    Family History:  Family History  Problem Relation Age of Onset  . Diabetes Mother   . Heart disease Mother   . Hyperlipidemia Paternal Grandfather   . Hypertension Paternal Grandfather   . Heart disease Father   . Heart disease Maternal Grandmother   . ADD / ADHD Son   . Hypertension Maternal Uncle   . Heart attack Brother   . Sudden death Neg Hx      Social History:  Social History   Socioeconomic History  . Marital status: Single    Spouse name: None  . Number of children: None  . Years of education: None  . Highest education level: None  Social Needs  . Financial resource strain: None  . Food insecurity - worry: None  . Food insecurity - inability: None  . Transportation needs - medical: None  . Transportation needs - non-medical: None  Occupational History  . Occupation: unemployed    Fish farm manager: UNEMPLOYED  Tobacco Use  . Smoking status: Never Smoker  . Smokeless tobacco: Never Used  Substance and Sexual Activity  . Alcohol use: Yes    Comment: occ  . Drug use: No  . Sexual activity: Not Currently    Birth control/protection: Surgical    Comment: tubal  Other Topics Concern  . None  Social History Narrative   Lives in [place]   Engaged/boyfriend (father to youngest chid)   4 children: daughter (88 as of 2013), sons (20, 38, 78 as of 2013)    Religion: christian     Review of Systems: A complete ROS was negative except as per HPI.   Physical Exam: Blood pressure (!) 147/87, pulse 85, temperature 97.8 F (36.6 C), resp. rate 18, SpO2 99 %. Physical Exam  Constitutional: She is oriented to person, place, and time. She appears well-developed and well-nourished. She does not appear ill. No distress.  Obese woman, eating a sandwich  HENT:  Head: Normocephalic and atraumatic.  Neck: Neck supple.  Cardiovascular: Regular rhythm.  Tachycardia present.    No systolic murmur is present. Pulmonary/Chest: Effort normal. No accessory muscle usage. No tachypnea. No respiratory distress. She has no wheezes. She has no rales.  Abdominal: Soft. She exhibits no distension. There is no tenderness.  Musculoskeletal:  +1 pitting edema both legs. Left chest wall tender to palpation.  Neurological: She is alert and oriented to person, place, and time.  Skin: Skin is warm.  Psychiatric: She has a normal mood and affect.     EKG: personally reviewed my interpretation is sinus tach, rate 102 bpm, early repol changes V2-4, nonspecific Twave changes V6, no acute ischemic changes  CXR: personally reviewed my interpretation is cardiomegaly, mild increased vascular markings. No effusion or consolidation.  Assessment & Plan by Problem: Active Problems:   OSA (obstructive sleep apnea)   Hypertensive urgency   Acute on chronic combined systolic and diastolic CHF, NYHA class 4 (HCC)   Diabetes mellitus with complication ([place])  41 year old female with PMH significant for HFrEF (EF 40-45%), HTN, T2DM, Obesity, and OSA admitted for acute on chronic CHF exacerbation in setting of uncontrolled hypertension.  Acute on Chronic CHF Exacerbation in setting of uncontrolled HTN: Most likely cause of her symptoms is uncontrolled hypertension resulting in exacerbation of her CHF. She reports adherence to her medications and reports significant stress at home  which she feels has been driving her blood pressure up. Her BP has improved with her usual morning oral medications. She denies any drug use or recent illness to trigger her symptoms. She does report atypical chest tightness which may be musculoskeletal as she is tender to palpation over her left chest wall. Will trend troponins, resume home Entresto, Coreg, Hydralazine, and diurese with IV Lasix. - IV Lasix 80 mg BID - Daily weights; Strict I/Os - Trend Troponin - Continue home Entresto 97-103 mg BID, Coreg 25 mg BID, Hydralazine 50 mg TID - Supplement K 40 mEq BID for now - Monitor BMET, Check Mg in am - UDS  T2DM: Last A1c on file 10.3 (04/03/16). - Lantus 10 units qhs - SSI-S  OSA: CPAP qhs   Dispo: Admit patient to Inpatient with expected length of stay greater than 2 midnights.  Signed: Zada Finders, MD 03/25/2017, 1:00 PM

## 2017-03-25 NOTE — Progress Notes (Signed)
Patient trasfered from ED to [phone] via wheelchair; alert and oriented x 4; no complaints of pain; IV saline locked in RH; skin intact. Orient patient to room and unit; gave patient care guide; instructed how to use the call bell and  fall risk precautions. Will continue to monitor the patient.

## 2017-03-25 NOTE — Progress Notes (Signed)
Placed patient on CPAP for the night via auto-mode.  

## 2017-03-25 NOTE — ED Provider Notes (Signed)
Medical screening examination/treatment/procedure(s) were conducted as a shared visit with non-physician practitioner(s) and myself.  I personally evaluated the patient during the encounter. Briefly, the patient is a 41 y.o. female w/ h/o CHF here with symptoms consistent of CHF exacerbation.  Exam with peripheral edema.  Labs reaffirming diagnosis.  Will admit patient for IV diuresing..    EKG Interpretation  Date/Time:  Friday March 25 2017 05:59:22 EST Ventricular Rate:  101 PR Interval:  158 QRS Duration: 94 QT Interval:  384 QTC Calculation: 497 R Axis:   26 Text Interpretation:  Sinus tachycardia Right atrial enlargement Nonspecific ST and T wave abnormality Abnormal ECG No significant change was found Confirmed by Addison Lank [phone]) on 03/25/2017 9:00:13 AM           Leonette Monarch Grayce Sessions, MD 03/25/17 1215

## 2017-03-26 DIAGNOSIS — E118 Type 2 diabetes mellitus with unspecified complications: Secondary | ICD-10-CM

## 2017-03-26 DIAGNOSIS — I110 Hypertensive heart disease with heart failure: Secondary | ICD-10-CM | POA: Diagnosis not present

## 2017-03-26 DIAGNOSIS — I5023 Acute on chronic systolic (congestive) heart failure: Secondary | ICD-10-CM

## 2017-03-26 DIAGNOSIS — I160 Hypertensive urgency: Secondary | ICD-10-CM | POA: Diagnosis not present

## 2017-03-26 DIAGNOSIS — Z885 Allergy status to narcotic agent status: Secondary | ICD-10-CM

## 2017-03-26 DIAGNOSIS — Z886 Allergy status to analgesic agent status: Secondary | ICD-10-CM | POA: Diagnosis not present

## 2017-03-26 DIAGNOSIS — I5043 Acute on chronic combined systolic (congestive) and diastolic (congestive) heart failure: Secondary | ICD-10-CM | POA: Diagnosis not present

## 2017-03-26 DIAGNOSIS — G4733 Obstructive sleep apnea (adult) (pediatric): Secondary | ICD-10-CM | POA: Diagnosis not present

## 2017-03-26 LAB — HIV ANTIBODY (ROUTINE TESTING W REFLEX): HIV Screen 4th Generation wRfx: NONREACTIVE

## 2017-03-26 LAB — BASIC METABOLIC PANEL
Anion gap: 10 (ref 5–15)
BUN: 13 mg/dL (ref 6–20)
CALCIUM: 8.3 mg/dL — AB (ref 8.9–10.3)
CO2: 27 mmol/L (ref 22–32)
CREATININE: 0.87 mg/dL (ref 0.44–1.00)
Chloride: 99 mmol/L — ABNORMAL LOW (ref 101–111)
Glucose, Bld: 245 mg/dL — ABNORMAL HIGH (ref 65–99)
Potassium: 3.4 mmol/L — ABNORMAL LOW (ref 3.5–5.1)
SODIUM: 136 mmol/L (ref 135–145)

## 2017-03-26 LAB — GLUCOSE, CAPILLARY
GLUCOSE-CAPILLARY: 207 mg/dL — AB (ref 65–99)
GLUCOSE-CAPILLARY: 211 mg/dL — AB (ref 65–99)

## 2017-03-26 LAB — CBC
HCT: 33.7 % — ABNORMAL LOW (ref 36.0–46.0)
Hemoglobin: 10.5 g/dL — ABNORMAL LOW (ref 12.0–15.0)
MCH: 23.3 pg — AB (ref 26.0–34.0)
MCHC: 31.2 g/dL (ref 30.0–36.0)
MCV: 74.7 fL — ABNORMAL LOW (ref 78.0–100.0)
PLATELETS: 223 10*3/uL (ref 150–400)
RBC: 4.51 MIL/uL (ref 3.87–5.11)
RDW: 15.2 % (ref 11.5–15.5)
WBC: 9.7 10*3/uL (ref 4.0–10.5)

## 2017-03-26 LAB — MAGNESIUM: MAGNESIUM: 1.4 mg/dL — AB (ref 1.7–2.4)

## 2017-03-26 LAB — TROPONIN I: Troponin I: <0.03 ng/mL (ref <0.03)

## 2017-03-26 MED ORDER — MAGNESIUM SULFATE 2 GM/50ML IV SOLN
2.0000 g | Freq: Once | INTRAVENOUS | Status: AC
  Administered 2017-03-26: 2 g via INTRAVENOUS
  Filled 2017-03-26: qty 50

## 2017-03-26 MED ORDER — TORSEMIDE 20 MG PO TABS: 20.0000 mg | ORAL_TABLET | Freq: Two times a day (BID) | ORAL | Status: DC

## 2017-03-26 NOTE — Discharge Instructions (Addendum)
You were admitted to [facility] due to a heart failure exacerbation.  You received diuretics through the IV which helped with your shortness of breath.  Please continue taking your home medications as usual.  Do not take your morning dose of torsemide today.  Please do to take your nighttime torsemide today. You can continue taking it as usual starting tomorrow.   Make sure to monitor your blood pressure at home and call your doctor and come to the ED if the top number is >180.   Continue to weigh the yourself at home every day please call your cardiologist.  If your weight is of by more than 4- 5 pounds.  Please avoid foods with high salt/sodium content.  Please make sure to follow-up with your regular doctor and your cardiologist.

## 2017-03-26 NOTE — Care Management Note (Signed)
Case Management Note  Patient Details  Name: Chelsey Santiago MRN: [id number] Date of Birth: 11-09-1975  Subjective/Objective:   Pt admitted with dyspnea  Action/Plan:  PTA independent from home.  Pt has PCP and denied barriers to obtaining and paying for medications (including Entresto).  No CM needs determined, pt has discharge orders,  CM signing off   Expected Discharge Date:  03/26/17               Expected Discharge Plan:  Home/Self Care  In-House Referral:     Discharge planning Services  CM Consult  Post Acute Care Choice:    Choice offered to:     DME Arranged:    DME Agency:     HH Arranged:    HH Agency:     Status of Service:     If discussed at [employer], dates discussed:    Additional Comments:  Maryclare Labrador, RN 03/26/2017, 11:29 AM

## 2017-03-26 NOTE — Progress Notes (Signed)
Chelsey Santiago to be D/C'd Home per MD order. Discussed with the patient and all questions fully answered.  Allergies as of 03/26/2017      Reactions   Diclofenac Swelling   AND POSSIBLE SYNCOPE; tolerates ibuprofen per pt   Tramadol Nausea And Vomiting   Itching (12/21); tolerates ibuprofen per pt   Vicodin [hydrocodone-acetaminophen] Itching, Nausea Only      Medication List    STOP taking these medications   ondansetron 4 MG tablet Commonly known as:  ZOFRAN     TAKE these medications   aspirin EC 81 MG tablet Take 81 mg by mouth daily.   carvedilol 25 MG tablet Commonly known as:  COREG Take 1 tablet (25 mg total) by mouth 2 (two) times daily with a meal.   escitalopram 20 MG tablet Commonly known as:  LEXAPRO Take 1 tablet (20 mg total) by mouth daily.   freestyle lancets Use as instructed   gabapentin 300 MG capsule Commonly known as:  NEURONTIN Take 300 mg by mouth at bedtime.   hydrALAZINE 50 MG tablet Commonly known as:  APRESOLINE Take 1 tablet (50 mg total) by mouth 3 (three) times daily.   insulin glargine 100 UNIT/ML injection Commonly known as:  LANTUS Inject 30 Units at bedtime into the skin.   metFORMIN 500 MG tablet Commonly known as:  GLUCOPHAGE Take 500 mg 2 (two) times daily with a meal by mouth.   nitroGLYCERIN 0.4 MG SL tablet Commonly known as:  NITROSTAT Place 1 tablet (0.4 mg total) under the tongue every 5 (five) minutes as needed for chest pain.   potassium chloride SA 20 MEQ tablet Commonly known as:  K-DUR,KLOR-CON Take 1 tablet (20 mEq total) by mouth daily.   sacubitril-valsartan 97-103 MG Commonly known as:  ENTRESTO Take 1 tablet by mouth 2 (two) times daily. What changed:  Another medication with the same name was removed. Continue taking this medication, and follow the directions you see here.   torsemide 20 MG tablet Commonly known as:  DEMADEX Please take 40 mg oral every morning and 20 mg every evening. What  changed:    how much to take  how to take this  when to take this  additional instructions       VVS, Skin clean, dry and intact without evidence of skin break down, no evidence of skin tears noted.  IV catheter discontinued intact. Site without signs and symptoms of complications. Dressing and pressure applied.  An After Visit Summary was printed and given to the patient.  Patient escorted via [place], and D/C home via private auto.  Chelsey Santiago  03/26/2017 1:48 PM

## 2017-03-26 NOTE — Discharge Summary (Signed)
Name: Chelsey Santiago MRN: [id number] DOB: 09/15/1975 41 y.o. PCP: Rosita Fire, MD  Date of Admission: 03/25/2017  8:08 AM Date of Discharge: 03/26/2017 Attending Physician: Bartholomew Crews, MD  Discharge Diagnosis: 1. Hypertensive urgency  2. Acute on chronic combined systolic and diastolic HF   Active Problems:   OSA (obstructive sleep apnea)   Hypertensive urgency   Acute on chronic combined systolic and diastolic CHF, NYHA class 4 (HCC)   Diabetes mellitus with complication [facility])   Discharge Medications: Allergies as of 03/26/2017      Reactions   Diclofenac Swelling   AND POSSIBLE SYNCOPE; tolerates ibuprofen per pt   Tramadol Nausea And Vomiting   Itching (12/21); tolerates ibuprofen per pt   Vicodin [hydrocodone-acetaminophen] Itching, Nausea Only      Medication List    STOP taking these medications   ondansetron 4 MG tablet Commonly known as:  ZOFRAN     TAKE these medications   aspirin EC 81 MG tablet Take 81 mg by mouth daily.   carvedilol 25 MG tablet Commonly known as:  COREG Take 1 tablet (25 mg total) by mouth 2 (two) times daily with a meal.   escitalopram 20 MG tablet Commonly known as:  LEXAPRO Take 1 tablet (20 mg total) by mouth daily.   freestyle lancets Use as instructed   gabapentin 300 MG capsule Commonly known as:  NEURONTIN Take 300 mg by mouth at bedtime.   hydrALAZINE 50 MG tablet Commonly known as:  APRESOLINE Take 1 tablet (50 mg total) by mouth 3 (three) times daily.   insulin glargine 100 UNIT/ML injection Commonly known as:  LANTUS Inject 30 Units at bedtime into the skin.   metFORMIN 500 MG tablet Commonly known as:  GLUCOPHAGE Take 500 mg 2 (two) times daily with a meal by mouth.   nitroGLYCERIN 0.4 MG SL tablet Commonly known as:  NITROSTAT Place 1 tablet (0.4 mg total) under the tongue every 5 (five) minutes as needed for chest pain.   potassium chloride SA 20 MEQ tablet Commonly known as:   K-DUR,KLOR-CON Take 1 tablet (20 mEq total) by mouth daily.   sacubitril-valsartan 97-103 MG Commonly known as:  ENTRESTO Take 1 tablet by mouth 2 (two) times daily. What changed:  Another medication with the same name was removed. Continue taking this medication, and follow the directions you see here.   torsemide 20 MG tablet Commonly known as:  DEMADEX Please take 40 mg oral every morning and 20 mg every evening. What changed:    how much to take  how to take this  when to take this  additional instructions       Disposition and follow-up:   Ms.Teisha Rodena Medin was discharged from [facility] in Good condition.  At the hospital follow up visit please address:  1.  Please assess compliance with heart failure medications and low sodium diet.   2.  Labs / imaging needed at time of follow-up: None   3.  Pending labs/ test needing follow-up: None   Follow-up Appointments: Follow-up Information    Rosita Fire, MD. Schedule an appointment as soon as possible for a visit in 7 day(s).   Specialty:  Internal Medicine Why:  Please schedule follow-up appointment with your regular doctor Contact information: [place] [address] [phone]           Hospital Course by problem list:   1. Hypertensive urgency: Patient presented with BP 216/149 as  well as dyspnea on exertion, orthopnea, PND, and lower extremity edema.  She reported compliance with home medications, however endorses significant stress at home and unclear if she has truly missed doses.  In the ED she was given p.o. carvedilol, hydralazine, and torsemide (home meds) with improvement in BP (150-170).  She did not require IV medications and her BP remained stable during admission while on her home medications.  2. Acute on chronic systolic heart failure: Patient presented with dyspnea on exertion, orthopnea, PND, pulmonary edema, and lower extremity edema of 1 week  duration.  Weight on presentation to 246 lbs. Etiology unclear at this time as no signs or evidence of infection, arrhythmia, or ACS.  Possibly medication noncompliance in setting of stressful environment at home.  She was diuresed with 2 doses of IV Lasix and responded well. Symptoms resolved on day 2 of admission and resumption of all her home medications.  Weight on discharge 240 lbs.    Discharge Vitals:   BP (!) 146/91 (BP Location: Right Arm)   Pulse 84   Temp 98.2 F (36.8 C) (Oral)   Resp 20   Ht 5\' 6"  (1.676 m)   Wt 240 lb 1.6 oz (108.9 kg)   SpO2 98%   BMI 38.75 kg/m   Pertinent Labs, Studies, and Procedures:   BNP    Component Value Date/Time   BNP 784.7 (H) 03/25/2017 0165    CXR: FINDINGS: Unchanged cardiomegaly. There is mild interstitial pulmonary edema. No pleural effusion or pneumothorax. No focal consolidation.  Discharge Instructions: Discharge Instructions    (HEART FAILURE PATIENTS) Call MD:  Anytime you have any of the following symptoms: 1) 3 pound weight gain in 24 hours or 5 pounds in 1 week 2) shortness of breath, with or without a dry hacking cough 3) swelling in the hands, feet or stomach 4) if you have to sleep on extra pillows at night in order to breathe.   Complete by:  As directed    Call MD for:  difficulty breathing, headache or visual disturbances   Complete by:  As directed    Call MD for:  extreme fatigue   Complete by:  As directed    Diet - low sodium heart healthy   Complete by:  As directed    Increase activity slowly   Complete by:  As directed       Signed: Welford Roche, MD  Internal Medicine PGY-1  P [phone]

## 2017-03-26 NOTE — Progress Notes (Signed)
   Subjective:  Patient reported headache overnight and requested Percocet for it.  Was given IV Toradol with resolution.  No other acute events overnight.  This morning patient states she was feeling very well and was ready to go home.  Denies shortness of breath and orthopnea.  States she has been able to walk to the bathroom without dyspnea on exertion.  Eager to go home.  Advised patient to not take her home torsemide this morning when she gets home given that she received Lasix this morning.  Objective:  Vital signs in last 24 hours: Vitals:   03/25/17 2259 03/25/17 2311 03/26/17 0505 03/26/17 0519  BP:   (!) 146/91   Pulse: 78  84   Resp: 18 20 20    Temp:   98.2 F (36.8 C)   TempSrc:   Oral   SpO2: 98% 100% 98%   Weight:    240 lb 1.6 oz (108.9 kg)  Height:       General: Very pleasant female, obese, well-nourished, sitting up in bed in no acute distress Cardiac: regular rate and rhythm, nl S1/S2, no murmurs, rubs or gallops, no JVD  Pulm: CTAB, no wheezes or crackles, no increased work of breathing  Abd: soft, NTND, bowel sounds present  Neuro: A&Ox3, able to move all 4 extremities with no focal deficits noted Ext: warm and well perfused, trace pitting edema bilaterally improved from yesterday   Assessment/Plan:   41 year old female with PMH significant for HFrEF (EF 40-45%), HTN, T2DM, Obesity, and OSA admitted for acute on chronic CHF exacerbation in setting of uncontrolled hypertension.   # Acute on Chronic CHF Exacerbation in setting of uncontrolled HTN: Patient presented with shortness of breath in the setting of systolic BP > 585. BNP 784. Reported compliance with home medications, including torsemide 40 AM and 20 PM.  However, reports increased stressors at home, and I question her medication compliance.  She also states her blood pressure significantly elevates when she is under stress. UOP net -1.2L s/p IV lasix 80 mg x1.  She also received another dose of IV Lasix  80 this morning.   Currently feeling well and denies shortness of breath.  Appears euvolemic on exam.  Stable for discharge. -D/c IV Lasix.  Patient instructed to hold morning dose of torsemide today once discharge.  She is to resume her home medications tonight. -Daily weights; Strict I/Os -Continue home Entresto 97-103 mg BID, Coreg 25 mg BID, Hydralazine 50 mg TID -Supplement K 40 mEq BID for now -Anticipate discharge today  # Hypomagnesemia: Patient's magnesium 1.4 on admission.  QTc prolonged 512 ms on EKG.  Patient did report chest tightness that was not reducible on palpation and thought to be MSK nature.  Troponin negative x3.  Low suspicion for cardiac etiology. - Repleting  - Follow up as outpatient   # T2DM: Last A1c on file 10.3 (04/03/16). - Lantus 10 units qhs - SSI-S  # OSA: - CPAP qhs  F: none  E: monitoring and repleting as needed N: HH diet   VTE ppx: SQ lovenox   Code status: Full code    Dispo: Anticipated discharge today.   Welford Roche, MD  Internal Medicine PGY-1  P [phone]

## 2017-03-26 NOTE — Progress Notes (Signed)
  Date: 03/26/2017  Patient name: Chelsey Santiago  Medical record number: [id number]  Date of birth: 09-15-1975   I have seen and evaluated Chelsey Santiago and discussed their care with the Residency Team. MS Chelsey Santiago is a 41 yo female with nonischemic heart failure with an EF of 40%, hypertension, and type 2 diabetes. She presented with a chief complaint of one week of progressive dyspnea, dyspnea on exertion, orthopnea, PND, lower extremity edema, chest tightness, nausea, and vomiting. She denies missed medication doses or dietary indiscretion.  On admission, her blood pressure was 216/149. She was treated with oral carvedilol, hydralazine, and oral torsemide and her blood pressure improved. She was given Lasix 80 IV twice a day and received 2 doses. Her weight decreased 6 pounds and she is not -1.2 L. This morning, she feels great with the resolution of her symptoms and is ready to go home.   Vitals:   03/25/17 2311 03/26/17 0505  BP:  (!) 146/91  Pulse:  84  Resp: 20 20  Temp:  98.2 F (36.8 C)  SpO2: 100% 98%  T 98.2 HR 84 146/91 Gen NAD, sitting SOB, speaking in full sentences LCTAB with good air flow, no crackles LE trace edema B  Cr 0.87 BNP 785 HgB 10.5 stable MCV 75 RDW 15.2 Trop - x3  I personally viewed the CXR images and confirmed my reading with the official read. 2 view PA and lateral, good quality, CM, mild edema  I personally viewed the EKG and confirmed my reading with the official read. Sinus, LAD, LAE, no ischemic changes  Assessment and Plan: I have seen and evaluated the patient as outlined above. I agree with the formulated Assessment and Plan as detailed in the residents' note, with the following changes: Ms Chelsey Santiago is a 41 year old female with nonischemic systolic cardiomyopathy who presented with a hypertensive urgency and pulmonary edema. It is not certain chronic condition preceded the other whether they were simultaneous. No etiology was found - her  EKG and troponins ruled out an ACS, chest x-ray didn't show pneumonia which could have precipitated a heart failure exacerbation, she denies medication or dietary noncompliance, and her TSH is normal. With 2 doses of IV Lasix and resumption of her home medications, signs and symptoms of her hypertensive urgency and heart failure have resolved. She is stable for DC home on her home medication regimen   1. Acute on chronic systolic heart failure   2. Hypertensive urgency  Chelsey Crews, MD 11/10/201812:07 PM

## 2017-03-26 NOTE — Care Management (Addendum)
Per UR team reviewer [place] - pt is not appropriate for code 44 due to [facility]

## 2017-03-26 NOTE — Plan of Care (Signed)
Patient complained of headache and left neck pain overnight. Given Toradol, patient was able to sleep, no signs of distress. Removed cpap overnight, non labored breaths while resting. Team aware of bp, since trending downward no new orders at this time.

## 2017-04-12 ENCOUNTER — Other Ambulatory Visit (HOSPITAL_COMMUNITY): Payer: Self-pay | Admitting: Internal Medicine

## 2017-04-12 DIAGNOSIS — Z1231 Encounter for screening mammogram for malignant neoplasm of breast: Secondary | ICD-10-CM

## 2017-04-20 ENCOUNTER — Ambulatory Visit (HOSPITAL_COMMUNITY)
Admission: RE | Admit: 2017-04-20 | Discharge: 2017-04-20 | Disposition: A | Discharge status: Home / Self Care | Payer: Medicaid Other | Source: Ambulatory Visit | Attending: Internal Medicine | Admitting: Internal Medicine

## 2017-04-20 DIAGNOSIS — Z1231 Encounter for screening mammogram for malignant neoplasm of breast: Secondary | ICD-10-CM

## 2017-04-28 ENCOUNTER — Other Ambulatory Visit: Payer: Self-pay

## 2017-04-28 ENCOUNTER — Emergency Department (HOSPITAL_COMMUNITY)
Admission: EM | Admit: 2017-04-28 | Discharge: 2017-04-28 | Disposition: A | Discharge status: Home / Self Care | Payer: Medicaid Other | Attending: Emergency Medicine | Admitting: Emergency Medicine

## 2017-04-28 ENCOUNTER — Encounter (HOSPITAL_COMMUNITY): Payer: Self-pay | Admitting: *Deleted

## 2017-04-28 DIAGNOSIS — Z5321 Procedure and treatment not carried out due to patient leaving prior to being seen by health care provider: Secondary | ICD-10-CM | POA: Insufficient documentation

## 2017-04-28 DIAGNOSIS — I10 Essential (primary) hypertension: Secondary | ICD-10-CM | POA: Diagnosis present

## 2017-04-28 NOTE — ED Notes (Signed)
Pt BP rechecked. 171/110.  Pt states "I am just going to go on home, ive been waiting too long".  Pt encouraged to stay and be seen but pt insisted on leaving.

## 2017-04-28 NOTE — ED Triage Notes (Addendum)
Pt was seen at her PCP office today and found to have elevated BP. Pt's BP 190/140 in office. Pt c/o seeing spots, headache, dizziness that started this morning. Pt reports SOB and mid chest pain with no radiation that started yesterday. BP 179/107 in triage. Pt has hx of HTN and has been compliant with medications.

## 2017-05-03 ENCOUNTER — Ambulatory Visit (INDEPENDENT_AMBULATORY_CARE_PROVIDER_SITE_OTHER): Payer: Medicaid Other

## 2017-05-03 ENCOUNTER — Encounter: Payer: Self-pay | Admitting: Orthopaedic Surgery

## 2017-05-03 ENCOUNTER — Ambulatory Visit: Payer: Medicaid Other | Admitting: Orthopaedic Surgery

## 2017-05-03 VITALS — BP 141/91 | HR 82 | Temp 97.1°F | Ht 66.0 in | Wt 240.0 lb

## 2017-05-03 DIAGNOSIS — G8929 Other chronic pain: Secondary | ICD-10-CM

## 2017-05-03 DIAGNOSIS — M25561 Pain in right knee: Secondary | ICD-10-CM

## 2017-05-03 NOTE — Progress Notes (Signed)
Subjective:    Patient ID: Chelsey Santiago, female    DOB: April 13, 1976, 41 y.o.   MRN: [id number]  HPI She has bilateral knee pain, more on the right for years.  She has had problems since 2013.  She has swelling, popping, giving way and locking.  She has had MRI in the past and X-rays in the past.  She had been followed by [employer] in the past. She has had injections in the knee in the past. She has seen Dr. Legrand Rams recently and referred here.   She is not taking any NSAID now but has been on multiple ones in the past. She has been told by her heart doctor to avoid NSAIDs.  She has used ice, heat and rubs with no help.  She has been told she has bone on bone.   Review of Systems  HENT: Negative for congestion.   Respiratory: Negative for cough and shortness of breath.   Cardiovascular: Negative for chest pain and leg swelling.  Endocrine: Positive for cold intolerance.  Musculoskeletal: Positive for arthralgias, gait problem and joint swelling.  Allergic/Immunologic: Positive for environmental allergies.  Neurological: Positive for headaches.  Psychiatric/Behavioral: The patient is nervous/anxious.   All other systems reviewed and are negative.  Past Medical History:  Diagnosis Date  . Anemia    H&H of 10.6/33 and 07/2008 and 11.9/35 and 09/2010  . Anxiety   . CHF (congestive heart failure) ([place])   . Depression with anxiety   . Diabetes mellitus without complication ([place])   . Enlarged heart   . Fasting hyperglycemia   . Hypertension    Lab: Normal BMet except glucose of 118 in 09/2010  . Hypertensive heart disease 2009   Pulmonary edema postpartum; mild to moderate mitral regurgitation when hospitalized for CHF in 2009; Echocardiogram in 12/2009-no MR and normal EF; normal CXR in 09/2010  . Migraine headache   . Miscarriage 03/19/2013  . Obesity 04/16/2009  . Osteoarthritis, knee 03/29/2011  . Preeclampsia   . Pregnant   . Pulmonary edema   . Sleep apnea   . Threatened  abortion in early pregnancy 03/15/2013    Past Surgical History:  Procedure Laterality Date  . BREAST REDUCTION SURGERY  2002  . CARDIAC CATHETERIZATION N/A 12/22/2015   Procedure: Left Heart Cath and Coronary Angiography;  Surgeon: Peter M Martinique, MD;  Location: [facility];  Service: Cardiovascular;  Laterality: N/A;  . CESAREAN SECTION N/A 04/09/2014   Procedure: CESAREAN SECTION;  Surgeon: Mora Bellman, MD;  Location: [facility];  Service: Obstetrics;  Laterality: N/A;  . CHOLECYSTECTOMY      Current Outpatient Medications on File Prior to Visit  Medication Sig Dispense Refill  . aspirin EC 81 MG tablet Take 81 mg by mouth daily.    . carvedilol (COREG) 25 MG tablet Take 1 tablet (25 mg total) by mouth 2 (two) times daily with a meal. 60 tablet 12  . escitalopram (LEXAPRO) 20 MG tablet Take 1 tablet (20 mg total) by mouth daily. 30 tablet 6  . gabapentin (NEURONTIN) 300 MG capsule Take 300 mg by mouth at bedtime.    . hydrALAZINE (APRESOLINE) 50 MG tablet Take 1 tablet (50 mg total) by mouth 3 (three) times daily. 90 tablet 0  . insulin glargine (LANTUS) 100 UNIT/ML injection Inject 30 Units at bedtime into the skin.     . Lancets (FREESTYLE) lancets Use as instructed 100 each 12  . metFORMIN (GLUCOPHAGE) 500 MG tablet Take 500  mg 2 (two) times daily with a meal by mouth.    . nitroGLYCERIN (NITROSTAT) 0.4 MG SL tablet Place 1 tablet (0.4 mg total) under the tongue every 5 (five) minutes as needed for chest pain. 30 tablet 12  . potassium chloride SA (K-DUR,KLOR-CON) 20 MEQ tablet Take 1 tablet (20 mEq total) by mouth daily. 30 tablet 0  . sacubitril-valsartan (ENTRESTO) 97-103 MG Take 1 tablet by mouth 2 (two) times daily. 60 tablet 0  . torsemide (DEMADEX) 20 MG tablet Please take 40 mg oral every morning and 20 mg every evening. (Patient taking differently: Take 20-40 mg 2 (two) times daily by mouth. Please take 40 mg oral every morning and 20 mg every evening.) 90 tablet 0   No  current facility-administered medications on file prior to visit.     Social History   Socioeconomic History  . Marital status: Married    Spouse name: Not on file  . Number of children: Not on file  . Years of education: Not on file  . Highest education level: Not on file  Social Needs  . Financial resource strain: Not on file  . Food insecurity - worry: Not on file  . Food insecurity - inability: Not on file  . Transportation needs - medical: Not on file  . Transportation needs - non-medical: Not on file  Occupational History  . Occupation: unemployed    Fish farm manager: UNEMPLOYED  Tobacco Use  . Smoking status: Never Smoker  . Smokeless tobacco: Never Used  Substance and Sexual Activity  . Alcohol use: Yes    Comment: occ  . Drug use: No  . Sexual activity: Not Currently    Birth control/protection: Surgical    Comment: tubal  Other Topics Concern  . Not on file  Social History Narrative   Lives in [place]   Engaged/boyfriend (father to youngest chid)   4 children: daughter (81 as of 2013), sons (15, 48, 17 as of 2013)   Religion: christian    Family History  Problem Relation Age of Onset  . Diabetes Mother   . Heart disease Mother   . Hyperlipidemia Paternal Grandfather   . Hypertension Paternal Grandfather   . Heart disease Father   . Hypertension Father   . Heart disease Maternal Grandmother   . ADD / ADHD Son   . Hypertension Maternal Uncle   . Heart attack Brother   . Sudden death Neg Hx     BP (!) 141/91   Pulse 82   Temp (!) 97.1 F (36.2 C)   Ht 5\' 6"  (1.676 m)   Wt 240 lb (108.9 kg)   LMP 05/03/2017   BMI 38.74 kg/m      Objective:   Physical Exam  Constitutional: She is oriented to person, place, and time. She appears well-developed and well-nourished.  HENT:  Head: Normocephalic and atraumatic.  Eyes: Conjunctivae and EOM are normal. Pupils are equal, round, and reactive to light.  Neck: Normal range of motion. Neck supple.  Cardiovascular:  Normal rate, regular rhythm and intact distal pulses.  Pulmonary/Chest: Effort normal.  Abdominal: Soft.  Musculoskeletal: She exhibits tenderness (Right knee tender, slight effusion, slight crepitus, ROM 0 to 115, slight limp right, NV intact, left knee similar findings.).  Neurological: She is alert and oriented to person, place, and time. She displays normal reflexes. No cranial nerve deficit. She exhibits normal muscle tone. Coordination normal.  Skin: Skin is warm and dry.  Psychiatric: She has a normal mood and  affect. Her behavior is normal. Judgment and thought content normal.  Vitals reviewed.  X-rays were done of the right knee, reported separately.       Assessment & Plan:   Encounter Diagnosis  Name Primary?  . Chronic pain of right knee Yes   She will need another MRI but will have to wait for six weeks secondary to her insurance.  PROCEDURE NOTE:  The patient requests injections of the right knee , verbal consent was obtained.  The right knee was prepped appropriately after time out was performed.   Sterile technique was observed and injection of 1 cc of Depo-Medrol 40 mg with several cc's of plain xylocaine. Anesthesia was provided by ethyl chloride and a 20-gauge needle was used to inject the knee area. The injection was tolerated well.  A band aid dressing was applied.  The patient was advised to apply ice later today and tomorrow to the injection sight as needed.  Call if any problem.  Precautions discussed.   Return in one month.  I will give Rx for cane.  Electronically Signed Sanjuana Kava, MD 12/18/20189:21 AM

## 2017-05-05 ENCOUNTER — Encounter (HOSPITAL_COMMUNITY): Payer: Self-pay

## 2017-05-05 ENCOUNTER — Encounter (HOSPITAL_COMMUNITY): Payer: Medicaid Other | Attending: Oncology | Admitting: Oncology

## 2017-05-05 ENCOUNTER — Other Ambulatory Visit: Payer: Self-pay

## 2017-05-05 ENCOUNTER — Encounter (HOSPITAL_COMMUNITY): Payer: Medicaid Other

## 2017-05-05 VITALS — BP 179/98 | HR 80 | Temp 98.0°F | Resp 16 | Ht 66.0 in | Wt 238.3 lb

## 2017-05-05 DIAGNOSIS — D509 Iron deficiency anemia, unspecified: Secondary | ICD-10-CM | POA: Diagnosis not present

## 2017-05-05 DIAGNOSIS — N920 Excessive and frequent menstruation with regular cycle: Secondary | ICD-10-CM

## 2017-05-05 DIAGNOSIS — D500 Iron deficiency anemia secondary to blood loss (chronic): Secondary | ICD-10-CM | POA: Diagnosis present

## 2017-05-05 LAB — CBC WITH DIFFERENTIAL/PLATELET
BASOS PCT: 0 %
Basophils Absolute: 0 10*3/uL (ref 0.0–0.1)
EOS ABS: 0.1 10*3/uL (ref 0.0–0.7)
Eosinophils Relative: 1 %
HCT: 37.5 % (ref 36.0–46.0)
HEMOGLOBIN: 11.3 g/dL — AB (ref 12.0–15.0)
LYMPHS ABS: 2.1 10*3/uL (ref 0.7–4.0)
Lymphocytes Relative: 20 %
MCH: 22.9 pg — AB (ref 26.0–34.0)
MCHC: 30.1 g/dL (ref 30.0–36.0)
MCV: 76.1 fL — ABNORMAL LOW (ref 78.0–100.0)
Monocytes Absolute: 0.5 10*3/uL (ref 0.1–1.0)
Monocytes Relative: 5 %
NEUTROS PCT: 74 %
Neutro Abs: 7.9 10*3/uL — ABNORMAL HIGH (ref 1.7–7.7)
Platelets: 262 10*3/uL (ref 150–400)
RBC: 4.93 MIL/uL (ref 3.87–5.11)
RDW: 14.7 % (ref 11.5–15.5)
WBC: 10.6 10*3/uL — AB (ref 4.0–10.5)

## 2017-05-05 LAB — RETICULOCYTES
RBC.: 4.93 MIL/uL (ref 3.87–5.11)
RETIC COUNT ABSOLUTE: 69 10*3/uL (ref 19.0–186.0)
Retic Ct Pct: 1.4 % (ref 0.4–3.1)

## 2017-05-05 LAB — COMPREHENSIVE METABOLIC PANEL
ALBUMIN: 3.8 g/dL (ref 3.5–5.0)
ALK PHOS: 89 U/L (ref 38–126)
ALT: 12 U/L — AB (ref 14–54)
AST: 20 U/L (ref 15–41)
Anion gap: 13 (ref 5–15)
BUN: 22 mg/dL — AB (ref 6–20)
CALCIUM: 9.3 mg/dL (ref 8.9–10.3)
CO2: 30 mmol/L (ref 22–32)
CREATININE: 0.96 mg/dL (ref 0.44–1.00)
Chloride: 93 mmol/L — ABNORMAL LOW (ref 101–111)
GFR calc Af Amer: >60 mL/min (ref >60)
GFR calc non Af Amer: >60 mL/min (ref >60)
GLUCOSE: 288 mg/dL — AB (ref 65–99)
Potassium: 3.3 mmol/L — ABNORMAL LOW (ref 3.5–5.1)
SODIUM: 136 mmol/L (ref 135–145)
Total Bilirubin: 0.6 mg/dL (ref 0.3–1.2)
Total Protein: 8.6 g/dL — ABNORMAL HIGH (ref 6.5–8.1)

## 2017-05-05 LAB — FOLATE: Folate: 12.6 ng/mL (ref >5.9)

## 2017-05-05 LAB — IRON AND TIBC
Iron: 78 ug/dL (ref 28–170)
Saturation Ratios: 17 % (ref 10.4–31.8)
TIBC: 447 ug/dL (ref 250–450)
UIBC: 369 ug/dL

## 2017-05-05 LAB — FERRITIN: Ferritin: 19 ng/mL (ref 11–307)

## 2017-05-05 LAB — VITAMIN B12: VITAMIN B 12: 536 pg/mL (ref 180–914)

## 2017-05-05 NOTE — Progress Notes (Signed)
[facility] Initial Visit:  Patient Care Team: Rosita Fire, MD as PCP - General (Internal Medicine) Rothbart, Cristopher Estimable, MD (Cardiology) Florian Buff, MD as Attending Physician (Obstetrics and Gynecology)  CHIEF COMPLAINTS/PURPOSE OF CONSULTATION:  HISTORY OF PRESENTING ILLNESS: Chelsey Santiago 41 y.o. female presents today for evaluation of microcytic anemia.  Patient had CBC performed on 04/05/2017 which demonstrated WBC 8.5K, hemoglobin 9.8 g/dL, hematocrit 32%, MCV 74.8, platelet count 230k.  Her baseline hemoglobin from April 2018 was 10 g/dL.  She has a chronic microcytosis.  Patient states that she is chronically fatigued and feels like she has no energy.  She has constant ice cravings.  She denies any chest pain, shortness breath, abdominal pain, nausea, vomiting, diarrhea, focal weakness or recent infections.  She states that she used to have menorrhagia however since she had her tubal ligation in 2015, she states her periods have been lighter.  She states her normal menstrual cycle last about 4 days and her heaviest flow is during the first 2 days. She denies any other sources of bleeding including hemoptysis, hematuria, melena, hematochezia.  Review of Systems - Oncology ROS as per HPI otherwise 12 point ROS is negative.  MEDICAL HISTORY: Past Medical History:  Diagnosis Date  . Anemia    H&H of 10.6/33 and 07/2008 and 11.9/35 and 09/2010  . Anxiety   . CHF (congestive heart failure) ([place])   . Depression with anxiety   . Diabetes mellitus without complication ([place])   . Enlarged heart   . Fasting hyperglycemia   . Hypertension    Lab: Normal BMet except glucose of 118 in 09/2010  . Hypertensive heart disease 2009   Pulmonary edema postpartum; mild to moderate mitral regurgitation when hospitalized for CHF in 2009; Echocardiogram in 12/2009-no MR and normal EF; normal CXR in 09/2010  . Migraine headache   . Miscarriage 03/19/2013  . Obesity 04/16/2009  .  Osteoarthritis, knee 03/29/2011  . Preeclampsia   . Pregnant   . Pulmonary edema   . Sleep apnea   . Threatened abortion in early pregnancy 03/15/2013    SURGICAL HISTORY: Past Surgical History:  Procedure Laterality Date  . BREAST REDUCTION SURGERY  2002  . CARDIAC CATHETERIZATION N/A 12/22/2015   Procedure: Left Heart Cath and Coronary Angiography;  Surgeon: Peter M Martinique, MD;  Location: [facility];  Service: Cardiovascular;  Laterality: N/A;  . CESAREAN SECTION N/A 04/09/2014   Procedure: CESAREAN SECTION;  Surgeon: Mora Bellman, MD;  Location: [facility];  Service: Obstetrics;  Laterality: N/A;  . CHOLECYSTECTOMY      SOCIAL HISTORY: Social History   Socioeconomic History  . Marital status: Married    Spouse name: Not on file  . Number of children: Not on file  . Years of education: Not on file  . Highest education level: Not on file  Social Needs  . Financial resource strain: Not on file  . Food insecurity - worry: Not on file  . Food insecurity - inability: Not on file  . Transportation needs - medical: Not on file  . Transportation needs - non-medical: Not on file  Occupational History  . Occupation: unemployed    Fish farm manager: UNEMPLOYED  Tobacco Use  . Smoking status: Never Smoker  . Smokeless tobacco: Never Used  Substance and Sexual Activity  . Alcohol use: Yes    Comment: occ  . Drug use: No  . Sexual activity: Not Currently    Birth control/protection: Surgical  Comment: tubal  Other Topics Concern  . Not on file  Social History Narrative   Lives in [place]   Engaged/boyfriend (father to youngest chid)   4 children: daughter (32 as of 2013), sons (15, 12, 83 as of 2013)   Religion: christian    FAMILY HISTORY Family History  Problem Relation Age of Onset  . Diabetes Mother   . Heart disease Mother   . Hyperlipidemia Paternal Grandfather   . Hypertension Paternal Grandfather   . Heart disease Father   . Hypertension Father   . Heart disease  Maternal Grandmother   . ADD / ADHD Son   . Hypertension Maternal Uncle   . Heart attack Brother   . Sudden death Neg Hx     ALLERGIES:  is allergic to diclofenac; tramadol; and vicodin [hydrocodone-acetaminophen].  MEDICATIONS:  Current Outpatient Medications  Medication Sig Dispense Refill  . aspirin EC 81 MG tablet Take 81 mg by mouth daily.    . carvedilol (COREG) 25 MG tablet Take 1 tablet (25 mg total) by mouth 2 (two) times daily with a meal. 60 tablet 12  . gabapentin (NEURONTIN) 300 MG capsule Take 300 mg by mouth at bedtime.    . hydrALAZINE (APRESOLINE) 50 MG tablet Take 1 tablet (50 mg total) by mouth 3 (three) times daily. 90 tablet 0  . insulin glargine (LANTUS) 100 UNIT/ML injection Inject 30 Units at bedtime into the skin.     Marland Kitchen insulin lispro (HUMALOG) 100 UNIT/ML injection Inject into the skin 3 (three) times daily before meals.    . Lancets (FREESTYLE) lancets Use as instructed 100 each 12  . metFORMIN (GLUCOPHAGE) 500 MG tablet Take 500 mg 2 (two) times daily with a meal by mouth.    . potassium chloride SA (K-DUR,KLOR-CON) 20 MEQ tablet Take 1 tablet (20 mEq total) by mouth daily. 30 tablet 0  . sacubitril-valsartan (ENTRESTO) 97-103 MG Take 1 tablet by mouth 2 (two) times daily. 60 tablet 0  . torsemide (DEMADEX) 20 MG tablet Please take 40 mg oral every morning and 20 mg every evening. (Patient taking differently: Take 20-40 mg 2 (two) times daily by mouth. Please take 40 mg oral every morning and 20 mg every evening.) 90 tablet 0  . nitroGLYCERIN (NITROSTAT) 0.4 MG SL tablet Place 1 tablet (0.4 mg total) under the tongue every 5 (five) minutes as needed for chest pain. (Patient not taking: Reported on 05/05/2017) 30 tablet 12   No current facility-administered medications for this visit.     PHYSICAL EXAMINATION:  Vitals:   05/05/17 0917  BP: (!) 179/98  Pulse: 80  Resp: 16  Temp: 98 F (36.7 C)  SpO2: 100%    Filed Weights   05/05/17 0917  Weight:  238 lb 4.8 oz (108.1 kg)   Physical Exam Constitutional: Well-developed, well-nourished, and in no distress.   HENT:  Head: Normocephalic and atraumatic.  Mouth/Throat: No oropharyngeal exudate. Mucosa moist. Eyes: Pupils are equal, round, and reactive to light. Conjunctivae are normal. No scleral icterus.  Neck: Normal range of motion. Neck supple. No JVD present.  Cardiovascular: Normal rate, regular rhythm and normal heart sounds.  Exam reveals no gallop and no friction rub.   No murmur heard. Pulmonary/Chest: Effort normal and breath sounds normal. No respiratory distress. No wheezes.No rales.  Abdominal: Soft. Bowel sounds are normal. No distension. There is no tenderness. There is no guarding.  Musculoskeletal: No edema or tenderness.  Lymphadenopathy:    No cervical or supraclavicular  adenopathy.  Neurological: Alert and oriented to person, place, and time. No cranial nerve deficit.  Skin: Skin is warm and dry. No rash noted. No erythema. No pallor.  Psychiatric: Affect and judgment normal.     LABORATORY DATA: I have personally reviewed the data as listed:  Appointment on 05/05/2017  Component Date Value Ref Range Status  . WBC 05/05/2017 10.6* 4.0 - 10.5 K/uL Final  . RBC 05/05/2017 4.93  3.87 - 5.11 MIL/uL Final  . Hemoglobin 05/05/2017 11.3* 12.0 - 15.0 g/dL Final  . HCT 05/05/2017 37.5  36.0 - 46.0 % Final  . MCV 05/05/2017 76.1* 78.0 - 100.0 fL Final  . MCH 05/05/2017 22.9* 26.0 - 34.0 pg Final  . MCHC 05/05/2017 30.1  30.0 - 36.0 g/dL Final  . RDW 05/05/2017 14.7  11.5 - 15.5 % Final  . Platelets 05/05/2017 262  150 - 400 K/uL Final  . Neutrophils Relative % 05/05/2017 74  % Final  . Neutro Abs 05/05/2017 7.9* 1.7 - 7.7 K/uL Final  . Lymphocytes Relative 05/05/2017 20  % Final  . Lymphs Abs 05/05/2017 2.1  0.7 - 4.0 K/uL Final  . Monocytes Relative 05/05/2017 5  % Final  . Monocytes Absolute 05/05/2017 0.5  0.1 - 1.0 K/uL Final  . Eosinophils Relative 05/05/2017  1  % Final  . Eosinophils Absolute 05/05/2017 0.1  0.0 - 0.7 K/uL Final  . Basophils Relative 05/05/2017 0  % Final  . Basophils Absolute 05/05/2017 0.0  0.0 - 0.1 K/uL Final  . Sodium 05/05/2017 136  135 - 145 mmol/L Final  . Potassium 05/05/2017 3.3* 3.5 - 5.1 mmol/L Final  . Chloride 05/05/2017 93* 101 - 111 mmol/L Final  . CO2 05/05/2017 30  22 - 32 mmol/L Final  . Glucose, Bld 05/05/2017 288* 65 - 99 mg/dL Final  . BUN 05/05/2017 22* 6 - 20 mg/dL Final  . Creatinine, Ser 05/05/2017 0.96  0.44 - 1.00 mg/dL Final  . Calcium 05/05/2017 9.3  8.9 - 10.3 mg/dL Final  . Total Protein 05/05/2017 8.6* 6.5 - 8.1 g/dL Final  . Albumin 05/05/2017 3.8  3.5 - 5.0 g/dL Final  . AST 05/05/2017 20  15 - 41 U/L Final  . ALT 05/05/2017 12* 14 - 54 U/L Final  . Alkaline Phosphatase 05/05/2017 89  38 - 126 U/L Final  . Total Bilirubin 05/05/2017 0.6  0.3 - 1.2 mg/dL Final  . GFR calc non Af Amer 05/05/2017 >60  >60 mL/min Final  . GFR calc Af Amer 05/05/2017 >60  >60 mL/min Final   Comment: (NOTE) The eGFR has been calculated using the CKD EPI equation. This calculation has not been validated in all clinical situations. eGFR's persistently <60 mL/min signify possible Chronic Kidney Disease.   . Anion gap 05/05/2017 13  5 - 15 Final  . Retic Ct Pct 05/05/2017 1.4  0.4 - 3.1 % Final  . RBC. 05/05/2017 4.93  3.87 - 5.11 MIL/uL Final  . Retic Count, Absolute 05/05/2017 69.0  19.0 - 186.0 K/uL Final    RADIOGRAPHIC STUDIES: I have personally reviewed the radiological images as listed and agree with the findings in the report  Dg Knee 3 Views Right  Result Date: 05/03/2017 Clinical:  History of right knee pain. X-rays were done of the right knee, three views. There is mild to moderate degenerative joint disease of the right knee with small osteophytes of the distal femur more laterally and of the proximal tibia.  Osteophytes are present on  the patella femoral joint.  No fracture or loose body  present.  Bone quality is good. Impression:  Mild to moderate degenerative changes of the right knee, no acute findings. Electronically [place], MD 12/18/20189:13 AM    ASSESSMENT/PLAN Microcytic anemia suspect likely secondary to iron deficiency due to menstrual bleeding.  PLAN: Will perform a full anemia workup with labs stated below. Patient has never had oral iron before, depending on how her iron studies are, we may start her out on ferrous sulfate 31m PO TID with meals and see how she tolerates it. If she is intolerant to oral iron then we may give her IV iron replacement with injectafer or feraheme instead. RTC in 7 days to review labs and to discuss the next plan of care.  Orders Placed This Encounter  Procedures  . CBC with Differential    Standing Status:   Future    Number of Occurrences:   1    Standing Expiration Date:   05/05/2018  . Comprehensive metabolic panel    Standing Status:   Future    Number of Occurrences:   1    Standing Expiration Date:   05/05/2018  . Hemoglobinopathy evaluation    Standing Status:   Future    Number of Occurrences:   1    Standing Expiration Date:   05/05/2018  . Iron and TIBC    Standing Status:   Future    Number of Occurrences:   1    Standing Expiration Date:   05/05/2018  . Ferritin    Standing Status:   Future    Number of Occurrences:   1    Standing Expiration Date:   05/05/2018  . Vitamin B12    Standing Status:   Future    Number of Occurrences:   1    Standing Expiration Date:   05/05/2018  . Folate    Standing Status:   Future    Number of Occurrences:   1    Standing Expiration Date:   05/05/2018  . Reticulocytes    Standing Status:   Future    Number of Occurrences:   1    Standing Expiration Date:   05/05/2018  . Haptoglobin    Standing Status:   Future    Standing Expiration Date:   05/05/2018  . Erythropoietin    Standing Status:   Future    Standing Expiration Date:   05/05/2018  . Direct  antiglobulin test    Standing Status:   Future    Standing Expiration Date:   05/05/2018    All questions were answered. The patient knows to call the clinic with any problems, questions or concerns.  This note was electronically signed.    LTwana First MD  05/05/2017 10:41 AM

## 2017-05-06 LAB — HEMOGLOBINOPATHY EVALUATION
HGB A2 QUANT: 2 % (ref 1.8–3.2)
HGB A: 98 % (ref 96.4–98.8)
Hgb C: 0 %
Hgb F Quant: 0 % (ref 0.0–2.0)
Hgb S Quant: 0 %
Hgb Variant: 0 %

## 2017-05-12 ENCOUNTER — Encounter (HOSPITAL_COMMUNITY): Payer: Self-pay | Admitting: Adult Health

## 2017-05-12 ENCOUNTER — Encounter (HOSPITAL_BASED_OUTPATIENT_CLINIC_OR_DEPARTMENT_OTHER): Payer: Medicaid Other | Admitting: Adult Health

## 2017-05-12 ENCOUNTER — Other Ambulatory Visit: Payer: Self-pay

## 2017-05-12 VITALS — BP 163/93 | HR 78 | Temp 98.1°F | Resp 20 | Ht 66.0 in | Wt 240.0 lb

## 2017-05-12 DIAGNOSIS — D500 Iron deficiency anemia secondary to blood loss (chronic): Secondary | ICD-10-CM | POA: Diagnosis present

## 2017-05-12 DIAGNOSIS — N920 Excessive and frequent menstruation with regular cycle: Secondary | ICD-10-CM

## 2017-05-12 DIAGNOSIS — F329 Major depressive disorder, single episode, unspecified: Secondary | ICD-10-CM | POA: Diagnosis not present

## 2017-05-12 DIAGNOSIS — E876 Hypokalemia: Secondary | ICD-10-CM | POA: Diagnosis not present

## 2017-05-12 DIAGNOSIS — D509 Iron deficiency anemia, unspecified: Secondary | ICD-10-CM

## 2017-05-12 MED ORDER — POTASSIUM CHLORIDE CRYS ER 20 MEQ PO TBCR: 20.0000 meq | EXTENDED_RELEASE_TABLET | Freq: Every day | ORAL | 0 refills | Status: DC

## 2017-05-12 NOTE — Progress Notes (Signed)
[place], [place] [address]   CLINIC:  Medical Oncology/Hematology  PCP:  Rosita Fire, MD [place] [id number] [phone]   REASON FOR VISIT:  Follow-up for Microcytic anemia   CURRENT THERAPY: Initial work-up    HISTORY OF PRESENT ILLNESS:  (From Dr. Laverle Patter note on 05/05/17)  Chelsey Santiago 41 y.o. female presents today for evaluation of microcytic anemia.  Patient had CBC performed on 04/05/2017 which demonstrated WBC 8.5K, hemoglobin 9.8 g/dL, hematocrit 32%, MCV 74.8, platelet count 230k.  Her baseline hemoglobin from April 2018 was 10 g/dL.  She has a chronic microcytosis.  Patient states that she is chronically fatigued and feels like she has no energy.  She has constant ice cravings.  She denies any chest pain, shortness breath, abdominal pain, nausea, vomiting, diarrhea, focal weakness or recent infections.  She states that she used to have menorrhagia however since she had her tubal ligation in 2015, she states her periods have been lighter.  She states her normal menstrual cycle last about 4 days and her heaviest flow is during the first 2 days. She denies any other sources of bleeding including hemoptysis, hematuria, melena, hematochezia.   INTERVAL HISTORY:  Chelsey Santiago 41 y.o. female returns for routine follow-up for anemia.    Overall, she tells me she has been feeling "okay."  Appetite 50%; she has no energy. She feels tired and weak at times. "I just feel drained."  She attributes some of her fatigue to her depression. She tells me that she feels hopeless at times, but denies suicidal ideation.  "I was in a really bad spot after my last baby and it seems like the post-partum held on for a long time. So I went to mental health and they are helping me. I was in the pits of hell and I feel like I'm finally coming out of it."  She is due to see them again soon; she thinks they may start her on  anti-depressant medication at that time.    She has intermittent leg swelling; she has heart failure and sees her cardiologist regularly. She has chronic numbness/burning to her (L) foot, which is not new. She has arthralgias, which also re not a new complaint for her.   Denies any fran bleeding episodes including blood in her stools, dark/tarry stools, hematuria, nosebleeds, or gingival bleeding.  She has some ice cravings.    She had blood work done about 1 week ago; she'd like to review those results together today.      REVIEW OF SYSTEMS:  Review of Systems  Constitutional: Positive for fatigue.  HENT:  Negative.   Eyes: Negative.   Respiratory: Negative.   Cardiovascular: Positive for leg swelling.  Gastrointestinal: Negative.  Negative for blood in stool.  Endocrine: Negative.   Genitourinary: Negative.  Negative for hematuria.   Musculoskeletal: Positive for arthralgias.  Skin: Negative.  Negative for rash.  Neurological: Positive for numbness.  Hematological: Negative.   Psychiatric/Behavioral: Positive for depression.     PAST MEDICAL/SURGICAL HISTORY:  Past Medical History:  Diagnosis Date  . Anemia    H&H of 10.6/33 and 07/2008 and 11.9/35 and 09/2010  . Anxiety   . CHF (congestive heart failure) ([facility])   . Depression with anxiety   . Diabetes mellitus without complication ([facility])   . Enlarged heart   . Fasting hyperglycemia   . Hypertension    Lab: Normal BMet except glucose of 118  in 09/2010  . Hypertensive heart disease 2009   Pulmonary edema postpartum; mild to moderate mitral regurgitation when hospitalized for CHF in 2009; Echocardiogram in 12/2009-no MR and normal EF; normal CXR in 09/2010  . Migraine headache   . Miscarriage 03/19/2013  . Obesity 04/16/2009  . Osteoarthritis, knee 03/29/2011  . Preeclampsia   . Pregnant   . Pulmonary edema   . Sleep apnea   . Threatened abortion in early pregnancy 03/15/2013   Past Surgical History:  Procedure Laterality  Date  . BREAST REDUCTION SURGERY  2002  . CARDIAC CATHETERIZATION N/A 12/22/2015   Procedure: Left Heart Cath and Coronary Angiography;  Surgeon: Peter M Martinique, MD;  Location: [facility];  Service: Cardiovascular;  Laterality: N/A;  . CESAREAN SECTION N/A 04/09/2014   Procedure: CESAREAN SECTION;  Surgeon: Mora Bellman, MD;  Location: [facility];  Service: Obstetrics;  Laterality: N/A;  . CHOLECYSTECTOMY       SOCIAL HISTORY:  Social History   Socioeconomic History  . Marital status: Married    Spouse name: Not on file  . Number of children: Not on file  . Years of education: Not on file  . Highest education level: Not on file  Social Needs  . Financial resource strain: Not on file  . Food insecurity - worry: Not on file  . Food insecurity - inability: Not on file  . Transportation needs - medical: Not on file  . Transportation needs - non-medical: Not on file  Occupational History  . Occupation: unemployed    Fish farm manager: UNEMPLOYED  Tobacco Use  . Smoking status: Never Smoker  . Smokeless tobacco: Never Used  Substance and Sexual Activity  . Alcohol use: Yes    Comment: occ  . Drug use: No  . Sexual activity: Not Currently    Birth control/protection: Surgical    Comment: tubal  Other Topics Concern  . Not on file  Social History Narrative   Lives in [place]   Engaged/boyfriend (father to youngest chid)   4 children: daughter (2 as of 2013), sons (15, 44, 32 as of 2013)   Religion: christian    FAMILY HISTORY:  Family History  Problem Relation Age of Onset  . Diabetes Mother   . Heart disease Mother   . Hyperlipidemia Paternal Grandfather   . Hypertension Paternal Grandfather   . Heart disease Father   . Hypertension Father   . Heart disease Maternal Grandmother   . ADD / ADHD Son   . Hypertension Maternal Uncle   . Heart attack Brother   . Sudden death Neg Hx     CURRENT MEDICATIONS:  Outpatient Encounter Medications as of 05/12/2017  Medication Sig  Note  . aspirin EC 81 MG tablet Take 81 mg by mouth daily.   . carvedilol (COREG) 25 MG tablet Take 1 tablet (25 mg total) by mouth 2 (two) times daily with a meal.   . gabapentin (NEURONTIN) 300 MG capsule Take 600 mg by mouth at bedtime.    . hydrALAZINE (APRESOLINE) 50 MG tablet Take 1 tablet (50 mg total) by mouth 3 (three) times daily.   . insulin glargine (LANTUS) 100 UNIT/ML injection Inject 30 Units at bedtime into the skin.    Marland Kitchen insulin lispro (HUMALOG) 100 UNIT/ML injection Inject into the skin 3 (three) times daily before meals.   . Lancets (FREESTYLE) lancets Use as instructed   . metFORMIN (GLUCOPHAGE) 500 MG tablet Take 500 mg 2 (two) times daily with a meal  by mouth. 03/25/2017: Filled 11-2016  . nitroGLYCERIN (NITROSTAT) 0.4 MG SL tablet Place 1 tablet (0.4 mg total) under the tongue every 5 (five) minutes as needed for chest pain.   . potassium chloride SA (K-DUR,KLOR-CON) 20 MEQ tablet Take 1 tablet (20 mEq total) by mouth daily.   . sacubitril-valsartan (ENTRESTO) 97-103 MG Take 1 tablet by mouth 2 (two) times daily. 03/25/2017: Last filled- 11-26-16   . torsemide (DEMADEX) 20 MG tablet Please take 40 mg oral every morning and 20 mg every evening. (Patient taking differently: Take 20-40 mg 2 (two) times daily by mouth. Please take 40 mg oral every morning and 20 mg every evening.)   . [DISCONTINUED] potassium chloride SA (K-DUR,KLOR-CON) 20 MEQ tablet Take 1 tablet (20 mEq total) by mouth daily.    No facility-administered encounter medications on file as of 05/12/2017.     ALLERGIES:  Allergies  Allergen Reactions  . Diclofenac Swelling    AND POSSIBLE SYNCOPE; tolerates ibuprofen per pt  . Tramadol Nausea And Vomiting    Itching (12/21); tolerates ibuprofen per pt  . Vicodin [Hydrocodone-Acetaminophen] Itching and Nausea Only     PHYSICAL EXAM:  ECOG Performance status: 1 - Symptomatic; remains independent   Vitals:   05/12/17 1452  BP: (!) 163/93  Pulse: 78    Resp: 20  Temp: 98.1 F (36.7 C)  SpO2: 97%   Filed Weights   05/12/17 1452  Weight: 240 lb (108.9 kg)    Physical Exam  Constitutional: She is oriented to person, place, and time and well-developed, well-nourished, and in no distress.  HENT:  Head: Normocephalic.  Mouth/Throat: Oropharynx is clear and moist. No oropharyngeal exudate.  Eyes: Conjunctivae are normal. Pupils are equal, round, and reactive to light. No scleral icterus.  Neck: Normal range of motion. Neck supple.  Cardiovascular: Normal rate, regular rhythm and normal heart sounds.  Pulmonary/Chest: Effort normal and breath sounds normal. No respiratory distress.  Abdominal: Soft. Bowel sounds are normal. There is no tenderness.  Musculoskeletal: Normal range of motion. She exhibits edema (Trace ankle edema bilat).  Lymphadenopathy:    She has no cervical adenopathy.       Right: No supraclavicular adenopathy present.       Left: No supraclavicular adenopathy present.  Neurological: She is alert and oriented to person, place, and time. No cranial nerve deficit. Gait normal.  Skin: Skin is warm and dry. No rash noted.  Psychiatric: Mood, memory, affect and judgment normal.  Nursing note and vitals reviewed.    LABORATORY DATA:  I have reviewed the labs as listed.  CBC    Component Value Date/Time   WBC 10.6 (H) 05/05/2017 0924   RBC 4.93 05/05/2017 0924   RBC 4.93 05/05/2017 0924   HGB 11.3 (L) 05/05/2017 0924   HCT 37.5 05/05/2017 0924   PLT 262 05/05/2017 0924   MCV 76.1 (L) 05/05/2017 0924   MCH 22.9 (L) 05/05/2017 0924   MCHC 30.1 05/05/2017 0924   RDW 14.7 05/05/2017 0924   LYMPHSABS 2.1 05/05/2017 0924   MONOABS 0.5 05/05/2017 0924   EOSABS 0.1 05/05/2017 0924   BASOSABS 0.0 05/05/2017 0924   CMP Latest Ref Rng & Units 05/05/2017 03/26/2017 03/25/2017  Glucose 65 - 99 mg/dL 288(H) 245(H) 220(H)  BUN 6 - 20 mg/dL 22(H) 13 12  Creatinine 0.44 - 1.00 mg/dL 0.96 0.87 0.83  Sodium 135 - 145 mmol/L  136 136 134(L)  Potassium 3.5 - 5.1 mmol/L 3.3(L) 3.4(L) 3.3(L)  Chloride 101 -  111 mmol/L 93(L) 99(L) 98(L)  CO2 22 - 32 mmol/L 30 27 26   Calcium 8.9 - 10.3 mg/dL 9.3 8.3(L) 8.9  Total Protein 6.5 - 8.1 g/dL 8.6(H) - -  Total Bilirubin 0.3 - 1.2 mg/dL 0.6 - -  Alkaline Phos 38 - 126 U/L 89 - -  AST 15 - 41 U/L 20 - -  ALT 14 - 54 U/L 12(L) - -   Results for ALPA, SALVO (MRN [id number])   Ref. Range 05/05/2017 09:25  Iron Latest Ref Range: 28 - 170 ug/dL 78  UIBC Latest Units: ug/dL 369  TIBC Latest Ref Range: 250 - 450 ug/dL 447  Saturation Ratios Latest Ref Range: 10.4 - 31.8 % 17  Ferritin Latest Ref Range: 11 - 307 ng/mL 19  Folate Latest Ref Range: >5.9 ng/mL 12.6  Vitamin B12 Latest Ref Range: 180 - 914 pg/mL 536  Results for TIMIA, CASSELMAN (MRN [id number])   Ref. Range 05/05/2017 09:24  Hgb A Latest Ref Range: 96.4 - 98.8 % 98.0  Hgb A2 Quant Latest Ref Range: 1.8 - 3.2 % 2.0  Hgb F Quant Latest Ref Range: 0.0 - 2.0 % 0.0  Hgb S Quant Latest Ref Range: 0.0 % 0.0  HGB C Latest Ref Range: 0.0 % 0.0  HGB VARIANT Latest Ref Range: 0.0 % 0.0  Please Note: Unknown Comment      PENDING LABS:    DIAGNOSTIC IMAGING:    PATHOLOGY:     ASSESSMENT & PLAN:   Microcytic anemia:  -Most recent blood work reviewed with patient in detail today. Ferritin 19, indicative of iron deficiency.  Hgb mildly low at 11.3 g/dL. Mildly elevated WBCs at 10.6 and may be reactive; no recent fevers or infection.  Vitamin B12 and folate levels are normal.  -Will make arrangements for 1 dose of IV Feraheme. Goal ferritin >100.  She has never had IV iron before. We discussed the purpose and possible side effects of iron infusions, including hypersensitivity/anaphylactic reactions.     Depression:  -Commended her for reaching out for help with psychiatry. Provided emotional support through active listening, validation of her concerns, and expressive supportive counseling.   -Encouraged  her to follow-up with behavioral health as recommended; she plans to do so.   Hypokalemia:  -Generally managed by her PCP. However, she is out of her medication and I have e-scribed 1 month's supply to her pharmacy with no refills. Recommended she follow-up with her PCP for additional refills as needed.     Dispo:  -1 dose IV Feraheme when able. Supportive therapy treatment plan built accordingly.  -Return to cancer center in 2 months for follow-up with labs.    All questions were answered to patient's stated satisfaction. Encouraged patient to call with any new concerns or questions before her next visit to the cancer center and we can certain see her sooner, if needed.      Orders placed this encounter:  Orders Placed This Encounter  Procedures  . CBC with Differential/Platelet  . Comprehensive metabolic panel  . Ferritin  . Iron and TIBC      Mike Craze, NP [place] [phone]

## 2017-05-12 NOTE — Patient Instructions (Signed)
[place] at [facility] Discharge Instructions  RECOMMENDATIONS MADE BY THE CONSULTANT AND ANY TEST RESULTS WILL BE SENT TO YOUR REFERRING PHYSICIAN.  You were seen today by Mike Craze NP. Feraheme to be scheduled. Return in 2 months for labs and follow up.   Thank you for choosing [place] at [facility] to provide your oncology and hematology care.  To afford each patient quality time with our provider, please arrive at least 15 minutes before your scheduled appointment time.    If you have a lab appointment with the [facility] please come in thru the  Main Entrance and check in at the main information desk  You need to re-schedule your appointment should you arrive 10 or more minutes late.  We strive to give you quality time with our providers, and arriving late affects you and other patients whose appointments are after yours.  Also, if you no show three or more times for appointments you may be dismissed from the clinic at the providers discretion.     Again, thank you for choosing [facility].  Our hope is that these requests will decrease the amount of time that you wait before being seen by our physicians.       _____________________________________________________________  Should you have questions after your visit to [facility], please contact our office at [phone] between the hours of 8:30 a.m. and 4:30 p.m.  Voicemails left after 4:30 p.m. will not be returned until the following business day.  For prescription refill requests, have your pharmacy contact our office.       Resources For Cancer Patients and their Caregivers ? American Cancer Society: Can assist with transportation, wigs, general needs, runs Look Good Feel Better.        7432568794 ? Cancer Care: Provides financial assistance, online support groups, medication/co-pay assistance.  1-800-813-HOPE 743-235-1024) ? [facility] Assists [place] cancer patients and their families through emotional , educational and financial support.  639-730-1691 ? [place] DSS Where to apply for food stamps, Medicaid and utility assistance. 814 213 9869 ? [employer]: Transportation to medical appointments. 907-701-5471 ? Social Security Administration: May apply for disability if have a Stage IV cancer. 640-799-8358 619 280 0118 ? [employer], Disability and Transit Services: Assists with nutrition, care and transit needs. [facility] Support Programs: @10RELATIVEDAYS @ > Cancer Support Group  2nd Tuesday of the month 1pm-2pm, Journey Room  > Creative Journey  3rd Tuesday of the month 1130am-1pm, Journey Room  > Look Good Feel Better  1st Wednesday of the month 10am-12 noon, Journey Room (Call [facility] to register [phone])

## 2017-05-16 ENCOUNTER — Encounter (HOSPITAL_COMMUNITY): Payer: Self-pay

## 2017-05-16 ENCOUNTER — Encounter (HOSPITAL_BASED_OUTPATIENT_CLINIC_OR_DEPARTMENT_OTHER): Payer: Medicaid Other

## 2017-05-16 ENCOUNTER — Encounter (HOSPITAL_COMMUNITY): Payer: Self-pay | Admitting: Adult Health

## 2017-05-16 VITALS — BP 162/91 | HR 84 | Temp 97.4°F | Resp 18

## 2017-05-16 DIAGNOSIS — D509 Iron deficiency anemia, unspecified: Secondary | ICD-10-CM | POA: Insufficient documentation

## 2017-05-16 MED ORDER — SODIUM CHLORIDE 0.9% FLUSH
3.0000 mL | Freq: Once | INTRAVENOUS | Status: AC | PRN
  Administered 2017-05-16: 3 mL via INTRAVENOUS

## 2017-05-16 MED ORDER — FERUMOXYTOL INJECTION 510 MG/17 ML
510.0000 mg | Freq: Once | INTRAVENOUS | Status: AC
  Administered 2017-05-16: 510 mg via INTRAVENOUS
  Filled 2017-05-16: qty 17

## 2017-05-16 MED ORDER — SODIUM CHLORIDE 0.9 % IV SOLN
Freq: Once | INTRAVENOUS | Status: AC
  Administered 2017-05-16: 12:00:00 via INTRAVENOUS

## 2017-05-16 NOTE — Progress Notes (Signed)
[name].tolerated Feraheme infusion well without complaints or incident. VSS upon discharge. Pt discharged self ambulatory in satisfactory condition

## 2017-05-16 NOTE — Patient Instructions (Signed)
[facility] at [facility] Discharge Instructions  RECOMMENDATIONS MADE BY THE CONSULTANT AND ANY TEST RESULTS WILL BE SENT TO YOUR REFERRING PHYSICIAN.  Received Feraheme infusion today.Follow-up as scheduled. Call clinic for any questions or concerns  Thank you for choosing [facility] at [facility] to provide your oncology and hematology care.  To afford each patient quality time with our provider, please arrive at least 15 minutes before your scheduled appointment time.    If you have a lab appointment with the Cancer Center please come in thru the  Main Entrance and check in at the main information desk  You need to re-schedule your appointment should you arrive 10 or more minutes late.  We strive to give you quality time with our providers, and arriving late affects you and other patients whose appointments are after yours.  Also, if you no show three or more times for appointments you may be dismissed from the clinic at the providers discretion.     Again, thank you for choosing  Cancer Center.  Our hope is that these requests will decrease the amount of time that you wait before being seen by our physicians.       _____________________________________________________________  Should you have questions after your visit to  Cancer Center, please contact our office at [phone] between the hours of 8:30 a.m. and 4:30 p.m.  Voicemails left after 4:30 p.m. will not be returned until the following business day.  For prescription refill requests, have your pharmacy contact our office.       Resources For Cancer Patients and their Caregivers ? American Cancer Society: Can assist with transportation, wigs, general needs, runs Look Good Feel Better.        1-888-227-6333 ? Cancer Care: Provides financial assistance, online support groups, medication/co-pay assistance.  1-800-813-HOPE (4673) ? [facility] Assists [place] cancer patients and their families through emotional , educational and financial support.  336-427-4357 ? [place] DSS Where to apply for food stamps, Medicaid and utility assistance. 336-342-1394 ? RCATS: Transportation to medical appointments. 336-347-2287 ? Social Security Administration: May apply for disability if have a Stage IV cancer. 336-342-7796 1-800-772-1213 ? [place] [facility], Disability and Transit Services: Assists with nutrition, care and transit needs. 336-349-2343  Cancer Center Support Programs: @10RELATIVEDAYS@ > Cancer Support Group  2nd Tuesday of the month 1pm-2pm, Journey Room  > Creative Journey  3rd Tuesday of the month 1130am-1pm, Journey Room  > Look Good Feel Better  1st Wednesday of the month 10am-12 noon, Journey Room (Call American Cancer [employer] to register 1-800-395-5775)   

## 2017-05-24 ENCOUNTER — Inpatient Hospital Stay (HOSPITAL_COMMUNITY): Payer: Medicaid Other | Attending: Adult Health

## 2017-05-24 ENCOUNTER — Encounter: Payer: Self-pay | Admitting: Cardiology

## 2017-05-24 ENCOUNTER — Ambulatory Visit (INDEPENDENT_AMBULATORY_CARE_PROVIDER_SITE_OTHER): Payer: Medicaid Other | Admitting: Cardiology

## 2017-05-24 ENCOUNTER — Other Ambulatory Visit: Payer: Self-pay

## 2017-05-24 ENCOUNTER — Encounter (HOSPITAL_COMMUNITY): Payer: Self-pay

## 2017-05-24 VITALS — BP 160/96 | HR 80 | Temp 97.6°F | Resp 20

## 2017-05-24 VITALS — BP 160/118 | HR 80 | Ht 66.0 in | Wt 239.0 lb

## 2017-05-24 DIAGNOSIS — D509 Iron deficiency anemia, unspecified: Secondary | ICD-10-CM | POA: Insufficient documentation

## 2017-05-24 DIAGNOSIS — I10 Essential (primary) hypertension: Secondary | ICD-10-CM | POA: Diagnosis not present

## 2017-05-24 DIAGNOSIS — Z79899 Other long term (current) drug therapy: Secondary | ICD-10-CM

## 2017-05-24 DIAGNOSIS — I5022 Chronic systolic (congestive) heart failure: Secondary | ICD-10-CM | POA: Diagnosis not present

## 2017-05-24 DIAGNOSIS — G4730 Sleep apnea, unspecified: Secondary | ICD-10-CM | POA: Diagnosis not present

## 2017-05-24 MED ORDER — SODIUM CHLORIDE 0.9 % IV SOLN
510.0000 mg | Freq: Once | INTRAVENOUS | Status: AC
  Administered 2017-05-24: 510 mg via INTRAVENOUS
  Filled 2017-05-24: qty 17

## 2017-05-24 MED ORDER — SPIRONOLACTONE 25 MG PO TABS: 25.0000 mg | ORAL_TABLET | Freq: Every day | ORAL | 3 refills | Status: DC

## 2017-05-24 MED ORDER — SODIUM CHLORIDE 0.9% FLUSH: 3.0000 mL | Freq: Once | INTRAVENOUS | Status: DC | PRN

## 2017-05-24 MED ORDER — SODIUM CHLORIDE 0.9 % IV SOLN
Freq: Once | INTRAVENOUS | Status: AC
  Administered 2017-05-24: 14:00:00 via INTRAVENOUS

## 2017-05-24 NOTE — Progress Notes (Signed)
Clinical Summary Chelsey Santiago is a 42 y.o.female seen today for follow up of the following medical problems.  1. HTN - history of poor medication compliance, though she reports recent compliance.    2. Chronic systolic heart failure - 12/1273 echo: LVEF 40-45%, grade II diastolic dysfunction - 05/6999 cath with normal coronaries   - seen at [facility] ER for SOB and HTN. Given IV NG and IV lasix, discharged from ER - since then still with some SOB. Recent edema. Recently doubled fluid pills with improvement. Takes 40mg  in AM and 20mg  in PM, last few days taking 40mg  bid x 3 days. with some improvement.  - compliant with coreg,  - headaches on long acting nitrate and stopped taking. She is unsure what happened with her aldactone.    3. Dizziness - mainly occurs with activity. - on Saturday while shopping. Was going to sit down, seat broke and fell to ground. No other episodes.    4. OSA - mixed compliance with CPAP - has not seen Dr Luan Pulling since our last visit  Past Medical History:  Diagnosis Date  . Anemia    H&H of 10.6/33 and 07/2008 and 11.9/35 and 09/2010  . Anxiety   . CHF (congestive heart failure) ([place])   . Depression with anxiety   . Diabetes mellitus without complication ([place])   . Enlarged heart   . Fasting hyperglycemia   . Hypertension    Lab: Normal BMet except glucose of 118 in 09/2010  . Hypertensive heart disease 2009   Pulmonary edema postpartum; mild to moderate mitral regurgitation when hospitalized for CHF in 2009; Echocardiogram in 12/2009-no MR and normal EF; normal CXR in 09/2010  . Migraine headache   . Miscarriage 03/19/2013  . Obesity 04/16/2009  . Osteoarthritis, knee 03/29/2011  . Preeclampsia   . Pregnant   . Pulmonary edema   . Sleep apnea   . Threatened abortion in early pregnancy 03/15/2013     Allergies  Allergen Reactions  . Diclofenac Swelling    AND POSSIBLE SYNCOPE; tolerates ibuprofen per pt  . Tramadol Nausea And  Vomiting    Itching (12/21); tolerates ibuprofen per pt  . Vicodin [Hydrocodone-Acetaminophen] Itching and Nausea Only     Current Outpatient Medications  Medication Sig Dispense Refill  . aspirin EC 81 MG tablet Take 81 mg by mouth daily.    . carvedilol (COREG) 25 MG tablet Take 1 tablet (25 mg total) by mouth 2 (two) times daily with a meal. 60 tablet 12  . gabapentin (NEURONTIN) 300 MG capsule Take 600 mg by mouth at bedtime.     . hydrALAZINE (APRESOLINE) 50 MG tablet Take 1 tablet (50 mg total) by mouth 3 (three) times daily. 90 tablet 0  . insulin glargine (LANTUS) 100 UNIT/ML injection Inject 30 Units at bedtime into the skin.     Marland Kitchen insulin lispro (HUMALOG) 100 UNIT/ML injection Inject into the skin 3 (three) times daily before meals.    . Lancets (FREESTYLE) lancets Use as instructed 100 each 12  . metFORMIN (GLUCOPHAGE) 500 MG tablet Take 500 mg 2 (two) times daily with a meal by mouth.    . nitroGLYCERIN (NITROSTAT) 0.4 MG SL tablet Place 1 tablet (0.4 mg total) under the tongue every 5 (five) minutes as needed for chest pain. 30 tablet 12  . potassium chloride SA (K-DUR,KLOR-CON) 20 MEQ tablet Take 1 tablet (20 mEq total) by mouth daily. 30 tablet 0  . sacubitril-valsartan (ENTRESTO) 97-103 MG Take  1 tablet by mouth 2 (two) times daily. 60 tablet 0  . torsemide (DEMADEX) 20 MG tablet Please take 40 mg oral every morning and 20 mg every evening. (Patient taking differently: Take 20-40 mg 2 (two) times daily by mouth. Please take 40 mg oral every morning and 20 mg every evening.) 90 tablet 0   No current facility-administered medications for this visit.      Past Surgical History:  Procedure Laterality Date  . BREAST REDUCTION SURGERY  2002  . CARDIAC CATHETERIZATION N/A 12/22/2015   Procedure: Left Heart Cath and Coronary Angiography;  Surgeon: Peter M Martinique, MD;  Location: [place] [facility];  Service: Cardiovascular;  Laterality: N/A;  . CESAREAN SECTION N/A 04/09/2014    Procedure: CESAREAN SECTION;  Surgeon: Mora Bellman, MD;  Location: [facility];  Service: Obstetrics;  Laterality: N/A;  . CHOLECYSTECTOMY       Allergies  Allergen Reactions  . Diclofenac Swelling    AND POSSIBLE SYNCOPE; tolerates ibuprofen per pt  . Tramadol Nausea And Vomiting    Itching (12/21); tolerates ibuprofen per pt  . Vicodin [Hydrocodone-Acetaminophen] Itching and Nausea Only      Family History  Problem Relation Age of Onset  . Diabetes Mother   . Heart disease Mother   . Hyperlipidemia Paternal Grandfather   . Hypertension Paternal Grandfather   . Heart disease Father   . Hypertension Father   . Heart disease Maternal Grandmother   . ADD / ADHD Son   . Hypertension Maternal Uncle   . Heart attack Brother   . Sudden death Neg Hx      Social History Chelsey Santiago reports that  has never smoked. she has never used smokeless tobacco. Chelsey Santiago reports that she drinks alcohol.   Review of Systems CONSTITUTIONAL: No weight loss, fever, chills, weakness or fatigue.  HEENT: Eyes: No visual loss, blurred vision, double vision or yellow sclerae.No hearing loss, sneezing, congestion, runny nose or sore throat.  SKIN: No rash or itching.  CARDIOVASCULAR: per hpi RESPIRATORY: No shortness of breath, cough or sputum.  GASTROINTESTINAL: No anorexia, nausea, vomiting or diarrhea. No abdominal pain or blood.  GENITOURINARY: No burning on urination, no polyuria NEUROLOGICAL: No headache, dizziness, syncope, paralysis, ataxia, numbness or tingling in the extremities. No change in bowel or bladder control.  MUSCULOSKELETAL: No muscle, back pain, joint pain or stiffness.  LYMPHATICS: No enlarged nodes. No history of splenectomy.  PSYCHIATRIC: No history of depression or anxiety.  ENDOCRINOLOGIC: No reports of sweating, cold or heat intolerance. No polyuria or polydipsia.  Marland Kitchen   Physical Examination Vitals:   05/24/17 1011  BP: (!) 160/118  Pulse: 80  SpO2: 98%    Vitals:   05/24/17 1011  Weight: 239 lb (108.4 kg)  Height: 5\' 6"  (1.676 m)    Gen: resting comfortably, no acute distress HEENT: no scleral icterus, pupils equal round and reactive, no palptable cervical adenopathy,  CV: RRR, no m/r/g, no jvd Resp: Clear to auscultation bilaterally GI: abdomen is soft, non-tender, non-distended, normal bowel sounds, no hepatosplenomegaly MSK: extremities are warm, no edema.  Skin: warm, no rash Neuro:  no focal deficits Psych: appropriate affect   Diagnostic Studies 10/2014 echo Study Conclusions  - Left ventricle: The cavity size was normal. There was moderate concentric hypertrophy. Systolic function was moderately reduced. The estimated ejection fraction was in the range of 35% to 40%. Images were suboptimal for LV wall motion assessment. There appeared to be global hypokinesis. Doppler parameters are consistent  with abnormal left ventricular relaxation (grade 1 diastolic dysfunction). - Mitral valve: There was trivial regurgitation. - Left atrium: The atrium was mildly dilated.   12/2015 cath  The left ventricular systolic function is normal.  LV end diastolic pressure is mildly elevated.  The left ventricular ejection fraction is 50-55% by visual estimate.   1. No significant CAD 2. Good LV function 3. Mildly elevated LV EDP.   03/2016 cardiac MRI IMPRESSION: 1.  Technically difficult study.  2.  Normal LV size with moderate LV hypertrophy, EF 35%.  3.  Mildly dilated RV with mildly decreased RV systolic function.  4. No myocardial LGE, so no definitive evidence for prior myocardial infarction, infiltrative disease, or myocarditis.  Assessment and Plan  1.HTN - elevated in clinic. We will restart her aldactone, check BMET in 2 weeks.  - submit bp log in 1 week  2. Chronic systolic heart failure - cough potentially related to lisinopril. Now on entresto - restart aldactone, check labs 2 weeks -  no current symptoms, continue curren tmeds  3. OSA - poor compliance, she has not had her CPAP evaluated in some time - we will refer to Dr Luan Pulling for evaluation.    F/u 6 weeks      Arnoldo Lenis, M.D.

## 2017-05-24 NOTE — Patient Instructions (Addendum)
Medication Instructions:  START ALDACTONE 25 MG DAILY  PLEASE KEEP A BP LOG FOR 1 WEEK AND CALL us WITH READINGS.   Labwork: 2 WEEKS BMET  Testing/Procedures: NONE  Follow-Up: Your physician recommends that you schedule a follow-up appointment in: 6 WEEKS    Any Other Special Instructions Will Be Listed Below (If Applicable).  You have been referred to DR. HAWKINS     If you need a refill on your cardiac medications before your next appointment, please call your pharmacy.

## 2017-05-24 NOTE — Progress Notes (Signed)
Tolerated infusion w/o adverse reaction.  Alert, in no distress.  VSS.  Discharged ambulatory.  

## 2017-05-27 ENCOUNTER — Encounter: Payer: Self-pay | Admitting: Cardiology

## 2017-05-30 ENCOUNTER — Other Ambulatory Visit: Payer: Self-pay | Admitting: Cardiology

## 2017-05-30 MED ORDER — SPIRONOLACTONE 25 MG PO TABS: 25.0000 mg | ORAL_TABLET | Freq: Every day | ORAL | 3 refills | Status: DC

## 2017-05-30 NOTE — Telephone Encounter (Signed)
Pt went to [facility] for CP. Took one NTG and then went to hospital.She said 4 hrs later, they told her she was ok to go home.She is going to watch for further sx's and call back

## 2017-05-30 NOTE — Telephone Encounter (Signed)
Please resend RX for spironolactone to [facility]. Patient also had episode of chest pain on Saturday where she had to take NTG. Please call patient back.  tg

## 2017-05-31 ENCOUNTER — Encounter: Payer: Self-pay | Admitting: Orthopaedic Surgery

## 2017-05-31 ENCOUNTER — Ambulatory Visit: Payer: Medicaid Other | Admitting: Orthopaedic Surgery

## 2017-05-31 ENCOUNTER — Telehealth: Payer: Self-pay | Admitting: Radiology

## 2017-05-31 VITALS — BP 139/89 | HR 77 | Ht 66.0 in | Wt 244.0 lb

## 2017-05-31 DIAGNOSIS — G8929 Other chronic pain: Secondary | ICD-10-CM | POA: Diagnosis not present

## 2017-05-31 DIAGNOSIS — M25561 Pain in right knee: Secondary | ICD-10-CM | POA: Diagnosis not present

## 2017-05-31 MED ORDER — NAPROXEN 500 MG PO TABS: 500.0000 mg | ORAL_TABLET | Freq: Two times a day (BID) | ORAL | 5 refills | Status: DC

## 2017-05-31 NOTE — Progress Notes (Signed)
Patient [name], female DOB:1975/05/28, 42 y.o. LZJ:[id number]  Chief Complaint  Patient presents with  . Knee Pain    right     HPI  Chelsey Santiago is a 42 y.o. female who has continued pain of the right knee.  She has swelling, popping and giving way.  The injection last time did not help.    I will get MRI of the right knee.  She is complaining of pain in the right hip and left knee as well. I will begin Naprosyn.  She has allergy to diclofenac but can take Advil as well as Aleve.  HPI  Body mass index is 39.38 kg/m.  ROS  Review of Systems  HENT: Negative for congestion.   Respiratory: Negative for cough and shortness of breath.   Cardiovascular: Negative for chest pain and leg swelling.  Endocrine: Positive for cold intolerance.  Musculoskeletal: Positive for arthralgias, gait problem and joint swelling.  Allergic/Immunologic: Positive for environmental allergies.  Neurological: Positive for headaches.  Psychiatric/Behavioral: The patient is nervous/anxious.   All other systems reviewed and are negative.   Past Medical History:  Diagnosis Date  . Anemia    H&H of 10.6/33 and 07/2008 and 11.9/35 and 09/2010  . Anxiety   . CHF (congestive heart failure) ([place])   . Depression with anxiety   . Diabetes mellitus without complication ([place])   . Enlarged heart   . Fasting hyperglycemia   . Hypertension    Lab: Normal BMet except glucose of 118 in 09/2010  . Hypertensive heart disease 2009   Pulmonary edema postpartum; mild to moderate mitral regurgitation when hospitalized for CHF in 2009; Echocardiogram in 12/2009-no MR and normal EF; normal CXR in 09/2010  . Migraine headache   . Miscarriage 03/19/2013  . Obesity 04/16/2009  . Osteoarthritis, knee 03/29/2011  . Preeclampsia   . Pregnant   . Pulmonary edema   . Sleep apnea   . Threatened abortion in early pregnancy 03/15/2013    Past Surgical History:  Procedure Laterality Date  . BREAST REDUCTION  SURGERY  2002  . CARDIAC CATHETERIZATION N/A 12/22/2015   Procedure: Left Heart Cath and Coronary Angiography;  Surgeon: Peter M Martinique, MD;  Location: [place] [facility];  Service: Cardiovascular;  Laterality: N/A;  . CESAREAN SECTION N/A 04/09/2014   Procedure: CESAREAN SECTION;  Surgeon: Mora Bellman, MD;  Location: [facility];  Service: Obstetrics;  Laterality: N/A;  . CHOLECYSTECTOMY      Family History  Problem Relation Age of Onset  . Diabetes Mother   . Heart disease Mother   . Hyperlipidemia Paternal Grandfather   . Hypertension Paternal Grandfather   . Heart disease Father   . Hypertension Father   . Heart disease Maternal Grandmother   . ADD / ADHD Son   . Hypertension Maternal Uncle   . Heart attack Brother   . Sudden death Neg Hx     Social History Social History   Tobacco Use  . Smoking status: Never Smoker  . Smokeless tobacco: Never Used  Substance Use Topics  . Alcohol use: Yes    Comment: occ  . Drug use: No    Allergies  Allergen Reactions  . Diclofenac Swelling    AND POSSIBLE SYNCOPE; tolerates ibuprofen per pt  . Tramadol Nausea And Vomiting    Itching (12/21); tolerates ibuprofen per pt  . Vicodin [Hydrocodone-Acetaminophen] Itching and Nausea Only    Current Outpatient Medications  Medication Sig Dispense Refill  . aspirin EC  81 MG tablet Take 81 mg by mouth daily.    . carvedilol (COREG) 25 MG tablet Take 1 tablet (25 mg total) by mouth 2 (two) times daily with a meal. 60 tablet 12  . gabapentin (NEURONTIN) 300 MG capsule Take 600 mg by mouth at bedtime.     . hydrALAZINE (APRESOLINE) 50 MG tablet Take 1 tablet (50 mg total) by mouth 3 (three) times daily. 90 tablet 0  . insulin glargine (LANTUS) 100 UNIT/ML injection Inject 30 Units at bedtime into the skin.     Marland Kitchen insulin lispro (HUMALOG) 100 UNIT/ML injection Inject into the skin 3 (three) times daily before meals.    . Lancets (FREESTYLE) lancets Use as instructed 100 each 12  . metFORMIN  (GLUCOPHAGE) 500 MG tablet Take 500 mg 2 (two) times daily with a meal by mouth.    . nitroGLYCERIN (NITROSTAT) 0.4 MG SL tablet Place 1 tablet (0.4 mg total) under the tongue every 5 (five) minutes as needed for chest pain. 30 tablet 12  . potassium chloride SA (K-DUR,KLOR-CON) 20 MEQ tablet Take 1 tablet (20 mEq total) by mouth daily. 30 tablet 0  . sacubitril-valsartan (ENTRESTO) 97-103 MG Take 1 tablet by mouth 2 (two) times daily. 60 tablet 0  . spironolactone (ALDACTONE) 25 MG tablet Take 1 tablet (25 mg total) by mouth daily. 90 tablet 3  . torsemide (DEMADEX) 20 MG tablet Please take 40 mg oral every morning and 20 mg every evening. (Patient taking differently: Take 20-40 mg 2 (two) times daily by mouth. Please take 40 mg oral every morning and 20 mg every evening.) 90 tablet 0  . naproxen (NAPROSYN) 500 MG tablet Take 1 tablet (500 mg total) by mouth 2 (two) times daily with a meal. 60 tablet 5   No current facility-administered medications for this visit.      Physical Exam  Blood pressure 139/89, pulse 77, height 5\' 6"  (1.676 m), weight 244 lb (110.7 kg), last menstrual period 05/03/2017.  Constitutional: overall normal hygiene, normal nutrition, well developed, normal grooming, normal body habitus. Assistive device:none  Musculoskeletal: gait and station Limp right, muscle tone and strength are normal, no tremors or atrophy is present.  .  Neurological: coordination overall normal.  Deep tendon reflex/nerve stretch intact.  Sensation normal.  Cranial nerves II-XII intact.   Skin:   Normal overall no scars, lesions, ulcers or rashes. No psoriasis.  Psychiatric: Alert and oriented x 3.  Recent memory intact, remote memory unclear.  Normal mood and affect. Well groomed.  Good eye contact.  Cardiovascular: overall no swelling, no varicosities, no edema bilaterally, normal temperatures of the legs and arms, no clubbing, cyanosis and good capillary refill.  Lymphatic: palpation is  normal.  The right lower extremity is examined:  Inspection:  Thigh:  Non-tender and no defects  Knee has swelling 1+ effusion.                        Joint tenderness is present                        Patient is tender over the medial joint line  Lower Leg:  Has normal appearance and no tenderness or defects  Ankle:  Non-tender and no defects  Foot:  Non-tender and no defects Range of Motion:  Knee:  Range of motion is: 0-105  Crepitus is  present  Ankle:  Range of motion is normal. Strength and Tone:  The right lower extremity has normal strength and tone. Stability:  Knee:  The knee has positive medial McMurray  Ankle:  The ankle is stable.   All other systems reviewed and are negative   The patient has been educated about the nature of the problem(s) and counseled on treatment options.  The patient appeared to understand what I have discussed and is in agreement with it.  Encounter Diagnosis  Name Primary?  . Chronic pain of right knee Yes    PLAN Call if any problems.  Precautions discussed.   Return to clinic after MRI of the right knee.   Electronically Signed Sanjuana Kava, MD 1/15/20198:57 AM

## 2017-05-31 NOTE — Telephone Encounter (Signed)
I called and spoke with the receptionist at Dr. Josephine Cables office regarding the medical records of the patient's first visit there for her right knee.  She states the patient was seen on 04/12/17 and talked about both knees.

## 2017-06-03 ENCOUNTER — Ambulatory Visit (HOSPITAL_COMMUNITY)
Admission: RE | Admit: 2017-06-03 | Discharge: 2017-06-03 | Disposition: A | Discharge status: Home / Self Care | Payer: Medicaid Other | Source: Ambulatory Visit | Attending: Orthopaedic Surgery | Admitting: Orthopaedic Surgery

## 2017-06-03 DIAGNOSIS — M2341 Loose body in knee, right knee: Secondary | ICD-10-CM | POA: Insufficient documentation

## 2017-06-03 DIAGNOSIS — G8929 Other chronic pain: Secondary | ICD-10-CM

## 2017-06-03 DIAGNOSIS — M25561 Pain in right knee: Secondary | ICD-10-CM | POA: Diagnosis present

## 2017-06-03 DIAGNOSIS — M1711 Unilateral primary osteoarthritis, right knee: Secondary | ICD-10-CM | POA: Diagnosis not present

## 2017-06-08 ENCOUNTER — Ambulatory Visit: Payer: Medicaid Other | Admitting: Orthopaedic Surgery

## 2017-06-08 ENCOUNTER — Encounter: Payer: Self-pay | Admitting: Orthopaedic Surgery

## 2017-06-08 ENCOUNTER — Telehealth: Payer: Self-pay | Admitting: Orthopaedic Surgery

## 2017-06-08 VITALS — BP 144/92 | HR 92 | Ht 66.0 in | Wt 240.0 lb

## 2017-06-08 DIAGNOSIS — G8929 Other chronic pain: Secondary | ICD-10-CM

## 2017-06-08 DIAGNOSIS — M25561 Pain in right knee: Secondary | ICD-10-CM | POA: Diagnosis not present

## 2017-06-08 MED ORDER — NABUMETONE 750 MG PO TABS: 750.0000 mg | ORAL_TABLET | Freq: Two times a day (BID) | ORAL | 5 refills | Status: DC

## 2017-06-08 MED ORDER — MELOXICAM 15 MG PO TABS: 15.0000 mg | ORAL_TABLET | Freq: Every day | ORAL | 54 refills | Status: DC

## 2017-06-08 NOTE — Progress Notes (Signed)
Patient Chelsey Santiago, female DOB:Oct 14, 1975, 42 y.o. GYI:[id number]  Chief Complaint  Patient presents with  . Results    MRI Right knee    HPI  Chelsey Santiago is a 42 y.o. female who has right knee pain.  She had MRI done and it showed: IMPRESSION: 1. Mild progression of age advanced tricompartmental osteoarthritis for age compared with previous MRI. Given patellofemoral predominant joint space narrowing and chondrocalcinosis on radiographs, findings suggest CPPD arthropathy. 2. Intra-articular loose bodies. 3. Intact menisci and ligaments.  The loose bodies are in suprapatellar bursa area and posterolaterally.  I would not recommend surgery at this point.   I have encouraged her to lose weight.  She is thinking about bariatric surgery.  I will change to Relafen 750 po bid pc.  Stop the Naprosyn.  She is too young at this point for a total knee. HPI  Body mass index is 38.74 kg/m.  ROS  Review of Systems  Past Medical History:  Diagnosis Date  . Anemia    H&H of 10.6/33 and 07/2008 and 11.9/35 and 09/2010  . Anxiety   . CHF (congestive heart failure) ([place])   . Depression with anxiety   . Diabetes mellitus without complication ([place])   . Enlarged heart   . Fasting hyperglycemia   . Hypertension    Lab: Normal BMet except glucose of 118 in 09/2010  . Hypertensive heart disease 2009   Pulmonary edema postpartum; mild to moderate mitral regurgitation when hospitalized for CHF in 2009; Echocardiogram in 12/2009-no MR and normal EF; normal CXR in 09/2010  . Migraine headache   . Miscarriage 03/19/2013  . Obesity 04/16/2009  . Osteoarthritis, knee 03/29/2011  . Preeclampsia   . Pregnant   . Pulmonary edema   . Sleep apnea   . Threatened abortion in early pregnancy 03/15/2013    Past Surgical History:  Procedure Laterality Date  . BREAST REDUCTION SURGERY  2002  . CARDIAC CATHETERIZATION N/A 12/22/2015   Procedure: Left Heart Cath and Coronary Angiography;   Surgeon: Peter M Martinique, MD;  Location: [place] [facility];  Service: Cardiovascular;  Laterality: N/A;  . CESAREAN SECTION N/A 04/09/2014   Procedure: CESAREAN SECTION;  Surgeon: Mora Bellman, MD;  Location: [facility];  Service: Obstetrics;  Laterality: N/A;  . CHOLECYSTECTOMY      Family History  Problem Relation Age of Onset  . Diabetes Mother   . Heart disease Mother   . Hyperlipidemia Paternal Grandfather   . Hypertension Paternal Grandfather   . Heart disease Father   . Hypertension Father   . Heart disease Maternal Grandmother   . ADD / ADHD Son   . Hypertension Maternal Uncle   . Heart attack Brother   . Sudden death Neg Hx     Social History Social History   Tobacco Use  . Smoking status: Never Smoker  . Smokeless tobacco: Never Used  Substance Use Topics  . Alcohol use: Yes    Comment: occ  . Drug use: No    Allergies  Allergen Reactions  . Diclofenac Swelling    AND POSSIBLE SYNCOPE; tolerates ibuprofen per pt  . Tramadol Nausea And Vomiting    Itching (12/21); tolerates ibuprofen per pt  . Vicodin [Hydrocodone-Acetaminophen] Itching and Nausea Only    Current Outpatient Medications  Medication Sig Dispense Refill  . aspirin EC 81 MG tablet Take 81 mg by mouth daily.    . carvedilol (COREG) 25 MG tablet Take 1 tablet (25 mg total)  by mouth 2 (two) times daily with a meal. 60 tablet 12  . gabapentin (NEURONTIN) 300 MG capsule Take 600 mg by mouth at bedtime.     . hydrALAZINE (APRESOLINE) 50 MG tablet Take 1 tablet (50 mg total) by mouth 3 (three) times daily. 90 tablet 0  . insulin glargine (LANTUS) 100 UNIT/ML injection Inject 30 Units at bedtime into the skin.     Marland Kitchen insulin lispro (HUMALOG) 100 UNIT/ML injection Inject into the skin 3 (three) times daily before meals.    . Lancets (FREESTYLE) lancets Use as instructed 100 each 12  . metFORMIN (GLUCOPHAGE) 500 MG tablet Take 500 mg 2 (two) times daily with a meal by mouth.    . nabumetone (RELAFEN) 750  MG tablet Take 1 tablet (750 mg total) by mouth 2 (two) times daily. One by mouth twice a day after eating. 60 tablet 5  . nitroGLYCERIN (NITROSTAT) 0.4 MG SL tablet Place 1 tablet (0.4 mg total) under the tongue every 5 (five) minutes as needed for chest pain. 30 tablet 12  . potassium chloride SA (K-DUR,KLOR-CON) 20 MEQ tablet Take 1 tablet (20 mEq total) by mouth daily. 30 tablet 0  . sacubitril-valsartan (ENTRESTO) 97-103 MG Take 1 tablet by mouth 2 (two) times daily. 60 tablet 0  . spironolactone (ALDACTONE) 25 MG tablet Take 1 tablet (25 mg total) by mouth daily. 90 tablet 3  . torsemide (DEMADEX) 20 MG tablet Please take 40 mg oral every morning and 20 mg every evening. (Patient taking differently: Take 20-40 mg 2 (two) times daily by mouth. Please take 40 mg oral every morning and 20 mg every evening.) 90 tablet 0   No current facility-administered medications for this visit.      Physical Exam  Blood pressure (!) 144/92, pulse 92, height 5\' 6"  (1.676 m), weight 240 lb (108.9 kg).  Constitutional: overall normal hygiene, normal nutrition, well developed, normal grooming, normal body habitus. Assistive device:none  Musculoskeletal: gait and station Limp right, muscle tone and strength are normal, no tremors or atrophy is present.  .  Neurological: coordination overall normal.  Deep tendon reflex/nerve stretch intact.  Sensation normal.  Cranial nerves II-XII intact.   Skin:   Normal overall no scars, lesions, ulcers or rashes. No psoriasis.  Psychiatric: Alert and oriented x 3.  Recent memory intact, remote memory unclear.  Normal mood and affect. Well groomed.  Good eye contact.  Cardiovascular: overall no swelling, no varicosities, no edema bilaterally, normal temperatures of the legs and arms, no clubbing, cyanosis and good capillary refill.  Lymphatic: palpation is normal.  Right knee is tender.  ROM is O to 105, crepitus, medial joint line pain, limp to the right.  All  other systems reviewed and are negative   The patient has been educated about the nature of the problem(s) and counseled on treatment options.  The patient appeared to understand what I have discussed and is in agreement with it.  Encounter Diagnosis  Name Primary?  . Chronic pain of right knee Yes    PLAN Call if any problems.  Precautions discussed.  Change to Relafen.  Return to clinic 1 month   Electronically Signed Sanjuana Kava, MD 1/23/20199:14 AM

## 2017-06-08 NOTE — Telephone Encounter (Signed)
Per [facility] and [facility], [employer] will not cover Nabumetone 750 mgs.  Medicaid WILL pay for one of the following medications:  Ibuprofen Toradol Meloxicam Naproxen  Would you send in one of these medications to [place] for this patient?  Thanks

## 2017-06-27 ENCOUNTER — Other Ambulatory Visit (HOSPITAL_BASED_OUTPATIENT_CLINIC_OR_DEPARTMENT_OTHER): Payer: Self-pay

## 2017-06-27 DIAGNOSIS — G4730 Sleep apnea, unspecified: Secondary | ICD-10-CM

## 2017-06-27 DIAGNOSIS — R0683 Snoring: Secondary | ICD-10-CM

## 2017-07-04 ENCOUNTER — Ambulatory Visit: Payer: Medicaid Other | Attending: Pulmonary Disease | Admitting: Neurology

## 2017-07-04 DIAGNOSIS — Z791 Long term (current) use of non-steroidal anti-inflammatories (NSAID): Secondary | ICD-10-CM | POA: Diagnosis not present

## 2017-07-04 DIAGNOSIS — Z794 Long term (current) use of insulin: Secondary | ICD-10-CM | POA: Insufficient documentation

## 2017-07-04 DIAGNOSIS — G4733 Obstructive sleep apnea (adult) (pediatric): Secondary | ICD-10-CM | POA: Diagnosis not present

## 2017-07-04 DIAGNOSIS — Z79899 Other long term (current) drug therapy: Secondary | ICD-10-CM | POA: Insufficient documentation

## 2017-07-04 DIAGNOSIS — Z7982 Long term (current) use of aspirin: Secondary | ICD-10-CM | POA: Insufficient documentation

## 2017-07-04 DIAGNOSIS — G4730 Sleep apnea, unspecified: Secondary | ICD-10-CM

## 2017-07-04 DIAGNOSIS — R0683 Snoring: Secondary | ICD-10-CM | POA: Diagnosis present

## 2017-07-06 ENCOUNTER — Encounter: Payer: Self-pay | Admitting: Orthopaedic Surgery

## 2017-07-06 ENCOUNTER — Ambulatory Visit: Payer: Medicaid Other | Admitting: Orthopaedic Surgery

## 2017-07-06 VITALS — BP 192/100 | Temp 98.2°F | Ht 66.0 in | Wt 248.0 lb

## 2017-07-06 DIAGNOSIS — M7061 Trochanteric bursitis, right hip: Secondary | ICD-10-CM

## 2017-07-06 DIAGNOSIS — M25561 Pain in right knee: Secondary | ICD-10-CM

## 2017-07-06 DIAGNOSIS — G8929 Other chronic pain: Secondary | ICD-10-CM

## 2017-07-06 MED ORDER — HYDROCODONE-ACETAMINOPHEN 5-325 MG PO TABS: ORAL_TABLET | ORAL | 0 refills | Status: DC

## 2017-07-06 NOTE — Progress Notes (Signed)
Patient Chelsey Santiago, female DOB:Mar 24, 1976, 42 y.o. NUU:[id number]  Chief Complaint  Patient presents with  . Knee Pain    right    HPI  Chelsey Santiago is a 42 y.o. female who has chronic pain of the left knee.  She has swelling and popping but no giving way.  She has no new trauma.  She is taking her medicine.  She has developed pain of the right hip over the last three weeks. It is laterally.  It hurts to get walking. It hurts after arising from a chair.  She has no trauma, no redness, no paresthesias.  Nothing seems to help it, no ice, heat or rubs. HPI  Body mass index is 40.03 kg/m.  ROS  Review of Systems  HENT: Negative for congestion.   Respiratory: Negative for cough and shortness of breath.   Cardiovascular: Negative for chest pain and leg swelling.  Endocrine: Positive for cold intolerance.  Musculoskeletal: Positive for arthralgias, gait problem and joint swelling.  Allergic/Immunologic: Positive for environmental allergies.  Neurological: Positive for headaches.  Psychiatric/Behavioral: The patient is nervous/anxious.   All other systems reviewed and are negative.   Past Medical History:  Diagnosis Date  . Anemia    H&H of 10.6/33 and 07/2008 and 11.9/35 and 09/2010  . Anxiety   . CHF (congestive heart failure) ([place])   . Depression with anxiety   . Diabetes mellitus without complication ([place])   . Enlarged heart   . Fasting hyperglycemia   . Hypertension    Lab: Normal BMet except glucose of 118 in 09/2010  . Hypertensive heart disease 2009   Pulmonary edema postpartum; mild to moderate mitral regurgitation when hospitalized for CHF in 2009; Echocardiogram in 12/2009-no MR and normal EF; normal CXR in 09/2010  . Migraine headache   . Miscarriage 03/19/2013  . Obesity 04/16/2009  . Osteoarthritis, knee 03/29/2011  . Preeclampsia   . Pregnant   . Pulmonary edema   . Sleep apnea   . Threatened abortion in early pregnancy 03/15/2013    Past Surgical  History:  Procedure Laterality Date  . BREAST REDUCTION SURGERY  2002  . CARDIAC CATHETERIZATION N/A 12/22/2015   Procedure: Left Heart Cath and Coronary Angiography;  Surgeon: Peter M Martinique, MD;  Location: [facility];  Service: Cardiovascular;  Laterality: N/A;  . CESAREAN SECTION N/A 04/09/2014   Procedure: CESAREAN SECTION;  Surgeon: Mora Bellman, MD;  Location: [facility];  Service: Obstetrics;  Laterality: N/A;  . CHOLECYSTECTOMY      Family History  Problem Relation Age of Onset  . Diabetes Mother   . Heart disease Mother   . Hyperlipidemia Paternal Grandfather   . Hypertension Paternal Grandfather   . Heart disease Father   . Hypertension Father   . Heart disease Maternal Grandmother   . ADD / ADHD Son   . Hypertension Maternal Uncle   . Heart attack Brother   . Sudden death Neg Hx     Social History Social History   Tobacco Use  . Smoking status: Never Smoker  . Smokeless tobacco: Never Used  Substance Use Topics  . Alcohol use: Yes    Comment: occ  . Drug use: No    Allergies  Allergen Reactions  . Diclofenac Swelling    AND POSSIBLE SYNCOPE; tolerates ibuprofen per pt  . Tramadol Nausea And Vomiting    Itching (12/21); tolerates ibuprofen per pt  . Vicodin [Hydrocodone-Acetaminophen] Itching and Nausea Only    Current Outpatient  Medications  Medication Sig Dispense Refill  . aspirin EC 81 MG tablet Take 81 mg by mouth daily.    . carvedilol (COREG) 25 MG tablet Take 1 tablet (25 mg total) by mouth 2 (two) times daily with a meal. 60 tablet 12  . gabapentin (NEURONTIN) 300 MG capsule Take 600 mg by mouth at bedtime.     . hydrALAZINE (APRESOLINE) 50 MG tablet Take 1 tablet (50 mg total) by mouth 3 (three) times daily. 90 tablet 0  . HYDROcodone-acetaminophen (NORCO/VICODIN) 5-325 MG tablet One tablet every four hours as needed for acute pain.  Limit of five days per [place]. 30 tablet 0  . insulin glargine (LANTUS) 100 UNIT/ML injection  Inject 30 Units at bedtime into the skin.     Marland Kitchen insulin lispro (HUMALOG) 100 UNIT/ML injection Inject into the skin 3 (three) times daily before meals.    . Lancets (FREESTYLE) lancets Use as instructed 100 each 12  . meloxicam (MOBIC) 15 MG tablet Take 1 tablet (15 mg total) by mouth daily. 30 tablet 54  . metFORMIN (GLUCOPHAGE) 500 MG tablet Take 500 mg 2 (two) times daily with a meal by mouth.    . nabumetone (RELAFEN) 750 MG tablet Take 1 tablet (750 mg total) by mouth 2 (two) times daily. One by mouth twice a day after eating. 60 tablet 5  . nitroGLYCERIN (NITROSTAT) 0.4 MG SL tablet Place 1 tablet (0.4 mg total) under the tongue every 5 (five) minutes as needed for chest pain. 30 tablet 12  . potassium chloride SA (K-DUR,KLOR-CON) 20 MEQ tablet Take 1 tablet (20 mEq total) by mouth daily. 30 tablet 0  . sacubitril-valsartan (ENTRESTO) 97-103 MG Take 1 tablet by mouth 2 (two) times daily. 60 tablet 0  . spironolactone (ALDACTONE) 25 MG tablet Take 1 tablet (25 mg total) by mouth daily. 90 tablet 3  . torsemide (DEMADEX) 20 MG tablet Please take 40 mg oral every morning and 20 mg every evening. (Patient taking differently: Take 20-40 mg 2 (two) times daily by mouth. Please take 40 mg oral every morning and 20 mg every evening.) 90 tablet 0   No current facility-administered medications for this visit.      Physical Exam  Blood pressure (!) 192/100, temperature 98.2 F (36.8 C), height 5\' 6"  (1.676 m), weight 248 lb (112.5 kg).  Constitutional: overall normal hygiene, normal nutrition, well developed, normal grooming, normal body habitus. Assistive device:none  Musculoskeletal: gait and station Limp left, muscle tone and strength are normal, no tremors or atrophy is present.  .  Neurological: coordination overall normal.  Deep tendon reflex/nerve stretch intact.  Sensation normal.  Cranial nerves II-XII intact.   Skin:   Normal overall no scars, lesions, ulcers or rashes. No  psoriasis.  Psychiatric: Alert and oriented x 3.  Recent memory intact, remote memory unclear.  Normal mood and affect. Well groomed.  Good eye contact.  Cardiovascular: overall no swelling, no varicosities, no edema bilaterally, normal temperatures of the legs and arms, no clubbing, cyanosis and good capillary refill.  Lymphatic: palpation is normal.  The right hip is very tender over the lateral trochanteric area.  She has no redness or swelling.  She has full ROM of the hip.  NV intact.  The left lower extremity is examined:  Inspection:  Thigh:  Non-tender and no defects  Knee has swelling 1+ effusion.  Joint tenderness is present                        Patient is tender over the medial joint line  Lower Leg:  Has normal appearance and no tenderness or defects  Ankle:  Non-tender and no defects  Foot:  Non-tender and no defects Range of Motion:  Knee:  Range of motion is: 0-105                        Crepitus is  present  Ankle:  Range of motion is normal. Strength and Tone:  The left lower extremity has normal strength and tone. Stability:  Knee:  The knee is stable.  Ankle:  The ankle is stable.   All other systems reviewed and are negative   The patient has been educated about the nature of the problem(s) and counseled on treatment options.  The patient appeared to understand what I have discussed and is in agreement with it.  Encounter Diagnoses  Name Primary?  . Trochanteric bursitis of right hip Yes  . Chronic pain of right knee     PROCEDURE NOTE:  The patient request injection, verbal consent was obtained.  The right trochanteric area of the hip was prepped appropriately after time out was performed.   Sterile technique was observed and injection of 1 cc of Depo-Medrol 40 mg with several cc's of plain xylocaine. Anesthesia was provided by ethyl chloride and a 20-gauge needle was used to inject the hip area. The injection was tolerated  well.  A band aid dressing was applied.  The patient was advised to apply ice later today and tomorrow to the injection sight as needed.  PLAN Call if any problems.  Precautions discussed.  Continue current medications.   Return to clinic 3 months   I have reviewed the [facility] web site prior to prescribing narcotic medicine for this patient.  Electronically Signed Sanjuana Kava, MD 2/20/20199:10 AM

## 2017-07-07 ENCOUNTER — Telehealth: Payer: Self-pay

## 2017-07-07 ENCOUNTER — Ambulatory Visit: Payer: Medicaid Other | Admitting: Cardiology

## 2017-07-07 ENCOUNTER — Other Ambulatory Visit (HOSPITAL_COMMUNITY)
Admission: RE | Admit: 2017-07-07 | Discharge: 2017-07-07 | Disposition: A | Discharge status: Home / Self Care | Payer: Medicaid Other | Source: Ambulatory Visit | Attending: Cardiology | Admitting: Cardiology

## 2017-07-07 ENCOUNTER — Encounter: Payer: Self-pay | Admitting: Cardiology

## 2017-07-07 VITALS — BP 152/100 | HR 77 | Ht 66.0 in | Wt 244.0 lb

## 2017-07-07 DIAGNOSIS — I10 Essential (primary) hypertension: Secondary | ICD-10-CM | POA: Diagnosis not present

## 2017-07-07 DIAGNOSIS — Z79899 Other long term (current) drug therapy: Secondary | ICD-10-CM

## 2017-07-07 DIAGNOSIS — I5022 Chronic systolic (congestive) heart failure: Secondary | ICD-10-CM

## 2017-07-07 LAB — BASIC METABOLIC PANEL
Anion gap: 13 (ref 5–15)
BUN: 18 mg/dL (ref 6–20)
CO2: 28 mmol/L (ref 22–32)
Calcium: 9.4 mg/dL (ref 8.9–10.3)
Chloride: 97 mmol/L — ABNORMAL LOW (ref 101–111)
Creatinine, Ser: 0.86 mg/dL (ref 0.44–1.00)
Glucose, Bld: 276 mg/dL — ABNORMAL HIGH (ref 65–99)
POTASSIUM: 3.8 mmol/L (ref 3.5–5.1)
SODIUM: 138 mmol/L (ref 135–145)

## 2017-07-07 LAB — MAGNESIUM: MAGNESIUM: 1.5 mg/dL — AB (ref 1.7–2.4)

## 2017-07-07 MED ORDER — HYDRALAZINE HCL 50 MG PO TABS: 75.0000 mg | ORAL_TABLET | Freq: Three times a day (TID) | ORAL | 3 refills | Status: DC

## 2017-07-07 MED ORDER — MAGNESIUM 400 MG PO TABS: 400.0000 mg | ORAL_TABLET | ORAL | 3 refills | Status: DC

## 2017-07-07 NOTE — Telephone Encounter (Signed)
Results given to patient

## 2017-07-07 NOTE — Patient Instructions (Addendum)
Your physician wants you to follow-up in: 4 months With Dr Harl Bowie     Your physician has requested that you have an echocardiogram. Echocardiography is a painless test that uses sound waves to create images of your heart. It provides your doctor with information about the size and shape of your heart and how well your heart's chambers and valves are working. This procedure takes approximately one hour. There are no restrictions for this procedure.  INCREASE Hydralazine to 75 mg ( 1 1/2 pills)   3 times a day   Get BMET, magnesium lab work    Thank you for choosing [facility] !

## 2017-07-07 NOTE — Telephone Encounter (Signed)
-----   Message from Arnoldo Lenis, MD sent at 07/07/2017 12:47 PM EST ----- Magnesium is low, start magnesium oxide 400mg  bid x 4 days, then 400mg  daily   J BrancH MD

## 2017-07-07 NOTE — Progress Notes (Signed)
Clinical Summary [name] is a 42 y.o.female seen today for follow up of the following medical problems.  1. HTN - history of poor medication compliance, though she reports recent compliance.   - compliant with meds - not checking bp at home.    2. Chronic systolic heart failure - 06/6710 echo: LVEF 40-45%, grade II diastolic dysfunction - 08/5807 cath with normal coronaries   - seen at [facility] ER for SOB and HTN. Given IV NG and IV lasix, discharged from ER - since then still with some SOB. Recent edema. Recently doubled fluid pills with improvement. Takes 40mg  in AM and 20mg  in PM, last few days taking 40mg  bid x 3 days. with some improvement.  - compliant with coreg,  - headaches on long acting nitrate and stopped taking. She is unsure what happened with her aldactone.   - restartred aldactone last visit in Jan 2019 - occasional LE edema. SOB at times.   3. OSA - mixed compliance with CPAP - had sleep study this week.  - followed by Dr Luan Pulling Past Medical History:  Diagnosis Date  . Anemia    H&H of 10.6/33 and 07/2008 and 11.9/35 and 09/2010  . Anxiety   . CHF (congestive heart failure) ([place])   . Depression with anxiety   . Diabetes mellitus without complication ([place])   . Enlarged heart   . Fasting hyperglycemia   . Hypertension    Lab: Normal BMet except glucose of 118 in 09/2010  . Hypertensive heart disease 2009   Pulmonary edema postpartum; mild to moderate mitral regurgitation when hospitalized for CHF in 2009; Echocardiogram in 12/2009-no MR and normal EF; normal CXR in 09/2010  . Migraine headache   . Miscarriage 03/19/2013  . Obesity 04/16/2009  . Osteoarthritis, knee 03/29/2011  . Preeclampsia   . Pregnant   . Pulmonary edema   . Sleep apnea   . Threatened abortion in early pregnancy 03/15/2013     Allergies  Allergen Reactions  . Diclofenac Swelling    AND POSSIBLE SYNCOPE; tolerates ibuprofen per pt  . Tramadol Nausea And Vomiting   Itching (12/21); tolerates ibuprofen per pt  . Vicodin [Hydrocodone-Acetaminophen] Itching and Nausea Only     Current Outpatient Medications  Medication Sig Dispense Refill  . aspirin EC 81 MG tablet Take 81 mg by mouth daily.    . carvedilol (COREG) 25 MG tablet Take 1 tablet (25 mg total) by mouth 2 (two) times daily with a meal. 60 tablet 12  . gabapentin (NEURONTIN) 300 MG capsule Take 600 mg by mouth at bedtime.     . hydrALAZINE (APRESOLINE) 50 MG tablet Take 1 tablet (50 mg total) by mouth 3 (three) times daily. 90 tablet 0  . HYDROcodone-acetaminophen (NORCO/VICODIN) 5-325 MG tablet One tablet every four hours as needed for acute pain.  Limit of five days per [place] statue. 30 tablet 0  . insulin glargine (LANTUS) 100 UNIT/ML injection Inject 30 Units at bedtime into the skin.     Marland Kitchen insulin lispro (HUMALOG) 100 UNIT/ML injection Inject into the skin 3 (three) times daily before meals.    . Lancets (FREESTYLE) lancets Use as instructed 100 each 12  . meloxicam (MOBIC) 15 MG tablet Take 1 tablet (15 mg total) by mouth daily. 30 tablet 54  . metFORMIN (GLUCOPHAGE) 500 MG tablet Take 500 mg 2 (two) times daily with a meal by mouth.    . nabumetone (RELAFEN) 750 MG tablet Take 1 tablet (750  mg total) by mouth 2 (two) times daily. One by mouth twice a day after eating. 60 tablet 5  . nitroGLYCERIN (NITROSTAT) 0.4 MG SL tablet Place 1 tablet (0.4 mg total) under the tongue every 5 (five) minutes as needed for chest pain. 30 tablet 12  . potassium chloride SA (K-DUR,KLOR-CON) 20 MEQ tablet Take 1 tablet (20 mEq total) by mouth daily. 30 tablet 0  . sacubitril-valsartan (ENTRESTO) 97-103 MG Take 1 tablet by mouth 2 (two) times daily. 60 tablet 0  . spironolactone (ALDACTONE) 25 MG tablet Take 1 tablet (25 mg total) by mouth daily. 90 tablet 3  . torsemide (DEMADEX) 20 MG tablet Please take 40 mg oral every morning and 20 mg every evening. (Patient taking differently: Take 20-40 mg 2 (two)  times daily by mouth. Please take 40 mg oral every morning and 20 mg every evening.) 90 tablet 0   No current facility-administered medications for this visit.      Past Surgical History:  Procedure Laterality Date  . BREAST REDUCTION SURGERY  2002  . CARDIAC CATHETERIZATION N/A 12/22/2015   Procedure: Left Heart Cath and Coronary Angiography;  Surgeon: Peter M Martinique, MD;  Location: [facility];  Service: Cardiovascular;  Laterality: N/A;  . CESAREAN SECTION N/A 04/09/2014   Procedure: CESAREAN SECTION;  Surgeon: Mora Bellman, MD;  Location: [facility];  Service: Obstetrics;  Laterality: N/A;  . CHOLECYSTECTOMY       Allergies  Allergen Reactions  . Diclofenac Swelling    AND POSSIBLE SYNCOPE; tolerates ibuprofen per pt  . Tramadol Nausea And Vomiting    Itching (12/21); tolerates ibuprofen per pt  . Vicodin [Hydrocodone-Acetaminophen] Itching and Nausea Only      Family History  Problem Relation Age of Onset  . Diabetes Mother   . Heart disease Mother   . Hyperlipidemia Paternal Grandfather   . Hypertension Paternal Grandfather   . Heart disease Father   . Hypertension Father   . Heart disease Maternal Grandmother   . ADD / ADHD Son   . Hypertension Maternal Uncle   . Heart attack Brother   . Sudden death Neg Hx      Social History [name] reports that  has never smoked. she has never used smokeless tobacco. [name] reports that she drinks alcohol.   Review of Systems CONSTITUTIONAL: No weight loss, fever, chills, weakness or fatigue.  HEENT: Eyes: No visual loss, blurred vision, double vision or yellow sclerae.No hearing loss, sneezing, congestion, runny nose or sore throat.  SKIN: No rash or itching.  CARDIOVASCULAR: per hpi RESPIRATORY:per hpi GASTROINTESTINAL: No anorexia, nausea, vomiting or diarrhea. No abdominal pain or blood.  GENITOURINARY: No burning on urination, no polyuria NEUROLOGICAL: No headache, dizziness, syncope, paralysis,  ataxia, numbness or tingling in the extremities. No change in bowel or bladder control.  MUSCULOSKELETAL: No muscle, back pain, joint pain or stiffness.  LYMPHATICS: No enlarged nodes. No history of splenectomy.  PSYCHIATRIC: No history of depression or anxiety.  ENDOCRINOLOGIC: No reports of sweating, cold or heat intolerance. No polyuria or polydipsia.  Marland Kitchen   Physical Examination Vitals:   07/07/17 1013  BP: (!) 152/100  Pulse: 77  SpO2: 99%   Vitals:   07/07/17 1013  Weight: 244 lb (110.7 kg)  Height: 5\' 6"  (1.676 m)    Gen: resting comfortably, no acute distress HEENT: no scleral icterus, pupils equal round and reactive, no palptable cervical adenopathy,  CV: RRR, no m/r/g, no jvd Resp: Clear to auscultation  bilaterally GI: abdomen is soft, non-tender, non-distended, normal bowel sounds, no hepatosplenomegaly MSK: extremities are warm, no edema.  Skin: warm, no rash Neuro:  no focal deficits Psych: appropriate affect   Diagnostic Studies 10/2014 echo Study Conclusions  - Left ventricle: The cavity size was normal. There was moderate concentric hypertrophy. Systolic function was moderately reduced. The estimated ejection fraction was in the range of 35% to 40%. Images were suboptimal for LV wall motion assessment. There appeared to be global hypokinesis. Doppler parameters are consistent with abnormal left ventricular relaxation (grade 1 diastolic dysfunction). - Mitral valve: There was trivial regurgitation. - Left atrium: The atrium was mildly dilated.   12/2015 cath  The left ventricular systolic function is normal.  LV end diastolic pressure is mildly elevated.  The left ventricular ejection fraction is 50-55% by visual estimate.  1. No significant CAD 2. Good LV function 3. Mildly elevated LV EDP.   03/2016 cardiac MRI IMPRESSION: 1. Technically difficult study.  2. Normal LV size with moderate LV hypertrophy, EF 35%.  3.  Mildly dilated RV with mildly decreased RV systolic function.  4. No myocardial LGE, so no definitive evidence for prior myocardial infarction, infiltrative disease, or myocarditis.    Assessment and Plan  1.HTN - elevated in clinic. We will increase hydralazine to 75mg  tid.  - repeat BMET/Mg since staring aldactone.   2. Chronic systolic heart failure - some increase in LE edema, we will repeat echo to evaluate for worsening of her cardiac function   3. OSA - continue to follow with Dr Luan Pulling      Arnoldo Lenis, M.D.

## 2017-07-11 ENCOUNTER — Ambulatory Visit (HOSPITAL_COMMUNITY)
Admission: RE | Admit: 2017-07-11 | Discharge: 2017-07-11 | Disposition: A | Discharge status: Home / Self Care | Payer: Medicaid Other | Source: Ambulatory Visit | Attending: Cardiology | Admitting: Cardiology

## 2017-07-11 ENCOUNTER — Telehealth: Payer: Self-pay | Admitting: Cardiology

## 2017-07-11 ENCOUNTER — Inpatient Hospital Stay (HOSPITAL_COMMUNITY): Payer: Medicaid Other | Attending: Oncology

## 2017-07-11 DIAGNOSIS — E119 Type 2 diabetes mellitus without complications: Secondary | ICD-10-CM | POA: Insufficient documentation

## 2017-07-11 DIAGNOSIS — I110 Hypertensive heart disease with heart failure: Secondary | ICD-10-CM | POA: Diagnosis not present

## 2017-07-11 DIAGNOSIS — I429 Cardiomyopathy, unspecified: Secondary | ICD-10-CM | POA: Insufficient documentation

## 2017-07-11 DIAGNOSIS — I252 Old myocardial infarction: Secondary | ICD-10-CM | POA: Insufficient documentation

## 2017-07-11 DIAGNOSIS — I080 Rheumatic disorders of both mitral and aortic valves: Secondary | ICD-10-CM | POA: Insufficient documentation

## 2017-07-11 DIAGNOSIS — I5022 Chronic systolic (congestive) heart failure: Secondary | ICD-10-CM | POA: Insufficient documentation

## 2017-07-11 DIAGNOSIS — G4733 Obstructive sleep apnea (adult) (pediatric): Secondary | ICD-10-CM | POA: Insufficient documentation

## 2017-07-11 DIAGNOSIS — D509 Iron deficiency anemia, unspecified: Secondary | ICD-10-CM | POA: Diagnosis present

## 2017-07-11 DIAGNOSIS — Z6839 Body mass index (BMI) 39.0-39.9, adult: Secondary | ICD-10-CM | POA: Insufficient documentation

## 2017-07-11 DIAGNOSIS — E669 Obesity, unspecified: Secondary | ICD-10-CM | POA: Insufficient documentation

## 2017-07-11 LAB — COMPREHENSIVE METABOLIC PANEL
ALBUMIN: 3.9 g/dL (ref 3.5–5.0)
ALT: 11 U/L — ABNORMAL LOW (ref 14–54)
AST: 11 U/L — AB (ref 15–41)
Alkaline Phosphatase: 67 U/L (ref 38–126)
Anion gap: 11 (ref 5–15)
BILIRUBIN TOTAL: 0.2 mg/dL — AB (ref 0.3–1.2)
BUN: 23 mg/dL — AB (ref 6–20)
CO2: 27 mmol/L (ref 22–32)
Calcium: 10.1 mg/dL (ref 8.9–10.3)
Chloride: 99 mmol/L — ABNORMAL LOW (ref 101–111)
Creatinine, Ser: 0.92 mg/dL (ref 0.44–1.00)
GFR calc Af Amer: >60 mL/min (ref >60)
GFR calc non Af Amer: >60 mL/min (ref >60)
GLUCOSE: 176 mg/dL — AB (ref 65–99)
Potassium: 3.9 mmol/L (ref 3.5–5.1)
Sodium: 137 mmol/L (ref 135–145)
TOTAL PROTEIN: 7.8 g/dL (ref 6.5–8.1)

## 2017-07-11 LAB — IRON AND TIBC
Iron: 81 ug/dL (ref 28–170)
Saturation Ratios: 24 % (ref 10.4–31.8)
TIBC: 339 ug/dL (ref 250–450)
UIBC: 258 ug/dL

## 2017-07-11 LAB — CBC WITH DIFFERENTIAL/PLATELET
BASOS ABS: 0 10*3/uL (ref 0.0–0.1)
BASOS PCT: 0 %
Eosinophils Absolute: 0.1 10*3/uL (ref 0.0–0.7)
Eosinophils Relative: 1 %
HEMATOCRIT: 36.5 % (ref 36.0–46.0)
HEMOGLOBIN: 10.9 g/dL — AB (ref 12.0–15.0)
Lymphocytes Relative: 26 %
Lymphs Abs: 3.1 10*3/uL (ref 0.7–4.0)
MCH: 23 pg — ABNORMAL LOW (ref 26.0–34.0)
MCHC: 29.9 g/dL — ABNORMAL LOW (ref 30.0–36.0)
MCV: 77.2 fL — ABNORMAL LOW (ref 78.0–100.0)
MONO ABS: 0.7 10*3/uL (ref 0.1–1.0)
Monocytes Relative: 6 %
NEUTROS ABS: 8 10*3/uL — AB (ref 1.7–7.7)
NEUTROS PCT: 67 %
Platelets: 228 10*3/uL (ref 150–400)
RBC: 4.73 MIL/uL (ref 3.87–5.11)
RDW: 17.1 % — AB (ref 11.5–15.5)
WBC: 11.8 10*3/uL — AB (ref 4.0–10.5)

## 2017-07-11 LAB — ECHOCARDIOGRAM COMPLETE
AVLVOTPG: 5 mmHg
E decel time: 197 msec
E/e' ratio: 18.9
FS: 11 % — AB (ref 28–44)
IVS/LV PW RATIO, ED: 0.97
LA ID, A-P, ES: 48 mm
LA vol index: 40.3 mL/m2
LADIAMINDEX: 2.07 cm/m2
LAVOL: 93.5 mL
LAVOLA4C: 89.4 mL
LEFT ATRIUM END SYS DIAM: 48 mm
LV E/e'average: 18.9
LV SIMPSON'S DISK: 39
LV TDI E'LATERAL: 5.98
LV TDI E'MEDIAL: 5.66
LV dias vol index: 59 mL/m2
LV dias vol: 137 mL — AB (ref 46–106)
LV sys vol index: 36 mL/m2
LV sys vol: 83 mL — AB (ref 14–42)
LVEEMED: 18.9
LVELAT: 5.98 cm/s
LVOT SV: 58 mL
LVOT VTI: 20.4 cm
LVOT area: 2.84 cm2
LVOT peak vel: 116 cm/s
LVOTD: 19 mm
Lateral S' vel: 10.2 cm/s
MV Dec: 197
MVPG: 5 mmHg
MVPKAVEL: 84.8 m/s
MVPKEVEL: 113 m/s
PW: 14 mm — AB (ref 0.6–1.1)
Stroke v: 54 ml
TAPSE: 23.4 mm

## 2017-07-11 LAB — FERRITIN: Ferritin: 78 ng/mL (ref 11–307)

## 2017-07-11 MED ORDER — MAGNESIUM 400 MG PO TABS: 400.0000 mg | ORAL_TABLET | ORAL | 3 refills | Status: DC

## 2017-07-11 NOTE — Progress Notes (Signed)
*  PRELIMINARY RESULTS* Echocardiogram 2D Echocardiogram has been performed.  Chelsey Santiago 07/11/2017, 1:55 PM

## 2017-07-11 NOTE — Telephone Encounter (Signed)
rx sent to [facility]

## 2017-07-12 ENCOUNTER — Encounter: Payer: Self-pay | Admitting: Cardiology

## 2017-07-12 ENCOUNTER — Telehealth: Payer: Self-pay

## 2017-07-12 MED ORDER — HYDRALAZINE HCL 100 MG PO TABS
100.0000 mg | ORAL_TABLET | Freq: Three times a day (TID) | ORAL | 3 refills | Status: DC
Start: 2017-07-12 — End: 2018-06-03

## 2017-07-12 NOTE — Telephone Encounter (Signed)
Called pt., no answer. Left message for pt to return call. Sent new RX in for 100 mg hydralazine TID.

## 2017-07-12 NOTE — Telephone Encounter (Signed)
Can we have her increase her hydrlazine to 100mg  tid  J Yentl Verge MD

## 2017-07-12 NOTE — Addendum Note (Signed)
Addended by: Debbora Lacrosse R on: 07/12/2017 03:55 PM   Modules accepted: Orders

## 2017-07-12 NOTE — Procedures (Signed)
[place] A. Merlene Laughter, MD     [email or web address]             NOCTURNAL POLYSOMNOGRAPHY   LOCATION: [facility]   Patient Name: Chelsey Santiago, Chelsey Santiago Date: 07/04/2017 Gender: Female D.O.B: 03/19/1976 Age (years): 42 Referring Provider: Sinda Du Height (inches): 66 Interpreting Physician: Phillips Odor MD, ABSM Weight (lbs): 240 RPSGT: Rosebud Poles BMI: 39 MRN: [id number] Neck Size: 16.50 CLINICAL INFORMATION Sleep Study Type: NPSG     Indication for sleep study: Snoring     Epworth Sleepiness Score: 21     SLEEP STUDY TECHNIQUE As per the AASM Manual for the Scoring of Sleep and Associated Events v2.3 (April 2016) with a hypopnea requiring 4% desaturations.  The channels recorded and monitored were frontal, central and occipital EEG, electrooculogram (EOG), submentalis EMG (chin), nasal and oral airflow, thoracic and abdominal wall motion, anterior tibialis EMG, snore microphone, electrocardiogram, and pulse oximetry.  MEDICATIONS Medications self-administered by patient taken the night of the study : N/A  Current Outpatient Medications:  .  aspirin EC 81 MG tablet, Take 81 mg by mouth daily., Disp: , Rfl:  .  carvedilol (COREG) 25 MG tablet, Take 1 tablet (25 mg total) by mouth 2 (two) times daily with a meal., Disp: 60 tablet, Rfl: 12 .  gabapentin (NEURONTIN) 300 MG capsule, Take 600 mg by mouth at bedtime. , Disp: , Rfl:  .  hydrALAZINE (APRESOLINE) 100 MG tablet, Take 1 tablet (100 mg total) by mouth 3 (three) times daily., Disp: 270 tablet, Rfl: 3 .  HYDROcodone-acetaminophen (NORCO/VICODIN) 5-325 MG tablet, One tablet every four hours as needed for acute pain.  Limit of five days per [place]., Disp: 30 tablet, Rfl: 0 .  insulin glargine (LANTUS) 100 UNIT/ML injection, Inject 30 Units at bedtime into the skin. , Disp: , Rfl:  .  insulin lispro (HUMALOG) 100 UNIT/ML injection, Inject into the skin 3 (three) times daily  before meals., Disp: , Rfl:  .  Lancets (FREESTYLE) lancets, Use as instructed, Disp: 100 each, Rfl: 12 .  Magnesium 400 MG TABS, Take 400 mg by mouth as directed. Take 400 mg twice a day for 4 days, then take 400 mg daily, Disp: 100 tablet, Rfl: 3 .  meloxicam (MOBIC) 15 MG tablet, Take 1 tablet (15 mg total) by mouth daily., Disp: 30 tablet, Rfl: 54 .  metFORMIN (GLUCOPHAGE) 500 MG tablet, Take 500 mg 2 (two) times daily with a meal by mouth., Disp: , Rfl:  .  nabumetone (RELAFEN) 750 MG tablet, Take 1 tablet (750 mg total) by mouth 2 (two) times daily. One by mouth twice a day after eating., Disp: 60 tablet, Rfl: 5 .  nitroGLYCERIN (NITROSTAT) 0.4 MG SL tablet, Place 1 tablet (0.4 mg total) under the tongue every 5 (five) minutes as needed for chest pain., Disp: 30 tablet, Rfl: 12 .  potassium chloride SA (K-DUR,KLOR-CON) 20 MEQ tablet, Take 1 tablet (20 mEq total) by mouth daily., Disp: 30 tablet, Rfl: 0 .  sacubitril-valsartan (ENTRESTO) 97-103 MG, Take 1 tablet by mouth 2 (two) times daily., Disp: 60 tablet, Rfl: 0 .  spironolactone (ALDACTONE) 25 MG tablet, Take 1 tablet (25 mg total) by mouth daily., Disp: 90 tablet, Rfl: 3 .  torsemide (DEMADEX) 20 MG tablet, Please take 40 mg oral every morning and 20 mg every evening. (Patient taking differently: Take 20-40 mg 2 (two) times daily by mouth. Please take 40 mg oral every morning and 20 mg every evening.), Disp:  90 tablet, Rfl: 0     SLEEP ARCHITECTURE The study was initiated at 9:49:50 PM and ended at 4:59:02 AM.  Sleep onset time was 12.2 minutes and the sleep efficiency was 92.0%. The total sleep time was 395.0 minutes.  Stage REM latency was 78.5 minutes.  The patient spent 1.27% of the night in stage N1 sleep, 46.08% in stage N2 sleep, 41.51% in stage N3 and 11.14% in REM.  Alpha intrusion was absent.  Supine sleep was 61.92%.  RESPIRATORY PARAMETERS The overall apnea/hypopnea index (AHI) was 15.8 per hour. There were 6 total  apneas, including 1 obstructive, 5 central and 0 mixed apneas. There were 98 hypopneas and 25 RERAs.  The AHI during Stage REM sleep was 45.0 per hour.  AHI while supine was 13.0 per hour.  The mean oxygen saturation was 92.74%. The minimum SpO2 during sleep was 77.00%.  moderate snoring was noted during this study.  CARDIAC DATA The 2 lead EKG demonstrated sinus rhythm. The mean heart rate was N/A beats per minute. Other EKG findings include: None. LEG MOVEMENT DATA The total PLMS were 0 with a resulting PLMS index of 0.00. Associated arousal with leg movement index was 0.0.  IMPRESSIONS Moderate obstructive sleep apnea worse during REM sleep is noted during this study.  AutoPap 9-14  Is recommended.  Delano Metz, MD Diplomate, American Board of Sleep Medicine. ELECTRONICALLY SIGNED ON:  07/12/2017, 10:54 PM [place] PH: [phone]   FX: [phone] [place]

## 2017-07-12 NOTE — Telephone Encounter (Signed)
Spoke with pt., advised her of results. She voiced understanding. She stated that she was very nervous at last appointment AND she has been taking her medications as directed.

## 2017-07-12 NOTE — Telephone Encounter (Signed)
-----   Message from Arnoldo Lenis, MD sent at 07/12/2017 12:56 PM EST ----- Heart function remains mildly weak around 40% (normal is 50-60%). Very high bp's at that visit, had she taken her meds that morning prior to the test?   Carlyle Dolly MD

## 2017-07-14 ENCOUNTER — Ambulatory Visit (HOSPITAL_COMMUNITY): Payer: Medicaid Other | Admitting: Internal Medicine

## 2017-07-28 ENCOUNTER — Other Ambulatory Visit: Payer: Self-pay

## 2017-07-28 ENCOUNTER — Inpatient Hospital Stay (HOSPITAL_COMMUNITY): Payer: Medicaid Other | Attending: Adult Health | Admitting: Internal Medicine

## 2017-07-28 ENCOUNTER — Encounter (HOSPITAL_COMMUNITY): Payer: Self-pay | Admitting: Internal Medicine

## 2017-07-28 ENCOUNTER — Other Ambulatory Visit (HOSPITAL_COMMUNITY): Payer: Self-pay | Admitting: Internal Medicine

## 2017-07-28 VITALS — BP 176/102 | HR 86 | Temp 97.5°F | Resp 18 | Wt 255.2 lb

## 2017-07-28 DIAGNOSIS — N920 Excessive and frequent menstruation with regular cycle: Secondary | ICD-10-CM | POA: Insufficient documentation

## 2017-07-28 DIAGNOSIS — I10 Essential (primary) hypertension: Secondary | ICD-10-CM | POA: Diagnosis not present

## 2017-07-28 DIAGNOSIS — D509 Iron deficiency anemia, unspecified: Secondary | ICD-10-CM | POA: Insufficient documentation

## 2017-07-28 DIAGNOSIS — E611 Iron deficiency: Secondary | ICD-10-CM

## 2017-07-28 DIAGNOSIS — I5022 Chronic systolic (congestive) heart failure: Secondary | ICD-10-CM | POA: Diagnosis not present

## 2017-07-28 DIAGNOSIS — R110 Nausea: Secondary | ICD-10-CM | POA: Diagnosis not present

## 2017-07-28 DIAGNOSIS — K5900 Constipation, unspecified: Secondary | ICD-10-CM | POA: Diagnosis not present

## 2017-07-28 NOTE — Progress Notes (Signed)
Diagnosis Iron deficiency - Plan: CBC with Differential/Platelet, Comprehensive metabolic panel, Ferritin, Lactate dehydrogenase  Staging Cancer Staging No matching staging information was found for the patient.  Assessment and Plan: 1.   IDA.  Patient was followed by Dr. Talbert Cage.  She was last treated with IV iron on 05/24/2017.  CBC done July 11, 2017 showed a white count of 11.8 hemoglobin 10.9 platelets 220,000.  Ferritin was 78.  She will return to clinic in 4 months for repeat labs.  She is referred to GI for evaluation.  She should also follow-up with GYN as recommended.  2.  Constipation.  The patient has a history of iron deficiency anemia.  She will be referred to GI for evaluation.  3.  Menorrhagia.  Patient should continue to follow-up with GYN as recommended.  This likely may be contributing to her iron deficiency anemia.  4.  Hypertension.  Blood pressure is 198/116.  Repeat check was 176/102.  We have contacted her primary care physician's office and they have instructed that she report to the emergency room for evaluation.  The patient was informed of this on leaving the clinic.  We have cautioned her that her blood pressure elevations could contribute to stroke.  5.  Chronic systolic heart failure.  She is followed by cardiology.  Heart rate was 88 in the clinic today.  Interval History:  42 y.o. female seen initially by Dr. Talbert Cage for microcytic anemia.  She was treated with IV iron 05/24/2017.  CBC done July 11, 2017 showed a white count 11.8 hemoglobin 10.9 platelets 220,000.   Patient states that she is chronically fatigued and feels like she has no energy.  She has constant ice cravings.  She is followed by cardiology as well as her primary care physician.    Current Status: Patient seen today for follow-up to go over recent labs.  She reports fatigue.  Upon evaluation in the clinic blood pressure was noted to be significantly elevated.  She is also complaining of  constipation which she felt worsened after IV iron.  She also has problems with nausea.  She reports occasional dizzy spells and has had some weight gain.  Problem List Patient Active Problem List   Diagnosis Date Noted  . Iron deficiency anemia [D50.9] 05/16/2017  . Vitamin D deficiency [E55.9] 11/25/2016  . Iron deficiency [E61.1] 11/25/2016  . History of acute myocardial infarction [I25.2] 10/05/2016  . CHF (congestive heart failure) ([place]) [I50.9] 05/06/2016  . Diabetes mellitus with complication ([place]) [E70.3] 05/06/2016  . Leukocytosis [D72.829] 05/06/2016  . Neuropathy [G62.9] 05/06/2016  . Depression [F32.9] 04/16/2016  . Chronic tension-type headache, not intractable [G44.229] 04/16/2016  . AKI (acute kidney injury) ([place]) [N17.9]   . Hyperkalemia [E87.5]   . Nonischemic cardiomyopathy ([place]) [I42.8]   . Acute on chronic combined systolic and diastolic ACC/AHA stage C congestive heart failure ([place]) [I50.43] 04/03/2016  . Acute on chronic combined systolic and diastolic CHF, NYHA class 4 ([place]) [I50.43] 04/03/2016  . Hypertensive emergency [I16.1] 04/03/2016  . Cardiomyopathy due to hypertension ([place]) [I11.9, I43] 12/22/2015  . Normal coronary arteries [Z03.89] 12/22/2015  . Troponin level elevated [R74.8] 12/22/2015  . NSTEMI (non-ST elevated myocardial infarction) ([place]) [I21.4] 12/20/2015  . Dental infection [K04.7] 10/10/2015  . Chest pain [R07.9] 09/05/2015  . Systolic CHF, chronic ([place]) [I50.22] 09/05/2015  . LLQ pain [R10.32]   . Type 2 diabetes mellitus without complication ([place]) [J00.9] 04/28/2014  . Essential hypertension [I10]   . 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  Hypertensive urgency [I16.0] 04/22/2014  . Acute CHF ([place]) [I50.9] 04/22/2014  . S/P cesarean section [Z98.891] 04/11/2014  . Acute pulmonary edema ([place]) [J81.0] 04/11/2014  . Postoperative anemia [D64.9] 04/11/2014  . Elevated serum creatinine [R79.89] 04/11/2014  . Preeclampsia, severe [O14.10]  04/09/2014  . Pre-eclampsia superimposed on chronic hypertension, antepartum [O11.9, O10.019] 04/08/2014  . Dyspnea [R06.00] 04/08/2014  . Polyhydramnios in third trimester, antepartum [O40.3XX0] 03/14/2014  . Abnormal maternal glucose tolerance, antepartum [O99.810] 03/11/2014  . High-risk pregnancy [O09.90] 03/11/2014  . Pre-existing essential hypertension complicating pregnancy [K53.976] 03/11/2014  . Impaired glucose tolerance during pregnancy, antepartum [O99.810] 11/27/2013  . Leiomyoma of uterus [D25.9] 11/22/2013  . History of gestational diabetes in prior pregnancy, currently pregnant in first trimester [O09.291, Z86.32] 11/22/2013  . Hx of preeclampsia, prior pregnancy, currently pregnant [O09.299] 11/22/2013  . Short interval between pregnancies affecting pregnancy, antepartum [O09.899] 11/22/2013  . Supervision of high-risk pregnancy of elderly primigravida (>= 74 years old at delivery), third trimester [O09.513] 11/22/2013  . Miscarriage [O03.9] 03/19/2013  . Major depressive disorder, single episode, unspecified [F32.9] 09/27/2011  . Hypertension [I10]   . Hypertensive cardiovascular disease [I11.9]   . Microcytic anemia [D50.9]   . Osteoarthrosis involving lower leg [M17.10] 03/29/2011  . OSA on CPAP [G47.33, Z99.89] 12/09/2009  . Morbid obesity ([place]) [E66.01] 04/16/2009    Past Medical History Past Medical History:  Diagnosis Date  . Anemia    H&H of 10.6/33 and 07/2008 and 11.9/35 and 09/2010  . Anxiety   . CHF (congestive heart failure) ([place])   . Depression with anxiety   . Diabetes mellitus without complication ([place])   . Enlarged heart   . Fasting hyperglycemia   . Hypertension    Lab: Normal BMet except glucose of 118 in 09/2010  . Hypertensive heart disease 2009   Pulmonary edema postpartum; mild to moderate mitral regurgitation when hospitalized for CHF in 2009; Echocardiogram in 12/2009-no MR and normal EF; normal CXR in 09/2010  . Migraine headache   .  Miscarriage 03/19/2013  . Obesity 04/16/2009  . Osteoarthritis, knee 03/29/2011  . Preeclampsia   . Pregnant   . Pulmonary edema   . Sleep apnea   . Threatened abortion in early pregnancy 03/15/2013    Past Surgical History Past Surgical History:  Procedure Laterality Date  . BREAST REDUCTION SURGERY  2002  . CARDIAC CATHETERIZATION N/A 12/22/2015   Procedure: Left Heart Cath and Coronary Angiography;  Surgeon: Peter M Martinique, MD;  Location: [facility];  Service: Cardiovascular;  Laterality: N/A;  . CESAREAN SECTION N/A 04/09/2014   Procedure: CESAREAN SECTION;  Surgeon: Mora Bellman, MD;  Location: [facility];  Service: Obstetrics;  Laterality: N/A;  . CHOLECYSTECTOMY      Family History Family History  Problem Relation Age of Onset  . Diabetes Mother   . Heart disease Mother   . Hyperlipidemia Paternal Grandfather   . Hypertension Paternal Grandfather   . Heart disease Father   . Hypertension Father   . Heart disease Maternal Grandmother   . ADD / ADHD Son   . Hypertension Maternal Uncle   . Heart attack Brother   . Sudden death Neg Hx      Social History  reports that  has never smoked. she has never used smokeless tobacco. She reports that she drinks alcohol. She reports that she does not use drugs.  Medications  Current Outpatient Medications:  .  aspirin EC 81 MG tablet, Take 81 mg by mouth daily., Disp: ,  Rfl:  .  carvedilol (COREG) 25 MG tablet, Take 1 tablet (25 mg total) by mouth 2 (two) times daily with a meal., Disp: 60 tablet, Rfl: 12 .  gabapentin (NEURONTIN) 300 MG capsule, Take 600 mg by mouth at bedtime. , Disp: , Rfl:  .  hydrALAZINE (APRESOLINE) 100 MG tablet, Take 1 tablet (100 mg total) by mouth 3 (three) times daily., Disp: 270 tablet, Rfl: 3 .  HYDROcodone-acetaminophen (NORCO/VICODIN) 5-325 MG tablet, One tablet every four hours as needed for acute pain.  Limit of five days per [place] statue., Disp: 30 tablet, Rfl: 0 .  insulin glargine  (LANTUS) 100 UNIT/ML injection, Inject 30 Units at bedtime into the skin. , Disp: , Rfl:  .  insulin lispro (HUMALOG) 100 UNIT/ML injection, Inject into the skin 3 (three) times daily before meals., Disp: , Rfl:  .  Lancets (FREESTYLE) lancets, Use as instructed, Disp: 100 each, Rfl: 12 .  Magnesium 400 MG TABS, Take 400 mg by mouth as directed. Take 400 mg twice a day for 4 days, then take 400 mg daily, Disp: 100 tablet, Rfl: 3 .  meloxicam (MOBIC) 15 MG tablet, Take 1 tablet (15 mg total) by mouth daily., Disp: 30 tablet, Rfl: 54 .  metFORMIN (GLUCOPHAGE) 500 MG tablet, Take 500 mg 2 (two) times daily with a meal by mouth., Disp: , Rfl:  .  nabumetone (RELAFEN) 750 MG tablet, Take 1 tablet (750 mg total) by mouth 2 (two) times daily. One by mouth twice a day after eating., Disp: 60 tablet, Rfl: 5 .  nitroGLYCERIN (NITROSTAT) 0.4 MG SL tablet, Place 1 tablet (0.4 mg total) under the tongue every 5 (five) minutes as needed for chest pain., Disp: 30 tablet, Rfl: 12 .  potassium chloride SA (K-DUR,KLOR-CON) 20 MEQ tablet, Take 1 tablet (20 mEq total) by mouth daily., Disp: 30 tablet, Rfl: 0 .  sacubitril-valsartan (ENTRESTO) 97-103 MG, Take 1 tablet by mouth 2 (two) times daily., Disp: 60 tablet, Rfl: 0 .  spironolactone (ALDACTONE) 25 MG tablet, Take 1 tablet (25 mg total) by mouth daily., Disp: 90 tablet, Rfl: 3 .  torsemide (DEMADEX) 20 MG tablet, Please take 40 mg oral every morning and 20 mg every evening. (Patient taking differently: Take 20-40 mg 2 (two) times daily by mouth. Please take 40 mg oral every morning and 20 mg every evening.), Disp: 90 tablet, Rfl: 0  Allergies Diclofenac; Tramadol; and Vicodin [hydrocodone-acetaminophen]  Review of Systems Review of Systems - Oncology ROS as per HPI otherwise 12 point ROS is negative other than constipation, nausea, dizzy spells, weight gain   Physical Exam  Vitals Wt Readings from Last 3 Encounters:  07/28/17 255 lb 3.2 oz (115.8 kg)   07/07/17 244 lb (110.7 kg)  07/06/17 248 lb (112.5 kg)   Temp Readings from Last 3 Encounters:  07/28/17 (!) 97.5 F (36.4 C) (Oral)  07/06/17 98.2 F (36.8 C)  05/24/17 97.6 F (36.4 C) (Oral)   BP Readings from Last 3 Encounters:  07/28/17 (!) 176/102  07/07/17 (!) 152/100  07/06/17 (!) 192/100   Pulse Readings from Last 3 Encounters:  07/28/17 86  07/07/17 77  06/08/17 92   Constitutional: Well-developed, well-nourished, and in no distress.   HENT: Head: Normocephalic and atraumatic.  Mouth/Throat: No oropharyngeal exudate. Mucosa moist. Eyes: Pupils are equal, round, and reactive to light. Conjunctivae are normal. No scleral icterus.  Neck: Normal range of motion. Neck supple. No JVD present.  Cardiovascular: Normal rate, regular rhythm and  normal heart sounds.  Exam reveals no gallop and no friction rub.   No murmur heard. Pulmonary/Chest: Effort normal and breath sounds normal. No respiratory distress. No wheezes.No rales.  Abdominal: Soft. Bowel sounds are normal. No distension. There is no tenderness. There is no guarding.  Musculoskeletal: No edema or tenderness.  Lymphadenopathy: No cervical, axillary or supraclavicular adenopathy.  Neurological: Alert and oriented to person, place, and time. No cranial nerve deficit.  Skin: Skin is warm and dry. No rash noted. No erythema. No pallor.  Psychiatric: Affect and judgment normal.   Labs No visits with results within 3 Day(s) from this visit.  Latest known visit with results is:  Hospital Outpatient Visit on 07/11/2017  Component Date Value Ref Range Status  . LV PW d 07/11/2017 14* 0.6 - 1.1 mm Final  . FS 07/11/2017 11* 28 - 44 % Final  . LA vol 07/11/2017 93.5  mL Final  . LA ID, A-P, ES 07/11/2017 48  mm Final  . IVS/LV PW RATIO, ED 07/11/2017 .97   Final  . Stroke v 07/11/2017 54  ml Final  . LVOT VTI 07/11/2017 20.4  cm Final  . LV e' LATERAL 07/11/2017 5.98  cm/s Final  . LV E/e' medial 07/11/2017 18.9    Final  . LV E/e'average 07/11/2017 18.9   Final  . LA diam index 07/11/2017 2.07  cm/m2 Final  . LA vol A4C 07/11/2017 89.4  ml Final  . LVOT peak grad rest 07/11/2017 5  mmHg Final  . E decel time 07/11/2017 197  msec Final  . LVOT diameter 07/11/2017 19  mm Final  . LVOT area 07/11/2017 2.84  cm2 Final  . LVOT peak vel 07/11/2017 116  cm/s Final  . LVOT SV 07/11/2017 58.00  mL Final  . Peak grad 07/11/2017 5  mmHg Final  . E/e' ratio 07/11/2017 18.90   Final  . MV pk E vel 07/11/2017 113  m/s Final  . MV pk A vel 07/11/2017 84.8  m/s Final  . LV sys vol 07/11/2017 83* 14 - 42 mL Final  . LV sys vol index 07/11/2017 36.0  mL/m2 Final  . LV dias vol 07/11/2017 137* 46 - 106 mL Final  . LV dias vol index 07/11/2017 59.0  mL/m2 Final  . LA vol index 07/11/2017 40.3  mL/m2 Final  . MV Dec 07/11/2017 197   Final  . LA diam end sys 07/11/2017 48.00  mm Final  . Simpson's disk 07/11/2017 39.00   Final  . TDI e' medial 07/11/2017 5.66   Final  . TDI e' lateral 07/11/2017 5.98   Final  . Lateral S' vel 07/11/2017 10.20  cm/sec Final  . TAPSE 07/11/2017 23.40  mm Final     Pathology Orders Placed This Encounter  Procedures  . CBC with Differential/Platelet    Standing Status:   Future    Standing Expiration Date:   07/29/2018  . Comprehensive metabolic panel    Standing Status:   Future    Standing Expiration Date:   07/29/2018  . Ferritin    Standing Status:   Future    Standing Expiration Date:   07/29/2018  . Lactate dehydrogenase    Standing Status:   Future    Standing Expiration Date:   07/29/2018       Zoila Shutter MD

## 2017-07-28 NOTE — Patient Instructions (Signed)
[place] at [facility] Discharge Instructions   You were seen today by Dr. Zoila Shutter She went over your labs and everything looked good. Due to your elevated Blood Pressure we recommend that you go to the ER for evaluation. She wants to refer you to a GI doctor as well. Please follow up with [facility] to evaluate your heavy periods. We will see you back in 4 months for follow up and labs   Thank you for choosing [place] at [facility] to provide your oncology and hematology care.  To afford each patient quality time with our provider, please arrive at least 15 minutes before your scheduled appointment time.    If you have a lab appointment with the [facility] please come in thru the  Main Entrance and check in at the main information desk  You need to re-schedule your appointment should you arrive 10 or more minutes late.  We strive to give you quality time with our providers, and arriving late affects you and other patients whose appointments are after yours.  Also, if you no show three or more times for appointments you may be dismissed from the clinic at the providers discretion.     Again, thank you for choosing [facility]  Our hope is that these requests will decrease the amount of time that you wait before being seen by our physicians.       _____________________________________________________________  Should you have questions after your visit to [facility], please contact our office at [phone] between the hours of 8:30 a.m. and 4:30 p.m.  Voicemails left after 4:30 p.m. will not be returned until the following business day.  For prescription refill requests, have your pharmacy contact our office.       Resources For Cancer Patients and their Caregivers ? American Cancer Society: Can assist with transportation, wigs, general needs, runs Look Good Feel Better.         [phone] ? Cancer Care: Provides financial assistance, online support groups, medication/co-pay assistance.  1-800-813-HOPE 9016716160) ? [facility] Assists [place] cancer patients and their families through emotional , educational and financial support.  564-714-5204 ? [place] DSS Where to apply for food stamps, Medicaid and utility assistance. (781)170-3966 ? [facility]: Transportation to medical appointments. 323-268-9335 ? Social Security Administration: May apply for disability if have a Stage IV cancer. 4381679642 (405)584-5517 ? [employer], Disability and Transit Services: Assists with nutrition, care and transit needs. [place] Support Programs:   > Cancer Support Group  2nd Tuesday of the month 1pm-2pm, Journey Room   > Creative Journey  3rd Tuesday of the month 1130am-1pm, Journey Room

## 2017-07-29 ENCOUNTER — Ambulatory Visit (HOSPITAL_COMMUNITY): Payer: Medicaid Other | Admitting: Internal Medicine

## 2017-08-03 ENCOUNTER — Encounter: Payer: Self-pay | Admitting: Gastroenterology

## 2017-08-09 ENCOUNTER — Encounter: Payer: Self-pay | Admitting: Cardiology

## 2017-08-22 ENCOUNTER — Other Ambulatory Visit: Payer: Self-pay | Admitting: Orthopaedic Surgery

## 2017-08-22 MED ORDER — HYDROCODONE-ACETAMINOPHEN 5-325 MG PO TABS: ORAL_TABLET | ORAL | 0 refills | Status: DC

## 2017-08-22 NOTE — Telephone Encounter (Signed)
Patient of Dr. Brooke Bonito requests refill on Hydrocodone/Acetaminophen 7.5-325  Mgs   Qty  30        Sig: One tablet every four hours as needed for acute pain. Limit of five days per [place] statue.    Patient states she uses [facility]

## 2017-09-28 ENCOUNTER — Observation Stay (HOSPITAL_COMMUNITY)
Admission: EM | Admit: 2017-09-28 | Discharge: 2017-09-30 | Disposition: A | Discharge status: Home / Self Care | Payer: Medicaid Other | Attending: Internal Medicine | Admitting: Internal Medicine

## 2017-09-28 ENCOUNTER — Other Ambulatory Visit: Payer: Self-pay

## 2017-09-28 DIAGNOSIS — Z7982 Long term (current) use of aspirin: Secondary | ICD-10-CM | POA: Insufficient documentation

## 2017-09-28 DIAGNOSIS — Z6841 Body Mass Index (BMI) 40.0 and over, adult: Secondary | ICD-10-CM | POA: Diagnosis not present

## 2017-09-28 DIAGNOSIS — R519 Headache, unspecified: Secondary | ICD-10-CM

## 2017-09-28 DIAGNOSIS — I252 Old myocardial infarction: Secondary | ICD-10-CM | POA: Diagnosis not present

## 2017-09-28 DIAGNOSIS — Z8249 Family history of ischemic heart disease and other diseases of the circulatory system: Secondary | ICD-10-CM | POA: Insufficient documentation

## 2017-09-28 DIAGNOSIS — G43909 Migraine, unspecified, not intractable, without status migrainosus: Secondary | ICD-10-CM | POA: Diagnosis not present

## 2017-09-28 DIAGNOSIS — M1990 Unspecified osteoarthritis, unspecified site: Secondary | ICD-10-CM | POA: Insufficient documentation

## 2017-09-28 DIAGNOSIS — R51 Headache: Secondary | ICD-10-CM

## 2017-09-28 DIAGNOSIS — Z885 Allergy status to narcotic agent status: Secondary | ICD-10-CM | POA: Diagnosis not present

## 2017-09-28 DIAGNOSIS — F418 Other specified anxiety disorders: Secondary | ICD-10-CM | POA: Diagnosis not present

## 2017-09-28 DIAGNOSIS — I509 Heart failure, unspecified: Secondary | ICD-10-CM

## 2017-09-28 DIAGNOSIS — G4733 Obstructive sleep apnea (adult) (pediatric): Secondary | ICD-10-CM | POA: Diagnosis not present

## 2017-09-28 DIAGNOSIS — Z794 Long term (current) use of insulin: Secondary | ICD-10-CM | POA: Diagnosis not present

## 2017-09-28 DIAGNOSIS — E876 Hypokalemia: Secondary | ICD-10-CM | POA: Insufficient documentation

## 2017-09-28 DIAGNOSIS — D509 Iron deficiency anemia, unspecified: Secondary | ICD-10-CM | POA: Diagnosis not present

## 2017-09-28 DIAGNOSIS — I5043 Acute on chronic combined systolic (congestive) and diastolic (congestive) heart failure: Secondary | ICD-10-CM | POA: Diagnosis present

## 2017-09-28 DIAGNOSIS — Z9989 Dependence on other enabling machines and devices: Secondary | ICD-10-CM

## 2017-09-28 DIAGNOSIS — I119 Hypertensive heart disease without heart failure: Secondary | ICD-10-CM | POA: Diagnosis present

## 2017-09-28 DIAGNOSIS — I160 Hypertensive urgency: Secondary | ICD-10-CM | POA: Diagnosis present

## 2017-09-28 DIAGNOSIS — I110 Hypertensive heart disease with heart failure: Secondary | ICD-10-CM | POA: Diagnosis not present

## 2017-09-28 DIAGNOSIS — E119 Type 2 diabetes mellitus without complications: Secondary | ICD-10-CM | POA: Insufficient documentation

## 2017-09-29 ENCOUNTER — Emergency Department (HOSPITAL_COMMUNITY): Payer: Medicaid Other

## 2017-09-29 ENCOUNTER — Encounter (HOSPITAL_COMMUNITY): Payer: Self-pay | Admitting: *Deleted

## 2017-09-29 ENCOUNTER — Other Ambulatory Visit: Payer: Self-pay

## 2017-09-29 DIAGNOSIS — I252 Old myocardial infarction: Secondary | ICD-10-CM

## 2017-09-29 DIAGNOSIS — Z79899 Other long term (current) drug therapy: Secondary | ICD-10-CM

## 2017-09-29 DIAGNOSIS — I5021 Acute systolic (congestive) heart failure: Secondary | ICD-10-CM

## 2017-09-29 DIAGNOSIS — Z888 Allergy status to other drugs, medicaments and biological substances status: Secondary | ICD-10-CM

## 2017-09-29 DIAGNOSIS — I4581 Long QT syndrome: Secondary | ICD-10-CM

## 2017-09-29 DIAGNOSIS — I110 Hypertensive heart disease with heart failure: Secondary | ICD-10-CM

## 2017-09-29 DIAGNOSIS — I161 Hypertensive emergency: Secondary | ICD-10-CM | POA: Diagnosis not present

## 2017-09-29 DIAGNOSIS — Z885 Allergy status to narcotic agent status: Secondary | ICD-10-CM

## 2017-09-29 DIAGNOSIS — Z794 Long term (current) use of insulin: Secondary | ICD-10-CM | POA: Diagnosis not present

## 2017-09-29 DIAGNOSIS — E119 Type 2 diabetes mellitus without complications: Secondary | ICD-10-CM | POA: Diagnosis not present

## 2017-09-29 DIAGNOSIS — Z8249 Family history of ischemic heart disease and other diseases of the circulatory system: Secondary | ICD-10-CM

## 2017-09-29 DIAGNOSIS — M1990 Unspecified osteoarthritis, unspecified site: Secondary | ICD-10-CM

## 2017-09-29 DIAGNOSIS — D509 Iron deficiency anemia, unspecified: Secondary | ICD-10-CM | POA: Diagnosis not present

## 2017-09-29 DIAGNOSIS — I509 Heart failure, unspecified: Secondary | ICD-10-CM

## 2017-09-29 DIAGNOSIS — E876 Hypokalemia: Secondary | ICD-10-CM | POA: Diagnosis not present

## 2017-09-29 DIAGNOSIS — G4733 Obstructive sleep apnea (adult) (pediatric): Secondary | ICD-10-CM

## 2017-09-29 DIAGNOSIS — Z833 Family history of diabetes mellitus: Secondary | ICD-10-CM

## 2017-09-29 LAB — CBC
HEMATOCRIT: 33.3 % — AB (ref 36.0–46.0)
Hemoglobin: 10.3 g/dL — ABNORMAL LOW (ref 12.0–15.0)
MCH: 24.1 pg — ABNORMAL LOW (ref 26.0–34.0)
MCHC: 30.9 g/dL (ref 30.0–36.0)
MCV: 77.8 fL — ABNORMAL LOW (ref 78.0–100.0)
Platelets: 270 10*3/uL (ref 150–400)
RBC: 4.28 MIL/uL (ref 3.87–5.11)
RDW: 14.1 % (ref 11.5–15.5)
WBC: 11.6 10*3/uL — AB (ref 4.0–10.5)

## 2017-09-29 LAB — BASIC METABOLIC PANEL
Anion gap: 12 (ref 5–15)
BUN: 17 mg/dL (ref 6–20)
CO2: 26 mmol/L (ref 22–32)
Calcium: 8.6 mg/dL — ABNORMAL LOW (ref 8.9–10.3)
Chloride: 99 mmol/L — ABNORMAL LOW (ref 101–111)
Creatinine, Ser: 1.02 mg/dL — ABNORMAL HIGH (ref 0.44–1.00)
GFR calc Af Amer: >60 mL/min (ref >60)
Glucose, Bld: 262 mg/dL — ABNORMAL HIGH (ref 65–99)
POTASSIUM: 3.2 mmol/L — AB (ref 3.5–5.1)
Sodium: 137 mmol/L (ref 135–145)

## 2017-09-29 LAB — GLUCOSE, CAPILLARY: Glucose-Capillary: 224 mg/dL — ABNORMAL HIGH (ref 65–99)

## 2017-09-29 LAB — TROPONIN I: Troponin I: <0.03 ng/mL (ref <0.03)

## 2017-09-29 LAB — I-STAT BETA HCG BLOOD, ED (MC, WL, AP ONLY): I-stat hCG, quantitative: <5 m[IU]/mL (ref <5)

## 2017-09-29 LAB — BRAIN NATRIURETIC PEPTIDE: B Natriuretic Peptide: 683.5 pg/mL — ABNORMAL HIGH (ref 0.0–100.0)

## 2017-09-29 MED ORDER — SODIUM CHLORIDE 0.9% FLUSH
3.0000 mL | Freq: Two times a day (BID) | INTRAVENOUS | Status: DC
  Administered 2017-09-29 – 2017-09-30 (×2): 3 mL via INTRAVENOUS

## 2017-09-29 MED ORDER — INSULIN ASPART 100 UNIT/ML ~~LOC~~ SOLN
0.0000 [IU] | Freq: Three times a day (TID) | SUBCUTANEOUS | Status: DC
Start: 2017-09-30 — End: 2017-09-30
  Administered 2017-09-30 (×2): 5 [IU] via SUBCUTANEOUS

## 2017-09-29 MED ORDER — LABETALOL HCL 5 MG/ML IV SOLN
10.0000 mg | Freq: Once | INTRAVENOUS | Status: AC
  Administered 2017-09-29: 10 mg via INTRAVENOUS
  Filled 2017-09-29: qty 4

## 2017-09-29 MED ORDER — LORAZEPAM 2 MG/ML IJ SOLN
0.5000 mg | Freq: Once | INTRAMUSCULAR | Status: AC
  Administered 2017-09-29: 0.5 mg via INTRAVENOUS
  Filled 2017-09-29: qty 1

## 2017-09-29 MED ORDER — FUROSEMIDE 10 MG/ML IJ SOLN
80.0000 mg | Freq: Once | INTRAMUSCULAR | Status: AC
  Administered 2017-09-29: 80 mg via INTRAVENOUS
  Filled 2017-09-29: qty 8

## 2017-09-29 MED ORDER — METFORMIN HCL 500 MG PO TABS
500.0000 mg | ORAL_TABLET | Freq: Two times a day (BID) | ORAL | Status: DC
  Administered 2017-09-29: 500 mg via ORAL
  Filled 2017-09-29: qty 1

## 2017-09-29 MED ORDER — INSULIN ASPART 100 UNIT/ML ~~LOC~~ SOLN
0.0000 [IU] | Freq: Every day | SUBCUTANEOUS | Status: DC
  Administered 2017-09-29: 2 [IU] via SUBCUTANEOUS

## 2017-09-29 MED ORDER — CARVEDILOL 25 MG PO TABS
25.0000 mg | ORAL_TABLET | Freq: Two times a day (BID) | ORAL | Status: DC
  Administered 2017-09-29 – 2017-09-30 (×3): 25 mg via ORAL
  Filled 2017-09-29: qty 1
  Filled 2017-09-29: qty 2
  Filled 2017-09-29: qty 1

## 2017-09-29 MED ORDER — CLONIDINE HCL 0.2 MG PO TABS
0.2000 mg | ORAL_TABLET | Freq: Once | ORAL | Status: AC
  Administered 2017-09-29: 0.2 mg via ORAL
  Filled 2017-09-29: qty 1

## 2017-09-29 MED ORDER — ASPIRIN EC 81 MG PO TBEC
81.0000 mg | DELAYED_RELEASE_TABLET | Freq: Every day | ORAL | Status: DC
  Administered 2017-09-29 – 2017-09-30 (×2): 81 mg via ORAL
  Filled 2017-09-29 (×2): qty 1

## 2017-09-29 MED ORDER — POLYETHYLENE GLYCOL 3350 17 G PO PACK: 17.0000 g | PACK | Freq: Every day | ORAL | Status: DC | PRN

## 2017-09-29 MED ORDER — ENOXAPARIN SODIUM 40 MG/0.4ML ~~LOC~~ SOLN
40.0000 mg | SUBCUTANEOUS | Status: DC
  Administered 2017-09-29: 40 mg via SUBCUTANEOUS
  Filled 2017-09-29: qty 0.4

## 2017-09-29 MED ORDER — PANTOPRAZOLE SODIUM 40 MG PO TBEC
40.0000 mg | DELAYED_RELEASE_TABLET | Freq: Every day | ORAL | Status: DC
  Administered 2017-09-30: 40 mg via ORAL
  Filled 2017-09-29: qty 1

## 2017-09-29 MED ORDER — ACETAMINOPHEN 650 MG RE SUPP: 650.0000 mg | Freq: Four times a day (QID) | RECTAL | Status: DC | PRN

## 2017-09-29 MED ORDER — SACUBITRIL-VALSARTAN 97-103 MG PO TABS
1.0000 | ORAL_TABLET | Freq: Two times a day (BID) | ORAL | Status: DC
  Administered 2017-09-29 – 2017-09-30 (×2): 1 via ORAL
  Filled 2017-09-29 (×2): qty 1

## 2017-09-29 MED ORDER — SPIRONOLACTONE 25 MG PO TABS
25.0000 mg | ORAL_TABLET | Freq: Every day | ORAL | Status: DC
  Administered 2017-09-29: 25 mg via ORAL
  Filled 2017-09-29: qty 1

## 2017-09-29 MED ORDER — KETOROLAC TROMETHAMINE 15 MG/ML IJ SOLN
15.0000 mg | Freq: Once | INTRAMUSCULAR | Status: AC
  Administered 2017-09-29: 15 mg via INTRAVENOUS
  Filled 2017-09-29: qty 1

## 2017-09-29 MED ORDER — CITALOPRAM HYDROBROMIDE 20 MG PO TABS
20.0000 mg | ORAL_TABLET | Freq: Every day | ORAL | Status: DC
  Administered 2017-09-30: 20 mg via ORAL
  Filled 2017-09-29: qty 1

## 2017-09-29 MED ORDER — NITROGLYCERIN 0.4 MG SL SUBL: 0.4000 mg | SUBLINGUAL_TABLET | SUBLINGUAL | Status: DC | PRN

## 2017-09-29 MED ORDER — INSULIN GLARGINE 100 UNIT/ML ~~LOC~~ SOLN
30.0000 [IU] | Freq: Every day | SUBCUTANEOUS | Status: DC
  Administered 2017-09-29: 30 [IU] via SUBCUTANEOUS
  Filled 2017-09-29 (×2): qty 0.3

## 2017-09-29 MED ORDER — MORPHINE SULFATE (PF) 4 MG/ML IV SOLN
4.0000 mg | Freq: Once | INTRAVENOUS | Status: AC
  Administered 2017-09-29: 4 mg via INTRAVENOUS
  Filled 2017-09-29: qty 1

## 2017-09-29 MED ORDER — POTASSIUM CHLORIDE CRYS ER 20 MEQ PO TBCR
40.0000 meq | EXTENDED_RELEASE_TABLET | Freq: Once | ORAL | Status: AC
  Administered 2017-09-29: 40 meq via ORAL
  Filled 2017-09-29: qty 2

## 2017-09-29 MED ORDER — KETOROLAC TROMETHAMINE 30 MG/ML IJ SOLN
30.0000 mg | Freq: Once | INTRAMUSCULAR | Status: AC
  Administered 2017-09-29: 30 mg via INTRAVENOUS
  Filled 2017-09-29: qty 1

## 2017-09-29 MED ORDER — GABAPENTIN 600 MG PO TABS
600.0000 mg | ORAL_TABLET | Freq: Three times a day (TID) | ORAL | Status: DC
  Administered 2017-09-29 – 2017-09-30 (×2): 600 mg via ORAL
  Filled 2017-09-29 (×2): qty 1

## 2017-09-29 MED ORDER — HYDRALAZINE HCL 25 MG PO TABS
100.0000 mg | ORAL_TABLET | Freq: Three times a day (TID) | ORAL | Status: DC
  Administered 2017-09-29 – 2017-09-30 (×4): 100 mg via ORAL
  Filled 2017-09-29 (×3): qty 4

## 2017-09-29 MED ORDER — ACETAMINOPHEN 325 MG PO TABS
650.0000 mg | ORAL_TABLET | Freq: Four times a day (QID) | ORAL | Status: DC | PRN
  Administered 2017-09-30: 650 mg via ORAL
  Filled 2017-09-29 (×2): qty 2

## 2017-09-29 MED ORDER — FUROSEMIDE 10 MG/ML IJ SOLN
80.0000 mg | Freq: Two times a day (BID) | INTRAMUSCULAR | Status: DC
  Administered 2017-09-29 – 2017-09-30 (×2): 80 mg via INTRAVENOUS
  Filled 2017-09-29 (×2): qty 8

## 2017-09-29 NOTE — ED Notes (Signed)
Pt given bag lunch. Cafeteria not in service at this time.

## 2017-09-29 NOTE — Progress Notes (Signed)
Pt educated about safety and importance of bed alarm during the night however pt refuses to be on bed alarm. Will continue to round on patient.   Zalia Hautala, RN    

## 2017-09-29 NOTE — ED Triage Notes (Signed)
The pt is c/o a headache and  Swelling in her feet and ankles for one week  C/o some sob none now  lmp  May 3rd

## 2017-09-29 NOTE — H&P (Signed)
Date: 09/29/2017               Patient Name:  Chelsey Santiago MRN: [id number]  DOB: 03/01/1976 Age / Sex: 42 y.o., female   PCP: Rosita Fire, MD         Medical Service: Internal Medicine Teaching Service         Attending Physician: Dr. Lenice Pressman, MD    First Contact: Jefferey Pica Pager: [phone]  Second Contact: Dr. Lorella Nimrod MD Pager: [phone]       After Hours (After 5p/  First Contact Pager: [phone]  weekends / holidays): Second Contact Pager: [phone]   Chief Complaint: headache, shortness of breath  History of Present Illness:  [name] is a 69 yoF with a PMH of IDA, sCHF, DM, NSTEMI, HTN, OSA and OA who presented to the ED with SOB, headache and ankle swelling over the last few days.  She was somewhat frustrated at having to wait so long to be admitted but she was able to review some of her symptoms with the admitting team. She states that she has felt as though her fluid was up for the last three days, despite taking her torsemide as prescribed. She also stated that she tried taking one extra (total of 80mg  dose) without relief. She endorses chest tightness that she rates at a 7/10. This was accompanied by nausea, vomiting, diaphoresis but no palpitations. She states that she did have floaters & vision changes when she arrived to the ED but these have resolved since receiving IV lasix. She reports being diagnosed by neurology with orthostatic hypotension.  She does not weigh herself daily, but knows when she is is gathering fluid by looking at her face, abdomen and ankles. She believes her dry weight to be approximately 243 lbs. She usually sleeps on 2 pillows at night and has not had to increase this recently. She has been waking up gasping for breath and has been diagnosed with sleep apnea. She has an upcoming appointment to receive her machine.  She uses an inhaler when she gets short of breath but has never been diagnosed with asthma or COPD.  ED  course.  In ED she was found to be hypertensive at 220/126 on presentation, BNP at 638, mild hypokalemia at 3.2, negative troponin x2 and EKG without any acute change.  Meds:  Current Meds  Medication Sig  . albuterol (PROVENTIL HFA;VENTOLIN HFA) 108 (90 Base) MCG/ACT inhaler Inhale 1 puff into the lungs every 6 (six) hours as needed for wheezing or shortness of breath.  . carvedilol (COREG) 25 MG tablet Take 1 tablet (25 mg total) by mouth 2 (two) times daily with a meal.  . citalopram (CELEXA) 20 MG tablet Take 20 mg by mouth daily.  Marland Kitchen gabapentin (NEURONTIN) 600 MG tablet Take 600 mg by mouth 3 (three) times daily.   . hydrALAZINE (APRESOLINE) 50 MG tablet Take 75 mg by mouth 3 (three) times daily.  Marland Kitchen HYDROcodone-acetaminophen (NORCO/VICODIN) 5-325 MG tablet One tablet every four hours as needed for acute pain.  Limit of five days per [place] statue.  . naproxen (NAPROSYN) 500 MG tablet Take 500 mg by mouth 2 (two) times daily with a meal.  . omeprazole (PRILOSEC) 40 MG capsule Take 40 mg by mouth daily.  . ondansetron (ZOFRAN) 4 MG tablet Take 4 mg by mouth daily as needed for nausea or vomiting.  Marland Kitchen oxyCODONE-acetaminophen (PERCOCET/ROXICET) 5-325 MG tablet Take 1 tablet by mouth every 4 (  four) hours as needed for severe pain.  . potassium chloride SA (K-DUR,KLOR-CON) 20 MEQ tablet Take 1 tablet (20 mEq total) by mouth daily.  Marland Kitchen pyridostigmine (MESTINON) 60 MG tablet Take 60 mg by mouth every 8 (eight) hours.  Marland Kitchen spironolactone (ALDACTONE) 25 MG tablet Take 1 tablet (25 mg total) by mouth daily.  Marland Kitchen torsemide (DEMADEX) 20 MG tablet Please take 40 mg oral every morning and 20 mg every evening. (Patient taking differently: Take 20-40 mg 2 (two) times daily by mouth. Please take 40 mg oral every morning and 20 mg every evening.)  . traZODone (DESYREL) 100 MG tablet Take 100 mg by mouth 3 (three) times daily after meals.     Allergies: Allergies as of 09/28/2017 - Review Complete 07/28/2017    Allergen Reaction Noted  . Diclofenac Swelling 07/12/2011  . Tramadol Nausea And Vomiting 05/29/2011  . Vicodin [hydrocodone-acetaminophen] Itching and Nausea Only 05/29/2011   Past Medical History:  Diagnosis Date  . Anemia    H&H of 10.6/33 and 07/2008 and 11.9/35 and 09/2010  . Anxiety   . CHF (congestive heart failure) ([place])   . Depression with anxiety   . Diabetes mellitus without complication ([place])   . Enlarged heart   . Fasting hyperglycemia   . Hypertension    Lab: Normal BMet except glucose of 118 in 09/2010  . Hypertensive heart disease 2009   Pulmonary edema postpartum; mild to moderate mitral regurgitation when hospitalized for CHF in 2009; Echocardiogram in 12/2009-no MR and normal EF; normal CXR in 09/2010  . Migraine headache   . Miscarriage 03/19/2013  . Obesity 04/16/2009  . Osteoarthritis, knee 03/29/2011  . Preeclampsia   . Pregnant   . Pulmonary edema   . Sleep apnea   . Threatened abortion in early pregnancy 03/15/2013    Family History:  Family History  Problem Relation Age of Onset  . Diabetes Mother   . Heart disease Mother   . Hyperlipidemia Paternal Grandfather   . Hypertension Paternal Grandfather   . Heart disease Father   . Hypertension Father   . Heart disease Maternal Grandmother   . ADD / ADHD Son   . Hypertension Maternal Uncle   . Heart attack Brother   . Sudden death Neg Hx     Social History:  Social History   Tobacco Use  . Smoking status: Never Smoker  . Smokeless tobacco: Never Used  Substance Use Topics  . Alcohol use: Yes    Comment: occ  . Drug use: No   Social History   Social History Narrative   Lives in [place]   Engaged/boyfriend (father to youngest chid)   4 children: daughter (7 as of 2013), sons (81, 72, 54 as of 2013)   Religion: christian   Review of Systems: A complete ROS was negative except as per HPI.  Physical Exam: Blood pressure (!) 142/81, pulse 74, temperature 98.1 F (36.7 C), temperature source  Oral, resp. rate 16, height 5\' 6"  (1.676 m), weight 252 lb (114.3 kg), last menstrual period 09/16/2017, SpO2 100 %. Physical Exam  Constitutional: She appears well-developed and well-nourished.  HENT:  Head: Normocephalic and atraumatic.  Cardiovascular: Normal rate and normal heart sounds.  Pulmonary/Chest: Effort normal and breath sounds normal. No accessory muscle usage. No respiratory distress. She has no wheezes.  Abdominal: Soft. Bowel sounds are normal. She exhibits distension.  Musculoskeletal: She exhibits edema (trace to bilateral ankles). She exhibits no deformity.  Skin: Skin is warm and dry.  Psychiatric: She has a normal mood and affect. Her behavior is normal. Thought content normal.    CBC    Component Value Date/Time   WBC 11.6 (H) 09/29/2017 0016   RBC 4.28 09/29/2017 0016   HGB 10.3 (L) 09/29/2017 0016   HCT 33.3 (L) 09/29/2017 0016   PLT 270 09/29/2017 0016   MCV 77.8 (L) 09/29/2017 0016   MCH 24.1 (L) 09/29/2017 0016   MCHC 30.9 09/29/2017 0016   RDW 14.1 09/29/2017 0016   LYMPHSABS 3.1 07/11/2017 1223   MONOABS 0.7 07/11/2017 1223   EOSABS 0.1 07/11/2017 1223   BASOSABS 0.0 07/11/2017 1223   BNP (last 3 results) Recent Labs    03/25/17 0605 09/29/17 0016  BNP 784.7* 683.5*   Lab Results  Component Value Date   WBC 11.6 (H) 09/29/2017   HGB 10.3 (L) 09/29/2017   HCT 33.3 (L) 09/29/2017   PLT 270 09/29/2017   GLUCOSE 262 (H) 09/29/2017   CHOL 154 11/20/2004   TRIG 225 11/20/2004   HDL 38 11/20/2004   LDLCALC 71 11/20/2004   ALT 11 (L) 07/11/2017   AST 11 (L) 07/11/2017   NA 137 09/29/2017   K 3.2 (L) 09/29/2017   CL 99 (L) 09/29/2017   CREATININE 1.02 (H) 09/29/2017   BUN 17 09/29/2017   CO2 26 09/29/2017   TSH 0.534 03/25/2017   INR 1.09 04/03/2016   HGBA1C 10.0 (H) 03/25/2017     EKG: personally reviewed my interpretation is unchanged from prior, though ED reported new T wave inversion.  CXR: personally reviewed my interpretation  is within normal limits.  CT Head performed in the ED WNL.  Assessment & Plan by Problem: Principal Problem:   CHF exacerbation ([place]) Active Problems:   OSA on CPAP   Hypertensive cardiovascular disease  #CHF Exacerbation 2/2 Hypertensive Emergency: [name] present to the emergency room with signs and symptoms concerning for hypertensive emergency and CHF exacerbation. Highest recorded BP was 220/126 accompanied by a headache. Her shortness of breath is likely secondary to her fluid overload, which may have been brought on by this hypertensive episode. She does use an inhaler for symptoms but denies having COPD or asthma. CXR negative for infectious or chronic process and lungs clear on exam. Since admission, she has been able to urinate thrice but this has not been measured. - Strict I&O, daily weights - Continue aggressive diuresis with IV lasix; repeat lasix 80mg  IV - s/p 10mg  IV labetalol - CT Head negative - QTc of 500; avoid prolonging agents - Troponins neg - Continue home entresto, aldactone, hydralazine - Nitrogly prn  #Hypokalemia: 3.2 on admission. Prolonged QT, ED reporting inverted T waves. - Replete as indicated -Repeat BMP in the morning.  #IDA: Hgb 10.3, stable. CTM.  #DM: Continue Lantus 30 units at bedtime with SSI.  Continue gabapentin 300mg  TID.  #OSA: Encourage home CPAP use. Patient declined during inpatient admission.  #PPX: Lovenox #GI: PEG prn  Dispo: Admit patient to Observation with expected length of stay less than 2 midnights.  Signed: Laureen Ochs, Medical Student 09/29/2017, 5:16 PM  Pager: [phone]  Attestation for Student Documentation:  I personally was present and performed or re-performed the history, physical exam and medical decision-making activities of this service and have verified that the service and findings are accurately documented in the student's note.  Lorella Nimrod, MD 09/29/2017, 6:04 PM

## 2017-09-29 NOTE — Progress Notes (Signed)
Patient noncompliant with food and drinks. Educated patient about importance of restrictring fluid since patient is diuresing and on Lasix.  Will continue to monitor.  Deni Berti, RN

## 2017-09-29 NOTE — ED Notes (Signed)
The pt has waited at [facility] hosp in [place] for 4 hours then came here after she was not seen in that length of time

## 2017-09-29 NOTE — Progress Notes (Signed)
Patient complaining of headache, refusing Tylenol paged IM resident on call Johny Chess and requested something else. Awaiting on call.  Morayo Leven, RN

## 2017-09-29 NOTE — ED Provider Notes (Signed)
[facility] Provider Note   CSN: [id number] Arrival date & time: 09/28/17  2335     History   Chief Complaint Chief Complaint  Patient presents with  . Headache  . Shortness of Breath    HPI Chelsey Santiago is a 42 y.o. female with history of HTN, hypertensive heart disease, pulmonary edema, acute on chronic systolic and diastolic CHF, Class IV NYHA Class 4, DM type II, anemia who presents to the emergency department with a chief complaint of headache.   The patient endorses an intermittent global headache with associated nausea, photophobia and photophobia that began 3 days ago but has since become constant. She also is been seeing "starlights" in her vision.    She also endorses bilateral lower extremity swelling that has been gradually worsening over the last week.  She developed dyspnea 2 days ago.  She weighs herself daily and her dry weight is 240 pounds.  Yesterday, she was 252 pounds.  She has been treating her symptoms with her home furosemide, and has had worsening leg swelling despite having taken several extra doses over the last few days.  She called her cardiologist to try and schedule an appointment, but was unable to get an appointment.  She denies diplopia, syncope, lightheadedness, abdominal pain, vomiting, diarrhea, or back pain.  She was last seen by neurology on April 10 and was diagnosed with orthostatic hypotension.   Cardiologist: Velora Heckler Neurologist: [facility]   The history is provided by the patient. No language interpreter was used.  Headache   Associated symptoms include shortness of breath and nausea.  Shortness of Breath  Associated symptoms include headaches and leg swelling. Pertinent negatives include no chest pain, no abdominal pain and no rash.    Past Medical History:  Diagnosis Date  . Anemia    H&H of 10.6/33 and 07/2008 and 11.9/35 and 09/2010  . Anxiety   . CHF (congestive heart failure)  ([place])   . Depression with anxiety   . Diabetes mellitus without complication ([place])   . Enlarged heart   . Fasting hyperglycemia   . Hypertension    Lab: Normal BMet except glucose of 118 in 09/2010  . Hypertensive heart disease 2009   Pulmonary edema postpartum; mild to moderate mitral regurgitation when hospitalized for CHF in 2009; Echocardiogram in 12/2009-no MR and normal EF; normal CXR in 09/2010  . Migraine headache   . Miscarriage 03/19/2013  . Obesity 04/16/2009  . Osteoarthritis, knee 03/29/2011  . Preeclampsia   . Pregnant   . Pulmonary edema   . Sleep apnea   . Threatened abortion in early pregnancy 03/15/2013    Patient Active Problem List   Diagnosis Date Noted  . Iron deficiency anemia 05/16/2017  . Vitamin D deficiency 11/25/2016  . Iron deficiency 11/25/2016  . History of acute myocardial infarction 10/05/2016  . CHF (congestive heart failure) ([place]) 05/06/2016  . Diabetes mellitus with complication ([place]) 67/61/9509  . Leukocytosis 05/06/2016  . Neuropathy 05/06/2016  . Depression 04/16/2016  . Chronic tension-type headache, not intractable 04/16/2016  . AKI (acute kidney injury) ([place])   . Hyperkalemia   . Nonischemic cardiomyopathy ([place])   . Acute on chronic combined systolic and diastolic ACC/AHA stage C congestive heart failure ([place]) 04/03/2016  . Acute on chronic combined systolic and diastolic CHF, NYHA class 4 ([place]) 04/03/2016  . Hypertensive emergency 04/03/2016  . Cardiomyopathy due to hypertension ([place]) 12/22/2015  . Normal coronary arteries 12/22/2015  . Troponin level  elevated 12/22/2015  . NSTEMI (non-ST elevated myocardial infarction) ([place]) 12/20/2015  . Dental infection 10/10/2015  . Chest pain 09/05/2015  . Systolic CHF, chronic ([place]) 09/05/2015  . LLQ pain   . Type 2 diabetes mellitus without complication ([facility]) 00/17/4944  . Essential hypertension   . 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 Hypertensive urgency 04/22/2014  . Acute CHF ([place])  04/22/2014  . S/P cesarean section 04/11/2014  . Acute pulmonary edema ([place]) 04/11/2014  . Postoperative anemia 04/11/2014  . Elevated serum creatinine 04/11/2014  . Preeclampsia, severe 04/09/2014  . Pre-eclampsia superimposed on chronic hypertension, antepartum 04/08/2014  . Dyspnea 04/08/2014  . Polyhydramnios in third trimester, antepartum 03/14/2014  . Abnormal maternal glucose tolerance, antepartum 03/11/2014  . High-risk pregnancy 03/11/2014  . Pre-existing essential hypertension complicating pregnancy 96/75/9163  . Impaired glucose tolerance during pregnancy, antepartum 11/27/2013  . Leiomyoma of uterus 11/22/2013  . History of gestational diabetes in prior pregnancy, currently pregnant in first trimester 11/22/2013  . Hx of preeclampsia, prior pregnancy, currently pregnant 11/22/2013  . Short interval between pregnancies affecting pregnancy, antepartum 11/22/2013  . Supervision of high-risk pregnancy of elderly primigravida (>= 1 years old at delivery), third trimester 11/22/2013  . Miscarriage 03/19/2013  . Major depressive disorder, single episode, unspecified 09/27/2011  . Hypertension   . Hypertensive cardiovascular disease   . Microcytic anemia   . Osteoarthrosis involving lower leg 03/29/2011  . OSA on CPAP 12/09/2009  . Morbid obesity ([place]) 04/16/2009    Past Surgical History:  Procedure Laterality Date  . BREAST REDUCTION SURGERY  2002  . CARDIAC CATHETERIZATION N/A 12/22/2015   Procedure: Left Heart Cath and Coronary Angiography;  Surgeon: Peter M Martinique, MD;  Location: [facility];  Service: Cardiovascular;  Laterality: N/A;  . CESAREAN SECTION N/A 04/09/2014   Procedure: CESAREAN SECTION;  Surgeon: Mora Bellman, MD;  Location: [facility];  Service: Obstetrics;  Laterality: N/A;  . CHOLECYSTECTOMY       OB History    Gravida  11   Para  6   Term  5   Preterm  1   AB  5   Living  6     SAB  3   TAB  2   Ectopic      Multiple  0   Live  Births  6            Home Medications    Prior to Admission medications   Medication Sig Start Date End Date Taking? Authorizing Provider  albuterol (PROVENTIL HFA;VENTOLIN HFA) 108 (90 Base) MCG/ACT inhaler Inhale 1 puff into the lungs every 6 (six) hours as needed for wheezing or shortness of breath.   Yes [provider]  carvedilol (COREG) 25 MG tablet Take 1 tablet (25 mg total) by mouth 2 (two) times daily with a meal. 08/29/16  Yes Sinda Du, MD  citalopram (CELEXA) 20 MG tablet Take 20 mg by mouth daily. 08/19/17  Yes [provider]  gabapentin (NEURONTIN) 600 MG tablet Take 600 mg by mouth 3 (three) times daily.    Yes [provider]  hydrALAZINE (APRESOLINE) 50 MG tablet Take 75 mg by mouth 3 (three) times daily.   Yes [provider]  HYDROcodone-acetaminophen (NORCO/VICODIN) 5-325 MG tablet One tablet every four hours as needed for acute pain.  Limit of five days per [place]. 08/22/17  Yes Carole Civil, MD  naproxen (NAPROSYN) 500 MG tablet Take 500 mg by mouth 2 (two) times daily with  a meal.   Yes [provider]  omeprazole (PRILOSEC) 40 MG capsule Take 40 mg by mouth daily.   Yes [provider]  ondansetron (ZOFRAN) 4 MG tablet Take 4 mg by mouth daily as needed for nausea or vomiting.   Yes [provider]  oxyCODONE-acetaminophen (PERCOCET/ROXICET) 5-325 MG tablet Take 1 tablet by mouth every 4 (four) hours as needed for severe pain.   Yes [provider]  potassium chloride SA (K-DUR,KLOR-CON) 20 MEQ tablet Take 1 tablet (20 mEq total) by mouth daily. 05/12/17  Yes Holley Bouche, NP  pyridostigmine (MESTINON) 60 MG tablet Take 60 mg by mouth every 8 (eight) hours.   Yes [provider]  spironolactone (ALDACTONE) 25 MG tablet Take 1 tablet (25 mg total) by mouth daily. 05/30/17 09/29/17 Yes BranchAlphonse Guild, MD  torsemide (DEMADEX) 20 MG tablet Please take 40 mg oral  every morning and 20 mg every evening. Patient taking differently: Take 20-40 mg 2 (two) times daily by mouth. Please take 40 mg oral every morning and 20 mg every evening. 05/10/16  Yes Elgergawy, Silver Huguenin, MD  traZODone (DESYREL) 100 MG tablet Take 100 mg by mouth 3 (three) times daily after meals. 08/19/17  Yes [provider]  aspirin EC 81 MG tablet Take 81 mg by mouth daily.    [provider]  hydrALAZINE (APRESOLINE) 100 MG tablet Take 1 tablet (100 mg total) by mouth 3 (three) times daily. Patient not taking: Reported on 09/29/2017 07/12/17   Arnoldo Lenis, MD  insulin glargine (LANTUS) 100 UNIT/ML injection Inject 30 Units at bedtime into the skin.     [provider]  insulin lispro (HUMALOG) 100 UNIT/ML injection Inject into the skin 3 (three) times daily before meals.    [provider]  Lancets (FREESTYLE) lancets Use as instructed 04/09/16   Arrien, Jimmy Picket, MD  meloxicam (MOBIC) 15 MG tablet Take 1 tablet (15 mg total) by mouth daily. 06/08/17   Sanjuana Kava, MD  metFORMIN (GLUCOPHAGE) 500 MG tablet Take 500 mg 2 (two) times daily with a meal by mouth.    [provider]  nabumetone (RELAFEN) 750 MG tablet Take 1 tablet (750 mg total) by mouth 2 (two) times daily. One by mouth twice a day after eating. 06/08/17   Sanjuana Kava, MD  nitroGLYCERIN (NITROSTAT) 0.4 MG SL tablet Place 1 tablet (0.4 mg total) under the tongue every 5 (five) minutes as needed for chest pain. 09/05/15   Kathie Dike, MD  sacubitril-valsartan (ENTRESTO) 97-103 MG Take 1 tablet by mouth 2 (two) times daily. 04/09/16   Arrien, Jimmy Picket, MD    Family History Family History  Problem Relation Age of Onset  . Diabetes Mother   . Heart disease Mother   . Hyperlipidemia Paternal Grandfather   . Hypertension Paternal Grandfather   . Heart disease Father   . Hypertension Father   . Heart disease Maternal Grandmother   . ADD / ADHD Son   .  Hypertension Maternal Uncle   . Heart attack Brother   . Sudden death Neg Hx     Social History Social History   Tobacco Use  . Smoking status: Never Smoker  . Smokeless tobacco: Never Used  Substance Use Topics  . Alcohol use: Yes    Comment: occ  . Drug use: No     Allergies   Diclofenac; Tramadol; and Vicodin [hydrocodone-acetaminophen]   Review of Systems Review of Systems  Constitutional: Negative for  activity change.  HENT:       Phonophobia  Eyes: Positive for photophobia and visual disturbance.  Respiratory: Positive for shortness of breath.   Cardiovascular: Positive for leg swelling. Negative for chest pain.  Gastrointestinal: Positive for nausea. Negative for abdominal pain.  Genitourinary: Negative for dysuria.  Musculoskeletal: Negative for back pain.  Skin: Negative for rash.  Allergic/Immunologic: Negative for immunocompromised state.  Neurological: Positive for headaches. Negative for dizziness, syncope and weakness.  Psychiatric/Behavioral: Negative for confusion.   Physical Exam Updated Vital Signs BP (!) 142/81 (BP Location: Right Arm)   Pulse 74   Temp 98.1 F (36.7 C) (Oral)   Resp 16   Ht 5\' 6"  (1.676 m)   Wt 114.3 kg (252 lb)   LMP 09/16/2017   SpO2 100%   BMI 40.67 kg/m   Physical Exam  Constitutional: She is oriented to person, place, and time.  Non-toxic appearance. No distress.  Morbidly obese.  HENT:  Head: Normocephalic and atraumatic.  Mouth/Throat: Oropharynx is clear and moist.  Eyes: Pupils are equal, round, and reactive to light. Conjunctivae and EOM are normal.  Neck: Normal range of motion. Neck supple. No hepatojugular reflux and no JVD present.  Cardiovascular: Normal rate and regular rhythm. Exam reveals no gallop and no friction rub.  No murmur heard. Pulmonary/Chest: Effort normal and breath sounds normal. No stridor. No respiratory distress. She has no wheezes. She has no rhonchi. She has no rales. She exhibits no  tenderness.  No increased work of breathing.  Abdominal: Soft. She exhibits no distension and no mass. There is no tenderness. There is no guarding.  Musculoskeletal:       Right lower leg: She exhibits edema. She exhibits no tenderness.       Left lower leg: She exhibits edema. She exhibits no tenderness.  Neurological: She is alert and oriented to person, place, and time. GCS eye subscore is 4. GCS verbal subscore is 5. GCS motor subscore is 6.  Cranial nerves II through XII are grossly intact.  Symmetric tandem gait.  5 out of 5 strength against resistance of the bilateral upper and lower extremities.  Sensation is intact throughout.  Finger-nose is intact bilaterally.  No ataxia.  Skin: Skin is warm. Capillary refill takes less than 2 seconds. No rash noted.  Psychiatric: Her behavior is normal.  Nursing note and vitals reviewed.  ED Treatments / Results  Labs (all labs ordered are listed, but only abnormal results are displayed) Labs Reviewed  BASIC METABOLIC PANEL - Abnormal; Notable for the following components:      Result Value   Potassium 3.2 (*)    Chloride 99 (*)    Glucose, Bld 262 (*)    Creatinine, Ser 1.02 (*)    Calcium 8.6 (*)    All other components within normal limits  CBC - Abnormal; Notable for the following components:   WBC 11.6 (*)    Hemoglobin 10.3 (*)    HCT 33.3 (*)    MCV 77.8 (*)    MCH 24.1 (*)    All other components within normal limits  BRAIN NATRIURETIC PEPTIDE - Abnormal; Notable for the following components:   B Natriuretic Peptide 683.5 (*)    All other components within normal limits  TROPONIN I  TROPONIN I  I-STAT BETA HCG BLOOD, ED ([facility], [facility], [facility] ONLY)    EKG EKG Interpretation  Date/Time:  Wednesday Sep 28 2017 23:36:35 EDT Ventricular Rate:  88 PR Interval:  160 QRS  Duration: 90 QT Interval:  414 QTC Calculation: 500 R Axis:   -13 Text Interpretation:  Normal sinus rhythm Biatrial enlargement Nonspecific T wave abnormality  Prolonged QT Confirmed by Randal Buba, April ([id number]) on 09/29/2017 7:01:16 AM   Radiology Dg Chest 2 View  Result Date: 09/29/2017 CLINICAL DATA:  Short of breath EXAM: CHEST - 2 VIEW COMPARISON:  CT 07/13/2017, radiograph 07/13/2017, 05/27/2017 FINDINGS: Cardiomegaly.  No acute consolidation or effusion.  No pneumothorax. IMPRESSION: No active cardiopulmonary disease.  Cardiomegaly Electronically Signed   By: Donavan Foil M.D.   On: 09/29/2017 00:45   Ct Head Wo Contrast  Result Date: 09/29/2017 CLINICAL DATA:  Bilateral lower extremity swelling.  Headache. EXAM: CT HEAD WITHOUT CONTRAST TECHNIQUE: Contiguous axial images were obtained from the base of the skull through the vertex without intravenous contrast. COMPARISON:  12/08/2015 FINDINGS: Brain: No acute intracranial abnormality. Specifically, no hemorrhage, hydrocephalus, mass lesion, acute infarction, or significant intracranial injury. Vascular: No hyperdense vessel or unexpected calcification. Skull: No acute calvarial abnormality. Sinuses/Orbits: Visualized paranasal sinuses and mastoids clear. Orbital soft tissues unremarkable. Other: None IMPRESSION: Unremarkable study. Electronically Signed   By: Rolm Baptise M.D.   On: 09/29/2017 09:48    Procedures Procedures (including critical care time)  Medications Ordered in ED Medications  carvedilol (COREG) tablet 25 mg (25 mg Oral Given 09/29/17 0742)  aspirin EC tablet 81 mg (81 mg Oral Not Given 09/29/17 0935)  hydrALAZINE (APRESOLINE) tablet 100 mg (100 mg Oral Given 09/29/17 1328)  insulin glargine (LANTUS) injection 30 Units (has no administration in time range)  metFORMIN (GLUCOPHAGE) tablet 500 mg (500 mg Oral Given 09/29/17 0742)  furosemide (LASIX) injection 80 mg (80 mg Intravenous Given 09/29/17 0752)  ketorolac (TORADOL) 30 MG/ML injection 30 mg (30 mg Intravenous Given 09/29/17 0735)  potassium chloride SA (K-DUR,KLOR-CON) CR tablet 40 mEq (40 mEq Oral Given 09/29/17 0742)    LORazepam (ATIVAN) injection 0.5 mg (0.5 mg Intravenous Given 09/29/17 0737)  morphine 4 MG/ML injection 4 mg (4 mg Intravenous Given 09/29/17 1039)  labetalol (NORMODYNE,TRANDATE) injection 10 mg (10 mg Intravenous Given 09/29/17 1149)  LORazepam (ATIVAN) injection 0.5 mg (0.5 mg Intravenous Given 09/29/17 1150)  labetalol (NORMODYNE,TRANDATE) injection 10 mg (10 mg Intravenous Given 09/29/17 1326)  cloNIDine (CATAPRES) tablet 0.2 mg (0.2 mg Oral Given 09/29/17 1337)     Initial Impression / Assessment and Plan / ED Course  I have reviewed the triage vital signs and the nursing notes.  Pertinent labs & imaging results that were available during my care of the patient were reviewed by me and considered in my medical decision making (see chart for details).     42 year old with history of HTN, hypertensive heart disease, pulmonary edema, acute on chronic systolic and diastolic CHF, Class IV NYHA Class 4, DM type II, anemia who presents to the emergency department with elevated blood pressure, headache, 12 pound weight gain, abdominal and lower extremity swelling.  She weighs herself daily and reports that she has put on 12 pounds over the last week.  Her weight yesterday was 252, up from 240 despite taking extra doses of torsemide over the last few days.  Blood pressure was 220/126 on arrival.  The patient's medical chart was reviewed.  During her last admission in November 2018 the patient's hypertension responded to her home medications and 80 mg of IV Lasix.  Her home antihypertensives and IV Lasix was ordered with no improvement in her blood pressure.  She was given IV labetalol x2 with  minimal improvement in her blood pressure to 170s over 120s.  Given elevated blood pressure with signs of endorgan damage, CT head was ordered, which was negative.  She has new T wave inversion on her EKG.  QTC is 500, which is improved from her QTC of 510 previously.  Troponin is negative.  Ativan given for  nausea.  Toradol given for pain with no improvement, but she significantly improved after a small dose of morphine.  Doubt ACS, PE,  pneumonia, or ICH.  The patient was seen and evaluated with Dr. Venora Maples, attending physician.  Given continued elevated blood pressure and failed treatment of managing her symptoms with her home torsemide, I feel that admission is warranted.  The patient also has insight into her medical conditions and feels that she would benefit from admission given previous acute exacerbations of her CHF.  Spoke with Dr. Reesa Chew of the internal resident teaching service who will accept the admission. The patient appears reasonably stabilized for admission considering the current resources, flow, and capabilities available in the ED at this time, and I doubt any other [facility] requiring further screening and/or treatment in the ED prior to admission.  Final Clinical Impressions(s) / ED Diagnoses   Final diagnoses:  Acute on chronic combined systolic and diastolic CHF, NYHA class 4 ([place])  Hypertensive urgency  Bad headache    ED Discharge Orders    None       Joanne Gavel, PA-C 09/29/17 1639    Jola Schmidt, MD 09/30/17 [phone]

## 2017-09-30 DIAGNOSIS — Z794 Long term (current) use of insulin: Secondary | ICD-10-CM | POA: Diagnosis not present

## 2017-09-30 DIAGNOSIS — R519 Headache, unspecified: Secondary | ICD-10-CM | POA: Diagnosis present

## 2017-09-30 DIAGNOSIS — Z79899 Other long term (current) drug therapy: Secondary | ICD-10-CM | POA: Diagnosis not present

## 2017-09-30 DIAGNOSIS — I5021 Acute systolic (congestive) heart failure: Secondary | ICD-10-CM | POA: Diagnosis not present

## 2017-09-30 DIAGNOSIS — I161 Hypertensive emergency: Secondary | ICD-10-CM | POA: Diagnosis not present

## 2017-09-30 DIAGNOSIS — G4733 Obstructive sleep apnea (adult) (pediatric): Secondary | ICD-10-CM | POA: Diagnosis not present

## 2017-09-30 DIAGNOSIS — Z888 Allergy status to other drugs, medicaments and biological substances status: Secondary | ICD-10-CM | POA: Diagnosis not present

## 2017-09-30 DIAGNOSIS — I110 Hypertensive heart disease with heart failure: Secondary | ICD-10-CM | POA: Diagnosis not present

## 2017-09-30 DIAGNOSIS — E119 Type 2 diabetes mellitus without complications: Secondary | ICD-10-CM | POA: Diagnosis not present

## 2017-09-30 DIAGNOSIS — R51 Headache: Secondary | ICD-10-CM

## 2017-09-30 DIAGNOSIS — E876 Hypokalemia: Secondary | ICD-10-CM | POA: Diagnosis not present

## 2017-09-30 LAB — BASIC METABOLIC PANEL
Anion gap: 10 (ref 5–15)
BUN: 15 mg/dL (ref 6–20)
CO2: 30 mmol/L (ref 22–32)
Calcium: 9 mg/dL (ref 8.9–10.3)
Chloride: 98 mmol/L — ABNORMAL LOW (ref 101–111)
Creatinine, Ser: 0.81 mg/dL (ref 0.44–1.00)
GFR calc Af Amer: >60 mL/min (ref >60)
GFR calc non Af Amer: >60 mL/min (ref >60)
Glucose, Bld: 183 mg/dL — ABNORMAL HIGH (ref 65–99)
Potassium: 3.7 mmol/L (ref 3.5–5.1)
Sodium: 138 mmol/L (ref 135–145)

## 2017-09-30 LAB — CBC
HCT: 35.5 % — ABNORMAL LOW (ref 36.0–46.0)
Hemoglobin: 10.7 g/dL — ABNORMAL LOW (ref 12.0–15.0)
MCH: 23.4 pg — ABNORMAL LOW (ref 26.0–34.0)
MCHC: 30.1 g/dL (ref 30.0–36.0)
MCV: 77.7 fL — ABNORMAL LOW (ref 78.0–100.0)
Platelets: 280 10*3/uL (ref 150–400)
RBC: 4.57 MIL/uL (ref 3.87–5.11)
RDW: 14.1 % (ref 11.5–15.5)
WBC: 9.8 10*3/uL (ref 4.0–10.5)

## 2017-09-30 LAB — GLUCOSE, CAPILLARY
Glucose-Capillary: 281 mg/dL — ABNORMAL HIGH (ref 65–99)
Glucose-Capillary: 211 mg/dL — ABNORMAL HIGH (ref 65–99)
Glucose-Capillary: 232 mg/dL — ABNORMAL HIGH (ref 65–99)

## 2017-09-30 LAB — MRSA PCR SCREENING: MRSA by PCR: POSITIVE — AB

## 2017-09-30 MED ORDER — CHLORHEXIDINE GLUCONATE CLOTH 2 % EX PADS: 6.0000 | MEDICATED_PAD | Freq: Every day | CUTANEOUS | Status: DC

## 2017-09-30 MED ORDER — MUPIROCIN 2 % EX OINT: 1.0000 "application " | TOPICAL_OINTMENT | Freq: Two times a day (BID) | CUTANEOUS | Status: DC

## 2017-09-30 MED ORDER — MUPIROCIN 2 % EX OINT: 1.0000 "application " | TOPICAL_OINTMENT | Freq: Two times a day (BID) | CUTANEOUS | 0 refills | Status: DC

## 2017-09-30 MED ORDER — SPIRONOLACTONE 50 MG PO TABS: 50.0000 mg | ORAL_TABLET | Freq: Every day | ORAL | 3 refills | Status: DC

## 2017-09-30 MED ORDER — MUPIROCIN 2 % EX OINT
1.0000 "application " | TOPICAL_OINTMENT | Freq: Two times a day (BID) | CUTANEOUS | Status: DC
  Administered 2017-09-30: 1 via NASAL
  Filled 2017-09-30: qty 22

## 2017-09-30 MED ORDER — SPIRONOLACTONE 25 MG PO TABS
50.0000 mg | ORAL_TABLET | Freq: Every day | ORAL | Status: DC
  Administered 2017-09-30: 50 mg via ORAL
  Filled 2017-09-30: qty 2

## 2017-09-30 MED ORDER — CHLORHEXIDINE GLUCONATE CLOTH 2 % EX PADS
6.0000 | MEDICATED_PAD | Freq: Every day | CUTANEOUS | Status: DC
  Administered 2017-09-30: 6 via TOPICAL

## 2017-09-30 NOTE — Discharge Summary (Signed)
Name: Chelsey Santiago MRN: [id number] DOB: [birth year] 42 y.o. PCP: Rosita Fire, MD  Date of Admission: 09/28/2017 11:56 PM Date of Discharge:  Attending Physician: Oda Kilts, MD  Discharge Diagnosis: CHF Exacerbation Hypertensive urgency.  Principal Problem:   CHF exacerbation ([place]) Active Problems:   OSA on CPAP   Hypertensive cardiovascular disease   Bad headache  Discharge Medications: Allergies as of 09/30/2017      Reactions   Diclofenac Swelling   AND POSSIBLE SYNCOPE; tolerates ibuprofen per pt   Tramadol Nausea And Vomiting   Itching (12/21); tolerates ibuprofen per pt   Vicodin [hydrocodone-acetaminophen] Itching, Nausea Only      Medication List    STOP taking these medications   HYDROcodone-acetaminophen 5-325 MG tablet Commonly known as:  NORCO/VICODIN   Magnesium 400 MG Tabs     TAKE these medications   albuterol 108 (90 Base) MCG/ACT inhaler Commonly known as:  PROVENTIL HFA;VENTOLIN HFA Inhale 1 puff into the lungs every 6 (six) hours as needed for wheezing or shortness of breath.   aspirin EC 81 MG tablet Take 81 mg by mouth daily.   carvedilol 25 MG tablet Commonly known as:  COREG Take 1 tablet (25 mg total) by mouth 2 (two) times daily with a meal.   CELEXA 20 MG tablet Generic drug:  citalopram Take 20 mg by mouth daily.   freestyle lancets Use as instructed   gabapentin 600 MG tablet Commonly known as:  NEURONTIN Take 600 mg by mouth 3 (three) times daily.   hydrALAZINE 100 MG tablet Commonly known as:  APRESOLINE Take 1 tablet (100 mg total) by mouth 3 (three) times daily. What changed:  Another medication with the same name was removed. Continue taking this medication, and follow the directions you see here.   insulin glargine 100 UNIT/ML injection Commonly known as:  LANTUS Inject 30 Units at bedtime into the skin.   insulin lispro 100 UNIT/ML injection Commonly known as:  HUMALOG Inject into the skin 3  (three) times daily before meals.   meloxicam 15 MG tablet Commonly known as:  MOBIC Take 1 tablet (15 mg total) by mouth daily.   metFORMIN 500 MG tablet Commonly known as:  GLUCOPHAGE Take 500 mg 2 (two) times daily with a meal by mouth.   mupirocin ointment 2 % Commonly known as:  BACTROBAN Place 1 application into the nose 2 (two) times daily.   nabumetone 750 MG tablet Commonly known as:  RELAFEN Take 1 tablet (750 mg total) by mouth 2 (two) times daily. One by mouth twice a day after eating.   naproxen 500 MG tablet Commonly known as:  NAPROSYN Take 500 mg by mouth 2 (two) times daily with a meal.   nitroGLYCERIN 0.4 MG SL tablet Commonly known as:  NITROSTAT Place 1 tablet (0.4 mg total) under the tongue every 5 (five) minutes as needed for chest pain.   omeprazole 40 MG capsule Commonly known as:  PRILOSEC Take 40 mg by mouth daily.   ondansetron 4 MG tablet Commonly known as:  ZOFRAN Take 4 mg by mouth daily as needed for nausea or vomiting.   oxyCODONE-acetaminophen 5-325 MG tablet Commonly known as:  PERCOCET/ROXICET Take 1 tablet by mouth every 4 (four) hours as needed for severe pain.   potassium chloride SA 20 MEQ tablet Commonly known as:  K-DUR,KLOR-CON Take 1 tablet (20 mEq total) by mouth daily.   pyridostigmine 60 MG tablet Commonly known as:  MESTINON Take 60 mg  by mouth every 8 (eight) hours.   sacubitril-valsartan 97-103 MG Commonly known as:  ENTRESTO Take 1 tablet by mouth 2 (two) times daily.   spironolactone 50 MG tablet Commonly known as:  ALDACTONE Take 1 tablet (50 mg total) by mouth daily. What changed:    medication strength  how much to take   torsemide 20 MG tablet Commonly known as:  DEMADEX Please take 40 mg oral every morning and 20 mg every evening. What changed:    how much to take  how to take this  when to take this  additional instructions   traZODone 100 MG tablet Commonly known as:  DESYREL Take 100  mg by mouth 3 (three) times daily after meals.       Disposition and follow-up:   ChelseyDaria Rodena Medin was discharged from [facility] in Good condition.  At the hospital follow up visit please address:  1.  Hypertension-- spironolactone was increased, check potassium levels. -She is on multiple NSAID-please review with patient and clean up the med list.  2.  Labs / imaging needed at time of follow-up: BMP (check K, Cr)  3.  Pending labs/ test needing follow-up: none  Follow-up Appointments: Follow-up Information    Rosita Fire, MD. Schedule an appointment as soon as possible for a visit on 09/30/2017.   Specialty:  Internal Medicine Why:  Please call Dr. Legrand Rams to make a follow up appointment in the next week. Contact information: [place] [address] [phone]        Arnoldo Lenis, MD. Go on 10/17/2017.   Specialty:  Cardiology Why:  You informed us that you had an appointment with your cardiologist on the third of June. Please follow up for further management of your heart failure/hypertension medications. Contact information: [address] [place] [address] [phone]           Hospital Course by problem list: Principal Problem:   CHF exacerbation ([place]) Active Problems:   OSA on CPAP   Hypertensive cardiovascular disease   Bad headache   [name] is a 7 yoF with a PMH of IDA, systolic CHF, DM, NSTEMI, HTN, OSA and OA who presented to the ED with SOB, headache and ankle swelling c/f CHF exacerbation  #CHF Exacerbation, Hypertensive Emergency: Chelsey Santiago labs, signs and symptoms on admission were consistent with CHF exacerbation secondary to hypertensive emergency. Highest recorded BP was 220/126, accompanied by a headache and vision changes. In the emergency department, a CT Head was negative for acute intracranial process and chest x-ray was negative for infectious or chronic process. Troponins  were negative for the duration of this admission. Her weight on admission was 252 lbs with a reported dry weight of 243 lbs. She was aggressively diuresed with IV lasix overnight. After responding appropriately (4+ lb weight loss), she was switched back to oral torsemide dose. Because her blood pressures remained high on her home regimen, her spironolactone was increased to 50 mg daily.  #Hypokalemia:Potassium was low to 3.2 on admission. Her EKG remained unchanged, still with inverted T waves and a prolonged QT interval. She was repleted with oral potassium supplementation and a repeat level was 3.7. At discharge, she will restarting home torsemide and K supplementation with new spironolactone as described above. She was notified that she may have to adjust her oral dose in the future due to these changes.   Other chronic conditions (diabetes, iron deficiency anemia, OSA) were monitored and she was  replaced on her home regimen at discharge.  Discharge Vitals:   BP (!) 156/98 (BP Location: Left Arm)   Pulse 88   Temp 97.9 F (36.6 C) (Oral)   Resp 18   Ht 5\' 6"  (1.676 m)   Wt 248 lb (112.5 kg)   LMP 09/16/2017   SpO2 100%   BMI 40.03 kg/m   Pertinent Labs, Studies, and Procedures:  CXR WNL, Head CT WNL  Discharge Instructions: Discharge Instructions    Diet - low sodium heart healthy   Complete by:  As directed    Discharge instructions   Complete by:  As directed    It was pleasure taking care of you. As we discussed we increased the dose of your Spironolactone from 25 mg daily to 50 mg.  Continue taking your rest of medicines as directed. Please make an follow-up appointment with your PCP to be seen next week-you will need a blood work-up to check on your potassium during that appointment. Please weigh yourself daily and if you noticed a 3 pound increase in 1 day or 5 pound increase in 1 week take an extra dose of torsemide and contact your primary care.   Increase activity  slowly   Complete by:  As directed       Signed: Laureen Ochs, Medical Student 09/30/2017, 11:19 AM   Pager: [phone]  Attestation for Student Documentation:  I personally was present and performed or re-performed the history, physical exam and medical decision-making activities of this service and have verified that the service and findings are accurately documented in the student's note.  Lorella Nimrod, MD 09/30/2017, 12:05 PM

## 2017-09-30 NOTE — Progress Notes (Signed)
Subjective: [name] was resting comfortably in her room when the team visited on rounds. She states that she feels as though she is back to her baseline after receiving IV diuretics overnight.  She continues to complain of headache intermittently for which she has received toradol. She denies chest pain, shortness of breath or orthopnea.  Objective:  Vital signs in last 24 hours: Vitals:   09/30/17 0537 09/30/17 0538 09/30/17 0600 09/30/17 0845  BP: (!) 174/112 (!) 176/116  (!) 156/98  Pulse: 76 77  88  Resp: 17   18  Temp: 98.4 F (36.9 C)   97.9 F (36.6 C)  TempSrc: Oral   Oral  SpO2: 100% 97%  100%  Weight:   248 lb (112.5 kg)   Height:       Physical Exam  Constitutional: She is oriented to person, place, and time. She appears well-developed and well-nourished.  Cardiovascular: Normal rate, regular rhythm and intact distal pulses.  Pulmonary/Chest: Effort normal and breath sounds normal. She has no rales.  Abdominal: Soft. Bowel sounds are normal.  Musculoskeletal: Normal range of motion.       Right lower leg: Right lower leg edema: trace, improved.       Left lower leg: Left lower leg edema: trace, improved.  Neurological: She is alert and oriented to person, place, and time.  Skin: Skin is warm and dry.  Psychiatric: She has a normal mood and affect. Her behavior is normal.   BMP Latest Ref Rng & Units 09/30/2017 09/29/2017 07/11/2017  Glucose 65 - 99 mg/dL 183(H) 262(H) 176(H)  BUN 6 - 20 mg/dL 15 17 23(H)  Creatinine 0.44 - 1.00 mg/dL 0.81 1.02(H) 0.92  Sodium 135 - 145 mmol/L 138 137 137  Potassium 3.5 - 5.1 mmol/L 3.7 3.2(L) 3.9  Chloride 101 - 111 mmol/L 98(L) 99(L) 99(L)  CO2 22 - 32 mmol/L 30 26 27   Calcium 8.9 - 10.3 mg/dL 9.0 8.6(L) 10.1    Filed Weights   09/29/17 0011 09/29/17 1813 09/30/17 0600  Weight: 252 lb (114.3 kg) 253 lb 4.8 oz (114.9 kg) 248 lb (112.5 kg)   CBC Latest Ref Rng & Units 09/30/2017 09/29/2017 07/11/2017  WBC 4.0 - 10.5 K/uL  9.8 11.6(H) 11.8(H)  Hemoglobin 12.0 - 15.0 g/dL 10.7(L) 10.3(L) 10.9(L)  Hematocrit 36.0 - 46.0 % 35.5(L) 33.3(L) 36.5  Platelets 150 - 400 K/uL 280 270 228     Assessment/Plan:  Principal Problem:   CHF exacerbation ([facility]) Active Problems:   OSA on CPAP   Hypertensive cardiovascular disease  #CHF Exacerbation 2/2 Hypertensive Emergency: [name] presented to the emergency room with signs and symptoms concerning for hypertensive emergency and CHF exacerbation. Highest recorded BP was 220/126 accompanied by a headache. CT Head was negative for acute intracranial process. Her shortness of breath is likely secondary to her fluid overload, which may have been brought on by this hypertensive episode. She does use an inhaler for symptoms but denies having COPD or asthma. CXR negative for infectious or chronic process and lungs clear on exam. - Admission weight 252 lb, now 248 lb >> -4 lb, reported dry wgt 243 lb - Strict I&O, daily weights - Return to po home torsemide - QTc of 500; avoid prolonging agents - Troponins neg - Continue home entresto, hydralazine - Incr home spironolactone to 50mg  qd - Nitrogly prn  #Hypokalemia: 3.2 on admission. Prolonged QT, no new EKG changes. - Replete as indicated, restarting home torsemide/K supp, spironolactone - Repeat BMP  with improvement to 3.7   #IDA: Hgb 10.7, uptrending slowly. CTM.  #DM: Continue Lantus 30 units at bedtime with SSI. - Continue gabapentin 300mg  TID.  #OSA: Encourage home CPAP use. Patient declined during inpatient admission.  #PPX: Lovenox #GI: PEG prn   Dispo: Anticipated discharge in approximately 0 day(s).   Laureen Ochs, Medical Student 09/30/2017, 8:49 AM Pager: [phone]  Attestation for Student Documentation:  I personally was present and performed or re-performed the history, physical exam and medical decision-making activities of this service and have verified that the service and findings are  accurately documented in the student's note.  Lorella Nimrod, MD 09/30/2017, 12:02 PM

## 2017-10-03 ENCOUNTER — Ambulatory Visit: Payer: Medicaid Other | Admitting: "Endocrinology

## 2017-10-05 ENCOUNTER — Ambulatory Visit: Payer: Self-pay | Admitting: Orthopaedic Surgery

## 2017-10-17 ENCOUNTER — Encounter: Payer: Self-pay | Admitting: Cardiology

## 2017-10-17 ENCOUNTER — Other Ambulatory Visit (HOSPITAL_COMMUNITY)
Admission: RE | Admit: 2017-10-17 | Discharge: 2017-10-17 | Disposition: A | Discharge status: Home / Self Care | Payer: Medicaid Other | Source: Ambulatory Visit | Attending: Cardiology | Admitting: Cardiology

## 2017-10-17 ENCOUNTER — Encounter

## 2017-10-17 ENCOUNTER — Encounter: Payer: Self-pay | Admitting: Gastroenterology

## 2017-10-17 ENCOUNTER — Ambulatory Visit: Payer: Medicaid Other | Admitting: Cardiology

## 2017-10-17 ENCOUNTER — Ambulatory Visit (INDEPENDENT_AMBULATORY_CARE_PROVIDER_SITE_OTHER): Payer: Medicaid Other | Admitting: Gastroenterology

## 2017-10-17 VITALS — BP 188/118 | HR 85 | Ht 66.0 in | Wt 248.0 lb

## 2017-10-17 VITALS — BP 177/104 | HR 82 | Temp 96.9°F | Ht 66.0 in | Wt 248.2 lb

## 2017-10-17 DIAGNOSIS — D509 Iron deficiency anemia, unspecified: Secondary | ICD-10-CM | POA: Diagnosis not present

## 2017-10-17 DIAGNOSIS — K5900 Constipation, unspecified: Secondary | ICD-10-CM | POA: Diagnosis not present

## 2017-10-17 DIAGNOSIS — R198 Other specified symptoms and signs involving the digestive system and abdomen: Secondary | ICD-10-CM | POA: Insufficient documentation

## 2017-10-17 DIAGNOSIS — I10 Essential (primary) hypertension: Secondary | ICD-10-CM

## 2017-10-17 DIAGNOSIS — I5022 Chronic systolic (congestive) heart failure: Secondary | ICD-10-CM | POA: Diagnosis not present

## 2017-10-17 DIAGNOSIS — R197 Diarrhea, unspecified: Secondary | ICD-10-CM | POA: Insufficient documentation

## 2017-10-17 DIAGNOSIS — G4733 Obstructive sleep apnea (adult) (pediatric): Secondary | ICD-10-CM | POA: Diagnosis not present

## 2017-10-17 LAB — BASIC METABOLIC PANEL
ANION GAP: 10 (ref 5–15)
BUN: 21 mg/dL — ABNORMAL HIGH (ref 6–20)
CALCIUM: 9.2 mg/dL (ref 8.9–10.3)
CO2: 27 mmol/L (ref 22–32)
CREATININE: 1.04 mg/dL — AB (ref 0.44–1.00)
Chloride: 98 mmol/L — ABNORMAL LOW (ref 101–111)
Glucose, Bld: 283 mg/dL — ABNORMAL HIGH (ref 65–99)
Potassium: 3.6 mmol/L (ref 3.5–5.1)
SODIUM: 135 mmol/L (ref 135–145)

## 2017-10-17 LAB — MAGNESIUM: MAGNESIUM: 1.6 mg/dL — AB (ref 1.7–2.4)

## 2017-10-17 MED ORDER — CARVEDILOL 25 MG PO TABS: 37.5000 mg | ORAL_TABLET | Freq: Two times a day (BID) | ORAL | 12 refills | Status: DC

## 2017-10-17 MED ORDER — LUBIPROSTONE 24 MCG PO CAPS: 24.0000 ug | ORAL_CAPSULE | Freq: Two times a day (BID) | ORAL | 0 refills | Status: DC

## 2017-10-17 NOTE — Patient Instructions (Signed)
Medication Instructions:   Your physician has recommended you make the following change in your medication:   Increase carvedilol to 37.5 mg by mouth twice daily. Please take 1&1/2 of your 25 mg tablets twice daily. New prescription sent to your pharmacy.  Continue all other medications the same.  Labwork:  Your physician recommends that you have lab work today to check your BMET & Mg levels.  Testing/Procedures:  NONE  Follow-Up:  Your physician recommends that you schedule a follow-up appointment in: 2 weeks with the nurse to have your weight and vital signs checked.  Your physician recommends that you schedule a follow-up appointment in: 6 weeks with your provider.  Any Other Special Instructions Will Be Listed Below (If Applicable).  If you need a refill on your cardiac medications before your next appointment, please call your pharmacy.

## 2017-10-17 NOTE — Patient Instructions (Signed)
1. Try Amitiza 21mcg one to two times daily with food for constipation. Call for prescription if helpful.

## 2017-10-17 NOTE — Progress Notes (Signed)
Clinical Summary [name] is a 42 y.o.female seen today for follow up of the following medical problems.  1. HTN - history of difficult to control bp - during recent admission aldactone increaed to 50mg  daily, has not had repeat labs yet - compliant with meds - suspect component of her HTN is related to untreated OSA, she is awaiting her cpap machine.      2. Chronic systolic heart failure - 11/4257 echo: LVEF 40-45%, grade II diastolic dysfunction - 09/6385 cath with normal coronaries - 06/2017 echo LVEF 35-40%, grade II diastolic dysfunction  - admit 09/2017 with CHF exacerbation and severe HTN - Her weight on admission was 252 lbs with a reported dry weight of 243 lbs - aldactone increased to 50mg  daily due to severe HTN that admission   - no recent edema since discharge. SOB at times. Has not been checking weights at home.  - compliant with meds - torsemide 40mg  and 20mg  in PM - limiting sodium intake.   3. OSA  - followed by Dr Luan Pulling - 06/2017 sleep study moderate OSA - has not picked up CPAP machine.      Past Medical History:  Diagnosis Date  . Anemia    H&H of 10.6/33 and 07/2008 and 11.9/35 and 09/2010  . Anxiety   . CHF (congestive heart failure) ([place])   . Depression with anxiety   . Diabetes mellitus without complication ([place])   . Enlarged heart   . Fasting hyperglycemia   . Hypertension    Lab: Normal BMet except glucose of 118 in 09/2010  . Hypertensive heart disease 2009   Pulmonary edema postpartum; mild to moderate mitral regurgitation when hospitalized for CHF in 2009; Echocardiogram in 12/2009-no MR and normal EF; normal CXR in 09/2010  . Migraine headache   . Miscarriage 03/19/2013  . Obesity 04/16/2009  . Osteoarthritis, knee 03/29/2011  . Preeclampsia   . Pregnant   . Pulmonary edema   . Sleep apnea   . Threatened abortion in early pregnancy 03/15/2013     Allergies  Allergen Reactions  . Diclofenac Swelling    AND POSSIBLE  SYNCOPE; tolerates ibuprofen per pt  . Tramadol Nausea And Vomiting    Itching (12/21); tolerates ibuprofen per pt  . Vicodin [Hydrocodone-Acetaminophen] Itching and Nausea Only     Current Outpatient Medications  Medication Sig Dispense Refill  . albuterol (PROVENTIL HFA;VENTOLIN HFA) 108 (90 Base) MCG/ACT inhaler Inhale 1 puff into the lungs every 6 (six) hours as needed for wheezing or shortness of breath.    Marland Kitchen aspirin EC 81 MG tablet Take 81 mg by mouth daily.    . carvedilol (COREG) 25 MG tablet Take 1 tablet (25 mg total) by mouth 2 (two) times daily with a meal. 60 tablet 12  . citalopram (CELEXA) 20 MG tablet Take 20 mg by mouth daily.    Marland Kitchen gabapentin (NEURONTIN) 600 MG tablet Take 600 mg by mouth 3 (three) times daily.     . hydrALAZINE (APRESOLINE) 100 MG tablet Take 1 tablet (100 mg total) by mouth 3 (three) times daily. (Patient not taking: Reported on 09/29/2017) 270 tablet 3  . insulin glargine (LANTUS) 100 UNIT/ML injection Inject 30 Units at bedtime into the skin.     Marland Kitchen insulin lispro (HUMALOG) 100 UNIT/ML injection Inject into the skin 3 (three) times daily before meals.    . Lancets (FREESTYLE) lancets Use as instructed 100 each 12  . meloxicam (MOBIC) 15 MG tablet Take 1  tablet (15 mg total) by mouth daily. 30 tablet 54  . metFORMIN (GLUCOPHAGE) 500 MG tablet Take 500 mg 2 (two) times daily with a meal by mouth.    . mupirocin ointment (BACTROBAN) 2 % Place 1 application into the nose 2 (two) times daily. 22 g 0  . nabumetone (RELAFEN) 750 MG tablet Take 1 tablet (750 mg total) by mouth 2 (two) times daily. One by mouth twice a day after eating. 60 tablet 5  . naproxen (NAPROSYN) 500 MG tablet Take 500 mg by mouth 2 (two) times daily with a meal.    . nitroGLYCERIN (NITROSTAT) 0.4 MG SL tablet Place 1 tablet (0.4 mg total) under the tongue every 5 (five) minutes as needed for chest pain. 30 tablet 12  . omeprazole (PRILOSEC) 40 MG capsule Take 40 mg by mouth daily.    .  ondansetron (ZOFRAN) 4 MG tablet Take 4 mg by mouth daily as needed for nausea or vomiting.    Marland Kitchen oxyCODONE-acetaminophen (PERCOCET/ROXICET) 5-325 MG tablet Take 1 tablet by mouth every 4 (four) hours as needed for severe pain.    . potassium chloride SA (K-DUR,KLOR-CON) 20 MEQ tablet Take 1 tablet (20 mEq total) by mouth daily. 30 tablet 0  . pyridostigmine (MESTINON) 60 MG tablet Take 60 mg by mouth every 8 (eight) hours.    . sacubitril-valsartan (ENTRESTO) 97-103 MG Take 1 tablet by mouth 2 (two) times daily. 60 tablet 0  . spironolactone (ALDACTONE) 50 MG tablet Take 1 tablet (50 mg total) by mouth daily. 90 tablet 3  . torsemide (DEMADEX) 20 MG tablet Please take 40 mg oral every morning and 20 mg every evening. (Patient taking differently: Take 20-40 mg 2 (two) times daily by mouth. Please take 40 mg oral every morning and 20 mg every evening.) 90 tablet 0  . traZODone (DESYREL) 100 MG tablet Take 100 mg by mouth 3 (three) times daily after meals.     No current facility-administered medications for this visit.      Past Surgical History:  Procedure Laterality Date  . BREAST REDUCTION SURGERY  2002  . CARDIAC CATHETERIZATION N/A 12/22/2015   Procedure: Left Heart Cath and Coronary Angiography;  Surgeon: Peter M Martinique, MD;  Location: [place] [facility];  Service: Cardiovascular;  Laterality: N/A;  . CESAREAN SECTION N/A 04/09/2014   Procedure: CESAREAN SECTION;  Surgeon: Mora Bellman, MD;  Location: [place] [facility];  Service: Obstetrics;  Laterality: N/A;  . CHOLECYSTECTOMY       Allergies  Allergen Reactions  . Diclofenac Swelling    AND POSSIBLE SYNCOPE; tolerates ibuprofen per pt  . Tramadol Nausea And Vomiting    Itching (12/21); tolerates ibuprofen per pt  . Vicodin [Hydrocodone-Acetaminophen] Itching and Nausea Only      Family History  Problem Relation Age of Onset  . Diabetes Mother   . Heart disease Mother   . Hyperlipidemia Paternal Grandfather   . Hypertension Paternal  Grandfather   . Heart disease Father   . Hypertension Father   . Heart disease Maternal Grandmother   . ADD / ADHD Son   . Hypertension Maternal Uncle   . Heart attack Brother   . Sudden death Neg Hx      Social History [name] reports that she has never smoked. She has never used smokeless tobacco. [name] reports that she drinks alcohol.   Review of Systems CONSTITUTIONAL: No weight loss, fever, chills, weakness or fatigue.  HEENT: Eyes: No visual loss, blurred vision,  double vision or yellow sclerae.No hearing loss, sneezing, congestion, runny nose or sore throat.  SKIN: No rash or itching.  CARDIOVASCULAR: per hpi RESPIRATORY: No shortness of breath, cough or sputum.  GASTROINTESTINAL: No anorexia, nausea, vomiting or diarrhea. No abdominal pain or blood.  GENITOURINARY: No burning on urination, no polyuria NEUROLOGICAL: No headache, dizziness, syncope, paralysis, ataxia, numbness or tingling in the extremities. No change in bowel or bladder control.  MUSCULOSKELETAL: No muscle, back pain, joint pain or stiffness.  LYMPHATICS: No enlarged nodes. No history of splenectomy.  PSYCHIATRIC: No history of depression or anxiety.  ENDOCRINOLOGIC: No reports of sweating, cold or heat intolerance. No polyuria or polydipsia.  Marland Kitchen   Physical Examination Vitals:   10/17/17 1014  BP: (!) 188/118  Pulse: 85  SpO2: 97%   Vitals:   10/17/17 1014  Weight: 248 lb (112.5 kg)  Height: 5\' 6"  (1.676 m)    Gen: resting comfortably, no acute distress HEENT: no scleral icterus, pupils equal round and reactive, no palptable cervical adenopathy,  CV Resp: Clear to auscultation bilaterally GI: abdomen is soft, non-tender, non-distended, normal bowel sounds, no hepatosplenomegaly MSK: extremities are warm, no edema.  Skin: warm, no rash Neuro:  no focal deficits Psych: appropriate affect   Diagnostic Studies 10/2014 echo Study Conclusions  - Left ventricle: The cavity size  was normal. There was moderate concentric hypertrophy. Systolic function was moderately reduced. The estimated ejection fraction was in the range of 35% to 40%. Images were suboptimal for LV wall motion assessment. There appeared to be global hypokinesis. Doppler parameters are consistent with abnormal left ventricular relaxation (grade 1 diastolic dysfunction). - Mitral valve: There was trivial regurgitation. - Left atrium: The atrium was mildly dilated.   12/2015 cath  The left ventricular systolic function is normal.  LV end diastolic pressure is mildly elevated.  The left ventricular ejection fraction is 50-55% by visual estimate.  1. No significant CAD 2. Good LV function 3. Mildly elevated LV EDP.   03/2016 cardiac MRI IMPRESSION: 1. Technically difficult study.  2. Normal LV size with moderate LV hypertrophy, EF 35%.  3. Mildly dilated RV with mildly decreased RV systolic function.  4. No myocardial LGE, so no definitive evidence for prior myocardial infarction, infiltrative disease, or myocarditis.   06/2017 echo Study Conclusions  - Left ventricle: The cavity size was normal. Wall thickness was   increased in a pattern of moderate LVH. Systolic function was   moderately reduced. The estimated ejection fraction was in the   range of 35% to 40%. Diffuse hypokinesis. Features are consistent   with a pseudonormal left ventricular filling pattern, with   concomitant abnormal relaxation and increased filling pressure   (grade 2 diastolic dysfunction). Doppler parameters are   consistent with high ventricular filling pressure. - Aortic valve: Mildly calcified annulus. Trileaflet; mildly   thickened leaflets. - Mitral valve: There was mild regurgitation. - Left atrium: The atrium was moderately dilated.  06/2017 Sleep study IMPRESSIONS Moderate obstructive sleep apnea worse during REM sleep is noted during this study.  AutoPap 9-14  Is  recommended.  Assessment and Plan  1.HTN -difficult to control, likely due to her untreated OSA  - increase coreg to 37.5mg  bid, given her BMI this is an acceptable dose - continue other meds. With recent increase in aldactone check labs, pending her K level may need to stop her supplement KCl  2. Chronic systolic heart failure - stable since discharger, continue current meds other than increasing coreg as  described above.  - may add nitrate to her hydral at f/u.   3. OSA - strongly encouraged to go ahead and pick up her CPAP machine - continue to follow with Dr Luan Pulling.    Nursing visit 2 weeks for bp and weight check Labs today, pending K may stop her KCl supplement F/u with provider 6 weeks.    Arnoldo Lenis, M.D.

## 2017-10-17 NOTE — Progress Notes (Addendum)
Primary Care Physician:  Rosita Fire, MD Referring physician: Dr. Zoila Shutter Primary Gastroenterologist:  Barney Drain, MD REVIEWED-NO ADDITIONAL RECOMMENDATIONS.  Chief Complaint  Patient presents with  . Anemia    HPI:  Chelsey Santiago is a 42 y.o. female here at the request of Dr. Ishmael Holter for further evaluation of iron deficiency anemia.  Patient has a history of low ferritin, 11 and 2017, 19 back in December.  She is chronically anemic.  Hemoglobin in the 10 range.  She had iron infusions in December 2018 in January 2019.  Last ferritin was 78 back in February.  She required blood transfusion in 2015 after C-section.  States she is chronically anemic.  She has a history of 6 children.  Her last 2 pregnancies were in 2014 and 2015.  Menstrual cycles are regular.  She has at least 2 days of heavy bleeding per month.  Personal history is also significant for diabetes mellitus, congestive heart failure, sleep apnea and does not wear CPAP, hypertension.  Most recent hospitalization for CHF was last month.  She saw Dr. Harl Bowie in follow-up yesterday, noted to have difficult to control hypertension, suspected to partially be related to untreated sleep apnea, awaiting CPAP machine.  Last echo in February 2019 with LVEF of 35 to 36%, grade 2 diastolic dysfunction.  Generally she has alternating constipation and diarrhea.  Switches every few days.  Chronic symptoms for her.  Denies melena or rectal bleeding.  Denies heartburn.  She has had issues with chest pain which is felt to be acid reflux related.  She was started on Prilosec.  Symptoms always occurred after meals.  She has nausea and solid food dysphagia.  Has used occasional magnesium citrate and castor oil to try to get relief from her constipation.  Takes Percocet but not every day.    Current Outpatient Medications  Medication Sig Dispense Refill  . albuterol (PROVENTIL HFA;VENTOLIN HFA) 108 (90 Base) MCG/ACT inhaler Inhale 1 puff into the  lungs every 6 (six) hours as needed for wheezing or shortness of breath.    Marland Kitchen aspirin EC 81 MG tablet Take 81 mg by mouth daily.    . carvedilol (COREG) 25 MG tablet Take 1.5 tablets (37.5 mg total) by mouth 2 (two) times daily with a meal. 90 tablet 12  . citalopram (CELEXA) 20 MG tablet Take 20 mg by mouth daily.    Marland Kitchen gabapentin (NEURONTIN) 600 MG tablet Take 600 mg by mouth 3 (three) times daily.     . hydrALAZINE (APRESOLINE) 100 MG tablet Take 1 tablet (100 mg total) by mouth 3 (three) times daily. 270 tablet 3  . insulin glargine (LANTUS) 100 UNIT/ML injection Inject 30 Units at bedtime into the skin.     Marland Kitchen insulin lispro (HUMALOG) 100 UNIT/ML injection Inject into the skin 3 (three) times daily before meals.    . Lancets (FREESTYLE) lancets Use as instructed 100 each 12  . meloxicam (MOBIC) 15 MG tablet Take 1 tablet (15 mg total) by mouth daily. 30 tablet 54  . metFORMIN (GLUCOPHAGE) 500 MG tablet Take 500 mg 2 (two) times daily with a meal by mouth.    . mupirocin ointment (BACTROBAN) 2 % Place 1 application into the nose 2 (two) times daily. 22 g 0  . naproxen (NAPROSYN) 500 MG tablet Take 500 mg by mouth 2 (two) times daily with a meal.    . nitroGLYCERIN (NITROSTAT) 0.4 MG SL tablet Place 1 tablet (0.4 mg total) under the tongue every  5 (five) minutes as needed for chest pain. 30 tablet 12  . omeprazole (PRILOSEC) 40 MG capsule Take 40 mg by mouth daily.    . ondansetron (ZOFRAN) 4 MG tablet Take 4 mg by mouth daily as needed for nausea or vomiting.    Marland Kitchen oxyCODONE-acetaminophen (PERCOCET/ROXICET) 5-325 MG tablet Take 1 tablet by mouth every 4 (four) hours as needed for severe pain.    . potassium chloride SA (K-DUR,KLOR-CON) 20 MEQ tablet Take 1 tablet (20 mEq total) by mouth daily. 30 tablet 0  . pyridostigmine (MESTINON) 60 MG tablet Take 60 mg by mouth every 8 (eight) hours.    . sacubitril-valsartan (ENTRESTO) 97-103 MG Take 1 tablet by mouth 2 (two) times daily. 60 tablet 0  .  spironolactone (ALDACTONE) 50 MG tablet Take 1 tablet (50 mg total) by mouth daily. 90 tablet 3  . torsemide (DEMADEX) 20 MG tablet Please take 40 mg oral every morning and 20 mg every evening. (Patient taking differently: Take 20-40 mg 2 (two) times daily by mouth. Please take 40 mg oral every morning and 20 mg every evening.) 90 tablet 0  . traZODone (DESYREL) 100 MG tablet Take 100 mg by mouth 3 (three) times daily after meals.     No current facility-administered medications for this visit.     Allergies as of 10/17/2017 - Review Complete 10/17/2017  Allergen Reaction Noted  . Diclofenac Swelling 07/12/2011  . Tramadol Nausea And Vomiting 05/29/2011  . Vicodin [hydrocodone-acetaminophen] Itching and Nausea Only 05/29/2011    Past Medical History:  Diagnosis Date  . Anemia    H&H of 10.6/33 and 07/2008 and 11.9/35 and 09/2010  . Anxiety   . CHF (congestive heart failure) ([place])   . Depression with anxiety   . Diabetes mellitus without complication ([place])   . Enlarged heart   . Fasting hyperglycemia   . Hypertension    Lab: Normal BMet except glucose of 118 in 09/2010  . Hypertensive heart disease 2009   Pulmonary edema postpartum; mild to moderate mitral regurgitation when hospitalized for CHF in 2009; Echocardiogram in 12/2009-no MR and normal EF; normal CXR in 09/2010  . Migraine headache   . Miscarriage 03/19/2013  . Obesity 04/16/2009  . Osteoarthritis, knee 03/29/2011  . Preeclampsia   . Pregnant   . Pulmonary edema   . Sleep apnea   . Threatened abortion in early pregnancy 03/15/2013    Past Surgical History:  Procedure Laterality Date  . BREAST REDUCTION SURGERY  2002  . CARDIAC CATHETERIZATION N/A 12/22/2015   Procedure: Left Heart Cath and Coronary Angiography;  Surgeon: Peter M Martinique, MD;  Location: [facility];  Service: Cardiovascular;  Laterality: N/A;  . CESAREAN SECTION N/A 04/09/2014   Procedure: CESAREAN SECTION;  Surgeon: Mora Bellman, MD;  Location:  [facility];  Service: Obstetrics;  Laterality: N/A;  . CHOLECYSTECTOMY      Family History  Problem Relation Age of Onset  . Diabetes Mother   . Heart disease Mother   . Hyperlipidemia Paternal Grandfather   . Hypertension Paternal Grandfather   . Heart disease Father   . Hypertension Father   . Heart disease Maternal Grandmother   . ADD / ADHD Son   . Hypertension Maternal Uncle   . Heart attack Brother   . Sudden death Neg Hx     Social History   Socioeconomic History  . Marital status: Married    Spouse name: Not on file  . Number of children: Not on  file  . Years of education: Not on file  . Highest education level: Not on file  Occupational History  . Occupation: unemployed    Fish farm manager: UNEMPLOYED  Social Needs  . Financial resource strain: Not on file  . Food insecurity:    Worry: Not on file    Inability: Not on file  . Transportation needs:    Medical: Not on file    Non-medical: Not on file  Tobacco Use  . Smoking status: Never Smoker  . Smokeless tobacco: Never Used  Substance and Sexual Activity  . Alcohol use: Yes    Comment: occ  . Drug use: No  . Sexual activity: Not Currently    Birth control/protection: Surgical    Comment: tubal  Lifestyle  . Physical activity:    Days per week: Not on file    Minutes per session: Not on file  . Stress: Not on file  Relationships  . Social connections:    Talks on phone: Not on file    Gets together: Not on file    Attends religious service: Not on file    Active member of club or organization: Not on file    Attends meetings of clubs or organizations: Not on file    Relationship status: Not on file  . Intimate partner violence:    Fear of current or ex partner: Not on file    Emotionally abused: Not on file    Physically abused: Not on file    Forced sexual activity: Not on file  Other Topics Concern  . Not on file  Social History Narrative   Lives in [place]   Engaged/boyfriend (father to youngest  chid)   4 children: daughter (59 as of 2013), sons (21, 75, 69 as of 2013)   Religion: christian      ROS:  General: Negative for anorexia, weight loss, fever, chills,   weakness.  Positive weakness Eyes: Negative for vision changes.  ENT: Negative for hoarseness,  nasal congestion.  See HPI CV: Negative for chest pain, angina, palpitations, dyspnea on exertion, peripheral edema.  Respiratory: Negative for dyspnea at rest, dyspnea on exertion, cough, sputum, wheezing.  GI: See history of present illness. GU:  Negative for dysuria, hematuria, urinary incontinence, urinary frequency, nocturnal urination.  MS: Negative for joint pain, low back pain.  Derm: Negative for rash or itching.  Neuro: Negative for weakness, abnormal sensation, seizure, frequent headaches, memory loss, confusion.  Psych: Negative for anxiety, depression, suicidal ideation, hallucinations.  Endo: Negative for unusual weight change.  Heme: Negative for bruising or bleeding. Allergy: Negative for rash or hives.    Physical Examination:  BP (!) 177/104   Pulse 82   Temp (!) 96.9 F (36.1 C) (Oral)   Ht 5\' 6"  (1.676 m)   Wt 248 lb 3.2 oz (112.6 kg)   LMP 10/10/2017 (Approximate)   BMI 40.06 kg/m    General: Well-nourished, well-developed in no acute distress.  Sleeping when I entered the room. Head: Normocephalic, atraumatic.   Eyes: Conjunctiva pink, no icterus. Mouth: Oropharyngeal mucosa moist and pink , no lesions erythema or exudate. Neck: Supple without thyromegaly, masses, or lymphadenopathy.  Lungs: Clear to auscultation bilaterally.  Heart: Regular rate and rhythm, no murmurs rubs or gallops.  Abdomen: Bowel sounds are normal, nontender, nondistended, no hepatosplenomegaly or masses, no abdominal bruits or    hernia , no rebound or guarding.   Rectal: Not performed Extremities: No lower extremity edema. No clubbing or deformities.  Neuro: Alert and oriented x 4 , grossly normal neurologically.   Skin: Warm and dry, no rash or jaundice.   Psych: Alert and cooperative, normal mood and affect.  Labs: Lab Results  Component Value Date   CREATININE 1.04 (H) 10/17/2017   BUN 21 (H) 10/17/2017   NA 135 10/17/2017   K 3.6 10/17/2017   CL 98 (L) 10/17/2017   CO2 27 10/17/2017   Lab Results  Component Value Date   ALT 11 (L) 07/11/2017   AST 11 (L) 07/11/2017   ALKPHOS 67 07/11/2017   BILITOT 0.2 (L) 07/11/2017   Lab Results  Component Value Date   WBC 9.8 09/30/2017   HGB 10.7 (L) 09/30/2017   HCT 35.5 (L) 09/30/2017   MCV 77.7 (L) 09/30/2017   PLT 280 09/30/2017   Lab Results  Component Value Date   IRON 81 07/11/2017   TIBC 339 07/11/2017   FERRITIN 78 07/11/2017     Imaging Studies: Dg Chest 2 View  Result Date: 09/29/2017 CLINICAL DATA:  Short of breath EXAM: CHEST - 2 VIEW COMPARISON:  CT 07/13/2017, radiograph 07/13/2017, 05/27/2017 FINDINGS: Cardiomegaly.  No acute consolidation or effusion.  No pneumothorax. IMPRESSION: No active cardiopulmonary disease.  Cardiomegaly Electronically Signed   By: Donavan Foil M.D.   On: 09/29/2017 00:45   Ct Head Wo Contrast  Result Date: 09/29/2017 CLINICAL DATA:  Bilateral lower extremity swelling.  Headache. EXAM: CT HEAD WITHOUT CONTRAST TECHNIQUE: Contiguous axial images were obtained from the base of the skull through the vertex without intravenous contrast. COMPARISON:  12/08/2015 FINDINGS: Brain: No acute intracranial abnormality. Specifically, no hemorrhage, hydrocephalus, mass lesion, acute infarction, or significant intracranial injury. Vascular: No hyperdense vessel or unexpected calcification. Skull: No acute calvarial abnormality. Sinuses/Orbits: Visualized paranasal sinuses and mastoids clear. Orbital soft tissues unremarkable. Other: None IMPRESSION: Unremarkable study. Electronically Signed   By: Rolm Baptise M.D.   On: 09/29/2017 09:48

## 2017-10-18 ENCOUNTER — Telehealth: Payer: Self-pay

## 2017-10-18 ENCOUNTER — Encounter: Payer: Self-pay | Admitting: Gastroenterology

## 2017-10-18 ENCOUNTER — Telehealth: Payer: Self-pay | Admitting: Gastroenterology

## 2017-10-18 NOTE — Telephone Encounter (Signed)
Called pt. Non answer, left message for pt to return call.  

## 2017-10-18 NOTE — Assessment & Plan Note (Signed)
Iron deficiency anemia requiring iron infusions, last one in January.  Next labs due in July by her hematologist.  Hemoccult status unknown.  Blood loss may be secondary to menses iron deficiency may in part be due to menstrual losses however would consider colonoscopy with possible upper endoscopy to exclude GI losses.  Patient has alternating constipation/diarrhea, uses NSAIDs, has had somewhat refractory GERD symptoms as well as solid food dysphagia.  We will request cardiac clearance from Dr. Harl Bowie for possible colonoscopy colonoscopy with possible EGD/dilation in the near future.  I have discussed the risks, alternatives, benefits with regards to but not limited to the risk of reaction to medication, bleeding, infection, perforation and the patient is agreeable to proceed. Written consent to be obtained.

## 2017-10-18 NOTE — Assessment & Plan Note (Signed)
More predominance of constipation lately, utilizing magnesium citrate and castor all at times.  Try Amitiza 8 mcg take 1-2 times daily.  She will let me know how she does.  We initially plan to give her 24 mcg tablets but we did not have any samples.

## 2017-10-18 NOTE — Telephone Encounter (Signed)
-----   Message from Arnoldo Lenis, MD sent at 10/18/2017  3:27 PM EDT ----- Labs show low normal potassium. Verify she is taking potassium 36mEq at home, I would continue. Mild decrease in renal function but not significant enough to change meds at this time. Repeat BMET/Mg in 6 weeks  J BrancH MD

## 2017-10-18 NOTE — Telephone Encounter (Signed)
From cardiac standpoint ok to proceed with GI procedures   Zandra Abts MD

## 2017-10-18 NOTE — Telephone Encounter (Signed)
Patient with iron deficiency anemia, GERD, esophageal dysphagia.  We are planning on colonoscopy/upper endoscopy for further evaluation.  Given recent hospitalization for exacerbation of her congestive heart failure, would like cardiology input regarding readiness to undergo these procedures.

## 2017-10-19 NOTE — Progress Notes (Signed)
cc'ed to pcp °

## 2017-10-19 NOTE — Telephone Encounter (Signed)
Please schedule TCS/EGD/ED with propofol with [facility].   Day before: Lantus 15 units at bedtime, Humalog 1/2 dose, metformin 1/2 tab bid,  AM of: HOLD Lantus, Humalog, metformin  Dx: IDA, solid food dysphagia, nausea, GERD

## 2017-10-20 NOTE — Telephone Encounter (Signed)
LMOVM

## 2017-10-21 NOTE — Telephone Encounter (Signed)
LMOVM. Will send letter to patient for her to call

## 2017-10-31 ENCOUNTER — Ambulatory Visit: Payer: Medicaid Other

## 2017-11-04 ENCOUNTER — Ambulatory Visit: Payer: Medicaid Other | Admitting: Cardiology

## 2017-11-22 ENCOUNTER — Other Ambulatory Visit (HOSPITAL_COMMUNITY): Payer: Medicaid Other

## 2017-11-29 ENCOUNTER — Ambulatory Visit (HOSPITAL_COMMUNITY): Payer: Medicaid Other | Admitting: Internal Medicine

## 2017-12-05 NOTE — Progress Notes (Signed)
Cardiology Office Note    Date:  12/06/2017   ID:  Chelsey Santiago, DOB 1976-03-31, MRN [id number]  PCP:  Rosita Fire, MD  Cardiologist: Chelsey Dolly, MD    Chief Complaint  Patient presents with  . Follow-up    6 week visit    History of Present Illness:    Chelsey Santiago is a 42 y.o. female with past medical history of chronic combined systolic and diastolic CHF (EF 67-34% by echo in 12/2015 with cath showing normal cors), nonischemic cardiomyopathy, HTN, and OSA who presents to the office today for 6-week follow-up.   She was last examined by Chelsey Santiago in 10/2017 following a recent admission in 09/2017 for a CHF exacerbation. She reported having intermittent dyspnea at the time of her visit and had not been following daily weights. Weight was stable at 248 lbs but BP was elevated to 188/118. Therefore, Coreg was further titrated to 37.5mg  BID and she was continued on her current dosing of Entresto, Spironolactone, Hydralazine, and Torsemide.   In talking with the patient today, she does report having baseline dyspnea on exertion but denies any acute changes in this. Reports having episodes of chest discomfort over the past 2 weeks which has occurred in the setting of increased stress at home. She is the primary caregiver for her 4 children who are still at home (has 2 adult children as well). Reports episodes of pain typically last for only a few seconds up to a minute and spontaneously resolve. Can occur at rest or with activity. Reports weight has overall been stable on her home scales.  Denies any recent orthopnea, PND, or lower extremity edema.  She does not follow blood pressure regularly at home but it is elevated at 168/94 during today's visit.    Past Medical History:  Diagnosis Date  . Anemia    H&H of 10.6/33 and 07/2008 and 11.9/35 and 09/2010  . Anxiety   . CHF (congestive heart failure) ([place])    a. EF 40-45% by echo in 12/2015 with cath showing normal cors    . Depression with anxiety   . Diabetes mellitus without complication ([place])   . Enlarged heart   . Hypertension   . Hypertensive heart disease 2009   Pulmonary edema postpartum; mild to moderate mitral regurgitation when hospitalized for CHF in 2009; Echocardiogram in 12/2009-no MR and normal EF; normal CXR in 09/2010  . Migraine headache   . Miscarriage 03/19/2013  . Obesity 04/16/2009  . Osteoarthritis, knee 03/29/2011  . Preeclampsia   . Pregnant   . Pulmonary edema   . Sleep apnea   . Threatened abortion in early pregnancy 03/15/2013    Past Surgical History:  Procedure Laterality Date  . BREAST REDUCTION SURGERY  2002  . CARDIAC CATHETERIZATION N/A 12/22/2015   Procedure: Left Heart Cath and Coronary Angiography;  Surgeon: Peter M Martinique, MD;  Location: [facility];  Service: Cardiovascular;  Laterality: N/A;  . CESAREAN SECTION N/A 04/09/2014   Procedure: CESAREAN SECTION;  Surgeon: Mora Bellman, MD;  Location: [facility];  Service: Obstetrics;  Laterality: N/A;  . CHOLECYSTECTOMY      Current Medications: Outpatient Medications Prior to Visit  Medication Sig Dispense Refill  . albuterol (PROVENTIL HFA;VENTOLIN HFA) 108 (90 Base) MCG/ACT inhaler Inhale 1 puff into the lungs every 6 (six) hours as needed for wheezing or shortness of breath.    Marland Kitchen aspirin EC 81 MG tablet Take 81 mg by mouth daily.    Marland Kitchen  citalopram (CELEXA) 20 MG tablet Take 20 mg by mouth daily.    Marland Kitchen gabapentin (NEURONTIN) 600 MG tablet Take 600 mg by mouth 3 (three) times daily.     . hydrALAZINE (APRESOLINE) 100 MG tablet Take 1 tablet (100 mg total) by mouth 3 (three) times daily. 270 tablet 3  . insulin glargine (LANTUS) 100 UNIT/ML injection Inject 30 Units at bedtime into the skin.     Marland Kitchen insulin lispro (HUMALOG) 100 UNIT/ML injection Inject into the skin 3 (three) times daily before meals.    . Lancets (FREESTYLE) lancets Use as instructed 100 each 12  . lubiprostone (AMITIZA) 24 MCG capsule Take 1  capsule (24 mcg total) by mouth 2 (two) times daily with a meal. 10 capsule 0  . meloxicam (MOBIC) 15 MG tablet Take 1 tablet (15 mg total) by mouth daily. 30 tablet 54  . metFORMIN (GLUCOPHAGE) 500 MG tablet Take 500 mg 2 (two) times daily with a meal by mouth.    . mupirocin ointment (BACTROBAN) 2 % Place 1 application into the nose 2 (two) times daily. 22 g 0  . naproxen (NAPROSYN) 500 MG tablet Take 500 mg by mouth 2 (two) times daily with a meal.    . nitroGLYCERIN (NITROSTAT) 0.4 MG SL tablet Place 1 tablet (0.4 mg total) under the tongue every 5 (five) minutes as needed for chest pain. 30 tablet 12  . omeprazole (PRILOSEC) 40 MG capsule Take 40 mg by mouth daily.    . ondansetron (ZOFRAN) 4 MG tablet Take 4 mg by mouth daily as needed for nausea or vomiting.    Marland Kitchen oxyCODONE-acetaminophen (PERCOCET/ROXICET) 5-325 MG tablet Take 1 tablet by mouth every 4 (four) hours as needed for severe pain.    . potassium chloride SA (K-DUR,KLOR-CON) 20 MEQ tablet Take 1 tablet (20 mEq total) by mouth daily. 30 tablet 0  . pyridostigmine (MESTINON) 60 MG tablet Take 60 mg by mouth every 8 (eight) hours.    . sacubitril-valsartan (ENTRESTO) 97-103 MG Take 1 tablet by mouth 2 (two) times daily. 60 tablet 0  . spironolactone (ALDACTONE) 50 MG tablet Take 1 tablet (50 mg total) by mouth daily. 90 tablet 3  . torsemide (DEMADEX) 20 MG tablet Please take 40 mg oral every morning and 20 mg every evening. (Patient taking differently: Take 20-40 mg 2 (two) times daily by mouth. Please take 40 mg oral every morning and 20 mg every evening.) 90 tablet 0  . traZODone (DESYREL) 100 MG tablet Take 100 mg by mouth 3 (three) times daily after meals.    . carvedilol (COREG) 25 MG tablet Take 1.5 tablets (37.5 mg total) by mouth 2 (two) times daily with a meal. 90 tablet 12   No facility-administered medications prior to visit.      Allergies:   Diclofenac; Tramadol; and Vicodin [hydrocodone-acetaminophen]   Social  History   Socioeconomic History  . Marital status: Married    Spouse name: Not on file  . Number of children: Not on file  . Years of education: Not on file  . Highest education level: Not on file  Occupational History  . Occupation: unemployed    Fish farm manager: UNEMPLOYED  Social Needs  . Financial resource strain: Not on file  . Food insecurity:    Worry: Not on file    Inability: Not on file  . Transportation needs:    Medical: Not on file    Non-medical: Not on file  Tobacco Use  . Smoking status: Never  Smoker  . Smokeless tobacco: Never Used  Substance and Sexual Activity  . Alcohol use: Yes    Comment: occ  . Drug use: No  . Sexual activity: Not Currently    Birth control/protection: Surgical    Comment: tubal  Lifestyle  . Physical activity:    Days per week: Not on file    Minutes per session: Not on file  . Stress: Not on file  Relationships  . Social connections:    Talks on phone: Not on file    Gets together: Not on file    Attends religious service: Not on file    Active member of club or organization: Not on file    Attends meetings of clubs or organizations: Not on file    Relationship status: Not on file  Other Topics Concern  . Not on file  Social History Narrative   Lives in [place]   Engaged/boyfriend (father to youngest chid)   4 children: daughter (15 as of 2013), sons (15, 105, 61 as of 2013)   Religion: christian     Family History:  The patient's family history includes ADD / ADHD in her son; Diabetes (age of onset: 52) in her mother; Heart attack in her brother; Heart disease in her father and mother; Heart disease (age of onset: 66) in her maternal grandmother; Hyperlipidemia in her paternal grandfather; Hypertension in her father, maternal uncle, and paternal grandfather.   Review of Systems:   Please see the history of present illness.     General:  No chills, fever, night sweats or weight changes.  Cardiovascular:  No edema, orthopnea,  palpitations, paroxysmal nocturnal dyspnea. Positive for chest pain and dyspnea on exertion.  Dermatological: No rash, lesions/masses Respiratory: No cough, dyspnea Urologic: No hematuria, dysuria Abdominal:   No nausea, vomiting, diarrhea, bright red blood per rectum, melena, or hematemesis Neurologic:  No visual changes, wkns, changes in mental status. All other systems reviewed and are otherwise negative except as noted above.   Physical Exam:    VS:  BP (!) 162/84   Pulse 100   Ht 5\' 6"  (1.676 m)   Wt 245 lb (111.1 kg)   SpO2 98%   BMI 39.54 kg/m    General: Well developed, well nourished [place] American female appearing in no acute distress. Head: Normocephalic, atraumatic, sclera non-icteric, no xanthomas, nares are without discharge.  Neck: No carotid bruits. JVD not elevated.  Lungs: Respirations regular and unlabored, without wheezes or rales.  Heart: Regular rate and rhythm. No S3 or S4.  No murmur, no rubs, or gallops appreciated. Abdomen: Soft, non-tender, non-distended with normoactive bowel sounds. No hepatomegaly. No rebound/guarding. No obvious abdominal masses. Msk:  Strength and tone appear normal for age. No joint deformities or effusions. Extremities: No clubbing or cyanosis. No lower extremity edema.  Distal pedal pulses are 2+ bilaterally. Neuro: Alert and oriented X 3. Moves all extremities spontaneously. No focal deficits noted. Psych:  Responds to questions appropriately with a normal affect. Skin: No rashes or lesions noted  Wt Readings from Last 3 Encounters:  12/06/17 245 lb (111.1 kg)  10/17/17 248 lb 3.2 oz (112.6 kg)  10/17/17 248 lb (112.5 kg)     Studies/Labs Reviewed:   EKG:  EKG is ordered today. The ekg ordered today demonstrates NSR, HR 97, with T-wave abnormalities along lateral leads (similar to prior tracings).   Recent Labs: 03/25/2017: TSH 0.534 07/11/2017: ALT 11 09/29/2017: B Natriuretic Peptide 683.5 09/30/2017: Hemoglobin 10.7;  Platelets 280  10/17/2017: BUN 21; Creatinine, Ser 1.04; Magnesium 1.6; Potassium 3.6; Sodium 135   Lipid Panel    Component Value Date/Time   CHOL 154 11/20/2004   TRIG 225 11/20/2004   HDL 38 11/20/2004   LDLCALC 71 11/20/2004    Additional studies/ records that were reviewed today include:   Echocardiogram: 07/11/2017 Study Conclusions  - Left ventricle: The cavity size was normal. Wall thickness was   increased in a pattern of moderate LVH. Systolic function was   moderately reduced. The estimated ejection fraction was in the   range of 35% to 40%. Diffuse hypokinesis. Features are consistent   with a pseudonormal left ventricular filling pattern, with   concomitant abnormal relaxation and increased filling pressure   (grade 2 diastolic dysfunction). Doppler parameters are   consistent with high ventricular filling pressure. - Aortic valve: Mildly calcified annulus. Trileaflet; mildly   thickened leaflets. - Mitral valve: There was mild regurgitation. - Left atrium: The atrium was moderately dilated.  Cardiac MRI: 03/2016 IMPRESSION: 1.  Technically difficult study.  2.  Normal LV size with moderate LV hypertrophy, EF 35%.  3.  Mildly dilated RV with mildly decreased RV systolic function.  4. No myocardial LGE, so no definitive evidence for prior myocardial infarction, infiltrative disease, or myocarditis.  Cardiac Catheterization: 12/2015  The left ventricular systolic function is normal.  LV end diastolic pressure is mildly elevated.  The left ventricular ejection fraction is 50-55% by visual estimate.   1. No significant CAD 2. Good LV function 3. Mildly elevated LV EDP.  Plan: medical therapy  Assessment:    1. Chronic combined systolic and diastolic heart failure ([place])   2. Nonischemic cardiomyopathy (H[facility])   3. Chest pain, unspecified type   4. Essential hypertension   5. OSA (obstructive sleep apnea)      Plan:   In order of problems  listed above:  1. Chronic Combined Systolic and Diastolic CHF/ Nonischemic Cardiomyopathy - EF 40-45% by echo in 12/2015 with cath showing normal cors at that time. Repeat echocardiogram in 06/2017 showed her EF was further reduced at 35 to 40%. - She has baseline dyspnea on exertion but denies any recent orthopnea, PND, or lower extremity edema.  Appears euvolemic by examination today. - Will continue on Carvedilol, Entresto, Spironolactone, Hydralazine, and Torsemide with plans to titrate Carvedilol to 50 mg twice daily as outlined below.  2. Atypical Chest Pain - She reports having episodes of chest discomfort over the past few weeks which can last for seconds at a time and is not associated with activity. Has been under more stress at home as she is the primary caregiver for 4 of her children. - A repeat EKG was obtained today and shows no acute ST changes when compared to prior tracings. No plans for further ischemic testing at this time in the setting of her chest pain being atypical for a cardiac etiology and recent catheterization less than 2 years ago showing normal coronary arteries.  3. HTN - BP initially elevated to 168/94, slightly improved to 162/84 on recheck. She reports having taken her medications this morning. Will plan to further titrate Carvedilol to 50 mg twice daily (max dosing with her current weight) in the setting of her elevated BP and HR at 100 bpm.  - Continue Hydralazine, Entresto, and Spironolactone at current dosing. Consider the addition of Imdur or Amlodipine if BP remains above goal.   4. OSA - Just recently started on CPAP. Being followed by Pulmonology.  Medication Adjustments/Labs and Tests Ordered: Current medicines are reviewed at length with the patient today.  Concerns regarding medicines are outlined above.  Medication changes, Labs and Tests ordered today are listed in the Patient Instructions below. Patient Instructions  Your physician wants you to  follow-up in: 2 months [place] to 50 mg twice a day  If you need a refill on your cardiac medications before your next appointment, please call your pharmacy.  No labs, or tests today  Thank you for choosing [facility] !         Signed, Erma Heritage, PA-C  12/06/2017 4:58 PM    [facility] [address] [place], [place] [address] Phone: [phone]

## 2017-12-06 ENCOUNTER — Encounter: Payer: Self-pay | Admitting: Student

## 2017-12-06 ENCOUNTER — Ambulatory Visit (INDEPENDENT_AMBULATORY_CARE_PROVIDER_SITE_OTHER): Payer: Medicaid Other | Admitting: Student

## 2017-12-06 VITALS — BP 162/84 | HR 100 | Ht 66.0 in | Wt 245.0 lb

## 2017-12-06 DIAGNOSIS — I5042 Chronic combined systolic (congestive) and diastolic (congestive) heart failure: Secondary | ICD-10-CM

## 2017-12-06 DIAGNOSIS — I428 Other cardiomyopathies: Secondary | ICD-10-CM | POA: Diagnosis not present

## 2017-12-06 DIAGNOSIS — G4733 Obstructive sleep apnea (adult) (pediatric): Secondary | ICD-10-CM

## 2017-12-06 DIAGNOSIS — I10 Essential (primary) hypertension: Secondary | ICD-10-CM | POA: Diagnosis not present

## 2017-12-06 DIAGNOSIS — R079 Chest pain, unspecified: Secondary | ICD-10-CM | POA: Diagnosis not present

## 2017-12-06 MED ORDER — CARVEDILOL 25 MG PO TABS: 50.0000 mg | ORAL_TABLET | Freq: Two times a day (BID) | ORAL | 3 refills | Status: DC

## 2017-12-06 NOTE — Patient Instructions (Signed)
Your physician wants you to follow-up in: 2 months [place] to 50 mg twice a day   If you need a refill on your cardiac medications before your next appointment, please call your pharmacy.   No labs, or tests today     Thank you for choosing [facility] !

## 2017-12-29 ENCOUNTER — Ambulatory Visit: Payer: Medicaid Other | Admitting: Orthopaedic Surgery

## 2017-12-29 ENCOUNTER — Encounter: Payer: Self-pay | Admitting: Orthopaedic Surgery

## 2017-12-29 ENCOUNTER — Ambulatory Visit (INDEPENDENT_AMBULATORY_CARE_PROVIDER_SITE_OTHER): Payer: Medicaid Other

## 2017-12-29 VITALS — BP 193/124 | HR 84 | Ht 66.0 in | Wt 240.0 lb

## 2017-12-29 DIAGNOSIS — M25562 Pain in left knee: Secondary | ICD-10-CM | POA: Diagnosis not present

## 2017-12-29 DIAGNOSIS — G8929 Other chronic pain: Secondary | ICD-10-CM

## 2017-12-29 MED ORDER — HYDROCODONE-ACETAMINOPHEN 5-325 MG PO TABS: ORAL_TABLET | ORAL | 0 refills | Status: DC

## 2017-12-29 NOTE — Progress Notes (Signed)
Patient Chelsey Santiago, female DOB:14-Jul-1975, 42 y.o. AVW:[id number]  Chief Complaint  Patient presents with  . Knee Pain    bilateral    HPI  Chelsey Santiago is a 42 y.o. female who has bilateral knee pain, she has more pain on the left.  The left knee swells, locks and gives way.  The locking started about a week to 10 days ago.  She falls when it locks.  She is able to get it moving in a few seconds.  She has no trauma. She has popping.  Her right knee is tender and hurts but the left knee is more of a problem.     Body mass index is 38.74 kg/m.  ROS  Review of Systems  HENT: Negative for congestion.   Respiratory: Negative for cough and shortness of breath.   Cardiovascular: Negative for chest pain and leg swelling.  Endocrine: Positive for cold intolerance.  Musculoskeletal: Positive for arthralgias, gait problem and joint swelling.  Allergic/Immunologic: Positive for environmental allergies.  Neurological: Positive for headaches.  Psychiatric/Behavioral: The patient is nervous/anxious.   All other systems reviewed and are negative.   All other systems reviewed and are negative.  Past Medical History:  Diagnosis Date  . Anemia    H&H of 10.6/33 and 07/2008 and 11.9/35 and 09/2010  . Anxiety   . CHF (congestive heart failure) ([place])    a. EF 40-45% by echo in 12/2015 with cath showing normal cors  . Depression with anxiety   . Diabetes mellitus without complication ([place])   . Enlarged heart   . Hypertension   . Hypertensive heart disease 2009   Pulmonary edema postpartum; mild to moderate mitral regurgitation when hospitalized for CHF in 2009; Echocardiogram in 12/2009-no MR and normal EF; normal CXR in 09/2010  . Migraine headache   . Miscarriage 03/19/2013  . Obesity 04/16/2009  . Osteoarthritis, knee 03/29/2011  . Preeclampsia   . Pregnant   . Pulmonary edema   . Sleep apnea   . Threatened abortion in early pregnancy 03/15/2013    Past Surgical History:   Procedure Laterality Date  . BREAST REDUCTION SURGERY  2002  . CARDIAC CATHETERIZATION N/A 12/22/2015   Procedure: Left Heart Cath and Coronary Angiography;  Surgeon: Peter M Martinique, MD;  Location: [facility];  Service: Cardiovascular;  Laterality: N/A;  . CESAREAN SECTION N/A 04/09/2014   Procedure: CESAREAN SECTION;  Surgeon: Mora Bellman, MD;  Location: [facility];  Service: Obstetrics;  Laterality: N/A;  . CHOLECYSTECTOMY      Family History  Problem Relation Age of Onset  . Diabetes Mother 51  . Heart disease Mother   . Hyperlipidemia Paternal Grandfather   . Hypertension Paternal Grandfather   . Heart disease Father   . Hypertension Father   . Heart disease Maternal Grandmother 60  . ADD / ADHD Son   . Hypertension Maternal Uncle   . Heart attack Brother   . Sudden death Neg Hx   . Colon cancer Neg Hx   . Celiac disease Neg Hx   . Inflammatory bowel disease Neg Hx     Social History Social History   Tobacco Use  . Smoking status: Never Smoker  . Smokeless tobacco: Never Used  Substance Use Topics  . Alcohol use: Yes    Comment: occ  . Drug use: No    Allergies  Allergen Reactions  . Diclofenac Swelling    AND POSSIBLE SYNCOPE; tolerates ibuprofen per pt  . Tramadol  Nausea And Vomiting    Itching (12/21); tolerates ibuprofen per pt  . Vicodin [Hydrocodone-Acetaminophen] Itching and Nausea Only    Current Outpatient Medications  Medication Sig Dispense Refill  . albuterol (PROVENTIL HFA;VENTOLIN HFA) 108 (90 Base) MCG/ACT inhaler Inhale 1 puff into the lungs every 6 (six) hours as needed for wheezing or shortness of breath.    Marland Kitchen aspirin EC 81 MG tablet Take 81 mg by mouth daily.    . carvedilol (COREG) 25 MG tablet Take 2 tablets (50 mg total) by mouth 2 (two) times daily with a meal. 360 tablet 3  . citalopram (CELEXA) 20 MG tablet Take 20 mg by mouth daily.    Marland Kitchen gabapentin (NEURONTIN) 600 MG tablet Take 600 mg by mouth 3 (three) times daily.     .  hydrALAZINE (APRESOLINE) 100 MG tablet Take 1 tablet (100 mg total) by mouth 3 (three) times daily. 270 tablet 3  . insulin glargine (LANTUS) 100 UNIT/ML injection Inject 30 Units at bedtime into the skin.     Marland Kitchen insulin lispro (HUMALOG) 100 UNIT/ML injection Inject into the skin 3 (three) times daily before meals.    . Lancets (FREESTYLE) lancets Use as instructed 100 each 12  . lubiprostone (AMITIZA) 24 MCG capsule Take 1 capsule (24 mcg total) by mouth 2 (two) times daily with a meal. 10 capsule 0  . meloxicam (MOBIC) 15 MG tablet Take 1 tablet (15 mg total) by mouth daily. 30 tablet 54  . metFORMIN (GLUCOPHAGE) 500 MG tablet Take 500 mg 2 (two) times daily with a meal by mouth.    . mupirocin ointment (BACTROBAN) 2 % Place 1 application into the nose 2 (two) times daily. 22 g 0  . naproxen (NAPROSYN) 500 MG tablet Take 500 mg by mouth 2 (two) times daily with a meal.    . nitroGLYCERIN (NITROSTAT) 0.4 MG SL tablet Place 1 tablet (0.4 mg total) under the tongue every 5 (five) minutes as needed for chest pain. 30 tablet 12  . omeprazole (PRILOSEC) 40 MG capsule Take 40 mg by mouth daily.    . ondansetron (ZOFRAN) 4 MG tablet Take 4 mg by mouth daily as needed for nausea or vomiting.    Marland Kitchen oxyCODONE-acetaminophen (PERCOCET/ROXICET) 5-325 MG tablet Take 1 tablet by mouth every 4 (four) hours as needed for severe pain.    . potassium chloride SA (K-DUR,KLOR-CON) 20 MEQ tablet Take 1 tablet (20 mEq total) by mouth daily. 30 tablet 0  . pyridostigmine (MESTINON) 60 MG tablet Take 60 mg by mouth every 8 (eight) hours.    . sacubitril-valsartan (ENTRESTO) 97-103 MG Take 1 tablet by mouth 2 (two) times daily. 60 tablet 0  . spironolactone (ALDACTONE) 50 MG tablet Take 1 tablet (50 mg total) by mouth daily. 90 tablet 3  . torsemide (DEMADEX) 20 MG tablet Please take 40 mg oral every morning and 20 mg every evening. (Patient taking differently: Take 20-40 mg 2 (two) times daily by mouth. Please take 40 mg  oral every morning and 20 mg every evening.) 90 tablet 0  . traZODone (DESYREL) 100 MG tablet Take 100 mg by mouth 3 (three) times daily after meals.     No current facility-administered medications for this visit.      Physical Exam  Blood pressure (!) 193/124, pulse 84, height 5\' 6"  (1.676 m), weight 240 lb (108.9 kg), last menstrual period 12/17/2017.  Constitutional: overall normal hygiene, normal nutrition, well developed, normal grooming, normal body habitus. Assistive device:none  Musculoskeletal: gait and station Limp left, muscle tone and strength are normal, no tremors or atrophy is present.  .  Neurological: coordination overall normal.  Deep tendon reflex/nerve stretch intact.  Sensation normal.  Cranial nerves II-XII intact.   Skin:   Normal overall no scars, lesions, ulcers or rashes. No psoriasis.  Psychiatric: Alert and oriented x 3.  Recent memory intact, remote memory unclear.  Normal mood and affect. Well groomed.  Good eye contact.  Cardiovascular: overall no swelling, no varicosities, no edema bilaterally, normal temperatures of the legs and arms, no clubbing, cyanosis and good capillary refill.  Lymphatic: palpation is normal.  The left lower extremity is examined:  Inspection:  Thigh:  Non-tender and no defects  Knee has swelling 1+ effusion.                        Joint tenderness is present                        Patient is tender over the medial joint line  Lower Leg:  Has normal appearance and no tenderness or defects  Ankle:  Non-tender and no defects  Foot:  Non-tender and no defects Range of Motion:  Knee:  Range of motion is: 0-105                        Crepitus is  present  Ankle:  Range of motion is normal. Strength and Tone:  The left lower extremity has normal strength and tone. Stability:  Knee:  The knee has positive medial [place].  Ankle:  The ankle is stable.   All other systems reviewed and are negative   The patient has been  educated about the nature of the problem(s) and counseled on treatment options.  The patient appeared to understand what I have discussed and is in agreement with it.  Encounter Diagnosis  Name Primary?  . Acute pain of left knee Yes    PLAN Call if any problems.  Precautions discussed.  Continue current medications.   Return to clinic after MRI of the left knee  I am concerned about a meniscus tear.  She will most likely need surgery.  I have scheduled the MRI.  I have reviewed the [facility] web site prior to prescribing narcotic medicine for this patient.  She asked for Percocet saying Norco caused her to itch.  Both are similar codeine preparations.  I have called in [place] as she has taken this in the past.  Electronically Signed Sanjuana Kava, MD 8/15/20199:57 AM

## 2018-01-05 ENCOUNTER — Telehealth: Payer: Self-pay | Admitting: Radiology

## 2018-01-05 NOTE — Telephone Encounter (Signed)
Got the approval for the MRI via fax, patient is sch for Chelsey Santiago on 01/12/18 at 2 pm I have called to advise left message for her to call back

## 2018-01-09 ENCOUNTER — Other Ambulatory Visit: Payer: Self-pay | Admitting: Orthopaedic Surgery

## 2018-01-09 NOTE — Telephone Encounter (Signed)
Spoke to patient, advised and made follow up

## 2018-01-09 NOTE — Telephone Encounter (Signed)
I made follow up with the wrong doctor. This is Dr Luna Glasgow not Dr Aline Brochure. I have called patient to RS, she needs a follow up for after the MRI on 01/12/18  Left message for patient to call back and make a follow up appt. With Dr Luna Glasgow

## 2018-01-12 ENCOUNTER — Ambulatory Visit (HOSPITAL_COMMUNITY)
Admission: RE | Admit: 2018-01-12 | Discharge: 2018-01-12 | Disposition: A | Discharge status: Home / Self Care | Payer: Medicaid Other | Source: Ambulatory Visit | Attending: Orthopaedic Surgery | Admitting: Orthopaedic Surgery

## 2018-01-12 DIAGNOSIS — M25562 Pain in left knee: Secondary | ICD-10-CM | POA: Diagnosis present

## 2018-01-12 DIAGNOSIS — G8929 Other chronic pain: Secondary | ICD-10-CM | POA: Diagnosis not present

## 2018-01-12 DIAGNOSIS — M1712 Unilateral primary osteoarthritis, left knee: Secondary | ICD-10-CM | POA: Diagnosis not present

## 2018-01-18 ENCOUNTER — Ambulatory Visit: Payer: Medicaid Other | Admitting: Orthopaedic Surgery

## 2018-01-19 ENCOUNTER — Encounter: Payer: Self-pay | Admitting: Orthopaedic Surgery

## 2018-01-19 ENCOUNTER — Ambulatory Visit: Payer: Medicaid Other | Admitting: Orthopaedic Surgery

## 2018-01-19 VITALS — BP 185/110 | HR 85 | Ht 66.0 in | Wt 235.0 lb

## 2018-01-19 DIAGNOSIS — M25562 Pain in left knee: Secondary | ICD-10-CM

## 2018-01-19 DIAGNOSIS — G8929 Other chronic pain: Secondary | ICD-10-CM | POA: Diagnosis not present

## 2018-01-19 NOTE — Progress Notes (Signed)
Patient Chelsey Santiago, female DOB:[birth year], 42 y.o. INO:[id number]  Chief Complaint  Patient presents with  . Knee Pain    MRI left knee results    HPI  Chelsey Santiago is a 42 y.o. female who has left knee pain.  She had a MRI done and it showed: IMPRESSION: Negative for meniscal or ligament tear.  Advanced for age tricompartmental osteoarthritis is worst in the medial compartment.  I have explained the findings to her.  I will inject the knee today.  She is agreeable to this.   Body mass index is 37.93 kg/m.  ROS  Review of Systems  All other systems reviewed and are negative.  The following is a summary of the past history medically, past history surgically, known current medicines, social history and family history.  This information is gathered electronically by the computer from prior information and documentation.  I review this each visit and have found including this information at this point in the chart is beneficial and informative.    Past Medical History:  Diagnosis Date  . Anemia    H&H of 10.6/33 and 07/2008 and 11.9/35 and 09/2010  . Anxiety   . CHF (congestive heart failure) ([place])    a. EF 40-45% by echo in 12/2015 with cath showing normal cors  . Depression with anxiety   . Diabetes mellitus without complication ([place])   . Enlarged heart   . Hypertension   . Hypertensive heart disease 2009   Pulmonary edema postpartum; mild to moderate mitral regurgitation when hospitalized for CHF in 2009; Echocardiogram in 12/2009-no MR and normal EF; normal CXR in 09/2010  . Migraine headache   . Miscarriage 03/19/2013  . Obesity 04/16/2009  . Osteoarthritis, knee 03/29/2011  . Preeclampsia   . Pregnant   . Pulmonary edema   . Sleep apnea   . Threatened abortion in early pregnancy 03/15/2013    Past Surgical History:  Procedure Laterality Date  . BREAST REDUCTION SURGERY  2002  . CARDIAC CATHETERIZATION N/A 12/22/2015   Procedure: Left Heart Cath and  Coronary Angiography;  Surgeon: Peter M Martinique, MD;  Location: [facility];  Service: Cardiovascular;  Laterality: N/A;  . CESAREAN SECTION N/A 04/09/2014   Procedure: CESAREAN SECTION;  Surgeon: Mora Bellman, MD;  Location: [facility];  Service: Obstetrics;  Laterality: N/A;  . CHOLECYSTECTOMY      Family History  Problem Relation Age of Onset  . Diabetes Mother 75  . Heart disease Mother   . Hyperlipidemia Paternal Grandfather   . Hypertension Paternal Grandfather   . Heart disease Father   . Hypertension Father   . Heart disease Maternal Grandmother 60  . ADD / ADHD Son   . Hypertension Maternal Uncle   . Heart attack Brother   . Sudden death Neg Hx   . Colon cancer Neg Hx   . Celiac disease Neg Hx   . Inflammatory bowel disease Neg Hx     Social History Social History   Tobacco Use  . Smoking status: Never Smoker  . Smokeless tobacco: Never Used  Substance Use Topics  . Alcohol use: Yes    Comment: occ  . Drug use: No    Allergies  Allergen Reactions  . Diclofenac Swelling    AND POSSIBLE SYNCOPE; tolerates ibuprofen per pt  . Tramadol Nausea And Vomiting    Itching (12/21); tolerates ibuprofen per pt  . Vicodin [Hydrocodone-Acetaminophen] Itching and Nausea Only    Current Outpatient Medications  Medication  Sig Dispense Refill  . albuterol (PROVENTIL HFA;VENTOLIN HFA) 108 (90 Base) MCG/ACT inhaler Inhale 1 puff into the lungs every 6 (six) hours as needed for wheezing or shortness of breath.    Marland Kitchen aspirin EC 81 MG tablet Take 81 mg by mouth daily.    . carvedilol (COREG) 25 MG tablet Take 2 tablets (50 mg total) by mouth 2 (two) times daily with a meal. 360 tablet 3  . citalopram (CELEXA) 20 MG tablet Take 20 mg by mouth daily.    Marland Kitchen gabapentin (NEURONTIN) 600 MG tablet Take 600 mg by mouth 3 (three) times daily.     . hydrALAZINE (APRESOLINE) 100 MG tablet Take 1 tablet (100 mg total) by mouth 3 (three) times daily. 270 tablet 3  .  HYDROcodone-acetaminophen (NORCO/VICODIN) 5-325 MG tablet TAKE 1 TABLET BY MOUTH EVERY 4 HOURS AS NEEDED FOR ACUTE PAIN. MAX 5 PER DAY. 30 tablet 0  . insulin glargine (LANTUS) 100 UNIT/ML injection Inject 30 Units at bedtime into the skin.     Marland Kitchen insulin lispro (HUMALOG) 100 UNIT/ML injection Inject into the skin 3 (three) times daily before meals.    . Lancets (FREESTYLE) lancets Use as instructed 100 each 12  . lubiprostone (AMITIZA) 24 MCG capsule Take 1 capsule (24 mcg total) by mouth 2 (two) times daily with a meal. 10 capsule 0  . meloxicam (MOBIC) 15 MG tablet Take 1 tablet (15 mg total) by mouth daily. 30 tablet 54  . metFORMIN (GLUCOPHAGE) 500 MG tablet Take 500 mg 2 (two) times daily with a meal by mouth.    . mupirocin ointment (BACTROBAN) 2 % Place 1 application into the nose 2 (two) times daily. 22 g 0  . naproxen (NAPROSYN) 500 MG tablet Take 500 mg by mouth 2 (two) times daily with a meal.    . nitroGLYCERIN (NITROSTAT) 0.4 MG SL tablet Place 1 tablet (0.4 mg total) under the tongue every 5 (five) minutes as needed for chest pain. 30 tablet 12  . omeprazole (PRILOSEC) 40 MG capsule Take 40 mg by mouth daily.    . ondansetron (ZOFRAN) 4 MG tablet Take 4 mg by mouth daily as needed for nausea or vomiting.    Marland Kitchen oxyCODONE-acetaminophen (PERCOCET/ROXICET) 5-325 MG tablet Take 1 tablet by mouth every 4 (four) hours as needed for severe pain.    . potassium chloride SA (K-DUR,KLOR-CON) 20 MEQ tablet Take 1 tablet (20 mEq total) by mouth daily. 30 tablet 0  . pyridostigmine (MESTINON) 60 MG tablet Take 60 mg by mouth every 8 (eight) hours.    . sacubitril-valsartan (ENTRESTO) 97-103 MG Take 1 tablet by mouth 2 (two) times daily. 60 tablet 0  . spironolactone (ALDACTONE) 50 MG tablet Take 1 tablet (50 mg total) by mouth daily. 90 tablet 3  . torsemide (DEMADEX) 20 MG tablet Please take 40 mg oral every morning and 20 mg every evening. (Patient taking differently: Take 20-40 mg 2 (two) times  daily by mouth. Please take 40 mg oral every morning and 20 mg every evening.) 90 tablet 0  . traZODone (DESYREL) 100 MG tablet Take 100 mg by mouth 3 (three) times daily after meals.     No current facility-administered medications for this visit.      Physical Exam  Blood pressure (!) 185/110, pulse 85, height 5\' 6"  (1.676 m), weight 235 lb (106.6 kg).  Constitutional: overall normal hygiene, normal nutrition, well developed, normal grooming, normal body habitus. Assistive device:none  Musculoskeletal: gait and station Limp  left, muscle tone and strength are normal, no tremors or atrophy is present.  .  Neurological: coordination overall normal.  Deep tendon reflex/nerve stretch intact.  Sensation normal.  Cranial nerves II-XII intact.   Skin:   Normal overall no scars, lesions, ulcers or rashes. No psoriasis.  Psychiatric: Alert and oriented x 3.  Recent memory intact, remote memory unclear.  Normal mood and affect. Well groomed.  Good eye contact.  Cardiovascular: overall no swelling, no varicosities, no edema bilaterally, normal temperatures of the legs and arms, no clubbing, cyanosis and good capillary refill.  Lymphatic: palpation is normal.  The left lower extremity is examined:  Inspection:  Thigh:  Non-tender and no defects  Knee has swelling 1+ effusion.                        Joint tenderness is present                        Patient is tender over the medial joint line  Lower Leg:  Has normal appearance and no tenderness or defects  Ankle:  Non-tender and no defects  Foot:  Non-tender and no defects Range of Motion:  Knee:  Range of motion is: 0-105                        Crepitus is  present  Ankle:  Range of motion is normal. Strength and Tone:  The left lower extremity has normal strength and tone. Stability:  Knee:  The knee is stable.  Ankle:  The ankle is stable.   All other systems reviewed and are negative   The patient has been educated about the  nature of the problem(s) and counseled on treatment options.  The patient appeared to understand what I have discussed and is in agreement with it.  Encounter Diagnosis  Name Primary?  . Chronic pain of left knee Yes    PROCEDURE NOTE:  The patient requests injections of the left knee , verbal consent was obtained.  The left knee was prepped appropriately after time out was performed.   Sterile technique was observed and injection of 1 cc of Depo-Medrol 40 mg with several cc's of plain xylocaine. Anesthesia was provided by ethyl chloride and a 20-gauge needle was used to inject the knee area. The injection was tolerated well.  A band aid dressing was applied.  The patient was advised to apply ice later today and tomorrow to the injection sight as needed.  PLAN Call if any problems.  Precautions discussed.  Continue current medications.   Return to clinic 1 month   Electronically Signed Sanjuana Kava, MD 9/5/20199:35 AM

## 2018-01-25 ENCOUNTER — Ambulatory Visit: Payer: Medicaid Other | Admitting: Orthopedic Surgery

## 2018-02-02 ENCOUNTER — Other Ambulatory Visit: Payer: Self-pay | Admitting: Orthopaedic Surgery

## 2018-02-03 ENCOUNTER — Ambulatory Visit: Payer: Medicaid Other | Admitting: Podiatry

## 2018-02-06 ENCOUNTER — Ambulatory Visit: Payer: Medicaid Other | Admitting: "Endocrinology

## 2018-02-07 ENCOUNTER — Encounter: Payer: Self-pay | Admitting: Podiatry

## 2018-02-07 ENCOUNTER — Ambulatory Visit: Payer: Medicaid Other | Admitting: Podiatry

## 2018-02-07 VITALS — BP 116/83 | HR 83

## 2018-02-07 DIAGNOSIS — M79675 Pain in left toe(s): Secondary | ICD-10-CM | POA: Diagnosis not present

## 2018-02-07 DIAGNOSIS — E1142 Type 2 diabetes mellitus with diabetic polyneuropathy: Secondary | ICD-10-CM

## 2018-02-07 DIAGNOSIS — L608 Other nail disorders: Secondary | ICD-10-CM

## 2018-02-07 DIAGNOSIS — M79674 Pain in right toe(s): Secondary | ICD-10-CM | POA: Diagnosis not present

## 2018-02-07 DIAGNOSIS — B351 Tinea unguium: Secondary | ICD-10-CM

## 2018-02-07 NOTE — Progress Notes (Signed)
This patient presents to the office with chief complaint of long thick nails and diabetic feet.  This patient  says there  is   pain and discomfort in her  feet.  This patient says there are long thick painful incurvated  nails.  These nails are painful walking and wearing shoes.  Patient has no history of infection or drainage from both feet.  Patient is unable to  self treat his own nails . This patient presents  to the office today for treatment of the  long nails and a foot evaluation due to history of  diabetes.  General Appearance  Alert, conversant and in no acute stress.  Vascular  Dorsalis pedis and posterior tibial  pulses are palpable  bilaterally.  Capillary return is within normal limits  bilaterally. Temperature is within normal limits  bilaterally.  Neurologic  Senn-Weinstein monofilament wire test within normal limits  bilaterally. Muscle power within normal limits bilaterally.  Nails Thick disfigured discolored nails with subungual debris  from hallux to fifth toes bilaterally. No evidence of bacterial infection or drainage bilaterally.Pincer nails noted.  Orthopedic  No limitations of motion of motion feet .  No crepitus or effusions noted.  No bony pathology or digital deformities noted.  Skin  normotropic skin with no porokeratosis noted bilaterally.  No signs of infections or ulcers noted.     Onychomycosis  Diabetes with no foot complications  IE  Debride nails x 10.  A diabetic foot exam was performed and there is no evidence of any vascular or neurologic pathology.   RTC 3 months.   Gardiner Barefoot DPM

## 2018-02-09 ENCOUNTER — Ambulatory Visit: Payer: Medicaid Other | Admitting: Cardiology

## 2018-02-09 NOTE — Progress Notes (Deleted)
Clinical Summary [name] is a 42 y.o.female  seen today for follow up of the following medical problems.  1. HTN - history of difficult to control bp - during recent admission aldactone increaed to 50mg  daily, has not had repeat labs yet - compliant with meds - suspect component of her HTN is related to untreated OSA, she is awaiting her cpap machine.      2. Chronic systolic heart failure - 10/5782 echo: LVEF 40-45%, grade II diastolic dysfunction - 10/9627 cath with normal coronaries - 06/2017 echo LVEF 35-40%, grade II diastolic dysfunction  - admit 09/2017 with CHF exacerbation and severe HTN - Her weight on admission was 252 lbs with a reported dry weight of 243 lbs - aldactone increased to 50mg  daily due to severe HTN that admission   - no recent edema since discharge. SOB at times. Has not been checking weights at home.  - compliant with meds - torsemide 40mg  and 20mg  in PM - limiting sodium intake.   - last visit coreg was increased to 50mg  bid    3. OSA - followed by Dr Luan Pulling - 06/2017 sleep study moderate OSA - has not picked up CPAP machine.     Past Medical History:  Diagnosis Date  . Anemia    H&H of 10.6/33 and 07/2008 and 11.9/35 and 09/2010  . Anxiety   . CHF (congestive heart failure) ([place])    a. EF 40-45% by echo in 12/2015 with cath showing normal cors  . Depression with anxiety   . Diabetes mellitus without complication ([place])   . Enlarged heart   . Hypertension   . Hypertensive heart disease 2009   Pulmonary edema postpartum; mild to moderate mitral regurgitation when hospitalized for CHF in 2009; Echocardiogram in 12/2009-no MR and normal EF; normal CXR in 09/2010  . Migraine headache   . Miscarriage 03/19/2013  . Obesity 04/16/2009  . Osteoarthritis, knee 03/29/2011  . Preeclampsia   . Pregnant   . Pulmonary edema   . Sleep apnea   . Threatened abortion in early pregnancy 03/15/2013     Allergies  Allergen Reactions    . Diclofenac Swelling    AND POSSIBLE SYNCOPE; tolerates ibuprofen per pt  . Tramadol Nausea And Vomiting and Nausea Only    Itching (12/21); tolerates ibuprofen per pt  . Vicodin [Hydrocodone-Acetaminophen] Itching and Nausea Only  . Acetaminophen   . Hydrocodone Bitartrate      Current Outpatient Medications  Medication Sig Dispense Refill  . albuterol (PROVENTIL HFA;VENTOLIN HFA) 108 (90 Base) MCG/ACT inhaler Inhale 1 puff into the lungs every 6 (six) hours as needed for wheezing or shortness of breath.    Marland Kitchen aspirin EC 81 MG tablet Take 81 mg by mouth daily.    . carvedilol (COREG) 25 MG tablet Take 2 tablets (50 mg total) by mouth 2 (two) times daily with a meal. 360 tablet 3  . citalopram (CELEXA) 20 MG tablet Take 20 mg by mouth daily.    Marland Kitchen gabapentin (NEURONTIN) 600 MG tablet Take 600 mg by mouth 3 (three) times daily.     . hydrALAZINE (APRESOLINE) 100 MG tablet Take 1 tablet (100 mg total) by mouth 3 (three) times daily. 270 tablet 3  . HYDROcodone-acetaminophen (NORCO/VICODIN) 5-325 MG tablet TAKE 1 TABLET BY MOUTH EVERY 4 HOURS AS NEEDED FOR ACUTE PAIN. MAX 5 PER DAY. 30 tablet 0  . insulin glargine (LANTUS) 100 UNIT/ML injection Inject 30 Units at bedtime into the skin.     Marland Kitchen  insulin lispro (HUMALOG) 100 UNIT/ML injection Inject into the skin 3 (three) times daily before meals.    . Lancets (FREESTYLE) lancets Use as instructed 100 each 12  . lubiprostone (AMITIZA) 24 MCG capsule Take 1 capsule (24 mcg total) by mouth 2 (two) times daily with a meal. 10 capsule 0  . meloxicam (MOBIC) 15 MG tablet Take 1 tablet (15 mg total) by mouth daily. 30 tablet 54  . metFORMIN (GLUCOPHAGE) 500 MG tablet Take 500 mg 2 (two) times daily with a meal by mouth.    . mupirocin ointment (BACTROBAN) 2 % Place 1 application into the nose 2 (two) times daily. 22 g 0  . naproxen (NAPROSYN) 500 MG tablet Take 500 mg by mouth 2 (two) times daily with a meal.    . nitroGLYCERIN (NITROSTAT) 0.4 MG SL  tablet Place 1 tablet (0.4 mg total) under the tongue every 5 (five) minutes as needed for chest pain. 30 tablet 12  . omeprazole (PRILOSEC) 40 MG capsule Take 40 mg by mouth daily.    . ondansetron (ZOFRAN) 4 MG tablet Take 4 mg by mouth daily as needed for nausea or vomiting.    Marland Kitchen oxyCODONE-acetaminophen (PERCOCET/ROXICET) 5-325 MG tablet Take 1 tablet by mouth every 4 (four) hours as needed for severe pain.    . potassium chloride SA (K-DUR,KLOR-CON) 20 MEQ tablet Take 1 tablet (20 mEq total) by mouth daily. 30 tablet 0  . pyridostigmine (MESTINON) 60 MG tablet Take 60 mg by mouth every 8 (eight) hours.    . sacubitril-valsartan (ENTRESTO) 97-103 MG Take 1 tablet by mouth 2 (two) times daily. 60 tablet 0  . spironolactone (ALDACTONE) 50 MG tablet Take 1 tablet (50 mg total) by mouth daily. 90 tablet 3  . torsemide (DEMADEX) 20 MG tablet Please take 40 mg oral every morning and 20 mg every evening. (Patient taking differently: Take 20-40 mg 2 (two) times daily by mouth. Please take 40 mg oral every morning and 20 mg every evening.) 90 tablet 0  . traZODone (DESYREL) 100 MG tablet Take 100 mg by mouth 3 (three) times daily after meals.     No current facility-administered medications for this visit.      Past Surgical History:  Procedure Laterality Date  . BREAST REDUCTION SURGERY  2002  . CARDIAC CATHETERIZATION N/A 12/22/2015   Procedure: Left Heart Cath and Coronary Angiography;  Surgeon: Peter M Martinique, MD;  Location: [facility];  Service: Cardiovascular;  Laterality: N/A;  . CESAREAN SECTION N/A 04/09/2014   Procedure: CESAREAN SECTION;  Surgeon: Mora Bellman, MD;  Location: [facility];  Service: Obstetrics;  Laterality: N/A;  . CHOLECYSTECTOMY       Allergies  Allergen Reactions  . Diclofenac Swelling    AND POSSIBLE SYNCOPE; tolerates ibuprofen per pt  . Tramadol Nausea And Vomiting and Nausea Only    Itching (12/21); tolerates ibuprofen per pt  . Vicodin  [Hydrocodone-Acetaminophen] Itching and Nausea Only  . Acetaminophen   . Hydrocodone Bitartrate       Family History  Problem Relation Age of Onset  . Diabetes Mother 59  . Heart disease Mother   . Hyperlipidemia Paternal Grandfather   . Hypertension Paternal Grandfather   . Heart disease Father   . Hypertension Father   . Heart disease Maternal Grandmother 60  . ADD / ADHD Son   . Hypertension Maternal Uncle   . Heart attack Brother   . Sudden death Neg Hx   . Colon cancer  Neg Hx   . Celiac disease Neg Hx   . Inflammatory bowel disease Neg Hx      Social History [name] reports that she has never smoked. She has never used smokeless tobacco. [name] reports that she drinks alcohol.   Review of Systems CONSTITUTIONAL: No weight loss, fever, chills, weakness or fatigue.  HEENT: Eyes: No visual loss, blurred vision, double vision or yellow sclerae.No hearing loss, sneezing, congestion, runny nose or sore throat.  SKIN: No rash or itching.  CARDIOVASCULAR:  RESPIRATORY: No shortness of breath, cough or sputum.  GASTROINTESTINAL: No anorexia, nausea, vomiting or diarrhea. No abdominal pain or blood.  GENITOURINARY: No burning on urination, no polyuria NEUROLOGICAL: No headache, dizziness, syncope, paralysis, ataxia, numbness or tingling in the extremities. No change in bowel or bladder control.  MUSCULOSKELETAL: No muscle, back pain, joint pain or stiffness.  LYMPHATICS: No enlarged nodes. No history of splenectomy.  PSYCHIATRIC: No history of depression or anxiety.  ENDOCRINOLOGIC: No reports of sweating, cold or heat intolerance. No polyuria or polydipsia.  Marland Kitchen   Physical Examination There were no vitals filed for this visit. There were no vitals filed for this visit.  Gen: resting comfortably, no acute distress HEENT: no scleral icterus, pupils equal round and reactive, no palptable cervical adenopathy,  CV Resp: Clear to auscultation bilaterally GI:  abdomen is soft, non-tender, non-distended, normal bowel sounds, no hepatosplenomegaly MSK: extremities are warm, no edema.  Skin: warm, no rash Neuro:  no focal deficits Psych: appropriate affect   Diagnostic Studies  10/2014 echo Study Conclusions  - Left ventricle: The cavity size was normal. There was moderate concentric hypertrophy. Systolic function was moderately reduced. The estimated ejection fraction was in the range of 35% to 40%. Images were suboptimal for LV wall motion assessment. There appeared to be global hypokinesis. Doppler parameters are consistent with abnormal left ventricular relaxation (grade 1 diastolic dysfunction). - Mitral valve: There was trivial regurgitation. - Left atrium: The atrium was mildly dilated.   12/2015 cath  The left ventricular systolic function is normal.  LV end diastolic pressure is mildly elevated.  The left ventricular ejection fraction is 50-55% by visual estimate.  1. No significant CAD 2. Good LV function 3. Mildly elevated LV EDP.   03/2016 cardiac MRI IMPRESSION: 1. Technically difficult study.  2. Normal LV size with moderate LV hypertrophy, EF 35%.  3. Mildly dilated RV with mildly decreased RV systolic function.  4. No myocardial LGE, so no definitive evidence for prior myocardial infarction, infiltrative disease, or myocarditis.   06/2017 echo Study Conclusions  - Left ventricle: The cavity size was normal. Wall thickness was increased in a pattern of moderate LVH. Systolic function was moderately reduced. The estimated ejection fraction was in the range of 35% to 40%. Diffuse hypokinesis. Features are consistent with a pseudonormal left ventricular filling pattern, with concomitant abnormal relaxation and increased filling pressure (grade 2 diastolic dysfunction). Doppler parameters are consistent with high ventricular filling pressure. - Aortic valve: Mildly  calcified annulus. Trileaflet; mildly thickened leaflets. - Mitral valve: There was mild regurgitation. - Left atrium: The atrium was moderately dilated.  06/2017 Sleep study IMPRESSIONS Moderate obstructive sleep apneaworse during REM sleep is noted during this study.AutoPap 9-14 Is recommended.   Assessment and Plan  1.HTN -difficult to control, likely due to her untreated OSA  - increase coreg to 37.5mg  bid, given her BMI this is an acceptable dose - continue other meds. With recent increase in aldactone check labs, pending  her K level may need to stop her supplement KCl  2. Chronic systolic heart failure - stable since discharge, continue current meds other than increasing coreg as described above.  - may add nitrate to her hydral at f/u.   3. OSA -strongly encouraged to go ahead and pick up her CPAP machine - continue to follow with Dr Luan Pulling.       Arnoldo Lenis, M.D., F.A.C.C.

## 2018-02-16 ENCOUNTER — Encounter: Payer: Self-pay | Admitting: Orthopaedic Surgery

## 2018-02-16 ENCOUNTER — Ambulatory Visit: Payer: Medicaid Other | Admitting: Orthopaedic Surgery

## 2018-03-08 ENCOUNTER — Ambulatory Visit: Payer: Medicaid Other | Admitting: Gastroenterology

## 2018-03-08 ENCOUNTER — Encounter

## 2018-03-08 ENCOUNTER — Telehealth: Payer: Self-pay | Admitting: Gastroenterology

## 2018-03-08 ENCOUNTER — Encounter: Payer: Self-pay | Admitting: Gastroenterology

## 2018-03-08 NOTE — Telephone Encounter (Signed)
PATIENT WAS A NO SHOW AND LETTER SENT  °

## 2018-04-30 ENCOUNTER — Encounter (HOSPITAL_COMMUNITY): Payer: Self-pay | Admitting: *Deleted

## 2018-04-30 ENCOUNTER — Inpatient Hospital Stay (HOSPITAL_COMMUNITY): Payer: Medicaid Other

## 2018-04-30 ENCOUNTER — Inpatient Hospital Stay (HOSPITAL_COMMUNITY)
Admission: EM | Admit: 2018-04-30 | Discharge: 2018-05-04 | DRG: 291 | Disposition: A | Discharge status: Home / Self Care | Payer: Medicaid Other | Attending: Internal Medicine | Admitting: Internal Medicine

## 2018-04-30 ENCOUNTER — Other Ambulatory Visit: Payer: Self-pay

## 2018-04-30 ENCOUNTER — Emergency Department (HOSPITAL_COMMUNITY): Payer: Medicaid Other

## 2018-04-30 DIAGNOSIS — R319 Hematuria, unspecified: Secondary | ICD-10-CM | POA: Diagnosis present

## 2018-04-30 DIAGNOSIS — E119 Type 2 diabetes mellitus without complications: Secondary | ICD-10-CM | POA: Diagnosis present

## 2018-04-30 DIAGNOSIS — Z79899 Other long term (current) drug therapy: Secondary | ICD-10-CM

## 2018-04-30 DIAGNOSIS — J209 Acute bronchitis, unspecified: Secondary | ICD-10-CM | POA: Diagnosis present

## 2018-04-30 DIAGNOSIS — I509 Heart failure, unspecified: Secondary | ICD-10-CM

## 2018-04-30 DIAGNOSIS — G4733 Obstructive sleep apnea (adult) (pediatric): Secondary | ICD-10-CM

## 2018-04-30 DIAGNOSIS — J189 Pneumonia, unspecified organism: Secondary | ICD-10-CM | POA: Diagnosis present

## 2018-04-30 DIAGNOSIS — Z8349 Family history of other endocrine, nutritional and metabolic diseases: Secondary | ICD-10-CM

## 2018-04-30 DIAGNOSIS — D509 Iron deficiency anemia, unspecified: Secondary | ICD-10-CM | POA: Diagnosis present

## 2018-04-30 DIAGNOSIS — R05 Cough: Secondary | ICD-10-CM

## 2018-04-30 DIAGNOSIS — E118 Type 2 diabetes mellitus with unspecified complications: Secondary | ICD-10-CM | POA: Diagnosis present

## 2018-04-30 DIAGNOSIS — R778 Other specified abnormalities of plasma proteins: Secondary | ICD-10-CM | POA: Diagnosis present

## 2018-04-30 DIAGNOSIS — Z79891 Long term (current) use of opiate analgesic: Secondary | ICD-10-CM

## 2018-04-30 DIAGNOSIS — Z9989 Dependence on other enabling machines and devices: Secondary | ICD-10-CM

## 2018-04-30 DIAGNOSIS — R197 Diarrhea, unspecified: Secondary | ICD-10-CM | POA: Diagnosis present

## 2018-04-30 DIAGNOSIS — Z794 Long term (current) use of insulin: Secondary | ICD-10-CM | POA: Diagnosis not present

## 2018-04-30 DIAGNOSIS — I110 Hypertensive heart disease with heart failure: Principal | ICD-10-CM | POA: Diagnosis present

## 2018-04-30 DIAGNOSIS — D72829 Elevated white blood cell count, unspecified: Secondary | ICD-10-CM | POA: Diagnosis not present

## 2018-04-30 DIAGNOSIS — J9811 Atelectasis: Secondary | ICD-10-CM | POA: Diagnosis present

## 2018-04-30 DIAGNOSIS — Z886 Allergy status to analgesic agent status: Secondary | ICD-10-CM

## 2018-04-30 DIAGNOSIS — R7989 Other specified abnormal findings of blood chemistry: Secondary | ICD-10-CM | POA: Diagnosis present

## 2018-04-30 DIAGNOSIS — J810 Acute pulmonary edema: Secondary | ICD-10-CM

## 2018-04-30 DIAGNOSIS — Z7982 Long term (current) use of aspirin: Secondary | ICD-10-CM

## 2018-04-30 DIAGNOSIS — Z791 Long term (current) use of non-steroidal anti-inflammatories (NSAID): Secondary | ICD-10-CM

## 2018-04-30 DIAGNOSIS — Z888 Allergy status to other drugs, medicaments and biological substances status: Secondary | ICD-10-CM

## 2018-04-30 DIAGNOSIS — F329 Major depressive disorder, single episode, unspecified: Secondary | ICD-10-CM | POA: Diagnosis present

## 2018-04-30 DIAGNOSIS — R8281 Pyuria: Secondary | ICD-10-CM | POA: Diagnosis present

## 2018-04-30 DIAGNOSIS — G629 Polyneuropathy, unspecified: Secondary | ICD-10-CM | POA: Diagnosis present

## 2018-04-30 DIAGNOSIS — J069 Acute upper respiratory infection, unspecified: Secondary | ICD-10-CM | POA: Diagnosis present

## 2018-04-30 DIAGNOSIS — R0603 Acute respiratory distress: Secondary | ICD-10-CM | POA: Diagnosis present

## 2018-04-30 DIAGNOSIS — I43 Cardiomyopathy in diseases classified elsewhere: Secondary | ICD-10-CM | POA: Diagnosis present

## 2018-04-30 DIAGNOSIS — R8271 Bacteriuria: Secondary | ICD-10-CM | POA: Diagnosis present

## 2018-04-30 DIAGNOSIS — I5043 Acute on chronic combined systolic (congestive) and diastolic (congestive) heart failure: Secondary | ICD-10-CM | POA: Diagnosis present

## 2018-04-30 DIAGNOSIS — E66812 Obesity, class 2: Secondary | ICD-10-CM | POA: Diagnosis present

## 2018-04-30 DIAGNOSIS — I252 Old myocardial infarction: Secondary | ICD-10-CM | POA: Diagnosis not present

## 2018-04-30 DIAGNOSIS — I10 Essential (primary) hypertension: Secondary | ICD-10-CM | POA: Diagnosis not present

## 2018-04-30 DIAGNOSIS — Z885 Allergy status to narcotic agent status: Secondary | ICD-10-CM

## 2018-04-30 DIAGNOSIS — I5041 Acute combined systolic (congestive) and diastolic (congestive) heart failure: Secondary | ICD-10-CM

## 2018-04-30 DIAGNOSIS — Z833 Family history of diabetes mellitus: Secondary | ICD-10-CM

## 2018-04-30 DIAGNOSIS — Z8249 Family history of ischemic heart disease and other diseases of the circulatory system: Secondary | ICD-10-CM

## 2018-04-30 DIAGNOSIS — I160 Hypertensive urgency: Secondary | ICD-10-CM | POA: Diagnosis present

## 2018-04-30 DIAGNOSIS — R058 Other specified cough: Secondary | ICD-10-CM

## 2018-04-30 DIAGNOSIS — N393 Stress incontinence (female) (male): Secondary | ICD-10-CM | POA: Diagnosis present

## 2018-04-30 DIAGNOSIS — R059 Cough, unspecified: Secondary | ICD-10-CM

## 2018-04-30 DIAGNOSIS — I119 Hypertensive heart disease without heart failure: Secondary | ICD-10-CM | POA: Diagnosis present

## 2018-04-30 DIAGNOSIS — Z9049 Acquired absence of other specified parts of digestive tract: Secondary | ICD-10-CM | POA: Diagnosis not present

## 2018-04-30 DIAGNOSIS — F32A Depression, unspecified: Secondary | ICD-10-CM | POA: Diagnosis present

## 2018-04-30 LAB — CBC WITH DIFFERENTIAL/PLATELET
Abs Immature Granulocytes: 0.05 10*3/uL (ref 0.00–0.07)
BASOS PCT: 0 %
Basophils Absolute: 0 10*3/uL (ref 0.0–0.1)
EOS ABS: 0.1 10*3/uL (ref 0.0–0.5)
Eosinophils Relative: 0 %
HCT: 30.6 % — ABNORMAL LOW (ref 36.0–46.0)
Hemoglobin: 9.1 g/dL — ABNORMAL LOW (ref 12.0–15.0)
Immature Granulocytes: 0 %
Lymphocytes Relative: 15 %
Lymphs Abs: 1.8 10*3/uL (ref 0.7–4.0)
MCH: 22.1 pg — AB (ref 26.0–34.0)
MCHC: 29.7 g/dL — AB (ref 30.0–36.0)
MCV: 74.5 fL — AB (ref 80.0–100.0)
MONO ABS: 0.8 10*3/uL (ref 0.1–1.0)
MONOS PCT: 7 %
NEUTROS PCT: 78 %
Neutro Abs: 8.9 10*3/uL — ABNORMAL HIGH (ref 1.7–7.7)
PLATELETS: 240 10*3/uL (ref 150–400)
RBC: 4.11 MIL/uL (ref 3.87–5.11)
RDW: 14.6 % (ref 11.5–15.5)
WBC: 11.6 10*3/uL — ABNORMAL HIGH (ref 4.0–10.5)
nRBC: 0 % (ref 0.0–0.2)

## 2018-04-30 LAB — INFLUENZA PANEL BY PCR (TYPE A & B)
INFLAPCR: NEGATIVE
Influenza B By PCR: NEGATIVE

## 2018-04-30 LAB — URINALYSIS, ROUTINE W REFLEX MICROSCOPIC
BILIRUBIN URINE: NEGATIVE
GLUCOSE, UA: NEGATIVE mg/dL
Ketones, ur: NEGATIVE mg/dL
NITRITE: NEGATIVE
PH: 5 (ref 5.0–8.0)
Protein, ur: 100 mg/dL — AB
Specific Gravity, Urine: 1.028 (ref 1.005–1.030)
WBC, UA: >50 WBC/hpf — ABNORMAL HIGH (ref 0–5)

## 2018-04-30 LAB — COMPREHENSIVE METABOLIC PANEL
ALBUMIN: 3.3 g/dL — AB (ref 3.5–5.0)
ALK PHOS: 68 U/L (ref 38–126)
ALT: 20 U/L (ref 0–44)
ANION GAP: 9 (ref 5–15)
AST: 26 U/L (ref 15–41)
BILIRUBIN TOTAL: 0.5 mg/dL (ref 0.3–1.2)
BUN: 22 mg/dL — AB (ref 6–20)
CALCIUM: 8.6 mg/dL — AB (ref 8.9–10.3)
CO2: 27 mmol/L (ref 22–32)
Chloride: 98 mmol/L (ref 98–111)
Creatinine, Ser: 1.15 mg/dL — ABNORMAL HIGH (ref 0.44–1.00)
GFR calc Af Amer: >60 mL/min (ref >60)
GFR calc non Af Amer: 59 mL/min — ABNORMAL LOW (ref >60)
GLUCOSE: 163 mg/dL — AB (ref 70–99)
Potassium: 3.8 mmol/L (ref 3.5–5.1)
Sodium: 134 mmol/L — ABNORMAL LOW (ref 135–145)
TOTAL PROTEIN: 7.3 g/dL (ref 6.5–8.1)

## 2018-04-30 LAB — TROPONIN I: Troponin I: 0.04 ng/mL (ref <0.03)

## 2018-04-30 LAB — BRAIN NATRIURETIC PEPTIDE: B Natriuretic Peptide: 1008 pg/mL — ABNORMAL HIGH (ref 0.0–100.0)

## 2018-04-30 LAB — ECHOCARDIOGRAM COMPLETE
Height: 66 in
Weight: 3781.33 oz

## 2018-04-30 LAB — CBG MONITORING, ED: GLUCOSE-CAPILLARY: 195 mg/dL — AB (ref 70–99)

## 2018-04-30 LAB — I-STAT CG4 LACTIC ACID, ED: Lactic Acid, Venous: 1.38 mmol/L (ref 0.5–1.9)

## 2018-04-30 MED ORDER — ENOXAPARIN SODIUM 40 MG/0.4ML ~~LOC~~ SOLN: 40.0000 mg | SUBCUTANEOUS | Status: DC

## 2018-04-30 MED ORDER — PANTOPRAZOLE SODIUM 40 MG PO TBEC
40.0000 mg | DELAYED_RELEASE_TABLET | Freq: Every day | ORAL | Status: DC | PRN
  Administered 2018-04-30: 40 mg via ORAL
  Filled 2018-04-30: qty 1

## 2018-04-30 MED ORDER — CITALOPRAM HYDROBROMIDE 20 MG PO TABS
20.0000 mg | ORAL_TABLET | Freq: Every day | ORAL | Status: DC
  Administered 2018-04-30 – 2018-05-04 (×5): 20 mg via ORAL
  Filled 2018-04-30 (×4): qty 1

## 2018-04-30 MED ORDER — BENZONATATE 100 MG PO CAPS
100.0000 mg | ORAL_CAPSULE | Freq: Once | ORAL | Status: AC
  Administered 2018-04-30: 100 mg via ORAL
  Filled 2018-04-30: qty 1

## 2018-04-30 MED ORDER — NITROGLYCERIN 0.4 MG SL SUBL: 0.4000 mg | SUBLINGUAL_TABLET | SUBLINGUAL | Status: DC | PRN

## 2018-04-30 MED ORDER — GABAPENTIN 300 MG PO CAPS
600.0000 mg | ORAL_CAPSULE | Freq: Three times a day (TID) | ORAL | Status: DC
  Administered 2018-04-30 – 2018-05-04 (×12): 600 mg via ORAL
  Filled 2018-04-30 (×12): qty 2

## 2018-04-30 MED ORDER — IBUPROFEN 400 MG PO TABS
400.0000 mg | ORAL_TABLET | Freq: Three times a day (TID) | ORAL | Status: DC | PRN
  Administered 2018-04-30: 400 mg via ORAL
  Filled 2018-04-30: qty 1

## 2018-04-30 MED ORDER — HYDRALAZINE HCL 25 MG PO TABS
100.0000 mg | ORAL_TABLET | Freq: Three times a day (TID) | ORAL | Status: DC
  Administered 2018-04-30 – 2018-05-03 (×7): 100 mg via ORAL
  Filled 2018-04-30 (×9): qty 4

## 2018-04-30 MED ORDER — INSULIN ASPART 100 UNIT/ML ~~LOC~~ SOLN
0.0000 [IU] | Freq: Every day | SUBCUTANEOUS | Status: DC
  Administered 2018-05-03: 4 [IU] via SUBCUTANEOUS

## 2018-04-30 MED ORDER — INSULIN ASPART 100 UNIT/ML ~~LOC~~ SOLN
0.0000 [IU] | Freq: Three times a day (TID) | SUBCUTANEOUS | Status: DC
  Administered 2018-04-30: 3 [IU] via SUBCUTANEOUS
  Administered 2018-04-30: 5 [IU] via SUBCUTANEOUS
  Administered 2018-05-01 – 2018-05-02 (×4): 3 [IU] via SUBCUTANEOUS
  Administered 2018-05-02: 2 [IU] via SUBCUTANEOUS
  Administered 2018-05-02: 3 [IU] via SUBCUTANEOUS
  Administered 2018-05-03: 2 [IU] via SUBCUTANEOUS
  Administered 2018-05-03: 3 [IU] via SUBCUTANEOUS
  Administered 2018-05-03 – 2018-05-04 (×2): 8 [IU] via SUBCUTANEOUS
  Administered 2018-05-04: 5 [IU] via SUBCUTANEOUS

## 2018-04-30 MED ORDER — CARVEDILOL 12.5 MG PO TABS
50.0000 mg | ORAL_TABLET | Freq: Two times a day (BID) | ORAL | Status: DC
  Administered 2018-04-30 – 2018-05-03 (×6): 50 mg via ORAL
  Filled 2018-04-30 (×6): qty 4

## 2018-04-30 MED ORDER — SACUBITRIL-VALSARTAN 97-103 MG PO TABS
1.0000 | ORAL_TABLET | Freq: Two times a day (BID) | ORAL | Status: DC
  Administered 2018-04-30 – 2018-05-03 (×7): 1 via ORAL
  Filled 2018-04-30 (×12): qty 1

## 2018-04-30 MED ORDER — BENZONATATE 100 MG PO CAPS
200.0000 mg | ORAL_CAPSULE | Freq: Three times a day (TID) | ORAL | Status: DC
  Administered 2018-04-30 – 2018-05-04 (×13): 200 mg via ORAL
  Filled 2018-04-30 (×13): qty 2

## 2018-04-30 MED ORDER — TRAZODONE HCL 50 MG PO TABS
100.0000 mg | ORAL_TABLET | Freq: Every day | ORAL | Status: DC
  Administered 2018-04-30 – 2018-05-03 (×4): 100 mg via ORAL
  Filled 2018-04-30 (×4): qty 2

## 2018-04-30 MED ORDER — INSULIN GLARGINE 100 UNIT/ML ~~LOC~~ SOLN
15.0000 [IU] | Freq: Every day | SUBCUTANEOUS | Status: DC
  Administered 2018-04-30: 15 [IU] via SUBCUTANEOUS
  Filled 2018-04-30 (×2): qty 0.15

## 2018-04-30 MED ORDER — GABAPENTIN 600 MG PO TABS
600.0000 mg | ORAL_TABLET | Freq: Three times a day (TID) | ORAL | Status: DC
  Filled 2018-04-30 (×3): qty 1

## 2018-04-30 MED ORDER — POTASSIUM CHLORIDE CRYS ER 20 MEQ PO TBCR
40.0000 meq | EXTENDED_RELEASE_TABLET | Freq: Every day | ORAL | Status: DC
  Administered 2018-04-30 – 2018-05-04 (×5): 40 meq via ORAL
  Filled 2018-04-30 (×5): qty 2

## 2018-04-30 MED ORDER — SPIRONOLACTONE 25 MG PO TABS
50.0000 mg | ORAL_TABLET | Freq: Every day | ORAL | Status: DC
  Administered 2018-04-30 – 2018-05-03 (×4): 50 mg via ORAL
  Filled 2018-04-30 (×6): qty 2

## 2018-04-30 MED ORDER — ENOXAPARIN SODIUM 60 MG/0.6ML ~~LOC~~ SOLN
50.0000 mg | SUBCUTANEOUS | Status: DC
  Administered 2018-05-01 – 2018-05-03 (×3): 50 mg via SUBCUTANEOUS
  Filled 2018-04-30 (×5): qty 0.6

## 2018-04-30 MED ORDER — GUAIFENESIN-DM 100-10 MG/5ML PO SYRP
10.0000 mL | ORAL_SOLUTION | ORAL | Status: DC | PRN
  Administered 2018-04-30 – 2018-05-01 (×3): 10 mL via ORAL
  Filled 2018-04-30 (×3): qty 10

## 2018-04-30 MED ORDER — PYRIDOSTIGMINE BROMIDE 60 MG PO TABS
60.0000 mg | ORAL_TABLET | Freq: Three times a day (TID) | ORAL | Status: DC
  Administered 2018-04-30 – 2018-05-04 (×12): 60 mg via ORAL
  Filled 2018-04-30 (×17): qty 1

## 2018-04-30 MED ORDER — ASPIRIN EC 81 MG PO TBEC
81.0000 mg | DELAYED_RELEASE_TABLET | Freq: Every day | ORAL | Status: DC
  Administered 2018-04-30 – 2018-05-04 (×5): 81 mg via ORAL
  Filled 2018-04-30 (×5): qty 1

## 2018-04-30 MED ORDER — ONDANSETRON HCL 4 MG/2ML IJ SOLN
4.0000 mg | Freq: Four times a day (QID) | INTRAMUSCULAR | Status: DC | PRN
  Administered 2018-05-03 – 2018-05-04 (×2): 4 mg via INTRAVENOUS
  Filled 2018-04-30 (×2): qty 2

## 2018-04-30 MED ORDER — LABETALOL HCL 5 MG/ML IV SOLN
5.0000 mg | Freq: Once | INTRAVENOUS | Status: AC
  Administered 2018-04-30: 5 mg via INTRAVENOUS
  Filled 2018-04-30: qty 4

## 2018-04-30 MED ORDER — ONDANSETRON HCL 4 MG PO TABS
4.0000 mg | ORAL_TABLET | Freq: Four times a day (QID) | ORAL | Status: DC | PRN
  Administered 2018-04-30 – 2018-05-01 (×2): 4 mg via ORAL
  Filled 2018-04-30 (×2): qty 1

## 2018-04-30 MED ORDER — HYDROCOD POLST-CPM POLST ER 10-8 MG/5ML PO SUER
5.0000 mL | Freq: Two times a day (BID) | ORAL | Status: DC | PRN
  Administered 2018-05-01 – 2018-05-03 (×4): 5 mL via ORAL
  Filled 2018-04-30 (×4): qty 5

## 2018-04-30 MED ORDER — ACETAMINOPHEN 325 MG PO TABS
650.0000 mg | ORAL_TABLET | Freq: Four times a day (QID) | ORAL | Status: DC | PRN
  Administered 2018-04-30 – 2018-05-03 (×6): 650 mg via ORAL
  Filled 2018-04-30 (×7): qty 2

## 2018-04-30 MED ORDER — HYDRALAZINE HCL 25 MG PO TABS
100.0000 mg | ORAL_TABLET | Freq: Once | ORAL | Status: AC
  Administered 2018-04-30: 100 mg via ORAL
  Filled 2018-04-30: qty 4

## 2018-04-30 MED ORDER — HYDRALAZINE HCL 20 MG/ML IJ SOLN: 10.0000 mg | Freq: Once | INTRAMUSCULAR | Status: DC

## 2018-04-30 MED ORDER — SODIUM CHLORIDE 0.9% FLUSH
3.0000 mL | Freq: Two times a day (BID) | INTRAVENOUS | Status: DC
  Administered 2018-04-30 – 2018-05-04 (×8): 3 mL via INTRAVENOUS

## 2018-04-30 MED ORDER — FUROSEMIDE 10 MG/ML IJ SOLN
60.0000 mg | Freq: Two times a day (BID) | INTRAMUSCULAR | Status: DC
  Administered 2018-04-30 – 2018-05-01 (×2): 60 mg via INTRAVENOUS
  Filled 2018-04-30 (×2): qty 6

## 2018-04-30 MED ORDER — FUROSEMIDE 10 MG/ML IJ SOLN
80.0000 mg | Freq: Once | INTRAMUSCULAR | Status: AC
  Administered 2018-04-30: 80 mg via INTRAVENOUS
  Filled 2018-04-30: qty 8

## 2018-04-30 MED ORDER — ALBUTEROL SULFATE (2.5 MG/3ML) 0.083% IN NEBU
2.5000 mg | INHALATION_SOLUTION | RESPIRATORY_TRACT | Status: DC | PRN
  Administered 2018-04-30 – 2018-05-03 (×4): 2.5 mg via RESPIRATORY_TRACT
  Filled 2018-04-30 (×4): qty 3

## 2018-04-30 NOTE — ED Notes (Signed)
CRITICAL VALUE ALERT  Critical Value:  Troponin 0.04 Date & Time Notied:  04/30/18 @ 1157 Provider Notified:[facility] Orders Received/Actions taken: GMP

## 2018-04-30 NOTE — ED Notes (Signed)
Pt also complains of abd pain with nausea as well,

## 2018-04-30 NOTE — Progress Notes (Signed)
RN called to patient room, patient found sitting on the side of the bed coughing. She stated that she felt terrible, had the chills, and was freezing. She also stated she was short of breath from coughing. On assessment her temp was 101.1 orally. Her HR 108 and Oxygen sat 98 on room air.   MD paged for new orders for fever. Will continue to monitor.

## 2018-04-30 NOTE — H&P (Signed)
History and Physical   Chelsey Santiago OAC:[id number] DOB: January 25, 1976 DOA: 04/30/2018  Referring MD/NP/PA: Dr. Dayna Barker, [place] PCP: Rosita Fire, MD Outpatient Specialists: Cardiology, Dr. Harl Bowie  Patient coming from: Home  Chief Complaint: Cough, shortness of breath  HPI: Chelsey Santiago is a 42 y.o. female with a history of CHF, HTN, obesity, IDDM who presented with a few days of cough and generally "feeling bad." Last week she noticed increased leg swelling and trouble breathing worse when she was laying flat or exerting herself so she took extra doses of torsemide. This helped the swelling somewhat, though the orthopnea and cough worsened starting 3 days prior to arrival despite taking home medications. She denies wheezing, fever, sick contacts or chest pain. Albuterol did not seem to help. She's had clear rhinorrhea and the cough has grown severe, persistent and worse last night so she came to the ED.  ED Course: Coughing, dyspneic, worse with walking to bathroom, severe range HTN improved with IV medication. Cr 1.15, BNP >1k, troponin 0.04, ECG without acute ischemic changes. CXR with cardiomegaly and vascular congestion consistent with CHF. WBC near baseline at 11.6, lactic acid normal, microcytic anemia at 9.1g/dl noted. Pyuria and bacteriuria noted on UA, though pt without symptoms. Urine and blood cultures sent. Lasix given and admission requested.  Review of Systems: +abdominal swelling, has had some nausea, no diarrhea or current vomiting, and per HPI. All others reviewed and are negative.   Past Medical History:  Diagnosis Date  . Anemia    H&H of 10.6/33 and 07/2008 and 11.9/35 and 09/2010  . Anxiety   . CHF (congestive heart failure) ([place])    a. EF 40-45% by echo in 12/2015 with cath showing normal cors  . Depression with anxiety   . Diabetes mellitus without complication ([place])   . Enlarged heart   . Hypertension   . Hypertensive heart disease 2009   Pulmonary edema  postpartum; mild to moderate mitral regurgitation when hospitalized for CHF in 2009; Echocardiogram in 12/2009-no MR and normal EF; normal CXR in 09/2010  . Migraine headache   . Miscarriage 03/19/2013  . Obesity 04/16/2009  . Osteoarthritis, knee 03/29/2011  . Preeclampsia   . Pregnant   . Pulmonary edema   . Sleep apnea   . Threatened abortion in early pregnancy 03/15/2013   Past Surgical History:  Procedure Laterality Date  . BREAST REDUCTION SURGERY  2002  . CARDIAC CATHETERIZATION N/A 12/22/2015   Procedure: Left Heart Cath and Coronary Angiography;  Surgeon: Peter M Martinique, MD;  Location: [facility];  Service: Cardiovascular;  Laterality: N/A;  . CESAREAN SECTION N/A 04/09/2014   Procedure: CESAREAN SECTION;  Surgeon: Mora Bellman, MD;  Location: [facility];  Service: Obstetrics;  Laterality: N/A;  . CHOLECYSTECTOMY     - Nonsmoker, lives as primary cargiver for 4 children living at home, also has 2 grown children. No EtOH in past week. No illicit drugs.  Allergies  Allergen Reactions  . Diclofenac Swelling    AND POSSIBLE SYNCOPE; tolerates ibuprofen per pt  . Tramadol Nausea And Vomiting and Nausea Only    Itching (12/21); tolerates ibuprofen per pt  . Vicodin [Hydrocodone-Acetaminophen] Itching and Nausea Only  . Acetaminophen   . Hydrocodone Bitartrate    Family History  Problem Relation Age of Onset  . Diabetes Mother 25  . Heart disease Mother   . Hyperlipidemia Paternal Grandfather   . Hypertension Paternal Grandfather   . Heart disease Father   . Hypertension  Father   . Heart disease Maternal Grandmother 60  . ADD / ADHD Son   . Hypertension Maternal Uncle   . Heart attack Brother   . Sudden death Neg Hx   . Colon cancer Neg Hx   . Celiac disease Neg Hx   . Inflammatory bowel disease Neg Hx    - Family history + for CHF in mother and grandmother, otherwise reviewed and not pertinent.  Prior to Admission medications   Medication Sig Start Date End Date  Taking? Authorizing Provider  albuterol (PROVENTIL HFA;VENTOLIN HFA) 108 (90 Base) MCG/ACT inhaler Inhale 1 puff into the lungs every 6 (six) hours as needed for wheezing or shortness of breath.    [provider]  aspirin EC 81 MG tablet Take 81 mg by mouth daily.    [provider]  carvedilol (COREG) 25 MG tablet Take 2 tablets (50 mg total) by mouth 2 (two) times daily with a meal. 12/06/17   Strader, [place] M, PA-C  citalopram (CELEXA) 20 MG tablet Take 20 mg by mouth daily. 08/19/17   [provider]  gabapentin (NEURONTIN) 600 MG tablet Take 600 mg by mouth 3 (three) times daily.     [provider]  hydrALAZINE (APRESOLINE) 100 MG tablet Take 1 tablet (100 mg total) by mouth 3 (three) times daily. 07/12/17   Arnoldo Lenis, MD  HYDROcodone-acetaminophen (NORCO/VICODIN) 5-325 MG tablet TAKE 1 TABLET BY MOUTH EVERY 4 HOURS AS NEEDED FOR ACUTE PAIN. MAX 5 PER DAY. 01/10/18   Sanjuana Kava, MD  insulin glargine (LANTUS) 100 UNIT/ML injection Inject 30 Units at bedtime into the skin.     [provider]  insulin lispro (HUMALOG) 100 UNIT/ML injection Inject into the skin 3 (three) times daily before meals.    [provider]  Lancets (FREESTYLE) lancets Use as instructed 04/09/16   Arrien, Jimmy Picket, MD  lubiprostone [facility]) 24 MCG capsule Take 1 capsule (24 mcg total) by mouth 2 (two) times daily with a meal. 10/17/17   Mahala Menghini, PA-C  meloxicam (MOBIC) 15 MG tablet Take 1 tablet (15 mg total) by mouth daily. 06/08/17   Sanjuana Kava, MD  metFORMIN (GLUCOPHAGE) 500 MG tablet Take 500 mg 2 (two) times daily with a meal by mouth.    [provider]  mupirocin ointment (BACTROBAN) 2 % Place 1 application into the nose 2 (two) times daily. 09/30/17   Lorella Nimrod, MD  naproxen (NAPROSYN) 500 MG tablet Take 500 mg by mouth 2 (two) times daily with a meal.    [provider]  nitroGLYCERIN (NITROSTAT) 0.4 MG SL  tablet Place 1 tablet (0.4 mg total) under the tongue every 5 (five) minutes as needed for chest pain. 09/05/15   Kathie Dike, MD  omeprazole (PRILOSEC) 40 MG capsule Take 40 mg by mouth daily.    [provider]  ondansetron (ZOFRAN) 4 MG tablet Take 4 mg by mouth daily as needed for nausea or vomiting.    [provider]  oxyCODONE-acetaminophen (PERCOCET/ROXICET) 5-325 MG tablet Take 1 tablet by mouth every 4 (four) hours as needed for severe pain.    [provider]  potassium chloride SA (K-DUR,KLOR-CON) 20 MEQ tablet Take 1 tablet (20 mEq total) by mouth daily. 05/12/17   Holley Bouche, NP  pyridostigmine (MESTINON) 60 MG tablet Take 60 mg by mouth every 8 (eight) hours.    [provider]  sacubitril-valsartan (ENTRESTO) 97-103 MG Take 1 tablet by mouth 2 (two)  times daily. 04/09/16   Arrien, Jimmy Picket, MD  spironolactone (ALDACTONE) 50 MG tablet Take 1 tablet (50 mg total) by mouth daily. 09/30/17 12/29/17  Lorella Nimrod, MD  torsemide (DEMADEX) 20 MG tablet Please take 40 mg oral every morning and 20 mg every evening. Patient taking differently: Take 20-40 mg 2 (two) times daily by mouth. Please take 40 mg oral every morning and 20 mg every evening. 05/10/16   Elgergawy, Silver Huguenin, MD  traZODone (DESYREL) 100 MG tablet Take 100 mg by mouth 3 (three) times daily after meals. 08/19/17   [provider]    Physical Exam: Vitals:   04/30/18 0600 04/30/18 0630 04/30/18 0734 04/30/18 0830  BP: (!) 185/128 (!) 169/116  (!) 119/97  Pulse: 100 92 (!) 103   Resp: (!) 23 (!) 21 (!) 36 (!) 22  Temp:      TempSrc:      SpO2: 93% 96% 93%   Weight:      Height:       Constitutional: 42 y.o. female tired/unwell-appearing, obese, calm demeanor Eyes: Lids and conjunctivae normal, PERRL ENMT: Mucous membranes are moist. Posterior pharynx clear of any exudate or lesions, diffusely erythematous with +PND. Fair dentition.  Neck: normal, supple, no  masses, no thyromegaly Respiratory: Labored tachypnea, crackles bibasilar without wheezing. Cardiovascular: Regular tachycardia, no murmurs, rubs, or gallops. No carotid bruits. Unable to determine JVD. Trace LE edema. Palpable pedal pulses. Abdomen: Normoactive bowel sounds. No tenderness, obese but non-distended, and no masses palpated. No hepatosplenomegaly. GU: No indwelling catheter Musculoskeletal: No clubbing / cyanosis. No joint deformity upper and lower extremities. Good ROM, no contractures. Normal muscle tone.  Skin: Warm, dry. No rashes, wounds, or ulcers. No significant lesions noted.  Neurologic: CN II-XII grossly intact. Gait narrow based, steady and slow. Speech normal. No focal deficits in motor strength or sensation in all extremities.  Psychiatric: Alert and oriented x3. Normal judgment and insight. Mood euthymic with congruent affect.   Labs on Admission: I have personally reviewed following labs and imaging studies  CBC: Recent Labs  Lab 04/30/18 0350  WBC 11.6*  NEUTROABS 8.9*  HGB 9.1*  HCT 30.6*  MCV 74.5*  PLT 076   Basic Metabolic Panel: Recent Labs  Lab 04/30/18 0350  NA 134*  K 3.8  CL 98  CO2 27  GLUCOSE 163*  BUN 22*  CREATININE 1.15*  CALCIUM 8.6*   GFR: Estimated Creatinine Clearance: 78.7 mL/min (A) (by C-G formula based on SCr of 1.15 mg/dL (H)). Liver Function Tests: Recent Labs  Lab 04/30/18 0350  AST 26  ALT 20  ALKPHOS 68  BILITOT 0.5  PROT 7.3  ALBUMIN 3.3*   No results for input(s): LIPASE, AMYLASE in the last 168 hours. No results for input(s): AMMONIA in the last 168 hours. Coagulation Profile: No results for input(s): INR, PROTIME in the last 168 hours. Cardiac Enzymes: Recent Labs  Lab 04/30/18 0350  TROPONINI 0.04*   BNP (last 3 results) No results for input(s): PROBNP in the last 8760 hours. HbA1C: No results for input(s): HGBA1C in the last 72 hours. CBG: No results for input(s): GLUCAP in the last 168  hours. Lipid Profile: No results for input(s): CHOL, HDL, LDLCALC, TRIG, CHOLHDL, LDLDIRECT in the last 72 hours. Thyroid Function Tests: No results for input(s): TSH, T4TOTAL, FREET4, T3FREE, THYROIDAB in the last 72 hours. Anemia Panel: No results for input(s): VITAMINB12, FOLATE, FERRITIN, TIBC, IRON, RETICCTPCT in the last 72 hours. Urine analysis:  Component Value Date/Time   COLORURINE YELLOW 04/30/2018 0450   APPEARANCEUR HAZY (A) 04/30/2018 0450   LABSPEC 1.028 04/30/2018 0450   PHURINE 5.0 04/30/2018 0450   GLUCOSEU NEGATIVE 04/30/2018 0450   HGBUR MODERATE (A) 04/30/2018 0450   BILIRUBINUR NEGATIVE 04/30/2018 0450   KETONESUR NEGATIVE 04/30/2018 0450   PROTEINUR 100 (A) 04/30/2018 0450   UROBILINOGEN 0.2 04/29/2014 1115   NITRITE NEGATIVE 04/30/2018 0450   LEUKOCYTESUR SMALL (A) 04/30/2018 0450    Recent Results (from the past 240 hour(s))  Blood Culture (routine x 2)     Status: None (Preliminary result)   Collection Time: 04/30/18  3:40 AM  Result Value Ref Range Status   Specimen Description BLOOD  Final   Special Requests NONE  Final   Culture   Final    NO GROWTH < 12 HOURS Performed at [facility], [address] [place], [place] [address]    Report Status PENDING  Incomplete  Blood Culture (routine x 2)     Status: None (Preliminary result)   Collection Time: 04/30/18  3:50 AM  Result Value Ref Range Status   Specimen Description BLOOD  Final   Special Requests NONE  Final   Culture   Final    NO GROWTH < 12 HOURS Performed at [facility], [address], [place], [place] [address]    Report Status PENDING  Incomplete     Radiological Exams on Admission: Dg Chest 2 View  Result Date: 04/30/2018 CLINICAL DATA:  Weakness, cough.  History of CHF. EXAM: CHEST - 2 VIEW COMPARISON:  Chest radiograph Sep 29, 2017 FINDINGS: Cardiac silhouette is moderately enlarged and unchanged. Mediastinal silhouette is unremarkable. Mild pulmonary vascular congestion  without pleural effusion or focal consolidation. No pneumothorax. Large body habitus. Surgical clips in the included right abdomen compatible with cholecystectomy. Mild degenerative changes thoracic spine. IMPRESSION: 1. Stable cardiomegaly and mild pulmonary vascular congestion. Electronically Signed   By: Elon Alas M.D.   On: 04/30/2018 04:46    EKG: Independently reviewed. Sinus tachycardia, vent rate 104 with normal axis, QRS suggestive of LVH, J point elevation in V2-4 stable from prior and T waves flattened in V5 and V6 where they were inverted on priors.  Assessment/Plan Active Problems:   Hypertensive urgency   Acute CHF ([place])   Essential hypertension   Cardiomyopathy due to hypertension ([place])   Acute on chronic combined systolic and diastolic CHF, NYHA class 4 ([facility])   Depression   Diabetes mellitus with complication ([facility])   Iron deficiency anemia   Acute on chronic combined HFrEF: BNP >1k. Previous echo with modestly decreased EF from prior, no definite precipitating event for this event so will check repeat echo. - Diuresis with lasix 60mg  IV BID, having good diuresis thus far. - Daily weights, strict I/O - HF home health needs screening per CM.  - If echo shows worsening or diuresis difficult, pt is followed locally by Dr. Harl Bowie and consultation would be appropriate.  - Continue home entresto, coreg, hydralazine, spironolactone.   Respiratory distress: due to CHF exacerbation, with suspected URI contributing to cough based on exam. No hx obstructive lung dz Dx, though has albuterol at home. - Antitussives - Diuresis as above - Supplemental oxygen if needed to maintain SpO2 >90%  HTN with hypertensive urgency: Chronically poorly-controlled, primary etiology of cardiomyopathy.  - Continue home medications for now, give this AM, and give prns for improved control if needed.   Troponin elevation: Due to demand in setting of  hypertensive urgency, acute CHF. No chest pain  or ECG changes.  - Continue NTG prn, no further evaluation planned at this time. OSA:  - CPAP  Iron deficiency anemia: Followed by hematology.  - Monitor  IDT2DM with peripheral neuropathy:  - Plan to redose lantus this PM based on SSI usage through today.  - Hold metformin - Continue gabapentin  DVT prophylaxis: Lovenox  Code Status: Full  Family Communication: None at bedside Disposition Plan: Uncertain Consults called: None  Admission status: Inpatient  The appropriate admission status for this patient is INPATIENT. Inpatient status is judged to be reasonable and necessary in order to provide the required intensity of service to ensure the patient's safety. The patient's presenting symptoms, physical exam findings, and initial radiographic and laboratory data in the context of their chronic comorbidities is felt to place them at high risk for further clinical deterioration. Furthermore, it is not anticipated that the patient will be medically stable for discharge from the hospital within 2 midnights of admission. The following factors support the admission status of inpatient.    The patient's presenting symptoms include dyspnea, orthopnea, severe cough, edema.  The worrisome physical exam findings include peripheral edema, obesity, tachypnea, crackles, severe range hypertension.  The initial radiographic and laboratory data are worrisome because of elevated BNP, vascular congestion on CXR, troponin elevation.  The chronic co-morbidities include obesity, OSA, IDT2DM, poorly controlled HTN, CHF.  Patient requires inpatient status due to high intensity of service, high risk for further deterioration and high frequency of surveillance required.  I certify that at the point of admission it is my clinical judgment that the patient will require inpatient hospital care spanning beyond 2 midnights from the point of admission.      Patrecia Pour, MD [facility] [email or web address] Password [facility] 04/30/2018, 8:49 AM

## 2018-04-30 NOTE — Progress Notes (Signed)
Patient admitted to 307. She is awake, oriented, and able to move herself into bed. Her vitals are stable at this time. Will continue to monitor.

## 2018-04-30 NOTE — ED Provider Notes (Signed)
[facility] EMERGENCY DEPARTMENT Provider Note   CSN: [id number] Arrival date & time: 04/30/18  0304     History   Chief Complaint Chief Complaint  Patient presents with  . Cough    HPI Chelsey Santiago is a 42 y.o. female.  The history is provided by the patient.  Cough  This is a recurrent problem. The current episode started more than 2 days ago. The problem occurs every few minutes. The problem has been gradually worsening. The cough is productive of sputum. There has been no fever. Associated symptoms include rhinorrhea, sore throat and shortness of breath. Pertinent negatives include no chest pain. She has tried nothing for the symptoms. The treatment provided no relief. She is not a smoker.    Past Medical History:  Diagnosis Date  . Anemia    H&H of 10.6/33 and 07/2008 and 11.9/35 and 09/2010  . Anxiety   . CHF (congestive heart failure) ([place])    a. EF 40-45% by echo in 12/2015 with cath showing normal cors  . Depression with anxiety   . Diabetes mellitus without complication ([place])   . Enlarged heart   . Hypertension   . Hypertensive heart disease 2009   Pulmonary edema postpartum; mild to moderate mitral regurgitation when hospitalized for CHF in 2009; Echocardiogram in 12/2009-no MR and normal EF; normal CXR in 09/2010  . Migraine headache   . Miscarriage 03/19/2013  . Obesity 04/16/2009  . Osteoarthritis, knee 03/29/2011  . Preeclampsia   . Pregnant   . Pulmonary edema   . Sleep apnea   . Threatened abortion in early pregnancy 03/15/2013    Patient Active Problem List   Diagnosis Date Noted  . Constipation 10/17/2017  . Bad headache   . CHF exacerbation ([place]) 09/29/2017  . Iron deficiency anemia 05/16/2017  . Vitamin D deficiency 11/25/2016  . Iron deficiency 11/25/2016  . History of acute myocardial infarction 10/05/2016  . CHF (congestive heart failure) ([place]) 05/06/2016  . Diabetes mellitus with complication ([place]) 01/75/1025  . Leukocytosis  05/06/2016  . Neuropathy 05/06/2016  . Depression 04/16/2016  . Chronic tension-type headache, not intractable 04/16/2016  . AKI (acute kidney injury) ([place])   . Hyperkalemia   . Nonischemic cardiomyopathy ([place])   . Acute on chronic combined systolic and diastolic ACC/AHA stage C congestive heart failure ([place]) 04/03/2016  . Acute on chronic combined systolic and diastolic CHF, NYHA class 4 ([place]) 04/03/2016  . Hypertensive emergency 04/03/2016  . Cardiomyopathy due to hypertension ([place]) 12/22/2015  . Normal coronary arteries 12/22/2015  . Troponin level elevated 12/22/2015  . NSTEMI (non-ST elevated myocardial infarction) ([place]) 12/20/2015  . Dental infection 10/10/2015  . Chest pain 09/05/2015  . Systolic CHF, chronic ([place]) 09/05/2015  . LLQ pain   . Type 2 diabetes mellitus without complication ([place]) 85/27/7824  . Essential hypertension   . 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 Hypertensive urgency 04/22/2014  . Acute CHF ([place]) 04/22/2014  . S/P cesarean section 04/11/2014  . Acute pulmonary edema ([place]) 04/11/2014  . Postoperative anemia 04/11/2014  . Elevated serum creatinine 04/11/2014  . Preeclampsia, severe 04/09/2014  . Pre-eclampsia superimposed on chronic hypertension, antepartum 04/08/2014  . Dyspnea 04/08/2014  . Polyhydramnios in third trimester, antepartum 03/14/2014  . Abnormal maternal glucose tolerance, antepartum 03/11/2014  . High-risk pregnancy 03/11/2014  . Pre-existing essential hypertension complicating pregnancy 23/53/6144  . Impaired glucose tolerance during pregnancy, antepartum 11/27/2013  . Leiomyoma of uterus 11/22/2013  . History of gestational diabetes in prior pregnancy, currently  pregnant in first trimester 11/22/2013  . Hx of preeclampsia, prior pregnancy, currently pregnant 11/22/2013  . Short interval between pregnancies affecting pregnancy, antepartum 11/22/2013  . Supervision of high-risk pregnancy of elderly primigravida (>= 36 years old at  delivery), third trimester 11/22/2013  . Miscarriage 03/19/2013  . Major depressive disorder, single episode, unspecified 09/27/2011  . Hypertension   . Hypertensive cardiovascular disease   . Microcytic anemia   . Osteoarthrosis involving lower leg 03/29/2011  . OSA on CPAP 12/09/2009  . Morbid obesity ([place]) 04/16/2009    Past Surgical History:  Procedure Laterality Date  . BREAST REDUCTION SURGERY  2002  . CARDIAC CATHETERIZATION N/A 12/22/2015   Procedure: Left Heart Cath and Coronary Angiography;  Surgeon: Peter M Martinique, MD;  Location: [facility];  Service: Cardiovascular;  Laterality: N/A;  . CESAREAN SECTION N/A 04/09/2014   Procedure: CESAREAN SECTION;  Surgeon: Mora Bellman, MD;  Location: [facility];  Service: Obstetrics;  Laterality: N/A;  . CHOLECYSTECTOMY       OB History    Gravida  11   Para  6   Term  5   Preterm  1   AB  5   Living  6     SAB  3   TAB  2   Ectopic      Multiple  0   Live Births  6            Home Medications    Prior to Admission medications   Medication Sig Start Date End Date Taking? Authorizing Provider  albuterol (PROVENTIL HFA;VENTOLIN HFA) 108 (90 Base) MCG/ACT inhaler Inhale 1 puff into the lungs every 6 (six) hours as needed for wheezing or shortness of breath.    [provider]  aspirin EC 81 MG tablet Take 81 mg by mouth daily.    [provider]  carvedilol (COREG) 25 MG tablet Take 2 tablets (50 mg total) by mouth 2 (two) times daily with a meal. 12/06/17   Strader, [place] M, PA-C  citalopram (CELEXA) 20 MG tablet Take 20 mg by mouth daily. 08/19/17   [provider]  gabapentin (NEURONTIN) 600 MG tablet Take 600 mg by mouth 3 (three) times daily.     [provider]  hydrALAZINE (APRESOLINE) 100 MG tablet Take 1 tablet (100 mg total) by mouth 3 (three) times daily. 07/12/17   Arnoldo Lenis, MD  HYDROcodone-acetaminophen (NORCO/VICODIN) 5-325 MG tablet TAKE 1 TABLET  BY MOUTH EVERY 4 HOURS AS NEEDED FOR ACUTE PAIN. MAX 5 PER DAY. 01/10/18   Sanjuana Kava, MD  insulin glargine (LANTUS) 100 UNIT/ML injection Inject 30 Units at bedtime into the skin.     [provider]  insulin lispro (HUMALOG) 100 UNIT/ML injection Inject into the skin 3 (three) times daily before meals.    [provider]  Lancets (FREESTYLE) lancets Use as instructed 04/09/16   Arrien, Jimmy Picket, MD  lubiprostone [facility]) 24 MCG capsule Take 1 capsule (24 mcg total) by mouth 2 (two) times daily with a meal. 10/17/17   Mahala Menghini, PA-C  meloxicam (MOBIC) 15 MG tablet Take 1 tablet (15 mg total) by mouth daily. 06/08/17   Sanjuana Kava, MD  metFORMIN (GLUCOPHAGE) 500 MG tablet Take 500 mg 2 (two) times daily with a meal by mouth.    [provider]  mupirocin ointment (BACTROBAN) 2 % Place 1 application into the nose 2 (two) times daily. 09/30/17   Lorella Nimrod, MD  naproxen (  NAPROSYN) 500 MG tablet Take 500 mg by mouth 2 (two) times daily with a meal.    [provider]  nitroGLYCERIN (NITROSTAT) 0.4 MG SL tablet Place 1 tablet (0.4 mg total) under the tongue every 5 (five) minutes as needed for chest pain. 09/05/15   Kathie Dike, MD  omeprazole (PRILOSEC) 40 MG capsule Take 40 mg by mouth daily.    [provider]  ondansetron (ZOFRAN) 4 MG tablet Take 4 mg by mouth daily as needed for nausea or vomiting.    [provider]  oxyCODONE-acetaminophen (PERCOCET/ROXICET) 5-325 MG tablet Take 1 tablet by mouth every 4 (four) hours as needed for severe pain.    [provider]  potassium chloride SA (K-DUR,KLOR-CON) 20 MEQ tablet Take 1 tablet (20 mEq total) by mouth daily. 05/12/17   Holley Bouche, NP  pyridostigmine (MESTINON) 60 MG tablet Take 60 mg by mouth every 8 (eight) hours.    [provider]  sacubitril-valsartan (ENTRESTO) 97-103 MG Take 1 tablet by mouth 2 (two) times daily. 04/09/16   Arrien,  Jimmy Picket, MD  spironolactone (ALDACTONE) 50 MG tablet Take 1 tablet (50 mg total) by mouth daily. 09/30/17 12/29/17  Lorella Nimrod, MD  torsemide (DEMADEX) 20 MG tablet Please take 40 mg oral every morning and 20 mg every evening. Patient taking differently: Take 20-40 mg 2 (two) times daily by mouth. Please take 40 mg oral every morning and 20 mg every evening. 05/10/16   Elgergawy, Silver Huguenin, MD  traZODone (DESYREL) 100 MG tablet Take 100 mg by mouth 3 (three) times daily after meals. 08/19/17   [provider]    Family History Family History  Problem Relation Age of Onset  . Diabetes Mother 32  . Heart disease Mother   . Hyperlipidemia Paternal Grandfather   . Hypertension Paternal Grandfather   . Heart disease Father   . Hypertension Father   . Heart disease Maternal Grandmother 60  . ADD / ADHD Son   . Hypertension Maternal Uncle   . Heart attack Brother   . Sudden death Neg Hx   . Colon cancer Neg Hx   . Celiac disease Neg Hx   . Inflammatory bowel disease Neg Hx     Social History Social History   Tobacco Use  . Smoking status: Never Smoker  . Smokeless tobacco: Never Used  Substance Use Topics  . Alcohol use: Yes    Comment: occ  . Drug use: No     Allergies   Diclofenac; Tramadol; Vicodin [hydrocodone-acetaminophen]; Acetaminophen; and Hydrocodone bitartrate   Review of Systems Review of Systems  HENT: Positive for rhinorrhea and sore throat.   Respiratory: Positive for cough and shortness of breath.   Cardiovascular: Negative for chest pain.  All other systems reviewed and are negative.    Physical Exam Updated Vital Signs BP (!) 185/128   Pulse 100   Temp 99.7 F (37.6 C) (Oral)   Resp (!) 23   Ht 5\' 6"  (1.676 m)   Wt 106.6 kg   LMP 04/23/2018   SpO2 93%   BMI 37.93 kg/m   Physical Exam Vitals signs and nursing note reviewed.  Constitutional:      Appearance: She is well-developed.  HENT:     Head: Normocephalic and  atraumatic.     Nose: Nose normal.  Neck:     Musculoskeletal: Normal range of motion.  Cardiovascular:     Rate and Rhythm: Regular rhythm. Tachycardia present.  Pulmonary:  Effort: Tachypnea present. No respiratory distress.     Breath sounds: No stridor. No wheezing.  Abdominal:     General: There is no distension.  Musculoskeletal:     Right lower leg: Edema (mild, non pitting) present.     Left lower leg: Edema (mild, non pitting) present.  Skin:    General: Skin is warm and dry.  Neurological:     General: No focal deficit present.     Mental Status: She is alert.      ED Treatments / Results  Labs (all labs ordered are listed, but only abnormal results are displayed) Labs Reviewed  COMPREHENSIVE METABOLIC PANEL - Abnormal; Notable for the following components:      Result Value   Sodium 134 (*)    Glucose, Bld 163 (*)    BUN 22 (*)    Creatinine, Ser 1.15 (*)    Calcium 8.6 (*)    Albumin 3.3 (*)    GFR calc non Af Amer 59 (*)    All other components within normal limits  CBC WITH DIFFERENTIAL/PLATELET - Abnormal; Notable for the following components:   WBC 11.6 (*)    Hemoglobin 9.1 (*)    HCT 30.6 (*)    MCV 74.5 (*)    MCH 22.1 (*)    MCHC 29.7 (*)    Neutro Abs 8.9 (*)    All other components within normal limits  URINALYSIS, ROUTINE W REFLEX MICROSCOPIC - Abnormal; Notable for the following components:   APPearance HAZY (*)    Hgb urine dipstick MODERATE (*)    Protein, ur 100 (*)    Leukocytes, UA SMALL (*)    WBC, UA >50 (*)    Bacteria, UA RARE (*)    All other components within normal limits  TROPONIN I - Abnormal; Notable for the following components:   Troponin I 0.04 (*)    All other components within normal limits  BRAIN NATRIURETIC PEPTIDE - Abnormal; Notable for the following components:   B Natriuretic Peptide 1,008.0 (*)    All other components within normal limits  CULTURE, BLOOD (ROUTINE X 2)  CULTURE, BLOOD (ROUTINE X 2)    URINE CULTURE  INFLUENZA PANEL BY PCR (TYPE A & B)  I-STAT CG4 LACTIC ACID, ED    EKG EKG Interpretation  Date/Time:  Sunday April 30 2018 03:43:08 EST Ventricular Rate:  104 PR Interval:    QRS Duration: 92 QT Interval:  366 QTC Calculation: 482 R Axis:   7 Text Interpretation:  Sinus tachycardia Nonspecific T abnormalities, lateral leads similar to previous ecgs Confirmed by Merrily Pew [phone]) on 04/30/2018 6:12:18 AM   Radiology Dg Chest 2 View  Result Date: 04/30/2018 CLINICAL DATA:  Weakness, cough.  History of CHF. EXAM: CHEST - 2 VIEW COMPARISON:  Chest radiograph Sep 29, 2017 FINDINGS: Cardiac silhouette is moderately enlarged and unchanged. Mediastinal silhouette is unremarkable. Mild pulmonary vascular congestion without pleural effusion or focal consolidation. No pneumothorax. Large body habitus. Surgical clips in the included right abdomen compatible with cholecystectomy. Mild degenerative changes thoracic spine. IMPRESSION: 1. Stable cardiomegaly and mild pulmonary vascular congestion. Electronically Signed   By: Elon Alas M.D.   On: 04/30/2018 04:46    Procedures Procedures (including critical care time)  Medications Ordered in ED Medications  furosemide (LASIX) injection 80 mg (80 mg Intravenous Given 04/30/18 0505)  benzonatate (TESSALON) capsule 100 mg (100 mg Oral Given 04/30/18 0540)  labetalol (NORMODYNE,TRANDATE) injection 5 mg (5 mg Intravenous Given 04/30/18  0548)     Initial Impression / Assessment and Plan / ED Course  I have reviewed the triage vital signs and the nursing notes.  Pertinent labs & imaging results that were available during my care of the patient were reviewed by me and considered in my medical decision making (see chart for details).    Influenza vs pneumonia vs chf.   Workup c/w chf exacerbation. Possible UTI? No symptoms, culture added on.  Lasix given. Labetalol for BP.   Admit.   Final Clinical  Impressions(s) / ED Diagnoses   Final diagnoses:  Acute pulmonary edema [facility])    ED Discharge Orders    None       Jesse Nosbisch, Corene Cornea, MD 04/30/18 [phone]

## 2018-04-30 NOTE — ED Triage Notes (Signed)
Pt states that she started feeling bad last week with body aches and "holding fluid" pt states that she doubled her fluid pill and that the swelling is better but that she started coughing a few days ago, cough is productive with clear sputum, denies any fever,

## 2018-05-01 LAB — BASIC METABOLIC PANEL
Anion gap: 9 (ref 5–15)
BUN: 21 mg/dL — ABNORMAL HIGH (ref 6–20)
CO2: 29 mmol/L (ref 22–32)
Calcium: 8.3 mg/dL — ABNORMAL LOW (ref 8.9–10.3)
Chloride: 99 mmol/L (ref 98–111)
Creatinine, Ser: 1.15 mg/dL — ABNORMAL HIGH (ref 0.44–1.00)
GFR calc Af Amer: >60 mL/min (ref >60)
GFR calc non Af Amer: 59 mL/min — ABNORMAL LOW (ref >60)
Glucose, Bld: 196 mg/dL — ABNORMAL HIGH (ref 70–99)
Potassium: 3.8 mmol/L (ref 3.5–5.1)
Sodium: 137 mmol/L (ref 135–145)

## 2018-05-01 LAB — GLUCOSE, CAPILLARY
GLUCOSE-CAPILLARY: 205 mg/dL — AB (ref 70–99)
GLUCOSE-CAPILLARY: 218 mg/dL — AB (ref 70–99)
Glucose-Capillary: 197 mg/dL — ABNORMAL HIGH (ref 70–99)

## 2018-05-01 LAB — CBC
HCT: 30.4 % — ABNORMAL LOW (ref 36.0–46.0)
Hemoglobin: 9 g/dL — ABNORMAL LOW (ref 12.0–15.0)
MCH: 22 pg — ABNORMAL LOW (ref 26.0–34.0)
MCHC: 29.6 g/dL — ABNORMAL LOW (ref 30.0–36.0)
MCV: 74.3 fL — ABNORMAL LOW (ref 80.0–100.0)
Platelets: 283 10*3/uL (ref 150–400)
RBC: 4.09 MIL/uL (ref 3.87–5.11)
RDW: 14.6 % (ref 11.5–15.5)
WBC: 11.2 10*3/uL — ABNORMAL HIGH (ref 4.0–10.5)
nRBC: 0 % (ref 0.0–0.2)

## 2018-05-01 MED ORDER — INSULIN ASPART 100 UNIT/ML ~~LOC~~ SOLN
3.0000 [IU] | Freq: Three times a day (TID) | SUBCUTANEOUS | Status: DC
  Administered 2018-05-01 – 2018-05-04 (×9): 3 [IU] via SUBCUTANEOUS

## 2018-05-01 MED ORDER — FUROSEMIDE 10 MG/ML IJ SOLN
80.0000 mg | Freq: Two times a day (BID) | INTRAMUSCULAR | Status: DC
  Administered 2018-05-01 – 2018-05-03 (×4): 80 mg via INTRAVENOUS
  Filled 2018-05-01 (×4): qty 8

## 2018-05-01 MED ORDER — INSULIN GLARGINE 100 UNIT/ML ~~LOC~~ SOLN
18.0000 [IU] | Freq: Every day | SUBCUTANEOUS | Status: DC
  Administered 2018-05-01 – 2018-05-03 (×3): 18 [IU] via SUBCUTANEOUS
  Filled 2018-05-01 (×4): qty 0.18

## 2018-05-01 MED ORDER — LOPERAMIDE HCL 2 MG PO CAPS
4.0000 mg | ORAL_CAPSULE | Freq: Once | ORAL | Status: AC
  Administered 2018-05-01: 4 mg via ORAL
  Filled 2018-05-01: qty 2

## 2018-05-01 NOTE — Plan of Care (Signed)
  Problem: Education: Goal: Ability to demonstrate management of disease process will improve 05/01/2018 1317 by Rance Muir, RN Outcome: Progressing 05/01/2018 1316 by Rance Muir, RN Outcome: Progressing Goal: Ability to verbalize understanding of medication therapies will improve 05/01/2018 1317 by Rance Muir, RN Outcome: Progressing 05/01/2018 1316 by Rance Muir, RN Outcome: Progressing Goal: Individualized Educational Video(s) 05/01/2018 1317 by Rance Muir, RN Outcome: Progressing 05/01/2018 1316 by Rance Muir, RN Outcome: Progressing   Problem: Activity: Goal: Capacity to carry out activities will improve 05/01/2018 1317 by Rance Muir, RN Outcome: Progressing 05/01/2018 1316 by Rance Muir, RN Outcome: Progressing   Problem: Cardiac: Goal: Ability to achieve and maintain adequate cardiopulmonary perfusion will improve 05/01/2018 1317 by Rance Muir, RN Outcome: Progressing 05/01/2018 1316 by Rance Muir, RN Outcome: Progressing

## 2018-05-01 NOTE — Progress Notes (Signed)
Inpatient Diabetes Program Recommendations  AACE/ADA: New Consensus Statement on Inpatient Glycemic Control (2019)  Target Ranges:  Prepandial:   less than 140 mg/dL      Peak postprandial:   less than 180 mg/dL (1-2 hours)      Critically ill patients:  140 - 180 mg/dL   Results for Chelsey Santiago, Chelsey Santiago (MRN [id number]) as of 05/01/2018 09:29  Ref. Range 04/30/2018 11:29 04/30/2018 16:16 04/30/2018 21:05 05/01/2018 07:55  Glucose-Capillary Latest Ref Range: 70 - 99 mg/dL 195 (H) 205 (H) 218 (H) 197 (H)   Review of Glycemic Control  Diabetes history: DM2 Outpatient Diabetes medications: Lantus 30 units QHS, Humalog TID with meals, Metformin 500 mg BID Current orders for Inpatient glycemic control: Lantus 15 units QHS, Novolog 0-15 units TID with meals, Novolog 0-5 units QHS  Inpatient Diabetes Program Recommendations:  Insulin - Basal: Please consider increasing Lantus to 18 units QHS. Insulin - Meal Coverage: Please consider ordering Novolog 3 units TID with meals for meal coverage if patient eats at least 50% of meals.  Thanks, Barnie Alderman, RN, MSN, CDE Diabetes Coordinator Inpatient Diabetes Program [phone] (Team Pager from 8am to 5pm)

## 2018-05-01 NOTE — Progress Notes (Addendum)
PROGRESS NOTE  Chelsey Santiago  YJE:[id number] DOB: 22-Feb-1976 DOA: 04/30/2018 PCP: Rosita Fire, MD   Brief Narrative: Chelsey Santiago is a 42 y.o. female with a history of CHF, HTN, obesity, IDDM who presented with a few days of cough and generally "feeling bad." Last week she noticed increased leg swelling and trouble breathing worse when she was laying flat or exerting herself so she took extra doses of torsemide. This helped the swelling somewhat, though the orthopnea and cough worsened starting 3 days prior to arrival despite taking home medications. She's had clear rhinorrhea and increasing cough. In the ED she was coughing, dyspneic, worse with walking to bathroom, severe range HTN improved with IV medication. Cr 1.15, BNP >1k, troponin 0.04, ECG without acute ischemic changes. CXR with cardiomegaly and vascular congestion consistent with CHF. WBC 11.6. She was admitted for acute CHF and symptoms consistent with viral URI. She's had intermittent fevers though cultures have been no growth to date.  Assessment & Plan: Principal Problem:   Acute on chronic combined systolic and diastolic CHF, NYHA class 4 (HCC) Active Problems:   Morbid obesity (HCC)   OSA on CPAP   Microcytic anemia   Hypertensive urgency   Acute CHF ([place])   Essential hypertension   Cardiomyopathy due to hypertension (HCC)   Troponin level elevated   Depression   Diabetes mellitus with complication (HCC)   Leukocytosis   Iron deficiency anemia  Acute on chronic combined HFrEF: BNP >1k. Repeat echo with moderate LVH, EF improved to 45-50% with G2DD, no regional wall motion abnormalities noted. - Plan to augment lasix from 60mg  IV BID to 80mg  IV BID - Daily weights, strict I/O. BMP stable, weight stable, about 800cc out in past 24 hours. - Continue home entresto, coreg, hydralazine, spironolactone.   Respiratory distress: due to CHF exacerbation, with suspected URI contributing to cough based on exam. No hx  obstructive lung dz Dx, though has albuterol at home. - Antitussives titrated with improvement - Diuresis as above - Supplemental oxygen if needed to maintain SpO2 >90%  HTN with hypertensive urgency: Chronically poorly-controlled, primary etiology of cardiomyopathy.  - Urgency resolved by giving home medications which will continue.   Troponin elevation: Due to demand in setting of hypertensive urgency, acute CHF. No chest pain or ECG changes.  - Continue NTG prn, no further evaluation planned at this time.  OSA:  - CPAP  Iron deficiency anemia: Followed by hematology.  - Monitor  IDT2DM with peripheral neuropathy:  - Increase insulins both lantus and add mealtime. Above inpatient goal.   - Hold metformin - Continue gabapentin  Morbid obesity: BMI 38.  - Lifestyle modifications recommended.  Watery diarrhea:  - Continue empiric precautions pending CDiff.  DVT prophylaxis: Lovenox Code Status: Full Family Communication: None at bedside Disposition Plan: Home once euvolemic, respiratory status improved.  Consultants:   None  Procedures:   None  Antimicrobials:  None   Subjective: Had 4 episodes of low volume watery diarrhea overnight but has had none since. Denies abdominal pain, nausea or vomiting. Cough is improved, no itching with cough syrup or other reactions noted. Dyspnea is stable and leg swelling is unchanged.   Objective: Vitals:   04/30/18 2204 05/01/18 0400 05/01/18 0529 05/01/18 1438  BP: (!) 152/105  128/78 110/61  Pulse: 86  90 83  Resp: 20  20 18   Temp: 99.6 F (37.6 C) (!) 100.7 F (38.2 C) 99.5 F (37.5 C) (!) 100.8 F (38.2 C)  TempSrc:  Oral  Oral Oral  SpO2:   96% 94%  Weight:   107 kg   Height:        Intake/Output Summary (Last 24 hours) at 05/01/2018 1452 Last data filed at 05/01/2018 0900 Gross per 24 hour  Intake 240 ml  Output 801 ml  Net -561 ml   Filed Weights   04/30/18 0320 04/30/18 1309 05/01/18 0529  Weight:  106.6 kg 107.2 kg 107 kg    Gen: 42 y.o. female in no distress Pulm: Non-labored breathing with scant bibasilar crackles.  CV: Regular rate and rhythm. No murmur, rub, or gallop. No JVD, trace pedal edema. GI: Abdomen soft, non-tender, non-distended, with normoactive bowel sounds. No organomegaly or masses felt. Ext: Warm, no deformities Skin: No rashes, lesions or ulcers Neuro: Alert and oriented. No focal neurological deficits. Psych: Judgement and insight appear normal. Mood & affect appropriate.   Data Reviewed: I have personally reviewed following labs and imaging studies  CBC: Recent Labs  Lab 04/30/18 0350 05/01/18 0457  WBC 11.6* 11.2*  NEUTROABS 8.9*  --   HGB 9.1* 9.0*  HCT 30.6* 30.4*  MCV 74.5* 74.3*  PLT 240 710   Basic Metabolic Panel: Recent Labs  Lab 04/30/18 0350 05/01/18 0457  NA 134* 137  K 3.8 3.8  CL 98 99  CO2 27 29  GLUCOSE 163* 196*  BUN 22* 21*  CREATININE 1.15* 1.15*  CALCIUM 8.6* 8.3*   GFR: Estimated Creatinine Clearance: 78.9 mL/min (A) (by C-G formula based on SCr of 1.15 mg/dL (H)). Liver Function Tests: Recent Labs  Lab 04/30/18 0350  AST 26  ALT 20  ALKPHOS 68  BILITOT 0.5  PROT 7.3  ALBUMIN 3.3*   No results for input(s): LIPASE, AMYLASE in the last 168 hours. No results for input(s): AMMONIA in the last 168 hours. Coagulation Profile: No results for input(s): INR, PROTIME in the last 168 hours. Cardiac Enzymes: Recent Labs  Lab 04/30/18 0350  TROPONINI 0.04*   BNP (last 3 results) No results for input(s): PROBNP in the last 8760 hours. HbA1C: No results for input(s): HGBA1C in the last 72 hours. CBG: Recent Labs  Lab 04/30/18 1129 04/30/18 1616 04/30/18 2105 05/01/18 0755  GLUCAP 195* 205* 218* 197*   Lipid Profile: No results for input(s): CHOL, HDL, LDLCALC, TRIG, CHOLHDL, LDLDIRECT in the last 72 hours. Thyroid Function Tests: No results for input(s): TSH, T4TOTAL, FREET4, T3FREE, THYROIDAB in the  last 72 hours. Anemia Panel: No results for input(s): VITAMINB12, FOLATE, FERRITIN, TIBC, IRON, RETICCTPCT in the last 72 hours. Urine analysis:    Component Value Date/Time   COLORURINE YELLOW 04/30/2018 0450   APPEARANCEUR HAZY (A) 04/30/2018 0450   LABSPEC 1.028 04/30/2018 0450   PHURINE 5.0 04/30/2018 0450   GLUCOSEU NEGATIVE 04/30/2018 0450   HGBUR MODERATE (A) 04/30/2018 0450   BILIRUBINUR NEGATIVE 04/30/2018 0450   KETONESUR NEGATIVE 04/30/2018 0450   PROTEINUR 100 (A) 04/30/2018 0450   UROBILINOGEN 0.2 04/29/2014 1115   NITRITE NEGATIVE 04/30/2018 0450   LEUKOCYTESUR SMALL (A) 04/30/2018 0450   Recent Results (from the past 240 hour(s))  Blood Culture (routine x 2)     Status: None (Preliminary result)   Collection Time: 04/30/18  3:40 AM  Result Value Ref Range Status   Specimen Description BLOOD  Final   Special Requests NONE  Final   Culture   Final    NO GROWTH 1 DAY Performed at [facility], [address], [place], [place] [address]  Report Status PENDING  Incomplete  Blood Culture (routine x 2)     Status: None (Preliminary result)   Collection Time: 04/30/18  3:50 AM  Result Value Ref Range Status   Specimen Description BLOOD RIGHT ARM  Final   Special Requests   Final    BOTTLES DRAWN AEROBIC AND ANAEROBIC Blood Culture adequate volume   Culture   Final    NO GROWTH 1 DAY Performed at [facility], [address] [place], [place] [address]    Report Status PENDING  Incomplete      Radiology Studies: Dg Chest 2 View  Result Date: 04/30/2018 CLINICAL DATA:  Weakness, cough.  History of CHF. EXAM: CHEST - 2 VIEW COMPARISON:  Chest radiograph Sep 29, 2017 FINDINGS: Cardiac silhouette is moderately enlarged and unchanged. Mediastinal silhouette is unremarkable. Mild pulmonary vascular congestion without pleural effusion or focal consolidation. No pneumothorax. Large body habitus. Surgical clips in the included right abdomen compatible with cholecystectomy.  Mild degenerative changes thoracic spine. IMPRESSION: 1. Stable cardiomegaly and mild pulmonary vascular congestion. Electronically Signed   By: Elon Alas M.D.   On: 04/30/2018 04:46    Scheduled Meds: . aspirin EC  81 mg Oral Daily  . benzonatate  200 mg Oral TID  . carvedilol  50 mg Oral BID WC  . citalopram  20 mg Oral Daily  . enoxaparin (LOVENOX) injection  50 mg Subcutaneous Q24H  . furosemide  60 mg Intravenous BID  . gabapentin  600 mg Oral TID  . hydrALAZINE  100 mg Oral TID  . insulin aspart  0-15 Units Subcutaneous TID WC  . insulin aspart  0-5 Units Subcutaneous QHS  . insulin glargine  15 Units Subcutaneous QHS  . potassium chloride  40 mEq Oral Daily  . pyridostigmine  60 mg Oral Q8H  . sacubitril-valsartan  1 tablet Oral BID  . sodium chloride flush  3 mL Intravenous Q12H  . spironolactone  50 mg Oral Daily  . traZODone  100 mg Oral QHS   Continuous Infusions:   LOS: 1 day   Time spent: 35 minutes.  Patrecia Pour, MD [facility] [email or web address] Password [email or web address] 05/01/2018, 2:52 PM

## 2018-05-01 NOTE — Care Management Note (Signed)
Case Management Note  Patient Details  Name: Chelsey Santiago MRN: [id number] Date of Birth: 1975-10-04  Subjective/Objective:        42 y.o., CHF, URI. From home with children.    Independent.  PCP: Rosita Fire, MD Outpatient Specialists: Cardiology, Dr. Harl Bowie. Has Medicaid.    Action/Plan: Anticipate DC home tomorrow.   Expected Discharge Date:    05/02/2018              Expected Discharge Plan:  Home/Self Care  In-House Referral:     Discharge planning Services  CM Consult  Post Acute Care Choice:  NA Choice offered to:  NA  DME Arranged:    DME Agency:     HH Arranged:    HH Agency:     Status of Service:  Completed, signed off  If discussed at [employer] of Stay Meetings, dates discussed:    Additional Comments:  Chelsey Santiago, [name], RN 05/01/2018, 1:46 PM

## 2018-05-01 NOTE — Plan of Care (Signed)

## 2018-05-02 ENCOUNTER — Inpatient Hospital Stay (HOSPITAL_COMMUNITY): Payer: Medicaid Other

## 2018-05-02 LAB — EXPECTORATED SPUTUM ASSESSMENT W GRAM STAIN, RFLX TO RESP C

## 2018-05-02 LAB — GLUCOSE, CAPILLARY
GLUCOSE-CAPILLARY: 171 mg/dL — AB (ref 70–99)
Glucose-Capillary: 181 mg/dL — ABNORMAL HIGH (ref 70–99)
Glucose-Capillary: 150 mg/dL — ABNORMAL HIGH (ref 70–99)
Glucose-Capillary: 158 mg/dL — ABNORMAL HIGH (ref 70–99)

## 2018-05-02 LAB — BASIC METABOLIC PANEL
Anion gap: 7 (ref 5–15)
BUN: 24 mg/dL — ABNORMAL HIGH (ref 6–20)
CO2: 29 mmol/L (ref 22–32)
CREATININE: 1.23 mg/dL — AB (ref 0.44–1.00)
Calcium: 8.2 mg/dL — ABNORMAL LOW (ref 8.9–10.3)
Chloride: 99 mmol/L (ref 98–111)
GFR calc non Af Amer: 54 mL/min — ABNORMAL LOW (ref >60)
Glucose, Bld: 170 mg/dL — ABNORMAL HIGH (ref 70–99)
Potassium: 3.5 mmol/L (ref 3.5–5.1)
Sodium: 135 mmol/L (ref 135–145)

## 2018-05-02 LAB — MRSA PCR SCREENING: MRSA by PCR: POSITIVE — AB

## 2018-05-02 LAB — HIV ANTIBODY (ROUTINE TESTING W REFLEX): HIV Screen 4th Generation wRfx: NONREACTIVE

## 2018-05-02 MED ORDER — IPRATROPIUM-ALBUTEROL 0.5-2.5 (3) MG/3ML IN SOLN
3.0000 mL | Freq: Two times a day (BID) | RESPIRATORY_TRACT | Status: DC
  Administered 2018-05-02 – 2018-05-04 (×5): 3 mL via RESPIRATORY_TRACT
  Filled 2018-05-02 (×6): qty 3

## 2018-05-02 MED ORDER — MENTHOL 3 MG MT LOZG: 1.0000 | LOZENGE | OROMUCOSAL | Status: DC | PRN

## 2018-05-02 MED ORDER — SODIUM CHLORIDE 0.9 % IV SOLN
1.0000 g | INTRAVENOUS | Status: DC
  Administered 2018-05-02 – 2018-05-04 (×3): 1 g via INTRAVENOUS
  Filled 2018-05-02: qty 1
  Filled 2018-05-02: qty 10
  Filled 2018-05-02 (×2): qty 1

## 2018-05-02 MED ORDER — SODIUM CHLORIDE 0.9 % IV SOLN
500.0000 mg | INTRAVENOUS | Status: DC
  Administered 2018-05-02 – 2018-05-04 (×3): 500 mg via INTRAVENOUS
  Filled 2018-05-02 (×4): qty 500

## 2018-05-02 NOTE — Progress Notes (Signed)
Patient noted to break out in a sweat throughout night, temp at this time was 98.6. Temp spiked to 101.6 this a.m. noted to be coughing up thick yellow mucous. Notified Dr. Darrick Meigs, new order for ABT's and repeat chest x-ray. Blood cultures previously done. Administered tylenol for fever, will continue to monitor.

## 2018-05-02 NOTE — Progress Notes (Signed)
PROGRESS NOTE  Chelsey Santiago  IRJ:[id number] DOB: July 02, 1975 DOA: 04/30/2018 PCP: Rosita Fire, MD   Brief Narrative: Chelsey Santiago is a 42 y.o. female with a history of CHF, HTN, obesity, IDDM who presented with a few days of cough and generally "feeling bad." Last week she noticed increased leg swelling and trouble breathing worse when she was laying flat or exerting herself so she took extra doses of torsemide. This helped the swelling somewhat, though the orthopnea and cough worsened starting 3 days prior to arrival despite taking home medications. She's had clear rhinorrhea and increasing cough. In the ED she was coughing, dyspneic, worse with walking to bathroom, severe range HTN improved with IV medication. Cr 1.15, BNP >1k, troponin 0.04, ECG without acute ischemic changes. CXR with cardiomegaly and vascular congestion consistent with CHF. WBC 11.6. She was admitted for acute CHF and symptoms consistent with viral URI. Fever curve has trended upward with onset of productive cough. CXR was performed showing atelectasis. Empiric antibiotics are continued for early pneumonia.   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 Repeat echo with moderate LVH, EF improved to 45-50% with G2DD, no regional wall motion abnormalities noted. - Continue lasix 60mg  IV BID today, may be able to convert back to po 12/18. - Daily weights, strict I/O. - Continue home entresto, coreg, hydralazine, spironolactone.   Respiratory distress: due to CHF exacerbation, with suspected URI  contributing to cough based on exam, also early pneumonia. No hx obstructive lung dz Dx, though has albuterol at home.  - Sputum culture, continue antibiotics for early pneumonia.  - Continue to monitor blood cultures.  - Antitussives titrated with improvement - Diuresis as above - Supplemental oxygen if needed to maintain SpO2 >90%  Bronchitis noted on XR and exam. - Schedule breathing treatments. If wheezing continues, may nee steroids but will hold for now.  HTN with hypertensive urgency: Chronically poorly-controlled, primary etiology of cardiomyopathy.  - Urgency resolved by giving home medications which will continue.   Troponin elevation: Due to demand in setting of hypertensive urgency, acute CHF. No chest pain or ECG changes.  - Continue NTG prn, no further evaluation planned at this time.  OSA:  - CPAP  Iron deficiency anemia: Followed by hematology.  - Monitor  IDT2DM with peripheral neuropathy:  - Increase insulins both lantus and add mealtime. Above inpatient goal.   - Hold metformin - Continue gabapentin  Morbid obesity: BMI 38.  - Lifestyle modifications recommended.  Watery diarrhea: Resolved.  - CDiff reports as still pending. Not clinically still having problems. 2 formed stools in the past 24 hours - Continue empiric precautions pending CDiff result.  Stress incontinence with hematuria: [id number], suspect this is contributing. No clinical evidence of urolithiasis.  - Recommend outpatient urology evaluation if hematuria continues.   DVT prophylaxis: Lovenox Code Status: Full Family Communication: None at bedside Disposition Plan: Home once euvolemic, respiratory status improved.  Consultants:   None  Procedures:   None  Antimicrobials:  Ceftriaxone, azithromycin 12/17 >>   Subjective: Fever  curve worsening, up to 101.83F with chills, diaphoresis and cough becoming productive of yellow sputum. 2 formed stools in past 24 hours, no abd pain,  N/V.  Objective: Vitals:   05/01/18 2115 05/02/18 0607 05/02/18 0700 05/02/18 0728  BP: 111/60 131/82    Pulse: 83 85    Resp: 20 20    Temp: 98.9 F (37.2 C) (!) 101.6 F (38.7 C)  100.2 F (37.9 C)  TempSrc: Oral Oral  Oral  SpO2:  96%    Weight:   106.8 kg   Height:        Intake/Output Summary (Last 24 hours) at 05/02/2018 1008 Last data filed at 05/01/2018 2017 Gross per 24 hour  Intake 240 ml  Output -  Net 240 ml   Filed Weights   04/30/18 1309 05/01/18 0529 05/02/18 0700  Weight: 107.2 kg 107 kg 106.8 kg   Gen: 42 y.o. female in no distress Pulm: Nonlabored. Diffuse wheezing with resolution of crackles. CV: Regular rate and rhythm. No murmur, rub, or gallop. No JVD, trace dependent edema. GI: Abdomen soft, non-tender, non-distended, with normoactive bowel sounds.  Ext: Warm, no deformities Skin: No new rashes, lesions or ulcers on visualized skin.  Neuro: Alert and oriented. No focal neurological deficits. Psych: Judgement and insight appear fair. Mood euthymic & affect congruent. Behavior is appropriate.    Data Reviewed: I have personally reviewed following labs and imaging studies  CBC: Recent Labs  Lab 04/30/18 0350 05/01/18 0457  WBC 11.6* 11.2*  NEUTROABS 8.9*  --   HGB 9.1* 9.0*  HCT 30.6* 30.4*  MCV 74.5* 74.3*  PLT 240 062   Basic Metabolic Panel: Recent Labs  Lab 04/30/18 0350 05/01/18 0457 05/02/18 0417  NA 134* 137 135  K 3.8 3.8 3.5  CL 98 99 99  CO2 27 29 29   GLUCOSE 163* 196* 170*  BUN 22* 21* 24*  CREATININE 1.15* 1.15* 1.23*  CALCIUM 8.6* 8.3* 8.2*   GFR: Estimated Creatinine Clearance: 73.6 mL/min (A) (by C-G formula based on SCr of 1.23 mg/dL (H)). Liver Function Tests: Recent Labs  Lab 04/30/18 0350  AST 26  ALT 20  ALKPHOS 68  BILITOT 0.5  PROT 7.3  ALBUMIN 3.3*   No results for input(s): LIPASE, AMYLASE in the last 168 hours. No results for input(s): AMMONIA in the last 168 hours. Coagulation Profile: No  results for input(s): INR, PROTIME in the last 168 hours. Cardiac Enzymes: Recent Labs  Lab 04/30/18 0350  TROPONINI 0.04*   BNP (last 3 results) No results for input(s): PROBNP in the last 8760 hours. HbA1C: No results for input(s): HGBA1C in the last 72 hours. CBG: Recent Labs  Lab 04/30/18 1129 04/30/18 1616 04/30/18 2105 05/01/18 0755 05/02/18 0727  GLUCAP 195* 205* 218* 197* 171*   Lipid Profile: No results for input(s): CHOL, HDL, LDLCALC, TRIG, CHOLHDL, LDLDIRECT in the last 72 hours. Thyroid Function Tests: No results for input(s): TSH, T4TOTAL, FREET4, T3FREE, THYROIDAB in the last 72 hours. Anemia Panel: No results for input(s): VITAMINB12, FOLATE, FERRITIN, TIBC, IRON, RETICCTPCT in the last 72 hours. Urine analysis:    Component Value Date/Time   COLORURINE YELLOW 04/30/2018 0450   APPEARANCEUR HAZY (A) 04/30/2018 0450   LABSPEC 1.028 04/30/2018 0450   PHURINE 5.0 04/30/2018 0450   GLUCOSEU NEGATIVE 04/30/2018 0450   HGBUR MODERATE (A) 04/30/2018 0450   BILIRUBINUR NEGATIVE 04/30/2018 0450   KETONESUR NEGATIVE 04/30/2018 0450   PROTEINUR 100 (A) 04/30/2018 0450   UROBILINOGEN 0.2  04/29/2014 1115   NITRITE NEGATIVE 04/30/2018 0450   LEUKOCYTESUR SMALL (A) 04/30/2018 0450   Recent Results (from the past 240 hour(s))  Blood Culture (routine x 2)     Status: None (Preliminary result)   Collection Time: 04/30/18  3:40 AM  Result Value Ref Range Status   Specimen Description BLOOD RIGHT ARM  Final   Special Requests   Final    BOTTLES DRAWN AEROBIC AND ANAEROBIC Blood Culture adequate volume   Culture   Final    NO GROWTH 2 DAYS Performed at [facility], [address], [place], [place] [address]    Report Status PENDING  Incomplete  Blood Culture (routine x 2)     Status: None (Preliminary result)   Collection Time: 04/30/18  3:50 AM  Result Value Ref Range Status   Specimen Description BLOOD RIGHT ARM  Final   Special Requests   Final    BOTTLES  DRAWN AEROBIC AND ANAEROBIC Blood Culture adequate volume   Culture   Final    NO GROWTH 2 DAYS Performed at [facility], [address], [place], [place] [address]    Report Status PENDING  Incomplete  Urine culture     Status: Abnormal (Preliminary result)   Collection Time: 04/30/18  5:22 AM  Result Value Ref Range Status   Specimen Description   Final    URINE, CLEAN CATCH Performed at [facility], [address] [place], [place] [address]    Special Requests   Final    NONE Performed at [facility], [address] [place], [place] [address]    Culture (A)  Final    10,000 COLONIES/mL STAPHYLOCOCCUS AUREUS SUSCEPTIBILITIES TO FOLLOW Performed at [facility], [place] [address], [place], [place] [address]    Report Status PENDING  Incomplete      Radiology Studies: Dg Chest 2 View  Result Date: 05/02/2018 CLINICAL DATA:  Cough and congestion. EXAM: CHEST - 2 VIEW COMPARISON:  04/30/2018. FINDINGS: Mediastinum and hilar structures normal. Stable cardiomegaly. Mild peribronchial cuffing. Mild right base subsegmental atelectasis. No pleural effusion or pneumothorax. Degenerative changes scoliosis thoracic spine. IMPRESSION: Mild peribronchial cuffing. Bronchitis could not be excluded. Mild right base subsegmental atelectasis. 2.  Stable cardiomegaly.  No pulmonary venous congestion. Electronically Signed   By: [place]   On: 05/02/2018 08:55    Scheduled Meds: . aspirin EC  81 mg Oral Daily  . benzonatate  200 mg Oral TID  . carvedilol  50 mg Oral BID WC  . citalopram  20 mg Oral Daily  . enoxaparin (LOVENOX) injection  50 mg Subcutaneous Q24H  . furosemide  80 mg Intravenous BID  . gabapentin  600 mg Oral TID  . hydrALAZINE  100 mg Oral TID  . insulin aspart  0-15 Units Subcutaneous TID WC  . insulin aspart  0-5 Units Subcutaneous QHS  . insulin aspart  3 Units Subcutaneous TID WC  . insulin glargine  18 Units Subcutaneous QHS  . potassium chloride  40 mEq  Oral Daily  . pyridostigmine  60 mg Oral Q8H  . sacubitril-valsartan  1 tablet Oral BID  . sodium chloride flush  3 mL Intravenous Q12H  . spironolactone  50 mg Oral Daily  . traZODone  100 mg Oral QHS   Continuous Infusions: . azithromycin    . cefTRIAXone (ROCEPHIN)  IV       LOS: 2 days   Time spent: 35 minutes.  Patrecia Pour, MD [facility]  [email or web address] Password [email or web address] 05/02/2018, 10:08 AM

## 2018-05-02 NOTE — Progress Notes (Signed)
Patient placed on Enteric and Contact isolation,patient was educated and given a handout,verbalized understanding. Family informed and educated as well via Telephone. Will continued to monitor patient.

## 2018-05-03 ENCOUNTER — Inpatient Hospital Stay (HOSPITAL_COMMUNITY): Payer: Medicaid Other

## 2018-05-03 LAB — BASIC METABOLIC PANEL
ANION GAP: 8 (ref 5–15)
BUN: 25 mg/dL — ABNORMAL HIGH (ref 6–20)
CHLORIDE: 99 mmol/L (ref 98–111)
CO2: 31 mmol/L (ref 22–32)
Calcium: 8.9 mg/dL (ref 8.9–10.3)
Creatinine, Ser: 1.24 mg/dL — ABNORMAL HIGH (ref 0.44–1.00)
GFR calc Af Amer: >60 mL/min (ref >60)
GFR calc non Af Amer: 54 mL/min — ABNORMAL LOW (ref >60)
Glucose, Bld: 147 mg/dL — ABNORMAL HIGH (ref 70–99)
Potassium: 4.3 mmol/L (ref 3.5–5.1)
Sodium: 138 mmol/L (ref 135–145)

## 2018-05-03 LAB — GLUCOSE, CAPILLARY
Glucose-Capillary: 166 mg/dL — ABNORMAL HIGH (ref 70–99)
Glucose-Capillary: 285 mg/dL — ABNORMAL HIGH (ref 70–99)
Glucose-Capillary: 349 mg/dL — ABNORMAL HIGH (ref 70–99)
Glucose-Capillary: 366 mg/dL — ABNORMAL HIGH (ref 70–99)

## 2018-05-03 LAB — CBC
HEMATOCRIT: 36.3 % (ref 36.0–46.0)
Hemoglobin: 10.5 g/dL — ABNORMAL LOW (ref 12.0–15.0)
MCH: 22.1 pg — ABNORMAL LOW (ref 26.0–34.0)
MCHC: 28.9 g/dL — ABNORMAL LOW (ref 30.0–36.0)
MCV: 76.4 fL — ABNORMAL LOW (ref 80.0–100.0)
Platelets: 329 10*3/uL (ref 150–400)
RBC: 4.75 MIL/uL (ref 3.87–5.11)
RDW: 14.6 % (ref 11.5–15.5)
WBC: 11.1 10*3/uL — ABNORMAL HIGH (ref 4.0–10.5)
nRBC: 0 % (ref 0.0–0.2)

## 2018-05-03 LAB — URINE CULTURE: Culture: 10000 — AB

## 2018-05-03 MED ORDER — CARVEDILOL 12.5 MG PO TABS: 25.0000 mg | ORAL_TABLET | Freq: Two times a day (BID) | ORAL | Status: DC

## 2018-05-03 MED ORDER — CHLORHEXIDINE GLUCONATE CLOTH 2 % EX PADS
6.0000 | MEDICATED_PAD | Freq: Every day | CUTANEOUS | Status: DC
  Administered 2018-05-04: 6 via TOPICAL

## 2018-05-03 MED ORDER — MUPIROCIN 2 % EX OINT
1.0000 "application " | TOPICAL_OINTMENT | Freq: Two times a day (BID) | CUTANEOUS | Status: DC
  Administered 2018-05-03 – 2018-05-04 (×3): 1 via NASAL
  Filled 2018-05-03: qty 22

## 2018-05-03 MED ORDER — BUDESONIDE 0.25 MG/2ML IN SUSP
0.2500 mg | Freq: Two times a day (BID) | RESPIRATORY_TRACT | Status: DC
  Administered 2018-05-03 – 2018-05-04 (×3): 0.25 mg via RESPIRATORY_TRACT
  Filled 2018-05-03 (×3): qty 2

## 2018-05-03 MED ORDER — FUROSEMIDE 10 MG/ML IJ SOLN
80.0000 mg | Freq: Three times a day (TID) | INTRAMUSCULAR | Status: DC
  Administered 2018-05-03 – 2018-05-04 (×3): 80 mg via INTRAVENOUS
  Filled 2018-05-03 (×3): qty 8

## 2018-05-03 MED ORDER — PREDNISONE 20 MG PO TABS
40.0000 mg | ORAL_TABLET | Freq: Two times a day (BID) | ORAL | Status: DC
  Administered 2018-05-03 – 2018-05-04 (×3): 40 mg via ORAL
  Filled 2018-05-03 (×3): qty 2

## 2018-05-03 MED ORDER — SPIRONOLACTONE 25 MG PO TABS
25.0000 mg | ORAL_TABLET | Freq: Every day | ORAL | Status: DC
  Administered 2018-05-04: 25 mg via ORAL
  Filled 2018-05-03: qty 1

## 2018-05-03 NOTE — Progress Notes (Addendum)
PROGRESS NOTE    Chelsey Santiago  LPF:[id number] DOB: [birth year] DOA: 04/30/2018 PCP: Rosita Fire, MD   Brief Narrative:  Chelsey Wanamaker Hairstonis a 42 y.o.femalewith a history ofCHF, HTN, obesity, IDDM who presented with a few days of cough and generally "feeling bad." Last week she noticed increased leg swelling and trouble breathing worse when she was laying flat or exerting herself so she took extra doses of torsemide. This helped the swelling somewhat, though the orthopnea and cough worsened starting 3 days prior to arrival despite taking home medications. She's had clear rhinorrhea and increasing cough. In the ED she was coughing, dyspneic, worse with walking to bathroom, severe range HTN improved with IV medication. Cr 1.15, BNP >1k, troponin 0.04, ECG without acute ischemic changes. CXR with cardiomegaly and vascular congestion consistent with CHF. WBC 11.6. She was admitted for acute CHF and symptoms consistent with viral URI. Fever curve has trended upward with onset of productive cough. CXR was performed showing atelectasis. Empiric antibiotics are continued for early pneumonia.    Assessment & Plan:   Principal Problem:   Acute on chronic combined systolic and diastolic CHF, NYHA class 4 (HCC) Active Problems:   Morbid obesity (HCC)   OSA on CPAP   Microcytic anemia   Hypertensive urgency   Acute CHF ([place])   Essential hypertension   Cardiomyopathy due to hypertension (HCC)   Troponin level elevated   Depression   Diabetes mellitus with complication (HCC)   Leukocytosis   Iron deficiency anemia   Acuteon chronic combined HFrEF:BNP >1k. Repeat echo with moderate LVH, EF improved to 45-50% with G2DD, no regional wall motion abnormalities noted. - Continue IV Lasix 80 mg, but now every 8 hours, today as her weight has actually gone up and her fluid balance appears to be positive - Daily weights, strict I/O. -Hold home Entresto, Coreg, and hydralazine due to soft blood  pressures.  Respiratory distress: due to CHF exacerbation, with suspected acute bronchitis contributing to cough based on exam, also early pneumonia. No hx obstructive lung dz Dx, though has albuterol at home.  - Sputum culture, continue antibiotics for early pneumonia.  - Continue to monitor blood cultures.  - Antitussives titrated with improvement - Diuresis as above - Supplemental oxygen if needed to maintain SpO2 >90% -Add prednisone on Pulmicort today.  No signs of pneumonia on chest x-ray  HTN with hypertensive urgency: Chronically poorly-controlled, primary etiology of cardiomyopathy.  Patient currently having some soft blood pressures and symptomatology. - Urgency resolved by giving home medications which will continue.  -Continue IV diuresis to renal tolerance at hold other blood pressure medications as noted above.  Troponin elevation: Due to demand in setting of hypertensive urgency, acute CHF. No chest pain or ECG changes.  - Continue NTG prn, no further evaluation planned at this time.  OSA:  - CPAP  Iron deficiency anemia: Followed by hematology.  - Monitor  IDT2DM with peripheral neuropathy:  - Increase insulins both lantus and add mealtime. Above inpatient goal.   - Hold metformin - Continue gabapentin  Morbid obesity: BMI 38.  - Lifestyle modifications recommended.  Watery diarrhea: Resolved.  -Discontinue enteric precautions as I do not suspect C. difficile.  Stress incontinence with hematuria: [id number], suspect this is contributing. No clinical evidence of urolithiasis.  - Recommend outpatient urology evaluation if hematuria continues.   DVT prophylaxis: Lovenox Code Status: Full Family Communication: None at bedside Disposition Plan: Home once euvolemic, respiratory status improved.  Likely will take another 1  to 2 days.  Consultants:   None  Procedures:   None  Antimicrobials:  Ceftriaxone, azithromycin 12/17 >>    Subjective: Patient seen and evaluated today with ongoing severe cough and weakness.  She is coughing up yellowish sputum and does not feel well.  Objective: Vitals:   05/03/18 0537 05/03/18 0606 05/03/18 0906 05/03/18 1012  BP: 114/63  114/60 109/70  Pulse: 76  75 79  Resp: 16   16  Temp: 97.7 F (36.5 C)     TempSrc: Oral     SpO2: 99% 99%    Weight:  109.5 kg    Height:        Intake/Output Summary (Last 24 hours) at 05/03/2018 1024 Last data filed at 05/03/2018 1020 Gross per 24 hour  Intake 577 ml  Output -  Net 577 ml   Filed Weights   05/01/18 0529 05/02/18 0700 05/03/18 0606  Weight: 107 kg 106.8 kg 109.5 kg    Examination:  General exam: Appears calm and comfortable  Respiratory system: Clear to auscultation. Respiratory effort normal. Cardiovascular system: S1 & S2 heard, RRR. No JVD, murmurs, rubs, gallops or clicks. No pedal edema. Gastrointestinal system: Abdomen is nondistended, soft and nontender. No organomegaly or masses felt. Normal bowel sounds heard. Central nervous system: Alert and oriented. No focal neurological deficits. Extremities: Symmetric 5 x 5 power. Skin: No rashes, lesions or ulcers Psychiatry: Judgement and insight appear normal. Mood & affect appropriate.     Data Reviewed: I have personally reviewed following labs and imaging studies  CBC: Recent Labs  Lab 04/30/18 0350 05/01/18 0457 05/03/18 0428  WBC 11.6* 11.2* 11.1*  NEUTROABS 8.9*  --   --   HGB 9.1* 9.0* 10.5*  HCT 30.6* 30.4* 36.3  MCV 74.5* 74.3* 76.4*  PLT 240 283 001   Basic Metabolic Panel: Recent Labs  Lab 04/30/18 0350 05/01/18 0457 05/02/18 0417 05/03/18 0428  NA 134* 137 135 138  K 3.8 3.8 3.5 4.3  CL 98 99 99 99  CO2 27 29 29 31   GLUCOSE 163* 196* 170* 147*  BUN 22* 21* 24* 25*  CREATININE 1.15* 1.15* 1.23* 1.24*  CALCIUM 8.6* 8.3* 8.2* 8.9   GFR: Estimated Creatinine Clearance: 74.1 mL/min (A) (by C-G formula based on SCr of 1.24 mg/dL  (H)). Liver Function Tests: Recent Labs  Lab 04/30/18 0350  AST 26  ALT 20  ALKPHOS 68  BILITOT 0.5  PROT 7.3  ALBUMIN 3.3*   No results for input(s): LIPASE, AMYLASE in the last 168 hours. No results for input(s): AMMONIA in the last 168 hours. Coagulation Profile: No results for input(s): INR, PROTIME in the last 168 hours. Cardiac Enzymes: Recent Labs  Lab 04/30/18 0350  TROPONINI 0.04*   BNP (last 3 results) No results for input(s): PROBNP in the last 8760 hours. HbA1C: No results for input(s): HGBA1C in the last 72 hours. CBG: Recent Labs  Lab 05/02/18 0727 05/02/18 1141 05/02/18 1645 05/02/18 2112 05/03/18 0742  GLUCAP 171* 181* 150* 158* 166*   Lipid Profile: No results for input(s): CHOL, HDL, LDLCALC, TRIG, CHOLHDL, LDLDIRECT in the last 72 hours. Thyroid Function Tests: No results for input(s): TSH, T4TOTAL, FREET4, T3FREE, THYROIDAB in the last 72 hours. Anemia Panel: No results for input(s): VITAMINB12, FOLATE, FERRITIN, TIBC, IRON, RETICCTPCT in the last 72 hours. Sepsis Labs: Recent Labs  Lab 04/30/18 0401  LATICACIDVEN 1.38    Recent Results (from the past 240 hour(s))  Blood Culture (routine x 2)  Status: None (Preliminary result)   Collection Time: 04/30/18  3:40 AM  Result Value Ref Range Status   Specimen Description BLOOD RIGHT ARM  Final   Special Requests   Final    BOTTLES DRAWN AEROBIC AND ANAEROBIC Blood Culture adequate volume   Culture   Final    NO GROWTH 3 DAYS Performed at [facility], [address], [place], [place] [address]    Report Status PENDING  Incomplete  Blood Culture (routine x 2)     Status: None (Preliminary result)   Collection Time: 04/30/18  3:50 AM  Result Value Ref Range Status   Specimen Description BLOOD RIGHT ARM  Final   Special Requests   Final    BOTTLES DRAWN AEROBIC AND ANAEROBIC Blood Culture adequate volume   Culture   Final    NO GROWTH 3 DAYS Performed at [facility], [address], [place], [place] [address]    Report Status PENDING  Incomplete  Urine culture     Status: Abnormal   Collection Time: 04/30/18  5:22 AM  Result Value Ref Range Status   Specimen Description   Final    URINE, CLEAN CATCH Performed at [facility], [address] [place], [place] [address]    Special Requests   Final    NONE Performed at [facility], [address] [place], [place] [address]    Culture (A)  Final    10,000 COLONIES/mL METHICILLIN RESISTANT STAPHYLOCOCCUS AUREUS   Report Status 05/03/2018 FINAL  Final   Organism ID, Bacteria METHICILLIN RESISTANT STAPHYLOCOCCUS AUREUS (A)  Final      Susceptibility   Methicillin resistant staphylococcus aureus - MIC*    CIPROFLOXACIN <=0.5 SENSITIVE Sensitive     GENTAMICIN <=0.5 SENSITIVE Sensitive     NITROFURANTOIN <=16 SENSITIVE Sensitive     OXACILLIN >=4 RESISTANT Resistant     TETRACYCLINE <=1 SENSITIVE Sensitive     VANCOMYCIN 1 SENSITIVE Sensitive     TRIMETH/SULFA <=10 SENSITIVE Sensitive     CLINDAMYCIN <=0.25 SENSITIVE Sensitive     RIFAMPIN <=0.5 SENSITIVE Sensitive     Inducible Clindamycin NEGATIVE Sensitive     * 10,000 COLONIES/mL METHICILLIN RESISTANT STAPHYLOCOCCUS AUREUS  MRSA PCR Screening     Status: Abnormal   Collection Time: 05/02/18  8:29 AM  Result Value Ref Range Status   MRSA by PCR POSITIVE (A) NEGATIVE Final    Comment:        The GeneXpert MRSA Assay (FDA approved for NASAL specimens only), is one component of a comprehensive MRSA colonization surveillance program. It is not intended to diagnose MRSA infection nor to guide or monitor treatment for MRSA infections. RESULT CALLED TO, READ BACK BY AND VERIFIED WITH: VALDESE DILDY AT 1020 BY HFLYNT 05/02/18 Performed at [facility], [address] [place], [place] [address]   Expectorated sputum assessment w rflx to resp cult     Status: None   Collection Time: 05/02/18  2:00 PM  Result Value Ref Range Status   Specimen Description SPUTUM   Final   Special Requests NONE  Final   Sputum evaluation   Final    THIS SPECIMEN IS ACCEPTABLE FOR SPUTUM CULTURE Performed at [facility] Performed at [facility], [address] [place],  [address]    Report Status 05/02/2018 FINAL  Final  Culture, respiratory     Status: None (Preliminary result)   Collection Time: 05/02/18  2:00 PM  Result Value Ref Range Status   Specimen  Description   Final    EXPECTORATED SPUTUM Performed at [facility], [address] [place], [place] [address]    Special Requests   Final    NONE Reflexed from [id number] Performed at [facility], [address], [place], [place] [address]    Gram Stain   Final    ABUNDANT WBC PRESENT,BOTH PMN AND MONONUCLEAR FEW GRAM POSITIVE COCCI FEW GRAM VARIABLE ROD RARE YEAST    Culture   Final    CULTURE REINCUBATED FOR BETTER GROWTH Performed at [facility], [place] [address], [place], [place] [address]    Report Status PENDING  Incomplete         Radiology Studies: Dg Chest 2 View  Result Date: 05/03/2018 CLINICAL DATA:  Edema, productive cough EXAM: CHEST - 2 VIEW COMPARISON:  05/02/2018 FINDINGS: Cardiomegaly. No confluent airspace opacities, effusions or edema. No acute bony abnormality. IMPRESSION: Cardiomegaly.  No active disease. Electronically Signed   By: Rolm Baptise M.D.   On: 05/03/2018 09:44   Dg Chest 2 View  Result Date: 05/02/2018 CLINICAL DATA:  Cough and congestion. EXAM: CHEST - 2 VIEW COMPARISON:  04/30/2018. FINDINGS: Mediastinum and hilar structures normal. Stable cardiomegaly. Mild peribronchial cuffing. Mild right base subsegmental atelectasis. No pleural effusion or pneumothorax. Degenerative changes scoliosis thoracic spine. IMPRESSION: Mild peribronchial cuffing. Bronchitis could not be excluded. Mild right base subsegmental atelectasis. 2.  Stable cardiomegaly.  No pulmonary venous congestion. Electronically Signed   By: [place]   On: 05/02/2018 08:55         Scheduled Meds: . aspirin EC  81 mg Oral Daily  . benzonatate  200 mg Oral TID  . budesonide (PULMICORT) nebulizer solution  0.25 mg Nebulization BID  . carvedilol  50 mg Oral BID WC  . citalopram  20 mg Oral Daily  . enoxaparin (LOVENOX) injection  50 mg Subcutaneous Q24H  . furosemide  80 mg Intravenous BID  . gabapentin  600 mg Oral TID  . hydrALAZINE  100 mg Oral TID  . insulin aspart  0-15 Units Subcutaneous TID WC  . insulin aspart  0-5 Units Subcutaneous QHS  . insulin aspart  3 Units Subcutaneous TID WC  . insulin glargine  18 Units Subcutaneous QHS  . ipratropium-albuterol  3 mL Nebulization BID  . potassium chloride  40 mEq Oral Daily  . predniSONE  40 mg Oral BID WC  . pyridostigmine  60 mg Oral Q8H  . sacubitril-valsartan  1 tablet Oral BID  . sodium chloride flush  3 mL Intravenous Q12H  . spironolactone  50 mg Oral Daily  . traZODone  100 mg Oral QHS   Continuous Infusions: . azithromycin 500 mg (05/03/18 0920)  . cefTRIAXone (ROCEPHIN)  IV 1 g (05/02/18 1244)     LOS: 3 days    Time spent: 30 minutes    Braylyn Eye Darleen Crocker, DO [facility] Pager [phone]  If 7PM-7AM, please contact night-coverage [email or web address] Password [facility] 05/03/2018, 10:24 AM

## 2018-05-04 LAB — GLUCOSE, CAPILLARY
Glucose-Capillary: 210 mg/dL — ABNORMAL HIGH (ref 70–99)
Glucose-Capillary: 271 mg/dL — ABNORMAL HIGH (ref 70–99)

## 2018-05-04 LAB — CULTURE, RESPIRATORY W GRAM STAIN: Culture: NORMAL

## 2018-05-04 MED ORDER — IPRATROPIUM-ALBUTEROL 20-100 MCG/ACT IN AERS: 1.0000 | INHALATION_SPRAY | Freq: Four times a day (QID) | RESPIRATORY_TRACT | 0 refills | Status: DC | PRN

## 2018-05-04 MED ORDER — AZITHROMYCIN 500 MG PO TABS: 500.0000 mg | ORAL_TABLET | Freq: Every day | ORAL | 0 refills | Status: AC

## 2018-05-04 MED ORDER — BENZONATATE 200 MG PO CAPS: 200.0000 mg | ORAL_CAPSULE | Freq: Three times a day (TID) | ORAL | 0 refills | Status: DC

## 2018-05-04 MED ORDER — BUDESONIDE-FORMOTEROL FUMARATE 80-4.5 MCG/ACT IN AERO: 2.0000 | INHALATION_SPRAY | Freq: Two times a day (BID) | RESPIRATORY_TRACT | 12 refills | Status: DC

## 2018-05-04 MED ORDER — PREDNISONE 20 MG PO TABS: 40.0000 mg | ORAL_TABLET | Freq: Every day | ORAL | 0 refills | Status: AC

## 2018-05-04 MED ORDER — GUAIFENESIN-DM 100-10 MG/5ML PO SYRP: 10.0000 mL | ORAL_SOLUTION | ORAL | 0 refills | Status: DC | PRN

## 2018-05-04 NOTE — Discharge Summary (Signed)
Physician Discharge Summary  Chelsey Santiago:[id number] DOB: 07/04/1975 DOA: 04/30/2018  PCP: Rosita Fire, MD  Admit date: 04/30/2018  Discharge date: 05/04/2018  Admitted From:Home  Disposition:  Home  Recommendations for Outpatient Follow-up:  1. Follow up with PCP in 1-2 weeks 2. Continue on prednisone for 5 more days as well as azithromycin as ordered for 3 more days  Home Health: None  Equipment/Devices: None  Discharge Condition: Stable  CODE STATUS: Full  Diet recommendation: Heart Healthy/carb modified  Brief/Interim Summary: Per HPI: Chelsey Y Hairstonis a 42 y.o.femalewith a history ofCHF, HTN, obesity, IDDM who presented with a few days of cough and generally "feeling bad." Last week she noticed increased leg swelling and trouble breathing worse when she was laying flat or exerting herself so she took extra doses of torsemide. This helped the swelling somewhat, though the orthopnea and cough worsened starting 3 days prior to arrival despite taking home medications. She's had clear rhinorrhea and increasing cough. In the ED she was coughing, dyspneic, worse with walking to bathroom, severe range HTN improved with IV medication. Cr 1.15, BNP >1k, troponin 0.04, ECG without acute ischemic changes. CXR with cardiomegaly and vascular congestion consistent with CHF. WBC 11.6. She was admitted for acute CHF and symptoms consistent with viral URI.Fever curve has trended upward with onset of productive cough. CXR was performed showing atelectasis. Empiric antibiotics were ordered for acute bronchitis with no signs of pneumonia noted on chest x-ray.    She has done well over the course of the last several days on IV azithromycin and Rocephin, but continues to maintain a significant cough which is improving with the use of prednisone and Pulmicort.  She is currently stable for discharge and will continue 3 more days of oral azithromycin as well as 5 more days of oral  prednisone and will be given some Symbicort as well as Combivent to use at home.  She may resume her usual torsemide as well as other blood pressure agents at home as she is currently euvolemic.  Follow-up with primary care doctor in the next 1 week and will need follow-up to urology at that time to address any issues with stress incontinence.  Discharge Diagnoses:  Principal Problem:   Acute on chronic combined systolic and diastolic CHF, NYHA class 4 (HCC) Active Problems:   Morbid obesity (HCC)   OSA on CPAP   Microcytic anemia   Hypertensive urgency   Acute CHF ([place])   Essential hypertension   Cardiomyopathy due to hypertension (HCC)   Troponin level elevated   Depression   Diabetes mellitus with complication (HCC)   Leukocytosis   Iron deficiency anemia  Principal discharge diagnosis: Acute bronchitis along with acute on chronic combined systolic and diastolic CHF.  Discharge Instructions  Discharge Instructions    Diet - low sodium heart healthy   Complete by:  As directed    Diet - low sodium heart healthy   Complete by:  As directed    Increase activity slowly   Complete by:  As directed    Increase activity slowly   Complete by:  As directed      Allergies as of 05/04/2018      Reactions   Diclofenac Swelling   AND POSSIBLE SYNCOPE; tolerates ibuprofen per pt   Tramadol Nausea And Vomiting, Nausea Only   Itching (12/21); tolerates ibuprofen per pt   Vicodin [hydrocodone-acetaminophen] Itching, Nausea Only   Hydrocodone Bitartrate Itching      Medication List  TAKE these medications   albuterol 108 (90 Base) MCG/ACT inhaler Commonly known as:  PROVENTIL HFA;VENTOLIN HFA Inhale 1 puff into the lungs every 6 (six) hours as needed for wheezing or shortness of breath.   aspirin EC 81 MG tablet Take 81 mg by mouth daily.   azithromycin 500 MG tablet Commonly known as:  ZITHROMAX Take 1 tablet (500 mg total) by mouth daily for 3 days. Take 1 tablet daily  for 3 days.   benzonatate 200 MG capsule Commonly known as:  TESSALON Take 1 capsule (200 mg total) by mouth 3 (three) times daily.   budesonide-formoterol 80-4.5 MCG/ACT inhaler Commonly known as:  SYMBICORT Inhale 2 puffs into the lungs 2 (two) times daily.   carvedilol 25 MG tablet Commonly known as:  COREG Take 2 tablets (50 mg total) by mouth 2 (two) times daily with a meal.   CELEXA 20 MG tablet Generic drug:  citalopram Take 20 mg by mouth daily.   freestyle lancets Use as instructed   gabapentin 600 MG tablet Commonly known as:  NEURONTIN Take 600 mg by mouth 3 (three) times daily.   guaiFENesin-dextromethorphan 100-10 MG/5ML syrup Commonly known as:  ROBITUSSIN DM Take 10 mLs by mouth every 4 (four) hours as needed for cough.   hydrALAZINE 100 MG tablet Commonly known as:  APRESOLINE Take 1 tablet (100 mg total) by mouth 3 (three) times daily.   insulin glargine 100 UNIT/ML injection Commonly known as:  LANTUS Inject 30 Units at bedtime into the skin.   insulin lispro 100 UNIT/ML injection Commonly known as:  HUMALOG Inject into the skin 3 (three) times daily before meals.   Ipratropium-Albuterol 20-100 MCG/ACT Aers respimat Commonly known as:  COMBIVENT RESPIMAT Inhale 1 puff into the lungs every 6 (six) hours as needed for wheezing or shortness of breath.   metFORMIN 500 MG tablet Commonly known as:  GLUCOPHAGE Take 500 mg 2 (two) times daily with a meal by mouth.   naproxen 500 MG tablet Commonly known as:  NAPROSYN Take 500 mg by mouth 2 (two) times daily with a meal.   nitroGLYCERIN 0.4 MG SL tablet Commonly known as:  NITROSTAT Place 1 tablet (0.4 mg total) under the tongue every 5 (five) minutes as needed for chest pain.   omeprazole 40 MG capsule Commonly known as:  PRILOSEC Take 40 mg by mouth daily.   ondansetron 4 MG tablet Commonly known as:  ZOFRAN Take 4 mg by mouth daily as needed for nausea or vomiting.    oxyCODONE-acetaminophen 5-325 MG tablet Commonly known as:  PERCOCET/ROXICET Take 1 tablet by mouth every 4 (four) hours as needed for severe pain.   potassium chloride SA 20 MEQ tablet Commonly known as:  K-DUR,KLOR-CON Take 1 tablet (20 mEq total) by mouth daily.   predniSONE 20 MG tablet Commonly known as:  DELTASONE Take 2 tablets (40 mg total) by mouth daily with breakfast for 5 days.   pyridostigmine 60 MG tablet Commonly known as:  MESTINON Take 60 mg by mouth every 8 (eight) hours.   sacubitril-valsartan 97-103 MG Commonly known as:  ENTRESTO Take 1 tablet by mouth 2 (two) times daily.   spironolactone 50 MG tablet Commonly known as:  ALDACTONE Take 1 tablet (50 mg total) by mouth daily.   torsemide 20 MG tablet Commonly known as:  DEMADEX Please take 40 mg oral every morning and 20 mg every evening. What changed:    how much to take  how to take this  when to take this   traZODone 100 MG tablet Commonly known as:  DESYREL Take 100 mg by mouth 3 (three) times daily after meals.      Follow-up Information    Rosita Fire, MD Follow up in 1 week(s).   Specialty:  Internal Medicine Contact information: [place] [address] [phone]        Arnoldo Lenis, MD .   Specialty:  Cardiology Contact information: [place] [address] [phone]          Allergies  Allergen Reactions  . Diclofenac Swelling    AND POSSIBLE SYNCOPE; tolerates ibuprofen per pt  . Tramadol Nausea And Vomiting and Nausea Only    Itching (12/21); tolerates ibuprofen per pt  . Vicodin [Hydrocodone-Acetaminophen] Itching and Nausea Only  . Hydrocodone Bitartrate Itching    Consultations:  None   Procedures/Studies: Dg Chest 2 View  Result Date: 05/03/2018 CLINICAL DATA:  Edema, productive cough EXAM: CHEST - 2 VIEW COMPARISON:  05/02/2018 FINDINGS: Cardiomegaly. No confluent airspace opacities, effusions or  edema. No acute bony abnormality. IMPRESSION: Cardiomegaly.  No active disease. Electronically Signed   By: Rolm Baptise M.D.   On: 05/03/2018 09:44   Dg Chest 2 View  Result Date: 05/02/2018 CLINICAL DATA:  Cough and congestion. EXAM: CHEST - 2 VIEW COMPARISON:  04/30/2018. FINDINGS: Mediastinum and hilar structures normal. Stable cardiomegaly. Mild peribronchial cuffing. Mild right base subsegmental atelectasis. No pleural effusion or pneumothorax. Degenerative changes scoliosis thoracic spine. IMPRESSION: Mild peribronchial cuffing. Bronchitis could not be excluded. Mild right base subsegmental atelectasis. 2.  Stable cardiomegaly.  No pulmonary venous congestion. Electronically Signed   By: Marcello Moores  Register   On: 05/02/2018 08:55   Dg Chest 2 View  Result Date: 04/30/2018 CLINICAL DATA:  Weakness, cough.  History of CHF. EXAM: CHEST - 2 VIEW COMPARISON:  Chest radiograph Sep 29, 2017 FINDINGS: Cardiac silhouette is moderately enlarged and unchanged. Mediastinal silhouette is unremarkable. Mild pulmonary vascular congestion without pleural effusion or focal consolidation. No pneumothorax. Large body habitus. Surgical clips in the included right abdomen compatible with cholecystectomy. Mild degenerative changes thoracic spine. IMPRESSION: 1. Stable cardiomegaly and mild pulmonary vascular congestion. Electronically Signed   By: Elon Alas M.D.   On: 04/30/2018 04:46     Discharge Exam: Vitals:   05/04/18 0636 05/04/18 0801  BP: (!) 152/91   Pulse: 72   Resp: 18   Temp: 97.6 F (36.4 C)   SpO2: 95% 96%   Vitals:   05/03/18 2151 05/04/18 0500 05/04/18 0636 05/04/18 0801  BP:   (!) 152/91   Pulse:   72   Resp:   18   Temp:   97.6 F (36.4 C)   TempSrc:   Oral   SpO2: 95%  95% 96%  Weight:  105.5 kg    Height:        General: Pt is alert, awake, not in acute distress Cardiovascular: RRR, S1/S2 +, no rubs, no gallops Respiratory: CTA bilaterally, no wheezing, no  rhonchi Abdominal: Soft, NT, ND, bowel sounds + Extremities: no edema, no cyanosis    The results of significant diagnostics from this hospitalization (including imaging, microbiology, ancillary and laboratory) are listed below for reference.     Microbiology: Recent Results (from the past 240 hour(s))  Blood Culture (routine x 2)     Status: None (Preliminary result)   Collection Time: 04/30/18  3:40 AM  Result Value Ref Range Status   Specimen  Description BLOOD RIGHT ARM  Final   Special Requests   Final    BOTTLES DRAWN AEROBIC AND ANAEROBIC Blood Culture adequate volume   Culture   Final    NO GROWTH 4 DAYS Performed at [facility], [address], [place], [place] [address]    Report Status PENDING  Incomplete  Blood Culture (routine x 2)     Status: None (Preliminary result)   Collection Time: 04/30/18  3:50 AM  Result Value Ref Range Status   Specimen Description BLOOD RIGHT ARM  Final   Special Requests   Final    BOTTLES DRAWN AEROBIC AND ANAEROBIC Blood Culture adequate volume   Culture   Final    NO GROWTH 4 DAYS Performed at [facility], [address] [place], [place] [address]    Report Status PENDING  Incomplete  Urine culture     Status: Abnormal   Collection Time: 04/30/18  5:22 AM  Result Value Ref Range Status   Specimen Description   Final    URINE, CLEAN CATCH Performed at [facility], [address], [place], [place] [address]    Special Requests   Final    NONE Performed at [facility], [address], [place], [place] [address]    Culture (A)  Final    10,000 COLONIES/mL METHICILLIN RESISTANT STAPHYLOCOCCUS AUREUS   Report Status 05/03/2018 FINAL  Final   Organism ID, Bacteria METHICILLIN RESISTANT STAPHYLOCOCCUS AUREUS (A)  Final      Susceptibility   Methicillin resistant staphylococcus aureus - MIC*    CIPROFLOXACIN <=0.5 SENSITIVE Sensitive     GENTAMICIN <=0.5 SENSITIVE Sensitive     NITROFURANTOIN <=16 SENSITIVE Sensitive      OXACILLIN >=4 RESISTANT Resistant     TETRACYCLINE <=1 SENSITIVE Sensitive     VANCOMYCIN 1 SENSITIVE Sensitive     TRIMETH/SULFA <=10 SENSITIVE Sensitive     CLINDAMYCIN <=0.25 SENSITIVE Sensitive     RIFAMPIN <=0.5 SENSITIVE Sensitive     Inducible Clindamycin NEGATIVE Sensitive     * 10,000 COLONIES/mL METHICILLIN RESISTANT STAPHYLOCOCCUS AUREUS  MRSA PCR Screening     Status: Abnormal   Collection Time: 05/02/18  8:29 AM  Result Value Ref Range Status   MRSA by PCR POSITIVE (A) NEGATIVE Final    Comment:        The GeneXpert MRSA Assay (FDA approved for NASAL specimens only), is one component of a comprehensive MRSA colonization surveillance program. It is not intended to diagnose MRSA infection nor to guide or monitor treatment for MRSA infections. RESULT CALLED TO, READ BACK BY AND VERIFIED WITH: VALDESE DILDY AT 1020 BY HFLYNT 05/02/18 Performed at [facility], [address] [place], [place] [address]   Expectorated sputum assessment w rflx to resp cult     Status: None   Collection Time: 05/02/18  2:00 PM  Result Value Ref Range Status   Specimen Description SPUTUM  Final   Special Requests NONE  Final   Sputum evaluation   Final    THIS SPECIMEN IS ACCEPTABLE FOR SPUTUM CULTURE Performed at [facility] Performed at [facility], [address] [place], [place] [address]    Report Status 05/02/2018 FINAL  Final  Culture, respiratory     Status: None   Collection Time: 05/02/18  2:00 PM  Result Value Ref Range Status   Specimen Description   Final    EXPECTORATED SPUTUM Performed at [facility], [address] [place], [place] [address]    Special  Requests   Final    NONE Reflexed from [id number] Performed at [facility], [address] [place], [place] [address]    Gram Stain   Final    ABUNDANT WBC PRESENT,BOTH PMN AND MONONUCLEAR FEW GRAM POSITIVE COCCI FEW GRAM VARIABLE ROD RARE YEAST    Culture   Final    Consistent with normal respiratory  flora. Performed at [facility], [place] [address] [place], [place] [address]    Report Status 05/04/2018 FINAL  Final     Labs: BNP (last 3 results) Recent Labs    09/29/17 0016 04/30/18 0350  BNP 683.5* 6,659.9*   Basic Metabolic Panel: Recent Labs  Lab 04/30/18 0350 05/01/18 0457 05/02/18 0417 05/03/18 0428  NA 134* 137 135 138  K 3.8 3.8 3.5 4.3  CL 98 99 99 99  CO2 27 29 29 31   GLUCOSE 163* 196* 170* 147*  BUN 22* 21* 24* 25*  CREATININE 1.15* 1.15* 1.23* 1.24*  CALCIUM 8.6* 8.3* 8.2* 8.9   Liver Function Tests: Recent Labs  Lab 04/30/18 0350  AST 26  ALT 20  ALKPHOS 68  BILITOT 0.5  PROT 7.3  ALBUMIN 3.3*   No results for input(s): LIPASE, AMYLASE in the last 168 hours. No results for input(s): AMMONIA in the last 168 hours. CBC: Recent Labs  Lab 04/30/18 0350 05/01/18 0457 05/03/18 0428  WBC 11.6* 11.2* 11.1*  NEUTROABS 8.9*  --   --   HGB 9.1* 9.0* 10.5*  HCT 30.6* 30.4* 36.3  MCV 74.5* 74.3* 76.4*  PLT 240 283 329   Cardiac Enzymes: Recent Labs  Lab 04/30/18 0350  TROPONINI 0.04*   BNP: Invalid input(s): POCBNP CBG: Recent Labs  Lab 05/03/18 0742 05/03/18 1616 05/03/18 2142 05/03/18 2349 05/04/18 0730  GLUCAP 166* 285* 349* 366* 210*   D-Dimer No results for input(s): DDIMER in the last 72 hours. Hgb A1c No results for input(s): HGBA1C in the last 72 hours. Lipid Profile No results for input(s): CHOL, HDL, LDLCALC, TRIG, CHOLHDL, LDLDIRECT in the last 72 hours. Thyroid function studies No results for input(s): TSH, T4TOTAL, T3FREE, THYROIDAB in the last 72 hours.  Invalid input(s): FREET3 Anemia work up No results for input(s): VITAMINB12, FOLATE, FERRITIN, TIBC, IRON, RETICCTPCT in the last 72 hours. Urinalysis    Component Value Date/Time   COLORURINE YELLOW 04/30/2018 0450   APPEARANCEUR HAZY (A) 04/30/2018 0450   LABSPEC 1.028 04/30/2018 0450   PHURINE 5.0 04/30/2018 0450   GLUCOSEU NEGATIVE 04/30/2018 0450    HGBUR MODERATE (A) 04/30/2018 0450   BILIRUBINUR NEGATIVE 04/30/2018 0450   KETONESUR NEGATIVE 04/30/2018 0450   PROTEINUR 100 (A) 04/30/2018 0450   UROBILINOGEN 0.2 04/29/2014 1115   NITRITE NEGATIVE 04/30/2018 0450   LEUKOCYTESUR SMALL (A) 04/30/2018 0450   Sepsis Labs Invalid input(s): PROCALCITONIN,  WBC,  LACTICIDVEN Microbiology Recent Results (from the past 240 hour(s))  Blood Culture (routine x 2)     Status: None (Preliminary result)   Collection Time: 04/30/18  3:40 AM  Result Value Ref Range Status   Specimen Description BLOOD RIGHT ARM  Final   Special Requests   Final    BOTTLES DRAWN AEROBIC AND ANAEROBIC Blood Culture adequate volume   Culture   Final    NO GROWTH 4 DAYS Performed at [facility], [address] [place], [place] [address]    Report Status PENDING  Incomplete  Blood Culture (routine x 2)     Status: None (Preliminary result)   Collection  Time: 04/30/18  3:50 AM  Result Value Ref Range Status   Specimen Description BLOOD RIGHT ARM  Final   Special Requests   Final    BOTTLES DRAWN AEROBIC AND ANAEROBIC Blood Culture adequate volume   Culture   Final    NO GROWTH 4 DAYS Performed at [facility], [address], [place], [place] [address]    Report Status PENDING  Incomplete  Urine culture     Status: Abnormal   Collection Time: 04/30/18  5:22 AM  Result Value Ref Range Status   Specimen Description   Final    URINE, CLEAN CATCH Performed at [facility], [address] [place], [place] [address]    Special Requests   Final    NONE Performed at [facility], [address], [place], [place] [address]    Culture (A)  Final    10,000 COLONIES/mL METHICILLIN RESISTANT STAPHYLOCOCCUS AUREUS   Report Status 05/03/2018 FINAL  Final   Organism ID, Bacteria METHICILLIN RESISTANT STAPHYLOCOCCUS AUREUS (A)  Final      Susceptibility   Methicillin resistant staphylococcus aureus - MIC*    CIPROFLOXACIN <=0.5 SENSITIVE Sensitive     GENTAMICIN  <=0.5 SENSITIVE Sensitive     NITROFURANTOIN <=16 SENSITIVE Sensitive     OXACILLIN >=4 RESISTANT Resistant     TETRACYCLINE <=1 SENSITIVE Sensitive     VANCOMYCIN 1 SENSITIVE Sensitive     TRIMETH/SULFA <=10 SENSITIVE Sensitive     CLINDAMYCIN <=0.25 SENSITIVE Sensitive     RIFAMPIN <=0.5 SENSITIVE Sensitive     Inducible Clindamycin NEGATIVE Sensitive     * 10,000 COLONIES/mL METHICILLIN RESISTANT STAPHYLOCOCCUS AUREUS  MRSA PCR Screening     Status: Abnormal   Collection Time: 05/02/18  8:29 AM  Result Value Ref Range Status   MRSA by PCR POSITIVE (A) NEGATIVE Final    Comment:        The GeneXpert MRSA Assay (FDA approved for NASAL specimens only), is one component of a comprehensive MRSA colonization surveillance program. It is not intended to diagnose MRSA infection nor to guide or monitor treatment for MRSA infections. RESULT CALLED TO, READ BACK BY AND VERIFIED WITH: VALDESE DILDY AT 1020 BY HFLYNT 05/02/18 Performed at [facility], [address] [place], [place] [address]   Expectorated sputum assessment w rflx to resp cult     Status: None   Collection Time: 05/02/18  2:00 PM  Result Value Ref Range Status   Specimen Description SPUTUM  Final   Special Requests NONE  Final   Sputum evaluation   Final    THIS SPECIMEN IS ACCEPTABLE FOR SPUTUM CULTURE Performed at [facility] Performed at [facility], [address], [place], [place] [address]    Report Status 05/02/2018 FINAL  Final  Culture, respiratory     Status: None   Collection Time: 05/02/18  2:00 PM  Result Value Ref Range Status   Specimen Description   Final    EXPECTORATED SPUTUM Performed at [facility], [address] [place], [place] [address]    Special Requests   Final    NONE Reflexed from [id number] Performed at [facility], [address] [place], [place] [address]    Gram Stain   Final    ABUNDANT WBC PRESENT,BOTH PMN AND MONONUCLEAR FEW GRAM POSITIVE COCCI FEW GRAM VARIABLE  ROD RARE YEAST    Culture   Final    Consistent with normal respiratory flora. Performed at [facility], [place]  [address], [place], [place] [address]    Report Status 05/04/2018 FINAL  Final     Time coordinating discharge: 35 minutes  SIGNED:   Rodena Goldmann, DO [facility] 05/04/2018, 10:09 AM Pager [phone]  If 7PM-7AM, please contact night-coverage [email or web address] Password [email or web address]

## 2018-05-04 NOTE — Progress Notes (Signed)
Discharge instructions reviewed with patient. IV removed and telemetry leads removed. Patient escorted downstairs in wheelchair.

## 2018-05-04 NOTE — Progress Notes (Addendum)
Inpatient Diabetes Program Recommendations  AACE/ADA: New Consensus Statement on Inpatient Glycemic Control (2015)  Target Ranges:  Prepandial:   less than 140 mg/dL      Peak postprandial:   less than 180 mg/dL (1-2 hours)      Critically ill patients:  140 - 180 mg/dL   Results for Chelsey Santiago, Chelsey Santiago (MRN [id number]) as of 05/04/2018 08:25  Ref. Range 05/03/2018 07:42 05/03/2018 16:16 05/03/2018 21:42 05/03/2018 23:49 05/04/2018 07:30  Glucose-Capillary Latest Ref Range: 70 - 99 mg/dL 166 (H) 285 (H) 349 (H) 366 (H) 210 (H)   Review of Glycemic Control  Diabetes history: DM2 Outpatient Diabetes medications: Lantus 30 units QHS, Humalog TID with meals, Metformin 500 mg BID Current orders for Inpatient glycemic control: Lantus 18 units QHS, Novolog 0-15 units TID with meals, Novolog 0-5 units QHS, Novolog 3 units TID with meals; Prednisone 40 mg BID  Inpatient Diabetes Program Recommendations:   Insulin - Basal: Please consider increasing Lantus to 20 units QHS.  Insulin - Meal Coverage: Please consider increasing meal coverage to Novolog 6 units TID with meals if patient eats at least 50% of meals.  Thanks, Barnie Alderman, RN, MSN, CDE Diabetes Coordinator Inpatient Diabetes Program [phone] (Team Pager from 8am to 5pm)

## 2018-05-05 LAB — CULTURE, BLOOD (ROUTINE X 2)
CULTURE: NO GROWTH
Culture: NO GROWTH
Special Requests: ADEQUATE
Special Requests: ADEQUATE

## 2018-05-08 LAB — GLUCOSE, CAPILLARY
Glucose-Capillary: 167 mg/dL — ABNORMAL HIGH (ref 70–99)
Glucose-Capillary: 194 mg/dL — ABNORMAL HIGH (ref 70–99)
Glucose-Capillary: 192 mg/dL — ABNORMAL HIGH (ref 70–99)
Glucose-Capillary: 147 mg/dL — ABNORMAL HIGH (ref 70–99)

## 2018-05-12 ENCOUNTER — Emergency Department (HOSPITAL_COMMUNITY): Payer: Medicaid Other

## 2018-05-12 ENCOUNTER — Other Ambulatory Visit: Payer: Self-pay

## 2018-05-12 ENCOUNTER — Encounter (HOSPITAL_COMMUNITY): Payer: Self-pay

## 2018-05-12 ENCOUNTER — Inpatient Hospital Stay (HOSPITAL_COMMUNITY)
Admission: EM | Admit: 2018-05-12 | Discharge: 2018-05-16 | DRG: 291 | Disposition: A | Discharge status: Home / Self Care | Payer: Medicaid Other | Attending: Internal Medicine | Admitting: Internal Medicine

## 2018-05-12 DIAGNOSIS — Z7982 Long term (current) use of aspirin: Secondary | ICD-10-CM

## 2018-05-12 DIAGNOSIS — J1000 Influenza due to other identified influenza virus with unspecified type of pneumonia: Secondary | ICD-10-CM | POA: Diagnosis present

## 2018-05-12 DIAGNOSIS — I428 Other cardiomyopathies: Secondary | ICD-10-CM | POA: Diagnosis not present

## 2018-05-12 DIAGNOSIS — I110 Hypertensive heart disease with heart failure: Principal | ICD-10-CM | POA: Diagnosis present

## 2018-05-12 DIAGNOSIS — I509 Heart failure, unspecified: Secondary | ICD-10-CM

## 2018-05-12 DIAGNOSIS — D509 Iron deficiency anemia, unspecified: Secondary | ICD-10-CM | POA: Diagnosis present

## 2018-05-12 DIAGNOSIS — Z9989 Dependence on other enabling machines and devices: Secondary | ICD-10-CM

## 2018-05-12 DIAGNOSIS — E66812 Obesity, class 2: Secondary | ICD-10-CM | POA: Diagnosis present

## 2018-05-12 DIAGNOSIS — I160 Hypertensive urgency: Secondary | ICD-10-CM | POA: Diagnosis present

## 2018-05-12 DIAGNOSIS — J101 Influenza due to other identified influenza virus with other respiratory manifestations: Secondary | ICD-10-CM | POA: Diagnosis present

## 2018-05-12 DIAGNOSIS — I119 Hypertensive heart disease without heart failure: Secondary | ICD-10-CM | POA: Diagnosis present

## 2018-05-12 DIAGNOSIS — E119 Type 2 diabetes mellitus without complications: Secondary | ICD-10-CM | POA: Diagnosis not present

## 2018-05-12 DIAGNOSIS — R51 Headache: Secondary | ICD-10-CM

## 2018-05-12 DIAGNOSIS — I5043 Acute on chronic combined systolic (congestive) and diastolic (congestive) heart failure: Secondary | ICD-10-CM | POA: Diagnosis present

## 2018-05-12 DIAGNOSIS — F419 Anxiety disorder, unspecified: Secondary | ICD-10-CM | POA: Diagnosis present

## 2018-05-12 DIAGNOSIS — J069 Acute upper respiratory infection, unspecified: Secondary | ICD-10-CM

## 2018-05-12 DIAGNOSIS — Z794 Long term (current) use of insulin: Secondary | ICD-10-CM | POA: Diagnosis not present

## 2018-05-12 DIAGNOSIS — Z6837 Body mass index (BMI) 37.0-37.9, adult: Secondary | ICD-10-CM

## 2018-05-12 DIAGNOSIS — G4733 Obstructive sleep apnea (adult) (pediatric): Secondary | ICD-10-CM

## 2018-05-12 DIAGNOSIS — J440 Chronic obstructive pulmonary disease with acute lower respiratory infection: Secondary | ICD-10-CM | POA: Diagnosis present

## 2018-05-12 DIAGNOSIS — F329 Major depressive disorder, single episode, unspecified: Secondary | ICD-10-CM | POA: Diagnosis not present

## 2018-05-12 DIAGNOSIS — Z79899 Other long term (current) drug therapy: Secondary | ICD-10-CM

## 2018-05-12 DIAGNOSIS — I43 Cardiomyopathy in diseases classified elsewhere: Secondary | ICD-10-CM | POA: Diagnosis not present

## 2018-05-12 DIAGNOSIS — R519 Headache, unspecified: Secondary | ICD-10-CM | POA: Diagnosis present

## 2018-05-12 DIAGNOSIS — Z833 Family history of diabetes mellitus: Secondary | ICD-10-CM

## 2018-05-12 DIAGNOSIS — R0600 Dyspnea, unspecified: Secondary | ICD-10-CM | POA: Diagnosis not present

## 2018-05-12 DIAGNOSIS — E876 Hypokalemia: Secondary | ICD-10-CM

## 2018-05-12 DIAGNOSIS — Z7951 Long term (current) use of inhaled steroids: Secondary | ICD-10-CM | POA: Diagnosis not present

## 2018-05-12 DIAGNOSIS — R0602 Shortness of breath: Secondary | ICD-10-CM | POA: Diagnosis not present

## 2018-05-12 DIAGNOSIS — Z8249 Family history of ischemic heart disease and other diseases of the circulatory system: Secondary | ICD-10-CM

## 2018-05-12 DIAGNOSIS — E1165 Type 2 diabetes mellitus with hyperglycemia: Secondary | ICD-10-CM | POA: Diagnosis present

## 2018-05-12 DIAGNOSIS — I10 Essential (primary) hypertension: Secondary | ICD-10-CM

## 2018-05-12 DIAGNOSIS — J189 Pneumonia, unspecified organism: Secondary | ICD-10-CM

## 2018-05-12 DIAGNOSIS — E118 Type 2 diabetes mellitus with unspecified complications: Secondary | ICD-10-CM | POA: Diagnosis present

## 2018-05-12 LAB — INFLUENZA PANEL BY PCR (TYPE A & B)
Influenza A By PCR: NEGATIVE
Influenza B By PCR: POSITIVE — AB

## 2018-05-12 LAB — BASIC METABOLIC PANEL
ANION GAP: 11 (ref 5–15)
BUN: 23 mg/dL — ABNORMAL HIGH (ref 6–20)
CO2: 25 mmol/L (ref 22–32)
Calcium: 8.6 mg/dL — ABNORMAL LOW (ref 8.9–10.3)
Chloride: 99 mmol/L (ref 98–111)
Creatinine, Ser: 1.05 mg/dL — ABNORMAL HIGH (ref 0.44–1.00)
GFR calc Af Amer: >60 mL/min (ref >60)
GFR calc non Af Amer: >60 mL/min (ref >60)
Glucose, Bld: 150 mg/dL — ABNORMAL HIGH (ref 70–99)
Potassium: 3 mmol/L — ABNORMAL LOW (ref 3.5–5.1)
Sodium: 135 mmol/L (ref 135–145)

## 2018-05-12 LAB — CBC WITH DIFFERENTIAL/PLATELET
Abs Immature Granulocytes: 0.07 10*3/uL (ref 0.00–0.07)
Basophils Absolute: 0 10*3/uL (ref 0.0–0.1)
Basophils Relative: 0 %
Eosinophils Absolute: 0 10*3/uL (ref 0.0–0.5)
Eosinophils Relative: 0 %
HCT: 28.9 % — ABNORMAL LOW (ref 36.0–46.0)
Hemoglobin: 8.8 g/dL — ABNORMAL LOW (ref 12.0–15.0)
Immature Granulocytes: 1 %
Lymphocytes Relative: 13 %
Lymphs Abs: 1.2 10*3/uL (ref 0.7–4.0)
MCH: 21.9 pg — ABNORMAL LOW (ref 26.0–34.0)
MCHC: 30.4 g/dL (ref 30.0–36.0)
MCV: 71.9 fL — ABNORMAL LOW (ref 80.0–100.0)
Monocytes Absolute: 0.7 10*3/uL (ref 0.1–1.0)
Monocytes Relative: 7 %
Neutro Abs: 7 10*3/uL (ref 1.7–7.7)
Neutrophils Relative %: 79 %
Platelets: 247 10*3/uL (ref 150–400)
RBC: 4.02 MIL/uL (ref 3.87–5.11)
RDW: 14.7 % (ref 11.5–15.5)
WBC: 9 10*3/uL (ref 4.0–10.5)
nRBC: 0 % (ref 0.0–0.2)

## 2018-05-12 LAB — URINALYSIS, ROUTINE W REFLEX MICROSCOPIC
Bilirubin Urine: NEGATIVE
Glucose, UA: NEGATIVE mg/dL
HGB URINE DIPSTICK: NEGATIVE
Ketones, ur: NEGATIVE mg/dL
Leukocytes, UA: NEGATIVE
Nitrite: NEGATIVE
Protein, ur: NEGATIVE mg/dL
Specific Gravity, Urine: 1.004 — ABNORMAL LOW (ref 1.005–1.030)
pH: 6 (ref 5.0–8.0)

## 2018-05-12 LAB — GLUCOSE, CAPILLARY
Glucose-Capillary: 152 mg/dL — ABNORMAL HIGH (ref 70–99)
Glucose-Capillary: 177 mg/dL — ABNORMAL HIGH (ref 70–99)
Glucose-Capillary: 251 mg/dL — ABNORMAL HIGH (ref 70–99)

## 2018-05-12 LAB — TROPONIN I
Troponin I: <0.03 ng/mL (ref <0.03)
Troponin I: <0.03 ng/mL (ref <0.03)
Troponin I: <0.03 ng/mL (ref <0.03)

## 2018-05-12 LAB — HEMOGLOBIN A1C
HEMOGLOBIN A1C: 8.5 % — AB (ref 4.8–5.6)
Mean Plasma Glucose: 197.25 mg/dL

## 2018-05-12 LAB — MRSA PCR SCREENING: MRSA BY PCR: NEGATIVE

## 2018-05-12 LAB — BRAIN NATRIURETIC PEPTIDE: B Natriuretic Peptide: 1812 pg/mL — ABNORMAL HIGH (ref 0.0–100.0)

## 2018-05-12 LAB — MAGNESIUM: Magnesium: 1.4 mg/dL — ABNORMAL LOW (ref 1.7–2.4)

## 2018-05-12 MED ORDER — ACETAMINOPHEN 650 MG RE SUPP: 650.0000 mg | Freq: Four times a day (QID) | RECTAL | Status: DC | PRN

## 2018-05-12 MED ORDER — PANTOPRAZOLE SODIUM 40 MG PO TBEC
40.0000 mg | DELAYED_RELEASE_TABLET | Freq: Every day | ORAL | Status: DC
  Administered 2018-05-12 – 2018-05-16 (×5): 40 mg via ORAL
  Filled 2018-05-12 (×5): qty 1

## 2018-05-12 MED ORDER — ACETAMINOPHEN 325 MG PO TABS
ORAL_TABLET | ORAL | Status: AC
  Filled 2018-05-12: qty 2

## 2018-05-12 MED ORDER — ONDANSETRON HCL 4 MG PO TABS: 4.0000 mg | ORAL_TABLET | Freq: Four times a day (QID) | ORAL | Status: DC | PRN

## 2018-05-12 MED ORDER — FLEET ENEMA 7-19 GM/118ML RE ENEM: 1.0000 | ENEMA | Freq: Once | RECTAL | Status: DC | PRN

## 2018-05-12 MED ORDER — SODIUM CHLORIDE 0.9 % IV SOLN
1.0000 g | Freq: Three times a day (TID) | INTRAVENOUS | Status: DC
  Administered 2018-05-12 – 2018-05-14 (×6): 1 g via INTRAVENOUS
  Filled 2018-05-12 (×11): qty 1

## 2018-05-12 MED ORDER — ASPIRIN EC 81 MG PO TBEC
81.0000 mg | DELAYED_RELEASE_TABLET | Freq: Every day | ORAL | Status: DC
  Administered 2018-05-12 – 2018-05-16 (×5): 81 mg via ORAL
  Filled 2018-05-12 (×5): qty 1

## 2018-05-12 MED ORDER — ACETAMINOPHEN 325 MG PO TABS
650.0000 mg | ORAL_TABLET | Freq: Once | ORAL | Status: AC
  Administered 2018-05-12: 650 mg via ORAL

## 2018-05-12 MED ORDER — SODIUM CHLORIDE 0.9% FLUSH
3.0000 mL | Freq: Two times a day (BID) | INTRAVENOUS | Status: DC
  Administered 2018-05-12: 3 mL via INTRAVENOUS

## 2018-05-12 MED ORDER — OXYCODONE-ACETAMINOPHEN 5-325 MG PO TABS
1.0000 | ORAL_TABLET | ORAL | Status: DC | PRN
  Filled 2018-05-12: qty 1

## 2018-05-12 MED ORDER — LIVING BETTER WITH HEART FAILURE BOOK: Freq: Once | Status: DC

## 2018-05-12 MED ORDER — NITROGLYCERIN 0.4 MG SL SUBL: 0.4000 mg | SUBLINGUAL_TABLET | SUBLINGUAL | Status: DC | PRN

## 2018-05-12 MED ORDER — SPIRONOLACTONE 25 MG PO TABS
50.0000 mg | ORAL_TABLET | Freq: Every day | ORAL | Status: DC
  Administered 2018-05-12 – 2018-05-16 (×5): 50 mg via ORAL
  Filled 2018-05-12 (×2): qty 1
  Filled 2018-05-12: qty 2
  Filled 2018-05-12 (×4): qty 1

## 2018-05-12 MED ORDER — IPRATROPIUM-ALBUTEROL 0.5-2.5 (3) MG/3ML IN SOLN
3.0000 mL | Freq: Four times a day (QID) | RESPIRATORY_TRACT | Status: DC
  Administered 2018-05-12 – 2018-05-16 (×16): 3 mL via RESPIRATORY_TRACT
  Filled 2018-05-12 (×15): qty 3

## 2018-05-12 MED ORDER — SORBITOL 70 % SOLN
30.0000 mL | Freq: Every day | Status: DC | PRN
  Filled 2018-05-12: qty 30

## 2018-05-12 MED ORDER — HYDRALAZINE HCL 25 MG PO TABS
100.0000 mg | ORAL_TABLET | Freq: Three times a day (TID) | ORAL | Status: DC
  Administered 2018-05-12 – 2018-05-16 (×12): 100 mg via ORAL
  Filled 2018-05-12 (×12): qty 4

## 2018-05-12 MED ORDER — SODIUM CHLORIDE 0.9% FLUSH: 3.0000 mL | INTRAVENOUS | Status: DC | PRN

## 2018-05-12 MED ORDER — NAPROXEN 250 MG PO TABS
500.0000 mg | ORAL_TABLET | Freq: Two times a day (BID) | ORAL | Status: DC
  Administered 2018-05-12 – 2018-05-15 (×6): 500 mg via ORAL
  Filled 2018-05-12 (×6): qty 2

## 2018-05-12 MED ORDER — ENOXAPARIN SODIUM 40 MG/0.4ML ~~LOC~~ SOLN
40.0000 mg | SUBCUTANEOUS | Status: DC
  Administered 2018-05-12 – 2018-05-16 (×5): 40 mg via SUBCUTANEOUS
  Filled 2018-05-12 (×5): qty 0.4

## 2018-05-12 MED ORDER — FUROSEMIDE 10 MG/ML IJ SOLN
60.0000 mg | Freq: Once | INTRAMUSCULAR | Status: AC
  Administered 2018-05-12: 60 mg via INTRAVENOUS
  Filled 2018-05-12: qty 6

## 2018-05-12 MED ORDER — BUDESONIDE 0.5 MG/2ML IN SUSP
0.5000 mg | Freq: Two times a day (BID) | RESPIRATORY_TRACT | Status: DC
  Administered 2018-05-12 – 2018-05-16 (×8): 0.5 mg via RESPIRATORY_TRACT
  Filled 2018-05-12 (×8): qty 2

## 2018-05-12 MED ORDER — PROMETHAZINE HCL 25 MG/ML IJ SOLN
6.2500 mg | Freq: Once | INTRAMUSCULAR | Status: AC
  Administered 2018-05-12: 6.25 mg via INTRAVENOUS
  Filled 2018-05-12: qty 1

## 2018-05-12 MED ORDER — POTASSIUM CHLORIDE CRYS ER 20 MEQ PO TBCR
40.0000 meq | EXTENDED_RELEASE_TABLET | ORAL | Status: AC
  Administered 2018-05-12 (×2): 40 meq via ORAL
  Filled 2018-05-12 (×2): qty 2

## 2018-05-12 MED ORDER — IPRATROPIUM-ALBUTEROL 0.5-2.5 (3) MG/3ML IN SOLN
3.0000 mL | RESPIRATORY_TRACT | Status: DC | PRN
  Filled 2018-05-12: qty 3

## 2018-05-12 MED ORDER — LORATADINE 10 MG PO TABS
10.0000 mg | ORAL_TABLET | Freq: Every day | ORAL | Status: DC
  Administered 2018-05-12 – 2018-05-16 (×5): 10 mg via ORAL
  Filled 2018-05-12 (×5): qty 1

## 2018-05-12 MED ORDER — CITALOPRAM HYDROBROMIDE 20 MG PO TABS
20.0000 mg | ORAL_TABLET | Freq: Every day | ORAL | Status: DC
  Administered 2018-05-12 – 2018-05-16 (×5): 20 mg via ORAL
  Filled 2018-05-12 (×5): qty 1

## 2018-05-12 MED ORDER — OSELTAMIVIR PHOSPHATE 75 MG PO CAPS
75.0000 mg | ORAL_CAPSULE | Freq: Two times a day (BID) | ORAL | Status: DC
  Administered 2018-05-12 – 2018-05-16 (×9): 75 mg via ORAL
  Filled 2018-05-12 (×9): qty 1

## 2018-05-12 MED ORDER — IPRATROPIUM-ALBUTEROL 0.5-2.5 (3) MG/3ML IN SOLN
3.0000 mL | Freq: Once | RESPIRATORY_TRACT | Status: AC
  Administered 2018-05-12: 3 mL via RESPIRATORY_TRACT
  Filled 2018-05-12: qty 3

## 2018-05-12 MED ORDER — FLUTICASONE PROPIONATE 50 MCG/ACT NA SUSP
2.0000 | Freq: Every day | NASAL | Status: DC
  Administered 2018-05-12 – 2018-05-16 (×5): 2 via NASAL
  Filled 2018-05-12 (×2): qty 16

## 2018-05-12 MED ORDER — ONDANSETRON 4 MG PO TBDP
ORAL_TABLET | ORAL | Status: AC
  Filled 2018-05-12: qty 1

## 2018-05-12 MED ORDER — SODIUM CHLORIDE 0.9% FLUSH
3.0000 mL | Freq: Two times a day (BID) | INTRAVENOUS | Status: DC
  Administered 2018-05-12 – 2018-05-16 (×9): 3 mL via INTRAVENOUS

## 2018-05-12 MED ORDER — ONDANSETRON HCL 4 MG/5ML PO SOLN: 4.0000 mg | Freq: Once | ORAL | Status: DC

## 2018-05-12 MED ORDER — ACETAMINOPHEN 325 MG PO TABS
650.0000 mg | ORAL_TABLET | Freq: Four times a day (QID) | ORAL | Status: DC | PRN
  Filled 2018-05-12 (×2): qty 2

## 2018-05-12 MED ORDER — LABETALOL HCL 5 MG/ML IV SOLN
10.0000 mg | Freq: Once | INTRAVENOUS | Status: AC
  Administered 2018-05-12: 10 mg via INTRAVENOUS
  Filled 2018-05-12: qty 4

## 2018-05-12 MED ORDER — MUPIROCIN 2 % EX OINT
1.0000 "application " | TOPICAL_OINTMENT | Freq: Two times a day (BID) | CUTANEOUS | Status: DC
  Administered 2018-05-12 – 2018-05-16 (×9): 1 via NASAL
  Filled 2018-05-12 (×2): qty 22

## 2018-05-12 MED ORDER — GUAIFENESIN-DM 100-10 MG/5ML PO SYRP
5.0000 mL | ORAL_SOLUTION | Freq: Once | ORAL | Status: AC
  Administered 2018-05-12: 5 mL via ORAL
  Filled 2018-05-12: qty 5

## 2018-05-12 MED ORDER — ACETAMINOPHEN 325 MG PO TABS
650.0000 mg | ORAL_TABLET | Freq: Four times a day (QID) | ORAL | Status: DC | PRN
  Administered 2018-05-12 – 2018-05-13 (×3): 650 mg via ORAL
  Filled 2018-05-12: qty 2

## 2018-05-12 MED ORDER — HYDROCODONE-HOMATROPINE 5-1.5 MG/5ML PO SYRP
5.0000 mL | ORAL_SOLUTION | Freq: Four times a day (QID) | ORAL | Status: DC | PRN
  Administered 2018-05-12 – 2018-05-15 (×9): 5 mL via ORAL
  Filled 2018-05-12 (×10): qty 5

## 2018-05-12 MED ORDER — MAGNESIUM SULFATE 4 GM/100ML IV SOLN
4.0000 g | Freq: Once | INTRAVENOUS | Status: AC
  Administered 2018-05-12: 4 g via INTRAVENOUS
  Filled 2018-05-12: qty 100

## 2018-05-12 MED ORDER — CHLORHEXIDINE GLUCONATE CLOTH 2 % EX PADS
6.0000 | MEDICATED_PAD | Freq: Every day | CUTANEOUS | Status: DC
  Administered 2018-05-13 – 2018-05-16 (×4): 6 via TOPICAL

## 2018-05-12 MED ORDER — SACUBITRIL-VALSARTAN 97-103 MG PO TABS
1.0000 | ORAL_TABLET | Freq: Two times a day (BID) | ORAL | Status: DC
  Administered 2018-05-12 – 2018-05-16 (×9): 1 via ORAL
  Filled 2018-05-12 (×12): qty 1

## 2018-05-12 MED ORDER — ONDANSETRON 4 MG PO TBDP
4.0000 mg | ORAL_TABLET | Freq: Once | ORAL | Status: AC
  Administered 2018-05-12: 4 mg via ORAL

## 2018-05-12 MED ORDER — INSULIN ASPART 100 UNIT/ML ~~LOC~~ SOLN
0.0000 [IU] | Freq: Three times a day (TID) | SUBCUTANEOUS | Status: DC
  Administered 2018-05-12 – 2018-05-13 (×4): 3 [IU] via SUBCUTANEOUS
  Administered 2018-05-13: 8 [IU] via SUBCUTANEOUS
  Administered 2018-05-14: 5 [IU] via SUBCUTANEOUS
  Administered 2018-05-14: 2 [IU] via SUBCUTANEOUS
  Administered 2018-05-14: 15 [IU] via SUBCUTANEOUS
  Administered 2018-05-15: 11 [IU] via SUBCUTANEOUS

## 2018-05-12 MED ORDER — POLYETHYLENE GLYCOL 3350 17 G PO PACK: 17.0000 g | PACK | Freq: Every day | ORAL | Status: DC | PRN

## 2018-05-12 MED ORDER — POTASSIUM CHLORIDE CRYS ER 20 MEQ PO TBCR
40.0000 meq | EXTENDED_RELEASE_TABLET | Freq: Every day | ORAL | Status: DC
  Filled 2018-05-12: qty 2

## 2018-05-12 MED ORDER — SODIUM CHLORIDE 0.9 % IV SOLN: 250.0000 mL | INTRAVENOUS | Status: DC | PRN

## 2018-05-12 MED ORDER — GABAPENTIN 300 MG PO CAPS
600.0000 mg | ORAL_CAPSULE | Freq: Three times a day (TID) | ORAL | Status: DC
  Administered 2018-05-12 – 2018-05-16 (×13): 600 mg via ORAL
  Filled 2018-05-12: qty 6
  Filled 2018-05-12 (×2): qty 2
  Filled 2018-05-12: qty 6
  Filled 2018-05-12 (×9): qty 2

## 2018-05-12 MED ORDER — FUROSEMIDE 10 MG/ML IJ SOLN
60.0000 mg | Freq: Three times a day (TID) | INTRAMUSCULAR | Status: DC
  Administered 2018-05-12 – 2018-05-13 (×3): 60 mg via INTRAVENOUS
  Filled 2018-05-12 (×3): qty 6

## 2018-05-12 MED ORDER — PYRIDOSTIGMINE BROMIDE 60 MG PO TABS
60.0000 mg | ORAL_TABLET | Freq: Three times a day (TID) | ORAL | Status: DC
  Administered 2018-05-12 – 2018-05-16 (×12): 60 mg via ORAL
  Filled 2018-05-12 (×18): qty 1

## 2018-05-12 MED ORDER — GUAIFENESIN ER 600 MG PO TB12
1200.0000 mg | ORAL_TABLET | Freq: Two times a day (BID) | ORAL | Status: DC
  Administered 2018-05-12 – 2018-05-16 (×9): 1200 mg via ORAL
  Filled 2018-05-12 (×9): qty 2

## 2018-05-12 MED ORDER — ONDANSETRON HCL 4 MG/2ML IJ SOLN: 4.0000 mg | Freq: Four times a day (QID) | INTRAMUSCULAR | Status: DC | PRN

## 2018-05-12 MED ORDER — INSULIN GLARGINE 100 UNIT/ML ~~LOC~~ SOLN
30.0000 [IU] | Freq: Every day | SUBCUTANEOUS | Status: DC
  Administered 2018-05-12 – 2018-05-15 (×4): 30 [IU] via SUBCUTANEOUS
  Filled 2018-05-12 (×6): qty 0.3

## 2018-05-12 MED ORDER — ENOXAPARIN SODIUM 40 MG/0.4ML ~~LOC~~ SOLN: 40.0000 mg | SUBCUTANEOUS | Status: DC

## 2018-05-12 NOTE — ED Notes (Signed)
Pt c/o headache- pt has been up to the bathroom three times since arrival to room- pt continues to cough(dry sounding, almost forced coughing noted)- Dr Alvino Chapel aware- new orders received for tylenol.

## 2018-05-12 NOTE — ED Notes (Signed)
Dr Alvino Chapel aware of pt vital signs

## 2018-05-12 NOTE — ED Triage Notes (Signed)
Pt states she has been coughing continuously since being discharged from the hospital 1 week ago.  Pt just finished steroids and antibiotics from discharge also.

## 2018-05-12 NOTE — Progress Notes (Signed)
Inpatient Diabetes Program Recommendations  AACE/ADA: New Consensus Statement on Inpatient Glycemic Control (2015)  Target Ranges:  Prepandial:   less than 140 mg/dL      Peak postprandial:   less than 180 mg/dL (1-2 hours)      Critically ill patients:  140 - 180 mg/dL   Results for Chelsey Santiago, Chelsey Santiago (MRN [id number]) as of 05/12/2018 11:06  Ref. Range 05/12/2018 05:29  Glucose Latest Ref Range: 70 - 99 mg/dL 150 (H)    Admit with: Dyspnea secondary to acute on chronic combined systolic and diastolic heart failure, presumed HCAP, upper respiratory infection  History: DM, CHF  Home DM Meds: Lantus 30 units QHS       Humalog TID per SSI       Metformin 500 mg BID  Current Orders:Novolog Moderate Correction Scale/ SSI (0-15 units) TID AC     Lantus 30 units QHS       Novolog SSI to start at 12pm today.  Lantus to start at bedtime tonight.   Note current A1c level pending.  No recommendations yet as Insulins just started.      --Will follow patient during hospitalization--  Wyn Quaker RN, MSN, CDE Diabetes Coordinator Inpatient Glycemic Control Team Team Pager: [phone] (8a-5p)

## 2018-05-12 NOTE — ED Provider Notes (Addendum)
[facility] EMERGENCY DEPARTMENT Provider Note   CSN: [id number] Arrival date & time: 05/12/18  0348     History   Chief Complaint Chief Complaint  Patient presents with  . Cough    HPI Chelsey Santiago is a 42 y.o. female.  HPI Patient presents with cough and shortness of breath.  Recently in the hospital for CHF and COPD/URI.  Has been on steroids and azithromycin.  Finished up steroids.  States she is continued to cough.  States she is been able to sleep for the last 3 days due to the cough.  States she has pain in her head and chest.  States she is unsure about whether she is carrying fluid or not.  Has had some clear sputum production and clear nasal drainage.  No abdominal pain.  Has been taking Tessalon Perles without relief.  No fevers. Past Medical History:  Diagnosis Date  . Anemia    H&H of 10.6/33 and 07/2008 and 11.9/35 and 09/2010  . Anxiety   . CHF (congestive heart failure) ([place])    a. EF 40-45% by echo in 12/2015 with cath showing normal cors  . Depression with anxiety   . Diabetes mellitus without complication ([place])   . Enlarged heart   . Hypertension   . Hypertensive heart disease 2009   Pulmonary edema postpartum; mild to moderate mitral regurgitation when hospitalized for CHF in 2009; Echocardiogram in 12/2009-no MR and normal EF; normal CXR in 09/2010  . Migraine headache   . Miscarriage 03/19/2013  . Obesity 04/16/2009  . Osteoarthritis, knee 03/29/2011  . Preeclampsia   . Pregnant   . Pulmonary edema   . Sleep apnea   . Threatened abortion in early pregnancy 03/15/2013    Patient Active Problem List   Diagnosis Date Noted  . Constipation 10/17/2017  . Bad headache   . CHF exacerbation ([place]) 09/29/2017  . Iron deficiency anemia 05/16/2017  . Vitamin D deficiency 11/25/2016  . Iron deficiency 11/25/2016  . History of acute myocardial infarction 10/05/2016  . CHF (congestive heart failure) ([place]) 05/06/2016  . Diabetes mellitus with complication  ([place]) 54/00/8676  . Leukocytosis 05/06/2016  . Neuropathy 05/06/2016  . Depression 04/16/2016  . Chronic tension-type headache, not intractable 04/16/2016  . AKI (acute kidney injury) ([place])   . Hyperkalemia   . Nonischemic cardiomyopathy ([place])   . Acute on chronic combined systolic and diastolic ACC/AHA stage C congestive heart failure ([place]) 04/03/2016  . Acute on chronic combined systolic and diastolic CHF, NYHA class 4 ([place]) 04/03/2016  . Hypertensive emergency 04/03/2016  . Cardiomyopathy due to hypertension ([place]) 12/22/2015  . Normal coronary arteries 12/22/2015  . Troponin level elevated 12/22/2015  . NSTEMI (non-ST elevated myocardial infarction) ([place]) 12/20/2015  . Dental infection 10/10/2015  . Chest pain 09/05/2015  . Systolic CHF, chronic ([place]) 09/05/2015  . LLQ pain   . Type 2 diabetes mellitus without complication ([place]) 19/50/9326  . Essential hypertension   . 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 Hypertensive urgency 04/22/2014  . Acute CHF ([place]) 04/22/2014  . S/P cesarean section 04/11/2014  . Acute pulmonary edema ([place]) 04/11/2014  . Postoperative anemia 04/11/2014  . Elevated serum creatinine 04/11/2014  . Preeclampsia, severe 04/09/2014  . Pre-eclampsia superimposed on chronic hypertension, antepartum 04/08/2014  . Dyspnea 04/08/2014  . Polyhydramnios in third trimester, antepartum 03/14/2014  . Abnormal maternal glucose tolerance, antepartum 03/11/2014  . High-risk pregnancy 03/11/2014  . Pre-existing essential hypertension complicating pregnancy 71/24/5809  . Impaired glucose tolerance during  pregnancy, antepartum 11/27/2013  . Leiomyoma of uterus 11/22/2013  . History of gestational diabetes in prior pregnancy, currently pregnant in first trimester 11/22/2013  . Hx of preeclampsia, prior pregnancy, currently pregnant 11/22/2013  . Short interval between pregnancies affecting pregnancy, antepartum 11/22/2013  . Supervision of high-risk pregnancy of elderly  primigravida (>= 53 years old at delivery), third trimester 11/22/2013  . Miscarriage 03/19/2013  . Major depressive disorder, single episode, unspecified 09/27/2011  . Hypertension   . Hypertensive cardiovascular disease   . Microcytic anemia   . Osteoarthrosis involving lower leg 03/29/2011  . OSA on CPAP 12/09/2009  . Morbid obesity ([place]) 04/16/2009    Past Surgical History:  Procedure Laterality Date  . BREAST REDUCTION SURGERY  2002  . CARDIAC CATHETERIZATION N/A 12/22/2015   Procedure: Left Heart Cath and Coronary Angiography;  Surgeon: Peter M Martinique, MD;  Location: [facility];  Service: Cardiovascular;  Laterality: N/A;  . CESAREAN SECTION N/A 04/09/2014   Procedure: CESAREAN SECTION;  Surgeon: Mora Bellman, MD;  Location: [facility];  Service: Obstetrics;  Laterality: N/A;  . CHOLECYSTECTOMY       OB History    Gravida  11   Para  6   Term  5   Preterm  1   AB  5   Living  6     SAB  3   TAB  2   Ectopic      Multiple  0   Live Births  6            Home Medications    Prior to Admission medications   Medication Sig Start Date End Date Taking? Authorizing Provider  albuterol (PROVENTIL HFA;VENTOLIN HFA) 108 (90 Base) MCG/ACT inhaler Inhale 1 puff into the lungs every 6 (six) hours as needed for wheezing or shortness of breath.    [provider]  aspirin EC 81 MG tablet Take 81 mg by mouth daily.    [provider]  benzonatate (TESSALON) 200 MG capsule Take 1 capsule (200 mg total) by mouth 3 (three) times daily. 05/04/18   Manuella Ghazi, Pratik D, DO  budesonide-formoterol (SYMBICORT) 80-4.5 MCG/ACT inhaler Inhale 2 puffs into the lungs 2 (two) times daily. 05/04/18   Manuella Ghazi, Pratik D, DO  carvedilol (COREG) 25 MG tablet Take 2 tablets (50 mg total) by mouth 2 (two) times daily with a meal. 12/06/17   Strader, [place] M, PA-C  citalopram (CELEXA) 20 MG tablet Take 20 mg by mouth daily. 08/19/17   [provider]  gabapentin  (NEURONTIN) 600 MG tablet Take 600 mg by mouth 3 (three) times daily.     [provider]  guaiFENesin-dextromethorphan (ROBITUSSIN DM) 100-10 MG/5ML syrup Take 10 mLs by mouth every 4 (four) hours as needed for cough. 05/04/18   Manuella Ghazi, Pratik D, DO  hydrALAZINE (APRESOLINE) 100 MG tablet Take 1 tablet (100 mg total) by mouth 3 (three) times daily. 07/12/17   Arnoldo Lenis, MD  insulin glargine (LANTUS) 100 UNIT/ML injection Inject 30 Units at bedtime into the skin.     [provider]  insulin lispro (HUMALOG) 100 UNIT/ML injection Inject into the skin 3 (three) times daily before meals.    [provider]  Ipratropium-Albuterol (COMBIVENT RESPIMAT) 20-100 MCG/ACT AERS respimat Inhale 1 puff into the lungs every 6 (six) hours as needed for wheezing or shortness of breath. 05/04/18   Manuella Ghazi, Pratik D, DO  Lancets (FREESTYLE) lancets Use as instructed 04/09/16   Arrien, Jimmy Picket,  MD  metFORMIN (GLUCOPHAGE) 500 MG tablet Take 500 mg 2 (two) times daily with a meal by mouth.    [provider]  naproxen (NAPROSYN) 500 MG tablet Take 500 mg by mouth 2 (two) times daily with a meal.    [provider]  nitroGLYCERIN (NITROSTAT) 0.4 MG SL tablet Place 1 tablet (0.4 mg total) under the tongue every 5 (five) minutes as needed for chest pain. 09/05/15   Kathie Dike, MD  omeprazole (PRILOSEC) 40 MG capsule Take 40 mg by mouth daily.    [provider]  ondansetron (ZOFRAN) 4 MG tablet Take 4 mg by mouth daily as needed for nausea or vomiting.    [provider]  oxyCODONE-acetaminophen (PERCOCET/ROXICET) 5-325 MG tablet Take 1 tablet by mouth every 4 (four) hours as needed for severe pain.    [provider]  potassium chloride SA (K-DUR,KLOR-CON) 20 MEQ tablet Take 1 tablet (20 mEq total) by mouth daily. 05/12/17   Holley Bouche, NP  pyridostigmine (MESTINON) 60 MG tablet Take 60 mg by mouth every 8 (eight) hours.     [provider]  sacubitril-valsartan (ENTRESTO) 97-103 MG Take 1 tablet by mouth 2 (two) times daily. 04/09/16   Arrien, Jimmy Picket, MD  spironolactone (ALDACTONE) 50 MG tablet Take 1 tablet (50 mg total) by mouth daily. 09/30/17 04/30/18  Lorella Nimrod, MD  torsemide (DEMADEX) 20 MG tablet Please take 40 mg oral every morning and 20 mg every evening. Patient taking differently: Take 20-40 mg 2 (two) times daily by mouth. Please take 40 mg oral every morning and 20 mg every evening. 05/10/16   Elgergawy, Silver Huguenin, MD  traZODone (DESYREL) 100 MG tablet Take 100 mg by mouth 3 (three) times daily after meals. 08/19/17   [provider]    Family History Family History  Problem Relation Age of Onset  . Diabetes Mother 44  . Heart disease Mother   . Hyperlipidemia Paternal Grandfather   . Hypertension Paternal Grandfather   . Heart disease Father   . Hypertension Father   . Heart disease Maternal Grandmother 60  . ADD / ADHD Son   . Hypertension Maternal Uncle   . Heart attack Brother   . Sudden death Neg Hx   . Colon cancer Neg Hx   . Celiac disease Neg Hx   . Inflammatory bowel disease Neg Hx     Social History Social History   Tobacco Use  . Smoking status: Never Smoker  . Smokeless tobacco: Never Used  Substance Use Topics  . Alcohol use: Yes    Comment: occ  . Drug use: No     Allergies   Diclofenac; Tramadol; Vicodin [hydrocodone-acetaminophen]; and Hydrocodone bitartrate   Review of Systems Review of Systems  Constitutional: Positive for appetite change.  HENT: Positive for congestion. Negative for sore throat and trouble swallowing.   Respiratory: Positive for cough and shortness of breath.   Cardiovascular: Positive for chest pain and leg swelling.  Gastrointestinal: Negative for abdominal pain.  Genitourinary: Negative for dysuria.  Musculoskeletal: Negative for back pain.  Skin: Negative for pallor.  Neurological: Positive for  headaches.  Hematological: Negative for adenopathy.  Psychiatric/Behavioral: Negative for confusion.     Physical Exam Updated Vital Signs BP (!) 200/110 (BP Location: Right Arm)   Pulse (!) 111   Temp (!) 100.4 F (38 C) (Oral)   Resp (!) 27   Ht 5\' 6"  (1.676 m)   Wt 106.6 kg   LMP  04/23/2018   SpO2 100%   BMI 37.93 kg/m   Physical Exam HENT:     Head: Normocephalic.     Nose: Congestion present.     Mouth/Throat:     Pharynx: No posterior oropharyngeal erythema.  Neck:     Musculoskeletal: No neck rigidity.  Cardiovascular:     Rate and Rhythm: Regular rhythm.  Pulmonary:     Comments: Mildly harsh breath sounds.  No focal rales. Abdominal:     Tenderness: There is no abdominal tenderness.  Musculoskeletal:     Right lower leg: Edema present.     Left lower leg: Edema present.     Comments: Some pitting edema bilateral lower extremities.  Skin:    General: Skin is warm.  Neurological:     Mental Status: She is alert. Mental status is at baseline.      ED Treatments / Results  Labs (all labs ordered are listed, but only abnormal results are displayed) Labs Reviewed  BRAIN NATRIURETIC PEPTIDE - Abnormal; Notable for the following components:      Result Value   B Natriuretic Peptide 1,812.0 (*)    All other components within normal limits  BASIC METABOLIC PANEL - Abnormal; Notable for the following components:   Potassium 3.0 (*)    Glucose, Bld 150 (*)    BUN 23 (*)    Creatinine, Ser 1.05 (*)    Calcium 8.6 (*)    All other components within normal limits  CBC WITH DIFFERENTIAL/PLATELET - Abnormal; Notable for the following components:   Hemoglobin 8.8 (*)    HCT 28.9 (*)    MCV 71.9 (*)    MCH 21.9 (*)    All other components within normal limits  TROPONIN I    EKG EKG Interpretation  Date/Time:  Friday May 12 2018 04:08:08 EST Ventricular Rate:  102 PR Interval:    QRS Duration: 95 QT Interval:  350 QTC Calculation: 456 R  Axis:   116 Text Interpretation:  Sinus tachycardia Right axis deviation Nonspecific repol abnormality, lateral leads Baseline wander in lead(s) V1 Confirmed by Davonna Belling [phone]) on 05/12/2018 4:45:44 AM   Radiology Dg Chest 2 View  Result Date: 05/12/2018 CLINICAL DATA:  Acute onset of cough. EXAM: CHEST - 2 VIEW COMPARISON:  Chest radiograph performed 05/03/2018 FINDINGS: The lungs are well-aerated. Vascular congestion is noted. Mild bibasilar opacities may reflect mild interstitial edema. There is no evidence of pleural effusion or pneumothorax. The heart is mildly enlarged. No acute osseous abnormalities are seen. IMPRESSION: Vascular congestion and mild cardiomegaly. Mild bibasilar opacities may reflect mild interstitial edema. Electronically Signed   By: Garald Balding M.D.   On: 05/12/2018 04:57    Procedures Procedures (including critical care time)  Medications Ordered in ED Medications  promethazine (PHENERGAN) injection 6.25 mg (has no administration in time range)  labetalol (NORMODYNE,TRANDATE) injection 10 mg (has no administration in time range)  ipratropium-albuterol (DUONEB) 0.5-2.5 (3) MG/3ML nebulizer solution 3 mL (has no administration in time range)  guaiFENesin-dextromethorphan (ROBITUSSIN DM) 100-10 MG/5ML syrup 5 mL (5 mLs Oral Given 05/12/18 0423)  acetaminophen (TYLENOL) tablet 650 mg (650 mg Oral Given 05/12/18 0448)  ondansetron (ZOFRAN-ODT) disintegrating tablet 4 mg (4 mg Oral Given 05/12/18 0537)     Initial Impression / Assessment and Plan / ED Course  I have reviewed the triage vital signs and the nursing notes.  Pertinent labs & imaging results that were available during my care of the patient were reviewed by me  and considered in my medical decision making (see chart for details).     Patient presents with continued shortness of breath and cough.  Continued dyspnea with hypertension.  Has been being treated at home for URI and her CHF.   Appears to be worsening CHF but likely has a URI component to this also.  Has been on steroids but states it really did not help.  Cough difficult control.  However still tachypneic and with worsening edema.  X-ray shows P CHF and BNP is above level for recent admission.  Will admit back to hospitalist.  CRITICAL CARE Performed by: Davonna Belling Total critical care time: 30 minutes Critical care time was exclusive of separately billable procedures and treating other patients. Critical care was necessary to treat or prevent imminent or life-threatening deterioration. Critical care was time spent personally by me on the following activities: development of treatment plan with patient and/or surrogate as well as nursing, discussions with consultants, evaluation of patient's response to treatment, examination of patient, obtaining history from patient or surrogate, ordering and performing treatments and interventions, ordering and review of laboratory studies, ordering and review of radiographic studies, pulse oximetry and re-evaluation of patient's condition.  Final Clinical Impressions(s) / ED Diagnoses   Final diagnoses:  Congestive heart failure, unspecified HF chronicity, unspecified heart failure type ([place])  Upper respiratory tract infection, unspecified type  Hypertension, unspecified type    ED Discharge Orders    None       Davonna Belling, MD 05/12/18 [id number]    Davonna Belling, MD 06/04/18 1439

## 2018-05-12 NOTE — H&P (Addendum)
History and Physical    [name]:[id number] DOB: 30-May-1975 DOA: 05/12/2018  PCP: Rosita Fire, MD  Patient coming from: Home  I have personally briefly reviewed patient's old medical records in [place]  Chief Complaint: Shortness of breath/cough  HPI: Chelsey Santiago is a 42 y.o. female with medical history significant of insulin-dependent diabetes mellitus, obesity, hypertension, history of cardiomyopathy, combined systolic diastolic heart failure last 2D echo with a EF of 45 to 50%, diffuse hypokinesis, grade 2 diastolic dysfunction, hypertension, depression/anxiety, obstructive sleep apnea presenting to the ED with ongoing cough, shortness of breath noted to be in hypertensive urgency.  Patient recently hospitalized from April 30, 2018 to May 04, 2018 for shortness of breath felt to be multifactorial secondary to acute on chronic combined systolic and diastolic heart failure, upper respiratory infection with probable bronchitis placed on IV azithromycin and Rocephin and subsequently discharged on a steroid taper and oral azithromycin.  Patient also received some diuresis during the hospitalization.  Patient states since discharge has not been feeling well and has been having ongoing cough to the point where she is unable to sleep, chest pain from coughing, ongoing worsening shortness of breath with lower extremity edema, associated chills, nausea, diarrhea last episode 2 days prior to admission, generalized weakness, orthopnea, paroxysmal nocturnal dyspnea, wheezing, fever with a temperature as high as 101.  Patient denies any abdominal pain, no emesis, no constipation, no dysuria, no melena, no hematemesis, no hematochezia, no syncope.  Patient states was compliant with her steroid taper and antibiotics as well as home medications.  ED Course: Patient seen in the ED, basic metabolic profile done showed a potassium of 3.0, BUN of 23, creatinine of 1.05 otherwise was  within normal limits.  BNP was elevated at 1812.  Troponin I was negative.  CBC had a hemoglobin of 8.8 otherwise was within normal limits.  X-ray done showed vascular congestion and mild cardiomegaly.  Mild bibasilar opacities may reflect mild interstitial edema.  EKG done showed sinus tachycardia with right axis deviation and some T wave inversions in 3 aVF V5 through V6.  Patient also noted to be in hypertensive urgency with a blood pressure of 201/124.  Patient given a dose of IV labetalol as well as 60 mg of IV Lasix.  Hospitalist were called to admit the patient for further evaluation and management.  Review of Systems: As per HPI otherwise 10 point review of systems negative.   Past Medical History:  Diagnosis Date  . Anemia    H&H of 10.6/33 and 07/2008 and 11.9/35 and 09/2010  . Anxiety   . CHF (congestive heart failure) ([place])    a. EF 40-45% by echo in 12/2015 with cath showing normal cors  . Depression with anxiety   . Diabetes mellitus without complication ([place])   . Enlarged heart   . Hypertension   . Hypertensive heart disease 2009   Pulmonary edema postpartum; mild to moderate mitral regurgitation when hospitalized for CHF in 2009; Echocardiogram in 12/2009-no MR and normal EF; normal CXR in 09/2010  . Migraine headache   . Miscarriage 03/19/2013  . Obesity 04/16/2009  . Osteoarthritis, knee 03/29/2011  . Preeclampsia   . Pregnant   . Pulmonary edema   . Sleep apnea   . Threatened abortion in early pregnancy 03/15/2013    Past Surgical History:  Procedure Laterality Date  . BREAST REDUCTION SURGERY  2002  . CARDIAC CATHETERIZATION N/A 12/22/2015   Procedure: Left Heart Cath and Coronary  Angiography;  Surgeon: Peter M Martinique, MD;  Location: [facility];  Service: Cardiovascular;  Laterality: N/A;  . CESAREAN SECTION N/A 04/09/2014   Procedure: CESAREAN SECTION;  Surgeon: Mora Bellman, MD;  Location: [facility];  Service: Obstetrics;  Laterality: N/A;  . CHOLECYSTECTOMY         reports that she has never smoked. She has never used smokeless tobacco. She reports current alcohol use. She reports that she does not use drugs.  Allergies  Allergen Reactions  . Diclofenac Swelling    AND POSSIBLE SYNCOPE; tolerates ibuprofen per pt  . Tramadol Nausea And Vomiting and Nausea Only    Itching (12/21); tolerates ibuprofen per pt  . Vicodin [Hydrocodone-Acetaminophen] Itching and Nausea Only  . Hydrocodone Bitartrate Itching    Family History  Problem Relation Age of Onset  . Diabetes Mother 39  . Heart disease Mother   . Hyperlipidemia Paternal Grandfather   . Hypertension Paternal Grandfather   . Heart disease Father   . Hypertension Father   . Heart disease Maternal Grandmother 60  . ADD / ADHD Son   . Hypertension Maternal Uncle   . Heart attack Brother   . Sudden death Neg Hx   . Colon cancer Neg Hx   . Celiac disease Neg Hx   . Inflammatory bowel disease Neg Hx    Unacceptable: Noncontributory, unremarkable, or negative. Acceptable: Family history reviewed and not pertinent (If you reviewed it)  Prior to Admission medications   Medication Sig Start Date End Date Taking? Authorizing Provider  albuterol (PROVENTIL HFA;VENTOLIN HFA) 108 (90 Base) MCG/ACT inhaler Inhale 1 puff into the lungs every 6 (six) hours as needed for wheezing or shortness of breath.    [provider]  aspirin EC 81 MG tablet Take 81 mg by mouth daily.    [provider]  benzonatate (TESSALON) 200 MG capsule Take 1 capsule (200 mg total) by mouth 3 (three) times daily. 05/04/18   Manuella Ghazi, Pratik D, DO  budesonide-formoterol (SYMBICORT) 80-4.5 MCG/ACT inhaler Inhale 2 puffs into the lungs 2 (two) times daily. 05/04/18   Manuella Ghazi, Pratik D, DO  carvedilol (COREG) 25 MG tablet Take 2 tablets (50 mg total) by mouth 2 (two) times daily with a meal. 12/06/17   Strader, [place] M, PA-C  citalopram (CELEXA) 20 MG tablet Take 20 mg by mouth daily. 08/19/17   [provider]  gabapentin (NEURONTIN) 600 MG tablet Take 600 mg by mouth 3 (three) times daily.     [provider]  guaiFENesin-dextromethorphan (ROBITUSSIN DM) 100-10 MG/5ML syrup Take 10 mLs by mouth every 4 (four) hours as needed for cough. 05/04/18   Manuella Ghazi, Pratik D, DO  hydrALAZINE (APRESOLINE) 100 MG tablet Take 1 tablet (100 mg total) by mouth 3 (three) times daily. 07/12/17   Arnoldo Lenis, MD  insulin glargine (LANTUS) 100 UNIT/ML injection Inject 30 Units at bedtime into the skin.     [provider]  insulin lispro (HUMALOG) 100 UNIT/ML injection Inject into the skin 3 (three) times daily before meals.    [provider]  Ipratropium-Albuterol (COMBIVENT RESPIMAT) 20-100 MCG/ACT AERS respimat Inhale 1 puff into the lungs every 6 (six) hours as needed for wheezing or shortness of breath. 05/04/18   Manuella Ghazi, Pratik D, DO  Lancets (FREESTYLE) lancets Use as instructed 04/09/16   Arrien, Jimmy Picket, MD  metFORMIN (GLUCOPHAGE) 500 MG tablet Take 500 mg 2 (two) times daily with a meal by mouth.    [provider]  naproxen (NAPROSYN) 500 MG tablet Take 500 mg by mouth 2 (two) times daily with a meal.    [provider]  nitroGLYCERIN (NITROSTAT) 0.4 MG SL tablet Place 1 tablet (0.4 mg total) under the tongue every 5 (five) minutes as needed for chest pain. 09/05/15   Kathie Dike, MD  omeprazole (PRILOSEC) 40 MG capsule Take 40 mg by mouth daily.    [provider]  ondansetron (ZOFRAN) 4 MG tablet Take 4 mg by mouth daily as needed for nausea or vomiting.    [provider]  oxyCODONE-acetaminophen (PERCOCET/ROXICET) 5-325 MG tablet Take 1 tablet by mouth every 4 (four) hours as needed for severe pain.    [provider]  potassium chloride SA (K-DUR,KLOR-CON) 20 MEQ tablet Take 1 tablet (20 mEq total) by mouth daily. 05/12/17   Holley Bouche, NP  pyridostigmine (MESTINON) 60 MG tablet Take 60 mg by mouth every  8 (eight) hours.    [provider]  sacubitril-valsartan (ENTRESTO) 97-103 MG Take 1 tablet by mouth 2 (two) times daily. 04/09/16   Arrien, Jimmy Picket, MD  spironolactone (ALDACTONE) 50 MG tablet Take 1 tablet (50 mg total) by mouth daily. 09/30/17 04/30/18  Lorella Nimrod, MD  torsemide (DEMADEX) 20 MG tablet Please take 40 mg oral every morning and 20 mg every evening. Patient taking differently: Take 20-40 mg 2 (two) times daily by mouth. Please take 40 mg oral every morning and 20 mg every evening. 05/10/16   Elgergawy, Silver Huguenin, MD  traZODone (DESYREL) 100 MG tablet Take 100 mg by mouth 3 (three) times daily after meals. 08/19/17   [provider]    Physical Exam: Vitals:   05/12/18 0700 05/12/18 0710 05/12/18 0741 05/12/18 0745  BP: (!) 167/107  (!) 187/112   Pulse: 84  94 89  Resp: 20  20 18   Temp:   99.7 F (37.6 C)   TempSrc:   Oral   SpO2: 97% 96% 99% 95%  Weight:      Height:        Constitutional: Some discomfort.  Somewhat drowsy. Vitals:   05/12/18 0700 05/12/18 0710 05/12/18 0741 05/12/18 0745  BP: (!) 167/107  (!) 187/112   Pulse: 84  94 89  Resp: 20  20 18   Temp:   99.7 F (37.6 C)   TempSrc:   Oral   SpO2: 97% 96% 99% 95%  Weight:      Height:       Eyes: PERRLA, lids and conjunctivae normal ENMT: Mucous membranes are moist. Posterior pharynx clear of any exudate or lesions.Normal dentition.  Neck: normal, supple, no masses, no thyromegaly Respiratory: Diffuse crackles throughout.  No wheezing noted.  Some coarse breath sounds scattered.  Speaking in full sentences.  No use of accessory muscles of respiration.  C Cardiovascular: Regular rate and rhythm, no murmurs / rubs / gallops.  Positive JVD.  1+ bilateral lower extremity edema.  No carotid bruits.  Abdomen: no tenderness, no masses palpated. No hepatosplenomegaly. Bowel sounds positive.  Musculoskeletal: no clubbing / cyanosis. No joint deformity upper and lower extremities. Good  ROM, no contractures. Normal muscle tone.  Skin: no rashes, lesions, ulcers. No induration Neurologic: CN 2-12 grossly intact. Sensation intact, DTR normal. Strength 5/5 in all 4.  Psychiatric: Normal judgment and insight. Alert and oriented x 3. Normal mood.   Labs on Admission: I have personally reviewed following labs and imaging studies  CBC: Recent Labs  Lab 05/12/18 0529  WBC 9.0  NEUTROABS 7.0  HGB 8.8*  HCT 28.9*  MCV 71.9*  PLT 431   Basic Metabolic Panel: Recent Labs  Lab 05/12/18 0529 05/12/18 0805  NA 135  --   K 3.0*  --   CL 99  --   CO2 25  --   GLUCOSE 150*  --   BUN 23*  --   CREATININE 1.05*  --   CALCIUM 8.6*  --   MG  --  1.4*   GFR: Estimated Creatinine Clearance: 86.2 mL/min (A) (by C-G formula based on SCr of 1.05 mg/dL (H)). Liver Function Tests: No results for input(s): AST, ALT, ALKPHOS, BILITOT, PROT, ALBUMIN in the last 168 hours. No results for input(s): LIPASE, AMYLASE in the last 168 hours. No results for input(s): AMMONIA in the last 168 hours. Coagulation Profile: No results for input(s): INR, PROTIME in the last 168 hours. Cardiac Enzymes: Recent Labs  Lab 05/12/18 0529  TROPONINI <0.03   BNP (last 3 results) No results for input(s): PROBNP in the last 8760 hours. HbA1C: No results for input(s): HGBA1C in the last 72 hours. CBG: No results for input(s): GLUCAP in the last 168 hours. Lipid Profile: No results for input(s): CHOL, HDL, LDLCALC, TRIG, CHOLHDL, LDLDIRECT in the last 72 hours. Thyroid Function Tests: No results for input(s): TSH, T4TOTAL, FREET4, T3FREE, THYROIDAB in the last 72 hours. Anemia Panel: No results for input(s): VITAMINB12, FOLATE, FERRITIN, TIBC, IRON, RETICCTPCT in the last 72 hours. Urine analysis:    Component Value Date/Time   COLORURINE YELLOW 04/30/2018 0450   APPEARANCEUR HAZY (A) 04/30/2018 0450   LABSPEC 1.028 04/30/2018 0450   PHURINE 5.0 04/30/2018 0450   GLUCOSEU NEGATIVE 04/30/2018  0450   HGBUR MODERATE (A) 04/30/2018 0450   BILIRUBINUR NEGATIVE 04/30/2018 0450   KETONESUR NEGATIVE 04/30/2018 0450   PROTEINUR 100 (A) 04/30/2018 0450   UROBILINOGEN 0.2 04/29/2014 1115   NITRITE NEGATIVE 04/30/2018 0450   LEUKOCYTESUR SMALL (A) 04/30/2018 0450    Radiological Exams on Admission: Dg Chest 2 View  Result Date: 05/12/2018 CLINICAL DATA:  Acute onset of cough. EXAM: CHEST - 2 VIEW COMPARISON:  Chest radiograph performed 05/03/2018 FINDINGS: The lungs are well-aerated. Vascular congestion is noted. Mild bibasilar opacities may reflect mild interstitial edema. There is no evidence of pleural effusion or pneumothorax. The heart is mildly enlarged. No acute osseous abnormalities are seen. IMPRESSION: Vascular congestion and mild cardiomegaly. Mild bibasilar opacities may reflect mild interstitial edema. Electronically Signed   By: Garald Balding M.D.   On: 05/12/2018 04:57    EKG: Independently reviewed.  Sinus tachycardia.  Right axis deviation.  Some T wave inversions in leads II, III, V4,V 5 through V6  Assessment/Plan Principal Problem:   Dyspnea Active Problems:   Hypertensive urgency   Hypokalemia   Morbid obesity (HCC)   OSA on CPAP   Major depressive disorder, single episode, unspecified   Type 2 diabetes mellitus without complication ([place])   Cardiomyopathy due to hypertension (HCC)   Acute on chronic combined systolic and diastolic CHF, NYHA class 4 (HCC)   Nonischemic cardiomyopathy (HCC)   Diabetes mellitus with complication (HCC)   Iron deficiency anemia   Hypomagnesemia   Headache   #1 dyspnea secondary to acute on chronic combined systolic and diastolic heart failure, presumed HCAP, upper respiratory infection. Likely multifactorial secondary to acute on chronic combined systolic and diastolic heart failure, presumed pneumonia, upper respiratory infection.  Patient does state had a temperature of 101 at home  and in the ED noted to have a temperature  of 100.4.  Patient also noted to be hypertensive urgency.  BNP elevated at 1812.0.  Troponin was negative.  Chest x-ray consistent with volume overload.  On physical exam patient with positive JVD, diffuse crackles throughout lung fields, 1+ bilateral lower extremity edema.  Admitted to the stepdown unit.  Cycle cardiac enzymes every 6 hours x3.  Patient with recent 2D echo done 04/30/2018 with a EF of 45 to 50%, diffuse hypokinesis, grade 2 diastolic dysfunction.  Check an influenza PCR.  Placed on Lasix 60 mg IV every 8 hours.  Strict I's and O's.  Daily weights.  Hold Coreg for now due to acute decompensation.  Continue home regimen of Entresto, spironolactone, hydralazine.  Check a sputum Gram stain and culture.  Check blood cultures x2.  Place empirically on IV cefepime, Pulmicort, Mucinex, scheduled duo nebs, Claritin, Flonase, PPI, Pulmicort.  Supportive care.  Follow.  Consult with cardiology for further evaluation and management.  2.  Hypertensive urgency Resume home regimen of Entresto, spironolactone and hydralazine.  If no significant improvement with blood pressure will need to place on a labetalol drip.  Follow.  3.  Diabetes mellitus type 2 Check a hemoglobin A1c.  Resume home regimen of Lantus 30 units daily.  Place on sliding scale insulin.  Continue home regimen of Neurontin.  4.  Iron deficiency anemia Follow H&H.  Will need oral iron supplementation on discharge.  5.  Hypokalemia/hypomagnesemia Replete.  Keep magnesium greater than 2.  Keep potassium greater than 4.  6.  Depression/anxiety Continue home regimen of Celexa.  7.  Obesity  8.  Nonischemic cardiomyopathy Hold Coreg secondary to acute decompensated CHF.  Resume home regimen of Entresto, spironolactone, hydralazine.  DVT prophylaxis: Lovenox Code Status: Full Family Communication: Updated patient.  No family at bedside. Disposition Plan: Home when clinically improved and medically stable. Consults called:  Cardiology Admission status: Admit to inpatient/stepdown unit   Irine Seal MD [facility] Pager [phone]  If 7PM-7AM, please contact night-coverage [email or web address] Password [facility]  05/12/2018, 9:23 AM

## 2018-05-12 NOTE — ED Notes (Signed)
Pt back to room- Ben at bedside for blood draw

## 2018-05-12 NOTE — ED Notes (Signed)
PT is taking herself off of cardiac monitor and ambulating the bathroom. PT advised she needs to let staff know when she needs to use the restroom bc we need to monitor her output from lasix administration.

## 2018-05-12 NOTE — ED Notes (Signed)
Patient transported to X-ray 

## 2018-05-12 NOTE — ED Notes (Signed)
Pt ambulatory to bathroom and back to room- steady gait- no problems observed or reported

## 2018-05-12 NOTE — ED Notes (Signed)
Pt given heart healthy meal tray

## 2018-05-12 NOTE — ED Notes (Signed)
Pt c/o nausea- Dr Alvino Chapel aware- new orders received.

## 2018-05-12 NOTE — ED Notes (Signed)
Pt ambulatory to bathroom

## 2018-05-12 NOTE — ED Notes (Signed)
Pt ambulatory to bathroom and back to room 

## 2018-05-12 NOTE — ED Notes (Signed)
ED Provider at bedside. 

## 2018-05-13 DIAGNOSIS — G4733 Obstructive sleep apnea (adult) (pediatric): Secondary | ICD-10-CM

## 2018-05-13 DIAGNOSIS — I10 Essential (primary) hypertension: Secondary | ICD-10-CM

## 2018-05-13 DIAGNOSIS — J101 Influenza due to other identified influenza virus with other respiratory manifestations: Secondary | ICD-10-CM

## 2018-05-13 DIAGNOSIS — Z9989 Dependence on other enabling machines and devices: Secondary | ICD-10-CM

## 2018-05-13 LAB — BASIC METABOLIC PANEL
Anion gap: 11 (ref 5–15)
BUN: 19 mg/dL (ref 6–20)
CALCIUM: 8.1 mg/dL — AB (ref 8.9–10.3)
CO2: 31 mmol/L (ref 22–32)
Chloride: 96 mmol/L — ABNORMAL LOW (ref 98–111)
Creatinine, Ser: 0.88 mg/dL (ref 0.44–1.00)
GFR calc Af Amer: >60 mL/min (ref >60)
GFR calc non Af Amer: >60 mL/min (ref >60)
Glucose, Bld: 183 mg/dL — ABNORMAL HIGH (ref 70–99)
Potassium: 3.1 mmol/L — ABNORMAL LOW (ref 3.5–5.1)
Sodium: 138 mmol/L (ref 135–145)

## 2018-05-13 LAB — CBC
HCT: 34.3 % — ABNORMAL LOW (ref 36.0–46.0)
Hemoglobin: 10.2 g/dL — ABNORMAL LOW (ref 12.0–15.0)
MCH: 21.6 pg — ABNORMAL LOW (ref 26.0–34.0)
MCHC: 29.7 g/dL — ABNORMAL LOW (ref 30.0–36.0)
MCV: 72.5 fL — ABNORMAL LOW (ref 80.0–100.0)
Platelets: 227 10*3/uL (ref 150–400)
RBC: 4.73 MIL/uL (ref 3.87–5.11)
RDW: 15 % (ref 11.5–15.5)
WBC: 7.5 10*3/uL (ref 4.0–10.5)
nRBC: 0 % (ref 0.0–0.2)

## 2018-05-13 LAB — URINE CULTURE: Culture: <10000 — AB

## 2018-05-13 LAB — GLUCOSE, CAPILLARY
Glucose-Capillary: 179 mg/dL — ABNORMAL HIGH (ref 70–99)
Glucose-Capillary: 269 mg/dL — ABNORMAL HIGH (ref 70–99)
Glucose-Capillary: 143 mg/dL — ABNORMAL HIGH (ref 70–99)
Glucose-Capillary: 211 mg/dL — ABNORMAL HIGH (ref 70–99)

## 2018-05-13 LAB — MAGNESIUM: Magnesium: 1.9 mg/dL (ref 1.7–2.4)

## 2018-05-13 LAB — HIV ANTIBODY (ROUTINE TESTING W REFLEX): HIV Screen 4th Generation wRfx: NONREACTIVE

## 2018-05-13 LAB — TROPONIN I: Troponin I: <0.03 ng/mL (ref <0.03)

## 2018-05-13 MED ORDER — CARVEDILOL 12.5 MG PO TABS
50.0000 mg | ORAL_TABLET | Freq: Two times a day (BID) | ORAL | Status: DC
  Administered 2018-05-14 – 2018-05-16 (×4): 50 mg via ORAL
  Filled 2018-05-13 (×5): qty 4

## 2018-05-13 MED ORDER — SODIUM CHLORIDE 0.9 % IV SOLN
INTRAVENOUS | Status: DC | PRN
  Administered 2018-05-13: 250 mL via INTRAVENOUS

## 2018-05-13 MED ORDER — TRAZODONE HCL 50 MG PO TABS
100.0000 mg | ORAL_TABLET | Freq: Every day | ORAL | Status: DC
  Administered 2018-05-13 – 2018-05-15 (×3): 100 mg via ORAL
  Filled 2018-05-13 (×3): qty 2

## 2018-05-13 MED ORDER — CARVEDILOL 12.5 MG PO TABS: 50.0000 mg | ORAL_TABLET | Freq: Two times a day (BID) | ORAL | Status: DC

## 2018-05-13 MED ORDER — FUROSEMIDE 10 MG/ML IJ SOLN
60.0000 mg | Freq: Two times a day (BID) | INTRAMUSCULAR | Status: DC
  Administered 2018-05-13 – 2018-05-14 (×2): 60 mg via INTRAVENOUS
  Filled 2018-05-13 (×2): qty 6

## 2018-05-13 MED ORDER — POTASSIUM CHLORIDE CRYS ER 20 MEQ PO TBCR
40.0000 meq | EXTENDED_RELEASE_TABLET | Freq: Every day | ORAL | Status: DC
  Administered 2018-05-14 – 2018-05-15 (×2): 40 meq via ORAL
  Filled 2018-05-13 (×3): qty 2

## 2018-05-13 MED ORDER — POTASSIUM CHLORIDE CRYS ER 20 MEQ PO TBCR
40.0000 meq | EXTENDED_RELEASE_TABLET | ORAL | Status: AC
  Administered 2018-05-13 (×2): 40 meq via ORAL
  Filled 2018-05-13: qty 2

## 2018-05-13 NOTE — Progress Notes (Signed)
PROGRESS NOTE    Chelsey Santiago  MBW:[id number] DOB: Sep 17, 1975 DOA: 05/12/2018 PCP: Rosita Fire, MD   Brief Narrative:  Patient is a 42 year old female history of insulin-dependent diabetes mellitus, hypertension, obesity, history of cardiomyopathy, combined systolic and diastolic heart failure last EF 45 to 50% with diffuse hypokinesis, grade 2 diastolic dysfunction, hypertension, depression, obstructive sleep apnea presented to the ED with ongoing cough shortness of breath and noted to be hypertensive urgency.  Patient recently hospitalized April 30, 2018 to May 04, 2018 for shortness of breath felt to be multifactorial secondary to CHF exacerbation, upper respiratory infection and probable bronchitis and discharged home on azithromycin and steroid taper.  Patient admitted for worsening shortness of breath.  Noted to be positive for influenza B.  Patient started on Tamiflu.   Assessment & Plan:   Principal Problem:   Dyspnea Active Problems:   Hypertensive urgency   Influenza B   Hypokalemia   Morbid obesity (HCC)   OSA on CPAP   Major depressive disorder, single episode, unspecified   Type 2 diabetes mellitus without complication ([place])   Cardiomyopathy due to hypertension (HCC)   Acute on chronic combined systolic and diastolic CHF, NYHA class 4 (HCC)   Nonischemic cardiomyopathy (HCC)   Diabetes mellitus with complication (HCC)   Iron deficiency anemia   Hypomagnesemia   Headache  1 dyspnea secondary to acute on chronic combined systolic and diastolic heart failure, presumed HCAP, influenza B. Likely multifactorial secondary to influenza B, acute on chronic combined systolic and diastolic heart failure, presumed pneumonia, upper respiratory infection.  Patient did state had a temperature of 101 at home and in the ED noted to have a temperature of 100.4.  Patient also noted to be hypertensive urgency on admission.  BNP elevated at 1812.0.  Troponin was negative.   Chest x-ray consistent with volume overload.  On physical exam patient with positive JVD, diffuse crackles throughout lung fields, 1+ bilateral lower extremity edema.  Admitted to the stepdown unit.  Cardiac enzymes were negative x3.   Sputum Gram stain and cultures pending.  Blood cultures pending.  Patient with recent 2D echo done 04/30/2018 with a EF of 45 to 50%, diffuse hypokinesis, grade 2 diastolic dysfunction.  Urine output of 4.8 L over the past 24 hours.  Patient is -3.944 L during this hospitalization.  Influenza PCR was positive for influenza B.  Patient subsequently started on Tamiflu.   Decrease Lasix to 60 mg IV every 12 hours.   Strict I's and O's.  Daily weights.  Continue Pulmicort, Mucinex, scheduled duo nebs, Claritin, Flonase, PPI, Pulmicort, IV cefepime, Entresto, spironolactone, hydralazine.  If remaining afebrile in the next 24 hours with continued improvement will likely transition to oral antibiotics in the next 24 hours and treat for total of 5 days of antibiotics.  Continue to hold Coreg for now due to acute decompensation.  Cardiology consultation pending.  2.  Hypertensive urgency Improved since admission.  Continue home regimen of Entresto, spironolactone and hydralazine.  Patient on IV Lasix.  Continue to hold Coreg. If no significant improvement with blood pressure will need to place on a labetalol drip.  Follow.  3.  Diabetes mellitus type 2 Hemoglobin A1c 8.5.  CBG this morning was 179.  Continue home regimen Lantus 30 units daily, and sliding scale insulin and Neurontin.   4.  Iron deficiency anemia Hemoglobin currently 10.2 from 8.8 on admission.  Will need oral iron supplementation on discharge.  5.  Hypokalemia/hypomagnesemia Replete.  Magnesium currently at 1.9 from 1.4 on admission.  Follow.   6.  Depression/anxiety Continue home regimen of Celexa.  7.  Obesity  8.  Nonischemic cardiomyopathy Continue to hold Coreg secondary to acute decompensated  CHF.    Continue home regimen of Entresto, spironolactone, hydralazine.   DVT prophylaxis: Lovenox Code Status: Full Family Communication: Updated patient.  No family at bedside. Disposition Plan: Remain in stepdown unit today.  Likely home when clinically improved.   Consultants:   Cardiology pending  Procedures:   Chest x-ray 05/12/2018    Antimicrobials:   IV cefepime 05/12/2018  Tamiflu 05/12/2018   Subjective: Patient in bed coughing.  Patient states some improvement with shortness of breath since admission however not at baseline.  Patient with complaints of some chest discomfort/chest pain and upper abdominal pain from excessive coughing.  Patient asking for her trazodone to be resumed.  Objective: Vitals:   05/13/18 0300 05/13/18 0400 05/13/18 0500 05/13/18 0802  BP: 133/87 (!) 144/95 (!) 158/103   Pulse: 74 85 81   Resp: 20 (!) 27 (!) 25   Temp:   97.8 F (36.6 C)   TempSrc:   Oral   SpO2: 93% 96% 97% 94%  Weight:   102.6 kg   Height:        Intake/Output Summary (Last 24 hours) at 05/13/2018 1016 Last data filed at 05/13/2018 0720 Gross per 24 hour  Intake 1675.57 ml  Output 6100 ml  Net -4424.43 ml   Filed Weights   05/12/18 0402 05/12/18 1202 05/13/18 0500  Weight: 106.6 kg 105.7 kg 102.6 kg    Examination:  General exam: NAD Respiratory system: Poor air movement.  Minimal expiratory wheezing.  Some coarse diffuse breath sounds.  Speaking in full sentences. Respiratory effort normal. Cardiovascular system: Tachycardia.  No JVD.  No murmurs or rubs.  No lower extremity edema.  Gastrointestinal system: Abdomen is nondistended, soft and nontender. No organomegaly or masses felt. Normal bowel sounds heard. Central nervous system: Alert and oriented. No focal neurological deficits. Extremities: Symmetric 5 x 5 power. Skin: No rashes, lesions or ulcers Psychiatry: Judgement and insight appear normal. Mood & affect appropriate.     Data  Reviewed: I have personally reviewed following labs and imaging studies  CBC: Recent Labs  Lab 05/12/18 0529 05/13/18 0459  WBC 9.0 7.5  NEUTROABS 7.0  --   HGB 8.8* 10.2*  HCT 28.9* 34.3*  MCV 71.9* 72.5*  PLT 247 267   Basic Metabolic Panel: Recent Labs  Lab 05/12/18 0529 05/12/18 0805 05/13/18 0459  NA 135  --  138  K 3.0*  --  3.1*  CL 99  --  96*  CO2 25  --  31  GLUCOSE 150*  --  183*  BUN 23*  --  19  CREATININE 1.05*  --  0.88  CALCIUM 8.6*  --  8.1*  MG  --  1.4* 1.9   GFR: Estimated Creatinine Clearance: 100.7 mL/min (by C-G formula based on SCr of 0.88 mg/dL). Liver Function Tests: No results for input(s): AST, ALT, ALKPHOS, BILITOT, PROT, ALBUMIN in the last 168 hours. No results for input(s): LIPASE, AMYLASE in the last 168 hours. No results for input(s): AMMONIA in the last 168 hours. Coagulation Profile: No results for input(s): INR, PROTIME in the last 168 hours. Cardiac Enzymes: Recent Labs  Lab 05/12/18 0529 05/12/18 1127 05/12/18 1724 05/12/18 2319  TROPONINI <0.03 <0.03 <0.03 <0.03   BNP (last 3 results) No results for input(s):  PROBNP in the last 8760 hours. HbA1C: Recent Labs    05/12/18 0529  HGBA1C 8.5*   CBG: Recent Labs  Lab 05/12/18 1201 05/12/18 1612 05/12/18 2119 05/13/18 0728  GLUCAP 152* 177* 251* 179*   Lipid Profile: No results for input(s): CHOL, HDL, LDLCALC, TRIG, CHOLHDL, LDLDIRECT in the last 72 hours. Thyroid Function Tests: No results for input(s): TSH, T4TOTAL, FREET4, T3FREE, THYROIDAB in the last 72 hours. Anemia Panel: No results for input(s): VITAMINB12, FOLATE, FERRITIN, TIBC, IRON, RETICCTPCT in the last 72 hours. Sepsis Labs: No results for input(s): PROCALCITON, LATICACIDVEN in the last 168 hours.  Recent Results (from the past 240 hour(s))  Culture, blood (Routine X 2) w Reflex to ID Panel     Status: None (Preliminary result)   Collection Time: 05/12/18  8:05 AM  Result Value Ref Range  Status   Specimen Description BLOOD RIGHT ARM  Final   Special Requests   Final    BOTTLES DRAWN AEROBIC AND ANAEROBIC Blood Culture adequate volume   Culture   Final    NO GROWTH < 24 HOURS Performed at [facility], [address], [place], [place] [address]    Report Status PENDING  Incomplete  Culture, blood (Routine X 2) w Reflex to ID Panel     Status: None (Preliminary result)   Collection Time: 05/12/18  8:10 AM  Result Value Ref Range Status   Specimen Description BLOOD LEFT ARM  Final   Special Requests   Final    BOTTLES DRAWN AEROBIC AND ANAEROBIC Blood Culture adequate volume   Culture   Final    NO GROWTH < 24 HOURS Performed at [facility], [address], [place], [place] [address]    Report Status PENDING  Incomplete  MRSA PCR Screening     Status: None   Collection Time: 05/12/18 11:58 AM  Result Value Ref Range Status   MRSA by PCR NEGATIVE NEGATIVE Final    Comment:        The GeneXpert MRSA Assay (FDA approved for NASAL specimens only), is one component of a comprehensive MRSA colonization surveillance program. It is not intended to diagnose MRSA infection nor to guide or monitor treatment for MRSA infections. Performed at [facility], [address] [place], [place] [address]          Radiology Studies: Dg Chest 2 View  Result Date: 05/12/2018 CLINICAL DATA:  Acute onset of cough. EXAM: CHEST - 2 VIEW COMPARISON:  Chest radiograph performed 05/03/2018 FINDINGS: The lungs are well-aerated. Vascular congestion is noted. Mild bibasilar opacities may reflect mild interstitial edema. There is no evidence of pleural effusion or pneumothorax. The heart is mildly enlarged. No acute osseous abnormalities are seen. IMPRESSION: Vascular congestion and mild cardiomegaly. Mild bibasilar opacities may reflect mild interstitial edema. Electronically Signed   By: Garald Balding M.D.   On: 05/12/2018 04:57        Scheduled Meds: . aspirin EC  81 mg Oral  Daily  . budesonide (PULMICORT) nebulizer solution  0.5 mg Nebulization BID  . Chlorhexidine Gluconate Cloth  6 each Topical Q0600  . citalopram  20 mg Oral Daily  . enoxaparin (LOVENOX) injection  40 mg Subcutaneous Q24H  . fluticasone  2 spray Each Nare Daily  . furosemide  60 mg Intravenous Q8H  . gabapentin  600 mg Oral TID  . guaiFENesin  1,200 mg Oral BID  . hydrALAZINE  100 mg Oral TID  . insulin aspart  0-15 Units Subcutaneous TID  WC  . insulin glargine  30 Units Subcutaneous QHS  . ipratropium-albuterol  3 mL Nebulization Q6H  . Living Better with Heart Failure Book   Does not apply Once  . loratadine  10 mg Oral Daily  . mupirocin ointment  1 application Nasal BID  . naproxen  500 mg Oral BID WC  . oseltamivir  75 mg Oral BID  . pantoprazole  40 mg Oral Daily  . potassium chloride  40 mEq Oral Q4H  . [START ON 05/14/2018] potassium chloride  40 mEq Oral Daily  . pyridostigmine  60 mg Oral Q8H  . sacubitril-valsartan  1 tablet Oral BID  . sodium chloride flush  3 mL Intravenous Q12H  . sodium chloride flush  3 mL Intravenous Q12H  . spironolactone  50 mg Oral Daily  . traZODone  100 mg Oral QHS   Continuous Infusions: . sodium chloride    . ceFEPime (MAXIPIME) IV 1 g (05/13/18 0515)     LOS: 1 day    Time spent: 40 minutes    Irine Seal, MD [facility] Pager [phone]  If 7PM-7AM, please contact night-coverage [email or web address] Password [email or web address] 05/13/2018, 10:16 AM

## 2018-05-14 LAB — BASIC METABOLIC PANEL
Anion gap: 10 (ref 5–15)
BUN: 24 mg/dL — ABNORMAL HIGH (ref 6–20)
CHLORIDE: 99 mmol/L (ref 98–111)
CO2: 30 mmol/L (ref 22–32)
Calcium: 8.6 mg/dL — ABNORMAL LOW (ref 8.9–10.3)
Creatinine, Ser: 1.13 mg/dL — ABNORMAL HIGH (ref 0.44–1.00)
GFR calc Af Amer: >60 mL/min (ref >60)
GFR calc non Af Amer: 60 mL/min — ABNORMAL LOW (ref >60)
Glucose, Bld: 186 mg/dL — ABNORMAL HIGH (ref 70–99)
Potassium: 4.2 mmol/L (ref 3.5–5.1)
Sodium: 139 mmol/L (ref 135–145)

## 2018-05-14 LAB — STREP PNEUMONIAE URINARY ANTIGEN: Strep Pneumo Urinary Antigen: NEGATIVE

## 2018-05-14 LAB — BRAIN NATRIURETIC PEPTIDE: B Natriuretic Peptide: 205 pg/mL — ABNORMAL HIGH (ref 0.0–100.0)

## 2018-05-14 LAB — CBC
HCT: 37.2 % (ref 36.0–46.0)
HEMOGLOBIN: 11.1 g/dL — AB (ref 12.0–15.0)
MCH: 21.7 pg — ABNORMAL LOW (ref 26.0–34.0)
MCHC: 29.8 g/dL — ABNORMAL LOW (ref 30.0–36.0)
MCV: 72.8 fL — ABNORMAL LOW (ref 80.0–100.0)
Platelets: 214 10*3/uL (ref 150–400)
RBC: 5.11 MIL/uL (ref 3.87–5.11)
RDW: 15.2 % (ref 11.5–15.5)
WBC: 8.7 10*3/uL (ref 4.0–10.5)
nRBC: 0 % (ref 0.0–0.2)

## 2018-05-14 LAB — GLUCOSE, CAPILLARY
GLUCOSE-CAPILLARY: 148 mg/dL — AB (ref 70–99)
Glucose-Capillary: 222 mg/dL — ABNORMAL HIGH (ref 70–99)
Glucose-Capillary: 389 mg/dL — ABNORMAL HIGH (ref 70–99)
Glucose-Capillary: 412 mg/dL — ABNORMAL HIGH (ref 70–99)

## 2018-05-14 LAB — MAGNESIUM: Magnesium: 1.9 mg/dL (ref 1.7–2.4)

## 2018-05-14 MED ORDER — METHYLPREDNISOLONE SODIUM SUCC 125 MG IJ SOLR: 80.0000 mg | Freq: Once | INTRAMUSCULAR | Status: DC

## 2018-05-14 MED ORDER — CEFDINIR 300 MG PO CAPS
300.0000 mg | ORAL_CAPSULE | Freq: Two times a day (BID) | ORAL | Status: DC
  Administered 2018-05-14 – 2018-05-16 (×5): 300 mg via ORAL
  Filled 2018-05-14 (×5): qty 1

## 2018-05-14 MED ORDER — INSULIN ASPART 100 UNIT/ML ~~LOC~~ SOLN
4.0000 [IU] | Freq: Three times a day (TID) | SUBCUTANEOUS | Status: DC
  Administered 2018-05-15 – 2018-05-16 (×5): 4 [IU] via SUBCUTANEOUS

## 2018-05-14 MED ORDER — TORSEMIDE 20 MG PO TABS: 20.0000 mg | ORAL_TABLET | Freq: Every evening | ORAL | Status: DC

## 2018-05-14 MED ORDER — METHYLPREDNISOLONE SODIUM SUCC 125 MG IJ SOLR
60.0000 mg | Freq: Two times a day (BID) | INTRAMUSCULAR | Status: DC
  Administered 2018-05-14 – 2018-05-16 (×5): 60 mg via INTRAVENOUS
  Filled 2018-05-14 (×5): qty 2

## 2018-05-14 MED ORDER — INSULIN ASPART 100 UNIT/ML ~~LOC~~ SOLN
3.0000 [IU] | Freq: Three times a day (TID) | SUBCUTANEOUS | Status: DC
  Administered 2018-05-14 (×2): 3 [IU] via SUBCUTANEOUS

## 2018-05-14 MED ORDER — SODIUM CHLORIDE 0.9 % IV BOLUS
500.0000 mL | Freq: Once | INTRAVENOUS | Status: AC
  Administered 2018-05-14: 500 mL via INTRAVENOUS

## 2018-05-14 MED ORDER — TORSEMIDE 20 MG PO TABS
40.0000 mg | ORAL_TABLET | Freq: Every morning | ORAL | Status: DC
  Filled 2018-05-14: qty 2

## 2018-05-14 MED ORDER — FUROSEMIDE 10 MG/ML IJ SOLN: 60.0000 mg | Freq: Two times a day (BID) | INTRAMUSCULAR | Status: DC

## 2018-05-14 NOTE — Progress Notes (Signed)
Inpatient Diabetes Program Recommendations  AACE/ADA: New Consensus Statement on Inpatient Glycemic Control (2015)  Target Ranges:  Prepandial:   less than 140 mg/dL      Peak postprandial:   less than 180 mg/dL (1-2 hours)      Critically ill patients:  140 - 180 mg/dL   Lab Results  Component Value Date   GLUCAP 389 (H) 05/14/2018   HGBA1C 8.5 (H) 05/12/2018    Review of Glycemic Control  Blood sugars > 180 mg/dL. Needs meal coverage insulin and HS correction.  Inpatient Diabetes Program Recommendations:     Add HS correction. Increase Novolog to 4 units tidwc for meal coverage insulin if pt eats > 50% meal.  Will continue to follow.  Thank you. Lorenda Peck, RD, LDN, CDE Inpatient Diabetes Coordinator [phone]

## 2018-05-14 NOTE — Progress Notes (Signed)
Pt ambulated 10 feet in room on RA o2 sats remained >95%

## 2018-05-14 NOTE — Progress Notes (Signed)
PROGRESS NOTE    Chelsey Santiago  ZCH:[id number] DOB: Mar 21, 1976 DOA: 05/12/2018 PCP: Rosita Fire, MD   Brief Narrative:  Patient is a 42 year old female history of insulin-dependent diabetes mellitus, hypertension, obesity, history of cardiomyopathy, combined systolic and diastolic heart failure last EF 45 to 50% with diffuse hypokinesis, grade 2 diastolic dysfunction, hypertension, depression, obstructive sleep apnea presented to the ED with ongoing cough shortness of breath and noted to be hypertensive urgency.  Patient recently hospitalized April 30, 2018 to May 04, 2018 for shortness of breath felt to be multifactorial secondary to CHF exacerbation, upper respiratory infection and probable bronchitis and discharged home on azithromycin and steroid taper.  Patient admitted for worsening shortness of breath.  Noted to be positive for influenza B.  Patient started on Tamiflu.   Assessment & Plan:   Principal Problem:   Dyspnea Active Problems:   Hypertensive urgency   Influenza B   Hypokalemia   Morbid obesity (HCC)   OSA on CPAP   Major depressive disorder, single episode, unspecified   Type 2 diabetes mellitus without complication ([place])   Cardiomyopathy due to hypertension (HCC)   Acute on chronic combined systolic and diastolic CHF, NYHA class 4 (HCC)   Nonischemic cardiomyopathy (HCC)   Diabetes mellitus with complication (HCC)   Iron deficiency anemia   Hypomagnesemia   Headache  1 dyspnea secondary to acute on chronic combined systolic and diastolic heart failure, presumed HCAP, influenza B. Likely multifactorial secondary to influenza B, acute on chronic combined systolic and diastolic heart failure, presumed pneumonia, upper respiratory infection.  Patient did state had a temperature of 101 at home and in the ED noted to have a temperature of 100.4.  Patient also noted to be hypertensive urgency on admission.  BNP elevated at 1812.0.  Troponin was negative.   Chest x-ray consistent with volume overload.  On physical exam on admission patient with positive JVD, diffuse crackles throughout lung fields, 1+ bilateral lower extremity edema.  Admitted to the stepdown unit.  Cardiac enzymes were negative x3.   Sputum Gram stain and cultures pending with no growth to date.  Blood cultures pending.  Patient with recent 2D echo done 04/30/2018 with a EF of 45 to 50%, diffuse hypokinesis, grade 2 diastolic dysfunction.  Urine output of 2.7 L over the past 24 hours.  Patient is - 4.255.5 L during this hospitalization.  Influenza PCR was positive for influenza B.  Patient subsequently started on Tamiflu.   Continue Lasix 60 mg IV every 12 hours and likely transition back to home dose oral Demadex tomorrow.   Strict I's and O's.  Daily weights.  Continue Pulmicort, Mucinex, scheduled duo nebs, Claritin, Flonase, PPI, Pulmicort, Entresto, spironolactone, Coreg, hydralazine.  Discontinue IV cefepime and placed on oral Vantin to complete a 5 day course of antibiotic treatment.  Place on Solu-Medrol 60 mg IV every 12 hours and if continued improvement could likely transition to oral prednisone taper.  2.  Hypertensive urgency Improved since admission.  Continue home regimen of Entresto, spironolactone, Coreg and hydralazine.  Patient on IV Lasix.  Follow.  3.  Diabetes mellitus type 2 Hemoglobin A1c 8.5.  CBG this morning was 222.  Continue home regimen Lantus 30 units daily, and sliding scale insulin and Neurontin.  We will add home regimen of 3 units of meal coverage insulin.  4.  Iron deficiency anemia Hemoglobin currently 10.2 from 8.8 on admission.  Will need oral iron supplementation on discharge.  5.  Hypokalemia/hypomagnesemia Repleted.  Magnesium currently at 1.9 from 1.4 on admission.  Follow.   6.  Depression/anxiety Continue Celexa.  7.  Obesity  8.  Nonischemic cardiomyopathy Resume home regimen of Coreg.  Continue home regimen of Entresto,  spironolactone, hydralazine.  Mobilize.   DVT prophylaxis: Lovenox Code Status: Full Family Communication: Updated patient.  No family at bedside. Disposition Plan: Transfer to telemetry.  Likely home when clinically improved hopefully in the next 1 to 2 days.     Consultants:   None  Procedures:   Chest x-ray 05/12/2018    Antimicrobials:   IV cefepime 05/12/2018>>>> 05/14/2018  Tamiflu 05/12/2018  Vantin 05/14/2018   Subjective: Patient in bed with some coughing.  Patient states coughing improved.  Patient denies any chest pain.  Still with some shortness of breath however improving and not at baseline.  States Hycodan helping with cough.  States she slept well last night.    Objective: Vitals:   05/14/18 0500 05/14/18 0600 05/14/18 0700 05/14/18 0747  BP: 123/67 123/81 115/71   Pulse: 97 94 93   Resp: (!) 25 (!) 25 20   Temp:    98.1 F (36.7 C)  TempSrc:    Oral  SpO2: 92% 96% 96%   Weight:      Height:        Intake/Output Summary (Last 24 hours) at 05/14/2018 0942 Last data filed at 05/14/2018 0500 Gross per 24 hour  Intake 1088.89 ml  Output 1400 ml  Net -311.11 ml   Filed Weights   05/12/18 1202 05/13/18 0500 05/14/18 0457  Weight: 105.7 kg 102.6 kg 102.6 kg    Examination:  General exam: NAD Respiratory system: Fair air movement.  Minimal to mild expiratory wheezing.  Some diffuse crackles.  Some scattered coarse breath sounds.  Speaking in full sentences.  No use of accessory muscles of respiration. Respiratory effort normal. Cardiovascular system: Regular rate and rhythm no murmurs rubs or gallops.  No JVD.  No lower extremity edema.   Gastrointestinal system: Abdomen is soft, nontender, nondistended, positive bowel sounds.  No rebound.  No guarding.  Central nervous system: Alert and oriented. No focal neurological deficits. Extremities: Symmetric 5 x 5 power. Skin: No rashes, lesions or ulcers Psychiatry: Judgement and insight appear  normal. Mood & affect appropriate.     Data Reviewed: I have personally reviewed following labs and imaging studies  CBC: Recent Labs  Lab 05/12/18 0529 05/13/18 0459 05/14/18 0431  WBC 9.0 7.5 8.7  NEUTROABS 7.0  --   --   HGB 8.8* 10.2* 11.1*  HCT 28.9* 34.3* 37.2  MCV 71.9* 72.5* 72.8*  PLT 247 227 509   Basic Metabolic Panel: Recent Labs  Lab 05/12/18 0529 05/12/18 0805 05/13/18 0459 05/14/18 0431  NA 135  --  138 139  K 3.0*  --  3.1* 4.2  CL 99  --  96* 99  CO2 25  --  31 30  GLUCOSE 150*  --  183* 186*  BUN 23*  --  19 24*  CREATININE 1.05*  --  0.88 1.13*  CALCIUM 8.6*  --  8.1* 8.6*  MG  --  1.4* 1.9 1.9   GFR: Estimated Creatinine Clearance: 78.4 mL/min (A) (by C-G formula based on SCr of 1.13 mg/dL (H)). Liver Function Tests: No results for input(s): AST, ALT, ALKPHOS, BILITOT, PROT, ALBUMIN in the last 168 hours. No results for input(s): LIPASE, AMYLASE in the last 168 hours. No results for input(s): AMMONIA in the last 168 hours.  Coagulation Profile: No results for input(s): INR, PROTIME in the last 168 hours. Cardiac Enzymes: Recent Labs  Lab 05/12/18 0529 05/12/18 1127 05/12/18 1724 05/12/18 2319  TROPONINI <0.03 <0.03 <0.03 <0.03   BNP (last 3 results) No results for input(s): PROBNP in the last 8760 hours. HbA1C: Recent Labs    05/12/18 0529  HGBA1C 8.5*   CBG: Recent Labs  Lab 05/13/18 0728 05/13/18 1117 05/13/18 1600 05/13/18 2107 05/14/18 0749  GLUCAP 179* 269* 143* 211* 222*   Lipid Profile: No results for input(s): CHOL, HDL, LDLCALC, TRIG, CHOLHDL, LDLDIRECT in the last 72 hours. Thyroid Function Tests: No results for input(s): TSH, T4TOTAL, FREET4, T3FREE, THYROIDAB in the last 72 hours. Anemia Panel: No results for input(s): VITAMINB12, FOLATE, FERRITIN, TIBC, IRON, RETICCTPCT in the last 72 hours. Sepsis Labs: No results for input(s): PROCALCITON, LATICACIDVEN in the last 168 hours.  Recent Results (from the  past 240 hour(s))  Culture, Urine     Status: Abnormal   Collection Time: 05/12/18  7:48 AM  Result Value Ref Range Status   Specimen Description URINE, RANDOM  Final   Special Requests   Final    NONE Performed at [facility], [address], [place], [place] [address]    Culture <10,000 COLONIES/mL INSIGNIFICANT GROWTH (A)  Final   Report Status 05/13/2018 FINAL  Final  Culture, blood (Routine X 2) w Reflex to ID Panel     Status: None (Preliminary result)   Collection Time: 05/12/18  8:05 AM  Result Value Ref Range Status   Specimen Description BLOOD RIGHT ARM  Final   Special Requests   Final    BOTTLES DRAWN AEROBIC AND ANAEROBIC Blood Culture adequate volume   Culture   Final    NO GROWTH 2 DAYS Performed at [facility], [address] [place], [place] [address]    Report Status PENDING  Incomplete  Culture, blood (Routine X 2) w Reflex to ID Panel     Status: None (Preliminary result)   Collection Time: 05/12/18  8:10 AM  Result Value Ref Range Status   Specimen Description BLOOD LEFT ARM  Final   Special Requests   Final    BOTTLES DRAWN AEROBIC AND ANAEROBIC Blood Culture adequate volume   Culture   Final    NO GROWTH 2 DAYS Performed at [facility], [address], [place], [place] [address]    Report Status PENDING  Incomplete  MRSA PCR Screening     Status: None   Collection Time: 05/12/18 11:58 AM  Result Value Ref Range Status   MRSA by PCR NEGATIVE NEGATIVE Final    Comment:        The GeneXpert MRSA Assay (FDA approved for NASAL specimens only), is one component of a comprehensive MRSA colonization surveillance program. It is not intended to diagnose MRSA infection nor to guide or monitor treatment for MRSA infections. Performed at [facility], [address] [place], [place] [address]          Radiology Studies: No results found.      Scheduled Meds: . aspirin EC  81 mg Oral Daily  . budesonide (PULMICORT) nebulizer solution  0.5 mg  Nebulization BID  . carvedilol  50 mg Oral BID WC  . Chlorhexidine Gluconate Cloth  6 each Topical Q0600  . citalopram  20 mg Oral Daily  . enoxaparin (LOVENOX) injection  40 mg Subcutaneous Q24H  . fluticasone  2 spray Each Nare Daily  . furosemide  60 mg  Intravenous Q12H  . gabapentin  600 mg Oral TID  . guaiFENesin  1,200 mg Oral BID  . hydrALAZINE  100 mg Oral TID  . insulin aspart  0-15 Units Subcutaneous TID WC  . insulin glargine  30 Units Subcutaneous QHS  . ipratropium-albuterol  3 mL Nebulization Q6H  . Living Better with Heart Failure Book   Does not apply Once  . loratadine  10 mg Oral Daily  . methylPREDNISolone (SOLU-MEDROL) injection  60 mg Intravenous Q12H  . mupirocin ointment  1 application Nasal BID  . naproxen  500 mg Oral BID WC  . oseltamivir  75 mg Oral BID  . pantoprazole  40 mg Oral Daily  . potassium chloride  40 mEq Oral Daily  . pyridostigmine  60 mg Oral Q8H  . sacubitril-valsartan  1 tablet Oral BID  . sodium chloride flush  3 mL Intravenous Q12H  . spironolactone  50 mg Oral Daily  . traZODone  100 mg Oral QHS   Continuous Infusions: . sodium chloride 10 mL/hr at 05/14/18 0451  . ceFEPime (MAXIPIME) IV 200 mL/hr at 05/14/18 0500     LOS: 2 days    Time spent: 40 minutes    Irine Seal, MD [facility] Pager [phone]  If 7PM-7AM, please contact night-coverage [email or web address] Password [facility] 05/14/2018, 9:42 AM

## 2018-05-15 LAB — CBC
HCT: 39 % (ref 36.0–46.0)
Hemoglobin: 11.5 g/dL — ABNORMAL LOW (ref 12.0–15.0)
MCH: 21.8 pg — ABNORMAL LOW (ref 26.0–34.0)
MCHC: 29.5 g/dL — ABNORMAL LOW (ref 30.0–36.0)
MCV: 74 fL — ABNORMAL LOW (ref 80.0–100.0)
Platelets: 265 10*3/uL (ref 150–400)
RBC: 5.27 MIL/uL — AB (ref 3.87–5.11)
RDW: 15.2 % (ref 11.5–15.5)
WBC: 9.7 10*3/uL (ref 4.0–10.5)
nRBC: 0 % (ref 0.0–0.2)

## 2018-05-15 LAB — GLUCOSE, CAPILLARY
GLUCOSE-CAPILLARY: 446 mg/dL — AB (ref 70–99)
GLUCOSE-CAPILLARY: 272 mg/dL — AB (ref 70–99)
Glucose-Capillary: 338 mg/dL — ABNORMAL HIGH (ref 70–99)
Glucose-Capillary: 364 mg/dL — ABNORMAL HIGH (ref 70–99)
Glucose-Capillary: 294 mg/dL — ABNORMAL HIGH (ref 70–99)

## 2018-05-15 LAB — LEGIONELLA PNEUMOPHILA SEROGP 1 UR AG: L. pneumophila Serogp 1 Ur Ag: NEGATIVE

## 2018-05-15 LAB — BASIC METABOLIC PANEL
Anion gap: 10 (ref 5–15)
BUN: 33 mg/dL — ABNORMAL HIGH (ref 6–20)
CO2: 24 mmol/L (ref 22–32)
Calcium: 9.2 mg/dL (ref 8.9–10.3)
Chloride: 100 mmol/L (ref 98–111)
Creatinine, Ser: 1.3 mg/dL — ABNORMAL HIGH (ref 0.44–1.00)
GFR calc non Af Amer: 51 mL/min — ABNORMAL LOW (ref >60)
GFR, EST AFRICAN AMERICAN: 59 mL/min — AB (ref >60)
Glucose, Bld: 325 mg/dL — ABNORMAL HIGH (ref 70–99)
Potassium: 4.7 mmol/L (ref 3.5–5.1)
Sodium: 134 mmol/L — ABNORMAL LOW (ref 135–145)

## 2018-05-15 MED ORDER — TORSEMIDE 20 MG PO TABS
20.0000 mg | ORAL_TABLET | Freq: Every day | ORAL | Status: DC
  Administered 2018-05-15: 20 mg via ORAL
  Filled 2018-05-15: qty 1

## 2018-05-15 MED ORDER — INSULIN ASPART 100 UNIT/ML ~~LOC~~ SOLN
0.0000 [IU] | Freq: Three times a day (TID) | SUBCUTANEOUS | Status: DC
  Administered 2018-05-15 (×2): 20 [IU] via SUBCUTANEOUS
  Administered 2018-05-16: 15 [IU] via SUBCUTANEOUS
  Administered 2018-05-16: 20 [IU] via SUBCUTANEOUS

## 2018-05-15 MED ORDER — INSULIN ASPART 100 UNIT/ML ~~LOC~~ SOLN
0.0000 [IU] | Freq: Every day | SUBCUTANEOUS | Status: DC
  Administered 2018-05-15: 3 [IU] via SUBCUTANEOUS

## 2018-05-15 MED ORDER — TORSEMIDE 20 MG PO TABS
40.0000 mg | ORAL_TABLET | Freq: Every day | ORAL | Status: DC
  Administered 2018-05-15 – 2018-05-16 (×2): 40 mg via ORAL
  Filled 2018-05-15 (×2): qty 2

## 2018-05-15 NOTE — Progress Notes (Signed)
PROGRESS NOTE  Chelsey Santiago QQP:[id number] DOB: 02-03-1976 DOA: 05/12/2018 PCP: Rosita Fire, MD  Brief History:  Patient is a 42 year old female history of insulin-dependent diabetes mellitus, hypertension, obesity, history of cardiomyopathy, combined systolic and diastolic heart failure last EF 45 to 50% with diffuse hypokinesis, grade 2 diastolic dysfunction, hypertension, depression, obstructive sleep apnea presented to the ED with ongoing cough shortness of breath and noted to be hypertensive urgency.  Patient recently hospitalized April 30, 2018 to May 04, 2018 for shortness of breath felt to be multifactorial secondary to CHF exacerbation, upper respiratory infection and probable bronchitis and discharged home on azithromycin and steroid taper.  Patient admitted for worsening shortness of breath.  Noted to be positive for influenza B.  Patient started on Tamiflu.  Assessment/Plan: acute on chronic combined systolic and diastolic heart failure,  -multifactorial secondary to influenza B, acute on chronic combined systolic and diastolic heart failure, presumed pneumonia, upper respiratory infection.  -stated had a temperature of 101 at home and in the ED noted to have a temperature of 100.4.  -Patient also noted to be hypertensive urgency on admission. -BNP elevated at 1812.0.  -Troponin was negative x3.  -Chest x-ray and exam consistent with volume overload. -Admitted to the stepdown unit.  -Sputum Gram stain and cultures pending with no growth to date.   -2D echo done 04/30/2018 with a EF of 45 to 50%, diffuse hypokinesis, grade 2 diastolic dysfunction.  Urine output of 2.7 L over the past 24 hours.   -Lasix 60 mg IV every 12 hours>>>torsemide 12/30 -Strict I's and O's. Daily weights.  -spironolactone, Coreg, hydralazine, Entresto -Discontinue potassium supplementation as potassium is trending up  Influenza B with respiratory manifestations/HCAP -Influenza  PCR was positive for influenza B.   -started on Tamiflu.  Continue  =Continue Pulmicort, Mucinex, scheduled duo nebs, Claritin, Flonase, PPI, Pulmicort, .   -Discontinue IV cefepime and placed on oral Vantin to complete a 5 day course of antibiotic treatment.   -Placed on Solu-Medrol 60 mg IV every 12 hours  Hypertensive urgency -Improved since admission.   -Continue home regimen of Entresto, spironolactone, Coreg and hydralazine.   Diabetes mellitus type 2, uncontrolled with hyperglycemia -Hemoglobin A1c 8.5.   -Continue home regimen Lantus 30 units daily, and sliding scale insulin and Neurontin.   -Changed to resistant scale -NovoLog 4 units with meals  Iron deficiency anemia -Baseline hemoglobin ~10  Hypokalemia/hypomagnesemia Repleted.    -Magnesium currently at 1.9 from 1.4 on admission.  Follow.   Depression/anxiety Continue Celexa.  Obesity -BMI 37.04  Nonischemic cardiomyopathy Resume home regimen of Coreg.  Continue home regimen of Entresto, spironolactone, hydralazine.   Disposition Plan:   Home 12/31 if stable Family Communication:  No Family at bedside  Consultants:  none  Code Status:  FULL   DVT Prophylaxis:  [place] Lovenox   Procedures: As Listed in Progress Note Above  Antibiotics:  IV cefepime 05/12/2018>>>> 05/14/2018  Tamiflu 05/12/2018  cefdinir 05/14/2018       Subjective: Patient overall feeling better but she still has some dyspnea with mild exertion.  She denies any chest pain, nausea, vomiting, diarrhea, abdominal pain, dysuria, hematuria but there is no rashes.  Objective: Vitals:   05/15/18 0600 05/15/18 0700 05/15/18 0756 05/15/18 0809  BP: (!) 157/86 (!) 155/89    Pulse: 65 65 72   Resp: 19 17 15    Temp:   (!) 97.2 F (36.2 C)   TempSrc:  Oral   SpO2: 98% 95% 100% 100%  Weight:      Height:        Intake/Output Summary (Last 24 hours) at 05/15/2018 0851 Last data filed at 05/15/2018 0500 Gross per 24 hour   Intake 643.01 ml  Output 1700 ml  Net -1056.99 ml   Weight change: 1.5 kg Exam:   General:  Pt is alert, follows commands appropriately, not in acute distress  HEENT: No icterus, No thrush, No neck mass, [place]/AT  Cardiovascular: RRR, S1/S2, no rubs, no gallops  Respiratory: Bilateral expiratory wheeze.  Good air movement.  Abdomen: Soft/+BS, non tender, non distended, no guarding  Extremities: Trace lower extremity edema, No lymphangitis, No petechiae, No rashes, no synovitis   Data Reviewed: I have personally reviewed following labs and imaging studies Basic Metabolic Panel: Recent Labs  Lab 05/12/18 0529 05/12/18 0805 05/13/18 0459 05/14/18 0431 05/15/18 0401  NA 135  --  138 139 134*  K 3.0*  --  3.1* 4.2 4.7  CL 99  --  96* 99 100  CO2 25  --  31 30 24   GLUCOSE 150*  --  183* 186* 325*  BUN 23*  --  19 24* 33*  CREATININE 1.05*  --  0.88 1.13* 1.30*  CALCIUM 8.6*  --  8.1* 8.6* 9.2  MG  --  1.4* 1.9 1.9  --    Liver Function Tests: No results for input(s): AST, ALT, ALKPHOS, BILITOT, PROT, ALBUMIN in the last 168 hours. No results for input(s): LIPASE, AMYLASE in the last 168 hours. No results for input(s): AMMONIA in the last 168 hours. Coagulation Profile: No results for input(s): INR, PROTIME in the last 168 hours. CBC: Recent Labs  Lab 05/12/18 0529 05/13/18 0459 05/14/18 0431 05/15/18 0401  WBC 9.0 7.5 8.7 9.7  NEUTROABS 7.0  --   --   --   HGB 8.8* 10.2* 11.1* 11.5*  HCT 28.9* 34.3* 37.2 39.0  MCV 71.9* 72.5* 72.8* 74.0*  PLT 247 227 214 265   Cardiac Enzymes: Recent Labs  Lab 05/12/18 0529 05/12/18 1127 05/12/18 1724 05/12/18 2319  TROPONINI <0.03 <0.03 <0.03 <0.03   BNP: Invalid input(s): POCBNP CBG: Recent Labs  Lab 05/14/18 0749 05/14/18 1108 05/14/18 1657 05/14/18 2137 05/15/18 0752  GLUCAP 222* 389* 148* 412* 338*   HbA1C: No results for input(s): HGBA1C in the last 72 hours. Urine analysis:    Component Value  Date/Time   COLORURINE COLORLESS (A) 05/12/2018 0748   APPEARANCEUR CLEAR 05/12/2018 0748   LABSPEC 1.004 (L) 05/12/2018 0748   PHURINE 6.0 05/12/2018 0748   GLUCOSEU NEGATIVE 05/12/2018 0748   HGBUR NEGATIVE 05/12/2018 0748   BILIRUBINUR NEGATIVE 05/12/2018 0748   KETONESUR NEGATIVE 05/12/2018 0748   PROTEINUR NEGATIVE 05/12/2018 0748   UROBILINOGEN 0.2 04/29/2014 1115   NITRITE NEGATIVE 05/12/2018 0748   LEUKOCYTESUR NEGATIVE 05/12/2018 0748   Sepsis Labs: @LABRCNTIP (procalcitonin:4,lacticidven:4) ) Recent Results (from the past 240 hour(s))  Culture, Urine     Status: Abnormal   Collection Time: 05/12/18  7:48 AM  Result Value Ref Range Status   Specimen Description URINE, RANDOM  Final   Special Requests   Final    NONE Performed at [facility], [address] [place], [place] [address]    Culture <10,000 COLONIES/mL INSIGNIFICANT GROWTH (A)  Final   Report Status 05/13/2018 FINAL  Final  Culture, blood (Routine X 2) w Reflex to ID Panel     Status: None (Preliminary result)   Collection  Time: 05/12/18  8:05 AM  Result Value Ref Range Status   Specimen Description BLOOD RIGHT ARM  Final   Special Requests   Final    BOTTLES DRAWN AEROBIC AND ANAEROBIC Blood Culture adequate volume   Culture   Final    NO GROWTH 3 DAYS Performed at [facility], [address] [place], [place] [address]    Report Status PENDING  Incomplete  Culture, blood (Routine X 2) w Reflex to ID Panel     Status: None (Preliminary result)   Collection Time: 05/12/18  8:10 AM  Result Value Ref Range Status   Specimen Description BLOOD LEFT ARM  Final   Special Requests   Final    BOTTLES DRAWN AEROBIC AND ANAEROBIC Blood Culture adequate volume   Culture   Final    NO GROWTH 3 DAYS Performed at [facility], [address], [place], [place] [address]    Report Status PENDING  Incomplete  MRSA PCR Screening     Status: None   Collection Time: 05/12/18 11:58 AM  Result Value Ref Range Status     MRSA by PCR NEGATIVE NEGATIVE Final    Comment:        The GeneXpert MRSA Assay (FDA approved for NASAL specimens only), is one component of a comprehensive MRSA colonization surveillance program. It is not intended to diagnose MRSA infection nor to guide or monitor treatment for MRSA infections. Performed at [facility], [address], [place], [place] [address]   Culture, respiratory     Status: None (Preliminary result)   Collection Time: 05/14/18  4:00 AM  Result Value Ref Range Status   Specimen Description   Final    SPUTUM Performed at [facility], [address], [place], [place] [address]    Special Requests   Final    NONE Performed at [facility], [address] [place], [place] [address]    Gram Stain   Final    MODERATE WBC PRESENT, PREDOMINANTLY PMN RARE GRAM POSITIVE COCCI Performed at [facility], [place] [address], [place], [place] [address]    Culture PENDING  Incomplete   Report Status PENDING  Incomplete     Scheduled Meds: . aspirin EC  81 mg Oral Daily  . budesonide (PULMICORT) nebulizer solution  0.5 mg Nebulization BID  . carvedilol  50 mg Oral BID WC  . cefdinir  300 mg Oral Q12H  . Chlorhexidine Gluconate Cloth  6 each Topical Q0600  . citalopram  20 mg Oral Daily  . enoxaparin (LOVENOX) injection  40 mg Subcutaneous Q24H  . fluticasone  2 spray Each Nare Daily  . gabapentin  600 mg Oral TID  . guaiFENesin  1,200 mg Oral BID  . hydrALAZINE  100 mg Oral TID  . insulin aspart  0-15 Units Subcutaneous TID WC  . insulin aspart  4 Units Subcutaneous TID WC  . insulin glargine  30 Units Subcutaneous QHS  . ipratropium-albuterol  3 mL Nebulization Q6H  . Living Better with Heart Failure Book   Does not apply Once  . loratadine  10 mg Oral Daily  . methylPREDNISolone (SOLU-MEDROL) injection  60 mg Intravenous Q12H  . mupirocin ointment  1 application Nasal BID  . oseltamivir  75 mg Oral BID  . pantoprazole  40 mg Oral Daily  .  pyridostigmine  60 mg Oral Q8H  . sacubitril-valsartan  1 tablet Oral BID  . sodium chloride flush  3 mL Intravenous Q12H  .  spironolactone  50 mg Oral Daily  . traZODone  100 mg Oral QHS   Continuous Infusions: . sodium chloride 10 mL/hr at 05/14/18 0451    Procedures/Studies: Dg Chest 2 View  Result Date: 05/12/2018 CLINICAL DATA:  Acute onset of cough. EXAM: CHEST - 2 VIEW COMPARISON:  Chest radiograph performed 05/03/2018 FINDINGS: The lungs are well-aerated. Vascular congestion is noted. Mild bibasilar opacities may reflect mild interstitial edema. There is no evidence of pleural effusion or pneumothorax. The heart is mildly enlarged. No acute osseous abnormalities are seen. IMPRESSION: Vascular congestion and mild cardiomegaly. Mild bibasilar opacities may reflect mild interstitial edema. Electronically Signed   By: Garald Balding M.D.   On: 05/12/2018 04:57   Dg Chest 2 View  Result Date: 05/03/2018 CLINICAL DATA:  Edema, productive cough EXAM: CHEST - 2 VIEW COMPARISON:  05/02/2018 FINDINGS: Cardiomegaly. No confluent airspace opacities, effusions or edema. No acute bony abnormality. IMPRESSION: Cardiomegaly.  No active disease. Electronically Signed   By: Rolm Baptise M.D.   On: 05/03/2018 09:44   Dg Chest 2 View  Result Date: 05/02/2018 CLINICAL DATA:  Cough and congestion. EXAM: CHEST - 2 VIEW COMPARISON:  04/30/2018. FINDINGS: Mediastinum and hilar structures normal. Stable cardiomegaly. Mild peribronchial cuffing. Mild right base subsegmental atelectasis. No pleural effusion or pneumothorax. Degenerative changes scoliosis thoracic spine. IMPRESSION: Mild peribronchial cuffing. Bronchitis could not be excluded. Mild right base subsegmental atelectasis. 2.  Stable cardiomegaly.  No pulmonary venous congestion. Electronically Signed   By: Marcello Moores  Register   On: 05/02/2018 08:55   Dg Chest 2 View  Result Date: 04/30/2018 CLINICAL DATA:  Weakness, cough.  History of CHF. EXAM:  CHEST - 2 VIEW COMPARISON:  Chest radiograph Sep 29, 2017 FINDINGS: Cardiac silhouette is moderately enlarged and unchanged. Mediastinal silhouette is unremarkable. Mild pulmonary vascular congestion without pleural effusion or focal consolidation. No pneumothorax. Large body habitus. Surgical clips in the included right abdomen compatible with cholecystectomy. Mild degenerative changes thoracic spine. IMPRESSION: 1. Stable cardiomegaly and mild pulmonary vascular congestion. Electronically Signed   By: Elon Alas M.D.   On: 04/30/2018 04:46    Orson Eva, DO  [facility] Pager [phone]  If 7PM-7AM, please contact night-coverage [email or web address] Password [email or web address] 05/15/2018, 8:51 AM   LOS: 3 days

## 2018-05-15 NOTE — Care Management Note (Signed)
Case Management Note  Patient Details  Name: Chelsey Santiago MRN: [id number] Date of Birth: Dec 05, 1975  Subjective/Objective:  Dyspnea. Recent admission for same, at that timefelt to be related to CHF exacerbation along with upper respiratory infection. Flu negative at that time.  Flu B + this admission. From home, independent. Has PCP, cardiologist and Medicaid.  Not on the [facility] registry.  Not eligible for CHF program due to BMI of 37.04.   Action/Plan: Plans to follow up with PCP and cardiologist post DC.   Expected Discharge Date:     05/16/2018             Expected Discharge Plan:  Home/Self Care  In-House Referral:     Discharge planning Services  CM Consult  Post Acute Care Choice:  NA Choice offered to:  NA  DME Arranged:    DME Agency:     HH Arranged:    HH Agency:     Status of Service:  Completed, signed off  If discussed at [employer] of Stay Meetings, dates discussed:    Additional Comments:  Devone Tousley, [name], RN 05/15/2018, 1:55 PM

## 2018-05-16 LAB — GLUCOSE, CAPILLARY
Glucose-Capillary: 317 mg/dL — ABNORMAL HIGH (ref 70–99)
Glucose-Capillary: 370 mg/dL — ABNORMAL HIGH (ref 70–99)

## 2018-05-16 LAB — BASIC METABOLIC PANEL
Anion gap: 9 (ref 5–15)
BUN: 40 mg/dL — ABNORMAL HIGH (ref 6–20)
CO2: 25 mmol/L (ref 22–32)
Calcium: 9.2 mg/dL (ref 8.9–10.3)
Chloride: 100 mmol/L (ref 98–111)
Creatinine, Ser: 1.12 mg/dL — ABNORMAL HIGH (ref 0.44–1.00)
GFR calc Af Amer: >60 mL/min (ref >60)
Glucose, Bld: 337 mg/dL — ABNORMAL HIGH (ref 70–99)
POTASSIUM: 5 mmol/L (ref 3.5–5.1)
Sodium: 134 mmol/L — ABNORMAL LOW (ref 135–145)

## 2018-05-16 LAB — CULTURE, RESPIRATORY: CULTURE: NORMAL

## 2018-05-16 LAB — CULTURE, RESPIRATORY W GRAM STAIN

## 2018-05-16 LAB — MAGNESIUM: Magnesium: 2 mg/dL (ref 1.7–2.4)

## 2018-05-16 MED ORDER — OSELTAMIVIR PHOSPHATE 75 MG PO CAPS: 75.0000 mg | ORAL_CAPSULE | Freq: Two times a day (BID) | ORAL | 0 refills | Status: DC

## 2018-05-16 MED ORDER — PREDNISONE 20 MG PO TABS: 60.0000 mg | ORAL_TABLET | Freq: Every day | ORAL | Status: DC

## 2018-05-16 MED ORDER — HYDROCODONE-HOMATROPINE 5-1.5 MG/5ML PO SYRP: 5.0000 mL | ORAL_SOLUTION | Freq: Four times a day (QID) | ORAL | 0 refills | Status: DC | PRN

## 2018-05-16 MED ORDER — PREDNISONE 10 MG PO TABS: 60.0000 mg | ORAL_TABLET | Freq: Every day | ORAL | 0 refills | Status: DC

## 2018-05-16 NOTE — Progress Notes (Signed)
Discharge instructions reviewed with patient, all questions answered and patient denies further questions at this time. Iv and telemetry monitor removed. Patient gathered all belongings and NT escorted patient with wheelchair.

## 2018-05-16 NOTE — Discharge Summary (Signed)
Physician Discharge Summary  Chelsey Santiago ZJQ:[id number] DOB: 02/09/1976 DOA: 05/12/2018  PCP: Rosita Fire, MD  Admit date: 05/12/2018 Discharge date: 05/16/2018  Admitted From: Home Disposition:  Home   Recommendations for Outpatient Follow-up:  1. Follow up with PCP in 1-2 weeks 2. Please obtain BMP/CBC in one week     Discharge Condition: Stable CODE STATUS: FULL Diet recommendation: Heart Healthy   Brief/Interim Summary: Patient is a 42 year old female history of insulin-dependent diabetes mellitus, hypertension, obesity, history of cardiomyopathy, combined systolic and diastolic heart failure last EF 45 to 50% with diffuse hypokinesis, grade 2 diastolic dysfunction, hypertension, depression, obstructive sleep apnea presented to the ED with ongoing cough shortness of breath and noted to be hypertensive urgency. Patient recently hospitalized April 30, 2018 to May 04, 2018 for shortness of breath felt to be multifactorial secondary to CHF exacerbation, upper respiratory infection and probable bronchitis and discharged home on azithromycin and steroid taper. Patient admitted for worsening shortness of breath. Noted to be positive for influenza B. Patient started on Tamiflu.  Discharge Diagnoses:  acute on chronic combined systolic and diastolic heart failure,  -multifactorial secondary to influenza B, acute on chronic combined systolic and diastolic heart failure, presumed pneumonia, upper respiratory infection.  -stated had a temperature of 101 at home and in the ED noted to have a temperature of 100.4.  -Patient also noted to be hypertensive urgency on admission. -BNP elevated at 1812.0.  -Troponin was negative x3.  -Chest x-ray and exam consistent with volume overload. -Admitted to the stepdown unit.  -Sputum Gram stain and cultures pendingwith no growth to date.  -2D echo done 04/30/2018 with a EF of 45 to 50%, diffuse hypokinesis, grade 2 diastolic  dysfunction.  -Lasix 60 mg IV every 12 hours>>>torsemide 12/30 -Strict I's and O's.  -spironolactone,Coreg,hydralazine, Entresto -Discontinue potassium supplementation as potassium is trending up -Discharge weight 231 pounds -NEG 4 pounds for the admission  Influenza B with respiratory manifestations/HCAP -Influenza PCR was positive for influenza B.   -started on Tamiflu.Continue  =Continue Pulmicort, Mucinex, scheduled duo nebs, Claritin, Flonase, PPI, Pulmicort, . -Discontinue IV cefepime and placed on oral Vantin to complete a 5 day course of antibiotic treatment.  -Placed on Solu-Medrol 60 mg IV every 12 hours>>> discharge home with prednisone taper -Discharge home with another 24 hours of oseltamavir to complete 5 days of therapy -Ambulatory pulse oximetry did not show oxygen desaturation less than 88% on RA  Hypertensive urgency -Improved since admission.  -Continue home regimen of Entresto, spironolactone, Coregand hydralazine. -BP improved with reinstitution of home regimen  Diabetes mellitus type 2, uncontrolled with hyperglycemia -Hemoglobin A1c 8.5.  -Continue home regimen Lantus 30 units daily, and sliding scale insulin and Neurontin.  -Changed to resistant scale -NovoLog 4 units with meals -Anticipate improvement of CBGs with weaning prednisone  Iron deficiency anemia -Baseline hemoglobin ~10  Hypokalemia/hypomagnesemia Repleted.  -Magnesium currently at 1.9 from 1.4 on admission. Follow.   Depression/anxiety Continue Celexa.  Obesity -BMI 37.04  Nonischemic cardiomyopathy Resume home regimen of Coreg. Continuehome regimen of Entresto, spironolactone, hydralazine.   Discharge Instructions   Allergies as of 05/16/2018      Reactions   Diclofenac Swelling   AND POSSIBLE SYNCOPE; tolerates ibuprofen per pt   Tramadol Nausea And Vomiting, Nausea Only   Itching (12/21); tolerates ibuprofen per pt   Vicodin  [hydrocodone-acetaminophen] Itching, Nausea Only   Hydrocodone Bitartrate Itching      Medication List    STOP taking these medications   benzonatate  200 MG capsule Commonly known as:  TESSALON   naproxen 500 MG tablet Commonly known as:  NAPROSYN     TAKE these medications   albuterol 108 (90 Base) MCG/ACT inhaler Commonly known as:  PROVENTIL HFA;VENTOLIN HFA Inhale 1 puff into the lungs every 6 (six) hours as needed for wheezing or shortness of breath.   aspirin EC 81 MG tablet Take 81 mg by mouth daily.   budesonide-formoterol 80-4.5 MCG/ACT inhaler Commonly known as:  SYMBICORT Inhale 2 puffs into the lungs 2 (two) times daily.   carvedilol 25 MG tablet Commonly known as:  COREG Take 2 tablets (50 mg total) by mouth 2 (two) times daily with a meal.   CELEXA 20 MG tablet Generic drug:  citalopram Take 20 mg by mouth daily.   freestyle lancets Use as instructed   gabapentin 600 MG tablet Commonly known as:  NEURONTIN Take 600 mg by mouth 3 (three) times daily.   guaiFENesin-dextromethorphan 100-10 MG/5ML syrup Commonly known as:  ROBITUSSIN DM Take 10 mLs by mouth every 4 (four) hours as needed for cough.   hydrALAZINE 100 MG tablet Commonly known as:  APRESOLINE Take 1 tablet (100 mg total) by mouth 3 (three) times daily.   HYDROcodone-homatropine 5-1.5 MG/5ML syrup Commonly known as:  HYCODAN Take 5 mLs by mouth every 6 (six) hours as needed for cough.   insulin glargine 100 UNIT/ML injection Commonly known as:  LANTUS Inject 30 Units at bedtime into the skin.   insulin lispro 100 UNIT/ML injection Commonly known as:  HUMALOG Inject into the skin 3 (three) times daily before meals.   Ipratropium-Albuterol 20-100 MCG/ACT Aers respimat Commonly known as:  COMBIVENT RESPIMAT Inhale 1 puff into the lungs every 6 (six) hours as needed for wheezing or shortness of breath.   metFORMIN 500 MG tablet Commonly known as:  GLUCOPHAGE Take 500 mg 2 (two)  times daily with a meal by mouth.   nitroGLYCERIN 0.4 MG SL tablet Commonly known as:  NITROSTAT Place 1 tablet (0.4 mg total) under the tongue every 5 (five) minutes as needed for chest pain.   omeprazole 40 MG capsule Commonly known as:  PRILOSEC Take 40 mg by mouth daily.   ondansetron 4 MG tablet Commonly known as:  ZOFRAN Take 4 mg by mouth daily as needed for nausea or vomiting.   oseltamivir 75 MG capsule Commonly known as:  TAMIFLU Take 1 capsule (75 mg total) by mouth 2 (two) times daily.   oxyCODONE-acetaminophen 5-325 MG tablet Commonly known as:  PERCOCET/ROXICET Take 1 tablet by mouth every 4 (four) hours as needed for severe pain.   potassium chloride SA 20 MEQ tablet Commonly known as:  K-DUR,KLOR-CON Take 1 tablet (20 mEq total) by mouth daily.   predniSONE 10 MG tablet Commonly known as:  DELTASONE Take 6 tablets (60 mg total) by mouth daily with breakfast. And decrease by one tablet daily Start taking on:  May 17, 2018   pyridostigmine 60 MG tablet Commonly known as:  MESTINON Take 60 mg by mouth every 8 (eight) hours.   sacubitril-valsartan 97-103 MG Commonly known as:  ENTRESTO Take 1 tablet by mouth 2 (two) times daily.   spironolactone 50 MG tablet Commonly known as:  ALDACTONE Take 1 tablet (50 mg total) by mouth daily.   torsemide 20 MG tablet Commonly known as:  DEMADEX Please take 40 mg oral every morning and 20 mg every evening. What changed:    how much to take  how to  take this  when to take this   traZODone 100 MG tablet Commonly known as:  DESYREL Take 100 mg by mouth 3 (three) times daily after meals.       Allergies  Allergen Reactions  . Diclofenac Swelling    AND POSSIBLE SYNCOPE; tolerates ibuprofen per pt  . Tramadol Nausea And Vomiting and Nausea Only    Itching (12/21); tolerates ibuprofen per pt  . Vicodin [Hydrocodone-Acetaminophen] Itching and Nausea Only  . Hydrocodone Bitartrate Itching     Consultations:  none   Procedures/Studies: Dg Chest 2 View  Result Date: 05/12/2018 CLINICAL DATA:  Acute onset of cough. EXAM: CHEST - 2 VIEW COMPARISON:  Chest radiograph performed 05/03/2018 FINDINGS: The lungs are well-aerated. Vascular congestion is noted. Mild bibasilar opacities may reflect mild interstitial edema. There is no evidence of pleural effusion or pneumothorax. The heart is mildly enlarged. No acute osseous abnormalities are seen. IMPRESSION: Vascular congestion and mild cardiomegaly. Mild bibasilar opacities may reflect mild interstitial edema. Electronically Signed   By: Garald Balding M.D.   On: 05/12/2018 04:57   Dg Chest 2 View  Result Date: 05/03/2018 CLINICAL DATA:  Edema, productive cough EXAM: CHEST - 2 VIEW COMPARISON:  05/02/2018 FINDINGS: Cardiomegaly. No confluent airspace opacities, effusions or edema. No acute bony abnormality. IMPRESSION: Cardiomegaly.  No active disease. Electronically Signed   By: Rolm Baptise M.D.   On: 05/03/2018 09:44   Dg Chest 2 View  Result Date: 05/02/2018 CLINICAL DATA:  Cough and congestion. EXAM: CHEST - 2 VIEW COMPARISON:  04/30/2018. FINDINGS: Mediastinum and hilar structures normal. Stable cardiomegaly. Mild peribronchial cuffing. Mild right base subsegmental atelectasis. No pleural effusion or pneumothorax. Degenerative changes scoliosis thoracic spine. IMPRESSION: Mild peribronchial cuffing. Bronchitis could not be excluded. Mild right base subsegmental atelectasis. 2.  Stable cardiomegaly.  No pulmonary venous congestion. Electronically Signed   By: Marcello Moores  Register   On: 05/02/2018 08:55   Dg Chest 2 View  Result Date: 04/30/2018 CLINICAL DATA:  Weakness, cough.  History of CHF. EXAM: CHEST - 2 VIEW COMPARISON:  Chest radiograph Sep 29, 2017 FINDINGS: Cardiac silhouette is moderately enlarged and unchanged. Mediastinal silhouette is unremarkable. Mild pulmonary vascular congestion without pleural effusion or focal  consolidation. No pneumothorax. Large body habitus. Surgical clips in the included right abdomen compatible with cholecystectomy. Mild degenerative changes thoracic spine. IMPRESSION: 1. Stable cardiomegaly and mild pulmonary vascular congestion. Electronically Signed   By: Elon Alas M.D.   On: 04/30/2018 04:46        Discharge Exam: Vitals:   05/16/18 0838 05/16/18 0945  BP:  (!) 153/84  Pulse:  81  Resp:    Temp:    SpO2: 99%    Vitals:   05/16/18 0539 05/16/18 0836 05/16/18 0838 05/16/18 0945  BP: 138/75   (!) 153/84  Pulse: 73   81  Resp:      Temp: 98.4 F (36.9 C)     TempSrc: Oral     SpO2: 96% 98% 99%   Weight:      Height:        General: Pt is alert, awake, not in acute distress Cardiovascular: RRR, S1/S2 +, no rubs, no gallops Respiratory: bibasilar rales, minimal basilar wheeze Abdominal: Soft, NT, ND, bowel sounds + Extremities: no edema, no cyanosis   The results of significant diagnostics from this hospitalization (including imaging, microbiology, ancillary and laboratory) are listed below for reference.    Significant Diagnostic Studies: Dg Chest 2 View  Result Date: 05/12/2018  CLINICAL DATA:  Acute onset of cough. EXAM: CHEST - 2 VIEW COMPARISON:  Chest radiograph performed 05/03/2018 FINDINGS: The lungs are well-aerated. Vascular congestion is noted. Mild bibasilar opacities may reflect mild interstitial edema. There is no evidence of pleural effusion or pneumothorax. The heart is mildly enlarged. No acute osseous abnormalities are seen. IMPRESSION: Vascular congestion and mild cardiomegaly. Mild bibasilar opacities may reflect mild interstitial edema. Electronically Signed   By: Garald Balding M.D.   On: 05/12/2018 04:57   Dg Chest 2 View  Result Date: 05/03/2018 CLINICAL DATA:  Edema, productive cough EXAM: CHEST - 2 VIEW COMPARISON:  05/02/2018 FINDINGS: Cardiomegaly. No confluent airspace opacities, effusions or edema. No acute bony  abnormality. IMPRESSION: Cardiomegaly.  No active disease. Electronically Signed   By: Rolm Baptise M.D.   On: 05/03/2018 09:44   Dg Chest 2 View  Result Date: 05/02/2018 CLINICAL DATA:  Cough and congestion. EXAM: CHEST - 2 VIEW COMPARISON:  04/30/2018. FINDINGS: Mediastinum and hilar structures normal. Stable cardiomegaly. Mild peribronchial cuffing. Mild right base subsegmental atelectasis. No pleural effusion or pneumothorax. Degenerative changes scoliosis thoracic spine. IMPRESSION: Mild peribronchial cuffing. Bronchitis could not be excluded. Mild right base subsegmental atelectasis. 2.  Stable cardiomegaly.  No pulmonary venous congestion. Electronically Signed   By: Marcello Moores  Register   On: 05/02/2018 08:55   Dg Chest 2 View  Result Date: 04/30/2018 CLINICAL DATA:  Weakness, cough.  History of CHF. EXAM: CHEST - 2 VIEW COMPARISON:  Chest radiograph Sep 29, 2017 FINDINGS: Cardiac silhouette is moderately enlarged and unchanged. Mediastinal silhouette is unremarkable. Mild pulmonary vascular congestion without pleural effusion or focal consolidation. No pneumothorax. Large body habitus. Surgical clips in the included right abdomen compatible with cholecystectomy. Mild degenerative changes thoracic spine. IMPRESSION: 1. Stable cardiomegaly and mild pulmonary vascular congestion. Electronically Signed   By: Elon Alas M.D.   On: 04/30/2018 04:46     Microbiology: Recent Results (from the past 240 hour(s))  Culture, Urine     Status: Abnormal   Collection Time: 05/12/18  7:48 AM  Result Value Ref Range Status   Specimen Description URINE, RANDOM  Final   Special Requests   Final    NONE Performed at [facility], [address] [place], [place] [address]    Culture <10,000 COLONIES/mL INSIGNIFICANT GROWTH (A)  Final   Report Status 05/13/2018 FINAL  Final  Culture, blood (Routine X 2) w Reflex to ID Panel     Status: None (Preliminary result)   Collection Time: 05/12/18  8:05 AM    Result Value Ref Range Status   Specimen Description BLOOD RIGHT ARM  Final   Special Requests   Final    BOTTLES DRAWN AEROBIC AND ANAEROBIC Blood Culture adequate volume   Culture   Final    NO GROWTH 4 DAYS Performed at [facility], [address] [place], [place] [address]    Report Status PENDING  Incomplete  Culture, blood (Routine X 2) w Reflex to ID Panel     Status: None (Preliminary result)   Collection Time: 05/12/18  8:10 AM  Result Value Ref Range Status   Specimen Description BLOOD LEFT ARM  Final   Special Requests   Final    BOTTLES DRAWN AEROBIC AND ANAEROBIC Blood Culture adequate volume   Culture   Final    NO GROWTH 4 DAYS Performed at [facility], [address], [place], [place] [address]    Report Status PENDING  Incomplete  MRSA PCR Screening  Status: None   Collection Time: 05/12/18 11:58 AM  Result Value Ref Range Status   MRSA by PCR NEGATIVE NEGATIVE Final    Comment:        The GeneXpert MRSA Assay (FDA approved for NASAL specimens only), is one component of a comprehensive MRSA colonization surveillance program. It is not intended to diagnose MRSA infection nor to guide or monitor treatment for MRSA infections. Performed at [facility], [address], [place], [place] [address]   Culture, respiratory     Status: None (Preliminary result)   Collection Time: 05/14/18  4:00 AM  Result Value Ref Range Status   Specimen Description   Final    SPUTUM Performed at [facility], [address] [place], [place] [address]    Special Requests   Final    NONE Performed at [facility], [address], [place], [place] [address]    Gram Stain   Final    MODERATE WBC PRESENT, PREDOMINANTLY PMN RARE GRAM POSITIVE COCCI Performed at [facility], [place] [address], [place],  [address]    Culture FEW Consistent with normal respiratory flora.  Final   Report Status PENDING  Incomplete     Labs: Basic Metabolic Panel: Recent Labs   Lab 05/12/18 0529 05/12/18 0805 05/13/18 0459 05/14/18 0431 05/15/18 0401 05/16/18 0428  NA 135  --  138 139 134* 134*  K 3.0*  --  3.1* 4.2 4.7 5.0  CL 99  --  96* 99 100 100  CO2 25  --  31 30 24 25   GLUCOSE 150*  --  183* 186* 325* 337*  BUN 23*  --  19 24* 33* 40*  CREATININE 1.05*  --  0.88 1.13* 1.30* 1.12*  CALCIUM 8.6*  --  8.1* 8.6* 9.2 9.2  MG  --  1.4* 1.9 1.9  --  2.0   Liver Function Tests: No results for input(s): AST, ALT, ALKPHOS, BILITOT, PROT, ALBUMIN in the last 168 hours. No results for input(s): LIPASE, AMYLASE in the last 168 hours. No results for input(s): AMMONIA in the last 168 hours. CBC: Recent Labs  Lab 05/12/18 0529 05/13/18 0459 05/14/18 0431 05/15/18 0401  WBC 9.0 7.5 8.7 9.7  NEUTROABS 7.0  --   --   --   HGB 8.8* 10.2* 11.1* 11.5*  HCT 28.9* 34.3* 37.2 39.0  MCV 71.9* 72.5* 72.8* 74.0*  PLT 247 227 214 265   Cardiac Enzymes: Recent Labs  Lab 05/12/18 0529 05/12/18 1127 05/12/18 1724 05/12/18 2319  TROPONINI <0.03 <0.03 <0.03 <0.03   BNP: Invalid input(s): POCBNP CBG: Recent Labs  Lab 05/15/18 1114 05/15/18 1628 05/15/18 2019 05/15/18 2115 05/16/18 0753  GLUCAP 446* 364* 294* 272* 317*    Time coordinating discharge:  36 minutes  Signed:  Orson Eva, DO [facility] Pager: [phone] 05/16/2018, 11:40 AM

## 2018-05-16 NOTE — Progress Notes (Addendum)
Inpatient Diabetes Program Recommendations  AACE/ADA: New Consensus Statement on Inpatient Glycemic Control (2015)  Target Ranges:  Prepandial:   less than 140 mg/dL      Peak postprandial:   less than 180 mg/dL (1-2 hours)      Critically ill patients:  140 - 180 mg/dL   Lab Results  Component Value Date   GLUCAP 317 (H) 05/16/2018   HGBA1C 8.5 (H) 05/12/2018    Review of Glycemic Control Results for Chelsey Santiago, Chelsey Santiago (MRN [id number]) as of 05/16/2018 08:46  Ref. Range 05/15/2018 11:14 05/15/2018 16:28 05/15/2018 20:19 05/15/2018 21:15 05/16/2018 07:53  Glucose-Capillary Latest Ref Range: 70 - 99 mg/dL 446 (H) 364 (H) 294 (H) 272 (H) 317 (H)   Home DM Meds: Lantus 30 units QHS                             Humalog TID per SSI                             Metformin 500 mg BID  Current Orders:Novolog Resistant Correction Scale/ SSI (0-20 units) TID AC                           Lantus 30 units QHS + Novolog 4 units tid meal coverage if eats 50%  Inpatient Diabetes Program Recommendations: While on steroids:   -Increase Novolog meal coverage to 8 units tid if eats 50%  Thank you, Chelsey Santiago. Chelsey Kennard, RN, MSN, CDE  Diabetes Coordinator Inpatient Glycemic Control Team Team Pager [phone] (8am-5pm) 05/16/2018 8:48 AM

## 2018-05-17 LAB — CULTURE, BLOOD (ROUTINE X 2)
Culture: NO GROWTH
Culture: NO GROWTH
Special Requests: ADEQUATE
Special Requests: ADEQUATE

## 2018-06-02 ENCOUNTER — Encounter (HOSPITAL_COMMUNITY): Payer: Self-pay

## 2018-06-02 ENCOUNTER — Other Ambulatory Visit: Payer: Self-pay

## 2018-06-02 ENCOUNTER — Observation Stay (HOSPITAL_COMMUNITY)
Admission: EM | Admit: 2018-06-02 | Discharge: 2018-06-03 | Disposition: A | Discharge status: Home / Self Care | Payer: Medicaid Other | Attending: Internal Medicine | Admitting: Internal Medicine

## 2018-06-02 ENCOUNTER — Emergency Department (HOSPITAL_COMMUNITY): Payer: Medicaid Other

## 2018-06-02 ENCOUNTER — Observation Stay (HOSPITAL_COMMUNITY): Payer: Medicaid Other

## 2018-06-02 DIAGNOSIS — E669 Obesity, unspecified: Secondary | ICD-10-CM | POA: Insufficient documentation

## 2018-06-02 DIAGNOSIS — F329 Major depressive disorder, single episode, unspecified: Secondary | ICD-10-CM | POA: Insufficient documentation

## 2018-06-02 DIAGNOSIS — D649 Anemia, unspecified: Secondary | ICD-10-CM | POA: Insufficient documentation

## 2018-06-02 DIAGNOSIS — I10 Essential (primary) hypertension: Secondary | ICD-10-CM | POA: Diagnosis not present

## 2018-06-02 DIAGNOSIS — E119 Type 2 diabetes mellitus without complications: Secondary | ICD-10-CM | POA: Diagnosis not present

## 2018-06-02 DIAGNOSIS — R0609 Other forms of dyspnea: Secondary | ICD-10-CM

## 2018-06-02 DIAGNOSIS — I110 Hypertensive heart disease with heart failure: Secondary | ICD-10-CM | POA: Insufficient documentation

## 2018-06-02 DIAGNOSIS — R072 Precordial pain: Secondary | ICD-10-CM | POA: Diagnosis not present

## 2018-06-02 DIAGNOSIS — R079 Chest pain, unspecified: Secondary | ICD-10-CM

## 2018-06-02 DIAGNOSIS — E1165 Type 2 diabetes mellitus with hyperglycemia: Secondary | ICD-10-CM | POA: Insufficient documentation

## 2018-06-02 DIAGNOSIS — I428 Other cardiomyopathies: Secondary | ICD-10-CM

## 2018-06-02 DIAGNOSIS — R51 Headache: Secondary | ICD-10-CM | POA: Diagnosis not present

## 2018-06-02 DIAGNOSIS — Z79899 Other long term (current) drug therapy: Secondary | ICD-10-CM | POA: Insufficient documentation

## 2018-06-02 DIAGNOSIS — R0789 Other chest pain: Secondary | ICD-10-CM | POA: Diagnosis not present

## 2018-06-02 DIAGNOSIS — E1142 Type 2 diabetes mellitus with diabetic polyneuropathy: Secondary | ICD-10-CM | POA: Insufficient documentation

## 2018-06-02 DIAGNOSIS — Z7982 Long term (current) use of aspirin: Secondary | ICD-10-CM | POA: Insufficient documentation

## 2018-06-02 DIAGNOSIS — I5042 Chronic combined systolic (congestive) and diastolic (congestive) heart failure: Secondary | ICD-10-CM

## 2018-06-02 DIAGNOSIS — E785 Hyperlipidemia, unspecified: Secondary | ICD-10-CM | POA: Diagnosis not present

## 2018-06-02 DIAGNOSIS — R519 Headache, unspecified: Secondary | ICD-10-CM

## 2018-06-02 LAB — COMPREHENSIVE METABOLIC PANEL
ALT: 20 U/L (ref 0–44)
AST: 20 U/L (ref 15–41)
Albumin: 3.3 g/dL — ABNORMAL LOW (ref 3.5–5.0)
Alkaline Phosphatase: 65 U/L (ref 38–126)
Anion gap: 10 (ref 5–15)
BUN: 17 mg/dL (ref 6–20)
CHLORIDE: 99 mmol/L (ref 98–111)
CO2: 25 mmol/L (ref 22–32)
CREATININE: 0.83 mg/dL (ref 0.44–1.00)
Calcium: 8.9 mg/dL (ref 8.9–10.3)
GFR calc Af Amer: >60 mL/min (ref >60)
GFR calc non Af Amer: >60 mL/min (ref >60)
Glucose, Bld: 317 mg/dL — ABNORMAL HIGH (ref 70–99)
POTASSIUM: 4 mmol/L (ref 3.5–5.1)
Sodium: 134 mmol/L — ABNORMAL LOW (ref 135–145)
Total Bilirubin: 0.4 mg/dL (ref 0.3–1.2)
Total Protein: 6.6 g/dL (ref 6.5–8.1)

## 2018-06-02 LAB — I-STAT TROPONIN, ED
Troponin i, poc: 0.04 ng/mL (ref 0.00–0.08)
Troponin i, poc: 0.03 ng/mL (ref 0.00–0.08)

## 2018-06-02 LAB — CBC
HEMATOCRIT: 30.9 % — AB (ref 36.0–46.0)
Hemoglobin: 9.1 g/dL — ABNORMAL LOW (ref 12.0–15.0)
MCH: 21.5 pg — ABNORMAL LOW (ref 26.0–34.0)
MCHC: 29.4 g/dL — ABNORMAL LOW (ref 30.0–36.0)
MCV: 72.9 fL — ABNORMAL LOW (ref 80.0–100.0)
NRBC: 0 % (ref 0.0–0.2)
Platelets: 192 10*3/uL (ref 150–400)
RBC: 4.24 MIL/uL (ref 3.87–5.11)
RDW: 16.2 % — ABNORMAL HIGH (ref 11.5–15.5)
WBC: 8.3 10*3/uL (ref 4.0–10.5)

## 2018-06-02 LAB — TROPONIN I
TROPONIN I: 0.03 ng/mL — AB (ref <0.03)
Troponin I: 0.03 ng/mL (ref <0.03)
Troponin I: 0.03 ng/mL (ref <0.03)
Troponin I: 0.03 ng/mL (ref <0.03)
Troponin I: 0.03 ng/mL (ref <0.03)

## 2018-06-02 LAB — BRAIN NATRIURETIC PEPTIDE: B Natriuretic Peptide: 660 pg/mL — ABNORMAL HIGH (ref 0.0–100.0)

## 2018-06-02 LAB — I-STAT BETA HCG BLOOD, ED (MC, WL, AP ONLY)

## 2018-06-02 LAB — GLUCOSE, CAPILLARY
Glucose-Capillary: 330 mg/dL — ABNORMAL HIGH (ref 70–99)
Glucose-Capillary: 376 mg/dL — ABNORMAL HIGH (ref 70–99)
Glucose-Capillary: 209 mg/dL — ABNORMAL HIGH (ref 70–99)
Glucose-Capillary: 242 mg/dL — ABNORMAL HIGH (ref 70–99)

## 2018-06-02 LAB — MAGNESIUM: MAGNESIUM: 1.9 mg/dL (ref 1.7–2.4)

## 2018-06-02 LAB — CBG MONITORING, ED: Glucose-Capillary: 349 mg/dL — ABNORMAL HIGH (ref 70–99)

## 2018-06-02 MED ORDER — INSULIN ASPART 100 UNIT/ML ~~LOC~~ SOLN: 0.0000 [IU] | Freq: Three times a day (TID) | SUBCUTANEOUS | Status: DC

## 2018-06-02 MED ORDER — ACETAMINOPHEN 325 MG PO TABS
650.0000 mg | ORAL_TABLET | Freq: Four times a day (QID) | ORAL | Status: DC | PRN
  Administered 2018-06-02: 650 mg via ORAL

## 2018-06-02 MED ORDER — TORSEMIDE 20 MG PO TABS
40.0000 mg | ORAL_TABLET | Freq: Every day | ORAL | Status: DC
  Administered 2018-06-02 – 2018-06-03 (×2): 40 mg via ORAL
  Filled 2018-06-02: qty 2

## 2018-06-02 MED ORDER — HYDRALAZINE HCL 25 MG PO TABS
100.0000 mg | ORAL_TABLET | Freq: Three times a day (TID) | ORAL | Status: DC
  Administered 2018-06-02 – 2018-06-03 (×3): 100 mg via ORAL
  Filled 2018-06-02 (×4): qty 4

## 2018-06-02 MED ORDER — ACETAMINOPHEN 650 MG RE SUPP: 650.0000 mg | Freq: Four times a day (QID) | RECTAL | Status: DC | PRN

## 2018-06-02 MED ORDER — INSULIN ASPART 100 UNIT/ML ~~LOC~~ SOLN
0.0000 [IU] | Freq: Three times a day (TID) | SUBCUTANEOUS | Status: DC
  Administered 2018-06-02: 7 [IU] via SUBCUTANEOUS
  Administered 2018-06-02: 20 [IU] via SUBCUTANEOUS
  Administered 2018-06-03: 11 [IU] via SUBCUTANEOUS
  Administered 2018-06-03: 15 [IU] via SUBCUTANEOUS

## 2018-06-02 MED ORDER — POTASSIUM CHLORIDE CRYS ER 20 MEQ PO TBCR
20.0000 meq | EXTENDED_RELEASE_TABLET | Freq: Every day | ORAL | Status: DC
  Administered 2018-06-02 – 2018-06-03 (×2): 20 meq via ORAL
  Filled 2018-06-02 (×2): qty 1

## 2018-06-02 MED ORDER — SACUBITRIL-VALSARTAN 97-103 MG PO TABS
1.0000 | ORAL_TABLET | Freq: Two times a day (BID) | ORAL | Status: DC
  Administered 2018-06-02 – 2018-06-03 (×3): 1 via ORAL
  Filled 2018-06-02 (×7): qty 1

## 2018-06-02 MED ORDER — CITALOPRAM HYDROBROMIDE 20 MG PO TABS
20.0000 mg | ORAL_TABLET | Freq: Every day | ORAL | Status: DC
  Administered 2018-06-02 – 2018-06-03 (×2): 20 mg via ORAL
  Filled 2018-06-02 (×2): qty 1

## 2018-06-02 MED ORDER — ONDANSETRON HCL 4 MG PO TABS: 4.0000 mg | ORAL_TABLET | Freq: Four times a day (QID) | ORAL | Status: DC | PRN

## 2018-06-02 MED ORDER — MOMETASONE FURO-FORMOTEROL FUM 100-5 MCG/ACT IN AERO
2.0000 | INHALATION_SPRAY | Freq: Two times a day (BID) | RESPIRATORY_TRACT | Status: DC
  Administered 2018-06-02 – 2018-06-03 (×3): 2 via RESPIRATORY_TRACT
  Filled 2018-06-02: qty 8.8

## 2018-06-02 MED ORDER — ASPIRIN EC 81 MG PO TBEC
81.0000 mg | DELAYED_RELEASE_TABLET | Freq: Every day | ORAL | Status: DC
  Administered 2018-06-02 – 2018-06-03 (×2): 81 mg via ORAL
  Filled 2018-06-02 (×2): qty 1

## 2018-06-02 MED ORDER — SPIRONOLACTONE 25 MG PO TABS
50.0000 mg | ORAL_TABLET | Freq: Every day | ORAL | Status: DC
  Administered 2018-06-02 – 2018-06-03 (×2): 50 mg via ORAL
  Filled 2018-06-02 (×2): qty 2

## 2018-06-02 MED ORDER — GABAPENTIN 300 MG PO CAPS
600.0000 mg | ORAL_CAPSULE | Freq: Three times a day (TID) | ORAL | Status: DC
  Administered 2018-06-02 – 2018-06-03 (×4): 600 mg via ORAL
  Filled 2018-06-02 (×4): qty 2

## 2018-06-02 MED ORDER — TORSEMIDE 20 MG PO TABS
20.0000 mg | ORAL_TABLET | Freq: Every evening | ORAL | Status: DC
  Administered 2018-06-02: 20 mg via ORAL
  Filled 2018-06-02 (×2): qty 1

## 2018-06-02 MED ORDER — ENOXAPARIN SODIUM 40 MG/0.4ML ~~LOC~~ SOLN: 40.0000 mg | SUBCUTANEOUS | Status: DC

## 2018-06-02 MED ORDER — PYRIDOSTIGMINE BROMIDE 60 MG PO TABS
60.0000 mg | ORAL_TABLET | Freq: Three times a day (TID) | ORAL | Status: DC
  Administered 2018-06-02 – 2018-06-03 (×3): 60 mg via ORAL
  Filled 2018-06-02 (×10): qty 1

## 2018-06-02 MED ORDER — INSULIN GLARGINE 100 UNIT/ML ~~LOC~~ SOLN
30.0000 [IU] | Freq: Every day | SUBCUTANEOUS | Status: DC
  Administered 2018-06-02: 30 [IU] via SUBCUTANEOUS
  Filled 2018-06-02 (×3): qty 0.3

## 2018-06-02 MED ORDER — ENOXAPARIN SODIUM 60 MG/0.6ML ~~LOC~~ SOLN
0.5000 mg/kg | SUBCUTANEOUS | Status: DC
  Administered 2018-06-03: 50 mg via SUBCUTANEOUS
  Filled 2018-06-02 (×2): qty 0.6

## 2018-06-02 MED ORDER — LABETALOL HCL 5 MG/ML IV SOLN
10.0000 mg | Freq: Once | INTRAVENOUS | Status: DC
  Filled 2018-06-02: qty 4

## 2018-06-02 MED ORDER — ONDANSETRON HCL 4 MG/2ML IJ SOLN: 4.0000 mg | Freq: Four times a day (QID) | INTRAMUSCULAR | Status: DC | PRN

## 2018-06-02 MED ORDER — NITROGLYCERIN 2 % TD OINT
1.0000 [in_us] | TOPICAL_OINTMENT | Freq: Once | TRANSDERMAL | Status: AC
  Administered 2018-06-02: 1 [in_us] via TOPICAL
  Filled 2018-06-02: qty 1

## 2018-06-02 MED ORDER — PROCHLORPERAZINE EDISYLATE 10 MG/2ML IJ SOLN
10.0000 mg | Freq: Once | INTRAMUSCULAR | Status: AC
  Administered 2018-06-02: 10 mg via INTRAVENOUS
  Filled 2018-06-02: qty 2

## 2018-06-02 MED ORDER — DIPHENHYDRAMINE HCL 50 MG/ML IJ SOLN
25.0000 mg | Freq: Once | INTRAMUSCULAR | Status: AC
  Administered 2018-06-02: 25 mg via INTRAVENOUS
  Filled 2018-06-02: qty 1

## 2018-06-02 MED ORDER — ACETAMINOPHEN 500 MG PO TABS
1000.0000 mg | ORAL_TABLET | Freq: Once | ORAL | Status: AC
  Administered 2018-06-02: 1000 mg via ORAL
  Filled 2018-06-02: qty 2

## 2018-06-02 MED ORDER — ACETAMINOPHEN 325 MG PO TABS
650.0000 mg | ORAL_TABLET | Freq: Four times a day (QID) | ORAL | Status: DC | PRN
  Filled 2018-06-02: qty 2

## 2018-06-02 MED ORDER — INSULIN ASPART 100 UNIT/ML ~~LOC~~ SOLN
0.0000 [IU] | Freq: Every day | SUBCUTANEOUS | Status: DC
  Administered 2018-06-02: 2 [IU] via SUBCUTANEOUS

## 2018-06-02 MED ORDER — CARVEDILOL 12.5 MG PO TABS
50.0000 mg | ORAL_TABLET | Freq: Two times a day (BID) | ORAL | Status: DC
  Administered 2018-06-02 – 2018-06-03 (×2): 50 mg via ORAL
  Filled 2018-06-02 (×2): qty 4

## 2018-06-02 MED ORDER — PANTOPRAZOLE SODIUM 40 MG PO TBEC
40.0000 mg | DELAYED_RELEASE_TABLET | Freq: Every day | ORAL | Status: DC
  Administered 2018-06-02 – 2018-06-03 (×2): 40 mg via ORAL
  Filled 2018-06-02 (×2): qty 1

## 2018-06-02 NOTE — H&P (Signed)
History and Physical  LEIGHANN AMADON JGG:[id number] DOB: [birth year] DOA: 06/02/2018   PCP: Rosita Fire, MD   Patient coming from: Home  Chief Complaint: chest pain  HPI:  Chelsey Santiago is a 43 y.o. female with medical history of diabetes mellitus type 2, hypertension, systolic and diastolic CHF, OSA, depression presenting with 4-day history of centralized chest discomfort.  The patient describes it as a burning sensation that is somewhat worse with exertion with associated shortness of breath.  The patient states that she developed chest discomfort or shortness of breath at the grocery store on 06/01/2018.  Her symptoms continued to increase in frequency over the past 24 hours.  In the early a.m. 06/02/2018, the patient woke up with chest discomfort.  As result, the patient presented for further evaluation.  She endorses compliance with all her medications including her diuretics and antihypertensive medications.  She denies any fevers, chills, nausea, vomiting, diarrhea, abdominal pain, dysuria, hematuria but there is been no coughing or hemoptysis.  The patient was given sublingual nitroglycerin by EMS which relieved the chest discomfort. Notably, the patient was recently discharged from the hospital after a stay from 05/12/2018 through 05/16/2018 during which she was treated for influenza as well as acute on chronic systolic and diastolic CHF.  The patient was discharged home on her usual torsemide regimen. In the emergency department, the patient was afebrile hemodynamically stable.  She was hypertensive with systolic blood pressures in the 546T and diastolics in the 035W.  BMP, LFTs, and CBC were essentially unremarkable with her hemoglobin near baseline.  Chest x-ray was negative.  The patient was admitted for further evaluation.  Assessment/Plan: Chest pain -She has typical and atypical components -12/22/2015 heart catheterization--no significant CAD -EKG shows ST-T wave depression  in II, III, aVF, V5-V6  -Cardiology consult -Cycle troponins  Chronic systolic and diastolic CHF -Likely secondary to uncontrolled hypertension -04/30/2018 echo EF 45-50%, grade 2 DD, diffuse HK, PASP 44, trivial TR -Appears to be clinically compensated -Continue carvedilol, Entresto, torsemide home dose -The patient endorses indiscretion with fluid intake  Diabetes mellitus type 2, uncontrolled with hyperglycemia -Holding metformin -Continue Lantus 30 units daily -NovoLog sliding scale -05/12/2018 hemoglobin A1c 8.5  Essential hypertension, poorly controlled -Continue carvedilol, hydralazine, spironolactone  Depression -Continue Celexa  Diabetic polyneuropathy -Continue gabapentin  Iron deficiency anemia -Check iron studies -Baseline hemoglobin~10  Obesity -BMI 37.12      Past Medical History:  Diagnosis Date  . Anemia    H&H of 10.6/33 and 07/2008 and 11.9/35 and 09/2010  . Anxiety   . CHF (congestive heart failure) ([place])    a. EF 40-45% by echo in 12/2015 with cath showing normal cors  . Depression with anxiety   . Diabetes mellitus without complication ([place])   . Enlarged heart   . Hypertension   . Hypertensive heart disease 2009   Pulmonary edema postpartum; mild to moderate mitral regurgitation when hospitalized for CHF in 2009; Echocardiogram in 12/2009-no MR and normal EF; normal CXR in 09/2010  . Migraine headache   . Miscarriage 03/19/2013  . Obesity 04/16/2009  . Osteoarthritis, knee 03/29/2011  . Preeclampsia   . Pregnant   . Pulmonary edema   . Sleep apnea   . Threatened abortion in early pregnancy 03/15/2013   Past Surgical History:  Procedure Laterality Date  . BREAST REDUCTION SURGERY  2002  . CARDIAC CATHETERIZATION N/A 12/22/2015   Procedure: Left Heart Cath and Coronary Angiography;  Surgeon: Peter M Martinique,  MD;  Location: [facility];  Service: Cardiovascular;  Laterality: N/A;  . CESAREAN SECTION N/A 04/09/2014   Procedure: CESAREAN  SECTION;  Surgeon: Mora Bellman, MD;  Location: [facility];  Service: Obstetrics;  Laterality: N/A;  . CHOLECYSTECTOMY     Social History:  reports that she has never smoked. She has never used smokeless tobacco. She reports current alcohol use. She reports that she does not use drugs.   Family History  Problem Relation Age of Onset  . Diabetes Mother 31  . Heart disease Mother   . Hyperlipidemia Paternal Grandfather   . Hypertension Paternal Grandfather   . Heart disease Father   . Hypertension Father   . Heart disease Maternal Grandmother 60  . ADD / ADHD Son   . Hypertension Maternal Uncle   . Heart attack Brother   . Sudden death Neg Hx   . Colon cancer Neg Hx   . Celiac disease Neg Hx   . Inflammatory bowel disease Neg Hx      Allergies  Allergen Reactions  . Diclofenac Swelling    AND POSSIBLE SYNCOPE; tolerates ibuprofen per pt  . Tramadol Nausea And Vomiting and Nausea Only    Itching (12/21); tolerates ibuprofen per pt  . Vicodin [Hydrocodone-Acetaminophen] Itching and Nausea Only  . Hydrocodone Bitartrate Er Itching     Prior to Admission medications   Medication Sig Start Date End Date Taking? Authorizing Provider  albuterol (PROVENTIL HFA;VENTOLIN HFA) 108 (90 Base) MCG/ACT inhaler Inhale 1 puff into the lungs every 6 (six) hours as needed for wheezing or shortness of breath.    [provider]  aspirin EC 81 MG tablet Take 81 mg by mouth daily.    [provider]  budesonide-formoterol (SYMBICORT) 80-4.5 MCG/ACT inhaler Inhale 2 puffs into the lungs 2 (two) times daily. 05/04/18   Manuella Ghazi, Pratik D, DO  carvedilol (COREG) 25 MG tablet Take 2 tablets (50 mg total) by mouth 2 (two) times daily with a meal. 12/06/17   Strader, [place] M, PA-C  citalopram (CELEXA) 20 MG tablet Take 20 mg by mouth daily. 08/19/17   [provider]  gabapentin (NEURONTIN) 600 MG tablet Take 600 mg by mouth 3 (three) times daily.     [provider]    guaiFENesin-dextromethorphan (ROBITUSSIN DM) 100-10 MG/5ML syrup Take 10 mLs by mouth every 4 (four) hours as needed for cough. 05/04/18   Manuella Ghazi, Pratik D, DO  hydrALAZINE (APRESOLINE) 100 MG tablet Take 1 tablet (100 mg total) by mouth 3 (three) times daily. 07/12/17   Arnoldo Lenis, MD  HYDROcodone-homatropine [facility]) 5-1.5 MG/5ML syrup Take 5 mLs by mouth every 6 (six) hours as needed for cough. 05/16/18   Orson Eva, MD  insulin glargine (LANTUS) 100 UNIT/ML injection Inject 30 Units at bedtime into the skin.     [provider]  insulin lispro (HUMALOG) 100 UNIT/ML injection Inject into the skin 3 (three) times daily before meals.    [provider]  Ipratropium-Albuterol (COMBIVENT RESPIMAT) 20-100 MCG/ACT AERS respimat Inhale 1 puff into the lungs every 6 (six) hours as needed for wheezing or shortness of breath. 05/04/18   Manuella Ghazi, Pratik D, DO  Lancets (FREESTYLE) lancets Use as instructed 04/09/16   Arrien, Jimmy Picket, MD  metFORMIN (GLUCOPHAGE) 500 MG tablet Take 500 mg 2 (two) times daily with a meal by mouth.    [provider]  nitroGLYCERIN (NITROSTAT) 0.4 MG SL tablet Place 1 tablet (0.4 mg total) under the  tongue every 5 (five) minutes as needed for chest pain. 09/05/15   Kathie Dike, MD  omeprazole (PRILOSEC) 40 MG capsule Take 40 mg by mouth daily.    [provider]  ondansetron (ZOFRAN) 4 MG tablet Take 4 mg by mouth daily as needed for nausea or vomiting.    [provider]  oseltamivir (TAMIFLU) 75 MG capsule Take 1 capsule (75 mg total) by mouth 2 (two) times daily. 05/16/18   Orson Eva, MD  oxyCODONE-acetaminophen (PERCOCET/ROXICET) 5-325 MG tablet Take 1 tablet by mouth every 4 (four) hours as needed for severe pain.    [provider]  potassium chloride SA (K-DUR,KLOR-CON) 20 MEQ tablet Take 1 tablet (20 mEq total) by mouth daily. 05/12/17   Holley Bouche, NP  predniSONE (DELTASONE) 10 MG tablet Take 6  tablets (60 mg total) by mouth daily with breakfast. And decrease by one tablet daily 05/17/18   Lumi Winslett, Shanon Brow, MD  pyridostigmine (MESTINON) 60 MG tablet Take 60 mg by mouth every 8 (eight) hours.    [provider]  sacubitril-valsartan (ENTRESTO) 97-103 MG Take 1 tablet by mouth 2 (two) times daily. 04/09/16   Arrien, Jimmy Picket, MD  spironolactone (ALDACTONE) 50 MG tablet Take 1 tablet (50 mg total) by mouth daily. 09/30/17 05/12/18  Lorella Nimrod, MD  torsemide (DEMADEX) 20 MG tablet Please take 40 mg oral every morning and 20 mg every evening. Patient taking differently: Take 20-40 mg 2 (two) times daily by mouth. Please take 40 mg oral every morning and 20 mg every evening. 05/10/16   Elgergawy, Silver Huguenin, MD  traZODone (DESYREL) 100 MG tablet Take 100 mg by mouth 3 (three) times daily after meals. 08/19/17   [provider]    Review of Systems:  Constitutional:  No weight loss, night sweats, Fevers, chills, fatigue.  Head&Eyes: No headache.  No vision loss.  No eye pain or scotoma ENT:  No Difficulty swallowing,Tooth/dental problems,Sore throat,  No ear ache, post nasal drip,  Cardio-vascular:  No Orthopnea, PND, swelling in lower extremities,  dizziness, palpitations  GI:  No  abdominal pain, nausea, vomiting, diarrhea, loss of appetite, hematochezia, melena, heartburn, indigestion, Resp:  No shortness of breath with exertion or at rest. No cough. No coughing up of blood .No wheezing.No chest wall deformity  Skin:  no rash or lesions.  GU:  no dysuria, change in color of urine, no urgency or frequency. No flank pain.  Musculoskeletal:  No joint pain or swelling. No decreased range of motion. No back pain.  Psych:  No change in mood or affect. No depression or anxiety. Neurologic: No headache, no dysesthesia, no focal weakness, no vision loss. No syncope  Physical Exam: Vitals:   06/02/18 0543 06/02/18 0600 06/02/18 0630 06/02/18 0700  BP: (!) 166/99 (!)  171/103 (!) 169/103 (!) 159/107  Pulse: 75 93 77 74  Resp: 16 19 10 18   Temp:      TempSrc:      SpO2: 99% 98% 98% 97%  Weight:      Height:       General:  A&O x 3, NAD, nontoxic, pleasant/cooperative Head/Eye: No conjunctival hemorrhage, no icterus, [place]/AT, No nystagmus ENT:  No icterus,  No thrush, good dentition, no pharyngeal exudate Neck:  No masses, no lymphadenpathy, no bruits CV:  RRR, no rub, no gallop, no S3 Lung:  CTAB, good air movement, no wheeze, no rhonchi Abdomen: soft/NT, +BS, nondistended, no peritoneal signs Ext: No cyanosis, No rashes, No petechiae,  No lymphangitis, trace LE edema Neuro: CNII-XII intact, strength 4/5 in bilateral upper and lower extremities, no dysmetria  Labs on Admission:  Basic Metabolic Panel: Recent Labs  Lab 06/02/18 0405  NA 134*  K 4.0  CL 99  CO2 25  GLUCOSE 317*  BUN 17  CREATININE 0.83  CALCIUM 8.9  MG 1.9   Liver Function Tests: Recent Labs  Lab 06/02/18 0405  AST 20  ALT 20  ALKPHOS 65  BILITOT 0.4  PROT 6.6  ALBUMIN 3.3*   No results for input(s): LIPASE, AMYLASE in the last 168 hours. No results for input(s): AMMONIA in the last 168 hours. CBC: Recent Labs  Lab 06/02/18 0405  WBC 8.3  HGB 9.1*  HCT 30.9*  MCV 72.9*  PLT 192   Coagulation Profile: No results for input(s): INR, PROTIME in the last 168 hours. Cardiac Enzymes: No results for input(s): CKTOTAL, CKMB, CKMBINDEX, TROPONINI in the last 168 hours. BNP: Invalid input(s): POCBNP CBG: No results for input(s): GLUCAP in the last 168 hours. Urine analysis:    Component Value Date/Time   COLORURINE COLORLESS (A) 05/12/2018 0748   APPEARANCEUR CLEAR 05/12/2018 0748   LABSPEC 1.004 (L) 05/12/2018 0748   PHURINE 6.0 05/12/2018 0748   GLUCOSEU NEGATIVE 05/12/2018 0748   HGBUR NEGATIVE 05/12/2018 0748   BILIRUBINUR NEGATIVE 05/12/2018 0748   KETONESUR NEGATIVE 05/12/2018 0748   PROTEINUR NEGATIVE 05/12/2018 0748   UROBILINOGEN 0.2 04/29/2014  1115   NITRITE NEGATIVE 05/12/2018 0748   LEUKOCYTESUR NEGATIVE 05/12/2018 0748   Sepsis Labs: @LABRCNTIP (procalcitonin:4,lacticidven:4) )No results found for this or any previous visit (from the past 240 hour(s)).   Radiological Exams on Admission: Dg Chest 2 View  Result Date: 06/02/2018 CLINICAL DATA:  Chest pain EXAM: CHEST - 2 VIEW COMPARISON:  05/12/2018 FINDINGS: Chronic cardiomegaly. There is no edema, consolidation, effusion, or pneumothorax. Artifact from EKG leads. IMPRESSION: 1. No acute finding. 2. Chronic cardiomegaly Electronically Signed   By: Monte Fantasia M.D.   On: 06/02/2018 04:20    EKG: Independently reviewed. Sinus, STT depression II, III, aVF    Time spent:60 minutes Code Status:   FULL Family Communication:  No Family at bedside Disposition Plan: expect 1 day hospitalization Consults called: cardiology DVT Prophylaxis: [place] Lovenox  Orson Eva, DO  [facility] Pager [phone]  If 7PM-7AM, please contact night-coverage [email or web address] Password [facility] 06/02/2018, 7:41 AM

## 2018-06-02 NOTE — ED Provider Notes (Signed)
[facility] EMERGENCY DEPARTMENT Provider Note   CSN: [id number] Arrival date & time: 06/02/18  0302  Time seen 4 AM   History   Chief Complaint Chief Complaint  Patient presents with  . Chest Pain    HPI Chelsey Santiago is a 43 y.o. female.  HPI patient states she started getting chest pressure 4 days ago that would come and go and it was mainly related to exertion.  She also described it as a burning sensation.  She states it was in the center of her chest and also a little bit on the left side.  She did denies having a burning fluid in her throat.  She states when it would happen she would stop and rest and take a omeprazole.  She states her sister told her to stop drinking so much orange juice which she states she craves despite having diabetes.  She states on the 16th she went to the grocery store and got very short of breath when she was walking around the grocery store and had the chest pressure.  However when she sat in the car or got home and sat down she felt fine.  Last night at 10 PM she went to visit her son and when she walked from the car into his house she felt short of breath.  She states she used an inhaler and took another omeprazole and rested and it went away.  However at 2 AM this morning just prior to coming to the ED she was awakened from sleep with the same symptoms.  She states this is the first time it happened without exerting herself.  She states EMS gave her 3 sublingual nitroglycerin and 4 baby aspirin and her symptoms felt much better, she states she has a very mild discomfort now.  She states EMS told her her first blood pressure was 224/155.  She states it was similar to when she had her MI in 2017 although she states that was a stabbing pain.  She does admit to being stressed lately and her cardiologist told her when she gets stressed her blood pressure shoots up which makes her go into congestive heart failure.  She denies any swelling of her legs or feeling  like she has excess fluid in her lungs.  She was admitted in December for exacerbation of congestive heart failure and she states this feels different.  PCP Rosita Fire, MD   Past Medical History:  Diagnosis Date  . Anemia    H&H of 10.6/33 and 07/2008 and 11.9/35 and 09/2010  . Anxiety   . CHF (congestive heart failure) ([place])    a. EF 40-45% by echo in 12/2015 with cath showing normal cors  . Depression with anxiety   . Diabetes mellitus without complication ([place])   . Enlarged heart   . Hypertension   . Hypertensive heart disease 2009   Pulmonary edema postpartum; mild to moderate mitral regurgitation when hospitalized for CHF in 2009; Echocardiogram in 12/2009-no MR and normal EF; normal CXR in 09/2010  . Migraine headache   . Miscarriage 03/19/2013  . Obesity 04/16/2009  . Osteoarthritis, knee 03/29/2011  . Preeclampsia   . Pregnant   . Pulmonary edema   . Sleep apnea   . Threatened abortion in early pregnancy 03/15/2013    Patient Active Problem List   Diagnosis Date Noted  . Chronic combined systolic and diastolic CHF (congestive heart failure) ([place]) 06/02/2018  . Uncontrolled type 2 diabetes mellitus with hyperglycemia ([place]) 06/02/2018  .  Influenza B 05/13/2018  . Hypomagnesemia 05/12/2018  . Headache 05/12/2018  . Upper respiratory tract infection   . HCAP (healthcare-associated pneumonia)   . Constipation 10/17/2017  . Bad headache   . CHF exacerbation ([place]) 09/29/2017  . Iron deficiency anemia 05/16/2017  . Vitamin D deficiency 11/25/2016  . Iron deficiency 11/25/2016  . History of acute myocardial infarction 10/05/2016  . CHF (congestive heart failure) ([place]) 05/06/2016  . Diabetes mellitus with complication ([place]) 93/71/6967  . Leukocytosis 05/06/2016  . Neuropathy 05/06/2016  . Depression 04/16/2016  . Chronic tension-type headache, not intractable 04/16/2016  . AKI (acute kidney injury) ([place])   . Hyperkalemia   . Nonischemic cardiomyopathy ([place])   . Acute  on chronic combined systolic and diastolic ACC/AHA stage C congestive heart failure ([place]) 04/03/2016  . Acute on chronic combined systolic and diastolic CHF, NYHA class 4 ([place]) 04/03/2016  . Hypertensive emergency 04/03/2016  . Cardiomyopathy due to hypertension ([place]) 12/22/2015  . Normal coronary arteries 12/22/2015  . Troponin level elevated 12/22/2015  . NSTEMI (non-ST elevated myocardial infarction) ([place]) 12/20/2015  . Dental infection 10/10/2015  . Chest pain 09/05/2015  . Systolic CHF, chronic ([place]) 09/05/2015  . LLQ pain   . Type 2 diabetes mellitus without complication ([place]) 89/38/1017  . Essential hypertension   . 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 Hypertensive urgency 04/22/2014  . Acute CHF ([place]) 04/22/2014  . S/P cesarean section 04/11/2014  . Acute pulmonary edema ([place]) 04/11/2014  . Postoperative anemia 04/11/2014  . Elevated serum creatinine 04/11/2014  . Preeclampsia, severe 04/09/2014  . Pre-eclampsia superimposed on chronic hypertension, antepartum 04/08/2014  . Dyspnea 04/08/2014  . Polyhydramnios in third trimester, antepartum 03/14/2014  . Abnormal maternal glucose tolerance, antepartum 03/11/2014  . High-risk pregnancy 03/11/2014  . Pre-existing essential hypertension complicating pregnancy 51/06/5850  . Impaired glucose tolerance during pregnancy, antepartum 11/27/2013  . Leiomyoma of uterus 11/22/2013  . History of gestational diabetes in prior pregnancy, currently pregnant in first trimester 11/22/2013  . Hx of preeclampsia, prior pregnancy, currently pregnant 11/22/2013  . Short interval between pregnancies affecting pregnancy, antepartum 11/22/2013  . Supervision of high-risk pregnancy of elderly primigravida (>= 52 years old at delivery), third trimester 11/22/2013  . Miscarriage 03/19/2013  . Major depressive disorder, single episode, unspecified 09/27/2011  . Hypertension   . Hypertensive cardiovascular disease   . Microcytic anemia   .  Osteoarthrosis involving lower leg 03/29/2011  . Hypokalemia 12/12/2009  . OSA on CPAP 12/09/2009  . Morbid obesity ([place]) 04/16/2009    Past Surgical History:  Procedure Laterality Date  . BREAST REDUCTION SURGERY  2002  . CARDIAC CATHETERIZATION N/A 12/22/2015   Procedure: Left Heart Cath and Coronary Angiography;  Surgeon: Peter M Martinique, MD;  Location: [facility];  Service: Cardiovascular;  Laterality: N/A;  . CESAREAN SECTION N/A 04/09/2014   Procedure: CESAREAN SECTION;  Surgeon: Mora Bellman, MD;  Location: [facility];  Service: Obstetrics;  Laterality: N/A;  . CHOLECYSTECTOMY       OB History    Gravida  11   Para  6   Term  5   Preterm  1   AB  5   Living  6     SAB  3   TAB  2   Ectopic      Multiple  0   Live Births  6            Home Medications    Prior to Admission medications   Medication Sig  Start Date End Date Taking? Authorizing Provider  albuterol (PROVENTIL HFA;VENTOLIN HFA) 108 (90 Base) MCG/ACT inhaler Inhale 1 puff into the lungs every 6 (six) hours as needed for wheezing or shortness of breath.    [provider]  aspirin EC 81 MG tablet Take 81 mg by mouth daily.    [provider]  budesonide-formoterol (SYMBICORT) 80-4.5 MCG/ACT inhaler Inhale 2 puffs into the lungs 2 (two) times daily. 05/04/18   Manuella Ghazi, Pratik D, DO  carvedilol (COREG) 25 MG tablet Take 2 tablets (50 mg total) by mouth 2 (two) times daily with a meal. 12/06/17   Strader, [place] M, PA-C  citalopram (CELEXA) 20 MG tablet Take 20 mg by mouth daily. 08/19/17   [provider]  gabapentin (NEURONTIN) 600 MG tablet Take 600 mg by mouth 3 (three) times daily.     [provider]  guaiFENesin-dextromethorphan (ROBITUSSIN DM) 100-10 MG/5ML syrup Take 10 mLs by mouth every 4 (four) hours as needed for cough. 05/04/18   Manuella Ghazi, Pratik D, DO  hydrALAZINE (APRESOLINE) 100 MG tablet Take 1 tablet (100 mg total) by mouth 3 (three) times daily.  07/12/17   Arnoldo Lenis, MD  HYDROcodone-homatropine [facility]) 5-1.5 MG/5ML syrup Take 5 mLs by mouth every 6 (six) hours as needed for cough. 05/16/18   Orson Eva, MD  insulin glargine (LANTUS) 100 UNIT/ML injection Inject 30 Units at bedtime into the skin.     [provider]  insulin lispro (HUMALOG) 100 UNIT/ML injection Inject into the skin 3 (three) times daily before meals.    [provider]  Ipratropium-Albuterol (COMBIVENT RESPIMAT) 20-100 MCG/ACT AERS respimat Inhale 1 puff into the lungs every 6 (six) hours as needed for wheezing or shortness of breath. 05/04/18   Manuella Ghazi, Pratik D, DO  Lancets (FREESTYLE) lancets Use as instructed 04/09/16   Arrien, Jimmy Picket, MD  metFORMIN (GLUCOPHAGE) 500 MG tablet Take 500 mg 2 (two) times daily with a meal by mouth.    [provider]  nitroGLYCERIN (NITROSTAT) 0.4 MG SL tablet Place 1 tablet (0.4 mg total) under the tongue every 5 (five) minutes as needed for chest pain. 09/05/15   Kathie Dike, MD  omeprazole (PRILOSEC) 40 MG capsule Take 40 mg by mouth daily.    [provider]  ondansetron (ZOFRAN) 4 MG tablet Take 4 mg by mouth daily as needed for nausea or vomiting.    [provider]  oseltamivir (TAMIFLU) 75 MG capsule Take 1 capsule (75 mg total) by mouth 2 (two) times daily. 05/16/18   Orson Eva, MD  oxyCODONE-acetaminophen (PERCOCET/ROXICET) 5-325 MG tablet Take 1 tablet by mouth every 4 (four) hours as needed for severe pain.    [provider]  potassium chloride SA (K-DUR,KLOR-CON) 20 MEQ tablet Take 1 tablet (20 mEq total) by mouth daily. 05/12/17   Holley Bouche, NP  predniSONE (DELTASONE) 10 MG tablet Take 6 tablets (60 mg total) by mouth daily with breakfast. And decrease by one tablet daily 05/17/18   Tat, Shanon Brow, MD  pyridostigmine (MESTINON) 60 MG tablet Take 60 mg by mouth every 8 (eight) hours.    [provider]  sacubitril-valsartan (ENTRESTO) 97-103  MG Take 1 tablet by mouth 2 (two) times daily. 04/09/16   Arrien, Jimmy Picket, MD  spironolactone (ALDACTONE) 50 MG tablet Take 1 tablet (50 mg total) by mouth daily. 09/30/17 05/12/18  Lorella Nimrod, MD  torsemide (DEMADEX) 20 MG tablet Please take 40 mg oral every morning and 20 mg  every evening. Patient taking differently: Take 20-40 mg 2 (two) times daily by mouth. Please take 40 mg oral every morning and 20 mg every evening. 05/10/16   Elgergawy, Silver Huguenin, MD  traZODone (DESYREL) 100 MG tablet Take 100 mg by mouth 3 (three) times daily after meals. 08/19/17   [provider]    Family History Family History  Problem Relation Age of Onset  . Diabetes Mother 32  . Heart disease Mother   . Hyperlipidemia Paternal Grandfather   . Hypertension Paternal Grandfather   . Heart disease Father   . Hypertension Father   . Heart disease Maternal Grandmother 60  . ADD / ADHD Son   . Hypertension Maternal Uncle   . Heart attack Brother   . Sudden death Neg Hx   . Colon cancer Neg Hx   . Celiac disease Neg Hx   . Inflammatory bowel disease Neg Hx     Social History Social History   Tobacco Use  . Smoking status: Never Smoker  . Smokeless tobacco: Never Used  Substance Use Topics  . Alcohol use: Yes    Comment: occ  . Drug use: No     Allergies   Diclofenac; Tramadol; Vicodin [hydrocodone-acetaminophen]; and Hydrocodone bitartrate er   Review of Systems Review of Systems  All other systems reviewed and are negative.    Physical Exam Updated Vital Signs BP (!) 171/103   Pulse 93   Temp 97.9 F (36.6 C) (Oral)   Resp 19   Ht 5\' 6"  (1.676 m)   Wt 104.3 kg   LMP 05/21/2018 (Approximate)   SpO2 98%   BMI 37.12 kg/m   Vital signs normal except for hypertension   Physical Exam Vitals signs and nursing note reviewed.  Constitutional:      General: She is not in acute distress.    Appearance: Normal appearance. She is well-developed. She is not  ill-appearing or toxic-appearing.     Comments: Laying almost flat in bed without distress, laughing and joking  HENT:     Head: Normocephalic and atraumatic.     Right Ear: External ear normal.     Left Ear: External ear normal.     Nose: Nose normal. No mucosal edema or rhinorrhea.     Mouth/Throat:     Dentition: No dental abscesses.     Pharynx: No uvula swelling.  Eyes:     Conjunctiva/sclera: Conjunctivae normal.     Pupils: Pupils are equal, round, and reactive to light.  Neck:     Musculoskeletal: Full passive range of motion without pain, normal range of motion and neck supple.  Cardiovascular:     Rate and Rhythm: Normal rate and regular rhythm.     Heart sounds: Normal heart sounds. No murmur. No friction rub. No gallop.   Pulmonary:     Effort: Pulmonary effort is normal. No respiratory distress.     Breath sounds: Normal breath sounds. No wheezing, rhonchi or rales.  Chest:     Chest wall: No tenderness or crepitus.    Abdominal:     General: Bowel sounds are normal. There is no distension.     Palpations: Abdomen is soft.     Tenderness: There is no abdominal tenderness. There is no guarding or rebound.  Musculoskeletal: Normal range of motion.        General: No tenderness.     Comments: Moves all extremities well.   Skin:    General: Skin is warm and dry.  Coloration: Skin is not pale.     Findings: No erythema or rash.  Neurological:     Mental Status: She is alert and oriented to person, place, and time.     Cranial Nerves: No cranial nerve deficit.  Psychiatric:        Mood and Affect: Mood is not anxious.        Speech: Speech normal.        Behavior: Behavior normal.      ED Treatments / Results  Labs (all labs ordered are listed, but only abnormal results are displayed) Results for orders placed or performed during the hospital encounter of 06/02/18  CBC  Result Value Ref Range   WBC 8.3 4.0 - 10.5 K/uL   RBC 4.24 3.87 - 5.11 MIL/uL    Hemoglobin 9.1 (L) 12.0 - 15.0 g/dL   HCT 30.9 (L) 36.0 - 46.0 %   MCV 72.9 (L) 80.0 - 100.0 fL   MCH 21.5 (L) 26.0 - 34.0 pg   MCHC 29.4 (L) 30.0 - 36.0 g/dL   RDW 16.2 (H) 11.5 - 15.5 %   Platelets 192 150 - 400 K/uL   nRBC 0.0 0.0 - 0.2 %  Comprehensive metabolic panel  Result Value Ref Range   Sodium 134 (L) 135 - 145 mmol/L   Potassium 4.0 3.5 - 5.1 mmol/L   Chloride 99 98 - 111 mmol/L   CO2 25 22 - 32 mmol/L   Glucose, Bld 317 (H) 70 - 99 mg/dL   BUN 17 6 - 20 mg/dL   Creatinine, Ser 0.83 0.44 - 1.00 mg/dL   Calcium 8.9 8.9 - 10.3 mg/dL   Total Protein 6.6 6.5 - 8.1 g/dL   Albumin 3.3 (L) 3.5 - 5.0 g/dL   AST 20 15 - 41 U/L   ALT 20 0 - 44 U/L   Alkaline Phosphatase 65 38 - 126 U/L   Total Bilirubin 0.4 0.3 - 1.2 mg/dL   GFR calc non Af Amer >60 >60 mL/min   GFR calc Af Amer >60 >60 mL/min   Anion gap 10 5 - 15  Brain natriuretic peptide  Result Value Ref Range   B Natriuretic Peptide 660.0 (H) 0.0 - 100.0 pg/mL  I-stat troponin, ED  Result Value Ref Range   Troponin i, poc 0.04 0.00 - 0.08 ng/mL   Comment 3          I-Stat beta hCG blood, ED  Result Value Ref Range   I-stat hCG, quantitative <5.0 <5 mIU/mL   Comment 3          I-stat troponin, ED  Result Value Ref Range   Troponin i, poc 0.03 0.00 - 0.08 ng/mL   Comment 3           Laboratory interpretation all normal except mildly elevated BNP, hyperglycemia without metabolic acidosis, some worsening of her baseline anemia since December, her hemoglobin has dropped from 11-9    EKG EKG Interpretation  Date/Time:  Friday June 02 2018 03:06:58 EST Ventricular Rate:  90 PR Interval:    QRS Duration: 101 QT Interval:  391 QTC Calculation: 479 R Axis:   81 Text Interpretation:  Sinus rhythm Consider left atrial enlargement Abnormal T, consider ischemia, diffuse leads No significant change since last tracing 12 May 2018 Confirmed by Rolland Porter [phone]) on 06/02/2018 7:05:43 AM     Radiology  Ct Head  Wo Contrast  Result Date: 06/02/2018 CLINICAL DATA:  Hypertension with headache EXAM: CT HEAD WITHOUT  CONTRAST TECHNIQUE: Contiguous axial images were obtained from the base of the skull through the vertex without intravenous contrast. COMPARISON:  09/29/2017 FINDINGS: Brain: No evidence of acute infarction, hemorrhage, hydrocephalus, extra-axial collection or mass lesion/mass effect. Vascular: No hyperdense vessel or unexpected calcification. Skull: Normal. Negative for fracture or focal lesion. Sinuses/Orbits: Perforated nasal septum seen since at least 2017. IMPRESSION: No acute finding or explanation for headache. Electronically Signed   By: Monte Fantasia M.D.   On: 06/02/2018 07:39     Dg Chest 2 View  Result Date: 06/02/2018 CLINICAL DATA:  Chest pain EXAM: CHEST - 2 VIEW COMPARISON:  05/12/2018 FINDINGS: Chronic cardiomegaly. There is no edema, consolidation, effusion, or pneumothorax. Artifact from EKG leads. IMPRESSION: 1. No acute finding. 2. Chronic cardiomegaly Electronically Signed   By: Monte Fantasia M.D.   On: 06/02/2018 04:20    Procedures .Critical Care Performed by: Rolland Porter, MD Authorized by: Rolland Porter, MD   Critical care provider statement:    Critical care time (minutes):  38   Critical care was necessary to treat or prevent imminent or life-threatening deterioration of the following conditions: Cardiopulmonary decompensation.   Critical care was time spent personally by me on the following activities:  Discussions with consultants, evaluation of patient's response to treatment, examination of patient, obtaining history from patient or surrogate, ordering and review of laboratory studies, ordering and review of radiographic studies, pulse oximetry, re-evaluation of patient's condition and review of old charts   (including critical care time)  Medications Ordered in ED Medications  enoxaparin (LOVENOX) injection 50 mg (has no administration in time range)    ondansetron (ZOFRAN) tablet 4 mg (has no administration in time range)    Or  ondansetron (ZOFRAN) injection 4 mg (has no administration in time range)  acetaminophen (TYLENOL) tablet 650 mg (has no administration in time range)    Or  acetaminophen (TYLENOL) suppository 650 mg (has no administration in time range)  insulin aspart (novoLOG) injection 0-20 Units (has no administration in time range)  labetalol (NORMODYNE,TRANDATE) injection 10 mg (has no administration in time range)  nitroGLYCERIN (NITROGLYN) 2 % ointment 1 inch (1 inch Topical Given 06/02/18 0412)  acetaminophen (TYLENOL) tablet 1,000 mg (1,000 mg Oral Given 06/02/18 0420)     Initial Impression / Assessment and Plan / ED Course  I have reviewed the triage vital signs and the nursing notes.  Pertinent labs & imaging results that were available during my care of the patient were reviewed by me and considered in my medical decision making (see chart for details).    I reviewed the patient's chest x-ray with her at time of my initial exam and and it looked improved from her last chest x-ray in December.  There did not appear to be as much fluid in her lungs.  She was given nitroglycerin 1 inch to her skin.  She was given acetaminophen for the nitroglycerin headache.  Laboratory testing was done.  Patient has a history of MI in the past, delta troponin was done.  I am going to have nursing staff ambulate patient and see what her oxygen does.  At this point she is been having exertional shortness of breath and now chest pain without exertion.  She does have a history of MI in the past and congestive heart failure.  She has a new drop in her hemoglobin which may be a new stress on her underlying heart problems.  I am going to talk to the hospitalist about admission,  she may need a blood transfusion and evaluation by cardiology again.  Patient was ambulated by nursing staff who report her pulse ox was 99% while ambulating with  heart rate of 95.  Patient's main complaint was headache but denied chest pain or shortness of breath.  I talked to the patient around 6:40 AM and discussed her test results.  Her main complaint now is bad headache.  I am going to give her some labetalol.  We discussed possible need for admission and she is agreeable.  She states that she did not get walked far enough to have her symptoms recur.  Patient speech is noted to be slightly slower and she seems less interactive than before.  06:49 AM Dr. Olevia Bowens will admit  Due to her complaint of severe headache patient had a head CT done, I also gave her some labetalol even though her blood pressure was not extremely high it is still uncontrolled.  Review of her records show her last cardiac cath was in 2017 and she had normal coronary arteries, she has nonischemic cardiomyopathy, her last echo in 2017 showed ejection fraction of 40 to 45% and she has OSA.  7:55 AM patient's head CT is normal, her blood pressure is 159/107.  Final Clinical Impressions(s) / ED Diagnoses   Final diagnoses:  Precordial pain  Dyspnea on exertion  Anemia, unspecified type  Nonintractable headache, unspecified chronicity pattern, unspecified headache type    Plan admission   Rolland Porter, MD 06/02/18 [phone]

## 2018-06-02 NOTE — ED Notes (Signed)
Patient's room air O2 sats were 99 percent while ambulating with pulse rate of 95. Patient states she is still having a severe headache at this time but denied any chest pain or shortness of breath at this time.

## 2018-06-02 NOTE — Progress Notes (Signed)
CRITICAL VALUE ALERT  Critical Value:  Troponin 0.03  Date & Time Notied:  06/02/18 2001  Provider Notified: Mid Level  Orders Received/Actions taken: No new orders at this time

## 2018-06-02 NOTE — ED Triage Notes (Signed)
Pt c/o chest pain off and on x 4 days.  Pt received 3 nitro with some relief and 324 aspirin.

## 2018-06-02 NOTE — Progress Notes (Signed)
CRITICAL VALUE ALERT  Critical Value:  Troponin 0.03  Date & Time Notied:  06/02/18 10:24 AM  Provider Notified: Orson Eva, MD  Orders Received/Actions taken: Awaiting new orders. Will continue to monitor.

## 2018-06-02 NOTE — Consult Note (Addendum)
Cardiology Consult    Patient ID: Chelsey Santiago; [id number]; 25-Nov-1975   Admit date: 06/02/2018 Date of Consult: 06/02/2018  Primary Care Provider: Rosita Fire, MD Primary Cardiologist: Carlyle Dolly, MD   Patient Profile    Chelsey Santiago is a 43 y.o. female with past medical history of chronic combined systolic and diastolic CHF (EF 38-10% by echo in 12/2015 with cath showing normal cors), nonischemic cardiomyopathy (EF previously 35% in 2015, improved to 45-50% by most recent echo in 04/2018), HTN, IDDM and OSA who is being seen today for the evaluation of chest pain at the request of Dr. Carles Collet.   History of Present Illness    [name] was last examined by myself in 11/2017 and reported baseline dyspnea on exertion but denied any recent change in her symptoms. Was reporting intermittent episodes of chest discomfort which would occur when she was stressed and only lasting for a few seconds then spontaneously resolving. EKG showed no acute ischemic changes and given that her recent catheterization showed normal coronary arteries, no further ischemic testing was pursued at that time.  She was continued on Entresto, Spironolactone, Hydralazine, and Torsemide with Carvedilol being further titrated to 50 mg twice daily in the setting of her elevated BP (weight greater than 85 kg).  In the interim, she was admitted to [facility] in 04/2018 for evaluation of worsening dyspnea and lower extremity edema. BNP was elevated to 1812 and she was also thought to have acute bronchitis. She was treated with IV Lasix during admission along with antibiotic therapy. Discharge weight was 105.5 kg and she was continued on her current cardiac regimen including Torsemide 40mg  in AM/20mg  in PM.   She presented back to [facility] ED earlier this morning for evaluation of chest discomfort over the past 4 days. In talking with the patient today, she reports feeling more stressed over the past 2 weeks and  during this timeframe, her BP has been elevated. Starting 3 to 4 days ago, she started to experience a discomfort along her sternal region which was mostly notable with activity. Reports associated dyspnea with this. She took Zantac with no improvement in her symptoms and was unable to locate her nitroglycerin to try this. Denies any recent orthopnea, PND, lower extremity edema, or palpitations.  Reports good compliance with her current medication regimen.  Initial labs show WBC 8.3, Hgb 9.1, platelets 192, Na+ 134, K+ 4.0, and creatinine 0.83 (improved from 1.12 when checked 2 weeks prior). BNP 660. Initial and delta troponin values have been negative with cyclic values pending. CXR showing no acute cardiopulmonary abnormalities. CT Head with no acute findings. EKG shows NSR, HR 90, with diffuse TWI along inferior and lateral leads. More prominent along inferior leads when compared to tracings from 04/2018 but similar to prior tracings in 08/2016.   Past Medical History:  Diagnosis Date  . Anemia    H&H of 10.6/33 and 07/2008 and 11.9/35 and 09/2010  . Anxiety   . CHF (congestive heart failure) ([place])    a. EF 40-45% by echo in 12/2015 with cath showing normal cors  . Depression with anxiety   . Diabetes mellitus without complication ([place])   . Enlarged heart   . Hypertension   . Hypertensive heart disease 2009   Pulmonary edema postpartum; mild to moderate mitral regurgitation when hospitalized for CHF in 2009; Echocardiogram in 12/2009-no MR and normal EF; normal CXR in 09/2010  . Migraine headache   . Miscarriage 03/19/2013  .  Obesity 04/16/2009  . Osteoarthritis, knee 03/29/2011  . Preeclampsia   . Pregnant   . Pulmonary edema   . Sleep apnea   . Threatened abortion in early pregnancy 03/15/2013    Past Surgical History:  Procedure Laterality Date  . BREAST REDUCTION SURGERY  2002  . CARDIAC CATHETERIZATION N/A 12/22/2015   Procedure: Left Heart Cath and Coronary Angiography;  Surgeon:  Peter M Martinique, MD;  Location: [facility];  Service: Cardiovascular;  Laterality: N/A;  . CESAREAN SECTION N/A 04/09/2014   Procedure: CESAREAN SECTION;  Surgeon: Mora Bellman, MD;  Location: [facility];  Service: Obstetrics;  Laterality: N/A;  . CHOLECYSTECTOMY       Home Medications:  Prior to Admission medications   Medication Sig Start Date End Date Taking? Authorizing Provider  albuterol (PROVENTIL HFA;VENTOLIN HFA) 108 (90 Base) MCG/ACT inhaler Inhale 1 puff into the lungs every 6 (six) hours as needed for wheezing or shortness of breath.    [provider]  aspirin EC 81 MG tablet Take 81 mg by mouth daily.    [provider]  budesonide-formoterol (SYMBICORT) 80-4.5 MCG/ACT inhaler Inhale 2 puffs into the lungs 2 (two) times daily. 05/04/18   Manuella Ghazi, Pratik D, DO  carvedilol (COREG) 25 MG tablet Take 2 tablets (50 mg total) by mouth 2 (two) times daily with a meal. 12/06/17   Strader, [place] M, PA-C  citalopram (CELEXA) 20 MG tablet Take 20 mg by mouth daily. 08/19/17   [provider]  gabapentin (NEURONTIN) 600 MG tablet Take 600 mg by mouth 3 (three) times daily.     [provider]  guaiFENesin-dextromethorphan (ROBITUSSIN DM) 100-10 MG/5ML syrup Take 10 mLs by mouth every 4 (four) hours as needed for cough. 05/04/18   Manuella Ghazi, Pratik D, DO  hydrALAZINE (APRESOLINE) 100 MG tablet Take 1 tablet (100 mg total) by mouth 3 (three) times daily. 07/12/17   Arnoldo Lenis, MD  HYDROcodone-homatropine [facility]) 5-1.5 MG/5ML syrup Take 5 mLs by mouth every 6 (six) hours as needed for cough. 05/16/18   Orson Eva, MD  insulin glargine (LANTUS) 100 UNIT/ML injection Inject 30 Units at bedtime into the skin.     [provider]  insulin lispro (HUMALOG) 100 UNIT/ML injection Inject into the skin 3 (three) times daily before meals.    [provider]  Ipratropium-Albuterol (COMBIVENT RESPIMAT) 20-100 MCG/ACT AERS respimat Inhale 1 puff into  the lungs every 6 (six) hours as needed for wheezing or shortness of breath. 05/04/18   Manuella Ghazi, Pratik D, DO  Lancets (FREESTYLE) lancets Use as instructed 04/09/16   Arrien, Jimmy Picket, MD  metFORMIN (GLUCOPHAGE) 500 MG tablet Take 500 mg 2 (two) times daily with a meal by mouth.    [provider]  nitroGLYCERIN (NITROSTAT) 0.4 MG SL tablet Place 1 tablet (0.4 mg total) under the tongue every 5 (five) minutes as needed for chest pain. 09/05/15   Kathie Dike, MD  omeprazole (PRILOSEC) 40 MG capsule Take 40 mg by mouth daily.    [provider]  ondansetron (ZOFRAN) 4 MG tablet Take 4 mg by mouth daily as needed for nausea or vomiting.    [provider]  oseltamivir (TAMIFLU) 75 MG capsule Take 1 capsule (75 mg total) by mouth 2 (two) times daily. 05/16/18   Orson Eva, MD  oxyCODONE-acetaminophen (PERCOCET/ROXICET) 5-325 MG tablet Take 1 tablet by mouth every 4 (four) hours as needed for severe pain.    [provider]  potassium chloride  SA (K-DUR,KLOR-CON) 20 MEQ tablet Take 1 tablet (20 mEq total) by mouth daily. 05/12/17   Holley Bouche, NP  predniSONE (DELTASONE) 10 MG tablet Take 6 tablets (60 mg total) by mouth daily with breakfast. And decrease by one tablet daily 05/17/18   Tat, Shanon Brow, MD  pyridostigmine (MESTINON) 60 MG tablet Take 60 mg by mouth every 8 (eight) hours.    [provider]  sacubitril-valsartan (ENTRESTO) 97-103 MG Take 1 tablet by mouth 2 (two) times daily. 04/09/16   Arrien, Jimmy Picket, MD  spironolactone (ALDACTONE) 50 MG tablet Take 1 tablet (50 mg total) by mouth daily. 09/30/17 05/12/18  Lorella Nimrod, MD  torsemide (DEMADEX) 20 MG tablet Please take 40 mg oral every morning and 20 mg every evening. Patient taking differently: Take 20-40 mg 2 (two) times daily by mouth. Please take 40 mg oral every morning and 20 mg every evening. 05/10/16   Elgergawy, Silver Huguenin, MD  traZODone (DESYREL) 100 MG tablet Take 100 mg  by mouth 3 (three) times daily after meals. 08/19/17   [provider]    Inpatient Medications: Scheduled Meds: . enoxaparin (LOVENOX) injection  0.5 mg/kg Subcutaneous Q24H  . insulin aspart  0-20 Units Subcutaneous TID WC  . labetalol  10 mg Intravenous Once   Continuous Infusions:  PRN Meds: acetaminophen **OR** acetaminophen, ondansetron **OR** ondansetron (ZOFRAN) IV  Allergies:    Allergies  Allergen Reactions  . Diclofenac Swelling    AND POSSIBLE SYNCOPE; tolerates ibuprofen per pt  . Tramadol Nausea And Vomiting and Nausea Only    Itching (12/21); tolerates ibuprofen per pt  . Vicodin [Hydrocodone-Acetaminophen] Itching and Nausea Only  . Hydrocodone Bitartrate Er Itching    Social History:   Social History   Socioeconomic History  . Marital status: Married    Spouse name: Not on file  . Number of children: Not on file  . Years of education: Not on file  . Highest education level: Not on file  Occupational History  . Occupation: unemployed    Fish farm manager: UNEMPLOYED  Social Needs  . Financial resource strain: Not on file  . Food insecurity:    Worry: Not on file    Inability: Not on file  . Transportation needs:    Medical: Not on file    Non-medical: Not on file  Tobacco Use  . Smoking status: Never Smoker  . Smokeless tobacco: Never Used  Substance and Sexual Activity  . Alcohol use: Yes    Comment: occ  . Drug use: No  . Sexual activity: Not Currently    Birth control/protection: Surgical    Comment: tubal  Lifestyle  . Physical activity:    Days per week: Not on file    Minutes per session: Not on file  . Stress: Not on file  Relationships  . Social connections:    Talks on phone: Not on file    Gets together: Not on file    Attends religious service: Not on file    Active member of club or organization: Not on file    Attends meetings of clubs or organizations: Not on file    Relationship status: Not on file  . Intimate partner  violence:    Fear of current or ex partner: Not on file    Emotionally abused: Not on file    Physically abused: Not on file    Forced sexual activity: Not on file  Other Topics Concern  . Not on file  Social History  Narrative   Lives in [place]   Engaged/boyfriend (father to youngest chid)   4 children: daughter (82 as of 2013), sons (15, 32, 76 as of 2013)   Religion: christian     Family History:    Family History  Problem Relation Age of Onset  . Diabetes Mother 33  . Heart disease Mother   . Hyperlipidemia Paternal Grandfather   . Hypertension Paternal Grandfather   . Heart disease Father   . Hypertension Father   . Heart disease Maternal Grandmother 60  . ADD / ADHD Son   . Hypertension Maternal Uncle   . Heart attack Brother   . Sudden death Neg Hx   . Colon cancer Neg Hx   . Celiac disease Neg Hx   . Inflammatory bowel disease Neg Hx       Review of Systems    General:  No chills, fever, night sweats or weight changes.  Cardiovascular:  No edema, orthopnea, palpitations, paroxysmal nocturnal dyspnea. Positive for chest pain and dyspnea on exertion.  Dermatological: No rash, lesions/masses Respiratory: No cough, dyspnea Urologic: No hematuria, dysuria Abdominal:   No nausea, vomiting, diarrhea, bright red blood per rectum, melena, or hematemesis Neurologic:  No visual changes, wkns, changes in mental status.  All other systems reviewed and are otherwise negative except as noted above.  Physical Exam/Data    Vitals:   06/02/18 0630 06/02/18 0700 06/02/18 0730 06/02/18 0800  BP: (!) 169/103 (!) 159/107 (!) 160/112 (!) 169/104  Pulse: 77 73 78 83  Resp: 10 18 19  (!) 25  Temp:      TempSrc:      SpO2: 98% 97% 99% 100%  Weight:      Height:       No intake or output data in the 24 hours ending 06/02/18 0834 Filed Weights   06/02/18 0305  Weight: 104.3 kg   Body mass index is 37.12 kg/m.   General: Pleasant, African American female appearing in  NAD Psych: Normal affect. Neuro: Alert and oriented X 3. Moves all extremities spontaneously. HEENT: Normal  Neck: Supple without bruits or JVD. Lungs:  Resp regular and unlabored, CTA without wheezing or rales. Heart: RRR no s3, s4, or murmurs. Abdomen: Soft, non-tender, non-distended, BS + x 4.  Extremities: No clubbing, cyanosis or lower extremity edema. DP/PT/Radials 2+ and equal bilaterally.   Labs/Studies     Relevant CV Studies:  Cardiac Catheterization: 12/2015  The left ventricular systolic function is normal.  LV end diastolic pressure is mildly elevated.  The left ventricular ejection fraction is 50-55% by visual estimate.   1. No significant CAD 2. Good LV function 3. Mildly elevated LV EDP.  Plan: medical therapy  Echocardiogram: 04/30/2018 Study Conclusions  - Left ventricle: The cavity size was normal. Wall thickness was   increased in a pattern of moderate LVH. There was moderate   concentric hypertrophy. Systolic function was mildly reduced. The   estimated ejection fraction was in the range of 45% to 50%.   Diffuse hypokinesis. Features are consistent with a pseudonormal   left ventricular filling pattern, with concomitant abnormal   relaxation and increased filling pressure (grade 2 diastolic   dysfunction). - Left atrium: The atrium was moderately dilated. - Pulmonary arteries: Systolic pressure was mildly increased. PA   peak pressure: 44 mm Hg (S).  Impressions:  - Improved EF when compared to prior.  Laboratory Data:  Chemistry Recent Labs  Lab 06/02/18 0405  NA 134*  K 4.0  CL 99  CO2 25  GLUCOSE 317*  BUN 17  CREATININE 0.83  CALCIUM 8.9  GFRNONAA >60  GFRAA >60  ANIONGAP 10    Recent Labs  Lab 06/02/18 0405  PROT 6.6  ALBUMIN 3.3*  AST 20  ALT 20  ALKPHOS 65  BILITOT 0.4   Hematology Recent Labs  Lab 06/02/18 0405  WBC 8.3  RBC 4.24  HGB 9.1*  HCT 30.9*  MCV 72.9*  MCH 21.5*  MCHC 29.4*  RDW 16.2*   PLT 192   Cardiac EnzymesNo results for input(s): TROPONINI in the last 168 hours.  Recent Labs  Lab 06/02/18 0407 06/02/18 0547  TROPIPOC 0.04 0.03    BNP Recent Labs  Lab 06/02/18 0405  BNP 660.0*    DDimer No results for input(s): DDIMER in the last 168 hours.  Radiology/Studies:  Dg Chest 2 View  Result Date: 06/02/2018 CLINICAL DATA:  Chest pain EXAM: CHEST - 2 VIEW COMPARISON:  05/12/2018 FINDINGS: Chronic cardiomegaly. There is no edema, consolidation, effusion, or pneumothorax. Artifact from EKG leads. IMPRESSION: 1. No acute finding. 2. Chronic cardiomegaly Electronically Signed   By: Monte Fantasia M.D.   On: 06/02/2018 04:20   Ct Head Wo Contrast  Result Date: 06/02/2018 CLINICAL DATA:  Hypertension with headache EXAM: CT HEAD WITHOUT CONTRAST TECHNIQUE: Contiguous axial images were obtained from the base of the skull through the vertex without intravenous contrast. COMPARISON:  09/29/2017 FINDINGS: Brain: No evidence of acute infarction, hemorrhage, hydrocephalus, extra-axial collection or mass lesion/mass effect. Vascular: No hyperdense vessel or unexpected calcification. Skull: Normal. Negative for fracture or focal lesion. Sinuses/Orbits: Perforated nasal septum seen since at least 2017. IMPRESSION: No acute finding or explanation for headache. Electronically Signed   By: Monte Fantasia M.D.   On: 06/02/2018 07:39     Assessment & Plan    1. Exertional Chest Pain - She presents with a 4-day history of intermittent chest pain which has been occurring with exertion and improving with rest.  Sublingual nitroglycerin was administered in the ED and she reports her pain has now resolved at this time. Initial and delta I-STAT troponin negative with cyclic values pending. EKG shows NSR, HR 90, with diffuse TWI along inferior and lateral leads. More prominent along inferior leads when compared to tracings from 04/2018 but similar to prior tracings in 08/2016. - her story is  concerning for angina but she reports similar episodes in the past when under increased stress and BP was elevated. Cardiac catheterization in 2017 showed no significant CAD and most recent echo in 04/2018 showed her EF had actually further improved to 45-50%. Continue to cycle cardiac enzymes. If trend overall flat, would not anticipate repeat ischemic evaluation this admission and would focus on further management of her HTN. If she does rule-in, would require repeat cath. Continue ASA and BB therapy for now. Not on statin therapy PTA (LDL 81 in 04/2017). Will check FLP with AM labs.   2. Chronic Combined Systolic and Diastolic CHF/ History of Nonischemic Cardiomyopathy - EF previously reduced to 35% in 2015, improved to 45-50% by most recent echo in 04/2018.  She denies any recent orthopnea, PND, or lower extremity edema.  Appears euvolemic by examination.  BNP was elevated to 660 and CXR shows no acute cardiopulmonary abnormalities. - would continue with PTA medication regimen at this time including Torsemide 40mg  in AM/20mg  in Pm, Coreg, Entresto, and Spironolactone.   3. Accelerated HTN - She reports SBP was greater than 200 when checked  by EMS. BP has been elevated to 159/87 - 176/115 since admission. Currently due for her AM medications.  - on Coreg 50mg  BID, Hydralazine 100mg  TID, Entresto 97-103mg  BID, and Spironolactone 50mg  daily as an outpatient. If BP remains above goal, would consider initiation of Amlodipine.   4. OSA - compliance with CPAP recommended. Followed by Pulmonology as an outpatient.   For questions or updates, please contact [place] Please consult [email or web address] for contact info under Cardiology/STEMI.  Signed, Erma Heritage, PA-C 06/02/2018, 8:34 AM Pager: [phone]   Attending note:  Patient seen and examined.  I reviewed her records and discussed the case with Ms. Ahmed Prima PA-C.  [name] has a history of nonischemic cardiomyopathy, no  significant CAD at cardiac catheterization in 2017, difficult to control hypertension, OSA, and type 2 diabetes mellitus.  Most recent assessment of LVEF was 45 to 50% as of December 2019.  She now presents with recent intermittent episodes of chest discomfort, some prolonged, also exertional component and association with psychosocial stress.  She reports dyspnea on exertion, no definite change in weight or increased leg swelling.  She states that she is taking her medications regularly.  On examination she reports no active chest pain.  Recent blood pressures reviewed and significantly elevated, both systolic and diastolic.  Lungs exhibit decreased breath sounds but no crackles, cardiac exam with RRR no gallop.  She has no pitting edema.  Lab work shows troponin I 0.04 and 0.03, BNP 660, potassium 4.0, BUN 17, creatinine 0.83, hemoglobin 9.1, platelets 192.  Chest x-ray reports chronic appearing cardiomegaly with no active edema.  I personally reviewed her ECG which shows sinus rhythm with left atrial enlargement, inferolateral ST-T wave abnormalities.  Patient presents with recent exertional chest pain and dyspnea in the setting of emotional stress, reported compliance with medical therapy, no obvious change in weight.  Blood pressure is not well controlled, significantly hypertensive at presentation.  Troponin I trend at this point is not particularly suggestive of ACS in this could be consistent with hypertensive urgency presentation which she has experienced in the past. She had no significant CAD at cardiac catheterization in 2017 with known nonischemic cardiomyopathy and actually improvement in LVEF by her most recent echocardiogram, 45 to 50% range.  Would cycle cardiac markers and if trend remains flat, do not anticipate follow-up ischemic testing at this time.  Would focus rather on medication adjustments aimed at better blood pressure control.  Actually, her regimen is quite good at baseline, may  need to consider adding amlodipine next.  If troponin I trends upward or she continues to have exertional chest pain and progressive dyspnea despite medication adjustments, could consider a follow-up cardiac catheterization.  Satira Sark, M.D., F.A.C.C.

## 2018-06-03 DIAGNOSIS — I208 Other forms of angina pectoris: Secondary | ICD-10-CM | POA: Diagnosis not present

## 2018-06-03 DIAGNOSIS — R0609 Other forms of dyspnea: Secondary | ICD-10-CM | POA: Diagnosis not present

## 2018-06-03 DIAGNOSIS — I5042 Chronic combined systolic (congestive) and diastolic (congestive) heart failure: Secondary | ICD-10-CM | POA: Diagnosis not present

## 2018-06-03 DIAGNOSIS — R072 Precordial pain: Secondary | ICD-10-CM | POA: Diagnosis not present

## 2018-06-03 LAB — GLUCOSE, CAPILLARY
Glucose-Capillary: 274 mg/dL — ABNORMAL HIGH (ref 70–99)
Glucose-Capillary: 325 mg/dL — ABNORMAL HIGH (ref 70–99)

## 2018-06-03 LAB — BASIC METABOLIC PANEL
Anion gap: 10 (ref 5–15)
BUN: 23 mg/dL — ABNORMAL HIGH (ref 6–20)
CO2: 26 mmol/L (ref 22–32)
CREATININE: 1.05 mg/dL — AB (ref 0.44–1.00)
Calcium: 9.2 mg/dL (ref 8.9–10.3)
Chloride: 102 mmol/L (ref 98–111)
GFR calc Af Amer: >60 mL/min (ref >60)
GFR calc non Af Amer: >60 mL/min (ref >60)
Glucose, Bld: 258 mg/dL — ABNORMAL HIGH (ref 70–99)
Potassium: 3.9 mmol/L (ref 3.5–5.1)
Sodium: 138 mmol/L (ref 135–145)

## 2018-06-03 LAB — LIPID PANEL
Cholesterol: 211 mg/dL — ABNORMAL HIGH (ref 0–200)
HDL: 39 mg/dL — ABNORMAL LOW (ref >40)
LDL Cholesterol: 127 mg/dL — ABNORMAL HIGH (ref 0–99)
Total CHOL/HDL Ratio: 5.4 RATIO
Triglycerides: 223 mg/dL — ABNORMAL HIGH (ref <150)
VLDL: 45 mg/dL — ABNORMAL HIGH (ref 0–40)

## 2018-06-03 MED ORDER — ATORVASTATIN CALCIUM 20 MG PO TABS: 20.0000 mg | ORAL_TABLET | Freq: Every day | ORAL | 1 refills | Status: DC

## 2018-06-03 MED ORDER — ATORVASTATIN CALCIUM 20 MG PO TABS: 20.0000 mg | ORAL_TABLET | Freq: Every day | ORAL | Status: DC

## 2018-06-03 MED ORDER — HYDRALAZINE HCL 100 MG PO TABS: 100.0000 mg | ORAL_TABLET | Freq: Three times a day (TID) | ORAL | 1 refills | Status: DC

## 2018-06-03 MED ORDER — TRAZODONE HCL 50 MG PO TABS: 50.0000 mg | ORAL_TABLET | Freq: Every day | ORAL | 0 refills | Status: DC

## 2018-06-03 MED ORDER — CARVEDILOL 25 MG PO TABS: 50.0000 mg | ORAL_TABLET | Freq: Two times a day (BID) | ORAL | 1 refills | Status: DC

## 2018-06-03 NOTE — Discharge Summary (Signed)
Physician Discharge Summary  Chelsey Santiago YTK:[id number] DOB: 1976-01-02 DOA: 06/02/2018  PCP: Rosita Fire, MD  Admit date: 06/02/2018 Discharge date: 06/03/2018  Admitted From: Home Disposition:  Home   Recommendations for Outpatient Follow-up:  1. Follow up with PCP in 1-2 weeks 2. Please obtain BMP/CBC in one week    Discharge Condition: Stable CODE STATUS:FULL Diet recommendation: Heart Healthy / Carb Modified   Brief/Interim Summary: 43 y.o. female with medical history of diabetes mellitus type 2, hypertension, systolic and diastolic CHF, OSA, depression presenting with 4-day history of centralized chest discomfort.  The patient describes it as a burning sensation that is somewhat worse with exertion with associated shortness of breath.  The patient states that she developed chest discomfort or shortness of breath at the grocery store on 06/01/2018.  Her symptoms continued to increase in frequency over the past 24 hours.  In the early a.m. 06/02/2018, the patient woke up with chest discomfort.  As result, the patient presented for further evaluation.  She endorses compliance with all her medications including her diuretics and antihypertensive medications.  She denies any fevers, chills, nausea, vomiting, diarrhea, abdominal pain, dysuria, hematuria but there is been no coughing or hemoptysis.  The patient was given sublingual nitroglycerin by EMS which relieved the chest discomfort. Notably, the patient was recently discharged from the hospital after a stay from 05/12/2018 through 05/16/2018 during which she was treated for influenza as well as acute on chronic systolic and diastolic CHF.  The patient was discharged home on her usual torsemide regimen. In the emergency department, the patient was afebrile hemodynamically stable.  She was hypertensive with systolic blood pressures in the 557D and diastolics in the 220U.  BMP, LFTs, and CBC were essentially unremarkable with her  hemoglobin near baseline.  Chest x-ray was negative.  The patient was admitted for further evaluation. Troponins were checked.  Although minimally elevated, the trend was flat.  Cardiology was consulted.  They felt that the chest discomfort may be related to her elevated blood pressure.  The patient antihypertensive regimen was restarted.  Remarkably, her blood pressure improved.  This gives a suspicion that there is likely some poor compliance issues at home.  In fact, the patient asked me for refills of her carvedilol and hydralazine at the time of discharge because she had ran out.  With improvement of her blood pressure, the patient's chest discomfort resolved.  The patient ambulated the hallways without any further chest discomfort or shortness of breath.  Discharge Diagnoses:  Exertional chest pain -She has typical and atypical components -12/22/2015 heart catheterization--no significant CAD -EKG shows ST-T wave depression in II, III, aVF, V5-V6  -Cardiology consult--appreciated--> felt that the patient's elevated blood pressure likely contributing to the patient's chest discomfort -Cycle troponins--0.034 -The patient was restarted on her antihypertensive regimen with improvement of her blood pressure and exertional chest discomfort.  Chronic systolic and diastolic CHF -Likely secondary to uncontrolled hypertension -04/30/2018 echo EF 45-50%, grade 2 DD, diffuse HK, PASP 44, trivial TR -Appears to be clinically compensated -Continue carvedilol, Entresto, torsemide home dose -The patient endorses indiscretion with fluid intake  Diabetes mellitus type 2, uncontrolled with hyperglycemia -Holding metformin -Continue Lantus 30 units daily -NovoLog sliding scale -05/12/2018 hemoglobin A1c 8.5  Essential hypertension, poorly controlled -Continue carvedilol, hydralazine, spironolactone -Poor compliance at home  Depression -Continue Celexa  Diabetic polyneuropathy -Continue  gabapentin  Iron deficiency anemia -Baseline hemoglobin~10  Obesity -BMI 37.12  Hyperlipidemia -LDL 127 -start statin    Discharge Instructions  Allergies as of 06/03/2018      Reactions   Diclofenac Swelling   AND POSSIBLE SYNCOPE; tolerates ibuprofen per pt   Tramadol Nausea And Vomiting, Nausea Only   Itching (12/21); tolerates ibuprofen per pt   Vicodin [hydrocodone-acetaminophen] Itching, Nausea Only   Hydrocodone Bitartrate Er Itching      Medication List    STOP taking these medications   HYDROcodone-homatropine 5-1.5 MG/5ML syrup Commonly known as:  HYCODAN   oseltamivir 75 MG capsule Commonly known as:  TAMIFLU   predniSONE 10 MG tablet Commonly known as:  DELTASONE     TAKE these medications   albuterol 108 (90 Base) MCG/ACT inhaler Commonly known as:  PROVENTIL HFA;VENTOLIN HFA Inhale 1 puff into the lungs every 6 (six) hours as needed for wheezing or shortness of breath.   aspirin EC 81 MG tablet Take 81 mg by mouth daily.   budesonide-formoterol 80-4.5 MCG/ACT inhaler Commonly known as:  SYMBICORT Inhale 2 puffs into the lungs 2 (two) times daily.   carvedilol 25 MG tablet Commonly known as:  COREG Take 2 tablets (50 mg total) by mouth 2 (two) times daily with a meal.   CELEXA 20 MG tablet Generic drug:  citalopram Take 20 mg by mouth daily.   gabapentin 600 MG tablet Commonly known as:  NEURONTIN Take 600 mg by mouth 3 (three) times daily.   hydrALAZINE 100 MG tablet Commonly known as:  APRESOLINE Take 1 tablet (100 mg total) by mouth 3 (three) times daily.   insulin glargine 100 UNIT/ML injection Commonly known as:  LANTUS Inject 30 Units at bedtime into the skin.   insulin lispro 100 UNIT/ML injection Commonly known as:  HUMALOG Inject 15 Units into the skin 3 (three) times daily before meals.   Ipratropium-Albuterol 20-100 MCG/ACT Aers respimat Commonly known as:  COMBIVENT RESPIMAT Inhale 1 puff into the lungs every 6  (six) hours as needed for wheezing or shortness of breath.   metFORMIN 500 MG tablet Commonly known as:  GLUCOPHAGE Take 500 mg 2 (two) times daily with a meal by mouth.   nitroGLYCERIN 0.4 MG SL tablet Commonly known as:  NITROSTAT Place 1 tablet (0.4 mg total) under the tongue every 5 (five) minutes as needed for chest pain.   omeprazole 40 MG capsule Commonly known as:  PRILOSEC Take 40 mg by mouth daily.   ondansetron 4 MG tablet Commonly known as:  ZOFRAN Take 4 mg by mouth daily as needed for nausea or vomiting.   potassium chloride SA 20 MEQ tablet Commonly known as:  K-DUR,KLOR-CON Take 1 tablet (20 mEq total) by mouth daily.   pyridostigmine 60 MG tablet Commonly known as:  MESTINON Take 60 mg by mouth every 8 (eight) hours.   sacubitril-valsartan 97-103 MG Commonly known as:  ENTRESTO Take 1 tablet by mouth 2 (two) times daily.   spironolactone 50 MG tablet Commonly known as:  ALDACTONE Take 1 tablet (50 mg total) by mouth daily.   torsemide 20 MG tablet Commonly known as:  DEMADEX Please take 40 mg oral every morning and 20 mg every evening. What changed:    how much to take  how to take this  when to take this   traZODone 50 MG tablet Commonly known as:  DESYREL Take 1 tablet (50 mg total) by mouth at bedtime. What changed:    medication strength  how much to take  when to take this       Allergies  Allergen Reactions  .  Diclofenac Swelling    AND POSSIBLE SYNCOPE; tolerates ibuprofen per pt  . Tramadol Nausea And Vomiting and Nausea Only    Itching (12/21); tolerates ibuprofen per pt  . Vicodin [Hydrocodone-Acetaminophen] Itching and Nausea Only  . Hydrocodone Bitartrate Er Itching    Consultations:  cardiology   Procedures/Studies: Dg Chest 2 View  Result Date: 06/02/2018 CLINICAL DATA:  Chest pain EXAM: CHEST - 2 VIEW COMPARISON:  05/12/2018 FINDINGS: Chronic cardiomegaly. There is no edema, consolidation, effusion, or  pneumothorax. Artifact from EKG leads. IMPRESSION: 1. No acute finding. 2. Chronic cardiomegaly Electronically Signed   By: Monte Fantasia M.D.   On: 06/02/2018 04:20   Dg Chest 2 View  Result Date: 05/12/2018 CLINICAL DATA:  Acute onset of cough. EXAM: CHEST - 2 VIEW COMPARISON:  Chest radiograph performed 05/03/2018 FINDINGS: The lungs are well-aerated. Vascular congestion is noted. Mild bibasilar opacities may reflect mild interstitial edema. There is no evidence of pleural effusion or pneumothorax. The heart is mildly enlarged. No acute osseous abnormalities are seen. IMPRESSION: Vascular congestion and mild cardiomegaly. Mild bibasilar opacities may reflect mild interstitial edema. Electronically Signed   By: Garald Balding M.D.   On: 05/12/2018 04:57   Ct Head Wo Contrast  Result Date: 06/02/2018 CLINICAL DATA:  Hypertension with headache EXAM: CT HEAD WITHOUT CONTRAST TECHNIQUE: Contiguous axial images were obtained from the base of the skull through the vertex without intravenous contrast. COMPARISON:  09/29/2017 FINDINGS: Brain: No evidence of acute infarction, hemorrhage, hydrocephalus, extra-axial collection or mass lesion/mass effect. Vascular: No hyperdense vessel or unexpected calcification. Skull: Normal. Negative for fracture or focal lesion. Sinuses/Orbits: Perforated nasal septum seen since at least 2017. IMPRESSION: No acute finding or explanation for headache. Electronically Signed   By: Monte Fantasia M.D.   On: 06/02/2018 07:39        Discharge Exam: Vitals:   06/03/18 0629 06/03/18 0850  BP: 121/77   Pulse: 89   Resp: 20   Temp: 98 F (36.7 C)   SpO2: 100% 98%   Vitals:   06/02/18 2013 06/02/18 2119 06/03/18 0629 06/03/18 0850  BP: (!) 100/58 (!) 106/55 121/77   Pulse: 76 84 89   Resp: 20  20   Temp: 98.4 F (36.9 C)  98 F (36.7 C)   TempSrc: Oral  Oral   SpO2: 100%  100% 98%  Weight:      Height:        General: Pt is alert, awake, not in acute  distress Cardiovascular: RRR, S1/S2 +, no rubs, no gallops Respiratory: CTA bilaterally, no wheezing, no rhonchi Abdominal: Soft, NT, ND, bowel sounds + Extremities: no edema, no cyanosis   The results of significant diagnostics from this hospitalization (including imaging, microbiology, ancillary and laboratory) are listed below for reference.    Significant Diagnostic Studies: Dg Chest 2 View  Result Date: 06/02/2018 CLINICAL DATA:  Chest pain EXAM: CHEST - 2 VIEW COMPARISON:  05/12/2018 FINDINGS: Chronic cardiomegaly. There is no edema, consolidation, effusion, or pneumothorax. Artifact from EKG leads. IMPRESSION: 1. No acute finding. 2. Chronic cardiomegaly Electronically Signed   By: Monte Fantasia M.D.   On: 06/02/2018 04:20   Dg Chest 2 View  Result Date: 05/12/2018 CLINICAL DATA:  Acute onset of cough. EXAM: CHEST - 2 VIEW COMPARISON:  Chest radiograph performed 05/03/2018 FINDINGS: The lungs are well-aerated. Vascular congestion is noted. Mild bibasilar opacities may reflect mild interstitial edema. There is no evidence of pleural effusion or pneumothorax. The heart is mildly enlarged.  No acute osseous abnormalities are seen. IMPRESSION: Vascular congestion and mild cardiomegaly. Mild bibasilar opacities may reflect mild interstitial edema. Electronically Signed   By: Garald Balding M.D.   On: 05/12/2018 04:57   Ct Head Wo Contrast  Result Date: 06/02/2018 CLINICAL DATA:  Hypertension with headache EXAM: CT HEAD WITHOUT CONTRAST TECHNIQUE: Contiguous axial images were obtained from the base of the skull through the vertex without intravenous contrast. COMPARISON:  09/29/2017 FINDINGS: Brain: No evidence of acute infarction, hemorrhage, hydrocephalus, extra-axial collection or mass lesion/mass effect. Vascular: No hyperdense vessel or unexpected calcification. Skull: Normal. Negative for fracture or focal lesion. Sinuses/Orbits: Perforated nasal septum seen since at least 2017.  IMPRESSION: No acute finding or explanation for headache. Electronically Signed   By: Monte Fantasia M.D.   On: 06/02/2018 07:39     Microbiology: No results found for this or any previous visit (from the past 240 hour(s)).   Labs: Basic Metabolic Panel: Recent Labs  Lab 06/02/18 0405 06/03/18 0615  NA 134* 138  K 4.0 3.9  CL 99 102  CO2 25 26  GLUCOSE 317* 258*  BUN 17 23*  CREATININE 0.83 1.05*  CALCIUM 8.9 9.2  MG 1.9  --    Liver Function Tests: Recent Labs  Lab 06/02/18 0405  AST 20  ALT 20  ALKPHOS 65  BILITOT 0.4  PROT 6.6  ALBUMIN 3.3*   No results for input(s): LIPASE, AMYLASE in the last 168 hours. No results for input(s): AMMONIA in the last 168 hours. CBC: Recent Labs  Lab 06/02/18 0405  WBC 8.3  HGB 9.1*  HCT 30.9*  MCV 72.9*  PLT 192   Cardiac Enzymes: Recent Labs  Lab 06/02/18 0927 06/02/18 1223 06/02/18 1445 06/02/18 1809 06/02/18 2143  TROPONINI 0.03* 0.03* 0.03* 0.03* 0.03*   BNP: Invalid input(s): POCBNP CBG: Recent Labs  Lab 06/02/18 0943 06/02/18 1055 06/02/18 1625 06/02/18 2016 06/03/18 0732  GLUCAP 330* 376* 209* 242* 274*    Time coordinating discharge:  36 minutes  Signed:  Orson Eva, DO [facility] Pager: [phone] 06/03/2018, 10:38 AM

## 2018-10-26 ENCOUNTER — Encounter (HOSPITAL_COMMUNITY): Payer: Self-pay | Admitting: Emergency Medicine

## 2018-10-26 ENCOUNTER — Other Ambulatory Visit: Payer: Self-pay

## 2018-10-26 ENCOUNTER — Inpatient Hospital Stay (HOSPITAL_COMMUNITY)
Admission: EM | Admit: 2018-10-26 | Discharge: 2018-10-29 | DRG: 293 | Disposition: A | Discharge status: Home / Self Care | Payer: Medicaid Other | Attending: Internal Medicine | Admitting: Internal Medicine

## 2018-10-26 ENCOUNTER — Observation Stay (HOSPITAL_COMMUNITY): Payer: Medicaid Other

## 2018-10-26 ENCOUNTER — Emergency Department (HOSPITAL_COMMUNITY): Payer: Medicaid Other

## 2018-10-26 DIAGNOSIS — Z794 Long term (current) use of insulin: Secondary | ICD-10-CM

## 2018-10-26 DIAGNOSIS — Z6838 Body mass index (BMI) 38.0-38.9, adult: Secondary | ICD-10-CM

## 2018-10-26 DIAGNOSIS — Z20828 Contact with and (suspected) exposure to other viral communicable diseases: Secondary | ICD-10-CM | POA: Diagnosis present

## 2018-10-26 DIAGNOSIS — T43225A Adverse effect of selective serotonin reuptake inhibitors, initial encounter: Secondary | ICD-10-CM | POA: Diagnosis not present

## 2018-10-26 DIAGNOSIS — Z8249 Family history of ischemic heart disease and other diseases of the circulatory system: Secondary | ICD-10-CM

## 2018-10-26 DIAGNOSIS — Z888 Allergy status to other drugs, medicaments and biological substances status: Secondary | ICD-10-CM

## 2018-10-26 DIAGNOSIS — R2242 Localized swelling, mass and lump, left lower limb: Secondary | ICD-10-CM

## 2018-10-26 DIAGNOSIS — F32A Depression, unspecified: Secondary | ICD-10-CM | POA: Diagnosis present

## 2018-10-26 DIAGNOSIS — I110 Hypertensive heart disease with heart failure: Principal | ICD-10-CM | POA: Diagnosis present

## 2018-10-26 DIAGNOSIS — K219 Gastro-esophageal reflux disease without esophagitis: Secondary | ICD-10-CM | POA: Diagnosis present

## 2018-10-26 DIAGNOSIS — I5043 Acute on chronic combined systolic (congestive) and diastolic (congestive) heart failure: Secondary | ICD-10-CM | POA: Diagnosis not present

## 2018-10-26 DIAGNOSIS — E119 Type 2 diabetes mellitus without complications: Secondary | ICD-10-CM

## 2018-10-26 DIAGNOSIS — R9431 Abnormal electrocardiogram [ECG] [EKG]: Secondary | ICD-10-CM | POA: Diagnosis not present

## 2018-10-26 DIAGNOSIS — Z79899 Other long term (current) drug therapy: Secondary | ICD-10-CM

## 2018-10-26 DIAGNOSIS — Z7982 Long term (current) use of aspirin: Secondary | ICD-10-CM

## 2018-10-26 DIAGNOSIS — Z833 Family history of diabetes mellitus: Secondary | ICD-10-CM

## 2018-10-26 DIAGNOSIS — F419 Anxiety disorder, unspecified: Secondary | ICD-10-CM | POA: Diagnosis present

## 2018-10-26 DIAGNOSIS — I10 Essential (primary) hypertension: Secondary | ICD-10-CM | POA: Diagnosis present

## 2018-10-26 DIAGNOSIS — Y92230 Patient room in hospital as the place of occurrence of the external cause: Secondary | ICD-10-CM | POA: Diagnosis not present

## 2018-10-26 DIAGNOSIS — E876 Hypokalemia: Secondary | ICD-10-CM | POA: Diagnosis present

## 2018-10-26 DIAGNOSIS — I509 Heart failure, unspecified: Secondary | ICD-10-CM

## 2018-10-26 DIAGNOSIS — Z9049 Acquired absence of other specified parts of digestive tract: Secondary | ICD-10-CM

## 2018-10-26 DIAGNOSIS — G4733 Obstructive sleep apnea (adult) (pediatric): Secondary | ICD-10-CM | POA: Diagnosis present

## 2018-10-26 DIAGNOSIS — M1710 Unilateral primary osteoarthritis, unspecified knee: Secondary | ICD-10-CM | POA: Diagnosis present

## 2018-10-26 DIAGNOSIS — E785 Hyperlipidemia, unspecified: Secondary | ICD-10-CM | POA: Diagnosis present

## 2018-10-26 DIAGNOSIS — Z885 Allergy status to narcotic agent status: Secondary | ICD-10-CM

## 2018-10-26 DIAGNOSIS — Z7951 Long term (current) use of inhaled steroids: Secondary | ICD-10-CM

## 2018-10-26 DIAGNOSIS — Z8349 Family history of other endocrine, nutritional and metabolic diseases: Secondary | ICD-10-CM

## 2018-10-26 DIAGNOSIS — I429 Cardiomyopathy, unspecified: Secondary | ICD-10-CM | POA: Diagnosis present

## 2018-10-26 DIAGNOSIS — F329 Major depressive disorder, single episode, unspecified: Secondary | ICD-10-CM | POA: Diagnosis present

## 2018-10-26 DIAGNOSIS — R609 Edema, unspecified: Secondary | ICD-10-CM

## 2018-10-26 DIAGNOSIS — E669 Obesity, unspecified: Secondary | ICD-10-CM

## 2018-10-26 DIAGNOSIS — E66812 Obesity, class 2: Secondary | ICD-10-CM

## 2018-10-26 LAB — CBC WITH DIFFERENTIAL/PLATELET
Abs Immature Granulocytes: 0.05 10*3/uL (ref 0.00–0.07)
Basophils Absolute: 0 10*3/uL (ref 0.0–0.1)
Basophils Relative: 1 %
Eosinophils Absolute: 0.1 10*3/uL (ref 0.0–0.5)
Eosinophils Relative: 1 %
HCT: 32.5 % — ABNORMAL LOW (ref 36.0–46.0)
Hemoglobin: 9.7 g/dL — ABNORMAL LOW (ref 12.0–15.0)
Immature Granulocytes: 1 %
Lymphocytes Relative: 17 %
Lymphs Abs: 1.4 10*3/uL (ref 0.7–4.0)
MCH: 21.5 pg — ABNORMAL LOW (ref 26.0–34.0)
MCHC: 29.8 g/dL — ABNORMAL LOW (ref 30.0–36.0)
MCV: 71.9 fL — ABNORMAL LOW (ref 80.0–100.0)
Monocytes Absolute: 0.4 10*3/uL (ref 0.1–1.0)
Monocytes Relative: 5 %
Neutro Abs: 6.4 10*3/uL (ref 1.7–7.7)
Neutrophils Relative %: 75 %
Platelets: 245 10*3/uL (ref 150–400)
RBC: 4.52 MIL/uL (ref 3.87–5.11)
RDW: 17.3 % — ABNORMAL HIGH (ref 11.5–15.5)
WBC: 8.4 10*3/uL (ref 4.0–10.5)
nRBC: 0 % (ref 0.0–0.2)

## 2018-10-26 LAB — URINALYSIS, ROUTINE W REFLEX MICROSCOPIC
Bilirubin Urine: NEGATIVE
Glucose, UA: NEGATIVE mg/dL
Hgb urine dipstick: NEGATIVE
Ketones, ur: NEGATIVE mg/dL
Leukocytes,Ua: NEGATIVE
Nitrite: NEGATIVE
Protein, ur: NEGATIVE mg/dL
Specific Gravity, Urine: 1.005 (ref 1.005–1.030)
pH: 6 (ref 5.0–8.0)

## 2018-10-26 LAB — GLUCOSE, CAPILLARY
Glucose-Capillary: 153 mg/dL — ABNORMAL HIGH (ref 70–99)
Glucose-Capillary: 237 mg/dL — ABNORMAL HIGH (ref 70–99)

## 2018-10-26 LAB — COMPREHENSIVE METABOLIC PANEL WITH GFR
ALT: 14 U/L (ref 0–44)
AST: 21 U/L (ref 15–41)
Albumin: 3.6 g/dL (ref 3.5–5.0)
Alkaline Phosphatase: 79 U/L (ref 38–126)
Anion gap: 10 (ref 5–15)
BUN: 12 mg/dL (ref 6–20)
CO2: 27 mmol/L (ref 22–32)
Calcium: 9.1 mg/dL (ref 8.9–10.3)
Chloride: 102 mmol/L (ref 98–111)
Creatinine, Ser: 1.14 mg/dL — ABNORMAL HIGH (ref 0.44–1.00)
GFR calc Af Amer: >60 mL/min
GFR calc non Af Amer: 59 mL/min — ABNORMAL LOW
Glucose, Bld: 241 mg/dL — ABNORMAL HIGH (ref 70–99)
Potassium: 3.1 mmol/L — ABNORMAL LOW (ref 3.5–5.1)
Sodium: 139 mmol/L (ref 135–145)
Total Bilirubin: 0.5 mg/dL (ref 0.3–1.2)
Total Protein: 7.4 g/dL (ref 6.5–8.1)

## 2018-10-26 LAB — TROPONIN I
Troponin I: 0.04 ng/mL
Troponin I: 0.04 ng/mL

## 2018-10-26 LAB — MAGNESIUM: Magnesium: 1.5 mg/dL — ABNORMAL LOW (ref 1.7–2.4)

## 2018-10-26 LAB — BRAIN NATRIURETIC PEPTIDE: B Natriuretic Peptide: 778 pg/mL — ABNORMAL HIGH (ref 0.0–100.0)

## 2018-10-26 LAB — SARS CORONAVIRUS 2 BY RT PCR (HOSPITAL ORDER, PERFORMED IN ~~LOC~~ HOSPITAL LAB): SARS Coronavirus 2: NEGATIVE

## 2018-10-26 MED ORDER — INSULIN ASPART 100 UNIT/ML ~~LOC~~ SOLN
0.0000 [IU] | Freq: Three times a day (TID) | SUBCUTANEOUS | Status: DC
  Administered 2018-10-27: 11 [IU] via SUBCUTANEOUS
  Administered 2018-10-27: 5 [IU] via SUBCUTANEOUS
  Administered 2018-10-27: 3 [IU] via SUBCUTANEOUS
  Administered 2018-10-28 – 2018-10-29 (×4): 5 [IU] via SUBCUTANEOUS

## 2018-10-26 MED ORDER — ACETAMINOPHEN 325 MG PO TABS
650.0000 mg | ORAL_TABLET | Freq: Four times a day (QID) | ORAL | Status: DC | PRN
  Administered 2018-10-27: 650 mg via ORAL
  Filled 2018-10-26: qty 2

## 2018-10-26 MED ORDER — FUROSEMIDE 10 MG/ML IJ SOLN
40.0000 mg | Freq: Once | INTRAMUSCULAR | Status: AC
  Administered 2018-10-26: 40 mg via INTRAVENOUS
  Filled 2018-10-26: qty 4

## 2018-10-26 MED ORDER — SACUBITRIL-VALSARTAN 97-103 MG PO TABS
1.0000 | ORAL_TABLET | Freq: Two times a day (BID) | ORAL | Status: DC
  Administered 2018-10-26 – 2018-10-29 (×6): 1 via ORAL
  Filled 2018-10-26 (×8): qty 1

## 2018-10-26 MED ORDER — CITALOPRAM HYDROBROMIDE 20 MG PO TABS
20.0000 mg | ORAL_TABLET | Freq: Every day | ORAL | Status: DC
  Administered 2018-10-27 – 2018-10-29 (×3): 20 mg via ORAL
  Filled 2018-10-26 (×4): qty 1

## 2018-10-26 MED ORDER — TRAZODONE HCL 50 MG PO TABS
50.0000 mg | ORAL_TABLET | Freq: Every day | ORAL | Status: DC
  Administered 2018-10-26: 50 mg via ORAL
  Filled 2018-10-26 (×2): qty 1

## 2018-10-26 MED ORDER — ASPIRIN EC 81 MG PO TBEC
81.0000 mg | DELAYED_RELEASE_TABLET | Freq: Every day | ORAL | Status: DC
  Administered 2018-10-27 – 2018-10-29 (×3): 81 mg via ORAL
  Filled 2018-10-26 (×4): qty 1

## 2018-10-26 MED ORDER — INSULIN ASPART 100 UNIT/ML ~~LOC~~ SOLN: 0.0000 [IU] | Freq: Three times a day (TID) | SUBCUTANEOUS | Status: DC

## 2018-10-26 MED ORDER — CARVEDILOL 12.5 MG PO TABS
50.0000 mg | ORAL_TABLET | Freq: Two times a day (BID) | ORAL | Status: DC
  Administered 2018-10-26 – 2018-10-29 (×6): 50 mg via ORAL
  Filled 2018-10-26 (×6): qty 4

## 2018-10-26 MED ORDER — ENOXAPARIN SODIUM 60 MG/0.6ML ~~LOC~~ SOLN
50.0000 mg | SUBCUTANEOUS | Status: DC
  Administered 2018-10-26 – 2018-10-28 (×3): 50 mg via SUBCUTANEOUS
  Filled 2018-10-26 (×3): qty 0.6

## 2018-10-26 MED ORDER — ONDANSETRON HCL 4 MG PO TABS: 4.0000 mg | ORAL_TABLET | Freq: Four times a day (QID) | ORAL | Status: DC | PRN

## 2018-10-26 MED ORDER — LABETALOL HCL 5 MG/ML IV SOLN
10.0000 mg | INTRAVENOUS | Status: DC | PRN
  Administered 2018-10-26 (×2): 10 mg via INTRAVENOUS
  Filled 2018-10-26 (×2): qty 4

## 2018-10-26 MED ORDER — FUROSEMIDE 10 MG/ML IJ SOLN
60.0000 mg | Freq: Two times a day (BID) | INTRAMUSCULAR | Status: DC
  Administered 2018-10-27: 60 mg via INTRAVENOUS
  Filled 2018-10-26: qty 6

## 2018-10-26 MED ORDER — FUROSEMIDE 10 MG/ML IJ SOLN
20.0000 mg | Freq: Once | INTRAMUSCULAR | Status: DC
  Filled 2018-10-26: qty 2

## 2018-10-26 MED ORDER — ONDANSETRON HCL 4 MG/2ML IJ SOLN: 4.0000 mg | Freq: Four times a day (QID) | INTRAMUSCULAR | Status: DC | PRN

## 2018-10-26 MED ORDER — INSULIN GLARGINE 100 UNIT/ML ~~LOC~~ SOLN
20.0000 [IU] | Freq: Every day | SUBCUTANEOUS | Status: DC
  Administered 2018-10-26: 20 [IU] via SUBCUTANEOUS
  Filled 2018-10-26 (×4): qty 0.2

## 2018-10-26 MED ORDER — ONDANSETRON HCL 4 MG/2ML IJ SOLN
4.0000 mg | Freq: Once | INTRAMUSCULAR | Status: AC
  Administered 2018-10-26: 4 mg via INTRAVENOUS
  Filled 2018-10-26: qty 2

## 2018-10-26 MED ORDER — POTASSIUM CHLORIDE CRYS ER 20 MEQ PO TBCR
20.0000 meq | EXTENDED_RELEASE_TABLET | Freq: Every day | ORAL | Status: DC
  Administered 2018-10-27 – 2018-10-29 (×3): 20 meq via ORAL
  Filled 2018-10-26 (×3): qty 1

## 2018-10-26 MED ORDER — POTASSIUM CHLORIDE CRYS ER 20 MEQ PO TBCR
40.0000 meq | EXTENDED_RELEASE_TABLET | ORAL | Status: AC
  Administered 2018-10-26 (×2): 40 meq via ORAL
  Filled 2018-10-26 (×3): qty 2

## 2018-10-26 MED ORDER — POLYETHYLENE GLYCOL 3350 17 G PO PACK: 17.0000 g | PACK | Freq: Every day | ORAL | Status: DC | PRN

## 2018-10-26 MED ORDER — POTASSIUM CHLORIDE 10 MEQ/100ML IV SOLN
10.0000 meq | Freq: Once | INTRAVENOUS | Status: AC
  Administered 2018-10-26: 10 meq via INTRAVENOUS
  Filled 2018-10-26: qty 100

## 2018-10-26 MED ORDER — ACETAMINOPHEN 325 MG PO TABS
650.0000 mg | ORAL_TABLET | Freq: Once | ORAL | Status: AC
  Administered 2018-10-26: 650 mg via ORAL
  Filled 2018-10-26: qty 2

## 2018-10-26 MED ORDER — HYDRALAZINE HCL 25 MG PO TABS
100.0000 mg | ORAL_TABLET | Freq: Three times a day (TID) | ORAL | Status: DC
  Administered 2018-10-26 – 2018-10-29 (×8): 100 mg via ORAL
  Filled 2018-10-26 (×9): qty 4

## 2018-10-26 MED ORDER — ATORVASTATIN CALCIUM 20 MG PO TABS
20.0000 mg | ORAL_TABLET | Freq: Every day | ORAL | Status: DC
  Administered 2018-10-26 – 2018-10-28 (×3): 20 mg via ORAL
  Filled 2018-10-26 (×3): qty 1

## 2018-10-26 MED ORDER — ENOXAPARIN SODIUM 40 MG/0.4ML ~~LOC~~ SOLN: 40.0000 mg | SUBCUTANEOUS | Status: DC

## 2018-10-26 MED ORDER — FUROSEMIDE 10 MG/ML IJ SOLN: 40.0000 mg | Freq: Two times a day (BID) | INTRAMUSCULAR | Status: DC

## 2018-10-26 MED ORDER — MAGNESIUM SULFATE 2 GM/50ML IV SOLN
2.0000 g | Freq: Once | INTRAVENOUS | Status: AC
  Administered 2018-10-26: 2 g via INTRAVENOUS
  Filled 2018-10-26: qty 50

## 2018-10-26 MED ORDER — GABAPENTIN 300 MG PO CAPS
600.0000 mg | ORAL_CAPSULE | Freq: Three times a day (TID) | ORAL | Status: DC
  Administered 2018-10-26 – 2018-10-29 (×8): 600 mg via ORAL
  Filled 2018-10-26 (×9): qty 2

## 2018-10-26 MED ORDER — ACETAMINOPHEN 650 MG RE SUPP: 650.0000 mg | Freq: Four times a day (QID) | RECTAL | Status: DC | PRN

## 2018-10-26 MED ORDER — PANTOPRAZOLE SODIUM 40 MG PO TBEC
40.0000 mg | DELAYED_RELEASE_TABLET | Freq: Every day | ORAL | Status: DC
  Administered 2018-10-27 – 2018-10-29 (×3): 40 mg via ORAL
  Filled 2018-10-26 (×4): qty 1

## 2018-10-26 NOTE — ED Notes (Signed)
CRITICAL VALUE ALERT  Critical Value:  Troponin 0.04  Date & Time Notied:  10/26/2018, 1327  Provider Notified: Irene Pap PA  Orders Received/Actions taken: see chart

## 2018-10-26 NOTE — ED Triage Notes (Signed)
Pt c/o of sob x 1 week with CHF

## 2018-10-26 NOTE — ED Provider Notes (Signed)
[facility] Provider Note   CSN: [id number] Arrival date & time: 10/26/18  1202    History   Chief Complaint Chief Complaint  Patient presents with  . Shortness of Breath    HPI JANE BIRKEL is a 43 y.o. female.     43 y.o female with a PMH of Anemia, DM, HTN, CHF presents to the with a chief complaint of shortness of breath x 1 week. Patient states she has been feeling short of breath along with weaker for the past 2 weeks. She states on Saturday, she noted both of her lower extremities were swollen, therefore she had to keep them elevated for relieve. She currently takes 60 mg daily of lasix, has been taking 40 mg in the morning along with 20 mg in the evening, she has been increasing her dose to 80 mg daily in the past few days. She also reports feeling that her left lower extremity has been more swollen than usual and it feels sore. She also endorses a productive cough which began a couple of days ago. Her symptoms are exacerbated by walking and relieve while at rest. She reports compliance with medication with her last admission in December due to exacerbation of chronic heart failure. She denies any fever, chest pain or previous history of blood clots.  Information obtained from her chart reveals: -12/22/2015 heart catheterization--no significant CAD .  The history is provided by the patient and medical records.    Past Medical History:  Diagnosis Date  . Anemia    H&H of 10.6/33 and 07/2008 and 11.9/35 and 09/2010  . Anxiety   . CHF (congestive heart failure) ([place])    a. EF 40-45% by echo in 12/2015 with cath showing normal cors  . Depression with anxiety   . Diabetes mellitus without complication ([place])   . Enlarged heart   . Hypertension   . Hypertensive heart disease 2009   Pulmonary edema postpartum; mild to moderate mitral regurgitation when hospitalized for CHF in 2009; Echocardiogram in 12/2009-no MR and normal EF; normal CXR in 09/2010  .  Migraine headache   . Miscarriage 03/19/2013  . Obesity 04/16/2009  . Osteoarthritis, knee 03/29/2011  . Preeclampsia   . Pregnant   . Pulmonary edema   . Sleep apnea   . Threatened abortion in early pregnancy 03/15/2013    Patient Active Problem List   Diagnosis Date Noted  . Chronic combined systolic and diastolic CHF (congestive heart failure) ([place]) 06/02/2018  . Uncontrolled type 2 diabetes mellitus with hyperglycemia ([place]) 06/02/2018  . Influenza B 05/13/2018  . Hypomagnesemia 05/12/2018  . Headache 05/12/2018  . Upper respiratory tract infection   . HCAP (healthcare-associated pneumonia)   . Constipation 10/17/2017  . Bad headache   . CHF exacerbation ([place]) 09/29/2017  . Iron deficiency anemia 05/16/2017  . Vitamin D deficiency 11/25/2016  . Iron deficiency 11/25/2016  . History of acute myocardial infarction 10/05/2016  . CHF (congestive heart failure) ([place]) 05/06/2016  . Diabetes mellitus with complication ([place]) 49/70/2637  . Leukocytosis 05/06/2016  . Neuropathy 05/06/2016  . Depression 04/16/2016  . Chronic tension-type headache, not intractable 04/16/2016  . AKI (acute kidney injury) ([place])   . Hyperkalemia   . Nonischemic cardiomyopathy ([place])   . Acute on chronic combined systolic and diastolic ACC/AHA stage C congestive heart failure ([place]) 04/03/2016  . Acute on chronic combined systolic and diastolic CHF, NYHA class 4 ([place]) 04/03/2016  . Hypertensive emergency 04/03/2016  . Cardiomyopathy due to  hypertension ([place]) 12/22/2015  . Normal coronary arteries 12/22/2015  . Troponin level elevated 12/22/2015  . NSTEMI (non-ST elevated myocardial infarction) ([place]) 12/20/2015  . Dental infection 10/10/2015  . Chest pain 09/05/2015  . Systolic CHF, chronic ([place]) 09/05/2015  . LLQ pain   . Type 2 diabetes mellitus without complication ([place]) 09/73/5329  . Essential hypertension   . 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 Hypertensive urgency 04/22/2014  . Acute CHF ([place])  04/22/2014  . S/P cesarean section 04/11/2014  . Acute pulmonary edema ([place]) 04/11/2014  . Postoperative anemia 04/11/2014  . Elevated serum creatinine 04/11/2014  . Preeclampsia, severe 04/09/2014  . Pre-eclampsia superimposed on chronic hypertension, antepartum 04/08/2014  . Dyspnea 04/08/2014  . Polyhydramnios in third trimester, antepartum 03/14/2014  . Abnormal maternal glucose tolerance, antepartum 03/11/2014  . High-risk pregnancy 03/11/2014  . Pre-existing essential hypertension complicating pregnancy 92/42/6834  . Impaired glucose tolerance during pregnancy, antepartum 11/27/2013  . Leiomyoma of uterus 11/22/2013  . History of gestational diabetes in prior pregnancy, currently pregnant in first trimester 11/22/2013  . Hx of preeclampsia, prior pregnancy, currently pregnant 11/22/2013  . Short interval between pregnancies affecting pregnancy, antepartum 11/22/2013  . Supervision of high-risk pregnancy of elderly primigravida (>= 25 years old at delivery), third trimester 11/22/2013  . Miscarriage 03/19/2013  . Major depressive disorder, single episode, unspecified 09/27/2011  . Hypertension   . Hypertensive cardiovascular disease   . Microcytic anemia   . Osteoarthrosis involving lower leg 03/29/2011  . Hypokalemia 12/12/2009  . OSA on CPAP 12/09/2009  . Morbid obesity ([place]) 04/16/2009    Past Surgical History:  Procedure Laterality Date  . BREAST REDUCTION SURGERY  2002  . CARDIAC CATHETERIZATION N/A 12/22/2015   Procedure: Left Heart Cath and Coronary Angiography;  Surgeon: Peter M Martinique, MD;  Location: [facility];  Service: Cardiovascular;  Laterality: N/A;  . CESAREAN SECTION N/A 04/09/2014   Procedure: CESAREAN SECTION;  Surgeon: Mora Bellman, MD;  Location: [facility];  Service: Obstetrics;  Laterality: N/A;  . CHOLECYSTECTOMY       OB History    Gravida  11   Para  6   Term  5   Preterm  1   AB  5   Living  6     SAB  3   TAB  2   Ectopic       Multiple  0   Live Births  6            Home Medications    Prior to Admission medications   Medication Sig Start Date End Date Taking? Authorizing Provider  albuterol (PROVENTIL HFA;VENTOLIN HFA) 108 (90 Base) MCG/ACT inhaler Inhale 1 puff into the lungs every 6 (six) hours as needed for wheezing or shortness of breath.   Yes [provider]  aspirin EC 81 MG tablet Take 81 mg by mouth daily.   Yes [provider]  atorvastatin (LIPITOR) 20 MG tablet Take 1 tablet (20 mg total) by mouth daily at 6 PM. 06/03/18  Yes Tat, Shanon Brow, MD  budesonide-formoterol (SYMBICORT) 80-4.5 MCG/ACT inhaler Inhale 2 puffs into the lungs 2 (two) times daily. 05/04/18  Yes Shah, Pratik D, DO  carvedilol (COREG) 25 MG tablet Take 2 tablets (50 mg total) by mouth 2 (two) times daily with a meal. 06/03/18  Yes Tat, David, MD  citalopram (CELEXA) 20 MG tablet Take 20 mg by mouth daily. 08/19/17  Yes [provider]  gabapentin (NEURONTIN) 600 MG tablet Take 600 mg  by mouth 3 (three) times daily.    Yes [provider]  hydrALAZINE (APRESOLINE) 100 MG tablet Take 1 tablet (100 mg total) by mouth 3 (three) times daily. 06/03/18  Yes Tat, Shanon Brow, MD  insulin glargine (LANTUS) 100 UNIT/ML injection Inject 30 Units at bedtime into the skin.    Yes [provider]  insulin lispro (HUMALOG) 100 UNIT/ML injection Inject 15 Units into the skin 3 (three) times daily before meals.    Yes [provider]  metFORMIN (GLUCOPHAGE) 500 MG tablet Take 500 mg 2 (two) times daily with a meal by mouth.   Yes [provider]  nitroGLYCERIN (NITROSTAT) 0.4 MG SL tablet Place 1 tablet (0.4 mg total) under the tongue every 5 (five) minutes as needed for chest pain. 09/05/15  Yes Kathie Dike, MD  omeprazole (PRILOSEC) 40 MG capsule Take 40 mg by mouth daily.   Yes [provider]  ondansetron (ZOFRAN) 4 MG tablet Take 4 mg by mouth daily as needed for nausea or  vomiting.   Yes [provider]  potassium chloride SA (K-DUR,KLOR-CON) 20 MEQ tablet Take 1 tablet (20 mEq total) by mouth daily. 05/12/17  Yes Holley Bouche, NP  pyridostigmine (MESTINON) 60 MG tablet Take 60 mg by mouth every 8 (eight) hours.   Yes [provider]  sacubitril-valsartan (ENTRESTO) 97-103 MG Take 1 tablet by mouth 2 (two) times daily. 04/09/16  Yes Arrien, Jimmy Picket, MD  torsemide (DEMADEX) 20 MG tablet Please take 40 mg oral every morning and 20 mg every evening. Patient taking differently: Take 20-40 mg 2 (two) times daily by mouth. Please take 40 mg oral every morning and 20 mg every evening. 05/10/16  Yes Elgergawy, Silver Huguenin, MD  traZODone (DESYREL) 50 MG tablet Take 1 tablet (50 mg total) by mouth at bedtime. 06/03/18  Yes Tat, Shanon Brow, MD  Ipratropium-Albuterol (COMBIVENT RESPIMAT) 20-100 MCG/ACT AERS respimat Inhale 1 puff into the lungs every 6 (six) hours as needed for wheezing or shortness of breath. 05/04/18   Manuella Ghazi, Pratik D, DO  spironolactone (ALDACTONE) 50 MG tablet Take 1 tablet (50 mg total) by mouth daily. 09/30/17 05/12/18  Lorella Nimrod, MD    Family History Family History  Problem Relation Age of Onset  . Diabetes Mother 40  . Heart disease Mother   . Hyperlipidemia Paternal Grandfather   . Hypertension Paternal Grandfather   . Heart disease Father   . Hypertension Father   . Heart disease Maternal Grandmother 60  . ADD / ADHD Son   . Hypertension Maternal Uncle   . Heart attack Brother   . Sudden death Neg Hx   . Colon cancer Neg Hx   . Celiac disease Neg Hx   . Inflammatory bowel disease Neg Hx     Social History Social History   Tobacco Use  . Smoking status: Never Smoker  . Smokeless tobacco: Never Used  Substance Use Topics  . Alcohol use: Yes    Comment: occ  . Drug use: No     Allergies   Diclofenac, Tramadol, Vicodin [hydrocodone-acetaminophen], and Hydrocodone bitartrate er   Review of Systems  Review of Systems  Constitutional: Negative for chills and fever.  HENT: Negative for ear pain and sore throat.   Eyes: Negative for pain and visual disturbance.  Respiratory: Positive for cough and shortness of breath.   Cardiovascular: Positive for leg swelling. Negative for chest pain and palpitations.  Gastrointestinal: Negative for abdominal pain, nausea and vomiting.  Genitourinary:  Negative for dysuria and hematuria.  Musculoskeletal: Negative for arthralgias and back pain.  Skin: Negative for color change and rash.  Neurological: Negative for seizures and syncope.  All other systems reviewed and are negative.    Physical Exam Updated Vital Signs BP (!) 154/94   Pulse 89   Temp 98 F (36.7 C) (Oral)   Resp 20   Ht 5\' 6"  (1.676 m)   Wt 108.9 kg   SpO2 96%   BMI 38.74 kg/m   Physical Exam Vitals signs and nursing note reviewed.  Constitutional:      General: She is not in acute distress.    Appearance: She is well-developed. She is obese. She is ill-appearing.  HENT:     Head: Normocephalic and atraumatic.     Mouth/Throat:     Pharynx: No oropharyngeal exudate.  Eyes:     Pupils: Pupils are equal, round, and reactive to light.  Neck:     Musculoskeletal: Normal range of motion.  Cardiovascular:     Rate and Rhythm: Normal rate and regular rhythm.     Heart sounds: Normal heart sounds.     Comments: Edema more prominent on left > right leg. Pulmonary:     Effort: Pulmonary effort is normal. No respiratory distress.     Breath sounds: Decreased breath sounds and rales present.  Chest:     Chest wall: No tenderness.  Abdominal:     General: Bowel sounds are normal. There is no distension.     Palpations: Abdomen is soft.     Tenderness: There is no abdominal tenderness.  Musculoskeletal:        General: No tenderness or deformity.     Right lower leg: 1+ Pitting Edema present.     Left lower leg: 1+ Pitting Edema present.  Skin:    General: Skin is warm  and dry.  Neurological:     Mental Status: She is alert and oriented to person, place, and time.      ED Treatments / Results  Labs (all labs ordered are listed, but only abnormal results are displayed) Labs Reviewed  BRAIN NATRIURETIC PEPTIDE - Abnormal; Notable for the following components:      Result Value   B Natriuretic Peptide 778.0 (*)    All other components within normal limits  COMPREHENSIVE METABOLIC PANEL - Abnormal; Notable for the following components:   Potassium 3.1 (*)    Glucose, Bld 241 (*)    Creatinine, Ser 1.14 (*)    GFR calc non Af Amer 59 (*)    All other components within normal limits  CBC WITH DIFFERENTIAL/PLATELET - Abnormal; Notable for the following components:   Hemoglobin 9.7 (*)    HCT 32.5 (*)    MCV 71.9 (*)    MCH 21.5 (*)    MCHC 29.8 (*)    RDW 17.3 (*)    All other components within normal limits  TROPONIN I - Abnormal; Notable for the following components:   Troponin I 0.04 (*)    All other components within normal limits  URINALYSIS, ROUTINE W REFLEX MICROSCOPIC - Abnormal; Notable for the following components:   Color, Urine COLORLESS (*)    All other components within normal limits  SARS CORONAVIRUS 2 (HOSPITAL ORDER, [facility])    EKG EKG Interpretation  Date/Time:  Thursday October 26 2018 12:12:12 EDT Ventricular Rate:  88 PR Interval:  162 QRS Duration: 96 QT Interval:  426 QTC Calculation: 515  R Axis:   -31 Text Interpretation:  Normal sinus rhythm Possible Left atrial enlargement Left axis deviation Left ventricular hypertrophy with repolarization abnormality Prolonged QT Abnormal ECG Confirmed by Nat Christen [phone]) on 10/26/2018 12:36:17 PM   Radiology Dg Chest 2 View  Result Date: 10/26/2018 CLINICAL DATA:  Shortness of breath.  Swelling in legs. EXAM: CHEST - 2 VIEW COMPARISON:  06/02/2018. FINDINGS: The heart is enlarged. There is mild vascular congestion. No consolidation or edema.  No effusion or pneumothorax. Bones unremarkable. Similar appearance to priors. IMPRESSION: Cardiomegaly. Mild vascular congestion. No frank pulmonary edema or consolidation. Electronically Signed   By: Staci Righter M.D.   On: 10/26/2018 14:40    Procedures Procedures (including critical care time)  Medications Ordered in ED Medications  potassium chloride 10 mEq in 100 mL IVPB (10 mEq Intravenous New Bag/Given 10/26/18 1513)  acetaminophen (TYLENOL) tablet 650 mg (650 mg Oral Given 10/26/18 1417)  ondansetron (ZOFRAN) injection 4 mg (4 mg Intravenous Given 10/26/18 1417)  furosemide (LASIX) injection 40 mg (40 mg Intravenous Given 10/26/18 1509)     Initial Impression / Assessment and Plan / ED Course  I have reviewed the triage vital signs and the nursing notes.  Pertinent labs & imaging results that were available during my care of the patient were reviewed by me and considered in my medical decision making (see chart for details).       Patient with a PMH of CHF, HTN, DM presents to the ED with a worsening SOB x 1 week. Currently on a 60 mg dose of lasix daily but has increased this to 80 mg in the last couple of days. States the SOB is worse with ambulation. Also endorses BLLE swelling worse as the day progresses. No chest pain, fever, does endorse a productive cough.  During evaluation patient appears ill, she states she's feeling weaker and has an increase lack of energy, lungs are diminished along with rales present at the bases BL. Will obtain laboratory results along with ekg and chest xray to further evaluate. I have reviewed her medical records extensively and patient last admission was on December 31 due to exacerbation of heart failure.   BMP was significant for hypokalemia, will replace this via IV.  Creatinine level slightly increased on today's visit, this is worsened for patient's previous visit.  No anion gap.  CBC showed no leukocytosis, hemoglobin is slightly decreased but  stable.  Troponin was positive at 0.04, this is consistent with patient's previous visits but they have been elevated at 0.03.  UA showed no nitrates, leukocytes no white blood cell count.  During evaluation patient has rails to the base of her lungs, does have bilateral lower extremity edema.  Reports worsening dyspnea with walking.  Chest x-ray showed; Cardiomegaly. Mild vascular congestion. No frank pulmonary edema or  consolidation.     Patient has been given Tylenol, Zofran as she reports nausea with Tylenol.  She is also been given 40 mg of Lasix along with potassium replacement via IV.  At this time due to patient's worsening shortness of breath along with bilateral pitting edema I feel patient is requiring admission.  Will place call for hospitalist at this time.  3:30 PM Spoke to hospitalist who will admit patient at this time for acute exacerbation of chronic heart failure.   Portions of this note were generated with [employer]. Dictation errors may occur despite best attempts at proofreading.    Final Clinical Impressions(s) / ED Diagnoses  Final diagnoses:  Acute on chronic congestive heart failure, unspecified heart failure type [facility])    ED Discharge Orders    None       Janeece Fitting, PA-C 10/26/18 [place]    Nat Christen, MD 10/27/18 [phone]

## 2018-10-26 NOTE — H&P (Addendum)
History and Physical    Chelsey Santiago IEP:[id number] DOB: 13-Oct-1975 DOA: 10/26/2018  PCP: Rosita Fire, MD   Patient coming from: Home  [name]  Chief Complaint: SOB, leg swelling  HPI: Chelsey Santiago is a 43 y.o. female with medical history significant for systolic and diastolic CHF, diabetes mellitus, depression, hypertension, obstructive sleep apnea, presented to the ED with complaints of difficulty breathing of 1 week duration, generalized weakness and bilateral lower extremity swelling left worse than right over the past 3 weeks. Difficulty breathing is worse with exertion. She endorses stable two-pillow orthopnea.  She reports onset of whitish productive cough today.  No Fever no chills.  She feels she is accumulating fluid, abdomen is bloated from fluids. She has increased her daily total torsemide dose from 60mg  to 80 mg without improvement in symptoms. She reports compliance with a low-salt diet, compliance with her Lasix and carvedilol.  She denies chest pain.  Follows with cardiology but has not followed up with cardiology since December.  She reports her stable weight is about 230 to 235 Lbs.  ED Course: Blood pressure systolic 660Y to 301S, O2 sats greater than 94% on room air.  Two-view chest x-ray mild vascular congestion, no frank pulmonary edema or consolidation.  EKG, T wave changes in 1 and aVL.  Troponin chronically elevated 0.04.  Potassium 3.1.  BNP 778.  Patient was given 40 mg IV Lasix.  Hospitalist to admit for decompensated CHF.  Review of Systems: As per HPI all other systems reviewed and negative.  Past Medical History:  Diagnosis Date  . Anemia    H&H of 10.6/33 and 07/2008 and 11.9/35 and 09/2010  . Anxiety   . CHF (congestive heart failure) ([place])    a. EF 40-45% by echo in 12/2015 with cath showing normal cors  . Depression with anxiety   . Diabetes mellitus without  complication ([place])   . Enlarged heart   . Hypertension   . Hypertensive heart disease 2009   Pulmonary edema postpartum; mild to moderate mitral regurgitation when hospitalized for CHF in 2009; Echocardiogram in 12/2009-no MR and normal EF; normal CXR in 09/2010  . Migraine headache   . Miscarriage 03/19/2013  . Obesity 04/16/2009  . Osteoarthritis, knee 03/29/2011  . Preeclampsia   . Pregnant   . Pulmonary edema   . Sleep apnea   . Threatened abortion in early pregnancy 03/15/2013    Past Surgical History:  Procedure Laterality Date  . BREAST REDUCTION SURGERY  2002  . CARDIAC CATHETERIZATION N/A 12/22/2015   Procedure: Left Heart Cath and Coronary Angiography;  Surgeon: Peter M Martinique, MD;  Location: [facility];  Service: Cardiovascular;  Laterality: N/A;  . CESAREAN SECTION N/A 04/09/2014   Procedure: CESAREAN SECTION;  Surgeon: Mora Bellman, MD;  Location: [facility];  Service: Obstetrics;  Laterality: N/A;  . CHOLECYSTECTOMY       reports that she has never smoked. She has never used smokeless tobacco. She reports current alcohol use. She reports that she does not use drugs.  Allergies  Allergen Reactions  . Diclofenac Swelling    AND POSSIBLE SYNCOPE; tolerates ibuprofen per pt  . Tramadol Nausea And Vomiting and Nausea Only    Itching (12/21); tolerates ibuprofen per pt  . Vicodin [Hydrocodone-Acetaminophen] Itching and Nausea Only  . Hydrocodone Bitartrate Er Itching    Family History  Problem Relation Age of Onset  . Diabetes Mother  79  . Heart disease Mother   . Hyperlipidemia Paternal Grandfather   . Hypertension Paternal Grandfather   . Heart disease Father   . Hypertension Father   . Heart disease Maternal Grandmother 60  . ADD / ADHD Son   . Hypertension Maternal Uncle   . Heart attack Brother   . Sudden death Neg Hx   . Colon cancer Neg Hx   . Celiac disease Neg Hx   . Inflammatory bowel disease Neg Hx     Prior to Admission medications    Medication Sig Start Date End Date Taking? Authorizing Provider  albuterol (PROVENTIL HFA;VENTOLIN HFA) 108 (90 Base) MCG/ACT inhaler Inhale 1 puff into the lungs every 6 (six) hours as needed for wheezing or shortness of breath.   Yes [provider]  aspirin EC 81 MG tablet Take 81 mg by mouth daily.   Yes [provider]  atorvastatin (LIPITOR) 20 MG tablet Take 1 tablet (20 mg total) by mouth daily at 6 PM. 06/03/18  Yes Tat, Shanon Brow, MD  budesonide-formoterol (SYMBICORT) 80-4.5 MCG/ACT inhaler Inhale 2 puffs into the lungs 2 (two) times daily. 05/04/18  Yes Shah, Pratik D, DO  carvedilol (COREG) 25 MG tablet Take 2 tablets (50 mg total) by mouth 2 (two) times daily with a meal. 06/03/18  Yes Tat, David, MD  citalopram (CELEXA) 20 MG tablet Take 20 mg by mouth daily. 08/19/17  Yes [provider]  gabapentin (NEURONTIN) 600 MG tablet Take 600 mg by mouth 3 (three) times daily.    Yes [provider]  hydrALAZINE (APRESOLINE) 100 MG tablet Take 1 tablet (100 mg total) by mouth 3 (three) times daily. 06/03/18  Yes Tat, Shanon Brow, MD  insulin glargine (LANTUS) 100 UNIT/ML injection Inject 30 Units at bedtime into the skin.    Yes [provider]  insulin lispro (HUMALOG) 100 UNIT/ML injection Inject 15 Units into the skin 3 (three) times daily before meals.    Yes [provider]  metFORMIN (GLUCOPHAGE) 500 MG tablet Take 500 mg 2 (two) times daily with a meal by mouth.   Yes [provider]  nitroGLYCERIN (NITROSTAT) 0.4 MG SL tablet Place 1 tablet (0.4 mg total) under the tongue every 5 (five) minutes as needed for chest pain. 09/05/15  Yes Kathie Dike, MD  omeprazole (PRILOSEC) 40 MG capsule Take 40 mg by mouth daily.   Yes [provider]  ondansetron (ZOFRAN) 4 MG tablet Take 4 mg by mouth daily as needed for nausea or vomiting.   Yes [provider]  potassium chloride SA (K-DUR,KLOR-CON) 20 MEQ tablet Take 1 tablet (20  mEq total) by mouth daily. 05/12/17  Yes Holley Bouche, NP  pyridostigmine (MESTINON) 60 MG tablet Take 60 mg by mouth every 8 (eight) hours.   Yes [provider]  sacubitril-valsartan (ENTRESTO) 97-103 MG Take 1 tablet by mouth 2 (two) times daily. 04/09/16  Yes Arrien, Jimmy Picket, MD  torsemide (DEMADEX) 20 MG tablet Please take 40 mg oral every morning and 20 mg every evening. Patient taking differently: Take 20-40 mg 2 (two) times daily by mouth. Please take 40 mg oral every morning and 20 mg every evening. 05/10/16  Yes Elgergawy, Silver Huguenin, MD  traZODone (DESYREL) 50 MG tablet Take 1 tablet (50 mg total) by mouth at bedtime. 06/03/18  Yes Tat, Shanon Brow, MD  Ipratropium-Albuterol (COMBIVENT RESPIMAT) 20-100 MCG/ACT AERS respimat Inhale 1 puff into the lungs every 6 (six) hours as needed for wheezing or  shortness of breath. 05/04/18   Manuella Ghazi, Pratik D, DO  spironolactone (ALDACTONE) 50 MG tablet Take 1 tablet (50 mg total) by mouth daily. 09/30/17 05/12/18  Lorella Nimrod, MD    Physical Exam: Vitals:   10/26/18 1208 10/26/18 1209 10/26/18 1300 10/26/18 1500  BP:  (!) 160/96 (!) 154/94 (!) 181/109  Pulse:  94 89 95  Resp:  20    Temp:  98 F (36.7 C)    TempSrc:  Oral    SpO2:  96% 96% 94%  Weight: 108.9 kg     Height: 5\' 6"  (1.676 m)       Constitutional: NAD, calm, comfortable Vitals:   10/26/18 1208 10/26/18 1209 10/26/18 1300 10/26/18 1500  BP:  (!) 160/96 (!) 154/94 (!) 181/109  Pulse:  94 89 95  Resp:  20    Temp:  98 F (36.7 C)    TempSrc:  Oral    SpO2:  96% 96% 94%  Weight: 108.9 kg     Height: 5\' 6"  (1.676 m)      Eyes: PERRL, lids and conjunctivae normal ENMT: Mucous membranes are moist. Posterior pharynx clear of any exudate or lesions. Neck: normal, supple, no masses, no thyromegaly Respiratory: clear to auscultation bilaterally, no wheezing, no crackles. Normal respiratory effort. No accessory muscle use.  Cardiovascular: Regular rate and rhythm,  no murmurs / rubs / gallops.  2+ pitting bilateral pitting pedal edema L>> R, with mild hyperemia to ankle region of left leg. 2+ pedal pulses.  Abdomen: no tenderness, no masses palpated. No hepatosplenomegaly. Bowel sounds positive.  Musculoskeletal: no clubbing / cyanosis. No joint deformity upper and lower extremities. Good ROM, no contractures. Normal muscle tone.  Skin: no rashes, lesions, ulcers. No induration Neurologic: CN 2-12 grossly intact.  Strength 5/5 in all 4.  Psychiatric: Normal judgment and insight. Alert and oriented x 3. Normal mood.   Labs on Admission: I have personally reviewed following labs and imaging studies  CBC: Recent Labs  Lab 10/26/18 1243  WBC 8.4  NEUTROABS 6.4  HGB 9.7*  HCT 32.5*  MCV 71.9*  PLT 202   Basic Metabolic Panel: Recent Labs  Lab 10/26/18 1243 10/26/18 1534  NA 139  --   K 3.1*  --   CL 102  --   CO2 27  --   GLUCOSE 241*  --   BUN 12  --   CREATININE 1.14*  --   CALCIUM 9.1  --   MG  --  1.5*   Liver Function Tests: Recent Labs  Lab 10/26/18 1243  AST 21  ALT 14  ALKPHOS 79  BILITOT 0.5  PROT 7.4  ALBUMIN 3.6   Cardiac Enzymes: Recent Labs  Lab 10/26/18 1243  TROPONINI 0.04*   Urine analysis:    Component Value Date/Time   COLORURINE COLORLESS (A) 10/26/2018 1259   APPEARANCEUR CLEAR 10/26/2018 1259   LABSPEC 1.005 10/26/2018 1259   PHURINE 6.0 10/26/2018 1259   GLUCOSEU NEGATIVE 10/26/2018 1259   HGBUR NEGATIVE 10/26/2018 1259   BILIRUBINUR NEGATIVE 10/26/2018 1259   KETONESUR NEGATIVE 10/26/2018 1259   PROTEINUR NEGATIVE 10/26/2018 1259   UROBILINOGEN 0.2 04/29/2014 1115   NITRITE NEGATIVE 10/26/2018 1259   LEUKOCYTESUR NEGATIVE 10/26/2018 1259    Radiological Exams on Admission: Dg Chest 2 View  Result Date: 10/26/2018 CLINICAL DATA:  Shortness of breath.  Swelling in legs. EXAM: CHEST - 2 VIEW COMPARISON:  06/02/2018. FINDINGS: The heart is enlarged. There is mild vascular congestion. No  consolidation or edema. No effusion or pneumothorax. Bones unremarkable. Similar appearance to priors. IMPRESSION: Cardiomegaly. Mild vascular congestion. No frank pulmonary edema or consolidation. Electronically Signed   By: Staci Righter M.D.   On: 10/26/2018 14:40    EKG: Independently reviewed.  Sinus rate 88, QTc 515, with LVH.  T wave changes- inversions 1 and aVL  Assessment/Plan Active Problems:   Essential hypertension   Type 2 diabetes mellitus without complication (HCC)   Acute on chronic combined systolic and diastolic CHF, NYHA class 4 (HCC)   Depression   Acute exacerbation of CHF (congestive heart failure) (HCC)    Acute on chronic combined diastolic and systolic CHF-dyspnea,, bilateral lower extremity swelling L > R, weight 240 from 230 in January.  Chest x-ray shows pulmonary vascular congestion.  She is on room air.  BNP elevated at 778, but more significant elevations in the past from CHF.  Reports compliance with low-salt diet and torsemide 40 a.m, 20mg  a.m, increased to 40 Q12h. last echo 04/2018 EF 45 to 50%, G2DD -Continue IV Lasix at 60 twice daily -BMP a.m. -Daily weights, strict input output -Trend troponin -Left lower extremity venous ultrasound rule out DVT -Continue home Coreg, Dralzine, Entresto she has been compliant  Hypokalemia-3.1.  Likely from diuretics. -Magnesium, replete K -Resume home potassium supplements  Abnormal EKG-EKG shows T wave changes in 1 and aVL. Denies chest pain.  Chronically elevated troponin 0 0.04.  Cardiac cath 12/2015-no significant coronary artery disease, good LV function. - Trend Troponin - Repeat EKG -Continue aspirin, statins  Prolonged QTC- 515.  Likely combination of hypokalemia and antidepressant sertraline -Check magnesium, replete potassium  HTN-150s to 180s. -Continue home Coreg, Entresto, hydralazine  DM-random glucose 241.  -Continue home Lantus at reduced dose 30 units nightly, hold meal coverage insulin for  now - SSI -Would hold metformin  OSA-  - CPAP QHS  DVT prophylaxis: Lovenox Code Status: Full Family Communication: None at bedside Disposition Plan: Per rounding team Consults called: None Admission status: Obs, tele  Bethena Roys MD [facility]  10/26/2018, 5:02 PM

## 2018-10-26 NOTE — Progress Notes (Signed)
Per charles bodenheimer will hold lasix 20mg  tonight unless pt becomes short of breath. Pt does not want to take this late in night.

## 2018-10-26 NOTE — ED Notes (Signed)
ED TO INPATIENT HANDOFF REPORT  ED Nurse Name and Phone #: [phone]   S Name/Age/Gender Chelsey Santiago 43 y.o. female Room/Bed: APA10/APA10  Code Status   Code Status: Prior  Home/SNF/Other Home Patient oriented to: self, place, time and situation Is this baseline? Yes   Triage Complete: Triage complete  Chief Complaint sob  Triage Note Pt c/o of sob x 1 week with CHF    Allergies Allergies  Allergen Reactions  . Diclofenac Swelling    AND POSSIBLE SYNCOPE; tolerates ibuprofen per pt  . Tramadol Nausea And Vomiting and Nausea Only    Itching (12/21); tolerates ibuprofen per pt  . Vicodin [Hydrocodone-Acetaminophen] Itching and Nausea Only  . Hydrocodone Bitartrate Er Itching    Level of Care/Admitting Diagnosis ED Disposition    ED Disposition Condition Comment   Admit  Hospital Area: [facility] [[id number]]  Level of Care: Telemetry [5]  Covid Evaluation: Screening Protocol (No Symptoms)  Diagnosis: Acute exacerbation of CHF (congestive heart failure) [facility]) [010071]  Admitting Physician: Bethena Roys [phone]  Attending Physician: Bethena Roys Nessa.Cuff  PT Class (Do Not Modify): Observation [104]  PT Acc Code (Do Not Modify): Observation [10022]       B Medical/Surgery History Past Medical History:  Diagnosis Date  . Anemia    H&H of 10.6/33 and 07/2008 and 11.9/35 and 09/2010  . Anxiety   . CHF (congestive heart failure) ([place])    a. EF 40-45% by echo in 12/2015 with cath showing normal cors  . Depression with anxiety   . Diabetes mellitus without complication ([place])   . Enlarged heart   . Hypertension   . Hypertensive heart disease 2009   Pulmonary edema postpartum; mild to moderate mitral regurgitation when hospitalized for CHF in 2009; Echocardiogram in 12/2009-no MR and normal EF; normal CXR in 09/2010  . Migraine headache   . Miscarriage 03/19/2013  . Obesity 04/16/2009  . Osteoarthritis, knee 03/29/2011  . Preeclampsia   .  Pregnant   . Pulmonary edema   . Sleep apnea   . Threatened abortion in early pregnancy 03/15/2013   Past Surgical History:  Procedure Laterality Date  . BREAST REDUCTION SURGERY  2002  . CARDIAC CATHETERIZATION N/A 12/22/2015   Procedure: Left Heart Cath and Coronary Angiography;  Surgeon: Peter M Martinique, MD;  Location: [facility];  Service: Cardiovascular;  Laterality: N/A;  . CESAREAN SECTION N/A 04/09/2014   Procedure: CESAREAN SECTION;  Surgeon: Mora Bellman, MD;  Location: [facility];  Service: Obstetrics;  Laterality: N/A;  . CHOLECYSTECTOMY       A IV Location/Drains/Wounds Patient Lines/Drains/Airways Status   Active Line/Drains/Airways    Name:   Placement date:   Placement time:   Site:   Days:   Peripheral IV 10/26/18 Right Antecubital   10/26/18    1242    Antecubital   less than 1          Intake/Output Last 24 hours No intake or output data in the 24 hours ending 10/26/18 1557  Labs/Imaging Results for orders placed or performed during the hospital encounter of 10/26/18 (from the past 48 hour(s))  Brain natriuretic peptide     Status: Abnormal   Collection Time: 10/26/18 12:43 PM  Result Value Ref Range   B Natriuretic Peptide 778.0 (H) 0.0 - 100.0 pg/mL    Comment: Performed at [facility], [address], [place], [place] [address]  Comprehensive metabolic panel     Status: Abnormal  Collection Time: 10/26/18 12:43 PM  Result Value Ref Range   Sodium 139 135 - 145 mmol/L   Potassium 3.1 (L) 3.5 - 5.1 mmol/L   Chloride 102 98 - 111 mmol/L   CO2 27 22 - 32 mmol/L   Glucose, Bld 241 (H) 70 - 99 mg/dL   BUN 12 6 - 20 mg/dL   Creatinine, Ser 1.14 (H) 0.44 - 1.00 mg/dL   Calcium 9.1 8.9 - 10.3 mg/dL   Total Protein 7.4 6.5 - 8.1 g/dL   Albumin 3.6 3.5 - 5.0 g/dL   AST 21 15 - 41 U/L   ALT 14 0 - 44 U/L   Alkaline Phosphatase 79 38 - 126 U/L   Total Bilirubin 0.5 0.3 - 1.2 mg/dL   GFR calc non Af Amer 59 (L) >60 mL/min   GFR calc Af Amer >60 >60  mL/min   Anion gap 10 5 - 15    Comment: Performed at [facility], [address], [place], [place] [address]  CBC with Differential     Status: Abnormal   Collection Time: 10/26/18 12:43 PM  Result Value Ref Range   WBC 8.4 4.0 - 10.5 K/uL   RBC 4.52 3.87 - 5.11 MIL/uL   Hemoglobin 9.7 (L) 12.0 - 15.0 g/dL   HCT 32.5 (L) 36.0 - 46.0 %   MCV 71.9 (L) 80.0 - 100.0 fL   MCH 21.5 (L) 26.0 - 34.0 pg   MCHC 29.8 (L) 30.0 - 36.0 g/dL   RDW 17.3 (H) 11.5 - 15.5 %   Platelets 245 150 - 400 K/uL   nRBC 0.0 0.0 - 0.2 %   Neutrophils Relative % 75 %   Neutro Abs 6.4 1.7 - 7.7 K/uL   Lymphocytes Relative 17 %   Lymphs Abs 1.4 0.7 - 4.0 K/uL   Monocytes Relative 5 %   Monocytes Absolute 0.4 0.1 - 1.0 K/uL   Eosinophils Relative 1 %   Eosinophils Absolute 0.1 0.0 - 0.5 K/uL   Basophils Relative 1 %   Basophils Absolute 0.0 0.0 - 0.1 K/uL   Immature Granulocytes 1 %   Abs Immature Granulocytes 0.05 0.00 - 0.07 K/uL    Comment: Performed at [facility], [address] [place], [place] [address]  Troponin I - ONCE - STAT     Status: Abnormal   Collection Time: 10/26/18 12:43 PM  Result Value Ref Range   Troponin I 0.04 (HH) <0.03 ng/mL    Comment: CRITICAL RESULT CALLED TO, READ BACK BY AND VERIFIED WITH: CARDWELL,L AT 1325 ON 6.11.20 BY ISLEY,B Performed at [facility], [address] [place], [place] [address]   Urinalysis, Routine w reflex microscopic     Status: Abnormal   Collection Time: 10/26/18 12:59 PM  Result Value Ref Range   Color, Urine COLORLESS (A) YELLOW   APPearance CLEAR CLEAR   Specific Gravity, Urine 1.005 1.005 - 1.030   pH 6.0 5.0 - 8.0   Glucose, UA NEGATIVE NEGATIVE mg/dL   Hgb urine dipstick NEGATIVE NEGATIVE   Bilirubin Urine NEGATIVE NEGATIVE   Ketones, ur NEGATIVE NEGATIVE mg/dL   Protein, ur NEGATIVE NEGATIVE mg/dL   Nitrite NEGATIVE NEGATIVE   Leukocytes,Ua NEGATIVE NEGATIVE    Comment: Performed at [facility], [address], [place], [place]  [address]  Magnesium     Status: Abnormal   Collection Time: 10/26/18  3:34 PM  Result Value Ref Range   Magnesium 1.5 (L) 1.7 - 2.4 mg/dL  Comment: Performed at [facility], [address], [place],  [address]   Dg Chest 2 View  Result Date: 10/26/2018 CLINICAL DATA:  Shortness of breath.  Swelling in legs. EXAM: CHEST - 2 VIEW COMPARISON:  06/02/2018. FINDINGS: The heart is enlarged. There is mild vascular congestion. No consolidation or edema. No effusion or pneumothorax. Bones unremarkable. Similar appearance to priors. IMPRESSION: Cardiomegaly. Mild vascular congestion. No frank pulmonary edema or consolidation. Electronically Signed   By: Staci Righter M.D.   On: 10/26/2018 14:40    Pending Labs Unresulted Labs (From admission, onward)    Start     Ordered   10/26/18 1800  Troponin I - Now Then Q6H  Now then every 6 hours,   R (with STAT occurrences)     10/26/18 1529   10/26/18 1510  SARS Coronavirus 2 [facility] order, Performed in [place] hospital lab)  (Novel Coronavirus, [facility] Order))  Once,   STAT    Question:  Patient immune status  Answer:  Normal   10/26/18 1510   Signed and Held  Basic metabolic panel  Tomorrow morning,   R     Signed and Held          Vitals/Pain Today's Vitals   10/26/18 1208 10/26/18 1209 10/26/18 1300 10/26/18 1500  BP:  (!) 160/96 (!) 154/94 (!) 181/109  Pulse:  94 89 95  Resp:  20    Temp:  98 F (36.7 C)    TempSrc:  Oral    SpO2:  96% 96% 94%  Weight: 108.9 kg     Height: 5\' 6"  (1.676 m)     PainSc: 0-No pain       Isolation Precautions No active isolations  Medications Medications  potassium chloride 10 mEq in 100 mL IVPB (10 mEq Intravenous New Bag/Given 10/26/18 1513)  potassium chloride SA (K-DUR) CR tablet 40 mEq (40 mEq Oral Given 10/26/18 1553)  acetaminophen (TYLENOL) tablet 650 mg (650 mg Oral Given 10/26/18 1417)  ondansetron (ZOFRAN) injection 4 mg (4 mg Intravenous Given 10/26/18 1417)  furosemide  (LASIX) injection 40 mg (40 mg Intravenous Given 10/26/18 1509)    Mobility walks Low fall risk   Focused Assessments    R Recommendations: See Admitting Provider Note  Report given to:   Additional Notes:

## 2018-10-27 DIAGNOSIS — Z6838 Body mass index (BMI) 38.0-38.9, adult: Secondary | ICD-10-CM | POA: Diagnosis not present

## 2018-10-27 DIAGNOSIS — Z885 Allergy status to narcotic agent status: Secondary | ICD-10-CM | POA: Diagnosis not present

## 2018-10-27 DIAGNOSIS — E785 Hyperlipidemia, unspecified: Secondary | ICD-10-CM | POA: Diagnosis present

## 2018-10-27 DIAGNOSIS — I429 Cardiomyopathy, unspecified: Secondary | ICD-10-CM | POA: Diagnosis present

## 2018-10-27 DIAGNOSIS — E119 Type 2 diabetes mellitus without complications: Secondary | ICD-10-CM

## 2018-10-27 DIAGNOSIS — F329 Major depressive disorder, single episode, unspecified: Secondary | ICD-10-CM

## 2018-10-27 DIAGNOSIS — F419 Anxiety disorder, unspecified: Secondary | ICD-10-CM | POA: Diagnosis present

## 2018-10-27 DIAGNOSIS — Y92230 Patient room in hospital as the place of occurrence of the external cause: Secondary | ICD-10-CM | POA: Diagnosis not present

## 2018-10-27 DIAGNOSIS — I10 Essential (primary) hypertension: Secondary | ICD-10-CM | POA: Diagnosis not present

## 2018-10-27 DIAGNOSIS — E876 Hypokalemia: Secondary | ICD-10-CM | POA: Diagnosis present

## 2018-10-27 DIAGNOSIS — M1710 Unilateral primary osteoarthritis, unspecified knee: Secondary | ICD-10-CM | POA: Diagnosis present

## 2018-10-27 DIAGNOSIS — Z794 Long term (current) use of insulin: Secondary | ICD-10-CM | POA: Diagnosis not present

## 2018-10-27 DIAGNOSIS — R9431 Abnormal electrocardiogram [ECG] [EKG]: Secondary | ICD-10-CM | POA: Diagnosis not present

## 2018-10-27 DIAGNOSIS — Z20828 Contact with and (suspected) exposure to other viral communicable diseases: Secondary | ICD-10-CM | POA: Diagnosis present

## 2018-10-27 DIAGNOSIS — Z9049 Acquired absence of other specified parts of digestive tract: Secondary | ICD-10-CM | POA: Diagnosis not present

## 2018-10-27 DIAGNOSIS — Z79899 Other long term (current) drug therapy: Secondary | ICD-10-CM | POA: Diagnosis not present

## 2018-10-27 DIAGNOSIS — T43225A Adverse effect of selective serotonin reuptake inhibitors, initial encounter: Secondary | ICD-10-CM | POA: Diagnosis not present

## 2018-10-27 DIAGNOSIS — Z7982 Long term (current) use of aspirin: Secondary | ICD-10-CM | POA: Diagnosis not present

## 2018-10-27 DIAGNOSIS — I110 Hypertensive heart disease with heart failure: Secondary | ICD-10-CM | POA: Diagnosis not present

## 2018-10-27 DIAGNOSIS — I509 Heart failure, unspecified: Secondary | ICD-10-CM | POA: Diagnosis present

## 2018-10-27 DIAGNOSIS — Z7951 Long term (current) use of inhaled steroids: Secondary | ICD-10-CM | POA: Diagnosis not present

## 2018-10-27 DIAGNOSIS — K219 Gastro-esophageal reflux disease without esophagitis: Secondary | ICD-10-CM

## 2018-10-27 DIAGNOSIS — Z833 Family history of diabetes mellitus: Secondary | ICD-10-CM | POA: Diagnosis not present

## 2018-10-27 DIAGNOSIS — E6609 Other obesity due to excess calories: Secondary | ICD-10-CM

## 2018-10-27 DIAGNOSIS — E669 Obesity, unspecified: Secondary | ICD-10-CM | POA: Diagnosis present

## 2018-10-27 DIAGNOSIS — G4733 Obstructive sleep apnea (adult) (pediatric): Secondary | ICD-10-CM | POA: Diagnosis present

## 2018-10-27 DIAGNOSIS — I5043 Acute on chronic combined systolic (congestive) and diastolic (congestive) heart failure: Secondary | ICD-10-CM | POA: Diagnosis present

## 2018-10-27 DIAGNOSIS — Z888 Allergy status to other drugs, medicaments and biological substances status: Secondary | ICD-10-CM | POA: Diagnosis not present

## 2018-10-27 DIAGNOSIS — I5021 Acute systolic (congestive) heart failure: Secondary | ICD-10-CM | POA: Diagnosis not present

## 2018-10-27 LAB — GLUCOSE, CAPILLARY
Glucose-Capillary: 248 mg/dL — ABNORMAL HIGH (ref 70–99)
Glucose-Capillary: 319 mg/dL — ABNORMAL HIGH (ref 70–99)
Glucose-Capillary: 191 mg/dL — ABNORMAL HIGH (ref 70–99)
Glucose-Capillary: 275 mg/dL — ABNORMAL HIGH (ref 70–99)

## 2018-10-27 LAB — BASIC METABOLIC PANEL
Anion gap: 14 (ref 5–15)
BUN: 17 mg/dL (ref 6–20)
CO2: 26 mmol/L (ref 22–32)
Calcium: 9 mg/dL (ref 8.9–10.3)
Chloride: 99 mmol/L (ref 98–111)
Creatinine, Ser: 1.09 mg/dL — ABNORMAL HIGH (ref 0.44–1.00)
GFR calc Af Amer: >60 mL/min (ref >60)
GFR calc non Af Amer: >60 mL/min (ref >60)
Glucose, Bld: 253 mg/dL — ABNORMAL HIGH (ref 70–99)
Potassium: 3.8 mmol/L (ref 3.5–5.1)
Sodium: 139 mmol/L (ref 135–145)

## 2018-10-27 LAB — TROPONIN I: Troponin I: 0.03 ng/mL (ref <0.03)

## 2018-10-27 LAB — MRSA PCR SCREENING: MRSA by PCR: POSITIVE — AB

## 2018-10-27 MED ORDER — CLONAZEPAM 0.5 MG PO TABS
0.5000 mg | ORAL_TABLET | Freq: Every day | ORAL | Status: DC
  Administered 2018-10-27 – 2018-10-29 (×3): 0.5 mg via ORAL
  Filled 2018-10-27 (×3): qty 1

## 2018-10-27 MED ORDER — ARIPIPRAZOLE 10 MG PO TABS
10.0000 mg | ORAL_TABLET | Freq: Every day | ORAL | Status: DC
  Administered 2018-10-27 – 2018-10-29 (×3): 10 mg via ORAL
  Filled 2018-10-27 (×3): qty 1

## 2018-10-27 MED ORDER — MUPIROCIN 2 % EX OINT
1.0000 "application " | TOPICAL_OINTMENT | Freq: Two times a day (BID) | CUTANEOUS | Status: DC
  Administered 2018-10-27 – 2018-10-29 (×4): 1 via NASAL
  Filled 2018-10-27: qty 22

## 2018-10-27 MED ORDER — CHLORHEXIDINE GLUCONATE CLOTH 2 % EX PADS
6.0000 | MEDICATED_PAD | Freq: Every day | CUTANEOUS | Status: DC
  Administered 2018-10-28 – 2018-10-29 (×2): 6 via TOPICAL

## 2018-10-27 MED ORDER — MAGNESIUM SULFATE 2 GM/50ML IV SOLN
2.0000 g | Freq: Once | INTRAVENOUS | Status: AC
  Administered 2018-10-27: 2 g via INTRAVENOUS
  Filled 2018-10-27: qty 50

## 2018-10-27 MED ORDER — INSULIN GLARGINE 100 UNIT/ML ~~LOC~~ SOLN
28.0000 [IU] | Freq: Every day | SUBCUTANEOUS | Status: DC
  Administered 2018-10-27: 28 [IU] via SUBCUTANEOUS
  Filled 2018-10-27 (×3): qty 0.28

## 2018-10-27 MED ORDER — FUROSEMIDE 10 MG/ML IJ SOLN
80.0000 mg | Freq: Two times a day (BID) | INTRAMUSCULAR | Status: DC
  Administered 2018-10-27 – 2018-10-29 (×4): 80 mg via INTRAVENOUS
  Filled 2018-10-27 (×4): qty 8

## 2018-10-27 MED ORDER — TRAZODONE HCL 50 MG PO TABS
200.0000 mg | ORAL_TABLET | Freq: Every day | ORAL | Status: DC
  Administered 2018-10-27 – 2018-10-28 (×2): 200 mg via ORAL
  Filled 2018-10-27 (×2): qty 4

## 2018-10-27 NOTE — Progress Notes (Signed)
Inpatient Diabetes Program Recommendations  AACE/ADA: New Consensus Statement on Inpatient Glycemic Control (2015)  Target Ranges:  Prepandial:   less than 140 mg/dL      Peak postprandial:   less than 180 mg/dL (1-2 hours)      Critically ill patients:  140 - 180 mg/dL   Results for HAELI, GERLICH (MRN [id number]) as of 10/27/2018 09:47  Ref. Range 10/26/2018 18:13 10/26/2018 21:36 10/27/2018 07:10  Glucose-Capillary Latest Ref Range: 70 - 99 mg/dL 153 (H) 237 (H) 248 (H)   Review of Glycemic Control  Diabetes history: DM 2 Outpatient Diabetes medications: Lantus 30 units, Humalog 15 units tid meal coverage, Metformin 500 mg bid Current orders for Inpatient glycemic control:  Lantus 20 units Novolog 0-15 units tid  Inpatient Diabetes Program Recommendations:    Glucose 248 this am after Lantus 20 units. Consider increasing Lantus to 28-30 units.  May also need to add meal coverage if postprandial glucose trends are elevated.  Thanks,  Tama Headings RN, MSN, BC-ADM Inpatient Diabetes Coordinator Team Pager [phone] (8a-5p)

## 2018-10-27 NOTE — Progress Notes (Signed)
PROGRESS NOTE    Chelsey Santiago  HAL:[id number] DOB: [birth year] DOA: 10/26/2018 PCP: Rosita Fire, MD     Brief Narrative:  43 y.o. female with medical history significant for systolic and diastolic CHF, diabetes mellitus, depression, hypertension, obstructive sleep apnea, presented to the ED with complaints of difficulty breathing of 1 week duration, generalized weakness and bilateral lower extremity swelling left worse than right over the past 3 weeks. Difficulty breathing is worse with exertion. She endorses stable two-pillow orthopnea.  She reports onset of whitish productive cough today.  No Fever no chills.  She feels she is accumulating fluid, abdomen is bloated And she reports increase in her weight up to 240 from baseline of 230.  Assessment & Plan: 1-acute on chronic combined diastolic and systolic heart failure -Weight on admission 240; at baseline 230 pounds. -Chest x-ray with vascular congestion and BNP elevated. -Continue daily weights, strict I's and O's, low-sodium diet and IV diuretics -Will also continue home carvedilol, Entresto and hydralazine. -Left lower extremity venous ultrasound negative for DVT -Continue to follow renal function and electrolytes closely -Continue to monitor on telemetry -Repeat 2D echo.  2-Essential hypertension -Continue current antihypertensive regimen -Patient educated about low-sodium diet -Follow vital signs.  3-Type 2 diabetes mellitus without complication (HCC) -Continue sliding scale insulin -Continue Lantus; dose adjusted to 28 units daily for better control -Follow CBGs.  4-class 2 obesity -Body mass index is 38.43 kg/m. -Low calorie diet, portion control and increase physical activity has been discussed with patient.  5-hyperlipidemia -Continue statins  6-depression -Mood is stable -No suicidal ideation or hallucination -Continue Celexa.  7-GERD -continue PPI  DVT prophylaxis: Lovenox Code Status: Full  code Family Communication: No family at bedside. Disposition Plan: Remains inpatient, continue IV diuresis, follow daily weights, low-sodium diet and strict intake and output.  Consultants:   None  Procedures:   See below for x-ray reports  Echo: Pending  Antimicrobials:  Anti-infectives (From admission, onward)   None       Subjective: No fever, no chest pain.  Still with significant fluid overload findings on exam.  Patient reports some orthopnea.  Objective: Vitals:   10/26/18 2142 10/26/18 2331 10/27/18 0534 10/27/18 1500  BP: (!) 151/117  (!) 159/100 (!) 151/101  Pulse: 85 87 92   Resp: 16 16 16    Temp: 98.3 F (36.8 C)  98.2 F (36.8 C)   TempSrc:      SpO2: 100% 98% 94%   Weight:   108 kg   Height:        Intake/Output Summary (Last 24 hours) at 10/27/2018 1552 Last data filed at 10/27/2018 1500 Gross per 24 hour  Intake 1750 ml  Output 1100 ml  Net 650 ml   Filed Weights   10/26/18 1208 10/27/18 0534  Weight: 108.9 kg 108 kg    Examination: General exam: Alert, awake, oriented x 3; reporting orthopnea and is still with significant signs of fluid overload on exam. Respiratory system: No frank crackles; decreased breath sounds at the bases, no using accessory muscles.  Normal respiratory effort. Cardiovascular system:RRR. No murmurs, rubs, gallops. Gastrointestinal system: Abdomen is nondistended, soft and nontender. No organomegaly or masses felt. Normal bowel sounds heard. Central nervous system: Alert and oriented. No focal neurological deficits. 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 Skin: No rashes, lesions or ulcers Psychiatry: Judgement and insight appear normal. Mood & affect appropriate.  Data Reviewed: I have personally reviewed following labs and imaging studies  CBC: Recent Labs  Lab 10/26/18 1243  WBC  8.4  NEUTROABS 6.4  HGB 9.7*  HCT 32.5*  MCV 71.9*  PLT 350   Basic Metabolic Panel: Recent Labs  Lab 10/26/18 1243 10/26/18 1534 10/27/18 0600  NA 139  --  139  K 3.1*  --  3.8  CL 102  --  99  CO2 27  --  26  GLUCOSE 241*  --  253*  BUN 12  --  17  CREATININE 1.14*  --  1.09*  CALCIUM 9.1  --  9.0  MG  --  1.5*  --    GFR: Estimated Creatinine Clearance: 82.8 mL/min (A) (by C-G formula based on SCr of 1.09 mg/dL (H)).   Liver Function Tests: Recent Labs  Lab 10/26/18 1243  AST 21  ALT 14  ALKPHOS 79  BILITOT 0.5  PROT 7.4  ALBUMIN 3.6   Cardiac Enzymes: Recent Labs  Lab 10/26/18 1243 10/26/18 1739 10/27/18 0029  TROPONINI 0.04* 0.04* 0.03*   CBG: Recent Labs  Lab 10/26/18 1813 10/26/18 2136 10/27/18 0710 10/27/18 1102  GLUCAP 153* 237* 248* 319*   Urine analysis:    Component Value Date/Time   COLORURINE COLORLESS (A) 10/26/2018 1259   APPEARANCEUR CLEAR 10/26/2018 1259   LABSPEC 1.005 10/26/2018 1259   PHURINE 6.0 10/26/2018 1259   GLUCOSEU NEGATIVE 10/26/2018 1259   HGBUR NEGATIVE 10/26/2018 1259   BILIRUBINUR NEGATIVE 10/26/2018 1259   KETONESUR NEGATIVE 10/26/2018 1259   PROTEINUR NEGATIVE 10/26/2018 1259   UROBILINOGEN 0.2 04/29/2014 1115   NITRITE NEGATIVE 10/26/2018 1259   LEUKOCYTESUR NEGATIVE 10/26/2018 1259    Recent Results (from the past 240 hour(s))  SARS Coronavirus 2 [facility] order, Performed in [place] hospital lab)     Status: None   Collection Time: 10/26/18  3:10 PM   Specimen: Nasopharyngeal Swab  Result Value Ref Range Status   SARS Coronavirus 2 NEGATIVE NEGATIVE Final    Comment: (NOTE) If result is NEGATIVE SARS-CoV-2 target nucleic acids are NOT DETECTED. The SARS-CoV-2 RNA is generally detectable in upper and lower  respiratory specimens during the acute phase of infection. The lowest  concentration of SARS-CoV-2 viral copies this assay can detect is 250  copies / mL. A negative result does not  preclude SARS-CoV-2 infection  and should not be used as the sole basis for treatment or other  patient management decisions.  A negative result may occur with  improper specimen collection / handling, submission of specimen other  than nasopharyngeal swab, presence of viral mutation(s) within the  areas targeted by this assay, and inadequate number of viral copies  (<250 copies / mL). A negative result must be combined with clinical  observations, patient history, and epidemiological information. If result is POSITIVE SARS-CoV-2 target nucleic acids are DETECTED. The SARS-CoV-2 RNA is generally detectable in upper and lower  respiratory specimens dur ing the acute phase of infection.  Positive  results are indicative of active infection with SARS-CoV-2.  Clinical  correlation with patient history and other diagnostic information is  necessary to determine patient infection status.  Positive results do  not rule out bacterial infection or co-infection with other viruses. If result is PRESUMPTIVE POSTIVE SARS-CoV-2 nucleic acids MAY BE PRESENT.   A presumptive positive result was obtained on the submitted specimen  and confirmed on repeat testing.  While 2019 novel coronavirus  (SARS-CoV-2) nucleic acids may be present in  the submitted sample  additional confirmatory testing may be necessary for epidemiological  and / or clinical management purposes  to differentiate between  SARS-CoV-2 and other Sarbecovirus currently known to infect humans.  If clinically indicated additional testing with an alternate test  methodology [phone]) is advised. The SARS-CoV-2 RNA is generally  detectable in upper and lower respiratory sp ecimens during the acute  phase of infection. The expected result is Negative. Fact Sheet for Patients:  StrictlyIdeas.no Fact Sheet for Healthcare Providers: BankingDealers.co.za This test is not yet approved or cleared by  the [place] FDA and has been authorized for detection and/or diagnosis of SARS-CoV-2 by FDA under an Emergency Use Authorization (EUA).  This EUA will remain in effect (meaning this test can be used) for the duration of the COVID-19 declaration under Section 564(b)(1) of the Act, 21 U.S.C. section 360bbb-3(b)(1), unless the authorization is terminated or revoked sooner. Performed at [facility], [address] [place], [place] [address]   MRSA PCR Screening     Status: Abnormal   Collection Time: 10/27/18 12:13 PM   Specimen: Nasopharyngeal  Result Value Ref Range Status   MRSA by PCR POSITIVE (A) NEGATIVE Final    Comment:        The GeneXpert MRSA Assay (FDA approved for NASAL specimens only), is one component of a comprehensive MRSA colonization surveillance program. It is not intended to diagnose MRSA infection nor to guide or monitor treatment for MRSA infections. RESULT CALLED TO, READ BACK BY AND VERIFIED WITH: JONES @ [place] ON [id number] BY HENDERSON L. Performed at [facility], [address] [place], [place] [address]      Radiology Studies: Dg Chest 2 View  Result Date: 10/26/2018 CLINICAL DATA:  Shortness of breath.  Swelling in legs. EXAM: CHEST - 2 VIEW COMPARISON:  06/02/2018. FINDINGS: The heart is enlarged. There is mild vascular congestion. No consolidation or edema. No effusion or pneumothorax. Bones unremarkable. Similar appearance to priors. IMPRESSION: Cardiomegaly. Mild vascular congestion. No frank pulmonary edema or consolidation. Electronically Signed   By: Staci Righter M.D.   On: 10/26/2018 14:40   US Venous Img Lower Unilateral Left  Result Date: 10/26/2018 CLINICAL DATA:  Edema, pain x1 week, shortness of breath EXAM: LEFT LOWER EXTREMITY VENOUS DOPPLER ULTRASOUND TECHNIQUE: Gray-scale sonography with compression, as well as color and duplex ultrasound, were performed to evaluate the deep venous system from the level of the common femoral vein  through the popliteal and proximal calf veins. COMPARISON:  None FINDINGS: Normal compressibility of the common femoral, superficial femoral, and popliteal veins, as well as the proximal calf veins. No filling defects to suggest DVT on grayscale or color Doppler imaging. Doppler waveforms show normal direction of venous flow, normal respiratory phasicity and response to augmentation. There is subcutaneous edema in the calf. Visualized segments of the saphenous venous system normal in caliber and compressibility. Survey views of the contralateral common femoral vein are unremarkable. IMPRESSION: No femoropopliteal and no calf DVT in the visualized calf veins. If clinical symptoms are inconsistent or if there are persistent or worsening symptoms, further imaging (possibly involving the iliac veins) may be warranted. Electronically Signed   By: Lucrezia Europe M.D.   On: 10/26/2018 16:54    Scheduled Meds:  aspirin EC  81 mg Oral Daily   atorvastatin  20 mg Oral q1800   carvedilol  50 mg Oral BID WC   citalopram  20 mg Oral Daily   enoxaparin (LOVENOX) injection  50 mg  Subcutaneous Q24H   furosemide  20 mg Intravenous Once   furosemide  80 mg Intravenous Q12H   gabapentin  600 mg Oral TID   hydrALAZINE  100 mg Oral TID   insulin aspart  0-15 Units Subcutaneous TID WC   insulin glargine  28 Units Subcutaneous QHS   pantoprazole  40 mg Oral Daily   potassium chloride SA  20 mEq Oral Daily   sacubitril-valsartan  1 tablet Oral BID   traZODone  50 mg Oral QHS   Continuous Infusions:   LOS: 0 days    Time spent: 30 minutes    Barton Dubois, MD [facility] Pager [phone]  10/27/2018, 3:52 PM

## 2018-10-27 NOTE — Progress Notes (Signed)
Patient states she does not want to wear CPAP for tonight. RT informed patient if she changes her mind have RN contact RT.

## 2018-10-28 ENCOUNTER — Inpatient Hospital Stay (HOSPITAL_COMMUNITY): Payer: Medicaid Other

## 2018-10-28 DIAGNOSIS — I5021 Acute systolic (congestive) heart failure: Secondary | ICD-10-CM

## 2018-10-28 LAB — GLUCOSE, CAPILLARY
Glucose-Capillary: 222 mg/dL — ABNORMAL HIGH (ref 70–99)
Glucose-Capillary: 204 mg/dL — ABNORMAL HIGH (ref 70–99)
Glucose-Capillary: 224 mg/dL — ABNORMAL HIGH (ref 70–99)
Glucose-Capillary: 283 mg/dL — ABNORMAL HIGH (ref 70–99)

## 2018-10-28 LAB — ECHOCARDIOGRAM COMPLETE
Height: 66 in
Weight: 3774.28 oz

## 2018-10-28 MED ORDER — INSULIN GLARGINE 100 UNIT/ML ~~LOC~~ SOLN
30.0000 [IU] | Freq: Every day | SUBCUTANEOUS | Status: DC
  Administered 2018-10-28: 30 [IU] via SUBCUTANEOUS
  Filled 2018-10-28 (×2): qty 0.3

## 2018-10-28 NOTE — Progress Notes (Signed)
2 D echo completed 

## 2018-10-28 NOTE — Progress Notes (Signed)
PROGRESS NOTE    Chelsey Santiago  MAU:[id number] DOB: [birth year] DOA: 10/26/2018 PCP: Rosita Fire, MD     Brief Narrative:  43 y.o. female with medical history significant for systolic and diastolic CHF, diabetes mellitus, depression, hypertension, obstructive sleep apnea, presented to the ED with complaints of difficulty breathing of 1 week duration, generalized weakness and bilateral lower extremity swelling left worse than right over the past 3 weeks. Difficulty breathing is worse with exertion. She endorses stable two-pillow orthopnea.  She reports onset of whitish productive cough today.  No Fever no chills.  She feels she is accumulating fluid, abdomen is bloated And she reports increase in her weight up to 240 from baseline of 230.  Assessment & Plan: 1-acute on chronic combined diastolic and systolic heart failure -Weight on admission 240; at baseline 230 pounds. -Chest x-ray with vascular congestion and BNP elevated. -Continue daily weights, strict I's and O's, low-sodium diet and IV diuretics -Will also continue home carvedilol, Entresto and hydralazine. -Left lower extremity venous ultrasound negative for DVT -Continue to follow renal function and electrolytes closely -Continue to monitor on telemetry -Repeat 2D echo demonstrating ejection fraction of 56%, grade 2 diastolic dysfunction and left ventricle hypertrophy, unchanged from previous exam. -Still with signs of fluid overload on exam.  2-Essential hypertension -Continue current antihypertensive regimen -Patient educated about low-sodium diet -Follow vital signs.  3-Type 2 diabetes mellitus without complication (HCC) -Continue sliding scale insulin -Continue Lantus; dose adjusted to 30 units daily for better control of her blood sugar. -Follow CBGs.  4-class 2 obesity -Body mass index is 38.07 kg/m. -Low calorie diet, portion control and increase physical activity has been discussed with patient.   5-hyperlipidemia -Continue statins  6-depression/anxiety -No suicidal ideation or hallucination -Continue Celexa, Abilify and the use of Klonopin.  7-GERD -continue PPI  DVT prophylaxis: Lovenox Code Status: Full code Family Communication: No family at bedside. Disposition Plan: Remains inpatient, continue IV diuresis, follow daily weights, low-sodium diet and strict intake and output.  Consultants:   None  Procedures:   See below for x-ray reports  Echo: Positive left ventricular hypertrophy, positive diastolic dysfunction ejection fraction 45%.  Antimicrobials:  Anti-infectives (From admission, onward)   None      Subjective: Still with mild orthopnea symptoms, no chest pain, signs of fluid overload on exam.  Not requiring oxygen supplementation.  Objective: Vitals:   10/27/18 1500 10/27/18 2012 10/27/18 2115 10/28/18 0531  BP: (!) 151/101  (!) 159/111 132/80  Pulse:   79 83  Resp:   17 17  Temp:   97.9 F (36.6 C) 98 F (36.7 C)  TempSrc:   Oral Oral  SpO2:  96%  97%  Weight:    107 kg  Height:        Intake/Output Summary (Last 24 hours) at 10/28/2018 1258 Last data filed at 10/28/2018 0300 Gross per 24 hour  Intake 1000 ml  Output 700 ml  Net 300 ml   Filed Weights   10/26/18 1208 10/27/18 0534 10/28/18 0531  Weight: 108.9 kg 108 kg 107 kg    Examination: General exam: Alert, awake, oriented x 3; afebrile, no chest pain, no requiring oxygen supplementation.  Reports breathing is improving.  Still mild orthopnea and having significant swelling on her legs (even slowly getting better). Respiratory system: No using accessory muscles, normal respiratory effort; no frank crackles on exam. Cardiovascular system:RRR. No murmurs, rubs, gallops. Gastrointestinal system: Abdomen is obese, nondistended, soft and nontender. No organomegaly or masses felt.  Normal bowel sounds heard. Central nervous system: Alert and oriented. No focal neurological deficits.  Extremities: 2+ lower extremity edema bilaterally, no cyanosis or clubbing.  TED hose in place. Skin: No rashes, no petechiae. Psychiatry: Judgement and insight appear normal.  Patient with flat affect, reports intermittent episode of crying spells.  Data Reviewed: I have personally reviewed following labs and imaging studies  CBC: Recent Labs  Lab 10/26/18 1243  WBC 8.4  NEUTROABS 6.4  HGB 9.7*  HCT 32.5*  MCV 71.9*  PLT 564   Basic Metabolic Panel: Recent Labs  Lab 10/26/18 1243 10/26/18 1534 10/27/18 0600  NA 139  --  139  K 3.1*  --  3.8  CL 102  --  99  CO2 27  --  26  GLUCOSE 241*  --  253*  BUN 12  --  17  CREATININE 1.14*  --  1.09*  CALCIUM 9.1  --  9.0  MG  --  1.5*  --    GFR: Estimated Creatinine Clearance: 82.4 mL/min (A) (by C-G formula based on SCr of 1.09 mg/dL (H)).   Liver Function Tests: Recent Labs  Lab 10/26/18 1243  AST 21  ALT 14  ALKPHOS 79  BILITOT 0.5  PROT 7.4  ALBUMIN 3.6   Cardiac Enzymes: Recent Labs  Lab 10/26/18 1243 10/26/18 1739 10/27/18 0029  TROPONINI 0.04* 0.04* 0.03*   CBG: Recent Labs  Lab 10/27/18 1102 10/27/18 1655 10/27/18 2118 10/28/18 0715 10/28/18 1143  GLUCAP 319* 191* 275* 222* 204*   Urine analysis:    Component Value Date/Time   COLORURINE COLORLESS (A) 10/26/2018 1259   APPEARANCEUR CLEAR 10/26/2018 1259   LABSPEC 1.005 10/26/2018 1259   PHURINE 6.0 10/26/2018 1259   GLUCOSEU NEGATIVE 10/26/2018 1259   HGBUR NEGATIVE 10/26/2018 1259   BILIRUBINUR NEGATIVE 10/26/2018 1259   KETONESUR NEGATIVE 10/26/2018 1259   PROTEINUR NEGATIVE 10/26/2018 1259   UROBILINOGEN 0.2 04/29/2014 1115   NITRITE NEGATIVE 10/26/2018 1259   LEUKOCYTESUR NEGATIVE 10/26/2018 1259    Recent Results (from the past 240 hour(s))  SARS Coronavirus 2 [facility] order, Performed in [place] hospital lab)     Status: None   Collection Time: 10/26/18  3:10 PM   Specimen: Nasopharyngeal Swab  Result Value Ref Range  Status   SARS Coronavirus 2 NEGATIVE NEGATIVE Final    Comment: (NOTE) If result is NEGATIVE SARS-CoV-2 target nucleic acids are NOT DETECTED. The SARS-CoV-2 RNA is generally detectable in upper and lower  respiratory specimens during the acute phase of infection. The lowest  concentration of SARS-CoV-2 viral copies this assay can detect is 250  copies / mL. A negative result does not preclude SARS-CoV-2 infection  and should not be used as the sole basis for treatment or other  patient management decisions.  A negative result may occur with  improper specimen collection / handling, submission of specimen other  than nasopharyngeal swab, presence of viral mutation(s) within the  areas targeted by this assay, and inadequate number of viral copies  (<250 copies / mL). A negative result must be combined with clinical  observations, patient history, and epidemiological information. If result is POSITIVE SARS-CoV-2 target nucleic acids are DETECTED. The SARS-CoV-2 RNA is generally detectable in upper and lower  respiratory specimens dur ing the acute phase of infection.  Positive  results are indicative of active infection with SARS-CoV-2.  Clinical  correlation with patient history and other diagnostic information is  necessary to determine patient infection status.  Positive  results do  not rule out bacterial infection or co-infection with other viruses. If result is PRESUMPTIVE POSTIVE SARS-CoV-2 nucleic acids MAY BE PRESENT.   A presumptive positive result was obtained on the submitted specimen  and confirmed on repeat testing.  While 2019 novel coronavirus  (SARS-CoV-2) nucleic acids may be present in the submitted sample  additional confirmatory testing may be necessary for epidemiological  and / or clinical management purposes  to differentiate between  SARS-CoV-2 and other Sarbecovirus currently known to infect humans.  If clinically indicated additional testing with an alternate  test  methodology [phone]) is advised. The SARS-CoV-2 RNA is generally  detectable in upper and lower respiratory sp ecimens during the acute  phase of infection. The expected result is Negative. Fact Sheet for Patients:  StrictlyIdeas.no Fact Sheet for Healthcare Providers: BankingDealers.co.za This test is not yet approved or cleared by the [place] FDA and has been authorized for detection and/or diagnosis of SARS-CoV-2 by FDA under an Emergency Use Authorization (EUA).  This EUA will remain in effect (meaning this test can be used) for the duration of the COVID-19 declaration under Section 564(b)(1) of the Act, 21 U.S.C. section 360bbb-3(b)(1), unless the authorization is terminated or revoked sooner. Performed at [facility], [address], [place], [place] [address]   MRSA PCR Screening     Status: Abnormal   Collection Time: 10/27/18 12:13 PM   Specimen: Nasopharyngeal  Result Value Ref Range Status   MRSA by PCR POSITIVE (A) NEGATIVE Final    Comment:        The GeneXpert MRSA Assay (FDA approved for NASAL specimens only), is one component of a comprehensive MRSA colonization surveillance program. It is not intended to diagnose MRSA infection nor to guide or monitor treatment for MRSA infections. RESULT CALLED TO, READ BACK BY AND VERIFIED WITH: JONES @ [place] ON [id number] BY HENDERSON L. Performed at [facility], [address], [place], [place] [address]      Radiology Studies: Dg Chest 2 View  Result Date: 10/26/2018 CLINICAL DATA:  Shortness of breath.  Swelling in legs. EXAM: CHEST - 2 VIEW COMPARISON:  06/02/2018. FINDINGS: The heart is enlarged. There is mild vascular congestion. No consolidation or edema. No effusion or pneumothorax. Bones unremarkable. Similar appearance to priors. IMPRESSION: Cardiomegaly. Mild vascular congestion. No frank pulmonary edema or consolidation. Electronically Signed   By: Staci Righter M.D.   On: 10/26/2018 14:40   US Venous Img Lower Unilateral Left  Result Date: 10/26/2018 CLINICAL DATA:  Edema, pain x1 week, shortness of breath EXAM: LEFT LOWER EXTREMITY VENOUS DOPPLER ULTRASOUND TECHNIQUE: Gray-scale sonography with compression, as well as color and duplex ultrasound, were performed to evaluate the deep venous system from the level of the common femoral vein through the popliteal and proximal calf veins. COMPARISON:  None FINDINGS: Normal compressibility of the common femoral, superficial femoral, and popliteal veins, as well as the proximal calf veins. No filling defects to suggest DVT on grayscale or color Doppler imaging. Doppler waveforms show normal direction of venous flow, normal respiratory phasicity and response to augmentation. There is subcutaneous edema in the calf. Visualized segments of the saphenous venous system normal in caliber and compressibility. Survey views of the contralateral common femoral vein are unremarkable. IMPRESSION: No femoropopliteal and no calf DVT in the visualized calf veins. If clinical symptoms are inconsistent or if there are persistent or worsening symptoms, further imaging (possibly involving the iliac veins) may be warranted. Electronically Signed   By:  Lucrezia Europe M.D.   On: 10/26/2018 16:54    Scheduled Meds: . ARIPiprazole  10 mg Oral Daily  . aspirin EC  81 mg Oral Daily  . atorvastatin  20 mg Oral q1800  . carvedilol  50 mg Oral BID WC  . Chlorhexidine Gluconate Cloth  6 each Topical Q0600  . citalopram  20 mg Oral Daily  . clonazePAM  0.5 mg Oral Daily  . enoxaparin (LOVENOX) injection  50 mg Subcutaneous Q24H  . furosemide  20 mg Intravenous Once  . furosemide  80 mg Intravenous Q12H  . gabapentin  600 mg Oral TID  . hydrALAZINE  100 mg Oral TID  . insulin aspart  0-15 Units Subcutaneous TID WC  . insulin glargine  28 Units Subcutaneous QHS  . mupirocin ointment  1 application Nasal BID  . pantoprazole  40 mg Oral  Daily  . potassium chloride SA  20 mEq Oral Daily  . sacubitril-valsartan  1 tablet Oral BID  . traZODone  200 mg Oral QHS   Continuous Infusions:   LOS: 1 day    Time spent: 30 minutes  Barton Dubois, MD [facility] Pager [phone]  10/28/2018, 12:58 PM

## 2018-10-28 NOTE — Progress Notes (Signed)
Pt refuse to wear CPAP machine for the night. Pt has unit at bedside and instructed to call if she decides she wants to put it on.

## 2018-10-29 DIAGNOSIS — E669 Obesity, unspecified: Secondary | ICD-10-CM

## 2018-10-29 LAB — BASIC METABOLIC PANEL
Anion gap: 15 (ref 5–15)
BUN: 24 mg/dL — ABNORMAL HIGH (ref 6–20)
CO2: 27 mmol/L (ref 22–32)
Calcium: 8.7 mg/dL — ABNORMAL LOW (ref 8.9–10.3)
Chloride: 94 mmol/L — ABNORMAL LOW (ref 98–111)
Creatinine, Ser: 0.95 mg/dL (ref 0.44–1.00)
GFR calc Af Amer: >60 mL/min (ref >60)
GFR calc non Af Amer: >60 mL/min (ref >60)
Glucose, Bld: 217 mg/dL — ABNORMAL HIGH (ref 70–99)
Potassium: 3.6 mmol/L (ref 3.5–5.1)
Sodium: 136 mmol/L (ref 135–145)

## 2018-10-29 LAB — CBC
HCT: 35 % — ABNORMAL LOW (ref 36.0–46.0)
Hemoglobin: 10.2 g/dL — ABNORMAL LOW (ref 12.0–15.0)
MCH: 20.9 pg — ABNORMAL LOW (ref 26.0–34.0)
MCHC: 29.1 g/dL — ABNORMAL LOW (ref 30.0–36.0)
MCV: 71.7 fL — ABNORMAL LOW (ref 80.0–100.0)
Platelets: 304 10*3/uL (ref 150–400)
RBC: 4.88 MIL/uL (ref 3.87–5.11)
RDW: 17.3 % — ABNORMAL HIGH (ref 11.5–15.5)
WBC: 9.2 10*3/uL (ref 4.0–10.5)
nRBC: 0 % (ref 0.0–0.2)

## 2018-10-29 LAB — GLUCOSE, CAPILLARY
Glucose-Capillary: 206 mg/dL — ABNORMAL HIGH (ref 70–99)
Glucose-Capillary: 190 mg/dL — ABNORMAL HIGH (ref 70–99)

## 2018-10-29 MED ORDER — TRAZODONE HCL 100 MG PO TABS: 200.0000 mg | ORAL_TABLET | Freq: Every day | ORAL | Status: AC

## 2018-10-29 MED ORDER — TORSEMIDE 20 MG PO TABS: 40.0000 mg | ORAL_TABLET | Freq: Two times a day (BID) | ORAL | 0 refills | Status: DC

## 2018-10-29 MED ORDER — ARIPIPRAZOLE 10 MG PO TABS: 10.0000 mg | ORAL_TABLET | Freq: Every day | ORAL | Status: DC

## 2018-10-29 NOTE — Discharge Summary (Signed)
Physician Discharge Summary  Chelsey Santiago CVE:[id number] DOB: 05/31/1975 DOA: 10/26/2018  PCP: Rosita Fire, MD  Admit date: 10/26/2018 Discharge date: 10/29/2018  Time spent: 35 minutes  Recommendations for Outpatient Follow-up:  1. Repeat basic metabolic panel to follow electrolytes and renal function 2. Close follow-up to patient's volume status with further adjustment of diuretic therapy as needed 3. Outpatient follow-up with cardiology service to further adjust heart failure medication.   Discharge Diagnoses:  Active Problems:   Essential hypertension   Type 2 diabetes mellitus without complication (HCC)   Acute on chronic combined systolic and diastolic CHF, NYHA class 4 (HCC)   Depression   Acute exacerbation of CHF (congestive heart failure) ([place])   Class 2 obesity Obstructive sleep apnea   Discharge Condition: Stable and improved.  Discharged home with instructions to follow-up with PCP and cardiology service.  Diet recommendation: Heart healthy modified carbohydrate diet.  Filed Weights   10/27/18 0534 10/28/18 0531 10/29/18 0500  Weight: 108 kg 107 kg 103.2 kg    History of present illness:  As per H&P written by Dr. Denton Santiago on 10/26/2018 43 y.o. female with medical history significant for systolic and diastolic CHF, diabetes mellitus, depression, hypertension, obstructive sleep apnea, presented to the ED with complaints of difficulty breathing of 1 week duration, generalized weakness and bilateral lower extremity swelling left worse than right over the past 3 weeks. Difficulty breathing is worse with exertion. She endorses stable two-pillow orthopnea.  She reports onset of whitish productive cough today.  No Fever no chills.  She feels she is accumulating fluid, abdomen is bloated from fluids. She has increased her daily total torsemide dose from 60mg  to 80 mg without improvement in symptoms. She reports compliance with a low-salt diet, compliance with her Lasix and  carvedilol.  She denies chest pain.  Follows with cardiology but has not followed up with cardiology since December.  She reports her stable weight is about 230 to 235 Lbs.  ED Course: Blood pressure systolic 025E to 527P, O2 sats greater than 94% on room air.  Two-view chest x-ray mild vascular congestion, no frank pulmonary edema or consolidation.  EKG, T wave changes in 1 and aVL. Troponin chronically elevated 0.04.  Potassium 3.1.  BNP 778.  Patient was given 40 mg IV Lasix.  Hospitalist to admit for decompensated CHF.  Hospital Course:  1-acute on chronic combined diastolic and systolic heart failure -Weight on admission 240; at baseline 230 pounds. -Chest x-ray with vascular congestion and BNP elevated. -Continue daily weights and low-sodium diet. -Will also continue home carvedilol, Entresto and hydralazine. -Patient discharge with adjusted dose of Demadex to 40 mg twice a day. -Left lower extremity venous ultrasound negative for DVT -Continue to follow renal function and electrolytes closely -Repeat 2D echo demonstrating ejection fraction of 82%, grade 2 diastolic dysfunction and left ventricle hypertrophy, unchanged from previous exam. -Patient weight at discharge 226 pounds.  2-Essential hypertension -Continue current antihypertensive regimen -Patient educated about low-sodium diet  3-Type 2 diabetes mellitus without complication ([place]) -Patient encouraged to follow modified carbohydrate diet -Resume home hypoglycemic regimen -Outpatient follow-up with PCP to further adjust medications as required.  4-class 2 obesity -Body mass index is 38.07 kg/m. -Low calorie diet, portion control and increase physical activity has been discussed with patient.  5-hyperlipidemia -Continue statins  6-depression/anxiety -No suicidal ideation or hallucination -Continue Celexa, Abilify and the use of Klonopin.  7-GERD -continue PPI  8-obstructive sleep apnea -Continue CPAP  nightly. -Patient advised to lose weight.  Procedures:  See below for x-ray reports  2D echo:  Ejection fraction 45%; left ventricle hypertrophy and impaired diastolic dysfunction (unchanged from previous study).  Consultations:  None  Discharge Exam: Vitals:   10/28/18 2156 10/29/18 0553  BP: (!) 145/96 (!) 136/97  Pulse: 79 77  Resp: 17 16  Temp: 98.3 F (36.8 C) 97.6 F (36.4 C)  SpO2: 95% 92%    General: Alert, awake oriented x3; afebrile no chest pain or shortness of breath.  Eager to go home and in no acute distress.  Patient denies orthopnea. Cardiovascular: S1 and S2, no rubs, no gallops, no murmurs. Respiratory: Clear to auscultation bilaterally. Abdomen: Obese, soft, nontender, nondistended, positive bowel sounds Extremities: Trace to 1+ edema appreciated bilaterally.  Patient reports very close to her baseline.  Discharge Instructions   Discharge Instructions    (HEART FAILURE PATIENTS) Call MD:  Anytime you have any of the following symptoms: 1) 3 pound weight gain in 24 hours or 5 pounds in 1 week 2) shortness of breath, with or without a dry hacking cough 3) swelling in the hands, feet or stomach 4) if you have to sleep on extra pillows at night in order to breathe.   Complete by: As directed    Diet - low sodium heart healthy   Complete by: As directed    Discharge instructions   Complete by: As directed    Take medications as prescribed Maintain adequate hydration (no more than 2 L daily) Follow heart healthy/low-sodium diet (no more than 2 g of sodium daily) Check your weight on daily basis Arrange follow-up with PCP in 10 days Follow-up with cardiology service in 2 weeks     Allergies as of 10/29/2018      Reactions   Diclofenac Swelling   AND POSSIBLE SYNCOPE; tolerates ibuprofen per pt   Tramadol Nausea And Vomiting, Nausea Only   Itching (12/21); tolerates ibuprofen per pt   Vicodin [hydrocodone-acetaminophen] Itching, Nausea Only    Hydrocodone Bitartrate Er Itching      Medication List    STOP taking these medications   spironolactone 50 MG tablet Commonly known as: ALDACTONE     TAKE these medications   albuterol 108 (90 Base) MCG/ACT inhaler Commonly known as: VENTOLIN HFA Inhale 1 puff into the lungs every 6 (six) hours as needed for wheezing or shortness of breath.   ARIPiprazole 10 MG tablet Commonly known as: ABILIFY Take 1 tablet (10 mg total) by mouth daily. Start taking on: October 30, 2018   aspirin EC 81 MG tablet Take 81 mg by mouth daily.   atorvastatin 20 MG tablet Commonly known as: LIPITOR Take 1 tablet (20 mg total) by mouth daily at 6 PM.   budesonide-formoterol 80-4.5 MCG/ACT inhaler Commonly known as: Symbicort Inhale 2 puffs into the lungs 2 (two) times daily.   carvedilol 25 MG tablet Commonly known as: COREG Take 2 tablets (50 mg total) by mouth 2 (two) times daily with a meal.   CeleXA 20 MG tablet Generic drug: citalopram Take 20 mg by mouth daily.   gabapentin 600 MG tablet Commonly known as: NEURONTIN Take 600 mg by mouth 3 (three) times daily.   hydrALAZINE 100 MG tablet Commonly known as: APRESOLINE Take 1 tablet (100 mg total) by mouth 3 (three) times daily.   insulin glargine 100 UNIT/ML injection Commonly known as: LANTUS Inject 30 Units at bedtime into the skin.   insulin lispro 100 UNIT/ML injection Commonly known as: HUMALOG Inject 15 Units  into the skin 3 (three) times daily before meals.   Ipratropium-Albuterol 20-100 MCG/ACT Aers respimat Commonly known as: Combivent Respimat Inhale 1 puff into the lungs every 6 (six) hours as needed for wheezing or shortness of breath.   metFORMIN 500 MG tablet Commonly known as: GLUCOPHAGE Take 500 mg 2 (two) times daily with a meal by mouth.   nitroGLYCERIN 0.4 MG SL tablet Commonly known as: Nitrostat Place 1 tablet (0.4 mg total) under the tongue every 5 (five) minutes as needed for chest pain.    omeprazole 40 MG capsule Commonly known as: PRILOSEC Take 40 mg by mouth daily.   ondansetron 4 MG tablet Commonly known as: ZOFRAN Take 4 mg by mouth daily as needed for nausea or vomiting.   potassium chloride SA 20 MEQ tablet Commonly known as: K-DUR Take 1 tablet (20 mEq total) by mouth daily.   pyridostigmine 60 MG tablet Commonly known as: MESTINON Take 60 mg by mouth every 8 (eight) hours.   sacubitril-valsartan 97-103 MG Commonly known as: Entresto Take 1 tablet by mouth 2 (two) times daily.   torsemide 20 MG tablet Commonly known as: Demadex Take 2 tablets (40 mg total) by mouth 2 (two) times daily. Please take 40 mg oral every morning and 20 mg every evening. What changed:   how much to take  how to take this  when to take this   traZODone 100 MG tablet Commonly known as: DESYREL Take 2 tablets (200 mg total) by mouth at bedtime. What changed:   medication strength  how much to take      Allergies  Allergen Reactions  . Diclofenac Swelling    AND POSSIBLE SYNCOPE; tolerates ibuprofen per pt  . Tramadol Nausea And Vomiting and Nausea Only    Itching (12/21); tolerates ibuprofen per pt  . Vicodin [Hydrocodone-Acetaminophen] Itching and Nausea Only  . Hydrocodone Bitartrate Er Itching   Follow-up Information    Rosita Fire, MD. Schedule an appointment as soon as possible for a visit in 10 day(s).   Specialty: Internal Medicine Contact information: [place] [address] [phone]        Arnoldo Lenis, MD. Schedule an appointment as soon as possible for a visit in 2 week(s).   Specialty: Cardiology Contact information: [address] [place] [address] [phone]           The results of significant diagnostics from this hospitalization (including imaging, microbiology, ancillary and laboratory) are listed below for reference.    Significant Diagnostic Studies: Dg Chest 2 View  Result Date:  10/26/2018 CLINICAL DATA:  Shortness of breath.  Swelling in legs. EXAM: CHEST - 2 VIEW COMPARISON:  06/02/2018. FINDINGS: The heart is enlarged. There is mild vascular congestion. No consolidation or edema. No effusion or pneumothorax. Bones unremarkable. Similar appearance to priors. IMPRESSION: Cardiomegaly. Mild vascular congestion. No frank pulmonary edema or consolidation. Electronically Signed   By: Staci Righter M.D.   On: 10/26/2018 14:40   US Venous Img Lower Unilateral Left  Result Date: 10/26/2018 CLINICAL DATA:  Edema, pain x1 week, shortness of breath EXAM: LEFT LOWER EXTREMITY VENOUS DOPPLER ULTRASOUND TECHNIQUE: Gray-scale sonography with compression, as well as color and duplex ultrasound, were performed to evaluate the deep venous system from the level of the common femoral vein through the popliteal and proximal calf veins. COMPARISON:  None FINDINGS: Normal compressibility of the common femoral, superficial femoral, and popliteal veins, as well as the proximal calf veins. No filling  defects to suggest DVT on grayscale or color Doppler imaging. Doppler waveforms show normal direction of venous flow, normal respiratory phasicity and response to augmentation. There is subcutaneous edema in the calf. Visualized segments of the saphenous venous system normal in caliber and compressibility. Survey views of the contralateral common femoral vein are unremarkable. IMPRESSION: No femoropopliteal and no calf DVT in the visualized calf veins. If clinical symptoms are inconsistent or if there are persistent or worsening symptoms, further imaging (possibly involving the iliac veins) may be warranted. Electronically Signed   By: Lucrezia Europe M.D.   On: 10/26/2018 16:54    Microbiology: Recent Results (from the past 240 hour(s))  SARS Coronavirus 2 [facility] order, Performed in [facility] hospital lab)     Status: None   Collection Time: 10/26/18  3:10 PM   Specimen: Nasopharyngeal Swab  Result Value  Ref Range Status   SARS Coronavirus 2 NEGATIVE NEGATIVE Final    Comment: (NOTE) If result is NEGATIVE SARS-CoV-2 target nucleic acids are NOT DETECTED. The SARS-CoV-2 RNA is generally detectable in upper and lower  respiratory specimens during the acute phase of infection. The lowest  concentration of SARS-CoV-2 viral copies this assay can detect is 250  copies / mL. A negative result does not preclude SARS-CoV-2 infection  and should not be used as the sole basis for treatment or other  patient management decisions.  A negative result may occur with  improper specimen collection / handling, submission of specimen other  than nasopharyngeal swab, presence of viral mutation(s) within the  areas targeted by this assay, and inadequate number of viral copies  (<250 copies / mL). A negative result must be combined with clinical  observations, patient history, and epidemiological information. If result is POSITIVE SARS-CoV-2 target nucleic acids are DETECTED. The SARS-CoV-2 RNA is generally detectable in upper and lower  respiratory specimens dur ing the acute phase of infection.  Positive  results are indicative of active infection with SARS-CoV-2.  Clinical  correlation with patient history and other diagnostic information is  necessary to determine patient infection status.  Positive results do  not rule out bacterial infection or co-infection with other viruses. If result is PRESUMPTIVE POSTIVE SARS-CoV-2 nucleic acids MAY BE PRESENT.   A presumptive positive result was obtained on the submitted specimen  and confirmed on repeat testing.  While 2019 novel coronavirus  (SARS-CoV-2) nucleic acids may be present in the submitted sample  additional confirmatory testing may be necessary for epidemiological  and / or clinical management purposes  to differentiate between  SARS-CoV-2 and other Sarbecovirus currently known to infect humans.  If clinically indicated additional testing with an  alternate test  methodology [phone]) is advised. The SARS-CoV-2 RNA is generally  detectable in upper and lower respiratory sp ecimens during the acute  phase of infection. The expected result is Negative. Fact Sheet for Patients:  StrictlyIdeas.no Fact Sheet for Healthcare Providers: BankingDealers.co.za This test is not yet approved or cleared by the [place] FDA and has been authorized for detection and/or diagnosis of SARS-CoV-2 by FDA under an Emergency Use Authorization (EUA).  This EUA will remain in effect (meaning this test can be used) for the duration of the COVID-19 declaration under Section 564(b)(1) of the Act, 21 U.S.C. section 360bbb-3(b)(1), unless the authorization is terminated or revoked sooner. Performed at [facility], [address] [place], [place] [address]   MRSA PCR Screening     Status: Abnormal   Collection Time: 10/27/18 12:13 PM  Specimen: Nasopharyngeal  Result Value Ref Range Status   MRSA by PCR POSITIVE (A) NEGATIVE Final    Comment:        The GeneXpert MRSA Assay (FDA approved for NASAL specimens only), is one component of a comprehensive MRSA colonization surveillance program. It is not intended to diagnose MRSA infection nor to guide or monitor treatment for MRSA infections. RESULT CALLED TO, READ BACK BY AND VERIFIED WITH: JONES @ [place] ON [id number] BY HENDERSON L. Performed at [facility], [address], [place], [place] [address]      Labs: Basic Metabolic Panel: Recent Labs  Lab 10/26/18 1243 10/26/18 1534 10/27/18 0600 10/29/18 0755  NA 139  --  139 136  K 3.1*  --  3.8 3.6  CL 102  --  99 94*  CO2 27  --  26 27  GLUCOSE 241*  --  253* 217*  BUN 12  --  17 24*  CREATININE 1.14*  --  1.09* 0.95  CALCIUM 9.1  --  9.0 8.7*  MG  --  1.5*  --   --    Liver Function Tests: Recent Labs  Lab 10/26/18 1243  AST 21  ALT 14  ALKPHOS 79  BILITOT 0.5  PROT 7.4  ALBUMIN  3.6   CBC: Recent Labs  Lab 10/26/18 1243 10/29/18 0650  WBC 8.4 9.2  NEUTROABS 6.4  --   HGB 9.7* 10.2*  HCT 32.5* 35.0*  MCV 71.9* 71.7*  PLT 245 304   Cardiac Enzymes: Recent Labs  Lab 10/26/18 1243 10/26/18 1739 10/27/18 0029  TROPONINI 0.04* 0.04* 0.03*   BNP: BNP (last 3 results) Recent Labs    05/14/18 0432 06/02/18 0405 10/26/18 1243  BNP 205.0* 660.0* 778.0*    CBG: Recent Labs  Lab 10/28/18 1143 10/28/18 1648 10/28/18 2005 10/29/18 0745 10/29/18 1140  GLUCAP 204* 224* 283* 206* 190*    Signed:  Barton Dubois MD.  [facility] 10/29/2018, 12:08 PM

## 2018-10-29 NOTE — Progress Notes (Signed)
IV, tele removed. D/C instruction reviewed. Verbalized understanding. Stable patient transported to private vehicle via wheelchair.

## 2018-12-05 ENCOUNTER — Other Ambulatory Visit: Payer: Medicaid Other | Admitting: Obstetrics and Gynecology

## 2019-01-02 ENCOUNTER — Other Ambulatory Visit: Payer: Self-pay

## 2019-01-02 ENCOUNTER — Emergency Department (HOSPITAL_COMMUNITY)
Admission: EM | Admit: 2019-01-02 | Discharge: 2019-01-02 | Disposition: A | Discharge status: Home / Self Care | Payer: Medicaid Other | Attending: Emergency Medicine | Admitting: Emergency Medicine

## 2019-01-02 ENCOUNTER — Emergency Department (HOSPITAL_COMMUNITY): Payer: Medicaid Other

## 2019-01-02 ENCOUNTER — Encounter (HOSPITAL_COMMUNITY): Payer: Self-pay

## 2019-01-02 DIAGNOSIS — I110 Hypertensive heart disease with heart failure: Secondary | ICD-10-CM | POA: Diagnosis not present

## 2019-01-02 DIAGNOSIS — R0602 Shortness of breath: Secondary | ICD-10-CM | POA: Diagnosis present

## 2019-01-02 DIAGNOSIS — E119 Type 2 diabetes mellitus without complications: Secondary | ICD-10-CM | POA: Insufficient documentation

## 2019-01-02 DIAGNOSIS — Z20828 Contact with and (suspected) exposure to other viral communicable diseases: Secondary | ICD-10-CM | POA: Diagnosis not present

## 2019-01-02 DIAGNOSIS — Z79899 Other long term (current) drug therapy: Secondary | ICD-10-CM | POA: Diagnosis not present

## 2019-01-02 DIAGNOSIS — Z7982 Long term (current) use of aspirin: Secondary | ICD-10-CM | POA: Insufficient documentation

## 2019-01-02 DIAGNOSIS — Z794 Long term (current) use of insulin: Secondary | ICD-10-CM | POA: Diagnosis not present

## 2019-01-02 DIAGNOSIS — I252 Old myocardial infarction: Secondary | ICD-10-CM | POA: Insufficient documentation

## 2019-01-02 DIAGNOSIS — I509 Heart failure, unspecified: Secondary | ICD-10-CM

## 2019-01-02 DIAGNOSIS — E876 Hypokalemia: Secondary | ICD-10-CM

## 2019-01-02 DIAGNOSIS — I5043 Acute on chronic combined systolic (congestive) and diastolic (congestive) heart failure: Secondary | ICD-10-CM | POA: Diagnosis not present

## 2019-01-02 DIAGNOSIS — R109 Unspecified abdominal pain: Secondary | ICD-10-CM

## 2019-01-02 LAB — COMPREHENSIVE METABOLIC PANEL
ALT: 15 U/L (ref 0–44)
AST: 20 U/L (ref 15–41)
Albumin: 3.7 g/dL (ref 3.5–5.0)
Alkaline Phosphatase: 80 U/L (ref 38–126)
Anion gap: 10 (ref 5–15)
BUN: 17 mg/dL (ref 6–20)
CO2: 29 mmol/L (ref 22–32)
Calcium: 9.1 mg/dL (ref 8.9–10.3)
Chloride: 97 mmol/L — ABNORMAL LOW (ref 98–111)
Creatinine, Ser: 0.91 mg/dL (ref 0.44–1.00)
GFR calc Af Amer: >60 mL/min (ref >60)
GFR calc non Af Amer: >60 mL/min (ref >60)
Glucose, Bld: 211 mg/dL — ABNORMAL HIGH (ref 70–99)
Potassium: 3.6 mmol/L (ref 3.5–5.1)
Sodium: 136 mmol/L (ref 135–145)
Total Bilirubin: 0.6 mg/dL (ref 0.3–1.2)
Total Protein: 8.1 g/dL (ref 6.5–8.1)

## 2019-01-02 LAB — BRAIN NATRIURETIC PEPTIDE: B Natriuretic Peptide: 1507 pg/mL — ABNORMAL HIGH (ref 0.0–100.0)

## 2019-01-02 LAB — CBC
HCT: 33.9 % — ABNORMAL LOW (ref 36.0–46.0)
Hemoglobin: 10.2 g/dL — ABNORMAL LOW (ref 12.0–15.0)
MCH: 22.3 pg — ABNORMAL LOW (ref 26.0–34.0)
MCHC: 30.1 g/dL (ref 30.0–36.0)
MCV: 74.2 fL — ABNORMAL LOW (ref 80.0–100.0)
Platelets: 227 10*3/uL (ref 150–400)
RBC: 4.57 MIL/uL (ref 3.87–5.11)
RDW: 18.6 % — ABNORMAL HIGH (ref 11.5–15.5)
WBC: 9.7 10*3/uL (ref 4.0–10.5)
nRBC: 0 % (ref 0.0–0.2)

## 2019-01-02 LAB — URINALYSIS, ROUTINE W REFLEX MICROSCOPIC
Bacteria, UA: NONE SEEN
Bilirubin Urine: NEGATIVE
Glucose, UA: NEGATIVE mg/dL
Hgb urine dipstick: NEGATIVE
Ketones, ur: NEGATIVE mg/dL
Nitrite: NEGATIVE
Protein, ur: NEGATIVE mg/dL
Specific Gravity, Urine: 1.005 (ref 1.005–1.030)
pH: 7 (ref 5.0–8.0)

## 2019-01-02 LAB — TROPONIN I (HIGH SENSITIVITY): Troponin I (High Sensitivity): 19 ng/L — ABNORMAL HIGH (ref <18)

## 2019-01-02 MED ORDER — FUROSEMIDE 10 MG/ML IJ SOLN
80.0000 mg | Freq: Once | INTRAMUSCULAR | Status: AC
  Administered 2019-01-02: 13:00:00 80 mg via INTRAMUSCULAR
  Filled 2019-01-02: qty 8

## 2019-01-02 MED ORDER — POTASSIUM CHLORIDE CRYS ER 20 MEQ PO TBCR: 20.0000 meq | EXTENDED_RELEASE_TABLET | Freq: Two times a day (BID) | ORAL | 0 refills | Status: DC

## 2019-01-02 MED ORDER — METHOCARBAMOL 500 MG PO TABS: 500.0000 mg | ORAL_TABLET | Freq: Three times a day (TID) | ORAL | 0 refills | Status: DC | PRN

## 2019-01-02 NOTE — ED Provider Notes (Addendum)
[facility] EMERGENCY DEPARTMENT Provider Note   CSN: [id number] Arrival date & time: 01/02/19  1035    History   Chief Complaint Chief Complaint  Patient presents with  . Shortness of Breath    HPI Chelsey Santiago is a 43 y.o. female.     HPI Patient presents with left flank/back pain shortness of breath and occasional cough.  Began a couple days ago.  States she was riding in the car and felt an acute spasm in her back.  Went to her left flank.  Has been somewhat severe.  States she cannot find a comfortable area.  However also feels somewhat short of breath.  No fevers.  Has a nonproductive cough at times.  No dysuria.  At times has some chest pain.  History of CHF.  No hemoptysis.  No swelling in her legs.  States she does not weigh her self at home.  States her legs and felt a little tighter so she took an extra dose of 1 of her fluid pills.  Pain is dull. Past Medical History:  Diagnosis Date  . Anemia    H&H of 10.6/33 and 07/2008 and 11.9/35 and 09/2010  . Anxiety   . CHF (congestive heart failure) ([place])    a. EF 40-45% by echo in 12/2015 with cath showing normal cors  . Depression with anxiety   . Diabetes mellitus without complication ([place])   . Enlarged heart   . Hypertension   . Hypertensive heart disease 2009   Pulmonary edema postpartum; mild to moderate mitral regurgitation when hospitalized for CHF in 2009; Echocardiogram in 12/2009-no MR and normal EF; normal CXR in 09/2010  . Migraine headache   . Miscarriage 03/19/2013  . Obesity 04/16/2009  . Osteoarthritis, knee 03/29/2011  . Preeclampsia   . Pregnant   . Pulmonary edema   . Sleep apnea   . Threatened abortion in early pregnancy 03/15/2013    Patient Active Problem List   Diagnosis Date Noted  . Class 2 obesity   . Acute exacerbation of CHF (congestive heart failure) ([place]) 10/26/2018  . Chronic combined systolic and diastolic CHF (congestive heart failure) ([place]) 06/02/2018  . Uncontrolled type 2  diabetes mellitus with hyperglycemia ([place]) 06/02/2018  . Influenza B 05/13/2018  . Hypomagnesemia 05/12/2018  . Headache 05/12/2018  . Upper respiratory tract infection   . HCAP (healthcare-associated pneumonia)   . Constipation 10/17/2017  . Bad headache   . CHF exacerbation ([place]) 09/29/2017  . Iron deficiency anemia 05/16/2017  . Vitamin D deficiency 11/25/2016  . Iron deficiency 11/25/2016  . History of acute myocardial infarction 10/05/2016  . CHF (congestive heart failure) ([place]) 05/06/2016  . Diabetes mellitus with complication ([place]) 00/86/7619  . Leukocytosis 05/06/2016  . Neuropathy 05/06/2016  . Depression 04/16/2016  . Chronic tension-type headache, not intractable 04/16/2016  . AKI (acute kidney injury) ([place])   . Hyperkalemia   . Nonischemic cardiomyopathy ([place])   . Acute on chronic combined systolic and diastolic ACC/AHA stage C congestive heart failure ([place]) 04/03/2016  . Acute on chronic combined systolic and diastolic CHF, NYHA class 4 ([place]) 04/03/2016  . Hypertensive emergency 04/03/2016  . Cardiomyopathy due to hypertension ([place]) 12/22/2015  . Normal coronary arteries 12/22/2015  . Troponin level elevated 12/22/2015  . NSTEMI (non-ST elevated myocardial infarction) ([place]) 12/20/2015  . Dental infection 10/10/2015  . Chest pain 09/05/2015  . Systolic CHF, chronic ([place]) 09/05/2015  . LLQ pain   . Type 2 diabetes mellitus without complication ([place]) 50/93/2671  .  Essential hypertension   . 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 Hypertensive urgency 04/22/2014  . Acute CHF ([place]) 04/22/2014  . S/P cesarean section 04/11/2014  . Acute pulmonary edema ([place]) 04/11/2014  . Postoperative anemia 04/11/2014  . Elevated serum creatinine 04/11/2014  . Preeclampsia, severe 04/09/2014  . Pre-eclampsia superimposed on chronic hypertension, antepartum 04/08/2014  . Dyspnea 04/08/2014  . Polyhydramnios in third trimester, antepartum 03/14/2014  . Abnormal maternal glucose  tolerance, antepartum 03/11/2014  . High-risk pregnancy 03/11/2014  . Pre-existing essential hypertension complicating pregnancy 40/97/3532  . Impaired glucose tolerance during pregnancy, antepartum 11/27/2013  . Leiomyoma of uterus 11/22/2013  . History of gestational diabetes in prior pregnancy, currently pregnant in first trimester 11/22/2013  . Hx of preeclampsia, prior pregnancy, currently pregnant 11/22/2013  . Short interval between pregnancies affecting pregnancy, antepartum 11/22/2013  . Supervision of high-risk pregnancy of elderly primigravida (>= 40 years old at delivery), third trimester 11/22/2013  . Miscarriage 03/19/2013  . Major depressive disorder, single episode, unspecified 09/27/2011  . Hypertension   . Hypertensive cardiovascular disease   . Microcytic anemia   . Osteoarthrosis involving lower leg 03/29/2011  . Hypokalemia 12/12/2009  . OSA on CPAP 12/09/2009  . Morbid obesity ([place]) 04/16/2009    Past Surgical History:  Procedure Laterality Date  . BREAST REDUCTION SURGERY  2002  . CARDIAC CATHETERIZATION N/A 12/22/2015   Procedure: Left Heart Cath and Coronary Angiography;  Surgeon: Peter M Martinique, MD;  Location: [place] [facility];  Service: Cardiovascular;  Laterality: N/A;  . CESAREAN SECTION N/A 04/09/2014   Procedure: CESAREAN SECTION;  Surgeon: Mora Bellman, MD;  Location: [place] [facility];  Service: Obstetrics;  Laterality: N/A;  . CHOLECYSTECTOMY       OB History    Gravida  11   Para  6   Term  5   Preterm  1   AB  5   Living  6     SAB  3   TAB  2   Ectopic      Multiple  0   Live Births  6            Home Medications    Prior to Admission medications   Medication Sig Start Date End Date Taking? Authorizing Provider  albuterol (PROVENTIL HFA;VENTOLIN HFA) 108 (90 Base) MCG/ACT inhaler Inhale 1 puff into the lungs every 6 (six) hours as needed for wheezing or shortness of breath.    [provider]  ARIPiprazole (ABILIFY)  10 MG tablet Take 1 tablet (10 mg total) by mouth daily. 10/30/18   Barton Dubois, MD  aspirin EC 81 MG tablet Take 81 mg by mouth daily.    [provider]  atorvastatin (LIPITOR) 20 MG tablet Take 1 tablet (20 mg total) by mouth daily at 6 PM. 06/03/18   Tat, Shanon Brow, MD  budesonide-formoterol [facility]) 80-4.5 MCG/ACT inhaler Inhale 2 puffs into the lungs 2 (two) times daily. 05/04/18   Manuella Ghazi, Pratik D, DO  carvedilol (COREG) 25 MG tablet Take 2 tablets (50 mg total) by mouth 2 (two) times daily with a meal. 06/03/18   Tat, Shanon Brow, MD  citalopram (CELEXA) 20 MG tablet Take 20 mg by mouth daily. 08/19/17   [provider]  gabapentin (NEURONTIN) 600 MG tablet Take 600 mg by mouth 3 (three) times daily.     [provider]  hydrALAZINE (APRESOLINE) 100 MG tablet Take 1 tablet (100 mg total) by mouth 3 (three) times daily. 06/03/18   Orson Eva, MD  insulin glargine (LANTUS) 100 UNIT/ML injection Inject 30 Units at bedtime into the skin.     [provider]  insulin lispro (HUMALOG) 100 UNIT/ML injection Inject 15 Units into the skin 3 (three) times daily before meals.     [provider]  Ipratropium-Albuterol (COMBIVENT RESPIMAT) 20-100 MCG/ACT AERS respimat Inhale 1 puff into the lungs every 6 (six) hours as needed for wheezing or shortness of breath. 05/04/18   Manuella Ghazi, Pratik D, DO  metFORMIN (GLUCOPHAGE) 500 MG tablet Take 500 mg 2 (two) times daily with a meal by mouth.    [provider]  nitroGLYCERIN (NITROSTAT) 0.4 MG SL tablet Place 1 tablet (0.4 mg total) under the tongue every 5 (five) minutes as needed for chest pain. 09/05/15   Kathie Dike, MD  omeprazole (PRILOSEC) 40 MG capsule Take 40 mg by mouth daily.    [provider]  ondansetron (ZOFRAN) 4 MG tablet Take 4 mg by mouth daily as needed for nausea or vomiting.    [provider]  potassium chloride SA (K-DUR) 20 MEQ tablet Take 1 tablet (20 mEq total) by mouth 2  (two) times daily. 01/02/19   Davonna Belling, MD  pyridostigmine (MESTINON) 60 MG tablet Take 60 mg by mouth every 8 (eight) hours.    [provider]  sacubitril-valsartan (ENTRESTO) 97-103 MG Take 1 tablet by mouth 2 (two) times daily. 04/09/16   Arrien, Jimmy Picket, MD  torsemide (DEMADEX) 20 MG tablet Take 2 tablets (40 mg total) by mouth 2 (two) times daily. Please take 40 mg oral every morning and 20 mg every evening. 10/29/18 12/28/18  Barton Dubois, MD  traZODone (DESYREL) 100 MG tablet Take 2 tablets (200 mg total) by mouth at bedtime. 10/29/18   Barton Dubois, MD    Family History Family History  Problem Relation Age of Onset  . Diabetes Mother 92  . Heart disease Mother   . Hyperlipidemia Paternal Grandfather   . Hypertension Paternal Grandfather   . Heart disease Father   . Hypertension Father   . Heart disease Maternal Grandmother 60  . ADD / ADHD Son   . Hypertension Maternal Uncle   . Heart attack Brother   . Sudden death Neg Hx   . Colon cancer Neg Hx   . Celiac disease Neg Hx   . Inflammatory bowel disease Neg Hx     Social History Social History   Tobacco Use  . Smoking status: Never Smoker  . Smokeless tobacco: Never Used  Substance Use Topics  . Alcohol use: Yes    Comment: occ  . Drug use: No     Allergies   Diclofenac, Tramadol, Vicodin [hydrocodone-acetaminophen], and Hydrocodone bitartrate er   Review of Systems Review of Systems  Constitutional: Negative for appetite change.  Respiratory: Positive for cough and shortness of breath.   Cardiovascular: Negative for chest pain.  Gastrointestinal: Negative for abdominal pain.  Genitourinary: Positive for flank pain.  Musculoskeletal: Negative for back pain.  Neurological: Negative for weakness.  Psychiatric/Behavioral: Negative for confusion.     Physical Exam Updated Vital Signs BP (!) 116/56   Pulse 79   Temp 98.2 F (36.8 C) (Oral)   Resp (!) 33   Ht 5\' 6"  (1.676 m)    Wt 106.6 kg   LMP 12/26/2018   SpO2 94%   BMI 37.93 kg/m   Physical Exam Vitals signs and nursing note reviewed.  HENT:     Head: Atraumatic.  Neck:  Musculoskeletal: Neck supple.  Cardiovascular:     Rate and Rhythm: Normal rate.  Pulmonary:     Breath sounds: No wheezing, rhonchi or rales.  Chest:     Chest wall: No tenderness.  Genitourinary:    Comments: CVA tenderness on left side without rash. Musculoskeletal:     Comments: Some edema bilateral lower extremities.  Skin:    Capillary Refill: Capillary refill takes less than 2 seconds.  Neurological:     General: No focal deficit present.     Mental Status: She is alert.      ED Treatments / Results  Labs (all labs ordered are listed, but only abnormal results are displayed) Labs Reviewed  COMPREHENSIVE METABOLIC PANEL - Abnormal; Notable for the following components:      Result Value   Chloride 97 (*)    Glucose, Bld 211 (*)    All other components within normal limits  BRAIN NATRIURETIC PEPTIDE - Abnormal; Notable for the following components:   B Natriuretic Peptide 1,507.0 (*)    All other components within normal limits  CBC - Abnormal; Notable for the following components:   Hemoglobin 10.2 (*)    HCT 33.9 (*)    MCV 74.2 (*)    MCH 22.3 (*)    RDW 18.6 (*)    All other components within normal limits  URINALYSIS, ROUTINE W REFLEX MICROSCOPIC - Abnormal; Notable for the following components:   Color, Urine STRAW (*)    Leukocytes,Ua TRACE (*)    All other components within normal limits  TROPONIN I (HIGH SENSITIVITY) - Abnormal; Notable for the following components:   Troponin I (High Sensitivity) 19 (*)    All other components within normal limits  NOVEL CORONAVIRUS, NAA (HOSPITAL ORDER, SEND-OUT TO REF LAB)  TROPONIN I (HIGH SENSITIVITY)    EKG EKG Interpretation  Date/Time:  Tuesday January 02 2019 11:14:47 EDT Ventricular Rate:  87 PR Interval:    QRS Duration: 102 QT Interval:   422 QTC Calculation: 508 R Axis:   108 Text Interpretation:  Sinus rhythm Right axis deviation Borderline T wave abnormalities Borderline prolonged QT interval Confirmed by Davonna Belling [phone]) on 01/02/2019 11:18:14 AM   Radiology Dg Chest Portable 1 View  Result Date: 01/02/2019 CLINICAL DATA:  Shortness of breath EXAM: PORTABLE CHEST 1 VIEW COMPARISON:  October 26, 2018 FINDINGS: There is no evident edema or consolidation. Heart is slightly enlarged with pulmonary vascularity normal. No adenopathy. No bone lesions. IMPRESSION: Mild cardiac enlargement.  No edema or consolidation. Electronically Signed   By: Lowella Grip III M.D.   On: 01/02/2019 11:38    Procedures Procedures (including critical care time)  Medications Ordered in ED Medications  furosemide (LASIX) injection 80 mg (has no administration in time range)     Initial Impression / Assessment and Plan / ED Course  I have reviewed the triage vital signs and the nursing notes.  Pertinent labs & imaging results that were available during my care of the patient were reviewed by me and considered in my medical decision making (see chart for details).        Patient with shortness of breath.  Appears to be some CHF exacerbation.  Also flank pain.  I think more likely musculoskeletal.  It is in the flank but no hematuria so stones felt less likely.  BNP is elevated.  However not hypoxic.  Will give IM Lasix here and increase her diuretics at home.  Outpatient follow-up.  Patient's medications had  not been updated by staff.  She was already on torsemide 80 mg a day.  Will have PCP adjust dosing.  Final Clinical Impressions(s) / ED Diagnoses   Final diagnoses:  Acute on chronic congestive heart failure, unspecified heart failure type [facility])    ED Discharge Orders         Ordered    potassium chloride SA (K-DUR) 20 MEQ tablet  2 times daily     01/02/19 1248           Davonna Belling, MD 01/02/19 1255     Davonna Belling, MD 01/02/19 1418

## 2019-01-02 NOTE — Discharge Instructions (Addendum)
Take an extra dose of potassium for the next 5 days.

## 2019-01-02 NOTE — ED Triage Notes (Signed)
Pt reports a few days ago she had a spasm in the middle of her back.  Reports since then she has had a cough and sob.  Pt says she also has been nauseated.  Reports history of CHF.

## 2019-01-02 NOTE — ED Notes (Addendum)
Pt reports spasm on left side of back and has been SOB and nauseated since spasm. Dry cough

## 2019-01-03 LAB — NOVEL CORONAVIRUS, NAA (HOSP ORDER, SEND-OUT TO REF LAB; TAT 18-24 HRS): SARS-CoV-2, NAA: NOT DETECTED

## 2019-02-06 ENCOUNTER — Emergency Department (HOSPITAL_COMMUNITY)
Admission: EM | Admit: 2019-02-06 | Discharge: 2019-02-06 | Disposition: A | Discharge status: Home / Self Care | Payer: Medicaid Other | Attending: Emergency Medicine | Admitting: Emergency Medicine

## 2019-02-06 ENCOUNTER — Encounter (HOSPITAL_COMMUNITY): Payer: Self-pay | Admitting: Emergency Medicine

## 2019-02-06 ENCOUNTER — Emergency Department (HOSPITAL_COMMUNITY): Payer: Medicaid Other

## 2019-02-06 ENCOUNTER — Other Ambulatory Visit: Payer: Self-pay

## 2019-02-06 DIAGNOSIS — I5042 Chronic combined systolic (congestive) and diastolic (congestive) heart failure: Secondary | ICD-10-CM | POA: Insufficient documentation

## 2019-02-06 DIAGNOSIS — N838 Other noninflammatory disorders of ovary, fallopian tube and broad ligament: Secondary | ICD-10-CM | POA: Insufficient documentation

## 2019-02-06 DIAGNOSIS — D259 Leiomyoma of uterus, unspecified: Secondary | ICD-10-CM | POA: Diagnosis not present

## 2019-02-06 DIAGNOSIS — R109 Unspecified abdominal pain: Secondary | ICD-10-CM | POA: Diagnosis present

## 2019-02-06 DIAGNOSIS — E119 Type 2 diabetes mellitus without complications: Secondary | ICD-10-CM | POA: Insufficient documentation

## 2019-02-06 DIAGNOSIS — Z79899 Other long term (current) drug therapy: Secondary | ICD-10-CM | POA: Diagnosis not present

## 2019-02-06 DIAGNOSIS — I110 Hypertensive heart disease with heart failure: Secondary | ICD-10-CM | POA: Insufficient documentation

## 2019-02-06 DIAGNOSIS — Z7982 Long term (current) use of aspirin: Secondary | ICD-10-CM | POA: Insufficient documentation

## 2019-02-06 DIAGNOSIS — Z794 Long term (current) use of insulin: Secondary | ICD-10-CM | POA: Diagnosis not present

## 2019-02-06 DIAGNOSIS — I2510 Atherosclerotic heart disease of native coronary artery without angina pectoris: Secondary | ICD-10-CM | POA: Diagnosis not present

## 2019-02-06 LAB — COMPREHENSIVE METABOLIC PANEL
ALT: 17 U/L (ref 0–44)
AST: 19 U/L (ref 15–41)
Albumin: 3.4 g/dL — ABNORMAL LOW (ref 3.5–5.0)
Alkaline Phosphatase: 73 U/L (ref 38–126)
Anion gap: 9 (ref 5–15)
BUN: 17 mg/dL (ref 6–20)
CO2: 27 mmol/L (ref 22–32)
Calcium: 8.5 mg/dL — ABNORMAL LOW (ref 8.9–10.3)
Chloride: 99 mmol/L (ref 98–111)
Creatinine, Ser: 0.94 mg/dL (ref 0.44–1.00)
GFR calc Af Amer: >60 mL/min (ref >60)
GFR calc non Af Amer: >60 mL/min (ref >60)
Glucose, Bld: 181 mg/dL — ABNORMAL HIGH (ref 70–99)
Potassium: 3.2 mmol/L — ABNORMAL LOW (ref 3.5–5.1)
Sodium: 135 mmol/L (ref 135–145)
Total Bilirubin: 0.5 mg/dL (ref 0.3–1.2)
Total Protein: 7.6 g/dL (ref 6.5–8.1)

## 2019-02-06 LAB — BRAIN NATRIURETIC PEPTIDE: B Natriuretic Peptide: 855 pg/mL — ABNORMAL HIGH (ref 0.0–100.0)

## 2019-02-06 LAB — CBC WITH DIFFERENTIAL/PLATELET
Abs Immature Granulocytes: 0.04 10*3/uL (ref 0.00–0.07)
Basophils Absolute: 0.1 10*3/uL (ref 0.0–0.1)
Basophils Relative: 1 %
Eosinophils Absolute: 0.1 10*3/uL (ref 0.0–0.5)
Eosinophils Relative: 1 %
HCT: 34.5 % — ABNORMAL LOW (ref 36.0–46.0)
Hemoglobin: 10.2 g/dL — ABNORMAL LOW (ref 12.0–15.0)
Immature Granulocytes: 0 %
Lymphocytes Relative: 27 %
Lymphs Abs: 2.9 10*3/uL (ref 0.7–4.0)
MCH: 22.8 pg — ABNORMAL LOW (ref 26.0–34.0)
MCHC: 29.6 g/dL — ABNORMAL LOW (ref 30.0–36.0)
MCV: 77.2 fL — ABNORMAL LOW (ref 80.0–100.0)
Monocytes Absolute: 0.5 10*3/uL (ref 0.1–1.0)
Monocytes Relative: 4 %
Neutro Abs: 7.2 10*3/uL (ref 1.7–7.7)
Neutrophils Relative %: 67 %
Platelets: 278 10*3/uL (ref 150–400)
RBC: 4.47 MIL/uL (ref 3.87–5.11)
RDW: 14.9 % (ref 11.5–15.5)
WBC: 10.7 10*3/uL — ABNORMAL HIGH (ref 4.0–10.5)
nRBC: 0 % (ref 0.0–0.2)

## 2019-02-06 LAB — LIPASE, BLOOD: Lipase: 26 U/L (ref 11–51)

## 2019-02-06 LAB — URINALYSIS, ROUTINE W REFLEX MICROSCOPIC
Bilirubin Urine: NEGATIVE
Glucose, UA: NEGATIVE mg/dL
Hgb urine dipstick: NEGATIVE
Ketones, ur: NEGATIVE mg/dL
Nitrite: NEGATIVE
Protein, ur: 30 mg/dL — AB
Specific Gravity, Urine: 1.015 (ref 1.005–1.030)
pH: 5 (ref 5.0–8.0)

## 2019-02-06 LAB — PREGNANCY, URINE: Preg Test, Ur: NEGATIVE

## 2019-02-06 MED ORDER — MORPHINE SULFATE (PF) 4 MG/ML IV SOLN
4.0000 mg | Freq: Once | INTRAVENOUS | Status: AC
  Administered 2019-02-06: 18:00:00 4 mg via INTRAVENOUS
  Filled 2019-02-06: qty 1

## 2019-02-06 MED ORDER — ONDANSETRON HCL 4 MG/2ML IJ SOLN
4.0000 mg | Freq: Once | INTRAMUSCULAR | Status: AC
  Administered 2019-02-06: 18:00:00 4 mg via INTRAVENOUS
  Filled 2019-02-06: qty 2

## 2019-02-06 MED ORDER — CARVEDILOL 12.5 MG PO TABS
50.0000 mg | ORAL_TABLET | Freq: Once | ORAL | Status: AC
  Administered 2019-02-06: 18:00:00 50 mg via ORAL
  Filled 2019-02-06: qty 4

## 2019-02-06 MED ORDER — OXYCODONE-ACETAMINOPHEN 5-325 MG PO TABS
1.0000 | ORAL_TABLET | Freq: Once | ORAL | Status: AC
  Administered 2019-02-06: 1 via ORAL
  Filled 2019-02-06: qty 1

## 2019-02-06 MED ORDER — HYDRALAZINE HCL 20 MG/ML IJ SOLN
10.0000 mg | Freq: Once | INTRAMUSCULAR | Status: AC
  Administered 2019-02-06: 10 mg via INTRAVENOUS
  Filled 2019-02-06: qty 1

## 2019-02-06 MED ORDER — IOHEXOL 300 MG/ML  SOLN
100.0000 mL | Freq: Once | INTRAMUSCULAR | Status: AC | PRN
  Administered 2019-02-06: 100 mL via INTRAVENOUS

## 2019-02-06 MED ORDER — PROMETHAZINE HCL 25 MG/ML IJ SOLN
12.5000 mg | Freq: Once | INTRAMUSCULAR | Status: AC
  Administered 2019-02-06: 12.5 mg via INTRAVENOUS
  Filled 2019-02-06: qty 1

## 2019-02-06 MED ORDER — PROMETHAZINE HCL 25 MG PO TABS: 25.0000 mg | ORAL_TABLET | Freq: Four times a day (QID) | ORAL | 0 refills | Status: DC | PRN

## 2019-02-06 MED ORDER — OXYCODONE-ACETAMINOPHEN 5-325 MG PO TABS: 1.0000 | ORAL_TABLET | ORAL | 0 refills | Status: DC | PRN

## 2019-02-06 NOTE — ED Triage Notes (Signed)
Pt c/o LT sided flank pain that began on Thursday. Pt states pain eased off and is now sore. States she began vomiting yesterday. Denies fever, diarrhea, or urinary issues.

## 2019-02-06 NOTE — Discharge Instructions (Signed)
As discussed, you will need further tests to diagnose the reason for your suspected ovarian enlargement/mass which may be the source of your pain today.  You may use the medicines prescribed for pain and nausea.  Call Dr. Elonda Husky in the morning to arrange an office visit this week.  Do not drive within 4 hours of taking percocet as this medicine will make you sleepy.

## 2019-02-06 NOTE — ED Provider Notes (Signed)
[facility] EMERGENCY DEPARTMENT Provider Note   CSN: [id number] Arrival date & time: 02/06/19  1218     History   Chief Complaint Chief Complaint  Patient presents with  . Flank Pain    HPI Chelsey Santiago is a 43 y.o. female with a history as outlined below, most significant for a history of CHF, type 2 diabetes, hypertension, migraine headaches and CAD with history of non-STEMI presenting with a 5-day history of left sided abdominal pain with nausea and emesis.  Her symptoms initially started in her left flank 5 days ago, she was treated by her PCP for presumptive kidney stone.  This pain has resolved but she now has persistent soreness along with nausea and vomiting along her left upper and lower abdomen.  She denies distention, has had no bowel or bladder changes specifically no diarrhea or constipation, her last bowel movement was yesterday and normal.  She has had nausea with emesis, multiple episodes since yesterday.  She denies chest pain or shortness of breath, denies orthopnea or peripheral edema, but has noted that a deep inspiration worsens her abdominal pain.     The history is provided by the patient.    Past Medical History:  Diagnosis Date  . Anemia    H&H of 10.6/33 and 07/2008 and 11.9/35 and 09/2010  . Anxiety   . CHF (congestive heart failure) ([place])    a. EF 40-45% by echo in 12/2015 with cath showing normal cors  . Depression with anxiety   . Diabetes mellitus without complication ([place])   . Enlarged heart   . Hypertension   . Hypertensive heart disease 2009   Pulmonary edema postpartum; mild to moderate mitral regurgitation when hospitalized for CHF in 2009; Echocardiogram in 12/2009-no MR and normal EF; normal CXR in 09/2010  . Migraine headache   . Miscarriage 03/19/2013  . Obesity 04/16/2009  . Osteoarthritis, knee 03/29/2011  . Preeclampsia   . Pregnant   . Pulmonary edema   . Sleep apnea   . Threatened abortion in early pregnancy 03/15/2013     Patient Active Problem List   Diagnosis Date Noted  . Class 2 obesity   . Acute exacerbation of CHF (congestive heart failure) ([place]) 10/26/2018  . Chronic combined systolic and diastolic CHF (congestive heart failure) ([place]) 06/02/2018  . Uncontrolled type 2 diabetes mellitus with hyperglycemia ([place]) 06/02/2018  . Influenza B 05/13/2018  . Hypomagnesemia 05/12/2018  . Headache 05/12/2018  . Upper respiratory tract infection   . HCAP (healthcare-associated pneumonia)   . Constipation 10/17/2017  . Bad headache   . CHF exacerbation ([place]) 09/29/2017  . Iron deficiency anemia 05/16/2017  . Vitamin D deficiency 11/25/2016  . Iron deficiency 11/25/2016  . History of acute myocardial infarction 10/05/2016  . CHF (congestive heart failure) ([place]) 05/06/2016  . Diabetes mellitus with complication ([place]) [id number]  . Leukocytosis 05/06/2016  . Neuropathy 05/06/2016  . Depression 04/16/2016  . Chronic tension-type headache, not intractable 04/16/2016  . AKI (acute kidney injury) ([place])   . Hyperkalemia   . Nonischemic cardiomyopathy ([place])   . Acute on chronic combined systolic and diastolic ACC/AHA stage C congestive heart failure ([place]) 04/03/2016  . Acute on chronic combined systolic and diastolic CHF, NYHA class 4 ([place]) 04/03/2016  . Hypertensive emergency 04/03/2016  . Cardiomyopathy due to hypertension ([place]) 12/22/2015  . Normal coronary arteries 12/22/2015  . Troponin level elevated 12/22/2015  . NSTEMI (non-ST elevated myocardial infarction) ([place]) 12/20/2015  . Dental infection 10/10/2015  . Chest  pain 09/05/2015  . Systolic CHF, chronic ([place]) 09/05/2015  . LLQ pain   . Type 2 diabetes mellitus without complication ([place]) [id number]  . Essential hypertension   . 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 Hypertensive urgency 04/22/2014  . Acute CHF ([place]) 04/22/2014  . S/P cesarean section 04/11/2014  . Acute pulmonary edema ([place]) 04/11/2014  . Postoperative anemia 04/11/2014  .  Elevated serum creatinine 04/11/2014  . Preeclampsia, severe 04/09/2014  . Pre-eclampsia superimposed on chronic hypertension, antepartum 04/08/2014  . Dyspnea 04/08/2014  . Polyhydramnios in third trimester, antepartum 03/14/2014  . Abnormal maternal glucose tolerance, antepartum 03/11/2014  . High-risk pregnancy 03/11/2014  . Pre-existing essential hypertension complicating pregnancy 0000000  . Impaired glucose tolerance during pregnancy, antepartum 11/27/2013  . Leiomyoma of uterus 11/22/2013  . History of gestational diabetes in prior pregnancy, currently pregnant in first trimester 11/22/2013  . Hx of preeclampsia, prior pregnancy, currently pregnant 11/22/2013  . Short interval between pregnancies affecting pregnancy, antepartum 11/22/2013  . Supervision of high-risk pregnancy of elderly primigravida (>= 36 years old at delivery), third trimester 11/22/2013  . Miscarriage 03/19/2013  . Major depressive disorder, single episode, unspecified 09/27/2011  . Hypertension   . Hypertensive cardiovascular disease   . Microcytic anemia   . Osteoarthrosis involving lower leg 03/29/2011  . Hypokalemia 12/12/2009  . OSA on CPAP 12/09/2009  . Morbid obesity ([place]) 04/16/2009    Past Surgical History:  Procedure Laterality Date  . BREAST REDUCTION SURGERY  2002  . CARDIAC CATHETERIZATION N/A 12/22/2015   Procedure: Left Heart Cath and Coronary Angiography;  Surgeon: Peter M Martinique, MD;  Location: [facility];  Service: Cardiovascular;  Laterality: N/A;  . CESAREAN SECTION N/A 04/09/2014   Procedure: CESAREAN SECTION;  Surgeon: Mora Bellman, MD;  Location: [facility];  Service: Obstetrics;  Laterality: N/A;  . CHOLECYSTECTOMY       OB History    Gravida  11   Para  6   Term  5   Preterm  1   AB  5   Living  6     SAB  3   TAB  2   Ectopic      Multiple  0   Live Births  6            Home Medications    Prior to Admission medications   Medication Sig  Start Date End Date Taking? Authorizing Provider  albuterol (PROVENTIL HFA;VENTOLIN HFA) 108 (90 Base) MCG/ACT inhaler Inhale 1 puff into the lungs every 6 (six) hours as needed for wheezing or shortness of breath.   Yes [provider]  ARIPiprazole (ABILIFY) 10 MG tablet Take 1 tablet (10 mg total) by mouth daily. 10/30/18  Yes Barton Dubois, MD  aspirin EC 81 MG tablet Take 81 mg by mouth daily.   Yes [provider]  atorvastatin (LIPITOR) 20 MG tablet Take 1 tablet (20 mg total) by mouth daily at 6 PM. 06/03/18  Yes Tat, Shanon Brow, MD  budesonide-formoterol (SYMBICORT) 80-4.5 MCG/ACT inhaler Inhale 2 puffs into the lungs 2 (two) times daily. 05/04/18  Yes Shah, Pratik D, DO  carvedilol (COREG) 25 MG tablet Take 2 tablets (50 mg total) by mouth 2 (two) times daily with a meal. 06/03/18  Yes Tat, David, MD  citalopram (CELEXA) 20 MG tablet Take 20 mg by mouth daily. 08/19/17  Yes [provider]  gabapentin (NEURONTIN) 600 MG tablet Take 600 mg by mouth 3 (three) times daily.    Yes [provider]  hydrALAZINE (APRESOLINE) 100 MG tablet Take 1 tablet (100 mg total) by mouth 3 (three) times daily. 06/03/18  Yes Tat, Shanon Brow, MD  insulin glargine (LANTUS) 100 UNIT/ML injection Inject 30 Units at bedtime into the skin.    Yes [provider]  insulin lispro (HUMALOG) 100 UNIT/ML injection Inject 15 Units into the skin 3 (three) times daily before meals.    Yes [provider]  Ipratropium-Albuterol (COMBIVENT RESPIMAT) 20-100 MCG/ACT AERS respimat Inhale 1 puff into the lungs every 6 (six) hours as needed for wheezing or shortness of breath. 05/04/18  Yes Shah, Pratik D, DO  metFORMIN (GLUCOPHAGE) 500 MG tablet Take 500 mg 2 (two) times daily with a meal by mouth.   Yes [provider]  nitroGLYCERIN (NITROSTAT) 0.4 MG SL tablet Place 1 tablet (0.4 mg total) under the tongue every 5 (five) minutes as needed for chest pain. 09/05/15  Yes Kathie Dike, MD  omeprazole (PRILOSEC) 40 MG capsule Take 40 mg by mouth daily.   Yes [provider]  ondansetron (ZOFRAN) 4 MG tablet Take 4 mg by mouth daily as needed for nausea or vomiting.   Yes [provider]  potassium chloride SA (K-DUR) 20 MEQ tablet Take 1 tablet (20 mEq total) by mouth 2 (two) times daily. 01/02/19  Yes Davonna Belling, MD  pyridostigmine (MESTINON) 60 MG tablet Take 60 mg by mouth every 8 (eight) hours.   Yes [provider]  sacubitril-valsartan (ENTRESTO) 97-103 MG Take 1 tablet by mouth 2 (two) times daily. 04/09/16  Yes Arrien, Jimmy Picket, MD  torsemide (DEMADEX) 20 MG tablet Take 2 tablets (40 mg total) by mouth 2 (two) times daily. Please take 40 mg oral every morning and 20 mg every evening. Patient taking differently: Take 40 mg by mouth 2 (two) times daily.  10/29/18 02/06/19 Yes Barton Dubois, MD  traZODone (DESYREL) 100 MG tablet Take 2 tablets (200 mg total) by mouth at bedtime. 10/29/18  Yes Barton Dubois, MD  methocarbamol (ROBAXIN) 500 MG tablet Take 1 tablet (500 mg total) by mouth every 8 (eight) hours as needed for muscle spasms. Patient not taking: Reported on 02/06/2019 01/02/19   Davonna Belling, MD  oxyCODONE-acetaminophen (PERCOCET/ROXICET) 5-325 MG tablet Take 1 tablet by mouth every 4 (four) hours as needed. 02/06/19   Evalee Jefferson, PA-C  promethazine (PHENERGAN) 25 MG tablet Take 1 tablet (25 mg total) by mouth every 6 (six) hours as needed for nausea or vomiting. 02/06/19   Evalee Jefferson, PA-C    Family History Family History  Problem Relation Age of Onset  . Diabetes Mother 16  . Heart disease Mother   . Hyperlipidemia Paternal Grandfather   . Hypertension Paternal Grandfather   . Heart disease Father   . Hypertension Father   . Heart disease Maternal Grandmother 60  . ADD / ADHD Son   . Hypertension Maternal Uncle   . Heart attack Brother   . Sudden death Neg Hx   . Colon cancer Neg Hx   . Celiac disease  Neg Hx   . Inflammatory bowel disease Neg Hx     Social History Social History   Tobacco Use  . Smoking status: Never Smoker  . Smokeless tobacco: Never Used  Substance Use Topics  . Alcohol use: Yes    Comment: occ  . Drug use: No     Allergies   Diclofenac, Tramadol, Vicodin [hydrocodone-acetaminophen], and Hydrocodone bitartrate er   Review of Systems Review of Systems  Constitutional: Negative for fever.  HENT: Negative for congestion and sore throat.   Eyes: Negative.   Respiratory: Negative for chest tightness and shortness of breath.   Cardiovascular: Negative for chest pain.  Gastrointestinal: Positive for abdominal pain, nausea and vomiting.  Genitourinary: Positive for flank pain. Negative for dysuria and vaginal discharge.  Musculoskeletal: Negative for arthralgias, joint swelling and neck pain.  Skin: Negative.  Negative for rash and wound.  Neurological: Negative for dizziness, weakness, light-headedness, numbness and headaches.  Psychiatric/Behavioral: Negative.      Physical Exam Updated Vital Signs BP (!) 168/94   Pulse 84   Temp 97.6 F (36.4 C) (Oral)   Resp 16   LMP 01/16/2019 (Approximate)   SpO2 96%   Physical Exam Vitals signs and nursing note reviewed.  Constitutional:      Appearance: She is well-developed.  HENT:     Head: Normocephalic and atraumatic.  Eyes:     Conjunctiva/sclera: Conjunctivae normal.  Neck:     Musculoskeletal: Normal range of motion.  Cardiovascular:     Rate and Rhythm: Normal rate and regular rhythm.     Heart sounds: Normal heart sounds.  Pulmonary:     Effort: Pulmonary effort is normal.     Breath sounds: Normal breath sounds. No wheezing, rhonchi or rales.  Abdominal:     General: Abdomen is protuberant. Bowel sounds are normal. There is no distension.     Palpations: Abdomen is soft.     Tenderness: There is abdominal tenderness in the left upper quadrant and left lower quadrant. There is no left  CVA tenderness, guarding or rebound. Negative signs include Murphy's sign.  Musculoskeletal: Normal range of motion.  Skin:    General: Skin is warm and dry.  Neurological:     Mental Status: She is alert.      ED Treatments / Results  Labs (all labs ordered are listed, but only abnormal results are displayed) Labs Reviewed  URINALYSIS, ROUTINE W REFLEX MICROSCOPIC - Abnormal; Notable for the following components:      Result Value   APPearance HAZY (*)    Protein, ur 30 (*)    Leukocytes,Ua TRACE (*)    Bacteria, UA RARE (*)    All other components within normal limits  CBC WITH DIFFERENTIAL/PLATELET - Abnormal; Notable for the following components:   WBC 10.7 (*)    Hemoglobin 10.2 (*)    HCT 34.5 (*)    MCV 77.2 (*)    MCH 22.8 (*)    MCHC 29.6 (*)    All other components within normal limits  COMPREHENSIVE METABOLIC PANEL - Abnormal; Notable for the following components:   Potassium 3.2 (*)    Glucose, Bld 181 (*)    Calcium 8.5 (*)    Albumin 3.4 (*)    All other components within normal limits  BRAIN NATRIURETIC PEPTIDE - Abnormal; Notable for the following components:   B Natriuretic Peptide 855.0 (*)    All other components within normal limits  PREGNANCY, URINE  LIPASE, BLOOD    EKG None  Radiology Ct Abdomen Pelvis W Contrast  Result Date: 02/06/2019 CLINICAL DATA:  Abdominal pain, primarily left-sided EXAM: CT ABDOMEN AND PELVIS WITH CONTRAST TECHNIQUE: Multidetector CT imaging of the abdomen and pelvis was performed using the standard protocol following bolus administration of intravenous contrast. CONTRAST:  167mL OMNIPAQUE IOHEXOL 300 MG/ML  SOLN COMPARISON:  None. FINDINGS: Lower chest: There is bibasilar atelectasis. Heart appears enlarged. Hepatobiliary: Liver measures 20.0 cm in  length. No focal liver lesions are evident. Gallbladder is absent. There is no appreciable biliary duct dilatation. Pancreas: There is no pancreatic mass or inflammatory  focus. Spleen: No splenic lesions are evident. Adrenals/Urinary Tract: Adrenals bilaterally appear unremarkable. Kidneys bilaterally show no evident mass or hydronephrosis on either side. There is no evident renal or ureteral calculus on either side. Urinary bladder is midline with wall thickness within normal limits. Stomach/Bowel: There is no appreciable bowel wall or mesenteric thickening. There are scattered diverticula throughout the colon without diverticulitis. There is no appreciable bowel obstruction. Terminal ileum appears normal. There is no evident free air or portal venous air. Vascular/Lymphatic: There is no abdominal aortic aneurysm. No vascular lesions are evident. There is no adenopathy appreciable in the abdomen or pelvis. Reproductive: The uterus is anteverted. The uterus is enlarged and inhomogeneous with scattered calcifications, likely due to leiomyomatous change. There is a solid mass immediately adjacent to the uterus on the left which impresses upon the urinary bladder. This mass is somewhat difficult to separate confidently from the uterus, although it is felt that this mass most likely is a left ovarian mass as opposed to arising from the uterus. This solid suspected left ovarian mass measures 9.3 x 6.2 x 7.7 cm. No right adnexal mass evident. Other: Appendix appears normal. No abscess or ascites in the abdomen or pelvis. Musculoskeletal: There is degenerative change in the lumbar spine. There are no blastic or lytic bone lesions. There is no intramuscular or abdominal wall lesions. IMPRESSION: 1. Solid mass immediately to the left of the uterus, felt to be separate from the uterus and arising from the left ovary/left adnexa. This mass measures 9.3 x 6.2 x 7.7 cm. 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 Gynecologic assessment advised in this regard. 2. Prominent uterus containing calcification inhomogeneous attenuation, likely due to leiomyomatous change. 3. Scattered  diverticula throughout the colon without diverticulitis. No bowel obstruction. No abscess in the abdomen pelvis. Appendix appears normal. 4. Prominent liver without focal liver lesion evident. Gallbladder absent. 5. No evident renal or ureteral calculus. No hydronephrosis. Urinary bladder wall thickness normal. Note that the left adnexal mass impresses upon the urinary bladder. Electronically Signed   By: Lowella Grip III M.D.   On: 02/06/2019 18:44    Procedures Procedures (including critical care time)  Medications Ordered in ED Medications  morphine 4 MG/ML injection 4 mg (4 mg Intravenous Given 02/06/19 1735)  ondansetron (ZOFRAN) injection 4 mg (4 mg Intravenous Given 02/06/19 1735)  hydrALAZINE (APRESOLINE) injection 10 mg (10 mg Intravenous Given 02/06/19 1735)  carvedilol (COREG) tablet 50 mg (50 mg Oral Given 02/06/19 1734)  iohexol (OMNIPAQUE) 300 MG/ML solution 100 mL (100 mLs Intravenous Contrast Given 02/06/19 1748)  promethazine (PHENERGAN) injection 12.5 mg (12.5 mg Intravenous Given 02/06/19 2012)  oxyCODONE-acetaminophen (PERCOCET/ROXICET) 5-325 MG per tablet 1 tablet (1 tablet Oral Given 02/06/19 2013)     Initial Impression / Assessment and Plan / ED Course  I have reviewed the triage vital signs and the nursing notes.  Pertinent labs & imaging results that were available during my care of the patient were reviewed by me and considered in my medical decision making (see chart for details).        Ct imaging and labs reviewed and discussed with pt.  Advised she needs close f/uw with gyn for further diagnosis of suspected ovarian mass.  Pt will call in am for appt with Dr.Eure[facility].  Prescribed oxycodone, phenergan for sx relief.  Ct negative for obstruction. BNP elevated but less so than prior visit here last month, denies orthopnea or sob, describes worse abd pain with deep inspiration.  Doubt CHF exacerbation.  Her bp was very elevated upon first arrival, was  unable to keep medicines down secondary to pain, n/v.  Given IV hydralazine, and her missed PO dose of coreg with sig improvement in bp.      Final Clinical Impressions(s) / ED Diagnoses   Final diagnoses:  Ovarian mass, left  Uterine leiomyoma, unspecified location    ED Discharge Orders         Ordered    oxyCODONE-acetaminophen (PERCOCET/ROXICET) 5-325 MG tablet  Every 4 hours PRN     02/06/19 2002    promethazine (PHENERGAN) 25 MG tablet  Every 6 hours PRN     02/06/19 2002           Landis Martins 02/06/19 2123    Milton Ferguson, MD 02/08/19 1119

## 2019-02-09 ENCOUNTER — Ambulatory Visit (INDEPENDENT_AMBULATORY_CARE_PROVIDER_SITE_OTHER): Payer: Medicaid Other | Admitting: Obstetrics and Gynecology

## 2019-02-09 ENCOUNTER — Other Ambulatory Visit: Payer: Self-pay

## 2019-02-09 ENCOUNTER — Encounter: Payer: Self-pay | Admitting: Obstetrics and Gynecology

## 2019-02-09 VITALS — BP 175/106 | HR 86 | Ht 66.0 in | Wt 239.2 lb

## 2019-02-09 DIAGNOSIS — R1900 Intra-abdominal and pelvic swelling, mass and lump, unspecified site: Secondary | ICD-10-CM | POA: Diagnosis not present

## 2019-02-09 NOTE — Progress Notes (Addendum)
Patient ID: Chelsey Santiago, female   DOB: Oct 09, 1975, 43 y.o.   MRN: [id number]    [facility] Visit  @DATE @            Patient name: Chelsey Santiago MRN [id number]  Date of birth: 02-Mar-1976  CC & HPI:  Chelsey Santiago is a 43 y.o. female presenting today for e/r follow up. In past 3-4 years notices when she puts in tampon has pain on left side. Has skin tag and would like to have removed has had it roughly 2 years. Went to [facility] 02/06/2019 ct done and showed : IMPRESSION: 1. Solid mass immediately to the left of the uterus, felt to be separate from the uterus and arising from the left ovary/left adnexa. This mass measures 9.3 x 6.2 x 7.7 cm. 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 Gynecologic assessment advised in this regard.  2. Prominent uterus containing calcification inhomogeneous attenuation, likely due to leiomyomatous change.  3. Scattered diverticula throughout the colon without diverticulitis. No bowel obstruction. No abscess in the abdomen pelvis. Appendix appears normal.  4. Prominent liver without focal liver lesion evident. Gallbladder absent.  5. No evident renal or ureteral calculus. No hydronephrosis. Urinary bladder wall thickness normal. Note that the left adnexal mass impresses upon the urinary bladder.   Electronically Signed   By: Lowella Grip III M.D.   On: 02/06/2019 18:44   ROS:  ROS   Pertinent History Reviewed:   Reviewed: Significant for  Medical         Past Medical History:  Diagnosis Date   Anemia    H&H of 10.6/33 and 07/2008 and 11.9/35 and 09/2010   Anxiety    CHF (congestive heart failure) ([place])    a. EF 40-45% by echo in 12/2015 with cath showing normal cors   Depression with anxiety    Diabetes mellitus without complication ([place])    Enlarged heart    Hypertension    Hypertensive heart disease 2009   Pulmonary edema postpartum; mild to moderate mitral regurgitation when hospitalized  for CHF in 2009; Echocardiogram in 12/2009-no MR and normal EF; normal CXR in 09/2010   Migraine headache    Miscarriage 03/19/2013   Obesity 04/16/2009   Osteoarthritis, knee 03/29/2011   Preeclampsia    Pregnant    Pulmonary edema    Sleep apnea    Threatened abortion in early pregnancy 03/15/2013                              Surgical Hx:    Past Surgical History:  Procedure Laterality Date   BREAST REDUCTION SURGERY  2002   CARDIAC CATHETERIZATION N/A 12/22/2015   Procedure: Left Heart Cath and Coronary Angiography;  Surgeon: Peter M Martinique, MD;  Location: [facility];  Service: Cardiovascular;  Laterality: N/A;   CESAREAN SECTION N/A 04/09/2014   Procedure: CESAREAN SECTION;  Surgeon: Mora Bellman, MD;  Location: [facility];  Service: Obstetrics;  Laterality: N/A;   CHOLECYSTECTOMY     Medications: Reviewed & Updated - see associated section                       Current Outpatient Medications:    albuterol (PROVENTIL HFA;VENTOLIN HFA) 108 (90 Base) MCG/ACT inhaler, Inhale 1 puff into the lungs every 6 (six) hours as needed for wheezing or shortness of breath., Disp: , Rfl:    AMOXICILLIN PO, Take  500 mg by mouth 2 (two) times daily., Disp: , Rfl:    ARIPiprazole (ABILIFY) 10 MG tablet, Take 1 tablet (10 mg total) by mouth daily., Disp:  , Rfl:    aspirin EC 81 MG tablet, Take 81 mg by mouth daily., Disp: , Rfl:    atorvastatin (LIPITOR) 20 MG tablet, Take 1 tablet (20 mg total) by mouth daily at 6 PM., Disp: 30 tablet, Rfl: 1   budesonide-formoterol (SYMBICORT) 80-4.5 MCG/ACT inhaler, Inhale 2 puffs into the lungs 2 (two) times daily., Disp: 1 Inhaler, Rfl: 12   carvedilol (COREG) 25 MG tablet, Take 2 tablets (50 mg total) by mouth 2 (two) times daily with a meal., Disp: 120 tablet, Rfl: 1   gabapentin (NEURONTIN) 600 MG tablet, Take 600 mg by mouth 3 (three) times daily. , Disp: , Rfl:    hydrALAZINE (APRESOLINE) 100 MG tablet, Take 1 tablet (100 mg total)  by mouth 3 (three) times daily., Disp: 30 tablet, Rfl: 1   insulin glargine (LANTUS) 100 UNIT/ML injection, Inject 30 Units at bedtime into the skin. , Disp: , Rfl:    insulin lispro (HUMALOG) 100 UNIT/ML injection, Inject 15 Units into the skin 3 (three) times daily before meals. , Disp: , Rfl:    Ipratropium-Albuterol (COMBIVENT RESPIMAT) 20-100 MCG/ACT AERS respimat, Inhale 1 puff into the lungs every 6 (six) hours as needed for wheezing or shortness of breath., Disp: 1 Inhaler, Rfl: 0   metFORMIN (GLUCOPHAGE) 500 MG tablet, Take 500 mg 2 (two) times daily with a meal by mouth., Disp: , Rfl:    methocarbamol (ROBAXIN) 500 MG tablet, Take 1 tablet (500 mg total) by mouth every 8 (eight) hours as needed for muscle spasms., Disp: 8 tablet, Rfl: 0   nitroGLYCERIN (NITROSTAT) 0.4 MG SL tablet, Place 1 tablet (0.4 mg total) under the tongue every 5 (five) minutes as needed for chest pain., Disp: 30 tablet, Rfl: 12   omeprazole (PRILOSEC) 40 MG capsule, Take 40 mg by mouth daily., Disp: , Rfl:    ondansetron (ZOFRAN) 4 MG tablet, Take 4 mg by mouth daily as needed for nausea or vomiting., Disp: , Rfl:    oxyCODONE-acetaminophen (PERCOCET/ROXICET) 5-325 MG tablet, Take 1 tablet by mouth every 4 (four) hours as needed., Disp: 20 tablet, Rfl: 0   potassium chloride SA (K-DUR) 20 MEQ tablet, Take 1 tablet (20 mEq total) by mouth 2 (two) times daily., Disp: 10 tablet, Rfl: 0   promethazine (PHENERGAN) 25 MG tablet, Take 1 tablet (25 mg total) by mouth every 6 (six) hours as needed for nausea or vomiting., Disp: 30 tablet, Rfl: 0   pyridostigmine (MESTINON) 60 MG tablet, Take 60 mg by mouth every 8 (eight) hours., Disp: , Rfl:    sacubitril-valsartan (ENTRESTO) 97-103 MG, Take 1 tablet by mouth 2 (two) times daily., Disp: 60 tablet, Rfl: 0   traZODone (DESYREL) 100 MG tablet, Take 2 tablets (200 mg total) by mouth at bedtime., Disp:  , Rfl:    UNABLE TO FIND, Unsure of name-BID, Disp: , Rfl:     torsemide (DEMADEX) 20 MG tablet, Take 2 tablets (40 mg total) by mouth 2 (two) times daily. Please take 40 mg oral every morning and 20 mg every evening. (Patient taking differently: Take 40 mg by mouth 2 (two) times daily. ), Disp: 90 tablet, Rfl: 0   Social History: Reviewed -  reports that she has never smoked. She has never used smokeless tobacco.  Objective Findings:  Vitals: Blood pressure Marland Kitchen)  175/106, pulse 86, height 5\' 6"  (1.676 m), weight 239 lb 3.2 oz (108.5 kg), last menstrual period 01/16/2019.  PHYSICAL EXAMINATION General appearance - alert, well appearing, and in no distress and overweight Mental status - alert, oriented to person, place, and time, normal mood, behavior, speech, dress, motor activity, and thought processes  PELVIC External genitalia - Skin tag 2 cm by 2 cm benign appearing right labia majora benign  Vagina - blood, consistent with menses Cervix - multip  Uterus - normal  Assessment & Plan:   A:  1. CHTN, on meds, poorly controlled 2. Left adnexal mass, fibroid vs ovarian mass. 3. Right labia majora skin sag 4. S/p post partum depression 5.  needs pap.  P:  1.  Check BP x7 a week 2. TV u/s next week 3. F/u 3 weeks results 4. Pap after menses. 5. Skin tag removal in future, perhaps at surgery for ovary.    By signing my name below, I, Samul Dada, attest that this documentation has been prepared under the direction and in the presence of Jonnie Kind, MD. Electronically Signed: [place]. 02/09/19. 11:13 AM.  I personally performed the services described in this documentation, which was SCRIBED in my presence. The recorded information has been reviewed and considered accurate. It has been edited as necessary during review. Jonnie Kind, MD

## 2019-02-16 ENCOUNTER — Ambulatory Visit (HOSPITAL_COMMUNITY)
Admission: RE | Admit: 2019-02-16 | Discharge: 2019-02-16 | Disposition: A | Discharge status: Home / Self Care | Payer: Medicaid Other | Source: Ambulatory Visit | Attending: Obstetrics and Gynecology | Admitting: Obstetrics and Gynecology

## 2019-02-16 ENCOUNTER — Other Ambulatory Visit: Payer: Self-pay

## 2019-02-16 DIAGNOSIS — R1900 Intra-abdominal and pelvic swelling, mass and lump, unspecified site: Secondary | ICD-10-CM | POA: Diagnosis present

## 2019-02-26 ENCOUNTER — Encounter: Payer: Self-pay | Admitting: Gastroenterology

## 2019-03-01 ENCOUNTER — Ambulatory Visit: Payer: Medicaid Other | Admitting: Student

## 2019-03-01 NOTE — Progress Notes (Deleted)
Cardiology Office Note    Date:  03/01/2019   ID:  Livie Grelle, DOB 1976-04-15, MRN [id number]  PCP:  Rosita Fire, MD  Cardiologist: Carlyle Dolly, MD    No chief complaint on file.   History of Present Illness:    Chelsey Santiago is a 43 y.o. 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  She was last examined in clinic by myself in 11/2017 and reported having baseline dyspnea on exertion but had experienced episodes of chest discomfort for the past few weeks.  Blood pressure was also elevated at 168/94.  Her pain was overall atypical and only lasting for seconds at a time and given her recent catheterization showing no significant CAD, no further ischemic testing was pursued.  She was continued on her current regimen of Hydralazine, Entresto and Spironolactone with Coreg being titrated to 50 mg twice daily.  In the interim, she was admitted to [facility] in 05/2018 for evaluation of chest pain and elevated blood pressure over the past several days. Troponin values were negative and EKG showed no acute ischemic changes. She was continued on Coreg 50mg  BID, Hydralazine 100mg  TID, Entresto 97-103mg  BID, and Spironolactone 50mg  daily with consideration of adding Amlodipine if BP remained elevated.    Past Medical History:  Diagnosis Date   Anemia    H&H of 10.6/33 and 07/2008 and 11.9/35 and 09/2010   Anxiety    CHF (congestive heart failure) ([place])    a. EF 40-45% by echo in 12/2015 with cath showing normal cors   Depression with anxiety    Diabetes mellitus without complication ([place])    Enlarged heart    Hypertension    Hypertensive heart disease 2009   Pulmonary edema postpartum; mild to moderate mitral regurgitation when hospitalized for CHF in 2009; Echocardiogram in 12/2009-no  MR and normal EF; normal CXR in 09/2010   Migraine headache    Miscarriage 03/19/2013   Obesity 04/16/2009   Osteoarthritis, knee 03/29/2011   Preeclampsia    Pregnant    Pulmonary edema    Sleep apnea    Threatened abortion in early pregnancy 03/15/2013    Past Surgical History:  Procedure Laterality Date   BREAST REDUCTION SURGERY  2002   CARDIAC CATHETERIZATION N/A 12/22/2015   Procedure: Left Heart Cath and Coronary Angiography;  Surgeon: Peter M Martinique, MD;  Location: [facility];  Service: Cardiovascular;  Laterality: N/A;   CESAREAN SECTION N/A 04/09/2014   Procedure: CESAREAN SECTION;  Surgeon: Mora Bellman, MD;  Location: [facility];  Service: Obstetrics;  Laterality: N/A;   CHOLECYSTECTOMY      Current Medications: Outpatient Medications Prior to Visit  Medication Sig Dispense Refill   albuterol (PROVENTIL HFA;VENTOLIN HFA) 108 (90 Base) MCG/ACT inhaler Inhale 1 puff into the lungs every 6 (six) hours as needed for wheezing or shortness of breath.     AMOXICILLIN PO Take 500 mg by mouth 2 (two) times daily.     ARIPiprazole (ABILIFY) 10 MG tablet Take 1 tablet (10 mg total) by mouth daily.     aspirin EC 81 MG tablet Take 81 mg by mouth daily.     atorvastatin (LIPITOR) 20 MG tablet Take 1 tablet (20 mg total) by mouth daily at 6 PM. 30 tablet 1   budesonide-formoterol (SYMBICORT) 80-4.5 MCG/ACT inhaler Inhale  2 puffs into the lungs 2 (two) times daily. 1 Inhaler 12   carvedilol (COREG) 25 MG tablet Take 2 tablets (50 mg total) by mouth 2 (two) times daily with a meal. 120 tablet 1   gabapentin (NEURONTIN) 600 MG tablet Take 600 mg by mouth 3 (three) times daily.      hydrALAZINE (APRESOLINE) 100 MG tablet Take 1 tablet (100 mg total) by mouth 3 (three) times daily. 30 tablet 1   insulin glargine (LANTUS) 100 UNIT/ML injection Inject 30 Units at bedtime into the skin.      insulin lispro (HUMALOG) 100 UNIT/ML injection Inject 15 Units into the skin  3 (three) times daily before meals.      Ipratropium-Albuterol (COMBIVENT RESPIMAT) 20-100 MCG/ACT AERS respimat Inhale 1 puff into the lungs every 6 (six) hours as needed for wheezing or shortness of breath. 1 Inhaler 0   metFORMIN (GLUCOPHAGE) 500 MG tablet Take 500 mg 2 (two) times daily with a meal by mouth.     methocarbamol (ROBAXIN) 500 MG tablet Take 1 tablet (500 mg total) by mouth every 8 (eight) hours as needed for muscle spasms. 8 tablet 0   nitroGLYCERIN (NITROSTAT) 0.4 MG SL tablet Place 1 tablet (0.4 mg total) under the tongue every 5 (five) minutes as needed for chest pain. 30 tablet 12   omeprazole (PRILOSEC) 40 MG capsule Take 40 mg by mouth daily.     ondansetron (ZOFRAN) 4 MG tablet Take 4 mg by mouth daily as needed for nausea or vomiting.     oxyCODONE-acetaminophen (PERCOCET/ROXICET) 5-325 MG tablet Take 1 tablet by mouth every 4 (four) hours as needed. 20 tablet 0   potassium chloride SA (K-DUR) 20 MEQ tablet Take 1 tablet (20 mEq total) by mouth 2 (two) times daily. 10 tablet 0   promethazine (PHENERGAN) 25 MG tablet Take 1 tablet (25 mg total) by mouth every 6 (six) hours as needed for nausea or vomiting. 30 tablet 0   pyridostigmine (MESTINON) 60 MG tablet Take 60 mg by mouth every 8 (eight) hours.     sacubitril-valsartan (ENTRESTO) 97-103 MG Take 1 tablet by mouth 2 (two) times daily. 60 tablet 0   torsemide (DEMADEX) 20 MG tablet Take 2 tablets (40 mg total) by mouth 2 (two) times daily. Please take 40 mg oral every morning and 20 mg every evening. (Patient taking differently: Take 40 mg by mouth 2 (two) times daily. ) 90 tablet 0   traZODone (DESYREL) 100 MG tablet Take 2 tablets (200 mg total) by mouth at bedtime.     UNABLE TO FIND Unsure of name-BID     No facility-administered medications prior to visit.      Allergies:   Diclofenac, Tramadol, Vicodin [hydrocodone-acetaminophen], and Hydrocodone bitartrate er   Social History   Socioeconomic  History   Marital status: Married    Spouse name: Not on file   Number of children: Not on file   Years of education: Not on file   Highest education level: Not on file  Occupational History   Occupation: unemployed    Fish farm manager: UNEMPLOYED  Social Designer, fashion/clothing strain: Not on file   Food insecurity    Worry: Not on file    Inability: Not on file   Transportation needs    Medical: Not on file    Non-medical: Not on file  Tobacco Use   Smoking status: Never Smoker   Smokeless tobacco: Never Used  Substance and Sexual Activity  Alcohol use: Yes    Comment: occ   Drug use: No   Sexual activity: Yes    Birth control/protection: Surgical    Comment: tubal  Lifestyle   Physical activity    Days per week: Not on file    Minutes per session: Not on file   Stress: Not on file  Relationships   Social connections    Talks on phone: Not on file    Gets together: Not on file    Attends religious service: Not on file    Active member of club or organization: Not on file    Attends meetings of clubs or organizations: Not on file    Relationship status: Not on file  Other Topics Concern   Not on file  Social History Narrative   Lives in [place]   Engaged/boyfriend (father to youngest chid)   4 children: daughter (76 as of 2013), sons (64, 30, 97 as of 2013)   Religion: christian     Family History:  The patient's ***family history includes ADD / ADHD in her son; Diabetes (age of onset: 48) in her mother; Heart attack in her brother; Heart disease in her father and mother; Heart disease (age of onset: 66) in her maternal grandmother; Hyperlipidemia in her paternal grandfather; Hypertension in her father, maternal uncle, and paternal grandfather.   Review of Systems:   Please see the history of present illness.     General:  No chills, fever, night sweats or weight changes.  Cardiovascular:  No chest pain, dyspnea on exertion, edema, orthopnea,  palpitations, paroxysmal nocturnal dyspnea. Dermatological: No rash, lesions/masses Respiratory: No cough, dyspnea Urologic: No hematuria, dysuria Abdominal:   No nausea, vomiting, diarrhea, bright red blood per rectum, melena, or hematemesis Neurologic:  No visual changes, wkns, changes in mental status. All other systems reviewed and are otherwise negative except as noted above.   Physical Exam:    VS:  There were no vitals taken for this visit.   General: Well developed, well nourished,female appearing in no acute distress. Head: Normocephalic, atraumatic, sclera non-icteric, no xanthomas, nares are without discharge.  Neck: No carotid bruits. JVD not elevated.  Lungs: Respirations regular and unlabored, without wheezes or rales.  Heart: ***Regular rate and rhythm. No S3 or S4.  No murmur, no rubs, or gallops appreciated. Abdomen: Soft, non-tender, non-distended with normoactive bowel sounds. No hepatomegaly. No rebound/guarding. No obvious abdominal masses. Msk:  Strength and tone appear normal for age. No joint deformities or effusions. Extremities: No clubbing or cyanosis. No edema.  Distal pedal pulses are 2+ bilaterally. Neuro: Alert and oriented X 3. Moves all extremities spontaneously. No focal deficits noted. Psych:  Responds to questions appropriately with a normal affect. Skin: No rashes or lesions noted  Wt Readings from Last 3 Encounters:  02/09/19 239 lb 3.2 oz (108.5 kg)  01/02/19 235 lb (106.6 kg)  10/29/18 227 lb 8.2 oz (103.2 kg)        Studies/Labs Reviewed:   EKG:  EKG is*** ordered today.  The ekg ordered today demonstrates ***  Recent Labs: 10/26/2018: Magnesium 1.5 02/06/2019: ALT 17; B Natriuretic Peptide 855.0; BUN 17; Creatinine, Ser 0.94; Hemoglobin 10.2; Platelets 278; Potassium 3.2; Sodium 135   Lipid Panel    Component Value Date/Time   CHOL 211 (H) 06/03/2018 0615   TRIG 223 (H) 06/03/2018 0615   HDL 39 (L) 06/03/2018 0615   CHOLHDL 5.4  06/03/2018 0615   VLDL 45 (H) 06/03/2018 0615   LDLCALC  127 (H) 06/03/2018 0615    Additional studies/ records that were reviewed today include:   Cardiac Catheterization: 12/2015  The left ventricular systolic function is normal.  LV end diastolic pressure is mildly elevated.  The left ventricular ejection fraction is 50-55% by visual estimate.   1. No significant CAD 2. Good LV function 3. Mildly elevated LV EDP.  Plan: medical therapy   Echocardiogram: 10/2018 IMPRESSIONS    1. The left ventricle has a visually estimated ejection fraction of 45%. The cavity size was normal. There is severe concentric left ventricular hypertrophy. Left ventricular diastolic Doppler parameters are consistent with pseudonormalization. Elevated  left ventricular end-diastolic pressure Left ventricular diffuse hypokinesis.  2. The right ventricle has normal systolic function. The cavity was normal. There is no increase in right ventricular wall thickness.  3. Left atrial size was severely dilated.  4. The mitral valve is grossly normal.  5. The tricuspid valve is grossly normal.  6. The aortic valve is tricuspid. Mild thickening of the aortic valve.  7. Moderate aortic annular calcification.  8. The aortic root is normal in size and structure.  Assessment:    No diagnosis found.   Plan:   In order of problems listed above:  1. ***    Medication Adjustments/Labs and Tests Ordered: Current medicines are reviewed at length with the patient today.  Concerns regarding medicines are outlined above.  Medication changes, Labs and Tests ordered today are listed in the Patient Instructions below. There are no Patient Instructions on file for this visit.   Signed, Erma Heritage, PA-C  03/01/2019 2:12 PM    [facility] [address] [place], [place] [address] Phone: [phone] Fax: [phone]

## 2019-03-02 ENCOUNTER — Telehealth: Payer: Self-pay | Admitting: *Deleted

## 2019-03-02 NOTE — Telephone Encounter (Signed)
Patient called requesting u/s results.

## 2019-03-02 NOTE — Telephone Encounter (Signed)
Called patient back and informed her of the ultrasound results. Plans to keep visit on Monday to discuss results and make plan of care with Dr. Glo Herring.

## 2019-03-05 ENCOUNTER — Ambulatory Visit: Payer: Medicaid Other | Admitting: Obstetrics and Gynecology

## 2019-03-08 ENCOUNTER — Ambulatory Visit: Payer: Medicaid Other | Admitting: Obstetrics and Gynecology

## 2019-03-13 NOTE — Progress Notes (Signed)
Referring Provider: Rosita Fire, MD Primary Care Physician:  Rosita Fire, MD Primary GI Physician: Dr. Oneida Alar  Chief Complaint  Patient presents with   Abdominal Pain   Nausea    with dry heaves after eating    HPI:   Chelsey Santiago is a 43 y.o. female presenting today at the request of Rosita Fire, MD for abdominal pain.  Past medical history significant for chronic anemia, diabetes, CHF, sleep apnea, and HTN. Patient was first seen in our office on 10/17/2017 for evaluation of IDA and also reported alternating constipation and diarrhea and solid food dysphagia.  She had history of low ferritin, 11 in 2017, 19 in December 2018. She is chronically anemic.  Hemoglobin in the 10 range with microcytic indices.  Also with history of menorrhagia which may be contributing to IDA.  Has been following with hematology and received iron infusions in December 2018 and January 2019.  Last ferritin 78 in February 2019.  Last saw hematology in March 2019.  Plans to obtain cardiac clearance for TCS and EGD/DIL.  Samples of Amitiza 8 mcg provided with request to call for prescription if helpful.  Cardiac clearance obtained on 10/18/2017 and clinical staff attempted to schedule patient for procedures; however, we were unable to get in touch with patient.  Patient no showed for appointment on 03/08/2018.   Admission on 10/26/2018 and 01/02/2019 for acute on chronic congestive heart failure.  Seen in the ED on 02/06/19 for left-sided abdominal pain with nausea and vomiting.  CT abdomen and pelvis with findings significant for solid mass felt to be arising from the left ovary measuring 9.3 x 6.2 x 7.7 cm.  Concern for ovarian neoplasm.  Also with prominent uterus containing calcification inhomogenous attenuation likely due to leiomyomatous change.  Follow-up outpatient transabdominal and transvaginal pelvic ultrasound on 02/16/2019 with large mass identified in left adnexa that favored arising from the left  lateral uterus though difficult to conclusively determine.  Patient no showed her follow-up OB/GYN appointment on 03/06/2019.  Per Dr. Johnnye Sima result note, ultrasound strongly suggestive of fibroid protruding from the uterus.  No suggestive findings of an abnormal ovary.  Today she states her blood pressure has been running high. She is under a lot of stress. States it is close to time for her next dose of medication. No headache, blurry vision, lightheadedness, dizziness, or feeling like she will pass out. For the last 3-4 weeks, she has had abdominal pain. When she eats, she has upper abdominal pain.  Reports she went to [facility] and they found nothing.  Then, she went to [facility], and they said she had a "stomach infection."  This was about 3 weeks ago. States she had a blood test and was told she had H. Pylori. Was put on 2 antibiotics and Prilosec. Antibiotics were for 14 days. Now taking Prilosec as needed. Mild improvement in pain. Will eat and stomach will hurt.  Associated nausea without vomiting. Some relief with belching. Feels like food gets stuck around xiphoid process at times. More with solid foods. Having reflux about 3-4 days a week. No blood in the stool. Reports very dark stools but not completely black. States this was only when she was taking antibiotics.   Bowels fluctuate. Typically more on the constipated side. Having to strain. Little "rat turds." Will go 2 days without a BM. Tried MiraLAX and states this did not work well. No diarrhea. Lower abdominal pain when not able to move her bowels well.  Taking Goody powders. Few times a week. Takes 2 at a time.    Past Medical History:  Diagnosis Date   Anemia    H&H of 10.6/33 and 07/2008 and 11.9/35 and 09/2010   Anxiety    CHF (congestive heart failure) ([place])    a. EF 40-45% by echo in 12/2015 with cath showing normal cors   Depression with anxiety    Diabetes mellitus without complication ([place])    Enlarged heart     Hypertension    Hypertensive heart disease 2009   Pulmonary edema postpartum; mild to moderate mitral regurgitation when hospitalized for CHF in 2009; Echocardiogram in 12/2009-no MR and normal EF; normal CXR in 09/2010   Migraine headache    Miscarriage 03/19/2013   Obesity 04/16/2009   Osteoarthritis, knee 03/29/2011   Preeclampsia    Pregnant    Pulmonary edema    Sleep apnea    Threatened abortion in early pregnancy 03/15/2013    Past Surgical History:  Procedure Laterality Date   BREAST REDUCTION SURGERY  2002   CARDIAC CATHETERIZATION N/A 12/22/2015   Procedure: Left Heart Cath and Coronary Angiography;  Surgeon: Peter M Martinique, MD;  Location: [facility];  Service: Cardiovascular;  Laterality: N/A;   CESAREAN SECTION N/A 04/09/2014   Procedure: CESAREAN SECTION;  Surgeon: Mora Bellman, MD;  Location: [facility];  Service: Obstetrics;  Laterality: N/A;   CHOLECYSTECTOMY      Current Outpatient Medications  Medication Sig Dispense Refill   albuterol (PROVENTIL HFA;VENTOLIN HFA) 108 (90 Base) MCG/ACT inhaler Inhale 1 puff into the lungs every 6 (six) hours as needed for wheezing or shortness of breath.     ARIPiprazole (ABILIFY) 10 MG tablet Take 1 tablet (10 mg total) by mouth daily.     aspirin EC 81 MG tablet Take 81 mg by mouth daily.     atorvastatin (LIPITOR) 20 MG tablet Take 1 tablet (20 mg total) by mouth daily at 6 PM. 30 tablet 1   budesonide-formoterol (SYMBICORT) 80-4.5 MCG/ACT inhaler Inhale 2 puffs into the lungs 2 (two) times daily. 1 Inhaler 12   clonazePAM (KLONOPIN) 0.5 MG tablet Take 0.5 mg by mouth 2 (two) times daily.     gabapentin (NEURONTIN) 600 MG tablet Take 600 mg by mouth 3 (three) times daily.      hydrALAZINE (APRESOLINE) 100 MG tablet Take 1 tablet (100 mg total) by mouth 3 (three) times daily. 30 tablet 1   insulin glargine (LANTUS) 100 UNIT/ML injection Inject 30 Units at bedtime into the skin.      insulin lispro  (HUMALOG) 100 UNIT/ML injection Inject 15 Units into the skin 3 (three) times daily before meals.      Ipratropium-Albuterol (COMBIVENT RESPIMAT) 20-100 MCG/ACT AERS respimat Inhale 1 puff into the lungs every 6 (six) hours as needed for wheezing or shortness of breath. 1 Inhaler 0   metFORMIN (GLUCOPHAGE) 500 MG tablet Take 500 mg 2 (two) times daily with a meal by mouth.     methocarbamol (ROBAXIN) 500 MG tablet Take 1 tablet (500 mg total) by mouth every 8 (eight) hours as needed for muscle spasms. 8 tablet 0   nitroGLYCERIN (NITROSTAT) 0.4 MG SL tablet Place 1 tablet (0.4 mg total) under the tongue every 5 (five) minutes as needed for chest pain. 30 tablet 12   ondansetron (ZOFRAN) 4 MG tablet Take 4 mg by mouth daily as needed for nausea or vomiting.     promethazine (PHENERGAN) 25 MG tablet Take 1  tablet (25 mg total) by mouth every 6 (six) hours as needed for nausea or vomiting. 30 tablet 0   pyridostigmine (MESTINON) 60 MG tablet Take 60 mg by mouth every 8 (eight) hours.     torsemide (DEMADEX) 20 MG tablet Take 2 tablets (40 mg total) by mouth 2 (two) times daily. Please take 40 mg oral every morning and 20 mg every evening. (Patient taking differently: Take 20 mg by mouth 4 (four) times daily. ) 90 tablet 0   traZODone (DESYREL) 100 MG tablet Take 2 tablets (200 mg total) by mouth at bedtime.     carvedilol (COREG) 25 MG tablet Take 2 tablets (50 mg total) by mouth 2 (two) times daily with a meal. 60 tablet 0   linaclotide (LINZESS) 145 MCG CAPS capsule Take 1 capsule (145 mcg total) by mouth daily before breakfast. 30 capsule 5   pantoprazole (PROTONIX) 40 MG tablet Take 1 tablet (40 mg total) by mouth 2 (two) times daily before a meal. 60 tablet 5   potassium chloride SA (KLOR-CON) 20 MEQ tablet Take 1 tablet (20 mEq total) by mouth 2 (two) times daily. 60 tablet 0   sacubitril-valsartan (ENTRESTO) 97-103 MG Take 1 tablet by mouth 2 (two) times daily. 60 tablet 6   No  current facility-administered medications for this visit.     Allergies as of 03/14/2019 - Review Complete 03/14/2019  Allergen Reaction Noted   Diclofenac Swelling 07/12/2011   Tramadol Nausea And Vomiting and Nausea Only 05/29/2011   Vicodin [hydrocodone-acetaminophen] Itching and Nausea Only 05/29/2011   Hydrocodone bitartrate er Itching 05/06/2017    Family History  Problem Relation Age of Onset   Diabetes Mother 85   Heart disease Mother    Hyperlipidemia Paternal Grandfather    Hypertension Paternal Grandfather    Heart disease Father    Hypertension Father    Heart disease Maternal Grandmother 25   ADD / ADHD Son    Hypertension Maternal Uncle    Heart attack Brother    Sudden death Neg Hx    Colon cancer Neg Hx    Celiac disease Neg Hx    Inflammatory bowel disease Neg Hx     Social History   Socioeconomic History   Marital status: Married    Spouse name: Not on file   Number of children: Not on file   Years of education: Not on file   Highest education level: Not on file  Occupational History   Occupation: unemployed    Fish farm manager: UNEMPLOYED  Social Designer, fashion/clothing strain: Not on file   Food insecurity    Worry: Not on file    Inability: Not on file   Transportation needs    Medical: Not on file    Non-medical: Not on file  Tobacco Use   Smoking status: Never Smoker   Smokeless tobacco: Never Used  Substance and Sexual Activity   Alcohol use: Yes    Comment: occ   Drug use: No   Sexual activity: Yes    Birth control/protection: Surgical    Comment: tubal  Lifestyle   Physical activity    Days per week: Not on file    Minutes per session: Not on file   Stress: Not on file  Relationships   Social connections    Talks on phone: Not on file    Gets together: Not on file    Attends religious service: Not on file    Active member of club or  organization: Not on file    Attends meetings of clubs or  organizations: Not on file    Relationship status: Not on file  Other Topics Concern   Not on file  Social History Narrative   Lives in [place]   Engaged/boyfriend (father to youngest chid)   4 children: daughter (32 as of 2013), sons (79, 53, 39 as of 2013)   Religion: christian    Review of Systems: Gen: Denies fever, chills CV: Denies chest pain, palpitations.  Resp: Denies dyspnea at rest or cough GI: See HPI Derm: Denies rash Psych: Denies depression, anxiety Heme: See HPI  Physical Exam: BP (!) 200/124    Pulse (!) 103    Temp 97.6 F (36.4 C) (Oral)    Ht 5\' 6"  (1.676 m)    Wt 237 lb 6.4 oz (107.7 kg)    LMP 03/09/2019    BMI 38.32 kg/m  General:   Alert and oriented. No distress noted. Pleasant and cooperative.  Head:  Normocephalic and atraumatic. Eyes:  Conjuctiva clear without scleral icterus. Heart:  S1, S2 present without murmurs appreciated. Lungs:  Clear to auscultation bilaterally. No wheezes, rales, or rhonchi. No distress.  Abdomen:  +BS, soft, and non-distended.  Tenderness to palpation in the epigastric area. No rebound or guarding. No HSM or masses noted. Msk:  Symmetrical without gross deformities. Normal posture. Extremities:  Without edema. Neurologic:  Alert and  oriented x4 Psych: Normal mood and affect.

## 2019-03-14 ENCOUNTER — Other Ambulatory Visit: Payer: Self-pay

## 2019-03-14 ENCOUNTER — Encounter: Payer: Self-pay | Admitting: Gastroenterology

## 2019-03-14 ENCOUNTER — Ambulatory Visit (INDEPENDENT_AMBULATORY_CARE_PROVIDER_SITE_OTHER): Payer: Medicaid Other | Admitting: Gastroenterology

## 2019-03-14 VITALS — BP 200/124 | HR 103 | Temp 97.6°F | Ht 66.0 in | Wt 237.4 lb

## 2019-03-14 DIAGNOSIS — D509 Iron deficiency anemia, unspecified: Secondary | ICD-10-CM | POA: Diagnosis not present

## 2019-03-14 DIAGNOSIS — R1013 Epigastric pain: Secondary | ICD-10-CM

## 2019-03-14 DIAGNOSIS — R1310 Dysphagia, unspecified: Secondary | ICD-10-CM

## 2019-03-14 DIAGNOSIS — R1010 Upper abdominal pain, unspecified: Secondary | ICD-10-CM | POA: Insufficient documentation

## 2019-03-14 DIAGNOSIS — I160 Hypertensive urgency: Secondary | ICD-10-CM

## 2019-03-14 DIAGNOSIS — K219 Gastro-esophageal reflux disease without esophagitis: Secondary | ICD-10-CM

## 2019-03-14 DIAGNOSIS — K5900 Constipation, unspecified: Secondary | ICD-10-CM

## 2019-03-14 MED ORDER — LINACLOTIDE 145 MCG PO CAPS: 145.0000 ug | ORAL_CAPSULE | Freq: Every day | ORAL | 5 refills | Status: DC

## 2019-03-14 MED ORDER — PANTOPRAZOLE SODIUM 40 MG PO TBEC: 40.0000 mg | DELAYED_RELEASE_TABLET | Freq: Two times a day (BID) | ORAL | 5 refills | Status: DC

## 2019-03-14 NOTE — Patient Instructions (Addendum)
You should go to the emergency department for evaluation of your blood pressure. It is very elevated today.   Have labs completed.  Please see cardiology in follow-up of your heart failure after recent hospitalization.   We will hold a spot for EGD +/- dilation and TCS in the near future with Dr. Oneida Alar 1 day before procedures: Lantus 15 units at bedtime, Humalog 1/2 dose, metformin 1/2 tab in the morning and evening AM of procedure: HOLD Lantus, Humalog, metformin Check blood sugar frequently. If any low sugars, correct with clear liquids.   Stop omeprazole and start Protonix 40 mg twice daily 30 minute breakfast and dinner.   Stop Goody powders. This is extremely important! The goody powders are likely causing inflammation and possibly ulcers in your stomach. Try tylenol for headaches.   Follow GERD diet. See handout below.   I am sending in Linzess 145 mcg for constipation. Please take once a day on an empty stomach.   We will see you back after your procedures. Call if questions or concerns prior.   Aliene Altes, PA-C [facility]      Food Choices for Gastroesophageal Reflux Disease, Adult When you have gastroesophageal reflux disease (GERD), the foods you eat and your eating habits are very important. Choosing the right foods can help ease your discomfort. Think about working with a nutrition specialist (dietitian) to help you make good choices. What are tips for following this plan?  Meals  Choose healthy foods that are low in fat, such as fruits, vegetables, whole grains, low-fat dairy products, and lean meat, fish, and poultry.  Eat small meals often instead of 3 large meals a day. Eat your meals slowly, and in a place where you are relaxed. Avoid bending over or lying down until 2-3 hours after eating.  Avoid eating meals 2-3 hours before bed.  Avoid drinking a lot of liquid with meals.  Cook foods using methods other than frying. Bake, grill, or  broil food instead.  Avoid or limit: ? Chocolate. ? Peppermint or spearmint. ? Alcohol. ? Pepper. ? Black and decaffeinated coffee. ? Black and decaffeinated tea. ? Bubbly (carbonated) soft drinks. ? Caffeinated energy drinks and soft drinks.  Limit high-fat foods such as: ? Fatty meat or fried foods. ? Whole milk, cream, butter, or ice cream. ? Nuts and nut butters. ? Pastries, donuts, and sweets made with butter or shortening.  Avoid foods that cause symptoms. These foods may be different for everyone. Common foods that cause symptoms include: ? Tomatoes. ? Oranges, lemons, and limes. ? Peppers. ? Spicy food. ? Onions and garlic. ? Vinegar. Lifestyle  Maintain a healthy weight. Ask your doctor what weight is healthy for you. If you need to lose weight, work with your doctor to do so safely.  Exercise for at least 30 minutes for 5 or more days each week, or as told by your doctor.  Wear loose-fitting clothes.  Do not smoke. If you need help quitting, ask your doctor.  Sleep with the head of your bed higher than your feet. Use a wedge under the mattress or blocks under the bed frame to raise the head of the bed. Summary  When you have gastroesophageal reflux disease (GERD), food and lifestyle choices are very important in easing your symptoms.  Eat small meals often instead of 3 large meals a day. Eat your meals slowly, and in a place where you are relaxed.  Limit high-fat foods such as fatty meat or fried foods.  Avoid bending over or lying down until 2-3 hours after eating.  Avoid peppermint and spearmint, caffeine, alcohol, and chocolate. This information is not intended to replace advice given to you by your health care provider. Make sure you discuss any questions you have with your health care provider. Document Released: 11/02/2011 Document Revised: 08/24/2018 Document Reviewed: 06/08/2016 Elsevier Patient Education  2020 [employer].

## 2019-03-15 ENCOUNTER — Encounter (HOSPITAL_COMMUNITY): Payer: Self-pay | Admitting: Emergency Medicine

## 2019-03-15 ENCOUNTER — Other Ambulatory Visit: Payer: Self-pay

## 2019-03-15 ENCOUNTER — Emergency Department (HOSPITAL_COMMUNITY): Payer: Medicaid Other

## 2019-03-15 ENCOUNTER — Emergency Department (HOSPITAL_COMMUNITY)
Admission: EM | Admit: 2019-03-15 | Discharge: 2019-03-15 | Disposition: A | Discharge status: Home / Self Care | Payer: Medicaid Other | Attending: Emergency Medicine | Admitting: Emergency Medicine

## 2019-03-15 DIAGNOSIS — E1140 Type 2 diabetes mellitus with diabetic neuropathy, unspecified: Secondary | ICD-10-CM | POA: Insufficient documentation

## 2019-03-15 DIAGNOSIS — I110 Hypertensive heart disease with heart failure: Secondary | ICD-10-CM | POA: Diagnosis present

## 2019-03-15 DIAGNOSIS — Z79899 Other long term (current) drug therapy: Secondary | ICD-10-CM | POA: Insufficient documentation

## 2019-03-15 DIAGNOSIS — E876 Hypokalemia: Secondary | ICD-10-CM | POA: Insufficient documentation

## 2019-03-15 DIAGNOSIS — D509 Iron deficiency anemia, unspecified: Secondary | ICD-10-CM | POA: Diagnosis not present

## 2019-03-15 DIAGNOSIS — I5042 Chronic combined systolic (congestive) and diastolic (congestive) heart failure: Secondary | ICD-10-CM | POA: Insufficient documentation

## 2019-03-15 DIAGNOSIS — T887XXA Unspecified adverse effect of drug or medicament, initial encounter: Secondary | ICD-10-CM | POA: Diagnosis not present

## 2019-03-15 DIAGNOSIS — I252 Old myocardial infarction: Secondary | ICD-10-CM | POA: Insufficient documentation

## 2019-03-15 DIAGNOSIS — Z20828 Contact with and (suspected) exposure to other viral communicable diseases: Secondary | ICD-10-CM | POA: Insufficient documentation

## 2019-03-15 DIAGNOSIS — I10 Essential (primary) hypertension: Secondary | ICD-10-CM

## 2019-03-15 DIAGNOSIS — Z7982 Long term (current) use of aspirin: Secondary | ICD-10-CM | POA: Insufficient documentation

## 2019-03-15 DIAGNOSIS — Y69 Unspecified misadventure during surgical and medical care: Secondary | ICD-10-CM | POA: Insufficient documentation

## 2019-03-15 DIAGNOSIS — T502X5A Adverse effect of carbonic-anhydrase inhibitors, benzothiadiazides and other diuretics, initial encounter: Secondary | ICD-10-CM | POA: Insufficient documentation

## 2019-03-15 DIAGNOSIS — Z794 Long term (current) use of insulin: Secondary | ICD-10-CM | POA: Insufficient documentation

## 2019-03-15 LAB — URINALYSIS, ROUTINE W REFLEX MICROSCOPIC
Bacteria, UA: NONE SEEN
Bilirubin Urine: NEGATIVE
Glucose, UA: NEGATIVE mg/dL
Ketones, ur: NEGATIVE mg/dL
Leukocytes,Ua: NEGATIVE
Nitrite: NEGATIVE
Protein, ur: NEGATIVE mg/dL
Specific Gravity, Urine: 1.006 (ref 1.005–1.030)
pH: 7 (ref 5.0–8.0)

## 2019-03-15 LAB — CBC WITH DIFFERENTIAL/PLATELET
Abs Immature Granulocytes: 0.03 10*3/uL (ref 0.00–0.07)
Basophils Absolute: 0.1 10*3/uL (ref 0.0–0.1)
Basophils Relative: 1 %
Eosinophils Absolute: 0.1 10*3/uL (ref 0.0–0.5)
Eosinophils Relative: 1 %
HCT: 33.6 % — ABNORMAL LOW (ref 36.0–46.0)
Hemoglobin: 10.1 g/dL — ABNORMAL LOW (ref 12.0–15.0)
Immature Granulocytes: 0 %
Lymphocytes Relative: 20 %
Lymphs Abs: 2.1 10*3/uL (ref 0.7–4.0)
MCH: 22.9 pg — ABNORMAL LOW (ref 26.0–34.0)
MCHC: 30.1 g/dL (ref 30.0–36.0)
MCV: 76 fL — ABNORMAL LOW (ref 80.0–100.0)
Monocytes Absolute: 0.5 10*3/uL (ref 0.1–1.0)
Monocytes Relative: 4 %
Neutro Abs: 7.7 10*3/uL (ref 1.7–7.7)
Neutrophils Relative %: 74 %
Platelets: 279 10*3/uL (ref 150–400)
RBC: 4.42 MIL/uL (ref 3.87–5.11)
RDW: 15.8 % — ABNORMAL HIGH (ref 11.5–15.5)
WBC: 10.4 10*3/uL (ref 4.0–10.5)
nRBC: 0 % (ref 0.0–0.2)

## 2019-03-15 LAB — COMPREHENSIVE METABOLIC PANEL
ALT: 18 U/L (ref 0–44)
AST: 22 U/L (ref 15–41)
Albumin: 3.6 g/dL (ref 3.5–5.0)
Alkaline Phosphatase: 75 U/L (ref 38–126)
Anion gap: 13 (ref 5–15)
BUN: 19 mg/dL (ref 6–20)
CO2: 28 mmol/L (ref 22–32)
Calcium: 8.5 mg/dL — ABNORMAL LOW (ref 8.9–10.3)
Chloride: 96 mmol/L — ABNORMAL LOW (ref 98–111)
Creatinine, Ser: 0.98 mg/dL (ref 0.44–1.00)
GFR calc Af Amer: >60 mL/min (ref >60)
GFR calc non Af Amer: >60 mL/min (ref >60)
Glucose, Bld: 237 mg/dL — ABNORMAL HIGH (ref 70–99)
Potassium: 2.8 mmol/L — ABNORMAL LOW (ref 3.5–5.1)
Sodium: 137 mmol/L (ref 135–145)
Total Bilirubin: 0.5 mg/dL (ref 0.3–1.2)
Total Protein: 7.7 g/dL (ref 6.5–8.1)

## 2019-03-15 LAB — MAGNESIUM: Magnesium: 1.5 mg/dL — ABNORMAL LOW (ref 1.7–2.4)

## 2019-03-15 LAB — POC URINE PREG, ED: Preg Test, Ur: NEGATIVE

## 2019-03-15 LAB — TROPONIN I (HIGH SENSITIVITY)
Troponin I (High Sensitivity): 27 ng/L — ABNORMAL HIGH (ref <18)
Troponin I (High Sensitivity): 29 ng/L — ABNORMAL HIGH (ref <18)

## 2019-03-15 LAB — BRAIN NATRIURETIC PEPTIDE: B Natriuretic Peptide: 505 pg/mL — ABNORMAL HIGH (ref 0.0–100.0)

## 2019-03-15 LAB — SARS CORONAVIRUS 2 (TAT 6-24 HRS): SARS Coronavirus 2: NEGATIVE

## 2019-03-15 MED ORDER — ENTRESTO 97-103 MG PO TABS: 1.0000 | ORAL_TABLET | Freq: Two times a day (BID) | ORAL | 0 refills | Status: DC

## 2019-03-15 MED ORDER — MAGNESIUM SULFATE 2 GM/50ML IV SOLN
2.0000 g | Freq: Once | INTRAVENOUS | Status: AC
  Administered 2019-03-15: 2 g via INTRAVENOUS
  Filled 2019-03-15: qty 50

## 2019-03-15 MED ORDER — ACETAMINOPHEN 325 MG PO TABS
650.0000 mg | ORAL_TABLET | Freq: Once | ORAL | Status: AC
  Administered 2019-03-15: 650 mg via ORAL
  Filled 2019-03-15: qty 2

## 2019-03-15 MED ORDER — LABETALOL HCL 5 MG/ML IV SOLN
20.0000 mg | Freq: Once | INTRAVENOUS | Status: AC
  Administered 2019-03-15: 20 mg via INTRAVENOUS
  Filled 2019-03-15: qty 4

## 2019-03-15 MED ORDER — POTASSIUM CHLORIDE CRYS ER 20 MEQ PO TBCR: 20.0000 meq | EXTENDED_RELEASE_TABLET | Freq: Two times a day (BID) | ORAL | 0 refills | Status: DC

## 2019-03-15 MED ORDER — POTASSIUM CHLORIDE 10 MEQ/100ML IV SOLN
10.0000 meq | INTRAVENOUS | Status: AC
  Administered 2019-03-15 (×2): 10 meq via INTRAVENOUS
  Filled 2019-03-15 (×2): qty 100

## 2019-03-15 MED ORDER — SACUBITRIL-VALSARTAN 97-103 MG PO TABS
1.0000 | ORAL_TABLET | Freq: Once | ORAL | Status: AC
  Administered 2019-03-15: 05:00:00 1 via ORAL
  Filled 2019-03-15: qty 1

## 2019-03-15 MED ORDER — POTASSIUM CHLORIDE CRYS ER 20 MEQ PO TBCR
40.0000 meq | EXTENDED_RELEASE_TABLET | Freq: Once | ORAL | Status: AC
  Administered 2019-03-15: 07:00:00 40 meq via ORAL
  Filled 2019-03-15: qty 2

## 2019-03-15 MED ORDER — CARVEDILOL 25 MG PO TABS: 50.0000 mg | ORAL_TABLET | Freq: Two times a day (BID) | ORAL | 0 refills | Status: DC

## 2019-03-15 MED ORDER — CARVEDILOL 12.5 MG PO TABS
50.0000 mg | ORAL_TABLET | Freq: Once | ORAL | Status: AC
  Administered 2019-03-15: 05:00:00 50 mg via ORAL
  Filled 2019-03-15: qty 4

## 2019-03-15 NOTE — ED Notes (Addendum)
Pt reports she has been out of entresto for about 1-1/2 weeks. Pt ran out of carvedilol yesterday (03/14/2019) after taking morning dose. Dr Roxanne Mins aware. Pt denies shortness of breath or headache, pt does report vision is starting to get a little blurry. PT does report "a little chest tightness" and LE edema.

## 2019-03-15 NOTE — ED Provider Notes (Signed)
[facility] EMERGENCY DEPARTMENT Provider Note   CSN: [id number] Arrival date & time: 03/15/19  [id number]    History   Chief Complaint Chief Complaint  Patient presents with   Hypertension    HPI Chelsey Santiago is a 43 y.o. female.   The history is provided by the patient.  Hypertension  She has history of hypertension, diabetes, combined systolic and diastolic heart failure and comes in because of very high blood pressures at home.  Blood pressures have been running high for about a month but got very high today with highest reading of 224/147.  She does admit to vision being slightly blurred but denies any headache or tinnitus.  She denies dyspnea but has had some tight feeling in her chest.  She has chronic leg edema which is, perhaps, slightly worse than normal.  She ran out of her subcubitril-valsartan about 10 days ago, and ran out of her carvedilol today.  Past Medical History:  Diagnosis Date   Anemia    H&H of 10.6/33 and 07/2008 and 11.9/35 and 09/2010   Anxiety    CHF (congestive heart failure) ([place])    a. EF 40-45% by echo in 12/2015 with cath showing normal cors   Depression with anxiety    Diabetes mellitus without complication ([place])    Enlarged heart    Hypertension    Hypertensive heart disease 2009   Pulmonary edema postpartum; mild to moderate mitral regurgitation when hospitalized for CHF in 2009; Echocardiogram in 12/2009-no MR and normal EF; normal CXR in 09/2010   Migraine headache    Miscarriage 03/19/2013   Obesity 04/16/2009   Osteoarthritis, knee 03/29/2011   Preeclampsia    Pregnant    Pulmonary edema    Sleep apnea    Threatened abortion in early pregnancy 03/15/2013    Patient Active Problem List   Diagnosis Date Noted   Abdominal pain, epigastric 03/14/2019   Dysphagia 03/14/2019   Gastroesophageal reflux disease 03/14/2019   Class 2 obesity    Acute exacerbation of CHF (congestive heart failure) ([place]) 10/26/2018    Chronic combined systolic and diastolic CHF (congestive heart failure) ([place]) 06/02/2018   Uncontrolled type 2 diabetes mellitus with hyperglycemia ([place]) 06/02/2018   Influenza B 05/13/2018   Hypomagnesemia 05/12/2018   Headache 05/12/2018   Upper respiratory tract infection    HCAP (healthcare-associated pneumonia)    Constipation 10/17/2017   Bad headache    CHF exacerbation ([place]) 09/29/2017   Iron deficiency anemia 05/16/2017   Vitamin D deficiency 11/25/2016   Iron deficiency 11/25/2016   History of acute myocardial infarction 10/05/2016   CHF (congestive heart failure) ([place]) 05/06/2016   Diabetes mellitus with complication ([place]) [id number]   Leukocytosis 05/06/2016   Neuropathy 05/06/2016   Depression 04/16/2016   Chronic tension-type headache, not intractable 04/16/2016   AKI (acute kidney injury) ([place])    Hyperkalemia    Nonischemic cardiomyopathy ([place])    Acute on chronic combined systolic and diastolic ACC/AHA stage C congestive heart failure ([place]) 04/03/2016   Acute on chronic combined systolic and diastolic CHF, NYHA class 4 ([place]) 04/03/2016   Hypertensive emergency 04/03/2016   Cardiomyopathy due to hypertension ([place]) 12/22/2015   Normal coronary arteries 12/22/2015   Troponin level elevated 12/22/2015   NSTEMI (non-ST elevated myocardial infarction) ([place]) 12/20/2015   Dental infection 10/10/2015   Chest pain [id number]   Systolic CHF, chronic ([place]) 09/05/2015   LLQ pain    Type 2 diabetes mellitus without complication ([place]) [id number]   Essential hypertension  Resistant hypertension 04/23/2014   Hypertensive urgency 04/22/2014   Acute CHF ([place]) 04/22/2014   S/P cesarean section 04/11/2014   Acute pulmonary edema ([place]) 04/11/2014   Postoperative anemia 04/11/2014   Elevated serum creatinine 04/11/2014   Preeclampsia, severe 04/09/2014   Pre-eclampsia superimposed on chronic hypertension, antepartum 04/08/2014    Dyspnea 04/08/2014   Polyhydramnios in third trimester, antepartum 03/14/2014   Abnormal maternal glucose tolerance, antepartum 03/11/2014   High-risk pregnancy 03/11/2014   Pre-existing essential hypertension complicating pregnancy 0000000   Impaired glucose tolerance during pregnancy, antepartum 11/27/2013   Leiomyoma of uterus 11/22/2013   History of gestational diabetes in prior pregnancy, currently pregnant in first trimester 11/22/2013   Hx of preeclampsia, prior pregnancy, currently pregnant 11/22/2013   Short interval between pregnancies affecting pregnancy, antepartum 11/22/2013   Supervision of high-risk pregnancy of elderly primigravida (>= 73 years old at delivery), third trimester 11/22/2013   Miscarriage 03/19/2013   Major depressive disorder, single episode, unspecified 09/27/2011   Hypertension    Hypertensive cardiovascular disease    Microcytic anemia    Osteoarthrosis involving lower leg 03/29/2011   Hypokalemia 12/12/2009   OSA on CPAP 12/09/2009   Morbid obesity ([place]) 04/16/2009    Past Surgical History:  Procedure Laterality Date   BREAST REDUCTION SURGERY  2002   CARDIAC CATHETERIZATION N/A 12/22/2015   Procedure: Left Heart Cath and Coronary Angiography;  Surgeon: Peter M Martinique, MD;  Location: [facility];  Service: Cardiovascular;  Laterality: N/A;   CESAREAN SECTION N/A 04/09/2014   Procedure: CESAREAN SECTION;  Surgeon: Mora Bellman, MD;  Location: [facility];  Service: Obstetrics;  Laterality: N/A;   CHOLECYSTECTOMY       OB History    Gravida  11   Para  6   Term  5   Preterm  1   AB  5   Living  6     SAB  3   TAB  2   Ectopic      Multiple  0   Live Births  6            Home Medications    Prior to Admission medications   Medication Sig Start Date End Date Taking? Authorizing Provider  albuterol (PROVENTIL HFA;VENTOLIN HFA) 108 (90 Base) MCG/ACT inhaler Inhale 1 puff into the lungs every 6  (six) hours as needed for wheezing or shortness of breath.    [provider]  ARIPiprazole (ABILIFY) 10 MG tablet Take 1 tablet (10 mg total) by mouth daily. 10/30/18   Barton Dubois, MD  aspirin EC 81 MG tablet Take 81 mg by mouth daily.    [provider]  atorvastatin (LIPITOR) 20 MG tablet Take 1 tablet (20 mg total) by mouth daily at 6 PM. 06/03/18   Tat, Shanon Brow, MD  budesonide-formoterol [facility]) 80-4.5 MCG/ACT inhaler Inhale 2 puffs into the lungs 2 (two) times daily. 05/04/18   Manuella Ghazi, Pratik D, DO  carvedilol (COREG) 25 MG tablet Take 2 tablets (50 mg total) by mouth 2 (two) times daily with a meal. 06/03/18   Tat, Shanon Brow, MD  clonazePAM (KLONOPIN) 0.5 MG tablet Take 0.5 mg by mouth 2 (two) times daily. 12/19/18   [provider]  gabapentin (NEURONTIN) 600 MG tablet Take 600 mg by mouth 3 (three) times daily.     [provider]  hydrALAZINE (APRESOLINE) 100 MG tablet Take 1 tablet (100 mg total) by mouth 3 (three) times daily. 06/03/18   Orson Eva, MD  insulin  glargine (LANTUS) 100 UNIT/ML injection Inject 30 Units at bedtime into the skin.     [provider]  insulin lispro (HUMALOG) 100 UNIT/ML injection Inject 15 Units into the skin 3 (three) times daily before meals.     [provider]  Ipratropium-Albuterol (COMBIVENT RESPIMAT) 20-100 MCG/ACT AERS respimat Inhale 1 puff into the lungs every 6 (six) hours as needed for wheezing or shortness of breath. 05/04/18   Manuella Ghazi, Pratik D, DO  linaclotide Rolan Lipa) 145 MCG CAPS capsule Take 1 capsule (145 mcg total) by mouth daily before breakfast. 03/14/19   Erenest Rasher, PA-C  metFORMIN (GLUCOPHAGE) 500 MG tablet Take 500 mg 2 (two) times daily with a meal by mouth.    [provider]  methocarbamol (ROBAXIN) 500 MG tablet Take 1 tablet (500 mg total) by mouth every 8 (eight) hours as needed for muscle spasms. 01/02/19   Davonna Belling, MD  nitroGLYCERIN (NITROSTAT) 0.4 MG SL  tablet Place 1 tablet (0.4 mg total) under the tongue every 5 (five) minutes as needed for chest pain. 09/05/15   Kathie Dike, MD  ondansetron (ZOFRAN) 4 MG tablet Take 4 mg by mouth daily as needed for nausea or vomiting.    [provider]  pantoprazole (PROTONIX) 40 MG tablet Take 1 tablet (40 mg total) by mouth 2 (two) times daily before a meal. 03/14/19   Erenest Rasher, PA-C  potassium chloride SA (K-DUR) 20 MEQ tablet Take 1 tablet (20 mEq total) by mouth 2 (two) times daily. 01/02/19   Davonna Belling, MD  promethazine (PHENERGAN) 25 MG tablet Take 1 tablet (25 mg total) by mouth every 6 (six) hours as needed for nausea or vomiting. 02/06/19   Idol, Almyra Free, PA-C  pyridostigmine (MESTINON) 60 MG tablet Take 60 mg by mouth every 8 (eight) hours.    [provider]  sacubitril-valsartan (ENTRESTO) 97-103 MG Take 1 tablet by mouth 2 (two) times daily. 04/09/16   Arrien, Jimmy Picket, MD  torsemide (DEMADEX) 20 MG tablet Take 2 tablets (40 mg total) by mouth 2 (two) times daily. Please take 40 mg oral every morning and 20 mg every evening. Patient taking differently: Take 20 mg by mouth 4 (four) times daily.  10/29/18 03/14/19  Barton Dubois, MD  traZODone (DESYREL) 100 MG tablet Take 2 tablets (200 mg total) by mouth at bedtime. 10/29/18   Barton Dubois, MD    Family History Family History  Problem Relation Age of Onset   Diabetes Mother 62   Heart disease Mother    Hyperlipidemia Paternal Grandfather    Hypertension Paternal Grandfather    Heart disease Father    Hypertension Father    Heart disease Maternal Grandmother 71   ADD / ADHD Son    Hypertension Maternal Uncle    Heart attack Brother    Sudden death Neg Hx    Colon cancer Neg Hx    Celiac disease Neg Hx    Inflammatory bowel disease Neg Hx     Social History Social History   Tobacco Use   Smoking status: Never Smoker   Smokeless tobacco: Never Used  Substance Use Topics    Alcohol use: Yes    Comment: occ   Drug use: No     Allergies   Diclofenac, Tramadol, Vicodin [hydrocodone-acetaminophen], and Hydrocodone bitartrate er   Review of Systems Review of Systems  All other systems reviewed and are negative.    Physical Exam Updated Vital Signs BP (!) 228/148  Pulse (!) 115    Temp 97.7 F (36.5 C)    Resp 18    Ht 5\' 6"  (1.676 m)    Wt 107.5 kg    LMP 03/09/2019    SpO2 98%    BMI 38.25 kg/m   Physical Exam Vitals signs and nursing note reviewed.    43 year old female, resting comfortably and in no acute distress. Vital signs are significant for elevated heart rate and severely elevated blood pressure. Oxygen saturation is 96%, which is normal. Head is normocephalic and atraumatic. PERRLA, EOMI. Oropharynx is clear.  Unable to visualize fundi. Neck is nontender and supple without adenopathy or JVD. Back is nontender and there is no CVA tenderness. Lungs are clear without rales, wheezes, or rhonchi. Chest is nontender. Heart has regular rate and rhythm without murmur. Abdomen is soft, flat, nontender without masses or hepatosplenomegaly and peristalsis is normoactive. Extremities have 2+ edema, full range of motion is present. Skin is warm and dry without rash. Neurologic: Mental status is normal, cranial nerves are intact, there are no motor or sensory deficits.  ED Treatments / Results  Labs (all labs ordered are listed, but only abnormal results are displayed) Labs Reviewed  CBC WITH DIFFERENTIAL/PLATELET - Abnormal; Notable for the following components:      Result Value   Hemoglobin 10.1 (*)    HCT 33.6 (*)    MCV 76.0 (*)    MCH 22.9 (*)    RDW 15.8 (*)    All other components within normal limits  BRAIN NATRIURETIC PEPTIDE - Abnormal; Notable for the following components:   B Natriuretic Peptide 505.0 (*)    All other components within normal limits  COMPREHENSIVE METABOLIC PANEL - Abnormal; Notable for the following  components:   Potassium 2.8 (*)    Chloride 96 (*)    Glucose, Bld 237 (*)    Calcium 8.5 (*)    All other components within normal limits  URINALYSIS, ROUTINE W REFLEX MICROSCOPIC - Abnormal; Notable for the following components:   Color, Urine STRAW (*)    Hgb urine dipstick MODERATE (*)    All other components within normal limits  MAGNESIUM - Abnormal; Notable for the following components:   Magnesium 1.5 (*)    All other components within normal limits  TROPONIN I (HIGH SENSITIVITY) - Abnormal; Notable for the following components:   Troponin I (High Sensitivity) 27.0 (*)    All other components within normal limits  TROPONIN I (HIGH SENSITIVITY) - Abnormal; Notable for the following components:   Troponin I (High Sensitivity) 29 (*)    All other components within normal limits  SARS CORONAVIRUS 2 (TAT 6-24 HRS)  POC URINE PREG, ED    EKG EKG Interpretation  Date/Time:  Thursday March 15 2019 03:20:42 EDT Ventricular Rate:  103 PR Interval:  164 QRS Duration: 98 QT Interval:  392 QTC Calculation: 513 R Axis:   146 Text Interpretation: Sinus tachycardia Right atrial enlargement Right axis deviation Pulmonary disease pattern Abnormal ECG Prolonged QT When compared with ECG of 01/02/2019, No significant change was found Confirmed by Delora Fuel ([id number]) on 03/15/2019 3:24:40 AM   Radiology Dg Chest Port 1 View  Result Date: 03/15/2019 CLINICAL DATA:  Severe hypertension EXAM: PORTABLE CHEST 1 VIEW COMPARISON:  01/02/2019 FINDINGS: Cardiomegaly without failure. No pneumonia, effusion, or pneumothorax. Unremarkable aortic and hilar contours IMPRESSION: Chronic cardiomegaly without failure. Electronically Signed   By: Monte Fantasia M.D.   On: 03/15/2019 04:52  Procedures Procedures  CRITICAL CARE Performed by: Delora Fuel Total critical care time: 45 minutes Critical care time was exclusive of separately billable procedures and treating other patients. Critical  care was necessary to treat or prevent imminent or life-threatening deterioration. Critical care was time spent personally by me on the following activities: development of treatment plan with patient and/or surrogate as well as nursing, discussions with consultants, evaluation of patient's response to treatment, examination of patient, obtaining history from patient or surrogate, ordering and performing treatments and interventions, ordering and review of laboratory studies, ordering and review of radiographic studies, pulse oximetry and re-evaluation of patient's condition.  Medications Ordered in ED Medications  potassium chloride 10 mEq in 100 mL IVPB (has no administration in time range)  potassium chloride SA (KLOR-CON) CR tablet 40 mEq (has no administration in time range)  labetalol (NORMODYNE) injection 20 mg (has no administration in time range)  acetaminophen (TYLENOL) tablet 650 mg (has no administration in time range)  sacubitril-valsartan (ENTRESTO) 97-103 mg per tablet (1 tablet Oral Given 03/15/19 0448)  carvedilol (COREG) tablet 50 mg (50 mg Oral Given 03/15/19 0446)     Initial Impression / Assessment and Plan / ED Course  I have reviewed the triage vital signs and the nursing notes.  Pertinent labs & imaging results that were available during my care of the patient were reviewed by me and considered in my medical decision making (see chart for details).  Severe hypertension which is, at least partially, secondary to poor medication compliance.  No obvious signs of endorgan damage as worsening edema could be related to her running out of her medications.  Old records are reviewed and she did have a blood pressure of 200/124 at her gastroenterologist office yesterday, was 175/106 on 02/09/2019 at her gynecologist's office.  Prior to that, blood pressures were not severely elevated.  She will be given dose of her subcubitril-losartan and carvedilol and will check screening labs to  see if there is any evidence of endorgan damage.  6:23 AM Blood pressure has come down to 171/111.  Troponin is mildly elevated at 27 and BNP is mildly elevated at 505.  This is actually a drop from 855 for BNP.  However, she is severely hypokalemic with potassium 2.8 and she is given both intravenous and oral potassium.  Mild anemia is present and is unchanged from baseline.  ECG does show a prolonged QT interval but is unchanged from prior.  Chest x-ray shows stable cardiomegaly.  She is given a dose of labetalol for her blood pressure.  Hospitalization was recommended, but patient is resistant.  Will check delta troponin.  If not rising, she may be able to be discharged with close follow-up with PCP and cardiology.  Repeat troponin is essentially unchanged.  Magnesium has come back low and she is being given a dose of intravenous magnesium.  Prescription refills have been sent in for carvedilol, K. Dur, and sacubitril-losartan.  Her blood pressure will be monitored while she is getting her intravenous potassium and magnesium.  Case is signed out to Dr. Eulis Foster.  Final Clinical Impressions(s) / ED Diagnoses   Final diagnoses:  Poorly-controlled hypertension  Diuretic-induced hypokalemia  Hypomagnesemia  Microcytic anemia    ED Discharge Orders    None       Delora Fuel, MD [phone]

## 2019-03-15 NOTE — ED Triage Notes (Signed)
Pt states that her BP at home was 224/147. States that it has been high x 4 weeks, just not that high.

## 2019-03-15 NOTE — ED Notes (Signed)
Lab made aware of pt needing blood draw. Tye Maryland, RN, [facility] made aware of need for medication Delene Loll)

## 2019-03-15 NOTE — Discharge Instructions (Addendum)
Pick up your prescriptions at your pharmacy today and take them as directed.  Follow-up with your doctor for checkup in 1 week, and as needed.

## 2019-03-15 NOTE — ED Notes (Signed)
Pt ambulatory to bathroom and back to room 

## 2019-03-15 NOTE — ED Provider Notes (Addendum)
7:45 AM-checkout from Dr. Roxanne Mins to evaluate after observation.  Patient admits to medication noncompliance.  Patient treated for hypomagnesemia and hypokalemia in the ED, as well as hypertension.  Medications  magnesium sulfate IVPB 2 g 50 mL (has no administration in time range)  sacubitril-valsartan (ENTRESTO) 97-103 mg per tablet (1 tablet Oral Given 03/15/19 0448)  carvedilol (COREG) tablet 50 mg (50 mg Oral Given 03/15/19 0446)  potassium chloride 10 mEq in 100 mL IVPB (10 mEq Intravenous New Bag/Given 03/15/19 0847)  potassium chloride SA (KLOR-CON) CR tablet 40 mEq (40 mEq Oral Given 03/15/19 0700)  labetalol (NORMODYNE) injection 20 mg (20 mg Intravenous Given 03/15/19 0714)  acetaminophen (TYLENOL) tablet 650 mg (650 mg Oral Given 03/15/19 0658)     Patient Vitals for the past 24 hrs:  BP Temp Pulse Resp SpO2 Height Weight  03/15/19 0830 (!) 166/113 - 72 16 93 % - -  03/15/19 0800 (!) 160/108 - 75 - 97 % - -  03/15/19 0740 (!) 158/104 - 73 17 95 % - -  03/15/19 0700 (!) 156/104 - 81 17 97 % - -  03/15/19 0600 (!) 171/111 - 87 17 94 % - -  03/15/19 0446 (!) 186/116 - 95 17 100 % - -  03/15/19 0400 (!) 190/125 - 97 18 98 % - -  03/15/19 0343 (!) 215/143 - (!) 115 18 97 % - -  03/15/19 0317 - - - - - 5\' 6"  (1.676 m) 107.5 kg  03/15/19 0316 (!) 228/148 97.7 F (36.5 C) (!) 115 18 98 % - -    10:19 AM Reevaluation with update and discussion. After initial assessment and treatment, an updated evaluation reveals patient is comfortable, eating and tolerating it.  Blood pressure better 143/93 at this time.  Findings discussed with the patient and all questions were answered. Daleen Bo   Medical Decision Making: Hypertensive urgency, improved after treatment with usual medications and supplemental labetalol.  Incidental hypokalemia, treated.  Incidental hypomagnesemia, treated.  Doubt ACS, PE, pneumonia or CVA.  CRITICAL CARE-no Performed by: Daleen Bo  Nursing Notes  Reviewed/ Care Coordinated Applicable Imaging Reviewed Interpretation of Laboratory Data incorporated into ED treatment  The patient appears reasonably screened and/or stabilized for discharge and I doubt any other medical condition or other [facility] requiring further screening, evaluation, or treatment in the ED at this time prior to discharge.  Plan: Home Medications-continue usual, pick up prescriptions which were sent to pharmacy; Home Treatments-low-salt diet; return here if the recommended treatment, does not improve the symptoms; Recommended follow up-PCP checkup 1 week and as needed       Daleen Bo, MD 03/15/19 1025    Daleen Bo, MD 03/15/19 [phone]

## 2019-03-16 ENCOUNTER — Telehealth: Payer: Self-pay | Admitting: Cardiology

## 2019-03-16 MED ORDER — ENTRESTO 97-103 MG PO TABS: 1.0000 | ORAL_TABLET | Freq: Two times a day (BID) | ORAL | 6 refills | Status: DC

## 2019-03-16 NOTE — Telephone Encounter (Signed)
Pt is wanting to know if she can get a refill on her [facility] sent in.

## 2019-03-16 NOTE — Telephone Encounter (Signed)
Refilled to [facility] °

## 2019-03-18 NOTE — Assessment & Plan Note (Signed)
Patient's blood pressure significantly elevated today at 200/124.  Pulse 103.  Patient is asymptomatic without headache, blurry vision, lightheadedness, dizziness, chest pain, or shortness of breath.  She was advised to proceed to the emergency department immediately after leaving our office for further evaluation.

## 2019-03-18 NOTE — Assessment & Plan Note (Signed)
Epigastric pain x3-4 weeks.  Worse postprandially.  Reports being diagnosed with H. pylori at [facility] 3 weeks ago.  She reports completing 14 days of antibiotics and Prilosec.  Now taking Prilosec as needed.  She has had mild improvement of epigastric pain.  Associated nausea without vomiting.  Also with GERD symptoms 3-4 days a week and solid food dysphagia.  Denies BRBPR.  No true melena.  No unintentional weight loss.  Taking to [place] powders a few times a week.  Abdominal exam with epigastric tenderness to palpation.  Past medical history significant for IDA requiring iron infusions in the past.  Last infusion in January 2019.  She is chronically anemic with hemoglobin in the 10 range.  Last ferritin 78 in February 2019.  Differentials include NSAID induced gastritis, esophagitis, duodenitis, PUD.  Possible ongoing H. pylori infection.   Hold a spot for EGD +/-dilation with biopsies to evaluate status of H. pylori eradication with propofol with Dr. Oneida Alar.  We will officially schedule procedure following cardiology follow-up due to significantly elevated blood pressure and 2 recent hospitalizations for acute on chronic CHF.  Patient was also advised to proceed to the emergency department today for evaluation of hypertensive urgency. Update CBC and iron panel. Stop omeprazole and start Protonix 40 mg twice daily. Stop Goody powders.  Avoid all NSAIDs. Follow GERD diet.  Handout provided. Follow-up after procedures.  Call if questions or concerns prior.

## 2019-03-18 NOTE — Assessment & Plan Note (Addendum)
History of iron deficiency anemia requiring iron infusions in the past, last infusion in January 2019.  She remains chronically anemic with hemoglobin in the 10 range with microcytic indices.  Last ferritin in February 2019 of 78.  Has not followed up with hematology since March 2019.  IDA may be in part due to history of menorrhagia; however, upper and lower GI tract have not been evaluated for contributing source.  She currently has alternating constipation/diarrhea but more on the constipated side, uses Goody powders regularly, fairly frequent GERD symptoms, upper abdominal with reported history of H. pylori 3 weeks ago diagnosed at [facility], and solid food dysphagia.  Denies melena, bright red blood per rectum, or unintentional weight loss.  No family history of colon cancer.   Due to two recent hospitalizations for acute on chronic CHF as well as significantly elevated blood pressure today, I am requesting patient to follow-up with cardiology prior to scheduling her procedures.  We will request cardiac clearance.  She has also been advised to proceed to the ED for acute evaluation of hypertensive urgency. Will have clinical staff hold a spot for TCS and EGD +/-dilation with propofol with Dr. Oneida Alar as our schedule is already booked out a few months.  Will place on the schedule pending cardiac clearance. The risks, benefits, and alternatives have been discussed in detail with patient. They have stated understanding and desire to proceed. Update CBC and iron panel. Stop omeprazole and start Protonix 40 mg twice daily. Avoid all NSAIDs including Goody powders.  Follow-up after procedures.  Call if questions or concerns prior.

## 2019-03-18 NOTE — Assessment & Plan Note (Signed)
GERD symptoms not ideally controlled.  Reflux about 3-4 days a week.  Taking omeprazole as needed.  Patient reports recent diagnosis of H. pylori at [facility] about 3 weeks ago s/p treatment with antibiotics and Prilosec x14 days.  Associated upper GI symptoms include upper abdominal pain, nausea without vomiting, and dysphagia to solid foods.  Denies bright red blood per rectum, melena, and unintentional weight loss.  Taking to [place] powders a few times a week.   Patient needs EGD +/-dilation with biopsies to evaluate for H. pylori eradication with Dr. Oneida Alar.  We will hold a spot for now and placed on the schedule pending cardiac evaluation for significantly elevated blood pressure and 2 recent hospitalizations for acute on chronic CHF.  She is also advised to proceed to the emergency department today for hypertensive urgency. Stop omeprazole and start Protonix 40 mg twice daily. Follow GERD diet.  Handout provided. Stop Goody powders.  Avoid all NSAIDs. Follow-up after procedures.  Call if questions or concerns prior.

## 2019-03-18 NOTE — Assessment & Plan Note (Signed)
Patient reports bowels fluctuate; however, constipation is predominant.  Producing very small hard bowel movements at this time.  Some associated lower abdominal pain at times. Denies bright red blood per rectum, melena, or unintentional weight loss.  She does have a history of IDA for which she will need a colonoscopy as described below.  Start Linzess 145 mcg daily on empty stomach.  Prescription sent to pharmacy.

## 2019-03-18 NOTE — Assessment & Plan Note (Signed)
Dysphagia to solid foods with sensation of food getting stuck around the xiphoid process.  This is in the setting of uncontrolled GERD.  Associated epigastric pain and nausea without vomiting.  Reports history of H. pylori diagnosed to 3 weeks ago at [facility] s/p treatment with antibiotics and Prilosec x14 days.  Currently taking Prilosec as needed.  Suspect patient has likely developed esophageal stricture, web, or ring.  Doubt malignancy.   Patient needs EGD +/-dilation with biopsies to evaluate for H. pylori eradication with Dr. Oneida Alar.  We will hold a spot for now and placed on the schedule pending cardiac evaluation for significantly elevated blood pressure and 2 recent hospitalizations for acute on chronic CHF.  She is also advised to proceed to the emergency department today for hypertensive urgency. Stop omeprazole and start Protonix 40 mg twice daily. Follow GERD diet.  Handout provided. Stop Goody powders.  Avoid all NSAIDs. Follow-up after procedure.  Call if questions or concerns prior.

## 2019-03-21 NOTE — Progress Notes (Deleted)
Cardiology Office Note    Date:  03/21/2019   ID:  Chelsey Santiago, DOB [birth year], MRN [id number]  PCP:  Chelsey Fire, MD  Cardiologist: Carlyle Dolly, MD EPS: None  No chief complaint on file.   History of Present Illness:  Chelsey Santiago is a 43 y.o. female with history of chronic combined systolic and diastolic CHF (EF [id number] by echo in 12/2015 with cath showing normal cors), nonischemic cardiomyopathy, HTN, and OSA on CPAP  Last seen in our office 11/2017 and was compensated on Entresto carvedilol spironolactone hydralazine and torsemide.  Carvedilol was increased to 50 mg twice daily.  She was having atypical chest pain.  Patient was in the ER 03/15/2019 with urgency elevated blood pressures low magnesium and potassium and medical noncompliance.  She was given labetalol as well as her regular medications and blood pressure came down nicely.  Potassium was 2.8 magnesium 1.5 BNP 505 creatinine normal at 0.98   Past Medical History:  Diagnosis Date  . Anemia    H&H of 10.6/33 and 07/2008 and 11.9/35 and 09/2010  . Anxiety   . CHF (congestive heart failure) ([place])    a. EF 40-45% by echo in 12/2015 with cath showing normal cors  . Depression with anxiety   . Diabetes mellitus without complication ([place])   . Enlarged heart   . Hypertension   . Hypertensive heart disease 2009   Pulmonary edema postpartum; mild to moderate mitral regurgitation when hospitalized for CHF in 2009; Echocardiogram in 12/2009-no MR and normal EF; normal CXR in 09/2010  . Migraine headache   . Miscarriage 03/19/2013  . Obesity 04/16/2009  . Osteoarthritis, knee 03/29/2011  . Preeclampsia   . Pregnant   . Pulmonary edema   . Sleep apnea   . Threatened abortion in early pregnancy 03/15/2013    Past Surgical History:  Procedure Laterality Date  . BREAST REDUCTION SURGERY  2002  . CARDIAC CATHETERIZATION N/A 12/22/2015   Procedure: Left Heart Cath and Coronary Angiography;  Surgeon: Peter M Martinique,  MD;  Location: [place] [facility];  Service: Cardiovascular;  Laterality: N/A;  . CESAREAN SECTION N/A 04/09/2014   Procedure: CESAREAN SECTION;  Surgeon: Mora Bellman, MD;  Location: [place] [facility];  Service: Obstetrics;  Laterality: N/A;  . CHOLECYSTECTOMY      Current Medications: No outpatient medications have been marked as taking for the 03/27/19 encounter (Appointment) with Imogene Burn, PA-C.     Allergies:   Diclofenac, Tramadol, Vicodin [hydrocodone-acetaminophen], and Hydrocodone bitartrate er   Social History   Socioeconomic History  . Marital status: Married    Spouse name: Not on file  . Number of children: Not on file  . Years of education: Not on file  . Highest education level: Not on file  Occupational History  . Occupation: unemployed    Fish farm manager: UNEMPLOYED  Social Needs  . Financial resource strain: Not on file  . Food insecurity    Worry: Not on file    Inability: Not on file  . Transportation needs    Medical: Not on file    Non-medical: Not on file  Tobacco Use  . Smoking status: Never Smoker  . Smokeless tobacco: Never Used  Substance and Sexual Activity  . Alcohol use: Yes    Comment: occ  . Drug use: No  . Sexual activity: Yes    Birth control/protection: Surgical    Comment: tubal  Lifestyle  . Physical activity    Days per week: Not on file  Minutes per session: Not on file  . Stress: Not on file  Relationships  . Social Herbalist on phone: Not on file    Gets together: Not on file    Attends religious service: Not on file    Active member of club or organization: Not on file    Attends meetings of clubs or organizations: Not on file    Relationship status: Not on file  Other Topics Concern  . Not on file  Social History Narrative   Lives in [place]   Engaged/boyfriend (father to youngest chid)   4 children: daughter (50 as of 2013), sons (15, 81, 21 as of 2013)   Religion: christian     Family History:  The patient's  ***family history includes ADD / ADHD in her son; Diabetes (age of onset: 23) in her mother; Heart attack in her brother; Heart disease in her father and mother; Heart disease (age of onset: 29) in her maternal grandmother; Hyperlipidemia in her paternal grandfather; Hypertension in her father, maternal uncle, and paternal grandfather.   ROS:   Please see the history of present illness.    ROS All other systems reviewed and are negative.   PHYSICAL EXAM:   VS:  LMP 03/09/2019   Physical Exam  GEN: Well nourished, well developed, in no acute distress  HEENT: normal  Neck: no JVD, carotid bruits, or masses Cardiac:RRR; no murmurs, rubs, or gallops  Respiratory:  clear to auscultation bilaterally, normal work of breathing GI: soft, nontender, nondistended, + BS Ext: without cyanosis, clubbing, or edema, Good distal pulses bilaterally MS: no deformity or atrophy  Skin: warm and dry, no rash Neuro:  Alert and Oriented x 3, Strength and sensation are intact Psych: euthymic mood, full affect  Wt Readings from Last 3 Encounters:  03/15/19 237 lb (107.5 kg)  03/14/19 237 lb 6.4 oz (107.7 kg)  02/09/19 239 lb 3.2 oz (108.5 kg)      Studies/Labs Reviewed:   EKG:  EKG is*** ordered today.  The ekg ordered today demonstrates ***  Recent Labs: 03/15/2019: ALT 18; B Natriuretic Peptide 505.0; BUN 19; Creatinine, Ser 0.98; Hemoglobin 10.1; Magnesium 1.5; Platelets 279; Potassium 2.8; Sodium 137   Lipid Panel    Component Value Date/Time   CHOL 211 (H) 06/03/2018 0615   TRIG 223 (H) 06/03/2018 0615   HDL 39 (L) 06/03/2018 0615   CHOLHDL 5.4 06/03/2018 0615   VLDL 45 (H) 06/03/2018 0615   LDLCALC 127 (H) 06/03/2018 0615    Additional studies/ records that were reviewed today include:   Echocardiogram: 07/11/2017 Study Conclusions   - Left ventricle: The cavity size was normal. Wall thickness was   increased in a pattern of moderate LVH. Systolic function was   moderately reduced.  The estimated ejection fraction was in the   range of 35% to 40%. Diffuse hypokinesis. Features are consistent   with a pseudonormal left ventricular filling pattern, with   concomitant abnormal relaxation and increased filling pressure   (grade 2 diastolic dysfunction). Doppler parameters are   consistent with high ventricular filling pressure. - Aortic valve: Mildly calcified annulus. Trileaflet; mildly   thickened leaflets. - Mitral valve: There was mild regurgitation. - Left atrium: The atrium was moderately dilated.   Cardiac MRI: 03/2016 IMPRESSION: 1.  Technically difficult study.   2.  Normal LV size with moderate LV hypertrophy, EF 35%.   3.  Mildly dilated RV with mildly decreased RV systolic function.   4.  No myocardial LGE, so no definitive evidence for prior myocardial infarction, infiltrative disease, or myocarditis.   Cardiac Catheterization: 12/2015  The left ventricular systolic function is normal.  LV end diastolic pressure is mildly elevated.  The left ventricular ejection fraction is 50-55% by visual estimate.   1. No significant CAD 2. Good LV function 3. Mildly elevated LV EDP.   Plan: medical therapy     ASSESSMENT:    1. Chronic combined systolic and diastolic CHF (congestive heart failure) ([place])   2. Nonischemic cardiomyopathy ([place])   3. Resistant hypertension   4. OSA on CPAP      PLAN:  In order of problems listed above:  Chronic combined systolic and diastolic CHF EF 40 to [id number] in 2017 cardiac catheterization was normal.  Nonischemic cardiomyopathy with history of normal coronary arteries  Resistant hypertension with recent hypertensive urgency 03/15/2019 due to noncompliance potassium and magnesium were also very low.  We will need to recheck those  OSA on CPAP    Medication Adjustments/Labs and Tests Ordered: Current medicines are reviewed at length with the patient today.  Concerns regarding medicines are outlined above.   Medication changes, Labs and Tests ordered today are listed in the Patient Instructions below. There are no Patient Instructions on file for this visit.   Signed, Ermalinda Barrios, PA-C  03/21/2019 2:45 PM    [facility] [address], [place], [place]  [address] Phone: [phone]; Fax: [phone]

## 2019-03-26 ENCOUNTER — Telehealth: Payer: Self-pay

## 2019-03-26 NOTE — Telephone Encounter (Signed)
Dr. Harl Bowie,   This mutual patient was seen in our office on 03/14/2019 by Aliene Altes, [place] would like to schedule the patient for a colonoscopy and EGD with dilation with Dr. Oneida Alar in the very near future.  We are asking for advise on cardiac clearance to do the procedures since the patient had a recent CHF exacerbation In August.  Please advise!  Thanks so much!  Everardo All, LPN

## 2019-03-26 NOTE — Telephone Encounter (Signed)
Looks like she has a clinic f/u tomorrow with our PA, will wait to see how she is doing at that time   J Bryant Saye MD

## 2019-03-27 ENCOUNTER — Ambulatory Visit: Payer: Medicaid Other | Admitting: Physician Assistant

## 2019-03-28 NOTE — Telephone Encounter (Signed)
I called pt to see why she did not keep appointment with cardiology yesterday and she said she had 2 other appointments and she just forgot.  She will call and reschedule the appointment and let me know when it is scheduled for.

## 2019-03-29 NOTE — Telephone Encounter (Signed)
Noted. I think we are holding a spot for procedures, so she will need to get in with cardiology ASAP so we can proceed with scheduling.

## 2019-03-29 NOTE — Telephone Encounter (Signed)
Sending FYI to Aliene Altes, [place].

## 2019-03-29 NOTE — Telephone Encounter (Signed)
I called and unable to leave a message for the pt. Mailing a letter to her.

## 2019-04-26 NOTE — Telephone Encounter (Signed)
error 

## 2019-04-26 NOTE — Telephone Encounter (Signed)
Noted. Could we continue holding for now since procedure isn't until February? I tried calling today.  No answer.  Unable to leave voicemail due to mailbox being full.  Maybe someone can reach out to her next week?  She needs a procedures and I would hate to cancel them.

## 2019-04-26 NOTE — Telephone Encounter (Signed)
We are holding date for patient for tcs/egd +/- DIL with [facility]. Patient NS'd appt with cardiology 11/10. DS mailed patient a letter stating she needs to call cardiology to schedule appt. No appt has been scheduled.

## 2019-04-27 NOTE — Telephone Encounter (Signed)
FYI to [employer], [place].

## 2019-04-27 NOTE — Telephone Encounter (Signed)
Called patient. Aware she needs to see cards prior to procedures being done. She will call cardiology

## 2019-04-27 NOTE — Telephone Encounter (Signed)
Noted  

## 2019-05-05 ENCOUNTER — Encounter (HOSPITAL_COMMUNITY): Payer: Self-pay | Admitting: Emergency Medicine

## 2019-05-05 ENCOUNTER — Other Ambulatory Visit: Payer: Self-pay

## 2019-05-05 ENCOUNTER — Emergency Department (HOSPITAL_COMMUNITY): Payer: Medicaid Other

## 2019-05-05 ENCOUNTER — Emergency Department (HOSPITAL_COMMUNITY)
Admission: EM | Admit: 2019-05-05 | Discharge: 2019-05-05 | Disposition: A | Discharge status: Home / Self Care | Payer: Medicaid Other | Attending: Emergency Medicine | Admitting: Emergency Medicine

## 2019-05-05 DIAGNOSIS — Z794 Long term (current) use of insulin: Secondary | ICD-10-CM | POA: Insufficient documentation

## 2019-05-05 DIAGNOSIS — Z7982 Long term (current) use of aspirin: Secondary | ICD-10-CM | POA: Insufficient documentation

## 2019-05-05 DIAGNOSIS — I5042 Chronic combined systolic (congestive) and diastolic (congestive) heart failure: Secondary | ICD-10-CM | POA: Diagnosis not present

## 2019-05-05 DIAGNOSIS — I110 Hypertensive heart disease with heart failure: Secondary | ICD-10-CM | POA: Diagnosis not present

## 2019-05-05 DIAGNOSIS — E119 Type 2 diabetes mellitus without complications: Secondary | ICD-10-CM | POA: Diagnosis not present

## 2019-05-05 DIAGNOSIS — R0602 Shortness of breath: Secondary | ICD-10-CM | POA: Diagnosis present

## 2019-05-05 DIAGNOSIS — I252 Old myocardial infarction: Secondary | ICD-10-CM | POA: Insufficient documentation

## 2019-05-05 DIAGNOSIS — Z79899 Other long term (current) drug therapy: Secondary | ICD-10-CM | POA: Insufficient documentation

## 2019-05-05 DIAGNOSIS — Z20828 Contact with and (suspected) exposure to other viral communicable diseases: Secondary | ICD-10-CM | POA: Diagnosis not present

## 2019-05-05 DIAGNOSIS — I5020 Unspecified systolic (congestive) heart failure: Secondary | ICD-10-CM

## 2019-05-05 LAB — DIFFERENTIAL
Abs Immature Granulocytes: 0.03 10*3/uL (ref 0.00–0.07)
Basophils Absolute: 0.1 10*3/uL (ref 0.0–0.1)
Basophils Relative: 1 %
Eosinophils Absolute: 0.1 10*3/uL (ref 0.0–0.5)
Eosinophils Relative: 1 %
Immature Granulocytes: 0 %
Lymphocytes Relative: 23 %
Lymphs Abs: 1.6 10*3/uL (ref 0.7–4.0)
Monocytes Absolute: 0.4 10*3/uL (ref 0.1–1.0)
Monocytes Relative: 6 %
Neutro Abs: 4.8 10*3/uL (ref 1.7–7.7)
Neutrophils Relative %: 69 %

## 2019-05-05 LAB — COMPREHENSIVE METABOLIC PANEL
ALT: 29 U/L (ref 0–44)
AST: 41 U/L (ref 15–41)
Albumin: 3.2 g/dL — ABNORMAL LOW (ref 3.5–5.0)
Alkaline Phosphatase: 87 U/L (ref 38–126)
Anion gap: 11 (ref 5–15)
BUN: 24 mg/dL — ABNORMAL HIGH (ref 6–20)
CO2: 27 mmol/L (ref 22–32)
Calcium: 8.9 mg/dL (ref 8.9–10.3)
Chloride: 99 mmol/L (ref 98–111)
Creatinine, Ser: 1.07 mg/dL — ABNORMAL HIGH (ref 0.44–1.00)
GFR calc Af Amer: >60 mL/min (ref >60)
GFR calc non Af Amer: >60 mL/min (ref >60)
Glucose, Bld: 343 mg/dL — ABNORMAL HIGH (ref 70–99)
Potassium: 3.5 mmol/L (ref 3.5–5.1)
Sodium: 137 mmol/L (ref 135–145)
Total Bilirubin: 0.7 mg/dL (ref 0.3–1.2)
Total Protein: 7.2 g/dL (ref 6.5–8.1)

## 2019-05-05 LAB — CBC
HCT: 33.4 % — ABNORMAL LOW (ref 36.0–46.0)
Hemoglobin: 9.9 g/dL — ABNORMAL LOW (ref 12.0–15.0)
MCH: 22.1 pg — ABNORMAL LOW (ref 26.0–34.0)
MCHC: 29.6 g/dL — ABNORMAL LOW (ref 30.0–36.0)
MCV: 74.7 fL — ABNORMAL LOW (ref 80.0–100.0)
Platelets: 191 10*3/uL (ref 150–400)
RBC: 4.47 MIL/uL (ref 3.87–5.11)
RDW: 16 % — ABNORMAL HIGH (ref 11.5–15.5)
WBC: 7.2 10*3/uL (ref 4.0–10.5)
nRBC: 0 % (ref 0.0–0.2)

## 2019-05-05 LAB — BRAIN NATRIURETIC PEPTIDE: B Natriuretic Peptide: 1083 pg/mL — ABNORMAL HIGH (ref 0.0–100.0)

## 2019-05-05 LAB — TROPONIN I (HIGH SENSITIVITY)
Troponin I (High Sensitivity): 29 ng/L — ABNORMAL HIGH (ref <18)
Troponin I (High Sensitivity): 29 ng/L — ABNORMAL HIGH (ref <18)

## 2019-05-05 LAB — POC SARS CORONAVIRUS 2 AG -  ED: SARS Coronavirus 2 Ag: NEGATIVE

## 2019-05-05 MED ORDER — FUROSEMIDE 10 MG/ML IJ SOLN
40.0000 mg | Freq: Once | INTRAMUSCULAR | Status: AC
  Administered 2019-05-05: 40 mg via INTRAVENOUS
  Filled 2019-05-05: qty 4

## 2019-05-05 NOTE — ED Triage Notes (Signed)
Patient c/o shortness of breath with cough x3-4 days. Patient states worse at night. Denies any fevers or chest pain. Per patient hx of CHF. Denies any swelling in lower extremities but states swelling in abd with nausea and dry heaving. Denies any diarrhea.

## 2019-05-05 NOTE — ED Provider Notes (Signed)
[facility] EMERGENCY DEPARTMENT Provider Note   CSN: [id number] Arrival date & time: 05/05/19  [id number]     History Chief Complaint  Patient presents with  . Shortness of Breath    Chelsey Santiago is a 43 y.o. female.  Patient complains of shortness of breath.  Patient has history of heart failure  The history is provided by the patient. No language interpreter was used.  Shortness of Breath Severity:  Mild Onset quality:  Sudden Timing:  Constant Progression:  Worsening Chronicity:  New Context: activity   Relieved by:  Nothing Associated symptoms: no abdominal pain, no chest pain, no cough, no headaches and no rash        Past Medical History:  Diagnosis Date  . Anemia    H&H of 10.6/33 and 07/2008 and 11.9/35 and 09/2010  . Anxiety   . CHF (congestive heart failure) ([place])    a. EF 40-45% by echo in 12/2015 with cath showing normal cors  . Depression with anxiety   . Diabetes mellitus without complication ([place])   . Enlarged heart   . Hypertension   . Hypertensive heart disease 2009   Pulmonary edema postpartum; mild to moderate mitral regurgitation when hospitalized for CHF in 2009; Echocardiogram in 12/2009-no MR and normal EF; normal CXR in 09/2010  . Migraine headache   . Miscarriage 03/19/2013  . Obesity 04/16/2009  . Osteoarthritis, knee 03/29/2011  . Preeclampsia   . Pregnant   . Pulmonary edema   . Sleep apnea   . Threatened abortion in early pregnancy 03/15/2013    Patient Active Problem List   Diagnosis Date Noted  . Abdominal pain, epigastric 03/14/2019  . Dysphagia 03/14/2019  . Gastroesophageal reflux disease 03/14/2019  . Class 2 obesity   . Acute exacerbation of CHF (congestive heart failure) ([place]) 10/26/2018  . Chronic combined systolic and diastolic CHF (congestive heart failure) ([place]) 06/02/2018  . Uncontrolled type 2 diabetes mellitus with hyperglycemia ([place]) 06/02/2018  . Influenza B 05/13/2018  . Hypomagnesemia 05/12/2018  . Headache  05/12/2018  . Upper respiratory tract infection   . HCAP (healthcare-associated pneumonia)   . Constipation 10/17/2017  . Bad headache   . CHF exacerbation ([place]) 09/29/2017  . Iron deficiency anemia 05/16/2017  . Vitamin D deficiency 11/25/2016  . Iron deficiency 11/25/2016  . History of acute myocardial infarction 10/05/2016  . CHF (congestive heart failure) ([place]) 05/06/2016  . Diabetes mellitus with complication ([place]) [id number]  . Leukocytosis 05/06/2016  . Neuropathy 05/06/2016  . Depression 04/16/2016  . Chronic tension-type headache, not intractable 04/16/2016  . AKI (acute kidney injury) ([place])   . Hyperkalemia   . Nonischemic cardiomyopathy ([place])   . Acute on chronic combined systolic and diastolic ACC/AHA stage C congestive heart failure ([place]) 04/03/2016  . Acute on chronic combined systolic and diastolic CHF, NYHA class 4 ([place]) 04/03/2016  . Hypertensive emergency 04/03/2016  . Cardiomyopathy due to hypertension ([place]) 12/22/2015  . Normal coronary arteries 12/22/2015  . Troponin level elevated 12/22/2015  . NSTEMI (non-ST elevated myocardial infarction) ([place]) 12/20/2015  . Dental infection 10/10/2015  . Chest pain 09/05/2015  . Systolic CHF, chronic ([place]) 09/05/2015  . LLQ pain   . Type 2 diabetes mellitus without complication ([place]) [id number]  . Essential hypertension   . 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 Hypertensive urgency 04/22/2014  . Acute CHF ([place]) 04/22/2014  . S/P cesarean section 04/11/2014  . Acute pulmonary edema ([place]) 04/11/2014  . Postoperative anemia 04/11/2014  . Elevated serum creatinine 04/11/2014  .  Preeclampsia, severe 04/09/2014  . Pre-eclampsia superimposed on chronic hypertension, antepartum 04/08/2014  . Dyspnea 04/08/2014  . Polyhydramnios in third trimester, antepartum 03/14/2014  . Abnormal maternal glucose tolerance, antepartum 03/11/2014  . High-risk pregnancy 03/11/2014  . Pre-existing essential hypertension complicating  pregnancy 0000000  . Impaired glucose tolerance during pregnancy, antepartum 11/27/2013  . Leiomyoma of uterus 11/22/2013  . History of gestational diabetes in prior pregnancy, currently pregnant in first trimester 11/22/2013  . Hx of preeclampsia, prior pregnancy, currently pregnant 11/22/2013  . Short interval between pregnancies affecting pregnancy, antepartum 11/22/2013  . Supervision of high-risk pregnancy of elderly primigravida (>= 74 years old at delivery), third trimester 11/22/2013  . Miscarriage 03/19/2013  . Major depressive disorder, single episode, unspecified 09/27/2011  . Hypertension   . Hypertensive cardiovascular disease   . Microcytic anemia   . Osteoarthrosis involving lower leg 03/29/2011  . Hypokalemia 12/12/2009  . OSA on CPAP 12/09/2009  . Morbid obesity ([place]) 04/16/2009    Past Surgical History:  Procedure Laterality Date  . BREAST REDUCTION SURGERY  2002  . CARDIAC CATHETERIZATION N/A 12/22/2015   Procedure: Left Heart Cath and Coronary Angiography;  Surgeon: Peter M Martinique, MD;  Location: [facility];  Service: Cardiovascular;  Laterality: N/A;  . CESAREAN SECTION N/A 04/09/2014   Procedure: CESAREAN SECTION;  Surgeon: Mora Bellman, MD;  Location: [facility];  Service: Obstetrics;  Laterality: N/A;  . CHOLECYSTECTOMY       OB History    Gravida  11   Para  6   Term  5   Preterm  1   AB  5   Living  6     SAB  3   TAB  2   Ectopic      Multiple  0   Live Births  6           Family History  Problem Relation Age of Onset  . Diabetes Mother 16  . Heart disease Mother   . Hyperlipidemia Paternal Grandfather   . Hypertension Paternal Grandfather   . Heart disease Father   . Hypertension Father   . Heart disease Maternal Grandmother 60  . ADD / ADHD Son   . Hypertension Maternal Uncle   . Heart attack Brother   . Sudden death Neg Hx   . Colon cancer Neg Hx   . Celiac disease Neg Hx   . Inflammatory bowel disease Neg Hx      Social History   Tobacco Use  . Smoking status: Never Smoker  . Smokeless tobacco: Never Used  Substance Use Topics  . Alcohol use: Yes    Comment: occ  . Drug use: No    Home Medications Prior to Admission medications   Medication Sig Start Date End Date Taking? Authorizing Provider  albuterol (PROVENTIL HFA;VENTOLIN HFA) 108 (90 Base) MCG/ACT inhaler Inhale 1 puff into the lungs every 6 (six) hours as needed for wheezing or shortness of breath.    [provider]  ARIPiprazole (ABILIFY) 10 MG tablet Take 1 tablet (10 mg total) by mouth daily. 10/30/18   Barton Dubois, MD  aspirin EC 81 MG tablet Take 81 mg by mouth daily.    [provider]  atorvastatin (LIPITOR) 20 MG tablet Take 1 tablet (20 mg total) by mouth daily at 6 PM. 06/03/18   Tat, Shanon Brow, MD  budesonide-formoterol [facility]) 80-4.5 MCG/ACT inhaler Inhale 2 puffs into the lungs 2 (two) times daily. 05/04/18   Heath Lark D, DO  carvedilol (COREG) 25 MG tablet Take 2 tablets (50 mg total) by mouth 2 (two) times daily with a meal. 0000000   Delora Fuel, MD  clonazePAM (KLONOPIN) 0.5 MG tablet Take 0.5 mg by mouth 2 (two) times daily. 12/19/18   [provider]  gabapentin (NEURONTIN) 600 MG tablet Take 600 mg by mouth 3 (three) times daily.     [provider]  hydrALAZINE (APRESOLINE) 100 MG tablet Take 1 tablet (100 mg total) by mouth 3 (three) times daily. 06/03/18   Orson Eva, MD  insulin glargine (LANTUS) 100 UNIT/ML injection Inject 30 Units at bedtime into the skin.     [provider]  insulin lispro (HUMALOG) 100 UNIT/ML injection Inject 15 Units into the skin 3 (three) times daily before meals.     [provider]  Ipratropium-Albuterol (COMBIVENT RESPIMAT) 20-100 MCG/ACT AERS respimat Inhale 1 puff into the lungs every 6 (six) hours as needed for wheezing or shortness of breath. 05/04/18   Manuella Ghazi, Pratik D, DO  linaclotide Rolan Lipa) 145 MCG CAPS capsule Take 1  capsule (145 mcg total) by mouth daily before breakfast. 03/14/19   Erenest Rasher, PA-C  metFORMIN (GLUCOPHAGE) 500 MG tablet Take 500 mg 2 (two) times daily with a meal by mouth.    [provider]  methocarbamol (ROBAXIN) 500 MG tablet Take 1 tablet (500 mg total) by mouth every 8 (eight) hours as needed for muscle spasms. 01/02/19   Davonna Belling, MD  nitroGLYCERIN (NITROSTAT) 0.4 MG SL tablet Place 1 tablet (0.4 mg total) under the tongue every 5 (five) minutes as needed for chest pain. 09/05/15   Kathie Dike, MD  ondansetron (ZOFRAN) 4 MG tablet Take 4 mg by mouth daily as needed for nausea or vomiting.    [provider]  pantoprazole (PROTONIX) 40 MG tablet Take 1 tablet (40 mg total) by mouth 2 (two) times daily before a meal. 03/14/19   Jodi Mourning, Tivis Ringer, PA-C  potassium chloride SA (KLOR-CON) 20 MEQ tablet Take 1 tablet (20 mEq total) by mouth 2 (two) times daily. 0000000   Delora Fuel, MD  promethazine (PHENERGAN) 25 MG tablet Take 1 tablet (25 mg total) by mouth every 6 (six) hours as needed for nausea or vomiting. 02/06/19   Idol, Almyra Free, PA-C  pyridostigmine (MESTINON) 60 MG tablet Take 60 mg by mouth every 8 (eight) hours.    [provider]  sacubitril-valsartan (ENTRESTO) 97-103 MG Take 1 tablet by mouth 2 (two) times daily. 03/16/19   Arnoldo Lenis, MD  torsemide (DEMADEX) 20 MG tablet Take 2 tablets (40 mg total) by mouth 2 (two) times daily. Please take 40 mg oral every morning and 20 mg every evening. Patient taking differently: Take 20 mg by mouth 4 (four) times daily.  10/29/18 03/14/19  Barton Dubois, MD  traZODone (DESYREL) 100 MG tablet Take 2 tablets (200 mg total) by mouth at bedtime. 10/29/18   Barton Dubois, MD    Allergies    Diclofenac, Tramadol, Vicodin [hydrocodone-acetaminophen], and Hydrocodone bitartrate er  Review of Systems   Review of Systems  Constitutional: Negative for appetite change and fatigue.  HENT:  Negative for congestion, ear discharge and sinus pressure.   Eyes: Negative for discharge.  Respiratory: Positive for shortness of breath. Negative for cough.   Cardiovascular: Negative for chest pain.  Gastrointestinal: Negative for abdominal pain and diarrhea.  Genitourinary: Negative for frequency and hematuria.  Musculoskeletal: Negative for back pain.  Skin: Negative for rash.  Neurological: Negative  for seizures and headaches.  Psychiatric/Behavioral: Negative for hallucinations.    Physical Exam Updated Vital Signs BP (!) 170/99   Pulse 86   Temp 98.5 F (36.9 C) (Oral)   Resp 17   Ht 5\' 6"  (1.676 m)   Wt 102.1 kg   LMP 04/28/2019   SpO2 99%   BMI 36.32 kg/m   Physical Exam Vitals and nursing note reviewed.  Constitutional:      Appearance: She is well-developed.  HENT:     Head: Normocephalic.     Nose: Nose normal.  Eyes:     General: No scleral icterus.    Conjunctiva/sclera: Conjunctivae normal.  Neck:     Thyroid: No thyromegaly.  Cardiovascular:     Rate and Rhythm: Normal rate and regular rhythm.     Heart sounds: No murmur. No friction rub. No gallop.   Pulmonary:     Breath sounds: No stridor. No wheezing or rales.  Chest:     Chest wall: No tenderness.  Abdominal:     General: There is no distension.     Tenderness: There is no abdominal tenderness. There is no rebound.  Musculoskeletal:        General: Normal range of motion.     Cervical back: Neck supple.  Lymphadenopathy:     Cervical: No cervical adenopathy.  Skin:    Findings: No erythema or rash.  Neurological:     Mental Status: She is oriented to person, place, and time.     Motor: No abnormal muscle tone.     Coordination: Coordination normal.  Psychiatric:        Behavior: Behavior normal.     ED Results / Procedures / Treatments   Labs (all labs ordered are listed, but only abnormal results are displayed) Labs Reviewed  CBC - Abnormal; Notable for the following  components:      Result Value   Hemoglobin 9.9 (*)    HCT 33.4 (*)    MCV 74.7 (*)    MCH 22.1 (*)    MCHC 29.6 (*)    RDW 16.0 (*)    All other components within normal limits  COMPREHENSIVE METABOLIC PANEL - Abnormal; Notable for the following components:   Glucose, Bld 343 (*)    BUN 24 (*)    Creatinine, Ser 1.07 (*)    Albumin 3.2 (*)    All other components within normal limits  BRAIN NATRIURETIC PEPTIDE - Abnormal; Notable for the following components:   B Natriuretic Peptide 1,083.0 (*)    All other components within normal limits  TROPONIN I (HIGH SENSITIVITY) - Abnormal; Notable for the following components:   Troponin I (High Sensitivity) 29 (*)    All other components within normal limits  TROPONIN I (HIGH SENSITIVITY) - Abnormal; Notable for the following components:   Troponin I (High Sensitivity) 29 (*)    All other components within normal limits  DIFFERENTIAL  POC SARS CORONAVIRUS 2 AG -  ED    EKG None  Radiology DG Chest Port 1 View  Result Date: 05/05/2019 CLINICAL DATA:  Shortness of breath and cough for the past 3-4 days. EXAM: PORTABLE CHEST 1 VIEW COMPARISON:  Chest x-ray dated March 15, 2019. FINDINGS: Unchanged cardiomegaly. New pulmonary vascular congestion and mild interstitial thickening at the lung bases. No focal consolidation, pleural effusion, or pneumothorax. No acute osseous abnormality. IMPRESSION: 1. Chronic cardiomegaly with new mild interstitial pulmonary edema. Electronically Signed   By: Orville Govern.D.  On: 05/05/2019 10:42    Procedures Procedures (including critical care time)  Medications Ordered in ED Medications  furosemide (LASIX) injection 40 mg (40 mg Intravenous Given 05/05/19 1247)    ED Course  I have reviewed the triage vital signs and the nursing notes.  Pertinent labs & imaging results that were available during my care of the patient were reviewed by me and considered in my medical decision making (see  chart for details).    MDM Rules/Calculators/A&P      Patient has mild worsening of congestive heart failure she will increase her diuretic so she is taking 3 pills at bedtime and follow-up with her PCP                 Final Clinical Impression(s) / ED Diagnoses Final diagnoses:  Systolic congestive heart failure, unspecified HF chronicity ([place])    Rx / DC Orders ED Discharge Orders    None       Milton Ferguson, MD 05/05/19 1317

## 2019-05-05 NOTE — Discharge Instructions (Signed)
Increase your fluid pill so you are taking 3 pills in the morning and 2 pills in the evening.  Do that for about 5 days and follow-up with your PCP next week

## 2019-05-06 LAB — SARS CORONAVIRUS 2 (TAT 6-24 HRS): SARS Coronavirus 2: NEGATIVE

## 2019-05-09 ENCOUNTER — Other Ambulatory Visit: Payer: Self-pay | Admitting: *Deleted

## 2019-05-09 MED ORDER — CARVEDILOL 25 MG PO TABS: 50.0000 mg | ORAL_TABLET | Freq: Two times a day (BID) | ORAL | 0 refills | Status: DC

## 2019-05-14 NOTE — Progress Notes (Deleted)
Cardiology Office Note    Date:  05/14/2019   ID:  Veyonce Mccoun, DOB [birth year], MRN [id number]  PCP:  Rosita Fire, MD  Cardiologist: Carlyle Dolly, MD EPS: None  No chief complaint on file.   History of Present Illness:  Chelsey Santiago is a 43 y.o. female with chronic systolic and diastolic [id number] echo [id number], DM, HTN, OSA, depression, no CAD on cath 2017  Patient in ED 03/15/19 for HTN, hypokalemia and hypomagnesemia due to noncompliance.  Past Medical History:  Diagnosis Date  . Anemia    H&H of 10.6/33 and 07/2008 and 11.9/35 and 09/2010  . Anxiety   . CHF (congestive heart failure) ([place])    a. EF 40-45% by echo in 12/2015 with cath showing normal cors  . Depression with anxiety   . Diabetes mellitus without complication ([place])   . Enlarged heart   . Hypertension   . Hypertensive heart disease 2009   Pulmonary edema postpartum; mild to moderate mitral regurgitation when hospitalized for CHF in 2009; Echocardiogram in 12/2009-no MR and normal EF; normal CXR in 09/2010  . Migraine headache   . Miscarriage 03/19/2013  . Obesity 04/16/2009  . Osteoarthritis, knee 03/29/2011  . Preeclampsia   . Pregnant   . Pulmonary edema   . Sleep apnea   . Threatened abortion in early pregnancy 03/15/2013    Past Surgical History:  Procedure Laterality Date  . BREAST REDUCTION SURGERY  2002  . CARDIAC CATHETERIZATION N/A 12/22/2015   Procedure: Left Heart Cath and Coronary Angiography;  Surgeon: Peter M Martinique, MD;  Location: [facility];  Service: Cardiovascular;  Laterality: N/A;  . CESAREAN SECTION N/A 04/09/2014   Procedure: CESAREAN SECTION;  Surgeon: Mora Bellman, MD;  Location: [facility];  Service: Obstetrics;  Laterality: N/A;  . CHOLECYSTECTOMY      Current Medications: No outpatient medications have been marked as taking for the 05/21/19 encounter (Appointment) with Imogene Burn, PA-C.     Allergies:   Diclofenac, Tramadol, Vicodin  [hydrocodone-acetaminophen], and Hydrocodone bitartrate er   Social History   Socioeconomic History  . Marital status: Married    Spouse name: Not on file  . Number of children: Not on file  . Years of education: Not on file  . Highest education level: Not on file  Occupational History  . Occupation: unemployed    Fish farm manager: UNEMPLOYED  Tobacco Use  . Smoking status: Never Smoker  . Smokeless tobacco: Never Used  Substance and Sexual Activity  . Alcohol use: Yes    Comment: occ  . Drug use: No  . Sexual activity: Yes    Birth control/protection: Surgical    Comment: tubal  Other Topics Concern  . Not on file  Social History Narrative   Lives in [place]   Engaged/boyfriend (father to youngest chid)   4 children: daughter (37 as of 2013), sons (15, 75, 66 as of 2013)   Religion: christian   Social Determinants of Radio broadcast assistant Strain:   . Difficulty of Paying Living Expenses: Not on file  Food Insecurity:   . Worried About Charity fundraiser in the Last Year: Not on file  . Ran Out of Food in the Last Year: Not on file  Transportation Needs:   . Lack of Transportation (Medical): Not on file  . Lack of Transportation (Non-Medical): Not on file  Physical Activity:   . Days of Exercise per Week: Not on file  . Minutes of Exercise per  Session: Not on file  Stress:   . Feeling of Stress : Not on file  Social Connections:   . Frequency of Communication with Friends and Family: Not on file  . Frequency of Social Gatherings with Friends and Family: Not on file  . Attends Religious Services: Not on file  . Active Member of Clubs or Organizations: Not on file  . Attends Archivist Meetings: Not on file  . Marital Status: Not on file     Family History:  The patient's ***family history includes ADD / ADHD in her son; Diabetes (age of onset: 59) in her mother; Heart attack in her brother; Heart disease in her father and mother; Heart disease (age of onset:  71) in her maternal grandmother; Hyperlipidemia in her paternal grandfather; Hypertension in her father, maternal uncle, and paternal grandfather.   ROS:   Please see the history of present illness.    ROS All other systems reviewed and are negative.   PHYSICAL EXAM:   VS:  LMP 04/28/2019   Physical Exam  GEN: Well nourished, well developed, in no acute distress  HEENT: normal  Neck: no JVD, carotid bruits, or masses Cardiac:RRR; no murmurs, rubs, or gallops  Respiratory:  clear to auscultation bilaterally, normal work of breathing GI: soft, nontender, nondistended, + BS Ext: without cyanosis, clubbing, or edema, Good distal pulses bilaterally MS: no deformity or atrophy  Skin: warm and dry, no rash Neuro:  Alert and Oriented x 3, Strength and sensation are intact Psych: euthymic mood, full affect  Wt Readings from Last 3 Encounters:  05/05/19 225 lb (102.1 kg)  03/15/19 237 lb (107.5 kg)  03/14/19 237 lb 6.4 oz (107.7 kg)      Studies/Labs Reviewed:   EKG:  EKG is*** ordered today.  The ekg ordered today demonstrates ***  Recent Labs: 03/15/2019: Magnesium 1.5 05/05/2019: ALT 29; B Natriuretic Peptide 1,083.0; BUN 24; Creatinine, Ser 1.07; Hemoglobin 9.9; Platelets 191; Potassium 3.5; Sodium 137   Lipid Panel    Component Value Date/Time   CHOL 211 (H) 06/03/2018 0615   TRIG 223 (H) 06/03/2018 0615   HDL 39 (L) 06/03/2018 0615   CHOLHDL 5.4 06/03/2018 0615   VLDL 45 (H) 06/03/2018 0615   LDLCALC 127 (H) 06/03/2018 0615    Additional studies/ records that were reviewed today include:  Echo 10/2018 IMPRESSIONS      1. The left ventricle has a visually estimated ejection fraction of 45%. The cavity size was normal. There is severe concentric left ventricular hypertrophy. Left ventricular diastolic Doppler parameters are consistent with pseudonormalization. Elevated  left ventricular end-diastolic pressure Left ventricular diffuse hypokinesis.  2. The right  ventricle has normal systolic function. The cavity was normal. There is no increase in right ventricular wall thickness.  3. Left atrial size was severely dilated.  4. The mitral valve is grossly normal.  5. The tricuspid valve is grossly normal.  6. The aortic valve is tricuspid. Mild thickening of the aortic valve.  7. Moderate aortic annular calcification.  8. The aortic root is normal in size and structure. Cardiac Catheterization: 12/2015  The left ventricular systolic function is normal.  LV end diastolic pressure is mildly elevated.  The left ventricular ejection fraction is 50-55% by visual estimate.   1. No significant CAD 2. Good LV function 3. Mildly elevated LV EDP.   Plan: medical therapy   Echocardiogram: 04/30/2018 Study Conclusions   - Left ventricle: The cavity size was normal. Wall thickness was  increased in a pattern of moderate LVH. There was moderate   concentric hypertrophy. Systolic function was mildly reduced. The   estimated ejection fraction was in the range of 45% to 50%.   Diffuse hypokinesis. Features are consistent with a pseudonormal   left ventricular filling pattern, with concomitant abnormal   relaxation and increased filling pressure (grade 2 diastolic   dysfunction). - Left atrium: The atrium was moderately dilated. - Pulmonary arteries: Systolic pressure was mildly increased. PA   peak pressure: 44 mm Hg (S).   Impressions:   - Improved EF when compared to prior.       ASSESSMENT:    No diagnosis found.   PLAN:  In order of problems listed above:  Chronic combined systolic and diastolic CHF  History of chest pain-normal cardiac cath 2017  Nonischemic Cardiomyopathy  HTN  IDDM    Medication Adjustments/Labs and Tests Ordered: Current medicines are reviewed at length with the patient today.  Concerns regarding medicines are outlined above.  Medication changes, Labs and Tests ordered today are listed in the Patient  Instructions below. There are no Patient Instructions on file for this visit.   Sumner Boast, PA-C  05/14/2019 9:27 AM    [facility] [address], [place], [place]  [address] Phone: [phone]; Fax: [phone]

## 2019-05-17 ENCOUNTER — Telehealth: Payer: Self-pay | Admitting: Cardiology

## 2019-05-17 MED ORDER — TORSEMIDE 20 MG PO TABS: ORAL_TABLET | ORAL | 0 refills | Status: DC

## 2019-05-17 NOTE — Telephone Encounter (Signed)
Pt is taking 60 mg of torsemide in the morning and 40 mg in the afternoon. Sent in 30 day supply to [facility].

## 2019-05-17 NOTE — Telephone Encounter (Signed)
*  STAT* If patient is at the pharmacy, call can be transferred to refill team.   1. Which medications need to be refilled? FLUID PILL?  2. Which pharmacy/location (including street and city if local pharmacy) is medication to be sent to? [place]  3. Do they need a 30 day or 90 day supply?

## 2019-05-17 NOTE — Telephone Encounter (Signed)
I need to speak with patient. Her instructions for torsemide have three different orders. Her VM is full.She has an apt on Monday 05/21/2019 with Estella Husk, PA-C

## 2019-05-21 ENCOUNTER — Ambulatory Visit: Payer: Medicaid Other | Admitting: Physician Assistant

## 2019-05-31 ENCOUNTER — Encounter: Payer: Self-pay | Admitting: Physician Assistant

## 2019-05-31 NOTE — Progress Notes (Addendum)
Cardiology Office Note    Date:  06/04/2019   ID:  Chelsey Santiago, DOB 01-06-1976, MRN [id number]  PCP:  Rosita Fire, MD  Cardiologist:  Carlyle Dolly, MD  Electrophysiologist:  None   Chief Complaint: overdue f/u CHF   History of Present Illness:   Chelsey Santiago is a 44 y.o. female with history of chronic combined systolic and diastolic CHF, nonischemic cardiomyopathy (normal coronaries 12/2015) felt due to hypertensive heart disease, difficult-to-control HTN, mixed compliance, IDDM, OSA, anemia, obesity, prolonged QT by EKGs, anxiety, migraines, pre-eclampsia who presents for follow-up.  She has been followed by cardiology for over a decade, including by Dr. Lattie Haw in the past. She has 6 children. Her oldest is 70, youngest is 81. She first developed CHF after birth of a child in 2009. Her EF was 50-55% at that time. She also has a history of difficult to control hypertension, with prior note outlining some difficulty in part due to mixed compliance/motivation. In 2015, she was hospitalized again following the birth of another child for heart failure, uncontrolled hypertension and EF 25%. She was managed medically. EF improved to 50-55% in 08/2015. She was seen again in 12/2015 with chest pain and minimally elevated troponin. EF was 40-45% by echo and 50-55% by cath which also revealed normal coronary arteries. She was seen in consultation by the CHF team in 02/2016. Cardiac MRI (04/05/16) showed moderate LVH, diffuse hypokinesis EF 35%, mildly dilated RV with mildly decreased systolic function, no LGE. Per notes, had prior h/a with nitrate. She has not been seen by cardiology since last inpatient consultation 05/2018. She was admitted 10/2018 to the hospitalized service with recurrent heart failure. This last echocardiogram 10/28/18 showed EF 45%, severe LVH, pseudonormalization, diffuse LV hypoknesis, normal RV, severe LAE. She was seen in the ED in December with mild exacerbation of CHF  requiring increase of torsemide from 2 tablets BID to 3 QAM/2 QPM. She feels this has resolved her fluid retention back to baseline. LEE resolved. Breathing at baseline. No CP. Outside labs personally reviewed include: 05/05/19 negative Covid x 2, hstroponin 29-29, BNP 1083, K 3.5, Cr 1.07, glucose 343, albumin 3.2, otherwise LFTs ok, Hgb 9.9 (microcytic/similar to prior). Most recent UA from 02/2019 was negative for proteinuria.  She returns today for overdue follow-up. Per review of medications today, from BP standpoint, she affirms she is still taking/compliant with carvedilol 50mg  BID (increased in 2019 by Dr. Harl Bowie given weight/BP), hydralazine 100mg  TID, Delene Loll 97/103mg  BID, and torsemide as above. She has been periodically on/off spironolactone for unclear reasons. It was discontinued from 10/2018 med list. I do not see a history of hyperkalemia and she does not recall why she isn't on it. She overall feels well from cardiac standpoint except complains of generalized fatigue and frequent headaches. She also continues to see BPs in the 0000000 systolic range at home. She was supposed to have her OSA managed by her PCP but he retired and she does not currently have anyone managing this. She is worried about continuing the higher dose of torsemide long term. She is also pending colonoscopy mid February and will need clearance for this.   Past Medical History:  Diagnosis Date  . Anemia    H&H of 10.6/33 and 07/2008 and 11.9/35 and 09/2010  . Anxiety   . Chronic combined systolic and diastolic CHF (congestive heart failure) ([place])   . Depression with anxiety   . Diabetes mellitus without complication ([place])   . Hypertension   . Hypertensive  heart disease 2009   Pulmonary edema postpartum; mild to moderate mitral regurgitation when hospitalized for CHF in 2009; Echocardiogram in 12/2009-no MR and normal EF; normal CXR in 09/2010  . Migraine headache   . Miscarriage 03/19/2013  . Obesity 04/16/2009  .  Osteoarthritis, knee 03/29/2011  . Preeclampsia   . Pulmonary edema   . Sleep apnea   . Threatened abortion in early pregnancy 03/15/2013    Past Surgical History:  Procedure Laterality Date  . BREAST REDUCTION SURGERY  2002  . CARDIAC CATHETERIZATION N/A 12/22/2015   Procedure: Left Heart Cath and Coronary Angiography;  Surgeon: Peter M Martinique, MD;  Location: [facility];  Service: Cardiovascular;  Laterality: N/A;  . CESAREAN SECTION N/A 04/09/2014   Procedure: CESAREAN SECTION;  Surgeon: Mora Bellman, MD;  Location: [facility];  Service: Obstetrics;  Laterality: N/A;  . CHOLECYSTECTOMY      Current Medications: Current Meds  Medication Sig  . albuterol (PROVENTIL HFA;VENTOLIN HFA) 108 (90 Base) MCG/ACT inhaler Inhale 1 puff into the lungs every 6 (six) hours as needed for wheezing or shortness of breath.  . ARIPiprazole (ABILIFY) 10 MG tablet Take 1 tablet (10 mg total) by mouth daily.  Marland Kitchen aspirin EC 81 MG tablet Take 81 mg by mouth daily.  Marland Kitchen atorvastatin (LIPITOR) 20 MG tablet Take 1 tablet (20 mg total) by mouth daily at 6 PM.  . budesonide-formoterol (SYMBICORT) 80-4.5 MCG/ACT inhaler Inhale 2 puffs into the lungs 2 (two) times daily.  . carvedilol (COREG) 25 MG tablet Take 2 tablets (50 mg total) by mouth 2 (two) times daily with a meal.  . clonazePAM (KLONOPIN) 0.5 MG tablet Take 0.5 mg by mouth 2 (two) times daily.  Marland Kitchen gabapentin (NEURONTIN) 600 MG tablet Take 600 mg by mouth 3 (three) times daily.   . hydrALAZINE (APRESOLINE) 100 MG tablet Take 1 tablet (100 mg total) by mouth 3 (three) times daily.  . insulin glargine (LANTUS) 100 UNIT/ML injection Inject 30 Units at bedtime into the skin.   Marland Kitchen insulin lispro (HUMALOG) 100 UNIT/ML injection Inject 15 Units into the skin 3 (three) times daily before meals.   . Ipratropium-Albuterol (COMBIVENT RESPIMAT) 20-100 MCG/ACT AERS respimat Inhale 1 puff into the lungs every 6 (six) hours as needed for wheezing or shortness of breath.    . linaclotide (LINZESS) 145 MCG CAPS capsule Take 1 capsule (145 mcg total) by mouth daily before breakfast.  . metFORMIN (GLUCOPHAGE) 500 MG tablet Take 500 mg 2 (two) times daily with a meal by mouth.  . methocarbamol (ROBAXIN) 500 MG tablet Take 1 tablet (500 mg total) by mouth every 8 (eight) hours as needed for muscle spasms.  . nitroGLYCERIN (NITROSTAT) 0.4 MG SL tablet Place 1 tablet (0.4 mg total) under the tongue every 5 (five) minutes as needed for chest pain.  Marland Kitchen ondansetron (ZOFRAN) 4 MG tablet Take 4 mg by mouth daily as needed for nausea or vomiting.  . pantoprazole (PROTONIX) 40 MG tablet Take 1 tablet (40 mg total) by mouth 2 (two) times daily before a meal.  . potassium chloride SA (KLOR-CON) 20 MEQ tablet Take 1 tablet (20 mEq total) by mouth 2 (two) times daily.  . promethazine (PHENERGAN) 25 MG tablet Take 1 tablet (25 mg total) by mouth every 6 (six) hours as needed for nausea or vomiting.  . pyridostigmine (MESTINON) 60 MG tablet Take 60 mg by mouth every 8 (eight) hours.  . sacubitril-valsartan (ENTRESTO) 97-103 MG Take 1 tablet by mouth 2 (  two) times daily.  Marland Kitchen torsemide (DEMADEX) 20 MG tablet Take 60  Mg in the morning and 40 mg in the evening.  . traZODone (DESYREL) 100 MG tablet Take 2 tablets (200 mg total) by mouth at bedtime.      Allergies:   Diclofenac, Tramadol, Vicodin [hydrocodone-acetaminophen], and Hydrocodone bitartrate er   Social History   Socioeconomic History  . Marital status: Married    Spouse name: Not on file  . Number of children: Not on file  . Years of education: Not on file  . Highest education level: Not on file  Occupational History  . Occupation: unemployed    Fish farm manager: UNEMPLOYED  Tobacco Use  . Smoking status: Never Smoker  . Smokeless tobacco: Never Used  Substance and Sexual Activity  . Alcohol use: Yes    Comment: occ  . Drug use: No  . Sexual activity: Yes    Birth control/protection: Surgical    Comment: tubal  Other  Topics Concern  . Not on file  Social History Narrative   Lives in [place]   Engaged/boyfriend (father to youngest chid)   4 children: daughter (38 as of 2013), sons (15, 7, 55 as of 2013)   Religion: christian   Social Determinants of Radio broadcast assistant Strain:   . Difficulty of Paying Living Expenses: Not on file  Food Insecurity:   . Worried About Charity fundraiser in the Last Year: Not on file  . Ran Out of Food in the Last Year: Not on file  Transportation Needs:   . Lack of Transportation (Medical): Not on file  . Lack of Transportation (Non-Medical): Not on file  Physical Activity:   . Days of Exercise per Week: Not on file  . Minutes of Exercise per Session: Not on file  Stress:   . Feeling of Stress : Not on file  Social Connections:   . Frequency of Communication with Friends and Family: Not on file  . Frequency of Social Gatherings with Friends and Family: Not on file  . Attends Religious Services: Not on file  . Active Member of Clubs or Organizations: Not on file  . Attends Archivist Meetings: Not on file  . Marital Status: Not on file     Family History:  The patient's family history includes ADD / ADHD in her son; Diabetes (age of onset: 45) in her mother; Heart attack in her brother; Heart disease in her father and mother; Heart disease (age of onset: 78) in her maternal grandmother; Hyperlipidemia in her paternal grandfather; Hypertension in her father, maternal uncle, and paternal grandfather. There is no history of Sudden death, Colon cancer, Celiac disease, or Inflammatory bowel disease.  ROS:   Please see the history of present illness. Reports occasional "grind" beat sensation of her heart but no sustained tachypalps/presyncope/syncope. All other systems are reviewed and otherwise negative.    EKGs/Labs/Other Studies Reviewed:    Studies reviewed are outlined and summarized above.  2D echo 10/28/18  1. The left ventricle has a  visually estimated ejection fraction of 45%. The cavity size was normal. There is severe concentric left ventricular hypertrophy. Left ventricular diastolic Doppler parameters are consistent with pseudonormalization. Elevated  left ventricular end-diastolic pressure Left ventricular diffuse hypokinesis.  2. The right ventricle has normal systolic function. The cavity was normal. There is no increase in right ventricular wall thickness.  3. Left atrial size was severely dilated.  4. The mitral valve is grossly normal.  5.  The tricuspid valve is grossly normal.  6. The aortic valve is tricuspid. Mild thickening of the aortic valve.  7. Moderate aortic annular calcification.  8. The aortic root is normal in size and structure.  LE venous duplex 10/2018 IMPRESSION: No femoropopliteal and no calf DVT in the visualized calf veins. If clinical symptoms are inconsistent or if there are persistent or worsening symptoms, further imaging (possibly involving the iliac veins) may be warranted.   Cardiac Cath 12/2015 Conclusion  The left ventricular systolic function is normal.  LV end diastolic pressure is mildly elevated.  The left ventricular ejection fraction is 50-55% by visual estimate.  1. No significant CAD 2. Good LV function 3. Mildly elevated LV EDP.  Plan: medical therapy     EKG:  EKG ordered today and personally reviewed: NSR 85bpm, LVH with probable repolarization abnormality -QTc reported as 427ms, hand calculated at 460ms   Also reviewed from recent tracing 05/05/19 - NSR 87bpm, probable LVH with secondary repolarization abnormality, prolonged QT 567ms (in context of mild QRS widening)  Recent Labs: 03/15/2019: Magnesium 1.5 05/05/2019: ALT 29; B Natriuretic Peptide 1,083.0; BUN 24; Creatinine, Ser 1.07; Hemoglobin 9.9; Platelets 191; Potassium 3.5; Sodium 137  Recent Lipid Panel    Component Value Date/Time   CHOL 211 (H) 06/03/2018 0615   TRIG 223 (H) 06/03/2018  0615   HDL 39 (L) 06/03/2018 0615   CHOLHDL 5.4 06/03/2018 0615   VLDL 45 (H) 06/03/2018 0615   LDLCALC 127 (H) 06/03/2018 0615    PHYSICAL EXAM:    VS:  BP (!) 151/100   Pulse 87   Temp 98.4 F (36.9 C) (Temporal)   Ht 5\' 6"  (1.676 m)   Wt 226 lb (102.5 kg)   SpO2 97%   BMI 36.48 kg/m   BMI: Body mass index is 36.48 kg/m.  GEN: Well nourished, well developed AAF, in no acute distress HEENT: normocephalic, atraumatic Neck: no JVD, carotid bruits, or masses Cardiac: RRR; no murmurs, rubs, or gallops, no edema  Respiratory:  clear to auscultation bilaterally, normal work of breathing GI: soft, nontender, nondistended, + BS. Central obesity MS: no deformity or atrophy Skin: warm and dry, no rash Neuro:  Alert and Oriented x 3, Strength and sensation are intact, follows commands Psych: euthymic mood, full affect  Wt Readings from Last 3 Encounters:  06/04/19 226 lb (102.5 kg)  05/05/19 225 lb (102.1 kg)  03/15/19 237 lb (107.5 kg)     ASSESSMENT & PLAN:   1. Chronic combined CHF with NICM - appears generally euvolemic on examination. Per our discussion, will reduce her torsemide back down to 2 tablets BID for now. Her heart failure has been felt due to hypertensive heart disease, but also has a striking family history of heart failure - particularly on mom's side, but also has cousin on dad's side with "artificial heart." It still seems as though she has work to go on her blood pressure. See below for additional commentary. Patient instructions will include 2g sodium restriction, 2L fluid restriction, daily weights. 2. Uncontrolled HTN - remains unclear why BP remains so elevated despite numerous medications at fairly high doses. She affirms compliance. I see that renal duplex has been suggested in the past, will go ahead and order. Given her HTN and headaches, will also order 24 hour urine metanephrines and catecholamines to exclude pheochromocytoma. Will update TSH/free T4 given  her fatigue, elevated BP and headaches. Renin/aldo ratio unlikely to be helpful since she is already on Entresto.  Will also try to get her OSA managed, because this is likely contributing. Could consider endocrine eval for Cushing's if her other workup is unrevealing. Dr. Harl Bowie previously suggested consideration of spironolactone (which was added then removed at some point) or amlodipine. She is less excited about the idea of additional diuretic therapy due to keeping thirsty with her diabetes. Although amlodipine is less ideal with LV dysfunction, her LV dysfunction itself may be driven by uncontrolled HTN so it is worthwhile to give it a try as previously suggested. Will start 5mg  daily. I anticipate she may need 10mg  but need to watch for edema. Of note, she is pending colonoscopy for some intermittent constipation/bowel upset issues - will plan to have her have a virtual visit in 1 week to reassess BP and see if it's down enough to clear her for this. 3. OSA - offered her option of f/u with her new PCP for this versus [facility] virtual care with Dr. Radford Pax in sleep medicine. She would like to proceed with the latter. Sleep study was fairly recent, see Procedures (06/2017, 11/2017). 4. Prolonged QT interval - noted on last several EKGs. Will get BMET, Mg today. Discussed avoidance of QT prolonging medications - asked her to discuss alternatives with the prescriber of Zofran/Phenergan.  Disposition: F/u with Dr. Christella Noa virtually in 1 week for follow-up.  Medication Adjustments/Labs and Tests Ordered: Current medicines are reviewed at length with the patient today.  Concerns regarding medicines are outlined above. Medication changes, Labs and Tests ordered today are summarized above and listed in the Patient Instructions accessible in Encounters.   Signed, Charlie Pitter, PA-C  06/04/2019 3:38 PM    [facility] Location in [place], [place] [address] Ph: [phone]; Fax [phone]

## 2019-06-04 ENCOUNTER — Ambulatory Visit (INDEPENDENT_AMBULATORY_CARE_PROVIDER_SITE_OTHER): Payer: Medicaid Other | Admitting: Physician Assistant

## 2019-06-04 ENCOUNTER — Telehealth: Payer: Self-pay | Admitting: Physician Assistant

## 2019-06-04 ENCOUNTER — Other Ambulatory Visit: Payer: Self-pay

## 2019-06-04 ENCOUNTER — Encounter: Payer: Self-pay | Admitting: Physician Assistant

## 2019-06-04 ENCOUNTER — Telehealth: Payer: Self-pay | Admitting: Gastroenterology

## 2019-06-04 VITALS — BP 151/100 | HR 87 | Temp 98.4°F | Ht 66.0 in | Wt 226.0 lb

## 2019-06-04 DIAGNOSIS — R9431 Abnormal electrocardiogram [ECG] [EKG]: Secondary | ICD-10-CM

## 2019-06-04 DIAGNOSIS — I5042 Chronic combined systolic (congestive) and diastolic (congestive) heart failure: Secondary | ICD-10-CM

## 2019-06-04 DIAGNOSIS — G4733 Obstructive sleep apnea (adult) (pediatric): Secondary | ICD-10-CM

## 2019-06-04 DIAGNOSIS — I428 Other cardiomyopathies: Secondary | ICD-10-CM | POA: Diagnosis not present

## 2019-06-04 DIAGNOSIS — I10 Essential (primary) hypertension: Secondary | ICD-10-CM | POA: Diagnosis not present

## 2019-06-04 DIAGNOSIS — R5383 Other fatigue: Secondary | ICD-10-CM

## 2019-06-04 MED ORDER — TORSEMIDE 20 MG PO TABS: 40.0000 mg | ORAL_TABLET | Freq: Two times a day (BID) | ORAL | 11 refills | Status: DC

## 2019-06-04 MED ORDER — AMLODIPINE BESYLATE 5 MG PO TABS: 5.0000 mg | ORAL_TABLET | Freq: Every day | ORAL | 3 refills | Status: DC

## 2019-06-04 NOTE — Patient Instructions (Addendum)
Medication Instructions:  Your physician has recommended you make the following change in your medication:  Take Torsemide 40 mg Two Times Daily  Take Norvasc 5 mg Daily    *If you need a refill on your cardiac medications before your next appointment, please call your pharmacy*  Lab Work: Your physician recommends that you return for lab work in: Today or Tomorrow  If you have labs (blood work) drawn today and your tests are completely normal, you will receive your results only by: Marland Kitchen MyChart Message (if you have MyChart) OR . A paper copy in the mail If you have any lab test that is abnormal or we need to change your treatment, we will call you to review the results.  Testing/Procedures: Your physician has requested that you have a renal artery duplex. During this test, an ultrasound is used to evaluate blood flow to the kidneys. Allow one hour for this exam. Do not eat after midnight the day before and avoid carbonated beverages. Take your medications as you usually do.   Follow-Up: At [facility], you and your health needs are our priority.  As part of our continuing mission to provide you with exceptional heart care, we have created designated Provider Care Teams.  These Care Teams include your primary Cardiologist (physician) and Advanced Practice Providers (APPs -  Physician Assistants and Nurse Practitioners) who all work together to provide you with the care you need, when you need it.  Your next appointment:   1 week(s)  The format for your next appointment:   Virtual Visit   Provider:   You may see Carlyle Dolly, MD or one of the following Advanced Practice Providers on your designated Care Team:    Bernerd Pho, PA-C   Ermalinda Barrios, [place]    Other Instructions Thank you for choosing [facility]!  Your EKG has shown an abnormality of your QT interval recently. This has normalized on the most recent tracing. However, you should try to avoid Zofran  and phenergan since this can worsen this condition. Please talk to the prescriber of these medicines to discuss alternatives. In the future when receiving a new prescription from a provider, make sure to let them know you've had this finding.  For patients with congestive heart failure, we give them these special instructions:  1. Follow a low-salt diet - you are allowed no more than 2,000mg  of sodium per day. Watch your fluid intake. In general, you should not be taking in more than 2 liters of fluid per day (no more than 8 glasses per day). This includes sources of water in foods like soup, coffee, tea, milk, etc. 2. Weigh yourself on the same scale at same time of day and keep a log. 3. Call your doctor: (Anytime you feel any of the following symptoms)  - 3lb weight gain overnight or 5lb within a few days - Shortness of breath, with or without a dry hacking cough  - Swelling in the hands, feet or stomach  - If you have to sleep on extra pillows at night in order to breathe  IT IS IMPORTANT TO LET YOUR DOCTOR KNOW EARLY ON IF YOU ARE HAVING SYMPTOMS SO WE CAN HELP YOU!

## 2019-06-04 NOTE — Telephone Encounter (Signed)
Pt will keep appointment with Cardiology today.  She is aware we were unable to keep holding a slot for her procedures.  She is aware to follow up with Oncology in the near future. She is aware we will need to schedule her for a follow up. Forwarding to Susan/Stacey to schedule appointment.

## 2019-06-04 NOTE — Telephone Encounter (Signed)
Pre-cert Verification for the following procedure    Renal Study scheduled for 06-06-2019 at [facility]

## 2019-06-04 NOTE — Telephone Encounter (Signed)
Chelsey Santiago, we had been holding a spot for EGD +/- dilation and TCS for patient. I had requested she see cardiology for follow up due to recent admissions regarding CHF. She has not seen cardiology yet (although it looks like she has an appointment later today). Please let her know, due to COVID-19 and changes in scheduling of procedures, we are no longer able to hold a spot for procedures.   She doesn't have a follow-up with [place] scheduled. She needs to follow through with her cardiology appointment today so we will be able to pursue procedures in the near future. We need to get her scheduled for a follow-up in the next 2-4 weeks to discuss getting her scheduled for procedures. Of note, her most recent hemoglobin from December 2020 was 9.9. She has followed with oncology in the past for iron infusions. She also needs to follow-up with them in the very near future.

## 2019-06-04 NOTE — Telephone Encounter (Signed)
Noted  

## 2019-06-04 NOTE — Telephone Encounter (Signed)
OV made and appt card mailed °

## 2019-06-05 ENCOUNTER — Other Ambulatory Visit (HOSPITAL_COMMUNITY)
Admission: RE | Admit: 2019-06-05 | Discharge: 2019-06-05 | Disposition: A | Discharge status: Home / Self Care | Payer: Medicaid Other | Source: Ambulatory Visit | Attending: Physician Assistant | Admitting: Physician Assistant

## 2019-06-05 ENCOUNTER — Telehealth: Payer: Self-pay

## 2019-06-05 ENCOUNTER — Telehealth: Payer: Self-pay | Admitting: *Deleted

## 2019-06-05 DIAGNOSIS — E876 Hypokalemia: Secondary | ICD-10-CM

## 2019-06-05 DIAGNOSIS — I10 Essential (primary) hypertension: Secondary | ICD-10-CM | POA: Insufficient documentation

## 2019-06-05 DIAGNOSIS — G4733 Obstructive sleep apnea (adult) (pediatric): Secondary | ICD-10-CM

## 2019-06-05 LAB — BASIC METABOLIC PANEL
Anion gap: 10 (ref 5–15)
BUN: 20 mg/dL (ref 6–20)
CO2: 29 mmol/L (ref 22–32)
Calcium: 9 mg/dL (ref 8.9–10.3)
Chloride: 95 mmol/L — ABNORMAL LOW (ref 98–111)
Creatinine, Ser: 1.12 mg/dL — ABNORMAL HIGH (ref 0.44–1.00)
GFR calc Af Amer: >60 mL/min (ref >60)
GFR calc non Af Amer: >60 mL/min (ref >60)
Glucose, Bld: 296 mg/dL — ABNORMAL HIGH (ref 70–99)
Potassium: 2.8 mmol/L — ABNORMAL LOW (ref 3.5–5.1)
Sodium: 134 mmol/L — ABNORMAL LOW (ref 135–145)

## 2019-06-05 LAB — MAGNESIUM: Magnesium: 1.5 mg/dL — ABNORMAL LOW (ref 1.7–2.4)

## 2019-06-05 LAB — TSH: TSH: 0.719 u[IU]/mL (ref 0.350–4.500)

## 2019-06-05 LAB — T4, FREE: Free T4: 1.27 ng/dL — ABNORMAL HIGH (ref 0.61–1.12)

## 2019-06-05 MED ORDER — MAGNESIUM 400 MG PO CAPS: 400.0000 mg | ORAL_CAPSULE | Freq: Two times a day (BID) | ORAL | 3 refills | Status: DC

## 2019-06-05 MED ORDER — POTASSIUM CHLORIDE CRYS ER 20 MEQ PO TBCR: 40.0000 meq | EXTENDED_RELEASE_TABLET | Freq: Two times a day (BID) | ORAL | 6 refills | Status: DC

## 2019-06-05 NOTE — Telephone Encounter (Signed)
-----   Message from Imagene Gurney sent at 06/04/2019  4:42 PM EST ----- Regarding: Sleep Clinic Referral Referral sent by Melina Copa to schedule referral sent to Dr. Radford Pax for OSA.

## 2019-06-05 NOTE — Telephone Encounter (Signed)
I spoke with patient, she verbalized to take potassium 40 mg TID today, then BID starting tomorrow. She will also start magnesium 400 mg BID and repeat BMET/MG on Thursday here at [facility] lab

## 2019-06-05 NOTE — Telephone Encounter (Signed)
Edited to add: after she takes 71meq BID of KCl tomorrow, would not have her take any more on Thursday until we can get labs back. Can run stat so we can get them back early on Thurs. (I will be in clinic at [facility] that day).   Charlie Pitter, PA-C  06/05/2019 10:43 AM EST    Please call patient. Potassium level is extremely low along with magnesium level. This could also be making her feel fatigued along with the other issues she has going on. Kidney function looks slightly increased from the last one but actually quite stable when you look at historical values so I am not concerned about this (baseline around 0.9-1.2).  Would hold torsemide for today and tomorrow. (If she already took today, don't stress.) Please confirm she is taking potassium as prescribed, listed as 65meq BID. Would have her take potassium 103meq TID x today then 33meq BID starting tomorrow. Start MagOx 400mg  BID today. Recheck BMET/Mg on Thursday early AM 06/07/19 so we can decide about whether it's safe to resume torsemide. Reviewed plan with Dr. Harl Bowie who is in agreement.

## 2019-06-05 NOTE — Telephone Encounter (Signed)
Staff message sent to Chelsey Santiago ok to schedule sleep study. Patient has Medicaid and no PA is required.

## 2019-06-06 ENCOUNTER — Other Ambulatory Visit (HOSPITAL_COMMUNITY)
Admission: AD | Admit: 2019-06-06 | Discharge: 2019-06-06 | Disposition: A | Discharge status: Home / Self Care | Payer: Medicaid Other | Source: Skilled Nursing Facility | Attending: Physician Assistant | Admitting: Physician Assistant

## 2019-06-06 DIAGNOSIS — I10 Essential (primary) hypertension: Secondary | ICD-10-CM | POA: Insufficient documentation

## 2019-06-07 ENCOUNTER — Other Ambulatory Visit (HOSPITAL_BASED_OUTPATIENT_CLINIC_OR_DEPARTMENT_OTHER): Payer: Self-pay

## 2019-06-12 ENCOUNTER — Other Ambulatory Visit (HOSPITAL_COMMUNITY): Admission: RE | Admit: 2019-06-12 | Payer: Medicaid Other | Source: Ambulatory Visit

## 2019-06-12 ENCOUNTER — Encounter: Payer: Medicaid Other | Admitting: Student

## 2019-06-12 NOTE — Progress Notes (Deleted)
Patient did not answer despite 5 attempts at the time of her Virtual Visit.

## 2019-06-13 NOTE — Progress Notes (Signed)
This encounter was created in error - please disregard.

## 2019-06-15 ENCOUNTER — Encounter: Payer: Self-pay | Admitting: Student

## 2019-06-15 LAB — METANEPHRINES, URINE, 24 HOUR
Metaneph Total, Ur: 50 ug/L
Metanephrines, 24H Ur: 91 ug/24 hr (ref 36–209)
Normetanephrine, 24H Ur: 215 ug/24 hr (ref 131–612)
Normetanephrine, Ur: 118 ug/L
Total Volume: 1825

## 2019-06-20 NOTE — Telephone Encounter (Signed)
Patient is scheduled for lab study on 07/04/19. Pt is scheduled for COVID screening 07/02/19 prior to SS. Patient understands her sleep study will be done at [facility] sleep lab. Patient understands she will receive a sleep packet in a week or so. Patient understands to call if she does not receive the sleep packet in a timely manner. Patient agrees with treatment and thanked me for call.

## 2019-06-20 NOTE — Telephone Encounter (Signed)
RE: Sleep Clinic Referral Chelsey Santiago, [place], [place], CMA  Patient has Medicaid and does not require getting a PA. Ok to schedule.

## 2019-06-23 LAB — CATECHOLAMINES,UR.,FREE,24 HR
Dopamine, Rand Ur: 48 ug/L
Dopamine, Ur, 24Hr: 88 ug/24 hr (ref 0–510)
Epinephrine, Rand Ur: 1 ug/L
Epinephrine, U, 24Hr: 2 ug/24 hr (ref 0–20)
Norepinephrine, Rand Ur: 11 ug/L
Norepinephrine,U,24H: 20 ug/24 hr (ref 0–135)
Total Volume: 1825

## 2019-07-02 ENCOUNTER — Other Ambulatory Visit: Payer: Self-pay

## 2019-07-02 ENCOUNTER — Other Ambulatory Visit (HOSPITAL_COMMUNITY)
Admission: RE | Admit: 2019-07-02 | Discharge: 2019-07-02 | Disposition: A | Discharge status: Home / Self Care | Payer: Medicaid Other | Source: Ambulatory Visit | Attending: Cardiology | Admitting: Cardiology

## 2019-07-02 ENCOUNTER — Telehealth: Payer: Self-pay | Admitting: Gastroenterology

## 2019-07-02 NOTE — Telephone Encounter (Signed)
No procedures are currently scheduled for pt.  Called and informed pt no procedures are scheduled. Per previous notes we were unable to keep holding spot for procedure. Pt was also no show with cardiology in past. Advised her to have OV with [facility] 07/04/19 and if [facility] orders procedures we will schedule. Pt said so I may have to wait couple months for procedure. Advised scheduling will be addressed if [facility] orders procedure. She hung up the phone.

## 2019-07-02 NOTE — Telephone Encounter (Signed)
[phone] patient called asking if her procedure was still on,  She is coming Thursday for a follow up and said that she received letter from [place] about procedure being changed.  I did not see anything in epic

## 2019-07-03 NOTE — Progress Notes (Deleted)
Referring Provider: Rosita Fire, MD Primary Care Physician:  Rosita Fire, MD Primary GI Physician: Dr. Oneida Alar  No chief complaint on file.   HPI:   Chelsey Santiago is a 44 y.o. female presenting today with a history of diabetes, CHF, sleep apnea, HTN, and chronic microcytic anemia with very low normal iron studies.  Hemoglobin typically in the 9-11 range.  Also with history of menorrhagia which may be contributing to IDA.  Has followed with hematology and received iron infusions in December 2018 in January 2019.  Last saw hematology in March 2019.  No prior EGD or TCS. She presents today for follow-up of IDA, GERD, dysphagia, epigastric abdominal pain, and constipation.   Last seen in our office on 03/14/2019 for the same.  At that time, hemoglobin was stable and she was without any overt GI bleeding.  GERD symptoms not adequately controlled on omeprazole as needed.  Continue with dysphagia to solid foods.  Reported 3-4-week history of epigastric pain that worsened postprandially.  Stated she was diagnosed with H. pylori at [facility] 3 weeks ago s/p treatment with only mild improvement in abdominal pain.  Goody powders a few times a week.  Also reported fluctuating bowel habits but predominantly with constipation.  Plans at that time included updating CBC and iron panel, stop omeprazole and start Protonix 40 mg twice daily, avoid all NSAIDs, hold a spot for EGD +/-dilation and TCS.  Due to, two recent hospitalizations for acute on chronic CHF as well as hypertensive urgency at the time of office visit, she was advised to proceed to the ED as well as follow-up with cardiology prior to scheduling procedures.  We would request cardiac clearance.  She proceeded to the ED. BP 228/148. Hyprtensive urgency corrected with usual medications including Entresto, coreg, and addition of lebatolol. Also incidentally found to have hypokalemia and hypomagnesemia s/p treatment. Hemoglobin was stable at  10.1. No ACS suspected.   Patient no showed to her cardiology appointment on 03/27/2019. Letter sent on 03/26/2019 reminding patient she needed cardiology evaluation prior to procedures. Contacted patient again on 04/27/2019 reminding her to see cardiology.  She stated she would schedule follow-up.  She was seen in the ED on 05/05/19 for shortness of breath with mild worsening of CHF.  Diuretics were increased and she was advised to follow-up with PCP.  Due to COVID-19, we were no longer able to hold spot for procedures.  Patient was contacted on 06/04/2019 and made aware of this.  She had not seen cardiology at that time but she did have an appointment later that day.  Advised to make a follow-up ointment in 2-4 weeks to discuss rescheduling procedures.  She is advised to follow-up with hematology/oncology for chronic anemia.  Cardiology follow-up on 06/04/2019.  She was euvolemic at that time.  Torsemide was decreased back down to 2 tablets twice daily.  Advised on 2 g sodium restriction and 2 L fluid restriction with daily weights.  For uncontrolled HTN, amlodipine 5 mg was added.  Additional work-up planned including 24-hour urine metanephrines, catecholamines to evaluate for pheochromocytoma, update BMP, renal duplex, update TSH/free T4, and follow-up with Dr. Radford Pax in sleep medicine for OSA.  Noted prolonged QT interval on last several EGDs.  Recommendations to avoid QT prolonging medications.  Plans for virtual visit in 1 week to evaluate blood pressure and provide possible clearance for GI procedures.  Catecholamines and metanephrines within normal limits.  TSH within normal limits. Free T4 slightly elevated. BMP with  potassium 2.8 and magnesium 1.5.  She was provided with potassium and magnesium supplementation.  She was to have BMP rechecked but this was not completed.  Patient missed follow-up cards appt on 06/12/19 and no showed to ECHO/Vascular on 06/21/19.   Today:    Past Medical History:   Diagnosis Date  . Anemia    H&H of 10.6/33 and 07/2008 and 11.9/35 and 09/2010  . Anxiety   . Chronic combined systolic and diastolic CHF (congestive heart failure) ([place])   . Depression with anxiety   . Diabetes mellitus without complication ([place])   . Hypertension   . Hypertensive heart disease 2009   Pulmonary edema postpartum; mild to moderate mitral regurgitation when hospitalized for CHF in 2009; Echocardiogram in 12/2009-no MR and normal EF; normal CXR in 09/2010  . Migraine headache   . Miscarriage 03/19/2013  . Obesity 04/16/2009  . Osteoarthritis, knee 03/29/2011  . Preeclampsia   . Pulmonary edema   . Sleep apnea   . Threatened abortion in early pregnancy 03/15/2013    Past Surgical History:  Procedure Laterality Date  . BREAST REDUCTION SURGERY  2002  . CARDIAC CATHETERIZATION N/A 12/22/2015   Procedure: Left Heart Cath and Coronary Angiography;  Surgeon: Peter M Martinique, MD;  Location: [facility];  Service: Cardiovascular;  Laterality: N/A;  . CESAREAN SECTION N/A 04/09/2014   Procedure: CESAREAN SECTION;  Surgeon: Mora Bellman, MD;  Location: [facility];  Service: Obstetrics;  Laterality: N/A;  . CHOLECYSTECTOMY      Current Outpatient Medications  Medication Sig Dispense Refill  . albuterol (PROVENTIL HFA;VENTOLIN HFA) 108 (90 Base) MCG/ACT inhaler Inhale 1 puff into the lungs every 6 (six) hours as needed for wheezing or shortness of breath.    Marland Kitchen amLODipine (NORVASC) 5 MG tablet Take 1 tablet (5 mg total) by mouth daily. 90 tablet 3  . ARIPiprazole (ABILIFY) 10 MG tablet Take 1 tablet (10 mg total) by mouth daily.    Marland Kitchen aspirin EC 81 MG tablet Take 81 mg by mouth daily.    Marland Kitchen atorvastatin (LIPITOR) 20 MG tablet Take 1 tablet (20 mg total) by mouth daily at 6 PM. 30 tablet 1  . budesonide-formoterol (SYMBICORT) 80-4.5 MCG/ACT inhaler Inhale 2 puffs into the lungs 2 (two) times daily. 1 Inhaler 12  . carvedilol (COREG) 25 MG tablet Take 2 tablets (50 mg total) by mouth  2 (two) times daily with a meal. 60 tablet 0  . clonazePAM (KLONOPIN) 0.5 MG tablet Take 0.5 mg by mouth 2 (two) times daily.    Marland Kitchen gabapentin (NEURONTIN) 600 MG tablet Take 600 mg by mouth 3 (three) times daily.     . hydrALAZINE (APRESOLINE) 100 MG tablet Take 1 tablet (100 mg total) by mouth 3 (three) times daily. 30 tablet 1  . insulin glargine (LANTUS) 100 UNIT/ML injection Inject 30 Units at bedtime into the skin.     Marland Kitchen insulin lispro (HUMALOG) 100 UNIT/ML injection Inject 15 Units into the skin 3 (three) times daily before meals.     . Ipratropium-Albuterol (COMBIVENT RESPIMAT) 20-100 MCG/ACT AERS respimat Inhale 1 puff into the lungs every 6 (six) hours as needed for wheezing or shortness of breath. 1 Inhaler 0  . linaclotide (LINZESS) 145 MCG CAPS capsule Take 1 capsule (145 mcg total) by mouth daily before breakfast. 30 capsule 5  . Magnesium 400 MG CAPS Take 400 mg by mouth 2 (two) times daily. 180 capsule 3  . metFORMIN (GLUCOPHAGE) 500 MG tablet Take 500  mg 2 (two) times daily with a meal by mouth.    . methocarbamol (ROBAXIN) 500 MG tablet Take 1 tablet (500 mg total) by mouth every 8 (eight) hours as needed for muscle spasms. 8 tablet 0  . nitroGLYCERIN (NITROSTAT) 0.4 MG SL tablet Place 1 tablet (0.4 mg total) under the tongue every 5 (five) minutes as needed for chest pain. 30 tablet 12  . ondansetron (ZOFRAN) 4 MG tablet Take 4 mg by mouth daily as needed for nausea or vomiting.    . pantoprazole (PROTONIX) 40 MG tablet Take 1 tablet (40 mg total) by mouth 2 (two) times daily before a meal. 60 tablet 5  . potassium chloride SA (KLOR-CON) 20 MEQ tablet Take 2 tablets (40 mEq total) by mouth 2 (two) times daily. 120 tablet 6  . promethazine (PHENERGAN) 25 MG tablet Take 1 tablet (25 mg total) by mouth every 6 (six) hours as needed for nausea or vomiting. 30 tablet 0  . pyridostigmine (MESTINON) 60 MG tablet Take 60 mg by mouth every 8 (eight) hours.    . sacubitril-valsartan  (ENTRESTO) 97-103 MG Take 1 tablet by mouth 2 (two) times daily. 60 tablet 6  . torsemide (DEMADEX) 20 MG tablet Take 2 tablets (40 mg total) by mouth 2 (two) times daily. 120 tablet 11  . traZODone (DESYREL) 100 MG tablet Take 2 tablets (200 mg total) by mouth at bedtime.     No current facility-administered medications for this visit.    Allergies as of 07/04/2019 - Review Complete 06/04/2019  Allergen Reaction Noted  . Diclofenac Swelling 07/12/2011  . Tramadol Nausea And Vomiting and Nausea Only 05/29/2011  . Vicodin [hydrocodone-acetaminophen] Itching and Nausea Only 05/29/2011  . Hydrocodone bitartrate er Itching 05/06/2017    Family History  Problem Relation Age of Onset  . Diabetes Mother 23  . Heart disease Mother   . Hyperlipidemia Paternal Grandfather   . Hypertension Paternal Grandfather   . Heart disease Father   . Hypertension Father   . Heart disease Maternal Grandmother 60  . ADD / ADHD Son   . Hypertension Maternal Uncle   . Heart attack Brother   . Sudden death Neg Hx   . Colon cancer Neg Hx   . Celiac disease Neg Hx   . Inflammatory bowel disease Neg Hx     Social History   Socioeconomic History  . Marital status: Married    Spouse name: Not on file  . Number of children: Not on file  . Years of education: Not on file  . Highest education level: Not on file  Occupational History  . Occupation: unemployed    Fish farm manager: UNEMPLOYED  Tobacco Use  . Smoking status: Never Smoker  . Smokeless tobacco: Never Used  Substance and Sexual Activity  . Alcohol use: Yes    Comment: occ  . Drug use: No  . Sexual activity: Yes    Birth control/protection: Surgical    Comment: tubal  Other Topics Concern  . Not on file  Social History Narrative   Lives in [place]   Engaged/boyfriend (father to youngest chid)   4 children: daughter (32 as of 2013), sons (15, 83, 52 as of 2013)   Religion: christian   Social Determinants of Radio broadcast assistant Strain:    . Difficulty of Paying Living Expenses: Not on file  Food Insecurity:   . Worried About Charity fundraiser in the Last Year: Not on file  . Ran Out of  Food in the Last Year: Not on file  Transportation Needs:   . Lack of Transportation (Medical): Not on file  . Lack of Transportation (Non-Medical): Not on file  Physical Activity:   . Days of Exercise per Week: Not on file  . Minutes of Exercise per Session: Not on file  Stress:   . Feeling of Stress : Not on file  Social Connections:   . Frequency of Communication with Friends and Family: Not on file  . Frequency of Social Gatherings with Friends and Family: Not on file  . Attends Religious Services: Not on file  . Active Member of Clubs or Organizations: Not on file  . Attends Archivist Meetings: Not on file  . Marital Status: Not on file    Review of Systems: Gen: Denies fever, chills, anorexia. Denies fatigue, weakness, weight loss.  CV: Denies chest pain, palpitations, syncope, peripheral edema, and claudication. Resp: Denies dyspnea at rest, cough, wheezing, coughing up blood, and pleurisy. GI: Denies vomiting blood, jaundice, and fecal incontinence.   Denies dysphagia or odynophagia. Derm: Denies rash, itching, dry skin Psych: Denies depression, anxiety, memory loss, confusion. No homicidal or suicidal ideation.  Heme: Denies bruising, bleeding, and enlarged lymph nodes.  Physical Exam: There were no vitals taken for this visit. General:   Alert and oriented. No distress noted. Pleasant and cooperative.  Head:  Normocephalic and atraumatic. Eyes:  Conjuctiva clear without scleral icterus. Mouth:  Oral mucosa pink and moist. Good dentition. No lesions. Heart:  S1, S2 present without murmurs appreciated. Lungs:  Clear to auscultation bilaterally. No wheezes, rales, or rhonchi. No distress.  Abdomen:  +BS, soft, non-tender and non-distended. No rebound or guarding. No HSM or masses noted. Msk:  Symmetrical  without gross deformities. Normal posture. Extremities:  Without edema. Neurologic:  Alert and  oriented x4 Psych:  Alert and cooperative. Normal mood and affect.

## 2019-07-04 ENCOUNTER — Encounter (HOSPITAL_COMMUNITY): Payer: Self-pay | Admitting: Specialist

## 2019-07-04 ENCOUNTER — Other Ambulatory Visit: Payer: Self-pay

## 2019-07-04 ENCOUNTER — Encounter (HOSPITAL_COMMUNITY): Payer: Self-pay | Admitting: Emergency Medicine

## 2019-07-04 ENCOUNTER — Emergency Department (HOSPITAL_COMMUNITY): Payer: Medicaid Other

## 2019-07-04 ENCOUNTER — Ambulatory Visit: Payer: Medicaid Other | Admitting: Gastroenterology

## 2019-07-04 ENCOUNTER — Telehealth: Payer: Self-pay | Admitting: Gastroenterology

## 2019-07-04 ENCOUNTER — Ambulatory Visit: Payer: Medicaid Other

## 2019-07-04 ENCOUNTER — Encounter: Payer: Self-pay | Admitting: Gastroenterology

## 2019-07-04 ENCOUNTER — Emergency Department (HOSPITAL_COMMUNITY)
Admission: EM | Admit: 2019-07-04 | Discharge: 2019-07-04 | Disposition: A | Discharge status: Home / Self Care | Payer: Medicaid Other | Attending: Emergency Medicine | Admitting: Emergency Medicine

## 2019-07-04 DIAGNOSIS — I110 Hypertensive heart disease with heart failure: Secondary | ICD-10-CM | POA: Diagnosis not present

## 2019-07-04 DIAGNOSIS — R079 Chest pain, unspecified: Secondary | ICD-10-CM | POA: Diagnosis present

## 2019-07-04 DIAGNOSIS — Z79899 Other long term (current) drug therapy: Secondary | ICD-10-CM | POA: Diagnosis not present

## 2019-07-04 DIAGNOSIS — E876 Hypokalemia: Secondary | ICD-10-CM | POA: Insufficient documentation

## 2019-07-04 DIAGNOSIS — E119 Type 2 diabetes mellitus without complications: Secondary | ICD-10-CM | POA: Diagnosis not present

## 2019-07-04 DIAGNOSIS — Z7982 Long term (current) use of aspirin: Secondary | ICD-10-CM | POA: Diagnosis not present

## 2019-07-04 DIAGNOSIS — I5042 Chronic combined systolic (congestive) and diastolic (congestive) heart failure: Secondary | ICD-10-CM | POA: Diagnosis not present

## 2019-07-04 LAB — BASIC METABOLIC PANEL
Anion gap: 13 (ref 5–15)
BUN: 24 mg/dL — ABNORMAL HIGH (ref 6–20)
CO2: 29 mmol/L (ref 22–32)
Calcium: 9.3 mg/dL (ref 8.9–10.3)
Chloride: 90 mmol/L — ABNORMAL LOW (ref 98–111)
Creatinine, Ser: 1.12 mg/dL — ABNORMAL HIGH (ref 0.44–1.00)
GFR calc Af Amer: >60 mL/min (ref >60)
GFR calc non Af Amer: 60 mL/min — ABNORMAL LOW (ref >60)
Glucose, Bld: 361 mg/dL — ABNORMAL HIGH (ref 70–99)
Potassium: 2.9 mmol/L — ABNORMAL LOW (ref 3.5–5.1)
Sodium: 132 mmol/L — ABNORMAL LOW (ref 135–145)

## 2019-07-04 LAB — CBC
HCT: 41 % (ref 36.0–46.0)
Hemoglobin: 12.4 g/dL (ref 12.0–15.0)
MCH: 22.7 pg — ABNORMAL LOW (ref 26.0–34.0)
MCHC: 30.2 g/dL (ref 30.0–36.0)
MCV: 75 fL — ABNORMAL LOW (ref 80.0–100.0)
Platelets: 271 10*3/uL (ref 150–400)
RBC: 5.47 MIL/uL — ABNORMAL HIGH (ref 3.87–5.11)
RDW: 18.3 % — ABNORMAL HIGH (ref 11.5–15.5)
WBC: 9.7 10*3/uL (ref 4.0–10.5)
nRBC: 0 % (ref 0.0–0.2)

## 2019-07-04 LAB — TROPONIN I (HIGH SENSITIVITY)
Troponin I (High Sensitivity): 25 ng/L — ABNORMAL HIGH (ref <18)
Troponin I (High Sensitivity): 33 ng/L — ABNORMAL HIGH (ref <18)

## 2019-07-04 LAB — MAGNESIUM: Magnesium: 1.6 mg/dL — ABNORMAL LOW (ref 1.7–2.4)

## 2019-07-04 LAB — PREGNANCY, URINE: Preg Test, Ur: NEGATIVE

## 2019-07-04 MED ORDER — ASPIRIN 81 MG PO CHEW
324.0000 mg | CHEWABLE_TABLET | Freq: Once | ORAL | Status: AC
  Administered 2019-07-04: 20:00:00 324 mg via ORAL
  Filled 2019-07-04: qty 4

## 2019-07-04 MED ORDER — MAGNESIUM SULFATE 2 GM/50ML IV SOLN
2.0000 g | Freq: Once | INTRAVENOUS | Status: AC
  Administered 2019-07-04: 2 g via INTRAVENOUS
  Filled 2019-07-04: qty 50

## 2019-07-04 MED ORDER — IOHEXOL 350 MG/ML SOLN
100.0000 mL | Freq: Once | INTRAVENOUS | Status: AC | PRN
  Administered 2019-07-04: 100 mL via INTRAVENOUS

## 2019-07-04 MED ORDER — MAG-OXIDE 200 MG PO TABS: 200.0000 mg | ORAL_TABLET | Freq: Every day | ORAL | 0 refills | Status: AC

## 2019-07-04 MED ORDER — POTASSIUM CHLORIDE 10 MEQ/100ML IV SOLN
10.0000 meq | Freq: Once | INTRAVENOUS | Status: AC
  Administered 2019-07-04: 10 meq via INTRAVENOUS
  Filled 2019-07-04: qty 100

## 2019-07-04 MED ORDER — ACETAMINOPHEN 325 MG PO TABS
650.0000 mg | ORAL_TABLET | Freq: Once | ORAL | Status: AC
  Administered 2019-07-04: 21:00:00 650 mg via ORAL
  Filled 2019-07-04: qty 2

## 2019-07-04 MED ORDER — POTASSIUM CHLORIDE CRYS ER 20 MEQ PO TBCR
40.0000 meq | EXTENDED_RELEASE_TABLET | Freq: Once | ORAL | Status: AC
  Administered 2019-07-04: 40 meq via ORAL
  Filled 2019-07-04: qty 2

## 2019-07-04 MED ORDER — NITROGLYCERIN 0.4 MG SL SUBL
0.4000 mg | SUBLINGUAL_TABLET | SUBLINGUAL | Status: DC | PRN
  Administered 2019-07-04: 0.4 mg via SUBLINGUAL
  Filled 2019-07-04 (×2): qty 1

## 2019-07-04 NOTE — Telephone Encounter (Signed)
PATIENT WAS A NO SHOW AND LETTER SENT  °

## 2019-07-04 NOTE — ED Provider Notes (Signed)
[facility] EMERGENCY DEPARTMENT Provider Note   CSN: [id number] Arrival date & time: 07/04/19  1931     History Chief Complaint  Patient presents with  . Chest Pain    Chelsey Santiago is a 44 y.o. female.  HPI 44 year old female presents with back pain and chest pain.  Was cooking dinner when she all of a sudden developed pain in the middle of her back and pain in the anterior chest.  Felt like it was going up her neck.  No shortness of breath.  No abdominal pain or vomiting.  Pain did not improve with Protonix.  Tried a nitroglycerin with partial relief.  Pain is about 8 out of 10.  Had pain similar to this about 4 days ago of unclear etiology.  That time it went away with nitroglycerin and she did not seek care.  No leg swelling.   Past Medical History:  Diagnosis Date  . Anemia    H&H of 10.6/33 and 07/2008 and 11.9/35 and 09/2010  . Anxiety   . Chronic combined systolic and diastolic CHF (congestive heart failure) ([place])   . Depression with anxiety   . Diabetes mellitus without complication ([place])   . Hypertension   . Hypertensive heart disease 2009   Pulmonary edema postpartum; mild to moderate mitral regurgitation when hospitalized for CHF in 2009; Echocardiogram in 12/2009-no MR and normal EF; normal CXR in 09/2010  . Migraine headache   . Miscarriage 03/19/2013  . Obesity 04/16/2009  . Osteoarthritis, knee 03/29/2011  . Preeclampsia   . Pulmonary edema   . Sleep apnea   . Threatened abortion in early pregnancy 03/15/2013    Patient Active Problem List   Diagnosis Date Noted  . Abdominal pain, epigastric 03/14/2019  . Dysphagia 03/14/2019  . Gastroesophageal reflux disease 03/14/2019  . Class 2 obesity   . Acute exacerbation of CHF (congestive heart failure) ([place]) 10/26/2018  . Chronic combined systolic and diastolic CHF (congestive heart failure) ([place]) 06/02/2018  . Uncontrolled type 2 diabetes mellitus with hyperglycemia ([place]) 06/02/2018  . Influenza B 05/13/2018    . Hypomagnesemia 05/12/2018  . Headache 05/12/2018  . Upper respiratory tract infection   . HCAP (healthcare-associated pneumonia)   . Constipation 10/17/2017  . Bad headache   . CHF exacerbation ([place]) 09/29/2017  . Iron deficiency anemia 05/16/2017  . Vitamin D deficiency 11/25/2016  . Iron deficiency 11/25/2016  . History of acute myocardial infarction 10/05/2016  . CHF (congestive heart failure) ([place]) 05/06/2016  . Diabetes mellitus with complication ([place]) [id number]  . Leukocytosis 05/06/2016  . Neuropathy 05/06/2016  . Depression 04/16/2016  . Chronic tension-type headache, not intractable 04/16/2016  . AKI (acute kidney injury) ([place])   . Hyperkalemia   . Nonischemic cardiomyopathy ([place])   . Acute on chronic combined systolic and diastolic ACC/AHA stage C congestive heart failure ([place]) 04/03/2016  . Acute on chronic combined systolic and diastolic CHF, NYHA class 4 ([place]) 04/03/2016  . Hypertensive emergency 04/03/2016  . Cardiomyopathy due to hypertension ([place]) 12/22/2015  . Normal coronary arteries 12/22/2015  . Troponin level elevated 12/22/2015  . NSTEMI (non-ST elevated myocardial infarction) ([place]) 12/20/2015  . Dental infection 10/10/2015  . Chest pain 09/05/2015  . Systolic CHF, chronic ([place]) 09/05/2015  . LLQ pain   . Type 2 diabetes mellitus without complication ([place]) [id number]  . Essential hypertension   . 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 Hypertensive urgency 04/22/2014  . Acute CHF ([place]) 04/22/2014  . S/P cesarean section 04/11/2014  . Acute  pulmonary edema ([place]) 04/11/2014  . Postoperative anemia 04/11/2014  . Elevated serum creatinine 04/11/2014  . Preeclampsia, severe 04/09/2014  . Pre-eclampsia superimposed on chronic hypertension, antepartum 04/08/2014  . Dyspnea 04/08/2014  . Polyhydramnios in third trimester, antepartum 03/14/2014  . Abnormal maternal glucose tolerance, antepartum 03/11/2014  . High-risk pregnancy 03/11/2014  . Pre-existing  essential hypertension complicating pregnancy 0000000  . Impaired glucose tolerance during pregnancy, antepartum 11/27/2013  . Leiomyoma of uterus 11/22/2013  . History of gestational diabetes in prior pregnancy, currently pregnant in first trimester 11/22/2013  . Hx of preeclampsia, prior pregnancy, currently pregnant 11/22/2013  . Short interval between pregnancies affecting pregnancy, antepartum 11/22/2013  . Supervision of high-risk pregnancy of elderly primigravida (>= 77 years old at delivery), third trimester 11/22/2013  . Miscarriage 03/19/2013  . Major depressive disorder, single episode, unspecified 09/27/2011  . Hypertension   . Hypertensive cardiovascular disease   . Microcytic anemia   . Osteoarthrosis involving lower leg 03/29/2011  . Hypokalemia 12/12/2009  . OSA on CPAP 12/09/2009  . Morbid obesity ([place]) 04/16/2009    Past Surgical History:  Procedure Laterality Date  . BREAST REDUCTION SURGERY  2002  . CARDIAC CATHETERIZATION N/A 12/22/2015   Procedure: Left Heart Cath and Coronary Angiography;  Surgeon: Peter M Martinique, MD;  Location: [facility];  Service: Cardiovascular;  Laterality: N/A;  . CESAREAN SECTION N/A 04/09/2014   Procedure: CESAREAN SECTION;  Surgeon: Mora Bellman, MD;  Location: [facility];  Service: Obstetrics;  Laterality: N/A;  . CHOLECYSTECTOMY       OB History    Gravida  11   Para  6   Term  5   Preterm  1   AB  5   Living  6     SAB  3   TAB  2   Ectopic      Multiple  0   Live Births  6           Family History  Problem Relation Age of Onset  . Diabetes Mother 90  . Heart disease Mother   . Hyperlipidemia Paternal Grandfather   . Hypertension Paternal Grandfather   . Heart disease Father   . Hypertension Father   . Heart disease Maternal Grandmother 60  . ADD / ADHD Son   . Hypertension Maternal Uncle   . Heart attack Brother   . Sudden death Neg Hx   . Colon cancer Neg Hx   . Celiac disease Neg Hx     . Inflammatory bowel disease Neg Hx     Social History   Tobacco Use  . Smoking status: Never Smoker  . Smokeless tobacco: Never Used  Substance Use Topics  . Alcohol use: Yes    Comment: occ  . Drug use: No    Home Medications Prior to Admission medications   Medication Sig Start Date End Date Taking? Authorizing Provider  albuterol (PROVENTIL HFA;VENTOLIN HFA) 108 (90 Base) MCG/ACT inhaler Inhale 1 puff into the lungs every 6 (six) hours as needed for wheezing or shortness of breath.    [provider]  amLODipine (NORVASC) 5 MG tablet Take 1 tablet (5 mg total) by mouth daily. 06/04/19 09/02/19  Dunn, Nedra Hai, PA-C  ARIPiprazole (ABILIFY) 10 MG tablet Take 1 tablet (10 mg total) by mouth daily. 10/30/18   Barton Dubois, MD  aspirin EC 81 MG tablet Take 81 mg by mouth daily.    [provider]  atorvastatin (LIPITOR) 20 MG tablet Take  1 tablet (20 mg total) by mouth daily at 6 PM. 06/03/18   Tat, Shanon Brow, MD  budesonide-formoterol [facility]) 80-4.5 MCG/ACT inhaler Inhale 2 puffs into the lungs 2 (two) times daily. 05/04/18   Manuella Ghazi, Pratik D, DO  carvedilol (COREG) 25 MG tablet Take 2 tablets (50 mg total) by mouth 2 (two) times daily with a meal. 05/09/19   Branch, Alphonse Guild, MD  clonazePAM (KLONOPIN) 0.5 MG tablet Take 0.5 mg by mouth 2 (two) times daily. 12/19/18   [provider]  gabapentin (NEURONTIN) 600 MG tablet Take 600 mg by mouth 3 (three) times daily.     [provider]  hydrALAZINE (APRESOLINE) 100 MG tablet Take 1 tablet (100 mg total) by mouth 3 (three) times daily. 06/03/18   Orson Eva, MD  insulin glargine (LANTUS) 100 UNIT/ML injection Inject 30 Units at bedtime into the skin.     [provider]  insulin lispro (HUMALOG) 100 UNIT/ML injection Inject 15 Units into the skin 3 (three) times daily before meals.     [provider]  Ipratropium-Albuterol (COMBIVENT RESPIMAT) 20-100 MCG/ACT AERS respimat Inhale 1 puff into  the lungs every 6 (six) hours as needed for wheezing or shortness of breath. 05/04/18   Manuella Ghazi, Pratik D, DO  linaclotide Rolan Lipa) 145 MCG CAPS capsule Take 1 capsule (145 mcg total) by mouth daily before breakfast. 03/14/19   Erenest Rasher, PA-C  Magnesium 400 MG CAPS Take 400 mg by mouth 2 (two) times daily. 06/05/19   Dunn, Nedra Hai, PA-C  metFORMIN (GLUCOPHAGE) 500 MG tablet Take 500 mg 2 (two) times daily with a meal by mouth.    [provider]  methocarbamol (ROBAXIN) 500 MG tablet Take 1 tablet (500 mg total) by mouth every 8 (eight) hours as needed for muscle spasms. 01/02/19   Davonna Belling, MD  nitroGLYCERIN (NITROSTAT) 0.4 MG SL tablet Place 1 tablet (0.4 mg total) under the tongue every 5 (five) minutes as needed for chest pain. 09/05/15   Kathie Dike, MD  ondansetron (ZOFRAN) 4 MG tablet Take 4 mg by mouth daily as needed for nausea or vomiting.    [provider]  pantoprazole (PROTONIX) 40 MG tablet Take 1 tablet (40 mg total) by mouth 2 (two) times daily before a meal. 03/14/19   Jodi Mourning, Tivis Ringer, PA-C  potassium chloride SA (KLOR-CON) 20 MEQ tablet Take 2 tablets (40 mEq total) by mouth 2 (two) times daily. 06/05/19   Dunn, Nedra Hai, PA-C  promethazine (PHENERGAN) 25 MG tablet Take 1 tablet (25 mg total) by mouth every 6 (six) hours as needed for nausea or vomiting. 02/06/19   Idol, Almyra Free, PA-C  pyridostigmine (MESTINON) 60 MG tablet Take 60 mg by mouth every 8 (eight) hours.    [provider]  sacubitril-valsartan (ENTRESTO) 97-103 MG Take 1 tablet by mouth 2 (two) times daily. 03/16/19   Arnoldo Lenis, MD  torsemide (DEMADEX) 20 MG tablet Take 2 tablets (40 mg total) by mouth 2 (two) times daily. 06/04/19 09/02/19  Dunn, Nedra Hai, PA-C  traZODone (DESYREL) 100 MG tablet Take 2 tablets (200 mg total) by mouth at bedtime. 10/29/18   Barton Dubois, MD    Allergies    Diclofenac, Tramadol, Vicodin [hydrocodone-acetaminophen], and Hydrocodone  bitartrate er  Review of Systems   Review of Systems  Respiratory: Negative for shortness of breath.   Cardiovascular: Positive for chest pain. Negative for leg swelling.  Gastrointestinal: Negative for abdominal pain and vomiting.  Musculoskeletal: Positive for  back pain.  All other systems reviewed and are negative.   Physical Exam Updated Vital Signs BP (!) 144/93   Pulse 83   Temp 97.6 F (36.4 C) (Oral)   Resp (!) 21   Ht 5\' 6"  (1.676 m)   Wt 104.3 kg   LMP 06/24/2019   SpO2 97%   BMI 37.12 kg/m   Physical Exam Vitals and nursing note reviewed.  Constitutional:      General: She is not in acute distress.    Appearance: She is well-developed. She is obese. She is not ill-appearing or diaphoretic.  HENT:     Head: Normocephalic and atraumatic.     Right Ear: External ear normal.     Left Ear: External ear normal.     Nose: Nose normal.  Eyes:     General:        Right eye: No discharge.        Left eye: No discharge.  Cardiovascular:     Rate and Rhythm: Normal rate and regular rhythm.     Pulses:          Radial pulses are 2+ on the right side and 2+ on the left side.     Heart sounds: Normal heart sounds.  Pulmonary:     Effort: Pulmonary effort is normal.     Breath sounds: Normal breath sounds.  Chest:     Chest wall: No tenderness.  Abdominal:     Palpations: Abdomen is soft.     Tenderness: There is no abdominal tenderness.  Musculoskeletal:     Comments: No back tenderness  Skin:    General: Skin is warm and dry.  Neurological:     Mental Status: She is alert.  Psychiatric:        Mood and Affect: Mood is not anxious.     ED Results / Procedures / Treatments   Labs (all labs ordered are listed, but only abnormal results are displayed) Labs Reviewed  BASIC METABOLIC PANEL - Abnormal; Notable for the following components:      Result Value   Sodium 132 (*)    Potassium 2.9 (*)    Chloride 90 (*)    Glucose, Bld 361 (*)    BUN 24 (*)     Creatinine, Ser 1.12 (*)    GFR calc non Af Amer 60 (*)    All other components within normal limits  CBC - Abnormal; Notable for the following components:   RBC 5.47 (*)    MCV 75.0 (*)    MCH 22.7 (*)    RDW 18.3 (*)    All other components within normal limits  MAGNESIUM - Abnormal; Notable for the following components:   Magnesium 1.6 (*)    All other components within normal limits  TROPONIN I (HIGH SENSITIVITY) - Abnormal; Notable for the following components:   Troponin I (High Sensitivity) 25 (*)    All other components within normal limits  TROPONIN I (HIGH SENSITIVITY) - Abnormal; Notable for the following components:   Troponin I (High Sensitivity) 33 (*)    All other components within normal limits  PREGNANCY, URINE    EKG EKG Interpretation  Date/Time:  Wednesday July 04 2019 19:48:50 EST Ventricular Rate:  89 PR Interval:    QRS Duration: 103 QT Interval:  462 QTC Calculation: 563 R Axis:   77 Text Interpretation: Sinus rhythm Probable left ventricular hypertrophy Prolonged QT interval no significant change since Dec 2020 Confirmed by Sherwood Gambler (  [id number]) on 07/04/2019 7:51:44 PM   Radiology DG Chest 2 View  Result Date: 07/04/2019 CLINICAL DATA:  44 year old female with chest pain. EXAM: CHEST - 2 VIEW COMPARISON:  Chest radiograph dated 05/05/2019. FINDINGS: There is mild cardiomegaly. No vascular congestion or edema. No focal consolidation, pleural effusion, or pneumothorax. No acute osseous pathology. IMPRESSION: No acute cardiopulmonary process.  Cardiomegaly. Electronically Signed   By: Anner Crete M.D.   On: 07/04/2019 21:11   CT Angio Chest/Abd/Pel for Dissection W and/or Wo Contrast  Result Date: 07/04/2019 CLINICAL DATA:  Chest pain and back pain with concern for aortic dissection. EXAM: CT ANGIOGRAPHY CHEST, ABDOMEN AND PELVIS TECHNIQUE: Multidetector CT imaging through the chest, abdomen and pelvis was performed using the standard protocol  during bolus administration of intravenous contrast. Multiplanar reconstructed images and MIPs were obtained and reviewed to evaluate the vascular anatomy. CONTRAST:  137mL OMNIPAQUE IOHEXOL 350 MG/ML SOLN COMPARISON:  CT dated 02/06/2019. FINDINGS: CTA CHEST FINDINGS Cardiovascular: There is no evidence for thoracic aortic dissection or aneurysm. The heart size is mild-to-moderately enlarged. There is no large centrally located pulmonary embolism. Detection of smaller pulmonary emboli is limited by respiratory motion artifact and scanning technique. Mediastinum/Nodes: --No mediastinal or hilar lymphadenopathy. --No axillary lymphadenopathy. --No supraclavicular lymphadenopathy. --Normal thyroid gland. --The esophagus is unremarkable Lungs/Pleura: No pulmonary nodules or masses. No pleural effusion or pneumothorax. No focal airspace consolidation. No focal pleural abnormality. Musculoskeletal: No chest wall abnormality. No acute or significant osseous findings. Review of the MIP images confirms the above findings. CTA ABDOMEN AND PELVIS FINDINGS VASCULAR Aorta: Normal caliber aorta without aneurysm, dissection, vasculitis or significant stenosis. Celiac: Patent without evidence of aneurysm, dissection, vasculitis or significant stenosis. SMA: Patent without evidence of aneurysm, dissection, vasculitis or significant stenosis. Renals: Both renal arteries are patent without evidence of aneurysm, dissection, vasculitis, fibromuscular dysplasia or significant stenosis. IMA: Patent without evidence of aneurysm, dissection, vasculitis or significant stenosis. Inflow: Patent without evidence of aneurysm, dissection, vasculitis or significant stenosis. Veins: No obvious venous abnormality within the limitations of this arterial phase study. Review of the MIP images confirms the above findings. NON-VASCULAR Hepatobiliary: The liver is normal. Normal gallbladder.There is no biliary ductal dilation. Pancreas: Normal contours  without ductal dilatation. No peripancreatic fluid collection. Spleen: No splenic laceration or hematoma. Adrenals/Urinary Tract: --Adrenal glands: No adrenal hemorrhage. --Right kidney/ureter: No hydronephrosis or perinephric hematoma. --Left kidney/ureter: No hydronephrosis or perinephric hematoma. --Urinary bladder: Unremarkable. Stomach/Bowel: --Stomach/Duodenum: No hiatal hernia or other gastric abnormality. Normal duodenal course and caliber. --Small bowel: No dilatation or inflammation. --Colon: No focal abnormality. --Appendix: There is scattered colonic diverticula without CT evidence for diverticulitis. Lymphatic: --No retroperitoneal lymphadenopathy. --No mesenteric lymphadenopathy. --No pelvic or inguinal lymphadenopathy. Reproductive: Again identified is a large pelvic mass in the left hemipelvis measuring approximately 9 by 6.1 cm (previously measuring 9.1 by 6.1 cm. This mass abuts the uterus and urinary bladder. There is significant mass effect on the urinary bladder. Additional calcified and noncalcified fibroids are noted in the uterus. Other: No ascites or free air. The abdominal wall is normal. Musculoskeletal. No acute displaced fractures. Review of the MIP images confirms the above findings. IMPRESSION: 1. No acute findings in the chest, abdomen or pelvis. No evidence for dissection or acute pulmonary embolism. 2. Mild-to-moderate cardiomegaly. 3. Persistent left pelvic mass measuring approximately 9 cm. Again this is favored to represent an exophytic fibroid but is incompletely characterized on this study. Follow-up with a nonemergent outpatient contrast enhanced MRI is again recommended. Electronically Signed  By: Constance Holster M.D.   On: 07/04/2019 23:04    Procedures Procedures (including critical care time)  Medications Ordered in ED Medications  nitroGLYCERIN (NITROSTAT) SL tablet 0.4 mg (0.4 mg Sublingual Given 07/04/19 2014)  magnesium sulfate IVPB 2 g 50 mL (2 g  Intravenous New Bag/Given 07/04/19 2240)  aspirin chewable tablet 324 mg (324 mg Oral Given 07/04/19 2014)  potassium chloride SA (KLOR-CON) CR tablet 40 mEq (40 mEq Oral Given 07/04/19 2120)  potassium chloride 10 mEq in 100 mL IVPB (0 mEq Intravenous Stopped 07/04/19 2215)  acetaminophen (TYLENOL) tablet 650 mg (650 mg Oral Given 07/04/19 2125)  iohexol (OMNIPAQUE) 350 MG/ML injection 100 mL (100 mLs Intravenous Contrast Given 07/04/19 2232)    ED Course  I have reviewed the triage vital signs and the nursing notes.  Pertinent labs & imaging results that were available during my care of the patient were reviewed by me and considered in my medical decision making (see chart for details).    MDM Rules/Calculators/A&P                       Given the component of back and chest pain, CT scan ordered to rule out dissection.  This is negative.  She already knows about the pelvic mass.  Chest pain is much better.  Unclear cause of the chest pain.  She has 2 troponins that are technically abnormal but the rise is not really consistent with MI.  These are near her chronic levels from chart review.  While a technically did increase on the second, I had a discussion with her and it seems unlikely this is MI.  She would rather go home to be observed in the hospital which I think is pretty reasonable.  We discussed strict return precautions.  She is also noted to have a recurrent hypokalemia.  Will increase her potassium at home and add some magnesium. Final Clinical Impression(s) / ED Diagnoses Final diagnoses:  Nonspecific chest pain  Hypokalemia    Rx / DC Orders ED Discharge Orders    None       Sherwood Gambler, MD 07/04/19 2326

## 2019-07-04 NOTE — ED Triage Notes (Signed)
Pt states she was cooking when she started having chest pain that radiated to back and went up to her throat (approx 20 mins ago). Pt thought it might be indigestion and took a Protonix but pain did not go away. Pt took one SL NTG PTA. Still endorses chest pain rated 9/10 in middle of chest and middle of back.

## 2019-07-04 NOTE — Discharge Instructions (Addendum)
If you develop recurrent, continued, or worsening chest pain, shortness of breath, fever, vomiting, abdominal or back pain, or any other new/concerning symptoms then return to the ER for evaluation.   Your potassium is low.  You need to take increased potassium at home.  Instead of 2 tablets twice per day, you should take 2 tablets 3 times per day for the next 5 days.  Recheck with your doctor for repeat potassium and follow-up of your chest pain.

## 2019-07-04 NOTE — Telephone Encounter (Signed)
Noted  

## 2019-07-12 ENCOUNTER — Ambulatory Visit: Payer: Medicaid Other | Admitting: Obstetrics and Gynecology

## 2019-08-09 ENCOUNTER — Other Ambulatory Visit: Payer: Self-pay | Admitting: Cardiology

## 2019-08-28 ENCOUNTER — Emergency Department (HOSPITAL_COMMUNITY): Payer: Medicaid Other

## 2019-08-28 ENCOUNTER — Encounter (HOSPITAL_COMMUNITY): Payer: Self-pay

## 2019-08-28 ENCOUNTER — Emergency Department (HOSPITAL_COMMUNITY)
Admission: EM | Admit: 2019-08-28 | Discharge: 2019-08-28 | Disposition: A | Discharge status: Home / Self Care | Payer: Medicaid Other | Attending: Emergency Medicine | Admitting: Emergency Medicine

## 2019-08-28 ENCOUNTER — Other Ambulatory Visit: Payer: Self-pay

## 2019-08-28 DIAGNOSIS — R739 Hyperglycemia, unspecified: Secondary | ICD-10-CM

## 2019-08-28 DIAGNOSIS — I10 Essential (primary) hypertension: Secondary | ICD-10-CM

## 2019-08-28 DIAGNOSIS — Z79899 Other long term (current) drug therapy: Secondary | ICD-10-CM | POA: Diagnosis not present

## 2019-08-28 DIAGNOSIS — Z9114 Patient's other noncompliance with medication regimen: Secondary | ICD-10-CM

## 2019-08-28 DIAGNOSIS — M545 Low back pain, unspecified: Secondary | ICD-10-CM

## 2019-08-28 DIAGNOSIS — E1165 Type 2 diabetes mellitus with hyperglycemia: Secondary | ICD-10-CM | POA: Diagnosis not present

## 2019-08-28 DIAGNOSIS — Z794 Long term (current) use of insulin: Secondary | ICD-10-CM | POA: Insufficient documentation

## 2019-08-28 DIAGNOSIS — I5042 Chronic combined systolic (congestive) and diastolic (congestive) heart failure: Secondary | ICD-10-CM | POA: Diagnosis not present

## 2019-08-28 DIAGNOSIS — Z7982 Long term (current) use of aspirin: Secondary | ICD-10-CM | POA: Insufficient documentation

## 2019-08-28 DIAGNOSIS — I110 Hypertensive heart disease with heart failure: Secondary | ICD-10-CM | POA: Diagnosis not present

## 2019-08-28 DIAGNOSIS — D219 Benign neoplasm of connective and other soft tissue, unspecified: Secondary | ICD-10-CM | POA: Insufficient documentation

## 2019-08-28 LAB — URINALYSIS, ROUTINE W REFLEX MICROSCOPIC
Bacteria, UA: NONE SEEN
Bilirubin Urine: NEGATIVE
Glucose, UA: >=500 mg/dL — AB
Hgb urine dipstick: NEGATIVE
Ketones, ur: NEGATIVE mg/dL
Nitrite: NEGATIVE
Protein, ur: NEGATIVE mg/dL
Specific Gravity, Urine: 1.028 (ref 1.005–1.030)
pH: 6 (ref 5.0–8.0)

## 2019-08-28 LAB — BASIC METABOLIC PANEL
Anion gap: 9 (ref 5–15)
BUN: 15 mg/dL (ref 6–20)
CO2: 27 mmol/L (ref 22–32)
Calcium: 9.3 mg/dL (ref 8.9–10.3)
Chloride: 97 mmol/L — ABNORMAL LOW (ref 98–111)
Creatinine, Ser: 0.93 mg/dL (ref 0.44–1.00)
GFR calc Af Amer: >60 mL/min (ref >60)
GFR calc non Af Amer: >60 mL/min (ref >60)
Glucose, Bld: 374 mg/dL — ABNORMAL HIGH (ref 70–99)
Potassium: 5.4 mmol/L — ABNORMAL HIGH (ref 3.5–5.1)
Sodium: 133 mmol/L — ABNORMAL LOW (ref 135–145)

## 2019-08-28 LAB — CBC WITH DIFFERENTIAL/PLATELET
Abs Immature Granulocytes: 0.05 10*3/uL (ref 0.00–0.07)
Basophils Absolute: 0.1 10*3/uL (ref 0.0–0.1)
Basophils Relative: 1 %
Eosinophils Absolute: 0.1 10*3/uL (ref 0.0–0.5)
Eosinophils Relative: 1 %
HCT: 34.6 % — ABNORMAL LOW (ref 36.0–46.0)
Hemoglobin: 10.5 g/dL — ABNORMAL LOW (ref 12.0–15.0)
Immature Granulocytes: 1 %
Lymphocytes Relative: 19 %
Lymphs Abs: 2 10*3/uL (ref 0.7–4.0)
MCH: 23.5 pg — ABNORMAL LOW (ref 26.0–34.0)
MCHC: 30.3 g/dL (ref 30.0–36.0)
MCV: 77.6 fL — ABNORMAL LOW (ref 80.0–100.0)
Monocytes Absolute: 0.4 10*3/uL (ref 0.1–1.0)
Monocytes Relative: 4 %
Neutro Abs: 7.8 10*3/uL — ABNORMAL HIGH (ref 1.7–7.7)
Neutrophils Relative %: 74 %
Platelets: 274 10*3/uL (ref 150–400)
RBC: 4.46 MIL/uL (ref 3.87–5.11)
RDW: 15.9 % — ABNORMAL HIGH (ref 11.5–15.5)
WBC: 10.4 10*3/uL (ref 4.0–10.5)
nRBC: 0 % (ref 0.0–0.2)

## 2019-08-28 LAB — POC URINE PREG, ED: Preg Test, Ur: NEGATIVE

## 2019-08-28 LAB — CBG MONITORING, ED
Glucose-Capillary: 328 mg/dL — ABNORMAL HIGH (ref 70–99)
Glucose-Capillary: 357 mg/dL — ABNORMAL HIGH (ref 70–99)

## 2019-08-28 MED ORDER — GADOBUTROL 1 MMOL/ML IV SOLN
10.0000 mL | Freq: Once | INTRAVENOUS | Status: AC | PRN
  Administered 2019-08-28: 10 mL via INTRAVENOUS

## 2019-08-28 MED ORDER — MORPHINE SULFATE (PF) 4 MG/ML IV SOLN
4.0000 mg | Freq: Once | INTRAVENOUS | Status: AC
  Administered 2019-08-28: 4 mg via INTRAVENOUS
  Filled 2019-08-28: qty 1

## 2019-08-28 MED ORDER — INSULIN ASPART 100 UNIT/ML ~~LOC~~ SOLN
8.0000 [IU] | Freq: Once | SUBCUTANEOUS | Status: AC
  Administered 2019-08-28: 8 [IU] via SUBCUTANEOUS
  Filled 2019-08-28: qty 1

## 2019-08-28 MED ORDER — OXYCODONE-ACETAMINOPHEN 5-325 MG PO TABS: 1.0000 | ORAL_TABLET | Freq: Four times a day (QID) | ORAL | 0 refills | Status: DC | PRN

## 2019-08-28 MED ORDER — ONDANSETRON HCL 4 MG/2ML IJ SOLN
4.0000 mg | Freq: Once | INTRAMUSCULAR | Status: AC
  Administered 2019-08-28: 4 mg via INTRAVENOUS
  Filled 2019-08-28: qty 2

## 2019-08-28 MED ORDER — ONDANSETRON 4 MG PO TBDP
4.0000 mg | ORAL_TABLET | Freq: Once | ORAL | Status: DC
  Filled 2019-08-28: qty 1

## 2019-08-28 MED ORDER — HYDROMORPHONE HCL 1 MG/ML IJ SOLN
1.0000 mg | Freq: Once | INTRAMUSCULAR | Status: AC
  Administered 2019-08-28: 1 mg via INTRAVENOUS
  Filled 2019-08-28: qty 1

## 2019-08-28 MED ORDER — LABETALOL HCL 5 MG/ML IV SOLN
20.0000 mg | Freq: Once | INTRAVENOUS | Status: AC
  Administered 2019-08-28: 20 mg via INTRAVENOUS
  Filled 2019-08-28: qty 4

## 2019-08-28 NOTE — ED Notes (Signed)
Pt called out for food and was given crackers and peanut butter, Pt stated "I want real food not that".

## 2019-08-28 NOTE — Discharge Instructions (Addendum)
As discussed, I suspect you may have a component of musculoskeletal back pain, however your pain is probably also secondary to the large fibroids.  I recommend a conversation with Dr. Glo Herring to further evaluate whether surgery or other treatment for these fibroids would be helpful.  Your blood pressure and your blood glucose levels have been elevated significantly today.  It is very important that you take your medications as prescribed by your primary doctor.  You should be making wise dietary choices to help minimize your hyperglycemia.  You have been prescribed a small quantity of pain medicine to help you with your symptoms until you are able to get in with Dr. Glo Herring.  Do not drive within 4 hours of taking this prescription medication as this will make you drowsy.

## 2019-08-28 NOTE — ED Triage Notes (Signed)
Pt reports pain in lower back for the past couple of weeks.  Reports she was on her period last week and thought the pain may have been related.  Denies injury.

## 2019-08-29 NOTE — ED Provider Notes (Signed)
[facility] EMERGENCY DEPARTMENT Provider Note   CSN: [id number] Arrival date & time: 08/28/19  [id number]     History Chief Complaint  Patient presents with  . Back Pain    Chelsey Santiago is a 44 y.o. female with a history of DM, HTN, CAD with hx of MI and known uterine fibroids presenting with low back pain which has been constantly present for the past few weeks.  She reports worse pain during her period, describing deep aching pain low pelvis radiating to her back.  She denies back injury or falls, no heavy lifting or overuse, also no radiation of pain into the legs.  Her pain is constant but worse with walking and movement.  No fevers or chills, no dysuria, urinary or fecal incontinence, no hematuria or vaginal complaints.  She has found no alleviators for this pain.    Per review of the chart, she was seen here last fall and CT imaging confirmed large uterine fibroids and concern for left ovarian mass.  She was seen by Dr. Glo Herring and had f/u US with results c/w probable large fibroid.  It was recommended that she have a pelvic MRI to confirm this.  She did not follow up so was not aware of this recommendation.    The history is provided by the patient.       Past Medical History:  Diagnosis Date  . Anemia    H&H of 10.6/33 and 07/2008 and 11.9/35 and 09/2010  . Anxiety   . Chronic combined systolic and diastolic CHF (congestive heart failure) ([place])   . Depression with anxiety   . Diabetes mellitus without complication ([place])   . Hypertension   . Hypertensive heart disease 2009   Pulmonary edema postpartum; mild to moderate mitral regurgitation when hospitalized for CHF in 2009; Echocardiogram in 12/2009-no MR and normal EF; normal CXR in 09/2010  . Migraine headache   . Miscarriage 03/19/2013  . Obesity 04/16/2009  . Osteoarthritis, knee 03/29/2011  . Preeclampsia   . Pulmonary edema   . Sleep apnea   . Threatened abortion in early pregnancy 03/15/2013    Patient Active  Problem List   Diagnosis Date Noted  . Abdominal pain, epigastric 03/14/2019  . Dysphagia 03/14/2019  . Gastroesophageal reflux disease 03/14/2019  . Class 2 obesity   . Acute exacerbation of CHF (congestive heart failure) ([place]) 10/26/2018  . Chronic combined systolic and diastolic CHF (congestive heart failure) ([place]) 06/02/2018  . Uncontrolled type 2 diabetes mellitus with hyperglycemia ([place]) 06/02/2018  . Influenza B 05/13/2018  . Hypomagnesemia 05/12/2018  . Headache 05/12/2018  . Upper respiratory tract infection   . HCAP (healthcare-associated pneumonia)   . Constipation 10/17/2017  . Bad headache   . CHF exacerbation ([place]) 09/29/2017  . Iron deficiency anemia 05/16/2017  . Vitamin D deficiency 11/25/2016  . Iron deficiency 11/25/2016  . History of acute myocardial infarction 10/05/2016  . CHF (congestive heart failure) ([place]) 05/06/2016  . Diabetes mellitus with complication ([place]) [id number]  . Leukocytosis 05/06/2016  . Neuropathy 05/06/2016  . Depression 04/16/2016  . Chronic tension-type headache, not intractable 04/16/2016  . AKI (acute kidney injury) ([place])   . Hyperkalemia   . Nonischemic cardiomyopathy ([place])   . Acute on chronic combined systolic and diastolic ACC/AHA stage C congestive heart failure ([place]) 04/03/2016  . Acute on chronic combined systolic and diastolic CHF, NYHA class 4 ([place]) 04/03/2016  . Hypertensive emergency 04/03/2016  . Cardiomyopathy due to hypertension ([place]) 12/22/2015  . Normal coronary  arteries 12/22/2015  . Troponin level elevated 12/22/2015  . NSTEMI (non-ST elevated myocardial infarction) ([place]) 12/20/2015  . Dental infection 10/10/2015  . Chest pain 09/05/2015  . Systolic CHF, chronic ([place]) 09/05/2015  . LLQ pain   . Type 2 diabetes mellitus without complication ([place]) [id number]  . Essential hypertension   . 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 Hypertensive urgency 04/22/2014  . Acute CHF ([place]) 04/22/2014  . S/P cesarean section  04/11/2014  . Acute pulmonary edema ([place]) 04/11/2014  . Postoperative anemia 04/11/2014  . Elevated serum creatinine 04/11/2014  . Preeclampsia, severe 04/09/2014  . Pre-eclampsia superimposed on chronic hypertension, antepartum 04/08/2014  . Dyspnea 04/08/2014  . Polyhydramnios in third trimester, antepartum 03/14/2014  . Abnormal maternal glucose tolerance, antepartum 03/11/2014  . High-risk pregnancy 03/11/2014  . Pre-existing essential hypertension complicating pregnancy 0000000  . Impaired glucose tolerance during pregnancy, antepartum 11/27/2013  . Leiomyoma of uterus 11/22/2013  . History of gestational diabetes in prior pregnancy, currently pregnant in first trimester 11/22/2013  . Hx of preeclampsia, prior pregnancy, currently pregnant 11/22/2013  . Short interval between pregnancies affecting pregnancy, antepartum 11/22/2013  . Supervision of high-risk pregnancy of elderly primigravida (>= 64 years old at delivery), third trimester 11/22/2013  . Miscarriage 03/19/2013  . Major depressive disorder, single episode, unspecified 09/27/2011  . Hypertension   . Hypertensive cardiovascular disease   . Microcytic anemia   . Osteoarthrosis involving lower leg 03/29/2011  . Hypokalemia 12/12/2009  . OSA on CPAP 12/09/2009  . Morbid obesity ([place]) 04/16/2009    Past Surgical History:  Procedure Laterality Date  . BREAST REDUCTION SURGERY  2002  . CARDIAC CATHETERIZATION N/A 12/22/2015   Procedure: Left Heart Cath and Coronary Angiography;  Surgeon: Peter M Martinique, MD;  Location: [facility];  Service: Cardiovascular;  Laterality: N/A;  . CESAREAN SECTION N/A 04/09/2014   Procedure: CESAREAN SECTION;  Surgeon: Mora Bellman, MD;  Location: [facility];  Service: Obstetrics;  Laterality: N/A;  . CHOLECYSTECTOMY       OB History    Gravida  11   Para  6   Term  5   Preterm  1   AB  5   Living  6     SAB  3   TAB  2   Ectopic      Multiple  0   Live Births    6           Family History  Problem Relation Age of Onset  . Diabetes Mother 28  . Heart disease Mother   . Hyperlipidemia Paternal Grandfather   . Hypertension Paternal Grandfather   . Heart disease Father   . Hypertension Father   . Heart disease Maternal Grandmother 60  . ADD / ADHD Son   . Hypertension Maternal Uncle   . Heart attack Brother   . Sudden death Neg Hx   . Colon cancer Neg Hx   . Celiac disease Neg Hx   . Inflammatory bowel disease Neg Hx     Social History   Tobacco Use  . Smoking status: Never Smoker  . Smokeless tobacco: Never Used  Substance Use Topics  . Alcohol use: Yes    Comment: occ  . Drug use: No    Home Medications Prior to Admission medications   Medication Sig Start Date End Date Taking? Authorizing Provider  albuterol (PROVENTIL HFA;VENTOLIN HFA) 108 (90 Base) MCG/ACT inhaler Inhale 1 puff into the lungs every 6 (six) hours as needed for  wheezing or shortness of breath.   Yes [provider]  amLODipine (NORVASC) 5 MG tablet Take 1 tablet (5 mg total) by mouth daily. 06/04/19 09/02/19 Yes Dunn, Dayna N, PA-C  ARIPiprazole (ABILIFY) 10 MG tablet Take 1 tablet (10 mg total) by mouth daily. 10/30/18  Yes Barton Dubois, MD  aspirin EC 81 MG tablet Take 81 mg by mouth daily.   Yes [provider]  atorvastatin (LIPITOR) 20 MG tablet Take 1 tablet (20 mg total) by mouth daily at 6 PM. 06/03/18  Yes Tat, Shanon Brow, MD  budesonide-formoterol (SYMBICORT) 80-4.5 MCG/ACT inhaler Inhale 2 puffs into the lungs 2 (two) times daily. 05/04/18  Yes Shah, Pratik D, DO  carvedilol (COREG) 25 MG tablet TAKE 2 TABLETS BY MOUTH TWICE DAILY WITH A MEAL. 08/09/19  Yes Branch, Alphonse Guild, MD  clonazePAM (KLONOPIN) 0.5 MG tablet Take 0.5 mg by mouth 2 (two) times daily. 12/19/18  Yes [provider]  gabapentin (NEURONTIN) 600 MG tablet Take 600 mg by mouth 3 (three) times daily.    Yes [provider]  hydrALAZINE (APRESOLINE) 100 MG  tablet Take 1 tablet (100 mg total) by mouth 3 (three) times daily. 06/03/18  Yes Tat, Shanon Brow, MD  insulin glargine (LANTUS) 100 UNIT/ML injection Inject 30 Units at bedtime into the skin.    Yes [provider]  insulin lispro (HUMALOG) 100 UNIT/ML injection Inject 15 Units into the skin 3 (three) times daily before meals.    Yes [provider]  Ipratropium-Albuterol (COMBIVENT RESPIMAT) 20-100 MCG/ACT AERS respimat Inhale 1 puff into the lungs every 6 (six) hours as needed for wheezing or shortness of breath. 05/04/18  Yes Manuella Ghazi, Pratik D, DO  linaclotide [facility]) 145 MCG CAPS capsule Take 1 capsule (145 mcg total) by mouth daily before breakfast. 03/14/19  Yes Erenest Rasher, PA-C  Magnesium 400 MG CAPS Take 400 mg by mouth 2 (two) times daily. 06/05/19  Yes Dunn, Dayna N, PA-C  metFORMIN (GLUCOPHAGE) 500 MG tablet Take 500 mg 2 (two) times daily with a meal by mouth.   Yes [provider]  methocarbamol (ROBAXIN) 500 MG tablet Take 1 tablet (500 mg total) by mouth every 8 (eight) hours as needed for muscle spasms. 01/02/19  Yes Davonna Belling, MD  nitroGLYCERIN (NITROSTAT) 0.4 MG SL tablet Place 1 tablet (0.4 mg total) under the tongue every 5 (five) minutes as needed for chest pain. 09/05/15  Yes Kathie Dike, MD  ondansetron (ZOFRAN) 4 MG tablet Take 4 mg by mouth daily as needed for nausea or vomiting.   Yes [provider]  pantoprazole (PROTONIX) 40 MG tablet Take 1 tablet (40 mg total) by mouth 2 (two) times daily before a meal. 03/14/19  Yes Jodi Mourning, Kristen S, PA-C  potassium chloride SA (KLOR-CON) 20 MEQ tablet Take 2 tablets (40 mEq total) by mouth 2 (two) times daily. 06/05/19  Yes Dunn, Nedra Hai, PA-C  promethazine (PHENERGAN) 25 MG tablet Take 1 tablet (25 mg total) by mouth every 6 (six) hours as needed for nausea or vomiting. 02/06/19  Yes Dexter Sauser, Almyra Free, PA-C  pyridostigmine (MESTINON) 60 MG tablet Take 60 mg by mouth every 8 (eight) hours.   Yes  [provider]  sacubitril-valsartan (ENTRESTO) 97-103 MG Take 1 tablet by mouth 2 (two) times daily. 03/16/19  Yes BranchAlphonse Guild, MD  torsemide (DEMADEX) 20 MG tablet Take 2 tablets (40 mg total) by mouth 2 (two) times daily. 06/04/19 09/02/19 Yes Dunn, Dayna N, PA-C  traZODone (  DESYREL) 100 MG tablet Take 2 tablets (200 mg total) by mouth at bedtime. 10/29/18  Yes Barton Dubois, MD  oxyCODONE-acetaminophen (PERCOCET/ROXICET) 5-325 MG tablet Take 1 tablet by mouth every 6 (six) hours as needed. 08/28/19   Evalee Jefferson, PA-C    Allergies    Diclofenac, Tramadol, Vicodin [hydrocodone-acetaminophen], and Hydrocodone bitartrate er  Review of Systems   Review of Systems  Constitutional: Negative for chills and fever.  HENT: Negative for congestion and sore throat.   Eyes: Negative.  Negative for visual disturbance.  Respiratory: Negative for chest tightness and shortness of breath.   Cardiovascular: Negative for chest pain.  Gastrointestinal: Negative for abdominal pain, nausea and vomiting.  Genitourinary: Positive for pelvic pain. Negative for dysuria, vaginal discharge and vaginal pain.  Musculoskeletal: Positive for back pain. Negative for arthralgias, joint swelling and neck pain.  Skin: Negative.  Negative for rash and wound.  Neurological: Negative for dizziness, weakness, light-headedness, numbness and headaches.  Psychiatric/Behavioral: Negative.     Physical Exam Updated Vital Signs BP (!) 169/107 (BP Location: Left Arm)   Pulse 86   Temp (!) 97.5 F (36.4 C) (Oral)   Resp 18   Ht 5\' 6"  (1.676 m)   Wt 104.3 kg   LMP 08/21/2019   SpO2 95%   BMI 37.12 kg/m   Physical Exam Vitals and nursing note reviewed.  Constitutional:      Appearance: She is well-developed.  HENT:     Head: Normocephalic and atraumatic.  Eyes:     Conjunctiva/sclera: Conjunctivae normal.  Cardiovascular:     Rate and Rhythm: Normal rate and regular rhythm.     Heart sounds: Normal  heart sounds.     Comments: Pedal pulses normal. Pulmonary:     Effort: Pulmonary effort is normal.     Breath sounds: Normal breath sounds. No wheezing.  Abdominal:     General: Abdomen is protuberant. Bowel sounds are normal. There is no distension.     Palpations: Abdomen is soft. There is no mass.     Tenderness: There is abdominal tenderness in the suprapubic area and left lower quadrant. There is no guarding or rebound.  Musculoskeletal:        General: Normal range of motion.     Cervical back: Normal range of motion and neck supple.     Lumbar back: Tenderness present. No swelling, edema, spasms or bony tenderness.     Comments: paralumbar ttp.  Skin:    General: Skin is warm and dry.  Neurological:     Mental Status: She is alert.     Sensory: No sensory deficit.     Motor: No weakness.     Gait: Gait normal.     Deep Tendon Reflexes:     Reflex Scores:      Patellar reflexes are 2+ on the right side and 2+ on the left side.    Comments: No strength deficit noted in hip and knee flexor and extensor muscle groups.  Ankle flexion and extension intact. Pt ambulatory without foot drop, no antalgia.      ED Results / Procedures / Treatments   Labs (all labs ordered are listed, but only abnormal results are displayed) Labs Reviewed  URINALYSIS, ROUTINE W REFLEX MICROSCOPIC - Abnormal; Notable for the following components:      Result Value   Glucose, UA >=500 (*)    Leukocytes,Ua TRACE (*)    All other components within normal limits  CBC WITH DIFFERENTIAL/PLATELET - Abnormal; Notable  for the following components:   Hemoglobin 10.5 (*)    HCT 34.6 (*)    MCV 77.6 (*)    MCH 23.5 (*)    RDW 15.9 (*)    Neutro Abs 7.8 (*)    All other components within normal limits  BASIC METABOLIC PANEL - Abnormal; Notable for the following components:   Sodium 133 (*)    Potassium 5.4 (*)    Chloride 97 (*)    Glucose, Bld 374 (*)    All other components within normal limits    CBG MONITORING, ED - Abnormal; Notable for the following components:   Glucose-Capillary 328 (*)    All other components within normal limits  CBG MONITORING, ED - Abnormal; Notable for the following components:   Glucose-Capillary 357 (*)    All other components within normal limits  POC URINE PREG, ED    EKG None  Radiology MR PELVIS W WO CONTRAST  Result Date: 08/28/2019 CLINICAL DATA:  Evaluate pelvic mass. EXAM: MRI PELVIS WITHOUT AND WITH CONTRAST TECHNIQUE: Multiplanar multisequence MR imaging of the pelvis was performed both before and after administration of intravenous contrast. CONTRAST:  61mL GADAVIST GADOBUTROL 1 MMOL/ML IV SOLN COMPARISON:  CT dated 07/04/2019 and ultrasound pelvis 02/16/2019 FINDINGS: Urinary Tract: No focal bladder abnormality. Enlarged uterus has mass effect upon the bladder which is deviated into the right hemipelvis. Bowel:  Unremarkable visualized pelvic bowel loops. Vascular/Lymphatic: No pathologically enlarged lymph nodes. No significant vascular abnormality seen. Reproductive: Uterus: Measures 16.2 x 8.0 x 7.9 cm (volume = 540 cm^3). The uterus has a diffusely heterogeneous appearance with multiple small enhancing lesions which are felt to represent fibroids. -Intramural fibroid within the uterine fundus measures 1.6 cm, image, image 13/8. -Anterior fundal fibroid is identified measuring 2.3 cm, image 16/8. -Calcified sub mucosal fibroid within the left side of fundus measures 0.9 cm, image 14/8. -Large, well-circumscribed mass within the left side pelvis is identified and felt to represent a large exophytic, subserosal fibroid. This measures 8.9 x 6.5 by 7.3 cm (volume = 220 cm^3), image 20/8. There is diffuse internal enhancement within this structure. On 02/06/2019 this mass measured 9.5 x 6.4 by 7.6 cm (volume = 240 cm^3) Endometrium: The endometrium measures 6 mm in thickness, image 16/3. There is a left sided 0.9 x 1.0 cm calcified sub mucosal fibroid.  Right ovary measures 3.1 x 2.4 by 3.2 cm. No adnexal mass. Left ovary measures 2.9 x 2.5 by 3.9 cm. No adnexal mass. identified. Other:  None. Musculoskeletal: No suspicious bone lesions identified. IMPRESSION: 1. Enlarged uterus with multiple fibroids. 2. Large, well-circumscribed mass within the left side of the pelvis is identified measuring 8.9 x 6.5 x 7.3 cm. There is diffuse internal enhancement within this structure. Favored to represent a large subserosal, exophytic fibroid. This is not significantly changed in size when compared with 02/06/2019. 3. The endometrium measures 6 mm in thickness with a small left sided calcified sub mucosal fibroid. Electronically Signed   By: Kerby Moors M.D.   On: 08/28/2019 12:55    Procedures Procedures (including critical care time)  Medications Ordered in ED Medications  ondansetron (ZOFRAN) injection 4 mg (4 mg Intravenous Given 08/28/19 1112)  morphine 4 MG/ML injection 4 mg (4 mg Intravenous Given 08/28/19 1117)  gadobutrol (GADAVIST) 1 MMOL/ML injection 10 mL (10 mLs Intravenous Contrast Given 08/28/19 1208)  insulin aspart (novoLOG) injection 8 Units (8 Units Subcutaneous Given 08/28/19 1303)  labetalol (NORMODYNE) injection 20 mg (20 mg Intravenous Given  08/28/19 1304)  HYDROmorphone (DILAUDID) injection 1 mg (1 mg Intravenous Given 08/28/19 1451)    ED Course  I have reviewed the triage vital signs and the nursing notes.  Pertinent labs & imaging results that were available during my care of the patient were reviewed by me and considered in my medical decision making (see chart for details).    MDM Rules/Calculators/A&P                      Labs and imaging reviewed and discussed with pt. She initially was very hypertensive upon arrival, was given IV labetalol with improvement but remained hypertensive while here.  She has no signs of end organ damage and endorses she has not taken her home medications today, including her insulin.  Her cbg  also very high.  It started to improve after giving an insulin injection, unfortunately she was found eating candy bars in her room during her tx.  Discussed need to make better food choices and that her DM will remain out of control without this action. Pt stated she was hungry and we were not feeding her.  Advised pt it is not our policy to provide food, especially with elevated glucose levels. Also advised to take her home medications as prescribed by her pcp.  MR reviewed, reassuring for fibroids, with one large left pelvic fibroid which may be the source of pain, although could also be musculoskeletal in origin.  Advised f/u with Dr. Glo Herring to discuss tx/surgery as possible solution to her chronic sx.  She has no red flag sx for cauda equina or surgical lumbar/neuro problem.    The patient appears reasonably screened and/or stabilized for discharge and I doubt any other medical condition or other [facility] requiring further screening, evaluation, or treatment in the ED at this time prior to discharge.       Final Clinical Impression(s) / ED Diagnoses Final diagnoses:  Acute midline low back pain without sciatica  Fibroids  Hyperglycemia without ketosis  Hypertension, unspecified type  Non compliance w medication regimen    Rx / DC Orders ED Discharge Orders         Ordered    oxyCODONE-acetaminophen (PERCOCET/ROXICET) 5-325 MG tablet  Every 6 hours PRN     08/28/19 1510           Evalee Jefferson, PA-C 08/30/19 1252    Isla Pence, MD 08/30/19 1521

## 2019-08-30 ENCOUNTER — Ambulatory Visit (INDEPENDENT_AMBULATORY_CARE_PROVIDER_SITE_OTHER): Payer: Medicaid Other | Admitting: Obstetrics & Gynecology

## 2019-08-30 ENCOUNTER — Other Ambulatory Visit: Payer: Self-pay

## 2019-08-30 ENCOUNTER — Encounter: Payer: Self-pay | Admitting: Obstetrics & Gynecology

## 2019-08-30 VITALS — BP 198/126 | HR 92 | Ht 66.0 in | Wt 232.0 lb

## 2019-08-30 DIAGNOSIS — D219 Benign neoplasm of connective and other soft tissue, unspecified: Secondary | ICD-10-CM

## 2019-08-30 MED ORDER — ONDANSETRON 8 MG PO TBDP: 8.0000 mg | ORAL_TABLET | Freq: Three times a day (TID) | ORAL | 0 refills | Status: DC | PRN

## 2019-08-30 MED ORDER — KETOROLAC TROMETHAMINE 10 MG PO TABS: 10.0000 mg | ORAL_TABLET | Freq: Three times a day (TID) | ORAL | 0 refills | Status: DC | PRN

## 2019-08-30 NOTE — Progress Notes (Signed)
Chief Complaint  Patient presents with  . Follow-up    ER/ovary pain/ MRI done      44 y.o. [id number] Patient's last menstrual period was 08/21/2019. The current method of family planning is tubal ligation.  Outpatient Encounter Medications as of 08/30/2019  Medication Sig  . albuterol (PROVENTIL HFA;VENTOLIN HFA) 108 (90 Base) MCG/ACT inhaler Inhale 1 puff into the lungs every 6 (six) hours as needed for wheezing or shortness of breath.  Marland Kitchen amLODipine (NORVASC) 5 MG tablet Take 1 tablet (5 mg total) by mouth daily.  . ARIPiprazole (ABILIFY) 10 MG tablet Take 1 tablet (10 mg total) by mouth daily.  Marland Kitchen aspirin EC 81 MG tablet Take 81 mg by mouth daily.  Marland Kitchen atorvastatin (LIPITOR) 20 MG tablet Take 1 tablet (20 mg total) by mouth daily at 6 PM.  . budesonide-formoterol (SYMBICORT) 80-4.5 MCG/ACT inhaler Inhale 2 puffs into the lungs 2 (two) times daily.  . carvedilol (COREG) 25 MG tablet TAKE 2 TABLETS BY MOUTH TWICE DAILY WITH A MEAL.  . clonazePAM (KLONOPIN) 0.5 MG tablet Take 0.5 mg by mouth 2 (two) times daily.  Marland Kitchen gabapentin (NEURONTIN) 600 MG tablet Take 600 mg by mouth 3 (three) times daily.   . hydrALAZINE (APRESOLINE) 100 MG tablet Take 1 tablet (100 mg total) by mouth 3 (three) times daily.  . insulin glargine (LANTUS) 100 UNIT/ML injection Inject 30 Units at bedtime into the skin.   Marland Kitchen insulin lispro (HUMALOG) 100 UNIT/ML injection Inject 15 Units into the skin 3 (three) times daily before meals.   . Ipratropium-Albuterol (COMBIVENT RESPIMAT) 20-100 MCG/ACT AERS respimat Inhale 1 puff into the lungs every 6 (six) hours as needed for wheezing or shortness of breath.  . linaclotide (LINZESS) 145 MCG CAPS capsule Take 1 capsule (145 mcg total) by mouth daily before breakfast.  . Magnesium 400 MG CAPS Take 400 mg by mouth 2 (two) times daily.  . metFORMIN (GLUCOPHAGE) 500 MG tablet Take 500 mg 2 (two) times daily with a meal by mouth.  . methocarbamol (ROBAXIN) 500 MG tablet Take 1  tablet (500 mg total) by mouth every 8 (eight) hours as needed for muscle spasms.  . nitroGLYCERIN (NITROSTAT) 0.4 MG SL tablet Place 1 tablet (0.4 mg total) under the tongue every 5 (five) minutes as needed for chest pain.  Marland Kitchen ondansetron (ZOFRAN) 4 MG tablet Take 4 mg by mouth daily as needed for nausea or vomiting.  Marland Kitchen oxyCODONE-acetaminophen (PERCOCET/ROXICET) 5-325 MG tablet Take 1 tablet by mouth every 6 (six) hours as needed.  . pantoprazole (PROTONIX) 40 MG tablet Take 1 tablet (40 mg total) by mouth 2 (two) times daily before a meal.  . potassium chloride SA (KLOR-CON) 20 MEQ tablet Take 2 tablets (40 mEq total) by mouth 2 (two) times daily.  . promethazine (PHENERGAN) 25 MG tablet Take 1 tablet (25 mg total) by mouth every 6 (six) hours as needed for nausea or vomiting.  . pyridostigmine (MESTINON) 60 MG tablet Take 60 mg by mouth every 8 (eight) hours.  . sacubitril-valsartan (ENTRESTO) 97-103 MG Take 1 tablet by mouth 2 (two) times daily.  Marland Kitchen torsemide (DEMADEX) 20 MG tablet Take 2 tablets (40 mg total) by mouth 2 (two) times daily.  . traZODone (DESYREL) 100 MG tablet Take 2 tablets (200 mg total) by mouth at bedtime.  Marland Kitchen ketorolac (TORADOL) 10 MG tablet Take 1 tablet (10 mg total) by mouth every 8 (eight) hours as needed.  . ondansetron (ZOFRAN ODT) 8  MG disintegrating tablet Take 1 tablet (8 mg total) by mouth every 8 (eight) hours as needed for nausea or vomiting.   No facility-administered encounter medications on file as of 08/30/2019.    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 Anemia    H&H of 10.6/33 and 07/2008 and 11.9/35 and 09/2010  . Anxiety   . Chronic combined systolic and diastolic CHF (congestive heart failure) ([place])   . Depression with anxiety   . Diabetes  mellitus without complication ([place])   . Hypertension   . Hypertensive heart disease 2009   Pulmonary edema postpartum; mild to moderate mitral regurgitation when hospitalized for CHF in 2009; Echocardiogram in 12/2009-no MR and normal EF; normal CXR in 09/2010  . Migraine headache   . Miscarriage 03/19/2013  . Obesity 04/16/2009  . Osteoarthritis, knee 03/29/2011  . Preeclampsia   . Pulmonary edema   . Sleep apnea   . Threatened abortion in early pregnancy 03/15/2013    Past Surgical History:  Procedure Laterality Date  . BREAST REDUCTION SURGERY  2002  . CARDIAC CATHETERIZATION N/A 12/22/2015   Procedure: Left Heart Cath and Coronary Angiography;  Surgeon: Peter M Martinique, MD;  Location: [facility];  Service: Cardiovascular;  Laterality: N/A;  . CESAREAN SECTION N/A 04/09/2014   Procedure: CESAREAN SECTION;  Surgeon: Mora Bellman, MD;  Location: [facility];  Service: Obstetrics;  Laterality: N/A;  . CHOLECYSTECTOMY      OB History    Gravida  11   Para  6   Term  5   Preterm  1   AB  5   Living  6     SAB  3   TAB  2   Ectopic      Multiple  0   Live Births  6           Allergies  Allergen Reactions  . Diclofenac Swelling    AND POSSIBLE SYNCOPE; tolerates ibuprofen per pt  . Tramadol Nausea And Vomiting and Nausea Only    Itching (12/21); tolerates ibuprofen per pt  . Vicodin [Hydrocodone-Acetaminophen] Itching and Nausea Only  . Hydrocodone Bitartrate Er Itching    Social History   Socioeconomic History  . Marital status: Married    Spouse name: Not on file  . Number of children: 6  . Years of education: Not on file  . Highest education level: Not on file  Occupational History  . Occupation: unemployed    Fish farm manager: UNEMPLOYED  Tobacco Use  . Smoking status: Never Smoker  . Smokeless tobacco: Never Used  Substance and Sexual Activity  . Alcohol use: Yes    Comment: occ  . Drug use: No  . Sexual activity: Yes    Birth  control/protection: Surgical    Comment: tubal  Other Topics Concern  . Not on file  Social History Narrative   Lives in [place]   Engaged/boyfriend (father to youngest chid)   4 children: daughter (56 as of 2013), sons (15, 22, 25 as of 2013)   Religion: christian   Social Determinants of Radio broadcast assistant Strain:   . Difficulty of Paying Living Expenses:   Food Insecurity:   . Worried About Charity fundraiser in the Last Year:   . [employer] of  Food in the Last Year:   Transportation Needs:   . Film/video editor (Medical):   Marland Kitchen Lack of Transportation (Non-Medical):   Physical Activity:   . Days of Exercise per Week:   . Minutes of Exercise per Session:   Stress:   . Feeling of Stress :   Social Connections:   . Frequency of Communication with Friends and Family:   . Frequency of Social Gatherings with Friends and Family:   . Attends Religious Services:   . Active Member of Clubs or Organizations:   . Attends Archivist Meetings:   Marland Kitchen Marital Status:     Family History  Problem Relation Age of Onset  . Diabetes Mother 13  . Heart disease Mother   . Hyperlipidemia Paternal Grandfather   . Hypertension Paternal Grandfather   . Heart disease Father   . Hypertension Father   . Heart disease Maternal Grandmother 60  . ADD / ADHD Son   . Hypertension Maternal Uncle   . Heart attack Brother   . Sudden death Neg Hx   . Colon cancer Neg Hx   . Celiac disease Neg Hx   . Inflammatory bowel disease Neg Hx     Medications:       Current Outpatient Medications:  .  albuterol (PROVENTIL HFA;VENTOLIN HFA) 108 (90 Base) MCG/ACT inhaler, Inhale 1 puff into the lungs every 6 (six) hours as needed for wheezing or shortness of breath., Disp: , Rfl:  .  amLODipine (NORVASC) 5 MG tablet, Take 1 tablet (5 mg total) by mouth daily., Disp: 90 tablet, Rfl: 3 .  ARIPiprazole (ABILIFY) 10 MG tablet, Take 1 tablet (10 mg total) by mouth daily., Disp:  , Rfl:  .  aspirin  EC 81 MG tablet, Take 81 mg by mouth daily., Disp: , Rfl:  .  atorvastatin (LIPITOR) 20 MG tablet, Take 1 tablet (20 mg total) by mouth daily at 6 PM., Disp: 30 tablet, Rfl: 1 .  budesonide-formoterol (SYMBICORT) 80-4.5 MCG/ACT inhaler, Inhale 2 puffs into the lungs 2 (two) times daily., Disp: 1 Inhaler, Rfl: 12 .  carvedilol (COREG) 25 MG tablet, TAKE 2 TABLETS BY MOUTH TWICE DAILY WITH A MEAL., Disp: 60 tablet, Rfl: 0 .  clonazePAM (KLONOPIN) 0.5 MG tablet, Take 0.5 mg by mouth 2 (two) times daily., Disp: , Rfl:  .  gabapentin (NEURONTIN) 600 MG tablet, Take 600 mg by mouth 3 (three) times daily. , Disp: , Rfl:  .  hydrALAZINE (APRESOLINE) 100 MG tablet, Take 1 tablet (100 mg total) by mouth 3 (three) times daily., Disp: 30 tablet, Rfl: 1 .  insulin glargine (LANTUS) 100 UNIT/ML injection, Inject 30 Units at bedtime into the skin. , Disp: , Rfl:  .  insulin lispro (HUMALOG) 100 UNIT/ML injection, Inject 15 Units into the skin 3 (three) times daily before meals. , Disp: , Rfl:  .  Ipratropium-Albuterol (COMBIVENT RESPIMAT) 20-100 MCG/ACT AERS respimat, Inhale 1 puff into the lungs every 6 (six) hours as needed for wheezing or shortness of breath., Disp: 1 Inhaler, Rfl: 0 .  linaclotide (LINZESS) 145 MCG CAPS capsule, Take 1 capsule (145 mcg total) by mouth daily before breakfast., Disp: 30 capsule, Rfl: 5 .  Magnesium 400 MG CAPS, Take 400 mg by mouth 2 (two) times daily., Disp: 180 capsule, Rfl: 3 .  metFORMIN (GLUCOPHAGE) 500 MG tablet, Take 500 mg 2 (two) times daily with a meal by mouth., Disp: , Rfl:  .  methocarbamol (ROBAXIN) 500 MG tablet, Take  1 tablet (500 mg total) by mouth every 8 (eight) hours as needed for muscle spasms., Disp: 8 tablet, Rfl: 0 .  nitroGLYCERIN (NITROSTAT) 0.4 MG SL tablet, Place 1 tablet (0.4 mg total) under the tongue every 5 (five) minutes as needed for chest pain., Disp: 30 tablet, Rfl: 12 .  ondansetron (ZOFRAN) 4 MG tablet, Take 4 mg by mouth daily as needed for  nausea or vomiting., Disp: , Rfl:  .  oxyCODONE-acetaminophen (PERCOCET/ROXICET) 5-325 MG tablet, Take 1 tablet by mouth every 6 (six) hours as needed., Disp: 20 tablet, Rfl: 0 .  pantoprazole (PROTONIX) 40 MG tablet, Take 1 tablet (40 mg total) by mouth 2 (two) times daily before a meal., Disp: 60 tablet, Rfl: 5 .  potassium chloride SA (KLOR-CON) 20 MEQ tablet, Take 2 tablets (40 mEq total) by mouth 2 (two) times daily., Disp: 120 tablet, Rfl: 6 .  promethazine (PHENERGAN) 25 MG tablet, Take 1 tablet (25 mg total) by mouth every 6 (six) hours as needed for nausea or vomiting., Disp: 30 tablet, Rfl: 0 .  pyridostigmine (MESTINON) 60 MG tablet, Take 60 mg by mouth every 8 (eight) hours., Disp: , Rfl:  .  sacubitril-valsartan (ENTRESTO) 97-103 MG, Take 1 tablet by mouth 2 (two) times daily., Disp: 60 tablet, Rfl: 6 .  torsemide (DEMADEX) 20 MG tablet, Take 2 tablets (40 mg total) by mouth 2 (two) times daily., Disp: 120 tablet, Rfl: 11 .  traZODone (DESYREL) 100 MG tablet, Take 2 tablets (200 mg total) by mouth at bedtime., Disp:  , Rfl:  .  ketorolac (TORADOL) 10 MG tablet, Take 1 tablet (10 mg total) by mouth every 8 (eight) hours as needed., Disp: 15 tablet, Rfl: 0 .  ondansetron (ZOFRAN ODT) 8 MG disintegrating tablet, Take 1 tablet (8 mg total) by mouth every 8 (eight) hours as needed for nausea or vomiting., Disp: 20 tablet, Rfl: 0  Objective Blood pressure (!) 198/126, pulse 92, height 5\' 6"  (1.676 m), weight 232 lb (105.2 kg), last menstrual period 08/21/2019.  Gen mod distress due to pain Abdomen soft tender  Pertinent ROS No burning with urination, frequency or urgency No nausea, vomiting or diarrhea Nor fever chills or other constitutional symptoms   Labs or studies Reviewed her MRI and labs    Impression Diagnoses this Encounter::   ICD-10-CM   1. 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 ketorolac (TORADOL) 10 MG tablet    Sig: Take 1 tablet (10 mg total) by mouth every 8 (eight) hours as needed.    Dispense:  15 tablet    Refill:  0    Patient can take ibuprofen, she is going to try the [place]  . ondansetron (ZOFRAN ODT) 8 MG disintegrating tablet    Sig: Take 1 tablet (8 mg total) by mouth every 8 (eight) hours as needed for nausea or vomiting.    Dispense:  20 tablet    Refill:  0    Labs or Scans Ordered: No orders of the defined types were placed in this encounter.   Management:: Will need supracervical hysterectomy for management of her degenerating 9 cm myoma which is causing a great deal of pain Discussed with Dr Charna Elizabeth who recommends pre op cardiology evaluation for assessment of her hypertension and CHF  Follow up Return for make appt with [facility] for evaluation pre operatively.  Face to face time:  15 minutes  Greater than 50% of the visit time was spent in counseling and coordination of care with the patient.  The summary and outline of the counseling and care coordination is summarized in the note above.   All questions were answered.

## 2019-09-03 ENCOUNTER — Telehealth: Payer: Self-pay | Admitting: Obstetrics & Gynecology

## 2019-09-03 NOTE — Telephone Encounter (Signed)
Telephoned patient at home number advised patient she would need to schedule appointment with Cardiology. Patient voiced understanding.

## 2019-09-03 NOTE — Telephone Encounter (Signed)
Patient called, stated that she was seen on 4/15 and Dr. Elonda Husky told her she needs surgery, but she stated that we are supposed to be trying to get clearance from her heart doctor.  The patient is in pain and would like to know the status of this.  [phone]

## 2019-09-04 ENCOUNTER — Ambulatory Visit (INDEPENDENT_AMBULATORY_CARE_PROVIDER_SITE_OTHER): Payer: Medicaid Other | Admitting: Student

## 2019-09-04 ENCOUNTER — Other Ambulatory Visit: Payer: Self-pay

## 2019-09-04 ENCOUNTER — Telehealth: Payer: Self-pay | Admitting: Obstetrics & Gynecology

## 2019-09-04 ENCOUNTER — Encounter: Payer: Self-pay | Admitting: Student

## 2019-09-04 VITALS — BP 160/115 | HR 79 | Temp 97.9°F | Ht 66.0 in | Wt 232.0 lb

## 2019-09-04 DIAGNOSIS — I428 Other cardiomyopathies: Secondary | ICD-10-CM | POA: Diagnosis not present

## 2019-09-04 DIAGNOSIS — Z01810 Encounter for preprocedural cardiovascular examination: Secondary | ICD-10-CM

## 2019-09-04 DIAGNOSIS — I10 Essential (primary) hypertension: Secondary | ICD-10-CM | POA: Diagnosis not present

## 2019-09-04 DIAGNOSIS — G4733 Obstructive sleep apnea (adult) (pediatric): Secondary | ICD-10-CM | POA: Diagnosis not present

## 2019-09-04 MED ORDER — AMLODIPINE BESYLATE 10 MG PO TABS: 10.0000 mg | ORAL_TABLET | Freq: Every day | ORAL | 3 refills | Status: DC

## 2019-09-04 MED ORDER — POTASSIUM CHLORIDE CRYS ER 20 MEQ PO TBCR: 40.0000 meq | EXTENDED_RELEASE_TABLET | Freq: Every day | ORAL | 3 refills | Status: DC

## 2019-09-04 MED ORDER — TORSEMIDE 20 MG PO TABS: 40.0000 mg | ORAL_TABLET | Freq: Every day | ORAL | 11 refills | Status: DC

## 2019-09-04 NOTE — Progress Notes (Signed)
Cardiology Office Note    Date:  09/04/2019   ID:  Chelsey Santiago, DOB Apr 04, 1976, MRN [id number]  PCP:  Rosita Fire, MD  Cardiologist: Carlyle Dolly, MD    Chief Complaint  Patient presents with  . Follow-up    Preoperative Surgical Clearance    History of Present Illness:    Chelsey Santiago is a 44 y.o. female with past medical history of chronic combined systolic and diastolic CHF/NICM (EF [id number] by echo in 12/2015 with cath showing normal cors, EF at 45% by repeat echo in 10/2018), HTN, IDDM and OSA who presents to the office today for preoperative cardiac clearance.  She was last examined by Melina Copa, PA-C in 05/2019 and reported compliance with her cardiac regimen at that time including Coreg 50 mg twice daily, Hydralazine 100 mg 3 times daily, Torsemide 60mg  in AM/40mg  in PM and Entresto 97-103mg  BID. BP was elevated at 151/100 during her visit, therefore a renal artery duplex was recommended along with 24 hour urine metanephrines to exclude a pheochromocytoma. Amlodipine 5mg  daily was added to her medication regimen and she was to have a follow-up appointment in 1 week but has not been evaluated since. Follow-up labs did show hypokalemia and hypomagnesemia with K-dur dosing being increased and her starting MagOxide 400mg  BID.   She was recently evaluated by OBGYN for follow-up as recent MRI of the pelvis showed a large, well-circumscribed mass within the left side of the pelvis favored to represent a large subserosal, exophytic fibroid and to also have a small left sided calcified sub mucosal fibroid. Dr. Elonda Husky therefore recommended a supracervical hysterectomy.   In talking with the patient today, she reports overall doing well from a cardiac perspective since her last visit. She denies any recent chest pain or palpitations.  Reports her breathing has significantly improved. No reports of orthopnea, PND or lower extremity edema. In reviewing her medication list, she is  listed as taking Torsemide 40 mg twice daily reports typically taking this once daily. She has however remained on K-dur 40 mEq twice daily and K+ was elevated to 5.4 by most recent labs.   She does not check her blood pressure regularly at home but SBP was in the 190's when checked by her gynecologist a few days ago. She reports being under significant pain at that time and thought this might be contributing to her readings.  Past Medical History:  Diagnosis Date  . Anemia    H&H of 10.6/33 and 07/2008 and 11.9/35 and 09/2010  . Anxiety   . Chronic combined systolic and diastolic CHF (congestive heart failure) ([place])   . Depression with anxiety   . Diabetes mellitus without complication ([place])   . Hypertension   . Hypertensive heart disease 2009   Pulmonary edema postpartum; mild to moderate mitral regurgitation when hospitalized for CHF in 2009; Echocardiogram in 12/2009-no MR and normal EF; normal CXR in 09/2010  . Migraine headache   . Miscarriage 03/19/2013  . Obesity 04/16/2009  . Osteoarthritis, knee 03/29/2011  . Preeclampsia   . Pulmonary edema   . Sleep apnea   . Threatened abortion in early pregnancy 03/15/2013    Past Surgical History:  Procedure Laterality Date  . BREAST REDUCTION SURGERY  2002  . CARDIAC CATHETERIZATION N/A 12/22/2015   Procedure: Left Heart Cath and Coronary Angiography;  Surgeon: Peter M Martinique, MD;  Location: [facility];  Service: Cardiovascular;  Laterality: N/A;  . CESAREAN SECTION N/A 04/09/2014   Procedure: CESAREAN SECTION;  Surgeon: Mora Bellman, MD;  Location: [facility];  Service: Obstetrics;  Laterality: N/A;  . CHOLECYSTECTOMY      Current Medications: Outpatient Medications Prior to Visit  Medication Sig Dispense Refill  . albuterol (PROVENTIL HFA;VENTOLIN HFA) 108 (90 Base) MCG/ACT inhaler Inhale 1 puff into the lungs every 6 (six) hours as needed for wheezing or shortness of breath.    . ARIPiprazole (ABILIFY) 10 MG tablet Take 1  tablet (10 mg total) by mouth daily.    Marland Kitchen aspirin EC 81 MG tablet Take 81 mg by mouth daily.    Marland Kitchen atorvastatin (LIPITOR) 20 MG tablet Take 1 tablet (20 mg total) by mouth daily at 6 PM. 30 tablet 1  . budesonide-formoterol (SYMBICORT) 80-4.5 MCG/ACT inhaler Inhale 2 puffs into the lungs 2 (two) times daily. 1 Inhaler 12  . carvedilol (COREG) 25 MG tablet TAKE 2 TABLETS BY MOUTH TWICE DAILY WITH A MEAL. 60 tablet 0  . clonazePAM (KLONOPIN) 0.5 MG tablet Take 0.5 mg by mouth 2 (two) times daily.    Marland Kitchen gabapentin (NEURONTIN) 600 MG tablet Take 600 mg by mouth 3 (three) times daily.     . hydrALAZINE (APRESOLINE) 100 MG tablet Take 1 tablet (100 mg total) by mouth 3 (three) times daily. 30 tablet 1  . insulin glargine (LANTUS) 100 UNIT/ML injection Inject 30 Units at bedtime into the skin.     Marland Kitchen insulin lispro (HUMALOG) 100 UNIT/ML injection Inject 15 Units into the skin 3 (three) times daily before meals.     . Ipratropium-Albuterol (COMBIVENT RESPIMAT) 20-100 MCG/ACT AERS respimat Inhale 1 puff into the lungs every 6 (six) hours as needed for wheezing or shortness of breath. 1 Inhaler 0  . ketorolac (TORADOL) 10 MG tablet Take 1 tablet (10 mg total) by mouth every 8 (eight) hours as needed. 15 tablet 0  . linaclotide (LINZESS) 145 MCG CAPS capsule Take 1 capsule (145 mcg total) by mouth daily before breakfast. 30 capsule 5  . Magnesium 400 MG CAPS Take 400 mg by mouth 2 (two) times daily. 180 capsule 3  . methocarbamol (ROBAXIN) 500 MG tablet Take 1 tablet (500 mg total) by mouth every 8 (eight) hours as needed for muscle spasms. 8 tablet 0  . nitroGLYCERIN (NITROSTAT) 0.4 MG SL tablet Place 1 tablet (0.4 mg total) under the tongue every 5 (five) minutes as needed for chest pain. 30 tablet 12  . ondansetron (ZOFRAN ODT) 8 MG disintegrating tablet Take 1 tablet (8 mg total) by mouth every 8 (eight) hours as needed for nausea or vomiting. 20 tablet 0  . oxyCODONE-acetaminophen (PERCOCET/ROXICET) 5-325 MG  tablet Take 1 tablet by mouth every 6 (six) hours as needed. 20 tablet 0  . pantoprazole (PROTONIX) 40 MG tablet Take 1 tablet (40 mg total) by mouth 2 (two) times daily before a meal. 60 tablet 5  . promethazine (PHENERGAN) 25 MG tablet Take 1 tablet (25 mg total) by mouth every 6 (six) hours as needed for nausea or vomiting. 30 tablet 0  . pyridostigmine (MESTINON) 60 MG tablet Take 60 mg by mouth every 8 (eight) hours.    . sacubitril-valsartan (ENTRESTO) 97-103 MG Take 1 tablet by mouth 2 (two) times daily. 60 tablet 6  . traZODone (DESYREL) 100 MG tablet Take 2 tablets (200 mg total) by mouth at bedtime.    Marland Kitchen amLODipine (NORVASC) 5 MG tablet Take 1 tablet (5 mg total) by mouth daily. 90 tablet 3  . metFORMIN (GLUCOPHAGE) 500 MG tablet Take 500  mg 2 (two) times daily with a meal by mouth.    . potassium chloride SA (KLOR-CON) 20 MEQ tablet Take 2 tablets (40 mEq total) by mouth 2 (two) times daily. 120 tablet 6  . torsemide (DEMADEX) 20 MG tablet Take 2 tablets (40 mg total) by mouth 2 (two) times daily. 120 tablet 11  . ondansetron (ZOFRAN) 4 MG tablet Take 4 mg by mouth daily as needed for nausea or vomiting.     No facility-administered medications prior to visit.     Allergies:   Diclofenac, Tramadol, Vicodin [hydrocodone-acetaminophen], and Hydrocodone bitartrate er   Social History   Socioeconomic History  . Marital status: Married    Spouse name: Not on file  . Number of children: 6  . Years of education: Not on file  . Highest education level: Not on file  Occupational History  . Occupation: unemployed    Fish farm manager: UNEMPLOYED  Tobacco Use  . Smoking status: Never Smoker  . Smokeless tobacco: Never Used  Substance and Sexual Activity  . Alcohol use: Yes    Comment: occ  . Drug use: No  . Sexual activity: Yes    Birth control/protection: Surgical    Comment: tubal  Other Topics Concern  . Not on file  Social History Narrative   Lives in [place]   Engaged/boyfriend  (father to youngest chid)   4 children: daughter (44 as of 2013), sons (15, 41, 69 as of 2013)   Religion: christian   Social Determinants of Radio broadcast assistant Strain:   . Difficulty of Paying Living Expenses:   Food Insecurity:   . Worried About Charity fundraiser in the Last Year:   . Arboriculturist in the Last Year:   Transportation Needs:   . Film/video editor (Medical):   Marland Kitchen Lack of Transportation (Non-Medical):   Physical Activity:   . Days of Exercise per Week:   . Minutes of Exercise per Session:   Stress:   . Feeling of Stress :   Social Connections:   . Frequency of Communication with Friends and Family:   . Frequency of Social Gatherings with Friends and Family:   . Attends Religious Services:   . Active Member of Clubs or Organizations:   . Attends Archivist Meetings:   Marland Kitchen Marital Status:      Family History:  The patient's family history includes ADD / ADHD in her son; Diabetes (age of onset: 70) in her mother; Heart attack in her brother; Heart disease in her father and mother; Heart disease (age of onset: 85) in her maternal grandmother; Hyperlipidemia in her paternal grandfather; Hypertension in her father, maternal uncle, and paternal grandfather.   Review of Systems:   Please see the history of present illness.     General:  No chills, fever, night sweats or weight changes.  Cardiovascular:  No chest pain, dyspnea on exertion, edema, orthopnea, palpitations, paroxysmal nocturnal dyspnea. Dermatological: No rash, lesions/masses Respiratory: No cough, dyspnea Urologic: No hematuria, dysuria Abdominal:   No nausea, vomiting, diarrhea, bright red blood per rectum, melena, or hematemesis. Positive for abdominal discomfort.  Neurologic:  No visual changes, wkns, changes in mental status. All other systems reviewed and are otherwise negative except as noted above.   Physical Exam:    VS:  BP (!) 160/115   Pulse 79   Temp 97.9 F (36.6  C)   Ht 5\' 6"  (1.676 m)   Wt 232 lb (105.2 kg)  LMP 08/21/2019   SpO2 97%   BMI 37.45 kg/m    General: Well developed, well nourished,female appearing in no acute distress. Head: Normocephalic, atraumatic, sclera non-icteric.  Neck: No carotid bruits. JVD not elevated.  Lungs: Respirations regular and unlabored, without wheezes or rales.  Heart: Regular rate and rhythm. No S3 or S4.  No murmur, no rubs, or gallops appreciated. Abdomen: Soft, non-tender, non-distended. No obvious abdominal masses. Msk:  Strength and tone appear normal for age. No obvious joint deformities or effusions. Extremities: No clubbing or cyanosis. No edema.  Distal pedal pulses are 2+ bilaterally. Neuro: Alert and oriented X 3. Moves all extremities spontaneously. No focal deficits noted. Psych:  Responds to questions appropriately with a normal affect. Skin: No rashes or lesions noted  Wt Readings from Last 3 Encounters:  09/04/19 232 lb (105.2 kg)  08/30/19 232 lb (105.2 kg)  08/28/19 230 lb (104.3 kg)     Studies/Labs Reviewed:   EKG:  EKG is not ordered today. EKG from 07/04/2019 is reviewed and shows NSR, HR 89 with LVH and associated repol abnormalities along the lateral leads which is similar to prior tracings.   Recent Labs: 05/05/2019: ALT 29; B Natriuretic Peptide 1,083.0 06/05/2019: TSH 0.719 07/04/2019: Magnesium 1.6 08/28/2019: BUN 15; Creatinine, Ser 0.93; Hemoglobin 10.5; Platelets 274; Potassium 5.4; Sodium 133   Lipid Panel    Component Value Date/Time   CHOL 211 (H) 06/03/2018 0615   TRIG 223 (H) 06/03/2018 0615   HDL 39 (L) 06/03/2018 0615   CHOLHDL 5.4 06/03/2018 0615   VLDL 45 (H) 06/03/2018 0615   LDLCALC 127 (H) 06/03/2018 0615    Additional studies/ records that were reviewed today include:   Echocardiogram: 10/2018 IMPRESSIONS    1. The left ventricle has a visually estimated ejection fraction of 45%.  The cavity size was normal. There is severe concentric left  ventricular  hypertrophy. Left ventricular diastolic Doppler parameters are consistent  with pseudonormalization. Elevated  left ventricular end-diastolic pressure Left ventricular diffuse  hypokinesis.  2. The right ventricle has normal systolic function. The cavity was  normal. There is no increase in right ventricular wall thickness.  3. Left atrial size was severely dilated.  4. The mitral valve is grossly normal.  5. The tricuspid valve is grossly normal.  6. The aortic valve is tricuspid. Mild thickening of the aortic valve.  7. Moderate aortic annular calcification.  8. The aortic root is normal in size and structure.   Assessment:    1. Preop cardiovascular exam   2. NICM (nonischemic cardiomyopathy) ([place])   3. Essential hypertension   4. OSA (obstructive sleep apnea)      Plan:   In order of problems listed above:  1. Preoperative Cardiac Clearance for Hysterectomy - From a cardiac perspective, she has overall done well and denies any recent anginal symptoms. She does have a history of a nonischemic cardiomyopathy with catheterization in 2017 showing normal coronary arteries and EF had improved to 45% by most recent imaging. - She appears euvolemic by examination today but BP is significantly elevated. This was initially at 170/108 and rechecked personally and still elevated at 160/115. I will increase her Amlodipine from 5 mg daily to 10 mg daily. We reviewed that this can take up to 2 weeks to have its full effect but I suspect her known untreated sleep apnea along with abdominal pain are contributing to her elevated readings as well. I recommended that she come back for a nurse visit  in several days to have BP rechecked. If readings improve, can hopefully provide cardiac clearance at that time.  Will forward today's note to Dr. Elonda Husky.   2. Chronic Combined Systolic and Diastolic CHF/ NICM - EF was previously 40-45% by echo in 12/2015 with cath showing normal cors,  EF at 45% by repeat echo in 10/2018. - She denies any recent orthopnea, PND or lower extremity edema. Appears euvolemic by examination today. Continue current regimen with Coreg 50mg  BID, Entresto 91-103mg  BID and Torsemide 40mg  daily. Given hyperkalemia by recent labs, will reduce K-dur dosing. Anticipate she will have routine labs prior to surgery but if not, would recommend obtaining a repeat BMET next week following dose adjustment.   3. Accelerated HTN - BP was elevated at 160/115 during today's visit. She is currently on the maximum dose of Coreg, Entresto and Hydralazine. Will plan to titrate Amlodipine from 5 mg daily to 10 mg daily. We did review the importance of limiting sodium intake. - She did not have renal dopplers obtained following her last visit and I recommend rescheduling following her hysterectomy.  4. OSA - Her repeat sleep study was canceled in 06/2019 due to the weather. She wishes to reschedule after her hysterectomy.    Medication Adjustments/Labs and Tests Ordered: Current medicines are reviewed at length with the patient today.  Concerns regarding medicines are outlined above.  Medication changes, Labs and Tests ordered today are listed in the Patient Instructions below. Patient Instructions  Medication Instructions:   Increase Amlodipine to 10mg  daily.  Reduce K-Dur to 40 mEq once daily.   Labwork: None  Testing/Procedures: None  Follow-Up: Nurse Visit for BP check on Friday. Follow-up with Bernerd Pho, PA-C or Dr. Harl Bowie in 2 months.   Any Other Special Instructions Will Be Listed Below (If Applicable).   If you need a refill on your cardiac medications before your next appointment, please call your pharmacy.    Signed, Erma Heritage, PA-C  09/04/2019 4:40 PM    [facility] [address] [place], [place] [address] Phone: [phone] Fax: [phone]

## 2019-09-04 NOTE — Telephone Encounter (Signed)
Telephoned patient at home number and advised patient she would need to get clearance from cardiologist. Advised patient when needs refill can call pharmacy for refill request. Patient voiced understanding.

## 2019-09-04 NOTE — Telephone Encounter (Signed)
Pt states that her cardiologist will not clear her for surgery because her bp is 160/115. Pt states she goes back on Friday. Pt is wanting to know if Dr. Elonda Husky would admit her to the hospital on a Monday and let her get bp meds through IV to lower her bp and have surgery on a Thursday. Pt states she is in so much pain and almost out of medication.

## 2019-09-04 NOTE — Patient Instructions (Signed)
Medication Instructions:   Increase Amlodipine to 10mg  daily.  Reduce K-Dur to 40 mEq once daily.   Labwork: None  Testing/Procedures: None  Follow-Up: Nurse Visit for BP check on Friday. Follow-up with Bernerd Pho, PA-C or Dr. Harl Bowie in 2 months.   Any Other Special Instructions Will Be Listed Below (If Applicable).     If you need a refill on your cardiac medications before your next appointment, please call your pharmacy.

## 2019-09-05 ENCOUNTER — Other Ambulatory Visit: Payer: Self-pay | Admitting: Obstetrics & Gynecology

## 2019-09-05 ENCOUNTER — Telehealth: Payer: Self-pay | Admitting: Obstetrics & Gynecology

## 2019-09-05 NOTE — Telephone Encounter (Signed)
Chelsey Santiago sent refill request for Toradol but she got Percocet at the ER and he wanted her to take both can he call in both . Told pt it would be Thursday since it was already 4:40 and Eure was not here today

## 2019-09-06 ENCOUNTER — Telehealth: Payer: Self-pay | Admitting: *Deleted

## 2019-09-06 ENCOUNTER — Telehealth: Payer: Self-pay

## 2019-09-06 ENCOUNTER — Telehealth: Payer: Self-pay | Admitting: Obstetrics & Gynecology

## 2019-09-06 ENCOUNTER — Other Ambulatory Visit: Payer: Self-pay

## 2019-09-06 ENCOUNTER — Ambulatory Visit (INDEPENDENT_AMBULATORY_CARE_PROVIDER_SITE_OTHER): Payer: Medicaid Other | Admitting: *Deleted

## 2019-09-06 ENCOUNTER — Emergency Department (HOSPITAL_COMMUNITY)
Admission: EM | Admit: 2019-09-06 | Discharge: 2019-09-06 | Disposition: A | Discharge status: Home / Self Care | Payer: Medicaid Other | Attending: Emergency Medicine | Admitting: Emergency Medicine

## 2019-09-06 ENCOUNTER — Encounter (HOSPITAL_COMMUNITY): Payer: Self-pay

## 2019-09-06 VITALS — BP 122/68 | HR 70 | Temp 96.6°F | Ht 66.0 in | Wt 230.0 lb

## 2019-09-06 DIAGNOSIS — E119 Type 2 diabetes mellitus without complications: Secondary | ICD-10-CM | POA: Insufficient documentation

## 2019-09-06 DIAGNOSIS — Z79899 Other long term (current) drug therapy: Secondary | ICD-10-CM | POA: Diagnosis not present

## 2019-09-06 DIAGNOSIS — R1030 Lower abdominal pain, unspecified: Secondary | ICD-10-CM | POA: Diagnosis present

## 2019-09-06 DIAGNOSIS — I10 Essential (primary) hypertension: Secondary | ICD-10-CM

## 2019-09-06 DIAGNOSIS — I110 Hypertensive heart disease with heart failure: Secondary | ICD-10-CM | POA: Insufficient documentation

## 2019-09-06 DIAGNOSIS — I5042 Chronic combined systolic (congestive) and diastolic (congestive) heart failure: Secondary | ICD-10-CM | POA: Insufficient documentation

## 2019-09-06 MED ORDER — KETOROLAC TROMETHAMINE 10 MG PO TABS: ORAL_TABLET | ORAL | 0 refills | Status: DC

## 2019-09-06 MED ORDER — OXYCODONE-ACETAMINOPHEN 5-325 MG PO TABS
1.0000 | ORAL_TABLET | Freq: Once | ORAL | Status: AC
  Administered 2019-09-06: 1 via ORAL
  Filled 2019-09-06: qty 1

## 2019-09-06 MED ORDER — ONDANSETRON 8 MG PO TBDP: 8.0000 mg | ORAL_TABLET | Freq: Three times a day (TID) | ORAL | 0 refills | Status: DC | PRN

## 2019-09-06 MED ORDER — OXYCODONE-ACETAMINOPHEN 5-325 MG PO TABS: 1.0000 | ORAL_TABLET | Freq: Four times a day (QID) | ORAL | 0 refills | Status: DC | PRN

## 2019-09-06 MED ORDER — KETOROLAC TROMETHAMINE 30 MG/ML IJ SOLN
30.0000 mg | Freq: Once | INTRAMUSCULAR | Status: AC
  Administered 2019-09-06: 30 mg via INTRAMUSCULAR
  Filled 2019-09-06: qty 1

## 2019-09-06 NOTE — Telephone Encounter (Signed)
Pt states that she is in the ER and in a lot of pain. She is wanting to know when she can have her surgery. Cardiology has not cleared pt due to her BP being 168/116 the other day at her appt. Pt requesting to speak with Dr. Elonda Husky.

## 2019-09-06 NOTE — Progress Notes (Signed)
Pt in for Blood pressure check. States that she feels fine. No complaints230 at this time.

## 2019-09-06 NOTE — Telephone Encounter (Signed)
Tell patient cardiology has her cleared and I will be looking to get her on the schedule

## 2019-09-06 NOTE — Telephone Encounter (Signed)
-----   Message from Erma Heritage, [place] sent at 09/06/2019  2:38 PM EDT ----- FYI, patient's BP has improved following medication adjustments. Cleared to proceed with planned surgery from a cardiac perspective.  [place],  [place]

## 2019-09-06 NOTE — ED Provider Notes (Signed)
Emergency Department Provider Note   I have reviewed the triage vital signs and the nursing notes.   HISTORY  Chief Complaint Abdominal Pain   HPI Chelsey Santiago is a 44 y.o. female with PMH reviewed below including uterine fibroid with planned hysterectomy with Dr. Elonda Husky presents to the emergency department with continued lower abdominal pain in the setting of running out of her medication.  Patient has been taking Toradol and had Percocet added during her last ED visit on 4/13.  Patient has since run out of all medications in the past 12 hours and is having return of moderate to severe pain.  She denies any new or suddenly worsening pain symptoms.  No fevers.  No vomiting or diarrhea.  No new pain quality or character.  She saw the cardiology group yesterday for preoperative clearance of her blood pressure.  She notes that the amlodipine dose was changed and her blood pressure today is within normal limits.  She called her OB/GYN office for medication refills but her doctor was not in the office and she was told it would be today when they can get back with her.  She did not feel like she could wait for that call and so came to the emergency department.   Past Medical History:  Diagnosis Date  . Anemia    H&H of 10.6/33 and 07/2008 and 11.9/35 and 09/2010  . Anxiety   . Chronic combined systolic and diastolic CHF (congestive heart failure) ([place])   . Depression with anxiety   . Diabetes mellitus without complication ([place])   . Hypertension   . Hypertensive heart disease 2009   Pulmonary edema postpartum; mild to moderate mitral regurgitation when hospitalized for CHF in 2009; Echocardiogram in 12/2009-no MR and normal EF; normal CXR in 09/2010  . Migraine headache   . Miscarriage 03/19/2013  . Obesity 04/16/2009  . Osteoarthritis, knee 03/29/2011  . Preeclampsia   . Pulmonary edema   . Sleep apnea   . Threatened abortion in early pregnancy 03/15/2013    Patient Active Problem List     Diagnosis Date Noted  . Abdominal pain, epigastric 03/14/2019  . Dysphagia 03/14/2019  . Gastroesophageal reflux disease 03/14/2019  . Class 2 obesity   . Acute exacerbation of CHF (congestive heart failure) ([place]) 10/26/2018  . Chronic combined systolic and diastolic CHF (congestive heart failure) ([place]) 06/02/2018  . Uncontrolled type 2 diabetes mellitus with hyperglycemia ([place]) 06/02/2018  . Influenza B 05/13/2018  . Hypomagnesemia 05/12/2018  . Headache 05/12/2018  . Upper respiratory tract infection   . HCAP (healthcare-associated pneumonia)   . Constipation 10/17/2017  . Bad headache   . CHF exacerbation ([place]) 09/29/2017  . Iron deficiency anemia 05/16/2017  . Vitamin D deficiency 11/25/2016  . Iron deficiency 11/25/2016  . History of acute myocardial infarction 10/05/2016  . CHF (congestive heart failure) ([place]) 05/06/2016  . Diabetes mellitus with complication ([place]) [id number]  . Leukocytosis 05/06/2016  . Neuropathy 05/06/2016  . Depression 04/16/2016  . Chronic tension-type headache, not intractable 04/16/2016  . AKI (acute kidney injury) ([place])   . Hyperkalemia   . Nonischemic cardiomyopathy ([place])   . Acute on chronic combined systolic and diastolic ACC/AHA stage C congestive heart failure ([place]) 04/03/2016  . Acute on chronic combined systolic and diastolic CHF, NYHA class 4 ([place]) 04/03/2016  . Hypertensive emergency 04/03/2016  . Cardiomyopathy due to hypertension ([place]) 12/22/2015  . Normal coronary arteries 12/22/2015  . Troponin level elevated 12/22/2015  . NSTEMI (non-ST elevated  myocardial infarction) ([place]) 12/20/2015  . Dental infection 10/10/2015  . Chest pain 09/05/2015  . Systolic CHF, chronic ([place]) 09/05/2015  . LLQ pain   . Type 2 diabetes mellitus without complication ([place]) [id number]  . Essential hypertension   . 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 Hypertensive urgency 04/22/2014  . Acute CHF ([place]) 04/22/2014  . S/P cesarean section 04/11/2014  .  Acute pulmonary edema ([place]) 04/11/2014  . Postoperative anemia 04/11/2014  . Elevated serum creatinine 04/11/2014  . Preeclampsia, severe 04/09/2014  . Pre-eclampsia superimposed on chronic hypertension, antepartum 04/08/2014  . Dyspnea 04/08/2014  . Polyhydramnios in third trimester, antepartum 03/14/2014  . Abnormal maternal glucose tolerance, antepartum 03/11/2014  . High-risk pregnancy 03/11/2014  . Pre-existing essential hypertension complicating pregnancy 0000000  . Impaired glucose tolerance during pregnancy, antepartum 11/27/2013  . Leiomyoma of uterus 11/22/2013  . History of gestational diabetes in prior pregnancy, currently pregnant in first trimester 11/22/2013  . Hx of preeclampsia, prior pregnancy, currently pregnant 11/22/2013  . Short interval between pregnancies affecting pregnancy, antepartum 11/22/2013  . Supervision of high-risk pregnancy of elderly primigravida (>= 7 years old at delivery), third trimester 11/22/2013  . Miscarriage 03/19/2013  . Major depressive disorder, single episode, unspecified 09/27/2011  . Hypertension   . Hypertensive cardiovascular disease   . Microcytic anemia   . Osteoarthrosis involving lower leg 03/29/2011  . Hypokalemia 12/12/2009  . OSA on CPAP 12/09/2009  . Morbid obesity ([place]) 04/16/2009    Past Surgical History:  Procedure Laterality Date  . BREAST REDUCTION SURGERY  2002  . CARDIAC CATHETERIZATION N/A 12/22/2015   Procedure: Left Heart Cath and Coronary Angiography;  Surgeon: Peter M Martinique, MD;  Location: [facility];  Service: Cardiovascular;  Laterality: N/A;  . CESAREAN SECTION N/A 04/09/2014   Procedure: CESAREAN SECTION;  Surgeon: Mora Bellman, MD;  Location: [facility];  Service: Obstetrics;  Laterality: N/A;  . CHOLECYSTECTOMY      Allergies Diclofenac, Tramadol, Vicodin [hydrocodone-acetaminophen], and Hydrocodone bitartrate er  Family History  Problem Relation Age of Onset  . Diabetes Mother 26  .  Heart disease Mother   . Hyperlipidemia Paternal Grandfather   . Hypertension Paternal Grandfather   . Heart disease Father   . Hypertension Father   . Heart disease Maternal Grandmother 60  . ADD / ADHD Son   . Hypertension Maternal Uncle   . Heart attack Brother   . Sudden death Neg Hx   . Colon cancer Neg Hx   . Celiac disease Neg Hx   . Inflammatory bowel disease Neg Hx     Social History Social History   Tobacco Use  . Smoking status: Never Smoker  . Smokeless tobacco: Never Used  Substance Use Topics  . Alcohol use: Yes    Comment: occ  . Drug use: No    Review of Systems  Constitutional: No fever/chills Cardiovascular: Denies chest pain. Respiratory: Denies shortness of breath. Gastrointestinal: Positive lower abdominal pain.  No nausea, no vomiting.  No diarrhea.  Musculoskeletal: Negative for back pain. Skin: Negative for rash. Neurological: Negative for headaches.  10-point ROS otherwise negative.  ____________________________________________   PHYSICAL EXAM:  VITAL SIGNS: ED Triage Vitals  Enc Vitals Group     BP 09/06/19 0808 123/79     Pulse Rate 09/06/19 0808 76     Resp 09/06/19 0808 18     Temp 09/06/19 0811 97.9 F (36.6 C)     Temp Source 09/06/19 0811 Oral     SpO2 09/06/19  0808 97 %     Weight 09/06/19 0809 231 lb 7.7 oz (105 kg)     Height 09/06/19 0809 5\' 6"  (1.676 m)   Constitutional: Alert and oriented. Well appearing and in no acute distress. Eyes: Conjunctivae are normal.  Head: Atraumatic. Nose: No congestion/rhinnorhea. Mouth/Throat: Mucous membranes are moist. Neck: No stridor.   Cardiovascular: Normal rate, regular rhythm. Good peripheral circulation. Grossly normal heart sounds.   Respiratory: Normal respiratory effort.  No retractions. Lungs CTAB. Gastrointestinal: Soft with mild lower abdominal tenderness. No peritoneal signs. No distention.  Musculoskeletal: No gross deformities of extremities. Neurologic:  Normal  speech and language.  Skin:  Skin is warm, dry and intact. No rash noted.   ____________________________________________   PROCEDURES  Procedure(s) performed:   Procedures  None  ____________________________________________   INITIAL IMPRESSION / ASSESSMENT AND PLAN / ED COURSE  Pertinent labs & imaging results that were available during my care of the patient were reviewed by me and considered in my medical decision making (see chart for details).   Patient presents the emergency department for evaluation of continued lower abdominal pain in the setting of running out of her medications.  I reviewed the [place] drug database and she was prescribed 20 Percocet on 4/13 which is a 5-day supply.  Do not plan on additional testing here in the emergency department as the patient has a fairly well characterized reason for her pain and plan for intervention with OB/GYN.  Patient is under the impression that she was told if her pain worsens to come back to the hospital and that they may perform surgery sooner.  We will touch base with OB/GYN to discuss this further. Will give Toradol and Percocet here pending OB consult.   Discussed the case with Dr. Glo Herring who came to see the patient in the emergency department.  Agree with plan for pain medicines for home which were refilled.  Patient instructed to get additional refills from her primary care doctor or OB/GYN team moving forward.  Patient will follow with cardiology to get official clearance for surgery and Dr. Elonda Husky can arrange outpatient surgery. Discussed ED return precautions. [place] drug database reviewed prior to Rx refill.  ____________________________________________  FINAL CLINICAL IMPRESSION(S) / ED DIAGNOSES  Final diagnoses:  Lower abdominal pain     MEDICATIONS GIVEN DURING THIS VISIT:  Medications  ketorolac (TORADOL) 30 MG/ML injection 30 mg (30 mg Intramuscular Given 09/06/19 0830)  oxyCODONE-acetaminophen  (PERCOCET/ROXICET) 5-325 MG per tablet 1 tablet (1 tablet Oral Given 09/06/19 0829)    Note:  This document was prepared using Dragon voice recognition software and may include unintentional dictation errors.  Nanda Quinton, MD, [facility]    Kyrin Garn, Wonda Olds, MD 09/06/19 [phone]

## 2019-09-06 NOTE — Telephone Encounter (Signed)
FYI Pt called. She is in ED today, and will come to office for Nurse Visit when discharged from ED.   Thanks renee

## 2019-09-06 NOTE — Telephone Encounter (Signed)
Pt notified that she has been cleared for surgery.

## 2019-09-06 NOTE — Telephone Encounter (Signed)
Pt aware she is cleared as far as surgery since her BP is better. Dr. Elonda Husky will be in office tomorrow to review. [place]

## 2019-09-06 NOTE — Telephone Encounter (Signed)
BP 122/68  Nurse apt sent to B.Ahmed Prima, PA-C

## 2019-09-06 NOTE — Progress Notes (Signed)
     Given significant improvement in BP following medication adjustments, the patient is cleared to proceed with surgery from a cardiac perspective. Please make the patient aware. I will forward this to Dr. Elonda Husky.   Signed, Erma Heritage, PA-C 09/06/2019, 2:36 PM Pager: [phone]

## 2019-09-06 NOTE — Discharge Instructions (Signed)
You were seen in the emergency department today with lower abdominal pain.  We had the OB/GYN come to see you here and are sending you home with pain and nausea medications.  These were called over to your pharmacy.  Additional pain medication should come from your OB/GYN or primary care doctor and not from the emergency department.  Please return with any new or suddenly worsening symptoms such as fever, vomiting, worsening pain.

## 2019-09-06 NOTE — Telephone Encounter (Signed)
Pt aware she is cleared for surgery and Dr. Elonda Husky will be looking to get her on the schedule. Chelsey Santiago

## 2019-09-06 NOTE — Telephone Encounter (Signed)
Pt aware Dr. Elonda Husky is not in the office today. Pt's states her BP med had been changed and BP is better now. I advised cardio will have to clear her first and we would go from there. [place]

## 2019-09-06 NOTE — ED Triage Notes (Signed)
Pt reports she has a 9cm fibroid and is suppose to have a hysterectomy, but has elevated BP and cleared by cardiology. Conts to have pain and does not have any pain meds

## 2019-09-07 ENCOUNTER — Ambulatory Visit: Payer: Medicaid Other

## 2019-09-07 ENCOUNTER — Telehealth: Payer: Self-pay | Admitting: Obstetrics & Gynecology

## 2019-09-07 MED ORDER — OXYCODONE-ACETAMINOPHEN 5-325 MG PO TABS: 1.0000 | ORAL_TABLET | ORAL | 0 refills | Status: DC | PRN

## 2019-09-07 NOTE — Telephone Encounter (Signed)
Telephoned patient at home number and advised patient Percocet was called into pharmacy. Advised if takes to much will not receive pain relief post op. Patient voiced understanding.

## 2019-09-07 NOTE — Telephone Encounter (Signed)
I e prescribed some percocet but if she takes too much pre op she will not receive acceptable post op pain relief

## 2019-09-11 ENCOUNTER — Telehealth: Payer: Self-pay | Admitting: Obstetrics & Gynecology

## 2019-09-11 NOTE — Telephone Encounter (Signed)
Mailbox full @ 11:43 am. Fibroids should not be causing boils on vagina. Please advise pt of this if she calls back. Thanks!! [place]

## 2019-09-11 NOTE — Telephone Encounter (Signed)
Pt states she has a 9cm fibroid and she states Dr. Elonda Husky stated it is dying. Pt states she is starting to get boils on her vagina and is wanting to know if this is coming from the fibroid. Requesting to speak with a nurse.

## 2019-09-11 NOTE — Telephone Encounter (Signed)
Mailbox full @ 3:31 pm. JSY

## 2019-09-13 NOTE — Telephone Encounter (Signed)
Mailbox full @ 3:14 pm. 3 attempts to reach pt. No other number listed. Closing encounter. [place]

## 2019-09-17 ENCOUNTER — Telehealth: Payer: Self-pay | Admitting: Obstetrics & Gynecology

## 2019-09-17 ENCOUNTER — Other Ambulatory Visit: Payer: Self-pay | Admitting: Obstetrics & Gynecology

## 2019-09-17 ENCOUNTER — Ambulatory Visit: Payer: Medicaid Other | Admitting: Advanced Practice Midwife

## 2019-09-17 MED ORDER — OXYCODONE-ACETAMINOPHEN 5-325 MG PO TABS: 1.0000 | ORAL_TABLET | Freq: Four times a day (QID) | ORAL | 0 refills | Status: DC | PRN

## 2019-09-17 NOTE — Telephone Encounter (Signed)
Pt calling for a refill of her oxyCODONE-acetaminophen (PERCOCET/ROXICET) 5-325 MG tablet  Sent to [facility]

## 2019-09-17 NOTE — Telephone Encounter (Signed)
refilled 

## 2019-09-24 ENCOUNTER — Other Ambulatory Visit: Payer: Self-pay | Admitting: Obstetrics and Gynecology

## 2019-09-24 ENCOUNTER — Other Ambulatory Visit (HOSPITAL_COMMUNITY)
Admission: RE | Admit: 2019-09-24 | Discharge: 2019-09-24 | Disposition: A | Discharge status: Home / Self Care | Payer: Medicaid Other | Source: Ambulatory Visit | Attending: Obstetrics & Gynecology | Admitting: Obstetrics & Gynecology

## 2019-09-24 ENCOUNTER — Other Ambulatory Visit: Payer: Self-pay | Admitting: Obstetrics & Gynecology

## 2019-09-24 ENCOUNTER — Telehealth: Payer: Self-pay | Admitting: Obstetrics & Gynecology

## 2019-09-24 ENCOUNTER — Telehealth: Payer: Self-pay | Admitting: *Deleted

## 2019-09-24 ENCOUNTER — Other Ambulatory Visit: Payer: Self-pay

## 2019-09-24 ENCOUNTER — Encounter (HOSPITAL_COMMUNITY)
Admission: RE | Admit: 2019-09-24 | Discharge: 2019-09-24 | Disposition: A | Discharge status: Home / Self Care | Payer: Medicaid Other | Source: Ambulatory Visit | Attending: Obstetrics & Gynecology | Admitting: Obstetrics & Gynecology

## 2019-09-24 DIAGNOSIS — Z01812 Encounter for preprocedural laboratory examination: Secondary | ICD-10-CM | POA: Diagnosis not present

## 2019-09-24 DIAGNOSIS — Z20822 Contact with and (suspected) exposure to covid-19: Secondary | ICD-10-CM | POA: Insufficient documentation

## 2019-09-24 LAB — COMPREHENSIVE METABOLIC PANEL
ALT: 21 U/L (ref 0–44)
AST: 21 U/L (ref 15–41)
Albumin: 3.6 g/dL (ref 3.5–5.0)
Alkaline Phosphatase: 78 U/L (ref 38–126)
Anion gap: 11 (ref 5–15)
BUN: 15 mg/dL (ref 6–20)
CO2: 28 mmol/L (ref 22–32)
Calcium: 8.9 mg/dL (ref 8.9–10.3)
Chloride: 94 mmol/L — ABNORMAL LOW (ref 98–111)
Creatinine, Ser: 1.16 mg/dL — ABNORMAL HIGH (ref 0.44–1.00)
GFR calc Af Amer: >60 mL/min (ref >60)
GFR calc non Af Amer: 57 mL/min — ABNORMAL LOW (ref >60)
Glucose, Bld: 236 mg/dL — ABNORMAL HIGH (ref 70–99)
Potassium: 3 mmol/L — ABNORMAL LOW (ref 3.5–5.1)
Sodium: 133 mmol/L — ABNORMAL LOW (ref 135–145)
Total Bilirubin: 0.4 mg/dL (ref 0.3–1.2)
Total Protein: 8 g/dL (ref 6.5–8.1)

## 2019-09-24 LAB — CBC
HCT: 38.9 % (ref 36.0–46.0)
Hemoglobin: 11.8 g/dL — ABNORMAL LOW (ref 12.0–15.0)
MCH: 23.1 pg — ABNORMAL LOW (ref 26.0–34.0)
MCHC: 30.3 g/dL (ref 30.0–36.0)
MCV: 76.3 fL — ABNORMAL LOW (ref 80.0–100.0)
Platelets: 331 10*3/uL (ref 150–400)
RBC: 5.1 MIL/uL (ref 3.87–5.11)
RDW: 14.5 % (ref 11.5–15.5)
WBC: 10.7 10*3/uL — ABNORMAL HIGH (ref 4.0–10.5)
nRBC: 0 % (ref 0.0–0.2)

## 2019-09-24 LAB — HCG, QUANTITATIVE, PREGNANCY: hCG, Beta Chain, Quant, S: <1 m[IU]/mL (ref <5)

## 2019-09-24 LAB — HEMOGLOBIN A1C
Hgb A1c MFr Bld: 11.9 % — ABNORMAL HIGH (ref 4.8–5.6)
Mean Plasma Glucose: 294.83 mg/dL

## 2019-09-24 LAB — RAPID HIV SCREEN (HIV 1/2 AB+AG)
HIV 1/2 Antibodies: NONREACTIVE
HIV-1 P24 Antigen - HIV24: NONREACTIVE

## 2019-09-24 LAB — SARS CORONAVIRUS 2 (TAT 6-24 HRS): SARS Coronavirus 2: NEGATIVE

## 2019-09-24 MED ORDER — OXYCODONE-ACETAMINOPHEN 5-325 MG PO TABS: 1.0000 | ORAL_TABLET | ORAL | 0 refills | Status: DC | PRN

## 2019-09-24 NOTE — Progress Notes (Signed)
rx for an additional percocet Rx for 20 additional tablets printed out. Pt to pick up.

## 2019-09-24 NOTE — Patient Instructions (Signed)
   Your procedure is scheduled on: 09/26/2019  Report to [facility] at 6:15    AM.  Call this number if you have problems the morning of surgery: [phone]   Remember:   Do not Eat or Drink after midnight         No Smoking the morning of surgery  :  Take these medicines the morning of surgery with A SIP OF WATER: Amlodipine, Abilify, Carvedilol, Klonopin, pantoprazole, and gabapentin if needed              Take only 1/2 dose of lantus insulin 15 units the night before surgery.     Do not wear jewelry, make-up or nail polish.  Do not wear lotions, powders, or perfumes. You may wear deodorant.  Do not shave 48 hours prior to surgery. Men may shave face and neck.  Do not bring valuables to the hospital.  Contacts, dentures or bridgework may not be worn into surgery.  Leave suitcase in the car. After surgery it may be brought to your room.  For patients admitted to the hospital, checkout time is 11:00 AM the day of discharge.   Patients discharged the day of surgery will not be allowed to drive home.    Special Instructions: Shower using CHG night before surgery and shower the day of surgery use CHG.  Use special wash - you have one bottle of CHG for all showers.  You should use approximately 1/2 of the bottle for each shower.

## 2019-09-24 NOTE — Patient Instructions (Signed)
Your procedure is scheduled on: 09/26/2019  Report to [facility] at   6:15  AM.  Call this number if you have problems the morning of surgery: [phone]   Remember:   Do not Eat or Drink after midnight         No Smoking the morning of surgery  :  Take these medicines the morning of surgery with A SIP OF WATER: Amlodipine, Abilify, Carvedilol, klonopin, pantoprazole, and gabapentin.   Use inhalers if needed  Take only 1/2 dose of Lantus insulin 15 units the night before procedure   Do not wear jewelry, make-up or nail polish.  Do not wear lotions, powders, or perfumes. You may wear deodorant.  Do not shave 48 hours prior to surgery. Men may shave face and neck.  Do not bring valuables to the hospital.  Contacts, dentures or bridgework may not be worn into surgery.  Leave suitcase in the car. After surgery it may be brought to your room.  For patients admitted to the hospital, checkout time is 11:00 AM the day of discharge.   Patients discharged the day of surgery will not be allowed to drive home.    Special Instructions: Shower using CHG night before surgery and shower the day of surgery use CHG.  Use special wash - you have one bottle of CHG for all showers.  You should use approximately 1/2 of the bottle for each shower.  Hysterectomy Information  A hysterectomy is a surgery to remove your uterus. After surgery, you will no longer have periods. Also, you will no longer be able to get pregnant. Reasons for this surgery You may have this surgery if:  You have bleeding in your vagina: ? That is not normal. ? That does not stop, or that keeps coming back.  You have long-term (chronic) pain in your lower belly (pelvic area).  The lining of your uterus grows outside of the uterus (endometriosis).  The lining of your uterus grows in the muscle of the uterus (adenomyosis).  Your uterus falls down into your vagina (prolapse).  You have a growth in your uterus that causes  problems (uterine fibroids).  You have cells that could turn into cancer (precancerous cells).  You have cancer of the uterus or cervix. Types of hysterectomies There are 3 types of hysterectomies. Depending on the type, the surgery will:  Remove the top part of the uterus (supracervical).  Remove the uterus and the cervix (total).  Remove the uterus, cervix, and tissue that holds the uterus in place (radical). Ways a hysterectomy can be done This surgery may be done in one of these ways:  A cut (incision) is made in the belly (abdomen). The uterus is taken out through the cut.  A cut is made in the vagina. The uterus is taken out through the cut.  Three or four cuts are made in the belly. A device with a camera is put through one of the cuts. The uterus is cut into pieces and taken out through the cuts or the vagina.  Three or four cuts are made in the belly. A device with a camera is put through one of the cuts. The uterus is taken out through the vagina.  Three or four cuts are made in the belly. A computer helps control the surgical tools. The uterus is cut into small pieces. The pieces are taken out through the cuts or through the vagina. Talk with your doctor about which way is best for  you. Risks of hysterectomy Generally, this surgery is safe. However, problems can happen, including:  Bleeding.  Needing donated blood (transfusion).  Blood clots.  Infection.  Damage to other structures or organs.  Allergic reactions.  Needing to switch to a different type of surgery. What to expect after surgery  You will be given pain medicine.  You will need to stay in the hospital for 1-2 days.  Follow your doctor's instructions about: ? Exercising. ? Driving. ? What activities are safe for you.  You will need to have someone with you at home for 3-5 days.  You will need to see your doctor after 2-4 weeks.  You may get hot flashes, have night sweats, and have trouble  sleeping.  You may need to have Pap tests if your surgery was related to cancer. Talk with your doctor about how often you need Pap tests. Questions to ask your doctor  Do I need this surgery? Do I have other treatment options?  What are my options for this surgery?  What needs to be removed?  What are the risks?  What are the benefits?  How long will I need to stay in the hospital?  How long will I need to recover?  What symptoms can I expect after the procedure? Summary  A hysterectomy is a surgery to remove your uterus. After surgery, you will no longer have periods. Also, you will no longer be able to get pregnant.  Talk with your doctor about which type of hysterectomy is best for you. This information is not intended to replace advice given to you by your health care provider. Make sure you discuss any questions you have with your health care provider. Document Revised: 07/06/2018 Document Reviewed: 08/03/2016 Elsevier Patient Education  [place].  Abdominal Hysterectomy, Care After This sheet gives you information about how to care for yourself after your procedure. Your doctor may also give you more specific instructions. If you have problems or questions, contact your doctor. Follow these instructions at home: Bathing  Do not take baths, swim, or use a hot tub until your doctor says it is okay. Ask your doctor if you can take showers. You may only be allowed to take sponge baths for bathing.  Keep the bandage (dressing) dry until your doctor says it can be taken off. Surgical cut (incision) care      Follow instructions from your doctor about how to take care of your cut from surgery. Make sure you: ? Wash your hands with soap and water before you change your bandage (dressing). If you cannot use soap and water, use hand sanitizer. ? Change your bandage as told by your doctor. ? Leave stitches (sutures), skin glue, or skin tape (adhesive) strips in  place. They may need to stay in place for 2 weeks or longer. If tape strips get loose and curl up, you may trim the loose edges. Do not remove tape strips completely unless your doctor says it is okay.  Check your surgical cut area every day for signs of infection. Check for: ? Redness, swelling, or pain. ? Fluid or blood. ? Warmth. ? Pus or a bad smell. Activity  Do gentle, daily exercise as told by your doctor. You may be told to take short walks every day and go farther each time.  Do not lift anything that is heavier than 10 lb (4.5 kg), or the limit that your doctor tells you, until he or she says that it is  safe.  Do not drive or use heavy machinery while taking prescription pain medicine.  Do not drive for 24 hours if you were given a medicine to help you relax (sedative).  Follow your doctor's advice about exercise, driving, and general activities. Ask your doctor what activities are safe for you. Lifestyle   Do not douche, use tampons, or have sex for at least 6 weeks or as told by your doctor.  Do not drink alcohol until your doctor says it is okay.  Drink enough fluid to keep your pee (urine) clear or pale yellow.  Try to have someone at home with you for the first 1-2 weeks to help.  Do not use any products that contain nicotine or tobacco, such as cigarettes and e-cigarettes. These can slow down healing. If you need help quitting, ask your doctor. General instructions  Take over-the-counter and prescription medicines only as told by your doctor.  Do not take aspirin or ibuprofen. These medicines can cause bleeding.  To prevent or treat constipation while you are taking prescription pain medicine, your doctor may suggest that you: ? Drink enough fluid to keep your urine clear or pale yellow. ? Take over-the-counter or prescription medicines. ? Eat foods that are high in fiber, such as:  Fresh fruits and vegetables.  Whole grains.  Beans. ? Limit foods that  are high in fat and processed sugars, such as fried and sweet foods.  Keep all follow-up visits as told by your doctor. This is important. Contact a doctor if:  You have chills or fever.  You have redness, swelling, or pain around your cut.  You have fluid or blood coming from your cut.  Your cut feels warm to the touch.  You have pus or a bad smell coming from your cut.  Your cut breaks open.  You feel dizzy or light-headed.  You have pain or bleeding when you pee.  You keep having watery poop (diarrhea).  You keep feeling sick to your stomach (nauseous) or keep throwing up (vomiting).  You have unusual fluid (discharge) coming from your vagina.  You have a rash.  You have a reaction to your medicine.  Your pain medicine does not help. Get help right away if:  You have a fever and your symptoms get worse all of a sudden.  You have very bad belly (abdominal) pain.  You are short of breath.  You pass out (faint).  You have pain, swelling, or redness of your leg.  You bleed a lot from your vagina and notice clumps of blood (clots). Summary  Do not take baths, swim, or use a hot tub until your doctor says it is okay. Ask your doctor if you can take showers. You may only be allowed to take sponge baths for bathing.  Follow your doctor's advice about exercise, driving, and general activities. Ask your doctor what activities are safe for you.  Do not lift anything that is heavier than 10 lb (4.5 kg), or the limit that your doctor tells you, until he or she says that it is safe.  Try to have someone at home with you for the first 1-2 weeks to help. This information is not intended to replace advice given to you by your health care provider. Make sure you discuss any questions you have with your health care provider. Document Revised: 07/06/2018 Document Reviewed: 04/21/2016 Elsevier Patient Education  [place] Anesthesia, Adult, Care After This  sheet gives you information about  how to care for yourself after your procedure. Your health care provider may also give you more specific instructions. If you have problems or questions, contact your health care provider. What can I expect after the procedure? After the procedure, the following side effects are common:  Pain or discomfort at the IV site.  Nausea.  Vomiting.  Sore throat.  Trouble concentrating.  Feeling cold or chills.  Weak or tired.  Sleepiness and fatigue.  Soreness and body aches. These side effects can affect parts of the body that were not involved in surgery. Follow these instructions at home:  For at least 24 hours after the procedure:  Have a responsible adult stay with you. It is important to have someone help care for you until you are awake and alert.  Rest as needed.  Do not: ? Participate in activities in which you could fall or become injured. ? Drive. ? Use heavy machinery. ? Drink alcohol. ? Take sleeping pills or medicines that cause drowsiness. ? Make important decisions or sign legal documents. ? Take care of children on your own. Eating and drinking  Follow any instructions from your health care provider about eating or drinking restrictions.  When you feel hungry, start by eating small amounts of foods that are soft and easy to digest (bland), such as toast. Gradually return to your regular diet.  Drink enough fluid to keep your urine pale yellow.  If you vomit, rehydrate by drinking water, juice, or clear broth. General instructions  If you have sleep apnea, surgery and certain medicines can increase your risk for breathing problems. Follow instructions from your health care provider about wearing your sleep device: ? Anytime you are sleeping, including during daytime naps. ? While taking prescription pain medicines, sleeping medicines, or medicines that make you drowsy.  Return to your normal activities as told by your  health care provider. Ask your health care provider what activities are safe for you.  Take over-the-counter and prescription medicines only as told by your health care provider.  If you smoke, do not smoke without supervision.  Keep all follow-up visits as told by your health care provider. This is important. Contact a health care provider if:  You have nausea or vomiting that does not get better with medicine.  You cannot eat or drink without vomiting.  You have pain that does not get better with medicine.  You are unable to pass urine.  You develop a skin rash.  You have a fever.  You have redness around your IV site that gets worse. Get help right away if:  You have difficulty breathing.  You have chest pain.  You have blood in your urine or stool, or you vomit blood. Summary  After the procedure, it is common to have a sore throat or nausea. It is also common to feel tired.  Have a responsible adult stay with you for the first 24 hours after general anesthesia. It is important to have someone help care for you until you are awake and alert.  When you feel hungry, start by eating small amounts of foods that are soft and easy to digest (bland), such as toast. Gradually return to your regular diet.  Drink enough fluid to keep your urine pale yellow.  Return to your normal activities as told by your health care provider. Ask your health care provider what activities are safe for you. This information is not intended to replace advice given to you by your health care  provider. Make sure you discuss any questions you have with your health care provider. Document Revised: 05/06/2017 Document Reviewed: 12/17/2016 Elsevier Patient Education  [place].

## 2019-09-24 NOTE — Telephone Encounter (Signed)
Unable to release the pended Rx in the Computer for this pt. I was able to print a new Rx for 20 percocet for the patient. Please see if pt can come and pick up Rx.

## 2019-09-24 NOTE — Telephone Encounter (Signed)
Patient calling for refill on Percocet.  She has taken her last pill this am.  Is scheduled for surgery on Wednesday with Dr Elonda Husky for hysterectomy.

## 2019-09-24 NOTE — Telephone Encounter (Signed)
Patient called stating that she needs a refill of her percocet medication. Please contact pt

## 2019-09-26 ENCOUNTER — Inpatient Hospital Stay (HOSPITAL_COMMUNITY)
Admission: RE | Admit: 2019-09-26 | Discharge: 2019-09-27 | DRG: 742 | Disposition: A | Discharge status: Home / Self Care | Payer: Medicaid Other | Attending: Internal Medicine | Admitting: Internal Medicine

## 2019-09-26 ENCOUNTER — Inpatient Hospital Stay (HOSPITAL_COMMUNITY): Payer: Medicaid Other | Admitting: Anesthesiology

## 2019-09-26 ENCOUNTER — Other Ambulatory Visit: Payer: Self-pay

## 2019-09-26 ENCOUNTER — Encounter (HOSPITAL_COMMUNITY)
Admission: RE | Disposition: A | Discharge status: Home / Self Care | Payer: Self-pay | Source: Home / Self Care | Attending: Internal Medicine

## 2019-09-26 DIAGNOSIS — D219 Benign neoplasm of connective and other soft tissue, unspecified: Secondary | ICD-10-CM | POA: Diagnosis present

## 2019-09-26 DIAGNOSIS — E876 Hypokalemia: Secondary | ICD-10-CM | POA: Diagnosis present

## 2019-09-26 DIAGNOSIS — N72 Inflammatory disease of cervix uteri: Secondary | ICD-10-CM | POA: Diagnosis not present

## 2019-09-26 DIAGNOSIS — Z20822 Contact with and (suspected) exposure to covid-19: Secondary | ICD-10-CM | POA: Diagnosis present

## 2019-09-26 DIAGNOSIS — Z79899 Other long term (current) drug therapy: Secondary | ICD-10-CM | POA: Diagnosis not present

## 2019-09-26 DIAGNOSIS — N920 Excessive and frequent menstruation with regular cycle: Secondary | ICD-10-CM | POA: Diagnosis present

## 2019-09-26 DIAGNOSIS — Z833 Family history of diabetes mellitus: Secondary | ICD-10-CM | POA: Diagnosis not present

## 2019-09-26 DIAGNOSIS — Z7982 Long term (current) use of aspirin: Secondary | ICD-10-CM | POA: Diagnosis not present

## 2019-09-26 DIAGNOSIS — E1165 Type 2 diabetes mellitus with hyperglycemia: Secondary | ICD-10-CM | POA: Diagnosis present

## 2019-09-26 DIAGNOSIS — Z83438 Family history of other disorder of lipoprotein metabolism and other lipidemia: Secondary | ICD-10-CM

## 2019-09-26 DIAGNOSIS — D179 Benign lipomatous neoplasm, unspecified: Secondary | ICD-10-CM | POA: Diagnosis present

## 2019-09-26 DIAGNOSIS — Z8249 Family history of ischemic heart disease and other diseases of the circulatory system: Secondary | ICD-10-CM

## 2019-09-26 DIAGNOSIS — I110 Hypertensive heart disease with heart failure: Secondary | ICD-10-CM | POA: Diagnosis present

## 2019-09-26 DIAGNOSIS — D259 Leiomyoma of uterus, unspecified: Principal | ICD-10-CM | POA: Diagnosis present

## 2019-09-26 DIAGNOSIS — F329 Major depressive disorder, single episode, unspecified: Secondary | ICD-10-CM | POA: Diagnosis present

## 2019-09-26 DIAGNOSIS — N800 Endometriosis of uterus: Secondary | ICD-10-CM

## 2019-09-26 DIAGNOSIS — E785 Hyperlipidemia, unspecified: Secondary | ICD-10-CM | POA: Diagnosis present

## 2019-09-26 DIAGNOSIS — I5042 Chronic combined systolic (congestive) and diastolic (congestive) heart failure: Secondary | ICD-10-CM | POA: Diagnosis present

## 2019-09-26 DIAGNOSIS — F419 Anxiety disorder, unspecified: Secondary | ICD-10-CM | POA: Diagnosis present

## 2019-09-26 DIAGNOSIS — E11649 Type 2 diabetes mellitus with hypoglycemia without coma: Secondary | ICD-10-CM | POA: Diagnosis present

## 2019-09-26 DIAGNOSIS — D62 Acute posthemorrhagic anemia: Secondary | ICD-10-CM | POA: Diagnosis present

## 2019-09-26 DIAGNOSIS — N946 Dysmenorrhea, unspecified: Secondary | ICD-10-CM

## 2019-09-26 DIAGNOSIS — Z794 Long term (current) use of insulin: Secondary | ICD-10-CM

## 2019-09-26 DIAGNOSIS — I428 Other cardiomyopathies: Secondary | ICD-10-CM

## 2019-09-26 DIAGNOSIS — E66812 Obesity, class 2: Secondary | ICD-10-CM | POA: Diagnosis present

## 2019-09-26 DIAGNOSIS — D649 Anemia, unspecified: Secondary | ICD-10-CM

## 2019-09-26 DIAGNOSIS — M1710 Unilateral primary osteoarthritis, unspecified knee: Secondary | ICD-10-CM | POA: Diagnosis present

## 2019-09-26 DIAGNOSIS — L918 Other hypertrophic disorders of the skin: Secondary | ICD-10-CM

## 2019-09-26 DIAGNOSIS — R102 Pelvic and perineal pain: Secondary | ICD-10-CM

## 2019-09-26 DIAGNOSIS — Z90710 Acquired absence of both cervix and uterus: Secondary | ICD-10-CM | POA: Diagnosis present

## 2019-09-26 HISTORY — PX: HYSTERECTOMY ABDOMINAL WITH SALPINGECTOMY: SHX6725

## 2019-09-26 HISTORY — PX: LIPOMA EXCISION: SHX5283

## 2019-09-26 LAB — HEMOGLOBIN AND HEMATOCRIT, BLOOD
HCT: 36.4 % (ref 36.0–46.0)
HCT: 35.8 % — ABNORMAL LOW (ref 36.0–46.0)
Hemoglobin: 11.4 g/dL — ABNORMAL LOW (ref 12.0–15.0)
Hemoglobin: 11.2 g/dL — ABNORMAL LOW (ref 12.0–15.0)

## 2019-09-26 LAB — CBC WITH DIFFERENTIAL/PLATELET
Abs Immature Granulocytes: 0.08 10*3/uL — ABNORMAL HIGH (ref 0.00–0.07)
Basophils Absolute: 0.1 10*3/uL (ref 0.0–0.1)
Basophils Relative: 1 %
Eosinophils Absolute: 0.1 10*3/uL (ref 0.0–0.5)
Eosinophils Relative: 1 %
HCT: 35.5 % — ABNORMAL LOW (ref 36.0–46.0)
Hemoglobin: 10.9 g/dL — ABNORMAL LOW (ref 12.0–15.0)
Immature Granulocytes: 1 %
Lymphocytes Relative: 15 %
Lymphs Abs: 2.2 10*3/uL (ref 0.7–4.0)
MCH: 25.3 pg — ABNORMAL LOW (ref 26.0–34.0)
MCHC: 30.7 g/dL (ref 30.0–36.0)
MCV: 82.6 fL (ref 80.0–100.0)
Monocytes Absolute: 0.9 10*3/uL (ref 0.1–1.0)
Monocytes Relative: 6 %
Neutro Abs: 11.6 10*3/uL — ABNORMAL HIGH (ref 1.7–7.7)
Neutrophils Relative %: 76 %
Platelets: 237 10*3/uL (ref 150–400)
RBC: 4.3 MIL/uL (ref 3.87–5.11)
RDW: 15.9 % — ABNORMAL HIGH (ref 11.5–15.5)
WBC: 14.9 10*3/uL — ABNORMAL HIGH (ref 4.0–10.5)
nRBC: 0 % (ref 0.0–0.2)

## 2019-09-26 LAB — POCT I-STAT, CHEM 8
BUN: 16 mg/dL (ref 6–20)
Calcium, Ion: 1.16 mmol/L (ref 1.15–1.40)
Chloride: 101 mmol/L (ref 98–111)
Creatinine, Ser: 0.9 mg/dL (ref 0.44–1.00)
Glucose, Bld: 88 mg/dL (ref 70–99)
HCT: 35 % — ABNORMAL LOW (ref 36.0–46.0)
Hemoglobin: 11.9 g/dL — ABNORMAL LOW (ref 12.0–15.0)
Potassium: 3.4 mmol/L — ABNORMAL LOW (ref 3.5–5.1)
Sodium: 140 mmol/L (ref 135–145)
TCO2: 30 mmol/L (ref 22–32)

## 2019-09-26 LAB — PREPARE RBC (CROSSMATCH)

## 2019-09-26 LAB — MAGNESIUM: Magnesium: 1.5 mg/dL — ABNORMAL LOW (ref 1.7–2.4)

## 2019-09-26 LAB — BASIC METABOLIC PANEL
Anion gap: 7 (ref 5–15)
BUN: 16 mg/dL (ref 6–20)
CO2: 27 mmol/L (ref 22–32)
Calcium: 7.9 mg/dL — ABNORMAL LOW (ref 8.9–10.3)
Chloride: 104 mmol/L (ref 98–111)
Creatinine, Ser: 0.9 mg/dL (ref 0.44–1.00)
GFR calc Af Amer: >60 mL/min (ref >60)
GFR calc non Af Amer: >60 mL/min (ref >60)
Glucose, Bld: 93 mg/dL (ref 70–99)
Potassium: 3.9 mmol/L (ref 3.5–5.1)
Sodium: 138 mmol/L (ref 135–145)

## 2019-09-26 LAB — GLUCOSE, CAPILLARY
Glucose-Capillary: 148 mg/dL — ABNORMAL HIGH (ref 70–99)
Glucose-Capillary: 135 mg/dL — ABNORMAL HIGH (ref 70–99)
Glucose-Capillary: 134 mg/dL — ABNORMAL HIGH (ref 70–99)

## 2019-09-26 LAB — ABO/RH: ABO/RH(D): O NEG

## 2019-09-26 SURGERY — HYSTERECTOMY, TOTAL, ABDOMINAL, WITH SALPINGECTOMY
Anesthesia: General | Site: Abdomen | Laterality: Right

## 2019-09-26 MED ORDER — IPRATROPIUM-ALBUTEROL 20-100 MCG/ACT IN AERS: 1.0000 | INHALATION_SPRAY | Freq: Four times a day (QID) | RESPIRATORY_TRACT | Status: DC | PRN

## 2019-09-26 MED ORDER — CLONAZEPAM 0.5 MG PO TABS
0.5000 mg | ORAL_TABLET | Freq: Two times a day (BID) | ORAL | Status: DC
  Administered 2019-09-26 – 2019-09-27 (×2): 0.5 mg via ORAL
  Filled 2019-09-26 (×2): qty 1

## 2019-09-26 MED ORDER — MIDAZOLAM HCL 2 MG/2ML IJ SOLN
INTRAMUSCULAR | Status: AC
  Filled 2019-09-26: qty 2

## 2019-09-26 MED ORDER — ONDANSETRON HCL 4 MG PO TABS
8.0000 mg | ORAL_TABLET | Freq: Three times a day (TID) | ORAL | Status: DC | PRN
  Administered 2019-09-27: 8 mg via ORAL
  Filled 2019-09-26 (×2): qty 2

## 2019-09-26 MED ORDER — ROCURONIUM BROMIDE 10 MG/ML (PF) SYRINGE
PREFILLED_SYRINGE | INTRAVENOUS | Status: AC
  Filled 2019-09-26: qty 20

## 2019-09-26 MED ORDER — CARVEDILOL 12.5 MG PO TABS
50.0000 mg | ORAL_TABLET | Freq: Two times a day (BID) | ORAL | Status: DC
  Administered 2019-09-27: 50 mg via ORAL
  Filled 2019-09-26 (×2): qty 4

## 2019-09-26 MED ORDER — LIDOCAINE 2% (20 MG/ML) 5 ML SYRINGE
INTRAMUSCULAR | Status: AC
  Filled 2019-09-26: qty 10

## 2019-09-26 MED ORDER — SODIUM CHLORIDE (PF) 0.9 % IJ SOLN
INTRAMUSCULAR | Status: AC
  Filled 2019-09-26: qty 20

## 2019-09-26 MED ORDER — LACTATED RINGERS IV SOLN: INTRAVENOUS | Status: DC | PRN

## 2019-09-26 MED ORDER — INSULIN ASPART 100 UNIT/ML ~~LOC~~ SOLN
0.0000 [IU] | Freq: Three times a day (TID) | SUBCUTANEOUS | Status: DC
  Administered 2019-09-26: 3 [IU] via SUBCUTANEOUS
  Administered 2019-09-27 (×2): 4 [IU] via SUBCUTANEOUS

## 2019-09-26 MED ORDER — HYDROMORPHONE HCL 1 MG/ML IJ SOLN
INTRAMUSCULAR | Status: AC
  Filled 2019-09-26: qty 1

## 2019-09-26 MED ORDER — ALBUTEROL SULFATE HFA 108 (90 BASE) MCG/ACT IN AERS: 1.0000 | INHALATION_SPRAY | Freq: Four times a day (QID) | RESPIRATORY_TRACT | Status: DC | PRN

## 2019-09-26 MED ORDER — 0.9 % SODIUM CHLORIDE (POUR BTL) OPTIME
TOPICAL | Status: DC | PRN
  Administered 2019-09-26 (×3): 1000 mL

## 2019-09-26 MED ORDER — PROPOFOL 10 MG/ML IV BOLUS
INTRAVENOUS | Status: DC | PRN
  Administered 2019-09-26: 150 mg via INTRAVENOUS
  Administered 2019-09-26: 50 mg via INTRAVENOUS

## 2019-09-26 MED ORDER — PROPOFOL 10 MG/ML IV BOLUS
INTRAVENOUS | Status: AC
  Filled 2019-09-26: qty 20

## 2019-09-26 MED ORDER — ATORVASTATIN CALCIUM 20 MG PO TABS
20.0000 mg | ORAL_TABLET | Freq: Every day | ORAL | Status: DC
  Administered 2019-09-26 – 2019-09-27 (×2): 20 mg via ORAL
  Filled 2019-09-26 (×2): qty 1

## 2019-09-26 MED ORDER — PHENYLEPHRINE 40 MCG/ML (10ML) SYRINGE FOR IV PUSH (FOR BLOOD PRESSURE SUPPORT)
PREFILLED_SYRINGE | INTRAVENOUS | Status: AC
  Filled 2019-09-26: qty 40

## 2019-09-26 MED ORDER — MAGNESIUM SULFATE 2 GM/50ML IV SOLN
2.0000 g | Freq: Once | INTRAVENOUS | Status: AC
  Administered 2019-09-26: 20:00:00 2 g via INTRAVENOUS
  Filled 2019-09-26: qty 50

## 2019-09-26 MED ORDER — MAGNESIUM OXIDE 400 (241.3 MG) MG PO TABS
400.0000 mg | ORAL_TABLET | Freq: Two times a day (BID) | ORAL | Status: DC
  Administered 2019-09-26 – 2019-09-27 (×2): 400 mg via ORAL
  Filled 2019-09-26 (×2): qty 1

## 2019-09-26 MED ORDER — KETOROLAC TROMETHAMINE 30 MG/ML IJ SOLN
30.0000 mg | Freq: Once | INTRAMUSCULAR | Status: AC
  Administered 2019-09-26: 07:00:00 30 mg via INTRAVENOUS
  Filled 2019-09-26: qty 1

## 2019-09-26 MED ORDER — PANTOPRAZOLE SODIUM 40 MG PO TBEC
40.0000 mg | DELAYED_RELEASE_TABLET | Freq: Two times a day (BID) | ORAL | Status: DC
  Administered 2019-09-26 – 2019-09-27 (×3): 40 mg via ORAL
  Filled 2019-09-26 (×3): qty 1

## 2019-09-26 MED ORDER — TORSEMIDE 20 MG PO TABS
40.0000 mg | ORAL_TABLET | Freq: Every day | ORAL | Status: DC
  Administered 2019-09-27: 40 mg via ORAL
  Filled 2019-09-26: qty 2

## 2019-09-26 MED ORDER — PROMETHAZINE HCL 25 MG/ML IJ SOLN
25.0000 mg | Freq: Four times a day (QID) | INTRAMUSCULAR | Status: DC | PRN
  Administered 2019-09-26 (×2): 25 mg via INTRAVENOUS
  Filled 2019-09-26 (×2): qty 1

## 2019-09-26 MED ORDER — ENOXAPARIN SODIUM 40 MG/0.4ML ~~LOC~~ SOLN
40.0000 mg | SUBCUTANEOUS | Status: DC
  Administered 2019-09-27: 40 mg via SUBCUTANEOUS
  Filled 2019-09-26: qty 0.4

## 2019-09-26 MED ORDER — MIDAZOLAM HCL 5 MG/5ML IJ SOLN
INTRAMUSCULAR | Status: DC | PRN
  Administered 2019-09-26: 2 mg via INTRAVENOUS

## 2019-09-26 MED ORDER — FUROSEMIDE 10 MG/ML IJ SOLN
INTRAMUSCULAR | Status: DC | PRN
  Administered 2019-09-26: 20 mg via INTRAMUSCULAR

## 2019-09-26 MED ORDER — PHENYLEPHRINE HCL (PRESSORS) 10 MG/ML IV SOLN
INTRAVENOUS | Status: AC
  Filled 2019-09-26: qty 1

## 2019-09-26 MED ORDER — FENTANYL CITRATE (PF) 100 MCG/2ML IJ SOLN
INTRAMUSCULAR | Status: DC | PRN
  Administered 2019-09-26 (×5): 50 ug via INTRAVENOUS

## 2019-09-26 MED ORDER — PHENYLEPHRINE HCL (PRESSORS) 10 MG/ML IV SOLN
INTRAVENOUS | Status: DC | PRN
  Administered 2019-09-26: 80 ug via INTRAVENOUS
  Administered 2019-09-26: 100 ug via INTRAVENOUS
  Administered 2019-09-26 (×2): 80 ug via INTRAVENOUS
  Administered 2019-09-26 (×3): 100 ug via INTRAVENOUS

## 2019-09-26 MED ORDER — PROMETHAZINE HCL 25 MG/ML IJ SOLN
6.2500 mg | INTRAMUSCULAR | Status: DC | PRN
  Administered 2019-09-26: 11:00:00 6.25 mg via INTRAVENOUS
  Filled 2019-09-26: qty 1

## 2019-09-26 MED ORDER — PHENYLEPHRINE HCL-NACL 10-0.9 MG/250ML-% IV SOLN
INTRAVENOUS | Status: DC | PRN
  Administered 2019-09-26: 40 ug/min via INTRAVENOUS

## 2019-09-26 MED ORDER — HYDRALAZINE HCL 25 MG PO TABS
100.0000 mg | ORAL_TABLET | Freq: Three times a day (TID) | ORAL | Status: DC
  Administered 2019-09-26 – 2019-09-27 (×2): 100 mg via ORAL
  Filled 2019-09-26 (×3): qty 4

## 2019-09-26 MED ORDER — KETOROLAC TROMETHAMINE 30 MG/ML IJ SOLN
30.0000 mg | Freq: Four times a day (QID) | INTRAMUSCULAR | Status: AC
  Administered 2019-09-26 – 2019-09-27 (×4): 30 mg via INTRAVENOUS
  Filled 2019-09-26 (×4): qty 1

## 2019-09-26 MED ORDER — FENTANYL CITRATE (PF) 250 MCG/5ML IJ SOLN
INTRAMUSCULAR | Status: AC
  Filled 2019-09-26: qty 5

## 2019-09-26 MED ORDER — SCOPOLAMINE 1 MG/3DAYS TD PT72
1.0000 | MEDICATED_PATCH | Freq: Once | TRANSDERMAL | Status: DC
  Administered 2019-09-26: 07:00:00 1.5 mg via TRANSDERMAL
  Filled 2019-09-26: qty 1

## 2019-09-26 MED ORDER — SENNOSIDES-DOCUSATE SODIUM 8.6-50 MG PO TABS: 1.0000 | ORAL_TABLET | Freq: Every evening | ORAL | Status: DC | PRN

## 2019-09-26 MED ORDER — ZOLPIDEM TARTRATE 5 MG PO TABS: 5.0000 mg | ORAL_TABLET | Freq: Every evening | ORAL | Status: DC | PRN

## 2019-09-26 MED ORDER — INSULIN GLARGINE 100 UNIT/ML ~~LOC~~ SOLN
30.0000 [IU] | Freq: Every day | SUBCUTANEOUS | Status: DC
  Filled 2019-09-26: qty 0.3

## 2019-09-26 MED ORDER — FLUTICASONE FUROATE-VILANTEROL 100-25 MCG/INH IN AEPB: 1.0000 | INHALATION_SPRAY | Freq: Every day | RESPIRATORY_TRACT | Status: DC

## 2019-09-26 MED ORDER — SODIUM CHLORIDE 0.9 % IV SOLN
INTRAVENOUS | Status: DC | PRN
  Administered 2019-09-26: 40 mL

## 2019-09-26 MED ORDER — SODIUM CHLORIDE 0.9% IV SOLUTION: Freq: Once | INTRAVENOUS | Status: DC

## 2019-09-26 MED ORDER — INSULIN ASPART 100 UNIT/ML ~~LOC~~ SOLN: 15.0000 [IU] | Freq: Three times a day (TID) | SUBCUTANEOUS | Status: DC

## 2019-09-26 MED ORDER — NITROGLYCERIN 0.4 MG SL SUBL: 0.4000 mg | SUBLINGUAL_TABLET | SUBLINGUAL | Status: DC | PRN

## 2019-09-26 MED ORDER — EPHEDRINE SULFATE 50 MG/ML IJ SOLN
INTRAMUSCULAR | Status: DC | PRN
  Administered 2019-09-26: 10 mg via INTRAVENOUS

## 2019-09-26 MED ORDER — AMLODIPINE BESYLATE 5 MG PO TABS
10.0000 mg | ORAL_TABLET | Freq: Every day | ORAL | Status: DC
  Administered 2019-09-27: 10 mg via ORAL
  Filled 2019-09-26: qty 2

## 2019-09-26 MED ORDER — ARIPIPRAZOLE 10 MG PO TABS
10.0000 mg | ORAL_TABLET | Freq: Every day | ORAL | Status: DC
  Administered 2019-09-27: 10 mg via ORAL
  Filled 2019-09-26: qty 1

## 2019-09-26 MED ORDER — LACTATED RINGERS IV SOLN
Freq: Once | INTRAVENOUS | Status: AC
  Administered 2019-09-26: 07:00:00 1000 mL via INTRAVENOUS

## 2019-09-26 MED ORDER — ROCURONIUM BROMIDE 100 MG/10ML IV SOLN
INTRAVENOUS | Status: DC | PRN
  Administered 2019-09-26: 20 mg via INTRAVENOUS
  Administered 2019-09-26: 10 mg via INTRAVENOUS
  Administered 2019-09-26: 50 mg via INTRAVENOUS

## 2019-09-26 MED ORDER — CHLORHEXIDINE GLUCONATE CLOTH 2 % EX PADS
6.0000 | MEDICATED_PAD | Freq: Every day | CUTANEOUS | Status: DC
  Administered 2019-09-27: 6 via TOPICAL

## 2019-09-26 MED ORDER — EPHEDRINE 5 MG/ML INJ
INTRAVENOUS | Status: AC
  Filled 2019-09-26: qty 20

## 2019-09-26 MED ORDER — LIDOCAINE HCL (CARDIAC) PF 100 MG/5ML IV SOSY
PREFILLED_SYRINGE | INTRAVENOUS | Status: DC | PRN
  Administered 2019-09-26: 60 mg via INTRAVENOUS
  Administered 2019-09-26: 100 mg via INTRAVENOUS

## 2019-09-26 MED ORDER — MIDAZOLAM HCL 2 MG/2ML IJ SOLN
2.0000 mg | Freq: Once | INTRAMUSCULAR | Status: DC
  Filled 2019-09-26: qty 2

## 2019-09-26 MED ORDER — OXYCODONE HCL 5 MG PO TABS
5.0000 mg | ORAL_TABLET | ORAL | Status: DC | PRN
  Administered 2019-09-27: 5 mg via ORAL
  Filled 2019-09-26: qty 1

## 2019-09-26 MED ORDER — BISACODYL 10 MG RE SUPP: 10.0000 mg | Freq: Every day | RECTAL | Status: DC | PRN

## 2019-09-26 MED ORDER — ASPIRIN EC 81 MG PO TBEC
81.0000 mg | DELAYED_RELEASE_TABLET | Freq: Every day | ORAL | Status: DC
  Administered 2019-09-27: 81 mg via ORAL
  Filled 2019-09-26: qty 1

## 2019-09-26 MED ORDER — SACUBITRIL-VALSARTAN 97-103 MG PO TABS
1.0000 | ORAL_TABLET | Freq: Two times a day (BID) | ORAL | Status: DC
  Administered 2019-09-26 – 2019-09-27 (×2): 1 via ORAL
  Filled 2019-09-26 (×4): qty 1

## 2019-09-26 MED ORDER — HYDROMORPHONE HCL 1 MG/ML IJ SOLN
INTRAMUSCULAR | Status: DC | PRN
  Administered 2019-09-26 (×2): .5 mg via INTRAVENOUS

## 2019-09-26 MED ORDER — INSULIN GLARGINE 100 UNIT/ML ~~LOC~~ SOLN
15.0000 [IU] | Freq: Every day | SUBCUTANEOUS | Status: DC
  Administered 2019-09-26: 21:00:00 15 [IU] via SUBCUTANEOUS
  Filled 2019-09-26 (×2): qty 0.15

## 2019-09-26 MED ORDER — MEPERIDINE HCL 50 MG/ML IJ SOLN: 6.2500 mg | INTRAMUSCULAR | Status: DC | PRN

## 2019-09-26 MED ORDER — DOCUSATE SODIUM 100 MG PO CAPS
100.0000 mg | ORAL_CAPSULE | Freq: Two times a day (BID) | ORAL | Status: DC
  Administered 2019-09-26 – 2019-09-27 (×2): 100 mg via ORAL
  Filled 2019-09-26 (×3): qty 1

## 2019-09-26 MED ORDER — PYRIDOSTIGMINE BROMIDE 60 MG PO TABS
60.0000 mg | ORAL_TABLET | Freq: Three times a day (TID) | ORAL | Status: DC
  Administered 2019-09-26 – 2019-09-27 (×3): 60 mg via ORAL
  Filled 2019-09-26 (×4): qty 1

## 2019-09-26 MED ORDER — FENTANYL CITRATE (PF) 100 MCG/2ML IJ SOLN
50.0000 ug | INTRAMUSCULAR | Status: DC | PRN
  Administered 2019-09-26: 20:00:00 100 ug via INTRAVENOUS
  Filled 2019-09-26 (×2): qty 2

## 2019-09-26 MED ORDER — CEFAZOLIN SODIUM-DEXTROSE 2-4 GM/100ML-% IV SOLN
2.0000 g | INTRAVENOUS | Status: AC
  Administered 2019-09-26: 08:00:00 2 g via INTRAVENOUS
  Filled 2019-09-26: qty 100

## 2019-09-26 MED ORDER — BUPIVACAINE LIPOSOME 1.3 % IJ SUSP
INTRAMUSCULAR | Status: AC
  Filled 2019-09-26: qty 10

## 2019-09-26 MED ORDER — IBUPROFEN 800 MG PO TABS
800.0000 mg | ORAL_TABLET | Freq: Four times a day (QID) | ORAL | Status: DC
  Administered 2019-09-27 (×2): 800 mg via ORAL
  Filled 2019-09-26 (×2): qty 1

## 2019-09-26 MED ORDER — BUPIVACAINE LIPOSOME 1.3 % IJ SUSP
20.0000 mL | Freq: Once | INTRAMUSCULAR | Status: DC
  Filled 2019-09-26: qty 20

## 2019-09-26 MED ORDER — HYDROMORPHONE HCL 1 MG/ML IJ SOLN
0.2500 mg | INTRAMUSCULAR | Status: DC | PRN
  Administered 2019-09-26 (×4): 0.5 mg via INTRAVENOUS
  Filled 2019-09-26 (×4): qty 0.5

## 2019-09-26 MED ORDER — SODIUM CHLORIDE 0.9 % IV SOLN
INTRAVENOUS | Status: DC | PRN
Start: 2019-09-26 — End: 2019-09-26

## 2019-09-26 MED ORDER — POTASSIUM CHLORIDE CRYS ER 20 MEQ PO TBCR
40.0000 meq | EXTENDED_RELEASE_TABLET | Freq: Every day | ORAL | Status: DC
  Administered 2019-09-26 – 2019-09-27 (×2): 40 meq via ORAL
  Filled 2019-09-26 (×2): qty 2

## 2019-09-26 MED ORDER — TRAZODONE HCL 50 MG PO TABS
200.0000 mg | ORAL_TABLET | Freq: Every day | ORAL | Status: DC
  Administered 2019-09-26: 21:00:00 200 mg via ORAL
  Filled 2019-09-26: qty 4

## 2019-09-26 MED ORDER — INSULIN ASPART 100 UNIT/ML ~~LOC~~ SOLN: 0.0000 [IU] | Freq: Every day | SUBCUTANEOUS | Status: DC

## 2019-09-26 MED ORDER — DIPHENHYDRAMINE HCL 50 MG/ML IJ SOLN: 25.0000 mg | Freq: Four times a day (QID) | INTRAMUSCULAR | Status: DC | PRN

## 2019-09-26 MED ORDER — GABAPENTIN 300 MG PO CAPS
600.0000 mg | ORAL_CAPSULE | Freq: Three times a day (TID) | ORAL | Status: DC
  Administered 2019-09-26 – 2019-09-27 (×3): 600 mg via ORAL
  Filled 2019-09-26 (×4): qty 2

## 2019-09-26 MED ORDER — LINACLOTIDE 145 MCG PO CAPS
145.0000 ug | ORAL_CAPSULE | Freq: Every day | ORAL | Status: DC
  Administered 2019-09-27: 145 ug via ORAL
  Filled 2019-09-26: qty 1

## 2019-09-26 MED ORDER — SODIUM CHLORIDE 0.9 % IV SOLN
8.0000 mg | Freq: Four times a day (QID) | INTRAVENOUS | Status: DC | PRN
  Filled 2019-09-26: qty 4

## 2019-09-26 MED ORDER — LACTATED RINGERS IV SOLN
INTRAVENOUS | Status: DC | PRN
Start: 2019-09-26 — End: 2019-09-26

## 2019-09-26 MED ORDER — ALUM & MAG HYDROXIDE-SIMETH 200-200-20 MG/5ML PO SUSP: 30.0000 mL | ORAL | Status: DC | PRN

## 2019-09-26 MED ORDER — FUROSEMIDE 10 MG/ML IJ SOLN
INTRAMUSCULAR | Status: AC
  Filled 2019-09-26: qty 4

## 2019-09-26 MED ORDER — HEMOSTATIC AGENTS (NO CHARGE) OPTIME
TOPICAL | Status: DC | PRN
  Administered 2019-09-26: 1 via TOPICAL

## 2019-09-26 MED ORDER — SODIUM CHLORIDE 0.9 % IV SOLN
3.0000 g | Freq: Four times a day (QID) | INTRAVENOUS | Status: AC
  Administered 2019-09-26 – 2019-09-27 (×3): 3 g via INTRAVENOUS
  Filled 2019-09-26 (×4): qty 8

## 2019-09-26 MED ORDER — SUGAMMADEX SODIUM 200 MG/2ML IV SOLN
INTRAVENOUS | Status: DC | PRN
Start: 2019-09-26 — End: 2019-09-26
  Administered 2019-09-26: 200 mg via INTRAVENOUS

## 2019-09-26 MED ORDER — MOMETASONE FURO-FORMOTEROL FUM 100-5 MCG/ACT IN AERO
2.0000 | INHALATION_SPRAY | Freq: Two times a day (BID) | RESPIRATORY_TRACT | Status: DC
  Administered 2019-09-27: 2 via RESPIRATORY_TRACT
  Filled 2019-09-26: qty 8.8

## 2019-09-26 SURGICAL SUPPLY — 54 items
APPLICATOR COTTON TIP 6 STRL (MISCELLANEOUS) ×4 IMPLANT
APPLICATOR COTTON TIP 6IN STRL (MISCELLANEOUS) ×6
APPLIER CLIP 13 LRG OPEN (CLIP) ×6
BAG HAMPER (MISCELLANEOUS) ×6 IMPLANT
BLADE 15 SAFETY STRL DISP (BLADE) ×6 IMPLANT
BLADE SURG SZ10 CARB STEEL (BLADE) ×6 IMPLANT
BRUSH SCRUB SURG 4.25 DISP (MISCELLANEOUS) ×6 IMPLANT
CLIP APPLIE 13 LRG OPEN (CLIP) ×4 IMPLANT
CLOTH BEACON ORANGE TIMEOUT ST (SAFETY) ×6 IMPLANT
COVER LIGHT HANDLE STERIS (MISCELLANEOUS) ×12 IMPLANT
COVER WAND RF STERILE (DRAPES) ×6 IMPLANT
DERMABOND ADVANCED (GAUZE/BANDAGES/DRESSINGS) ×2
DERMABOND ADVANCED .7 DNX12 (GAUZE/BANDAGES/DRESSINGS) ×4 IMPLANT
DRAPE WARM FLUID 44X44 (DRAPES) ×6 IMPLANT
DRSG OPSITE POSTOP 4X10 (GAUZE/BANDAGES/DRESSINGS) ×6 IMPLANT
ELECT REM PT RETURN 9FT ADLT (ELECTROSURGICAL) ×6
ELECTRODE REM PT RTRN 9FT ADLT (ELECTROSURGICAL) ×4 IMPLANT
GAUZE 4X4 16PLY RFD (DISPOSABLE) ×6 IMPLANT
GLOVE BIO SURGEON STRL SZ7 (GLOVE) ×12 IMPLANT
GLOVE BIOGEL PI IND STRL 7.0 (GLOVE) ×16 IMPLANT
GLOVE BIOGEL PI IND STRL 8 (GLOVE) ×4 IMPLANT
GLOVE BIOGEL PI INDICATOR 7.0 (GLOVE) ×8
GLOVE BIOGEL PI INDICATOR 8 (GLOVE) ×2
GLOVE ECLIPSE 8.0 STRL XLNG CF (GLOVE) ×6 IMPLANT
GOWN STRL REUS W/TWL LRG LVL3 (GOWN DISPOSABLE) ×12 IMPLANT
GOWN STRL REUS W/TWL XL LVL3 (GOWN DISPOSABLE) ×6 IMPLANT
HEMOSTAT ARISTA ABSORB 3G PWDR (HEMOSTASIS) ×6 IMPLANT
INST SET MAJOR GENERAL (KITS) ×6 IMPLANT
KIT BLADEGUARD II DBL (SET/KITS/TRAYS/PACK) ×12 IMPLANT
KIT TURNOVER KIT A (KITS) ×6 IMPLANT
MANIFOLD NEPTUNE II (INSTRUMENTS) ×6 IMPLANT
NEEDLE HYPO 18GX1.5 BLUNT FILL (NEEDLE) ×6 IMPLANT
NEEDLE HYPO 22GX1.5 SAFETY (NEEDLE) ×6 IMPLANT
NS IRRIG 1000ML POUR BTL (IV SOLUTION) ×18 IMPLANT
PACK MAJOR ABDOMINAL (CUSTOM PROCEDURE TRAY) ×6 IMPLANT
PAD ARMBOARD 7.5X6 YLW CONV (MISCELLANEOUS) ×6 IMPLANT
PENCIL SMOKE EVACUATOR (MISCELLANEOUS) ×6 IMPLANT
RETRACTOR WND ALEXIS 25 LRG (MISCELLANEOUS) ×4 IMPLANT
RETRACTOR WND ALEXIS-O 25 LRG (MISCELLANEOUS) ×4 IMPLANT
RTRCTR WOUND ALEXIS 25CM LRG (MISCELLANEOUS) ×6
RTRCTR WOUND ALEXIS O 25CM LRG (MISCELLANEOUS) ×6
SET BASIN LINEN APH (SET/KITS/TRAYS/PACK) ×6 IMPLANT
SPONGE LAP 18X18 RF (DISPOSABLE) ×18 IMPLANT
SUT CHROMIC 0 CT 1 (SUTURE) ×12 IMPLANT
SUT MON AB 0 CT1 (SUTURE) ×6 IMPLANT
SUT MON AB 3-0 SH 27 (SUTURE) ×12 IMPLANT
SUT VIC AB 0 CT1 27 (SUTURE) ×36
SUT VIC AB 0 CT1 27XCR 8 STRN (SUTURE) ×24 IMPLANT
SUT VIC AB 0 CTX 36 (SUTURE) ×6
SUT VIC AB 0 CTX36XBRD ANTBCTR (SUTURE) ×4 IMPLANT
SUT VICRYL 3 0 (SUTURE) ×6 IMPLANT
SYR 20ML LL LF (SYRINGE) ×12 IMPLANT
TOWEL SURG RFD BLUE STRL DISP (DISPOSABLE) ×6 IMPLANT
TRAY FOLEY MTR SLVR 16FR STAT (SET/KITS/TRAYS/PACK) ×6 IMPLANT

## 2019-09-26 NOTE — Transfer of Care (Signed)
Immediate Anesthesia Transfer of Care Note  Patient: Chelsey Santiago  Procedure(s) Performed: HYSTERECTOMY ABDOMINAL WITH SALPINGECTOMY (N/A Abdomen) EXCISION LIPOMA RIGHT VULVAR (Right )  Patient Location: PACU  Anesthesia Type:General  Level of Consciousness: awake, alert , oriented and patient cooperative  Airway & Oxygen Therapy: Patient Spontanous Breathing and Patient connected to face mask oxygen  Post-op Assessment: Report given to RN, Post -op Vital signs reviewed and stable and Patient moving all extremities X 4  Post vital signs: Reviewed and stable  Last Vitals:  Vitals Value Taken Time  BP 120/86 09/26/19 1101  Temp    Pulse 74 09/26/19 1105  Resp 17 09/26/19 1105  SpO2 100 % 09/26/19 1105  Vitals shown include unvalidated device data.  Last Pain:  Vitals:   09/26/19 0702  TempSrc: Oral  PainSc: 8       Patients Stated Pain Goal: 8 ([id number])  Complications: No apparent anesthesia complications

## 2019-09-26 NOTE — H&P (Signed)
History and Physical  Chelsey Santiago [id number] DOB: 1976/03/21 DOA: 09/26/2019   PCP: Rosita Fire, MD   Patient coming from: Home  Chief Complaint: operative blood loss  HPI:  Chelsey Santiago is a 44 y.o. female with medical history of diabetes mellitus type 2, systolic and diastolic CHF, nonischemic cardiomyopathy with EF 45%, hypertension, OSA, depression/anxiety presenting for direct admission after abdominal supra cervical hysterectomy with bilateral supra salpingectomy performed by Dr. Elonda Husky.  The patient had preoperative clearance from cardiology prior to the operation and preoperative labs on the day of admission were essentially unremarkable except for WBC 14.9.  She also had potassium 3.4 and serum creatinine 0.90.  Hemoglobin preoperatively was 11.9 . Due to the patient's difficult surgery and EBL 1500 cc, patient was directly admitted to the ICU after surgery for postoperative care and close monitoring. At the time of my evaluation, the patient is awake and following simple commands.  She denies any fevers, chills, chest pain, shortness breath, nausea, vomiting, diarrhea,  dysuria, hematuria.  She has some mild postoperative pain in her abdomen but it is controlled.  She feels hungry and wants to eat.  Postoperative labs showed sodium 138, potassium 3.9, chloride 104, CO2 27, BUN 16, creatinine 0.90.  WBC 14.9, hemoglobin 10.9, platelets 237.  Postoperatively, the patient was afebrile and hemodynamically stable with blood pressure 143/94 with oxygen saturation 97-100% on nonrebreather.  Assessment/Plan: Operative blood loss -Baseline hemoglobin 10-11 -Monitor serial hemoglobins as this may equilibrate -2 units PRBC transfused intraoperatively -Remainder of postoperative care per GYN  Chronic systolic and diastolic CHF -[id number] echo EF 45% with pseudonormalization -Restart home dose torsemide 40 mg daily -Restart reduced dose carvedilol secondary to blood pressure  and titrate as needed as the blood pressure improves -Restart hydralazine.  Hold if the patient becomes hypotensive -Restart Entresto  Uncontrolled diabetes mellitus type 2 with hyperglycemia -09/24/2019 hemoglobin A1c 11.9 -NovoLog sliding scale -Start reduced dose Lantus 15 units daily  Depression/anxiety -Continue home dose Klonopin  Hyperlipidemia -Continue statin  Hypokalemia -Replete -Check magnesium       Past Medical History:  Diagnosis Date  . Anemia    H&H of 10.6/33 and 07/2008 and 11.9/35 and 09/2010  . Anxiety   . Chronic combined systolic and diastolic CHF (congestive heart failure) ([place])   . Depression with anxiety   . Diabetes mellitus without complication ([place])   . Hypertension   . Hypertensive heart disease 2009   Pulmonary edema postpartum; mild to moderate mitral regurgitation when hospitalized for CHF in 2009; Echocardiogram in 12/2009-no MR and normal EF; normal CXR in 09/2010  . Migraine headache   . Miscarriage 03/19/2013  . Obesity 04/16/2009  . Osteoarthritis, knee 03/29/2011  . Preeclampsia   . Pulmonary edema   . Sleep apnea   . Threatened abortion in early pregnancy 03/15/2013   Past Surgical History:  Procedure Laterality Date  . BREAST REDUCTION SURGERY  2002  . CARDIAC CATHETERIZATION N/A 12/22/2015   Procedure: Left Heart Cath and Coronary Angiography;  Surgeon: Peter M Martinique, MD;  Location: [facility];  Service: Cardiovascular;  Laterality: N/A;  . CESAREAN SECTION N/A 04/09/2014   Procedure: CESAREAN SECTION;  Surgeon: Mora Bellman, MD;  Location: [facility];  Service: Obstetrics;  Laterality: N/A;  . CHOLECYSTECTOMY     Social History:  reports that she has never smoked. She has never used smokeless tobacco. She reports current alcohol use. She reports that she does not use drugs.  Family History  Problem Relation Age of Onset  . Diabetes Mother 60  . Heart disease Mother   . Hyperlipidemia Paternal Grandfather   .  Hypertension Paternal Grandfather   . Heart disease Father   . Hypertension Father   . Heart disease Maternal Grandmother 60  . ADD / ADHD Son   . Hypertension Maternal Uncle   . Heart attack Brother   . Sudden death Neg Hx   . Colon cancer Neg Hx   . Celiac disease Neg Hx   . Inflammatory bowel disease Neg Hx      Allergies  Allergen Reactions  . Diclofenac Swelling    AND POSSIBLE SYNCOPE; tolerates ibuprofen per pt  . Tramadol Nausea And Vomiting and Nausea Only    Itching (12/21); tolerates ibuprofen per pt  . Vicodin [Hydrocodone-Acetaminophen] Itching and Nausea Only  . Hydrocodone Bitartrate Er Itching     Prior to Admission medications   Medication Sig Start Date End Date Taking? Authorizing Provider  albuterol (PROVENTIL HFA;VENTOLIN HFA) 108 (90 Base) MCG/ACT inhaler Inhale 1 puff into the lungs every 6 (six) hours as needed for wheezing or shortness of breath.   Yes [provider]  amLODipine (NORVASC) 10 MG tablet Take 1 tablet (10 mg total) by mouth daily. 09/04/19 12/03/19 Yes Strader, Fransisco Hertz, PA-C  ARIPiprazole (ABILIFY) 10 MG tablet Take 1 tablet (10 mg total) by mouth daily. 10/30/18  Yes Barton Dubois, MD  aspirin EC 81 MG tablet Take 81 mg by mouth daily.   Yes [provider]  atorvastatin (LIPITOR) 20 MG tablet Take 1 tablet (20 mg total) by mouth daily at 6 PM. 06/03/18  Yes Janes Colegrove, Shanon Brow, MD  budesonide-formoterol (SYMBICORT) 80-4.5 MCG/ACT inhaler Inhale 2 puffs into the lungs 2 (two) times daily. 05/04/18  Yes Shah, Pratik D, DO  carvedilol (COREG) 25 MG tablet TAKE 2 TABLETS BY MOUTH TWICE DAILY WITH A MEAL. Patient taking differently: Take 50 mg by mouth 2 (two) times daily with a meal.  08/09/19  Yes Branch, Alphonse Guild, MD  clonazePAM (KLONOPIN) 0.5 MG tablet Take 0.5 mg by mouth 2 (two) times daily. 12/19/18  Yes [provider]  gabapentin (NEURONTIN) 600 MG tablet Take 600 mg by mouth 3 (three) times daily.    Yes [provider]  hydrALAZINE (APRESOLINE) 100 MG tablet Take 1 tablet (100 mg total) by mouth 3 (three) times daily. 06/03/18  Yes Evella Kasal, Shanon Brow, MD  insulin glargine (LANTUS) 100 UNIT/ML injection Inject 30 Units at bedtime into the skin.    Yes [provider]  insulin lispro (HUMALOG) 100 UNIT/ML injection Inject 15 Units into the skin 3 (three) times daily before meals.    Yes [provider]  Ipratropium-Albuterol (COMBIVENT RESPIMAT) 20-100 MCG/ACT AERS respimat Inhale 1 puff into the lungs every 6 (six) hours as needed for wheezing or shortness of breath. 05/04/18  Yes Manuella Ghazi, Pratik D, DO  linaclotide [facility]) 145 MCG CAPS capsule Take 1 capsule (145 mcg total) by mouth daily before breakfast. 03/14/19  Yes Erenest Rasher, PA-C  Magnesium 400 MG CAPS Take 400 mg by mouth 2 (two) times daily. 06/05/19  Yes Dunn, Dayna N, PA-C  ondansetron (ZOFRAN ODT) 8 MG disintegrating tablet Take 1 tablet (8 mg total) by mouth every 8 (eight) hours as needed for nausea or vomiting. 09/06/19  Yes Long, Wonda Olds, MD  oxyCODONE-acetaminophen (PERCOCET/ROXICET) 5-325 MG tablet Take 1 tablet by mouth every 4 (four) hours as needed. 09/24/19  Yes  Jonnie Kind, MD  pantoprazole (PROTONIX) 40 MG tablet Take 1 tablet (40 mg total) by mouth 2 (two) times daily before a meal. 03/14/19  Yes Jodi Mourning, Kristen S, PA-C  potassium chloride SA (KLOR-CON M20) 20 MEQ tablet Take 2 tablets (40 mEq total) by mouth daily. 09/04/19 12/03/19 Yes Strader, Fransisco Hertz, PA-C  promethazine (PHENERGAN) 25 MG tablet Take 1 tablet (25 mg total) by mouth every 6 (six) hours as needed for nausea or vomiting. 02/06/19  Yes Idol, Almyra Free, PA-C  pyridostigmine (MESTINON) 60 MG tablet Take 60 mg by mouth every 8 (eight) hours.   Yes [provider]  sacubitril-valsartan (ENTRESTO) 97-103 MG Take 1 tablet by mouth 2 (two) times daily. 03/16/19  Yes BranchAlphonse Guild, MD  torsemide (DEMADEX) 20 MG tablet Take 2 tablets (40 mg  total) by mouth daily. 09/04/19 12/03/19 Yes Strader, Fransisco Hertz, PA-C  traZODone (DESYREL) 100 MG tablet Take 2 tablets (200 mg total) by mouth at bedtime. 10/29/18  Yes Barton Dubois, MD  ketorolac (TORADOL) 10 MG tablet TAKE (1) TABLET BY MOUTH EVERY EIGHT HOURS AS NEEDED. Patient not taking: Reported on 09/21/2019 09/06/19   Long, Wonda Olds, MD  methocarbamol (ROBAXIN) 500 MG tablet Take 1 tablet (500 mg total) by mouth every 8 (eight) hours as needed for muscle spasms. Patient not taking: Reported on 09/21/2019 01/02/19   Davonna Belling, MD  nitroGLYCERIN (NITROSTAT) 0.4 MG SL tablet Place 1 tablet (0.4 mg total) under the tongue every 5 (five) minutes as needed for chest pain. 09/05/15   Kathie Dike, MD    Review of Systems:  Constitutional:  No weight loss, night sweats, Fevers, chills, fatigue.  Head&Eyes: No headache.  No vision loss.  No eye pain or scotoma ENT:  No Difficulty swallowing,Tooth/dental problems,Sore throat,  No ear ache, post nasal drip,  Cardio-vascular:  No chest pain, Orthopnea, PND, swelling in lower extremities,  dizziness, palpitations  GI:  No  abdominal pain, nausea, vomiting, diarrhea, loss of appetite, hematochezia, melena, heartburn, indigestion, Resp:  No shortness of breath with exertion or at rest. No cough. No coughing up of blood .No wheezing.No chest wall deformity  Skin:  no rash or lesions.  GU:  no dysuria, change in color of urine, no urgency or frequency. No flank pain.  Musculoskeletal:  No joint pain or swelling. No decreased range of motion. No back pain.  Psych:  No change in mood or affect. No depression or anxiety. Neurologic: No headache, no dysesthesia, no focal weakness, no vision loss. No syncope  Physical Exam: Vitals:   09/26/19 1115 09/26/19 1130 09/26/19 1145 09/26/19 1200  BP:  (!) 117/93 (!) 143/94   Pulse: 74 71 68 71  Resp: 15 (!) 9 (!) 9 11  Temp:      TempSrc:      SpO2: 100% 100% 100% 97%   General:  A&O x 3,  NAD, nontoxic, pleasant/cooperative Head/Eye: No conjunctival hemorrhage, no icterus, Chelsey Santiago, No nystagmus ENT:  No icterus,  No thrush, good dentition, no pharyngeal exudate Neck:  No masses, no lymphadenpathy, no bruits CV:  RRR, no rub, no gallop, no S3 Lung:  CTAB, good air movement, no wheeze, no rhonchi Abdomen: soft/NT, +BS, nondistended, no peritoneal signs Ext: No cyanosis, No rashes, No petechiae, No lymphangitis, Non pitting edema Neuro: CNII-XII intact, strength 4/5 in bilateral upper and lower extremities, no dysmetria  Labs on Admission:  Basic Metabolic Panel: Recent Labs  Lab 09/24/19 0943 09/24/19 0943 09/26/19 0724 09/26/19 1138  NA 133*  --  140 138  K 3.0*   < > 3.4* 3.9  CL 94*  --  101 104  CO2 28  --   --  27  GLUCOSE 236*  --  88 93  BUN 15  --  16 16  CREATININE 1.16*  --  0.90 0.90  CALCIUM 8.9  --   --  7.9*   < > = values in this interval not displayed.   Liver Function Tests: Recent Labs  Lab 09/24/19 0943  AST 21  ALT 21  ALKPHOS 78  BILITOT 0.4  PROT 8.0  ALBUMIN 3.6   No results for input(s): LIPASE, AMYLASE in the last 168 hours. No results for input(s): AMMONIA in the last 168 hours. CBC: Recent Labs  Lab 09/24/19 0943 09/26/19 0724 09/26/19 1138  WBC 10.7*  --  14.9*  NEUTROABS  --   --  11.6*  HGB 11.8* 11.9* 10.9*  HCT 38.9 35.0* 35.5*  MCV 76.3*  --  82.6  PLT 331  --  237   Coagulation Profile: No results for input(s): INR, PROTIME in the last 168 hours. Cardiac Enzymes: No results for input(s): CKTOTAL, CKMB, CKMBINDEX, TROPONINI in the last 168 hours. BNP: Invalid input(s): POCBNP CBG: Recent Labs  Lab 09/26/19 0645  GLUCAP 148*   Urine analysis:    Component Value Date/Time   COLORURINE YELLOW 08/28/2019 1226   APPEARANCEUR CLEAR 08/28/2019 1226   LABSPEC 1.028 08/28/2019 1226   PHURINE 6.0 08/28/2019 1226   GLUCOSEU >=500 (A) 08/28/2019 1226   HGBUR NEGATIVE 08/28/2019 1226   BILIRUBINUR NEGATIVE  08/28/2019 1226   KETONESUR NEGATIVE 08/28/2019 1226   PROTEINUR NEGATIVE 08/28/2019 1226   UROBILINOGEN 0.2 04/29/2014 1115   NITRITE NEGATIVE 08/28/2019 1226   LEUKOCYTESUR TRACE (A) 08/28/2019 1226   Sepsis Labs: @LABRCNTIP (procalcitonin:4,lacticidven:4) ) Recent Results (from the past 240 hour(s))  SARS CORONAVIRUS 2 (Chelsey Santiago 6-24 HRS) Nasopharyngeal Nasopharyngeal Swab     Status: None   Collection Time: 09/24/19  9:10 AM   Specimen: Nasopharyngeal Swab  Result Value Ref Range Status   SARS Coronavirus 2 NEGATIVE NEGATIVE Final    Comment: (NOTE) SARS-CoV-2 target nucleic acids are NOT DETECTED. The SARS-CoV-2 RNA is generally detectable in upper and lower respiratory specimens during the acute phase of infection. Negative results do not preclude SARS-CoV-2 infection, do not rule out co-infections with other pathogens, and should not be used as the sole basis for treatment or other patient management decisions. Negative results must be combined with clinical observations, patient history, and epidemiological information. The expected result is Negative. Fact Sheet for Patients: SugarRoll.be Fact Sheet for Healthcare Providers: https://www.woods-mathews.com/ This test is not yet approved or cleared by the [place] FDA and  has been authorized for detection and/or diagnosis of SARS-CoV-2 by FDA under an Emergency Use Authorization (EUA). This EUA will remain  in effect (meaning this test can be used) for the duration of the COVID-19 declaration under Section 56 4(b)(1) of the Act, 21 U.S.C. section 360bbb-3(b)(1), unless the authorization is terminated or revoked sooner. Performed at [facility], [place] [address] [place], [place] [address]      Radiological Exams on Admission: No results found.  EKG: Independently reviewed. Sinus, nonspecific STT changes    Time spent:60 minutes Code Status:   FULL Family  Communication:  Spouse updated at bedside 5/12 Disposition Plan: expect 1-2 day hospitalization Consults called: GYN DVT Prophylaxis:  SCDs  Shanon Brow Lori-Ann Lindfors, DO  [facility] Pager [phone]  If 7PM-7AM, please contact night-coverage [email or web address] Password [facility] 09/26/2019, 12:10 PM

## 2019-09-26 NOTE — H&P (Signed)
Preoperative History and Physical  Chelsey Santiago is a 44 y.o. [id number] with No LMP recorded. admitted for a abdominal supracervical hysterectomy with bilateral salpingectomy.  Pt seen in follow up from ED with MRI revelaing a degenerating myoma 9 cm Will need surgical removal supracervical hysterectomy Will need pre op eval by cardiology first, discussed with Dr Charna Elizabeth Was cleared by cardiology after pre op visit  PMH:    Past Medical History:  Diagnosis Date  . Anemia    H&H of 10.6/33 and 07/2008 and 11.9/35 and 09/2010  . Anxiety   . Chronic combined systolic and diastolic CHF (congestive heart failure) ([place])   . Depression with anxiety   . Diabetes mellitus without complication ([place])   . Hypertension   . Hypertensive heart disease 2009   Pulmonary edema postpartum; mild to moderate mitral regurgitation when hospitalized for CHF in 2009; Echocardiogram in 12/2009-no MR and normal EF; normal CXR in 09/2010  . Migraine headache   . Miscarriage 03/19/2013  . Obesity 04/16/2009  . Osteoarthritis, knee 03/29/2011  . Preeclampsia   . Pulmonary edema   . Sleep apnea   . Threatened abortion in early pregnancy 03/15/2013    PSH:     Past Surgical History:  Procedure Laterality Date  . BREAST REDUCTION SURGERY  2002  . CARDIAC CATHETERIZATION N/A 12/22/2015   Procedure: Left Heart Cath and Coronary Angiography;  Surgeon: Peter M Martinique, MD;  Location: [facility];  Service: Cardiovascular;  Laterality: N/A;  . CESAREAN SECTION N/A 04/09/2014   Procedure: CESAREAN SECTION;  Surgeon: Mora Bellman, MD;  Location: [facility];  Service: Obstetrics;  Laterality: N/A;  . CHOLECYSTECTOMY      POb/GynH:      OB History    Gravida  11   Para  6   Term  5   Preterm  1   AB  5   Living  6     SAB  3   TAB  2   Ectopic      Multiple  0   Live Births  6           SH:   Social History   Tobacco Use  . Smoking status: Never Smoker  . Smokeless tobacco: Never  Used  Substance Use Topics  . Alcohol use: Yes    Comment: occ  . Drug use: No    FH:    Family History  Problem Relation Age of Onset  . Diabetes Mother 42  . Heart disease Mother   . Hyperlipidemia Paternal Grandfather   . Hypertension Paternal Grandfather   . Heart disease Father   . Hypertension Father   . Heart disease Maternal Grandmother 60  . ADD / ADHD Son   . Hypertension Maternal Uncle   . Heart attack Brother   . Sudden death Neg Hx   . Colon cancer Neg Hx   . Celiac disease Neg Hx   . Inflammatory bowel disease Neg Hx      Allergies:  Allergies  Allergen Reactions  . Diclofenac Swelling    AND POSSIBLE SYNCOPE; tolerates ibuprofen per pt  . Tramadol Nausea And Vomiting and Nausea Only    Itching (12/21); tolerates ibuprofen per pt  . Vicodin [Hydrocodone-Acetaminophen] Itching and Nausea Only  . Hydrocodone Bitartrate Er Itching    Medications:       Current Facility-Administered Medications:  .  bupivacaine liposome (EXPAREL) 1.3 % injection 266 mg, 20 mL, Infiltration, Once, Florian Buff,  MD .  ceFAZolin (ANCEF) IVPB 2g/100 mL premix, 2 g, Intravenous, On Call to OR, Florian Buff, MD .  ketorolac (TORADOL) 30 MG/ML injection 30 mg, 30 mg, Intravenous, Once, Florian Buff, MD .  midazolam (VERSED) injection 2 mg, 2 mg, Intravenous, Once, Battula, Rajamani C, MD .  scopolamine (TRANSDERM-SCOP) 1 MG/3DAYS 1.5 mg, 1 patch, Transdermal, Once, Battula, Rajamani C, MD  Review of Systems:   Review of Systems  Constitutional: Negative for fever, chills, weight loss, malaise/fatigue and diaphoresis.  HENT: Negative for hearing loss, ear pain, nosebleeds, congestion, sore throat, neck pain, tinnitus and ear discharge.   Eyes: Negative for blurred vision, double vision, photophobia, pain, discharge and redness.  Respiratory: Negative for cough, hemoptysis, sputum production, shortness of breath, wheezing and stridor.   Cardiovascular: Negative for chest  pain, palpitations, orthopnea, claudication, leg swelling and PND.  Gastrointestinal: Positive for abdominal pain. Negative for heartburn, nausea, vomiting, diarrhea, constipation, blood in stool and melena.  Genitourinary: Negative for dysuria, urgency, frequency, hematuria and flank pain.  Musculoskeletal: Negative for myalgias, back pain, joint pain and falls.  Skin: Negative for itching and rash.  Neurological: Negative for dizziness, tingling, tremors, sensory change, speech change, focal weakness, seizures, loss of consciousness, weakness and headaches.  Endo/Heme/Allergies: Negative for environmental allergies and polydipsia. Does not bruise/bleed easily.  Psychiatric/Behavioral: Negative for depression, suicidal ideas, hallucinations, memory loss and substance abuse. The patient is not nervous/anxious and does not have insomnia.      PHYSICAL EXAM:  Blood pressure 129/84, temperature (!) 97.5 F (36.4 C), temperature source Oral, resp. rate 18, SpO2 97 %.    Vitals reviewed. Constitutional: She is oriented to person, place, and time. She appears well-developed and well-nourished.  HENT:  Head: Normocephalic and atraumatic.  Right Ear: External ear normal.  Left Ear: External ear normal.  Nose: Nose normal.  Mouth/Throat: Oropharynx is clear and moist.  Eyes: Conjunctivae and EOM are normal. Pupils are equal, round, and reactive to light. Right eye exhibits no discharge. Left eye exhibits no discharge. No scleral icterus.  Neck: Normal range of motion. Neck supple. No tracheal deviation present. No thyromegaly present.  Cardiovascular: Normal rate, regular rhythm, normal heart sounds and intact distal pulses.  Exam reveals no gallop and no friction rub.   No murmur heard. Respiratory: Effort normal and breath sounds normal. No respiratory distress. She has no wheezes. She has no rales. She exhibits no tenderness.  GI: Soft. Bowel sounds are normal. She exhibits no distension and  no mass. There is tenderness. There is no rebound and no guarding.  Genitourinary:       Vulva is normal without lesions Vagina is pink moist without discharge Cervix normal in appearance and pap is normal Uterus is 18 weeks size with 9 cm dgenerating myoma Adnexa is negative with normal sized ovaries by sonogram  Musculoskeletal: Normal range of motion. She exhibits no edema and no tenderness.  Neurological: She is alert and oriented to person, place, and time. She has normal reflexes. She displays normal reflexes. No cranial nerve deficit. She exhibits normal muscle tone. Coordination normal.  Skin: Skin is warm and dry. No rash noted. No erythema. No pallor.  Psychiatric: She has a normal mood and affect. Her behavior is normal. Judgment and thought content normal.    Labs: Results for orders placed or performed during the hospital encounter of 09/26/19 (from the past 336 hour(s))  Glucose, capillary   Collection Time: 09/26/19  6:45 AM  Result Value  Ref Range   Glucose-Capillary 148 (H) 70 - 99 mg/dL  Results for orders placed or performed during the hospital encounter of 09/24/19 (from the past 336 hour(s))  Hemoglobin A1c   Collection Time: 09/24/19  9:43 AM  Result Value Ref Range   Hgb A1c MFr Bld 11.9 (H) 4.8 - 5.6 %   Mean Plasma Glucose 294.83 mg/dL  CBC   Collection Time: 09/24/19  9:43 AM  Result Value Ref Range   WBC 10.7 (H) 4.0 - 10.5 K/uL   RBC 5.10 3.87 - 5.11 MIL/uL   Hemoglobin 11.8 (L) 12.0 - 15.0 g/dL   HCT 38.9 36.0 - 46.0 %   MCV 76.3 (L) 80.0 - 100.0 fL   MCH 23.1 (L) 26.0 - 34.0 pg   MCHC 30.3 30.0 - 36.0 g/dL   RDW 14.5 11.5 - 15.5 %   Platelets 331 150 - 400 K/uL   nRBC 0.0 0.0 - 0.2 %  Comprehensive metabolic panel   Collection Time: 09/24/19  9:43 AM  Result Value Ref Range   Sodium 133 (L) 135 - 145 mmol/L   Potassium 3.0 (L) 3.5 - 5.1 mmol/L   Chloride 94 (L) 98 - 111 mmol/L   CO2 28 22 - 32 mmol/L   Glucose, Bld 236 (H) 70 - 99 mg/dL    BUN 15 6 - 20 mg/dL   Creatinine, Ser 1.16 (H) 0.44 - 1.00 mg/dL   Calcium 8.9 8.9 - 10.3 mg/dL   Total Protein 8.0 6.5 - 8.1 g/dL   Albumin 3.6 3.5 - 5.0 g/dL   AST 21 15 - 41 U/L   ALT 21 0 - 44 U/L   Alkaline Phosphatase 78 38 - 126 U/L   Total Bilirubin 0.4 0.3 - 1.2 mg/dL   GFR calc non Af Amer 57 (L) >60 mL/min   GFR calc Af Amer >60 >60 mL/min   Anion gap 11 5 - 15  hCG, quantitative, pregnancy   Collection Time: 09/24/19  9:43 AM  Result Value Ref Range   hCG, Beta Chain, Quant, S <1 <5 mIU/mL  Rapid HIV screen (HIV 1/2 Ab+Ag)   Collection Time: 09/24/19  9:43 AM  Result Value Ref Range   HIV-1 P24 Antigen - HIV24 NON REACTIVE NON REACTIVE   HIV 1/2 Antibodies NON REACTIVE NON REACTIVE   Interpretation (HIV Ag Ab)      A non reactive test result means that HIV 1 or HIV 2 antibodies and HIV 1 p24 antigen were not detected in the specimen.  Type and screen   Collection Time: 09/24/19  9:44 AM  Result Value Ref Range   ABO/RH(D) O NEG    Antibody Screen NEG    Sample Expiration      10/08/2019,2359 Performed at [facility], [address] [place], [place] [address]   Results for orders placed or performed during the hospital encounter of 09/24/19 (from the past 336 hour(s))  SARS CORONAVIRUS 2 (TAT 6-24 HRS) Nasopharyngeal Nasopharyngeal Swab   Collection Time: 09/24/19  9:10 AM   Specimen: Nasopharyngeal Swab  Result Value Ref Range   SARS Coronavirus 2 NEGATIVE NEGATIVE    EKG: Orders placed or performed during the hospital encounter of 09/26/19  . EKG 12-Lead  . EKG 12-Lead    Imaging Studies: MR PELVIS W WO CONTRAST  Result Date: 08/28/2019 CLINICAL DATA:  Evaluate pelvic mass. EXAM: MRI PELVIS WITHOUT AND WITH CONTRAST TECHNIQUE: Multiplanar multisequence MR imaging of the pelvis was performed both before and after  administration of intravenous contrast. CONTRAST:  3mL GADAVIST GADOBUTROL 1 MMOL/ML IV SOLN COMPARISON:  CT dated 07/04/2019 and ultrasound  pelvis 02/16/2019 FINDINGS: Urinary Tract: No focal bladder abnormality. Enlarged uterus has mass effect upon the bladder which is deviated into the right hemipelvis. Bowel:  Unremarkable visualized pelvic bowel loops. Vascular/Lymphatic: No pathologically enlarged lymph nodes. No significant vascular abnormality seen. Reproductive: Uterus: Measures 16.2 x 8.0 x 7.9 cm (volume = 540 cm^3). The uterus has a diffusely heterogeneous appearance with multiple small enhancing lesions which are felt to represent fibroids. -Intramural fibroid within the uterine fundus measures 1.6 cm, image, image 13/8. -Anterior fundal fibroid is identified measuring 2.3 cm, image 16/8. -Calcified sub mucosal fibroid within the left side of fundus measures 0.9 cm, image 14/8. -Large, well-circumscribed mass within the left side pelvis is identified and felt to represent a large exophytic, subserosal fibroid. This measures 8.9 x 6.5 by 7.3 cm (volume = 220 cm^3), image 20/8. There is diffuse internal enhancement within this structure. On 02/06/2019 this mass measured 9.5 x 6.4 by 7.6 cm (volume = 240 cm^3) Endometrium: The endometrium measures 6 mm in thickness, image 16/3. There is a left sided 0.9 x 1.0 cm calcified sub mucosal fibroid. Right ovary measures 3.1 x 2.4 by 3.2 cm. No adnexal mass. Left ovary measures 2.9 x 2.5 by 3.9 cm. No adnexal mass. identified. Other:  None. Musculoskeletal: No suspicious bone lesions identified. IMPRESSION: 1. Enlarged uterus with multiple fibroids. 2. Large, well-circumscribed mass within the left side of the pelvis is identified measuring 8.9 x 6.5 x 7.3 cm. There is diffuse internal enhancement within this structure. Favored to represent a large subserosal, exophytic fibroid. This is not significantly changed in size when compared with 02/06/2019. 3. The endometrium measures 6 mm in thickness with a small left sided calcified sub mucosal fibroid. Electronically Signed   By: Kerby Moors M.D.   On:  08/28/2019 12:55      Assessment: Degenerating uterine fibroids Dysmenorrhea Menorrhagia Anemia due to menorrhagia  Plan: Abdominal supracervical hysterectomy with bilateral salpingectomy  Florian Buff 09/26/2019 7:17 AM

## 2019-09-26 NOTE — Op Note (Signed)
Preoperative diagnosis:  1.  9 cm uterine fibroid, degenerating                                         2.  Severe pelvic pain due to degenerating myoma                                         3.  Menorrhagia                                         4.  Dysmenorrhea                                         5.  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 It appeared to be a pedunculated myoma on a stalk  Intraoperatively however it turned out to be a very broad-based left broad ligament myoma with degeneration and significant recruitment of blood supply in the broad ligament space  The uterus otherwise appeared to be normal as were the ovaries and tubes  Unfortunately for me the cervix was also quite long    Description of operation:  Patient was taken to the operating room and placed in the supine position where she underwent general endotracheal anesthesia.  She was then prepped and draped in the usual sterile fashion and a Foley catheter was placed for continuous bladder drainage.  A Pfannenstiel skin incision was made and carried down sharply to the rectus fascia which was scored in the midline and extended laterally.  The fascia was taken off the muscles superiorly and inferiorly without difficulty.  The  muscles were divided.  The peritoneal cavity was entered.  A large Alexis retractor was placed.  the uterus was delivered thru the abdominal incision.     Both uterine cornu were grasped with Coker clamps.  An avascular window was made in the left utero ovarian ligament broad ligament.  The pedical was double suture ligated and cut.  The ovarian side of the pedicle was double suture ligated. The same sequence of steps/procedure was performed on the right sideas well.    Serial pedicles were taken medial to the utero ovarian pedicle.  Each pedicle was clamped cut and tranfixion suture ligated.  The uterine vessels were skeletonized bilaterally.  The uterine vessels were clamped bilaterally,  then cut and suture ligated. This is where the surgery became quite difficult. I went down as far as I could at this point in the left side to try to interrupt as much blood supply to the myoma as I could However at this point in order to avoid injury to the left ureter I incised the peritoneum laying over the fibroid and then perform manual dissection laterally and was able to deliver ultimately this large degenerative hypervascular fibroid through the peritoneal reflection. It was a 9 cm myoma and probably had a 9 cm attachment to the lower uterine segment cervix. The entire myoma was below the level of the uterine vessels on the left I took serial pedicles across the broad base of the fibroid each pedicle was clamped cut and transfixion suture ligated It took about 5 pedicles to get all the way across the fibroid There was good hemostasis of these pedicles however because I had to do blunt dissection laterally in the broad ligament again to avoid injury the left ureter there was a significant amount of venous bleeding on this side during the dissection and removal of the fibroid I then took serial pedicles down the cervix on each side The preoperative plan was to conserve the cervix however because of the location  of the degenerating myoma on the left and the blood supply that had been recruited and generated I was not able to salvage and conserve the cervix in order to get the blood supply of this large essentially complete length of cervix mass Unfortunately for me was also a very long cervix I have probably took 6 pedicles down each side each pedicle was clamped cut and transfixion suture ligated I then crossclamped the vagina and the uterus and cervix were removed The degenerating myoma had already been completely excised and was sent separately The vagina was closed with vaginal angle sutures I then closed the vagina with interrupted figure-of-eight sutures There was a split along the posterior vagina and it was closed with interrupted figure-of-eight sutures as well staying medial to the uterosacral ligaments to avoid injury to either ureter There were some small venous vessels that were present in the broad ligament tissue that had been recruited by the myoma Hemostasis was achieved using large hemoclips again in order to avoid electrocautery or suturing which could have potentially injured the underside of the bladder and/or the ureter on that side  I then turned my attention to the fallopian tubes Each was grasped with a Babcock and crossclamped with a Kelly Each tube was removed and a 4 and aft suture was placed bilaterally with good resulting hemostasis  All pedicles were once again evaluated and all were found to be hemostatic the pelvis was irrigated vigorously Again all pedicles found to be hemostatic 1 3 cc vial of Arista was placed in the pelvis to augment the cut surface of the peritoneum and broad ligament space hemostasis  Estimated blood loss for the procedure was 1500 cc   All specimens were sent to pathology for routine evaluation and all were sent separately. all counts were correct at this point x 3.   The muscles and peritoneum were reapproximated loosely.  The fascia was closed  with 0 Vicryl running.  266mg  of exparel 20 cc total volume was injected into the subcutaneous tissue for post operative pain management.   The skin was closed using 3-0 Vicryl on a Lanny Hurst  needle in a subcuticular fashion.  Dermabond was then applied for additional wound integrity and to serve as a postoperative bacterial barrier.   Per the preoperative plan the patient had a bothersome right vulvar lesion that I agreed to remove You were think it was a lipoma but it really was not filled out like a lipoma I am not really sure what the pathology is but it appears to be benign The stalk was crossclamped the single Kelly suture and a 4 and aft suture was placed for hemostasis There was good hemostasis   the patient was awakened from anesthesia taken to the recovery room in good stable condition. All sponge instrument and needle counts were correct x 3.  The patient received 2 g Ancef and 30 mg of Toradol prophylactically preoperatively.    Estimated blood loss for the procedure was 1500  Cc.  The patient received 2 units of packed red blood cells intraoperatively and a hemoglobin will be obtained in the recovery room for evaluation of further need for transfusion  She will initially be admitted to the ICU for postoperative recovery due to her multiple medical problems and the difficulty of the surgery with significant blood loss The patient was stable at the time of being taken to the [place], MD 09/26/2019 10:48 AM

## 2019-09-26 NOTE — Anesthesia Preprocedure Evaluation (Signed)
Anesthesia Evaluation  Patient identified by MRN, date of birth, ID band Patient awake    Reviewed: Allergy & Precautions, NPO status , Patient's Chart, lab work & pertinent test results, reviewed documented beta blocker date and time   Airway Mallampati: III  TM Distance: >3 FB Neck ROM: Full    Dental  (+) Missing, Dental Advisory Given   Pulmonary shortness of breath and with exertion, sleep apnea , pneumonia,    Pulmonary exam normal breath sounds clear to auscultation       Cardiovascular hypertension, Pt. on medications and Pt. on home beta blockers + Past MI and +CHF  Normal cardiovascular exam Rhythm:Regular Rate:Normal + Systolic murmurs  The left ventricular systolic function is normal.  LV end diastolic pressure is mildly elevated.  The left ventricular ejection fraction is 50-55% by visual estimate.   1. No significant CAD 2. Good LV function 3. Mildly elevated LV EDP.  Echo-  1. The left ventricle has a visually estimated ejection fraction of 45%.  The cavity size was normal. There is severe concentric left ventricular  hypertrophy. Left ventricular diastolic Doppler parameters are consistent  with pseudonormalization. Elevated  left ventricular end-diastolic pressure Left ventricular diffuse  hypokinesis.  2. The right ventricle has normal systolic function. The cavity was  normal. There is no increase in right ventricular wall thickness.  3. Left atrial size was severely dilated.  4. The mitral valve is grossly normal.  5. The tricuspid valve is grossly normal.  6. The aortic valve is tricuspid. Mild thickening of the aortic valve.  7. Moderate aortic annular calcification.  8. The aortic root is normal in size and structure   Neuro/Psych  Headaches, PSYCHIATRIC DISORDERS Anxiety Depression    GI/Hepatic GERD  Medicated,  Endo/Other  diabetes, Poorly Controlled, Type 2, Insulin  DependentMorbid obesity  Renal/GU Renal InsufficiencyRenal disease     Musculoskeletal  (+) Arthritis ,   Abdominal   Peds  Hematology  (+) anemia ,   Anesthesia Other Findings 26-Sep-2019 06:56:21 [place] System-AP-300 ROUTINE RECORD Normal sinus rhythm Minimal voltage criteria for LVH, may be normal variant ( Cornell product ) ST & T wave abnormality, consider inferolateral ischemia Prolonged QT Abnormal ECG  Reproductive/Obstetrics                            Anesthesia Physical Anesthesia Plan  ASA: IV  Anesthesia Plan: General   Post-op Pain Management:    Induction: Intravenous  PONV Risk Score and Plan: 4 or greater and Ondansetron, Midazolam, Metaclopromide and Scopolamine patch - Pre-op  Airway Management Planned: Oral ETT and Video Laryngoscope Planned  Additional Equipment:   Intra-op Plan:   Post-operative Plan:   Informed Consent: I have reviewed the patients History and Physical, chart, labs and discussed the procedure including the risks, benefits and alternatives for the proposed anesthesia with the patient or authorized representative who has indicated his/her understanding and acceptance.     Dental advisory given  Plan Discussed with: CRNA and Surgeon  Anesthesia Plan Comments:         Anesthesia Quick Evaluation

## 2019-09-26 NOTE — Anesthesia Procedure Notes (Signed)
Procedure Name: Intubation Date/Time: 09/26/2019 7:54 AM Performed by: Jonna Munro, CRNA Pre-anesthesia Checklist: Patient identified, Emergency Drugs available, Suction available, Patient being monitored and Timeout performed Patient Re-evaluated:Patient Re-evaluated prior to induction Oxygen Delivery Method: Circle system utilized Preoxygenation: Pre-oxygenation with 100% oxygen Induction Type: IV induction Ventilation: Mask ventilation without difficulty Laryngoscope Size: Glidescope and 3 Grade View: Grade I Tube type: Oral Tube size: 7.0 mm Number of attempts: 1 Airway Equipment and Method: Stylet Placement Confirmation: ETT inserted through vocal cords under direct vision,  positive ETCO2 and breath sounds checked- equal and bilateral Secured at: 23 cm Tube secured with: Tape Dental Injury: Teeth and Oropharynx as per pre-operative assessment

## 2019-09-26 NOTE — Progress Notes (Signed)
Contacted pharmacy regarding patient's Unasyn dose for 1930 as patient received first dose at 1730. Pharmacist recommends skipping 1930 dose and following q6 dose for future doses.

## 2019-09-26 NOTE — Progress Notes (Signed)
Dr Elonda Husky and Dr Charna Elizabeth notified of Hgb 10.9 and Hct 35.5. No new orders given.

## 2019-09-27 ENCOUNTER — Encounter (HOSPITAL_COMMUNITY): Payer: Self-pay | Admitting: Internal Medicine

## 2019-09-27 DIAGNOSIS — E1165 Type 2 diabetes mellitus with hyperglycemia: Secondary | ICD-10-CM

## 2019-09-27 DIAGNOSIS — E876 Hypokalemia: Secondary | ICD-10-CM

## 2019-09-27 DIAGNOSIS — Z90710 Acquired absence of both cervix and uterus: Secondary | ICD-10-CM

## 2019-09-27 DIAGNOSIS — D219 Benign neoplasm of connective and other soft tissue, unspecified: Secondary | ICD-10-CM

## 2019-09-27 DIAGNOSIS — I5042 Chronic combined systolic (congestive) and diastolic (congestive) heart failure: Secondary | ICD-10-CM

## 2019-09-27 LAB — CBC
HCT: 32.7 % — ABNORMAL LOW (ref 36.0–46.0)
Hemoglobin: 10.3 g/dL — ABNORMAL LOW (ref 12.0–15.0)
MCH: 25.5 pg — ABNORMAL LOW (ref 26.0–34.0)
MCHC: 31.5 g/dL (ref 30.0–36.0)
MCV: 80.9 fL (ref 80.0–100.0)
Platelets: 209 10*3/uL (ref 150–400)
RBC: 4.04 MIL/uL (ref 3.87–5.11)
RDW: 15.3 % (ref 11.5–15.5)
WBC: 14.2 10*3/uL — ABNORMAL HIGH (ref 4.0–10.5)
nRBC: 0 % (ref 0.0–0.2)

## 2019-09-27 LAB — GLUCOSE, CAPILLARY
Glucose-Capillary: 111 mg/dL — ABNORMAL HIGH (ref 70–99)
Glucose-Capillary: 119 mg/dL — ABNORMAL HIGH (ref 70–99)
Glucose-Capillary: 181 mg/dL — ABNORMAL HIGH (ref 70–99)
Glucose-Capillary: 192 mg/dL — ABNORMAL HIGH (ref 70–99)

## 2019-09-27 LAB — BASIC METABOLIC PANEL
Anion gap: 8 (ref 5–15)
BUN: 12 mg/dL (ref 6–20)
CO2: 27 mmol/L (ref 22–32)
Calcium: 8 mg/dL — ABNORMAL LOW (ref 8.9–10.3)
Chloride: 101 mmol/L (ref 98–111)
Creatinine, Ser: 0.84 mg/dL (ref 0.44–1.00)
GFR calc Af Amer: >60 mL/min (ref >60)
GFR calc non Af Amer: >60 mL/min (ref >60)
Glucose, Bld: 143 mg/dL — ABNORMAL HIGH (ref 70–99)
Potassium: 3.7 mmol/L (ref 3.5–5.1)
Sodium: 136 mmol/L (ref 135–145)

## 2019-09-27 LAB — MAGNESIUM: Magnesium: 2.1 mg/dL (ref 1.7–2.4)

## 2019-09-27 LAB — SURGICAL PATHOLOGY

## 2019-09-27 LAB — MRSA PCR SCREENING: MRSA by PCR: POSITIVE — AB

## 2019-09-27 MED ORDER — ONDANSETRON 8 MG PO TBDP
8.0000 mg | ORAL_TABLET | Freq: Three times a day (TID) | ORAL | 0 refills | Status: DC | PRN
Start: 2019-09-27 — End: 2019-12-19

## 2019-09-27 MED ORDER — CARVEDILOL 12.5 MG PO TABS
25.0000 mg | ORAL_TABLET | Freq: Two times a day (BID) | ORAL | Status: DC
  Administered 2019-09-27: 25 mg via ORAL
  Filled 2019-09-27: qty 2

## 2019-09-27 MED ORDER — AMLODIPINE BESYLATE 5 MG PO TABS: 5.0000 mg | ORAL_TABLET | Freq: Every day | ORAL | Status: DC

## 2019-09-27 MED ORDER — HYDRALAZINE HCL 25 MG PO TABS
50.0000 mg | ORAL_TABLET | Freq: Three times a day (TID) | ORAL | Status: DC
  Administered 2019-09-27: 50 mg via ORAL
  Filled 2019-09-27: qty 2

## 2019-09-27 MED ORDER — IBUPROFEN 800 MG PO TABS: 800.0000 mg | ORAL_TABLET | Freq: Four times a day (QID) | ORAL | 0 refills | Status: DC

## 2019-09-27 MED ORDER — AMOXICILLIN-POT CLAVULANATE 875-125 MG PO TABS
1.0000 | ORAL_TABLET | Freq: Two times a day (BID) | ORAL | 0 refills | Status: DC
Start: 2019-09-27 — End: 2019-12-19

## 2019-09-27 MED ORDER — OXYCODONE HCL 5 MG PO TABS: 5.0000 mg | ORAL_TABLET | ORAL | 0 refills | Status: DC | PRN

## 2019-09-27 NOTE — Progress Notes (Signed)
Patient is to be discharged home and in stable condition. IV and telemetry removed, WNL. Patient given discharge instructions and verbalized understanding. All questions addressed and answered. Patient given abdominal binder and to be escorted out by staff via wheelchair to an awaiting vehicle.  Celestia Khat, RN

## 2019-09-27 NOTE — Discharge Instructions (Signed)
Abdominal Hysterectomy, Care After This sheet gives you information about how to care for yourself after your procedure. Your health care provider may also give you more specific instructions. If you have problems or questions, contact your health care provider. What can I expect after the procedure? After your procedure, it is common to have:  Pain.  Fatigue.  Poor appetite.  Less interest in sex.  Vaginal bleeding and discharge. You may need to use a sanitary napkin after this procedure. Follow these instructions at home: Bathing  Do not take baths, swim, or use a hot tub until your health care provider approves. Ask your health care provider if you can take showers. You may only be allowed to take sponge baths for bathing.  Keep the bandage (dressing) dry until your health care provider says it can be removed. Incision care   Follow instructions from your health care provider about how to take care of your incision. Make sure you: ? Wash your hands with soap and water before you change your bandage (dressing). If soap and water are not available, use hand sanitizer. ? Change your dressing as told by your health care provider. ? Leave stitches (sutures), skin glue, or adhesive strips in place. These skin closures may need to stay in place for 2 weeks or longer. If adhesive strip edges start to loosen and curl up, you may trim the loose edges. Do not remove adhesive strips completely unless your health care provider tells you to do that.  Check your incision area every day for signs of infection. Check for: ? Redness, swelling, or pain. ? Fluid or blood. ? Warmth. ? Pus or a bad smell. Activity  Do gentle, daily exercises as told by your health care provider. You may be told to take short walks every day and go farther each time.  Do not lift anything that is heavier than 10 lb (4.5 kg), or the limit that your health care provider tells you, until he or she says that it is  safe.  Do not drive or use heavy machinery while taking prescription pain medicine.  Do not drive for 24 hours if you were given a medicine to help you relax (sedative).  Follow your health care provider's instructions about exercise, driving, and general activities. Ask your health care provider what activities are safe for you. Lifestyle  Do not douche, use tampons, or have sex for at least 6 weeks or as told by your health care provider.  Do not drink alcohol until your health care provider approves.  Drink enough fluid to keep your urine clear or pale yellow.  Try to have someone at home with you for the first 1-2 weeks to help.  Do not use any products that contain nicotine or tobacco, such as cigarettes and e-cigarettes. These can delay healing. If you need help quitting, ask your health care provider. General instructions  Take over-the-counter and prescription medicines only as told by your health care provider.  Do not take aspirin or ibuprofen. These medicines can cause bleeding.  To prevent or treat constipation while you are taking prescription pain medicine, your health care provider may recommend that you: ? Drink enough fluid to keep your urine clear or pale yellow. ? Take over-the-counter or prescription medicines. ? Eat foods that are high in fiber, such as fresh fruits and vegetables, whole grains, and beans. ? Limit foods that are high in fat and processed sugars, such as fried and sweet foods.  Keep all   follow-up visits as told by your health care provider. This is important. Contact a health care provider if:  You have chills or fever.  You have redness, swelling, or pain around your incision.  You have fluid or blood coming from your incision.  Your incision feels warm to the touch.  You have pus or a bad smell coming from your incision.  Your incision breaks open.  You feel dizzy or light-headed.  You have pain or bleeding when you urinate.  You  have persistent diarrhea.  You have persistent nausea and vomiting.  You have abnormal vaginal discharge.  You have a rash.  You have any type of abnormal reaction or you develop an allergy to your medicine.  Your pain medicine does not help. Get help right away if:  You have a fever and your symptoms suddenly get worse.  You have severe abdominal pain.  You have shortness of breath.  You faint.  You have pain, swelling, or redness in your leg.  You have heavy vaginal bleeding with blood clots. Summary  After your procedure, it is common to have pain, fatigue and vaginal discharge.  Do not take baths, swim, or use a hot tub until your health care provider approves. Ask your health care provider if you can take showers. You may only be allowed to take sponge baths for bathing.  Follow your health care provider's instructions about exercise, driving, and general activities. Ask your health care provider what activities are safe for you.  Do not lift anything that is heavier than 10 lb (4.5 kg), or the limit that your health care provider tells you, until he or she says that it is safe.  Try to have someone at home with you for the first 1-2 weeks to help. This information is not intended to replace advice given to you by your health care provider. Make sure you discuss any questions you have with your health care provider. Document Revised: 06/06/2018 Document Reviewed: 04/21/2016 Elsevier Patient Education  2020 [employer] Inc[employer]  

## 2019-09-27 NOTE — Progress Notes (Signed)
1 Day Post-Op Procedure(s) (LRB): HYSTERECTOMY ABDOMINAL WITH SALPINGECTOMY (N/A) EXCISION LIPOMA RIGHT VULVAR (Right)  Subjective: Patient reports incisional pain and tolerating PO.    Objective: I have reviewed patient's vital signs, intake and output, medications and labs.  General: alert, cooperative and mild distress GI: soft, non-tender; bowel sounds normal; no masses,  no organomegaly incision clean dry through honeycomb  Assessment: s/p Procedure(s): HYSTERECTOMY ABDOMINAL WITH SALPINGECTOMY (N/A) EXCISION LIPOMA RIGHT VULVAR (Right): stable  Plan: Advance diet Encourage ambulation Discontinue IV fluids planned discharge later this afternoon  LOS: 1 day    Florian Buff 09/27/2019, 7:51 AM

## 2019-09-27 NOTE — Discharge Summary (Signed)
Physician Discharge Summary  Patient ID: Adalina Darcey MRN: [id number] DOB/AGE: 01-31-1976 44 y.o.  Admit date: 09/26/2019 Discharge date: 09/27/2019  Admission Diagnoses: S/p hysterectomy  Discharge Diagnoses:  Active Problems:   Hypokalemia   Morbid obesity (HCC)   Nonischemic cardiomyopathy (HCC)   Chronic combined systolic and diastolic CHF (congestive heart failure) ([place])   Uncontrolled type 2 diabetes mellitus with hyperglycemia (HCC)   Fibroids   S/P hysterectomy   Acute blood loss anemia   Discharged Condition: stable  Hospital Course: unremarkable post op course  Consults: hospitalist  Significant Diagnostic Studies: labs:   Results for orders placed or performed during the hospital encounter of 09/26/19 (from the past 24 hour(s))  Glucose, capillary     Status: Abnormal   Collection Time: 09/26/19  9:23 PM  Result Value Ref Range   Glucose-Capillary 134 (H) 70 - 99 mg/dL   Comment 1 Notify RN    Comment 2 Document in Chart   Hemoglobin and hematocrit, blood     Status: Abnormal   Collection Time: 09/26/19 11:10 PM  Result Value Ref Range   Hemoglobin 11.2 (L) 12.0 - 15.0 g/dL   HCT 35.8 (L) 36.0 - 46.0 %  CBC     Status: Abnormal   Collection Time: 09/27/19  4:29 AM  Result Value Ref Range   WBC 14.2 (H) 4.0 - 10.5 K/uL   RBC 4.04 3.87 - 5.11 MIL/uL   Hemoglobin 10.3 (L) 12.0 - 15.0 g/dL   HCT 32.7 (L) 36.0 - 46.0 %   MCV 80.9 80.0 - 100.0 fL   MCH 25.5 (L) 26.0 - 34.0 pg   MCHC 31.5 30.0 - 36.0 g/dL   RDW 15.3 11.5 - 15.5 %   Platelets 209 150 - 400 K/uL   nRBC 0.0 0.0 - 0.2 %  Basic metabolic panel     Status: Abnormal   Collection Time: 09/27/19  4:29 AM  Result Value Ref Range   Sodium 136 135 - 145 mmol/L   Potassium 3.7 3.5 - 5.1 mmol/L   Chloride 101 98 - 111 mmol/L   CO2 27 22 - 32 mmol/L   Glucose, Bld 143 (H) 70 - 99 mg/dL   BUN 12 6 - 20 mg/dL   Creatinine, Ser 0.84 0.44 - 1.00 mg/dL   Calcium 8.0 (L) 8.9 - 10.3 mg/dL   GFR calc  non Af Amer >60 >60 mL/min   GFR calc Af Amer >60 >60 mL/min   Anion gap 8 5 - 15  Magnesium     Status: None   Collection Time: 09/27/19  4:29 AM  Result Value Ref Range   Magnesium 2.1 1.7 - 2.4 mg/dL  Glucose, capillary     Status: Abnormal   Collection Time: 09/27/19  8:15 AM  Result Value Ref Range   Glucose-Capillary 119 (H) 70 - 99 mg/dL  Glucose, capillary     Status: Abnormal   Collection Time: 09/27/19 11:59 AM  Result Value Ref Range   Glucose-Capillary 181 (H) 70 - 99 mg/dL  Glucose, capillary     Status: Abnormal   Collection Time: 09/27/19  4:49 PM  Result Value Ref Range   Glucose-Capillary 192 (H) 70 - 99 mg/dL    Treatments: surgery: TAH BS  Discharge Exam: Blood pressure 122/86, pulse 80, temperature 98.2 F (36.8 C), temperature source Oral, resp. rate (!) 32, height 5\' 6"  (1.676 m), weight 109.1 kg, SpO2 91 %. General appearance: alert, cooperative and no distress GI: soft, non-tender; bowel  sounds normal; no masses,  no organomegaly Incision/Wound:clean dry intact  Disposition: Discharge disposition: 01-Home or Self Care       Discharge Instructions    Call MD for:  persistant nausea and vomiting   Complete by: As directed    Call MD for:  severe uncontrolled pain   Complete by: As directed    Call MD for:  temperature >100.4   Complete by: As directed    Diet - low sodium heart healthy   Complete by: As directed    Driving Restrictions   Complete by: As directed    No driving for 1 week   Increase activity slowly   Complete by: As directed    Leave dressing on - Keep it clean, dry, and intact until clinic visit   Complete by: As directed    Lifting restrictions   Complete by: As directed    Do not lift more than 10 pounds for 6 weeks   Sexual Activity Restrictions   Complete by: As directed    No sex for 8 weeks     Allergies as of 09/27/2019      Reactions   Diclofenac Swelling   AND POSSIBLE SYNCOPE; tolerates ibuprofen per pt    Tramadol Nausea And Vomiting, Nausea Only   Itching (12/21); tolerates ibuprofen per pt   Vicodin [hydrocodone-acetaminophen] Itching, Nausea Only   Hydrocodone Bitartrate Er Itching      Medication List    TAKE these medications   albuterol 108 (90 Base) MCG/ACT inhaler Commonly known as: VENTOLIN HFA Inhale 1 puff into the lungs every 6 (six) hours as needed for wheezing or shortness of breath.   amLODipine 10 MG tablet Commonly known as: NORVASC Take 1 tablet (10 mg total) by mouth daily.   amoxicillin-clavulanate 875-125 MG tablet Commonly known as: Augmentin Take 1 tablet by mouth 2 (two) times daily.   ARIPiprazole 10 MG tablet Commonly known as: ABILIFY Take 1 tablet (10 mg total) by mouth daily.   aspirin EC 81 MG tablet Take 81 mg by mouth daily.   atorvastatin 20 MG tablet Commonly known as: LIPITOR Take 1 tablet (20 mg total) by mouth daily at 6 PM.   budesonide-formoterol 80-4.5 MCG/ACT inhaler Commonly known as: Symbicort Inhale 2 puffs into the lungs 2 (two) times daily.   carvedilol 25 MG tablet Commonly known as: COREG TAKE 2 TABLETS BY MOUTH TWICE DAILY WITH A MEAL. What changed: See the new instructions.   clonazePAM 0.5 MG tablet Commonly known as: KLONOPIN Take 0.5 mg by mouth 2 (two) times daily.   Entresto 97-103 MG Generic drug: sacubitril-valsartan Take 1 tablet by mouth 2 (two) times daily.   gabapentin 600 MG tablet Commonly known as: NEURONTIN Take 600 mg by mouth 3 (three) times daily.   hydrALAZINE 100 MG tablet Commonly known as: APRESOLINE Take 1 tablet (100 mg total) by mouth 3 (three) times daily.   ibuprofen 800 MG tablet Commonly known as: ADVIL Take 1 tablet (800 mg total) by mouth every 6 (six) hours. Start taking on: Sep 28, 2019   insulin glargine 100 UNIT/ML injection Commonly known as: LANTUS Inject 30 Units at bedtime into the skin.   insulin lispro 100 UNIT/ML injection Commonly known as: HUMALOG Inject 15  Units into the skin 3 (three) times daily before meals.   Ipratropium-Albuterol 20-100 MCG/ACT Aers respimat Commonly known as: Combivent Respimat Inhale 1 puff into the lungs every 6 (six) hours as needed for wheezing or shortness of  breath.   ketorolac 10 MG tablet Commonly known as: TORADOL TAKE (1) TABLET BY MOUTH EVERY EIGHT HOURS AS NEEDED.   linaclotide 145 MCG Caps capsule Commonly known as: Linzess Take 1 capsule (145 mcg total) by mouth daily before breakfast.   Magnesium 400 MG Caps Take 400 mg by mouth 2 (two) times daily.   methocarbamol 500 MG tablet Commonly known as: ROBAXIN Take 1 tablet (500 mg total) by mouth every 8 (eight) hours as needed for muscle spasms.   nitroGLYCERIN 0.4 MG SL tablet Commonly known as: Nitrostat Place 1 tablet (0.4 mg total) under the tongue every 5 (five) minutes as needed for chest pain.   ondansetron 8 MG disintegrating tablet Commonly known as: Zofran ODT Take 1 tablet (8 mg total) by mouth every 8 (eight) hours as needed for nausea or vomiting. What changed: Another medication with the same name was added. Make sure you understand how and when to take each.   ondansetron 8 MG disintegrating tablet Commonly known as: Zofran ODT Take 1 tablet (8 mg total) by mouth every 8 (eight) hours as needed for nausea or vomiting. What changed: You were already taking a medication with the same name, and this prescription was added. Make sure you understand how and when to take each.   oxyCODONE 5 MG immediate release tablet Commonly known as: Oxy IR/ROXICODONE Take 1-2 tablets (5-10 mg total) by mouth every 4 (four) hours as needed for moderate pain.   oxyCODONE-acetaminophen 5-325 MG tablet Commonly known as: PERCOCET/ROXICET Take 1 tablet by mouth every 4 (four) hours as needed.   pantoprazole 40 MG tablet Commonly known as: PROTONIX Take 1 tablet (40 mg total) by mouth 2 (two) times daily before a meal.   potassium chloride SA 20  MEQ tablet Commonly known as: Klor-Con M20 Take 2 tablets (40 mEq total) by mouth daily.   promethazine 25 MG tablet Commonly known as: PHENERGAN Take 1 tablet (25 mg total) by mouth every 6 (six) hours as needed for nausea or vomiting.   pyridostigmine 60 MG tablet Commonly known as: MESTINON Take 60 mg by mouth every 8 (eight) hours.   torsemide 20 MG tablet Commonly known as: DEMADEX Take 2 tablets (40 mg total) by mouth daily.   traZODone 100 MG tablet Commonly known as: DESYREL Take 2 tablets (200 mg total) by mouth at bedtime.      Follow-up Information    Florian Buff, MD Follow up on 10/04/2019.   Specialties: Obstetrics and Gynecology, Radiology Why: post op visit Contact information: [place] [address] [phone]           Signed: Florian Buff 09/27/2019, 6:10 PM

## 2019-09-27 NOTE — Progress Notes (Signed)
Foley catheter removed per order.

## 2019-09-27 NOTE — Progress Notes (Signed)
PROGRESS NOTE    Chelsey Santiago  [id number] DOB: [birth year] DOA: 09/26/2019   PCP: Rosita Fire, MD   Brief Narrative:  44 y.o. female with medical history of diabetes mellitus type 2, systolic and diastolic CHF, nonischemic cardiomyopathy with EF 45%, hypertension, OSA, depression/anxiety presenting for direct admission after abdominal supra cervical hysterectomy with bilateral supra salpingectomy performed by Dr. Elonda Husky.  The patient had preoperative clearance from cardiology prior to the operation and preoperative labs on the day of admission were essentially unremarkable except for WBC 14.9.  She also had potassium 3.4 and serum creatinine 0.90.  Hemoglobin preoperatively was 11.9 . Due to the patient's difficult surgery and EBL 1500 cc, patient was directly admitted to the ICU after surgery for postoperative care and close monitoring. At the time of my evaluation, the patient is awake and following simple commands.  She denies any fevers, chills, chest pain, shortness breath, nausea, vomiting, diarrhea,  dysuria, hematuria.  She has some mild postoperative pain in her abdomen but it is controlled.  She feels hungry and wants to eat.  Postoperative labs showed sodium 138, potassium 3.9, chloride 104, CO2 27, BUN 16, creatinine 0.90.  WBC 14.9, hemoglobin 10.9, platelets 237.  Postoperatively, the patient was afebrile and hemodynamically stable with blood pressure 143/94 with oxygen saturation 97-100% on nonrebreather.  Assessment & Plan: 1-history of fibroids and anemia/operative blood loss component -S/p hysterectomy with salpingectomy on 09/26/2019 -Some increased blood loss reported during surgery -2 units PRBCs given -Patient has remained stable hemodynamically speaking. -Hemoglobin 10.3 currently. -No chest pain, no shortness of breath, no nausea or vomiting. -Appears to be ready for discharge with further instructions postoperatively by primary  service.  2-Hypokalemia -repleted and stable -repeat BMET at next visit.  3-Morbid obesity (HCC) -Body mass index is 38.82 kg/m. -Low calorie diet, portion control and increase physical activity discussed with patient.  4-Nonischemic cardiomyopathy ([place]); Chronic combined systolic and diastolic CHF (congestive heart failure) (HCC) -Stable and compensated -No signs of fluid overload, orthopnea or shortness of breath -Patient advised to follow low-sodium diet -To check weight on daily basis and to maintain adequate hydration. -Resume adjusted dose of carvedilol (now 25 mg twice a day), hydralazine (50 mg 3 times a day), Norvasc 5 mg daily resumption of her Entresto and torsemide as previously prescribed. -Outpatient follow-up with cardiology service recommended.  5-Uncontrolled type 2 diabetes mellitus with hyperglycemia (HCC) -resume home hypoglycemic regimen -instructed to follow modified carbohydrates diet  6-HTN -currently soft -will adjust chronic meds to maintain control and prevent symptomatic hypotension -heart healthy diet encouraged  7-HLD -Continue statins  8-depression/anxiety -Continue antidepressant/anxiolytic regimen as previously prescribed -Overall with stable mood and denying suicidal ideation or hallucinations.   DVT prophylaxis: Lovenox Code Status: Full code Family Communication: No family at bedside. Disposition:   Status is: Inpatient  Dispo: The patient is from: Home              Anticipated d/c is to: Home              Anticipated d/c date is: To be determined by OB/GYN              Patient currently with soft BP, but stable otherwise. Ready for discharge from IM standpoint.      Procedures:  Abdominal Hysterectomy with salpingectomy and excision of right vulvar lipoma.   Antimicrobials:  None (received one dose of Unasyn prophylactically)    Subjective: No chest pain, no shortness of breath, no lightheadedness.  Reports  discomfort  around incision in her abdomen otherwise no acute complaints.  Tolerating diet and expressed no nausea or vomiting.  Objective: Vitals:   09/27/19 0900 09/27/19 1203 09/27/19 1217 09/27/19 1300  BP: (!) 93/42  (!) 89/62 (!) 91/50  Pulse: 76     Resp: (!) 30 17 18  (!) 25  Temp:  98 F (36.7 C)    TempSrc:  Oral    SpO2: 98%  99%   Weight:      Height:        Intake/Output Summary (Last 24 hours) at 09/27/2019 1524 Last data filed at 09/27/2019 [id number] Gross per 24 hour  Intake 200.06 ml  Output 1050 ml  Net -849.94 ml   Filed Weights   09/26/19 1229  Weight: 109.1 kg    Examination: General exam: Appears calm and in no major distress.  Complaining of incisional pain.  No nausea, no vomiting, no CP, SOB or lightheadedness. Respiratory system: Clear to auscultation. Respiratory effort normal. Cardiovascular system: S1 & S2 heard, RRR. No JVD, murmurs, rubs, gallops or clicks. No pedal edema. Gastrointestinal system: Abdomen is nondistended, soft and with positive BS. No organomegaly or masses felt. Normal bowel sounds heard.  Incision appear clean and dry. Central nervous system: Alert and oriented. No focal neurological deficits. Extremities: Symmetric 5 x 5 power. Psychiatry: Judgement and insight appear normal. Mood & affect appropriate.     Data Reviewed: I have personally reviewed following labs and imaging studies  CBC: Recent Labs  Lab 09/24/19 0943 09/24/19 0943 09/26/19 0724 09/26/19 1138 09/26/19 1320 09/26/19 2310 09/27/19 0429  WBC 10.7*  --   --  14.9*  --   --  14.2*  NEUTROABS  --   --   --  11.6*  --   --   --   HGB 11.8*   < > 11.9* 10.9* 11.4* 11.2* 10.3*  HCT 38.9   < > 35.0* 35.5* 36.4 35.8* 32.7*  MCV 76.3*  --   --  82.6  --   --  80.9  PLT 331  --   --  237  --   --  209   < > = values in this interval not displayed.    Basic Metabolic Panel: Recent Labs  Lab 09/24/19 0943 09/26/19 0724 09/26/19 1138 09/27/19 0429  NA 133* 140 138 136   K 3.0* 3.4* 3.9 3.7  CL 94* 101 104 101  CO2 28  --  27 27  GLUCOSE 236* 88 93 143*  BUN 15 16 16 12   CREATININE 1.16* 0.90 0.90 0.84  CALCIUM 8.9  --  7.9* 8.0*  MG  --   --  1.5* 2.1    GFR: Estimated Creatinine Clearance: 106.9 mL/min (by C-G formula based on SCr of 0.84 mg/dL).  Liver Function Tests: Recent Labs  Lab 09/24/19 0943  AST 21  ALT 21  ALKPHOS 78  BILITOT 0.4  PROT 8.0  ALBUMIN 3.6    CBG: Recent Labs  Lab 09/26/19 1103 09/26/19 1642 09/26/19 2123 09/27/19 0815 09/27/19 1159  GLUCAP 111* 135* 134* 119* 181*     Recent Results (from the past 240 hour(s))  SARS CORONAVIRUS 2 (TAT 6-24 HRS) Nasopharyngeal Nasopharyngeal Swab     Status: None   Collection Time: 09/24/19  9:10 AM   Specimen: Nasopharyngeal Swab  Result Value Ref Range Status   SARS Coronavirus 2 NEGATIVE NEGATIVE Final    Comment: (NOTE) SARS-CoV-2 target nucleic acids are NOT DETECTED. The SARS-CoV-2  RNA is generally detectable in upper and lower respiratory specimens during the acute phase of infection. Negative results do not preclude SARS-CoV-2 infection, do not rule out co-infections with other pathogens, and should not be used as the sole basis for treatment or other patient management decisions. Negative results must be combined with clinical observations, patient history, and epidemiological information. The expected result is Negative. Fact Sheet for Patients: SugarRoll.be Fact Sheet for Healthcare Providers: https://www.woods-mathews.com/ This test is not yet approved or cleared by the [place] FDA and  has been authorized for detection and/or diagnosis of SARS-CoV-2 by FDA under an Emergency Use Authorization (EUA). This EUA will remain  in effect (meaning this test can be used) for the duration of the COVID-19 declaration under Section 56 4(b)(1) of the Act, 21 U.S.C. section 360bbb-3(b)(1), unless the authorization is  terminated or revoked sooner. Performed at [facility], [place] [address], [place], [place] [address]      Radiology Studies: No results found.   Scheduled Meds: . [START ON 09/28/2019] amLODipine  5 mg Oral Daily  . ARIPiprazole  10 mg Oral Daily  . aspirin EC  81 mg Oral Daily  . atorvastatin  20 mg Oral q1800  . carvedilol  25 mg Oral BID WC  . Chlorhexidine Gluconate Cloth  6 each Topical Daily  . clonazePAM  0.5 mg Oral BID  . docusate sodium  100 mg Oral BID  . enoxaparin (LOVENOX) injection  40 mg Subcutaneous Q24H  . gabapentin  600 mg Oral TID  . hydrALAZINE  50 mg Oral TID  . ibuprofen  800 mg Oral Q6H  . insulin aspart  0-20 Units Subcutaneous TID WC  . insulin aspart  0-5 Units Subcutaneous QHS  . insulin glargine  15 Units Subcutaneous QHS  . linaclotide  145 mcg Oral QAC breakfast  . magnesium oxide  400 mg Oral BID  . mometasone-formoterol  2 puff Inhalation BID  . pantoprazole  40 mg Oral BID AC  . potassium chloride SA  40 mEq Oral Daily  . pyridostigmine  60 mg Oral Q8H  . sacubitril-valsartan  1 tablet Oral BID  . torsemide  40 mg Oral Daily  . traZODone  200 mg Oral QHS   Continuous Infusions: . ondansetron (ZOFRAN) IV       LOS: 1 day    Time spent: 25 minutes.    Barton Dubois, MD [facility]   To contact the attending provider between 7A-7P or the covering provider during after hours 7P-7A, please log into the web site [email or web address] and access using universal [place] password for that web site. If you do not have the password, please call the hospital operator.  09/27/2019, 3:24 PM

## 2019-09-27 NOTE — Progress Notes (Signed)
Patient BP 89/62. MD made aware. No patient complaints.

## 2019-09-27 NOTE — Anesthesia Postprocedure Evaluation (Signed)
Anesthesia Post Note  Patient: Chelsey Santiago  Procedure(s) Performed: HYSTERECTOMY ABDOMINAL WITH SALPINGECTOMY (N/A Abdomen) EXCISION LIPOMA RIGHT VULVAR (Right )  Patient location during evaluation: ICU Anesthesia Type: General Level of consciousness: awake and alert, patient cooperative and awake Pain management: pain level controlled Vital Signs Assessment: post-procedure vital signs reviewed and stable Respiratory status: spontaneous breathing, respiratory function stable and nonlabored ventilation Cardiovascular status: stable Postop Assessment: no apparent nausea or vomiting and able to ambulate Anesthetic complications: no     Last Vitals:  Vitals:   09/27/19 0809 09/27/19 0818  BP:    Pulse:  73  Resp:  (!) 25  Temp:  36.7 C  SpO2: 99% 100%    Last Pain:  Vitals:   09/27/19 0826  TempSrc:   PainSc: 5                  Shakora Nordquist

## 2019-10-01 ENCOUNTER — Other Ambulatory Visit: Payer: Self-pay | Admitting: Obstetrics & Gynecology

## 2019-10-01 ENCOUNTER — Telehealth: Payer: Self-pay | Admitting: Obstetrics & Gynecology

## 2019-10-01 LAB — BPAM RBC
Blood Product Expiration Date: 202105292359
Blood Product Expiration Date: 202106062359
Blood Product Expiration Date: 202106102359
Blood Product Expiration Date: 202106112359
ISSUE DATE / TIME: 202105120904
ISSUE DATE / TIME: 202105120915
ISSUE DATE / TIME: 202105121029
Unit Type and Rh: 9500
Unit Type and Rh: 9500
Unit Type and Rh: 9500
Unit Type and Rh: 9500

## 2019-10-01 LAB — TYPE AND SCREEN
ABO/RH(D): O NEG
Antibody Screen: NEGATIVE
Unit division: 0
Unit division: 0
Unit division: 0
Unit division: 0

## 2019-10-01 NOTE — Telephone Encounter (Signed)
Tell Ahmiya to take a dose of demadex(tosemide) in the morning and in the evening for the next 3 days Then go back to just taking 1 per day as prescribed

## 2019-10-01 NOTE — Telephone Encounter (Signed)
Left message advising to take Demadex in the am and in the pm for the next 3 days then go back to just taking one per day as prescribed. Chelsey Santiago

## 2019-10-01 NOTE — Telephone Encounter (Signed)
Pt states she had a hysterectomy on Wednesday and is wanting to know if leg swelling is normal.

## 2019-10-01 NOTE — Telephone Encounter (Signed)
Pt had a hyst on Wednesday. Pt is having swelling in both legs, starts in thighs and goes into feet. No redness and not warm to touch. First noticed the swelling Saturday. Please advise. Thanks!! Morris

## 2019-10-01 NOTE — Telephone Encounter (Signed)
Left message @ 12:13 pm. JSY

## 2019-10-02 NOTE — Telephone Encounter (Signed)
Pt aware to take Demadex in the am and in the pm for 3 days then go back to just taking one per day as prescribed. Pt voiced understanding. Iselin

## 2019-10-03 ENCOUNTER — Telehealth: Payer: Self-pay | Admitting: Obstetrics & Gynecology

## 2019-10-03 MED ORDER — OXYCODONE HCL 5 MG PO TABS: 5.0000 mg | ORAL_TABLET | ORAL | 0 refills | Status: DC | PRN

## 2019-10-03 NOTE — Telephone Encounter (Signed)
Telephoned patient at home number and advised prescription was called into [place].

## 2019-10-03 NOTE — Telephone Encounter (Signed)
Patient states that she is in severe pain and is requesting her medicine percocet to be sent to her pharmacy at [facility] in [place]

## 2019-10-04 ENCOUNTER — Encounter: Payer: Medicaid Other | Admitting: Obstetrics & Gynecology

## 2019-10-10 ENCOUNTER — Other Ambulatory Visit: Payer: Self-pay | Admitting: Obstetrics & Gynecology

## 2019-10-10 ENCOUNTER — Telehealth: Payer: Self-pay | Admitting: Obstetrics & Gynecology

## 2019-10-10 MED ORDER — OXYCODONE HCL 5 MG PO TABS: 5.0000 mg | ORAL_TABLET | ORAL | 0 refills | Status: DC | PRN

## 2019-10-10 NOTE — Telephone Encounter (Signed)
Called patient back and left message that Dr. Elonda Husky sent in refill. Asked patient to call back for answer to her other question and for other details.

## 2019-10-10 NOTE — Telephone Encounter (Signed)
Per chart review patient had hysterectomy. Will route to Dr. Elonda Husky to see if she can have refill on pain med.

## 2019-10-10 NOTE — Telephone Encounter (Signed)
I sent in a refill but warn her that this is her 3rd post op script, the max she will receive is a 4th which will have only have 20 tabs, so she needs to slow down on her narcotic use  And yes some vaginal spotting and bleeding is expected post operatively as the tissue heals and sutures break down

## 2019-10-10 NOTE — Telephone Encounter (Signed)
Pt states she is having bleeding/spotting after her c section and wants to know if this is normal/common. Also is out of pain medication and is requesting a refill.

## 2019-10-19 ENCOUNTER — Encounter: Payer: Medicaid Other | Admitting: Obstetrics & Gynecology

## 2019-10-25 ENCOUNTER — Telehealth: Payer: Self-pay | Admitting: Obstetrics & Gynecology

## 2019-10-25 ENCOUNTER — Other Ambulatory Visit: Payer: Self-pay | Admitting: Obstetrics & Gynecology

## 2019-10-25 MED ORDER — OXYCODONE HCL 5 MG PO TABS: 5.0000 mg | ORAL_TABLET | ORAL | 0 refills | Status: DC | PRN

## 2019-10-25 NOTE — Telephone Encounter (Signed)
Telephoned patient at home number and advised patient narcotic medication was called but per Dr. Elonda Husky last time no exceptions. Patient voiced understanding.

## 2019-10-25 NOTE — Telephone Encounter (Signed)
Make sure she is aware this is her last refill of narcotic pain medicine.  No exceptions

## 2019-10-25 NOTE — Telephone Encounter (Signed)
Patient called stating that she would like for Dr. Elonda Husky to call her in a refill of her pain medication to [facility]. Please contact pt when done.

## 2019-11-01 ENCOUNTER — Telehealth: Payer: Self-pay | Admitting: Obstetrics & Gynecology

## 2019-11-01 NOTE — Telephone Encounter (Signed)
Telephoned patient at home number and patient states is having vaginal odor for 3 to 4 days, light discharge with yellow tint. Patient states no vaginal itching. Patient was on Augmentin 09/26/2019 for 10 days. Consulted with Dr Glo Herring and will call in metrogel. Advised patient to chek with pharmacy later today.

## 2019-11-01 NOTE — Telephone Encounter (Signed)
Patient states she has odor from incision where she had surgery, she also has some bleeding. Scheduled first available for patient to see dr Elonda Husky as post op.

## 2019-11-02 ENCOUNTER — Other Ambulatory Visit: Payer: Self-pay | Admitting: Obstetrics and Gynecology

## 2019-11-02 ENCOUNTER — Emergency Department (HOSPITAL_COMMUNITY)
Admission: EM | Admit: 2019-11-02 | Discharge: 2019-11-02 | Disposition: A | Discharge status: Home / Self Care | Payer: Medicaid Other | Attending: Emergency Medicine | Admitting: Emergency Medicine

## 2019-11-02 ENCOUNTER — Other Ambulatory Visit: Payer: Self-pay

## 2019-11-02 DIAGNOSIS — I5042 Chronic combined systolic (congestive) and diastolic (congestive) heart failure: Secondary | ICD-10-CM | POA: Insufficient documentation

## 2019-11-02 DIAGNOSIS — Z7982 Long term (current) use of aspirin: Secondary | ICD-10-CM | POA: Diagnosis not present

## 2019-11-02 DIAGNOSIS — I110 Hypertensive heart disease with heart failure: Secondary | ICD-10-CM | POA: Diagnosis not present

## 2019-11-02 DIAGNOSIS — E119 Type 2 diabetes mellitus without complications: Secondary | ICD-10-CM | POA: Insufficient documentation

## 2019-11-02 DIAGNOSIS — L7682 Other postprocedural complications of skin and subcutaneous tissue: Secondary | ICD-10-CM | POA: Diagnosis not present

## 2019-11-02 DIAGNOSIS — Z79899 Other long term (current) drug therapy: Secondary | ICD-10-CM | POA: Diagnosis not present

## 2019-11-02 DIAGNOSIS — T888XXA Other specified complications of surgical and medical care, not elsewhere classified, initial encounter: Secondary | ICD-10-CM | POA: Diagnosis present

## 2019-11-02 MED ORDER — TETANUS-DIPHTH-ACELL PERTUSSIS 5-2.5-18.5 LF-MCG/0.5 IM SUSP
0.5000 mL | Freq: Once | INTRAMUSCULAR | Status: AC
  Administered 2019-11-02: 0.5 mL via INTRAMUSCULAR
  Filled 2019-11-02: qty 0.5

## 2019-11-02 MED ORDER — CEPHALEXIN 500 MG PO CAPS: ORAL_CAPSULE | ORAL | 0 refills | Status: DC

## 2019-11-02 MED ORDER — METRONIDAZOLE 0.75 % VA GEL
1.0000 | Freq: Every day | VAGINAL | 1 refills | Status: DC
Start: 2019-11-02 — End: 2019-12-19

## 2019-11-02 MED ORDER — IBUPROFEN 800 MG PO TABS
800.0000 mg | ORAL_TABLET | Freq: Once | ORAL | Status: AC
  Administered 2019-11-02: 800 mg via ORAL
  Filled 2019-11-02: qty 1

## 2019-11-02 NOTE — Discharge Instructions (Signed)
Please continue to apply warm moist compress to affected area to help with healing.  Take antibiotic as prescribed and follow-up closely with your gynecologist for further management of your condition.

## 2019-11-02 NOTE — ED Notes (Signed)
Pt stating she needs to leave due to a "family emergency." EDP aware. Pt walked out without being able to receive d/c papers or sign d/c paperwork.

## 2019-11-02 NOTE — ED Triage Notes (Signed)
Patient states that she had a hysterectomy on May the 12 th. Patient states that the incision has busted open and is burning and bleeding.

## 2019-11-02 NOTE — Telephone Encounter (Signed)
Rx sent in. Please notify patient

## 2019-11-02 NOTE — ED Provider Notes (Signed)
[facility] EMERGENCY DEPARTMENT Provider Note   CSN: [id number] Arrival date & time: 11/02/19  1929     History Chief Complaint  Patient presents with  . post surgery problem    Chelsey Santiago is a 44 y.o. female.  The history is provided by the patient. No language interpreter was used.     44 year old female recently had a hysterectomy presenting complaining of concerns of incision site.  Patient states she had a hysterectomy on May 12.  She has been doing fine however 2 days ago she felt something torn and subsequently noted some fluid oozing out from her incision site.  She reports the area is tender and burning sensation with some yellow fluid draining out of it.  No associated fever no nausea or vomiting.  She did reach out to her gynecologist and does have an appointment in several days but she voids concerning for potential infection prompting this ER visit.  Sharp burning pain is 8 out of 10, nonradiating.  She is unable to recall last tetanus status.  Past Medical History:  Diagnosis Date  . Anemia    H&H of 10.6/33 and 07/2008 and 11.9/35 and 09/2010  . Anxiety   . Chronic combined systolic and diastolic CHF (congestive heart failure) ([place])   . Depression with anxiety   . Diabetes mellitus without complication ([place])   . Hypertension   . Hypertensive heart disease 2009   Pulmonary edema postpartum; mild to moderate mitral regurgitation when hospitalized for CHF in 2009; Echocardiogram in 12/2009-no MR and normal EF; normal CXR in 09/2010  . Migraine headache   . Miscarriage 03/19/2013  . Obesity 04/16/2009  . Osteoarthritis, knee 03/29/2011  . Preeclampsia   . Pulmonary edema   . Sleep apnea   . Threatened abortion in early pregnancy 03/15/2013    Patient Active Problem List   Diagnosis Date Noted  . Fibroids 09/26/2019  . S/P hysterectomy 09/26/2019  . Acute blood loss anemia 09/26/2019  . Abdominal pain, epigastric 03/14/2019  . Dysphagia 03/14/2019  .  Gastroesophageal reflux disease 03/14/2019  . Class 2 obesity   . Acute exacerbation of CHF (congestive heart failure) ([place]) 10/26/2018  . Chronic combined systolic and diastolic CHF (congestive heart failure) ([place]) 06/02/2018  . Uncontrolled type 2 diabetes mellitus with hyperglycemia ([place]) 06/02/2018  . Influenza B 05/13/2018  . Hypomagnesemia 05/12/2018  . Headache 05/12/2018  . Upper respiratory tract infection   . HCAP (healthcare-associated pneumonia)   . Constipation 10/17/2017  . Bad headache   . CHF exacerbation ([place]) 09/29/2017  . Iron deficiency anemia 05/16/2017  . Vitamin D deficiency 11/25/2016  . Iron deficiency 11/25/2016  . History of acute myocardial infarction 10/05/2016  . CHF (congestive heart failure) ([place]) 05/06/2016  . Diabetes mellitus with complication ([place]) 28/31/5176  . Leukocytosis 05/06/2016  . Neuropathy 05/06/2016  . Depression 04/16/2016  . Chronic tension-type headache, not intractable 04/16/2016  . AKI (acute kidney injury) ([place])   . Hyperkalemia   . Nonischemic cardiomyopathy ([place])   . Acute on chronic combined systolic and diastolic ACC/AHA stage C congestive heart failure ([place]) 04/03/2016  . Acute on chronic combined systolic and diastolic CHF, NYHA class 4 ([place]) 04/03/2016  . Hypertensive emergency 04/03/2016  . Cardiomyopathy due to hypertension ([place]) 12/22/2015  . Normal coronary arteries 12/22/2015  . Troponin level elevated 12/22/2015  . NSTEMI (non-ST elevated myocardial infarction) ([place]) 12/20/2015  . Dental infection 10/10/2015  . Chest pain 09/05/2015  . Systolic CHF, chronic ([place]) 09/05/2015  .  LLQ pain   . Type 2 diabetes mellitus without complication ([place]) 29/92/4268  . Essential hypertension   . 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 Hypertensive urgency 04/22/2014  . Acute CHF ([place]) 04/22/2014  . S/P cesarean section 04/11/2014  . Acute pulmonary edema ([place]) 04/11/2014  . Postoperative anemia 04/11/2014  . Elevated serum  creatinine 04/11/2014  . Preeclampsia, severe 04/09/2014  . Pre-eclampsia superimposed on chronic hypertension, antepartum 04/08/2014  . Dyspnea 04/08/2014  . Polyhydramnios in third trimester, antepartum 03/14/2014  . Abnormal maternal glucose tolerance, antepartum 03/11/2014  . High-risk pregnancy 03/11/2014  . Pre-existing essential hypertension complicating pregnancy 34/19/6222  . Impaired glucose tolerance during pregnancy, antepartum 11/27/2013  . Leiomyoma of uterus 11/22/2013  . History of gestational diabetes in prior pregnancy, currently pregnant in first trimester 11/22/2013  . Hx of preeclampsia, prior pregnancy, currently pregnant 11/22/2013  . Short interval between pregnancies affecting pregnancy, antepartum 11/22/2013  . Supervision of high-risk pregnancy of elderly primigravida (>= 50 years old at delivery), third trimester 11/22/2013  . Miscarriage 03/19/2013  . Major depressive disorder, single episode, unspecified 09/27/2011  . Hypertension   . Hypertensive cardiovascular disease   . Microcytic anemia   . Osteoarthrosis involving lower leg 03/29/2011  . Hypokalemia 12/12/2009  . OSA on CPAP 12/09/2009  . Morbid obesity ([place]) 04/16/2009    Past Surgical History:  Procedure Laterality Date  . BREAST REDUCTION SURGERY  2002  . CARDIAC CATHETERIZATION N/A 12/22/2015   Procedure: Left Heart Cath and Coronary Angiography;  Surgeon: Peter M Martinique, MD;  Location: [facility];  Service: Cardiovascular;  Laterality: N/A;  . CESAREAN SECTION N/A 04/09/2014   Procedure: CESAREAN SECTION;  Surgeon: Mora Bellman, MD;  Location: [facility];  Service: Obstetrics;  Laterality: N/A;  . CHOLECYSTECTOMY    . HYSTERECTOMY ABDOMINAL WITH SALPINGECTOMY N/A 09/26/2019   Procedure: HYSTERECTOMY ABDOMINAL WITH SALPINGECTOMY;  Surgeon: Florian Buff, MD;  Location: [facility];  Service: Gynecology;  Laterality: N/A;  . LIPOMA EXCISION Right 09/26/2019   Procedure: EXCISION LIPOMA RIGHT  VULVAR;  Surgeon: Florian Buff, MD;  Location: [facility];  Service: Gynecology;  Laterality: Right;     OB History    Gravida  11   Para  6   Term  5   Preterm  1   AB  5   Living  6     SAB  3   TAB  2   Ectopic      Multiple  0   Live Births  6           Family History  Problem Relation Age of Onset  . Diabetes Mother 104  . Heart disease Mother   . Hyperlipidemia Paternal Grandfather   . Hypertension Paternal Grandfather   . Heart disease Father   . Hypertension Father   . Heart disease Maternal Grandmother 60  . ADD / ADHD Son   . Hypertension Maternal Uncle   . Heart attack Brother   . Sudden death Neg Hx   . Colon cancer Neg Hx   . Celiac disease Neg Hx   . Inflammatory bowel disease Neg Hx     Social History   Tobacco Use  . Smoking status: Never Smoker  . Smokeless tobacco: Never Used  Vaping Use  . Vaping Use: Never used  Substance Use Topics  . Alcohol use: Yes    Comment: occ  . Drug use: No    Home Medications Prior to Admission medications  Medication Sig Start Date End Date Taking? Authorizing Provider  albuterol (PROVENTIL HFA;VENTOLIN HFA) 108 (90 Base) MCG/ACT inhaler Inhale 1 puff into the lungs every 6 (six) hours as needed for wheezing or shortness of breath.    [provider]  amLODipine (NORVASC) 10 MG tablet Take 1 tablet (10 mg total) by mouth daily. 09/04/19 12/03/19  Strader, Fransisco Hertz, PA-C  amoxicillin-clavulanate (AUGMENTIN) 875-125 MG tablet Take 1 tablet by mouth 2 (two) times daily. 09/27/19   Florian Buff, MD  ARIPiprazole (ABILIFY) 10 MG tablet Take 1 tablet (10 mg total) by mouth daily. 10/30/18   Barton Dubois, MD  aspirin EC 81 MG tablet Take 81 mg by mouth daily.    [provider]  atorvastatin (LIPITOR) 20 MG tablet Take 1 tablet (20 mg total) by mouth daily at 6 PM. 06/03/18   Tat, Shanon Brow, MD  budesonide-formoterol [facility]) 80-4.5 MCG/ACT inhaler Inhale 2 puffs into the lungs 2 (two)  times daily. 05/04/18   Manuella Ghazi, Pratik D, DO  carvedilol (COREG) 25 MG tablet TAKE 2 TABLETS BY MOUTH TWICE DAILY WITH A MEAL. Patient taking differently: Take 50 mg by mouth 2 (two) times daily with a meal.  08/09/19   Branch, Alphonse Guild, MD  clonazePAM (KLONOPIN) 0.5 MG tablet Take 0.5 mg by mouth 2 (two) times daily. 12/19/18   [provider]  gabapentin (NEURONTIN) 600 MG tablet Take 600 mg by mouth 3 (three) times daily.     [provider]  hydrALAZINE (APRESOLINE) 100 MG tablet Take 1 tablet (100 mg total) by mouth 3 (three) times daily. 06/03/18   Orson Eva, MD  ibuprofen (ADVIL) 800 MG tablet Take 1 tablet (800 mg total) by mouth every 6 (six) hours. 09/28/19   Florian Buff, MD  insulin glargine (LANTUS) 100 UNIT/ML injection Inject 30 Units at bedtime into the skin.     [provider]  insulin lispro (HUMALOG) 100 UNIT/ML injection Inject 15 Units into the skin 3 (three) times daily before meals.     [provider]  Ipratropium-Albuterol (COMBIVENT RESPIMAT) 20-100 MCG/ACT AERS respimat Inhale 1 puff into the lungs every 6 (six) hours as needed for wheezing or shortness of breath. 05/04/18   Manuella Ghazi, Pratik D, DO  ketorolac (TORADOL) 10 MG tablet TAKE (1) TABLET BY MOUTH EVERY EIGHT HOURS AS NEEDED. Patient not taking: Reported on 09/21/2019 09/06/19   Margette Fast, MD  linaclotide [facility]) 145 MCG CAPS capsule Take 1 capsule (145 mcg total) by mouth daily before breakfast. 03/14/19   Erenest Rasher, PA-C  Magnesium 400 MG CAPS Take 400 mg by mouth 2 (two) times daily. 06/05/19   Dunn, Nedra Hai, PA-C  methocarbamol (ROBAXIN) 500 MG tablet Take 1 tablet (500 mg total) by mouth every 8 (eight) hours as needed for muscle spasms. Patient not taking: Reported on 09/21/2019 01/02/19   Davonna Belling, MD  metroNIDAZOLE (METROGEL) 0.75 % vaginal gel Place 1 Applicatorful vaginally at bedtime. May use every other night to allow for longer therapy. 11/02/19   Jonnie Kind, MD  nitroGLYCERIN (NITROSTAT) 0.4 MG SL tablet Place 1 tablet (0.4 mg total) under the tongue every 5 (five) minutes as needed for chest pain. 09/05/15   Kathie Dike, MD  ondansetron (ZOFRAN ODT) 8 MG disintegrating tablet Take 1 tablet (8 mg total) by mouth every 8 (eight) hours as needed for nausea or vomiting. 09/06/19   Long, Wonda Olds, MD  ondansetron (ZOFRAN ODT) 8 MG disintegrating tablet Take  1 tablet (8 mg total) by mouth every 8 (eight) hours as needed for nausea or vomiting. 09/27/19   Florian Buff, MD  oxyCODONE (OXY IR/ROXICODONE) 5 MG immediate release tablet Take 1-2 tablets (5-10 mg total) by mouth every 4 (four) hours as needed for moderate pain. 10/25/19   Florian Buff, MD  oxyCODONE-acetaminophen (PERCOCET/ROXICET) 5-325 MG tablet Take 1 tablet by mouth every 4 (four) hours as needed. 09/24/19   Jonnie Kind, MD  pantoprazole (PROTONIX) 40 MG tablet Take 1 tablet (40 mg total) by mouth 2 (two) times daily before a meal. 03/14/19   Erenest Rasher, PA-C  potassium chloride SA (KLOR-CON M20) 20 MEQ tablet Take 2 tablets (40 mEq total) by mouth daily. 09/04/19 12/03/19  Strader, Fransisco Hertz, PA-C  promethazine (PHENERGAN) 25 MG tablet Take 1 tablet (25 mg total) by mouth every 6 (six) hours as needed for nausea or vomiting. 02/06/19   Idol, Almyra Free, PA-C  pyridostigmine (MESTINON) 60 MG tablet Take 60 mg by mouth every 8 (eight) hours.    [provider]  sacubitril-valsartan (ENTRESTO) 97-103 MG Take 1 tablet by mouth 2 (two) times daily. 03/16/19   Arnoldo Lenis, MD  torsemide (DEMADEX) 20 MG tablet Take 2 tablets (40 mg total) by mouth daily. 09/04/19 12/03/19  Strader, Fransisco Hertz, PA-C  traZODone (DESYREL) 100 MG tablet Take 2 tablets (200 mg total) by mouth at bedtime. 10/29/18   Barton Dubois, MD    Allergies    Diclofenac, Tramadol, Vicodin [hydrocodone-acetaminophen], and Hydrocodone bitartrate er  Review of Systems   Review of Systems    Constitutional: Negative for fever.  Skin: Positive for wound.  Neurological: Negative for numbness.    Physical Exam Updated Vital Signs BP (!) 167/118 (BP Location: Right Arm)   Pulse 83   Temp 98.9 F (37.2 C) (Oral)   Resp 18   Ht 5\' 6"  (1.676 m)   Wt 104.3 kg   LMP 08/21/2019   SpO2 99%   BMI 37.12 kg/m   Physical Exam Vitals and nursing note reviewed.  Constitutional:      General: She is not in acute distress.    Appearance: She is well-developed.  HENT:     Head: Atraumatic.  Eyes:     Conjunctiva/sclera: Conjunctivae normal.  Abdominal:     Palpations: Abdomen is soft.     Comments: Well-appearing horizontal with a small opening approximately 72mm in diameter, oozing out serous fluid and mildly tender to palpation.  No surrounding skin erythema and no obvious abscess palpable.  Musculoskeletal:     Cervical back: Neck supple.  Skin:    Findings: No rash.  Neurological:     Mental Status: She is alert.     ED Results / Procedures / Treatments   Labs (all labs ordered are listed, but only abnormal results are displayed) Labs Reviewed - No data to display  EKG None  Radiology No results found.  Procedures Procedures (including critical care time)  Medications Ordered in ED Medications  Tdap (BOOSTRIX) injection 0.5 mL (0.5 mLs Intramuscular Given 11/02/19 2123)  ibuprofen (ADVIL) tablet 800 mg (800 mg Oral Given 11/02/19 2122)    ED Course  I have reviewed the triage vital signs and the nursing notes.  Pertinent labs & imaging results that were available during my care of the patient were reviewed by me and considered in my medical decision making (see chart for details).    MDM Rules/Calculators/A&P  BP (!) 167/118 (BP Location: Right Arm)   Pulse 83   Temp 98.9 F (37.2 C) (Oral)   Resp 18   Ht 5\' 6"  (1.676 m)   Wt 104.3 kg   LMP 08/21/2019   SpO2 99%   BMI 37.12 kg/m   Final Clinical Impression(s) / ED  Diagnoses Final diagnoses:  Postoperative surgical complication involving skin associated with non-dermatologic procedure, unspecified complication    Rx / DC Orders ED Discharge Orders         Ordered    cephALEXin (KEFLEX) 500 MG capsule     Discontinue  Reprint     11/02/19 2122         9:15 PM Patient had a small dehiscence of her recently hysterectomy that was done approximately a month ago.  There is a small amount of serous fluid draining however no obvious pus however patient does voice concern for infection due to the tenderness of the area, will initiate antibiotic.  Recommend ibuprofen for pain.  Will update Tdap.  She will follow-up with her gynecologist in the next few days for further management.  I have low suspicion for deep tissue infection.   Domenic Moras, PA-C 11/07/19 1505    Daleen Bo, MD 11/08/19 [phone]

## 2019-11-06 ENCOUNTER — Ambulatory Visit (INDEPENDENT_AMBULATORY_CARE_PROVIDER_SITE_OTHER): Payer: Medicaid Other | Admitting: Obstetrics & Gynecology

## 2019-11-06 ENCOUNTER — Encounter: Payer: Self-pay | Admitting: Obstetrics & Gynecology

## 2019-11-06 VITALS — BP 189/114 | HR 93 | Ht 66.0 in | Wt 225.0 lb

## 2019-11-06 DIAGNOSIS — Z90710 Acquired absence of both cervix and uterus: Secondary | ICD-10-CM

## 2019-11-06 NOTE — Progress Notes (Signed)
Patient ID: Chelsey Santiago, female   DOB: [birth year], 44 y.o.   MRN: [id number]  HPI: Patient returns for routine postoperative follow-up having undergone TAH  on 09/26/19.  The patient's immediate postoperative recovery has been unremarkable. Since hospital discharge the patient reports had some discharge from the right corner of her incision consistent with seroma.   Current Outpatient Medications: albuterol (PROVENTIL HFA;VENTOLIN HFA) 108 (90 Base) MCG/ACT inhaler, Inhale 1 puff into the lungs every 6 (six) hours as needed for wheezing or shortness of breath., Disp: , Rfl:  amLODipine (NORVASC) 10 MG tablet, Take 1 tablet (10 mg total) by mouth daily., Disp: 90 tablet, Rfl: 3 amoxicillin-clavulanate (AUGMENTIN) 875-125 MG tablet, Take 1 tablet by mouth 2 (two) times daily., Disp: 20 tablet, Rfl: 0 ARIPiprazole (ABILIFY) 10 MG tablet, Take 1 tablet (10 mg total) by mouth daily., Disp:  , Rfl:  aspirin EC 81 MG tablet, Take 81 mg by mouth daily., Disp: , Rfl:  atorvastatin (LIPITOR) 20 MG tablet, Take 1 tablet (20 mg total) by mouth daily at 6 PM., Disp: 30 tablet, Rfl: 1 budesonide-formoterol (SYMBICORT) 80-4.5 MCG/ACT inhaler, Inhale 2 puffs into the lungs 2 (two) times daily., Disp: 1 Inhaler, Rfl: 12 carvedilol (COREG) 25 MG tablet, TAKE 2 TABLETS BY MOUTH TWICE DAILY WITH A MEAL. (Patient taking differently: Take 50 mg by mouth 2 (two) times daily with a meal. ), Disp: 60 tablet, Rfl: 0 cephALEXin (KEFLEX) 500 MG capsule, 2 caps po bid x 7 days, Disp: 28 capsule, Rfl: 0 clonazePAM (KLONOPIN) 0.5 MG tablet, Take 0.5 mg by mouth 2 (two) times daily., Disp: , Rfl:  gabapentin (NEURONTIN) 600 MG tablet, Take 600 mg by mouth 3 (three) times daily. , Disp: , Rfl:  hydrALAZINE (APRESOLINE) 100 MG tablet, Take 1 tablet (100 mg total) by mouth 3 (three) times daily., Disp: 30 tablet, Rfl: 1 ibuprofen (ADVIL) 800 MG tablet, Take 1 tablet (800 mg total) by mouth every 6 (six) hours., Disp: 30 tablet,  Rfl: 0 insulin glargine (LANTUS) 100 UNIT/ML injection, Inject 30 Units at bedtime into the skin. , Disp: , Rfl:  insulin lispro (HUMALOG) 100 UNIT/ML injection, Inject 15 Units into the skin 3 (three) times daily before meals. , Disp: , Rfl:  Ipratropium-Albuterol (COMBIVENT RESPIMAT) 20-100 MCG/ACT AERS respimat, Inhale 1 puff into the lungs every 6 (six) hours as needed for wheezing or shortness of breath., Disp: 1 Inhaler, Rfl: 0 ketorolac (TORADOL) 10 MG tablet, TAKE (1) TABLET BY MOUTH EVERY EIGHT HOURS AS NEEDED., Disp: 15 tablet, Rfl: 0 linaclotide (LINZESS) 145 MCG CAPS capsule, Take 1 capsule (145 mcg total) by mouth daily before breakfast., Disp: 30 capsule, Rfl: 5 Magnesium 400 MG CAPS, Take 400 mg by mouth 2 (two) times daily., Disp: 180 capsule, Rfl: 3 methocarbamol (ROBAXIN) 500 MG tablet, Take 1 tablet (500 mg total) by mouth every 8 (eight) hours as needed for muscle spasms., Disp: 8 tablet, Rfl: 0 metroNIDAZOLE (METROGEL) 0.75 % vaginal gel, Place 1 Applicatorful vaginally at bedtime. May use every other night to allow for longer therapy., Disp: 70 g, Rfl: 1 nitroGLYCERIN (NITROSTAT) 0.4 MG SL tablet, Place 1 tablet (0.4 mg total) under the tongue every 5 (five) minutes as needed for chest pain., Disp: 30 tablet, Rfl: 12 ondansetron (ZOFRAN ODT) 8 MG disintegrating tablet, Take 1 tablet (8 mg total) by mouth every 8 (eight) hours as needed for nausea or vomiting., Disp: 20 tablet, Rfl: 0 ondansetron (ZOFRAN ODT) 8 MG disintegrating tablet, Take 1 tablet (8 mg total)  by mouth every 8 (eight) hours as needed for nausea or vomiting., Disp: 20 tablet, Rfl: 0 oxyCODONE (OXY IR/ROXICODONE) 5 MG immediate release tablet, Take 1-2 tablets (5-10 mg total) by mouth every 4 (four) hours as needed for moderate pain., Disp: 30 tablet, Rfl: 0 oxyCODONE-acetaminophen (PERCOCET/ROXICET) 5-325 MG tablet, Take 1 tablet by mouth every 4 (four) hours as needed., Disp: 20 tablet, Rfl: 0 pantoprazole  (PROTONIX) 40 MG tablet, Take 1 tablet (40 mg total) by mouth 2 (two) times daily before a meal., Disp: 60 tablet, Rfl: 5 potassium chloride SA (KLOR-CON M20) 20 MEQ tablet, Take 2 tablets (40 mEq total) by mouth daily., Disp: 180 tablet, Rfl: 3 promethazine (PHENERGAN) 25 MG tablet, Take 1 tablet (25 mg total) by mouth every 6 (six) hours as needed for nausea or vomiting., Disp: 30 tablet, Rfl: 0 pyridostigmine (MESTINON) 60 MG tablet, Take 60 mg by mouth every 8 (eight) hours., Disp: , Rfl:  sacubitril-valsartan (ENTRESTO) 97-103 MG, Take 1 tablet by mouth 2 (two) times daily., Disp: 60 tablet, Rfl: 6 torsemide (DEMADEX) 20 MG tablet, Take 2 tablets (40 mg total) by mouth daily., Disp: 120 tablet, Rfl: 11 traZODone (DESYREL) 100 MG tablet, Take 2 tablets (200 mg total) by mouth at bedtime., Disp:  , Rfl:   No current facility-administered medications for this visit.    Blood pressure (!) 189/114, pulse 93, height 5\' 6"  (1.676 m), weight 225 lb (102.1 kg), last menstrual period 08/21/2019.  Physical Exam: Incision clean dry intact Cuff well healed Pelvic exam normal  OK for intercourse  Diagnostic Tests:   Pathology: benign  Impression: S/P TAH  Plan:   Follow up: 1  years  Florian Buff, MD

## 2019-11-09 ENCOUNTER — Ambulatory Visit: Payer: Medicaid Other | Admitting: Student

## 2019-11-09 NOTE — Progress Notes (Deleted)
Cardiology Office Note    Date:  11/09/2019   ID:  Chelsey Santiago, DOB 01-Oct-1975, MRN [id number]  PCP:  Rosita Fire, MD  Cardiologist: Carlyle Dolly, MD    No chief complaint on file.   History of Present Illness:    Chelsey Santiago is a 44 y.o. female with past medical history of chronic combined systolic and diastolic CHF/NICM (EF 78-46% by echo in 12/2015 with cath showing normal cors, EF at 45% by repeat echo in 10/2018), HTN, IDDM and OSA who presents to the office today for 22-month follow-up.  She was last examined by myself in 08/2019 and denied any recent chest pain or palpitations at that time. BP was significantly elevated at 160/115 and she was continued on Coreg 50 mg twice daily, Entresto 97-103 mg twice daily and Torsemide 40 mg daily with Amlodipine being titrated from 5 mg daily to 10 mg daily. She had requested preoperative cardiac clearance for hysterectomy but this was postponed until BP had improed. BP was improved at 122/68 at the time of her nurse visit on 09/06/2019 and she was cleared to proceed with planned hysterectomy.  She did undergo abdominal hysterectomy on 09/26/2019 by Dr. Elonda Husky.  It appears she was actually hypotensive in the postoperative setting and carvedilol was reduced to 25 mg twice daily at the time of discharge.   Past Medical History:  Diagnosis Date  . Anemia    H&H of 10.6/33 and 07/2008 and 11.9/35 and 09/2010  . Anxiety   . Chronic combined systolic and diastolic CHF (congestive heart failure) ([place])   . Depression with anxiety   . Diabetes mellitus without complication ([place])   . Hypertension   . Hypertensive heart disease 2009   Pulmonary edema postpartum; mild to moderate mitral regurgitation when hospitalized for CHF in 2009; Echocardiogram in 12/2009-no MR and normal EF; normal CXR in 09/2010  . Migraine headache   . Miscarriage 03/19/2013  . Obesity 04/16/2009  . Osteoarthritis, knee 03/29/2011  . Preeclampsia   . Pulmonary  edema   . Sleep apnea   . Threatened abortion in early pregnancy 03/15/2013    Past Surgical History:  Procedure Laterality Date  . BREAST REDUCTION SURGERY  2002  . CARDIAC CATHETERIZATION N/A 12/22/2015   Procedure: Left Heart Cath and Coronary Angiography;  Surgeon: Peter M Martinique, MD;  Location: [facility];  Service: Cardiovascular;  Laterality: N/A;  . CESAREAN SECTION N/A 04/09/2014   Procedure: CESAREAN SECTION;  Surgeon: Mora Bellman, MD;  Location: [facility];  Service: Obstetrics;  Laterality: N/A;  . CHOLECYSTECTOMY    . HYSTERECTOMY ABDOMINAL WITH SALPINGECTOMY N/A 09/26/2019   Procedure: HYSTERECTOMY ABDOMINAL WITH SALPINGECTOMY;  Surgeon: Florian Buff, MD;  Location: [facility];  Service: Gynecology;  Laterality: N/A;  . LIPOMA EXCISION Right 09/26/2019   Procedure: EXCISION LIPOMA RIGHT VULVAR;  Surgeon: Florian Buff, MD;  Location: [facility];  Service: Gynecology;  Laterality: Right;    Current Medications: Outpatient Medications Prior to Visit  Medication Sig Dispense Refill  . albuterol (PROVENTIL HFA;VENTOLIN HFA) 108 (90 Base) MCG/ACT inhaler Inhale 1 puff into the lungs every 6 (six) hours as needed for wheezing or shortness of breath.    Marland Kitchen amLODipine (NORVASC) 10 MG tablet Take 1 tablet (10 mg total) by mouth daily. 90 tablet 3  . amoxicillin-clavulanate (AUGMENTIN) 875-125 MG tablet Take 1 tablet by mouth 2 (two) times daily. 20 tablet 0  . ARIPiprazole (ABILIFY) 10 MG tablet Take 1 tablet (10 mg  total) by mouth daily.    Marland Kitchen aspirin EC 81 MG tablet Take 81 mg by mouth daily.    Marland Kitchen atorvastatin (LIPITOR) 20 MG tablet Take 1 tablet (20 mg total) by mouth daily at 6 PM. 30 tablet 1  . budesonide-formoterol (SYMBICORT) 80-4.5 MCG/ACT inhaler Inhale 2 puffs into the lungs 2 (two) times daily. 1 Inhaler 12  . carvedilol (COREG) 25 MG tablet TAKE 2 TABLETS BY MOUTH TWICE DAILY WITH A MEAL. (Patient taking differently: Take 50 mg by mouth 2 (two) times daily with a meal. ) 60  tablet 0  . cephALEXin (KEFLEX) 500 MG capsule 2 caps po bid x 7 days 28 capsule 0  . clonazePAM (KLONOPIN) 0.5 MG tablet Take 0.5 mg by mouth 2 (two) times daily.    Marland Kitchen gabapentin (NEURONTIN) 600 MG tablet Take 600 mg by mouth 3 (three) times daily.     . hydrALAZINE (APRESOLINE) 100 MG tablet Take 1 tablet (100 mg total) by mouth 3 (three) times daily. 30 tablet 1  . ibuprofen (ADVIL) 800 MG tablet Take 1 tablet (800 mg total) by mouth every 6 (six) hours. 30 tablet 0  . insulin glargine (LANTUS) 100 UNIT/ML injection Inject 30 Units at bedtime into the skin.     Marland Kitchen insulin lispro (HUMALOG) 100 UNIT/ML injection Inject 15 Units into the skin 3 (three) times daily before meals.     . Ipratropium-Albuterol (COMBIVENT RESPIMAT) 20-100 MCG/ACT AERS respimat Inhale 1 puff into the lungs every 6 (six) hours as needed for wheezing or shortness of breath. 1 Inhaler 0  . ketorolac (TORADOL) 10 MG tablet TAKE (1) TABLET BY MOUTH EVERY EIGHT HOURS AS NEEDED. 15 tablet 0  . linaclotide (LINZESS) 145 MCG CAPS capsule Take 1 capsule (145 mcg total) by mouth daily before breakfast. 30 capsule 5  . Magnesium 400 MG CAPS Take 400 mg by mouth 2 (two) times daily. 180 capsule 3  . methocarbamol (ROBAXIN) 500 MG tablet Take 1 tablet (500 mg total) by mouth every 8 (eight) hours as needed for muscle spasms. 8 tablet 0  . metroNIDAZOLE (METROGEL) 0.75 % vaginal gel Place 1 Applicatorful vaginally at bedtime. May use every other night to allow for longer therapy. 70 g 1  . nitroGLYCERIN (NITROSTAT) 0.4 MG SL tablet Place 1 tablet (0.4 mg total) under the tongue every 5 (five) minutes as needed for chest pain. 30 tablet 12  . ondansetron (ZOFRAN ODT) 8 MG disintegrating tablet Take 1 tablet (8 mg total) by mouth every 8 (eight) hours as needed for nausea or vomiting. 20 tablet 0  . ondansetron (ZOFRAN ODT) 8 MG disintegrating tablet Take 1 tablet (8 mg total) by mouth every 8 (eight) hours as needed for nausea or vomiting.  20 tablet 0  . oxyCODONE (OXY IR/ROXICODONE) 5 MG immediate release tablet Take 1-2 tablets (5-10 mg total) by mouth every 4 (four) hours as needed for moderate pain. 30 tablet 0  . oxyCODONE-acetaminophen (PERCOCET/ROXICET) 5-325 MG tablet Take 1 tablet by mouth every 4 (four) hours as needed. 20 tablet 0  . pantoprazole (PROTONIX) 40 MG tablet Take 1 tablet (40 mg total) by mouth 2 (two) times daily before a meal. 60 tablet 5  . potassium chloride SA (KLOR-CON M20) 20 MEQ tablet Take 2 tablets (40 mEq total) by mouth daily. 180 tablet 3  . promethazine (PHENERGAN) 25 MG tablet Take 1 tablet (25 mg total) by mouth every 6 (six) hours as needed for nausea or vomiting. 30 tablet 0  .  pyridostigmine (MESTINON) 60 MG tablet Take 60 mg by mouth every 8 (eight) hours.    . sacubitril-valsartan (ENTRESTO) 97-103 MG Take 1 tablet by mouth 2 (two) times daily. 60 tablet 6  . torsemide (DEMADEX) 20 MG tablet Take 2 tablets (40 mg total) by mouth daily. 120 tablet 11  . traZODone (DESYREL) 100 MG tablet Take 2 tablets (200 mg total) by mouth at bedtime.     No facility-administered medications prior to visit.     Allergies:   Diclofenac, Tramadol, and Vicodin [hydrocodone-acetaminophen]   Social History   Socioeconomic History  . Marital status: Married    Spouse name: Not on file  . Number of children: 6  . Years of education: Not on file  . Highest education level: Not on file  Occupational History  . Occupation: unemployed    Fish farm manager: UNEMPLOYED  Tobacco Use  . Smoking status: Never Smoker  . Smokeless tobacco: Never Used  Vaping Use  . Vaping Use: Never used  Substance and Sexual Activity  . Alcohol use: Yes    Comment: occ  . Drug use: No  . Sexual activity: Not Currently    Birth control/protection: Surgical    Comment: hyst  Other Topics Concern  . Not on file  Social History Narrative   Lives in [place]   Engaged/boyfriend (father to youngest chid)   4 children: daughter (60  as of 2013), sons (15, 81, 74 as of 2013)   Religion: christian   Social Determinants of Radio broadcast assistant Strain:   . Difficulty of Paying Living Expenses:   Food Insecurity:   . Worried About Charity fundraiser in the Last Year:   . Arboriculturist in the Last Year:   Transportation Needs:   . Film/video editor (Medical):   Marland Kitchen Lack of Transportation (Non-Medical):   Physical Activity:   . Days of Exercise per Week:   . Minutes of Exercise per Session:   Stress:   . Feeling of Stress :   Social Connections:   . Frequency of Communication with Friends and Family:   . Frequency of Social Gatherings with Friends and Family:   . Attends Religious Services:   . Active Member of Clubs or Organizations:   . Attends Archivist Meetings:   Marland Kitchen Marital Status:      Family History:  The patient's ***family history includes ADD / ADHD in her son; Diabetes (age of onset: 62) in her mother; Heart attack in her brother; Heart disease in her father and mother; Heart disease (age of onset: 108) in her maternal grandmother; Hyperlipidemia in her paternal grandfather; Hypertension in her father, maternal uncle, and paternal grandfather.   Review of Systems:   Please see the history of present illness.     General:  No chills, fever, night sweats or weight changes.  Cardiovascular:  No chest pain, dyspnea on exertion, edema, orthopnea, palpitations, paroxysmal nocturnal dyspnea. Dermatological: No rash, lesions/masses Respiratory: No cough, dyspnea Urologic: No hematuria, dysuria Abdominal:   No nausea, vomiting, diarrhea, bright red blood per rectum, melena, or hematemesis Neurologic:  No visual changes, wkns, changes in mental status. All other systems reviewed and are otherwise negative except as noted above.   Physical Exam:    VS:  LMP 08/21/2019    General: Well developed, well nourished,female appearing in no acute distress. Head: Normocephalic, atraumatic,  sclera non-icteric.  Neck: No carotid bruits. JVD not elevated.  Lungs: Respirations regular and  unlabored, without wheezes or rales.  Heart: ***Regular rate and rhythm. No S3 or S4.  No murmur, no rubs, or gallops appreciated. Abdomen: Soft, non-tender, non-distended. No obvious abdominal masses. Msk:  Strength and tone appear normal for age. No obvious joint deformities or effusions. Extremities: No clubbing or cyanosis. No edema.  Distal pedal pulses are 2+ bilaterally. Neuro: Alert and oriented X 3. Moves all extremities spontaneously. No focal deficits noted. Psych:  Responds to questions appropriately with a normal affect. Skin: No rashes or lesions noted  Wt Readings from Last 3 Encounters:  11/06/19 225 lb (102.1 kg)  11/02/19 230 lb (104.3 kg)  09/26/19 240 lb 8.4 oz (109.1 kg)        Studies/Labs Reviewed:   EKG:  EKG is*** ordered today.  The ekg ordered today demonstrates ***  Recent Labs: 05/05/2019: B Natriuretic Peptide 1,083.0 06/05/2019: TSH 0.719 09/24/2019: ALT 21 09/27/2019: BUN 12; Creatinine, Ser 0.84; Hemoglobin 10.3; Magnesium 2.1; Platelets 209; Potassium 3.7; Sodium 136   Lipid Panel    Component Value Date/Time   CHOL 211 (H) 06/03/2018 0615   TRIG 223 (H) 06/03/2018 0615   HDL 39 (L) 06/03/2018 0615   CHOLHDL 5.4 06/03/2018 0615   VLDL 45 (H) 06/03/2018 0615   LDLCALC 127 (H) 06/03/2018 0615    Additional studies/ records that were reviewed today include:   Cardiac Catheterization: 12/2015  The left ventricular systolic function is normal.  LV end diastolic pressure is mildly elevated.  The left ventricular ejection fraction is 50-55% by visual estimate.   1. No significant CAD 2. Good LV function 3. Mildly elevated LV EDP.  Plan: medical therapy   Echocardiogram: 10/2018 IMPRESSIONS    1. The left ventricle has a visually estimated ejection fraction of 45%.  The cavity size was normal. There is severe concentric left  ventricular  hypertrophy. Left ventricular diastolic Doppler parameters are consistent  with pseudonormalization. Elevated  left ventricular end-diastolic pressure Left ventricular diffuse  hypokinesis.  2. The right ventricle has normal systolic function. The cavity was  normal. There is no increase in right ventricular wall thickness.  3. Left atrial size was severely dilated.  4. The mitral valve is grossly normal.  5. The tricuspid valve is grossly normal.  6. The aortic valve is tricuspid. Mild thickening of the aortic valve.  7. Moderate aortic annular calcification.  8. The aortic root is normal in size and structure.   Assessment:    No diagnosis found.   Plan:   In order of problems listed above:  1. ***    Medication Adjustments/Labs and Tests Ordered: Current medicines are reviewed at length with the patient today.  Concerns regarding medicines are outlined above.  Medication changes, Labs and Tests ordered today are listed in the Patient Instructions below. There are no Patient Instructions on file for this visit.   Signed, Erma Heritage, PA-C  11/09/2019 11:56 AM    [facility] [address] [place], [place] [address] Phone: [phone] Fax: [phone]

## 2019-11-26 ENCOUNTER — Telehealth: Payer: Self-pay | Admitting: Obstetrics & Gynecology

## 2019-11-26 NOTE — Telephone Encounter (Signed)
Telephoned patient at home number. Patient states having leg pain, urinary pressure, and burning with urination. Advised patient could do nurse visit to check for UTI. Patient refused would rather see provider. Patient was scheduled for an appointment.

## 2019-11-26 NOTE — Telephone Encounter (Signed)
Patient called in because her leg is tingling on the left side that she sis not have before her surgery (hysterectomy), and is having lower back since the surgery.  Patient also stated she is not sure if she has UTI because when she uses the bathroom she feels pressure, and that she just has not been right since she had that surgery and wants someone to call her.

## 2019-12-03 ENCOUNTER — Ambulatory Visit: Payer: Medicaid Other | Admitting: Obstetrics and Gynecology

## 2019-12-10 ENCOUNTER — Ambulatory Visit: Payer: Medicaid Other | Admitting: Obstetrics & Gynecology

## 2019-12-18 ENCOUNTER — Ambulatory Visit: Payer: Medicaid Other | Admitting: Adult Health

## 2019-12-19 ENCOUNTER — Other Ambulatory Visit (HOSPITAL_COMMUNITY)
Admission: RE | Admit: 2019-12-19 | Discharge: 2019-12-19 | Disposition: A | Discharge status: Home / Self Care | Payer: Medicaid Other | Source: Ambulatory Visit | Attending: Adult Health | Admitting: Adult Health

## 2019-12-19 ENCOUNTER — Ambulatory Visit (INDEPENDENT_AMBULATORY_CARE_PROVIDER_SITE_OTHER): Payer: Medicaid Other | Admitting: Adult Health

## 2019-12-19 ENCOUNTER — Encounter: Payer: Self-pay | Admitting: Adult Health

## 2019-12-19 VITALS — BP 157/108 | HR 104 | Ht 66.0 in | Wt 222.0 lb

## 2019-12-19 DIAGNOSIS — N898 Other specified noninflammatory disorders of vagina: Secondary | ICD-10-CM | POA: Diagnosis not present

## 2019-12-19 DIAGNOSIS — M549 Dorsalgia, unspecified: Secondary | ICD-10-CM | POA: Diagnosis not present

## 2019-12-19 DIAGNOSIS — B9689 Other specified bacterial agents as the cause of diseases classified elsewhere: Secondary | ICD-10-CM

## 2019-12-19 DIAGNOSIS — Z90710 Acquired absence of both cervix and uterus: Secondary | ICD-10-CM

## 2019-12-19 DIAGNOSIS — R1032 Left lower quadrant pain: Secondary | ICD-10-CM

## 2019-12-19 DIAGNOSIS — N760 Acute vaginitis: Secondary | ICD-10-CM

## 2019-12-19 DIAGNOSIS — I10 Essential (primary) hypertension: Secondary | ICD-10-CM | POA: Insufficient documentation

## 2019-12-19 LAB — POCT URINALYSIS DIPSTICK
Blood, UA: NEGATIVE
Glucose, UA: NEGATIVE
Ketones, UA: NEGATIVE
Leukocytes, UA: NEGATIVE
Nitrite, UA: NEGATIVE
Protein, UA: NEGATIVE

## 2019-12-19 LAB — POCT WET PREP (WET MOUNT)
Clue Cells Wet Prep Whiff POC: NEGATIVE
WBC, Wet Prep HPF POC: POSITIVE

## 2019-12-19 MED ORDER — TRIAMCINOLONE ACETONIDE 0.5 % EX OINT
1.0000 | TOPICAL_OINTMENT | Freq: Two times a day (BID) | CUTANEOUS | 0 refills | Status: DC
Start: 2019-12-19 — End: 2020-09-13

## 2019-12-19 MED ORDER — FLUCONAZOLE 150 MG PO TABS
ORAL_TABLET | ORAL | 1 refills | Status: DC
Start: 2019-12-19 — End: 2020-03-30

## 2019-12-19 MED ORDER — METRONIDAZOLE 500 MG PO TABS
500.0000 mg | ORAL_TABLET | Freq: Two times a day (BID) | ORAL | 0 refills | Status: DC
Start: 2019-12-19 — End: 2020-03-30

## 2019-12-19 NOTE — Progress Notes (Signed)
Subjective:     Patient ID: Chelsey Santiago, female   DOB: Feb 13, 1976, 44 y.o.   MRN: [id number]  HPI Chelsey Santiago is a 44 year old black female, married, sp hysterectomy in May 2021,in complaining of pain LLQ and back since hysterectomy, pain with sex at times, vaginal discharge, with odor at times and vaginal itching, PCP is Dr Legrand Rams    Review of Systems Has LLQ pain and low back pain since hysterectomy Has pain with sex at times +vaginal discharge Has vaginal odor at times +vaginal itching Reviewed past medical,surgical, social and family history. Reviewed medications and allergies.     Objective:   Physical Exam BP (!) 157/108 (BP Location: Left Arm, Patient Position: Sitting, Cuff Size: Normal)   Pulse (!) 104   Ht 5\' 6"  (1.676 m)   Wt 222 lb (100.7 kg)   LMP 08/21/2019   BMI 35.83 kg/m  she says this BP is good for her urine dipstick is negative  Skin warm and dry.Pelvic: external genitalia is normal in appearance no lesions,but is red on inner labia and slightly swollen, vagina: white discharge without odor,urethra has no lesions or masses noted, cervix and uterus are absent,adnexa: no masses or tenderness noted. Bladder is non tender and no masses felt. Has low back with bending Wet prep: + for clue cells and +WBCs. CV swab obtained  [facility] - 12/19/19 1015      Pregnancy Intention Screening   Does the patient want to become pregnant in the next year? N/A    Does the patient's partner want to become pregnant in the next year? N/A    Would the patient like to discuss contraceptive options today? N/A      Contraception Wrap Up   Current Method No Method - Other Reason    End Method No Method - Other Reason    Contraception Counseling Provided No         Examination chaperoned by Diona Fanti CMA.    Assessment:     1. Back pain, unspecified back location, unspecified back pain laterality, unspecified chronicity IF [place] normal may need spinal xray and see PCP  2. LLQ  pain Will get GYN [place] to assess ovaries in about a week   3. S/P hysterectomy  4. Vaginal discharge CV swab sent  5. Vaginal itching Will rx diflucan and can use kenalog  on outside tissues   6. BV (bacterial vaginosis) Will rx flagyl Meds ordered this encounter  Medications  . metroNIDAZOLE (FLAGYL) 500 MG tablet    Sig: Take 1 tablet (500 mg total) by mouth 2 (two) times daily.    Dispense:  14 tablet    Refill:  0    Order Specific Question:   Supervising Provider    Answer:   Elonda Husky, LUTHER H [[id number]]  . fluconazole (DIFLUCAN) 150 MG tablet    Sig: Take 1 now and 1 in 3 days    Dispense:  2 tablet    Refill:  1    Order Specific Question:   Supervising Provider    Answer:   EURE, LUTHER H [[id number]]  . triamcinolone ointment (KENALOG) 0.5 %    Sig: Apply 1 application topically 2 (two) times daily.    Dispense:  30 g    Refill:  0    Order Specific Question:   Supervising Provider    Answer:   Elonda Husky, LUTHER H [[id number]]    7. Chronic hypertension Take meds and follow up with Dr Legrand Rams or cardiologist  Plan:     Follow up in about a week for GYN [place]

## 2019-12-20 LAB — CERVICOVAGINAL ANCILLARY ONLY
Bacterial Vaginitis (gardnerella): POSITIVE — AB
Candida Glabrata: NEGATIVE
Candida Vaginitis: POSITIVE — AB
Chlamydia: NEGATIVE
Comment: NEGATIVE
Comment: NEGATIVE
Comment: NEGATIVE
Comment: NEGATIVE
Comment: NEGATIVE
Comment: NORMAL
Neisseria Gonorrhea: NEGATIVE
Trichomonas: NEGATIVE

## 2019-12-27 ENCOUNTER — Other Ambulatory Visit: Payer: Medicaid Other

## 2020-01-14 ENCOUNTER — Other Ambulatory Visit: Payer: Self-pay | Admitting: Cardiology

## 2020-01-23 ENCOUNTER — Other Ambulatory Visit: Payer: Self-pay | Admitting: Cardiology

## 2020-01-23 MED ORDER — HYDRALAZINE HCL 100 MG PO TABS: 100.0000 mg | ORAL_TABLET | Freq: Three times a day (TID) | ORAL | 1 refills | Status: DC

## 2020-01-23 MED ORDER — CARVEDILOL 25 MG PO TABS: 50.0000 mg | ORAL_TABLET | Freq: Two times a day (BID) | ORAL | 3 refills | Status: DC

## 2020-01-23 NOTE — Telephone Encounter (Signed)
New message      *STAT* If patient is at the pharmacy, call can be transferred to refill team.   1. Which medications need to be refilled? (please list name of each medication and dose if known) carvedilol (COREG) 25 MG tablet hydrALAZINE (APRESOLINE) 100 MG tablet   2. Which pharmacy/location (including street and city if local pharmacy) is medication to be sent to? [facility]   3. Do they need a 30 day or 90 day supply? [place]

## 2020-01-23 NOTE — Telephone Encounter (Signed)
refilled hydralazine and coreg

## 2020-02-01 ENCOUNTER — Other Ambulatory Visit: Payer: Medicaid Other

## 2020-02-01 ENCOUNTER — Other Ambulatory Visit: Payer: Self-pay

## 2020-02-01 DIAGNOSIS — Z20822 Contact with and (suspected) exposure to covid-19: Secondary | ICD-10-CM

## 2020-02-04 LAB — NOVEL CORONAVIRUS, NAA: SARS-CoV-2, NAA: NOT DETECTED

## 2020-02-06 ENCOUNTER — Other Ambulatory Visit: Payer: Self-pay

## 2020-02-06 ENCOUNTER — Encounter (HOSPITAL_COMMUNITY): Payer: Self-pay | Admitting: *Deleted

## 2020-02-06 ENCOUNTER — Emergency Department (HOSPITAL_COMMUNITY): Payer: Medicaid Other

## 2020-02-06 ENCOUNTER — Emergency Department (HOSPITAL_COMMUNITY)
Admission: EM | Admit: 2020-02-06 | Discharge: 2020-02-06 | Disposition: A | Discharge status: Home / Self Care | Payer: Medicaid Other | Attending: Emergency Medicine | Admitting: Emergency Medicine

## 2020-02-06 DIAGNOSIS — R059 Cough, unspecified: Secondary | ICD-10-CM

## 2020-02-06 DIAGNOSIS — I5043 Acute on chronic combined systolic (congestive) and diastolic (congestive) heart failure: Secondary | ICD-10-CM | POA: Diagnosis not present

## 2020-02-06 DIAGNOSIS — M546 Pain in thoracic spine: Secondary | ICD-10-CM | POA: Insufficient documentation

## 2020-02-06 DIAGNOSIS — I110 Hypertensive heart disease with heart failure: Secondary | ICD-10-CM | POA: Insufficient documentation

## 2020-02-06 DIAGNOSIS — R05 Cough: Secondary | ICD-10-CM | POA: Diagnosis present

## 2020-02-06 DIAGNOSIS — Z794 Long term (current) use of insulin: Secondary | ICD-10-CM | POA: Diagnosis not present

## 2020-02-06 DIAGNOSIS — Z7982 Long term (current) use of aspirin: Secondary | ICD-10-CM | POA: Diagnosis not present

## 2020-02-06 DIAGNOSIS — Z79899 Other long term (current) drug therapy: Secondary | ICD-10-CM | POA: Diagnosis not present

## 2020-02-06 DIAGNOSIS — E876 Hypokalemia: Secondary | ICD-10-CM

## 2020-02-06 DIAGNOSIS — E1165 Type 2 diabetes mellitus with hyperglycemia: Secondary | ICD-10-CM | POA: Diagnosis not present

## 2020-02-06 DIAGNOSIS — I214 Non-ST elevation (NSTEMI) myocardial infarction: Secondary | ICD-10-CM | POA: Diagnosis not present

## 2020-02-06 DIAGNOSIS — Z20822 Contact with and (suspected) exposure to covid-19: Secondary | ICD-10-CM | POA: Insufficient documentation

## 2020-02-06 DIAGNOSIS — I10 Essential (primary) hypertension: Secondary | ICD-10-CM

## 2020-02-06 DIAGNOSIS — E1159 Type 2 diabetes mellitus with other circulatory complications: Secondary | ICD-10-CM | POA: Insufficient documentation

## 2020-02-06 LAB — BASIC METABOLIC PANEL
Anion gap: 12 (ref 5–15)
BUN: 19 mg/dL (ref 6–20)
CO2: 26 mmol/L (ref 22–32)
Calcium: 9.1 mg/dL (ref 8.9–10.3)
Chloride: 93 mmol/L — ABNORMAL LOW (ref 98–111)
Creatinine, Ser: 0.93 mg/dL (ref 0.44–1.00)
GFR calc Af Amer: >60 mL/min (ref >60)
GFR calc non Af Amer: >60 mL/min (ref >60)
Glucose, Bld: 237 mg/dL — ABNORMAL HIGH (ref 70–99)
Potassium: 3 mmol/L — ABNORMAL LOW (ref 3.5–5.1)
Sodium: 131 mmol/L — ABNORMAL LOW (ref 135–145)

## 2020-02-06 LAB — BRAIN NATRIURETIC PEPTIDE: B Natriuretic Peptide: 261 pg/mL — ABNORMAL HIGH (ref 0.0–100.0)

## 2020-02-06 LAB — CBC WITH DIFFERENTIAL/PLATELET
Abs Immature Granulocytes: 0.06 10*3/uL (ref 0.00–0.07)
Basophils Absolute: 0.1 10*3/uL (ref 0.0–0.1)
Basophils Relative: 1 %
Eosinophils Absolute: 0.2 10*3/uL (ref 0.0–0.5)
Eosinophils Relative: 2 %
HCT: 38.9 % (ref 36.0–46.0)
Hemoglobin: 12 g/dL (ref 12.0–15.0)
Immature Granulocytes: 1 %
Lymphocytes Relative: 21 %
Lymphs Abs: 2.3 10*3/uL (ref 0.7–4.0)
MCH: 23 pg — ABNORMAL LOW (ref 26.0–34.0)
MCHC: 30.8 g/dL (ref 30.0–36.0)
MCV: 74.7 fL — ABNORMAL LOW (ref 80.0–100.0)
Monocytes Absolute: 0.7 10*3/uL (ref 0.1–1.0)
Monocytes Relative: 7 %
Neutro Abs: 7.8 10*3/uL — ABNORMAL HIGH (ref 1.7–7.7)
Neutrophils Relative %: 68 %
Platelets: 291 10*3/uL (ref 150–400)
RBC: 5.21 MIL/uL — ABNORMAL HIGH (ref 3.87–5.11)
RDW: 15.6 % — ABNORMAL HIGH (ref 11.5–15.5)
WBC: 11.1 10*3/uL — ABNORMAL HIGH (ref 4.0–10.5)
nRBC: 0 % (ref 0.0–0.2)

## 2020-02-06 LAB — SARS CORONAVIRUS 2 BY RT PCR (HOSPITAL ORDER, PERFORMED IN ~~LOC~~ HOSPITAL LAB): SARS Coronavirus 2: NEGATIVE

## 2020-02-06 MED ORDER — PROMETHAZINE-CODEINE 6.25-10 MG/5ML PO SYRP: 5.0000 mL | ORAL_SOLUTION | ORAL | 0 refills | Status: DC | PRN

## 2020-02-06 MED ORDER — GUAIFENESIN-DM 100-10 MG/5ML PO SYRP
5.0000 mL | ORAL_SOLUTION | Freq: Once | ORAL | Status: AC
  Administered 2020-02-06: 5 mL via ORAL
  Filled 2020-02-06: qty 5

## 2020-02-06 MED ORDER — LABETALOL HCL 5 MG/ML IV SOLN
20.0000 mg | Freq: Once | INTRAVENOUS | Status: AC
  Administered 2020-02-06: 20 mg via INTRAVENOUS
  Filled 2020-02-06: qty 4

## 2020-02-06 MED ORDER — HYDRALAZINE HCL 25 MG PO TABS
100.0000 mg | ORAL_TABLET | Freq: Once | ORAL | Status: AC
  Administered 2020-02-06: 100 mg via ORAL
  Filled 2020-02-06: qty 4

## 2020-02-06 MED ORDER — PROMETHAZINE-CODEINE 6.25-10 MG/5ML PO SYRP: 5.0000 mL | ORAL_SOLUTION | Freq: Once | ORAL | Status: DC

## 2020-02-06 NOTE — Discharge Instructions (Signed)
Take the medication prescribed for your cough, this may make you drowsy so use caution.  Do not drive within 4 hours of taking this cough syrup.  Your potassium is low today so make sure you are taking your potassium supplementation.  Take 2 tablets daily without missing doses.  Plan to see your primary physician for recheck of your symptoms if not improving over the next week.

## 2020-02-06 NOTE — ED Provider Notes (Signed)
[facility] EMERGENCY DEPARTMENT Provider Note   CSN: [id number] Arrival date & time: 02/06/20  1537     History Chief Complaint  Patient presents with  . Cough    Chelsey Santiago is a 44 y.o. female with a history significant for CHF, diabetes, hypertension and history of pulmonary edema presenting with a 5-day history of cough which is sometimes productive of clear due to, along with focal right mid back pain which is triggered by coughing.  She denies chest pain, no sob, also denies fevers or chills, nasal congestion or drainage, also no nausea or vomiting, no abdominal pain.  She has children in school and has been concerned about possible COVID-19 virus.  She was screened 5 days ago and her test was negative but her symptoms have worsened since then.  She denies shortness of breath but states her cough is worse when she lies supine, and has been coughing on pillows to sleep.  However, she typically has peripheral edema when her CHF is worsened and she denies this today.  She has had no medications for symptom relief.  Her blood pressure is very elevated today 182/115.  She states she has not taken her afternoon medications including her hydralazine.  She denies headache, chest pain.  HPI     Past Medical History:  Diagnosis Date  . Anemia    H&H of 10.6/33 and 07/2008 and 11.9/35 and 09/2010  . Anxiety   . Chronic combined systolic and diastolic CHF (congestive heart failure) ([place])   . Depression with anxiety   . Diabetes mellitus without complication ([place])   . Hypertension   . Hypertensive heart disease 2009   Pulmonary edema postpartum; mild to moderate mitral regurgitation when hospitalized for CHF in 2009; Echocardiogram in 12/2009-no MR and normal EF; normal CXR in 09/2010  . Migraine headache   . Miscarriage 03/19/2013  . Obesity 04/16/2009  . Osteoarthritis, knee 03/29/2011  . Preeclampsia   . Pulmonary edema   . Sleep apnea   . Threatened abortion in early pregnancy  03/15/2013    Patient Active Problem List   Diagnosis Date Noted  . Back pain 12/19/2019  . Vaginal discharge 12/19/2019  . Vaginal itching 12/19/2019  . BV (bacterial vaginosis) 12/19/2019  . Chronic hypertension 12/19/2019  . Fibroids 09/26/2019  . S/P hysterectomy 09/26/2019  . Acute blood loss anemia 09/26/2019  . Abdominal pain, epigastric 03/14/2019  . Dysphagia 03/14/2019  . Gastroesophageal reflux disease 03/14/2019  . Class 2 obesity   . Acute exacerbation of CHF (congestive heart failure) ([place]) 10/26/2018  . Chronic combined systolic and diastolic CHF (congestive heart failure) ([place]) 06/02/2018  . Uncontrolled type 2 diabetes mellitus with hyperglycemia ([place]) 06/02/2018  . Influenza B 05/13/2018  . Hypomagnesemia 05/12/2018  . Headache 05/12/2018  . Upper respiratory tract infection   . HCAP (healthcare-associated pneumonia)   . Constipation 10/17/2017  . Bad headache   . CHF exacerbation ([place]) 09/29/2017  . Iron deficiency anemia 05/16/2017  . Vitamin D deficiency 11/25/2016  . Iron deficiency 11/25/2016  . History of acute myocardial infarction 10/05/2016  . CHF (congestive heart failure) ([place]) 05/06/2016  . Diabetes mellitus with complication ([place]) 36/62/9476  . Leukocytosis 05/06/2016  . Neuropathy 05/06/2016  . Depression 04/16/2016  . Chronic tension-type headache, not intractable 04/16/2016  . AKI (acute kidney injury) ([place])   . Hyperkalemia   . Nonischemic cardiomyopathy ([place])   . Acute on chronic combined systolic and diastolic ACC/AHA stage C congestive heart failure ([place]) 04/03/2016  .  Acute on chronic combined systolic and diastolic CHF, NYHA class 4 ([place]) 04/03/2016  . Hypertensive emergency 04/03/2016  . Cardiomyopathy due to hypertension ([place]) 12/22/2015  . Normal coronary arteries 12/22/2015  . Troponin level elevated 12/22/2015  . NSTEMI (non-ST elevated myocardial infarction) ([place]) 12/20/2015  . Dental infection 10/10/2015  . Chest pain  09/05/2015  . Systolic CHF, chronic ([place]) 09/05/2015  . LLQ pain   . Type 2 diabetes mellitus without complication ([place]) 98/92/1194  . Essential hypertension   . 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 Hypertensive urgency 04/22/2014  . Acute CHF ([place]) 04/22/2014  . S/P cesarean section 04/11/2014  . Acute pulmonary edema ([place]) 04/11/2014  . Postoperative anemia 04/11/2014  . Elevated serum creatinine 04/11/2014  . Preeclampsia, severe 04/09/2014  . Pre-eclampsia superimposed on chronic hypertension, antepartum 04/08/2014  . Dyspnea 04/08/2014  . Polyhydramnios in third trimester, antepartum 03/14/2014  . Abnormal maternal glucose tolerance, antepartum 03/11/2014  . High-risk pregnancy 03/11/2014  . Pre-existing essential hypertension complicating pregnancy 17/40/8144  . Impaired glucose tolerance during pregnancy, antepartum 11/27/2013  . Leiomyoma of uterus 11/22/2013  . History of gestational diabetes in prior pregnancy, currently pregnant in first trimester 11/22/2013  . Hx of preeclampsia, prior pregnancy, currently pregnant 11/22/2013  . Short interval between pregnancies affecting pregnancy, antepartum 11/22/2013  . Supervision of high-risk pregnancy of elderly primigravida (>= 8 years old at delivery), third trimester 11/22/2013  . Miscarriage 03/19/2013  . Major depressive disorder, single episode, unspecified 09/27/2011  . Hypertension   . Hypertensive cardiovascular disease   . Microcytic anemia   . Osteoarthrosis involving lower leg 03/29/2011  . Hypokalemia 12/12/2009  . OSA on CPAP 12/09/2009  . Morbid obesity ([place]) 04/16/2009    Past Surgical History:  Procedure Laterality Date  . BREAST REDUCTION SURGERY  2002  . CARDIAC CATHETERIZATION N/A 12/22/2015   Procedure: Left Heart Cath and Coronary Angiography;  Surgeon: Peter M Martinique, MD;  Location: [facility];  Service: Cardiovascular;  Laterality: N/A;  . CESAREAN SECTION N/A 04/09/2014   Procedure:  CESAREAN SECTION;  Surgeon: Mora Bellman, MD;  Location: [facility];  Service: Obstetrics;  Laterality: N/A;  . CHOLECYSTECTOMY    . HYSTERECTOMY ABDOMINAL WITH SALPINGECTOMY N/A 09/26/2019   Procedure: HYSTERECTOMY ABDOMINAL WITH SALPINGECTOMY;  Surgeon: Florian Buff, MD;  Location: [facility];  Service: Gynecology;  Laterality: N/A;  . LIPOMA EXCISION Right 09/26/2019   Procedure: EXCISION LIPOMA RIGHT VULVAR;  Surgeon: Florian Buff, MD;  Location: [facility];  Service: Gynecology;  Laterality: Right;     OB History    Gravida  11   Para  6   Term  5   Preterm  1   AB  5   Living  6     SAB  3   TAB  2   Ectopic      Multiple  0   Live Births  6           Family History  Problem Relation Age of Onset  . Diabetes Mother 72  . Heart disease Mother   . Hyperlipidemia Paternal Grandfather   . Hypertension Paternal Grandfather   . Heart disease Father   . Hypertension Father   . Heart disease Maternal Grandmother 60  . ADD / ADHD Son   . Hypertension Maternal Uncle   . Heart attack Brother   . Sudden death Neg Hx   . Colon cancer Neg Hx   . Celiac disease Neg Hx   .  Inflammatory bowel disease Neg Hx     Social History   Tobacco Use  . Smoking status: Never Smoker  . Smokeless tobacco: Never Used  Vaping Use  . Vaping Use: Never used  Substance Use Topics  . Alcohol use: Yes    Comment: occ  . Drug use: No    Home Medications Prior to Admission medications   Medication Sig Start Date End Date Taking? Authorizing Provider  albuterol (PROVENTIL HFA;VENTOLIN HFA) 108 (90 Base) MCG/ACT inhaler Inhale 1 puff into the lungs every 6 (six) hours as needed for wheezing or shortness of breath.    [provider]  amLODipine (NORVASC) 10 MG tablet Take 1 tablet (10 mg total) by mouth daily. 09/04/19 12/19/19  Strader, Fransisco Hertz, PA-C  ARIPiprazole (ABILIFY) 10 MG tablet Take 1 tablet (10 mg total) by mouth daily. 10/30/18   Barton Dubois, MD  aspirin EC 81 MG  tablet Take 81 mg by mouth daily.    [provider]  budesonide-formoterol (SYMBICORT) 80-4.5 MCG/ACT inhaler Inhale 2 puffs into the lungs 2 (two) times daily. 05/04/18   Manuella Ghazi, Pratik D, DO  carvedilol (COREG) 25 MG tablet Take 2 tablets (50 mg total) by mouth 2 (two) times daily with a meal. 01/23/20   Branch, Alphonse Guild, MD  clonazePAM (KLONOPIN) 0.5 MG tablet Take 0.5 mg by mouth 2 (two) times daily. 12/19/18   [provider]  fluconazole (DIFLUCAN) 150 MG tablet Take 1 now and 1 in 3 days 12/19/19   Derrek Monaco A, NP  gabapentin (NEURONTIN) 600 MG tablet Take 600 mg by mouth 3 (three) times daily.     [provider]  hydrALAZINE (APRESOLINE) 100 MG tablet Take 1 tablet (100 mg total) by mouth 3 (three) times daily. 01/23/20   Arnoldo Lenis, MD  ibuprofen (ADVIL) 800 MG tablet Take 1 tablet (800 mg total) by mouth every 6 (six) hours. 09/28/19   Florian Buff, MD  insulin glargine (LANTUS) 100 UNIT/ML injection Inject 30 Units at bedtime into the skin.     [provider]  insulin lispro (HUMALOG) 100 UNIT/ML injection Inject 15 Units into the skin 3 (three) times daily before meals.     [provider]  Ipratropium-Albuterol (COMBIVENT RESPIMAT) 20-100 MCG/ACT AERS respimat Inhale 1 puff into the lungs every 6 (six) hours as needed for wheezing or shortness of breath. 05/04/18   Manuella Ghazi, Pratik D, DO  linaclotide Rolan Lipa) 145 MCG CAPS capsule Take 1 capsule (145 mcg total) by mouth daily before breakfast. 03/14/19   Erenest Rasher, PA-C  metroNIDAZOLE (FLAGYL) 500 MG tablet Take 1 tablet (500 mg total) by mouth 2 (two) times daily. 12/19/19   Estill Dooms, NP  nitroGLYCERIN (NITROSTAT) 0.4 MG SL tablet Place 1 tablet (0.4 mg total) under the tongue every 5 (five) minutes as needed for chest pain. 09/05/15   Kathie Dike, MD  ondansetron (ZOFRAN ODT) 8 MG disintegrating tablet Take 1 tablet (8 mg total) by mouth every 8 (eight) hours as  needed for nausea or vomiting. 09/06/19   Long, Wonda Olds, MD  potassium chloride SA (KLOR-CON M20) 20 MEQ tablet Take 2 tablets (40 mEq total) by mouth daily. 09/04/19 12/19/19  Strader, Fransisco Hertz, PA-C  pyridostigmine (MESTINON) 60 MG tablet Take 60 mg by mouth every 8 (eight) hours.    [provider]  sacubitril-valsartan (ENTRESTO) 97-103 MG Take 1 tablet by mouth 2 (two) times daily. 03/16/19   Arnoldo Lenis, MD  torsemide (DEMADEX) 20 MG tablet Take 2 tablets (40 mg total) by mouth daily. 09/04/19 12/19/19  Strader, Fransisco Hertz, PA-C  traZODone (DESYREL) 100 MG tablet Take 2 tablets (200 mg total) by mouth at bedtime. 10/29/18   Barton Dubois, MD  triamcinolone ointment (KENALOG) 0.5 % Apply 1 application topically 2 (two) times daily. 12/19/19   Estill Dooms, NP    Allergies    Diclofenac, Tramadol, and Vicodin [hydrocodone-acetaminophen]  Review of Systems   Review of Systems  Constitutional: Negative for chills and fever.  HENT: Negative for congestion, rhinorrhea and sore throat.   Eyes: Negative.   Respiratory: Positive for cough. Negative for chest tightness and shortness of breath.   Cardiovascular: Negative for chest pain, palpitations and leg swelling.  Gastrointestinal: Negative for abdominal pain, nausea and vomiting.  Genitourinary: Negative.   Musculoskeletal: Positive for back pain. Negative for arthralgias, joint swelling and neck pain.  Skin: Negative.  Negative for rash and wound.  Neurological: Negative for dizziness, weakness, light-headedness, numbness and headaches.  Psychiatric/Behavioral: Negative.     Physical Exam Updated Vital Signs BP (!) 161/89 (BP Location: Left Arm)   Pulse 87   Temp 98 F (36.7 C) (Oral)   Resp 18   LMP 08/21/2019   SpO2 100%   Physical Exam Vitals and nursing note reviewed.  Constitutional:      General: She is not in acute distress.    Appearance: She is well-developed.  HENT:     Head: Normocephalic and  atraumatic.     Nose: No congestion.     Mouth/Throat:     Mouth: Mucous membranes are moist.  Eyes:     Conjunctiva/sclera: Conjunctivae normal.  Cardiovascular:     Rate and Rhythm: Normal rate and regular rhythm.     Heart sounds: Normal heart sounds. No murmur heard.   Pulmonary:     Effort: Pulmonary effort is normal.     Breath sounds: Normal breath sounds. No wheezing.  Abdominal:     General: Bowel sounds are normal.     Palpations: Abdomen is soft.     Tenderness: There is no abdominal tenderness.  Musculoskeletal:        General: Normal range of motion.     Cervical back: Normal range of motion.     Thoracic back: Tenderness present. No swelling or deformity.       Back:     Right lower leg: No edema.     Left lower leg: No edema.     Comments: Soreness to palpation right thoracic back beneath scapula.   Skin:    General: Skin is warm and dry.  Neurological:     Mental Status: She is alert.     ED Results / Procedures / Treatments   Labs (all labs ordered are listed, but only abnormal results are displayed) Labs Reviewed  CBC WITH DIFFERENTIAL/PLATELET - Abnormal; Notable for the following components:      Result Value   WBC 11.1 (*)    RBC 5.21 (*)    MCV 74.7 (*)    MCH 23.0 (*)    RDW 15.6 (*)    Neutro Abs 7.8 (*)    All other components within normal limits  BASIC METABOLIC PANEL - Abnormal; Notable for the following components:   Sodium 131 (*)    Potassium 3.0 (*)    Chloride 93 (*)    Glucose, Bld 237 (*)    All other components within normal limits  BRAIN  NATRIURETIC PEPTIDE - Abnormal; Notable for the following components:   B Natriuretic Peptide 261.0 (*)    All other components within normal limits  SARS CORONAVIRUS 2 BY RT PCR [facility] ORDER, [place] LAB)    EKG EKG Interpretation  Date/Time:  Wednesday February 06 2020 17:44:52 EDT Ventricular Rate:  84 PR Interval:  160 QRS Duration: 102 QT  Interval:  428 QTC Calculation: 505 R Axis:   6 Text Interpretation: Normal sinus rhythm Minimal voltage criteria for LVH, may be normal variant ( Cornell product ) Nonspecific T wave abnormality Prolonged QT Abnormal ECG Confirmed by Milton Ferguson [phone]) on 02/06/2020 6:06:38 PM   Radiology DG Chest Portable 1 View  Result Date: 02/06/2020 CLINICAL DATA:  Cough. EXAM: PORTABLE CHEST 1 VIEW COMPARISON:  07/04/2019 FINDINGS: The heart size is enlarged. There is no focal infiltrate. No large pleural effusion. No pneumothorax. IMPRESSION: Cardiomegaly without evidence for an acute cardiopulmonary process. Electronically Signed   By: Constance Holster M.D.   On: 02/06/2020 17:54    Procedures Procedures (including critical care time)  Medications Ordered in ED Medications  hydrALAZINE (APRESOLINE) tablet 100 mg (100 mg Oral Given 02/06/20 1758)  labetalol (NORMODYNE) injection 20 mg (20 mg Intravenous Given 02/06/20 1906)  guaiFENesin-dextromethorphan (ROBITUSSIN DM) 100-10 MG/5ML syrup 5 mL (5 mLs Oral Given 02/06/20 1910)    ED Course  I have reviewed the triage vital signs and the nursing notes.  Pertinent labs & imaging results that were available during my care of the patient were reviewed by me and considered in my medical decision making (see chart for details).    MDM Rules/Calculators/A&P                          Pt with persistent cough and right posterior back pain triggered by cough, sore to palpation without palpable deformity, pain free at rest, doubt rib fx.  No sob, denies chest pain.   Pt perc negative, doubt PE.   BNP elevated at 261, but but actually improved historically. Favor viral cough.  She is Covid negative again today. Additionally she is hypokalemic today.  She is prescribed potassium , takes prn, advised to take 2 tablets daily as prescribed.  Hydralazine given as she missed her mid day dose, also added labetalol for improvement in bp.    Guaifenesin/dextromethorphan given, also may benefit from tessalon for her cough symptom. Dc home planned, bp improved.      Final Clinical Impression(s) / ED Diagnoses Final diagnoses:  Cough  Essential hypertension    Rx / DC Orders ED Discharge Orders    None       Landis Martins 02/06/20 Ermelinda Das, MD 02/07/20 1027

## 2020-02-06 NOTE — ED Triage Notes (Signed)
States she had a covid test 5 days ago, states he children go to school and feel she may have covid. C/o cough lasting for 5 days.

## 2020-03-12 ENCOUNTER — Emergency Department (HOSPITAL_COMMUNITY): Payer: Medicaid Other

## 2020-03-12 ENCOUNTER — Emergency Department (HOSPITAL_COMMUNITY)
Admission: EM | Admit: 2020-03-12 | Discharge: 2020-03-12 | Disposition: A | Discharge status: Home / Self Care | Payer: Medicaid Other | Attending: Emergency Medicine | Admitting: Emergency Medicine

## 2020-03-12 ENCOUNTER — Encounter (HOSPITAL_COMMUNITY): Payer: Self-pay

## 2020-03-12 ENCOUNTER — Other Ambulatory Visit: Payer: Self-pay

## 2020-03-12 DIAGNOSIS — E876 Hypokalemia: Secondary | ICD-10-CM | POA: Insufficient documentation

## 2020-03-12 DIAGNOSIS — Z7982 Long term (current) use of aspirin: Secondary | ICD-10-CM | POA: Diagnosis not present

## 2020-03-12 DIAGNOSIS — R519 Headache, unspecified: Secondary | ICD-10-CM | POA: Diagnosis not present

## 2020-03-12 DIAGNOSIS — I110 Hypertensive heart disease with heart failure: Secondary | ICD-10-CM | POA: Insufficient documentation

## 2020-03-12 DIAGNOSIS — R2240 Localized swelling, mass and lump, unspecified lower limb: Secondary | ICD-10-CM | POA: Diagnosis present

## 2020-03-12 DIAGNOSIS — E1165 Type 2 diabetes mellitus with hyperglycemia: Secondary | ICD-10-CM | POA: Insufficient documentation

## 2020-03-12 DIAGNOSIS — Z794 Long term (current) use of insulin: Secondary | ICD-10-CM | POA: Diagnosis not present

## 2020-03-12 DIAGNOSIS — Z79899 Other long term (current) drug therapy: Secondary | ICD-10-CM | POA: Diagnosis not present

## 2020-03-12 DIAGNOSIS — Z955 Presence of coronary angioplasty implant and graft: Secondary | ICD-10-CM | POA: Insufficient documentation

## 2020-03-12 DIAGNOSIS — I5043 Acute on chronic combined systolic (congestive) and diastolic (congestive) heart failure: Secondary | ICD-10-CM | POA: Diagnosis not present

## 2020-03-12 LAB — TROPONIN I (HIGH SENSITIVITY)
Troponin I (High Sensitivity): 22 ng/L — ABNORMAL HIGH (ref <18)
Troponin I (High Sensitivity): 21 ng/L — ABNORMAL HIGH (ref <18)

## 2020-03-12 LAB — BASIC METABOLIC PANEL
Anion gap: 10 (ref 5–15)
BUN: 14 mg/dL (ref 6–20)
CO2: 30 mmol/L (ref 22–32)
Calcium: 8.8 mg/dL — ABNORMAL LOW (ref 8.9–10.3)
Chloride: 92 mmol/L — ABNORMAL LOW (ref 98–111)
Creatinine, Ser: 0.98 mg/dL (ref 0.44–1.00)
GFR, Estimated: >60 mL/min (ref >60)
Glucose, Bld: 310 mg/dL — ABNORMAL HIGH (ref 70–99)
Potassium: 3.1 mmol/L — ABNORMAL LOW (ref 3.5–5.1)
Sodium: 132 mmol/L — ABNORMAL LOW (ref 135–145)

## 2020-03-12 LAB — CBC
HCT: 38.8 % (ref 36.0–46.0)
Hemoglobin: 12 g/dL (ref 12.0–15.0)
MCH: 23.5 pg — ABNORMAL LOW (ref 26.0–34.0)
MCHC: 30.9 g/dL (ref 30.0–36.0)
MCV: 75.9 fL — ABNORMAL LOW (ref 80.0–100.0)
Platelets: 259 10*3/uL (ref 150–400)
RBC: 5.11 MIL/uL (ref 3.87–5.11)
RDW: 15.2 % (ref 11.5–15.5)
WBC: 9.4 10*3/uL (ref 4.0–10.5)
nRBC: 0 % (ref 0.0–0.2)

## 2020-03-12 LAB — BRAIN NATRIURETIC PEPTIDE: B Natriuretic Peptide: 813 pg/mL — ABNORMAL HIGH (ref 0.0–100.0)

## 2020-03-12 MED ORDER — POTASSIUM CHLORIDE CRYS ER 20 MEQ PO TBCR: 20.0000 meq | EXTENDED_RELEASE_TABLET | Freq: Two times a day (BID) | ORAL | 0 refills | Status: DC

## 2020-03-12 MED ORDER — HYDRALAZINE HCL 25 MG PO TABS
100.0000 mg | ORAL_TABLET | Freq: Once | ORAL | Status: AC
  Administered 2020-03-12: 100 mg via ORAL
  Filled 2020-03-12: qty 4

## 2020-03-12 MED ORDER — POTASSIUM CHLORIDE CRYS ER 20 MEQ PO TBCR
40.0000 meq | EXTENDED_RELEASE_TABLET | Freq: Once | ORAL | Status: AC
  Administered 2020-03-12: 40 meq via ORAL
  Filled 2020-03-12: qty 2

## 2020-03-12 MED ORDER — FUROSEMIDE 10 MG/ML IJ SOLN
80.0000 mg | Freq: Once | INTRAMUSCULAR | Status: AC
  Administered 2020-03-12: 80 mg via INTRAVENOUS
  Filled 2020-03-12: qty 8

## 2020-03-12 NOTE — ED Triage Notes (Signed)
Pt reports that she had right shoulder soreness and tingling in left arm for 2 days. CP last night. Pt has CHF and has been increasing fluid meds. She reports she has had fluid in legs, feels weak and coughing up foamy mucous

## 2020-03-12 NOTE — Discharge Instructions (Signed)
As we discussed BNP suggest some increased congestive heart failure.  Increase your torsemide to 5 pills a day as they have done in the past.  Make an appointment follow-up with cardiology.  Your potassium was a little on the low side.  Given 40 mEq potassium here today.  We will have some potassium supplementation to take at home.  Make an appointment to follow-up with your regular doctor to have that rechecked.  Work note provided.  Return for any new or worse symptoms

## 2020-03-12 NOTE — ED Notes (Signed)
Nsg Discharge Note  Admit Date:  03/12/2020 Discharge date: 03/12/2020   Wyonia Fontanella to be D/C'd home per MD order.  AVS completed.  Copy for chart, and copy for patient signed, and dated. Patient/caregiver able to verbalize understanding.  Discharge Medication:   Discharge Assessment: Vitals:   03/12/20 1330 03/12/20 1400  BP: 114/78 112/61  Pulse: 78 83  Resp: (!) 21 (!) 26  Temp:    SpO2: 92% 98%   Skin clean, dry and intact without evidence of skin break down, no evidence of skin tears noted. IV catheter discontinued intact. Site without signs and symptoms of complications - no redness or edema noted at insertion site, patient denies c/o pain - only slight tenderness at site.  Dressing with slight pressure applied.  D/c Instructions-Education: Discharge instructions given to patient/family with verbalized understanding. D/c education completed with patient/family including follow up instructions, medication list, d/c activities limitations if indicated, with other d/c instructions as indicated by MD - patient able to verbalize understanding, all questions fully answered. Patient instructed to return to ED, call 911, or call MD for any changes in condition.  Patient escorted via [place], and D/C home via private auto.  Felicie Morn, RN 03/12/2020 2:47 PM

## 2020-03-12 NOTE — ED Notes (Signed)
MD notified of elevated BP - CT ordered.

## 2020-03-12 NOTE — Progress Notes (Signed)
Inpatient Diabetes Program Recommendations  AACE/ADA: New Consensus Statement on Inpatient Glycemic Control (2015)  Target Ranges:  Prepandial:   less than 140 mg/dL      Peak postprandial:   less than 180 mg/dL (1-2 hours)      Critically ill patients:  140 - 180 mg/dL   Lab Results  Component Value Date   GLUCAP 192 (H) 09/27/2019   HGBA1C 11.9 (H) 09/24/2019    Review of Glycemic Control  Diabetes history: DM 2 Outpatient Diabetes medications: Lantus 30 units, Humalog 15 units tid meal coverage Current orders for Inpatient glycemic control: Being evaluated in ED  Inpatient Diabetes Program Recommendations:    If admitted consider  -  Lantus 24 units (80% of home dose) -  Novolog 0-15 units tid + hs scale  -  May need to add Novolog meal coverage if glucose trends increase after meal intake as pt takes meal coverage at home.  Thanks,  Tama Headings RN, MSN, BC-ADM Inpatient Diabetes Coordinator Team Pager [phone] (8a-5p)

## 2020-03-12 NOTE — ED Provider Notes (Signed)
[facility] EMERGENCY DEPARTMENT Provider Note   CSN: [id number] Arrival date & time: 03/12/20  9509     History Chief Complaint  Patient presents with  . Shoulder Pain  . Leg Swelling    Chelsey Santiago is a 44 y.o. female.  Patient presenting with concern of some left arm pain.  Also with concern was some increased leg swelling.  Patient has a history of chronic combined systolic and diastolic congestive heart failure.  No anterior chest pain but may be some discomfort left anterior chest.  Patient is followed by cardiology here in [place].  Patient is on furosemide.  Currently taking 2 tablets twice a day.  At times in the past has been up to 5 tablets a day.        Past Medical History:  Diagnosis Date  . Anemia    H&H of 10.6/33 and 07/2008 and 11.9/35 and 09/2010  . Anxiety   . Chronic combined systolic and diastolic CHF (congestive heart failure) ([place])   . Depression with anxiety   . Diabetes mellitus without complication ([place])   . Hypertension   . Hypertensive heart disease 2009   Pulmonary edema postpartum; mild to moderate mitral regurgitation when hospitalized for CHF in 2009; Echocardiogram in 12/2009-no MR and normal EF; normal CXR in 09/2010  . Migraine headache   . Miscarriage 03/19/2013  . Obesity 04/16/2009  . Osteoarthritis, knee 03/29/2011  . Preeclampsia   . Pulmonary edema   . Sleep apnea   . Threatened abortion in early pregnancy 03/15/2013    Patient Active Problem List   Diagnosis Date Noted  . Back pain 12/19/2019  . Vaginal discharge 12/19/2019  . Vaginal itching 12/19/2019  . BV (bacterial vaginosis) 12/19/2019  . Chronic hypertension 12/19/2019  . Fibroids 09/26/2019  . S/P hysterectomy 09/26/2019  . Acute blood loss anemia 09/26/2019  . Abdominal pain, epigastric 03/14/2019  . Dysphagia 03/14/2019  . Gastroesophageal reflux disease 03/14/2019  . Class 2 obesity   . Acute exacerbation of CHF (congestive heart failure) ([place])  10/26/2018  . Chronic combined systolic and diastolic CHF (congestive heart failure) ([place]) 06/02/2018  . Uncontrolled type 2 diabetes mellitus with hyperglycemia ([place]) 06/02/2018  . Influenza B 05/13/2018  . Hypomagnesemia 05/12/2018  . Headache 05/12/2018  . Upper respiratory tract infection   . HCAP (healthcare-associated pneumonia)   . Constipation 10/17/2017  . Bad headache   . CHF exacerbation ([place]) 09/29/2017  . Iron deficiency anemia 05/16/2017  . Vitamin D deficiency 11/25/2016  . Iron deficiency 11/25/2016  . History of acute myocardial infarction 10/05/2016  . CHF (congestive heart failure) ([place]) 05/06/2016  . Diabetes mellitus with complication ([place]) 32/67/1245  . Leukocytosis 05/06/2016  . Neuropathy 05/06/2016  . Depression 04/16/2016  . Chronic tension-type headache, not intractable 04/16/2016  . AKI (acute kidney injury) ([place])   . Hyperkalemia   . Nonischemic cardiomyopathy ([place])   . Acute on chronic combined systolic and diastolic ACC/AHA stage C congestive heart failure ([place]) 04/03/2016  . Acute on chronic combined systolic and diastolic CHF, NYHA class 4 ([place]) 04/03/2016  . Hypertensive emergency 04/03/2016  . Cardiomyopathy due to hypertension ([place]) 12/22/2015  . Normal coronary arteries 12/22/2015  . Troponin level elevated 12/22/2015  . NSTEMI (non-ST elevated myocardial infarction) ([place]) 12/20/2015  . Dental infection 10/10/2015  . Chest pain 09/05/2015  . Systolic CHF, chronic ([place]) 09/05/2015  . LLQ pain   . Type 2 diabetes mellitus without complication ([place]) 80/99/8338  . Essential hypertension   .  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 Hypertensive urgency 04/22/2014  . Acute CHF ([place]) 04/22/2014  . S/P cesarean section 04/11/2014  . Acute pulmonary edema ([place]) 04/11/2014  . Postoperative anemia 04/11/2014  . Elevated serum creatinine 04/11/2014  . Preeclampsia, severe 04/09/2014  . Pre-eclampsia superimposed on chronic hypertension, antepartum  04/08/2014  . Dyspnea 04/08/2014  . Polyhydramnios in third trimester, antepartum 03/14/2014  . Abnormal maternal glucose tolerance, antepartum 03/11/2014  . High-risk pregnancy 03/11/2014  . Pre-existing essential hypertension complicating pregnancy 03/13/2535  . Impaired glucose tolerance during pregnancy, antepartum 11/27/2013  . Leiomyoma of uterus 11/22/2013  . History of gestational diabetes in prior pregnancy, currently pregnant in first trimester 11/22/2013  . Hx of preeclampsia, prior pregnancy, currently pregnant 11/22/2013  . Short interval between pregnancies affecting pregnancy, antepartum 11/22/2013  . Supervision of high-risk pregnancy of elderly primigravida (>= 17 years old at delivery), third trimester 11/22/2013  . Miscarriage 03/19/2013  . Major depressive disorder, single episode, unspecified 09/27/2011  . Hypertension   . Hypertensive cardiovascular disease   . Microcytic anemia   . Osteoarthrosis involving lower leg 03/29/2011  . Hypokalemia 12/12/2009  . OSA on CPAP 12/09/2009  . Morbid obesity ([place]) 04/16/2009    Past Surgical History:  Procedure Laterality Date  . BREAST REDUCTION SURGERY  2002  . CARDIAC CATHETERIZATION N/A 12/22/2015   Procedure: Left Heart Cath and Coronary Angiography;  Surgeon: Peter M Martinique, MD;  Location: [facility];  Service: Cardiovascular;  Laterality: N/A;  . CESAREAN SECTION N/A 04/09/2014   Procedure: CESAREAN SECTION;  Surgeon: Mora Bellman, MD;  Location: [facility];  Service: Obstetrics;  Laterality: N/A;  . CHOLECYSTECTOMY    . HYSTERECTOMY ABDOMINAL WITH SALPINGECTOMY N/A 09/26/2019   Procedure: HYSTERECTOMY ABDOMINAL WITH SALPINGECTOMY;  Surgeon: Florian Buff, MD;  Location: [facility];  Service: Gynecology;  Laterality: N/A;  . LIPOMA EXCISION Right 09/26/2019   Procedure: EXCISION LIPOMA RIGHT VULVAR;  Surgeon: Florian Buff, MD;  Location: [facility];  Service: Gynecology;  Laterality: Right;     OB History     Gravida  11   Para  6   Term  5   Preterm  1   AB  5   Living  6     SAB  3   TAB  2   Ectopic      Multiple  0   Live Births  6           Family History  Problem Relation Age of Onset  . Diabetes Mother 53  . Heart disease Mother   . Hyperlipidemia Paternal Grandfather   . Hypertension Paternal Grandfather   . Heart disease Father   . Hypertension Father   . Heart disease Maternal Grandmother 60  . ADD / ADHD Son   . Hypertension Maternal Uncle   . Heart attack Brother   . Sudden death Neg Hx   . Colon cancer Neg Hx   . Celiac disease Neg Hx   . Inflammatory bowel disease Neg Hx     Social History   Tobacco Use  . Smoking status: Never Smoker  . Smokeless tobacco: Never Used  Vaping Use  . Vaping Use: Never used  Substance Use Topics  . Alcohol use: Yes    Comment: occ  . Drug use: No    Home Medications Prior to Admission medications   Medication Sig Start Date End Date Taking? Authorizing Provider  albuterol (PROVENTIL HFA;VENTOLIN HFA) 108 (90 Base) MCG/ACT inhaler Inhale  1 puff into the lungs every 6 (six) hours as needed for wheezing or shortness of breath.    [provider]  amLODipine (NORVASC) 10 MG tablet Take 1 tablet (10 mg total) by mouth daily. 09/04/19 12/19/19  Strader, Fransisco Hertz, PA-C  ARIPiprazole (ABILIFY) 10 MG tablet Take 1 tablet (10 mg total) by mouth daily. 10/30/18   Barton Dubois, MD  aspirin EC 81 MG tablet Take 81 mg by mouth daily.    [provider]  budesonide-formoterol (SYMBICORT) 80-4.5 MCG/ACT inhaler Inhale 2 puffs into the lungs 2 (two) times daily. 05/04/18   Manuella Ghazi, Pratik D, DO  carvedilol (COREG) 25 MG tablet Take 2 tablets (50 mg total) by mouth 2 (two) times daily with a meal. 01/23/20   Branch, Alphonse Guild, MD  clonazePAM (KLONOPIN) 0.5 MG tablet Take 0.5 mg by mouth 2 (two) times daily. 12/19/18   [provider]  fluconazole (DIFLUCAN) 150 MG tablet Take 1 now and 1 in 3 days 12/19/19    Derrek Monaco A, NP  gabapentin (NEURONTIN) 600 MG tablet Take 600 mg by mouth 3 (three) times daily.     [provider]  hydrALAZINE (APRESOLINE) 100 MG tablet Take 1 tablet (100 mg total) by mouth 3 (three) times daily. 01/23/20   Arnoldo Lenis, MD  ibuprofen (ADVIL) 800 MG tablet Take 1 tablet (800 mg total) by mouth every 6 (six) hours. 09/28/19   Florian Buff, MD  insulin glargine (LANTUS) 100 UNIT/ML injection Inject 30 Units at bedtime into the skin.     [provider]  insulin lispro (HUMALOG) 100 UNIT/ML injection Inject 15 Units into the skin 3 (three) times daily before meals.     [provider]  Ipratropium-Albuterol (COMBIVENT RESPIMAT) 20-100 MCG/ACT AERS respimat Inhale 1 puff into the lungs every 6 (six) hours as needed for wheezing or shortness of breath. 05/04/18   Manuella Ghazi, Pratik D, DO  linaclotide Rolan Lipa) 145 MCG CAPS capsule Take 1 capsule (145 mcg total) by mouth daily before breakfast. 03/14/19   Erenest Rasher, PA-C  metroNIDAZOLE (FLAGYL) 500 MG tablet Take 1 tablet (500 mg total) by mouth 2 (two) times daily. 12/19/19   Estill Dooms, NP  nitroGLYCERIN (NITROSTAT) 0.4 MG SL tablet Place 1 tablet (0.4 mg total) under the tongue every 5 (five) minutes as needed for chest pain. 09/05/15   Kathie Dike, MD  ondansetron (ZOFRAN ODT) 8 MG disintegrating tablet Take 1 tablet (8 mg total) by mouth every 8 (eight) hours as needed for nausea or vomiting. 09/06/19   Long, Wonda Olds, MD  potassium chloride SA (KLOR-CON M20) 20 MEQ tablet Take 2 tablets (40 mEq total) by mouth daily. 09/04/19 12/19/19  Strader, Fransisco Hertz, PA-C  potassium chloride SA (KLOR-CON) 20 MEQ tablet Take 1 tablet (20 mEq total) by mouth 2 (two) times daily. 03/12/20   Fredia Sorrow, MD  promethazine-codeine (PHENERGAN WITH CODEINE) 6.25-10 MG/5ML syrup Take 5 mLs by mouth every 4 (four) hours as needed for cough. 02/06/20   Evalee Jefferson, PA-C  pyridostigmine (MESTINON) 60  MG tablet Take 60 mg by mouth every 8 (eight) hours.    [provider]  sacubitril-valsartan (ENTRESTO) 97-103 MG Take 1 tablet by mouth 2 (two) times daily. 03/16/19   Arnoldo Lenis, MD  torsemide (DEMADEX) 20 MG tablet Take 2 tablets (40 mg total) by mouth daily. 09/04/19 12/19/19  Strader, Fransisco Hertz, PA-C  traZODone (DESYREL) 100 MG tablet Take 2 tablets (200 mg total)  by mouth at bedtime. 10/29/18   Barton Dubois, MD  triamcinolone ointment (KENALOG) 0.5 % Apply 1 application topically 2 (two) times daily. 12/19/19   Estill Dooms, NP    Allergies    Diclofenac, Tramadol, and Vicodin [hydrocodone-acetaminophen]  Review of Systems   Review of Systems  Constitutional: Negative for chills and fever.  HENT: Negative for congestion, rhinorrhea and sore throat.   Eyes: Negative for visual disturbance.  Respiratory: Negative for cough and shortness of breath.   Cardiovascular: Positive for chest pain and leg swelling.  Gastrointestinal: Negative for abdominal pain, diarrhea, nausea and vomiting.  Genitourinary: Negative for dysuria.  Musculoskeletal: Negative for back pain and neck pain.  Skin: Negative for rash.  Neurological: Negative for dizziness, light-headedness and headaches.  Hematological: Does not bruise/bleed easily.  Psychiatric/Behavioral: Negative for confusion.    Physical Exam Updated Vital Signs BP 112/61   Pulse 83   Temp 98.2 F (36.8 C) (Oral)   Resp (!) 26   Ht 1.676 m (5\' 6" )   Wt 102.1 kg   LMP 08/21/2019   SpO2 98%   BMI 36.32 kg/m   Physical Exam Vitals and nursing note reviewed.  Constitutional:      General: She is not in acute distress.    Appearance: Normal appearance. She is well-developed.  HENT:     Head: Normocephalic and atraumatic.  Eyes:     Extraocular Movements: Extraocular movements intact.     Conjunctiva/sclera: Conjunctivae normal.     Pupils: Pupils are equal, round, and reactive to light.  Cardiovascular:      Rate and Rhythm: Normal rate and regular rhythm.     Heart sounds: No murmur heard.   Pulmonary:     Effort: Pulmonary effort is normal. No respiratory distress.     Breath sounds: Normal breath sounds.  Chest:     Chest wall: No tenderness.  Abdominal:     Palpations: Abdomen is soft.     Tenderness: There is no abdominal tenderness.  Musculoskeletal:        General: Normal range of motion.     Cervical back: Normal range of motion and neck supple.     Right lower leg: No edema.     Left lower leg: No edema.  Skin:    General: Skin is warm and dry.  Neurological:     General: No focal deficit present.     Mental Status: She is alert and oriented to person, place, and time.     Cranial Nerves: No cranial nerve deficit.     Sensory: No sensory deficit.     Motor: No weakness.     ED Results / Procedures / Treatments   Labs (all labs ordered are listed, but only abnormal results are displayed) Labs Reviewed  BASIC METABOLIC PANEL - Abnormal; Notable for the following components:      Result Value   Sodium 132 (*)    Potassium 3.1 (*)    Chloride 92 (*)    Glucose, Bld 310 (*)    Calcium 8.8 (*)    All other components within normal limits  CBC - Abnormal; Notable for the following components:   MCV 75.9 (*)    MCH 23.5 (*)    All other components within normal limits  BRAIN NATRIURETIC PEPTIDE - Abnormal; Notable for the following components:   B Natriuretic Peptide 813.0 (*)    All other components within normal limits  TROPONIN I (HIGH SENSITIVITY) - Abnormal; Notable  for the following components:   Troponin I (High Sensitivity) 22 (*)    All other components within normal limits  TROPONIN I (HIGH SENSITIVITY) - Abnormal; Notable for the following components:   Troponin I (High Sensitivity) 21 (*)    All other components within normal limits    EKG EKG Interpretation  Date/Time:  Wednesday March 12 2020 07:20:29 EDT Ventricular Rate:  78 PR Interval:      QRS Duration: 105 QT Interval:  541 QTC Calculation: 617 R Axis:   116 Text Interpretation: Sinus rhythm Right axis deviation Consider left ventricular hypertrophy Borderline abnrm T, anterolateral leads Prolonged QT interval T wave changes new Confirmed by Fredia Sorrow [phone]) on 03/12/2020 7:31:38 AM   Radiology DG Chest 2 View  Result Date: 03/12/2020 CLINICAL DATA:  Left shoulder pain EXAM: CHEST - 2 VIEW COMPARISON:  02/06/2020 FINDINGS: Cardiomegaly. Both lungs are clear. The visualized skeletal structures are unremarkable. IMPRESSION: Cardiomegaly without acute abnormality of the lungs. Electronically Signed   By: Eddie Candle M.D.   On: 03/12/2020 08:11   CT Head Wo Contrast  Result Date: 03/12/2020 CLINICAL DATA:  Headache, intracranial hemorrhage suspected. EXAM: CT HEAD WITHOUT CONTRAST TECHNIQUE: Contiguous axial images were obtained from the base of the skull through the vertex without intravenous contrast. COMPARISON:  None. FINDINGS: Brain: No evidence of acute large vascular territory infarction, hemorrhage, hydrocephalus, extra-axial collection or mass lesion/mass effect. Minimal periventricular hypoattenuation, most likely the sequela of chronic microvascular ischemic disease. Vascular: No hyperdense vessel or unexpected calcification. Skull: Normal. Negative for fracture or focal lesion. Sinuses/Orbits: The visualized sinuses are clear. Unremarkable orbits. Other: No mastoid effusions. IMPRESSION: No evidence of acute intracranial abnormality. Electronically Signed   By: Margaretha Sheffield MD   On: 03/12/2020 12:09    Procedures Procedures (including critical care time)  Medications Ordered in ED Medications  potassium chloride SA (KLOR-CON) CR tablet 40 mEq (40 mEq Oral Given 03/12/20 0945)  furosemide (LASIX) injection 80 mg (80 mg Intravenous Given 03/12/20 0945)  hydrALAZINE (APRESOLINE) tablet 100 mg (100 mg Oral Given 03/12/20 1118)    ED Course  I have  reviewed the triage vital signs and the nursing notes.  Pertinent labs & imaging results that were available during my care of the patient were reviewed by me and considered in my medical decision making (see chart for details).    MDM Rules/Calculators/A&P                          Patient's oxygen saturations are fine here.  Potassium was little low at 3.1.  Patient get 40 mEq of potassium p.o.  We will continue with potassium at home over the next several days.  Have her follow-up with her primary care doctor.  Her troponins were stable at around low 20s.  No significant change.  BNP was elevated but chest x-ray without evidence of any significant fluid.  Patient given 80 mg of Lasix here and she feels better.  We will have her follow-up with cardiology.  We will have her increase her Turow Sumayya to 5 times a day.     Final Clinical Impression(s) / ED Diagnoses Final diagnoses:  Hypokalemia  Acute on chronic combined systolic and diastolic congestive heart failure ([place])    Rx / DC Orders ED Discharge Orders         Ordered    potassium chloride SA (KLOR-CON) 20 MEQ tablet  2 times daily  03/12/20 1444           Fredia Sorrow, MD 03/12/20 1447

## 2020-03-13 ENCOUNTER — Ambulatory Visit: Payer: Medicaid Other | Admitting: Student

## 2020-03-13 NOTE — Progress Notes (Deleted)
Cardiology Office Note    Date:  03/13/2020   ID:  Chelsey Santiago, DOB 09/22/1975, MRN [id number]  PCP:  Rosita Fire, MD  Cardiologist: Carlyle Dolly, MD    No chief complaint on file.   History of Present Illness:    Chelsey Santiago is a 44 y.o. female with past medical history of chronic combined systolic and diastolic CHF/NICM (EF 16-01% by echo in 12/2015 with cath showing normal cors, EF at 45% by repeat echo in 10/2018), HTN, IDDM and OSA who presents to the office today for Emergency Department follow-up.   She was last examined by myself in 08/2019 and denied any recent chest pain or palpitations at that time. BP was significantly elevated at 160/115 and she was continued on Coreg 50 mg twice daily, Entresto 97-103 mg twice daily and Torsemide 40 mg daily with Amlodipine being titrated from 5 mg daily to 10 mg daily. She had requested preoperative cardiac clearance for hysterectomy but this was postponed until BP had improed. BP was improved at 122/68 at the time of her nurse visit on 09/06/2019 and she was cleared to proceed with planned hysterectomy. Underwent abdominal hysterectomy on 09/26/2019 by Dr. Elonda Husky with no immediate complications noted.   She most recently presented to [facility] ED on 03/12/2020 for evaluation of right shoulder pain and chest pain for the past 2 days. Labs showed WBC 9.4, Hgb 12.0, platelets 259, Na+ 132, K+ 3.1 and creatinine 0.98. Initial and delta HS Troponin were negative at 22 and 21. BNP elevated to 813. CXR showed cardiomegaly with no acute findings. EKG showed NSR, HR 78 with LVH and TWI along the anterolateral leads which is similar to prior tracings. She was given a dose of IV Lasix and informed to follow-up with Cardiology as an outpatient.    Past Medical History:  Diagnosis Date  . Anemia    H&H of 10.6/33 and 07/2008 and 11.9/35 and 09/2010  . Anxiety   . Chronic combined systolic and diastolic CHF (congestive heart failure) ([place])     . Depression with anxiety   . Diabetes mellitus without complication ([place])   . Hypertension   . Hypertensive heart disease 2009   Pulmonary edema postpartum; mild to moderate mitral regurgitation when hospitalized for CHF in 2009; Echocardiogram in 12/2009-no MR and normal EF; normal CXR in 09/2010  . Migraine headache   . Miscarriage 03/19/2013  . Obesity 04/16/2009  . Osteoarthritis, knee 03/29/2011  . Preeclampsia   . Pulmonary edema   . Sleep apnea   . Threatened abortion in early pregnancy 03/15/2013    Past Surgical History:  Procedure Laterality Date  . BREAST REDUCTION SURGERY  2002  . CARDIAC CATHETERIZATION N/A 12/22/2015   Procedure: Left Heart Cath and Coronary Angiography;  Surgeon: Peter M Martinique, MD;  Location: [facility];  Service: Cardiovascular;  Laterality: N/A;  . CESAREAN SECTION N/A 04/09/2014   Procedure: CESAREAN SECTION;  Surgeon: Mora Bellman, MD;  Location: [facility];  Service: Obstetrics;  Laterality: N/A;  . CHOLECYSTECTOMY    . HYSTERECTOMY ABDOMINAL WITH SALPINGECTOMY N/A 09/26/2019   Procedure: HYSTERECTOMY ABDOMINAL WITH SALPINGECTOMY;  Surgeon: Florian Buff, MD;  Location: [facility];  Service: Gynecology;  Laterality: N/A;  . LIPOMA EXCISION Right 09/26/2019   Procedure: EXCISION LIPOMA RIGHT VULVAR;  Surgeon: Florian Buff, MD;  Location: [facility];  Service: Gynecology;  Laterality: Right;    Current Medications: Outpatient Medications Prior to Visit  Medication Sig Dispense Refill  .  albuterol (PROVENTIL HFA;VENTOLIN HFA) 108 (90 Base) MCG/ACT inhaler Inhale 1 puff into the lungs every 6 (six) hours as needed for wheezing or shortness of breath.    Marland Kitchen amLODipine (NORVASC) 10 MG tablet Take 1 tablet (10 mg total) by mouth daily. 90 tablet 3  . ARIPiprazole (ABILIFY) 10 MG tablet Take 1 tablet (10 mg total) by mouth daily.    Marland Kitchen aspirin EC 81 MG tablet Take 81 mg by mouth daily.    . budesonide-formoterol (SYMBICORT) 80-4.5 MCG/ACT inhaler Inhale 2  puffs into the lungs 2 (two) times daily. 1 Inhaler 12  . carvedilol (COREG) 25 MG tablet Take 2 tablets (50 mg total) by mouth 2 (two) times daily with a meal. 360 tablet 3  . clonazePAM (KLONOPIN) 0.5 MG tablet Take 0.5 mg by mouth 2 (two) times daily.    . fluconazole (DIFLUCAN) 150 MG tablet Take 1 now and 1 in 3 days 2 tablet 1  . gabapentin (NEURONTIN) 600 MG tablet Take 600 mg by mouth 3 (three) times daily.     . hydrALAZINE (APRESOLINE) 100 MG tablet Take 1 tablet (100 mg total) by mouth 3 (three) times daily. 90 tablet 1  . ibuprofen (ADVIL) 800 MG tablet Take 1 tablet (800 mg total) by mouth every 6 (six) hours. 30 tablet 0  . insulin glargine (LANTUS) 100 UNIT/ML injection Inject 30 Units at bedtime into the skin.     Marland Kitchen insulin lispro (HUMALOG) 100 UNIT/ML injection Inject 15 Units into the skin 3 (three) times daily before meals.     . Ipratropium-Albuterol (COMBIVENT RESPIMAT) 20-100 MCG/ACT AERS respimat Inhale 1 puff into the lungs every 6 (six) hours as needed for wheezing or shortness of breath. 1 Inhaler 0  . linaclotide (LINZESS) 145 MCG CAPS capsule Take 1 capsule (145 mcg total) by mouth daily before breakfast. 30 capsule 5  . metroNIDAZOLE (FLAGYL) 500 MG tablet Take 1 tablet (500 mg total) by mouth 2 (two) times daily. 14 tablet 0  . nitroGLYCERIN (NITROSTAT) 0.4 MG SL tablet Place 1 tablet (0.4 mg total) under the tongue every 5 (five) minutes as needed for chest pain. 30 tablet 12  . ondansetron (ZOFRAN ODT) 8 MG disintegrating tablet Take 1 tablet (8 mg total) by mouth every 8 (eight) hours as needed for nausea or vomiting. 20 tablet 0  . potassium chloride SA (KLOR-CON M20) 20 MEQ tablet Take 2 tablets (40 mEq total) by mouth daily. 180 tablet 3  . potassium chloride SA (KLOR-CON) 20 MEQ tablet Take 1 tablet (20 mEq total) by mouth 2 (two) times daily. 6 tablet 0  . promethazine-codeine (PHENERGAN WITH CODEINE) 6.25-10 MG/5ML syrup Take 5 mLs by mouth every 4 (four) hours  as needed for cough. 120 mL 0  . pyridostigmine (MESTINON) 60 MG tablet Take 60 mg by mouth every 8 (eight) hours.    . sacubitril-valsartan (ENTRESTO) 97-103 MG Take 1 tablet by mouth 2 (two) times daily. 60 tablet 6  . torsemide (DEMADEX) 20 MG tablet Take 2 tablets (40 mg total) by mouth daily. 120 tablet 11  . traZODone (DESYREL) 100 MG tablet Take 2 tablets (200 mg total) by mouth at bedtime.    . triamcinolone ointment (KENALOG) 0.5 % Apply 1 application topically 2 (two) times daily. 30 g 0   No facility-administered medications prior to visit.     Allergies:   Diclofenac, Tramadol, and Vicodin [hydrocodone-acetaminophen]   Social History   Socioeconomic History  . Marital status: Married  Spouse name: Not on file  . Number of children: 6  . Years of education: Not on file  . Highest education level: Not on file  Occupational History  . Occupation: unemployed    Fish farm manager: UNEMPLOYED  Tobacco Use  . Smoking status: Never Smoker  . Smokeless tobacco: Never Used  Vaping Use  . Vaping Use: Never used  Substance and Sexual Activity  . Alcohol use: Yes    Comment: occ  . Drug use: No  . Sexual activity: Yes    Birth control/protection: Surgical    Comment: hyst  Other Topics Concern  . Not on file  Social History Narrative   Lives in [place]   Engaged/boyfriend (father to youngest chid)   4 children: daughter (34 as of 2013), sons (15, 71, 65 as of 2013)   Religion: christian   Social Determinants of Radio broadcast assistant Strain:   . Difficulty of Paying Living Expenses: Not on file  Food Insecurity:   . Worried About Charity fundraiser in the Last Year: Not on file  . Ran Out of Food in the Last Year: Not on file  Transportation Needs:   . Lack of Transportation (Medical): Not on file  . Lack of Transportation (Non-Medical): Not on file  Physical Activity:   . Days of Exercise per Week: Not on file  . Minutes of Exercise per Session: Not on file  Stress:    . Feeling of Stress : Not on file  Social Connections:   . Frequency of Communication with Friends and Family: Not on file  . Frequency of Social Gatherings with Friends and Family: Not on file  . Attends Religious Services: Not on file  . Active Member of Clubs or Organizations: Not on file  . Attends Archivist Meetings: Not on file  . Marital Status: Not on file     Family History:  The patient's ***family history includes ADD / ADHD in her son; Diabetes (age of onset: 57) in her mother; Heart attack in her brother; Heart disease in her father and mother; Heart disease (age of onset: 17) in her maternal grandmother; Hyperlipidemia in her paternal grandfather; Hypertension in her father, maternal uncle, and paternal grandfather.   Review of Systems:   Please see the history of present illness.     General:  No chills, fever, night sweats or weight changes.  Cardiovascular:  No chest pain, dyspnea on exertion, edema, orthopnea, palpitations, paroxysmal nocturnal dyspnea. Dermatological: No rash, lesions/masses Respiratory: No cough, dyspnea Urologic: No hematuria, dysuria Abdominal:   No nausea, vomiting, diarrhea, bright red blood per rectum, melena, or hematemesis Neurologic:  No visual changes, wkns, changes in mental status. All other systems reviewed and are otherwise negative except as noted above.   Physical Exam:    VS:  LMP 08/21/2019    General: Well developed, well nourished,female appearing in no acute distress. Head: Normocephalic, atraumatic. Neck: No carotid bruits. JVD not elevated.  Lungs: Respirations regular and unlabored, without wheezes or rales.  Heart: ***Regular rate and rhythm. No S3 or S4.  No murmur, no rubs, or gallops appreciated. Abdomen: Appears non-distended. No obvious abdominal masses. Msk:  Strength and tone appear normal for age. No obvious joint deformities or effusions. Extremities: No clubbing or cyanosis. No edema.  Distal pedal  pulses are 2+ bilaterally. Neuro: Alert and oriented X 3. Moves all extremities spontaneously. No focal deficits noted. Psych:  Responds to questions appropriately with a normal affect. Skin:  No rashes or lesions noted  Wt Readings from Last 3 Encounters:  03/12/20 225 lb (102.1 kg)  12/19/19 222 lb (100.7 kg)  11/06/19 225 lb (102.1 kg)        Studies/Labs Reviewed:   EKG:  EKG is*** ordered today.  The ekg ordered today demonstrates ***  Recent Labs: 06/05/2019: TSH 0.719 09/24/2019: ALT 21 09/27/2019: Magnesium 2.1 03/12/2020: B Natriuretic Peptide 813.0; BUN 14; Creatinine, Ser 0.98; Hemoglobin 12.0; Platelets 259; Potassium 3.1; Sodium 132   Lipid Panel    Component Value Date/Time   CHOL 211 (H) 06/03/2018 0615   TRIG 223 (H) 06/03/2018 0615   HDL 39 (L) 06/03/2018 0615   CHOLHDL 5.4 06/03/2018 0615   VLDL 45 (H) 06/03/2018 0615   LDLCALC 127 (H) 06/03/2018 0615    Additional studies/ records that were reviewed today include:   Cardiac Catheterization: 12/2015  The left ventricular systolic function is normal.  LV end diastolic pressure is mildly elevated.  The left ventricular ejection fraction is 50-55% by visual estimate.   1. No significant CAD 2. Good LV function 3. Mildly elevated LV EDP.  Plan: medical therapy   Echocardiogram: 10/2018 IMPRESSIONS    1. The left ventricle has a visually estimated ejection fraction of 45%.  The cavity size was normal. There is severe concentric left ventricular  hypertrophy. Left ventricular diastolic Doppler parameters are consistent  with pseudonormalization. Elevated  left ventricular end-diastolic pressure Left ventricular diffuse  hypokinesis.  2. The right ventricle has normal systolic function. The cavity was  normal. There is no increase in right ventricular wall thickness.  3. Left atrial size was severely dilated.  4. The mitral valve is grossly normal.  5. The tricuspid valve is grossly  normal.  6. The aortic valve is tricuspid. Mild thickening of the aortic valve.  7. Moderate aortic annular calcification.  8. The aortic root is normal in size and structure.   Assessment:    No diagnosis found.   Plan:   In order of problems listed above:  1. ***    Medication Adjustments/Labs and Tests Ordered: Current medicines are reviewed at length with the patient today.  Concerns regarding medicines are outlined above.  Medication changes, Labs and Tests ordered today are listed in the Patient Instructions below. There are no Patient Instructions on file for this visit.   Signed, Erma Heritage, PA-C  03/13/2020 11:48 AM    [facility] [address] [place], [place] [address] Phone: [phone] Fax: [phone]

## 2020-03-27 ENCOUNTER — Encounter (HOSPITAL_COMMUNITY): Payer: Self-pay | Admitting: *Deleted

## 2020-03-27 ENCOUNTER — Emergency Department (HOSPITAL_COMMUNITY): Payer: Medicaid Other

## 2020-03-27 ENCOUNTER — Inpatient Hospital Stay (HOSPITAL_COMMUNITY)
Admission: EM | Admit: 2020-03-27 | Discharge: 2020-03-30 | DRG: 291 | Disposition: A | Discharge status: Home / Self Care | Payer: Medicaid Other | Attending: Internal Medicine | Admitting: Internal Medicine

## 2020-03-27 ENCOUNTER — Other Ambulatory Visit: Payer: Self-pay

## 2020-03-27 DIAGNOSIS — E1169 Type 2 diabetes mellitus with other specified complication: Secondary | ICD-10-CM | POA: Diagnosis not present

## 2020-03-27 DIAGNOSIS — Z791 Long term (current) use of non-steroidal anti-inflammatories (NSAID): Secondary | ICD-10-CM | POA: Diagnosis not present

## 2020-03-27 DIAGNOSIS — R000 Tachycardia, unspecified: Secondary | ICD-10-CM | POA: Diagnosis present

## 2020-03-27 DIAGNOSIS — J40 Bronchitis, not specified as acute or chronic: Secondary | ICD-10-CM | POA: Diagnosis present

## 2020-03-27 DIAGNOSIS — Z7982 Long term (current) use of aspirin: Secondary | ICD-10-CM

## 2020-03-27 DIAGNOSIS — I161 Hypertensive emergency: Secondary | ICD-10-CM | POA: Diagnosis present

## 2020-03-27 DIAGNOSIS — I252 Old myocardial infarction: Secondary | ICD-10-CM | POA: Diagnosis not present

## 2020-03-27 DIAGNOSIS — F32A Depression, unspecified: Secondary | ICD-10-CM | POA: Diagnosis present

## 2020-03-27 DIAGNOSIS — Z8249 Family history of ischemic heart disease and other diseases of the circulatory system: Secondary | ICD-10-CM

## 2020-03-27 DIAGNOSIS — R9431 Abnormal electrocardiogram [ECG] [EKG]: Secondary | ICD-10-CM | POA: Diagnosis present

## 2020-03-27 DIAGNOSIS — Z90710 Acquired absence of both cervix and uterus: Secondary | ICD-10-CM | POA: Diagnosis not present

## 2020-03-27 DIAGNOSIS — M1710 Unilateral primary osteoarthritis, unspecified knee: Secondary | ICD-10-CM | POA: Diagnosis present

## 2020-03-27 DIAGNOSIS — F419 Anxiety disorder, unspecified: Secondary | ICD-10-CM | POA: Diagnosis present

## 2020-03-27 DIAGNOSIS — I951 Orthostatic hypotension: Secondary | ICD-10-CM | POA: Diagnosis present

## 2020-03-27 DIAGNOSIS — E118 Type 2 diabetes mellitus with unspecified complications: Secondary | ICD-10-CM | POA: Diagnosis not present

## 2020-03-27 DIAGNOSIS — I110 Hypertensive heart disease with heart failure: Secondary | ICD-10-CM | POA: Diagnosis present

## 2020-03-27 DIAGNOSIS — J9601 Acute respiratory failure with hypoxia: Secondary | ICD-10-CM | POA: Diagnosis present

## 2020-03-27 DIAGNOSIS — Z20822 Contact with and (suspected) exposure to covid-19: Secondary | ICD-10-CM | POA: Diagnosis present

## 2020-03-27 DIAGNOSIS — Z794 Long term (current) use of insulin: Secondary | ICD-10-CM | POA: Diagnosis not present

## 2020-03-27 DIAGNOSIS — Z9989 Dependence on other enabling machines and devices: Secondary | ICD-10-CM

## 2020-03-27 DIAGNOSIS — E1165 Type 2 diabetes mellitus with hyperglycemia: Secondary | ICD-10-CM | POA: Diagnosis present

## 2020-03-27 DIAGNOSIS — I5043 Acute on chronic combined systolic (congestive) and diastolic (congestive) heart failure: Secondary | ICD-10-CM | POA: Diagnosis present

## 2020-03-27 DIAGNOSIS — Z6836 Body mass index (BMI) 36.0-36.9, adult: Secondary | ICD-10-CM | POA: Diagnosis not present

## 2020-03-27 DIAGNOSIS — G4733 Obstructive sleep apnea (adult) (pediatric): Secondary | ICD-10-CM | POA: Diagnosis present

## 2020-03-27 DIAGNOSIS — J810 Acute pulmonary edema: Secondary | ICD-10-CM | POA: Diagnosis present

## 2020-03-27 DIAGNOSIS — E669 Obesity, unspecified: Secondary | ICD-10-CM | POA: Diagnosis present

## 2020-03-27 DIAGNOSIS — E440 Moderate protein-calorie malnutrition: Secondary | ICD-10-CM | POA: Diagnosis present

## 2020-03-27 DIAGNOSIS — Z833 Family history of diabetes mellitus: Secondary | ICD-10-CM

## 2020-03-27 DIAGNOSIS — R0600 Dyspnea, unspecified: Secondary | ICD-10-CM | POA: Diagnosis present

## 2020-03-27 DIAGNOSIS — Z79899 Other long term (current) drug therapy: Secondary | ICD-10-CM

## 2020-03-27 LAB — CBC WITH DIFFERENTIAL/PLATELET
Abs Immature Granulocytes: 0.07 10*3/uL (ref 0.00–0.07)
Basophils Absolute: 0.1 10*3/uL (ref 0.0–0.1)
Basophils Relative: 1 %
Eosinophils Absolute: 0.1 10*3/uL (ref 0.0–0.5)
Eosinophils Relative: 1 %
HCT: 18 % — ABNORMAL LOW (ref 36.0–46.0)
Hemoglobin: 5.8 g/dL — CL (ref 12.0–15.0)
Immature Granulocytes: 1 %
Lymphocytes Relative: 17 %
Lymphs Abs: 1.9 10*3/uL (ref 0.7–4.0)
MCH: 24.9 pg — ABNORMAL LOW (ref 26.0–34.0)
MCHC: 32.2 g/dL (ref 30.0–36.0)
MCV: 77.3 fL — ABNORMAL LOW (ref 80.0–100.0)
Monocytes Absolute: 0.6 10*3/uL (ref 0.1–1.0)
Monocytes Relative: 5 %
Neutro Abs: 8.3 10*3/uL — ABNORMAL HIGH (ref 1.7–7.7)
Neutrophils Relative %: 75 %
Platelets: 259 10*3/uL (ref 150–400)
RBC: 2.33 MIL/uL — ABNORMAL LOW (ref 3.87–5.11)
RDW: 15.3 % (ref 11.5–15.5)
WBC: 11 10*3/uL — ABNORMAL HIGH (ref 4.0–10.5)
nRBC: 0 % (ref 0.0–0.2)

## 2020-03-27 LAB — COMPREHENSIVE METABOLIC PANEL
ALT: 12 U/L (ref 0–44)
AST: 16 U/L (ref 15–41)
Albumin: 3.3 g/dL — ABNORMAL LOW (ref 3.5–5.0)
Alkaline Phosphatase: 72 U/L (ref 38–126)
Anion gap: 9 (ref 5–15)
BUN: 15 mg/dL (ref 6–20)
CO2: 24 mmol/L (ref 22–32)
Calcium: 8.5 mg/dL — ABNORMAL LOW (ref 8.9–10.3)
Chloride: 98 mmol/L (ref 98–111)
Creatinine, Ser: 1.05 mg/dL — ABNORMAL HIGH (ref 0.44–1.00)
GFR, Estimated: >60 mL/min (ref >60)
Glucose, Bld: 245 mg/dL — ABNORMAL HIGH (ref 70–99)
Potassium: 3.8 mmol/L (ref 3.5–5.1)
Sodium: 131 mmol/L — ABNORMAL LOW (ref 135–145)
Total Bilirubin: 0.6 mg/dL (ref 0.3–1.2)
Total Protein: 7.1 g/dL (ref 6.5–8.1)

## 2020-03-27 LAB — RESPIRATORY PANEL BY RT PCR (FLU A&B, COVID)
Influenza A by PCR: NEGATIVE
Influenza B by PCR: NEGATIVE
SARS Coronavirus 2 by RT PCR: NEGATIVE

## 2020-03-27 LAB — IRON AND TIBC
Iron: 30 ug/dL (ref 28–170)
Saturation Ratios: 9 % — ABNORMAL LOW (ref 10.4–31.8)
TIBC: 353 ug/dL (ref 250–450)
UIBC: 323 ug/dL

## 2020-03-27 LAB — FERRITIN: Ferritin: 29 ng/mL (ref 11–307)

## 2020-03-27 LAB — BRAIN NATRIURETIC PEPTIDE: B Natriuretic Peptide: 444 pg/mL — ABNORMAL HIGH (ref 0.0–100.0)

## 2020-03-27 LAB — VITAMIN B12: Vitamin B-12: 337 pg/mL (ref 180–914)

## 2020-03-27 LAB — FOLATE: Folate: 11.6 ng/mL (ref >5.9)

## 2020-03-27 LAB — POC OCCULT BLOOD, ED: Fecal Occult Bld: NEGATIVE

## 2020-03-27 LAB — TROPONIN I (HIGH SENSITIVITY): Troponin I (High Sensitivity): 25 ng/L — ABNORMAL HIGH (ref <18)

## 2020-03-27 LAB — RETICULOCYTES
Immature Retic Fract: 14.3 % (ref 2.3–15.9)
RBC.: 4.51 MIL/uL (ref 3.87–5.11)
Retic Count, Absolute: 87.5 10*3/uL (ref 19.0–186.0)
Retic Ct Pct: 1.9 % (ref 0.4–3.1)

## 2020-03-27 LAB — HEMOGLOBIN AND HEMATOCRIT, BLOOD
HCT: 34.5 % — ABNORMAL LOW (ref 36.0–46.0)
Hemoglobin: 10.5 g/dL — ABNORMAL LOW (ref 12.0–15.0)

## 2020-03-27 LAB — PREPARE RBC (CROSSMATCH)

## 2020-03-27 MED ORDER — HYDRALAZINE HCL 25 MG PO TABS
100.0000 mg | ORAL_TABLET | Freq: Three times a day (TID) | ORAL | Status: DC
  Administered 2020-03-28 – 2020-03-30 (×7): 100 mg via ORAL
  Filled 2020-03-27 (×2): qty 2
  Filled 2020-03-27 (×3): qty 4
  Filled 2020-03-27: qty 2
  Filled 2020-03-27 (×2): qty 4
  Filled 2020-03-27 (×2): qty 2
  Filled 2020-03-27: qty 4
  Filled 2020-03-27: qty 2
  Filled 2020-03-27: qty 4
  Filled 2020-03-27 (×4): qty 2

## 2020-03-27 MED ORDER — PYRIDOSTIGMINE BROMIDE 60 MG PO TABS
60.0000 mg | ORAL_TABLET | Freq: Three times a day (TID) | ORAL | Status: DC
  Administered 2020-03-28 – 2020-03-30 (×8): 60 mg via ORAL
  Filled 2020-03-27 (×15): qty 1

## 2020-03-27 MED ORDER — SODIUM CHLORIDE 0.9% IV SOLUTION: Freq: Once | INTRAVENOUS | Status: DC

## 2020-03-27 MED ORDER — AMLODIPINE BESYLATE 5 MG PO TABS
10.0000 mg | ORAL_TABLET | Freq: Every day | ORAL | Status: DC
  Administered 2020-03-28 – 2020-03-30 (×4): 10 mg via ORAL
  Filled 2020-03-27 (×4): qty 2

## 2020-03-27 MED ORDER — ASPIRIN EC 81 MG PO TBEC
81.0000 mg | DELAYED_RELEASE_TABLET | Freq: Every day | ORAL | Status: DC
  Administered 2020-03-28 – 2020-03-30 (×3): 81 mg via ORAL
  Filled 2020-03-27 (×3): qty 1

## 2020-03-27 MED ORDER — NITROGLYCERIN 0.4 MG SL SUBL
0.4000 mg | SUBLINGUAL_TABLET | Freq: Once | SUBLINGUAL | Status: AC
  Administered 2020-03-27: 0.4 mg via SUBLINGUAL

## 2020-03-27 MED ORDER — GABAPENTIN 300 MG PO CAPS
600.0000 mg | ORAL_CAPSULE | Freq: Three times a day (TID) | ORAL | Status: DC
  Administered 2020-03-28 – 2020-03-30 (×8): 600 mg via ORAL
  Filled 2020-03-27 (×7): qty 2

## 2020-03-27 MED ORDER — ALBUTEROL SULFATE (2.5 MG/3ML) 0.083% IN NEBU: 5.0000 mg | INHALATION_SOLUTION | Freq: Once | RESPIRATORY_TRACT | Status: DC

## 2020-03-27 MED ORDER — FENTANYL CITRATE (PF) 100 MCG/2ML IJ SOLN
50.0000 ug | Freq: Once | INTRAMUSCULAR | Status: AC
  Administered 2020-03-27: 50 ug via INTRAVENOUS
  Filled 2020-03-27: qty 2

## 2020-03-27 MED ORDER — MOMETASONE FURO-FORMOTEROL FUM 100-5 MCG/ACT IN AERO
2.0000 | INHALATION_SPRAY | Freq: Two times a day (BID) | RESPIRATORY_TRACT | Status: DC
  Administered 2020-03-28 – 2020-03-30 (×5): 2 via RESPIRATORY_TRACT
  Filled 2020-03-27: qty 8.8

## 2020-03-27 MED ORDER — ALBUTEROL SULFATE HFA 108 (90 BASE) MCG/ACT IN AERS
2.0000 | INHALATION_SPRAY | Freq: Once | RESPIRATORY_TRACT | Status: AC
  Administered 2020-03-27: 2 via RESPIRATORY_TRACT
  Filled 2020-03-27: qty 6.7

## 2020-03-27 MED ORDER — IPRATROPIUM-ALBUTEROL 20-100 MCG/ACT IN AERS: 1.0000 | INHALATION_SPRAY | Freq: Four times a day (QID) | RESPIRATORY_TRACT | Status: DC | PRN

## 2020-03-27 MED ORDER — TRAZODONE HCL 50 MG PO TABS
200.0000 mg | ORAL_TABLET | Freq: Every day | ORAL | Status: DC
  Administered 2020-03-28 – 2020-03-29 (×3): 200 mg via ORAL
  Filled 2020-03-27 (×3): qty 4

## 2020-03-27 MED ORDER — SACUBITRIL-VALSARTAN 97-103 MG PO TABS
1.0000 | ORAL_TABLET | Freq: Two times a day (BID) | ORAL | Status: DC
  Administered 2020-03-28 – 2020-03-30 (×7): 1 via ORAL
  Filled 2020-03-27 (×9): qty 1

## 2020-03-27 MED ORDER — CARVEDILOL 12.5 MG PO TABS
50.0000 mg | ORAL_TABLET | Freq: Two times a day (BID) | ORAL | Status: DC
  Administered 2020-03-28 – 2020-03-30 (×6): 50 mg via ORAL
  Filled 2020-03-27 (×6): qty 4

## 2020-03-27 MED ORDER — NITROGLYCERIN 0.4 MG SL SUBL
0.4000 mg | SUBLINGUAL_TABLET | Freq: Once | SUBLINGUAL | Status: AC
  Administered 2020-03-27: 0.4 mg via SUBLINGUAL
  Filled 2020-03-27: qty 1

## 2020-03-27 MED ORDER — NITROGLYCERIN IN D5W 200-5 MCG/ML-% IV SOLN
5.0000 ug/min | INTRAVENOUS | Status: DC
  Administered 2020-03-27: 5 ug/min via INTRAVENOUS
  Filled 2020-03-27: qty 250

## 2020-03-27 MED ORDER — ALBUTEROL SULFATE HFA 108 (90 BASE) MCG/ACT IN AERS
1.0000 | INHALATION_SPRAY | Freq: Four times a day (QID) | RESPIRATORY_TRACT | Status: DC | PRN
  Administered 2020-03-28: 1 via RESPIRATORY_TRACT

## 2020-03-27 MED ORDER — METOPROLOL TARTRATE 5 MG/5ML IV SOLN
5.0000 mg | Freq: Once | INTRAVENOUS | Status: AC
  Administered 2020-03-27: 5 mg via INTRAVENOUS
  Filled 2020-03-27: qty 5

## 2020-03-27 MED ORDER — ARIPIPRAZOLE 10 MG PO TABS
10.0000 mg | ORAL_TABLET | Freq: Every day | ORAL | Status: DC
  Administered 2020-03-28 – 2020-03-30 (×3): 10 mg via ORAL
  Filled 2020-03-27: qty 2
  Filled 2020-03-27 (×2): qty 1

## 2020-03-27 MED ORDER — CLONAZEPAM 0.5 MG PO TABS
0.5000 mg | ORAL_TABLET | Freq: Two times a day (BID) | ORAL | Status: DC
  Administered 2020-03-28 – 2020-03-30 (×6): 0.5 mg via ORAL
  Filled 2020-03-27 (×6): qty 1

## 2020-03-27 MED ORDER — ONDANSETRON 4 MG PO TBDP: 8.0000 mg | ORAL_TABLET | Freq: Three times a day (TID) | ORAL | Status: DC | PRN

## 2020-03-27 MED ORDER — AEROCHAMBER PLUS FLO-VU SMALL MISC
1.0000 | Freq: Once | Status: AC
  Administered 2020-03-27: 1

## 2020-03-27 MED ORDER — ONDANSETRON HCL 4 MG/2ML IJ SOLN
4.0000 mg | Freq: Once | INTRAMUSCULAR | Status: AC
  Administered 2020-03-27: 4 mg via INTRAVENOUS
  Filled 2020-03-27: qty 2

## 2020-03-27 MED ORDER — LABETALOL HCL 5 MG/ML IV SOLN: 20.0000 mg | Freq: Once | INTRAVENOUS | Status: DC

## 2020-03-27 NOTE — ED Provider Notes (Signed)
[facility] EMERGENCY DEPARTMENT Provider Note   CSN: [id number] Arrival date & time: 03/27/20  1641     History No chief complaint on file.   Chelsey Santiago is a 44 y.o. female with history of CHF who presents in respiratory distress with concern of cough x 3 days, now with SOB, rapid heart rate, and chest tightness.   Patient states she was seen 2 weeks ago in the emergency department for similar symptoms, improved but not completely resolved.  Last 3 days she has been having progressively worsening shortness of breath now with rapid heart rate and central chest pain, as well as persistent productive cough, yellow to clear mucus.    Unable to provide much more history, as she is in respiratory distress, she is tearful, tachypneic, tachycardic, extremely hypertensive on the monitor.  I personally reviewed this patient's medical records.  She has history of hypertension, uncontrolled systolic and diastolic CHF, morbid obesity, MDD, diabetes type 2, NSTEMI, migraines.  Most recent cardiac echocardiogram was in 10/2018.  LVEF at that time was 45%, severe concentric LV hypertrophy; thickening of the aortic valve.    HPI     Past Medical History:  Diagnosis Date  . Anemia    H&H of 10.6/33 and 07/2008 and 11.9/35 and 09/2010  . Anxiety   . Chronic combined systolic and diastolic CHF (congestive heart failure) ([place])   . Depression with anxiety   . Diabetes mellitus without complication ([place])   . Hypertension   . Hypertensive heart disease 2009   Pulmonary edema postpartum; mild to moderate mitral regurgitation when hospitalized for CHF in 2009; Echocardiogram in 12/2009-no MR and normal EF; normal CXR in 09/2010  . Migraine headache   . Miscarriage 03/19/2013  . Obesity 04/16/2009  . Osteoarthritis, knee 03/29/2011  . Preeclampsia   . Pulmonary edema   . Sleep apnea   . Threatened abortion in early pregnancy 03/15/2013    Patient Active Problem List   Diagnosis Date Noted  .  Flash pulmonary edema ([place]) 03/27/2020  . Back pain 12/19/2019  . Vaginal discharge 12/19/2019  . Vaginal itching 12/19/2019  . BV (bacterial vaginosis) 12/19/2019  . Chronic hypertension 12/19/2019  . Fibroids 09/26/2019  . S/P hysterectomy 09/26/2019  . Acute blood loss anemia 09/26/2019  . Abdominal pain, epigastric 03/14/2019  . Dysphagia 03/14/2019  . Gastroesophageal reflux disease 03/14/2019  . Class 2 obesity   . Acute exacerbation of CHF (congestive heart failure) ([place]) 10/26/2018  . Chronic combined systolic and diastolic CHF (congestive heart failure) ([place]) 06/02/2018  . Uncontrolled type 2 diabetes mellitus with hyperglycemia ([place]) 06/02/2018  . Influenza B 05/13/2018  . Hypomagnesemia 05/12/2018  . Headache 05/12/2018  . Upper respiratory tract infection   . HCAP (healthcare-associated pneumonia)   . Constipation 10/17/2017  . Bad headache   . CHF exacerbation ([place]) 09/29/2017  . Iron deficiency anemia 05/16/2017  . Vitamin D deficiency 11/25/2016  . Iron deficiency 11/25/2016  . History of acute myocardial infarction 10/05/2016  . CHF (congestive heart failure) ([place]) 05/06/2016  . Diabetes mellitus with complication ([place]) 47/01/6282  . Leukocytosis 05/06/2016  . Neuropathy 05/06/2016  . Depression 04/16/2016  . Chronic tension-type headache, not intractable 04/16/2016  . AKI (acute kidney injury) ([place])   . Hyperkalemia   . Nonischemic cardiomyopathy ([place])   . Acute on chronic combined systolic and diastolic ACC/AHA stage C congestive heart failure ([place]) 04/03/2016  . Acute on chronic combined systolic and diastolic CHF, NYHA class 4 ([place]) 04/03/2016  .  Hypertensive emergency 04/03/2016  . Cardiomyopathy due to hypertension ([place]) 12/22/2015  . Normal coronary arteries 12/22/2015  . Troponin level elevated 12/22/2015  . NSTEMI (non-ST elevated myocardial infarction) ([place]) 12/20/2015  . Dental infection 10/10/2015  . Chest pain 09/05/2015  . Systolic CHF,  chronic ([place]) 09/05/2015  . LLQ pain   . Type 2 diabetes mellitus without complication ([place]) 70/05/7492  . Essential hypertension   . 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 Hypertensive urgency 04/22/2014  . Acute CHF ([place]) 04/22/2014  . S/P cesarean section 04/11/2014  . Acute pulmonary edema ([place]) 04/11/2014  . Postoperative anemia 04/11/2014  . Elevated serum creatinine 04/11/2014  . Preeclampsia, severe 04/09/2014  . Pre-eclampsia superimposed on chronic hypertension, antepartum 04/08/2014  . Dyspnea 04/08/2014  . Polyhydramnios in third trimester, antepartum 03/14/2014  . Abnormal maternal glucose tolerance, antepartum 03/11/2014  . High-risk pregnancy 03/11/2014  . Pre-existing essential hypertension complicating pregnancy 49/67/5916  . Impaired glucose tolerance during pregnancy, antepartum 11/27/2013  . Leiomyoma of uterus 11/22/2013  . History of gestational diabetes in prior pregnancy, currently pregnant in first trimester 11/22/2013  . Hx of preeclampsia, prior pregnancy, currently pregnant 11/22/2013  . Short interval between pregnancies affecting pregnancy, antepartum 11/22/2013  . Supervision of high-risk pregnancy of elderly primigravida (>= 9 years old at delivery), third trimester 11/22/2013  . Miscarriage 03/19/2013  . Major depressive disorder, single episode, unspecified 09/27/2011  . Hypertension   . Hypertensive cardiovascular disease   . Microcytic anemia   . Osteoarthrosis involving lower leg 03/29/2011  . Hypokalemia 12/12/2009  . OSA on CPAP 12/09/2009  . Morbid obesity ([place]) 04/16/2009    Past Surgical History:  Procedure Laterality Date  . BREAST REDUCTION SURGERY  2002  . CARDIAC CATHETERIZATION N/A 12/22/2015   Procedure: Left Heart Cath and Coronary Angiography;  Surgeon: Peter M Martinique, MD;  Location: [facility];  Service: Cardiovascular;  Laterality: N/A;  . CESAREAN SECTION N/A 04/09/2014   Procedure: CESAREAN SECTION;  Surgeon: Mora Bellman, MD;  Location: [facility];  Service: Obstetrics;  Laterality: N/A;  . CHOLECYSTECTOMY    . HYSTERECTOMY ABDOMINAL WITH SALPINGECTOMY N/A 09/26/2019   Procedure: HYSTERECTOMY ABDOMINAL WITH SALPINGECTOMY;  Surgeon: Florian Buff, MD;  Location: [facility];  Service: Gynecology;  Laterality: N/A;  . LIPOMA EXCISION Right 09/26/2019   Procedure: EXCISION LIPOMA RIGHT VULVAR;  Surgeon: Florian Buff, MD;  Location: [facility];  Service: Gynecology;  Laterality: Right;     OB History    Gravida  11   Para  6   Term  5   Preterm  1   AB  5   Living  6     SAB  3   TAB  2   Ectopic      Multiple  0   Live Births  6           Family History  Problem Relation Age of Onset  . Diabetes Mother 38  . Heart disease Mother   . Hyperlipidemia Paternal Grandfather   . Hypertension Paternal Grandfather   . Heart disease Father   . Hypertension Father   . Heart disease Maternal Grandmother 60  . ADD / ADHD Son   . Hypertension Maternal Uncle   . Heart attack Brother   . Sudden death Neg Hx   . Colon cancer Neg Hx   . Celiac disease Neg Hx   . Inflammatory bowel disease Neg Hx     Social History   Tobacco Use  .  Smoking status: Never Smoker  . Smokeless tobacco: Never Used  Vaping Use  . Vaping Use: Never used  Substance Use Topics  . Alcohol use: Yes    Comment: occ  . Drug use: No    Home Medications Prior to Admission medications   Medication Sig Start Date End Date Taking? Authorizing Provider  albuterol (PROVENTIL HFA;VENTOLIN HFA) 108 (90 Base) MCG/ACT inhaler Inhale 1 puff into the lungs every 6 (six) hours as needed for wheezing or shortness of breath.    [provider]  amLODipine (NORVASC) 10 MG tablet Take 1 tablet (10 mg total) by mouth daily. 09/04/19 12/19/19  Strader, Fransisco Hertz, PA-C  ARIPiprazole (ABILIFY) 10 MG tablet Take 1 tablet (10 mg total) by mouth daily. 10/30/18   Barton Dubois, MD  aspirin EC 81 MG tablet Take 81 mg by mouth daily.     [provider]  budesonide-formoterol (SYMBICORT) 80-4.5 MCG/ACT inhaler Inhale 2 puffs into the lungs 2 (two) times daily. 05/04/18   Manuella Ghazi, Pratik D, DO  carvedilol (COREG) 25 MG tablet Take 2 tablets (50 mg total) by mouth 2 (two) times daily with a meal. 01/23/20   Branch, Alphonse Guild, MD  clonazePAM (KLONOPIN) 0.5 MG tablet Take 0.5 mg by mouth 2 (two) times daily. 12/19/18   [provider]  fluconazole (DIFLUCAN) 150 MG tablet Take 1 now and 1 in 3 days 12/19/19   Derrek Monaco A, NP  gabapentin (NEURONTIN) 600 MG tablet Take 600 mg by mouth 3 (three) times daily.     [provider]  hydrALAZINE (APRESOLINE) 100 MG tablet Take 1 tablet (100 mg total) by mouth 3 (three) times daily. 01/23/20   Arnoldo Lenis, MD  ibuprofen (ADVIL) 800 MG tablet Take 1 tablet (800 mg total) by mouth every 6 (six) hours. 09/28/19   Florian Buff, MD  insulin glargine (LANTUS) 100 UNIT/ML injection Inject 30 Units at bedtime into the skin.     [provider]  insulin lispro (HUMALOG) 100 UNIT/ML injection Inject 15 Units into the skin 3 (three) times daily before meals.     [provider]  Ipratropium-Albuterol (COMBIVENT RESPIMAT) 20-100 MCG/ACT AERS respimat Inhale 1 puff into the lungs every 6 (six) hours as needed for wheezing or shortness of breath. 05/04/18   Manuella Ghazi, Pratik D, DO  linaclotide Rolan Lipa) 145 MCG CAPS capsule Take 1 capsule (145 mcg total) by mouth daily before breakfast. 03/14/19   Erenest Rasher, PA-C  metroNIDAZOLE (FLAGYL) 500 MG tablet Take 1 tablet (500 mg total) by mouth 2 (two) times daily. 12/19/19   Estill Dooms, NP  nitroGLYCERIN (NITROSTAT) 0.4 MG SL tablet Place 1 tablet (0.4 mg total) under the tongue every 5 (five) minutes as needed for chest pain. 09/05/15   Kathie Dike, MD  ondansetron (ZOFRAN ODT) 8 MG disintegrating tablet Take 1 tablet (8 mg total) by mouth every 8 (eight) hours as needed for nausea or vomiting.  09/06/19   Long, Wonda Olds, MD  potassium chloride SA (KLOR-CON M20) 20 MEQ tablet Take 2 tablets (40 mEq total) by mouth daily. 09/04/19 12/19/19  Strader, Fransisco Hertz, PA-C  potassium chloride SA (KLOR-CON) 20 MEQ tablet Take 1 tablet (20 mEq total) by mouth 2 (two) times daily. 03/12/20   Fredia Sorrow, MD  promethazine-codeine (PHENERGAN WITH CODEINE) 6.25-10 MG/5ML syrup Take 5 mLs by mouth every 4 (four) hours as needed for cough. 02/06/20   Evalee Jefferson, PA-C  pyridostigmine (MESTINON) 60 MG tablet Take  60 mg by mouth every 8 (eight) hours.    [provider]  sacubitril-valsartan (ENTRESTO) 97-103 MG Take 1 tablet by mouth 2 (two) times daily. 03/16/19   Arnoldo Lenis, MD  torsemide (DEMADEX) 20 MG tablet Take 2 tablets (40 mg total) by mouth daily. 09/04/19 12/19/19  Strader, Fransisco Hertz, PA-C  traZODone (DESYREL) 100 MG tablet Take 2 tablets (200 mg total) by mouth at bedtime. 10/29/18   Barton Dubois, MD  triamcinolone ointment (KENALOG) 0.5 % Apply 1 application topically 2 (two) times daily. 12/19/19   Estill Dooms, NP    Allergies    Diclofenac, Tramadol, and Vicodin [hydrocodone-acetaminophen]  Review of Systems   Review of Systems  Unable to perform ROS: Severe respiratory distress (Review of symptoms obtained after patient was stabilized.)  Constitutional: Negative for activity change, appetite change, chills, diaphoresis, fatigue and fever.  HENT: Negative.   Respiratory: Positive for cough, chest tightness and shortness of breath.   Cardiovascular: Positive for chest pain and palpitations. Negative for leg swelling.  Gastrointestinal: Positive for nausea. Negative for abdominal pain, diarrhea and vomiting.  Genitourinary: Negative.   Musculoskeletal: Negative.   Skin: Negative.   Allergic/Immunologic: Negative.   Neurological: Negative for syncope, weakness, light-headedness, numbness and headaches.  Hematological: Negative.   Psychiatric/Behavioral: The  patient is nervous/anxious.     Physical Exam Updated Vital Signs BP (!) 172/111   Pulse 96   Temp 98.5 F (36.9 C) (Oral)   Resp (!) 40   Ht 5\' 6"  (1.676 m)   Wt 102.1 kg   LMP 08/21/2019   SpO2 99%   BMI 36.32 kg/m   Physical Exam Vitals and nursing note reviewed.  Constitutional:      General: She is in acute distress.     Appearance: She is obese. She is ill-appearing.  HENT:     Head: Normocephalic and atraumatic.     Nose: Nose normal.     Mouth/Throat:     Mouth: Mucous membranes are moist.     Pharynx: Oropharynx is clear. No oropharyngeal exudate or posterior oropharyngeal erythema.  Eyes:     General:        Right eye: No discharge.        Left eye: No discharge.     Conjunctiva/sclera: Conjunctivae normal.     Pupils: Pupils are equal, round, and reactive to light.  Cardiovascular:     Rate and Rhythm: Regular rhythm. Tachycardia present.     Pulses: Normal pulses.          Radial pulses are 2+ on the right side and 2+ on the left side.       Dorsalis pedis pulses are 2+ on the right side and 2+ on the left side.     Heart sounds: Normal heart sounds. No murmur heard.   Pulmonary:     Effort: Respiratory distress present.     Breath sounds: Examination of the right-middle field reveals wheezing and rales. Examination of the left-middle field reveals wheezing. Examination of the right-lower field reveals wheezing and rales. Examination of the left-lower field reveals wheezing and rales. Wheezing and rales present.  Chest:     Chest wall: Tenderness present. No deformity, crepitus or edema.    Abdominal:     General: There is no distension.     Palpations: Abdomen is soft.     Tenderness: There is no abdominal tenderness. There is no guarding or rebound.  Musculoskeletal:  General: No deformity.     Cervical back: Neck supple. No rigidity or tenderness.     Right lower leg: No edema.     Left lower leg: No edema.  Lymphadenopathy:     Cervical:  No cervical adenopathy.  Skin:    General: Skin is warm and dry.     Capillary Refill: Capillary refill takes less than 2 seconds.  Neurological:     Mental Status: She is alert and oriented to person, place, and time. Mental status is at baseline.  Psychiatric:        Mood and Affect: Mood is anxious.     ED Results / Procedures / Treatments   Labs (all labs ordered are listed, but only abnormal results are displayed) Labs Reviewed  COMPREHENSIVE METABOLIC PANEL - Abnormal; Notable for the following components:      Result Value   Sodium 131 (*)    Glucose, Bld 245 (*)    Creatinine, Ser 1.05 (*)    Calcium 8.5 (*)    Albumin 3.3 (*)    All other components within normal limits  CBC WITH DIFFERENTIAL/PLATELET - Abnormal; Notable for the following components:   WBC 11.0 (*)    RBC 2.33 (*)    Hemoglobin 5.8 (*)    HCT 18.0 (*)    MCV 77.3 (*)    MCH 24.9 (*)    Neutro Abs 8.3 (*)    All other components within normal limits  BRAIN NATRIURETIC PEPTIDE - Abnormal; Notable for the following components:   B Natriuretic Peptide 444.0 (*)    All other components within normal limits  IRON AND TIBC - Abnormal; Notable for the following components:   Saturation Ratios 9 (*)    All other components within normal limits  HEMOGLOBIN AND HEMATOCRIT, BLOOD - Abnormal; Notable for the following components:   Hemoglobin 10.5 (*)    HCT 34.5 (*)    All other components within normal limits  TROPONIN I (HIGH SENSITIVITY) - Abnormal; Notable for the following components:   Troponin I (High Sensitivity) 25 (*)    All other components within normal limits  RESPIRATORY PANEL BY RT PCR (FLU A&B, COVID)  VITAMIN B12  FOLATE  FERRITIN  RETICULOCYTES  POC OCCULT BLOOD, ED  TYPE AND SCREEN  PREPARE RBC (CROSSMATCH)    EKG EKG Interpretation  Date/Time:  Thursday March 27 2020 23:23:50 EST Ventricular Rate:  105 PR Interval:    QRS Duration: 100 QT Interval:  380 QTC  Calculation: 503 R Axis:   10 Text Interpretation: Sinus tachycardia Abnormal R-wave progression, late transition Probable left ventricular hypertrophy Nonspecific T abnormalities, lateral leads Borderline prolonged QT interval Confirmed by Ripley Fraise [phone]) on 03/27/2020 11:36:36 PM   Radiology DG Chest Portable 1 View  Result Date: 03/27/2020 CLINICAL DATA:  43 year old female with shortness of breath. EXAM: PORTABLE CHEST 1 VIEW COMPARISON:  Chest radiograph dated 03/12/2020. FINDINGS: There is cardiomegaly with vascular congestion. No focal consolidation, pleural effusion or pneumothorax. No acute osseous pathology. IMPRESSION: Cardiomegaly with vascular congestion. No focal consolidation. Electronically Signed   By: Anner Crete M.D.   On: 03/27/2020 18:47    Procedures .Critical Care Performed by: Emeline Darling, PA-C Authorized by: Emeline Darling, PA-C   Critical care provider statement:    Critical care time (minutes):  45   Critical care was necessary to treat or prevent imminent or life-threatening deterioration of the following conditions:  Circulatory failure (severely anemic, Hg 5.8)  Critical care was time spent personally by me on the following activities:  Discussions with consultants, evaluation of patient's response to treatment, examination of patient, ordering and performing treatments and interventions, ordering and review of laboratory studies, ordering and review of radiographic studies, pulse oximetry, re-evaluation of patient's condition, obtaining history from patient or surrogate, review of old charts and development of treatment plan with patient or surrogate   (including critical care time)  Medications Ordered in ED Medications  nitroGLYCERIN 50 mg in dextrose 5 % 250 mL (0.2 mg/mL) infusion (155 mcg/min Intravenous Rate/Dose Change 03/28/20 0011)  0.9 %  sodium chloride infusion (Manually program via Guardrails IV Fluids) (has no  administration in time range)  aspirin EC tablet 81 mg (has no administration in time range)  amLODipine (NORVASC) tablet 10 mg (has no administration in time range)  carvedilol (COREG) tablet 50 mg (has no administration in time range)  hydrALAZINE (APRESOLINE) tablet 100 mg (has no administration in time range)  sacubitril-valsartan (ENTRESTO) 97-103 mg per tablet (has no administration in time range)  ARIPiprazole (ABILIFY) tablet 10 mg (has no administration in time range)  traZODone (DESYREL) tablet 200 mg (has no administration in time range)  ondansetron (ZOFRAN-ODT) disintegrating tablet 8 mg (has no administration in time range)  clonazePAM (KLONOPIN) tablet 0.5 mg (has no administration in time range)  gabapentin (NEURONTIN) capsule 600 mg (has no administration in time range)  pyridostigmine (MESTINON) tablet 60 mg (has no administration in time range)  albuterol (VENTOLIN HFA) 108 (90 Base) MCG/ACT inhaler 1 puff (has no administration in time range)  mometasone-formoterol (DULERA) 100-5 MCG/ACT inhaler 2 puff (2 puffs Inhalation Not Given 03/27/20 2351)  Ipratropium-Albuterol (COMBIVENT) respimat 1 puff (has no administration in time range)  nitroGLYCERIN (NITROSTAT) SL tablet 0.4 mg (0.4 mg Sublingual Given 03/27/20 1803)  albuterol (VENTOLIN HFA) 108 (90 Base) MCG/ACT inhaler 2 puff (2 puffs Inhalation Given 03/27/20 1751)  AeroChamber Plus Flo-Vu Small device MISC 1 each (1 each Other Given 03/27/20 1751)  nitroGLYCERIN (NITROSTAT) SL tablet 0.4 mg (0.4 mg Sublingual Given 03/27/20 1841)  nitroGLYCERIN (NITROSTAT) SL tablet 0.4 mg (0.4 mg Sublingual Given 03/27/20 2035)  fentaNYL (SUBLIMAZE) injection 50 mcg (50 mcg Intravenous Given 03/27/20 2244)  ondansetron (ZOFRAN) injection 4 mg (4 mg Intravenous Given 03/27/20 2237)  metoprolol tartrate (LOPRESSOR) injection 5 mg (5 mg Intravenous Given 03/27/20 2343)    ED Course  I have reviewed the triage vital signs and the  nursing notes.  Pertinent labs & imaging results that were available during my care of the patient were reviewed by me and considered in my medical decision making (see chart for details).    MDM Rules/Calculators/A&P                         44 year old female with history of systolic and diastolic CHF, hypertension who presents in acute respiratory distress, tachycardic, tachypneic, hypertensive.  Differential diagnosis for this patient's medical prominent to flash pulmonary edema, CHF exacerbation, PE, ACS.   Patient severely hypertensive on intake to 207/175.  Tachycardic to 108, tachypneic 204.  O2 saturation 100% on room air, however patient placed on 2 L by nasal cannula for comfort.  At time of my initial evaluation patient she has significantly increased work of breathing, tachypnea, blood pressure >200/>100.  Patient unable to verbally communicate HPI or ROS to me, due to respiratory distress.  Physical exam significant for rales in the bases, wheezing in the middle and lower  lung fields bilaterally.  Albuterol ordered.   Discussed case with attending physician, who also saw the patient.  Sublingual nitroglycerin ordered for chest pain, hypertension.  CBC, CMP, troponin, BNP, chest x-ray, EKG, respiratory pathogen panel ordered, pending.  Patient reevaluated, continues to be hypertensive with chest pain despite sublingual nitro.  Will reorder second nitro tablet.  Per attending recommendation, BiPAP ordered.  Chest x-ray with cardiomegaly with vascular congestion. No focal consolidation.  EKG sinus tachycardia, borderline prolonged QT, nonspecific T wave abnormalities, LVH.  No STEMI.  Respiratory pathogen panel negative for COVID-19, influenza A/B.  At time of my reevaluation of the patient, she is recently placed on BiPAP by respiratory therapist.  (Patient is tearful, stating that her family does not care that she is sick, does not feel appreciated.  She is she feels  scared, lonely, states that her mother died from CHF and she is fearful that she will die as well.) She remains significantly hypertensive to 238/147 on the monitor while I am in the room.  Oxygen saturation remains 100%.  Patient is complaining of worsening central chest pain, radiates to her left shoulder.  Repeat sublingual nitroglycerin tablet ordered, nitro drip ordered.  Critical lab result, hemoglobin critically low at 5.8.  Type and crossmatch ordered, transfusion informed consent obtained verbally from the patient.  She states she has had 2 blood transfusions in the past, once after the birth of her daughter and the second after her abdominal hysterectomy.  She denies blood per rectum, denies dark tarry stools.  CBC with leukocytosis 11.0.  BNP elevated at 444.  CMP with hyponatremia 131, AKI creatinine 1.05.  Fecal occult blood negative.  Initial troponin 25, delta troponin pending.  Anemia panel pending.  Patient continues to be on BiPap, maintaining oxygen saturations.  Received phone call from the lab, who repeated an H&H as part of the type and crossmatch: At the time they found her hemoglobin to be 10.5.  They repeated the test, with confirmation of hemoglobin of 10.5, it is likely that the initial sample resulted 5.8 was diluted with saline.  Blood transfusion canceled, patient informed.   Patient concern for headache on nitro drip.  Analgesia offered, antiemetic.  Patient remains borderline tachycardic, blood pressure has improved, however still significantly elevated, patient remains tachypneic on BiPAP.  She is stable at this time, will consult hospitalist for admission.  Hospitalist Dr.Zierle-Ghosh consulted, she is agreeable to seeing the patient in the emergency department and admitting the patient to her service.  I appreciate her collaboration in the care of this patient.  Harmony Sandell was evaluated in Emergency Department on 03/28/2020 for the symptoms  described in the history of present illness. She was evaluated in the context of the global COVID-19 pandemic, which necessitated consideration that the patient might be at risk for infection with the SARS-CoV-2 virus that causes COVID-19. Institutional protocols and algorithms that pertain to the evaluation of patients at risk for COVID-19 are in a state of rapid change based on information released by regulatory bodies including the CDC and federal and state organizations. These policies and algorithms were followed during the patient's care in the ED.  Final Clinical Impression(s) / ED Diagnoses Final diagnoses:  None    Rx / DC Orders ED Discharge Orders    None       Aura Dials 03/28/20 0019    Margette Fast, MD 03/28/20 [phone]

## 2020-03-27 NOTE — ED Notes (Signed)
Date and time results received: 03/27/20 8:30 PM  (use smartphrase ".now" to insert current time)  Test: 5.8 Critical Value: Hem  Name of Provider Notified: Dr. Laverta Baltimore  Orders Received? Or Actions Taken?:N/a

## 2020-03-27 NOTE — ED Triage Notes (Signed)
Cough for 3 days, history of CHF, HAS NOT TAKEN ALL HER PRESCRIBED MEDICATION TODAY

## 2020-03-28 ENCOUNTER — Inpatient Hospital Stay (HOSPITAL_COMMUNITY): Payer: Medicaid Other

## 2020-03-28 DIAGNOSIS — G4733 Obstructive sleep apnea (adult) (pediatric): Secondary | ICD-10-CM

## 2020-03-28 DIAGNOSIS — J810 Acute pulmonary edema: Secondary | ICD-10-CM

## 2020-03-28 DIAGNOSIS — E1169 Type 2 diabetes mellitus with other specified complication: Secondary | ICD-10-CM

## 2020-03-28 DIAGNOSIS — I161 Hypertensive emergency: Secondary | ICD-10-CM

## 2020-03-28 DIAGNOSIS — I5043 Acute on chronic combined systolic (congestive) and diastolic (congestive) heart failure: Secondary | ICD-10-CM | POA: Diagnosis not present

## 2020-03-28 LAB — TSH: TSH: 0.354 u[IU]/mL (ref 0.350–4.500)

## 2020-03-28 LAB — COMPREHENSIVE METABOLIC PANEL
ALT: 12 U/L (ref 0–44)
AST: 15 U/L (ref 15–41)
Albumin: 2.9 g/dL — ABNORMAL LOW (ref 3.5–5.0)
Alkaline Phosphatase: 62 U/L (ref 38–126)
Anion gap: 7 (ref 5–15)
BUN: 15 mg/dL (ref 6–20)
CO2: 26 mmol/L (ref 22–32)
Calcium: 8.1 mg/dL — ABNORMAL LOW (ref 8.9–10.3)
Chloride: 99 mmol/L (ref 98–111)
Creatinine, Ser: 1.12 mg/dL — ABNORMAL HIGH (ref 0.44–1.00)
GFR, Estimated: >60 mL/min (ref >60)
Glucose, Bld: 272 mg/dL — ABNORMAL HIGH (ref 70–99)
Potassium: 3.9 mmol/L (ref 3.5–5.1)
Sodium: 132 mmol/L — ABNORMAL LOW (ref 135–145)
Total Bilirubin: 0.8 mg/dL (ref 0.3–1.2)
Total Protein: 6.6 g/dL (ref 6.5–8.1)

## 2020-03-28 LAB — CBG MONITORING, ED
Glucose-Capillary: 235 mg/dL — ABNORMAL HIGH (ref 70–99)
Glucose-Capillary: 211 mg/dL — ABNORMAL HIGH (ref 70–99)
Glucose-Capillary: 226 mg/dL — ABNORMAL HIGH (ref 70–99)
Glucose-Capillary: 250 mg/dL — ABNORMAL HIGH (ref 70–99)
Glucose-Capillary: 222 mg/dL — ABNORMAL HIGH (ref 70–99)
Glucose-Capillary: 234 mg/dL — ABNORMAL HIGH (ref 70–99)

## 2020-03-28 LAB — ECHOCARDIOGRAM COMPLETE
Area-P 1/2: 3.81 cm2
Height: 66 in
S' Lateral: 4.55 cm
Weight: 3600 oz

## 2020-03-28 LAB — CBC WITH DIFFERENTIAL/PLATELET
Abs Immature Granulocytes: 0.04 10*3/uL (ref 0.00–0.07)
Basophils Absolute: 0 10*3/uL (ref 0.0–0.1)
Basophils Relative: 0 %
Eosinophils Absolute: 0.1 10*3/uL (ref 0.0–0.5)
Eosinophils Relative: 1 %
HCT: 32.6 % — ABNORMAL LOW (ref 36.0–46.0)
Hemoglobin: 10 g/dL — ABNORMAL LOW (ref 12.0–15.0)
Immature Granulocytes: 0 %
Lymphocytes Relative: 16 %
Lymphs Abs: 1.6 10*3/uL (ref 0.7–4.0)
MCH: 23.6 pg — ABNORMAL LOW (ref 26.0–34.0)
MCHC: 30.7 g/dL (ref 30.0–36.0)
MCV: 76.9 fL — ABNORMAL LOW (ref 80.0–100.0)
Monocytes Absolute: 0.7 10*3/uL (ref 0.1–1.0)
Monocytes Relative: 7 %
Neutro Abs: 7.7 10*3/uL (ref 1.7–7.7)
Neutrophils Relative %: 76 %
Platelets: 221 10*3/uL (ref 150–400)
RBC: 4.24 MIL/uL (ref 3.87–5.11)
RDW: 14.8 % (ref 11.5–15.5)
WBC: 10.1 10*3/uL (ref 4.0–10.5)
nRBC: 0 % (ref 0.0–0.2)

## 2020-03-28 LAB — MAGNESIUM: Magnesium: 1.7 mg/dL (ref 1.7–2.4)

## 2020-03-28 LAB — TROPONIN I (HIGH SENSITIVITY): Troponin I (High Sensitivity): 23 ng/L — ABNORMAL HIGH (ref <18)

## 2020-03-28 LAB — HEMOGLOBIN A1C
Hgb A1c MFr Bld: 10.6 % — ABNORMAL HIGH (ref 4.8–5.6)
Mean Plasma Glucose: 257.52 mg/dL

## 2020-03-28 MED ORDER — KETOROLAC TROMETHAMINE 15 MG/ML IJ SOLN
15.0000 mg | Freq: Four times a day (QID) | INTRAMUSCULAR | Status: DC | PRN
  Administered 2020-03-28: 15 mg via INTRAVENOUS
  Filled 2020-03-28: qty 1

## 2020-03-28 MED ORDER — ACETAMINOPHEN 650 MG RE SUPP: 650.0000 mg | Freq: Four times a day (QID) | RECTAL | Status: DC | PRN

## 2020-03-28 MED ORDER — FUROSEMIDE 10 MG/ML IJ SOLN
40.0000 mg | Freq: Two times a day (BID) | INTRAMUSCULAR | Status: DC
  Administered 2020-03-28 – 2020-03-30 (×5): 40 mg via INTRAVENOUS
  Filled 2020-03-28 (×5): qty 4

## 2020-03-28 MED ORDER — INSULIN ASPART 100 UNIT/ML ~~LOC~~ SOLN
0.0000 [IU] | Freq: Every day | SUBCUTANEOUS | Status: DC
  Administered 2020-03-28 – 2020-03-29 (×3): 2 [IU] via SUBCUTANEOUS
  Filled 2020-03-28 (×3): qty 1

## 2020-03-28 MED ORDER — DM-GUAIFENESIN ER 30-600 MG PO TB12
1.0000 | ORAL_TABLET | Freq: Two times a day (BID) | ORAL | Status: DC
  Administered 2020-03-28 – 2020-03-30 (×5): 1 via ORAL
  Filled 2020-03-28 (×5): qty 1

## 2020-03-28 MED ORDER — HEPARIN SODIUM (PORCINE) 5000 UNIT/ML IJ SOLN
5000.0000 [IU] | Freq: Three times a day (TID) | INTRAMUSCULAR | Status: DC
  Administered 2020-03-28 – 2020-03-29 (×3): 5000 [IU] via SUBCUTANEOUS
  Filled 2020-03-28 (×4): qty 1

## 2020-03-28 MED ORDER — POLYETHYLENE GLYCOL 3350 17 G PO PACK: 17.0000 g | PACK | Freq: Every day | ORAL | Status: DC | PRN

## 2020-03-28 MED ORDER — INSULIN GLARGINE 100 UNIT/ML ~~LOC~~ SOLN
30.0000 [IU] | Freq: Every day | SUBCUTANEOUS | Status: DC
  Administered 2020-03-28 – 2020-03-29 (×3): 30 [IU] via SUBCUTANEOUS
  Filled 2020-03-28 (×4): qty 0.3

## 2020-03-28 MED ORDER — ACETAMINOPHEN 325 MG PO TABS: 650.0000 mg | ORAL_TABLET | Freq: Four times a day (QID) | ORAL | Status: DC | PRN

## 2020-03-28 MED ORDER — ONDANSETRON HCL 4 MG/2ML IJ SOLN: 4.0000 mg | Freq: Four times a day (QID) | INTRAMUSCULAR | Status: DC | PRN

## 2020-03-28 MED ORDER — MORPHINE SULFATE (PF) 2 MG/ML IV SOLN
2.0000 mg | INTRAVENOUS | Status: DC | PRN
  Administered 2020-03-28: 2 mg via INTRAVENOUS
  Filled 2020-03-28: qty 1

## 2020-03-28 MED ORDER — INSULIN ASPART 100 UNIT/ML ~~LOC~~ SOLN
4.0000 [IU] | Freq: Three times a day (TID) | SUBCUTANEOUS | Status: DC
  Administered 2020-03-28 – 2020-03-30 (×8): 4 [IU] via SUBCUTANEOUS
  Filled 2020-03-28 (×2): qty 1

## 2020-03-28 MED ORDER — DIPHENHYDRAMINE HCL 25 MG PO CAPS: 25.0000 mg | ORAL_CAPSULE | Freq: Four times a day (QID) | ORAL | Status: DC | PRN

## 2020-03-28 MED ORDER — INSULIN ASPART 100 UNIT/ML ~~LOC~~ SOLN
0.0000 [IU] | Freq: Three times a day (TID) | SUBCUTANEOUS | Status: DC
  Administered 2020-03-28 – 2020-03-29 (×4): 7 [IU] via SUBCUTANEOUS
  Administered 2020-03-29 (×2): 3 [IU] via SUBCUTANEOUS
  Administered 2020-03-30: 20 [IU] via SUBCUTANEOUS
  Administered 2020-03-30: 11 [IU] via SUBCUTANEOUS
  Filled 2020-03-28 (×3): qty 1

## 2020-03-28 MED ORDER — ONDANSETRON HCL 4 MG PO TABS: 4.0000 mg | ORAL_TABLET | Freq: Four times a day (QID) | ORAL | Status: DC | PRN

## 2020-03-28 NOTE — Consult Note (Addendum)
Cardiology Consult    Patient ID: Rema Lievanos; [id number]; May 03, 1976   Admit date: 03/27/2020 Date of Consult: 03/28/2020  Primary Care Provider: Rosita Fire, MD Primary Cardiologist: Carlyle Dolly, MD   Patient Profile    Chelsey Santiago is a 44 y.o. female with past medical history of chronic combined systolic and diastolic CHF/NICM(EF 35-70% by echo in 12/2015 with cath showing normal cors, EF at 45% by repeat echo in 10/2018), HTN,IDDMand OSA (not currently on CPAP) who is being seen today for the evaluation of CHF at the request of Dr. Roderic Palau   History of Present Illness    Chelsey Santiago was last examined by myself in 08/2019 and denied any recent chest pain or palpitations at that time. BP was significantly elevated at 160/115 and she was continued on Coreg 50 mg twice daily, Entresto 97-103 mg twice daily and Torsemide 40 mg daily with Amlodipine being titrated from 5 mg daily to 10 mg daily. She had requested preoperative cardiac clearance for hysterectomy but this was postponed until BP had improed. BP was improved at 122/68 at the time of her nurse visit on 09/06/2019 and she was cleared to proceed with planned hysterectomy. She had scheduled follow-up visits in 10/2019 and 02/2020 but did not show up to these.   She presented to [facility] ED on 03/27/2020 for evaluation of worsening dyspnea and coughing for the past 3 days. BP was significantly elevated to 215/148 while in the ED.  Initial labs showed WBC 11.0, Hgb 5.8 with repeat at 10.5 and initial reading felt to be a lab error, platelets 259, Na+ 131, K+ 3.8 and creatinine 1.05. LFT's WNL. BNP 444. Initial HS Troponin 25 with repeat of 23. COVID negative. CXR showed cardiomegaly with vascular congestion. EKG shows sinus tachycardia, HR 111 with nonspecific ST abnormalities along the lateral leads.   Initially required BiPAP but now transitioned to nasal cannula. She has been started on IV Lasix 40mg  BID. Initially  on IV NTG due to hypertensive emergency but this has now been stopped. She has been restarted on Amlodipine 10mg  daily, Coreg 50mg  BID, Hydralazine 100mg  TID and Entresto 97-103mg  BID.   In talking with the patient, she reports having dyspnea on exertion and a productive cough since her recent ED visit on 03/12/2020. Starting yesterday, she felt more short of breath at rest which prompted her to come to the ED. She reports associated chest pressure at that time. No recent orthopnea or PND. She does report increased edema along her thighs. She has a cuff at home but it is broken and she has not checked her BP regularly. She reports compliance with her medications but feels like home stress (has 4 kids at home) was likely contributing to her elevated readings.    Past Medical History:  Diagnosis Date  . Anemia    H&H of 10.6/33 and 07/2008 and 11.9/35 and 09/2010  . Anxiety   . Chronic combined systolic and diastolic CHF (congestive heart failure) ([place])   . Depression with anxiety   . Diabetes mellitus without complication ([place])   . Hypertension   . Hypertensive heart disease 2009   Pulmonary edema postpartum; mild to moderate mitral regurgitation when hospitalized for CHF in 2009; Echocardiogram in 12/2009-no MR and normal EF; normal CXR in 09/2010  . Migraine headache   . Miscarriage 03/19/2013  . Obesity 04/16/2009  . Osteoarthritis, knee 03/29/2011  . Preeclampsia   . Pulmonary edema   . Sleep apnea   .  Threatened abortion in early pregnancy 03/15/2013    Past Surgical History:  Procedure Laterality Date  . BREAST REDUCTION SURGERY  2002  . CARDIAC CATHETERIZATION N/A 12/22/2015   Procedure: Left Heart Cath and Coronary Angiography;  Surgeon: Peter M Martinique, MD;  Location: [facility];  Santiago: Cardiovascular;  Laterality: N/A;  . CESAREAN SECTION N/A 04/09/2014   Procedure: CESAREAN SECTION;  Surgeon: Mora Bellman, MD;  Location: [facility];  Santiago: Obstetrics;  Laterality: N/A;   . CHOLECYSTECTOMY    . HYSTERECTOMY ABDOMINAL WITH SALPINGECTOMY N/A 09/26/2019   Procedure: HYSTERECTOMY ABDOMINAL WITH SALPINGECTOMY;  Surgeon: Florian Buff, MD;  Location: [facility];  Santiago: Gynecology;  Laterality: N/A;  . LIPOMA EXCISION Right 09/26/2019   Procedure: EXCISION LIPOMA RIGHT VULVAR;  Surgeon: Florian Buff, MD;  Location: [facility];  Santiago: Gynecology;  Laterality: Right;     Home Medications:  Prior to Admission medications   Medication Sig Start Date End Date Taking? Authorizing Provider  albuterol (PROVENTIL HFA;VENTOLIN HFA) 108 (90 Base) MCG/ACT inhaler Inhale 1 puff into the lungs every 6 (six) hours as needed for wheezing or shortness of breath.    [provider]  amLODipine (NORVASC) 10 MG tablet Take 1 tablet (10 mg total) by mouth daily. 09/04/19 12/19/19  Strader, Fransisco Hertz, PA-C  ARIPiprazole (ABILIFY) 10 MG tablet Take 1 tablet (10 mg total) by mouth daily. 10/30/18   Barton Dubois, MD  aspirin EC 81 MG tablet Take 81 mg by mouth daily.    [provider]  budesonide-formoterol (SYMBICORT) 80-4.5 MCG/ACT inhaler Inhale 2 puffs into the lungs 2 (two) times daily. 05/04/18   Manuella Ghazi, Pratik D, DO  carvedilol (COREG) 25 MG tablet Take 2 tablets (50 mg total) by mouth 2 (two) times daily with a meal. 01/23/20   Branch, Alphonse Guild, MD  clonazePAM (KLONOPIN) 0.5 MG tablet Take 0.5 mg by mouth 2 (two) times daily. 12/19/18   [provider]  fluconazole (DIFLUCAN) 150 MG tablet Take 1 now and 1 in 3 days 12/19/19   Derrek Monaco A, NP  gabapentin (NEURONTIN) 600 MG tablet Take 600 mg by mouth 3 (three) times daily.     [provider]  hydrALAZINE (APRESOLINE) 100 MG tablet Take 1 tablet (100 mg total) by mouth 3 (three) times daily. 01/23/20   Arnoldo Lenis, MD  ibuprofen (ADVIL) 800 MG tablet Take 1 tablet (800 mg total) by mouth every 6 (six) hours. 09/28/19   Florian Buff, MD  insulin glargine (LANTUS) 100 UNIT/ML injection Inject  30 Units at bedtime into the skin.     [provider]  insulin lispro (HUMALOG) 100 UNIT/ML injection Inject 15 Units into the skin 3 (three) times daily before meals.     [provider]  Ipratropium-Albuterol (COMBIVENT RESPIMAT) 20-100 MCG/ACT AERS respimat Inhale 1 puff into the lungs every 6 (six) hours as needed for wheezing or shortness of breath. 05/04/18   Manuella Ghazi, Pratik D, DO  linaclotide Rolan Lipa) 145 MCG CAPS capsule Take 1 capsule (145 mcg total) by mouth daily before breakfast. 03/14/19   Erenest Rasher, PA-C  metroNIDAZOLE (FLAGYL) 500 MG tablet Take 1 tablet (500 mg total) by mouth 2 (two) times daily. 12/19/19   Estill Dooms, NP  nitroGLYCERIN (NITROSTAT) 0.4 MG SL tablet Place 1 tablet (0.4 mg total) under the tongue every 5 (five) minutes as needed for chest pain. 09/05/15   Kathie Dike, MD  ondansetron (ZOFRAN ODT) 8 MG disintegrating  tablet Take 1 tablet (8 mg total) by mouth every 8 (eight) hours as needed for nausea or vomiting. 09/06/19   Long, Wonda Olds, MD  potassium chloride SA (KLOR-CON M20) 20 MEQ tablet Take 2 tablets (40 mEq total) by mouth daily. 09/04/19 12/19/19  Strader, Fransisco Hertz, PA-C  potassium chloride SA (KLOR-CON) 20 MEQ tablet Take 1 tablet (20 mEq total) by mouth 2 (two) times daily. 03/12/20   Fredia Sorrow, MD  promethazine-codeine (PHENERGAN WITH CODEINE) 6.25-10 MG/5ML syrup Take 5 mLs by mouth every 4 (four) hours as needed for cough. 02/06/20   Evalee Jefferson, PA-C  pyridostigmine (MESTINON) 60 MG tablet Take 60 mg by mouth every 8 (eight) hours.    [provider]  sacubitril-valsartan (ENTRESTO) 97-103 MG Take 1 tablet by mouth 2 (two) times daily. 03/16/19   Arnoldo Lenis, MD  torsemide (DEMADEX) 20 MG tablet Take 2 tablets (40 mg total) by mouth daily. 09/04/19 12/19/19  Strader, Fransisco Hertz, PA-C  traZODone (DESYREL) 100 MG tablet Take 2 tablets (200 mg total) by mouth at bedtime. 10/29/18   Barton Dubois, MD   triamcinolone ointment (KENALOG) 0.5 % Apply 1 application topically 2 (two) times daily. 12/19/19   Estill Dooms, NP    Inpatient Medications: Scheduled Meds: . sodium chloride   Intravenous Once  . amLODipine  10 mg Oral Daily  . ARIPiprazole  10 mg Oral Daily  . aspirin EC  81 mg Oral Daily  . carvedilol  50 mg Oral BID WC  . clonazePAM  0.5 mg Oral BID  . furosemide  40 mg Intravenous BID  . gabapentin  600 mg Oral TID  . heparin  5,000 Units Subcutaneous Q8H  . hydrALAZINE  100 mg Oral TID  . insulin aspart  0-20 Units Subcutaneous TID WC  . insulin aspart  0-5 Units Subcutaneous QHS  . insulin aspart  4 Units Subcutaneous TID WC  . insulin glargine  30 Units Subcutaneous QHS  . mometasone-formoterol  2 puff Inhalation BID  . pyridostigmine  60 mg Oral Q8H  . sacubitril-valsartan  1 tablet Oral BID  . traZODone  200 mg Oral QHS   Continuous Infusions: . nitroGLYCERIN Stopped (03/28/20 0331)   PRN Meds: acetaminophen **OR** acetaminophen, albuterol, diphenhydrAMINE, Ipratropium-Albuterol, ketorolac, ondansetron **OR** ondansetron (ZOFRAN) IV, ondansetron, polyethylene glycol  Allergies:    Allergies  Allergen Reactions  . Diclofenac Swelling    AND POSSIBLE SYNCOPE; tolerates ibuprofen per pt  . Tramadol Nausea And Vomiting and Nausea Only    Itching (12/21); tolerates ibuprofen per pt  . Vicodin [Hydrocodone-Acetaminophen] Itching and Nausea Only    Social History:   Social History   Socioeconomic History  . Marital status: Married    Spouse name: Not on file  . Number of children: 6  . Years of education: Not on file  . Highest education level: Not on file  Occupational History  . Occupation: unemployed    Fish farm manager: UNEMPLOYED  Tobacco Use  . Smoking status: Never Smoker  . Smokeless tobacco: Never Used  Vaping Use  . Vaping Use: Never used  Substance and Sexual Activity  . Alcohol use: Yes    Comment: occ  . Drug use: No  . Sexual activity:  Yes    Birth control/protection: Surgical    Comment: hyst  Other Topics Concern  . Not on file  Social History Narrative   Lives in [place]   Engaged/boyfriend (father to youngest chid)   4 children: daughter (43 as  of 2013), sons (15, 54, 7 as of 2013)   Religion: christian   Social Determinants of Radio broadcast assistant Strain:   . Difficulty of Paying Living Expenses: Not on file  Food Insecurity:   . Worried About Charity fundraiser in the Last Year: Not on file  . Ran Out of Food in the Last Year: Not on file  Transportation Needs:   . Lack of Transportation (Medical): Not on file  . Lack of Transportation (Non-Medical): Not on file  Physical Activity:   . Days of Exercise per Week: Not on file  . Minutes of Exercise per Session: Not on file  Stress:   . Feeling of Stress : Not on file  Social Connections:   . Frequency of Communication with Friends and Family: Not on file  . Frequency of Social Gatherings with Friends and Family: Not on file  . Attends Religious Services: Not on file  . Active Member of Clubs or Organizations: Not on file  . Attends Archivist Meetings: Not on file  . Marital Status: Not on file  Intimate Partner Violence:   . Fear of Current or Ex-Partner: Not on file  . Emotionally Abused: Not on file  . Physically Abused: Not on file  . Sexually Abused: Not on file     Family History:    Family History  Problem Relation Age of Onset  . Diabetes Mother 49  . Heart disease Mother   . Hyperlipidemia Paternal Grandfather   . Hypertension Paternal Grandfather   . Heart disease Father   . Hypertension Father   . Heart disease Maternal Grandmother 60  . ADD / ADHD Son   . Hypertension Maternal Uncle   . Heart attack Brother   . Sudden death Neg Hx   . Colon cancer Neg Hx   . Celiac disease Neg Hx   . Inflammatory bowel disease Neg Hx       Review of Systems    General:  No chills, fever, night sweats or weight changes.   Cardiovascular:  No orthopnea, palpitations, paroxysmal nocturnal dyspnea. Positive for chest pain, dyspnea on exertion and edema. Dermatological: No rash, lesions/masses Respiratory: Positive for cough and dyspnea. Urologic: No hematuria, dysuria Abdominal:   No nausea, vomiting, diarrhea, bright red blood per rectum, melena, or hematemesis Neurologic:  No visual changes, wkns, changes in mental status. All other systems reviewed and are otherwise negative except as noted above.  Physical Exam/Data    Vitals:   03/28/20 1051 03/28/20 1100 03/28/20 1145 03/28/20 1146  BP: (!) 157/94 (!) 151/89  134/69  Pulse: 92 87  84  Resp: 16 16  (!) 32  Temp: 98.3 F (36.8 C)   99.6 F (37.6 C)  TempSrc: Oral   Oral  SpO2: 96% 95% 100% 100%  Weight:      Height:        Intake/Output Summary (Last 24 hours) at 03/28/2020 1150 Last data filed at 03/28/2020 1000 Gross per 24 hour  Intake --  Output 900 ml  Net -900 ml   Filed Weights   03/27/20 1727  Weight: 102.1 kg   Body mass index is 36.32 kg/m.   General: Pleasant female appearing in NAD Psych: Normal affect. Neuro: Alert and oriented X 3. Moves all extremities spontaneously. HEENT: Normal  Neck: Supple without bruits or JVD. Lungs:  Resp regular and unlabored, decreased breath sounds along bases bilaterally. Heart: RRR no s3, s4, or murmurs. Abdomen: Soft,  non-tender, non-distended, BS + x 4.  Extremities: No clubbing or cyanosis. Trace lower extremity edema bilaterally. DP/PT/Radials 2+ and equal bilaterally.   EKG:  The EKG was personally reviewed and demonstrates: Sinus tachycardia, HR 111 with nonspecific ST abnormalities along the lateral leads.   Telemetry:  Telemetry was personally reviewed and demonstrates: Initially tachycardiac with HR in the 110's to 130's, now in the 80's to 90's.    Labs/Studies     Relevant CV Studies:  Cardiac Catheterization: 12/2015  The left ventricular systolic function is  normal.  LV end diastolic pressure is mildly elevated.  The left ventricular ejection fraction is 50-55% by visual estimate.   1. No significant CAD 2. Good LV function 3. Mildly elevated LV EDP.  Plan: medical therapy   Echocardiogram: 10/2018 IMPRESSIONS    1. The left ventricle has a visually estimated ejection fraction of 45%.  The cavity size was normal. There is severe concentric left ventricular  hypertrophy. Left ventricular diastolic Doppler parameters are consistent  with pseudonormalization. Elevated  left ventricular end-diastolic pressure Left ventricular diffuse  hypokinesis.  2. The right ventricle has normal systolic function. The cavity was  normal. There is no increase in right ventricular wall thickness.  3. Left atrial size was severely dilated.  4. The mitral valve is grossly normal.  5. The tricuspid valve is grossly normal.  6. The aortic valve is tricuspid. Mild thickening of the aortic valve.  7. Moderate aortic annular calcification.  8. The aortic root is normal in size and structure.   Laboratory Data:  Chemistry Recent Labs  Lab 03/27/20 1940 03/28/20 0426  NA 131* 132*  K 3.8 3.9  CL 98 99  CO2 24 26  GLUCOSE 245* 272*  BUN 15 15  CREATININE 1.05* 1.12*  CALCIUM 8.5* 8.1*  GFRNONAA >60 >60  ANIONGAP 9 7    Recent Labs  Lab 03/27/20 1940 03/28/20 0426  PROT 7.1 6.6  ALBUMIN 3.3* 2.9*  AST 16 15  ALT 12 12  ALKPHOS 72 62  BILITOT 0.6 0.8   Hematology Recent Labs  Lab 03/27/20 1940 03/27/20 2134 03/28/20 0426  WBC 11.0*  --  10.1  RBC 2.33* 4.51 4.24  HGB 5.8* 10.5* 10.0*  HCT 18.0* 34.5* 32.6*  MCV 77.3*  --  76.9*  MCH 24.9*  --  23.6*  MCHC 32.2  --  30.7  RDW 15.3  --  14.8  PLT 259  --  221   Cardiac EnzymesNo results for input(s): TROPONINI in the last 168 hours. No results for input(s): TROPIPOC in the last 168 hours.  BNP Recent Labs  Lab 03/27/20 1940  BNP 444.0*    DDimer No results for  input(s): DDIMER in the last 168 hours.  Radiology/Studies:  DG Chest Port 1 View  Result Date: 03/28/2020 CLINICAL DATA:  Cough. EXAM: PORTABLE CHEST 1 VIEW COMPARISON:  03/27/2020 FINDINGS: The heart is enlarged but appears stable. Moderate central vascular congestion. Lower lung volumes with patchy bibasilar infiltrates suspected. No pleural effusions. The bony thorax is intact. IMPRESSION: 1. Cardiac enlargement with moderate central vascular congestion. 2. Bibasilar infiltrates. Electronically Signed   By: Marijo Sanes M.D.   On: 03/28/2020 07:13   DG Chest Portable 1 View  Result Date: 03/27/2020 CLINICAL DATA:  44 year old female with shortness of breath. EXAM: PORTABLE CHEST 1 VIEW COMPARISON:  Chest radiograph dated 03/12/2020. FINDINGS: There is cardiomegaly with vascular congestion. No focal consolidation, pleural effusion or pneumothorax. No acute osseous pathology. IMPRESSION:  Cardiomegaly with vascular congestion. No focal consolidation. Electronically Signed   By: Anner Crete M.D.   On: 03/27/2020 18:47     Assessment & Plan    1. Acute on Chronic Combined Systolic and Diastolic CHF - Her EF was at 40-45% by echo in 12/2015 with cath showing normal cors, EF at 45% by repeat echo in 10/2018.  - Presented with worsening dyspnea and a productive cough for the past few weeks. BNP elevated to 444 and CXR consistent with CHF. She has been started on IV Lasix 40 mg twice daily. Will continue to follow I's and O's along with daily weights. She was on Torsemide 40 mg daily prior to admission so suspect this would need to be titrated to 60 mg daily at the time of discharge. A repeat echocardiogram has been ordered with the official report pending. Continue Coreg, Entresto and Hydralazine for her cardiomyopathy. She denies any recurrent chest pain following improvement in BP and given her flat HS Troponin values and normal cath in 2017, would not anticipate further ischemic testing this  admission.   2. Hypertensive Emergency - BP was significantly elevated to 215/148 while in the ED and she required IV nitroglycerin which has now been discontinued. She has been restarted on her home medications of Amlodipine 10mg  daily, Coreg 50mg  BID, Hydralazine 100mg  TID and Entresto 97-103mg  BID with BP at 122/90 on most recent check. Given significant improvement in her readings following initiation of her home medications, this raises concern for medication compliance in the outpatient setting. She does report being under increased stress at home as well.  If she continues to have episodes of elevated BP despite medication compliance, could consider further work-up for secondary hypertension as discussed at prior office visits. She previously had a renal artery duplex ordered but this was never performed. Also suspect her untreated OSA is contributing as well.  3. Uncontrolled Type II DM - Hgb A1c elevated to 10.6 this admission. Further management per admitting team.   4. OSA - She reports a history of sleep apnea but does not currently have a CPAP machine. She was previously referred for a repeat sleep study but unable to attend. Would recommend arranging for a repeat sleep study as an outpatient.   For questions or updates, please contact [place] Please consult [email or web address] for contact info under Cardiology/STEMI.  Signed, Erma Heritage, PA-C 03/28/2020, 11:50 AM Pager: [phone]   Attending note:  Patient seen and examined.  I reviewed her records and discussed the case with Chelsey Santiago, I agree with her above findings.  Chelsey Santiago presents to the ER reporting worsening shortness of breath and intermittent cough, found to be severely hypertensive and with evidence of vascular congestion by chest x-ray.  She has been managed initially with BiPAP and IV Lasix, also placed on IV nitroglycerin with initiation of her baseline medications.  She is improving  clinically with decreased oxygen requirement and blood pressure is coming down.  On examination she is in no distress.  She is afebrile.  Most recent blood pressure 134/69, heart rate 84 in sinus rhythm by telemetry.  Lungs exhibit a few scattered crackles at the bases.  Cardiac exam with RRR and soft systolic murmur.  Pertinent lab work includes potassium 3.9, BUN 15, creatinine 1.12, high-sensitivity troponin I levels of 25 and 23 not suggestive of ACS, hemoglobin 10.0, platelets 221, hemoglobin A1c 10.6%, TSH 0.345.  Chest x-ray reports cardiac enlargement with central vascular congestion and bibasilar infiltrates.  I personally reviewed her ECG which shows sinus tachycardia with biatrial enlargement, increased voltage and repolarization abnormalities.  Patient presents with hypertensive emergency and acute on chronic combined heart failure.  Most recent LVEF 45% by echocardiogram in June of last year.  Outpatient medical regimen is well-rounded, not entirely certain about consistency of use.  She has no history of underlying ischemic heart disease as precipitant, does have OSA presently without active CPAP treatment.  Would continue supportive measures, resume baseline medications and continue IV diuresis at least in the short-term.  Repeat echocardiogram to ensure no major change in LVEF.  Would eventually obtain renal artery Dopplers to exclude renal artery stenosis.  Satira Sark, M.D., F.A.C.C.

## 2020-03-28 NOTE — ED Notes (Signed)
Pt placed back on bipap machine

## 2020-03-28 NOTE — ED Notes (Signed)
Pt placed on purewick per request. 

## 2020-03-28 NOTE — ED Notes (Signed)
Pt in bed with eyes closed, pt satting 88 on room air, pt placed on 2L O2 via [place], pt now satting 96-99% on 2L.  Pt requests something for her cough, md notified, meds given.

## 2020-03-28 NOTE — H&P (Signed)
TRH H&P    Patient Demographics:    Chelsey Santiago, is a 44 y.o. female  MRN: [id number]  DOB - October 22, 1975  Admit Date - 03/27/2020  Referring MD/NP/PA: Long  Outpatient Primary MD for the patient is Rosita Fire, MD  Patient coming from: Home  Chief complaint- dyspnea   HPI:    Chelsey Santiago  is a 44 y.o. female, with history of sleep apnea, pulmonary edema, obesity, hypertensive heart disease, hypertension, diabetes mellitus type 2, CHF, and more presents to the ED with a chief complaint of dyspnea.  Patient reports that this has been going on for 2 or 3 days.  She has been feeling dyspneic with exertion, that has been getting progressively worse.  Today she acutely had a change where she felt dyspnea on rest.  She reports she is also had a cough that is productive sometimes of yellow mucus, sometimes white foam.  It is worse when she is laying down and she has to sit up.  She also reports dry heaving after the cough.  Patient reports that she has not been swelling in her legs but she does think that her abdomen is swollen.  She reports that she was in the ER 2 weeks ago for swelling in her abdomen and thighs they gave her labetalol and Lasix and discharged her.  Patient takes torsemide 20 at home.  She reports that she is "supposed" to take it 4 times daily.  Then when asked if she actually does take the medication she says yes but it seems inconsistent.  Patient reports that she does not follow any fluid restrictions at home.  She reports that because her diabetes she states thirsty all the time and drinks as much as she wants.  She reports that today she missed her midday and eating blood pressure medications.  She is not sure what her blood pressure normally runs at home because "cuff is broken."  She reports when checked in clinic it usually around 160/107.  Today patient had associated chest tightness that  was on the left side of her chest and in the middle of her chest.  Radiated up to her left neck and left arm.  Was worse with exertion but it was still present with rest.  Patient denies palpitations but admits to diaphoresis and nausea.  Patient reports that she is nauseous chronically at baseline.  May need a gastroparesis work-up outpatient.  In the ED Temperature 98.5, heart rate 109, respiratory rate 12-46, blood pressure 179/120 at admission ED provider reports patient was very tachycardic, tachypneic, coarse hypertensive up to 210/194, and with chest pain at arrival She was given Nitrostat x2 and then started on a nitro drip.  She is also started on BiPAP. White blood cell count was 11, initial hemoglobin was 5.8 but on recheck it was 10.  They rechecked it again it was still 10.  The initial reading is thought to be lab error. Chemistry panel was mostly unremarkable except for hyperglycemia at 245 Albumin is 3.3 BNP is 444 Chest x-ray shows  cardiomegaly with vascular congestion Patient was given albuterol, fentanyl, nitro as mentioned above, and Zofran    Review of systems:    In addition to the HPI above,  Review of Systems  Constitutional: Positive for diaphoresis and malaise/fatigue. Negative for chills and fever.  HENT: Negative for ear discharge, ear pain, sinus pain and sore throat.   Eyes: Negative for pain and discharge.  Respiratory: Positive for cough, sputum production and shortness of breath. Negative for wheezing.   Cardiovascular: Positive for chest pain and orthopnea. Negative for palpitations and leg swelling.  Gastrointestinal: Positive for constipation and nausea. Negative for abdominal pain, blood in stool, diarrhea and vomiting.  Genitourinary: Negative for dysuria, frequency and urgency.  Musculoskeletal: Negative for myalgias.  Skin: Negative for itching and rash.  Neurological: Positive for dizziness and weakness.  Psychiatric/Behavioral: Negative for  substance abuse.   .  All other systems reviewed and are negative.    Past History of the following :    Past Medical History:  Diagnosis Date   Anemia    H&H of 10.6/33 and 07/2008 and 11.9/35 and 09/2010   Anxiety    Chronic combined systolic and diastolic CHF (congestive heart failure) ([place])    Depression with anxiety    Diabetes mellitus without complication ([place])    Hypertension    Hypertensive heart disease 2009   Pulmonary edema postpartum; mild to moderate mitral regurgitation when hospitalized for CHF in 2009; Echocardiogram in 12/2009-no MR and normal EF; normal CXR in 09/2010   Migraine headache    Miscarriage 03/19/2013   Obesity 04/16/2009   Osteoarthritis, knee 03/29/2011   Preeclampsia    Pulmonary edema    Sleep apnea    Threatened abortion in early pregnancy 03/15/2013      Past Surgical History:  Procedure Laterality Date   BREAST REDUCTION SURGERY  2002   CARDIAC CATHETERIZATION N/A 12/22/2015   Procedure: Left Heart Cath and Coronary Angiography;  Surgeon: Peter M Martinique, MD;  Location: [facility];  Service: Cardiovascular;  Laterality: N/A;   CESAREAN SECTION N/A 04/09/2014   Procedure: CESAREAN SECTION;  Surgeon: Mora Bellman, MD;  Location: [facility];  Service: Obstetrics;  Laterality: N/A;   CHOLECYSTECTOMY     HYSTERECTOMY ABDOMINAL WITH SALPINGECTOMY N/A 09/26/2019   Procedure: HYSTERECTOMY ABDOMINAL WITH SALPINGECTOMY;  Surgeon: Florian Buff, MD;  Location: [facility];  Service: Gynecology;  Laterality: N/A;   LIPOMA EXCISION Right 09/26/2019   Procedure: EXCISION LIPOMA RIGHT VULVAR;  Surgeon: Florian Buff, MD;  Location: [facility];  Service: Gynecology;  Laterality: Right;      Social History:      Social History   Tobacco Use   Smoking status: Never Smoker   Smokeless tobacco: Never Used  Substance Use Topics   Alcohol use: Yes    Comment: occ       Family History :     Family History  Problem Relation  Age of Onset   Diabetes Mother 28   Heart disease Mother    Hyperlipidemia Paternal Grandfather    Hypertension Paternal Grandfather    Heart disease Father    Hypertension Father    Heart disease Maternal Grandmother 68   ADD / ADHD Son    Hypertension Maternal Uncle    Heart attack Brother    Sudden death Neg Hx    Colon cancer Neg Hx    Celiac disease Neg Hx    Inflammatory bowel disease Neg Hx  Home Medications:   Prior to Admission medications   Medication Sig Start Date End Date Taking? Authorizing Provider  albuterol (PROVENTIL HFA;VENTOLIN HFA) 108 (90 Base) MCG/ACT inhaler Inhale 1 puff into the lungs every 6 (six) hours as needed for wheezing or shortness of breath.    [provider]  amLODipine (NORVASC) 10 MG tablet Take 1 tablet (10 mg total) by mouth daily. 09/04/19 12/19/19  Strader, Fransisco Hertz, PA-C  ARIPiprazole (ABILIFY) 10 MG tablet Take 1 tablet (10 mg total) by mouth daily. 10/30/18   Barton Dubois, MD  aspirin EC 81 MG tablet Take 81 mg by mouth daily.    [provider]  budesonide-formoterol (SYMBICORT) 80-4.5 MCG/ACT inhaler Inhale 2 puffs into the lungs 2 (two) times daily. 05/04/18   Manuella Ghazi, Pratik D, DO  carvedilol (COREG) 25 MG tablet Take 2 tablets (50 mg total) by mouth 2 (two) times daily with a meal. 01/23/20   Branch, Alphonse Guild, MD  clonazePAM (KLONOPIN) 0.5 MG tablet Take 0.5 mg by mouth 2 (two) times daily. 12/19/18   [provider]  fluconazole (DIFLUCAN) 150 MG tablet Take 1 now and 1 in 3 days 12/19/19   Derrek Monaco A, NP  gabapentin (NEURONTIN) 600 MG tablet Take 600 mg by mouth 3 (three) times daily.     [provider]  hydrALAZINE (APRESOLINE) 100 MG tablet Take 1 tablet (100 mg total) by mouth 3 (three) times daily. 01/23/20   Arnoldo Lenis, MD  ibuprofen (ADVIL) 800 MG tablet Take 1 tablet (800 mg total) by mouth every 6 (six) hours. 09/28/19   Florian Buff, MD  insulin glargine  (LANTUS) 100 UNIT/ML injection Inject 30 Units at bedtime into the skin.     [provider]  insulin lispro (HUMALOG) 100 UNIT/ML injection Inject 15 Units into the skin 3 (three) times daily before meals.     [provider]  Ipratropium-Albuterol (COMBIVENT RESPIMAT) 20-100 MCG/ACT AERS respimat Inhale 1 puff into the lungs every 6 (six) hours as needed for wheezing or shortness of breath. 05/04/18   Manuella Ghazi, Pratik D, DO  linaclotide Rolan Lipa) 145 MCG CAPS capsule Take 1 capsule (145 mcg total) by mouth daily before breakfast. 03/14/19   Erenest Rasher, PA-C  metroNIDAZOLE (FLAGYL) 500 MG tablet Take 1 tablet (500 mg total) by mouth 2 (two) times daily. 12/19/19   Estill Dooms, NP  nitroGLYCERIN (NITROSTAT) 0.4 MG SL tablet Place 1 tablet (0.4 mg total) under the tongue every 5 (five) minutes as needed for chest pain. 09/05/15   Kathie Dike, MD  ondansetron (ZOFRAN ODT) 8 MG disintegrating tablet Take 1 tablet (8 mg total) by mouth every 8 (eight) hours as needed for nausea or vomiting. 09/06/19   Long, Wonda Olds, MD  potassium chloride SA (KLOR-CON M20) 20 MEQ tablet Take 2 tablets (40 mEq total) by mouth daily. 09/04/19 12/19/19  Strader, Fransisco Hertz, PA-C  potassium chloride SA (KLOR-CON) 20 MEQ tablet Take 1 tablet (20 mEq total) by mouth 2 (two) times daily. 03/12/20   Fredia Sorrow, MD  promethazine-codeine (PHENERGAN WITH CODEINE) 6.25-10 MG/5ML syrup Take 5 mLs by mouth every 4 (four) hours as needed for cough. 02/06/20   Evalee Jefferson, PA-C  pyridostigmine (MESTINON) 60 MG tablet Take 60 mg by mouth every 8 (eight) hours.    [provider]  sacubitril-valsartan (ENTRESTO) 97-103 MG Take 1 tablet by mouth 2 (two) times daily. 03/16/19   Arnoldo Lenis, MD  torsemide (DEMADEX) 20 MG  tablet Take 2 tablets (40 mg total) by mouth daily. 09/04/19 12/19/19  Strader, Fransisco Hertz, PA-C  traZODone (DESYREL) 100 MG tablet Take 2 tablets (200 mg total) by mouth at bedtime.  10/29/18   Barton Dubois, MD  triamcinolone ointment (KENALOG) 0.5 % Apply 1 application topically 2 (two) times daily. 12/19/19   Estill Dooms, NP     Allergies:     Allergies  Allergen Reactions   Diclofenac Swelling    AND POSSIBLE SYNCOPE; tolerates ibuprofen per pt   Tramadol Nausea And Vomiting and Nausea Only    Itching (12/21); tolerates ibuprofen per pt   Vicodin [Hydrocodone-Acetaminophen] Itching and Nausea Only     Physical Exam:   Vitals  Blood pressure (!) 145/88, pulse 94, temperature 98.5 F (36.9 C), temperature source Oral, resp. rate (!) 32, height 5\' 6"  (1.676 m), weight 102.1 kg, last menstrual period 08/21/2019, SpO2 99 %.  1.  General: Patient lying supine in bed drowsy no acute distress  2. Psychiatric: Irritable, cooperative with exam, alert and oriented x3  3. Neurologic: Cranial nerves II through XII are intact, moves all 4 extremities voluntarily, no focal deficit on limited exam  4. HEENMT:  Head is atraumatic, normocephalic, pupils reactive to light, neck is supple, trachea is midline, mucous membranes are moist  5. Respiratory : Lungs are clear to auscultation bilaterally without crackles, wheeze, rhonchi  6. Cardiovascular : Heart rate is tachycardic, rhythm is regular, no murmurs rubs or gallops  7. Gastrointestinal:  Abdomen is soft, nondistended, nontender for palpation  8. Skin:  No acute lesions on limited skin exam  9.Musculoskeletal:  Trace pitting edema, no acute deformity, no calf tenderness    Data Review:    CBC Recent Labs  Lab 03/27/20 1940 03/27/20 2134  WBC 11.0*  --   HGB 5.8* 10.5*  HCT 18.0* 34.5*  PLT 259  --   MCV 77.3*  --   MCH 24.9*  --   MCHC 32.2  --   RDW 15.3  --   LYMPHSABS 1.9  --   MONOABS 0.6  --   EOSABS 0.1  --   BASOSABS 0.1  --    ------------------------------------------------------------------------------------------------------------------  Results for orders placed  or performed during the hospital encounter of 03/27/20 (from the past 48 hour(s))  Respiratory Panel by RT PCR (Flu A&B, Covid) - Nasopharyngeal Swab     Status: None   Collection Time: 03/27/20  6:03 PM   Specimen: Nasopharyngeal Swab  Result Value Ref Range   SARS Coronavirus 2 by RT PCR NEGATIVE NEGATIVE    Comment: (NOTE) SARS-CoV-2 target nucleic acids are NOT DETECTED.  The SARS-CoV-2 RNA is generally detectable in upper respiratoy specimens during the acute phase of infection. The lowest concentration of SARS-CoV-2 viral copies this assay can detect is 131 copies/mL. A negative result does not preclude SARS-Cov-2 infection and should not be used as the sole basis for treatment or other patient management decisions. A negative result may occur with  improper specimen collection/handling, submission of specimen other than nasopharyngeal swab, presence of viral mutation(s) within the areas targeted by this assay, and inadequate number of viral copies (<131 copies/mL). A negative result must be combined with clinical observations, patient history, and epidemiological information. The expected result is Negative.  Fact Sheet for Patients:  PinkCheek.be  Fact Sheet for Healthcare Providers:  GravelBags.it  This test is no t yet approved or cleared by the [place] FDA and  has been authorized for detection  and/or diagnosis of SARS-CoV-2 by FDA under an Emergency Use Authorization (EUA). This EUA will remain  in effect (meaning this test can be used) for the duration of the COVID-19 declaration under Section 564(b)(1) of the Act, 21 U.S.C. section 360bbb-3(b)(1), unless the authorization is terminated or revoked sooner.     Influenza A by PCR NEGATIVE NEGATIVE   Influenza B by PCR NEGATIVE NEGATIVE    Comment: (NOTE) The Xpert Xpress SARS-CoV-2/FLU/RSV assay is intended as an aid in  the diagnosis of influenza from  Nasopharyngeal swab specimens and  should not be used as a sole basis for treatment. Nasal washings and  aspirates are unacceptable for Xpert Xpress SARS-CoV-2/FLU/RSV  testing.  Fact Sheet for Patients: PinkCheek.be  Fact Sheet for Healthcare Providers: GravelBags.it  This test is not yet approved or cleared by the [place] FDA and  has been authorized for detection and/or diagnosis of SARS-CoV-2 by  FDA under an Emergency Use Authorization (EUA). This EUA will remain  in effect (meaning this test can be used) for the duration of the  Covid-19 declaration under Section 564(b)(1) of the Act, 21  U.S.C. section 360bbb-3(b)(1), unless the authorization is  terminated or revoked. Performed at [facility], [address], [place], [place] [address]   Comprehensive metabolic panel     Status: Abnormal   Collection Time: 03/27/20  7:40 PM  Result Value Ref Range   Sodium 131 (L) 135 - 145 mmol/L   Potassium 3.8 3.5 - 5.1 mmol/L   Chloride 98 98 - 111 mmol/L   CO2 24 22 - 32 mmol/L   Glucose, Bld 245 (H) 70 - 99 mg/dL    Comment: Glucose reference range applies only to samples taken after fasting for at least 8 hours.   BUN 15 6 - 20 mg/dL   Creatinine, Ser 1.05 (H) 0.44 - 1.00 mg/dL   Calcium 8.5 (L) 8.9 - 10.3 mg/dL   Total Protein 7.1 6.5 - 8.1 g/dL   Albumin 3.3 (L) 3.5 - 5.0 g/dL   AST 16 15 - 41 U/L   ALT 12 0 - 44 U/L   Alkaline Phosphatase 72 38 - 126 U/L   Total Bilirubin 0.6 0.3 - 1.2 mg/dL   GFR, Estimated >60 >60 mL/min    Comment: (NOTE) Calculated using the CKD-EPI Creatinine Equation (2021)    Anion gap 9 5 - 15    Comment: Performed at [facility], [address], [place], [place] [address]  CBC with Differential     Status: Abnormal   Collection Time: 03/27/20  7:40 PM  Result Value Ref Range   WBC 11.0 (H) 4.0 - 10.5 K/uL   RBC 2.33 (L) 3.87 - 5.11 MIL/uL   Hemoglobin 5.8 (LL) 12.0 - 15.0 g/dL     Comment: REPEATED TO VERIFY Reticulocyte Hemoglobin testing may be clinically indicated, consider ordering this additional test [id number] THIS CRITICAL RESULT HAS VERIFIED AND BEEN CALLED TO K SITES,RN BY MARIE KELLY ON 11 11 2021 AT 2029, AND HAS BEEN READ BACK.     HCT 18.0 (L) 36 - 46 %   MCV 77.3 (L) 80.0 - 100.0 fL   MCH 24.9 (L) 26.0 - 34.0 pg   MCHC 32.2 30.0 - 36.0 g/dL   RDW 15.3 11.5 - 15.5 %   Platelets 259 150 - 400 K/uL   nRBC 0.0 0.0 - 0.2 %   Neutrophils Relative % 75 %   Neutro Abs 8.3 (H) 1.7 - 7.7 K/uL  Lymphocytes Relative 17 %   Lymphs Abs 1.9 0.7 - 4.0 K/uL   Monocytes Relative 5 %   Monocytes Absolute 0.6 0.1 - 1.0 K/uL   Eosinophils Relative 1 %   Eosinophils Absolute 0.1 0.0 - 0.5 K/uL   Basophils Relative 1 %   Basophils Absolute 0.1 0.0 - 0.1 K/uL   Immature Granulocytes 1 %   Abs Immature Granulocytes 0.07 0.00 - 0.07 K/uL    Comment: Performed at [facility], [address], [place], [place] [address]  Brain natriuretic peptide     Status: Abnormal   Collection Time: 03/27/20  7:40 PM  Result Value Ref Range   B Natriuretic Peptide 444.0 (H) 0.0 - 100.0 pg/mL    Comment: Performed at [facility], [address] [place], [place] [address]  Type and screen [facility]     Status: None (Preliminary result)   Collection Time: 03/27/20  8:35 PM  Result Value Ref Range   ABO/RH(D) O NEG    Antibody Screen NEG    Sample Expiration      03/30/2020,2359 Performed at [facility], [address] [place], [place] [address]    Unit Number [id number]    Blood Component Type RED CELLS,LR    Unit division 00    Status of Unit ALLOCATED    Transfusion Status OK TO TRANSFUSE    Crossmatch Result Compatible    Unit Number [id number]    Blood Component Type RED CELLS,LR    Unit division 00    Status of Unit ALLOCATED    Transfusion Status OK TO TRANSFUSE    Crossmatch Result Compatible   Prepare RBC (crossmatch)     Status: None    Collection Time: 03/27/20  8:35 PM  Result Value Ref Range   Order Confirmation      ORDER PROCESSED BY BLOOD BANK Performed at [facility], [address] [place], [place] [address]   POC occult blood, ED     Status: None   Collection Time: 03/27/20  9:27 PM  Result Value Ref Range   Fecal Occult Bld NEGATIVE NEGATIVE  Troponin I (High Sensitivity)     Status: Abnormal   Collection Time: 03/27/20  9:33 PM  Result Value Ref Range   Troponin I (High Sensitivity) 25 (H) <18 ng/L    Comment: (NOTE) Elevated high sensitivity troponin I (hsTnI) values and significant  changes across serial measurements may suggest ACS but many other  chronic and acute conditions are known to elevate hsTnI results.  Refer to the "Links" section for chest pain algorithms and additional  guidance. Performed at [facility], [address] [place], [place] [address]   Vitamin B12     Status: None   Collection Time: 03/27/20  9:33 PM  Result Value Ref Range   Vitamin B-12 337 180 - 914 pg/mL    Comment: (NOTE) This assay is not validated for testing neonatal or myeloproliferative syndrome specimens for Vitamin B12 levels. Performed at [facility], [address] [place], [place] [address]   Iron and TIBC     Status: Abnormal   Collection Time: 03/27/20  9:33 PM  Result Value Ref Range   Iron 30 28 - 170 ug/dL   TIBC 353 250 - 450 ug/dL   Saturation Ratios 9 (L) 10.4 - 31.8 %   UIBC 323 ug/dL    Comment: Performed at [facility], [address] [place],  [address]  Ferritin     Status: None  Collection Time: 03/27/20  9:33 PM  Result Value Ref Range   Ferritin 29 11 - 307 ng/mL    Comment: Performed at [facility], [address], [place], [place] [address]  Folate     Status: None   Collection Time: 03/27/20  9:34 PM  Result Value Ref Range   Folate 11.6 >5.9 ng/mL    Comment: Performed at [facility], [address], [place], [place] [address]  Reticulocytes     Status: None    Collection Time: 03/27/20  9:34 PM  Result Value Ref Range   Retic Ct Pct 1.9 0.4 - 3.1 %   RBC. 4.51 3.87 - 5.11 MIL/uL   Retic Count, Absolute 87.5 19.0 - 186.0 K/uL   Immature Retic Fract 14.3 2.3 - 15.9 %    Comment: Performed at [facility], [address], [place], [place] [address]  Hemoglobin and hematocrit, blood     Status: Abnormal   Collection Time: 03/27/20  9:34 PM  Result Value Ref Range   Hemoglobin 10.5 (L) 12.0 - 15.0 g/dL    Comment: DELTA CHECK NOTED   HCT 34.5 (L) 36 - 46 %    Comment: Performed at [facility], [address] [place], [place] [address]  CBG monitoring, ED     Status: Abnormal   Collection Time: 03/28/20  1:34 AM  Result Value Ref Range   Glucose-Capillary 235 (H) 70 - 99 mg/dL    Comment: Glucose reference range applies only to samples taken after fasting for at least 8 hours.    Chemistries  Recent Labs  Lab 03/27/20 1940  NA 131*  K 3.8  CL 98  CO2 24  GLUCOSE 245*  BUN 15  CREATININE 1.05*  CALCIUM 8.5*  AST 16  ALT 12  ALKPHOS 72  BILITOT 0.6   ------------------------------------------------------------------------------------------------------------------  ------------------------------------------------------------------------------------------------------------------ GFR: Estimated Creatinine Clearance: 82.5 mL/min (A) (by C-G formula based on SCr of 1.05 mg/dL (H)). Liver Function Tests: Recent Labs  Lab 03/27/20 1940  AST 16  ALT 12  ALKPHOS 72  BILITOT 0.6  PROT 7.1  ALBUMIN 3.3*   No results for input(s): LIPASE, AMYLASE in the last 168 hours. No results for input(s): AMMONIA in the last 168 hours. Coagulation Profile: No results for input(s): INR, PROTIME in the last 168 hours. Cardiac Enzymes: No results for input(s): CKTOTAL, CKMB, CKMBINDEX, TROPONINI in the last 168 hours. BNP (last 3 results) No results for input(s): PROBNP in the last 8760 hours. HbA1C: No results for input(s): HGBA1C in the last  72 hours. CBG: Recent Labs  Lab 03/28/20 0134  GLUCAP 235*   Lipid Profile: No results for input(s): CHOL, HDL, LDLCALC, TRIG, CHOLHDL, LDLDIRECT in the last 72 hours. Thyroid Function Tests: No results for input(s): TSH, T4TOTAL, FREET4, T3FREE, THYROIDAB in the last 72 hours. Anemia Panel: Recent Labs    03/27/20 2133 03/27/20 2134  VITAMINB12 337  --   FOLATE  --  11.6  FERRITIN 29  --   TIBC 353  --   IRON 30  --   RETICCTPCT  --  1.9    --------------------------------------------------------------------------------------------------------------- Urine analysis:    Component Value Date/Time   COLORURINE YELLOW 08/28/2019 1226   APPEARANCEUR CLEAR 08/28/2019 1226   LABSPEC 1.028 08/28/2019 1226   PHURINE 6.0 08/28/2019 1226   GLUCOSEU >=500 (A) 08/28/2019 1226   HGBUR NEGATIVE 08/28/2019 1226   BILIRUBINUR NEGATIVE 08/28/2019 1226   KETONESUR NEGATIVE 08/28/2019 1226   PROTEINUR Negative 12/19/2019 1010  PROTEINUR NEGATIVE 08/28/2019 1226   UROBILINOGEN 0.2 04/29/2014 1115   NITRITE n 12/19/2019 1010   NITRITE NEGATIVE 08/28/2019 1226   LEUKOCYTESUR Negative 12/19/2019 1010   LEUKOCYTESUR TRACE (A) 08/28/2019 1226      Imaging Results:    DG Chest Portable 1 View  Result Date: 03/27/2020 CLINICAL DATA:  44 year old female with shortness of breath. EXAM: PORTABLE CHEST 1 VIEW COMPARISON:  Chest radiograph dated 03/12/2020. FINDINGS: There is cardiomegaly with vascular congestion. No focal consolidation, pleural effusion or pneumothorax. No acute osseous pathology. IMPRESSION: Cardiomegaly with vascular congestion. No focal consolidation. Electronically Signed   By: Anner Crete M.D.   On: 03/27/2020 18:47    My personal review of EKG: Rhythm sinus tach, Rate 105/min, QTc 503 ,no Acute ST changes   Assessment & Plan:    Active Problems:   Flash pulmonary edema ([place])   1. Hypertensive emergency 1. Blood pressure 210 over 194  presentation 2. Troponin elevated at 25, which is stable and trended from a measurement 2 weeks ago 3. Flash pulmonary edema on chest x-ray 4. Patient started on nitro drip, home blood pressure meds given 5. Patient on BiPAP 6. BP goal 140-160 7. Admit to stepdown 2. Hyperglycemia in the setting of diabetes mellitus type 2 1. Carb controlled diet 2. Lantus 30 units 3. Sliding scale coverage 4. Last hemoglobin A1c was 12 in May 2021 5. Hemoglobin A1c in the a.m. 3. CHF exacerbation 1. With flash pulmonary edema, BNP 444, orthopnea 2. Daily weights, strict I's and O's, fluid restrictions 3. Last echo was in June 2020 showed ejection fraction of 45% 4. Update echo 5. Continue Lasix 6. Continue aspirin and beta-blocker 7. Continue to monitor 4. Protein calorie malnutrition 1. Albumin 3.3 2. Encourage nutrient dense foods 5. Prolonged QT 1. QTC 503 2. Avoid QT prolonging agents when possible 3. Monitor on telemetry   DVT Prophylaxis-   Heparin- SCDs   AM Labs Ordered, also please review Full Orders  Family Communication: No family at bedside Code Status: Full  Admission status:Inpatient :The appropriate admission status for this patient is INPATIENT. Inpatient status is judged to be reasonable and necessary in order to provide the required intensity of service to ensure the patient's safety. The patient's presenting symptoms, physical exam findings, and initial radiographic and laboratory data in the context of their chronic comorbidities is felt to place them at high risk for further clinical deterioration. Furthermore, it is not anticipated that the patient will be medically stable for discharge from the hospital within 2 midnights of admission. The following factors support the admission status of inpatient.     The patient's presenting symptoms include dyspnea The worrisome physical exam findings include blood pressure 200s over 100s at presentation The initial radiographic  and laboratory data are worrisome because of chest x-ray shows cardiomegaly with vascular congestion The chronic co-morbidities include sleep apnea, obesity, hypertensive heart disease, hypertension, diabetes mellitus, CHF       * I certify that at the point of admission it is my clinical judgment that the patient will require inpatient hospital care spanning beyond 2 midnights from the point of admission due to high intensity of service, high risk for further deterioration and high frequency of surveillance required.*  Time spent in minutes : [place]

## 2020-03-28 NOTE — Progress Notes (Signed)
Patient currently off BIPAP machine and doing well. Sats 99% on 4 lpm nasal cannula. Unit at bedside on standby.

## 2020-03-28 NOTE — Progress Notes (Signed)
Arrived to place patient on BIPAP. Patient eating. Must wait 30 mins after eating before placing on for the night. RN made aware.

## 2020-03-28 NOTE — Progress Notes (Signed)
*  PRELIMINARY RESULTS* Echocardiogram 2D Echocardiogram has been performed.  Chelsey Santiago 03/28/2020, 12:08 PM

## 2020-03-28 NOTE — ED Notes (Signed)
Meal tray given 

## 2020-03-28 NOTE — TOC Initial Note (Signed)
Transition of Care [facility]) - Initial/Assessment Note    Patient Details  Name: Chelsey Santiago MRN: [id number] Date of Birth: Sep 22, 1975  Transition of Care [facility]) CM/SW Contact:    Iona Beard, [place] Phone Number: 03/28/2020, 5:53 PM  Clinical Narrative:                 Pt admitted for Flash pulmonary edema. Pt high risk for readmission. CSW visited pt in ED to complete assessment. Pt states in the home it is her and her kids. Pt states that she can sometimes complete her ADLs independently. Pt states that she sometimes drives, pt states family will provide transportation if needed. Pt states that she she has an aide in the home everyday for about 2 hours through [place]. Pt states that she wants them to start coming more. Pt states that she has a cane to use if needed. Pt states that she has not been on O2 at home but would like to be. CSW informed pt that closer to d/c TOC will f/u to see if needed. CSW spoke with pt about the [employer], explaining the services that could be provided to pt. Pt states that she is not interested in this program. CSW confirmed with pt that she is not interested, pt states that she is not interested in referral being made.   TOC consulted for CHF screen. CSW completed with pt in the ED. Pt states that she does not weigh herself daily but knows that she should. CSW informed pt of importance of daily weights and f/u with cardiologist if weight gain of 2lb or more. Pt states that she tries to follow a heart healthy diet. Pt is followed by cardiologist Dr. Harl Bowie. Pt states that she does take all medications as prescribed. TOC to follow.   Expected Discharge Plan: Home/Self Care Barriers to Discharge: Continued Medical Work up   Patient Goals and CMS Choice Patient states their goals for this hospitalization and ongoing recovery are:: Return home   Choice offered to / list presented to : NA  Expected Discharge Plan and  Services Expected Discharge Plan: Home/Self Care In-house Referral: Clinical Social Work Discharge Planning Services: NA Post Acute Care Choice: NA Living arrangements for the past 2 months: Single Family Home                                      Prior Living Arrangements/Services Living arrangements for the past 2 months: Single Family Home Lives with:: Minor Children Patient language and need for interpreter reviewed:: Yes Do you feel safe going back to the place where you live?: Yes          Current home services: Homehealth aide Criminal Activity/Legal Involvement Pertinent to Current Situation/Hospitalization: No - Comment as needed  Activities of Daily Living      Permission Sought/Granted                  Emotional Assessment Appearance:: Appears stated age Attitude/Demeanor/Rapport: Lethargic Affect (typically observed): Accepting Orientation: : Oriented to Self, Oriented to Place, Oriented to  Time, Oriented to Situation Alcohol / Substance Use: Not Applicable Psych Involvement: No (comment)  Admission diagnosis:  Flash pulmonary edema ([place]) [J81.0] Patient Active Problem List   Diagnosis Date Noted  . Flash pulmonary edema ([place]) 03/27/2020  . Back pain 12/19/2019  . Vaginal discharge 12/19/2019  . Vaginal itching 12/19/2019  .  BV (bacterial vaginosis) 12/19/2019  . Chronic hypertension 12/19/2019  . Fibroids 09/26/2019  . S/P hysterectomy 09/26/2019  . Acute blood loss anemia 09/26/2019  . Abdominal pain, epigastric 03/14/2019  . Dysphagia 03/14/2019  . Gastroesophageal reflux disease 03/14/2019  . Class 2 obesity   . Acute exacerbation of CHF (congestive heart failure) ([place]) 10/26/2018  . Chronic combined systolic and diastolic CHF (congestive heart failure) ([place]) 06/02/2018  . Uncontrolled type 2 diabetes mellitus with hyperglycemia ([place]) 06/02/2018  . Influenza B 05/13/2018  . Hypomagnesemia 05/12/2018  . Headache 05/12/2018  . Upper  respiratory tract infection   . HCAP (healthcare-associated pneumonia)   . Constipation 10/17/2017  . Bad headache   . CHF exacerbation ([place]) 09/29/2017  . Iron deficiency anemia 05/16/2017  . Vitamin D deficiency 11/25/2016  . Iron deficiency 11/25/2016  . History of acute myocardial infarction 10/05/2016  . CHF (congestive heart failure) ([place]) 05/06/2016  . Diabetes mellitus with complication ([place]) 47/65/4650  . Leukocytosis 05/06/2016  . Neuropathy 05/06/2016  . Depression 04/16/2016  . Chronic tension-type headache, not intractable 04/16/2016  . AKI (acute kidney injury) ([place])   . Hyperkalemia   . Nonischemic cardiomyopathy ([place])   . Acute on chronic combined systolic and diastolic ACC/AHA stage C congestive heart failure ([place]) 04/03/2016  . Acute on chronic combined systolic and diastolic CHF, NYHA class 4 ([place]) 04/03/2016  . Hypertensive emergency 04/03/2016  . Cardiomyopathy due to hypertension ([place]) 12/22/2015  . Normal coronary arteries 12/22/2015  . Troponin level elevated 12/22/2015  . NSTEMI (non-ST elevated myocardial infarction) ([place]) 12/20/2015  . Dental infection 10/10/2015  . Chest pain 09/05/2015  . Systolic CHF, chronic ([place]) 09/05/2015  . LLQ pain   . Type 2 diabetes mellitus without complication ([place]) 35/46/5681  . Essential hypertension   . 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 Hypertensive urgency 04/22/2014  . Acute CHF ([place]) 04/22/2014  . S/P cesarean section 04/11/2014  . Acute pulmonary edema ([place]) 04/11/2014  . Postoperative anemia 04/11/2014  . Elevated serum creatinine 04/11/2014  . Preeclampsia, severe 04/09/2014  . Pre-eclampsia superimposed on chronic hypertension, antepartum 04/08/2014  . Dyspnea 04/08/2014  . Polyhydramnios in third trimester, antepartum 03/14/2014  . Abnormal maternal glucose tolerance, antepartum 03/11/2014  . High-risk pregnancy 03/11/2014  . Pre-existing essential hypertension complicating pregnancy 27/51/7001  .  Impaired glucose tolerance during pregnancy, antepartum 11/27/2013  . Leiomyoma of uterus 11/22/2013  . History of gestational diabetes in prior pregnancy, currently pregnant in first trimester 11/22/2013  . Hx of preeclampsia, prior pregnancy, currently pregnant 11/22/2013  . Short interval between pregnancies affecting pregnancy, antepartum 11/22/2013  . Supervision of high-risk pregnancy of elderly primigravida (>= 46 years old at delivery), third trimester 11/22/2013  . Miscarriage 03/19/2013  . Major depressive disorder, single episode, unspecified 09/27/2011  . Hypertension   . Hypertensive cardiovascular disease   . Microcytic anemia   . Osteoarthrosis involving lower leg 03/29/2011  . Hypokalemia 12/12/2009  . OSA on CPAP 12/09/2009  . Morbid obesity ([place]) 04/16/2009   PCP:  Rosita Fire, MD Pharmacy:   [facility], [address] [place] [address] Phone: [phone] Fax: [phone]     Social Determinants of Health ([place]) Interventions    Readmission Risk Interventions Readmission Risk Prevention Plan 03/28/2020  Transportation Screening Complete  HRI or [place] Complete  Social Work Consult for [place] Planning/Counseling Complete  Palliative Care Screening Not Applicable  Medication Review Press [employer]) Complete  Some  recent data might be hidden

## 2020-03-28 NOTE — Progress Notes (Signed)
Patient admitted to the hospital earlier this morning by Dr. Clearence Ped  Patient seen and examined.  Currently on BiPAP.  Overall she feels her shortness of breath is improving.  She denies any chest pain.  She does not have any lower extremity edema.  Lungs relatively clear on exam.  Blood pressures appear to have improved since arrival to the emergency room.  Assessment/plan:  Acute respiratory failure with hypoxia -Secondary to decompensated CHF -Currently on BiPAP -Wean off oxygen as tolerated  Acute on chronic combined CHF -Last EF noted to be 45% -Repeat echo pending -She was noted to have interstitial edema on imaging -Continue on IV Lasix -Appreciate cardiology assistance -Currently on beta-blockers, Entresto  Hypertensive emergency -Presented with a blood pressure over 200 and evidence of heart failure -Briefly on nitro drip which is since been discontinued -On multiple antihypertensives including Coreg, hydralazine, amlodipine, Entresto -Continue to monitor blood pressures  Obstructive sleep apnea -She reports that her CPAP machine has been nonfunctional for quite some time now -Will need repeat sleep study as an outpatient  Type 2 diabetes -On Lantus and sliding scale insulin -A1c 10.6 -Continue to follow blood sugars  [employer]

## 2020-03-28 NOTE — ED Notes (Signed)
Pt bed linens changed due to purewick leaking. New purewick placed on pt and warm blankets given.

## 2020-03-28 NOTE — ED Notes (Signed)
Pt in bed, pt ate most of her meal, pt reports 6/10 chest pain, states that it is worse with her cough, pt then appears to have fallen asleep. resps even and unlabored, pt has dry cough.

## 2020-03-28 NOTE — ED Notes (Signed)
Pt in bed, pt reports some chest pain, pt states that she would like something for a cough.

## 2020-03-29 DIAGNOSIS — E118 Type 2 diabetes mellitus with unspecified complications: Secondary | ICD-10-CM

## 2020-03-29 DIAGNOSIS — J9601 Acute respiratory failure with hypoxia: Secondary | ICD-10-CM

## 2020-03-29 DIAGNOSIS — G4733 Obstructive sleep apnea (adult) (pediatric): Secondary | ICD-10-CM | POA: Diagnosis present

## 2020-03-29 LAB — GLUCOSE, CAPILLARY
Glucose-Capillary: 136 mg/dL — ABNORMAL HIGH (ref 70–99)
Glucose-Capillary: 143 mg/dL — ABNORMAL HIGH (ref 70–99)
Glucose-Capillary: 204 mg/dL — ABNORMAL HIGH (ref 70–99)
Glucose-Capillary: 202 mg/dL — ABNORMAL HIGH (ref 70–99)

## 2020-03-29 LAB — MAGNESIUM: Magnesium: 1.8 mg/dL (ref 1.7–2.4)

## 2020-03-29 LAB — BASIC METABOLIC PANEL
Anion gap: 8 (ref 5–15)
BUN: 17 mg/dL (ref 6–20)
CO2: 27 mmol/L (ref 22–32)
Calcium: 8.3 mg/dL — ABNORMAL LOW (ref 8.9–10.3)
Chloride: 98 mmol/L (ref 98–111)
Creatinine, Ser: 1.21 mg/dL — ABNORMAL HIGH (ref 0.44–1.00)
GFR, Estimated: 57 mL/min — ABNORMAL LOW (ref >60)
Glucose, Bld: 196 mg/dL — ABNORMAL HIGH (ref 70–99)
Potassium: 3.8 mmol/L (ref 3.5–5.1)
Sodium: 133 mmol/L — ABNORMAL LOW (ref 135–145)

## 2020-03-29 LAB — MRSA PCR SCREENING: MRSA by PCR: POSITIVE — AB

## 2020-03-29 MED ORDER — LOPERAMIDE HCL 2 MG PO CAPS
4.0000 mg | ORAL_CAPSULE | Freq: Once | ORAL | Status: AC
  Administered 2020-03-29: 4 mg via ORAL
  Filled 2020-03-29: qty 2

## 2020-03-29 MED ORDER — GUAIFENESIN-DM 100-10 MG/5ML PO SYRP
5.0000 mL | ORAL_SOLUTION | ORAL | Status: DC | PRN
  Administered 2020-03-29 – 2020-03-30 (×3): 5 mL via ORAL
  Filled 2020-03-29 (×3): qty 5

## 2020-03-29 MED ORDER — DM-GUAIFENESIN ER 30-600 MG PO TB12: 1.0000 | ORAL_TABLET | Freq: Two times a day (BID) | ORAL | Status: DC

## 2020-03-29 MED ORDER — CHLORHEXIDINE GLUCONATE CLOTH 2 % EX PADS
6.0000 | MEDICATED_PAD | Freq: Every day | CUTANEOUS | Status: DC
  Administered 2020-03-29 – 2020-03-30 (×2): 6 via TOPICAL

## 2020-03-29 MED ORDER — POTASSIUM CHLORIDE CRYS ER 20 MEQ PO TBCR
40.0000 meq | EXTENDED_RELEASE_TABLET | Freq: Once | ORAL | Status: AC
  Administered 2020-03-29: 40 meq via ORAL
  Filled 2020-03-29: qty 2

## 2020-03-29 MED ORDER — PREDNISONE 20 MG PO TABS
40.0000 mg | ORAL_TABLET | Freq: Every day | ORAL | Status: DC
  Administered 2020-03-29 – 2020-03-30 (×2): 40 mg via ORAL
  Filled 2020-03-29 (×2): qty 2

## 2020-03-29 MED ORDER — IPRATROPIUM-ALBUTEROL 0.5-2.5 (3) MG/3ML IN SOLN: 3.0000 mL | Freq: Four times a day (QID) | RESPIRATORY_TRACT | Status: DC | PRN

## 2020-03-29 MED ORDER — IPRATROPIUM-ALBUTEROL 0.5-2.5 (3) MG/3ML IN SOLN
3.0000 mL | Freq: Four times a day (QID) | RESPIRATORY_TRACT | Status: DC
  Administered 2020-03-29 – 2020-03-30 (×3): 3 mL via RESPIRATORY_TRACT
  Filled 2020-03-29 (×3): qty 3

## 2020-03-29 MED ORDER — MUPIROCIN 2 % EX OINT
TOPICAL_OINTMENT | Freq: Two times a day (BID) | CUTANEOUS | Status: DC
  Filled 2020-03-29: qty 22

## 2020-03-29 MED ORDER — AMOXICILLIN-POT CLAVULANATE 875-125 MG PO TABS
1.0000 | ORAL_TABLET | Freq: Two times a day (BID) | ORAL | Status: DC
  Administered 2020-03-29 – 2020-03-30 (×2): 1 via ORAL
  Filled 2020-03-29 (×2): qty 1

## 2020-03-29 NOTE — Progress Notes (Signed)
PROGRESS NOTE    Chelsey Santiago  OYD:[id number] DOB: [birth year] DOA: 03/27/2020 PCP: Rosita Fire, MD    Brief Narrative:  44 year old female with a history of chronic combined CHF, diabetes, hypertension, obstructive sleep apnea, presents to the emergency room with progressive shortness of breath.  Found to have acute respiratory failure needing BiPAP.  She is felt to have decompensated CHF and hypertensive emergency with blood pressure over 200.  She was started on BiPAP, nitroglycerin infusion and referred for admission.   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  We will add a short course of prednisone and antibiotics  Acute on chronic combined CHF -Ejection fraction of 30 to 35% with grade 2 diastolic dysfunction on echocardiogram -She was noted to have interstitial edema on imaging -Continue on IV Lasix -Appreciate cardiology assistance -Currently on beta-blockers, Entresto  Hypertensive emergency -Presented with a blood pressure over 200 and evidence of heart failure -Briefly on nitro drip which has since been discontinued -On multiple antihypertensives including Coreg, hydralazine, amlodipine, Entresto -Home regimen was resumed on admission with improvement of blood pressures -This will indicate possibly some degree of noncompliance  Obstructive sleep apnea -She reports that her CPAP machine has been  nonfunctional for quite some time now -Will need repeat sleep study as an outpatient  Type 2 diabetes -On Lantus and sliding scale insulin -A1c 10.6 -Blood sugars currently stable  History of orthostatic hypotension -Chronically on pyridostigmine   DVT prophylaxis: heparin injection 5,000 Units Start: 03/28/20 0130 SCDs Start: 03/28/20 0127  Code Status: Full code Family Communication: Discussed with patient Disposition Plan: Status is: Inpatient  Remains inpatient appropriate because:Inpatient level of care appropriate due to severity of illness   Dispo: The patient is from: Home              Anticipated d/c is to: Home              Anticipated d/c date is: 2 days              Patient currently is not medically stable to d/c.  Consultants:   Cardiology  Procedures:   Echocardiogram  Antimicrobials:       Subjective: She has persistent coughing wheezing.  Overall shortness of breath is mildly better.  No chest pain.  Objective: Vitals:   03/29/20 1200 03/29/20 1300 03/29/20 1400 03/29/20 1700  BP: 126/78 122/60 (!) 113/58   Pulse: 79     Resp: 18 (!) 21 18   Temp: 98.5 F (36.9 C)   98.8 F (37.1 C)  TempSrc: Oral   Oral  SpO2: 100%     Weight:      Height:        Intake/Output Summary (Last 24 hours) at 03/29/2020 1839 Last data filed at 03/29/2020 6720 Gross per 24 hour  Intake 120 ml  Output --  Net 120 ml   Filed Weights   03/27/20 1727  Weight: 102.1 kg  Examination:  General exam: Appears calm and comfortable  Respiratory system: bilateral wheezing. Respiratory effort normal. Cardiovascular system: S1 & S2 heard, RRR. No JVD, murmurs, rubs, gallops or clicks. No pedal edema. Gastrointestinal system: Abdomen is nondistended, soft and nontender. No organomegaly or masses felt. Normal bowel sounds heard. Central nervous system: Alert and oriented. No focal neurological deficits. Extremities: Symmetric 5 x 5 power. Skin: No rashes,  lesions or ulcers Psychiatry: Judgement and insight appear normal. Mood & affect appropriate.     Data Reviewed: I have personally reviewed following labs and imaging studies  CBC: Recent Labs  Lab 03/27/20 1940 03/27/20 2134 03/28/20 0426  WBC 11.0*  --  10.1  NEUTROABS 8.3*  --  7.7  HGB 5.8* 10.5* 10.0*  HCT 18.0* 34.5* 32.6*  MCV 77.3*  --  76.9*  PLT 259  --  443   Basic Metabolic Panel: Recent Labs  Lab 03/27/20 1940 03/28/20 0426 03/29/20 0343  NA 131* 132* 133*  K 3.8 3.9 3.8  CL 98 99 98  CO2 24 26 27   GLUCOSE 245* 272* 196*  BUN 15 15 17   CREATININE 1.05* 1.12* 1.21*  CALCIUM 8.5* 8.1* 8.3*  MG  --  1.7 1.8   GFR: Estimated Creatinine Clearance: 71.6 mL/min (A) (by C-G formula based on SCr of 1.21 mg/dL (H)). Liver Function Tests: Recent Labs  Lab 03/27/20 1940 03/28/20 0426  AST 16 15  ALT 12 12  ALKPHOS 72 62  BILITOT 0.6 0.8  PROT 7.1 6.6  ALBUMIN 3.3* 2.9*   No results for input(s): LIPASE, AMYLASE in the last 168 hours. No results for input(s): AMMONIA in the last 168 hours. Coagulation Profile: No results for input(s): INR, PROTIME in the last 168 hours. Cardiac Enzymes: No results for input(s): CKTOTAL, CKMB, CKMBINDEX, TROPONINI in the last 168 hours. BNP (last 3 results) No results for input(s): PROBNP in the last 8760 hours. HbA1C: Recent Labs    03/28/20 0427  HGBA1C 10.6*   CBG: Recent Labs  Lab 03/28/20 1657 03/28/20 2146 03/29/20 0801 03/29/20 1144 03/29/20 1528  GLUCAP 222* 234* 136* 143* 204*   Lipid Profile: No results for input(s): CHOL, HDL, LDLCALC, TRIG, CHOLHDL, LDLDIRECT in the last 72 hours. Thyroid Function Tests: Recent Labs    03/28/20 0427  TSH 0.354   Anemia Panel: Recent Labs    03/27/20 2133 03/27/20 2134  VITAMINB12 337  --   FOLATE  --  11.6  FERRITIN 29  --   TIBC 353  --   IRON 30  --   RETICCTPCT  --  1.9   Sepsis Labs: No results for input(s): PROCALCITON, LATICACIDVEN in the  last 168 hours.  Recent Results (from the past 240 hour(s))  Respiratory Panel by RT PCR (Flu A&B, Covid) - Nasopharyngeal Swab     Status: None   Collection Time: 03/27/20  6:03 PM   Specimen: Nasopharyngeal Swab  Result Value Ref Range Status   SARS Coronavirus 2 by RT PCR NEGATIVE NEGATIVE Final    Comment: (NOTE) SARS-CoV-2 target nucleic acids are NOT DETECTED.  The SARS-CoV-2 RNA is generally detectable in upper respiratoy specimens during the acute phase of infection. The lowest concentration of SARS-CoV-2 viral copies this assay can detect is 131 copies/mL. A negative result does not preclude SARS-Cov-2 infection and should not be used as the sole basis for treatment or other patient management decisions. A negative result may occur with  improper specimen collection/handling, submission of specimen other than nasopharyngeal  swab, presence of viral mutation(s) within the areas targeted by this assay, and inadequate number of viral copies (<131 copies/mL). A negative result must be combined with clinical observations, patient history, and epidemiological information. The expected result is Negative.  Fact Sheet for Patients:  PinkCheek.be  Fact Sheet for Healthcare Providers:  GravelBags.it  This test is no t yet approved or cleared by the [place] FDA and  has been authorized for detection and/or diagnosis of SARS-CoV-2 by FDA under an Emergency Use Authorization (EUA). This EUA will remain  in effect (meaning this test can be used) for the duration of the COVID-19 declaration under Section 564(b)(1) of the Act, 21 U.S.C. section 360bbb-3(b)(1), unless the authorization is terminated or revoked sooner.     Influenza A by PCR NEGATIVE NEGATIVE Final   Influenza B by PCR NEGATIVE NEGATIVE Final    Comment: (NOTE) The Xpert Xpress SARS-CoV-2/FLU/RSV assay is intended as an aid in  the diagnosis of influenza  from Nasopharyngeal swab specimens and  should not be used as a sole basis for treatment. Nasal washings and  aspirates are unacceptable for Xpert Xpress SARS-CoV-2/FLU/RSV  testing.  Fact Sheet for Patients: PinkCheek.be  Fact Sheet for Healthcare Providers: GravelBags.it  This test is not yet approved or cleared by the [place] FDA and  has been authorized for detection and/or diagnosis of SARS-CoV-2 by  FDA under an Emergency Use Authorization (EUA). This EUA will remain  in effect (meaning this test can be used) for the duration of the  Covid-19 declaration under Section 564(b)(1) of the Act, 21  U.S.C. section 360bbb-3(b)(1), unless the authorization is  terminated or revoked. Performed at [facility], [address] [place], [place] [address]   MRSA PCR Screening     Status: Abnormal   Collection Time: 03/29/20  7:43 AM   Specimen: Nasopharyngeal  Result Value Ref Range Status   MRSA by PCR POSITIVE (A) NEGATIVE Final    Comment:        The GeneXpert MRSA Assay (FDA approved for NASAL specimens only), is one component of a comprehensive MRSA colonization surveillance program. It is not intended to diagnose MRSA infection nor to guide or monitor treatment for MRSA infections. RESULT CALLED TO, READ BACK BY AND VERIFIED WITH: HILTON,L @ 0923 ON 03/29/20 BY JUW Performed at [facility], [address], [place], [place] [address]          Radiology Studies: [facility] Chest [facility] 1 View  Result Date: 03/28/2020 CLINICAL DATA:  Cough. EXAM: PORTABLE CHEST 1 VIEW COMPARISON:  03/27/2020 FINDINGS: The heart is enlarged but appears stable. Moderate central vascular congestion. Lower lung volumes with patchy bibasilar infiltrates suspected. No pleural effusions. The bony thorax is intact. IMPRESSION: 1. Cardiac enlargement with moderate central vascular congestion. 2. Bibasilar infiltrates. Electronically Signed   By:  Marijo Sanes M.D.   On: 03/28/2020 07:13   ECHOCARDIOGRAM COMPLETE  Result Date: 03/28/2020    ECHOCARDIOGRAM REPORT   Patient Name:   Chelsey Santiago Date of Exam: 03/28/2020 Medical Rec #:  [id number]       Height:       66.0 in Accession #:    [id number]      Weight:       225.0 lb Date of Birth:  1975/12/03       BSA:          2.102 m Patient Age:    55 years        BP:  134/69 mmHg Patient Gender: F               HR:           84 bpm. Exam Location:  [facility] Procedure: 2D Echo Indications:    Congestive Heart Failure 428.0 / I50.9  History:        Patient has prior history of Echocardiogram examinations, most                 recent 10/28/2019. CHF, Previous Myocardial Infarction,                 Signs/Symptoms:Chest Pain and Dyspnea; Risk Factors:Non-Smoker,                 Diabetes and Hypertension.  Sonographer:    Leavy Cella RDCS (AE) Referring Phys: [id number] ASIA B [place]  1. Left ventricular ejection fraction, by estimation, is 30 to 35%. The left ventricle has moderately decreased function. The left ventricle demonstrates global hypokinesis. There is moderate left ventricular hypertrophy. Left ventricular diastolic parameters are consistent with Grade II diastolic dysfunction (pseudonormalization).  2. Right ventricular systolic function is normal. The right ventricular size is normal. There is normal pulmonary artery systolic pressure. The estimated right ventricular systolic pressure is 78.4 mmHg.  3. Left atrial size was moderately dilated.  4. The mitral valve is grossly normal. Trivial mitral valve regurgitation.  5. The aortic valve is tricuspid. There is mild calcification of the aortic valve. Aortic valve regurgitation is not visualized. Mild aortic valve sclerosis is present, with no evidence of aortic valve stenosis.  6. The inferior vena cava is normal in size with greater than 50% respiratory variability, suggesting right atrial pressure of 3 mmHg.  FINDINGS  Left Ventricle: Left ventricular ejection fraction, by estimation, is 30 to 35%. The left ventricle has moderately decreased function. The left ventricle demonstrates global hypokinesis. The left ventricular internal cavity size was normal in size. There is moderate left ventricular hypertrophy. Left ventricular diastolic parameters are consistent with Grade II diastolic dysfunction (pseudonormalization). Right Ventricle: The right ventricular size is normal. No increase in right ventricular wall thickness. Right ventricular systolic function is normal. There is normal pulmonary artery systolic pressure. The tricuspid regurgitant velocity is 2.24 m/s, and  with an assumed right atrial pressure of 3 mmHg, the estimated right ventricular systolic pressure is 69.6 mmHg. Left Atrium: Left atrial size was moderately dilated. Right Atrium: Right atrial size was normal in size. Pericardium: The pericardium was not well visualized. Mitral Valve: The mitral valve is grossly normal. Mild to moderate mitral annular calcification. Trivial mitral valve regurgitation. Tricuspid Valve: The tricuspid valve is grossly normal. Tricuspid valve regurgitation is trivial. Aortic Valve: The aortic valve is tricuspid. There is mild calcification of the aortic valve. There is mild to moderate aortic valve annular calcification. Aortic valve regurgitation is not visualized. Mild aortic valve sclerosis is present, with no evidence of aortic valve stenosis. Pulmonic Valve: The pulmonic valve was grossly normal. Pulmonic valve regurgitation is trivial. Aorta: The aortic root is normal in size and structure. Venous: The inferior vena cava is normal in size with greater than 50% respiratory variability, suggesting right atrial pressure of 3 mmHg. IAS/Shunts: No atrial level shunt detected by color flow Doppler.  LEFT VENTRICLE PLAX 2D LVIDd:         5.35 cm  Diastology LVIDs:         4.55 cm  LV e' medial:    4.33 cm/s LV PW:  1.69 cm  LV E/e' medial:  25.9 LV IVS:        1.33 cm  LV e' lateral:   4.05 cm/s LVOT diam:     1.90 cm  LV E/e' lateral: 27.7 LVOT Area:     2.84 cm  RIGHT VENTRICLE RV S prime:     11.90 cm/s TAPSE (M-mode): 2.6 cm LEFT ATRIUM             Index       RIGHT ATRIUM           Index LA diam:        5.00 cm 2.38 cm/m  RA Area:     13.40 cm LA Vol (A2C):   96.3 ml 45.80 ml/m RA Volume:   28.50 ml  13.56 ml/m LA Vol (A4C):   89.5 ml 42.57 ml/m LA Biplane Vol: 93.6 ml 44.52 ml/m   AORTA Ao Root diam: 3.00 cm MITRAL VALVE                TRICUSPID VALVE MV Area (PHT): 3.81 cm     TR Peak grad:   20.1 mmHg MV Decel Time: 199 msec     TR Vmax:        224.00 cm/s MV E velocity: 112.00 cm/s MV A velocity: 56.60 cm/s   SHUNTS MV E/A ratio:  1.98         Systemic Diam: 1.90 cm Rozann Lesches MD Electronically signed by Rozann Lesches MD Signature Date/Time: 03/28/2020/1:05:19 PM    Final         Scheduled Meds: . sodium chloride   Intravenous Once  . amLODipine  10 mg Oral Daily  . amoxicillin-clavulanate  1 tablet Oral Q12H  . ARIPiprazole  10 mg Oral Daily  . aspirin EC  81 mg Oral Daily  . carvedilol  50 mg Oral BID WC  . Chlorhexidine Gluconate Cloth  6 each Topical Daily  . clonazePAM  0.5 mg Oral BID  . dextromethorphan-guaiFENesin  1 tablet Oral BID  . furosemide  40 mg Intravenous BID  . gabapentin  600 mg Oral TID  . heparin  5,000 Units Subcutaneous Q8H  . hydrALAZINE  100 mg Oral TID  . insulin aspart  0-20 Units Subcutaneous TID WC  . insulin aspart  0-5 Units Subcutaneous QHS  . insulin aspart  4 Units Subcutaneous TID WC  . insulin glargine  30 Units Subcutaneous QHS  . ipratropium-albuterol  3 mL Nebulization Q6H  . mometasone-formoterol  2 puff Inhalation BID  . mupirocin ointment   Nasal BID  . pyridostigmine  60 mg Oral Q8H  . sacubitril-valsartan  1 tablet Oral BID  . traZODone  200 mg Oral QHS   Continuous Infusions: . nitroGLYCERIN Stopped (03/28/20 0331)      LOS: 2 days    Time spent: 51mins    Kathie Dike, MD [facility]   If 7PM-7AM, please contact night-coverage [email or web address]  03/29/2020, 6:39 PM

## 2020-03-29 NOTE — ED Notes (Signed)
Patient taken to unit at this time. Personal belongings kept with patient.

## 2020-03-29 NOTE — Progress Notes (Signed)
Lab called positive nasal MRSA swab.

## 2020-03-30 DIAGNOSIS — E1165 Type 2 diabetes mellitus with hyperglycemia: Secondary | ICD-10-CM

## 2020-03-30 LAB — CBC
HCT: 36.6 % (ref 36.0–46.0)
Hemoglobin: 11.2 g/dL — ABNORMAL LOW (ref 12.0–15.0)
MCH: 23 pg — ABNORMAL LOW (ref 26.0–34.0)
MCHC: 30.6 g/dL (ref 30.0–36.0)
MCV: 75.3 fL — ABNORMAL LOW (ref 80.0–100.0)
Platelets: 236 10*3/uL (ref 150–400)
RBC: 4.86 MIL/uL (ref 3.87–5.11)
RDW: 14.6 % (ref 11.5–15.5)
WBC: 6.6 10*3/uL (ref 4.0–10.5)
nRBC: 0 % (ref 0.0–0.2)

## 2020-03-30 LAB — GLUCOSE, CAPILLARY
Glucose-Capillary: 263 mg/dL — ABNORMAL HIGH (ref 70–99)
Glucose-Capillary: 358 mg/dL — ABNORMAL HIGH (ref 70–99)

## 2020-03-30 LAB — BASIC METABOLIC PANEL
Anion gap: 10 (ref 5–15)
BUN: 28 mg/dL — ABNORMAL HIGH (ref 6–20)
CO2: 23 mmol/L (ref 22–32)
Calcium: 8.4 mg/dL — ABNORMAL LOW (ref 8.9–10.3)
Chloride: 98 mmol/L (ref 98–111)
Creatinine, Ser: 1.44 mg/dL — ABNORMAL HIGH (ref 0.44–1.00)
GFR, Estimated: 46 mL/min — ABNORMAL LOW (ref >60)
Glucose, Bld: 250 mg/dL — ABNORMAL HIGH (ref 70–99)
Potassium: 4.1 mmol/L (ref 3.5–5.1)
Sodium: 131 mmol/L — ABNORMAL LOW (ref 135–145)

## 2020-03-30 MED ORDER — ALBUTEROL SULFATE HFA 108 (90 BASE) MCG/ACT IN AERS: 1.0000 | INHALATION_SPRAY | Freq: Four times a day (QID) | RESPIRATORY_TRACT | 0 refills | Status: AC | PRN

## 2020-03-30 MED ORDER — DM-GUAIFENESIN ER 30-600 MG PO TB12
1.0000 | ORAL_TABLET | Freq: Two times a day (BID) | ORAL | 0 refills | Status: DC
Start: 2020-03-30 — End: 2020-09-13

## 2020-03-30 MED ORDER — PROMETHAZINE-CODEINE 6.25-10 MG/5ML PO SYRP
5.0000 mL | ORAL_SOLUTION | ORAL | 0 refills | Status: DC | PRN
Start: 2020-03-30 — End: 2020-06-19

## 2020-03-30 MED ORDER — PREDNISONE 10 MG PO TABS
ORAL_TABLET | ORAL | 0 refills | Status: DC
Start: 2020-03-30 — End: 2020-06-11

## 2020-03-30 MED ORDER — AMOXICILLIN-POT CLAVULANATE 875-125 MG PO TABS
1.0000 | ORAL_TABLET | Freq: Two times a day (BID) | ORAL | 0 refills | Status: DC
Start: 2020-03-30 — End: 2020-06-11

## 2020-03-30 MED ORDER — TORSEMIDE 20 MG PO TABS: 80.0000 mg | ORAL_TABLET | Freq: Every day | ORAL | 11 refills | Status: DC

## 2020-03-30 MED ORDER — BUDESONIDE-FORMOTEROL FUMARATE 80-4.5 MCG/ACT IN AERO: 2.0000 | INHALATION_SPRAY | Freq: Two times a day (BID) | RESPIRATORY_TRACT | 0 refills | Status: DC

## 2020-03-30 NOTE — Progress Notes (Signed)
Patient placed on Bipap by her request. Stayed on for about 2 hrs.

## 2020-03-30 NOTE — Progress Notes (Signed)
Patient discharged to home. Discharge AVS given and explained to patient. Durable equipment brought patient oxygen tank equipment to take home with her. IV access removed and patient dressed herself.

## 2020-03-30 NOTE — Discharge Summary (Signed)
Physician Discharge Summary  Chelsey Santiago ZWC:[id number] DOB: Sep 30, 1975 DOA: 03/27/2020  PCP: Rosita Fire, MD  Admit date: 03/27/2020 Discharge date: 03/30/2020  Admitted From: Home Disposition: Home  Recommendations for Outpatient Follow-up:  1. Follow up with PCP in 1-2 weeks 2. Please obtain BMP/CBC in one week 3. Follow-up with cardiology in the next 2 weeks 4. Referral made for outpatient pulmonology for pulmonary function test as well sleep study  Home Health: Equipment/Devices: Oxygen at 2 L  Discharge Condition: Stable CODE STATUS: Full code Diet recommendation: Heart healthy, carb modified  Brief/Interim Summary: 44 year old female with a history of chronic combined CHF, diabetes, hypertension, obstructive sleep apnea, presents to the emergency room with progressive shortness of breath.  Found to have acute respiratory failure needing BiPAP.  She is felt to have decompensated CHF and hypertensive emergency with blood pressure over 200.  She was started on BiPAP, nitroglycerin infusion and referred for admission.  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 (HCC)   Hypertensive emergency   Diabetes mellitus with complication (HCC)   Uncontrolled type 2 diabetes mellitus with hyperglycemia (HCC)   Flash pulmonary edema (HCC)   Acute respiratory failure with hypoxia (HCC)   Obstructive sleep apnea  Acute respiratory failure with hypoxia -Secondary to decompensated CHF -He also have an element of bronchitis -She was briefly on BiPAP, but has since been weaned down to nasal cannula -Overall respiratory status has improved -She becomes mildly hypoxic on ambulation and will be discharged home with supplemental oxygen  Acute on chronic combined CHF -Ejection fraction of 30 to 35% with grade 2 diastolic dysfunction on echocardiogram -She was noted to have interstitial edema on  imaging -She was treated with IV Lasix with improvement of volume status -She reports being prescribed torsemide 80 mg daily at home -Appreciate cardiology assistance -Currently on beta-blockers, Entresto -She reports that she has all her cardiac meds at home -Advised on medication compliance  Hypertensive emergency -Presented with a blood pressure over 200 and evidence of heart failure -Briefly on nitro drip which has since been discontinued -On multiple antihypertensives including Coreg, hydralazine, amlodipine, Entresto -Home regimen was resumed on admission with improvement of blood pressures -This will indicate possibly some degree of noncompliance -Counseled on the importance of medication compliance  Obstructive sleep apnea -She reports that her CPAP machine has been nonfunctional for quite some time now -Will need repeat sleep study as an outpatient -Referral made to pulmonology  Type 2 diabetes -On Lantus and sliding scale insulin -A1c 10.6 -Blood sugars currently stable  History of orthostatic hypotension -Chronically on pyridostigmine  Discharge Instructions  Discharge Instructions    Ambulatory referral to Pulmonology   Complete by: As directed    Sleep apnea, possible asthma/copd   Reason for referral: Other   Diet - low sodium heart healthy   Complete by: As directed    Increase activity slowly   Complete by: As directed      Allergies as of 03/30/2020      Reactions   Diclofenac Swelling   AND POSSIBLE SYNCOPE; tolerates ibuprofen per pt   Tramadol Nausea And Vomiting, Nausea Only   Itching (12/21); tolerates ibuprofen per pt   Vicodin [hydrocodone-acetaminophen] Itching, Nausea Only      Medication List    STOP taking these medications   fluconazole 150 MG tablet Commonly known as: DIFLUCAN   ibuprofen 800 MG tablet Commonly known as: ADVIL  metroNIDAZOLE 500 MG tablet Commonly known as: FLAGYL     TAKE these medications    albuterol 108 (90 Base) MCG/ACT inhaler Commonly known as: VENTOLIN HFA Inhale 1 puff into the lungs every 6 (six) hours as needed for wheezing or shortness of breath.   amLODipine 10 MG tablet Commonly known as: NORVASC Take 1 tablet (10 mg total) by mouth daily.   amoxicillin-clavulanate 875-125 MG tablet Commonly known as: AUGMENTIN Take 1 tablet by mouth every 12 (twelve) hours.   ARIPiprazole 10 MG tablet Commonly known as: ABILIFY Take 1 tablet (10 mg total) by mouth daily.   aspirin EC 81 MG tablet Take 81 mg by mouth daily.   budesonide-formoterol 80-4.5 MCG/ACT inhaler Commonly known as: Symbicort Inhale 2 puffs into the lungs 2 (two) times daily.   carvedilol 25 MG tablet Commonly known as: COREG Take 2 tablets (50 mg total) by mouth 2 (two) times daily with a meal.   clonazePAM 0.5 MG tablet Commonly known as: KLONOPIN Take 0.5 mg by mouth 2 (two) times daily.   dextromethorphan-guaiFENesin 30-600 MG 12hr tablet Commonly known as: MUCINEX DM Take 1 tablet by mouth 2 (two) times daily.   Entresto 97-103 MG Generic drug: sacubitril-valsartan Take 1 tablet by mouth 2 (two) times daily.   gabapentin 600 MG tablet Commonly known as: NEURONTIN Take 600 mg by mouth 3 (three) times daily.   hydrALAZINE 100 MG tablet Commonly known as: APRESOLINE Take 1 tablet (100 mg total) by mouth 3 (three) times daily.   insulin glargine 100 UNIT/ML injection Commonly known as: LANTUS Inject 30 Units at bedtime into the skin.   insulin lispro 100 UNIT/ML injection Commonly known as: HUMALOG Inject 15 Units into the skin 3 (three) times daily before meals.   Ipratropium-Albuterol 20-100 MCG/ACT Aers respimat Commonly known as: Combivent Respimat Inhale 1 puff into the lungs every 6 (six) hours as needed for wheezing or shortness of breath.   linaclotide 145 MCG Caps capsule Commonly known as: Linzess Take 1 capsule (145 mcg total) by mouth daily before breakfast.    nitroGLYCERIN 0.4 MG SL tablet Commonly known as: Nitrostat Place 1 tablet (0.4 mg total) under the tongue every 5 (five) minutes as needed for chest pain.   ondansetron 8 MG disintegrating tablet Commonly known as: Zofran ODT Take 1 tablet (8 mg total) by mouth every 8 (eight) hours as needed for nausea or vomiting.   potassium chloride SA 20 MEQ tablet Commonly known as: Klor-Con M20 Take 2 tablets (40 mEq total) by mouth daily.   potassium chloride SA 20 MEQ tablet Commonly known as: KLOR-CON Take 1 tablet (20 mEq total) by mouth 2 (two) times daily.   predniSONE 10 MG tablet Commonly known as: DELTASONE Take 40mg  po daily for 2 days then 30mg  daily for 2 days then 20mg  daily for 2 days then 10mg  daily for 2 days then stop   promethazine-codeine 6.25-10 MG/5ML syrup Commonly known as: PHENERGAN with CODEINE Take 5 mLs by mouth every 4 (four) hours as needed for cough.   pyridostigmine 60 MG tablet Commonly known as: MESTINON Take 60 mg by mouth every 8 (eight) hours.   torsemide 20 MG tablet Commonly known as: DEMADEX Take 4 tablets (80 mg total) by mouth daily. What changed: how much to take   traZODone 100 MG tablet Commonly known as: DESYREL Take 2 tablets (200 mg total) by mouth at bedtime.   triamcinolone ointment 0.5 % Commonly known as: KENALOG Apply 1 application topically  2 (two) times daily.            Durable Medical Equipment  (From admission, onward)         Start     Ordered   03/30/20 1047  For home use only DME oxygen  Once       Question Answer Comment  Length of Need 6 Months   Mode or (Route) Nasal cannula   Liters per Minute 2   Frequency Continuous (stationary and portable oxygen unit needed)   Oxygen conserving device Yes   Oxygen delivery system Gas      03/30/20 1046          Follow-up [place], [facility] Follow up.   Why: O2 Contact information: [address]  [address] [phone]        [facility] Follow up.   Why: They will call you with appointment for sleep study and pulmonary function test follow up       Rosita Fire, MD. Schedule an appointment as soon as possible for a visit in 2 week(s).   Specialty: Internal Medicine Contact information: [place] [address] [phone]        Arnoldo Lenis, MD. Schedule an appointment as soon as possible for a visit in 2 week(s).   Specialty: Cardiology Contact information: [place] [address] [phone]              Allergies  Allergen Reactions  . Diclofenac Swelling    AND POSSIBLE SYNCOPE; tolerates ibuprofen per pt  . Tramadol Nausea And Vomiting and Nausea Only    Itching (12/21); tolerates ibuprofen per pt  . Vicodin [Hydrocodone-Acetaminophen] Itching and Nausea Only    Consultations:  Cardiology   Procedures/Studies: DG Chest 2 View  Result Date: 03/12/2020 CLINICAL DATA:  Left shoulder pain EXAM: CHEST - 2 VIEW COMPARISON:  02/06/2020 FINDINGS: Cardiomegaly. Both lungs are clear. The visualized skeletal structures are unremarkable. IMPRESSION: Cardiomegaly without acute abnormality of the lungs. Electronically Signed   By: Eddie Candle M.D.   On: 03/12/2020 08:11   CT Head Wo Contrast  Result Date: 03/12/2020 CLINICAL DATA:  Headache, intracranial hemorrhage suspected. EXAM: CT HEAD WITHOUT CONTRAST TECHNIQUE: Contiguous axial images were obtained from the base of the skull through the vertex without intravenous contrast. COMPARISON:  None. FINDINGS: Brain: No evidence of acute large vascular territory infarction, hemorrhage, hydrocephalus, extra-axial collection or mass lesion/mass effect. Minimal periventricular hypoattenuation, most likely the sequela of chronic microvascular ischemic disease. Vascular: No hyperdense vessel or unexpected calcification. Skull: Normal. Negative for  fracture or focal lesion. Sinuses/Orbits: The visualized sinuses are clear. Unremarkable orbits. Other: No mastoid effusions. IMPRESSION: No evidence of acute intracranial abnormality. Electronically Signed   By: Margaretha Sheffield MD   On: 03/12/2020 12:09   DG Chest Port 1 View  Result Date: 03/28/2020 CLINICAL DATA:  Cough. EXAM: PORTABLE CHEST 1 VIEW COMPARISON:  03/27/2020 FINDINGS: The heart is enlarged but appears stable. Moderate central vascular congestion. Lower lung volumes with patchy bibasilar infiltrates suspected. No pleural effusions. The bony thorax is intact. IMPRESSION: 1. Cardiac enlargement with moderate central vascular congestion. 2. Bibasilar infiltrates. Electronically Signed   By: Marijo Sanes M.D.   On: 03/28/2020 07:13   DG Chest Portable 1 View  Result Date: 03/27/2020 CLINICAL DATA:  44 year old female with shortness of breath. EXAM: PORTABLE CHEST 1 VIEW COMPARISON:  Chest radiograph dated 03/12/2020.  FINDINGS: There is cardiomegaly with vascular congestion. No focal consolidation, pleural effusion or pneumothorax. No acute osseous pathology. IMPRESSION: Cardiomegaly with vascular congestion. No focal consolidation. Electronically Signed   By: Anner Crete M.D.   On: 03/27/2020 18:47   ECHOCARDIOGRAM COMPLETE  Result Date: 03/28/2020    ECHOCARDIOGRAM REPORT   Patient Name:   Chelsey Santiago Date of Exam: 03/28/2020 Medical Rec #:  [id number]       Height:       66.0 in Accession #:    [id number]      Weight:       225.0 lb Date of Birth:  05/03/1976       BSA:          2.102 m Patient Age:    82 years        BP:           134/69 mmHg Patient Gender: F               HR:           84 bpm. Exam Location:  [facility] Procedure: 2D Echo Indications:    Congestive Heart Failure 428.0 / I50.9  History:        Patient has prior history of Echocardiogram examinations, most                 recent 10/28/2019. CHF, Previous Myocardial Infarction,                  Signs/Symptoms:Chest Pain and Dyspnea; Risk Factors:Non-Smoker,                 Diabetes and Hypertension.  Sonographer:    Leavy Cella RDCS (AE) Referring Phys: [id number] ASIA B [place]  1. Left ventricular ejection fraction, by estimation, is 30 to 35%. The left ventricle has moderately decreased function. The left ventricle demonstrates global hypokinesis. There is moderate left ventricular hypertrophy. Left ventricular diastolic parameters are consistent with Grade II diastolic dysfunction (pseudonormalization).  2. Right ventricular systolic function is normal. The right ventricular size is normal. There is normal pulmonary artery systolic pressure. The estimated right ventricular systolic pressure is 53.2 mmHg.  3. Left atrial size was moderately dilated.  4. The mitral valve is grossly normal. Trivial mitral valve regurgitation.  5. The aortic valve is tricuspid. There is mild calcification of the aortic valve. Aortic valve regurgitation is not visualized. Mild aortic valve sclerosis is present, with no evidence of aortic valve stenosis.  6. The inferior vena cava is normal in size with greater than 50% respiratory variability, suggesting right atrial pressure of 3 mmHg. FINDINGS  Left Ventricle: Left ventricular ejection fraction, by estimation, is 30 to 35%. The left ventricle has moderately decreased function. The left ventricle demonstrates global hypokinesis. The left ventricular internal cavity size was normal in size. There is moderate left ventricular hypertrophy. Left ventricular diastolic parameters are consistent with Grade II diastolic dysfunction (pseudonormalization). Right Ventricle: The right ventricular size is normal. No increase in right ventricular wall thickness. Right ventricular systolic function is normal. There is normal pulmonary artery systolic pressure. The tricuspid regurgitant velocity is 2.24 m/s, and  with an assumed right atrial pressure of 3 mmHg, the  estimated right ventricular systolic pressure is 99.2 mmHg. Left Atrium: Left atrial size was moderately dilated. Right Atrium: Right atrial size was normal in size. Pericardium: The pericardium was not well visualized. Mitral Valve: The mitral valve is grossly normal. Mild to moderate mitral annular calcification. Trivial  mitral valve regurgitation. Tricuspid Valve: The tricuspid valve is grossly normal. Tricuspid valve regurgitation is trivial. Aortic Valve: The aortic valve is tricuspid. There is mild calcification of the aortic valve. There is mild to moderate aortic valve annular calcification. Aortic valve regurgitation is not visualized. Mild aortic valve sclerosis is present, with no evidence of aortic valve stenosis. Pulmonic Valve: The pulmonic valve was grossly normal. Pulmonic valve regurgitation is trivial. Aorta: The aortic root is normal in size and structure. Venous: The inferior vena cava is normal in size with greater than 50% respiratory variability, suggesting right atrial pressure of 3 mmHg. IAS/Shunts: No atrial level shunt detected by color flow Doppler.  LEFT VENTRICLE PLAX 2D LVIDd:         5.35 cm  Diastology LVIDs:         4.55 cm  LV e' medial:    4.33 cm/s LV PW:         1.69 cm  LV E/e' medial:  25.9 LV IVS:        1.33 cm  LV e' lateral:   4.05 cm/s LVOT diam:     1.90 cm  LV E/e' lateral: 27.7 LVOT Area:     2.84 cm  RIGHT VENTRICLE RV S prime:     11.90 cm/s TAPSE (M-mode): 2.6 cm LEFT ATRIUM             Index       RIGHT ATRIUM           Index LA diam:        5.00 cm 2.38 cm/m  RA Area:     13.40 cm LA Vol (A2C):   96.3 ml 45.80 ml/m RA Volume:   28.50 ml  13.56 ml/m LA Vol (A4C):   89.5 ml 42.57 ml/m LA Biplane Vol: 93.6 ml 44.52 ml/m   AORTA Ao Root diam: 3.00 cm MITRAL VALVE                TRICUSPID VALVE MV Area (PHT): 3.81 cm     TR Peak grad:   20.1 mmHg MV Decel Time: 199 msec     TR Vmax:        224.00 cm/s MV E velocity: 112.00 cm/s MV A velocity: 56.60 cm/s    SHUNTS MV E/A ratio:  1.98         Systemic Diam: 1.90 cm Rozann Lesches MD Electronically signed by Rozann Lesches MD Signature Date/Time: 03/28/2020/1:05:19 PM    Final        Subjective: Wheezing has resolved overall shortness of breath has improved still has mild cough.  Discharge Exam: Vitals:   03/30/20 0900 03/30/20 1000 03/30/20 1100 03/30/20 1220  BP:      Pulse: 63 67    Resp: 18 (!) 22 (!) 26   Temp:    (!) 97.4 F (36.3 C)  TempSrc:    Oral  SpO2: 96% 98%    Weight:      Height:        General: Pt is alert, awake, not in acute distress Cardiovascular: RRR, S1/S2 +, no rubs, no gallops Respiratory: CTA bilaterally, no wheezing, no rhonchi Abdominal: Soft, NT, ND, bowel sounds + Extremities: no edema, no cyanosis    The results of significant diagnostics from this hospitalization (including imaging, microbiology, ancillary and laboratory) are listed below for reference.     Microbiology: Recent Results (from the past 240 hour(s))  Respiratory Panel by RT PCR (Flu A&B, Covid) - Nasopharyngeal  Swab     Status: None   Collection Time: 03/27/20  6:03 PM   Specimen: Nasopharyngeal Swab  Result Value Ref Range Status   SARS Coronavirus 2 by RT PCR NEGATIVE NEGATIVE Final    Comment: (NOTE) SARS-CoV-2 target nucleic acids are NOT DETECTED.  The SARS-CoV-2 RNA is generally detectable in upper respiratoy specimens during the acute phase of infection. The lowest concentration of SARS-CoV-2 viral copies this assay can detect is 131 copies/mL. A negative result does not preclude SARS-Cov-2 infection and should not be used as the sole basis for treatment or other patient management decisions. A negative result may occur with  improper specimen collection/handling, submission of specimen other than nasopharyngeal swab, presence of viral mutation(s) within the areas targeted by this assay, and inadequate number of viral copies (<131 copies/mL). A negative result must  be combined with clinical observations, patient history, and epidemiological information. The expected result is Negative.  Fact Sheet for Patients:  PinkCheek.be  Fact Sheet for Healthcare Providers:  GravelBags.it  This test is no t yet approved or cleared by the [place] FDA and  has been authorized for detection and/or diagnosis of SARS-CoV-2 by FDA under an Emergency Use Authorization (EUA). This EUA will remain  in effect (meaning this test can be used) for the duration of the COVID-19 declaration under Section 564(b)(1) of the Act, 21 U.S.C. section 360bbb-3(b)(1), unless the authorization is terminated or revoked sooner.     Influenza A by PCR NEGATIVE NEGATIVE Final   Influenza B by PCR NEGATIVE NEGATIVE Final    Comment: (NOTE) The Xpert Xpress SARS-CoV-2/FLU/RSV assay is intended as an aid in  the diagnosis of influenza from Nasopharyngeal swab specimens and  should not be used as a sole basis for treatment. Nasal washings and  aspirates are unacceptable for Xpert Xpress SARS-CoV-2/FLU/RSV  testing.  Fact Sheet for Patients: PinkCheek.be  Fact Sheet for Healthcare Providers: GravelBags.it  This test is not yet approved or cleared by the [place] FDA and  has been authorized for detection and/or diagnosis of SARS-CoV-2 by  FDA under an Emergency Use Authorization (EUA). This EUA will remain  in effect (meaning this test can be used) for the duration of the  Covid-19 declaration under Section 564(b)(1) of the Act, 21  U.S.C. section 360bbb-3(b)(1), unless the authorization is  terminated or revoked. Performed at [facility], [address], [place], [place] [address]   MRSA PCR Screening     Status: Abnormal   Collection Time: 03/29/20  7:43 AM   Specimen: Nasopharyngeal  Result Value Ref Range Status   MRSA by PCR POSITIVE (A) NEGATIVE  Final    Comment:        The GeneXpert MRSA Assay (FDA approved for NASAL specimens only), is one component of a comprehensive MRSA colonization surveillance program. It is not intended to diagnose MRSA infection nor to guide or monitor treatment for MRSA infections. RESULT CALLED TO, READ BACK BY AND VERIFIED WITH: HILTON,L @ [phone] ON 03/29/20 BY JUW Performed at [facility], [address] [place],  [address]      Labs: BNP (last 3 results) Recent Labs    02/06/20 1720 03/12/20 0723 03/27/20 1940  BNP 261.0* 813.0* 295.1*   Basic Metabolic Panel: Recent Labs  Lab 03/27/20 1940 03/28/20 0426 03/29/20 0343 03/30/20 0543  NA 131* 132* 133* 131*  K 3.8 3.9 3.8 4.1  CL 98 99 98 98  CO2 24 26 27 23   GLUCOSE 245* 272*  196* 250*  BUN 15 15 17  28*  CREATININE 1.05* 1.12* 1.21* 1.44*  CALCIUM 8.5* 8.1* 8.3* 8.4*  MG  --  1.7 1.8  --    Liver Function Tests: Recent Labs  Lab 03/27/20 1940 03/28/20 0426  AST 16 15  ALT 12 12  ALKPHOS 72 62  BILITOT 0.6 0.8  PROT 7.1 6.6  ALBUMIN 3.3* 2.9*   No results for input(s): LIPASE, AMYLASE in the last 168 hours. No results for input(s): AMMONIA in the last 168 hours. CBC: Recent Labs  Lab 03/27/20 1940 03/27/20 2134 03/28/20 0426 03/30/20 0543  WBC 11.0*  --  10.1 6.6  NEUTROABS 8.3*  --  7.7  --   HGB 5.8* 10.5* 10.0* 11.2*  HCT 18.0* 34.5* 32.6* 36.6  MCV 77.3*  --  76.9* 75.3*  PLT 259  --  221 236   Cardiac Enzymes: No results for input(s): CKTOTAL, CKMB, CKMBINDEX, TROPONINI in the last 168 hours. BNP: Invalid input(s): POCBNP CBG: Recent Labs  Lab 03/29/20 1144 03/29/20 1528 03/29/20 2012 03/30/20 0743 03/30/20 1122  GLUCAP 143* 204* 202* 263* 358*   D-Dimer No results for input(s): DDIMER in the last 72 hours. Hgb A1c Recent Labs    03/28/20 0427  HGBA1C 10.6*   Lipid Profile No results for input(s): CHOL, HDL, LDLCALC, TRIG, CHOLHDL, LDLDIRECT in the last 72 hours. Thyroid  function studies Recent Labs    03/28/20 0427  TSH 0.354   Anemia work up Recent Labs    03/27/20 2133 03/27/20 2134  VITAMINB12 337  --   FOLATE  --  11.6  FERRITIN 29  --   TIBC 353  --   IRON 30  --   RETICCTPCT  --  1.9   Urinalysis    Component Value Date/Time   COLORURINE YELLOW 08/28/2019 1226   APPEARANCEUR CLEAR 08/28/2019 1226   LABSPEC 1.028 08/28/2019 1226   PHURINE 6.0 08/28/2019 1226   GLUCOSEU >=500 (A) 08/28/2019 1226   HGBUR NEGATIVE 08/28/2019 1226   BILIRUBINUR NEGATIVE 08/28/2019 1226   KETONESUR NEGATIVE 08/28/2019 1226   PROTEINUR Negative 12/19/2019 1010   PROTEINUR NEGATIVE 08/28/2019 1226   UROBILINOGEN 0.2 04/29/2014 1115   NITRITE n 12/19/2019 1010   NITRITE NEGATIVE 08/28/2019 1226   LEUKOCYTESUR Negative 12/19/2019 1010   LEUKOCYTESUR TRACE (A) 08/28/2019 1226   Sepsis Labs Invalid input(s): PROCALCITONIN,  WBC,  LACTICIDVEN Microbiology Recent Results (from the past 240 hour(s))  Respiratory Panel by RT PCR (Flu A&B, Covid) - Nasopharyngeal Swab     Status: None   Collection Time: 03/27/20  6:03 PM   Specimen: Nasopharyngeal Swab  Result Value Ref Range Status   SARS Coronavirus 2 by RT PCR NEGATIVE NEGATIVE Final    Comment: (NOTE) SARS-CoV-2 target nucleic acids are NOT DETECTED.  The SARS-CoV-2 RNA is generally detectable in upper respiratoy specimens during the acute phase of infection. The lowest concentration of SARS-CoV-2 viral copies this assay can detect is 131 copies/mL. A negative result does not preclude SARS-Cov-2 infection and should not be used as the sole basis for treatment or other patient management decisions. A negative result may occur with  improper specimen collection/handling, submission of specimen other than nasopharyngeal swab, presence of viral mutation(s) within the areas targeted by this assay, and inadequate number of viral copies (<131 copies/mL). A negative result must be combined with  clinical observations, patient history, and epidemiological information. The expected result is Negative.  Fact Sheet for Patients:  PinkCheek.be  Fact  Sheet for Healthcare Providers:  GravelBags.it  This test is no t yet approved or cleared by the [place] FDA and  has been authorized for detection and/or diagnosis of SARS-CoV-2 by FDA under an Emergency Use Authorization (EUA). This EUA will remain  in effect (meaning this test can be used) for the duration of the COVID-19 declaration under Section 564(b)(1) of the Act, 21 U.S.C. section 360bbb-3(b)(1), unless the authorization is terminated or revoked sooner.     Influenza A by PCR NEGATIVE NEGATIVE Final   Influenza B by PCR NEGATIVE NEGATIVE Final    Comment: (NOTE) The Xpert Xpress SARS-CoV-2/FLU/RSV assay is intended as an aid in  the diagnosis of influenza from Nasopharyngeal swab specimens and  should not be used as a sole basis for treatment. Nasal washings and  aspirates are unacceptable for Xpert Xpress SARS-CoV-2/FLU/RSV  testing.  Fact Sheet for Patients: PinkCheek.be  Fact Sheet for Healthcare Providers: GravelBags.it  This test is not yet approved or cleared by the [place] FDA and  has been authorized for detection and/or diagnosis of SARS-CoV-2 by  FDA under an Emergency Use Authorization (EUA). This EUA will remain  in effect (meaning this test can be used) for the duration of the  Covid-19 declaration under Section 564(b)(1) of the Act, 21  U.S.C. section 360bbb-3(b)(1), unless the authorization is  terminated or revoked. Performed at [facility], [address], [place], [place] [address]   MRSA PCR Screening     Status: Abnormal   Collection Time: 03/29/20  7:43 AM   Specimen: Nasopharyngeal  Result Value Ref Range Status   MRSA by PCR POSITIVE (A) NEGATIVE Final    Comment:         The GeneXpert MRSA Assay (FDA approved for NASAL specimens only), is one component of a comprehensive MRSA colonization surveillance program. It is not intended to diagnose MRSA infection nor to guide or monitor treatment for MRSA infections. RESULT CALLED TO, READ BACK BY AND VERIFIED WITH: HILTON,L @ [id number] ON 03/29/20 BY JUW Performed at [facility], [address] [place], [place] [address]      Time coordinating discharge: 58mins  SIGNED:   Kathie Dike, MD  [facility] 03/30/2020, 6:32 PM   If 7PM-7AM, please contact night-coverage [email or web address]

## 2020-03-30 NOTE — Progress Notes (Signed)
SATURATION QUALIFICATIONS: (This note is used to comply with regulatory documentation for home oxygen)  Patient Saturations on Room Air at Rest = 97  Patient Saturations on Room Air while Ambulating = 88  Patient Saturations on 0  Liters of oxygen while Ambulating = 0  Please briefly explain why patient needs home oxygen: Oxygen dropped to 87% while ambulating approximately 150 feet. Heart rate increased to 120 beats per minute. Heart rate dropped back to 82 beats per minute and oxygen saturation increased back to 97% room air sitting on side of bed.

## 2020-03-30 NOTE — TOC Transition Note (Addendum)
Transition of Care [facility]) - CM/SW Discharge Note   Patient Details  Name: Chelsey Santiago MRN: [id number] Date of Birth: [birth year]  Transition of [facility]) CM/SW Contact:  Natasha Bence, LCSW Phone Number: 03/30/2020, 10:52 AM   Clinical Narrative:    CSW received order for oxygen. CSW placed referral for O2 with Barbaraann Rondo from [place]. Barbaraann Rondo agreeable to provide O2 to patient. TOC signing off.  Addendum  CSW notified from MD of [facility] needs. CSW contacted patient to inquire about agreeableness for [facility] referral. Patient reiterated that they currently have an aid that comes 2hrs a day. Patient reported that she would like her aid to provide medication assistance and would consult PCP about extending hours with aid. CSW notified MD and nurse. Nurse agreeable to provide medication compliance instructions to patient and information on what to provide to PCP and aid to further assist with medications. TOC signing off.    Final next level of care: Home/Self Care Barriers to Discharge: Barriers Resolved   Patient Goals and CMS Choice Patient states their goals for this hospitalization and ongoing recovery are:: Return home with O2.   Choice offered to / list presented to : Patient  Discharge Placement                       Discharge Plan and Services In-house Referral: Clinical Social Work Discharge Planning Services: NA Post Acute Care Choice: NA          DME Arranged: Oxygen DME Agency: [employer] Date DME Agency Contacted: 03/30/20 Time DME Agency Contacted: 29 Representative spoke with at DME Agency: [place] ([place]) Interventions     Readmission Risk Interventions Readmission Risk Prevention Plan 03/28/2020  Transportation Screening Complete  HRI or [place] Complete  Social Work Consult for [place] Planning/Counseling [place] Not Applicable  Medication Review [employer])  Complete  Some recent data might be hidden

## 2020-03-31 LAB — TYPE AND SCREEN
ABO/RH(D): O NEG
Antibody Screen: NEGATIVE
Unit division: 0
Unit division: 0

## 2020-03-31 LAB — BPAM RBC
Blood Product Expiration Date: 202112122359
Blood Product Expiration Date: 202112162359
Unit Type and Rh: 9500
Unit Type and Rh: 9500

## 2020-04-02 ENCOUNTER — Encounter (HOSPITAL_COMMUNITY): Payer: Self-pay

## 2020-04-02 ENCOUNTER — Emergency Department (HOSPITAL_COMMUNITY): Payer: Medicaid Other

## 2020-04-02 ENCOUNTER — Other Ambulatory Visit: Payer: Self-pay

## 2020-04-02 ENCOUNTER — Emergency Department (HOSPITAL_COMMUNITY)
Admission: EM | Admit: 2020-04-02 | Discharge: 2020-04-02 | Disposition: A | Discharge status: Home / Self Care | Payer: Medicaid Other | Attending: Emergency Medicine | Admitting: Emergency Medicine

## 2020-04-02 DIAGNOSIS — R0602 Shortness of breath: Secondary | ICD-10-CM | POA: Insufficient documentation

## 2020-04-02 DIAGNOSIS — I161 Hypertensive emergency: Secondary | ICD-10-CM

## 2020-04-02 DIAGNOSIS — Z5321 Procedure and treatment not carried out due to patient leaving prior to being seen by health care provider: Secondary | ICD-10-CM | POA: Diagnosis not present

## 2020-04-02 LAB — BASIC METABOLIC PANEL
Anion gap: 10 (ref 5–15)
BUN: 16 mg/dL (ref 6–20)
CO2: 27 mmol/L (ref 22–32)
Calcium: 8.9 mg/dL (ref 8.9–10.3)
Chloride: 97 mmol/L — ABNORMAL LOW (ref 98–111)
Creatinine, Ser: 0.87 mg/dL (ref 0.44–1.00)
GFR, Estimated: >60 mL/min (ref >60)
Glucose, Bld: 303 mg/dL — ABNORMAL HIGH (ref 70–99)
Potassium: 3.5 mmol/L (ref 3.5–5.1)
Sodium: 134 mmol/L — ABNORMAL LOW (ref 135–145)

## 2020-04-02 LAB — CBC
HCT: 39.4 % (ref 36.0–46.0)
Hemoglobin: 12 g/dL (ref 12.0–15.0)
MCH: 23.3 pg — ABNORMAL LOW (ref 26.0–34.0)
MCHC: 30.5 g/dL (ref 30.0–36.0)
MCV: 76.5 fL — ABNORMAL LOW (ref 80.0–100.0)
Platelets: 262 10*3/uL (ref 150–400)
RBC: 5.15 MIL/uL — ABNORMAL HIGH (ref 3.87–5.11)
RDW: 14.7 % (ref 11.5–15.5)
WBC: 8.5 10*3/uL (ref 4.0–10.5)
nRBC: 0 % (ref 0.0–0.2)

## 2020-04-02 NOTE — ED Provider Notes (Cosign Needed)
Patient was placed in room epic ER report however when I went to the room there is of the area.  I was unable to test the patient and per nursing staff patient left without being seen.   Pati Gallo Gard[place], [place] 04/02/20 [phone]

## 2020-04-02 NOTE — ED Triage Notes (Signed)
Pt presents to ED with complaints of SOB, cough for over a week now.

## 2020-04-07 ENCOUNTER — Other Ambulatory Visit (HOSPITAL_COMMUNITY): Payer: Self-pay | Admitting: Internal Medicine

## 2020-04-07 DIAGNOSIS — Z1231 Encounter for screening mammogram for malignant neoplasm of breast: Secondary | ICD-10-CM

## 2020-04-16 ENCOUNTER — Ambulatory Visit (HOSPITAL_COMMUNITY): Payer: Medicaid Other

## 2020-05-01 ENCOUNTER — Emergency Department (HOSPITAL_COMMUNITY)
Admission: EM | Admit: 2020-05-01 | Discharge: 2020-05-01 | Disposition: A | Discharge status: Home / Self Care | Payer: Medicaid Other | Attending: Emergency Medicine | Admitting: Emergency Medicine

## 2020-05-01 ENCOUNTER — Other Ambulatory Visit: Payer: Self-pay

## 2020-05-01 ENCOUNTER — Emergency Department (HOSPITAL_COMMUNITY): Payer: Medicaid Other

## 2020-05-01 ENCOUNTER — Encounter (HOSPITAL_COMMUNITY): Payer: Self-pay | Admitting: *Deleted

## 2020-05-01 DIAGNOSIS — Z955 Presence of coronary angioplasty implant and graft: Secondary | ICD-10-CM | POA: Diagnosis not present

## 2020-05-01 DIAGNOSIS — Z7982 Long term (current) use of aspirin: Secondary | ICD-10-CM | POA: Diagnosis not present

## 2020-05-01 DIAGNOSIS — E1165 Type 2 diabetes mellitus with hyperglycemia: Secondary | ICD-10-CM | POA: Diagnosis not present

## 2020-05-01 DIAGNOSIS — I5043 Acute on chronic combined systolic (congestive) and diastolic (congestive) heart failure: Secondary | ICD-10-CM | POA: Insufficient documentation

## 2020-05-01 DIAGNOSIS — R079 Chest pain, unspecified: Secondary | ICD-10-CM

## 2020-05-01 DIAGNOSIS — R0789 Other chest pain: Secondary | ICD-10-CM | POA: Diagnosis not present

## 2020-05-01 DIAGNOSIS — Z794 Long term (current) use of insulin: Secondary | ICD-10-CM | POA: Insufficient documentation

## 2020-05-01 DIAGNOSIS — I110 Hypertensive heart disease with heart failure: Secondary | ICD-10-CM | POA: Insufficient documentation

## 2020-05-01 DIAGNOSIS — E1140 Type 2 diabetes mellitus with diabetic neuropathy, unspecified: Secondary | ICD-10-CM | POA: Diagnosis not present

## 2020-05-01 DIAGNOSIS — R739 Hyperglycemia, unspecified: Secondary | ICD-10-CM

## 2020-05-01 DIAGNOSIS — Z79899 Other long term (current) drug therapy: Secondary | ICD-10-CM | POA: Diagnosis not present

## 2020-05-01 DIAGNOSIS — R7989 Other specified abnormal findings of blood chemistry: Secondary | ICD-10-CM | POA: Insufficient documentation

## 2020-05-01 LAB — CBC
HCT: 35.7 % — ABNORMAL LOW (ref 36.0–46.0)
Hemoglobin: 11.1 g/dL — ABNORMAL LOW (ref 12.0–15.0)
MCH: 23.5 pg — ABNORMAL LOW (ref 26.0–34.0)
MCHC: 31.1 g/dL (ref 30.0–36.0)
MCV: 75.5 fL — ABNORMAL LOW (ref 80.0–100.0)
Platelets: 250 10*3/uL (ref 150–400)
RBC: 4.73 MIL/uL (ref 3.87–5.11)
RDW: 14.8 % (ref 11.5–15.5)
WBC: 9.4 10*3/uL (ref 4.0–10.5)
nRBC: 0 % (ref 0.0–0.2)

## 2020-05-01 LAB — BASIC METABOLIC PANEL
Anion gap: 11 (ref 5–15)
BUN: 12 mg/dL (ref 6–20)
CO2: 23 mmol/L (ref 22–32)
Calcium: 8.9 mg/dL (ref 8.9–10.3)
Chloride: 97 mmol/L — ABNORMAL LOW (ref 98–111)
Creatinine, Ser: 0.91 mg/dL (ref 0.44–1.00)
GFR, Estimated: >60 mL/min (ref >60)
Glucose, Bld: 423 mg/dL — ABNORMAL HIGH (ref 70–99)
Potassium: 3.2 mmol/L — ABNORMAL LOW (ref 3.5–5.1)
Sodium: 131 mmol/L — ABNORMAL LOW (ref 135–145)

## 2020-05-01 LAB — BRAIN NATRIURETIC PEPTIDE: B Natriuretic Peptide: 820 pg/mL — ABNORMAL HIGH (ref 0.0–100.0)

## 2020-05-01 LAB — TROPONIN I (HIGH SENSITIVITY)
Troponin I (High Sensitivity): 21 ng/L — ABNORMAL HIGH (ref <18)
Troponin I (High Sensitivity): 20 ng/L — ABNORMAL HIGH (ref <18)

## 2020-05-01 LAB — CBG MONITORING, ED: Glucose-Capillary: 176 mg/dL — ABNORMAL HIGH (ref 70–99)

## 2020-05-01 MED ORDER — POTASSIUM CHLORIDE ER 10 MEQ PO TBCR: 10.0000 meq | EXTENDED_RELEASE_TABLET | Freq: Every day | ORAL | 0 refills | Status: DC

## 2020-05-01 MED ORDER — TORSEMIDE 20 MG PO TABS
80.0000 mg | ORAL_TABLET | Freq: Once | ORAL | Status: DC
  Filled 2020-05-01: qty 4

## 2020-05-01 MED ORDER — INSULIN ASPART 100 UNIT/ML ~~LOC~~ SOLN
15.0000 [IU] | Freq: Once | SUBCUTANEOUS | Status: AC
  Administered 2020-05-01: 14:00:00 15 [IU] via SUBCUTANEOUS
  Filled 2020-05-01: qty 1

## 2020-05-01 MED ORDER — HYDRALAZINE HCL 25 MG PO TABS
100.0000 mg | ORAL_TABLET | Freq: Once | ORAL | Status: AC
  Administered 2020-05-01: 13:00:00 100 mg via ORAL
  Filled 2020-05-01: qty 4

## 2020-05-01 MED ORDER — POTASSIUM CHLORIDE CRYS ER 20 MEQ PO TBCR
40.0000 meq | EXTENDED_RELEASE_TABLET | Freq: Once | ORAL | Status: AC
  Administered 2020-05-01: 14:00:00 40 meq via ORAL
  Filled 2020-05-01: qty 2

## 2020-05-01 MED ORDER — CARVEDILOL 12.5 MG PO TABS
25.0000 mg | ORAL_TABLET | Freq: Two times a day (BID) | ORAL | Status: DC
  Administered 2020-05-01: 13:00:00 25 mg via ORAL
  Filled 2020-05-01: qty 2

## 2020-05-01 MED ORDER — AMLODIPINE BESYLATE 5 MG PO TABS
10.0000 mg | ORAL_TABLET | Freq: Once | ORAL | Status: AC
  Administered 2020-05-01: 13:00:00 10 mg via ORAL
  Filled 2020-05-01: qty 2

## 2020-05-01 MED ORDER — SACUBITRIL-VALSARTAN 97-103 MG PO TABS
1.0000 | ORAL_TABLET | Freq: Two times a day (BID) | ORAL | Status: DC
  Administered 2020-05-01: 16:00:00 1 via ORAL
  Filled 2020-05-01 (×3): qty 1

## 2020-05-01 NOTE — Discharge Instructions (Addendum)
Your work-up today was largely reassuring.  However, you did have a low potassium and an elevated brain natruretic peptide, suggestive of fluid retention.  Please take your diuretic medications as well as your antihypertensive medications and other prescribed meds, as directed.  It is vitally important that you do not miss any doses.  Your blood sugar was also elevated today, likely due to missed insulin dose.  Please continue to check your blood sugars regularly.  I will need you to follow-up with your primary care provider as soon as possible for ongoing evaluation and management.  You may need to have your diuretic medications adjusted, especially considering your new low potassium.  In the interim, please take the prescribed supplemental potassium.  Return to the ED or seek immediate medical attention should you experience any new or worsening symptoms.

## 2020-05-01 NOTE — ED Notes (Signed)
Pt cursed at nurse and rude related to having to wait in the hall. She states no one has checked on her, however multiple staff members have assisted and rounded on patient during he stay. She is demanding to this nurse that I give her her meds so she can leave. I explain that I am waiting for pharmacy to bring me her meds and then we will prepare her discharge paper. She continues to be rude, yelling out at this nurse while I am at my desk charting. She requests that her IV be removed to she can leave, IV removed despite having no discharge papers in hand. Pt walked out on her own, states her ride is here. She is murmuring and cursing under her breath.

## 2020-05-01 NOTE — ED Notes (Signed)
Pt stating to this nurse "somebody told me forever ago I was leaving. I am fixing to walk up out of here." This nurse informed the patient that the provider is working diligently on her D/C paperwork.

## 2020-05-01 NOTE — ED Provider Notes (Addendum)
[facility] Provider Note   CSN: [id number] Arrival date & time: 05/01/20  4818     History Chief Complaint  Patient presents with  . Chest Pain    Chelsey Santiago is a 44 y.o. female with PMH of HTN, IDDM, pulmonary edema, and chronic combined systolic and diastolic CHF who presents the ED with a 2-day history of chest pain.  Patient states that she did not take her medications this morning.  I reviewed patient's medical record and she was recently admitted to the hospital 03/27/2020 for acute respiratory failure with hypoxia in the context of decompensated CHF.  She also presented with hypertensive emergency with systolic blood pressure over 200.  She was started on nitro drip briefly and her blood pressures improved with home regimen including Coreg, hydralazine, amlodipine, and Entresto.  She was counseled on the importance of medication compliance.  On my examination, patient states that she has had central chest "pressure" that has been constant for 2 days.  She states that she has been particularly stressed and anxious the past few days and suspects that it is the contributing factor.  She had been taking her medications recently, but did not this morning prior to coming to the ED for evaluation.  She states that she has not experienced any significant weight gain, but has noted that her legs have had mild increased swelling.  She does not feel short of breath at rest, but does endorse mild dyspnea with exertion.  She has a primary care provider and has expressed her anxiety and stress to them in the past.  She does not currently have a therapist, but states that she would like one.  She does endorse orthopnea, but states that it is chronic.  Mild cough.  She also states that she has generalized body aches.  She denies any fevers or chills, difficulty breathing, hemoptysis, abdominal pain, nausea or vomiting, dizziness, blurred vision, numbness or weakness, or other  symptoms.  HPI     Past Medical History:  Diagnosis Date  . Anemia    H&H of 10.6/33 and 07/2008 and 11.9/35 and 09/2010  . Anxiety   . Chronic combined systolic and diastolic CHF (congestive heart failure) ([place])   . Depression with anxiety   . Diabetes mellitus without complication ([place])   . Hypertension   . Hypertensive heart disease 2009   Pulmonary edema postpartum; mild to moderate mitral regurgitation when hospitalized for CHF in 2009; Echocardiogram in 12/2009-no MR and normal EF; normal CXR in 09/2010  . Migraine headache   . Miscarriage 03/19/2013  . Obesity 04/16/2009  . Osteoarthritis, knee 03/29/2011  . Preeclampsia   . Pulmonary edema   . Sleep apnea   . Threatened abortion in early pregnancy 03/15/2013    Patient Active Problem List   Diagnosis Date Noted  . Acute respiratory failure with hypoxia ([place]) 03/29/2020  . Obstructive sleep apnea 03/29/2020  . Flash pulmonary edema ([place]) 03/27/2020  . Back pain 12/19/2019  . Vaginal discharge 12/19/2019  . Vaginal itching 12/19/2019  . BV (bacterial vaginosis) 12/19/2019  . Chronic hypertension 12/19/2019  . Fibroids 09/26/2019  . S/P hysterectomy 09/26/2019  . Acute blood loss anemia 09/26/2019  . Abdominal pain, epigastric 03/14/2019  . Dysphagia 03/14/2019  . Gastroesophageal reflux disease 03/14/2019  . Class 2 obesity   . Acute exacerbation of CHF (congestive heart failure) ([place]) 10/26/2018  . Chronic combined systolic and diastolic CHF (congestive heart failure) ([place]) 06/02/2018  . Uncontrolled type 2  diabetes mellitus with hyperglycemia ([place]) 06/02/2018  . Influenza B 05/13/2018  . Hypomagnesemia 05/12/2018  . Headache 05/12/2018  . Upper respiratory tract infection   . HCAP (healthcare-associated pneumonia)   . Constipation 10/17/2017  . Bad headache   . CHF exacerbation ([place]) 09/29/2017  . Iron deficiency anemia 05/16/2017  . Vitamin D deficiency 11/25/2016  . Iron deficiency 11/25/2016  . History  of acute myocardial infarction 10/05/2016  . CHF (congestive heart failure) ([place]) 05/06/2016  . Diabetes mellitus with complication ([place]) 24/23/5361  . Leukocytosis 05/06/2016  . Neuropathy 05/06/2016  . Depression 04/16/2016  . Chronic tension-type headache, not intractable 04/16/2016  . AKI (acute kidney injury) ([place])   . Hyperkalemia   . Nonischemic cardiomyopathy ([place])   . Acute on chronic combined systolic and diastolic ACC/AHA stage C congestive heart failure ([place]) 04/03/2016  . Acute on chronic combined systolic and diastolic CHF, NYHA class 4 ([place]) 04/03/2016  . Hypertensive emergency 04/03/2016  . Cardiomyopathy due to hypertension ([place]) 12/22/2015  . Normal coronary arteries 12/22/2015  . Troponin level elevated 12/22/2015  . NSTEMI (non-ST elevated myocardial infarction) ([place]) 12/20/2015  . Dental infection 10/10/2015  . Chest pain 09/05/2015  . Systolic CHF, chronic ([place]) 09/05/2015  . LLQ pain   . Type 2 diabetes mellitus without complication ([place]) 44/31/5400  . Essential hypertension   . 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 Hypertensive urgency 04/22/2014  . Acute CHF ([place]) 04/22/2014  . S/P cesarean section 04/11/2014  . Acute pulmonary edema ([place]) 04/11/2014  . Postoperative anemia 04/11/2014  . Elevated serum creatinine 04/11/2014  . Preeclampsia, severe 04/09/2014  . Pre-eclampsia superimposed on chronic hypertension, antepartum 04/08/2014  . Dyspnea 04/08/2014  . Polyhydramnios in third trimester, antepartum 03/14/2014  . Abnormal maternal glucose tolerance, antepartum 03/11/2014  . High-risk pregnancy 03/11/2014  . Pre-existing essential hypertension complicating pregnancy 86/76/1950  . Impaired glucose tolerance during pregnancy, antepartum 11/27/2013  . Leiomyoma of uterus 11/22/2013  . History of gestational diabetes in prior pregnancy, currently pregnant in first trimester 11/22/2013  . Hx of preeclampsia, prior pregnancy, currently pregnant 11/22/2013   . Short interval between pregnancies affecting pregnancy, antepartum 11/22/2013  . Supervision of high-risk pregnancy of elderly primigravida (>= 29 years old at delivery), third trimester 11/22/2013  . Miscarriage 03/19/2013  . Major depressive disorder, single episode, unspecified 09/27/2011  . Hypertension   . Hypertensive cardiovascular disease   . Microcytic anemia   . Osteoarthrosis involving lower leg 03/29/2011  . Hypokalemia 12/12/2009  . OSA on CPAP 12/09/2009  . Morbid obesity ([place]) 04/16/2009    Past Surgical History:  Procedure Laterality Date  . BREAST REDUCTION SURGERY  2002  . CARDIAC CATHETERIZATION N/A 12/22/2015   Procedure: Left Heart Cath and Coronary Angiography;  Surgeon: Peter M Martinique, MD;  Location: [facility];  Service: Cardiovascular;  Laterality: N/A;  . CESAREAN SECTION N/A 04/09/2014   Procedure: CESAREAN SECTION;  Surgeon: Mora Bellman, MD;  Location: [facility];  Service: Obstetrics;  Laterality: N/A;  . CHOLECYSTECTOMY    . HYSTERECTOMY ABDOMINAL WITH SALPINGECTOMY N/A 09/26/2019   Procedure: HYSTERECTOMY ABDOMINAL WITH SALPINGECTOMY;  Surgeon: Florian Buff, MD;  Location: [facility];  Service: Gynecology;  Laterality: N/A;  . LIPOMA EXCISION Right 09/26/2019   Procedure: EXCISION LIPOMA RIGHT VULVAR;  Surgeon: Florian Buff, MD;  Location: [facility];  Service: Gynecology;  Laterality: Right;     OB History    Gravida  11   Para  6   Term  5  Preterm  1   AB  5   Living  6     SAB  3   IAB  2   Ectopic      Multiple  0   Live Births  6           Family History  Problem Relation Age of Onset  . Diabetes Mother 60  . Heart disease Mother   . Hyperlipidemia Paternal Grandfather   . Hypertension Paternal Grandfather   . Heart disease Father   . Hypertension Father   . Heart disease Maternal Grandmother 60  . ADD / ADHD Son   . Hypertension Maternal Uncle   . Heart attack Brother   . Sudden death Neg Hx   . Colon  cancer Neg Hx   . Celiac disease Neg Hx   . Inflammatory bowel disease Neg Hx     Social History   Tobacco Use  . Smoking status: Never Smoker  . Smokeless tobacco: Never Used  Vaping Use  . Vaping Use: Never used  Substance Use Topics  . Alcohol use: Yes    Comment: occ  . Drug use: No    Home Medications Prior to Admission medications   Medication Sig Start Date End Date Taking? Authorizing Provider  albuterol (VENTOLIN HFA) 108 (90 Base) MCG/ACT inhaler Inhale 1 puff into the lungs every 6 (six) hours as needed for wheezing or shortness of breath. 03/30/20   Kathie Dike, MD  amLODipine (NORVASC) 10 MG tablet Take 1 tablet (10 mg total) by mouth daily. 09/04/19 03/28/20  Strader, Fransisco Hertz, PA-C  amoxicillin-clavulanate (AUGMENTIN) 875-125 MG tablet Take 1 tablet by mouth every 12 (twelve) hours. 03/30/20   Kathie Dike, MD  ARIPiprazole (ABILIFY) 10 MG tablet Take 1 tablet (10 mg total) by mouth daily. 10/30/18   Barton Dubois, MD  aspirin EC 81 MG tablet Take 81 mg by mouth daily.    [provider]  budesonide-formoterol (SYMBICORT) 80-4.5 MCG/ACT inhaler Inhale 2 puffs into the lungs 2 (two) times daily. 03/30/20   Kathie Dike, MD  carvedilol (COREG) 25 MG tablet Take 2 tablets (50 mg total) by mouth 2 (two) times daily with a meal. 01/23/20   Branch, Alphonse Guild, MD  clonazePAM (KLONOPIN) 0.5 MG tablet Take 0.5 mg by mouth 2 (two) times daily. 12/19/18   [provider]  dextromethorphan-guaiFENesin (MUCINEX DM) 30-600 MG 12hr tablet Take 1 tablet by mouth 2 (two) times daily. 03/30/20   Kathie Dike, MD  gabapentin (NEURONTIN) 600 MG tablet Take 600 mg by mouth 3 (three) times daily.     [provider]  hydrALAZINE (APRESOLINE) 100 MG tablet Take 1 tablet (100 mg total) by mouth 3 (three) times daily. 01/23/20   Arnoldo Lenis, MD  insulin glargine (LANTUS) 100 UNIT/ML injection Inject 30 Units at bedtime into the skin.     [provider]  insulin lispro (HUMALOG) 100 UNIT/ML injection Inject 15 Units into the skin 3 (three) times daily before meals.     [provider]  Ipratropium-Albuterol (COMBIVENT RESPIMAT) 20-100 MCG/ACT AERS respimat Inhale 1 puff into the lungs every 6 (six) hours as needed for wheezing or shortness of breath. 05/04/18   Manuella Ghazi, Pratik D, DO  linaclotide Rolan Lipa) 145 MCG CAPS capsule Take 1 capsule (145 mcg total) by mouth daily before breakfast. 03/14/19   Erenest Rasher, PA-C  nitroGLYCERIN (NITROSTAT) 0.4 MG SL tablet Place 1 tablet (0.4 mg total) under the tongue every 5 (  five) minutes as needed for chest pain. 09/05/15   Kathie Dike, MD  ondansetron (ZOFRAN ODT) 8 MG disintegrating tablet Take 1 tablet (8 mg total) by mouth every 8 (eight) hours as needed for nausea or vomiting. 09/06/19   Long, Wonda Olds, MD  potassium chloride (KLOR-CON) 10 MEQ tablet Take 1 tablet (10 mEq total) by mouth daily for 14 days. 05/01/20 05/15/20  Corena Herter, PA-C  potassium chloride SA (KLOR-CON M20) 20 MEQ tablet Take 2 tablets (40 mEq total) by mouth daily. 09/04/19 03/28/20  Strader, Fransisco Hertz, PA-C  potassium chloride SA (KLOR-CON) 20 MEQ tablet Take 1 tablet (20 mEq total) by mouth 2 (two) times daily. Patient not taking: Reported on 03/28/2020 03/12/20   Fredia Sorrow, MD  predniSONE (DELTASONE) 10 MG tablet Take 40mg  po daily for 2 days then 30mg  daily for 2 days then 20mg  daily for 2 days then 10mg  daily for 2 days then stop 03/30/20   Kathie Dike, MD  promethazine-codeine (PHENERGAN WITH CODEINE) 6.25-10 MG/5ML syrup Take 5 mLs by mouth every 4 (four) hours as needed for cough. 03/30/20   Kathie Dike, MD  pyridostigmine (MESTINON) 60 MG tablet Take 60 mg by mouth every 8 (eight) hours.    [provider]  sacubitril-valsartan (ENTRESTO) 97-103 MG Take 1 tablet by mouth 2 (two) times daily. 03/16/19   Arnoldo Lenis, MD  torsemide (DEMADEX) 20 MG tablet Take 4  tablets (80 mg total) by mouth daily. 03/30/20 06/28/20  Kathie Dike, MD  traZODone (DESYREL) 100 MG tablet Take 2 tablets (200 mg total) by mouth at bedtime. 10/29/18   Barton Dubois, MD  triamcinolone ointment (KENALOG) 0.5 % Apply 1 application topically 2 (two) times daily. 12/19/19   Estill Dooms, NP    Allergies    Diclofenac, Tramadol, and Vicodin [hydrocodone-acetaminophen]  Review of Systems   Review of Systems  All other systems reviewed and are negative.   Physical Exam Updated Vital Signs BP (!) 175/107 (BP Location: Right Arm)   Pulse 95   Temp 97.9 F (36.6 C) (Oral)   Resp 17   LMP 08/21/2019   SpO2 100%   Physical Exam Vitals and nursing note reviewed. Exam conducted with a chaperone present.  Constitutional:      General: She is not in acute distress. HENT:     Head: Normocephalic and atraumatic.  Eyes:     General: No scleral icterus.    Conjunctiva/sclera: Conjunctivae normal.  Cardiovascular:     Rate and Rhythm: Normal rate and regular rhythm.     Pulses: Normal pulses.     Heart sounds: Normal heart sounds.  Pulmonary:     Effort: Pulmonary effort is normal. No respiratory distress.     Breath sounds: Normal breath sounds. No stridor. No wheezing, rhonchi or rales.  Abdominal:     General: Abdomen is flat. There is no distension.     Palpations: Abdomen is soft.     Tenderness: There is no abdominal tenderness. There is no guarding.  Musculoskeletal:     Right lower leg: Edema present.     Left lower leg: Edema present.     Comments: 1+ symmetric pitting edema bilaterally.  Skin:    General: Skin is dry.     Capillary Refill: Capillary refill takes less than 2 seconds.  Neurological:     Mental Status: She is alert and oriented to person, place, and time.     GCS: GCS eye subscore is 4. GCS  verbal subscore is 5. GCS motor subscore is 6.  Psychiatric:        Mood and Affect: Mood normal.        Behavior: Behavior normal.         Thought Content: Thought content normal.     ED Results / Procedures / Treatments   Labs (all labs ordered are listed, but only abnormal results are displayed) Labs Reviewed  BASIC METABOLIC PANEL - Abnormal; Notable for the following components:      Result Value   Sodium 131 (*)    Potassium 3.2 (*)    Chloride 97 (*)    Glucose, Bld 423 (*)    All other components within normal limits  CBC - Abnormal; Notable for the following components:   Hemoglobin 11.1 (*)    HCT 35.7 (*)    MCV 75.5 (*)    MCH 23.5 (*)    All other components within normal limits  BRAIN NATRIURETIC PEPTIDE - Abnormal; Notable for the following components:   B Natriuretic Peptide 820.0 (*)    All other components within normal limits  CBG MONITORING, ED - Abnormal; Notable for the following components:   Glucose-Capillary 176 (*)    All other components within normal limits  TROPONIN I (HIGH SENSITIVITY) - Abnormal; Notable for the following components:   Troponin I (High Sensitivity) 21 (*)    All other components within normal limits  TROPONIN I (HIGH SENSITIVITY)    EKG EKG Interpretation  Date/Time:  Thursday May 01 2020 10:57:58 EST Ventricular Rate:  97 PR Interval:  166 QRS Duration: 100 QT Interval:  396 QTC Calculation: 502 R Axis:   18 Text Interpretation: Normal sinus rhythm Right atrial enlargement Nonspecific ST and T wave abnormality Prolonged QT Abnormal ECG Artifact Confirmed by Fredia Sorrow [phone]) on 05/01/2020 2:49:04 PM   Radiology DG Chest 2 View  Result Date: 05/01/2020 CLINICAL DATA:  Chest pain EXAM: CHEST - 2 VIEW COMPARISON:  April 02, 2020 FINDINGS: Lungs are clear. There is stable cardiac enlargement with pulmonary vascularity within normal limits. No adenopathy. No bone lesions. IMPRESSION: Stable cardiac enlargement.  Lungs clear. Electronically Signed   By: Lowella Grip III M.D.   On: 05/01/2020 12:55    Procedures Procedures (including  critical care time)  Medications Ordered in ED Medications  carvedilol (COREG) tablet 25 mg (25 mg Oral Given 05/01/20 1303)  sacubitril-valsartan (ENTRESTO) 97-103 mg per tablet (1 tablet Oral Given 05/01/20 1534)  torsemide (DEMADEX) tablet 80 mg (80 mg Oral Refused 05/01/20 1549)  hydrALAZINE (APRESOLINE) tablet 100 mg (100 mg Oral Given 05/01/20 1304)  amLODipine (NORVASC) tablet 10 mg (10 mg Oral Given 05/01/20 1302)  potassium chloride SA (KLOR-CON) CR tablet 40 mEq (40 mEq Oral Given 05/01/20 1349)  insulin aspart (novoLOG) injection 15 Units (15 Units Subcutaneous Given 05/01/20 1349)    ED Course  I have reviewed the triage vital signs and the nursing notes.  Pertinent labs & imaging results that were available during my care of the patient were reviewed by me and considered in my medical decision making (see chart for details).    MDM Rules/Calculators/A&P                          Patient is presented to the ED for evaluation of a 2-day history of central chest "pressure".  She suspects that it is related to stress anxiety, but wanted to err on the side of  caution and come to the ED for work-up.  Labs Troponin: Mild elevation to 21, consistent with patient's baseline. CBC: Mild microcytic anemia with hemoglobin of 11.1, also consistent with baseline. BMP: Mild hyponatremia to 131, consistent with baseline.  Patient has a reduced potassium however to 3.2, new when compared to recent labs.  Her glucose is elevated at 423, will provide 40 mEq K-Dur prior to insulin administration.  No elevated anion gap or reduced bicarbonate.  DG chest is personally reviewed and demonstrates no evidence of edema, pneumonia, or other acute cardiopulmonary disease.  Patient was provided her antihypertensives here in the ED given that she had not taken any today.  No evidence of endorgan damage.  We will also administer 15 units regular insulin given her hyperglycemia to 423 since she did not take  any today.  This will be done after providing her with 40 mEq potassium K-Dur given her mildly low potassium.  We will discharge her home with 10 mEq K-Dur and encouraged her to follow-up with her primary care provider for laboratory recheck.  Patient is PERC negative and denying any difficulty breathing, only citing mild dyspnea on exertion that is acute on chronic.    Patient's BNP was elevated to 820.  However, review chest x-ray and there is no evidence of pulmonary edema.  On reevaluation, patient states that she does not always take her medications exactly as directed.  She states that she is ready to go home.  Offered diuresis and continued observation, however she preferred to go home.  She is not tachypneic and she is breathing comfortably here in the ED.  No evidence of increased work of breathing.  Encouraged her to take her at home diuretic medications, as directed.  Do not feel as though her elevated BNP warrants admission for IV diuresis.  She is good outpatient follow-up.  Encouraged to take all of her medications, as directed.  Discussed case with Dr. Rogene Houston who agrees with assessment and plan.  Repeat CBG 176.  Encouraged her to stay put given that repeat troponin was still in-process, however patient is adamant about going home.  Will call her if repeat troponin spikes. Low suspicion given that her chest pain has been constant x 2 days.   All of the evaluation and work-up results were discussed with the patient and any family at bedside.  Patient and/or family were informed that while patient is appropriate for discharge at this time, some medical emergencies may only develop or become detectable after a period of time.  I specifically instructed patient and/or family to return to return to the ED or seek immediate medical attention for any new or worsening symptoms.  They were provided opportunity to ask any additional questions and have none at this time.  Prior to discharge patient is  feeling well, agreeable with plan for discharge home.  They have expressed understanding of verbal discharge instructions as well as return precautions and are agreeable to the plan.    Final Clinical Impression(s) / ED Diagnoses Final diagnoses:  Nonspecific chest pain  Elevated brain natriuretic peptide (BNP) level  Hyperglycemia    Rx / DC Orders ED Discharge Orders         Ordered    potassium chloride (KLOR-CON) 10 MEQ tablet  Daily        05/01/20 1534           Corena Herter, PA-C 05/01/20 1534    Corena Herter, PA-C 05/01/20 1540  Corena Herter, PA-C 05/01/20 1617    Fredia Sorrow, MD 05/02/20 [phone]

## 2020-05-01 NOTE — ED Triage Notes (Signed)
States she did not take her medication this am

## 2020-05-01 NOTE — ED Triage Notes (Signed)
Chest pain for 2 days °

## 2020-06-02 ENCOUNTER — Institutional Professional Consult (permissible substitution): Payer: Medicaid Other | Admitting: Pulmonary Disease

## 2020-06-09 ENCOUNTER — Emergency Department (HOSPITAL_COMMUNITY): Payer: Medicaid Other

## 2020-06-09 ENCOUNTER — Inpatient Hospital Stay (HOSPITAL_COMMUNITY)
Admission: EM | Admit: 2020-06-09 | Discharge: 2020-06-11 | DRG: 177 | Disposition: A | Discharge status: Home / Self Care | Payer: Medicaid Other | Attending: Internal Medicine | Admitting: Internal Medicine

## 2020-06-09 ENCOUNTER — Inpatient Hospital Stay (HOSPITAL_COMMUNITY): Payer: Medicaid Other

## 2020-06-09 ENCOUNTER — Encounter (HOSPITAL_COMMUNITY): Payer: Self-pay | Admitting: *Deleted

## 2020-06-09 ENCOUNTER — Other Ambulatory Visit: Payer: Self-pay

## 2020-06-09 DIAGNOSIS — Z6834 Body mass index (BMI) 34.0-34.9, adult: Secondary | ICD-10-CM

## 2020-06-09 DIAGNOSIS — Z90710 Acquired absence of both cervix and uterus: Secondary | ICD-10-CM

## 2020-06-09 DIAGNOSIS — Z7982 Long term (current) use of aspirin: Secondary | ICD-10-CM | POA: Diagnosis not present

## 2020-06-09 DIAGNOSIS — Z9114 Patient's other noncompliance with medication regimen: Secondary | ICD-10-CM | POA: Diagnosis not present

## 2020-06-09 DIAGNOSIS — J9621 Acute and chronic respiratory failure with hypoxia: Secondary | ICD-10-CM | POA: Diagnosis present

## 2020-06-09 DIAGNOSIS — I509 Heart failure, unspecified: Secondary | ICD-10-CM

## 2020-06-09 DIAGNOSIS — F419 Anxiety disorder, unspecified: Secondary | ICD-10-CM | POA: Diagnosis present

## 2020-06-09 DIAGNOSIS — I5043 Acute on chronic combined systolic (congestive) and diastolic (congestive) heart failure: Secondary | ICD-10-CM | POA: Diagnosis present

## 2020-06-09 DIAGNOSIS — Z8701 Personal history of pneumonia (recurrent): Secondary | ICD-10-CM

## 2020-06-09 DIAGNOSIS — I252 Old myocardial infarction: Secondary | ICD-10-CM | POA: Diagnosis not present

## 2020-06-09 DIAGNOSIS — I119 Hypertensive heart disease without heart failure: Secondary | ICD-10-CM | POA: Diagnosis present

## 2020-06-09 DIAGNOSIS — Z9981 Dependence on supplemental oxygen: Secondary | ICD-10-CM | POA: Diagnosis not present

## 2020-06-09 DIAGNOSIS — E876 Hypokalemia: Secondary | ICD-10-CM | POA: Diagnosis present

## 2020-06-09 DIAGNOSIS — Z833 Family history of diabetes mellitus: Secondary | ICD-10-CM

## 2020-06-09 DIAGNOSIS — U071 COVID-19: Secondary | ICD-10-CM | POA: Diagnosis present

## 2020-06-09 DIAGNOSIS — Z8249 Family history of ischemic heart disease and other diseases of the circulatory system: Secondary | ICD-10-CM

## 2020-06-09 DIAGNOSIS — T380X5A Adverse effect of glucocorticoids and synthetic analogues, initial encounter: Secondary | ICD-10-CM | POA: Diagnosis present

## 2020-06-09 DIAGNOSIS — Z79899 Other long term (current) drug therapy: Secondary | ICD-10-CM

## 2020-06-09 DIAGNOSIS — Z794 Long term (current) use of insulin: Secondary | ICD-10-CM | POA: Diagnosis not present

## 2020-06-09 DIAGNOSIS — I110 Hypertensive heart disease with heart failure: Secondary | ICD-10-CM | POA: Diagnosis present

## 2020-06-09 DIAGNOSIS — Z7951 Long term (current) use of inhaled steroids: Secondary | ICD-10-CM

## 2020-06-09 DIAGNOSIS — I428 Other cardiomyopathies: Secondary | ICD-10-CM | POA: Diagnosis present

## 2020-06-09 DIAGNOSIS — G4733 Obstructive sleep apnea (adult) (pediatric): Secondary | ICD-10-CM

## 2020-06-09 DIAGNOSIS — J1282 Pneumonia due to coronavirus disease 2019: Secondary | ICD-10-CM | POA: Diagnosis present

## 2020-06-09 DIAGNOSIS — M549 Dorsalgia, unspecified: Secondary | ICD-10-CM

## 2020-06-09 DIAGNOSIS — E66812 Obesity, class 2: Secondary | ICD-10-CM | POA: Diagnosis present

## 2020-06-09 DIAGNOSIS — F32A Depression, unspecified: Secondary | ICD-10-CM | POA: Diagnosis present

## 2020-06-09 DIAGNOSIS — Z885 Allergy status to narcotic agent status: Secondary | ICD-10-CM

## 2020-06-09 DIAGNOSIS — Z888 Allergy status to other drugs, medicaments and biological substances status: Secondary | ICD-10-CM

## 2020-06-09 DIAGNOSIS — Z9989 Dependence on other enabling machines and devices: Secondary | ICD-10-CM | POA: Diagnosis not present

## 2020-06-09 DIAGNOSIS — M1710 Unilateral primary osteoarthritis, unspecified knee: Secondary | ICD-10-CM | POA: Diagnosis present

## 2020-06-09 DIAGNOSIS — E1165 Type 2 diabetes mellitus with hyperglycemia: Secondary | ICD-10-CM | POA: Diagnosis present

## 2020-06-09 LAB — CBC WITH DIFFERENTIAL/PLATELET
Abs Immature Granulocytes: 0.05 10*3/uL (ref 0.00–0.07)
Basophils Absolute: 0 10*3/uL (ref 0.0–0.1)
Basophils Relative: 1 %
Eosinophils Absolute: 0 10*3/uL (ref 0.0–0.5)
Eosinophils Relative: 0 %
HCT: 35.7 % — ABNORMAL LOW (ref 36.0–46.0)
Hemoglobin: 10.9 g/dL — ABNORMAL LOW (ref 12.0–15.0)
Immature Granulocytes: 1 %
Lymphocytes Relative: 9 %
Lymphs Abs: 0.5 10*3/uL — ABNORMAL LOW (ref 0.7–4.0)
MCH: 22.8 pg — ABNORMAL LOW (ref 26.0–34.0)
MCHC: 30.5 g/dL (ref 30.0–36.0)
MCV: 74.7 fL — ABNORMAL LOW (ref 80.0–100.0)
Monocytes Absolute: 0.5 10*3/uL (ref 0.1–1.0)
Monocytes Relative: 8 %
Neutro Abs: 4.8 10*3/uL (ref 1.7–7.7)
Neutrophils Relative %: 81 %
Platelets: 261 10*3/uL (ref 150–400)
RBC: 4.78 MIL/uL (ref 3.87–5.11)
RDW: 15.5 % (ref 11.5–15.5)
WBC: 5.9 10*3/uL (ref 4.0–10.5)
nRBC: 0 % (ref 0.0–0.2)

## 2020-06-09 LAB — COMPREHENSIVE METABOLIC PANEL
ALT: 18 U/L (ref 0–44)
AST: 22 U/L (ref 15–41)
Albumin: 2.8 g/dL — ABNORMAL LOW (ref 3.5–5.0)
Alkaline Phosphatase: 75 U/L (ref 38–126)
Anion gap: 12 (ref 5–15)
BUN: 17 mg/dL (ref 6–20)
CO2: 29 mmol/L (ref 22–32)
Calcium: 8 mg/dL — ABNORMAL LOW (ref 8.9–10.3)
Chloride: 91 mmol/L — ABNORMAL LOW (ref 98–111)
Creatinine, Ser: 0.9 mg/dL (ref 0.44–1.00)
GFR, Estimated: >60 mL/min (ref >60)
Glucose, Bld: 199 mg/dL — ABNORMAL HIGH (ref 70–99)
Potassium: 3 mmol/L — ABNORMAL LOW (ref 3.5–5.1)
Sodium: 132 mmol/L — ABNORMAL LOW (ref 135–145)
Total Bilirubin: 0.7 mg/dL (ref 0.3–1.2)
Total Protein: 6.7 g/dL (ref 6.5–8.1)

## 2020-06-09 LAB — TROPONIN I (HIGH SENSITIVITY): Troponin I (High Sensitivity): 43 ng/L — ABNORMAL HIGH (ref <18)

## 2020-06-09 LAB — D-DIMER, QUANTITATIVE: D-Dimer, Quant: 1.5 ug/mL-FEU — ABNORMAL HIGH (ref 0.00–0.50)

## 2020-06-09 LAB — BASIC METABOLIC PANEL
Anion gap: 13 (ref 5–15)
BUN: 18 mg/dL (ref 6–20)
CO2: 27 mmol/L (ref 22–32)
Calcium: 8.7 mg/dL — ABNORMAL LOW (ref 8.9–10.3)
Chloride: 93 mmol/L — ABNORMAL LOW (ref 98–111)
Creatinine, Ser: 1.06 mg/dL — ABNORMAL HIGH (ref 0.44–1.00)
GFR, Estimated: >60 mL/min (ref >60)
Glucose, Bld: 245 mg/dL — ABNORMAL HIGH (ref 70–99)
Potassium: 2.9 mmol/L — ABNORMAL LOW (ref 3.5–5.1)
Sodium: 133 mmol/L — ABNORMAL LOW (ref 135–145)

## 2020-06-09 LAB — C-REACTIVE PROTEIN: CRP: 2.1 mg/dL — ABNORMAL HIGH (ref <1.0)

## 2020-06-09 LAB — BRAIN NATRIURETIC PEPTIDE: B Natriuretic Peptide: 1187 pg/mL — ABNORMAL HIGH (ref 0.0–100.0)

## 2020-06-09 LAB — LACTATE DEHYDROGENASE: LDH: 149 U/L (ref 98–192)

## 2020-06-09 LAB — PROCALCITONIN: Procalcitonin: <0.1 ng/mL

## 2020-06-09 LAB — POC SARS CORONAVIRUS 2 AG -  ED: SARS Coronavirus 2 Ag: POSITIVE — AB

## 2020-06-09 LAB — MAGNESIUM: Magnesium: 1.3 mg/dL — ABNORMAL LOW (ref 1.7–2.4)

## 2020-06-09 LAB — FERRITIN: Ferritin: 48 ng/mL (ref 11–307)

## 2020-06-09 LAB — FIBRINOGEN: Fibrinogen: 456 mg/dL (ref 210–475)

## 2020-06-09 MED ORDER — IOHEXOL 350 MG/ML SOLN
100.0000 mL | Freq: Once | INTRAVENOUS | Status: AC | PRN
  Administered 2020-06-09: 100 mL via INTRAVENOUS

## 2020-06-09 MED ORDER — FUROSEMIDE 10 MG/ML IJ SOLN
20.0000 mg | Freq: Once | INTRAMUSCULAR | Status: AC
  Administered 2020-06-09: 20 mg via INTRAVENOUS
  Filled 2020-06-09: qty 2

## 2020-06-09 MED ORDER — GUAIFENESIN-DM 100-10 MG/5ML PO SYRP: 10.0000 mL | ORAL_SOLUTION | ORAL | Status: DC | PRN

## 2020-06-09 MED ORDER — INSULIN GLARGINE 100 UNIT/ML ~~LOC~~ SOLN
30.0000 [IU] | Freq: Every day | SUBCUTANEOUS | Status: DC
  Administered 2020-06-10: 30 [IU] via SUBCUTANEOUS
  Filled 2020-06-09 (×2): qty 0.3

## 2020-06-09 MED ORDER — ENOXAPARIN SODIUM 40 MG/0.4ML ~~LOC~~ SOLN
40.0000 mg | SUBCUTANEOUS | Status: DC
  Administered 2020-06-09: 40 mg via SUBCUTANEOUS
  Filled 2020-06-09: qty 0.4

## 2020-06-09 MED ORDER — POLYETHYLENE GLYCOL 3350 17 G PO PACK
17.0000 g | PACK | Freq: Every day | ORAL | Status: DC | PRN
  Administered 2020-06-11: 17 g via ORAL
  Filled 2020-06-09: qty 1

## 2020-06-09 MED ORDER — SODIUM CHLORIDE 0.9 % IV SOLN
100.0000 mg | INTRAVENOUS | Status: AC
  Administered 2020-06-10 (×2): 100 mg via INTRAVENOUS

## 2020-06-09 MED ORDER — FUROSEMIDE 10 MG/ML IJ SOLN
40.0000 mg | Freq: Once | INTRAMUSCULAR | Status: AC
  Administered 2020-06-09: 40 mg via INTRAVENOUS
  Filled 2020-06-09: qty 4

## 2020-06-09 MED ORDER — DEXAMETHASONE SODIUM PHOSPHATE 10 MG/ML IJ SOLN: 6.0000 mg | INTRAMUSCULAR | Status: DC

## 2020-06-09 MED ORDER — DEXAMETHASONE SODIUM PHOSPHATE 10 MG/ML IJ SOLN
10.0000 mg | Freq: Once | INTRAMUSCULAR | Status: AC
  Administered 2020-06-09: 10 mg via INTRAVENOUS
  Filled 2020-06-09: qty 1

## 2020-06-09 MED ORDER — ONDANSETRON HCL 4 MG PO TABS: 4.0000 mg | ORAL_TABLET | Freq: Four times a day (QID) | ORAL | Status: DC | PRN

## 2020-06-09 MED ORDER — POTASSIUM CHLORIDE CRYS ER 20 MEQ PO TBCR: 10.0000 meq | EXTENDED_RELEASE_TABLET | Freq: Every day | ORAL | Status: DC

## 2020-06-09 MED ORDER — FUROSEMIDE 10 MG/ML IJ SOLN
60.0000 mg | Freq: Two times a day (BID) | INTRAMUSCULAR | Status: DC
  Administered 2020-06-10 – 2020-06-11 (×3): 60 mg via INTRAVENOUS
  Filled 2020-06-09 (×3): qty 6

## 2020-06-09 MED ORDER — ACETAMINOPHEN 325 MG PO TABS
650.0000 mg | ORAL_TABLET | Freq: Four times a day (QID) | ORAL | Status: DC | PRN
  Administered 2020-06-09 – 2020-06-10 (×3): 650 mg via ORAL
  Filled 2020-06-09 (×3): qty 2

## 2020-06-09 MED ORDER — SODIUM CHLORIDE 0.9 % IV SOLN
100.0000 mg | Freq: Every day | INTRAVENOUS | Status: DC
  Administered 2020-06-10 – 2020-06-11 (×2): 100 mg via INTRAVENOUS
  Filled 2020-06-09 (×2): qty 20

## 2020-06-09 MED ORDER — ONDANSETRON HCL 4 MG/2ML IJ SOLN
4.0000 mg | Freq: Four times a day (QID) | INTRAMUSCULAR | Status: DC | PRN
  Administered 2020-06-09: 4 mg via INTRAVENOUS
  Filled 2020-06-09: qty 2

## 2020-06-09 MED ORDER — ALBUTEROL SULFATE HFA 108 (90 BASE) MCG/ACT IN AERS: 1.0000 | INHALATION_SPRAY | Freq: Four times a day (QID) | RESPIRATORY_TRACT | Status: DC | PRN

## 2020-06-09 MED ORDER — ZINC SULFATE 220 (50 ZN) MG PO CAPS
220.0000 mg | ORAL_CAPSULE | Freq: Every day | ORAL | Status: DC
  Administered 2020-06-09 – 2020-06-11 (×3): 220 mg via ORAL
  Filled 2020-06-09 (×2): qty 1

## 2020-06-09 MED ORDER — POTASSIUM CHLORIDE CRYS ER 20 MEQ PO TBCR
40.0000 meq | EXTENDED_RELEASE_TABLET | Freq: Once | ORAL | Status: AC
  Administered 2020-06-09: 40 meq via ORAL
  Filled 2020-06-09: qty 2

## 2020-06-09 MED ORDER — ASCORBIC ACID 500 MG PO TABS
500.0000 mg | ORAL_TABLET | Freq: Every day | ORAL | Status: DC
  Administered 2020-06-09 – 2020-06-11 (×3): 500 mg via ORAL
  Filled 2020-06-09 (×3): qty 1

## 2020-06-09 MED ORDER — ACETAMINOPHEN 325 MG PO TABS
650.0000 mg | ORAL_TABLET | Freq: Once | ORAL | Status: AC
  Administered 2020-06-09: 650 mg via ORAL
  Filled 2020-06-09: qty 2

## 2020-06-09 NOTE — ED Notes (Signed)
Pt placed on a Purewick at this time.  

## 2020-06-09 NOTE — Discharge Instructions (Signed)
You have tested POSITIVE for COVID 19 today. It is recommended that you self isolate for 5 days and on day 6 if your symptoms are improving and you are fever free (without fever reducing medications) then you can resume normal daily activity with an additional 5 days of mask wearing whenever around other individuals.   Pick up potassium supplements and take as prescribed as your potassium was decreased today. You will need to have your potassium level rechecked in 1-2 weeks by your PCP.   Take your Torsemide as prescribed and wear your oxygen at all times while you are dealing with COVID 19   Return to the ED for any worsening symptoms including having an increased oxygen demand, passing out, lips/fingers turning blue, worsening shortness of breath, severe chest pain, inability to awaken easily, new onset confusion.

## 2020-06-09 NOTE — ED Provider Notes (Signed)
[facility] Provider Note   CSN: [id number] Arrival date & time: 06/09/20  1315     History Chief Complaint  Patient presents with  . Shortness of Breath  . Covid Positive    Chelsey Santiago is a 45 y.o. female with PMHx HTN, Diabetes, CHF with EF 30-35% (03/28/2020) on 2L home O2 as needed who presents to the ED today with complaint of productive cough x 2 days with associated shortness of breath. Pt reports that she uses her oxygen as needed during the daytime and does not typically sleep with oxygen however last night felt so short of breath she needed to sleep with her 2L oxygen. She assumed she was having a CHF exacerbation prompting her to come to the ED today. She was unaware that she had a fever until she came to the ED and was found to be febrile at 101.1 F. Pt denies any recent sick contacts however is unvaccinated against COVID 70. She denies chills, body aches, chest pain, abdominal pain, nausea, vomiting, diarrhea, or any other associated symptoms.    The history is provided by the patient and medical records.       Past Medical History:  Diagnosis Date  . Anemia    H&H of 10.6/33 and 07/2008 and 11.9/35 and 09/2010  . Anxiety   . Chronic combined systolic and diastolic CHF (congestive heart failure) ([place])   . Depression with anxiety   . Diabetes mellitus without complication ([place])   . Hypertension   . Hypertensive heart disease 2009   Pulmonary edema postpartum; mild to moderate mitral regurgitation when hospitalized for CHF in 2009; Echocardiogram in 12/2009-no MR and normal EF; normal CXR in 09/2010  . Migraine headache   . Miscarriage 03/19/2013  . Obesity 04/16/2009  . Osteoarthritis, knee 03/29/2011  . Preeclampsia   . Pulmonary edema   . Sleep apnea   . Threatened abortion in early pregnancy 03/15/2013    Patient Active Problem List   Diagnosis Date Noted  . Acute respiratory failure with hypoxia ([place]) 03/29/2020  . Obstructive sleep  apnea 03/29/2020  . Flash pulmonary edema ([place]) 03/27/2020  . Back pain 12/19/2019  . Vaginal discharge 12/19/2019  . Vaginal itching 12/19/2019  . BV (bacterial vaginosis) 12/19/2019  . Chronic hypertension 12/19/2019  . Fibroids 09/26/2019  . S/P hysterectomy 09/26/2019  . Acute blood loss anemia 09/26/2019  . Abdominal pain, epigastric 03/14/2019  . Dysphagia 03/14/2019  . Gastroesophageal reflux disease 03/14/2019  . Class 2 obesity   . Acute exacerbation of CHF (congestive heart failure) ([place]) 10/26/2018  . Chronic combined systolic and diastolic CHF (congestive heart failure) ([place]) 06/02/2018  . Uncontrolled type 2 diabetes mellitus with hyperglycemia ([place]) 06/02/2018  . Influenza B 05/13/2018  . Hypomagnesemia 05/12/2018  . Headache 05/12/2018  . Upper respiratory tract infection   . HCAP (healthcare-associated pneumonia)   . Constipation 10/17/2017  . Bad headache   . CHF exacerbation ([place]) 09/29/2017  . Iron deficiency anemia 05/16/2017  . Vitamin D deficiency 11/25/2016  . Iron deficiency 11/25/2016  . History of acute myocardial infarction 10/05/2016  . CHF (congestive heart failure) ([place]) 05/06/2016  . Diabetes mellitus with complication ([place]) 26/83/4196  . Leukocytosis 05/06/2016  . Neuropathy 05/06/2016  . Depression 04/16/2016  . Chronic tension-type headache, not intractable 04/16/2016  . AKI (acute kidney injury) ([place])   . Hyperkalemia   . Nonischemic cardiomyopathy ([place])   . Acute on chronic combined systolic and diastolic ACC/AHA stage C congestive heart  failure ([place]) 04/03/2016  . Acute on chronic combined systolic and diastolic CHF, NYHA class 4 ([place]) 04/03/2016  . Hypertensive emergency 04/03/2016  . Cardiomyopathy due to hypertension ([place]) 12/22/2015  . Normal coronary arteries 12/22/2015  . Troponin level elevated 12/22/2015  . NSTEMI (non-ST elevated myocardial infarction) ([place]) 12/20/2015  . Dental infection 10/10/2015  . Chest pain 09/05/2015   . Systolic CHF, chronic ([place]) 09/05/2015  . LLQ pain   . Type 2 diabetes mellitus without complication ([place]) 61/60/7371  . Essential hypertension   . 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 Hypertensive urgency 04/22/2014  . Acute CHF ([place]) 04/22/2014  . S/P cesarean section 04/11/2014  . Acute pulmonary edema ([place] [facility]) 04/11/2014  . Postoperative anemia 04/11/2014  . Elevated serum creatinine 04/11/2014  . Preeclampsia, severe 04/09/2014  . Pre-eclampsia superimposed on chronic hypertension, antepartum 04/08/2014  . Dyspnea 04/08/2014  . Polyhydramnios in third trimester, antepartum 03/14/2014  . Abnormal maternal glucose tolerance, antepartum 03/11/2014  . High-risk pregnancy 03/11/2014  . Pre-existing essential hypertension complicating pregnancy 11/10/9483  . Impaired glucose tolerance during pregnancy, antepartum 11/27/2013  . Leiomyoma of uterus 11/22/2013  . History of gestational diabetes in prior pregnancy, currently pregnant in first trimester 11/22/2013  . Hx of preeclampsia, prior pregnancy, currently pregnant 11/22/2013  . Short interval between pregnancies affecting pregnancy, antepartum 11/22/2013  . Supervision of high-risk pregnancy of elderly primigravida (>= 33 years old at delivery), third trimester 11/22/2013  . Miscarriage 03/19/2013  . Major depressive disorder, single episode, unspecified 09/27/2011  . Hypertension   . Hypertensive cardiovascular disease   . Microcytic anemia   . Osteoarthrosis involving lower leg 03/29/2011  . Hypokalemia 12/12/2009  . OSA on CPAP 12/09/2009  . Morbid obesity ([place]) 04/16/2009    Past Surgical History:  Procedure Laterality Date  . BREAST REDUCTION SURGERY  2002  . CARDIAC CATHETERIZATION N/A 12/22/2015   Procedure: Left Heart Cath and Coronary Angiography;  Surgeon: Peter M Martinique, MD;  Location: [facility];  Service: Cardiovascular;  Laterality: N/A;  . CESAREAN SECTION N/A 04/09/2014   Procedure: CESAREAN SECTION;   Surgeon: Mora Bellman, MD;  Location: [facility];  Service: Obstetrics;  Laterality: N/A;  . CHOLECYSTECTOMY    . HYSTERECTOMY ABDOMINAL WITH SALPINGECTOMY N/A 09/26/2019   Procedure: HYSTERECTOMY ABDOMINAL WITH SALPINGECTOMY;  Surgeon: Florian Buff, MD;  Location: [facility];  Service: Gynecology;  Laterality: N/A;  . LIPOMA EXCISION Right 09/26/2019   Procedure: EXCISION LIPOMA RIGHT VULVAR;  Surgeon: Florian Buff, MD;  Location: [facility];  Service: Gynecology;  Laterality: Right;     OB History    Gravida  11   Para  6   Term  5   Preterm  1   AB  5   Living  6     SAB  3   IAB  2   Ectopic      Multiple  0   Live Births  6           Family History  Problem Relation Age of Onset  . Diabetes Mother 61  . Heart disease Mother   . Hyperlipidemia Paternal Grandfather   . Hypertension Paternal Grandfather   . Heart disease Father   . Hypertension Father   . Heart disease Maternal Grandmother 60  . ADD / ADHD Son   . Hypertension Maternal Uncle   . Heart attack Brother   . Sudden death Neg Hx   . Colon cancer Neg Hx   . Celiac disease  Neg Hx   . Inflammatory bowel disease Neg Hx     Social History   Tobacco Use  . Smoking status: Never Smoker  . Smokeless tobacco: Never Used  Vaping Use  . Vaping Use: Never used  Substance Use Topics  . Alcohol use: Yes    Comment: occ  . Drug use: No    Home Medications Prior to Admission medications   Medication Sig Start Date End Date Taking? Authorizing Provider  albuterol (VENTOLIN HFA) 108 (90 Base) MCG/ACT inhaler Inhale 1 puff into the lungs every 6 (six) hours as needed for wheezing or shortness of breath. 03/30/20   Kathie Dike, MD  amLODipine (NORVASC) 10 MG tablet Take 1 tablet (10 mg total) by mouth daily. 09/04/19 03/28/20  Strader, Fransisco Hertz, PA-C  amoxicillin-clavulanate (AUGMENTIN) 875-125 MG tablet Take 1 tablet by mouth every 12 (twelve) hours. 03/30/20   Kathie Dike, MD  ARIPiprazole  (ABILIFY) 10 MG tablet Take 1 tablet (10 mg total) by mouth daily. 10/30/18   Barton Dubois, MD  aspirin EC 81 MG tablet Take 81 mg by mouth daily.    [provider]  budesonide-formoterol (SYMBICORT) 80-4.5 MCG/ACT inhaler Inhale 2 puffs into the lungs 2 (two) times daily. 03/30/20   Kathie Dike, MD  carvedilol (COREG) 25 MG tablet Take 2 tablets (50 mg total) by mouth 2 (two) times daily with a meal. 01/23/20   Branch, Alphonse Guild, MD  clonazePAM (KLONOPIN) 0.5 MG tablet Take 0.5 mg by mouth 2 (two) times daily. 12/19/18   [provider]  dextromethorphan-guaiFENesin (MUCINEX DM) 30-600 MG 12hr tablet Take 1 tablet by mouth 2 (two) times daily. 03/30/20   Kathie Dike, MD  gabapentin (NEURONTIN) 600 MG tablet Take 600 mg by mouth 3 (three) times daily.     [provider]  hydrALAZINE (APRESOLINE) 100 MG tablet Take 1 tablet (100 mg total) by mouth 3 (three) times daily. 01/23/20   Arnoldo Lenis, MD  insulin glargine (LANTUS) 100 UNIT/ML injection Inject 30 Units at bedtime into the skin.     [provider]  insulin lispro (HUMALOG) 100 UNIT/ML injection Inject 15 Units into the skin 3 (three) times daily before meals.     [provider]  Ipratropium-Albuterol (COMBIVENT RESPIMAT) 20-100 MCG/ACT AERS respimat Inhale 1 puff into the lungs every 6 (six) hours as needed for wheezing or shortness of breath. 05/04/18   Manuella Ghazi, Pratik D, DO  linaclotide Rolan Lipa) 145 MCG CAPS capsule Take 1 capsule (145 mcg total) by mouth daily before breakfast. 03/14/19   Erenest Rasher, PA-C  nitroGLYCERIN (NITROSTAT) 0.4 MG SL tablet Place 1 tablet (0.4 mg total) under the tongue every 5 (five) minutes as needed for chest pain. 09/05/15   Kathie Dike, MD  ondansetron (ZOFRAN ODT) 8 MG disintegrating tablet Take 1 tablet (8 mg total) by mouth every 8 (eight) hours as needed for nausea or vomiting. 09/06/19   Long, Wonda Olds, MD  potassium chloride (KLOR-CON) 10 MEQ  tablet Take 1 tablet (10 mEq total) by mouth daily for 14 days. 05/01/20 05/15/20  Corena Herter, PA-C  potassium chloride SA (KLOR-CON M20) 20 MEQ tablet Take 2 tablets (40 mEq total) by mouth daily. 09/04/19 03/28/20  Strader, Fransisco Hertz, PA-C  potassium chloride SA (KLOR-CON) 20 MEQ tablet Take 1 tablet (20 mEq total) by mouth 2 (two) times daily. Patient not taking: Reported on 03/28/2020 03/12/20   Fredia Sorrow, MD  predniSONE (DELTASONE) 10 MG tablet Take 40mg  po  daily for 2 days then 30mg  daily for 2 days then 20mg  daily for 2 days then 10mg  daily for 2 days then stop 03/30/20   Kathie Dike, MD  promethazine-codeine (PHENERGAN WITH CODEINE) 6.25-10 MG/5ML syrup Take 5 mLs by mouth every 4 (four) hours as needed for cough. 03/30/20   Kathie Dike, MD  pyridostigmine (MESTINON) 60 MG tablet Take 60 mg by mouth every 8 (eight) hours.    [provider]  sacubitril-valsartan (ENTRESTO) 97-103 MG Take 1 tablet by mouth 2 (two) times daily. 03/16/19   Arnoldo Lenis, MD  torsemide (DEMADEX) 20 MG tablet Take 4 tablets (80 mg total) by mouth daily. 03/30/20 06/28/20  Kathie Dike, MD  traZODone (DESYREL) 100 MG tablet Take 2 tablets (200 mg total) by mouth at bedtime. 10/29/18   Barton Dubois, MD  triamcinolone ointment (KENALOG) 0.5 % Apply 1 application topically 2 (two) times daily. 12/19/19   Estill Dooms, NP    Allergies    Diclofenac, Tramadol, and Vicodin [hydrocodone-acetaminophen]  Review of Systems   Review of Systems  Constitutional: Positive for fatigue and fever. Negative for chills.  Respiratory: Positive for cough and shortness of breath.   Cardiovascular: Negative for chest pain.  All other systems reviewed and are negative.   Physical Exam Updated Vital Signs BP (!) 172/96 (BP Location: Right Arm)   Pulse 93   Temp (!) 101.1 F (38.4 C) (Oral)   Resp (!) 22   Ht 5\' 6"  (1.676 m)   Wt 102.1 kg   LMP 08/21/2019   SpO2 98%   BMI 36.32  kg/m   Physical Exam Vitals and nursing note reviewed.  Constitutional:      Appearance: She is obese. She is not ill-appearing or diaphoretic.  HENT:     Head: Normocephalic and atraumatic.  Eyes:     Conjunctiva/sclera: Conjunctivae normal.  Cardiovascular:     Rate and Rhythm: Normal rate and regular rhythm.  Pulmonary:     Effort: Pulmonary effort is normal.     Breath sounds: Decreased breath sounds present. No wheezing, rhonchi or rales.     Comments: On 2L O2 satting 98%. Able to speak in full sentences without difficulty. Speaking on the phone with multiple family members regarding her COVID status during examination. Lungs with diminished breath sounds at the bases. No wheezes, rales, or rhonchi.  Abdominal:     Palpations: Abdomen is soft.     Tenderness: There is no abdominal tenderness.  Musculoskeletal:     Cervical back: Neck supple.  Skin:    General: Skin is warm and dry.  Neurological:     Mental Status: She is alert.     ED Results / Procedures / Treatments   Labs (all labs ordered are listed, but only abnormal results are displayed) Labs Reviewed  BASIC METABOLIC PANEL - Abnormal; Notable for the following components:      Result Value   Sodium 133 (*)    Potassium 2.9 (*)    Chloride 93 (*)    Glucose, Bld 245 (*)    Creatinine, Ser 1.06 (*)    Calcium 8.7 (*)    All other components within normal limits  CBC WITH DIFFERENTIAL/PLATELET - Abnormal; Notable for the following components:   Hemoglobin 10.9 (*)    HCT 35.7 (*)    MCV 74.7 (*)    MCH 22.8 (*)    Lymphs Abs 0.5 (*)    All other components within normal limits  BRAIN  NATRIURETIC PEPTIDE - Abnormal; Notable for the following components:   B Natriuretic Peptide 1,187.0 (*)    All other components within normal limits  POC SARS CORONAVIRUS 2 AG -  ED - Abnormal; Notable for the following components:   SARS Coronavirus 2 Ag POSITIVE (*)    All other components within normal limits     EKG EKG Interpretation  Date/Time:  Monday June 09 2020 16:00:13 EST Ventricular Rate:  87 PR Interval:  162 QRS Duration: 98 QT Interval:  412 QTC Calculation: 495 R Axis:   70 Text Interpretation: Normal sinus rhythm ST & T wave abnormality, consider inferolateral ischemia Prolonged QT Abnormal ECG No significant change since last tracing No STEMI Confirmed by Nanda Quinton [phone]) on 06/09/2020 4:16:54 PM   Radiology DG Chest Port 1 View  Result Date: 06/09/2020 CLINICAL DATA:  Cough, COVID positive EXAM: PORTABLE CHEST 1 VIEW COMPARISON:  05/01/2020 FINDINGS: Stable cardiomediastinal contours with mild cardiomegaly. Both lungs are clear. No pleural effusion or pneumothorax. The visualized skeletal structures are unremarkable. IMPRESSION: No acute process in the chest.  Stable mild cardiomegaly. Electronically Signed   By: Macy Mis M.D.   On: 06/09/2020 14:31    Procedures Procedures   Medications Ordered in ED Medications  potassium chloride SA (KLOR-CON) CR tablet 10 mEq (has no administration in time range)  acetaminophen (TYLENOL) tablet 650 mg (650 mg Oral Given 06/09/20 1453)  potassium chloride SA (KLOR-CON) CR tablet 40 mEq (40 mEq Oral Given 06/09/20 1730)  furosemide (LASIX) injection 40 mg (40 mg Intravenous Given 06/09/20 1728)    ED Course  I have reviewed the triage vital signs and the nursing notes.  Pertinent labs & imaging results that were available during my care of the patient were reviewed by me and considered in my medical decision making (see chart for details).    MDM Rules/Calculators/A&P                          45 year old female who is unvaccinated against COVID-19 presenting to the ED today with complaint of productive cough and shortness of breath for the past 2 days.  Typically wears oxygen as needed, presented to the ED today without her oxygen and was found to be hypoxic at 87%.  Quickly went to 98% on normal 2 L.  She is also found  to be febrile at 101.1, nontachycardic, mildly tachypneic with respirations of 22.  She had a rapid Covid test while in triage which has returned positive.  On exam patient appears to be in no acute distress.  She is calling multiple family members during my examination to let them know that she is Covid positive at this time.  She is able speak in full sentences without difficulty.  She does have some diminished breath sounds at the bases and given her history of significant congestive heart failure with EF in the 30s we will plan for lab work and chest x-ray and EKG.  Will provide Tylenol for fever and continue to monitor.  Given patient is on her typical 2 L and she has not had to increase it I do feel she can likely be discharged home.  When I discussed this with patient she seems hesitant as she does not have anyone to help her watch her children at home and she feels too weak to do this herself.  CXR clear without signs of infection or pulmonary vascular congestion CBC without leukocytosis. Hgb stable  at 10.9  Hemoglobin  Date Value Ref Range Status  06/09/2020 10.9 (L) 12.0 - 15.0 g/dL Final  05/01/2020 11.1 (L) 12.0 - 15.0 g/dL Final  04/02/2020 12.0 12.0 - 15.0 g/dL Final  03/30/2020 11.2 (L) 12.0 - 15.0 g/dL Final   BMP with potassium 2.9; will replete. Glucose 245, bicarb stable at 27 and no gap. Creatinine slightly elevated at 1.06 however GFR> 60 BNP has returned significantly elevated at 1,187.0. It appears pt was in the ED in December for SOB and had a BNP in the 800s. She was offered IV diuresis at that time however decided to go home instead. On further evaluation pt resting comfortably with her oxygen on. She does state she has been compliant with her torsemide 80 mg daily; suspect COVID is driving her into a CHF exacerbation. Had discussed admission vs IV diuresis and discharge home however given her significantly elevated BNP and COVID positive with an EF 30% I do feel pt would  benefit from admission at this time. Attending physician Dr. Laverta Baltimore agrees with plan. Will call for admission.   Discussed case with Dr. [name]; will evaluate patient for admission however may not require admission at this time. Will await her recommendations.   At shift change case signed out to Sharyn Lull, PA-C, who will dispo patient accordingly after Dr. [name] evaluates patient.   This note was prepared using Dragon voice recognition software and may include unintentional dictation errors due to the inherent limitations of voice recognition software.  Jennalyn Pozo was evaluated in Emergency Department on 06/09/2020 for the symptoms described in the history of present illness. She was evaluated in the context of the global COVID-19 pandemic, which necessitated consideration that the patient might be at risk for infection with the SARS-CoV-2 virus that causes COVID-19. Institutional protocols and algorithms that pertain to the evaluation of patients at risk for COVID-19 are in a state of rapid change based on information released by regulatory bodies including the CDC and federal and state organizations. These policies and algorithms were followed during the patient's care in the ED.    Final Clinical Impression(s) / ED Diagnoses Final diagnoses:  COVID-19  Acute on chronic congestive heart failure, unspecified heart failure type ([place])  Hypokalemia    Rx / DC Orders ED Discharge Orders    None       Eustaquio Maize, PA-C 06/09/20 1809    Margette Fast, MD 06/10/20 1209

## 2020-06-09 NOTE — H&P (Signed)
History and Physical    Dustine Bertini TIW:[id number] DOB: [birth year] DOA: 06/09/2020  PCP: Rosita Fire, MD   Patient coming from: Home  I have personally briefly reviewed patient's old medical records in [place]  Chief Complaint: Difficulty breathing  HPI: Chelsey Santiago is a 45 y.o. female with medical history significant for CHF, diabetes mellitus, OSA on CPAP, hypertension. Patient presented to the ED with complaints of difficulty breathing and productive cough.  She reports symptoms started about 2 to 3 days ago.  She reports lower central rib/sternal pain from coughing.  Patient is on 2 L home O2 that she uses as needed, she last used it about a week ago at night.  With onset of difficulty breathing she had to use her O2 yesterday night. She reports bilateral lower extremity swelling for the past week.  She reports some weight loss over the past 2 weeks due poor appetite, but she does not have a scale so she does not check her weight. She reports compliance with her medications which include torsemide.  Patient is unvaccinated.  ED Course: Temperature 101.1.  Heart rate 86-93.  Respiratory rate 22-24.  O2 sat 87% on room air, currently on 2 L nasal cannula with sats 98 -  100%.  Potassium 2.9.  BNP elevated 1187.  EKG without significant abnormality.  Chest x-ray without acute abnormality.   Hospitalist to admit.  Review of Systems: As per HPI all other systems reviewed and negative.  Past Medical History:  Diagnosis Date  . Anemia    H&H of 10.6/33 and 07/2008 and 11.9/35 and 09/2010  . Anxiety   . Chronic combined systolic and diastolic CHF (congestive heart failure) ([place])   . Depression with anxiety   . Diabetes mellitus without complication ([place])   . Hypertension   . Hypertensive heart disease 2009   Pulmonary edema postpartum; mild to moderate mitral regurgitation when hospitalized for CHF in 2009; Echocardiogram in 12/2009-no MR and normal EF; normal CXR in  09/2010  . Migraine headache   . Miscarriage 03/19/2013  . Obesity 04/16/2009  . Osteoarthritis, knee 03/29/2011  . Preeclampsia   . Pulmonary edema   . Sleep apnea   . Threatened abortion in early pregnancy 03/15/2013    Past Surgical History:  Procedure Laterality Date  . BREAST REDUCTION SURGERY  2002  . CARDIAC CATHETERIZATION N/A 12/22/2015   Procedure: Left Heart Cath and Coronary Angiography;  Surgeon: Peter M Martinique, MD;  Location: [facility];  Service: Cardiovascular;  Laterality: N/A;  . CESAREAN SECTION N/A 04/09/2014   Procedure: CESAREAN SECTION;  Surgeon: Mora Bellman, MD;  Location: [facility];  Service: Obstetrics;  Laterality: N/A;  . CHOLECYSTECTOMY    . HYSTERECTOMY ABDOMINAL WITH SALPINGECTOMY N/A 09/26/2019   Procedure: HYSTERECTOMY ABDOMINAL WITH SALPINGECTOMY;  Surgeon: Florian Buff, MD;  Location: [facility];  Service: Gynecology;  Laterality: N/A;  . LIPOMA EXCISION Right 09/26/2019   Procedure: EXCISION LIPOMA RIGHT VULVAR;  Surgeon: Florian Buff, MD;  Location: [facility];  Service: Gynecology;  Laterality: Right;     reports that she has never smoked. She has never used smokeless tobacco. She reports current alcohol use. She reports that she does not use drugs.  Allergies  Allergen Reactions  . Diclofenac Swelling    AND POSSIBLE SYNCOPE; tolerates ibuprofen per pt  . Tramadol Nausea And Vomiting and Nausea Only    Itching (12/21); tolerates ibuprofen per pt  . Vicodin [Hydrocodone-Acetaminophen] Itching and Nausea Only  Family History  Problem Relation Age of Onset  . Diabetes Mother 68  . Heart disease Mother   . Hyperlipidemia Paternal Grandfather   . Hypertension Paternal Grandfather   . Heart disease Father   . Hypertension Father   . Heart disease Maternal Grandmother 60  . ADD / ADHD Son   . Hypertension Maternal Uncle   . Heart attack Brother   . Sudden death Neg Hx   . Colon cancer Neg Hx   . Celiac disease Neg Hx   . Inflammatory  bowel disease Neg Hx     Prior to Admission medications   Medication Sig Start Date End Date Taking? Authorizing Provider  albuterol (VENTOLIN HFA) 108 (90 Base) MCG/ACT inhaler Inhale 1 puff into the lungs every 6 (six) hours as needed for wheezing or shortness of breath. 03/30/20   Kathie Dike, MD  amLODipine (NORVASC) 10 MG tablet Take 1 tablet (10 mg total) by mouth daily. 09/04/19 03/28/20  Strader, Fransisco Hertz, PA-C  amoxicillin-clavulanate (AUGMENTIN) 875-125 MG tablet Take 1 tablet by mouth every 12 (twelve) hours. 03/30/20   Kathie Dike, MD  ARIPiprazole (ABILIFY) 10 MG tablet Take 1 tablet (10 mg total) by mouth daily. 10/30/18   Barton Dubois, MD  aspirin EC 81 MG tablet Take 81 mg by mouth daily.    [provider]  budesonide-formoterol (SYMBICORT) 80-4.5 MCG/ACT inhaler Inhale 2 puffs into the lungs 2 (two) times daily. 03/30/20   Kathie Dike, MD  carvedilol (COREG) 25 MG tablet Take 2 tablets (50 mg total) by mouth 2 (two) times daily with a meal. 01/23/20   Branch, Alphonse Guild, MD  clonazePAM (KLONOPIN) 0.5 MG tablet Take 0.5 mg by mouth 2 (two) times daily. 12/19/18   [provider]  dextromethorphan-guaiFENesin (MUCINEX DM) 30-600 MG 12hr tablet Take 1 tablet by mouth 2 (two) times daily. 03/30/20   Kathie Dike, MD  gabapentin (NEURONTIN) 600 MG tablet Take 600 mg by mouth 3 (three) times daily.     [provider]  hydrALAZINE (APRESOLINE) 100 MG tablet Take 1 tablet (100 mg total) by mouth 3 (three) times daily. 01/23/20   Arnoldo Lenis, MD  insulin glargine (LANTUS) 100 UNIT/ML injection Inject 30 Units at bedtime into the skin.     [provider]  insulin lispro (HUMALOG) 100 UNIT/ML injection Inject 15 Units into the skin 3 (three) times daily before meals.     [provider]  Ipratropium-Albuterol (COMBIVENT RESPIMAT) 20-100 MCG/ACT AERS respimat Inhale 1 puff into the lungs every 6 (six) hours as needed for  wheezing or shortness of breath. 05/04/18   Manuella Ghazi, Pratik D, DO  linaclotide Rolan Lipa) 145 MCG CAPS capsule Take 1 capsule (145 mcg total) by mouth daily before breakfast. 03/14/19   Erenest Rasher, PA-C  nitroGLYCERIN (NITROSTAT) 0.4 MG SL tablet Place 1 tablet (0.4 mg total) under the tongue every 5 (five) minutes as needed for chest pain. 09/05/15   Kathie Dike, MD  ondansetron (ZOFRAN ODT) 8 MG disintegrating tablet Take 1 tablet (8 mg total) by mouth every 8 (eight) hours as needed for nausea or vomiting. 09/06/19   Long, Wonda Olds, MD  potassium chloride (KLOR-CON) 10 MEQ tablet Take 1 tablet (10 mEq total) by mouth daily for 14 days. 05/01/20 05/15/20  Corena Herter, PA-C  potassium chloride SA (KLOR-CON M20) 20 MEQ tablet Take 2 tablets (40 mEq total) by mouth daily. 09/04/19 03/28/20  Strader, Fransisco Hertz, PA-C  potassium chloride SA (KLOR-CON) 20  MEQ tablet Take 1 tablet (20 mEq total) by mouth 2 (two) times daily. Patient not taking: Reported on 03/28/2020 03/12/20   Fredia Sorrow, MD  predniSONE (DELTASONE) 10 MG tablet Take 40mg  po daily for 2 days then 30mg  daily for 2 days then 20mg  daily for 2 days then 10mg  daily for 2 days then stop 03/30/20   Kathie Dike, MD  promethazine-codeine (PHENERGAN WITH CODEINE) 6.25-10 MG/5ML syrup Take 5 mLs by mouth every 4 (four) hours as needed for cough. 03/30/20   Kathie Dike, MD  pyridostigmine (MESTINON) 60 MG tablet Take 60 mg by mouth every 8 (eight) hours.    [provider]  sacubitril-valsartan (ENTRESTO) 97-103 MG Take 1 tablet by mouth 2 (two) times daily. 03/16/19   Arnoldo Lenis, MD  torsemide (DEMADEX) 20 MG tablet Take 4 tablets (80 mg total) by mouth daily. 03/30/20 06/28/20  Kathie Dike, MD  traZODone (DESYREL) 100 MG tablet Take 2 tablets (200 mg total) by mouth at bedtime. 10/29/18   Barton Dubois, MD  triamcinolone ointment (KENALOG) 0.5 % Apply 1 application topically 2 (two) times daily. 12/19/19    Estill Dooms, NP    Physical Exam: Vitals:   06/09/20 1350 06/09/20 1358 06/09/20 1719  BP: (!) 172/96  (!) 142/101  Pulse: 93  86  Resp: (!) 22  (!) 24  Temp: (!) 101.1 F (38.4 C)  99.6 F (37.6 C)  TempSrc: Oral  Oral  SpO2: (!) 87% 98% 100%  Weight: 102.1 kg    Height: 5\' 6"  (1.676 m)      Constitutional: NAD, calm, comfortable Vitals:   06/09/20 1350 06/09/20 1358 06/09/20 1719  BP: (!) 172/96  (!) 142/101  Pulse: 93  86  Resp: (!) 22  (!) 24  Temp: (!) 101.1 F (38.4 C)  99.6 F (37.6 C)  TempSrc: Oral  Oral  SpO2: (!) 87% 98% 100%  Weight: 102.1 kg    Height: 5\' 6"  (1.676 m)     Eyes: PERRL, lids and conjunctivae normal ENMT: Mucous membranes are moist. Neck: normal, supple, no masses, no thyromegaly Respiratory: Normal respiratory effort. No accessory muscle use.  Cardiovascular: Regular rate and rhythm, no murmurs / rubs / gallops.  Trace to +1 bilateral pitting extremity edema to knees.. 2+ pedal pulses.   Abdomen: no tenderness, no masses palpated. No hepatosplenomegaly. Bowel sounds positive.  Musculoskeletal: no clubbing / cyanosis. No joint deformity upper and lower extremities. Good ROM, no contractures. Normal muscle tone.  Skin: no rashes, lesions, ulcers. No induration Neurologic: No apparent cranial abnormality. Psychiatric: Normal judgment and insight. Alert and oriented x 3. Normal mood.   Labs on Admission: I have personally reviewed following labs and imaging studies  CBC: Recent Labs  Lab 06/09/20 1511  WBC 5.9  NEUTROABS 4.8  HGB 10.9*  HCT 35.7*  MCV 74.7*  PLT 0000000   Basic Metabolic Panel: Recent Labs  Lab 06/09/20 1511  NA 133*  K 2.9*  CL 93*  CO2 27  GLUCOSE 245*  BUN 18  CREATININE 1.06*  CALCIUM 8.7*    Radiological Exams on Admission: DG Chest Port 1 View  Result Date: 06/09/2020 CLINICAL DATA:  Cough, COVID positive EXAM: PORTABLE CHEST 1 VIEW COMPARISON:  05/01/2020 FINDINGS: Stable cardiomediastinal  contours with mild cardiomegaly. Both lungs are clear. No pleural effusion or pneumothorax. The visualized skeletal structures are unremarkable. IMPRESSION: No acute process in the chest.  Stable mild cardiomegaly. Electronically Signed   By: Addison Lank.D.  On: 06/09/2020 14:31    EKG: Independently reviewed.  Sinus rhythm, rate 87.  QTc 495.  Appears to have biphasic T waves, Otherwise no change from prior.  Assessment/Plan Principal Problem:   Acute on chronic respiratory failure with hypoxia (HCC) Active Problems:   Morbid obesity (HCC)   OSA on CPAP   Hypertensive cardiovascular disease   Acute on chronic combined systolic and diastolic CHF, NYHA class 4 (HCC)   Pneumonia due to COVID-19 virus  Acute on chronic respiratory failure-O2 sats 87% on room air, currently on 2 L, sats > 98%.  Considering CT findings- etiology likely from Covid pneumonia or pulmonary edema, possibly both.  At baseline she is on 2 L of oxygen as needed. -Supplemental O2  Decompensated systolic CHF-pitting extremity edema, elevated BNP at 1187.  Per charts weights appear stable.  Last echo 03/2020 EF 30 to 35% with grade 2 diastolic dysfunction.  Home medications torsemide 80mg  daily.  -Trend Troponin -Strict input output, daily weights -BMP daily -IV Lasix 60 twice daily -Resume Entresto -Hold home torsemide.  COVID-19 infection-febrile to 101.1, ~ 3 days into symptom onset.  Unvaccinated.  CT findings of bilateral patchy groundglass opacities without the peripheral predilection.  Favor edema although infection cannot be excluded. -IV remdesivir -Dexamethasone 10 mg x 1 continue 6 mg daily -Pending clinical course consider d/c dexamethasone considering diabetes, and as symptoms and lung imaging findings may be from CHF and not Covid. -Trend inflammatory markers -As needed, flutter valve, incentive spirometry  Obstructive sleep apnea  Hypertension-elevated. -Resume Norvasc, carvedilol,  hydralazine pending med reconciliation  DM-random glucose 245. Hgba1c- 10.6. - SSI- M -Resume home medication Lantus 30 units nightly   DVT prophylaxis: Lovenox Code Status: Full code Family Communication: None at bedside. Disposition Plan: ~ 2 days Consults called: None Admission status: inpt, tele I certify that at the point of admission it is my clinical judgment that the patient will require inpatient hospital care spanning beyond 2 midnights from the point of admission due to high intensity of service, high risk for further deterioration and high frequency of surveillance required.    Bethena Roys MD [facility]  06/09/2020, 9:00 PM

## 2020-06-09 NOTE — ED Notes (Signed)
Admitting provider at bedside at this time.

## 2020-06-09 NOTE — ED Notes (Addendum)
Entered room and introduced self to patient. Pt appears to be resting in bed, respirations are even and unlabored with equal chest rise and fall. Bed is locked in the lowest position, side rails x2, call bell within reach. Pt educated on call light use and hourly rounding, verbalized understanding and in agreement at this time. All questions and concerns voiced addressed. Refreshments offered and provided per patient request.  Will continue to monitor.   Pt up and to restroom at this time. Pt ambulated with a stand by assistance from staff, reports an increase in generalized weakness. Pt assisted back into bed, bed is locked in the lowest position, side rails x2, call bell within reach.

## 2020-06-09 NOTE — ED Provider Notes (Signed)
Care received from [facility].  Please see her note for full HPI.  In short, 45 year old female who was positive for Covid complaining of shortness of breath and cough.  BNP more than 2000, patient is not compliant with her home torsemide.  Shared decision making was made with the patient, it was decided that it would be in the best interest of the patient to be admitted given COVID-19 diagnosis, and heart failure exacerbation.  Her provider had spoken with admitting provider Dr. [name] who planned to evaluate the patient to see if she qualifies for admission.  Care signed out pending Dr. Talmadge Coventry decision.  Dr. [name] will admit the patient for further evaluation and treatment of her COVID-19 and heart failure exacerbation.   Lyndel Safe 06/09/20 1925    Margette Fast, MD 06/10/20 1209

## 2020-06-09 NOTE — ED Notes (Signed)
Patient o2 stat while walking is 100% on 2l of o2.

## 2020-06-09 NOTE — ED Triage Notes (Signed)
C/o shortness of breath, states she is on oxygen at home

## 2020-06-10 DIAGNOSIS — I110 Hypertensive heart disease with heart failure: Secondary | ICD-10-CM

## 2020-06-10 LAB — CBC WITH DIFFERENTIAL/PLATELET
Abs Immature Granulocytes: 0.03 10*3/uL (ref 0.00–0.07)
Basophils Absolute: 0 10*3/uL (ref 0.0–0.1)
Basophils Relative: 0 %
Eosinophils Absolute: 0 10*3/uL (ref 0.0–0.5)
Eosinophils Relative: 0 %
HCT: 35.8 % — ABNORMAL LOW (ref 36.0–46.0)
Hemoglobin: 10.9 g/dL — ABNORMAL LOW (ref 12.0–15.0)
Immature Granulocytes: 1 %
Lymphocytes Relative: 17 %
Lymphs Abs: 0.8 10*3/uL (ref 0.7–4.0)
MCH: 22.9 pg — ABNORMAL LOW (ref 26.0–34.0)
MCHC: 30.4 g/dL (ref 30.0–36.0)
MCV: 75.1 fL — ABNORMAL LOW (ref 80.0–100.0)
Monocytes Absolute: 0.1 10*3/uL (ref 0.1–1.0)
Monocytes Relative: 2 %
Neutro Abs: 3.5 10*3/uL (ref 1.7–7.7)
Neutrophils Relative %: 80 %
Platelets: 225 10*3/uL (ref 150–400)
RBC: 4.77 MIL/uL (ref 3.87–5.11)
RDW: 15.7 % — ABNORMAL HIGH (ref 11.5–15.5)
WBC: 4.4 10*3/uL (ref 4.0–10.5)
nRBC: 0 % (ref 0.0–0.2)

## 2020-06-10 LAB — COMPREHENSIVE METABOLIC PANEL
ALT: 22 U/L (ref 0–44)
AST: 28 U/L (ref 15–41)
Albumin: 3 g/dL — ABNORMAL LOW (ref 3.5–5.0)
Alkaline Phosphatase: 79 U/L (ref 38–126)
Anion gap: 12 (ref 5–15)
BUN: 20 mg/dL (ref 6–20)
CO2: 29 mmol/L (ref 22–32)
Calcium: 8.2 mg/dL — ABNORMAL LOW (ref 8.9–10.3)
Chloride: 94 mmol/L — ABNORMAL LOW (ref 98–111)
Creatinine, Ser: 0.9 mg/dL (ref 0.44–1.00)
GFR, Estimated: >60 mL/min (ref >60)
Glucose, Bld: 398 mg/dL — ABNORMAL HIGH (ref 70–99)
Potassium: 3.8 mmol/L (ref 3.5–5.1)
Sodium: 135 mmol/L (ref 135–145)
Total Bilirubin: 0.6 mg/dL (ref 0.3–1.2)
Total Protein: 7.3 g/dL (ref 6.5–8.1)

## 2020-06-10 LAB — FERRITIN: Ferritin: 52 ng/mL (ref 11–307)

## 2020-06-10 LAB — D-DIMER, QUANTITATIVE: D-Dimer, Quant: 1.28 ug/mL-FEU — ABNORMAL HIGH (ref 0.00–0.50)

## 2020-06-10 LAB — CBG MONITORING, ED
Glucose-Capillary: 181 mg/dL — ABNORMAL HIGH (ref 70–99)
Glucose-Capillary: 214 mg/dL — ABNORMAL HIGH (ref 70–99)
Glucose-Capillary: 329 mg/dL — ABNORMAL HIGH (ref 70–99)
Glucose-Capillary: 349 mg/dL — ABNORMAL HIGH (ref 70–99)
Glucose-Capillary: 437 mg/dL — ABNORMAL HIGH (ref 70–99)

## 2020-06-10 LAB — HEMOGLOBIN A1C
Hgb A1c MFr Bld: 10.7 % — ABNORMAL HIGH (ref 4.8–5.6)
Mean Plasma Glucose: 260.39 mg/dL

## 2020-06-10 LAB — C-REACTIVE PROTEIN: CRP: 2.1 mg/dL — ABNORMAL HIGH (ref <1.0)

## 2020-06-10 LAB — MAGNESIUM: Magnesium: 1.4 mg/dL — ABNORMAL LOW (ref 1.7–2.4)

## 2020-06-10 LAB — PHOSPHORUS: Phosphorus: 3.6 mg/dL (ref 2.5–4.6)

## 2020-06-10 MED ORDER — CLONAZEPAM 0.5 MG PO TABS: 0.5000 mg | ORAL_TABLET | Freq: Two times a day (BID) | ORAL | Status: DC | PRN

## 2020-06-10 MED ORDER — INSULIN ASPART 100 UNIT/ML ~~LOC~~ SOLN
15.0000 [IU] | Freq: Three times a day (TID) | SUBCUTANEOUS | Status: DC
  Filled 2020-06-10: qty 1

## 2020-06-10 MED ORDER — INSULIN ASPART 100 UNIT/ML ~~LOC~~ SOLN
0.0000 [IU] | Freq: Every day | SUBCUTANEOUS | Status: DC
  Administered 2020-06-10: 2 [IU] via SUBCUTANEOUS
  Filled 2020-06-10: qty 1

## 2020-06-10 MED ORDER — ARIPIPRAZOLE 10 MG PO TABS
10.0000 mg | ORAL_TABLET | Freq: Every day | ORAL | Status: DC
  Administered 2020-06-10 – 2020-06-11 (×2): 10 mg via ORAL
  Filled 2020-06-10: qty 2
  Filled 2020-06-10: qty 1

## 2020-06-10 MED ORDER — INSULIN GLARGINE 100 UNIT/ML ~~LOC~~ SOLN
60.0000 [IU] | Freq: Every day | SUBCUTANEOUS | Status: DC
  Administered 2020-06-10: 60 [IU] via SUBCUTANEOUS
  Filled 2020-06-10: qty 0.6

## 2020-06-10 MED ORDER — LINACLOTIDE 145 MCG PO CAPS
145.0000 ug | ORAL_CAPSULE | Freq: Every day | ORAL | Status: DC
  Administered 2020-06-11: 145 ug via ORAL
  Filled 2020-06-10: qty 1

## 2020-06-10 MED ORDER — ENOXAPARIN SODIUM 60 MG/0.6ML ~~LOC~~ SOLN
50.0000 mg | SUBCUTANEOUS | Status: DC
  Administered 2020-06-10: 50 mg via SUBCUTANEOUS
  Filled 2020-06-10: qty 0.6

## 2020-06-10 MED ORDER — GABAPENTIN 300 MG PO CAPS
600.0000 mg | ORAL_CAPSULE | Freq: Every day | ORAL | Status: DC
  Administered 2020-06-10: 600 mg via ORAL
  Filled 2020-06-10: qty 2

## 2020-06-10 MED ORDER — INSULIN GLARGINE 100 UNIT/ML ~~LOC~~ SOLN
40.0000 [IU] | Freq: Every day | SUBCUTANEOUS | Status: DC
  Filled 2020-06-10: qty 0.4

## 2020-06-10 MED ORDER — INSULIN ASPART 100 UNIT/ML ~~LOC~~ SOLN
0.0000 [IU] | Freq: Three times a day (TID) | SUBCUTANEOUS | Status: DC
  Administered 2020-06-10: 4 [IU] via SUBCUTANEOUS
  Administered 2020-06-10: 15 [IU] via SUBCUTANEOUS
  Administered 2020-06-10: 20 [IU] via SUBCUTANEOUS
  Administered 2020-06-11: 15 [IU] via SUBCUTANEOUS
  Administered 2020-06-11: 7 [IU] via SUBCUTANEOUS
  Filled 2020-06-10 (×3): qty 1

## 2020-06-10 MED ORDER — AMLODIPINE BESYLATE 5 MG PO TABS
10.0000 mg | ORAL_TABLET | Freq: Every day | ORAL | Status: DC
Start: 2020-06-10 — End: 2020-06-11
  Administered 2020-06-10 – 2020-06-11 (×2): 10 mg via ORAL
  Filled 2020-06-10 (×2): qty 2

## 2020-06-10 MED ORDER — CARVEDILOL 12.5 MG PO TABS
12.5000 mg | ORAL_TABLET | Freq: Two times a day (BID) | ORAL | Status: DC
  Administered 2020-06-10 – 2020-06-11 (×2): 12.5 mg via ORAL
  Filled 2020-06-10 (×2): qty 1

## 2020-06-10 MED ORDER — TRAZODONE HCL 50 MG PO TABS
200.0000 mg | ORAL_TABLET | Freq: Every day | ORAL | Status: DC
  Administered 2020-06-10: 200 mg via ORAL
  Filled 2020-06-10: qty 4

## 2020-06-10 MED ORDER — HYDRALAZINE HCL 25 MG PO TABS
50.0000 mg | ORAL_TABLET | Freq: Three times a day (TID) | ORAL | Status: DC
  Administered 2020-06-10 – 2020-06-11 (×5): 50 mg via ORAL
  Filled 2020-06-10 (×5): qty 2

## 2020-06-10 MED ORDER — MAGNESIUM SULFATE 4 GM/100ML IV SOLN
4.0000 g | Freq: Once | INTRAVENOUS | Status: AC
  Administered 2020-06-10: 4 g via INTRAVENOUS
  Filled 2020-06-10: qty 100

## 2020-06-10 MED ORDER — INSULIN ASPART 100 UNIT/ML ~~LOC~~ SOLN
10.0000 [IU] | Freq: Three times a day (TID) | SUBCUTANEOUS | Status: DC
  Administered 2020-06-10 (×2): 10 [IU] via SUBCUTANEOUS
  Filled 2020-06-10: qty 1

## 2020-06-10 MED ORDER — METHYLPREDNISOLONE SODIUM SUCC 40 MG IJ SOLR
40.0000 mg | Freq: Two times a day (BID) | INTRAMUSCULAR | Status: DC
  Administered 2020-06-10 – 2020-06-11 (×3): 40 mg via INTRAVENOUS
  Filled 2020-06-10 (×3): qty 1

## 2020-06-10 MED ORDER — IPRATROPIUM-ALBUTEROL 20-100 MCG/ACT IN AERS
1.0000 | INHALATION_SPRAY | Freq: Four times a day (QID) | RESPIRATORY_TRACT | Status: DC
  Administered 2020-06-10: 1 via RESPIRATORY_TRACT
  Filled 2020-06-10: qty 4

## 2020-06-10 MED ORDER — INSULIN ASPART 100 UNIT/ML ~~LOC~~ SOLN
20.0000 [IU] | Freq: Three times a day (TID) | SUBCUTANEOUS | Status: DC
  Administered 2020-06-10: 20 [IU] via SUBCUTANEOUS
  Filled 2020-06-10: qty 1

## 2020-06-10 MED ORDER — IPRATROPIUM-ALBUTEROL 20-100 MCG/ACT IN AERS
1.0000 | INHALATION_SPRAY | Freq: Two times a day (BID) | RESPIRATORY_TRACT | Status: DC
  Administered 2020-06-11: 1 via RESPIRATORY_TRACT

## 2020-06-10 MED ORDER — POTASSIUM CHLORIDE CRYS ER 20 MEQ PO TBCR
20.0000 meq | EXTENDED_RELEASE_TABLET | Freq: Two times a day (BID) | ORAL | Status: DC
  Administered 2020-06-10 – 2020-06-11 (×2): 20 meq via ORAL
  Filled 2020-06-10 (×2): qty 1

## 2020-06-10 MED ORDER — MOMETASONE FURO-FORMOTEROL FUM 100-5 MCG/ACT IN AERO
2.0000 | INHALATION_SPRAY | Freq: Two times a day (BID) | RESPIRATORY_TRACT | Status: DC
  Administered 2020-06-10 – 2020-06-11 (×2): 2 via RESPIRATORY_TRACT
  Filled 2020-06-10: qty 8.8

## 2020-06-10 MED ORDER — PYRIDOSTIGMINE BROMIDE 60 MG PO TABS
60.0000 mg | ORAL_TABLET | Freq: Three times a day (TID) | ORAL | Status: DC
  Administered 2020-06-10 – 2020-06-11 (×3): 60 mg via ORAL
  Filled 2020-06-10 (×3): qty 1

## 2020-06-10 MED ORDER — SACUBITRIL-VALSARTAN 97-103 MG PO TABS
1.0000 | ORAL_TABLET | Freq: Two times a day (BID) | ORAL | Status: DC
  Administered 2020-06-10 – 2020-06-11 (×2): 1 via ORAL
  Filled 2020-06-10 (×6): qty 1

## 2020-06-10 MED ORDER — ASPIRIN EC 81 MG PO TBEC
81.0000 mg | DELAYED_RELEASE_TABLET | Freq: Every day | ORAL | Status: DC
  Administered 2020-06-10 – 2020-06-11 (×2): 81 mg via ORAL
  Filled 2020-06-10 (×2): qty 1

## 2020-06-10 NOTE — TOC Initial Note (Signed)
Transition of Care [facility]) - Initial/Assessment Note    Patient Details  Name: Chelsey Santiago MRN: [id number] Date of Birth: Sep 10, 1975  [facility]) CM/SW Contact:    Iona Beard, [place] Phone Number: 06/10/2020, 7:26 PM  Clinical Narrative:                 Pt admitted for Acute on chronic respiratory failure with hypoxia. Pt is high risk for readmission. CSW spoke with pt over the phone to complete assessment. Pt states that it is her and her children in the home. Pt states she is going to have an aide in the home set up through her [employer] when she is out of the hospital. Pt drives. Pt has not had [place] services. Pt uses a cane and is supplied 2-3L O2 through [employer]. Pt is interested in getting a bedside commode at d/c. TOC to follow.   Expected Discharge Plan: Home/Self Care Barriers to Discharge: Continued Medical Work up   Patient Goals and CMS Choice Patient states their goals for this hospitalization and ongoing recovery are:: Return home   Choice offered to / list presented to : NA  Expected Discharge Plan and Services Expected Discharge Plan: Home/Self Care In-house Referral: NA Discharge Planning Services: NA Post Acute Care Choice: Durable Medical Equipment Living arrangements for the past 2 months: Single Family Home                           HH Arranged: NA [place] Agency: NA        Prior Living Arrangements/Services Living arrangements for the past 2 months: Single Family Home Lives with:: Minor Children Patient language and need for interpreter reviewed:: Yes Do you feel safe going back to the place where you live?: Yes      Need for Family Participation in Patient Care: No (Comment) Care giver support system in place?: Yes (comment) Current home services: Homehealth aide Criminal Activity/Legal Involvement Pertinent to Current Situation/Hospitalization: No - Comment as needed  Activities of Daily Living      Permission  Sought/Granted                  Emotional Assessment Appearance:: Appears stated age Attitude/Demeanor/Rapport: Engaged Affect (typically observed): Accepting Orientation: : Oriented to Self,Oriented to Place,Oriented to  Time,Oriented to Situation Alcohol / Substance Use: Not Applicable Psych Involvement: No (comment)  Admission diagnosis:  Acute respiratory failure with hypoxia ([place]) [J96.01] Patient Active Problem List   Diagnosis Date Noted  . Pneumonia due to COVID-19 virus 06/09/2020  . Acute on chronic respiratory failure with hypoxia ([place]) 06/09/2020  . Obstructive sleep apnea 03/29/2020  . Flash pulmonary edema ([place]) 03/27/2020  . Back pain 12/19/2019  . Vaginal discharge 12/19/2019  . Vaginal itching 12/19/2019  . BV (bacterial vaginosis) 12/19/2019  . Chronic hypertension 12/19/2019  . Fibroids 09/26/2019  . S/P hysterectomy 09/26/2019  . Acute blood loss anemia 09/26/2019  . Abdominal pain, epigastric 03/14/2019  . Dysphagia 03/14/2019  . Gastroesophageal reflux disease 03/14/2019  . Class 2 obesity   . Acute exacerbation of CHF (congestive heart failure) ([place]) 10/26/2018  . Chronic combined systolic and diastolic CHF (congestive heart failure) ([place]) 06/02/2018  . Uncontrolled type 2 diabetes mellitus with hyperglycemia ([place]) 06/02/2018  . Influenza B 05/13/2018  . Hypomagnesemia 05/12/2018  . Headache 05/12/2018  . Upper respiratory tract infection   . HCAP (healthcare-associated pneumonia)   . Constipation 10/17/2017  . Bad  headache   . CHF exacerbation ([place]) 09/29/2017  . Iron deficiency anemia 05/16/2017  . Vitamin D deficiency 11/25/2016  . Iron deficiency 11/25/2016  . History of acute myocardial infarction 10/05/2016  . CHF (congestive heart failure) ([place]) 05/06/2016  . Diabetes mellitus with complication ([place]) 37/34/2876  . Leukocytosis 05/06/2016  . Neuropathy 05/06/2016  . Depression 04/16/2016  . Chronic tension-type headache, not  intractable 04/16/2016  . AKI (acute kidney injury) ([place])   . Hyperkalemia   . Nonischemic cardiomyopathy ([place])   . Acute on chronic combined systolic and diastolic ACC/AHA stage C congestive heart failure ([place]) 04/03/2016  . Acute on chronic combined systolic and diastolic CHF, NYHA class 4 ([place]) 04/03/2016  . Hypertensive emergency 04/03/2016  . Cardiomyopathy due to hypertension ([place]) 12/22/2015  . Normal coronary arteries 12/22/2015  . Troponin level elevated 12/22/2015  . NSTEMI (non-ST elevated myocardial infarction) ([place]) 12/20/2015  . Dental infection 10/10/2015  . Chest pain 09/05/2015  . Systolic CHF, chronic ([place]) 09/05/2015  . LLQ pain   . Type 2 diabetes mellitus without complication ([place]) 81/15/7262  . Essential hypertension   . 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 Hypertensive urgency 04/22/2014  . Acute CHF ([place]) 04/22/2014  . S/P cesarean section 04/11/2014  . Acute pulmonary edema ([place]) 04/11/2014  . Postoperative anemia 04/11/2014  . Elevated serum creatinine 04/11/2014  . Preeclampsia, severe 04/09/2014  . Pre-eclampsia superimposed on chronic hypertension, antepartum 04/08/2014  . Dyspnea 04/08/2014  . Polyhydramnios in third trimester, antepartum 03/14/2014  . Abnormal maternal glucose tolerance, antepartum 03/11/2014  . High-risk pregnancy 03/11/2014  . Pre-existing essential hypertension complicating pregnancy 03/55/9741  . Impaired glucose tolerance during pregnancy, antepartum 11/27/2013  . Leiomyoma of uterus 11/22/2013  . History of gestational diabetes in prior pregnancy, currently pregnant in first trimester 11/22/2013  . Hx of preeclampsia, prior pregnancy, currently pregnant 11/22/2013  . Short interval between pregnancies affecting pregnancy, antepartum 11/22/2013  . Supervision of high-risk pregnancy of elderly primigravida (>= 20 years old at delivery), third trimester 11/22/2013  . Miscarriage 03/19/2013  . Major depressive disorder, single  episode, unspecified 09/27/2011  . Hypertension   . Hypertensive cardiovascular disease   . Microcytic anemia   . Osteoarthrosis involving lower leg 03/29/2011  . Hypokalemia 12/12/2009  . OSA on CPAP 12/09/2009  . Morbid obesity ([place]) 04/16/2009   PCP:  Rosita Fire, MD Pharmacy:   [facility], [address] [place] [address] Phone: [phone] Fax: [phone]  [place], [place] - [place] [address] [place] [address] Phone: [phone] Fax: [phone]     Social Determinants of Health (SDOH) Interventions    Readmission Risk Interventions Readmission Risk Prevention Plan 06/10/2020 03/28/2020  Transportation Screening Complete Complete  HRI or [place] - Complete  Social Work Consult for [place] Planning/Counseling - Complete  Palliative Care Screening - Not Applicable  Medication Review Press photographer) Complete Complete  HRI or Home Care Consult Complete -  SW Recovery Care/Counseling Consult Complete -  Palliative Care Screening Not Applicable -  [place] Not Applicable -  Some recent data might be hidden

## 2020-06-10 NOTE — Progress Notes (Signed)
PROGRESS NOTE   Chelsey Santiago  QQV:[id number] DOB: 24-Jan-1976 DOA: 06/09/2020 PCP: Rosita Fire, MD   Chief Complaint  Patient presents with  . Shortness of Breath  . Covid Positive   Level of care: Telemetry  Brief Admission History:  45 y.o. female with medical history significant for CHF, diabetes mellitus, OSA on CPAP, hypertension.  Patient presented to the ED with complaints of difficulty breathing and productive cough.  She reports symptoms started about 2 to 3 days ago.  She reports lower central rib/sternal pain from coughing.  Patient is on 2 L home O2 that she uses as needed, she last used it about a week ago at night.  With onset of difficulty breathing she had to use her O2 yesterday night.  She reports bilateral lower extremity swelling for the past week.  She reports some weight loss over the past 2 weeks due poor appetite, but she does not have a scale so she does not check her weight. She reports compliance with her medications which include torsemide.    Assessment & Plan:   Principal Problem:   Acute on chronic respiratory failure with hypoxia (HCC) Active Problems:   Morbid obesity (HCC)   OSA on CPAP   Hypertensive cardiovascular disease   Acute on chronic combined systolic and diastolic CHF, NYHA class 4 ([place])   Pneumonia due to COVID-19 virus  1. Acute on chronic respiratory failure with hypoxia secondary to Covid, CHF exacerbation and noncompliance with medication regimen - Pt remains volume overloaded and is being diuresed with IV lasix 60 mg every 12 hours.  She is on supplemental oxygen as needed. Empiric treatment started for Covid infection including remdesivir, IV solumedrol. Follow and trend inflammatory markers. Continue bronchodilators.  Resumed home dose entresto.  Monitor I/O and weights. Follow electrolytes closely.   2. Acute HFrEF - as above, diuresing with IV lasix. Resumed home BP meds including entresto.  3. Uncontrolled hypertension - due  mainly to poor compliance with taking medication.  I have restarted home meds.  Reconciling home meds has been challenging as patient unsure of what she is supposed to be taking. Given BP response likely can increase hydralazine and carvedilol doses.  4. 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 Titrate doses as needed for better glycemic control. Follow up A1c test.   DVT prophylaxis: enoxaparin Code Status: full  Family Communication: care plan discussed with patient at bedside Disposition:  Status is: Inpatient  Remains inpatient appropriate because:IV treatments appropriate due to intensity of illness or inability to take PO and Inpatient level of care appropriate due to severity of illness   Dispo: The patient is from: Home              Anticipated d/c is to: Home              Anticipated d/c date is: 2 days              Patient currently is not medically stable to d/c.   Difficult to place patient No   Consultants:     Procedures:     Antimicrobials:    Subjective: Pt reports that she is urinating a lot.  She has dry hacking cough. No chest pain.  NO SOB.   Objective: Vitals:   06/10/20 1215 06/10/20 1230 06/10/20 1235 06/10/20 1352  BP:  (!) 158/111  (!) 166/105  Pulse: 72 73    Resp: 19 (!) 22    Temp:      TempSrc:      SpO2: 100% 98% 100%   Weight:      Height:        Intake/Output Summary (Last 24 hours) at 06/10/2020 1605 Last data filed at 06/10/2020 0631 Gross per 24 hour  Intake 240 ml  Output 1900 ml  Net -1660 ml   Filed Weights   06/09/20 1350  Weight: 102.1 kg    Examination:  General exam: Appears calm and comfortable  Respiratory system: Clear to auscultation. Respiratory effort normal. Cardiovascular system: normal S1 & S2 heard.  Mild JVD, 1+ pedal edema. Gastrointestinal system: Abdomen  is nondistended, soft and nontender. No organomegaly or masses felt. Normal bowel sounds heard. Central nervous system: Alert and oriented. No focal neurological deficits. Extremities: 2+ edema BLE, Symmetric 5 x 5 power. Skin: No rashes, lesions or ulcers Psychiatry: Judgement and insight appear poor. Mood & affect appropriate.   Data Reviewed: I have personally reviewed following labs and imaging studies  CBC: Recent Labs  Lab 06/09/20 1511 06/10/20 0650  WBC 5.9 4.4  NEUTROABS 4.8 3.5  HGB 10.9* 10.9*  HCT 35.7* 35.8*  MCV 74.7* 75.1*  PLT 261 [id number]    Basic Metabolic Panel: Recent Labs  Lab 06/09/20 1511 06/09/20 2039 06/10/20 0650  NA 133* 132* 135  K 2.9* 3.0* 3.8  CL 93* 91* 94*  CO2 27 29 29   GLUCOSE 245* 199* 398*  BUN 18 17 20   CREATININE 1.06* 0.90 0.90  CALCIUM 8.7* 8.0* 8.2*  MG  --  1.3* 1.4*  PHOS  --   --  3.6    GFR: Estimated Creatinine Clearance: 96.2 mL/min (by C-G formula based on SCr of 0.9 mg/dL).  Liver Function Tests: Recent Labs  Lab 06/09/20 2039 06/10/20 0650  AST 22 28  ALT 18 22  ALKPHOS 75 79  BILITOT 0.7 0.6  PROT 6.7 7.3  ALBUMIN 2.8* 3.0*    CBG: Recent Labs  Lab 06/10/20 0022 06/10/20 0946 06/10/20 1250  GLUCAP 329* 349* 437*    No results found for this or any previous visit (from the past 240 hour(s)).   Radiology Studies: CT ANGIO CHEST PE W OR WO CONTRAST  Result Date: 06/09/2020 CLINICAL DATA:  COVID, shortness of breath EXAM: CT ANGIOGRAPHY CHEST WITH CONTRAST TECHNIQUE: Multidetector CT imaging of the chest was performed using the standard protocol during bolus administration of intravenous contrast. Multiplanar CT image reconstructions and MIPs were obtained to evaluate the vascular anatomy. CONTRAST:  127mL OMNIPAQUE IOHEXOL 350 MG/ML SOLN COMPARISON:  07/04/2019 FINDINGS: Cardiovascular: No filling defects in the pulmonary arteries to suggest pulmonary emboli. Cardiomegaly. Aorta normal caliber.  Mediastinum/Nodes: No mediastinal, hilar, or axillary adenopathy. Trachea and esophagus are unremarkable. Thyroid unremarkable. Lungs/Pleura: Ground-glass opacities within the lungs, favor early edema although infection cannot be completely excluded. No effusions. Upper Abdomen: Imaging into the upper abdomen demonstrates no acute findings. Musculoskeletal: Chest wall soft tissues are unremarkable. No acute bony abnormality. Review of the MIP images confirms the above findings. IMPRESSION: No evidence of pulmonary embolus. Cardiomegaly. Bilateral patchy ground-glass opacities without a peripheral predilection. Favor edema although infection cannot be excluded. Electronically Signed   By: Rolm Baptise M.D.   On: 06/09/2020 19:54   DG Chest Port 1 View  Result Date: 06/09/2020 CLINICAL DATA:  Cough, COVID positive EXAM: PORTABLE CHEST 1 VIEW COMPARISON:  05/01/2020 FINDINGS: Stable cardiomediastinal contours with  mild cardiomegaly. Both lungs are clear. No pleural effusion or pneumothorax. The visualized skeletal structures are unremarkable. IMPRESSION: No acute process in the chest.  Stable mild cardiomegaly. Electronically Signed   By: Macy Mis M.D.   On: 06/09/2020 14:31   Scheduled Meds: . amLODipine  10 mg Oral Daily  . ARIPiprazole  10 mg Oral Daily  . vitamin C  500 mg Oral Daily  . aspirin EC  81 mg Oral Daily  . carvedilol  12.5 mg Oral BID WC  . enoxaparin (LOVENOX) injection  50 mg Subcutaneous Q24H  . furosemide  60 mg Intravenous Q12H  . gabapentin  600 mg Oral QHS  . hydrALAZINE  50 mg Oral TID  . insulin aspart  0-20 Units Subcutaneous TID WC  . insulin aspart  0-5 Units Subcutaneous QHS  . insulin aspart  20 Units Subcutaneous TID WC  . insulin glargine  60 Units Subcutaneous QHS  . Ipratropium-Albuterol  1 puff Inhalation QID  . [START ON 06/11/2020] linaclotide  145 mcg Oral QAC breakfast  . methylPREDNISolone (SOLU-MEDROL) injection  40 mg Intravenous Q12H  .  mometasone-formoterol  2 puff Inhalation BID  . potassium chloride SA  20 mEq Oral BID  . pyridostigmine  60 mg Oral Q8H  . sacubitril-valsartan  1 tablet Oral BID  . traZODone  200 mg Oral QHS  . zinc sulfate  220 mg Oral Daily   Continuous Infusions: . remdesivir 100 mg in NS 100 mL      LOS: 1 day   Time spent: 38 mins   Allani Reber Wynetta Emery, MD How to contact the [facility] Attending or Consulting provider [place] or covering provider during after hours [place], for this patient?  1. Check the care team in [facility] and look for a) attending/consulting TR[facility] provider listed and b) the [facility] team listed 2. Log into [email or web address] and use [place]'s universal password to access. If you do not have the password, please contact the hospital operator. 3. Locate the [facility] provider you are looking for under [facility] and page to a number that you can be directly reached. 4. If you still have difficulty reaching the provider, please page the [facility] (Director on Call) for the Hospitalists listed on amion for assistance.  06/10/2020, 4:05 PM

## 2020-06-10 NOTE — Progress Notes (Signed)
Inpatient Diabetes Program Recommendations  AACE/ADA: New Consensus Statement on Inpatient Glycemic Control (2015)  Target Ranges:  Prepandial:   less than 140 mg/dL      Peak postprandial:   less than 180 mg/dL (1-2 hours)      Critically ill patients:  140 - 180 mg/dL   Lab Results  Component Value Date   GLUCAP 349 (H) 06/10/2020   HGBA1C 10.6 (H) 03/28/2020    Review of Glycemic Control Results for SHAWNEEN, DEETZ (MRN [id number]) as of 06/10/2020 10:23  Ref. Range 06/10/2020 00:22 06/10/2020 09:46  Glucose-Capillary Latest Ref Range: 70 - 99 mg/dL 329 (H) 349 (H)   Diabetes history: Type 2 Dm Outpatient Diabetes medications: Lantus 30 units QHS, Humalog 15 units TID Current orders for Inpatient glycemic control: Lantus 30 units QHS, Humalog 15 units TID, Novolog 0-20 units TID, Novolog 0-5 units QHS Solumedrol 40 mg BID  Inpatient Diabetes Program Recommendations:    Consider: -Adding Tradjenta 5 mg QD -Adding A1C as last result from 03/2020 -increasing Lantus to 20 units BID  Thanks, Bronson Curb, MSN, RNC-OB Diabetes Coordinator [phone] (8a-5p)

## 2020-06-10 NOTE — ED Notes (Signed)
Pt sitting up in bed at this time. Only complaint this morning is a headache. Tylenol given per MAR. Denies chest pain or being SOB. Pt is 100% on RA.

## 2020-06-11 ENCOUNTER — Telehealth: Payer: Self-pay | Admitting: Pulmonary Disease

## 2020-06-11 DIAGNOSIS — J9621 Acute and chronic respiratory failure with hypoxia: Secondary | ICD-10-CM

## 2020-06-11 LAB — CBC WITH DIFFERENTIAL/PLATELET
Abs Immature Granulocytes: 0.03 10*3/uL (ref 0.00–0.07)
Basophils Absolute: 0 10*3/uL (ref 0.0–0.1)
Basophils Relative: 0 %
Eosinophils Absolute: 0 10*3/uL (ref 0.0–0.5)
Eosinophils Relative: 0 %
HCT: 44.2 % (ref 36.0–46.0)
Hemoglobin: 13.2 g/dL (ref 12.0–15.0)
Immature Granulocytes: 0 %
Lymphocytes Relative: 12 %
Lymphs Abs: 0.8 10*3/uL (ref 0.7–4.0)
MCH: 22.5 pg — ABNORMAL LOW (ref 26.0–34.0)
MCHC: 29.9 g/dL — ABNORMAL LOW (ref 30.0–36.0)
MCV: 75.3 fL — ABNORMAL LOW (ref 80.0–100.0)
Monocytes Absolute: 0.3 10*3/uL (ref 0.1–1.0)
Monocytes Relative: 5 %
Neutro Abs: 5.6 10*3/uL (ref 1.7–7.7)
Neutrophils Relative %: 83 %
Platelets: 252 10*3/uL (ref 150–400)
RBC: 5.87 MIL/uL — ABNORMAL HIGH (ref 3.87–5.11)
RDW: 16.2 % — ABNORMAL HIGH (ref 11.5–15.5)
WBC: 6.8 10*3/uL (ref 4.0–10.5)
nRBC: 0 % (ref 0.0–0.2)

## 2020-06-11 LAB — COMPREHENSIVE METABOLIC PANEL
ALT: 21 U/L (ref 0–44)
AST: 26 U/L (ref 15–41)
Albumin: 3.1 g/dL — ABNORMAL LOW (ref 3.5–5.0)
Alkaline Phosphatase: 82 U/L (ref 38–126)
Anion gap: 11 (ref 5–15)
BUN: 27 mg/dL — ABNORMAL HIGH (ref 6–20)
CO2: 31 mmol/L (ref 22–32)
Calcium: 8.2 mg/dL — ABNORMAL LOW (ref 8.9–10.3)
Chloride: 93 mmol/L — ABNORMAL LOW (ref 98–111)
Creatinine, Ser: 0.83 mg/dL (ref 0.44–1.00)
GFR, Estimated: >60 mL/min (ref >60)
Glucose, Bld: 247 mg/dL — ABNORMAL HIGH (ref 70–99)
Potassium: 3.6 mmol/L (ref 3.5–5.1)
Sodium: 135 mmol/L (ref 135–145)
Total Bilirubin: 0.5 mg/dL (ref 0.3–1.2)
Total Protein: 7.6 g/dL (ref 6.5–8.1)

## 2020-06-11 LAB — PHOSPHORUS: Phosphorus: 2.9 mg/dL (ref 2.5–4.6)

## 2020-06-11 LAB — D-DIMER, QUANTITATIVE: D-Dimer, Quant: 1.25 ug/mL-FEU — ABNORMAL HIGH (ref 0.00–0.50)

## 2020-06-11 LAB — GLUCOSE, CAPILLARY
Glucose-Capillary: 248 mg/dL — ABNORMAL HIGH (ref 70–99)
Glucose-Capillary: 227 mg/dL — ABNORMAL HIGH (ref 70–99)
Glucose-Capillary: 306 mg/dL — ABNORMAL HIGH (ref 70–99)

## 2020-06-11 LAB — FERRITIN: Ferritin: 57 ng/mL (ref 11–307)

## 2020-06-11 LAB — MAGNESIUM: Magnesium: 1.9 mg/dL (ref 1.7–2.4)

## 2020-06-11 LAB — C-REACTIVE PROTEIN: CRP: 1.7 mg/dL — ABNORMAL HIGH (ref <1.0)

## 2020-06-11 MED ORDER — PREDNISONE 10 MG PO TABS: 40.0000 mg | ORAL_TABLET | Freq: Every day | ORAL | 0 refills | Status: AC

## 2020-06-11 MED ORDER — ZINC SULFATE 220 (50 ZN) MG PO CAPS: 220.0000 mg | ORAL_CAPSULE | Freq: Every day | ORAL | 0 refills | Status: AC

## 2020-06-11 MED ORDER — INSULIN ASPART 100 UNIT/ML ~~LOC~~ SOLN
25.0000 [IU] | Freq: Three times a day (TID) | SUBCUTANEOUS | Status: DC
  Administered 2020-06-11 (×2): 25 [IU] via SUBCUTANEOUS

## 2020-06-11 MED ORDER — ASCORBIC ACID 500 MG PO TABS: 500.0000 mg | ORAL_TABLET | Freq: Every day | ORAL | 0 refills | Status: AC

## 2020-06-11 MED ORDER — INSULIN GLARGINE 100 UNIT/ML ~~LOC~~ SOLN
65.0000 [IU] | Freq: Every day | SUBCUTANEOUS | Status: DC
  Filled 2020-06-11: qty 0.65

## 2020-06-11 NOTE — Telephone Encounter (Signed)
Patient has not been seen by Pulmonary and I don't see any orders for HST

## 2020-06-11 NOTE — TOC Transition Note (Signed)
Transition of Care [facility]) - CM/SW Discharge Note   Patient Details  Name: Eddy Termine MRN: [id number] Date of Birth: 1975/08/10  [facility] CM/SW Contact:  Ihor Gully, LCSW Phone Number: 06/11/2020, 3:52 PM   Clinical Narrative:    Patient provided dial scale for weight check.    Final next level of care: Home/Self Care Barriers to Discharge: Continued Medical Work up   Patient Goals and CMS Choice Patient states their goals for this hospitalization and ongoing recovery are:: Return home   Choice offered to / list presented to : NA  Discharge Placement                       Discharge Plan and Services In-house Referral: NA Discharge Planning Services: NA Post Acute Care Choice: Durable Medical Equipment                    HH Arranged: NA [place] Agency: NA        Social Determinants of Health (SDOH) Interventions     Readmission Risk Interventions Readmission Risk Prevention Plan 06/10/2020 03/28/2020  Transportation Screening Complete Complete  HRI or [place] - Complete  Social Work Consult for [place] Planning/Counseling - Complete  Palliative Care Screening - Not Applicable  Medication Review Press photographer) Complete Complete  HRI or Home Care Consult Complete -  SW Recovery Care/Counseling Consult Complete -  Palliative Care Screening Not Applicable -  [place] Not Applicable -  Some recent data might be hidden

## 2020-06-11 NOTE — Progress Notes (Signed)
Pt discharged via WC to POV with all personal belongings in her possession. Weight scale provided by CM given to patient at time of discharge, advised pt on obtaining weight every am, documenting, and when to call MD for fluid retention. Pt states understanding.

## 2020-06-11 NOTE — Discharge Summary (Signed)
Physician Discharge Summary  Chelsey Santiago [id number] DOB: 08/03/1975 DOA: 06/09/2020  PCP: Rosita Fire, MD  Admit date: 06/09/2020  Discharge date: 06/11/2020  Admitted From:Home  Disposition:  Home  Recommendations for Outpatient Follow-up:  1. Follow up with PCP in 1-2 weeks 2. Continue on home torsemide and monitor weight closely 3. Continue prednisone as prescribed 4. Continue previous home medications  Home Health: None  Equipment/Devices: Bedside commode  Discharge Condition:Stable  CODE STATUS: Full  Diet recommendation: Heart Healthy/carb modified  Brief/Interim Summary: 45 y.o.femalewith medical history significant forCHF, diabetes mellitus, OSA on CPAP, hypertension.  Patient presented to the ED with complaints of difficulty breathing and productive cough. She reports symptoms started about 2 to 3 days ago. She reports lower centralrib/sternal pain from coughing. Patient is on 2 L home O2 that she uses as needed,she last used it about a week ago atnight.With onset of difficulty breathing she had to use her O2 yesterday night.  She reports bilateral lower extremity swelling for the past week. She reports someweight loss over the past2 weeks duepoor appetite,but she does not have a scale so she does not check her weight. She reports compliance with her medications which include torsemide.    -Patient was admitted with acute on chronic hypoxemic respiratory failure secondary to Covid pneumonia as well as CHF exacerbation with noncompliance with her medication regimen.  She has now been adequately diuresed with no further edema or hypoxemia with ambulation noted.  Her discharge weight is noted to be 218 pounds which is less than her usual 225 pounds, she claims.  She is a high risk for readmission-she states that she will not obtain a scale and does not have the money for it.  I have encouraged her to remain compliant with her medications and obtain a scale  and watch her diet.  She is in stable condition for discharge with no further acute events noted throughout the course of this admission.  Discharge Diagnoses:  Principal Problem:   Acute on chronic respiratory failure with hypoxia (HCC) Active Problems:   Morbid obesity (HCC)   OSA on CPAP   Hypertensive cardiovascular disease   Acute on chronic combined systolic and diastolic CHF, NYHA class 4 ([place])   Pneumonia due to COVID-19 virus  Principal discharge diagnosis: Acute on chronic hypoxemic respiratory failure-multifactorial due to Covid pneumonia as well as acute on chronic combined heart failure exacerbation.  Discharge Instructions  Discharge Instructions    DME Bedside commode   Complete by: As directed    Patient needs a bedside commode to treat with the following condition: Weakness   Diet - low sodium heart healthy   Complete by: As directed    Increase activity slowly   Complete by: As directed      Allergies as of 06/11/2020      Reactions   Diclofenac Swelling   AND POSSIBLE SYNCOPE; tolerates ibuprofen per pt   Tramadol Nausea And Vomiting, Nausea Only   Itching (12/21); tolerates ibuprofen per pt   Vicodin [hydrocodone-acetaminophen] Itching, Nausea Only      Medication List    STOP taking these medications   amoxicillin-clavulanate 875-125 MG tablet Commonly known as: AUGMENTIN     TAKE these medications   albuterol 108 (90 Base) MCG/ACT inhaler Commonly known as: VENTOLIN HFA Inhale 1 puff into the lungs every 6 (six) hours as needed for wheezing or shortness of breath.   amLODipine 10 MG tablet Commonly known as: NORVASC Take 1 tablet (10 mg total)  by mouth daily.   ARIPiprazole 10 MG tablet Commonly known as: ABILIFY Take 1 tablet (10 mg total) by mouth daily.   ascorbic acid 500 MG tablet Commonly known as: VITAMIN C Take 1 tablet (500 mg total) by mouth daily for 15 days. Start taking on: June 12, 2020   aspirin EC 81 MG tablet Take  81 mg by mouth daily.   budesonide-formoterol 80-4.5 MCG/ACT inhaler Commonly known as: Symbicort Inhale 2 puffs into the lungs 2 (two) times daily.   carvedilol 25 MG tablet Commonly known as: COREG Take 2 tablets (50 mg total) by mouth 2 (two) times daily with a meal.   clonazePAM 0.5 MG tablet Commonly known as: KLONOPIN Take 0.5 mg by mouth 2 (two) times daily.   dextromethorphan-guaiFENesin 30-600 MG 12hr tablet Commonly known as: MUCINEX DM Take 1 tablet by mouth 2 (two) times daily.   Entresto 97-103 MG Generic drug: sacubitril-valsartan Take 1 tablet by mouth 2 (two) times daily.   gabapentin 600 MG tablet Commonly known as: NEURONTIN Take 600 mg by mouth 3 (three) times daily.   hydrALAZINE 100 MG tablet Commonly known as: APRESOLINE Take 1 tablet (100 mg total) by mouth 3 (three) times daily.   insulin glargine 100 UNIT/ML injection Commonly known as: LANTUS Inject 30 Units at bedtime into the skin.   insulin lispro 100 UNIT/ML injection Commonly known as: HUMALOG Inject 15 Units into the skin 3 (three) times daily before meals.   Ipratropium-Albuterol 20-100 MCG/ACT Aers respimat Commonly known as: Combivent Respimat Inhale 1 puff into the lungs every 6 (six) hours as needed for wheezing or shortness of breath.   linaclotide 145 MCG Caps capsule Commonly known as: Linzess Take 1 capsule (145 mcg total) by mouth daily before breakfast.   nitroGLYCERIN 0.4 MG SL tablet Commonly known as: Nitrostat Place 1 tablet (0.4 mg total) under the tongue every 5 (five) minutes as needed for chest pain.   ondansetron 8 MG disintegrating tablet Commonly known as: Zofran ODT Take 1 tablet (8 mg total) by mouth every 8 (eight) hours as needed for nausea or vomiting.   potassium chloride 10 MEQ tablet Commonly known as: KLOR-CON Take 1 tablet (10 mEq total) by mouth daily for 14 days.   potassium chloride SA 20 MEQ tablet Commonly known as: Klor-Con M20 Take 2  tablets (40 mEq total) by mouth daily.   potassium chloride SA 20 MEQ tablet Commonly known as: KLOR-CON Take 1 tablet (20 mEq total) by mouth 2 (two) times daily.   predniSONE 10 MG tablet Commonly known as: DELTASONE Take 4 tablets (40 mg total) by mouth daily for 7 days. What changed:   how much to take  how to take this  when to take this  additional instructions   promethazine-codeine 6.25-10 MG/5ML syrup Commonly known as: PHENERGAN with CODEINE Take 5 mLs by mouth every 4 (four) hours as needed for cough.   pyridostigmine 60 MG tablet Commonly known as: MESTINON Take 60 mg by mouth every 8 (eight) hours.   torsemide 20 MG tablet Commonly known as: DEMADEX Take 4 tablets (80 mg total) by mouth daily.   traZODone 100 MG tablet Commonly known as: DESYREL Take 2 tablets (200 mg total) by mouth at bedtime.   triamcinolone ointment 0.5 % Commonly known as: KENALOG Apply 1 application topically 2 (two) times daily.   zinc sulfate 220 (50 Zn) MG capsule Take 1 capsule (220 mg total) by mouth daily for 15 days. Start taking on:  June 12, 2020            Durable Medical Equipment  (From admission, onward)         Start     Ordered   06/11/20 0000  DME Bedside commode       Question:  Patient needs a bedside commode to treat with the following condition  Answer:  Weakness   06/11/20 1452          Follow-up Information    Rosita Fire, MD.   Specialty: Internal Medicine Contact information: [place] [address] [phone]              Allergies  Allergen Reactions  . Diclofenac Swelling    AND POSSIBLE SYNCOPE; tolerates ibuprofen per pt  . Tramadol Nausea And Vomiting and Nausea Only    Itching (12/21); tolerates ibuprofen per pt  . Vicodin [Hydrocodone-Acetaminophen] Itching and Nausea Only    Consultations:  None   Procedures/Studies: CT ANGIO CHEST PE W OR WO CONTRAST  Result Date:  06/09/2020 CLINICAL DATA:  COVID, shortness of breath EXAM: CT ANGIOGRAPHY CHEST WITH CONTRAST TECHNIQUE: Multidetector CT imaging of the chest was performed using the standard protocol during bolus administration of intravenous contrast. Multiplanar CT image reconstructions and MIPs were obtained to evaluate the vascular anatomy. CONTRAST:  155mL OMNIPAQUE IOHEXOL 350 MG/ML SOLN COMPARISON:  07/04/2019 FINDINGS: Cardiovascular: No filling defects in the pulmonary arteries to suggest pulmonary emboli. Cardiomegaly. Aorta normal caliber. Mediastinum/Nodes: No mediastinal, hilar, or axillary adenopathy. Trachea and esophagus are unremarkable. Thyroid unremarkable. Lungs/Pleura: Ground-glass opacities within the lungs, favor early edema although infection cannot be completely excluded. No effusions. Upper Abdomen: Imaging into the upper abdomen demonstrates no acute findings. Musculoskeletal: Chest wall soft tissues are unremarkable. No acute bony abnormality. Review of the MIP images confirms the above findings. IMPRESSION: No evidence of pulmonary embolus. Cardiomegaly. Bilateral patchy ground-glass opacities without a peripheral predilection. Favor edema although infection cannot be excluded. Electronically Signed   By: Rolm Baptise M.D.   On: 06/09/2020 19:54   DG Chest Port 1 View  Result Date: 06/09/2020 CLINICAL DATA:  Cough, COVID positive EXAM: PORTABLE CHEST 1 VIEW COMPARISON:  05/01/2020 FINDINGS: Stable cardiomediastinal contours with mild cardiomegaly. Both lungs are clear. No pleural effusion or pneumothorax. The visualized skeletal structures are unremarkable. IMPRESSION: No acute process in the chest.  Stable mild cardiomegaly. Electronically Signed   By: Macy Mis M.D.   On: 06/09/2020 14:31      Discharge Exam: Vitals:   06/11/20 0833 06/11/20 1146  BP: 133/87 126/84  Pulse: 69   Resp: 16   Temp: 97.7 F (36.5 C)   SpO2: 95%    Vitals:   06/11/20 0147 06/11/20 0534 06/11/20  0833 06/11/20 1146  BP: (!) 135/93 130/84 133/87 126/84  Pulse: 73 73 69   Resp: 20 16 16    Temp: 97.7 F (36.5 C) 98.7 F (37.1 C) 97.7 F (36.5 C)   TempSrc: Oral  Oral   SpO2: 95% 97% 95%   Weight: 98.9 kg     Height: 5\' 7"  (1.702 m)       General: Pt is alert, awake, not in acute distress, obese Cardiovascular: RRR, S1/S2 +, no rubs, no gallops Respiratory: CTA bilaterally, no wheezing, no rhonchi Abdominal: Soft, NT, ND, bowel sounds + Extremities: no edema, no cyanosis    The results of significant diagnostics from this hospitalization (including imaging, microbiology, ancillary and laboratory) are listed below for reference.  Microbiology: No results found for this or any previous visit (from the past 240 hour(s)).   Labs: BNP (last 3 results) Recent Labs    03/27/20 1940 05/01/20 1117 06/09/20 1511  BNP 444.0* 820.0* [id number]*   Basic Metabolic Panel: Recent Labs  Lab 06/09/20 1511 06/09/20 2039 06/10/20 0650 06/11/20 0535  NA 133* 132* 135 135  K 2.9* 3.0* 3.8 3.6  CL 93* 91* 94* 93*  CO2 27 29 29 31   GLUCOSE 245* 199* 398* 247*  BUN 18 17 20  27*  CREATININE 1.06* 0.90 0.90 0.83  CALCIUM 8.7* 8.0* 8.2* 8.2*  MG  --  1.3* 1.4* 1.9  PHOS  --   --  3.6 2.9   Liver Function Tests: Recent Labs  Lab 06/09/20 2039 06/10/20 0650 06/11/20 0535  AST 22 28 26   ALT 18 22 21   ALKPHOS 75 79 82  BILITOT 0.7 0.6 0.5  PROT 6.7 7.3 7.6  ALBUMIN 2.8* 3.0* 3.1*   No results for input(s): LIPASE, AMYLASE in the last 168 hours. No results for input(s): AMMONIA in the last 168 hours. CBC: Recent Labs  Lab 06/09/20 1511 06/10/20 0650 06/11/20 0535  WBC 5.9 4.4 6.8  NEUTROABS 4.8 3.5 5.6  HGB 10.9* 10.9* 13.2  HCT 35.7* 35.8* 44.2  MCV 74.7* 75.1* 75.3*  PLT 261 225 252   Cardiac Enzymes: No results for input(s): CKTOTAL, CKMB, CKMBINDEX, TROPONINI in the last 168 hours. BNP: Invalid input(s): POCBNP CBG: Recent Labs  Lab 06/10/20 1656  06/10/20 2244 06/11/20 0534 06/11/20 0725 06/11/20 1100  GLUCAP 181* 214* 248* 227* 306*   D-Dimer Recent Labs    06/10/20 0650 06/11/20 0535  DDIMER 1.28* 1.25*   Hgb A1c Recent Labs    06/10/20 0650  HGBA1C 10.7*   Lipid Profile No results for input(s): CHOL, HDL, LDLCALC, TRIG, CHOLHDL, LDLDIRECT in the last 72 hours. Thyroid function studies No results for input(s): TSH, T4TOTAL, T3FREE, THYROIDAB in the last 72 hours.  Invalid input(s): FREET3 Anemia work up Recent Labs    06/10/20 0650 06/11/20 0535  FERRITIN 52 57   Urinalysis    Component Value Date/Time   COLORURINE YELLOW 08/28/2019 1226   APPEARANCEUR CLEAR 08/28/2019 1226   LABSPEC 1.028 08/28/2019 1226   PHURINE 6.0 08/28/2019 1226   GLUCOSEU >=500 (A) 08/28/2019 1226   HGBUR NEGATIVE 08/28/2019 1226   BILIRUBINUR NEGATIVE 08/28/2019 1226   KETONESUR NEGATIVE 08/28/2019 1226   PROTEINUR Negative 12/19/2019 1010   PROTEINUR NEGATIVE 08/28/2019 1226   UROBILINOGEN 0.2 04/29/2014 1115   NITRITE n 12/19/2019 1010   NITRITE NEGATIVE 08/28/2019 1226   LEUKOCYTESUR Negative 12/19/2019 1010   LEUKOCYTESUR TRACE (A) 08/28/2019 1226   Sepsis Labs Invalid input(s): PROCALCITONIN,  WBC,  LACTICIDVEN Microbiology No results found for this or any previous visit (from the past 240 hour(s)).   Time coordinating discharge: 35 minutes  SIGNED:   Rodena Goldmann, DO [facility] 06/11/2020, 2:53 PM  If 7PM-7AM, please contact night-coverage [email or web address]

## 2020-06-11 NOTE — Telephone Encounter (Signed)
Pt is currently admitted in the hospital as she has been there since 06/09/20 at [facility]. It does look like pt is to be discharged today 06/11/20.  Pt has never been seen at [facility].  Called and spoke with pt. Pt was scheduled for a sleep consult with RA 1/17 but it had to be cancelled due to the weather. Pt's appt has been rescheduled with RA in February. Nothing further needed.

## 2020-06-11 NOTE — Progress Notes (Signed)
Pt ambulated total of 80 ft in her room with standby assist (while talking on telephone).  SaO2 at rest: 97% RA SaO2 while ambulating: 98% on RA  Pt states feels tired and "a little winded" but resp unlabored and no accessory muscle use noted.

## 2020-06-11 NOTE — Progress Notes (Signed)
Inpatient Diabetes Program Recommendations  AACE/ADA: New Consensus Statement on Inpatient Glycemic Control (2015)  Target Ranges:  Prepandial:   less than 140 mg/dL      Peak postprandial:   less than 180 mg/dL (1-2 hours)      Critically ill patients:  140 - 180 mg/dL   Lab Results  Component Value Date   GLUCAP 306 (H) 06/11/2020   HGBA1C 10.7 (H) 06/10/2020    Review of Glycemic Control Results for Chelsey Santiago, Chelsey Santiago (MRN [id number]) as of 06/11/2020 15:00  Ref. Range 06/11/2020 05:34 06/11/2020 07:25 06/11/2020 11:00  Glucose-Capillary Latest Ref Range: 70 - 99 mg/dL 248 (H) 227 (H) 306 (H)   Spoke with patient regarding outpatient diabetes management.  Reviewed patient's current A1c of 10.7%. Explained what a A1c is and what it measures. Also reviewed goal A1c with patient, importance of good glucose control @ home, and blood sugar goals. Reviewed patho DM, role of pancreas, impact of covid and steroids to glucose trends, vascular changes and commorbidities.  Patient admits to drinking sugary beverages. Reviewed alternatives, plate method, and importance of being mindful of CHO intake. Encouraged patein to follow up with PCP, especially with being discharged on Prednisone. Patient has no further questions at this time.   Thanks, Bronson Curb, MSN, RNC-OB Diabetes Coordinator [phone] (8a-5p)

## 2020-06-19 ENCOUNTER — Encounter (HOSPITAL_COMMUNITY): Payer: Self-pay | Admitting: *Deleted

## 2020-06-19 ENCOUNTER — Emergency Department (HOSPITAL_COMMUNITY): Payer: Medicaid Other

## 2020-06-19 ENCOUNTER — Other Ambulatory Visit: Payer: Self-pay

## 2020-06-19 ENCOUNTER — Emergency Department (HOSPITAL_COMMUNITY)
Admission: EM | Admit: 2020-06-19 | Discharge: 2020-06-19 | Disposition: A | Discharge status: Home / Self Care | Payer: Medicaid Other | Attending: Emergency Medicine | Admitting: Emergency Medicine

## 2020-06-19 DIAGNOSIS — J1282 Pneumonia due to coronavirus disease 2019: Secondary | ICD-10-CM | POA: Diagnosis not present

## 2020-06-19 DIAGNOSIS — Z7982 Long term (current) use of aspirin: Secondary | ICD-10-CM | POA: Insufficient documentation

## 2020-06-19 DIAGNOSIS — E119 Type 2 diabetes mellitus without complications: Secondary | ICD-10-CM | POA: Insufficient documentation

## 2020-06-19 DIAGNOSIS — I110 Hypertensive heart disease with heart failure: Secondary | ICD-10-CM | POA: Diagnosis not present

## 2020-06-19 DIAGNOSIS — U071 COVID-19: Secondary | ICD-10-CM | POA: Insufficient documentation

## 2020-06-19 DIAGNOSIS — Z794 Long term (current) use of insulin: Secondary | ICD-10-CM | POA: Diagnosis not present

## 2020-06-19 DIAGNOSIS — Z79899 Other long term (current) drug therapy: Secondary | ICD-10-CM | POA: Diagnosis not present

## 2020-06-19 DIAGNOSIS — I5042 Chronic combined systolic (congestive) and diastolic (congestive) heart failure: Secondary | ICD-10-CM | POA: Insufficient documentation

## 2020-06-19 DIAGNOSIS — R059 Cough, unspecified: Secondary | ICD-10-CM | POA: Diagnosis present

## 2020-06-19 MED ORDER — PROMETHAZINE-CODEINE 6.25-10 MG/5ML PO SYRP: 5.0000 mL | ORAL_SOLUTION | Freq: Four times a day (QID) | ORAL | 0 refills | Status: DC | PRN

## 2020-06-19 MED ORDER — MAGIC MOUTHWASH W/LIDOCAINE: 5.0000 mL | Freq: Three times a day (TID) | ORAL | 0 refills | Status: DC | PRN

## 2020-06-19 NOTE — Discharge Instructions (Signed)
Your chest x-ray today shows you have a pneumonia that is likely a result of your Covid infection.  I have provided referral information to the Covid clinic for you.  Someone from the clinic should contact you tomorrow.  You have been written prescriptions to help with your symptoms.  You may take Tylenol if needed for body aches or fever.  Return to the emergency department if you develop any worsening symptoms.

## 2020-06-19 NOTE — ED Notes (Signed)
Pt sitting upright in bed, VS updated, pt c/o painful mouth and cough. Drink provided, tolerating without complication, to be D/C'd.

## 2020-06-19 NOTE — ED Triage Notes (Signed)
Diagnosed with covid last week, c/o cough with rawness of tongue

## 2020-06-19 NOTE — ED Provider Notes (Signed)
[facility] Provider Note   CSN: [id number] Arrival date & time: 06/19/20  1548     History No chief complaint on file.   Chelsey Santiago is a 45 y.o. female.  HPI      Chelsey Santiago is a 45 y.o. female with past medical history significant for hypertension, type 2 diabetes, anemia presents to the Emergency Department complaining of cough, headache, and irritation of her throat, gums and tongue.  Patient was seen here on 06/09/2020 found to be Covid positive.  Unvaccinated.  Since that time she reports persistent cough and intermittent nasal congestion.  States that she occasionally coughs up "red to pink looking" mucus.  She also states that when she brushes her teeth she has noticed some slight bleeding of her gums.  Does not take blood thinners.  No epistaxis.  Throat pain associated with swallowing.  She denies fever, chest pain, abdominal pain, vomiting or diarrhea.  She was prescribed steroids shortly after her diagnosis, but states that she discontinued them after 2 days stating that her blood sugar became elevated.    Past Medical History:  Diagnosis Date  . Anemia    H&H of 10.6/33 and 07/2008 and 11.9/35 and 09/2010  . Anxiety   . Chronic combined systolic and diastolic CHF (congestive heart failure) ([place])   . Depression with anxiety   . Diabetes mellitus without complication ([place])   . Hypertension   . Hypertensive heart disease 2009   Pulmonary edema postpartum; mild to moderate mitral regurgitation when hospitalized for CHF in 2009; Echocardiogram in 12/2009-no MR and normal EF; normal CXR in 09/2010  . Migraine headache   . Miscarriage 03/19/2013  . Obesity 04/16/2009  . Osteoarthritis, knee 03/29/2011  . Preeclampsia   . Pulmonary edema   . Sleep apnea   . Threatened abortion in early pregnancy 03/15/2013    Patient Active Problem List   Diagnosis Date Noted  . Pneumonia due to COVID-19 virus 06/09/2020  . Acute on chronic respiratory  failure with hypoxia ([place]) 06/09/2020  . Obstructive sleep apnea 03/29/2020  . Flash pulmonary edema ([place]) 03/27/2020  . Back pain 12/19/2019  . Vaginal discharge 12/19/2019  . Vaginal itching 12/19/2019  . BV (bacterial vaginosis) 12/19/2019  . Chronic hypertension 12/19/2019  . Fibroids 09/26/2019  . S/P hysterectomy 09/26/2019  . Acute blood loss anemia 09/26/2019  . Abdominal pain, epigastric 03/14/2019  . Dysphagia 03/14/2019  . Gastroesophageal reflux disease 03/14/2019  . Class 2 obesity   . Acute exacerbation of CHF (congestive heart failure) ([place]) 10/26/2018  . Chronic combined systolic and diastolic CHF (congestive heart failure) ([place]) 06/02/2018  . Uncontrolled type 2 diabetes mellitus with hyperglycemia ([place]) 06/02/2018  . Influenza B 05/13/2018  . Hypomagnesemia 05/12/2018  . Headache 05/12/2018  . Upper respiratory tract infection   . HCAP (healthcare-associated pneumonia)   . Constipation 10/17/2017  . Bad headache   . CHF exacerbation ([place]) 09/29/2017  . Iron deficiency anemia 05/16/2017  . Vitamin D deficiency 11/25/2016  . Iron deficiency 11/25/2016  . History of acute myocardial infarction 10/05/2016  . CHF (congestive heart failure) ([place]) 05/06/2016  . Diabetes mellitus with complication ([place]) 66/44/0347  . Leukocytosis 05/06/2016  . Neuropathy 05/06/2016  . Depression 04/16/2016  . Chronic tension-type headache, not intractable 04/16/2016  . AKI (acute kidney injury) ([place])   . Hyperkalemia   . Nonischemic cardiomyopathy ([place])   . Acute on chronic combined systolic and diastolic ACC/AHA stage C congestive heart failure ([place]) 04/03/2016  .  Acute on chronic combined systolic and diastolic CHF, NYHA class 4 ([place]) 04/03/2016  . Hypertensive emergency 04/03/2016  . Cardiomyopathy due to hypertension ([place]) 12/22/2015  . Normal coronary arteries 12/22/2015  . Troponin level elevated 12/22/2015  . NSTEMI (non-ST elevated myocardial infarction) ([place]) 12/20/2015   . Dental infection 10/10/2015  . Chest pain 09/05/2015  . Systolic CHF, chronic ([place]) 09/05/2015  . LLQ pain   . Type 2 diabetes mellitus without complication ([place]) 123XX123  . Essential hypertension   . 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 Hypertensive urgency 04/22/2014  . Acute CHF ([place]) 04/22/2014  . S/P cesarean section 04/11/2014  . Acute pulmonary edema ([place]) 04/11/2014  . Postoperative anemia 04/11/2014  . Elevated serum creatinine 04/11/2014  . Preeclampsia, severe 04/09/2014  . Pre-eclampsia superimposed on chronic hypertension, antepartum 04/08/2014  . Dyspnea 04/08/2014  . Polyhydramnios in third trimester, antepartum 03/14/2014  . Abnormal maternal glucose tolerance, antepartum 03/11/2014  . High-risk pregnancy 03/11/2014  . Pre-existing essential hypertension complicating pregnancy 0000000  . Impaired glucose tolerance during pregnancy, antepartum 11/27/2013  . Leiomyoma of uterus 11/22/2013  . History of gestational diabetes in prior pregnancy, currently pregnant in first trimester 11/22/2013  . Hx of preeclampsia, prior pregnancy, currently pregnant 11/22/2013  . Short interval between pregnancies affecting pregnancy, antepartum 11/22/2013  . Supervision of high-risk pregnancy of elderly primigravida (>= 15 years old at delivery), third trimester 11/22/2013  . Miscarriage 03/19/2013  . Major depressive disorder, single episode, unspecified 09/27/2011  . Hypertension   . Hypertensive cardiovascular disease   . Microcytic anemia   . Osteoarthrosis involving lower leg 03/29/2011  . Hypokalemia 12/12/2009  . OSA on CPAP 12/09/2009  . Morbid obesity ([place]) 04/16/2009    Past Surgical History:  Procedure Laterality Date  . BREAST REDUCTION SURGERY  2002  . CARDIAC CATHETERIZATION N/A 12/22/2015   Procedure: Left Heart Cath and Coronary Angiography;  Surgeon: Peter M Martinique, MD;  Location: [facility];  Service: Cardiovascular;  Laterality: N/A;  .  CESAREAN SECTION N/A 04/09/2014   Procedure: CESAREAN SECTION;  Surgeon: Mora Bellman, MD;  Location: [facility];  Service: Obstetrics;  Laterality: N/A;  . CHOLECYSTECTOMY    . HYSTERECTOMY ABDOMINAL WITH SALPINGECTOMY N/A 09/26/2019   Procedure: HYSTERECTOMY ABDOMINAL WITH SALPINGECTOMY;  Surgeon: Florian Buff, MD;  Location: [facility];  Service: Gynecology;  Laterality: N/A;  . LIPOMA EXCISION Right 09/26/2019   Procedure: EXCISION LIPOMA RIGHT VULVAR;  Surgeon: Florian Buff, MD;  Location: [facility];  Service: Gynecology;  Laterality: Right;     OB History    Gravida  11   Para  6   Term  5   Preterm  1   AB  5   Living  6     SAB  3   IAB  2   Ectopic      Multiple  0   Live Births  6           Family History  Problem Relation Age of Onset  . Diabetes Mother 52  . Heart disease Mother   . Hyperlipidemia Paternal Grandfather   . Hypertension Paternal Grandfather   . Heart disease Father   . Hypertension Father   . Heart disease Maternal Grandmother 60  . ADD / ADHD Son   . Hypertension Maternal Uncle   . Heart attack Brother   . Sudden death Neg Hx   . Colon cancer Neg Hx   . Celiac disease Neg Hx   .  Inflammatory bowel disease Neg Hx     Social History   Tobacco Use  . Smoking status: Never Smoker  . Smokeless tobacco: Never Used  Vaping Use  . Vaping Use: Never used  Substance Use Topics  . Alcohol use: Yes    Comment: occ  . Drug use: No    Home Medications Prior to Admission medications   Medication Sig Start Date End Date Taking? Authorizing Provider  albuterol (VENTOLIN HFA) 108 (90 Base) MCG/ACT inhaler Inhale 1 puff into the lungs every 6 (six) hours as needed for wheezing or shortness of breath. 03/30/20   Kathie Dike, MD  amLODipine (NORVASC) 10 MG tablet Take 1 tablet (10 mg total) by mouth daily. 09/04/19 03/28/20  Strader, Fransisco Hertz, PA-C  ARIPiprazole (ABILIFY) 10 MG tablet Take 1 tablet (10 mg total) by mouth daily. 10/30/18    Barton Dubois, MD  ascorbic acid (VITAMIN C) 500 MG tablet Take 1 tablet (500 mg total) by mouth daily for 15 days. 06/12/20 06/27/20  Manuella Ghazi, Pratik D, DO  aspirin EC 81 MG tablet Take 81 mg by mouth daily.    [provider]  budesonide-formoterol (SYMBICORT) 80-4.5 MCG/ACT inhaler Inhale 2 puffs into the lungs 2 (two) times daily. 03/30/20   Kathie Dike, MD  carvedilol (COREG) 25 MG tablet Take 2 tablets (50 mg total) by mouth 2 (two) times daily with a meal. 01/23/20   Branch, Alphonse Guild, MD  clonazePAM (KLONOPIN) 0.5 MG tablet Take 0.5 mg by mouth 2 (two) times daily. 12/19/18   [provider]  dextromethorphan-guaiFENesin (MUCINEX DM) 30-600 MG 12hr tablet Take 1 tablet by mouth 2 (two) times daily. 03/30/20   Kathie Dike, MD  gabapentin (NEURONTIN) 600 MG tablet Take 600 mg by mouth 3 (three) times daily.     [provider]  hydrALAZINE (APRESOLINE) 100 MG tablet Take 1 tablet (100 mg total) by mouth 3 (three) times daily. 01/23/20   Arnoldo Lenis, MD  insulin glargine (LANTUS) 100 UNIT/ML injection Inject 30 Units at bedtime into the skin.     [provider]  insulin lispro (HUMALOG) 100 UNIT/ML injection Inject 15 Units into the skin 3 (three) times daily before meals.     [provider]  Ipratropium-Albuterol (COMBIVENT RESPIMAT) 20-100 MCG/ACT AERS respimat Inhale 1 puff into the lungs every 6 (six) hours as needed for wheezing or shortness of breath. 05/04/18   Manuella Ghazi, Pratik D, DO  linaclotide Rolan Lipa) 145 MCG CAPS capsule Take 1 capsule (145 mcg total) by mouth daily before breakfast. 03/14/19   Erenest Rasher, PA-C  nitroGLYCERIN (NITROSTAT) 0.4 MG SL tablet Place 1 tablet (0.4 mg total) under the tongue every 5 (five) minutes as needed for chest pain. 09/05/15   Kathie Dike, MD  ondansetron (ZOFRAN ODT) 8 MG disintegrating tablet Take 1 tablet (8 mg total) by mouth every 8 (eight) hours as needed for nausea or vomiting. 09/06/19    Long, Wonda Olds, MD  potassium chloride SA (KLOR-CON M20) 20 MEQ tablet Take 2 tablets (40 mEq total) by mouth daily. 09/04/19 03/28/20  Strader, Fransisco Hertz, PA-C  promethazine-codeine (PHENERGAN WITH CODEINE) 6.25-10 MG/5ML syrup Take 5 mLs by mouth every 4 (four) hours as needed for cough. 03/30/20   Kathie Dike, MD  pyridostigmine (MESTINON) 60 MG tablet Take 60 mg by mouth every 8 (eight) hours.    [provider]  sacubitril-valsartan (ENTRESTO) 97-103 MG Take 1 tablet by mouth 2 (two) times daily. 03/16/19   Branch,  Dorothe Pea, MD  torsemide (DEMADEX) 20 MG tablet Take 4 tablets (80 mg total) by mouth daily. 03/30/20 06/28/20  Erick Blinks, MD  traZODone (DESYREL) 100 MG tablet Take 2 tablets (200 mg total) by mouth at bedtime. 10/29/18   Vassie Loll, MD  triamcinolone ointment (KENALOG) 0.5 % Apply 1 application topically 2 (two) times daily. 12/19/19   Adline Potter, NP  zinc sulfate 220 (50 Zn) MG capsule Take 1 capsule (220 mg total) by mouth daily for 15 days. 06/12/20 06/27/20  Sherryll Burger, Pratik D, DO    Allergies    Diclofenac, Tramadol, and Vicodin [hydrocodone-acetaminophen]  Review of Systems   Review of Systems  Constitutional: Negative for appetite change, chills and fever.  HENT: Positive for congestion, mouth sores, rhinorrhea and sore throat. Negative for trouble swallowing.   Respiratory: Positive for cough. Negative for chest tightness, shortness of breath and wheezing.   Cardiovascular: Negative for chest pain and leg swelling.  Gastrointestinal: Negative for abdominal pain, diarrhea, nausea and vomiting.  Genitourinary: Negative for dysuria.  Musculoskeletal: Negative for arthralgias, myalgias and neck pain.  Skin: Negative for rash.  Neurological: Negative for dizziness, weakness and numbness.  Hematological: Negative for adenopathy.  Psychiatric/Behavioral: Negative for confusion.    Physical Exam Updated Vital Signs BP (!) 165/102 (BP Location:  Right Arm)   Pulse 93   Temp 98.1 F (36.7 C) (Oral)   Resp 20   Ht 5\' 6"  (1.676 m)   Wt 98.9 kg   LMP 08/21/2019   SpO2 98%   BMI 35.19 kg/m   Physical Exam Vitals and nursing note reviewed.  Constitutional:      General: She is not in acute distress.    Appearance: Normal appearance. She is obese. She is not ill-appearing or toxic-appearing.  HENT:     Head: Normocephalic.     Right Ear: Tympanic membrane and ear canal normal.     Left Ear: Tympanic membrane and ear canal normal.     Nose: Nose normal.     Mouth/Throat:     Mouth: Mucous membranes are moist. No angioedema.     Dentition: No gingival swelling or gum lesions.     Tongue: No lesions.     Pharynx: Uvula midline. Posterior oropharyngeal erythema present.     Comments: Patient has mild erythema of the oropharynx, tongue, no edema, uvula is midline and nonedematous. No gingival swelling or bleeding noted.  Patient has geographic tongue.  No exudates or ulcerations. Eyes:     Pupils: Pupils are equal, round, and reactive to light.  Neck:     Thyroid: No thyromegaly.     Meningeal: Kernig's sign absent.  Cardiovascular:     Rate and Rhythm: Normal rate and regular rhythm.  Pulmonary:     Effort: Pulmonary effort is normal. No respiratory distress.     Breath sounds: Normal breath sounds. No wheezing.  Abdominal:     Palpations: Abdomen is soft.     Tenderness: There is no abdominal tenderness. There is no guarding or rebound.  Musculoskeletal:        General: Normal range of motion.     Cervical back: Normal range of motion and neck supple.  Skin:    General: Skin is warm.     Capillary Refill: Capillary refill takes less than 2 seconds.     Findings: No rash.  Neurological:     General: No focal deficit present.     Mental Status: She is alert.  Sensory: No sensory deficit.     Motor: No weakness.     ED Results / Procedures / Treatments   Labs (all labs ordered are listed, but only abnormal  results are displayed) Labs Reviewed - No data to display  EKG None  Radiology DG Chest 2 View  Result Date: 06/19/2020 CLINICAL DATA:  Cough, COVID positive. EXAM: CHEST - 2 VIEW COMPARISON:  Chest radiograph June 09, 2020 FINDINGS: Mild cardiomegaly, unchanged. Patchy bibasilar opacities opacities. No pleural effusion. No pneumothorax. The visualized skeletal structures are unchanged. IMPRESSION: Stable mild cardiomegaly with patchy bibasilar opacities which may represent pulmonary edema or infection such as COVID 19 pneumonia. Electronically Signed   By: Dahlia Bailiff MD   On: 06/19/2020 16:34    Procedures Procedures   Medications Ordered in ED Medications - No data to display  ED Course  I have reviewed the triage vital signs and the nursing notes.  Pertinent labs & imaging results that were available during my care of the patient were reviewed by me and considered in my medical decision making (see chart for details).    MDM Rules/Calculators/A&P                          Patient well-appearing, nontoxic.  Reported by nursing staff that patient been eating corn chips, requesting soda.  Covid positive since 06/09/2020 here for symptoms of cough and irritation of her tongue and mouth.  No chest pain or shortness of breath.  Afebrile.  Vital signs reassuring.  No tachycardia tachypnea or hypoxia.  Patient does have significant comorbidities including obesity, diabetes, and CHF.  Chest x-ray today shows likely Covid pneumonia.  No symptoms to suggest acute respiratory distress.  Feel that she is appropriate for discharge home.  She would likely benefit from referral to [facility] although patient may be out of treatment window given onset of symptoms.  She declined additional prescription for steroids stating that steroids elevate her blood sugar.  I will provide referral information for her.  Symptoms will be treated with antitussive and Magic mouthwash.  She appears appropriate for  discharge home, return precautions and isolation guidelines discussed.   Final Clinical Impression(s) / ED Diagnoses Final diagnoses:  Pneumonia due to COVID-19 virus    Rx / DC Orders ED Discharge Orders    None       Kem Parkinson, PA-C 06/19/20 1927    Daleen Bo, MD 06/23/20 [id number]

## 2020-06-20 ENCOUNTER — Encounter: Payer: Self-pay | Admitting: Infectious Diseases

## 2020-06-20 NOTE — Progress Notes (Unsigned)
Referral received regarding patient with + COVID dating back to 1/24. She has persistent symptoms and now moderate COVID PNA in review of recent ER visit.   She is beyond 10 day of window for any acute non-inpatient treatment. Patient was not called but will send MyChart with information to self-schedule at [facility] if needs follow up and bridge to primary care.   Will route to referring provider.    Janene Madeira, MSN, NP-C [facility] for Infectious Disease [email or web address] Pager: [phone] Office: [phone] [place]: [phone]

## 2020-07-04 ENCOUNTER — Institutional Professional Consult (permissible substitution): Payer: Medicaid Other | Admitting: Pulmonary Disease

## 2020-07-31 ENCOUNTER — Institutional Professional Consult (permissible substitution): Payer: Medicaid Other | Admitting: Pulmonary Disease

## 2020-08-22 ENCOUNTER — Other Ambulatory Visit: Payer: Self-pay | Admitting: Physician Assistant

## 2020-08-25 ENCOUNTER — Emergency Department (HOSPITAL_COMMUNITY): Payer: Medicaid Other

## 2020-08-25 ENCOUNTER — Other Ambulatory Visit: Payer: Self-pay

## 2020-08-25 ENCOUNTER — Encounter (HOSPITAL_COMMUNITY): Payer: Self-pay | Admitting: Emergency Medicine

## 2020-08-25 DIAGNOSIS — R072 Precordial pain: Secondary | ICD-10-CM | POA: Insufficient documentation

## 2020-08-25 DIAGNOSIS — Z8616 Personal history of COVID-19: Secondary | ICD-10-CM | POA: Diagnosis not present

## 2020-08-25 DIAGNOSIS — Z79899 Other long term (current) drug therapy: Secondary | ICD-10-CM | POA: Insufficient documentation

## 2020-08-25 DIAGNOSIS — Z794 Long term (current) use of insulin: Secondary | ICD-10-CM | POA: Insufficient documentation

## 2020-08-25 DIAGNOSIS — R609 Edema, unspecified: Secondary | ICD-10-CM | POA: Insufficient documentation

## 2020-08-25 DIAGNOSIS — I110 Hypertensive heart disease with heart failure: Secondary | ICD-10-CM | POA: Diagnosis not present

## 2020-08-25 DIAGNOSIS — I5042 Chronic combined systolic (congestive) and diastolic (congestive) heart failure: Secondary | ICD-10-CM | POA: Diagnosis not present

## 2020-08-25 DIAGNOSIS — E119 Type 2 diabetes mellitus without complications: Secondary | ICD-10-CM | POA: Diagnosis not present

## 2020-08-25 DIAGNOSIS — Z7982 Long term (current) use of aspirin: Secondary | ICD-10-CM | POA: Diagnosis not present

## 2020-08-25 NOTE — ED Triage Notes (Signed)
Pt c/o chest tightness, lower back pain and bilateral leg swelling x 2 days.

## 2020-08-26 ENCOUNTER — Emergency Department (HOSPITAL_COMMUNITY)
Admission: EM | Admit: 2020-08-26 | Discharge: 2020-08-26 | Disposition: A | Discharge status: Home / Self Care | Payer: Medicaid Other | Attending: Emergency Medicine | Admitting: Emergency Medicine

## 2020-08-26 DIAGNOSIS — R609 Edema, unspecified: Secondary | ICD-10-CM

## 2020-08-26 LAB — BASIC METABOLIC PANEL
Anion gap: 9 (ref 5–15)
BUN: 24 mg/dL — ABNORMAL HIGH (ref 6–20)
CO2: 27 mmol/L (ref 22–32)
Calcium: 8.9 mg/dL (ref 8.9–10.3)
Chloride: 104 mmol/L (ref 98–111)
Creatinine, Ser: 1.15 mg/dL — ABNORMAL HIGH (ref 0.44–1.00)
GFR, Estimated: 60 mL/min — ABNORMAL LOW (ref >60)
Glucose, Bld: 158 mg/dL — ABNORMAL HIGH (ref 70–99)
Potassium: 3.6 mmol/L (ref 3.5–5.1)
Sodium: 140 mmol/L (ref 135–145)

## 2020-08-26 LAB — CBC
HCT: 34.5 % — ABNORMAL LOW (ref 36.0–46.0)
Hemoglobin: 10.4 g/dL — ABNORMAL LOW (ref 12.0–15.0)
MCH: 22.9 pg — ABNORMAL LOW (ref 26.0–34.0)
MCHC: 30.1 g/dL (ref 30.0–36.0)
MCV: 75.8 fL — ABNORMAL LOW (ref 80.0–100.0)
Platelets: 264 10*3/uL (ref 150–400)
RBC: 4.55 MIL/uL (ref 3.87–5.11)
RDW: 16.1 % — ABNORMAL HIGH (ref 11.5–15.5)
WBC: 9.9 10*3/uL (ref 4.0–10.5)
nRBC: 0 % (ref 0.0–0.2)

## 2020-08-26 LAB — TROPONIN I (HIGH SENSITIVITY)
Troponin I (High Sensitivity): 23 ng/L — ABNORMAL HIGH (ref <18)
Troponin I (High Sensitivity): 26 ng/L — ABNORMAL HIGH (ref <18)

## 2020-08-26 LAB — HEPATIC FUNCTION PANEL
ALT: 23 U/L (ref 0–44)
AST: 25 U/L (ref 15–41)
Albumin: 3.5 g/dL (ref 3.5–5.0)
Alkaline Phosphatase: 70 U/L (ref 38–126)
Bilirubin, Direct: <0.1 mg/dL (ref 0.0–0.2)
Total Bilirubin: 0.5 mg/dL (ref 0.3–1.2)
Total Protein: 7.4 g/dL (ref 6.5–8.1)

## 2020-08-26 LAB — BRAIN NATRIURETIC PEPTIDE: B Natriuretic Peptide: 563 pg/mL — ABNORMAL HIGH (ref 0.0–100.0)

## 2020-08-26 MED ORDER — FUROSEMIDE 10 MG/ML IJ SOLN
80.0000 mg | Freq: Once | INTRAMUSCULAR | Status: AC
  Administered 2020-08-26: 80 mg via INTRAVENOUS
  Filled 2020-08-26: qty 8

## 2020-08-26 MED ORDER — ACETAMINOPHEN 500 MG PO TABS
1000.0000 mg | ORAL_TABLET | Freq: Once | ORAL | Status: AC
  Administered 2020-08-26: 1000 mg via ORAL
  Filled 2020-08-26: qty 2

## 2020-08-26 NOTE — ED Provider Notes (Signed)
[facility] EMERGENCY DEPARTMENT Provider Note   CSN: [id number] Arrival date & time: 08/25/20  2304     History No chief complaint on file.   Chelsey Santiago is a 45 y.o. female.   Chest Pain Pain location:  Substernal area Pain quality: aching and pressure   Pain severity:  Mild Timing:  Constant Chronicity:  Recurrent Context: breathing   Relieved by:  None tried Worsened by:  Nothing Ineffective treatments:  None tried      Past Medical History:  Diagnosis Date  . Anemia    H&H of 10.6/33 and 07/2008 and 11.9/35 and 09/2010  . Anxiety   . Chronic combined systolic and diastolic CHF (congestive heart failure) ([place])   . Depression with anxiety   . Diabetes mellitus without complication ([place])   . Hypertension   . Hypertensive heart disease 2009   Pulmonary edema postpartum; mild to moderate mitral regurgitation when hospitalized for CHF in 2009; Echocardiogram in 12/2009-no MR and normal EF; normal CXR in 09/2010  . Migraine headache   . Miscarriage 03/19/2013  . Obesity 04/16/2009  . Osteoarthritis, knee 03/29/2011  . Preeclampsia   . Pulmonary edema   . Sleep apnea   . Threatened abortion in early pregnancy 03/15/2013    Patient Active Problem List   Diagnosis Date Noted  . Pneumonia due to COVID-19 virus 06/09/2020  . Acute on chronic respiratory failure with hypoxia ([place]) 06/09/2020  . Obstructive sleep apnea 03/29/2020  . Flash pulmonary edema ([place]) 03/27/2020  . Back pain 12/19/2019  . Vaginal discharge 12/19/2019  . Vaginal itching 12/19/2019  . BV (bacterial vaginosis) 12/19/2019  . Chronic hypertension 12/19/2019  . Fibroids 09/26/2019  . S/P hysterectomy 09/26/2019  . Acute blood loss anemia 09/26/2019  . Abdominal pain, epigastric 03/14/2019  . Dysphagia 03/14/2019  . Gastroesophageal reflux disease 03/14/2019  . Class 2 obesity   . Acute exacerbation of CHF (congestive heart failure) ([place]) 10/26/2018  . Chronic combined systolic and  diastolic CHF (congestive heart failure) ([place]) 06/02/2018  . Uncontrolled type 2 diabetes mellitus with hyperglycemia ([place]) 06/02/2018  . Influenza B 05/13/2018  . Hypomagnesemia 05/12/2018  . Headache 05/12/2018  . Upper respiratory tract infection   . HCAP (healthcare-associated pneumonia)   . Constipation 10/17/2017  . Bad headache   . CHF exacerbation ([place]) 09/29/2017  . Iron deficiency anemia 05/16/2017  . Vitamin D deficiency 11/25/2016  . Iron deficiency 11/25/2016  . History of acute myocardial infarction 10/05/2016  . CHF (congestive heart failure) ([place]) 05/06/2016  . Diabetes mellitus with complication ([place]) 83/15/1761  . Leukocytosis 05/06/2016  . Neuropathy 05/06/2016  . Depression 04/16/2016  . Chronic tension-type headache, not intractable 04/16/2016  . AKI (acute kidney injury) ([place])   . Hyperkalemia   . Nonischemic cardiomyopathy ([place])   . Acute on chronic combined systolic and diastolic ACC/AHA stage C congestive heart failure ([place]) 04/03/2016  . Acute on chronic combined systolic and diastolic CHF, NYHA class 4 ([place]) 04/03/2016  . Hypertensive emergency 04/03/2016  . Cardiomyopathy due to hypertension ([place]) 12/22/2015  . Normal coronary arteries 12/22/2015  . Troponin level elevated 12/22/2015  . NSTEMI (non-ST elevated myocardial infarction) ([place]) 12/20/2015  . Dental infection 10/10/2015  . Chest pain 09/05/2015  . Systolic CHF, chronic ([place]) 09/05/2015  . LLQ pain   . Type 2 diabetes mellitus without complication ([place]) 60/73/7106  . Essential hypertension   . 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 Hypertensive urgency 04/22/2014  . Acute CHF ([place]) 04/22/2014  . S/P  cesarean section 04/11/2014  . Acute pulmonary edema ([place]) 04/11/2014  . Postoperative anemia 04/11/2014  . Elevated serum creatinine 04/11/2014  . Preeclampsia, severe 04/09/2014  . Pre-eclampsia superimposed on chronic hypertension, antepartum 04/08/2014  . Dyspnea 04/08/2014  .  Polyhydramnios in third trimester, antepartum 03/14/2014  . Abnormal maternal glucose tolerance, antepartum 03/11/2014  . High-risk pregnancy 03/11/2014  . Pre-existing essential hypertension complicating pregnancy 44/81/8563  . Impaired glucose tolerance during pregnancy, antepartum 11/27/2013  . Leiomyoma of uterus 11/22/2013  . History of gestational diabetes in prior pregnancy, currently pregnant in first trimester 11/22/2013  . Hx of preeclampsia, prior pregnancy, currently pregnant 11/22/2013  . Short interval between pregnancies affecting pregnancy, antepartum 11/22/2013  . Supervision of high-risk pregnancy of elderly primigravida (>= 75 years old at delivery), third trimester 11/22/2013  . Miscarriage 03/19/2013  . Major depressive disorder, single episode, unspecified 09/27/2011  . Hypertension   . Hypertensive cardiovascular disease   . Microcytic anemia   . Osteoarthrosis involving lower leg 03/29/2011  . Hypokalemia 12/12/2009  . OSA on CPAP 12/09/2009  . Morbid obesity ([place]) 04/16/2009    Past Surgical History:  Procedure Laterality Date  . BREAST REDUCTION SURGERY  2002  . CARDIAC CATHETERIZATION N/A 12/22/2015   Procedure: Left Heart Cath and Coronary Angiography;  Surgeon: Peter M Martinique, MD;  Location: [facility];  Service: Cardiovascular;  Laterality: N/A;  . CESAREAN SECTION N/A 04/09/2014   Procedure: CESAREAN SECTION;  Surgeon: Mora Bellman, MD;  Location: [facility];  Service: Obstetrics;  Laterality: N/A;  . CHOLECYSTECTOMY    . HYSTERECTOMY ABDOMINAL WITH SALPINGECTOMY N/A 09/26/2019   Procedure: HYSTERECTOMY ABDOMINAL WITH SALPINGECTOMY;  Surgeon: Florian Buff, MD;  Location: [facility];  Service: Gynecology;  Laterality: N/A;  . LIPOMA EXCISION Right 09/26/2019   Procedure: EXCISION LIPOMA RIGHT VULVAR;  Surgeon: Florian Buff, MD;  Location: [facility];  Service: Gynecology;  Laterality: Right;     OB History    Gravida  11   Para  6   Term  5    Preterm  1   AB  5   Living  6     SAB  3   IAB  2   Ectopic      Multiple  0   Live Births  6           Family History  Problem Relation Age of Onset  . Diabetes Mother 24  . Heart disease Mother   . Hyperlipidemia Paternal Grandfather   . Hypertension Paternal Grandfather   . Heart disease Father   . Hypertension Father   . Heart disease Maternal Grandmother 60  . ADD / ADHD Son   . Hypertension Maternal Uncle   . Heart attack Brother   . Sudden death Neg Hx   . Colon cancer Neg Hx   . Celiac disease Neg Hx   . Inflammatory bowel disease Neg Hx     Social History   Tobacco Use  . Smoking status: Never Smoker  . Smokeless tobacco: Never Used  Vaping Use  . Vaping Use: Never used  Substance Use Topics  . Alcohol use: Yes    Comment: occ  . Drug use: No    Home Medications Prior to Admission medications   Medication Sig Start Date End Date Taking? Authorizing Provider  albuterol (VENTOLIN HFA) 108 (90 Base) MCG/ACT inhaler Inhale 1 puff into the lungs every 6 (six) hours as needed for wheezing or shortness of breath. 03/30/20  Kathie Dike, MD  amLODipine (NORVASC) 10 MG tablet Take 1 tablet (10 mg total) by mouth daily. 09/04/19 03/28/20  Strader, Fransisco Hertz, PA-C  ARIPiprazole (ABILIFY) 10 MG tablet Take 1 tablet (10 mg total) by mouth daily. 10/30/18   Barton Dubois, MD  aspirin EC 81 MG tablet Take 81 mg by mouth daily.    [provider]  budesonide-formoterol (SYMBICORT) 80-4.5 MCG/ACT inhaler Inhale 2 puffs into the lungs 2 (two) times daily. 03/30/20   Kathie Dike, MD  carvedilol (COREG) 25 MG tablet Take 2 tablets (50 mg total) by mouth 2 (two) times daily with a meal. 01/23/20   Branch, Alphonse Guild, MD  clonazePAM (KLONOPIN) 0.5 MG tablet Take 0.5 mg by mouth 2 (two) times daily. 12/19/18   [provider]  dextromethorphan-guaiFENesin (MUCINEX DM) 30-600 MG 12hr tablet Take 1 tablet by mouth 2 (two) times daily. 03/30/20    Kathie Dike, MD  gabapentin (NEURONTIN) 600 MG tablet Take 600 mg by mouth 3 (three) times daily.     [provider]  hydrALAZINE (APRESOLINE) 100 MG tablet Take 1 tablet (100 mg total) by mouth 3 (three) times daily. 01/23/20   Arnoldo Lenis, MD  insulin glargine (LANTUS) 100 UNIT/ML injection Inject 30 Units at bedtime into the skin.     [provider]  insulin lispro (HUMALOG) 100 UNIT/ML injection Inject 15 Units into the skin 3 (three) times daily before meals.     [provider]  Ipratropium-Albuterol (COMBIVENT RESPIMAT) 20-100 MCG/ACT AERS respimat Inhale 1 puff into the lungs every 6 (six) hours as needed for wheezing or shortness of breath. 05/04/18   Manuella Ghazi, Pratik D, DO  linaclotide Rolan Lipa) 145 MCG CAPS capsule Take 1 capsule (145 mcg total) by mouth daily before breakfast. 03/14/19   Erenest Rasher, PA-C  magic mouthwash w/lidocaine SOLN Take 5 mLs by mouth 3 (three) times daily as needed for mouth pain. Swish and spit do not swallow 06/19/20   Triplett, Tammy, PA-C  nitroGLYCERIN (NITROSTAT) 0.4 MG SL tablet Place 1 tablet (0.4 mg total) under the tongue every 5 (five) minutes as needed for chest pain. 09/05/15   Kathie Dike, MD  ondansetron (ZOFRAN ODT) 8 MG disintegrating tablet Take 1 tablet (8 mg total) by mouth every 8 (eight) hours as needed for nausea or vomiting. 09/06/19   Long, Wonda Olds, MD  potassium chloride SA (KLOR-CON M20) 20 MEQ tablet Take 2 tablets (40 mEq total) by mouth daily. 09/04/19 03/28/20  Strader, Fransisco Hertz, PA-C  promethazine-codeine (PHENERGAN WITH CODEINE) 6.25-10 MG/5ML syrup Take 5 mLs by mouth every 6 (six) hours as needed for cough. 06/19/20   Triplett, Tammy, PA-C  pyridostigmine (MESTINON) 60 MG tablet Take 60 mg by mouth every 8 (eight) hours.    [provider]  sacubitril-valsartan (ENTRESTO) 97-103 MG Take 1 tablet by mouth 2 (two) times daily. 03/16/19   Arnoldo Lenis, MD  torsemide (DEMADEX) 20  MG tablet Take 4 tablets (80 mg total) by mouth daily. 03/30/20 06/28/20  Kathie Dike, MD  traZODone (DESYREL) 100 MG tablet Take 2 tablets (200 mg total) by mouth at bedtime. 10/29/18   Barton Dubois, MD  triamcinolone ointment (KENALOG) 0.5 % Apply 1 application topically 2 (two) times daily. 12/19/19   Estill Dooms, NP    Allergies    Diclofenac, Tramadol, and Vicodin [hydrocodone-acetaminophen]  Review of Systems   Review of Systems  Cardiovascular: Positive for chest pain.  All other systems reviewed and are  negative.   Physical Exam Updated Vital Signs BP (!) 189/99   Pulse 78   Temp (!) 97.5 F (36.4 C) (Oral)   Resp 15   Ht 5\' 6"  (1.676 m)   Wt 104.3 kg   LMP 08/21/2019   SpO2 97%   BMI 37.12 kg/m   Physical Exam Vitals and nursing note reviewed.  Constitutional:      Appearance: She is well-developed.  HENT:     Head: Normocephalic and atraumatic.     Mouth/Throat:     Mouth: Mucous membranes are moist.     Pharynx: Oropharynx is clear.  Eyes:     Pupils: Pupils are equal, round, and reactive to light.  Cardiovascular:     Rate and Rhythm: Normal rate and regular rhythm.  Pulmonary:     Effort: No respiratory distress.     Breath sounds: No stridor.  Abdominal:     General: There is no distension.  Musculoskeletal:     Cervical back: Normal range of motion.     Right lower leg: Edema present.     Left lower leg: Edema present.  Neurological:     General: No focal deficit present.     Mental Status: She is alert.     ED Results / Procedures / Treatments   Labs (all labs ordered are listed, but only abnormal results are displayed) Labs Reviewed  BASIC METABOLIC PANEL - Abnormal; Notable for the following components:      Result Value   Glucose, Bld 158 (*)    BUN 24 (*)    Creatinine, Ser 1.15 (*)    GFR, Estimated 60 (*)    All other components within normal limits  CBC - Abnormal; Notable for the following components:   Hemoglobin  10.4 (*)    HCT 34.5 (*)    MCV 75.8 (*)    MCH 22.9 (*)    RDW 16.1 (*)    All other components within normal limits  BRAIN NATRIURETIC PEPTIDE - Abnormal; Notable for the following components:   B Natriuretic Peptide 563.0 (*)    All other components within normal limits  TROPONIN I (HIGH SENSITIVITY) - Abnormal; Notable for the following components:   Troponin I (High Sensitivity) 26 (*)    All other components within normal limits  TROPONIN I (HIGH SENSITIVITY) - Abnormal; Notable for the following components:   Troponin I (High Sensitivity) 23 (*)    All other components within normal limits  HEPATIC FUNCTION PANEL    EKG None  Radiology DG Chest 2 View  Result Date: 08/25/2020 CLINICAL DATA:  Chest pain EXAM: CHEST - 2 VIEW COMPARISON:  06/19/2020 FINDINGS: Cardiomegaly. Both lungs are clear. The visualized skeletal structures are unremarkable. IMPRESSION: Cardiomegaly without acute abnormality of the lungs. Electronically Signed   By: Eddie Candle M.D.   On: 08/25/2020 23:50    Procedures Procedures   Medications Ordered in ED Medications  acetaminophen (TYLENOL) tablet 1,000 mg (1,000 mg Oral Given 08/26/20 0222)  furosemide (LASIX) injection 80 mg (80 mg Intravenous Given 08/26/20 0221)    ED Course  I have reviewed the triage vital signs and the nursing notes.  Pertinent labs & imaging results that were available during my care of the patient were reviewed by me and considered in my medical decision making (see chart for details).    MDM Rules/Calculators/A&P  No ACS. Likely pulm edema with exacerbation of same. Will d/w her cardiologist for further mangement. Started diuresis here.   Final Clinical Impression(s) / ED Diagnoses Final diagnoses:  Edema, unspecified type    Rx / DC Orders ED Discharge Orders    None       Colten Desroches, Corene Cornea, MD 08/26/20 [phone]

## 2020-08-26 NOTE — ED Notes (Signed)
Pt placed on cardiac monitor with BP to set cycle every 30 minutes. Continuous pulse oximeter applied.  

## 2020-08-29 LAB — MICROALBUMIN, URINE: Microalb, Ur: 54

## 2020-08-29 LAB — HEMOGLOBIN A1C: Hemoglobin A1C: 9.8

## 2020-08-29 LAB — COMPREHENSIVE METABOLIC PANEL: GFR calc Af Amer: 82

## 2020-08-29 LAB — TSH: TSH: 0.82 (ref 0.41–5.90)

## 2020-08-29 LAB — CBC AND DIFFERENTIAL
HCT: 35 — AB (ref 36–46)
Hemoglobin: 10.3 — AB (ref 12.0–16.0)

## 2020-08-29 LAB — BASIC METABOLIC PANEL
BUN: 20 (ref 4–21)
Creatinine: 1 (ref 0.5–1.1)
Glucose: 151

## 2020-08-30 ENCOUNTER — Emergency Department (HOSPITAL_COMMUNITY)
Admission: EM | Admit: 2020-08-30 | Discharge: 2020-08-31 | Disposition: A | Discharge status: Home / Self Care | Payer: Medicaid Other | Attending: Emergency Medicine | Admitting: Emergency Medicine

## 2020-08-30 ENCOUNTER — Emergency Department (HOSPITAL_COMMUNITY): Payer: Medicaid Other

## 2020-08-30 ENCOUNTER — Other Ambulatory Visit: Payer: Self-pay

## 2020-08-30 DIAGNOSIS — I110 Hypertensive heart disease with heart failure: Secondary | ICD-10-CM | POA: Diagnosis not present

## 2020-08-30 DIAGNOSIS — I5043 Acute on chronic combined systolic (congestive) and diastolic (congestive) heart failure: Secondary | ICD-10-CM | POA: Insufficient documentation

## 2020-08-30 DIAGNOSIS — Z794 Long term (current) use of insulin: Secondary | ICD-10-CM | POA: Insufficient documentation

## 2020-08-30 DIAGNOSIS — R600 Localized edema: Secondary | ICD-10-CM | POA: Insufficient documentation

## 2020-08-30 DIAGNOSIS — Z79899 Other long term (current) drug therapy: Secondary | ICD-10-CM | POA: Insufficient documentation

## 2020-08-30 DIAGNOSIS — I5023 Acute on chronic systolic (congestive) heart failure: Secondary | ICD-10-CM

## 2020-08-30 DIAGNOSIS — Z8616 Personal history of COVID-19: Secondary | ICD-10-CM | POA: Diagnosis not present

## 2020-08-30 DIAGNOSIS — R0602 Shortness of breath: Secondary | ICD-10-CM | POA: Insufficient documentation

## 2020-08-30 DIAGNOSIS — Z7982 Long term (current) use of aspirin: Secondary | ICD-10-CM | POA: Insufficient documentation

## 2020-08-30 DIAGNOSIS — E1140 Type 2 diabetes mellitus with diabetic neuropathy, unspecified: Secondary | ICD-10-CM | POA: Diagnosis not present

## 2020-08-30 LAB — COMPREHENSIVE METABOLIC PANEL
ALT: 19 U/L (ref 0–44)
AST: 19 U/L (ref 15–41)
Albumin: 3.3 g/dL — ABNORMAL LOW (ref 3.5–5.0)
Alkaline Phosphatase: 70 U/L (ref 38–126)
Anion gap: 9 (ref 5–15)
BUN: 19 mg/dL (ref 6–20)
CO2: 27 mmol/L (ref 22–32)
Calcium: 8.8 mg/dL — ABNORMAL LOW (ref 8.9–10.3)
Chloride: 103 mmol/L (ref 98–111)
Creatinine, Ser: 1 mg/dL (ref 0.44–1.00)
GFR, Estimated: >60 mL/min (ref >60)
Glucose, Bld: 110 mg/dL — ABNORMAL HIGH (ref 70–99)
Potassium: 3.2 mmol/L — ABNORMAL LOW (ref 3.5–5.1)
Sodium: 139 mmol/L (ref 135–145)
Total Bilirubin: 0.4 mg/dL (ref 0.3–1.2)
Total Protein: 7.4 g/dL (ref 6.5–8.1)

## 2020-08-30 LAB — CBC WITH DIFFERENTIAL/PLATELET
Abs Immature Granulocytes: 0.06 10*3/uL (ref 0.00–0.07)
Basophils Absolute: 0.1 10*3/uL (ref 0.0–0.1)
Basophils Relative: 1 %
Eosinophils Absolute: 0.1 10*3/uL (ref 0.0–0.5)
Eosinophils Relative: 1 %
HCT: 33.2 % — ABNORMAL LOW (ref 36.0–46.0)
Hemoglobin: 9.9 g/dL — ABNORMAL LOW (ref 12.0–15.0)
Immature Granulocytes: 1 %
Lymphocytes Relative: 26 %
Lymphs Abs: 2.5 10*3/uL (ref 0.7–4.0)
MCH: 22.5 pg — ABNORMAL LOW (ref 26.0–34.0)
MCHC: 29.8 g/dL — ABNORMAL LOW (ref 30.0–36.0)
MCV: 75.5 fL — ABNORMAL LOW (ref 80.0–100.0)
Monocytes Absolute: 0.7 10*3/uL (ref 0.1–1.0)
Monocytes Relative: 7 %
Neutro Abs: 6.4 10*3/uL (ref 1.7–7.7)
Neutrophils Relative %: 64 %
Platelets: 256 10*3/uL (ref 150–400)
RBC: 4.4 MIL/uL (ref 3.87–5.11)
RDW: 15.6 % — ABNORMAL HIGH (ref 11.5–15.5)
WBC: 9.8 10*3/uL (ref 4.0–10.5)
nRBC: 0 % (ref 0.0–0.2)

## 2020-08-30 LAB — I-STAT BETA HCG BLOOD, ED (MC, WL, AP ONLY): I-stat hCG, quantitative: <5 m[IU]/mL (ref <5)

## 2020-08-30 LAB — BRAIN NATRIURETIC PEPTIDE: B Natriuretic Peptide: 576 pg/mL — ABNORMAL HIGH (ref 0.0–100.0)

## 2020-08-30 MED ORDER — GABAPENTIN 300 MG PO CAPS
600.0000 mg | ORAL_CAPSULE | Freq: Once | ORAL | Status: AC
  Administered 2020-08-30: 600 mg via ORAL
  Filled 2020-08-30: qty 2

## 2020-08-30 MED ORDER — SACUBITRIL-VALSARTAN 97-103 MG PO TABS
1.0000 | ORAL_TABLET | Freq: Once | ORAL | Status: AC
  Administered 2020-08-30: 1 via ORAL
  Filled 2020-08-30: qty 1

## 2020-08-30 MED ORDER — TORSEMIDE 20 MG PO TABS
80.0000 mg | ORAL_TABLET | Freq: Every day | ORAL | Status: DC
  Filled 2020-08-30: qty 4

## 2020-08-30 MED ORDER — CARVEDILOL 12.5 MG PO TABS
12.5000 mg | ORAL_TABLET | Freq: Once | ORAL | Status: AC
  Administered 2020-08-30: 12.5 mg via ORAL
  Filled 2020-08-30: qty 1

## 2020-08-30 MED ORDER — FUROSEMIDE 10 MG/ML IJ SOLN
80.0000 mg | Freq: Once | INTRAMUSCULAR | Status: AC
  Administered 2020-08-30: 80 mg via INTRAVENOUS
  Filled 2020-08-30: qty 8

## 2020-08-30 MED ORDER — HYDRALAZINE HCL 25 MG PO TABS
100.0000 mg | ORAL_TABLET | Freq: Once | ORAL | Status: AC
  Administered 2020-08-30: 100 mg via ORAL
  Filled 2020-08-30: qty 4

## 2020-08-30 NOTE — ED Provider Notes (Signed)
Patient with history of nonischemic cardiomyopathy, EF 30 to 35%, hypertension, diabetes here with increased difficulty breathing, leg swelling abdominal swelling.  Feels fluid overloaded and short of breath.  No chest pain.  No cough, chills or fever. States she has not had her torsemide for 1 week.  Seen in the ED for similar presentation on April 12 and was given IV Lasix.  Torsemide was not prescribed at that time for unclear reasons.  Speaking full sentences, no distress.  Bibasilar crackles.  +2 pitting edema to the lower legs.  She is hypertensive.  She is due for her home medications.  Will check labs and diurese with IV Lasix.  Blood pressure has improved with home medications.  Patient diuresed 1.5 L with IV Lasix.  Hypokalemia is replaced.  Troponin remains flat and inconsistent with ACS.  Patient able to ambulate without desaturation.  Patient able to ambulate without desaturation.  Discussed that no indication about her to the hospital given her improvement in blood pressure and no oxygen requirement.  She has diuresed over 3 L of urine since she is been in the ED. She is concerned about a pharmacy being open on Easter and not be able to get her medications.  States she cannot go to the 24-hour pharmacy in [place]. We will give 1 dose of torsemide for her to take tomorrow at home.  Torsemide prescription sent to her pharmacy.  She states she has her other medications at home including her potassium.  Stressed compliance with her medications as well as her low sodium and salt diet. Follow-up with her PCP and cardiologist.  Return precautions discussed   Ezequiel Essex, MD 08/31/20 [phone]

## 2020-08-30 NOTE — ED Triage Notes (Signed)
Pt c/o SOB and bilateral leg swelling, reports she has been out of her torsemide x 7 days and has been unable to get a RX

## 2020-08-30 NOTE — ED Provider Notes (Signed)
[facility] EMERGENCY DEPARTMENT Provider Note   CSN: [id number] Arrival date & time: 08/30/20  2214     History Chief Complaint  Patient presents with  . Shortness of Breath    Chelsey Santiago is a 45 y.o. female.  HPI 45 year old female with a history of anemia, CHF with an EF of 30- 35%, hypertension, DM type II, migraines presents to the ER with complaints of "swelling" over the last week.  States that she has been out of her torsemide for the last week.  Feels very fluid overloaded, short of breath.  No chest pain.  States that she was waiting for refill from her cardiologist, they had plan to check some lab work and refill her torsemide however they never did.  Denies any cough, fevers, chills.  Feels as though this is a CHF exacerbation.    Past Medical History:  Diagnosis Date  . Anemia    H&H of 10.6/33 and 07/2008 and 11.9/35 and 09/2010  . Anxiety   . Chronic combined systolic and diastolic CHF (congestive heart failure) ([place])   . Depression with anxiety   . Diabetes mellitus without complication ([place])   . Hypertension   . Hypertensive heart disease 2009   Pulmonary edema postpartum; mild to moderate mitral regurgitation when hospitalized for CHF in 2009; Echocardiogram in 12/2009-no MR and normal EF; normal CXR in 09/2010  . Migraine headache   . Miscarriage 03/19/2013  . Obesity 04/16/2009  . Osteoarthritis, knee 03/29/2011  . Preeclampsia   . Pulmonary edema   . Sleep apnea   . Threatened abortion in early pregnancy 03/15/2013    Patient Active Problem List   Diagnosis Date Noted  . Pneumonia due to COVID-19 virus 06/09/2020  . Acute on chronic respiratory failure with hypoxia ([place]) 06/09/2020  . Obstructive sleep apnea 03/29/2020  . Flash pulmonary edema ([place]) 03/27/2020  . Back pain 12/19/2019  . Vaginal discharge 12/19/2019  . Vaginal itching 12/19/2019  . BV (bacterial vaginosis) 12/19/2019  . Chronic hypertension 12/19/2019  . Fibroids 09/26/2019  .  S/P hysterectomy 09/26/2019  . Acute blood loss anemia 09/26/2019  . Abdominal pain, epigastric 03/14/2019  . Dysphagia 03/14/2019  . Gastroesophageal reflux disease 03/14/2019  . Class 2 obesity   . Acute exacerbation of CHF (congestive heart failure) ([place]) 10/26/2018  . Chronic combined systolic and diastolic CHF (congestive heart failure) ([place]) 06/02/2018  . Uncontrolled type 2 diabetes mellitus with hyperglycemia ([place]) 06/02/2018  . Influenza B 05/13/2018  . Hypomagnesemia 05/12/2018  . Headache 05/12/2018  . Upper respiratory tract infection   . HCAP (healthcare-associated pneumonia)   . Constipation 10/17/2017  . Bad headache   . CHF exacerbation ([place]) 09/29/2017  . Iron deficiency anemia 05/16/2017  . Vitamin D deficiency 11/25/2016  . Iron deficiency 11/25/2016  . History of acute myocardial infarction 10/05/2016  . CHF (congestive heart failure) ([place]) 05/06/2016  . Diabetes mellitus with complication ([place]) 40/98/1191  . Leukocytosis 05/06/2016  . Neuropathy 05/06/2016  . Depression 04/16/2016  . Chronic tension-type headache, not intractable 04/16/2016  . AKI (acute kidney injury) ([place])   . Hyperkalemia   . Nonischemic cardiomyopathy ([place])   . Acute on chronic combined systolic and diastolic ACC/AHA stage C congestive heart failure ([place]) 04/03/2016  . Acute on chronic combined systolic and diastolic CHF, NYHA class 4 ([place]) 04/03/2016  . Hypertensive emergency 04/03/2016  . Cardiomyopathy due to hypertension ([place]) 12/22/2015  . Normal coronary arteries 12/22/2015  . Troponin level elevated 12/22/2015  . NSTEMI (  non-ST elevated myocardial infarction) ([place]) 12/20/2015  . Dental infection 10/10/2015  . Chest pain 09/05/2015  . Systolic CHF, chronic ([place]) 09/05/2015  . LLQ pain   . Type 2 diabetes mellitus without complication ([place]) 53/61/4431  . Essential hypertension   . 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 Hypertensive urgency 04/22/2014  . Acute CHF ([place])  04/22/2014  . S/P cesarean section 04/11/2014  . Acute pulmonary edema ([place]) 04/11/2014  . Postoperative anemia 04/11/2014  . Elevated serum creatinine 04/11/2014  . Preeclampsia, severe 04/09/2014  . Pre-eclampsia superimposed on chronic hypertension, antepartum 04/08/2014  . Dyspnea 04/08/2014  . Polyhydramnios in third trimester, antepartum 03/14/2014  . Abnormal maternal glucose tolerance, antepartum 03/11/2014  . High-risk pregnancy 03/11/2014  . Pre-existing essential hypertension complicating pregnancy 54/00/8676  . Impaired glucose tolerance during pregnancy, antepartum 11/27/2013  . Leiomyoma of uterus 11/22/2013  . History of gestational diabetes in prior pregnancy, currently pregnant in first trimester 11/22/2013  . Hx of preeclampsia, prior pregnancy, currently pregnant 11/22/2013  . Short interval between pregnancies affecting pregnancy, antepartum 11/22/2013  . Supervision of high-risk pregnancy of elderly primigravida (>= 74 years old at delivery), third trimester 11/22/2013  . Miscarriage 03/19/2013  . Major depressive disorder, single episode, unspecified 09/27/2011  . Hypertension   . Hypertensive cardiovascular disease   . Microcytic anemia   . Osteoarthrosis involving lower leg 03/29/2011  . Hypokalemia 12/12/2009  . OSA on CPAP 12/09/2009  . Morbid obesity ([place]) 04/16/2009    Past Surgical History:  Procedure Laterality Date  . BREAST REDUCTION SURGERY  2002  . CARDIAC CATHETERIZATION N/A 12/22/2015   Procedure: Left Heart Cath and Coronary Angiography;  Surgeon: Peter M Martinique, MD;  Location: [facility];  Service: Cardiovascular;  Laterality: N/A;  . CESAREAN SECTION N/A 04/09/2014   Procedure: CESAREAN SECTION;  Surgeon: Mora Bellman, MD;  Location: [facility];  Service: Obstetrics;  Laterality: N/A;  . CHOLECYSTECTOMY    . HYSTERECTOMY ABDOMINAL WITH SALPINGECTOMY N/A 09/26/2019   Procedure: HYSTERECTOMY ABDOMINAL WITH SALPINGECTOMY;  Surgeon: Florian Buff, MD;  Location: [facility];  Service: Gynecology;  Laterality: N/A;  . LIPOMA EXCISION Right 09/26/2019   Procedure: EXCISION LIPOMA RIGHT VULVAR;  Surgeon: Florian Buff, MD;  Location: [facility];  Service: Gynecology;  Laterality: Right;     OB History    Gravida  11   Para  6   Term  5   Preterm  1   AB  5   Living  6     SAB  3   IAB  2   Ectopic      Multiple  0   Live Births  6           Family History  Problem Relation Age of Onset  . Diabetes Mother 65  . Heart disease Mother   . Hyperlipidemia Paternal Grandfather   . Hypertension Paternal Grandfather   . Heart disease Father   . Hypertension Father   . Heart disease Maternal Grandmother 60  . ADD / ADHD Son   . Hypertension Maternal Uncle   . Heart attack Brother   . Sudden death Neg Hx   . Colon cancer Neg Hx   . Celiac disease Neg Hx   . Inflammatory bowel disease Neg Hx     Social History   Tobacco Use  . Smoking status: Never Smoker  . Smokeless tobacco: Never Used  Vaping Use  . Vaping Use: Never used  Substance Use Topics  .  Alcohol use: Yes    Comment: occ  . Drug use: No    Home Medications Prior to Admission medications   Medication Sig Start Date End Date Taking? Authorizing Provider  albuterol (VENTOLIN HFA) 108 (90 Base) MCG/ACT inhaler Inhale 1 puff into the lungs every 6 (six) hours as needed for wheezing or shortness of breath. 03/30/20   Kathie Dike, MD  amLODipine (NORVASC) 10 MG tablet Take 1 tablet (10 mg total) by mouth daily. 09/04/19 03/28/20  Strader, Fransisco Hertz, PA-C  ARIPiprazole (ABILIFY) 10 MG tablet Take 1 tablet (10 mg total) by mouth daily. 10/30/18   Barton Dubois, MD  aspirin EC 81 MG tablet Take 81 mg by mouth daily.    [provider]  budesonide-formoterol (SYMBICORT) 80-4.5 MCG/ACT inhaler Inhale 2 puffs into the lungs 2 (two) times daily. 03/30/20   Kathie Dike, MD  carvedilol (COREG) 25 MG tablet Take 2 tablets (50 mg total) by  mouth 2 (two) times daily with a meal. 01/23/20   Branch, Alphonse Guild, MD  clonazePAM (KLONOPIN) 0.5 MG tablet Take 0.5 mg by mouth 2 (two) times daily. 12/19/18   [provider]  dextromethorphan-guaiFENesin (MUCINEX DM) 30-600 MG 12hr tablet Take 1 tablet by mouth 2 (two) times daily. 03/30/20   Kathie Dike, MD  gabapentin (NEURONTIN) 600 MG tablet Take 600 mg by mouth 3 (three) times daily.     [provider]  hydrALAZINE (APRESOLINE) 100 MG tablet Take 1 tablet (100 mg total) by mouth 3 (three) times daily. 01/23/20   Arnoldo Lenis, MD  insulin glargine (LANTUS) 100 UNIT/ML injection Inject 30 Units at bedtime into the skin.     [provider]  insulin lispro (HUMALOG) 100 UNIT/ML injection Inject 15 Units into the skin 3 (three) times daily before meals.     [provider]  Ipratropium-Albuterol (COMBIVENT RESPIMAT) 20-100 MCG/ACT AERS respimat Inhale 1 puff into the lungs every 6 (six) hours as needed for wheezing or shortness of breath. 05/04/18   Manuella Ghazi, Pratik D, DO  linaclotide Rolan Lipa) 145 MCG CAPS capsule Take 1 capsule (145 mcg total) by mouth daily before breakfast. 03/14/19   Erenest Rasher, PA-C  magic mouthwash w/lidocaine SOLN Take 5 mLs by mouth 3 (three) times daily as needed for mouth pain. Swish and spit do not swallow 06/19/20   Triplett, Tammy, PA-C  nitroGLYCERIN (NITROSTAT) 0.4 MG SL tablet Place 1 tablet (0.4 mg total) under the tongue every 5 (five) minutes as needed for chest pain. 09/05/15   Kathie Dike, MD  ondansetron (ZOFRAN ODT) 8 MG disintegrating tablet Take 1 tablet (8 mg total) by mouth every 8 (eight) hours as needed for nausea or vomiting. 09/06/19   Long, Wonda Olds, MD  potassium chloride SA (KLOR-CON M20) 20 MEQ tablet Take 2 tablets (40 mEq total) by mouth daily. 09/04/19 03/28/20  Strader, Fransisco Hertz, PA-C  promethazine-codeine (PHENERGAN WITH CODEINE) 6.25-10 MG/5ML syrup Take 5 mLs by mouth every 6 (six) hours as  needed for cough. 06/19/20   Triplett, Tammy, PA-C  pyridostigmine (MESTINON) 60 MG tablet Take 60 mg by mouth every 8 (eight) hours.    [provider]  sacubitril-valsartan (ENTRESTO) 97-103 MG Take 1 tablet by mouth 2 (two) times daily. 03/16/19   Arnoldo Lenis, MD  torsemide (DEMADEX) 20 MG tablet Take 4 tablets (80 mg total) by mouth daily. 03/30/20 06/28/20  Kathie Dike, MD  traZODone (DESYREL) 100 MG tablet Take 2 tablets (200 mg total) by mouth  at bedtime. 10/29/18   Barton Dubois, MD  triamcinolone ointment (KENALOG) 0.5 % Apply 1 application topically 2 (two) times daily. 12/19/19   Estill Dooms, NP    Allergies    Diclofenac, Tramadol, and Vicodin [hydrocodone-acetaminophen]  Review of Systems   Review of Systems  Constitutional: Negative for chills and fever.  HENT: Negative for ear pain and sore throat.   Eyes: Negative for pain and visual disturbance.  Respiratory: Positive for shortness of breath. Negative for cough.   Cardiovascular: Positive for leg swelling. Negative for chest pain and palpitations.  Gastrointestinal: Negative for abdominal pain and vomiting.  Genitourinary: Negative for dysuria and hematuria.  Musculoskeletal: Negative for arthralgias and back pain.  Skin: Negative for color change and rash.  Neurological: Negative for seizures and syncope.  All other systems reviewed and are negative.   Physical Exam Updated Vital Signs BP (!) 196/110 (BP Location: Right Arm)   Pulse 95   Temp 98 F (36.7 C) (Oral)   Resp 18   Ht 5\' 6"  (1.676 m)   Wt 117.8 kg   LMP 08/21/2019   SpO2 99%   BMI 41.93 kg/m   Physical Exam Vitals and nursing note reviewed.  Constitutional:      General: She is not in acute distress.    Appearance: She is well-developed. She is obese.  HENT:     Head: Normocephalic and atraumatic.  Eyes:     Conjunctiva/sclera: Conjunctivae normal.  Cardiovascular:     Rate and Rhythm: Normal rate and regular  rhythm.     Heart sounds: No murmur heard.   Pulmonary:     Effort: Pulmonary effort is normal. No respiratory distress.     Breath sounds: Normal breath sounds. No decreased breath sounds, wheezing, rhonchi or rales.  Chest:     Chest wall: No tenderness.  Abdominal:     Palpations: Abdomen is soft.     Tenderness: There is no abdominal tenderness.  Musculoskeletal:     Cervical back: Neck supple.     Right lower leg: Edema present.     Left lower leg: Edema present.     Comments: 2+ nonpitting edema to lower extremities bilaterally  Skin:    General: Skin is warm and dry.  Neurological:     General: No focal deficit present.     Mental Status: She is alert.     ED Results / Procedures / Treatments   Labs (all labs ordered are listed, but only abnormal results are displayed) Labs Reviewed  CBC WITH DIFFERENTIAL/PLATELET  COMPREHENSIVE METABOLIC PANEL  BRAIN NATRIURETIC PEPTIDE  I-STAT BETA HCG BLOOD, ED ([facility], [facility], [facility] ONLY)    EKG EKG Interpretation  Date/Time:  Saturday August 30 2020 22:36:46 EDT Ventricular Rate:  92 PR Interval:  174 QRS Duration: 103 QT Interval:  382 QTC Calculation: 473 R Axis:   124 Text Interpretation: Sinus rhythm Ventricular premature complex Right axis deviation Nonspecific repol abnormality, diffuse leads Baseline wander in lead(s) V5 Nonspecific T wave abnormality Confirmed by Ezequiel Essex [phone]) on 08/30/2020 10:51:02 PM   Radiology No results found.  Procedures Procedures   Medications Ordered in ED Medications  torsemide (DEMADEX) tablet 80 mg (has no administration in time range)    ED Course  I have reviewed the triage vital signs and the nursing notes.  Pertinent labs & imaging results that were available during my care of the patient were reviewed by me and considered in my medical decision making (see chart  for details).    MDM Rules/Calculators/A&P                          45 year old female who presents to  the ER with complaints of shortness of breath and swelling.  States she has been out of her torsemide for almost a week.  On arrival, she is hypertensive with a blood pressure 196/110, afebrile, not tachycardic, tachypneic or hypoxic.  She has no chest pain.  She does have 2+ nonpitting edema to lower extremities.  Lung sounds are clear.  No evidence of hypoxia.  Plan for labs, chest x-ray, will give home dose of torsemide. Signed out care to Dr. Wyvonnia Dusky who will oversee the rest of her care.  The patient feels better after diuresis, and her labs are overall reassuring, suspect she will be able to be discharged. Final Clinical Impression(s) / ED Diagnoses Final diagnoses:  None    Rx / DC Orders ED Discharge Orders    None       Lyndel Safe 08/30/20 2255    Ezequiel Essex, MD 08/31/20 [phone]

## 2020-08-30 NOTE — ED Notes (Signed)
Pt A&Ox3, states she was here about 4-5 days ago for bilateral lower extremity edema. Was told her labs were being checked to determine if her torsemide needed to be adjusted. Pt states she was told to not take the torsemide until the labs came back. Is here today c/o exertional dyspnea while out at an [employer] today. Also c/o generalized edema "everywhere" and her legs feeling "tight." non-pitting edema noted bilateral legs. Speaking in full sentences. Bilateral diminished lung sounds noted. Speaking in full sentences.

## 2020-08-31 LAB — TROPONIN I (HIGH SENSITIVITY)
Troponin I (High Sensitivity): 21 ng/L — ABNORMAL HIGH (ref <18)
Troponin I (High Sensitivity): 22 ng/L — ABNORMAL HIGH (ref <18)

## 2020-08-31 MED ORDER — POTASSIUM CHLORIDE CRYS ER 20 MEQ PO TBCR
40.0000 meq | EXTENDED_RELEASE_TABLET | Freq: Once | ORAL | Status: AC
  Administered 2020-08-31: 40 meq via ORAL
  Filled 2020-08-31: qty 2

## 2020-08-31 MED ORDER — TORSEMIDE 20 MG PO TABS: 80.0000 mg | ORAL_TABLET | Freq: Every day | ORAL | 0 refills | Status: DC

## 2020-08-31 MED ORDER — NITROGLYCERIN 0.4 MG SL SUBL
0.4000 mg | SUBLINGUAL_TABLET | Freq: Once | SUBLINGUAL | Status: AC
  Administered 2020-08-31: 0.4 mg via SUBLINGUAL
  Filled 2020-08-31: qty 1

## 2020-08-31 MED ORDER — TORSEMIDE 20 MG PO TABS
80.0000 mg | ORAL_TABLET | Freq: Once | ORAL | Status: DC
  Filled 2020-08-31: qty 4

## 2020-08-31 MED ORDER — GABAPENTIN 600 MG PO TABS: 600.0000 mg | ORAL_TABLET | Freq: Three times a day (TID) | ORAL | 0 refills | Status: DC

## 2020-08-31 NOTE — ED Notes (Signed)
Pt was ambulated while monitoring O2, while ambulating pts O2 went from 99% to 97%. Pt ambulated just fine pt only stated that she was requesting pain medicine. RN informed, pt was placed back into bed and hooked back up to all monitors

## 2020-08-31 NOTE — ED Notes (Signed)
Pt shouting at staff "I'm not going home like this", EDP and RN at bedside. Pt updated on POC, reviewed relevant information, awaiting any orders from EDP.
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
## 2020-08-31 NOTE — Discharge Instructions (Addendum)
Take your torsemide as prescribed.  Follow your fluid and salt restriction diet per Dr. Harl Bowie.  Follow-up with Dr. Legrand Rams or Dr. Harl Bowie on Monday.  Return to the ED with difficulty breathing, chest pain, not able to eat or drink or any other concerns.

## 2020-09-01 ENCOUNTER — Ambulatory Visit: Payer: Medicaid Other | Admitting: Family Medicine

## 2020-09-02 NOTE — Patient Instructions (Signed)
Diabetes Mellitus and Nutrition, Adult When you have diabetes, or diabetes mellitus, it is very important to have healthy eating habits because your blood sugar (glucose) levels are greatly affected by what you eat and drink. Eating healthy foods in the right amounts, at about the same times every day, can help you:  Control your blood glucose.  Lower your risk of heart disease.  Improve your blood pressure.  Reach or maintain a healthy weight. What can affect my meal plan? Every person with diabetes is different, and each person has different needs for a meal plan. Your health care provider may recommend that you work with a dietitian to make a meal plan that is best for you. Your meal plan may vary depending on factors such as:  The calories you need.  The medicines you take.  Your weight.  Your blood glucose, blood pressure, and cholesterol levels.  Your activity level.  Other health conditions you have, such as heart or kidney disease. How do carbohydrates affect me? Carbohydrates, also called carbs, affect your blood glucose level more than any other type of food. Eating carbs naturally raises the amount of glucose in your blood. Carb counting is a method for keeping track of how many carbs you eat. Counting carbs is important to keep your blood glucose at a healthy level, especially if you use insulin or take certain oral diabetes medicines. It is important to know how many carbs you can safely have in each meal. This is different for every person. Your dietitian can help you calculate how many carbs you should have at each meal and for each snack. How does alcohol affect me? Alcohol can cause a sudden decrease in blood glucose (hypoglycemia), especially if you use insulin or take certain oral diabetes medicines. Hypoglycemia can be a life-threatening condition. Symptoms of hypoglycemia, such as sleepiness, dizziness, and confusion, are similar to symptoms of having too much  alcohol.  Do not drink alcohol if: ? Your health care provider tells you not to drink. ? You are pregnant, may be pregnant, or are planning to become pregnant.  If you drink alcohol: ? Do not drink on an empty stomach. ? Limit how much you use to:  0-1 drink a day for women.  0-2 drinks a day for men. ? Be aware of how much alcohol is in your drink. In the [place] one drink equals one 12 oz bottle of beer (355 mL), one 5 oz glass of wine (148 mL), or one 1 oz glass of hard liquor (44 mL). ? Keep yourself hydrated with water, diet soda, or unsweetened iced tea.  Keep in mind that regular soda, juice, and other mixers may contain a lot of sugar and must be counted as carbs. What are tips for following this plan? Reading food labels  Start by checking the serving size on the "Nutrition Facts" label of packaged foods and drinks. The amount of calories, carbs, fats, and other nutrients listed on the label is based on one serving of the item. Many items contain more than one serving per package.  Check the total grams (g) of carbs in one serving. You can calculate the number of servings of carbs in one serving by dividing the total carbs by 15. For example, if a food has 30 g of total carbs per serving, it would be equal to 2 servings of carbs.  Check the number of grams (g) of saturated fats and trans fats in one serving. Choose foods that have   a low amount or none of these fats.  Check the number of milligrams (mg) of salt (sodium) in one serving. Most people should limit total sodium intake to less than 2,300 mg per day.  Always check the nutrition information of foods labeled as "low-fat" or "nonfat." These foods may be higher in added sugar or refined carbs and should be avoided.  Talk to your dietitian to identify your daily goals for nutrients listed on the label. Shopping  Avoid buying canned, pre-made, or processed foods. These foods tend to be high in fat, sodium, and added  sugar.  Shop around the outside edge of the grocery store. This is where you will most often find fresh fruits and vegetables, bulk grains, fresh meats, and fresh dairy. Cooking  Use low-heat cooking methods, such as baking, instead of high-heat cooking methods like deep frying.  Cook using healthy oils, such as olive, canola, or sunflower oil.  Avoid cooking with butter, cream, or high-fat meats. Meal planning  Eat meals and snacks regularly, preferably at the same times every day. Avoid going long periods of time without eating.  Eat foods that are high in fiber, such as fresh fruits, vegetables, beans, and whole grains. Talk with your dietitian about how many servings of carbs you can eat at each meal.  Eat 4-6 oz (112-168 g) of lean protein each day, such as lean meat, chicken, fish, eggs, or tofu. One ounce (oz) of lean protein is equal to: ? 1 oz (28 g) of meat, chicken, or fish. ? 1 egg. ?  cup (62 g) of tofu.  Eat some foods each day that contain healthy fats, such as avocado, nuts, seeds, and fish.   What foods should I eat? Fruits Berries. Apples. Oranges. Peaches. Apricots. Plums. Grapes. Mango. Papaya. Pomegranate. Kiwi. Cherries. Vegetables Lettuce. Spinach. Leafy greens, including kale, chard, collard greens, and mustard greens. Beets. Cauliflower. Cabbage. Broccoli. Carrots. Green beans. Tomatoes. Peppers. Onions. Cucumbers. Brussels sprouts. Grains Whole grains, such as whole-wheat or whole-grain bread, crackers, tortillas, cereal, and pasta. Unsweetened oatmeal. Quinoa. Brown or wild rice. Meats and other proteins Seafood. Poultry without skin. Lean cuts of poultry and beef. Tofu. Nuts. Seeds. Dairy Low-fat or fat-free dairy products such as milk, yogurt, and cheese. The items listed above may not be a complete list of foods and beverages you can eat. Contact a dietitian for more information. What foods should I avoid? Fruits Fruits canned with  syrup. Vegetables Canned vegetables. Frozen vegetables with butter or cream sauce. Grains Refined white flour and flour products such as bread, pasta, snack foods, and cereals. Avoid all processed foods. Meats and other proteins Fatty cuts of meat. Poultry with skin. Breaded or fried meats. Processed meat. Avoid saturated fats. Dairy Full-fat yogurt, cheese, or milk. Beverages Sweetened drinks, such as soda or iced tea. The items listed above may not be a complete list of foods and beverages you should avoid. Contact a dietitian for more information. Questions to ask a health care provider  Do I need to meet with a diabetes educator?  Do I need to meet with a dietitian?  What number can I call if I have questions?  When are the best times to check my blood glucose? Where to find more information:  American Diabetes Association: diabetes.org  Academy of Nutrition and Dietetics: www.eatright.org  National Institute of Diabetes and Digestive and Kidney Diseases: www.niddk.nih.gov  Association of Diabetes Care and Education Specialists: www.diabeteseducator.org Summary  It is important to have healthy eating   habits because your blood sugar (glucose) levels are greatly affected by what you eat and drink.  A healthy meal plan will help you control your blood glucose and maintain a healthy lifestyle.  Your health care provider may recommend that you work with a dietitian to make a meal plan that is best for you.  Keep in mind that carbohydrates (carbs) and alcohol have immediate effects on your blood glucose levels. It is important to count carbs and to use alcohol carefully. This information is not intended to replace advice given to you by your health care provider. Make sure you discuss any questions you have with your health care provider. Document Revised: 04/10/2019 Document Reviewed: 04/10/2019 Elsevier Patient Education  2021 Elsevier Inc.  

## 2020-09-03 ENCOUNTER — Encounter: Payer: Self-pay | Admitting: Nurse Practitioner

## 2020-09-03 DIAGNOSIS — E1165 Type 2 diabetes mellitus with hyperglycemia: Secondary | ICD-10-CM

## 2020-09-03 DIAGNOSIS — I10 Essential (primary) hypertension: Secondary | ICD-10-CM

## 2020-09-03 DIAGNOSIS — E782 Mixed hyperlipidemia: Secondary | ICD-10-CM

## 2020-09-03 NOTE — Patient Instructions (Signed)
Diabetes Mellitus and Nutrition, Adult When you have diabetes, or diabetes mellitus, it is very important to have healthy eating habits because your blood sugar (glucose) levels are greatly affected by what you eat and drink. Eating healthy foods in the right amounts, at about the same times every day, can help you:  Control your blood glucose.  Lower your risk of heart disease.  Improve your blood pressure.  Reach or maintain a healthy weight. What can affect my meal plan? Every person with diabetes is different, and each person has different needs for a meal plan. Your health care provider may recommend that you work with a dietitian to make a meal plan that is best for you. Your meal plan may vary depending on factors such as:  The calories you need.  The medicines you take.  Your weight.  Your blood glucose, blood pressure, and cholesterol levels.  Your activity level.  Other health conditions you have, such as heart or kidney disease. How do carbohydrates affect me? Carbohydrates, also called carbs, affect your blood glucose level more than any other type of food. Eating carbs naturally raises the amount of glucose in your blood. Carb counting is a method for keeping track of how many carbs you eat. Counting carbs is important to keep your blood glucose at a healthy level, especially if you use insulin or take certain oral diabetes medicines. It is important to know how many carbs you can safely have in each meal. This is different for every person. Your dietitian can help you calculate how many carbs you should have at each meal and for each snack. How does alcohol affect me? Alcohol can cause a sudden decrease in blood glucose (hypoglycemia), especially if you use insulin or take certain oral diabetes medicines. Hypoglycemia can be a life-threatening condition. Symptoms of hypoglycemia, such as sleepiness, dizziness, and confusion, are similar to symptoms of having too much  alcohol.  Do not drink alcohol if: ? Your health care provider tells you not to drink. ? You are pregnant, may be pregnant, or are planning to become pregnant.  If you drink alcohol: ? Do not drink on an empty stomach. ? Limit how much you use to:  0-1 drink a day for women.  0-2 drinks a day for men. ? Be aware of how much alcohol is in your drink. In the [place] one drink equals one 12 oz bottle of beer (355 mL), one 5 oz glass of wine (148 mL), or one 1 oz glass of hard liquor (44 mL). ? Keep yourself hydrated with water, diet soda, or unsweetened iced tea.  Keep in mind that regular soda, juice, and other mixers may contain a lot of sugar and must be counted as carbs. What are tips for following this plan? Reading food labels  Start by checking the serving size on the "Nutrition Facts" label of packaged foods and drinks. The amount of calories, carbs, fats, and other nutrients listed on the label is based on one serving of the item. Many items contain more than one serving per package.  Check the total grams (g) of carbs in one serving. You can calculate the number of servings of carbs in one serving by dividing the total carbs by 15. For example, if a food has 30 g of total carbs per serving, it would be equal to 2 servings of carbs.  Check the number of grams (g) of saturated fats and trans fats in one serving. Choose foods that have   a low amount or none of these fats.  Check the number of milligrams (mg) of salt (sodium) in one serving. Most people should limit total sodium intake to less than 2,300 mg per day.  Always check the nutrition information of foods labeled as "low-fat" or "nonfat." These foods may be higher in added sugar or refined carbs and should be avoided.  Talk to your dietitian to identify your daily goals for nutrients listed on the label. Shopping  Avoid buying canned, pre-made, or processed foods. These foods tend to be high in fat, sodium, and added  sugar.  Shop around the outside edge of the grocery store. This is where you will most often find fresh fruits and vegetables, bulk grains, fresh meats, and fresh dairy. Cooking  Use low-heat cooking methods, such as baking, instead of high-heat cooking methods like deep frying.  Cook using healthy oils, such as olive, canola, or sunflower oil.  Avoid cooking with butter, cream, or high-fat meats. Meal planning  Eat meals and snacks regularly, preferably at the same times every day. Avoid going long periods of time without eating.  Eat foods that are high in fiber, such as fresh fruits, vegetables, beans, and whole grains. Talk with your dietitian about how many servings of carbs you can eat at each meal.  Eat 4-6 oz (112-168 g) of lean protein each day, such as lean meat, chicken, fish, eggs, or tofu. One ounce (oz) of lean protein is equal to: ? 1 oz (28 g) of meat, chicken, or fish. ? 1 egg. ?  cup (62 g) of tofu.  Eat some foods each day that contain healthy fats, such as avocado, nuts, seeds, and fish.   What foods should I eat? Fruits Berries. Apples. Oranges. Peaches. Apricots. Plums. Grapes. Mango. Papaya. Pomegranate. Kiwi. Cherries. Vegetables Lettuce. Spinach. Leafy greens, including kale, chard, collard greens, and mustard greens. Beets. Cauliflower. Cabbage. Broccoli. Carrots. Green beans. Tomatoes. Peppers. Onions. Cucumbers. Brussels sprouts. Grains Whole grains, such as whole-wheat or whole-grain bread, crackers, tortillas, cereal, and pasta. Unsweetened oatmeal. Quinoa. Brown or wild rice. Meats and other proteins Seafood. Poultry without skin. Lean cuts of poultry and beef. Tofu. Nuts. Seeds. Dairy Low-fat or fat-free dairy products such as milk, yogurt, and cheese. The items listed above may not be a complete list of foods and beverages you can eat. Contact a dietitian for more information. What foods should I avoid? Fruits Fruits canned with  syrup. Vegetables Canned vegetables. Frozen vegetables with butter or cream sauce. Grains Refined white flour and flour products such as bread, pasta, snack foods, and cereals. Avoid all processed foods. Meats and other proteins Fatty cuts of meat. Poultry with skin. Breaded or fried meats. Processed meat. Avoid saturated fats. Dairy Full-fat yogurt, cheese, or milk. Beverages Sweetened drinks, such as soda or iced tea. The items listed above may not be a complete list of foods and beverages you should avoid. Contact a dietitian for more information. Questions to ask a health care provider  Do I need to meet with a diabetes educator?  Do I need to meet with a dietitian?  What number can I call if I have questions?  When are the best times to check my blood glucose? Where to find more information:  American Diabetes Association: diabetes.org  Academy of Nutrition and Dietetics: www.eatright.org  National Institute of Diabetes and Digestive and Kidney Diseases: www.niddk.nih.gov  Association of Diabetes Care and Education Specialists: www.diabeteseducator.org Summary  It is important to have healthy eating   habits because your blood sugar (glucose) levels are greatly affected by what you eat and drink.  A healthy meal plan will help you control your blood glucose and maintain a healthy lifestyle.  Your health care provider may recommend that you work with a dietitian to make a meal plan that is best for you.  Keep in mind that carbohydrates (carbs) and alcohol have immediate effects on your blood glucose levels. It is important to count carbs and to use alcohol carefully. This information is not intended to replace advice given to you by your health care provider. Make sure you discuss any questions you have with your health care provider. Document Revised: 04/10/2019 Document Reviewed: 04/10/2019 Elsevier Patient Education  2021 Elsevier Inc.  

## 2020-09-03 NOTE — Progress Notes (Signed)
Erroneous encounter

## 2020-09-04 ENCOUNTER — Encounter: Payer: Self-pay | Admitting: Nurse Practitioner

## 2020-09-04 ENCOUNTER — Ambulatory Visit (INDEPENDENT_AMBULATORY_CARE_PROVIDER_SITE_OTHER): Payer: Medicaid Other | Admitting: Nurse Practitioner

## 2020-09-04 ENCOUNTER — Telehealth: Payer: Self-pay | Admitting: Nurse Practitioner

## 2020-09-04 ENCOUNTER — Other Ambulatory Visit: Payer: Self-pay

## 2020-09-04 ENCOUNTER — Telehealth: Payer: Self-pay | Admitting: Family Medicine

## 2020-09-04 VITALS — BP 156/104 | HR 91 | Ht 66.0 in | Wt 236.0 lb

## 2020-09-04 DIAGNOSIS — I10 Essential (primary) hypertension: Secondary | ICD-10-CM | POA: Diagnosis not present

## 2020-09-04 DIAGNOSIS — E782 Mixed hyperlipidemia: Secondary | ICD-10-CM | POA: Diagnosis not present

## 2020-09-04 DIAGNOSIS — E1165 Type 2 diabetes mellitus with hyperglycemia: Secondary | ICD-10-CM

## 2020-09-04 MED ORDER — LIRAGLUTIDE 18 MG/3ML ~~LOC~~ SOPN: 1.2000 mg | PEN_INJECTOR | Freq: Every day | SUBCUTANEOUS | 3 refills | Status: DC

## 2020-09-04 NOTE — Telephone Encounter (Signed)
yes

## 2020-09-04 NOTE — Progress Notes (Signed)
Cardiology Office Note  Date: 09/05/2020   ID: Chelsey Santiago, DOB [birth year], MRN [id number]  PCP:  Rosita Fire, MD  Cardiologist:  Carlyle Dolly, MD Electrophysiologist:  None    Chief Complaint: ER follow-up  History of Present Illness: Chelsey Santiago is a 45 y.o. female with a history of hypertension, DM 2, combined systolic and diastolic heart failure, nonischemic cardiomyopathy, anemia, obesity, pulmonary edema, sleep apnea   She had last seen [place] on 09/04/2019.  She reported doing overall well from a cardiac perspective.  Her breathing had significantly improved.  She had no orthopnea PND.  No lower extremity edema.  She was listed as taking torsemide twice a day but reported only taking it once a day and had remained on K-Dur 40 mg p.o. twice daily with elevated K+ at 5.4.  Chelsey Santiago increased her amlodipine from 5 to 10 mg daily.  She was continuing Coreg 50 mg p.o. twice daily, Entresto 97/103 p.o. twice daily, torsemide 40 mg p.o. daily.  Her K-Dur dose was reduced due to elevated potassium.  Her repeat sleep study was canceled and she wished to reschedule after her hysterectomy.  . Recent presentation to ED on 08/30/2020 with increased difficulty breathing, leg swelling, abdominal swelling.  She felt fluid overloaded and short of breath.  No chest pain.  She had not taken her torsemide for 1 week.  She had been seen for similar presentation April 12 and was given IV Lasix.  Torsemide had not been prescribed at that time for unclear reasons.  She was given IV Lasix and diuresed 1.5 L.  Hypokalemia was replaced.  Troponins were flat and inconsistent with ACS.  She was able to ambulate without desaturation.  She eventually diuresed 3 L of urine since being in the emergency room.  An extra dose of torsemide was given for her to take the next day at home.  Provider stressed compliance with medications as well as low-sodium diet.  Potassium was 3.2.  BNP was 576.   Hemoglobin 9.9, hematocrit 33.2, troponin 26-23-21.  Chest x-ray cardiomegaly and vascular congestion.   She is here today for follow-up status post recent presentation to ED on 08/30/2020.  She states she had run out of her torsemide and was taking furosemide.  She states her weight was 240 pounds a week or so prior to presentation to ED.  She states upon presenting to the ED her weight was 259 and she was swollen in her abdomen, lower extremities and face.  She received IV diuresis and diuresed approximately 3 L.  She was given an extra dose to take the next day at home.  She states recently she weighed 230 pounds and has started gaining weight and weighed 238 pounds this morning.  Her weight on scale today is 236.  She currently only has some Lasix at home.  States her face is swelling again and abdomen is swelling also.  She states she does not want to get into the shape she was then prior to presenting to the ED on April 16.  Blood pressure is much improved today from previous measurements.  Blood pressure 138/88.  States she is compliant with all of her medications.  However she states she was told by pharmacy was through early to fill her torsemide prescription.   Past Medical History:  Diagnosis Date  . Anemia    H&H of 10.6/33 and 07/2008 and 11.9/35 and 09/2010  . Anxiety   . Chronic combined systolic and  diastolic CHF (congestive heart failure) ([place])   . Depression with anxiety   . Diabetes mellitus without complication ([place])   . Hypertension   . Hypertensive heart disease 2009   Pulmonary edema postpartum; mild to moderate mitral regurgitation when hospitalized for CHF in 2009; Echocardiogram in 12/2009-no MR and normal EF; normal CXR in 09/2010  . Migraine headache   . Miscarriage 03/19/2013  . Obesity 04/16/2009  . Osteoarthritis, knee 03/29/2011  . Preeclampsia   . Pulmonary edema   . Sleep apnea   . Threatened abortion in early pregnancy 03/15/2013    Past Surgical History:   Procedure Laterality Date  . BREAST REDUCTION SURGERY  2002  . CARDIAC CATHETERIZATION N/A 12/22/2015   Procedure: Left Heart Cath and Coronary Angiography;  Surgeon: Peter M Martinique, MD;  Location: [facility];  Service: Cardiovascular;  Laterality: N/A;  . CESAREAN SECTION N/A 04/09/2014   Procedure: CESAREAN SECTION;  Surgeon: Mora Bellman, MD;  Location: [facility];  Service: Obstetrics;  Laterality: N/A;  . CHOLECYSTECTOMY    . HYSTERECTOMY ABDOMINAL WITH SALPINGECTOMY N/A 09/26/2019   Procedure: HYSTERECTOMY ABDOMINAL WITH SALPINGECTOMY;  Surgeon: Florian Buff, MD;  Location: [facility];  Service: Gynecology;  Laterality: N/A;  . LIPOMA EXCISION Right 09/26/2019   Procedure: EXCISION LIPOMA RIGHT VULVAR;  Surgeon: Florian Buff, MD;  Location: [facility];  Service: Gynecology;  Laterality: Right;    Current Outpatient Medications  Medication Sig Dispense Refill  . ACCU-CHEK GUIDE test strip USE AS DIRECTED TO TESTCBLOOD SUGAR TWICE DAILY.    Marland Kitchen albuterol (VENTOLIN HFA) 108 (90 Base) MCG/ACT inhaler Inhale 1 puff into the lungs every 6 (six) hours as needed for wheezing or shortness of breath. 18 g 0  . amLODipine (NORVASC) 10 MG tablet Take 1 tablet (10 mg total) by mouth daily. 90 tablet 3  . ARIPiprazole (ABILIFY) 10 MG tablet Take 1 tablet (10 mg total) by mouth daily.    Marland Kitchen aspirin EC 81 MG tablet Take 81 mg by mouth daily.    . budesonide-formoterol (SYMBICORT) 80-4.5 MCG/ACT inhaler Inhale 2 puffs into the lungs 2 (two) times daily. 10.2 g 0  . carvedilol (COREG) 25 MG tablet Take 2 tablets (50 mg total) by mouth 2 (two) times daily with a meal. 360 tablet 3  . clonazePAM (KLONOPIN) 0.5 MG tablet Take 0.5 mg by mouth 2 (two) times daily.    Marland Kitchen dextromethorphan-guaiFENesin (MUCINEX DM) 30-600 MG 12hr tablet Take 1 tablet by mouth 2 (two) times daily. 20 tablet 0  . gabapentin (NEURONTIN) 800 MG tablet Take 800 mg by mouth 3 (three) times daily.    . hydrALAZINE (APRESOLINE) 100 MG  tablet Take 1 tablet (100 mg total) by mouth 3 (three) times daily. 90 tablet 1  . insulin glargine (LANTUS) 100 UNIT/ML injection Inject 30 Units at bedtime into the skin.     Marland Kitchen insulin lispro (HUMALOG) 100 UNIT/ML injection Inject 10-16 Units into the skin 3 (three) times daily before meals.    . Ipratropium-Albuterol (COMBIVENT RESPIMAT) 20-100 MCG/ACT AERS respimat Inhale 1 puff into the lungs every 6 (six) hours as needed for wheezing or shortness of breath. 1 Inhaler 0  . linaclotide (LINZESS) 145 MCG CAPS capsule Take 1 capsule (145 mcg total) by mouth daily before breakfast. 30 capsule 5  . liraglutide (VICTOZA) 18 MG/3ML SOPN Inject 1.2 mg into the skin daily. 9 mL 3  . nitroGLYCERIN (NITROSTAT) 0.4 MG SL tablet Place 1 tablet (0.4 mg total) under  the tongue every 5 (five) minutes as needed for chest pain. 30 tablet 12  . ondansetron (ZOFRAN ODT) 8 MG disintegrating tablet Take 1 tablet (8 mg total) by mouth every 8 (eight) hours as needed for nausea or vomiting. 20 tablet 0  . pyridostigmine (MESTINON) 60 MG tablet Take 60 mg by mouth every 8 (eight) hours.    . sacubitril-valsartan (ENTRESTO) 97-103 MG Take 1 tablet by mouth 2 (two) times daily. 60 tablet 6  . traZODone (DESYREL) 100 MG tablet Take 2 tablets (200 mg total) by mouth at bedtime.    . triamcinolone ointment (KENALOG) 0.5 % Apply 1 application topically 2 (two) times daily. 30 g 0  . potassium chloride SA (KLOR-CON M20) 20 MEQ tablet Take 2 tablets (40 mEq total) by mouth daily. 180 tablet 3  . torsemide (DEMADEX) 20 MG tablet Take 2 tablets (40 mg total) by mouth 2 (two) times daily. 360 tablet 2   No current facility-administered medications for this visit.   Allergies:  Diclofenac, Tramadol, and Vicodin [hydrocodone-acetaminophen]   Social History: The patient  reports that she has never smoked. She has never used smokeless tobacco. She reports current alcohol use. She reports that she does not use drugs.   Family  History: The patient's family history includes ADD / ADHD in her son; Diabetes (age of onset: 76) in her mother; Heart attack in her brother; Heart disease in her father and mother; Heart disease (age of onset: 38) in her maternal grandmother; Hyperlipidemia in her paternal grandfather; Hypertension in her father, maternal uncle, and paternal grandfather.   ROS:  Please see the history of present illness. Otherwise, complete review of systems is positive for none.  All other systems are reviewed and negative.   Physical Exam: VS:  BP 138/88   Pulse 78   Ht 5\' 6"  (1.676 m)   Wt 236 lb (107 kg)   LMP 08/21/2019   SpO2 98%   BMI 38.09 kg/m , BMI Body mass index is 38.09 kg/m.  Wt Readings from Last 3 Encounters:  09/05/20 236 lb (107 kg)  09/04/20 236 lb (107 kg)  08/31/20 256 lb 6.4 oz (116.3 kg)    General: Patient appears comfortable at rest. Neck: Supple, no elevated JVP or carotid bruits, no thyromegaly. Lungs: Clear to auscultation, nonlabored breathing at rest. Cardiac: Regular rate and rhythm, no S3 or significant systolic murmur, no pericardial rub. Extremities: No pitting edema, distal pulses 2+. Skin: Warm and dry. Musculoskeletal: No kyphosis. Neuropsychiatric: Alert and oriented x3, affect grossly appropriate.  ECG:  EKG at [facility], ED on 08/30/2020 sinus rhythm rate of 92, PVC, right axis deviation, nonspecific repolarization abnormality, diffuse leads.  ST to pression, T flat/negative, anterior, lateral, inferior  Recent Labwork: 06/11/2020: Magnesium 1.9 08/29/2020: TSH 0.82 08/30/2020: ALT 19; AST 19; B Natriuretic Peptide 576.0; BUN 19; Creatinine, Ser 1.00; Hemoglobin 9.9; Platelets 256; Potassium 3.2; Sodium 139     Component Value Date/Time   CHOL 211 (H) 06/03/2018 0615   TRIG 223 (H) 06/03/2018 0615   HDL 39 (L) 06/03/2018 0615   CHOLHDL 5.4 06/03/2018 0615   VLDL 45 (H) 06/03/2018 0615   LDLCALC 127 (H) 06/03/2018 0615    Other Studies Reviewed  Today:  Echocardiogram 03/28/2020  1. Left ventricular ejection fraction, by estimation, is 30 to 35%. The left ventricle has moderately decreased function. The left ventricle demonstrates global hypokinesis. There is moderate left ventricular hypertrophy. Left ventricular diastolic parameters are consistent with Grade II diastolic  dysfunction (pseudonormalization). 2. Right ventricular systolic function is normal. The right ventricular size is normal. There is normal pulmonary artery systolic pressure. The estimated right ventricular systolic pressure is 40.3 mmHg. 3. Left atrial size was moderately dilated. 4. The mitral valve is grossly normal. Trivial mitral valve regurgitation. 5. The aortic valve is tricuspid. There is mild calcification of the aortic valve. Aortic valve regurgitation is not visualized. Mild aortic valve sclerosis is present, with no evidence of aortic valve stenosis. 6. The inferior vena cava is normal in size with greater than 50% respiratory variability, suggesting right atrial pressure of 3 mmHg.  Assessment and Plan:  1. Chronic combined systolic (congestive) and diastolic (congestive) heart failure ([place])   2. NICM (nonischemic cardiomyopathy) ([place])   3. Essential hypertension    1. Chronic combined systolic (congestive) and diastolic (congestive) heart failure [facility]) Recent ED presentation for fluid overload.  According to ED provider she had not taken her torsemide for 1 week.  She received IV diuresis with loss of 3 L of fluid.  She was given a dose of torsemide to take at home.  She states pharmacy would not fill her torsemide.  We will send a new prescription in for torsemide 40 mg p.o. twice daily.  Continue Entresto at 97/103 p.o. twice daily.  Continue potassium 40 mEq daily.  Continue carvedilol 50 mg p.o. twice daily.  Advised her to weigh daily and may take an extra 40 mg torsemide if needed for weight gain of 3 pounds in 24 hours or 5 pounds in 1 week.   We will get a follow-up BMP and magnesium in 1 week.   2. NICM (nonischemic cardiomyopathy) ([place]) Last echocardiogram 03/28/2020 with EF of 30 to 35%.  Global hypokinesis.  Moderate LVH.  G2 DD.  LA moderately dilated.  Trivial MR. Please get a repeat echocardiogram to reevaluate LV function, diastolic function, valvular function.  3. Essential hypertension Blood pressure appears to be under much better control although elevated.  Blood pressure today 138/88.  Continue amlodipine 10 mg daily.  Continue hydralazine 100 mg p.o. 3 times daily.  Continue carvedilol 50 mg p.o. twice daily.    Medication Adjustments/Labs and Tests Ordered: Current medicines are reviewed at length with the patient today.  Concerns regarding medicines are outlined above.   Disposition: Follow-up with Dr. Harl Bowie or APP 3 months  Signed, Levell July, NP 09/05/2020 10:25 AM    [facility] [place], [place], [place] [address] Phone: [phone]; Fax: [phone]

## 2020-09-04 NOTE — Telephone Encounter (Signed)
I spoke with patient. She reports that she ran out of torsemide for 7 days and was told by pcp that she needed an apt and they were awaiting labs before they would refill her torsemide. She states she didn't think to call [facility]. She went from 240 lbs to 259 lbs in that time and went to [facility] ED on 08/30/20.She was given Lasix 80 mg BID and she then self increased to 120 mg BID. Her weight today is 236 lbs which is 6 lbs heavier since Monday (3 days ago) which was 230 lbs. I advised her about self dosing especially with diuretics.    She has appointnment tomorrow in the [place] office

## 2020-09-04 NOTE — Telephone Encounter (Signed)
Patient is a Therapist, music Patient. Will route to [place].

## 2020-09-04 NOTE — Progress Notes (Signed)
Endocrinology Consult Note       09/04/2020, 2:16 PM   Subjective:    Patient ID: Chelsey Santiago, female    DOB: 08/13/1975.  Chelsey Santiago is being seen in consultation for management of currently uncontrolled symptomatic diabetes requested by  Rosita Fire, MD.   Past Medical History:  Diagnosis Date  . Anemia    H&H of 10.6/33 and 07/2008 and 11.9/35 and 09/2010  . Anxiety   . Chronic combined systolic and diastolic CHF (congestive heart failure) ([place])   . Depression with anxiety   . Diabetes mellitus without complication ([place])   . Hypertension   . Hypertensive heart disease 2009   Pulmonary edema postpartum; mild to moderate mitral regurgitation when hospitalized for CHF in 2009; Echocardiogram in 12/2009-no MR and normal EF; normal CXR in 09/2010  . Migraine headache   . Miscarriage 03/19/2013  . Obesity 04/16/2009  . Osteoarthritis, knee 03/29/2011  . Preeclampsia   . Pulmonary edema   . Sleep apnea   . Threatened abortion in early pregnancy 03/15/2013    Past Surgical History:  Procedure Laterality Date  . BREAST REDUCTION SURGERY  2002  . CARDIAC CATHETERIZATION N/A 12/22/2015   Procedure: Left Heart Cath and Coronary Angiography;  Surgeon: Peter M Martinique, MD;  Location: [facility];  Service: Cardiovascular;  Laterality: N/A;  . CESAREAN SECTION N/A 04/09/2014   Procedure: CESAREAN SECTION;  Surgeon: Mora Bellman, MD;  Location: [facility];  Service: Obstetrics;  Laterality: N/A;  . CHOLECYSTECTOMY    . HYSTERECTOMY ABDOMINAL WITH SALPINGECTOMY N/A 09/26/2019   Procedure: HYSTERECTOMY ABDOMINAL WITH SALPINGECTOMY;  Surgeon: Florian Buff, MD;  Location: [facility];  Service: Gynecology;  Laterality: N/A;  . LIPOMA EXCISION Right 09/26/2019   Procedure: EXCISION LIPOMA RIGHT VULVAR;  Surgeon: Florian Buff, MD;  Location: [facility];  Service: Gynecology;  Laterality: Right;    Social History    Socioeconomic History  . Marital status: Married    Spouse name: Not on file  . Number of children: 6  . Years of education: Not on file  . Highest education level: Not on file  Occupational History  . Occupation: unemployed    Fish farm manager: UNEMPLOYED  Tobacco Use  . Smoking status: Never Smoker  . Smokeless tobacco: Never Used  Vaping Use  . Vaping Use: Never used  Substance and Sexual Activity  . Alcohol use: Yes    Comment: occ  . Drug use: No  . Sexual activity: Yes    Birth control/protection: Surgical    Comment: hyst  Other Topics Concern  . Not on file  Social History Narrative   Lives in [place]   Engaged/boyfriend (father to youngest chid)   4 children: daughter (10 as of 2013), sons (15, 40, 52 as of 2013)   Religion: christian   Social Determinants of Radio broadcast assistant Strain: Not on file  Food Insecurity: Not on file  Transportation Needs: Not on file  Physical Activity: Not on file  Stress: Not on file  Social Connections: Not on file    Family History  Problem Relation Age of Onset  . Diabetes Mother 29  . Heart disease Mother   .  Hyperlipidemia Paternal Grandfather   . Hypertension Paternal Grandfather   . Heart disease Father   . Hypertension Father   . Heart disease Maternal Grandmother 60  . ADD / ADHD Son   . Hypertension Maternal Uncle   . Heart attack Brother   . Sudden death Neg Hx   . Colon cancer Neg Hx   . Celiac disease Neg Hx   . Inflammatory bowel disease Neg Hx     Outpatient Encounter Medications as of 09/04/2020  Medication Sig  . ACCU-CHEK GUIDE test strip USE AS DIRECTED TO TESTCBLOOD SUGAR TWICE DAILY.  Marland Kitchen albuterol (VENTOLIN HFA) 108 (90 Base) MCG/ACT inhaler Inhale 1 puff into the lungs every 6 (six) hours as needed for wheezing or shortness of breath.  Marland Kitchen amLODipine (NORVASC) 10 MG tablet Take 1 tablet (10 mg total) by mouth daily.  . ARIPiprazole (ABILIFY) 10 MG tablet Take 1 tablet (10 mg total) by mouth daily.   Marland Kitchen aspirin EC 81 MG tablet Take 81 mg by mouth daily.  . budesonide-formoterol (SYMBICORT) 80-4.5 MCG/ACT inhaler Inhale 2 puffs into the lungs 2 (two) times daily.  . carvedilol (COREG) 25 MG tablet Take 2 tablets (50 mg total) by mouth 2 (two) times daily with a meal.  . clonazePAM (KLONOPIN) 0.5 MG tablet Take 0.5 mg by mouth 2 (two) times daily.  Marland Kitchen dextromethorphan-guaiFENesin (MUCINEX DM) 30-600 MG 12hr tablet Take 1 tablet by mouth 2 (two) times daily.  Marland Kitchen gabapentin (NEURONTIN) 800 MG tablet Take 800 mg by mouth 3 (three) times daily.  . hydrALAZINE (APRESOLINE) 100 MG tablet Take 1 tablet (100 mg total) by mouth 3 (three) times daily.  . insulin glargine (LANTUS) 100 UNIT/ML injection Inject 30 Units at bedtime into the skin.   Marland Kitchen insulin lispro (HUMALOG) 100 UNIT/ML injection Inject 10-16 Units into the skin 3 (three) times daily before meals.  . Ipratropium-Albuterol (COMBIVENT RESPIMAT) 20-100 MCG/ACT AERS respimat Inhale 1 puff into the lungs every 6 (six) hours as needed for wheezing or shortness of breath.  . linaclotide (LINZESS) 145 MCG CAPS capsule Take 1 capsule (145 mcg total) by mouth daily before breakfast.  . liraglutide (VICTOZA) 18 MG/3ML SOPN Inject 1.2 mg into the skin daily.  . nitroGLYCERIN (NITROSTAT) 0.4 MG SL tablet Place 1 tablet (0.4 mg total) under the tongue every 5 (five) minutes as needed for chest pain.  Marland Kitchen ondansetron (ZOFRAN ODT) 8 MG disintegrating tablet Take 1 tablet (8 mg total) by mouth every 8 (eight) hours as needed for nausea or vomiting.  . potassium chloride SA (KLOR-CON M20) 20 MEQ tablet Take 2 tablets (40 mEq total) by mouth daily.  Marland Kitchen pyridostigmine (MESTINON) 60 MG tablet Take 60 mg by mouth every 8 (eight) hours.  . sacubitril-valsartan (ENTRESTO) 97-103 MG Take 1 tablet by mouth 2 (two) times daily.  Marland Kitchen torsemide (DEMADEX) 20 MG tablet Take 4 tablets (80 mg total) by mouth daily.  . traZODone (DESYREL) 100 MG tablet Take 2 tablets (200 mg total)  by mouth at bedtime.  . triamcinolone ointment (KENALOG) 0.5 % Apply 1 application topically 2 (two) times daily.  . [DISCONTINUED] gabapentin (NEURONTIN) 600 MG tablet Take 1 tablet (600 mg total) by mouth 3 (three) times daily.  . [DISCONTINUED] magic mouthwash w/lidocaine SOLN Take 5 mLs by mouth 3 (three) times daily as needed for mouth pain. Swish and spit do not swallow  . [DISCONTINUED] promethazine-codeine (PHENERGAN WITH CODEINE) 6.25-10 MG/5ML syrup Take 5 mLs by mouth every 6 (six) hours as  needed for cough.   No facility-administered encounter medications on file as of 09/04/2020.    ALLERGIES: Allergies  Allergen Reactions  . Diclofenac Swelling    AND POSSIBLE SYNCOPE; tolerates ibuprofen per pt  . Tramadol Nausea And Vomiting and Nausea Only    Itching (12/21); tolerates ibuprofen per pt  . Vicodin [Hydrocodone-Acetaminophen] Itching and Nausea Only    VACCINATION STATUS: Immunization History  Administered Date(s) Administered  . Influenza Whole 04/23/2012  . Influenza,inj,Quad PF,6+ Mos 03/14/2014  . Rho (D) Immune Globulin 08/07/2012  . Tdap 10/11/2012, 11/02/2019    Diabetes She presents for her initial diabetic visit. She has type 2 diabetes mellitus. Onset time: Diagnosed at approx age of 24. Her disease course has been fluctuating. There are no hypoglycemic associated symptoms. Associated symptoms include fatigue, polydipsia and polyuria. There are no hypoglycemic complications. Symptoms are stable. Diabetic complications include heart disease, nephropathy and peripheral neuropathy. Risk factors for coronary artery disease include diabetes mellitus, dyslipidemia, family history, obesity, hypertension and sedentary lifestyle. Current diabetic treatment includes intensive insulin program. She is compliant with treatment most of the time. Her weight is fluctuating dramatically (CHF). She is following a generally unhealthy diet. When asked about meal planning, she  reported none. She has not had a previous visit with a dietitian. She rarely participates in exercise. Her overall blood glucose range is >200 mg/dl. (She presents today for her consultation, with no meter or logs to review.  Her most recent A1c was 9.8% on 4/15.  She only monitors glucose 1-2 times daily.  She admits to drinking sugary beverages including soda and koolaid (on fluid restriction for CHF) and only eats 1-2 meals per day with occasional snacks.  She does not engage in routine physical exercise.  She denies any significant hypoglycemia.) An ACE inhibitor/angiotensin II receptor blocker is being taken. She does not see a podiatrist.Eye exam is current.  Hypertension This is a chronic problem. The current episode started more than 1 year ago. The problem has been gradually improving since onset. The problem is uncontrolled. Associated symptoms include malaise/fatigue, peripheral edema and shortness of breath. There are no associated agents to hypertension. Risk factors for coronary artery disease include diabetes mellitus, obesity, sedentary lifestyle and family history. Past treatments include ACE inhibitors, beta blockers, diuretics and central alpha agonists. The current treatment provides mild improvement. Compliance problems include exercise and diet.  Hypertensive end-organ damage includes kidney disease and CAD/MI. Identifiable causes of hypertension include chronic renal disease and sleep apnea.     Review of systems  Constitutional: + drastically fluctuating body weight r/t fluid retention,  current Body mass index is 38.09 kg/m. , + fatigue, no subjective hyperthermia, no subjective hypothermia Eyes: no blurry vision, no xerophthalmia ENT: no sore throat, no nodules palpated in throat, no dysphagia/odynophagia, no hoarseness Cardiovascular: no chest pain, + intermittent shortness of breath (r/t CHF), no palpitations, + leg swelling Respiratory: no cough, + shortness of  breath Gastrointestinal: no nausea/vomiting/diarrhea Musculoskeletal: no muscle/joint aches Skin: no rashes, no hyperemia Neurological: no tremors, + numbness/tingling to BLE (on Gabapentin), no dizziness Psychiatric: no depression, no anxiety  Objective:     BP (!) 156/104 (BP Location: Left Arm, Patient Position: Sitting)   Pulse 91   Ht 5\' 6"  (1.676 m)   Wt 236 lb (107 kg)   LMP 08/21/2019   BMI 38.09 kg/m   Wt Readings from Last 3 Encounters:  09/04/20 236 lb (107 kg)  08/31/20 256 lb 6.4 oz (116.3 kg)  08/25/20  230 lb (104.3 kg)     BP Readings from Last 3 Encounters:  09/04/20 (!) 156/104  08/31/20 (!) 157/95  08/26/20 (!) 189/99     Physical Exam- Limited  Constitutional:  Body mass index is 38.09 kg/m. , not in acute distress, normal state of mind Eyes:  EOMI, no exophthalmos Neck: Supple Cardiovascular: RRR, no murmers, rubs, or gallops, + mild pitting edema to BLE Respiratory: Adequate breathing efforts, no crackles, rales, rhonchi, or wheezing Musculoskeletal: no gross deformities, strength intact in all four extremities, no gross restriction of joint movements Skin:  no rashes, no hyperemia Neurological: no tremor with outstretched hands    CMP ( most recent) CMP     Component Value Date/Time   NA 139 08/30/2020 2246   K 3.2 (L) 08/30/2020 2246   CL 103 08/30/2020 2246   CO2 27 08/30/2020 2246   GLUCOSE 110 (H) 08/30/2020 2246   BUN 19 08/30/2020 2246   BUN 20 08/29/2020 0000   CREATININE 1.00 08/30/2020 2246   CREATININE 1.00 03/11/2016 1157   CALCIUM 8.8 (L) 08/30/2020 2246   PROT 7.4 08/30/2020 2246   ALBUMIN 3.3 (L) 08/30/2020 2246   AST 19 08/30/2020 2246   ALT 19 08/30/2020 2246   ALKPHOS 70 08/30/2020 2246   BILITOT 0.4 08/30/2020 2246   GFRNONAA >60 08/30/2020 2246   GFRAA 82 08/29/2020 0000     Diabetic Labs (most recent): Lab Results  Component Value Date   HGBA1C 9.8 08/29/2020   HGBA1C 10.7 (H) 06/10/2020   HGBA1C 10.6  (H) 03/28/2020     Lipid Panel ( most recent) Lipid Panel     Component Value Date/Time   CHOL 211 (H) 06/03/2018 0615   TRIG 223 (H) 06/03/2018 0615   HDL 39 (L) 06/03/2018 0615   CHOLHDL 5.4 06/03/2018 0615   VLDL 45 (H) 06/03/2018 0615   LDLCALC 127 (H) 06/03/2018 0615      Lab Results  Component Value Date   TSH 0.82 08/29/2020   TSH 0.354 03/28/2020   TSH 0.719 06/05/2019   TSH 0.534 03/25/2017   TSH 1.434 08/28/2016   TSH 0.835 04/03/2016   TSH 0.48 03/11/2016   TSH 1.110 04/22/2014   TSH <0.008 (L) 10/18/2013   FREET4 1.27 (H) 06/05/2019           Assessment & Plan:   1) Uncontrolled type 2 diabetes mellitus with hyperglycemia ([place])  She presents today for her consultation, with no meter or logs to review.  Her most recent A1c was 9.8% on 4/15.  She only monitors glucose 1-2 times daily.  She admits to drinking sugary beverages including soda and koolaid (on fluid restriction for CHF) and only eats 1-2 meals per day with occasional snacks.  She does not engage in routine physical exercise.  She denies any significant hypoglycemia.  - Chelsey Santiago has currently uncontrolled symptomatic type 2 DM since 45 years of age, with most recent A1c of 9.8 %.   -Recent labs reviewed.  - I had a long discussion with her about the progressive nature of diabetes and the pathology behind its complications. -her diabetes is complicated by CAD with MI, CKD, neuropathy and she remains at a high risk for more acute and chronic complications which include CAD, CVA, CKD, retinopathy, and neuropathy. These are all discussed in detail with her.  - I have counseled her on diet  and weight management by adopting a carbohydrate restricted/protein rich diet. Patient is encouraged to switch to unprocessed  or minimally processed complex starch and increased protein intake (animal or plant source), fruits, and vegetables. -  she is advised to stick to a routine mealtimes to eat 3 meals a  day and avoid unnecessary snacks (to snack only to correct hypoglycemia).   - she acknowledges that there is a room for improvement in her food and drink choices. - Suggestion is made for her to avoid simple carbohydrates  from her diet including Cakes, Sweet Desserts, Ice Cream, Soda (diet and regular), Sweet Tea, Candies, Chips, Cookies, Store Bought Juices, Alcohol in Excess of  1-2 drinks a day, Artificial Sweeteners, Coffee Creamer, and "Sugar-free" Products. This will help patient to have more stable blood glucose profile and potentially avoid unintended weight gain.  - I have approached her with the following individualized plan to manage  her diabetes and patient agrees:   -She is advised to continue current dose of Lantus at 30 units SQ nightly and change her Humalog to 10-16 units TID with meals if glucose is above 90 and she is eating.  Specific instructions on how to titrate insulin dose based on glucose readings given to patient in writing.  She demonstrated her understanding on how to use the SSI chart properly to dose her insulin with me today.  -she is encouraged to start monitoring glucose 4 times daily, before meals and before bed, to log their readings on the clinic sheets provided, and bring them to review at follow up appointment in 2 weeks.  She could benefit from CGM device.  Will discuss at follow up visit in 2 weeks.  - she is warned not to take insulin without proper monitoring per orders. - Adjustment parameters are given to her for hypo and hyperglycemia in writing. - she is encouraged to call clinic for blood glucose levels less than 70 or above 300 mg /dl.  - I discussed and initiated trial of Victoza 1.2 mg SQ daily which may help with weight and glucose control.  - Specific targets for  A1c;  LDL, HDL,  and Triglycerides were discussed with the patient.  2) Blood Pressure /Hypertension:  her blood pressure is not controlled to target.   she is advised to continue  her current medications including Entresto 97-103 mg po daily, Demadex 80 mg po daily, Hydralazine 100 mg po TID, Coreg 50 mg po BID and Norvasc 10 mg p.o. daily with breakfast.  She has follow up with her cardiologist soon to discuss fluid retention and BP.  3) Lipids/Hyperlipidemia:    There is no recent lipid panel available to review, nor is she on any lipid lowering medication.  Will check lipid panel on subsequent visits.  4)  Weight/Diet:  her Body mass index is 38.09 kg/m.  -  clearly complicating her diabetes care.   she is a candidate for weight loss. I discussed with her the fact that loss of 5 - 10% of her  current body weight will have the most impact on her diabetes management.  Exercise, and detailed carbohydrates information provided  -  detailed on discharge instructions.  5) Chronic Care/Health Maintenance: -she is on ACEI/ARB and is encouraged to initiate and continue to follow up with Ophthalmology, Dentist, Podiatrist at least yearly or according to recommendations, and advised to stay away from smoking. I have recommended yearly flu vaccine and pneumonia vaccine at least every 5 years; moderate intensity exercise for up to 150 minutes weekly; and sleep for at least 7 hours a day.  - she  is advised to maintain close follow up with Rosita Fire, MD for primary care needs, as well as her other providers for optimal and coordinated care.   - Time spent in this patient care: 60 min, of which > 50% was spent in counseling her about her diabetes and the rest reviewing her blood glucose logs, discussing her hypoglycemia and hyperglycemia episodes, reviewing her current and previous labs/studies (including abstraction from other facilities) and medications doses and developing a long term treatment plan based on the latest standards of care/guidelines; and documenting her care.    Please refer to Patient Instructions for Blood Glucose Monitoring and Insulin/Medications Dosing Guide"  in media tab for additional information. Please also refer to "Patient Self Inventory" in the Media tab for reviewed elements of pertinent patient history.  [name] participated in the discussions, expressed understanding, and voiced agreement with the above plans.  All questions were answered to her satisfaction. she is encouraged to contact clinic should she have any questions or concerns prior to her return visit.   Follow up plan: - Return in about 2 weeks (around 09/18/2020) for Diabetes follow up, Bring glucometer and logs, ABI next visit.  Rayetta Pigg, [facility] [address] [place], [place] [address] Phone: [phone] Fax: [phone]  09/04/2020, 2:16 PM

## 2020-09-04 NOTE — Telephone Encounter (Signed)
Patient called stating  that she continues to retain fluid . States that her BP 156/104 she feels "funny"appointment has been scheduled for tomorrow with A.Quinn. [phone]

## 2020-09-04 NOTE — Telephone Encounter (Signed)
Pt called and said that victoza is needing a PA

## 2020-09-04 NOTE — Telephone Encounter (Signed)
Pursue?

## 2020-09-04 NOTE — Telephone Encounter (Signed)
Sent in

## 2020-09-05 ENCOUNTER — Encounter: Payer: Self-pay | Admitting: Family Medicine

## 2020-09-05 ENCOUNTER — Ambulatory Visit (INDEPENDENT_AMBULATORY_CARE_PROVIDER_SITE_OTHER): Payer: Medicaid Other | Admitting: Family Medicine

## 2020-09-05 VITALS — BP 138/88 | HR 78 | Ht 66.0 in | Wt 236.0 lb

## 2020-09-05 DIAGNOSIS — G4733 Obstructive sleep apnea (adult) (pediatric): Secondary | ICD-10-CM

## 2020-09-05 DIAGNOSIS — I5042 Chronic combined systolic (congestive) and diastolic (congestive) heart failure: Secondary | ICD-10-CM | POA: Diagnosis not present

## 2020-09-05 DIAGNOSIS — I428 Other cardiomyopathies: Secondary | ICD-10-CM | POA: Diagnosis not present

## 2020-09-05 DIAGNOSIS — I10 Essential (primary) hypertension: Secondary | ICD-10-CM | POA: Diagnosis not present

## 2020-09-05 MED ORDER — TORSEMIDE 20 MG PO TABS: 40.0000 mg | ORAL_TABLET | Freq: Two times a day (BID) | ORAL | 2 refills | Status: DC

## 2020-09-05 NOTE — Patient Instructions (Addendum)
Medication Instructions:   Continue Torsemide at 40mg  twice a day.  Continue all other medications.    Labwork: BMET, Mg - orders given today.  Testing/Procedures: Your physician has requested that you have an echocardiogram. Echocardiography is a painless test that uses sound waves to create images of your heart. It provides your doctor with information about the size and shape of your heart and how well your heart's chambers and valves are working. This procedure takes approximately one hour. There are no restrictions for this procedure - DUE IN THE NEXT 5-6 WEEKS   Follow-Up:  Office will contact with results via phone or letter.    3 months   Any Other Special Instructions Will Be Listed Below (If Applicable). Call the office in 1 week with update on weights & blood pressure.    If you need a refill on your cardiac medications before your next appointment, please call your pharmacy.

## 2020-09-09 ENCOUNTER — Ambulatory Visit: Payer: Medicaid Other | Admitting: Family Medicine

## 2020-09-10 ENCOUNTER — Other Ambulatory Visit: Payer: Self-pay

## 2020-09-10 ENCOUNTER — Emergency Department (HOSPITAL_COMMUNITY): Payer: Medicaid Other

## 2020-09-10 ENCOUNTER — Encounter (HOSPITAL_COMMUNITY): Payer: Self-pay

## 2020-09-10 ENCOUNTER — Inpatient Hospital Stay (HOSPITAL_COMMUNITY)
Admission: EM | Admit: 2020-09-10 | Discharge: 2020-09-13 | DRG: 291 | Disposition: A | Discharge status: Home / Self Care | Payer: Medicaid Other | Attending: Family Medicine | Admitting: Family Medicine

## 2020-09-10 DIAGNOSIS — Z885 Allergy status to narcotic agent status: Secondary | ICD-10-CM

## 2020-09-10 DIAGNOSIS — Z7982 Long term (current) use of aspirin: Secondary | ICD-10-CM | POA: Diagnosis not present

## 2020-09-10 DIAGNOSIS — I161 Hypertensive emergency: Secondary | ICD-10-CM | POA: Diagnosis present

## 2020-09-10 DIAGNOSIS — E1165 Type 2 diabetes mellitus with hyperglycemia: Secondary | ICD-10-CM | POA: Diagnosis present

## 2020-09-10 DIAGNOSIS — I447 Left bundle-branch block, unspecified: Secondary | ICD-10-CM | POA: Diagnosis present

## 2020-09-10 DIAGNOSIS — Z9114 Patient's other noncompliance with medication regimen: Secondary | ICD-10-CM | POA: Diagnosis not present

## 2020-09-10 DIAGNOSIS — Z888 Allergy status to other drugs, medicaments and biological substances status: Secondary | ICD-10-CM | POA: Diagnosis not present

## 2020-09-10 DIAGNOSIS — I5041 Acute combined systolic (congestive) and diastolic (congestive) heart failure: Secondary | ICD-10-CM | POA: Diagnosis not present

## 2020-09-10 DIAGNOSIS — I160 Hypertensive urgency: Secondary | ICD-10-CM | POA: Diagnosis not present

## 2020-09-10 DIAGNOSIS — Z8616 Personal history of COVID-19: Secondary | ICD-10-CM | POA: Diagnosis not present

## 2020-09-10 DIAGNOSIS — I110 Hypertensive heart disease with heart failure: Secondary | ICD-10-CM | POA: Diagnosis not present

## 2020-09-10 DIAGNOSIS — Z6838 Body mass index (BMI) 38.0-38.9, adult: Secondary | ICD-10-CM

## 2020-09-10 DIAGNOSIS — Z833 Family history of diabetes mellitus: Secondary | ICD-10-CM | POA: Diagnosis not present

## 2020-09-10 DIAGNOSIS — Z8249 Family history of ischemic heart disease and other diseases of the circulatory system: Secondary | ICD-10-CM

## 2020-09-10 DIAGNOSIS — Z28310 Unvaccinated for covid-19: Secondary | ICD-10-CM

## 2020-09-10 DIAGNOSIS — E876 Hypokalemia: Secondary | ICD-10-CM | POA: Diagnosis present

## 2020-09-10 DIAGNOSIS — Z83438 Family history of other disorder of lipoprotein metabolism and other lipidemia: Secondary | ICD-10-CM | POA: Diagnosis not present

## 2020-09-10 DIAGNOSIS — Z20822 Contact with and (suspected) exposure to covid-19: Secondary | ICD-10-CM | POA: Diagnosis present

## 2020-09-10 DIAGNOSIS — G4733 Obstructive sleep apnea (adult) (pediatric): Secondary | ICD-10-CM | POA: Diagnosis present

## 2020-09-10 DIAGNOSIS — Z289 Immunization not carried out for unspecified reason: Secondary | ICD-10-CM | POA: Diagnosis not present

## 2020-09-10 DIAGNOSIS — I5043 Acute on chronic combined systolic (congestive) and diastolic (congestive) heart failure: Secondary | ICD-10-CM | POA: Diagnosis present

## 2020-09-10 DIAGNOSIS — E1169 Type 2 diabetes mellitus with other specified complication: Secondary | ICD-10-CM | POA: Diagnosis not present

## 2020-09-10 DIAGNOSIS — Z90710 Acquired absence of both cervix and uterus: Secondary | ICD-10-CM

## 2020-09-10 DIAGNOSIS — E118 Type 2 diabetes mellitus with unspecified complications: Secondary | ICD-10-CM | POA: Diagnosis present

## 2020-09-10 DIAGNOSIS — I493 Ventricular premature depolarization: Secondary | ICD-10-CM | POA: Diagnosis present

## 2020-09-10 DIAGNOSIS — I509 Heart failure, unspecified: Secondary | ICD-10-CM | POA: Diagnosis present

## 2020-09-10 DIAGNOSIS — I5021 Acute systolic (congestive) heart failure: Secondary | ICD-10-CM | POA: Diagnosis not present

## 2020-09-10 DIAGNOSIS — E669 Obesity, unspecified: Secondary | ICD-10-CM | POA: Diagnosis present

## 2020-09-10 DIAGNOSIS — Z79899 Other long term (current) drug therapy: Secondary | ICD-10-CM

## 2020-09-10 DIAGNOSIS — Z9049 Acquired absence of other specified parts of digestive tract: Secondary | ICD-10-CM

## 2020-09-10 DIAGNOSIS — Z794 Long term (current) use of insulin: Secondary | ICD-10-CM | POA: Diagnosis not present

## 2020-09-10 DIAGNOSIS — I428 Other cardiomyopathies: Secondary | ICD-10-CM | POA: Diagnosis present

## 2020-09-10 DIAGNOSIS — Z7951 Long term (current) use of inhaled steroids: Secondary | ICD-10-CM | POA: Diagnosis not present

## 2020-09-10 LAB — CBC
HCT: 32.9 % — ABNORMAL LOW (ref 36.0–46.0)
Hemoglobin: 9.9 g/dL — ABNORMAL LOW (ref 12.0–15.0)
MCH: 22.8 pg — ABNORMAL LOW (ref 26.0–34.0)
MCHC: 30.1 g/dL (ref 30.0–36.0)
MCV: 75.6 fL — ABNORMAL LOW (ref 80.0–100.0)
Platelets: 250 10*3/uL (ref 150–400)
RBC: 4.35 MIL/uL (ref 3.87–5.11)
RDW: 14.9 % (ref 11.5–15.5)
WBC: 9.9 10*3/uL (ref 4.0–10.5)
nRBC: 0 % (ref 0.0–0.2)

## 2020-09-10 LAB — BRAIN NATRIURETIC PEPTIDE: B Natriuretic Peptide: 2283 pg/mL — ABNORMAL HIGH (ref 0.0–100.0)

## 2020-09-10 LAB — TROPONIN I (HIGH SENSITIVITY)
Troponin I (High Sensitivity): 14 ng/L (ref <18)
Troponin I (High Sensitivity): 16 ng/L (ref <18)

## 2020-09-10 LAB — BASIC METABOLIC PANEL
Anion gap: 9 (ref 5–15)
BUN: 17 mg/dL (ref 6–20)
CO2: 28 mmol/L (ref 22–32)
Calcium: 8.7 mg/dL — ABNORMAL LOW (ref 8.9–10.3)
Chloride: 100 mmol/L (ref 98–111)
Creatinine, Ser: 0.96 mg/dL (ref 0.44–1.00)
GFR, Estimated: >60 mL/min (ref >60)
Glucose, Bld: 91 mg/dL (ref 70–99)
Potassium: 3.3 mmol/L — ABNORMAL LOW (ref 3.5–5.1)
Sodium: 137 mmol/L (ref 135–145)

## 2020-09-10 LAB — RESP PANEL BY RT-PCR (FLU A&B, COVID) ARPGX2
Influenza A by PCR: NEGATIVE
Influenza B by PCR: NEGATIVE
SARS Coronavirus 2 by RT PCR: NEGATIVE

## 2020-09-10 LAB — POC URINE PREG, ED: Preg Test, Ur: NEGATIVE

## 2020-09-10 MED ORDER — LABETALOL HCL 5 MG/ML IV SOLN: 20.0000 mg | Freq: Once | INTRAVENOUS | Status: DC

## 2020-09-10 MED ORDER — FUROSEMIDE 10 MG/ML IJ SOLN
60.0000 mg | Freq: Once | INTRAMUSCULAR | Status: AC
  Administered 2020-09-10: 60 mg via INTRAVENOUS
  Filled 2020-09-10: qty 6

## 2020-09-10 MED ORDER — NITROGLYCERIN IN D5W 200-5 MCG/ML-% IV SOLN
0.0000 ug/min | INTRAVENOUS | Status: DC
  Administered 2020-09-10: 5 ug/min via INTRAVENOUS
  Filled 2020-09-10: qty 250

## 2020-09-10 MED ORDER — NITROGLYCERIN 0.4 MG SL SUBL
0.4000 mg | SUBLINGUAL_TABLET | SUBLINGUAL | Status: DC | PRN
  Administered 2020-09-10: 0.4 mg via SUBLINGUAL
  Filled 2020-09-10: qty 1

## 2020-09-10 MED ORDER — HYDRALAZINE HCL 20 MG/ML IJ SOLN
10.0000 mg | Freq: Once | INTRAMUSCULAR | Status: AC
  Administered 2020-09-10: 10 mg via INTRAVENOUS
  Filled 2020-09-10: qty 1

## 2020-09-10 MED ORDER — LABETALOL HCL 5 MG/ML IV SOLN
20.0000 mg | Freq: Once | INTRAVENOUS | Status: AC
  Administered 2020-09-10: 20 mg via INTRAVENOUS
  Filled 2020-09-10: qty 4

## 2020-09-10 NOTE — ED Triage Notes (Addendum)
EMS reports was called out for chest pain.  Reports pt has had chest pressure x 3 days and cough x 2 days.  Reports history of chf.    Pt also reports swelling to abd and sob.  Pt says saw her cardiologist last Friday and they started her on Torsemide.  EMS says bp 170/100 o2 sat 97-98% on room air.  Denies fever.  EMS gave 2 nitro and 4 baby asa pta.

## 2020-09-10 NOTE — ED Notes (Signed)
Pt provided with crackers and ginger ale. No other requests at this time. Call bell within reach, bed in low position. Will continue to monitor.

## 2020-09-10 NOTE — ED Provider Notes (Signed)
[facility] EMERGENCY DEPARTMENT Provider Note   CSN: [id number] Arrival date & time: 09/10/20  1641     History Chief Complaint  Patient presents with  . Chest Pain    Chelsey Santiago is a 45 y.o. female with a history significant for chronic CHF with ejection fraction of 30 to 35% type 2 diabetes, hypertension presenting for evaluation of chest pressure and increasing shortness of breath, worse with exertion also endorses orthopnea.  She also reports a 9 pound plus weight gain despite having her torsemide increased from 40 mg daily to twice daily 6 days ago, prior to that had been out of her diuretic for a week.  Her shortness of breath has been slowly progressive over the past week.  She reports waking this morning with right-sided to midsternal chest pressure which has been constant and has been associated with diaphoresis.  She denies cough or fever, denies nausea, vomiting or abdominal pain, also denies palpitations or lightheadedness.  She has abdominal distention which is generally where she "carries her excess fluid".  She denies peripheral edema.  She was given 4 baby aspirin in route by EMS and also received 2 nitroglycerin tablets which did not improve her symptoms.  HPI     Past Medical History:  Diagnosis Date  . Anemia    H&H of 10.6/33 and 07/2008 and 11.9/35 and 09/2010  . Anxiety   . Chronic combined systolic and diastolic CHF (congestive heart failure) ([place])   . Depression with anxiety   . Diabetes mellitus without complication ([place])   . Hypertension   . Hypertensive heart disease 2009   Pulmonary edema postpartum; mild to moderate mitral regurgitation when hospitalized for CHF in 2009; Echocardiogram in 12/2009-no MR and normal EF; normal CXR in 09/2010  . Migraine headache   . Miscarriage 03/19/2013  . Obesity 04/16/2009  . Osteoarthritis, knee 03/29/2011  . Preeclampsia   . Pulmonary edema   . Sleep apnea   . Threatened abortion in early pregnancy 03/15/2013     Patient Active Problem List   Diagnosis Date Noted  . Pneumonia due to COVID-19 virus 06/09/2020  . Acute on chronic respiratory failure with hypoxia ([place]) 06/09/2020  . Obstructive sleep apnea 03/29/2020  . Flash pulmonary edema ([place]) 03/27/2020  . Back pain 12/19/2019  . Vaginal discharge 12/19/2019  . Vaginal itching 12/19/2019  . BV (bacterial vaginosis) 12/19/2019  . Chronic hypertension 12/19/2019  . Fibroids 09/26/2019  . S/P hysterectomy 09/26/2019  . Acute blood loss anemia 09/26/2019  . Abdominal pain, epigastric 03/14/2019  . Dysphagia 03/14/2019  . Gastroesophageal reflux disease 03/14/2019  . Class 2 obesity   . Acute exacerbation of CHF (congestive heart failure) ([place]) 10/26/2018  . Chronic combined systolic and diastolic CHF (congestive heart failure) ([place]) 06/02/2018  . Uncontrolled type 2 diabetes mellitus with hyperglycemia ([place]) 06/02/2018  . Influenza B 05/13/2018  . Hypomagnesemia 05/12/2018  . Headache 05/12/2018  . Upper respiratory tract infection   . HCAP (healthcare-associated pneumonia)   . Constipation 10/17/2017  . Bad headache   . CHF exacerbation ([place]) 09/29/2017  . Iron deficiency anemia 05/16/2017  . Vitamin D deficiency 11/25/2016  . Iron deficiency 11/25/2016  . History of acute myocardial infarction 10/05/2016  . CHF (congestive heart failure) ([place]) 05/06/2016  . Diabetes mellitus with complication ([place]) 52/84/1324  . Leukocytosis 05/06/2016  . Neuropathy 05/06/2016  . Depression 04/16/2016  . Chronic tension-type headache, not intractable 04/16/2016  . AKI (acute kidney injury) ([place])   . Hyperkalemia   .  Nonischemic cardiomyopathy ([place])   . Acute on chronic combined systolic and diastolic ACC/AHA stage C congestive heart failure ([place]) 04/03/2016  . Acute on chronic combined systolic and diastolic CHF, NYHA class 4 ([place]) 04/03/2016  . Hypertensive emergency 04/03/2016  . Cardiomyopathy due to hypertension ([place]) 12/22/2015  .  Normal coronary arteries 12/22/2015  . Troponin level elevated 12/22/2015  . NSTEMI (non-ST elevated myocardial infarction) ([place]) 12/20/2015  . Dental infection 10/10/2015  . Chest pain 09/05/2015  . Systolic CHF, chronic ([place]) 09/05/2015  . LLQ pain   . Type 2 diabetes mellitus without complication ([place]) 123XX123  . Essential hypertension   . 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 Hypertensive urgency 04/22/2014  . Acute CHF ([place]) 04/22/2014  . S/P cesarean section 04/11/2014  . Acute pulmonary edema ([place]) 04/11/2014  . Postoperative anemia 04/11/2014  . Elevated serum creatinine 04/11/2014  . Preeclampsia, severe 04/09/2014  . Pre-eclampsia superimposed on chronic hypertension, antepartum 04/08/2014  . Dyspnea 04/08/2014  . Polyhydramnios in third trimester, antepartum 03/14/2014  . Abnormal maternal glucose tolerance, antepartum 03/11/2014  . High-risk pregnancy 03/11/2014  . Pre-existing essential hypertension complicating pregnancy 0000000  . Impaired glucose tolerance during pregnancy, antepartum 11/27/2013  . Leiomyoma of uterus 11/22/2013  . History of gestational diabetes in prior pregnancy, currently pregnant in first trimester 11/22/2013  . Hx of preeclampsia, prior pregnancy, currently pregnant 11/22/2013  . Short interval between pregnancies affecting pregnancy, antepartum 11/22/2013  . Supervision of high-risk pregnancy of elderly primigravida (>= 21 years old at delivery), third trimester 11/22/2013  . Miscarriage 03/19/2013  . Major depressive disorder, single episode, unspecified 09/27/2011  . Hypertension   . Hypertensive cardiovascular disease   . Microcytic anemia   . Osteoarthrosis involving lower leg 03/29/2011  . Hypokalemia 12/12/2009  . OSA on CPAP 12/09/2009  . Morbid obesity ([place]) 04/16/2009    Past Surgical History:  Procedure Laterality Date  . BREAST REDUCTION SURGERY  2002  . CARDIAC CATHETERIZATION N/A 12/22/2015   Procedure: Left Heart  Cath and Coronary Angiography;  Surgeon: Peter M Martinique, MD;  Location: [facility];  Service: Cardiovascular;  Laterality: N/A;  . CESAREAN SECTION N/A 04/09/2014   Procedure: CESAREAN SECTION;  Surgeon: Mora Bellman, MD;  Location: [facility];  Service: Obstetrics;  Laterality: N/A;  . CHOLECYSTECTOMY    . HYSTERECTOMY ABDOMINAL WITH SALPINGECTOMY N/A 09/26/2019   Procedure: HYSTERECTOMY ABDOMINAL WITH SALPINGECTOMY;  Surgeon: Florian Buff, MD;  Location: [facility];  Service: Gynecology;  Laterality: N/A;  . LIPOMA EXCISION Right 09/26/2019   Procedure: EXCISION LIPOMA RIGHT VULVAR;  Surgeon: Florian Buff, MD;  Location: [facility];  Service: Gynecology;  Laterality: Right;     OB History    Gravida  11   Para  6   Term  5   Preterm  1   AB  5   Living  6     SAB  3   IAB  2   Ectopic      Multiple  0   Live Births  6           Family History  Problem Relation Age of Onset  . Diabetes Mother 89  . Heart disease Mother   . Hyperlipidemia Paternal Grandfather   . Hypertension Paternal Grandfather   . Heart disease Father   . Hypertension Father   . Heart disease Maternal Grandmother 60  . ADD / ADHD Son   . Hypertension Maternal Uncle   . Heart attack Brother   .  Sudden death Neg Hx   . Colon cancer Neg Hx   . Celiac disease Neg Hx   . Inflammatory bowel disease Neg Hx     Social History   Tobacco Use  . Smoking status: Never Smoker  . Smokeless tobacco: Never Used  Vaping Use  . Vaping Use: Never used  Substance Use Topics  . Alcohol use: Yes    Comment: occ  . Drug use: No    Home Medications Prior to Admission medications   Medication Sig Start Date End Date Taking? Authorizing Provider  albuterol (VENTOLIN HFA) 108 (90 Base) MCG/ACT inhaler Inhale 1 puff into the lungs every 6 (six) hours as needed for wheezing or shortness of breath. 03/30/20  Yes Kathie Dike, MD  amLODipine (NORVASC) 10 MG tablet Take 1 tablet (10 mg total) by mouth  daily. 09/04/19 03/28/20 Yes Strader, Fransisco Hertz, PA-C  ARIPiprazole (ABILIFY) 10 MG tablet Take 1 tablet (10 mg total) by mouth daily. 10/30/18  Yes Barton Dubois, MD  aspirin EC 81 MG tablet Take 81 mg by mouth daily.   Yes [provider]  budesonide-formoterol (SYMBICORT) 80-4.5 MCG/ACT inhaler Inhale 2 puffs into the lungs 2 (two) times daily. 03/30/20  Yes Kathie Dike, MD  carvedilol (COREG) 25 MG tablet Take 2 tablets (50 mg total) by mouth 2 (two) times daily with a meal. 01/23/20  Yes Branch, Alphonse Guild, MD  clonazePAM (KLONOPIN) 0.5 MG tablet Take 0.5 mg by mouth 2 (two) times daily. 12/19/18  Yes [provider]  gabapentin (NEURONTIN) 800 MG tablet Take 800 mg by mouth 3 (three) times daily. 09/01/20  Yes [provider]  hydrALAZINE (APRESOLINE) 100 MG tablet Take 1 tablet (100 mg total) by mouth 3 (three) times daily. 01/23/20  Yes BranchAlphonse Guild, MD  insulin glargine (LANTUS) 100 UNIT/ML injection Inject 30 Units at bedtime into the skin.    Yes [provider]  insulin lispro (HUMALOG) 100 UNIT/ML injection Inject 10-16 Units into the skin 3 (three) times daily before meals.   Yes [provider]  Ipratropium-Albuterol (COMBIVENT RESPIMAT) 20-100 MCG/ACT AERS respimat Inhale 1 puff into the lungs every 6 (six) hours as needed for wheezing or shortness of breath. 05/04/18  Yes Manuella Ghazi, Pratik D, DO  linaclotide Rolan Lipa) 145 MCG CAPS capsule Take 1 capsule (145 mcg total) by mouth daily before breakfast. 03/14/19  Yes Jodi Mourning, Kristen S, PA-C  liraglutide (VICTOZA) 18 MG/3ML SOPN Inject 1.2 mg into the skin daily. 09/04/20  Yes Reardon, Juanetta Beets, NP  nitroGLYCERIN (NITROSTAT) 0.4 MG SL tablet Place 1 tablet (0.4 mg total) under the tongue every 5 (five) minutes as needed for chest pain. 09/05/15  Yes Kathie Dike, MD  ondansetron (ZOFRAN ODT) 8 MG disintegrating tablet Take 1 tablet (8 mg total) by mouth every 8 (eight) hours as needed for  nausea or vomiting. 09/06/19  Yes Long, Wonda Olds, MD  potassium chloride SA (KLOR-CON M20) 20 MEQ tablet Take 2 tablets (40 mEq total) by mouth daily. 09/04/19 03/28/20 Yes Strader, Fransisco Hertz, PA-C  pyridostigmine (MESTINON) 60 MG tablet Take 60 mg by mouth every 8 (eight) hours.   Yes [provider]  sacubitril-valsartan (ENTRESTO) 97-103 MG Take 1 tablet by mouth 2 (two) times daily. 03/16/19  Yes BranchAlphonse Guild, MD  torsemide (DEMADEX) 20 MG tablet Take 2 tablets (40 mg total) by mouth 2 (two) times daily. 09/05/20  Yes Verta Ellen., NP  traZODone (DESYREL) 100 MG tablet Take 2 tablets (  200 mg total) by mouth at bedtime. 10/29/18  Yes Barton Dubois, MD  ACCU-CHEK GUIDE test strip USE AS DIRECTED TO TESTCBLOOD SUGAR TWICE DAILY. 06/11/20   [provider]  dextromethorphan-guaiFENesin (MUCINEX DM) 30-600 MG 12hr tablet Take 1 tablet by mouth 2 (two) times daily. Patient not taking: No sig reported 03/30/20   Kathie Dike, MD  triamcinolone ointment (KENALOG) 0.5 % Apply 1 application topically 2 (two) times daily. Patient not taking: Reported on 09/10/2020 12/19/19   Estill Dooms, NP    Allergies    Diclofenac, Tramadol, and Vicodin [hydrocodone-acetaminophen]  Review of Systems   Review of Systems  Constitutional: Positive for diaphoresis. Negative for fever.  HENT: Negative for congestion and sore throat.   Eyes: Negative.   Respiratory: Positive for shortness of breath. Negative for chest tightness.   Cardiovascular: Positive for chest pain. Negative for palpitations and leg swelling.  Gastrointestinal: Positive for abdominal distention. Negative for abdominal pain, nausea and vomiting.  Genitourinary: Negative.   Musculoskeletal: Negative for arthralgias, joint swelling and neck pain.  Skin: Negative.  Negative for rash and wound.  Neurological: Negative for dizziness, weakness, light-headedness, numbness and headaches.  Psychiatric/Behavioral:  Negative.   All other systems reviewed and are negative.   Physical Exam Updated Vital Signs BP (!) 194/124   Pulse 81   Temp 97.6 F (36.4 C) (Oral)   Resp 14   Ht 5\' 6"  (1.676 m)   Wt 108 kg   LMP 08/21/2019   SpO2 93%   BMI 38.41 kg/m   Physical Exam Vitals and nursing note reviewed.  Constitutional:      Appearance: She is well-developed.  HENT:     Head: Normocephalic and atraumatic.  Eyes:     Conjunctiva/sclera: Conjunctivae normal.  Cardiovascular:     Rate and Rhythm: Normal rate and regular rhythm.     Heart sounds: Normal heart sounds.  Pulmonary:     Effort: Pulmonary effort is normal.     Breath sounds: Examination of the right-lower field reveals rales. Examination of the left-lower field reveals rales. Rales present. No wheezing.  Abdominal:     General: Bowel sounds are normal. There is distension.     Palpations: Abdomen is soft.     Tenderness: There is no abdominal tenderness. There is no guarding.  Musculoskeletal:        General: Normal range of motion.     Cervical back: Normal range of motion.     Right lower leg: No edema.     Left lower leg: No edema.  Skin:    General: Skin is warm and dry.  Neurological:     Mental Status: She is alert.     ED Results / Procedures / Treatments   Labs (all labs ordered are listed, but only abnormal results are displayed) Labs Reviewed  BASIC METABOLIC PANEL - Abnormal; Notable for the following components:      Result Value   Potassium 3.3 (*)    Calcium 8.7 (*)    All other components within normal limits  CBC - Abnormal; Notable for the following components:   Hemoglobin 9.9 (*)    HCT 32.9 (*)    MCV 75.6 (*)    MCH 22.8 (*)    All other components within normal limits  BRAIN NATRIURETIC PEPTIDE - Abnormal; Notable for the following components:   B Natriuretic Peptide 2,283.0 (*)    All other components within normal limits  SARS CORONAVIRUS 2 (TAT 6-24 HRS)  POC URINE PREG, ED  TROPONIN  I (HIGH SENSITIVITY)  TROPONIN I (HIGH SENSITIVITY)    EKG EKG Interpretation  Date/Time:  Wednesday September 10 2020 16:57:08 EDT Ventricular Rate:  86 PR Interval:  180 QRS Duration: 112 QT Interval:  405 QTC Calculation: 485 R Axis:   -11 Text Interpretation: Sinus rhythm Incomplete left bundle branch block LVH with secondary repolarization abnormality Since last tracing R axis has shifted Otherwise no significant change Confirmed by Daleen Bo [phone]) on 09/10/2020 5:09:29 PM   Radiology DG Chest Portable 1 View  Result Date: 09/10/2020 CLINICAL DATA:  46 year old female with chest pain and cough. EXAM: PORTABLE CHEST 1 VIEW COMPARISON:  Chest radiograph dated 08/30/2020. FINDINGS: There is cardiomegaly with vascular congestion. No focal consolidation, pleural effusion, or pneumothorax. No acute osseous pathology. IMPRESSION: Cardiomegaly with vascular congestion. No focal consolidation. Electronically Signed   By: Anner Crete M.D.   On: 09/10/2020 17:58    Procedures Procedures   Medications Ordered in ED Medications  nitroGLYCERIN (NITROSTAT) SL tablet 0.4 mg (0.4 mg Sublingual Given 09/10/20 1814)  nitroGLYCERIN 50 mg in dextrose 5 % 250 mL (0.2 mg/mL) infusion (has no administration in time range)  furosemide (LASIX) injection 60 mg (60 mg Intravenous Given 09/10/20 1847)  hydrALAZINE (APRESOLINE) injection 10 mg (10 mg Intravenous Given 09/10/20 1847)  labetalol (NORMODYNE) injection 20 mg (20 mg Intravenous Given 09/10/20 2212)    ED Course  I have reviewed the triage vital signs and the nursing notes.  Pertinent labs & imaging results that were available during my care of the patient were reviewed by me and considered in my medical decision making (see chart for details).    MDM Rules/Calculators/A&P                          Patient's labs and imaging were reviewed, she has acute exacerbation of her CHF based on her chest x-ray and a significant elevation in  her BNP level up to thousand 283.  She also has an anemia with a hemoglobin of 9.9, however this is a stable finding for her.  She continues to have significant elevation in her blood pressure despite receiving nitroglycerin both in route and after arrival followed by IV hydralazine.  Her last blood pressure is 194/124, labetalol 20 mg IV ordered.  Patient was also given Lasix and has had significant diuresis with improvement in shortness of breath.  Her delta troponins are negative.  She will require admission for CHF and hypertensive urgency.  Discussed with Dr. Clearence Ped who accepts pt for admission. Final Clinical Impression(s) / ED Diagnoses Final diagnoses:  Acute on chronic congestive heart failure, unspecified heart failure type [facility])  Hypertensive emergency    Rx / DC Orders ED Discharge Orders    None       Landis Martins 09/10/20 2230    Daleen Bo, MD 09/11/20 [phone]

## 2020-09-11 ENCOUNTER — Encounter (HOSPITAL_COMMUNITY): Payer: Self-pay | Admitting: Family Medicine

## 2020-09-11 ENCOUNTER — Inpatient Hospital Stay (HOSPITAL_COMMUNITY): Payer: Medicaid Other

## 2020-09-11 DIAGNOSIS — E876 Hypokalemia: Secondary | ICD-10-CM

## 2020-09-11 DIAGNOSIS — I509 Heart failure, unspecified: Secondary | ICD-10-CM

## 2020-09-11 DIAGNOSIS — I5043 Acute on chronic combined systolic (congestive) and diastolic (congestive) heart failure: Secondary | ICD-10-CM

## 2020-09-11 DIAGNOSIS — I5041 Acute combined systolic (congestive) and diastolic (congestive) heart failure: Secondary | ICD-10-CM

## 2020-09-11 DIAGNOSIS — E118 Type 2 diabetes mellitus with unspecified complications: Secondary | ICD-10-CM

## 2020-09-11 DIAGNOSIS — E1169 Type 2 diabetes mellitus with other specified complication: Secondary | ICD-10-CM

## 2020-09-11 DIAGNOSIS — I5021 Acute systolic (congestive) heart failure: Secondary | ICD-10-CM

## 2020-09-11 DIAGNOSIS — G4733 Obstructive sleep apnea (adult) (pediatric): Secondary | ICD-10-CM

## 2020-09-11 DIAGNOSIS — I160 Hypertensive urgency: Secondary | ICD-10-CM

## 2020-09-11 LAB — ECHOCARDIOGRAM COMPLETE
AR max vel: 1.61 cm2
AV Area VTI: 1.91 cm2
AV Area mean vel: 1.82 cm2
AV Mean grad: 9 mmHg
AV Peak grad: 15.8 mmHg
Ao pk vel: 1.99 m/s
Area-P 1/2: 3.48 cm2
Height: 66 in
S' Lateral: 4.42 cm
Single Plane A4C EF: 44.9 %
Weight: 3668.45 oz

## 2020-09-11 LAB — COMPREHENSIVE METABOLIC PANEL
ALT: 14 U/L (ref 0–44)
AST: 18 U/L (ref 15–41)
Albumin: 3.3 g/dL — ABNORMAL LOW (ref 3.5–5.0)
Alkaline Phosphatase: 62 U/L (ref 38–126)
Anion gap: 10 (ref 5–15)
BUN: 17 mg/dL (ref 6–20)
CO2: 31 mmol/L (ref 22–32)
Calcium: 8.7 mg/dL — ABNORMAL LOW (ref 8.9–10.3)
Chloride: 98 mmol/L (ref 98–111)
Creatinine, Ser: 0.96 mg/dL (ref 0.44–1.00)
GFR, Estimated: >60 mL/min (ref >60)
Glucose, Bld: 95 mg/dL (ref 70–99)
Potassium: 3 mmol/L — ABNORMAL LOW (ref 3.5–5.1)
Sodium: 139 mmol/L (ref 135–145)
Total Bilirubin: 0.9 mg/dL (ref 0.3–1.2)
Total Protein: 7.2 g/dL (ref 6.5–8.1)

## 2020-09-11 LAB — MRSA PCR SCREENING: MRSA by PCR: POSITIVE — AB

## 2020-09-11 LAB — CBC WITH DIFFERENTIAL/PLATELET
Abs Immature Granulocytes: 0.08 10*3/uL — ABNORMAL HIGH (ref 0.00–0.07)
Basophils Absolute: 0.1 10*3/uL (ref 0.0–0.1)
Basophils Relative: 1 %
Eosinophils Absolute: 0.1 10*3/uL (ref 0.0–0.5)
Eosinophils Relative: 1 %
HCT: 33.6 % — ABNORMAL LOW (ref 36.0–46.0)
Hemoglobin: 10.2 g/dL — ABNORMAL LOW (ref 12.0–15.0)
Immature Granulocytes: 1 %
Lymphocytes Relative: 27 %
Lymphs Abs: 2.6 10*3/uL (ref 0.7–4.0)
MCH: 22.5 pg — ABNORMAL LOW (ref 26.0–34.0)
MCHC: 30.4 g/dL (ref 30.0–36.0)
MCV: 74.2 fL — ABNORMAL LOW (ref 80.0–100.0)
Monocytes Absolute: 0.5 10*3/uL (ref 0.1–1.0)
Monocytes Relative: 5 %
Neutro Abs: 6.3 10*3/uL (ref 1.7–7.7)
Neutrophils Relative %: 65 %
Platelets: 265 10*3/uL (ref 150–400)
RBC: 4.53 MIL/uL (ref 3.87–5.11)
RDW: 14.6 % (ref 11.5–15.5)
WBC: 9.6 10*3/uL (ref 4.0–10.5)
nRBC: 0 % (ref 0.0–0.2)

## 2020-09-11 LAB — GLUCOSE, CAPILLARY
Glucose-Capillary: 91 mg/dL (ref 70–99)
Glucose-Capillary: 107 mg/dL — ABNORMAL HIGH (ref 70–99)
Glucose-Capillary: 152 mg/dL — ABNORMAL HIGH (ref 70–99)
Glucose-Capillary: 189 mg/dL — ABNORMAL HIGH (ref 70–99)
Glucose-Capillary: 179 mg/dL — ABNORMAL HIGH (ref 70–99)

## 2020-09-11 LAB — MAGNESIUM: Magnesium: 1.6 mg/dL — ABNORMAL LOW (ref 1.7–2.4)

## 2020-09-11 LAB — SARS CORONAVIRUS 2 (TAT 6-24 HRS): SARS Coronavirus 2: NEGATIVE

## 2020-09-11 MED ORDER — INSULIN GLARGINE 100 UNIT/ML ~~LOC~~ SOLN
22.0000 [IU] | Freq: Every day | SUBCUTANEOUS | Status: DC
  Administered 2020-09-11: 22 [IU] via SUBCUTANEOUS
  Filled 2020-09-11 (×4): qty 0.22

## 2020-09-11 MED ORDER — ARIPIPRAZOLE 10 MG PO TABS
10.0000 mg | ORAL_TABLET | Freq: Every day | ORAL | Status: DC
  Administered 2020-09-11 – 2020-09-13 (×3): 10 mg via ORAL
  Filled 2020-09-11 (×3): qty 1

## 2020-09-11 MED ORDER — MAGNESIUM SULFATE 2 GM/50ML IV SOLN
2.0000 g | Freq: Once | INTRAVENOUS | Status: AC
  Administered 2020-09-11: 2 g via INTRAVENOUS
  Filled 2020-09-11: qty 50

## 2020-09-11 MED ORDER — TRAZODONE HCL 50 MG PO TABS: 200.0000 mg | ORAL_TABLET | Freq: Every day | ORAL | Status: DC

## 2020-09-11 MED ORDER — ASPIRIN EC 81 MG PO TBEC
81.0000 mg | DELAYED_RELEASE_TABLET | Freq: Every day | ORAL | Status: DC
  Administered 2020-09-11 – 2020-09-13 (×3): 81 mg via ORAL
  Filled 2020-09-11 (×3): qty 1

## 2020-09-11 MED ORDER — ACETAMINOPHEN 650 MG RE SUPP: 650.0000 mg | Freq: Four times a day (QID) | RECTAL | Status: DC | PRN

## 2020-09-11 MED ORDER — ONDANSETRON HCL 4 MG PO TABS
4.0000 mg | ORAL_TABLET | Freq: Four times a day (QID) | ORAL | Status: DC | PRN
  Administered 2020-09-12: 4 mg via ORAL
  Filled 2020-09-11: qty 1

## 2020-09-11 MED ORDER — CLONAZEPAM 0.5 MG PO TABS
0.5000 mg | ORAL_TABLET | Freq: Two times a day (BID) | ORAL | Status: DC
  Administered 2020-09-11 – 2020-09-13 (×6): 0.5 mg via ORAL
  Filled 2020-09-11 (×6): qty 1

## 2020-09-11 MED ORDER — HYDRALAZINE HCL 25 MG PO TABS
100.0000 mg | ORAL_TABLET | Freq: Three times a day (TID) | ORAL | Status: DC
  Administered 2020-09-11 – 2020-09-13 (×6): 100 mg via ORAL
  Filled 2020-09-11 (×6): qty 4

## 2020-09-11 MED ORDER — INSULIN ASPART 100 UNIT/ML ~~LOC~~ SOLN
0.0000 [IU] | Freq: Three times a day (TID) | SUBCUTANEOUS | Status: DC
  Administered 2020-09-11 – 2020-09-12 (×3): 3 [IU] via SUBCUTANEOUS
  Administered 2020-09-12: 5 [IU] via SUBCUTANEOUS
  Administered 2020-09-12: 3 [IU] via SUBCUTANEOUS
  Administered 2020-09-13: 5 [IU] via SUBCUTANEOUS

## 2020-09-11 MED ORDER — POTASSIUM CHLORIDE CRYS ER 20 MEQ PO TBCR
40.0000 meq | EXTENDED_RELEASE_TABLET | Freq: Once | ORAL | Status: AC
  Administered 2020-09-11: 40 meq via ORAL
  Filled 2020-09-11: qty 2

## 2020-09-11 MED ORDER — CARVEDILOL 12.5 MG PO TABS
50.0000 mg | ORAL_TABLET | Freq: Two times a day (BID) | ORAL | Status: DC
  Administered 2020-09-11 – 2020-09-13 (×5): 50 mg via ORAL
  Filled 2020-09-11 (×5): qty 4

## 2020-09-11 MED ORDER — PYRIDOSTIGMINE BROMIDE 60 MG PO TABS
60.0000 mg | ORAL_TABLET | Freq: Three times a day (TID) | ORAL | Status: DC
  Administered 2020-09-11 – 2020-09-13 (×8): 60 mg via ORAL
  Filled 2020-09-11 (×8): qty 1

## 2020-09-11 MED ORDER — ALBUTEROL SULFATE HFA 108 (90 BASE) MCG/ACT IN AERS: 1.0000 | INHALATION_SPRAY | Freq: Four times a day (QID) | RESPIRATORY_TRACT | Status: DC | PRN

## 2020-09-11 MED ORDER — INSULIN ASPART 100 UNIT/ML ~~LOC~~ SOLN: 0.0000 [IU] | Freq: Every day | SUBCUTANEOUS | Status: DC

## 2020-09-11 MED ORDER — OXYCODONE HCL 5 MG PO TABS
5.0000 mg | ORAL_TABLET | ORAL | Status: DC | PRN
  Administered 2020-09-11 – 2020-09-12 (×3): 5 mg via ORAL
  Filled 2020-09-11 (×3): qty 1

## 2020-09-11 MED ORDER — ACETAMINOPHEN 325 MG PO TABS
650.0000 mg | ORAL_TABLET | Freq: Four times a day (QID) | ORAL | Status: DC | PRN
  Administered 2020-09-11: 650 mg via ORAL
  Filled 2020-09-11: qty 2

## 2020-09-11 MED ORDER — HEPARIN SODIUM (PORCINE) 5000 UNIT/ML IJ SOLN
5000.0000 [IU] | Freq: Three times a day (TID) | INTRAMUSCULAR | Status: DC
  Administered 2020-09-11 – 2020-09-13 (×7): 5000 [IU] via SUBCUTANEOUS
  Filled 2020-09-11 (×7): qty 1

## 2020-09-11 MED ORDER — POTASSIUM CHLORIDE 20 MEQ PO PACK
40.0000 meq | PACK | Freq: Once | ORAL | Status: DC
  Filled 2020-09-11: qty 2

## 2020-09-11 MED ORDER — TRAZODONE HCL 50 MG PO TABS
200.0000 mg | ORAL_TABLET | Freq: Every day | ORAL | Status: DC
  Administered 2020-09-11 – 2020-09-12 (×3): 200 mg via ORAL
  Filled 2020-09-11 (×3): qty 4

## 2020-09-11 MED ORDER — POLYETHYLENE GLYCOL 3350 17 G PO PACK: 17.0000 g | PACK | Freq: Every day | ORAL | Status: DC | PRN

## 2020-09-11 MED ORDER — FUROSEMIDE 10 MG/ML IJ SOLN
60.0000 mg | Freq: Two times a day (BID) | INTRAMUSCULAR | Status: DC
  Administered 2020-09-11 – 2020-09-13 (×5): 60 mg via INTRAVENOUS
  Filled 2020-09-11 (×5): qty 6

## 2020-09-11 MED ORDER — MOMETASONE FURO-FORMOTEROL FUM 100-5 MCG/ACT IN AERO
2.0000 | INHALATION_SPRAY | Freq: Two times a day (BID) | RESPIRATORY_TRACT | Status: DC
  Administered 2020-09-11 – 2020-09-13 (×5): 2 via RESPIRATORY_TRACT
  Filled 2020-09-11: qty 8.8

## 2020-09-11 MED ORDER — PANTOPRAZOLE SODIUM 40 MG PO TBEC
40.0000 mg | DELAYED_RELEASE_TABLET | Freq: Two times a day (BID) | ORAL | Status: DC
  Administered 2020-09-11 – 2020-09-13 (×5): 40 mg via ORAL
  Filled 2020-09-11 (×5): qty 1

## 2020-09-11 MED ORDER — FAMOTIDINE 20 MG PO TABS
40.0000 mg | ORAL_TABLET | Freq: Once | ORAL | Status: AC
  Administered 2020-09-11: 40 mg via ORAL
  Filled 2020-09-11: qty 2

## 2020-09-11 MED ORDER — AMLODIPINE BESYLATE 5 MG PO TABS
10.0000 mg | ORAL_TABLET | Freq: Every day | ORAL | Status: DC
  Administered 2020-09-11 – 2020-09-13 (×3): 10 mg via ORAL
  Filled 2020-09-11 (×3): qty 2

## 2020-09-11 MED ORDER — GABAPENTIN 400 MG PO CAPS
800.0000 mg | ORAL_CAPSULE | Freq: Three times a day (TID) | ORAL | Status: DC
  Administered 2020-09-11 – 2020-09-13 (×7): 800 mg via ORAL
  Filled 2020-09-11 (×7): qty 2

## 2020-09-11 MED ORDER — GABAPENTIN 400 MG PO CAPS
800.0000 mg | ORAL_CAPSULE | Freq: Once | ORAL | Status: AC
  Administered 2020-09-11: 800 mg via ORAL
  Filled 2020-09-11: qty 2

## 2020-09-11 MED ORDER — ALUM & MAG HYDROXIDE-SIMETH 200-200-20 MG/5ML PO SUSP
30.0000 mL | Freq: Once | ORAL | Status: AC
  Administered 2020-09-11: 30 mL via ORAL
  Filled 2020-09-11: qty 30

## 2020-09-11 MED ORDER — SACUBITRIL-VALSARTAN 97-103 MG PO TABS
1.0000 | ORAL_TABLET | Freq: Two times a day (BID) | ORAL | Status: DC
  Administered 2020-09-11 – 2020-09-13 (×4): 1 via ORAL
  Filled 2020-09-11 (×9): qty 1

## 2020-09-11 MED ORDER — ONDANSETRON HCL 4 MG/2ML IJ SOLN: 4.0000 mg | Freq: Four times a day (QID) | INTRAMUSCULAR | Status: DC | PRN

## 2020-09-11 NOTE — H&P (Signed)
TRH H&P    Patient Demographics:    Chelsey Santiago, is a 45 y.o. female  MRN: [id number]  DOB - 06/07/1975  Admit Date - 09/10/2020  Referring MD/NP/PA: Alfonzo Feller  Outpatient Primary MD for the patient is Rosita Fire, MD  Patient coming from: Home  Chief complaint- Dyspnea   HPI:    Chelsey Santiago  is a 45 y.o. female, with history of obesity, hypertensive heart disease, hypertension, diabetes mellitus type 2, chronic combined systolic and diastolic CHF, anxiety, and more presents the ED with chief complaint of dyspnea.  Dyspnea started at 4 AM the same day as presentation.  She reports an associated chest pressure in the center of her chest as well.  When the pain started she was diaphoretic and could not sleep.  Dyspnea progressed throughout the day, was worse with exertion, and improved but was not relieved with rest.  She reports a dry cough associated, as well as orthopnea.  Her chest pressure did not radiate.  It is also exertional.  It is relieved at the time of my exam.  Patient denies any fevers.  Patient reports that she has as needed oxygen at home, but she did not think to try it.  She did try her rescue inhaler and it did offer some relief.  She had associated blurry vision, but no headaches or dizziness.  Patient reports that she has been compliant with her anti-hypertensive medications.  She does report that she ran out of torsemide, and was out of it for about 7 days.  She reports that she was seen in the ED and discharged home with furosemide, but continued to gain weight while taking furosemide.  Patient reports that overall she gained about 19 pounds over 7 days.  She was seen by cardiology and they refilled her torsemide, but she reports that she continued to gain weight.  She usually has peripheral edema, but this time reports that the fluid has been concentrated in her abdomen.  She reports that her  abdomen felt distended and uncomfortable.  Patient reports feeling much better since treatment in the ED.  Patient does not smoke, drinks alcohol 2 times per month, does not use illicit drugs.  She is not vaccinated for COVID.  Patient is full code.  In the ED Temp 97.6, heart rate 81-91, respiratory rate 14-29, blood pressure 194/124, satting on 2 L nasal cannula Hydralazine 10 mg IV, labetalol 20 mg IV Lasix 60 mg IV No leukocytosis with a white blood cell count of 9.9, hemoglobin 9.9, chemistry panel reveals a hypokalemia at 3.3 BNP is 2283, troponin is stable at 14 and 16 Chest x-ray shows cardiomegaly with vascular congestion Admission requested for further treatment of hypertensive crisis and acute CHF    Review of systems:    In addition to the HPI above,  No Fever-chills, No Headache, No changes with  hearing, No problems swallowing food or Liquids, No Abdominal pain, No Nausea or Vomiting, bowel movements are regular, No Blood in stool or Urine, No dysuria, No new skin rashes or  bruises, No new joints pains-aches,  No new weakness, tingling, numbness in any extremity, No recent weight gain or loss, No polyuria, polydypsia or polyphagia, No significant Mental Stressors.  All other systems reviewed and are negative.    Past History of the following :    Past Medical History:  Diagnosis Date  . Anemia    H&H of 10.6/33 and 07/2008 and 11.9/35 and 09/2010  . Anxiety   . Chronic combined systolic and diastolic CHF (congestive heart failure) ([place])   . Depression with anxiety   . Diabetes mellitus without complication ([place])   . Hypertension   . Hypertensive heart disease 2009   Pulmonary edema postpartum; mild to moderate mitral regurgitation when hospitalized for CHF in 2009; Echocardiogram in 12/2009-no MR and normal EF; normal CXR in 09/2010  . Migraine headache   . Miscarriage 03/19/2013  . Obesity 04/16/2009  . Osteoarthritis, knee 03/29/2011  . Preeclampsia   .  Pulmonary edema   . Sleep apnea   . Threatened abortion in early pregnancy 03/15/2013      Past Surgical History:  Procedure Laterality Date  . BREAST REDUCTION SURGERY  2002  . CARDIAC CATHETERIZATION N/A 12/22/2015   Procedure: Left Heart Cath and Coronary Angiography;  Surgeon: Peter M Martinique, MD;  Location: [facility];  Service: Cardiovascular;  Laterality: N/A;  . CESAREAN SECTION N/A 04/09/2014   Procedure: CESAREAN SECTION;  Surgeon: Mora Bellman, MD;  Location: [facility];  Service: Obstetrics;  Laterality: N/A;  . CHOLECYSTECTOMY    . HYSTERECTOMY ABDOMINAL WITH SALPINGECTOMY N/A 09/26/2019   Procedure: HYSTERECTOMY ABDOMINAL WITH SALPINGECTOMY;  Surgeon: Florian Buff, MD;  Location: [facility];  Service: Gynecology;  Laterality: N/A;  . LIPOMA EXCISION Right 09/26/2019   Procedure: EXCISION LIPOMA RIGHT VULVAR;  Surgeon: Florian Buff, MD;  Location: [facility];  Service: Gynecology;  Laterality: Right;      Social History:      Social History   Tobacco Use  . Smoking status: Never Smoker  . Smokeless tobacco: Never Used  Substance Use Topics  . Alcohol use: Yes    Comment: occ       Family History :     Family History  Problem Relation Age of Onset  . Diabetes Mother 31  . Heart disease Mother   . Hyperlipidemia Paternal Grandfather   . Hypertension Paternal Grandfather   . Heart disease Father   . Hypertension Father   . Heart disease Maternal Grandmother 60  . ADD / ADHD Son   . Hypertension Maternal Uncle   . Heart attack Brother   . Sudden death Neg Hx   . Colon cancer Neg Hx   . Celiac disease Neg Hx   . Inflammatory bowel disease Neg Hx       Home Medications:   Prior to Admission medications   Medication Sig Start Date End Date Taking? Authorizing Provider  albuterol (VENTOLIN HFA) 108 (90 Base) MCG/ACT inhaler Inhale 1 puff into the lungs every 6 (six) hours as needed for wheezing or shortness of breath. 03/30/20  Yes Kathie Dike, MD   amLODipine (NORVASC) 10 MG tablet Take 1 tablet (10 mg total) by mouth daily. 09/04/19 03/28/20 Yes Strader, Fransisco Hertz, PA-C  ARIPiprazole (ABILIFY) 10 MG tablet Take 1 tablet (10 mg total) by mouth daily. 10/30/18  Yes Barton Dubois, MD  aspirin EC 81 MG tablet Take 81 mg by mouth daily.   Yes [provider]  budesonide-formoterol (SYMBICORT) 80-4.5 MCG/ACT  inhaler Inhale 2 puffs into the lungs 2 (two) times daily. 03/30/20  Yes Kathie Dike, MD  carvedilol (COREG) 25 MG tablet Take 2 tablets (50 mg total) by mouth 2 (two) times daily with a meal. 01/23/20  Yes Branch, Alphonse Guild, MD  clonazePAM (KLONOPIN) 0.5 MG tablet Take 0.5 mg by mouth 2 (two) times daily. 12/19/18  Yes [provider]  gabapentin (NEURONTIN) 800 MG tablet Take 800 mg by mouth 3 (three) times daily. 09/01/20  Yes [provider]  hydrALAZINE (APRESOLINE) 100 MG tablet Take 1 tablet (100 mg total) by mouth 3 (three) times daily. 01/23/20  Yes BranchAlphonse Guild, MD  insulin glargine (LANTUS) 100 UNIT/ML injection Inject 30 Units at bedtime into the skin.    Yes [provider]  insulin lispro (HUMALOG) 100 UNIT/ML injection Inject 10-16 Units into the skin 3 (three) times daily before meals.   Yes [provider]  Ipratropium-Albuterol (COMBIVENT RESPIMAT) 20-100 MCG/ACT AERS respimat Inhale 1 puff into the lungs every 6 (six) hours as needed for wheezing or shortness of breath. 05/04/18  Yes Manuella Ghazi, Pratik D, DO  linaclotide Rolan Lipa) 145 MCG CAPS capsule Take 1 capsule (145 mcg total) by mouth daily before breakfast. 03/14/19  Yes Jodi Mourning, Kristen S, PA-C  liraglutide (VICTOZA) 18 MG/3ML SOPN Inject 1.2 mg into the skin daily. 09/04/20  Yes Reardon, Juanetta Beets, NP  nitroGLYCERIN (NITROSTAT) 0.4 MG SL tablet Place 1 tablet (0.4 mg total) under the tongue every 5 (five) minutes as needed for chest pain. 09/05/15  Yes Kathie Dike, MD  ondansetron (ZOFRAN ODT) 8 MG disintegrating tablet Take  1 tablet (8 mg total) by mouth every 8 (eight) hours as needed for nausea or vomiting. 09/06/19  Yes Long, Wonda Olds, MD  potassium chloride SA (KLOR-CON M20) 20 MEQ tablet Take 2 tablets (40 mEq total) by mouth daily. 09/04/19 03/28/20 Yes Strader, Fransisco Hertz, PA-C  pyridostigmine (MESTINON) 60 MG tablet Take 60 mg by mouth every 8 (eight) hours.   Yes [provider]  sacubitril-valsartan (ENTRESTO) 97-103 MG Take 1 tablet by mouth 2 (two) times daily. 03/16/19  Yes BranchAlphonse Guild, MD  torsemide (DEMADEX) 20 MG tablet Take 2 tablets (40 mg total) by mouth 2 (two) times daily. 09/05/20  Yes Verta Ellen., NP  traZODone (DESYREL) 100 MG tablet Take 2 tablets (200 mg total) by mouth at bedtime. 10/29/18  Yes Barton Dubois, MD  ACCU-CHEK GUIDE test strip USE AS DIRECTED TO TESTCBLOOD SUGAR TWICE DAILY. 06/11/20   [provider]  dextromethorphan-guaiFENesin (MUCINEX DM) 30-600 MG 12hr tablet Take 1 tablet by mouth 2 (two) times daily. Patient not taking: No sig reported 03/30/20   Kathie Dike, MD  triamcinolone ointment (KENALOG) 0.5 % Apply 1 application topically 2 (two) times daily. Patient not taking: Reported on 09/10/2020 12/19/19   Estill Dooms, NP     Allergies:     Allergies  Allergen Reactions  . Diclofenac Swelling    AND POSSIBLE SYNCOPE; tolerates ibuprofen per pt  . Tramadol Nausea And Vomiting and Nausea Only    Itching (12/21); tolerates ibuprofen per pt  . Vicodin [Hydrocodone-Acetaminophen] Itching and Nausea Only     Physical Exam:   Vitals  Blood pressure (!) 182/113, pulse 82, temperature 98 F (36.7 C), resp. rate 17, height 5\' 6"  (1.676 m), weight 104 kg, last menstrual period 08/21/2019, SpO2 92 %.  1.  General: Patient lying supine in bed head of bed elevated  2. Psychiatric:  Mood and behavior normal for situation, patient alert and oriented x3, cooperative with exam  3. Neurologic: Face is symmetric, speech and language  are normal, moves all 4 extremities voluntarily, no acute deficit on limited exam  4. HEENMT:  Head is atraumatic, normocephalic, pupils reactive to light, neck is supple, trachea is midline, mucous membranes are moist  5. Respiratory : Lungs are clear to auscultation bilaterally without crackles, rhonchi, wheezes, no increased work of breathing, no cyanosis  6. Cardiovascular : Heart rate is normal, rhythm is regular, no murmurs rubs or gallops, trace peripheral edema,  7. Gastrointestinal:  Abdomen is soft, nondistended, nontender to palpation, no palpable masses, bowel sounds active  8. Skin:  Skin is warm dry and intact without acute lesion on limited exam  9.Musculoskeletal:  No acute deformity, trace peripheral edema, peripheral pulses palpated    Data Review:    CBC Recent Labs  Lab 09/10/20 1803  WBC 9.9  HGB 9.9*  HCT 32.9*  PLT 250  MCV 75.6*  MCH 22.8*  MCHC 30.1  RDW 14.9   ------------------------------------------------------------------------------------------------------------------  Results for orders placed or performed during the hospital encounter of 09/10/20 (from the past 48 hour(s))  POC urine preg, ED     Status: None   Collection Time: 09/10/20  5:13 PM  Result Value Ref Range   Preg Test, Ur NEGATIVE NEGATIVE    Comment:        THE SENSITIVITY OF THIS METHODOLOGY IS >24 mIU/mL   Basic metabolic panel     Status: Abnormal   Collection Time: 09/10/20  6:03 PM  Result Value Ref Range   Sodium 137 135 - 145 mmol/L   Potassium 3.3 (L) 3.5 - 5.1 mmol/L   Chloride 100 98 - 111 mmol/L   CO2 28 22 - 32 mmol/L   Glucose, Bld 91 70 - 99 mg/dL    Comment: Glucose reference range applies only to samples taken after fasting for at least 8 hours.   BUN 17 6 - 20 mg/dL   Creatinine, Ser 0.96 0.44 - 1.00 mg/dL   Calcium 8.7 (L) 8.9 - 10.3 mg/dL   GFR, Estimated >60 >60 mL/min    Comment: (NOTE) Calculated using the CKD-EPI Creatinine Equation  (2021)    Anion gap 9 5 - 15    Comment: Performed at [facility], [address] [place], [place] [address]  CBC     Status: Abnormal   Collection Time: 09/10/20  6:03 PM  Result Value Ref Range   WBC 9.9 4.0 - 10.5 K/uL   RBC 4.35 3.87 - 5.11 MIL/uL   Hemoglobin 9.9 (L) 12.0 - 15.0 g/dL   HCT 32.9 (L) 36.0 - 46.0 %   MCV 75.6 (L) 80.0 - 100.0 fL   MCH 22.8 (L) 26.0 - 34.0 pg   MCHC 30.1 30.0 - 36.0 g/dL   RDW 14.9 11.5 - 15.5 %   Platelets 250 150 - 400 K/uL   nRBC 0.0 0.0 - 0.2 %    Comment: Performed at [facility], [address], [place],  [address]  Troponin I (High Sensitivity)     Status: None   Collection Time: 09/10/20  6:03 PM  Result Value Ref Range   Troponin I (High Sensitivity) 14 <18 ng/L    Comment: (NOTE) Elevated high sensitivity troponin I (hsTnI) values and significant  changes across serial measurements may suggest ACS but many other  chronic and acute conditions are known to elevate hsTnI results.  Refer  to the "Links" section for chest pain algorithms and additional  guidance. Performed at [facility], [address] [place], [place] [address]   Brain natriuretic peptide     Status: Abnormal   Collection Time: 09/10/20  6:05 PM  Result Value Ref Range   B Natriuretic Peptide 2,283.0 (H) 0.0 - 100.0 pg/mL    Comment: Performed at [facility], [address], [place], [place] [address]  Troponin I (High Sensitivity)     Status: None   Collection Time: 09/10/20  8:07 PM  Result Value Ref Range   Troponin I (High Sensitivity) 16 <18 ng/L    Comment: (NOTE) Elevated high sensitivity troponin I (hsTnI) values and significant  changes across serial measurements may suggest ACS but many other  chronic and acute conditions are known to elevate hsTnI results.  Refer to the "Links" section for chest pain algorithms and additional  guidance. Performed at [facility], [address], [place], [place] [address]   Resp Panel by RT-PCR (Flu A&B, Covid)  Nasopharyngeal Swab     Status: None   Collection Time: 09/10/20 10:33 PM   Specimen: Nasopharyngeal Swab; Nasopharyngeal(NP) swabs in vial transport medium  Result Value Ref Range   SARS Coronavirus 2 by RT PCR NEGATIVE NEGATIVE    Comment: (NOTE) SARS-CoV-2 target nucleic acids are NOT DETECTED.  The SARS-CoV-2 RNA is generally detectable in upper respiratory specimens during the acute phase of infection. The lowest concentration of SARS-CoV-2 viral copies this assay can detect is 138 copies/mL. A negative result does not preclude SARS-Cov-2 infection and should not be used as the sole basis for treatment or other patient management decisions. A negative result may occur with  improper specimen collection/handling, submission of specimen other than nasopharyngeal swab, presence of viral mutation(s) within the areas targeted by this assay, and inadequate number of viral copies(<138 copies/mL). A negative result must be combined with clinical observations, patient history, and epidemiological information. The expected result is Negative.  Fact Sheet for Patients:  EntrepreneurPulse.com.au  Fact Sheet for Healthcare Providers:  IncredibleEmployment.be  This test is no t yet approved or cleared by the [place] FDA and  has been authorized for detection and/or diagnosis of SARS-CoV-2 by FDA under an Emergency Use Authorization (EUA). This EUA will remain  in effect (meaning this test can be used) for the duration of the COVID-19 declaration under Section 564(b)(1) of the Act, 21 U.S.C.section 360bbb-3(b)(1), unless the authorization is terminated  or revoked sooner.       Influenza A by PCR NEGATIVE NEGATIVE   Influenza B by PCR NEGATIVE NEGATIVE    Comment: (NOTE) The Xpert Xpress SARS-CoV-2/FLU/RSV plus assay is intended as an aid in the diagnosis of influenza from Nasopharyngeal swab specimens and should not be used as a sole basis for  treatment. Nasal washings and aspirates are unacceptable for Xpert Xpress SARS-CoV-2/FLU/RSV testing.  Fact Sheet for Patients: EntrepreneurPulse.com.au  Fact Sheet for Healthcare Providers: IncredibleEmployment.be  This test is not yet approved or cleared by the [place] FDA and has been authorized for detection and/or diagnosis of SARS-CoV-2 by FDA under an Emergency Use Authorization (EUA). This EUA will remain in effect (meaning this test can be used) for the duration of the COVID-19 declaration under Section 564(b)(1) of the Act, 21 U.S.C. section 360bbb-3(b)(1), unless the authorization is terminated or revoked.  Performed at [facility], [address] [place], [place] [address]   Glucose, capillary     Status: None   Collection  Time: 09/11/20  1:40 AM  Result Value Ref Range   Glucose-Capillary 91 70 - 99 mg/dL    Comment: Glucose reference range applies only to samples taken after fasting for at least 8 hours.    Chemistries  Recent Labs  Lab 09/10/20 1803  NA 137  K 3.3*  CL 100  CO2 28  GLUCOSE 91  BUN 17  CREATININE 0.96  CALCIUM 8.7*   ------------------------------------------------------------------------------------------------------------------  ------------------------------------------------------------------------------------------------------------------ GFR: Estimated Creatinine Clearance: 90.2 mL/min (by C-G formula based on SCr of 0.96 mg/dL). Liver Function Tests: No results for input(s): AST, ALT, ALKPHOS, BILITOT, PROT, ALBUMIN in the last 168 hours. No results for input(s): LIPASE, AMYLASE in the last 168 hours. No results for input(s): AMMONIA in the last 168 hours. Coagulation Profile: No results for input(s): INR, PROTIME in the last 168 hours. Cardiac Enzymes: No results for input(s): CKTOTAL, CKMB, CKMBINDEX, TROPONINI in the last 168 hours. BNP (last 3 results) No results for input(s):  PROBNP in the last 8760 hours. HbA1C: No results for input(s): HGBA1C in the last 72 hours. CBG: Recent Labs  Lab 09/11/20 0140  GLUCAP 91   Lipid Profile: No results for input(s): CHOL, HDL, LDLCALC, TRIG, CHOLHDL, LDLDIRECT in the last 72 hours. Thyroid Function Tests: No results for input(s): TSH, T4TOTAL, FREET4, T3FREE, THYROIDAB in the last 72 hours. Anemia Panel: No results for input(s): VITAMINB12, FOLATE, FERRITIN, TIBC, IRON, RETICCTPCT in the last 72 hours.  --------------------------------------------------------------------------------------------------------------- Urine analysis:    Component Value Date/Time   COLORURINE YELLOW 08/28/2019 1226   APPEARANCEUR CLEAR 08/28/2019 1226   LABSPEC 1.028 08/28/2019 1226   PHURINE 6.0 08/28/2019 1226   GLUCOSEU >=500 (A) 08/28/2019 1226   HGBUR NEGATIVE 08/28/2019 1226   BILIRUBINUR NEGATIVE 08/28/2019 1226   KETONESUR NEGATIVE 08/28/2019 1226   PROTEINUR Negative 12/19/2019 1010   PROTEINUR NEGATIVE 08/28/2019 1226   UROBILINOGEN 0.2 04/29/2014 1115   NITRITE n 12/19/2019 1010   NITRITE NEGATIVE 08/28/2019 1226   LEUKOCYTESUR Negative 12/19/2019 1010   LEUKOCYTESUR TRACE (A) 08/28/2019 1226      Imaging Results:    DG Chest Portable 1 View  Result Date: 09/10/2020 CLINICAL DATA:  45 year old female with chest pain and cough. EXAM: PORTABLE CHEST 1 VIEW COMPARISON:  Chest radiograph dated 08/30/2020. FINDINGS: There is cardiomegaly with vascular congestion. No focal consolidation, pleural effusion, or pneumothorax. No acute osseous pathology. IMPRESSION: Cardiomegaly with vascular congestion. No focal consolidation. Electronically Signed   By: Anner Crete M.D.   On: 09/10/2020 17:58    My personal review of EKG: Rhythm NSR, Rate 86 /min, QTc 485 ,no Acute ST changes   Assessment & Plan:    Principal Problem:   Acute CHF ([place]) Active Problems:   Hypokalemia   Hypertensive emergency   Diabetes mellitus  with complication ([place])   1. Hypertensive emergency 1. BP 200s/120s with acute exacerbation of CHF and chest pressure 2. Trop flat 3. Hydralazine and labetalol did not improve BP 4. Start nitro drip 5. Monitor in ICU 2. Acute CHF 1. Daily weights, Strict I and O 2. Last echo was 03/2020 - EF 30-35% with global hypokinesis and Grade II diastolic dysfunction - repeat echo is not ordered 3. BNP 2283, chest x-ray shows cardiomegaly with vascular congestion 4. Continue asa, coreg, and entresto 5. Continue diuretic 6. Continue nitro drip 3. Hypokalemia 1. Replace and recheck 2. Check mag in the AM 4. DMII 1. Takes 30 units insulin at home 2. Continue 22 units in the hospital, and  sliding scale coverage 3. Continue to monitor 5.    DVT Prophylaxis-   Heparin - SCDs   AM Labs Ordered, also please review Full Orders  Family Communication: No family at bedside  Code Status:  Full  Admission status:Inpatient :The appropriate admission status for this patient is INPATIENT. Inpatient status is judged to be reasonable and necessary in order to provide the required intensity of service to ensure the patient's safety. The patient's presenting symptoms, physical exam findings, and initial radiographic and laboratory data in the context of their chronic comorbidities is felt to place them at high risk for further clinical deterioration. Furthermore, it is not anticipated that the patient will be medically stable for discharge from the hospital within 2 midnights of admission. The following factors support the admission status of inpatient.     The patient's presenting symptoms include dyspnea and chest pressure The worrisome physical exam findings include Hypertensive crisis. The initial radiographic and laboratory data are worrisome because of CXR consistent with CHF, BNP elevated. The chronic co-morbidities include DMII, HTN, CHF.       * I certify that at the point of admission it is  my clinical judgment that the patient will require inpatient hospital care spanning beyond 2 midnights from the point of admission due to high intensity of service, high risk for further deterioration and high frequency of surveillance required.*  Time spent in minutes :Full   [place] B Zierle-Ghosh DO

## 2020-09-11 NOTE — Progress Notes (Signed)
ASSUMPTION OF CARE NOTE   09/11/2020 11:49 AM  Chelsey Santiago was seen and examined.  The H&P by the admitting provider, orders, imaging was reviewed.  Please see new orders.  Will continue to follow.   Vitals:   09/11/20 1115 09/11/20 1130  BP: (!) 145/88 (!) 142/89  Pulse: 72 73  Resp: (!) 31 18  Temp:    SpO2: 99% 98%    Results for orders placed or performed during the hospital encounter of 09/10/20  SARS CORONAVIRUS 2 (TAT 6-24 HRS) Nasopharyngeal Nasopharyngeal Swab   Specimen: Nasopharyngeal Swab  Result Value Ref Range   SARS Coronavirus 2 NEGATIVE NEGATIVE  Resp Panel by RT-PCR (Flu A&B, Covid) Nasopharyngeal Swab   Specimen: Nasopharyngeal Swab; Nasopharyngeal(NP) swabs in vial transport medium  Result Value Ref Range   SARS Coronavirus 2 by RT PCR NEGATIVE NEGATIVE   Influenza A by PCR NEGATIVE NEGATIVE   Influenza B by PCR NEGATIVE NEGATIVE  MRSA PCR Screening   Specimen: Nasal Mucosa; Nasopharyngeal  Result Value Ref Range   MRSA by PCR POSITIVE (A) NEGATIVE  Basic metabolic panel  Result Value Ref Range   Sodium 137 135 - 145 mmol/L   Potassium 3.3 (L) 3.5 - 5.1 mmol/L   Chloride 100 98 - 111 mmol/L   CO2 28 22 - 32 mmol/L   Glucose, Bld 91 70 - 99 mg/dL   BUN 17 6 - 20 mg/dL   Creatinine, Ser 0.96 0.44 - 1.00 mg/dL   Calcium 8.7 (L) 8.9 - 10.3 mg/dL   GFR, Estimated >60 >60 mL/min   Anion gap 9 5 - 15  CBC  Result Value Ref Range   WBC 9.9 4.0 - 10.5 K/uL   RBC 4.35 3.87 - 5.11 MIL/uL   Hemoglobin 9.9 (L) 12.0 - 15.0 g/dL   HCT 32.9 (L) 36.0 - 46.0 %   MCV 75.6 (L) 80.0 - 100.0 fL   MCH 22.8 (L) 26.0 - 34.0 pg   MCHC 30.1 30.0 - 36.0 g/dL   RDW 14.9 11.5 - 15.5 %   Platelets 250 150 - 400 K/uL   nRBC 0.0 0.0 - 0.2 %  Brain natriuretic peptide  Result Value Ref Range   B Natriuretic Peptide 2,283.0 (H) 0.0 - 100.0 pg/mL  Magnesium  Result Value Ref Range   Magnesium 1.6 (L) 1.7 - 2.4 mg/dL  CBC WITH DIFFERENTIAL  Result Value Ref Range    WBC 9.6 4.0 - 10.5 K/uL   RBC 4.53 3.87 - 5.11 MIL/uL   Hemoglobin 10.2 (L) 12.0 - 15.0 g/dL   HCT 33.6 (L) 36.0 - 46.0 %   MCV 74.2 (L) 80.0 - 100.0 fL   MCH 22.5 (L) 26.0 - 34.0 pg   MCHC 30.4 30.0 - 36.0 g/dL   RDW 14.6 11.5 - 15.5 %   Platelets 265 150 - 400 K/uL   nRBC 0.0 0.0 - 0.2 %   Neutrophils Relative % 65 %   Neutro Abs 6.3 1.7 - 7.7 K/uL   Lymphocytes Relative 27 %   Lymphs Abs 2.6 0.7 - 4.0 K/uL   Monocytes Relative 5 %   Monocytes Absolute 0.5 0.1 - 1.0 K/uL   Eosinophils Relative 1 %   Eosinophils Absolute 0.1 0.0 - 0.5 K/uL   Basophils Relative 1 %   Basophils Absolute 0.1 0.0 - 0.1 K/uL   Immature Granulocytes 1 %   Abs Immature Granulocytes 0.08 (H) 0.00 - 0.07 K/uL  Comprehensive metabolic panel  Result Value Ref  Range   Sodium 139 135 - 145 mmol/L   Potassium 3.0 (L) 3.5 - 5.1 mmol/L   Chloride 98 98 - 111 mmol/L   CO2 31 22 - 32 mmol/L   Glucose, Bld 95 70 - 99 mg/dL   BUN 17 6 - 20 mg/dL   Creatinine, Ser 0.96 0.44 - 1.00 mg/dL   Calcium 8.7 (L) 8.9 - 10.3 mg/dL   Total Protein 7.2 6.5 - 8.1 g/dL   Albumin 3.3 (L) 3.5 - 5.0 g/dL   AST 18 15 - 41 U/L   ALT 14 0 - 44 U/L   Alkaline Phosphatase 62 38 - 126 U/L   Total Bilirubin 0.9 0.3 - 1.2 mg/dL   GFR, Estimated >60 >60 mL/min   Anion gap 10 5 - 15  Glucose, capillary  Result Value Ref Range   Glucose-Capillary 91 70 - 99 mg/dL  Glucose, capillary  Result Value Ref Range   Glucose-Capillary 107 (H) 70 - 99 mg/dL  Glucose, capillary  Result Value Ref Range   Glucose-Capillary 152 (H) 70 - 99 mg/dL  POC urine preg, ED  Result Value Ref Range   Preg Test, Ur NEGATIVE NEGATIVE  ECHOCARDIOGRAM COMPLETE  Result Value Ref Range   Weight 3,668.45 oz   Height 66 in   BP 123/73 mmHg  Troponin I (High Sensitivity)  Result Value Ref Range   Troponin I (High Sensitivity) 14 <18 ng/L  Troponin I (High Sensitivity)  Result Value Ref Range   Troponin I (High Sensitivity) 16 <18 ng/L   C. Wynetta Emery,  MD [facility]   09/10/2020  4:50 PM How to contact the [facility] Attending or Consulting provider [place] or covering provider during after hours [place], for this patient?  1. Check the care team in [facility] and look for a) attending/consulting TR[facility] provider listed and b) the [facility] team listed 2. Log into [email or web address] and use [place]'s universal password to access. If you do not have the password, please contact the hospital operator. 3. Locate the [facility] provider you are looking for under [facility] and page to a number that you can be directly reached. 4. If you still have difficulty reaching the provider, please page the [facility] (Director on Call) for the Hospitalists listed on amion for assistance.

## 2020-09-11 NOTE — Consult Note (Signed)
Cardiology Consultation:   Patient ID: Chelsey Santiago MRN: [id number]; DOB: 10-09-1975  Admit date: 09/10/2020 Date of Consult: 09/11/2020  PCP:  Rosita Fire, [place]  Cardiologist:  Carlyle Dolly, MD   Patient Profile:   Chelsey Santiago is a 45 y.o. female with past medical history of chronic combined systolic and diastolic CHF/NICM (EF [id number] by echo in 12/2015 with cath showing normal cors, EF at 45% by repeat echo in 10/2018, at 30-35% in 03/2020), HTN, IDDM and OSA who is being seen today for the evaluation of CHF and Hypertensive Urgency at the request of Dr. Wynetta Emery.   History of Present Illness:   Ms. Fixico was examined by the cardiology service in 03/2020 during an admission for an acute CHF exacerbation. She had missed several of her previous appointments but reported she was compliant with medical therapy. She had presented in hypertensive urgency and blood pressure normalized with restarting her home medications. She canceled or no showed for several visits afterwards.  She called the office on 09/04/2020 reporting worsening dyspnea with associated weight gain and had been without her Torsemide for 7 days. A follow-up visit was arranged with Katina Dung, NP on 09/05/2020 and she reported worsening facial swelling and abdominal distention. Her weight was at 236 lbs and BP at 138/88.  She was restarted on Torsemide 40 mg twice daily along with being continued on Entresto, Coreg, Hydralazine and Amlodipine. A repeat echocardiogram was also ordered for reassessment of her EF.  She presented to the ED on 09/10/2020 for evaluation of worsening chest pressure and coughing for the past 3 days. Reports she did start taking Torsemide after her recent office visit but did not experience frequent urination with this. Continued to have worsening dyspnea on exertion and abdominal distension. Also reports orthopnea and PND. Was experiencing chest pressure  yesterday but this has since resolved. Says she never followed-up with Pulmonology in regards to her OSA as she canceled her visit this winter due to the weather and never rescheduled.   Initial labs this admission show WBC 9.9, Hgb 9.9, platelets 250, Na+ 137, K+ 3.3 and creatinine 0.96. BNP 2283. Initial and delta Hs Troponin negative at 14 and 16. Mg 1.6. COVID negative. CXR with cardiomegaly and vascular congestion. EKG shows NSR, HR 86 with LVH and associated repol abnormalities. Received IV Hydralazine 10mg  and IV Labetalol 20mg  while in the ED. Also started on IV NTG drip.   She has been started on IV Lasix 60 mg twice daily for diuresis along with being resumed on her PTA Amlodipine 10 mg daily, Coreg 50 mg twice daily, Hydralazine 100 mg 3 times daily and Entresto 97-103 mg twice daily (scheduled to receive later this morning). She has a recorded output of -3.45 L thus far and weight recorded at 229 lbs.    Past Medical History:  Diagnosis Date  . Anemia    H&H of 10.6/33 and 07/2008 and 11.9/35 and 09/2010  . Anxiety   . Chronic combined systolic and diastolic CHF (congestive heart failure) (HCC)    a. EF 40-45% by echo in 12/2015 with cath showing normal cors b. EF at 45% by repeat echo in 10/2018 c. EF at 30-35% in 03/2020  . Depression with anxiety   . Diabetes mellitus without complication ([place])   . Hypertension   . Hypertensive heart disease 2009   Pulmonary edema postpartum; mild to moderate mitral regurgitation when hospitalized for CHF in 2009; Echocardiogram in 12/2009-no MR  and normal EF; normal CXR in 09/2010  . Migraine headache   . Miscarriage 03/19/2013  . Obesity 04/16/2009  . Osteoarthritis, knee 03/29/2011  . Preeclampsia   . Pulmonary edema   . Sleep apnea   . Threatened abortion in early pregnancy 03/15/2013    Past Surgical History:  Procedure Laterality Date  . BREAST REDUCTION SURGERY  2002  . CARDIAC CATHETERIZATION N/A 12/22/2015   Procedure: Left Heart Cath  and Coronary Angiography;  Surgeon: Peter M Martinique, MD;  Location: [facility];  Service: Cardiovascular;  Laterality: N/A;  . CESAREAN SECTION N/A 04/09/2014   Procedure: CESAREAN SECTION;  Surgeon: Mora Bellman, MD;  Location: [facility];  Service: Obstetrics;  Laterality: N/A;  . CHOLECYSTECTOMY    . HYSTERECTOMY ABDOMINAL WITH SALPINGECTOMY N/A 09/26/2019   Procedure: HYSTERECTOMY ABDOMINAL WITH SALPINGECTOMY;  Surgeon: Florian Buff, MD;  Location: [facility];  Service: Gynecology;  Laterality: N/A;  . LIPOMA EXCISION Right 09/26/2019   Procedure: EXCISION LIPOMA RIGHT VULVAR;  Surgeon: Florian Buff, MD;  Location: [facility];  Service: Gynecology;  Laterality: Right;     Home Medications:  Prior to Admission medications   Medication Sig Start Date End Date Taking? Authorizing Provider  albuterol (VENTOLIN HFA) 108 (90 Base) MCG/ACT inhaler Inhale 1 puff into the lungs every 6 (six) hours as needed for wheezing or shortness of breath. 03/30/20  Yes Kathie Dike, MD  amLODipine (NORVASC) 10 MG tablet Take 1 tablet (10 mg total) by mouth daily. 09/04/19 03/28/20 Yes Maecyn Panning, Fransisco Hertz, PA-C  ARIPiprazole (ABILIFY) 10 MG tablet Take 1 tablet (10 mg total) by mouth daily. 10/30/18  Yes Barton Dubois, MD  aspirin EC 81 MG tablet Take 81 mg by mouth daily.   Yes [provider]  budesonide-formoterol (SYMBICORT) 80-4.5 MCG/ACT inhaler Inhale 2 puffs into the lungs 2 (two) times daily. 03/30/20  Yes Kathie Dike, MD  carvedilol (COREG) 25 MG tablet Take 2 tablets (50 mg total) by mouth 2 (two) times daily with a meal. 01/23/20  Yes Branch, Alphonse Guild, MD  clonazePAM (KLONOPIN) 0.5 MG tablet Take 0.5 mg by mouth 2 (two) times daily. 12/19/18  Yes [provider]  gabapentin (NEURONTIN) 800 MG tablet Take 800 mg by mouth 3 (three) times daily. 09/01/20  Yes [provider]  hydrALAZINE (APRESOLINE) 100 MG tablet Take 1 tablet (100 mg total) by mouth 3 (three) times daily.  01/23/20  Yes BranchAlphonse Guild, MD  insulin glargine (LANTUS) 100 UNIT/ML injection Inject 30 Units at bedtime into the skin.    Yes [provider]  insulin lispro (HUMALOG) 100 UNIT/ML injection Inject 10-16 Units into the skin 3 (three) times daily before meals.   Yes [provider]  Ipratropium-Albuterol (COMBIVENT RESPIMAT) 20-100 MCG/ACT AERS respimat Inhale 1 puff into the lungs every 6 (six) hours as needed for wheezing or shortness of breath. 05/04/18  Yes Manuella Ghazi, Pratik D, DO  linaclotide Rolan Lipa) 145 MCG CAPS capsule Take 1 capsule (145 mcg total) by mouth daily before breakfast. 03/14/19  Yes Jodi Mourning, Kristen S, PA-C  liraglutide (VICTOZA) 18 MG/3ML SOPN Inject 1.2 mg into the skin daily. 09/04/20  Yes Reardon, Juanetta Beets, NP  nitroGLYCERIN (NITROSTAT) 0.4 MG SL tablet Place 1 tablet (0.4 mg total) under the tongue every 5 (five) minutes as needed for chest pain. 09/05/15  Yes Kathie Dike, MD  ondansetron (ZOFRAN ODT) 8 MG disintegrating tablet Take 1 tablet (8 mg total) by mouth every 8 (eight) hours as  needed for nausea or vomiting. 09/06/19  Yes Long, Wonda Olds, MD  potassium chloride SA (KLOR-CON M20) 20 MEQ tablet Take 2 tablets (40 mEq total) by mouth daily. 09/04/19 03/28/20 Yes Myer Bohlman, Fransisco Hertz, PA-C  pyridostigmine (MESTINON) 60 MG tablet Take 60 mg by mouth every 8 (eight) hours.   Yes [provider]  sacubitril-valsartan (ENTRESTO) 97-103 MG Take 1 tablet by mouth 2 (two) times daily. 03/16/19  Yes BranchAlphonse Guild, MD  torsemide (DEMADEX) 20 MG tablet Take 2 tablets (40 mg total) by mouth 2 (two) times daily. 09/05/20  Yes Verta Ellen., NP  traZODone (DESYREL) 100 MG tablet Take 2 tablets (200 mg total) by mouth at bedtime. 10/29/18  Yes Barton Dubois, MD  ACCU-CHEK GUIDE test strip USE AS DIRECTED TO TESTCBLOOD SUGAR TWICE DAILY. 06/11/20   [provider]  dextromethorphan-guaiFENesin (MUCINEX DM) 30-600 MG 12hr tablet Take 1 tablet  by mouth 2 (two) times daily. Patient not taking: No sig reported 03/30/20   Kathie Dike, MD  triamcinolone ointment (KENALOG) 0.5 % Apply 1 application topically 2 (two) times daily. Patient not taking: Reported on 09/10/2020 12/19/19   Estill Dooms, NP    Inpatient Medications: Scheduled Meds: . amLODipine  10 mg Oral Daily  . ARIPiprazole  10 mg Oral Daily  . aspirin EC  81 mg Oral Daily  . carvedilol  50 mg Oral BID WC  . clonazePAM  0.5 mg Oral BID  . furosemide  60 mg Intravenous Q12H  . gabapentin  800 mg Oral TID  . heparin  5,000 Units Subcutaneous Q8H  . hydrALAZINE  100 mg Oral TID  . insulin aspart  0-15 Units Subcutaneous TID WC  . insulin aspart  0-5 Units Subcutaneous QHS  . insulin glargine  22 Units Subcutaneous QHS  . mometasone-formoterol  2 puff Inhalation BID  . potassium chloride  40 mEq Oral Once  . pyridostigmine  60 mg Oral Q8H  . sacubitril-valsartan  1 tablet Oral BID  . traZODone  200 mg Oral QHS   Continuous Infusions: . magnesium sulfate bolus IVPB    . nitroGLYCERIN 52.5 mcg/min (09/11/20 0805)   PRN Meds: acetaminophen **OR** acetaminophen, albuterol, ondansetron **OR** ondansetron (ZOFRAN) IV, oxyCODONE, polyethylene glycol  Allergies:    Allergies  Allergen Reactions  . Diclofenac Swelling    AND POSSIBLE SYNCOPE; tolerates ibuprofen per pt  . Tramadol Nausea And Vomiting and Nausea Only    Itching (12/21); tolerates ibuprofen per pt  . Vicodin [Hydrocodone-Acetaminophen] Itching and Nausea Only    Social History:   Social History   Socioeconomic History  . Marital status: Married    Spouse name: Not on file  . Number of children: 6  . Years of education: Not on file  . Highest education level: Not on file  Occupational History  . Occupation: unemployed    Fish farm manager: UNEMPLOYED  Tobacco Use  . Smoking status: Never Smoker  . Smokeless tobacco: Never Used  Vaping Use  . Vaping Use: Never used  Substance and Sexual  Activity  . Alcohol use: Yes    Comment: occ  . Drug use: No  . Sexual activity: Yes    Birth control/protection: Surgical    Comment: hyst  Other Topics Concern  . Not on file  Social History Narrative   Lives in [place]   Engaged/boyfriend (father to youngest chid)   4 children: daughter (3 as of 2013), sons (15, 39, 74 as of 2013)   Religion:  christian   Social Determinants of Health   Financial Resource Strain: Not on file  Food Insecurity: Not on file  Transportation Needs: Not on file  Physical Activity: Not on file  Stress: Not on file  Social Connections: Not on file  Intimate Partner Violence: Not on file    Family History:    Family History  Problem Relation Age of Onset  . Diabetes Mother 23  . Heart disease Mother   . Hyperlipidemia Paternal Grandfather   . Hypertension Paternal Grandfather   . Heart disease Father   . Hypertension Father   . Heart disease Maternal Grandmother 60  . ADD / ADHD Son   . Hypertension Maternal Uncle   . Heart attack Brother   . Sudden death Neg Hx   . Colon cancer Neg Hx   . Celiac disease Neg Hx   . Inflammatory bowel disease Neg Hx      ROS:  Please see the history of present illness.   All other ROS reviewed and negative.     Physical Exam/Data:   Vitals:   09/11/20 0730 09/11/20 0745 09/11/20 0800 09/11/20 0815  BP: (!) 168/100  (!) 179/96 139/80  Pulse: 80 82 85 81  Resp: (!) 22 (!) 29 (!) 26 20  Temp:      TempSrc:      SpO2: 100% 100% 100% 99%  Weight:      Height:        Intake/Output Summary (Last 24 hours) at 09/11/2020 0858 Last data filed at 09/10/2020 2217 Gross per 24 hour  Intake --  Output 3450 ml  Net -3450 ml   Last 3 Weights 09/11/2020 09/10/2020 09/05/2020  Weight (lbs) 229 lb 4.5 oz 238 lb 236 lb  Weight (kg) 104 kg 107.956 kg 107.049 kg     Body mass index is 37.01 kg/m.  General:  Pleasant obese female appearing in no acute distress.  HEENT: normal Lymph: no adenopathy Neck: no  JVD Endocrine:  No thryomegaly Vascular: No carotid bruits; FA pulses 2+ bilaterally without bruits  Cardiac:  normal S1, S2; RRR; no murmur Lungs:  clear to auscultation bilaterally, rales along bases bilaterally. Abd: soft, appears mildly distended.  Ext: no lower extremity edema Musculoskeletal:  No deformities, BUE and BLE strength normal and equal Skin: warm and dry  Neuro:  CNs 2-12 intact, no focal abnormalities noted Psych:  Normal affect   EKG:  The EKG was personally reviewed and demonstrates: NSR, HR 86 with LVH and associated repol abnormalities.   Telemetry:  Telemetry was personally reviewed and demonstrates: NSR, HR in 70's to 80's.   Relevant CV Studies:  Cardiac Catheterization: 12/2015  The left ventricular systolic function is normal.  LV end diastolic pressure is mildly elevated.  The left ventricular ejection fraction is 50-55% by visual estimate.   1. No significant CAD 2. Good LV function 3. Mildly elevated LV EDP.  Plan: medical therapy  Echocardiogram: 03/2020 IMPRESSIONS    1. Left ventricular ejection fraction, by estimation, is 30 to 35%. The  left ventricle has moderately decreased function. The left ventricle  demonstrates global hypokinesis. There is moderate left ventricular  hypertrophy. Left ventricular diastolic  parameters are consistent with Grade II diastolic dysfunction  (pseudonormalization).  2. Right ventricular systolic function is normal. The right ventricular  size is normal. There is normal pulmonary artery systolic pressure. The  estimated right ventricular systolic pressure is 23.5 mmHg.  3. Left atrial size was moderately dilated.  4.  The mitral valve is grossly normal. Trivial mitral valve  regurgitation.  5. The aortic valve is tricuspid. There is mild calcification of the  aortic valve. Aortic valve regurgitation is not visualized. Mild aortic  valve sclerosis is present, with no evidence of aortic valve  stenosis.  6. The inferior vena cava is normal in size with greater than 50%  respiratory variability, suggesting right atrial pressure of 3 mmHg.  Laboratory Data:  High Sensitivity Troponin:   Recent Labs  Lab 08/26/20 0115 08/30/20 2246 08/31/20 0155 09/10/20 1803 09/10/20 2007  TROPONINIHS 23* 21* 22* 14 16     Chemistry Recent Labs  Lab 09/10/20 1803 09/11/20 0544  NA 137 139  K 3.3* 3.0*  CL 100 98  CO2 28 31  GLUCOSE 91 95  BUN 17 17  CREATININE 0.96 0.96  CALCIUM 8.7* 8.7*  GFRNONAA >60 >60  ANIONGAP 9 10    Recent Labs  Lab 09/11/20 0544  PROT 7.2  ALBUMIN 3.3*  AST 18  ALT 14  ALKPHOS 62  BILITOT 0.9   Hematology Recent Labs  Lab 09/10/20 1803 09/11/20 0544  WBC 9.9 9.6  RBC 4.35 4.53  HGB 9.9* 10.2*  HCT 32.9* 33.6*  MCV 75.6* 74.2*  MCH 22.8* 22.5*  MCHC 30.1 30.4  RDW 14.9 14.6  PLT 250 265   BNP Recent Labs  Lab 09/10/20 1805  BNP 2,283.0*    DDimer No results for input(s): DDIMER in the last 168 hours.   Radiology/Studies:  DG Chest Portable 1 View  Result Date: 09/10/2020 CLINICAL DATA:  45 year old female with chest pain and cough. EXAM: PORTABLE CHEST 1 VIEW COMPARISON:  Chest radiograph dated 08/30/2020. FINDINGS: There is cardiomegaly with vascular congestion. No focal consolidation, pleural effusion, or pneumothorax. No acute osseous pathology. IMPRESSION: Cardiomegaly with vascular congestion. No focal consolidation. Electronically Signed   By: Anner Crete M.D.   On: 09/10/2020 17:58     Assessment and Plan:   1. Acute on Chronic Combined Systolic and Diastolic CHF/NICM - She has a known NICM with EF at 40-45% by echo in 12/2015 with cath showing normal cors and most recent echo in 03/2020 showed her EF was at 30-35% in 03/2020. - Had been without her Torsemide for over a week and recently restarted this 5 days prior to admission but was experiencing minimal urination.  - BNP at 2283 and CXR showing cardiomegaly  and vascular congestion. She is scheduled for a repeat outpatient echo next month and will order for this admission.  - She is currently on IV Lasix 60 mg twice daily for diuresis and is responding well with a recorded output of -3.45 L thus far and weight recorded at 229 lbs. Was at 224 lbs in 03/2020. Follow I&O's along with daily weights. Repeat BMET in AM.  - She remains on Coreg 50 mg twice daily, Hydralazine 100 mg TID and Entresto 97-103 mg twice daily. Would recommend adding an SGLT-2 inhibitor prior to discharge if EF remains reduced. Could also consider adding Spironolactone pending BP trend.  2. Hypertensive Urgency - BP was at 2000/128 while in the ED and she received IV Hydralazine 10mg  and IV Labetalol 20mg  while in the ED. Also started on IV NTG drip. Still on IV NTG at 52.5 mcg/min. Reviewed with patient's nurse in regards to gradually reducing this today as we restart her home medications. Already received Coreg 50mg  and scheduled to receive Amlodipine, Hydralazine and Entresto later today. If additional BP control is needed  despite resuming her home medications, would consider adding Spironolactone in the setting of her cardiomyopathy.   3. Type 2 DM  - Hgb A1c at 9.8 in 08/2020. Further management per the admitting team.   4. Hypokalemia/Hypomagnesemia - K+ at 3.0 this AM despite already receiving replacement. Will order additional 40 mEq for this AM. Mg low at 1.6 and will order replacement. Keep K+ at 4 and Mg at 2.  5. OSA - She does experience desaturations at night and was encouraged to reschedule follow-up with Pulmonology as an outpatient so a repeat sleep study can be arranged.   For questions or updates, please contact [place] Please consult [email or web address] for contact info under    Signed, Erma Heritage, PA-C  09/11/2020 8:58 AM

## 2020-09-11 NOTE — Progress Notes (Signed)
  Echocardiogram 2D Echocardiogram has been performed.  Jennette Dubin 09/11/2020, 10:44 AM

## 2020-09-12 DIAGNOSIS — I161 Hypertensive emergency: Secondary | ICD-10-CM

## 2020-09-12 LAB — BASIC METABOLIC PANEL
Anion gap: 8 (ref 5–15)
BUN: 15 mg/dL (ref 6–20)
CO2: 29 mmol/L (ref 22–32)
Calcium: 8.6 mg/dL — ABNORMAL LOW (ref 8.9–10.3)
Chloride: 101 mmol/L (ref 98–111)
Creatinine, Ser: 0.89 mg/dL (ref 0.44–1.00)
GFR, Estimated: >60 mL/min (ref >60)
Glucose, Bld: 108 mg/dL — ABNORMAL HIGH (ref 70–99)
Potassium: 3.4 mmol/L — ABNORMAL LOW (ref 3.5–5.1)
Sodium: 138 mmol/L (ref 135–145)

## 2020-09-12 LAB — GLUCOSE, CAPILLARY
Glucose-Capillary: 166 mg/dL — ABNORMAL HIGH (ref 70–99)
Glucose-Capillary: 220 mg/dL — ABNORMAL HIGH (ref 70–99)
Glucose-Capillary: 142 mg/dL — ABNORMAL HIGH (ref 70–99)
Glucose-Capillary: 181 mg/dL — ABNORMAL HIGH (ref 70–99)

## 2020-09-12 LAB — CBC WITH DIFFERENTIAL/PLATELET
Abs Immature Granulocytes: 0.05 10*3/uL (ref 0.00–0.07)
Basophils Absolute: 0.1 10*3/uL (ref 0.0–0.1)
Basophils Relative: 1 %
Eosinophils Absolute: 0.1 10*3/uL (ref 0.0–0.5)
Eosinophils Relative: 2 %
HCT: 35.1 % — ABNORMAL LOW (ref 36.0–46.0)
Hemoglobin: 10.7 g/dL — ABNORMAL LOW (ref 12.0–15.0)
Immature Granulocytes: 1 %
Lymphocytes Relative: 22 %
Lymphs Abs: 2 10*3/uL (ref 0.7–4.0)
MCH: 23 pg — ABNORMAL LOW (ref 26.0–34.0)
MCHC: 30.5 g/dL (ref 30.0–36.0)
MCV: 75.5 fL — ABNORMAL LOW (ref 80.0–100.0)
Monocytes Absolute: 0.7 10*3/uL (ref 0.1–1.0)
Monocytes Relative: 7 %
Neutro Abs: 6.3 10*3/uL (ref 1.7–7.7)
Neutrophils Relative %: 67 %
Platelets: 254 10*3/uL (ref 150–400)
RBC: 4.65 MIL/uL (ref 3.87–5.11)
RDW: 14.9 % (ref 11.5–15.5)
WBC: 9.2 10*3/uL (ref 4.0–10.5)
nRBC: 0 % (ref 0.0–0.2)

## 2020-09-12 LAB — BRAIN NATRIURETIC PEPTIDE: B Natriuretic Peptide: 388 pg/mL — ABNORMAL HIGH (ref 0.0–100.0)

## 2020-09-12 LAB — MAGNESIUM: Magnesium: 1.9 mg/dL (ref 1.7–2.4)

## 2020-09-12 MED ORDER — POTASSIUM CHLORIDE CRYS ER 20 MEQ PO TBCR
40.0000 meq | EXTENDED_RELEASE_TABLET | Freq: Every day | ORAL | Status: DC
  Administered 2020-09-12 – 2020-09-13 (×2): 40 meq via ORAL
  Filled 2020-09-12 (×3): qty 2

## 2020-09-12 MED ORDER — CHLORHEXIDINE GLUCONATE CLOTH 2 % EX PADS
6.0000 | MEDICATED_PAD | Freq: Every day | CUTANEOUS | Status: DC
  Administered 2020-09-12: 6 via TOPICAL

## 2020-09-12 MED ORDER — CHLORHEXIDINE GLUCONATE CLOTH 2 % EX PADS: 6.0000 | MEDICATED_PAD | Freq: Every day | CUTANEOUS | Status: DC

## 2020-09-12 MED ORDER — SPIRONOLACTONE 12.5 MG HALF TABLET
12.5000 mg | ORAL_TABLET | Freq: Every day | ORAL | Status: DC
  Administered 2020-09-12 – 2020-09-13 (×2): 12.5 mg via ORAL
  Filled 2020-09-12 (×4): qty 1

## 2020-09-12 MED ORDER — INSULIN ASPART 100 UNIT/ML IJ SOLN
4.0000 [IU] | Freq: Three times a day (TID) | INTRAMUSCULAR | Status: DC
  Administered 2020-09-12 – 2020-09-13 (×4): 4 [IU] via SUBCUTANEOUS

## 2020-09-12 MED ORDER — MUPIROCIN 2 % EX OINT
1.0000 "application " | TOPICAL_OINTMENT | Freq: Two times a day (BID) | CUTANEOUS | Status: DC
  Administered 2020-09-12 – 2020-09-13 (×3): 1 via NASAL
  Filled 2020-09-12 (×2): qty 22

## 2020-09-12 MED ORDER — GUAIFENESIN-DM 100-10 MG/5ML PO SYRP
5.0000 mL | ORAL_SOLUTION | ORAL | Status: DC | PRN
  Administered 2020-09-12: 5 mL via ORAL
  Filled 2020-09-12: qty 5

## 2020-09-12 NOTE — Progress Notes (Signed)
Patient became agitated when explained to her she couldn't have her bojangles due to specific dietary and fluid restrictions that she is on because of her CHF. I asked charge nurse to come in and speak to patient regarding this.

## 2020-09-12 NOTE — Progress Notes (Addendum)
Progress Note  Patient Name: Chelsey Santiago Date of Encounter: 09/12/2020  Primary Cardiologist: Carlyle Dolly, MD  Subjective   Breathing more comfortably, no chest pain or sense of palpitations.  Has noticed increased urine output on diuretics.  Inpatient Medications    Scheduled Meds: . amLODipine  10 mg Oral Daily  . ARIPiprazole  10 mg Oral Daily  . aspirin EC  81 mg Oral Daily  . carvedilol  50 mg Oral BID WC  . Chlorhexidine Gluconate Cloth  6 each Topical Daily  . clonazePAM  0.5 mg Oral BID  . furosemide  60 mg Intravenous Q12H  . gabapentin  800 mg Oral TID  . heparin  5,000 Units Subcutaneous Q8H  . hydrALAZINE  100 mg Oral TID  . insulin aspart  0-15 Units Subcutaneous TID WC  . insulin aspart  0-5 Units Subcutaneous QHS  . insulin aspart  4 Units Subcutaneous TID WC  . insulin glargine  22 Units Subcutaneous QHS  . mometasone-formoterol  2 puff Inhalation BID  . pantoprazole  40 mg Oral BID  . potassium chloride  40 mEq Oral Daily  . pyridostigmine  60 mg Oral Q8H  . sacubitril-valsartan  1 tablet Oral BID  . traZODone  200 mg Oral QHS   Continuous Infusions: . nitroGLYCERIN Stopped (09/11/20 0932)   PRN Meds: acetaminophen **OR** acetaminophen, albuterol, ondansetron **OR** ondansetron (ZOFRAN) IV, oxyCODONE, polyethylene glycol   Vital Signs    Vitals:   09/12/20 0000 09/12/20 0400 09/12/20 0700 09/12/20 0745  BP:      Pulse:      Resp:      Temp: 97.6 F (36.4 C) 97.9 F (36.6 C) 97.6 F (36.4 C)   TempSrc: Oral Oral Oral   SpO2:    97%  Weight:      Height:        Intake/Output Summary (Last 24 hours) at 09/12/2020 0830 Last data filed at 09/12/2020 0500 Gross per 24 hour  Intake --  Output 1300 ml  Net -1300 ml   Filed Weights   09/10/20 1654 09/11/20 0137  Weight: 108 kg 104 kg    Telemetry    Sinus rhythm with rare PVCs.  Personally reviewed.  ECG    An ECG dated 09/10/2020 was personally reviewed today and  demonstrated:  Sinus rhythm with LVH and IVCD.  Physical Exam   GEN: No acute distress.   Neck: No JVD. Cardiac: RRR, no murmur or gallop.  Respiratory: Nonlabored.  Decreased breath sounds with few crackles at the bases. GI: Soft, nontender, bowel sounds present. MS: No edema; No deformity. Neuro:  Nonfocal. Psych: Alert and oriented x 3. Normal affect.  Labs    Chemistry Recent Labs  Lab 09/10/20 1803 09/11/20 0544 09/12/20 0434  NA 137 139 138  K 3.3* 3.0* 3.4*  CL 100 98 101  CO2 28 31 29   GLUCOSE 91 95 108*  BUN 17 17 15   CREATININE 0.96 0.96 0.89  CALCIUM 8.7* 8.7* 8.6*  PROT  --  7.2  --   ALBUMIN  --  3.3*  --   AST  --  18  --   ALT  --  14  --   ALKPHOS  --  62  --   BILITOT  --  0.9  --   GFRNONAA >60 >60 >60  ANIONGAP 9 10 8      Hematology Recent Labs  Lab 09/10/20 1803 09/11/20 0544 09/12/20 0434  WBC 9.9 9.6 9.2  RBC 4.35 4.53 4.65  HGB 9.9* 10.2* 10.7*  HCT 32.9* 33.6* 35.1*  MCV 75.6* 74.2* 75.5*  MCH 22.8* 22.5* 23.0*  MCHC 30.1 30.4 30.5  RDW 14.9 14.6 14.9  PLT 250 265 254    Cardiac Enzymes Recent Labs  Lab 08/26/20 0115 08/30/20 2246 08/31/20 0155 09/10/20 1803 09/10/20 2007  TROPONINIHS 23* 21* 22* 14 16    BNP Recent Labs  Lab 09/10/20 1805 09/12/20 0434  BNP 2,283.0* 388.0*     Radiology    DG Chest Portable 1 View  Result Date: 09/10/2020 CLINICAL DATA:  45 year old female with chest pain and cough. EXAM: PORTABLE CHEST 1 VIEW COMPARISON:  Chest radiograph dated 08/30/2020. FINDINGS: There is cardiomegaly with vascular congestion. No focal consolidation, pleural effusion, or pneumothorax. No acute osseous pathology. IMPRESSION: Cardiomegaly with vascular congestion. No focal consolidation. Electronically Signed   By: Anner Crete M.D.   On: 09/10/2020 17:58   ECHOCARDIOGRAM COMPLETE  Result Date: 09/11/2020    ECHOCARDIOGRAM REPORT   Patient Name:   Chelsey Santiago Date of Exam: 09/11/2020 Medical Rec #:   [id number]       Height:       66.0 in Accession #:    [id number]      Weight:       229.3 lb Date of Birth:  [birth year]       BSA:          2.119 m Patient Age:    45 years        BP:           123/73 mmHg Patient Gender: F               HR:           74 bpm. Exam Location:  [facility] Procedure: 2D Echo Indications:    CHF- Acute Systolic B15.17  History:        Patient has prior history of Echocardiogram examinations, most                 recent 03/28/2020. Risk Factors:Hypertension and Diabetes.  Sonographer:    Mikki Santee RDCS (AE) Referring Phys: [id number] [place]  1. Left ventricular ejection fraction, by estimation, is approximately 35%. The left ventricle has moderately decreased function. The left ventricle demonstrates global hypokinesis. There is moderate asymmetric left ventricular hypertrophy of the posterior segment. Left ventricular diastolic parameters are consistent with Grade II diastolic dysfunction (pseudonormalization).  2. Right ventricular systolic function is normal. The right ventricular size is normal. There is mildly elevated pulmonary artery systolic pressure. The estimated right ventricular systolic pressure is 10.6 mmHg.  3. Left atrial size was severely dilated.  4. Right atrial size was moderately dilated.  5. A small pericardial effusion is present. The pericardial effusion is posterior to the left ventricle and lateral to the left ventricle.  6. The mitral valve is grossly normal. Trivial mitral valve regurgitation.  7. The aortic valve is tricuspid. Aortic valve regurgitation is not visualized. Mild to moderate aortic valve sclerosis/calcification is present, without any evidence of aortic stenosis. Aortic valve mean gradient measures 9.0 mmHg.  8. The inferior vena cava is dilated in size with <50% respiratory variability, suggesting right atrial pressure of 15 mmHg. FINDINGS  Left Ventricle: Left ventricular ejection fraction, by estimation, is 35%.  The left ventricle has moderately decreased function. The left ventricle demonstrates global hypokinesis. The left ventricular internal cavity size was normal in size. There is moderate asymmetric  left ventricular hypertrophy of the posterior segment. Left ventricular diastolic parameters are consistent with Grade II diastolic dysfunction (pseudonormalization). Right Ventricle: The right ventricular size is normal. No increase in right ventricular wall thickness. Right ventricular systolic function is normal. There is mildly elevated pulmonary artery systolic pressure. The tricuspid regurgitant velocity is 2.67  m/s, and with an assumed right atrial pressure of 15 mmHg, the estimated right ventricular systolic pressure is [id number]. Left Atrium: Left atrial size was severely dilated. Right Atrium: Right atrial size was moderately dilated. Pericardium: A small pericardial effusion is present. The pericardial effusion is posterior to the left ventricle and lateral to the left ventricle. Mitral Valve: The mitral valve is grossly normal. Mild mitral annular calcification. Trivial mitral valve regurgitation. Tricuspid Valve: The tricuspid valve is grossly normal. Tricuspid valve regurgitation is mild. Aortic Valve: The aortic valve is tricuspid. There is moderate aortic valve annular calcification. Aortic valve regurgitation is not visualized. Mild to moderate aortic valve sclerosis/calcification is present, without any evidence of aortic stenosis. Aortic valve mean gradient measures 9.0 mmHg. Aortic valve peak gradient measures 15.8 mmHg. Aortic valve area, by VTI measures 1.91 cm. Pulmonic Valve: The pulmonic valve was grossly normal. Pulmonic valve regurgitation is trivial. Aorta: The aortic root is normal in size and structure. Venous: The inferior vena cava is dilated in size with less than 50% respiratory variability, suggesting right atrial pressure of 15 mmHg. IAS/Shunts: No atrial level shunt detected by color  flow Doppler.  LEFT VENTRICLE PLAX 2D LVIDd:         5.44 cm      Diastology LVIDs:         4.42 cm      LV e' medial:    3.53 cm/s LV PW:         1.63 cm      LV E/e' medial:  30.0 LV IVS:        1.17 cm      LV e' lateral:   3.60 cm/s LVOT diam:     2.10 cm      LV E/e' lateral: 29.4 LV SV:         66 LV SV Index:   31 LVOT Area:     3.46 cm  LV Volumes (MOD) LV vol d, MOD A4C: 158.0 ml LV vol s, MOD A4C: 87.0 ml LV SV MOD A4C:     158.0 ml RIGHT VENTRICLE RV S prime:     10.90 cm/s TAPSE (M-mode): 1.9 cm LEFT ATRIUM              Index       RIGHT ATRIUM           Index LA diam:        4.80 cm  2.26 cm/m  RA Area:     30.50 cm LA Vol (A2C):   107.0 ml 50.49 ml/m RA Volume:   98.80 ml  46.62 ml/m LA Vol (A4C):   120.0 ml 56.62 ml/m LA Biplane Vol: 113.0 ml 53.32 ml/m  AORTIC VALVE AV Area (Vmax):    1.61 cm AV Area (Vmean):   1.82 cm AV Area (VTI):     1.91 cm AV Vmax:           199.00 cm/s AV Vmean:          142.000 cm/s AV VTI:            0.347 m AV Peak Grad:      15.8  mmHg AV Mean Grad:      9.0 mmHg LVOT Vmax:         92.30 cm/s LVOT Vmean:        74.800 cm/s LVOT VTI:          0.191 m LVOT/AV VTI ratio: 0.55  AORTA Ao Root diam: 2.80 cm MITRAL VALVE                TRICUSPID VALVE MV Area (PHT): 3.48 cm     TR Peak grad:   28.5 mmHg MV Decel Time: 218 msec     TR Vmax:        267.00 cm/s MV E velocity: 106.00 cm/s MV A velocity: 63.80 cm/s   SHUNTS MV E/A ratio:  1.66         Systemic VTI:  0.19 m                             Systemic Diam: 2.10 cm Rozann Lesches MD Electronically signed by Rozann Lesches MD Signature Date/Time: 09/11/2020/11:50:01 AM    Final     Patient Profile     45 y.o. female with past medical history of chronic combined systolic and diastolic CHF/NICM(EF 62-26% by echo in 12/2015 with cath showing normal cors, EF at 45% by repeat echo in 10/2018, at 30-35% in 03/2020), HTN,IDDMand OSA now presenting with hypertensive urgency and acute on chronic combined heart  failure.  Assessment & Plan    1.  Acute on chronic combined heart failure with LVEF approximately 35%, moderate asymmetric posterior hypertrophy, moderate diastolic dysfunction, and normal RV contraction with RVSP 44 mmHg.  Clinically improving with IV diuresis total of 4700 cc net out more than in so far.  Renal function stable.  2.  Hypertensive urgency, blood pressure trend improving significantly.  Question compliance with medications as an outpatient.  She has been weaned off IV nitroglycerin.  3.  OSA, not on CPAP.  Needs formal evaluation by Pulmonary, referrals have been made in the past.  4.  Type 2 diabetes mellitus, uncontrolled with hemoglobin A1c 9.8%.  Would consider SGLT2 inhibitor.  Continue Lasix 60 mg IV every 12 hours with potassium supplement.  Also starting Aldactone 12.5 mg daily.  Continue to track urine output and renal function.  Remaining regimen includes Norvasc, Coreg, hydralazine, and Entresto.  With concern about medication compliance, adding further therapies may not be optimal, but she would be a reasonable candidate for SGLT2 inhibitor.  Consider transfer out to telemetry.  Anticipate IV diuresis for the next 24 to 48 hours and then consolidation to oral regimen with potential discharge and close outpatient follow-up.  Signed, Rozann Lesches, MD  09/12/2020, 8:30 AM

## 2020-09-12 NOTE — Progress Notes (Addendum)
At the nurse's request, I went to speak to the patient regarding the fact that she can not order food from outside sources and have it delivered, that she is on a specific diet assigned by her MD.  Patient stated " I don't give a fuck. I want my food in here with me.  I won't eat it right now."  I explained that fast food is extremely high in sodium and she is on a heart health diet with a 1800 fluid restriction to attempt to bring her out of her CHF.  Again patient was rude and stated she did not care and she would leave AMA if we did not let her have her food to eat.  I contacted the [facility] whom also stated that she can not have food brought in from outside that we are not contributing her disobeying of doctor's orders.  Patient continued on about wanting her food in her room, even after being told that she could not have it.  She also became irriate when told she could not have anything else to drink tonight.

## 2020-09-12 NOTE — Progress Notes (Signed)
Report called to Herbert Spires, RN on Dept. 300. Pt transported via wheelchair with Horris Latino, [place]at[place].

## 2020-09-12 NOTE — Progress Notes (Signed)
PROGRESS NOTE   Chelsey Santiago  TDD:[id number] DOB: 1975/08/18 DOA: 09/10/2020 PCP: Rosita Fire, MD   Chief Complaint  Patient presents with  . Chest Pain   Level of care: Telemetry  Brief Admission History:  45 y.o. female, with history of obesity, hypertensive heart disease, hypertension, diabetes mellitus type 2, chronic combined systolic and diastolic CHF, anxiety, and more presents the ED with chief complaint of dyspnea.  Dyspnea started at 4 AM the same day as presentation.  She reports an associated chest pressure in the center of her chest as well.  When the pain started she was diaphoretic and could not sleep.  Dyspnea progressed throughout the day, was worse with exertion, and improved but was not relieved with rest.  She reports a dry cough associated, as well as orthopnea.  She was admitted with acute on chronic systolic heart failure.   Assessment & Plan:   Principal Problem:   Acute CHF ([place]) Active Problems:   Hypokalemia   Hypertensive emergency   Diabetes mellitus with complication ([place])   1. Acute heart failure reduced EF - Pt has notorious poor compliance with taking medication appropriately and prior to this hospitalization had run out of meds for over a week. She has been diuresing well with IV lasix and cardiology has been following with her. Today they added spironolactone.  Continue IV lasix as ordered and other cardiac meds.  2. Hypokalemia - improved, added daily potassium supplement, recheck in AM.  3. Hypertensive urgency - due to poor compliance with meds, resumed home meds, BP better controlled, following.   4. Type 2 diabetes mellitus - BS has been monitored and is holding stable. Following, continue current protocol.   DVT prophylaxis: SCD/SQ heparin  Code Status: Full  Family Communication: care plan discussed with patient at bedside Disposition: home  Status is: Inpatient  Remains inpatient appropriate because:IV treatments appropriate due to  intensity of illness or inability to take PO and Inpatient level of care appropriate due to severity of illness   Dispo: The patient is from: Home              Anticipated d/c is to: Home              Patient currently is not medically stable to d/c.   Difficult to place patient No  Consultants:   Cardiology   Procedures:   n/a  Antimicrobials:  n/a   Subjective: Pt reports that she is urinating frequently and overall her breathing is improved.   Objective: Vitals:   09/12/20 0700 09/12/20 0745 09/12/20 0800 09/12/20 0900  BP:    (!) 145/52  Pulse:   71 66  Resp:   (!) 22 17  Temp: 97.6 F (36.4 C)     TempSrc: Oral     SpO2:  97% 96% 96%  Weight:      Height:        Intake/Output Summary (Last 24 hours) at 09/12/2020 1133 Last data filed at 09/12/2020 0500 Gross per 24 hour  Intake --  Output 900 ml  Net -900 ml   Filed Weights   09/10/20 1654 09/11/20 0137  Weight: 108 kg 104 kg    Examination:  General exam: Appears calm and comfortable  Respiratory system: bibasilar crackles. Respiratory effort normal. Cardiovascular system: normal S1 & S2 heard. No JVD, murmurs, rubs, gallops or clicks. No pedal edema. Gastrointestinal system: Abdomen is nondistended, soft and nontender. No organomegaly or masses felt. Normal bowel sounds heard. Central nervous  system: Alert and oriented. No focal neurological deficits. Extremities: 1-2+ edema BLEs. Symmetric 5 x 5 power. Skin: No rashes, lesions or ulcers Psychiatry: Judgement and insight appear poor. Mood & affect appropriate.   Data Reviewed: I have personally reviewed following labs and imaging studies  CBC: Recent Labs  Lab 09/10/20 1803 09/11/20 0544 09/12/20 0434  WBC 9.9 9.6 9.2  NEUTROABS  --  6.3 6.3  HGB 9.9* 10.2* 10.7*  HCT 32.9* 33.6* 35.1*  MCV 75.6* 74.2* 75.5*  PLT 250 265 751    Basic Metabolic Panel: Recent Labs  Lab 09/10/20 1803 09/11/20 0544 09/12/20 0434  NA 137 139 138  K  3.3* 3.0* 3.4*  CL 100 98 101  CO2 28 31 29   GLUCOSE 91 95 108*  BUN 17 17 15   CREATININE 0.96 0.96 0.89  CALCIUM 8.7* 8.7* 8.6*  MG  --  1.6* 1.9    GFR: Estimated Creatinine Clearance: 97.3 mL/min (by C-G formula based on SCr of 0.89 mg/dL).  Liver Function Tests: Recent Labs  Lab 09/11/20 0544  AST 18  ALT 14  ALKPHOS 62  BILITOT 0.9  PROT 7.2  ALBUMIN 3.3*    CBG: Recent Labs  Lab 09/11/20 0741 09/11/20 1139 09/11/20 1603 09/11/20 2130 09/12/20 0729  GLUCAP 107* 152* 189* 179* 166*    Recent Results (from the past 240 hour(s))  SARS CORONAVIRUS 2 (TAT 6-24 HRS) Nasopharyngeal Nasopharyngeal Swab     Status: None   Collection Time: 09/10/20  4:58 PM   Specimen: Nasopharyngeal Swab  Result Value Ref Range Status   SARS Coronavirus 2 NEGATIVE NEGATIVE Final    Comment: (NOTE) SARS-CoV-2 target nucleic acids are NOT DETECTED.  The SARS-CoV-2 RNA is generally detectable in upper and lower respiratory specimens during the acute phase of infection. Negative results do not preclude SARS-CoV-2 infection, do not rule out co-infections with other pathogens, and should not be used as the sole basis for treatment or other patient management decisions. Negative results must be combined with clinical observations, patient history, and epidemiological information. The expected result is Negative.  Fact Sheet for Patients: SugarRoll.be  Fact Sheet for Healthcare Providers: https://www.woods-mathews.com/  This test is not yet approved or cleared by the [place] FDA and  has been authorized for detection and/or diagnosis of SARS-CoV-2 by FDA under an Emergency Use Authorization (EUA). This EUA will remain  in effect (meaning this test can be used) for the duration of the COVID-19 declaration under Se ction 564(b)(1) of the Act, 21 U.S.C. section 360bbb-3(b)(1), unless the authorization is terminated or revoked  sooner.  Performed at [facility], [place] [address] [place], [place] [address]   Resp Panel by RT-PCR (Flu A&B, Covid) Nasopharyngeal Swab     Status: None   Collection Time: 09/10/20 10:33 PM   Specimen: Nasopharyngeal Swab; Nasopharyngeal(NP) swabs in vial transport medium  Result Value Ref Range Status   SARS Coronavirus 2 by RT PCR NEGATIVE NEGATIVE Final    Comment: (NOTE) SARS-CoV-2 target nucleic acids are NOT DETECTED.  The SARS-CoV-2 RNA is generally detectable in upper respiratory specimens during the acute phase of infection. The lowest concentration of SARS-CoV-2 viral copies this assay can detect is 138 copies/mL. A negative result does not preclude SARS-Cov-2 infection and should not be used as the sole basis for treatment or other patient management decisions. A negative result may occur with  improper specimen collection/handling, submission of specimen other than nasopharyngeal swab, presence of viral mutation(s) within the  areas targeted by this assay, and inadequate number of viral copies(<138 copies/mL). A negative result must be combined with clinical observations, patient history, and epidemiological information. The expected result is Negative.  Fact Sheet for Patients:  EntrepreneurPulse.com.au  Fact Sheet for Healthcare Providers:  IncredibleEmployment.be  This test is no t yet approved or cleared by the [place] FDA and  has been authorized for detection and/or diagnosis of SARS-CoV-2 by FDA under an Emergency Use Authorization (EUA). This EUA will remain  in effect (meaning this test can be used) for the duration of the COVID-19 declaration under Section 564(b)(1) of the Act, 21 U.S.C.section 360bbb-3(b)(1), unless the authorization is terminated  or revoked sooner.       Influenza A by PCR NEGATIVE NEGATIVE Final   Influenza B by PCR NEGATIVE NEGATIVE Final    Comment: (NOTE) The Xpert Xpress  SARS-CoV-2/FLU/RSV plus assay is intended as an aid in the diagnosis of influenza from Nasopharyngeal swab specimens and should not be used as a sole basis for treatment. Nasal washings and aspirates are unacceptable for Xpert Xpress SARS-CoV-2/FLU/RSV testing.  Fact Sheet for Patients: EntrepreneurPulse.com.au  Fact Sheet for Healthcare Providers: IncredibleEmployment.be  This test is not yet approved or cleared by the [place] FDA and has been authorized for detection and/or diagnosis of SARS-CoV-2 by FDA under an Emergency Use Authorization (EUA). This EUA will remain in effect (meaning this test can be used) for the duration of the COVID-19 declaration under Section 564(b)(1) of the Act, 21 U.S.C. section 360bbb-3(b)(1), unless the authorization is terminated or revoked.  Performed at [facility], [address], [place], [place] [address]   MRSA PCR Screening     Status: Abnormal   Collection Time: 09/11/20  1:23 AM   Specimen: Nasal Mucosa; Nasopharyngeal  Result Value Ref Range Status   MRSA by PCR POSITIVE (A) NEGATIVE Final    Comment:        The GeneXpert MRSA Assay (FDA approved for NASAL specimens only), is one component of a comprehensive MRSA colonization surveillance program. It is not intended to diagnose MRSA infection nor to guide or monitor treatment for MRSA infections. RESULT CALLED TO, READ BACK BY AND VERIFIED WITH: J HEARN,RN@[id number]  09/11/20 [facility] Performed at [facility], [address], [place], [place] [address]      Radiology Studies: DG Chest Portable 1 View  Result Date: 09/10/2020 CLINICAL DATA:  45 year old female with chest pain and cough. EXAM: PORTABLE CHEST 1 VIEW COMPARISON:  Chest radiograph dated 08/30/2020. FINDINGS: There is cardiomegaly with vascular congestion. No focal consolidation, pleural effusion, or pneumothorax. No acute osseous pathology. IMPRESSION: Cardiomegaly with vascular  congestion. No focal consolidation. Electronically Signed   By: Anner Crete M.D.   On: 09/10/2020 17:58   ECHOCARDIOGRAM COMPLETE  Result Date: 09/11/2020    ECHOCARDIOGRAM REPORT   Patient Name:   Chelsey Santiago Date of Exam: 09/11/2020 Medical Rec #:  [id number]       Height:       66.0 in Accession #:    [id number]      Weight:       229.3 lb Date of Birth:  03-Apr-1976       BSA:          2.119 m Patient Age:    45 years        BP:           123/73 mmHg Patient Gender: F  HR:           74 bpm. Exam Location:  [facility] Procedure: 2D Echo Indications:    CHF- Acute Systolic [id number]  History:        Patient has prior history of Echocardiogram examinations, most                 recent 03/28/2020. Risk Factors:Hypertension and Diabetes.  Sonographer:    Mikki Santee RDCS (AE) Referring Phys: [id number] [place]  1. Left ventricular ejection fraction, by estimation, is approximately 35%. The left ventricle has moderately decreased function. The left ventricle demonstrates global hypokinesis. There is moderate asymmetric left ventricular hypertrophy of the posterior segment. Left ventricular diastolic parameters are consistent with Grade II diastolic dysfunction (pseudonormalization).  2. Right ventricular systolic function is normal. The right ventricular size is normal. There is mildly elevated pulmonary artery systolic pressure. The estimated right ventricular systolic pressure is [id number] mmHg.  3. Left atrial size was severely dilated.  4. Right atrial size was moderately dilated.  5. A small pericardial effusion is present. The pericardial effusion is posterior to the left ventricle and lateral to the left ventricle.  6. The mitral valve is grossly normal. Trivial mitral valve regurgitation.  7. The aortic valve is tricuspid. Aortic valve regurgitation is not visualized. Mild to moderate aortic valve sclerosis/calcification is present, without any evidence of aortic  stenosis. Aortic valve mean gradient measures 9.0 mmHg.  8. The inferior vena cava is dilated in size with <50% respiratory variability, suggesting right atrial pressure of 15 mmHg. FINDINGS  Left Ventricle: Left ventricular ejection fraction, by estimation, is 35%. The left ventricle has moderately decreased function. The left ventricle demonstrates global hypokinesis. The left ventricular internal cavity size was normal in size. There is moderate asymmetric left ventricular hypertrophy of the posterior segment. Left ventricular diastolic parameters are consistent with Grade II diastolic dysfunction (pseudonormalization). Right Ventricle: The right ventricular size is normal. No increase in right ventricular wall thickness. Right ventricular systolic function is normal. There is mildly elevated pulmonary artery systolic pressure. The tricuspid regurgitant velocity is 2.67  m/s, and with an assumed right atrial pressure of 15 mmHg, the estimated right ventricular systolic pressure is [id number] mmHg. Left Atrium: Left atrial size was severely dilated. Right Atrium: Right atrial size was moderately dilated. Pericardium: A small pericardial effusion is present. The pericardial effusion is posterior to the left ventricle and lateral to the left ventricle. Mitral Valve: The mitral valve is grossly normal. Mild mitral annular calcification. Trivial mitral valve regurgitation. Tricuspid Valve: The tricuspid valve is grossly normal. Tricuspid valve regurgitation is mild. Aortic Valve: The aortic valve is tricuspid. There is moderate aortic valve annular calcification. Aortic valve regurgitation is not visualized. Mild to moderate aortic valve sclerosis/calcification is present, without any evidence of aortic stenosis. Aortic valve mean gradient measures 9.0 mmHg. Aortic valve peak gradient measures 15.8 mmHg. Aortic valve area, by VTI measures 1.91 cm. Pulmonic Valve: The pulmonic valve was grossly normal. Pulmonic valve  regurgitation is trivial. Aorta: The aortic root is normal in size and structure. Venous: The inferior vena cava is dilated in size with less than 50% respiratory variability, suggesting right atrial pressure of 15 mmHg. IAS/Shunts: No atrial level shunt detected by color flow Doppler.  LEFT VENTRICLE PLAX 2D LVIDd:         5.44 cm      Diastology LVIDs:         4.42 cm  LV e' medial:    3.53 cm/s LV PW:         1.63 cm      LV E/e' medial:  30.0 LV IVS:        1.17 cm      LV e' lateral:   3.60 cm/s LVOT diam:     2.10 cm      LV E/e' lateral: 29.4 LV SV:         66 LV SV Index:   31 LVOT Area:     3.46 cm  LV Volumes (MOD) LV vol d, MOD A4C: 158.0 ml LV vol s, MOD A4C: 87.0 ml LV SV MOD A4C:     158.0 ml RIGHT VENTRICLE RV S prime:     10.90 cm/s TAPSE (M-mode): 1.9 cm LEFT ATRIUM              Index       RIGHT ATRIUM           Index LA diam:        4.80 cm  2.26 cm/m  RA Area:     30.50 cm LA Vol (A2C):   107.0 ml 50.49 ml/m RA Volume:   98.80 ml  46.62 ml/m LA Vol (A4C):   120.0 ml 56.62 ml/m LA Biplane Vol: 113.0 ml 53.32 ml/m  AORTIC VALVE AV Area (Vmax):    1.61 cm AV Area (Vmean):   1.82 cm AV Area (VTI):     1.91 cm AV Vmax:           199.00 cm/s AV Vmean:          142.000 cm/s AV VTI:            0.347 m AV Peak Grad:      15.8 mmHg AV Mean Grad:      9.0 mmHg LVOT Vmax:         92.30 cm/s LVOT Vmean:        74.800 cm/s LVOT VTI:          0.191 m LVOT/AV VTI ratio: 0.55  AORTA Ao Root diam: 2.80 cm MITRAL VALVE                TRICUSPID VALVE MV Area (PHT): 3.48 cm     TR Peak grad:   28.5 mmHg MV Decel Time: 218 msec     TR Vmax:        267.00 cm/s MV E velocity: 106.00 cm/s MV A velocity: 63.80 cm/s   SHUNTS MV E/A ratio:  1.66         Systemic VTI:  0.19 m                             Systemic Diam: 2.10 cm Rozann Lesches MD Electronically signed by Rozann Lesches MD Signature Date/Time: 09/11/2020/11:50:01 AM    Final     Scheduled Meds: . amLODipine  10 mg Oral Daily  . ARIPiprazole   10 mg Oral Daily  . aspirin EC  81 mg Oral Daily  . carvedilol  50 mg Oral BID WC  . Chlorhexidine Gluconate Cloth  6 each Topical Q0600  . clonazePAM  0.5 mg Oral BID  . furosemide  60 mg Intravenous Q12H  . gabapentin  800 mg Oral TID  . heparin  5,000 Units Subcutaneous Q8H  . hydrALAZINE  100 mg Oral TID  . insulin aspart  0-15 Units Subcutaneous TID  WC  . insulin aspart  0-5 Units Subcutaneous QHS  . insulin aspart  4 Units Subcutaneous TID WC  . insulin glargine  22 Units Subcutaneous QHS  . mometasone-formoterol  2 puff Inhalation BID  . mupirocin ointment  1 application Nasal BID  . pantoprazole  40 mg Oral BID  . potassium chloride  40 mEq Oral Daily  . pyridostigmine  60 mg Oral Q8H  . sacubitril-valsartan  1 tablet Oral BID  . spironolactone  12.5 mg Oral Daily  . traZODone  200 mg Oral QHS   Continuous Infusions: . nitroGLYCERIN Stopped (09/11/20 0932)     LOS: 2 days   Time spent: 44 mins   Halie Gass Wynetta Emery, MD How to contact the [facility] Attending or Consulting provider [place] or covering provider during after hours [place], for this patient?  1. Check the care team in [facility] and look for a) attending/consulting [facility] provider listed and b) the [facility] team listed 2. Log into [email or web address] and use [place]'s universal password to access. If you do not have the password, please contact the hospital operator. 3. Locate the [facility] provider you are looking for under [facility] and page to a number that you can be directly reached. 4. If you still have difficulty reaching the provider, please page the [facility] (Director on Call) for the Hospitalists listed on amion for assistance.  09/12/2020, 11:33 AM

## 2020-09-12 NOTE — Progress Notes (Signed)
At this time, patient stated she would let me know later if she decides she is going to leave AMA.

## 2020-09-12 NOTE — Progress Notes (Signed)
Patient called out to nurses station and told the secretary that we were, "out of our rabbit ass minds if she cant have something to drink until in the morning."

## 2020-09-13 DIAGNOSIS — I5021 Acute systolic (congestive) heart failure: Secondary | ICD-10-CM

## 2020-09-13 LAB — BASIC METABOLIC PANEL
Anion gap: 8 (ref 5–15)
BUN: 21 mg/dL — ABNORMAL HIGH (ref 6–20)
CO2: 27 mmol/L (ref 22–32)
Calcium: 8.8 mg/dL — ABNORMAL LOW (ref 8.9–10.3)
Chloride: 102 mmol/L (ref 98–111)
Creatinine, Ser: 1.03 mg/dL — ABNORMAL HIGH (ref 0.44–1.00)
GFR, Estimated: >60 mL/min (ref >60)
Glucose, Bld: 208 mg/dL — ABNORMAL HIGH (ref 70–99)
Potassium: 3.8 mmol/L (ref 3.5–5.1)
Sodium: 137 mmol/L (ref 135–145)

## 2020-09-13 LAB — BRAIN NATRIURETIC PEPTIDE: B Natriuretic Peptide: 261 pg/mL — ABNORMAL HIGH (ref 0.0–100.0)

## 2020-09-13 LAB — MAGNESIUM: Magnesium: 2 mg/dL (ref 1.7–2.4)

## 2020-09-13 LAB — GLUCOSE, CAPILLARY
Glucose-Capillary: 228 mg/dL — ABNORMAL HIGH (ref 70–99)
Glucose-Capillary: 116 mg/dL — ABNORMAL HIGH (ref 70–99)

## 2020-09-13 MED ORDER — POTASSIUM CHLORIDE CRYS ER 20 MEQ PO TBCR: 10.0000 meq | EXTENDED_RELEASE_TABLET | Freq: Every day | ORAL | 3 refills | Status: DC

## 2020-09-13 MED ORDER — SPIRONOLACTONE 25 MG PO TABS: 12.5000 mg | ORAL_TABLET | Freq: Every day | ORAL | 1 refills | Status: DC

## 2020-09-13 NOTE — Discharge Summary (Signed)
Physician Discharge Summary  Chelsey Santiago [id number] DOB: [birth year] DOA: 09/10/2020  PCP: Rosita Fire, MD  Admit date: 09/10/2020 Discharge date: 09/13/2020  Admitted From:  Home  Disposition: Home   Recommendations for Outpatient Follow-up:  1. Follow up with PCP in 1 weeks 2. Follow up with cardiology in 2 weeks 3. Please obtain BMP in 1-2 weeks to follow electrolytes   Discharge Condition: STABLE   CODE STATUS: FULL DIET:  Heart healthy carb modified   Brief Hospitalization Summary: Please see all hospital notes, images, labs for full details of the hospitalization. ADMISSION HPI:  Brief Admission History:  45 y.o.female,with history of obesity, hypertensive heart disease, hypertension, diabetes mellitus type 2, chronic combined systolic and diastolic CHF, anxiety, and more presents the ED with chief complaint of dyspnea. Dyspnea started at 4 AM the same day as presentation. She reports an associated chest pressure in the center of her chest as well. When the pain started she was diaphoretic and could not sleep. Dyspnea progressed throughout the day, was worse with exertion, and improved but was not relieved with rest. She reports a dry cough associated, as well as orthopnea.  She was admitted with acute on chronic systolic heart failure.   Assessment & Plan:   Principal Problem:   Acute CHF ([place]) Active Problems:   Hypokalemia   Hypertensive emergency   Diabetes mellitus with complication   1. Acute heart failure reduced EF - Pt has notorious poor compliance with taking medication appropriately and prior to this hospitalization had run out of meds for over a week. She has diuresed well with IV lasix and cardiology has been following with her. Tthey added spironolactone12.5 mg daily.  She is now transitioned back to home oral torsemide 40 mg BID and will discharge home in stable condition.  Friend brought her [employer] into hospital last night so she likely is not  going to follow a low sodium diet but was counseled by me and nursing staff.    2. Hypokalemia -resolved now.  Check BMP in 1-2 weeks.    3. Hypertensive urgency - due to poor compliance with meds, resumed home meds, BP better controlled, following.   4. Type 2 diabetes mellitus - resume home treatment program.   DVT prophylaxis: SCD/SQ heparin  Code Status: Full  Family Communication: care plan discussed with patient at bedside Disposition: home  Status is: Inpatient  Discharge Diagnoses:  Principal Problem:   Acute CHF [facility]) Active Problems:   Hypokalemia   Hypertensive emergency   Diabetes mellitus with complication [facility])  Discharge Instructions:  Allergies as of 09/13/2020      Reactions   Diclofenac Swelling   AND POSSIBLE SYNCOPE; tolerates ibuprofen per pt   Tramadol Nausea And Vomiting, Nausea Only   Itching (12/21); tolerates ibuprofen per pt   Vicodin [hydrocodone-acetaminophen] Itching, Nausea Only      Medication List    STOP taking these medications   dextromethorphan-guaiFENesin 30-600 MG 12hr tablet Commonly known as: MUCINEX DM   triamcinolone ointment 0.5 % Commonly known as: KENALOG     TAKE these medications   Accu-Chek Guide test strip Generic drug: glucose blood USE AS DIRECTED TO TESTCBLOOD SUGAR TWICE DAILY.   albuterol 108 (90 Base) MCG/ACT inhaler Commonly known as: VENTOLIN HFA Inhale 1 puff into the lungs every 6 (six) hours as needed for wheezing or shortness of breath.   amLODipine 10 MG tablet Commonly known as: NORVASC Take 1 tablet (10 mg total) by mouth daily.   ARIPiprazole  10 MG tablet Commonly known as: ABILIFY Take 1 tablet (10 mg total) by mouth daily.   aspirin EC 81 MG tablet Take 81 mg by mouth daily.   budesonide-formoterol 80-4.5 MCG/ACT inhaler Commonly known as: Symbicort Inhale 2 puffs into the lungs 2 (two) times daily.   carvedilol 25 MG tablet Commonly known as: COREG Take 2 tablets (50 mg total) by  mouth 2 (two) times daily with a meal.   clonazePAM 0.5 MG tablet Commonly known as: KLONOPIN Take 0.5 mg by mouth 2 (two) times daily.   Entresto 97-103 MG Generic drug: sacubitril-valsartan Take 1 tablet by mouth 2 (two) times daily.   gabapentin 800 MG tablet Commonly known as: NEURONTIN Take 800 mg by mouth 3 (three) times daily.   hydrALAZINE 100 MG tablet Commonly known as: APRESOLINE Take 1 tablet (100 mg total) by mouth 3 (three) times daily.   insulin glargine 100 UNIT/ML injection Commonly known as: LANTUS Inject 30 Units at bedtime into the skin.   insulin lispro 100 UNIT/ML injection Commonly known as: HUMALOG Inject 10-16 Units into the skin 3 (three) times daily before meals.   Ipratropium-Albuterol 20-100 MCG/ACT Aers respimat Commonly known as: Combivent Respimat Inhale 1 puff into the lungs every 6 (six) hours as needed for wheezing or shortness of breath.   linaclotide 145 MCG Caps capsule Commonly known as: Linzess Take 1 capsule (145 mcg total) by mouth daily before breakfast.   liraglutide 18 MG/3ML Sopn Commonly known as: VICTOZA Inject 1.2 mg into the skin daily.   nitroGLYCERIN 0.4 MG SL tablet Commonly known as: Nitrostat Place 1 tablet (0.4 mg total) under the tongue every 5 (five) minutes as needed for chest pain.   ondansetron 8 MG disintegrating tablet Commonly known as: Zofran ODT Take 1 tablet (8 mg total) by mouth every 8 (eight) hours as needed for nausea or vomiting.   potassium chloride SA 20 MEQ tablet Commonly known as: Klor-Con M20 Take 0.5 tablets (10 mEq total) by mouth daily. What changed: how much to take   pyridostigmine 60 MG tablet Commonly known as: MESTINON Take 60 mg by mouth every 8 (eight) hours.   spironolactone 25 MG tablet Commonly known as: ALDACTONE Take 0.5 tablets (12.5 mg total) by mouth daily. Start taking on: Sep 14, 2020   torsemide 20 MG tablet Commonly known as: DEMADEX Take 2 tablets (40 mg  total) by mouth 2 (two) times daily.   traZODone 100 MG tablet Commonly known as: DESYREL Take 2 tablets (200 mg total) by mouth at bedtime.       Follow-up Information    Rosita Fire, MD. Schedule an appointment as soon as possible for a visit in 1 week(s).   Specialty: Internal Medicine Contact information: [place] [address] [phone]        Arnoldo Lenis, MD. Schedule an appointment as soon as possible for a visit in 2 week(s).   Specialty: Cardiology Why: Hospital Follow Up  Contact information: [place] [address] [phone]              Allergies  Allergen Reactions  . Diclofenac Swelling    AND POSSIBLE SYNCOPE; tolerates ibuprofen per pt  . Tramadol Nausea And Vomiting and Nausea Only    Itching (12/21); tolerates ibuprofen per pt  . Vicodin [Hydrocodone-Acetaminophen] Itching and Nausea Only   Allergies as of 09/13/2020      Reactions   Diclofenac Swelling   AND POSSIBLE SYNCOPE;  tolerates ibuprofen per pt   Tramadol Nausea And Vomiting, Nausea Only   Itching (12/21); tolerates ibuprofen per pt   Vicodin [hydrocodone-acetaminophen] Itching, Nausea Only      Medication List    STOP taking these medications   dextromethorphan-guaiFENesin 30-600 MG 12hr tablet Commonly known as: MUCINEX DM   triamcinolone ointment 0.5 % Commonly known as: KENALOG     TAKE these medications   Accu-Chek Guide test strip Generic drug: glucose blood USE AS DIRECTED TO TESTCBLOOD SUGAR TWICE DAILY.   albuterol 108 (90 Base) MCG/ACT inhaler Commonly known as: VENTOLIN HFA Inhale 1 puff into the lungs every 6 (six) hours as needed for wheezing or shortness of breath.   amLODipine 10 MG tablet Commonly known as: NORVASC Take 1 tablet (10 mg total) by mouth daily.   ARIPiprazole 10 MG tablet Commonly known as: ABILIFY Take 1 tablet (10 mg total) by mouth daily.   aspirin EC 81 MG tablet Take 81 mg by  mouth daily.   budesonide-formoterol 80-4.5 MCG/ACT inhaler Commonly known as: Symbicort Inhale 2 puffs into the lungs 2 (two) times daily.   carvedilol 25 MG tablet Commonly known as: COREG Take 2 tablets (50 mg total) by mouth 2 (two) times daily with a meal.   clonazePAM 0.5 MG tablet Commonly known as: KLONOPIN Take 0.5 mg by mouth 2 (two) times daily.   Entresto 97-103 MG Generic drug: sacubitril-valsartan Take 1 tablet by mouth 2 (two) times daily.   gabapentin 800 MG tablet Commonly known as: NEURONTIN Take 800 mg by mouth 3 (three) times daily.   hydrALAZINE 100 MG tablet Commonly known as: APRESOLINE Take 1 tablet (100 mg total) by mouth 3 (three) times daily.   insulin glargine 100 UNIT/ML injection Commonly known as: LANTUS Inject 30 Units at bedtime into the skin.   insulin lispro 100 UNIT/ML injection Commonly known as: HUMALOG Inject 10-16 Units into the skin 3 (three) times daily before meals.   Ipratropium-Albuterol 20-100 MCG/ACT Aers respimat Commonly known as: Combivent Respimat Inhale 1 puff into the lungs every 6 (six) hours as needed for wheezing or shortness of breath.   linaclotide 145 MCG Caps capsule Commonly known as: Linzess Take 1 capsule (145 mcg total) by mouth daily before breakfast.   liraglutide 18 MG/3ML Sopn Commonly known as: VICTOZA Inject 1.2 mg into the skin daily.   nitroGLYCERIN 0.4 MG SL tablet Commonly known as: Nitrostat Place 1 tablet (0.4 mg total) under the tongue every 5 (five) minutes as needed for chest pain.   ondansetron 8 MG disintegrating tablet Commonly known as: Zofran ODT Take 1 tablet (8 mg total) by mouth every 8 (eight) hours as needed for nausea or vomiting.   potassium chloride SA 20 MEQ tablet Commonly known as: Klor-Con M20 Take 0.5 tablets (10 mEq total) by mouth daily. What changed: how much to take   pyridostigmine 60 MG tablet Commonly known as: MESTINON Take 60 mg by mouth every 8  (eight) hours.   spironolactone 25 MG tablet Commonly known as: ALDACTONE Take 0.5 tablets (12.5 mg total) by mouth daily. Start taking on: Sep 14, 2020   torsemide 20 MG tablet Commonly known as: DEMADEX Take 2 tablets (40 mg total) by mouth 2 (two) times daily.   traZODone 100 MG tablet Commonly known as: DESYREL Take 2 tablets (200 mg total) by mouth at bedtime.       Procedures/Studies: DG Chest 2 View  Result Date: 08/25/2020 CLINICAL DATA:  Chest pain EXAM: CHEST -  2 VIEW COMPARISON:  06/19/2020 FINDINGS: Cardiomegaly. Both lungs are clear. The visualized skeletal structures are unremarkable. IMPRESSION: Cardiomegaly without acute abnormality of the lungs. Electronically Signed   By: Eddie Candle M.D.   On: 08/25/2020 23:50   DG Chest Portable 1 View  Result Date: 09/10/2020 CLINICAL DATA:  45 year old female with chest pain and cough. EXAM: PORTABLE CHEST 1 VIEW COMPARISON:  Chest radiograph dated 08/30/2020. FINDINGS: There is cardiomegaly with vascular congestion. No focal consolidation, pleural effusion, or pneumothorax. No acute osseous pathology. IMPRESSION: Cardiomegaly with vascular congestion. No focal consolidation. Electronically Signed   By: Anner Crete M.D.   On: 09/10/2020 17:58   DG Chest Portable 1 View  Result Date: 08/30/2020 CLINICAL DATA:  Shortness of breath EXAM: PORTABLE CHEST 1 VIEW COMPARISON:  08/25/2020 FINDINGS: Cardiomegaly with vascular congestion. No confluent opacities, overt edema or effusions. No acute bony abnormality. IMPRESSION: Cardiomegaly, vascular congestion. Electronically Signed   By: Rolm Baptise M.D.   On: 08/30/2020 23:08   ECHOCARDIOGRAM COMPLETE  Result Date: 09/11/2020    ECHOCARDIOGRAM REPORT   Patient Name:   LYZBETH GENRICH Date of Exam: 09/11/2020 Medical Rec #:  [id number]       Height:       66.0 in Accession #:    [id number]      Weight:       229.3 lb Date of Birth:  1976-01-19       BSA:          2.119 m Patient Age:     23 years        BP:           123/73 mmHg Patient Gender: F               HR:           74 bpm. Exam Location:  [facility] Procedure: 2D Echo Indications:    CHF- Acute Systolic H41.93  History:        Patient has prior history of Echocardiogram examinations, most                 recent 03/28/2020. Risk Factors:Hypertension and Diabetes.  Sonographer:    Mikki Santee RDCS (AE) Referring Phys: [id number] [place]  1. Left ventricular ejection fraction, by estimation, is approximately 35%. The left ventricle has moderately decreased function. The left ventricle demonstrates global hypokinesis. There is moderate asymmetric left ventricular hypertrophy of the posterior segment. Left ventricular diastolic parameters are consistent with Grade II diastolic dysfunction (pseudonormalization).  2. Right ventricular systolic function is normal. The right ventricular size is normal. There is mildly elevated pulmonary artery systolic pressure. The estimated right ventricular systolic pressure is 73.5 mmHg.  3. Left atrial size was severely dilated.  4. Right atrial size was moderately dilated.  5. A small pericardial effusion is present. The pericardial effusion is posterior to the left ventricle and lateral to the left ventricle.  6. The mitral valve is grossly normal. Trivial mitral valve regurgitation.  7. The aortic valve is tricuspid. Aortic valve regurgitation is not visualized. Mild to moderate aortic valve sclerosis/calcification is present, without any evidence of aortic stenosis. Aortic valve mean gradient measures 9.0 mmHg.  8. The inferior vena cava is dilated in size with <50% respiratory variability, suggesting right atrial pressure of 15 mmHg. FINDINGS  Left Ventricle: Left ventricular ejection fraction, by estimation, is 35%. The left ventricle has moderately decreased function. The left ventricle demonstrates global hypokinesis. The left ventricular internal  cavity size was normal in  size. There is moderate asymmetric left ventricular hypertrophy of the posterior segment. Left ventricular diastolic parameters are consistent with Grade II diastolic dysfunction (pseudonormalization). Right Ventricle: The right ventricular size is normal. No increase in right ventricular wall thickness. Right ventricular systolic function is normal. There is mildly elevated pulmonary artery systolic pressure. The tricuspid regurgitant velocity is 2.67  m/s, and with an assumed right atrial pressure of 15 mmHg, the estimated right ventricular systolic pressure is [id number]. Left Atrium: Left atrial size was severely dilated. Right Atrium: Right atrial size was moderately dilated. Pericardium: A small pericardial effusion is present. The pericardial effusion is posterior to the left ventricle and lateral to the left ventricle. Mitral Valve: The mitral valve is grossly normal. Mild mitral annular calcification. Trivial mitral valve regurgitation. Tricuspid Valve: The tricuspid valve is grossly normal. Tricuspid valve regurgitation is mild. Aortic Valve: The aortic valve is tricuspid. There is moderate aortic valve annular calcification. Aortic valve regurgitation is not visualized. Mild to moderate aortic valve sclerosis/calcification is present, without any evidence of aortic stenosis. Aortic valve mean gradient measures 9.0 mmHg. Aortic valve peak gradient measures 15.8 mmHg. Aortic valve area, by VTI measures 1.91 cm. Pulmonic Valve: The pulmonic valve was grossly normal. Pulmonic valve regurgitation is trivial. Aorta: The aortic root is normal in size and structure. Venous: The inferior vena cava is dilated in size with less than 50% respiratory variability, suggesting right atrial pressure of 15 mmHg. IAS/Shunts: No atrial level shunt detected by color flow Doppler.  LEFT VENTRICLE PLAX 2D LVIDd:         5.44 cm      Diastology LVIDs:         4.42 cm      LV e' medial:    3.53 cm/s LV PW:         1.63 cm      LV  E/e' medial:  30.0 LV IVS:        1.17 cm      LV e' lateral:   3.60 cm/s LVOT diam:     2.10 cm      LV E/e' lateral: 29.4 LV SV:         66 LV SV Index:   31 LVOT Area:     3.46 cm  LV Volumes (MOD) LV vol d, MOD A4C: 158.0 ml LV vol s, MOD A4C: 87.0 ml LV SV MOD A4C:     158.0 ml RIGHT VENTRICLE RV S prime:     10.90 cm/s TAPSE (M-mode): 1.9 cm LEFT ATRIUM              Index       RIGHT ATRIUM           Index LA diam:        4.80 cm  2.26 cm/m  RA Area:     30.50 cm LA Vol (A2C):   107.0 ml 50.49 ml/m RA Volume:   98.80 ml  46.62 ml/m LA Vol (A4C):   120.0 ml 56.62 ml/m LA Biplane Vol: 113.0 ml 53.32 ml/m  AORTIC VALVE AV Area (Vmax):    1.61 cm AV Area (Vmean):   1.82 cm AV Area (VTI):     1.91 cm AV Vmax:           199.00 cm/s AV Vmean:          142.000 cm/s AV VTI:  0.347 m AV Peak Grad:      15.8 mmHg AV Mean Grad:      9.0 mmHg LVOT Vmax:         92.30 cm/s LVOT Vmean:        74.800 cm/s LVOT VTI:          0.191 m LVOT/AV VTI ratio: 0.55  AORTA Ao Root diam: 2.80 cm MITRAL VALVE                TRICUSPID VALVE MV Area (PHT): 3.48 cm     TR Peak grad:   28.5 mmHg MV Decel Time: 218 msec     TR Vmax:        267.00 cm/s MV E velocity: 106.00 cm/s MV A velocity: 63.80 cm/s   SHUNTS MV E/A ratio:  1.66         Systemic VTI:  0.19 m                             Systemic Diam: 2.10 cm Rozann Lesches MD Electronically signed by Rozann Lesches MD Signature Date/Time: 09/11/2020/11:50:01 AM    Final       Subjective: Pt reports she is breathing much better. Friend had brought her [employer] chicken overnight and she had threatened to leave AMA when nursing staff told her she is not supposed to be eating outside food.   Discharge Exam: Vitals:   09/13/20 0732 09/13/20 0839  BP:  (!) 142/81  Pulse:    Resp:    Temp:    SpO2: 96%    Vitals:   09/13/20 0437 09/13/20 0500 09/13/20 0732 09/13/20 0839  BP: 133/77   (!) 142/81  Pulse: 76     Resp: 17     Temp: 98.5 F (36.9 C)      TempSrc:      SpO2: 97%  96%   Weight:  101.9 kg    Height:       General: Pt is alert, awake, not in acute distress Cardiovascular: normal S1/S2 +, no rubs, no gallops Respiratory: CTA bilaterally, no wheezing, no rhonchi Abdominal: Soft, NT, ND, bowel sounds + Extremities: trace edema, no cyanosis   The results of significant diagnostics from this hospitalization (including imaging, microbiology, ancillary and laboratory) are listed below for reference.     Microbiology: Recent Results (from the past 240 hour(s))  SARS CORONAVIRUS 2 (TAT 6-24 HRS) Nasopharyngeal Nasopharyngeal Swab     Status: None   Collection Time: 09/10/20  4:58 PM   Specimen: Nasopharyngeal Swab  Result Value Ref Range Status   SARS Coronavirus 2 NEGATIVE NEGATIVE Final    Comment: (NOTE) SARS-CoV-2 target nucleic acids are NOT DETECTED.  The SARS-CoV-2 RNA is generally detectable in upper and lower respiratory specimens during the acute phase of infection. Negative results do not preclude SARS-CoV-2 infection, do not rule out co-infections with other pathogens, and should not be used as the sole basis for treatment or other patient management decisions. Negative results must be combined with clinical observations, patient history, and epidemiological information. The expected result is Negative.  Fact Sheet for Patients: SugarRoll.be  Fact Sheet for Healthcare Providers: https://www.woods-mathews.com/  This test is not yet approved or cleared by the [place] FDA and  has been authorized for detection and/or diagnosis of SARS-CoV-2 by FDA under an Emergency Use Authorization (EUA). This EUA will remain  in effect (meaning this test can be used) for the  duration of the COVID-19 declaration under Se ction 564(b)(1) of the Act, 21 U.S.C. section 360bbb-3(b)(1), unless the authorization is terminated or revoked sooner.  Performed at [facility], [facility] [facility] [address], [place], [place] [address]   Resp Panel by RT-PCR (Flu A&B, Covid) Nasopharyngeal Swab     Status: None   Collection Time: 09/10/20 10:33 PM   Specimen: Nasopharyngeal Swab; Nasopharyngeal(NP) swabs in vial transport medium  Result Value Ref Range Status   SARS Coronavirus 2 by RT PCR NEGATIVE NEGATIVE Final    Comment: (NOTE) SARS-CoV-2 target nucleic acids are NOT DETECTED.  The SARS-CoV-2 RNA is generally detectable in upper respiratory specimens during the acute phase of infection. The lowest concentration of SARS-CoV-2 viral copies this assay can detect is 138 copies/mL. A negative result does not preclude SARS-Cov-2 infection and should not be used as the sole basis for treatment or other patient management decisions. A negative result may occur with  improper specimen collection/handling, submission of specimen other than nasopharyngeal swab, presence of viral mutation(s) within the areas targeted by this assay, and inadequate number of viral copies(<138 copies/mL). A negative result must be combined with clinical observations, patient history, and epidemiological information. The expected result is Negative.  Fact Sheet for Patients:  EntrepreneurPulse.com.au  Fact Sheet for Healthcare Providers:  IncredibleEmployment.be  This test is no t yet approved or cleared by the [place] FDA and  has been authorized for detection and/or diagnosis of SARS-CoV-2 by FDA under an Emergency Use Authorization (EUA). This EUA will remain  in effect (meaning this test can be used) for the duration of the COVID-19 declaration under Section 564(b)(1) of the Act, 21 U.S.C.section 360bbb-3(b)(1), unless the authorization is terminated  or revoked sooner.       Influenza A by PCR NEGATIVE NEGATIVE Final   Influenza B by PCR NEGATIVE NEGATIVE Final    Comment: (NOTE) The Xpert Xpress SARS-CoV-2/FLU/RSV plus assay is intended as an  aid in the diagnosis of influenza from Nasopharyngeal swab specimens and should not be used as a sole basis for treatment. Nasal washings and aspirates are unacceptable for Xpert Xpress SARS-CoV-2/FLU/RSV testing.  Fact Sheet for Patients: EntrepreneurPulse.com.au  Fact Sheet for Healthcare Providers: IncredibleEmployment.be  This test is not yet approved or cleared by the [place] FDA and has been authorized for detection and/or diagnosis of SARS-CoV-2 by FDA under an Emergency Use Authorization (EUA). This EUA will remain in effect (meaning this test can be used) for the duration of the COVID-19 declaration under Section 564(b)(1) of the Act, 21 U.S.C. section 360bbb-3(b)(1), unless the authorization is terminated or revoked.  Performed at [facility], [address], [place], [place] [address]   MRSA PCR Screening     Status: Abnormal   Collection Time: 09/11/20  1:23 AM   Specimen: Nasal Mucosa; Nasopharyngeal  Result Value Ref Range Status   MRSA by PCR POSITIVE (A) NEGATIVE Final    Comment:        The GeneXpert MRSA Assay (FDA approved for NASAL specimens only), is one component of a comprehensive MRSA colonization surveillance program. It is not intended to diagnose MRSA infection nor to guide or monitor treatment for MRSA infections. RESULT CALLED TO, READ BACK BY AND VERIFIED WITH: J HEARN,[email or web address]@[id number]  09/11/20 [facility] Performed at [facility], [address], [place], [place] [address]      Labs: BNP (last 3 results) Recent Labs    09/10/20 1805 09/12/20 0434 09/13/20 0454  BNP 2,283.0* 388.0* 261.0*  Basic Metabolic Panel: Recent Labs  Lab 09/10/20 1803 09/11/20 0544 09/12/20 0434 09/13/20 0454  NA 137 139 138 137  K 3.3* 3.0* 3.4* 3.8  CL 100 98 101 102  CO2 28 31 29 27   GLUCOSE 91 95 108* 208*  BUN 17 17 15  21*  CREATININE 0.96 0.96 0.89 1.03*  CALCIUM 8.7* 8.7* 8.6* 8.8*  MG  --  1.6* 1.9 2.0    Liver Function Tests: Recent Labs  Lab 09/11/20 0544  AST 18  ALT 14  ALKPHOS 62  BILITOT 0.9  PROT 7.2  ALBUMIN 3.3*   No results for input(s): LIPASE, AMYLASE in the last 168 hours. No results for input(s): AMMONIA in the last 168 hours. CBC: Recent Labs  Lab 09/10/20 1803 09/11/20 0544 09/12/20 0434  WBC 9.9 9.6 9.2  NEUTROABS  --  6.3 6.3  HGB 9.9* 10.2* 10.7*  HCT 32.9* 33.6* 35.1*  MCV 75.6* 74.2* 75.5*  PLT 250 265 254   Cardiac Enzymes: No results for input(s): CKTOTAL, CKMB, CKMBINDEX, TROPONINI in the last 168 hours. BNP: Invalid input(s): POCBNP CBG: Recent Labs  Lab 09/12/20 0729 09/12/20 1208 09/12/20 1610 09/12/20 2127 09/13/20 0729  GLUCAP 166* 220* 142* 181* 228*   D-Dimer No results for input(s): DDIMER in the last 72 hours. Hgb A1c No results for input(s): HGBA1C in the last 72 hours. Lipid Profile No results for input(s): CHOL, HDL, LDLCALC, TRIG, CHOLHDL, LDLDIRECT in the last 72 hours. Thyroid function studies No results for input(s): TSH, T4TOTAL, T3FREE, THYROIDAB in the last 72 hours.  Invalid input(s): FREET3 Anemia work up No results for input(s): VITAMINB12, FOLATE, FERRITIN, TIBC, IRON, RETICCTPCT in the last 72 hours. Urinalysis    Component Value Date/Time   COLORURINE YELLOW 08/28/2019 1226   APPEARANCEUR CLEAR 08/28/2019 1226   LABSPEC 1.028 08/28/2019 1226   PHURINE 6.0 08/28/2019 1226   GLUCOSEU >=500 (A) 08/28/2019 1226   HGBUR NEGATIVE 08/28/2019 1226   BILIRUBINUR NEGATIVE 08/28/2019 1226   KETONESUR NEGATIVE 08/28/2019 1226   PROTEINUR Negative 12/19/2019 1010   PROTEINUR NEGATIVE 08/28/2019 1226   UROBILINOGEN 0.2 04/29/2014 1115   NITRITE n 12/19/2019 1010   NITRITE NEGATIVE 08/28/2019 1226   LEUKOCYTESUR Negative 12/19/2019 1010   LEUKOCYTESUR TRACE (A) 08/28/2019 1226   Sepsis Labs Invalid input(s): PROCALCITONIN,  WBC,  LACTICIDVEN Microbiology Recent Results (from the past 240 hour(s))  SARS  CORONAVIRUS 2 (TAT 6-24 HRS) Nasopharyngeal Nasopharyngeal Swab     Status: None   Collection Time: 09/10/20  4:58 PM   Specimen: Nasopharyngeal Swab  Result Value Ref Range Status   SARS Coronavirus 2 NEGATIVE NEGATIVE Final    Comment: (NOTE) SARS-CoV-2 target nucleic acids are NOT DETECTED.  The SARS-CoV-2 RNA is generally detectable in upper and lower respiratory specimens during the acute phase of infection. Negative results do not preclude SARS-CoV-2 infection, do not rule out co-infections with other pathogens, and should not be used as the sole basis for treatment or other patient management decisions. Negative results must be combined with clinical observations, patient history, and epidemiological information. The expected result is Negative.  Fact Sheet for Patients: SugarRoll.be  Fact Sheet for Healthcare Providers: https://www.woods-mathews.com/  This test is not yet approved or cleared by the [place] FDA and  has been authorized for detection and/or diagnosis of SARS-CoV-2 by FDA under an Emergency Use Authorization (EUA). This EUA will remain  in effect (meaning this test can be used) for the duration of the COVID-19 declaration under Se  ction 564(b)(1) of the Act, 21 U.S.C. section 360bbb-3(b)(1), unless the authorization is terminated or revoked sooner.  Performed at [facility], [place] [address] [place], [place] [address]   Resp Panel by RT-PCR (Flu A&B, Covid) Nasopharyngeal Swab     Status: None   Collection Time: 09/10/20 10:33 PM   Specimen: Nasopharyngeal Swab; Nasopharyngeal(NP) swabs in vial transport medium  Result Value Ref Range Status   SARS Coronavirus 2 by RT PCR NEGATIVE NEGATIVE Final    Comment: (NOTE) SARS-CoV-2 target nucleic acids are NOT DETECTED.  The SARS-CoV-2 RNA is generally detectable in upper respiratory specimens during the acute phase of infection. The lowest concentration of  SARS-CoV-2 viral copies this assay can detect is 138 copies/mL. A negative result does not preclude SARS-Cov-2 infection and should not be used as the sole basis for treatment or other patient management decisions. A negative result may occur with  improper specimen collection/handling, submission of specimen other than nasopharyngeal swab, presence of viral mutation(s) within the areas targeted by this assay, and inadequate number of viral copies(<138 copies/mL). A negative result must be combined with clinical observations, patient history, and epidemiological information. The expected result is Negative.  Fact Sheet for Patients:  EntrepreneurPulse.com.au  Fact Sheet for Healthcare Providers:  IncredibleEmployment.be  This test is no t yet approved or cleared by the [place] FDA and  has been authorized for detection and/or diagnosis of SARS-CoV-2 by FDA under an Emergency Use Authorization (EUA). This EUA will remain  in effect (meaning this test can be used) for the duration of the COVID-19 declaration under Section 564(b)(1) of the Act, 21 U.S.C.section 360bbb-3(b)(1), unless the authorization is terminated  or revoked sooner.       Influenza A by PCR NEGATIVE NEGATIVE Final   Influenza B by PCR NEGATIVE NEGATIVE Final    Comment: (NOTE) The Xpert Xpress SARS-CoV-2/FLU/RSV plus assay is intended as an aid in the diagnosis of influenza from Nasopharyngeal swab specimens and should not be used as a sole basis for treatment. Nasal washings and aspirates are unacceptable for Xpert Xpress SARS-CoV-2/FLU/RSV testing.  Fact Sheet for Patients: EntrepreneurPulse.com.au  Fact Sheet for Healthcare Providers: IncredibleEmployment.be  This test is not yet approved or cleared by the [place] FDA and has been authorized for detection and/or diagnosis of SARS-CoV-2 by FDA under an Emergency Use  Authorization (EUA). This EUA will remain in effect (meaning this test can be used) for the duration of the COVID-19 declaration under Section 564(b)(1) of the Act, 21 U.S.C. section 360bbb-3(b)(1), unless the authorization is terminated or revoked.  Performed at [facility], [address], [place], [place] [address]   MRSA PCR Screening     Status: Abnormal   Collection Time: 09/11/20  1:23 AM   Specimen: Nasal Mucosa; Nasopharyngeal  Result Value Ref Range Status   MRSA by PCR POSITIVE (A) NEGATIVE Final    Comment:        The GeneXpert MRSA Assay (FDA approved for NASAL specimens only), is one component of a comprehensive MRSA colonization surveillance program. It is not intended to diagnose MRSA infection nor to guide or monitor treatment for MRSA infections. RESULT CALLED TO, READ BACK BY AND VERIFIED WITH: J [id number]  09/11/20 [facility] Performed at [facility], [address] [place], [place] [address]    Time coordinating discharge: 40 mins   SIGNED:  Irwin Brakeman, MD  [facility] 09/13/2020, 11:02 AM How to contact the [facility] Attending or Consulting provider [place]  or covering provider during after hours [employer], for this patient?  1. Check the care team in [facility] and look for a) attending/consulting [facility] provider listed and b) the [facility] team listed 2. Log into [email or web address] and use [place]'s universal password to access. If you do not have the password, please contact the hospital operator. 3. Locate the [facility] provider you are looking for under [facility] and page to a number that you can be directly reached. 4. If you still have difficulty reaching the provider, please page the [facility] (Director on Call) for the Hospitalists listed on amion for assistance.

## 2020-09-13 NOTE — Discharge Instructions (Signed)
Heart Failure Action Plan A heart failure action plan helps you understand what to do when you have symptoms of heart failure. Your action plan is a color-coded plan that lists the symptoms to watch for and indicates what actions to take.  If you have symptoms in the red zone, you need medical care right away.  If you have symptoms in the yellow zone, you are having problems.  If you have symptoms in the green zone, you are doing well. Follow the plan that was created by you and your health care provider. Review your plan each time you visit your health care provider. Red zone These signs and symptoms mean you should get medical help right away:  You have trouble breathing when resting.  You have a dry cough that is getting worse.  You have swelling or pain in your legs or abdomen that is getting worse.  You suddenly gain more than 2-3 lb (0.9-1.4 kg) in 24 hours, or more than 5 lb (2.3 kg) in a week. This amount may be more or less depending on your condition.  You have trouble staying awake or you feel confused.  You have chest pain.  You do not have an appetite.  You pass out.  You have worsening sadness or depression. If you have any of these symptoms, call your local emergency services (911 in the [place] right away. Do not drive yourself to the hospital.   Yellow zone These signs and symptoms mean your condition may be getting worse and you should make some changes:  You have trouble breathing when you are active, or you need to sleep with your head raised on extra pillows to help you breathe.  You have swelling in your legs or abdomen.  You gain 2-3 lb (0.9-1.4 kg) in 24 hours, or 5 lb (2.3 kg) in a week. This amount may be more or less depending on your condition.  You get tired easily.  You have trouble sleeping.  You have a dry cough. If you have any of these symptoms:  Contact your health care provider within the next day.  Your health care provider may adjust  your medicines.   Green zone These signs mean you are doing well and can continue what you are doing:  You do not have shortness of breath.  You have very little swelling or no new swelling.  Your weight is stable (no gain or loss).  You have a normal activity level.  You do not have chest pain or any other new symptoms.   Follow these instructions at home:  Take over-the-counter and prescription medicines only as told by your health care provider.  Weigh yourself daily. Your target weight is __________ lb (__________ kg). ? Call your health care provider if you gain more than __________ lb (__________ kg) in 24 hours, or more than __________ lb (__________ kg) in a week. ? Health care provider name: _____________________________________________________ ? Health care provider phone number: _____________________________________________________  Eat a heart-healthy diet. Work with a diet and nutrition specialist (dietitian) to create an eating plan that is best for you.  Keep all follow-up visits. This is important. Where to find more information  American Heart Association: www.heart.org Summary  A heart failure action plan helps you understand what to do when you have symptoms of heart failure.  Follow the action plan that was created by you and your health care provider.  Get help right away if you have any symptoms in the red zone.  This information is not intended to replace advice given to you by your health care provider. Make sure you discuss any questions you have with your health care provider. Document Revised: 12/17/2019 Document Reviewed: 12/17/2019 Elsevier Patient Education  2021 [place].   Heart Failure, Self-Care Heart failure is a serious condition. The following information explains things you need to do to take care of yourself at home. To help you stay as healthy as possible, you may be asked to change your diet, take certain medicines, and make other  changes in your life. Your doctor may also give you more specific instructions. If you have problems or questions, call your doctor. What are the risks? Having heart failure makes it more likely for you to have some problems. These problems can get worse if you do not take good care of yourself. Problems may include:  Damage to the kidneys, liver, or lungs.  Malnutrition.  Abnormal heart rhythms.  Blood clotting problems that could cause a stroke. Supplies needed:  Scale for weighing yourself.  Blood pressure monitor.  Notebook.  Medicines. How to care for yourself when you have heart failure Medicines Take over-the-counter and prescription medicines only as told by your doctor. Take your medicines every day.  Do not stop taking your medicine unless your doctor tells you to do so.  Do not skip any medicines.  Get your prescriptions refilled before you run out of medicine. This is important.  Talk with your doctor if you cannot afford your medicines. Eating and drinking  Eat heart-healthy foods. Talk with a diet specialist (dietitian) to create an eating plan.  Limit salt (sodium) if told by your doctor. Ask your diet specialist to tell you which seasonings are healthy for your heart.  Cook in healthy ways instead of frying. Healthy ways of cooking include roasting, grilling, broiling, baking, poaching, steaming, and stir-frying.  Choose foods that: ? Have no trans fat. ? Are low in saturated fat and cholesterol.  Choose healthy foods, such as: ? Fresh or frozen fruits and vegetables. ? Fish. ? Low-fat (lean) meats. ? Legumes, such as beans, peas, and lentils. ? Fat-free or low-fat dairy products. ? Whole-grain foods. ? High-fiber foods.  Limit how much fluid you drink, if told by your doctor.   Alcohol use  Do not drink alcohol if: ? Your doctor tells you not to drink. ? Your heart was damaged by alcohol, or you have very bad heart failure. ? You are  pregnant, may be pregnant, or are planning to become pregnant.  If you drink alcohol: ? Limit how much you have to:  0-1 drink a day for women.  0-2 drinks a day for men. ? Know how much alcohol is in your drink. In the [place] one drink equals one 12 oz bottle of beer (355 mL), one 5 oz glass of wine (148 mL), or one 1 oz glass of hard liquor (44 mL). Lifestyle  Do not smoke or use any products that contain nicotine or tobacco. If you need help quitting, ask your doctor. ? Do not use nicotine gum or patches before talking to your doctor.  Do not use illegal drugs.  Lose weight if told by your doctor.  Do physical activity if told by your doctor. Talk to your doctor before you begin an exercise if: ? You are an older adult. ? You have very bad heart failure.  Learn to manage stress. If you need help, ask your doctor.  Get physical rehab (rehabilitation)  to help you stay independent and to help with your quality of life.  Participate in a cardiac rehab program. This program helps you improve your health through exercise, education, and counseling.  Plan time to rest when you get tired.   Check weight and blood pressure  Weigh yourself every day. This will help you to know if fluid is building up in your body. ? Weigh yourself every morning after you pee (urinate) and before you eat breakfast. ? Wear the same amount of clothing each time. ? Write down your daily weight. Give your record to your doctor.  Check and write down your blood pressure as told by your doctor.  Check your pulse as told by your doctor.   Dealing with very hot and very cold weather  If it is very hot: ? Avoid activities that take a lot of energy. ? Use air conditioning or fans, or find a cooler place. ? Avoid caffeine and alcohol. ? Wear clothing that is loose-fitting, lightweight, and light-colored.  If it is very cold: ? Avoid activities that take a lot of energy. ? Layer your clothes. ? Wear  mittens or gloves, a hat, and a face covering when you go outside. ? Avoid alcohol. Follow these instructions at home:  Stay up to date with shots (vaccines). Get pneumococcal and flu (influenza) shots.  Keep all follow-up visits. Contact a doctor if:  You gain 2-3 lb (1-1.4 kg) in 24 hours or 5 lb (2.3 kg) in a week.  You have increasing shortness of breath.  You cannot do your normal activities.  You get tired easily.  You cough a lot.  You do not feel like eating or feel like you may vomit (nauseous).  You have swelling in your hands, feet, ankles, or belly (abdomen).  You cannot sleep well because it is hard to breathe.  You feel like your heart is beating fast (palpitations).  You get dizzy when you stand up.  You feel depressed or sad. Get help right away if:  You have trouble breathing.  You or someone else notices a change in your behavior, such as having trouble staying awake.  You have chest pain or discomfort.  You pass out (faint). These symptoms may be an emergency. Get help right away. Call your local emergency services (911 in the [place]  Do not wait to see if the symptoms will go away.  Do not drive yourself to the hospital. Summary  Heart failure is a serious condition. To care for yourself, you may have to change your diet, take medicines, and make other lifestyle changes.  Take your medicines every day. Do not stop taking them unless your doctor tells you to do so.  Limit salt and eat heart-healthy foods.  Ask your doctor if you can drink alcohol. You may have to stop alcohol use if you have very bad heart failure.  Contact your doctor if you gain weight quickly or feel that your heart is beating too fast. Get help right away if you pass out or have chest pain or trouble breathing. This information is not intended to replace advice given to you by your health care provider. Make sure you discuss any questions you have with your health care  provider. Document Revised: 11/24/2019 Document Reviewed: 11/24/2019 Elsevier Patient Education  [place].   Heart Failure Eating Plan Heart failure, also called congestive heart failure, occurs when your heart does not pump blood well enough to meet your body's needs  for oxygen-rich blood. Heart failure is a long-term (chronic) condition. Living with heart failure can be challenging. Following your health care provider's instructions about a healthy lifestyle and working with a dietitian to choose the right foods may help to improve your symptoms. An eating plan for someone with heart failure will include changes that limit the intake of salt (sodium) and unhealthy fat. What are tips for following this plan? Reading food labels  Check food labels for the amount of sodium per serving. Choose foods that have less than 140 mg (milligrams) of sodium in each serving.  Check food labels for the number of calories per serving. This is important if you need to limit your daily calorie intake to lose weight.  Check food labels for the serving size. If you eat more than one serving, you will be eating more sodium and calories than what is listed on the label.  Look for foods that are labeled as "sodium-free," "very low sodium," or "low sodium." ? Foods labeled as "reduced sodium" or "lightly salted" may still have more sodium than what is recommended for you. Cooking  Avoid adding salt when cooking. Ask your health care provider or dietitian before using salt substitutes.  Season food with salt-free seasonings, spices, or herbs. Check the label of seasoning mixes to make sure they do not contain salt.  Cook with heart-healthy oils, such as olive, canola, soybean, or sunflower oil.  Do not fry foods. Cook foods using low-fat methods, such as baking, boiling, grilling, and broiling.  Limit unhealthy fats when cooking by: ? Removing the skin from poultry, such as chicken. ? Removing all  visible fats from meats. ? Skimming the fat off from stews, soups, and gravies before serving them. Meal planning  Limit your intake of: ? Processed, canned, or prepackaged foods. ? Foods that are high in trans fat, such as fried foods. ? Sweets, desserts, sugary drinks, and other foods with added sugar. ? Full-fat dairy products, such as whole milk.  Eat a balanced diet. This may include: ? 4-5 servings of fruit each day and 4-5 servings of vegetables each day. At each meal, try to fill one-half of your plate with fruits and vegetables. ? Up to 6-8 servings of whole grains each day. ? Up to 2 servings of lean meat, poultry, or fish each day. One serving of meat is equal to 3 oz (85 g). This is about the same size as a deck of cards. ? 2 servings of low-fat dairy each day. ? Heart-healthy fats. Healthy fats called omega-3 fatty acids are found in foods such as flaxseed and cold-water fish like sardines, salmon, and mackerel.  Aim to eat 25-35 g (grams) of fiber a day. Foods that are high in fiber include apples, broccoli, carrots, beans, peas, and whole grains.  Do not add salt or condiments that contain salt (such as soy sauce) to foods before eating.  When eating at a restaurant, ask that your food be prepared with less salt or no salt, if possible.  Try to eat 2 or more vegetarian meals each week.  Eat more home-cooked food and eat less restaurant, buffet, and fast food.   General information  Do not eat more than 2,300 mg of sodium a day. The amount of sodium that is recommended for you may be lower, depending on your condition.  Maintain a healthy body weight as directed. Ask your health care provider what a healthy weight is for you. ? Check your weight every  day. ? Work with your health care provider and dietitian to make a plan that is right for you to lose weight or maintain your current weight.  Limit how much fluid you drink. Ask your health care provider or dietitian how  much fluid you can have each day.  Limit or avoid alcohol as told by your health care provider or dietitian. Recommended foods Fruits All fresh, frozen, and canned fruits. Dried fruits, such as raisins, prunes, and cranberries. Vegetables All fresh vegetables. Vegetables that are frozen without sauce or added salt. Low-sodium or sodium-free canned vegetables. Grains Bread with less than 80 mg of sodium per slice. Whole-wheat pasta, quinoa, and brown rice. Oats and oatmeal. Barley. [place]. Grits and cream of wheat. Whole-grain and whole-wheat cold cereal. Meats and other protein foods Lean cuts of meat. Skinless chicken and [place]. Fish with high omega-3 fatty acids, such as salmon, sardines, and other cold-water fishes. Eggs. Dried beans, peas, and edamame. Unsalted nuts and nut butters. Dairy Low-fat or nonfat (skim) milk and dried milk. Rice milk, soy milk, and almond milk. Low-fat or nonfat yogurt. Small amounts of reduced-sodium block cheese. Low-sodium cottage cheese. Fats and oils Olive, canola, soybean, flaxseed, avocado, or sunflower oil. Sweets and desserts Applesauce. Granola bars. Sugar-free pudding and gelatin. Frozen fruit bars. Seasoning and other foods Fresh and dried herbs. Lemon or lime juice. Vinegar. Low-sodium ketchup. Salt-free marinades, salad dressings, sauces, and seasonings. The items listed above may not be a complete list of foods and beverages you can eat. Contact a dietitian for more information. Foods to avoid Fruits Fruits that are dried with sodium-containing preservatives. Vegetables Canned vegetables. Frozen vegetables with sauce or seasonings. Creamed vegetables. [place] fries. Onion rings. Pickled vegetables and sauerkraut. Grains Bread with more than 80 mg of sodium per slice. Hot or cold cereal with more than 140 mg sodium per serving. Salted pretzels and crackers. Prepackaged breadcrumbs. Bagels, croissants, and biscuits. Meats and other protein  foods Ribs and chicken wings. Bacon, ham, pepperoni, bologna, salami, and packaged luncheon meats. Hot dogs, bratwurst, and sausage. Canned meat. Smoked meat and fish. Salted nuts and seeds. Dairy Whole milk, half-and-half, and cream. Buttermilk. Processed cheese, cheese spreads, and cheese curds. Regular cottage cheese. Feta cheese. Shredded cheese. String cheese. Fats and oils Butter, lard, shortening, ghee, and bacon fat. Canned and packaged gravies. Seasoning and other foods Onion salt, garlic salt, table salt, and sea salt. Marinades. Regular salad dressings. Relishes, pickles, and olives. Meat flavorings and tenderizers, and bouillon cubes. Horseradish, ketchup, and mustard. Worcestershire sauce. Teriyaki sauce, soy sauce (including reduced sodium). Hot sauce and Tabasco sauce. Steak sauce, fish sauce, oyster sauce, and cocktail sauce. Taco seasonings. Barbecue sauce. Tartar sauce. The items listed above may not be a complete list of foods and beverages you should avoid. Contact a dietitian for more information. Summary  A heart failure eating plan includes changes that limit your intake of sodium and unhealthy fat, and it may help you lose weight or maintain a healthy weight. Your health care provider may also recommend limiting how much fluid you drink.  Most people with heart failure should eat no more than 2,300 mg of salt (sodium) a day. The amount of sodium that is recommended for you may be lower, depending on your condition.  Contact your health care provider or dietitian before making any major changes to your diet. This information is not intended to replace advice given to you by your health care provider. Make sure you discuss any questions  you have with your health care provider. Document Revised: 12/17/2019 Document Reviewed: 12/17/2019 Elsevier Patient Education  2021 [place].   Heart Failure Medicines  Heart failure is a condition in which the heart cannot pump  enough blood through the body. This can cause shortness of breath, coughing, fatigue, and confusion. Swelling in the feet, ankles, and legs is also common. Medicines for heart failure can strengthen the heart's ability to pump blood and decrease the work it has to do. There is no cure for heart failure. However, taking medicines as directed and living a healthy lifestyle can help you stay active, avoid problems, and live longer. Talk with your health care provider about all medicines that you are taking, how often you should take them, and possible side effects.  Tell a health care provider about:  Any allergies you have.  All medicines you are taking, including vitamins, herbs, eye drops, creams, and over-the-counter medicines.  Any blood disorders you have.  Any other medical conditions you have.  Whether you are pregnant or may be pregnant. Types of medicines The medicines that are prescribed for you will depend on your symptoms, the type of heart failure you have, and the cause of your heart failure. In most cases, you may need to take more than one medicine. Angiotensin-converting enzyme (ACE) inhibitors, angiotensin receptor blockers (ARBs), and angiotensin receptor neprilysin inhibitor (ARNIs)  These medicines help to dilate arteries and veins. This makes it easier for your heart to pump by lowering blood pressure and reducing the strain on your heart. These medicines can help to lessen the symptoms of heart failure.  Common ACE inhibitors include lisinopril and ramipril. Common ARBs include losartan and valsartan. Common ARNIs include sacubitril with valsartan. ? Only take one of these medicines. Do not combine ACE inhibitors, ARBs, or ARNIs. The risk of side effects increases if more than one of these medicines is taken at the same time.  Side effects include dry cough, dizziness, low blood pressure, high potassium levels, and kidney problems. You may need regular checkups and blood  tests to monitor how the medicine is working.  Do not take these medicines if you are pregnant or may become pregnant. They can cause serious birth defects.  In rare cases, these medicines can cause a serious side effect called angioedema. Symptoms include swelling of the tongue, lips, mouth, or throat, trouble breathing, and hoarseness. Stomach pain or swelling and vomiting can also happen. ? If you have any of these symptoms, stop taking the medicine and contact your health care provider right away. If you have had angioedema in the past, talk with your health care provider before starting one of these medicines. Beta-blockers  These medicines slow your heart rate. This helps to improve the heart's ability to pump and lessen its workload.  Common beta-blockers for heart failure are bisoprolol, carvedilol, and long-acting metoprolol (extended-release).  Some beta-blockers can worsen lung diseases that cause wheezing, such as asthma. Talk with your health care provider before taking a beta-blocker if you have a lung disease.  These medicines can hide the symptoms of low blood sugar (glucose), also called hypoglycemia. If you have diabetes, check your blood glucose carefully. If you have hypoglycemia, talk with your health care provider about adjusting your medicines.  This medicine may make you feel dizzy or light-headed at first. Do not drive or use machinery when you first start these medicines. Ask your health care provider when it is safe for you to do this.  Because these medicines slow your heart rate, it is important not to overdo it with exercise. Talk with your health care provider about what your target heart rate should be while you exercise.  Do not stop this medicine suddenly unless your health care provider says to do this. Stopping this medicine suddenly can increase the risk of a heart attack or heart rhythm problems. If it is decided that stopping this medicine is right for you,  your dose will be lowered slowly to prevent these side effects. Diuretics  Diuretics help the body get rid of excess sodium and water by increasing the need to urinate more often. This helps to lessen the amount of blood that the heart needs to pump. They also reduce fluid buildup in the lungs, ankles, and feet.  Common diuretics include furosemide, bumetanide, and hydrochlorothiazide. The amount of water or fluid removed from the body depends on which diuretic is used.  You may be asked to weigh yourself to determine if you have too much or too little fluid. Ask your health care provider what your weight should be and when to contact your health care provider if it changes. Also, ask about how much fluid you should be drinking each day.  These medicines can worsen problems with controlling urination (urinary incontinence). Talk to your health care provider about the best time of day to take this medicine if you have trouble with bladder control.  Side effects include dizziness, headaches, muscle cramps, and an upset stomach.  These medicines can cause dehydration. Symptoms include tiredness, increased thirst, confusion, and decreased urination. Contact your health care provider right away if you think you might be dehydrated.  These medicines can cause your electrolyte levels (magnesium and potassium) and kidney function to change. These levels will be monitored by your health care provider.  These medicines can make you more sensitive to the sun. Keep out of the sun. If you cannot avoid being in the sun, wear protective clothing and use sunscreen. Do not use sun lamps or tanning beds. Aldosterone antagonists  These medicines are a type of diuretic. They get rid of excess sodium and water from the body by increasing the need to urinate. This helps to lessen the amount of blood that the heart needs to pump.  Common aldosterone antagonists include spironolactone and eplerenone.  Side effects  include dizziness, low blood pressure, increased potassium levels, and kidney problems. You may need regular checkups and blood tests to monitor how this medicine is working.  These medicines can raise the amount of potassium in the blood. Your potassium levels will be monitored regularly by your health care provider.  Spironolactone and eplerenone may increase breast size in males and may cause breast tenderness in all patients. Digoxin  Digoxin helps the heart pump blood better. It also lowers your heart rate. It is usually added to other medicines if you are still having heart failure symptoms.  Your health care provider will monitor your digoxin levels with blood tests. Too much digoxin can cause serious problems, such as an irregular heart rhythm.  Early signs of digoxin toxicity include nausea, vomiting, loss of appetite, headaches, confusion, weakness, or vision changes, such as blurred vision or seeing more yellow or green than normal. Contact your health care provider right away if you have any of these signs. Vasodilators  Hydralazine and nitrates relax the blood vessels and lower blood pressure. This helps your heart pump blood better.  Hydralazine and nitrates are usually given with other  medicines to control your heart failure symptoms. You may be given one or both of these medicines, depending on your symptoms.  Side effects include headaches, a flushed face or neck, dizziness, and low blood pressure. If channel blockers  Ivabradine is an If channel blocker. It lowers the heart rate and decreases the heart's workload.  This medicine is usually given with beta-blockers if you have symptoms of heart failure and your heart rate is more than 70 beats per minute while taking a beta-blocker.  Side effects include high blood pressure, brightness in your field of vision, slower than normal heart rate (bradycardia), and heart rhythm problems.  Grapefruit juice may increase the risk of  side effects. It is recommended to avoid this.  Do not take this medicine if you are pregnant or may become pregnant. It can cause serious birth defects. Sodium-glucose cotransporter-2 inhibitors (SGLT-2 inhibitors)  SGLT-2 inhibitors are medicines used to treat type 2 diabetes. They have been shown to improve symptoms and quality of life in people with heart failure, even if they do not have diabetes. They also decrease the rate of hospitalizations and death.  Common SGLT-2 inhibitors include canagliflozin and dapagliflozin.  Side effects include an increase in urinary tract infections, genital fungal infections, increased urination, and kidney problems.  If you have diabetes, there is a small risk of hypoglycemia. There is also a small risk of diabetic ketoacidosis. This happens when your blood sugar is very high and usually requires treatment in the hospital. Summary  When you have heart failure, taking medicines as directed and living a healthy lifestyle can help you stay active, avoid problems, and live longer.  In most cases, you will need to take more than one medicine.  It is important to talk with your health care provider about how often you should take your medicines. Do not skip a dose or change your dosage.  Talk to your health care provider about possible side effects of these medicines. This information is not intended to replace advice given to you by your health care provider. Make sure you discuss any questions you have with your health care provider. Document Revised: 01/14/2020 Document Reviewed: 01/14/2020 Elsevier Patient Education  2021 [place] Your Hypertension Hypertension, also called high blood pressure, is when the force of the blood pressing against the walls of the arteries is too strong. Arteries are blood vessels that carry blood from your heart throughout your body. Hypertension forces the heart to work harder to pump blood and may cause  the arteries to become narrow or stiff. Understanding blood pressure readings Your personal target blood pressure may vary depending on your medical conditions, your age, and other factors. A blood pressure reading includes a higher number over a lower number. Ideally, your blood pressure should be below 120/80. You should know that:  The first, or top, number is called the systolic pressure. It is a measure of the pressure in your arteries as your heart beats.  The second, or bottom number, is called the diastolic pressure. It is a measure of the pressure in your arteries as the heart relaxes. Blood pressure is classified into four stages. Based on your blood pressure reading, your health care provider may use the following stages to determine what type of treatment you need, if any. Systolic pressure and diastolic pressure are measured in a unit called mmHg. Normal  Systolic pressure: below 123456.  Diastolic pressure: below 80. Elevated  Systolic pressure: [id number].  Diastolic pressure:  below 80. Hypertension stage 1  Systolic pressure: 0000000.  Diastolic pressure: [id number]. Hypertension stage 2  Systolic pressure: [id number] or above.  Diastolic pressure: 90 or above. How can this condition affect me? Managing your hypertension is an important responsibility. Over time, hypertension can damage the arteries and decrease blood flow to important parts of the body, including the brain, heart, and kidneys. Having untreated or uncontrolled hypertension can lead to:  A heart attack.  A stroke.  A weakened blood vessel (aneurysm).  Heart failure.  Kidney damage.  Eye damage.  Metabolic syndrome.  Memory and concentration problems.  Vascular dementia. What actions can I take to manage this condition? Hypertension can be managed by making lifestyle changes and possibly by taking medicines. Your health care provider will help you make a plan to bring your blood pressure within a normal  range. Nutrition  Eat a diet that is high in fiber and potassium, and low in salt (sodium), added sugar, and fat. An example eating plan is called the Dietary Approaches to Stop Hypertension (DASH) diet. To eat this way: ? Eat plenty of fresh fruits and vegetables. Try to fill one-half of your plate at each meal with fruits and vegetables. ? Eat whole grains, such as whole-wheat pasta, brown rice, or whole-grain bread. Fill about one-fourth of your plate with whole grains. ? Eat low-fat dairy products. ? Avoid fatty cuts of meat, processed or cured meats, and poultry with skin. Fill about one-fourth of your plate with lean proteins such as fish, chicken without skin, beans, eggs, and tofu. ? Avoid pre-made and processed foods. These tend to be higher in sodium, added sugar, and fat.  Reduce your daily sodium intake. Most people with hypertension should eat less than 1,500 mg of sodium a day.   Lifestyle  Work with your health care provider to maintain a healthy body weight or to lose weight. Ask what an ideal weight is for you.  Get at least 30 minutes of exercise that causes your heart to beat faster (aerobic exercise) most days of the week. Activities may include walking, swimming, or biking.  Include exercise to strengthen your muscles (resistance exercise), such as weight lifting, as part of your weekly exercise routine. Try to do these types of exercises for 30 minutes at least 3 days a week.  Do not use any products that contain nicotine or tobacco, such as cigarettes, e-cigarettes, and chewing tobacco. If you need help quitting, ask your health care provider.  Control any long-term (chronic) conditions you have, such as high cholesterol or diabetes.  Identify your sources of stress and find ways to manage stress. This may include meditation, deep breathing, or making time for fun activities.   Alcohol use  Do not drink alcohol if: ? Your health care provider tells you not to  drink. ? You are pregnant, may be pregnant, or are planning to become pregnant.  If you drink alcohol: ? Limit how much you use to:  0-1 drink a day for women.  0-2 drinks a day for men. ? Be aware of how much alcohol is in your drink. In the [place] one drink equals one 12 oz bottle of beer (355 mL), one 5 oz glass of wine (148 mL), or one 1 oz glass of hard liquor (44 mL). Medicines Your health care provider may prescribe medicine if lifestyle changes are not enough to get your blood pressure under control and if:  Your systolic blood pressure is 130 or higher.  Your diastolic blood pressure is 80 or higher. Take medicines only as told by your health care provider. Follow the directions carefully. Blood pressure medicines must be taken as told by your health care provider. The medicine does not work as well when you skip doses. Skipping doses also puts you at risk for problems. Monitoring Before you monitor your blood pressure:  Do not smoke, drink caffeinated beverages, or exercise within 30 minutes before taking a measurement.  Use the bathroom and empty your bladder (urinate).  Sit quietly for at least 5 minutes before taking measurements. Monitor your blood pressure at home as told by your health care provider. To do this:  Sit with your back straight and supported.  Place your feet flat on the floor. Do not cross your legs.  Support your arm on a flat surface, such as a table. Make sure your upper arm is at heart level.  Each time you measure, take two or three readings one minute apart and record the results. You may also need to have your blood pressure checked regularly by your health care provider.   General information  Talk with your health care provider about your diet, exercise habits, and other lifestyle factors that may be contributing to hypertension.  Review all the medicines you take with your health care provider because there may be side effects or  interactions.  Keep all visits as told by your health care provider. Your health care provider can help you create and adjust your plan for managing your high blood pressure. Where to find more information  National Heart, Lung, and Blood Institute: https://wilson-eaton.com/  American Heart Association: www.heart.org Contact a health care provider if:  You think you are having a reaction to medicines you have taken.  You have repeated (recurrent) headaches.  You feel dizzy.  You have swelling in your ankles.  You have trouble with your vision. Get help right away if:  You develop a severe headache or confusion.  You have unusual weakness or numbness, or you feel faint.  You have severe pain in your chest or abdomen.  You vomit repeatedly.  You have trouble breathing. These symptoms may represent a serious problem that is an emergency. Do not wait to see if the symptoms will go away. Get medical help right away. Call your local emergency services (911 in the [place] Do not drive yourself to the hospital. Summary  Hypertension is when the force of blood pumping through your arteries is too strong. If this condition is not controlled, it may put you at risk for serious complications.  Your personal target blood pressure may vary depending on your medical conditions, your age, and other factors. For most people, a normal blood pressure is less than 120/80.  Hypertension is managed by lifestyle changes, medicines, or both.  Lifestyle changes to help manage hypertension include losing weight, eating a healthy, low-sodium diet, exercising more, stopping smoking, and limiting alcohol. This information is not intended to replace advice given to you by your health care provider. Make sure you discuss any questions you have with your health care provider. Document Revised: 06/08/2019 Document Reviewed: 04/03/2019 Elsevier Patient Education  2021 [place] With Heart  Failure Heart failure is a long-term (chronic) condition in which the heart cannot pump enough blood through the body. When this happens, parts of the body do not get the blood and oxygen they need. There is no cure for heart failure at this time, so it is  important for you to take good care of yourself and follow the treatment plan you set with your health care provider. If you are living with heart failure, there are ways to help you manage the disease. How to manage lifestyle changes Living with heart failure requires you to make changes in your life. Your health care team will teach you about the changes you need to make in order to relieve your symptoms and lower your risk of going to the hospital. Work with your health care provider to develop a treatment plan that works for you. Activity  Ask your health care provider about attending cardiac rehabilitation. These programs include aerobic physical activity, which provides many benefits for your heart.  If no cardiac rehabilitation program is available, ask your health care provider what aerobic exercises are safe for you to do.  Return to your normal activities as told by your health care provider. Ask your health care provider what activities are safe for you.  Pace your daily activities and allow time for rest as needed. Managing stress It is normal to have many emotions about your diagnosis, such as fear, sadness, anger, and loss. If you feel any of these emotions and need help coping, contact your health care provider. Here are some ways to help yourself manage these emotions:  Talk to friends and family members about your condition. They can give you support and guidance. Explain your symptoms to them and, if comfortable, invite them to attend appointments or rehabilitation with you.  Join a support group for people with chronic heart failure. Talking with other people who have the same symptoms may give you new ways of coping with your  disease and your emotions.  Accept help from others. Do not be ashamed if you need help with certain tasks.  Use stress management techniques, such as meditation, breathing exercises, or listening to relaxing music. Conditions such as depression and anxiety are common in persons with heart failure. Pay attention to changes in your mood, emotions, and stress levels. Tell your health care provider if you have any of the following symptoms:  Trouble sleeping or a change in your sleeping patterns.  Feeling sad, down, or depressed more often than not, every day for more than 2 weeks.  Losing interest in activities you normally enjoy.  Feeling irritable or crying for no reason.  Finding yourself worrying about the future often. Work You may need to develop a plan with your health care provider if heart failure interferes with your ability to work. This may include:  Reducing your work hours.  Finding functions that are less active or require less effort.  Planning rest periods during your work hours. Travel  Talk with your health care provider if you plan to travel. There may be circumstances in which your health care provider recommends that you do not travel or that you delay travel until your condition is under control.  When you travel, bring your medicine and a list of your medicines. If you are traveling by air, keep your medicines with you in a carry-on bag.  Consider finding a medical facility in the area you will be traveling to and determine what your health insurance will cover.  If you will be traveling by public transportation (airplane, train, bus), contact the company prior to traveling if you have special needs. This may include needs related to diet, oxygen, a wheelchair, a seating request, or help with luggage.  If you use oxygen, make sure to bring  enough oxygen with you.  If you have a battery-powered device, bring a fully charged extra battery with you.  If you  have a device, bring a note from your health care provider and inform all security screening personnel that you have the device. You may need to go through special screening for safety. Sexual activity  Ask your health care provider when it is safe for you to resume sexual activity.  You may need to start slowly and gradually increase intimacy. You can increase intimacy by doing such things as caressing, touching, and holding each other.  Get regular exercise as told by your health care provider. This can benefit your sex life by building strength and endurance.   Sleep If your condition interferes with your sleep, find ways to improve your sleep quality, such as:  Sleep lying on your side, or sleep with your head elevated by raising the head of your bed or using multiple pillows.  Ask your health care provider about screening for sleep apnea.  Try to go to sleep and wake up at the same times every day.  Sleep in a dark, cool room.  Do not do any physical activity or eating for a few hours before bedtime.  Plan rest periods during the day, but do not take long naps during the day.   Where to find support  Consider talking with: ? Family members. ? Close friends. ? A mental health professional or therapist. ? A member of your church, faith, or community group.  Other sources of support include: ? Local support groups. Ask your health care provider about groups near you. ? Online support groups, such as those found through the American Heart Association: supportnetwork.heart.org ? Local home care agencies, community agencies, or social agencies. ? A palliative care specialist. Palliative care can help you manage symptoms, promote comfort, improve quality of life, and maintain dignity. Where to find more information  American Heart Association: heart.org  National Heart, Lung, and Blood Institute: https://www.hartman-hill.biz/  Centers for Disease Control and Prevention:  https://www.reeves.com/  [employer]: SolutionApps.it Contact a health care provider if:  You have a rapid weight gain.  You have increasing shortness of breath that is unusual for you.  You are unable to participate in your usual physical activities.  You tire easily.  You have difficulty sleeping, such as: ? You wake up feeling short of breath. ? You have to use more pillows to raise your head in order to sleep.  You cough more than normal, especially with physical activity.  You have any swelling or more swelling in areas such as your hands, feet, ankles, or abdomen.  You become dizzy or light-headed when you stand up.  You have changes to your appetite.  You have symptoms of depression or anxiety. Get help right away if:  You have difficulty breathing.  You notice or your family notices a change in your awareness, such as having trouble staying awake or having difficulty with concentration.  You have pain or discomfort in your chest.  You have an episode of fainting (syncope).  You feel like your heart is beating quickly (palpitations).  You have extreme feelings of sadness or loss of hope, or you have thoughts about hurting yourself or others. These symptoms may represent a serious problem that is an emergency. Do not wait to see if the symptoms will go away. Get medical help right away. Call your local emergency services (911 in the [place] Do not drive  yourself to the hospital. Summary  There is no cure for heart failure, so it is important for you to take good care of yourself and follow the treatment plan set by your health care provider.  Ask your health care provider about attending cardiac rehabilitation. These programs include aerobic physical activity, which provides many benefits for your heart.  It is normal to have many emotions about your diagnosis, such as fear, sadness, anger, and loss. If you feel any of these emotions and need help  coping, contact your health care provider.  You may need to develop a plan with your health care provider if heart failure interferes with your ability to work. This information is not intended to replace advice given to you by your health care provider. Make sure you discuss any questions you have with your health care provider. Document Revised: 12/17/2019 Document Reviewed: 12/17/2019 Elsevier Patient Education  2021 [place].   Hypertension, Adult Hypertension is another name for high blood pressure. High blood pressure forces your heart to work harder to pump blood. This can cause problems over time. There are two numbers in a blood pressure reading. There is a top number (systolic) over a bottom number (diastolic). It is best to have a blood pressure that is below 120/80. Healthy choices can help lower your blood pressure, or you may need medicine to help lower it. What are the causes? The cause of this condition is not known. Some conditions may be related to high blood pressure. What increases the risk?  Smoking.  Having type 2 diabetes mellitus, high cholesterol, or both.  Not getting enough exercise or physical activity.  Being overweight.  Having too much fat, sugar, calories, or salt (sodium) in your diet.  Drinking too much alcohol.  Having long-term (chronic) kidney disease.  Having a family history of high blood pressure.  Age. Risk increases with age.  Race. You may be at higher risk if you are African American.  Gender. Men are at higher risk than women before age 63. After age 62, women are at higher risk than men.  Having obstructive sleep apnea.  Stress. What are the signs or symptoms?  High blood pressure may not cause symptoms. Very high blood pressure (hypertensive crisis) may cause: ? Headache. ? Feelings of worry or nervousness (anxiety). ? Shortness of breath. ? Nosebleed. ? A feeling of being sick to your stomach (nausea). ? Throwing up  (vomiting). ? Changes in how you see. ? Very bad chest pain. ? Seizures. How is this treated?  This condition is treated by making healthy lifestyle changes, such as: ? Eating healthy foods. ? Exercising more. ? Drinking less alcohol.  Your health care provider may prescribe medicine if lifestyle changes are not enough to get your blood pressure under control, and if: ? Your top number is above 130. ? Your bottom number is above 80.  Your personal target blood pressure may vary. Follow these instructions at home: Eating and drinking  If told, follow the DASH eating plan. To follow this plan: ? Fill one half of your plate at each meal with fruits and vegetables. ? Fill one fourth of your plate at each meal with whole grains. Whole grains include whole-wheat pasta, brown rice, and whole-grain bread. ? Eat or drink low-fat dairy products, such as skim milk or low-fat yogurt. ? Fill one fourth of your plate at each meal with low-fat (lean) proteins. Low-fat proteins include fish, chicken without skin, eggs, beans, and tofu. ?  Avoid fatty meat, cured and processed meat, or chicken with skin. ? Avoid pre-made or processed food.  Eat less than 1,500 mg of salt each day.  Do not drink alcohol if: ? Your doctor tells you not to drink. ? You are pregnant, may be pregnant, or are planning to become pregnant.  If you drink alcohol: ? Limit how much you use to:  0-1 drink a day for women.  0-2 drinks a day for men. ? Be aware of how much alcohol is in your drink. In the [place] one drink equals one 12 oz bottle of beer (355 mL), one 5 oz glass of wine (148 mL), or one 1 oz glass of hard liquor (44 mL).   Lifestyle  Work with your doctor to stay at a healthy weight or to lose weight. Ask your doctor what the best weight is for you.  Get at least 30 minutes of exercise most days of the week. This may include walking, swimming, or biking.  Get at least 30 minutes of exercise that  strengthens your muscles (resistance exercise) at least 3 days a week. This may include lifting weights or doing Pilates.  Do not use any products that contain nicotine or tobacco, such as cigarettes, e-cigarettes, and chewing tobacco. If you need help quitting, ask your doctor.  Check your blood pressure at home as told by your doctor.  Keep all follow-up visits as told by your doctor. This is important.   Medicines  Take over-the-counter and prescription medicines only as told by your doctor. Follow directions carefully.  Do not skip doses of blood pressure medicine. The medicine does not work as well if you skip doses. Skipping doses also puts you at risk for problems.  Ask your doctor about side effects or reactions to medicines that you should watch for. Contact a doctor if you:  Think you are having a reaction to the medicine you are taking.  Have headaches that keep coming back (recurring).  Feel dizzy.  Have swelling in your ankles.  Have trouble with your vision. Get help right away if you:  Get a very bad headache.  Start to feel mixed up (confused).  Feel weak or numb.  Feel faint.  Have very bad pain in your: ? Chest. ? Belly (abdomen).  Throw up more than once.  Have trouble breathing. Summary  Hypertension is another name for high blood pressure.  High blood pressure forces your heart to work harder to pump blood.  For most people, a normal blood pressure is less than 120/80.  Making healthy choices can help lower blood pressure. If your blood pressure does not get lower with healthy choices, you may need to take medicine. This information is not intended to replace advice given to you by your health care provider. Make sure you discuss any questions you have with your health care provider. Document Revised: 01/11/2018 Document Reviewed: 01/11/2018 Elsevier Patient Education  2021 [place] With Heart Failure Heart  failure is a serious condition. It means that the heart cannot fill or pump enough blood and oxygen to support the body and its functions. Caregivers provide physical and emotional support to friends or family members with heart failure. If you are supporting someone who has heart failure, you play an important role in making sure that the person follows schedules, takes medicines, goes to appointments, and follows his or her overall treatment and rehabilitation plan. How does this condition affect the person I  am caring for? The person with heart failure may have many changes to his or her daily routine. The person may need to:  Make diet changes.  Make changes to his or her physical activity and exercise routines.  Take medicines on a regular basis.  Have some procedures or surgery.  Have rehabilitation treatment. What actions can I take to help someone manage this condition? Planning for discharge from the hospital Before your friend or family member leaves the hospital after treatment, make sure you understand:  The recovery process after heart failure and the physical, emotional, behavior, and other changes that occur in the person with heart failure.  The medicines that the person with heart failure has to take and when to take them.  Whether you need to get certain devices or equipment for the person with heart failure.  The treatments for heart failure.  How to lower the person's risk of having other heart problems.  Diet and exercise changes for the person with heart failure.  What symptoms to watch for and when emergency medical care is needed.  How to contact the person's health care provider when you have questions about the person's care. You may want to appoint one family member as the main contact. Providing general care and support  Help the person follow his or her treatment plan as directed by the health care provider. This could include taking notes at health  care visits and helping the person keep track of information.  Learn as much as you can about heart failure.  Find out what extra help the person may need at home. In some cases, a person with heart failure will need help using the bathroom, bathing, eating, or doing other activities. Make sure you know what changes you have to make at home to make the person safe.  Ask the person how you can support him or her.  Remember that your support really matters. Social support is a huge benefit for someone who is coping with illness. How to care for yourself As a caregiver, it is important to take care of yourself so that you can better meet the needs of the person with heart failure and also meet your own needs. Recognizing stress It is normal to have many different emotions while caring for someone with heart failure. The lifestyle changes that come with this diagnosis can be stressful and difficult, leading to emotional, physical, and mental exhaustion (caregiver burnout). Signs of this include:  Sleep problems.  Trouble concentrating.  Changes in appetite.  Depressed mood and mood swings.  Feeling unmotivated to do anything.  Feeling emotionally out of control.  Neglecting yourself or the person with heart failure, including forgetting to take or give medicines or keep appointments. Taking steps to care for yourself  Monitor yourself for caregiver burnout. This can happen if you feel overwhelmed.  Ask friends and family for help.  Get counseling or therapy.  Take time for yourself.  Relax, exercise, and eat healthy foods.  Maintain a sense of self outside of being a caregiver.  Insist on getting respite care. Respite care can provide short-term care for the person so that you can have a regular break from the stress of caregiving. Where to find support  Ask the person's health care provider for help or referrals to resources.  Consider taking classes to learn more about  supporting someone with heart failure. Take a class to become certified in CPR.  Join a support group for people who  are caring for someone with heart failure.   Where to find more information  American Heart Association: www.heart.org Summary  Before your friend or family member leaves the hospital, make sure you understand how to care for someone with heart failure.  It is normal to have many different emotions while caring for someone with heart failure.  Take care of yourself by asking for help and taking time to relax.  Monitor yourself for caregiver burnout and know when to ask for help. This information is not intended to replace advice given to you by your health care provider. Make sure you discuss any questions you have with your health care provider. Document Revised: 12/17/2019 Document Reviewed: 12/17/2019 Elsevier Patient Education  2021 Elsevier Inc.    IMPORTANT INFORMATION: PAY CLOSE ATTENTION   PHYSICIAN DISCHARGE INSTRUCTIONS  Follow with Primary care provider  Rosita Fire, MD  and other consultants as instructed by your Hospitalist Physician  [place] IF SYMPTOMS COME BACK, WORSEN OR NEW PROBLEM DEVELOPS   Please note: You were cared for by a hospitalist during your hospital stay. Every effort will be made to forward records to your primary care provider.  You can request that your primary care provider send for your hospital records if they have not received them.  Once you are discharged, your primary care physician will handle any further medical issues. Please note that NO REFILLS for any discharge medications will be authorized once you are discharged, as it is imperative that you return to your primary care physician (or establish a relationship with a primary care physician if you do not have one) for your post hospital discharge needs so that they can reassess your need for medications and monitor your lab  values.  Please get a complete blood count and chemistry panel checked by your Primary MD at your next visit, and again as instructed by your Primary MD.  Get Medicines reviewed and adjusted: Please take all your medications with you for your next visit with your Primary MD  Laboratory/radiological data: Please request your Primary MD to go over all hospital tests and procedure/radiological results at the follow up, please ask your primary care provider to get all Hospital records sent to his/her office.  In some cases, they will be blood work, cultures and biopsy results pending at the time of your discharge. Please request that your primary care provider follow up on these results.  If you are diabetic, please bring your blood sugar readings with you to your follow up appointment with primary care.    Please call and make your follow up appointments as soon as possible.    Also Note the following: If you experience worsening of your admission symptoms, develop shortness of breath, life threatening emergency, suicidal or homicidal thoughts you must seek medical attention immediately by calling 911 or calling your MD immediately  if symptoms less severe.  You must read complete instructions/literature along with all the possible adverse reactions/side effects for all the Medicines you take and that have been prescribed to you. Take any new Medicines after you have completely understood and accpet all the possible adverse reactions/side effects.   Do not drive when taking Pain medications or sleeping medications (Benzodiazepines)  Do not take more than prescribed Pain, Sleep and Anxiety Medications. It is not advisable to combine anxiety,sleep and pain medications without talking with your primary care practitioner  Special Instructions: If you have smoked or chewed Tobacco  in  the last 2 yrs please stop smoking, stop any regular Alcohol  and or any Recreational drug use.  Wear Seat belts  while driving.  Do not drive if taking any narcotic, mind altering or controlled substances or recreational drugs or alcohol.

## 2020-09-18 ENCOUNTER — Ambulatory Visit: Payer: Medicaid Other | Admitting: Nurse Practitioner

## 2020-09-18 NOTE — Patient Instructions (Incomplete)

## 2020-09-30 ENCOUNTER — Other Ambulatory Visit: Payer: Medicaid Other

## 2020-10-07 ENCOUNTER — Inpatient Hospital Stay (HOSPITAL_COMMUNITY)
Admission: EM | Admit: 2020-10-07 | Discharge: 2020-10-13 | DRG: 291 | Disposition: A | Discharge status: Home Health | Payer: Medicaid Other | Attending: Family Medicine | Admitting: Family Medicine

## 2020-10-07 ENCOUNTER — Emergency Department (HOSPITAL_COMMUNITY): Payer: Medicaid Other

## 2020-10-07 ENCOUNTER — Encounter (HOSPITAL_COMMUNITY): Payer: Self-pay

## 2020-10-07 ENCOUNTER — Other Ambulatory Visit: Payer: Self-pay

## 2020-10-07 DIAGNOSIS — Z9989 Dependence on other enabling machines and devices: Secondary | ICD-10-CM | POA: Diagnosis not present

## 2020-10-07 DIAGNOSIS — K5732 Diverticulitis of large intestine without perforation or abscess without bleeding: Secondary | ICD-10-CM | POA: Diagnosis present

## 2020-10-07 DIAGNOSIS — I5043 Acute on chronic combined systolic (congestive) and diastolic (congestive) heart failure: Secondary | ICD-10-CM | POA: Diagnosis present

## 2020-10-07 DIAGNOSIS — Z833 Family history of diabetes mellitus: Secondary | ICD-10-CM

## 2020-10-07 DIAGNOSIS — J9621 Acute and chronic respiratory failure with hypoxia: Secondary | ICD-10-CM

## 2020-10-07 DIAGNOSIS — E669 Obesity, unspecified: Secondary | ICD-10-CM | POA: Diagnosis present

## 2020-10-07 DIAGNOSIS — Z888 Allergy status to other drugs, medicaments and biological substances status: Secondary | ICD-10-CM | POA: Diagnosis not present

## 2020-10-07 DIAGNOSIS — I428 Other cardiomyopathies: Secondary | ICD-10-CM | POA: Diagnosis present

## 2020-10-07 DIAGNOSIS — I110 Hypertensive heart disease with heart failure: Principal | ICD-10-CM | POA: Diagnosis present

## 2020-10-07 DIAGNOSIS — Z79899 Other long term (current) drug therapy: Secondary | ICD-10-CM | POA: Diagnosis not present

## 2020-10-07 DIAGNOSIS — Z20822 Contact with and (suspected) exposure to covid-19: Secondary | ICD-10-CM | POA: Diagnosis present

## 2020-10-07 DIAGNOSIS — E66812 Obesity, class 2: Secondary | ICD-10-CM | POA: Diagnosis present

## 2020-10-07 DIAGNOSIS — Z794 Long term (current) use of insulin: Secondary | ICD-10-CM | POA: Diagnosis not present

## 2020-10-07 DIAGNOSIS — Z6835 Body mass index (BMI) 35.0-35.9, adult: Secondary | ICD-10-CM | POA: Diagnosis not present

## 2020-10-07 DIAGNOSIS — G4733 Obstructive sleep apnea (adult) (pediatric): Secondary | ICD-10-CM | POA: Diagnosis present

## 2020-10-07 DIAGNOSIS — Z90710 Acquired absence of both cervix and uterus: Secondary | ICD-10-CM | POA: Diagnosis not present

## 2020-10-07 DIAGNOSIS — R0600 Dyspnea, unspecified: Secondary | ICD-10-CM

## 2020-10-07 DIAGNOSIS — E1149 Type 2 diabetes mellitus with other diabetic neurological complication: Secondary | ICD-10-CM | POA: Diagnosis present

## 2020-10-07 DIAGNOSIS — Z83438 Family history of other disorder of lipoprotein metabolism and other lipidemia: Secondary | ICD-10-CM | POA: Diagnosis not present

## 2020-10-07 DIAGNOSIS — K5792 Diverticulitis of intestine, part unspecified, without perforation or abscess without bleeding: Secondary | ICD-10-CM | POA: Diagnosis not present

## 2020-10-07 DIAGNOSIS — I509 Heart failure, unspecified: Secondary | ICD-10-CM | POA: Diagnosis present

## 2020-10-07 DIAGNOSIS — Z7982 Long term (current) use of aspirin: Secondary | ICD-10-CM | POA: Diagnosis not present

## 2020-10-07 DIAGNOSIS — I10 Essential (primary) hypertension: Secondary | ICD-10-CM | POA: Diagnosis not present

## 2020-10-07 DIAGNOSIS — Z79890 Hormone replacement therapy (postmenopausal): Secondary | ICD-10-CM

## 2020-10-07 DIAGNOSIS — Z8249 Family history of ischemic heart disease and other diseases of the circulatory system: Secondary | ICD-10-CM

## 2020-10-07 DIAGNOSIS — Z885 Allergy status to narcotic agent status: Secondary | ICD-10-CM

## 2020-10-07 DIAGNOSIS — E1165 Type 2 diabetes mellitus with hyperglycemia: Secondary | ICD-10-CM | POA: Diagnosis present

## 2020-10-07 DIAGNOSIS — N179 Acute kidney failure, unspecified: Secondary | ICD-10-CM | POA: Diagnosis present

## 2020-10-07 DIAGNOSIS — D649 Anemia, unspecified: Secondary | ICD-10-CM | POA: Diagnosis not present

## 2020-10-07 DIAGNOSIS — D509 Iron deficiency anemia, unspecified: Secondary | ICD-10-CM | POA: Diagnosis present

## 2020-10-07 DIAGNOSIS — K5900 Constipation, unspecified: Secondary | ICD-10-CM

## 2020-10-07 DIAGNOSIS — I5041 Acute combined systolic (congestive) and diastolic (congestive) heart failure: Secondary | ICD-10-CM

## 2020-10-07 DIAGNOSIS — I5042 Chronic combined systolic (congestive) and diastolic (congestive) heart failure: Secondary | ICD-10-CM

## 2020-10-07 LAB — TROPONIN I (HIGH SENSITIVITY)
Troponin I (High Sensitivity): 17 ng/L (ref <18)
Troponin I (High Sensitivity): 12 ng/L (ref <18)

## 2020-10-07 LAB — BASIC METABOLIC PANEL
Anion gap: 9 (ref 5–15)
BUN: 16 mg/dL (ref 6–20)
CO2: 23 mmol/L (ref 22–32)
Calcium: 8.9 mg/dL (ref 8.9–10.3)
Chloride: 105 mmol/L (ref 98–111)
Creatinine, Ser: 0.96 mg/dL (ref 0.44–1.00)
GFR, Estimated: >60 mL/min (ref >60)
Glucose, Bld: 127 mg/dL — ABNORMAL HIGH (ref 70–99)
Potassium: 3.6 mmol/L (ref 3.5–5.1)
Sodium: 137 mmol/L (ref 135–145)

## 2020-10-07 LAB — CBC
HCT: 36.2 % (ref 36.0–46.0)
Hemoglobin: 11.1 g/dL — ABNORMAL LOW (ref 12.0–15.0)
MCH: 22.8 pg — ABNORMAL LOW (ref 26.0–34.0)
MCHC: 30.7 g/dL (ref 30.0–36.0)
MCV: 74.5 fL — ABNORMAL LOW (ref 80.0–100.0)
Platelets: 254 10*3/uL (ref 150–400)
RBC: 4.86 MIL/uL (ref 3.87–5.11)
RDW: 15.7 % — ABNORMAL HIGH (ref 11.5–15.5)
WBC: 9.4 10*3/uL (ref 4.0–10.5)
nRBC: 0 % (ref 0.0–0.2)

## 2020-10-07 LAB — HEPATIC FUNCTION PANEL
ALT: 14 U/L (ref 0–44)
AST: 17 U/L (ref 15–41)
Albumin: 3.3 g/dL — ABNORMAL LOW (ref 3.5–5.0)
Alkaline Phosphatase: 72 U/L (ref 38–126)
Bilirubin, Direct: 0.2 mg/dL (ref 0.0–0.2)
Indirect Bilirubin: 0.8 mg/dL (ref 0.3–0.9)
Total Bilirubin: 1 mg/dL (ref 0.3–1.2)
Total Protein: 7.4 g/dL (ref 6.5–8.1)

## 2020-10-07 LAB — BRAIN NATRIURETIC PEPTIDE: B Natriuretic Peptide: 2225 pg/mL — ABNORMAL HIGH (ref 0.0–100.0)

## 2020-10-07 LAB — HIV ANTIBODY (ROUTINE TESTING W REFLEX): HIV Screen 4th Generation wRfx: NONREACTIVE

## 2020-10-07 LAB — SARS CORONAVIRUS 2 (TAT 6-24 HRS): SARS Coronavirus 2: NEGATIVE

## 2020-10-07 LAB — GLUCOSE, CAPILLARY
Glucose-Capillary: 129 mg/dL — ABNORMAL HIGH (ref 70–99)
Glucose-Capillary: 152 mg/dL — ABNORMAL HIGH (ref 70–99)

## 2020-10-07 MED ORDER — AMLODIPINE BESYLATE 5 MG PO TABS: 10.0000 mg | ORAL_TABLET | Freq: Every day | ORAL | Status: DC

## 2020-10-07 MED ORDER — POTASSIUM CHLORIDE CRYS ER 20 MEQ PO TBCR
20.0000 meq | EXTENDED_RELEASE_TABLET | Freq: Every day | ORAL | Status: DC
  Administered 2020-10-07: 20 meq via ORAL
  Filled 2020-10-07 (×2): qty 1

## 2020-10-07 MED ORDER — ONDANSETRON HCL 4 MG PO TABS
4.0000 mg | ORAL_TABLET | Freq: Four times a day (QID) | ORAL | Status: DC | PRN
  Administered 2020-10-07: 4 mg via ORAL
  Filled 2020-10-07: qty 1

## 2020-10-07 MED ORDER — INSULIN GLARGINE 100 UNIT/ML ~~LOC~~ SOLN
30.0000 [IU] | Freq: Every day | SUBCUTANEOUS | Status: DC
  Administered 2020-10-07 – 2020-10-12 (×6): 30 [IU] via SUBCUTANEOUS
  Filled 2020-10-07 (×7): qty 0.3

## 2020-10-07 MED ORDER — SODIUM CHLORIDE 0.9% FLUSH
3.0000 mL | INTRAVENOUS | Status: DC | PRN
  Administered 2020-10-11: 3 mL via INTRAVENOUS

## 2020-10-07 MED ORDER — ALBUTEROL SULFATE (2.5 MG/3ML) 0.083% IN NEBU: 3.0000 mL | INHALATION_SOLUTION | Freq: Four times a day (QID) | RESPIRATORY_TRACT | Status: DC | PRN

## 2020-10-07 MED ORDER — MOMETASONE FURO-FORMOTEROL FUM 100-5 MCG/ACT IN AERO
2.0000 | INHALATION_SPRAY | Freq: Two times a day (BID) | RESPIRATORY_TRACT | Status: DC
  Administered 2020-10-07 – 2020-10-13 (×12): 2 via RESPIRATORY_TRACT
  Filled 2020-10-07: qty 8.8

## 2020-10-07 MED ORDER — PYRIDOSTIGMINE BROMIDE 60 MG PO TABS
60.0000 mg | ORAL_TABLET | Freq: Three times a day (TID) | ORAL | Status: DC
  Administered 2020-10-07 – 2020-10-13 (×18): 60 mg via ORAL
  Filled 2020-10-07 (×18): qty 1

## 2020-10-07 MED ORDER — GABAPENTIN 400 MG PO CAPS
800.0000 mg | ORAL_CAPSULE | Freq: Three times a day (TID) | ORAL | Status: DC
  Administered 2020-10-07 – 2020-10-13 (×18): 800 mg via ORAL
  Filled 2020-10-07 (×18): qty 2

## 2020-10-07 MED ORDER — SODIUM CHLORIDE 0.9% FLUSH
3.0000 mL | Freq: Two times a day (BID) | INTRAVENOUS | Status: DC
  Administered 2020-10-07 – 2020-10-13 (×11): 3 mL via INTRAVENOUS

## 2020-10-07 MED ORDER — FUROSEMIDE 10 MG/ML IJ SOLN
80.0000 mg | Freq: Three times a day (TID) | INTRAMUSCULAR | Status: DC
  Administered 2020-10-07: 80 mg via INTRAVENOUS
  Filled 2020-10-07: qty 8

## 2020-10-07 MED ORDER — ONDANSETRON HCL 4 MG/2ML IJ SOLN
4.0000 mg | Freq: Four times a day (QID) | INTRAMUSCULAR | Status: DC | PRN
  Administered 2020-10-09: 4 mg via INTRAVENOUS
  Filled 2020-10-07 (×2): qty 2

## 2020-10-07 MED ORDER — FUROSEMIDE 10 MG/ML IJ SOLN
80.0000 mg | Freq: Two times a day (BID) | INTRAMUSCULAR | Status: DC
  Filled 2020-10-07: qty 8

## 2020-10-07 MED ORDER — CARVEDILOL 12.5 MG PO TABS
50.0000 mg | ORAL_TABLET | Freq: Two times a day (BID) | ORAL | Status: DC
  Administered 2020-10-07 – 2020-10-12 (×8): 50 mg via ORAL
  Filled 2020-10-07 (×11): qty 4

## 2020-10-07 MED ORDER — TRAZODONE HCL 50 MG PO TABS
200.0000 mg | ORAL_TABLET | Freq: Every day | ORAL | Status: DC
  Administered 2020-10-07 – 2020-10-12 (×6): 200 mg via ORAL
  Filled 2020-10-07 (×6): qty 4

## 2020-10-07 MED ORDER — POLYETHYLENE GLYCOL 3350 17 G PO PACK
17.0000 g | PACK | Freq: Every day | ORAL | Status: DC | PRN
  Administered 2020-10-07: 17 g via ORAL
  Filled 2020-10-07: qty 1

## 2020-10-07 MED ORDER — HYDRALAZINE HCL 25 MG PO TABS
100.0000 mg | ORAL_TABLET | Freq: Three times a day (TID) | ORAL | Status: DC
  Administered 2020-10-07 – 2020-10-11 (×8): 100 mg via ORAL
  Filled 2020-10-07 (×14): qty 4

## 2020-10-07 MED ORDER — SODIUM CHLORIDE 0.9 % IV SOLN
250.0000 mL | INTRAVENOUS | Status: DC | PRN
  Administered 2020-10-11: 250 mL via INTRAVENOUS

## 2020-10-07 MED ORDER — ACETAMINOPHEN 650 MG RE SUPP: 650.0000 mg | Freq: Four times a day (QID) | RECTAL | Status: DC | PRN

## 2020-10-07 MED ORDER — SPIRONOLACTONE 25 MG PO TABS
12.5000 mg | ORAL_TABLET | Freq: Every day | ORAL | Status: DC
  Administered 2020-10-08 – 2020-10-13 (×5): 12.5 mg via ORAL
  Filled 2020-10-07: qty 0.5
  Filled 2020-10-07 (×3): qty 1
  Filled 2020-10-07: qty 0.5
  Filled 2020-10-07: qty 1
  Filled 2020-10-07 (×2): qty 0.5
  Filled 2020-10-07: qty 1
  Filled 2020-10-07: qty 0.5
  Filled 2020-10-07: qty 1
  Filled 2020-10-07 (×3): qty 0.5

## 2020-10-07 MED ORDER — NITROGLYCERIN 0.4 MG SL SUBL
0.4000 mg | SUBLINGUAL_TABLET | Freq: Once | SUBLINGUAL | Status: AC
  Administered 2020-10-07: 0.4 mg via SUBLINGUAL
  Filled 2020-10-07: qty 1

## 2020-10-07 MED ORDER — SODIUM CHLORIDE 0.9% FLUSH
3.0000 mL | Freq: Two times a day (BID) | INTRAVENOUS | Status: DC
  Administered 2020-10-07 – 2020-10-13 (×7): 3 mL via INTRAVENOUS

## 2020-10-07 MED ORDER — ACETAMINOPHEN 325 MG PO TABS
650.0000 mg | ORAL_TABLET | Freq: Four times a day (QID) | ORAL | Status: DC | PRN
  Administered 2020-10-07 – 2020-10-11 (×2): 650 mg via ORAL
  Filled 2020-10-07 (×3): qty 2

## 2020-10-07 MED ORDER — HEPARIN SODIUM (PORCINE) 5000 UNIT/ML IJ SOLN
5000.0000 [IU] | Freq: Three times a day (TID) | INTRAMUSCULAR | Status: DC
  Administered 2020-10-07 – 2020-10-12 (×15): 5000 [IU] via SUBCUTANEOUS
  Filled 2020-10-07 (×16): qty 1

## 2020-10-07 MED ORDER — ISOSORBIDE MONONITRATE ER 60 MG PO TB24
30.0000 mg | ORAL_TABLET | Freq: Every day | ORAL | Status: DC
  Administered 2020-10-08 – 2020-10-13 (×5): 30 mg via ORAL
  Filled 2020-10-07 (×5): qty 1

## 2020-10-07 MED ORDER — POTASSIUM CHLORIDE CRYS ER 20 MEQ PO TBCR
40.0000 meq | EXTENDED_RELEASE_TABLET | Freq: Once | ORAL | Status: AC
  Administered 2020-10-07: 40 meq via ORAL
  Filled 2020-10-07: qty 2

## 2020-10-07 MED ORDER — CLONAZEPAM 0.5 MG PO TABS
0.5000 mg | ORAL_TABLET | Freq: Two times a day (BID) | ORAL | Status: DC
  Administered 2020-10-07 – 2020-10-13 (×12): 0.5 mg via ORAL
  Filled 2020-10-07 (×12): qty 1

## 2020-10-07 MED ORDER — LABETALOL HCL 5 MG/ML IV SOLN: 10.0000 mg | INTRAVENOUS | Status: DC | PRN

## 2020-10-07 MED ORDER — BISACODYL 10 MG RE SUPP
10.0000 mg | Freq: Every day | RECTAL | Status: DC | PRN
  Filled 2020-10-07: qty 1

## 2020-10-07 MED ORDER — LINACLOTIDE 145 MCG PO CAPS
145.0000 ug | ORAL_CAPSULE | Freq: Every day | ORAL | Status: DC
  Administered 2020-10-08 – 2020-10-10 (×3): 145 ug via ORAL
  Filled 2020-10-07 (×4): qty 1

## 2020-10-07 MED ORDER — FUROSEMIDE 10 MG/ML IJ SOLN
80.0000 mg | Freq: Once | INTRAMUSCULAR | Status: AC
  Administered 2020-10-07: 80 mg via INTRAVENOUS
  Filled 2020-10-07: qty 8

## 2020-10-07 MED ORDER — ARIPIPRAZOLE 5 MG PO TABS
10.0000 mg | ORAL_TABLET | Freq: Every day | ORAL | Status: DC
  Administered 2020-10-08 – 2020-10-13 (×6): 10 mg via ORAL
  Filled 2020-10-07 (×6): qty 2

## 2020-10-07 MED ORDER — ASPIRIN EC 81 MG PO TBEC
81.0000 mg | DELAYED_RELEASE_TABLET | Freq: Every day | ORAL | Status: DC
  Administered 2020-10-08 – 2020-10-13 (×6): 81 mg via ORAL
  Filled 2020-10-07 (×6): qty 1

## 2020-10-07 NOTE — ED Triage Notes (Signed)
Pt presents to ED with complaints of chest pain started yesterday, cough started 2 days ago. Pt with hx CHF. BP 180/135

## 2020-10-07 NOTE — H&P (Signed)
Patient Demographics:    Chelsey Santiago, is a 45 y.o. 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) Active Problems:   Uncontrolled type 2 diabetes mellitus with hyperglycemia (HCC)   Chronic anemia   OSA on CPAP   Hypertension   Class 2 obesity    1) Acute on Chronic Combined Systolic and Diastolic CHF/NICM - H/O NICM with EF at 40-45% by echo in 12/2015 with cath showing normal cors and most recent echo in 03/2020 showed her EF was at 35% on 09/11/20 despite patient adjusting torsemide PTA weight gain and dyspnea worsened -Patient received Lasix 80 mg in the ED and voided 1.5 L -Patient admitted dietary indiscretion with excessive fluid and salt intake --Chest x-ray is consistent with CHF -Troponin is 17, repeat troponin 12, EKG with sinus tach and LVH -BNP Up to 2225, BNP was 261 on 09/13/2020 and 388 on 09/12/2020 -Continue IV Lasix, daily weight and daily fluid input and output -May repeat chest x-ray after diuresis -Continue Aldactone 12.5 mg twice daily, continue Coreg -Also continue hydralazine/isosorbide combo (I have discontinued amlodipine and added isosorbide instead)  2)chronic hypochromic microcytic anemia-----patient is status post prior hysterectomy --CBC with hemoglobin of 11.1 MCV and MCH are low -Anemia work-up including FOBT and labs ordered  3)Class 2 obesity/OSA-  -Low calorie diet, portion control and increase physical activity discussed with patient -Body mass index is 35.51 kg/m.  4)DM2--recent A1c 9.8 reflecting uncontrolled diabetes with hyperglycemia  PTA -Lantus 30 units nightly Use Novolog/Humalog Sliding scale insulin with Accu-Cheks/Fingersticks as ordered   5)HTN-cardiovascular medications adjusted as above #1 May use IV labetalol as needed elevated BP   Disposition/Need for in-Hospital Stay- patient unable to be discharged at this time due to --acute on chronic CHF exacerbation requiring IV diuresis and close monitoring of electrolytes and renal function while diuresing--- patient failed oral diuretics PTA-  Status is: Inpatient  Remains inpatient appropriate because:Please see disposition above   Dispo: The patient is from: Home              Anticipated d/c is to: Home              Anticipated d/c date is: 2 days              Patient currently is not medically stable to d/c. Barriers: Not Clinically Stable-    With History of - Reviewed by me  Past Medical History:  Diagnosis Date  . Anemia    H&H of 10.6/33 and 07/2008 and 11.9/35 and 09/2010  . Anxiety   . Chronic combined systolic and diastolic CHF (congestive heart failure) (HCC)    a. EF 40-45% by echo in 12/2015 with cath showing  normal cors b. EF at 45% by repeat echo in 10/2018 c. EF at 30-35% in 03/2020  . Depression with anxiety   . Diabetes mellitus without complication ([place])   . Hypertension   . Hypertensive heart disease 2009   Pulmonary edema postpartum; mild to moderate mitral regurgitation when hospitalized for CHF in 2009; Echocardiogram in 12/2009-no MR and normal EF; normal CXR in 09/2010  . Migraine headache   . Miscarriage 03/19/2013  . Obesity 04/16/2009  . Osteoarthritis, knee 03/29/2011  . Preeclampsia   . Pulmonary edema   . Sleep apnea   . Threatened abortion in early pregnancy 03/15/2013      Past Surgical History:  Procedure Laterality Date  . BREAST REDUCTION SURGERY  2002  . CARDIAC CATHETERIZATION N/A 12/22/2015   Procedure: Left Heart Cath and Coronary Angiography;  Surgeon: Peter M Martinique, MD;  Location: [facility];   Service: Cardiovascular;  Laterality: N/A;  . CESAREAN SECTION N/A 04/09/2014   Procedure: CESAREAN SECTION;  Surgeon: Mora Bellman, MD;  Location: [facility];  Service: Obstetrics;  Laterality: N/A;  . CHOLECYSTECTOMY    . HYSTERECTOMY ABDOMINAL WITH SALPINGECTOMY N/A 09/26/2019   Procedure: HYSTERECTOMY ABDOMINAL WITH SALPINGECTOMY;  Surgeon: Florian Buff, MD;  Location: [facility];  Service: Gynecology;  Laterality: N/A;  . LIPOMA EXCISION Right 09/26/2019   Procedure: EXCISION LIPOMA RIGHT VULVAR;  Surgeon: Florian Buff, MD;  Location: [facility];  Service: Gynecology;  Laterality: Right;      Chief Complaint  Patient presents with  . Chest Pain      HPI:    Chelsey Santiago  is a 45 y.o. female with past medical history relevant for obesity/OSA, as well as HTN, DM2,  chronic combined systolic and diastolic CHF/NICM(EF 44-03% by echo in 12/2015 with cath showing normal cors, EF at 45% by repeat echo in 10/2018, at 35% in 09/11/2020- presents to the ED with complaints of dyspnea and chest discomfort -No fever  Or chills , no productive cough no pleuritic symptoms -Patient noted some abdominal girth increase, as well as 6 pound weight gain over the last 5 days with orthopnea, dyspnea on exertion despite adjusting torsemide dosage -Patient admits to dietary indiscretion including excessive fluid and salt intake the previous week -Chest x-ray is consistent with CHF -Troponin is 17, repeat troponin 12, EKG with sinus tach and LVH -BNP Up to 2225, BNP was 261 on 09/13/2020 and 388 on 09/12/2020 -CBC with hemoglobin of 11.1 MCV and MCH are low -Creatinine is 0.9 with a BUN of 16, sodium and potassium WNL -She had significant dyspnea at rest and with minimal activity EDP gave 80 mg of IV Lasix patient voided 1.5 L in the ED   Review of systems:    In addition to the HPI above,   A full Review of  Systems was done, all other systems reviewed are negative except as noted above in HPI , .     Social History:  Reviewed by me    Social History   Tobacco Use  . Smoking status: Never Smoker  . Smokeless tobacco: Never Used  Substance Use Topics  . Alcohol use: Yes    Comment: occ       Family History :  Reviewed by me    Family History  Problem Relation Age of Onset  . Diabetes Mother 69  . Heart disease Mother   . Hyperlipidemia Paternal Grandfather   . Hypertension Paternal Grandfather   . Heart disease  Father   . Hypertension Father   . Heart disease Maternal Grandmother 60  . ADD / ADHD Son   . Hypertension Maternal Uncle   . Heart attack Brother   . Sudden death Neg Hx   . Colon cancer Neg Hx   . Celiac disease Neg Hx   . Inflammatory bowel disease Neg Hx      Home Medications:   Prior to Admission medications   Medication Sig Start Date End Date Taking? Authorizing Provider  ACCU-CHEK GUIDE test strip USE AS DIRECTED TO TESTCBLOOD SUGAR TWICE DAILY. 06/11/20   [provider]  albuterol (VENTOLIN HFA) 108 (90 Base) MCG/ACT inhaler Inhale 1 puff into the lungs every 6 (six) hours as needed for wheezing or shortness of breath. 03/30/20   Kathie Dike, MD  amLODipine (NORVASC) 10 MG tablet Take 1 tablet (10 mg total) by mouth daily. 09/04/19 03/28/20  Strader, Fransisco Hertz, PA-C  ARIPiprazole (ABILIFY) 10 MG tablet Take 1 tablet (10 mg total) by mouth daily. 10/30/18   Barton Dubois, MD  aspirin EC 81 MG tablet Take 81 mg by mouth daily.    [provider]  budesonide-formoterol (SYMBICORT) 80-4.5 MCG/ACT inhaler Inhale 2 puffs into the lungs 2 (two) times daily. 03/30/20   Kathie Dike, MD  carvedilol (COREG) 25 MG tablet Take 2 tablets (50 mg total) by mouth 2 (two) times daily with a meal. 01/23/20   Branch, Alphonse Guild, MD  clonazePAM (KLONOPIN) 0.5 MG tablet Take 0.5 mg by mouth 2 (two) times daily. 12/19/18   [provider]  gabapentin (NEURONTIN) 800 MG tablet Take 800 mg by mouth 3 (three) times daily. 09/01/20   [provider]  hydrALAZINE (APRESOLINE) 100 MG tablet Take 1 tablet (100 mg total) by mouth 3 (three) times daily. 01/23/20   Arnoldo Lenis, MD  insulin glargine (LANTUS) 100 UNIT/ML injection Inject 30 Units at bedtime into the skin.     [provider]  insulin lispro (HUMALOG) 100 UNIT/ML injection Inject 10-16 Units into the skin 3 (three) times daily before meals.    [provider]  Ipratropium-Albuterol (COMBIVENT RESPIMAT) 20-100 MCG/ACT AERS respimat Inhale 1 puff into the lungs every 6 (six) hours as needed for wheezing or shortness of breath. 05/04/18   Manuella Ghazi, Pratik D, DO  linaclotide Rolan Lipa) 145 MCG CAPS capsule Take 1 capsule (145 mcg total) by mouth daily before breakfast. 03/14/19   Erenest Rasher, PA-C  liraglutide (VICTOZA) 18 MG/3ML SOPN Inject 1.2 mg into the skin daily. 09/04/20   Brita Romp, NP  nitroGLYCERIN (NITROSTAT) 0.4 MG SL tablet Place 1 tablet (0.4 mg total) under the tongue every 5 (five) minutes as needed for chest pain. 09/05/15   Kathie Dike, MD  ondansetron (ZOFRAN ODT) 8 MG disintegrating tablet Take 1 tablet (8 mg total) by mouth every 8 (eight) hours as needed for nausea or vomiting. 09/06/19   Long, Wonda Olds, MD  potassium chloride SA (KLOR-CON M20) 20 MEQ tablet Take 0.5 tablets (10 mEq total) by mouth daily. 09/13/20 12/12/20  Johnson, Clanford L, MD  pyridostigmine (MESTINON) 60 MG tablet Take 60 mg by mouth every 8 (eight) hours.    [provider]  sacubitril-valsartan (ENTRESTO) 97-103 MG Take 1 tablet by mouth 2 (two) times daily. 03/16/19   Arnoldo Lenis, MD  spironolactone (ALDACTONE) 25 MG tablet Take 0.5 tablets (12.5 mg total) by mouth daily. 09/14/20   Johnson, Clanford L, MD  torsemide (DEMADEX) 20 MG tablet  Take 2 tablets (40 mg total) by mouth 2 (two) times daily. 09/05/20   Verta Ellen., NP  traZODone (DESYREL) 100 MG tablet Take 2 tablets (200 mg total) by mouth at bedtime. 10/29/18   Barton Dubois, MD     Allergies:     Allergies  Allergen Reactions  . Diclofenac Swelling    AND POSSIBLE SYNCOPE; tolerates ibuprofen per pt  . Tramadol Nausea And Vomiting and Nausea Only    Itching (12/21); tolerates ibuprofen per pt  . Vicodin [Hydrocodone-Acetaminophen] Itching and Nausea Only     Physical Exam:   Vitals  Blood pressure (!) 148/116, pulse 94, temperature 98.1 F (36.7 C), temperature source Oral, resp. rate (!) 27, height 5\' 6"  (1.676 m), weight 99.8 kg, last menstrual period 08/21/2019, SpO2 100 %.  Physical Examination: General appearance - alert, obese appearing, with conversational dyspnea  mental status - alert, oriented to person, place, and time,  Eyes - sclera anicteric Neck - supple, no JVD elevation , Chest -diminished breath sounds with faint bibasilar rales  heart - S1 and S2 normal, regular  Abdomen - soft, nontender, nondistended, increased truncal adiposity Neurological - screening mental status exam normal, neck supple without rigidity, cranial nerves II through XII intact, DTR's normal and symmetric Extremities - trace pedal edema noted, intact peripheral pulses  Skin - warm, dry     Data Review:    CBC Recent Labs  Lab 10/07/20 1047  WBC 9.4  HGB 11.1*  HCT 36.2  PLT 254  MCV 74.5*  MCH 22.8*  MCHC 30.7  RDW 15.7*   ------------------------------------------------------------------------------------------------------------------  Chemistries  Recent Labs  Lab 10/07/20 1047  NA 137  K 3.6  CL 105  CO2 23  GLUCOSE 127*  BUN 16  CREATININE 0.96  CALCIUM 8.9  AST 17  ALT 14  ALKPHOS 72  BILITOT 1.0   ------------------------------------------------------------------------------------------------------------------ estimated creatinine clearance is 88.2 mL/min (by C-G formula based on SCr of 0.96  mg/dL). ------------------------------------------------------------------------------------------------------------------ No results for input(s): TSH, T4TOTAL, T3FREE, THYROIDAB in the last 72 hours.  Invalid input(s): FREET3   Coagulation profile No results for input(s): INR, PROTIME in the last 168 hours. ------------------------------------------------------------------------------------------------------------------- No results for input(s): DDIMER in the last 72 hours. -------------------------------------------------------------------------------------------------------------------  Cardiac Enzymes No results for input(s): CKMB, TROPONINI, MYOGLOBIN in the last 168 hours.  Invalid input(s): CK ------------------------------------------------------------------------------------------------------------------    Component Value Date/Time   BNP 2,225.0 (H) 10/07/2020 1047    Urinalysis    Component Value Date/Time   COLORURINE YELLOW 08/28/2019 1226   APPEARANCEUR CLEAR 08/28/2019 1226   LABSPEC 1.028 08/28/2019 1226   PHURINE 6.0 08/28/2019 1226   GLUCOSEU >=500 (A) 08/28/2019 1226   HGBUR NEGATIVE 08/28/2019 1226   BILIRUBINUR NEGATIVE 08/28/2019 1226   KETONESUR NEGATIVE 08/28/2019 1226   PROTEINUR Negative 12/19/2019 1010   PROTEINUR NEGATIVE 08/28/2019 1226   UROBILINOGEN 0.2 04/29/2014 1115   NITRITE n 12/19/2019 1010   NITRITE NEGATIVE 08/28/2019 1226   LEUKOCYTESUR Negative 12/19/2019 1010   LEUKOCYTESUR TRACE (A) 08/28/2019 1226    ----------------------------------------------------------------------------------------------------------------   Imaging Results:    DG Chest 2 View  Result Date: 10/07/2020 CLINICAL DATA:  Chest pain EXAM: CHEST - 2 VIEW COMPARISON:  09/10/2020 FINDINGS: Cardiomegaly. Vascular pedicle widening. Diffuse interstitial opacity with Kerley lines. No effusion or consolidation. Cholecystectomy clips. IMPRESSION: CHF. Electronically  Signed   By: Monte Fantasia M.D.   On: 10/07/2020 11:06    Radiological Exams on Admission: DG Chest 2 View  Result  Date: 10/07/2020 CLINICAL DATA:  Chest pain EXAM: CHEST - 2 VIEW COMPARISON:  09/10/2020 FINDINGS: Cardiomegaly. Vascular pedicle widening. Diffuse interstitial opacity with Kerley lines. No effusion or consolidation. Cholecystectomy clips. IMPRESSION: CHF. Electronically Signed   By: Monte Fantasia M.D.   On: 10/07/2020 11:06    DVT Prophylaxis -SCD /heparin AM Labs Ordered, also please review Full Orders  Family Communication: Admission, patients condition and plan of care including tests being ordered have been discussed with the patient  who indicate understanding and agree with the plan   Code Status - Full Code  Likely DC to  Home after diuresis  Condition   Stable   Roxan Hockey M.D on 10/07/2020 at 5:31 PM Go to [email or web address] -  for contact info  [facility] - Office  [phone]

## 2020-10-07 NOTE — ED Provider Notes (Signed)
[facility] Provider Note   CSN: [id number] Arrival date & time: 10/07/20  1000     History Chief Complaint  Patient presents with  . Chest Pain    Chelsey Santiago is a 45 y.o. female with a history of combined CHF (ECHO 08/2020 EF 35%), diabetes, hypertension.  Presents with a chief complaint of chest pain.  Patient reports that her chest pain started 2 days ago.  Chest pain is midsternal and does not radiate.  Pain is described as a pressure and rated 8/10 on the pain scale.  Pain has been constant since it started.  Patient denies any alleviating or aggravating factors.  Patient endorses associated shortness of breath.  She denies any associated diaphoresis, nausea, or vomiting.  Patient received 324 mg of aspirin with EMS on short hospital.  Patient reports that she took 1 nitroglycerin prior to EMS arrival with no improvement in her pain.  Patient also reports that she is caring more fluid in her stomach.  Patient reports he has gained approximately 3 to 4 pounds.  Patient has been taking all of her prescribed medications as instructed.  Patient endorses nonproductive cough over the last 2 days.  Patient endorses diarrhea.  Patient denies any fevers, chills, palpitations, leg swelling, abdominal pain, blood in stool, melena, syncope, near syncope, back pain, neck pain.  HPI     Past Medical History:  Diagnosis Date  . Anemia    H&H of 10.6/33 and 07/2008 and 11.9/35 and 09/2010  . Anxiety   . Chronic combined systolic and diastolic CHF (congestive heart failure) (HCC)    a. EF 40-45% by echo in 12/2015 with cath showing normal cors b. EF at 45% by repeat echo in 10/2018 c. EF at 30-35% in 03/2020  . Depression with anxiety   . Diabetes mellitus without complication ([place])   . Hypertension   . Hypertensive heart disease 2009   Pulmonary edema postpartum; mild to moderate mitral regurgitation when hospitalized for CHF in 2009; Echocardiogram in 12/2009-no MR  and normal EF; normal CXR in 09/2010  . Migraine headache   . Miscarriage 03/19/2013  . Obesity 04/16/2009  . Osteoarthritis, knee 03/29/2011  . Preeclampsia   . Pulmonary edema   . Sleep apnea   . Threatened abortion in early pregnancy 03/15/2013    Patient Active Problem List   Diagnosis Date Noted  . Pneumonia due to COVID-19 virus 06/09/2020  . Acute on chronic respiratory failure with hypoxia ([place]) 06/09/2020  . Obstructive sleep apnea 03/29/2020  . Flash pulmonary edema ([place]) 03/27/2020  . Back pain 12/19/2019  . Vaginal discharge 12/19/2019  . Vaginal itching 12/19/2019  . BV (bacterial vaginosis) 12/19/2019  . Chronic hypertension 12/19/2019  . Fibroids 09/26/2019  . S/P hysterectomy 09/26/2019  . Acute blood loss anemia 09/26/2019  . Abdominal pain, epigastric 03/14/2019  . Dysphagia 03/14/2019  . Gastroesophageal reflux disease 03/14/2019  . Class 2 obesity   . Acute exacerbation of CHF (congestive heart failure) ([place]) 10/26/2018  . Chronic combined systolic and diastolic CHF (congestive heart failure) ([place]) 06/02/2018  . Uncontrolled type 2 diabetes mellitus with hyperglycemia ([place]) 06/02/2018  . Influenza B 05/13/2018  . Hypomagnesemia 05/12/2018  . Headache 05/12/2018  . Upper respiratory tract infection   . HCAP (healthcare-associated pneumonia)   . Constipation 10/17/2017  . Bad headache   . CHF exacerbation ([place]) 09/29/2017  . Iron deficiency anemia 05/16/2017  . Vitamin D deficiency 11/25/2016  . Iron deficiency 11/25/2016  . History  of acute myocardial infarction 10/05/2016  . CHF (congestive heart failure) ([place]) 05/06/2016  . Diabetes mellitus with complication ([place]) 03/02/5101  . Leukocytosis 05/06/2016  . Neuropathy 05/06/2016  . Depression 04/16/2016  . Chronic tension-type headache, not intractable 04/16/2016  . AKI (acute kidney injury) ([place])   . Hyperkalemia   . Nonischemic cardiomyopathy ([place])   . Acute on chronic combined systolic and  diastolic ACC/AHA stage C congestive heart failure ([place]) 04/03/2016  . Acute on chronic combined systolic and diastolic CHF, NYHA class 4 ([place]) 04/03/2016  . Hypertensive emergency 04/03/2016  . Cardiomyopathy due to hypertension ([place]) 12/22/2015  . Normal coronary arteries 12/22/2015  . Troponin level elevated 12/22/2015  . NSTEMI (non-ST elevated myocardial infarction) ([place]) 12/20/2015  . Dental infection 10/10/2015  . Chest pain 09/05/2015  . Systolic CHF, chronic ([place]) 09/05/2015  . LLQ pain   . Type 2 diabetes mellitus without complication ([place]) 58/52/7782  . Essential hypertension   . 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 Hypertensive urgency 04/22/2014  . Acute CHF ([place]) 04/22/2014  . S/P cesarean section 04/11/2014  . Acute pulmonary edema ([place]) 04/11/2014  . Postoperative anemia 04/11/2014  . Elevated serum creatinine 04/11/2014  . Preeclampsia, severe 04/09/2014  . Pre-eclampsia superimposed on chronic hypertension, antepartum 04/08/2014  . Dyspnea 04/08/2014  . Polyhydramnios in third trimester, antepartum 03/14/2014  . Abnormal maternal glucose tolerance, antepartum 03/11/2014  . High-risk pregnancy 03/11/2014  . Pre-existing essential hypertension complicating pregnancy 42/35/3614  . Impaired glucose tolerance during pregnancy, antepartum 11/27/2013  . Leiomyoma of uterus 11/22/2013  . History of gestational diabetes in prior pregnancy, currently pregnant in first trimester 11/22/2013  . Hx of preeclampsia, prior pregnancy, currently pregnant 11/22/2013  . Short interval between pregnancies affecting pregnancy, antepartum 11/22/2013  . Supervision of high-risk pregnancy of elderly primigravida (>= 79 years old at delivery), third trimester 11/22/2013  . Miscarriage 03/19/2013  . Major depressive disorder, single episode, unspecified 09/27/2011  . Hypertension   . Hypertensive cardiovascular disease   . Microcytic anemia   . Osteoarthrosis involving lower leg  03/29/2011  . Hypokalemia 12/12/2009  . OSA on CPAP 12/09/2009  . Morbid obesity ([place]) 04/16/2009    Past Surgical History:  Procedure Laterality Date  . BREAST REDUCTION SURGERY  2002  . CARDIAC CATHETERIZATION N/A 12/22/2015   Procedure: Left Heart Cath and Coronary Angiography;  Surgeon: Hebah Bogosian M Martinique, MD;  Location: [facility];  Service: Cardiovascular;  Laterality: N/A;  . CESAREAN SECTION N/A 04/09/2014   Procedure: CESAREAN SECTION;  Surgeon: Mora Bellman, MD;  Location: [facility];  Service: Obstetrics;  Laterality: N/A;  . CHOLECYSTECTOMY    . HYSTERECTOMY ABDOMINAL WITH SALPINGECTOMY N/A 09/26/2019   Procedure: HYSTERECTOMY ABDOMINAL WITH SALPINGECTOMY;  Surgeon: Florian Buff, MD;  Location: [facility];  Service: Gynecology;  Laterality: N/A;  . LIPOMA EXCISION Right 09/26/2019   Procedure: EXCISION LIPOMA RIGHT VULVAR;  Surgeon: Florian Buff, MD;  Location: [facility];  Service: Gynecology;  Laterality: Right;     OB History    Gravida  11   Para  6   Term  5   Preterm  1   AB  5   Living  6     SAB  3   IAB  2   Ectopic      Multiple  0   Live Births  6           Family History  Problem Relation Age of Onset  . Diabetes Mother 37  .  Heart disease Mother   . Hyperlipidemia Paternal Grandfather   . Hypertension Paternal Grandfather   . Heart disease Father   . Hypertension Father   . Heart disease Maternal Grandmother 60  . ADD / ADHD Son   . Hypertension Maternal Uncle   . Heart attack Brother   . Sudden death Neg Hx   . Colon cancer Neg Hx   . Celiac disease Neg Hx   . Inflammatory bowel disease Neg Hx     Social History   Tobacco Use  . Smoking status: Never Smoker  . Smokeless tobacco: Never Used  Vaping Use  . Vaping Use: Never used  Substance Use Topics  . Alcohol use: Yes    Comment: occ  . Drug use: No    Home Medications Prior to Admission medications   Medication Sig Start Date End Date Taking? Authorizing Provider   ACCU-CHEK GUIDE test strip USE AS DIRECTED TO TESTCBLOOD SUGAR TWICE DAILY. 06/11/20   [provider]  albuterol (VENTOLIN HFA) 108 (90 Base) MCG/ACT inhaler Inhale 1 puff into the lungs every 6 (six) hours as needed for wheezing or shortness of breath. 03/30/20   Kathie Dike, MD  amLODipine (NORVASC) 10 MG tablet Take 1 tablet (10 mg total) by mouth daily. 09/04/19 03/28/20  Strader, Fransisco Hertz, PA-C  ARIPiprazole (ABILIFY) 10 MG tablet Take 1 tablet (10 mg total) by mouth daily. 10/30/18   Barton Dubois, MD  aspirin EC 81 MG tablet Take 81 mg by mouth daily.    [provider]  budesonide-formoterol (SYMBICORT) 80-4.5 MCG/ACT inhaler Inhale 2 puffs into the lungs 2 (two) times daily. 03/30/20   Kathie Dike, MD  carvedilol (COREG) 25 MG tablet Take 2 tablets (50 mg total) by mouth 2 (two) times daily with a meal. 01/23/20   Branch, Alphonse Guild, MD  clonazePAM (KLONOPIN) 0.5 MG tablet Take 0.5 mg by mouth 2 (two) times daily. 12/19/18   [provider]  gabapentin (NEURONTIN) 800 MG tablet Take 800 mg by mouth 3 (three) times daily. 09/01/20   [provider]  hydrALAZINE (APRESOLINE) 100 MG tablet Take 1 tablet (100 mg total) by mouth 3 (three) times daily. 01/23/20   Arnoldo Lenis, MD  insulin glargine (LANTUS) 100 UNIT/ML injection Inject 30 Units at bedtime into the skin.     [provider]  insulin lispro (HUMALOG) 100 UNIT/ML injection Inject 10-16 Units into the skin 3 (three) times daily before meals.    [provider]  Ipratropium-Albuterol (COMBIVENT RESPIMAT) 20-100 MCG/ACT AERS respimat Inhale 1 puff into the lungs every 6 (six) hours as needed for wheezing or shortness of breath. 05/04/18   Manuella Ghazi, Pratik D, DO  linaclotide Rolan Lipa) 145 MCG CAPS capsule Take 1 capsule (145 mcg total) by mouth daily before breakfast. 03/14/19   Erenest Rasher, PA-C  liraglutide (VICTOZA) 18 MG/3ML SOPN Inject 1.2 mg into the skin daily. 09/04/20    Brita Romp, NP  nitroGLYCERIN (NITROSTAT) 0.4 MG SL tablet Place 1 tablet (0.4 mg total) under the tongue every 5 (five) minutes as needed for chest pain. 09/05/15   Kathie Dike, MD  ondansetron (ZOFRAN ODT) 8 MG disintegrating tablet Take 1 tablet (8 mg total) by mouth every 8 (eight) hours as needed for nausea or vomiting. 09/06/19   Long, Wonda Olds, MD  potassium chloride SA (KLOR-CON M20) 20 MEQ tablet Take 0.5 tablets (10 mEq total) by mouth daily. 09/13/20 12/12/20  Murlean Iba, MD  pyridostigmine (MESTINON)  60 MG tablet Take 60 mg by mouth every 8 (eight) hours.    [provider]  sacubitril-valsartan (ENTRESTO) 97-103 MG Take 1 tablet by mouth 2 (two) times daily. 03/16/19   Arnoldo Lenis, MD  spironolactone (ALDACTONE) 25 MG tablet Take 0.5 tablets (12.5 mg total) by mouth daily. 09/14/20   Johnson, Clanford L, MD  torsemide (DEMADEX) 20 MG tablet Take 2 tablets (40 mg total) by mouth 2 (two) times daily. 09/05/20   Verta Ellen., NP  traZODone (DESYREL) 100 MG tablet Take 2 tablets (200 mg total) by mouth at bedtime. 10/29/18   Barton Dubois, MD    Allergies    Diclofenac, Tramadol, and Vicodin [hydrocodone-acetaminophen]  Review of Systems   Review of Systems  Constitutional: Negative for chills and fever.  Eyes: Negative for visual disturbance.  Respiratory: Positive for shortness of breath.   Cardiovascular: Positive for chest pain. Negative for palpitations and leg swelling.  Gastrointestinal: Positive for abdominal distention and diarrhea. Negative for abdominal pain, anal bleeding, blood in stool, constipation and nausea.  Genitourinary: Positive for frequency. Negative for difficulty urinating, dysuria and hematuria.  Musculoskeletal: Negative for back pain and neck pain.  Skin: Negative for color change and rash.  Neurological: Negative for dizziness, syncope, light-headedness and headaches.  Psychiatric/Behavioral: Negative for confusion.     Physical Exam Updated Vital Signs BP (!) 218/150   Pulse (!) 107   Temp 98.2 F (36.8 C) (Oral)   Resp 16   Ht 5\' 6"  (1.676 m)   Wt 99.8 kg   LMP 08/21/2019   SpO2 94%   BMI 35.51 kg/m   Physical Exam Vitals and nursing note reviewed.  Constitutional:      General: She is not in acute distress.    Appearance: She is not ill-appearing, toxic-appearing or diaphoretic.  HENT:     Head: Normocephalic.  Eyes:     General: No scleral icterus.       Right eye: No discharge.        Left eye: No discharge.  Neck:     Vascular: No JVD.  Cardiovascular:     Rate and Rhythm: Tachycardia present.     Pulses:          Radial pulses are 3+ on the right side and 3+ on the left side.     Comments: Tachycardic at rate of 107 Pulmonary:     Effort: Pulmonary effort is normal. No tachypnea, bradypnea or respiratory distress.     Breath sounds: Normal breath sounds.     Comments: Patient able to speak in full complete sentences without difficulty Chest:     Chest wall: No tenderness.  Abdominal:     General: Abdomen is protuberant. Bowel sounds are normal. There is no distension. There are no signs of injury.     Palpations: Abdomen is soft. There is fluid wave. There is no mass or pulsatile mass.     Tenderness: There is no abdominal tenderness. There is no guarding or rebound.     Hernia: There is no hernia in the umbilical area or ventral area.  Musculoskeletal:     Cervical back: Neck supple.     Right lower leg: No tenderness. No edema.     Left lower leg: No tenderness. No edema.  Skin:    General: Skin is warm and dry.     Coloration: Skin is not cyanotic or pale.  Neurological:     General: No focal deficit present.  Mental Status: She is alert.     GCS: GCS eye subscore is 4. GCS verbal subscore is 5. GCS motor subscore is 6.  Psychiatric:        Behavior: Behavior is cooperative.     ED Results / Procedures / Treatments   Labs (all labs ordered are listed,  but only abnormal results are displayed) Labs Reviewed  BASIC METABOLIC PANEL - Abnormal; Notable for the following components:      Result Value   Glucose, Bld 127 (*)    All other components within normal limits  CBC - Abnormal; Notable for the following components:   Hemoglobin 11.1 (*)    MCV 74.5 (*)    MCH 22.8 (*)    RDW 15.7 (*)    All other components within normal limits  BRAIN NATRIURETIC PEPTIDE - Abnormal; Notable for the following components:   B Natriuretic Peptide 2,225.0 (*)    All other components within normal limits  HEPATIC FUNCTION PANEL - Abnormal; Notable for the following components:   Albumin 3.3 (*)    All other components within normal limits  SARS CORONAVIRUS 2 (TAT 6-24 HRS)  TROPONIN I (HIGH SENSITIVITY)  TROPONIN I (HIGH SENSITIVITY)    EKG EKG Interpretation  Date/Time:  Tuesday Oct 07 2020 10:12:42 EDT Ventricular Rate:  107 PR Interval:  167 QRS Duration: 106 QT Interval:  364 QTC Calculation: 486 R Axis:   129 Text Interpretation: Sinus tachycardia Right axis deviation Consider left ventricular hypertrophy Borderline prolonged QT interval Confirmed by Fredia Sorrow [phone]) on 10/07/2020 10:59:14 AM   Radiology DG Chest 2 View  Result Date: 10/07/2020 CLINICAL DATA:  Chest pain EXAM: CHEST - 2 VIEW COMPARISON:  09/10/2020 FINDINGS: Cardiomegaly. Vascular pedicle widening. Diffuse interstitial opacity with Kerley lines. No effusion or consolidation. Cholecystectomy clips. IMPRESSION: CHF. Electronically Signed   By: Monte Fantasia M.D.   On: 10/07/2020 11:06    Procedures .Critical Care Performed by: Loni Beckwith, PA-C Authorized by: Loni Beckwith, PA-C       Medications Ordered in ED Medications  furosemide (LASIX) injection 80 mg (has no administration in time range)  nitroGLYCERIN (NITROSTAT) SL tablet 0.4 mg (0.4 mg Sublingual Given 10/07/20 1209)    ED Course  I have reviewed the triage vital signs and  the nursing notes.  Pertinent labs & imaging results that were available during my care of the patient were reviewed by me and considered in my medical decision making (see chart for details).    MDM Rules/Calculators/A&P                          Alert 45 year old female no acute distress, nontoxic-appearing.  Patient presents with chief complaint of chest pain and shortness of breath.  Patient's symptoms have been present for 2 days.  Patient has a history of CHF reports that she has fluid collection on her abdomen.  Patient endorses 3 to 4 pound weight gain.  Patient states that she has been taking all of her medications as prescribed.  Patient took 40 mg Lasix earlier this morning.  Patient takes 80 mg Lasix daily.  Patient able to speak in full complete sentences without difficulty.  Lungs clear to auscultation bilaterally.  Patient has no JVD appreciated.  Patient has no edema or tenderness to bilateral lower extremities.  Abdomen soft, nondistended, nontender.  Patient is of positive fluid wave.  Patient received 324 mg of aspirin with EMS on arrival  to emergency department.  Patient endorses taking 1 nitroglycerin prior to EMS arrival.  No change in pain after nitroglycerin  EKG, chest x-ray, troponin, BMP and CBC obtained while patient was in triage.  We will add hepatic function panel as well as BNP  EKG shows sinus tachycardia. Chest x-ray shows cardiomegaly, vascular pedicle widening, diffuse interstitial opacity with curly lines.  No effusion or consolidation. Troponin 17. BMP unremarkable. Hepatic function unremarkable. CBC shows anemia with hemoglobin 11.1  BNP elevated at 2225.  Will give patient 80 mg Lasix for acute CHF and consult hospitalist for admission.  1250 spoke to hospitalist Dr.Emokpae who will see the patient for admission.  Final Clinical Impression(s) / ED Diagnoses Final diagnoses:  Acute combined systolic and diastolic congestive heart failure [facility])     Rx / DC Orders ED Discharge Orders    None       Dyann Ruddle 10/07/20 2241    Fredia Sorrow, MD 10/11/20 [phone]

## 2020-10-08 ENCOUNTER — Encounter (HOSPITAL_COMMUNITY): Payer: Self-pay | Admitting: Family Medicine

## 2020-10-08 ENCOUNTER — Inpatient Hospital Stay (HOSPITAL_COMMUNITY): Payer: Medicaid Other

## 2020-10-08 DIAGNOSIS — E1165 Type 2 diabetes mellitus with hyperglycemia: Secondary | ICD-10-CM

## 2020-10-08 DIAGNOSIS — I10 Essential (primary) hypertension: Secondary | ICD-10-CM

## 2020-10-08 DIAGNOSIS — I5043 Acute on chronic combined systolic (congestive) and diastolic (congestive) heart failure: Secondary | ICD-10-CM | POA: Diagnosis not present

## 2020-10-08 DIAGNOSIS — G4733 Obstructive sleep apnea (adult) (pediatric): Secondary | ICD-10-CM

## 2020-10-08 DIAGNOSIS — Z9989 Dependence on other enabling machines and devices: Secondary | ICD-10-CM

## 2020-10-08 LAB — VITAMIN B12: Vitamin B-12: 384 pg/mL (ref 180–914)

## 2020-10-08 LAB — BASIC METABOLIC PANEL
Anion gap: 11 (ref 5–15)
BUN: 22 mg/dL — ABNORMAL HIGH (ref 6–20)
CO2: 27 mmol/L (ref 22–32)
Calcium: 9 mg/dL (ref 8.9–10.3)
Chloride: 99 mmol/L (ref 98–111)
Creatinine, Ser: 1.49 mg/dL — ABNORMAL HIGH (ref 0.44–1.00)
GFR, Estimated: 44 mL/min — ABNORMAL LOW (ref >60)
Glucose, Bld: 182 mg/dL — ABNORMAL HIGH (ref 70–99)
Potassium: 3.4 mmol/L — ABNORMAL LOW (ref 3.5–5.1)
Sodium: 137 mmol/L (ref 135–145)

## 2020-10-08 LAB — IRON AND TIBC
Iron: 25 ug/dL — ABNORMAL LOW (ref 28–170)
Saturation Ratios: 7 % — ABNORMAL LOW (ref 10.4–31.8)
TIBC: 347 ug/dL (ref 250–450)
UIBC: 322 ug/dL

## 2020-10-08 LAB — GLUCOSE, CAPILLARY
Glucose-Capillary: 131 mg/dL — ABNORMAL HIGH (ref 70–99)
Glucose-Capillary: 171 mg/dL — ABNORMAL HIGH (ref 70–99)
Glucose-Capillary: 154 mg/dL — ABNORMAL HIGH (ref 70–99)
Glucose-Capillary: 211 mg/dL — ABNORMAL HIGH (ref 70–99)

## 2020-10-08 LAB — CBC
HCT: 39.8 % (ref 36.0–46.0)
Hemoglobin: 12.3 g/dL (ref 12.0–15.0)
MCH: 22.9 pg — ABNORMAL LOW (ref 26.0–34.0)
MCHC: 30.9 g/dL (ref 30.0–36.0)
MCV: 74.1 fL — ABNORMAL LOW (ref 80.0–100.0)
Platelets: 270 10*3/uL (ref 150–400)
RBC: 5.37 MIL/uL — ABNORMAL HIGH (ref 3.87–5.11)
RDW: 15.9 % — ABNORMAL HIGH (ref 11.5–15.5)
WBC: 9.3 10*3/uL (ref 4.0–10.5)
nRBC: 0 % (ref 0.0–0.2)

## 2020-10-08 LAB — FERRITIN: Ferritin: 34 ng/mL (ref 11–307)

## 2020-10-08 LAB — FOLATE: Folate: 15.6 ng/mL (ref >5.9)

## 2020-10-08 MED ORDER — LIVING BETTER WITH HEART FAILURE BOOK: Freq: Once | Status: AC

## 2020-10-08 MED ORDER — LOPERAMIDE HCL 2 MG PO CAPS
4.0000 mg | ORAL_CAPSULE | ORAL | Status: DC | PRN
  Administered 2020-10-08 – 2020-10-12 (×4): 4 mg via ORAL
  Filled 2020-10-08 (×4): qty 2

## 2020-10-08 MED ORDER — POTASSIUM CHLORIDE CRYS ER 20 MEQ PO TBCR
40.0000 meq | EXTENDED_RELEASE_TABLET | Freq: Every day | ORAL | Status: DC
  Administered 2020-10-08 – 2020-10-09 (×2): 40 meq via ORAL
  Filled 2020-10-08 (×2): qty 2

## 2020-10-08 MED ORDER — FUROSEMIDE 10 MG/ML IJ SOLN: 40.0000 mg | Freq: Two times a day (BID) | INTRAMUSCULAR | Status: DC

## 2020-10-08 MED ORDER — SACUBITRIL-VALSARTAN 97-103 MG PO TABS
1.0000 | ORAL_TABLET | Freq: Two times a day (BID) | ORAL | Status: DC
  Administered 2020-10-08 – 2020-10-13 (×11): 1 via ORAL
  Filled 2020-10-08 (×15): qty 1

## 2020-10-08 MED ORDER — FUROSEMIDE 10 MG/ML IJ SOLN
40.0000 mg | Freq: Two times a day (BID) | INTRAMUSCULAR | Status: DC
  Administered 2020-10-08 – 2020-10-10 (×5): 40 mg via INTRAVENOUS
  Filled 2020-10-08 (×5): qty 4

## 2020-10-08 NOTE — Progress Notes (Signed)
PROGRESS NOTE   Chelsey Santiago  HUT:[id number] DOB: 11/13/1975 DOA: 10/07/2020 PCP: Rosita Fire, MD   Chief Complaint  Patient presents with  . Chest Pain   Level of care: Telemetry  Brief Admission History:  45 y.o. female with past medical history relevant for obesity/OSA, as well as HTN, DM2, chronic combined systolic and diastolic CHF/NICM(EF 65-68% by echo in 12/2015 with cath showing normal cors, EF at 45% by repeat echo in 10/2018, at 35% in 09/11/2020- presents to the ED with complaints of dyspnea and chest discomfort -No fever  Or chills , no productive cough no pleuritic symptoms -Patient noted some abdominal girth increase, as well as 6 pound weight gain over the last 5 days with orthopnea, dyspnea on exertion despite adjusting torsemide dosage -Patient admits to dietary indiscretion including excessive fluid and salt intake the previous week -Chest x-ray is consistent with CHF -Troponin is 17, repeat troponin 12, EKG with sinus tach and LVH -BNP Up to 2225, BNP was 261 on 09/13/2020 and 388 on 09/12/2020 -CBC with hemoglobin of 11.1 MCV and MCH are low -Creatinine is 0.9 with a BUN of 16, sodium and potassium WNL -She had significant dyspnea at rest and with minimal activity EDP gave 80 mg of IV Lasix patient voided 1.5 L in the ED  Assessment & Plan:   Principal Problem:   Acute on chronic combined systolic and diastolic ACC/AHA stage C congestive heart failure (HCC) Active Problems:   OSA on CPAP   Hypertension   Uncontrolled type 2 diabetes mellitus with hyperglycemia (HCC)   Class 2 obesity   Chronic anemia   Acute combined systolic diastolic heart failure - due to p oor compliance with diet and medication, multiple hospitalizations for same, diuresing well on IV lasix.  Continue for now, hopefully can transition to oral diuretic starting 5/16.  CXR this AM with marked improvement in CHF findings.    DM type 2 with neurological complications, uncontrolled - as  evidenced by an A1c of 9.8% - continue current mgmt and follow CBG closely.   Essential hypertension - BPs better controlled, following.   DVT prophylaxis: heparin  Code Status: full  Family Communication: discussed with pt at bedside  Disposition: anticipate return home  Status is: Inpatient  Remains inpatient appropriate because:IV treatments appropriate due to intensity of illness or inability to take PO and Inpatient level of care appropriate due to severity of illness   Dispo: The patient is from: Home              Anticipated d/c is to: Home              Patient currently is not medically stable to d/c.   Difficult to place patient No  Consultants:   n/a  Procedures:   n/a  Antimicrobials:  n/a   Subjective: Pt reports less SOB and reports urinating frequently.    Objective: Vitals:   10/08/20 0408 10/08/20 0500 10/08/20 0837 10/08/20 1427  BP: 131/81   104/66  Pulse: 90   92  Resp: 20   20  Temp: 97.8 F (36.6 C)   98.2 F (36.8 C)  TempSrc:    Oral  SpO2: 96%  96% 98%  Weight:  97.8 kg    Height:        Intake/Output Summary (Last 24 hours) at 10/08/2020 1628 Last data filed at 10/08/2020 1500 Gross per 24 hour  Intake 1200 ml  Output 911 ml  Net 289 ml   Filed  Weights   10/07/20 1009 10/08/20 0500  Weight: 99.8 kg 97.8 kg    Examination:  General exam: MO female, awake, alert, lying in bed, NAD, Appears calm and comfortable  Respiratory system: Clear to auscultation. Respiratory effort normal. Cardiovascular system: normal S1 & S2 heard. No JVD, murmurs, rubs, gallops or clicks. No pedal edema. Gastrointestinal system: Abdomen is nondistended, soft and nontender. No organomegaly or masses felt. Normal bowel sounds heard. Central nervous system: Alert and oriented. No focal neurological deficits. Extremities: 1+ pitting edema BLEs.  Symmetric 5 x 5 power. Skin: No rashes, lesions or ulcers Psychiatry: Judgement and insight appear normal. Mood  & affect appropriate.   Data Reviewed: I have personally reviewed following labs and imaging studies  CBC: Recent Labs  Lab 10/07/20 1047 10/08/20 0551  WBC 9.4 9.3  HGB 11.1* 12.3  HCT 36.2 39.8  MCV 74.5* 74.1*  PLT 254 270    Basic Metabolic Panel: Recent Labs  Lab 10/07/20 1047 10/08/20 0551  NA 137 137  K 3.6 3.4*  CL 105 99  CO2 23 27  GLUCOSE 127* 182*  BUN 16 22*  CREATININE 0.96 1.49*  CALCIUM 8.9 9.0    GFR: Estimated Creatinine Clearance: 56.2 mL/min (A) (by C-G formula based on SCr of 1.49 mg/dL (H)).  Liver Function Tests: Recent Labs  Lab 10/07/20 1047  AST 17  ALT 14  ALKPHOS 72  BILITOT 1.0  PROT 7.4  ALBUMIN 3.3*    CBG: Recent Labs  Lab 10/07/20 1647 10/07/20 2148 10/08/20 0810 10/08/20 1125 10/08/20 1616  GLUCAP 129* 152* 131* 171* 154*    Recent Results (from the past 240 hour(s))  SARS CORONAVIRUS 2 (TAT 6-24 HRS) Nasopharyngeal Nasopharyngeal Swab     Status: None   Collection Time: 10/07/20 12:03 PM   Specimen: Nasopharyngeal Swab  Result Value Ref Range Status   SARS Coronavirus 2 NEGATIVE NEGATIVE Final    Comment: (NOTE) SARS-CoV-2 target nucleic acids are NOT DETECTED.  The SARS-CoV-2 RNA is generally detectable in upper and lower respiratory specimens during the acute phase of infection. Negative results do not preclude SARS-CoV-2 infection, do not rule out co-infections with other pathogens, and should not be used as the sole basis for treatment or other patient management decisions. Negative results must be combined with clinical observations, patient history, and epidemiological information. The expected result is Negative.  Fact Sheet for Patients: SugarRoll.be  Fact Sheet for Healthcare Providers: https://www.woods-mathews.com/  This test is not yet approved or cleared by the [place] FDA and  has been authorized for detection and/or diagnosis of SARS-CoV-2  by FDA under an Emergency Use Authorization (EUA). This EUA will remain  in effect (meaning this test can be used) for the duration of the COVID-19 declaration under Se ction 564(b)(1) of the Act, 21 U.S.C. section 360bbb-3(b)(1), unless the authorization is terminated or revoked sooner.  Performed at [facility], [place] [address] [place], [place] [address]      Radiology Studies: DG Chest 2 View  Result Date: 10/08/2020 CLINICAL DATA:  Shortness of breath. EXAM: CHEST - 2 VIEW COMPARISON:  10/07/2020. FINDINGS: Cardiomegaly again noted. Interim near complete clearing of bilateral interstitial prominence suggesting clearing CHF. No pleural effusion or pneumothorax. IMPRESSION: Cardiomegaly again noted. Interim near complete clearing of bilateral interstitial prominence suggesting clearing CHF. Electronically Signed   By: Marcello Moores  Register   On: 10/08/2020 06:00   DG Chest 2 View  Result Date: 10/07/2020 CLINICAL DATA:  Chest pain EXAM:  CHEST - 2 VIEW COMPARISON:  09/10/2020 FINDINGS: Cardiomegaly. Vascular pedicle widening. Diffuse interstitial opacity with Kerley lines. No effusion or consolidation. Cholecystectomy clips. IMPRESSION: CHF. Electronically Signed   By: Monte Fantasia M.D.   On: 10/07/2020 11:06    Scheduled Meds: . ARIPiprazole  10 mg Oral Daily  . aspirin EC  81 mg Oral Daily  . carvedilol  50 mg Oral BID WC  . clonazePAM  0.5 mg Oral BID  . furosemide  40 mg Intravenous BID  . gabapentin  800 mg Oral TID  . heparin  5,000 Units Subcutaneous Q8H  . hydrALAZINE  100 mg Oral TID  . insulin glargine  30 Units Subcutaneous QHS  . isosorbide mononitrate  30 mg Oral Daily  . linaclotide  145 mcg Oral QAC breakfast  . Living Better with Heart Failure Book   Does not apply Once  . mometasone-formoterol  2 puff Inhalation BID  . potassium chloride SA  40 mEq Oral Daily  . pyridostigmine  60 mg Oral Q8H  . sacubitril-valsartan  1 tablet Oral BID  . sodium chloride  flush  3 mL Intravenous Q12H  . sodium chloride flush  3 mL Intravenous Q12H  . spironolactone  12.5 mg Oral Daily  . traZODone  200 mg Oral QHS   Continuous Infusions: . sodium chloride       LOS: 1 day   Time spent: 43 mins  Bryssa Tones Wynetta Emery, MD How to contact the [facility] Attending or Consulting provider [place] or covering provider during after hours [place], for this patient?  1. Check the care team in [facility] and look for a) attending/consulting TRH provider listed and b) the [facility] team listed 2. Log into [email or web address] and use [place]'s universal password to access. If you do not have the password, please contact the hospital operator. 3. Locate the [facility] provider you are looking for under [facility] and page to a number that you can be directly reached. 4. If you still have difficulty reaching the provider, please page the [facility] (Director on Call) for the Hospitalists listed on amion for assistance.  10/08/2020, 4:28 PM

## 2020-10-08 NOTE — TOC Initial Note (Signed)
Transition of Care [facility]) - Initial/Assessment Note    Patient Details  Name: Chelsey Santiago MRN: [id number] Date of Birth: 10-25-1975  Transition of [facility]) CM/SW Contact:    Boneta Lucks, RN Phone Number: 10/08/2020, 2:37 PM  Clinical Narrative:   Patient admitted for CHF. Pt is high risk for readmission.TOC spoke with pt over the phone to complete assessment. Pt states that it is her and her children in the home. Patient now has an aide 2 hours a day provided by [employer]. Pt drives.  Pt uses a cane and is supplied 2-3L O2 through [employer]. Pt is interested in getting a bedside commode, TOC ordered and called [employer], they will drop ship. RN will provided patient a Living better with  CHF book for education. Patient was given a digital scale last admission.              Expected Discharge Plan: Home/Self Care Barriers to Discharge: Continued Medical Work up  Patient Goals and CMS Choice Patient states their goals for this hospitalization and ongoing recovery are:: to go home, CMS Medicare.gov Compare Post Acute Care list provided to:: Patient Choice offered to / list presented to : Patient  Expected Discharge Plan and Services Expected Discharge Plan: Home/Self Care      Living arrangements for the past 2 months: Single Family Home                   Prior Living Arrangements/Services Living arrangements for the past 2 months: Single Family Home Lives with:: Minor Children          Activities of Daily Living Home Assistive Devices/Equipment: Cane (specify quad or straight),Oxygen ADL Screening (condition at time of admission) Patient's cognitive ability adequate to safely complete daily activities?: Yes Is the patient deaf or have difficulty hearing?: No Does the patient have difficulty seeing, even when wearing glasses/contacts?: No Does the patient have difficulty concentrating, remembering, or making decisions?: No Patient able to express need for assistance with  ADLs?: Yes Does the patient have difficulty dressing or bathing?: Yes Independently performs ADLs?: No Communication: Independent Dressing (OT): Needs assistance Is this a change from baseline?: Pre-admission baseline Grooming: Independent with device (comment) Feeding: Independent Bathing: Independent with device (comment) Toileting: Independent In/Out Bed: Independent Walks in Home: Independent Does the patient have difficulty walking or climbing stairs?: Yes Weakness of Legs: None Weakness of Arms/Hands: None  Permission Sought/Granted     Emotional Assessment     Affect (typically observed): Accepting Orientation: : Oriented to Self,Oriented to Place,Oriented to  Time,Oriented to Situation Alcohol / Substance Use: Not Applicable Psych Involvement: No (comment)  Admission diagnosis:  Acute exacerbation of CHF (congestive heart failure) (HCC) [I50.9] Dyspnea and respiratory abnormalities [R06.00, R06.89] Patient Active Problem List   Diagnosis Date Noted  . Chronic anemia 10/07/2020  . Pneumonia due to COVID-19 virus 06/09/2020  . Acute on chronic respiratory failure with hypoxia ([place]) 06/09/2020  . Obstructive sleep apnea 03/29/2020  . Flash pulmonary edema ([place]) 03/27/2020  . Back pain 12/19/2019  . Vaginal discharge 12/19/2019  . Vaginal itching 12/19/2019  . BV (bacterial vaginosis) 12/19/2019  . Chronic hypertension 12/19/2019  . Fibroids 09/26/2019  . S/P hysterectomy 09/26/2019  . Acute blood loss anemia 09/26/2019  . Abdominal pain, epigastric 03/14/2019  . Dysphagia 03/14/2019  . Gastroesophageal reflux disease 03/14/2019  . Class 2 obesity   . Acute exacerbation of CHF (congestive heart failure) ([place]) 10/26/2018  . Chronic combined systolic and diastolic CHF (  congestive heart failure) ([place]) 06/02/2018  . Uncontrolled type 2 diabetes mellitus with hyperglycemia ([place]) 06/02/2018  . Influenza B 05/13/2018  . Hypomagnesemia 05/12/2018  . Headache  05/12/2018  . Upper respiratory tract infection   . HCAP (healthcare-associated pneumonia)   . Constipation 10/17/2017  . Bad headache   . CHF exacerbation ([place]) 09/29/2017  . Iron deficiency anemia 05/16/2017  . Vitamin D deficiency 11/25/2016  . Iron deficiency 11/25/2016  . History of acute myocardial infarction 10/05/2016  . CHF (congestive heart failure) ([place]) 05/06/2016  . Diabetes mellitus with complication ([place]) 62/70/3500  . Leukocytosis 05/06/2016  . Neuropathy 05/06/2016  . Depression 04/16/2016  . Chronic tension-type headache, not intractable 04/16/2016  . AKI (acute kidney injury) ([place])   . Hyperkalemia   . Nonischemic cardiomyopathy ([place])   . Acute on chronic combined systolic and diastolic ACC/AHA stage C congestive heart failure ([place]) 04/03/2016  . Acute on chronic combined systolic and diastolic CHF, NYHA class 4 ([place]) 04/03/2016  . Hypertensive emergency 04/03/2016  . Cardiomyopathy due to hypertension ([place]) 12/22/2015  . Normal coronary arteries 12/22/2015  . Troponin level elevated 12/22/2015  . NSTEMI (non-ST elevated myocardial infarction) ([place]) 12/20/2015  . Dental infection 10/10/2015  . Chest pain 09/05/2015  . Systolic CHF, chronic ([place]) 09/05/2015  . LLQ pain   . Type 2 diabetes mellitus without complication ([place]) 93/81/8299  . Essential hypertension   . 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 Hypertensive urgency 04/22/2014  . Acute CHF ([place]) 04/22/2014  . S/P cesarean section 04/11/2014  . Acute pulmonary edema ([place]) 04/11/2014  . Postoperative anemia 04/11/2014  . Elevated serum creatinine 04/11/2014  . Preeclampsia, severe 04/09/2014  . Pre-eclampsia superimposed on chronic hypertension, antepartum 04/08/2014  . Dyspnea 04/08/2014  . Polyhydramnios in third trimester, antepartum 03/14/2014  . Abnormal maternal glucose tolerance, antepartum 03/11/2014  . High-risk pregnancy 03/11/2014  . Pre-existing essential hypertension complicating  pregnancy 37/16/9678  . Impaired glucose tolerance during pregnancy, antepartum 11/27/2013  . Leiomyoma of uterus 11/22/2013  . History of gestational diabetes in prior pregnancy, currently pregnant in first trimester 11/22/2013  . Hx of preeclampsia, prior pregnancy, currently pregnant 11/22/2013  . Short interval between pregnancies affecting pregnancy, antepartum 11/22/2013  . Supervision of high-risk pregnancy of elderly primigravida (>= 54 years old at delivery), third trimester 11/22/2013  . Miscarriage 03/19/2013  . Major depressive disorder, single episode, unspecified 09/27/2011  . Hypertension   . Hypertensive cardiovascular disease   . Microcytic anemia   . Osteoarthrosis involving lower leg 03/29/2011  . Hypokalemia 12/12/2009  . OSA on CPAP 12/09/2009  . Morbid obesity ([place]) 04/16/2009   PCP:  Rosita Fire, MD Pharmacy:   [facility] [address] [place] [address] Phone: [phone] Fax: [phone]   Readmission Risk Interventions Readmission Risk Prevention Plan 10/08/2020 06/10/2020 03/28/2020  Transportation Screening Complete Complete Complete  HRI or Home Care Consult - - Complete  Social Work Consult for [place] Planning/Counseling - - Complete  Palliative Care Screening - - Not Applicable  Medication Review Press photographer) Complete Complete Complete  HRI or Home Care Consult Complete Complete -  SW Recovery Care/Counseling Consult Complete Complete -  Palliative Care Screening Not Applicable Not Applicable -  [place] Not Applicable Not Applicable -  Some recent data might be hidden

## 2020-10-09 DIAGNOSIS — I5043 Acute on chronic combined systolic (congestive) and diastolic (congestive) heart failure: Secondary | ICD-10-CM | POA: Diagnosis not present

## 2020-10-09 DIAGNOSIS — I10 Essential (primary) hypertension: Secondary | ICD-10-CM | POA: Diagnosis not present

## 2020-10-09 DIAGNOSIS — E1165 Type 2 diabetes mellitus with hyperglycemia: Secondary | ICD-10-CM | POA: Diagnosis not present

## 2020-10-09 DIAGNOSIS — G4733 Obstructive sleep apnea (adult) (pediatric): Secondary | ICD-10-CM | POA: Diagnosis not present

## 2020-10-09 LAB — BASIC METABOLIC PANEL
Anion gap: 8 (ref 5–15)
BUN: 26 mg/dL — ABNORMAL HIGH (ref 6–20)
CO2: 26 mmol/L (ref 22–32)
Calcium: 8.7 mg/dL — ABNORMAL LOW (ref 8.9–10.3)
Chloride: 98 mmol/L (ref 98–111)
Creatinine, Ser: 1.4 mg/dL — ABNORMAL HIGH (ref 0.44–1.00)
GFR, Estimated: 47 mL/min — ABNORMAL LOW (ref >60)
Glucose, Bld: 144 mg/dL — ABNORMAL HIGH (ref 70–99)
Potassium: 3.5 mmol/L (ref 3.5–5.1)
Sodium: 132 mmol/L — ABNORMAL LOW (ref 135–145)

## 2020-10-09 LAB — RAPID URINE DRUG SCREEN, HOSP PERFORMED
Amphetamines: NOT DETECTED
Barbiturates: NOT DETECTED
Benzodiazepines: NOT DETECTED
Cocaine: NOT DETECTED
Opiates: NOT DETECTED
Tetrahydrocannabinol: POSITIVE — AB

## 2020-10-09 LAB — BRAIN NATRIURETIC PEPTIDE: B Natriuretic Peptide: 140 pg/mL — ABNORMAL HIGH (ref 0.0–100.0)

## 2020-10-09 LAB — GLUCOSE, CAPILLARY
Glucose-Capillary: 149 mg/dL — ABNORMAL HIGH (ref 70–99)
Glucose-Capillary: 178 mg/dL — ABNORMAL HIGH (ref 70–99)
Glucose-Capillary: 247 mg/dL — ABNORMAL HIGH (ref 70–99)
Glucose-Capillary: 195 mg/dL — ABNORMAL HIGH (ref 70–99)

## 2020-10-09 LAB — MAGNESIUM: Magnesium: 1.6 mg/dL — ABNORMAL LOW (ref 1.7–2.4)

## 2020-10-09 MED ORDER — MAGNESIUM SULFATE 4 GM/100ML IV SOLN
4.0000 g | Freq: Once | INTRAVENOUS | Status: AC
  Administered 2020-10-09: 4 g via INTRAVENOUS
  Filled 2020-10-09: qty 100

## 2020-10-09 MED ORDER — POTASSIUM CHLORIDE CRYS ER 20 MEQ PO TBCR
40.0000 meq | EXTENDED_RELEASE_TABLET | Freq: Two times a day (BID) | ORAL | Status: DC
  Administered 2020-10-09 – 2020-10-13 (×8): 40 meq via ORAL
  Filled 2020-10-09 (×5): qty 2
  Filled 2020-10-09: qty 4
  Filled 2020-10-09 (×2): qty 2

## 2020-10-09 NOTE — Progress Notes (Signed)
Patient refusing to wear CPAP at this time.  Patient resting on room air.  RT will continue to monitor.

## 2020-10-09 NOTE — Progress Notes (Signed)
Patient alert and oriented. Patient was able to have a full conversation with me and stated that she feels better, and thought the magnesium caused her to be drowsy.

## 2020-10-09 NOTE — Progress Notes (Signed)
PROGRESS NOTE   Chelsey Santiago  JHE:[id number] DOB: 1976-04-13 DOA: 10/07/2020 PCP: Rosita Fire, MD   Chief Complaint  Patient presents with  . Chest Pain   Level of care: Telemetry  Brief Admission History:  45 y.o. female with past medical history relevant for obesity/OSA, as well as HTN, DM2, chronic combined systolic and diastolic CHF/NICM(EF 18-56% by echo in 12/2015 with cath showing normal cors, EF at 45% by repeat echo in 10/2018, at 35% in 09/11/2020- presents to the ED with complaints of dyspnea and chest discomfort -No fever  Or chills , no productive cough no pleuritic symptoms -Patient noted some abdominal girth increase, as well as 6 pound weight gain over the last 5 days with orthopnea, dyspnea on exertion despite adjusting torsemide dosage -Patient admits to dietary indiscretion including excessive fluid and salt intake the previous week -Chest x-ray is consistent with CHF -Troponin is 17, repeat troponin 12, EKG with sinus tach and LVH -BNP Up to 2225, BNP was 261 on 09/13/2020 and 388 on 09/12/2020 -CBC with hemoglobin of 11.1 MCV and MCH are low -Creatinine is 0.9 with a BUN of 16, sodium and potassium WNL -She had significant dyspnea at rest and with minimal activity EDP gave 80 mg of IV Lasix patient voided 1.5 L in the ED  Assessment & Plan:   Principal Problem:   Acute on 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 Frequent hospitalizations thought due to long history of poor compliance with diet and medication, multiple hospitalizations for same, diuresing well on IV lasix.  Continue for now, hopefully can transition tomorrow.  Repeat CXR in AM.   I/O  documentation has been poor and thought inaccurate what is currently reported.    Filed Weights   10/07/20 1009 10/08/20 0500 10/09/20 0500  Weight: 99.8 kg 97.8 kg 99.8 kg   I/O last 3 completed shifts: In: 106 [P.O.:1480] Out: 12 [Urine:2; Stool:10] Total I/O In: -  Out: 2 [Stool:2]  DM type 2 with neurological complications, uncontrolled - as evidenced by an A1c of 9.8% - continue current mgmt and follow CBG closely.  Compliance remains very poor.  Recommend outpatient referral to diabetes education and nutrition.    Essential hypertension - BPs better controlled, following.   DVT prophylaxis: heparin  Code Status: full  Family Communication: discussed with pt at bedside  Disposition: anticipate return home  Status is: Inpatient  Remains inpatient appropriate because:IV treatments appropriate due to intensity of illness or inability to take PO and Inpatient level of care appropriate due to severity of illness  Dispo: The patient is from: Home              Anticipated d/c is to: Home              Patient currently is not medically stable to d/c.   Difficult to place patient No  Consultants:   n/a  Procedures:   n/a  Antimicrobials:  n/a   Subjective: Pt reports she still feels like she is having fluid in the abdomen.  Pt sitting in bed talking on telephone.  No SOB. No CP.  Does not appear to be in any distress.     Objective: Vitals:  10/09/20 0500 10/09/20 0512 10/09/20 0833 10/09/20 0954  BP:  109/76  (!) 138/96  Pulse:  91    Resp:  18    Temp:  98.4 F (36.9 C)    TempSrc:  Oral    SpO2:  96% 98%   Weight: 99.8 kg     Height:        Intake/Output Summary (Last 24 hours) at 10/09/2020 1325 Last data filed at 10/09/2020 1130 Gross per 24 hour  Intake 760 ml  Output 4 ml  Net 756 ml   Filed Weights   10/07/20 1009 10/08/20 0500 10/09/20 0500  Weight: 99.8 kg 97.8 kg 99.8 kg    Examination:  General exam: MO female, awake, alert, lying in  bed, NAD, Appears calm and comfortable  Respiratory system: Clear to auscultation. Respiratory effort normal. Cardiovascular system: normal S1 & S2 heard. No JVD, murmurs, rubs, gallops or clicks. No pedal edema. Gastrointestinal system: Abdomen is nondistended, soft and nontender. No organomegaly or masses felt. Normal bowel sounds heard. Central nervous system: Alert and oriented. No focal neurological deficits. Extremities: trace pretibial edema BLEs.  Symmetric 5 x 5 power. Skin: No rashes, lesions or ulcers Psychiatry: Judgement and insight appear poor. Mood & affect appropriate.   Data Reviewed: I have personally reviewed following labs and imaging studies  CBC: Recent Labs  Lab 10/07/20 1047 10/08/20 0551  WBC 9.4 9.3  HGB 11.1* 12.3  HCT 36.2 39.8  MCV 74.5* 74.1*  PLT 254 024    Basic Metabolic Panel: Recent Labs  Lab 10/07/20 1047 10/08/20 0551 10/09/20 0552  NA 137 137 132*  K 3.6 3.4* 3.5  CL 105 99 98  CO2 23 27 26   GLUCOSE 127* 182* 144*  BUN 16 22* 26*  CREATININE 0.96 1.49* 1.40*  CALCIUM 8.9 9.0 8.7*  MG  --   --  1.6*    GFR: Estimated Creatinine Clearance: 60.5 mL/min (A) (by C-G formula based on SCr of 1.4 mg/dL (H)).  Liver Function Tests: Recent Labs  Lab 10/07/20 1047  AST 17  ALT 14  ALKPHOS 72  BILITOT 1.0  PROT 7.4  ALBUMIN 3.3*    CBG: Recent Labs  Lab 10/08/20 1125 10/08/20 1616 10/08/20 2155 10/09/20 0739 10/09/20 1119  GLUCAP 171* 154* 211* 149* 178*    Recent Results (from the past 240 hour(s))  SARS CORONAVIRUS 2 (TAT 6-24 HRS) Nasopharyngeal Nasopharyngeal Swab     Status: None   Collection Time: 10/07/20 12:03 PM   Specimen: Nasopharyngeal Swab  Result Value Ref Range Status   SARS Coronavirus 2 NEGATIVE NEGATIVE Final    Comment: (NOTE) SARS-CoV-2 target nucleic acids are NOT DETECTED.  The SARS-CoV-2 RNA is generally detectable in upper and lower respiratory specimens during the acute phase of infection.  Negative results do not preclude SARS-CoV-2 infection, do not rule out co-infections with other pathogens, and should not be used as the sole basis for treatment or other patient management decisions. Negative results must be combined with clinical observations, patient history, and epidemiological information. The expected result is Negative.  Fact Sheet for Patients: SugarRoll.be  Fact Sheet for Healthcare Providers: https://www.woods-mathews.com/  This test is not yet approved or cleared by the [place] FDA and  has been authorized for detection and/or diagnosis of SARS-CoV-2 by FDA under an Emergency Use Authorization (EUA). This EUA will remain  in effect (meaning this test can be used) for the duration of the COVID-19 declaration under Se ction 564(b)(1) of the  Act, 21 U.S.C. section 360bbb-3(b)(1), unless the authorization is terminated or revoked sooner.  Performed at [facility], [place] [address], [place], [place] [address]      Radiology Studies: DG Chest 2 View  Result Date: 10/08/2020 CLINICAL DATA:  Shortness of breath. EXAM: CHEST - 2 VIEW COMPARISON:  10/07/2020. FINDINGS: Cardiomegaly again noted. Interim near complete clearing of bilateral interstitial prominence suggesting clearing CHF. No pleural effusion or pneumothorax. IMPRESSION: Cardiomegaly again noted. Interim near complete clearing of bilateral interstitial prominence suggesting clearing CHF. Electronically Signed   By: [place]   On: 10/08/2020 06:00    Scheduled Meds: . ARIPiprazole  10 mg Oral Daily  . aspirin EC  81 mg Oral Daily  . carvedilol  50 mg Oral BID WC  . clonazePAM  0.5 mg Oral BID  . furosemide  40 mg Intravenous BID  . gabapentin  800 mg Oral TID  . heparin  5,000 Units Subcutaneous Q8H  . hydrALAZINE  100 mg Oral TID  . insulin glargine  30 Units Subcutaneous QHS  . isosorbide mononitrate  30 mg Oral Daily  . linaclotide   145 mcg Oral QAC breakfast  . mometasone-formoterol  2 puff Inhalation BID  . potassium chloride SA  40 mEq Oral Daily  . pyridostigmine  60 mg Oral Q8H  . sacubitril-valsartan  1 tablet Oral BID  . sodium chloride flush  3 mL Intravenous Q12H  . sodium chloride flush  3 mL Intravenous Q12H  . spironolactone  12.5 mg Oral Daily  . traZODone  200 mg Oral QHS   Continuous Infusions: . sodium chloride      LOS: 2 days   Time spent: 36 mins  Willia Lampert Wynetta Emery, MD How to contact the [facility] Attending or Consulting provider [place] or covering provider during after hours [place], for this patient?  1. Check the care team in [facility] and look for a) attending/consulting [facility] provider listed and b) the [facility] team listed 2. Log into [email or web address] and use [place]'s universal password to access. If you do not have the password, please contact the hospital operator. 3. Locate the [facility] provider you are looking for under [facility] and page to a number that you can be directly reached. 4. If you still have difficulty reaching the provider, please page the [facility] (Director on Call) for the Hospitalists listed on amion for assistance.  10/09/2020, 1:25 PM

## 2020-10-10 ENCOUNTER — Inpatient Hospital Stay (HOSPITAL_COMMUNITY): Payer: Medicaid Other

## 2020-10-10 DIAGNOSIS — K5792 Diverticulitis of intestine, part unspecified, without perforation or abscess without bleeding: Secondary | ICD-10-CM | POA: Diagnosis not present

## 2020-10-10 DIAGNOSIS — G4733 Obstructive sleep apnea (adult) (pediatric): Secondary | ICD-10-CM | POA: Diagnosis not present

## 2020-10-10 DIAGNOSIS — E1165 Type 2 diabetes mellitus with hyperglycemia: Secondary | ICD-10-CM | POA: Diagnosis not present

## 2020-10-10 DIAGNOSIS — I5043 Acute on chronic combined systolic (congestive) and diastolic (congestive) heart failure: Secondary | ICD-10-CM | POA: Diagnosis not present

## 2020-10-10 DIAGNOSIS — I10 Essential (primary) hypertension: Secondary | ICD-10-CM | POA: Diagnosis not present

## 2020-10-10 LAB — BASIC METABOLIC PANEL
Anion gap: 6 (ref 5–15)
BUN: 30 mg/dL — ABNORMAL HIGH (ref 6–20)
CO2: 25 mmol/L (ref 22–32)
Calcium: 8.6 mg/dL — ABNORMAL LOW (ref 8.9–10.3)
Chloride: 101 mmol/L (ref 98–111)
Creatinine, Ser: 1.51 mg/dL — ABNORMAL HIGH (ref 0.44–1.00)
GFR, Estimated: 43 mL/min — ABNORMAL LOW (ref >60)
Glucose, Bld: 164 mg/dL — ABNORMAL HIGH (ref 70–99)
Potassium: 4.3 mmol/L (ref 3.5–5.1)
Sodium: 132 mmol/L — ABNORMAL LOW (ref 135–145)

## 2020-10-10 LAB — GLUCOSE, CAPILLARY
Glucose-Capillary: 149 mg/dL — ABNORMAL HIGH (ref 70–99)
Glucose-Capillary: 212 mg/dL — ABNORMAL HIGH (ref 70–99)
Glucose-Capillary: 192 mg/dL — ABNORMAL HIGH (ref 70–99)
Glucose-Capillary: 220 mg/dL — ABNORMAL HIGH (ref 70–99)

## 2020-10-10 LAB — MAGNESIUM: Magnesium: 1.9 mg/dL (ref 1.7–2.4)

## 2020-10-10 MED ORDER — METRONIDAZOLE 500 MG/100ML IV SOLN
500.0000 mg | Freq: Three times a day (TID) | INTRAVENOUS | Status: DC
  Administered 2020-10-10 – 2020-10-13 (×9): 500 mg via INTRAVENOUS
  Filled 2020-10-10 (×9): qty 100

## 2020-10-10 MED ORDER — SODIUM CHLORIDE 0.9 % IV SOLN
2.0000 g | INTRAVENOUS | Status: DC
  Administered 2020-10-10 – 2020-10-12 (×3): 2 g via INTRAVENOUS
  Filled 2020-10-10 (×3): qty 20

## 2020-10-10 MED ORDER — DIPHENHYDRAMINE HCL 50 MG/ML IJ SOLN
12.5000 mg | Freq: Once | INTRAMUSCULAR | Status: AC
  Administered 2020-10-10: 12.5 mg via INTRAVENOUS
  Filled 2020-10-10: qty 1

## 2020-10-10 MED ORDER — OXYCODONE HCL 5 MG PO TABS
5.0000 mg | ORAL_TABLET | Freq: Once | ORAL | Status: AC
  Administered 2020-10-10: 5 mg via ORAL
  Filled 2020-10-10 (×2): qty 1

## 2020-10-10 MED ORDER — IOHEXOL 9 MG/ML PO SOLN
ORAL | Status: AC
  Filled 2020-10-10: qty 1000

## 2020-10-10 NOTE — Plan of Care (Signed)
  Problem: Acute Rehab PT Goals(only PT should resolve) Goal: Patient Will Transfer Sit To/From Stand Outcome: Progressing Flowsheets (Taken 10/10/2020 0857) Patient will transfer sit to/from stand: with modified independence Goal: Pt Will Transfer Bed To Chair/Chair To Bed Outcome: Progressing Flowsheets (Taken 10/10/2020 0857) Pt will Transfer Bed to Chair/Chair to Bed: with modified independence Goal: Pt Will Ambulate Outcome: Progressing Flowsheets (Taken 10/10/2020 0857) Pt will Ambulate:  100 feet  with least restrictive assistive device  with supervision Goal: Pt/caregiver will Perform Home Exercise Program Outcome: Progressing Flowsheets (Taken 10/10/2020 0857) Pt/caregiver will Perform Home Exercise Program:  For increased strengthening  For improved balance  Independently  8:58 AM, 10/10/20 Mearl Latin PT, DPT Physical Therapist at [facility]

## 2020-10-10 NOTE — Evaluation (Signed)
Physical Therapy Evaluation Patient Details Name: Chelsey Santiago MRN: [id number] DOB: 12-16-1975 Today's Date: 10/10/2020   History of Present Illness  Chelsey Santiago  is a 45 y.o. female with past medical history relevant for obesity/OSA, as well as HTN, DM2,  chronic combined systolic and diastolic CHF/NICM (EF 42-35% by echo in 12/2015 with cath showing normal cors, EF at 45% by repeat echo in 10/2018, at 35% in 09/11/2020- presents to the ED with complaints of dyspnea and chest discomfort    Clinical Impression  Patient limited for functional mobility as stated below secondary to BLE weakness, fatigue and impaired standing balance. Patient does not require assist for bed mobility. Patient unsteady about transfer to standing reaching for nearby objects for support. Patient able to ambulate several steps with unsteady cadence reaching for support. Gait improves with use of RW and patient demonstrates good balance with use of RW. Patient limited by fatigue and impaired activity tolerance. Patient ends session seated EOB. Patient will benefit from continued physical therapy in hospital and recommended venue below to increase strength, balance, endurance for safe ADLs and gait.     Follow Up Recommendations Home health PT    Equipment Recommendations  Rolling walker with 5" wheels;3in1 (PT)    Recommendations for Other Services       Precautions / Restrictions Precautions Precautions: Fall Restrictions Weight Bearing Restrictions: No      Mobility  Bed Mobility Overal bed mobility: Modified Independent                  Transfers Overall transfer level: Needs assistance Equipment used: None;Rolling walker (2 wheeled) Transfers: Sit to/from [employer] Transfers Sit to Stand: Min guard Stand pivot transfers: Min guard       General transfer comment: unsteady trasfer to standing without AD reaching for nearby objects for support, unsteadiness improves with  RW  Ambulation/Gait Ambulation/Gait assistance: Min guard Gait Distance (Feet): 75 Feet Assistive device: Rolling walker (2 wheeled) Gait Pattern/deviations: Decreased step length - right;Decreased step length - left;Decreased stride length Gait velocity: decreased   General Gait Details: able to ambulate without AD with unsteady cadence, gait improves with use of RW for support; slow labored cadence  Stairs            Wheelchair Mobility    Modified Rankin (Stroke Patients Only)       Balance Overall balance assessment: Needs assistance Sitting-balance support: Feet supported Sitting balance-Leahy Scale: Good Sitting balance - Comments: seated EOB   Standing balance support: Bilateral upper extremity supported Standing balance-Leahy Scale: Good Standing balance comment: with RW                             Pertinent Vitals/Pain Pain Assessment: 0-10 Pain Score: 10-Worst pain ever Pain Location: L abdomen Pain Descriptors / Indicators: Aching Pain Intervention(s): Limited activity within patient's tolerance;Monitored during session;Repositioned    Home Living Family/patient expects to be discharged to:: Private residence Living Arrangements: Children Available Help at Discharge: Family;Personal care attendant (PCA 2 hours/day) Type of Home: House Home Access: Stairs to enter Entrance Stairs-Rails: Right Entrance Stairs-Number of Steps: 2 Home Layout: One level Home Equipment: Cane - single point      Prior Function Level of Independence: Needs assistance   Gait / Transfers Assistance Needed: short distance community ambulator with [facility]  ADL's / Homemaking Assistance Needed: Requires assist from children and aid (2 hours/day)        Hand  Dominance        Extremity/Trunk Assessment   Upper Extremity Assessment Upper Extremity Assessment: Generalized weakness    Lower Extremity Assessment Lower Extremity Assessment: Generalized  weakness    Cervical / Trunk Assessment Cervical / Trunk Assessment: Normal  Communication   Communication: No difficulties  Cognition Arousal/Alertness: Awake/alert Behavior During Therapy: WFL for tasks assessed/performed Overall Cognitive Status: Within Functional Limits for tasks assessed                                        General Comments      Exercises     Assessment/Plan    PT Assessment Patient needs continued PT services  PT Problem List Decreased strength;Decreased mobility;Decreased activity tolerance;Cardiopulmonary status limiting activity;Decreased balance;Pain       PT Treatment Interventions DME instruction;Therapeutic exercise;Gait training;Balance training;Stair training;Neuromuscular re-education;Functional mobility training;Therapeutic activities;Patient/family education    PT Goals (Current goals can be found in the Care Plan section)  Acute Rehab PT Goals Patient Stated Goal: Return home PT Goal Formulation: With patient Time For Goal Achievement: 10/24/20 Potential to Achieve Goals: Good    Frequency Min 3X/week   Barriers to discharge        Co-evaluation               AM-PAC PT "6 Clicks" Mobility  Outcome Measure Help needed turning from your back to your side while in a flat bed without using bedrails?: None Help needed moving from lying on your back to sitting on the side of a flat bed without using bedrails?: None Help needed moving to and from a bed to a chair (including a wheelchair)?: A Little Help needed standing up from a chair using your arms (e.g., wheelchair or bedside chair)?: A Little Help needed to walk in hospital room?: A Little Help needed climbing 3-5 steps with a railing? : A Lot 6 Click Score: 19    End of Session Equipment Utilized During Treatment: Gait belt Activity Tolerance: Patient tolerated treatment well;Patient limited by fatigue Patient left: in bed;with call bell/phone within  reach Nurse Communication: Mobility status PT Visit Diagnosis: Unsteadiness on feet (R26.81);Other abnormalities of gait and mobility (R26.89);Muscle weakness (generalized) (M62.81)    Time: 7741-2878 PT Time Calculation (min) (ACUTE ONLY): 9 min   Charges:   PT Evaluation $PT Eval Low Complexity: 1 Low          8:56 AM, 10/10/20 Mearl Latin PT, DPT Physical Therapist at [facility]

## 2020-10-10 NOTE — Progress Notes (Signed)
Patient refused to use CPAP tonight.  She states her stomach is upset and she doesn't want to wear it.  If she changes her mind she will have RN call RT.  RT will continue to monitor.

## 2020-10-10 NOTE — TOC Transition Note (Signed)
Transition of Care [facility]) - CM/SW Discharge Note   Patient Details  Name: Chelsey Santiago MRN: [id number] Date of Birth: [birth year]  Transition of Care [facility]) CM/SW Contact:  Salome Arnt, LCSW Phone Number: 10/10/2020, 10:35 AM   Clinical Narrative:  Discussed outpatient PT with pt who is agreeable. Referral made to [facility]. Confirmed with Freda Munro with [employer] and 3N1 is being drop shipped to pt's home.       Final next level of care: OP Rehab Barriers to Discharge: Barriers Resolved   Patient Goals and CMS Choice Patient states their goals for this hospitalization and ongoing recovery are:: to go home, CMS Medicare.gov Compare Post Acute Care list provided to:: Patient Choice offered to / list presented to : Patient  Discharge Placement                       Discharge Plan and Services                DME Arranged: 3-N-1 DME Agency: [employer] Date DME Agency Contacted: 10/10/20 Time DME Agency Contacted: 1034 Representative spoke with at DME Agency: [place] ([place]) Interventions     Readmission Risk Interventions Readmission Risk Prevention Plan 10/08/2020 06/10/2020 03/28/2020  Transportation Screening Complete Complete Complete  HRI or Home Care Consult - - Complete  Social Work Consult for [place] Planning/Counseling - - Complete  Palliative Care Screening - - Not Applicable  Medication Review Press photographer) Complete Complete Complete  HRI or Home Care Consult Complete Complete -  SW Recovery Care/Counseling Consult Complete Complete -  Palliative Care Screening Not Applicable Not Applicable -  [place] Not Applicable Not Applicable -  Some recent data might be hidden

## 2020-10-10 NOTE — Progress Notes (Signed)
PROGRESS NOTE   Chelsey Santiago  QPY:[id number] DOB: 02-14-1976 DOA: 10/07/2020 PCP: Rosita Fire, MD   Chief Complaint  Patient presents with  . Chest Pain   Level of care: Telemetry  Brief Admission History:  45 y.o. female with past medical history relevant for obesity/OSA, as well as HTN, DM2, chronic combined systolic and diastolic CHF/NICM(EF 12-45% by echo in 12/2015 with cath showing normal cors, EF at 45% by repeat echo in 10/2018, at 35% in 09/11/2020- presents to the ED with complaints of dyspnea and chest discomfort -No fever  Or chills , no productive cough no pleuritic symptoms -Patient noted some abdominal girth increase, as well as 6 pound weight gain over the last 5 days with orthopnea, dyspnea on exertion despite adjusting torsemide dosage -Patient admits to dietary indiscretion including excessive fluid and salt intake the previous week -Chest x-ray is consistent with CHF -Troponin is 17, repeat troponin 12, EKG with sinus tach and LVH -BNP Up to 2225, BNP was 261 on 09/13/2020 and 388 on 09/12/2020 -CBC with hemoglobin of 11.1 MCV and MCH are low -Creatinine is 0.9 with a BUN of 16, sodium and potassium WNL -She had significant dyspnea at rest and with minimal activity EDP gave 80 mg of IV Lasix patient voided 1.5 L in the ED  Assessment & Plan:   Principal Problem:   Acute on 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  No evidence of perforation or abscess seen.  Starting IV ceftriaxone and metronidazole.  Liquid diet, advance as tolerated to soft diet.    Acute combined systolic diastolic heart failure - Improved but patient reports persistent abdominal edema. Frequent hospitalizations thought due to  long history of poor compliance with diet and medication, multiple hospitalizations for same, diuresing well on IV lasix.  Continue for now, hopefully can transition tomorrow.  Repeat CXR in AM.   I/O documentation has been poor and thought inaccurate what is currently reported.    Filed Weights   10/08/20 0500 10/09/20 0500 10/10/20 0300  Weight: 97.8 kg 99.8 kg 100.3 kg   I/O last 3 completed shifts: In: 2024.9 [P.O.:2000; IV Piggyback:24.9] Out: 380 [Urine:375; Stool:5] Total I/O In: 410.7 [P.O.:240; IV Piggyback:170.7] Out: -   DM type 2 with neurological complications, uncontrolled - as evidenced by an A1c of 9.8% - continue current mgmt and follow CBG closely.  Compliance remains very poor.  Recommend outpatient referral to diabetes education and nutrition.    Essential hypertension - BPs better controlled, following.   DVT prophylaxis: heparin  Code Status: full  Family Communication: discussed with pt at bedside  Disposition: anticipate return home  Status is: Inpatient  Remains inpatient appropriate because:IV treatments appropriate due to intensity of illness or inability to take PO and Inpatient level of care appropriate due to severity of illness  Dispo: The patient is from: Home              Anticipated d/c is to: Home              Patient currently is not medically stable to d/c.   Difficult to place patient No  Consultants:   n/a  Procedures:   n/a  Antimicrobials:  Ceftriaxone 5/27>> Metronidazole 5/27>>  Subjective:  Pt c/o of severe LLQ abdominal pain.  It started overnight.       Objective: Vitals:   10/10/20 0849 10/10/20 1103 10/10/20 1432 10/10/20 1524  BP:  108/62 109/71 123/76  Pulse:   91 95  Resp:   18 18  Temp:   98.6 F (37 C)   TempSrc:      SpO2: 97%  94% 100%  Weight:      Height:        Intake/Output Summary (Last 24 hours) at 10/10/2020 1822 Last data filed at 10/10/2020 1700 Gross per 24 hour  Intake 1170.72 ml  Output 375  ml  Net 795.72 ml   Filed Weights   10/08/20 0500 10/09/20 0500 10/10/20 0300  Weight: 97.8 kg 99.8 kg 100.3 kg    Examination:  General exam: MO female, awake, alert, lying in bed, NAD, Appears calm and comfortable  Respiratory system: Clear to auscultation. Respiratory effort normal. Cardiovascular system: normal S1 & S2 heard. No JVD, murmurs, rubs, gallops or clicks. No pedal edema. Gastrointestinal system: Abdomen is nondistended, soft and severe LLQ abdominal pain. Light guarding. No organomegaly or masses felt. Normal bowel sounds heard. Central nervous system: Alert and oriented. No focal neurological deficits. Extremities: trace pretibial edema BLEs.  Symmetric 5 x 5 power. Skin: No rashes, lesions or ulcers Psychiatry: Judgement and insight appear poor. Mood & affect appropriate.   Data Reviewed: I have personally reviewed following labs and imaging studies  CBC: Recent Labs  Lab 10/07/20 1047 10/08/20 0551  WBC 9.4 9.3  HGB 11.1* 12.3  HCT 36.2 39.8  MCV 74.5* 74.1*  PLT 254 401    Basic Metabolic Panel: Recent Labs  Lab 10/07/20 1047 10/08/20 0551 10/09/20 0552 10/10/20 0628  NA 137 137 132* 132*  K 3.6 3.4* 3.5 4.3  CL 105 99 98 101  CO2 23 27 26 25   GLUCOSE 127* 182* 144* 164*  BUN 16 22* 26* 30*  CREATININE 0.96 1.49* 1.40* 1.51*  CALCIUM 8.9 9.0 8.7* 8.6*  MG  --   --  1.6* 1.9    GFR: Estimated Creatinine Clearance: 56.2 mL/min (A) (by C-G formula based on SCr of 1.51 mg/dL (H)).  Liver Function Tests: Recent Labs  Lab 10/07/20 1047  AST 17  ALT 14  ALKPHOS 72  BILITOT 1.0  PROT 7.4  ALBUMIN 3.3*    CBG: Recent Labs  Lab 10/09/20 1616 10/09/20 2107 10/10/20 0739 10/10/20 1117 10/10/20 1631  GLUCAP 247* 195* 149* 212* 192*    Recent Results (from the past 240 hour(s))  SARS CORONAVIRUS 2 (TAT 6-24 HRS) Nasopharyngeal Nasopharyngeal Swab     Status: None   Collection Time: 10/07/20 12:03 PM   Specimen: Nasopharyngeal Swab   Result Value Ref Range Status   SARS Coronavirus 2 NEGATIVE NEGATIVE Final    Comment: (NOTE) SARS-CoV-2 target nucleic acids are NOT DETECTED.  The SARS-CoV-2 RNA is generally detectable in upper and lower respiratory specimens during the acute phase of infection. Negative results do not preclude SARS-CoV-2 infection, do not rule out co-infections with other pathogens, and should not be used as the sole basis for treatment or other patient management decisions. Negative results must be combined with clinical observations, patient history, and epidemiological information. The expected result is Negative.  Fact Sheet for Patients: SugarRoll.be  Fact Sheet for Healthcare Providers: https://www.woods-mathews.com/  This test is not yet approved or cleared by the [place] FDA and  has been authorized for detection and/or diagnosis of  SARS-CoV-2 by FDA under an Emergency Use Authorization (EUA). This EUA will remain  in effect (meaning this test can be used) for the duration of the COVID-19 declaration under Se ction 564(b)(1) of the Act, 21 U.S.C. section 360bbb-3(b)(1), unless the authorization is terminated or revoked sooner.  Performed at [facility], [place] [address], [place], [place] [address]      Radiology Studies: CT ABDOMEN PELVIS WO CONTRAST  Result Date: 10/10/2020 CLINICAL DATA:  45 year old female with history of abdominal distension and left lower quadrant abdominal pain. Vomiting and abdominal distension for the past 2 days. EXAM: CT ABDOMEN AND PELVIS WITHOUT CONTRAST TECHNIQUE: Multidetector CT imaging of the abdomen and pelvis was performed following the standard protocol without IV contrast. COMPARISON:  CT the abdomen and pelvis 07/04/2019. FINDINGS: Lower chest: Linear scarring or subsegmental atelectasis in the left lower lobe. Cardiomegaly. Calcifications of the mitral annulus. Hepatobiliary: No definite  suspicious cystic or solid hepatic lesions are confidently identified on today's noncontrast CT examination. Status post cholecystectomy. Pancreas: No definite pancreatic mass or peripancreatic fluid collections or inflammatory changes are noted on today's noncontrast CT examination. Spleen: Unremarkable. Adrenals/Urinary Tract: Unenhanced appearance of the kidneys and bilateral adrenal glands is normal. No hydroureteronephrosis. Urinary bladder is normal in appearance. Stomach/Bowel: Unenhanced appearance of the stomach is normal. No pathologic dilatation of small bowel or colon. A few scattered colonic diverticulae are noted. In the region of the distal descending colon there are surrounding inflammatory changes indicative of acute diverticulitis. No diverticular abscess or signs of frank perforation are noted at this time. Normal appendix. Vascular/Lymphatic: No atherosclerotic calcifications in the abdominal aorta or pelvic vasculature. No lymphadenopathy noted in the abdomen or pelvis. Reproductive: Status post hysterectomy.  Ovaries are atrophic. Other: No significant volume of ascites.  No pneumoperitoneum. Musculoskeletal: There are no aggressive appearing lytic or blastic lesions noted in the visualized portions of the skeleton. IMPRESSION: 1. Findings are compatible with acute diverticulitis of the distal descending colon. No signs of diverticular abscess or frank perforation are noted at this time. 2. Cardiomegaly. 3. Additional incidental findings, as above. Electronically Signed   By: Vinnie Langton M.D.   On: 10/10/2020 14:43   DG CHEST PORT 1 VIEW  Result Date: 10/10/2020 CLINICAL DATA:  CHF exacerbation, diabetes mellitus, hypertension EXAM: PORTABLE CHEST 1 VIEW COMPARISON:  Portable exam 0459 hours compared to 10/08/2020 FINDINGS: Enlargement of cardiac silhouette. Mediastinal contours and pulmonary vascularity normal. Lungs clear. No infiltrate, pleural effusion, or pneumothorax. Osseous  structures unremarkable. IMPRESSION: Enlargement of cardiac silhouette. No acute pulmonary edema. Electronically Signed   By: Lavonia Dana M.D.   On: 10/10/2020 08:05    Scheduled Meds: . ARIPiprazole  10 mg Oral Daily  . aspirin EC  81 mg Oral Daily  . carvedilol  50 mg Oral BID WC  . clonazePAM  0.5 mg Oral BID  . gabapentin  800 mg Oral TID  . heparin  5,000 Units Subcutaneous Q8H  . hydrALAZINE  100 mg Oral TID  . insulin glargine  30 Units Subcutaneous QHS  . iohexol      . isosorbide mononitrate  30 mg Oral Daily  . linaclotide  145 mcg Oral QAC breakfast  . mometasone-formoterol  2 puff Inhalation BID  . potassium chloride SA  40 mEq Oral BID  . pyridostigmine  60 mg Oral Q8H  . sacubitril-valsartan  1 tablet Oral BID  . sodium chloride flush  3 mL Intravenous Q12H  . sodium chloride flush  3 mL Intravenous Q12H  . spironolactone  12.5 mg Oral Daily  . traZODone  200 mg Oral QHS   Continuous Infusions: . sodium chloride    . cefTRIAXone (ROCEPHIN)  IV 2 g (10/10/20 1614)  . metronidazole 500 mg (10/10/20 1617)    LOS: 3 days   Time spent: 34 mins  Amila Callies Wynetta Emery, MD How to contact the [facility] Attending or Consulting provider [place] or covering provider during after hours [place], for this patient?  1. Check the care team in [facility] and look for a) attending/consulting TRH provider listed and b) the [facility] team listed 2. Log into [email or web address] and use [place]'s universal password to access. If you do not have the password, please contact the hospital operator. 3. Locate the [facility] provider you are looking for under [facility] and page to a number that you can be directly reached. 4. If you still have difficulty reaching the provider, please page the [facility] (Director on Call) for the Hospitalists listed on amion for assistance.  10/10/2020, 6:22 PM

## 2020-10-11 DIAGNOSIS — I10 Essential (primary) hypertension: Secondary | ICD-10-CM | POA: Diagnosis not present

## 2020-10-11 DIAGNOSIS — E1165 Type 2 diabetes mellitus with hyperglycemia: Secondary | ICD-10-CM | POA: Diagnosis not present

## 2020-10-11 DIAGNOSIS — G4733 Obstructive sleep apnea (adult) (pediatric): Secondary | ICD-10-CM | POA: Diagnosis not present

## 2020-10-11 DIAGNOSIS — I5043 Acute on chronic combined systolic (congestive) and diastolic (congestive) heart failure: Secondary | ICD-10-CM | POA: Diagnosis not present

## 2020-10-11 LAB — CBC WITH DIFFERENTIAL/PLATELET
Abs Immature Granulocytes: 0.06 10*3/uL (ref 0.00–0.07)
Basophils Absolute: 0 10*3/uL (ref 0.0–0.1)
Basophils Relative: 0 %
Eosinophils Absolute: 0.1 10*3/uL (ref 0.0–0.5)
Eosinophils Relative: 1 %
HCT: 36.4 % (ref 36.0–46.0)
Hemoglobin: 10.8 g/dL — ABNORMAL LOW (ref 12.0–15.0)
Immature Granulocytes: 1 %
Lymphocytes Relative: 19 %
Lymphs Abs: 2.2 10*3/uL (ref 0.7–4.0)
MCH: 22.3 pg — ABNORMAL LOW (ref 26.0–34.0)
MCHC: 29.7 g/dL — ABNORMAL LOW (ref 30.0–36.0)
MCV: 75.1 fL — ABNORMAL LOW (ref 80.0–100.0)
Monocytes Absolute: 0.9 10*3/uL (ref 0.1–1.0)
Monocytes Relative: 8 %
Neutro Abs: 8.1 10*3/uL — ABNORMAL HIGH (ref 1.7–7.7)
Neutrophils Relative %: 71 %
Platelets: 235 10*3/uL (ref 150–400)
RBC: 4.85 MIL/uL (ref 3.87–5.11)
RDW: 15.6 % — ABNORMAL HIGH (ref 11.5–15.5)
WBC: 11.4 10*3/uL — ABNORMAL HIGH (ref 4.0–10.5)
nRBC: 0 % (ref 0.0–0.2)

## 2020-10-11 LAB — GLUCOSE, CAPILLARY
Glucose-Capillary: 180 mg/dL — ABNORMAL HIGH (ref 70–99)
Glucose-Capillary: 144 mg/dL — ABNORMAL HIGH (ref 70–99)
Glucose-Capillary: 132 mg/dL — ABNORMAL HIGH (ref 70–99)
Glucose-Capillary: 234 mg/dL — ABNORMAL HIGH (ref 70–99)

## 2020-10-11 MED ORDER — TRAMADOL HCL 50 MG PO TABS
50.0000 mg | ORAL_TABLET | Freq: Once | ORAL | Status: AC
  Administered 2020-10-11: 50 mg via ORAL
  Filled 2020-10-11 (×3): qty 1

## 2020-10-11 MED ORDER — DIPHENHYDRAMINE HCL 25 MG PO CAPS
25.0000 mg | ORAL_CAPSULE | Freq: Four times a day (QID) | ORAL | Status: DC | PRN
  Administered 2020-10-11: 25 mg via ORAL
  Filled 2020-10-11: qty 1

## 2020-10-11 MED ORDER — TORSEMIDE 20 MG PO TABS
40.0000 mg | ORAL_TABLET | Freq: Every day | ORAL | Status: DC
  Administered 2020-10-11 – 2020-10-13 (×3): 40 mg via ORAL
  Filled 2020-10-11 (×3): qty 2

## 2020-10-11 MED ORDER — OXYCODONE-ACETAMINOPHEN 5-325 MG PO TABS
1.0000 | ORAL_TABLET | Freq: Four times a day (QID) | ORAL | Status: DC | PRN
  Administered 2020-10-11: 1 via ORAL
  Administered 2020-10-11: 2 via ORAL
  Administered 2020-10-12: 1 via ORAL
  Administered 2020-10-12 (×2): 2 via ORAL
  Filled 2020-10-11 (×2): qty 1
  Filled 2020-10-11 (×3): qty 2

## 2020-10-11 NOTE — Progress Notes (Signed)
PROGRESS NOTE   Chelsey Santiago  ZOX:[id number] DOB: [birth year] DOA: 10/07/2020 PCP: Rosita Fire, MD   Chief Complaint  Patient presents with  . Chest Pain   Level of care: Telemetry  Brief Admission History:  45 y.o. female with past medical history relevant for obesity/OSA, as well as HTN, DM2, chronic combined systolic and diastolic CHF/NICM(EF 81-19% by echo in 12/2015 with cath showing normal cors, EF at 45% by repeat echo in 10/2018, at 35% in 09/11/2020- presents to the ED with complaints of dyspnea and chest discomfort -No fever  Or chills , no productive cough no pleuritic symptoms -Patient noted some abdominal girth increase, as well as 6 pound weight gain over the last 5 days with orthopnea, dyspnea on exertion despite adjusting torsemide dosage -Patient admits to dietary indiscretion including excessive fluid and salt intake the previous week -Chest x-ray is consistent with CHF -Troponin is 17, repeat troponin 12, EKG with sinus tach and LVH -BNP Up to 2225, BNP was 261 on 09/13/2020 and 388 on 09/12/2020 -CBC with hemoglobin of 11.1 MCV and MCH are low -Creatinine is 0.9 with a BUN of 16, sodium and potassium WNL -She had significant dyspnea at rest and with minimal activity EDP gave 80 mg of IV Lasix patient voided 1.5 L in the ED  Assessment & Plan:   Principal Problem:   Acute on 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  No evidence of perforation or abscess seen.  Continue IV ceftriaxone and metronidazole.  Liquid diet, advance as tolerated to soft diet.  Pt still having significant pain.  Continue IV antibiotic for at least another 24 hours.  Plan to send home on oral augmentin for full 10 day course of  therapy.  Refer to GI for colonoscopy.    Acute combined systolic diastolic heart failure - Improved but patient reports persistent abdominal edema. Frequent hospitalizations thought due to long history of poor compliance with diet and medication, multiple hospitalizations for same, diuresed well on IV lasix.    Repeat CXR in AM.   I/O documentation has been poor and thought inaccurate what is currently reported.   Restart torsemide.   Filed Weights   10/09/20 0500 10/10/20 0300 10/11/20 0500  Weight: 99.8 kg 100.3 kg 101.3 kg   I/O last 3 completed shifts: In: 1062.9 [P.O.:760; I.V.:103; IV Piggyback:199.9] Out: 375 [Urine:375] Total I/O In: 390 [P.O.:360; Other:30] Out: -   DM type 2 with neurological complications, uncontrolled - as evidenced by an A1c of 9.8% - continue current mgmt and follow CBG closely.  Compliance remains very poor.  Recommend outpatient referral to diabetes education and nutrition.    Essential hypertension - BPs better controlled, following.   DVT prophylaxis: heparin  Code Status: full  Family Communication: discussed with pt at bedside  Disposition: anticipate return home  Status is: Inpatient  Remains inpatient appropriate because:IV treatments appropriate due to intensity of illness or inability to take PO and Inpatient level of care appropriate due to severity of illness  Dispo: The patient is from: Home              Anticipated d/c is to: Home              Patient  currently is not medically stable to d/c.   Difficult to place patient No  Consultants:   n/a  Procedures:   n/a  Antimicrobials:  Ceftriaxone 5/27>> Metronidazole 5/27>>  Subjective: Pt c/o of persistent LLQ abdominal pain.  She is tolerating diet.        Objective: Vitals:   10/11/20 0438 10/11/20 0500 10/11/20 0859 10/11/20 1038  BP: (!) 101/54   113/71  Pulse: 94     Resp: 18     Temp: 98.4 F (36.9 C)     TempSrc: Oral     SpO2: 93%  98%   Weight:  101.3 kg     Height:        Intake/Output Summary (Last 24 hours) at 10/11/2020 1152 Last data filed at 10/11/2020 1100 Gross per 24 hour  Intake 932.93 ml  Output --  Net 932.93 ml   Filed Weights   10/09/20 0500 10/10/20 0300 10/11/20 0500  Weight: 99.8 kg 100.3 kg 101.3 kg    Examination:  General exam: MO female, awake, alert, lying in bed, NAD, Appears calm and comfortable  Respiratory system: Clear to auscultation. Respiratory effort normal. Cardiovascular system: normal S1 & S2 heard. No JVD, murmurs, rubs, gallops or clicks. No pedal edema. Gastrointestinal system: Abdomen is nondistended, soft and severe LLQ abdominal pain. Light guarding. No organomegaly or masses felt. Normal bowel sounds heard. Central nervous system: Alert and oriented. No focal neurological deficits. Extremities: trace pretibial edema BLEs.  Symmetric 5 x 5 power. Skin: No rashes, lesions or ulcers Psychiatry: Judgement and insight appear poor. Mood & affect appropriate.   Data Reviewed: I have personally reviewed following labs and imaging studies  CBC: Recent Labs  Lab 10/07/20 1047 10/08/20 0551 10/11/20 0612  WBC 9.4 9.3 11.4*  NEUTROABS  --   --  8.1*  HGB 11.1* 12.3 10.8*  HCT 36.2 39.8 36.4  MCV 74.5* 74.1* 75.1*  PLT 254 270 629    Basic Metabolic Panel: Recent Labs  Lab 10/07/20 1047 10/08/20 0551 10/09/20 0552 10/10/20 0628  NA 137 137 132* 132*  K 3.6 3.4* 3.5 4.3  CL 105 99 98 101  CO2 23 27 26 25   GLUCOSE 127* 182* 144* 164*  BUN 16 22* 26* 30*  CREATININE 0.96 1.49* 1.40* 1.51*  CALCIUM 8.9 9.0 8.7* 8.6*  MG  --   --  1.6* 1.9    GFR: Estimated Creatinine Clearance: 56.5 mL/min (A) (by C-G formula based on SCr of 1.51 mg/dL (H)).  Liver Function Tests: Recent Labs  Lab 10/07/20 1047  AST 17  ALT 14  ALKPHOS 72  BILITOT 1.0  PROT 7.4  ALBUMIN 3.3*    CBG: Recent Labs  Lab 10/10/20 1117 10/10/20 1631 10/10/20 2123 10/11/20 0723 10/11/20 1121  GLUCAP 212*  192* 220* 180* 144*    Recent Results (from the past 240 hour(s))  SARS CORONAVIRUS 2 (TAT 6-24 HRS) Nasopharyngeal Nasopharyngeal Swab     Status: None   Collection Time: 10/07/20 12:03 PM   Specimen: Nasopharyngeal Swab  Result Value Ref Range Status   SARS Coronavirus 2 NEGATIVE NEGATIVE Final    Comment: (NOTE) SARS-CoV-2 target nucleic acids are NOT DETECTED.  The SARS-CoV-2 RNA is generally detectable in upper and lower respiratory specimens during the acute phase of infection. Negative results do not preclude SARS-CoV-2 infection, do not rule out co-infections with other pathogens, and should not be used as the sole basis for treatment or other patient management decisions. Negative results must  be combined with clinical observations, patient history, and epidemiological information. The expected result is Negative.  Fact Sheet for Patients: SugarRoll.be  Fact Sheet for Healthcare Providers: https://www.woods-mathews.com/  This test is not yet approved or cleared by the [place] FDA and  has been authorized for detection and/or diagnosis of SARS-CoV-2 by FDA under an Emergency Use Authorization (EUA). This EUA will remain  in effect (meaning this test can be used) for the duration of the COVID-19 declaration under Se ction 564(b)(1) of the Act, 21 U.S.C. section 360bbb-3(b)(1), unless the authorization is terminated or revoked sooner.  Performed at [facility], [place] [address], [place], [place] [address]      Radiology Studies: CT ABDOMEN PELVIS WO CONTRAST  Result Date: 10/10/2020 CLINICAL DATA:  45 year old female with history of abdominal distension and left lower quadrant abdominal pain. Vomiting and abdominal distension for the past 2 days. EXAM: CT ABDOMEN AND PELVIS WITHOUT CONTRAST TECHNIQUE: Multidetector CT imaging of the abdomen and pelvis was performed following the standard protocol without IV contrast.  COMPARISON:  CT the abdomen and pelvis 07/04/2019. FINDINGS: Lower chest: Linear scarring or subsegmental atelectasis in the left lower lobe. Cardiomegaly. Calcifications of the mitral annulus. Hepatobiliary: No definite suspicious cystic or solid hepatic lesions are confidently identified on today's noncontrast CT examination. Status post cholecystectomy. Pancreas: No definite pancreatic mass or peripancreatic fluid collections or inflammatory changes are noted on today's noncontrast CT examination. Spleen: Unremarkable. Adrenals/Urinary Tract: Unenhanced appearance of the kidneys and bilateral adrenal glands is normal. No hydroureteronephrosis. Urinary bladder is normal in appearance. Stomach/Bowel: Unenhanced appearance of the stomach is normal. No pathologic dilatation of small bowel or colon. A few scattered colonic diverticulae are noted. In the region of the distal descending colon there are surrounding inflammatory changes indicative of acute diverticulitis. No diverticular abscess or signs of frank perforation are noted at this time. Normal appendix. Vascular/Lymphatic: No atherosclerotic calcifications in the abdominal aorta or pelvic vasculature. No lymphadenopathy noted in the abdomen or pelvis. Reproductive: Status post hysterectomy.  Ovaries are atrophic. Other: No significant volume of ascites.  No pneumoperitoneum. Musculoskeletal: There are no aggressive appearing lytic or blastic lesions noted in the visualized portions of the skeleton. IMPRESSION: 1. Findings are compatible with acute diverticulitis of the distal descending colon. No signs of diverticular abscess or frank perforation are noted at this time. 2. Cardiomegaly. 3. Additional incidental findings, as above. Electronically Signed   By: Vinnie Langton M.D.   On: 10/10/2020 14:43   DG CHEST PORT 1 VIEW  Result Date: 10/10/2020 CLINICAL DATA:  CHF exacerbation, diabetes mellitus, hypertension EXAM: PORTABLE CHEST 1 VIEW COMPARISON:   Portable exam 0459 hours compared to 10/08/2020 FINDINGS: Enlargement of cardiac silhouette. Mediastinal contours and pulmonary vascularity normal. Lungs clear. No infiltrate, pleural effusion, or pneumothorax. Osseous structures unremarkable. IMPRESSION: Enlargement of cardiac silhouette. No acute pulmonary edema. Electronically Signed   By: Lavonia Dana M.D.   On: 10/10/2020 08:05   Scheduled Meds: . ARIPiprazole  10 mg Oral Daily  . aspirin EC  81 mg Oral Daily  . carvedilol  50 mg Oral BID WC  . clonazePAM  0.5 mg Oral BID  . gabapentin  800 mg Oral TID  . heparin  5,000 Units Subcutaneous Q8H  . hydrALAZINE  100 mg Oral TID  . insulin glargine  30 Units Subcutaneous QHS  . isosorbide mononitrate  30 mg Oral Daily  . mometasone-formoterol  2 puff Inhalation BID  . potassium chloride SA  40  mEq Oral BID  . pyridostigmine  60 mg Oral Q8H  . sacubitril-valsartan  1 tablet Oral BID  . sodium chloride flush  3 mL Intravenous Q12H  . sodium chloride flush  3 mL Intravenous Q12H  . spironolactone  12.5 mg Oral Daily  . torsemide  40 mg Oral Daily  . traZODone  200 mg Oral QHS   Continuous Infusions: . sodium chloride 250 mL (10/11/20 0313)  . cefTRIAXone (ROCEPHIN)  IV 2 g (10/10/20 1614)  . metronidazole 500 mg (10/11/20 0845)    LOS: 4 days   Time spent: 4 mins  Leenah Seidner Wynetta Emery, MD How to contact the [facility] Attending or Consulting provider [place] or covering provider during after hours [place], for this patient?  1. Check the care team in [facility] and look for a) attending/consulting TRH provider listed and b) the [facility] team listed 2. Log into [email or web address] and use [place]'s universal password to access. If you do not have the password, please contact the hospital operator. 3. Locate the [facility] provider you are looking for under [facility] and page to a number that you can be directly reached. 4. If you still have difficulty reaching the provider, please page the [facility] (Director on  Call) for the Hospitalists listed on amion for assistance.  10/11/2020, 11:52 AM

## 2020-10-12 DIAGNOSIS — I5043 Acute on chronic combined systolic (congestive) and diastolic (congestive) heart failure: Secondary | ICD-10-CM | POA: Diagnosis not present

## 2020-10-12 LAB — CBC WITH DIFFERENTIAL/PLATELET
Abs Immature Granulocytes: 0.02 10*3/uL (ref 0.00–0.07)
Basophils Absolute: 0 10*3/uL (ref 0.0–0.1)
Basophils Relative: 0 %
Eosinophils Absolute: 0.1 10*3/uL (ref 0.0–0.5)
Eosinophils Relative: 1 %
HCT: 37.9 % (ref 36.0–46.0)
Hemoglobin: 11.3 g/dL — ABNORMAL LOW (ref 12.0–15.0)
Immature Granulocytes: 0 %
Lymphocytes Relative: 17 %
Lymphs Abs: 1.8 10*3/uL (ref 0.7–4.0)
MCH: 22.6 pg — ABNORMAL LOW (ref 26.0–34.0)
MCHC: 29.8 g/dL — ABNORMAL LOW (ref 30.0–36.0)
MCV: 76 fL — ABNORMAL LOW (ref 80.0–100.0)
Monocytes Absolute: 0.6 10*3/uL (ref 0.1–1.0)
Monocytes Relative: 6 %
Neutro Abs: 7.7 10*3/uL (ref 1.7–7.7)
Neutrophils Relative %: 76 %
Platelets: 265 10*3/uL (ref 150–400)
RBC: 4.99 MIL/uL (ref 3.87–5.11)
RDW: 15.8 % — ABNORMAL HIGH (ref 11.5–15.5)
WBC: 10.2 10*3/uL (ref 4.0–10.5)
nRBC: 0 % (ref 0.0–0.2)

## 2020-10-12 LAB — GLUCOSE, CAPILLARY
Glucose-Capillary: 161 mg/dL — ABNORMAL HIGH (ref 70–99)
Glucose-Capillary: 211 mg/dL — ABNORMAL HIGH (ref 70–99)
Glucose-Capillary: 238 mg/dL — ABNORMAL HIGH (ref 70–99)
Glucose-Capillary: 154 mg/dL — ABNORMAL HIGH (ref 70–99)

## 2020-10-12 MED ORDER — HYDRALAZINE HCL 25 MG PO TABS
25.0000 mg | ORAL_TABLET | Freq: Three times a day (TID) | ORAL | Status: DC
  Administered 2020-10-12 – 2020-10-13 (×2): 25 mg via ORAL
  Filled 2020-10-12 (×3): qty 1

## 2020-10-12 MED ORDER — CARVEDILOL 12.5 MG PO TABS
25.0000 mg | ORAL_TABLET | Freq: Two times a day (BID) | ORAL | Status: DC
  Administered 2020-10-13: 25 mg via ORAL
  Filled 2020-10-12: qty 2

## 2020-10-12 NOTE — Plan of Care (Signed)

## 2020-10-12 NOTE — Progress Notes (Signed)
PROGRESS NOTE   Chelsey Santiago  TFT:[id number] DOB: 25-Aug-1975 DOA: 10/07/2020 PCP: Rosita Fire, MD   Chief Complaint  Patient presents with  . Chest Pain   Level of care: Telemetry  Brief Admission History:  45 y.o. female with past medical history relevant for obesity/OSA, as well as HTN, DM2, chronic combined systolic and diastolic CHF/NICM(EF 70-62% by echo in 12/2015 with cath showing normal cors, EF at 45% by repeat echo in 10/2018, at 35% in 09/11/2020- presents to the ED with complaints of dyspnea and chest discomfort -No fever  Or chills , no productive cough no pleuritic symptoms -Patient noted some abdominal girth increase, as well as 6 pound weight gain over the last 5 days with orthopnea, dyspnea on exertion despite adjusting torsemide dosage -Patient admits to dietary indiscretion including excessive fluid and salt intake the previous week -Chest x-ray is consistent with CHF -Troponin is 17, repeat troponin 12, EKG with sinus tach and LVH -BNP Up to 2225, BNP was 261 on 09/13/2020 and 388 on 09/12/2020 -CBC with hemoglobin of 11.1 MCV and MCH are low -Creatinine is 0.9 with a BUN of 16, sodium and potassium WNL -She had significant dyspnea at rest and with minimal activity EDP gave 80 mg of IV Lasix patient voided 1.5 L in the ED  Assessment & Plan:   Principal Problem:   Acute on 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  No evidence of perforation or abscess seen.  Continue IV ceftriaxone and metronidazole.  Liquid diet, advance as tolerated to soft diet.  Pt still having significant pain.  Continue IV antibiotic for at least another 24 hours.  Plan to send home on oral augmentin for full 10 day course of  therapy.  Refer to GI for colonoscopy.  Pt continues to have abdominal pain. Will continue IV abx today.  Reassess in AM.    Acute combined systolic diastolic heart failure - Improved but patient reports persistent abdominal edema. Frequent hospitalizations thought due to long history of poor compliance with diet and medication, multiple hospitalizations for same, diuresed well on IV lasix.       I/O documentation has been poor and thought inaccurate what is currently reported.   Restart torsemide.   Filed Weights   10/10/20 0300 10/11/20 0500 10/12/20 0429  Weight: 100.3 kg 101.3 kg 101.2 kg   I/O last 3 completed shifts: In: 862.2 [P.O.:600; I.V.:103; Other:30; IV Piggyback:129.2] Out: -  Total I/O In: 39 [P.O.:480; I.V.:3] Out: -   DM type 2 with neurological complications, uncontrolled - as evidenced by an A1c of 9.8% - continue current mgmt and follow CBG closely.  Compliance remains very poor.  Recommend outpatient referral to diabetes education and nutrition.    Essential hypertension - BPs better controlled, following.   DVT prophylaxis: heparin  Code Status: full  Family Communication: discussed with pt at bedside  Disposition: anticipate return home  Status is: Inpatient  Remains inpatient appropriate because:IV treatments appropriate due to intensity of illness or inability to take PO and Inpatient level of care appropriate due to severity of illness  Dispo: The patient is from: Home              Anticipated d/c is to: Home  Patient currently is not medically stable to d/c.   Difficult to place patient No  Consultants:   n/a  Procedures:   n/a  Antimicrobials:  Ceftriaxone 5/27>> Metronidazole 5/27>>  Subjective: Pt c/o of ongoing LLQ abdominal pain. Pt afraid of going home with this pain.     Objective: Vitals:   10/11/20 2018 10/12/20 0429 10/12/20 0759 10/12/20 1053  BP:  114/66  (!) 111/55  Pulse:  89  84  Resp:  20  18  Temp:  97.7 F  (36.5 C)    TempSrc:  Oral    SpO2: 93% 98% 97% 95%  Weight:  101.2 kg    Height:        Intake/Output Summary (Last 24 hours) at 10/12/2020 1319 Last data filed at 10/12/2020 1259 Gross per 24 hour  Intake 583 ml  Output --  Net 583 ml   Filed Weights   10/10/20 0300 10/11/20 0500 10/12/20 0429  Weight: 100.3 kg 101.3 kg 101.2 kg    Examination:  General exam: MO female, awake, alert, lying in bed, NAD, Appears calm and comfortable  Respiratory system: Clear to auscultation. Respiratory effort normal. Cardiovascular system: normal S1 & S2 heard. No JVD, murmurs, rubs, gallops or clicks. No pedal edema. Gastrointestinal system: Abdomen is nondistended, soft and severe LLQ abdominal pain. Light guarding. No organomegaly or masses felt. Normal bowel sounds heard. Central nervous system: Alert and oriented. No focal neurological deficits. Extremities: trace pretibial edema BLEs.  Symmetric 5 x 5 power. Skin: No rashes, lesions or ulcers Psychiatry: Judgement and insight appear poor. Mood & affect appropriate.   Data Reviewed: I have personally reviewed following labs and imaging studies  CBC: Recent Labs  Lab 10/07/20 1047 10/08/20 0551 10/11/20 0612 10/12/20 0625  WBC 9.4 9.3 11.4* 10.2  NEUTROABS  --   --  8.1* 7.7  HGB 11.1* 12.3 10.8* 11.3*  HCT 36.2 39.8 36.4 37.9  MCV 74.5* 74.1* 75.1* 76.0*  PLT 254 270 235 914    Basic Metabolic Panel: Recent Labs  Lab 10/07/20 1047 10/08/20 0551 10/09/20 0552 10/10/20 0628  NA 137 137 132* 132*  K 3.6 3.4* 3.5 4.3  CL 105 99 98 101  CO2 23 27 26 25   GLUCOSE 127* 182* 144* 164*  BUN 16 22* 26* 30*  CREATININE 0.96 1.49* 1.40* 1.51*  CALCIUM 8.9 9.0 8.7* 8.6*  MG  --   --  1.6* 1.9    GFR: Estimated Creatinine Clearance: 56.5 mL/min (A) (by C-G formula based on SCr of 1.51 mg/dL (H)).  Liver Function Tests: Recent Labs  Lab 10/07/20 1047  AST 17  ALT 14  ALKPHOS 72  BILITOT 1.0  PROT 7.4  ALBUMIN 3.3*     CBG: Recent Labs  Lab 10/11/20 1121 10/11/20 1602 10/11/20 2140 10/12/20 0739 10/12/20 1123  GLUCAP 144* 132* 234* 161* 211*    Recent Results (from the past 240 hour(s))  SARS CORONAVIRUS 2 (TAT 6-24 HRS) Nasopharyngeal Nasopharyngeal Swab     Status: None   Collection Time: 10/07/20 12:03 PM   Specimen: Nasopharyngeal Swab  Result Value Ref Range Status   SARS Coronavirus 2 NEGATIVE NEGATIVE Final    Comment: (NOTE) SARS-CoV-2 target nucleic acids are NOT DETECTED.  The SARS-CoV-2 RNA is generally detectable in upper and lower respiratory specimens during the acute phase of infection. Negative results do not preclude SARS-CoV-2 infection, do not rule out co-infections with other pathogens, and should not be used as the sole basis for  treatment or other patient management decisions. Negative results must be combined with clinical observations, patient history, and epidemiological information. The expected result is Negative.  Fact Sheet for Patients: SugarRoll.be  Fact Sheet for Healthcare Providers: https://www.woods-mathews.com/  This test is not yet approved or cleared by the [place] FDA and  has been authorized for detection and/or diagnosis of SARS-CoV-2 by FDA under an Emergency Use Authorization (EUA). This EUA will remain  in effect (meaning this test can be used) for the duration of the COVID-19 declaration under Se ction 564(b)(1) of the Act, 21 U.S.C. section 360bbb-3(b)(1), unless the authorization is terminated or revoked sooner.  Performed at [facility], [place] [address] [place], [place] [address]      Radiology Studies: No results found. Scheduled Meds: . ARIPiprazole  10 mg Oral Daily  . aspirin EC  81 mg Oral Daily  . carvedilol  50 mg Oral BID WC  . clonazePAM  0.5 mg Oral BID  . gabapentin  800 mg Oral TID  . heparin  5,000 Units Subcutaneous Q8H  . hydrALAZINE  25 mg Oral TID  .  insulin glargine  30 Units Subcutaneous QHS  . isosorbide mononitrate  30 mg Oral Daily  . mometasone-formoterol  2 puff Inhalation BID  . potassium chloride SA  40 mEq Oral BID  . pyridostigmine  60 mg Oral Q8H  . sacubitril-valsartan  1 tablet Oral BID  . sodium chloride flush  3 mL Intravenous Q12H  . sodium chloride flush  3 mL Intravenous Q12H  . spironolactone  12.5 mg Oral Daily  . torsemide  40 mg Oral Daily  . traZODone  200 mg Oral QHS   Continuous Infusions: . sodium chloride 250 mL (10/11/20 0313)  . cefTRIAXone (ROCEPHIN)  IV Stopped (10/11/20 1620)  . metronidazole 500 mg (10/12/20 1106)    LOS: 5 days   Time spent: 35 mins  Dakayla Disanti Wynetta Emery, MD How to contact the [facility] Attending or Consulting provider [place] or covering provider during after hours [place], for this patient?  1. Check the care team in [facility] and look for a) attending/consulting [facility] provider listed and b) the [facility] team listed 2. Log into [email or web address] and use [place]'s universal password to access. If you do not have the password, please contact the hospital operator. 3. Locate the [facility] provider you are looking for under [facility] and page to a number that you can be directly reached. 4. If you still have difficulty reaching the provider, please page the [facility] (Director on Call) for the Hospitalists listed on amion for assistance.  10/12/2020, 1:19 PM

## 2020-10-12 NOTE — Progress Notes (Signed)
Patient very sleepy , awakens does inhaler poorly.

## 2020-10-13 DIAGNOSIS — E669 Obesity, unspecified: Secondary | ICD-10-CM

## 2020-10-13 DIAGNOSIS — I5043 Acute on chronic combined systolic (congestive) and diastolic (congestive) heart failure: Secondary | ICD-10-CM | POA: Diagnosis not present

## 2020-10-13 DIAGNOSIS — D649 Anemia, unspecified: Secondary | ICD-10-CM

## 2020-10-13 DIAGNOSIS — K5792 Diverticulitis of intestine, part unspecified, without perforation or abscess without bleeding: Secondary | ICD-10-CM | POA: Diagnosis not present

## 2020-10-13 LAB — GLUCOSE, CAPILLARY
Glucose-Capillary: 149 mg/dL — ABNORMAL HIGH (ref 70–99)
Glucose-Capillary: 160 mg/dL — ABNORMAL HIGH (ref 70–99)

## 2020-10-13 MED ORDER — OXYCODONE-ACETAMINOPHEN 5-325 MG PO TABS: 1.0000 | ORAL_TABLET | Freq: Four times a day (QID) | ORAL | 0 refills | Status: DC | PRN

## 2020-10-13 MED ORDER — ISOSORBIDE MONONITRATE ER 30 MG PO TB24: 30.0000 mg | ORAL_TABLET | Freq: Every day | ORAL | 1 refills | Status: DC

## 2020-10-13 MED ORDER — HYDRALAZINE HCL 25 MG PO TABS: 25.0000 mg | ORAL_TABLET | Freq: Three times a day (TID) | ORAL | 1 refills | Status: DC

## 2020-10-13 MED ORDER — AMOXICILLIN-POT CLAVULANATE 875-125 MG PO TABS: 1.0000 | ORAL_TABLET | Freq: Two times a day (BID) | ORAL | 0 refills | Status: AC

## 2020-10-13 MED ORDER — CARVEDILOL 25 MG PO TABS: 25.0000 mg | ORAL_TABLET | Freq: Two times a day (BID) | ORAL | 3 refills | Status: DC

## 2020-10-13 MED ORDER — POTASSIUM CHLORIDE CRYS ER 20 MEQ PO TBCR: 20.0000 meq | EXTENDED_RELEASE_TABLET | Freq: Every day | ORAL | 3 refills | Status: DC

## 2020-10-13 MED ORDER — SODIUM CHLORIDE 0.9 % IV SOLN: 2.0000 g | INTRAVENOUS | Status: DC

## 2020-10-13 MED ORDER — LINACLOTIDE 145 MCG PO CAPS: 145.0000 ug | ORAL_CAPSULE | Freq: Every day | ORAL | 5 refills | Status: DC | PRN

## 2020-10-13 NOTE — Discharge Instructions (Signed)
Chronic Constipation Chronic constipation is a condition in which a person has three or fewer bowel movements a week, for 3 months or longer. This condition is especially common in older adults. What are the causes? Causes of chronic constipation may include:  Not drinking enough fluid, eating enough food or fiber, or getting enough physical activity.  Pregnancy.  A tear in the anus (anal fissure).  Blockage in the bowel (bowel obstruction).  Narrowing of the bowel (bowel stricture).  Having a long-term medical condition, such as: ? Diabetes, hypothyroidism, or iron-deficiency anemia. ? Stroke or spinal cord injury. ? Multiple sclerosis or Parkinson's disease. ? Colon cancer. ? Dementia. ? Inflammatory bowel disease (IBD), outward collapse of the rectum (rectal prolapse), or hemorrhoids.  Taking certain medicines, including: ? Narcotics. These are a certain type of prescription pain medicine. ? Antacids or iron supplements. ? Water pills (diuretics). ? Certain blood pressure medicines. ? Anti-seizure medicines. ? Antidepressants. ? Medicines for Parkinson's disease. Other causes of this condition may include:  Stress.  Problems in the nerves and muscles that control the movement of stool.  Weak or impaired pelvic floor muscles.   What increases the risk? You may be at higher risk for chronic constipation if:  You are older than age 87.  You are female.  You live in a long-term care facility.  You have a long-term disease.  You have a mental health disorder or eating disorder. What are the signs or symptoms? The main symptom of chronic constipation is having three or fewer bowel movements a week for several weeks. Other signs and symptoms may vary from person to person. These include:  Pushing hard (straining) to pass stool, or having hard or lumpy stools.  Painful bowel movements.  Having lower abdominal discomfort, such as cramps or bloating.  Being unable  to have a bowel movement when you feel the urge, or feeling like you still need to pass stool after a bowel movement.  Feeling that you have something in your rectum that is blocking or preventing bowel movements.  Seeing blood on the toilet paper or in your stool.  Worsening confusion (in older adults). How is this diagnosed? This condition may be diagnosed based on:  Your symptoms and medical history. You will be asked about your symptoms, lifestyle, diet, and any medicines that you are taking.  A physical exam. ? Your abdomen will be examined. ? A digital rectal exam may be done. For this exam, a health care provider places a lubricated, gloved finger into the rectum.  Tests to check for any underlying causes of your constipation. These may be ordered if you have bleeding in your rectum, weight loss, or a family history of colon cancer. In these cases, you may have: ? Imaging studies of the colon. These may include X-ray, ultrasound, or a CT scan. ? Blood tests. ? A procedure to examine the inside of your colon (colonoscopy). ? More specialized tests to check:  Whether your anal sphincter works well. This is a ring-shaped muscle that controls the closing of the anus.  How well food moves through your colon. ? Tests to measure the nerve signal in your pelvic floor muscles (electromyography). How is this treated? Treatment for chronic constipation depends on the cause. Most often, treatment starts with:  Being more active and getting regular exercise.  Drinking more fluids.  Adding fiber to your diet. Sources of fiber include fruits, vegetables, whole grains, and fiber supplements.  Using medicines such as stool softeners  or medicines that increase contractions in your digestive system (pro-motility agents).  Training your pelvic muscles with biofeedback.  Surgery, if there is obstruction. Treatment may also include:  Stopping or changing some medicines if they cause  constipation.  Using a fiber supplement (bulk laxative) or stool softener.  Using a prescription laxative. This works by [employer] into your colon (osmotic laxative). You may also need to see a specialist who treats conditions of the digestive system (gastroenterologist).   Follow these instructions at home: Medicines  Take over-the-counter and prescription medicines only as told by your health care provider.  If you are taking a laxative, take it as told by your health care provider. Eating and drinking  Eat a balanced diet that includes enough fiber. Ask your health care provider to recommend a diet that is right for you.  Drink clear fluids, especially water. Avoid drinking alcohol, caffeine, and soda. These can make constipation worse.  Drink enough fluid to keep your urine pale yellow.   General instructions  Get some physical activity every day. Ask your health care provider what activities are safe for you.  Get colon cancer screenings as told by your health care provider.  Keep all follow-up visits as told by your health care provider. This is important. Contact a health care provider if you have:  Three or fewer bowel movements a week.  Stools that are hard or lumpy.  Blood on the toilet paper or in your stool after you have a bowel movement.  Unexplained weight loss.  Rectum (rectal) pain.  Stool leakage.  Nausea or vomiting. Get help right away if you have:  Rectal bleeding or you pass blood clots.  Severe rectal pain.  Body tissue that pushes out (protrudes) from your anus.  Severe pain or bloating (distension) in your abdomen.  Vomiting that you cannot control. Summary  Chronic constipation is a condition in which a person has three or fewer bowel movements a week, for 3 months or longer.  You may have a higher risk for this condition if you are an older adult, you are female, or you have a long-term disease.  Treatment for this condition  depends on the cause. Most treatments for chronic constipation include adding fiber to your diet, drinking more fluids, and getting more physical activity. You may also need to treat any underlying medical conditions or stop or change certain medicines if they cause constipation.  If lifestyle changes do not relieve constipation, your health care provider may recommend taking a laxative. This information is not intended to replace advice given to you by your health care provider. Make sure you discuss any questions you have with your health care provider. Document Revised: 03/21/2019 Document Reviewed: 03/21/2019 Elsevier Patient Education  2021 [place].   Heart Failure, Diagnosis  Heart failure means that your heart is not able to pump blood in the right way. This makes it hard for your body to work well. Heart failure is usually a long-term (chronic) condition. You must take good care of yourself and follow your treatment plan from your doctor. What are the causes?  High blood pressure.  Buildup of cholesterol and fat in the arteries.  Heart attack. This injures the heart muscle.  Heart valves that do not open and close properly.  Damage of the heart muscle. This is also called cardiomyopathy.  Infection of the heart muscle. This is also called myocarditis.  Lung disease. What increases the risk?  Getting older. The risk of  heart failure goes up as a person ages.  Being overweight.  Being female.  Use tobacco or nicotine products.  Abusing alcohol or drugs.  Having taken medicines that can damage the heart.  Having any of these conditions: ? Diabetes. ? Abnormal heart rhythms. ? Thyroid problems. ? Low blood counts (anemia).  Having a family history of heart failure. What are the signs or symptoms?  Shortness of breath.  Coughing.  Swelling of the feet, ankles, legs, or belly.  Losing or gaining weight for no reason.  Trouble breathing.  Waking from  sleep because of the need to sit up and get more air.  Fast heartbeat.  Being very tired.  Feeling dizzy, or feeling like you may pass out (faint).  Having no desire to eat.  Feeling like you may vomit (nauseous).  Peeing (urinating) more at night.  Feeling confused. How is this treated? This condition may be treated with:  Medicines. These can be given to treat blood pressure and to make the heart muscles stronger.  Changes in your daily life. These may include: ? Eating a healthy diet. ? Staying at a healthy body weight. ? Quitting tobacco, alcohol, and drug use. ? Doing exercises. ? Participating in a cardiac rehabilitation program. This program helps you improve your health through exercise, education, and counseling.  Surgery. Surgery can be done to open blocked valves, or to put devices in the heart, such as pacemakers.  A donor heart (heart transplant). You will receive a healthy heart from a donor. Follow these instructions at home:  Treat other conditions as told by your doctor. These may include high blood pressure, diabetes, thyroid disease, or abnormal heart rhythms.  Learn as much as you can about heart failure.  Get support as you need it.  Keep all follow-up visits. Summary  Heart failure means that your heart is not able to pump blood in the right way.  This condition is often caused by high blood pressure, heart attack, or damage of the heart muscle.  Symptoms of this condition include shortness of breath and swelling of the feet, ankles, legs, or belly. You may also feel very tired or feel like you may vomit.  You may be treated with medicines, surgery, or changes in your daily life.  Treat other health conditions as told by your doctor. This information is not intended to replace advice given to you by your health care provider. Make sure you discuss any questions you have with your health care provider. Document Revised: 11/24/2019 Document  Reviewed: 11/24/2019 Elsevier Patient Education  2021 [place].   Heart Failure Medicines  Heart failure is a condition in which the heart cannot pump enough blood through the body. This can cause shortness of breath, coughing, fatigue, and confusion. Swelling in the feet, ankles, and legs is also common. Medicines for heart failure can strengthen the heart's ability to pump blood and decrease the work it has to do. There is no cure for heart failure. However, taking medicines as directed and living a healthy lifestyle can help you stay active, avoid problems, and live longer. Talk with your health care provider about all medicines that you are taking, how often you should take them, and possible side effects.  Tell a health care provider about:  Any allergies you have.  All medicines you are taking, including vitamins, herbs, eye drops, creams, and over-the-counter medicines.  Any blood disorders you have.  Any other medical conditions you have.  Whether you are  pregnant or may be pregnant. Types of medicines The medicines that are prescribed for you will depend on your symptoms, the type of heart failure you have, and the cause of your heart failure. In most cases, you may need to take more than one medicine. Angiotensin-converting enzyme (ACE) inhibitors, angiotensin receptor blockers (ARBs), and angiotensin receptor neprilysin inhibitor (ARNIs)  These medicines help to dilate arteries and veins. This makes it easier for your heart to pump by lowering blood pressure and reducing the strain on your heart. These medicines can help to lessen the symptoms of heart failure.  Common ACE inhibitors include lisinopril and ramipril. Common ARBs include losartan and valsartan. Common ARNIs include sacubitril with valsartan. ? Only take one of these medicines. Do not combine ACE inhibitors, ARBs, or ARNIs. The risk of side effects increases if more than one of these medicines is taken at the  same time.  Side effects include dry cough, dizziness, low blood pressure, high potassium levels, and kidney problems. You may need regular checkups and blood tests to monitor how the medicine is working.  Do not take these medicines if you are pregnant or may become pregnant. They can cause serious birth defects.  In rare cases, these medicines can cause a serious side effect called angioedema. Symptoms include swelling of the tongue, lips, mouth, or throat, trouble breathing, and hoarseness. Stomach pain or swelling and vomiting can also happen. ? If you have any of these symptoms, stop taking the medicine and contact your health care provider right away. If you have had angioedema in the past, talk with your health care provider before starting one of these medicines. Beta-blockers  These medicines slow your heart rate. This helps to improve the heart's ability to pump and lessen its workload.  Common beta-blockers for heart failure are bisoprolol, carvedilol, and long-acting metoprolol (extended-release).  Some beta-blockers can worsen lung diseases that cause wheezing, such as asthma. Talk with your health care provider before taking a beta-blocker if you have a lung disease.  These medicines can hide the symptoms of low blood sugar (glucose), also called hypoglycemia. If you have diabetes, check your blood glucose carefully. If you have hypoglycemia, talk with your health care provider about adjusting your medicines.  This medicine may make you feel dizzy or light-headed at first. Do not drive or use machinery when you first start these medicines. Ask your health care provider when it is safe for you to do this.  Because these medicines slow your heart rate, it is important not to overdo it with exercise. Talk with your health care provider about what your target heart rate should be while you exercise.  Do not stop this medicine suddenly unless your health care provider says to do this.  Stopping this medicine suddenly can increase the risk of a heart attack or heart rhythm problems. If it is decided that stopping this medicine is right for you, your dose will be lowered slowly to prevent these side effects. Diuretics  Diuretics help the body get rid of excess sodium and water by increasing the need to urinate more often. This helps to lessen the amount of blood that the heart needs to pump. They also reduce fluid buildup in the lungs, ankles, and feet.  Common diuretics include furosemide, bumetanide, and hydrochlorothiazide. The amount of water or fluid removed from the body depends on which diuretic is used.  You may be asked to weigh yourself to determine if you have too much or too little  fluid. Ask your health care provider what your weight should be and when to contact your health care provider if it changes. Also, ask about how much fluid you should be drinking each day.  These medicines can worsen problems with controlling urination (urinary incontinence). Talk to your health care provider about the best time of day to take this medicine if you have trouble with bladder control.  Side effects include dizziness, headaches, muscle cramps, and an upset stomach.  These medicines can cause dehydration. Symptoms include tiredness, increased thirst, confusion, and decreased urination. Contact your health care provider right away if you think you might be dehydrated.  These medicines can cause your electrolyte levels (magnesium and potassium) and kidney function to change. These levels will be monitored by your health care provider.  These medicines can make you more sensitive to the sun. Keep out of the sun. If you cannot avoid being in the sun, wear protective clothing and use sunscreen. Do not use sun lamps or tanning beds. Aldosterone antagonists  These medicines are a type of diuretic. They get rid of excess sodium and water from the body by increasing the need to urinate.  This helps to lessen the amount of blood that the heart needs to pump.  Common aldosterone antagonists include spironolactone and eplerenone.  Side effects include dizziness, low blood pressure, increased potassium levels, and kidney problems. You may need regular checkups and blood tests to monitor how this medicine is working.  These medicines can raise the amount of potassium in the blood. Your potassium levels will be monitored regularly by your health care provider.  Spironolactone and eplerenone may increase breast size in males and may cause breast tenderness in all patients. Digoxin  Digoxin helps the heart pump blood better. It also lowers your heart rate. It is usually added to other medicines if you are still having heart failure symptoms.  Your health care provider will monitor your digoxin levels with blood tests. Too much digoxin can cause serious problems, such as an irregular heart rhythm.  Early signs of digoxin toxicity include nausea, vomiting, loss of appetite, headaches, confusion, weakness, or vision changes, such as blurred vision or seeing more yellow or green than normal. Contact your health care provider right away if you have any of these signs. Vasodilators  Hydralazine and nitrates relax the blood vessels and lower blood pressure. This helps your heart pump blood better.  Hydralazine and nitrates are usually given with other medicines to control your heart failure symptoms. You may be given one or both of these medicines, depending on your symptoms.  Side effects include headaches, a flushed face or neck, dizziness, and low blood pressure. If channel blockers  Ivabradine is an If channel blocker. It lowers the heart rate and decreases the heart's workload.  This medicine is usually given with beta-blockers if you have symptoms of heart failure and your heart rate is more than 70 beats per minute while taking a beta-blocker.  Side effects include high blood  pressure, brightness in your field of vision, slower than normal heart rate (bradycardia), and heart rhythm problems.  Grapefruit juice may increase the risk of side effects. It is recommended to avoid this.  Do not take this medicine if you are pregnant or may become pregnant. It can cause serious birth defects. Sodium-glucose cotransporter-2 inhibitors (SGLT-2 inhibitors)  SGLT-2 inhibitors are medicines used to treat type 2 diabetes. They have been shown to improve symptoms and quality of life in people with heart  failure, even if they do not have diabetes. They also decrease the rate of hospitalizations and death.  Common SGLT-2 inhibitors include canagliflozin and dapagliflozin.  Side effects include an increase in urinary tract infections, genital fungal infections, increased urination, and kidney problems.  If you have diabetes, there is a small risk of hypoglycemia. There is also a small risk of diabetic ketoacidosis. This happens when your blood sugar is very high and usually requires treatment in the hospital. Summary  When you have heart failure, taking medicines as directed and living a healthy lifestyle can help you stay active, avoid problems, and live longer.  In most cases, you will need to take more than one medicine.  It is important to talk with your health care provider about how often you should take your medicines. Do not skip a dose or change your dosage.  Talk to your health care provider about possible side effects of these medicines. This information is not intended to replace advice given to you by your health care provider. Make sure you discuss any questions you have with your health care provider. Document Revised: 01/14/2020 Document Reviewed: 01/14/2020 Elsevier Patient Education  2021 [employer]    IMPORTANT INFORMATION: PAY CLOSE ATTENTION   PHYSICIAN DISCHARGE INSTRUCTIONS  Follow with Primary care provider  Rosita Fire, MD  and other  consultants as instructed by your Hospitalist Physician  [place] [facility] IF SYMPTOMS COME BACK, WORSEN OR NEW PROBLEM DEVELOPS   Please note: You were cared for by a hospitalist during your hospital stay. Every effort will be made to forward records to your primary care provider.  You can request that your primary care provider send for your hospital records if they have not received them.  Once you are discharged, your primary care physician will handle any further medical issues. Please note that NO REFILLS for any discharge medications will be authorized once you are discharged, as it is imperative that you return to your primary care physician (or establish a relationship with a primary care physician if you do not have one) for your post hospital discharge needs so that they can reassess your need for medications and monitor your lab values.  Please get a complete blood count and chemistry panel checked by your Primary MD at your next visit, and again as instructed by your Primary MD.  Get Medicines reviewed and adjusted: Please take all your medications with you for your next visit with your Primary MD  Laboratory/radiological data: Please request your Primary MD to go over all hospital tests and procedure/radiological results at the follow up, please ask your primary care provider to get all Hospital records sent to his/her office.  In some cases, they will be blood work, cultures and biopsy results pending at the time of your discharge. Please request that your primary care provider follow up on these results.  If you are diabetic, please bring your blood sugar readings with you to your follow up appointment with primary care.    Please call and make your follow up appointments as soon as possible.    Also Note the following: If you experience worsening of your admission symptoms, develop shortness of breath, life threatening emergency, suicidal or  homicidal thoughts you must seek medical attention immediately by calling 911 or calling your MD immediately  if symptoms less severe.  You must read complete instructions/literature along with all the possible adverse reactions/side effects for all the Medicines you take and  that have been prescribed to you. Take any new Medicines after you have completely understood and accpet all the possible adverse reactions/side effects.   Do not drive when taking Pain medications or sleeping medications (Benzodiazepines)  Do not take more than prescribed Pain, Sleep and Anxiety Medications. It is not advisable to combine anxiety,sleep and pain medications without talking with your primary care practitioner  Special Instructions: If you have smoked or chewed Tobacco  in the last 2 yrs please stop smoking, stop any regular Alcohol  and or any Recreational drug use.  Wear Seat belts while driving.  Do not drive if taking any narcotic, mind altering or controlled substances or recreational drugs or alcohol.

## 2020-10-13 NOTE — Plan of Care (Signed)

## 2020-10-13 NOTE — Progress Notes (Signed)
Refused CPAP for tonight.

## 2020-10-13 NOTE — Progress Notes (Signed)
Nsg Discharge Note  Admit Date:  10/07/2020 Discharge date: 10/13/2020   Nilda Keathley to be D/C'd home per MD order.  AVS completed.  Copy for chart, and copy for patient signed, and dated. Patient/caregiver able to verbalize understanding.  Discharge Medication: Allergies as of 10/13/2020      Reactions   Diclofenac Swelling   AND POSSIBLE SYNCOPE; tolerates ibuprofen per pt   Tramadol Nausea And Vomiting, Nausea Only   Itching (12/21); tolerates ibuprofen per pt   Vicodin [hydrocodone-acetaminophen] Itching, Nausea Only      Medication List    STOP taking these medications   amLODipine 10 MG tablet Commonly known as: NORVASC     TAKE these medications   Accu-Chek Guide test strip Generic drug: glucose blood USE AS DIRECTED TO TESTCBLOOD SUGAR TWICE DAILY.   albuterol 108 (90 Base) MCG/ACT inhaler Commonly known as: VENTOLIN HFA Inhale 1 puff into the lungs every 6 (six) hours as needed for wheezing or shortness of breath.   amoxicillin-clavulanate 875-125 MG tablet Commonly known as: Augmentin Take 1 tablet by mouth 2 (two) times daily for 7 days.   ARIPiprazole 10 MG tablet Commonly known as: ABILIFY Take 1 tablet (10 mg total) by mouth daily.   aspirin EC 81 MG tablet Take 81 mg by mouth daily.   budesonide-formoterol 80-4.5 MCG/ACT inhaler Commonly known as: Symbicort Inhale 2 puffs into the lungs 2 (two) times daily.   carvedilol 25 MG tablet Commonly known as: COREG Take 1 tablet (25 mg total) by mouth 2 (two) times daily with a meal. What changed: how much to take   clonazePAM 0.5 MG tablet Commonly known as: KLONOPIN Take 0.5 mg by mouth 2 (two) times daily.   Entresto 97-103 MG Generic drug: sacubitril-valsartan Take 1 tablet by mouth 2 (two) times daily.   gabapentin 800 MG tablet Commonly known as: NEURONTIN Take 800 mg by mouth 3 (three) times daily.   hydrALAZINE 25 MG tablet Commonly known as: APRESOLINE Take 1 tablet (25 mg total) by  mouth 3 (three) times daily. What changed:   medication strength  how much to take   insulin glargine 100 UNIT/ML injection Commonly known as: LANTUS Inject 30 Units at bedtime into the skin.   insulin lispro 100 UNIT/ML injection Commonly known as: HUMALOG Inject 10-16 Units into the skin 3 (three) times daily before meals.   Ipratropium-Albuterol 20-100 MCG/ACT Aers respimat Commonly known as: Combivent Respimat Inhale 1 puff into the lungs every 6 (six) hours as needed for wheezing or shortness of breath.   isosorbide mononitrate 30 MG 24 hr tablet Commonly known as: IMDUR Take 1 tablet (30 mg total) by mouth daily. Start taking on: Oct 14, 2020   linaclotide 145 MCG Caps capsule Commonly known as: Linzess Take 1 capsule (145 mcg total) by mouth daily as needed (constipation). What changed:   when to take this  reasons to take this   liraglutide 18 MG/3ML Sopn Commonly known as: VICTOZA Inject 1.2 mg into the skin daily.   nitroGLYCERIN 0.4 MG SL tablet Commonly known as: Nitrostat Place 1 tablet (0.4 mg total) under the tongue every 5 (five) minutes as needed for chest pain.   ondansetron 8 MG disintegrating tablet Commonly known as: Zofran ODT Take 1 tablet (8 mg total) by mouth every 8 (eight) hours as needed for nausea or vomiting.   oxyCODONE-acetaminophen 5-325 MG tablet Commonly known as: PERCOCET/ROXICET Take 1 tablet by mouth every 6 (six) hours as needed for up to  5 days for severe pain.   potassium chloride SA 20 MEQ tablet Commonly known as: Klor-Con M20 Take 1 tablet (20 mEq total) by mouth daily. What changed: how much to take   pyridostigmine 60 MG tablet Commonly known as: MESTINON Take 60 mg by mouth every 8 (eight) hours.   spironolactone 25 MG tablet Commonly known as: ALDACTONE Take 0.5 tablets (12.5 mg total) by mouth daily.   torsemide 20 MG tablet Commonly known as: DEMADEX Take 2 tablets (40 mg total) by mouth 2 (two) times  daily.   traZODone 100 MG tablet Commonly known as: DESYREL Take 2 tablets (200 mg total) by mouth at bedtime.            Durable Medical Equipment  (From admission, onward)         Start     Ordered   10/08/20 1345  For home use only DME 3 n 1  Once        10/08/20 1344          Discharge Assessment: Vitals:   10/13/20 0714 10/13/20 1255  BP:  (!) 93/59  Pulse:  81  Resp:  16  Temp:  (!) 97.4 F (36.3 C)  SpO2: 95% 98%   Skin clean, dry and intact without evidence of skin break down, no evidence of skin tears noted. IV catheter discontinued intact. Site without signs and symptoms of complications - no redness or edema noted at insertion site, patient denies c/o pain - only slight tenderness at site.  Dressing with slight pressure applied.  D/c Instructions-Education: Discharge instructions given to patient/family with verbalized understanding. D/c education completed with patient/family including follow up instructions, medication list, d/c activities limitations if indicated, with other d/c instructions as indicated by MD - patient able to verbalize understanding, all questions fully answered. Patient instructed to return to ED, call 911, or call MD for any changes in condition.  Patient escorted via [place], and D/C home via private auto.  Zachery Conch, RN 10/13/2020 2:52 PM

## 2020-10-13 NOTE — Discharge Summary (Addendum)
Physician Discharge Summary  Chelsey Santiago FOY:[id number] DOB: [birth year] DOA: 10/07/2020  PCP: Rosita Fire, MD  Admit date: 10/07/2020 Discharge date: 10/13/2020  Admitted From:  Home  Disposition: Home   Recommendations for Outpatient Follow-up:  1. Follow up with PCP in 1 weeks 2. Follow up with cardiology in 2 weeks  3. Please obtain BMP in 1-2 weeks 4. Please follow up with GI to schedule colonoscopy   Home Health: Outpatient PT   Discharge Condition: STABLE   CODE STATUS: FULL DIET: Heart healthy / carb modified    Brief Hospitalization Summary: Please see all hospital notes, images, labs for full details of the hospitalization. ADMISSION HPI:  Chelsey Santiago  is a 45 y.o. female with past medical history relevant for obesity/OSA, as well as HTN, DM2, chronic combined systolic and diastolic CHF/NICM(EF 76-72% by echo in 12/2015 with cath showing normal cors, EF at 45% by repeat echo in 10/2018, at 35% in 09/11/2020- presents to the ED with complaints of dyspnea and chest discomfort -No fever  Or chills , no productive cough no pleuritic symptoms -Patient noted some abdominal girth increase, as well as 6 pound weight gain over the last 5 days with orthopnea, dyspnea on exertion despite adjusting torsemide dosage -Patient admits to dietary indiscretion including excessive fluid and salt intake the previous week -Chest x-ray is consistent with CHF -Troponin is 17, repeat troponin 12, EKG with sinus tach and LVH -BNP Up to 2225, BNP was 261 on 09/13/2020 and 388 on 09/12/2020 -CBC with hemoglobin of 11.1 MCV and MCH are low -Creatinine is 0.9 with a BUN of 16, sodium and potassium WNL -She had significant dyspnea at rest and with minimal activity EDP gave 80 mg of IV Lasix patient voided 1.5 L in the ED  Hospital Course  Acute diverticulitis - CT abdomen confirms.  No evidence of perforation or abscess seen.  Pt was treated with IV ceftriaxone and metronidazole.  Liquid diet,  advanced to soft diet. Pt reports improvement in pain.  DC home on oral augmentin to complete full 10 day course of therapy.  Refer to GI for colonoscopy.    Acute combined systolic diastolic heart failure - Improved but patient reports persistent abdominal edema. Frequent hospitalizations thought due to long history of poor compliance with diet and medication, multiple hospitalizations for same, diuresed well on IV lasix.       I/O documentation has been poor and thought inaccurate what is currently reported.   Continue home torsemide 40 mg BID.   DM type 2 with neurological complications, uncontrolled - as evidenced by an A1c of 9.8% - continue current mgmt and follow CBG closely.  Compliance remains very poor.  Recommend outpatient referral to diabetes education and nutrition.    Essential hypertension - BPs better controlled, following.  We reduced her toprol to 25mg  BID due to soft BPs, we reduced hydralazine to 25 mg TID due to soft BPs.  Dr. [name] started her on imdur 30 mg daily.    DVT prophylaxis: heparin  Code Status: full  Family Communication: discussed with pt at bedside  Disposition: anticipate return home  Status is: Inpatient  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 (HCC) Active Problems:   OSA on CPAP   Hypertension   Uncontrolled type 2 diabetes mellitus with hyperglycemia (HCC)   Class 2 obesity   Chronic anemia   Acute diverticulitis   Discharge Instructions: Discharge Instructions  Ambulatory referral to Cardiology   Complete by: As directed    Hospital Follow Up - 2 weeks recommended   Ambulatory referral to Physical Therapy   Complete by: As directed      Allergies as of 10/13/2020      Reactions   Diclofenac Swelling   AND POSSIBLE SYNCOPE; tolerates ibuprofen per pt   Tramadol Nausea And Vomiting, Nausea Only   Itching (12/21); tolerates ibuprofen per  pt   Vicodin [hydrocodone-acetaminophen] Itching, Nausea Only      Medication List    STOP taking these medications   amLODipine 10 MG tablet Commonly known as: NORVASC     TAKE these medications   Accu-Chek Guide test strip Generic drug: glucose blood USE AS DIRECTED TO TESTCBLOOD SUGAR TWICE DAILY.   albuterol 108 (90 Base) MCG/ACT inhaler Commonly known as: VENTOLIN HFA Inhale 1 puff into the lungs every 6 (six) hours as needed for wheezing or shortness of breath.   amoxicillin-clavulanate 875-125 MG tablet Commonly known as: Augmentin Take 1 tablet by mouth 2 (two) times daily for 7 days.   ARIPiprazole 10 MG tablet Commonly known as: ABILIFY Take 1 tablet (10 mg total) by mouth daily.   aspirin EC 81 MG tablet Take 81 mg by mouth daily.   budesonide-formoterol 80-4.5 MCG/ACT inhaler Commonly known as: Symbicort Inhale 2 puffs into the lungs 2 (two) times daily.   carvedilol 25 MG tablet Commonly known as: COREG Take 1 tablet (25 mg total) by mouth 2 (two) times daily with a meal. What changed: how much to take   clonazePAM 0.5 MG tablet Commonly known as: KLONOPIN Take 0.5 mg by mouth 2 (two) times daily.   Entresto 97-103 MG Generic drug: sacubitril-valsartan Take 1 tablet by mouth 2 (two) times daily.   gabapentin 800 MG tablet Commonly known as: NEURONTIN Take 800 mg by mouth 3 (three) times daily.   hydrALAZINE 25 MG tablet Commonly known as: APRESOLINE Take 1 tablet (25 mg total) by mouth 3 (three) times daily. What changed:   medication strength  how much to take   insulin glargine 100 UNIT/ML injection Commonly known as: LANTUS Inject 30 Units at bedtime into the skin.   insulin lispro 100 UNIT/ML injection Commonly known as: HUMALOG Inject 10-16 Units into the skin 3 (three) times daily before meals.   Ipratropium-Albuterol 20-100 MCG/ACT Aers respimat Commonly known as: Combivent Respimat Inhale 1 puff into the lungs every 6 (six)  hours as needed for wheezing or shortness of breath.   isosorbide mononitrate 30 MG 24 hr tablet Commonly known as: IMDUR Take 1 tablet (30 mg total) by mouth daily. Start taking on: Oct 14, 2020   linaclotide 145 MCG Caps capsule Commonly known as: Linzess Take 1 capsule (145 mcg total) by mouth daily as needed (constipation). What changed:   when to take this  reasons to take this   liraglutide 18 MG/3ML Sopn Commonly known as: VICTOZA Inject 1.2 mg into the skin daily.   nitroGLYCERIN 0.4 MG SL tablet Commonly known as: Nitrostat Place 1 tablet (0.4 mg total) under the tongue every 5 (five) minutes as needed for chest pain.   ondansetron 8 MG disintegrating tablet Commonly known as: Zofran ODT Take 1 tablet (8 mg total) by mouth every 8 (eight) hours as needed for nausea or vomiting.   oxyCODONE-acetaminophen 5-325 MG tablet Commonly known as: PERCOCET/ROXICET Take 1 tablet by mouth every 6 (six) hours as needed for up to 5 days for severe pain.  potassium chloride SA 20 MEQ tablet Commonly known as: Klor-Con M20 Take 1 tablet (20 mEq total) by mouth daily. What changed: how much to take   pyridostigmine 60 MG tablet Commonly known as: MESTINON Take 60 mg by mouth every 8 (eight) hours.   spironolactone 25 MG tablet Commonly known as: ALDACTONE Take 0.5 tablets (12.5 mg total) by mouth daily.   torsemide 20 MG tablet Commonly known as: DEMADEX Take 2 tablets (40 mg total) by mouth 2 (two) times daily.   traZODone 100 MG tablet Commonly known as: DESYREL Take 2 tablets (200 mg total) by mouth at bedtime.            Durable Medical Equipment  (From admission, onward)         Start     Ordered   10/08/20 1345  For home use only DME 3 n 1  Once        10/08/20 1344          Follow-up Information    [facility] Follow up.   Why:  They will ship a 3N1       Rosita Fire, MD. Schedule an appointment as soon as possible for a visit in 1  week(s).   Specialty: Internal Medicine Contact information: [place] [address] [phone]        Arnoldo Lenis, MD. Schedule an appointment as soon as possible for a visit in 2 week(s).   Specialty: Cardiology Contact information: [place] [address] [phone]              Allergies  Allergen Reactions  . Diclofenac Swelling    AND POSSIBLE SYNCOPE; tolerates ibuprofen per pt  . Tramadol Nausea And Vomiting and Nausea Only    Itching (12/21); tolerates ibuprofen per pt  . Vicodin [Hydrocodone-Acetaminophen] Itching and Nausea Only   Allergies as of 10/13/2020      Reactions   Diclofenac Swelling   AND POSSIBLE SYNCOPE; tolerates ibuprofen per pt   Tramadol Nausea And Vomiting, Nausea Only   Itching (12/21); tolerates ibuprofen per pt   Vicodin [hydrocodone-acetaminophen] Itching, Nausea Only      Medication List    STOP taking these medications   amLODipine 10 MG tablet Commonly known as: NORVASC     TAKE these medications   Accu-Chek Guide test strip Generic drug: glucose blood USE AS DIRECTED TO TESTCBLOOD SUGAR TWICE DAILY.   albuterol 108 (90 Base) MCG/ACT inhaler Commonly known as: VENTOLIN HFA Inhale 1 puff into the lungs every 6 (six) hours as needed for wheezing or shortness of breath.   amoxicillin-clavulanate 875-125 MG tablet Commonly known as: Augmentin Take 1 tablet by mouth 2 (two) times daily for 7 days.   ARIPiprazole 10 MG tablet Commonly known as: ABILIFY Take 1 tablet (10 mg total) by mouth daily.   aspirin EC 81 MG tablet Take 81 mg by mouth daily.   budesonide-formoterol 80-4.5 MCG/ACT inhaler Commonly known as: Symbicort Inhale 2 puffs into the lungs 2 (two) times daily.   carvedilol 25 MG tablet Commonly known as: COREG Take 1 tablet (25 mg total) by mouth 2 (two) times daily with a meal. What changed: how much to take   clonazePAM 0.5 MG tablet Commonly known as:  KLONOPIN Take 0.5 mg by mouth 2 (two) times daily.   Entresto 97-103 MG Generic drug: sacubitril-valsartan Take 1 tablet by mouth 2 (two) times daily.   gabapentin 800 MG tablet Commonly known  as: NEURONTIN Take 800 mg by mouth 3 (three) times daily.   hydrALAZINE 25 MG tablet Commonly known as: APRESOLINE Take 1 tablet (25 mg total) by mouth 3 (three) times daily. What changed:   medication strength  how much to take   insulin glargine 100 UNIT/ML injection Commonly known as: LANTUS Inject 30 Units at bedtime into the skin.   insulin lispro 100 UNIT/ML injection Commonly known as: HUMALOG Inject 10-16 Units into the skin 3 (three) times daily before meals.   Ipratropium-Albuterol 20-100 MCG/ACT Aers respimat Commonly known as: Combivent Respimat Inhale 1 puff into the lungs every 6 (six) hours as needed for wheezing or shortness of breath.   isosorbide mononitrate 30 MG 24 hr tablet Commonly known as: IMDUR Take 1 tablet (30 mg total) by mouth daily. Start taking on: Oct 14, 2020   linaclotide 145 MCG Caps capsule Commonly known as: Linzess Take 1 capsule (145 mcg total) by mouth daily as needed (constipation). What changed:   when to take this  reasons to take this   liraglutide 18 MG/3ML Sopn Commonly known as: VICTOZA Inject 1.2 mg into the skin daily.   nitroGLYCERIN 0.4 MG SL tablet Commonly known as: Nitrostat Place 1 tablet (0.4 mg total) under the tongue every 5 (five) minutes as needed for chest pain.   ondansetron 8 MG disintegrating tablet Commonly known as: Zofran ODT Take 1 tablet (8 mg total) by mouth every 8 (eight) hours as needed for nausea or vomiting.   oxyCODONE-acetaminophen 5-325 MG tablet Commonly known as: PERCOCET/ROXICET Take 1 tablet by mouth every 6 (six) hours as needed for up to 5 days for severe pain.   potassium chloride SA 20 MEQ tablet Commonly known as: Klor-Con M20 Take 1 tablet (20 mEq total) by mouth daily. What  changed: how much to take   pyridostigmine 60 MG tablet Commonly known as: MESTINON Take 60 mg by mouth every 8 (eight) hours.   spironolactone 25 MG tablet Commonly known as: ALDACTONE Take 0.5 tablets (12.5 mg total) by mouth daily.   torsemide 20 MG tablet Commonly known as: DEMADEX Take 2 tablets (40 mg total) by mouth 2 (two) times daily.   traZODone 100 MG tablet Commonly known as: DESYREL Take 2 tablets (200 mg total) by mouth at bedtime.            Durable Medical Equipment  (From admission, onward)         Start     Ordered   10/08/20 1345  For home use only DME 3 n 1  Once        10/08/20 1344          Procedures/Studies: CT ABDOMEN PELVIS WO CONTRAST  Result Date: 10/10/2020 CLINICAL DATA:  45 year old female with history of abdominal distension and left lower quadrant abdominal pain. Vomiting and abdominal distension for the past 2 days. EXAM: CT ABDOMEN AND PELVIS WITHOUT CONTRAST TECHNIQUE: Multidetector CT imaging of the abdomen and pelvis was performed following the standard protocol without IV contrast. COMPARISON:  CT the abdomen and pelvis 07/04/2019. FINDINGS: Lower chest: Linear scarring or subsegmental atelectasis in the left lower lobe. Cardiomegaly. Calcifications of the mitral annulus. Hepatobiliary: No definite suspicious cystic or solid hepatic lesions are confidently identified on today's noncontrast CT examination. Status post cholecystectomy. Pancreas: No definite pancreatic mass or peripancreatic fluid collections or inflammatory changes are noted on today's noncontrast CT examination. Spleen: Unremarkable. Adrenals/Urinary Tract: Unenhanced appearance of the kidneys and bilateral adrenal glands is normal.  No hydroureteronephrosis. Urinary bladder is normal in appearance. Stomach/Bowel: Unenhanced appearance of the stomach is normal. No pathologic dilatation of small bowel or colon. A few scattered colonic diverticulae are noted. In the region of  the distal descending colon there are surrounding inflammatory changes indicative of acute diverticulitis. No diverticular abscess or signs of frank perforation are noted at this time. Normal appendix. Vascular/Lymphatic: No atherosclerotic calcifications in the abdominal aorta or pelvic vasculature. No lymphadenopathy noted in the abdomen or pelvis. Reproductive: Status post hysterectomy.  Ovaries are atrophic. Other: No significant volume of ascites.  No pneumoperitoneum. Musculoskeletal: There are no aggressive appearing lytic or blastic lesions noted in the visualized portions of the skeleton. IMPRESSION: 1. Findings are compatible with acute diverticulitis of the distal descending colon. No signs of diverticular abscess or frank perforation are noted at this time. 2. Cardiomegaly. 3. Additional incidental findings, as above. Electronically Signed   By: Vinnie Langton M.D.   On: 10/10/2020 14:43   DG Chest 2 View  Result Date: 10/08/2020 CLINICAL DATA:  Shortness of breath. EXAM: CHEST - 2 VIEW COMPARISON:  10/07/2020. FINDINGS: Cardiomegaly again noted. Interim near complete clearing of bilateral interstitial prominence suggesting clearing CHF. No pleural effusion or pneumothorax. IMPRESSION: Cardiomegaly again noted. Interim near complete clearing of bilateral interstitial prominence suggesting clearing CHF. Electronically Signed   By: Marcello Moores  Register   On: 10/08/2020 06:00   DG Chest 2 View  Result Date: 10/07/2020 CLINICAL DATA:  Chest pain EXAM: CHEST - 2 VIEW COMPARISON:  09/10/2020 FINDINGS: Cardiomegaly. Vascular pedicle widening. Diffuse interstitial opacity with Kerley lines. No effusion or consolidation. Cholecystectomy clips. IMPRESSION: CHF. Electronically Signed   By: Monte Fantasia M.D.   On: 10/07/2020 11:06   DG CHEST PORT 1 VIEW  Result Date: 10/10/2020 CLINICAL DATA:  CHF exacerbation, diabetes mellitus, hypertension EXAM: PORTABLE CHEST 1 VIEW COMPARISON:  Portable exam 0459  hours compared to 10/08/2020 FINDINGS: Enlargement of cardiac silhouette. Mediastinal contours and pulmonary vascularity normal. Lungs clear. No infiltrate, pleural effusion, or pneumothorax. Osseous structures unremarkable. IMPRESSION: Enlargement of cardiac silhouette. No acute pulmonary edema. Electronically Signed   By: Lavonia Dana M.D.   On: 10/10/2020 08:05     Subjective: Pt reports abdominal pain is much better and she is tolerating her diet very well. No complaints.    Discharge Exam: Vitals:   10/13/20 0714 10/13/20 1255  BP:  (!) 93/59  Pulse:  81  Resp:  16  Temp:  (!) 97.4 F (36.3 C)  SpO2: 95% 98%   Vitals:   10/12/20 2206 10/13/20 0510 10/13/20 0714 10/13/20 1255  BP: 119/75 112/82  (!) 93/59  Pulse: 72 76  81  Resp: 19 18  16   Temp: 97.7 F (36.5 C) 97.8 F (36.6 C)  (!) 97.4 F (36.3 C)  TempSrc: Oral Oral  Oral  SpO2: 99% 98% 95% 98%  Weight:  101.7 kg    Height:       General: Pt is alert, awake, not in acute distress Cardiovascular: RRR, S1/S2 +, no rubs, no gallops Respiratory: CTA bilaterally, no wheezing, no rhonchi Abdominal: Soft, NT, ND, bowel sounds + Extremities: no edema, no cyanosis   The results of significant diagnostics from this hospitalization (including imaging, microbiology, ancillary and laboratory) are listed below for reference.     Microbiology: Recent Results (from the past 240 hour(s))  SARS CORONAVIRUS 2 (TAT 6-24 HRS) Nasopharyngeal Nasopharyngeal Swab     Status: None   Collection Time: 10/07/20 12:03 PM  Specimen: Nasopharyngeal Swab  Result Value Ref Range Status   SARS Coronavirus 2 NEGATIVE NEGATIVE Final    Comment: (NOTE) SARS-CoV-2 target nucleic acids are NOT DETECTED.  The SARS-CoV-2 RNA is generally detectable in upper and lower respiratory specimens during the acute phase of infection. Negative results do not preclude SARS-CoV-2 infection, do not rule out co-infections with other pathogens, and should not  be used as the sole basis for treatment or other patient management decisions. Negative results must be combined with clinical observations, patient history, and epidemiological information. The expected result is Negative.  Fact Sheet for Patients: SugarRoll.be  Fact Sheet for Healthcare Providers: https://www.woods-mathews.com/  This test is not yet approved or cleared by the [place] FDA and  has been authorized for detection and/or diagnosis of SARS-CoV-2 by FDA under an Emergency Use Authorization (EUA). This EUA will remain  in effect (meaning this test can be used) for the duration of the COVID-19 declaration under Se ction 564(b)(1) of the Act, 21 U.S.C. section 360bbb-3(b)(1), unless the authorization is terminated or revoked sooner.  Performed at [facility], [place] [address] [place], [place] [address]      Labs: BNP (last 3 results) Recent Labs    09/13/20 0454 10/07/20 1047 10/09/20 0552  BNP 261.0* 2,225.0* 527.7*   Basic Metabolic Panel: Recent Labs  Lab 10/07/20 1047 10/08/20 0551 10/09/20 0552 10/10/20 0628  NA 137 137 132* 132*  K 3.6 3.4* 3.5 4.3  CL 105 99 98 101  CO2 23 27 26 25   GLUCOSE 127* 182* 144* 164*  BUN 16 22* 26* 30*  CREATININE 0.96 1.49* 1.40* 1.51*  CALCIUM 8.9 9.0 8.7* 8.6*  MG  --   --  1.6* 1.9   Liver Function Tests: Recent Labs  Lab 10/07/20 1047  AST 17  ALT 14  ALKPHOS 72  BILITOT 1.0  PROT 7.4  ALBUMIN 3.3*   No results for input(s): LIPASE, AMYLASE in the last 168 hours. No results for input(s): AMMONIA in the last 168 hours. CBC: Recent Labs  Lab 10/07/20 1047 10/08/20 0551 10/11/20 0612 10/12/20 0625  WBC 9.4 9.3 11.4* 10.2  NEUTROABS  --   --  8.1* 7.7  HGB 11.1* 12.3 10.8* 11.3*  HCT 36.2 39.8 36.4 37.9  MCV 74.5* 74.1* 75.1* 76.0*  PLT 254 270 235 265   Cardiac Enzymes: No results for input(s): CKTOTAL, CKMB, CKMBINDEX, TROPONINI in the last  168 hours. BNP: Invalid input(s): POCBNP CBG: Recent Labs  Lab 10/12/20 0739 10/12/20 1123 10/12/20 1633 10/12/20 2203 10/13/20 1114  GLUCAP 161* 211* 238* 154* 160*   D-Dimer No results for input(s): DDIMER in the last 72 hours. Hgb A1c No results for input(s): HGBA1C in the last 72 hours. Lipid Profile No results for input(s): CHOL, HDL, LDLCALC, TRIG, CHOLHDL, LDLDIRECT in the last 72 hours. Thyroid function studies No results for input(s): TSH, T4TOTAL, T3FREE, THYROIDAB in the last 72 hours.  Invalid input(s): FREET3 Anemia work up No results for input(s): VITAMINB12, FOLATE, FERRITIN, TIBC, IRON, RETICCTPCT in the last 72 hours. Urinalysis    Component Value Date/Time   COLORURINE YELLOW 08/28/2019 1226   APPEARANCEUR CLEAR 08/28/2019 1226   LABSPEC 1.028 08/28/2019 1226   PHURINE 6.0 08/28/2019 1226   GLUCOSEU >=500 (A) 08/28/2019 1226   HGBUR NEGATIVE 08/28/2019 1226   BILIRUBINUR NEGATIVE 08/28/2019 1226   KETONESUR NEGATIVE 08/28/2019 1226   PROTEINUR Negative 12/19/2019 1010   PROTEINUR NEGATIVE 08/28/2019 1226   UROBILINOGEN 0.2 04/29/2014  1115   NITRITE n 12/19/2019 1010   NITRITE NEGATIVE 08/28/2019 1226   LEUKOCYTESUR Negative 12/19/2019 1010   LEUKOCYTESUR TRACE (A) 08/28/2019 1226   Sepsis Labs Invalid input(s): PROCALCITONIN,  WBC,  LACTICIDVEN Microbiology Recent Results (from the past 240 hour(s))  SARS CORONAVIRUS 2 (TAT 6-24 HRS) Nasopharyngeal Nasopharyngeal Swab     Status: None   Collection Time: 10/07/20 12:03 PM   Specimen: Nasopharyngeal Swab  Result Value Ref Range Status   SARS Coronavirus 2 NEGATIVE NEGATIVE Final    Comment: (NOTE) SARS-CoV-2 target nucleic acids are NOT DETECTED.  The SARS-CoV-2 RNA is generally detectable in upper and lower respiratory specimens during the acute phase of infection. Negative results do not preclude SARS-CoV-2 infection, do not rule out co-infections with other pathogens, and should not be  used as the sole basis for treatment or other patient management decisions. Negative results must be combined with clinical observations, patient history, and epidemiological information. The expected result is Negative.  Fact Sheet for Patients: SugarRoll.be  Fact Sheet for Healthcare Providers: https://www.woods-mathews.com/  This test is not yet approved or cleared by the [place] FDA and  has been authorized for detection and/or diagnosis of SARS-CoV-2 by FDA under an Emergency Use Authorization (EUA). This EUA will remain  in effect (meaning this test can be used) for the duration of the COVID-19 declaration under Se ction 564(b)(1) of the Act, 21 U.S.C. section 360bbb-3(b)(1), unless the authorization is terminated or revoked sooner.  Performed at [facility], [place] [address], [place], [place] [address]    Time coordinating discharge: 40 mins   SIGNED:  Irwin Brakeman, MD  [facility] 10/13/2020, 1:06 PM How to contact the [facility] Attending or Consulting provider [address] or covering provider during after hours [place], for this patient?  1. Check the care team in [facility] and look for a) attending/consulting [facility] provider listed and b) the [facility] team listed 2. Log into [email or web address] and use [place] universal password to access. If you do not have the password, please contact the hospital operator. 3. Locate the [facility] provider you are looking for under [facility] and page to a number that you can be directly reached. 4. If you still have difficulty reaching the provider, please page the [facility] (Director on Call) for the Hospitalists listed on amion for assistance.

## 2020-10-17 ENCOUNTER — Ambulatory Visit (HOSPITAL_COMMUNITY): Payer: Medicaid Other | Admitting: Physical Therapy

## 2020-10-18 ENCOUNTER — Emergency Department (HOSPITAL_COMMUNITY): Payer: Medicaid Other

## 2020-10-18 ENCOUNTER — Observation Stay (HOSPITAL_COMMUNITY)
Admission: EM | Admit: 2020-10-18 | Discharge: 2020-10-20 | Disposition: A | Discharge status: Home / Self Care | Payer: Medicaid Other | Attending: Internal Medicine | Admitting: Internal Medicine

## 2020-10-18 ENCOUNTER — Encounter (HOSPITAL_COMMUNITY): Payer: Self-pay | Admitting: Emergency Medicine

## 2020-10-18 ENCOUNTER — Other Ambulatory Visit: Payer: Self-pay

## 2020-10-18 DIAGNOSIS — E1165 Type 2 diabetes mellitus with hyperglycemia: Secondary | ICD-10-CM | POA: Diagnosis not present

## 2020-10-18 DIAGNOSIS — R1110 Vomiting, unspecified: Secondary | ICD-10-CM | POA: Diagnosis not present

## 2020-10-18 DIAGNOSIS — F329 Major depressive disorder, single episode, unspecified: Secondary | ICD-10-CM | POA: Diagnosis present

## 2020-10-18 DIAGNOSIS — I5043 Acute on chronic combined systolic (congestive) and diastolic (congestive) heart failure: Secondary | ICD-10-CM | POA: Diagnosis not present

## 2020-10-18 DIAGNOSIS — Z79899 Other long term (current) drug therapy: Secondary | ICD-10-CM | POA: Insufficient documentation

## 2020-10-18 DIAGNOSIS — I110 Hypertensive heart disease with heart failure: Secondary | ICD-10-CM | POA: Diagnosis not present

## 2020-10-18 DIAGNOSIS — Z7982 Long term (current) use of aspirin: Secondary | ICD-10-CM | POA: Diagnosis not present

## 2020-10-18 DIAGNOSIS — G4733 Obstructive sleep apnea (adult) (pediatric): Secondary | ICD-10-CM

## 2020-10-18 DIAGNOSIS — I10 Essential (primary) hypertension: Secondary | ICD-10-CM | POA: Diagnosis not present

## 2020-10-18 DIAGNOSIS — E118 Type 2 diabetes mellitus with unspecified complications: Secondary | ICD-10-CM | POA: Diagnosis not present

## 2020-10-18 DIAGNOSIS — Z794 Long term (current) use of insulin: Secondary | ICD-10-CM | POA: Insufficient documentation

## 2020-10-18 DIAGNOSIS — I160 Hypertensive urgency: Secondary | ICD-10-CM | POA: Diagnosis not present

## 2020-10-18 DIAGNOSIS — Z8616 Personal history of COVID-19: Secondary | ICD-10-CM | POA: Insufficient documentation

## 2020-10-18 DIAGNOSIS — Z20822 Contact with and (suspected) exposure to covid-19: Secondary | ICD-10-CM | POA: Insufficient documentation

## 2020-10-18 DIAGNOSIS — R112 Nausea with vomiting, unspecified: Secondary | ICD-10-CM

## 2020-10-18 DIAGNOSIS — I5042 Chronic combined systolic (congestive) and diastolic (congestive) heart failure: Secondary | ICD-10-CM | POA: Diagnosis not present

## 2020-10-18 DIAGNOSIS — R079 Chest pain, unspecified: Secondary | ICD-10-CM | POA: Diagnosis present

## 2020-10-18 LAB — COMPREHENSIVE METABOLIC PANEL
ALT: 37 U/L (ref 0–44)
AST: 28 U/L (ref 15–41)
Albumin: 3.4 g/dL — ABNORMAL LOW (ref 3.5–5.0)
Alkaline Phosphatase: 122 U/L (ref 38–126)
Anion gap: 10 (ref 5–15)
BUN: 28 mg/dL — ABNORMAL HIGH (ref 6–20)
CO2: 26 mmol/L (ref 22–32)
Calcium: 9 mg/dL (ref 8.9–10.3)
Chloride: 99 mmol/L (ref 98–111)
Creatinine, Ser: 1.35 mg/dL — ABNORMAL HIGH (ref 0.44–1.00)
GFR, Estimated: 49 mL/min — ABNORMAL LOW (ref >60)
Glucose, Bld: 211 mg/dL — ABNORMAL HIGH (ref 70–99)
Potassium: 3.6 mmol/L (ref 3.5–5.1)
Sodium: 135 mmol/L (ref 135–145)
Total Bilirubin: 0.5 mg/dL (ref 0.3–1.2)
Total Protein: 7.8 g/dL (ref 6.5–8.1)

## 2020-10-18 LAB — CBC WITH DIFFERENTIAL/PLATELET
Abs Immature Granulocytes: 0.05 10*3/uL (ref 0.00–0.07)
Basophils Absolute: 0.1 10*3/uL (ref 0.0–0.1)
Basophils Relative: 1 %
Eosinophils Absolute: 0.1 10*3/uL (ref 0.0–0.5)
Eosinophils Relative: 2 %
HCT: 34.3 % — ABNORMAL LOW (ref 36.0–46.0)
Hemoglobin: 10.6 g/dL — ABNORMAL LOW (ref 12.0–15.0)
Immature Granulocytes: 1 %
Lymphocytes Relative: 20 %
Lymphs Abs: 1.6 10*3/uL (ref 0.7–4.0)
MCH: 22.6 pg — ABNORMAL LOW (ref 26.0–34.0)
MCHC: 30.9 g/dL (ref 30.0–36.0)
MCV: 73 fL — ABNORMAL LOW (ref 80.0–100.0)
Monocytes Absolute: 0.5 10*3/uL (ref 0.1–1.0)
Monocytes Relative: 6 %
Neutro Abs: 5.7 10*3/uL (ref 1.7–7.7)
Neutrophils Relative %: 70 %
Platelets: 295 10*3/uL (ref 150–400)
RBC: 4.7 MIL/uL (ref 3.87–5.11)
RDW: 14.9 % (ref 11.5–15.5)
WBC: 8.1 10*3/uL (ref 4.0–10.5)
nRBC: 0 % (ref 0.0–0.2)

## 2020-10-18 LAB — BRAIN NATRIURETIC PEPTIDE: B Natriuretic Peptide: 3754 pg/mL — ABNORMAL HIGH (ref 0.0–100.0)

## 2020-10-18 LAB — GLUCOSE, CAPILLARY: Glucose-Capillary: 186 mg/dL — ABNORMAL HIGH (ref 70–99)

## 2020-10-18 LAB — LIPASE, BLOOD: Lipase: 27 U/L (ref 11–51)

## 2020-10-18 LAB — RESP PANEL BY RT-PCR (FLU A&B, COVID) ARPGX2
Influenza A by PCR: NEGATIVE
Influenza B by PCR: NEGATIVE
SARS Coronavirus 2 by RT PCR: NEGATIVE

## 2020-10-18 LAB — TROPONIN I (HIGH SENSITIVITY)
Troponin I (High Sensitivity): 17 ng/L (ref <18)
Troponin I (High Sensitivity): 16 ng/L (ref <18)

## 2020-10-18 LAB — MAGNESIUM: Magnesium: 1.7 mg/dL (ref 1.7–2.4)

## 2020-10-18 MED ORDER — HYDRALAZINE HCL 25 MG PO TABS
25.0000 mg | ORAL_TABLET | Freq: Three times a day (TID) | ORAL | Status: DC
  Administered 2020-10-18 – 2020-10-20 (×6): 25 mg via ORAL
  Filled 2020-10-18 (×6): qty 1

## 2020-10-18 MED ORDER — MOMETASONE FURO-FORMOTEROL FUM 100-5 MCG/ACT IN AERO
2.0000 | INHALATION_SPRAY | Freq: Two times a day (BID) | RESPIRATORY_TRACT | Status: DC
  Administered 2020-10-19 – 2020-10-20 (×3): 2 via RESPIRATORY_TRACT
  Filled 2020-10-18: qty 8.8

## 2020-10-18 MED ORDER — ENOXAPARIN SODIUM 40 MG/0.4ML IJ SOSY
40.0000 mg | PREFILLED_SYRINGE | INTRAMUSCULAR | Status: DC
  Administered 2020-10-19 – 2020-10-20 (×2): 40 mg via SUBCUTANEOUS
  Filled 2020-10-18 (×2): qty 0.4

## 2020-10-18 MED ORDER — ACETAMINOPHEN 650 MG RE SUPP: 650.0000 mg | Freq: Four times a day (QID) | RECTAL | Status: DC | PRN

## 2020-10-18 MED ORDER — POTASSIUM CHLORIDE CRYS ER 20 MEQ PO TBCR
20.0000 meq | EXTENDED_RELEASE_TABLET | Freq: Every day | ORAL | Status: DC
  Administered 2020-10-19 – 2020-10-20 (×2): 20 meq via ORAL
  Filled 2020-10-18 (×3): qty 1

## 2020-10-18 MED ORDER — FUROSEMIDE 10 MG/ML IJ SOLN
60.0000 mg | Freq: Two times a day (BID) | INTRAMUSCULAR | Status: DC
  Administered 2020-10-18 – 2020-10-19 (×2): 60 mg via INTRAVENOUS
  Filled 2020-10-18 (×2): qty 6

## 2020-10-18 MED ORDER — OXYCODONE-ACETAMINOPHEN 5-325 MG PO TABS
1.0000 | ORAL_TABLET | Freq: Four times a day (QID) | ORAL | Status: DC | PRN
  Administered 2020-10-19 – 2020-10-20 (×2): 1 via ORAL
  Filled 2020-10-18 (×2): qty 1

## 2020-10-18 MED ORDER — ONDANSETRON HCL 4 MG/2ML IJ SOLN
INTRAMUSCULAR | Status: AC
  Filled 2020-10-18: qty 2

## 2020-10-18 MED ORDER — ISOSORBIDE MONONITRATE ER 60 MG PO TB24
30.0000 mg | ORAL_TABLET | Freq: Every day | ORAL | Status: DC
  Administered 2020-10-18 – 2020-10-19 (×2): 30 mg via ORAL
  Filled 2020-10-18 (×2): qty 1

## 2020-10-18 MED ORDER — SODIUM CHLORIDE 0.9 % IV BOLUS
250.0000 mL | Freq: Once | INTRAVENOUS | Status: AC
  Administered 2020-10-18: 250 mL via INTRAVENOUS

## 2020-10-18 MED ORDER — HYDRALAZINE HCL 20 MG/ML IJ SOLN
10.0000 mg | Freq: Once | INTRAMUSCULAR | Status: AC
  Administered 2020-10-18: 10 mg via INTRAVENOUS
  Filled 2020-10-18: qty 1

## 2020-10-18 MED ORDER — CLONAZEPAM 0.5 MG PO TABS
0.5000 mg | ORAL_TABLET | Freq: Two times a day (BID) | ORAL | Status: DC
  Administered 2020-10-18 – 2020-10-20 (×4): 0.5 mg via ORAL
  Filled 2020-10-18 (×4): qty 1

## 2020-10-18 MED ORDER — ONDANSETRON HCL 4 MG PO TABS: 4.0000 mg | ORAL_TABLET | Freq: Four times a day (QID) | ORAL | Status: DC | PRN

## 2020-10-18 MED ORDER — ARIPIPRAZOLE 10 MG PO TABS
10.0000 mg | ORAL_TABLET | Freq: Every day | ORAL | Status: DC
  Administered 2020-10-19 – 2020-10-20 (×2): 10 mg via ORAL
  Filled 2020-10-18 (×2): qty 1

## 2020-10-18 MED ORDER — FUROSEMIDE 10 MG/ML IJ SOLN
40.0000 mg | Freq: Two times a day (BID) | INTRAMUSCULAR | Status: DC
  Filled 2020-10-18: qty 4

## 2020-10-18 MED ORDER — IPRATROPIUM-ALBUTEROL 0.5-2.5 (3) MG/3ML IN SOLN: 3.0000 mL | RESPIRATORY_TRACT | Status: DC | PRN

## 2020-10-18 MED ORDER — INSULIN ASPART 100 UNIT/ML IJ SOLN
0.0000 [IU] | Freq: Three times a day (TID) | INTRAMUSCULAR | Status: DC
  Administered 2020-10-19 (×3): 2 [IU] via SUBCUTANEOUS
  Administered 2020-10-20: 3 [IU] via SUBCUTANEOUS

## 2020-10-18 MED ORDER — SACUBITRIL-VALSARTAN 97-103 MG PO TABS
1.0000 | ORAL_TABLET | Freq: Two times a day (BID) | ORAL | Status: DC
  Administered 2020-10-19 – 2020-10-20 (×3): 1 via ORAL
  Filled 2020-10-18 (×7): qty 1

## 2020-10-18 MED ORDER — OXYCODONE-ACETAMINOPHEN 5-325 MG PO TABS
1.0000 | ORAL_TABLET | Freq: Once | ORAL | Status: AC
Start: 2020-10-18 — End: 2020-10-18
  Administered 2020-10-18: 1 via ORAL
  Filled 2020-10-18: qty 1

## 2020-10-18 MED ORDER — INSULIN ASPART 100 UNIT/ML IJ SOLN: 0.0000 [IU] | Freq: Every day | INTRAMUSCULAR | Status: DC

## 2020-10-18 MED ORDER — SPIRONOLACTONE 12.5 MG HALF TABLET
12.5000 mg | ORAL_TABLET | Freq: Every day | ORAL | Status: DC
  Administered 2020-10-19 – 2020-10-20 (×2): 12.5 mg via ORAL
  Filled 2020-10-18 (×4): qty 1

## 2020-10-18 MED ORDER — LABETALOL HCL 5 MG/ML IV SOLN: 10.0000 mg | INTRAVENOUS | Status: DC | PRN

## 2020-10-18 MED ORDER — CARVEDILOL 12.5 MG PO TABS
25.0000 mg | ORAL_TABLET | Freq: Two times a day (BID) | ORAL | Status: DC
  Administered 2020-10-18 – 2020-10-20 (×4): 25 mg via ORAL
  Filled 2020-10-18 (×4): qty 2

## 2020-10-18 MED ORDER — AMOXICILLIN-POT CLAVULANATE 875-125 MG PO TABS
1.0000 | ORAL_TABLET | Freq: Two times a day (BID) | ORAL | Status: DC
  Administered 2020-10-18 – 2020-10-20 (×4): 1 via ORAL
  Filled 2020-10-18 (×4): qty 1

## 2020-10-18 MED ORDER — GABAPENTIN 400 MG PO CAPS
800.0000 mg | ORAL_CAPSULE | Freq: Three times a day (TID) | ORAL | Status: DC
  Administered 2020-10-18 – 2020-10-20 (×6): 800 mg via ORAL
  Filled 2020-10-18 (×6): qty 2

## 2020-10-18 MED ORDER — TRAZODONE HCL 50 MG PO TABS
200.0000 mg | ORAL_TABLET | Freq: Every day | ORAL | Status: DC
  Administered 2020-10-18 – 2020-10-19 (×2): 200 mg via ORAL
  Filled 2020-10-18 (×2): qty 4

## 2020-10-18 MED ORDER — ACETAMINOPHEN 325 MG PO TABS: 650.0000 mg | ORAL_TABLET | Freq: Four times a day (QID) | ORAL | Status: DC | PRN

## 2020-10-18 MED ORDER — ONDANSETRON HCL 4 MG/2ML IJ SOLN
4.0000 mg | Freq: Four times a day (QID) | INTRAMUSCULAR | Status: DC | PRN
  Administered 2020-10-19: 4 mg via INTRAVENOUS
  Filled 2020-10-18: qty 2

## 2020-10-18 MED ORDER — ONDANSETRON HCL 4 MG/2ML IJ SOLN
4.0000 mg | Freq: Once | INTRAMUSCULAR | Status: AC
  Administered 2020-10-18: 4 mg via INTRAVENOUS

## 2020-10-18 MED ORDER — PYRIDOSTIGMINE BROMIDE 60 MG PO TABS
60.0000 mg | ORAL_TABLET | Freq: Three times a day (TID) | ORAL | Status: DC
  Administered 2020-10-18 – 2020-10-20 (×6): 60 mg via ORAL
  Filled 2020-10-18 (×6): qty 1

## 2020-10-18 MED ORDER — ASPIRIN EC 81 MG PO TBEC
81.0000 mg | DELAYED_RELEASE_TABLET | Freq: Every day | ORAL | Status: DC
  Administered 2020-10-19 – 2020-10-20 (×2): 81 mg via ORAL
  Filled 2020-10-18 (×2): qty 1

## 2020-10-18 MED ORDER — POTASSIUM CHLORIDE CRYS ER 20 MEQ PO TBCR
40.0000 meq | EXTENDED_RELEASE_TABLET | Freq: Once | ORAL | Status: AC
  Administered 2020-10-18: 40 meq via ORAL
  Filled 2020-10-18: qty 2

## 2020-10-18 MED ORDER — INSULIN GLARGINE 100 UNIT/ML ~~LOC~~ SOLN
30.0000 [IU] | Freq: Every day | SUBCUTANEOUS | Status: DC
  Administered 2020-10-18 – 2020-10-19 (×2): 30 [IU] via SUBCUTANEOUS
  Filled 2020-10-18 (×3): qty 0.3

## 2020-10-18 NOTE — H&P (Addendum)
History and Physical    Rebel Laughridge XBD:[id number] DOB: [birth year] DOA: 10/18/2020  PCP: Rosita Fire, MD   Patient coming from: Home  I have personally briefly reviewed patient's old medical records in [place]  Chief Complaint: Vomiting1  HPI: Chelsey Santiago is a 45 y.o. female with medical history significant for systolic and diastolic CHF, diabetes mellitus, depression hypertension obstructive sleep apnea. Patient presented to the ED with complaints of vomiting of 2 days duration, twice yesterday and twice today.  She reports 3 loose stools today, after she took [place].  No abdominal pain, but reports abdominal distention and bloating, 2 pound weight gain.  She reports difficulty breathing over the past 2 days, at rest and with exertion.  No chest pain.  She reports compliance with her medications.  Patient was recently hospitalized 5/24-5/30, treated for acute diverticulitis and decompensated CHF.  She did well until yesterday.  Compliance with her Augmentin which was prescribed on discharge.  ED Course: Blood pressure elevated up to 209/137 improved to 166/105 after IV hydralazine.  Temperature 98.2.  BNP markedly elevated at 3754.  Troponin normal 17.  Lipase normal 27.  WBC 8.1.  Abdominal CT-decreased inflammatory changes of diverticulitis, no abscess. 250 mill bolus given, hydralazine 10 mg x 2 given with improvement in blood pressure.  Review of Systems: As per HPI all other systems reviewed and negative.  Past Medical History:  Diagnosis Date  . Anemia    H&H of 10.6/33 and 07/2008 and 11.9/35 and 09/2010  . Anxiety   . Chronic combined systolic and diastolic CHF (congestive heart failure) (HCC)    a. EF 40-45% by echo in 12/2015 with cath showing normal cors b. EF at 45% by repeat echo in 10/2018 c. EF at 30-35% in 03/2020  . Depression with anxiety   . Diabetes mellitus without complication ([place])   . Hypertension   . Hypertensive heart disease 2009    Pulmonary edema postpartum; mild to moderate mitral regurgitation when hospitalized for CHF in 2009; Echocardiogram in 12/2009-no MR and normal EF; normal CXR in 09/2010  . Migraine headache   . Miscarriage 03/19/2013  . Obesity 04/16/2009  . Osteoarthritis, knee 03/29/2011  . Preeclampsia   . Pulmonary edema   . Sleep apnea   . Threatened abortion in early pregnancy 03/15/2013    Past Surgical History:  Procedure Laterality Date  . BREAST REDUCTION SURGERY  2002  . CARDIAC CATHETERIZATION N/A 12/22/2015   Procedure: Left Heart Cath and Coronary Angiography;  Surgeon: Peter M Martinique, MD;  Location: [facility];  Service: Cardiovascular;  Laterality: N/A;  . CESAREAN SECTION N/A 04/09/2014   Procedure: CESAREAN SECTION;  Surgeon: Mora Bellman, MD;  Location: [facility];  Service: Obstetrics;  Laterality: N/A;  . CHOLECYSTECTOMY    . HYSTERECTOMY ABDOMINAL WITH SALPINGECTOMY N/A 09/26/2019   Procedure: HYSTERECTOMY ABDOMINAL WITH SALPINGECTOMY;  Surgeon: Florian Buff, MD;  Location: [facility];  Service: Gynecology;  Laterality: N/A;  . LIPOMA EXCISION Right 09/26/2019   Procedure: EXCISION LIPOMA RIGHT VULVAR;  Surgeon: Florian Buff, MD;  Location: [facility];  Service: Gynecology;  Laterality: Right;     reports that she has never smoked. She has never used smokeless tobacco. She reports current alcohol use. She reports that she does not use drugs.  Allergies  Allergen Reactions  . Diclofenac Swelling    AND POSSIBLE SYNCOPE; tolerates ibuprofen per pt  . Tramadol Nausea And Vomiting and Nausea Only    Itching (12/21);  tolerates ibuprofen per pt  . Vicodin [Hydrocodone-Acetaminophen] Itching and Nausea Only    Family History  Problem Relation Age of Onset  . Diabetes Mother 57  . Heart disease Mother   . Hyperlipidemia Paternal Grandfather   . Hypertension Paternal Grandfather   . Heart disease Father   . Hypertension Father   . Heart disease Maternal Grandmother 60  . ADD /  ADHD Son   . Hypertension Maternal Uncle   . Heart attack Brother   . Sudden death Neg Hx   . Colon cancer Neg Hx   . Celiac disease Neg Hx   . Inflammatory bowel disease Neg Hx     Prior to Admission medications   Medication Sig Start Date End Date Taking? Authorizing Provider  ACCU-CHEK GUIDE test strip USE AS DIRECTED TO TESTCBLOOD SUGAR TWICE DAILY. 06/11/20   [provider]  albuterol (VENTOLIN HFA) 108 (90 Base) MCG/ACT inhaler Inhale 1 puff into the lungs every 6 (six) hours as needed for wheezing or shortness of breath. 03/30/20   Kathie Dike, MD  amoxicillin-clavulanate (AUGMENTIN) 875-125 MG tablet Take 1 tablet by mouth 2 (two) times daily for 7 days. 10/13/20 10/20/20  Johnson, Clanford L, MD  ARIPiprazole (ABILIFY) 10 MG tablet Take 1 tablet (10 mg total) by mouth daily. 10/30/18   Barton Dubois, MD  aspirin EC 81 MG tablet Take 81 mg by mouth daily.    [provider]  budesonide-formoterol (SYMBICORT) 80-4.5 MCG/ACT inhaler Inhale 2 puffs into the lungs 2 (two) times daily. 03/30/20   Kathie Dike, MD  carvedilol (COREG) 25 MG tablet Take 1 tablet (25 mg total) by mouth 2 (two) times daily with a meal. 10/13/20   Johnson, Clanford L, MD  clonazePAM (KLONOPIN) 0.5 MG tablet Take 0.5 mg by mouth 2 (two) times daily. 12/19/18   [provider]  gabapentin (NEURONTIN) 800 MG tablet Take 800 mg by mouth 3 (three) times daily. 09/01/20   [provider]  hydrALAZINE (APRESOLINE) 25 MG tablet Take 1 tablet (25 mg total) by mouth 3 (three) times daily. 10/13/20   Johnson, Clanford L, MD  insulin glargine (LANTUS) 100 UNIT/ML injection Inject 30 Units at bedtime into the skin.     [provider]  insulin lispro (HUMALOG) 100 UNIT/ML injection Inject 10-16 Units into the skin 3 (three) times daily before meals.    [provider]  Ipratropium-Albuterol (COMBIVENT RESPIMAT) 20-100 MCG/ACT AERS respimat Inhale 1 puff into the lungs every  6 (six) hours as needed for wheezing or shortness of breath. 05/04/18   Manuella Ghazi, Pratik D, DO  isosorbide mononitrate (IMDUR) 30 MG 24 hr tablet Take 1 tablet (30 mg total) by mouth daily. 10/14/20   Johnson, Clanford L, MD  linaclotide (LINZESS) 145 MCG CAPS capsule Take 1 capsule (145 mcg total) by mouth daily as needed (constipation). 10/13/20   Johnson, Clanford L, MD  liraglutide (VICTOZA) 18 MG/3ML SOPN Inject 1.2 mg into the skin daily. 09/04/20   Brita Romp, NP  nitroGLYCERIN (NITROSTAT) 0.4 MG SL tablet Place 1 tablet (0.4 mg total) under the tongue every 5 (five) minutes as needed for chest pain. 09/05/15   Kathie Dike, MD  ondansetron (ZOFRAN ODT) 8 MG disintegrating tablet Take 1 tablet (8 mg total) by mouth every 8 (eight) hours as needed for nausea or vomiting. 09/06/19   Long, Wonda Olds, MD  oxyCODONE-acetaminophen (PERCOCET/ROXICET) 5-325 MG tablet Take 1 tablet by mouth every 6 (six) hours as needed for up to  5 days for severe pain. 10/13/20 10/18/20  Johnson, Clanford L, MD  potassium chloride SA (KLOR-CON M20) 20 MEQ tablet Take 1 tablet (20 mEq total) by mouth daily. 10/13/20 01/11/21  Johnson, Clanford L, MD  pyridostigmine (MESTINON) 60 MG tablet Take 60 mg by mouth every 8 (eight) hours.    [provider]  sacubitril-valsartan (ENTRESTO) 97-103 MG Take 1 tablet by mouth 2 (two) times daily. 03/16/19   Arnoldo Lenis, MD  spironolactone (ALDACTONE) 25 MG tablet Take 0.5 tablets (12.5 mg total) by mouth daily. 09/14/20   Johnson, Clanford L, MD  torsemide (DEMADEX) 20 MG tablet Take 2 tablets (40 mg total) by mouth 2 (two) times daily. 09/05/20   Verta Ellen., NP  traZODone (DESYREL) 100 MG tablet Take 2 tablets (200 mg total) by mouth at bedtime. 10/29/18   Barton Dubois, MD    Physical Exam: Vitals:   10/18/20 1930 10/18/20 2000 10/18/20 2001 10/18/20 2015  BP: (!) 208/124 (!) 190/115 (!) 190/115 (!) 182/96  Pulse: 80 88  93  Resp: (!) 24 (!) 29  (!) 29   Temp:    97.8 F (36.6 C)  TempSrc:    Oral  SpO2: 99% 95%  96%  Weight:      Height:        Constitutional: NAD, calm, comfortable Vitals:   10/18/20 1930 10/18/20 2000 10/18/20 2001 10/18/20 2015  BP: (!) 208/124 (!) 190/115 (!) 190/115 (!) 182/96  Pulse: 80 88  93  Resp: (!) 24 (!) 29  (!) 29  Temp:    97.8 F (36.6 C)  TempSrc:    Oral  SpO2: 99% 95%  96%  Weight:      Height:       Eyes: PERRL, lids and conjunctivae normal ENMT: Mucous membranes are moist.  Neck: normal, supple, no masses, no thyromegaly Respiratory: clear to auscultation bilaterally, no wheezing, no crackles. Normal respiratory effort. No accessory muscle use.  Cardiovascular: Regular rate and rhythm, no murmurs / rubs / gallops.  Trace extremity edema bilaterally. 2+ pedal pulses. No carotid bruits.  Abdomen: Abdomen distended and bloated but soft, no tenderness, no masses palpated. No hepatosplenomegaly.   Musculoskeletal: no clubbing / cyanosis. No joint deformity upper and lower extremities. Good ROM, no contractures. Normal muscle tone.  Skin: no rashes, lesions, ulcers. No induration Neurologic: No apparent cranial abnormality, moving extremities spontaneously. Psychiatric: Normal judgment and insight. Alert and oriented x 3. Normal mood.   Labs on Admission: I have personally reviewed following labs and imaging studies  CBC: Recent Labs  Lab 10/12/20 0625 10/18/20 1850  WBC 10.2 8.1  NEUTROABS 7.7 5.7  HGB 11.3* 10.6*  HCT 37.9 34.3*  MCV 76.0* 73.0*  PLT 265 867   Basic Metabolic Panel: Recent Labs  Lab 10/18/20 1850  NA 135  K 3.6  CL 99  CO2 26  GLUCOSE 211*  BUN 28*  CREATININE 1.35*  CALCIUM 9.0   GFR: Estimated Creatinine Clearance: 63.4 mL/min (A) (by C-G formula based on SCr of 1.35 mg/dL (H)). Liver Function Tests: Recent Labs  Lab 10/18/20 1850  AST 28  ALT 37  ALKPHOS 122  BILITOT 0.5  PROT 7.8  ALBUMIN 3.4*   Recent Labs  Lab 10/18/20 1850  LIPASE  27   CBG: Recent Labs  Lab 10/12/20 1123 10/12/20 1633 10/12/20 2203 10/13/20 0719 10/13/20 1114  GLUCAP 211* 238* 154* 149* 160*    Radiological Exams on Admission: [place]  CONTRAST  Result Date: 10/18/2020 CLINICAL DATA:  Weakness, malaise, abdominal pain. Recent history of acute diverticulitis. EXAM: CT ABDOMEN AND PELVIS WITHOUT CONTRAST TECHNIQUE: Multidetector CT imaging of the abdomen and pelvis was performed following the standard protocol without IV contrast. COMPARISON:  CT abdomen pelvis dated 10/10/2020. FINDINGS: Lower chest: There is mild left lower lobe atelectasis/scarring. The heart remains enlarged. Hepatobiliary: No focal liver abnormality is seen. Status post cholecystectomy. No biliary dilatation. Pancreas: Unremarkable. No pancreatic ductal dilatation or surrounding inflammatory changes. Spleen: Normal in size without focal abnormality. Adrenals/Urinary Tract: Adrenal glands are unremarkable. Kidneys are normal, without renal calculi, focal lesion, or hydronephrosis. Bladder is unremarkable. Stomach/Bowel: Stomach is within normal limits. Inflammatory changes associated with the distal descending colon have decreased since 10/10/2020. On today's exam there is a mild amount of fat stranding in the left pericolic gutter. No evidence of abscess or fistula formation. The appendix appears normal. No evidence of bowel obstruction. Vascular/Lymphatic: No significant vascular findings are present. No enlarged abdominal or pelvic lymph nodes. Reproductive: The patient is status post hysterectomy. Other: No abdominal wall hernia or abnormality. Musculoskeletal: Degenerative changes are seen in the spine. IMPRESSION: Decreased inflammatory changes of diverticulosis compared to exam dated 10/10/2020. No evidence of abscess. Electronically Signed   By: Zerita Boers M.D.   On: 10/18/2020 19:55   DG Chest Portable 1 View  Result Date: 10/18/2020 CLINICAL DATA:  Chest pain.  EXAM: PORTABLE CHEST 1 VIEW COMPARISON:  Oct 10, 2020 FINDINGS: There is no evidence of acute infiltrate, pleural effusion or pneumothorax. The cardiac silhouette is moderately enlarged and unchanged in size. The visualized skeletal structures are unremarkable. IMPRESSION: Stable cardiomegaly without acute or active cardiopulmonary disease. Electronically Signed   By: Virgina Norfolk M.D.   On: 10/18/2020 19:17    EKG: Independently reviewed.  Sinus rhythm rate 85.  QTc 487.  No significant change from prior.  Assessment/Plan Principal Problem:   Intractable vomiting Active Problems:   Acute on chronic combined systolic and diastolic ACC/AHA stage C congestive heart failure (HCC)   OSA on CPAP   Hypertension   Major depressive disorder, single episode, unspecified   Essential hypertension   Diabetes mellitus with complication ([name])   Intractable vomiting-recent diagnosis of acute diverticulitis, CT shows decreased inflammatory changes no abscess.  She is afebrile, no leukocytosis.  She reports 3 loose stools today after taking Linzess. ? Developing gastroparesis. -Zofran as needed -Resume Augmentin -If further loose stools consider stool studies. -Bowel rest with clear liquid diet.  Advance diet as tolerated  Acute on chronic systolic and diastolic CHF-abdominal distention, trace pitting extremity edema.  No significant weight changes per chart, but patient reports 2 pound weight gain, chest x-ray without acute abnormality.  BNP markedly elevated at 3754.  Troponin flat.  08/2020, showed G2DD<EF 35%. Home meds- torsemide 40 bid, likely poor absorption 2/2 gut edema. -IV Lasix 60 twice daily -Resume Entresto, Imdur, hydralazine, carvedilol -Magnesium, replete potassium  Hypertension-elevated systolic up to 829. -IV labetalol 10 mg as needed - ResumeCarvedilol, hydralazine, Imdur, spironolactone, Entresto.  Uncontrolled Diabetes Mellitus-random glucose 211.  A1c 9.8. - SSI- M -Resume  Lantus 30 units nightly  OSA on CPAP  Depression - Resume Abilify   DVT prophylaxis: Lovenox Code Status: Full code Family Communication: None at bedside Disposition Plan: ~ 2 days Consults called: None Admission status: obs, tele I certify that at the point of admission it is my clinical judgment that the patient will require inpatient hospital care spanning beyond 2  midnights from the point of admission due to high intensity of service, high risk for further deterioration and high frequency of surveillance required. The following factors support the patient status of inpatient:    Bethena Roys MD [facility]  10/18/2020, 10:15 PM

## 2020-10-18 NOTE — ED Triage Notes (Signed)
Pt c/o chest pain that started about 2 days ago. Pt states she was just discharged from hospital on Monday. While admitted pt diagnosed with diverticulitis and states she hasn't been able to eat much since being discharged.

## 2020-10-18 NOTE — ED Provider Notes (Signed)
Emergency Medicine Provider Triage Evaluation Note  Chelsey Santiago , a 45 y.o. female  was evaluated in triage.  Pt complains of generalized weakness, malaise, chest pressure and abdominal pain.  Symptoms began 2 days ago.  She was admitted here on 10/07/2020 for acute on chronic CHF, acute diverticulitis and hypertension.  She was discharged home on Monday and reports continued abdominal pain and feeling "bloated" since her discharge.  She is currently taking Augmentin.  She was referred to GI for colonoscopy.  Returns to the emergency department this evening due to continued symptoms.  She denies peripheral edema, shortness of breath, and vomiting.  Notes that her blood pressure remains elevated.  Review of Systems  Positive: Left-sided abdominal pain, bloating, chest pressure, generalized weakness and malaise Negative: Vomiting, fever, chills, shortness of breath and peripheral edema  Physical Exam  BP (!) 179/128 (BP Location: Right Arm)   Pulse 83   Temp 98.2 F (36.8 C) (Oral)   Resp 18   Ht 5\' 6"  (1.676 m)   Wt 101.7 kg   LMP 08/21/2019   SpO2 97%   BMI 36.17 kg/m  Gen:   Awake, no distress   Resp:  Normal effort, lungs clear MSK:   Moves extremities without difficulty, no pitting edema Other:  Tenderness of left abdomen.  No guarding or rebound.  Medical Decision Making  Medically screening exam initiated at 6:21 PM.  Appropriate orders placed.  Chelsey Santiago was informed that the remainder of the evaluation will be completed by another provider, this initial triage assessment does not replace that evaluation, and the importance of remaining in the ED until their evaluation is complete.  Patient here with history of CHF, poorly controlled hypertension, type 2 diabetes, currently treated with oral antibiotics for diverticulitis.  Here today due to continued symptoms since discharge home.  She will need further evaluation in the emergency department.  She is agreeable to plan.    Kem Parkinson, PA-C 10/18/20 1826    Milton Ferguson, MD 10/18/20 2257

## 2020-10-18 NOTE — ED Provider Notes (Signed)
[facility] Provider Note   CSN: [id number] Arrival date & time: 10/18/20  1741     History Chief Complaint  Patient presents with  . Chest Pain    Chelsey Santiago is a 45 y.o. female.  Patient has history of hypertension diverticulitis and congestive heart failure.  She has been vomiting for about 5 days after she got out of the hospital with diverticulitis.   Chest Pain      Past Medical History:  Diagnosis Date  . Anemia    H&H of 10.6/33 and 07/2008 and 11.9/35 and 09/2010  . Anxiety   . Chronic combined systolic and diastolic CHF (congestive heart failure) (HCC)    a. EF 40-45% by echo in 12/2015 with cath showing normal cors b. EF at 45% by repeat echo in 10/2018 c. EF at 30-35% in 03/2020  . Depression with anxiety   . Diabetes mellitus without complication ([place])   . Hypertension   . Hypertensive heart disease 2009   Pulmonary edema postpartum; mild to moderate mitral regurgitation when hospitalized for CHF in 2009; Echocardiogram in 12/2009-no MR and normal EF; normal CXR in 09/2010  . Migraine headache   . Miscarriage 03/19/2013  . Obesity 04/16/2009  . Osteoarthritis, knee 03/29/2011  . Preeclampsia   . Pulmonary edema   . Sleep apnea   . Threatened abortion in early pregnancy 03/15/2013    Patient Active Problem List   Diagnosis Date Noted  . Intractable vomiting 10/18/2020  . Acute diverticulitis 10/10/2020  . Chronic anemia 10/07/2020  . Pneumonia due to COVID-19 virus 06/09/2020  . Acute on chronic respiratory failure with hypoxia ([place]) 06/09/2020  . Obstructive sleep apnea 03/29/2020  . Flash pulmonary edema ([place]) 03/27/2020  . Back pain 12/19/2019  . Vaginal discharge 12/19/2019  . Vaginal itching 12/19/2019  . BV (bacterial vaginosis) 12/19/2019  . Chronic hypertension 12/19/2019  . Fibroids 09/26/2019  . S/P hysterectomy 09/26/2019  . Acute blood loss anemia 09/26/2019  . Abdominal pain, epigastric 03/14/2019  . Dysphagia  03/14/2019  . Gastroesophageal reflux disease 03/14/2019  . Class 2 obesity   . Acute exacerbation of CHF (congestive heart failure) ([place]) 10/26/2018  . Chronic combined systolic and diastolic CHF (congestive heart failure) ([place]) 06/02/2018  . Uncontrolled type 2 diabetes mellitus with hyperglycemia ([place]) 06/02/2018  . Influenza B 05/13/2018  . Hypomagnesemia 05/12/2018  . Headache 05/12/2018  . Upper respiratory tract infection   . HCAP (healthcare-associated pneumonia)   . Constipation 10/17/2017  . Bad headache   . CHF exacerbation ([place]) 09/29/2017  . Iron deficiency anemia 05/16/2017  . Vitamin D deficiency 11/25/2016  . Iron deficiency 11/25/2016  . History of acute myocardial infarction 10/05/2016  . CHF (congestive heart failure) ([place]) 05/06/2016  . Diabetes mellitus with complication ([place]) 62/95/2841  . Leukocytosis 05/06/2016  . Neuropathy 05/06/2016  . Depression 04/16/2016  . Chronic tension-type headache, not intractable 04/16/2016  . AKI (acute kidney injury) ([place])   . Hyperkalemia   . Nonischemic cardiomyopathy ([place])   . Acute on chronic combined systolic and diastolic ACC/AHA stage C congestive heart failure ([place]) 04/03/2016  . Acute on chronic combined systolic and diastolic CHF, NYHA class 4 ([place]) 04/03/2016  . Hypertensive emergency 04/03/2016  . Cardiomyopathy due to hypertension ([place]) 12/22/2015  . Normal coronary arteries 12/22/2015  . Troponin level elevated 12/22/2015  . NSTEMI (non-ST elevated myocardial infarction) ([place]) 12/20/2015  . Dental infection 10/10/2015  . Chest pain 09/05/2015  . Systolic CHF, chronic ([place]) 09/05/2015  .  LLQ pain   . Type 2 diabetes mellitus without complication ([place]) 30/16/0109  . Essential hypertension   . 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 Hypertensive urgency 04/22/2014  . Acute CHF ([place]) 04/22/2014  . S/P cesarean section 04/11/2014  . Acute pulmonary edema ([place]) 04/11/2014  . Postoperative anemia 04/11/2014  .  Elevated serum creatinine 04/11/2014  . Preeclampsia, severe 04/09/2014  . Pre-eclampsia superimposed on chronic hypertension, antepartum 04/08/2014  . Dyspnea 04/08/2014  . Polyhydramnios in third trimester, antepartum 03/14/2014  . Abnormal maternal glucose tolerance, antepartum 03/11/2014  . High-risk pregnancy 03/11/2014  . Pre-existing essential hypertension complicating pregnancy 32/35/5732  . Impaired glucose tolerance during pregnancy, antepartum 11/27/2013  . Leiomyoma of uterus 11/22/2013  . History of gestational diabetes in prior pregnancy, currently pregnant in first trimester 11/22/2013  . Hx of preeclampsia, prior pregnancy, currently pregnant 11/22/2013  . Short interval between pregnancies affecting pregnancy, antepartum 11/22/2013  . Supervision of high-risk pregnancy of elderly primigravida (>= 76 years old at delivery), third trimester 11/22/2013  . Miscarriage 03/19/2013  . Major depressive disorder, single episode, unspecified 09/27/2011  . Hypertension   . Hypertensive cardiovascular disease   . Microcytic anemia   . Osteoarthrosis involving lower leg 03/29/2011  . Hypokalemia 12/12/2009  . OSA on CPAP 12/09/2009  . Morbid obesity ([place]) 04/16/2009    Past Surgical History:  Procedure Laterality Date  . BREAST REDUCTION SURGERY  2002  . CARDIAC CATHETERIZATION N/A 12/22/2015   Procedure: Left Heart Cath and Coronary Angiography;  Surgeon: Peter M Martinique, MD;  Location: [facility];  Service: Cardiovascular;  Laterality: N/A;  . CESAREAN SECTION N/A 04/09/2014   Procedure: CESAREAN SECTION;  Surgeon: Mora Bellman, MD;  Location: [facility];  Service: Obstetrics;  Laterality: N/A;  . CHOLECYSTECTOMY    . HYSTERECTOMY ABDOMINAL WITH SALPINGECTOMY N/A 09/26/2019   Procedure: HYSTERECTOMY ABDOMINAL WITH SALPINGECTOMY;  Surgeon: Florian Buff, MD;  Location: [facility];  Service: Gynecology;  Laterality: N/A;  . LIPOMA EXCISION Right 09/26/2019   Procedure: EXCISION  LIPOMA RIGHT VULVAR;  Surgeon: Florian Buff, MD;  Location: [facility];  Service: Gynecology;  Laterality: Right;     OB History    Gravida  11   Para  6   Term  5   Preterm  1   AB  5   Living  6     SAB  3   IAB  2   Ectopic      Multiple  0   Live Births  6           Family History  Problem Relation Age of Onset  . Diabetes Mother 1  . Heart disease Mother   . Hyperlipidemia Paternal Grandfather   . Hypertension Paternal Grandfather   . Heart disease Father   . Hypertension Father   . Heart disease Maternal Grandmother 60  . ADD / ADHD Son   . Hypertension Maternal Uncle   . Heart attack Brother   . Sudden death Neg Hx   . Colon cancer Neg Hx   . Celiac disease Neg Hx   . Inflammatory bowel disease Neg Hx     Social History   Tobacco Use  . Smoking status: Never Smoker  . Smokeless tobacco: Never Used  Vaping Use  . Vaping Use: Never used  Substance Use Topics  . Alcohol use: Yes    Comment: occ  . Drug use: No    Home Medications Prior to Admission medications  Medication Sig Start Date End Date Taking? Authorizing Provider  ACCU-CHEK GUIDE test strip USE AS DIRECTED TO TESTCBLOOD SUGAR TWICE DAILY. 06/11/20   [provider]  albuterol (VENTOLIN HFA) 108 (90 Base) MCG/ACT inhaler Inhale 1 puff into the lungs every 6 (six) hours as needed for wheezing or shortness of breath. 03/30/20   Kathie Dike, MD  amoxicillin-clavulanate (AUGMENTIN) 875-125 MG tablet Take 1 tablet by mouth 2 (two) times daily for 7 days. 10/13/20 10/20/20  Johnson, Clanford L, MD  ARIPiprazole (ABILIFY) 10 MG tablet Take 1 tablet (10 mg total) by mouth daily. 10/30/18   Barton Dubois, MD  aspirin EC 81 MG tablet Take 81 mg by mouth daily.    [provider]  budesonide-formoterol (SYMBICORT) 80-4.5 MCG/ACT inhaler Inhale 2 puffs into the lungs 2 (two) times daily. 03/30/20   Kathie Dike, MD  carvedilol (COREG) 25 MG tablet Take 1 tablet (25 mg  total) by mouth 2 (two) times daily with a meal. 10/13/20   Johnson, Clanford L, MD  clonazePAM (KLONOPIN) 0.5 MG tablet Take 0.5 mg by mouth 2 (two) times daily. 12/19/18   [provider]  gabapentin (NEURONTIN) 800 MG tablet Take 800 mg by mouth 3 (three) times daily. 09/01/20   [provider]  hydrALAZINE (APRESOLINE) 25 MG tablet Take 1 tablet (25 mg total) by mouth 3 (three) times daily. 10/13/20   Johnson, Clanford L, MD  insulin glargine (LANTUS) 100 UNIT/ML injection Inject 30 Units at bedtime into the skin.     [provider]  insulin lispro (HUMALOG) 100 UNIT/ML injection Inject 10-16 Units into the skin 3 (three) times daily before meals.    [provider]  Ipratropium-Albuterol (COMBIVENT RESPIMAT) 20-100 MCG/ACT AERS respimat Inhale 1 puff into the lungs every 6 (six) hours as needed for wheezing or shortness of breath. 05/04/18   Manuella Ghazi, Pratik D, DO  isosorbide mononitrate (IMDUR) 30 MG 24 hr tablet Take 1 tablet (30 mg total) by mouth daily. 10/14/20   Johnson, Clanford L, MD  linaclotide (LINZESS) 145 MCG CAPS capsule Take 1 capsule (145 mcg total) by mouth daily as needed (constipation). 10/13/20   Johnson, Clanford L, MD  liraglutide (VICTOZA) 18 MG/3ML SOPN Inject 1.2 mg into the skin daily. 09/04/20   Brita Romp, NP  nitroGLYCERIN (NITROSTAT) 0.4 MG SL tablet Place 1 tablet (0.4 mg total) under the tongue every 5 (five) minutes as needed for chest pain. 09/05/15   Kathie Dike, MD  ondansetron (ZOFRAN ODT) 8 MG disintegrating tablet Take 1 tablet (8 mg total) by mouth every 8 (eight) hours as needed for nausea or vomiting. 09/06/19   Long, Wonda Olds, MD  oxyCODONE-acetaminophen (PERCOCET/ROXICET) 5-325 MG tablet Take 1 tablet by mouth every 6 (six) hours as needed for up to 5 days for severe pain. 10/13/20 10/18/20  Johnson, Clanford L, MD  potassium chloride SA (KLOR-CON M20) 20 MEQ tablet Take 1 tablet (20 mEq total) by mouth daily. 10/13/20  01/11/21  Johnson, Clanford L, MD  pyridostigmine (MESTINON) 60 MG tablet Take 60 mg by mouth every 8 (eight) hours.    [provider]  sacubitril-valsartan (ENTRESTO) 97-103 MG Take 1 tablet by mouth 2 (two) times daily. 03/16/19   Arnoldo Lenis, MD  spironolactone (ALDACTONE) 25 MG tablet Take 0.5 tablets (12.5 mg total) by mouth daily. 09/14/20   Johnson, Clanford L, MD  torsemide (DEMADEX) 20 MG tablet Take 2 tablets (40 mg total) by mouth 2 (two) times daily. 09/05/20  Verta Ellen., NP  traZODone (DESYREL) 100 MG tablet Take 2 tablets (200 mg total) by mouth at bedtime. 10/29/18   Barton Dubois, MD    Allergies    Diclofenac, Tramadol, and Vicodin [hydrocodone-acetaminophen]  Review of Systems   Review of Systems  Cardiovascular: Positive for chest pain.    Physical Exam Updated Vital Signs BP (!) 182/96   Pulse 93   Temp 97.8 F (36.6 C) (Oral)   Resp (!) 29   Ht 5\' 6"  (1.676 m)   Wt 101.7 kg   LMP 08/21/2019   SpO2 96%   BMI 36.17 kg/m   Physical Exam  ED Results / Procedures / Treatments   Labs (all labs ordered are listed, but only abnormal results are displayed) Labs Reviewed  CBC WITH DIFFERENTIAL/PLATELET - Abnormal; Notable for the following components:      Result Value   Hemoglobin 10.6 (*)    HCT 34.3 (*)    MCV 73.0 (*)    MCH 22.6 (*)    All other components within normal limits  COMPREHENSIVE METABOLIC PANEL - Abnormal; Notable for the following components:   Glucose, Bld 211 (*)    BUN 28 (*)    Creatinine, Ser 1.35 (*)    Albumin 3.4 (*)    GFR, Estimated 49 (*)    All other components within normal limits  BRAIN NATRIURETIC PEPTIDE - Abnormal; Notable for the following components:   B Natriuretic Peptide 3,754.0 (*)    All other components within normal limits  RESP PANEL BY RT-PCR (FLU A&B, COVID) ARPGX2  LIPASE, BLOOD  TROPONIN I (HIGH SENSITIVITY)  TROPONIN I (HIGH SENSITIVITY)    EKG EKG  Interpretation  Date/Time:  Saturday October 18 2020 18:00:22 EDT Ventricular Rate:  85 PR Interval:  184 QRS Duration: 104 QT Interval:  410 QTC Calculation: 487 R Axis:   150 Text Interpretation: Normal sinus rhythm Right axis deviation Abnormal QRS-T angle, consider primary T wave abnormality Prolonged QT Abnormal ECG Confirmed by Milton Ferguson [phone]) on 10/18/2020 8:13:38 PM   Radiology CT ABDOMEN PELVIS WO CONTRAST  Result Date: 10/18/2020 CLINICAL DATA:  Weakness, malaise, abdominal pain. Recent history of acute diverticulitis. EXAM: CT ABDOMEN AND PELVIS WITHOUT CONTRAST TECHNIQUE: Multidetector CT imaging of the abdomen and pelvis was performed following the standard protocol without IV contrast. COMPARISON:  CT abdomen pelvis dated 10/10/2020. FINDINGS: Lower chest: There is mild left lower lobe atelectasis/scarring. The heart remains enlarged. Hepatobiliary: No focal liver abnormality is seen. Status post cholecystectomy. No biliary dilatation. Pancreas: Unremarkable. No pancreatic ductal dilatation or surrounding inflammatory changes. Spleen: Normal in size without focal abnormality. Adrenals/Urinary Tract: Adrenal glands are unremarkable. Kidneys are normal, without renal calculi, focal lesion, or hydronephrosis. Bladder is unremarkable. Stomach/Bowel: Stomach is within normal limits. Inflammatory changes associated with the distal descending colon have decreased since 10/10/2020. On today's exam there is a mild amount of fat stranding in the left pericolic gutter. No evidence of abscess or fistula formation. The appendix appears normal. No evidence of bowel obstruction. Vascular/Lymphatic: No significant vascular findings are present. No enlarged abdominal or pelvic lymph nodes. Reproductive: The patient is status post hysterectomy. Other: No abdominal wall hernia or abnormality. Musculoskeletal: Degenerative changes are seen in the spine. IMPRESSION: Decreased inflammatory changes of  diverticulosis compared to exam dated 10/10/2020. No evidence of abscess. Electronically Signed   By: Zerita Boers M.D.   On: 10/18/2020 19:55   DG Chest Portable 1 View  Result Date: 10/18/2020 CLINICAL  DATA:  Chest pain. EXAM: PORTABLE CHEST 1 VIEW COMPARISON:  Oct 10, 2020 FINDINGS: There is no evidence of acute infiltrate, pleural effusion or pneumothorax. The cardiac silhouette is moderately enlarged and unchanged in size. The visualized skeletal structures are unremarkable. IMPRESSION: Stable cardiomegaly without acute or active cardiopulmonary disease. Electronically Signed   By: Virgina Norfolk M.D.   On: 10/18/2020 19:17    Procedures Procedures   Medications Ordered in ED Medications  ondansetron (ZOFRAN) injection 4 mg (4 mg Intravenous Given 10/18/20 1903)  hydrALAZINE (APRESOLINE) injection 10 mg (10 mg Intravenous Given 10/18/20 1925)  sodium chloride 0.9 % bolus 250 mL (0 mLs Intravenous Stopped 10/18/20 1937)  hydrALAZINE (APRESOLINE) injection 10 mg (10 mg Intravenous Given 10/18/20 2001)    ED Course  I have reviewed the triage vital signs and the nursing notes.  Pertinent labs & imaging results that were available during my care of the patient were reviewed by me and considered in my medical decision making (see chart for details). CRITICAL CARE Performed by: Milton Ferguson Total critical care time: 45 minutes Critical care time was exclusive of separately billable procedures and treating other patients. Critical care was necessary to treat or prevent imminent or life-threatening deterioration. Critical care was time spent personally by me on the following activities: development of treatment plan with patient and/or surrogate as well as nursing, discussions with consultants, evaluation of patient's response to treatment, examination of patient, obtaining history from patient or surrogate, ordering and performing treatments and interventions, ordering and review of laboratory  studies, ordering and review of radiographic studies, pulse oximetry and re-evaluation of patient's condition.    MDM Rules/Calculators/A&P                          Patient with hypertensive urgency from not being able to take her blood pressure medicines from vomiting due to the diverticulitis.  She also has congestive heart failure.  She will be admitted to medicine Final Clinical Impression(s) / ED Diagnoses Final diagnoses:  Hypertensive urgency    Rx / DC Orders ED Discharge Orders    None       Milton Ferguson, MD 10/18/20 2104

## 2020-10-19 DIAGNOSIS — I5043 Acute on chronic combined systolic (congestive) and diastolic (congestive) heart failure: Secondary | ICD-10-CM | POA: Diagnosis not present

## 2020-10-19 DIAGNOSIS — E118 Type 2 diabetes mellitus with unspecified complications: Secondary | ICD-10-CM | POA: Diagnosis not present

## 2020-10-19 DIAGNOSIS — I10 Essential (primary) hypertension: Secondary | ICD-10-CM | POA: Diagnosis not present

## 2020-10-19 DIAGNOSIS — R112 Nausea with vomiting, unspecified: Secondary | ICD-10-CM | POA: Diagnosis not present

## 2020-10-19 LAB — BASIC METABOLIC PANEL
Anion gap: 8 (ref 5–15)
BUN: 26 mg/dL — ABNORMAL HIGH (ref 6–20)
CO2: 29 mmol/L (ref 22–32)
Calcium: 8.8 mg/dL — ABNORMAL LOW (ref 8.9–10.3)
Chloride: 99 mmol/L (ref 98–111)
Creatinine, Ser: 1.33 mg/dL — ABNORMAL HIGH (ref 0.44–1.00)
GFR, Estimated: 50 mL/min — ABNORMAL LOW (ref >60)
Glucose, Bld: 131 mg/dL — ABNORMAL HIGH (ref 70–99)
Potassium: 3.3 mmol/L — ABNORMAL LOW (ref 3.5–5.1)
Sodium: 136 mmol/L (ref 135–145)

## 2020-10-19 LAB — GLUCOSE, CAPILLARY
Glucose-Capillary: 131 mg/dL — ABNORMAL HIGH (ref 70–99)
Glucose-Capillary: 128 mg/dL — ABNORMAL HIGH (ref 70–99)
Glucose-Capillary: 134 mg/dL — ABNORMAL HIGH (ref 70–99)
Glucose-Capillary: 166 mg/dL — ABNORMAL HIGH (ref 70–99)

## 2020-10-19 LAB — CBC
HCT: 33.7 % — ABNORMAL LOW (ref 36.0–46.0)
Hemoglobin: 10.2 g/dL — ABNORMAL LOW (ref 12.0–15.0)
MCH: 22 pg — ABNORMAL LOW (ref 26.0–34.0)
MCHC: 30.3 g/dL (ref 30.0–36.0)
MCV: 72.8 fL — ABNORMAL LOW (ref 80.0–100.0)
Platelets: 291 10*3/uL (ref 150–400)
RBC: 4.63 MIL/uL (ref 3.87–5.11)
RDW: 14.6 % (ref 11.5–15.5)
WBC: 6.9 10*3/uL (ref 4.0–10.5)
nRBC: 0 % (ref 0.0–0.2)

## 2020-10-19 LAB — C DIFFICILE QUICK SCREEN W PCR REFLEX
C Diff antigen: NEGATIVE
C Diff interpretation: NOT DETECTED
C Diff toxin: NEGATIVE

## 2020-10-19 MED ORDER — POTASSIUM CHLORIDE CRYS ER 20 MEQ PO TBCR
40.0000 meq | EXTENDED_RELEASE_TABLET | Freq: Once | ORAL | Status: AC
  Administered 2020-10-19: 40 meq via ORAL
  Filled 2020-10-19: qty 2

## 2020-10-19 MED ORDER — METOCLOPRAMIDE HCL 5 MG/ML IJ SOLN
5.0000 mg | Freq: Four times a day (QID) | INTRAMUSCULAR | Status: DC
  Administered 2020-10-19 – 2020-10-20 (×4): 5 mg via INTRAVENOUS
  Filled 2020-10-19 (×4): qty 2

## 2020-10-19 MED ORDER — TORSEMIDE 20 MG PO TABS
40.0000 mg | ORAL_TABLET | Freq: Two times a day (BID) | ORAL | Status: DC
  Administered 2020-10-19 – 2020-10-20 (×2): 40 mg via ORAL
  Filled 2020-10-19 (×2): qty 2

## 2020-10-19 MED ORDER — ONDANSETRON 8 MG PO TBDP: 8.0000 mg | ORAL_TABLET | Freq: Three times a day (TID) | ORAL | 0 refills | Status: DC | PRN

## 2020-10-19 NOTE — Progress Notes (Signed)
SATURATION QUALIFICATIONS: (This note is used to comply with regulatory documentation for home oxygen)  Patient Saturations on Room Air at Rest = 100%  Patient Saturations on Room Air while Ambulating = 95%  Patient Saturations on n/a Liters of oxygen while Ambulating = n//a%  Please briefly explain why patient needs home oxygen: 

## 2020-10-19 NOTE — Plan of Care (Signed)
  Problem: Education: Goal: Knowledge of General Education information will improve Description Including pain rating scale, medication(s)/side effects and non-pharmacologic comfort measures Outcome: Progressing   Problem: Health Behavior/Discharge Planning: Goal: Ability to manage health-related needs will improve Outcome: Progressing   

## 2020-10-19 NOTE — Progress Notes (Signed)
Patient has refused the use of a CPAP;RT placed 2lpm [place] at bedside for sleep. RN was notified of patients decision.

## 2020-10-19 NOTE — Progress Notes (Signed)
PROGRESS NOTE    Chelsey Santiago  HGD:[id number] DOB: 11/27/1975 DOA: 10/18/2020 PCP: Rosita Fire, MD    Brief Narrative:  45 year old female with a history of combined CHF, diabetes, hypertension, obstructive sleep apnea, was recently in the hospital when she was discharged on 5/30 after being treated for acute diverticulitis and acute CHF.  She returns to the hospital with intractable vomiting and loose stools.  CT scan showed improving diverticulitis.  Since she was unable to keep her medications down, she was also hypertensive.  She has been admitted for further treatments.   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 difficile, GI pathogen panel) -She is a diabetic and may have a component of gastroparesis, will give a trial of Reglan -Downgrade diet to liquids for now  Chronic combined CHF -Ejection fraction of 35% with grade 2 diastolic dysfunction on echocardiogram from 08/2020 -Although BNP remains markedly elevated, clinically she appears to be compensated at this time without any significant volume overload -She is able to ambulate on room air without any shortness of breath -Continue home dose of torsemide -She is also on Coreg, Entresto, Imdur, spironolactone  Hypertensive urgency -Blood pressure significantly elevated on admission, likely related to her inability to keep her medications down -She is continued on home meds of Coreg, hydralazine, Imdur,  Entresto  Diabetes -She is continued on home dose of Lantus. -She is also on sliding scale insulin -Blood sugars have been stable  Recent diverticulitis -CT abdomen done on admission shows improving diverticulitis from her last admission -Continue on Augmentin to complete her course  Hypokalemia -Replace -Check magnesium  History of orthostatic hypotension -Chronically on pyridostigmine  Diabetic neuropathy -Continue on home dose of gabapentin  Depression with anxiety -Continue on home dose of Klonopin and Abilify  Obstructive sleep apnea -Continue CPAP   DVT prophylaxis: enoxaparin (LOVENOX) injection 40 mg Start: 10/19/20 1000  Code Status: Full code Family Communication: Discussed with patient Disposition Plan: Status is: Observation  The patient remains OBS appropriate and will d/c before 2 midnights.  Dispo: The patient is from: Home              Anticipated d/c is to: Home              Patient currently is not medically stable to d/c.   Difficult to place patient No         Consultants:     Procedures:     Antimicrobials:   Augmentin   Subjective: Patient is continued to have loose stools through the day today.  She reports having several bowel movements.  Initially, nausea and vomiting have improved, but when she ate a solid meal, she began vomiting again.  Objective: Vitals:   10/18/20 2255 10/19/20 0355 10/19/20 0839 10/19/20 1427  BP: (!) 163/114 (!) 159/101  (!) 149/91  Pulse: 89 81  72  Resp: 18 18  18   Temp: 98 F (36.7 C) 98.2 F (36.8 C)  97.9 F (36.6 C)  TempSrc: Oral   Oral  SpO2: 98% 98% 98% 94%  Weight: 101.8 kg     Height: 5\' 6"  (1.676 m)       Intake/Output Summary (Last 24 hours) at 10/19/2020 1810 Last data filed at 10/19/2020 1200 Gross per 24 hour  Intake 1420 ml  Output 1800 ml  Net -380 ml   Filed Weights   10/18/20 1757 10/18/20 2255  Weight: 101.7 kg 101.8 kg    Examination:  General exam: Appears  calm and comfortable  Respiratory system: Clear to auscultation. Respiratory effort normal. Cardiovascular system: S1 & S2 heard, RRR. No JVD, murmurs, rubs, gallops or clicks. No pedal edema. Gastrointestinal system: Abdomen is nondistended, soft and nontender. No organomegaly or masses felt. Normal bowel sounds heard. Central nervous system: Alert and oriented. No focal neurological deficits. Extremities: Symmetric 5 x 5 power. Skin: No rashes, lesions or ulcers Psychiatry: Judgement and insight appear normal. Mood & affect appropriate.     Data Reviewed: I have personally reviewed following labs and imaging studies  CBC: Recent Labs  Lab 10/18/20 1850 10/19/20 0510  WBC 8.1 6.9  NEUTROABS 5.7  --   HGB 10.6* 10.2*  HCT 34.3* 33.7*  MCV 73.0* 72.8*  PLT 295 734   Basic Metabolic Panel: Recent Labs  Lab 10/18/20 1850 10/18/20 2058 10/19/20 0510  NA 135  --  136  K 3.6  --  3.3*  CL 99  --  99  CO2 26  --  29  GLUCOSE 211*  --  131*  BUN 28*  --  26*  CREATININE 1.35*  --  1.33*  CALCIUM 9.0  --  8.8*  MG  --  1.7  --    GFR: Estimated Creatinine Clearance: 64.3 mL/min (A) (by C-G formula based on SCr of 1.33 mg/dL (H)). Liver Function Tests: Recent Labs  Lab 10/18/20 1850  AST 28  ALT 37  ALKPHOS 122  BILITOT 0.5  PROT 7.8  ALBUMIN 3.4*   Recent Labs  Lab 10/18/20 1850  LIPASE 27   No results for input(s): AMMONIA in the last 168 hours. Coagulation Profile: No results for input(s): INR, PROTIME in the last 168 hours. Cardiac Enzymes: No results for input(s): CKTOTAL, CKMB, CKMBINDEX, TROPONINI in the last 168 hours. BNP (last 3 results) No results for input(s): PROBNP in the last 8760 hours. HbA1C: No results for input(s): HGBA1C in the last 72 hours. CBG: Recent Labs  Lab 10/13/20 1114 10/18/20 2258 10/19/20 0808 10/19/20 1208 10/19/20 1655  GLUCAP 160* 186* 131* 128* 134*   Lipid Profile: No results for input(s): CHOL, HDL, LDLCALC,  TRIG, CHOLHDL, LDLDIRECT in the last 72 hours. Thyroid Function Tests: No results for input(s): TSH, T4TOTAL, FREET4, T3FREE, THYROIDAB in the last 72 hours. Anemia Panel: No results for input(s): VITAMINB12, FOLATE, FERRITIN, TIBC, IRON, RETICCTPCT in the last 72 hours. Sepsis Labs: No results for input(s): PROCALCITON, LATICACIDVEN in the last 168 hours.  Recent Results (from the past 240 hour(s))  Resp Panel by RT-PCR (Flu A&B, Covid) Nasopharyngeal Swab     Status: None   Collection Time: 10/18/20  8:07 PM   Specimen: Nasopharyngeal Swab; Nasopharyngeal(NP) swabs in vial transport medium  Result Value Ref Range Status   SARS Coronavirus 2 by RT PCR NEGATIVE NEGATIVE Final    Comment: (NOTE) SARS-CoV-2 target nucleic acids are NOT DETECTED.  The SARS-CoV-2 RNA is generally detectable in upper respiratory specimens during the acute phase of infection. The lowest concentration of SARS-CoV-2 viral copies this assay can detect is  138 copies/mL. A negative result does not preclude SARS-Cov-2 infection and should not be used as the sole basis for treatment or other patient management decisions. A negative result may occur with  improper specimen collection/handling, submission of specimen other than nasopharyngeal swab, presence of viral mutation(s) within the areas targeted by this assay, and inadequate number of viral copies(<138 copies/mL). A negative result must be combined with clinical observations, patient history, and epidemiological information. The expected result is Negative.  Fact Sheet for Patients:  EntrepreneurPulse.com.au  Fact Sheet for Healthcare Providers:  IncredibleEmployment.be  This test is no t yet approved or cleared by the [place] FDA and  has been authorized for detection and/or diagnosis of SARS-CoV-2 by FDA under an Emergency Use Authorization (EUA). This EUA will remain  in effect (meaning this test can be  used) for the duration of the COVID-19 declaration under Section 564(b)(1) of the Act, 21 U.S.C.section 360bbb-3(b)(1), unless the authorization is terminated  or revoked sooner.       Influenza A by PCR NEGATIVE NEGATIVE Final   Influenza B by PCR NEGATIVE NEGATIVE Final    Comment: (NOTE) The Xpert Xpress SARS-CoV-2/FLU/RSV plus assay is intended as an aid in the diagnosis of influenza from Nasopharyngeal swab specimens and should not be used as a sole basis for treatment. Nasal washings and aspirates are unacceptable for Xpert Xpress SARS-CoV-2/FLU/RSV testing.  Fact Sheet for Patients: EntrepreneurPulse.com.au  Fact Sheet for Healthcare Providers: IncredibleEmployment.be  This test is not yet approved or cleared by the [place] FDA and has been authorized for detection and/or diagnosis of SARS-CoV-2 by FDA under an Emergency Use Authorization (EUA). This EUA will remain in effect (meaning this test can be used) for the duration of the COVID-19 declaration under Section 564(b)(1) of the Act, 21 U.S.C. section 360bbb-3(b)(1), unless the authorization is terminated or revoked.  Performed at [facility], [address] [place], [place] [address]          Radiology Studies: CT ABDOMEN PELVIS WO CONTRAST  Result Date: 10/18/2020 CLINICAL DATA:  Weakness, malaise, abdominal pain. Recent history of acute diverticulitis. EXAM: CT ABDOMEN AND PELVIS WITHOUT CONTRAST TECHNIQUE: Multidetector CT imaging of the abdomen and pelvis was performed following the standard protocol without IV contrast. COMPARISON:  CT abdomen pelvis dated 10/10/2020. FINDINGS: Lower chest: There is mild left lower lobe atelectasis/scarring. The heart remains enlarged. Hepatobiliary: No focal liver abnormality is seen. Status post cholecystectomy. No biliary dilatation. Pancreas: Unremarkable. No pancreatic ductal dilatation or surrounding inflammatory changes. Spleen:  Normal in size without focal abnormality. Adrenals/Urinary Tract: Adrenal glands are unremarkable. Kidneys are normal, without renal calculi, focal lesion, or hydronephrosis. Bladder is unremarkable. Stomach/Bowel: Stomach is within normal limits. Inflammatory changes associated with the distal descending colon have decreased since 10/10/2020. On today's exam there is a mild amount of fat stranding in the left pericolic gutter. No evidence of abscess or fistula formation. The appendix appears normal. No evidence of bowel obstruction. Vascular/Lymphatic: No significant vascular findings are present. No enlarged abdominal or pelvic lymph nodes. Reproductive: The patient is status post hysterectomy. Other: No abdominal wall hernia or abnormality. Musculoskeletal: Degenerative changes are seen in the spine. IMPRESSION: Decreased inflammatory changes of diverticulosis compared to exam dated 10/10/2020. No evidence of abscess. Electronically Signed   By: Zerita Boers M.D.   On: 10/18/2020 19:55   DG Chest Portable 1 View  Result Date: 10/18/2020 CLINICAL DATA:  Chest pain. EXAM: PORTABLE CHEST 1 VIEW COMPARISON:  Oct 10, 2020 FINDINGS:  There is no evidence of acute infiltrate, pleural effusion or pneumothorax. The cardiac silhouette is moderately enlarged and unchanged in size. The visualized skeletal structures are unremarkable. IMPRESSION: Stable cardiomegaly without acute or active cardiopulmonary disease. Electronically Signed   By: Virgina Norfolk M.D.   On: 10/18/2020 19:17        Scheduled Meds: . amoxicillin-clavulanate  1 tablet Oral BID  . ARIPiprazole  10 mg Oral Daily  . aspirin EC  81 mg Oral Daily  . carvedilol  25 mg Oral BID WC  . clonazePAM  0.5 mg Oral BID  . enoxaparin (LOVENOX) injection  40 mg Subcutaneous Q24H  . gabapentin  800 mg Oral TID  . hydrALAZINE  25 mg Oral TID  . insulin aspart  0-15 Units Subcutaneous TID WC  . insulin aspart  0-5 Units Subcutaneous QHS  . insulin  glargine  30 Units Subcutaneous QHS  . isosorbide mononitrate  30 mg Oral QHS  . metoCLOPramide (REGLAN) injection  5 mg Intravenous Q6H  . mometasone-formoterol  2 puff Inhalation BID  . potassium chloride SA  20 mEq Oral Daily  . pyridostigmine  60 mg Oral Q8H  . sacubitril-valsartan  1 tablet Oral BID  . spironolactone  12.5 mg Oral Daily  . torsemide  40 mg Oral BID  . traZODone  200 mg Oral QHS   Continuous Infusions:   LOS: 0 days    Time spent: 37mins    Kathie Dike, MD [facility]   If 7PM-7AM, please contact night-coverage [email or web address]  10/19/2020, 6:10 PM

## 2020-10-20 ENCOUNTER — Ambulatory Visit (HOSPITAL_COMMUNITY): Payer: Medicaid Other | Admitting: Physical Therapy

## 2020-10-20 ENCOUNTER — Encounter: Payer: Self-pay | Admitting: *Deleted

## 2020-10-20 DIAGNOSIS — Z9989 Dependence on other enabling machines and devices: Secondary | ICD-10-CM

## 2020-10-20 DIAGNOSIS — E118 Type 2 diabetes mellitus with unspecified complications: Secondary | ICD-10-CM | POA: Diagnosis not present

## 2020-10-20 DIAGNOSIS — I5042 Chronic combined systolic (congestive) and diastolic (congestive) heart failure: Secondary | ICD-10-CM

## 2020-10-20 DIAGNOSIS — R112 Nausea with vomiting, unspecified: Secondary | ICD-10-CM | POA: Diagnosis not present

## 2020-10-20 DIAGNOSIS — G4733 Obstructive sleep apnea (adult) (pediatric): Secondary | ICD-10-CM

## 2020-10-20 DIAGNOSIS — I10 Essential (primary) hypertension: Secondary | ICD-10-CM | POA: Diagnosis not present

## 2020-10-20 DIAGNOSIS — E1165 Type 2 diabetes mellitus with hyperglycemia: Secondary | ICD-10-CM

## 2020-10-20 LAB — BASIC METABOLIC PANEL
Anion gap: 10 (ref 5–15)
BUN: 23 mg/dL — ABNORMAL HIGH (ref 6–20)
CO2: 29 mmol/L (ref 22–32)
Calcium: 8.6 mg/dL — ABNORMAL LOW (ref 8.9–10.3)
Chloride: 98 mmol/L (ref 98–111)
Creatinine, Ser: 1.42 mg/dL — ABNORMAL HIGH (ref 0.44–1.00)
GFR, Estimated: 46 mL/min — ABNORMAL LOW (ref >60)
Glucose, Bld: 182 mg/dL — ABNORMAL HIGH (ref 70–99)
Potassium: 3.3 mmol/L — ABNORMAL LOW (ref 3.5–5.1)
Sodium: 137 mmol/L (ref 135–145)

## 2020-10-20 LAB — CBC
HCT: 36.7 % (ref 36.0–46.0)
Hemoglobin: 11.1 g/dL — ABNORMAL LOW (ref 12.0–15.0)
MCH: 22.4 pg — ABNORMAL LOW (ref 26.0–34.0)
MCHC: 30.2 g/dL (ref 30.0–36.0)
MCV: 74.1 fL — ABNORMAL LOW (ref 80.0–100.0)
Platelets: 312 10*3/uL (ref 150–400)
RBC: 4.95 MIL/uL (ref 3.87–5.11)
RDW: 15.1 % (ref 11.5–15.5)
WBC: 7.2 10*3/uL (ref 4.0–10.5)
nRBC: 0 % (ref 0.0–0.2)

## 2020-10-20 LAB — GASTROINTESTINAL PANEL BY PCR, STOOL (REPLACES STOOL CULTURE)

## 2020-10-20 LAB — GLUCOSE, CAPILLARY
Glucose-Capillary: 153 mg/dL — ABNORMAL HIGH (ref 70–99)
Glucose-Capillary: 117 mg/dL — ABNORMAL HIGH (ref 70–99)

## 2020-10-20 LAB — MAGNESIUM: Magnesium: 1.5 mg/dL — ABNORMAL LOW (ref 1.7–2.4)

## 2020-10-20 MED ORDER — DIPHENOXYLATE-ATROPINE 2.5-0.025 MG PO TABS
1.0000 | ORAL_TABLET | Freq: Four times a day (QID) | ORAL | Status: DC | PRN
  Administered 2020-10-20: 1 via ORAL
  Filled 2020-10-20: qty 1

## 2020-10-20 MED ORDER — INSULIN GLARGINE 100 UNIT/ML ~~LOC~~ SOLN: 35.0000 [IU] | Freq: Every day | SUBCUTANEOUS | 11 refills | Status: DC

## 2020-10-20 NOTE — Plan of Care (Signed)

## 2020-10-20 NOTE — Discharge Summary (Signed)
Physician Discharge Summary  Chelsey Santiago FKC:[id number] DOB: [birth year] DOA: 10/18/2020  PCP: Rosita Fire, MD  Admit date: 10/18/2020 Discharge date: 10/20/2020  Time spent: 35 minutes  Recommendations for Outpatient Follow-up:  1. Reassess blood pressure and further adjust antihypertensive treatment as needed 2. Close monitoring of patient's CBGs with adjustment to hypoglycemic therapy as required 3. Repeat basic metabolic panel to follow cholesterol function.   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  Discharged home with instruction to follow-up with PCP in 10 days.  CODE STATUS: Full code.  Diet recommendation: Heart healthy/modified carbohydrate diet and low calorie.  Filed Weights   10/18/20 1757 10/18/20 2255  Weight: 101.7 kg 101.8 kg    Increase history of present illness:  45 year old female with a history of combined CHF, diabetes, hypertension, obstructive sleep apnea, was recently in the hospital when she was discharged on 5/30 after being treated for acute diverticulitis and acute CHF.  She returns to the hospital with intractable vomiting and loose stools.  CT scan showed improving diverticulitis.  Since she was unable to keep her medications down, she was also hypertensive.  She has been admitted for further treatments.  Hospital Course:  Intractable vomiting, diarrhea -This may be related to a gastroenteritis/gastroparesis. -C. difficile PCR negative -GI panel studies pending at time of discharge; -Patient significantly improved and able to tolerate diet at time of discharge.  Chronic combined CHF -Ejection fraction of  35% with grade 2 diastolic dysfunction on echocardiogram from 08/2020 -Although BNP remains markedly elevated, clinically she appears to be compensated at this time without any significant volume overload on examination. -She is able to ambulate on room air without any shortness of breath -Continue home dose of torsemide -She is also on Coreg, Entresto, Imdur, spironolactone -Continue outpatient follow-up with cardiology service.  Hypertensive urgency -Blood pressure significantly elevated on admission, likely related to her inability to keep her medications down due to initial presentation of nausea/vomiting. -She is continued on home meds of Coreg, hydralazine, Imdur, Entresto -Blood pressure/vital signs improved once medication has been resumed. -Advised to follow heart healthy/low-sodium diet. -Reassess blood pressure Will DC gentleman adjust antihypertensive regimen as needed.  Uncontrolled type II diabetes with hyperglycemia -Most recent A1c 9.8 demonstrating poor diabetes control -Resume home hypoglycemic regimen with adjustment to her Lantus dose. -Advised to follow modified carbohydrate diet.  Recent diverticulitis -CT abdomen done on admission shows improving diverticulitis from her last admission -Patient will continue Augmentin to complete antibiotic therapy -Advised to increase the use of natural probiotics.  Hypokalemia -Repleted and is stable at discharge. -Magnesium within normal limits.  History of orthostatic hypotension -Chronically on pyridostigmine -Stable vital signs at discharge.  Diabetic neuropathy -Continue on home dose of gabapentin -Continue closely monitoring of CBGs and further adjustment to hypoglycemic regimen.  Depression with anxiety -Continue on home dose of Klonopin and Abilify -Stable mood at discharge.  Obstructive sleep apnea/class II obesity -Continue CPAP -Body mass index is 36.22 kg/m. -Low calorie diet, portion control and  increase physical activity discussed with patient.  Procedures:  See below for x-ray reports.  Consultations:  None   Discharge Exam: Vitals:   10/20/20 0715 10/20/20 1345  BP:  111/61  Pulse:  80  Resp:  18  Temp:  97.8 F (36.6 C)  SpO2: 98% 98%    General: Frail, no chest pain, no nausea, no vomiting, tolerating diet and feeling ready to go home. Cardiovascular: Rate controlled, no rubs, no gallops, unable to fully assess JVD due to habitus.  Patient denies orthopnea. Respiratory: Good saturation on room air; no using accessory muscles.  Good air movement bilaterally.  No wheezing or crackles on exam. Abdomen: Soft, obese, nontender, positive bowel sounds Extremities: No cyanosis or clubbing.  Discharge Instructions   Discharge Instructions    (HEART FAILURE PATIENTS) Call MD:  Anytime you have any of the following symptoms: 1) 3 pound weight gain in 24 hours or 5 pounds in 1 week 2) shortness of breath, with or without a dry hacking cough 3) swelling in the hands, feet or stomach 4) if you have to sleep on extra pillows at night in order to breathe.   Complete by: As directed    Diet - low sodium heart healthy   Complete by: As directed    Diet Carb Modified   Complete by: As directed    Discharge instructions   Complete by: As directed    Take medications as prescribed Maintain adequate hydration Arrange follow-up with PCP in 10 days Continue checking your weight on daily basis and follow modified carbohydrate/low-sodium diet Follow small multiple meals throughout the day to improve digestion and intestinal transit.     Allergies as of 10/20/2020      Reactions   Diclofenac Swelling   AND POSSIBLE SYNCOPE; tolerates ibuprofen per pt   Tramadol Nausea And Vomiting, Nausea Only   Itching (12/21); tolerates ibuprofen per pt   Vicodin [hydrocodone-acetaminophen] Itching, Nausea Only      Medication List    STOP taking these medications    oxyCODONE-acetaminophen 5-325 MG tablet Commonly known as: PERCOCET/ROXICET     TAKE these medications   Accu-Chek Guide test strip Generic drug: glucose blood USE AS DIRECTED TO TESTCBLOOD SUGAR TWICE DAILY.   albuterol 108 (90 Base) MCG/ACT inhaler Commonly known as: VENTOLIN HFA Inhale 1 puff into the lungs every 6 (six) hours as needed for wheezing or shortness of breath.   amoxicillin-clavulanate 875-125 MG tablet Commonly known as: Augmentin Take 1 tablet by mouth 2 (two) times daily for 7 days.   ARIPiprazole 10 MG tablet Commonly known as: ABILIFY Take 1 tablet (10 mg total) by mouth daily.   aspirin EC 81 MG tablet Take 81 mg by mouth daily.   budesonide-formoterol 80-4.5 MCG/ACT inhaler Commonly known as: Symbicort Inhale 2 puffs into the lungs 2 (two) times daily.   carvedilol 25 MG tablet Commonly known as: COREG Take 1 tablet (25 mg total) by mouth 2 (two) times daily with a meal.   clonazePAM 0.5 MG tablet Commonly known as: KLONOPIN Take 0.5 mg by mouth 2 (two) times daily.   Entresto 97-103 MG Generic drug: sacubitril-valsartan Take 1 tablet by mouth 2 (two) times daily.   gabapentin 800 MG tablet Commonly known as: NEURONTIN Take 800 mg by mouth 3 (three) times daily.   hydrALAZINE 25 MG tablet Commonly known as: APRESOLINE Take 1 tablet (25 mg total) by mouth 3 (three) times daily.   insulin glargine 100 UNIT/ML injection Commonly known as: LANTUS Inject 0.35 mLs (35 Units total) into the skin at bedtime. What changed: how much to take   insulin lispro 100 UNIT/ML injection Commonly known as: HUMALOG Inject 10-16 Units  into the skin 3 (three) times daily before meals.   Ipratropium-Albuterol 20-100 MCG/ACT Aers respimat Commonly known as: Combivent Respimat Inhale 1 puff into the lungs every 6 (six) hours as needed for wheezing or shortness of breath.   isosorbide mononitrate 30 MG 24 hr tablet Commonly known as: IMDUR Take 1 tablet  (30 mg total) by mouth daily.   linaclotide 145 MCG Caps capsule Commonly known as: Linzess Take 1 capsule (145 mcg total) by mouth daily as needed (constipation).   liraglutide 18 MG/3ML Sopn Commonly known as: VICTOZA Inject 1.2 mg into the skin daily.   nitroGLYCERIN 0.4 MG SL tablet Commonly known as: Nitrostat Place 1 tablet (0.4 mg total) under the tongue every 5 (five) minutes as needed for chest pain.   ondansetron 8 MG disintegrating tablet Commonly known as: Zofran ODT Take 1 tablet (8 mg total) by mouth every 8 (eight) hours as needed for nausea or vomiting.   potassium chloride SA 20 MEQ tablet Commonly known as: Klor-Con M20 Take 1 tablet (20 mEq total) by mouth daily.   pyridostigmine 60 MG tablet Commonly known as: MESTINON Take 60 mg by mouth every 8 (eight) hours.   spironolactone 25 MG tablet Commonly known as: ALDACTONE Take 0.5 tablets (12.5 mg total) by mouth daily.   torsemide 20 MG tablet Commonly known as: DEMADEX Take 2 tablets (40 mg total) by mouth 2 (two) times daily.   traZODone 100 MG tablet Commonly known as: DESYREL Take 2 tablets (200 mg total) by mouth at bedtime.      Allergies  Allergen Reactions  . Diclofenac Swelling    AND POSSIBLE SYNCOPE; tolerates ibuprofen per pt  . Tramadol Nausea And Vomiting and Nausea Only    Itching (12/21); tolerates ibuprofen per pt  . Vicodin [Hydrocodone-Acetaminophen] Itching and Nausea Only    Follow-up Information    Rosita Fire, MD. Schedule an appointment as soon as possible for a visit in 10 day(s).   Specialty: Internal Medicine Contact information: [place] [address] [phone]        Arnoldo Lenis, MD .   Specialty: Cardiology Contact information: [address] [place] [address] [phone]               The results of significant diagnostics from this hospitalization (including imaging, microbiology, ancillary and  laboratory) are listed below for reference.    Significant Diagnostic Studies: CT ABDOMEN PELVIS WO CONTRAST  Result Date: 10/18/2020 CLINICAL DATA:  Weakness, malaise, abdominal pain. Recent history of acute diverticulitis. EXAM: CT ABDOMEN AND PELVIS WITHOUT CONTRAST TECHNIQUE: Multidetector CT imaging of the abdomen and pelvis was performed following the standard protocol without IV contrast. COMPARISON:  CT abdomen pelvis dated 10/10/2020. FINDINGS: Lower chest: There is mild left lower lobe atelectasis/scarring. The heart remains enlarged. Hepatobiliary: No focal liver abnormality is seen. Status post cholecystectomy. No biliary dilatation. Pancreas: Unremarkable. No pancreatic ductal dilatation or surrounding inflammatory changes. Spleen: Normal in size without focal abnormality. Adrenals/Urinary Tract: Adrenal glands are unremarkable. Kidneys are normal, without renal calculi, focal lesion, or hydronephrosis. Bladder is unremarkable. Stomach/Bowel: Stomach is within normal limits. Inflammatory changes associated with the distal descending colon have decreased since 10/10/2020. On today's exam there is a mild amount of fat stranding in the left pericolic gutter. No evidence of abscess or fistula formation. The appendix appears normal. No evidence of bowel obstruction. Vascular/Lymphatic: No significant vascular findings are present. No enlarged abdominal or pelvic lymph nodes. Reproductive: The patient  is status post hysterectomy. Other: No abdominal wall hernia or abnormality. Musculoskeletal: Degenerative changes are seen in the spine. IMPRESSION: Decreased inflammatory changes of diverticulosis compared to exam dated 10/10/2020. No evidence of abscess. Electronically Signed   By: Zerita Boers M.D.   On: 10/18/2020 19:55   CT ABDOMEN PELVIS WO CONTRAST  Result Date: 10/10/2020 CLINICAL DATA:  45 year old female with history of abdominal distension and left lower quadrant abdominal pain. Vomiting and  abdominal distension for the past 2 days. EXAM: CT ABDOMEN AND PELVIS WITHOUT CONTRAST TECHNIQUE: Multidetector CT imaging of the abdomen and pelvis was performed following the standard protocol without IV contrast. COMPARISON:  CT the abdomen and pelvis 07/04/2019. FINDINGS: Lower chest: Linear scarring or subsegmental atelectasis in the left lower lobe. Cardiomegaly. Calcifications of the mitral annulus. Hepatobiliary: No definite suspicious cystic or solid hepatic lesions are confidently identified on today's noncontrast CT examination. Status post cholecystectomy. Pancreas: No definite pancreatic mass or peripancreatic fluid collections or inflammatory changes are noted on today's noncontrast CT examination. Spleen: Unremarkable. Adrenals/Urinary Tract: Unenhanced appearance of the kidneys and bilateral adrenal glands is normal. No hydroureteronephrosis. Urinary bladder is normal in appearance. Stomach/Bowel: Unenhanced appearance of the stomach is normal. No pathologic dilatation of small bowel or colon. A few scattered colonic diverticulae are noted. In the region of the distal descending colon there are surrounding inflammatory changes indicative of acute diverticulitis. No diverticular abscess or signs of frank perforation are noted at this time. Normal appendix. Vascular/Lymphatic: No atherosclerotic calcifications in the abdominal aorta or pelvic vasculature. No lymphadenopathy noted in the abdomen or pelvis. Reproductive: Status post hysterectomy.  Ovaries are atrophic. Other: No significant volume of ascites.  No pneumoperitoneum. Musculoskeletal: There are no aggressive appearing lytic or blastic lesions noted in the visualized portions of the skeleton. IMPRESSION: 1. Findings are compatible with acute diverticulitis of the distal descending colon. No signs of diverticular abscess or frank perforation are noted at this time. 2. Cardiomegaly. 3. Additional incidental findings, as above. Electronically  Signed   By: Vinnie Langton M.D.   On: 10/10/2020 14:43   DG Chest 2 View  Result Date: 10/08/2020 CLINICAL DATA:  Shortness of breath. EXAM: CHEST - 2 VIEW COMPARISON:  10/07/2020. FINDINGS: Cardiomegaly again noted. Interim near complete clearing of bilateral interstitial prominence suggesting clearing CHF. No pleural effusion or pneumothorax. IMPRESSION: Cardiomegaly again noted. Interim near complete clearing of bilateral interstitial prominence suggesting clearing CHF. Electronically Signed   By: Marcello Moores  Register   On: 10/08/2020 06:00   DG Chest 2 View  Result Date: 10/07/2020 CLINICAL DATA:  Chest pain EXAM: CHEST - 2 VIEW COMPARISON:  09/10/2020 FINDINGS: Cardiomegaly. Vascular pedicle widening. Diffuse interstitial opacity with Kerley lines. No effusion or consolidation. Cholecystectomy clips. IMPRESSION: CHF. Electronically Signed   By: Monte Fantasia M.D.   On: 10/07/2020 11:06   DG Chest Portable 1 View  Result Date: 10/18/2020 CLINICAL DATA:  Chest pain. EXAM: PORTABLE CHEST 1 VIEW COMPARISON:  Oct 10, 2020 FINDINGS: There is no evidence of acute infiltrate, pleural effusion or pneumothorax. The cardiac silhouette is moderately enlarged and unchanged in size. The visualized skeletal structures are unremarkable. IMPRESSION: Stable cardiomegaly without acute or active cardiopulmonary disease. Electronically Signed   By: Virgina Norfolk M.D.   On: 10/18/2020 19:17   DG CHEST PORT 1 VIEW  Result Date: 10/10/2020 CLINICAL DATA:  CHF exacerbation, diabetes mellitus, hypertension EXAM: PORTABLE CHEST 1 VIEW COMPARISON:  Portable exam 0459 hours compared to 10/08/2020 FINDINGS: Enlargement of cardiac silhouette.  Mediastinal contours and pulmonary vascularity normal. Lungs clear. No infiltrate, pleural effusion, or pneumothorax. Osseous structures unremarkable. IMPRESSION: Enlargement of cardiac silhouette. No acute pulmonary edema. Electronically Signed   By: Lavonia Dana M.D.   On:  10/10/2020 08:05    Microbiology: Recent Results (from the past 240 hour(s))  Resp Panel by RT-PCR (Flu A&B, Covid) Nasopharyngeal Swab     Status: None   Collection Time: 10/18/20  8:07 PM   Specimen: Nasopharyngeal Swab; Nasopharyngeal(NP) swabs in vial transport medium  Result Value Ref Range Status   SARS Coronavirus 2 by RT PCR NEGATIVE NEGATIVE Final    Comment: (NOTE) SARS-CoV-2 target nucleic acids are NOT DETECTED.  The SARS-CoV-2 RNA is generally detectable in upper respiratory specimens during the acute phase of infection. The lowest concentration of SARS-CoV-2 viral copies this assay can detect is 138 copies/mL. A negative result does not preclude SARS-Cov-2 infection and should not be used as the sole basis for treatment or other patient management decisions. A negative result may occur with  improper specimen collection/handling, submission of specimen other than nasopharyngeal swab, presence of viral mutation(s) within the areas targeted by this assay, and inadequate number of viral copies(<138 copies/mL). A negative result must be combined with clinical observations, patient history, and epidemiological information. The expected result is Negative.  Fact Sheet for Patients:  EntrepreneurPulse.com.au  Fact Sheet for Healthcare Providers:  IncredibleEmployment.be  This test is no t yet approved or cleared by the [place] FDA and  has been authorized for detection and/or diagnosis of SARS-CoV-2 by FDA under an Emergency Use Authorization (EUA). This EUA will remain  in effect (meaning this test can be used) for the duration of the COVID-19 declaration under Section 564(b)(1) of the Act, 21 U.S.C.section 360bbb-3(b)(1), unless the authorization is terminated  or revoked sooner.       Influenza A by PCR NEGATIVE NEGATIVE Final   Influenza B by PCR NEGATIVE NEGATIVE Final    Comment: (NOTE) The Xpert Xpress  SARS-CoV-2/FLU/RSV plus assay is intended as an aid in the diagnosis of influenza from Nasopharyngeal swab specimens and should not be used as a sole basis for treatment. Nasal washings and aspirates are unacceptable for Xpert Xpress SARS-CoV-2/FLU/RSV testing.  Fact Sheet for Patients: EntrepreneurPulse.com.au  Fact Sheet for Healthcare Providers: IncredibleEmployment.be  This test is not yet approved or cleared by the [place] FDA and has been authorized for detection and/or diagnosis of SARS-CoV-2 by FDA under an Emergency Use Authorization (EUA). This EUA will remain in effect (meaning this test can be used) for the duration of the COVID-19 declaration under Section 564(b)(1) of the Act, 21 U.S.C. section 360bbb-3(b)(1), unless the authorization is terminated or revoked.  Performed at [facility], [address], [place], [place] [address]   C Difficile Quick Screen w PCR reflex     Status: None   Collection Time: 10/19/20  6:00 PM   Specimen: Stool  Result Value Ref Range Status   C Diff antigen NEGATIVE NEGATIVE Final   C Diff toxin NEGATIVE NEGATIVE Final   C Diff interpretation No C. difficile detected.  Final    Comment: Performed at [facility], [address], [place], [place] [address]     Labs: Basic Metabolic Panel: Recent Labs  Lab 10/18/20 1850 10/18/20 2058 10/19/20 0510 10/20/20 0639  NA 135  --  136 137  K 3.6  --  3.3* 3.3*  CL 99  --  99 98  CO2 26  --  29 29  GLUCOSE 211*  --  131* 182*  BUN 28*  --  26* 23*  CREATININE 1.35*  --  1.33* 1.42*  CALCIUM 9.0  --  8.8* 8.6*  MG  --  1.7  --  1.5*   Liver Function Tests: Recent Labs  Lab 10/18/20 1850  AST 28  ALT 37  ALKPHOS 122  BILITOT 0.5  PROT 7.8  ALBUMIN 3.4*   Recent Labs  Lab 10/18/20 1850  LIPASE 27   CBC: Recent Labs  Lab 10/18/20 1850 10/19/20 0510 10/20/20 0639  WBC 8.1 6.9 7.2  NEUTROABS 5.7  --   --   HGB 10.6* 10.2* 11.1*   HCT 34.3* 33.7* 36.7  MCV 73.0* 72.8* 74.1*  PLT 295 291 312   BNP (last 3 results) Recent Labs    10/07/20 1047 10/09/20 0552 10/18/20 1850  BNP 2,225.0* 140.0* 3,754.0*   CBG: Recent Labs  Lab 10/19/20 1208 10/19/20 1655 10/19/20 2140 10/20/20 0748 10/20/20 1107  GLUCAP 128* 134* 166* 153* 117*   Signed:  Barton Dubois MD.  [facility] 10/20/2020, 3:52 PM

## 2020-10-20 NOTE — TOC Progression Note (Signed)
Transition of Care [facility]) - Progression Note    Patient Details  Name: Chelsey Santiago MRN: [id number] Date of Birth: 1975-05-30  [facility] CM/SW Contact  Salome Arnt, [place] Phone Number: 10/20/2020, 8:11 AM  Clinical Narrative:  TOC received consult for CHF screening. CHF screening and education completed on 10/08/20 with pt. TOC will continue to follow for d/c planning needs.           Expected Discharge Plan and Services                                                 Social Determinants of Health (SDOH) Interventions    Readmission Risk Interventions Readmission Risk Prevention Plan 10/13/2020 10/08/2020 06/10/2020  Transportation Screening - Complete Complete  HRI or [place] - - -  Social Work Consult for [facility] - - -  Medication Review Press photographer) - Complete Complete  PCP or Specialist appointment within 3-5 days of discharge Not Complete - -  HRI or [place] - Complete Complete  SW Recovery Care/Counseling Consult - Complete Complete  Palliative Care Screening - Not Applicable Not Applicable  Skilled Nursing Facility - Not Applicable Not Applicable  Some recent data might be hidden

## 2020-10-20 NOTE — Plan of Care (Signed)
  Problem: Education: Goal: Knowledge of General Education information will improve Description: Including pain rating scale, medication(s)/side effects and non-pharmacologic comfort measures 10/20/2020 1503 by Melony Overly, RN Outcome: Adequate for Discharge 10/20/2020 0915 by Melony Overly, RN Outcome: Progressing   Problem: Health Behavior/Discharge Planning: Goal: Ability to manage health-related needs will improve 10/20/2020 1503 by Melony Overly, RN Outcome: Adequate for Discharge 10/20/2020 0915 by Melony Overly, RN Outcome: Progressing   Problem: Clinical Measurements: Goal: Ability to maintain clinical measurements within normal limits will improve 10/20/2020 1503 by Melony Overly, RN Outcome: Adequate for Discharge 10/20/2020 0915 by Melony Overly, RN Outcome: Progressing Goal: Will remain free from infection 10/20/2020 1503 by Melony Overly, RN Outcome: Adequate for Discharge 10/20/2020 0915 by Melony Overly, RN Outcome: Progressing Goal: Diagnostic test results will improve 10/20/2020 1503 by Melony Overly, RN Outcome: Adequate for Discharge 10/20/2020 0915 by Melony Overly, RN Outcome: Progressing Goal: Respiratory complications will improve 10/20/2020 1503 by Melony Overly, RN Outcome: Adequate for Discharge 10/20/2020 0915 by Melony Overly, RN Outcome: Progressing Goal: Cardiovascular complication will be avoided 10/20/2020 1503 by Melony Overly, RN Outcome: Adequate for Discharge 10/20/2020 0915 by Melony Overly, RN Outcome: Progressing   Problem: Activity: Goal: Risk for activity intolerance will decrease 10/20/2020 1503 by Melony Overly, RN Outcome: Adequate for Discharge 10/20/2020 0915 by Melony Overly, RN Outcome: Progressing   Problem: Nutrition: Goal: Adequate nutrition will be maintained 10/20/2020 1503 by Melony Overly, RN Outcome: Adequate for Discharge 10/20/2020 0915 by Melony Overly, RN Outcome: Progressing   Problem:  Coping: Goal: Level of anxiety will decrease 10/20/2020 1503 by Melony Overly, RN Outcome: Adequate for Discharge 10/20/2020 0915 by Melony Overly, RN Outcome: Progressing   Problem: Elimination: Goal: Will not experience complications related to bowel motility 10/20/2020 1503 by Melony Overly, RN Outcome: Adequate for Discharge 10/20/2020 0915 by Melony Overly, RN Outcome: Progressing Goal: Will not experience complications related to urinary retention 10/20/2020 1503 by Melony Overly, RN Outcome: Adequate for Discharge 10/20/2020 0915 by Melony Overly, RN Outcome: Progressing   Problem: Pain Managment: Goal: General experience of comfort will improve 10/20/2020 1503 by Melony Overly, RN Outcome: Adequate for Discharge 10/20/2020 0915 by Melony Overly, RN Outcome: Progressing   Problem: Safety: Goal: Ability to remain free from injury will improve 10/20/2020 1503 by Melony Overly, RN Outcome: Adequate for Discharge 10/20/2020 0915 by Melony Overly, RN Outcome: Progressing   Problem: Skin Integrity: Goal: Risk for impaired skin integrity will decrease 10/20/2020 1503 by Melony Overly, RN Outcome: Adequate for Discharge 10/20/2020 0915 by Melony Overly, RN Outcome: Progressing

## 2020-11-14 ENCOUNTER — Ambulatory Visit: Payer: Medicaid Other | Admitting: Student

## 2020-11-14 NOTE — Progress Notes (Deleted)
Cardiology Office Note    Date:  11/14/2020   ID:  Chelsey Santiago, DOB June 27, 1975, MRN [id number]  PCP:  Rosita Fire, MD  Cardiologist: Carlyle Dolly, MD    No chief complaint on file.   History of Present Illness:    Chelsey Santiago is a 45 y.o. female with past medical history of chronic combined systolic and diastolic CHF/NICM (EF 02-72% by echo in 12/2015 with cath showing normal cors, EF at 45% by repeat echo in 10/2018 and 30-35% in 03/2020), HTN, IDDM and OSA who presents to the office today for hospital follow-up.  She was admitted for hypertensive urgency and acute CHF exacerbation 08/2020 and had been without her torsemide for 7 days leading up to admission.  Repeat echocardiogram showed her EF remained similar to prior imaging at 35% with grade 2 diastolic dysfunction.  She responded well to IV Lasix and was discharged on torsemide 40 mg twice daily.  Was again admitted in 09/2020 for acute diverticulitis and symptoms improved with IV ceftriaxone and metronidazole.  Most recent admission was from 6/4 - 10/20/2020 for intractable nausea and vomiting felt to be due to gastroenteritis/gastroparesis.  Her volume status was felt to be stable by examination as weight on the time of admission was at 224 lbs.  Was continued on her current cardiac medications including Torsemide, Coreg, Entresto, Imdur and Spironolactone.    Past Medical History:  Diagnosis Date   Anemia    H&H of 10.6/33 and 07/2008 and 11.9/35 and 09/2010   Anxiety    Chronic combined systolic and diastolic CHF (congestive heart failure) ([place])    a. EF 40-45% by echo in 12/2015 with cath showing normal cors b. EF at 45% by repeat echo in 10/2018 c. EF at 30-35% in 03/2020   Depression with anxiety    Diabetes mellitus without complication ([place])    Hypertension    Hypertensive heart disease 2009   Pulmonary edema postpartum; mild to moderate mitral regurgitation when hospitalized for CHF in 2009; Echocardiogram  in 12/2009-no MR and normal EF; normal CXR in 09/2010   Migraine headache    Miscarriage 03/19/2013   Obesity 04/16/2009   Osteoarthritis, knee 03/29/2011   Preeclampsia    Pulmonary edema    Sleep apnea    Threatened abortion in early pregnancy 03/15/2013    Past Surgical History:  Procedure Laterality Date   BREAST REDUCTION SURGERY  2002   CARDIAC CATHETERIZATION N/A 12/22/2015   Procedure: Left Heart Cath and Coronary Angiography;  Surgeon: Peter M Martinique, MD;  Location: [facility];  Service: Cardiovascular;  Laterality: N/A;   CESAREAN SECTION N/A 04/09/2014   Procedure: CESAREAN SECTION;  Surgeon: Mora Bellman, MD;  Location: [facility];  Service: Obstetrics;  Laterality: N/A;   CHOLECYSTECTOMY     HYSTERECTOMY ABDOMINAL WITH SALPINGECTOMY N/A 09/26/2019   Procedure: HYSTERECTOMY ABDOMINAL WITH SALPINGECTOMY;  Surgeon: Florian Buff, MD;  Location: [facility];  Service: Gynecology;  Laterality: N/A;   LIPOMA EXCISION Right 09/26/2019   Procedure: EXCISION LIPOMA RIGHT VULVAR;  Surgeon: Florian Buff, MD;  Location: [facility];  Service: Gynecology;  Laterality: Right;    Current Medications: Outpatient Medications Prior to Visit  Medication Sig Dispense Refill   ACCU-CHEK GUIDE test strip USE AS DIRECTED TO TESTCBLOOD SUGAR TWICE DAILY.     albuterol (VENTOLIN HFA) 108 (90 Base) MCG/ACT inhaler Inhale 1 puff into the lungs every 6 (six) hours as needed for wheezing or shortness of breath. 18 g 0  ARIPiprazole (ABILIFY) 10 MG tablet Take 1 tablet (10 mg total) by mouth daily.     aspirin EC 81 MG tablet Take 81 mg by mouth daily.     budesonide-formoterol (SYMBICORT) 80-4.5 MCG/ACT inhaler Inhale 2 puffs into the lungs 2 (two) times daily. 10.2 g 0   carvedilol (COREG) 25 MG tablet Take 1 tablet (25 mg total) by mouth 2 (two) times daily with a meal. 360 tablet 3   clonazePAM (KLONOPIN) 0.5 MG tablet Take 0.5 mg by mouth 2 (two) times daily.     gabapentin (NEURONTIN) 800 MG tablet  Take 800 mg by mouth 3 (three) times daily.     hydrALAZINE (APRESOLINE) 25 MG tablet Take 1 tablet (25 mg total) by mouth 3 (three) times daily. 90 tablet 1   insulin glargine (LANTUS) 100 UNIT/ML injection Inject 0.35 mLs (35 Units total) into the skin at bedtime. 10 mL 11   insulin lispro (HUMALOG) 100 UNIT/ML injection Inject 10-16 Units into the skin 3 (three) times daily before meals.     Ipratropium-Albuterol (COMBIVENT RESPIMAT) 20-100 MCG/ACT AERS respimat Inhale 1 puff into the lungs every 6 (six) hours as needed for wheezing or shortness of breath. 1 Inhaler 0   isosorbide mononitrate (IMDUR) 30 MG 24 hr tablet Take 1 tablet (30 mg total) by mouth daily. 30 tablet 1   linaclotide (LINZESS) 145 MCG CAPS capsule Take 1 capsule (145 mcg total) by mouth daily as needed (constipation). 30 capsule 5   liraglutide (VICTOZA) 18 MG/3ML SOPN Inject 1.2 mg into the skin daily. 9 mL 3   nitroGLYCERIN (NITROSTAT) 0.4 MG SL tablet Place 1 tablet (0.4 mg total) under the tongue every 5 (five) minutes as needed for chest pain. 30 tablet 12   ondansetron (ZOFRAN ODT) 8 MG disintegrating tablet Take 1 tablet (8 mg total) by mouth every 8 (eight) hours as needed for nausea or vomiting. 20 tablet 0   potassium chloride SA (KLOR-CON M20) 20 MEQ tablet Take 1 tablet (20 mEq total) by mouth daily. 180 tablet 3   pyridostigmine (MESTINON) 60 MG tablet Take 60 mg by mouth every 8 (eight) hours.     sacubitril-valsartan (ENTRESTO) 97-103 MG Take 1 tablet by mouth 2 (two) times daily. 60 tablet 6   spironolactone (ALDACTONE) 25 MG tablet Take 0.5 tablets (12.5 mg total) by mouth daily. 30 tablet 1   torsemide (DEMADEX) 20 MG tablet Take 2 tablets (40 mg total) by mouth 2 (two) times daily. 360 tablet 2   traZODone (DESYREL) 100 MG tablet Take 2 tablets (200 mg total) by mouth at bedtime.     No facility-administered medications prior to visit.     Allergies:   Diclofenac, Tramadol, and Vicodin  [hydrocodone-acetaminophen]   Social History   Socioeconomic History   Marital status: Married    Spouse name: Not on file   Number of children: 6   Years of education: Not on file   Highest education level: Not on file  Occupational History   Occupation: unemployed    Employer: UNEMPLOYED  Tobacco Use   Smoking status: Never   Smokeless tobacco: Never  Vaping Use   Vaping Use: Never used  Substance and Sexual Activity   Alcohol use: Yes    Comment: occ   Drug use: No   Sexual activity: Yes    Birth control/protection: Surgical    Comment: hyst  Other Topics Concern   Not on file  Social History Narrative   Lives in [place]  Engaged/boyfriend (father to youngest chid)   4 children: daughter (92 as of 2013), sons (57, 43, 80 as of 2013)   Religion: christian   Social Determinants of Radio broadcast assistant Strain: Not on Art therapist Insecurity: Not on file  Transportation Needs: Not on file  Physical Activity: Not on file  Stress: Not on file  Social Connections: Not on file     Family History:  The patient's ***family history includes ADD / ADHD in her son; Diabetes (age of onset: 6) in her mother; Heart attack in her brother; Heart disease in her father and mother; Heart disease (age of onset: 88) in her maternal grandmother; Hyperlipidemia in her paternal grandfather; Hypertension in her father, maternal uncle, and paternal grandfather.   Review of Systems:    Please see the history of present illness.     All other systems reviewed and are otherwise negative except as noted above.   Physical Exam:    VS:  LMP 08/21/2019    General: Well developed, well nourished,female appearing in no acute distress. Head: Normocephalic, atraumatic. Neck: No carotid bruits. JVD not elevated.  Lungs: Respirations regular and unlabored, without wheezes or rales.  Heart: ***Regular rate and rhythm. No S3 or S4.  No murmur, no rubs, or gallops appreciated. Abdomen: Appears  non-distended. No obvious abdominal masses. Msk:  Strength and tone appear normal for age. No obvious joint deformities or effusions. Extremities: No clubbing or cyanosis. No edema.  Distal pedal pulses are 2+ bilaterally. Neuro: Alert and oriented X 3. Moves all extremities spontaneously. No focal deficits noted. Psych:  Responds to questions appropriately with a normal affect. Skin: No rashes or lesions noted  Wt Readings from Last 3 Encounters:  10/18/20 224 lb 6.9 oz (101.8 kg)  10/13/20 224 lb 1.6 oz (101.7 kg)  09/13/20 224 lb 10.4 oz (101.9 kg)        Studies/Labs Reviewed:   EKG:  EKG is*** ordered today.  The ekg ordered today demonstrates ***  Recent Labs: 08/29/2020: TSH 0.82 10/18/2020: ALT 37; B Natriuretic Peptide 3,754.0 10/20/2020: BUN 23; Creatinine, Ser 1.42; Hemoglobin 11.1; Magnesium 1.5; Platelets 312; Potassium 3.3; Sodium 137   Lipid Panel    Component Value Date/Time   CHOL 211 (H) 06/03/2018 0615   TRIG 223 (H) 06/03/2018 0615   HDL 39 (L) 06/03/2018 0615   CHOLHDL 5.4 06/03/2018 0615   VLDL 45 (H) 06/03/2018 0615   LDLCALC 127 (H) 06/03/2018 0615    Additional studies/ records that were reviewed today include:   Cardiac Catheterization: 12/2015 The left ventricular systolic function is normal. LV end diastolic pressure is mildly elevated. The left ventricular ejection fraction is 50-55% by visual estimate.   1. No significant CAD 2. Good LV function 3. Mildly elevated LV EDP.   Plan: medical therapy  Echocardiogram: 08/2020 IMPRESSIONS     1. Left ventricular ejection fraction, by estimation, is approximately  35%. The left ventricle has moderately decreased function. The left  ventricle demonstrates global hypokinesis. There is moderate asymmetric  left ventricular hypertrophy of the  posterior segment. Left ventricular diastolic parameters are consistent  with Grade II diastolic dysfunction (pseudonormalization).   2. Right ventricular  systolic function is normal. The right ventricular  size is normal. There is mildly elevated pulmonary artery systolic  pressure. The estimated right ventricular systolic pressure is 28.4 mmHg.   3. Left atrial size was severely dilated.   4. Right atrial size was moderately dilated.   5.  A small pericardial effusion is present. The pericardial effusion is  posterior to the left ventricle and lateral to the left ventricle.   6. The mitral valve is grossly normal. Trivial mitral valve  regurgitation.   7. The aortic valve is tricuspid. Aortic valve regurgitation is not  visualized. Mild to moderate aortic valve sclerosis/calcification is  present, without any evidence of aortic stenosis. Aortic valve mean  gradient measures 9.0 mmHg.   8. The inferior vena cava is dilated in size with <50% respiratory  variability, suggesting right atrial pressure of 15 mmHg.   Assessment:    No diagnosis found.   Plan:   In order of problems listed above:  ***    Shared Decision Making/Informed Consent:   {Are you ordering a CV Procedure (e.g. stress test, cath, DCCV, TEE, etc)?   Press F2        :[id number]}    Medication Adjustments/Labs and Tests Ordered: Current medicines are reviewed at length with the patient today.  Concerns regarding medicines are outlined above.  Medication changes, Labs and Tests ordered today are listed in the Patient Instructions below. There are no Patient Instructions on file for this visit.   Signed, Erma Heritage, PA-C  11/14/2020 12:02 PM    [facility] [address] [address] [place], [place] [address] Phone: [phone] Fax: [phone]

## 2020-12-05 ENCOUNTER — Ambulatory Visit: Payer: Medicaid Other | Admitting: Student

## 2020-12-08 ENCOUNTER — Ambulatory Visit: Payer: Medicaid Other | Admitting: Nutrition

## 2021-01-07 NOTE — Progress Notes (Signed)
Referring Provider: Murlean Iba, MD Primary Care Physician:  Rosita Fire, MD Primary GI Physician: Dr. Abbey Chatters  Chief Complaint  Patient presents with   Abdominal Pain    Left abd occ.  Occ mucus in stool   Dysphagia    Feels like food/liquids sit in mid lower chest. Heaves, no vomiting   dark stool    occ    HPI:   Chelsey Santiago is a 45 y.o. female presenting today upon referral from Dr. Wynetta Emery [facility] hospitalist) for acute diverticulitis.  Referral was placed in May 2022.  CT A/P without contrast 99991111 with uncomplicated acute diverticulitis of the distal descending colon.  This was diagnosed during a hospitalization for acute on chronic congestive heart failure.  She was treated with IV ceftriaxone and metronidazole while inpatient and discharged on Augmentin to complete a 10-day course.  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 pylori s/p treatment, history of postprandial upper abdominal pain with associated nausea without vomiting, dysphagia, GERD, alternating constipation diarrhea with associated lower abdominal pain with constipation.  Previously attempted to set up EGD and colonoscopy in 2019, but we were unable to reach patient once cardiac clearance was obtained.  Hoped to arrange procedures again in 2020 we last saw her for follow-up, but requested she follow-up with cardiology to obtain clearance due to frequent hospitalizations for acute on chronic CHF and uncontrolled hypertension.  We were holding a spot for her in February 2021.  Unfortunately, she did not keep her follow-up with cardiology prior to this, thus procedures were canceled.  She no showed her follow-up with [place] in February 2022.  Patient was again hospitalized April and May 2022 with acute on chronic CHF.  Hospitalized June 2022 with hypertensive  urgency.  Last saw cardiology just prior to her hospitalization in April 2022.  Most recent hemoglobin 11.1 on 10/20/2020, consistent with baseline.   Today: Diagnosed with diverticulitis in May 2022.  She completed the course of antibiotics prescribed by the hospital.  Abdominal pain improved, but seems to return intermittently.  States she has "flares" of left-sided abdominal pain with associated diarrhea lasting a couple days at a time and self resolving.  Describes the pain as a burning sensation, occasionally sharp.  No significant nausea, but feels when she eats, it may worsen the pain.  She usually follows a bland/soft diet during this time.  Diarrhea can be uncontrollable.  Associated nocturnal bowel movements.  Tends to notice mucus in her stools when she has diarrhea.  Occasionally, she will take Pepto during these flares.  She is not sure if this helps or not.  Denies bright red blood per rectum.  Occasional dark stools.  Does not take iron. Completed stool testing in June for similar symptoms with C. difficile and GI pathogen panel negative.  Thinks the abdominal pain may be triggered by greasy/oily foods.   Aside from intermittent flares of abdominal pain, she usually has constipation/incomplete bowel movements.  Uses Linzess 145 mcg as needed but this also causes diarrhea.  Her last flare was over the weekend.  No pain at this time.  Stools are currently solid.   Also reports feeling that food just sits in her lower chest/upper abdomen.  Typically occurs with solid foods.  If drinking liquids behind it, she feels this also just sits there.  Associated nausea without vomiting.  States she  will try to bring the items back up, but cannot.  Eventually, they will pass on their own.  Denies heartburn symptoms.  Occasional Goody powder for headache.  No routine.   Regarding history of CHF, she feels she is at baseline.  Denies shortness of breath, cough, chest pain, palpitations, or  peripheral edema.  States she usually ends up in the hospital if she runs out of her medications for a few days.  She has follow-up with cardiology on 9/21.  Past Medical History:  Diagnosis Date   Anemia    H&H of 10.6/33 and 07/2008 and 11.9/35 and 09/2010   Anxiety    Chronic combined systolic and diastolic CHF (congestive heart failure) ([place])    a. EF 40-45% by echo in 12/2015 with cath showing normal cors b. EF at 45% by repeat echo in 10/2018 c. EF at 30-35% in 03/2020   Depression with anxiety    Diabetes mellitus without complication ([place])    Diverticulitis    09/2020   Hypertension    Hypertensive heart disease 2009   Pulmonary edema postpartum; mild to moderate mitral regurgitation when hospitalized for CHF in 2009; Echocardiogram in 12/2009-no MR and normal EF; normal CXR in 09/2010   Migraine headache    Miscarriage 03/19/2013   Obesity 04/16/2009   Osteoarthritis, knee 03/29/2011   Preeclampsia    Pulmonary edema    Sleep apnea    Threatened abortion in early pregnancy 03/15/2013    Past Surgical History:  Procedure Laterality Date   BREAST REDUCTION SURGERY  2002   CARDIAC CATHETERIZATION N/A 12/22/2015   Procedure: Left Heart Cath and Coronary Angiography;  Surgeon: Peter M Martinique, MD;  Location: [place] [facility];  Service: Cardiovascular;  Laterality: N/A;   CESAREAN SECTION N/A 04/09/2014   Procedure: CESAREAN SECTION;  Surgeon: Mora Bellman, MD;  Location: [place] [facility];  Service: Obstetrics;  Laterality: N/A;   CHOLECYSTECTOMY     HYSTERECTOMY ABDOMINAL WITH SALPINGECTOMY N/A 09/26/2019   Procedure: HYSTERECTOMY ABDOMINAL WITH SALPINGECTOMY;  Surgeon: Florian Buff, MD;  Location: [facility];  Service: Gynecology;  Laterality: N/A;   LIPOMA EXCISION Right 09/26/2019   Procedure: EXCISION LIPOMA RIGHT VULVAR;  Surgeon: Florian Buff, MD;  Location: [facility];  Service: Gynecology;  Laterality: Right;    Current Outpatient Medications  Medication Sig Dispense Refill    ACCU-CHEK GUIDE test strip USE AS DIRECTED TO TESTCBLOOD SUGAR TWICE DAILY.     albuterol (VENTOLIN HFA) 108 (90 Base) MCG/ACT inhaler Inhale 1 puff into the lungs every 6 (six) hours as needed for wheezing or shortness of breath. 18 g 0   ARIPiprazole (ABILIFY) 10 MG tablet Take 1 tablet (10 mg total) by mouth daily.     aspirin EC 81 MG tablet Take 81 mg by mouth daily.     budesonide-formoterol (SYMBICORT) 80-4.5 MCG/ACT inhaler Inhale 2 puffs into the lungs 2 (two) times daily. 10.2 g 0   carvedilol (COREG) 25 MG tablet Take 1 tablet (25 mg total) by mouth 2 (two) times daily with a meal. 360 tablet 3   clonazePAM (KLONOPIN) 0.5 MG tablet Take 0.5 mg by mouth 2 (two) times daily.     gabapentin (NEURONTIN) 800 MG tablet Take 800 mg by mouth 3 (three) times daily.     hydrALAZINE (APRESOLINE) 100 MG tablet Take 100 mg by mouth 3 (three) times daily.     insulin glargine (LANTUS) 100 UNIT/ML injection Inject 0.35 mLs (35 Units total) into the skin at  bedtime. 10 mL 11   insulin lispro (HUMALOG) 100 UNIT/ML injection Inject 10-16 Units into the skin 3 (three) times daily before meals.     Ipratropium-Albuterol (COMBIVENT RESPIMAT) 20-100 MCG/ACT AERS respimat Inhale 1 puff into the lungs every 6 (six) hours as needed for wheezing or shortness of breath. 1 Inhaler 0   isosorbide mononitrate (IMDUR) 30 MG 24 hr tablet Take 1 tablet (30 mg total) by mouth daily. 30 tablet 1   liraglutide (VICTOZA) 18 MG/3ML SOPN Inject 1.2 mg into the skin daily. 9 mL 3   nitroGLYCERIN (NITROSTAT) 0.4 MG SL tablet Place 1 tablet (0.4 mg total) under the tongue every 5 (five) minutes as needed for chest pain. 30 tablet 12   ondansetron (ZOFRAN ODT) 8 MG disintegrating tablet Take 1 tablet (8 mg total) by mouth every 8 (eight) hours as needed for nausea or vomiting. 20 tablet 0   potassium chloride SA (KLOR-CON M20) 20 MEQ tablet Take 1 tablet (20 mEq total) by mouth daily. 180 tablet 3   pyridostigmine (MESTINON) 60 MG  tablet Take 60 mg by mouth every 8 (eight) hours.     sacubitril-valsartan (ENTRESTO) 97-103 MG Take 1 tablet by mouth 2 (two) times daily. 60 tablet 6   spironolactone (ALDACTONE) 25 MG tablet Take 0.5 tablets (12.5 mg total) by mouth daily. 30 tablet 1   torsemide (DEMADEX) 20 MG tablet Take 2 tablets (40 mg total) by mouth 2 (two) times daily. 360 tablet 2   traZODone (DESYREL) 100 MG tablet Take 2 tablets (200 mg total) by mouth at bedtime.     No current facility-administered medications for this visit.    Allergies as of 01/08/2021 - Review Complete 01/08/2021  Allergen Reaction Noted   Diclofenac Swelling 07/12/2011   Tramadol Nausea And Vomiting and Nausea Only 05/29/2011   Vicodin [hydrocodone-acetaminophen] Itching and Nausea Only 05/29/2011    Family History  Problem Relation Age of Onset   Diabetes Mother 64   Heart disease Mother    Hyperlipidemia Paternal Grandfather    Hypertension Paternal Grandfather    Heart disease Father    Hypertension Father    Heart disease Maternal Grandmother 75   ADD / ADHD Son    Hypertension Maternal Uncle    Heart attack Brother    Sudden death Neg Hx    Colon cancer Neg Hx    Celiac disease Neg Hx    Inflammatory bowel disease Neg Hx     Social History   Socioeconomic History   Marital status: Married    Spouse name: Not on file   Number of children: 6   Years of education: Not on file   Highest education level: Not on file  Occupational History   Occupation: unemployed    Employer: UNEMPLOYED  Tobacco Use   Smoking status: Never   Smokeless tobacco: Never  Vaping Use   Vaping Use: Never used  Substance and Sexual Activity   Alcohol use: Yes    Comment: occ   Drug use: No   Sexual activity: Yes    Birth control/protection: Surgical    Comment: hyst  Other Topics Concern   Not on file  Social History Narrative   Lives in [place]   Engaged/boyfriend (father to youngest chid)   4 children: daughter (13 as of 2013),  sons (108, 84, 38 as of 2013)   Religion: christian   Social Determinants of Radio broadcast assistant Strain: Not on file  Food Insecurity: Not on  file  Transportation Needs: Not on file  Physical Activity: Not on file  Stress: Not on file  Social Connections: Not on file    Review of Systems: Gen: Denies fever, chills, cold or flulike symptoms, presyncope, syncope. CV: Denies chest pain, palpitations. Resp: Denies dyspnea or cough GI: See HPI Heme: See HPI  Physical Exam: BP (!) 168/96   Pulse (!) 106   Temp (!) 97.3 F (36.3 C) (Temporal)   Ht '5\' 6"'$  (1.676 m)   Wt 223 lb 3.2 oz (101.2 kg)   LMP 08/21/2019   BMI 36.03 kg/m  General:   Alert and oriented. No distress noted. Pleasant and cooperative.  Head:  Normocephalic and atraumatic. Eyes:  Conjuctiva clear without scleral icterus. Heart:  S1, S2 present without murmurs appreciated. Lungs:  Clear to auscultation bilaterally. No wheezes, rales, or rhonchi. No distress.  Abdomen:  +BS, soft, non-tender and non-distended. No rebound or guarding. No HSM or masses noted. Msk:  Symmetrical without gross deformities. Normal posture. Extremities:  Without edema. Neurologic:  Alert and  oriented x4 Psych: Normal mood and affect.    Assessment: 45 year old female with history of CHF with a EF 35%, HTN, diabetes, IDA, alternating constipation and diarrhea, diverticulitis May 2022 presenting today for follow-up of diverticulitis with chief complaint of intermittent left-sided abdominal pain, alternating constipation and diarrhea, and ?dysphagia.   Intermittent left-sided abdominal pain with associated uncontrollable diarrhea occurring about once a week and lasting a couple of days since episode of uncomplicated distal descending colon diverticulitis in May 2022 that was treated inpatient with IV Cipro and Flagyl, transition to Augmentin at discharge for total of 10 days of antibiotics.  During these flares, she reduces her diet  and symptoms seem to spontaneously resolve though she does occasionally take Pepto-Bismol during this time.  Aside from these flares, she denies abdominal pain and states she is usually on the constipated side using Linzess 145 mcg as needed which is actually too strong.  Completed stool studies in June with C. difficile and GI pathogen panel negative.  No prior colonoscopy.  No family history of colon cancer or IBD.  No abdominal pain TTP on exam today.  Doubt persistent diverticulitis.  Query component of IBS mixed type versus underlying IBD.  I recommended colonoscopy for further evaluation.  Due to recurrent hospitalizations earlier this year for acute on chronic CHF and hypertensive urgency, we will plan to schedule colonoscopy after she has follow-up with cardiology on 9/21.  We are requesting cardiac clearance.  In the meantime, she will stop Linzess and use MiraLAX as needed for constipation and Imodium as needed for diarrhea.  Also advised to follow a low-fat diet as she has history of cholecystectomy.  ?Dysphagia/upper abdominal fullness: Patient reports sensation of solid foods sitting in her lower chest/upper abdomen.  If trying to drink liquids behind this, feels the did not go down either.  Eventually, items will pass on their own as she is unable to bring them back up.  She has associated nausea without vomiting.  Denies GERD symptoms.  Query distal esophageal ring/stricture.  Could also have gastritis or gastroparesis in the setting of diabetes.  Recommended EGD for further evaluation.  She has follow-up with cardiology on 9/21.  We are requesting cardiac clearance.  IDA: Chronic.  Hemoglobin typically in the 9-11 range. Previously thought menorrhagia was contributing, now s/p hysterectomy in May 2021. Most recent hemoglobin 11.1 in June 2022.  Iron panel May 2022 with iron 25 (L), saturation  7% (L), ferritin 34.  Folate and vitamin B12 within normal limits. Notably, creatinine has been  elevated recently and may be contributing.  Previously received iron infusions with hematology, but not since 2019 and is not currently taking iron.  Denies BRBPR.  Intermittent dark stools in the setting of Pepto-Bismol.  No prior EGD or colonoscopy.  GI symptoms as per above. Denies regular use of NSAIDs aside from 81 mg aspirin though she does occasionally take a BC powder.  Differential is broad. Kidney disease may be contributing, but need to evaluate for gastritis, duodenitis, PUD, colon polyps, IBD, malignancy.  Could also have AVMs.  Notably, patient has also previously given history of H. pylori in 2020 at [facility] s/p treatment with antibiotics with no confirmed eradication.  I recommended EGD with gastric biopsies and colonoscopy for further evaluation which will be arranged once cardiac clearance is obtained.   Plan: Requested to hold a spot for EGD with gastric biopsies, possible dilation and colonoscopy with propofol with Dr. Abbey Chatters.  Procedures to be arranged pending cardiac clearance following cardiology follow-up on 9/21. The risks, benefits, and alternatives have been discussed with the patient in detail. The patient states understanding and desires to proceed.  ASA III See separate instructions for diabetes medication adjustments. Hold iron x7 days prior to procedure. Stopped Linzess. MiraLAX 17 g as needed for constipation. Imodium as needed for diarrhea. Low-fat diet. Start 325 mg ferrous sulfate daily. Follow-up after procedures.    Aliene Altes, PA-C [facility] 01/08/2021

## 2021-01-08 ENCOUNTER — Other Ambulatory Visit: Payer: Self-pay

## 2021-01-08 ENCOUNTER — Encounter: Payer: Self-pay | Admitting: Gastroenterology

## 2021-01-08 ENCOUNTER — Telehealth: Payer: Self-pay | Admitting: Gastroenterology

## 2021-01-08 ENCOUNTER — Ambulatory Visit: Payer: Medicaid Other | Admitting: Gastroenterology

## 2021-01-08 VITALS — BP 168/96 | HR 106 | Temp 97.3°F | Ht 66.0 in | Wt 223.2 lb

## 2021-01-08 DIAGNOSIS — D509 Iron deficiency anemia, unspecified: Secondary | ICD-10-CM | POA: Diagnosis not present

## 2021-01-08 DIAGNOSIS — R1032 Left lower quadrant pain: Secondary | ICD-10-CM

## 2021-01-08 DIAGNOSIS — R1310 Dysphagia, unspecified: Secondary | ICD-10-CM

## 2021-01-08 DIAGNOSIS — R198 Other specified symptoms and signs involving the digestive system and abdomen: Secondary | ICD-10-CM

## 2021-01-08 DIAGNOSIS — Z8719 Personal history of other diseases of the digestive system: Secondary | ICD-10-CM

## 2021-01-08 NOTE — Telephone Encounter (Signed)
Will send for cardiac clearance closer to pt's cardiology appt in Sept.

## 2021-01-08 NOTE — Patient Instructions (Signed)
We are going to hold a spot for you for a colonoscopy and upper endoscopy with possible stretching of your esophagus with Dr. Abbey Chatters.  Getting these procedures officially scheduled will depend upon clearance from the cardiologist.  It is important you keep your follow-up appointment on 9/21.  Follow a low-fat diet.  Avoid fried, fatty, greasy foods.  Use Imodium as needed for diarrhea.  Stop Linzess and use MiraLAX 17 g in 8 ounces of water as needed for constipation.  You can take this daily if needed.  Monitor for return of abdominal pain that is persistent and let me know if this occurs as we will need to repeat a CT scan for you.  You may start iron (325 mg of ferrous sulfate) daily due to iron deficiency anemia.  Iron may cause her stools to become dark and may also contribute to constipation.  We will plan to see back in the office after your procedures.  Do not hesitate to call if you have any questions or concerns prior to your next visit.  Was good to see you today!  Aliene Altes, PA-C [facility]

## 2021-02-02 ENCOUNTER — Emergency Department (HOSPITAL_COMMUNITY): Payer: Medicaid Other

## 2021-02-02 ENCOUNTER — Encounter (HOSPITAL_COMMUNITY): Payer: Self-pay | Admitting: *Deleted

## 2021-02-02 ENCOUNTER — Emergency Department (HOSPITAL_COMMUNITY)
Admission: EM | Admit: 2021-02-02 | Discharge: 2021-02-02 | Disposition: A | Discharge status: Home / Self Care | Payer: Medicaid Other | Attending: Emergency Medicine | Admitting: Emergency Medicine

## 2021-02-02 ENCOUNTER — Other Ambulatory Visit: Payer: Self-pay

## 2021-02-02 DIAGNOSIS — R059 Cough, unspecified: Secondary | ICD-10-CM | POA: Diagnosis present

## 2021-02-02 DIAGNOSIS — I5042 Chronic combined systolic (congestive) and diastolic (congestive) heart failure: Secondary | ICD-10-CM | POA: Insufficient documentation

## 2021-02-02 DIAGNOSIS — Z794 Long term (current) use of insulin: Secondary | ICD-10-CM | POA: Insufficient documentation

## 2021-02-02 DIAGNOSIS — E119 Type 2 diabetes mellitus without complications: Secondary | ICD-10-CM | POA: Diagnosis not present

## 2021-02-02 DIAGNOSIS — I110 Hypertensive heart disease with heart failure: Secondary | ICD-10-CM | POA: Diagnosis not present

## 2021-02-02 DIAGNOSIS — Z7982 Long term (current) use of aspirin: Secondary | ICD-10-CM | POA: Insufficient documentation

## 2021-02-02 DIAGNOSIS — Z79899 Other long term (current) drug therapy: Secondary | ICD-10-CM | POA: Diagnosis not present

## 2021-02-02 DIAGNOSIS — U071 COVID-19: Secondary | ICD-10-CM

## 2021-02-02 LAB — CBC WITH DIFFERENTIAL/PLATELET
Abs Immature Granulocytes: 0.08 10*3/uL — ABNORMAL HIGH (ref 0.00–0.07)
Basophils Absolute: 0 10*3/uL (ref 0.0–0.1)
Basophils Relative: 0 %
Eosinophils Absolute: 0 10*3/uL (ref 0.0–0.5)
Eosinophils Relative: 0 %
HCT: 35.4 % — ABNORMAL LOW (ref 36.0–46.0)
Hemoglobin: 11.1 g/dL — ABNORMAL LOW (ref 12.0–15.0)
Immature Granulocytes: 1 %
Lymphocytes Relative: 19 %
Lymphs Abs: 1.8 10*3/uL (ref 0.7–4.0)
MCH: 22.7 pg — ABNORMAL LOW (ref 26.0–34.0)
MCHC: 31.4 g/dL (ref 30.0–36.0)
MCV: 72.4 fL — ABNORMAL LOW (ref 80.0–100.0)
Monocytes Absolute: 0.5 10*3/uL (ref 0.1–1.0)
Monocytes Relative: 6 %
Neutro Abs: 6.7 10*3/uL (ref 1.7–7.7)
Neutrophils Relative %: 74 %
Platelets: 259 10*3/uL (ref 150–400)
RBC: 4.89 MIL/uL (ref 3.87–5.11)
RDW: 14.9 % (ref 11.5–15.5)
WBC: 9.1 10*3/uL (ref 4.0–10.5)
nRBC: 0 % (ref 0.0–0.2)

## 2021-02-02 LAB — TROPONIN I (HIGH SENSITIVITY)
Troponin I (High Sensitivity): 18 ng/L — ABNORMAL HIGH (ref <18)
Troponin I (High Sensitivity): 19 ng/L — ABNORMAL HIGH (ref <18)

## 2021-02-02 LAB — BASIC METABOLIC PANEL
Anion gap: 10 (ref 5–15)
BUN: 15 mg/dL (ref 6–20)
CO2: 28 mmol/L (ref 22–32)
Calcium: 8.3 mg/dL — ABNORMAL LOW (ref 8.9–10.3)
Chloride: 98 mmol/L (ref 98–111)
Creatinine, Ser: 0.97 mg/dL (ref 0.44–1.00)
GFR, Estimated: >60 mL/min (ref >60)
Glucose, Bld: 145 mg/dL — ABNORMAL HIGH (ref 70–99)
Potassium: 3.1 mmol/L — ABNORMAL LOW (ref 3.5–5.1)
Sodium: 136 mmol/L (ref 135–145)

## 2021-02-02 LAB — BRAIN NATRIURETIC PEPTIDE: B Natriuretic Peptide: 2318 pg/mL — ABNORMAL HIGH (ref 0.0–100.0)

## 2021-02-02 LAB — RESP PANEL BY RT-PCR (FLU A&B, COVID) ARPGX2
Influenza A by PCR: NEGATIVE
Influenza B by PCR: NEGATIVE
SARS Coronavirus 2 by RT PCR: POSITIVE — AB

## 2021-02-02 LAB — CBG MONITORING, ED: Glucose-Capillary: 167 mg/dL — ABNORMAL HIGH (ref 70–99)

## 2021-02-02 MED ORDER — HYDRALAZINE HCL 25 MG PO TABS
100.0000 mg | ORAL_TABLET | Freq: Once | ORAL | Status: AC
  Administered 2021-02-02: 100 mg via ORAL
  Filled 2021-02-02: qty 4

## 2021-02-02 MED ORDER — ACETAMINOPHEN 325 MG PO TABS
650.0000 mg | ORAL_TABLET | Freq: Once | ORAL | Status: AC
  Administered 2021-02-02: 650 mg via ORAL
  Filled 2021-02-02: qty 2

## 2021-02-02 MED ORDER — BENZONATATE 100 MG PO CAPS: 100.0000 mg | ORAL_CAPSULE | Freq: Three times a day (TID) | ORAL | 0 refills | Status: DC

## 2021-02-02 NOTE — ED Provider Notes (Signed)
[facility] EMERGENCY DEPARTMENT Provider Note  CSN: [id number] Arrival date & time: 02/02/21 [id number]    History Chief Complaint  Patient presents with  . Cough    Chelsey Santiago is a 45 y.o. female with complex PMH including HTN, DM, CHF, reports 10 days of cough productive of white frothy sputum, mild SOB, chest soreness and headache. Does not think she has had a fever. She states cough is worse at night, but no orthopnea or DOE. No known Covid exposure. She has been taking OTC meds with minimal improvement. She denies history of asthma or COPD but states she has inhalers 'for her CHF' but they have also not been effective.    Past Medical History:  Diagnosis Date  . Anemia    H&H of 10.6/33 and 07/2008 and 11.9/35 and 09/2010  . Anxiety   . Chronic combined systolic and diastolic CHF (congestive heart failure) (HCC)    a. EF 40-45% by echo in 12/2015 with cath showing normal cors b. EF at 45% by repeat echo in 10/2018 c. EF at 30-35% in 03/2020  . Depression with anxiety   . Diabetes mellitus without complication ([place])   . Diverticulitis    09/2020  . Hypertension   . Hypertensive heart disease 2009   Pulmonary edema postpartum; mild to moderate mitral regurgitation when hospitalized for CHF in 2009; Echocardiogram in 12/2009-no MR and normal EF; normal CXR in 09/2010  . Migraine headache   . Miscarriage 03/19/2013  . Obesity 04/16/2009  . Osteoarthritis, knee 03/29/2011  . Preeclampsia   . Pulmonary edema   . Sleep apnea   . Threatened abortion in early pregnancy 03/15/2013    Past Surgical History:  Procedure Laterality Date  . BREAST REDUCTION SURGERY  2002  . CARDIAC CATHETERIZATION N/A 12/22/2015   Procedure: Left Heart Cath and Coronary Angiography;  Surgeon: Peter M Martinique, MD;  Location: [facility];  Service: Cardiovascular;  Laterality: N/A;  . CESAREAN SECTION N/A 04/09/2014   Procedure: CESAREAN SECTION;  Surgeon: Mora Bellman, MD;  Location: [facility];   Service: Obstetrics;  Laterality: N/A;  . CHOLECYSTECTOMY    . HYSTERECTOMY ABDOMINAL WITH SALPINGECTOMY N/A 09/26/2019   Procedure: HYSTERECTOMY ABDOMINAL WITH SALPINGECTOMY;  Surgeon: Florian Buff, MD;  Location: [facility];  Service: Gynecology;  Laterality: N/A;  . LIPOMA EXCISION Right 09/26/2019   Procedure: EXCISION LIPOMA RIGHT VULVAR;  Surgeon: Florian Buff, MD;  Location: [facility];  Service: Gynecology;  Laterality: Right;    Family History  Problem Relation Age of Onset  . Diabetes Mother 52  . Heart disease Mother   . Hyperlipidemia Paternal Grandfather   . Hypertension Paternal Grandfather   . Heart disease Father   . Hypertension Father   . Heart disease Maternal Grandmother 60  . ADD / ADHD Son   . Hypertension Maternal Uncle   . Heart attack Brother   . Sudden death Neg Hx   . Colon cancer Neg Hx   . Celiac disease Neg Hx   . Inflammatory bowel disease Neg Hx     Social History   Tobacco Use  . Smoking status: Never  . Smokeless tobacco: Never  Vaping Use  . Vaping Use: Never used  Substance Use Topics  . Alcohol use: Yes    Comment: occ  . Drug use: No     Home Medications Prior to Admission medications   Medication Sig Start Date End Date Taking? Authorizing Provider  benzonatate (TESSALON) 100 MG  capsule Take 1 capsule (100 mg total) by mouth every 8 (eight) hours. 02/02/21  Yes Truddie Hidden, MD  ACCU-CHEK GUIDE test strip USE AS DIRECTED TO TESTCBLOOD SUGAR TWICE DAILY. 06/11/20   [provider]  albuterol (VENTOLIN HFA) 108 (90 Base) MCG/ACT inhaler Inhale 1 puff into the lungs every 6 (six) hours as needed for wheezing or shortness of breath. 03/30/20   Kathie Dike, MD  ARIPiprazole (ABILIFY) 10 MG tablet Take 1 tablet (10 mg total) by mouth daily. 10/30/18   Barton Dubois, MD  aspirin EC 81 MG tablet Take 81 mg by mouth daily.    [provider]  budesonide-formoterol (SYMBICORT) 80-4.5 MCG/ACT inhaler Inhale 2 puffs into  the lungs 2 (two) times daily. 03/30/20   Kathie Dike, MD  carvedilol (COREG) 25 MG tablet Take 1 tablet (25 mg total) by mouth 2 (two) times daily with a meal. 10/13/20   Johnson, Clanford L, MD  clonazePAM (KLONOPIN) 0.5 MG tablet Take 0.5 mg by mouth 2 (two) times daily. 12/19/18   [provider]  gabapentin (NEURONTIN) 800 MG tablet Take 800 mg by mouth 3 (three) times daily. 09/01/20   [provider]  hydrALAZINE (APRESOLINE) 100 MG tablet Take 100 mg by mouth 3 (three) times daily. 01/06/21   [provider]  insulin glargine (LANTUS) 100 UNIT/ML injection Inject 0.35 mLs (35 Units total) into the skin at bedtime. 10/20/20   Barton Dubois, MD  insulin lispro (HUMALOG) 100 UNIT/ML injection Inject 10-16 Units into the skin 3 (three) times daily before meals.    [provider]  Ipratropium-Albuterol (COMBIVENT RESPIMAT) 20-100 MCG/ACT AERS respimat Inhale 1 puff into the lungs every 6 (six) hours as needed for wheezing or shortness of breath. 05/04/18   Manuella Ghazi, Pratik D, DO  isosorbide mononitrate (IMDUR) 30 MG 24 hr tablet Take 1 tablet (30 mg total) by mouth daily. 10/14/20   Johnson, Clanford L, MD  liraglutide (VICTOZA) 18 MG/3ML SOPN Inject 1.2 mg into the skin daily. 09/04/20   Brita Romp, NP  nitroGLYCERIN (NITROSTAT) 0.4 MG SL tablet Place 1 tablet (0.4 mg total) under the tongue every 5 (five) minutes as needed for chest pain. 09/05/15   Kathie Dike, MD  ondansetron (ZOFRAN ODT) 8 MG disintegrating tablet Take 1 tablet (8 mg total) by mouth every 8 (eight) hours as needed for nausea or vomiting. 10/19/20   Kathie Dike, MD  potassium chloride SA (KLOR-CON M20) 20 MEQ tablet Take 1 tablet (20 mEq total) by mouth daily. 10/13/20 01/11/21  Johnson, Clanford L, MD  pyridostigmine (MESTINON) 60 MG tablet Take 60 mg by mouth every 8 (eight) hours.    [provider]  sacubitril-valsartan (ENTRESTO) 97-103 MG Take 1 tablet by mouth 2 (two) times  daily. 03/16/19   Arnoldo Lenis, MD  spironolactone (ALDACTONE) 25 MG tablet Take 0.5 tablets (12.5 mg total) by mouth daily. 09/14/20   Johnson, Clanford L, MD  torsemide (DEMADEX) 20 MG tablet Take 2 tablets (40 mg total) by mouth 2 (two) times daily. 09/05/20   Verta Ellen., NP  traZODone (DESYREL) 100 MG tablet Take 2 tablets (200 mg total) by mouth at bedtime. 10/29/18   Barton Dubois, MD     Allergies    Diclofenac, Tramadol, and Vicodin [hydrocodone-acetaminophen]   Review of Systems   Review of Systems A comprehensive review of systems was completed and negative except as noted in HPI.    Physical Exam BP (!) 162/108  Pulse 86   Temp 98.7 F (37.1 C) (Oral)   Resp (!) 24   Ht '5\' 6"'$  (1.676 m)   Wt 101.2 kg   LMP 08/21/2019   SpO2 96%   BMI 35.99 kg/m   Physical Exam Vitals and nursing note reviewed.  Constitutional:      Appearance: Normal appearance.  HENT:     Head: Normocephalic and atraumatic.     Nose: Nose normal.     Mouth/Throat:     Mouth: Mucous membranes are moist.  Eyes:     Extraocular Movements: Extraocular movements intact.     Conjunctiva/sclera: Conjunctivae normal.  Cardiovascular:     Rate and Rhythm: Normal rate.  Pulmonary:     Effort: Pulmonary effort is normal.     Breath sounds: Rhonchi present. No rales.  Abdominal:     General: Abdomen is flat.     Palpations: Abdomen is soft.     Tenderness: There is no abdominal tenderness.  Musculoskeletal:        General: No swelling. Normal range of motion.     Cervical back: Neck supple.     Right lower leg: No edema.     Left lower leg: No edema.  Skin:    General: Skin is warm and dry.  Neurological:     General: No focal deficit present.     Mental Status: She is alert.  Psychiatric:        Mood and Affect: Mood normal.     ED Results / Procedures / Treatments   Labs (all labs ordered are listed, but only abnormal results are displayed) Labs Reviewed  RESP PANEL  BY RT-PCR (FLU A&B, COVID) ARPGX2 - Abnormal; Notable for the following components:      Result Value   SARS Coronavirus 2 by RT PCR POSITIVE (*)    All other components within normal limits  BASIC METABOLIC PANEL - Abnormal; Notable for the following components:   Potassium 3.1 (*)    Glucose, Bld 145 (*)    Calcium 8.3 (*)    All other components within normal limits  CBC WITH DIFFERENTIAL/PLATELET - Abnormal; Notable for the following components:   Hemoglobin 11.1 (*)    HCT 35.4 (*)    MCV 72.4 (*)    MCH 22.7 (*)    Abs Immature Granulocytes 0.08 (*)    All other components within normal limits  BRAIN NATRIURETIC PEPTIDE - Abnormal; Notable for the following components:   B Natriuretic Peptide 2,318.0 (*)    All other components within normal limits  CBG MONITORING, ED - Abnormal; Notable for the following components:   Glucose-Capillary 167 (*)    All other components within normal limits  TROPONIN I (HIGH SENSITIVITY) - Abnormal; Notable for the following components:   Troponin I (High Sensitivity) 18 (*)    All other components within normal limits  TROPONIN I (HIGH SENSITIVITY) - Abnormal; Notable for the following components:   Troponin I (High Sensitivity) 19 (*)    All other components within normal limits    EKG EKG Interpretation  Date/Time:  Monday February 02 2021 11:06:02 EDT Ventricular Rate:  83 PR Interval:  191 QRS Duration: 104 QT Interval:  429 QTC Calculation: 505 R Axis:   57 Text Interpretation: Sinus rhythm Abnormal R-wave progression, late transition LVH with secondary repolarization abnormality Borderline prolonged QT interval Since last tracing T wave inversion and ST depression in V6 is more pronounced from previous, otherwise no significant change Confirmed  by Calvert Cantor [phone]) on 02/02/2021 11:38:33 AM  Radiology DG Chest Port 1 View  Result Date: 02/02/2021 CLINICAL DATA:  Cough with shortness of breath and flu-like symptoms. EXAM:  PORTABLE CHEST 1 VIEW COMPARISON:  10/18/2020 and CT chest 06/09/2020. FINDINGS: Trachea is midline. Heart is enlarged. There may be minimal streaky atelectasis in the left lower lobe. No airspace consolidation or definite pleural fluid. IMPRESSION: No acute findings. Electronically Signed   By: Lorin Picket M.D.   On: 02/02/2021 13:21    Procedures Procedures  Medications Ordered in the ED Medications  acetaminophen (TYLENOL) tablet 650 mg (has no administration in time range)  hydrALAZINE (APRESOLINE) tablet 100 mg (100 mg Oral Given 02/02/21 1224)     MDM Rules/Calculators/A&P MDM Patient with persistent cough, exam is benign, no LE edema. No wheezing. Will check labs, including trop and BNP given history, CXR and Covid swab.   ED Course  I have reviewed the triage vital signs and the nursing notes.  Pertinent labs & imaging results that were available during my care of the patient were reviewed by me and considered in my medical decision making (see chart for details).  Clinical Course as of 02/02/21 1509  Mon Feb 02, 2021  1223 CBC is unremarkable.  [CS]  [id number] BMP is normal, BNP is elevated but not as high as prior ED visit. Trop is borderline elevated, will check repeat for trending.  [CS]  1329 Covid is positive. Unfortunately she is outside the window for Paxlovid. She does not have any hypoxia, respiratory distress or signs of fluid overload. Recommend she continue with supportive measures at home. Close PCP follow up. RTED for any other concerns. Tessalon for cough.  [CS]  1330 CXR is clear, no signs of acute pulm edema causing her to have a cough.  [CS]  1509 Repeat Trop is unchanged.  [CS]    Clinical Course User Index [CS] Truddie Hidden, MD    Final Clinical Impression(s) / ED Diagnoses Final diagnoses:  [id number]    Rx / DC Orders ED Discharge Orders          Ordered    benzonatate (TESSALON) 100 MG capsule  Every 8 hours        02/02/21 1439              Truddie Hidden, MD 02/02/21 1439

## 2021-02-02 NOTE — ED Triage Notes (Signed)
Pt c/o cough, chest soreness, upper abdominal pain, headache x 10 days. No known exposure to Covid, but reports her kids went back to school and now they have similar symptoms.

## 2021-02-02 NOTE — Progress Notes (Signed)
Cardiology Office Note    Date:  02/04/2021   ID:  Chelsey Santiago, DOB 30-Jun-1975, MRN [id number]   PCP:  Rosita Fire, [place]  Cardiologist:  Carlyle Dolly, MD   Advanced Practice Provider:  No care team member to display Electrophysiologist:  None   [phone]   Chief Complaint  Patient presents with   Follow-up   Hospitalization Follow-up   Leg Swelling   Shortness of Breath     History of Present Illness:  Chelsey Santiago is a 45 y.o. female with past medical history of chronic combined systolic and diastolic CHF/NICM (EF [id number] by echo in 12/2015 with cath showing normal cors, EF at 45% by repeat echo in 10/2018, at 30-35% in 03/2020), EF 35% 08/2020, HTN, IDDM and OSA    Hospitalized in 08/2020 with CHF and Hypertensive urgency with question of compliance.  In ED 02/02/21 with SOB and Covid 19 positive. Out of the window for antiviral. CXR no acute finding. Says she's been coughing for 12 days. Wants clearance for Colonoscopy by Dr. Abbey Chatters. BP running high. Says it always runs high. Says she watches her salt and taking her meds. She has DOE and fatigue. Sometimes takes extra demadex about once a week for edema.    Past Medical History:  Diagnosis Date   Anemia    H&H of 10.6/33 and 07/2008 and 11.9/35 and 09/2010   Anxiety    Chronic combined systolic and diastolic CHF (congestive heart failure) ([place])    a. EF 40-45% by echo in 12/2015 with cath showing normal cors b. EF at 45% by repeat echo in 10/2018 c. EF at 30-35% in 03/2020   Depression with anxiety    Diabetes mellitus without complication ([place])    Diverticulitis    09/2020   Hypertension    Hypertensive heart disease 2009   Pulmonary edema postpartum; mild to moderate mitral regurgitation when hospitalized for CHF in 2009; Echocardiogram in 12/2009-no MR and normal EF; normal CXR in 09/2010   Migraine headache    Miscarriage 03/19/2013   Obesity 04/16/2009    Osteoarthritis, knee 03/29/2011   Preeclampsia    Pulmonary edema    Sleep apnea    Threatened abortion in early pregnancy 03/15/2013    Past Surgical History:  Procedure Laterality Date   BREAST REDUCTION SURGERY  2002   CARDIAC CATHETERIZATION N/A 12/22/2015   Procedure: Left Heart Cath and Coronary Angiography;  Surgeon: Peter M Martinique, MD;  Location: [facility];  Service: Cardiovascular;  Laterality: N/A;   CESAREAN SECTION N/A 04/09/2014   Procedure: CESAREAN SECTION;  Surgeon: Mora Bellman, MD;  Location: [facility];  Service: Obstetrics;  Laterality: N/A;   CHOLECYSTECTOMY     HYSTERECTOMY ABDOMINAL WITH SALPINGECTOMY N/A 09/26/2019   Procedure: HYSTERECTOMY ABDOMINAL WITH SALPINGECTOMY;  Surgeon: Florian Buff, MD;  Location: [facility];  Service: Gynecology;  Laterality: N/A;   LIPOMA EXCISION Right 09/26/2019   Procedure: EXCISION LIPOMA RIGHT VULVAR;  Surgeon: Florian Buff, MD;  Location: [facility];  Service: Gynecology;  Laterality: Right;    Current Medications: Current Meds  Medication Sig   ACCU-CHEK GUIDE test strip USE AS DIRECTED TO TESTCBLOOD SUGAR TWICE DAILY.   albuterol (VENTOLIN HFA) 108 (90 Base) MCG/ACT inhaler Inhale 1 puff into the lungs every 6 (six) hours as needed for wheezing or shortness of breath.   amLODipine (NORVASC) 10 MG tablet Take 10 mg by mouth daily.   ARIPiprazole (ABILIFY) 10  MG tablet Take 1 tablet (10 mg total) by mouth daily.   aspirin EC 81 MG tablet Take 81 mg by mouth daily.   benzonatate (TESSALON) 100 MG capsule Take 1 capsule (100 mg total) by mouth every 8 (eight) hours.   budesonide-formoterol (SYMBICORT) 80-4.5 MCG/ACT inhaler Inhale 2 puffs into the lungs 2 (two) times daily.   carvedilol (COREG) 25 MG tablet Take 1 tablet (25 mg total) by mouth 2 (two) times daily with a meal.   clonazePAM (KLONOPIN) 0.5 MG tablet Take 0.5 mg by mouth 2 (two) times daily.   gabapentin (NEURONTIN) 800 MG tablet Take 800 mg by mouth 3 (three)  times daily.   hydrALAZINE (APRESOLINE) 100 MG tablet Take 100 mg by mouth 3 (three) times daily.   insulin glargine (LANTUS) 100 UNIT/ML injection Inject 0.35 mLs (35 Units total) into the skin at bedtime.   insulin lispro (HUMALOG) 100 UNIT/ML injection Inject 10-16 Units into the skin 3 (three) times daily before meals.   Ipratropium-Albuterol (COMBIVENT RESPIMAT) 20-100 MCG/ACT AERS respimat Inhale 1 puff into the lungs every 6 (six) hours as needed for wheezing or shortness of breath.   isosorbide mononitrate (IMDUR) 30 MG 24 hr tablet Take 1 tablet (30 mg total) by mouth daily.   liraglutide (VICTOZA) 18 MG/3ML SOPN Inject 1.2 mg into the skin daily.   nitroGLYCERIN (NITROSTAT) 0.4 MG SL tablet Place 1 tablet (0.4 mg total) under the tongue every 5 (five) minutes as needed for chest pain.   ondansetron (ZOFRAN ODT) 8 MG disintegrating tablet Take 1 tablet (8 mg total) by mouth every 8 (eight) hours as needed for nausea or vomiting.   ondansetron (ZOFRAN) 8 MG tablet Take 8 mg by mouth 3 (three) times daily.   pyridostigmine (MESTINON) 60 MG tablet Take 60 mg by mouth every 8 (eight) hours.   sacubitril-valsartan (ENTRESTO) 97-103 MG Take 1 tablet by mouth 2 (two) times daily.   spironolactone (ALDACTONE) 25 MG tablet Take 1 tablet (25 mg total) by mouth daily.   torsemide (DEMADEX) 20 MG tablet Take 2 tablets (40 mg total) by mouth 2 (two) times daily.   traZODone (DESYREL) 100 MG tablet Take 2 tablets (200 mg total) by mouth at bedtime.   [DISCONTINUED] spironolactone (ALDACTONE) 25 MG tablet Take 0.5 tablets (12.5 mg total) by mouth daily.     Allergies:   Diclofenac, Tramadol, and Vicodin [hydrocodone-acetaminophen]   Social History   Socioeconomic History   Marital status: Married    Spouse name: Not on file   Number of children: 6   Years of education: Not on file   Highest education level: Not on file  Occupational History   Occupation: unemployed    Employer: UNEMPLOYED   Tobacco Use   Smoking status: Never   Smokeless tobacco: Never  Vaping Use   Vaping Use: Never used  Substance and Sexual Activity   Alcohol use: Yes    Comment: occ   Drug use: No   Sexual activity: Yes    Birth control/protection: Surgical    Comment: hyst  Other Topics Concern   Not on file  Social History Narrative   Lives in [place]   Engaged/boyfriend (father to youngest chid)   4 children: daughter (68 as of 2013), sons (57, 10, 37 as of 2013)   Religion: christian   Social Determinants of Radio broadcast assistant Strain: Not on file  Food Insecurity: Not on file  Transportation Needs: Not on file  Physical Activity: Not  on file  Stress: Not on file  Social Connections: Not on file     Family History:  The patient's  family history includes ADD / ADHD in her son; Diabetes (age of onset: 86) in her mother; Heart attack in her brother; Heart disease in her father and mother; Heart disease (age of onset: 65) in her maternal grandmother; Hyperlipidemia in her paternal grandfather; Hypertension in her father, maternal uncle, and paternal grandfather.   ROS:   Please see the history of present illness.    ROS All other systems reviewed and are negative.   PHYSICAL EXAM:   VS:  BP (!) 160/100   Pulse 80   Ht '5\' 6"'$  (1.676 m)   Wt 228 lb (103.4 kg)   LMP 08/21/2019   SpO2 97%   BMI 36.80 kg/m   Physical Exam  GEN: Obese, in no acute distress  Neck: no JVD, carotid bruits, or masses Cardiac:RRR; no murmurs, rubs, or gallops  Respiratory:  crackles right lung base GI: soft, nontender, nondistended, + BS Ext: trace edema bilaterally MS: no deformity or atrophy  Skin: warm and dry, no rash Neuro:  Alert and Oriented x 3  Psych: euthymic mood, full affect  Wt Readings from Last 3 Encounters:  02/04/21 228 lb (103.4 kg)  02/02/21 223 lb (101.2 kg)  01/08/21 223 lb 3.2 oz (101.2 kg)      Studies/Labs Reviewed:   EKG:  EKG is not ordered today.   Recent  Labs: 08/29/2020: TSH 0.82 10/18/2020: ALT 37 10/20/2020: Magnesium 1.5 02/02/2021: B Natriuretic Peptide 2,318.0; BUN 15; Creatinine, Ser 0.97; Hemoglobin 11.1; Platelets 259; Potassium 3.1; Sodium 136   Lipid Panel    Component Value Date/Time   CHOL 211 (H) 06/03/2018 0615   TRIG 223 (H) 06/03/2018 0615   HDL 39 (L) 06/03/2018 0615   CHOLHDL 5.4 06/03/2018 0615   VLDL 45 (H) 06/03/2018 0615   LDLCALC 127 (H) 06/03/2018 0615    Additional studies/ records that were reviewed today include:  Echo 08/2020 IMPRESSIONS     1. Left ventricular ejection fraction, by estimation, is approximately  35%. The left ventricle has moderately decreased function. The left  ventricle demonstrates global hypokinesis. There is moderate asymmetric  left ventricular hypertrophy of the  posterior segment. Left ventricular diastolic parameters are consistent  with Grade II diastolic dysfunction (pseudonormalization).   2. Right ventricular systolic function is normal. The right ventricular  size is normal. There is mildly elevated pulmonary artery systolic  pressure. The estimated right ventricular systolic pressure is [id number] mmHg.   3. Left atrial size was severely dilated.   4. Right atrial size was moderately dilated.   5. A small pericardial effusion is present. The pericardial effusion is  posterior to the left ventricle and lateral to the left ventricle.   6. The mitral valve is grossly normal. Trivial mitral valve  regurgitation.   7. The aortic valve is tricuspid. Aortic valve regurgitation is not  visualized. Mild to moderate aortic valve sclerosis/calcification is  present, without any evidence of aortic stenosis. Aortic valve mean  gradient measures 9.0 mmHg.   8. The inferior vena cava is dilated in size with <50% respiratory  variability, suggesting right atrial pressure of 15 mmHg.    Risk Assessment/Calculations:         ASSESSMENT:    1. Acute on chronic combined systolic and  diastolic CHF (congestive heart failure) ([place])   2. Preoperative clearance   3. Hypertension, unspecified type  4. Diabetes mellitus with complication ([place])   5. [id number]      PLAN:  In order of problems listed above:   Acute on chronic combined heart failure with LVEF  35%, , moderate diastolic dysfunction, BNP  2318 02/02/21 when in ED with covid 19-increase Demadex to 60 mg twice daily for 3 days then back to 40 mg twice daily increase potassium to 20 mEq twice daily for 3 days then 20 mEq once daily.  2 g sodium diet.F/u in 2-3 weeks to clear for colonoscopy.  Preoperative clearance for endoscopy and colonoscopy by Dr. Abbey Chatters.  Her prep has to be low-sodium, low volume with her history of CHF. According to the Revised Cardiac Risk Index (RCRI), her Perioperative Risk of Major Cardiac Event is (%): 6.6  Her Functional Capacity in METs is: 5.72 according to the [facility] Activity Status Index (DASI).   Hypertension blood pressure elevated today.  She says it always runs spironolactone 25 mg    Type 2 diabetes mellitus, uncontrolled with hemoglobin A1c 9.8%.    COVID-positive 02/02/2021 she says her symptoms have been going on for 12 days.  Chest x-ray without active disease.       Shared Decision Making/Informed Consent        Medication Adjustments/Labs and Tests Ordered: Current medicines are reviewed at length with the patient today.  Concerns regarding medicines are outlined above.  Medication changes, Labs and Tests ordered today are listed in the Patient Instructions below. Patient Instructions  Medication Instructions:   Increase Spironolactone to 25 mg Daily  Increase Torsemide ( Demadex) to 60 mg Two Times Daily for 3 Days  Increase Potassium to 40 meq Daily for 3 Days   *If you need a refill on your cardiac medications before your next appointment, please call your pharmacy*   Lab Work: NONE   If you have labs (blood work) drawn today and your tests are  completely normal, you will receive your results only by: [place] (if you have MyChart) OR A paper copy in the mail If you have any lab test that is abnormal or we need to change your treatment, we will call you to review the results.   Testing/Procedures: NONE    Follow-Up: At [facility], you and your health needs are our priority.  As part of our continuing mission to provide you with exceptional heart care, we have created designated Provider Care Teams.  These Care Teams include your primary Cardiologist (physician) and Advanced Practice Providers (APPs -  Physician Assistants and Nurse Practitioners) who all work together to provide you with the care you need, when you need it.  We recommend signing up for the patient portal called "MyChart".  Sign up information is provided on this After Visit Summary.  MyChart is used to connect with patients for Virtual Visits (Telemedicine).  Patients are able to view lab/test results, encounter notes, upcoming appointments, etc.  Non-urgent messages can be sent to your provider as well.   To learn more about what you can do with MyChart, go to [email or web address].    Your next appointment:   2 -3 week(s)  The format for your next appointment:   In Person  Provider:   Bernerd Pho, PA-C or Ermalinda Barrios, PA-C   Other Instructions Thank you for choosing [facility]!     Signed, Ermalinda Barrios, PA-C  02/04/2021 2:54 PM    [facility] [place], [place], [place]  [address] Phone: [phone])  [phone]; Fax: [phone]    

## 2021-02-04 ENCOUNTER — Telehealth: Payer: Self-pay | Admitting: Gastroenterology

## 2021-02-04 ENCOUNTER — Ambulatory Visit: Payer: Medicaid Other | Admitting: Physician Assistant

## 2021-02-04 ENCOUNTER — Encounter: Payer: Self-pay | Admitting: Physician Assistant

## 2021-02-04 VITALS — BP 160/100 | HR 80 | Ht 66.0 in | Wt 228.0 lb

## 2021-02-04 DIAGNOSIS — E118 Type 2 diabetes mellitus with unspecified complications: Secondary | ICD-10-CM | POA: Diagnosis not present

## 2021-02-04 DIAGNOSIS — I5043 Acute on chronic combined systolic (congestive) and diastolic (congestive) heart failure: Secondary | ICD-10-CM

## 2021-02-04 DIAGNOSIS — I10 Essential (primary) hypertension: Secondary | ICD-10-CM

## 2021-02-04 DIAGNOSIS — Z01810 Encounter for preprocedural cardiovascular examination: Secondary | ICD-10-CM

## 2021-02-04 DIAGNOSIS — Z01818 Encounter for other preprocedural examination: Secondary | ICD-10-CM

## 2021-02-04 DIAGNOSIS — U071 COVID-19: Secondary | ICD-10-CM

## 2021-02-04 MED ORDER — SPIRONOLACTONE 25 MG PO TABS: 25.0000 mg | ORAL_TABLET | Freq: Every day | ORAL | 3 refills | Status: DC

## 2021-02-04 NOTE — Telephone Encounter (Signed)
Attention: Preop   We would like obtain cardiac clearance for this patient please.  Procedure: COLONOSCOPY, EGD WITH POSSIBLE DILATION  Date: TBD  Medication to hold:   Surgeon: DR. Abbey Chatters  Phone: [phone]  Fax:  [phone]  Type of Anesthesia: PROPOFOL

## 2021-02-04 NOTE — Telephone Encounter (Signed)
   Patient Name: Chelsey Santiago  DOB: Dec 24, 1975 MRN: [id number]  Primary Cardiologist: Carlyle Dolly, MD  Chart reviewed as part of pre-operative protocol coverage. Patient was seen today in clinic by Ermalinda Barrios who is adjusting medication for acute on chronic CHF. Per Chelsey Santiago, needs to "F/u in 2-3 weeks to clear for colonoscopy." Patient has upcoming appointment 02/20/21 with Chelsey Santiago. Need for clearance added to appt notes. Per office protocol, the provider should assess clearance at time of office visit and should forward their finalized clearance decision to requesting party below. Will route to surgeon as Juluis Rainier. I will remove this message from the pre-op box.   Charlie Pitter, PA-C 02/04/2021, 5:41 PM

## 2021-02-04 NOTE — Patient Instructions (Signed)
Medication Instructions:   Increase Spironolactone to 25 mg Daily  Increase Torsemide ( Demadex) to 60 mg Two Times Daily for 3 Days  Increase Potassium to 40 meq Daily for 3 Days   *If you need a refill on your cardiac medications before your next appointment, please call your pharmacy*   Lab Work: NONE   If you have labs (blood work) drawn today and your tests are completely normal, you will receive your results only by: [place] (if you have MyChart) OR A paper copy in the mail If you have any lab test that is abnormal or we need to change your treatment, we will call you to review the results.   Testing/Procedures: NONE    Follow-Up: At [facility], you and your health needs are our priority.  As part of our continuing mission to provide you with exceptional heart care, we have created designated Provider Care Teams.  These Care Teams include your primary Cardiologist (physician) and Advanced Practice Providers (APPs -  Physician Assistants and Nurse Practitioners) who all work together to provide you with the care you need, when you need it.  We recommend signing up for the patient portal called "MyChart".  Sign up information is provided on this After Visit Summary.  MyChart is used to connect with patients for Virtual Visits (Telemedicine).  Patients are able to view lab/test results, encounter notes, upcoming appointments, etc.  Non-urgent messages can be sent to your provider as well.   To learn more about what you can do with MyChart, go to [email or web address].    Your next appointment:   2 -3 week(s)  The format for your next appointment:   In Person  Provider:   Bernerd Pho, PA-C or Ermalinda Barrios, PA-C   Other Instructions Thank you for choosing [facility]!

## 2021-02-19 NOTE — Progress Notes (Addendum)
Cardiology Office Note  Date: 02/20/2021   ID: Chelsey Santiago, DOB 1975/06/07, MRN [id number]  PCP:  Chelsey Fire, MD  Cardiologist:  Chelsey Dolly, MD Electrophysiologist:  None   Chief Complaint: 2 to 3-week follow-up and cardiac clearance for colonoscopy  History of Present Illness: Chelsey Santiago is a 45 y.o. female with a history of chronic combined systolic diastolic heart failure, DM2, hypertension, anemia, sleep apnea, NSTEMI, OSA on CPAP.   She was last seen by Chelsey Husk PA on 02/04/2021.  She had recently been diagnosed with COVID-19 on 02/02/2021 when she presented to ED with shortness of breath.  No acute findings on chest x-ray.  She was wanting clearance for colonoscopy with Dr. Abbey Santiago.  Blood pressure was running.  She was having DOE and fatigue.  States she sometimes took extra Demadex about once a week for edema.  [name] increased her Demadex to 60 mg twice a day for 3 days then to revert back to 40 mg twice daily and to increase potassium 20 mill equivalents twice daily for 3 days then decrease to 20 mEq once daily.  She was advised 2 g sodium diet and follow-up in 2 to 3 weeks to clear for colonoscopy.  At that time her functional capacity a in METS was 5.72 according to [facility] activity status index.  She was continuing spironolactone 25 mg daily.  Her diabetes was uncontrolled with hemoglobin A1c of 9.8%.  She was COVID-positive on 02/02/2021.  Her symptoms have been ongoing for 12 days.  Chest x-ray was negative for any acute process.   She is here today for clearance for colonoscopy with Dr. Abbey Santiago.  She states she recently had COVID infection.  She continues with a lingering cough.  States she has issues with diverticulitis and needs a follow-up colonoscopy.  States she is against some weight recently.  Today's weight is 233 pounds.  Her weight on 02/04/2021 was 228 pounds.  Previously weight in August on the 25th was 223 pounds.  She currently takes torsemide 40  mg p.o. twice daily.  She takes an extra occasional torsemide for weight gain or shortness of breath.  He she has some mild lower extremity edema nonpitting.  Currently denies any DOE or PND.  Blood pressure is elevated at 146/100.  She states she took her antihypertensive medications this morning around 830.  Current cardiac regimen includes; amlodipine 10 mg daily, aspirin 81 mg daily, carvedilol 25 mg p.o. twice daily, Entresto 97/103 p.o. twice daily, hydralazine 100 mg p.o. 3 times daily, Imdur 30 mg p.o. daily, sublingual nitroglycerin as needed, potassium 20 mEq daily, spironolactone 25 mg p.o. daily, torsemide 40 mg p.o. twice daily.  Past Medical History:  Diagnosis Date   Anemia    H&H of 10.6/33 and 07/2008 and 11.9/35 and 09/2010   Anxiety    Chronic combined systolic and diastolic CHF (congestive heart failure) ([place])    a. EF 40-45% by echo in 12/2015 with cath showing normal cors b. EF at 45% by repeat echo in 10/2018 c. EF at 30-35% in 03/2020   Depression with anxiety    Diabetes mellitus without complication ([place])    Diverticulitis    09/2020   Hypertension    Hypertensive heart disease 2009   Pulmonary edema postpartum; mild to moderate mitral regurgitation when hospitalized for CHF in 2009; Echocardiogram in 12/2009-no MR and normal EF; normal CXR in 09/2010   Migraine headache    Miscarriage 03/19/2013   Obesity 04/16/2009  Osteoarthritis, knee 03/29/2011   Preeclampsia    Pulmonary edema    Sleep apnea    Threatened abortion in early pregnancy 03/15/2013    Past Surgical History:  Procedure Laterality Date   BREAST REDUCTION SURGERY  2002   CARDIAC CATHETERIZATION N/A 12/22/2015   Procedure: Left Heart Cath and Coronary Angiography;  Surgeon: Peter M Martinique, MD;  Location: [facility];  Service: Cardiovascular;  Laterality: N/A;   CESAREAN SECTION N/A 04/09/2014   Procedure: CESAREAN SECTION;  Surgeon: Mora Bellman, MD;  Location: [facility];  Service: Obstetrics;   Laterality: N/A;   CHOLECYSTECTOMY     HYSTERECTOMY ABDOMINAL WITH SALPINGECTOMY N/A 09/26/2019   Procedure: HYSTERECTOMY ABDOMINAL WITH SALPINGECTOMY;  Surgeon: Florian Buff, MD;  Location: [facility];  Service: Gynecology;  Laterality: N/A;   LIPOMA EXCISION Right 09/26/2019   Procedure: EXCISION LIPOMA RIGHT VULVAR;  Surgeon: Florian Buff, MD;  Location: [facility];  Service: Gynecology;  Laterality: Right;    Current Outpatient Medications  Medication Sig Dispense Refill   ACCU-CHEK GUIDE test strip USE AS DIRECTED TO TESTCBLOOD SUGAR TWICE DAILY.     albuterol (VENTOLIN HFA) 108 (90 Base) MCG/ACT inhaler Inhale 1 puff into the lungs every 6 (six) hours as needed for wheezing or shortness of breath. 18 g 0   amLODipine (NORVASC) 10 MG tablet Take 10 mg by mouth daily.     ARIPiprazole (ABILIFY) 10 MG tablet Take 1 tablet (10 mg total) by mouth daily.     aspirin EC 81 MG tablet Take 81 mg by mouth daily.     benzonatate (TESSALON) 100 MG capsule Take 1 capsule (100 mg total) by mouth every 8 (eight) hours. 21 capsule 0   budesonide-formoterol (SYMBICORT) 80-4.5 MCG/ACT inhaler Inhale 2 puffs into the lungs 2 (two) times daily. 10.2 g 0   carvedilol (COREG) 25 MG tablet Take 1 tablet (25 mg total) by mouth 2 (two) times daily with a meal. 360 tablet 3   clonazePAM (KLONOPIN) 0.5 MG tablet Take 0.5 mg by mouth 2 (two) times daily.     dapagliflozin propanediol (FARXIGA) 10 MG TABS tablet Take 1 tablet (10 mg total) by mouth daily before breakfast. 30 tablet 6   gabapentin (NEURONTIN) 800 MG tablet Take 800 mg by mouth 3 (three) times daily.     hydrALAZINE (APRESOLINE) 100 MG tablet Take 100 mg by mouth 3 (three) times daily.     insulin glargine (LANTUS) 100 UNIT/ML injection Inject 0.35 mLs (35 Units total) into the skin at bedtime. 10 mL 11   insulin lispro (HUMALOG) 100 UNIT/ML injection Inject 10-16 Units into the skin 3 (three) times daily before meals.     Ipratropium-Albuterol (COMBIVENT  RESPIMAT) 20-100 MCG/ACT AERS respimat Inhale 1 puff into the lungs every 6 (six) hours as needed for wheezing or shortness of breath. 1 Inhaler 0   isosorbide mononitrate (IMDUR) 30 MG 24 hr tablet Take 1 tablet (30 mg total) by mouth daily. 30 tablet 1   liraglutide (VICTOZA) 18 MG/3ML SOPN Inject 1.2 mg into the skin daily. 9 mL 3   nitroGLYCERIN (NITROSTAT) 0.4 MG SL tablet Place 1 tablet (0.4 mg total) under the tongue every 5 (five) minutes as needed for chest pain. 30 tablet 12   ondansetron (ZOFRAN ODT) 8 MG disintegrating tablet Take 1 tablet (8 mg total) by mouth every 8 (eight) hours as needed for nausea or vomiting. 20 tablet 0   potassium chloride SA (KLOR-CON) 20 MEQ tablet Take  20 mEq by mouth daily.     pyridostigmine (MESTINON) 60 MG tablet Take 60 mg by mouth every 8 (eight) hours.     sacubitril-valsartan (ENTRESTO) 97-103 MG Take 1 tablet by mouth 2 (two) times daily. 60 tablet 6   spironolactone (ALDACTONE) 25 MG tablet Take 1 tablet (25 mg total) by mouth daily. 90 tablet 3   torsemide (DEMADEX) 20 MG tablet Take 2 tablets (40 mg total) by mouth 2 (two) times daily. 360 tablet 2   traZODone (DESYREL) 100 MG tablet Take 2 tablets (200 mg total) by mouth at bedtime.     No current facility-administered medications for this visit.   Allergies:  Diclofenac, Tramadol, and Vicodin [hydrocodone-acetaminophen]   Social History: The patient  reports that she has never smoked. She has never used smokeless tobacco. She reports current alcohol use. She reports that she does not use drugs.   Family History: The patient's family history includes ADD / ADHD in her son; Diabetes (age of onset: 93) in her mother; Heart attack in her brother; Heart disease in her father and mother; Heart disease (age of onset: 42) in her maternal grandmother; Hyperlipidemia in her paternal grandfather; Hypertension in her father, maternal uncle, and paternal grandfather.   ROS:  Please see the history of  present illness. Otherwise, complete review of systems is positive for none.  All other systems are reviewed and negative.   Physical Exam: VS:  BP (!) 146/100   Pulse 80   Ht 5\' 6"  (1.676 m)   Wt 233 lb 9.6 oz (106 kg)   LMP 08/21/2019   SpO2 98%   BMI 37.70 kg/m , BMI Body mass index is 37.7 kg/m.  Wt Readings from Last 3 Encounters:  02/20/21 233 lb 9.6 oz (106 kg)  02/04/21 228 lb (103.4 kg)  02/02/21 223 lb (101.2 kg)    General: Obese patient appears comfortable at rest. Neck: Supple, no elevated JVP or carotid bruits, no thyromegaly. Lungs: Clear to auscultation, nonlabored breathing at rest. Cardiac: Regular rate and rhythm, no S3 or significant systolic murmur, no pericardial rub. Extremities: No pitting edema, distal pulses 2+. Skin: Warm and dry. Musculoskeletal: No kyphosis. Neuropsychiatric: Alert and oriented x3, affect grossly appropriate.  ECG:  EKG on 02/02/2021 sinus rhythm rate of 83, abnormal R wave progression, late transition, LVH with secondary repolarization abnormality rate of 83, borderline prolonged QT  Recent Labwork: 08/29/2020: TSH 0.82 10/18/2020: ALT 37; AST 28 10/20/2020: Magnesium 1.5 02/02/2021: B Natriuretic Peptide 2,318.0; BUN 15; Creatinine, Ser 0.97; Hemoglobin 11.1; Platelets 259; Potassium 3.1; Sodium 136     Component Value Date/Time   CHOL 211 (H) 06/03/2018 0615   TRIG 223 (H) 06/03/2018 0615   HDL 39 (L) 06/03/2018 0615   CHOLHDL 5.4 06/03/2018 0615   VLDL 45 (H) 06/03/2018 0615   LDLCALC 127 (H) 06/03/2018 0615    Other Studies Reviewed Today:  Echo 08/2020 IMPRESSIONS   1. Left ventricular ejection fraction, by estimation, is approximately  35%. The left ventricle has moderately decreased function. The left  ventricle demonstrates global hypokinesis. There is moderate asymmetric  left ventricular hypertrophy of the  posterior segment. Left ventricular diastolic parameters are consistent  with Grade II diastolic dysfunction  (pseudonormalization).   2. Right ventricular systolic function is normal. The right ventricular  size is normal. There is mildly elevated pulmonary artery systolic  pressure. The estimated right ventricular systolic pressure is 29.9 mmHg.   3. Left atrial size was severely dilated.  4. Right atrial size was moderately dilated.   5. A small pericardial effusion is present. The pericardial effusion is  posterior to the left ventricle and lateral to the left ventricle.   6. The mitral valve is grossly normal. Trivial mitral valve  regurgitation.   7. The aortic valve is tricuspid. Aortic valve regurgitation is not  visualized. Mild to moderate aortic valve sclerosis/calcification is  present, without any evidence of aortic stenosis. Aortic valve mean  gradient measures 9.0 mmHg.   8. The inferior vena cava is dilated in size with <50% respiratory  variability, suggesting right atrial pressure of 15 mmHg.   Assessment and Plan:  1. Preoperative clearance   2. Acute on chronic combined systolic and diastolic CHF (congestive heart failure) ([place])   3. Hypertension, unspecified type   4. Medication management    1. Preoperative clearance Patient here for preoperative clearance for colonoscopy for history of diverticulitis/diverticulosis.  She has an upcoming colonoscopy.  Her revised cardiac risk index is 2 placing her at a 6.6% risk of major perioperative cardiac event.  Her [facility] activity status index is 24.2 giving her a functional capacity of 5.2.  From cardiac standpoint she is cleared to undergo colonoscopy with moderate conscious sedation.  2. Acute on chronic combined systolic and diastolic CHF (congestive heart failure) ([facility]) Echocardiogram Chelsey 2022 EF 35%, global hypokinesis, and moderate asymmetric LVH of posterior segment.  G2 DD, mildly elevated PASP 43.5 mmHg, LA severely dilated, RA moderately dilated, small pericardial effusion posterior to the left ventricle and lateral.   Trivial MR.  She has gained some weight recently.  Currently taking torsemide 40 mg p.o. twice daily.  She occasionally takes extra for weight gain and shortness of breath.  Weight today is 233 pounds.  Previous weight was 228.  She states she plans to take an extra pill for the next couple days to get her weight down.  Continue carvedilol 25 mg p.o. twice daily.  Entresto 97/103 p.o. twice daily, hydralazine 100 mg p.o. 3 times daily, Imdur 30 mg p.o. daily, spironolactone 25 mg daily, torsemide 40 mg p.o. twice daily.  Please get a follow-up echocardiogram to reassess LV function given recent COVID infection.  Patient has gained some weight.  Please start Farxiga 10 mg daily as part of GDMT for patient with diabetes and heart failure.  3. Hypertension, unspecified type Blood pressure today is 146/100.  She is on multiple antihypertensive medications including amlodipine 10 mg daily, carvedilol 25 mg p.o. twice daily, Entresto 97/103 p.o. twice daily.  Hydralazine 100 mg p.o. 3 times daily.  Spironolactone 25 mg p.o. daily.  4. Medication management Starting Farxiga 10 mg p.o. daily as addition to GDMT therapy for patients with heart failure and diabetes.  Medication Adjustments/Labs and Tests Ordered: Current medicines are reviewed at length with the patient today.  Concerns regarding medicines are outlined above.   Disposition: Follow-up with Dr. Harl Bowie or APP 6 months  Signed, Levell July, NP 02/20/2021 11:54 AM    [facility] at [address], [place], [place] [address] Phone: [phone]; Fax: [phone]

## 2021-02-20 ENCOUNTER — Ambulatory Visit (INDEPENDENT_AMBULATORY_CARE_PROVIDER_SITE_OTHER): Payer: Medicaid Other | Admitting: Family Medicine

## 2021-02-20 ENCOUNTER — Encounter: Payer: Self-pay | Admitting: Family Medicine

## 2021-02-20 VITALS — BP 146/100 | HR 80 | Ht 66.0 in | Wt 233.6 lb

## 2021-02-20 DIAGNOSIS — I10 Essential (primary) hypertension: Secondary | ICD-10-CM

## 2021-02-20 DIAGNOSIS — I5043 Acute on chronic combined systolic (congestive) and diastolic (congestive) heart failure: Secondary | ICD-10-CM

## 2021-02-20 DIAGNOSIS — Z79899 Other long term (current) drug therapy: Secondary | ICD-10-CM

## 2021-02-20 DIAGNOSIS — Z01818 Encounter for other preprocedural examination: Secondary | ICD-10-CM | POA: Diagnosis not present

## 2021-02-20 MED ORDER — DAPAGLIFLOZIN PROPANEDIOL 10 MG PO TABS: 10.0000 mg | ORAL_TABLET | Freq: Every day | ORAL | 6 refills | Status: DC

## 2021-02-20 NOTE — Patient Instructions (Addendum)
Medication Instructions:  Your physician has recommended you make the following change in your medication:  Start farxiga 10 mg daily Continue other medications the same  Labwork: BMET & MG in 2 weeks (03/06/21) at [facility] in [name].  Testing/Procedures: Your physician has requested that you have an echocardiogram. Echocardiography is a painless test that uses sound waves to create images of your heart. It provides your doctor with information about the size and shape of your heart and how well your heart's chambers and valves are working. This procedure takes approximately one hour. There are no restrictions for this procedure.  Follow-Up: Your physician recommends that you schedule a follow-up appointment in: 6 months   Any Other Special Instructions Will Be Listed Below (If Applicable).  If you need a refill on your cardiac medications before your next appointment, please call your pharmacy.

## 2021-02-23 ENCOUNTER — Encounter: Payer: Self-pay | Admitting: *Deleted

## 2021-02-23 NOTE — Telephone Encounter (Signed)
See cardiology note dated 02/20/21. Pt has been cleared and is ready to be scheduled.

## 2021-02-23 NOTE — Telephone Encounter (Signed)
Reviewed office visit with cardiology 02/23/2021.  Patient was cleared for procedures.  Please arrange EGD and colonoscopy with propofol with Dr. Abbey Chatters. Dx: IDA, hx of diverticulitis, dysphagia.  ASA III BMP at preop  Medication adjustments: Hold iron x7 days prior to procedure.  1 day prior to procedure:  1/2 dose of Farxiga (5 mg daily)  1/2 dose of Victoza (0.6 mg daily)  1/2 dose of Lantus (17.5 units at bedtime)  Continue mealtime insulin as needed. Day of procedure:  No morning diabetes medications. It is important that she takes her blood pressure medications this morning.

## 2021-02-23 NOTE — Telephone Encounter (Signed)
Fowarding to Cyril Mourning to review cardiology note prior to scheduling

## 2021-02-23 NOTE — Progress Notes (Signed)
02/20/2021 RE: Medication Service Request Approval Member ID: [id number] O DOB: [birth year] Case ID: [id number] Re: Initial Service Request Determination-Approval [facility] has reviewed the request for Farxiga Tab 10mg  submitted by Chelsey Santiago on behalf of Chelsey Santiago on 02/20/2021. After review, the request for service is: Approved through 02/20/2022

## 2021-02-24 MED ORDER — PEG 3350-KCL-NA BICARB-NACL 420 G PO SOLR: ORAL | 0 refills | Status: DC

## 2021-02-24 NOTE — Telephone Encounter (Addendum)
Patient returned call. She has been scheduled for 11/7 at 7:30am. Aware will mail prep instructions with pre-op appt. Confirmed address/pharmacy.   PA submitted via [place]. PA in review. Tracking #: [id number]

## 2021-02-24 NOTE — Telephone Encounter (Signed)
LMOVM for pt 

## 2021-02-24 NOTE — Addendum Note (Signed)
Addended by: Cheron Every on: 02/24/2021 10:17 AM   Modules accepted: Orders

## 2021-02-25 ENCOUNTER — Encounter: Payer: Self-pay | Admitting: *Deleted

## 2021-02-25 NOTE — Telephone Encounter (Signed)
PA approved. Auth# [id number] DOS 03/23/2021-06/21/2021

## 2021-03-18 NOTE — Patient Instructions (Signed)
Chelsey Santiago  03/18/2021     @PREFPERIOPPHARMACY @   Your procedure is scheduled on  03/23/2021.   Report to [facility] at  [place] AM.   Call this number if you have problems the morning of surgery:  [phone]   Remember:  Follow the diet and prep instructions given to you by the office.    Take 17.5 units of your night time insulin the night before your procedure.    DO NOT take any medications for diabetes the morning of your procedure.    Use your inhalers before you come and bring you rescue inhaler with you.    Take these medicines the morning of surgery with A SIP OF WATER        amlodipine, abilify, carvedilol, clonazepam, gabapentin, isosorbide, zofran (if needed), mestinon, entresto.     Do not wear jewelry, make-up or nail polish.  Do not wear lotions, powders, or perfumes, or deodorant.  Do not shave 48 hours prior to surgery.  Men may shave face and neck.  Do not bring valuables to the hospital.  [facility] is not responsible for any belongings or valuables.  Contacts, dentures or bridgework may not be worn into surgery.  Leave your suitcase in the car.  After surgery it may be brought to your room.  For patients admitted to the hospital, discharge time will be determined by your treatment team.  Patients discharged the day of surgery will not be allowed to drive home and must have someone with them for 24 hours.    Special instructions:   DO NOT smoke tobacco or vape for 24 hours before your procedure.  Please read over the following fact sheets that you were given. Anesthesia Post-op Instructions and Care and Recovery After Surgery      Upper Endoscopy, Adult, Care After This sheet gives you information about how to care for yourself after your procedure. Your health care provider may also give you more specific instructions. If you have problems or questions, contact your health care provider. What can I expect after the  procedure? After the procedure, it is common to have: A sore throat. Mild stomach pain or discomfort. Bloating. Nausea. Follow these instructions at home:  Follow instructions from your health care provider about what to eat or drink after your procedure. Return to your normal activities as told by your health care provider. Ask your health care provider what activities are safe for you. Take over-the-counter and prescription medicines only as told by your health care provider. If you were given a sedative during the procedure, it can affect you for several hours. Do not drive or operate machinery until your health care provider says that it is safe. Keep all follow-up visits as told by your health care provider. This is important. Contact a health care provider if you have: A sore throat that lasts longer than one day. Trouble swallowing. Get help right away if: You vomit blood or your vomit looks like coffee grounds. You have: A fever. Bloody, black, or tarry stools. A severe sore throat or you cannot swallow. Difficulty breathing. Severe pain in your chest or abdomen. Summary After the procedure, it is common to have a sore throat, mild stomach discomfort, bloating, and nausea. If you were given a sedative during the procedure, it can affect you for several hours. Do not drive or operate machinery until your health care provider says that it is safe. Follow  instructions from your health care provider about what to eat or drink after your procedure. Return to your normal activities as told by your health care provider. This information is not intended to replace advice given to you by your health care provider. Make sure you discuss any questions you have with your health care provider. Document Revised: 05/01/2019 Document Reviewed: 10/03/2017 Elsevier Patient Education  2022 [place]. Esophageal Dilatation Esophageal dilatation, also called esophageal dilation, is a  procedure to widen or open a blocked or narrowed part of the esophagus. The esophagus is the part of the body that moves food and liquid from the mouth to the stomach. You may need this procedure if: You have a buildup of scar tissue in your esophagus that makes it difficult, painful, or impossible to swallow. This can be caused by gastroesophageal reflux disease (GERD). You have cancer of the esophagus. There is a problem with how food moves through your esophagus. In some cases, you may need this procedure repeated at a later time to dilate the esophagus gradually. Tell a health care provider about: Any allergies you have. All medicines you are taking, including vitamins, herbs, eye drops, creams, and over-the-counter medicines. Any problems you or family members have had with anesthetic medicines. Any blood disorders you have. Any surgeries you have had. Any medical conditions you have. Any antibiotic medicines you are required to take before dental procedures. Whether you are pregnant or may be pregnant. What are the risks? Generally, this is a safe procedure. However, problems may occur, including: Bleeding due to a tear in the lining of the esophagus. A hole, or perforation, in the esophagus. What happens before the procedure? Ask your health care provider about: Changing or stopping your regular medicines. This is especially important if you are taking diabetes medicines or blood thinners. Taking medicines such as aspirin and ibuprofen. These medicines can thin your blood. Do not take these medicines unless your health care provider tells you to take them. Taking over-the-counter medicines, vitamins, herbs, and supplements. Follow instructions from your health care provider about eating or drinking restrictions. Plan to have a responsible adult take you home from the hospital or clinic. Plan to have a responsible adult care for you for the time you are told after you leave the  hospital or clinic. This is important. What happens during the procedure? You may be given a medicine to help you relax (sedative). A numbing medicine may be sprayed into the back of your throat, or you may gargle the medicine. Your health care provider may perform the dilatation using various surgical instruments, such as: Simple dilators. This instrument is carefully placed in the esophagus to stretch it. Guided wire bougies. This involves using an endoscope to insert a wire into the esophagus. A dilator is passed over this wire to enlarge the esophagus. Then the wire is removed. Balloon dilators. An endoscope with a small balloon is inserted into the esophagus. The balloon is inflated to stretch the esophagus and open it up. The procedure may vary among health care providers and hospitals. What can I expect after the procedure? Your blood pressure, heart rate, breathing rate, and blood oxygen level will be monitored until you leave the hospital or clinic. Your throat may feel slightly sore and numb. This will get better over time. You will not be allowed to eat or drink until your throat is no longer numb. When you are able to drink, urinate, and sit on the edge of the bed  without nausea or dizziness, you may be able to return home. Follow these instructions at home: Take over-the-counter and prescription medicines only as told by your health care provider. If you were given a sedative during the procedure, it can affect you for several hours. Do not drive or operate machinery until your health care provider says that it is safe. Plan to have a responsible adult care for you for the time you are told. This is important. Follow instructions from your health care provider about any eating or drinking restrictions. Do not use any products that contain nicotine or tobacco, such as cigarettes, e-cigarettes, and chewing tobacco. If you need help quitting, ask your health care provider. Keep all  follow-up visits. This is important. Contact a health care provider if: You have a fever. You have pain that is not relieved by medicine. Get help right away if: You have chest pain. You have trouble breathing. You have trouble swallowing. You vomit blood. You have black, tarry, or bloody stools. These symptoms may represent a serious problem that is an emergency. Do not wait to see if the symptoms will go away. Get medical help right away. Call your local emergency services (911 in the [place] Do not drive yourself to the hospital. Summary Esophageal dilatation, also called esophageal dilation, is a procedure to widen or open a blocked or narrowed part of the esophagus. Plan to have a responsible adult take you home from the hospital or clinic. For this procedure, a numbing medicine may be sprayed into the back of your throat, or you may gargle the medicine. Do not drive or operate machinery until your health care provider says that it is safe. This information is not intended to replace advice given to you by your health care provider. Make sure you discuss any questions you have with your health care provider. Document Revised: 09/19/2019 Document Reviewed: 09/19/2019 Elsevier Patient Education  [place]. Colonoscopy, Adult, Care After This sheet gives you information about how to care for yourself after your procedure. Your health care provider may also give you more specific instructions. If you have problems or questions, contact your health care provider. What can I expect after the procedure? After the procedure, it is common to have: A small amount of blood in your stool for 24 hours after the procedure. Some gas. Mild cramping or bloating of your abdomen. Follow these instructions at home: Eating and drinking  Drink enough fluid to keep your urine pale yellow. Follow instructions from your health care provider about eating or drinking restrictions. Resume your normal  diet as instructed by your health care provider. Avoid heavy or fried foods that are hard to digest. Activity Rest as told by your health care provider. Avoid sitting for a long time without moving. Get up to take short walks every 1-2 hours. This is important to improve blood flow and breathing. Ask for help if you feel weak or unsteady. Return to your normal activities as told by your health care provider. Ask your health care provider what activities are safe for you. Managing cramping and bloating  Try walking around when you have cramps or feel bloated. Apply heat to your abdomen as told by your health care provider. Use the heat source that your health care provider recommends, such as a moist heat pack or a heating pad. Place a towel between your skin and the heat source. Leave the heat on for 20-30 minutes. Remove the heat if your skin turns bright  red. This is especially important if you are unable to feel pain, heat, or cold. You may have a greater risk of getting burned. General instructions If you were given a sedative during the procedure, it can affect you for several hours. Do not drive or operate machinery until your health care provider says that it is safe. For the first 24 hours after the procedure: Do not sign important documents. Do not drink alcohol. Do your regular daily activities at a slower pace than normal. Eat soft foods that are easy to digest. Take over-the-counter and prescription medicines only as told by your health care provider. Keep all follow-up visits as told by your health care provider. This is important. Contact a health care provider if: You have blood in your stool 2-3 days after the procedure. Get help right away if you have: More than a small spotting of blood in your stool. Large blood clots in your stool. Swelling of your abdomen. Nausea or vomiting. A fever. Increasing pain in your abdomen that is not relieved with medicine. Summary After  the procedure, it is common to have a small amount of blood in your stool. You may also have mild cramping and bloating of your abdomen. If you were given a sedative during the procedure, it can affect you for several hours. Do not drive or operate machinery until your health care provider says that it is safe. Get help right away if you have a lot of blood in your stool, nausea or vomiting, a fever, or increased pain in your abdomen. This information is not intended to replace advice given to you by your health care provider. Make sure you discuss any questions you have with your health care provider. Document Revised: 04/27/2019 Document Reviewed: 11/27/2018 Elsevier Patient Education  [place] After This sheet gives you information about how to care for yourself after your procedure. Your health care provider may also give you more specific instructions. If you have problems or questions, contact your health care provider. What can I expect after the procedure? After the procedure, it is common to have: Tiredness. Forgetfulness about what happened after the procedure. Impaired judgment for important decisions. Nausea or vomiting. Some difficulty with balance. Follow these instructions at home: For the time period you were told by your health care provider:   Rest as needed. Do not participate in activities where you could fall or become injured. Do not drive or use machinery. Do not drink alcohol. Do not take sleeping pills or medicines that cause drowsiness. Do not make important decisions or sign legal documents. Do not take care of children on your own. Eating and drinking Follow the diet that is recommended by your health care provider. Drink enough fluid to keep your urine pale yellow. If you vomit: Drink water, juice, or soup when you can drink without vomiting. Make sure you have little or no nausea before eating solid foods. General  instructions Have a responsible adult stay with you for the time you are told. It is important to have someone help care for you until you are awake and alert. Take over-the-counter and prescription medicines only as told by your health care provider. If you have sleep apnea, surgery and certain medicines can increase your risk for breathing problems. Follow instructions from your health care provider about wearing your sleep device: Anytime you are sleeping, including during daytime naps. While taking prescription pain medicines, sleeping medicines, or medicines that make you drowsy.  Avoid smoking. Keep all follow-up visits as told by your health care provider. This is important. Contact a health care provider if: You keep feeling nauseous or you keep vomiting. You feel light-headed. You are still sleepy or having trouble with balance after 24 hours. You develop a rash. You have a fever. You have redness or swelling around the IV site. Get help right away if: You have trouble breathing. You have new-onset confusion at home. Summary For several hours after your procedure, you may feel tired. You may also be forgetful and have poor judgment. Have a responsible adult stay with you for the time you are told. It is important to have someone help care for you until you are awake and alert. Rest as told. Do not drive or operate machinery. Do not drink alcohol or take sleeping pills. Get help right away if you have trouble breathing, or if you suddenly become confused. This information is not intended to replace advice given to you by your health care provider. Make sure you discuss any questions you have with your health care provider. Document Revised: 01/17/2020 Document Reviewed: 04/05/2019 Elsevier Patient Education  2022 [employer].

## 2021-03-19 ENCOUNTER — Encounter (HOSPITAL_COMMUNITY)
Admission: RE | Admit: 2021-03-19 | Discharge: 2021-03-19 | Disposition: A | Discharge status: Home / Self Care | Payer: Medicaid Other | Source: Ambulatory Visit | Attending: Internal Medicine | Admitting: Internal Medicine

## 2021-03-19 VITALS — BP 144/88 | HR 88 | Temp 97.5°F | Resp 18 | Ht 66.0 in | Wt 225.0 lb

## 2021-03-19 DIAGNOSIS — D509 Iron deficiency anemia, unspecified: Secondary | ICD-10-CM | POA: Diagnosis not present

## 2021-03-19 DIAGNOSIS — Z01812 Encounter for preprocedural laboratory examination: Secondary | ICD-10-CM | POA: Insufficient documentation

## 2021-03-19 DIAGNOSIS — D649 Anemia, unspecified: Secondary | ICD-10-CM

## 2021-03-19 DIAGNOSIS — E118 Type 2 diabetes mellitus with unspecified complications: Secondary | ICD-10-CM | POA: Diagnosis not present

## 2021-03-19 LAB — CBC WITH DIFFERENTIAL/PLATELET
Abs Immature Granulocytes: 0.03 10*3/uL (ref 0.00–0.07)
Basophils Absolute: 0 10*3/uL (ref 0.0–0.1)
Basophils Relative: 1 %
Eosinophils Absolute: 0.1 10*3/uL (ref 0.0–0.5)
Eosinophils Relative: 2 %
HCT: 39.7 % (ref 36.0–46.0)
Hemoglobin: 12.5 g/dL (ref 12.0–15.0)
Immature Granulocytes: 0 %
Lymphocytes Relative: 21 %
Lymphs Abs: 1.8 10*3/uL (ref 0.7–4.0)
MCH: 23 pg — ABNORMAL LOW (ref 26.0–34.0)
MCHC: 31.5 g/dL (ref 30.0–36.0)
MCV: 73.1 fL — ABNORMAL LOW (ref 80.0–100.0)
Monocytes Absolute: 0.4 10*3/uL (ref 0.1–1.0)
Monocytes Relative: 5 %
Neutro Abs: 6 10*3/uL (ref 1.7–7.7)
Neutrophils Relative %: 71 %
Platelets: 201 10*3/uL (ref 150–400)
RBC: 5.43 MIL/uL — ABNORMAL HIGH (ref 3.87–5.11)
RDW: 17.9 % — ABNORMAL HIGH (ref 11.5–15.5)
WBC: 8.4 10*3/uL (ref 4.0–10.5)
nRBC: 0 % (ref 0.0–0.2)

## 2021-03-19 LAB — BASIC METABOLIC PANEL
Anion gap: 9 (ref 5–15)
BUN: 19 mg/dL (ref 6–20)
CO2: 24 mmol/L (ref 22–32)
Calcium: 9 mg/dL (ref 8.9–10.3)
Chloride: 101 mmol/L (ref 98–111)
Creatinine, Ser: 0.87 mg/dL (ref 0.44–1.00)
GFR, Estimated: >60 mL/min (ref >60)
Glucose, Bld: 259 mg/dL — ABNORMAL HIGH (ref 70–99)
Potassium: 3.5 mmol/L (ref 3.5–5.1)
Sodium: 134 mmol/L — ABNORMAL LOW (ref 135–145)

## 2021-03-22 ENCOUNTER — Encounter (HOSPITAL_COMMUNITY): Payer: Self-pay | Admitting: Anesthesiology

## 2021-03-23 ENCOUNTER — Encounter (HOSPITAL_COMMUNITY): Admission: RE | Payer: Self-pay | Source: Ambulatory Visit

## 2021-03-23 ENCOUNTER — Telehealth: Payer: Self-pay | Admitting: Internal Medicine

## 2021-03-23 ENCOUNTER — Ambulatory Visit (HOSPITAL_COMMUNITY): Admission: RE | Admit: 2021-03-23 | Payer: Medicaid Other | Source: Ambulatory Visit

## 2021-03-23 SURGERY — COLONOSCOPY WITH PROPOFOL
Anesthesia: Monitor Anesthesia Care

## 2021-03-23 NOTE — Telephone Encounter (Signed)
Patient called to reschedule procedure

## 2021-03-23 NOTE — OR Nursing (Signed)
Patient was suppose to arrive this am at [facility] for EGD ,[facility]. No show and called to see if she was coming for her procedure. Stated She did not have a ride so she wasn't coming. Procedure cancelled.

## 2021-03-24 NOTE — Telephone Encounter (Signed)
Tried to call pt, LMOVM for return call. 

## 2021-03-25 NOTE — Telephone Encounter (Signed)
Tried to call pt, LMOVM for return call. 

## 2021-03-26 NOTE — Telephone Encounter (Signed)
Letter mailed

## 2021-03-27 ENCOUNTER — Emergency Department (HOSPITAL_COMMUNITY)
Admission: EM | Admit: 2021-03-27 | Discharge: 2021-03-27 | Disposition: A | Discharge status: Home / Self Care | Payer: Medicaid Other | Attending: Emergency Medicine | Admitting: Emergency Medicine

## 2021-03-27 ENCOUNTER — Ambulatory Visit (HOSPITAL_COMMUNITY): Admission: RE | Admit: 2021-03-27 | Payer: Medicaid Other | Source: Ambulatory Visit

## 2021-03-27 ENCOUNTER — Other Ambulatory Visit: Payer: Self-pay

## 2021-03-27 ENCOUNTER — Encounter (HOSPITAL_COMMUNITY): Payer: Self-pay | Admitting: Emergency Medicine

## 2021-03-27 ENCOUNTER — Emergency Department (HOSPITAL_COMMUNITY): Payer: Medicaid Other

## 2021-03-27 DIAGNOSIS — I110 Hypertensive heart disease with heart failure: Secondary | ICD-10-CM | POA: Diagnosis not present

## 2021-03-27 DIAGNOSIS — Z8616 Personal history of COVID-19: Secondary | ICD-10-CM | POA: Diagnosis not present

## 2021-03-27 DIAGNOSIS — I10 Essential (primary) hypertension: Secondary | ICD-10-CM

## 2021-03-27 DIAGNOSIS — E119 Type 2 diabetes mellitus without complications: Secondary | ICD-10-CM | POA: Insufficient documentation

## 2021-03-27 DIAGNOSIS — Z20822 Contact with and (suspected) exposure to covid-19: Secondary | ICD-10-CM | POA: Insufficient documentation

## 2021-03-27 DIAGNOSIS — Z794 Long term (current) use of insulin: Secondary | ICD-10-CM | POA: Insufficient documentation

## 2021-03-27 DIAGNOSIS — B349 Viral infection, unspecified: Secondary | ICD-10-CM | POA: Diagnosis not present

## 2021-03-27 DIAGNOSIS — I5043 Acute on chronic combined systolic (congestive) and diastolic (congestive) heart failure: Secondary | ICD-10-CM | POA: Insufficient documentation

## 2021-03-27 DIAGNOSIS — Z79899 Other long term (current) drug therapy: Secondary | ICD-10-CM | POA: Insufficient documentation

## 2021-03-27 DIAGNOSIS — R0602 Shortness of breath: Secondary | ICD-10-CM | POA: Diagnosis present

## 2021-03-27 DIAGNOSIS — Z7982 Long term (current) use of aspirin: Secondary | ICD-10-CM | POA: Insufficient documentation

## 2021-03-27 LAB — RESP PANEL BY RT-PCR (FLU A&B, COVID) ARPGX2
Influenza A by PCR: NEGATIVE
Influenza B by PCR: NEGATIVE
SARS Coronavirus 2 by RT PCR: NEGATIVE

## 2021-03-27 LAB — CBC WITH DIFFERENTIAL/PLATELET
Abs Immature Granulocytes: 0.14 10*3/uL — ABNORMAL HIGH (ref 0.00–0.07)
Basophils Absolute: 0.1 10*3/uL (ref 0.0–0.1)
Basophils Relative: 1 %
Eosinophils Absolute: 0.1 10*3/uL (ref 0.0–0.5)
Eosinophils Relative: 1 %
HCT: 34.5 % — ABNORMAL LOW (ref 36.0–46.0)
Hemoglobin: 11 g/dL — ABNORMAL LOW (ref 12.0–15.0)
Immature Granulocytes: 1 %
Lymphocytes Relative: 14 %
Lymphs Abs: 1.3 10*3/uL (ref 0.7–4.0)
MCH: 23.1 pg — ABNORMAL LOW (ref 26.0–34.0)
MCHC: 31.9 g/dL (ref 30.0–36.0)
MCV: 72.5 fL — ABNORMAL LOW (ref 80.0–100.0)
Monocytes Absolute: 0.6 10*3/uL (ref 0.1–1.0)
Monocytes Relative: 6 %
Neutro Abs: 7.6 10*3/uL (ref 1.7–7.7)
Neutrophils Relative %: 77 %
Platelets: 198 10*3/uL (ref 150–400)
RBC: 4.76 MIL/uL (ref 3.87–5.11)
RDW: 16.4 % — ABNORMAL HIGH (ref 11.5–15.5)
WBC: 9.9 10*3/uL (ref 4.0–10.5)
nRBC: 0 % (ref 0.0–0.2)

## 2021-03-27 LAB — BASIC METABOLIC PANEL
Anion gap: 8 (ref 5–15)
BUN: 15 mg/dL (ref 6–20)
CO2: 25 mmol/L (ref 22–32)
Calcium: 8.7 mg/dL — ABNORMAL LOW (ref 8.9–10.3)
Chloride: 101 mmol/L (ref 98–111)
Creatinine, Ser: 0.81 mg/dL (ref 0.44–1.00)
GFR, Estimated: >60 mL/min (ref >60)
Glucose, Bld: 245 mg/dL — ABNORMAL HIGH (ref 70–99)
Potassium: 3.3 mmol/L — ABNORMAL LOW (ref 3.5–5.1)
Sodium: 134 mmol/L — ABNORMAL LOW (ref 135–145)

## 2021-03-27 LAB — TROPONIN I (HIGH SENSITIVITY)
Troponin I (High Sensitivity): 21 ng/L — ABNORMAL HIGH (ref <18)
Troponin I (High Sensitivity): 21 ng/L — ABNORMAL HIGH (ref <18)

## 2021-03-27 LAB — BRAIN NATRIURETIC PEPTIDE: B Natriuretic Peptide: 3001 pg/mL — ABNORMAL HIGH (ref 0.0–100.0)

## 2021-03-27 MED ORDER — ONDANSETRON 4 MG PO TBDP: 4.0000 mg | ORAL_TABLET | Freq: Three times a day (TID) | ORAL | 0 refills | Status: DC | PRN

## 2021-03-27 MED ORDER — FUROSEMIDE 10 MG/ML IJ SOLN
40.0000 mg | Freq: Once | INTRAMUSCULAR | Status: AC
  Administered 2021-03-27: 40 mg via INTRAVENOUS
  Filled 2021-03-27: qty 4

## 2021-03-27 MED ORDER — HYDRALAZINE HCL 20 MG/ML IJ SOLN
10.0000 mg | Freq: Once | INTRAMUSCULAR | Status: AC
  Administered 2021-03-27: 10 mg via INTRAVENOUS
  Filled 2021-03-27: qty 1

## 2021-03-27 MED ORDER — ONDANSETRON HCL 4 MG/2ML IJ SOLN
4.0000 mg | Freq: Once | INTRAMUSCULAR | Status: AC
  Administered 2021-03-27: 4 mg via INTRAVENOUS
  Filled 2021-03-27: qty 2

## 2021-03-27 MED ORDER — ACETAMINOPHEN 500 MG PO TABS
500.0000 mg | ORAL_TABLET | Freq: Once | ORAL | Status: AC
  Administered 2021-03-27: 500 mg via ORAL
  Filled 2021-03-27: qty 1

## 2021-03-27 MED ORDER — IPRATROPIUM BROMIDE 0.02 % IN SOLN
0.5000 mg | Freq: Once | RESPIRATORY_TRACT | Status: AC
  Administered 2021-03-27: 0.5 mg via RESPIRATORY_TRACT
  Filled 2021-03-27: qty 2.5

## 2021-03-27 MED ORDER — ALBUTEROL SULFATE (2.5 MG/3ML) 0.083% IN NEBU
5.0000 mg | INHALATION_SOLUTION | Freq: Once | RESPIRATORY_TRACT | Status: AC
  Administered 2021-03-27: 5 mg via RESPIRATORY_TRACT
  Filled 2021-03-27: qty 6

## 2021-03-27 NOTE — Discharge Instructions (Addendum)
Take the medicines you may have missed this morning. Watch for worsening trouble breathing. Follow with your doctors

## 2021-03-27 NOTE — ED Notes (Signed)
MD made aware of pts hypertension.

## 2021-03-27 NOTE — ED Triage Notes (Signed)
Pt arrived via [facility]. Pt c/o SOB x3 days ago. Pt sats 99% on RA. Pt has flu like symptoms as well.

## 2021-03-27 NOTE — ED Provider Notes (Signed)
[facility] EMERGENCY DEPARTMENT Provider Note   CSN: [id number] Arrival date & time: 03/27/21  0720     History Chief Complaint  Patient presents with   Shortness of Breath    Chelsey Santiago is a 45 y.o. female.   Shortness of Breath Associated symptoms: cough and vomiting   Associated symptoms: no chest pain, no fever and no rash   Patient with shortness of breath.  Has had for around 4 days.  States she has had to use her oxygen at home.  States she is coughing with some brown sputum.  Also states nausea vomiting diarrhea.  Did have COVID around 2 months ago.  No known sick contacts.  History of CHF.  Has inhalers at home but reportedly for her CHF.  States she feels as if her volume status is good.  No abdominal pain.  No chest pain.  Has oxygen as needed at home.    Past Medical History:  Diagnosis Date   Anemia    H&H of 10.6/33 and 07/2008 and 11.9/35 and 09/2010   Anxiety    Chronic combined systolic and diastolic CHF (congestive heart failure) ([place])    a. EF 40-45% by echo in 12/2015 with cath showing normal cors b. EF at 45% by repeat echo in 10/2018 c. EF at 30-35% in 03/2020   Depression with anxiety    Diabetes mellitus without complication ([place])    Diverticulitis    09/2020   Hypertension    Hypertensive heart disease 2009   Pulmonary edema postpartum; mild to moderate mitral regurgitation when hospitalized for CHF in 2009; Echocardiogram in 12/2009-no MR and normal EF; normal CXR in 09/2010   Migraine headache    Miscarriage 03/19/2013   Obesity 04/16/2009   Osteoarthritis, knee 03/29/2011   Preeclampsia    Pulmonary edema    Sleep apnea    Threatened abortion in early pregnancy 03/15/2013    Patient Active Problem List   Diagnosis Date Noted   History of diverticulitis 01/08/2021   Intractable vomiting 10/18/2020   Acute diverticulitis 10/10/2020   Chronic anemia 10/07/2020   Pneumonia due to COVID-19 virus 06/09/2020   Acute on chronic respiratory  failure with hypoxia ([place]) 06/09/2020   Obstructive sleep apnea 03/29/2020   Flash pulmonary edema ([place]) 03/27/2020   Back pain 12/19/2019   Vaginal discharge 12/19/2019   Vaginal itching 12/19/2019   BV (bacterial vaginosis) 12/19/2019   Chronic hypertension 12/19/2019   Fibroids 09/26/2019   S/P hysterectomy 09/26/2019   Acute blood loss anemia 09/26/2019   Pain of upper abdomen 03/14/2019   Dysphagia 03/14/2019   Gastroesophageal reflux disease 03/14/2019   Class 2 obesity    Acute exacerbation of CHF (congestive heart failure) ([place]) 10/26/2018   Chronic combined systolic and diastolic CHF (congestive heart failure) ([place]) 06/02/2018   Uncontrolled type 2 diabetes mellitus with hyperglycemia ([place]) 06/02/2018   Influenza B 05/13/2018   Hypomagnesemia 05/12/2018   Headache 05/12/2018   Upper respiratory tract infection    HCAP (healthcare-associated pneumonia)    Alternating constipation and diarrhea 10/17/2017   Bad headache    CHF exacerbation ([place]) 09/29/2017   Iron deficiency anemia 05/16/2017   Vitamin D deficiency 11/25/2016   Iron deficiency 11/25/2016   History of acute myocardial infarction 10/05/2016   CHF (congestive heart failure) ([place]) 05/06/2016   Diabetes mellitus with complication ([place]) 11/07/7626   Leukocytosis 05/06/2016   Neuropathy 05/06/2016   Depression 04/16/2016   Chronic tension-type headache, not intractable 04/16/2016   AKI (  acute kidney injury) ([place])    Hyperkalemia    Nonischemic cardiomyopathy (HCC)    Acute on chronic combined systolic and diastolic ACC/AHA stage C congestive heart failure ([place]) 04/03/2016   Acute on chronic combined systolic and diastolic CHF, NYHA class 4 ([place]) 04/03/2016   Hypertensive emergency 04/03/2016   Cardiomyopathy due to hypertension ([place]) 12/22/2015   Normal coronary arteries 12/22/2015   Troponin level elevated 12/22/2015   NSTEMI (non-ST elevated myocardial infarction) ([place]) 12/20/2015   Dental infection  10/10/2015   Chest pain 16/02/9603   Systolic CHF, chronic ([place]) 09/05/2015   Left lower quadrant abdominal pain    Type 2 diabetes mellitus without complication ([place]) 54/01/8118   Essential hypertension    Resistant hypertension 04/23/2014   Hypertensive urgency 04/22/2014   Acute CHF ([place]) 04/22/2014   S/P cesarean section 04/11/2014   Acute pulmonary edema ([place]) 04/11/2014   Postoperative anemia 04/11/2014   Elevated serum creatinine 04/11/2014   Preeclampsia, severe 04/09/2014   Pre-eclampsia superimposed on chronic hypertension, antepartum 04/08/2014   Dyspnea 04/08/2014   Polyhydramnios in third trimester, antepartum 03/14/2014   Abnormal maternal glucose tolerance, antepartum 03/11/2014   High-risk pregnancy 03/11/2014   Pre-existing essential hypertension complicating pregnancy 14/78/2956   Impaired glucose tolerance during pregnancy, antepartum 11/27/2013   Leiomyoma of uterus 11/22/2013   History of gestational diabetes in prior pregnancy, currently pregnant in first trimester 11/22/2013   Hx of preeclampsia, prior pregnancy, currently pregnant 11/22/2013   Short interval between pregnancies affecting pregnancy, antepartum 11/22/2013   Supervision of high-risk pregnancy of elderly primigravida (>= 105 years old at delivery), third trimester 11/22/2013   Miscarriage 03/19/2013   Major depressive disorder, single episode, unspecified 09/27/2011   Hypertension    Hypertensive cardiovascular disease    Microcytic anemia    Osteoarthrosis involving lower leg 03/29/2011   Hypokalemia 12/12/2009   OSA on CPAP 12/09/2009   Morbid obesity ([place]) 04/16/2009    Past Surgical History:  Procedure Laterality Date   BREAST REDUCTION SURGERY  2002   CARDIAC CATHETERIZATION N/A 12/22/2015   Procedure: Left Heart Cath and Coronary Angiography;  Surgeon: Peter M Martinique, MD;  Location: [facility];  Service: Cardiovascular;  Laterality: N/A;   CESAREAN SECTION N/A 04/09/2014    Procedure: CESAREAN SECTION;  Surgeon: Mora Bellman, MD;  Location: [facility];  Service: Obstetrics;  Laterality: N/A;   CHOLECYSTECTOMY     HYSTERECTOMY ABDOMINAL WITH SALPINGECTOMY N/A 09/26/2019   Procedure: HYSTERECTOMY ABDOMINAL WITH SALPINGECTOMY;  Surgeon: Florian Buff, MD;  Location: [facility];  Service: Gynecology;  Laterality: N/A;   LIPOMA EXCISION Right 09/26/2019   Procedure: EXCISION LIPOMA RIGHT VULVAR;  Surgeon: Florian Buff, MD;  Location: [facility];  Service: Gynecology;  Laterality: Right;     OB History     Gravida  11   Para  6   Term  5   Preterm  1   AB  5   Living  6      SAB  3   IAB  2   Ectopic      Multiple  0   Live Births  6           Family History  Problem Relation Age of Onset   Diabetes Mother 82   Heart disease Mother    Hyperlipidemia Paternal Grandfather    Hypertension Paternal Grandfather    Heart disease Father    Hypertension Father    Heart disease Maternal Grandmother 61   ADD /  ADHD Son    Hypertension Maternal Uncle    Heart attack Brother    Sudden death Neg Hx    Colon cancer Neg Hx    Celiac disease Neg Hx    Inflammatory bowel disease Neg Hx     Social History   Tobacco Use   Smoking status: Never   Smokeless tobacco: Never  Vaping Use   Vaping Use: Never used  Substance Use Topics   Alcohol use: Yes    Comment: occ   Drug use: No    Home Medications Prior to Admission medications   Medication Sig Start Date End Date Taking? Authorizing Provider  ondansetron (ZOFRAN-ODT) 4 MG disintegrating tablet Take 1 tablet (4 mg total) by mouth every 8 (eight) hours as needed for nausea or vomiting. 03/27/21  Yes Davonna Belling, MD  ACCU-CHEK GUIDE test strip USE AS DIRECTED TO TESTCBLOOD SUGAR TWICE DAILY. 06/11/20   [provider]  albuterol (VENTOLIN HFA) 108 (90 Base) MCG/ACT inhaler Inhale 1 puff into the lungs every 6 (six) hours as needed for wheezing or shortness of breath. 03/30/20   Kathie Dike, MD  amLODipine (NORVASC) 10 MG tablet Take 10 mg by mouth daily. 01/06/21   [provider]  ARIPiprazole (ABILIFY) 10 MG tablet Take 1 tablet (10 mg total) by mouth daily. 10/30/18   Barton Dubois, MD  aspirin EC 81 MG tablet Take 81 mg by mouth daily.    [provider]  benzonatate (TESSALON) 100 MG capsule Take 1 capsule (100 mg total) by mouth every 8 (eight) hours. 02/02/21   Truddie Hidden, MD  budesonide-formoterol [facility]) 80-4.5 MCG/ACT inhaler Inhale 2 puffs into the lungs 2 (two) times daily. 03/30/20   Kathie Dike, MD  carvedilol (COREG) 25 MG tablet Take 1 tablet (25 mg total) by mouth 2 (two) times daily with a meal. 10/13/20   Johnson, Clanford L, MD  clonazePAM (KLONOPIN) 0.5 MG tablet Take 0.5 mg by mouth 2 (two) times daily. 12/19/18   [provider]  dapagliflozin propanediol (FARXIGA) 10 MG TABS tablet Take 1 tablet (10 mg total) by mouth daily before breakfast. 02/20/21   Verta Ellen., NP  gabapentin (NEURONTIN) 800 MG tablet Take 800 mg by mouth 3 (three) times daily. 09/01/20   [provider]  hydrALAZINE (APRESOLINE) 100 MG tablet Take 100 mg by mouth 3 (three) times daily. 01/06/21   [provider]  insulin glargine (LANTUS) 100 UNIT/ML injection Inject 0.35 mLs (35 Units total) into the skin at bedtime. 10/20/20   Barton Dubois, MD  insulin lispro (HUMALOG) 100 UNIT/ML injection Inject 10-16 Units into the skin 3 (three) times daily before meals.    [provider]  Ipratropium-Albuterol (COMBIVENT RESPIMAT) 20-100 MCG/ACT AERS respimat Inhale 1 puff into the lungs every 6 (six) hours as needed for wheezing or shortness of breath. 05/04/18   Manuella Ghazi, Pratik D, DO  isosorbide mononitrate (IMDUR) 30 MG 24 hr tablet Take 1 tablet (30 mg total) by mouth daily. 10/14/20   Johnson, Clanford L, MD  liraglutide (VICTOZA) 18 MG/3ML SOPN Inject 1.2 mg into the skin daily. 09/04/20   Brita Romp, NP   nitroGLYCERIN (NITROSTAT) 0.4 MG SL tablet Place 1 tablet (0.4 mg total) under the tongue every 5 (five) minutes as needed for chest pain. 09/05/15   Kathie Dike, MD  polyethylene glycol-electrolytes (NULYTELY) 420 g solution As directed 02/24/21   Eloise Harman, DO  potassium chloride SA (KLOR-CON) 20 MEQ tablet  Take 20 mEq by mouth daily.    [provider]  pyridostigmine (MESTINON) 60 MG tablet Take 60 mg by mouth every 8 (eight) hours.    [provider]  sacubitril-valsartan (ENTRESTO) 97-103 MG Take 1 tablet by mouth 2 (two) times daily. 03/16/19   Arnoldo Lenis, MD  spironolactone (ALDACTONE) 25 MG tablet Take 1 tablet (25 mg total) by mouth daily. 02/04/21 05/05/21  Imogene Burn, PA-C  torsemide (DEMADEX) 20 MG tablet Take 2 tablets (40 mg total) by mouth 2 (two) times daily. 09/05/20   Verta Ellen., NP  traZODone (DESYREL) 100 MG tablet Take 2 tablets (200 mg total) by mouth at bedtime. 10/29/18   Barton Dubois, MD    Allergies    Diclofenac, Tramadol, and Vicodin [hydrocodone-acetaminophen]  Review of Systems   Review of Systems  Constitutional:  Negative for appetite change, fatigue and fever.  HENT:  Negative for congestion.   Respiratory:  Positive for cough and shortness of breath.   Cardiovascular:  Negative for chest pain and leg swelling.  Gastrointestinal:  Positive for diarrhea, nausea and vomiting.  Genitourinary:  Negative for dysuria.  Musculoskeletal:  Negative for back pain.  Skin:  Negative for rash.  Neurological:  Negative for weakness.  Psychiatric/Behavioral:  Negative for confusion.    Physical Exam Updated Vital Signs BP (!) 173/108   Pulse 85   Temp 98.7 F (37.1 C) (Oral)   Resp (!) 29   Ht 5\' 6"  (1.676 m)   Wt 102.1 kg   LMP 08/21/2019   SpO2 96%   BMI 36.32 kg/m   Physical Exam Vitals and nursing note reviewed.  Constitutional:      Appearance: She is well-developed.  HENT:     Head:  Normocephalic.  Cardiovascular:     Rate and Rhythm: Regular rhythm.  Pulmonary:     Breath sounds: Wheezing present.     Comments: Wheezes bilateral bases. Chest:     Chest wall: No tenderness.  Abdominal:     Tenderness: There is no abdominal tenderness.  Musculoskeletal:     Right lower leg: No tenderness.     Left lower leg: No tenderness.     Comments: Mild edema bilateral lower extremities.  Skin:    Capillary Refill: Capillary refill takes less than 2 seconds.  Neurological:     Mental Status: She is alert and oriented to person, place, and time.    ED Results / Procedures / Treatments   Labs (all labs ordered are listed, but only abnormal results are displayed) Labs Reviewed  CBC WITH DIFFERENTIAL/PLATELET - Abnormal; Notable for the following components:      Result Value   Hemoglobin 11.0 (*)    HCT 34.5 (*)    MCV 72.5 (*)    MCH 23.1 (*)    RDW 16.4 (*)    Abs Immature Granulocytes 0.14 (*)    All other components within normal limits  BRAIN NATRIURETIC PEPTIDE - Abnormal; Notable for the following components:   B Natriuretic Peptide 3,001.0 (*)    All other components within normal limits  BASIC METABOLIC PANEL - Abnormal; Notable for the following components:   Sodium 134 (*)    Potassium 3.3 (*)    Glucose, Bld 245 (*)    Calcium 8.7 (*)    All other components within normal limits  TROPONIN I (HIGH SENSITIVITY) - Abnormal; Notable for the following components:   Troponin I (High Sensitivity) 21 (*)  All other components within normal limits  TROPONIN I (HIGH SENSITIVITY) - Abnormal; Notable for the following components:   Troponin I (High Sensitivity) 21 (*)    All other components within normal limits  RESP PANEL BY RT-PCR (FLU A&B, COVID) [id number]    EKG EKG Interpretation  Date/Time:  Friday March 27 2021 07:31:05 EST Ventricular Rate:  84 PR Interval:  169 QRS Duration: 114 QT Interval:  433 QTC Calculation: 512 R Axis:   22 Text  Interpretation: Sinus rhythm Abnormal R-wave progression, late transition Probable left ventricular hypertrophy Nonspecific T abnormalities, lateral leads Prolonged QT interval No significant change since last tracing Confirmed by Davonna Belling [phone]) on 03/27/2021 7:44:43 AM  Radiology DG Chest Portable 1 View  Result Date: 03/27/2021 CLINICAL DATA:  Shortness of breath, cough EXAM: PORTABLE CHEST 1 VIEW COMPARISON:  Previous studies including the examination of 02/02/2021 FINDINGS: Transverse diameter of heart is increased. There are no signs of pulmonary edema or focal pulmonary consolidation. There is no pleural effusion or pneumothorax. IMPRESSION: Cardiomegaly. There are no signs of pulmonary edema or focal pulmonary consolidation. Electronically Signed   By: Elmer Picker M.D.   On: 03/27/2021 08:09    Procedures Procedures   Medications Ordered in ED Medications  albuterol (PROVENTIL) (2.5 MG/3ML) 0.083% nebulizer solution 5 mg (5 mg Nebulization Given 03/27/21 0821)  ipratropium (ATROVENT) nebulizer solution 0.5 mg (0.5 mg Nebulization Given 03/27/21 0821)  acetaminophen (TYLENOL) tablet 500 mg (500 mg Oral Given 03/27/21 0918)  furosemide (LASIX) injection 40 mg (40 mg Intravenous Given 03/27/21 0917)  hydrALAZINE (APRESOLINE) injection 10 mg (10 mg Intravenous Given 03/27/21 1015)  ondansetron (ZOFRAN) injection 4 mg (4 mg Intravenous Given 03/27/21 1014)    ED Course  I have reviewed the triage vital signs and the nursing notes.  Pertinent labs & imaging results that were available during my care of the patient were reviewed by me and considered in my medical decision making (see chart for details).    MDM Rules/Calculators/A&P                           Patient with URI type symptoms.  Cough.  Some nausea vomiting and diarrhea.  Also feeling some shortness of breath.  States she vomited her medicines up today.  Lab work overall reassuring.  Troponin mildly  elevated.  Likely due to the hypertension.  Also appears somewhat volume overloaded with elevated BNP.  Weight is overall pretty good however.  Given dose of Lasix.  Feels somewhat better.  Had not taken some of her medicines this morning or had vomited them up.  Given short dose of hydralazine which improved somewhat.  Feeling better.  Will take some of her medicines at home.  Will discharge.  I think there is likely underlying viral syndrome.  Has had issues with her hypertension and is high risk to come back but hopefully with her home medications will improve.  Will discharge Final Clinical Impression(s) / ED Diagnoses Final diagnoses:  Viral syndrome  Hypertension, unspecified type    Rx / DC Orders ED Discharge Orders          Ordered    ondansetron (ZOFRAN-ODT) 4 MG disintegrating tablet  Every 8 hours PRN        03/27/21 1134             Davonna Belling, MD 03/27/21 1549

## 2021-04-02 ENCOUNTER — Inpatient Hospital Stay (HOSPITAL_COMMUNITY)
Admission: EM | Admit: 2021-04-02 | Discharge: 2021-04-09 | DRG: 193 | Disposition: A | Discharge status: Home / Self Care | Payer: Medicaid Other | Attending: Internal Medicine | Admitting: Internal Medicine

## 2021-04-02 ENCOUNTER — Encounter (HOSPITAL_COMMUNITY): Payer: Self-pay

## 2021-04-02 ENCOUNTER — Other Ambulatory Visit: Payer: Self-pay

## 2021-04-02 DIAGNOSIS — I43 Cardiomyopathy in diseases classified elsewhere: Secondary | ICD-10-CM | POA: Diagnosis present

## 2021-04-02 DIAGNOSIS — G253 Myoclonus: Secondary | ICD-10-CM | POA: Diagnosis present

## 2021-04-02 DIAGNOSIS — F419 Anxiety disorder, unspecified: Secondary | ICD-10-CM | POA: Diagnosis present

## 2021-04-02 DIAGNOSIS — I5043 Acute on chronic combined systolic (congestive) and diastolic (congestive) heart failure: Secondary | ICD-10-CM | POA: Diagnosis present

## 2021-04-02 DIAGNOSIS — E871 Hypo-osmolality and hyponatremia: Secondary | ICD-10-CM

## 2021-04-02 DIAGNOSIS — T380X5A Adverse effect of glucocorticoids and synthetic analogues, initial encounter: Secondary | ICD-10-CM | POA: Diagnosis present

## 2021-04-02 DIAGNOSIS — Z6836 Body mass index (BMI) 36.0-36.9, adult: Secondary | ICD-10-CM

## 2021-04-02 DIAGNOSIS — Z9049 Acquired absence of other specified parts of digestive tract: Secondary | ICD-10-CM

## 2021-04-02 DIAGNOSIS — Z888 Allergy status to other drugs, medicaments and biological substances status: Secondary | ICD-10-CM

## 2021-04-02 DIAGNOSIS — D509 Iron deficiency anemia, unspecified: Secondary | ICD-10-CM | POA: Diagnosis present

## 2021-04-02 DIAGNOSIS — G629 Polyneuropathy, unspecified: Secondary | ICD-10-CM | POA: Diagnosis present

## 2021-04-02 DIAGNOSIS — Z7951 Long term (current) use of inhaled steroids: Secondary | ICD-10-CM

## 2021-04-02 DIAGNOSIS — Z8249 Family history of ischemic heart disease and other diseases of the circulatory system: Secondary | ICD-10-CM

## 2021-04-02 DIAGNOSIS — G4733 Obstructive sleep apnea (adult) (pediatric): Secondary | ICD-10-CM

## 2021-04-02 DIAGNOSIS — Z20822 Contact with and (suspected) exposure to covid-19: Secondary | ICD-10-CM | POA: Diagnosis present

## 2021-04-02 DIAGNOSIS — J111 Influenza due to unidentified influenza virus with other respiratory manifestations: Secondary | ICD-10-CM

## 2021-04-02 DIAGNOSIS — R1110 Vomiting, unspecified: Secondary | ICD-10-CM

## 2021-04-02 DIAGNOSIS — I110 Hypertensive heart disease with heart failure: Secondary | ICD-10-CM | POA: Diagnosis present

## 2021-04-02 DIAGNOSIS — Z79899 Other long term (current) drug therapy: Secondary | ICD-10-CM

## 2021-04-02 DIAGNOSIS — I5023 Acute on chronic systolic (congestive) heart failure: Secondary | ICD-10-CM

## 2021-04-02 DIAGNOSIS — R9431 Abnormal electrocardiogram [ECG] [EKG]: Secondary | ICD-10-CM

## 2021-04-02 DIAGNOSIS — Z9989 Dependence on other enabling machines and devices: Secondary | ICD-10-CM

## 2021-04-02 DIAGNOSIS — Z794 Long term (current) use of insulin: Secondary | ICD-10-CM

## 2021-04-02 DIAGNOSIS — F32A Depression, unspecified: Secondary | ICD-10-CM | POA: Diagnosis present

## 2021-04-02 DIAGNOSIS — E66812 Obesity, class 2: Secondary | ICD-10-CM

## 2021-04-02 DIAGNOSIS — Z79891 Long term (current) use of opiate analgesic: Secondary | ICD-10-CM

## 2021-04-02 DIAGNOSIS — Z9079 Acquired absence of other genital organ(s): Secondary | ICD-10-CM

## 2021-04-02 DIAGNOSIS — I160 Hypertensive urgency: Secondary | ICD-10-CM | POA: Diagnosis present

## 2021-04-02 DIAGNOSIS — Z7982 Long term (current) use of aspirin: Secondary | ICD-10-CM

## 2021-04-02 DIAGNOSIS — I252 Old myocardial infarction: Secondary | ICD-10-CM

## 2021-04-02 DIAGNOSIS — Z7985 Long-term (current) use of injectable non-insulin antidiabetic drugs: Secondary | ICD-10-CM

## 2021-04-02 DIAGNOSIS — Z885 Allergy status to narcotic agent status: Secondary | ICD-10-CM

## 2021-04-02 DIAGNOSIS — Z90710 Acquired absence of both cervix and uterus: Secondary | ICD-10-CM

## 2021-04-02 DIAGNOSIS — R197 Diarrhea, unspecified: Secondary | ICD-10-CM

## 2021-04-02 DIAGNOSIS — E669 Obesity, unspecified: Secondary | ICD-10-CM | POA: Diagnosis present

## 2021-04-02 DIAGNOSIS — Z833 Family history of diabetes mellitus: Secondary | ICD-10-CM

## 2021-04-02 DIAGNOSIS — K219 Gastro-esophageal reflux disease without esophagitis: Secondary | ICD-10-CM | POA: Diagnosis present

## 2021-04-02 DIAGNOSIS — Z8616 Personal history of COVID-19: Secondary | ICD-10-CM

## 2021-04-02 DIAGNOSIS — J101 Influenza due to other identified influenza virus with other respiratory manifestations: Principal | ICD-10-CM | POA: Diagnosis present

## 2021-04-02 DIAGNOSIS — E1165 Type 2 diabetes mellitus with hyperglycemia: Secondary | ICD-10-CM

## 2021-04-02 DIAGNOSIS — R059 Cough, unspecified: Secondary | ICD-10-CM

## 2021-04-02 DIAGNOSIS — R7989 Other specified abnormal findings of blood chemistry: Secondary | ICD-10-CM

## 2021-04-02 DIAGNOSIS — R0902 Hypoxemia: Secondary | ICD-10-CM | POA: Diagnosis present

## 2021-04-02 DIAGNOSIS — E876 Hypokalemia: Secondary | ICD-10-CM | POA: Diagnosis present

## 2021-04-02 NOTE — ED Triage Notes (Signed)
Pt seen here on 11/11 for flu-like symptoms- pt continues to have cough, congestion, and generalized body aches.

## 2021-04-03 ENCOUNTER — Emergency Department (HOSPITAL_COMMUNITY): Payer: Medicaid Other

## 2021-04-03 ENCOUNTER — Other Ambulatory Visit: Payer: Self-pay

## 2021-04-03 DIAGNOSIS — Z8249 Family history of ischemic heart disease and other diseases of the circulatory system: Secondary | ICD-10-CM | POA: Diagnosis not present

## 2021-04-03 DIAGNOSIS — D509 Iron deficiency anemia, unspecified: Secondary | ICD-10-CM

## 2021-04-03 DIAGNOSIS — E669 Obesity, unspecified: Secondary | ICD-10-CM | POA: Diagnosis present

## 2021-04-03 DIAGNOSIS — Z6836 Body mass index (BMI) 36.0-36.9, adult: Secondary | ICD-10-CM | POA: Diagnosis not present

## 2021-04-03 DIAGNOSIS — R7989 Other specified abnormal findings of blood chemistry: Secondary | ICD-10-CM

## 2021-04-03 DIAGNOSIS — G629 Polyneuropathy, unspecified: Secondary | ICD-10-CM | POA: Diagnosis present

## 2021-04-03 DIAGNOSIS — I5043 Acute on chronic combined systolic (congestive) and diastolic (congestive) heart failure: Secondary | ICD-10-CM | POA: Diagnosis present

## 2021-04-03 DIAGNOSIS — Z20822 Contact with and (suspected) exposure to covid-19: Secondary | ICD-10-CM | POA: Diagnosis present

## 2021-04-03 DIAGNOSIS — G4733 Obstructive sleep apnea (adult) (pediatric): Secondary | ICD-10-CM | POA: Diagnosis present

## 2021-04-03 DIAGNOSIS — Z8616 Personal history of COVID-19: Secondary | ICD-10-CM | POA: Diagnosis not present

## 2021-04-03 DIAGNOSIS — I5021 Acute systolic (congestive) heart failure: Secondary | ICD-10-CM | POA: Diagnosis not present

## 2021-04-03 DIAGNOSIS — J101 Influenza due to other identified influenza virus with other respiratory manifestations: Secondary | ICD-10-CM | POA: Diagnosis present

## 2021-04-03 DIAGNOSIS — Z9989 Dependence on other enabling machines and devices: Secondary | ICD-10-CM

## 2021-04-03 DIAGNOSIS — F419 Anxiety disorder, unspecified: Secondary | ICD-10-CM | POA: Diagnosis present

## 2021-04-03 DIAGNOSIS — I43 Cardiomyopathy in diseases classified elsewhere: Secondary | ICD-10-CM | POA: Diagnosis present

## 2021-04-03 DIAGNOSIS — I252 Old myocardial infarction: Secondary | ICD-10-CM | POA: Diagnosis not present

## 2021-04-03 DIAGNOSIS — R197 Diarrhea, unspecified: Secondary | ICD-10-CM | POA: Diagnosis not present

## 2021-04-03 DIAGNOSIS — E876 Hypokalemia: Secondary | ICD-10-CM | POA: Diagnosis present

## 2021-04-03 DIAGNOSIS — G253 Myoclonus: Secondary | ICD-10-CM | POA: Diagnosis present

## 2021-04-03 DIAGNOSIS — R0902 Hypoxemia: Secondary | ICD-10-CM | POA: Diagnosis present

## 2021-04-03 DIAGNOSIS — F32A Depression, unspecified: Secondary | ICD-10-CM | POA: Diagnosis present

## 2021-04-03 DIAGNOSIS — Z833 Family history of diabetes mellitus: Secondary | ICD-10-CM | POA: Diagnosis not present

## 2021-04-03 DIAGNOSIS — E871 Hypo-osmolality and hyponatremia: Secondary | ICD-10-CM

## 2021-04-03 DIAGNOSIS — E1165 Type 2 diabetes mellitus with hyperglycemia: Secondary | ICD-10-CM | POA: Diagnosis present

## 2021-04-03 DIAGNOSIS — I110 Hypertensive heart disease with heart failure: Secondary | ICD-10-CM | POA: Diagnosis present

## 2021-04-03 DIAGNOSIS — I160 Hypertensive urgency: Secondary | ICD-10-CM | POA: Diagnosis present

## 2021-04-03 DIAGNOSIS — K219 Gastro-esophageal reflux disease without esophagitis: Secondary | ICD-10-CM | POA: Diagnosis present

## 2021-04-03 DIAGNOSIS — R9431 Abnormal electrocardiogram [ECG] [EKG]: Secondary | ICD-10-CM

## 2021-04-03 DIAGNOSIS — R112 Nausea with vomiting, unspecified: Secondary | ICD-10-CM

## 2021-04-03 LAB — CBC WITH DIFFERENTIAL/PLATELET
Abs Immature Granulocytes: 0.07 10*3/uL (ref 0.00–0.07)
Basophils Absolute: 0.1 10*3/uL (ref 0.0–0.1)
Basophils Relative: 1 %
Eosinophils Absolute: 0 10*3/uL (ref 0.0–0.5)
Eosinophils Relative: 0 %
HCT: 36.8 % (ref 36.0–46.0)
Hemoglobin: 11.5 g/dL — ABNORMAL LOW (ref 12.0–15.0)
Immature Granulocytes: 1 %
Lymphocytes Relative: 14 %
Lymphs Abs: 1.2 10*3/uL (ref 0.7–4.0)
MCH: 23.1 pg — ABNORMAL LOW (ref 26.0–34.0)
MCHC: 31.3 g/dL (ref 30.0–36.0)
MCV: 74 fL — ABNORMAL LOW (ref 80.0–100.0)
Monocytes Absolute: 1 10*3/uL (ref 0.1–1.0)
Monocytes Relative: 11 %
Neutro Abs: 6.3 10*3/uL (ref 1.7–7.7)
Neutrophils Relative %: 73 %
Platelets: 252 10*3/uL (ref 150–400)
RBC: 4.97 MIL/uL (ref 3.87–5.11)
RDW: 16.9 % — ABNORMAL HIGH (ref 11.5–15.5)
WBC: 8.7 10*3/uL (ref 4.0–10.5)
nRBC: 0 % (ref 0.0–0.2)

## 2021-04-03 LAB — TROPONIN I (HIGH SENSITIVITY)
Troponin I (High Sensitivity): 30 ng/L — ABNORMAL HIGH (ref <18)
Troponin I (High Sensitivity): 31 ng/L — ABNORMAL HIGH (ref <18)

## 2021-04-03 LAB — IRON AND TIBC
Iron: 34 ug/dL (ref 28–170)
Saturation Ratios: 11 % (ref 10.4–31.8)
TIBC: 323 ug/dL (ref 250–450)
UIBC: 289 ug/dL

## 2021-04-03 LAB — BASIC METABOLIC PANEL
Anion gap: 12 (ref 5–15)
BUN: 15 mg/dL (ref 6–20)
CO2: 28 mmol/L (ref 22–32)
Calcium: 8.1 mg/dL — ABNORMAL LOW (ref 8.9–10.3)
Chloride: 93 mmol/L — ABNORMAL LOW (ref 98–111)
Creatinine, Ser: 1.27 mg/dL — ABNORMAL HIGH (ref 0.44–1.00)
GFR, Estimated: 53 mL/min — ABNORMAL LOW (ref >60)
Glucose, Bld: 236 mg/dL — ABNORMAL HIGH (ref 70–99)
Potassium: 3.2 mmol/L — ABNORMAL LOW (ref 3.5–5.1)
Sodium: 133 mmol/L — ABNORMAL LOW (ref 135–145)

## 2021-04-03 LAB — BLOOD GAS, ARTERIAL
Acid-Base Excess: 7.2 mmol/L — ABNORMAL HIGH (ref 0.0–2.0)
Bicarbonate: 31.2 mmol/L — ABNORMAL HIGH (ref 20.0–28.0)
FIO2: 21
O2 Saturation: 95.6 %
Patient temperature: 36.9
pCO2 arterial: 36.7 mmHg (ref 32.0–48.0)
pH, Arterial: 7.528 — ABNORMAL HIGH (ref 7.350–7.450)
pO2, Arterial: 77.6 mmHg — ABNORMAL LOW (ref 83.0–108.0)

## 2021-04-03 LAB — RESP PANEL BY RT-PCR (FLU A&B, COVID) ARPGX2
Influenza A by PCR: POSITIVE — AB
Influenza B by PCR: NEGATIVE
SARS Coronavirus 2 by RT PCR: NEGATIVE

## 2021-04-03 LAB — BRAIN NATRIURETIC PEPTIDE: B Natriuretic Peptide: 3929 pg/mL — ABNORMAL HIGH (ref 0.0–100.0)

## 2021-04-03 LAB — HEMOGLOBIN A1C
Hgb A1c MFr Bld: 9.5 % — ABNORMAL HIGH (ref 4.8–5.6)
Mean Plasma Glucose: 225.95 mg/dL

## 2021-04-03 LAB — CBG MONITORING, ED
Glucose-Capillary: 238 mg/dL — ABNORMAL HIGH (ref 70–99)
Glucose-Capillary: 232 mg/dL — ABNORMAL HIGH (ref 70–99)

## 2021-04-03 LAB — FERRITIN: Ferritin: 62 ng/mL (ref 11–307)

## 2021-04-03 LAB — GLUCOSE, CAPILLARY
Glucose-Capillary: 175 mg/dL — ABNORMAL HIGH (ref 70–99)
Glucose-Capillary: 186 mg/dL — ABNORMAL HIGH (ref 70–99)

## 2021-04-03 LAB — PHOSPHORUS: Phosphorus: 2.9 mg/dL (ref 2.5–4.6)

## 2021-04-03 LAB — MAGNESIUM: Magnesium: 1.4 mg/dL — ABNORMAL LOW (ref 1.7–2.4)

## 2021-04-03 MED ORDER — OSELTAMIVIR PHOSPHATE 75 MG PO CAPS
75.0000 mg | ORAL_CAPSULE | Freq: Once | ORAL | Status: AC
  Administered 2021-04-03: 75 mg via ORAL
  Filled 2021-04-03: qty 1

## 2021-04-03 MED ORDER — INSULIN ASPART 100 UNIT/ML IJ SOLN: 0.0000 [IU] | Freq: Three times a day (TID) | INTRAMUSCULAR | Status: DC

## 2021-04-03 MED ORDER — CARVEDILOL 12.5 MG PO TABS: 25.0000 mg | ORAL_TABLET | Freq: Two times a day (BID) | ORAL | Status: DC

## 2021-04-03 MED ORDER — ISOSORBIDE MONONITRATE ER 60 MG PO TB24
30.0000 mg | ORAL_TABLET | Freq: Every day | ORAL | Status: DC
  Administered 2021-04-04 – 2021-04-09 (×6): 30 mg via ORAL
  Filled 2021-04-03 (×6): qty 1

## 2021-04-03 MED ORDER — PROCHLORPERAZINE EDISYLATE 10 MG/2ML IJ SOLN
10.0000 mg | Freq: Four times a day (QID) | INTRAMUSCULAR | Status: DC | PRN
  Administered 2021-04-03: 10 mg via INTRAVENOUS
  Filled 2021-04-03: qty 2

## 2021-04-03 MED ORDER — FLUTICASONE FUROATE-VILANTEROL 100-25 MCG/ACT IN AEPB
1.0000 | INHALATION_SPRAY | Freq: Every day | RESPIRATORY_TRACT | Status: DC
  Administered 2021-04-04: 1 via RESPIRATORY_TRACT
  Filled 2021-04-03: qty 28

## 2021-04-03 MED ORDER — POTASSIUM CHLORIDE CRYS ER 20 MEQ PO TBCR
40.0000 meq | EXTENDED_RELEASE_TABLET | Freq: Once | ORAL | Status: AC
  Administered 2021-04-03: 40 meq via ORAL
  Filled 2021-04-03: qty 2

## 2021-04-03 MED ORDER — POTASSIUM CHLORIDE 10 MEQ/100ML IV SOLN
10.0000 meq | INTRAVENOUS | Status: AC
  Administered 2021-04-03 (×2): 10 meq via INTRAVENOUS
  Filled 2021-04-03 (×2): qty 100

## 2021-04-03 MED ORDER — FUROSEMIDE 10 MG/ML IJ SOLN
40.0000 mg | Freq: Two times a day (BID) | INTRAMUSCULAR | Status: AC
  Administered 2021-04-03 (×2): 40 mg via INTRAVENOUS
  Filled 2021-04-03 (×2): qty 4

## 2021-04-03 MED ORDER — HYDRALAZINE HCL 20 MG/ML IJ SOLN
10.0000 mg | Freq: Four times a day (QID) | INTRAMUSCULAR | Status: DC | PRN
  Administered 2021-04-03 – 2021-04-04 (×2): 10 mg via INTRAVENOUS
  Filled 2021-04-03 (×2): qty 1

## 2021-04-03 MED ORDER — INSULIN ASPART 100 UNIT/ML IJ SOLN
0.0000 [IU] | Freq: Every day | INTRAMUSCULAR | Status: DC
  Administered 2021-04-05: 4 [IU] via SUBCUTANEOUS
  Administered 2021-04-06 – 2021-04-08 (×3): 2 [IU] via SUBCUTANEOUS

## 2021-04-03 MED ORDER — ENOXAPARIN SODIUM 40 MG/0.4ML IJ SOSY: 40.0000 mg | PREFILLED_SYRINGE | INTRAMUSCULAR | Status: DC

## 2021-04-03 MED ORDER — SACUBITRIL-VALSARTAN 97-103 MG PO TABS
1.0000 | ORAL_TABLET | Freq: Two times a day (BID) | ORAL | Status: DC
  Administered 2021-04-03 – 2021-04-09 (×12): 1 via ORAL
  Filled 2021-04-03 (×15): qty 1

## 2021-04-03 MED ORDER — ENOXAPARIN SODIUM 40 MG/0.4ML IJ SOSY
40.0000 mg | PREFILLED_SYRINGE | INTRAMUSCULAR | Status: DC
  Administered 2021-04-03 – 2021-04-08 (×5): 40 mg via SUBCUTANEOUS
  Filled 2021-04-03 (×5): qty 0.4

## 2021-04-03 MED ORDER — HYDRALAZINE HCL 25 MG PO TABS
100.0000 mg | ORAL_TABLET | Freq: Three times a day (TID) | ORAL | Status: DC
  Administered 2021-04-04 – 2021-04-09 (×16): 100 mg via ORAL
  Filled 2021-04-03 (×16): qty 4

## 2021-04-03 MED ORDER — INSULIN ASPART 100 UNIT/ML IJ SOLN
0.0000 [IU] | Freq: Three times a day (TID) | INTRAMUSCULAR | Status: DC
  Administered 2021-04-03: 5 [IU] via SUBCUTANEOUS
  Administered 2021-04-03: 3 [IU] via SUBCUTANEOUS
  Administered 2021-04-03: 5 [IU] via SUBCUTANEOUS
  Administered 2021-04-04: 2 [IU] via SUBCUTANEOUS
  Administered 2021-04-04 (×2): 3 [IU] via SUBCUTANEOUS
  Administered 2021-04-05: 8 [IU] via SUBCUTANEOUS
  Administered 2021-04-05: 2 [IU] via SUBCUTANEOUS
  Administered 2021-04-05: 15 [IU] via SUBCUTANEOUS
  Administered 2021-04-06: 8 [IU] via SUBCUTANEOUS
  Administered 2021-04-06: 15 [IU] via SUBCUTANEOUS
  Administered 2021-04-07 (×2): 5 [IU] via SUBCUTANEOUS
  Administered 2021-04-07: 3 [IU] via SUBCUTANEOUS
  Administered 2021-04-08 (×2): 2 [IU] via SUBCUTANEOUS
  Administered 2021-04-08 – 2021-04-09 (×2): 5 [IU] via SUBCUTANEOUS
  Filled 2021-04-03 (×2): qty 1

## 2021-04-03 MED ORDER — CARVEDILOL 12.5 MG PO TABS
25.0000 mg | ORAL_TABLET | Freq: Two times a day (BID) | ORAL | Status: DC
  Administered 2021-04-03 – 2021-04-09 (×13): 25 mg via ORAL
  Filled 2021-04-03 (×13): qty 2

## 2021-04-03 MED ORDER — HYDRALAZINE HCL 25 MG PO TABS
100.0000 mg | ORAL_TABLET | Freq: Once | ORAL | Status: AC
  Administered 2021-04-03: 100 mg via ORAL
  Filled 2021-04-03: qty 4

## 2021-04-03 MED ORDER — SODIUM CHLORIDE 0.9 % IV SOLN
INTRAVENOUS | Status: DC | PRN
  Administered 2021-04-03: 10 mL/h via INTRAVENOUS

## 2021-04-03 MED ORDER — SPIRONOLACTONE 25 MG PO TABS
25.0000 mg | ORAL_TABLET | Freq: Every day | ORAL | Status: DC
  Administered 2021-04-04 – 2021-04-07 (×4): 25 mg via ORAL
  Filled 2021-04-03 (×4): qty 1

## 2021-04-03 MED ORDER — INSULIN GLARGINE-YFGN 100 UNIT/ML ~~LOC~~ SOLN
10.0000 [IU] | Freq: Every day | SUBCUTANEOUS | Status: DC
  Administered 2021-04-03 – 2021-04-04 (×2): 10 [IU] via SUBCUTANEOUS
  Filled 2021-04-03 (×3): qty 0.1

## 2021-04-03 MED ORDER — NITROGLYCERIN 2 % TD OINT
1.0000 [in_us] | TOPICAL_OINTMENT | Freq: Once | TRANSDERMAL | Status: AC
  Administered 2021-04-03: 1 [in_us] via TOPICAL
  Filled 2021-04-03: qty 1

## 2021-04-03 MED ORDER — DAPAGLIFLOZIN PROPANEDIOL 10 MG PO TABS
10.0000 mg | ORAL_TABLET | Freq: Every day | ORAL | Status: DC
  Filled 2021-04-03 (×2): qty 1

## 2021-04-03 MED ORDER — IPRATROPIUM-ALBUTEROL 0.5-2.5 (3) MG/3ML IN SOLN
3.0000 mL | RESPIRATORY_TRACT | Status: DC | PRN
  Administered 2021-04-03 – 2021-04-04 (×2): 3 mL via RESPIRATORY_TRACT
  Filled 2021-04-03 (×2): qty 3

## 2021-04-03 MED ORDER — INSULIN ASPART 100 UNIT/ML IJ SOLN: 0.0000 [IU] | Freq: Every day | INTRAMUSCULAR | Status: DC

## 2021-04-03 MED ORDER — ACETAMINOPHEN 325 MG PO TABS
650.0000 mg | ORAL_TABLET | ORAL | Status: DC | PRN
  Administered 2021-04-03 – 2021-04-04 (×2): 650 mg via ORAL
  Filled 2021-04-03 (×2): qty 2

## 2021-04-03 MED ORDER — DM-GUAIFENESIN ER 30-600 MG PO TB12
1.0000 | ORAL_TABLET | Freq: Two times a day (BID) | ORAL | Status: DC
  Administered 2021-04-03 – 2021-04-08 (×11): 1 via ORAL
  Filled 2021-04-03 (×13): qty 1

## 2021-04-03 MED ORDER — ASPIRIN EC 81 MG PO TBEC
81.0000 mg | DELAYED_RELEASE_TABLET | Freq: Every day | ORAL | Status: DC
  Administered 2021-04-04 – 2021-04-09 (×6): 81 mg via ORAL
  Filled 2021-04-03 (×6): qty 1

## 2021-04-03 MED ORDER — ACETAMINOPHEN 325 MG PO TABS
650.0000 mg | ORAL_TABLET | Freq: Four times a day (QID) | ORAL | Status: DC | PRN
  Administered 2021-04-03: 650 mg via ORAL
  Filled 2021-04-03: qty 2

## 2021-04-03 MED ORDER — SODIUM CHLORIDE 0.9 % IV SOLN: Freq: Once | INTRAVENOUS | Status: DC

## 2021-04-03 MED ORDER — SODIUM CHLORIDE 0.9 % IV SOLN: 250.0000 mL | INTRAVENOUS | Status: DC | PRN

## 2021-04-03 MED ORDER — OSELTAMIVIR PHOSPHATE 75 MG PO CAPS
75.0000 mg | ORAL_CAPSULE | Freq: Two times a day (BID) | ORAL | Status: AC
  Administered 2021-04-03 – 2021-04-07 (×9): 75 mg via ORAL
  Filled 2021-04-03 (×9): qty 1

## 2021-04-03 MED ORDER — SODIUM CHLORIDE 0.9% FLUSH
3.0000 mL | Freq: Two times a day (BID) | INTRAVENOUS | Status: DC
  Administered 2021-04-03 – 2021-04-09 (×9): 3 mL via INTRAVENOUS

## 2021-04-03 MED ORDER — FUROSEMIDE 10 MG/ML IJ SOLN
40.0000 mg | Freq: Once | INTRAMUSCULAR | Status: AC
  Administered 2021-04-03: 40 mg via INTRAVENOUS
  Filled 2021-04-03: qty 4

## 2021-04-03 MED ORDER — CLONAZEPAM 0.5 MG PO TABS
0.5000 mg | ORAL_TABLET | Freq: Two times a day (BID) | ORAL | Status: DC
  Administered 2021-04-04 – 2021-04-09 (×11): 0.5 mg via ORAL
  Filled 2021-04-03 (×11): qty 1

## 2021-04-03 MED ORDER — SODIUM CHLORIDE 0.9% FLUSH: 3.0000 mL | INTRAVENOUS | Status: DC | PRN

## 2021-04-03 MED ORDER — AMLODIPINE BESYLATE 5 MG PO TABS
10.0000 mg | ORAL_TABLET | Freq: Every day | ORAL | Status: DC
  Administered 2021-04-04 – 2021-04-09 (×6): 10 mg via ORAL
  Filled 2021-04-03 (×6): qty 2

## 2021-04-03 MED ORDER — ALBUTEROL SULFATE (2.5 MG/3ML) 0.083% IN NEBU
5.0000 mg | INHALATION_SOLUTION | Freq: Once | RESPIRATORY_TRACT | Status: AC
  Administered 2021-04-03: 5 mg via RESPIRATORY_TRACT
  Filled 2021-04-03: qty 6

## 2021-04-03 MED ORDER — BENZONATATE 100 MG PO CAPS
100.0000 mg | ORAL_CAPSULE | Freq: Once | ORAL | Status: AC
  Administered 2021-04-03: 100 mg via ORAL
  Filled 2021-04-03: qty 1

## 2021-04-03 MED ORDER — OSELTAMIVIR PHOSPHATE 75 MG PO CAPS
75.0000 mg | ORAL_CAPSULE | Freq: Two times a day (BID) | ORAL | Status: DC
Start: 2021-04-03 — End: 2021-04-03

## 2021-04-03 MED ORDER — GUAIFENESIN-DM 100-10 MG/5ML PO SYRP
5.0000 mL | ORAL_SOLUTION | ORAL | Status: DC | PRN
  Administered 2021-04-03 – 2021-04-09 (×4): 5 mL via ORAL
  Filled 2021-04-03 (×5): qty 5

## 2021-04-03 NOTE — ED Notes (Signed)
Dr Christy Gentles at bedside to re-evaluate pt

## 2021-04-03 NOTE — ED Notes (Signed)
Pt more lethargic than assessment this morning. Hospitalist aware and ABG ordered.

## 2021-04-03 NOTE — Progress Notes (Signed)
PROGRESS NOTE  Chelsey Santiago HRC:[id number] DOB: 09-05-1975 DOA: 04/02/2021 PCP: Rosita Fire, MD  Brief History:  45 year old female with a history of systolic and diastolic CHF, hypertensive cardiomyopathy, diabetes mellitus type 2, anxiety, OSA, iron deficiency anemia presenting with 1 week history of shortness of breath, myalgias, generalized weakness, and nonproductive cough.  She has had some subjective fevers and chills.  She also had some nausea and vomiting on the day of admission without any hematemesis, hematochezia, melena.  She describes some intermittent chest discomfort and epigastric discomfort although she has not able to clarify the duration, intensity, or variable factors. Notably, the patient recently saw cardiology on 02/20/2021.  It was noted that the patient had a 10 pound weight gain since 02/02/2021.  The patient was started on Farxiga at that time.  She was instructed to continue torsemide 40 twice daily and weigh herself daily.  The patient has not been weighing herself. In the emergency department, the patient had low-grade temperature 99.4 F.  She was mildly tachycardic with blood pressure up to 201/130.  Chest x-ray showed increasing vascular congestion.  BMP showed sodium 133, potassium 3.2, CO2 28, serum creatinine 1.27.  WBC 8.7, hemoglobin 11.5, platelets 252,000.  Influenza was positive.  Patient was started on oseltamavir and IV furosemide.  Assessment/Plan: Acute on chronic systolic and diastolic CHF -She does not appear to be massively fluid overloaded on exam -I feel that her interstitial markings on chest x-ray are more reflective of the patient's influenza with pulmonary manifestations -Patient given IV furosemide in the ED -give IV furosemide x 24 hours and reassess -09/12/2018 echo EF 35%, grade 2 DD, RVSP 43.5, small pericardial effusion, mild TR -Continue carvedilol, Farxiga, spironolactone, Entresto  Influenza A with pulmonary  manifestations -Continue oseltamivir  Hypertensive urgency -Continue hydralazine, Imdur, carvedilol, spironolactone, amlodipine, Imdur  Uncontrolled diabetes mellitus type 2 with hyperglycemia -Start reduced dose Semglee -NovoLog sliding scale -Hemoglobin A1c  Depression/anxiety -Restart Abilify, clonazepam  Myoclonus -Holding gabapentin temporarily  Atypical chest pain -Personally reviewed EKG--sinus rhythm, nonspecific ST-T wave change -Cycle troponins  Obesity class II -BMI 36.32 -Lifestyle modification      Status is: Inpatient  Remains inpatient appropriate because: severity of illness requiring IV lasix        Family Communication:  no Family at bedside  Consultants:  none  Code Status:  FULL   DVT Prophylaxis:  [place] Lovenox   Procedures: As Listed in Progress Note Above  Antibiotics: None      Subjective: Patient states that she feels generalized weak.  She has been arthralgias.  She denies any vomiting but feels nauseous.  She denies any neck pain, fevers, chills, hematochezia, melena.  Objective: Vitals:   04/03/21 0400 04/03/21 0530 04/03/21 0600 04/03/21 0630  BP: (!) 184/126 (!) 201/130 (!) 180/118 (!) 186/116  Pulse: (!) 102 97 100 (!) 107  Resp: (!) 24 16 (!) 27 (!) 31  Temp:      TempSrc:      SpO2: (!) 86% 97% 95% 97%  Weight:      Height:        Intake/Output Summary (Last 24 hours) at 04/03/2021 0710 Last data filed at 04/03/2021 0516 Gross per 24 hour  Intake --  Output 500 ml  Net -500 ml   Weight change:  Exam:  General:  Pt is alert, follows commands appropriately, not in acute distress HEENT: No icterus, No thrush, No neck mass, [place]/AT Cardiovascular: RRR,  S1/S2, no rubs, no gallops Respiratory: Fine bibasilar crackles.  No wheezing Abdomen: Soft/+BS, non tender, non distended, no guarding Extremities: trace LE edema, No lymphangitis, No petechiae, No rashes, no synovitis   Data Reviewed: I have  personally reviewed following labs and imaging studies Basic Metabolic Panel: Recent Labs  Lab 03/27/21 0743 04/03/21 0051  NA 134* 133*  K 3.3* 3.2*  CL 101 93*  CO2 25 28  GLUCOSE 245* 236*  BUN 15 15  CREATININE 0.81 1.27*  CALCIUM 8.7* 8.1*   Liver Function Tests: No results for input(s): AST, ALT, ALKPHOS, BILITOT, PROT, ALBUMIN in the last 168 hours. No results for input(s): LIPASE, AMYLASE in the last 168 hours. No results for input(s): AMMONIA in the last 168 hours. Coagulation Profile: No results for input(s): INR, PROTIME in the last 168 hours. CBC: Recent Labs  Lab 03/27/21 0743 04/03/21 0051  WBC 9.9 8.7  NEUTROABS 7.6 6.3  HGB 11.0* 11.5*  HCT 34.5* 36.8  MCV 72.5* 74.0*  PLT 198 252   Cardiac Enzymes: No results for input(s): CKTOTAL, CKMB, CKMBINDEX, TROPONINI in the last 168 hours. BNP: Invalid input(s): POCBNP CBG: No results for input(s): GLUCAP in the last 168 hours. HbA1C: No results for input(s): HGBA1C in the last 72 hours. Urine analysis:    Component Value Date/Time   COLORURINE YELLOW 08/28/2019 1226   APPEARANCEUR CLEAR 08/28/2019 1226   LABSPEC 1.028 08/28/2019 1226   PHURINE 6.0 08/28/2019 1226   GLUCOSEU >=500 (A) 08/28/2019 1226   HGBUR NEGATIVE 08/28/2019 1226   BILIRUBINUR NEGATIVE 08/28/2019 1226   KETONESUR NEGATIVE 08/28/2019 1226   PROTEINUR Negative 12/19/2019 1010   PROTEINUR NEGATIVE 08/28/2019 1226   UROBILINOGEN 0.2 04/29/2014 1115   NITRITE n 12/19/2019 1010   NITRITE NEGATIVE 08/28/2019 1226   LEUKOCYTESUR Negative 12/19/2019 1010   LEUKOCYTESUR TRACE (A) 08/28/2019 1226   Sepsis Labs: @LABRCNTIP (procalcitonin:4,lacticidven:4) ) Recent Results (from the past 240 hour(s))  Resp Panel by RT-PCR (Flu A&B, Covid) Nasopharyngeal Swab     Status: None   Collection Time: 03/27/21  8:05 AM   Specimen: Nasopharyngeal Swab; Nasopharyngeal(NP) swabs in vial transport medium  Result Value Ref Range Status   SARS  Coronavirus 2 by RT PCR NEGATIVE NEGATIVE Final    Comment: (NOTE) SARS-CoV-2 target nucleic acids are NOT DETECTED.  The SARS-CoV-2 RNA is generally detectable in upper respiratory specimens during the acute phase of infection. The lowest concentration of SARS-CoV-2 viral copies this assay can detect is 138 copies/mL. A negative result does not preclude SARS-Cov-2 infection and should not be used as the sole basis for treatment or other patient management decisions. A negative result may occur with  improper specimen collection/handling, submission of specimen other than nasopharyngeal swab, presence of viral mutation(s) within the areas targeted by this assay, and inadequate number of viral copies(<138 copies/mL). A negative result must be combined with clinical observations, patient history, and epidemiological information. The expected result is Negative.  Fact Sheet for Patients:  EntrepreneurPulse.com.au  Fact Sheet for Healthcare Providers:  IncredibleEmployment.be  This test is no t yet approved or cleared by the [place] FDA and  has been authorized for detection and/or diagnosis of SARS-CoV-2 by FDA under an Emergency Use Authorization (EUA). This EUA will remain  in effect (meaning this test can be used) for the duration of the COVID-19 declaration under Section 564(b)(1) of the Act, 21 U.S.C.section 360bbb-3(b)(1), unless the authorization is terminated  or revoked sooner.       Influenza  A by PCR NEGATIVE NEGATIVE Final   Influenza B by PCR NEGATIVE NEGATIVE Final    Comment: (NOTE) The Xpert Xpress SARS-CoV-2/FLU/RSV plus assay is intended as an aid in the diagnosis of influenza from Nasopharyngeal swab specimens and should not be used as a sole basis for treatment. Nasal washings and aspirates are unacceptable for Xpert Xpress SARS-CoV-2/FLU/RSV testing.  Fact Sheet for  Patients: EntrepreneurPulse.com.au  Fact Sheet for Healthcare Providers: IncredibleEmployment.be  This test is not yet approved or cleared by the [place] FDA and has been authorized for detection and/or diagnosis of SARS-CoV-2 by FDA under an Emergency Use Authorization (EUA). This EUA will remain in effect (meaning this test can be used) for the duration of the COVID-19 declaration under Section 564(b)(1) of the Act, 21 U.S.C. section 360bbb-3(b)(1), unless the authorization is terminated or revoked.  Performed at [facility], [address], [place], [place] [address]   Resp Panel by RT-PCR (Flu A&B, Covid) Nasopharyngeal Swab     Status: Abnormal   Collection Time: 04/03/21 12:45 AM   Specimen: Nasopharyngeal Swab; Nasopharyngeal(NP) swabs in vial transport medium  Result Value Ref Range Status   SARS Coronavirus 2 by RT PCR NEGATIVE NEGATIVE Final    Comment: (NOTE) SARS-CoV-2 target nucleic acids are NOT DETECTED.  The SARS-CoV-2 RNA is generally detectable in upper respiratory specimens during the acute phase of infection. The lowest concentration of SARS-CoV-2 viral copies this assay can detect is 138 copies/mL. A negative result does not preclude SARS-Cov-2 infection and should not be used as the sole basis for treatment or other patient management decisions. A negative result may occur with  improper specimen collection/handling, submission of specimen other than nasopharyngeal swab, presence of viral mutation(s) within the areas targeted by this assay, and inadequate number of viral copies(<138 copies/mL). A negative result must be combined with clinical observations, patient history, and epidemiological information. The expected result is Negative.  Fact Sheet for Patients:  EntrepreneurPulse.com.au  Fact Sheet for Healthcare Providers:  IncredibleEmployment.be  This test is no t yet  approved or cleared by the [place] FDA and  has been authorized for detection and/or diagnosis of SARS-CoV-2 by FDA under an Emergency Use Authorization (EUA). This EUA will remain  in effect (meaning this test can be used) for the duration of the COVID-19 declaration under Section 564(b)(1) of the Act, 21 U.S.C.section 360bbb-3(b)(1), unless the authorization is terminated  or revoked sooner.       Influenza A by PCR POSITIVE (A) NEGATIVE Final   Influenza B by PCR NEGATIVE NEGATIVE Final    Comment: (NOTE) The Xpert Xpress SARS-CoV-2/FLU/RSV plus assay is intended as an aid in the diagnosis of influenza from Nasopharyngeal swab specimens and should not be used as a sole basis for treatment. Nasal washings and aspirates are unacceptable for Xpert Xpress SARS-CoV-2/FLU/RSV testing.  Fact Sheet for Patients: EntrepreneurPulse.com.au  Fact Sheet for Healthcare Providers: IncredibleEmployment.be  This test is not yet approved or cleared by the [place] FDA and has been authorized for detection and/or diagnosis of SARS-CoV-2 by FDA under an Emergency Use Authorization (EUA). This EUA will remain in effect (meaning this test can be used) for the duration of the COVID-19 declaration under Section 564(b)(1) of the Act, 21 U.S.C. section 360bbb-3(b)(1), unless the authorization is terminated or revoked.  Performed at [facility], [address], [place], [place] [address]      Scheduled Meds:  dextromethorphan-guaiFENesin  1 tablet Oral BID   furosemide  40 mg  Intravenous Q12H   Continuous Infusions:  Procedures/Studies: DG Chest Port 1 View  Result Date: 04/03/2021 CLINICAL DATA:  Cough. EXAM: PORTABLE CHEST 1 VIEW COMPARISON:  Chest radiograph dated 03/27/2021. FINDINGS: Cardiomegaly with mild central vascular congestion. No focal consolidation, pleural effusion, or pneumothorax. No acute osseous pathology. IMPRESSION:  Cardiomegaly with mild central vascular congestion. No focal consolidation. Electronically Signed   By: Anner Crete M.D.   On: 04/03/2021 01:37   DG Chest Portable 1 View  Result Date: 03/27/2021 CLINICAL DATA:  Shortness of breath, cough EXAM: PORTABLE CHEST 1 VIEW COMPARISON:  Previous studies including the examination of 02/02/2021 FINDINGS: Transverse diameter of heart is increased. There are no signs of pulmonary edema or focal pulmonary consolidation. There is no pleural effusion or pneumothorax. IMPRESSION: Cardiomegaly. There are no signs of pulmonary edema or focal pulmonary consolidation. Electronically Signed   By: Elmer Picker M.D.   On: 03/27/2021 08:09    Orson Eva, DO  [facility]  If 7PM-7AM, please contact night-coverage [email or web address] Password [email or web address] 04/03/2021, 7:10 AM   LOS: 0 days

## 2021-04-03 NOTE — ED Notes (Signed)
Pt says she hasn't taken her BP meds today, she doesn't take her meds when she is sick. Dr Christy Gentles made aware.

## 2021-04-03 NOTE — H&P (Signed)
History and Physical  Chelsey Santiago BZJ:[id number] DOB: 1975-12-31 DOA: 04/02/2021  Referring physician: Ripley Fraise, MD PCP: Rosita Fire, MD  Patient coming from: Home  Chief Complaint: Cough, general body aches  HPI: Chelsey Santiago is a 45 y.o. female with medical history significant for combined systolic and diastolic CHF, essential hypertension, type II DM, OSA and depression who presents to the emergency department due to about a week of cough and shortness of breath.  She presented to the ED on 11/11 due to cough and vomiting, breathing treatment was provided, blood pressure was controlled, Lasix was given due to CHF and patient was discharged home with a diagnosis of viral syndrome.  She complained of 3-day onset of watery diarrhea which was about 3 episodes daily and also complaining of worsening cough and generalized body aches.  ED Course:  In the emergency department, she was tachypneic and intermittently tachycardic.  BP was 185/145.  O2 sat was normal on room air.  Work-up in the ED showed BNP 3929.  CBC showed microcytic anemia.  BMP showed hyponatremia, hypokalemia, hyperglycemia.  Influenza A was positive.  Influenza B and SARS coronavirus 2 were negative Chest x-ray showed cardiomegaly with mild central vascular congestion.  No focal consolidation Breathing treatment was provided, hydralazine was given due to elevated BP, Nitropaste was placed, Tamiflu was started.  Hospitalist was asked to admit patient for further evaluation and management.  Review of Systems: Constitutional: Negative for chills and body aches.  HENT: Negative for ear pain and sore throat.   Eyes: Negative for pain and visual disturbance.  Respiratory: Positive for cough, negative for chest tightness and shortness of breath.   Cardiovascular: Positive for leg swelling.  Negative for chest pain and palpitations.  Gastrointestinal: Negative for abdominal pain and vomiting.  Endocrine: Negative for  polyphagia and polyuria.  Genitourinary: Negative for decreased urine volume, dysuria, enuresis Musculoskeletal: Negative for arthralgias and back pain.  Skin: Negative for color change and rash.  Allergic/Immunologic: Negative for immunocompromised state.  Neurological: Positive for weakness.  Negative for tremors, syncope, speech difficulty and headaches.  Hematological: Does not bruise/bleed easily.  All other systems reviewed and are negative   Past Medical History:  Diagnosis Date   Anemia    H&H of 10.6/33 and 07/2008 and 11.9/35 and 09/2010   Anxiety    Chronic combined systolic and diastolic CHF (congestive heart failure) ([place])    a. EF 40-45% by echo in 12/2015 with cath showing normal cors b. EF at 45% by repeat echo in 10/2018 c. EF at 30-35% in 03/2020   Depression with anxiety    Diabetes mellitus without complication ([place])    Diverticulitis    09/2020   Hypertension    Hypertensive heart disease 2009   Pulmonary edema postpartum; mild to moderate mitral regurgitation when hospitalized for CHF in 2009; Echocardiogram in 12/2009-no MR and normal EF; normal CXR in 09/2010   Migraine headache    Miscarriage 03/19/2013   Obesity 04/16/2009   Osteoarthritis, knee 03/29/2011   Preeclampsia    Pulmonary edema    Sleep apnea    Threatened abortion in early pregnancy 03/15/2013   Past Surgical History:  Procedure Laterality Date   BREAST REDUCTION SURGERY  2002   CARDIAC CATHETERIZATION N/A 12/22/2015   Procedure: Left Heart Cath and Coronary Angiography;  Surgeon: Peter M Martinique, MD;  Location: [facility];  Service: Cardiovascular;  Laterality: N/A;   CESAREAN SECTION N/A 04/09/2014   Procedure: CESAREAN SECTION;  Surgeon:  Mora Bellman, MD;  Location: [facility];  Service: Obstetrics;  Laterality: N/A;   CHOLECYSTECTOMY     HYSTERECTOMY ABDOMINAL WITH SALPINGECTOMY N/A 09/26/2019   Procedure: HYSTERECTOMY ABDOMINAL WITH SALPINGECTOMY;  Surgeon: Florian Buff, MD;   Location: [facility];  Service: Gynecology;  Laterality: N/A;   LIPOMA EXCISION Right 09/26/2019   Procedure: EXCISION LIPOMA RIGHT VULVAR;  Surgeon: Florian Buff, MD;  Location: [facility];  Service: Gynecology;  Laterality: Right;    Social History:  reports that she has never smoked. She has never used smokeless tobacco. She reports current alcohol use. She reports that she does not use drugs.   Allergies  Allergen Reactions   Diclofenac Swelling    AND POSSIBLE SYNCOPE; tolerates ibuprofen per pt   Tramadol Nausea And Vomiting and Nausea Only    Itching (12/21); tolerates ibuprofen per pt   Vicodin [Hydrocodone-Acetaminophen] Itching and Nausea Only    Family History  Problem Relation Age of Onset   Diabetes Mother 92   Heart disease Mother    Hyperlipidemia Paternal Grandfather    Hypertension Paternal Grandfather    Heart disease Father    Hypertension Father    Heart disease Maternal Grandmother 60   ADD / ADHD Son    Hypertension Maternal Uncle    Heart attack Brother    Sudden death Neg Hx    Colon cancer Neg Hx    Celiac disease Neg Hx    Inflammatory bowel disease Neg Hx      Prior to Admission medications   Medication Sig Start Date End Date Taking? Authorizing Provider  ACCU-CHEK GUIDE test strip USE AS DIRECTED TO TESTCBLOOD SUGAR TWICE DAILY. 06/11/20   [provider]  albuterol (VENTOLIN HFA) 108 (90 Base) MCG/ACT inhaler Inhale 1 puff into the lungs every 6 (six) hours as needed for wheezing or shortness of breath. 03/30/20   Kathie Dike, MD  amLODipine (NORVASC) 10 MG tablet Take 10 mg by mouth daily. 01/06/21   [provider]  ARIPiprazole (ABILIFY) 10 MG tablet Take 1 tablet (10 mg total) by mouth daily. 10/30/18   Barton Dubois, MD  aspirin EC 81 MG tablet Take 81 mg by mouth daily.    [provider]  benzonatate (TESSALON) 100 MG capsule Take 1 capsule (100 mg total) by mouth every 8 (eight) hours. 02/02/21   Truddie Hidden,  MD  budesonide-formoterol [facility]) 80-4.5 MCG/ACT inhaler Inhale 2 puffs into the lungs 2 (two) times daily. 03/30/20   Kathie Dike, MD  carvedilol (COREG) 25 MG tablet Take 1 tablet (25 mg total) by mouth 2 (two) times daily with a meal. 10/13/20   Johnson, Clanford L, MD  clonazePAM (KLONOPIN) 0.5 MG tablet Take 0.5 mg by mouth 2 (two) times daily. 12/19/18   [provider]  dapagliflozin propanediol (FARXIGA) 10 MG TABS tablet Take 1 tablet (10 mg total) by mouth daily before breakfast. 02/20/21   Verta Ellen., NP  gabapentin (NEURONTIN) 800 MG tablet Take 800 mg by mouth 3 (three) times daily. 09/01/20   [provider]  hydrALAZINE (APRESOLINE) 100 MG tablet Take 100 mg by mouth 3 (three) times daily. 01/06/21   [provider]  insulin glargine (LANTUS) 100 UNIT/ML injection Inject 0.35 mLs (35 Units total) into the skin at bedtime. 10/20/20   Barton Dubois, MD  insulin lispro (HUMALOG) 100 UNIT/ML injection Inject 10-16 Units into the skin 3 (three) times daily before meals.    [provider]  Ipratropium-Albuterol (COMBIVENT RESPIMAT) 20-100 MCG/ACT AERS respimat Inhale 1 puff into the lungs every 6 (six) hours as needed for wheezing or shortness of breath. 05/04/18   Manuella Ghazi, Pratik D, DO  isosorbide mononitrate (IMDUR) 30 MG 24 hr tablet Take 1 tablet (30 mg total) by mouth daily. 10/14/20   Johnson, Clanford L, MD  liraglutide (VICTOZA) 18 MG/3ML SOPN Inject 1.2 mg into the skin daily. 09/04/20   Brita Romp, NP  nitroGLYCERIN (NITROSTAT) 0.4 MG SL tablet Place 1 tablet (0.4 mg total) under the tongue every 5 (five) minutes as needed for chest pain. 09/05/15   Kathie Dike, MD  ondansetron (ZOFRAN-ODT) 4 MG disintegrating tablet Take 1 tablet (4 mg total) by mouth every 8 (eight) hours as needed for nausea or vomiting. 03/27/21   Davonna Belling, MD  polyethylene glycol-electrolytes (NULYTELY) 420 g solution As directed 02/24/21   Eloise Harman, DO  potassium chloride SA (KLOR-CON) 20 MEQ tablet Take 20 mEq by mouth daily.    [provider]  pyridostigmine (MESTINON) 60 MG tablet Take 60 mg by mouth every 8 (eight) hours.    [provider]  sacubitril-valsartan (ENTRESTO) 97-103 MG Take 1 tablet by mouth 2 (two) times daily. 03/16/19   Arnoldo Lenis, MD  spironolactone (ALDACTONE) 25 MG tablet Take 1 tablet (25 mg total) by mouth daily. 02/04/21 05/05/21  Imogene Burn, PA-C  torsemide (DEMADEX) 20 MG tablet Take 2 tablets (40 mg total) by mouth 2 (two) times daily. 09/05/20   Verta Ellen., NP  traZODone (DESYREL) 100 MG tablet Take 2 tablets (200 mg total) by mouth at bedtime. 10/29/18   Barton Dubois, MD    Physical Exam: BP (!) 181/115   Pulse (!) 104   Temp 98.4 F (36.9 C) (Oral)   Resp (!) 27   Ht 5\' 6"  (1.676 m)   Wt 102.1 kg   LMP 08/21/2019   SpO2 94%   BMI 36.32 kg/m   General: 44 y.o. year-old female well developed well nourished in no acute distress.  Alert and oriented x3. HEENT: NCAT, EOMI Neck: Supple, trachea medial Cardiovascular: Regular rate and rhythm with no rubs or gallops.  No thyromegaly or JVD noted.  2/4 pulses in all 4 extremities. Respiratory: Tachypnea.  Coughing during exam.  Decreased breath sounds on auscultation  Abdomen: Soft, nontender nondistended with normal bowel sounds x4 quadrants. Muskuloskeletal: Bilateral lower extremity edema.  No cyanosis or clubbing  Neuro: CN II-XII intact, strength 5/5 x 4, sensation, reflexes intact Skin: No ulcerative lesions noted or rashes Psychiatry: Judgement and insight appear normal. Mood is appropriate for condition and setting          Labs on Admission:  Basic Metabolic Panel: Recent Labs  Lab 03/27/21 0743 04/03/21 0051  NA 134* 133*  K 3.3* 3.2*  CL 101 93*  CO2 25 28  GLUCOSE 245* 236*  BUN 15 15  CREATININE 0.81 1.27*  CALCIUM 8.7* 8.1*   Liver Function Tests: No results for input(s):  AST, ALT, ALKPHOS, BILITOT, PROT, ALBUMIN in the last 168 hours. No results for input(s): LIPASE, AMYLASE in the last 168 hours. No results for input(s): AMMONIA in the last 168 hours. CBC: Recent Labs  Lab 03/27/21 0743 04/03/21 0051  WBC 9.9 8.7  NEUTROABS 7.6 6.3  HGB 11.0* 11.5*  HCT 34.5* 36.8  MCV 72.5* 74.0*  PLT 198 252   Cardiac Enzymes: No results for input(s): CKTOTAL, CKMB, CKMBINDEX, TROPONINI in the last  168 hours.  BNP (last 3 results) Recent Labs    02/02/21 1131 03/27/21 0743 04/03/21 0051  BNP 2,318.0* 3,001.0* 3,929.0*    ProBNP (last 3 results) No results for input(s): PROBNP in the last 8760 hours.  CBG: No results for input(s): GLUCAP in the last 168 hours.  Radiological Exams on Admission: DG Chest Port 1 View  Result Date: 04/03/2021 CLINICAL DATA:  Cough. EXAM: PORTABLE CHEST 1 VIEW COMPARISON:  Chest radiograph dated 03/27/2021. FINDINGS: Cardiomegaly with mild central vascular congestion. No focal consolidation, pleural effusion, or pneumothorax. No acute osseous pathology. IMPRESSION: Cardiomegaly with mild central vascular congestion. No focal consolidation. Electronically Signed   By: Anner Crete M.D.   On: 04/03/2021 01:37    EKG: I independently viewed the EKG done and my findings are as followed: Normal sinus rhythm at a rate of 94 bpm with prolonged QTc (508 ms)  Assessment/Plan Present on Admission:  Influenza A  Acute on chronic combined systolic and diastolic ACC/AHA stage C congestive heart failure (HCC)  Hypertensive urgency  Hypokalemia  Microcytic anemia  Principal Problem:   Influenza A Active Problems:   Hypokalemia   OSA on CPAP   Microcytic anemia   Hypertensive urgency   Acute on chronic combined systolic and diastolic ACC/AHA stage C congestive heart failure (HCC)   Diarrhea   Hyperglycemia due to diabetes mellitus (HCC)   Vomiting   Prolonged QT interval   Hyponatremia   Elevated brain natriuretic  peptide (BNP) level   Influenza A  Influenza A was positive, influenza B and SARS coronavirus 2 were negative Continue Tamiflu, Mucinex, Robitussin, DuoNebs, albuterol, benzonatate  Acute on chronic CHF exacerbation Elevated BNP-3929  BNP was 3001 on 11/11 and 3754 on 10/18/2020 Continue total input/output, daily weights and fluid restriction Continue IV Lasix 40 mg twice daily, aspirin, Coreg, Entresto and Aldactone Continue Cardiac diet  Echocardiogram done in April 2022 showed LVEF of 35%, LV with moderate decreased function, LV global hypokinesis, LV diastolic parameters with G2 diastolic dysfunction  Hypertensive urgency  Essential hypertension (uncontrolled) Continue IV hydralazine 10 mg every 6 hours as needed for SBP > 170 Continue home hydralazine, amlodipine  Vomiting, diarrhea  Continue Compazine as needed Patient has not had diarrhea since arrival to the ED  Hypokalemia, hyponatremia K+ 3.2, Na 133 This is possibly secondary to diuretic effect Correct electrolytes  Prolonged QTc (508 ms) Avoid QT prolonging drugs Magnesium level will be checked  Microcytic anemia Iron studies will be checked  Hyperglycemia secondary to type 2 diabetes mellitus Continue ISS and hypoglycemic protocol Continue home Farxiga Continue lantus10 units and adjust dose accordingly  Obstructive sleep apnea/class II obesity (BMI 36.32 kg/m) Continue CPAP Patient counseled on diet and lifestyle modification   DVT prophylaxis: Lovenox  Code Status: Full code  Family Communication: None at bedside  Disposition Plan:  Patient is from:                        home Anticipated DC to:                   SNF or family members home Anticipated DC date:               2-3 days Anticipated DC barriers:          Patient requires inpatient management due to influenza A and acute on chronic CHF exacerbation   Consults called: None  Admission status: Inpatient    Bernadette Hoit  MD [facility]  04/03/2021, 7:36 AM

## 2021-04-03 NOTE — ED Notes (Signed)
Bedside toilet at pt bedside.

## 2021-04-03 NOTE — ED Provider Notes (Signed)
[facility]: [facility] EMERGENCY DEPARTMENT Provider Note   CSN: [id number] Arrival date & time: 04/02/21  2211     History Chief Complaint  Patient presents with   Cough   Generalized Body Aches    Chelsey Santiago is a 45 y.o. female.  The history is provided by the patient.  Cough Severity:  Moderate Onset quality:  Gradual Duration:  1 week Timing:  Intermittent Progression:  Worsening Chronicity:  New Relieved by:  Nothing Worsened by:  Nothing Associated symptoms: chills   Associated symptoms: no fever   Patient with history of CHF, obesity, hypertension presents with cough and shortness of breath.  Patient has had symptoms for up to a week.  She reports cough, now having vomiting and diarrhea.  She reports shortness of breath She reports she is sore throughout her body from the coughing.  No hemoptysis.  No blood in her vomit or stool.    Past Medical History:  Diagnosis Date   Anemia    H&H of 10.6/33 and 07/2008 and 11.9/35 and 09/2010   Anxiety    Chronic combined systolic and diastolic CHF (congestive heart failure) ([place])    a. EF 40-45% by echo in 12/2015 with cath showing normal cors b. EF at 45% by repeat echo in 10/2018 c. EF at 30-35% in 03/2020   Depression with anxiety    Diabetes mellitus without complication ([place])    Diverticulitis    09/2020   Hypertension    Hypertensive heart disease 2009   Pulmonary edema postpartum; mild to moderate mitral regurgitation when hospitalized for CHF in 2009; Echocardiogram in 12/2009-no MR and normal EF; normal CXR in 09/2010   Migraine headache    Miscarriage 03/19/2013   Obesity 04/16/2009   Osteoarthritis, knee 03/29/2011   Preeclampsia    Pulmonary edema    Sleep apnea    Threatened abortion in early pregnancy 03/15/2013    Patient Active Problem List   Diagnosis Date Noted   History of diverticulitis 01/08/2021   Intractable vomiting 10/18/2020   Acute diverticulitis 10/10/2020   Chronic anemia 10/07/2020    Pneumonia due to COVID-19 virus 06/09/2020   Acute on chronic respiratory failure with hypoxia ([place]) 06/09/2020   Obstructive sleep apnea 03/29/2020   Flash pulmonary edema ([place]) 03/27/2020   Back pain 12/19/2019   Vaginal discharge 12/19/2019   Vaginal itching 12/19/2019   BV (bacterial vaginosis) 12/19/2019   Chronic hypertension 12/19/2019   Fibroids 09/26/2019   S/P hysterectomy 09/26/2019   Acute blood loss anemia 09/26/2019   Pain of upper abdomen 03/14/2019   Dysphagia 03/14/2019   Gastroesophageal reflux disease 03/14/2019   Class 2 obesity    Acute exacerbation of CHF (congestive heart failure) ([place]) 10/26/2018   Chronic combined systolic and diastolic CHF (congestive heart failure) ([place]) 06/02/2018   Uncontrolled type 2 diabetes mellitus with hyperglycemia ([place]) 06/02/2018   Influenza B 05/13/2018   Hypomagnesemia 05/12/2018   Headache 05/12/2018   Upper respiratory tract infection    HCAP (healthcare-associated pneumonia)    Alternating constipation and diarrhea 10/17/2017   Bad headache    CHF exacerbation ([place]) 09/29/2017   Iron deficiency anemia 05/16/2017   Vitamin D deficiency 11/25/2016   Iron deficiency 11/25/2016   History of acute myocardial infarction 10/05/2016   CHF (congestive heart failure) ([place]) 05/06/2016   Diabetes mellitus with complication ([place]) 44/07/4740   Leukocytosis 05/06/2016   Neuropathy 05/06/2016   Depression 04/16/2016   Chronic tension-type headache, not intractable 04/16/2016   AKI (acute kidney  injury) ([place])    Hyperkalemia    Nonischemic cardiomyopathy ([place])    Acute on chronic combined systolic and diastolic ACC/AHA stage C congestive heart failure ([place]) 04/03/2016   Acute on chronic combined systolic and diastolic CHF, NYHA class 4 ([place]) 04/03/2016   Hypertensive emergency 04/03/2016   Cardiomyopathy due to hypertension ([place]) 12/22/2015   Normal coronary arteries 12/22/2015   Troponin level elevated 12/22/2015   NSTEMI  (non-ST elevated myocardial infarction) ([place]) 12/20/2015   Dental infection 10/10/2015   Chest pain 03/02/5101   Systolic CHF, chronic ([place]) 09/05/2015   Left lower quadrant abdominal pain    Type 2 diabetes mellitus without complication ([place]) 58/52/7782   Essential hypertension    Resistant hypertension 04/23/2014   Hypertensive urgency 04/22/2014   Acute CHF ([place]) 04/22/2014   S/P cesarean section 04/11/2014   Acute pulmonary edema ([place]) 04/11/2014   Postoperative anemia 04/11/2014   Elevated serum creatinine 04/11/2014   Preeclampsia, severe 04/09/2014   Pre-eclampsia superimposed on chronic hypertension, antepartum 04/08/2014   Dyspnea 04/08/2014   Polyhydramnios in third trimester, antepartum 03/14/2014   Abnormal maternal glucose tolerance, antepartum 03/11/2014   High-risk pregnancy 03/11/2014   Pre-existing essential hypertension complicating pregnancy 42/35/3614   Impaired glucose tolerance during pregnancy, antepartum 11/27/2013   Leiomyoma of uterus 11/22/2013   History of gestational diabetes in prior pregnancy, currently pregnant in first trimester 11/22/2013   Hx of preeclampsia, prior pregnancy, currently pregnant 11/22/2013   Short interval between pregnancies affecting pregnancy, antepartum 11/22/2013   Supervision of high-risk pregnancy of elderly primigravida (>= 49 years old at delivery), third trimester 11/22/2013   Miscarriage 03/19/2013   Major depressive disorder, single episode, unspecified 09/27/2011   Hypertension    Hypertensive cardiovascular disease    Microcytic anemia    Osteoarthrosis involving lower leg 03/29/2011   Hypokalemia 12/12/2009   OSA on CPAP 12/09/2009   Morbid obesity ([place]) 04/16/2009    Past Surgical History:  Procedure Laterality Date   BREAST REDUCTION SURGERY  2002   CARDIAC CATHETERIZATION N/A 12/22/2015   Procedure: Left Heart Cath and Coronary Angiography;  Surgeon: Peter M Martinique, MD;  Location: [facility];   Service: Cardiovascular;  Laterality: N/A;   CESAREAN SECTION N/A 04/09/2014   Procedure: CESAREAN SECTION;  Surgeon: Mora Bellman, MD;  Location: [facility];  Service: Obstetrics;  Laterality: N/A;   CHOLECYSTECTOMY     HYSTERECTOMY ABDOMINAL WITH SALPINGECTOMY N/A 09/26/2019   Procedure: HYSTERECTOMY ABDOMINAL WITH SALPINGECTOMY;  Surgeon: Florian Buff, MD;  Location: [facility];  Service: Gynecology;  Laterality: N/A;   LIPOMA EXCISION Right 09/26/2019   Procedure: EXCISION LIPOMA RIGHT VULVAR;  Surgeon: Florian Buff, MD;  Location: [facility];  Service: Gynecology;  Laterality: Right;     OB History     Gravida  11   Para  6   Term  5   Preterm  1   AB  5   Living  6      SAB  3   IAB  2   Ectopic      Multiple  0   Live Births  6           Family History  Problem Relation Age of Onset   Diabetes Mother 26   Heart disease Mother    Hyperlipidemia Paternal Grandfather    Hypertension Paternal Grandfather    Heart disease Father    Hypertension Father    Heart disease Maternal Grandmother 52   ADD / ADHD  Son    Hypertension Maternal Uncle    Heart attack Brother    Sudden death Neg Hx    Colon cancer Neg Hx    Celiac disease Neg Hx    Inflammatory bowel disease Neg Hx     Social History   Tobacco Use   Smoking status: Never   Smokeless tobacco: Never  Vaping Use   Vaping Use: Never used  Substance Use Topics   Alcohol use: Yes    Comment: occ   Drug use: No    Home Medications Prior to Admission medications   Medication Sig Start Date End Date Taking? Authorizing Provider  ACCU-CHEK GUIDE test strip USE AS DIRECTED TO TESTCBLOOD SUGAR TWICE DAILY. 06/11/20   [provider]  albuterol (VENTOLIN HFA) 108 (90 Base) MCG/ACT inhaler Inhale 1 puff into the lungs every 6 (six) hours as needed for wheezing or shortness of breath. 03/30/20   Kathie Dike, MD  amLODipine (NORVASC) 10 MG tablet Take 10 mg by mouth daily. 01/06/21   [provider]  ARIPiprazole (ABILIFY) 10 MG tablet Take 1 tablet (10 mg total) by mouth daily. 10/30/18   Barton Dubois, MD  aspirin EC 81 MG tablet Take 81 mg by mouth daily.    [provider]  benzonatate (TESSALON) 100 MG capsule Take 1 capsule (100 mg total) by mouth every 8 (eight) hours. 02/02/21   Truddie Hidden, MD  budesonide-formoterol [facility]) 80-4.5 MCG/ACT inhaler Inhale 2 puffs into the lungs 2 (two) times daily. 03/30/20   Kathie Dike, MD  carvedilol (COREG) 25 MG tablet Take 1 tablet (25 mg total) by mouth 2 (two) times daily with a meal. 10/13/20   Johnson, Clanford L, MD  clonazePAM (KLONOPIN) 0.5 MG tablet Take 0.5 mg by mouth 2 (two) times daily. 12/19/18   [provider]  dapagliflozin propanediol (FARXIGA) 10 MG TABS tablet Take 1 tablet (10 mg total) by mouth daily before breakfast. 02/20/21   Verta Ellen., NP  gabapentin (NEURONTIN) 800 MG tablet Take 800 mg by mouth 3 (three) times daily. 09/01/20   [provider]  hydrALAZINE (APRESOLINE) 100 MG tablet Take 100 mg by mouth 3 (three) times daily. 01/06/21   [provider]  insulin glargine (LANTUS) 100 UNIT/ML injection Inject 0.35 mLs (35 Units total) into the skin at bedtime. 10/20/20   Barton Dubois, MD  insulin lispro (HUMALOG) 100 UNIT/ML injection Inject 10-16 Units into the skin 3 (three) times daily before meals.    [provider]  Ipratropium-Albuterol (COMBIVENT RESPIMAT) 20-100 MCG/ACT AERS respimat Inhale 1 puff into the lungs every 6 (six) hours as needed for wheezing or shortness of breath. 05/04/18   Manuella Ghazi, Pratik D, DO  isosorbide mononitrate (IMDUR) 30 MG 24 hr tablet Take 1 tablet (30 mg total) by mouth daily. 10/14/20   Johnson, Clanford L, MD  liraglutide (VICTOZA) 18 MG/3ML SOPN Inject 1.2 mg into the skin daily. 09/04/20   Brita Romp, NP  nitroGLYCERIN (NITROSTAT) 0.4 MG SL tablet Place 1 tablet (0.4 mg total) under the tongue every 5  (five) minutes as needed for chest pain. 09/05/15   Kathie Dike, MD  ondansetron (ZOFRAN-ODT) 4 MG disintegrating tablet Take 1 tablet (4 mg total) by mouth every 8 (eight) hours as needed for nausea or vomiting. 03/27/21   Davonna Belling, MD  polyethylene glycol-electrolytes (NULYTELY) 420 g solution As directed 02/24/21   Eloise Harman, DO  potassium chloride SA (KLOR-CON) 20 MEQ tablet Take  20 mEq by mouth daily.    [provider]  pyridostigmine (MESTINON) 60 MG tablet Take 60 mg by mouth every 8 (eight) hours.    [provider]  sacubitril-valsartan (ENTRESTO) 97-103 MG Take 1 tablet by mouth 2 (two) times daily. 03/16/19   Arnoldo Lenis, MD  spironolactone (ALDACTONE) 25 MG tablet Take 1 tablet (25 mg total) by mouth daily. 02/04/21 05/05/21  Imogene Burn, PA-C  torsemide (DEMADEX) 20 MG tablet Take 2 tablets (40 mg total) by mouth 2 (two) times daily. 09/05/20   Verta Ellen., NP  traZODone (DESYREL) 100 MG tablet Take 2 tablets (200 mg total) by mouth at bedtime. 10/29/18   Barton Dubois, MD    Allergies    Diclofenac, Tramadol, and Vicodin [hydrocodone-acetaminophen]  Review of Systems   Review of Systems  Constitutional:  Positive for chills and fatigue. Negative for fever.  Respiratory:  Positive for cough.   Cardiovascular:  Positive for leg swelling.       Reports chest soreness  Gastrointestinal:  Positive for diarrhea and vomiting.  All other systems reviewed and are negative.  Physical Exam Updated Vital Signs BP (!) 187/120   Pulse 97   Temp 99.4 F (37.4 C) (Oral)   Resp (!) 24   Ht 1.676 m (5\' 6" )   Wt 102.1 kg   LMP 08/21/2019   SpO2 97%   BMI 36.32 kg/m   Physical Exam CONSTITUTIONAL: Well developed/well nourished, coughs frequently during exam HEAD: Normocephalic/atraumatic EYES: EOMI/PERRL ENMT: Mucous membranes moist NECK: supple no meningeal signs SPINE/BACK:entire spine nontender CV: S1/S2 noted, no  murmurs/rubs/gallops noted LUNGS: No added lung sounds, but difficult to hear due to coughing ABDOMEN: soft, nontender, no rebound or guarding, bowel sounds noted throughout abdomen GU:no cva tenderness NEURO: Pt is awake/alert/appropriate, moves all extremitiesx4.  No facial droop.   EXTREMITIES: pulses normal/equal, full ROM, pitting edema to lower extremities SKIN: warm, color normal PSYCH: Anxious  ED Results / Procedures / Treatments   Labs (all labs ordered are listed, but only abnormal results are displayed) Labs Reviewed  RESP PANEL BY RT-PCR (FLU A&B, COVID) ARPGX2 - Abnormal; Notable for the following components:      Result Value   Influenza A by PCR POSITIVE (*)    All other components within normal limits  BASIC METABOLIC PANEL - Abnormal; Notable for the following components:   Sodium 133 (*)    Potassium 3.2 (*)    Chloride 93 (*)    Glucose, Bld 236 (*)    Creatinine, Ser 1.27 (*)    Calcium 8.1 (*)    GFR, Estimated 53 (*)    All other components within normal limits  BRAIN NATRIURETIC PEPTIDE - Abnormal; Notable for the following components:   B Natriuretic Peptide 3,929.0 (*)    All other components within normal limits  CBC WITH DIFFERENTIAL/PLATELET - Abnormal; Notable for the following components:   Hemoglobin 11.5 (*)    MCV 74.0 (*)    MCH 23.1 (*)    RDW 16.9 (*)    All other components within normal limits    EKG EKG Interpretation  Date/Time:  Friday April 03 2021 00:54:58 EST Ventricular Rate:  94 PR Interval:  166 QRS Duration: 105 QT Interval:  406 QTC Calculation: 508 R Axis:   1 Text Interpretation: Sinus rhythm Abnormal R-wave progression, late transition Probable left ventricular hypertrophy Nonspecific T abnormalities, lateral leads Borderline prolonged QT interval No significant change since last tracing Confirmed  by Ripley Fraise [phone]) on 04/03/2021 1:03:24 AM  Radiology DG Chest Port 1 View  Result Date:  04/03/2021 CLINICAL DATA:  Cough. EXAM: PORTABLE CHEST 1 VIEW COMPARISON:  Chest radiograph dated 03/27/2021. FINDINGS: Cardiomegaly with mild central vascular congestion. No focal consolidation, pleural effusion, or pneumothorax. No acute osseous pathology. IMPRESSION: Cardiomegaly with mild central vascular congestion. No focal consolidation. Electronically Signed   By: Anner Crete M.D.   On: 04/03/2021 01:37    Procedures Procedures   Medications Ordered in ED Medications  oseltamivir (TAMIFLU) capsule 75 mg (has no administration in time range)  hydrALAZINE (APRESOLINE) tablet 100 mg (has no administration in time range)  albuterol (PROVENTIL) (2.5 MG/3ML) 0.083% nebulizer solution 5 mg (5 mg Nebulization Given 04/03/21 0029)  benzonatate (TESSALON) capsule 100 mg (100 mg Oral Given 04/03/21 0046)  nitroGLYCERIN (NITROGLYN) 2 % ointment 1 inch (1 inch Topical Given 04/03/21 0153)  furosemide (LASIX) injection 40 mg (40 mg Intravenous Given 04/03/21 0200)    ED Course  I have reviewed the triage vital signs and the nursing notes.  Pertinent labs & imaging results that were available during my care of the patient were reviewed by me and considered in my medical decision making (see chart for details).    MDM Rules/Calculators/A&P                           Patient reports symptoms for up to a week.  It appears patient has influenza as well as likely acute on chronic CHF. She has been given nitroglycerin and Lasix, with minimal urine output at this time.  We will continue to monitor 4:41 AM Patient continues to have coughing and mild shortness of breath.  She does have brief episodes of hypoxia that corrects on room air.  Patient still has significant hypertension.  I suspect she has influenza that is triggered and mild CHF exacerbation.  May also be due to noncompliance because she admitted to not taking all of her medicine Patient will be admitted for blood pressure control,  diuresis.  Discussed with Dr. Josephine Cables for admission.  He request to start Tamiflu as well for flulike illness Patient also noted to have hyperglycemia without anion gap Final Clinical Impression(s) / ED Diagnoses Final diagnoses:  Influenza  Acute on chronic systolic congestive heart failure [facility])    Rx / DC Orders ED Discharge Orders     None        Ripley Fraise, MD 04/03/21 [phone]

## 2021-04-03 NOTE — TOC Progression Note (Signed)
Transition of Care [facility]) - Progression Note    Patient Details  Name: Chelsey Santiago MRN: [id number] Date of Birth: [birth year]  Transition of Care [facility]) CM/SW Contact  Boneta Lucks, RN Phone Number: 04/03/2021, 10:46 AM  Clinical Narrative:   Patient admitted for Influenza. TOC consulted for CHF. Patient is here for FLU, may have some extra fluid. TOC has completed multiple CHF consults. TOC to follow for any discharge needs.    Expected Discharge Plan: Home/Self Care Barriers to Discharge: Continued Medical Work up  Expected Discharge Plan and Services Expected Discharge Plan: Home/Self Care      Readmission Risk Interventions Readmission Risk Prevention Plan 04/03/2021 10/13/2020 10/08/2020  Transportation Screening Complete - Complete  HRI or Home Care Consult - - -  Social Work Consult for [place] Planning/Counseling - - -  Palliative Care Screening - - -  Medication Review Press photographer) Complete - Complete  PCP or Specialist appointment within 3-5 days of discharge - Not Complete -  [place] or Home Care Consult Complete - Complete  SW Recovery Care/Counseling Consult - - Complete  [place] - - Not Applicable  Some recent data might be hidden

## 2021-04-03 NOTE — Progress Notes (Signed)
Patient has declined CPAP at this time and RT has set up 2lpm [place] for sleep. RT will have a unit on stand by if needed.

## 2021-04-03 NOTE — Plan of Care (Signed)

## 2021-04-04 ENCOUNTER — Inpatient Hospital Stay (HOSPITAL_COMMUNITY): Payer: Medicaid Other

## 2021-04-04 DIAGNOSIS — I5021 Acute systolic (congestive) heart failure: Secondary | ICD-10-CM

## 2021-04-04 DIAGNOSIS — I5043 Acute on chronic combined systolic (congestive) and diastolic (congestive) heart failure: Secondary | ICD-10-CM | POA: Diagnosis not present

## 2021-04-04 DIAGNOSIS — E1165 Type 2 diabetes mellitus with hyperglycemia: Secondary | ICD-10-CM | POA: Diagnosis not present

## 2021-04-04 DIAGNOSIS — I160 Hypertensive urgency: Secondary | ICD-10-CM | POA: Diagnosis not present

## 2021-04-04 DIAGNOSIS — J101 Influenza due to other identified influenza virus with other respiratory manifestations: Secondary | ICD-10-CM | POA: Diagnosis not present

## 2021-04-04 LAB — COMPREHENSIVE METABOLIC PANEL
ALT: 11 U/L (ref 0–44)
AST: 22 U/L (ref 15–41)
Albumin: 2.9 g/dL — ABNORMAL LOW (ref 3.5–5.0)
Alkaline Phosphatase: 65 U/L (ref 38–126)
Anion gap: 10 (ref 5–15)
BUN: 16 mg/dL (ref 6–20)
CO2: 31 mmol/L (ref 22–32)
Calcium: 7.6 mg/dL — ABNORMAL LOW (ref 8.9–10.3)
Chloride: 93 mmol/L — ABNORMAL LOW (ref 98–111)
Creatinine, Ser: 0.89 mg/dL (ref 0.44–1.00)
GFR, Estimated: >60 mL/min (ref >60)
Glucose, Bld: 142 mg/dL — ABNORMAL HIGH (ref 70–99)
Potassium: 2.7 mmol/L — CL (ref 3.5–5.1)
Sodium: 134 mmol/L — ABNORMAL LOW (ref 135–145)
Total Bilirubin: 0.8 mg/dL (ref 0.3–1.2)
Total Protein: 6.8 g/dL (ref 6.5–8.1)

## 2021-04-04 LAB — ECHOCARDIOGRAM COMPLETE
AR max vel: 1.83 cm2
AV Area VTI: 2.02 cm2
AV Area mean vel: 2.01 cm2
AV Mean grad: 10 mmHg
AV Peak grad: 16.7 mmHg
Ao pk vel: 2.05 m/s
Area-P 1/2: 3.46 cm2
Calc EF: 36.8 %
Height: 66 in
S' Lateral: 4.3 cm
Single Plane A2C EF: 30 %
Single Plane A4C EF: 42.9 %
Weight: 3599.99 oz

## 2021-04-04 LAB — PROTIME-INR
INR: 1.2 (ref 0.8–1.2)
Prothrombin Time: 14.7 seconds (ref 11.4–15.2)

## 2021-04-04 LAB — GLUCOSE, CAPILLARY
Glucose-Capillary: 133 mg/dL — ABNORMAL HIGH (ref 70–99)
Glucose-Capillary: 168 mg/dL — ABNORMAL HIGH (ref 70–99)
Glucose-Capillary: 193 mg/dL — ABNORMAL HIGH (ref 70–99)
Glucose-Capillary: 146 mg/dL — ABNORMAL HIGH (ref 70–99)

## 2021-04-04 LAB — MAGNESIUM: Magnesium: 1.4 mg/dL — ABNORMAL LOW (ref 1.7–2.4)

## 2021-04-04 LAB — CBC
HCT: 38 % (ref 36.0–46.0)
Hemoglobin: 11.8 g/dL — ABNORMAL LOW (ref 12.0–15.0)
MCH: 22.8 pg — ABNORMAL LOW (ref 26.0–34.0)
MCHC: 31.1 g/dL (ref 30.0–36.0)
MCV: 73.4 fL — ABNORMAL LOW (ref 80.0–100.0)
Platelets: 228 10*3/uL (ref 150–400)
RBC: 5.18 MIL/uL — ABNORMAL HIGH (ref 3.87–5.11)
RDW: 17.2 % — ABNORMAL HIGH (ref 11.5–15.5)
WBC: 5.4 10*3/uL (ref 4.0–10.5)
nRBC: 0 % (ref 0.0–0.2)

## 2021-04-04 LAB — APTT: aPTT: 28 seconds (ref 24–36)

## 2021-04-04 MED ORDER — BENZONATATE 100 MG PO CAPS
200.0000 mg | ORAL_CAPSULE | Freq: Three times a day (TID) | ORAL | Status: DC
  Administered 2021-04-04 – 2021-04-09 (×14): 200 mg via ORAL
  Filled 2021-04-04 (×14): qty 2

## 2021-04-04 MED ORDER — BUDESONIDE 0.25 MG/2ML IN SUSP
0.2500 mg | Freq: Two times a day (BID) | RESPIRATORY_TRACT | Status: DC
  Administered 2021-04-04 – 2021-04-08 (×8): 0.25 mg via RESPIRATORY_TRACT
  Filled 2021-04-04 (×8): qty 2

## 2021-04-04 MED ORDER — MAGNESIUM SULFATE 4 GM/100ML IV SOLN
4.0000 g | Freq: Once | INTRAVENOUS | Status: AC
  Administered 2021-04-04: 4 g via INTRAVENOUS
  Filled 2021-04-04: qty 100

## 2021-04-04 MED ORDER — GABAPENTIN 400 MG PO CAPS
800.0000 mg | ORAL_CAPSULE | Freq: Three times a day (TID) | ORAL | Status: DC
  Administered 2021-04-04 – 2021-04-09 (×14): 800 mg via ORAL
  Filled 2021-04-04: qty 2
  Filled 2021-04-04: qty 8
  Filled 2021-04-04 (×11): qty 2

## 2021-04-04 MED ORDER — POTASSIUM CHLORIDE 10 MEQ/100ML IV SOLN
10.0000 meq | INTRAVENOUS | Status: AC
  Administered 2021-04-04 (×4): 10 meq via INTRAVENOUS
  Filled 2021-04-04 (×3): qty 100

## 2021-04-04 MED ORDER — TORSEMIDE 20 MG PO TABS
40.0000 mg | ORAL_TABLET | Freq: Two times a day (BID) | ORAL | Status: DC
Start: 2021-04-04 — End: 2021-04-06
  Administered 2021-04-04 – 2021-04-06 (×4): 40 mg via ORAL
  Filled 2021-04-04 (×5): qty 2

## 2021-04-04 MED ORDER — IPRATROPIUM-ALBUTEROL 0.5-2.5 (3) MG/3ML IN SOLN
3.0000 mL | Freq: Four times a day (QID) | RESPIRATORY_TRACT | Status: DC
  Administered 2021-04-04 – 2021-04-07 (×13): 3 mL via RESPIRATORY_TRACT
  Filled 2021-04-04 (×13): qty 3

## 2021-04-04 MED ORDER — POTASSIUM CHLORIDE CRYS ER 20 MEQ PO TBCR
40.0000 meq | EXTENDED_RELEASE_TABLET | Freq: Once | ORAL | Status: AC
  Administered 2021-04-04: 40 meq via ORAL
  Filled 2021-04-04: qty 2

## 2021-04-04 MED ORDER — DAPAGLIFLOZIN PROPANEDIOL 5 MG PO TABS
10.0000 mg | ORAL_TABLET | Freq: Every day | ORAL | Status: DC
  Administered 2021-04-04 – 2021-04-09 (×6): 10 mg via ORAL
  Filled 2021-04-04: qty 1
  Filled 2021-04-04: qty 2
  Filled 2021-04-04 (×2): qty 1
  Filled 2021-04-04: qty 2
  Filled 2021-04-04 (×3): qty 1

## 2021-04-04 NOTE — Progress Notes (Signed)
PROGRESS NOTE  Chelsey Santiago HWE:[id number] DOB: November 28, 1975 DOA: 04/02/2021 PCP: Rosita Fire, MD  Brief History:  45 year old female with a history of systolic and diastolic CHF, hypertensive cardiomyopathy, diabetes mellitus type 2, anxiety, OSA, iron deficiency anemia presenting with 1 week history of shortness of breath, myalgias, generalized weakness, and nonproductive cough.  She has had some subjective fevers and chills.  She also had some nausea and vomiting on the day of admission without any hematemesis, hematochezia, melena.  She describes some intermittent chest discomfort and epigastric discomfort although she has not able to clarify the duration, intensity, or variable factors. Notably, the patient recently saw cardiology on 02/20/2021.  It was noted that the patient had a 10 pound weight gain since 02/02/2021.  The patient was started on Farxiga at that time.  She was instructed to continue torsemide 40 twice daily and weigh herself daily.  The patient has not been weighing herself. In the emergency department, the patient had low-grade temperature 99.4 F.  She was mildly tachycardic with blood pressure up to 201/130.  Chest x-ray showed increasing vascular congestion.  BMP showed sodium 133, potassium 3.2, CO2 28, serum creatinine 1.27.  WBC 8.7, hemoglobin 11.5, platelets 252,000.  Influenza was positive.  Patient was started on oseltamavir and IV furosemide.  Assessment/Plan: Acute on chronic systolic and diastolic CHF -She does not appear to be massively fluid overloaded on exam -I feel that her interstitial markings on chest x-ray are more reflective of the patient's influenza with pulmonary manifestations -Patient given IV furosemide in the ED -Echocardiogram updated and shows that EF remains 30 to 35% -Continue carvedilol, Farxiga, spironolactone, Entresto -Restarted on home dose of torsemide since she appears to be euvolemic at this time  Influenza A with  pulmonary manifestations -Continue oseltamivir -Continue pulmonary hygiene  Hypertensive urgency -Continue hydralazine, Imdur, carvedilol, spironolactone, amlodipine, Imdur  Uncontrolled diabetes mellitus type 2 with hyperglycemia -Start reduced dose Semglee -NovoLog sliding scale -Hemoglobin A1c 9.5 -Blood sugars have been stable  Depression/anxiety -Restart Abilify, clonazepam  Peripheral neuropathy -Restart home dose of gabapentin  Atypical chest pain -Personally reviewed EKG--sinus rhythm, nonspecific ST-T wave change -Troponins have remained flat, unlikely ACS  Obesity class II -BMI 36.32 -Lifestyle modification  Hypokalemia/hypomagnesemia -Replace    Status is: Inpatient  Remains inpatient appropriate because: severity of illness requiring IV lasix        Family Communication:  no Family at bedside  Consultants:  none  Code Status:  FULL   DVT Prophylaxis:  [place] Lovenox   Procedures: As Listed in Progress Note Above  Antibiotics: None      Subjective: Continues to have nonproductive cough.  Overall she feels mildly better today than she did yesterday.  Objective: Vitals:   04/04/21 0823 04/04/21 1202 04/04/21 1354 04/04/21 1654  BP: (!) 177/108  116/79 (!) 133/91  Pulse: 79  80 80  Resp:   18   Temp:   99.2 F (37.3 C)   TempSrc:   Oral   SpO2:  91% 94%   Weight:      Height:        Intake/Output Summary (Last 24 hours) at 04/04/2021 2012 Last data filed at 04/04/2021 1500 Gross per 24 hour  Intake 583.32 ml  Output --  Net 583.32 ml   Weight change: -0 kg Exam:  General:  Pt is alert, follows commands appropriately, not in acute distress HEENT: No icterus, No thrush, No neck mass, [place]/AT  Cardiovascular: RRR, S1/S2, no rubs, no gallops Respiratory: Fine bibasilar crackles.  No wheezing Abdomen: Soft/+BS, non tender, non distended, no guarding Extremities: trace LE edema, No lymphangitis, No petechiae, No rashes, no  synovitis   Data Reviewed: I have personally reviewed following labs and imaging studies Basic Metabolic Panel: Recent Labs  Lab 04/03/21 0051 04/03/21 1907 04/04/21 0631  NA 133*  --  134*  K 3.2*  --  2.7*  CL 93*  --  93*  CO2 28  --  31  GLUCOSE 236*  --  142*  BUN 15  --  16  CREATININE 1.27*  --  0.89  CALCIUM 8.1*  --  7.6*  MG  --  1.4* 1.4*  PHOS  --  2.9  --    Liver Function Tests: Recent Labs  Lab 04/04/21 0631  AST 22  ALT 11  ALKPHOS 65  BILITOT 0.8  PROT 6.8  ALBUMIN 2.9*   No results for input(s): LIPASE, AMYLASE in the last 168 hours. No results for input(s): AMMONIA in the last 168 hours. Coagulation Profile: Recent Labs  Lab 04/04/21 0631  INR 1.2   CBC: Recent Labs  Lab 04/03/21 0051 04/04/21 0631  WBC 8.7 5.4  NEUTROABS 6.3  --   HGB 11.5* 11.8*  HCT 36.8 38.0  MCV 74.0* 73.4*  PLT 252 228   Cardiac Enzymes: No results for input(s): CKTOTAL, CKMB, CKMBINDEX, TROPONINI in the last 168 hours. BNP: Invalid input(s): POCBNP CBG: Recent Labs  Lab 04/03/21 1634 04/03/21 2042 04/04/21 0721 04/04/21 1110 04/04/21 1700  GLUCAP 175* 186* 133* 168* 193*   HbA1C: Recent Labs    04/03/21 0051  HGBA1C 9.5*   Urine analysis:    Component Value Date/Time   COLORURINE YELLOW 08/28/2019 1226   APPEARANCEUR CLEAR 08/28/2019 1226   LABSPEC 1.028 08/28/2019 1226   PHURINE 6.0 08/28/2019 1226   GLUCOSEU >=500 (A) 08/28/2019 1226   HGBUR NEGATIVE 08/28/2019 1226   BILIRUBINUR NEGATIVE 08/28/2019 1226   KETONESUR NEGATIVE 08/28/2019 1226   PROTEINUR Negative 12/19/2019 1010   PROTEINUR NEGATIVE 08/28/2019 1226   UROBILINOGEN 0.2 04/29/2014 1115   NITRITE n 12/19/2019 1010   NITRITE NEGATIVE 08/28/2019 1226   LEUKOCYTESUR Negative 12/19/2019 1010   LEUKOCYTESUR TRACE (A) 08/28/2019 1226   Sepsis Labs: @LABRCNTIP (procalcitonin:4,lacticidven:4) ) Recent Results (from the past 240 hour(s))  Resp Panel by RT-PCR (Flu A&B, Covid)  Nasopharyngeal Swab     Status: None   Collection Time: 03/27/21  8:05 AM   Specimen: Nasopharyngeal Swab; Nasopharyngeal(NP) swabs in vial transport medium  Result Value Ref Range Status   SARS Coronavirus 2 by RT PCR NEGATIVE NEGATIVE Final    Comment: (NOTE) SARS-CoV-2 target nucleic acids are NOT DETECTED.  The SARS-CoV-2 RNA is generally detectable in upper respiratory specimens during the acute phase of infection. The lowest concentration of SARS-CoV-2 viral copies this assay can detect is 138 copies/mL. A negative result does not preclude SARS-Cov-2 infection and should not be used as the sole basis for treatment or other patient management decisions. A negative result may occur with  improper specimen collection/handling, submission of specimen other than nasopharyngeal swab, presence of viral mutation(s) within the areas targeted by this assay, and inadequate number of viral copies(<138 copies/mL). A negative result must be combined with clinical observations, patient history, and epidemiological information. The expected result is Negative.  Fact Sheet for Patients:  EntrepreneurPulse.com.au  Fact Sheet for Healthcare Providers:  IncredibleEmployment.be  This test is no t yet  approved or cleared by the [place] and  has been authorized for detection and/or diagnosis of SARS-CoV-2 by FDA under an Emergency Use Authorization (EUA). This EUA will remain  in effect (meaning this test can be used) for the duration of the COVID-19 declaration under Section 564(b)(1) of the Act, 21 U.S.C.section 360bbb-3(b)(1), unless the authorization is terminated  or revoked sooner.       Influenza A by PCR NEGATIVE NEGATIVE Final   Influenza B by PCR NEGATIVE NEGATIVE Final    Comment: (NOTE) The Xpert Xpress SARS-CoV-2/FLU/RSV plus assay is intended as an aid in the diagnosis of influenza from Nasopharyngeal swab specimens and should not be  used as a sole basis for treatment. Nasal washings and aspirates are unacceptable for Xpert Xpress SARS-CoV-2/FLU/RSV testing.  Fact Sheet for Patients: EntrepreneurPulse.com.au  Fact Sheet for Healthcare Providers: IncredibleEmployment.be  This test is not yet approved or cleared by the [place] FDA and has been authorized for detection and/or diagnosis of SARS-CoV-2 by FDA under an Emergency Use Authorization (EUA). This EUA will remain in effect (meaning this test can be used) for the duration of the COVID-19 declaration under Section 564(b)(1) of the Act, 21 U.S.C. section 360bbb-3(b)(1), unless the authorization is terminated or revoked.  Performed at [facility], [address] [place], [place] [address]   Resp Panel by RT-PCR (Flu A&B, Covid) Nasopharyngeal Swab     Status: Abnormal   Collection Time: 04/03/21 12:45 AM   Specimen: Nasopharyngeal Swab; Nasopharyngeal(NP) swabs in vial transport medium  Result Value Ref Range Status   SARS Coronavirus 2 by RT PCR NEGATIVE NEGATIVE Final    Comment: (NOTE) SARS-CoV-2 target nucleic acids are NOT DETECTED.  The SARS-CoV-2 RNA is generally detectable in upper respiratory specimens during the acute phase of infection. The lowest concentration of SARS-CoV-2 viral copies this assay can detect is 138 copies/mL. A negative result does not preclude SARS-Cov-2 infection and should not be used as the sole basis for treatment or other patient management decisions. A negative result may occur with  improper specimen collection/handling, submission of specimen other than nasopharyngeal swab, presence of viral mutation(s) within the areas targeted by this assay, and inadequate number of viral copies(<138 copies/mL). A negative result must be combined with clinical observations, patient history, and epidemiological information. The expected result is Negative.  Fact Sheet for Patients:   EntrepreneurPulse.com.au  Fact Sheet for Healthcare Providers:  IncredibleEmployment.be  This test is no t yet approved or cleared by the [place] FDA and  has been authorized for detection and/or diagnosis of SARS-CoV-2 by FDA under an Emergency Use Authorization (EUA). This EUA will remain  in effect (meaning this test can be used) for the duration of the COVID-19 declaration under Section 564(b)(1) of the Act, 21 U.S.C.section 360bbb-3(b)(1), unless the authorization is terminated  or revoked sooner.       Influenza A by PCR POSITIVE (A) NEGATIVE Final   Influenza B by PCR NEGATIVE NEGATIVE Final    Comment: (NOTE) The Xpert Xpress SARS-CoV-2/FLU/RSV plus assay is intended as an aid in the diagnosis of influenza from Nasopharyngeal swab specimens and should not be used as a sole basis for treatment. Nasal washings and aspirates are unacceptable for Xpert Xpress SARS-CoV-2/FLU/RSV testing.  Fact Sheet for Patients: EntrepreneurPulse.com.au  Fact Sheet for Healthcare Providers: IncredibleEmployment.be  This test is not yet approved or cleared by the [place] FDA and has been authorized for detection and/or diagnosis of SARS-CoV-2 by FDA under an  Emergency Use Authorization (EUA). This EUA will remain in effect (meaning this test can be used) for the duration of the COVID-19 declaration under Section 564(b)(1) of the Act, 21 U.S.C. section 360bbb-3(b)(1), unless the authorization is terminated or revoked.  Performed at [facility], [address] [place], [place] [address]      Scheduled Meds:  amLODipine  10 mg Oral Daily   aspirin EC  81 mg Oral Daily   benzonatate  200 mg Oral TID   budesonide (PULMICORT) nebulizer solution  0.25 mg Nebulization BID   carvedilol  25 mg Oral BID WC   clonazePAM  0.5 mg Oral BID   dapagliflozin propanediol  10 mg Oral QAC breakfast    dextromethorphan-guaiFENesin  1 tablet Oral BID   enoxaparin (LOVENOX) injection  40 mg Subcutaneous Q24H   gabapentin  800 mg Oral TID   hydrALAZINE  100 mg Oral TID   insulin aspart  0-15 Units Subcutaneous TID WC   insulin aspart  0-5 Units Subcutaneous QHS   insulin glargine-yfgn  10 Units Subcutaneous QHS   ipratropium-albuterol  3 mL Nebulization Q6H   isosorbide mononitrate  30 mg Oral Daily   oseltamivir  75 mg Oral BID   sacubitril-valsartan  1 tablet Oral BID   sodium chloride flush  3 mL Intravenous Q12H   spironolactone  25 mg Oral Daily   torsemide  40 mg Oral BID   Continuous Infusions:  sodium chloride     sodium chloride Stopped (04/03/21 1116)    Procedures/Studies: DG Chest Port 1 View  Result Date: 04/03/2021 CLINICAL DATA:  Cough. EXAM: PORTABLE CHEST 1 VIEW COMPARISON:  Chest radiograph dated 03/27/2021. FINDINGS: Cardiomegaly with mild central vascular congestion. No focal consolidation, pleural effusion, or pneumothorax. No acute osseous pathology. IMPRESSION: Cardiomegaly with mild central vascular congestion. No focal consolidation. Electronically Signed   By: Anner Crete M.D.   On: 04/03/2021 01:37   DG Chest Portable 1 View  Result Date: 03/27/2021 CLINICAL DATA:  Shortness of breath, cough EXAM: PORTABLE CHEST 1 VIEW COMPARISON:  Previous studies including the examination of 02/02/2021 FINDINGS: Transverse diameter of heart is increased. There are no signs of pulmonary edema or focal pulmonary consolidation. There is no pleural effusion or pneumothorax. IMPRESSION: Cardiomegaly. There are no signs of pulmonary edema or focal pulmonary consolidation. Electronically Signed   By: Elmer Picker M.D.   On: 03/27/2021 08:09   ECHOCARDIOGRAM COMPLETE  Result Date: 04/04/2021    ECHOCARDIOGRAM REPORT   Patient Name:   LEEYA RUSCONI Date of Exam: 04/04/2021 Medical Rec #:  [id number]       Height:       66.0 in Accession #:    [id number]      Weight:        225.0 lb Date of Birth:  [birth year]       BSA:          2.102 m Patient Age:    39 years        BP:           177/108 mmHg Patient Gender: F               HR:           76 bpm. Exam Location:  Inpatient Procedure: 2D Echo, Color Doppler and Cardiac Doppler Indications:    C14.48 Acute systolic (congestive) heart failure  History:        Patient has prior history of Echocardiogram examinations, most  recent 03/28/2020. CHF; Risk Factors:Hypertension and Sleep                 Apnea.  Sonographer:    Raquel Sarna Senior RDCS Referring Phys: [phone] DAVID TAT IMPRESSIONS  1. Left ventricular ejection fraction, by estimation, is 30 to 35%. The left ventricle has moderately decreased function. The left ventricle demonstrates global hypokinesis. There is moderate left ventricular hypertrophy. Left ventricular diastolic parameters are consistent with Grade II diastolic dysfunction (pseudonormalization). Elevated left atrial pressure.  2. Right ventricular systolic function is normal. The right ventricular size is mildly enlarged.  3. Left atrial size was mildly dilated.  4. Right atrial size was mildly dilated.  5. The mitral valve is normal in structure. No evidence of mitral valve regurgitation. No evidence of mitral stenosis.  6. The aortic valve has an indeterminant number of cusps. Aortic valve regurgitation is not visualized. Mild aortic valve stenosis.  7. The inferior vena cava is normal in size with greater than 50% respiratory variability, suggesting right atrial pressure of 3 mmHg. FINDINGS  Left Ventricle: Left ventricular ejection fraction, by estimation, is 30 to 35%. The left ventricle has moderately decreased function. The left ventricle demonstrates global hypokinesis. The left ventricular internal cavity size was normal in size. There is moderate left ventricular hypertrophy. Left ventricular diastolic parameters are consistent with Grade II diastolic dysfunction (pseudonormalization). Elevated  left atrial pressure. Right Ventricle: The right ventricular size is mildly enlarged. Right ventricular systolic function is normal. Left Atrium: Left atrial size was mildly dilated. Right Atrium: Right atrial size was mildly dilated. Pericardium: There is no evidence of pericardial effusion. Mitral Valve: The mitral valve is normal in structure. No evidence of mitral valve regurgitation. No evidence of mitral valve stenosis. Tricuspid Valve: The tricuspid valve is normal in structure. Tricuspid valve regurgitation is trivial. No evidence of tricuspid stenosis. Aortic Valve: The aortic valve has an indeterminant number of cusps. Aortic valve regurgitation is not visualized. Mild aortic stenosis is present. Aortic valve mean gradient measures 10.0 mmHg. Aortic valve peak gradient measures 16.7 mmHg. Aortic valve  area, by VTI measures 2.02 cm. Pulmonic Valve: The pulmonic valve was normal in structure. Pulmonic valve regurgitation is not visualized. No evidence of pulmonic stenosis. Aorta: The aortic root is normal in size and structure. Venous: The inferior vena cava is normal in size with greater than 50% respiratory variability, suggesting right atrial pressure of 3 mmHg. IAS/Shunts: No atrial level shunt detected by color flow Doppler.  LEFT VENTRICLE PLAX 2D LVIDd:         5.30 cm      Diastology LVIDs:         4.30 cm      LV e' medial:    4.13 cm/s LV PW:         1.50 cm      LV E/e' medial:  21.2 LV IVS:        1.10 cm      LV e' lateral:   3.48 cm/s LVOT diam:     2.10 cm      LV E/e' lateral: 25.1 LV SV:         65 LV SV Index:   31 LVOT Area:     3.46 cm  LV Volumes (MOD) LV vol d, MOD A2C: 104.0 ml LV vol d, MOD A4C: 112.0 ml LV vol s, MOD A2C: 72.8 ml LV vol s, MOD A4C: 64.0 ml LV SV MOD A2C:     31.2  ml LV SV MOD A4C:     112.0 ml LV SV MOD BP:      39.8 ml RIGHT VENTRICLE RV S prime:     8.92 cm/s TAPSE (M-mode): 2.3 cm LEFT ATRIUM             Index        RIGHT ATRIUM           Index LA diam:         4.40 cm 2.09 cm/m   RA Area:     22.30 cm LA Vol (A2C):   48.6 ml 23.12 ml/m  RA Volume:   68.30 ml  32.49 ml/m LA Vol (A4C):   76.3 ml 36.29 ml/m LA Biplane Vol: 62.5 ml 29.73 ml/m  AORTIC VALVE AV Area (Vmax):    1.83 cm AV Area (Vmean):   2.01 cm AV Area (VTI):     2.02 cm AV Vmax:           204.50 cm/s AV Vmean:          142.500 cm/s AV VTI:            0.324 m AV Peak Grad:      16.7 mmHg AV Mean Grad:      10.0 mmHg LVOT Vmax:         108.00 cm/s LVOT Vmean:        82.600 cm/s LVOT VTI:          0.189 m LVOT/AV VTI ratio: 0.58  AORTA Ao Root diam: 2.80 cm Ao Asc diam:  3.30 cm MITRAL VALVE MV Area (PHT): 3.46 cm    SHUNTS MV Decel Time: 219 msec    Systemic VTI:  0.19 m MV E velocity: 87.40 cm/s  Systemic Diam: 2.10 cm MV A velocity: 57.80 cm/s MV E/A ratio:  1.51 Kirk Ruths MD Electronically signed by Kirk Ruths MD Signature Date/Time: 04/04/2021/2:36:04 PM    Final     Kathie Dike, MD  [facility]  If 7PM-7AM, please contact night-coverage [email or web address]  04/04/2021, 8:12 PM   LOS: 1 day

## 2021-04-05 DIAGNOSIS — I5043 Acute on chronic combined systolic (congestive) and diastolic (congestive) heart failure: Secondary | ICD-10-CM | POA: Diagnosis not present

## 2021-04-05 DIAGNOSIS — E1165 Type 2 diabetes mellitus with hyperglycemia: Secondary | ICD-10-CM | POA: Diagnosis not present

## 2021-04-05 DIAGNOSIS — I160 Hypertensive urgency: Secondary | ICD-10-CM | POA: Diagnosis not present

## 2021-04-05 DIAGNOSIS — J101 Influenza due to other identified influenza virus with other respiratory manifestations: Secondary | ICD-10-CM | POA: Diagnosis not present

## 2021-04-05 LAB — BASIC METABOLIC PANEL
Anion gap: 11 (ref 5–15)
BUN: 14 mg/dL (ref 6–20)
CO2: 30 mmol/L (ref 22–32)
Calcium: 8.3 mg/dL — ABNORMAL LOW (ref 8.9–10.3)
Chloride: 97 mmol/L — ABNORMAL LOW (ref 98–111)
Creatinine, Ser: 1.02 mg/dL — ABNORMAL HIGH (ref 0.44–1.00)
GFR, Estimated: >60 mL/min (ref >60)
Glucose, Bld: 208 mg/dL — ABNORMAL HIGH (ref 70–99)
Potassium: 3.9 mmol/L (ref 3.5–5.1)
Sodium: 138 mmol/L (ref 135–145)

## 2021-04-05 LAB — GLUCOSE, CAPILLARY
Glucose-Capillary: 292 mg/dL — ABNORMAL HIGH (ref 70–99)
Glucose-Capillary: 145 mg/dL — ABNORMAL HIGH (ref 70–99)
Glucose-Capillary: 373 mg/dL — ABNORMAL HIGH (ref 70–99)
Glucose-Capillary: 345 mg/dL — ABNORMAL HIGH (ref 70–99)

## 2021-04-05 LAB — MAGNESIUM: Magnesium: 2.3 mg/dL (ref 1.7–2.4)

## 2021-04-05 MED ORDER — METHYLPREDNISOLONE SODIUM SUCC 40 MG IJ SOLR
40.0000 mg | Freq: Two times a day (BID) | INTRAMUSCULAR | Status: DC
  Administered 2021-04-05 – 2021-04-06 (×2): 40 mg via INTRAVENOUS
  Filled 2021-04-05 (×2): qty 1

## 2021-04-05 MED ORDER — INSULIN GLARGINE-YFGN 100 UNIT/ML ~~LOC~~ SOLN
30.0000 [IU] | Freq: Every day | SUBCUTANEOUS | Status: DC
  Administered 2021-04-05: 30 [IU] via SUBCUTANEOUS
  Filled 2021-04-05 (×2): qty 0.3

## 2021-04-05 MED ORDER — INSULIN ASPART 100 UNIT/ML IJ SOLN
5.0000 [IU] | Freq: Three times a day (TID) | INTRAMUSCULAR | Status: DC
  Administered 2021-04-06 – 2021-04-09 (×10): 5 [IU] via SUBCUTANEOUS

## 2021-04-05 MED ORDER — TRAZODONE HCL 50 MG PO TABS
50.0000 mg | ORAL_TABLET | Freq: Every evening | ORAL | Status: DC | PRN
  Administered 2021-04-05 – 2021-04-08 (×3): 50 mg via ORAL
  Filled 2021-04-05 (×3): qty 1

## 2021-04-05 MED ORDER — TRAZODONE HCL 50 MG PO TABS
50.0000 mg | ORAL_TABLET | Freq: Once | ORAL | Status: AC
  Administered 2021-04-05: 50 mg via ORAL
  Filled 2021-04-05: qty 1

## 2021-04-05 NOTE — Progress Notes (Signed)
PROGRESS NOTE  Chelsey Santiago HOZ:[id number] DOB: April 06, 1976 DOA: 04/02/2021 PCP: Rosita Fire, MD  Brief History:  45 year old female with a history of systolic and diastolic CHF, hypertensive cardiomyopathy, diabetes mellitus type 2, anxiety, OSA, iron deficiency anemia presenting with 1 week history of shortness of breath, myalgias, generalized weakness, and nonproductive cough.  She has had some subjective fevers and chills.  She also had some nausea and vomiting on the day of admission without any hematemesis, hematochezia, melena.  She describes some intermittent chest discomfort and epigastric discomfort although she has not able to clarify the duration, intensity, or variable factors. Notably, the patient recently saw cardiology on 02/20/2021.  It was noted that the patient had a 10 pound weight gain since 02/02/2021.  The patient was started on Farxiga at that time.  She was instructed to continue torsemide 40 twice daily and weigh herself daily.  The patient has not been weighing herself. In the emergency department, the patient had low-grade temperature 99.4 F.  She was mildly tachycardic with blood pressure up to 201/130.  Chest x-ray showed increasing vascular congestion.  BMP showed sodium 133, potassium 3.2, CO2 28, serum creatinine 1.27.  WBC 8.7, hemoglobin 11.5, platelets 252,000.  Influenza was positive.  Patient was started on oseltamavir and IV furosemide.  Assessment/Plan: Acute on chronic systolic and diastolic CHF -She does not appear to be massively fluid overloaded on exam -I feel that her interstitial markings on chest x-ray are more reflective of the patient's influenza with pulmonary manifestations -Patient given IV furosemide in the ED -Echocardiogram updated and shows that EF remains 30 to 35% -Continue carvedilol, Farxiga, spironolactone, Entresto -Restarted on home dose of torsemide since she appears to be euvolemic at this time  Influenza A with  pulmonary manifestations -Continue oseltamivir -Continue pulmonary hygiene - she is on bronchodilators and inhaled steroids, but continues to have wheezing -We will start a course of Solu-Medrol/prednisone  Hypertensive urgency -Continue hydralazine, Imdur, carvedilol, spironolactone, amlodipine, Imdur  Uncontrolled diabetes mellitus type 2 with hyperglycemia -Currently on basal insulin -NovoLog sliding scale -Hemoglobin A1c 9.5 -Blood sugars elevated -Increase basal insulin and add meal coverage NovoLog  Depression/anxiety -Restart Abilify, clonazepam  Peripheral neuropathy -Restart home dose of gabapentin  Atypical chest pain -Personally reviewed EKG--sinus rhythm, nonspecific ST-T wave change -Troponins have remained flat, unlikely ACS  Obesity class II -BMI 36.32 -Lifestyle modification  Hypokalemia/hypomagnesemia -Replaced    Status is: Inpatient  Remains inpatient appropriate because: severity of illness requiring IV lasix        Family Communication:  no Family at bedside  Consultants:  none  Code Status:  FULL   DVT Prophylaxis:  [place] Lovenox   Procedures: As Listed in Progress Note Above  Antibiotics: None      Subjective: Reports continued cough and still feels short of breath.  She has been wheezing.  Objective: Vitals:   04/05/21 0508 04/05/21 0835 04/05/21 1547 04/05/21 1721  BP: 131/83 (!) 142/93 112/69 124/80  Pulse: 79 84 81 84  Resp: 20  (!) 24   Temp: 98.8 F (37.1 C)  97.6 F (36.4 C)   TempSrc: Oral  Oral   SpO2: 94%  91%   Weight: 98.8 kg     Height: 5\' 6"  (1.676 m)       Intake/Output Summary (Last 24 hours) at 04/05/2021 1806 Last data filed at 04/05/2021 1200 Gross per 24 hour  Intake 600 ml  Output 2 ml  Net 598 ml   Weight change: -3.259 kg Exam:  General:  Pt is alert, follows commands appropriately, not in acute distress HEENT: No icterus, No thrush, No neck mass, Gem/AT Cardiovascular: RRR, S1/S2,  no rubs, no gallops Respiratory: Mild wheezing bilaterally Abdomen: Soft/+BS, non tender, non distended, no guarding Extremities: No edema bilaterally   Data Reviewed: I have personally reviewed following labs and imaging studies Basic Metabolic Panel: Recent Labs  Lab 04/03/21 0051 04/03/21 1907 04/04/21 0631 04/05/21 0511  NA 133*  --  134* 138  K 3.2*  --  2.7* 3.9  CL 93*  --  93* 97*  CO2 28  --  31 30  GLUCOSE 236*  --  142* 208*  BUN 15  --  16 14  CREATININE 1.27*  --  0.89 1.02*  CALCIUM 8.1*  --  7.6* 8.3*  MG  --  1.4* 1.4* 2.3  PHOS  --  2.9  --   --    Liver Function Tests: Recent Labs  Lab 04/04/21 0631  AST 22  ALT 11  ALKPHOS 65  BILITOT 0.8  PROT 6.8  ALBUMIN 2.9*   No results for input(s): LIPASE, AMYLASE in the last 168 hours. No results for input(s): AMMONIA in the last 168 hours. Coagulation Profile: Recent Labs  Lab 04/04/21 0631  INR 1.2   CBC: Recent Labs  Lab 04/03/21 0051 04/04/21 0631  WBC 8.7 5.4  NEUTROABS 6.3  --   HGB 11.5* 11.8*  HCT 36.8 38.0  MCV 74.0* 73.4*  PLT 252 228   Cardiac Enzymes: No results for input(s): CKTOTAL, CKMB, CKMBINDEX, TROPONINI in the last 168 hours. BNP: Invalid input(s): POCBNP CBG: Recent Labs  Lab 04/04/21 1700 04/04/21 2118 04/05/21 0736 04/05/21 1208 04/05/21 1723  GLUCAP 193* 146* 292* 145* 373*   HbA1C: Recent Labs    04/03/21 0051  HGBA1C 9.5*   Urine analysis:    Component Value Date/Time   COLORURINE YELLOW 08/28/2019 1226   APPEARANCEUR CLEAR 08/28/2019 1226   LABSPEC 1.028 08/28/2019 1226   PHURINE 6.0 08/28/2019 1226   GLUCOSEU >=500 (A) 08/28/2019 1226   HGBUR NEGATIVE 08/28/2019 1226   BILIRUBINUR NEGATIVE 08/28/2019 1226   KETONESUR NEGATIVE 08/28/2019 1226   PROTEINUR Negative 12/19/2019 1010   PROTEINUR NEGATIVE 08/28/2019 1226   UROBILINOGEN 0.2 04/29/2014 1115   NITRITE n 12/19/2019 1010   NITRITE NEGATIVE 08/28/2019 1226   LEUKOCYTESUR Negative  12/19/2019 1010   LEUKOCYTESUR TRACE (A) 08/28/2019 1226   Sepsis Labs: @LABRCNTIP (procalcitonin:4,lacticidven:4) ) Recent Results (from the past 240 hour(s))  Resp Panel by RT-PCR (Flu A&B, Covid) Nasopharyngeal Swab     Status: None   Collection Time: 03/27/21  8:05 AM   Specimen: Nasopharyngeal Swab; Nasopharyngeal(NP) swabs in vial transport medium  Result Value Ref Range Status   SARS Coronavirus 2 by RT PCR NEGATIVE NEGATIVE Final    Comment: (NOTE) SARS-CoV-2 target nucleic acids are NOT DETECTED.  The SARS-CoV-2 RNA is generally detectable in upper respiratory specimens during the acute phase of infection. The lowest concentration of SARS-CoV-2 viral copies this assay can detect is 138 copies/mL. A negative result does not preclude SARS-Cov-2 infection and should not be used as the sole basis for treatment or other patient management decisions. A negative result may occur with  improper specimen collection/handling, submission of specimen other than nasopharyngeal swab, presence of viral mutation(s) within the areas targeted by this assay, and inadequate number of viral copies(<138 copies/mL). A negative result must be combined  with clinical observations, patient history, and epidemiological information. The expected result is Negative.  Fact Sheet for Patients:  EntrepreneurPulse.com.au  Fact Sheet for Healthcare Providers:  IncredibleEmployment.be  This test is no t yet approved or cleared by the [place] FDA and  has been authorized for detection and/or diagnosis of SARS-CoV-2 by FDA under an Emergency Use Authorization (EUA). This EUA will remain  in effect (meaning this test can be used) for the duration of the COVID-19 declaration under Section 564(b)(1) of the Act, 21 U.S.C.section 360bbb-3(b)(1), unless the authorization is terminated  or revoked sooner.       Influenza A by PCR NEGATIVE NEGATIVE Final   Influenza B  by PCR NEGATIVE NEGATIVE Final    Comment: (NOTE) The Xpert Xpress SARS-CoV-2/FLU/RSV plus assay is intended as an aid in the diagnosis of influenza from Nasopharyngeal swab specimens and should not be used as a sole basis for treatment. Nasal washings and aspirates are unacceptable for Xpert Xpress SARS-CoV-2/FLU/RSV testing.  Fact Sheet for Patients: EntrepreneurPulse.com.au  Fact Sheet for Healthcare Providers: IncredibleEmployment.be  This test is not yet approved or cleared by the [place] FDA and has been authorized for detection and/or diagnosis of SARS-CoV-2 by FDA under an Emergency Use Authorization (EUA). This EUA will remain in effect (meaning this test can be used) for the duration of the COVID-19 declaration under Section 564(b)(1) of the Act, 21 U.S.C. section 360bbb-3(b)(1), unless the authorization is terminated or revoked.  Performed at [facility], [address], [place], [place] [address]   Resp Panel by RT-PCR (Flu A&B, Covid) Nasopharyngeal Swab     Status: Abnormal   Collection Time: 04/03/21 12:45 AM   Specimen: Nasopharyngeal Swab; Nasopharyngeal(NP) swabs in vial transport medium  Result Value Ref Range Status   SARS Coronavirus 2 by RT PCR NEGATIVE NEGATIVE Final    Comment: (NOTE) SARS-CoV-2 target nucleic acids are NOT DETECTED.  The SARS-CoV-2 RNA is generally detectable in upper respiratory specimens during the acute phase of infection. The lowest concentration of SARS-CoV-2 viral copies this assay can detect is 138 copies/mL. A negative result does not preclude SARS-Cov-2 infection and should not be used as the sole basis for treatment or other patient management decisions. A negative result may occur with  improper specimen collection/handling, submission of specimen other than nasopharyngeal swab, presence of viral mutation(s) within the areas targeted by this assay, and inadequate number of  viral copies(<138 copies/mL). A negative result must be combined with clinical observations, patient history, and epidemiological information. The expected result is Negative.  Fact Sheet for Patients:  EntrepreneurPulse.com.au  Fact Sheet for Healthcare Providers:  IncredibleEmployment.be  This test is no t yet approved or cleared by the [place] FDA and  has been authorized for detection and/or diagnosis of SARS-CoV-2 by FDA under an Emergency Use Authorization (EUA). This EUA will remain  in effect (meaning this test can be used) for the duration of the COVID-19 declaration under Section 564(b)(1) of the Act, 21 U.S.C.section 360bbb-3(b)(1), unless the authorization is terminated  or revoked sooner.       Influenza A by PCR POSITIVE (A) NEGATIVE Final   Influenza B by PCR NEGATIVE NEGATIVE Final    Comment: (NOTE) The Xpert Xpress SARS-CoV-2/FLU/RSV plus assay is intended as an aid in the diagnosis of influenza from Nasopharyngeal swab specimens and should not be used as a sole basis for treatment. Nasal washings and aspirates are unacceptable for Xpert Xpress SARS-CoV-2/FLU/RSV testing.  Fact Sheet for Patients: EntrepreneurPulse.com.au  Fact Sheet for Healthcare Providers: IncredibleEmployment.be  This test is not yet approved or cleared by the [place] FDA and has been authorized for detection and/or diagnosis of SARS-CoV-2 by FDA under an Emergency Use Authorization (EUA). This EUA will remain in effect (meaning this test can be used) for the duration of the COVID-19 declaration under Section 564(b)(1) of the Act, 21 U.S.C. section 360bbb-3(b)(1), unless the authorization is terminated or revoked.  Performed at [facility], [address], [place], [place] [address]      Scheduled Meds:  amLODipine  10 mg Oral Daily   aspirin EC  81 mg Oral Daily   benzonatate  200 mg Oral TID    budesonide (PULMICORT) nebulizer solution  0.25 mg Nebulization BID   carvedilol  25 mg Oral BID WC   clonazePAM  0.5 mg Oral BID   dapagliflozin propanediol  10 mg Oral QAC breakfast   dextromethorphan-guaiFENesin  1 tablet Oral BID   enoxaparin (LOVENOX) injection  40 mg Subcutaneous Q24H   gabapentin  800 mg Oral TID   hydrALAZINE  100 mg Oral TID   insulin aspart  0-15 Units Subcutaneous TID WC   insulin aspart  0-5 Units Subcutaneous QHS   [START ON 04/06/2021] insulin aspart  5 Units Subcutaneous TID WC   insulin glargine-yfgn  30 Units Subcutaneous QHS   ipratropium-albuterol  3 mL Nebulization Q6H   isosorbide mononitrate  30 mg Oral Daily   methylPREDNISolone (SOLU-MEDROL) injection  40 mg Intravenous Q12H   oseltamivir  75 mg Oral BID   sacubitril-valsartan  1 tablet Oral BID   sodium chloride flush  3 mL Intravenous Q12H   spironolactone  25 mg Oral Daily   torsemide  40 mg Oral BID   Continuous Infusions:  sodium chloride     sodium chloride Stopped (04/03/21 1116)    Procedures/Studies: DG Chest Port 1 View  Result Date: 04/03/2021 CLINICAL DATA:  Cough. EXAM: PORTABLE CHEST 1 VIEW COMPARISON:  Chest radiograph dated 03/27/2021. FINDINGS: Cardiomegaly with mild central vascular congestion. No focal consolidation, pleural effusion, or pneumothorax. No acute osseous pathology. IMPRESSION: Cardiomegaly with mild central vascular congestion. No focal consolidation. Electronically Signed   By: Anner Crete M.D.   On: 04/03/2021 01:37   DG Chest Portable 1 View  Result Date: 03/27/2021 CLINICAL DATA:  Shortness of breath, cough EXAM: PORTABLE CHEST 1 VIEW COMPARISON:  Previous studies including the examination of 02/02/2021 FINDINGS: Transverse diameter of heart is increased. There are no signs of pulmonary edema or focal pulmonary consolidation. There is no pleural effusion or pneumothorax. IMPRESSION: Cardiomegaly. There are no signs of pulmonary edema or focal  pulmonary consolidation. Electronically Signed   By: Elmer Picker M.D.   On: 03/27/2021 08:09   ECHOCARDIOGRAM COMPLETE  Result Date: 04/04/2021    ECHOCARDIOGRAM REPORT   Patient Name:   Chelsey Santiago Date of Exam: 04/04/2021 Medical Rec #:  [id number]       Height:       66.0 in Accession #:    [id number]      Weight:       225.0 lb Date of Birth:  [birth year]       BSA:          2.102 m Patient Age:    2 years        BP:           177/108 mmHg Patient Gender: F  HR:           76 bpm. Exam Location:  Inpatient Procedure: 2D Echo, Color Doppler and Cardiac Doppler Indications:    U44.03 Acute systolic (congestive) heart failure  History:        Patient has prior history of Echocardiogram examinations, most                 recent 03/28/2020. CHF; Risk Factors:Hypertension and Sleep                 Apnea.  Sonographer:    Raquel Sarna Senior RDCS Referring Phys: [phone] DAVID TAT IMPRESSIONS  1. Left ventricular ejection fraction, by estimation, is 30 to 35%. The left ventricle has moderately decreased function. The left ventricle demonstrates global hypokinesis. There is moderate left ventricular hypertrophy. Left ventricular diastolic parameters are consistent with Grade II diastolic dysfunction (pseudonormalization). Elevated left atrial pressure.  2. Right ventricular systolic function is normal. The right ventricular size is mildly enlarged.  3. Left atrial size was mildly dilated.  4. Right atrial size was mildly dilated.  5. The mitral valve is normal in structure. No evidence of mitral valve regurgitation. No evidence of mitral stenosis.  6. The aortic valve has an indeterminant number of cusps. Aortic valve regurgitation is not visualized. Mild aortic valve stenosis.  7. The inferior vena cava is normal in size with greater than 50% respiratory variability, suggesting right atrial pressure of 3 mmHg. FINDINGS  Left Ventricle: Left ventricular ejection fraction, by estimation, is 30 to 35%.  The left ventricle has moderately decreased function. The left ventricle demonstrates global hypokinesis. The left ventricular internal cavity size was normal in size. There is moderate left ventricular hypertrophy. Left ventricular diastolic parameters are consistent with Grade II diastolic dysfunction (pseudonormalization). Elevated left atrial pressure. Right Ventricle: The right ventricular size is mildly enlarged. Right ventricular systolic function is normal. Left Atrium: Left atrial size was mildly dilated. Right Atrium: Right atrial size was mildly dilated. Pericardium: There is no evidence of pericardial effusion. Mitral Valve: The mitral valve is normal in structure. No evidence of mitral valve regurgitation. No evidence of mitral valve stenosis. Tricuspid Valve: The tricuspid valve is normal in structure. Tricuspid valve regurgitation is trivial. No evidence of tricuspid stenosis. Aortic Valve: The aortic valve has an indeterminant number of cusps. Aortic valve regurgitation is not visualized. Mild aortic stenosis is present. Aortic valve mean gradient measures 10.0 mmHg. Aortic valve peak gradient measures 16.7 mmHg. Aortic valve  area, by VTI measures 2.02 cm. Pulmonic Valve: The pulmonic valve was normal in structure. Pulmonic valve regurgitation is not visualized. No evidence of pulmonic stenosis. Aorta: The aortic root is normal in size and structure. Venous: The inferior vena cava is normal in size with greater than 50% respiratory variability, suggesting right atrial pressure of 3 mmHg. IAS/Shunts: No atrial level shunt detected by color flow Doppler.  LEFT VENTRICLE PLAX 2D LVIDd:         5.30 cm      Diastology LVIDs:         4.30 cm      LV e' medial:    4.13 cm/s LV PW:         1.50 cm      LV E/e' medial:  21.2 LV IVS:        1.10 cm      LV e' lateral:   3.48 cm/s LVOT diam:     2.10 cm      LV E/e'  lateral: 25.1 LV SV:         65 LV SV Index:   31 LVOT Area:     3.46 cm  LV Volumes (MOD)  LV vol d, MOD A2C: 104.0 ml LV vol d, MOD A4C: 112.0 ml LV vol s, MOD A2C: 72.8 ml LV vol s, MOD A4C: 64.0 ml LV SV MOD A2C:     31.2 ml LV SV MOD A4C:     112.0 ml LV SV MOD BP:      39.8 ml RIGHT VENTRICLE RV S prime:     8.92 cm/s TAPSE (M-mode): 2.3 cm LEFT ATRIUM             Index        RIGHT ATRIUM           Index LA diam:        4.40 cm 2.09 cm/m   RA Area:     22.30 cm LA Vol (A2C):   48.6 ml 23.12 ml/m  RA Volume:   68.30 ml  32.49 ml/m LA Vol (A4C):   76.3 ml 36.29 ml/m LA Biplane Vol: 62.5 ml 29.73 ml/m  AORTIC VALVE AV Area (Vmax):    1.83 cm AV Area (Vmean):   2.01 cm AV Area (VTI):     2.02 cm AV Vmax:           204.50 cm/s AV Vmean:          142.500 cm/s AV VTI:            0.324 m AV Peak Grad:      16.7 mmHg AV Mean Grad:      10.0 mmHg LVOT Vmax:         108.00 cm/s LVOT Vmean:        82.600 cm/s LVOT VTI:          0.189 m LVOT/AV VTI ratio: 0.58  AORTA Ao Root diam: 2.80 cm Ao Asc diam:  3.30 cm MITRAL VALVE MV Area (PHT): 3.46 cm    SHUNTS MV Decel Time: 219 msec    Systemic VTI:  0.19 m MV E velocity: 87.40 cm/s  Systemic Diam: 2.10 cm MV A velocity: 57.80 cm/s MV E/A ratio:  1.51 Kirk Ruths MD Electronically signed by Kirk Ruths MD Signature Date/Time: 04/04/2021/2:36:04 PM    Final     Kathie Dike, MD  [facility]  If 7PM-7AM, please contact night-coverage [email or web address]  04/05/2021, 6:06 PM   LOS: 2 days

## 2021-04-05 NOTE — Progress Notes (Signed)
Patient refusing CPAP.

## 2021-04-06 DIAGNOSIS — I160 Hypertensive urgency: Secondary | ICD-10-CM | POA: Diagnosis not present

## 2021-04-06 DIAGNOSIS — J101 Influenza due to other identified influenza virus with other respiratory manifestations: Secondary | ICD-10-CM | POA: Diagnosis not present

## 2021-04-06 DIAGNOSIS — I5043 Acute on chronic combined systolic (congestive) and diastolic (congestive) heart failure: Secondary | ICD-10-CM | POA: Diagnosis not present

## 2021-04-06 DIAGNOSIS — E1165 Type 2 diabetes mellitus with hyperglycemia: Secondary | ICD-10-CM | POA: Diagnosis not present

## 2021-04-06 LAB — GLUCOSE, CAPILLARY
Glucose-Capillary: 284 mg/dL — ABNORMAL HIGH (ref 70–99)
Glucose-Capillary: 474 mg/dL — ABNORMAL HIGH (ref 70–99)
Glucose-Capillary: 410 mg/dL — ABNORMAL HIGH (ref 70–99)
Glucose-Capillary: 351 mg/dL — ABNORMAL HIGH (ref 70–99)
Glucose-Capillary: 224 mg/dL — ABNORMAL HIGH (ref 70–99)

## 2021-04-06 LAB — GLUCOSE, RANDOM: Glucose, Bld: 495 mg/dL — ABNORMAL HIGH (ref 70–99)

## 2021-04-06 LAB — BASIC METABOLIC PANEL
Anion gap: 11 (ref 5–15)
BUN: 23 mg/dL — ABNORMAL HIGH (ref 6–20)
CO2: 29 mmol/L (ref 22–32)
Calcium: 8.7 mg/dL — ABNORMAL LOW (ref 8.9–10.3)
Chloride: 93 mmol/L — ABNORMAL LOW (ref 98–111)
Creatinine, Ser: 1.26 mg/dL — ABNORMAL HIGH (ref 0.44–1.00)
GFR, Estimated: 54 mL/min — ABNORMAL LOW (ref >60)
Glucose, Bld: 323 mg/dL — ABNORMAL HIGH (ref 70–99)
Potassium: 3.8 mmol/L (ref 3.5–5.1)
Sodium: 133 mmol/L — ABNORMAL LOW (ref 135–145)

## 2021-04-06 MED ORDER — SODIUM CHLORIDE 0.9 % IV SOLN
INTRAVENOUS | Status: AC
Start: 2021-04-06 — End: 2021-04-07

## 2021-04-06 MED ORDER — LIVING WELL WITH DIABETES BOOK
Freq: Once | Status: AC
Start: 2021-04-06 — End: 2021-04-06

## 2021-04-06 MED ORDER — INSULIN GLARGINE-YFGN 100 UNIT/ML ~~LOC~~ SOLN
40.0000 [IU] | Freq: Every day | SUBCUTANEOUS | Status: DC
  Administered 2021-04-06 – 2021-04-08 (×3): 40 [IU] via SUBCUTANEOUS
  Filled 2021-04-06 (×4): qty 0.4

## 2021-04-06 MED ORDER — INSULIN ASPART 100 UNIT/ML IJ SOLN
25.0000 [IU] | Freq: Once | INTRAMUSCULAR | Status: AC
  Administered 2021-04-06: 25 [IU] via SUBCUTANEOUS

## 2021-04-06 NOTE — Progress Notes (Addendum)
Inpatient Diabetes Program Recommendations  AACE/ADA: New Consensus Statement on Inpatient Glycemic Control (2015)  Target Ranges:  Prepandial:   less than 140 mg/dL      Peak postprandial:   less than 180 mg/dL (1-2 hours)      Critically ill patients:  140 - 180 mg/dL   Lab Results  Component Value Date   GLUCAP 284 (H) 04/06/2021   HGBA1C 9.5 (H) 04/03/2021    Review of Glycemic Control  Latest Reference Range & Units 04/05/21 17:23 04/05/21 20:14 04/06/21 07:40  Glucose-Capillary 70 - 99 mg/dL 373 (H) 345 (H) 284 (H)  (H): Data is abnormally high  Diabetes history: DM2 Outpatient Diabetes medications:  Lantus 30 units QHS Humalog 10-16 units TID Farxiga 10 mg QD Victoza 1.2 QD (not taking) Current orders for Inpatient glycemic control:  Semglee 30 units QHS Novolog 0-15 units TID and 0-5 QHS Novolog 5 units TID Farxiga 10 mg QD Solumedrol 40 mg Q12H  Inpatient Diabetes Program Recommendations:    Hyperglycemia in the setting of steroids.  Please consider:  Semglee 40 units QHS  Addendum@[id number] : Spoke with patient on the phone.  She confirms above home medications.  Last office visit with Dr. Dorris Fetch was approximately 6 months ago.  Reviewed patient's current A1c of 9.5% (average blood sugar of 226 mg/dL). Explained what a A1c is and what it measures. Also reviewed goal A1c with patient, importance of good glucose control @ home, and blood sugar goals.  He states she rarely checks her blood sugar at home because it hurts.  She denies episodes of hypoglycemia but is aware of how to treat an signs and symptoms.  She admits to occasionally drinking beverages with sugar.  Encouraged her to avoid these beverages.  She states she is running low on insulin and insulin pen needles.  Encouraged her to call today and make a follow up appointment with Dr. Dorris Fetch.    MD for DC please consider: Lantus insulin [id number] Humalog [id number] Insulin pen [phone]  Will continue to  follow while inpatient.  Thank you, Reche Dixon, RN, BSN Diabetes Coordinator Inpatient Diabetes Program [phone] (team pager from 8a-5p)

## 2021-04-06 NOTE — Progress Notes (Signed)
PROGRESS NOTE  Chelsey Santiago MPN:[id number] DOB: [birth year] DOA: 04/02/2021 PCP: Rosita Fire, MD  Brief History:  45 year old female with a history of systolic and diastolic CHF, hypertensive cardiomyopathy, diabetes mellitus type 2, anxiety, OSA, iron deficiency anemia presenting with 1 week history of shortness of breath, myalgias, generalized weakness, and nonproductive cough.  She has had some subjective fevers and chills.  She also had some nausea and vomiting on the day of admission without any hematemesis, hematochezia, melena.  She describes some intermittent chest discomfort and epigastric discomfort although she has not able to clarify the duration, intensity, or variable factors. Notably, the patient recently saw cardiology on 02/20/2021.  It was noted that the patient had a 10 pound weight gain since 02/02/2021.  The patient was started on Farxiga at that time.  She was instructed to continue torsemide 40 twice daily and weigh herself daily.  The patient has not been weighing herself. In the emergency department, the patient had low-grade temperature 99.4 F.  She was mildly tachycardic with blood pressure up to 201/130.  Chest x-ray showed increasing vascular congestion.  BMP showed sodium 133, potassium 3.2, CO2 28, serum creatinine 1.27.  WBC 8.7, hemoglobin 11.5, platelets 252,000.  Influenza was positive.  Patient was started on oseltamavir and IV furosemide.  Assessment/Plan: Acute on chronic systolic and diastolic CHF -She does not appear to be massively fluid overloaded on exam -I feel that her interstitial markings on chest x-ray are more reflective of the patient's influenza with pulmonary manifestations -Patient given IV furosemide in the ED -Echocardiogram updated and shows that EF remains 30 to 35% -Continue carvedilol, Farxiga, spironolactone, Entresto -Appears to be becoming mildly dehydrated -Hold further torsemide for now.  We will provide gentle hydration  overnight.  Influenza A with pulmonary manifestations -Continue oseltamivir -Continue pulmonary hygiene - she is on bronchodilators and inhaled steroids -She was started on steroids for wheezing, but blood sugars become significantly elevated -Overall wheezing appears to be improving, will discontinue further systemic steroids  Hypertensive urgency -Continue hydralazine, Imdur, carvedilol, spironolactone, amlodipine, Imdur  Uncontrolled diabetes mellitus type 2 with hyperglycemia -Currently on basal insulin -NovoLog sliding scale -Hemoglobin A1c 9.5 -Severe hypoglycemia today related to steroids, blood sugar up to 495 -Increase basal insulin and add meal coverage NovoLog  Depression/anxiety -Restarted Abilify, clonazepam  Peripheral neuropathy -Restarted home dose of gabapentin  Atypical chest pain -Personally reviewed EKG--sinus rhythm, nonspecific ST-T wave change -Troponins have remained flat, unlikely ACS  Obesity class II -BMI 34.5 -Lifestyle modification  Hypokalemia/hypomagnesemia -Replaced    Status is: Inpatient  Remains inpatient appropriate because: severity of illness requiring IV lasix        Family Communication:  no Family at bedside  Consultants:  none  Code Status:  FULL   DVT Prophylaxis:  [place] Lovenox   Procedures: As Listed in Progress Note Above  Antibiotics: None      Subjective: Reports continued cough and wheezing.  Reports that she gets short of breath on ambulation.  Of note, she did ambulate down the hall today.  She does feel somewhat tired today.  Objective: Vitals:   04/06/21 0717 04/06/21 1250 04/06/21 1406 04/06/21 1925  BP:  (!) 99/52    Pulse:  81    Resp:  20    Temp:      TempSrc:      SpO2: 96% 90% 93% 92%  Weight:      Height:  Intake/Output Summary (Last 24 hours) at 04/06/2021 1946 Last data filed at 04/06/2021 1724 Gross per 24 hour  Intake 419.8 ml  Output --  Net 419.8 ml   Weight  change: -1.6 kg Exam:  General exam: Alert, awake, oriented x 3 Respiratory system: mild wheeze bilaterally, improving. Respiratory effort normal. Cardiovascular system:RRR. No murmurs, rubs, gallops. Gastrointestinal system: Abdomen is nondistended, soft and nontender. No organomegaly or masses felt. Normal bowel sounds heard. Central nervous system: Alert and oriented. No focal neurological deficits. Extremities: No C/C/E, +pedal pulses Skin: No rashes, lesions or ulcers Psychiatry: Judgement and insight appear normal. Mood & affect appropriate.     Data Reviewed: I have personally reviewed following labs and imaging studies Basic Metabolic Panel: Recent Labs  Lab 04/03/21 0051 04/03/21 1907 04/04/21 0631 04/05/21 0511 04/06/21 0347 04/06/21 1149  NA 133*  --  134* 138 133*  --   K 3.2*  --  2.7* 3.9 3.8  --   CL 93*  --  93* 97* 93*  --   CO2 28  --  31 30 29   --   GLUCOSE 236*  --  142* 208* 323* 495*  BUN 15  --  16 14 23*  --   CREATININE 1.27*  --  0.89 1.02* 1.26*  --   CALCIUM 8.1*  --  7.6* 8.3* 8.7*  --   MG  --  1.4* 1.4* 2.3  --   --   PHOS  --  2.9  --   --   --   --    Liver Function Tests: Recent Labs  Lab 04/04/21 0631  AST 22  ALT 11  ALKPHOS 65  BILITOT 0.8  PROT 6.8  ALBUMIN 2.9*   No results for input(s): LIPASE, AMYLASE in the last 168 hours. No results for input(s): AMMONIA in the last 168 hours. Coagulation Profile: Recent Labs  Lab 04/04/21 0631  INR 1.2   CBC: Recent Labs  Lab 04/03/21 0051 04/04/21 0631  WBC 8.7 5.4  NEUTROABS 6.3  --   HGB 11.5* 11.8*  HCT 36.8 38.0  MCV 74.0* 73.4*  PLT 252 228   Cardiac Enzymes: No results for input(s): CKTOTAL, CKMB, CKMBINDEX, TROPONINI in the last 168 hours. BNP: Invalid input(s): POCBNP CBG: Recent Labs  Lab 04/05/21 2014 04/06/21 0740 04/06/21 1126 04/06/21 1422 04/06/21 1623  GLUCAP 345* 284* 474* 410* 351*   HbA1C: No results for input(s): HGBA1C in the last 72  hours.  Urine analysis:    Component Value Date/Time   COLORURINE YELLOW 08/28/2019 1226   APPEARANCEUR CLEAR 08/28/2019 1226   LABSPEC 1.028 08/28/2019 1226   PHURINE 6.0 08/28/2019 1226   GLUCOSEU >=500 (A) 08/28/2019 1226   HGBUR NEGATIVE 08/28/2019 1226   BILIRUBINUR NEGATIVE 08/28/2019 1226   KETONESUR NEGATIVE 08/28/2019 1226   PROTEINUR Negative 12/19/2019 1010   PROTEINUR NEGATIVE 08/28/2019 1226   UROBILINOGEN 0.2 04/29/2014 1115   NITRITE n 12/19/2019 1010   NITRITE NEGATIVE 08/28/2019 1226   LEUKOCYTESUR Negative 12/19/2019 1010   LEUKOCYTESUR TRACE (A) 08/28/2019 1226   Sepsis Labs: @LABRCNTIP (procalcitonin:4,lacticidven:4) ) Recent Results (from the past 240 hour(s))  Resp Panel by RT-PCR (Flu A&B, Covid) Nasopharyngeal Swab     Status: Abnormal   Collection Time: 04/03/21 12:45 AM   Specimen: Nasopharyngeal Swab; Nasopharyngeal(NP) swabs in vial transport medium  Result Value Ref Range Status   SARS Coronavirus 2 by RT PCR NEGATIVE NEGATIVE Final    Comment: (NOTE) SARS-CoV-2 target nucleic acids are  NOT DETECTED.  The SARS-CoV-2 RNA is generally detectable in upper respiratory specimens during the acute phase of infection. The lowest concentration of SARS-CoV-2 viral copies this assay can detect is 138 copies/mL. A negative result does not preclude SARS-Cov-2 infection and should not be used as the sole basis for treatment or other patient management decisions. A negative result may occur with  improper specimen collection/handling, submission of specimen other than nasopharyngeal swab, presence of viral mutation(s) within the areas targeted by this assay, and inadequate number of viral copies(<138 copies/mL). A negative result must be combined with clinical observations, patient history, and epidemiological information. The expected result is Negative.  Fact Sheet for Patients:  EntrepreneurPulse.com.au  Fact Sheet for Healthcare  Providers:  IncredibleEmployment.be  This test is no t yet approved or cleared by the [place] FDA and  has been authorized for detection and/or diagnosis of SARS-CoV-2 by FDA under an Emergency Use Authorization (EUA). This EUA will remain  in effect (meaning this test can be used) for the duration of the COVID-19 declaration under Section 564(b)(1) of the Act, 21 U.S.C.section 360bbb-3(b)(1), unless the authorization is terminated  or revoked sooner.       Influenza A by PCR POSITIVE (A) NEGATIVE Final   Influenza B by PCR NEGATIVE NEGATIVE Final    Comment: (NOTE) The Xpert Xpress SARS-CoV-2/FLU/RSV plus assay is intended as an aid in the diagnosis of influenza from Nasopharyngeal swab specimens and should not be used as a sole basis for treatment. Nasal washings and aspirates are unacceptable for Xpert Xpress SARS-CoV-2/FLU/RSV testing.  Fact Sheet for Patients: EntrepreneurPulse.com.au  Fact Sheet for Healthcare Providers: IncredibleEmployment.be  This test is not yet approved or cleared by the [place] FDA and has been authorized for detection and/or diagnosis of SARS-CoV-2 by FDA under an Emergency Use Authorization (EUA). This EUA will remain in effect (meaning this test can be used) for the duration of the COVID-19 declaration under Section 564(b)(1) of the Act, 21 U.S.C. section 360bbb-3(b)(1), unless the authorization is terminated or revoked.  Performed at [facility], [address] [place], [place] [address]      Scheduled Meds:  amLODipine  10 mg Oral Daily   aspirin EC  81 mg Oral Daily   benzonatate  200 mg Oral TID   budesonide (PULMICORT) nebulizer solution  0.25 mg Nebulization BID   carvedilol  25 mg Oral BID WC   clonazePAM  0.5 mg Oral BID   dapagliflozin propanediol  10 mg Oral QAC breakfast   dextromethorphan-guaiFENesin  1 tablet Oral BID   enoxaparin (LOVENOX) injection  40 mg  Subcutaneous Q24H   gabapentin  800 mg Oral TID   hydrALAZINE  100 mg Oral TID   insulin aspart  0-15 Units Subcutaneous TID WC   insulin aspart  0-5 Units Subcutaneous QHS   insulin aspart  5 Units Subcutaneous TID WC   insulin glargine-yfgn  40 Units Subcutaneous QHS   ipratropium-albuterol  3 mL Nebulization Q6H   isosorbide mononitrate  30 mg Oral Daily   oseltamivir  75 mg Oral BID   sacubitril-valsartan  1 tablet Oral BID   sodium chloride flush  3 mL Intravenous Q12H   spironolactone  25 mg Oral Daily   Continuous Infusions:  sodium chloride     sodium chloride Stopped (04/03/21 1116)   sodium chloride 75 mL/hr at 04/06/21 1724    Procedures/Studies: DG Chest Port 1 View  Result Date: 04/03/2021 CLINICAL DATA:  Cough. EXAM: PORTABLE CHEST  1 VIEW COMPARISON:  Chest radiograph dated 03/27/2021. FINDINGS: Cardiomegaly with mild central vascular congestion. No focal consolidation, pleural effusion, or pneumothorax. No acute osseous pathology. IMPRESSION: Cardiomegaly with mild central vascular congestion. No focal consolidation. Electronically Signed   By: Anner Crete M.D.   On: 04/03/2021 01:37   DG Chest Portable 1 View  Result Date: 03/27/2021 CLINICAL DATA:  Shortness of breath, cough EXAM: PORTABLE CHEST 1 VIEW COMPARISON:  Previous studies including the examination of 02/02/2021 FINDINGS: Transverse diameter of heart is increased. There are no signs of pulmonary edema or focal pulmonary consolidation. There is no pleural effusion or pneumothorax. IMPRESSION: Cardiomegaly. There are no signs of pulmonary edema or focal pulmonary consolidation. Electronically Signed   By: Elmer Picker M.D.   On: 03/27/2021 08:09   ECHOCARDIOGRAM COMPLETE  Result Date: 04/04/2021    ECHOCARDIOGRAM REPORT   Patient Name:   Chelsey Santiago Date of Exam: 04/04/2021 Medical Rec #:  [id number]       Height:       66.0 in Accession #:    [id number]      Weight:       225.0 lb Date of  Birth:  [birth year]       BSA:          2.102 m Patient Age:    69 years        BP:           177/108 mmHg Patient Gender: F               HR:           76 bpm. Exam Location:  Inpatient Procedure: 2D Echo, Color Doppler and Cardiac Doppler Indications:    I33.82 Acute systolic (congestive) heart failure  History:        Patient has prior history of Echocardiogram examinations, most                 recent 03/28/2020. CHF; Risk Factors:Hypertension and Sleep                 Apnea.  Sonographer:    Raquel Sarna Senior RDCS Referring Phys: [phone] DAVID TAT IMPRESSIONS  1. Left ventricular ejection fraction, by estimation, is 30 to 35%. The left ventricle has moderately decreased function. The left ventricle demonstrates global hypokinesis. There is moderate left ventricular hypertrophy. Left ventricular diastolic parameters are consistent with Grade II diastolic dysfunction (pseudonormalization). Elevated left atrial pressure.  2. Right ventricular systolic function is normal. The right ventricular size is mildly enlarged.  3. Left atrial size was mildly dilated.  4. Right atrial size was mildly dilated.  5. The mitral valve is normal in structure. No evidence of mitral valve regurgitation. No evidence of mitral stenosis.  6. The aortic valve has an indeterminant number of cusps. Aortic valve regurgitation is not visualized. Mild aortic valve stenosis.  7. The inferior vena cava is normal in size with greater than 50% respiratory variability, suggesting right atrial pressure of 3 mmHg. FINDINGS  Left Ventricle: Left ventricular ejection fraction, by estimation, is 30 to 35%. The left ventricle has moderately decreased function. The left ventricle demonstrates global hypokinesis. The left ventricular internal cavity size was normal in size. There is moderate left ventricular hypertrophy. Left ventricular diastolic parameters are consistent with Grade II diastolic dysfunction (pseudonormalization). Elevated left atrial pressure.  Right Ventricle: The right ventricular size is mildly enlarged. Right ventricular systolic function is normal. Left Atrium: Left atrial size was mildly dilated. Right Atrium:  Right atrial size was mildly dilated. Pericardium: There is no evidence of pericardial effusion. Mitral Valve: The mitral valve is normal in structure. No evidence of mitral valve regurgitation. No evidence of mitral valve stenosis. Tricuspid Valve: The tricuspid valve is normal in structure. Tricuspid valve regurgitation is trivial. No evidence of tricuspid stenosis. Aortic Valve: The aortic valve has an indeterminant number of cusps. Aortic valve regurgitation is not visualized. Mild aortic stenosis is present. Aortic valve mean gradient measures 10.0 mmHg. Aortic valve peak gradient measures 16.7 mmHg. Aortic valve  area, by VTI measures 2.02 cm. Pulmonic Valve: The pulmonic valve was normal in structure. Pulmonic valve regurgitation is not visualized. No evidence of pulmonic stenosis. Aorta: The aortic root is normal in size and structure. Venous: The inferior vena cava is normal in size with greater than 50% respiratory variability, suggesting right atrial pressure of 3 mmHg. IAS/Shunts: No atrial level shunt detected by color flow Doppler.  LEFT VENTRICLE PLAX 2D LVIDd:         5.30 cm      Diastology LVIDs:         4.30 cm      LV e' medial:    4.13 cm/s LV PW:         1.50 cm      LV E/e' medial:  21.2 LV IVS:        1.10 cm      LV e' lateral:   3.48 cm/s LVOT diam:     2.10 cm      LV E/e' lateral: 25.1 LV SV:         65 LV SV Index:   31 LVOT Area:     3.46 cm  LV Volumes (MOD) LV vol d, MOD A2C: 104.0 ml LV vol d, MOD A4C: 112.0 ml LV vol s, MOD A2C: 72.8 ml LV vol s, MOD A4C: 64.0 ml LV SV MOD A2C:     31.2 ml LV SV MOD A4C:     112.0 ml LV SV MOD BP:      39.8 ml RIGHT VENTRICLE RV S prime:     8.92 cm/s TAPSE (M-mode): 2.3 cm LEFT ATRIUM             Index        RIGHT ATRIUM           Index LA diam:        4.40 cm 2.09 cm/m    RA Area:     22.30 cm LA Vol (A2C):   48.6 ml 23.12 ml/m  RA Volume:   68.30 ml  32.49 ml/m LA Vol (A4C):   76.3 ml 36.29 ml/m LA Biplane Vol: 62.5 ml 29.73 ml/m  AORTIC VALVE AV Area (Vmax):    1.83 cm AV Area (Vmean):   2.01 cm AV Area (VTI):     2.02 cm AV Vmax:           204.50 cm/s AV Vmean:          142.500 cm/s AV VTI:            0.324 m AV Peak Grad:      16.7 mmHg AV Mean Grad:      10.0 mmHg LVOT Vmax:         108.00 cm/s LVOT Vmean:        82.600 cm/s LVOT VTI:          0.189 m LVOT/AV VTI ratio: 0.58  AORTA Ao Root diam:  2.80 cm Ao Asc diam:  3.30 cm MITRAL VALVE MV Area (PHT): 3.46 cm    SHUNTS MV Decel Time: 219 msec    Systemic VTI:  0.19 m MV E velocity: 87.40 cm/s  Systemic Diam: 2.10 cm MV A velocity: 57.80 cm/s MV E/A ratio:  1.51 Kirk Ruths MD Electronically signed by Kirk Ruths MD Signature Date/Time: 04/04/2021/2:36:04 PM    Final     Kathie Dike, MD  [facility]  If 7PM-7AM, please contact night-coverage [email or web address]  04/06/2021, 7:46 PM   LOS: 3 days

## 2021-04-06 NOTE — Progress Notes (Signed)
Pt still refuses CPAP machine

## 2021-04-07 ENCOUNTER — Inpatient Hospital Stay (HOSPITAL_COMMUNITY): Payer: Medicaid Other

## 2021-04-07 DIAGNOSIS — E1165 Type 2 diabetes mellitus with hyperglycemia: Secondary | ICD-10-CM | POA: Diagnosis not present

## 2021-04-07 DIAGNOSIS — J101 Influenza due to other identified influenza virus with other respiratory manifestations: Secondary | ICD-10-CM | POA: Diagnosis not present

## 2021-04-07 DIAGNOSIS — I5043 Acute on chronic combined systolic (congestive) and diastolic (congestive) heart failure: Secondary | ICD-10-CM | POA: Diagnosis not present

## 2021-04-07 DIAGNOSIS — I160 Hypertensive urgency: Secondary | ICD-10-CM | POA: Diagnosis not present

## 2021-04-07 LAB — BASIC METABOLIC PANEL
Anion gap: 9 (ref 5–15)
BUN: 32 mg/dL — ABNORMAL HIGH (ref 6–20)
CO2: 29 mmol/L (ref 22–32)
Calcium: 8.8 mg/dL — ABNORMAL LOW (ref 8.9–10.3)
Chloride: 96 mmol/L — ABNORMAL LOW (ref 98–111)
Creatinine, Ser: 1.2 mg/dL — ABNORMAL HIGH (ref 0.44–1.00)
GFR, Estimated: 57 mL/min — ABNORMAL LOW (ref >60)
Glucose, Bld: 235 mg/dL — ABNORMAL HIGH (ref 70–99)
Potassium: 3.9 mmol/L (ref 3.5–5.1)
Sodium: 134 mmol/L — ABNORMAL LOW (ref 135–145)

## 2021-04-07 LAB — GLUCOSE, CAPILLARY
Glucose-Capillary: 219 mg/dL — ABNORMAL HIGH (ref 70–99)
Glucose-Capillary: 252 mg/dL — ABNORMAL HIGH (ref 70–99)
Glucose-Capillary: 178 mg/dL — ABNORMAL HIGH (ref 70–99)
Glucose-Capillary: 204 mg/dL — ABNORMAL HIGH (ref 70–99)

## 2021-04-07 MED ORDER — IPRATROPIUM-ALBUTEROL 0.5-2.5 (3) MG/3ML IN SOLN
3.0000 mL | Freq: Three times a day (TID) | RESPIRATORY_TRACT | Status: DC
  Administered 2021-04-08 – 2021-04-09 (×4): 3 mL via RESPIRATORY_TRACT
  Filled 2021-04-07 (×4): qty 3

## 2021-04-07 MED ORDER — ACETYLCYSTEINE 20 % IN SOLN
4.0000 mL | Freq: Three times a day (TID) | RESPIRATORY_TRACT | Status: DC
  Administered 2021-04-07 – 2021-04-08 (×4): 4 mL via ORAL
  Filled 2021-04-07 (×2): qty 4

## 2021-04-07 MED ORDER — TRAZODONE HCL 50 MG PO TABS
25.0000 mg | ORAL_TABLET | Freq: Once | ORAL | Status: AC
  Administered 2021-04-07: 25 mg via ORAL
  Filled 2021-04-07: qty 1

## 2021-04-07 MED ORDER — IPRATROPIUM-ALBUTEROL 0.5-2.5 (3) MG/3ML IN SOLN: 3.0000 mL | RESPIRATORY_TRACT | Status: DC | PRN

## 2021-04-07 NOTE — Progress Notes (Signed)
PROGRESS NOTE  Simona Rocque MBT:[id number] DOB: [birth year] DOA: 04/02/2021 PCP: Rosita Fire, MD  Brief History:  45 year old female with a history of systolic and diastolic CHF, hypertensive cardiomyopathy, diabetes mellitus type 2, anxiety, OSA, iron deficiency anemia presenting with 1 week history of shortness of breath, myalgias, generalized weakness, and nonproductive cough.  She has had some subjective fevers and chills.  She also had some nausea and vomiting on the day of admission without any hematemesis, hematochezia, melena.  She describes some intermittent chest discomfort and epigastric discomfort although she has not able to clarify the duration, intensity, or variable factors. Notably, the patient recently saw cardiology on 02/20/2021.  It was noted that the patient had a 10 pound weight gain since 02/02/2021.  The patient was started on Farxiga at that time.  She was instructed to continue torsemide 40 twice daily and weigh herself daily.  The patient has not been weighing herself. In the emergency department, the patient had low-grade temperature 99.4 F.  She was mildly tachycardic with blood pressure up to 201/130.  Chest x-ray showed increasing vascular congestion.  BMP showed sodium 133, potassium 3.2, CO2 28, serum creatinine 1.27.  WBC 8.7, hemoglobin 11.5, platelets 252,000.  Influenza was positive.  Patient was started on oseltamavir and IV furosemide.  Assessment/Plan: Acute on chronic systolic and diastolic CHF -She does not appear to be massively fluid overloaded on exam -I feel that her interstitial markings on chest x-ray are more reflective of the patient's influenza with pulmonary manifestations -Patient given IV furosemide in the ED -Echocardiogram updated and shows that EF remains 30 to 35% -Continue carvedilol, Farxiga, spironolactone, Entresto -Appears to be becoming mildly dehydrated -Hold further torsemide for now.    Influenza A with pulmonary  manifestations -Continue oseltamivir -Continue pulmonary hygiene - she is on bronchodilators and inhaled steroids -She was started on steroids for wheezing, but blood sugars become significantly elevated -steroids discontinued -will start on mucomyst nebs for persistent cough and to help bring up sputum -recheck chest xray since cough has persisted  Hypertensive urgency -Continue hydralazine, Imdur, carvedilol, spironolactone, amlodipine, Imdur  Uncontrolled diabetes mellitus type 2 with hyperglycemia -Currently on basal insulin -NovoLog sliding scale -Hemoglobin A1c 9.5 -Blood sugars improving since steroids discontinued  Depression/anxiety -Restarted Abilify, clonazepam  Peripheral neuropathy -Restarted home dose of gabapentin  Atypical chest pain -Personally reviewed EKG--sinus rhythm, nonspecific ST-T wave change -Troponins have remained flat, unlikely ACS  Obesity class II -BMI 34.5 -Lifestyle modification  Hypokalemia/hypomagnesemia -Replaced    Status is: Inpatient  Remains inpatient appropriate because: severity of illness requiring IV lasix        Family Communication:  no Family at bedside  Consultants:  none  Code Status:  FULL   DVT Prophylaxis:  [place] Lovenox   Procedures: As Listed in Progress Note Above  Antibiotics: None      Subjective: She reports continued coughing and shortness of breath. Says she was not able to sleep last night due to cough. During my visit, she is repeatedly coughing. Difficulty expectorating sputum  Objective: Vitals:   04/07/21 0759 04/07/21 1355 04/07/21 2032 04/07/21 2056  BP:    116/73  Pulse:    81  Resp:    19  Temp:    97.7 F (36.5 C)  TempSrc:    Oral  SpO2: 94% 94% 94% 92%  Weight:      Height:        Intake/Output Summary (Last  24 hours) at 04/07/2021 2213 Last data filed at 04/07/2021 1739 Gross per 24 hour  Intake 1378.8 ml  Output 450 ml  Net 928.8 ml   Weight change: 3.226  kg Exam:  General exam: Alert, awake, oriented x 3 Respiratory system: scattered rhonchi, Respiratory effort normal. Cardiovascular system:RRR. No murmurs, rubs, gallops. Gastrointestinal system: Abdomen is nondistended, soft and nontender. No organomegaly or masses felt. Normal bowel sounds heard. Central nervous system: Alert and oriented. No focal neurological deficits. Extremities: No C/C/E, +pedal pulses Skin: No rashes, lesions or ulcers Psychiatry: Judgement and insight appear normal. Mood & affect appropriate.     Data Reviewed: I have personally reviewed following labs and imaging studies Basic Metabolic Panel: Recent Labs  Lab 04/03/21 0051 04/03/21 1907 04/04/21 0631 04/05/21 0511 04/06/21 0347 04/06/21 1149 04/07/21 0708  NA 133*  --  134* 138 133*  --  134*  K 3.2*  --  2.7* 3.9 3.8  --  3.9  CL 93*  --  93* 97* 93*  --  96*  CO2 28  --  31 30 29   --  29  GLUCOSE 236*  --  142* 208* 323* 495* 235*  BUN 15  --  16 14 23*  --  32*  CREATININE 1.27*  --  0.89 1.02* 1.26*  --  1.20*  CALCIUM 8.1*  --  7.6* 8.3* 8.7*  --  8.8*  MG  --  1.4* 1.4* 2.3  --   --   --   PHOS  --  2.9  --   --   --   --   --    Liver Function Tests: Recent Labs  Lab 04/04/21 0631  AST 22  ALT 11  ALKPHOS 65  BILITOT 0.8  PROT 6.8  ALBUMIN 2.9*   No results for input(s): LIPASE, AMYLASE in the last 168 hours. No results for input(s): AMMONIA in the last 168 hours. Coagulation Profile: Recent Labs  Lab 04/04/21 0631  INR 1.2   CBC: Recent Labs  Lab 04/03/21 0051 04/04/21 0631  WBC 8.7 5.4  NEUTROABS 6.3  --   HGB 11.5* 11.8*  HCT 36.8 38.0  MCV 74.0* 73.4*  PLT 252 228   Cardiac Enzymes: No results for input(s): CKTOTAL, CKMB, CKMBINDEX, TROPONINI in the last 168 hours. BNP: Invalid input(s): POCBNP CBG: Recent Labs  Lab 04/06/21 2222 04/07/21 0732 04/07/21 1123 04/07/21 1611 04/07/21 2100  GLUCAP 224* 219* 252* 178* 204*   HbA1C: No results for  input(s): HGBA1C in the last 72 hours.  Urine analysis:    Component Value Date/Time   COLORURINE YELLOW 08/28/2019 1226   APPEARANCEUR CLEAR 08/28/2019 1226   LABSPEC 1.028 08/28/2019 1226   PHURINE 6.0 08/28/2019 1226   GLUCOSEU >=500 (A) 08/28/2019 1226   HGBUR NEGATIVE 08/28/2019 1226   BILIRUBINUR NEGATIVE 08/28/2019 1226   KETONESUR NEGATIVE 08/28/2019 1226   PROTEINUR Negative 12/19/2019 1010   PROTEINUR NEGATIVE 08/28/2019 1226   UROBILINOGEN 0.2 04/29/2014 1115   NITRITE n 12/19/2019 1010   NITRITE NEGATIVE 08/28/2019 1226   LEUKOCYTESUR Negative 12/19/2019 1010   LEUKOCYTESUR TRACE (A) 08/28/2019 1226   Sepsis Labs: @LABRCNTIP (procalcitonin:4,lacticidven:4) ) Recent Results (from the past 240 hour(s))  Resp Panel by RT-PCR (Flu A&B, Covid) Nasopharyngeal Swab     Status: Abnormal   Collection Time: 04/03/21 12:45 AM   Specimen: Nasopharyngeal Swab; Nasopharyngeal(NP) swabs in vial transport medium  Result Value Ref Range Status   SARS Coronavirus 2 by RT PCR NEGATIVE  NEGATIVE Final    Comment: (NOTE) SARS-CoV-2 target nucleic acids are NOT DETECTED.  The SARS-CoV-2 RNA is generally detectable in upper respiratory specimens during the acute phase of infection. The lowest concentration of SARS-CoV-2 viral copies this assay can detect is 138 copies/mL. A negative result does not preclude SARS-Cov-2 infection and should not be used as the sole basis for treatment or other patient management decisions. A negative result may occur with  improper specimen collection/handling, submission of specimen other than nasopharyngeal swab, presence of viral mutation(s) within the areas targeted by this assay, and inadequate number of viral copies(<138 copies/mL). A negative result must be combined with clinical observations, patient history, and epidemiological information. The expected result is Negative.  Fact Sheet for Patients:   EntrepreneurPulse.com.au  Fact Sheet for Healthcare Providers:  IncredibleEmployment.be  This test is no t yet approved or cleared by the [place] FDA and  has been authorized for detection and/or diagnosis of SARS-CoV-2 by FDA under an Emergency Use Authorization (EUA). This EUA will remain  in effect (meaning this test can be used) for the duration of the COVID-19 declaration under Section 564(b)(1) of the Act, 21 U.S.C.section 360bbb-3(b)(1), unless the authorization is terminated  or revoked sooner.       Influenza A by PCR POSITIVE (A) NEGATIVE Final   Influenza B by PCR NEGATIVE NEGATIVE Final    Comment: (NOTE) The Xpert Xpress SARS-CoV-2/FLU/RSV plus assay is intended as an aid in the diagnosis of influenza from Nasopharyngeal swab specimens and should not be used as a sole basis for treatment. Nasal washings and aspirates are unacceptable for Xpert Xpress SARS-CoV-2/FLU/RSV testing.  Fact Sheet for Patients: EntrepreneurPulse.com.au  Fact Sheet for Healthcare Providers: IncredibleEmployment.be  This test is not yet approved or cleared by the [place] FDA and has been authorized for detection and/or diagnosis of SARS-CoV-2 by FDA under an Emergency Use Authorization (EUA). This EUA will remain in effect (meaning this test can be used) for the duration of the COVID-19 declaration under Section 564(b)(1) of the Act, 21 U.S.C. section 360bbb-3(b)(1), unless the authorization is terminated or revoked.  Performed at [facility], [address], [place], [place] [address]      Scheduled Meds:  acetylcysteine  4 mL Oral TID   amLODipine  10 mg Oral Daily   aspirin EC  81 mg Oral Daily   benzonatate  200 mg Oral TID   budesonide (PULMICORT) nebulizer solution  0.25 mg Nebulization BID   carvedilol  25 mg Oral BID WC   clonazePAM  0.5 mg Oral BID   dapagliflozin propanediol  10 mg Oral  QAC breakfast   dextromethorphan-guaiFENesin  1 tablet Oral BID   enoxaparin (LOVENOX) injection  40 mg Subcutaneous Q24H   gabapentin  800 mg Oral TID   hydrALAZINE  100 mg Oral TID   insulin aspart  0-15 Units Subcutaneous TID WC   insulin aspart  0-5 Units Subcutaneous QHS   insulin aspart  5 Units Subcutaneous TID WC   insulin glargine-yfgn  40 Units Subcutaneous QHS   ipratropium-albuterol  3 mL Nebulization Q6H   isosorbide mononitrate  30 mg Oral Daily   sacubitril-valsartan  1 tablet Oral BID   sodium chloride flush  3 mL Intravenous Q12H   Continuous Infusions:  sodium chloride     sodium chloride Stopped (04/03/21 1116)    Procedures/Studies: DG Chest Port 1 View  Result Date: 04/03/2021 CLINICAL DATA:  Cough. EXAM: PORTABLE CHEST 1 VIEW COMPARISON:  Chest  radiograph dated 03/27/2021. FINDINGS: Cardiomegaly with mild central vascular congestion. No focal consolidation, pleural effusion, or pneumothorax. No acute osseous pathology. IMPRESSION: Cardiomegaly with mild central vascular congestion. No focal consolidation. Electronically Signed   By: Anner Crete M.D.   On: 04/03/2021 01:37   DG Chest Portable 1 View  Result Date: 03/27/2021 CLINICAL DATA:  Shortness of breath, cough EXAM: PORTABLE CHEST 1 VIEW COMPARISON:  Previous studies including the examination of 02/02/2021 FINDINGS: Transverse diameter of heart is increased. There are no signs of pulmonary edema or focal pulmonary consolidation. There is no pleural effusion or pneumothorax. IMPRESSION: Cardiomegaly. There are no signs of pulmonary edema or focal pulmonary consolidation. Electronically Signed   By: Elmer Picker M.D.   On: 03/27/2021 08:09   ECHOCARDIOGRAM COMPLETE  Result Date: 04/04/2021    ECHOCARDIOGRAM REPORT   Patient Name:   BRENT NOTO Date of Exam: 04/04/2021 Medical Rec #:  [id number]       Height:       66.0 in Accession #:    [id number]      Weight:       225.0 lb Date of Birth:   10-24-1975       BSA:          2.102 m Patient Age:    26 years        BP:           177/108 mmHg Patient Gender: F               HR:           76 bpm. Exam Location:  Inpatient Procedure: 2D Echo, Color Doppler and Cardiac Doppler Indications:    E26.83 Acute systolic (congestive) heart failure  History:        Patient has prior history of Echocardiogram examinations, most                 recent 03/28/2020. CHF; Risk Factors:Hypertension and Sleep                 Apnea.  Sonographer:    Raquel Sarna Senior RDCS Referring Phys: [phone] DAVID TAT IMPRESSIONS  1. Left ventricular ejection fraction, by estimation, is 30 to 35%. The left ventricle has moderately decreased function. The left ventricle demonstrates global hypokinesis. There is moderate left ventricular hypertrophy. Left ventricular diastolic parameters are consistent with Grade II diastolic dysfunction (pseudonormalization). Elevated left atrial pressure.  2. Right ventricular systolic function is normal. The right ventricular size is mildly enlarged.  3. Left atrial size was mildly dilated.  4. Right atrial size was mildly dilated.  5. The mitral valve is normal in structure. No evidence of mitral valve regurgitation. No evidence of mitral stenosis.  6. The aortic valve has an indeterminant number of cusps. Aortic valve regurgitation is not visualized. Mild aortic valve stenosis.  7. The inferior vena cava is normal in size with greater than 50% respiratory variability, suggesting right atrial pressure of 3 mmHg. FINDINGS  Left Ventricle: Left ventricular ejection fraction, by estimation, is 30 to 35%. The left ventricle has moderately decreased function. The left ventricle demonstrates global hypokinesis. The left ventricular internal cavity size was normal in size. There is moderate left ventricular hypertrophy. Left ventricular diastolic parameters are consistent with Grade II diastolic dysfunction (pseudonormalization). Elevated left atrial pressure. Right  Ventricle: The right ventricular size is mildly enlarged. Right ventricular systolic function is normal. Left Atrium: Left atrial size was mildly dilated. Right Atrium: Right atrial size was mildly  dilated. Pericardium: There is no evidence of pericardial effusion. Mitral Valve: The mitral valve is normal in structure. No evidence of mitral valve regurgitation. No evidence of mitral valve stenosis. Tricuspid Valve: The tricuspid valve is normal in structure. Tricuspid valve regurgitation is trivial. No evidence of tricuspid stenosis. Aortic Valve: The aortic valve has an indeterminant number of cusps. Aortic valve regurgitation is not visualized. Mild aortic stenosis is present. Aortic valve mean gradient measures 10.0 mmHg. Aortic valve peak gradient measures 16.7 mmHg. Aortic valve  area, by VTI measures 2.02 cm. Pulmonic Valve: The pulmonic valve was normal in structure. Pulmonic valve regurgitation is not visualized. No evidence of pulmonic stenosis. Aorta: The aortic root is normal in size and structure. Venous: The inferior vena cava is normal in size with greater than 50% respiratory variability, suggesting right atrial pressure of 3 mmHg. IAS/Shunts: No atrial level shunt detected by color flow Doppler.  LEFT VENTRICLE PLAX 2D LVIDd:         5.30 cm      Diastology LVIDs:         4.30 cm      LV e' medial:    4.13 cm/s LV PW:         1.50 cm      LV E/e' medial:  21.2 LV IVS:        1.10 cm      LV e' lateral:   3.48 cm/s LVOT diam:     2.10 cm      LV E/e' lateral: 25.1 LV SV:         65 LV SV Index:   31 LVOT Area:     3.46 cm  LV Volumes (MOD) LV vol d, MOD A2C: 104.0 ml LV vol d, MOD A4C: 112.0 ml LV vol s, MOD A2C: 72.8 ml LV vol s, MOD A4C: 64.0 ml LV SV MOD A2C:     31.2 ml LV SV MOD A4C:     112.0 ml LV SV MOD BP:      39.8 ml RIGHT VENTRICLE RV S prime:     8.92 cm/s TAPSE (M-mode): 2.3 cm LEFT ATRIUM             Index        RIGHT ATRIUM           Index LA diam:        4.40 cm 2.09 cm/m   RA  Area:     22.30 cm LA Vol (A2C):   48.6 ml 23.12 ml/m  RA Volume:   68.30 ml  32.49 ml/m LA Vol (A4C):   76.3 ml 36.29 ml/m LA Biplane Vol: 62.5 ml 29.73 ml/m  AORTIC VALVE AV Area (Vmax):    1.83 cm AV Area (Vmean):   2.01 cm AV Area (VTI):     2.02 cm AV Vmax:           204.50 cm/s AV Vmean:          142.500 cm/s AV VTI:            0.324 m AV Peak Grad:      16.7 mmHg AV Mean Grad:      10.0 mmHg LVOT Vmax:         108.00 cm/s LVOT Vmean:        82.600 cm/s LVOT VTI:          0.189 m LVOT/AV VTI ratio: 0.58  AORTA Ao Root diam: 2.80 cm Ao Asc diam:  3.30 cm MITRAL VALVE MV Area (PHT): 3.46 cm    SHUNTS MV Decel Time: 219 msec    Systemic VTI:  0.19 m MV E velocity: 87.40 cm/s  Systemic Diam: 2.10 cm MV A velocity: 57.80 cm/s MV E/A ratio:  1.51 Kirk Ruths MD Electronically signed by Kirk Ruths MD Signature Date/Time: 04/04/2021/2:36:04 PM    Final     Kathie Dike, MD  [facility]  If 7PM-7AM, please contact night-coverage [email or web address]  04/07/2021, 10:13 PM   LOS: 4 days

## 2021-04-07 NOTE — Progress Notes (Signed)
Patient called to nurses station asking "Where the hell is my sleeping pill?" Patient yelling over call bell. This nurse told the patient that ill be right there. Upon entering room, I can hear patient yelling in the room by herself. When walking in room patient very upset about how her sleeping pill was not scheduled. Patient stated yesterday her nurse brought her the sleeping pill without asking and now she is confused why she is supposed to ask for it. Patient continuing to verbalize how she hated night shift staff and "What the fuck else would yall be doing at night? I called for crackers at nine and didn't get them for an hour. They couldn't bring me nothing to drink until I took my meds. Nurses lying saying there ain't no drinks in there" Verbalized to patient that all we had in the fridge to drink on all units being water and protein shakes. I apologized for the inconvenience and stated that I would request for directors to speak with patient today. Supervisor aware.  Patient stated she didn't ask for sleeping pill cause she never saw anyone after the first med pass. I personally went over patients medication list, what she received last night, and what is listed PRN. Notified Dr. Josephine Cables to see if patient can get the something for sleep. Patient stated she cant sleep more than a couple hours at a time. Trazadone 25mg  PO ordered once and administered. Patient provided bathing supplies, water and ice. Patient stated she was going to sleep and she didn't need anything else but sleep. Lights turned down and door closed for patient to rest. Call bell within reach. Encouraged to call if she needed anything.

## 2021-04-08 DIAGNOSIS — E871 Hypo-osmolality and hyponatremia: Secondary | ICD-10-CM | POA: Diagnosis not present

## 2021-04-08 DIAGNOSIS — J101 Influenza due to other identified influenza virus with other respiratory manifestations: Secondary | ICD-10-CM | POA: Diagnosis not present

## 2021-04-08 DIAGNOSIS — I5043 Acute on chronic combined systolic (congestive) and diastolic (congestive) heart failure: Secondary | ICD-10-CM | POA: Diagnosis not present

## 2021-04-08 DIAGNOSIS — I160 Hypertensive urgency: Secondary | ICD-10-CM

## 2021-04-08 LAB — BASIC METABOLIC PANEL
Anion gap: 8 (ref 5–15)
BUN: 31 mg/dL — ABNORMAL HIGH (ref 6–20)
CO2: 28 mmol/L (ref 22–32)
Calcium: 8.8 mg/dL — ABNORMAL LOW (ref 8.9–10.3)
Chloride: 101 mmol/L (ref 98–111)
Creatinine, Ser: 1.07 mg/dL — ABNORMAL HIGH (ref 0.44–1.00)
GFR, Estimated: >60 mL/min (ref >60)
Glucose, Bld: 198 mg/dL — ABNORMAL HIGH (ref 70–99)
Potassium: 4 mmol/L (ref 3.5–5.1)
Sodium: 137 mmol/L (ref 135–145)

## 2021-04-08 LAB — GLUCOSE, CAPILLARY
Glucose-Capillary: 134 mg/dL — ABNORMAL HIGH (ref 70–99)
Glucose-Capillary: 123 mg/dL — ABNORMAL HIGH (ref 70–99)
Glucose-Capillary: 211 mg/dL — ABNORMAL HIGH (ref 70–99)
Glucose-Capillary: 210 mg/dL — ABNORMAL HIGH (ref 70–99)

## 2021-04-08 LAB — CBC
HCT: 38.9 % (ref 36.0–46.0)
Hemoglobin: 11.7 g/dL — ABNORMAL LOW (ref 12.0–15.0)
MCH: 22.2 pg — ABNORMAL LOW (ref 26.0–34.0)
MCHC: 30.1 g/dL (ref 30.0–36.0)
MCV: 74 fL — ABNORMAL LOW (ref 80.0–100.0)
Platelets: 230 10*3/uL (ref 150–400)
RBC: 5.26 MIL/uL — ABNORMAL HIGH (ref 3.87–5.11)
RDW: 16.8 % — ABNORMAL HIGH (ref 11.5–15.5)
WBC: 8.8 10*3/uL (ref 4.0–10.5)
nRBC: 0 % (ref 0.0–0.2)

## 2021-04-08 MED ORDER — ARFORMOTEROL TARTRATE 15 MCG/2ML IN NEBU
15.0000 ug | INHALATION_SOLUTION | Freq: Two times a day (BID) | RESPIRATORY_TRACT | Status: DC
  Administered 2021-04-08: 15 ug via RESPIRATORY_TRACT
  Filled 2021-04-08: qty 2

## 2021-04-08 MED ORDER — TORSEMIDE 20 MG PO TABS
40.0000 mg | ORAL_TABLET | Freq: Every day | ORAL | Status: DC
  Administered 2021-04-08 – 2021-04-09 (×2): 40 mg via ORAL
  Filled 2021-04-08 (×2): qty 2

## 2021-04-08 MED ORDER — BUDESONIDE 0.5 MG/2ML IN SUSP
0.5000 mg | Freq: Two times a day (BID) | RESPIRATORY_TRACT | Status: DC
  Administered 2021-04-08: 0.5 mg via RESPIRATORY_TRACT
  Filled 2021-04-08: qty 2

## 2021-04-08 NOTE — Progress Notes (Signed)
PROGRESS NOTE  Chelsey Santiago ZJQ:[id number] DOB: 12-Dec-1975 DOA: 04/02/2021 PCP: Rosita Fire, MD  Brief History:  45 year old female with a history of systolic and diastolic CHF, hypertensive cardiomyopathy, diabetes mellitus type 2, anxiety, OSA, iron deficiency anemia presenting with 1 week history of shortness of breath, myalgias, generalized weakness, and nonproductive cough.  She has had some subjective fevers and chills.  She also had some nausea and vomiting on the day of admission without any hematemesis, hematochezia, melena.  She describes some intermittent chest discomfort and epigastric discomfort although she has not able to clarify the duration, intensity, or variable factors. Notably, the patient recently saw cardiology on 02/20/2021.  It was noted that the patient had a 10 pound weight gain since 02/02/2021.  The patient was started on Farxiga at that time.  She was instructed to continue torsemide 40 twice daily and weigh herself daily.  The patient has not been weighing herself. In the emergency department, the patient had low-grade temperature 99.4 F.  She was mildly tachycardic with blood pressure up to 201/130.  Chest x-ray showed increasing vascular congestion.  BMP showed sodium 133, potassium 3.2, CO2 28, serum creatinine 1.27.  WBC 8.7, hemoglobin 11.5, platelets 252,000.  Influenza was positive.  Patient was started on oseltamavir and IV furosemide.   Assessment/Plan: Acute on chronic systolic and diastolic CHF -She does not appear to be massively fluid overloaded on exam -I feel that her interstitial markings on chest x-ray are more reflective of the patient's influenza with pulmonary manifestations -Patient given IV furosemide in the ED -Echocardiogram updated and shows that EF remains 30 to 35% -Continue carvedilol, Farxiga, spironolactone, Entresto -restart po torsemide   Influenza A with pulmonary manifestations -Continue oseltamivir--finished 5  days -Continue pulmonary hygiene -she is on bronchodilators and inhaled steroids -She was started on systemic steroids for wheezing, but blood sugars become significantly elevated -steroids discontinued -started on mucomyst nebs for persistent cough and to help bring up sputum -recheck chest xray since cough has persisted   Hypertensive urgency -Continue hydralazine, Imdur, carvedilol, spironolactone, amlodipine, Imdur   Uncontrolled diabetes mellitus type 2 with hyperglycemia -Currently on basal insulin -NovoLog sliding scale -Hemoglobin A1c 9.5 -Blood sugars improving since steroids discontinued   Depression/anxiety -Restarted Abilify, clonazepam   Peripheral neuropathy -Restarted home dose of gabapentin   Atypical chest pain -Personally reviewed EKG--sinus rhythm, nonspecific ST-T wave change -Troponins have remained flat, unlikely ACS   Obesity class II -BMI 34.5 -Lifestyle modification   Hypokalemia/hypomagnesemia -Replaced       Status is: Inpatient   Remains inpatient appropriate because: severity of illness requiring IV lasix               Family Communication:  no Family at bedside   Consultants:  none   Code Status:  FULL    DVT Prophylaxis:  [place] Lovenox     Procedures: As Listed in Progress Note Above   Antibiotics: None    Subjective: Patient is breathing better.  Denies f/c, cp, n/v/d, abd pain  Objective: Vitals:   04/08/21 0544 04/08/21 0825 04/08/21 1430 04/08/21 1537  BP: 125/81  122/79   Pulse: 77  76   Resp: 18  18   Temp: 98.6 F (37 C)  98.7 F (37.1 C)   TempSrc: Oral  Oral   SpO2: 96% 96% 97% 96%  Weight:      Height:        Intake/Output Summary (Last 24  hours) at 04/08/2021 1655 Last data filed at 04/08/2021 1500 Gross per 24 hour  Intake 1480 ml  Output 1200 ml  Net 280 ml   Weight change: 2.374 kg Exam:  General:  Pt is alert, follows commands appropriately, not in acute distress HEENT: No icterus,  No thrush, No neck mass, /AT Cardiovascular: RRR, S1/S2, no rubs, no gallops Respiratory: bibasilar rales.  Mild upper airway wheeze Abdomen: Soft/+BS, non tender, non distended, no guarding Extremities: trace LE edema, No lymphangitis, No petechiae, No rashes, no synovitis   Data Reviewed: I have personally reviewed following labs and imaging studies Basic Metabolic Panel: Recent Labs  Lab 04/03/21 1907 04/04/21 0631 04/05/21 0511 04/06/21 0347 04/06/21 1149 04/07/21 0708 04/08/21 0523  NA  --  134* 138 133*  --  134* 137  K  --  2.7* 3.9 3.8  --  3.9 4.0  CL  --  93* 97* 93*  --  96* 101  CO2  --  31 30 29   --  29 28  GLUCOSE  --  142* 208* 323* 495* 235* 198*  BUN  --  16 14 23*  --  32* 31*  CREATININE  --  0.89 1.02* 1.26*  --  1.20* 1.07*  CALCIUM  --  7.6* 8.3* 8.7*  --  8.8* 8.8*  MG 1.4* 1.4* 2.3  --   --   --   --   PHOS 2.9  --   --   --   --   --   --    Liver Function Tests: Recent Labs  Lab 04/04/21 0631  AST 22  ALT 11  ALKPHOS 65  BILITOT 0.8  PROT 6.8  ALBUMIN 2.9*   No results for input(s): LIPASE, AMYLASE in the last 168 hours. No results for input(s): AMMONIA in the last 168 hours. Coagulation Profile: Recent Labs  Lab 04/04/21 0631  INR 1.2   CBC: Recent Labs  Lab 04/03/21 0051 04/04/21 0631 04/08/21 0523  WBC 8.7 5.4 8.8  NEUTROABS 6.3  --   --   HGB 11.5* 11.8* 11.7*  HCT 36.8 38.0 38.9  MCV 74.0* 73.4* 74.0*  PLT 252 228 230   Cardiac Enzymes: No results for input(s): CKTOTAL, CKMB, CKMBINDEX, TROPONINI in the last 168 hours. BNP: Invalid input(s): POCBNP CBG: Recent Labs  Lab 04/07/21 1611 04/07/21 2100 04/08/21 0813 04/08/21 1104 04/08/21 1623  GLUCAP 178* 204* 134* 123* 211*   HbA1C: No results for input(s): HGBA1C in the last 72 hours. Urine analysis:    Component Value Date/Time   COLORURINE YELLOW 08/28/2019 1226   APPEARANCEUR CLEAR 08/28/2019 1226   LABSPEC 1.028 08/28/2019 1226   PHURINE 6.0  08/28/2019 1226   GLUCOSEU >=500 (A) 08/28/2019 1226   HGBUR NEGATIVE 08/28/2019 1226   BILIRUBINUR NEGATIVE 08/28/2019 1226   KETONESUR NEGATIVE 08/28/2019 1226   PROTEINUR Negative 12/19/2019 1010   PROTEINUR NEGATIVE 08/28/2019 1226   UROBILINOGEN 0.2 04/29/2014 1115   NITRITE n 12/19/2019 1010   NITRITE NEGATIVE 08/28/2019 1226   LEUKOCYTESUR Negative 12/19/2019 1010   LEUKOCYTESUR TRACE (A) 08/28/2019 1226   Sepsis Labs: @LABRCNTIP (procalcitonin:4,lacticidven:4) ) Recent Results (from the past 240 hour(s))  Resp Panel by RT-PCR (Flu A&B, Covid) Nasopharyngeal Swab     Status: Abnormal   Collection Time: 04/03/21 12:45 AM   Specimen: Nasopharyngeal Swab; Nasopharyngeal(NP) swabs in vial transport medium  Result Value Ref Range Status   SARS Coronavirus 2 by RT PCR NEGATIVE NEGATIVE Final    Comment: (  NOTE) SARS-CoV-2 target nucleic acids are NOT DETECTED.  The SARS-CoV-2 RNA is generally detectable in upper respiratory specimens during the acute phase of infection. The lowest concentration of SARS-CoV-2 viral copies this assay can detect is 138 copies/mL. A negative result does not preclude SARS-Cov-2 infection and should not be used as the sole basis for treatment or other patient management decisions. A negative result may occur with  improper specimen collection/handling, submission of specimen other than nasopharyngeal swab, presence of viral mutation(s) within the areas targeted by this assay, and inadequate number of viral copies(<138 copies/mL). A negative result must be combined with clinical observations, patient history, and epidemiological information. The expected result is Negative.  Fact Sheet for Patients:  EntrepreneurPulse.com.au  Fact Sheet for Healthcare Providers:  IncredibleEmployment.be  This test is no t yet approved or cleared by the [place] FDA and  has been authorized for detection and/or diagnosis of  SARS-CoV-2 by FDA under an Emergency Use Authorization (EUA). This EUA will remain  in effect (meaning this test can be used) for the duration of the COVID-19 declaration under Section 564(b)(1) of the Act, 21 U.S.C.section 360bbb-3(b)(1), unless the authorization is terminated  or revoked sooner.       Influenza A by PCR POSITIVE (A) NEGATIVE Final   Influenza B by PCR NEGATIVE NEGATIVE Final    Comment: (NOTE) The Xpert Xpress SARS-CoV-2/FLU/RSV plus assay is intended as an aid in the diagnosis of influenza from Nasopharyngeal swab specimens and should not be used as a sole basis for treatment. Nasal washings and aspirates are unacceptable for Xpert Xpress SARS-CoV-2/FLU/RSV testing.  Fact Sheet for Patients: EntrepreneurPulse.com.au  Fact Sheet for Healthcare Providers: IncredibleEmployment.be  This test is not yet approved or cleared by the [place] FDA and has been authorized for detection and/or diagnosis of SARS-CoV-2 by FDA under an Emergency Use Authorization (EUA). This EUA will remain in effect (meaning this test can be used) for the duration of the COVID-19 declaration under Section 564(b)(1) of the Act, 21 U.S.C. section 360bbb-3(b)(1), unless the authorization is terminated or revoked.  Performed at [facility], [address], [place], [place] [address]      Scheduled Meds:  acetylcysteine  4 mL Oral TID   amLODipine  10 mg Oral Daily   arformoterol  15 mcg Nebulization BID   aspirin EC  81 mg Oral Daily   benzonatate  200 mg Oral TID   budesonide (PULMICORT) nebulizer solution  0.5 mg Nebulization BID   carvedilol  25 mg Oral BID WC   clonazePAM  0.5 mg Oral BID   dapagliflozin propanediol  10 mg Oral QAC breakfast   dextromethorphan-guaiFENesin  1 tablet Oral BID   enoxaparin (LOVENOX) injection  40 mg Subcutaneous Q24H   gabapentin  800 mg Oral TID   hydrALAZINE  100 mg Oral TID   insulin aspart  0-15 Units  Subcutaneous TID WC   insulin aspart  0-5 Units Subcutaneous QHS   insulin aspart  5 Units Subcutaneous TID WC   insulin glargine-yfgn  40 Units Subcutaneous QHS   ipratropium-albuterol  3 mL Nebulization TID   isosorbide mononitrate  30 mg Oral Daily   sacubitril-valsartan  1 tablet Oral BID   sodium chloride flush  3 mL Intravenous Q12H   torsemide  40 mg Oral Daily   Continuous Infusions:  sodium chloride     sodium chloride Stopped (04/03/21 1116)    Procedures/Studies: DG CHEST PORT 1 VIEW  Result Date: 04/08/2021 CLINICAL DATA:  Shortness of breath for 1 week EXAM: PORTABLE CHEST 1 VIEW COMPARISON:  04/03/2021 FINDINGS: Cardiac shadow is mildly enlarged. Lungs are clear bilaterally. No bony abnormality is noted. IMPRESSION: No active disease. Electronically Signed   By: Inez Catalina M.D.   On: 04/08/2021 01:06   DG Chest Port 1 View  Result Date: 04/03/2021 CLINICAL DATA:  Cough. EXAM: PORTABLE CHEST 1 VIEW COMPARISON:  Chest radiograph dated 03/27/2021. FINDINGS: Cardiomegaly with mild central vascular congestion. No focal consolidation, pleural effusion, or pneumothorax. No acute osseous pathology. IMPRESSION: Cardiomegaly with mild central vascular congestion. No focal consolidation. Electronically Signed   By: Anner Crete M.D.   On: 04/03/2021 01:37   DG Chest Portable 1 View  Result Date: 03/27/2021 CLINICAL DATA:  Shortness of breath, cough EXAM: PORTABLE CHEST 1 VIEW COMPARISON:  Previous studies including the examination of 02/02/2021 FINDINGS: Transverse diameter of heart is increased. There are no signs of pulmonary edema or focal pulmonary consolidation. There is no pleural effusion or pneumothorax. IMPRESSION: Cardiomegaly. There are no signs of pulmonary edema or focal pulmonary consolidation. Electronically Signed   By: Elmer Picker M.D.   On: 03/27/2021 08:09   ECHOCARDIOGRAM COMPLETE  Result Date: 04/04/2021    ECHOCARDIOGRAM REPORT   Patient Name:    MARIELIS SAMARA Date of Exam: 04/04/2021 Medical Rec #:  [id number]       Height:       66.0 in Accession #:    [id number]      Weight:       225.0 lb Date of Birth:  [birth year]       BSA:          2.102 m Patient Age:    11 years        BP:           177/108 mmHg Patient Gender: F               HR:           76 bpm. Exam Location:  Inpatient Procedure: 2D Echo, Color Doppler and Cardiac Doppler Indications:    T55.73 Acute systolic (congestive) heart failure  History:        Patient has prior history of Echocardiogram examinations, most                 recent 03/28/2020. CHF; Risk Factors:Hypertension and Sleep                 Apnea.  Sonographer:    Raquel Sarna Senior RDCS Referring Phys: [id number] Laterra Lubinski IMPRESSIONS  1. Left ventricular ejection fraction, by estimation, is 30 to 35%. The left ventricle has moderately decreased function. The left ventricle demonstrates global hypokinesis. There is moderate left ventricular hypertrophy. Left ventricular diastolic parameters are consistent with Grade II diastolic dysfunction (pseudonormalization). Elevated left atrial pressure.  2. Right ventricular systolic function is normal. The right ventricular size is mildly enlarged.  3. Left atrial size was mildly dilated.  4. Right atrial size was mildly dilated.  5. The mitral valve is normal in structure. No evidence of mitral valve regurgitation. No evidence of mitral stenosis.  6. The aortic valve has an indeterminant number of cusps. Aortic valve regurgitation is not visualized. Mild aortic valve stenosis.  7. The inferior vena cava is normal in size with greater than 50% respiratory variability, suggesting right atrial pressure of 3 mmHg. FINDINGS  Left Ventricle: Left ventricular ejection fraction, by estimation, is 30 to 35%. The left ventricle has moderately decreased  function. The left ventricle demonstrates global hypokinesis. The left ventricular internal cavity size was normal in size. There is moderate left  ventricular hypertrophy. Left ventricular diastolic parameters are consistent with Grade II diastolic dysfunction (pseudonormalization). Elevated left atrial pressure. Right Ventricle: The right ventricular size is mildly enlarged. Right ventricular systolic function is normal. Left Atrium: Left atrial size was mildly dilated. Right Atrium: Right atrial size was mildly dilated. Pericardium: There is no evidence of pericardial effusion. Mitral Valve: The mitral valve is normal in structure. No evidence of mitral valve regurgitation. No evidence of mitral valve stenosis. Tricuspid Valve: The tricuspid valve is normal in structure. Tricuspid valve regurgitation is trivial. No evidence of tricuspid stenosis. Aortic Valve: The aortic valve has an indeterminant number of cusps. Aortic valve regurgitation is not visualized. Mild aortic stenosis is present. Aortic valve mean gradient measures 10.0 mmHg. Aortic valve peak gradient measures 16.7 mmHg. Aortic valve  area, by VTI measures 2.02 cm. Pulmonic Valve: The pulmonic valve was normal in structure. Pulmonic valve regurgitation is not visualized. No evidence of pulmonic stenosis. Aorta: The aortic root is normal in size and structure. Venous: The inferior vena cava is normal in size with greater than 50% respiratory variability, suggesting right atrial pressure of 3 mmHg. IAS/Shunts: No atrial level shunt detected by color flow Doppler.  LEFT VENTRICLE PLAX 2D LVIDd:         5.30 cm      Diastology LVIDs:         4.30 cm      LV e' medial:    4.13 cm/s LV PW:         1.50 cm      LV E/e' medial:  21.2 LV IVS:        1.10 cm      LV e' lateral:   3.48 cm/s LVOT diam:     2.10 cm      LV E/e' lateral: 25.1 LV SV:         65 LV SV Index:   31 LVOT Area:     3.46 cm  LV Volumes (MOD) LV vol d, MOD A2C: 104.0 ml LV vol d, MOD A4C: 112.0 ml LV vol s, MOD A2C: 72.8 ml LV vol s, MOD A4C: 64.0 ml LV SV MOD A2C:     31.2 ml LV SV MOD A4C:     112.0 ml LV SV MOD BP:      39.8 ml  RIGHT VENTRICLE RV S prime:     8.92 cm/s TAPSE (M-mode): 2.3 cm LEFT ATRIUM             Index        RIGHT ATRIUM           Index LA diam:        4.40 cm 2.09 cm/m   RA Area:     22.30 cm LA Vol (A2C):   48.6 ml 23.12 ml/m  RA Volume:   68.30 ml  32.49 ml/m LA Vol (A4C):   76.3 ml 36.29 ml/m LA Biplane Vol: 62.5 ml 29.73 ml/m  AORTIC VALVE AV Area (Vmax):    1.83 cm AV Area (Vmean):   2.01 cm AV Area (VTI):     2.02 cm AV Vmax:           204.50 cm/s AV Vmean:          142.500 cm/s AV VTI:            0.324 m  AV Peak Grad:      16.7 mmHg AV Mean Grad:      10.0 mmHg LVOT Vmax:         108.00 cm/s LVOT Vmean:        82.600 cm/s LVOT VTI:          0.189 m LVOT/AV VTI ratio: 0.58  AORTA Ao Root diam: 2.80 cm Ao Asc diam:  3.30 cm MITRAL VALVE MV Area (PHT): 3.46 cm    SHUNTS MV Decel Time: 219 msec    Systemic VTI:  0.19 m MV E velocity: 87.40 cm/s  Systemic Diam: 2.10 cm MV A velocity: 57.80 cm/s MV E/A ratio:  1.51 Kirk Ruths MD Electronically signed by Kirk Ruths MD Signature Date/Time: 04/04/2021/2:36:04 PM    Final     Orson Eva, DO  [facility]  If 7PM-7AM, please contact night-coverage [email or web address] Password [email or web address] 04/08/2021, 4:55 PM   LOS: 5 days

## 2021-04-08 NOTE — Plan of Care (Signed)

## 2021-04-09 DIAGNOSIS — I160 Hypertensive urgency: Secondary | ICD-10-CM | POA: Diagnosis not present

## 2021-04-09 DIAGNOSIS — E876 Hypokalemia: Secondary | ICD-10-CM

## 2021-04-09 DIAGNOSIS — J101 Influenza due to other identified influenza virus with other respiratory manifestations: Secondary | ICD-10-CM | POA: Diagnosis not present

## 2021-04-09 DIAGNOSIS — E669 Obesity, unspecified: Secondary | ICD-10-CM

## 2021-04-09 DIAGNOSIS — I5043 Acute on chronic combined systolic (congestive) and diastolic (congestive) heart failure: Secondary | ICD-10-CM | POA: Diagnosis not present

## 2021-04-09 LAB — MAGNESIUM: Magnesium: 2.1 mg/dL (ref 1.7–2.4)

## 2021-04-09 LAB — BASIC METABOLIC PANEL
Anion gap: 6 (ref 5–15)
BUN: 34 mg/dL — ABNORMAL HIGH (ref 6–20)
CO2: 29 mmol/L (ref 22–32)
Calcium: 8.6 mg/dL — ABNORMAL LOW (ref 8.9–10.3)
Chloride: 99 mmol/L (ref 98–111)
Creatinine, Ser: 1.24 mg/dL — ABNORMAL HIGH (ref 0.44–1.00)
GFR, Estimated: 55 mL/min — ABNORMAL LOW (ref >60)
Glucose, Bld: 199 mg/dL — ABNORMAL HIGH (ref 70–99)
Potassium: 4.1 mmol/L (ref 3.5–5.1)
Sodium: 134 mmol/L — ABNORMAL LOW (ref 135–145)

## 2021-04-09 LAB — GLUCOSE, CAPILLARY
Glucose-Capillary: 228 mg/dL — ABNORMAL HIGH (ref 70–99)
Glucose-Capillary: 111 mg/dL — ABNORMAL HIGH (ref 70–99)

## 2021-04-09 MED ORDER — IPRATROPIUM-ALBUTEROL 0.5-2.5 (3) MG/3ML IN SOLN
3.0000 mL | Freq: Three times a day (TID) | RESPIRATORY_TRACT | 1 refills | Status: AC
Start: 2021-04-09 — End: ?

## 2021-04-09 NOTE — TOC Transition Note (Signed)
Transition of Care [facility]) - CM/SW Discharge Note   Patient Details  Name: Dove Gresham MRN: [id number] Date of Birth: 1975-12-13  [facility]) CM/SW Contact:  Boneta Lucks, RN Phone Number: 04/09/2021, 12:16 PM   Clinical Narrative:  Patient discharging home. MD is ordering. [employer] accepted the referral and will drop ship the machine.   Final next level of care: Home/Self Care Barriers to Discharge: Barriers Resolved  Patient Goals and CMS Choice Patient states their goals for this hospitalization and ongoing recovery are:: to go home. CMS Medicare.gov Compare Post Acute Care list provided to:: Patient    Discharge Placement      Patient and family notified of of transfer: 04/09/21  Discharge Plan and Services           Readmission Risk Interventions Readmission Risk Prevention Plan 04/03/2021 10/13/2020 10/08/2020  Transportation Screening Complete - Complete  HRI or [place] for [place] - - -  Medication Review Press photographer) Complete - Complete  PCP or Specialist appointment within 3-5 days of discharge - Not Complete -  [place] or Home Care Consult Complete - Complete  SW Recovery Care/Counseling Consult - - Complete  [place] - - Not Applicable  Some recent data might be hidden

## 2021-04-09 NOTE — Discharge Summary (Signed)
Physician Discharge Summary  Chelsey Santiago ZHG:[id number] DOB: [birth year] DOA: 04/02/2021  PCP: Rosita Fire, MD  Admit date: 04/02/2021 Discharge date: 04/09/2021  Admitted From: Home Disposition:  Home   Recommendations for Outpatient Follow-up:  Follow up with PCP in 1-2 weeks Please obtain BMP/CBC in one week     Discharge Condition: Stable CODE STATUS: FULL Diet recommendation: Heart Healthy / Carb Modified    Brief/Interim Summary: 45 year old female with a history of systolic and diastolic CHF, hypertensive cardiomyopathy, diabetes mellitus type 2, anxiety, OSA, iron deficiency anemia presenting with 1 week history of shortness of breath, myalgias, generalized weakness, and nonproductive cough.  She has had some subjective fevers and chills.  She also had some nausea and vomiting on the day of admission without any hematemesis, hematochezia, melena.  She describes some intermittent chest discomfort and epigastric discomfort although she has not able to clarify the duration, intensity, or variable factors. Notably, the patient recently saw cardiology on 02/20/2021.  It was noted that the patient had a 10 pound weight gain since 02/02/2021.  The patient was started on Farxiga at that time.  She was instructed to continue torsemide 40 twice daily and weigh herself daily.  The patient has not been weighing herself. In the emergency department, the patient had low-grade temperature 99.4 F.  She was mildly tachycardic with blood pressure up to 201/130.  Chest x-ray showed increasing vascular congestion.  BMP showed sodium 133, potassium 3.2, CO2 28, serum creatinine 1.27.  WBC 8.7, hemoglobin 11.5, platelets 252,000.  Influenza was positive.  Patient was started on oseltamavir and IV furosemide.  Her serum creatinine rose and her IV furosemide was held and she remained stable.  She was restarted on home torsemide and she remained stable.  Her other cardiac meds including coreg and  Entresto were restarted with improvement in her BPs.  She completed 5 days oseltamivir in the hospital.  She  was treated with bronchodilators.  Her sob slowly improved.  She was able to ambulate without difficulty for 2 days prior to d/c.  With ambulation, she did not have any oxygen desaturation.  Discharge Diagnoses:  Acute on chronic systolic and diastolic CHF -She does not appear to be massively fluid overloaded on exam -I feel that her interstitial markings on chest x-ray are more reflective of the patient's influenza with pulmonary manifestations -Patient given IV furosemide  -Echocardiogram updated and shows that EF remains 30 to 35% -Continue carvedilol, Farxiga, spironolactone, Entresto -restarted po torsemide>>serum creatinine remained stable   Influenza A with pulmonary manifestations -Continue oseltamivir--finished 5 days -Continue pulmonary hygiene -she is on bronchodilators and inhaled steroids -She was started on systemic steroids for wheezing, but blood sugars become significantly elevated -steroids discontinued -started on mucomyst nebs for persistent cough and to help bring up sputum -11/23 recheck chest xray since cough has persisted--no infiltrates or edema -ambulated on RA on day of d/c without any oxygen desaturation--->98-99% on RA   Hypertensive urgency -Continue hydralazine, Imdur, carvedilol, spironolactone, amlodipine -improved and well controlled -suspect a degree of poor compliance at home   Uncontrolled diabetes mellitus type 2 with hyperglycemia -Currently on basal insulin -NovoLog sliding scale -Hemoglobin A1c 9.5 -Blood sugars improving since steroids discontinued   Depression/anxiety -Restarted Abilify, clonazepam   Peripheral neuropathy -Restarted home dose of gabapentin   Atypical chest pain -Personally reviewed EKG--sinus rhythm, nonspecific ST-T wave change -Troponins have remained flat, unlikely ACS   Obesity class II -BMI  34.5 -Lifestyle modification   Hypokalemia/hypomagnesemia -Replaced  Discharge Instructions   Allergies as of 04/09/2021       Reactions   Diclofenac Swelling   AND POSSIBLE SYNCOPE; tolerates ibuprofen per pt   Tramadol Nausea And Vomiting, Nausea Only   Itching (12/21); tolerates ibuprofen per pt   Vicodin [hydrocodone-acetaminophen] Itching, Nausea Only        Medication List     STOP taking these medications    benzonatate 100 MG capsule Commonly known as: TESSALON   nitroGLYCERIN 0.4 MG SL tablet Commonly known as: Nitrostat   ondansetron 4 MG disintegrating tablet Commonly known as: ZOFRAN-ODT   polyethylene glycol-electrolytes 420 g solution Commonly known as: NuLYTELY       TAKE these medications    Accu-Chek Guide test strip Generic drug: glucose blood USE AS DIRECTED TO TESTCBLOOD SUGAR TWICE DAILY.   albuterol 108 (90 Base) MCG/ACT inhaler Commonly known as: VENTOLIN HFA Inhale 1 puff into the lungs every 6 (six) hours as needed for wheezing or shortness of breath.   amLODipine 10 MG tablet Commonly known as: NORVASC Take 10 mg by mouth daily.   ARIPiprazole 10 MG tablet Commonly known as: ABILIFY Take 1 tablet (10 mg total) by mouth daily.   aspirin EC 81 MG tablet Take 81 mg by mouth daily.   budesonide-formoterol 80-4.5 MCG/ACT inhaler Commonly known as: Symbicort Inhale 2 puffs into the lungs 2 (two) times daily.   carvedilol 25 MG tablet Commonly known as: COREG Take 1 tablet (25 mg total) by mouth 2 (two) times daily with a meal.   clonazePAM 0.5 MG tablet Commonly known as: KLONOPIN Take 0.5 mg by mouth 2 (two) times daily.   dapagliflozin propanediol 10 MG Tabs tablet Commonly known as: Farxiga Take 1 tablet (10 mg total) by mouth daily before breakfast.   Entresto 97-103 MG Generic drug: sacubitril-valsartan Take 1 tablet by mouth 2 (two) times daily.   gabapentin 800 MG tablet Commonly known as: NEURONTIN Take  800 mg by mouth 3 (three) times daily.   hydrALAZINE 100 MG tablet Commonly known as: APRESOLINE Take 100 mg by mouth 3 (three) times daily.   insulin glargine 100 UNIT/ML injection Commonly known as: LANTUS Inject 0.35 mLs (35 Units total) into the skin at bedtime.   insulin lispro 100 UNIT/ML injection Commonly known as: HUMALOG Inject 10-16 Units into the skin 3 (three) times daily before meals.   Ipratropium-Albuterol 20-100 MCG/ACT Aers respimat Commonly known as: Combivent Respimat Inhale 1 puff into the lungs every 6 (six) hours as needed for wheezing or shortness of breath. What changed: Another medication with the same name was added. Make sure you understand how and when to take each.   ipratropium-albuterol 0.5-2.5 (3) MG/3ML Soln Commonly known as: DUONEB Take 3 mLs by nebulization 3 (three) times daily. What changed: You were already taking a medication with the same name, and this prescription was added. Make sure you understand how and when to take each.   isosorbide mononitrate 30 MG 24 hr tablet Commonly known as: IMDUR Take 1 tablet (30 mg total) by mouth daily.   liraglutide 18 MG/3ML Sopn Commonly known as: VICTOZA Inject 1.2 mg into the skin daily.   potassium chloride SA 20 MEQ tablet Commonly known as: KLOR-CON Take 20 mEq by mouth daily.   pyridostigmine 60 MG tablet Commonly known as: MESTINON Take 60 mg by mouth every 8 (eight) hours.   spironolactone 25 MG tablet Commonly known as: ALDACTONE Take 1 tablet (25 mg total) by mouth daily.  torsemide 20 MG tablet Commonly known as: DEMADEX Take 2 tablets (40 mg total) by mouth 2 (two) times daily.   traZODone 100 MG tablet Commonly known as: DESYREL Take 2 tablets (200 mg total) by mouth at bedtime.               Durable Medical Equipment  (From admission, onward)           Start     Ordered   04/09/21 1214  For home use only DME Nebulizer machine  Once       Question Answer  Comment  Patient needs a nebulizer to treat with the following condition Influenza A   Patient needs a nebulizer to treat with the following condition Obstructive sleep apnea of adult   Length of Need 6 Months      04/09/21 1215            [facility], [facility] Follow up.   Why: Nebulizer will be shipped Contact information: [address] [place] [address] [phone]                Allergies  Allergen Reactions   Diclofenac Swelling    AND POSSIBLE SYNCOPE; tolerates ibuprofen per pt   Tramadol Nausea And Vomiting and Nausea Only    Itching (12/21); tolerates ibuprofen per pt   Vicodin [Hydrocodone-Acetaminophen] Itching and Nausea Only    Consultations: none   Procedures/Studies: DG CHEST PORT 1 VIEW  Result Date: 04/08/2021 CLINICAL DATA:  Shortness of breath for 1 week EXAM: PORTABLE CHEST 1 VIEW COMPARISON:  04/03/2021 FINDINGS: Cardiac shadow is mildly enlarged. Lungs are clear bilaterally. No bony abnormality is noted. IMPRESSION: No active disease. Electronically Signed   By: Inez Catalina M.D.   On: 04/08/2021 01:06   DG Chest Port 1 View  Result Date: 04/03/2021 CLINICAL DATA:  Cough. EXAM: PORTABLE CHEST 1 VIEW COMPARISON:  Chest radiograph dated 03/27/2021. FINDINGS: Cardiomegaly with mild central vascular congestion. No focal consolidation, pleural effusion, or pneumothorax. No acute osseous pathology. IMPRESSION: Cardiomegaly with mild central vascular congestion. No focal consolidation. Electronically Signed   By: Anner Crete M.D.   On: 04/03/2021 01:37   DG Chest Portable 1 View  Result Date: 03/27/2021 CLINICAL DATA:  Shortness of breath, cough EXAM: PORTABLE CHEST 1 VIEW COMPARISON:  Previous studies including the examination of 02/02/2021 FINDINGS: Transverse diameter of heart is increased. There are no signs of pulmonary edema or focal pulmonary consolidation. There is no pleural effusion  or pneumothorax. IMPRESSION: Cardiomegaly. There are no signs of pulmonary edema or focal pulmonary consolidation. Electronically Signed   By: Elmer Picker M.D.   On: 03/27/2021 08:09   ECHOCARDIOGRAM COMPLETE  Result Date: 04/04/2021    ECHOCARDIOGRAM REPORT   Patient Name:   Chelsey Santiago Date of Exam: 04/04/2021 Medical Rec #:  [id number]       Height:       66.0 in Accession #:    [id number]      Weight:       225.0 lb Date of Birth:  05/24/1975       BSA:          2.102 m Patient Age:    45 years        BP:           177/108 mmHg Patient Gender: F               HR:  76 bpm. Exam Location:  Inpatient Procedure: 2D Echo, Color Doppler and Cardiac Doppler Indications:    M42.68 Acute systolic (congestive) heart failure  History:        Patient has prior history of Echocardiogram examinations, most                 recent 03/28/2020. CHF; Risk Factors:Hypertension and Sleep                 Apnea.  Sonographer:    Raquel Sarna Senior RDCS Referring Phys: [phone] Minetta Krisher IMPRESSIONS  1. Left ventricular ejection fraction, by estimation, is 30 to 35%. The left ventricle has moderately decreased function. The left ventricle demonstrates global hypokinesis. There is moderate left ventricular hypertrophy. Left ventricular diastolic parameters are consistent with Grade II diastolic dysfunction (pseudonormalization). Elevated left atrial pressure.  2. Right ventricular systolic function is normal. The right ventricular size is mildly enlarged.  3. Left atrial size was mildly dilated.  4. Right atrial size was mildly dilated.  5. The mitral valve is normal in structure. No evidence of mitral valve regurgitation. No evidence of mitral stenosis.  6. The aortic valve has an indeterminant number of cusps. Aortic valve regurgitation is not visualized. Mild aortic valve stenosis.  7. The inferior vena cava is normal in size with greater than 50% respiratory variability, suggesting right atrial pressure of 3 mmHg.  FINDINGS  Left Ventricle: Left ventricular ejection fraction, by estimation, is 30 to 35%. The left ventricle has moderately decreased function. The left ventricle demonstrates global hypokinesis. The left ventricular internal cavity size was normal in size. There is moderate left ventricular hypertrophy. Left ventricular diastolic parameters are consistent with Grade II diastolic dysfunction (pseudonormalization). Elevated left atrial pressure. Right Ventricle: The right ventricular size is mildly enlarged. Right ventricular systolic function is normal. Left Atrium: Left atrial size was mildly dilated. Right Atrium: Right atrial size was mildly dilated. Pericardium: There is no evidence of pericardial effusion. Mitral Valve: The mitral valve is normal in structure. No evidence of mitral valve regurgitation. No evidence of mitral valve stenosis. Tricuspid Valve: The tricuspid valve is normal in structure. Tricuspid valve regurgitation is trivial. No evidence of tricuspid stenosis. Aortic Valve: The aortic valve has an indeterminant number of cusps. Aortic valve regurgitation is not visualized. Mild aortic stenosis is present. Aortic valve mean gradient measures 10.0 mmHg. Aortic valve peak gradient measures 16.7 mmHg. Aortic valve  area, by VTI measures 2.02 cm. Pulmonic Valve: The pulmonic valve was normal in structure. Pulmonic valve regurgitation is not visualized. No evidence of pulmonic stenosis. Aorta: The aortic root is normal in size and structure. Venous: The inferior vena cava is normal in size with greater than 50% respiratory variability, suggesting right atrial pressure of 3 mmHg. IAS/Shunts: No atrial level shunt detected by color flow Doppler.  LEFT VENTRICLE PLAX 2D LVIDd:         5.30 cm      Diastology LVIDs:         4.30 cm      LV e' medial:    4.13 cm/s LV PW:         1.50 cm      LV E/e' medial:  21.2 LV IVS:        1.10 cm      LV e' lateral:   3.48 cm/s LVOT diam:     2.10 cm      LV E/e'  lateral: 25.1 LV SV:  65 LV SV Index:   31 LVOT Area:     3.46 cm  LV Volumes (MOD) LV vol d, MOD A2C: 104.0 ml LV vol d, MOD A4C: 112.0 ml LV vol s, MOD A2C: 72.8 ml LV vol s, MOD A4C: 64.0 ml LV SV MOD A2C:     31.2 ml LV SV MOD A4C:     112.0 ml LV SV MOD BP:      39.8 ml RIGHT VENTRICLE RV S prime:     8.92 cm/s TAPSE (M-mode): 2.3 cm LEFT ATRIUM             Index        RIGHT ATRIUM           Index LA diam:        4.40 cm 2.09 cm/m   RA Area:     22.30 cm LA Vol (A2C):   48.6 ml 23.12 ml/m  RA Volume:   68.30 ml  32.49 ml/m LA Vol (A4C):   76.3 ml 36.29 ml/m LA Biplane Vol: 62.5 ml 29.73 ml/m  AORTIC VALVE AV Area (Vmax):    1.83 cm AV Area (Vmean):   2.01 cm AV Area (VTI):     2.02 cm AV Vmax:           204.50 cm/s AV Vmean:          142.500 cm/s AV VTI:            0.324 m AV Peak Grad:      16.7 mmHg AV Mean Grad:      10.0 mmHg LVOT Vmax:         108.00 cm/s LVOT Vmean:        82.600 cm/s LVOT VTI:          0.189 m LVOT/AV VTI ratio: 0.58  AORTA Ao Root diam: 2.80 cm Ao Asc diam:  3.30 cm MITRAL VALVE MV Area (PHT): 3.46 cm    SHUNTS MV Decel Time: 219 msec    Systemic VTI:  0.19 m MV E velocity: 87.40 cm/s  Systemic Diam: 2.10 cm MV A velocity: 57.80 cm/s MV E/A ratio:  1.51 Kirk Ruths MD Electronically signed by Kirk Ruths MD Signature Date/Time: 04/04/2021/2:36:04 PM    Final         Discharge Exam: Vitals:   04/09/21 0432 04/09/21 0728  BP: 112/72   Pulse: 80   Resp: 20   Temp: 98.3 F (36.8 C)   SpO2: 94% 94%   Vitals:   04/08/21 2016 04/09/21 0432 04/09/21 0500 04/09/21 0728  BP: 108/60 112/72    Pulse: 83 80    Resp: 20 20    Temp: 98.4 F (36.9 C) 98.3 F (36.8 C)    TempSrc:      SpO2: 93% 94%  94%  Weight:   102.9 kg   Height:        General: Pt is alert, awake, not in acute distress Cardiovascular: RRR, S1/S2 +, no rubs, no gallops Respiratory: scattered rales. No wheeze Abdominal: Soft, NT, ND, bowel sounds + Extremities: non pitting  edema, no cyanosis   The results of significant diagnostics from this hospitalization (including imaging, microbiology, ancillary and laboratory) are listed below for reference.    Significant Diagnostic Studies: DG CHEST PORT 1 VIEW  Result Date: 04/08/2021 CLINICAL DATA:  Shortness of breath for 1 week EXAM: PORTABLE CHEST 1 VIEW COMPARISON:  04/03/2021 FINDINGS: Cardiac shadow is mildly enlarged. Lungs are clear bilaterally. No  bony abnormality is noted. IMPRESSION: No active disease. Electronically Signed   By: Inez Catalina M.D.   On: 04/08/2021 01:06   DG Chest Port 1 View  Result Date: 04/03/2021 CLINICAL DATA:  Cough. EXAM: PORTABLE CHEST 1 VIEW COMPARISON:  Chest radiograph dated 03/27/2021. FINDINGS: Cardiomegaly with mild central vascular congestion. No focal consolidation, pleural effusion, or pneumothorax. No acute osseous pathology. IMPRESSION: Cardiomegaly with mild central vascular congestion. No focal consolidation. Electronically Signed   By: Anner Crete M.D.   On: 04/03/2021 01:37   DG Chest Portable 1 View  Result Date: 03/27/2021 CLINICAL DATA:  Shortness of breath, cough EXAM: PORTABLE CHEST 1 VIEW COMPARISON:  Previous studies including the examination of 02/02/2021 FINDINGS: Transverse diameter of heart is increased. There are no signs of pulmonary edema or focal pulmonary consolidation. There is no pleural effusion or pneumothorax. IMPRESSION: Cardiomegaly. There are no signs of pulmonary edema or focal pulmonary consolidation. Electronically Signed   By: Elmer Picker M.D.   On: 03/27/2021 08:09   ECHOCARDIOGRAM COMPLETE  Result Date: 04/04/2021    ECHOCARDIOGRAM REPORT   Patient Name:   Chelsey Santiago Date of Exam: 04/04/2021 Medical Rec #:  [id number]       Height:       66.0 in Accession #:    [id number]      Weight:       225.0 lb Date of Birth:  05-28-1975       BSA:          2.102 m Patient Age:    63 years        BP:           177/108 mmHg Patient  Gender: F               HR:           76 bpm. Exam Location:  Inpatient Procedure: 2D Echo, Color Doppler and Cardiac Doppler Indications:    R67.89 Acute systolic (congestive) heart failure  History:        Patient has prior history of Echocardiogram examinations, most                 recent 03/28/2020. CHF; Risk Factors:Hypertension and Sleep                 Apnea.  Sonographer:    Raquel Sarna Senior RDCS Referring Phys: [phone] Vanassa Penniman IMPRESSIONS  1. Left ventricular ejection fraction, by estimation, is 30 to 35%. The left ventricle has moderately decreased function. The left ventricle demonstrates global hypokinesis. There is moderate left ventricular hypertrophy. Left ventricular diastolic parameters are consistent with Grade II diastolic dysfunction (pseudonormalization). Elevated left atrial pressure.  2. Right ventricular systolic function is normal. The right ventricular size is mildly enlarged.  3. Left atrial size was mildly dilated.  4. Right atrial size was mildly dilated.  5. The mitral valve is normal in structure. No evidence of mitral valve regurgitation. No evidence of mitral stenosis.  6. The aortic valve has an indeterminant number of cusps. Aortic valve regurgitation is not visualized. Mild aortic valve stenosis.  7. The inferior vena cava is normal in size with greater than 50% respiratory variability, suggesting right atrial pressure of 3 mmHg. FINDINGS  Left Ventricle: Left ventricular ejection fraction, by estimation, is 30 to 35%. The left ventricle has moderately decreased function. The left ventricle demonstrates global hypokinesis. The left ventricular internal cavity size was normal in size. There is moderate left ventricular hypertrophy. Left ventricular  diastolic parameters are consistent with Grade II diastolic dysfunction (pseudonormalization). Elevated left atrial pressure. Right Ventricle: The right ventricular size is mildly enlarged. Right ventricular systolic function is normal. Left  Atrium: Left atrial size was mildly dilated. Right Atrium: Right atrial size was mildly dilated. Pericardium: There is no evidence of pericardial effusion. Mitral Valve: The mitral valve is normal in structure. No evidence of mitral valve regurgitation. No evidence of mitral valve stenosis. Tricuspid Valve: The tricuspid valve is normal in structure. Tricuspid valve regurgitation is trivial. No evidence of tricuspid stenosis. Aortic Valve: The aortic valve has an indeterminant number of cusps. Aortic valve regurgitation is not visualized. Mild aortic stenosis is present. Aortic valve mean gradient measures 10.0 mmHg. Aortic valve peak gradient measures 16.7 mmHg. Aortic valve  area, by VTI measures 2.02 cm. Pulmonic Valve: The pulmonic valve was normal in structure. Pulmonic valve regurgitation is not visualized. No evidence of pulmonic stenosis. Aorta: The aortic root is normal in size and structure. Venous: The inferior vena cava is normal in size with greater than 50% respiratory variability, suggesting right atrial pressure of 3 mmHg. IAS/Shunts: No atrial level shunt detected by color flow Doppler.  LEFT VENTRICLE PLAX 2D LVIDd:         5.30 cm      Diastology LVIDs:         4.30 cm      LV e' medial:    4.13 cm/s LV PW:         1.50 cm      LV E/e' medial:  21.2 LV IVS:        1.10 cm      LV e' lateral:   3.48 cm/s LVOT diam:     2.10 cm      LV E/e' lateral: 25.1 LV SV:         65 LV SV Index:   31 LVOT Area:     3.46 cm  LV Volumes (MOD) LV vol d, MOD A2C: 104.0 ml LV vol d, MOD A4C: 112.0 ml LV vol s, MOD A2C: 72.8 ml LV vol s, MOD A4C: 64.0 ml LV SV MOD A2C:     31.2 ml LV SV MOD A4C:     112.0 ml LV SV MOD BP:      39.8 ml RIGHT VENTRICLE RV S prime:     8.92 cm/s TAPSE (M-mode): 2.3 cm LEFT ATRIUM             Index        RIGHT ATRIUM           Index LA diam:        4.40 cm 2.09 cm/m   RA Area:     22.30 cm LA Vol (A2C):   48.6 ml 23.12 ml/m  RA Volume:   68.30 ml  32.49 ml/m LA Vol (A4C):   76.3  ml 36.29 ml/m LA Biplane Vol: 62.5 ml 29.73 ml/m  AORTIC VALVE AV Area (Vmax):    1.83 cm AV Area (Vmean):   2.01 cm AV Area (VTI):     2.02 cm AV Vmax:           204.50 cm/s AV Vmean:          142.500 cm/s AV VTI:            0.324 m AV Peak Grad:      16.7 mmHg AV Mean Grad:      10.0 mmHg LVOT Vmax:  108.00 cm/s LVOT Vmean:        82.600 cm/s LVOT VTI:          0.189 m LVOT/AV VTI ratio: 0.58  AORTA Ao Root diam: 2.80 cm Ao Asc diam:  3.30 cm MITRAL VALVE MV Area (PHT): 3.46 cm    SHUNTS MV Decel Time: 219 msec    Systemic VTI:  0.19 m MV E velocity: 87.40 cm/s  Systemic Diam: 2.10 cm MV A velocity: 57.80 cm/s MV E/A ratio:  1.51 Kirk Ruths MD Electronically signed by Kirk Ruths MD Signature Date/Time: 04/04/2021/2:36:04 PM    Final     Microbiology: Recent Results (from the past 240 hour(s))  Resp Panel by RT-PCR (Flu A&B, Covid) Nasopharyngeal Swab     Status: Abnormal   Collection Time: 04/03/21 12:45 AM   Specimen: Nasopharyngeal Swab; Nasopharyngeal(NP) swabs in vial transport medium  Result Value Ref Range Status   SARS Coronavirus 2 by RT PCR NEGATIVE NEGATIVE Final    Comment: (NOTE) SARS-CoV-2 target nucleic acids are NOT DETECTED.  The SARS-CoV-2 RNA is generally detectable in upper respiratory specimens during the acute phase of infection. The lowest concentration of SARS-CoV-2 viral copies this assay can detect is 138 copies/mL. A negative result does not preclude SARS-Cov-2 infection and should not be used as the sole basis for treatment or other patient management decisions. A negative result may occur with  improper specimen collection/handling, submission of specimen other than nasopharyngeal swab, presence of viral mutation(s) within the areas targeted by this assay, and inadequate number of viral copies(<138 copies/mL). A negative result must be combined with clinical observations, patient history, and epidemiological information. The expected result is  Negative.  Fact Sheet for Patients:  EntrepreneurPulse.com.au  Fact Sheet for Healthcare Providers:  IncredibleEmployment.be  This test is no t yet approved or cleared by the [place] FDA and  has been authorized for detection and/or diagnosis of SARS-CoV-2 by FDA under an Emergency Use Authorization (EUA). This EUA will remain  in effect (meaning this test can be used) for the duration of the COVID-19 declaration under Section 564(b)(1) of the Act, 21 U.S.C.section 360bbb-3(b)(1), unless the authorization is terminated  or revoked sooner.       Influenza A by PCR POSITIVE (A) NEGATIVE Final   Influenza B by PCR NEGATIVE NEGATIVE Final    Comment: (NOTE) The Xpert Xpress SARS-CoV-2/FLU/RSV plus assay is intended as an aid in the diagnosis of influenza from Nasopharyngeal swab specimens and should not be used as a sole basis for treatment. Nasal washings and aspirates are unacceptable for Xpert Xpress SARS-CoV-2/FLU/RSV testing.  Fact Sheet for Patients: EntrepreneurPulse.com.au  Fact Sheet for Healthcare Providers: [employer]redible[employer]  This test is not yet approved or cleared by the [place] FDA and has been authorized for detection and/or diagnosis of SARS-CoV-2 by FDA under an Emergency Use Authorization (EUA). This EUA will remain in effect (meaning this test can be used) for the duration of the COVID-19 declaration under Section 564(b)(1) of the Act, 21 U.S.C. section 360bbb-3(b)(1), unless the authorization is terminated or revoked.  Performed at [facility], [address], [place], [place] [address]      Labs: Basic Metabolic Panel: Recent Labs  Lab 04/03/21 1907 04/04/21 0631 04/05/21 0511 04/06/21 0347 04/06/21 1149 04/07/21 0708 04/08/21 0523 04/09/21 0617  NA  --  134* 138 133*  --  134* 137 134*  K  --  2.7* 3.9 3.8  --  3.9 4.0 4.1  CL  --  93* 97* 93*  --  96*  101 99  CO2  --  31 30 29   --  29 28 29   GLUCOSE  --  142* 208* 323* 495* 235* 198* 199*  BUN  --  16 14 23*  --  32* 31* 34*  CREATININE  --  0.89 1.02* 1.26*  --  1.20* 1.07* 1.24*  CALCIUM  --  7.6* 8.3* 8.7*  --  8.8* 8.8* 8.6*  MG 1.4* 1.4* 2.3  --   --   --   --  2.1  PHOS 2.9  --   --   --   --   --   --   --    Liver Function Tests: Recent Labs  Lab 04/04/21 0631  AST 22  ALT 11  ALKPHOS 65  BILITOT 0.8  PROT 6.8  ALBUMIN 2.9*   No results for input(s): LIPASE, AMYLASE in the last 168 hours. No results for input(s): AMMONIA in the last 168 hours. CBC: Recent Labs  Lab 04/03/21 0051 04/04/21 0631 04/08/21 0523  WBC 8.7 5.4 8.8  NEUTROABS 6.3  --   --   HGB 11.5* 11.8* 11.7*  HCT 36.8 38.0 38.9  MCV 74.0* 73.4* 74.0*  PLT 252 228 230   Cardiac Enzymes: No results for input(s): CKTOTAL, CKMB, CKMBINDEX, TROPONINI in the last 168 hours. BNP: Invalid input(s): POCBNP CBG: Recent Labs  Lab 04/08/21 1104 04/08/21 1623 04/08/21 2018 04/09/21 0746 04/09/21 1120  GLUCAP 123* 211* 210* 228* 111*    Time coordinating discharge:  36 minutes  Signed:  Orson Eva, DO [facility] Pager: [phone] 04/09/2021, 1:49 PM

## 2021-04-26 ENCOUNTER — Emergency Department (HOSPITAL_COMMUNITY)
Admission: EM | Admit: 2021-04-26 | Discharge: 2021-04-27 | Disposition: A | Discharge status: Home / Self Care | Payer: Medicaid Other | Attending: Emergency Medicine | Admitting: Emergency Medicine

## 2021-04-26 ENCOUNTER — Emergency Department (HOSPITAL_COMMUNITY): Payer: Medicaid Other

## 2021-04-26 ENCOUNTER — Other Ambulatory Visit: Payer: Self-pay

## 2021-04-26 DIAGNOSIS — E876 Hypokalemia: Secondary | ICD-10-CM | POA: Diagnosis not present

## 2021-04-26 DIAGNOSIS — R0789 Other chest pain: Secondary | ICD-10-CM | POA: Insufficient documentation

## 2021-04-26 DIAGNOSIS — R600 Localized edema: Secondary | ICD-10-CM | POA: Insufficient documentation

## 2021-04-26 DIAGNOSIS — Z7982 Long term (current) use of aspirin: Secondary | ICD-10-CM | POA: Insufficient documentation

## 2021-04-26 DIAGNOSIS — I5042 Chronic combined systolic (congestive) and diastolic (congestive) heart failure: Secondary | ICD-10-CM | POA: Diagnosis not present

## 2021-04-26 DIAGNOSIS — Z8616 Personal history of COVID-19: Secondary | ICD-10-CM | POA: Insufficient documentation

## 2021-04-26 DIAGNOSIS — Z79899 Other long term (current) drug therapy: Secondary | ICD-10-CM | POA: Insufficient documentation

## 2021-04-26 DIAGNOSIS — Z794 Long term (current) use of insulin: Secondary | ICD-10-CM | POA: Insufficient documentation

## 2021-04-26 DIAGNOSIS — Z20822 Contact with and (suspected) exposure to covid-19: Secondary | ICD-10-CM | POA: Insufficient documentation

## 2021-04-26 DIAGNOSIS — R778 Other specified abnormalities of plasma proteins: Secondary | ICD-10-CM | POA: Diagnosis not present

## 2021-04-26 DIAGNOSIS — I110 Hypertensive heart disease with heart failure: Secondary | ICD-10-CM | POA: Insufficient documentation

## 2021-04-26 DIAGNOSIS — D509 Iron deficiency anemia, unspecified: Secondary | ICD-10-CM | POA: Insufficient documentation

## 2021-04-26 DIAGNOSIS — I10 Essential (primary) hypertension: Secondary | ICD-10-CM

## 2021-04-26 DIAGNOSIS — E1140 Type 2 diabetes mellitus with diabetic neuropathy, unspecified: Secondary | ICD-10-CM | POA: Insufficient documentation

## 2021-04-26 LAB — CBC WITH DIFFERENTIAL/PLATELET
Abs Immature Granulocytes: 0.05 10*3/uL (ref 0.00–0.07)
Basophils Absolute: 0 10*3/uL (ref 0.0–0.1)
Basophils Relative: 0 %
Eosinophils Absolute: 0.1 10*3/uL (ref 0.0–0.5)
Eosinophils Relative: 1 %
HCT: 36.2 % (ref 36.0–46.0)
Hemoglobin: 10.9 g/dL — ABNORMAL LOW (ref 12.0–15.0)
Immature Granulocytes: 1 %
Lymphocytes Relative: 28 %
Lymphs Abs: 2.9 10*3/uL (ref 0.7–4.0)
MCH: 22.5 pg — ABNORMAL LOW (ref 26.0–34.0)
MCHC: 30.1 g/dL (ref 30.0–36.0)
MCV: 74.6 fL — ABNORMAL LOW (ref 80.0–100.0)
Monocytes Absolute: 0.4 10*3/uL (ref 0.1–1.0)
Monocytes Relative: 4 %
Neutro Abs: 6.9 10*3/uL (ref 1.7–7.7)
Neutrophils Relative %: 66 %
Platelets: 241 10*3/uL (ref 150–400)
RBC: 4.85 MIL/uL (ref 3.87–5.11)
RDW: 16.8 % — ABNORMAL HIGH (ref 11.5–15.5)
WBC: 10.4 10*3/uL (ref 4.0–10.5)
nRBC: 0.3 % — ABNORMAL HIGH (ref 0.0–0.2)

## 2021-04-26 LAB — RESP PANEL BY RT-PCR (FLU A&B, COVID) ARPGX2
Influenza A by PCR: NEGATIVE
Influenza B by PCR: NEGATIVE
SARS Coronavirus 2 by RT PCR: NEGATIVE

## 2021-04-26 LAB — BASIC METABOLIC PANEL
Anion gap: 10 (ref 5–15)
BUN: 18 mg/dL (ref 6–20)
CO2: 29 mmol/L (ref 22–32)
Calcium: 8.8 mg/dL — ABNORMAL LOW (ref 8.9–10.3)
Chloride: 96 mmol/L — ABNORMAL LOW (ref 98–111)
Creatinine, Ser: 1.14 mg/dL — ABNORMAL HIGH (ref 0.44–1.00)
GFR, Estimated: >60 mL/min (ref >60)
Glucose, Bld: 218 mg/dL — ABNORMAL HIGH (ref 70–99)
Potassium: 3.4 mmol/L — ABNORMAL LOW (ref 3.5–5.1)
Sodium: 135 mmol/L (ref 135–145)

## 2021-04-26 LAB — TROPONIN I (HIGH SENSITIVITY): Troponin I (High Sensitivity): 20 ng/L — ABNORMAL HIGH (ref <18)

## 2021-04-26 LAB — BRAIN NATRIURETIC PEPTIDE: B Natriuretic Peptide: 3982 pg/mL — ABNORMAL HIGH (ref 0.0–100.0)

## 2021-04-26 MED ORDER — OXYCODONE-ACETAMINOPHEN 5-325 MG PO TABS
1.0000 | ORAL_TABLET | Freq: Once | ORAL | Status: AC
  Administered 2021-04-26: 1 via ORAL
  Filled 2021-04-26: qty 1

## 2021-04-26 MED ORDER — ASPIRIN 81 MG PO CHEW
324.0000 mg | CHEWABLE_TABLET | Freq: Once | ORAL | Status: AC
  Administered 2021-04-26: 324 mg via ORAL
  Filled 2021-04-26: qty 4

## 2021-04-26 MED ORDER — FUROSEMIDE 10 MG/ML IJ SOLN
40.0000 mg | Freq: Once | INTRAMUSCULAR | Status: AC
  Administered 2021-04-26: 40 mg via INTRAVENOUS
  Filled 2021-04-26: qty 4

## 2021-04-26 NOTE — ED Provider Notes (Signed)
Care assumed from Dr. Melina Copa, patient with shortness of breath, chronic elevation of BNP and troponin. BNP and tropoinin are at baseline, pending repeat troponin.  Repeat troponin is unchanged.  Blood pressure is noted to be elevated.  Review of past records shows that during last hospitalization she was admitted for significantly elevated blood pressure which came down while she was in the hospital raising concerns about possible medication compliance.  Patient states that she is taking her medications, but states that she was under stress related to problems with her children.  She is advised to monitor her blood pressure at home.  She is noted to have mild hypokalemia and is given a dose of oral potassium.  She is on a potassium sparing diuretic and oral potassium at home, so additional potassium is not prescribed.  She is instructed to follow-up with her primary care provider, return precautions discussed.   Delora Fuel, MD 91/50/41 [phone]

## 2021-04-26 NOTE — ED Notes (Signed)
Attempted IV x 1 without success but was able to obtain blood work, went to flush and IV infiltrated.  Pt refused to have same nurse to attempt for second time.

## 2021-04-26 NOTE — ED Triage Notes (Signed)
SOB, weak and dizzy x 1 week.

## 2021-04-26 NOTE — ED Provider Notes (Signed)
[facility] EMERGENCY DEPARTMENT Provider Note   CSN: [id number] Arrival date & time: 04/26/21  2139     History Chief Complaint  Patient presents with   Shortness of Breath   Weakness    Chelsey Santiago is a 45 y.o. female.  She has a history of cardiac disease CHF hypertension.  Was recently admitted for influenza.  Complaining of some chest tightness and shortness of breath that started about a week ago.  Symptoms have been on and off since then.  She was worried it might be a heart attack because heart disease runs in her family.  She also says it could be stress because she has been arguing with her kids.  Tried a nitro prior to arrival.  Her blood pressure is also elevated and she said she has been taking her medications regularly along with her diuretics.  She is also complaining of 9 out of 10 neck pain and spasms on both lateral sides of her neck.  This has been going on for a week also.  No known trauma.  The history is provided by the patient.  Shortness of Breath Severity:  Moderate Onset quality:  Gradual Duration:  1 week Timing:  Intermittent Progression:  Unchanged Chronicity:  Recurrent Relieved by:  Nothing Worsened by:  Activity and coughing Ineffective treatments:  Diuretics Associated symptoms: chest pain and neck pain   Associated symptoms: no abdominal pain, no fever, no hemoptysis, no rash, no sore throat, no sputum production and no wheezing   Risk factors: no tobacco use       Past Medical History:  Diagnosis Date   Anemia    H&H of 10.6/33 and 07/2008 and 11.9/35 and 09/2010   Anxiety    Chronic combined systolic and diastolic CHF (congestive heart failure) ([place])    a. EF 40-45% by echo in 12/2015 with cath showing normal cors b. EF at 45% by repeat echo in 10/2018 c. EF at 30-35% in 03/2020   Depression with anxiety    Diabetes mellitus without complication ([place])    Diverticulitis    09/2020   Hypertension    Hypertensive heart disease 2009    Pulmonary edema postpartum; mild to moderate mitral regurgitation when hospitalized for CHF in 2009; Echocardiogram in 12/2009-no MR and normal EF; normal CXR in 09/2010   Migraine headache    Miscarriage 03/19/2013   Obesity 04/16/2009   Osteoarthritis, knee 03/29/2011   Preeclampsia    Pulmonary edema    Sleep apnea    Threatened abortion in early pregnancy 03/15/2013    Patient Active Problem List   Diagnosis Date Noted   Influenza A 04/03/2021   Prolonged QT interval 04/03/2021   Hyponatremia 04/03/2021   Elevated brain natriuretic peptide (BNP) level 04/03/2021   History of diverticulitis 01/08/2021   Vomiting 10/18/2020   Acute diverticulitis 10/10/2020   Chronic anemia 10/07/2020   Pneumonia due to COVID-19 virus 06/09/2020   Acute on chronic respiratory failure with hypoxia ([place]) 06/09/2020   Obstructive sleep apnea 03/29/2020   Flash pulmonary edema ([place]) 03/27/2020   Back pain 12/19/2019   Vaginal discharge 12/19/2019   Vaginal itching 12/19/2019   BV (bacterial vaginosis) 12/19/2019   Chronic hypertension 12/19/2019   Fibroids 09/26/2019   S/P hysterectomy 09/26/2019   Acute blood loss anemia 09/26/2019   Pain of upper abdomen 03/14/2019   Dysphagia 03/14/2019   Gastroesophageal reflux disease 03/14/2019   Obesity, Class II, BMI 35-39.9    Acute exacerbation of CHF (congestive heart  failure) ([place]) 10/26/2018   Chronic combined systolic and diastolic CHF (congestive heart failure) ([place]) 06/02/2018   Hyperglycemia due to diabetes mellitus ([place]) 06/02/2018   Influenza B 05/13/2018   Hypomagnesemia 05/12/2018   Headache 05/12/2018   Upper respiratory tract infection    HCAP (healthcare-associated pneumonia)    Diarrhea 10/17/2017   Bad headache    CHF exacerbation ([place]) 09/29/2017   Iron deficiency anemia 05/16/2017   Vitamin D deficiency 11/25/2016   Iron deficiency 11/25/2016   History of acute myocardial infarction 10/05/2016   CHF (congestive heart  failure) ([place]) 05/06/2016   Diabetes mellitus with complication ([place]) 96/29/5284   Leukocytosis 05/06/2016   Neuropathy 05/06/2016   Depression 04/16/2016   Chronic tension-type headache, not intractable 04/16/2016   AKI (acute kidney injury) ([place])    Hyperkalemia    Nonischemic cardiomyopathy (HCC)    Acute on chronic combined systolic and diastolic ACC/AHA stage C congestive heart failure ([place]) 04/03/2016   Acute on chronic combined systolic and diastolic CHF, NYHA class 4 ([place]) 04/03/2016   Hypertensive emergency 04/03/2016   Cardiomyopathy due to hypertension ([place]) 12/22/2015   Normal coronary arteries 12/22/2015   Troponin level elevated 12/22/2015   NSTEMI (non-ST elevated myocardial infarction) ([place]) 12/20/2015   Dental infection 10/10/2015   Chest pain 13/24/4010   Systolic CHF, chronic ([place]) 09/05/2015   Left lower quadrant abdominal pain    Type 2 diabetes mellitus without complication ([place]) 27/25/3664   Essential hypertension    Resistant hypertension 04/23/2014   Hypertensive urgency 04/22/2014   Acute CHF ([place]) 04/22/2014   S/P cesarean section 04/11/2014   Acute pulmonary edema ([place]) 04/11/2014   Postoperative anemia 04/11/2014   Elevated serum creatinine 04/11/2014   Preeclampsia, severe 04/09/2014   Pre-eclampsia superimposed on chronic hypertension, antepartum 04/08/2014   Dyspnea 04/08/2014   Polyhydramnios in third trimester, antepartum 03/14/2014   Abnormal maternal glucose tolerance, antepartum 03/11/2014   High-risk pregnancy 03/11/2014   Pre-existing essential hypertension complicating pregnancy 40/34/7425   Impaired glucose tolerance during pregnancy, antepartum 11/27/2013   Leiomyoma of uterus 11/22/2013   History of gestational diabetes in prior pregnancy, currently pregnant in first trimester 11/22/2013   Hx of preeclampsia, prior pregnancy, currently pregnant 11/22/2013   Short interval between pregnancies affecting pregnancy, antepartum 11/22/2013    Supervision of high-risk pregnancy of elderly primigravida (>= 21 years old at delivery), third trimester 11/22/2013   Miscarriage 03/19/2013   Major depressive disorder, single episode, unspecified 09/27/2011   Hypertension    Hypertensive cardiovascular disease    Microcytic anemia    Osteoarthrosis involving lower leg 03/29/2011   Hypokalemia 12/12/2009   OSA on CPAP 12/09/2009   Morbid obesity ([place]) 04/16/2009    Past Surgical History:  Procedure Laterality Date   BREAST REDUCTION SURGERY  2002   CARDIAC CATHETERIZATION N/A 12/22/2015   Procedure: Left Heart Cath and Coronary Angiography;  Surgeon: Peter M Martinique, MD;  Location: [facility];  Service: Cardiovascular;  Laterality: N/A;   CESAREAN SECTION N/A 04/09/2014   Procedure: CESAREAN SECTION;  Surgeon: Mora Bellman, MD;  Location: [facility];  Service: Obstetrics;  Laterality: N/A;   CHOLECYSTECTOMY     HYSTERECTOMY ABDOMINAL WITH SALPINGECTOMY N/A 09/26/2019   Procedure: HYSTERECTOMY ABDOMINAL WITH SALPINGECTOMY;  Surgeon: Florian Buff, MD;  Location: [facility];  Service: Gynecology;  Laterality: N/A;   LIPOMA EXCISION Right 09/26/2019   Procedure: EXCISION LIPOMA RIGHT VULVAR;  Surgeon: Florian Buff, MD;  Location: [facility];  Service: Gynecology;  Laterality: Right;  OB History     Gravida  11   Para  6   Term  5   Preterm  1   AB  5   Living  6      SAB  3   IAB  2   Ectopic      Multiple  0   Live Births  6           Family History  Problem Relation Age of Onset   Diabetes Mother 39   Heart disease Mother    Hyperlipidemia Paternal Grandfather    Hypertension Paternal Grandfather    Heart disease Father    Hypertension Father    Heart disease Maternal Grandmother 61   ADD / ADHD Son    Hypertension Maternal Uncle    Heart attack Brother    Sudden death Neg Hx    Colon cancer Neg Hx    Celiac disease Neg Hx    Inflammatory bowel disease Neg Hx     Social History   Tobacco  Use   Smoking status: Never   Smokeless tobacco: Never  Vaping Use   Vaping Use: Never used  Substance Use Topics   Alcohol use: Yes    Comment: occ   Drug use: No    Home Medications Prior to Admission medications   Medication Sig Start Date End Date Taking? Authorizing Provider  ACCU-CHEK GUIDE test strip USE AS DIRECTED TO TESTCBLOOD SUGAR TWICE DAILY. 06/11/20   [provider]  albuterol (VENTOLIN HFA) 108 (90 Base) MCG/ACT inhaler Inhale 1 puff into the lungs every 6 (six) hours as needed for wheezing or shortness of breath. 03/30/20   Kathie Dike, MD  amLODipine (NORVASC) 10 MG tablet Take 10 mg by mouth daily. 01/06/21   [provider]  ARIPiprazole (ABILIFY) 10 MG tablet Take 1 tablet (10 mg total) by mouth daily. 10/30/18   Barton Dubois, MD  aspirin EC 81 MG tablet Take 81 mg by mouth daily.    [provider]  budesonide-formoterol (SYMBICORT) 80-4.5 MCG/ACT inhaler Inhale 2 puffs into the lungs 2 (two) times daily. 03/30/20   Kathie Dike, MD  carvedilol (COREG) 25 MG tablet Take 1 tablet (25 mg total) by mouth 2 (two) times daily with a meal. 10/13/20   Johnson, Clanford L, MD  clonazePAM (KLONOPIN) 0.5 MG tablet Take 0.5 mg by mouth 2 (two) times daily. 12/19/18   [provider]  dapagliflozin propanediol (FARXIGA) 10 MG TABS tablet Take 1 tablet (10 mg total) by mouth daily before breakfast. 02/20/21   Verta Ellen., NP  gabapentin (NEURONTIN) 800 MG tablet Take 800 mg by mouth 3 (three) times daily. 09/01/20   [provider]  hydrALAZINE (APRESOLINE) 100 MG tablet Take 100 mg by mouth 3 (three) times daily. 01/06/21   [provider]  insulin glargine (LANTUS) 100 UNIT/ML injection Inject 0.35 mLs (35 Units total) into the skin at bedtime. 10/20/20   Barton Dubois, MD  insulin lispro (HUMALOG) 100 UNIT/ML injection Inject 10-16 Units into the skin 3 (three) times daily before meals.    [provider]   Ipratropium-Albuterol (COMBIVENT RESPIMAT) 20-100 MCG/ACT AERS respimat Inhale 1 puff into the lungs every 6 (six) hours as needed for wheezing or shortness of breath. 05/04/18   Manuella Ghazi, Pratik D, DO  ipratropium-albuterol (DUONEB) 0.5-2.5 (3) MG/3ML SOLN Take 3 mLs by nebulization 3 (three) times daily. 04/09/21   Orson Eva, MD  isosorbide mononitrate (IMDUR) 30 MG 24  hr tablet Take 1 tablet (30 mg total) by mouth daily. 10/14/20   Johnson, Clanford L, MD  liraglutide (VICTOZA) 18 MG/3ML SOPN Inject 1.2 mg into the skin daily. Patient not taking: Reported on 04/03/2021 09/04/20   Brita Romp, NP  potassium chloride SA (KLOR-CON) 20 MEQ tablet Take 20 mEq by mouth daily.    [provider]  pyridostigmine (MESTINON) 60 MG tablet Take 60 mg by mouth every 8 (eight) hours.    [provider]  sacubitril-valsartan (ENTRESTO) 97-103 MG Take 1 tablet by mouth 2 (two) times daily. 03/16/19   Arnoldo Lenis, MD  spironolactone (ALDACTONE) 25 MG tablet Take 1 tablet (25 mg total) by mouth daily. 02/04/21 05/05/21  Imogene Burn, PA-C  torsemide (DEMADEX) 20 MG tablet Take 2 tablets (40 mg total) by mouth 2 (two) times daily. 09/05/20   Verta Ellen., NP  traZODone (DESYREL) 100 MG tablet Take 2 tablets (200 mg total) by mouth at bedtime. 10/29/18   Barton Dubois, MD    Allergies    Diclofenac, Tramadol, and Vicodin [hydrocodone-acetaminophen]  Review of Systems   Review of Systems  Constitutional:  Negative for fever.  HENT:  Negative for sore throat.   Eyes:  Negative for visual disturbance.  Respiratory:  Positive for shortness of breath. Negative for hemoptysis, sputum production and wheezing.   Cardiovascular:  Positive for chest pain and leg swelling (chronic).  Gastrointestinal:  Negative for abdominal pain.  Genitourinary:  Negative for dysuria.  Musculoskeletal:  Positive for neck pain.  Skin:  Negative for rash.  Neurological:  Positive for dizziness.    Physical Exam Updated Vital Signs BP (!) 199/129 (BP Location: Right Arm)   Pulse 88   Temp 97.7 F (36.5 C) (Oral)   Resp (!) 21   Ht 5\' 6"  (1.676 m)   Wt 101.2 kg   LMP 08/21/2019   SpO2 98%   BMI 35.99 kg/m   Physical Exam Vitals and nursing note reviewed.  Constitutional:      General: She is not in acute distress.    Appearance: She is well-developed.  HENT:     Head: Normocephalic and atraumatic.  Eyes:     Conjunctiva/sclera: Conjunctivae normal.  Cardiovascular:     Rate and Rhythm: Normal rate and regular rhythm.     Heart sounds: No murmur heard. Pulmonary:     Effort: Pulmonary effort is normal. No respiratory distress.     Breath sounds: Normal breath sounds.  Abdominal:     Palpations: Abdomen is soft.     Tenderness: There is no abdominal tenderness.  Musculoskeletal:        General: No swelling.     Cervical back: Neck supple.     Right lower leg: No tenderness. Edema present.     Left lower leg: No tenderness. Edema present.  Skin:    General: Skin is warm and dry.     Capillary Refill: Capillary refill takes less than 2 seconds.  Neurological:     General: No focal deficit present.     Mental Status: She is alert.  Psychiatric:        Mood and Affect: Mood normal.    ED Results / Procedures / Treatments   Labs (all labs ordered are listed, but only abnormal results are displayed) Labs Reviewed  BASIC METABOLIC PANEL - Abnormal; Notable for the following components:      Result Value   Potassium 3.4 (*)    Chloride  96 (*)    Glucose, Bld 218 (*)    Creatinine, Ser 1.14 (*)    Calcium 8.8 (*)    All other components within normal limits  BRAIN NATRIURETIC PEPTIDE - Abnormal; Notable for the following components:   B Natriuretic Peptide 3,982.0 (*)    All other components within normal limits  CBC WITH DIFFERENTIAL/PLATELET - Abnormal; Notable for the following components:   Hemoglobin 10.9 (*)    MCV 74.6 (*)    MCH 22.5 (*)    RDW  16.8 (*)    nRBC 0.3 (*)    All other components within normal limits  TROPONIN I (HIGH SENSITIVITY) - Abnormal; Notable for the following components:   Troponin I (High Sensitivity) 20 (*)    All other components within normal limits  TROPONIN I (HIGH SENSITIVITY) - Abnormal; Notable for the following components:   Troponin I (High Sensitivity) 20 (*)    All other components within normal limits  RESP PANEL BY RT-PCR (FLU A&B, COVID) [id number]  CBC WITH DIFFERENTIAL/PLATELET    EKG EKG Interpretation  Date/Time:  Sunday April 26 2021 23:52:24 EST Ventricular Rate:  75 PR Interval:  173 QRS Duration: 110 QT Interval:  464 QTC Calculation: 519 R Axis:   -36 Text Interpretation: Sinus rhythm LAE, consider biatrial enlargement Left ventricular hypertrophy Anterior Q waves, possibly due to LVH Nonspecific T abnormalities, lateral leads Prolonged QT interval When compared with ECG of 04/03/2021, No significant change was found Confirmed by Delora Fuel ([id number]) on 04/27/2021 12:12:25 AM  Radiology DG Chest Port 1 View  Result Date: 04/26/2021 CLINICAL DATA:  Shortness of breath EXAM: PORTABLE CHEST 1 VIEW COMPARISON:  04/08/2021 FINDINGS: Cardiomegaly. Lungs clear. No effusions or edema. No acute bony abnormality. IMPRESSION: Cardiomegaly.  No active disease. Electronically Signed   By: Rolm Baptise M.D.   On: 04/26/2021 22:55    Procedures Procedures   Medications Ordered in ED Medications  aspirin chewable tablet 324 mg (324 mg Oral Given 04/26/21 2235)  furosemide (LASIX) injection 40 mg (40 mg Intravenous Given 04/26/21 2327)  oxyCODONE-acetaminophen (PERCOCET/ROXICET) 5-325 MG per tablet 1 tablet (1 tablet Oral Given 04/26/21 2235)  potassium chloride SA (KLOR-CON M) CR tablet 40 mEq (40 mEq Oral Given 04/27/21 2841)    ED Course  I have reviewed the triage vital signs and the nursing notes.  Pertinent labs & imaging results that were available during my care of the  patient were reviewed by me and considered in my medical decision making (see chart for details).    MDM Rules/Calculators/A&P                          Esthela Brandner was evaluated in Emergency Department on 04/26/2021 for the symptoms described in the history of present illness. She was evaluated in the context of the global COVID-19 pandemic, which necessitated consideration that the patient might be at risk for infection with the SARS-CoV-2 virus that causes COVID-19. Institutional protocols and algorithms that pertain to the evaluation of patients at risk for COVID-19 are in a state of rapid change based on information released by regulatory bodies including the CDC and federal and state organizations. These policies and algorithms were followed during the patient's care in the ED.  This patient complains of chest pain neck pain shortness of breath elevated blood pressure stress; this involves an extensive number of treatment Options and is a complaint that carries with it a high risk of  complications and Morbidity. The differential includes ACS, pneumonia, CHF, hypertensive emergency, anxiety, medication noncompliance  I ordered, reviewed and interpreted labs, which included CBC with normal white count, hemoglobin slightly lower than priors, chemistries with mildly low potassium elevated creatinine elevated glucose similar to priors, BNP elevated similar to prior, troponin mildly elevated needs to be trended, COVID and flu negative I ordered medication oral pain medication, IV Lasix, oral aspirin I ordered imaging studies which included chest x-ray and I independently    visualized and interpreted imaging which showed cardiomegaly no acute findings  Previous records obtained and reviewed in epic, patient has been admitted before for CHF and hypertensive emergency  After the interventions stated above, I reevaluated the patient and found patient be resting comfortably.  Blood pressures remain  elevated.  Oxygenating well.  Care signed out to oncoming provider Dr. Roxanne Mins to follow-up on second troponin.  Likely can be discharged to follow-up outpatient with her treating providers if symptoms improved.   Final Clinical Impression(s) / ED Diagnoses Final diagnoses:  Chest discomfort  Elevated blood pressure reading with diagnosis of hypertension  Hypokalemia  Microcytic anemia  Elevated troponin    Rx / DC Orders ED Discharge Orders     None        Hayden Rasmussen, MD 04/27/21 1044

## 2021-04-27 LAB — TROPONIN I (HIGH SENSITIVITY): Troponin I (High Sensitivity): 20 ng/L — ABNORMAL HIGH (ref <18)

## 2021-04-27 MED ORDER — POTASSIUM CHLORIDE CRYS ER 20 MEQ PO TBCR
40.0000 meq | EXTENDED_RELEASE_TABLET | Freq: Once | ORAL | Status: AC
  Administered 2021-04-27: 40 meq via ORAL
  Filled 2021-04-27: qty 2

## 2021-04-27 NOTE — Discharge Instructions (Addendum)
Continue taking all of your medications as directed.  Please check your blood pressure once a day.  If it is staying consistently elevated, you may need to have your blood pressure medication adjusted.  Return to the emergency department if you are having any further problems.

## 2021-05-21 NOTE — Progress Notes (Deleted)
Referring Provider: Rosita Fire, MD Primary Care Physician:  Rosita Fire, MD Primary GI Physician: Dr. Abbey Chatters  No chief complaint on file.   HPI:   Chelsey Santiago is a 46 y.o. female presenting today to discuss rescheduling colonoscopy and EGD which was being pursued for history of diverticulitis, alternating constipation and diarrhea, IDA, dysphagia.  She was originally scheduled for colonoscopy on 11/7, but called to reschedule.  Unfortunately, we were unable to reach her to get this arranged.  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 Patient also reported history of H. pylori s/p treatment.  Also with history of postprandial upper abdominal pain with nausea, dysphagia, GERD, alternating constipation and diarrhea with associated lower abdominal pain.  We have been attempting to set up an EGD and colonoscopy since 2019.  She was last seen in our office August 2022.  She had previously been diagnosed with uncomplicated diverticulitis of distal descending colon in May 2022 during admission for CHF exacerbation.  She completed her course of antibiotics.  At the time of her visit, she reported abdominal pain had improved, but returned intermittently with associated diarrhea lasting a couple of days and spontaneously resolving.  When this occurs, she reported diarrhea could be uncontrollable with associated nocturnal bowel movements, also with mucus in her stools.  Some association with greasy foods.  Denied bright red blood per rectum.  Occasional dark stools in the setting of Pepto-Bismol.  Hemoglobin was at baseline.  Previously completed stool testing in June for similar symptoms with C. difficile and GI pathogen panel negative.  Aside from flares of diarrhea, she has baseline constipation using Linzess 145 mcg  as needed, but reported this also caused diarrhea.  Also reported dysphagia to solid foods and occasional nausea without vomiting. Denied heartburn.  Recommended colonoscopy and EGD, requested cardiac clearance, stop Linzess and use MiraLAX as needed for constipation, low-fat diet, start iron daily, follow-up after procedures.  We received cardiac clearance and patient was scheduled for procedures, but as per above, she called to cancel, and we were not able to reach her to reschedule.  Today:    Reviewed most recent labs completed 04/26/2021.  Hemoglobin 10.9, down from 11.7 in November 2022, but within prior baseline fluctuations. Cr. 1.14.     Past Medical History:  Diagnosis Date   Anemia    H&H of 10.6/33 and 07/2008 and 11.9/35 and 09/2010   Anxiety    Chronic combined systolic and diastolic CHF (congestive heart failure) ([place])    a. EF 40-45% by echo in 12/2015 with cath showing normal cors b. EF at 45% by repeat echo in 10/2018 c. EF at 30-35% in 03/2020   Depression with anxiety    Diabetes mellitus without complication ([place])    Diverticulitis    09/2020   Hypertension    Hypertensive heart disease 2009   Pulmonary edema postpartum; mild to moderate mitral regurgitation when hospitalized for CHF in 2009; Echocardiogram in 12/2009-no MR and normal EF; normal CXR in 09/2010   Migraine headache    Miscarriage 03/19/2013   Obesity 04/16/2009   Osteoarthritis, knee 03/29/2011   Preeclampsia    Pulmonary edema    Sleep apnea    Threatened abortion in early pregnancy 03/15/2013    Past Surgical History:  Procedure Laterality Date   BREAST REDUCTION SURGERY  2002  CARDIAC CATHETERIZATION N/A 12/22/2015   Procedure: Left Heart Cath and Coronary Angiography;  Surgeon: Peter M Martinique, MD;  Location: [facility];  Service: Cardiovascular;  Laterality: N/A;   CESAREAN SECTION N/A 04/09/2014   Procedure: CESAREAN SECTION;  Surgeon: Mora Bellman, MD;  Location: [facility];  Service:  Obstetrics;  Laterality: N/A;   CHOLECYSTECTOMY     HYSTERECTOMY ABDOMINAL WITH SALPINGECTOMY N/A 09/26/2019   Procedure: HYSTERECTOMY ABDOMINAL WITH SALPINGECTOMY;  Surgeon: Florian Buff, MD;  Location: [facility];  Service: Gynecology;  Laterality: N/A;   LIPOMA EXCISION Right 09/26/2019   Procedure: EXCISION LIPOMA RIGHT VULVAR;  Surgeon: Florian Buff, MD;  Location: [facility];  Service: Gynecology;  Laterality: Right;    Current Outpatient Medications  Medication Sig Dispense Refill   ACCU-CHEK GUIDE test strip USE AS DIRECTED TO TESTCBLOOD SUGAR TWICE DAILY.     albuterol (VENTOLIN HFA) 108 (90 Base) MCG/ACT inhaler Inhale 1 puff into the lungs every 6 (six) hours as needed for wheezing or shortness of breath. 18 g 0   amLODipine (NORVASC) 10 MG tablet Take 10 mg by mouth daily.     ARIPiprazole (ABILIFY) 10 MG tablet Take 1 tablet (10 mg total) by mouth daily.     aspirin EC 81 MG tablet Take 81 mg by mouth daily.     budesonide-formoterol (SYMBICORT) 80-4.5 MCG/ACT inhaler Inhale 2 puffs into the lungs 2 (two) times daily. 10.2 g 0   carvedilol (COREG) 25 MG tablet Take 1 tablet (25 mg total) by mouth 2 (two) times daily with a meal. 360 tablet 3   clonazePAM (KLONOPIN) 0.5 MG tablet Take 0.5 mg by mouth 2 (two) times daily.     dapagliflozin propanediol (FARXIGA) 10 MG TABS tablet Take 1 tablet (10 mg total) by mouth daily before breakfast. 30 tablet 6   gabapentin (NEURONTIN) 800 MG tablet Take 800 mg by mouth 3 (three) times daily.     hydrALAZINE (APRESOLINE) 100 MG tablet Take 100 mg by mouth 3 (three) times daily.     insulin glargine (LANTUS) 100 UNIT/ML injection Inject 0.35 mLs (35 Units total) into the skin at bedtime. 10 mL 11   insulin lispro (HUMALOG) 100 UNIT/ML injection Inject 10-16 Units into the skin 3 (three) times daily before meals.     Ipratropium-Albuterol (COMBIVENT RESPIMAT) 20-100 MCG/ACT AERS respimat Inhale 1 puff into the lungs every 6 (six) hours as needed for  wheezing or shortness of breath. 1 Inhaler 0   ipratropium-albuterol (DUONEB) 0.5-2.5 (3) MG/3ML SOLN Take 3 mLs by nebulization 3 (three) times daily. 360 mL 1   isosorbide mononitrate (IMDUR) 30 MG 24 hr tablet Take 1 tablet (30 mg total) by mouth daily. 30 tablet 1   liraglutide (VICTOZA) 18 MG/3ML SOPN Inject 1.2 mg into the skin daily. (Patient not taking: Reported on 04/03/2021) 9 mL 3   potassium chloride SA (KLOR-CON) 20 MEQ tablet Take 20 mEq by mouth daily.     pyridostigmine (MESTINON) 60 MG tablet Take 60 mg by mouth every 8 (eight) hours.     sacubitril-valsartan (ENTRESTO) 97-103 MG Take 1 tablet by mouth 2 (two) times daily. 60 tablet 6   spironolactone (ALDACTONE) 25 MG tablet Take 1 tablet (25 mg total) by mouth daily. 90 tablet 3   torsemide (DEMADEX) 20 MG tablet Take 2 tablets (40 mg total) by mouth 2 (two) times daily. 360 tablet 2   traZODone (DESYREL) 100 MG tablet Take 2 tablets (200 mg total) by mouth  at bedtime.     No current facility-administered medications for this visit.    Allergies as of 05/22/2021 - Review Complete 04/03/2021  Allergen Reaction Noted   Diclofenac Swelling 07/12/2011   Tramadol Nausea And Vomiting and Nausea Only 05/29/2011   Vicodin [hydrocodone-acetaminophen] Itching and Nausea Only 05/29/2011    Family History  Problem Relation Age of Onset   Diabetes Mother 52   Heart disease Mother    Hyperlipidemia Paternal Grandfather    Hypertension Paternal Grandfather    Heart disease Father    Hypertension Father    Heart disease Maternal Grandmother 41   ADD / ADHD Son    Hypertension Maternal Uncle    Heart attack Brother    Sudden death Neg Hx    Colon cancer Neg Hx    Celiac disease Neg Hx    Inflammatory bowel disease Neg Hx     Social History   Socioeconomic History   Marital status: Married    Spouse name: Not on file   Number of children: 6   Years of education: Not on file   Highest education level: Not on file   Occupational History   Occupation: unemployed    Employer: UNEMPLOYED  Tobacco Use   Smoking status: Never   Smokeless tobacco: Never  Vaping Use   Vaping Use: Never used  Substance and Sexual Activity   Alcohol use: Yes    Comment: occ   Drug use: No   Sexual activity: Yes    Birth control/protection: Surgical    Comment: hyst  Other Topics Concern   Not on file  Social History Narrative   Lives in [place]   Engaged/boyfriend (father to youngest chid)   4 children: daughter (103 as of 2013), sons (76, 4, 28 as of 2013)   Religion: christian   Social Determinants of Radio broadcast assistant Strain: Not on file  Food Insecurity: Not on file  Transportation Needs: Not on file  Physical Activity: Not on file  Stress: Not on file  Social Connections: Not on file    Review of Systems: Gen: Denies fever, chills, anorexia. Denies fatigue, weakness, weight loss.  CV: Denies chest pain, palpitations, syncope, peripheral edema, and claudication. Resp: Denies dyspnea at rest, cough, wheezing, coughing up blood, and pleurisy. GI: Denies vomiting blood, jaundice, and fecal incontinence.   Denies dysphagia or odynophagia. Derm: Denies rash, itching, dry skin Psych: Denies depression, anxiety, memory loss, confusion. No homicidal or suicidal ideation.  Heme: Denies bruising, bleeding, and enlarged lymph nodes.  Physical Exam: LMP 08/21/2019  General:   Alert and oriented. No distress noted. Pleasant and cooperative.  Head:  Normocephalic and atraumatic. Eyes:  Conjuctiva clear without scleral icterus. Mouth:  Oral mucosa pink and moist. Good dentition. No lesions. Heart:  S1, S2 present without murmurs appreciated. Lungs:  Clear to auscultation bilaterally. No wheezes, rales, or rhonchi. No distress.  Abdomen:  +BS, soft, non-tender and non-distended. No rebound or guarding. No HSM or masses noted. Msk:  Symmetrical without gross deformities. Normal posture. Extremities:   Without edema. Neurologic:  Alert and  oriented x4 Psych:  Alert and cooperative. Normal mood and affect.

## 2021-05-22 ENCOUNTER — Ambulatory Visit: Payer: Medicaid Other | Admitting: Gastroenterology

## 2021-05-25 ENCOUNTER — Encounter: Payer: Self-pay | Admitting: Internal Medicine

## 2021-07-06 ENCOUNTER — Emergency Department (HOSPITAL_COMMUNITY)
Admission: EM | Admit: 2021-07-06 | Discharge: 2021-07-06 | Disposition: A | Discharge status: Home / Self Care | Payer: Medicaid Other | Attending: Emergency Medicine | Admitting: Emergency Medicine

## 2021-07-06 ENCOUNTER — Emergency Department (HOSPITAL_COMMUNITY): Payer: Medicaid Other

## 2021-07-06 ENCOUNTER — Encounter (HOSPITAL_COMMUNITY): Payer: Self-pay | Admitting: *Deleted

## 2021-07-06 DIAGNOSIS — Z7982 Long term (current) use of aspirin: Secondary | ICD-10-CM | POA: Diagnosis not present

## 2021-07-06 DIAGNOSIS — E119 Type 2 diabetes mellitus without complications: Secondary | ICD-10-CM | POA: Diagnosis not present

## 2021-07-06 DIAGNOSIS — R079 Chest pain, unspecified: Secondary | ICD-10-CM

## 2021-07-06 DIAGNOSIS — Z79899 Other long term (current) drug therapy: Secondary | ICD-10-CM | POA: Diagnosis not present

## 2021-07-06 DIAGNOSIS — Z794 Long term (current) use of insulin: Secondary | ICD-10-CM | POA: Diagnosis not present

## 2021-07-06 DIAGNOSIS — I509 Heart failure, unspecified: Secondary | ICD-10-CM | POA: Diagnosis not present

## 2021-07-06 DIAGNOSIS — I110 Hypertensive heart disease with heart failure: Secondary | ICD-10-CM | POA: Insufficient documentation

## 2021-07-06 DIAGNOSIS — I10 Essential (primary) hypertension: Secondary | ICD-10-CM

## 2021-07-06 LAB — BASIC METABOLIC PANEL
Anion gap: 8 (ref 5–15)
BUN: 28 mg/dL — ABNORMAL HIGH (ref 6–20)
CO2: 30 mmol/L (ref 22–32)
Calcium: 9 mg/dL (ref 8.9–10.3)
Chloride: 97 mmol/L — ABNORMAL LOW (ref 98–111)
Creatinine, Ser: 1.13 mg/dL — ABNORMAL HIGH (ref 0.44–1.00)
GFR, Estimated: >60 mL/min (ref >60)
Glucose, Bld: 267 mg/dL — ABNORMAL HIGH (ref 70–99)
Potassium: 3.4 mmol/L — ABNORMAL LOW (ref 3.5–5.1)
Sodium: 135 mmol/L (ref 135–145)

## 2021-07-06 LAB — CBC
HCT: 38.7 % (ref 36.0–46.0)
Hemoglobin: 12 g/dL (ref 12.0–15.0)
MCH: 23 pg — ABNORMAL LOW (ref 26.0–34.0)
MCHC: 31 g/dL (ref 30.0–36.0)
MCV: 74.1 fL — ABNORMAL LOW (ref 80.0–100.0)
Platelets: 214 10*3/uL (ref 150–400)
RBC: 5.22 MIL/uL — ABNORMAL HIGH (ref 3.87–5.11)
RDW: 14.6 % (ref 11.5–15.5)
WBC: 7.7 10*3/uL (ref 4.0–10.5)
nRBC: 0 % (ref 0.0–0.2)

## 2021-07-06 LAB — BRAIN NATRIURETIC PEPTIDE: B Natriuretic Peptide: >4500 pg/mL — ABNORMAL HIGH (ref 0.0–100.0)

## 2021-07-06 LAB — TROPONIN I (HIGH SENSITIVITY)
Troponin I (High Sensitivity): 28 ng/L — ABNORMAL HIGH (ref <18)
Troponin I (High Sensitivity): 25 ng/L — ABNORMAL HIGH (ref <18)

## 2021-07-06 LAB — POC URINE PREG, ED: Preg Test, Ur: NEGATIVE

## 2021-07-06 MED ORDER — ASPIRIN 81 MG PO CHEW
324.0000 mg | CHEWABLE_TABLET | Freq: Once | ORAL | Status: AC
Start: 2021-07-06 — End: 2021-07-06
  Administered 2021-07-06: 324 mg via ORAL
  Filled 2021-07-06: qty 4

## 2021-07-06 MED ORDER — NITROGLYCERIN 2 % TD OINT
1.0000 [in_us] | TOPICAL_OINTMENT | Freq: Once | TRANSDERMAL | Status: AC
  Administered 2021-07-06: 1 [in_us] via TOPICAL
  Filled 2021-07-06: qty 1

## 2021-07-06 MED ORDER — HYDRALAZINE HCL 20 MG/ML IJ SOLN
20.0000 mg | Freq: Once | INTRAMUSCULAR | Status: AC
Start: 2021-07-06 — End: 2021-07-06
  Administered 2021-07-06: 20 mg via INTRAVENOUS
  Filled 2021-07-06: qty 1

## 2021-07-06 MED ORDER — FUROSEMIDE 10 MG/ML IJ SOLN
40.0000 mg | Freq: Once | INTRAMUSCULAR | Status: AC
  Administered 2021-07-06: 40 mg via INTRAVENOUS
  Filled 2021-07-06: qty 4

## 2021-07-06 NOTE — ED Notes (Signed)
Patient told this nurse to remove IV and she was going to leave. Patient states "they won't give me no ice and no water, I am leaving." PA made aware and patient signed AMA form

## 2021-07-06 NOTE — ED Provider Notes (Signed)
[facility] EMERGENCY DEPARTMENT Provider Note   CSN: [id number] Arrival date & time: 07/06/21  1301     History  Chief Complaint  Patient presents with   Chest Pain    Chelsey Santiago is a 46 y.o. female with PMHx HTN, Diabetes, CHF with EF 30-35% (04/04/21), anemia who presents to the ED today with complaint of nausea and dry heaving that began last night. Pt reports she began experiencing chest tightness today prompting ED visit. She reports hx of issues with dry heaving in the past and states she used to take Zofran however does not have any at home. She states sometimes she will get chest tightness with same. When asked if she's ever had an MI she reports yes however unable to see this in chart review. Cardiac cath 2017 clean. Pt does have a hx of CHF - denies any recent weight gain or worsening leg swelling. She reports compliance with her blood pressure medications despite BP being significantly elevated today at 188/119. She does endorse some SOB. No other complaints at this time. Denies hx of DVT/PE. No recent prolonged travel or immobilization. No hemoptysis. No active malignancy. No exogenous hormone use.   12/22/2015 Cardiac Cath 1. No significant CAD 2. Good LV function 3. Mildly elevated LV EDP.  The history is provided by the patient and medical records.      Home Medications Prior to Admission medications   Medication Sig Start Date End Date Taking? Authorizing Provider  ACCU-CHEK GUIDE test strip USE AS DIRECTED TO TESTCBLOOD SUGAR TWICE DAILY. 06/11/20   [provider]  albuterol (VENTOLIN HFA) 108 (90 Base) MCG/ACT inhaler Inhale 1 puff into the lungs every 6 (six) hours as needed for wheezing or shortness of breath. 03/30/20   Kathie Dike, MD  amLODipine (NORVASC) 10 MG tablet Take 10 mg by mouth daily. 01/06/21   [provider]  ARIPiprazole (ABILIFY) 10 MG tablet Take 1 tablet (10 mg total) by mouth daily. 10/30/18   Barton Dubois, MD   aspirin EC 81 MG tablet Take 81 mg by mouth daily.    [provider]  budesonide-formoterol (SYMBICORT) 80-4.5 MCG/ACT inhaler Inhale 2 puffs into the lungs 2 (two) times daily. 03/30/20   Kathie Dike, MD  carvedilol (COREG) 25 MG tablet Take 1 tablet (25 mg total) by mouth 2 (two) times daily with a meal. 10/13/20   Johnson, Clanford L, MD  clonazePAM (KLONOPIN) 0.5 MG tablet Take 0.5 mg by mouth 2 (two) times daily. 12/19/18   [provider]  dapagliflozin propanediol (FARXIGA) 10 MG TABS tablet Take 1 tablet (10 mg total) by mouth daily before breakfast. 02/20/21   Verta Ellen., NP  gabapentin (NEURONTIN) 800 MG tablet Take 800 mg by mouth 3 (three) times daily. 09/01/20   [provider]  hydrALAZINE (APRESOLINE) 100 MG tablet Take 100 mg by mouth 3 (three) times daily. 01/06/21   [provider]  insulin glargine (LANTUS) 100 UNIT/ML injection Inject 0.35 mLs (35 Units total) into the skin at bedtime. 10/20/20   Barton Dubois, MD  insulin lispro (HUMALOG) 100 UNIT/ML injection Inject 10-16 Units into the skin 3 (three) times daily before meals.    [provider]  Ipratropium-Albuterol (COMBIVENT RESPIMAT) 20-100 MCG/ACT AERS respimat Inhale 1 puff into the lungs every 6 (six) hours as needed for wheezing or shortness of breath. 05/04/18   Manuella Ghazi, Pratik D, DO  ipratropium-albuterol (DUONEB) 0.5-2.5 (3) MG/3ML SOLN Take 3 mLs by nebulization 3 (three) times  daily. 04/09/21   Orson Eva, MD  isosorbide mononitrate (IMDUR) 30 MG 24 hr tablet Take 1 tablet (30 mg total) by mouth daily. 10/14/20   Johnson, Clanford L, MD  liraglutide (VICTOZA) 18 MG/3ML SOPN Inject 1.2 mg into the skin daily. Patient not taking: Reported on 04/03/2021 09/04/20   Brita Romp, NP  potassium chloride SA (KLOR-CON) 20 MEQ tablet Take 20 mEq by mouth daily.    [provider]  pyridostigmine (MESTINON) 60 MG tablet Take 60 mg by mouth every 8 (eight) hours.     [provider]  sacubitril-valsartan (ENTRESTO) 97-103 MG Take 1 tablet by mouth 2 (two) times daily. 03/16/19   Arnoldo Lenis, MD  spironolactone (ALDACTONE) 25 MG tablet Take 1 tablet (25 mg total) by mouth daily. 02/04/21 05/05/21  Imogene Burn, PA-C  torsemide (DEMADEX) 20 MG tablet Take 2 tablets (40 mg total) by mouth 2 (two) times daily. 09/05/20   Verta Ellen., NP  traZODone (DESYREL) 100 MG tablet Take 2 tablets (200 mg total) by mouth at bedtime. 10/29/18   Barton Dubois, MD      Allergies    Diclofenac, Tramadol, and Vicodin [hydrocodone-acetaminophen]    Review of Systems   Review of Systems  Constitutional:  Negative for chills, diaphoresis, fatigue, fever and unexpected weight change.  Respiratory:  Positive for shortness of breath.   Cardiovascular:  Positive for chest pain. Negative for leg swelling.  Gastrointestinal:  Positive for nausea and vomiting (dry heaves). Negative for abdominal pain, constipation and diarrhea.  All other systems reviewed and are negative.  Physical Exam Updated Vital Signs BP (!) 192/135    Pulse 86    Temp (!) 97.3 F (36.3 C) (Oral)    Resp (!) 25    LMP 08/21/2019    SpO2 93%  Physical Exam Vitals and nursing note reviewed.  Constitutional:      Appearance: She is not ill-appearing or diaphoretic.  HENT:     Head: Normocephalic and atraumatic.  Eyes:     Conjunctiva/sclera: Conjunctivae normal.  Cardiovascular:     Rate and Rhythm: Normal rate and regular rhythm.     Pulses:          Radial pulses are 2+ on the right side and 2+ on the left side.     Heart sounds: Normal heart sounds.  Pulmonary:     Effort: Pulmonary effort is normal.     Breath sounds: Normal breath sounds. No decreased breath sounds, wheezing, rhonchi or rales.  Abdominal:     Palpations: Abdomen is soft.     Tenderness: There is no abdominal tenderness.  Musculoskeletal:     Cervical back: Neck supple.     Right lower leg: No  edema.     Left lower leg: No edema.  Skin:    General: Skin is warm and dry.  Neurological:     Mental Status: She is alert.    ED Results / Procedures / Treatments   Labs (all labs ordered are listed, but only abnormal results are displayed) Labs Reviewed  BASIC METABOLIC PANEL - Abnormal; Notable for the following components:      Result Value   Potassium 3.4 (*)    Chloride 97 (*)    Glucose, Bld 267 (*)    BUN 28 (*)    Creatinine, Ser 1.13 (*)    All other components within normal limits  CBC - Abnormal; Notable for the following components:   RBC  5.22 (*)    MCV 74.1 (*)    MCH 23.0 (*)    All other components within normal limits  BRAIN NATRIURETIC PEPTIDE - Abnormal; Notable for the following components:   B Natriuretic Peptide >4,500.0 (*)    All other components within normal limits  TROPONIN I (HIGH SENSITIVITY) - Abnormal; Notable for the following components:   Troponin I (High Sensitivity) 28 (*)    All other components within normal limits  TROPONIN I (HIGH SENSITIVITY) - Abnormal; Notable for the following components:   Troponin I (High Sensitivity) 25 (*)    All other components within normal limits  POC URINE PREG, ED    EKG None  Radiology DG Chest 2 View  Result Date: 07/06/2021 CLINICAL DATA:  A 46 year old female presents with chest pain and shortness of breath since last night. EXAM: CHEST - 2 VIEW COMPARISON:  April 26, 2021. FINDINGS: Heart size is markedly enlarged and unchanged. Lungs are clear.  No sign of pleural effusion. On limited assessment there is no acute skeletal finding. IMPRESSION: Marked cardiomegaly without acute cardiopulmonary disease. Electronically Signed   By: Zetta Bills M.D.   On: 07/06/2021 13:42    Procedures Procedures    Medications Ordered in ED Medications  aspirin chewable tablet 324 mg (324 mg Oral Given 07/06/21 1522)  furosemide (LASIX) injection 40 mg (40 mg Intravenous Given 07/06/21 1538)   nitroGLYCERIN (NITROGLYN) 2 % ointment 1 inch (1 inch Topical Given 07/06/21 1540)  hydrALAZINE (APRESOLINE) injection 20 mg (20 mg Intravenous Given 07/06/21 1720)    ED Course/ Medical Decision Making/ A&P                           Medical Decision Making 46 year old female presenting to the ED today with complaint of nausea, dry heaving, chest tightness and SOB starting yesterday.  On arrival to the ED today patient is afebrile, nontachycardic and nontachypneic.  She is satting at 92% on room air which appears to be her baseline.  History of CHF with an EF of 30 to 35%.  Her blood pressure significantly elevated today at 188/119.  She states that she has been compliant with her blood pressure medication and states she last took her morning meds this morning.  Per chart review with admission in November with elevated blood pressure that seem to decrease with daily meds.  Questionable compliance.  Seen in the ED in December for similar symptoms with reassuring work-up however continued to be hypertensive at that time.  On exam she appears to be no acute distress.  Lungs clear to auscultation bilaterally.  No signs of fluid overload at this time.  EKG unchanged from previous. Will plan for chewable aspirin and workup for ACS at this time. BNP added to assess for CHF exacerbation. Pt is PERC negative at this time. Do not feel she requires D dimer.   BNP significantly elevated at > 4,500. IV lasix and nitro paste provided. Pt with good diuresis > 700 CC output however BP continued to be significantly elevated. IV hydalazine provided. I did have lengthy discussion with pt regarding admission however she declined. We discussed having her blood pressure come down prior to possibility of discharge vs AMA.   6:11 PM Nursing staff informed that pt has signed out [place]. Unable to recheck her blood pressure prior to her leaving.   Problems Addressed: Acute on chronic congestive heart failure, unspecified heart  failure type [facility]):  acute illness or injury Nonspecific chest pain: acute illness or injury Primary hypertension: acute illness or injury  Amount and/or Complexity of Data Reviewed Labs: ordered.    Details: CBC without leukocytosis. Hgb stable at 12.0. BMP with glucose 267. Potassium 3.4. Creatinine baseline at 1.13 Troponin of 28. Will repeat however this appears consistent with previous troponin testing.  BNP significantly elevated at > 4,500. Pt provided with IV lasix and nitro paste.  Repeat troponin downtrending at 25 Radiology: ordered.    Details: CXR with cardiomegaly. No other acute findings. ECG/medicine tests: ordered. Decision-making details documented in ED Course.  Risk OTC drugs. Prescription drug management. Decision regarding hospitalization.          Final Clinical Impression(s) / ED Diagnoses Final diagnoses:  Nonspecific chest pain  Acute on chronic congestive heart failure, unspecified heart failure type [facility])  Primary hypertension    Rx / DC Orders ED Discharge Orders     None         Eustaquio Maize, PA-C 07/06/21 1811    Davonna Belling, MD 07/07/21 [phone]

## 2021-07-06 NOTE — ED Triage Notes (Signed)
C/o chest tightness, history of COPD

## 2021-07-20 ENCOUNTER — Other Ambulatory Visit (HOSPITAL_COMMUNITY): Payer: Self-pay | Admitting: Internal Medicine

## 2021-07-20 DIAGNOSIS — Z1231 Encounter for screening mammogram for malignant neoplasm of breast: Secondary | ICD-10-CM

## 2021-07-30 ENCOUNTER — Ambulatory Visit (HOSPITAL_COMMUNITY): Payer: Medicaid Other

## 2021-08-02 ENCOUNTER — Other Ambulatory Visit: Payer: Self-pay

## 2021-08-02 ENCOUNTER — Encounter (HOSPITAL_COMMUNITY): Payer: Self-pay | Admitting: *Deleted

## 2021-08-02 ENCOUNTER — Emergency Department (HOSPITAL_COMMUNITY)
Admission: EM | Admit: 2021-08-02 | Discharge: 2021-08-02 | Disposition: A | Discharge status: Home / Self Care | Payer: Medicaid Other | Attending: Emergency Medicine | Admitting: Emergency Medicine

## 2021-08-02 DIAGNOSIS — I509 Heart failure, unspecified: Secondary | ICD-10-CM | POA: Insufficient documentation

## 2021-08-02 DIAGNOSIS — Z79899 Other long term (current) drug therapy: Secondary | ICD-10-CM | POA: Insufficient documentation

## 2021-08-02 DIAGNOSIS — N390 Urinary tract infection, site not specified: Secondary | ICD-10-CM | POA: Insufficient documentation

## 2021-08-02 DIAGNOSIS — I110 Hypertensive heart disease with heart failure: Secondary | ICD-10-CM | POA: Diagnosis not present

## 2021-08-02 DIAGNOSIS — R109 Unspecified abdominal pain: Secondary | ICD-10-CM

## 2021-08-02 DIAGNOSIS — Z7984 Long term (current) use of oral hypoglycemic drugs: Secondary | ICD-10-CM | POA: Diagnosis not present

## 2021-08-02 LAB — URINALYSIS, ROUTINE W REFLEX MICROSCOPIC
Bilirubin Urine: NEGATIVE
Glucose, UA: NEGATIVE mg/dL
Hgb urine dipstick: NEGATIVE
Ketones, ur: NEGATIVE mg/dL
Nitrite: NEGATIVE
Protein, ur: >=300 mg/dL — AB
Specific Gravity, Urine: 1.018 (ref 1.005–1.030)
pH: 5 (ref 5.0–8.0)

## 2021-08-02 MED ORDER — CEPHALEXIN 500 MG PO CAPS
500.0000 mg | ORAL_CAPSULE | Freq: Once | ORAL | Status: AC
  Administered 2021-08-02: 500 mg via ORAL
  Filled 2021-08-02: qty 1

## 2021-08-02 MED ORDER — CEPHALEXIN 500 MG PO CAPS: 500.0000 mg | ORAL_CAPSULE | Freq: Three times a day (TID) | ORAL | 0 refills | Status: AC

## 2021-08-02 MED ORDER — KETOROLAC TROMETHAMINE 10 MG PO TABS: 10.0000 mg | ORAL_TABLET | Freq: Three times a day (TID) | ORAL | 0 refills | Status: DC | PRN

## 2021-08-02 MED ORDER — KETOROLAC TROMETHAMINE 60 MG/2ML IM SOLN
60.0000 mg | Freq: Once | INTRAMUSCULAR | Status: AC
  Administered 2021-08-02: 60 mg via INTRAMUSCULAR
  Filled 2021-08-02: qty 2

## 2021-08-02 NOTE — ED Notes (Signed)
Pt with elevated BP, taken multiple times and SBP in 180's and DBP in the 120's.  EDP made aware and no new orders given, okay'd by EDP to discharge pt home.  ?

## 2021-08-02 NOTE — Discharge Instructions (Signed)
It appears that you do have a urinary infection, I have given you a medicine called cephalexin, please take this 3 times a day for the next 7 days ? ?You may take Toradol 1 tablet every 8 hours as needed for pain ? ?This should gradually get better but if you are getting worse with increasing fever pain or vomiting please return to the emergency department ? ?Please have your family doctor recheck your urinalysis and culture within 48 hours to make sure that it is not a resistant infection ?

## 2021-08-02 NOTE — ED Triage Notes (Signed)
Pt with left flank pain x 3 days with nausea.  Denies burning with urination. Hurts with movement.  Denies hx of kidney stones.  ?

## 2021-08-02 NOTE — ED Notes (Signed)
ED Provider at bedside. 

## 2021-08-02 NOTE — ED Provider Notes (Signed)
?[facility] ?Provider Note ? ? ?CSN: [id number] ?Arrival date & time: 08/02/21  1939 ? ?  ? ?History ? ?Chief Complaint  ?Patient presents with  ? Flank Pain  ? ? ?Chelsey Santiago is a 46 y.o. female. ? ? ?Flank Pain ? ?This patient is a 46 year old female presenting with a complaint of left-sided flank pain which has been going on for 2 days, this is definitely worse when she tries to move, it nearly goes away when she holds perfectly still and it is not associated with any vomiting diarrhea dysuria hematuria fevers or abdominal discomfort.  She has no history of kidney stones, she does have a history of hypertension and congestive heart failure and has been taking her diuretics.  She actually states that her swelling in her legs is gone down significantly over the last couple of days because she has been sitting around the house not wanting to move because the pain gets worse when she does move.  There is no pain going down her legs, there is no numbness or weakness, she has not had any fevers.  No history of back surgery, no history of IV drug use, no history of cancer. ?  ? ?Home Medications ?Prior to Admission medications   ?Medication Sig Start Date End Date Taking? Authorizing Provider  ?cephALEXin (KEFLEX) 500 MG capsule Take 1 capsule (500 mg total) by mouth 3 (three) times daily for 7 days. 08/02/21 08/09/21 Yes Noemi Chapel, MD  ?ketorolac (TORADOL) 10 MG tablet Take 1 tablet (10 mg total) by mouth every 8 (eight) hours as needed. 08/02/21  Yes Noemi Chapel, MD  ?ACCU-CHEK GUIDE test strip USE AS DIRECTED TO TESTCBLOOD SUGAR TWICE DAILY. 06/11/20   [provider]  ?albuterol (VENTOLIN HFA) 108 (90 Base) MCG/ACT inhaler Inhale 1 puff into the lungs every 6 (six) hours as needed for wheezing or shortness of breath. 03/30/20   Kathie Dike, MD  ?amLODipine (NORVASC) 10 MG tablet Take 10 mg by mouth daily. 01/06/21   [provider]  ?ARIPiprazole (ABILIFY) 10 MG tablet  Take 1 tablet (10 mg total) by mouth daily. 10/30/18   Barton Dubois, MD  ?aspirin EC 81 MG tablet Take 81 mg by mouth daily.    [provider]  ?budesonide-formoterol (SYMBICORT) 80-4.5 MCG/ACT inhaler Inhale 2 puffs into the lungs 2 (two) times daily. 03/30/20   Kathie Dike, MD  ?carvedilol (COREG) 25 MG tablet Take 1 tablet (25 mg total) by mouth 2 (two) times daily with a meal. 10/13/20   Johnson, Clanford L, MD  ?clonazePAM (KLONOPIN) 0.5 MG tablet Take 0.5 mg by mouth 2 (two) times daily. 12/19/18   [provider]  ?dapagliflozin propanediol (FARXIGA) 10 MG TABS tablet Take 1 tablet (10 mg total) by mouth daily before breakfast. 02/20/21   Verta Ellen., NP  ?gabapentin (NEURONTIN) 800 MG tablet Take 800 mg by mouth 3 (three) times daily. 09/01/20   [provider]  ?hydrALAZINE (APRESOLINE) 100 MG tablet Take 100 mg by mouth 3 (three) times daily. 01/06/21   [provider]  ?insulin glargine (LANTUS) 100 UNIT/ML injection Inject 0.35 mLs (35 Units total) into the skin at bedtime. 10/20/20   Barton Dubois, MD  ?insulin lispro (HUMALOG) 100 UNIT/ML injection Inject 10-16 Units into the skin 3 (three) times daily before meals.    [provider]  ?Ipratropium-Albuterol (COMBIVENT RESPIMAT) 20-100 MCG/ACT AERS respimat Inhale 1 puff into the lungs every 6 (six) hours as needed for wheezing  or shortness of breath. 05/04/18   Manuella Ghazi, Pratik D, DO  ?ipratropium-albuterol (DUONEB) 0.5-2.5 (3) MG/3ML SOLN Take 3 mLs by nebulization 3 (three) times daily. 04/09/21   Orson Eva, MD  ?isosorbide mononitrate (IMDUR) 30 MG 24 hr tablet Take 1 tablet (30 mg total) by mouth daily. 10/14/20   Johnson, Clanford L, MD  ?liraglutide (VICTOZA) 18 MG/3ML SOPN Inject 1.2 mg into the skin daily. ?Patient not taking: Reported on 04/03/2021 09/04/20   Brita Romp, NP  ?potassium chloride SA (KLOR-CON) 20 MEQ tablet Take 20 mEq by mouth daily.    [provider]   ?pyridostigmine (MESTINON) 60 MG tablet Take 60 mg by mouth every 8 (eight) hours.    [provider]  ?sacubitril-valsartan (ENTRESTO) 97-103 MG Take 1 tablet by mouth 2 (two) times daily. 03/16/19   Arnoldo Lenis, MD  ?spironolactone (ALDACTONE) 25 MG tablet Take 1 tablet (25 mg total) by mouth daily. 02/04/21 05/05/21  Imogene Burn, PA-C  ?torsemide (DEMADEX) 20 MG tablet Take 2 tablets (40 mg total) by mouth 2 (two) times daily. 09/05/20   Verta Ellen., NP  ?traZODone (DESYREL) 100 MG tablet Take 2 tablets (200 mg total) by mouth at bedtime. 10/29/18   Barton Dubois, MD  ?   ? ?Allergies    ?Diclofenac, Tramadol, and Vicodin [hydrocodone-acetaminophen]   ? ?Review of Systems   ?Review of Systems  ?Genitourinary:  Positive for flank pain.  ?All other systems reviewed and are negative. ? ?Physical Exam ?Updated Vital Signs ?BP (!) 143/102   Pulse 88   Temp 97.7 ?F (36.5 ?C) (Oral)   Resp 20   Ht 1.676 m ('5\' 6"'$ )   Wt 98.9 kg   LMP 08/21/2019   SpO2 98%   BMI 35.19 kg/m?  ?Physical Exam ?Vitals and nursing note reviewed.  ?Constitutional:   ?   General: She is not in acute distress. ?   Appearance: She is well-developed.  ?HENT:  ?   Head: Normocephalic and atraumatic.  ?   Mouth/Throat:  ?   Pharynx: No oropharyngeal exudate.  ?Eyes:  ?   General: No scleral icterus.    ?   Right eye: No discharge.     ?   Left eye: No discharge.  ?   Conjunctiva/sclera: Conjunctivae normal.  ?   Pupils: Pupils are equal, round, and reactive to light.  ?Neck:  ?   Thyroid: No thyromegaly.  ?   Vascular: No JVD.  ?Cardiovascular:  ?   Rate and Rhythm: Normal rate and regular rhythm.  ?   Heart sounds: Normal heart sounds. No murmur heard. ?  No friction rub. No gallop.  ?Pulmonary:  ?   Effort: Pulmonary effort is normal. No respiratory distress.  ?   Breath sounds: Normal breath sounds. No wheezing or rales.  ?Abdominal:  ?   General: Bowel sounds are normal. There is no distension.  ?   Palpations:  Abdomen is soft. There is no mass.  ?   Tenderness: There is no abdominal tenderness.  ?Musculoskeletal:     ?   General: Tenderness present. Normal range of motion.  ?   Cervical back: Normal range of motion and neck supple.  ?   Comments: The patient is able to manipulate around in the bed without significant difficulty.  However every time she tries to move with her is rotating in the bed, sitting up or changing position it causes pain in the left flank.  I  inspected the left leg and there is no rash over this area, there is reproducible tenderness over the left lateral and posterior flank and ribs, there is no tenderness in the abdomen whatsoever  ?Lymphadenopathy:  ?   Cervical: No cervical adenopathy.  ?Skin: ?   General: Skin is warm and dry.  ?   Findings: No erythema or rash.  ?Neurological:  ?   Mental Status: She is alert.  ?   Coordination: Coordination normal.  ?Psychiatric:     ?   Behavior: Behavior normal.  ? ? ?ED Results / Procedures / Treatments   ?Labs ?(all labs ordered are listed, but only abnormal results are displayed) ?Labs Reviewed  ?URINALYSIS, ROUTINE W REFLEX MICROSCOPIC - Abnormal; Notable for the following components:  ?    Result Value  ? Color, Urine AMBER (*)   ? APPearance HAZY (*)   ? Protein, ur >=300 (*)   ? Leukocytes,Ua MODERATE (*)   ? Bacteria, UA MANY (*)   ? All other components within normal limits  ?URINE CULTURE  ? ? ?EKG ?None ? ?Radiology ?No results found. ? ?Procedures ?Procedures  ? ? ?Medications Ordered in ED ?Medications  ?cephALEXin (KEFLEX) capsule 500 mg (has no administration in time range)  ?ketorolac (TORADOL) injection 60 mg (60 mg Intramuscular Given 08/02/21 2038)  ? ? ?ED Course/ Medical Decision Making/ A&P ?  ?                        ?Medical Decision Making ?Amount and/or Complexity of Data Reviewed ?Labs: ordered. ? ?Risk ?Prescription drug management. ? ? ?This patient presents to the ED for concern of flank pain differential diagnosis includes  kidney stone and UTI or pyelonephritis though this seems less likely given the significant change with movement and the lack of systemic symptoms hematuria or dysuria.  As a note she has no crepitance subcutaneous emphysem

## 2021-08-02 NOTE — ED Notes (Signed)
Pt denies any weight gain.  ?

## 2021-08-04 LAB — URINE CULTURE: Culture: <10000 — AB

## 2021-09-08 ENCOUNTER — Telehealth: Payer: Self-pay | Admitting: Cardiology

## 2021-09-08 NOTE — Telephone Encounter (Signed)
? ?  Pt c/o swelling: STAT is pt has developed SOB within 24 hours ? ?If swelling, where is the swelling located? Both legs and feet ? ?How much weight have you gained and in what time span?  ? ?Have you gained 3 pounds in a day or 5 pounds in a week?  ? ?Do you have a log of your daily weights (if so, list)?  ? ?Are you currently taking a fluid pill? Yes  ? ?Are you currently SOB? No  ? ?Have you traveled recently? No  ? ?Pt said she went to [facility] and was told she has kidney infection since then her legs and feet is swollen. She made an appt with Dr. Harl Bowie on 06/06 but she wanted to know what she can to while waiting for her appt  ?

## 2021-09-08 NOTE — Telephone Encounter (Signed)
Spoke with pt who states that she is calling to schedule an appt d/t swelling. She denies SOB, at this time. Appt made for Bernerd Pho, PA on tomorrow at 3:30pm.  ?

## 2021-09-09 ENCOUNTER — Ambulatory Visit (INDEPENDENT_AMBULATORY_CARE_PROVIDER_SITE_OTHER): Payer: Medicaid Other | Admitting: Student

## 2021-09-09 ENCOUNTER — Encounter: Payer: Self-pay | Admitting: Student

## 2021-09-09 VITALS — BP 160/98 | HR 83 | Ht 66.0 in | Wt 232.2 lb

## 2021-09-09 DIAGNOSIS — I5042 Chronic combined systolic (congestive) and diastolic (congestive) heart failure: Secondary | ICD-10-CM

## 2021-09-09 DIAGNOSIS — I10 Essential (primary) hypertension: Secondary | ICD-10-CM | POA: Diagnosis not present

## 2021-09-09 DIAGNOSIS — E782 Mixed hyperlipidemia: Secondary | ICD-10-CM

## 2021-09-09 DIAGNOSIS — Z79899 Other long term (current) drug therapy: Secondary | ICD-10-CM

## 2021-09-09 DIAGNOSIS — G4733 Obstructive sleep apnea (adult) (pediatric): Secondary | ICD-10-CM

## 2021-09-09 MED ORDER — TORSEMIDE 20 MG PO TABS: 40.0000 mg | ORAL_TABLET | Freq: Two times a day (BID) | ORAL | 3 refills | Status: DC

## 2021-09-09 NOTE — Patient Instructions (Signed)
Medication Instructions:  ? ?Increase Torsemide to 40 mg Two Times Daily  ? ?*If you need a refill on your cardiac medications before your next appointment, please call your pharmacy* ? ? ?Lab Work: ?Your physician recommends that you return for lab work in: 1 Week ( 09/16/21)  ? ?If you have labs (blood work) drawn today and your tests are completely normal, you will receive your results only by: ?MyChart Message (if you have MyChart) OR ?A paper copy in the mail ?If you have any lab test that is abnormal or we need to change your treatment, we will call you to review the results. ? ? ?Testing/Procedures: ?NONE  ? ? ?Follow-Up: ?At [facility], you and your health needs are our priority.  As part of our continuing mission to provide you with exceptional heart care, we have created designated Provider Care Teams.  These Care Teams include your primary Cardiologist (physician) and Advanced Practice Providers (APPs -  Physician Assistants and Nurse Practitioners) who all work together to provide you with the care you need, when you need it. ? ?We recommend signing up for the patient portal called "MyChart".  Sign up information is provided on this After Visit Summary.  MyChart is used to connect with patients for Virtual Visits (Telemedicine).  Patients are able to view lab/test results, encounter notes, upcoming appointments, etc.  Non-urgent messages can be sent to your provider as well.   ?To learn more about what you can do with MyChart, go to [email or web address].   ? ?Your next appointment:   ?2 month(s) ? ?The format for your next appointment:   ?In Person ? ?Provider:   ?You may see Carlyle Dolly, MD or one of the following Advanced Practice Providers on your designated Care Team:   ?Bernerd Pho, PA-C  ?Ermalinda Barrios, PA-C   ? ? ?Other Instructions ?Thank you for choosing [facility]! ?  ? ?Important Information About [facility] ? ? ? ? ? ? ?

## 2021-09-09 NOTE — Progress Notes (Signed)
? ?Cardiology Office Note   ? ?Date:  09/09/2021  ? ?ID:  Chelsey Santiago, DOB [birth year], MRN [id number] ? ?PCP:  Carrolyn Meiers, MD  ?Cardiologist: Carlyle Dolly, MD   ? ?Chief Complaint  ?Patient presents with  ? Follow-up  ?  6 month visit; Worsening edema  ? ? ?History of Present Illness:   ? ?Chelsey Santiago is a 46 y.o. female with past medical history of HFrEF/NICM (EF 40-45% by echo in 12/2015 with cath showing normal cors, EF at 45% by repeat echo in 10/2018 and 30-35% in 03/2020), HTN, IDDM and OSA who presents to the office today for 68-monthfollow-up. ? ?She was last examined by AKatina Dung NP in 02/2021 and reported that her weight had increased by over 5 pounds since her prior visit despite compliance with Torsemide 40 mg twice daily. She was encouraged to take an additional tablet for the next few days then resume her normal regimen. She was started on Farxiga 10 mg daily given her cardiomyopathy while being continued on Coreg, Entresto, Hydralazine, Imdur and Spironolactone. ? ?In the interim, she was admitted for a CHF exacerbation in 03/2021 and repeat echocardiogram at that time showed that her EF remained reduced at 30 to 35%. Was also diagnosed with Influenza A during admission and treated with antivirals. ? ?Most recently presented to the ED on 08/02/2021 for evaluation of nausea and left flank pain. Symptoms were felt to be most consistent with a UTI and she was discharged on Cephalexin ? ?In talking with the patient today, she reports having worsening abdominal distention and lower extremity edema for the past few weeks. Reports that her most recent bottle of Torsemide was listed as taking 40 mg once daily instead of 40 mg twice daily but she has been taking an extra tablet intermittently. Weight has increased by approximately 7 lbs on her home scales. She reports worsening shortness of breath and a nonproductive cough as well. No specific exertional chest pain or palpitations. BP  is elevated today but she was delayed taking her medications and took approximately one hour prior to her visit.  ? ?Past Medical History:  ?Diagnosis Date  ? Anemia   ? H&H of 10.6/33 and 07/2008 and 11.9/35 and 09/2010  ? Anxiety   ? Chronic combined systolic and diastolic CHF (congestive heart failure) ([facility] [place]   ? a. EF 40-45% by echo in 12/2015 with cath showing normal cors b. EF at 45% by repeat echo in 10/2018 c. EF at 30-35% in 03/2020  ? Depression with anxiety   ? Diabetes mellitus without complication ([facility] [place]   ? Diverticulitis   ? 09/2020  ? Hypertension   ? Hypertensive heart disease 2009  ? Pulmonary edema postpartum; mild to moderate mitral regurgitation when hospitalized for CHF in 2009; Echocardiogram in 12/2009-no MR and normal EF; normal CXR in 09/2010  ? Migraine headache   ? Miscarriage 03/19/2013  ? Obesity 04/16/2009  ? Osteoarthritis, knee 03/29/2011  ? Preeclampsia   ? Pulmonary edema   ? Sleep apnea   ? Threatened abortion in early pregnancy 03/15/2013  ? ? ?Past Surgical History:  ?Procedure Laterality Date  ? BREAST REDUCTION SURGERY  2002  ? CARDIAC CATHETERIZATION N/A 12/22/2015  ? Procedure: Left Heart Cath and Coronary Angiography;  Surgeon: Peter M JMartinique MD;  [facility];  Service: Cardiovascular;  Laterality: N/A;  ? CESAREAN SECTION N/A 04/09/2014  ? Procedure: CESAREAN SECTION;  Surgeon: PMora Bellman MD;  [facility];  Service: Obstetrics;  Laterality: N/A;  ? CHOLECYSTECTOMY    ? HYSTERECTOMY ABDOMINAL WITH SALPINGECTOMY N/A 09/26/2019  ? Procedure: HYSTERECTOMY ABDOMINAL WITH SALPINGECTOMY;  Surgeon: Florian Buff, MD;  Location: [facility];  Service: Gynecology;  Laterality: N/A;  ? LIPOMA EXCISION Right 09/26/2019  ? Procedure: EXCISION LIPOMA RIGHT VULVAR;  Surgeon: Florian Buff, MD;  Location: [facility];  Service: Gynecology;  Laterality: Right;  ? ? ?Current Medications: ?Outpatient Medications Prior to Visit  ?Medication Sig Dispense Refill  ? ACCU-CHEK GUIDE test  strip USE AS DIRECTED TO TESTCBLOOD SUGAR TWICE DAILY.    ? albuterol (VENTOLIN HFA) 108 (90 Base) MCG/ACT inhaler Inhale 1 puff into the lungs every 6 (six) hours as needed for wheezing or shortness of breath. 18 g 0  ? amLODipine (NORVASC) 10 MG tablet Take 10 mg by mouth daily.    ? aspirin EC 81 MG tablet Take 81 mg by mouth daily.    ? budesonide-formoterol (SYMBICORT) 80-4.5 MCG/ACT inhaler Inhale 2 puffs into the lungs 2 (two) times daily. 10.2 g 0  ? carvedilol (COREG) 25 MG tablet Take 1 tablet (25 mg total) by mouth 2 (two) times daily with a meal. 360 tablet 3  ? clonazePAM (KLONOPIN) 0.5 MG tablet Take 0.5 mg by mouth 2 (two) times daily.    ? dapagliflozin propanediol (FARXIGA) 10 MG TABS tablet Take 1 tablet (10 mg total) by mouth daily before breakfast. 30 tablet 6  ? gabapentin (NEURONTIN) 800 MG tablet Take 800 mg by mouth 3 (three) times daily.    ? hydrALAZINE (APRESOLINE) 100 MG tablet Take 100 mg by mouth 3 (three) times daily.    ? insulin glargine (LANTUS) 100 UNIT/ML injection Inject 0.35 mLs (35 Units total) into the skin at bedtime. 10 mL 11  ? insulin lispro (HUMALOG) 100 UNIT/ML injection Inject 10-16 Units into the skin 3 (three) times daily before meals.    ? Ipratropium-Albuterol (COMBIVENT RESPIMAT) 20-100 MCG/ACT AERS respimat Inhale 1 puff into the lungs every 6 (six) hours as needed for wheezing or shortness of breath. 1 Inhaler 0  ? ipratropium-albuterol (DUONEB) 0.5-2.5 (3) MG/3ML SOLN Take 3 mLs by nebulization 3 (three) times daily. 360 mL 1  ? ketorolac (TORADOL) 10 MG tablet Take 1 tablet (10 mg total) by mouth every 8 (eight) hours as needed. 15 tablet 0  ? potassium chloride SA (KLOR-CON) 20 MEQ tablet Take 20 mEq by mouth daily.    ? pyridostigmine (MESTINON) 60 MG tablet Take 60 mg by mouth every 8 (eight) hours.    ? sacubitril-valsartan (ENTRESTO) 97-103 MG Take 1 tablet by mouth 2 (two) times daily. 60 tablet 6  ? traZODone (DESYREL) 100 MG tablet Take 2 tablets (200  mg total) by mouth at bedtime.    ? torsemide (DEMADEX) 20 MG tablet Take 2 tablets (40 mg total) by mouth 2 (two) times daily. 360 tablet 2  ? ARIPiprazole (ABILIFY) 10 MG tablet Take 1 tablet (10 mg total) by mouth daily. (Patient not taking: Reported on 09/09/2021)    ? isosorbide mononitrate (IMDUR) 30 MG 24 hr tablet Take 1 tablet (30 mg total) by mouth daily. (Patient not taking: Reported on 09/09/2021) 30 tablet 1  ? liraglutide (VICTOZA) 18 MG/3ML SOPN Inject 1.2 mg into the skin daily. (Patient not taking: Reported on 04/03/2021) 9 mL 3  ? spironolactone (ALDACTONE) 25 MG tablet Take 1 tablet (25 mg total) by mouth daily. (Patient not taking: Reported on 09/09/2021) 90 tablet 3  ? ?  No facility-administered medications prior to visit.  ?  ? ?Allergies:   Diclofenac, Tramadol, and Vicodin [hydrocodone-acetaminophen]  ? ?Social History  ? ?Socioeconomic History  ? Marital status: Married  ?  Spouse name: Not on file  ? Number of children: 6  ? Years of education: Not on file  ? Highest education level: Not on file  ?Occupational History  ? Occupation: unemployed  ?  Employer: UNEMPLOYED  ?Tobacco Use  ? Smoking status: Never  ? Smokeless tobacco: Never  ?Vaping Use  ? Vaping Use: Never used  ?Substance and Sexual Activity  ? Alcohol use: Yes  ?  Comment: occ  ? Drug use: No  ? Sexual activity: Yes  ?  Birth control/protection: Surgical  ?  Comment: hyst  ?Other Topics Concern  ? Not on file  ?Social History Narrative  ? Lives in [place]  ? Engaged/boyfriend (father to youngest chid)  ? 4 children: daughter (35 as of 2013), sons (15, 48, 40 as of 2013)  ? Religion: christian  ? ?Social Determinants of Health  ? ?Financial Resource Strain: Not on file  ?Food Insecurity: Not on file  ?Transportation Needs: Not on file  ?Physical Activity: Not on file  ?Stress: Not on file  ?Social Connections: Not on file  ?  ? ?Family History:  The patient's family history includes ADD / ADHD in her son; Diabetes (age of onset: 38) in  her mother; Heart attack in her brother; Heart disease in her father and mother; Heart disease (age of onset: 24) in her maternal grandmother; Hyperlipidemia in her paternal grandfather; Hypertension

## 2021-10-20 ENCOUNTER — Ambulatory Visit: Payer: Medicaid Other | Admitting: Cardiology

## 2021-10-30 ENCOUNTER — Ambulatory Visit (INDEPENDENT_AMBULATORY_CARE_PROVIDER_SITE_OTHER): Payer: Medicaid Other | Admitting: Cardiology

## 2021-10-30 ENCOUNTER — Encounter: Payer: Self-pay | Admitting: Cardiology

## 2021-10-30 VITALS — BP 178/130 | HR 79 | Ht 66.0 in | Wt 226.2 lb

## 2021-10-30 DIAGNOSIS — I10 Essential (primary) hypertension: Secondary | ICD-10-CM

## 2021-10-30 DIAGNOSIS — G4733 Obstructive sleep apnea (adult) (pediatric): Secondary | ICD-10-CM

## 2021-10-30 DIAGNOSIS — I5022 Chronic systolic (congestive) heart failure: Secondary | ICD-10-CM | POA: Diagnosis not present

## 2021-10-30 MED ORDER — SPIRONOLACTONE 25 MG PO TABS: 25.0000 mg | ORAL_TABLET | Freq: Every day | ORAL | 6 refills | Status: DC

## 2021-10-30 MED ORDER — ISOSORBIDE MONONITRATE ER 30 MG PO TB24: 30.0000 mg | ORAL_TABLET | Freq: Every day | ORAL | 6 refills | Status: DC

## 2021-10-30 NOTE — Patient Instructions (Addendum)
Medication Instructions:  Aldactone & Imdur refilled today.  Continue all other medications.     Labwork: none  Testing/Procedures: Your physician has requested that you have a renal artery duplex. During this test, an ultrasound is used to evaluate blood flow to the kidneys. Allow one hour for this exam. Do not eat after midnight the day before and avoid carbonated beverages. Take your medications as you usually do.  Office will contact with results via phone or letter.     Follow-Up: 2 months   Any Other Special Instructions Will Be Listed Below (If Applicable). You have been referred to:  Pulmonology   If you need a refill on your cardiac medications before your next appointment, please call your pharmacy.

## 2021-10-30 NOTE — Progress Notes (Signed)
Clinical Summary Chelsey Santiago is a 46 y.o.female seen today for follow up of the following medical problems.   1. HTN - history of difficult to control bp - during recent admission aldactone increaed to '50mg'$  daily, has not had repeat labs yet - compliant with meds - suspect component of her HTN is related to untreated OSA, she is awaiting her cpap machine.         2. Chronic systolic heart failure - 08/8183 echo: LVEF 40-45%, grade II diastolic dysfunction - 10/3147 cath with normal coronaries 03/2016 cMRI: no myocardial LGE - 06/2017 echo LVEF 35-40%, grade II diastolic dysfunction 70/2637 echo: LVEF 30-35%, grade II dd.  -suspet hypertensive CM given long history uncontrulled bp   - occasional LE edema - weights 232-->226 lbs - compliant with meds - no SOB/DOE. Taking torsemide '40mg'$  bid.        3. OSA  - followed by Dr Luan Pulling - 06/2017 sleep study moderate OSA - has not picked up CPAP machine.        4. Orthostatic hypotension   SH: 4 kids at home. History of hysterectomy Has 13 yo grandbaby  Past Medical History:  Diagnosis Date   Anemia    H&H of 10.6/33 and 07/2008 and 11.9/35 and 09/2010   Anxiety    Chronic combined systolic and diastolic CHF (congestive heart failure) ([place])    a. EF 40-45% by echo in 12/2015 with cath showing normal cors b. EF at 45% by repeat echo in 10/2018 c. EF at 30-35% in 03/2020   Depression with anxiety    Diabetes mellitus without complication ([place])    Diverticulitis    09/2020   Hypertension    Hypertensive heart disease 2009   Pulmonary edema postpartum; mild to moderate mitral regurgitation when hospitalized for CHF in 2009; Echocardiogram in 12/2009-no MR and normal EF; normal CXR in 09/2010   Migraine headache    Miscarriage 03/19/2013   Obesity 04/16/2009   Osteoarthritis, knee 03/29/2011   Preeclampsia    Pulmonary edema    Sleep apnea    Threatened abortion in early pregnancy 03/15/2013     Allergies   Allergen Reactions   Diclofenac Swelling    AND POSSIBLE SYNCOPE; tolerates ibuprofen per pt   Tramadol Nausea And Vomiting and Nausea Only    Itching (12/21); tolerates ibuprofen per pt   Vicodin [Hydrocodone-Acetaminophen] Itching and Nausea Only     Current Outpatient Medications  Medication Sig Dispense Refill   ACCU-CHEK GUIDE test strip USE AS DIRECTED TO TESTCBLOOD SUGAR TWICE DAILY.     albuterol (VENTOLIN HFA) 108 (90 Base) MCG/ACT inhaler Inhale 1 puff into the lungs every 6 (six) hours as needed for wheezing or shortness of breath. 18 g 0   amLODipine (NORVASC) 10 MG tablet Take 10 mg by mouth daily.     ARIPiprazole (ABILIFY) 10 MG tablet Take 1 tablet (10 mg total) by mouth daily. (Patient not taking: Reported on 09/09/2021)     aspirin EC 81 MG tablet Take 81 mg by mouth daily.     budesonide-formoterol (SYMBICORT) 80-4.5 MCG/ACT inhaler Inhale 2 puffs into the lungs 2 (two) times daily. 10.2 g 0   carvedilol (COREG) 25 MG tablet Take 1 tablet (25 mg total) by mouth 2 (two) times daily with a meal. 360 tablet 3   clonazePAM (KLONOPIN) 0.5 MG tablet Take 0.5 mg by mouth 2 (two) times daily.     dapagliflozin propanediol (FARXIGA) 10 MG TABS tablet Take  1 tablet (10 mg total) by mouth daily before breakfast. 30 tablet 6   gabapentin (NEURONTIN) 800 MG tablet Take 800 mg by mouth 3 (three) times daily.     hydrALAZINE (APRESOLINE) 100 MG tablet Take 100 mg by mouth 3 (three) times daily.     insulin glargine (LANTUS) 100 UNIT/ML injection Inject 0.35 mLs (35 Units total) into the skin at bedtime. 10 mL 11   insulin lispro (HUMALOG) 100 UNIT/ML injection Inject 10-16 Units into the skin 3 (three) times daily before meals.     Ipratropium-Albuterol (COMBIVENT RESPIMAT) 20-100 MCG/ACT AERS respimat Inhale 1 puff into the lungs every 6 (six) hours as needed for wheezing or shortness of breath. 1 Inhaler 0   ipratropium-albuterol (DUONEB) 0.5-2.5 (3) MG/3ML SOLN Take 3 mLs by  nebulization 3 (three) times daily. 360 mL 1   isosorbide mononitrate (IMDUR) 30 MG 24 hr tablet Take 1 tablet (30 mg total) by mouth daily. (Patient not taking: Reported on 09/09/2021) 30 tablet 1   ketorolac (TORADOL) 10 MG tablet Take 1 tablet (10 mg total) by mouth every 8 (eight) hours as needed. 15 tablet 0   liraglutide (VICTOZA) 18 MG/3ML SOPN Inject 1.2 mg into the skin daily. (Patient not taking: Reported on 04/03/2021) 9 mL 3   potassium chloride SA (KLOR-CON) 20 MEQ tablet Take 20 mEq by mouth daily.     pyridostigmine (MESTINON) 60 MG tablet Take 60 mg by mouth every 8 (eight) hours.     sacubitril-valsartan (ENTRESTO) 97-103 MG Take 1 tablet by mouth 2 (two) times daily. 60 tablet 6   spironolactone (ALDACTONE) 25 MG tablet Take 1 tablet (25 mg total) by mouth daily. (Patient not taking: Reported on 09/09/2021) 90 tablet 3   torsemide (DEMADEX) 20 MG tablet Take 2 tablets (40 mg total) by mouth 2 (two) times daily. 360 tablet 3   traZODone (DESYREL) 100 MG tablet Take 2 tablets (200 mg total) by mouth at bedtime.     No current facility-administered medications for this visit.     Past Surgical History:  Procedure Laterality Date   BREAST REDUCTION SURGERY  2002   CARDIAC CATHETERIZATION N/A 12/22/2015   Procedure: Left Heart Cath and Coronary Angiography;  Surgeon: Peter M Martinique, MD;  Location: [facility];  Service: Cardiovascular;  Laterality: N/A;   CESAREAN SECTION N/A 04/09/2014   Procedure: CESAREAN SECTION;  Surgeon: Mora Bellman, MD;  Location: [facility];  Service: Obstetrics;  Laterality: N/A;   CHOLECYSTECTOMY     HYSTERECTOMY ABDOMINAL WITH SALPINGECTOMY N/A 09/26/2019   Procedure: HYSTERECTOMY ABDOMINAL WITH SALPINGECTOMY;  Surgeon: Florian Buff, MD;  Location: [facility];  Service: Gynecology;  Laterality: N/A;   LIPOMA EXCISION Right 09/26/2019   Procedure: EXCISION LIPOMA RIGHT VULVAR;  Surgeon: Florian Buff, MD;  Location: [facility];  Service: Gynecology;   Laterality: Right;     Allergies  Allergen Reactions   Diclofenac Swelling    AND POSSIBLE SYNCOPE; tolerates ibuprofen per pt   Tramadol Nausea And Vomiting and Nausea Only    Itching (12/21); tolerates ibuprofen per pt   Vicodin [Hydrocodone-Acetaminophen] Itching and Nausea Only      Family History  Problem Relation Age of Onset   Diabetes Mother 74   Heart disease Mother    Hyperlipidemia Paternal Grandfather    Hypertension Paternal Grandfather    Heart disease Father    Hypertension Father    Heart disease Maternal Grandmother 68   ADD / ADHD Son  Hypertension Maternal Uncle    Heart attack Brother    Sudden death Neg Hx    Colon cancer Neg Hx    Celiac disease Neg Hx    Inflammatory bowel disease Neg Hx      Social History Ms. Gilcrest reports that she has never smoked. She has never used smokeless tobacco. Ms. Walsworth reports current alcohol use.   Review of Systems CONSTITUTIONAL: No weight loss, fever, chills, weakness or fatigue.  HEENT: Eyes: No visual loss, blurred vision, double vision or yellow sclerae.No hearing loss, sneezing, congestion, runny nose or sore throat.  SKIN: No rash or itching.  CARDIOVASCULAR: per hpi RESPIRATORY: No shortness of breath, cough or sputum.  GASTROINTESTINAL: No anorexia, nausea, vomiting or diarrhea. No abdominal pain or blood.  GENITOURINARY: No burning on urination, no polyuria NEUROLOGICAL: No headache, dizziness, syncope, paralysis, ataxia, numbness or tingling in the extremities. No change in bowel or bladder control.  MUSCULOSKELETAL: No muscle, back pain, joint pain or stiffness.  LYMPHATICS: No enlarged nodes. No history of splenectomy.  PSYCHIATRIC: No history of depression or anxiety.  ENDOCRINOLOGIC: No reports of sweating, cold or heat intolerance. No polyuria or polydipsia.  Marland Kitchen   Physical Examination Today's Vitals   10/30/21 1507  BP: (!) 178/130  Pulse: 79  SpO2: 96%  Weight: 226 lb 3.2 oz  (102.6 kg)  Height: '5\' 6"'$  (1.676 m)   Body mass index is 36.51 kg/m.  Gen: resting comfortably, no acute distress HEENT: no scleral icterus, pupils equal round and reactive, no palptable cervical adenopathy,  CV: RRR, no m/r/g no jvd Resp: Clear to auscultation bilaterally GI: abdomen is soft, non-tender, non-distended, normal bowel sounds, no hepatosplenomegaly MSK: extremities are warm, no edema.  Skin: warm, no rash Neuro:  no focal deficits Psych: appropriate affect   Diagnostic Studies 10/2014 echo Study Conclusions  - Left ventricle: The cavity size was normal. There was moderate   concentric hypertrophy. Systolic function was moderately reduced.   The estimated ejection fraction was in the range of 35% to 40%.   Images were suboptimal for LV wall motion assessment. There   appeared to be global hypokinesis. Doppler parameters are   consistent with abnormal left ventricular relaxation (grade 1   diastolic dysfunction). - Mitral valve: There was trivial regurgitation. - Left atrium: The atrium was mildly dilated.     12/2015 cath The left ventricular systolic function is normal. LV end diastolic pressure is mildly elevated. The left ventricular ejection fraction is 50-55% by visual estimate.   1. No significant CAD 2. Good LV function 3. Mildly elevated LV EDP.     03/2016 cardiac MRI IMPRESSION: 1.  Technically difficult study.   2.  Normal LV size with moderate LV hypertrophy, EF 35%.   3.  Mildly dilated RV with mildly decreased RV systolic function.   4. No myocardial LGE, so no definitive evidence for prior myocardial infarction, infiltrative disease, or myocarditis.     06/2017 echo Study Conclusions   - Left ventricle: The cavity size was normal. Wall thickness was   increased in a pattern of moderate LVH. Systolic function was   moderately reduced. The estimated ejection fraction was in the   range of 35% to 40%. Diffuse hypokinesis. Features are  consistent   with a pseudonormal left ventricular filling pattern, with   concomitant abnormal relaxation and increased filling pressure   (grade 2 diastolic dysfunction). Doppler parameters are   consistent with high ventricular filling pressure. - Aortic valve: Mildly  calcified annulus. Trileaflet; mildly   thickened leaflets. - Mitral valve: There was mild regurgitation. - Left atrium: The atrium was moderately dilated.   06/2017 Sleep study IMPRESSIONS Moderate obstructive sleep apnea worse during REM sleep is noted during this study.  AutoPap 9-14  Is recommended.    Assessment and Plan  1.HTN - difficult to control, likely due to her untreated OSA  - bp above goal, refill her aldactone and imdur, follow bp's - check renal artery YS   2. Chronic systolic heart failure - no symptoms, continue current meds           Arnoldo Lenis, M.D.

## 2021-11-19 ENCOUNTER — Encounter: Payer: Self-pay | Admitting: Cardiology

## 2021-12-24 ENCOUNTER — Encounter: Payer: Self-pay | Admitting: Pulmonary Disease

## 2021-12-24 ENCOUNTER — Ambulatory Visit (INDEPENDENT_AMBULATORY_CARE_PROVIDER_SITE_OTHER): Payer: Medicaid Other | Admitting: Pulmonary Disease

## 2021-12-24 VITALS — BP 140/92 | HR 78 | Temp 98.7°F | Ht 66.0 in | Wt 231.2 lb

## 2021-12-24 DIAGNOSIS — G4733 Obstructive sleep apnea (adult) (pediatric): Secondary | ICD-10-CM

## 2021-12-24 NOTE — Patient Instructions (Signed)
Will arrange for home sleep study Will call to arrange for follow up after sleep study reviewed  

## 2021-12-24 NOTE — Progress Notes (Signed)
[facility]  Chief Complaint  Patient presents with   Consult    Patient already has CPAP but it is broken and has not used cpap in a couple of years.  did HST a few years ago.    Past Surgical History:  She  has a past surgical history that includes Breast reduction surgery (2002); Cesarean section (N/A, 04/09/2014); Cholecystectomy; Cardiac catheterization (N/A, 12/22/2015); Hysterectomy abdominal with salpingectomy (N/A, 09/26/2019); and Lipoma excision (Right, 09/26/2019).  Past Medical History:  COVID PNA January/September/November 2022, Anemia, Anxiety, CHF, HTN, Depression, DM type 2, Diverticulitis, Migraine headache, OA, Preeclampsia  Constitutional:  BP (!) 140/92 (BP Location: Left Arm, Patient Position: Sitting)   Pulse 78   Temp 98.7 F (37.1 C) (Temporal)   Ht '5\' 6"'$  (1.676 m)   Wt 231 lb 3.2 oz (104.9 kg)   LMP 08/21/2019   SpO2 97% Comment: ra  BMI 37.32 kg/m   Brief Summary:  Chelsey Santiago is a 46 y.o. female with obstructive sleep apnea.      Subjective:   She had a sleep study in 2019.  Showed moderate sleep apnea.  Was on CPAP and this help but equipment broke.  She wasn't able to get a replacement then.  She snores and wakes up feeling choked.  She is sleepy in the afternoon and falls asleep watching TV.  She talks in her sleep.  She goes to sleep at 10 pm.  She falls asleep in 10 minutes.  She wakes up 4 or 5 times to use the bathroom.  She gets out of bed between 8 and 10.  She feels okay in the morning.  She denies morning headache.  She does not use anything to help her stay awake.  She uses trazodone before going to bed.  She denies sleep walking, sleep talking, bruxism, or nightmares.  There is no history of restless legs.  She denies sleep hallucinations, sleep paralysis, or cataplexy.  The Epworth score is 20 out of 24.   Physical Exam:   Appearance - well kempt   ENMT - no sinus tenderness, no  oral exudate, no LAN, Mallampati 3 airway, no stridor  Respiratory - equal breath sounds bilaterally, no wheezing or rales  CV - s1s2 regular rate and rhythm, no murmurs  Ext - no clubbing, no edema  Skin - no rashes  Psych - normal mood and affect   Sleep Tests:  PSG 07/04/17 >> AHI 15.8, SpO2 low 77%  Cardiac Tests:  Echo 04/04/21 >> EF 30 to 35%, mod LVH, grade 2 DD  Social History:  She  reports that she has never smoked. She has never used smokeless tobacco. She reports current alcohol use. She reports that she does not use drugs.  Family History:  Her family history includes ADD / ADHD in her son; Diabetes (age of onset: 37) in her mother; Heart attack in her brother; Heart disease in her father and mother; Heart disease (age of onset: 62) in her maternal grandmother; Hyperlipidemia in her paternal grandfather; Hypertension in her father, maternal uncle, and paternal grandfather.    Discussion:  She has prior history of sleep apnea but hasn't been on therapy for the past several years after her equipment broke.  She has history of systolic CHF, hypertension, and depression.  Her BMI is > 35.  She continues to have snoring, apnea, sleep disruption, and daytime sleepiness.  I am concerned she still has significant sleep apnea.  Assessment/Plan:  Obstructive sleep apnea. - discussed how sleep apnea can impact her health - will arrange for home sleep study to assess current status of sleep apnea - assuming she still has sleep apnea will then arrange for auto CPAP set up and follow up in office after she is on therapy  Insomnia with anxiety and depression. 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 - followed by Dr. Kerry Hough with cardiology   Time Spent Involved in Patient Care on Day of Examination:  37 minutes  Follow up:   Patient Instructions  Will arrange for home sleep study Will call to arrange for  follow up after sleep study reviewed   Medication List:   Allergies as of 12/24/2021       Reactions   Diclofenac Swelling   AND POSSIBLE SYNCOPE; tolerates ibuprofen per pt   Tramadol Nausea And Vomiting, Nausea Only   Itching (12/21); tolerates ibuprofen per pt   Vicodin [hydrocodone-acetaminophen] Itching, Nausea Only        Medication List        Accurate as of December 24, 2021 10:37 AM. If you have any questions, ask your nurse or doctor.          STOP taking these medications    pyridostigmine 60 MG tablet Commonly known as: MESTINON Stopped by: Chesley Mires, MD       TAKE these medications    Accu-Chek Guide test strip Generic drug: glucose blood USE AS DIRECTED TO TESTCBLOOD SUGAR TWICE DAILY.   albuterol 108 (90 Base) MCG/ACT inhaler Commonly known as: VENTOLIN HFA Inhale 1 puff into the lungs every 6 (six) hours as needed for wheezing or shortness of breath.   amLODipine 10 MG tablet Commonly known as: NORVASC Take 10 mg by mouth daily.   aspirin EC 81 MG tablet Take 81 mg by mouth daily.   budesonide-formoterol 80-4.5 MCG/ACT inhaler Commonly known as: Symbicort Inhale 2 puffs into the lungs 2 (two) times daily.   carvedilol 25 MG tablet Commonly known as: COREG Take 1 tablet (25 mg total) by mouth 2 (two) times daily with a meal.   clonazePAM 0.5 MG tablet Commonly known as: KLONOPIN Take 0.5 mg by mouth 2 (two) times daily.   dapagliflozin propanediol 10 MG Tabs tablet Commonly known as: Farxiga Take 1 tablet (10 mg total) by mouth daily before breakfast.   Entresto 97-103 MG Generic drug: sacubitril-valsartan Take 1 tablet by mouth 2 (two) times daily.   gabapentin 800 MG tablet Commonly known as: NEURONTIN Take 800 mg by mouth 3 (three) times daily.   hydrALAZINE 100 MG tablet Commonly known as: APRESOLINE Take 100 mg by mouth 3 (three) times daily.   insulin glargine 100 UNIT/ML injection Commonly known as: LANTUS Inject  0.35 mLs (35 Units total) into the skin at bedtime.   insulin lispro 100 UNIT/ML injection Commonly known as: HUMALOG Inject 10-16 Units into the skin 3 (three) times daily before meals.   Ipratropium-Albuterol 20-100 MCG/ACT Aers respimat Commonly known as: Combivent Respimat Inhale 1 puff into the lungs every 6 (six) hours as needed for wheezing or shortness of breath.   ipratropium-albuterol 0.5-2.5 (3) MG/3ML Soln Commonly known as: DUONEB Take 3 mLs by nebulization 3 (three) times daily.   isosorbide mononitrate 30 MG 24 hr tablet Commonly known as: IMDUR Take 1 tablet (30 mg total) by mouth daily.   potassium chloride SA 20 MEQ tablet Commonly known as: KLOR-CON M Take 20 mEq  by mouth daily.   spironolactone 25 MG tablet Commonly known as: ALDACTONE Take 1 tablet (25 mg total) by mouth daily.   torsemide 20 MG tablet Commonly known as: DEMADEX Take 2 tablets (40 mg total) by mouth 2 (two) times daily.   traZODone 100 MG tablet Commonly known as: DESYREL Take 2 tablets (200 mg total) by mouth at bedtime.        Signature:  Chesley Mires, MD [place] Pager - [phone] 12/24/2021, 10:37 AM

## 2022-01-12 ENCOUNTER — Ambulatory Visit: Payer: Medicaid Other | Admitting: Cardiology

## 2022-01-12 NOTE — Progress Notes (Deleted)
Clinical Summary Chelsey Santiago is a 46 y.o.female seen today for follow up of the following medical problems.   1. HTN - history of difficult to control bp - during recent admission aldactone increaed to '50mg'$  daily, has not had repeat labs yet - compliant with meds - suspect component of her HTN is related to untreated OSA, she is awaiting her cpap machine.       - did not go for renal artery [place] since last visit    2. Chronic systolic heart failure - 09/6312 echo: LVEF 40-45%, grade II diastolic dysfunction - 01/7025 cath with normal coronaries 03/2016 cMRI: no myocardial LGE - 06/2017 echo LVEF 35-40%, grade II diastolic dysfunction 37/8588 echo: LVEF 30-35%, grade II dd.  -suspet hypertensive CM given long history uncontrulled bp     - occasional LE edema - weights 232-->226 lbs - compliant with meds - no SOB/DOE. Taking torsemide '40mg'$  bid.          3. OSA  - 06/2017 sleep study moderate OSA - followed by Dr Halford Chessman       4. Orthostatic hypotension     SH: 4 kids at home. History of hysterectomy Has 65 yo grandbaby   Past Medical History:  Diagnosis Date   Anemia    H&H of 10.6/33 and 07/2008 and 11.9/35 and 09/2010   Anxiety    Chronic combined systolic and diastolic CHF (congestive heart failure) ([place])    a. EF 40-45% by echo in 12/2015 with cath showing normal cors b. EF at 45% by repeat echo in 10/2018 c. EF at 30-35% in 03/2020   Depression with anxiety    Diabetes mellitus without complication ([place])    Diverticulitis    09/2020   Hypertension    Hypertensive heart disease 2009   Pulmonary edema postpartum; mild to moderate mitral regurgitation when hospitalized for CHF in 2009; Echocardiogram in 12/2009-no MR and normal EF; normal CXR in 09/2010   Migraine headache    Miscarriage 03/19/2013   Obesity 04/16/2009   Osteoarthritis, knee 03/29/2011   Preeclampsia    Pulmonary edema    Sleep apnea    Threatened abortion in early pregnancy 03/15/2013      Allergies  Allergen Reactions   Diclofenac Swelling    AND POSSIBLE SYNCOPE; tolerates ibuprofen per pt   Tramadol Nausea And Vomiting and Nausea Only    Itching (12/21); tolerates ibuprofen per pt   Vicodin [Hydrocodone-Acetaminophen] Itching and Nausea Only     Current Outpatient Medications  Medication Sig Dispense Refill   ACCU-CHEK GUIDE test strip USE AS DIRECTED TO TESTCBLOOD SUGAR TWICE DAILY.     albuterol (VENTOLIN HFA) 108 (90 Base) MCG/ACT inhaler Inhale 1 puff into the lungs every 6 (six) hours as needed for wheezing or shortness of breath. 18 g 0   amLODipine (NORVASC) 10 MG tablet Take 10 mg by mouth daily.     aspirin EC 81 MG tablet Take 81 mg by mouth daily.     budesonide-formoterol (SYMBICORT) 80-4.5 MCG/ACT inhaler Inhale 2 puffs into the lungs 2 (two) times daily. 10.2 g 0   carvedilol (COREG) 25 MG tablet Take 1 tablet (25 mg total) by mouth 2 (two) times daily with a meal. 360 tablet 3   clonazePAM (KLONOPIN) 0.5 MG tablet Take 0.5 mg by mouth 2 (two) times daily.     dapagliflozin propanediol (FARXIGA) 10 MG TABS tablet Take 1 tablet (10 mg total) by mouth daily before breakfast. 30 tablet  6   gabapentin (NEURONTIN) 800 MG tablet Take 800 mg by mouth 3 (three) times daily.     hydrALAZINE (APRESOLINE) 100 MG tablet Take 100 mg by mouth 3 (three) times daily.     insulin glargine (LANTUS) 100 UNIT/ML injection Inject 0.35 mLs (35 Units total) into the skin at bedtime. 10 mL 11   insulin lispro (HUMALOG) 100 UNIT/ML injection Inject 10-16 Units into the skin 3 (three) times daily before meals.     Ipratropium-Albuterol (COMBIVENT RESPIMAT) 20-100 MCG/ACT AERS respimat Inhale 1 puff into the lungs every 6 (six) hours as needed for wheezing or shortness of breath. 1 Inhaler 0   ipratropium-albuterol (DUONEB) 0.5-2.5 (3) MG/3ML SOLN Take 3 mLs by nebulization 3 (three) times daily. 360 mL 1   isosorbide mononitrate (IMDUR) 30 MG 24 hr tablet Take 1 tablet (30 mg  total) by mouth daily. 30 tablet 6   potassium chloride SA (KLOR-CON) 20 MEQ tablet Take 20 mEq by mouth daily.     sacubitril-valsartan (ENTRESTO) 97-103 MG Take 1 tablet by mouth 2 (two) times daily. 60 tablet 6   spironolactone (ALDACTONE) 25 MG tablet Take 1 tablet (25 mg total) by mouth daily. 30 tablet 6   torsemide (DEMADEX) 20 MG tablet Take 2 tablets (40 mg total) by mouth 2 (two) times daily. 360 tablet 3   traZODone (DESYREL) 100 MG tablet Take 2 tablets (200 mg total) by mouth at bedtime.     No current facility-administered medications for this visit.     Past Surgical History:  Procedure Laterality Date   BREAST REDUCTION SURGERY  2002   CARDIAC CATHETERIZATION N/A 12/22/2015   Procedure: Left Heart Cath and Coronary Angiography;  Surgeon: Peter M Martinique, MD;  Location: [facility];  Service: Cardiovascular;  Laterality: N/A;   CESAREAN SECTION N/A 04/09/2014   Procedure: CESAREAN SECTION;  Surgeon: Mora Bellman, MD;  Location: [facility];  Service: Obstetrics;  Laterality: N/A;   CHOLECYSTECTOMY     HYSTERECTOMY ABDOMINAL WITH SALPINGECTOMY N/A 09/26/2019   Procedure: HYSTERECTOMY ABDOMINAL WITH SALPINGECTOMY;  Surgeon: Florian Buff, MD;  [facility]: [facility];  Service: Gynecology;  Laterality: N/A;   LIPOMA EXCISION Right 09/26/2019   Procedure: EXCISION LIPOMA RIGHT VULVAR;  Surgeon: Florian Buff, MD;  Location: [facility];  Service: Gynecology;  Laterality: Right;     Allergies  Allergen Reactions   Diclofenac Swelling    AND POSSIBLE SYNCOPE; tolerates ibuprofen per pt   Tramadol Nausea And Vomiting and Nausea Only    Itching (12/21); tolerates ibuprofen per pt   Vicodin [Hydrocodone-Acetaminophen] Itching and Nausea Only      Family History  Problem Relation Age of Onset   Diabetes Mother 80   Heart disease Mother    Hyperlipidemia Paternal Grandfather    Hypertension Paternal Grandfather    Heart disease Father    Hypertension Father    Heart disease  Maternal Grandmother 65   ADD / ADHD Son    Hypertension Maternal Uncle    Heart attack Brother    Sudden death Neg Hx    Colon cancer Neg Hx    Celiac disease Neg Hx    Inflammatory bowel disease Neg Hx      Social History Chelsey Santiago reports that she has never smoked. She has never used smokeless tobacco. Chelsey Santiago reports current alcohol use.   Review of Systems CONSTITUTIONAL: No weight loss, fever, chills, weakness or fatigue.  HEENT: Eyes: No visual loss, blurred vision, double  vision or yellow sclerae.No hearing loss, sneezing, congestion, runny nose or sore throat.  SKIN: No rash or itching.  CARDIOVASCULAR:  RESPIRATORY: No shortness of breath, cough or sputum.  GASTROINTESTINAL: No anorexia, nausea, vomiting or diarrhea. No abdominal pain or blood.  GENITOURINARY: No burning on urination, no polyuria NEUROLOGICAL: No headache, dizziness, syncope, paralysis, ataxia, numbness or tingling in the extremities. No change in bowel or bladder control.  MUSCULOSKELETAL: No muscle, back pain, joint pain or stiffness.  LYMPHATICS: No enlarged nodes. No history of splenectomy.  PSYCHIATRIC: No history of depression or anxiety.  ENDOCRINOLOGIC: No reports of sweating, cold or heat intolerance. No polyuria or polydipsia.  Marland Kitchen   Physical Examination There were no vitals filed for this visit. There were no vitals filed for this visit.  Gen: resting comfortably, no acute distress HEENT: no scleral icterus, pupils equal round and reactive, no palptable cervical adenopathy,  CV Resp: Clear to auscultation bilaterally GI: abdomen is soft, non-tender, non-distended, normal bowel sounds, no hepatosplenomegaly MSK: extremities are warm, no edema.  Skin: warm, no rash Neuro:  no focal deficits Psych: appropriate affect   Diagnostic Studies  10/2014 echo Study Conclusions  - Left ventricle: The cavity size was normal. There was moderate   concentric hypertrophy. Systolic  function was moderately reduced.   The estimated ejection fraction was in the range of 35% to 40%.   Images were suboptimal for LV wall motion assessment. There   appeared to be global hypokinesis. Doppler parameters are   consistent with abnormal left ventricular relaxation (grade 1   diastolic dysfunction). - Mitral valve: There was trivial regurgitation. - Left atrium: The atrium was mildly dilated.     12/2015 cath The left ventricular systolic function is normal. LV end diastolic pressure is mildly elevated. The left ventricular ejection fraction is 50-55% by visual estimate.   1. No significant CAD 2. Good LV function 3. Mildly elevated LV EDP.     03/2016 cardiac MRI IMPRESSION: 1.  Technically difficult study.   2.  Normal LV size with moderate LV hypertrophy, EF 35%.   3.  Mildly dilated RV with mildly decreased RV systolic function.   4. No myocardial LGE, so no definitive evidence for prior myocardial infarction, infiltrative disease, or myocarditis.     06/2017 echo Study Conclusions   - Left ventricle: The cavity size was normal. Wall thickness was   increased in a pattern of moderate LVH. Systolic function was   moderately reduced. The estimated ejection fraction was in the   range of 35% to 40%. Diffuse hypokinesis. Features are consistent   with a pseudonormal left ventricular filling pattern, with   concomitant abnormal relaxation and increased filling pressure   (grade 2 diastolic dysfunction). Doppler parameters are   consistent with high ventricular filling pressure. - Aortic valve: Mildly calcified annulus. Trileaflet; mildly   thickened leaflets. - Mitral valve: There was mild regurgitation. - Left atrium: The atrium was moderately dilated.   06/2017 Sleep study IMPRESSIONS Moderate obstructive sleep apnea worse during REM sleep is noted during this study.  AutoPap 9-14  Is recommended.     Assessment and Plan  1.HTN - difficult to control,  likely due to her untreated OSA  - bp above goal, refill her aldactone and imdur, follow bp's - check renal artery [place]   2. Chronic systolic heart failure - no symptoms, continue current meds      Arnoldo Lenis, M.D., F.A.C.C.

## 2022-01-19 ENCOUNTER — Ambulatory Visit: Payer: Medicaid Other | Attending: Cardiology

## 2022-01-19 DIAGNOSIS — I10 Essential (primary) hypertension: Secondary | ICD-10-CM

## 2022-01-25 ENCOUNTER — Encounter: Payer: Self-pay | Admitting: *Deleted

## 2022-02-24 ENCOUNTER — Telehealth: Payer: Self-pay | Admitting: Cardiology

## 2022-02-24 MED ORDER — CARVEDILOL 25 MG PO TABS: 25.0000 mg | ORAL_TABLET | Freq: Two times a day (BID) | ORAL | 0 refills | Status: DC

## 2022-02-24 MED ORDER — ENTRESTO 97-103 MG PO TABS: 1.0000 | ORAL_TABLET | Freq: Two times a day (BID) | ORAL | 0 refills | Status: DC

## 2022-02-24 NOTE — Telephone Encounter (Signed)
Completed.

## 2022-02-24 NOTE — Telephone Encounter (Signed)
Pt came in the office today and stated that the pharmacy told her she needed to get her Dr. To send new prescriptions for her carvedilol 25 MG and Entresto 97-103 MG. She uses [facility]

## 2022-02-26 ENCOUNTER — Other Ambulatory Visit: Payer: Self-pay | Admitting: Cardiology

## 2022-03-03 ENCOUNTER — Encounter: Payer: Self-pay | Admitting: Student

## 2022-03-03 ENCOUNTER — Ambulatory Visit: Payer: Medicaid Other | Admitting: Student

## 2022-03-03 NOTE — Progress Notes (Deleted)
Cardiology Office Note    Date:  03/03/2022   ID:  Chelsey Santiago, DOB Nov 16, 1975, MRN [id number]  PCP:  Chelsey Meiers, MD  Cardiologist: Chelsey Dolly, MD    No chief complaint on file.   History of Present Illness:    Chelsey Santiago is a 46 y.o. female with past medical history of HFrEF/NICM (EF 40-45% by echo in 12/2015 with cath showing normal cors, EF at 45% by repeat echo in 10/2018 and 30-35% in 03/2020 and 03/2021), HTN, IDDM and OSA who presents to the office today for 64-monthfollow-up.   She was last examined by Dr. BHarl Bowiein 10/2021 and denied any recent anginal symptoms at that time. She was taking Torsemide '40mg'$  BID and weight had declined from 232 lbs to 226 lbs. She was continued on her current cardiac medications and it was recommended she have a renal artery [place] This showed no evidence of renal artery stenosis.     Past Medical History:  Diagnosis Date   Anemia    H&H of 10.6/33 and 07/2008 and 11.9/35 and 09/2010   Anxiety    Chronic combined systolic and diastolic CHF ([place]estive heart failure) [place]    a. EF 40-45% by echo in 12/2015 with cath showing normal cors Chelsey. EF at 45% by repeat echo in 10/2018 c. EF at 30-35% in 03/2020   Depression with anxiety    Diabetes mellitus without complication ([place]    Diverticulitis    09/2020   Hypertension    Hypertensive heart disease 2009   Pulmonary edema postpartum; mild to moderate mitral regurgitation when hospitalized for CHF in 2009; Echocardiogram in 12/2009-no MR and normal EF; normal CXR in 09/2010   Migraine headache    Miscarriage 03/19/2013   Obesity 04/16/2009   Osteoarthritis, knee 03/29/2011   Preeclampsia    Pulmonary edema    Sleep apnea    Threatened abortion in early pregnancy 03/15/2013    Past Surgical History:  Procedure Laterality Date   BREAST REDUCTION SURGERY  2002   CARDIAC CATHETERIZATION N/A 12/22/2015   Procedure: Left Heart Cath and Coronary Angiography;  Surgeon:  Chelsey M JMartinique MD;  [facility];  Service: Cardiovascular;  Laterality: N/A;   CESAREAN SECTION N/A 04/09/2014   Procedure: CESAREAN SECTION;  Surgeon: Chelsey Bellman MD;  [facility];  Service: Obstetrics;  Laterality: N/A;   CHOLECYSTECTOMY     HYSTERECTOMY ABDOMINAL WITH SALPINGECTOMY N/A 09/26/2019   Procedure: HYSTERECTOMY ABDOMINAL WITH SALPINGECTOMY;  Surgeon: Chelsey Buff MD;  [facility];  Service: Gynecology;  Laterality: N/A;   LIPOMA EXCISION Right 09/26/2019   Procedure: EXCISION LIPOMA RIGHT VULVAR;  Surgeon: Chelsey Buff MD;  [facility];  Service: Gynecology;  Laterality: Right;    Current Medications: Outpatient Medications Prior to Visit  Medication Sig Dispense Refill   ACCU-CHEK GUIDE test strip USE AS DIRECTED TO TESTCBLOOD SUGAR TWICE DAILY.     albuterol (VENTOLIN HFA) 108 (90 Base) MCG/ACT inhaler Inhale 1 puff into the lungs every 6 (six) hours as needed for wheezing or shortness of breath. 18 g 0   amLODipine (NORVASC) 10 MG tablet Take 10 mg by mouth daily.     aspirin EC 81 MG tablet Take 81 mg by mouth daily.     budesonide-formoterol (SYMBICORT) 80-4.5 MCG/ACT inhaler Inhale 2 puffs into the lungs 2 (two) times daily. 10.2 g 0   carvedilol (COREG) 25 MG tablet Take 1 tablet (25 mg total) by mouth  2 (two) times daily with a meal. 60 tablet 0   clonazePAM (KLONOPIN) 0.5 MG tablet Take 0.5 mg by mouth 2 (two) times daily.     dapagliflozin propanediol (FARXIGA) 10 MG TABS tablet Take 1 tablet (10 mg total) by mouth daily before breakfast. 30 tablet 6   gabapentin (NEURONTIN) 800 MG tablet Take 800 mg by mouth 3 (three) times daily.     hydrALAZINE (APRESOLINE) 100 MG tablet Take 100 mg by mouth 3 (three) times daily.     insulin glargine (LANTUS) 100 UNIT/ML injection Inject 0.35 mLs (35 Units total) into the skin at bedtime. 10 mL 11   insulin lispro (HUMALOG) 100 UNIT/ML injection Inject 10-16 Units into the skin 3 (three)  times daily before meals.     Ipratropium-Albuterol (COMBIVENT RESPIMAT) 20-100 MCG/ACT AERS respimat Inhale 1 puff into the lungs every 6 (six) hours as needed for wheezing or shortness of breath. 1 Inhaler 0   ipratropium-albuterol (DUONEB) 0.5-2.5 (3) MG/3ML SOLN Take 3 mLs by nebulization 3 (three) times daily. 360 mL 1   isosorbide mononitrate (IMDUR) 30 MG 24 hr tablet Take 1 tablet (30 mg total) by mouth daily. 30 tablet 6   potassium chloride SA (KLOR-CON) 20 MEQ tablet Take 20 mEq by mouth daily.     sacubitril-valsartan (ENTRESTO) 97-103 MG Take 1 tablet by mouth 2 (two) times daily. 60 tablet 0   spironolactone (ALDACTONE) 25 MG tablet Take 1 tablet (25 mg total) by mouth daily. 30 tablet 6   torsemide (DEMADEX) 20 MG tablet Take 2 tablets (40 mg total) by mouth 2 (two) times daily. 360 tablet 3   traZODone (DESYREL) 100 MG tablet Take 2 tablets (200 mg total) by mouth at bedtime.     No facility-administered medications prior to visit.     Allergies:   Diclofenac, Tramadol, and Vicodin [hydrocodone-acetaminophen]   Social History   Socioeconomic History   Marital status: Married    Spouse name: Not on file   Number of children: 6   Years of education: Not on file   Highest education level: Not on file  Occupational History   Occupation: unemployed    Employer: UNEMPLOYED  Tobacco Use   Smoking status: Never   Smokeless tobacco: Never  Vaping Use   Vaping Use: Never used  Substance and Sexual Activity   Alcohol use: Yes    Comment: occ   Drug use: No   Sexual activity: Yes    Birth control/protection: Surgical    Comment: hyst  Other Topics Concern   Not on file  Social History Narrative   Lives in [place]   Engaged/boyfriend (father to youngest chid)   4 children: daughter (82 as of 2013), sons (69, 56, 46 as of 2013)   Religion: christian   Social Determinants of Radio broadcast assistant Strain: Not on Art therapist Insecurity: Not on file  Transportation  Needs: Not on file  Physical Activity: Not on file  Stress: Not on file  Social Connections: Not on file     Family History:  The patient's ***family history includes ADD / ADHD in her son; Diabetes (age of onset: 73) in her mother; Heart attack in her brother; Heart disease in her father and mother; Heart disease (age of onset: 66) in her maternal grandmother; Hyperlipidemia in her paternal grandfather; Hypertension in her father, maternal uncle, and paternal grandfather.   Review of Systems:    Please see the history of present illness.  All other systems reviewed and are otherwise negative except as noted above.   Physical Exam:    VS:  LMP 08/21/2019    General: Well developed, well nourished,female appearing in no acute distress. Head: Normocephalic, atraumatic. Neck: No carotid bruits. JVD not elevated.  Lungs: Respirations regular and unlabored, without wheezes or rales.  Heart: ***Regular rate and rhythm. No S3 or S4.  No murmur, no rubs, or gallops appreciated. Abdomen: Appears non-distended. No obvious abdominal masses. Msk:  Strength and tone appear normal for age. No obvious joint deformities or effusions. Extremities: No clubbing or cyanosis. No edema.  Distal pedal pulses are 2+ bilaterally. Neuro: Alert and oriented X 3. Moves all extremities spontaneously. No focal deficits noted. Psych:  Responds to questions appropriately with a normal affect. Skin: No rashes or lesions noted  Wt Readings from Last 3 Encounters:  12/24/21 231 lb 3.2 oz (104.9 kg)  10/30/21 226 lb 3.2 oz (102.6 kg)  09/09/21 232 lb 3.2 oz (105.3 kg)        Studies/Labs Reviewed:   EKG:  EKG is*** ordered today.  The ekg ordered today demonstrates ***  Recent Labs: 04/04/2021: ALT 11 04/09/2021: Magnesium 2.1 07/06/2021: Chelsey Natriuretic Peptide >4,500.0; BUN 28; Creatinine, Ser 1.13; Hemoglobin 12.0; Platelets 214; Potassium 3.4; Sodium 135   Lipid Panel    Component Value Date/Time    CHOL 211 (H) 06/03/2018 0615   TRIG 223 (H) 06/03/2018 0615   HDL 39 (L) 06/03/2018 0615   CHOLHDL 5.4 06/03/2018 0615   VLDL 45 (H) 06/03/2018 0615   LDLCALC 127 (H) 06/03/2018 0615    Additional studies/ records that were reviewed today include:   Echocardiogram: 03/2021 IMPRESSIONS     1. Left ventricular ejection fraction, by estimation, is 30 to 35%. The  left ventricle has moderately decreased function. The left ventricle  demonstrates global hypokinesis. There is moderate left ventricular  hypertrophy. Left ventricular diastolic  parameters are consistent with Grade II diastolic dysfunction  (pseudonormalization). Elevated left atrial pressure.   2. Right ventricular systolic function is normal. The right ventricular  size is mildly enlarged.   3. Left atrial size was mildly dilated.   4. Right atrial size was mildly dilated.   5. The mitral valve is normal in structure. No evidence of mitral valve  regurgitation. No evidence of mitral stenosis.   6. The aortic valve has an indeterminant number of cusps. Aortic valve  regurgitation is not visualized. Mild aortic valve stenosis.   7. The inferior vena cava is normal in size with greater than 50%  respiratory variability, suggesting right atrial pressure of 3 mmHg.    Renal Artery [place]: 01/2022 Renal:     Right: Normal size right kidney. Normal right Resisitive Index.         Normal cortical thickness of right kidney. No evidence of         right renal artery stenosis. RRV flow present.  Left:  Normal size of left kidney. Normal left Resistive Index.         Normal cortical thickness of the left kidney. No evidence of         left renal artery stenosis. LRV flow present.  Mesenteric:  Normal Celiac artery and Superior Mesenteric artery findings.     IVC is patent.   Assessment:    No diagnosis found.   Plan:   In order of problems listed above:  ***    Shared Decision Making/Informed Consent:   {Are  you  ordering a CV Procedure (e.g. stress test, cath, DCCV, TEE, etc)?   Press F2        :[id number]}    Medication Adjustments/Labs and Tests Ordered: Current medicines are reviewed at length with the patient today.  Concerns regarding medicines are outlined above.  Medication changes, Labs and Tests ordered today are listed in the Patient Instructions below. There are no Patient Instructions on file for this visit.   Signed, Erma Heritage, PA-C  03/03/2022 1:00 PM    [facility] [address] [place], [place] [address] Phone: [phone] Fax: [phone]

## 2022-03-05 ENCOUNTER — Telehealth: Payer: Self-pay | Admitting: Cardiology

## 2022-03-05 MED ORDER — CARVEDILOL 25 MG PO TABS: 25.0000 mg | ORAL_TABLET | Freq: Two times a day (BID) | ORAL | 0 refills | Status: DC

## 2022-03-05 NOTE — Telephone Encounter (Signed)
Pt came in last week asking for refill on two of her meds, [facility] told the PT they did not receive the RX for the Carvedilol.

## 2022-03-05 NOTE — Telephone Encounter (Signed)
Completed.

## 2022-04-01 ENCOUNTER — Ambulatory Visit: Payer: Medicaid Other

## 2022-04-01 DIAGNOSIS — G4733 Obstructive sleep apnea (adult) (pediatric): Secondary | ICD-10-CM | POA: Diagnosis not present

## 2022-04-06 ENCOUNTER — Telehealth: Payer: Self-pay | Admitting: Cardiology

## 2022-04-06 ENCOUNTER — Telehealth: Payer: Self-pay

## 2022-04-06 NOTE — Telephone Encounter (Signed)
Patient is calling to discuss her ultrasound on her kidneys with Dr. Harl Bowie or nurse. Please call back to discuss with patient.

## 2022-04-06 NOTE — Telephone Encounter (Signed)
Pt made aware

## 2022-04-06 NOTE — Telephone Encounter (Signed)
Pt called stating she has an upcoming appointment with you. It looks like she is scheduled for December 18th. States she has been taking ozempic and is in need of a refill but Dr.Fanta will not refill it for her. I did not see where you had prescribed it for her. She did No Show her appointment in May.

## 2022-04-06 NOTE — Telephone Encounter (Signed)
Patient saw pcp and was told she had CRF stage 3A. Sh was worried Ozempic caused this but I reassured her this is not the case. Her A1C has dropped from 11 to 8 already. Unfortunately her diabetes can cause renal issues. She is goint to ask per pcp to refer her top nephrology.

## 2022-04-06 NOTE — Telephone Encounter (Signed)
Nope, needs appt first.

## 2022-04-12 ENCOUNTER — Telehealth: Payer: Self-pay | Admitting: Pulmonary Disease

## 2022-04-12 DIAGNOSIS — G4733 Obstructive sleep apnea (adult) (pediatric): Secondary | ICD-10-CM | POA: Diagnosis not present

## 2022-04-12 NOTE — Telephone Encounter (Signed)
Spoke with patient  regarding HST results. They verbalized understanding. No further questions. Appt scheduled for 12/6 at 11:30 with NP to discuss OSA treatment  options  Nothing further needed at this time.

## 2022-04-12 NOTE — Telephone Encounter (Signed)
HST 04/01/22 >> AHI 5.9, SpO2 low 61%.  Spent 162.9 min with SpO2 < 89%.   Please inform her that her sleep study shows mild obstructive sleep apnea with low oxygen.  Please arrange for ROV with me or NP to discuss treatment options.

## 2022-04-21 ENCOUNTER — Ambulatory Visit: Payer: Medicaid Other | Admitting: Adult Health

## 2022-04-26 ENCOUNTER — Ambulatory Visit: Payer: Medicaid Other | Admitting: Primary Care

## 2022-05-03 ENCOUNTER — Encounter: Payer: Medicaid Other | Attending: Internal Medicine | Admitting: Nutrition

## 2022-05-03 ENCOUNTER — Encounter: Payer: Self-pay | Admitting: "Endocrinology

## 2022-05-03 ENCOUNTER — Ambulatory Visit (INDEPENDENT_AMBULATORY_CARE_PROVIDER_SITE_OTHER): Payer: Medicaid Other | Admitting: "Endocrinology

## 2022-05-03 VITALS — Ht 66.0 in | Wt 225.0 lb

## 2022-05-03 VITALS — BP 170/104 | HR 72 | Ht 66.0 in | Wt 225.6 lb

## 2022-05-03 DIAGNOSIS — I10 Essential (primary) hypertension: Secondary | ICD-10-CM | POA: Diagnosis not present

## 2022-05-03 DIAGNOSIS — Z6836 Body mass index (BMI) 36.0-36.9, adult: Secondary | ICD-10-CM

## 2022-05-03 DIAGNOSIS — E1165 Type 2 diabetes mellitus with hyperglycemia: Secondary | ICD-10-CM

## 2022-05-03 DIAGNOSIS — I5022 Chronic systolic (congestive) heart failure: Secondary | ICD-10-CM | POA: Insufficient documentation

## 2022-05-03 DIAGNOSIS — E782 Mixed hyperlipidemia: Secondary | ICD-10-CM | POA: Diagnosis not present

## 2022-05-03 DIAGNOSIS — E119 Type 2 diabetes mellitus without complications: Secondary | ICD-10-CM | POA: Insufficient documentation

## 2022-05-03 DIAGNOSIS — Z794 Long term (current) use of insulin: Secondary | ICD-10-CM | POA: Insufficient documentation

## 2022-05-03 MED ORDER — LANTUS SOLOSTAR 100 UNIT/ML ~~LOC~~ SOPN: 40.0000 [IU] | PEN_INJECTOR | Freq: Every day | SUBCUTANEOUS | 2 refills | Status: DC

## 2022-05-03 NOTE — Progress Notes (Signed)
05/03/2022, 6:03 PM  Endocrinology follow-up note   Subjective:    Patient ID: Chelsey Santiago, female    DOB: 24-Jan-1976.  Chelsey Santiago is being seen in follow-up after she was seen in consultation for management of currently uncontrolled symptomatic diabetes requested by  Carrolyn Meiers, MD. She was seen by my colleague for the same consult in April 2022.  Past Medical History:  Diagnosis Date   Anemia    H&H of 10.6/33 and 07/2008 and 11.9/35 and 09/2010   Anxiety    Chronic combined systolic and diastolic CHF (congestive heart failure) ([place])    a. EF 40-45% by echo in 12/2015 with cath showing normal cors b. EF at 45% by repeat echo in 10/2018 c. EF at 30-35% in 03/2020   Depression with anxiety    Diabetes mellitus without complication ([place])    Diverticulitis    09/2020   Hypertension    Hypertensive heart disease 2009   Pulmonary edema postpartum; mild to moderate mitral regurgitation when hospitalized for CHF in 2009; Echocardiogram in 12/2009-no MR and normal EF; normal CXR in 09/2010   Migraine headache    Miscarriage 03/19/2013   Obesity 04/16/2009   Osteoarthritis, knee 03/29/2011   Preeclampsia    Pulmonary edema    Sleep apnea    Threatened abortion in early pregnancy 03/15/2013    Past Surgical History:  Procedure Laterality Date   BREAST REDUCTION SURGERY  2002   CARDIAC CATHETERIZATION N/A 12/22/2015   Procedure: Left Heart Cath and Coronary Angiography;  Surgeon: Peter M Martinique, MD;  Location: [facility];  Service: Cardiovascular;  Laterality: N/A;   CESAREAN SECTION N/A 04/09/2014   Procedure: CESAREAN SECTION;  Surgeon: Mora Bellman, MD;  Location: [facility];  Service: Obstetrics;  Laterality: N/A;   CHOLECYSTECTOMY     HYSTERECTOMY ABDOMINAL WITH SALPINGECTOMY N/A 09/26/2019   Procedure: HYSTERECTOMY ABDOMINAL WITH SALPINGECTOMY;  Surgeon: Florian Buff, MD;  Location: [facility];   Service: Gynecology;  Laterality: N/A;   LIPOMA EXCISION Right 09/26/2019   Procedure: EXCISION LIPOMA RIGHT VULVAR;  Surgeon: Florian Buff, MD;  Location: [facility];  Service: Gynecology;  Laterality: Right;    Social History   Socioeconomic History   Marital status: Married    Spouse name: Not on file   Number of children: 6   Years of education: Not on file   Highest education level: Not on file  Occupational History   Occupation: unemployed    Employer: UNEMPLOYED  Tobacco Use   Smoking status: Never   Smokeless tobacco: Never  Vaping Use   Vaping Use: Never used  Substance and Sexual Activity   Alcohol use: Yes    Comment: occ   Drug use: No   Sexual activity: Yes    Birth control/protection: Surgical    Comment: hyst  Other Topics Concern   Not on file  Social History Narrative   Lives in [place]   Engaged/boyfriend (father to youngest chid)   4 children: daughter (73 as of 2013), sons (35, 67, 69 as of 2013)   Religion: christian   Social Determinants of Radio broadcast assistant Strain: Not on file  Food Insecurity: Not on file  Transportation Needs: Not on file  Physical Activity: Not on  file  Stress: Not on file  Social Connections: Not on file    Family History  Problem Relation Age of Onset   Diabetes Mother 20   Heart disease Mother    Hyperlipidemia Paternal Grandfather    Hypertension Paternal Grandfather    Heart disease Father    Hypertension Father    Heart disease Maternal Grandmother 60   ADD / ADHD Son    Hypertension Maternal Uncle    Heart attack Brother    Sudden death Neg Hx    Colon cancer Neg Hx    Celiac disease Neg Hx    Inflammatory bowel disease Neg Hx     Outpatient Encounter Medications as of 05/03/2022  Medication Sig   insulin glargine (LANTUS SOLOSTAR) 100 UNIT/ML Solostar Pen Inject 40 Units into the skin at bedtime.   ACCU-CHEK GUIDE test strip USE AS DIRECTED TO TESTCBLOOD SUGAR TWICE DAILY.   albuterol (VENTOLIN  HFA) 108 (90 Base) MCG/ACT inhaler Inhale 1 puff into the lungs every 6 (six) hours as needed for wheezing or shortness of breath.   amLODipine (NORVASC) 10 MG tablet Take 10 mg by mouth daily.   aspirin EC 81 MG tablet Take 81 mg by mouth daily.   budesonide-formoterol (SYMBICORT) 80-4.5 MCG/ACT inhaler Inhale 2 puffs into the lungs 2 (two) times daily.   carvedilol (COREG) 25 MG tablet Take 1 tablet (25 mg total) by mouth 2 (two) times daily with a meal.   clonazePAM (KLONOPIN) 0.5 MG tablet Take 0.5 mg by mouth 2 (two) times daily.   dapagliflozin propanediol (FARXIGA) 10 MG TABS tablet Take 1 tablet (10 mg total) by mouth daily before breakfast.   gabapentin (NEURONTIN) 800 MG tablet Take 800 mg by mouth 3 (three) times daily.   hydrALAZINE (APRESOLINE) 100 MG tablet Take 100 mg by mouth 3 (three) times daily.   insulin lispro (HUMALOG) 100 UNIT/ML injection Inject 1 Units into the skin 3 (three) times daily before meals. (Patient not taking: Reported on 05/03/2022)   Ipratropium-Albuterol (COMBIVENT RESPIMAT) 20-100 MCG/ACT AERS respimat Inhale 1 puff into the lungs every 6 (six) hours as needed for wheezing or shortness of breath.   ipratropium-albuterol (DUONEB) 0.5-2.5 (3) MG/3ML SOLN Take 3 mLs by nebulization 3 (three) times daily.   isosorbide mononitrate (IMDUR) 30 MG 24 hr tablet Take 1 tablet (30 mg total) by mouth daily.   potassium chloride SA (KLOR-CON) 20 MEQ tablet Take 20 mEq by mouth daily.   sacubitril-valsartan (ENTRESTO) 97-103 MG Take 1 tablet by mouth 2 (two) times daily.   spironolactone (ALDACTONE) 25 MG tablet Take 1 tablet (25 mg total) by mouth daily.   torsemide (DEMADEX) 20 MG tablet Take 2 tablets (40 mg total) by mouth 2 (two) times daily.   traZODone (DESYREL) 100 MG tablet Take 2 tablets (200 mg total) by mouth at bedtime.   [DISCONTINUED] insulin glargine (LANTUS) 100 UNIT/ML injection Inject 0.35 mLs (35 Units total) into the skin at bedtime.   No  facility-administered encounter medications on file as of 05/03/2022.    ALLERGIES: Allergies  Allergen Reactions   Diclofenac Swelling    AND POSSIBLE SYNCOPE; tolerates ibuprofen per pt   Tramadol Nausea And Vomiting and Nausea Only    Itching (12/21); tolerates ibuprofen per pt   Vicodin [Hydrocodone-Acetaminophen] Itching and Nausea Only    VACCINATION STATUS: Immunization History  Administered Date(s) Administered   Influenza Whole 04/23/2012   Influenza,inj,Quad PF,6+ Mos 03/14/2014   Rho (D) Immune Globulin 08/07/2012  Tdap 10/11/2012, 11/02/2019    Diabetes She presents for her follow-up diabetic visit. She has type 2 diabetes mellitus. Onset time: Diagnosed at approx age of 38. There are no hypoglycemic associated symptoms. Associated symptoms include fatigue, polydipsia and polyuria. There are no hypoglycemic complications. Symptoms are stable. Diabetic complications include heart disease, nephropathy and peripheral neuropathy. Risk factors for coronary artery disease include diabetes mellitus, dyslipidemia, family history, obesity, hypertension and sedentary lifestyle. She is compliant with treatment most of the time. Her weight is fluctuating minimally (CHF). She is following a generally unhealthy diet. When asked about meal planning, she reported none. She has not had a previous visit with a dietitian. She rarely participates in exercise. (She presents today with no meter nor logs.  Her most recent A1c was 8% from April 01, 2022.  Admittedly, she is not consistent on her Lantus nor Humalog.   ) An ACE inhibitor/angiotensin II receptor blocker is being taken. She does not see a podiatrist.Eye exam is current.  Hypertension This is a chronic problem. The current episode started more than 1 year ago. The problem has been gradually improving since onset. The problem is uncontrolled. Associated symptoms include malaise/fatigue, peripheral edema and shortness of breath. There are  no associated agents to hypertension. Risk factors for coronary artery disease include diabetes mellitus, obesity, sedentary lifestyle and family history. Past treatments include ACE inhibitors, beta blockers, diuretics and central alpha agonists. The current treatment provides mild improvement. Compliance problems include exercise and diet.  Hypertensive end-organ damage includes kidney disease and CAD/MI. Identifiable causes of hypertension include chronic renal disease and sleep apnea.     Review of systems  Constitutional: + drastically fluctuating body weight r/t fluid retention,  current Body mass index is 36.41 kg/m. , + fatigue, no subjective hyperthermia, no subjective hypothermia   Objective:     BP (!) 170/104   Pulse 72   Ht '5\' 6"'$  (1.676 m)   Wt 225 lb 9.6 oz (102.3 kg)   LMP 08/21/2019   BMI 36.41 kg/m   Wt Readings from Last 3 Encounters:  05/03/22 225 lb (102.1 kg)  05/03/22 225 lb 9.6 oz (102.3 kg)  12/24/21 231 lb 3.2 oz (104.9 kg)     BP Readings from Last 3 Encounters:  05/03/22 (!) 170/104  12/24/21 (!) 140/92  10/30/21 (!) 178/130     Physical Exam- Limited  Constitutional:  Body mass index is 36.41 kg/m. , not in acute distress, normal state of mind Eyes:  EOMI, no exophthalmos Neck: Supple     CMP ( most recent) CMP     Component Value Date/Time   NA 135 07/06/2021 1331   K 3.4 (L) 07/06/2021 1331   CL 97 (L) 07/06/2021 1331   CO2 30 07/06/2021 1331   GLUCOSE 267 (H) 07/06/2021 1331   BUN 28 (H) 07/06/2021 1331   BUN 20 08/29/2020 0000   CREATININE 1.13 (H) 07/06/2021 1331   CREATININE 1.00 03/11/2016 1157   CALCIUM 9.0 07/06/2021 1331   PROT 6.8 04/04/2021 0631   ALBUMIN 2.9 (L) 04/04/2021 0631   AST 22 04/04/2021 0631   ALT 11 04/04/2021 0631   ALKPHOS 65 04/04/2021 0631   BILITOT 0.8 04/04/2021 0631   GFRNONAA >60 07/06/2021 1331   GFRAA 82 08/29/2020 0000     Diabetic Labs (most recent): Lab Results  Component Value Date    HGBA1C 9.5 (H) 04/03/2021   HGBA1C 9.8 08/29/2020   HGBA1C 10.7 (H) 06/10/2020   MICROALBUR 54 08/29/2020  Lipid Panel ( most recent) Lipid Panel     Component Value Date/Time   CHOL 211 (H) 06/03/2018 0615   TRIG 223 (H) 06/03/2018 0615   HDL 39 (L) 06/03/2018 0615   CHOLHDL 5.4 06/03/2018 0615   VLDL 45 (H) 06/03/2018 0615   LDLCALC 127 (H) 06/03/2018 0615      Lab Results  Component Value Date   TSH 0.82 08/29/2020   TSH 0.354 03/28/2020   TSH 0.719 06/05/2019   TSH 0.534 03/25/2017   TSH 1.434 08/28/2016   TSH 0.835 04/03/2016   TSH 0.48 03/11/2016   TSH 1.110 04/22/2014   TSH <0.008 (L) 10/18/2013   FREET4 1.27 (H) 06/05/2019      Assessment & Plan:   1) Uncontrolled type 2 diabetes mellitus with hyperglycemia ([place]) She presents today with no meter nor logs.  Her most recent A1c was 8% from April 01, 2022.  Admittedly, she is not consistent on her Lantus nor Humalog.    - Chelsey Santiago has currently uncontrolled symptomatic type 2 DM since 46 years of age, with most recent A1c of 8%, improving from 9.8 %.   -Recent labs reviewed.  - I had a long discussion with her about the progressive nature of diabetes and the pathology behind its complications. -her diabetes is complicated by CAD with MI, CKD, neuropathy and she remains at a high risk for more acute and chronic complications which include CAD, CVA, CKD, retinopathy, and neuropathy. These are all discussed in detail with her.  - I have counseled her on diet  and weight management by adopting a carbohydrate restricted/protein rich diet. Patient is encouraged to switch to unprocessed or minimally processed complex starch and increased protein intake (animal or plant source), fruits, and vegetables. In light of her several metabolic dysfunction parameters, she will benefit from lifestyle medicine.  - she acknowledges that there is a room for improvement in her food and drink choices. - Suggestion is  made for her to avoid simple carbohydrates  from her diet including Cakes, Sweet Desserts, Ice Cream, Soda (diet and regular), Sweet Tea, Candies, Chips, Cookies, Store Bought Juices, Alcohol , Artificial Sweeteners,  Coffee Creamer, and "Sugar-free" Products, Lemonade. This will help patient to have more stable blood glucose profile and potentially avoid unintended weight gain.  The following Lifestyle Medicine recommendations according to [place]  [facility]) were discussed and and offered to patient and she  agrees to start the journey:  A. Whole Foods, Plant-Based Nutrition comprising of fruits and vegetables, plant-based proteins, whole-grain carbohydrates was discussed in detail with the patient.   A list for source of those nutrients were also provided to the patient.  Patient will use only water or unsweetened tea for hydration. B.  The need to stay away from risky substances including alcohol, smoking; obtaining 7 to 9 hours of restorative sleep, at least 150 minutes of moderate intensity exercise weekly, the importance of healthy social connections,  and stress management techniques were discussed. C.  A full color page of  Calorie density of various food groups per pound showing examples of each food groups was provided to the patient.   - I have approached her with the following individualized plan to manage  her diabetes and patient agrees:   -This patient displacer very hesitant affect.  She will need mental health evaluation and treatment for reasonable engagement for diabetes management.    Since she has not been taking any medications, it is impractical  to proceed with intensive treatment with basal/bolus insulin.    Accordingly, I advised her to increase her Lantus to 40 units nightly, hold Humalog until her next visit.  She is advised to continue Farxiga 10 mg p.o. daily at breakfast.  Side effects and precautions discussed with her.    -She is interested  in [place].  If her insurance provides coverage, she will be considered once her compliance and consistency is assured.   I have requested and obtain a consult with a dietitian for her today.    - Specific targets for  A1c;  LDL, HDL,  and Triglycerides were discussed with the patient.  2) Blood Pressure /Hypertension: -Her blood pressure is not controlled to target.  she has enough medications and advised to be consistent in administering her medications as ordered:  Entresto 97-103 mg po daily, Demadex 80 mg po daily, Hydralazine 100 mg po TID, Coreg 50 mg po BID and Norvasc 10 mg p.o. daily with breakfast.  She has follow up with her cardiologist soon to discuss fluid retention and BP.  3) Lipids/Hyperlipidemia:    Her recent lipid panel showed uncontrolled LDL at 127.  She hesitates to go on statins.  She will be approached again if she does not engage with lifestyle medicine.    4)  Weight/Diet:  her Body mass index is 36.41 kg/m.  -  clearly complicating her diabetes care.   she is a candidate for major weight loss. I discussed with her the fact that loss of 5 - 10% of her  current body weight will have the most impact on her diabetes management.  Exercise, and detailed carbohydrates information provided  -  detailed on discharge instructions.  5) Chronic Care/Health Maintenance: -she is on ACEI/ARB and is encouraged to initiate and continue to follow up with Ophthalmology, Dentist, Podiatrist at least yearly or according to recommendations, and advised to stay away from smoking. I have recommended yearly flu vaccine and pneumonia vaccine at least every 5 years; moderate intensity exercise for up to 150 minutes weekly; and sleep for at least 7 hours a day.  - she is advised to maintain close follow up with Carrolyn Meiers, MD for primary care needs, as well as her other providers for optimal and coordinated care.  I spent 41 minutes in the care of the patient today including review  of labs from [place], Lipids, Thyroid Function, Hematology (current and previous including abstractions from other facilities); face-to-face time discussing  hypoglycemia and hyperglycemia episodes and symptoms, medications doses, her options of short and long term treatment based on the latest standards of care / guidelines;  discussion about incorporating lifestyle medicine;  and documenting the encounter. Risk reduction counseling performed per USPSTF guidelines to reduce  obesity and cardiovascular risk factors.     Please refer to Patient Instructions for Blood Glucose Monitoring and Insulin/Medications Dosing Guide"  in media tab for additional information. Please  also refer to " Patient Self Inventory" in the Media  tab for reviewed elements of pertinent patient history.  [name] participated in the discussions, expressed understanding, and voiced agreement with the above plans.  All questions were answered to her satisfaction. she is encouraged to contact clinic should she have any questions or concerns prior to her return visit.    Follow up plan: - Return in about 10 days (around 05/13/2022) for Bring Meter/CGM Device/Logs- A1c in Office, F/U with Meter/CGM Edison Simon Only - no Labs.  Rayetta Pigg, FNP-BC [facility]  [address], [place] [address] Phone: [phone] Fax: [phone]  05/03/2022, 6:03 PM

## 2022-05-03 NOTE — Patient Instructions (Signed)

## 2022-05-03 NOTE — Progress Notes (Signed)
Medical Nutrition Therapy  Appointment Start time:  3888  Appointment End time:  69  Primary concerns today: DM Type 2, Obesity  Referral diagnosis: E11.8 Preferred learning style: no preference  Learning readiness: Contemplating.    NUTRITION ASSESSMENT  46 yr old bfemale referred by Dr. Dorris Fetch, Endocrinology. Walk in. Will address safety questions next visit. PMH: DM Type 2 x 5 ys. Hiatal Hernia. On disability for CHF. She notes her mom died of CHF and DM complications at age 33.. Very tearful today. Admits to being depressed. Doesn't want to test blood sugars due to it hurting her fingers. Not sure she wants to make changes necessary to improve her DM. Admits she hasn't had any insulin in awhile. Wanted her Ozempic to help her lose weight. Went to weight loss clinic but she was too high risk for surgery. Skips eating during day and then gets hungry and thirsty at night and urinates a lot. Drinks 2-3 bottles of water per day. Doesn't eat much due to not being hungry. Can tell her BS are high when she is thirsty and hungry. Has burning sensation in her feet. Would like to find a new PCP. Current PCP is Dr. Legrand Rams. Admits to not taking her medications as prescribed due to lack of support and frustration. issues. She wants to be referred to a therapist to address her depression and life issues.   Current diet and lifestyle are contributing to her inconsistent blood sugars. She needs refill on her lantus. Complains of pain of testing her BS 4 x per day. Wants a CGM.  To see Dr. Dorris Fetch again in 10 days with BS logs.   Anthropometrics  Wt Readings from Last 3 Encounters:  05/03/22 225 lb (102.1 kg)  05/03/22 225 lb 9.6 oz (102.3 kg)  12/24/21 231 lb 3.2 oz (104.9 kg)   Ht Readings from Last 3 Encounters:  05/03/22 '5\' 6"'$  (1.676 m)  05/03/22 '5\' 6"'$  (1.676 m)  12/24/21 '5\' 6"'$  (1.676 m)   Body mass index is 36.32 kg/m. '@BMIFA'$ @ Facility age limit for growth %iles is 20 years. Facility age  limit for growth %iles is 20 years.    Clinical Medical Hx: see chart Medications: Lantus 60 units Labs:  Lab Results  Component Value Date   HGBA1C 9.5 (H) 04/03/2021      Latest Ref Rng & Units 07/06/2021    1:31 PM 04/26/2021   10:42 PM 04/09/2021    6:17 AM  CMP  Glucose 70 - 99 mg/dL 267  218  199   BUN 6 - 20 mg/dL 28  18  34   Creatinine 0.44 - 1.00 mg/dL 1.13  1.14  1.24   Sodium 135 - 145 mmol/L 135  135  134   Potassium 3.5 - 5.1 mmol/L 3.4  3.4  4.1   Chloride 98 - 111 mmol/L 97  96  99   CO2 22 - 32 mmol/L '30  29  29   '$ Calcium 8.9 - 10.3 mg/dL 9.0  8.8  8.6    Lipid Panel     Component Value Date/Time   CHOL 211 (H) 06/03/2018 0615   TRIG 223 (H) 06/03/2018 0615   HDL 39 (L) 06/03/2018 0615   CHOLHDL 5.4 06/03/2018 0615   VLDL 45 (H) 06/03/2018 0615   LDLCALC 127 (H) 06/03/2018 0615    Notable Signs/Symptoms: Increased thirst, frequent urination, burning and pain in her feet. Depressed feelings.  Lifestyle & Dietary Hx Lives with her 4 kids. On disability  for CHF.  Estimated daily fluid intake: 45 oz Supplements:  Sleep: varies Stress / self-care: a lot personal stress and being depressed Current average weekly physical activity: ADL  24-Hr Dietary Recall Eats 1-2 times per day. Drinks 3 bottles of water per day  Estimated Energy Needs Calories: 1200 Carbohydrate: 135g Protein: 90g Fat: 33g   NUTRITION DIAGNOSIS  NB-1.1 Food and nutrition-related knowledge deficit As related to Diabetes Type 2.  As evidenced by A1C 9.5.   NUTRITION INTERVENTION  Nutrition education (E-1) on the following topics:  Nutrition and Diabetes education provided on My Plate, CHO counting, meal planning, portion sizes, timing of meals, avoiding snacks between meals unless having a low blood sugar, target ranges for A1C and blood sugars, signs/symptoms and treatment of hyper/hypoglycemia, monitoring blood sugars, taking medications as prescribed, benefits of  exercising 30 minutes per day and prevention of complications of DM.  Lifestyle Medicine  - Whole Food, Plant Predominant Nutrition is highly recommended: Eat Plenty of vegetables, Mushrooms, fruits, Legumes, Whole Grains, Nuts, seeds in lieu of processed meats, processed snacks/pastries red meat, poultry, eggs.    -It is better to avoid simple carbohydrates including: Cakes, Sweet Desserts, Ice Cream, Soda (diet and regular), Sweet Tea, Candies, Chips, Cookies, Store Bought Juices, Alcohol in Excess of  1-2 drinks a day, Lemonade,  Artificial Sweeteners, Doughnuts, Coffee Creamers, "Sugar-free" Products, etc, etc.  This is not a complete list.....  Exercise: If you are able: 30 -60 minutes a day ,4 days a week, or 150 minutes a week.  The longer the better.  Combine stretch, strength, and aerobic activities.  If you were told in the past that you have high risk for cardiovascular diseases, you may seek evaluation by your heart doctor prior to initiating moderate to intense exercise programs.   Handouts Provided Include  LF Medicine handouts  Learning Style & Readiness for Change Teaching method utilized: Visual & Auditory  Demonstrated degree of understanding via: Teach Back  Barriers to learning/adherence to lifestyle change: depression  Goals Established by Pt Eat three meals per day 8 am, 12 noon and 6-7 pm.  Call to get an appt with a new MD Pick up insulin and start taking it as prescribed. Drink only water. Test blood sugars 4 times per day as best you can.   MONITORING & EVALUATION Dietary intake, weekly physical activity, and blood sugars in 10 days.  Next Steps  Patient is to get an appt with new MD and referral for a counselor.

## 2022-05-18 ENCOUNTER — Ambulatory Visit: Payer: Medicaid Other | Admitting: "Endocrinology

## 2022-05-19 ENCOUNTER — Ambulatory Visit: Payer: Medicaid Other | Admitting: Nutrition

## 2022-05-19 ENCOUNTER — Ambulatory Visit: Payer: Medicaid Other | Admitting: "Endocrinology

## 2022-07-04 ENCOUNTER — Emergency Department (HOSPITAL_COMMUNITY): Payer: Medicaid Other

## 2022-07-04 ENCOUNTER — Emergency Department (HOSPITAL_COMMUNITY)
Admission: EM | Admit: 2022-07-04 | Discharge: 2022-07-04 | Disposition: A | Discharge status: Home / Self Care | Payer: Medicaid Other | Attending: Emergency Medicine | Admitting: Emergency Medicine

## 2022-07-04 ENCOUNTER — Encounter (HOSPITAL_COMMUNITY): Payer: Self-pay | Admitting: Pharmacy Technician

## 2022-07-04 ENCOUNTER — Other Ambulatory Visit: Payer: Self-pay

## 2022-07-04 DIAGNOSIS — Z794 Long term (current) use of insulin: Secondary | ICD-10-CM | POA: Diagnosis not present

## 2022-07-04 DIAGNOSIS — Z7982 Long term (current) use of aspirin: Secondary | ICD-10-CM | POA: Insufficient documentation

## 2022-07-04 DIAGNOSIS — W19XXXA Unspecified fall, initial encounter: Secondary | ICD-10-CM | POA: Insufficient documentation

## 2022-07-04 DIAGNOSIS — M25512 Pain in left shoulder: Secondary | ICD-10-CM | POA: Insufficient documentation

## 2022-07-04 MED ORDER — KETOROLAC TROMETHAMINE 30 MG/ML IJ SOLN
15.0000 mg | Freq: Once | INTRAMUSCULAR | Status: AC
  Administered 2022-07-04: 15 mg via INTRAMUSCULAR
  Filled 2022-07-04: qty 1

## 2022-07-04 MED ORDER — PREDNISONE 20 MG PO TABS: 40.0000 mg | ORAL_TABLET | Freq: Every day | ORAL | 0 refills | Status: DC

## 2022-07-04 NOTE — ED Provider Notes (Signed)
[facility] Provider Note   CSN: [id number] Arrival date & time: 07/04/22  0840     History  Chief Complaint  Patient presents with   Lytle Michaels    Chelsey Santiago is a 47 y.o. female.  HPI     Home Medications Prior to Admission medications   Medication Sig Start Date End Date Taking? Authorizing Provider  predniSONE (DELTASONE) 20 MG tablet Take 2 tablets (40 mg total) by mouth daily with breakfast. For the next four days 07/04/22  Yes Carmin Muskrat, MD  ACCU-CHEK GUIDE test strip USE AS DIRECTED TO TESTCBLOOD SUGAR TWICE DAILY. 06/11/20   [provider]  albuterol (VENTOLIN HFA) 108 (90 Base) MCG/ACT inhaler Inhale 1 puff into the lungs every 6 (six) hours as needed for wheezing or shortness of breath. 03/30/20   Kathie Dike, MD  amLODipine (NORVASC) 10 MG tablet Take 10 mg by mouth daily. 01/06/21   [provider]  aspirin EC 81 MG tablet Take 81 mg by mouth daily.    [provider]  budesonide-formoterol (SYMBICORT) 80-4.5 MCG/ACT inhaler Inhale 2 puffs into the lungs 2 (two) times daily. 03/30/20   Kathie Dike, MD  carvedilol (COREG) 25 MG tablet Take 1 tablet (25 mg total) by mouth 2 (two) times daily with a meal. 03/05/22   Branch, Alphonse Guild, MD  clonazePAM (KLONOPIN) 0.5 MG tablet Take 0.5 mg by mouth 2 (two) times daily. 12/19/18   [provider]  dapagliflozin propanediol (FARXIGA) 10 MG TABS tablet Take 1 tablet (10 mg total) by mouth daily before breakfast. 02/20/21   Verta Ellen., NP  gabapentin (NEURONTIN) 800 MG tablet Take 800 mg by mouth 3 (three) times daily. 09/01/20   [provider]  hydrALAZINE (APRESOLINE) 100 MG tablet Take 100 mg by mouth 3 (three) times daily. 01/06/21   [provider]  insulin glargine (LANTUS SOLOSTAR) 100 UNIT/ML Solostar Pen Inject 40 Units into the skin at bedtime. 05/03/22   Cassandria Anger, MD  insulin lispro  (HUMALOG) 100 UNIT/ML injection Inject 1 Units into the skin 3 (three) times daily before meals. Patient not taking: Reported on 05/03/2022    [provider]  Ipratropium-Albuterol (COMBIVENT RESPIMAT) 20-100 MCG/ACT AERS respimat Inhale 1 puff into the lungs every 6 (six) hours as needed for wheezing or shortness of breath. 05/04/18   Manuella Ghazi, Pratik D, DO  ipratropium-albuterol (DUONEB) 0.5-2.5 (3) MG/3ML SOLN Take 3 mLs by nebulization 3 (three) times daily. 04/09/21   Orson Eva, MD  isosorbide mononitrate (IMDUR) 30 MG 24 hr tablet Take 1 tablet (30 mg total) by mouth daily. 10/30/21   Arnoldo Lenis, MD  potassium chloride SA (KLOR-CON) 20 MEQ tablet Take 20 mEq by mouth daily.    [provider]  sacubitril-valsartan (ENTRESTO) 97-103 MG Take 1 tablet by mouth 2 (two) times daily. 02/24/22   Arnoldo Lenis, MD  spironolactone (ALDACTONE) 25 MG tablet Take 1 tablet (25 mg total) by mouth daily. 10/30/21   Arnoldo Lenis, MD  torsemide (DEMADEX) 20 MG tablet Take 2 tablets (40 mg total) by mouth 2 (two) times daily. 09/09/21 09/04/22  Strader, Fransisco Hertz, PA-C  traZODone (DESYREL) 100 MG tablet Take 2 tablets (200 mg total) by mouth at bedtime. 10/29/18   Barton Dubois, MD      Allergies    Diclofenac, Tramadol, and Vicodin [hydrocodone-acetaminophen]    Review of Systems   Review of Systems  All other systems  reviewed and are negative.   Physical Exam Updated Vital Signs BP (!) 207/132   Pulse 79   Temp 97.8 F (36.6 C)   Resp 17   LMP 08/21/2019   SpO2 98%  Physical Exam Vitals and nursing note reviewed.  Constitutional:      General: She is not in acute distress.    Appearance: She is well-developed.  HENT:     Head: Normocephalic and atraumatic.  Eyes:     Conjunctiva/sclera: Conjunctivae normal.  Cardiovascular:     Pulses: Normal pulses.  Pulmonary:     Effort: Pulmonary effort is normal. No respiratory distress.     Breath sounds: Normal  breath sounds. No stridor.  Abdominal:     General: There is no distension.  Musculoskeletal:     Left shoulder: Tenderness and bony tenderness present. No swelling, deformity, effusion or crepitus. Decreased range of motion. Normal pulse.     Left elbow: Normal.     Left wrist: Normal.     Right knee: Normal.  Skin:    General: Skin is warm and dry.  Neurological:     General: No focal deficit present.     Mental Status: She is alert and oriented to person, place, and time.     Cranial Nerves: No cranial nerve deficit.  Psychiatric:        Mood and Affect: Mood normal.     ED Results / Procedures / Treatments   Labs (all labs ordered are listed, but only abnormal results are displayed) Labs Reviewed - No data to display  EKG None  Radiology DG Wrist Complete Left  Result Date: 07/04/2022 CLINICAL DATA:  [place].  Left wrist pain. EXAM: LEFT WRIST - COMPLETE 3+ VIEW COMPARISON:  None Available. FINDINGS: The joint spaces are maintained. No acute wrist or proximal hand fracture. IMPRESSION: No acute bony findings. Electronically Signed   By: Marijo Sanes M.D.   On: 07/04/2022 10:33   DG Knee Complete 4 Views Right  Result Date: 07/04/2022 CLINICAL DATA:  [place].  Right knee pain. EXAM: RIGHT KNEE - COMPLETE 4+ VIEW COMPARISON:  05/03/2017 FINDINGS: Tricompartmental degenerative changes, advanced for age and most notable at the patellofemoral joint. This appears relatively stable when compared to the prior films. No acute fracture, osteochondral lesion or significant joint effusion. IMPRESSION: 1. Tricompartmental degenerative changes, advanced for age. 2. No acute bony findings. Electronically Signed   By: Marijo Sanes M.D.   On: 07/04/2022 10:32   DG Elbow Complete Left  Result Date: 07/04/2022 CLINICAL DATA:  [place].  Left elbow pain. EXAM: LEFT ELBOW - COMPLETE 3+ VIEW COMPARISON:  None Available. FINDINGS: The joint spaces are maintained. No acute elbow fracture or osteochondral  lesion. No elbow joint effusion. IMPRESSION: No acute bony findings or joint effusion. Electronically Signed   By: Marijo Sanes M.D.   On: 07/04/2022 10:31   DG Shoulder Left  Result Date: 07/04/2022 CLINICAL DATA:  [place] yesterday EXAM: LEFT SHOULDER - 2+ VIEW COMPARISON:  None Available. FINDINGS: Examination limited by clothing artifact. No fracture or dislocation is identified. Mild calcific tendinopathy near the greater tuberosity. Small calcifications noted near the inferior margin of the glenoid could be small loose bodies in the axillary recess. The [facility] joint is intact. The visualized left ribs are intact. IMPRESSION: No fracture or dislocation. Electronically Signed   By: Marijo Sanes M.D.   On: 07/04/2022 10:30    Procedures Procedures    Medications Ordered in ED Medications  ketorolac (  TORADOL) 30 MG/ML injection 15 mg (15 mg Intramuscular Given 07/04/22 1051)    ED Course/ Medical Decision Making/ A&P                             Medical Decision Making Amount and/or Complexity of Data Reviewed Radiology: ordered.  Risk Prescription drug management.   11:03 AM Patient in no distress,, alert, sitting comfortably.  Blood pressure slightly improved.  She notes that she did take her medication this morning.  Absent distress, neuro dysfunction, no negation for advanced imaging or labs regards for blood pressure.  Vital signs are otherwise stable. I reviewed her x-rays, discussed them with her, no fracture.  No notable findings, given the passage of 1 day since the event little suspicion for internal bleeding or other acute phenomena.  Patient discharged with anti-inflammatories.        Final Clinical Impression(s) / ED Diagnoses Final diagnoses:  Fall, initial encounter    Rx / DC Orders ED Discharge Orders          Ordered    predniSONE (DELTASONE) 20 MG tablet  Daily with breakfast        07/04/22 1103              Carmin Muskrat, MD 07/04/22  1103

## 2022-07-04 NOTE — ED Notes (Signed)
Pt brought in by ems   BP 152/90 HR 88 97% RA O2  Cbg 146

## 2022-07-04 NOTE — ED Triage Notes (Signed)
Pt fell yesterday while in a laundry mat and is c/o left shoulder, left elbow and right knee pain  Pt was heard stating "I'm going to ask for some dilaudid" by ems

## 2022-07-04 NOTE — Discharge Instructions (Signed)
As discussed, it is normal to feel worse in the days immediately following a fall regardless of medication use.  However, please take all medication as directed, use ice packs liberally.  If you develop any new, or concerning changes in your condition, please return here for further evaluation and management.    Otherwise, please follow-up with your physician

## 2022-07-04 NOTE — ED Triage Notes (Signed)
Pt here via ems after fall yesterday in laundry matt. Pt reports slipping on water. Now with L elbow, L wrist, R knee, L shoulder and lower back pain.

## 2022-07-20 ENCOUNTER — Ambulatory Visit: Payer: Medicaid Other | Admitting: Orthopaedic Surgery

## 2022-07-20 ENCOUNTER — Encounter: Payer: Self-pay | Admitting: Orthopaedic Surgery

## 2022-07-20 ENCOUNTER — Ambulatory Visit (INDEPENDENT_AMBULATORY_CARE_PROVIDER_SITE_OTHER): Payer: Medicaid Other

## 2022-07-20 VITALS — BP 193/132 | HR 100 | Ht 66.0 in | Wt 225.0 lb

## 2022-07-20 DIAGNOSIS — M25511 Pain in right shoulder: Secondary | ICD-10-CM

## 2022-07-20 DIAGNOSIS — M25561 Pain in right knee: Secondary | ICD-10-CM

## 2022-07-20 MED ORDER — IBUPROFEN 600 MG PO TABS
600.0000 mg | ORAL_TABLET | Freq: Four times a day (QID) | ORAL | 5 refills | Status: DC | PRN
Start: 2022-07-20 — End: 2023-11-27

## 2022-07-20 NOTE — Progress Notes (Addendum)
Subjective:    Patient ID: Chelsey Santiago, female    DOB: June 22, 1975, 47 y.o.   MRN: [id number]  HPI She fell at local laundry mat on 07-03-22 and hurt her shoulders, her right knee, her left elbow and left wrist. She was seen in the ER. Multiple X-rays were done.  She has seen her local family physician also since then.  She has pain of the right shoulder and lateral right knee now.  She is on multiple medications but is not on any NSAID.  She has pain lifting her right shoulder over head, and sleeping on it.  She has no swelling, no numbness. The lateral right knee is tender but has minimal swelling and no giving way.  I have reviewed the ER notes and the X-rays. I have independently reviewed and interpreted x-rays of this patient done at another site by another physician or qualified health professional.    Review of Systems  Constitutional:  Positive for activity change.  Musculoskeletal:  Positive for arthralgias, gait problem and myalgias.  All other systems reviewed and are negative. For Review of Systems, all other systems reviewed and are negative.  The following is a summary of the past history medically, past history surgically, known current medicines, social history and family history.  This information is gathered electronically by the computer from prior information and documentation.  I review this each visit and have found including this information at this point in the chart is beneficial and informative.   Past Medical History:  Diagnosis Date   Anemia    H&H of 10.6/33 and 07/2008 and 11.9/35 and 09/2010   Anxiety    Chronic combined systolic and diastolic CHF (congestive heart failure) ([place])    a. EF 40-45% by echo in 12/2015 with cath showing normal cors b. EF at 45% by repeat echo in 10/2018 c. EF at 30-35% in 03/2020   Depression with anxiety    Diabetes mellitus without complication ([place])    Diverticulitis    09/2020   Hypertension    Hypertensive heart disease  2009   Pulmonary edema postpartum; mild to moderate mitral regurgitation when hospitalized for CHF in 2009; Echocardiogram in 12/2009-no MR and normal EF; normal CXR in 09/2010   Migraine headache    Miscarriage 03/19/2013   Obesity 04/16/2009   Osteoarthritis, knee 03/29/2011   Preeclampsia    Pulmonary edema    Sleep apnea    Threatened abortion in early pregnancy 03/15/2013    Past Surgical History:  Procedure Laterality Date   BREAST REDUCTION SURGERY  2002   CARDIAC CATHETERIZATION N/A 12/22/2015   Procedure: Left Heart Cath and Coronary Angiography;  Surgeon: Peter M Martinique, MD;  Location: [facility];  Service: Cardiovascular;  Laterality: N/A;   CESAREAN SECTION N/A 04/09/2014   Procedure: CESAREAN SECTION;  Surgeon: Mora Bellman, MD;  Location: [facility];  Service: Obstetrics;  Laterality: N/A;   CHOLECYSTECTOMY     HYSTERECTOMY ABDOMINAL WITH SALPINGECTOMY N/A 09/26/2019   Procedure: HYSTERECTOMY ABDOMINAL WITH SALPINGECTOMY;  Surgeon: Florian Buff, MD;  Location: [facility];  Service: Gynecology;  Laterality: N/A;   LIPOMA EXCISION Right 09/26/2019   Procedure: EXCISION LIPOMA RIGHT VULVAR;  Surgeon: Florian Buff, MD;  Location: [facility];  Service: Gynecology;  Laterality: Right;    Current Outpatient Medications on File Prior to Visit  Medication Sig Dispense Refill   ACCU-CHEK GUIDE test strip USE AS DIRECTED TO TESTCBLOOD SUGAR TWICE DAILY.     albuterol (  VENTOLIN HFA) 108 (90 Base) MCG/ACT inhaler Inhale 1 puff into the lungs every 6 (six) hours as needed for wheezing or shortness of breath. 18 g 0   amLODipine (NORVASC) 10 MG tablet Take 10 mg by mouth daily.     aspirin EC 81 MG tablet Take 81 mg by mouth daily.     budesonide-formoterol (SYMBICORT) 80-4.5 MCG/ACT inhaler Inhale 2 puffs into the lungs 2 (two) times daily. 10.2 g 0   carvedilol (COREG) 25 MG tablet Take 1 tablet (25 mg total) by mouth 2 (two) times daily with a meal. 60 tablet 0   clonazePAM (KLONOPIN)  0.5 MG tablet Take 0.5 mg by mouth 2 (two) times daily.     dapagliflozin propanediol (FARXIGA) 10 MG TABS tablet Take 1 tablet (10 mg total) by mouth daily before breakfast. 30 tablet 6   gabapentin (NEURONTIN) 800 MG tablet Take 800 mg by mouth 3 (three) times daily.     hydrALAZINE (APRESOLINE) 100 MG tablet Take 100 mg by mouth 3 (three) times daily.     insulin glargine (LANTUS SOLOSTAR) 100 UNIT/ML Solostar Pen Inject 40 Units into the skin at bedtime. 15 mL 2   insulin lispro (HUMALOG) 100 UNIT/ML injection Inject 1 Units into the skin 3 (three) times daily before meals.     Ipratropium-Albuterol (COMBIVENT RESPIMAT) 20-100 MCG/ACT AERS respimat Inhale 1 puff into the lungs every 6 (six) hours as needed for wheezing or shortness of breath. 1 Inhaler 0   ipratropium-albuterol (DUONEB) 0.5-2.5 (3) MG/3ML SOLN Take 3 mLs by nebulization 3 (three) times daily. 360 mL 1   isosorbide mononitrate (IMDUR) 30 MG 24 hr tablet Take 1 tablet (30 mg total) by mouth daily. 30 tablet 6   nabumetone (RELAFEN) 500 MG tablet Take 500 mg by mouth 2 (two) times daily.     ondansetron (ZOFRAN) 4 MG tablet Take 4 mg by mouth 4 (four) times daily.     OZEMPIC, 1 MG/DOSE, 4 MG/3ML SOPN Inject 1 mg into the skin once a week.     potassium chloride SA (KLOR-CON) 20 MEQ tablet Take 20 mEq by mouth daily.     predniSONE (DELTASONE) 20 MG tablet Take 2 tablets (40 mg total) by mouth daily with breakfast. For the next four days 8 tablet 0   sacubitril-valsartan (ENTRESTO) 97-103 MG Take 1 tablet by mouth 2 (two) times daily. 60 tablet 0   spironolactone (ALDACTONE) 25 MG tablet Take 1 tablet (25 mg total) by mouth daily. 30 tablet 6   torsemide (DEMADEX) 20 MG tablet Take 2 tablets (40 mg total) by mouth 2 (two) times daily. 360 tablet 3   traZODone (DESYREL) 100 MG tablet Take 2 tablets (200 mg total) by mouth at bedtime.     No current facility-administered medications on file prior to visit.    Social History    Socioeconomic History   Marital status: Married    Spouse name: Not on file   Number of children: 6   Years of education: Not on file   Highest education level: Not on file  Occupational History   Occupation: unemployed    Employer: UNEMPLOYED  Tobacco Use   Smoking status: Never   Smokeless tobacco: Never  Vaping Use   Vaping Use: Never used  Substance and Sexual Activity   Alcohol use: Yes    Comment: occ   Drug use: No   Sexual activity: Yes    Birth control/protection: Surgical    Comment: hyst  Other  Topics Concern   Not on file  Social History Narrative   Lives in [place]   Engaged/boyfriend (father to youngest chid)   4 children: daughter (34 as of 2013), sons (58, 28, 33 as of 2013)   Religion: christian   Social Determinants of Radio broadcast assistant Strain: Not on Art therapist Insecurity: Not on file  Transportation Needs: Not on file  Physical Activity: Not on file  Stress: Not on file  Social Connections: Not on file  Intimate Partner Violence: Not on file    Family History  Problem Relation Age of Onset   Diabetes Mother 61   Heart disease Mother    Hyperlipidemia Paternal Grandfather    Hypertension Paternal Grandfather    Heart disease Father    Hypertension Father    Heart disease Maternal Grandmother 47   ADD / ADHD Son    Hypertension Maternal Uncle    Heart attack Brother    Sudden death Neg Hx    Colon cancer Neg Hx    Celiac disease Neg Hx    Inflammatory bowel disease Neg Hx     BP (!) 193/132   Pulse 100   Ht '5\' 6"'$  (1.676 m)   Wt 225 lb (102.1 kg)   LMP 08/21/2019   BMI 36.32 kg/m   Body mass index is 36.32 kg/m.      Objective:   Physical Exam Vitals and nursing note reviewed. Exam conducted with a chaperone present.  Constitutional:      Appearance: She is well-developed.  HENT:     Head: Normocephalic and atraumatic.  Eyes:     Conjunctiva/sclera: Conjunctivae normal.     Pupils: Pupils are equal, round, and  reactive to light.  Cardiovascular:     Rate and Rhythm: Normal rate and regular rhythm.  Pulmonary:     Effort: Pulmonary effort is normal.  Abdominal:     Palpations: Abdomen is soft.  Musculoskeletal:       Arms:     Cervical back: Normal range of motion and neck supple.       Legs:  Skin:    General: Skin is warm and dry.  Neurological:     Mental Status: She is alert and oriented to person, place, and time.     Cranial Nerves: No cranial nerve deficit.     Motor: No abnormal muscle tone.     Coordination: Coordination normal.     Deep Tendon Reflexes: Reflexes are normal and symmetric. Reflexes normal.  Psychiatric:        Behavior: Behavior normal.        Thought Content: Thought content normal.        Judgment: Judgment normal.    X-rays were done of the right shoulder, reported separately.       Assessment & Plan:   Encounter Diagnoses  Name Primary?   Acute pain of right shoulder Yes   Acute pain of right knee    I will give ibuprofen 600.  She has taken before and done well on it. She has had problem with diclofenac.  I have offered injection for the shoulder and she declines.  Exercise sheet given.  Return in two weeks.  She may need MRI of the shoulder.  Call if any problem.  Precautions discussed.  Electronically Signed Sanjuana Kava, MD 3/5/202410:44 AM

## 2022-07-20 NOTE — Patient Instructions (Addendum)
 Patient Instructions: TAKE THE IBUPROFEN AS DIRECTED 1 TABLET BY MOUTH THREE TIMES DAILY Aspercreme, Biofreeze, Blue Emu or Voltaren Gel over the counter 2-3 times daily. Rub into area well each use for best results.   Follow-Up: 2 WEEKS  DR.KEELING'S SCHEDULE IS AS FOLLOWS: TUESDAY: ALL DAY WEDNESDAY: MORNING ONLY THURSDAY: MORNING ONLY PLEASE CALL OR SEND A MESSAGE VIA MYCHART BY WEDNESDAY MORNING SO THAT I CAN SEND A REQUEST TO HIM. HE LEAVES THURSDAY BY 11:30AM. HE DOES NOT RETURN TO THE OFFICE UNTIL TUESDAY MORNINGS AND HE DOES NOT CHECK HIS WORK MESSAGES DURING HIS TIME AWAY FROM THE OFFICE. I'M  AND IF YOU NEED SOMETHING, SEND ME A MYCHART MESSAGE OR CALL. MYCHART IS THE FASTEST WAY TO GET IN CONTACT WITH ME.     If you need a refill on your  medications prescribed by Dr. Luna Glasgow before your next appointment, please call your pharmacy.

## 2022-07-21 ENCOUNTER — Ambulatory Visit (INDEPENDENT_AMBULATORY_CARE_PROVIDER_SITE_OTHER): Payer: Medicaid Other | Admitting: Plastic Surgery

## 2022-07-21 ENCOUNTER — Ambulatory Visit: Payer: Medicaid Other | Admitting: Orthopaedic Surgery

## 2022-07-21 VITALS — BP 131/89 | HR 83 | Ht 66.0 in | Wt 223.2 lb

## 2022-07-21 DIAGNOSIS — R21 Rash and other nonspecific skin eruption: Secondary | ICD-10-CM

## 2022-07-21 DIAGNOSIS — M793 Panniculitis, unspecified: Secondary | ICD-10-CM | POA: Diagnosis not present

## 2022-07-21 NOTE — Progress Notes (Signed)
Referring Provider Carrolyn Meiers, MD [place],  [place] [address]   CC:  Chief Complaint  Patient presents with   Consult      Chelsey Santiago is an 47 y.o. female.  HPI: Chelsey Santiago presents today for evaluation for panniculectomy.  She states that she has had trouble with increasing rashes and discomfort due to the excess skin and fat overhanging her pannus starting with the birth of her last child approximately 7 years ago.  At that time she had per her history a significant amount of water retention and has had difficulty with weight loss ever since.  She would like to have the excess skin and fat removed.  Of note she is recently started Ozempic and has had a 15 pound weight loss with a goal weight loss of 60 pounds.  Allergies  Allergen Reactions   Diclofenac Swelling    AND POSSIBLE SYNCOPE; tolerates ibuprofen per pt   Tramadol Nausea And Vomiting and Nausea Only    Itching (12/21); tolerates ibuprofen per pt   Vicodin [Hydrocodone-Acetaminophen] Itching and Nausea Only    Outpatient Encounter Medications as of 07/21/2022  Medication Sig Note   ACCU-CHEK GUIDE test strip USE AS DIRECTED TO TESTCBLOOD SUGAR TWICE DAILY.    albuterol (VENTOLIN HFA) 108 (90 Base) MCG/ACT inhaler Inhale 1 puff into the lungs every 6 (six) hours as needed for wheezing or shortness of breath.    amLODipine (NORVASC) 10 MG tablet Take 10 mg by mouth daily. 04/03/2021: LF 01/06/2021 #30   aspirin EC 81 MG tablet Take 81 mg by mouth daily.    budesonide-formoterol (SYMBICORT) 80-4.5 MCG/ACT inhaler Inhale 2 puffs into the lungs 2 (two) times daily.    carvedilol (COREG) 25 MG tablet Take 1 tablet (25 mg total) by mouth 2 (two) times daily with a meal.    clonazePAM (KLONOPIN) 0.5 MG tablet Take 0.5 mg by mouth 2 (two) times daily.    dapagliflozin propanediol (FARXIGA) 10 MG TABS tablet Take 1 tablet (10 mg total) by mouth daily before breakfast. 04/03/2021: LF 02/20/2021  #30   gabapentin (NEURONTIN) 800 MG tablet Take 800 mg by mouth 3 (three) times daily. 04/03/2021: LF 01/12/2021 #90   hydrALAZINE (APRESOLINE) 100 MG tablet Take 100 mg by mouth 3 (three) times daily. 04/03/2021: LF 01/06/2021 #90   ibuprofen (ADVIL) 600 MG tablet Take 1 tablet (600 mg total) by mouth every 6 (six) hours as needed.    insulin glargine (LANTUS SOLOSTAR) 100 UNIT/ML Solostar Pen Inject 40 Units into the skin at bedtime.    insulin lispro (HUMALOG) 100 UNIT/ML injection Inject 1 Units into the skin 3 (three) times daily before meals.    Ipratropium-Albuterol (COMBIVENT RESPIMAT) 20-100 MCG/ACT AERS respimat Inhale 1 puff into the lungs every 6 (six) hours as needed for wheezing or shortness of breath.    ipratropium-albuterol (DUONEB) 0.5-2.5 (3) MG/3ML SOLN Take 3 mLs by nebulization 3 (three) times daily.    isosorbide mononitrate (IMDUR) 30 MG 24 hr tablet Take 1 tablet (30 mg total) by mouth daily.    nabumetone (RELAFEN) 500 MG tablet Take 500 mg by mouth 2 (two) times daily.    ondansetron (ZOFRAN) 4 MG tablet Take 4 mg by mouth 4 (four) times daily.    OZEMPIC, 1 MG/DOSE, 4 MG/3ML SOPN Inject 1 mg into the skin once a week.    potassium chloride SA (KLOR-CON) 20 MEQ tablet Take 20 mEq by mouth daily.    predniSONE (  DELTASONE) 20 MG tablet Take 2 tablets (40 mg total) by mouth daily with breakfast. For the next four days    sacubitril-valsartan (ENTRESTO) 97-103 MG Take 1 tablet by mouth 2 (two) times daily.    spironolactone (ALDACTONE) 25 MG tablet Take 1 tablet (25 mg total) by mouth daily.    torsemide (DEMADEX) 20 MG tablet Take 2 tablets (40 mg total) by mouth 2 (two) times daily.    traZODone (DESYREL) 100 MG tablet Take 2 tablets (200 mg total) by mouth at bedtime.    No facility-administered encounter medications on file as of 07/21/2022.     Past Medical History:  Diagnosis Date   Anemia    H&H of 10.6/33 and 07/2008 and 11.9/35 and 09/2010   Anxiety    Chronic  combined systolic and diastolic CHF (congestive heart failure) ([place])    a. EF 40-45% by echo in 12/2015 with cath showing normal cors b. EF at 45% by repeat echo in 10/2018 c. EF at 30-35% in 03/2020   Depression with anxiety    Diabetes mellitus without complication ([place])    Diverticulitis    09/2020   Hypertension    Hypertensive heart disease 2009   Pulmonary edema postpartum; mild to moderate mitral regurgitation when hospitalized for CHF in 2009; Echocardiogram in 12/2009-no MR and normal EF; normal CXR in 09/2010   Migraine headache    Miscarriage 03/19/2013   Obesity 04/16/2009   Osteoarthritis, knee 03/29/2011   Preeclampsia    Pulmonary edema    Sleep apnea    Threatened abortion in early pregnancy 03/15/2013    Past Surgical History:  Procedure Laterality Date   BREAST REDUCTION SURGERY  2002   CARDIAC CATHETERIZATION N/A 12/22/2015   Procedure: Left Heart Cath and Coronary Angiography;  Surgeon: Peter M Martinique, MD;  Location: [facility];  Service: Cardiovascular;  Laterality: N/A;   CESAREAN SECTION N/A 04/09/2014   Procedure: CESAREAN SECTION;  Surgeon: Mora Bellman, MD;  Location: [facility];  Service: Obstetrics;  Laterality: N/A;   CHOLECYSTECTOMY     HYSTERECTOMY ABDOMINAL WITH SALPINGECTOMY N/A 09/26/2019   Procedure: HYSTERECTOMY ABDOMINAL WITH SALPINGECTOMY;  Surgeon: Florian Buff, MD;  Location: [facility];  Service: Gynecology;  Laterality: N/A;   LIPOMA EXCISION Right 09/26/2019   Procedure: EXCISION LIPOMA RIGHT VULVAR;  Surgeon: Florian Buff, MD;  Location: [facility];  Service: Gynecology;  Laterality: Right;    Family History  Problem Relation Age of Onset   Diabetes Mother 39   Heart disease Mother    Hyperlipidemia Paternal Grandfather    Hypertension Paternal Grandfather    Heart disease Father    Hypertension Father    Heart disease Maternal Grandmother 22   ADD / ADHD Son    Hypertension Maternal Uncle    Heart attack Brother    Sudden death Neg  Hx    Colon cancer Neg Hx    Celiac disease Neg Hx    Inflammatory bowel disease Neg Hx     Social History   Social History Narrative   Lives in [place]   Engaged/boyfriend (father to youngest chid)   4 children: daughter (65 as of 2013), sons (25, 28, 17 as of 2013)   Religion: christian     Review of Systems General: Denies fevers, chills, weight loss CV: Denies chest pain, shortness of breath, palpitations Skin: Excess fat and skin on the anterior abdominal wall, occasionally has rashes on the posterior aspect of the pannus and in  the intertriginous regions  Physical Exam    07/21/2022    2:35 PM 07/20/2022    9:53 AM 07/04/2022    9:00 AM  Vitals with BMI  Height '5\' 6"'$  '5\' 6"'$    Weight 223 lbs 3 oz 225 lbs   BMI 99991111 Q000111Q   Systolic A999333 0000000 A999333  Diastolic 89 Q000111Q Q000111Q  Pulse 83 100     General:  No acute distress,  Alert and oriented, Non-Toxic, Normal speech and affect Pannus: Patient has a large heavy pannus which extends past the symphysis pubis.  She does not have any rashes today Mammogram: I cannot find a report for mammogram since 2018.  Will contact the patient and remind her she needs to obtain her mammogram. Assessment/Plan Pannus: Patient has a significant pannus and would probably benefit from a panniculectomy.  I showed her where the scars were and what the expected outcome from a panniculectomy would be.  she understands that a panniculectomy does not address any of the fat and skin above the umbilicus.  She is currently on weight loss medication and is having good results from this.  I have strongly encouraged her to continue with her weight loss prior to scheduling surgery as I believe she will have a better outcome and will be safer given her history of congestive heart failure.  Additionally if we do the panniculectomy and then she continues to lose weight she may find that she has redundant hanging skin again once her weight loss is complete she understands and  agrees.  All up with me when she has had another 15 to 30 pounds of weight loss at which time we can submit her for the panniculectomy and for scheduling of surgery.  Chelsey Santiago 07/21/2022, 4:01 PM

## 2022-07-23 ENCOUNTER — Telehealth: Payer: Self-pay

## 2022-07-23 NOTE — Telephone Encounter (Signed)
Pt asked if Dr. Lovena Le could submit order for mammogram to [facility]. She lives in [place] and would like to stay as close to home [facility]) if possible.

## 2022-07-23 NOTE — Telephone Encounter (Signed)
-----   Message from Camillia Herter, MD sent at 07/21/2022  4:10 PM EST ----- Regarding: Mammogram Ladies,  Will you please contact Ms Roorda and ask if she has had a mammogram recently? I cannot find one. If she hasn't please ask her to contact her PCP or let me know and I can order it.  Thank you, Barnabas Lister

## 2022-07-26 ENCOUNTER — Other Ambulatory Visit: Payer: Self-pay | Admitting: Plastic Surgery

## 2022-07-26 DIAGNOSIS — M793 Panniculitis, unspecified: Secondary | ICD-10-CM

## 2022-07-26 NOTE — Telephone Encounter (Signed)
Done

## 2022-08-03 ENCOUNTER — Other Ambulatory Visit (INDEPENDENT_AMBULATORY_CARE_PROVIDER_SITE_OTHER): Payer: Medicaid Other

## 2022-08-03 ENCOUNTER — Encounter: Payer: Self-pay | Admitting: Orthopaedic Surgery

## 2022-08-03 ENCOUNTER — Ambulatory Visit (INDEPENDENT_AMBULATORY_CARE_PROVIDER_SITE_OTHER): Payer: Medicaid Other | Admitting: Orthopaedic Surgery

## 2022-08-03 VITALS — BP 184/121 | HR 90 | Ht 66.0 in | Wt 222.0 lb

## 2022-08-03 DIAGNOSIS — M25511 Pain in right shoulder: Secondary | ICD-10-CM | POA: Diagnosis not present

## 2022-08-03 DIAGNOSIS — M533 Sacrococcygeal disorders, not elsewhere classified: Secondary | ICD-10-CM

## 2022-08-03 DIAGNOSIS — M25561 Pain in right knee: Secondary | ICD-10-CM

## 2022-08-03 NOTE — Patient Instructions (Signed)
 Patient Instructions: continue what you are doing now. Come back in 2 weeks   Testing/Procedures: discuss at next visit  Follow-Up:2 weeks discuss MRI then  If you need a refill on your  medications prescribed by Dr. Luna Glasgow before your next appointment, please call your pharmacy.  DR.[email or web address]'S SCHEDULE IS AS FOLLOWS: TUESDAY: ALL DAY WEDNESDAY: MORNING ONLY THURSDAY: MORNING ONLY PLEASE CALL OR SEND A MESSAGE VIA MYCHART BY WEDNESDAY MORNING SO THAT I CAN SEND A REQUEST TO HIM. HE LEAVES THURSDAY BY 11:30AM. HE DOES NOT RETURN TO THE OFFICE UNTIL TUESDAY MORNINGS AND HE DOES NOT CHECK HIS WORK MESSAGES DURING HIS TIME AWAY FROM THE OFFICE. I'M  AND IF YOU NEED SOMETHING, SEND ME A MYCHART MESSAGE OR CALL. MYCHART IS THE FASTEST WAY TO GET IN CONTACT WITH ME.

## 2022-08-03 NOTE — Progress Notes (Signed)
I hurt more in my area between my buttocks  She has developed more pain in the area of the lower sacrum.  She fell on 07-03-22 and still has pain.  Her right shoulder is still painful. She has pain with overhead use.  She has no numbness or swelling.  Lower back is negative except over the mid to lower sacrum area. There is no swelling or bruising.  NV intact.  The right shoulder has decreased ROM, forward to 130, abduction to 100 with pain.  NV intact.  ROM neck is full.  X-rays were done of the sacrum, reported separately.  Negative.  Encounter Diagnoses  Name Primary?   Pain in sacrum Yes   Acute pain of right shoulder    Acute pain of right knee    She may need MRI of the shoulder.  I will see her in two weeks and get MRI if not improved.  Continue same medicine and exercises.  Call if any problem.  Precautions discussed.  Electronically Signed Sanjuana Kava, MD 3/19/20242:19 PM

## 2022-08-17 ENCOUNTER — Ambulatory Visit: Payer: Medicaid Other | Admitting: Orthopaedic Surgery

## 2022-08-17 ENCOUNTER — Ambulatory Visit: Payer: Medicaid Other | Admitting: Pulmonary Disease

## 2022-08-24 ENCOUNTER — Ambulatory Visit: Payer: Medicaid Other | Admitting: Orthopaedic Surgery

## 2022-08-24 ENCOUNTER — Encounter: Payer: Self-pay | Admitting: Orthopaedic Surgery

## 2022-08-24 VITALS — BP 156/108 | HR 86 | Ht 66.0 in | Wt 222.0 lb

## 2022-08-24 DIAGNOSIS — M25511 Pain in right shoulder: Secondary | ICD-10-CM

## 2022-08-24 DIAGNOSIS — G8929 Other chronic pain: Secondary | ICD-10-CM | POA: Diagnosis not present

## 2022-08-24 NOTE — Patient Instructions (Signed)
While we are working on your approval for MRI please go ahead and call to schedule your appointment with [facility] within at least one (1) week.   Central Scheduling [phone]  

## 2022-08-24 NOTE — Progress Notes (Signed)
My shoulder is no better.  She has more pain of the right shoulder.  She has pain raising her hand over head and sleeping on it.  It is not improved at all.  She has no numbness, no swelling.  Right shoulder has painful ROM, abduction at 120, extension 5, pain in the extremes on other motions.  NV intact.  Encounter Diagnosis  Name Primary?   Chronic pain in right shoulder Yes   I will get MRI of the right shoulder as she is not improving with conservative care.  Return in two weeks.  Call if any problem.  Precautions discussed.  Electronically Signed Darreld Mclean, MD 4/9/202410:13 AM

## 2022-09-03 ENCOUNTER — Ambulatory Visit (HOSPITAL_COMMUNITY)
Admission: RE | Admit: 2022-09-03 | Discharge: 2022-09-03 | Disposition: A | Discharge status: Home / Self Care | Payer: Medicaid Other | Source: Ambulatory Visit | Attending: Orthopaedic Surgery | Admitting: Orthopaedic Surgery

## 2022-09-03 DIAGNOSIS — M25511 Pain in right shoulder: Secondary | ICD-10-CM | POA: Diagnosis present

## 2022-09-03 DIAGNOSIS — G8929 Other chronic pain: Secondary | ICD-10-CM | POA: Diagnosis present

## 2022-09-08 ENCOUNTER — Ambulatory Visit: Payer: Medicaid Other | Admitting: Orthopaedic Surgery

## 2022-09-13 ENCOUNTER — Telehealth: Payer: Self-pay | Admitting: Orthopaedic Surgery

## 2022-09-13 NOTE — Telephone Encounter (Signed)
Returned the patient's call, lvm for her to call back, she is wanting to rescheduled her missed appointment.

## 2022-09-21 ENCOUNTER — Ambulatory Visit: Payer: Medicaid Other | Admitting: Orthopaedic Surgery

## 2022-09-21 ENCOUNTER — Encounter: Payer: Self-pay | Admitting: Orthopaedic Surgery

## 2022-09-21 ENCOUNTER — Telehealth: Payer: Self-pay | Admitting: Orthopaedic Surgery

## 2022-09-21 VITALS — Ht 66.0 in | Wt 222.0 lb

## 2022-09-21 DIAGNOSIS — G8929 Other chronic pain: Secondary | ICD-10-CM

## 2022-09-21 DIAGNOSIS — M25511 Pain in right shoulder: Secondary | ICD-10-CM

## 2022-09-21 NOTE — Telephone Encounter (Signed)
Dr. Sanjuan Dame pt - the patient is scheduled at 10:10am this morning, she has lvm asking if Dr. Hilda Lias will discuss her results w/her over the phone, she stated she is not feeling well.  Please advise if ok to change to a phone visit.

## 2022-09-21 NOTE — Telephone Encounter (Signed)
Pt insurance doesn't cover phone visits. Please call and let pt know this and if she would like to proceed she will most likely get a bill from the office.

## 2022-09-21 NOTE — Progress Notes (Signed)
My shoulder is still hurting.  She had MRI of the right shoulder showing: IMPRESSION: 1. 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 No tendon retraction or focal muscular atrophy. 2. Mild infraspinatus tendinosis without tear. 3. The labrum and biceps tendon appear intact. 4. Mild-to-moderate acromioclavicular degenerative changes  I have explained the findings to her.  I will set up OT for the shoulder and also for her to see Dr. Dallas Schimke and get his input as a second opinion for care.  She has pain more with overhead activity and sweeping.  ROM of the right shoulder is good with pain in the extremes.  NV intact.  I have independently reviewed the MRI.    Encounter Diagnosis  Name Primary?   Chronic pain in right shoulder Yes   To go to OT.  To see Dr. Dallas Schimke.  Return to see me in four weeks depending on what Dr. Dallas Schimke says.  Call if any problem.  Precautions discussed.  Electronically Signed Darreld Mclean, MD 5/7/202410:42 AM

## 2022-09-22 ENCOUNTER — Telehealth: Payer: Self-pay | Admitting: *Deleted

## 2022-09-22 ENCOUNTER — Telehealth: Payer: Self-pay | Admitting: Cardiology

## 2022-09-22 MED ORDER — CARVEDILOL 25 MG PO TABS: 25.0000 mg | ORAL_TABLET | Freq: Two times a day (BID) | ORAL | 0 refills | Status: DC

## 2022-09-22 MED ORDER — ENTRESTO 97-103 MG PO TABS: 1.0000 | ORAL_TABLET | Freq: Two times a day (BID) | ORAL | 0 refills | Status: DC

## 2022-09-22 NOTE — Telephone Encounter (Signed)
Done

## 2022-09-22 NOTE — Telephone Encounter (Signed)
Called and spoke w/ pt instructed pt that she would need to call [facility] for those medications because we do not prescribe them as a pulmonary clinic. She verbalized that she thought our office was [facility] I provided her with their office number. NFN att.

## 2022-09-22 NOTE — Telephone Encounter (Signed)
*  STAT* If patient is at the pharmacy, call can be transferred to refill team.   1. Which medications need to be refilled? (please list name of each medication and dose if known) carvedilol (COREG) 25 MG tablet   sacubitril-valsartan (ENTRESTO) 97-103 MG   2. Which pharmacy/location (including street and city if local pharmacy) is medication to be sent to?  [facility] - [place], [place] - [address]   3. Do they need a 30 day or 90 day supply? 90

## 2022-09-22 NOTE — Telephone Encounter (Signed)
Patient called and states she would like a refill on Norvasc and another medication (I was unable to understand which). I do not see anything ordered from our office. Please call and advise patient.  Patient uses [facility]. [phone]

## 2022-09-28 ENCOUNTER — Ambulatory Visit (INDEPENDENT_AMBULATORY_CARE_PROVIDER_SITE_OTHER): Payer: Medicaid Other | Admitting: Orthopaedic Surgery

## 2022-09-28 ENCOUNTER — Encounter: Payer: Self-pay | Admitting: Orthopaedic Surgery

## 2022-09-28 VITALS — BP 184/116 | HR 84 | Ht 66.0 in | Wt 222.0 lb

## 2022-09-28 DIAGNOSIS — G8929 Other chronic pain: Secondary | ICD-10-CM

## 2022-09-28 DIAGNOSIS — M25511 Pain in right shoulder: Secondary | ICD-10-CM

## 2022-09-28 NOTE — Patient Instructions (Signed)
Follow up with Dr Dallas Schimke no charge today she needs appointment with Dr Dallas Schimke

## 2022-09-28 NOTE — Progress Notes (Signed)
She was to see Dr. Dallas Schimke and she was put on my schedule by mistake.  She is to go to OT this week, earlier than I thought she would be seen.  Keep my appointment in about three weeks.  No charge.  No treatment.  Encounter Diagnosis  Name Primary?   Chronic pain in right shoulder Yes   Electronically Signed Darreld Mclean, MD 5/14/202410:03 AM

## 2022-09-30 ENCOUNTER — Ambulatory Visit (HOSPITAL_COMMUNITY): Payer: Medicaid Other | Attending: Orthopaedic Surgery | Admitting: Occupational Therapy

## 2022-09-30 DIAGNOSIS — R29898 Other symptoms and signs involving the musculoskeletal system: Secondary | ICD-10-CM | POA: Insufficient documentation

## 2022-09-30 DIAGNOSIS — M25511 Pain in right shoulder: Secondary | ICD-10-CM | POA: Insufficient documentation

## 2022-09-30 DIAGNOSIS — M25611 Stiffness of right shoulder, not elsewhere classified: Secondary | ICD-10-CM | POA: Insufficient documentation

## 2022-10-04 ENCOUNTER — Encounter (HOSPITAL_COMMUNITY): Payer: Medicaid Other | Admitting: Occupational Therapy

## 2022-10-08 ENCOUNTER — Ambulatory Visit (HOSPITAL_COMMUNITY): Payer: Medicaid Other | Admitting: Occupational Therapy

## 2022-10-08 ENCOUNTER — Encounter (HOSPITAL_COMMUNITY): Payer: Self-pay | Admitting: Occupational Therapy

## 2022-10-08 ENCOUNTER — Other Ambulatory Visit: Payer: Self-pay

## 2022-10-08 DIAGNOSIS — M25511 Pain in right shoulder: Secondary | ICD-10-CM | POA: Diagnosis present

## 2022-10-08 DIAGNOSIS — R29898 Other symptoms and signs involving the musculoskeletal system: Secondary | ICD-10-CM

## 2022-10-08 DIAGNOSIS — M25611 Stiffness of right shoulder, not elsewhere classified: Secondary | ICD-10-CM | POA: Diagnosis present

## 2022-10-08 NOTE — Therapy (Signed)
OUTPATIENT OCCUPATIONAL THERAPY ORTHO EVALUATION  Patient Name: Chelsey Santiago MRN: [id number] DOB:1975/07/07, 47 y.o., female Today's Date: 10/08/2022   END OF SESSION:  OT End of Session - 10/08/22 1126     Visit Number 1    Number of Visits 6    Date for OT Re-Evaluation 11/19/22    Authorization Type [employer]    Authorization Time Period 30 visit limit    Authorization - Visit Number 1    Authorization - Number of Visits 30    OT Start Time 1029    OT Stop Time 1100    OT Time Calculation (min) 31 min    Activity Tolerance Patient tolerated treatment well    Behavior During Therapy WFL for tasks assessed/performed             Past Medical History:  Diagnosis Date   Anemia    H&H of 10.6/33 and 07/2008 and 11.9/35 and 09/2010   Anxiety    Chronic combined systolic and diastolic CHF (congestive heart failure) (HCC)    a. EF 40-45% by echo in 12/2015 with cath showing normal cors b. EF at 45% by repeat echo in 10/2018 c. EF at 30-35% in 03/2020   Depression with anxiety    Diabetes mellitus without complication (HCC)    Diverticulitis    09/2020   Hypertension    Hypertensive heart disease 2009   Pulmonary edema postpartum; mild to moderate mitral regurgitation when hospitalized for CHF in 2009; Echocardiogram in 12/2009-no MR and normal EF; normal CXR in 09/2010   Migraine headache    Miscarriage 03/19/2013   Obesity 04/16/2009   Osteoarthritis, knee 03/29/2011   Preeclampsia    Pulmonary edema    Sleep apnea    Threatened abortion in early pregnancy 03/15/2013   Past Surgical History:  Procedure Laterality Date   BREAST REDUCTION SURGERY  2002   CARDIAC CATHETERIZATION N/A 12/22/2015   Procedure: Left Heart Cath and Coronary Angiography;  Surgeon: Peter M Swaziland, MD;  Location: [facility] INVASIVE CV LAB;  Service: Cardiovascular;  Laterality: N/A;   CESAREAN SECTION N/A 04/09/2014   Procedure: CESAREAN SECTION;  Surgeon: Catalina Antigua, MD;  Location: [facility];   Service: Obstetrics;  Laterality: N/A;   CHOLECYSTECTOMY     HYSTERECTOMY ABDOMINAL WITH SALPINGECTOMY N/A 09/26/2019   Procedure: HYSTERECTOMY ABDOMINAL WITH SALPINGECTOMY;  Surgeon: Lazaro Arms, MD;  Location: [facility];  Service: Gynecology;  Laterality: N/A;   LIPOMA EXCISION Right 09/26/2019   Procedure: EXCISION LIPOMA RIGHT VULVAR;  Surgeon: Lazaro Arms, MD;  Location: [facility];  Service: Gynecology;  Laterality: Right;   Patient Active Problem List   Diagnosis Date Noted   Mixed hyperlipidemia 05/03/2022   Influenza A 04/03/2021   Prolonged QT interval 04/03/2021   Hyponatremia 04/03/2021   Elevated brain natriuretic peptide (BNP) level 04/03/2021   History of diverticulitis 01/08/2021   Vomiting 10/18/2020   Acute diverticulitis 10/10/2020   Chronic anemia 10/07/2020   Pneumonia due to COVID-19 virus 06/09/2020   Acute on chronic respiratory failure with hypoxia (HCC) 06/09/2020   Obstructive sleep apnea 03/29/2020   Flash pulmonary edema (HCC) 03/27/2020   Back pain 12/19/2019   Vaginal discharge 12/19/2019   Vaginal itching 12/19/2019   BV (bacterial vaginosis) 12/19/2019   Chronic hypertension 12/19/2019   Fibroids 09/26/2019   S/P hysterectomy 09/26/2019   Acute blood loss anemia 09/26/2019   Pain of upper abdomen 03/14/2019   Dysphagia 03/14/2019   Gastroesophageal reflux disease 03/14/2019  Obesity, Class II, BMI 35-39.9    Acute exacerbation of CHF (congestive heart failure) (HCC) 10/26/2018   Chronic combined systolic and diastolic CHF (congestive heart failure) (HCC) 06/02/2018   Uncontrolled type 2 diabetes mellitus with hyperglycemia (HCC) 06/02/2018   Hyperglycemia due to diabetes mellitus (HCC) 06/02/2018   Influenza B 05/13/2018   Hypomagnesemia 05/12/2018   Headache 05/12/2018   Upper respiratory tract infection    HCAP (healthcare-associated pneumonia)    Diarrhea 10/17/2017   Bad headache    CHF exacerbation (HCC) 09/29/2017   Iron deficiency  anemia 05/16/2017   Vitamin D deficiency 11/25/2016   Iron deficiency 11/25/2016   History of acute myocardial infarction 10/05/2016   CHF (congestive heart failure) (HCC) 05/06/2016   Diabetes mellitus with complication (HCC) 05/06/2016   Leukocytosis 05/06/2016   Neuropathy 05/06/2016   Depression 04/16/2016   Chronic tension-type headache, not intractable 04/16/2016   AKI (acute kidney injury) (HCC)    Hyperkalemia    Nonischemic cardiomyopathy (HCC)    Acute on chronic combined systolic and diastolic ACC/AHA stage C congestive heart failure (HCC) 04/03/2016   Acute on chronic combined systolic and diastolic CHF, NYHA class 4 (HCC) 04/03/2016   Hypertensive emergency 04/03/2016   Cardiomyopathy due to hypertension (HCC) 12/22/2015   Normal coronary arteries 12/22/2015   Troponin level elevated 12/22/2015   NSTEMI (non-ST elevated myocardial infarction) (HCC) 12/20/2015   Dental infection 10/10/2015   Chest pain 09/05/2015   Systolic CHF, chronic (HCC) 09/05/2015   Left lower quadrant abdominal pain    Type 2 diabetes mellitus without complication (HCC) 04/28/2014   Essential hypertension    Resistant hypertension 04/23/2014   Hypertensive urgency 04/22/2014   Acute CHF (HCC) 04/22/2014   S/P cesarean section 04/11/2014   Acute pulmonary edema (HCC) 04/11/2014   Postoperative anemia 04/11/2014   Elevated serum creatinine 04/11/2014   Preeclampsia, severe 04/09/2014   Pre-eclampsia superimposed on chronic hypertension, antepartum 04/08/2014   Dyspnea 04/08/2014   Polyhydramnios in third trimester, antepartum 03/14/2014   Abnormal maternal glucose tolerance, antepartum 03/11/2014   High-risk pregnancy 03/11/2014   Pre-existing essential hypertension complicating pregnancy 03/11/2014   Impaired glucose tolerance during pregnancy, antepartum 11/27/2013   Leiomyoma of uterus 11/22/2013   History of gestational diabetes in prior pregnancy, currently pregnant in first trimester  11/22/2013   Hx of preeclampsia, prior pregnancy, currently pregnant 11/22/2013   Short interval between pregnancies affecting pregnancy, antepartum 11/22/2013   Supervision of high-risk pregnancy of elderly primigravida (>= 22 years old at delivery), third trimester 11/22/2013   Miscarriage 03/19/2013   Major depressive disorder, single episode, unspecified 09/27/2011   Hypertension    Hypertensive cardiovascular disease    Microcytic anemia    Osteoarthrosis involving lower leg 03/29/2011   Hypokalemia 12/12/2009   OSA on CPAP 12/09/2009   Class 2 severe obesity due to excess calories with serious comorbidity and body mass index (BMI) of 36.0 to 36.9 in adult [facility]) 04/16/2009    PCP: Tresa Res, FNP REFERRING PROVIDER: Dr. Darreld Mclean  ONSET DATE: February 2024  REFERRING DIAG: Chronic right shoulder pain  THERAPY DIAG:  Acute pain of right shoulder  Stiffness of right shoulder, not elsewhere classified  Other symptoms and signs involving the musculoskeletal system  Rationale for Evaluation and Treatment: Rehabilitation  SUBJECTIVE:   SUBJECTIVE STATEMENT: S: It's actually both of my shoulders.  Pt accompanied by: self  PERTINENT HISTORY: Pt is a 47 y/o female s/p fall at a laundry mat in 06/2022 during which she landed  on her right arm with elbow braced against the concrete and braced with her LUE. Pt has had pain since this time, had an MRI which showed a partial intrasubstance insertional tear of the supraspinatus tendon anteriorly with probable extension to the bursal surface and mild infraspinatus tendinosis. Pt reports she is supposed to follow up with Dr. Dallas Schimke for a second opinion regarding possible surgical repair.   PRECAUTIONS: None  WEIGHT BEARING RESTRICTIONS: No  PAIN:  Are you having pain? Yes: NPRS scale: 9/10 Pain location: right shoulder Pain description: sore, stabbing with movement Aggravating factors: sleeping on it, movement Relieving  factors: ibuprofen  FALLS: Has patient fallen in last 6 months? Yes. Number of falls 1  PLOF: Independent  PATIENT GOALS: To have less pain in the right arm.   NEXT MD VISIT: None scheduled  OBJECTIVE:   HAND DOMINANCE: Right  ADLs: Overall ADLs: Sleeping is very difficulty as pt is a stomach sleeper. Pt has a lot of difficulty with dressing and bathing, reaching behind her back. Pt has difficulty with functional reaching.   UPPER EXTREMITY ROM:       Assessed in sitting, er/IR adducted  Active ROM Right eval  Shoulder flexion 99  Shoulder abduction 87  Shoulder internal rotation 90  Shoulder external rotation 50  (Blank rows = not tested)     Passive ROM Right eval  Shoulder flexion 105  Shoulder abduction 99  Shoulder internal rotation 90  Shoulder external rotation 72  (Blank rows = not tested)  UPPER EXTREMITY MMT:     Assessed in sitting, er/IR adducted, observation due to pain and limited ROM  MMT Right eval  Shoulder flexion 3/5  Shoulder abduction 3/5  Shoulder internal rotation 3/5  Shoulder external rotation 3/5  (Blank rows = not tested)  SENSATION: Has tingling in the hands-unsure of due to neuropathy or due to the fall  EDEMA: None noticed  COGNITION: Overall cognitive status: Within functional limits for tasks assessed  OBSERVATIONS: Mod fascial restrictions along right upper arm, anterior shoulder, and trapezius regions   TODAY'S TREATMENT:                                                                                                                              DATE: N/A-eval only     PATIENT EDUCATION: Education details: table slides, scapular A/ROM Person educated: Patient Education method: Explanation, Facilities manager, and Handouts Education comprehension: verbalized understanding  HOME EXERCISE PROGRAM: Eval: table slides, scapular A/ROM  GOALS: Goals reviewed with patient? Yes   SHORT TERM GOALS: Target date:  10/29/22  Pt will be provided with and educated on HEP to improve mobility in RUE required for use during ADL completion.   Goal status: INITIAL  2.  Pt will increase RUE P/ROM by 45 degrees to improve ability to use RUE during dressing tasks with minimal compensatory techniques.   Goal status: INITIAL    LONG TERM GOALS: Target date: 11/19/22  Pt will decrease pain in RUE to 5/10 or less to improve ability to sleep for 2+ consecutive hours without waking due to pain.   Goal status: INITIAL  2.  Pt will decrease RUE fascial restrictions to min amounts or less to improve mobility required for functional reaching tasks.   Goal status: INITIAL  3.  Pt will increase RUE A/ROM by 45 degrees to improve ability to use RUE when reaching overhead or behind back during dressing and bathing tasks.   Goal status: INITIAL  4.  Pt will increase RUE strength to 4/5 or greater to improve ability to use RUE when lifting or carrying items during meal preparation/housework/yardwork tasks.   Goal status: INITIAL    ASSESSMENT:  CLINICAL IMPRESSION: Patient is a 47 y.o. female who was seen today for occupational therapy evaluation for right shoulder pain s/p fall in 06/2022. MRI confirmed RC tear. Pt presents with increased pain and fascial restrictions, decreased ROM, strength, and functional use of the RUE. Pt reports she has not been able to reach overhead since February. Discussed conservative treatment while waiting on second opinion regarding possible surgical repair. Pt with a 30 visit limit for the year from insurance.   PERFORMANCE DEFICITS: in functional skills including in functional skills including ADLs, IADLs, coordination, tone, ROM, strength, pain, fascial restrictions, muscle spasms, and UE functional use  IMPAIRMENTS: are limiting patient from ADLs, IADLs, rest and sleep, work, and leisure.   COMORBIDITIES: has no other co-morbidities that affects occupational performance. Patient  will benefit from skilled OT to address above impairments and improve overall function.  MODIFICATION OR ASSISTANCE TO COMPLETE EVALUATION: No modification of tasks or assist necessary to complete an evaluation.  OT OCCUPATIONAL PROFILE AND HISTORY: Problem focused assessment: Including review of records relating to presenting problem.  CLINICAL DECISION MAKING: LOW - limited treatment options, no task modification necessary  REHAB POTENTIAL: Good  EVALUATION COMPLEXITY: Low      PLAN:  OT FREQUENCY: 1-2x/week  OT DURATION: 6 weeks  PLANNED INTERVENTIONS: self care/ADL training, therapeutic exercise, therapeutic activity, neuromuscular re-education, manual therapy, passive range of motion, splinting, electrical stimulation, ultrasound, moist heat, cryotherapy, patient/family education, and DME and/or AE instructions  RECOMMENDED OTHER SERVICES: None at this time  CONSULTED AND AGREED WITH PLAN OF CARE: Patient  PLAN FOR NEXT SESSION: Follow up on HEP, initiate manual techniques, AA/ROM   Ezra Sites, OTR/L  [phone] 10/08/2022, 11:27 AM

## 2022-10-08 NOTE — Patient Instructions (Signed)
1) Seated Row   Sit up straight with elbows by your sides. Pull back with shoulders/elbows, keeping forearms straight, as if pulling back on the reins of a horse. Squeeze shoulder blades together. Repeat _10-15__times, __2-3__sets/day    2) Shoulder Elevation    Sit up straight with arms by your sides. Slowly bring your shoulders up towards your ears. Repeat_10-15__times, __2-3__ sets/day    3) Shoulder Extension    Sit up straight with both arms by your side, draw your arms back behind your waist. Keep your elbows straight. Repeat __10-15__times, __2-3__sets/day.   1) SHOULDER: Flexion On Table   Place hands on towel placed on table, elbows straight. Lean forward with you upper body, pushing towel away from body.  10 reps per set, 2-3 sets per day  2) Abduction (Passive)   With arm out to side, resting on towel placed on table with palm DOWN, keeping trunk away from table, lean to the side while pushing towel away from body.  Repeat 10 times. Do 2-3 sessions per day.  Copyright  VHI. All rights reserved.     3) Internal Rotation (Assistive)   Seated with elbow bent at right angle and held against side, slide arm on table surface in an inward arc keeping elbow anchored in place. Repeat 10 times. Do 2-3 sessions per day. Activity: Use this motion to brush crumbs off the table.  Copyright  VHI. All rights reserved.

## 2022-10-13 ENCOUNTER — Ambulatory Visit: Payer: Medicaid Other | Admitting: Orthopaedic Surgery

## 2022-10-13 ENCOUNTER — Encounter: Payer: Self-pay | Admitting: Orthopaedic Surgery

## 2022-10-13 VITALS — BP 204/133 | HR 98 | Ht 66.0 in | Wt 225.0 lb

## 2022-10-13 DIAGNOSIS — M5442 Lumbago with sciatica, left side: Secondary | ICD-10-CM | POA: Diagnosis not present

## 2022-10-13 DIAGNOSIS — G8929 Other chronic pain: Secondary | ICD-10-CM

## 2022-10-13 MED ORDER — HYDROCODONE-ACETAMINOPHEN 5-325 MG PO TABS: ORAL_TABLET | ORAL | 0 refills | Status: DC

## 2022-10-13 NOTE — Progress Notes (Signed)
My back is worse.  She has more pain of the lower back and left sided sciatica.  She is uncomfortable most of the time.  She has no new trauma.  She has taken her medicine and has done her exercises.  She has no spasm.  Nothing seems to help.  Lower back is tender.  She has no spasm.  ROM is limited secondary to pain.  SLR negative. NV intact. Gait is good but slow.  She is in pain.  Encounter Diagnosis  Name Primary?   Chronic left-sided low back pain with left-sided sciatica Yes   I will get MRI of the lumbar spine.  She wants a walker and I have given Rx.  I have reviewed the [place] Controlled Substance Reporting System web site prior to prescribing narcotic medicine for this patient.  I have given hydrocodone.  Computer lists as possible allergy but she had this in February from another doctor.  Return in two weeks.  Call if any problem.  Precautions discussed.  Electronically Signed Darreld Mclean, MD 5/29/202410:11 AM

## 2022-10-15 ENCOUNTER — Ambulatory Visit (HOSPITAL_COMMUNITY): Payer: Medicaid Other

## 2022-10-15 ENCOUNTER — Encounter (HOSPITAL_COMMUNITY): Payer: Medicaid Other | Admitting: Occupational Therapy

## 2022-10-22 ENCOUNTER — Ambulatory Visit (HOSPITAL_COMMUNITY): Payer: Medicaid Other | Attending: Orthopaedic Surgery | Admitting: Occupational Therapy

## 2022-10-22 ENCOUNTER — Encounter (HOSPITAL_COMMUNITY): Payer: Self-pay | Admitting: Occupational Therapy

## 2022-10-22 DIAGNOSIS — M25611 Stiffness of right shoulder, not elsewhere classified: Secondary | ICD-10-CM | POA: Insufficient documentation

## 2022-10-22 DIAGNOSIS — M25511 Pain in right shoulder: Secondary | ICD-10-CM | POA: Insufficient documentation

## 2022-10-22 DIAGNOSIS — Z01810 Encounter for preprocedural cardiovascular examination: Secondary | ICD-10-CM | POA: Insufficient documentation

## 2022-10-22 DIAGNOSIS — I350 Nonrheumatic aortic (valve) stenosis: Secondary | ICD-10-CM | POA: Insufficient documentation

## 2022-10-22 DIAGNOSIS — G4733 Obstructive sleep apnea (adult) (pediatric): Secondary | ICD-10-CM | POA: Diagnosis present

## 2022-10-22 DIAGNOSIS — I5022 Chronic systolic (congestive) heart failure: Secondary | ICD-10-CM | POA: Insufficient documentation

## 2022-10-22 DIAGNOSIS — I1A0 Resistant hypertension: Secondary | ICD-10-CM | POA: Insufficient documentation

## 2022-10-22 DIAGNOSIS — R29898 Other symptoms and signs involving the musculoskeletal system: Secondary | ICD-10-CM | POA: Diagnosis present

## 2022-10-22 NOTE — Therapy (Signed)
OUTPATIENT OCCUPATIONAL THERAPY ORTHO TREATMENT  Patient Name: Chelsey Santiago MRN: [id number] DOB:[birth year], 47 y.o., female Today's Date: 10/22/2022   END OF SESSION:  OT End of Session - 10/22/22 1101     Visit Number 2    Number of Visits 6    Date for OT Re-Evaluation 11/19/22    Authorization Type [employer] Medicaid    Authorization Time Period 30 visit limit PT/OT/ST combined    Authorization - Visit Number 2    Authorization - Number of Visits 30    OT Start Time 1034    OT Stop Time 1112    OT Time Calculation (min) 38 min    Activity Tolerance Patient tolerated treatment well    Behavior During Therapy WFL for tasks assessed/performed              Past Medical History:  Diagnosis Date   Anemia    H&H of 10.6/33 and 07/2008 and 11.9/35 and 09/2010   Anxiety    Chronic combined systolic and diastolic CHF (congestive heart failure) (HCC)    a. EF 40-45% by echo in 12/2015 with cath showing normal cors b. EF at 45% by repeat echo in 10/2018 c. EF at 30-35% in 03/2020   Depression with anxiety    Diabetes mellitus without complication (HCC)    Diverticulitis    09/2020   Hypertension    Hypertensive heart disease 2009   Pulmonary edema postpartum; mild to moderate mitral regurgitation when hospitalized for CHF in 2009; Echocardiogram in 12/2009-no MR and normal EF; normal CXR in 09/2010   Migraine headache    Miscarriage 03/19/2013   Obesity 04/16/2009   Osteoarthritis, knee 03/29/2011   Preeclampsia    Pulmonary edema    Sleep apnea    Threatened abortion in early pregnancy 03/15/2013   Past Surgical History:  Procedure Laterality Date   BREAST REDUCTION SURGERY  2002   CARDIAC CATHETERIZATION N/A 12/22/2015   Procedure: Left Heart Cath and Coronary Angiography;  Surgeon: Peter M Swaziland, MD;  Location: [facility] INVASIVE CV LAB;  Service: Cardiovascular;  Laterality: N/A;   CESAREAN SECTION N/A 04/09/2014   Procedure: CESAREAN SECTION;  Surgeon: Catalina Antigua, MD;   Location: [facility];  Service: Obstetrics;  Laterality: N/A;   CHOLECYSTECTOMY     HYSTERECTOMY ABDOMINAL WITH SALPINGECTOMY N/A 09/26/2019   Procedure: HYSTERECTOMY ABDOMINAL WITH SALPINGECTOMY;  Surgeon: Lazaro Arms, MD;  Location: [facility];  Service: Gynecology;  Laterality: N/A;   LIPOMA EXCISION Right 09/26/2019   Procedure: EXCISION LIPOMA RIGHT VULVAR;  Surgeon: Lazaro Arms, MD;  Location: [facility];  Service: Gynecology;  Laterality: Right;   Patient Active Problem List   Diagnosis Date Noted   Mixed hyperlipidemia 05/03/2022   Influenza A 04/03/2021   Prolonged QT interval 04/03/2021   Hyponatremia 04/03/2021   Elevated brain natriuretic peptide (BNP) level 04/03/2021   History of diverticulitis 01/08/2021   Vomiting 10/18/2020   Acute diverticulitis 10/10/2020   Chronic anemia 10/07/2020   Pneumonia due to COVID-19 virus 06/09/2020   Acute on chronic respiratory failure with hypoxia (HCC) 06/09/2020   Obstructive sleep apnea 03/29/2020   Flash pulmonary edema (HCC) 03/27/2020   Back pain 12/19/2019   Vaginal discharge 12/19/2019   Vaginal itching 12/19/2019   BV (bacterial vaginosis) 12/19/2019   Chronic hypertension 12/19/2019   Fibroids 09/26/2019   S/P hysterectomy 09/26/2019   Acute blood loss anemia 09/26/2019   Pain of upper abdomen 03/14/2019   Dysphagia 03/14/2019   Gastroesophageal reflux disease  03/14/2019   Obesity, Class II, BMI 35-39.9    Acute exacerbation of CHF (congestive heart failure) (HCC) 10/26/2018   Chronic combined systolic and diastolic CHF (congestive heart failure) (HCC) 06/02/2018   Uncontrolled type 2 diabetes mellitus with hyperglycemia (HCC) 06/02/2018   Hyperglycemia due to diabetes mellitus (HCC) 06/02/2018   Influenza B 05/13/2018   Hypomagnesemia 05/12/2018   Headache 05/12/2018   Upper respiratory tract infection    HCAP (healthcare-associated pneumonia)    Diarrhea 10/17/2017   Bad headache    CHF exacerbation (HCC) 09/29/2017    Iron deficiency anemia 05/16/2017   Vitamin D deficiency 11/25/2016   Iron deficiency 11/25/2016   History of acute myocardial infarction 10/05/2016   CHF (congestive heart failure) (HCC) 05/06/2016   Diabetes mellitus with complication (HCC) 05/06/2016   Leukocytosis 05/06/2016   Neuropathy 05/06/2016   Depression 04/16/2016   Chronic tension-type headache, not intractable 04/16/2016   AKI (acute kidney injury) (HCC)    Hyperkalemia    Nonischemic cardiomyopathy (HCC)    Acute on chronic combined systolic and diastolic ACC/AHA stage C congestive heart failure (HCC) 04/03/2016   Acute on chronic combined systolic and diastolic CHF, NYHA class 4 (HCC) 04/03/2016   Hypertensive emergency 04/03/2016   Cardiomyopathy due to hypertension (HCC) 12/22/2015   Normal coronary arteries 12/22/2015   Troponin level elevated 12/22/2015   NSTEMI (non-ST elevated myocardial infarction) (HCC) 12/20/2015   Dental infection 10/10/2015   Chest pain 09/05/2015   Systolic CHF, chronic (HCC) 09/05/2015   Left lower quadrant abdominal pain    Type 2 diabetes mellitus without complication (HCC) 04/28/2014   Essential hypertension    Resistant hypertension 04/23/2014   Hypertensive urgency 04/22/2014   Acute CHF (HCC) 04/22/2014   S/P cesarean section 04/11/2014   Acute pulmonary edema (HCC) 04/11/2014   Postoperative anemia 04/11/2014   Elevated serum creatinine 04/11/2014   Preeclampsia, severe 04/09/2014   Pre-eclampsia superimposed on chronic hypertension, antepartum 04/08/2014   Dyspnea 04/08/2014   Polyhydramnios in third trimester, antepartum 03/14/2014   Abnormal maternal glucose tolerance, antepartum 03/11/2014   High-risk pregnancy 03/11/2014   Pre-existing essential hypertension complicating pregnancy 03/11/2014   Impaired glucose tolerance during pregnancy, antepartum 11/27/2013   Leiomyoma of uterus 11/22/2013   History of gestational diabetes in prior pregnancy, currently pregnant in  first trimester 11/22/2013   Hx of preeclampsia, prior pregnancy, currently pregnant 11/22/2013   Short interval between pregnancies affecting pregnancy, antepartum 11/22/2013   Supervision of high-risk pregnancy of elderly primigravida (>= 81 years old at delivery), third trimester 11/22/2013   Miscarriage 03/19/2013   Major depressive disorder, single episode, unspecified 09/27/2011   Hypertension    Hypertensive cardiovascular disease    Microcytic anemia    Osteoarthrosis involving lower leg 03/29/2011   Hypokalemia 12/12/2009   OSA on CPAP 12/09/2009   Class 2 severe obesity due to excess calories with serious comorbidity and body mass index (BMI) of 36.0 to 36.9 in adult [facility]) 04/16/2009    PCP: Tresa Res, FNP REFERRING PROVIDER: Dr. Darreld Mclean  ONSET DATE: February 2024  REFERRING DIAG: Chronic right shoulder pain  THERAPY DIAG:  Acute pain of right shoulder  Stiffness of right shoulder, not elsewhere classified  Other symptoms and signs involving the musculoskeletal system  Rationale for Evaluation and Treatment: Rehabilitation  SUBJECTIVE:   SUBJECTIVE STATEMENT: S: "The home exercises are doing pretty good."  PERTINENT HISTORY: Pt is a 47 y/o female s/p fall at a laundry mat in 06/2022 during which she landed on  her right arm with elbow braced against the concrete and braced with her LUE. Pt has had pain since this time, had an MRI which showed a partial intrasubstance insertional tear of the supraspinatus tendon anteriorly with probable extension to the bursal surface and mild infraspinatus tendinosis. Pt reports she is supposed to follow up with Dr. Dallas Schimke for a second opinion regarding possible surgical repair.   PRECAUTIONS: None  WEIGHT BEARING RESTRICTIONS: No  PAIN:  Are you having pain? Yes: NPRS scale: 7/10 Pain location: right shoulder Pain description: sore, stabbing with movement Aggravating factors: sleeping on it, movement Relieving  factors: ibuprofen  FALLS: Has patient fallen in last 6 months? Yes. Number of falls 1  PLOF: Independent  PATIENT GOALS: To have less pain in the right arm.   NEXT MD VISIT: None scheduled  OBJECTIVE:   HAND DOMINANCE: Right  ADLs: Overall ADLs: Sleeping is very difficulty as pt is a stomach sleeper. Pt has a lot of difficulty with dressing and bathing, reaching behind her back. Pt has difficulty with functional reaching.   UPPER EXTREMITY ROM:       Assessed in sitting, er/IR adducted  Active ROM Right eval  Shoulder flexion 99  Shoulder abduction 87  Shoulder internal rotation 90  Shoulder external rotation 50  (Blank rows = not tested)     Passive ROM Right eval  Shoulder flexion 105  Shoulder abduction 99  Shoulder internal rotation 90  Shoulder external rotation 72  (Blank rows = not tested)  UPPER EXTREMITY MMT:     Assessed in sitting, er/IR adducted, observation due to pain and limited ROM  MMT Right eval  Shoulder flexion 3/5  Shoulder abduction 3/5  Shoulder internal rotation 3/5  Shoulder external rotation 3/5  (Blank rows = not tested)  SENSATION: Has tingling in the hands-unsure of due to neuropathy or due to the fall  EDEMA: None noticed  COGNITION: Overall cognitive status: Within functional limits for tasks assessed  OBSERVATIONS: Mod fascial restrictions along right upper arm, anterior shoulder, and trapezius regions   TODAY'S TREATMENT:                                                                                                                              DATE:  10/22/22 -Myofascial release to right upper arm, anterior shoulder, trapezius, and scapular regions to decrease pain and fascial restrictions and increase joint ROM in RUE -P/ROM: supine-flexion, abduction, er/IR, horizontal abduction, 5 reps -A/ROM: supine-protraction, flexion, er/IR, horizontal abduction, abduction, 10 reps -A/ROM: seated-protraction, flexion, er/IR,  horizontal abduction, abduction, 5 reps  PATIENT EDUCATION: Education details: A/ROM Person educated: Patient Education method: Programmer, multimedia, Facilities manager, and Handouts Education comprehension: verbalized understanding  HOME EXERCISE PROGRAM: Eval: table slides, scapular A/ROM 6/7: A/ROM  GOALS: Goals reviewed with patient? Yes   SHORT TERM GOALS: Target date: 10/29/22  Pt will be provided with and educated on HEP to improve mobility in RUE required for use during ADL  completion.   Goal status: IN PROGRESS  2.  Pt will increase RUE P/ROM by 45 degrees to improve ability to use RUE during dressing tasks with minimal compensatory techniques.   Goal status: IN PROGRESS    LONG TERM GOALS: Target date: 11/19/22  Pt will decrease pain in RUE to 5/10 or less to improve ability to sleep for 2+ consecutive hours without waking due to pain.   Goal status: IN PROGRESS  2.  Pt will decrease RUE fascial restrictions to min amounts or less to improve mobility required for functional reaching tasks.   Goal status: IN PROGRESS  3.  Pt will increase RUE A/ROM by 45 degrees to improve ability to use RUE when reaching overhead or behind back during dressing and bathing tasks.   Goal status: IN PROGRESS  4.  Pt will increase RUE strength to 4/5 or greater to improve ability to use RUE when lifting or carrying items during meal preparation/housework/yardwork tasks.   Goal status: IN PROGRESS    ASSESSMENT:  CLINICAL IMPRESSION: Pt reports she has been completing her HEP. Initiated manual techniques and passive stretching, pt with full ROM during stretching. Pt able to complete A/ROM in supine and sitting, also demonstrating ROM WFL, increased time required for completion. Updated HEP for A/ROM, verbal cuing for form and technique during session.   PERFORMANCE DEFICITS: in functional skills including in functional skills including ADLs, IADLs, coordination, tone, ROM, strength, pain,  fascial restrictions, muscle spasms, and UE functional use    PLAN:  OT FREQUENCY: 1-2x/week  OT DURATION: 6 weeks  PLANNED INTERVENTIONS: self care/ADL training, therapeutic exercise, therapeutic activity, neuromuscular re-education, manual therapy, passive range of motion, splinting, electrical stimulation, ultrasound, moist heat, cryotherapy, patient/family education, and DME and/or AE instructions  CONSULTED AND AGREED WITH PLAN OF CARE: Patient  PLAN FOR NEXT SESSION: Follow up on HEP, initiate manual techniques, AA/ROM   Ezra Sites, OTR/L  [phone] 10/22/2022, 11:12 AM

## 2022-10-22 NOTE — Patient Instructions (Signed)

## 2022-10-23 ENCOUNTER — Other Ambulatory Visit (HOSPITAL_COMMUNITY): Payer: Medicaid Other

## 2022-10-25 ENCOUNTER — Ambulatory Visit (HOSPITAL_COMMUNITY)
Admission: RE | Admit: 2022-10-25 | Discharge: 2022-10-25 | Disposition: A | Discharge status: Home / Self Care | Payer: Medicaid Other | Source: Ambulatory Visit | Attending: Orthopaedic Surgery | Admitting: Orthopaedic Surgery

## 2022-10-25 DIAGNOSIS — M5442 Lumbago with sciatica, left side: Secondary | ICD-10-CM | POA: Diagnosis present

## 2022-10-25 DIAGNOSIS — G8929 Other chronic pain: Secondary | ICD-10-CM | POA: Diagnosis present

## 2022-10-26 ENCOUNTER — Ambulatory Visit: Payer: Medicaid Other | Admitting: Orthopaedic Surgery

## 2022-10-28 ENCOUNTER — Other Ambulatory Visit: Payer: Self-pay

## 2022-10-28 ENCOUNTER — Emergency Department (HOSPITAL_COMMUNITY)
Admission: EM | Admit: 2022-10-28 | Discharge: 2022-10-28 | Disposition: A | Discharge status: Home / Self Care | Payer: Medicaid Other | Attending: Emergency Medicine | Admitting: Emergency Medicine

## 2022-10-28 ENCOUNTER — Emergency Department (HOSPITAL_COMMUNITY): Payer: Medicaid Other

## 2022-10-28 DIAGNOSIS — I10 Essential (primary) hypertension: Secondary | ICD-10-CM | POA: Diagnosis not present

## 2022-10-28 DIAGNOSIS — E119 Type 2 diabetes mellitus without complications: Secondary | ICD-10-CM | POA: Diagnosis not present

## 2022-10-28 DIAGNOSIS — R109 Unspecified abdominal pain: Secondary | ICD-10-CM | POA: Diagnosis present

## 2022-10-28 LAB — URINALYSIS, ROUTINE W REFLEX MICROSCOPIC
Bacteria, UA: NONE SEEN
Bilirubin Urine: NEGATIVE
Glucose, UA: 50 mg/dL — AB
Hgb urine dipstick: NEGATIVE
Ketones, ur: NEGATIVE mg/dL
Leukocytes,Ua: NEGATIVE
Nitrite: NEGATIVE
Protein, ur: 100 mg/dL — AB
Specific Gravity, Urine: 1.019 (ref 1.005–1.030)
pH: 6 (ref 5.0–8.0)

## 2022-10-28 LAB — BASIC METABOLIC PANEL
Anion gap: 13 (ref 5–15)
BUN: 17 mg/dL (ref 6–20)
CO2: 26 mmol/L (ref 22–32)
Calcium: 9.4 mg/dL (ref 8.9–10.3)
Chloride: 100 mmol/L (ref 98–111)
Creatinine, Ser: 1.08 mg/dL — ABNORMAL HIGH (ref 0.44–1.00)
GFR, Estimated: >60 mL/min (ref >60)
Glucose, Bld: 138 mg/dL — ABNORMAL HIGH (ref 70–99)
Potassium: 3.5 mmol/L (ref 3.5–5.1)
Sodium: 139 mmol/L (ref 135–145)

## 2022-10-28 LAB — CBC
HCT: 39.2 % (ref 36.0–46.0)
Hemoglobin: 12.2 g/dL (ref 12.0–15.0)
MCH: 24 pg — ABNORMAL LOW (ref 26.0–34.0)
MCHC: 31.1 g/dL (ref 30.0–36.0)
MCV: 77.2 fL — ABNORMAL LOW (ref 80.0–100.0)
Platelets: 212 10*3/uL (ref 150–400)
RBC: 5.08 MIL/uL (ref 3.87–5.11)
RDW: 15.3 % (ref 11.5–15.5)
WBC: 8.5 10*3/uL (ref 4.0–10.5)
nRBC: 0 % (ref 0.0–0.2)

## 2022-10-28 MED ORDER — SODIUM CHLORIDE 0.9 % IV BOLUS
1000.0000 mL | Freq: Once | INTRAVENOUS | Status: AC
  Administered 2022-10-28: 1000 mL via INTRAVENOUS

## 2022-10-28 MED ORDER — LORAZEPAM 2 MG/ML IJ SOLN
0.5000 mg | Freq: Once | INTRAMUSCULAR | Status: AC
  Administered 2022-10-28: 0.5 mg via INTRAVENOUS
  Filled 2022-10-28: qty 1

## 2022-10-28 MED ORDER — KETOROLAC TROMETHAMINE 10 MG PO TABS: 10.0000 mg | ORAL_TABLET | Freq: Four times a day (QID) | ORAL | 0 refills | Status: DC | PRN

## 2022-10-28 MED ORDER — ONDANSETRON HCL 4 MG/2ML IJ SOLN: 4.0000 mg | Freq: Once | INTRAMUSCULAR | Status: DC

## 2022-10-28 MED ORDER — CEPHALEXIN 500 MG PO CAPS: 500.0000 mg | ORAL_CAPSULE | Freq: Four times a day (QID) | ORAL | 0 refills | Status: AC

## 2022-10-28 MED ORDER — HYDROMORPHONE HCL 1 MG/ML IJ SOLN
1.0000 mg | Freq: Once | INTRAMUSCULAR | Status: AC
  Administered 2022-10-28: 1 mg via INTRAVENOUS
  Filled 2022-10-28: qty 1

## 2022-10-28 NOTE — Discharge Instructions (Signed)
You were evaluated in the Emergency Department and after careful evaluation, we did not find any emergent condition requiring admission or further testing in the hospital.  Your exam/testing today is overall reassuring.  Symptoms may be due to a kidney infection.  Recommend increasing the Keflex medication to 4 times daily.  Providing a new prescription for that.  Take for the next 10 days.  Take the ketorolac medication for pain.  Please return to the Emergency Department if you experience any worsening of your condition.   Thank you for allowing [place] to be a part of your care.

## 2022-10-28 NOTE — ED Provider Notes (Signed)
[facility] Emergency Department Provider Note MRN:  [id number]  Arrival date & time: 10/28/22     Chief Complaint   Flank Pain   History of Present Illness   Chelsey Santiago is a 47 y.o. year-old female with a history of diabetes presenting to the ED with chief complaint of flank pain.  Left flank pain for the past few days.  Recent diagnosis of UTI.  Currently on Keflex twice a day.  No fever.  Pain getting worse and worse.  Review of Systems  A thorough review of systems was obtained and all systems are negative except as noted in the HPI and PMH.   Patient's Health History    Past Medical History:  Diagnosis Date   Anemia    H&H of 10.6/33 and 07/2008 and 11.9/35 and 09/2010   Anxiety    Chronic combined systolic and diastolic CHF (congestive heart failure) (HCC)    a. EF 40-45% by echo in 12/2015 with cath showing normal cors b. EF at 45% by repeat echo in 10/2018 c. EF at 30-35% in 03/2020   Depression with anxiety    Diabetes mellitus without complication (HCC)    Diverticulitis    09/2020   Hypertension    Hypertensive heart disease 2009   Pulmonary edema postpartum; mild to moderate mitral regurgitation when hospitalized for CHF in 2009; Echocardiogram in 12/2009-no MR and normal EF; normal CXR in 09/2010   Migraine headache    Miscarriage 03/19/2013   Obesity 04/16/2009   Osteoarthritis, knee 03/29/2011   Preeclampsia    Pulmonary edema    Sleep apnea    Threatened abortion in early pregnancy 03/15/2013    Past Surgical History:  Procedure Laterality Date   BREAST REDUCTION SURGERY  2002   CARDIAC CATHETERIZATION N/A 12/22/2015   Procedure: Left Heart Cath and Coronary Angiography;  Surgeon: Peter M Swaziland, MD;  Location: [facility] INVASIVE CV LAB;  Service: Cardiovascular;  Laterality: N/A;   CESAREAN SECTION N/A 04/09/2014   Procedure: CESAREAN SECTION;  Surgeon: Catalina Antigua, MD;  Location: [facility];  Service: Obstetrics;  Laterality: N/A;    CHOLECYSTECTOMY     HYSTERECTOMY ABDOMINAL WITH SALPINGECTOMY N/A 09/26/2019   Procedure: HYSTERECTOMY ABDOMINAL WITH SALPINGECTOMY;  Surgeon: Lazaro Arms, MD;  Location: [facility];  Service: Gynecology;  Laterality: N/A;   LIPOMA EXCISION Right 09/26/2019   Procedure: EXCISION LIPOMA RIGHT VULVAR;  Surgeon: Lazaro Arms, MD;  Location: [facility];  Service: Gynecology;  Laterality: Right;    Family History  Problem Relation Age of Onset   Diabetes Mother 73   Heart disease Mother    Hyperlipidemia Paternal Grandfather    Hypertension Paternal Grandfather    Heart disease Father    Hypertension Father    Heart disease Maternal Grandmother 46   ADD / ADHD Son    Hypertension Maternal Uncle    Heart attack Brother    Sudden death Neg Hx    Colon cancer Neg Hx    Celiac disease Neg Hx    Inflammatory bowel disease Neg Hx     Social History   Socioeconomic History   Marital status: Married    Spouse name: Not on file   Number of children: 6   Years of education: Not on file   Highest education level: Not on file  Occupational History   Occupation: unemployed    Employer: UNEMPLOYED  Tobacco Use   Smoking status: Never   Smokeless tobacco: Never  Advertising account planner  Vaping Use: Never used  Substance and Sexual Activity   Alcohol use: Yes    Comment: occ   Drug use: No   Sexual activity: Yes    Birth control/protection: Surgical    Comment: hyst  Other Topics Concern   Not on file  Social History Narrative   Lives in [place]   Engaged/boyfriend (father to youngest chid)   4 children: daughter (11 as of 2013), sons (30, 59, 4 as of 2013)   Religion: christian   Social Determinants of Corporate investment banker Strain: Not on file  Food Insecurity: Not on file  Transportation Needs: Not on file  Physical Activity: Not on file  Stress: Not on file  Social Connections: Not on file  Intimate Partner Violence: Not on file     Physical Exam   Vitals:   10/28/22 0323 10/28/22  0500  BP:  (!) 178/111  Pulse:  89  Resp:  18  Temp: 97.6 F (36.4 C)   SpO2:  98%    CONSTITUTIONAL: Well-appearing, NAD NEURO/PSYCH:  Alert and oriented x 3, no focal deficits EYES:  eyes equal and reactive ENT/NECK:  no LAD, no JVD CARDIO: Regular rate, well-perfused, normal S1 and S2 PULM:  CTAB no wheezing or rhonchi GI/GU:  non-distended, non-tender MSK/SPINE:  No gross deformities, no edema SKIN:  no rash, atraumatic   *Additional and/or pertinent findings included in MDM below  Diagnostic and Interventional Summary    EKG Interpretation  Date/Time:    Ventricular Rate:    PR Interval:    QRS Duration:   QT Interval:    QTC Calculation:   R Axis:     Text Interpretation:         Labs Reviewed  URINALYSIS, ROUTINE W REFLEX MICROSCOPIC - Abnormal; Notable for the following components:      Result Value   APPearance HAZY (*)    Glucose, UA 50 (*)    Protein, ur 100 (*)    All other components within normal limits  BASIC METABOLIC PANEL - Abnormal; Notable for the following components:   Glucose, Bld 138 (*)    Creatinine, Ser 1.08 (*)    All other components within normal limits  CBC - Abnormal; Notable for the following components:   MCV 77.2 (*)    MCH 24.0 (*)    All other components within normal limits    CT RENAL STONE STUDY  Final Result      Medications  LORazepam (ATIVAN) injection 0.5 mg (has no administration in time range)  sodium chloride 0.9 % bolus 1,000 mL (0 mLs Intravenous Stopped 10/28/22 0511)  HYDROmorphone (DILAUDID) injection 1 mg (1 mg Intravenous Given 10/28/22 0353)     Procedures  /  Critical Care Procedures  ED Course and Medical Decision Making  Initial Impression and Ddx Question UTI versus pyelonephritis versus kidney stone.  Diverticulitis, splenic infarct felt to be less likely.  Past medical/surgical history that increases complexity of ED encounter: Diabetes  Interpretation of Diagnostics I personally  reviewed the laboratory assessment and my interpretation is as follows: No significant blood count or electrolyte disturbance  CT overall reassuring, no kidney stone, no other acute findings.  Patient Reassessment and Ultimate Disposition/Management     Clinically there is suspicion for pyelonephritis.  Urine is not obviously infected but this could be skewed by the fact that she is currently on antibiotics.  She is not on an adequate dose of Keflex to treat for pyelonephritis.  Will advise escalating treatment to 4 times daily.  Patient feeling a lot better, appropriate for discharge.  Patient management required discussion with the following services or consulting groups:  None  Complexity of Problems Addressed Acute illness or injury that poses threat of life of bodily function  Additional Data Reviewed and Analyzed Further history obtained from: Prior labs/imaging results  Additional Factors Impacting ED Encounter Risk Prescriptions  Elmer Sow. Pilar Plate, MD [facility] Emergency Medicine [facility] [email or web address]  Final Clinical Impressions(s) / ED Diagnoses     ICD-10-CM   1. Flank pain  R10.9       ED Discharge Orders          Ordered    cephALEXin (KEFLEX) 500 MG capsule  4 times daily        10/28/22 0507    ketorolac (TORADOL) 10 MG tablet  Every 6 hours PRN        10/28/22 0507             Discharge Instructions Discussed with and Provided to Patient:    Discharge Instructions      You were evaluated in the Emergency Department and after careful evaluation, we did not find any emergent condition requiring admission or further testing in the hospital.  Your exam/testing today is overall reassuring.  Symptoms may be due to a kidney infection.  Recommend increasing the Keflex medication to 4 times daily.  Providing a new prescription for that.  Take for the next 10 days.  Take the ketorolac medication for pain.  Please return to the  Emergency Department if you experience any worsening of your condition.   Thank you for allowing [place] to be a part of your care.      Sabas Sous, MD 10/28/22 [phone]

## 2022-10-28 NOTE — ED Triage Notes (Signed)
Pt BIB [facility] from home c/o left flank pain, pt states she called her PCP 3 days ago because she thought she had a UTI and was given antibiotics. Reports pain has gotten significantly worse.

## 2022-11-02 ENCOUNTER — Encounter: Payer: Self-pay | Admitting: Orthopaedic Surgery

## 2022-11-02 ENCOUNTER — Ambulatory Visit: Payer: Medicaid Other | Admitting: Orthopaedic Surgery

## 2022-11-02 ENCOUNTER — Telehealth: Payer: Self-pay | Admitting: Cardiology

## 2022-11-02 VITALS — BP 209/142 | HR 94 | Ht 66.0 in | Wt 226.4 lb

## 2022-11-02 DIAGNOSIS — M5442 Lumbago with sciatica, left side: Secondary | ICD-10-CM

## 2022-11-02 DIAGNOSIS — G8929 Other chronic pain: Secondary | ICD-10-CM

## 2022-11-02 DIAGNOSIS — M5136 Other intervertebral disc degeneration, lumbar region: Secondary | ICD-10-CM | POA: Diagnosis not present

## 2022-11-02 MED ORDER — CARVEDILOL 25 MG PO TABS: 25.0000 mg | ORAL_TABLET | Freq: Two times a day (BID) | ORAL | 0 refills | Status: DC

## 2022-11-02 MED ORDER — HYDROCODONE-ACETAMINOPHEN 5-325 MG PO TABS: ORAL_TABLET | ORAL | 0 refills | Status: DC

## 2022-11-02 NOTE — Telephone Encounter (Signed)
*  STAT* If patient is at the pharmacy, call can be transferred to refill team.   1. Which medications need to be refilled? (please list name of each medication and dose if known) carvedilol (COREG) 25 MG tablet Zofran  2. Which pharmacy/location (including street and city if local pharmacy) is medication to be sent to? [facility] - [place], [place] - [address]  3. Do they need a 30 day or 90 day supply?  90 day supply

## 2022-11-02 NOTE — Progress Notes (Signed)
My back still hurts.  She had MRI of the lumbar spine showing: IMPRESSION: 1. No acute osseous abnormality in the lumbar spine. Lumbar spine degeneration with facet arthropathy more so than disc degeneration. Subtle spondylolisthesis at both L3-L4 and L4-L5.   2. No lumbar spinal or lateral recess stenosis. Disc bulging and facet disease combine for up to moderate bilateral L3, moderate to severe left L4, and moderate left L5 neural foraminal stenosis.  I have explained the findings to her.  I will have her see Dr. Alvester Morin for epidural.  She agrees.  Spine/Pelvis examination:  Inspection:  Overall, sacoiliac joint benign and hips nontender; without crepitus or defects.   Thoracic spine inspection: Alignment normal without kyphosis present   Lumbar spine inspection:  Alignment  with normal lumbar lordosis, without scoliosis apparent.   Thoracic spine palpation:  without tenderness of spinal processes   Lumbar spine palpation: without tenderness of lumbar area; without tightness of lumbar muscles    Range of Motion:   Lumbar flexion, forward flexion is normal without pain or tenderness    Lumbar extension is full without pain or tenderness   Left lateral bend is normal without pain or tenderness   Right lateral bend is normal without pain or tenderness   Straight leg raising is normal  Strength & tone: normal   Stability overall normal stability  Encounter Diagnoses  Name Primary?   Chronic left-sided low back pain with left-sided sciatica Yes   DDD (degenerative disc disease), lumbar    I have reviewed the [place] Controlled Substance Reporting System web site prior to prescribing narcotic medicine for this patient.  Return in six weeks.  I have independently reviewed the MRI.    Call if any problem.  Precautions discussed.  Electronically Signed Darreld Mclean, MD 6/18/202410:28 AM

## 2022-11-02 NOTE — Patient Instructions (Signed)
We are referring you to [facility] from [facility] Office address is [address] [place] The phone number is [phone]  The office will call you with an appointment Dr. Newton  

## 2022-11-02 NOTE — Telephone Encounter (Signed)
  Pt c/o medication issue:  1. Name of Medication: carvedilol (COREG) 25 MG tablet   2. How are you currently taking this medication (dosage and times per day)?   Take 1 tablet (25 mg total) by mouth 2 (two) times daily with a meal.    3. Are you having a reaction (difficulty breathing--STAT)? No   4. What is your medication issue? Pt called back and very upset. She said, why the refill sent only for 15 days supply. She said, she's on a fix income and cannot go back and forth to go to pharmacy to get her medications. She wants to resend prescription for 90 days supply

## 2022-11-02 NOTE — Telephone Encounter (Signed)
Completed.

## 2022-11-02 NOTE — Telephone Encounter (Signed)
Patient had no appointment and did not answer call for appointment. Resent medication to pharmacy.

## 2022-11-05 ENCOUNTER — Encounter (HOSPITAL_COMMUNITY): Payer: Medicaid Other | Admitting: Occupational Therapy

## 2022-11-09 ENCOUNTER — Encounter: Payer: Self-pay | Admitting: Nurse Practitioner

## 2022-11-09 ENCOUNTER — Ambulatory Visit (INDEPENDENT_AMBULATORY_CARE_PROVIDER_SITE_OTHER): Payer: Medicaid Other | Admitting: Nurse Practitioner

## 2022-11-09 VITALS — BP 162/97 | HR 82 | Ht 66.0 in | Wt 224.8 lb

## 2022-11-09 DIAGNOSIS — I5022 Chronic systolic (congestive) heart failure: Secondary | ICD-10-CM | POA: Diagnosis not present

## 2022-11-09 DIAGNOSIS — G4733 Obstructive sleep apnea (adult) (pediatric): Secondary | ICD-10-CM

## 2022-11-09 DIAGNOSIS — Z01810 Encounter for preprocedural cardiovascular examination: Secondary | ICD-10-CM

## 2022-11-09 DIAGNOSIS — I1A0 Resistant hypertension: Secondary | ICD-10-CM | POA: Diagnosis not present

## 2022-11-09 DIAGNOSIS — I350 Nonrheumatic aortic (valve) stenosis: Secondary | ICD-10-CM

## 2022-11-09 MED ORDER — AMLODIPINE BESYLATE 10 MG PO TABS: 10.0000 mg | ORAL_TABLET | Freq: Every day | ORAL | 2 refills | Status: DC

## 2022-11-09 MED ORDER — POTASSIUM CHLORIDE CRYS ER 20 MEQ PO TBCR: 20.0000 meq | EXTENDED_RELEASE_TABLET | Freq: Every day | ORAL | 2 refills | Status: DC

## 2022-11-09 MED ORDER — TORSEMIDE 20 MG PO TABS: 40.0000 mg | ORAL_TABLET | Freq: Two times a day (BID) | ORAL | 2 refills | Status: DC

## 2022-11-09 MED ORDER — HYDRALAZINE HCL 100 MG PO TABS: 100.0000 mg | ORAL_TABLET | Freq: Three times a day (TID) | ORAL | 2 refills | Status: DC

## 2022-11-09 MED ORDER — CARVEDILOL 25 MG PO TABS: 25.0000 mg | ORAL_TABLET | Freq: Two times a day (BID) | ORAL | 2 refills | Status: DC

## 2022-11-09 MED ORDER — SPIRONOLACTONE 25 MG PO TABS: 25.0000 mg | ORAL_TABLET | Freq: Every day | ORAL | 2 refills | Status: DC

## 2022-11-09 MED ORDER — DAPAGLIFLOZIN PROPANEDIOL 10 MG PO TABS: 10.0000 mg | ORAL_TABLET | Freq: Every day | ORAL | 2 refills | Status: DC

## 2022-11-09 MED ORDER — ISOSORBIDE MONONITRATE ER 30 MG PO TB24: 30.0000 mg | ORAL_TABLET | Freq: Two times a day (BID) | ORAL | 6 refills | Status: DC

## 2022-11-09 MED ORDER — ENTRESTO 97-103 MG PO TABS: 1.0000 | ORAL_TABLET | Freq: Two times a day (BID) | ORAL | 2 refills | Status: DC

## 2022-11-09 NOTE — Progress Notes (Signed)
Cardiology Office Note:  .   Date:  11/09/2022  ID:  Freddie Apley, DOB 03/01/1976, MRN [id number] PCP: Golden Pop, FNP  [facility] Providers Cardiologist:  Dina Rich, MD    History of Present Illness: .   Chelsey Santiago is a 47 y.o. female with a PMH of HTN, chronic systolic CHF, OSA, orthostatic hypotension, mild aortic valve stenosis, and occasional leg edema, who presents today for pre-operative cardiovascular clearance.   Normal cath in 2017. cMRI in 2017 no myocardial LGE. Echo 03/2021 showed EF 30-35%, grade 2 DD, felt to be hypertensive CM.   Last seen by Dr. Dina Rich on October 30, 2021. He noted BP was difficult to control d/t untreated OSA. No RAS on renal artery duplex.   Today she presents for pre-operative cardiovascular risk assessment. She states she is doing well from a cardiac perspective. Wants to lose weight to do more activities with her children. Denies any chest pain, shortness of breath, palpitations, syncope, presyncope, dizziness, orthopnea, PND, swelling or significant weight changes, acute bleeding, or claudication. Has upcoming visit regarding her OSA next month.   SH: Has six children   Studies Reviewed: Marland Kitchen       EKG ordered today and reveals: Normal sinus rhythm with incomplete left bundle branch block, 82 bpm, nonspecific T wave abnormality, no acute ischemic changes.  Echo 03/2021: 1. Left ventricular ejection fraction, by estimation, is 30 to 35%. The  left ventricle has moderately decreased function. The left ventricle  demonstrates global hypokinesis. There is moderate left ventricular  hypertrophy. Left ventricular diastolic  parameters are consistent with Grade II diastolic dysfunction  (pseudonormalization). Elevated left atrial pressure.   2. Right ventricular systolic function is normal. The right ventricular  size is mildly enlarged.   3. Left atrial size was mildly dilated.   4. Right atrial size was mildly  dilated.   5. The mitral valve is normal in structure. No evidence of mitral valve  regurgitation. No evidence of mitral stenosis.   6. The aortic valve has an indeterminant number of cusps. Aortic valve  regurgitation is not visualized. Mild aortic valve stenosis.   7. The inferior vena cava is normal in size with greater than 50%  respiratory variability, suggesting right atrial pressure of 3 mmHg.  Risk Assessment/Calculations:    HYPERTENSION CONTROL Vitals:   11/09/22 1000 11/09/22 1109  BP: (!) 160/100 (!) 162/97    The patient's blood pressure is elevated above target today.  In order to address the patient's elevated BP: A current anti-hypertensive medication was adjusted today.; Follow up with general cardiology has been recommended.          Physical Exam:   VS:  BP (!) 162/97 (BP Location: Left Arm, Patient Position: Sitting, Cuff Size: Normal)   Pulse 82   Ht 5\' 6"  (1.676 m)   Wt 224 lb 12.8 oz (102 kg)   LMP 08/21/2019   SpO2 99%   BMI 36.28 kg/m    Wt Readings from Last 3 Encounters:  11/09/22 224 lb 12.8 oz (102 kg)  11/02/22 226 lb 6 oz (102.7 kg)  10/13/22 225 lb (102.1 kg)    GEN: Obese, 47 y.o. female in no acute distress NECK: No JVD; No carotid bruits CARDIAC: S1/S2, RRR, no murmurs, rubs, gallops RESPIRATORY:  Clear to auscultation without rales, wheezing or rhonchi  ABDOMEN: Soft, non-tender, non-distended EXTREMITIES:  No edema; No deformity   ASSESSMENT AND PLAN: .   Pre-operative cardiovascular risk assessment Ms.  Bishara's perioperative risk of a major cardiac event is 11% according to the Revised Cardiac Risk Index (RCRI).  Therefore, she is at high risk for perioperative complications.   Her functional capacity is good at 5.07 METs according to the Duke Activity Status Index (DASI). Recommendations: According to ACC/AHA guidelines, no further cardiovascular testing needed.  The patient may proceed to surgery at acceptable risk.    Antiplatelet and/or Anticoagulation Recommendations: For low risk bleeding procedures, we prefer Aspirin to be continued prior to surgery.  If patient is felt to be at bleeding risk, okay to hold aspirin 7 days prior to procedure and resume when felt safe to do so. Discussed high risk to patient and she verbalized understanding and is agreeable to proceed. Will route note to requesting party.  2. 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  Compliant with her medications.  No RAS seen on renal artery duplex.  Continue amlodipine, carvedilol, hydralazine, Entresto, spironolactone, and will increase Imdur to 30 mg BID. Discussed to monitor BP at home at least 2 hours after medications and sitting for 5-10 minutes. Heart healthy diet and regular cardiovascular exercise encouraged. Losing weight will also help her BP.   3. Chronic systolic CHF Stage C, NYHA class I-II symptoms. 03/2021 Echo revealed EF 30 to 35%. Euvolemic and well compensated on exam.  Continue current medication regimen.  Increasing Imdur to 30 mg twice daily. Low sodium diet, fluid restriction <2L, and daily weights encouraged. Educated to contact our office for weight gain of 2 lbs overnight or 5 lbs in one week.   4. Mild aortic valve stenosis TTE 03/2021 revealed mild aortic valve stenosis, will update Echo at this time, however Echo does not need to be performed prior to upcoming procedure.    5. OSA Says she just completed home sleep study, following Pulmonology. Follow-up with Dr. Craige Cotta next month.   Dispo: Will refill her cardiac medications per her request. Follow-up with me or APP in 6 months or sooner if anything changes.   Signed, Sharlene Dory, NP

## 2022-11-09 NOTE — Patient Instructions (Addendum)
Medication Instructions:  Your physician has recommended you make the following change in your medication:  Start taking Imdur 30 Mg 2 x daily Continue all other medications as prescribed  Labwork: none  Testing/Procedures: Your physician has requested that you have an echocardiogram. Echocardiography is a painless test that uses sound waves to create images of your heart. It provides your doctor with information about the size and shape of your heart and how well your heart's chambers and valves are working. This procedure takes approximately one hour. There are no restrictions for this procedure. Please do NOT wear cologne, perfume, aftershave, or lotions (deodorant is allowed). Please arrive 15 minutes prior to your appointment time.   Follow-Up: Your physician recommends that you schedule a follow-up appointment in: 6 months with Philis Nettle  Any Other Special Instructions Will Be Listed Below (If Applicable).  If you need a refill on your cardiac medications before your next appointment, please call your pharmacy.

## 2022-11-12 ENCOUNTER — Encounter (HOSPITAL_COMMUNITY): Payer: Self-pay | Admitting: Occupational Therapy

## 2022-11-12 ENCOUNTER — Ambulatory Visit (HOSPITAL_COMMUNITY): Payer: Medicaid Other | Admitting: Occupational Therapy

## 2022-11-12 DIAGNOSIS — M25511 Pain in right shoulder: Secondary | ICD-10-CM

## 2022-11-12 DIAGNOSIS — M25611 Stiffness of right shoulder, not elsewhere classified: Secondary | ICD-10-CM

## 2022-11-12 DIAGNOSIS — R29898 Other symptoms and signs involving the musculoskeletal system: Secondary | ICD-10-CM

## 2022-11-12 NOTE — Therapy (Signed)
OUTPATIENT OCCUPATIONAL THERAPY ORTHO TREATMENT  Patient Name: Chelsey Santiago MRN: [id number] DOB:08-May-1976, 47 y.o., female Today's Date: 11/12/2022   END OF SESSION:  OT End of Session - 11/12/22 1114     Visit Number 3    Number of Visits 6    Date for OT Re-Evaluation 11/19/22    Authorization Type [employer]    Authorization Time Period 30 visit limit PT/OT/ST combined    Authorization - Visit Number 3    Authorization - Number of Visits 30    OT Start Time 1033    OT Stop Time 1114    OT Time Calculation (min) 41 min    Activity Tolerance Patient tolerated treatment well    Behavior During Therapy WFL for tasks assessed/performed               Past Medical History:  Diagnosis Date   Anemia    H&H of 10.6/33 and 07/2008 and 11.9/35 and 09/2010   Anxiety    Chronic combined systolic and diastolic CHF (congestive heart failure) (HCC)    a. EF 40-45% by echo in 12/2015 with cath showing normal cors b. EF at 45% by repeat echo in 10/2018 c. EF at 30-35% in 03/2020   Depression with anxiety    Diabetes mellitus without complication (HCC)    Diverticulitis    09/2020   Hypertension    Hypertensive heart disease 2009   Pulmonary edema postpartum; mild to moderate mitral regurgitation when hospitalized for CHF in 2009; Echocardiogram in 12/2009-no MR and normal EF; normal CXR in 09/2010   Migraine headache    Miscarriage 03/19/2013   Obesity 04/16/2009   Osteoarthritis, knee 03/29/2011   Preeclampsia    Pulmonary edema    Sleep apnea    Threatened abortion in early pregnancy 03/15/2013   Past Surgical History:  Procedure Laterality Date   BREAST REDUCTION SURGERY  2002   CARDIAC CATHETERIZATION N/A 12/22/2015   Procedure: Left Heart Cath and Coronary Angiography;  Surgeon: Peter M Swaziland, MD;  Location: [facility] INVASIVE CV LAB;  Service: Cardiovascular;  Laterality: N/A;   CESAREAN SECTION N/A 04/09/2014   Procedure: CESAREAN SECTION;  Surgeon: Catalina Antigua, MD;   Location: [facility] ORS;  Service: Obstetrics;  Laterality: N/A;   CHOLECYSTECTOMY     HYSTERECTOMY ABDOMINAL WITH SALPINGECTOMY N/A 09/26/2019   Procedure: HYSTERECTOMY ABDOMINAL WITH SALPINGECTOMY;  Surgeon: Lazaro Arms, MD;  Location: [facility];  Service: Gynecology;  Laterality: N/A;   LIPOMA EXCISION Right 09/26/2019   Procedure: EXCISION LIPOMA RIGHT VULVAR;  Surgeon: Lazaro Arms, MD;  Location: [facility];  Service: Gynecology;  Laterality: Right;   Patient Active Problem List   Diagnosis Date Noted   Mixed hyperlipidemia 05/03/2022   Influenza A 04/03/2021   Prolonged QT interval 04/03/2021   Hyponatremia 04/03/2021   Elevated brain natriuretic peptide (BNP) level 04/03/2021   History of diverticulitis 01/08/2021   Vomiting 10/18/2020   Acute diverticulitis 10/10/2020   Chronic anemia 10/07/2020   Pneumonia due to COVID-19 virus 06/09/2020   Acute on chronic respiratory failure with hypoxia (HCC) 06/09/2020   Obstructive sleep apnea 03/29/2020   Flash pulmonary edema (HCC) 03/27/2020   Back pain 12/19/2019   Vaginal discharge 12/19/2019   Vaginal itching 12/19/2019   BV (bacterial vaginosis) 12/19/2019   Chronic hypertension 12/19/2019   Fibroids 09/26/2019   S/P hysterectomy 09/26/2019   Acute blood loss anemia 09/26/2019   Pain of upper abdomen 03/14/2019   Dysphagia 03/14/2019   Gastroesophageal reflux  disease 03/14/2019   Obesity, Class II, BMI 35-39.9    Acute exacerbation of CHF (congestive heart failure) (HCC) 10/26/2018   Chronic combined systolic and diastolic CHF (congestive heart failure) (HCC) 06/02/2018   Uncontrolled type 2 diabetes mellitus with hyperglycemia (HCC) 06/02/2018   Hyperglycemia due to diabetes mellitus (HCC) 06/02/2018   Influenza B 05/13/2018   Hypomagnesemia 05/12/2018   Headache 05/12/2018   Upper respiratory tract infection    HCAP (healthcare-associated pneumonia)    Diarrhea 10/17/2017   Bad headache    CHF exacerbation (HCC) 09/29/2017    Iron deficiency anemia 05/16/2017   Vitamin D deficiency 11/25/2016   Iron deficiency 11/25/2016   History of acute myocardial infarction 10/05/2016   CHF (congestive heart failure) (HCC) 05/06/2016   Diabetes mellitus with complication (HCC) 05/06/2016   Leukocytosis 05/06/2016   Neuropathy 05/06/2016   Depression 04/16/2016   Chronic tension-type headache, not intractable 04/16/2016   AKI (acute kidney injury) (HCC)    Hyperkalemia    Nonischemic cardiomyopathy (HCC)    Acute on chronic combined systolic and diastolic ACC/AHA stage C congestive heart failure (HCC) 04/03/2016   Acute on chronic combined systolic and diastolic CHF, NYHA class 4 (HCC) 04/03/2016   Hypertensive emergency 04/03/2016   Cardiomyopathy due to hypertension (HCC) 12/22/2015   Normal coronary arteries 12/22/2015   Troponin level elevated 12/22/2015   NSTEMI (non-ST elevated myocardial infarction) (HCC) 12/20/2015   Dental infection 10/10/2015   Chest pain 09/05/2015   Systolic CHF, chronic (HCC) 09/05/2015   Left lower quadrant abdominal pain    Type 2 diabetes mellitus without complication (HCC) 04/28/2014   Essential hypertension    Resistant hypertension 04/23/2014   Hypertensive urgency 04/22/2014   Acute CHF (HCC) 04/22/2014   S/P cesarean section 04/11/2014   Acute pulmonary edema (HCC) 04/11/2014   Postoperative anemia 04/11/2014   Elevated serum creatinine 04/11/2014   Preeclampsia, severe 04/09/2014   Pre-eclampsia superimposed on chronic hypertension, antepartum 04/08/2014   Dyspnea 04/08/2014   Polyhydramnios in third trimester, antepartum 03/14/2014   Abnormal maternal glucose tolerance, antepartum 03/11/2014   High-risk pregnancy 03/11/2014   Pre-existing essential hypertension complicating pregnancy 03/11/2014   Impaired glucose tolerance during pregnancy, antepartum 11/27/2013   Leiomyoma of uterus 11/22/2013   History of gestational diabetes in prior pregnancy, currently pregnant in  first trimester 11/22/2013   Hx of preeclampsia, prior pregnancy, currently pregnant 11/22/2013   Short interval between pregnancies affecting pregnancy, antepartum 11/22/2013   Supervision of high-risk pregnancy of elderly primigravida (>= 29 years old at delivery), third trimester 11/22/2013   Miscarriage 03/19/2013   Major depressive disorder, single episode, unspecified 09/27/2011   Hypertension    Hypertensive cardiovascular disease    Microcytic anemia    Osteoarthrosis involving lower leg 03/29/2011   Hypokalemia 12/12/2009   OSA on CPAP 12/09/2009   Class 2 severe obesity due to excess calories with serious comorbidity and body mass index (BMI) of 36.0 to 36.9 in adult [facility]) 04/16/2009    PCP: Tresa Res, FNP REFERRING PROVIDER: Dr. Darreld Mclean  ONSET DATE: February 2024  REFERRING DIAG: Chronic right shoulder pain  THERAPY DIAG:  Acute pain of right shoulder  Stiffness of right shoulder, not elsewhere classified  Other symptoms and signs involving the musculoskeletal system  Rationale for Evaluation and Treatment: Rehabilitation  SUBJECTIVE:   SUBJECTIVE STATEMENT: S: "it's been ok, I still want to sleep on my stomach."  PERTINENT HISTORY: Pt is a 47 y/o female s/p fall at a laundry mat in 06/2022  during which she landed on her right arm with elbow braced against the concrete and braced with her LUE. Pt has had pain since this time, had an MRI which showed a partial intrasubstance insertional tear of the supraspinatus tendon anteriorly with probable extension to the bursal surface and mild infraspinatus tendinosis. Pt reports she is supposed to follow up with Dr. Dallas Schimke for a second opinion regarding possible surgical repair.   PRECAUTIONS: None  WEIGHT BEARING RESTRICTIONS: No  PAIN:  Are you having pain? Yes: NPRS scale: 6/10 Pain location: right shoulder Pain description: sore, stabbing with movement Aggravating factors: sleeping on it,  movement Relieving factors: ibuprofen  FALLS: Has patient fallen in last 6 months? Yes. Number of falls 1  PLOF: Independent  PATIENT GOALS: To have less pain in the right arm.   NEXT MD VISIT: None scheduled  OBJECTIVE:   HAND DOMINANCE: Right  ADLs: Overall ADLs: Sleeping is very difficulty as pt is a stomach sleeper. Pt has a lot of difficulty with dressing and bathing, reaching behind her back. Pt has difficulty with functional reaching.   UPPER EXTREMITY ROM:       Assessed in sitting, er/IR adducted  Active ROM Right eval  Shoulder flexion 99  Shoulder abduction 87  Shoulder internal rotation 90  Shoulder external rotation 50  (Blank rows = not tested)     Passive ROM Right eval  Shoulder flexion 105  Shoulder abduction 99  Shoulder internal rotation 90  Shoulder external rotation 72  (Blank rows = not tested)  UPPER EXTREMITY MMT:     Assessed in sitting, er/IR adducted, observation due to pain and limited ROM  MMT Right eval  Shoulder flexion 3/5  Shoulder abduction 3/5  Shoulder internal rotation 3/5  Shoulder external rotation 3/5  (Blank rows = not tested)  SENSATION: Has tingling in the hands-unsure of due to neuropathy or due to the fall   OBSERVATIONS: Mod fascial restrictions along right upper arm, anterior shoulder, and trapezius regions   TODAY'S TREATMENT:                                                                                                                              DATE:  11/12/22 -Myofascial release to right upper arm, anterior shoulder, trapezius, and scapular regions to decrease pain and fascial restrictions and increase joint ROM in RUE -P/ROM: supine-flexion, abduction, er/IR, horizontal abduction, 5 reps -A/ROM: supine-protraction, flexion, er/IR, horizontal abduction, abduction, 10 reps -Proximal shoulder strengthening: supine-paddles, criss cross, circles each direction, 10 reps -Scapular theraband: red-row,  extension, retraction, 10 reps -Overhead lacing: seated-lacing from top down then reversing -UBE: level 1, 2' forward, 2' reverse, pace 4.0  10/22/22 -Myofascial release to right upper arm, anterior shoulder, trapezius, and scapular regions to decrease pain and fascial restrictions and increase joint ROM in RUE -P/ROM: supine-flexion, abduction, er/IR, horizontal abduction, 5 reps -A/ROM: supine-protraction, flexion, er/IR, horizontal abduction, abduction, 10 reps -A/ROM: seated-protraction, flexion, er/IR, horizontal  abduction, abduction, 5 reps    PATIENT EDUCATION: Education details: scapular theraband Person educated: Patient Education method: Explanation, Demonstration, and Handouts Education comprehension: verbalized understanding  HOME EXERCISE PROGRAM: Eval: table slides, scapular A/ROM 6/7: A/ROM 6/28: scapular theraband-red  GOALS: Goals reviewed with patient? Yes   SHORT TERM GOALS: Target date: 10/29/22  Pt will be provided with and educated on HEP to improve mobility in RUE required for use during ADL completion.   Goal status: IN PROGRESS  2.  Pt will increase RUE P/ROM by 45 degrees to improve ability to use RUE during dressing tasks with minimal compensatory techniques.   Goal status: IN PROGRESS    LONG TERM GOALS: Target date: 11/19/22  Pt will decrease pain in RUE to 5/10 or less to improve ability to sleep for 2+ consecutive hours without waking due to pain.   Goal status: IN PROGRESS  2.  Pt will decrease RUE fascial restrictions to min amounts or less to improve mobility required for functional reaching tasks.   Goal status: IN PROGRESS  3.  Pt will increase RUE A/ROM by 45 degrees to improve ability to use RUE when reaching overhead or behind back during dressing and bathing tasks.   Goal status: IN PROGRESS  4.  Pt will increase RUE strength to 4/5 or greater to improve ability to use RUE when lifting or carrying items during meal  preparation/housework/yardwork tasks.   Goal status: IN PROGRESS    ASSESSMENT:  CLINICAL IMPRESSION: Pt reports she has been completing her HEP and it's going ok, has been having back pain too. Continued with manual techniques and passive stretching, pt with full ROM. Continued with A/ROM and added proximal shoulder strengthening, scapular theraband. Pt completing overhead lacing working on activity tolerance. Verbal cuing for form and technique during session exercises. Updated HEP for scapular theraband.   PERFORMANCE DEFICITS: in functional skills including in functional skills including ADLs, IADLs, coordination, tone, ROM, strength, pain, fascial restrictions, muscle spasms, and UE functional use    PLAN:  OT FREQUENCY: 1-2x/week  OT DURATION: 6 weeks  PLANNED INTERVENTIONS: self care/ADL training, therapeutic exercise, therapeutic activity, neuromuscular re-education, manual therapy, passive range of motion, splinting, electrical stimulation, ultrasound, moist heat, cryotherapy, patient/family education, and DME and/or AE instructions  CONSULTED AND AGREED WITH PLAN OF CARE: Patient  PLAN FOR NEXT SESSION: Reassessment, follow up on HEP   Ezra Sites, OTR/L  [phone] 11/12/2022, 11:15 AM

## 2022-11-12 NOTE — Patient Instructions (Signed)

## 2022-11-16 ENCOUNTER — Encounter (HOSPITAL_COMMUNITY): Payer: Self-pay

## 2022-11-16 ENCOUNTER — Other Ambulatory Visit: Payer: Self-pay

## 2022-11-16 ENCOUNTER — Emergency Department (HOSPITAL_COMMUNITY)
Admission: EM | Admit: 2022-11-16 | Discharge: 2022-11-16 | Disposition: A | Discharge status: Home / Self Care | Payer: Medicaid Other | Attending: Emergency Medicine | Admitting: Emergency Medicine

## 2022-11-16 ENCOUNTER — Ambulatory Visit: Payer: Medicaid Other | Attending: Nurse Practitioner

## 2022-11-16 ENCOUNTER — Emergency Department (HOSPITAL_COMMUNITY): Payer: Medicaid Other

## 2022-11-16 DIAGNOSIS — Z79899 Other long term (current) drug therapy: Secondary | ICD-10-CM | POA: Insufficient documentation

## 2022-11-16 DIAGNOSIS — I10 Essential (primary) hypertension: Secondary | ICD-10-CM | POA: Diagnosis not present

## 2022-11-16 DIAGNOSIS — E1140 Type 2 diabetes mellitus with diabetic neuropathy, unspecified: Secondary | ICD-10-CM | POA: Insufficient documentation

## 2022-11-16 DIAGNOSIS — J168 Pneumonia due to other specified infectious organisms: Secondary | ICD-10-CM | POA: Insufficient documentation

## 2022-11-16 DIAGNOSIS — Z7982 Long term (current) use of aspirin: Secondary | ICD-10-CM | POA: Diagnosis not present

## 2022-11-16 DIAGNOSIS — Z20822 Contact with and (suspected) exposure to covid-19: Secondary | ICD-10-CM | POA: Diagnosis not present

## 2022-11-16 DIAGNOSIS — R051 Acute cough: Secondary | ICD-10-CM

## 2022-11-16 DIAGNOSIS — M5410 Radiculopathy, site unspecified: Secondary | ICD-10-CM | POA: Insufficient documentation

## 2022-11-16 DIAGNOSIS — Z7984 Long term (current) use of oral hypoglycemic drugs: Secondary | ICD-10-CM | POA: Diagnosis not present

## 2022-11-16 DIAGNOSIS — J189 Pneumonia, unspecified organism: Secondary | ICD-10-CM

## 2022-11-16 DIAGNOSIS — Z794 Long term (current) use of insulin: Secondary | ICD-10-CM | POA: Diagnosis not present

## 2022-11-16 DIAGNOSIS — R0602 Shortness of breath: Secondary | ICD-10-CM | POA: Diagnosis present

## 2022-11-16 LAB — CBC WITH DIFFERENTIAL/PLATELET
Abs Immature Granulocytes: 0.02 10*3/uL (ref 0.00–0.07)
Basophils Absolute: 0.1 10*3/uL (ref 0.0–0.1)
Basophils Relative: 1 %
Eosinophils Absolute: 0.1 10*3/uL (ref 0.0–0.5)
Eosinophils Relative: 1 %
HCT: 33.2 % — ABNORMAL LOW (ref 36.0–46.0)
Hemoglobin: 10.5 g/dL — ABNORMAL LOW (ref 12.0–15.0)
Immature Granulocytes: 0 %
Lymphocytes Relative: 16 %
Lymphs Abs: 1.3 10*3/uL (ref 0.7–4.0)
MCH: 24.2 pg — ABNORMAL LOW (ref 26.0–34.0)
MCHC: 31.6 g/dL (ref 30.0–36.0)
MCV: 76.7 fL — ABNORMAL LOW (ref 80.0–100.0)
Monocytes Absolute: 0.6 10*3/uL (ref 0.1–1.0)
Monocytes Relative: 7 %
Neutro Abs: 6.4 10*3/uL (ref 1.7–7.7)
Neutrophils Relative %: 75 %
Platelets: 189 10*3/uL (ref 150–400)
RBC: 4.33 MIL/uL (ref 3.87–5.11)
RDW: 14.8 % (ref 11.5–15.5)
WBC: 8.4 10*3/uL (ref 4.0–10.5)
nRBC: 0 % (ref 0.0–0.2)

## 2022-11-16 LAB — BASIC METABOLIC PANEL
Anion gap: 10 (ref 5–15)
BUN: 23 mg/dL — ABNORMAL HIGH (ref 6–20)
CO2: 24 mmol/L (ref 22–32)
Calcium: 8.1 mg/dL — ABNORMAL LOW (ref 8.9–10.3)
Chloride: 102 mmol/L (ref 98–111)
Creatinine, Ser: 1.13 mg/dL — ABNORMAL HIGH (ref 0.44–1.00)
GFR, Estimated: >60 mL/min (ref >60)
Glucose, Bld: 131 mg/dL — ABNORMAL HIGH (ref 70–99)
Potassium: 3.1 mmol/L — ABNORMAL LOW (ref 3.5–5.1)
Sodium: 136 mmol/L (ref 135–145)

## 2022-11-16 LAB — RESP PANEL BY RT-PCR (RSV, FLU A&B, COVID)  RVPGX2
Influenza A by PCR: NEGATIVE
Influenza B by PCR: NEGATIVE
Resp Syncytial Virus by PCR: NEGATIVE
SARS Coronavirus 2 by RT PCR: NEGATIVE

## 2022-11-16 LAB — BRAIN NATRIURETIC PEPTIDE: B Natriuretic Peptide: 2673 pg/mL — ABNORMAL HIGH (ref 0.0–100.0)

## 2022-11-16 MED ORDER — AMOXICILLIN-POT CLAVULANATE 875-125 MG PO TABS: 1.0000 | ORAL_TABLET | Freq: Two times a day (BID) | ORAL | 0 refills | Status: DC

## 2022-11-16 MED ORDER — SODIUM CHLORIDE 0.9 % IV SOLN
1.0000 g | Freq: Once | INTRAVENOUS | Status: AC
  Administered 2022-11-16: 1 g via INTRAVENOUS
  Filled 2022-11-16: qty 10

## 2022-11-16 MED ORDER — ONDANSETRON HCL 4 MG/2ML IJ SOLN
4.0000 mg | Freq: Once | INTRAMUSCULAR | Status: AC
  Administered 2022-11-16: 4 mg via INTRAVENOUS
  Filled 2022-11-16: qty 2

## 2022-11-16 MED ORDER — CYCLOBENZAPRINE HCL 10 MG PO TABS: 10.0000 mg | ORAL_TABLET | Freq: Two times a day (BID) | ORAL | 0 refills | Status: DC | PRN

## 2022-11-16 MED ORDER — SPIRONOLACTONE 25 MG PO TABS
25.0000 mg | ORAL_TABLET | ORAL | Status: AC
  Administered 2022-11-16: 25 mg via ORAL
  Filled 2022-11-16: qty 1

## 2022-11-16 MED ORDER — CARVEDILOL 12.5 MG PO TABS
25.0000 mg | ORAL_TABLET | ORAL | Status: AC
  Administered 2022-11-16: 25 mg via ORAL
  Filled 2022-11-16: qty 2

## 2022-11-16 MED ORDER — AMLODIPINE BESYLATE 5 MG PO TABS
10.0000 mg | ORAL_TABLET | Freq: Once | ORAL | Status: AC
  Administered 2022-11-16: 10 mg via ORAL
  Filled 2022-11-16: qty 2

## 2022-11-16 MED ORDER — HYDRALAZINE HCL 25 MG PO TABS
100.0000 mg | ORAL_TABLET | Freq: Once | ORAL | Status: AC
  Administered 2022-11-16: 100 mg via ORAL
  Filled 2022-11-16: qty 4

## 2022-11-16 MED ORDER — ISOSORBIDE MONONITRATE ER 30 MG PO TB24
30.0000 mg | ORAL_TABLET | Freq: Every day | ORAL | Status: DC
  Administered 2022-11-16: 30 mg via ORAL
  Filled 2022-11-16: qty 1

## 2022-11-16 MED ORDER — TORSEMIDE 20 MG PO TABS
20.0000 mg | ORAL_TABLET | Freq: Every day | ORAL | Status: DC
  Administered 2022-11-16: 20 mg via ORAL
  Filled 2022-11-16: qty 1

## 2022-11-16 MED ORDER — AZITHROMYCIN 250 MG PO TABS: 250.0000 mg | ORAL_TABLET | Freq: Every day | ORAL | 0 refills | Status: DC

## 2022-11-16 MED ORDER — HYDROMORPHONE HCL 1 MG/ML IJ SOLN
0.5000 mg | Freq: Once | INTRAMUSCULAR | Status: AC
  Administered 2022-11-16: 0.5 mg via INTRAVENOUS
  Filled 2022-11-16: qty 0.5

## 2022-11-16 MED ORDER — AZITHROMYCIN 250 MG PO TABS
500.0000 mg | ORAL_TABLET | ORAL | Status: AC
  Administered 2022-11-16: 500 mg via ORAL
  Filled 2022-11-16: qty 2

## 2022-11-16 NOTE — ED Notes (Signed)
Pt transported back to room from Xray via stretcher.  

## 2022-11-16 NOTE — ED Triage Notes (Addendum)
Pt BIB [facility] from home c/o cough, L back pain, difficulty sleeping X 2 weeks. Hypertensive in triage, pt reports she could not take HTN meds yesterday or today because "couldn't get out of bed." Ambulatory from EMS stretcher to ED stretcher.

## 2022-11-16 NOTE — Discharge Instructions (Signed)
You were seen for your pneumonia in the emergency department.   At home, please take the antibiotics we have prescribed you.  Take Tylenol, lidocaine patches, and the cyclobenzaprine for your back pain and pain in your side.  Check your MyChart online for the results of any tests that had not resulted by the time you left the emergency department.   Follow-up with your primary doctor in 2-3 days regarding your visit.  Please talk to them about your Lyrica prescription to see if it can be increased for your back pain.  Return immediately to the emergency department if you experience any of the following: Worsening pain, shortness of breath, or any other concerning symptoms.    Thank you for visiting our Emergency Department. It was a pleasure taking care of you today.

## 2022-11-16 NOTE — ED Provider Notes (Signed)
[place] EMERGENCY DEPARTMENT AT [facility] Provider Note   CSN: [id number] Arrival date & time: 11/16/22  4098     History  Chief Complaint  Patient presents with   Cough   Back Pain    Chelsey Santiago is a 47 y.o. female.  47 year old female with a history of heart failure with reduced ejection fraction, hypertension, diabetic neuropathy who presents to the emergency department with side pain and cough.  Patient states that on February 2 she had a fall and since then has been experiencing pain in her left side.  Describes it as radiating from her left back to her left upper quadrant and occasionally down to her left knee.  Does have associated numbness on the outside of her left leg.  No bowel or bladder incontinence.  No changes in sensation when she wipes.  No additional back trauma.  Has already had a CT of the abdomen pelvis without contrast to evaluate her for a kidney stone that was without acute abnormality.  Also had an MRI of the lumbar spine without signs of spinal cord compression.  Says that movement, breathing, and eating do not exacerbate her pain.  Says that with it persisting she wanted to come into the emergency department for evaluation.  No IV drug use or indwelling catheters.  No history of cancer.  Also has had a cough for the past 2 days.  Says it is not productive.  Has had some mild bilateral lower extremity swelling.  No sick contacts.  Did have to use a pillow to prop her up to sleep last night.  Did not take her antihypertensives this morning.  No shortness of breath.  No fevers.       Home Medications Prior to Admission medications   Medication Sig Start Date End Date Taking? Authorizing Provider  amoxicillin-clavulanate (AUGMENTIN) 875-125 MG tablet Take 1 tablet by mouth every 12 (twelve) hours. 11/16/22  Yes Rondel Baton, MD  azithromycin (ZITHROMAX) 250 MG tablet Take 1 tablet (250 mg total) by mouth daily. Take first 2 tablets together,  then 1 every day until finished. 11/16/22  Yes Rondel Baton, MD  cyclobenzaprine (FLEXERIL) 10 MG tablet Take 1 tablet (10 mg total) by mouth 2 (two) times daily as needed for muscle spasms. 11/16/22  Yes Rondel Baton, MD  ACCU-CHEK GUIDE test strip USE AS DIRECTED TO TESTCBLOOD SUGAR TWICE DAILY. 06/11/20   [provider]  albuterol (VENTOLIN HFA) 108 (90 Base) MCG/ACT inhaler Inhale 1 puff into the lungs every 6 (six) hours as needed for wheezing or shortness of breath. 03/30/20   Erick Blinks, MD  amLODipine (NORVASC) 10 MG tablet Take 1 tablet (10 mg total) by mouth daily. 11/09/22   Sharlene Dory, NP  aspirin EC 81 MG tablet Take 81 mg by mouth daily.    [provider]  budesonide-formoterol (SYMBICORT) 80-4.5 MCG/ACT inhaler Inhale 2 puffs into the lungs 2 (two) times daily. 03/30/20   Erick Blinks, MD  carvedilol (COREG) 25 MG tablet Take 1 tablet (25 mg total) by mouth 2 (two) times daily with a meal. 11/09/22   Sharlene Dory, NP  clonazePAM (KLONOPIN) 0.5 MG tablet Take 0.5 mg by mouth 2 (two) times daily. 12/19/18   [provider]  dapagliflozin propanediol (FARXIGA) 10 MG TABS tablet Take 1 tablet (10 mg total) by mouth daily before breakfast. 11/09/22   Sharlene Dory, NP  gabapentin (NEURONTIN) 800 MG tablet Take 800 mg by mouth 3 (three)  times daily. 09/01/20   [provider]  hydrALAZINE (APRESOLINE) 100 MG tablet Take 1 tablet (100 mg total) by mouth 3 (three) times daily. 11/09/22   Sharlene Dory, NP  HYDROcodone-acetaminophen (NORCO/VICODIN) 5-325 MG tablet One tablet every six hours for pain.  Limit 7 days. 11/02/22   Darreld Mclean, MD  ibuprofen (ADVIL) 600 MG tablet Take 1 tablet (600 mg total) by mouth every 6 (six) hours as needed. 07/20/22   Darreld Mclean, MD  insulin glargine (LANTUS SOLOSTAR) 100 UNIT/ML Solostar Pen Inject 40 Units into the skin at bedtime. 05/03/22   Roma Kayser, MD  insulin lispro (HUMALOG) 100  UNIT/ML injection Inject 1 Units into the skin 3 (three) times daily before meals.    [provider]  Ipratropium-Albuterol (COMBIVENT RESPIMAT) 20-100 MCG/ACT AERS respimat Inhale 1 puff into the lungs every 6 (six) hours as needed for wheezing or shortness of breath. 05/04/18   Sherryll Burger, Pratik D, DO  ipratropium-albuterol (DUONEB) 0.5-2.5 (3) MG/3ML SOLN Take 3 mLs by nebulization 3 (three) times daily. 04/09/21   Catarina Hartshorn, MD  isosorbide mononitrate (IMDUR) 30 MG 24 hr tablet Take 1 tablet (30 mg total) by mouth 2 (two) times daily. 11/09/22   Sharlene Dory, NP  ketorolac (TORADOL) 10 MG tablet Take 1 tablet (10 mg total) by mouth every 6 (six) hours as needed. 10/28/22   Sabas Sous, MD  nabumetone (RELAFEN) 500 MG tablet Take 500 mg by mouth 2 (two) times daily. 07/06/22   [provider]  ondansetron (ZOFRAN) 4 MG tablet Take 4 mg by mouth 4 (four) times daily. 07/13/22   [provider]  OZEMPIC, 1 MG/DOSE, 4 MG/3ML SOPN Inject 1 mg into the skin once a week. 07/13/22   [provider]  potassium chloride SA (KLOR-CON M) 20 MEQ tablet Take 1 tablet (20 mEq total) by mouth daily. 11/09/22   Sharlene Dory, NP  sacubitril-valsartan (ENTRESTO) 97-103 MG Take 1 tablet by mouth 2 (two) times daily. 11/09/22   Sharlene Dory, NP  spironolactone (ALDACTONE) 25 MG tablet Take 1 tablet (25 mg total) by mouth daily. 11/09/22   Sharlene Dory, NP  torsemide (DEMADEX) 20 MG tablet Take 2 tablets (40 mg total) by mouth 2 (two) times daily. 11/09/22 11/04/23  Sharlene Dory, NP  traZODone (DESYREL) 100 MG tablet Take 2 tablets (200 mg total) by mouth at bedtime. 10/29/18   Vassie Loll, MD      Allergies    Diclofenac, Tramadol, and Vicodin [hydrocodone-acetaminophen]    Review of Systems   Review of Systems  Physical Exam Updated Vital Signs BP (!) 145/88   Pulse 81   Temp 98.2 F (36.8 C) (Oral)   Resp 14   Ht 5\' 6"  (1.676 m)   Wt 101.2 kg   LMP  08/21/2019   SpO2 97%   BMI 35.99 kg/m  Physical Exam Vitals and nursing note reviewed.  Constitutional:      General: She is not in acute distress.    Appearance: She is well-developed.  HENT:     Head: Normocephalic and atraumatic.     Right Ear: External ear normal.     Left Ear: External ear normal.     Nose: Nose normal.  Eyes:     Extraocular Movements: Extraocular movements intact.     Conjunctiva/sclera: Conjunctivae normal.     Pupils: Pupils are equal, round, and reactive to light.  Cardiovascular:     Rate and Rhythm: Normal rate and regular rhythm.  Heart sounds: No murmur heard. Pulmonary:     Effort: Pulmonary effort is normal. No respiratory distress.     Breath sounds: Normal breath sounds.  Abdominal:     General: Abdomen is flat. There is no distension.     Palpations: Abdomen is soft. There is no mass.     Tenderness: There is no abdominal tenderness. There is no guarding.  Musculoskeletal:     Cervical back: Normal range of motion and neck supple.     Right lower leg: No edema.     Left lower leg: No edema.     Comments: No thoracic or lumbar spinal midline tenderness to palpation  Skin:    General: Skin is warm and dry.  Neurological:     Mental Status: She is alert and oriented to person, place, and time. Mental status is at baseline.  Psychiatric:        Mood and Affect: Mood normal.     ED Results / Procedures / Treatments   Labs (all labs ordered are listed, but only abnormal results are displayed) Labs Reviewed  CBC WITH DIFFERENTIAL/PLATELET - Abnormal; Notable for the following components:      Result Value   Hemoglobin 10.5 (*)    HCT 33.2 (*)    MCV 76.7 (*)    MCH 24.2 (*)    All other components within normal limits  BASIC METABOLIC PANEL - Abnormal; Notable for the following components:   Potassium 3.1 (*)    Glucose, Bld 131 (*)    BUN 23 (*)    Creatinine, Ser 1.13 (*)    Calcium 8.1 (*)    All other components within  normal limits  BRAIN NATRIURETIC PEPTIDE - Abnormal; Notable for the following components:   B Natriuretic Peptide 2,673.0 (*)    All other components within normal limits  RESP PANEL BY RT-PCR (RSV, FLU A&B, COVID)  [id number]    EKG EKG Interpretation Date/Time:  Tuesday November 16 2022 09:04:14 EDT Ventricular Rate:  88 PR Interval:  194 QRS Duration:  121 QT Interval:  427 QTC Calculation: 517 R Axis:   -66  Text Interpretation: Sinus rhythm Probable left atrial enlargement Nonspecific IVCD with LAD Nonspecific T abnormalities, lateral leads Confirmed by Vonita Moss [id number]) on 11/16/2022 9:25:25 AM  Radiology DG Chest 2 View  Result Date: 11/16/2022 CLINICAL DATA:  Cough. Difficulty sleeping for 2 weeks. Left back pain. EXAM: CHEST - 2 VIEW COMPARISON:  None Available. FINDINGS: There is cardiac enlargement. No sign of pleural effusion. Asymmetric opacity within the right middle and lower lobes. Is best appreciated on the lateral projection radiograph and is concerning for pneumonia. Left lung appears clear. Visualized osseous structures are unremarkable. IMPRESSION: 1. Right middle and right lower lobe opacities concerning for pneumonia. 2. Cardiomegaly. Electronically Signed   By: Signa Kell M.D.   On: 11/16/2022 08:47    Procedures Procedures    Medications Ordered in ED Medications  isosorbide mononitrate (IMDUR) 24 hr tablet 30 mg (30 mg Oral Given 11/16/22 0914)  torsemide (DEMADEX) tablet 20 mg (20 mg Oral Given 11/16/22 0914)  hydrALAZINE (APRESOLINE) tablet 100 mg (100 mg Oral Given 11/16/22 0908)  HYDROmorphone (DILAUDID) injection 0.5 mg (0.5 mg Intravenous Given 11/16/22 0908)  amLODipine (NORVASC) tablet 10 mg (10 mg Oral Given 11/16/22 0908)  carvedilol (COREG) tablet 25 mg (25 mg Oral Given 11/16/22 0908)  spironolactone (ALDACTONE) tablet 25 mg (25 mg Oral Given 11/16/22 0915)  cefTRIAXone (ROCEPHIN) 1 g in sodium  chloride 0.9 % 100 mL IVPB (0 g Intravenous Stopped 11/16/22  0947)  azithromycin (ZITHROMAX) tablet 500 mg (500 mg Oral Given 11/16/22 1610)    ED Course/ Medical Decision Making/ A&P Clinical Course as of 11/16/22 1254  Tue Nov 16, 2022  1040 B Natriuretic Peptide(!): 2,673.0 Improved from prior [RP]    Clinical Course User Index [RP] Rondel Baton, MD                             Medical Decision Making Amount and/or Complexity of Data Reviewed Labs: ordered. Decision-making details documented in ED Course. Radiology: ordered.  Risk Prescription drug management.   Chelsey Santiago is a 47 y.o. female with comorbidities that complicate the patient evaluation including heart failure with reduced ejection fraction, hypertension, diabetic neuropathy who presents to the emergency department with side pain and cough.    Initial Ddx:  Pneumonia, URI, PE, lumbar radiculopathy, MSK strain, spinal cord compression  MDM/Course:  Initial is concerned about possible pneumonia or URI causing the patient's symptoms.  Chest x-ray did confirm that she had a right-sided pneumonia.  Also obtained a BNP which was elevated but at her baseline due to concerns for heart failure exacerbation.  Was given her home torsemide and antihypertensives with improvement of her blood pressure from the 180s systolic to 140s.  No significant pulmonary edema that would be concerning for heart failure exacerbation and being the primary driver of this.  Will have her continue her diuretics at home along with antibiotics for heart failure exacerbation.  In terms of her subacute to chronic back pain.  No red flags today.  Do suspect that she likely has radiculopathy given the numbness that goes down her left lateral thigh and numbness on her left abdomen.  With her recent reassuring workups including a CT stone study which would have image the thoracic spine where she was having the pain as well as her MRI of the lumbar spine do not feel additional imaging is warranted at this time.   Upon re-evaluation was doing well and was requesting to go home.  Remained stable on room air so do not feel that she needs admission for her pneumonia.  Discharged home with Augmentin and azithromycin to treat the pneumonia as well as cyclobenzaprine for her back pain.  Does have a pain management clinic that she will follow-up with with regards to her back pain.  Will also have her follow-up with her primary doctor in several days.  This patient presents to the ED for concern of complaints listed in HPI, this involves an extensive number of treatment options, and is a complaint that carries with it a high risk of complications and morbidity. Disposition including potential need for admission considered.   Dispo: DC Home. Return precautions discussed including, but not limited to, those listed in the AVS. Allowed pt time to ask questions which were answered fully prior to dc.  Records reviewed Outpatient Clinic Notes The following labs were independently interpreted: Chemistry and show CKD I independently reviewed the following imaging with scope of interpretation limited to determining acute life threatening conditions related to emergency care: Chest x-ray and agree with the radiologist interpretation with the following exceptions: none I personally reviewed and interpreted cardiac monitoring: normal sinus rhythm  I personally reviewed and interpreted the pt's EKG: see above for interpretation  I have reviewed the patients home medications and made adjustments as needed Social Determinants of health:  none         Final Clinical Impression(s) / ED Diagnoses Final diagnoses:  Acute cough  Pneumonia of right lung due to infectious organism, unspecified part of lung  Radiculopathy, unspecified spinal region    Rx / DC Orders ED Discharge Orders          Ordered    amoxicillin-clavulanate (AUGMENTIN) 875-125 MG tablet  Every 12 hours        11/16/22 1247    azithromycin (ZITHROMAX)  250 MG tablet  Daily        11/16/22 1247    cyclobenzaprine (FLEXERIL) 10 MG tablet  2 times daily PRN        11/16/22 1247              Rondel Baton, MD 11/16/22 1254

## 2022-11-19 ENCOUNTER — Encounter (HOSPITAL_COMMUNITY): Payer: Medicaid Other | Admitting: Occupational Therapy

## 2022-11-19 ENCOUNTER — Telehealth (HOSPITAL_COMMUNITY): Payer: Self-pay | Admitting: Occupational Therapy

## 2022-11-19 NOTE — Telephone Encounter (Signed)
Called pt regarding no-show for 7/5 appt. Pt forgot about her appointment. Rescheduled for 7/10.    Ezra Sites, OTR/L  [phone]

## 2022-11-24 ENCOUNTER — Ambulatory Visit (HOSPITAL_COMMUNITY): Payer: Medicaid Other | Attending: Orthopaedic Surgery | Admitting: Occupational Therapy

## 2022-11-24 ENCOUNTER — Ambulatory Visit: Payer: Medicaid Other | Admitting: Orthopaedic Surgery

## 2022-11-24 ENCOUNTER — Encounter: Payer: Self-pay | Admitting: Orthopaedic Surgery

## 2022-11-24 ENCOUNTER — Encounter (HOSPITAL_COMMUNITY): Payer: Self-pay | Admitting: Occupational Therapy

## 2022-11-24 VITALS — BP 188/140 | HR 88 | Ht 66.0 in | Wt 220.0 lb

## 2022-11-24 DIAGNOSIS — M25511 Pain in right shoulder: Secondary | ICD-10-CM | POA: Diagnosis present

## 2022-11-24 DIAGNOSIS — M7062 Trochanteric bursitis, left hip: Secondary | ICD-10-CM | POA: Diagnosis not present

## 2022-11-24 DIAGNOSIS — R29898 Other symptoms and signs involving the musculoskeletal system: Secondary | ICD-10-CM | POA: Diagnosis present

## 2022-11-24 DIAGNOSIS — M25611 Stiffness of right shoulder, not elsewhere classified: Secondary | ICD-10-CM | POA: Diagnosis present

## 2022-11-24 DIAGNOSIS — M5442 Lumbago with sciatica, left side: Secondary | ICD-10-CM

## 2022-11-24 DIAGNOSIS — G8929 Other chronic pain: Secondary | ICD-10-CM | POA: Diagnosis not present

## 2022-11-24 MED ORDER — OXYCODONE-ACETAMINOPHEN 5-325 MG PO TABS: 1.0000 | ORAL_TABLET | Freq: Four times a day (QID) | ORAL | 0 refills | Status: AC | PRN

## 2022-11-24 NOTE — Addendum Note (Signed)
Addended by: Michaele Offer on: 11/24/2022 01:21 PM   Modules accepted: Orders

## 2022-11-24 NOTE — Progress Notes (Signed)
My hip is hurting.  She has developed pain in the lateral left hip.  She has no trauma.  She has pain rolling over on it.  She has no numbness or redness.  She did not get to see Dr. Alvester Morin.  There has been a scheduling problem.  We will call and arrange for her to see him.  Left hip is painful over the lateral trochanteric area.  NV intact. ROM of hip is full.  Limp left.  Encounter Diagnoses  Name Primary?   Trochanteric bursitis, left hip Yes   Chronic left-sided low back pain with left-sided sciatica    PROCEDURE NOTE:  The patient request injection, verbal consent was obtained.  The left trochanteric area of the hip was prepped appropriately after time out was performed.   Sterile technique was observed and injection of 1 cc of DepoMedrol 40 mg with several cc's of plain xylocaine. Anesthesia was provided by ethyl chloride and a 20-gauge needle was used to inject the hip area. The injection was tolerated well.  A band aid dressing was applied.  The patient was advised to apply ice later today and tomorrow to the injection sight as needed.  Get appointment to see Dr. Alvester Morin.  I have reviewed the [place] Controlled Substance Reporting System web site prior to prescribing narcotic medicine for this patient.  Return in six weeks.  Call if any problem.  Precautions discussed.  Electronically Signed Darreld Mclean, MD 7/10/20249:58 AM

## 2022-11-24 NOTE — Therapy (Signed)
OUTPATIENT OCCUPATIONAL THERAPY ORTHO REASSESSMENT & TREATMENT DISCHARGE SUMMARY  Patient Name: Chelsey Santiago MRN: [id number] DOB:[birth year], 47 y.o., female Today's Date: 11/24/2022   END OF SESSION:  OT End of Session - 11/24/22 1105     Visit Number 4    Number of Visits 6    Date for OT Re-Evaluation 11/19/22    Authorization Type [employer]    Authorization Time Period 30 visit limit PT/OT/ST combined    Authorization - Visit Number 4    Authorization - Number of Visits 30    OT Start Time 1034    OT Stop Time 1112    OT Time Calculation (min) 38 min    Activity Tolerance Patient tolerated treatment well    Behavior During Therapy WFL for tasks assessed/performed                Past Medical History:  Diagnosis Date   Anemia    H&H of 10.6/33 and 07/2008 and 11.9/35 and 09/2010   Anxiety    Chronic combined systolic and diastolic CHF (congestive heart failure) (HCC)    a. EF 40-45% by echo in 12/2015 with cath showing normal cors b. EF at 45% by repeat echo in 10/2018 c. EF at 30-35% in 03/2020   Depression with anxiety    Diabetes mellitus without complication (HCC)    Diverticulitis    09/2020   Hypertension    Hypertensive heart disease 2009   Pulmonary edema postpartum; mild to moderate mitral regurgitation when hospitalized for CHF in 2009; Echocardiogram in 12/2009-no MR and normal EF; normal CXR in 09/2010   Migraine headache    Miscarriage 03/19/2013   Obesity 04/16/2009   Osteoarthritis, knee 03/29/2011   Preeclampsia    Pulmonary edema    Sleep apnea    Threatened abortion in early pregnancy 03/15/2013   Past Surgical History:  Procedure Laterality Date   BREAST REDUCTION SURGERY  2002   CARDIAC CATHETERIZATION N/A 12/22/2015   Procedure: Left Heart Cath and Coronary Angiography;  Surgeon: Peter M Swaziland, MD;  Location: [facility] INVASIVE CV LAB;  Service: Cardiovascular;  Laterality: N/A;   CESAREAN SECTION N/A 04/09/2014   Procedure: CESAREAN  SECTION;  Surgeon: Catalina Antigua, MD;  Location: [facility] ORS;  Service: Obstetrics;  Laterality: N/A;   CHOLECYSTECTOMY     HYSTERECTOMY ABDOMINAL WITH SALPINGECTOMY N/A 09/26/2019   Procedure: HYSTERECTOMY ABDOMINAL WITH SALPINGECTOMY;  Surgeon: Lazaro Arms, MD;  Location: [facility] OR[facility];  Service: Gynecology;  Laterality: N/A;   LIPOMA EXCISION Right 09/26/2019   Procedure: EXCISION LIPOMA RIGHT VULVAR;  Surgeon: Lazaro Arms, MD;  Location: [facility];  Service: Gynecology;  Laterality: Right;   Patient Active Problem List   Diagnosis Date Noted   Mixed hyperlipidemia 05/03/2022   Influenza A 04/03/2021   Prolonged QT interval 04/03/2021   Hyponatremia 04/03/2021   Elevated brain natriuretic peptide (BNP) level 04/03/2021   History of diverticulitis 01/08/2021   Vomiting 10/18/2020   Acute diverticulitis 10/10/2020   Chronic anemia 10/07/2020   Pneumonia due to COVID-19 virus 06/09/2020   Acute on chronic respiratory failure with hypoxia (HCC) 06/09/2020   Obstructive sleep apnea 03/29/2020   Flash pulmonary edema (HCC) 03/27/2020   Back pain 12/19/2019   Vaginal discharge 12/19/2019   Vaginal itching 12/19/2019   BV (bacterial vaginosis) 12/19/2019   Chronic hypertension 12/19/2019   Fibroids 09/26/2019   S/P hysterectomy 09/26/2019   Acute blood loss anemia 09/26/2019   Pain of upper abdomen 03/14/2019   Dysphagia  03/14/2019   Gastroesophageal reflux disease 03/14/2019   Obesity, Class II, BMI 35-39.9    Acute exacerbation of CHF (congestive heart failure) (HCC) 10/26/2018   Chronic combined systolic and diastolic CHF (congestive heart failure) (HCC) 06/02/2018   Uncontrolled type 2 diabetes mellitus with hyperglycemia (HCC) 06/02/2018   Hyperglycemia due to diabetes mellitus (HCC) 06/02/2018   Influenza B 05/13/2018   Hypomagnesemia 05/12/2018   Headache 05/12/2018   Upper respiratory tract infection    HCAP (healthcare-associated pneumonia)    Diarrhea 10/17/2017   Bad headache     CHF exacerbation (HCC) 09/29/2017   Iron deficiency anemia 05/16/2017   Vitamin D deficiency 11/25/2016   Iron deficiency 11/25/2016   History of acute myocardial infarction 10/05/2016   CHF (congestive heart failure) (HCC) 05/06/2016   Diabetes mellitus with complication (HCC) 05/06/2016   Leukocytosis 05/06/2016   Neuropathy 05/06/2016   Depression 04/16/2016   Chronic tension-type headache, not intractable 04/16/2016   AKI (acute kidney injury) (HCC)    Hyperkalemia    Nonischemic cardiomyopathy (HCC)    Acute on chronic combined systolic and diastolic ACC/AHA stage C congestive heart failure (HCC) 04/03/2016   Acute on chronic combined systolic and diastolic CHF, NYHA class 4 (HCC) 04/03/2016   Hypertensive emergency 04/03/2016   Cardiomyopathy due to hypertension (HCC) 12/22/2015   Normal coronary arteries 12/22/2015   Troponin level elevated 12/22/2015   NSTEMI (non-ST elevated myocardial infarction) (HCC) 12/20/2015   Dental infection 10/10/2015   Chest pain 09/05/2015   Systolic CHF, chronic (HCC) 09/05/2015   Left lower quadrant abdominal pain    Type 2 diabetes mellitus without complication (HCC) 04/28/2014   Essential hypertension    Resistant hypertension 04/23/2014   Hypertensive urgency 04/22/2014   Acute CHF (HCC) 04/22/2014   S/P cesarean section 04/11/2014   Acute pulmonary edema (HCC) 04/11/2014   Postoperative anemia 04/11/2014   Elevated serum creatinine 04/11/2014   Preeclampsia, severe 04/09/2014   Pre-eclampsia superimposed on chronic hypertension, antepartum 04/08/2014   Dyspnea 04/08/2014   Polyhydramnios in third trimester, antepartum 03/14/2014   Abnormal maternal glucose tolerance, antepartum 03/11/2014   High-risk pregnancy 03/11/2014   Pre-existing essential hypertension complicating pregnancy 03/11/2014   Impaired glucose tolerance during pregnancy, antepartum 11/27/2013   Leiomyoma of uterus 11/22/2013   History of gestational diabetes in  prior pregnancy, currently pregnant in first trimester 11/22/2013   Hx of preeclampsia, prior pregnancy, currently pregnant 11/22/2013   Short interval between pregnancies affecting pregnancy, antepartum 11/22/2013   Supervision of high-risk pregnancy of elderly primigravida (>= 85 years old at delivery), third trimester 11/22/2013   Miscarriage 03/19/2013   Major depressive disorder, single episode, unspecified 09/27/2011   Hypertension    Hypertensive cardiovascular disease    Microcytic anemia    Osteoarthrosis involving lower leg 03/29/2011   Hypokalemia 12/12/2009   OSA on CPAP 12/09/2009   Class 2 severe obesity due to excess calories with serious comorbidity and body mass index (BMI) of 36.0 to 36.9 in adult [facility]) 04/16/2009    PCP: Tresa Res, FNP REFERRING PROVIDER: Dr. Darreld Mclean  ONSET DATE: February 2024  REFERRING DIAG: Chronic right shoulder pain  THERAPY DIAG:  Acute pain of right shoulder  Stiffness of right shoulder, not elsewhere classified  Other symptoms and signs involving the musculoskeletal system  Rationale for Evaluation and Treatment: Rehabilitation  SUBJECTIVE:   SUBJECTIVE STATEMENT: S: "I just can't sleep on it."  PERTINENT HISTORY: Pt is a 47 y/o female s/p fall at a laundry mat in 06/2022  during which she landed on her right arm with elbow braced against the concrete and braced with her LUE. Pt has had pain since this time, had an MRI which showed a partial intrasubstance insertional tear of the supraspinatus tendon anteriorly with probable extension to the bursal surface and mild infraspinatus tendinosis. Pt reports she is supposed to follow up with Dr. Dallas Schimke for a second opinion regarding possible surgical repair.   PRECAUTIONS: None  WEIGHT BEARING RESTRICTIONS: No  PAIN:  Are you having pain? Yes: NPRS scale: 4/10 Pain location: right shoulder Pain description: sore, stabbing with movement Aggravating factors: sleeping on it,  movement Relieving factors: ibuprofen  FALLS: Has patient fallen in last 6 months? Yes. Number of falls 1  PLOF: Independent  PATIENT GOALS: To have less pain in the right arm.   NEXT MD VISIT: None scheduled  OBJECTIVE:   HAND DOMINANCE: Right  ADLs: Overall ADLs: Sleeping is very difficulty as pt is a stomach sleeper. Pt has a lot of difficulty with dressing and bathing, reaching behind her back. Pt has difficulty with functional reaching.   UPPER EXTREMITY ROM:       Assessed in sitting, er/IR adducted  Active ROM Right eval Right 11/24/22  Shoulder flexion 99 143  Shoulder abduction 87 141  Shoulder internal rotation 90 90  Shoulder external rotation 50 50  (Blank rows = not tested)     Passive ROM Right eval  Shoulder flexion 105  Shoulder abduction 99  Shoulder internal rotation 90  Shoulder external rotation 72  (Blank rows = not tested)  UPPER EXTREMITY MMT:     Assessed in sitting, er/IR adducted, observation due to pain and limited ROM  MMT Right eval Right 11/24/22  Shoulder flexion 3/5 4/5  Shoulder abduction 3/5 4/5  Shoulder internal rotation 3/5 5/5  Shoulder external rotation 3/5 4/5  (Blank rows = not tested)  SENSATION: Has tingling in the hands-unsure of due to neuropathy or due to the fall   OBSERVATIONS: Mod fascial restrictions along right upper arm, anterior shoulder, and trapezius regions         Min fascial restrictions along right upper arm, anterior shoulder, and trapezius regions   TODAY'S TREATMENT:                                                                                                                              DATE:  11/24/22 -Shoulder stretches: flexion, doorway stretch, cross chest stretch, IR behind back with towel -A/ROM: standing-protraction, flexion, horizontal abduction, abduction, er, 10 reps -Scapular theraband: red-row, extension, retraction, 10 reps  -Overhead lacing: seated-lacing from top down then  reversing -UBE: level 1, 3' forward 2' reverse, pace: 4.0  11/12/22 -Myofascial release to right upper arm, anterior shoulder, trapezius, and scapular regions to decrease pain and fascial restrictions and increase joint ROM in RUE -P/ROM: supine-flexion, abduction, er/IR, horizontal abduction, 5 reps -A/ROM: supine-protraction, flexion, er/IR, horizontal abduction, abduction, 10 reps -Proximal shoulder  strengthening: supine-paddles, criss cross, circles each direction, 10 reps -Scapular theraband: red-row, extension, retraction, 10 reps -Overhead lacing: seated-lacing from top down then reversing -UBE: level 1, 2' forward, 2' reverse, pace 4.0  10/22/22 -Myofascial release to right upper arm, anterior shoulder, trapezius, and scapular regions to decrease pain and fascial restrictions and increase joint ROM in RUE -P/ROM: supine-flexion, abduction, er/IR, horizontal abduction, 5 reps -A/ROM: supine-protraction, flexion, er/IR, horizontal abduction, abduction, 10 reps -A/ROM: seated-protraction, flexion, er/IR, horizontal abduction, abduction, 5 reps    PATIENT EDUCATION: Education details: shoulder stretches Person educated: Patient Education method: Explanation, Demonstration, and Handouts Education comprehension: verbalized understanding  HOME EXERCISE PROGRAM: Eval: table slides, scapular A/ROM 6/7: A/ROM 6/28: scapular theraband-red 7/10: shoulder stretches  GOALS: Goals reviewed with patient? Yes   SHORT TERM GOALS: Target date: 10/29/22  Pt will be provided with and educated on HEP to improve mobility in RUE required for use during ADL completion.   Goal status: MET  2.  Pt will increase RUE P/ROM by 45 degrees to improve ability to use RUE during dressing tasks with minimal compensatory techniques.   Goal status: MET    LONG TERM GOALS: Target date: 11/19/22  Pt will decrease pain in RUE to 5/10 or less to improve ability to sleep for 2+ consecutive hours without  waking due to pain.   Goal status: NOT MET  2.  Pt will decrease RUE fascial restrictions to min amounts or less to improve mobility required for functional reaching tasks.   Goal status: MET  3.  Pt will increase RUE A/ROM by 45 degrees to improve ability to use RUE when reaching overhead or behind back during dressing and bathing tasks.   Goal status: MET  4.  Pt will increase RUE strength to 4/5 or greater to improve ability to use RUE when lifting or carrying items during meal preparation/housework/yardwork tasks.   Goal status: MET    ASSESSMENT:  CLINICAL IMPRESSION: Pt reports she has no been able to do much of her HEP due to being sick with pneumonia and hip/back pain. Reassessment completed this session, pt reports it is feeling better but she just can't sleep on it. Pt has met all STGs and 3/4 LTGs, demonstrating improved ROM and strength in the RUE. Reviewed HEP including new shoulder stretches, A/ROM, and scapular theraband. Pt reporting her shoulder is feeling better with exercise. Discussed importance of completing HEP at home to loosen up her RUE and lessen her pain.  Verbal cuing for form and technique. Pt is agreeable to discharge today.    PERFORMANCE DEFICITS: in functional skills including in functional skills including ADLs, IADLs, coordination, tone, ROM, strength, pain, fascial restrictions, muscle spasms, and UE functional use   PLAN:  OT FREQUENCY: 1-2x/week  OT DURATION: 6 weeks  PLANNED INTERVENTIONS: self care/ADL training, therapeutic exercise, therapeutic activity, neuromuscular re-education, manual therapy, passive range of motion, splinting, electrical stimulation, ultrasound, moist heat, cryotherapy, patient/family education, and DME and/or AE instructions  CONSULTED AND AGREED WITH PLAN OF CARE: Patient  PLAN FOR NEXT SESSION: Discharge today   OCCUPATIONAL THERAPY DISCHARGE SUMMARY  Visits from Start of Care: 4  Current functional level  related to goals / functional outcomes: See above. Pt reports her arm is feeling better, she still has some pain but she is able to use her RUE for functional tasks. Pt is pleased with current level of functioning and has other medical issues she would like to focus on.    Remaining deficits: Mild ROM  and strength deficits, pain when trying to sleep   Education / Equipment: HEP for stretches and ROM   Patient agrees to discharge. Patient goals were met. Patient is being discharged due to being pleased with the current functional level.Marland Kitchen      Ezra Sites, OTR/L  [phone] 11/24/2022, 11:12 AM

## 2022-11-24 NOTE — Patient Instructions (Signed)
  1) Flexion Wall Stretch    Face wall, place affected handon wall in front of you. Slide hand up the wall  and lean body in towards the wall. Hold for 10 seconds. Repeat 3-5 times. 1-2 times/day.     2) Towel Stretch with Internal Rotation      Gently pull up (or to the side) your affected arm  behind your back with the assist of a towel. Hold 10 seconds, repeat 3-5 times. 1-2 times/day.             3) Corner Stretch    Stand at a corner of a wall, place your arms on the walls with elbows bent. Lean into the corner until a stretch is felt along the front of your chest and/or shoulders. Hold for 10 seconds. Repeat 3-5X, 1-2 times/day.    4) Posterior Capsule Stretch    Bring the involved arm across chest. Grasp elbow and pull toward chest until you feel a stretch in the back of the upper arm and shoulder. Hold 10 seconds. Repeat 3-5X. Complete 1-2 times/day.

## 2022-12-03 ENCOUNTER — Ambulatory Visit (INDEPENDENT_AMBULATORY_CARE_PROVIDER_SITE_OTHER): Payer: Medicaid Other | Admitting: Pulmonary Disease

## 2022-12-03 ENCOUNTER — Ambulatory Visit (HOSPITAL_BASED_OUTPATIENT_CLINIC_OR_DEPARTMENT_OTHER): Payer: Medicaid Other

## 2022-12-03 ENCOUNTER — Encounter (HOSPITAL_BASED_OUTPATIENT_CLINIC_OR_DEPARTMENT_OTHER): Payer: Self-pay | Admitting: Orthopaedic Surgery

## 2022-12-03 ENCOUNTER — Encounter (HOSPITAL_BASED_OUTPATIENT_CLINIC_OR_DEPARTMENT_OTHER): Payer: Self-pay | Admitting: Pulmonary Disease

## 2022-12-03 VITALS — BP 148/90 | HR 83 | Temp 98.1°F | Ht 66.0 in | Wt 221.4 lb

## 2022-12-03 DIAGNOSIS — Z8701 Personal history of pneumonia (recurrent): Secondary | ICD-10-CM

## 2022-12-03 DIAGNOSIS — G4734 Idiopathic sleep related nonobstructive alveolar hypoventilation: Secondary | ICD-10-CM | POA: Diagnosis not present

## 2022-12-03 DIAGNOSIS — G4733 Obstructive sleep apnea (adult) (pediatric): Secondary | ICD-10-CM

## 2022-12-03 NOTE — Patient Instructions (Signed)
Chest xray today  Will schedule CPAP titration study at [facility]  Follow up in 6 weeks

## 2022-12-03 NOTE — Progress Notes (Signed)
[facility]  Chief Complaint  Patient presents with   Follow-up    Discuss HST from 03/2022.  Has not used CPAP in over a year.    Past Surgical History:  She  has a past surgical history that includes Breast reduction surgery (2002); Cesarean section (N/A, 04/09/2014); Cholecystectomy; Cardiac catheterization (N/A, 12/22/2015); Hysterectomy abdominal with salpingectomy (N/A, 09/26/2019); and Lipoma excision (Right, 09/26/2019).  Past Medical History:  COVID PNA January/September/November 2022, Anemia, Anxiety, CHF, HTN, Depression, DM type 2, Diverticulitis, Migraine headache, OA, Preeclampsia  Constitutional:  BP (!) 148/90 (BP Location: Right Arm, Patient Position: Sitting, Cuff Size: Large)   Pulse 83   Temp 98.1 F (36.7 C)   Ht 5\' 6"  (1.676 m)   Wt 221 lb 6.4 oz (100.4 kg)   LMP 08/21/2019   SpO2 98% Comment: room air  BMI 35.73 kg/m   Brief Summary:  Chelsey Santiago is a 47 y.o. female with obstructive sleep apnea.      Subjective:   She was seen in the ER on 11/16/22 for a cough.  Chest xray showed changes of RML and RLL pneumonia.  She was treated with augmentin and zithromax after getting rocephin in the ER.  Breathing and cough better.  Still has some fatigue, but not bad.  Home sleep study showed mild sleep apnea but she had low oxygen level also.  She is working with team in [place] for weight loss surgery.  Needs cardiology clearance prior to getting surgery set up.  Physical Exam:   Appearance - well kempt   ENMT - no sinus tenderness, no oral exudate, no LAN, Mallampati 3 airway, no stridor  Respiratory - equal breath sounds bilaterally, no wheezing or rales  CV - s1s2 regular rate and rhythm, no murmurs  Ext - no clubbing, no edema  Skin - no rashes  Psych - normal mood and affect   Sleep Tests:  PSG 07/04/17 >> AHI 15.8, SpO2 low 77% HST 04/01/22 >> AHI 5.9, SpO2 low 61%. Spent 162.9 min with SpO2 <  89%.   Cardiac Tests:  Echo 04/04/21 >> EF 30 to 35%, mod LVH, grade 2 DD  Social History:  She  reports that she has never smoked. She has never used smokeless tobacco. She reports current alcohol use. She reports that she does not use drugs.  Family History:  Her family history includes ADD / ADHD in her son; Diabetes (age of onset: 59) in her mother; Heart attack in her brother; Heart disease in her father and mother; Heart disease (age of onset: 49) in her maternal grandmother; Hyperlipidemia in her paternal grandfather; Hypertension in her father, maternal uncle, and paternal grandfather.     Assessment/Plan:   Obstructive sleep apnea with sleep related hypoxia. - reviewed sleep study - discussed how sleep apnea can impact her health - treatment options reviewed - since she has hypoxia/hypoventilation in addition to systolic heart failure with a low EF, she will need to have an in-lab CPAP titration study at [facility]  History of pneumonia. - will repeat chest xray today  Obesity. - she is being evaluated for bariatric surgery with [facility]  Insomnia with anxiety and depression. 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 - followed by Dr. Patrick Jupiter with cardiology  Time Spent Involved in Patient Care on Day of Examination:  37 minutes  Follow up:   Patient Instructions  Chest xray today  Will schedule CPAP titration study at [facility]  Follow up in 6 weeks  Medication List:   Allergies as of 12/03/2022       Reactions   Diclofenac Swelling   AND POSSIBLE SYNCOPE; tolerates ibuprofen per pt   Tramadol Nausea And Vomiting, Nausea Only   Itching (12/21); tolerates ibuprofen per pt   Vicodin [hydrocodone-acetaminophen] Itching, Nausea Only        Medication List        Accurate as of December 03, 2022 11:12 AM. If you have any questions, ask your nurse or doctor.          STOP  taking these medications    amoxicillin-clavulanate 875-125 MG tablet Commonly known as: AUGMENTIN Stopped by: Konner Warrior   azithromycin 250 MG tablet Commonly known as: ZITHROMAX Stopped by: Coralyn Helling       TAKE these medications    Accu-Chek Guide test strip Generic drug: glucose blood USE AS DIRECTED TO TESTCBLOOD SUGAR TWICE DAILY.   albuterol 108 (90 Base) MCG/ACT inhaler Commonly known as: VENTOLIN HFA Inhale 1 puff into the lungs every 6 (six) hours as needed for wheezing or shortness of breath.   amLODipine 10 MG tablet Commonly known as: NORVASC Take 1 tablet (10 mg total) by mouth daily.   aspirin EC 81 MG tablet Take 81 mg by mouth daily.   budesonide-formoterol 80-4.5 MCG/ACT inhaler Commonly known as: Symbicort Inhale 2 puffs into the lungs 2 (two) times daily.   carvedilol 25 MG tablet Commonly known as: COREG Take 1 tablet (25 mg total) by mouth 2 (two) times daily with a meal.   clonazePAM 0.5 MG tablet Commonly known as: KLONOPIN Take 0.5 mg by mouth 2 (two) times daily.   cyclobenzaprine 10 MG tablet Commonly known as: FLEXERIL Take 1 tablet (10 mg total) by mouth 2 (two) times daily as needed for muscle spasms.   dapagliflozin propanediol 10 MG Tabs tablet Commonly known as: Farxiga Take 1 tablet (10 mg total) by mouth daily before breakfast.   Entresto 97-103 MG Generic drug: sacubitril-valsartan Take 1 tablet by mouth 2 (two) times daily.   gabapentin 800 MG tablet Commonly known as: NEURONTIN Take 800 mg by mouth 3 (three) times daily.   hydrALAZINE 100 MG tablet Commonly known as: APRESOLINE Take 1 tablet (100 mg total) by mouth 3 (three) times daily.   HYDROcodone-acetaminophen 5-325 MG tablet Commonly known as: NORCO/VICODIN One tablet every six hours for pain.  Limit 7 days.   ibuprofen 600 MG tablet Commonly known as: ADVIL Take 1 tablet (600 mg total) by mouth every 6 (six) hours as needed.   insulin lispro 100  UNIT/ML injection Commonly known as: HUMALOG Inject 1 Units into the skin 3 (three) times daily before meals.   Ipratropium-Albuterol 20-100 MCG/ACT Aers respimat Commonly known as: Combivent Respimat Inhale 1 puff into the lungs every 6 (six) hours as needed for wheezing or shortness of breath.   ipratropium-albuterol 0.5-2.5 (3) MG/3ML Soln Commonly known as: DUONEB Take 3 mLs by nebulization 3 (three) times daily.   isosorbide mononitrate 30 MG 24 hr tablet Commonly known as: IMDUR Take 1 tablet (30 mg total) by mouth 2 (two) times daily.   ketorolac 10 MG tablet Commonly known as: TORADOL Take 1 tablet (10 mg total) by mouth every 6 (six) hours as needed.   Lantus SoloStar 100 UNIT/ML Solostar Pen Generic drug: insulin glargine Inject 40 Units into the skin at bedtime.   nabumetone 500 MG  tablet Commonly known as: RELAFEN Take 500 mg by mouth 2 (two) times daily.   ondansetron 4 MG tablet Commonly known as: ZOFRAN Take 4 mg by mouth 4 (four) times daily.   Ozempic (1 MG/DOSE) 4 MG/3ML Sopn Generic drug: Semaglutide (1 MG/DOSE) Inject 1 mg into the skin once a week.   potassium chloride SA 20 MEQ tablet Commonly known as: KLOR-CON M Take 1 tablet (20 mEq total) by mouth daily.   spironolactone 25 MG tablet Commonly known as: ALDACTONE Take 1 tablet (25 mg total) by mouth daily.   torsemide 20 MG tablet Commonly known as: DEMADEX Take 2 tablets (40 mg total) by mouth 2 (two) times daily.   traZODone 100 MG tablet Commonly known as: DESYREL Take 2 tablets (200 mg total) by mouth at bedtime.        Signature:  Coralyn Helling, MD [facility] Pulmonary/Critical Care Pager - [phone] 12/03/2022, 11:12 AM

## 2022-12-07 ENCOUNTER — Other Ambulatory Visit: Payer: Self-pay

## 2022-12-07 ENCOUNTER — Telehealth: Payer: Self-pay | Admitting: Orthopaedic Surgery

## 2022-12-07 ENCOUNTER — Ambulatory Visit (INDEPENDENT_AMBULATORY_CARE_PROVIDER_SITE_OTHER): Payer: Medicaid Other | Admitting: Physical Medicine and Rehabilitation

## 2022-12-07 VITALS — BP 162/110 | HR 89

## 2022-12-07 DIAGNOSIS — M47816 Spondylosis without myelopathy or radiculopathy, lumbar region: Secondary | ICD-10-CM

## 2022-12-07 MED ORDER — METHYLPREDNISOLONE ACETATE 80 MG/ML IJ SUSP
80.0000 mg | Freq: Once | INTRAMUSCULAR | Status: AC
Start: 2022-12-07 — End: 2022-12-07
  Administered 2022-12-07: 80 mg

## 2022-12-07 NOTE — Patient Instructions (Signed)

## 2022-12-07 NOTE — Telephone Encounter (Signed)
I see no mention of MRI  To Dr Hilda Lias She is requesting MRI left hip  Do you think she needs that?

## 2022-12-07 NOTE — Telephone Encounter (Signed)
Dr. Sanjuan Dame pt - pt lvm stating that on her visit on 11/24/22 she asked for a referral for a MRI of her left leg/hip and she's just realizing she didn't get that.  She would like a referral for a MRI.  [phone]

## 2022-12-07 NOTE — Telephone Encounter (Signed)
I called her to advise. Left message for her to call back

## 2022-12-07 NOTE — Progress Notes (Signed)
Functional Pain Scale - descriptive words and definitions  Unmanageable (7)  Pain interferes with normal ADL's/nothing seems to help/sleep is very difficult/active distractions are very difficult to concentrate on. Severe range order  Average Pain 9   +Driver, -BT, -Dye Allergies.  Lower back pain on both sides that radiates into the legs

## 2022-12-14 ENCOUNTER — Ambulatory Visit: Payer: Medicaid Other | Admitting: Orthopaedic Surgery

## 2022-12-14 ENCOUNTER — Encounter: Payer: Self-pay | Admitting: Orthopaedic Surgery

## 2022-12-14 ENCOUNTER — Other Ambulatory Visit: Payer: Self-pay

## 2022-12-14 VITALS — BP 196/130 | HR 88 | Ht 66.0 in | Wt 214.0 lb

## 2022-12-14 DIAGNOSIS — M25552 Pain in left hip: Secondary | ICD-10-CM | POA: Diagnosis not present

## 2022-12-14 DIAGNOSIS — G8929 Other chronic pain: Secondary | ICD-10-CM

## 2022-12-14 MED ORDER — HYDROCODONE-ACETAMINOPHEN 5-325 MG PO TABS: ORAL_TABLET | ORAL | 0 refills | Status: DC

## 2022-12-14 NOTE — Patient Instructions (Signed)
While we are working on your approval for MRI please go ahead and call to schedule your appointment with [facility] within at least one (1) week.   Central Scheduling [phone]  

## 2022-12-14 NOTE — Progress Notes (Signed)
My back is better but my hip hurts bad  She has had epidural by Dr. Alvester Morin and did well with that.  I have reviewed her notes.  She has more pain of the left hip.  She limps.  She has not improved.  The injection only helped a little for the hip.  ROM of the left hip is decreased. She has a slight limp to the left.  NV intact.    Encounter Diagnosis  Name Primary?   Chronic left hip pain Yes   I will get MRI of the left hip.  I will renew her pain medicine.  Return in one month.  Call if any problem.  Precautions discussed.  Electronically Signed Darreld Mclean, MD 7/30/20249:36 AM

## 2022-12-14 NOTE — Telephone Encounter (Signed)
Hydrocodone-Acetaminophen 5/325 MG Qty 28 Tablets  PATIENT USES [facility]

## 2022-12-15 ENCOUNTER — Other Ambulatory Visit: Payer: Medicaid Other

## 2022-12-15 ENCOUNTER — Other Ambulatory Visit: Payer: Self-pay | Admitting: Orthopaedic Surgery

## 2022-12-15 ENCOUNTER — Ambulatory Visit: Payer: Medicaid Other | Attending: Nurse Practitioner

## 2022-12-15 DIAGNOSIS — I350 Nonrheumatic aortic (valve) stenosis: Secondary | ICD-10-CM

## 2022-12-15 NOTE — Procedures (Signed)
Lumbar Facet Joint Intra-Articular Injection(s) with Fluoroscopic Guidance  Patient: Chelsey Santiago      Date of Birth: 27-Aug-1975 MRN: [id number] PCP: Golden Pop, FNP      Visit Date: 12/07/2022   Universal Protocol:    Date/Time: 12/07/2022  Consent Given By: the patient  Position: PRONE   Additional Comments: Vital signs were monitored before and after the procedure. Patient was prepped and draped in the usual sterile fashion. The correct patient, procedure, and site was verified.   Injection Procedure Details:  Procedure Site One Meds Administered:  Meds ordered this encounter  Medications   methylPREDNISolone acetate (DEPO-MEDROL) injection 80 mg     Laterality: Bilateral  Location/Site:  L4-L5  Needle size: 22 guage  Needle type: Spinal  Needle Placement: Articular  Findings:  -Comments: Excellent flow of contrast producing a partial arthrogram.  Procedure Details: The fluoroscope beam is vertically oriented in AP, and the inferior recess is visualized beneath the lower pole of the inferior apophyseal process, which represents the target point for needle insertion. When direct visualization is difficult the target point is located at the medial projection of the vertebral pedicle. The region overlying each aforementioned target is locally anesthetized with a 1 to 2 ml. volume of 1% Lidocaine without Epinephrine.   The spinal needle was inserted into each of the above mentioned facet joints using biplanar fluoroscopic guidance. A 0.25 to 0.5 ml. volume of Isovue-250 was injected and a partial facet joint arthrogram was obtained. A single spot film was obtained of the resulting arthrogram.    One to 1.25 ml of the steroid/anesthetic solution was then injected into each of the facet joints noted above.   Additional Comments:  No complications occurred Dressing: 2 x 2 sterile gauze and Band-Aid    Post-procedure details: Patient was observed during the  procedure. Post-procedure instructions were reviewed.  Patient left the clinic in stable condition.

## 2022-12-15 NOTE — Telephone Encounter (Signed)
Chelsey Santiago sent refill request yesterday to the pool for Hydrocodone, patient states it makes her itch and she is taking Percocet  Wants it sent in

## 2022-12-15 NOTE — Telephone Encounter (Signed)
DR. Hilda Lias   Patient called left voicemail and states that the wrong pain medicine was called into [facility].  She request a call back [phone]

## 2022-12-15 NOTE — Progress Notes (Signed)
Chelsey Santiago - 47 y.o. female MRN [id number]  Date of birth: [birth year]  Office Visit Note: Visit Date: 12/07/2022 PCP: Golden Pop, FNP Referred by: Golden Pop, FNP  Subjective: Chief Complaint  Patient presents with   Lower Back - Pain   HPI:  Chelsey Santiago is a 47 y.o. female who comes in today at the request of Dr. Darreld Mclean for planned Bilateral  L4-5 Lumbar facet/medial branch block with fluoroscopic guidance.  The patient has failed conservative care including home exercise, medications, time and activity modification.  This injection will be diagnostic and hopefully therapeutic.  Please see requesting physician notes for further details and justification.  Exam has shown concordant pain with facet joint loading.   ROS Otherwise per HPI.  Assessment & Plan: Visit Diagnoses:    ICD-10-CM   1. Spondylosis without myelopathy or radiculopathy, lumbar region  M47.816 XR C-ARM NO REPORT    Facet Injection    methylPREDNISolone acetate (DEPO-MEDROL) injection 80 mg      Plan: No additional findings.   Meds & Orders:  Meds ordered this encounter  Medications   methylPREDNISolone acetate (DEPO-MEDROL) injection 80 mg    Orders Placed This Encounter  Procedures   Facet Injection   XR C-ARM NO REPORT    Follow-up: Return for visit to requesting provider as needed.   Procedures: No procedures performed  Lumbar Facet Joint Intra-Articular Injection(s) with Fluoroscopic Guidance  Patient: Chelsey Santiago      Date of Birth: 1975-10-08 MRN: [id number] PCP: Golden Pop, FNP      Visit Date: 12/07/2022   Universal Protocol:    Date/Time: 12/07/2022  Consent Given By: the patient  Position: PRONE   Additional Comments: Vital signs were monitored before and after the procedure. Patient was prepped and draped in the usual sterile fashion. The correct patient, procedure, and site was verified.   Injection Procedure Details:  Procedure Site  One Meds Administered:  Meds ordered this encounter  Medications   methylPREDNISolone acetate (DEPO-MEDROL) injection 80 mg     Laterality: Bilateral  Location/Site:  L4-L5  Needle size: 22 guage  Needle type: Spinal  Needle Placement: Articular  Findings:  -Comments: Excellent flow of contrast producing a partial arthrogram.  Procedure Details: The fluoroscope beam is vertically oriented in AP, and the inferior recess is visualized beneath the lower pole of the inferior apophyseal process, which represents the target point for needle insertion. When direct visualization is difficult the target point is located at the medial projection of the vertebral pedicle. The region overlying each aforementioned target is locally anesthetized with a 1 to 2 ml. volume of 1% Lidocaine without Epinephrine.   The spinal needle was inserted into each of the above mentioned facet joints using biplanar fluoroscopic guidance. A 0.25 to 0.5 ml. volume of Isovue-250 was injected and a partial facet joint arthrogram was obtained. A single spot film was obtained of the resulting arthrogram.    One to 1.25 ml of the steroid/anesthetic solution was then injected into each of the facet joints noted above.   Additional Comments:  No complications occurred Dressing: 2 x 2 sterile gauze and Band-Aid    Post-procedure details: Patient was observed during the procedure. Post-procedure instructions were reviewed.  Patient left the clinic in stable condition.    Clinical History: MRI LUMBAR SPINE WITHOUT CONTRAST   TECHNIQUE: Multiplanar, multisequence MR imaging of the lumbar spine was performed. No intravenous contrast was administered.   COMPARISON:  Subsequent 10/28/2022  CT Abdomen and Pelvis. Lumbar radiographs 05/12/2015.   FINDINGS: Segmentation:  Normal on the comparisons.   Alignment: Stable lumbar lordosis since 2016. No significant scoliosis. Subtle spondylolisthesis at both L3-L4 and  L4-L5.   Vertebrae: No marrow edema or evidence of acute osseous abnormality. Visualized bone marrow signal is within normal limits. Intact visible sacrum and SI joints.   Conus medullaris and cauda equina: Conus extends to the L1 level. No lower spinal cord or conus signal abnormality. Unremarkable cauda equina nerve roots, and capacious spinal canal at most levels.   Paraspinal and other soft tissues: Negative.   Disc levels:   T11-T12: Disc desiccation and disc space loss. Circumferential disc bulge. Mild to moderate facet and ligament flavum hypertrophy. No spinal stenosis. Mild bilateral T11 foraminal stenosis.   T12-L1:  Negative.   L1-L2:  Subtle disc bulging.  Mild facet hypertrophy.  No stenosis.   L2-L3: Subtle disc desiccation and disc bulging. Mild to moderate facet hypertrophy. No stenosis.   L3-L4: Subtle retrolisthesis. Disc desiccation. Mild circumferential disc bulge. Mild to moderate facet and ligament flavum hypertrophy. No spinal or lateral recess stenosis. Mild to moderate bilateral L3 neural foraminal stenosis, fairly symmetric.   L4-L5: Subtle anterolisthesis. Broad-based posterior disc bulge or protrusion. Small left foraminal annular fissure. Moderate to severe facet hypertrophy. Trace degenerative facet joint fluid. Mild ligament flavum hypertrophy. No significant spinal stenosis. No convincing lateral recess stenosis. Moderate to severe left, mild right L4 neural foraminal stenosis, due to combined foraminal disc and facet hypertrophy.   L5-S1: Largely normal disc. Moderate to severe facet hypertrophy greater on the left. No spinal or lateral recess stenosis. Moderate left neural foraminal stenosis. No convincing right foraminal stenosis.   IMPRESSION: 1. No acute osseous abnormality in the lumbar spine. Lumbar spine degeneration with facet arthropathy more so than disc degeneration. Subtle spondylolisthesis at both L3-L4 and L4-L5.   2. No  lumbar spinal or lateral recess stenosis. Disc bulging and facet disease combine for up to moderate bilateral L3, moderate to severe left L4, and moderate left L5 neural foraminal stenosis.     Electronically Signed   By: Odessa Fleming M.D.   On: 10/28/2022 06:09     Objective:  VS:  HT:    WT:   BMI:     BP:(!) 162/110  HR:89bpm  TEMP: ( )  RESP:  Physical Exam Vitals and nursing note reviewed.  Constitutional:      General: She is not in acute distress.    Appearance: Normal appearance. She is not ill-appearing.  HENT:     Head: Normocephalic and atraumatic.     Right Ear: External ear normal.     Left Ear: External ear normal.  Eyes:     Extraocular Movements: Extraocular movements intact.  Cardiovascular:     Rate and Rhythm: Normal rate.     Pulses: Normal pulses.  Pulmonary:     Effort: Pulmonary effort is normal. No respiratory distress.  Abdominal:     General: There is no distension.     Palpations: Abdomen is soft.  Musculoskeletal:        General: Tenderness present.     Cervical back: Neck supple.     Right lower leg: No edema.     Left lower leg: No edema.     Comments: Patient has good distal strength with no pain over the greater trochanters.  No clonus or focal weakness.  Skin:    Findings: No erythema, lesion or rash.  Neurological:     General: No focal deficit present.     Mental Status: She is alert and oriented to person, place, and time.     Sensory: No sensory deficit.     Motor: No weakness or abnormal muscle tone.     Coordination: Coordination normal.  Psychiatric:        Mood and Affect: Mood normal.        Behavior: Behavior normal.      Imaging: No results found.

## 2022-12-16 NOTE — Telephone Encounter (Signed)
Dr. Sanjuan Dame pt - spoke w/the patient, she was checking to see if the Percocet has been sent in.  I'm not seeing that it has.  Since Dr. Hilda Lias is off, is it possible Dr. Romeo Apple could send this in?

## 2022-12-17 MED ORDER — OXYCODONE-ACETAMINOPHEN 5-325 MG PO TABS: 1.0000 | ORAL_TABLET | Freq: Two times a day (BID) | ORAL | 0 refills | Status: DC | PRN

## 2022-12-17 NOTE — Telephone Encounter (Signed)
DR. Anastasio Auerbach  I have a patient that is irate and cussing at me. Because we called in the wrong medicine in on 12/14/22 and we closed the door before 5:00 and the lights was off and she said she had to scream thru the door. She can't take  Hydrocodone  she is allergic to it and she is suppose to get PERCOCET.    Kathie Rhodes took the message and sent it to Dr. Hilda Lias Dr. Hilda Lias sent it to Amy on 12/14/22   She called again on 12/15/22 you can see in the notes I have talked to her and Amy sent it to Dr. Hilda Lias.  She has called back several times that day and she spoke with [facility] too.  She also came in the office upset about it and I advised her we have sent it to the assistance in the back.  She said ok.   She is cussing and mad she is in pain and she can't get her pain medicine and she said Dr. Hilda Lias said if he is out of town then one of the other doctors can fill it and she wants it FILLED TODAY she is in a lot of pain and this is wrong to make her suffer like this!!!!!   This makes the practice look bad!!!  I advised her I will get my supervisor to give her a call back ...  She cussed me out and hung up.

## 2022-12-17 NOTE — Telephone Encounter (Signed)
DR. Hilda Lias   Patient has called back again wanting to know about her pain medicine.  No on has called her back and she knows we close at 5:00.  She guess she will have to go thru the weekend with no pain medicine and she can't do that.  Patient states Dr. Hilda Lias told that girl that was with him to call it in for her and she didn't, that is when she came back up here and the door was locked and lights off before 5:00.

## 2022-12-20 ENCOUNTER — Telehealth: Payer: Self-pay | Admitting: Nurse Practitioner

## 2022-12-20 DIAGNOSIS — I5022 Chronic systolic (congestive) heart failure: Secondary | ICD-10-CM

## 2022-12-20 NOTE — Telephone Encounter (Signed)
-----   Message from [facility] sent at 12/18/2022  6:42 AM EDT ----- Heart pumping function remains stable at 30 to 35%, this is reduced.  Her aortic valve remains mildly narrowed with mean gradient measuring 10.3 mmHg.  This is stable from 2 years ago. Because of her stable reduced pumping function, I would like to refer her to the heart failure clinic. Would she be agreeable to see them in [place]? If so, let's please arrange this. If not, continue current treatment plan and follow-up.   Thanks!  Sharlene Dory, AGNP-C

## 2022-12-21 ENCOUNTER — Ambulatory Visit (HOSPITAL_COMMUNITY)
Admission: RE | Admit: 2022-12-21 | Discharge: 2022-12-21 | Disposition: A | Discharge status: Home / Self Care | Payer: Medicaid Other | Source: Ambulatory Visit | Attending: Orthopaedic Surgery | Admitting: Orthopaedic Surgery

## 2022-12-21 ENCOUNTER — Encounter: Payer: Self-pay | Admitting: *Deleted

## 2022-12-21 ENCOUNTER — Telehealth: Payer: Self-pay | Admitting: Cardiology

## 2022-12-21 DIAGNOSIS — G8929 Other chronic pain: Secondary | ICD-10-CM | POA: Diagnosis present

## 2022-12-21 DIAGNOSIS — M25552 Pain in left hip: Secondary | ICD-10-CM | POA: Diagnosis not present

## 2022-12-21 MED ORDER — NITROGLYCERIN 0.4 MG SL SUBL: 0.4000 mg | SUBLINGUAL_TABLET | SUBLINGUAL | 3 refills | Status: AC | PRN

## 2022-12-21 NOTE — Telephone Encounter (Signed)
Pt is requesting a callback regarding her wanting to discuss being back on nitroGLYCERIN (NITROSTAT) . Please advise

## 2022-12-21 NOTE — Telephone Encounter (Signed)
After further questioning, states she had only been taking the Imdur daily as she says she has some left over from before.  Was asking for a refill on the Nitroglycerin as she was having some extra chest pain.  Explained to her that her dose was just recently increased and needed to be taking twice a day (12 hours apart).  States she will get new prescription filled at pharmacy & start taking correctly.  States her BP has been elevated but was yelling at the kids at the time she took it.  Reviewed proper BP taking technique and advised to monitor & if continue to stay elevated (consistently over 130, she should let us know).  Will fwd message to provider for okay on the Nitroglycerin refill as no longer on her medication list.

## 2022-12-21 NOTE — Telephone Encounter (Signed)
Patient notified via mychart

## 2022-12-22 ENCOUNTER — Telehealth: Payer: Self-pay | Admitting: Orthopaedic Surgery

## 2022-12-22 NOTE — Telephone Encounter (Signed)
Dr. Sanjuan Dame pt - pt lvm stating that she would like the remainder of her pain meds called in, Oxycodone and she wants to discuss her shoulder surgery with him.

## 2022-12-23 ENCOUNTER — Telehealth (HOSPITAL_COMMUNITY): Payer: Self-pay | Admitting: Vascular Surgery

## 2022-12-23 NOTE — Telephone Encounter (Signed)
Lvm to make new chf appt / w/ either provider 

## 2022-12-24 NOTE — Telephone Encounter (Signed)
This patient continues to call the office regarding her medication.  A message was taken and sent on 12/22/22.  Amy routed it to Loni Muse was helping in clinic and was unware that anything had been forwarded to her.  The patient is wanting the "remainder" of her pills, she stated she only got 10 pills.

## 2022-12-28 ENCOUNTER — Ambulatory Visit: Payer: Medicaid Other | Admitting: Orthopaedic Surgery

## 2022-12-28 ENCOUNTER — Encounter: Payer: Self-pay | Admitting: Orthopaedic Surgery

## 2022-12-28 VITALS — Ht 66.0 in | Wt 214.0 lb

## 2022-12-28 DIAGNOSIS — G8929 Other chronic pain: Secondary | ICD-10-CM | POA: Diagnosis not present

## 2022-12-28 DIAGNOSIS — M25512 Pain in left shoulder: Secondary | ICD-10-CM

## 2022-12-28 MED ORDER — HYDROCODONE-ACETAMINOPHEN 7.5-325 MG PO TABS: 1.0000 | ORAL_TABLET | Freq: Four times a day (QID) | ORAL | 0 refills | Status: AC | PRN

## 2022-12-28 MED ORDER — OXYCODONE-ACETAMINOPHEN 5-325 MG PO TABS: 1.0000 | ORAL_TABLET | Freq: Four times a day (QID) | ORAL | 0 refills | Status: AC | PRN

## 2022-12-28 NOTE — Patient Instructions (Signed)
Central Scheduling [phone]

## 2022-12-28 NOTE — Addendum Note (Signed)
Addended by: Earnstine Regal on: 12/28/2022 08:09 AM   Modules accepted: Orders

## 2022-12-28 NOTE — Progress Notes (Signed)
My left shoulder is hurting bad.  She has left shoulder pain that is not improving. She has limited motion.  She had a fall earlier in the year.  She has pain sleeping on it.  She has no numbness, no swelling, no redness.  Examination of left Upper Extremity is done.  Inspection:   Overall:  Elbow non-tender without crepitus or defects, forearm non-tender without crepitus or defects, wrist non-tender without crepitus or defects, hand non-tender.    Shoulder: with glenohumeral joint tenderness, without effusion.   Upper arm:  without swelling and tenderness   Range of motion:   Overall:  Full range of motion of the elbow, full range of motion of wrist and full range of motion in fingers.   Shoulder:  left  150 degrees forward flexion; 145 degrees abduction; 20 degrees internal rotation, 15 degrees external rotation, 5 degrees extension, 40 degrees adduction.   Stability:   Overall:  Shoulder, elbow and wrist stable   Strength and Tone:   Overall full shoulder muscles strength, full upper arm strength and normal upper arm bulk and tone.   Encounter Diagnosis  Name Primary?   Chronic pain in left shoulder Yes   I will get MRI of the left shoulder.  I have reviewed the [place] Controlled Substance Reporting System web site prior to prescribing narcotic medicine for this patient.  Return in two weeks.  Call if any problem.  Precautions discussed.  Electronically Signed Darreld Mclean, MD 8/13/20243:09 PM

## 2022-12-31 ENCOUNTER — Ambulatory Visit (HOSPITAL_COMMUNITY)
Admission: RE | Admit: 2022-12-31 | Discharge: 2022-12-31 | Disposition: A | Discharge status: Home / Self Care | Payer: Medicaid Other | Source: Ambulatory Visit | Attending: Orthopaedic Surgery | Admitting: Orthopaedic Surgery

## 2022-12-31 DIAGNOSIS — M25512 Pain in left shoulder: Secondary | ICD-10-CM | POA: Insufficient documentation

## 2022-12-31 DIAGNOSIS — G8929 Other chronic pain: Secondary | ICD-10-CM | POA: Diagnosis present

## 2023-01-05 ENCOUNTER — Telehealth: Payer: Self-pay | Admitting: Orthopaedic Surgery

## 2023-01-05 ENCOUNTER — Ambulatory Visit: Payer: Medicaid Other | Admitting: Orthopaedic Surgery

## 2023-01-05 NOTE — Telephone Encounter (Signed)
Patient called, stated her shoulder was hurting and she wanted to be seen before 8/27.  I explained that I do not have anything, but if we get a cancellation we'd call her.  She has an appointment with Dr. Hilda Lias on 8/27.

## 2023-01-11 ENCOUNTER — Encounter: Payer: Self-pay | Admitting: Orthopaedic Surgery

## 2023-01-11 ENCOUNTER — Ambulatory Visit: Payer: Medicaid Other | Admitting: Orthopaedic Surgery

## 2023-01-11 VITALS — BP 164/106 | HR 78 | Ht 66.0 in | Wt 210.0 lb

## 2023-01-11 DIAGNOSIS — M25552 Pain in left hip: Secondary | ICD-10-CM

## 2023-01-11 DIAGNOSIS — G8929 Other chronic pain: Secondary | ICD-10-CM | POA: Diagnosis not present

## 2023-01-11 DIAGNOSIS — M25512 Pain in left shoulder: Secondary | ICD-10-CM | POA: Diagnosis not present

## 2023-01-11 MED ORDER — HYDROCODONE-ACETAMINOPHEN 5-325 MG PO TABS: ORAL_TABLET | ORAL | 0 refills | Status: DC

## 2023-01-11 NOTE — Progress Notes (Signed)
My shoulder hurts.  She had the MRI of the left shoulder and it showed: IMPRESSION: 1. 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 There is up to 9 mm retraction of the torn fibers. No rotator cuff muscle atrophy. 2. Mild infraspinatus tendinosis. 3. Mild degenerative changes of the acromioclavicular joint. Mild broad-based distal lateral subacromial spurring.  I have explained the findings to her.  I will have Dr. Dallas Schimke see her.  She had MRI of the hip on the left showing: IMPRESSION: 1. Mild osteoarthritis of the left hip. 2. 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 3. Colonic diverticulosis.   I have explained the findings to her.  She has pain in the shoulder more than the hip today.  ROM of the left shoulder is decreased and painful.  NV intact. She has no effusion.  Encounter Diagnoses  Name Primary?   Chronic pain in left shoulder Yes   Chronic left hip pain    I have independently reviewed the MRI.    I will have her see Dr. Dallas Schimke.  I will refill her pain medicine.  I have reviewed the [place] Controlled Substance Reporting System web site prior to prescribing narcotic medicine for this patient.  Call if any problem.  Precautions discussed.  Electronically Signed Darreld Mclean, MD 8/27/20249:50 AM

## 2023-01-13 ENCOUNTER — Other Ambulatory Visit: Payer: Self-pay | Admitting: Orthopaedic Surgery

## 2023-01-13 ENCOUNTER — Telehealth: Payer: Self-pay

## 2023-01-13 NOTE — Telephone Encounter (Signed)
Called patient and advised her that the script has been sent to the doctor and he is out of the office

## 2023-01-13 NOTE — Telephone Encounter (Signed)
Dr. Sanjuan Dame pt - this patient presented to the office, frustrated, irritated and rude.  She stated she hates this office.  She was here on 8/27 and stated that her AVS said Hydrocodone on it and it's supposed to be Oxycodone.  She stated that she is allergic to Hydrocodone, it causes her to itch and be nauseated.  Advised everyone is at lunch, but I will send the message back and we'll go from there.  She stated that she hopes it doesn't take a week to get corrected.  [phone]

## 2023-01-14 ENCOUNTER — Ambulatory Visit: Payer: Medicaid Other | Admitting: Orthopedic Surgery

## 2023-01-14 ENCOUNTER — Encounter: Payer: Self-pay | Admitting: Orthopedic Surgery

## 2023-01-14 VITALS — BP 143/99 | HR 87 | Wt 224.0 lb

## 2023-01-14 DIAGNOSIS — M25511 Pain in right shoulder: Secondary | ICD-10-CM

## 2023-01-14 DIAGNOSIS — G8929 Other chronic pain: Secondary | ICD-10-CM

## 2023-01-14 DIAGNOSIS — M25512 Pain in left shoulder: Secondary | ICD-10-CM | POA: Diagnosis not present

## 2023-01-14 NOTE — Patient Instructions (Signed)

## 2023-01-14 NOTE — Telephone Encounter (Signed)
Will one of you call this patient and advise?  I don't think it'll be a simple call.

## 2023-01-14 NOTE — Telephone Encounter (Signed)
Patient just saw Dr Jinny Blossom and she did not pick up the Hydrocodone it makes her itch she per Dr Jinny Blossom she would like the oxycodone.

## 2023-01-14 NOTE — Progress Notes (Signed)
New Patient Visit  Assessment: Chelsey Santiago is a 47 y.o. female with the following: 1. Chronic pain of both shoulders  Plan: Chelsey Santiago has pain in both shoulders.  MRI demonstrates partial-thickness tears of the right supraspinatus, as well as partial-thickness tearing of the left supraspinatus.  She has completed therapy for the right shoulder, with minimal improvement in her symptoms.  Her left shoulder pain is currently worse than the right.  No injections.  She has not done any therapy for her left shoulder.  She may ultimately benefit from surgery, but I am reluctant to discuss this at this time.  She does have a history of congestive heart failure, although she states that she has been cleared by her cardiologist for bariatric surgery.  In addition, she has been on narcotic pain medications for at least 6 months.  I told her that patients receiving narcotics prior to surgery tended to work with pain control following surgery.  In addition, we have not exhausted nonoperative measures for either shoulder.  As such, I am recommending an injection of the left shoulder.  If this is helpful in the left shoulder, we can [place] proceed with an injection of the right shoulder.  She states understanding.  She will get her narcotic pain medication prescription straightened out with Dr. Hilda Lias.  She will follow-up as needed.   Procedure note injection Left shoulder    Verbal consent was obtained to inject the left shoulder, subacromial space Timeout was completed to confirm the site of injection.  The skin was prepped with alcohol and ethyl chloride was sprayed at the injection site.  A 21-gauge needle was used to inject 40 mg of Depo-Medrol and 1% lidocaine (4 cc) into the subacromial space of the left shoulder using a posterolateral approach.  There were no complications. A sterile bandage was applied.   Follow-up: Return if symptoms worsen or fail to improve.  Subjective:  Chief  Complaint  Patient presents with   Shoulder Pain    Bilat/Left hurts more than the right. MRI shows a 90% tear, per patient.    History of Present Illness: Chelsey Santiago is a 47 y.o. female who presents for evaluation of bilateral shoulder pain.  Left is worse than right.  She is right-hand dominant.  She is on disability secondary to congestive heart failure.  She has had pain in bilateral shoulders since a fall in February, 2024.  She slipped in a laundromat.  She states that there is some litigation pending.  She has been treated by Dr. Hilda Lias thus far.  She has done some physical therapy for her right shoulder.  She continues to have pain in her lower back, as well as the left hip.  More recently, she has been having worsening pain in the left shoulder.  MRIs have been obtained for both shoulders.  She has not had an injection in either shoulder.  She is currently taking narcotic pain medication for her pain overall.   Review of Systems: No fevers or chills No numbness or tingling No chest pain No shortness of breath No bowel or bladder dysfunction No GI distress No headaches   Medical History:  Past Medical History:  Diagnosis Date   Anemia    H&H of 10.6/33 and 07/2008 and 11.9/35 and 09/2010   Anxiety    Chronic combined systolic and diastolic CHF (congestive heart failure) (HCC)    a. EF 40-45% by echo in 12/2015 with cath showing normal cors b. EF at 45%  by repeat echo in 10/2018 c. EF at 30-35% in 03/2020   Depression with anxiety    Diabetes mellitus without complication (HCC)    Diverticulitis    09/2020   Hypertension    Hypertensive heart disease 2009   Pulmonary edema postpartum; mild to moderate mitral regurgitation when hospitalized for CHF in 2009; Echocardiogram in 12/2009-no MR and normal EF; normal CXR in 09/2010   Migraine headache    Miscarriage 03/19/2013   Obesity 04/16/2009   Osteoarthritis, knee 03/29/2011   Preeclampsia    Pulmonary edema    Sleep  apnea    Threatened abortion in early pregnancy 03/15/2013    Past Surgical History:  Procedure Laterality Date   BREAST REDUCTION SURGERY  2002   CARDIAC CATHETERIZATION N/A 12/22/2015   Procedure: Left Heart Cath and Coronary Angiography;  Surgeon: Peter M [place], MD;  Location: [facility] INVASIVE CV LAB;  Service: Cardiovascular;  Laterality: N/A;   CESAREAN SECTION N/A 04/09/2014   Procedure: CESAREAN SECTION;  Surgeon: Catalina Antigua, MD;  Location: [facility] ORS;  Service: Obstetrics;  Laterality: N/A;   CHOLECYSTECTOMY     HYSTERECTOMY ABDOMINAL WITH SALPINGECTOMY N/A 09/26/2019   Procedure: HYSTERECTOMY ABDOMINAL WITH SALPINGECTOMY;  Surgeon: Lazaro Arms, MD;  Location: [facility];  Service: Gynecology;  Laterality: N/A;   LIPOMA EXCISION Right 09/26/2019   Procedure: EXCISION LIPOMA RIGHT VULVAR;  Surgeon: Lazaro Arms, MD;  Location: [facility];  Service: Gynecology;  Laterality: Right;    Family History  Problem Relation Age of Onset   Diabetes Mother 66   Heart disease Mother    Hyperlipidemia Paternal Grandfather    Hypertension Paternal Grandfather    Heart disease Father    Hypertension Father    Heart disease Maternal Grandmother 48   ADD / ADHD Son    Hypertension Maternal Uncle    Heart attack Brother    Sudden death Neg Hx    Colon cancer Neg Hx    Celiac disease Neg Hx    Inflammatory bowel disease Neg Hx    Social History   Tobacco Use   Smoking status: Never   Smokeless tobacco: Never  Vaping Use   Vaping status: Never Used  Substance Use Topics   Alcohol use: Yes    Comment: occ   Drug use: No    Allergies  Allergen Reactions   Diclofenac Swelling    AND POSSIBLE SYNCOPE; tolerates ibuprofen per pt   Tramadol Nausea And Vomiting and Nausea Only    Itching (12/21); tolerates ibuprofen per pt   Vicodin [Hydrocodone-Acetaminophen] Itching and Nausea Only    Current Meds  Medication Sig   ACCU-CHEK GUIDE test strip USE AS DIRECTED TO TESTCBLOOD SUGAR TWICE  DAILY.   albuterol (VENTOLIN HFA) 108 (90 Base) MCG/ACT inhaler Inhale 1 puff into the lungs every 6 (six) hours as needed for wheezing or shortness of breath.   amLODipine (NORVASC) 10 MG tablet Take 1 tablet (10 mg total) by mouth daily.   aspirin EC 81 MG tablet Take 81 mg by mouth daily.   budesonide-formoterol (SYMBICORT) 80-4.5 MCG/ACT inhaler Inhale 2 puffs into the lungs 2 (two) times daily.   carvedilol (COREG) 25 MG tablet Take 1 tablet (25 mg total) by mouth 2 (two) times daily with a meal.   clonazePAM (KLONOPIN) 0.5 MG tablet Take 0.5 mg by mouth 2 (two) times daily.   cyclobenzaprine (FLEXERIL) 10 MG tablet Take 1 tablet (10 mg total) by mouth 2 (two) times daily as needed  for muscle spasms.   dapagliflozin propanediol (FARXIGA) 10 MG TABS tablet Take 1 tablet (10 mg total) by mouth daily before breakfast.   gabapentin (NEURONTIN) 800 MG tablet Take 800 mg by mouth 3 (three) times daily.   hydrALAZINE (APRESOLINE) 100 MG tablet Take 1 tablet (100 mg total) by mouth 3 (three) times daily.   ibuprofen (ADVIL) 600 MG tablet Take 1 tablet (600 mg total) by mouth every 6 (six) hours as needed.   insulin glargine (LANTUS SOLOSTAR) 100 UNIT/ML Solostar Pen Inject 40 Units into the skin at bedtime.   insulin lispro (HUMALOG) 100 UNIT/ML injection Inject 1 Units into the skin 3 (three) times daily before meals.   Ipratropium-Albuterol (COMBIVENT RESPIMAT) 20-100 MCG/ACT AERS respimat Inhale 1 puff into the lungs every 6 (six) hours as needed for wheezing or shortness of breath.   ipratropium-albuterol (DUONEB) 0.5-2.5 (3) MG/3ML SOLN Take 3 mLs by nebulization 3 (three) times daily.   isosorbide mononitrate (IMDUR) 30 MG 24 hr tablet Take 1 tablet (30 mg total) by mouth 2 (two) times daily.   ketorolac (TORADOL) 10 MG tablet Take 1 tablet (10 mg total) by mouth every 6 (six) hours as needed.   nabumetone (RELAFEN) 500 MG tablet Take 500 mg by mouth 2 (two) times daily.   nitroGLYCERIN  (NITROSTAT) 0.4 MG SL tablet Place 1 tablet (0.4 mg total) under the tongue every 5 (five) minutes as needed for chest pain.   ondansetron (ZOFRAN) 4 MG tablet Take 4 mg by mouth 4 (four) times daily.   OZEMPIC, 1 MG/DOSE, 4 MG/3ML SOPN Inject 1 mg into the skin once a week.   potassium chloride SA (KLOR-CON M) 20 MEQ tablet Take 1 tablet (20 mEq total) by mouth daily.   sacubitril-valsartan (ENTRESTO) 97-103 MG Take 1 tablet by mouth 2 (two) times daily.   spironolactone (ALDACTONE) 25 MG tablet Take 1 tablet (25 mg total) by mouth daily.   torsemide (DEMADEX) 20 MG tablet Take 2 tablets (40 mg total) by mouth 2 (two) times daily.   traZODone (DESYREL) 100 MG tablet Take 2 tablets (200 mg total) by mouth at bedtime.    Objective: BP (!) 143/99   Pulse 87   Wt 224 lb (101.6 kg)   LMP 08/21/2019   BMI 36.15 kg/m   Physical Exam:  General: Alert and oriented. and No acute distress. Gait: Normal gait.  Bilateral shoulders without deformity.  Tenderness palpation over the anterior shoulder.  Slightly restricted forward flexion of the right shoulder.  110 degrees of forward flexion on the left.  Passively, she has pain with forward flexion beyond this.  Good strength in the supraspinatus, but pain with the empty can testing position.  Pain in the posterior shoulder with O'Brien's testing.  Fingers are warm and well-perfused.  IMAGING: I personally reviewed images previously obtained in clinic  Right shoulder MRI  IMPRESSION: 1. 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 No tendon retraction or focal muscular atrophy. 2. Mild infraspinatus tendinosis without tear. 3. The labrum and biceps tendon appear intact. 4. Mild-to-moderate acromioclavicular degenerative changes.   Left shoulder MRI  IMPRESSION: 1. At least 90% high-grade partial-thickness bursal sided tear and possibly a  full-thickness tear of the anterior 12 mm of the supraspinatus tendon footprint. There is up to 9 mm retraction of the torn fibers. No rotator cuff muscle atrophy. 2. Mild infraspinatus tendinosis. 3. Mild degenerative changes of the acromioclavicular joint. Mild broad-based  distal lateral subacromial spurring.    New Medications:  No orders of the defined types were placed in this encounter.     Oliver Barre, MD  01/14/2023 10:49 AM

## 2023-01-18 ENCOUNTER — Telehealth: Payer: Self-pay

## 2023-01-18 MED ORDER — OXYCODONE-ACETAMINOPHEN 5-325 MG PO TABS: ORAL_TABLET | ORAL | 0 refills | Status: DC

## 2023-01-18 NOTE — Telephone Encounter (Signed)
Complete

## 2023-01-19 ENCOUNTER — Encounter: Payer: Medicaid Other | Admitting: Pulmonary Disease

## 2023-01-24 ENCOUNTER — Ambulatory Visit (INDEPENDENT_AMBULATORY_CARE_PROVIDER_SITE_OTHER): Payer: Medicaid Other | Admitting: Obstetrics & Gynecology

## 2023-01-24 ENCOUNTER — Encounter: Payer: Self-pay | Admitting: Obstetrics & Gynecology

## 2023-01-24 ENCOUNTER — Other Ambulatory Visit (HOSPITAL_COMMUNITY)
Admission: RE | Admit: 2023-01-24 | Discharge: 2023-01-24 | Disposition: A | Discharge status: Home / Self Care | Payer: Medicaid Other | Source: Ambulatory Visit | Attending: Obstetrics & Gynecology | Admitting: Obstetrics & Gynecology

## 2023-01-24 ENCOUNTER — Encounter: Payer: Self-pay | Admitting: *Deleted

## 2023-01-24 VITALS — BP 214/143 | HR 83 | Ht 66.0 in | Wt 221.4 lb

## 2023-01-24 DIAGNOSIS — N393 Stress incontinence (female) (male): Secondary | ICD-10-CM

## 2023-01-24 DIAGNOSIS — Z1211 Encounter for screening for malignant neoplasm of colon: Secondary | ICD-10-CM

## 2023-01-24 DIAGNOSIS — Z113 Encounter for screening for infections with a predominantly sexual mode of transmission: Secondary | ICD-10-CM | POA: Insufficient documentation

## 2023-01-24 DIAGNOSIS — Z90710 Acquired absence of both cervix and uterus: Secondary | ICD-10-CM | POA: Diagnosis not present

## 2023-01-24 DIAGNOSIS — Z01419 Encounter for gynecological examination (general) (routine) without abnormal findings: Secondary | ICD-10-CM

## 2023-01-24 DIAGNOSIS — I110 Hypertensive heart disease with heart failure: Secondary | ICD-10-CM

## 2023-01-24 NOTE — Addendum Note (Signed)
Addended by: Annamarie Dawley on: 01/24/2023 11:31 AM   Modules accepted: Orders

## 2023-01-24 NOTE — Progress Notes (Signed)
WELL-WOMAN EXAMINATION Patient name: Chelsey Santiago MRN [id number]  Date of birth: 1975/08/02 Chief Complaint:   Gynecologic Exam  History of Present Illness:   Chelsey Santiago is a 47 y.o. [id number] PM, PH female being seen today for a routine well-woman exam and the following concerns:  -Stress incontinence: Notes small leakage of urine when she coughs and sneezes.    -pelvic pain: She reports that on occasion after intercourse she will have cramping and discomfort.  Pt also desires STI screening  Denies irregular discharge, itching or irritation.  Denies pelvic/abdominal pain any other time  Patient's last menstrual period was 08/21/2019. Denies issues with her menses The current method of family planning is status post hysterectomy.    Last pap s/p hysterectomy.  Last mammogram: ordered. Last colonoscopy: []  referral to colonoscopy     04/16/2016   11:33 AM 03/26/2016   10:18 AM 03/26/2016   10:17 AM 08/06/2015    2:51 PM  Depression screen PHQ 2/9  Decreased Interest 2 3 3 3   Down, Depressed, Hopeless 2 3 3 3   PHQ - 2 Score 4 6 6 6   Altered sleeping 1 3  3   Tired, decreased energy 1 3  3   Change in appetite 2 3  2   Feeling bad or failure about yourself  2 3  3   Trouble concentrating 2 3  2   Moving slowly or fidgety/restless 1 1  0  Suicidal thoughts 2 2  2   PHQ-9 Score 15 24  21   Difficult doing work/chores    Very difficult      Review of Systems:   Pertinent items are noted in HPI Denies any headaches, blurred vision, fatigue, shortness of breath, chest pain, abdominal pain, bowel movements, urination, or intercourse unless otherwise stated above.  Pertinent History Reviewed:  Reviewed past medical,surgical, social and family history.  Reviewed problem list, medications and allergies. Physical Assessment:   Vitals:   01/24/23 0946 01/24/23 1011  BP: (!) 208/134 (!) 214/143  Pulse: 84 83  Weight: 221 lb 6.4 oz (100.4 kg)   Height: 5\' 6"  (1.676 m)    Body mass index is 35.73 kg/m.        Physical Examination:   General appearance - well appearing, and in no distress  Mental status - alert, oriented to person, place, and time  Psych:  She has a normal mood and affect  Skin - warm and dry, normal color, no suspicious lesions noted  Chest - effort normal, all lung fields clear to auscultation bilaterally  Heart - normal rate and regular rhythm  Neck:  midline trachea, no thyromegaly or nodules  Breasts - breasts appear normal, no suspicious masses, no skin or nipple changes or  axillary nodes  Abdomen - obese, soft, nontender, nondistended, no masses or organomegaly  Pelvic - VULVA: normal appearing vulva with no masses, tenderness or lesions  VAGINA: normal appearing vagina with normal color, white discharge noted.  Vaginal cuff intact- uterus and cervix surgically absent ADNEXA: No adnexal masses or tenderness noted.  No urethral hypermobility, no prolapse appreciated.   Extremities:  No swelling or varicosities noted.  No calf tenderness bilaterally  Chaperone: Faith Rogue     Assessment & Plan:  1) Well-Woman Exam -pap no longer indicated -mammogram placed, encouraged pt to completed -colonoscopy referral created  2) Hypertension -pt notes this is an ongoing issue.  She also reports she did not take her medication last night and just took her am meds before arrival -currently  asymptomatic -pt closely followed by PCP  3) STI screening -desires full screening, lab work ordered  4) Stress incontinence -reviewed conservative measures -no abnormalities noted on exam -reviewed pelvic floor/strength exercises -pt has gastric surgery scheduled for Oct- suspect that with weight loss she may likely note improvement of her symptoms   Orders Placed This Encounter  Procedures   HIV Antibody (routine testing w rflx)   RPR   Ambulatory referral to Gastroenterology    Follow-up: Return in about 2 years (around 01/23/2025) for  Annual.   Myna Hidalgo, DO Attending Obstetrician & Gynecologist, [facility], [facility]

## 2023-01-25 ENCOUNTER — Other Ambulatory Visit: Payer: Self-pay | Admitting: Obstetrics & Gynecology

## 2023-01-25 DIAGNOSIS — N760 Acute vaginitis: Secondary | ICD-10-CM

## 2023-01-25 LAB — CERVICOVAGINAL ANCILLARY ONLY
Bacterial Vaginitis (gardnerella): POSITIVE — AB
Candida Glabrata: NEGATIVE
Candida Vaginitis: POSITIVE — AB
Chlamydia: NEGATIVE
Comment: NEGATIVE
Comment: NEGATIVE
Comment: NEGATIVE
Comment: NEGATIVE
Comment: NEGATIVE
Comment: NORMAL
Neisseria Gonorrhea: NEGATIVE
Trichomonas: POSITIVE — AB

## 2023-01-25 LAB — HIV ANTIBODY (ROUTINE TESTING W REFLEX): HIV Screen 4th Generation wRfx: NONREACTIVE

## 2023-01-25 LAB — RPR: RPR Ser Ql: NONREACTIVE

## 2023-01-25 MED ORDER — METRONIDAZOLE 500 MG PO TABS
500.0000 mg | ORAL_TABLET | Freq: Two times a day (BID) | ORAL | 1 refills | Status: AC
Start: 2023-01-25 — End: 2023-02-01

## 2023-01-25 MED ORDER — TERCONAZOLE 0.8 % VA CREA: 1.0000 | TOPICAL_CREAM | Freq: Every day | VAGINAL | 0 refills | Status: DC

## 2023-01-25 NOTE — Progress Notes (Signed)
Treatment for BV, yeast and Trich sent in  Myna Hidalgo, DO Attending Obstetrician & Gynecologist, [facility] [employer], [facility]

## 2023-01-27 ENCOUNTER — Telehealth (HOSPITAL_COMMUNITY): Payer: Self-pay | Admitting: Cardiology

## 2023-01-28 ENCOUNTER — Encounter (HOSPITAL_COMMUNITY): Payer: Medicaid Other | Admitting: Cardiology

## 2023-02-02 ENCOUNTER — Encounter (HOSPITAL_COMMUNITY): Payer: Medicaid Other | Admitting: Cardiology

## 2023-02-08 ENCOUNTER — Telehealth: Payer: Self-pay | Admitting: Orthopaedic Surgery

## 2023-02-08 MED ORDER — OXYCODONE-ACETAMINOPHEN 5-325 MG PO TABS: ORAL_TABLET | ORAL | 0 refills | Status: DC

## 2023-02-08 NOTE — Telephone Encounter (Signed)
Dr. Sanjuan Santiago pt - pt lvm requesting a refill on her Oxycodone to be sent to [facility].  She is requesting a call back when it has been sent in. [phone]

## 2023-03-17 ENCOUNTER — Telehealth: Payer: Self-pay | Admitting: Physical Medicine and Rehabilitation

## 2023-03-17 NOTE — Telephone Encounter (Signed)
Patient called at make an appointment for back injection. QM#[phone]

## 2023-04-01 ENCOUNTER — Encounter (HOSPITAL_COMMUNITY): Payer: Medicaid Other | Admitting: Cardiology

## 2023-04-25 ENCOUNTER — Telehealth: Payer: Self-pay | Admitting: Physical Medicine and Rehabilitation

## 2023-04-25 NOTE — Telephone Encounter (Signed)
Patient called and said she needs an appointment for back injections. YN#[phone]

## 2023-04-29 ENCOUNTER — Ambulatory Visit (HOSPITAL_COMMUNITY)
Admission: RE | Admit: 2023-04-29 | Discharge: 2023-04-29 | Disposition: A | Discharge status: Home / Self Care | Payer: Medicaid Other | Source: Ambulatory Visit | Attending: Cardiology | Admitting: Cardiology

## 2023-04-29 VITALS — BP 168/92 | HR 71 | Wt 204.2 lb

## 2023-04-29 DIAGNOSIS — I350 Nonrheumatic aortic (valve) stenosis: Secondary | ICD-10-CM | POA: Diagnosis not present

## 2023-04-29 DIAGNOSIS — E119 Type 2 diabetes mellitus without complications: Secondary | ICD-10-CM | POA: Diagnosis not present

## 2023-04-29 DIAGNOSIS — Z8249 Family history of ischemic heart disease and other diseases of the circulatory system: Secondary | ICD-10-CM | POA: Diagnosis not present

## 2023-04-29 DIAGNOSIS — I110 Hypertensive heart disease with heart failure: Secondary | ICD-10-CM | POA: Diagnosis not present

## 2023-04-29 DIAGNOSIS — I428 Other cardiomyopathies: Secondary | ICD-10-CM | POA: Insufficient documentation

## 2023-04-29 DIAGNOSIS — I5022 Chronic systolic (congestive) heart failure: Secondary | ICD-10-CM | POA: Insufficient documentation

## 2023-04-29 DIAGNOSIS — E669 Obesity, unspecified: Secondary | ICD-10-CM | POA: Diagnosis not present

## 2023-04-29 DIAGNOSIS — Z79899 Other long term (current) drug therapy: Secondary | ICD-10-CM | POA: Insufficient documentation

## 2023-04-29 DIAGNOSIS — Z6832 Body mass index (BMI) 32.0-32.9, adult: Secondary | ICD-10-CM | POA: Insufficient documentation

## 2023-04-29 DIAGNOSIS — G4733 Obstructive sleep apnea (adult) (pediatric): Secondary | ICD-10-CM | POA: Insufficient documentation

## 2023-04-29 DIAGNOSIS — Z7984 Long term (current) use of oral hypoglycemic drugs: Secondary | ICD-10-CM | POA: Insufficient documentation

## 2023-04-29 LAB — BASIC METABOLIC PANEL
Anion gap: 9 (ref 5–15)
BUN: 20 mg/dL (ref 6–20)
CO2: 30 mmol/L (ref 22–32)
Calcium: 8.8 mg/dL — ABNORMAL LOW (ref 8.9–10.3)
Chloride: 101 mmol/L (ref 98–111)
Creatinine, Ser: 1.29 mg/dL — ABNORMAL HIGH (ref 0.44–1.00)
GFR, Estimated: 52 mL/min — ABNORMAL LOW (ref >60)
Glucose, Bld: 168 mg/dL — ABNORMAL HIGH (ref 70–99)
Potassium: 3.4 mmol/L — ABNORMAL LOW (ref 3.5–5.1)
Sodium: 140 mmol/L (ref 135–145)

## 2023-04-29 LAB — HEMOGLOBIN A1C
Hgb A1c MFr Bld: 6.6 % — ABNORMAL HIGH (ref 4.8–5.6)
Mean Plasma Glucose: 142.72 mg/dL

## 2023-04-29 LAB — BRAIN NATRIURETIC PEPTIDE: B Natriuretic Peptide: 4361.9 pg/mL — ABNORMAL HIGH (ref 0.0–100.0)

## 2023-04-29 MED ORDER — DAPAGLIFLOZIN PROPANEDIOL 10 MG PO TABS: 10.0000 mg | ORAL_TABLET | Freq: Every day | ORAL | 6 refills | Status: DC

## 2023-04-29 MED ORDER — POTASSIUM CHLORIDE CRYS ER 20 MEQ PO TBCR
20.0000 meq | EXTENDED_RELEASE_TABLET | Freq: Every day | ORAL | 3 refills | Status: DC
Start: 2023-04-29 — End: 2024-01-31

## 2023-04-29 MED ORDER — TORSEMIDE 20 MG PO TABS: 40.0000 mg | ORAL_TABLET | Freq: Every day | ORAL | 3 refills | Status: DC

## 2023-04-29 MED ORDER — SPIRONOLACTONE 25 MG PO TABS: 25.0000 mg | ORAL_TABLET | Freq: Every day | ORAL | 3 refills | Status: DC

## 2023-04-29 MED ORDER — ISOSORBIDE MONONITRATE ER 60 MG PO TB24: 60.0000 mg | ORAL_TABLET | Freq: Every day | ORAL | 3 refills | Status: DC

## 2023-04-29 NOTE — Patient Instructions (Signed)
Medication Changes:  START Farxiga 10 mg Daily  START Spironolactone 25 mg Daily  START Imdur 60 mg Daily  START Potassium 20 meq Daily  INCREASE Torsemide to 40 mg (2 tabs) Daily  Lab Work:  Labs done today, your results will be available in MyChart, we will contact you for abnormal readings.  Your physician recommends that you return for lab work in: 1-2 weeks, order has been placed for this to be done at [facility] in [place]  Special Instructions // Education:  Do the following things EVERYDAY: Weigh yourself in the morning before breakfast. Write it down and keep it in a log. Take your medicines as prescribed Eat low salt foods--Limit salt (sodium) to 2000 mg per day.  Stay as active as you can everyday Limit all fluids for the day to less than 2 liters   Follow-Up in: 3 weeks   At the [facility], you and your health needs are our priority. We have a designated team specialized in the treatment of Heart Failure. This Care Team includes your primary Heart Failure Specialized Cardiologist (physician), Advanced Practice Providers (APPs- Physician Assistants and Nurse Practitioners), and Pharmacist who all work together to provide you with the care you need, when you need it.   You may see any of the following providers on your designated Care Team at your next follow up:  Dr. Arvilla Meres Dr. Marca Ancona Dr. Dorthula Nettles Dr. Theresia Bough Tonye Becket, NP Robbie Lis, [facility], [place] Brynda Peon, NP [place] Lee, NP Karle Plumber, PharmD   Please be sure to bring in all your medications bottles to every appointment.   Need to Contact us:  If you have any questions or concerns before your next appointment please send [place] a message through [place] or call our office at [phone].    TO LEAVE A MESSAGE FOR THE NURSE SELECT OPTION 2, PLEASE LEAVE A MESSAGE INCLUDING: YOUR NAME DATE OF BIRTH CALL BACK  NUMBER REASON FOR CALL**this is important as we prioritize the call backs  YOU WILL RECEIVE A CALL BACK THE SAME DAY AS LONG AS YOU CALL BEFORE 4:00 PM

## 2023-05-01 NOTE — Progress Notes (Signed)
PCP: Golden Pop, FNP Cardiology: Dr. Wyline Mood HF Cardiology: Dr. Shirlee Latch  47 y.o. with history of hypertension and nonischemic cardiomyopathy was referred by Dr. Wyline Mood for evaluation of CHF.  Patient has a long history of poorly controlled HTN.  She also has a history since at least 2015 of cardiomyopathy.  Echo in 11/15 showed EF 25%, LHC in 8/17 showed no coronary disease, cardiac MRI in 11/17 showed EF 25% with no delayed enhancement.  Most recent echo in 7/24 showed EF 30-35%, global hypokinesis, normal RV, mild aortic stenosis with mean gradient 10 mmHg, IVC normal.  She has mild aortic stenosis at a young age but the aortic valve is trileaflet. She does not drink, smoke, or use drugs.  She does have a strong family history of cardiac disease, parents with CHF and a brother and nephew died early from cardiac disease ("heart attack" per her report).  She has had morbid obesity with OHS/OSA.  Currently using CPAP at night but not oxygen during the day.  She had a sleep gastrectomy in 10/24 and has lost about 26 lbs so far.   Multiple medications were stopped after her gastrectomy.  She started back on torsemide 20 mg daily on her own.  BP now running high, SBP in 180s when she checks at home.  She has a nocturnal cough.  Legs have been swelling.  She is short of breath walking < 100 feet.  She has orthopnea and bendopnea.    ECG (personally reviewed): NSR, LVH with repolarization abnormality  Labs (11/24): K 3.6, creatinine 1.03  PMH: 1. HTN: Renal artery dopplers showed no renal artery stenosis. 2. OHS/OSA 3. Obesity: S/p sleeve gastrectomy in 10/24. 4. Chronic systolic CHF: Nonischemic cardiomyopathy.  - Echo (11/15): EF 35% - LHC (8/17): No CAD - Cardiac MRI (11/17): LV EF 35%, moderate LVH, mild RV dilation/mildly decreased RV systolic function, no LGE.  - Echo (11/21): EF 30-35% - Echo (11/22): EF 30-35% - Echo (7/24): EF 30-35%, global hypokinesis, normal RV, mild aortic stenosis  with mean gradient 10 mmHg, IVC normal.  5. Aortic stenosis: Mild on 7/24 echo, tricuspid.   SH: Lives in [place], no ETOH or smoking.  No drugs. 6 kids.   FH: Mother and grandmother died of CHF in 48s/60s, brother died with "MI."  Nephew died at 72 with "MI."   ROS: All systems reviewed and negative except as per HPI.   Current Outpatient Medications  Medication Sig Dispense Refill   ACCU-CHEK GUIDE test strip USE AS DIRECTED TO TESTCBLOOD SUGAR TWICE DAILY.     albuterol (VENTOLIN HFA) 108 (90 Base) MCG/ACT inhaler Inhale 1 puff into the lungs every 6 (six) hours as needed for wheezing or shortness of breath. 18 g 0   aspirin EC 81 MG tablet Take 81 mg by mouth daily.     budesonide-formoterol (SYMBICORT) 80-4.5 MCG/ACT inhaler Inhale 2 puffs into the lungs 2 (two) times daily. 10.2 g 0   carvedilol (COREG) 25 MG tablet Take 1 tablet (25 mg total) by mouth 2 (two) times daily with a meal. 180 tablet 2   clonazePAM (KLONOPIN) 0.5 MG tablet Take 0.5 mg by mouth 2 (two) times daily.     cyclobenzaprine (FLEXERIL) 10 MG tablet Take 1 tablet (10 mg total) by mouth 2 (two) times daily as needed for muscle spasms. 20 tablet 0   dapagliflozin propanediol (FARXIGA) 10 MG TABS tablet Take 1 tablet (10 mg total) by mouth daily before breakfast. 30 tablet 6  hydrALAZINE (APRESOLINE) 100 MG tablet Take 1 tablet (100 mg total) by mouth 3 (three) times daily. 270 tablet 2   ibuprofen (ADVIL) 600 MG tablet Take 1 tablet (600 mg total) by mouth every 6 (six) hours as needed. 100 tablet 5   insulin glargine (LANTUS SOLOSTAR) 100 UNIT/ML Solostar Pen Inject 40 Units into the skin at bedtime. 15 mL 2   insulin lispro (HUMALOG) 100 UNIT/ML injection Inject 1 Units into the skin 3 (three) times daily before meals.     isosorbide mononitrate (IMDUR) 60 MG 24 hr tablet Take 1 tablet (60 mg total) by mouth daily. 90 tablet 3   nitroGLYCERIN (NITROSTAT) 0.4 MG SL tablet Place 1 tablet (0.4 mg total) under the  tongue every 5 (five) minutes as needed for chest pain. 25 tablet 3   ondansetron (ZOFRAN) 4 MG tablet Take 4 mg by mouth 4 (four) times daily.     potassium chloride SA (KLOR-CON M) 20 MEQ tablet Take 1 tablet (20 mEq total) by mouth daily. 90 tablet 3   sacubitril-valsartan (ENTRESTO) 97-103 MG Take 1 tablet by mouth 2 (two) times daily. 180 tablet 2   spironolactone (ALDACTONE) 25 MG tablet Take 1 tablet (25 mg total) by mouth daily. 90 tablet 3   traZODone (DESYREL) 100 MG tablet Take 2 tablets (200 mg total) by mouth at bedtime.     Ipratropium-Albuterol (COMBIVENT RESPIMAT) 20-100 MCG/ACT AERS respimat Inhale 1 puff into the lungs every 6 (six) hours as needed for wheezing or shortness of breath. (Patient not taking: Reported on 04/29/2023) 1 Inhaler 0   ipratropium-albuterol (DUONEB) 0.5-2.5 (3) MG/3ML SOLN Take 3 mLs by nebulization 3 (three) times daily. (Patient not taking: Reported on 04/29/2023) 360 mL 1   OZEMPIC, 1 MG/DOSE, 4 MG/3ML SOPN Inject 1 mg into the skin once a week. (Patient not taking: Reported on 04/29/2023)     torsemide (DEMADEX) 20 MG tablet Take 2 tablets (40 mg total) by mouth daily. 60 tablet 3   No current facility-administered medications for this encounter.   BP (!) 168/92   Pulse 71   Wt 92.6 kg (204 lb 4 oz)   LMP 08/21/2019   SpO2 97%   BMI 32.97 kg/m  General: NAD Neck: JVP 8-9 cm with HJR, no thyromegaly or thyroid nodule.  Lungs: Clear to auscultation bilaterally with normal respiratory effort. CV: Nondisplaced PMI.  Heart regular S1/S2, no S3/S4, 2/6 SEM RUSB  1+ ankle edema.  No carotid bruit.  Normal pedal pulses.  Abdomen: Soft, nontender, no hepatosplenomegaly, no distention.  Skin: Intact without lesions or rashes.  Neurologic: Alert and oriented x 3.  Psych: Normal affect. Extremities: No clubbing or cyanosis.  HEENT: Normal.   Assessment/Plan: 1.  Chronic systolic CHF: Nonischemic cardiomyopathy.  Cath in 8/17 with no significant CAD.   Cardiac MRI in 11/17 showed LV EF 35%, moderate LVH, mild RV dilation/mildly decreased RV systolic function, no LGE.  Most recent echo in 7/24 showed EF 30-35%, global hypokinesis, normal RV, mild aortic stenosis with mean gradient 10 mmHg, IVC normal.   She does not use ETOH or drugs.  She does have a family history of CHF.  She has had years of uncontrolled HTN.  Cause of cardiomyopathy likely HTN versus familial.  She is volume overloaded on exam with NYHA class III symptoms and uncontrolled HTN.  - Increase torsemide to 40 mg daily and add KCl 20 daily. BMET/BNP today, BMET in 10 days.  - Continue Coreg 25 mg bid.  -  Continue Entresto 97/103 bid - Continue hydralazine 100 tid and restart Imdur 60 mg daily (took in past, stopped after gastrectomy).  - Restart spironolactone 25 mg daily (took in past, stopped after gastrectomy).  - Start Farxiga 10 mg daily.  - I will arrange for genetic testing for hereditary cardiomyopathies.  - Will repeat echo on maximal GDMT with BP controlled, and if EF < 35%, she will need ICD.  Narrow QRS so not CRT candidate.  2. HTN: Uncontrolled.  Renal artery dopplers in 9/23 showed no renal artery stenosis.  - Medication changes as noted above.  3. Aortic stenosis: Mild on echo in 7/24.  She is young for AS but valve appears trileaflet.  4. Obesity: Losing weight post-sleeve gastrectomy.   Followup 3 wks with APP.   Marca Ancona 05/01/2023

## 2023-05-13 ENCOUNTER — Ambulatory Visit: Payer: Medicaid Other | Admitting: Nurse Practitioner

## 2023-05-13 NOTE — Progress Notes (Deleted)
Cardiology Office Note:  .   Date:  05/13/2023  ID:  Chelsey Santiago, DOB 02-May-1976, MRN [id number] PCP: Chelsey Pop, FNP  [facility] Providers Cardiologist:  Chelsey Rich, MD    History of Present Illness: .   Chelsey Santiago is a 47 y.o. female with a PMH of HTN, chronic systolic CHF, OSA, orthostatic hypotension, mild aortic valve stenosis, and occasional leg edema, who presents today for pre-operative cardiovascular clearance.   Normal cath in 2017. cMRI in 2017 no myocardial LGE. Echo 03/2021 showed EF 30-35%, grade 2 DD, felt to be hypertensive CM.   Last seen by Dr. Dina Santiago on October 30, 2021. He noted BP was difficult to control d/t untreated OSA. No RAS on renal artery duplex.   Today she presents for pre-operative cardiovascular risk assessment. She states she is doing well from a cardiac perspective. Wants to lose weight to do more activities with her children. Denies any chest pain, shortness of breath, palpitations, syncope, presyncope, dizziness, orthopnea, PND, swelling or significant weight changes, acute bleeding, or claudication. Has upcoming visit regarding her OSA next month.   SH: Has six children   Studies Reviewed: Chelsey Santiago Kitchen       EKG ordered today and reveals: Normal sinus rhythm with incomplete left bundle branch block, 82 bpm, nonspecific T wave abnormality, no acute ischemic changes.  Echo 03/2021: 1. Left ventricular ejection fraction, by estimation, is 30 to 35%. The  left ventricle has moderately decreased function. The left ventricle  demonstrates global hypokinesis. There is moderate left ventricular  hypertrophy. Left ventricular diastolic  parameters are consistent with Grade II diastolic dysfunction  (pseudonormalization). Elevated left atrial pressure.   2. Right ventricular systolic function is normal. The right ventricular  size is mildly enlarged.   3. Left atrial size was mildly dilated.   4. Right atrial size was mildly  dilated.   5. The mitral valve is normal in structure. No evidence of mitral valve  regurgitation. No evidence of mitral stenosis.   6. The aortic valve has an indeterminant number of cusps. Aortic valve  regurgitation is not visualized. Mild aortic valve stenosis.   7. The inferior vena cava is normal in size with greater than 50%  respiratory variability, suggesting right atrial pressure of 3 mmHg.  Risk Assessment/Calculations:    No BP recorded.  {Refresh Note OR Click here to enter BP  :1}***       Physical Exam:   VS:  LMP 08/21/2019    Wt Readings from Last 3 Encounters:  04/29/23 204 lb 4 oz (92.6 kg)  01/24/23 221 lb 6.4 oz (100.4 kg)  01/14/23 224 lb (101.6 kg)    GEN: Obese, 47 y.o. female in no acute distress NECK: No JVD; No carotid bruits CARDIAC: S1/S2, RRR, no murmurs, rubs, gallops RESPIRATORY:  Clear to auscultation without rales, wheezing or rhonchi  ABDOMEN: Soft, non-tender, non-distended EXTREMITIES:  No edema; No deformity   ASSESSMENT AND PLAN: .   Pre-operative cardiovascular risk assessment [name]'s perioperative risk of a major cardiac event is  % according to the Revised Cardiac Risk Index (RCRI).  Therefore, she is at high risk for perioperative complications.   Her functional capacity is good at   METs according to the Duke Activity Status Index (DASI). Recommendations: According to ACC/AHA guidelines, no further cardiovascular testing needed.  The patient may proceed to surgery at acceptable risk.   Antiplatelet and/or Anticoagulation Recommendations: For low risk bleeding procedures, we prefer Aspirin to be  continued prior to surgery.  If patient is felt to be at bleeding risk, okay to hold aspirin 7 days prior to procedure and resume when felt safe to do so. Discussed high risk to patient and she verbalized understanding and is agreeable to proceed. Will route note to requesting party.  2. 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  Compliant  with her medications.  No RAS seen on renal artery duplex.  Continue amlodipine, carvedilol, hydralazine, Entresto, spironolactone, and will increase Imdur to 30 mg BID. Discussed to monitor BP at home at least 2 hours after medications and sitting for 5-10 minutes. Heart healthy diet and regular cardiovascular exercise encouraged. Losing weight will also help her BP.   3. Chronic systolic CHF Stage C, NYHA class I-II symptoms. 03/2021 Echo revealed EF 30 to 35%. Euvolemic and well compensated on exam.  Continue current medication regimen.  Increasing Imdur to 30 mg twice daily. Low sodium diet, fluid restriction <2L, and daily weights encouraged. Educated to contact our office for weight gain of 2 lbs overnight or 5 lbs in one week.   4. Mild aortic valve stenosis TTE 03/2021 revealed mild aortic valve stenosis, will update Echo at this time, however Echo does not need to be performed prior to upcoming procedure.    5. OSA Says she just completed home sleep study, following Pulmonology. Follow-up with Dr. Craige Cotta next month.   Dispo: Will refill her cardiac medications per her request. Follow-up with me or APP in 6 months or sooner if anything changes.   Signed, Sharlene Dory, NP

## 2023-05-13 NOTE — Telephone Encounter (Signed)
I called, left voicemail requesting return call.

## 2023-05-26 ENCOUNTER — Telehealth (HOSPITAL_COMMUNITY): Payer: Self-pay

## 2023-05-26 NOTE — Telephone Encounter (Signed)
 Called and left patient a voice message to confirm/remind patient of their appointment [facility] on 05/27/[facility].   And to bring in all medications and/or complete list.

## 2023-05-27 ENCOUNTER — Encounter (HOSPITAL_COMMUNITY): Payer: Medicaid Other

## 2023-06-08 ENCOUNTER — Telehealth: Payer: Self-pay | Admitting: Radiology

## 2023-06-08 NOTE — Telephone Encounter (Signed)
Patient called here to [facility] office and asked if we could have [place] staff call her to schedule an appt with Dr Alvester Morin.  She missed your earlier call, and she has a flare of arthritis in her back and wants an injection.  Please call her.

## 2023-07-05 ENCOUNTER — Other Ambulatory Visit: Payer: Self-pay | Admitting: Orthopaedic Surgery

## 2023-07-05 ENCOUNTER — Telehealth: Payer: Self-pay | Admitting: Orthopaedic Surgery

## 2023-07-05 NOTE — Telephone Encounter (Signed)
Dr. Sanjuan Dame pt - spoke w/the pt, she stated she is having so much pain in her back and hip.  She is requesting a refill on Oxycodone 5-325 to be sent to [facility].

## 2023-07-06 NOTE — Telephone Encounter (Signed)
LVM letting patient know she has not been seen in 3 months and to call the office and schedule an appointment.  Dr Hilda Lias can not call in any prescriptions at this time

## 2023-08-24 NOTE — Progress Notes (Signed)
 No chief complaint on file.   Date of Surgery: 02/21/2023 Type of Surgery: Sleeve Initial Weight: 230 Last Weight: 203 Today's Weight: 180 Weight Loss Since Last Visit: 23 Total Weight Loss to Date: 26  Patient Active Problem List   Diagnosis Date Noted  . Obesity 02/21/2023  . Anemia   . Coronary artery disease   . Hiatal hernia   . Obesity, Class II, BMI 35-39.9 10/22/2021  . Essential hypertension 12/19/2019  . Gastroesophageal reflux disease 03/14/2019  . Hyperglycemia due to diabetes mellitus (HCC) 06/02/2018  . History of acute myocardial infarction 10/05/2016  . OSA on CPAP 10/05/2016  . Depression 04/16/2016  . Acute on chronic combined systolic and diastolic heart failure (HCC) 04/03/2016  . Cardiomyopathy due to hypertension (HCC) 12/22/2015  . Elderly multigravida with antepartum condition or complication 03/02/2012  . Benign essential hypertension antepartum 03/02/2012  . Osteoarthritis 03/29/2011    Past Medical History:  Diagnosis Date  . Anemia   . Anxiety and depression   . Coronary artery disease 05/18/2007   Enlarged heart  . Hiatal hernia   . Hypertension     Past Surgical History:  Procedure Laterality Date  . CESAREAN SECTION    . CHOLECYSTECTOMY    . REDUCTION MAMMAPLASTY  2002   Procedure: REDUCTION MAMMAPLASTY  . SLEEVE GASTROPLASTY N/A 02/21/2023   GASTRECTOMY SLEEVE LAPAROSCOPIC performed by Wilfred Winfred Raddle., MD at [facility] OR    Allergies  Allergen Reactions  . Hydrocodone -Acetaminophen  Itching and GI Intolerance  . Diclofenac  Other (See Comments) and Swelling    AND POSSIBLE SYNCOPE; tolerates ibuprofen  per pt, AND POSSIBLE SYNCOPE; tolerates ibuprofen  per pt, Edema  . Tramadol  GI Intolerance and Other (See Comments)    Itching (12/21); tolerates ibuprofen  per pt   Current Outpatient Medications  Medication Sig Dispense Refill  . aspirin  81 mg EC tablet Take 81 mg by mouth Once Daily.    . blood-glucose meter,continuous (Dexcom  G6 Receiver) misc Use as directed for continuous glucose monitoring. 1 each 0  . blood-glucose sensor (Dexcom G6 Sensor) devi Inject 1 sensor to the skin every 10 days for continuous glucose monitoring. 3 each 11  . blood-glucose transmitter (Dexcom G6 Transmitter) devi Use as directed for continuous glucose monitoring. Reuse transmitter for 90 days then discard and replace. 1 each 3  . carvediloL  (COREG ) 25 mg tablet Take 1 tablet by mouth 2 (two) times a day.    . Entresto  97-103 mg per tablet     . gabapentin  (NEURONTIN ) 800 mg tablet Take 1 tablet by mouth 3 (three) times a day.    . hydrALAZINE  (APRESOLINE ) 50 mg tablet Take 1 tablet (50 mg total) by mouth 3 (three) times a day. 270 tablet 0  . ipratropium-albuteroL  (DUO-NEB) 0.5-2.5 mg/3 mL nebulizer solution Take 3 mL by nebulization every 6 (six) hours as needed for wheezing or shortness of breath.    . isosorbide  mononitrate (IMDUR ) 30 mg 24 hr tablet Take 1 tablet (30 mg total) by mouth 2 (two) times a day. 180 tablet 0  . omeprazole  (PriLOSEC) 20 mg DR capsule Take 1 capsule (20 mg total) by mouth in the morning. 90 capsule 0  . potassium chloride  (KLOR-CON ) 20 mEq ER tablet Take 20 mEq by mouth Once Daily.    . semaglutide (Ozempic) 0.25 mg or 0.5 mg (2 mg/3 mL) pen injector Inject 0.25 mg under the skin once a week. (Patient taking differently: Inject 1 mg under the skin once a week.) 3 mL 1  .  spironolactone  (ALDACTONE ) 25 mg tablet Take 1 tablet by mouth daily.    . torsemide  (DEMADEX ) 20 mg tablet Take 20 mg by mouth daily as needed.     No current facility-administered medications for this visit.    Social History   Socioeconomic History  . Marital status: Single    Spouse name: Not on file  . Number of children: Not on file  . Years of education: Not on file  . Highest education level: Not on file  Occupational History  . Not on file  Tobacco Use  . Smoking status: Never  . Smokeless tobacco: Never  Substance and  Sexual Activity  . Alcohol use: No  . Drug use: No  . Sexual activity: Not on file  Other Topics Concern  . Not on file  Social History Narrative  . Not on file   Social Drivers of Health   Food Insecurity: Low Risk  (02/21/2023)   Food vital sign   . Within the past 12 months, you worried that your food would run out before you got money to buy more: Never true   . Within the past 12 months, the food you bought just didn't last and you didn't have money to get more: Never true  Transportation Needs: No Transportation Needs (02/21/2023)   Transportation   . In the past 12 months, has lack of reliable transportation kept you from medical appointments, meetings, work or from getting things needed for daily living? : No  Recent Concern: Transportation Needs - Unmet Transportation Needs (12/24/2022)   Transportation   . In the past 12 months, has lack of reliable transportation kept you from medical appointments, meetings, work or from getting things needed for daily living? : Yes  Safety: Low Risk  (02/21/2023)   Safety   . How often does anyone, including family and friends, physically hurt you?: Never   . How often does anyone, including family and friends, insult or talk down to you?: Never   . How often does anyone, including family and friends, threaten you with harm?: Never   . How often does anyone, including family and friends, scream or curse at you?: Never  Living Situation: Low Risk  (02/21/2023)   Living Situation   . What is your living situation today?: I have a steady place to live   . Think about the place you live. Do you have problems with any of the following? Choose all that apply:: None/None on this list     Family History  Problem Relation Name Age of Onset  . Diabetes Mother    . Heart disease Mother    . Breast cancer Neg Hx    . Cancer Neg Hx    . Colon cancer Neg Hx    . Stroke Neg Hx    . Hypertension Neg Hx    . Preterm labor Neg Hx    . Ovarian cancer  Neg Hx    . Miscarriages / Stillbirths Neg Hx    . Eclampsia Neg Hx      Subjective:    Overall, patient continues to do well and remains compliant with her weight loss program.  She still has a lot of issues with neuropathy and foot pain.  She saw [facility] for pain management.  She does have abdominal pain and nausea with some foods.  She is on the lowest dose of Ozempic and feels like she has more energy on this dose.  She has some GERD  when she lays down right after eating.  She is not currently taking Prilosec.  She has been following up with her cardiologist as regularly scheduled.    GI review:  NEGATIVE GI REVIEW   In addition, she GSR BARIATRIC NEG DUMPING.  Activity:  The patient has been exercising doing some walking. We discussed short and long term importance of regular exercise to maximize the metabolic benefits of the operation and lifestyle changes, as well as to prevent or minimize any muscle wasting. In particular, we talked about at least twice a week performing a typically cardio oriented exercise program, while 3 days a week performing resistance training. We also discussed variations on above suggested schedule   Liquid Intake:  Patient IS meeting protein and fluid needs.  Dietary Recall:  Breakfast: Protein shake and oatmeal, cinnamon raisin toast Lunch: tuna and a couple of crackers Dinner: chicken baked, string beans, corn Drinks water throughout the day and flavorings like MIO  We discussed at length acheiving daily protein target. Also discussed examples of typically successful patients plan for protein intake each day, and long term and short term advantages of hitting protein target. Patient expressed understanding of proper protein intake and willingness to conform  We discussed her long term weight loss goals and the necessary components of hitting protein target every single day, regular weight training exercise, and taking nutritional  supplements and their key roles in achieving sustainable success.  We have seen changes in her long term medical problems   Snacking: peanuts  Dining Out (times/week):rarely Eat protein 1st: Yes Drinks with meals: No   Vitamin Regimen: Multivitamin: Yes Calcium  Citrate: No  Vitamin B12: No Iron (18mg  or 29/30mg ): No  Hair Loss:  Yes, lots of hair loss   ROS: Patient denies shortness of breath, chest pain, hematuria, hemoptysis, dysuria, nausea/vomiting, headaches, weakness, numbness.  All others reviewed were negative except as noted above.   OBJECTIVE: GENERAL:Well appearing, well nourished, alert and oriented x 3 HEENT:Normocephalic, atraumatic, mid line trachea, no lymphadenopathy NEURO:Motor and sensation grossly intact. EXTREMITIES:Full ROM, no edema noted. SKIN:warm and dry PSYCH:Mood and orientation normal  Assessment: Patient Active Problem List   Diagnosis Date Noted  . Obesity 02/21/2023  . Anemia   . Coronary artery disease   . Hiatal hernia   . Obesity, Class II, BMI 35-39.9 10/22/2021  . Essential hypertension 12/19/2019  . Gastroesophageal reflux disease 03/14/2019  . Hyperglycemia due to diabetes mellitus (HCC) 06/02/2018  . History of acute myocardial infarction 10/05/2016  . OSA on CPAP 10/05/2016  . Depression 04/16/2016  . Acute on chronic combined systolic and diastolic heart failure (HCC) 04/03/2016  . Cardiomyopathy due to hypertension (HCC) 12/22/2015  . Elderly multigravida with antepartum condition or complication 03/02/2012  . Benign essential hypertension antepartum 03/02/2012  . Osteoarthritis 03/29/2011   BARIATRIC FOLLOW UP 6 months status post Sleeve   Plan:  No orders of the defined types were placed in this encounter.   1. The patient was counseled regarding diet and exercise and was encouraged to continue with the good regimens which have been established.     2. Should continue current vitamin supplements.     3. The  patient had questions or concerns about her weight loss which were discussed in great detail.  We gave her extensive counseling on a reduced calorie diet. Discussed treatment options for helping her achieve the stated weight and health goals.   Rx Sent for Zofran .  She will contact us  if nausea worsens.  Rx sent for Prilosec.  She will notify us  if heartburn/water brash worsens     4. Return to clinic in 6 months or sooner for any issues or concerns.    Almarie MARLA Piles, NP

## 2023-11-24 ENCOUNTER — Inpatient Hospital Stay (HOSPITAL_COMMUNITY)
Admission: EM | Admit: 2023-11-24 | Discharge: 2023-11-27 | DRG: 291 | Disposition: A | Discharge status: Home / Self Care | Attending: Family Medicine | Admitting: Family Medicine

## 2023-11-24 ENCOUNTER — Other Ambulatory Visit: Payer: Self-pay

## 2023-11-24 ENCOUNTER — Emergency Department (HOSPITAL_COMMUNITY)

## 2023-11-24 ENCOUNTER — Encounter (HOSPITAL_COMMUNITY): Payer: Self-pay

## 2023-11-24 DIAGNOSIS — I350 Nonrheumatic aortic (valve) stenosis: Secondary | ICD-10-CM

## 2023-11-24 DIAGNOSIS — Z794 Long term (current) use of insulin: Secondary | ICD-10-CM

## 2023-11-24 DIAGNOSIS — D649 Anemia, unspecified: Secondary | ICD-10-CM | POA: Diagnosis present

## 2023-11-24 DIAGNOSIS — I10 Essential (primary) hypertension: Secondary | ICD-10-CM | POA: Diagnosis present

## 2023-11-24 DIAGNOSIS — E876 Hypokalemia: Principal | ICD-10-CM | POA: Diagnosis present

## 2023-11-24 DIAGNOSIS — G629 Polyneuropathy, unspecified: Secondary | ICD-10-CM

## 2023-11-24 DIAGNOSIS — Z79899 Other long term (current) drug therapy: Secondary | ICD-10-CM

## 2023-11-24 DIAGNOSIS — I428 Other cardiomyopathies: Secondary | ICD-10-CM | POA: Diagnosis present

## 2023-11-24 DIAGNOSIS — I110 Hypertensive heart disease with heart failure: Principal | ICD-10-CM | POA: Diagnosis present

## 2023-11-24 DIAGNOSIS — F419 Anxiety disorder, unspecified: Secondary | ICD-10-CM | POA: Diagnosis present

## 2023-11-24 DIAGNOSIS — I5043 Acute on chronic combined systolic (congestive) and diastolic (congestive) heart failure: Secondary | ICD-10-CM | POA: Diagnosis present

## 2023-11-24 DIAGNOSIS — G4733 Obstructive sleep apnea (adult) (pediatric): Secondary | ICD-10-CM | POA: Diagnosis present

## 2023-11-24 DIAGNOSIS — R7989 Other specified abnormal findings of blood chemistry: Secondary | ICD-10-CM | POA: Diagnosis present

## 2023-11-24 DIAGNOSIS — I119 Hypertensive heart disease without heart failure: Secondary | ICD-10-CM | POA: Diagnosis present

## 2023-11-24 DIAGNOSIS — Z7985 Long-term (current) use of injectable non-insulin antidiabetic drugs: Secondary | ICD-10-CM

## 2023-11-24 DIAGNOSIS — E1165 Type 2 diabetes mellitus with hyperglycemia: Secondary | ICD-10-CM | POA: Diagnosis present

## 2023-11-24 DIAGNOSIS — Z8249 Family history of ischemic heart disease and other diseases of the circulatory system: Secondary | ICD-10-CM

## 2023-11-24 DIAGNOSIS — Z7951 Long term (current) use of inhaled steroids: Secondary | ICD-10-CM

## 2023-11-24 DIAGNOSIS — Z7982 Long term (current) use of aspirin: Secondary | ICD-10-CM

## 2023-11-24 DIAGNOSIS — Z885 Allergy status to narcotic agent status: Secondary | ICD-10-CM

## 2023-11-24 DIAGNOSIS — Z91199 Patient's noncompliance with other medical treatment and regimen due to unspecified reason: Secondary | ICD-10-CM

## 2023-11-24 DIAGNOSIS — Z833 Family history of diabetes mellitus: Secondary | ICD-10-CM

## 2023-11-24 DIAGNOSIS — E1140 Type 2 diabetes mellitus with diabetic neuropathy, unspecified: Secondary | ICD-10-CM | POA: Diagnosis present

## 2023-11-24 DIAGNOSIS — Z1152 Encounter for screening for COVID-19: Secondary | ICD-10-CM

## 2023-11-24 DIAGNOSIS — F32A Depression, unspecified: Secondary | ICD-10-CM | POA: Diagnosis present

## 2023-11-24 DIAGNOSIS — K219 Gastro-esophageal reflux disease without esophagitis: Secondary | ICD-10-CM | POA: Diagnosis present

## 2023-11-24 DIAGNOSIS — Z9981 Dependence on supplemental oxygen: Secondary | ICD-10-CM

## 2023-11-24 DIAGNOSIS — Z9884 Bariatric surgery status: Secondary | ICD-10-CM

## 2023-11-24 LAB — COMPREHENSIVE METABOLIC PANEL WITH GFR
ALT: 64 U/L — ABNORMAL HIGH (ref 0–44)
AST: 74 U/L — ABNORMAL HIGH (ref 15–41)
Albumin: 3.7 g/dL (ref 3.5–5.0)
Alkaline Phosphatase: 100 U/L (ref 38–126)
Anion gap: 13 (ref 5–15)
BUN: 18 mg/dL (ref 6–20)
CO2: 32 mmol/L (ref 22–32)
Calcium: 8.5 mg/dL — ABNORMAL LOW (ref 8.9–10.3)
Chloride: 95 mmol/L — ABNORMAL LOW (ref 98–111)
Creatinine, Ser: 1.33 mg/dL — ABNORMAL HIGH (ref 0.44–1.00)
GFR, Estimated: 49 mL/min — ABNORMAL LOW (ref >60)
Glucose, Bld: 96 mg/dL (ref 70–99)
Potassium: 2.3 mmol/L — CL (ref 3.5–5.1)
Sodium: 140 mmol/L (ref 135–145)
Total Bilirubin: 1 mg/dL (ref 0.0–1.2)
Total Protein: 7.4 g/dL (ref 6.5–8.1)

## 2023-11-24 LAB — RESP PANEL BY RT-PCR (RSV, FLU A&B, COVID)  RVPGX2
Influenza A by PCR: NEGATIVE
Influenza B by PCR: NEGATIVE
Resp Syncytial Virus by PCR: NEGATIVE
SARS Coronavirus 2 by RT PCR: NEGATIVE

## 2023-11-24 LAB — CBC WITH DIFFERENTIAL/PLATELET
Abs Immature Granulocytes: 0.03 K/uL (ref 0.00–0.07)
Basophils Absolute: 0 K/uL (ref 0.0–0.1)
Basophils Relative: 1 %
Eosinophils Absolute: 0.1 K/uL (ref 0.0–0.5)
Eosinophils Relative: 2 %
HCT: 33.5 % — ABNORMAL LOW (ref 36.0–46.0)
Hemoglobin: 10.6 g/dL — ABNORMAL LOW (ref 12.0–15.0)
Immature Granulocytes: 1 %
Lymphocytes Relative: 19 %
Lymphs Abs: 0.7 K/uL (ref 0.7–4.0)
MCH: 23.2 pg — ABNORMAL LOW (ref 26.0–34.0)
MCHC: 31.6 g/dL (ref 30.0–36.0)
MCV: 73.3 fL — ABNORMAL LOW (ref 80.0–100.0)
Monocytes Absolute: 0.5 K/uL (ref 0.1–1.0)
Monocytes Relative: 12 %
Neutro Abs: 2.6 K/uL (ref 1.7–7.7)
Neutrophils Relative %: 65 %
Platelets: 210 K/uL (ref 150–400)
RBC: 4.57 MIL/uL (ref 3.87–5.11)
RDW: 14.7 % (ref 11.5–15.5)
WBC: 4 K/uL (ref 4.0–10.5)
nRBC: 0 % (ref 0.0–0.2)

## 2023-11-24 LAB — MAGNESIUM: Magnesium: 1.4 mg/dL — ABNORMAL LOW (ref 1.7–2.4)

## 2023-11-24 LAB — TROPONIN I (HIGH SENSITIVITY)
Troponin I (High Sensitivity): 52 ng/L — ABNORMAL HIGH (ref <18)
Troponin I (High Sensitivity): 52 ng/L — ABNORMAL HIGH (ref <18)

## 2023-11-24 LAB — BRAIN NATRIURETIC PEPTIDE: B Natriuretic Peptide: 1848 pg/mL — ABNORMAL HIGH (ref 0.0–100.0)

## 2023-11-24 MED ORDER — SODIUM CHLORIDE 0.9% FLUSH: 3.0000 mL | INTRAVENOUS | Status: DC | PRN

## 2023-11-24 MED ORDER — SODIUM CHLORIDE 0.9% FLUSH
3.0000 mL | Freq: Two times a day (BID) | INTRAVENOUS | Status: DC
  Administered 2023-11-24 – 2023-11-25 (×3): 3 mL via INTRAVENOUS

## 2023-11-24 MED ORDER — POTASSIUM CHLORIDE 10 MEQ/100ML IV SOLN
10.0000 meq | INTRAVENOUS | Status: AC
  Administered 2023-11-24 (×4): 10 meq via INTRAVENOUS
  Filled 2023-11-24 (×4): qty 100

## 2023-11-24 MED ORDER — FLUTICASONE FUROATE-VILANTEROL 100-25 MCG/ACT IN AEPB
1.0000 | INHALATION_SPRAY | Freq: Every day | RESPIRATORY_TRACT | Status: DC
  Administered 2023-11-25 – 2023-11-27 (×3): 1 via RESPIRATORY_TRACT
  Filled 2023-11-24: qty 28

## 2023-11-24 MED ORDER — NITROGLYCERIN 0.4 MG SL SUBL: 0.4000 mg | SUBLINGUAL_TABLET | SUBLINGUAL | Status: DC | PRN

## 2023-11-24 MED ORDER — DAPAGLIFLOZIN PROPANEDIOL 10 MG PO TABS
10.0000 mg | ORAL_TABLET | Freq: Every day | ORAL | Status: DC
  Administered 2023-11-25 – 2023-11-27 (×3): 10 mg via ORAL
  Filled 2023-11-24 (×3): qty 1

## 2023-11-24 MED ORDER — HEPARIN SODIUM (PORCINE) 5000 UNIT/ML IJ SOLN
5000.0000 [IU] | Freq: Three times a day (TID) | INTRAMUSCULAR | Status: DC
  Administered 2023-11-24 – 2023-11-27 (×8): 5000 [IU] via SUBCUTANEOUS
  Filled 2023-11-24 (×8): qty 1

## 2023-11-24 MED ORDER — ONDANSETRON HCL 4 MG/2ML IJ SOLN: 4.0000 mg | Freq: Four times a day (QID) | INTRAMUSCULAR | Status: DC | PRN

## 2023-11-24 MED ORDER — CYCLOBENZAPRINE HCL 10 MG PO TABS
10.0000 mg | ORAL_TABLET | Freq: Two times a day (BID) | ORAL | Status: DC | PRN
  Administered 2023-11-26: 10 mg via ORAL
  Filled 2023-11-24: qty 1

## 2023-11-24 MED ORDER — TRAZODONE HCL 50 MG PO TABS
150.0000 mg | ORAL_TABLET | Freq: Every day | ORAL | Status: DC
  Administered 2023-11-24 – 2023-11-26 (×3): 150 mg via ORAL
  Filled 2023-11-24 (×3): qty 3

## 2023-11-24 MED ORDER — ACETAMINOPHEN 325 MG PO TABS
650.0000 mg | ORAL_TABLET | ORAL | Status: DC | PRN
  Administered 2023-11-24 – 2023-11-25 (×2): 650 mg via ORAL
  Filled 2023-11-24 (×2): qty 2

## 2023-11-24 MED ORDER — HYDRALAZINE HCL 20 MG/ML IJ SOLN: 10.0000 mg | INTRAMUSCULAR | Status: DC | PRN

## 2023-11-24 MED ORDER — ISOSORBIDE MONONITRATE ER 60 MG PO TB24
60.0000 mg | ORAL_TABLET | Freq: Every day | ORAL | Status: DC
  Administered 2023-11-25: 60 mg via ORAL
  Filled 2023-11-24 (×2): qty 1

## 2023-11-24 MED ORDER — MAGNESIUM SULFATE 2 GM/50ML IV SOLN
2.0000 g | Freq: Once | INTRAVENOUS | Status: AC
Start: 2023-11-24 — End: 2023-11-24
  Administered 2023-11-24: 2 g via INTRAVENOUS
  Filled 2023-11-24: qty 50

## 2023-11-24 MED ORDER — POTASSIUM CHLORIDE CRYS ER 20 MEQ PO TBCR
40.0000 meq | EXTENDED_RELEASE_TABLET | Freq: Two times a day (BID) | ORAL | Status: DC
  Administered 2023-11-24 – 2023-11-26 (×5): 40 meq via ORAL
  Filled 2023-11-24 (×5): qty 2

## 2023-11-24 MED ORDER — HYDRALAZINE HCL 50 MG PO TABS
100.0000 mg | ORAL_TABLET | Freq: Three times a day (TID) | ORAL | Status: DC
  Administered 2023-11-24 – 2023-11-25 (×4): 100 mg via ORAL
  Filled 2023-11-24 (×5): qty 2

## 2023-11-24 MED ORDER — CARVEDILOL 12.5 MG PO TABS
25.0000 mg | ORAL_TABLET | Freq: Two times a day (BID) | ORAL | Status: DC
  Administered 2023-11-24 – 2023-11-27 (×6): 25 mg via ORAL
  Filled 2023-11-24 (×6): qty 2

## 2023-11-24 MED ORDER — SPIRONOLACTONE 25 MG PO TABS
25.0000 mg | ORAL_TABLET | Freq: Every day | ORAL | Status: DC
  Administered 2023-11-25: 25 mg via ORAL
  Filled 2023-11-24 (×2): qty 1

## 2023-11-24 MED ORDER — SODIUM CHLORIDE 0.9 % IV SOLN: 250.0000 mL | INTRAVENOUS | Status: DC | PRN

## 2023-11-24 MED ORDER — CLONAZEPAM 0.5 MG PO TABS
0.5000 mg | ORAL_TABLET | Freq: Two times a day (BID) | ORAL | Status: DC | PRN
  Administered 2023-11-25 – 2023-11-26 (×3): 0.5 mg via ORAL
  Filled 2023-11-24 (×3): qty 1

## 2023-11-24 MED ORDER — ALBUTEROL SULFATE (2.5 MG/3ML) 0.083% IN NEBU: 2.5000 mg | INHALATION_SOLUTION | Freq: Four times a day (QID) | RESPIRATORY_TRACT | Status: DC | PRN

## 2023-11-24 MED ORDER — SACUBITRIL-VALSARTAN 97-103 MG PO TABS
1.0000 | ORAL_TABLET | Freq: Two times a day (BID) | ORAL | Status: DC
  Administered 2023-11-24 – 2023-11-27 (×6): 1 via ORAL
  Filled 2023-11-24 (×6): qty 1

## 2023-11-24 MED ORDER — POTASSIUM CHLORIDE CRYS ER 20 MEQ PO TBCR
40.0000 meq | EXTENDED_RELEASE_TABLET | Freq: Once | ORAL | Status: AC
  Administered 2023-11-24: 40 meq via ORAL
  Filled 2023-11-24: qty 2

## 2023-11-24 MED ORDER — GUAIFENESIN-DM 100-10 MG/5ML PO SYRP
5.0000 mL | ORAL_SOLUTION | ORAL | Status: DC | PRN
  Administered 2023-11-24 – 2023-11-26 (×2): 5 mL via ORAL
  Filled 2023-11-24 (×2): qty 5

## 2023-11-24 NOTE — ED Triage Notes (Signed)
 Pt arrived via POV c/o bilateral lower extremity swelling X 1 week. Pt reports she has not called her cardiologist and has SOB while laying down in bed. Pt has Hx of CHF.

## 2023-11-24 NOTE — Consult Note (Signed)
 CARDIOLOGY CONSULT NOTE    Patient ID: Chelsey Santiago; [id number]; [birth year]   Admit date: 11/24/2023 Date of Consult: 11/24/2023  Primary Care Provider: Leavy Waddell NOVAK, FNP Primary Cardiologist:  Primary Electrophysiologist:     History of Present Illness:   Chelsey Santiago is a 48 year old F known to have NICM LVEF 30 to 35%, HTN, OSA on O2, mild aortic valve stenosis, DM 2, history of gastric sleeve surgery presented to the ER with DOE, orthopnea, PND, bilateral leg swelling x few days prior to presentation.  She was not compliant to fluid intake due to outside temperature being 103F and had to drink a lot of water to stay hydrated.  She also had vomiting yesterday for 3 times.  Otherwise, she has been compliant to medications.  BNP elevated, 1848.  Chest x-ray showed cardiomegaly and pulmonary vascular congestion.  Labs remarkable for severe hypokalemia, K 2.3.  Past Medical History:  Diagnosis Date   Anemia    H&H of 10.6/33 and 07/2008 and 11.9/35 and 09/2010   Anxiety    Chronic combined systolic and diastolic CHF (congestive heart failure) (HCC)    a. EF 40-45% by echo in 12/2015 with cath showing normal cors b. EF at 45% by repeat echo in 10/2018 c. EF at 30-35% in 03/2020   Depression with anxiety    Diabetes mellitus without complication (HCC)    Diverticulitis    09/2020   Hypertension    Hypertensive heart disease 2009   Pulmonary edema postpartum; mild to moderate mitral regurgitation when hospitalized for CHF in 2009; Echocardiogram in 12/2009-no MR and normal EF; normal CXR in 09/2010   Migraine headache    Miscarriage 03/19/2013   Obesity 04/16/2009   Osteoarthritis, knee 03/29/2011   Preeclampsia    Pulmonary edema    Sleep apnea    Threatened abortion in early pregnancy 03/15/2013    Past Surgical History:  Procedure Laterality Date   BREAST REDUCTION SURGERY  2002   CARDIAC CATHETERIZATION N/A 12/22/2015   Procedure: Left Heart Cath and Coronary  Angiography;  Surgeon: Peter M Swaziland, MD;  Location: [facility] INVASIVE CV LAB;  Service: Cardiovascular;  Laterality: N/A;   CESAREAN SECTION N/A 04/09/2014   Procedure: CESAREAN SECTION;  Surgeon: Winton Felt, MD;  Location: [facility] ORS;  Service: Obstetrics;  Laterality: N/A;   CHOLECYSTECTOMY     HYSTERECTOMY ABDOMINAL WITH SALPINGECTOMY N/A 09/26/2019   Procedure: HYSTERECTOMY ABDOMINAL WITH SALPINGECTOMY;  Surgeon: Jayne Vonn DEL, MD;  Location: [facility];  Service: Gynecology;  Laterality: N/A;   LIPOMA EXCISION Right 09/26/2019   Procedure: EXCISION LIPOMA RIGHT VULVAR;  Surgeon: Jayne Vonn DEL, MD;  Location: [facility];  Service: Gynecology;  Laterality: Right;       Inpatient Medications: Scheduled Meds:  carvedilol   25 mg Oral BID WC   [START ON 11/25/2023] dapagliflozin  propanediol  10 mg Oral QAC breakfast   fluticasone  furoate-vilanterol  1 puff Inhalation Daily   heparin   5,000 Units Subcutaneous Q8H   hydrALAZINE   100 mg Oral TID   isosorbide  mononitrate  60 mg Oral Daily   potassium chloride  SA  40 mEq Oral BID   sacubitril -valsartan   1 tablet Oral BID   sodium chloride  flush  3 mL Intravenous Q12H   spironolactone   25 mg Oral Daily   traZODone   150 mg Oral QHS   Continuous Infusions:  sodium chloride      potassium chloride  10 mEq (11/24/23 1416)   PRN Meds: sodium chloride , acetaminophen , albuterol , clonazePAM , cyclobenzaprine , hydrALAZINE ,  nitroGLYCERIN , sodium chloride  flush  Allergies:    Allergies  Allergen Reactions   Diclofenac  Swelling    AND POSSIBLE SYNCOPE; tolerates ibuprofen  per pt   Tramadol  Nausea And Vomiting and Nausea Only    Itching (12/21); tolerates ibuprofen  per pt   Vicodin [Hydrocodone -Acetaminophen ] Itching and Nausea Only    Social History:   Social History   Socioeconomic History   Marital status: Married    Spouse name: Not on file   Number of children: 6   Years of education: Not on file   Highest education level: Not on file   Occupational History   Occupation: unemployed    Employer: UNEMPLOYED  Tobacco Use   Smoking status: Never    Passive exposure: Never   Smokeless tobacco: Never  Vaping Use   Vaping status: Never Used  Substance and Sexual Activity   Alcohol use: Yes    Comment: occ   Drug use: No   Sexual activity: Yes    Birth control/protection: Surgical    Comment: hyst  Other Topics Concern   Not on file  Social History Narrative   Lives in [place]   Engaged/boyfriend (father to youngest chid)   4 children: daughter (35 as of 2013), sons (29, 82, 55 as of 2013)   Religion: christian   Social Drivers of Corporate investment banker Strain: Not on file  Food Insecurity: No Food Insecurity (11/24/2023)   Hunger Vital Sign    Worried About Running Out of Food in the Last Year: Never true    Ran Out of Food in the Last Year: Never true  Transportation Needs: No Transportation Needs (11/24/2023)   PRAPARE - Administrator, Civil Service (Medical): No    Lack of Transportation (Non-Medical): No  Physical Activity: Not on file  Stress: Not on file  Social Connections: Not on file  Intimate Partner Violence: Not At Risk (11/24/2023)   Humiliation, Afraid, Rape, and Kick questionnaire    Fear of Current or Ex-Partner: No    Emotionally Abused: No    Physically Abused: No    Sexually Abused: No    Family History:    Family History  Problem Relation Age of Onset   Diabetes Mother 30   Heart disease Mother    Hyperlipidemia Paternal Grandfather    Hypertension Paternal Grandfather    Heart disease Father    Hypertension Father    Heart disease Maternal Grandmother 40   ADD / ADHD Son    Hypertension Maternal Uncle    Heart attack Brother    Sudden death Neg Hx    Colon cancer Neg Hx    Celiac disease Neg Hx    Inflammatory bowel disease Neg Hx      ROS:  Please see the history of present illness.  ROS  All other ROS reviewed and negative.     Physical Exam/Data:    Vitals:   11/24/23 1225 11/24/23 1230 11/24/23 1347 11/24/23 1500  BP:  (!) 145/94 (!) 176/107 (!) 176/107  Pulse: 75 78  76  Resp: 20 19 20    Temp:  97.9 F (36.6 C) 98.7 F (37.1 C) 98.7 F (37.1 C)  TempSrc:  Oral  Oral  SpO2: 93% 93% 100% 100%  Weight:    92.6 kg  Height:    5' 6 (1.676 m)    Intake/Output Summary (Last 24 hours) at 11/24/2023 1515 Last data filed at 11/24/2023 1500 Gross per 24 hour  Intake 286.45  ml  Output --  Net 286.45 ml   Filed Weights   11/24/23 1500  Weight: 92.6 kg   Body mass index is 32.95 kg/m.  General:  Well nourished, well developed, in no acute distress HEENT: normal Lymph: no adenopathy Neck: no JVD Endocrine:  No thryomegaly Vascular: No carotid bruits; FA pulses 2+ bilaterally without bruits  Cardiac:  normal S1, S2; RRR; no murmur  Lungs:  clear to auscultation bilaterally, no wheezing, rhonchi or rales  Abd: soft, nontender, no hepatomegaly  Ext: no edema Musculoskeletal:  No deformities, BUE and BLE strength normal and equal Skin: warm and dry  Neuro:  CNs 2-12 intact, no focal abnormalities noted Psych:  Normal affect   Laboratory Data:  Chemistry Recent Labs  Lab 11/24/23 0946  NA 140  K 2.3*  CL 95*  CO2 32  GLUCOSE 96  BUN 18  CREATININE 1.33*  CALCIUM  8.5*  GFRNONAA 49*  ANIONGAP 13    Recent Labs  Lab 11/24/23 0946  PROT 7.4  ALBUMIN 3.7  AST 74*  ALT 64*  ALKPHOS 100  BILITOT 1.0   Hematology Recent Labs  Lab 11/24/23 0946  WBC 4.0  RBC 4.57  HGB 10.6*  HCT 33.5*  MCV 73.3*  MCH 23.2*  MCHC 31.6  RDW 14.7  PLT 210   Cardiac EnzymesNo results for input(s): TROPONINI in the last 168 hours. No results for input(s): TROPIPOC in the last 168 hours.  BNP Recent Labs  Lab 11/24/23 0946  BNP 1,848.0*    DDimer No results for input(s): DDIMER in the last 168 hours.  Radiology/Studies:  DG Chest 2 View Result Date: 11/24/2023 CLINICAL DATA:  Shortness of breath. EXAM: CHEST  - 2 VIEW COMPARISON:  12/03/2022. FINDINGS: Stable cardiomegaly. Mediastinal contours are within normal limits. Central pulmonary vascular congestion. No focal consolidation, pleural effusion, or pneumothorax. No acute osseous abnormality. IMPRESSION: Cardiomegaly with central pulmonary vascular congestion. Electronically Signed   By: Harrietta Sherry M.D.   On: 11/24/2023 09:36    Assessment and Plan:   Acute on chronic systolic and diastolic heart failure: Presented with DOE, orthopnea, PND, productive cough at night and leg swelling b/l x few days.  She has been noncompliant to fluid restriction due to hot weather and needing to stay hydrated. BNP 1848.  Chest x-ray showed cardiomegaly and pulmonary vascular congestion.  Serum creatinine 1.33.  Start IV Lasix  80 mg twice daily only after serum K > 3.5 (need lab confirmation prior to starting Lasix ), resume home GDMT, carvedilol  25 mg twice daily, Entresto  97-103 mg twice daily, Farxiga  10 mg once daily, Imdur  60 mg once daily,  spironolactone  25 mg once daily.  Patient not taking hydralazine .  Severe hypokalemia: K 2.3.  Likely secondary to vomiting yesterday.  Keep K>4 and <5, Mg>2 and <3.  HTN, poorly controlled: Continue above medications.  Mild aortic valve stenosis in 2024: Outpatient surveillance with serial echocardiograms.   For questions or updates, please contact [facility] Please consult [email or web address] for contact info under Cardiology/STEMI.   Signed, Venita Seng Priya Zephan Beauchaine, MD 11/24/2023 3:15 PM

## 2023-11-24 NOTE — ED Provider Notes (Signed)
 [place] EMERGENCY DEPARTMENT AT [facility] Provider Note   CSN: [id number] Arrival date & time: 11/24/23  9149     Patient presents with: Leg Swelling   Chelsey Santiago is a 48 y.o. female.  History of hypertension, HFrEF, obesity, diabetes.  Echo 2024 showed EF of 30 to 35%, she presents the ER today complaining of bilateral lower extremity swelling. But for the past 4 days, she states she has also had a couple days of cough that is nonproductive, increased shortness of breath on exertion and some mild chest pain x 2 days.  No fever or chills.  No hemoptysis.  No abdominal pain, patient have some nausea and vomiting yesterday, not vomiting today.   Patient is on torsemide  40 mg daily, has taken an extra dose for the past 2 days without relief.  Notes a 5 pound weight gain.  She states she was herself daily, as she has had gastric sleeve surgery so she is always anxious to weigh herself to see if she has lost weight.   HPI     Prior to Admission medications   Medication Sig Start Date End Date Taking? Authorizing Provider  ACCU-CHEK GUIDE test strip USE AS DIRECTED TO TESTCBLOOD SUGAR TWICE DAILY. 06/11/20   [provider]  albuterol  (VENTOLIN  HFA) 108 (90 Base) MCG/ACT inhaler Inhale 1 puff into the lungs every 6 (six) hours as needed for wheezing or shortness of breath. 03/30/20   Antoinette Doe, MD  aspirin  EC 81 MG tablet Take 81 mg by mouth daily.    [provider]  budesonide -formoterol  (SYMBICORT ) 80-4.5 MCG/ACT inhaler Inhale 2 puffs into the lungs 2 (two) times daily. 03/30/20   Antoinette Doe, MD  carvedilol  (COREG ) 25 MG tablet Take 1 tablet (25 mg total) by mouth 2 (two) times daily with a meal. 11/09/22   Miriam Norris, NP  clonazePAM  (KLONOPIN ) 0.5 MG tablet Take 0.5 mg by mouth 2 (two) times daily. 12/19/18   [provider]  cyclobenzaprine  (FLEXERIL ) 10 MG tablet Take 1 tablet (10 mg total) by mouth 2 (two) times daily as needed  for muscle spasms. 11/16/22   Yolande Lamar BROCKS, MD  dapagliflozin  propanediol (FARXIGA ) 10 MG TABS tablet Take 1 tablet (10 mg total) by mouth daily before breakfast. 04/29/23   Rolan Ezra RAMAN, MD  hydrALAZINE  (APRESOLINE ) 100 MG tablet Take 1 tablet (100 mg total) by mouth 3 (three) times daily. 11/09/22   Miriam Norris, NP  ibuprofen  (ADVIL ) 600 MG tablet Take 1 tablet (600 mg total) by mouth every 6 (six) hours as needed. 07/20/22   Brenna Lin, MD  insulin  glargine (LANTUS  SOLOSTAR) 100 UNIT/ML Solostar Pen Inject 40 Units into the skin at bedtime. 05/03/22   Nida, Gebreselassie W, MD  insulin  lispro (HUMALOG) 100 UNIT/ML injection Inject 1 Units into the skin 3 (three) times daily before meals.    [provider]  Ipratropium-Albuterol  (COMBIVENT  RESPIMAT) 20-100 MCG/ACT AERS respimat Inhale 1 puff into the lungs every 6 (six) hours as needed for wheezing or shortness of breath. Patient not taking: Reported on 04/29/2023 05/04/18   Maree Bracken D, DO  ipratropium-albuterol  (DUONEB) 0.5-2.5 (3) MG/3ML SOLN Take 3 mLs by nebulization 3 (three) times daily. Patient not taking: Reported on 04/29/2023 04/09/21   Evonnie Lenis, MD  isosorbide  mononitrate (IMDUR ) 60 MG 24 hr tablet Take 1 tablet (60 mg total) by mouth daily. 04/29/23 07/28/23  Rolan Ezra RAMAN, MD  nitroGLYCERIN  (NITROSTAT ) 0.4 MG SL tablet Place 1 tablet (0.4  mg total) under the tongue every 5 (five) minutes as needed for chest pain. 12/21/22   Miriam Norris, NP  ondansetron  (ZOFRAN ) 4 MG tablet Take 4 mg by mouth 4 (four) times daily. 07/13/22   [provider]  OZEMPIC, 1 MG/DOSE, 4 MG/3ML SOPN Inject 1 mg into the skin once a week. Patient not taking: Reported on 04/29/2023 07/13/22   [provider]  potassium chloride  SA (KLOR-CON  M) 20 MEQ tablet Take 1 tablet (20 mEq total) by mouth daily. 04/29/23   Rolan Ezra RAMAN, MD  sacubitril -valsartan  (ENTRESTO ) 97-103 MG Take 1 tablet by mouth 2 (two) times  daily. 11/09/22   Miriam Norris, NP  spironolactone  (ALDACTONE ) 25 MG tablet Take 1 tablet (25 mg total) by mouth daily. 04/29/23 07/28/23  Rolan Ezra RAMAN, MD  torsemide  (DEMADEX ) 20 MG tablet Take 2 tablets (40 mg total) by mouth daily. 04/29/23   Rolan Ezra RAMAN, MD  traZODone  (DESYREL ) 100 MG tablet Take 2 tablets (200 mg total) by mouth at bedtime. 10/29/18   Ricky Fines, MD    Allergies: Diclofenac , Tramadol , and Vicodin [hydrocodone -acetaminophen ]    Review of Systems  Updated Vital Signs BP (!) 150/95 (BP Location: Right Arm)   Pulse 78   Temp 97.6 F (36.4 C) (Temporal)   Resp 16   Ht 5' 6 (1.676 m)   LMP 08/21/2019   SpO2 96%   BMI 32.97 kg/m   Physical Exam Vitals and nursing note reviewed.  Constitutional:      General: She is not in acute distress.    Appearance: She is well-developed.  HENT:     Head: Normocephalic and atraumatic.  Eyes:     Extraocular Movements: Extraocular movements intact.     Conjunctiva/sclera: Conjunctivae normal.     Pupils: Pupils are equal, round, and reactive to light.  Cardiovascular:     Rate and Rhythm: Normal rate and regular rhythm.     Heart sounds: No murmur heard. Pulmonary:     Effort: Pulmonary effort is normal. No respiratory distress.     Breath sounds: Normal breath sounds.  Abdominal:     Palpations: Abdomen is soft.     Tenderness: There is no abdominal tenderness.  Musculoskeletal:        General: No swelling.     Cervical back: Neck supple.  Skin:    General: Skin is warm and dry.     Capillary Refill: Capillary refill takes less than 2 seconds.  Neurological:     General: No focal deficit present.     Mental Status: She is alert and oriented to person, place, and time.  Psychiatric:        Mood and Affect: Mood normal.     (all labs ordered are listed, but only abnormal results are displayed) Labs Reviewed  BRAIN NATRIURETIC PEPTIDE  CBC WITH DIFFERENTIAL/PLATELET  COMPREHENSIVE METABOLIC  PANEL WITH GFR    EKG: None  Radiology: No results found.   .Critical Care  Performed by: Suellen Sherran LABOR, PA-C Authorized by: Suellen Sherran LABOR, PA-C   Critical care provider statement:    Critical care time (minutes):  30   Critical care time was exclusive of:  Separately billable procedures and treating other patients   Critical care was necessary to treat or prevent imminent or life-threatening deterioration of the following conditions: Severe hypokalemia with QTC prologation.   Critical care was time spent personally by me on the following activities:  Development of treatment plan with patient or surrogate,  discussions with consultants, evaluation of patient's response to treatment, examination of patient, ordering and review of laboratory studies, ordering and review of radiographic studies, ordering and performing treatments and interventions, pulse oximetry, re-evaluation of patient's condition, review of old charts and obtaining history from patient or surrogate   Care discussed with: admitting provider      Medications Ordered in the ED - No data to display  Clinical Course as of 11/24/23 1220  Thu Nov 24, 2023  1133 B Natriuretic Peptide(!): 1,848.0 [SS]    Clinical Course User Index [SS] Stotelmyre, Sera, Student-PA                                 Medical Decision Making This patient presents to the ED for concern of extremity swelling, cough, shortness of breath, this involves an extensive number of treatment options, and is a complaint that carries with it a high risk of complications and morbidity.  The differential diagnosis includes CHF, ACS, PE, pneumonia, asthma exacerbation, electrolyte disturbance, peripheral edema, other   Co morbidities that complicate the patient evaluation :   HFrEF, asthma, sleep apnea hypertension   Additional history obtained:  Additional history obtained from EMR External records from outside source obtained and reviewed  including prior notes, labs, echocardiogram   Lab Tests:  I Ordered, and personally interpreted labs.  The pertinent results include:   CBC-no leukocytosis, hemoglobin 10.6 is around her baseline CMP with potassium critically low at 2.3 kidney function around baseline with GFR of 49 BNP 1848, lower than previous Magnesium  low 1.4 Troponin-delta of 0, troponin is elevated at 52   Imaging Studies ordered:  I ordered imaging studies including chest x-ray which shows central vascular congestion without pleural effusion, overt pulmonary edema or infiltrate I independently visualized and interpreted imaging within scope of identifying emergent findings  I agree with the radiologist interpretation   Cardiac Monitoring: / EKG:  The patient was maintained on a cardiac monitor.  I personally viewed and interpreted the cardiac monitored which showed an underlying rhythm of: Sinus rhythm, QTc prolonged at 533   Consultations Obtained:  I requested consultation with the hospitalist Dr. Maree,  and discussed lab and imaging findings as well as pertinent plan - they recommend: Admit to hospital   Problem List / ED Course / Critical interventions / Medication management  Patient presents for concern of volume overload, she is having lower extremity edema, dry cough and shortness of breath.  She is not in respiratory distress, has some edema to the level of just below her knees, not improving despite increasing her diuretics at home.  She was found to have significant hypokalemia and mild hypomagnesemia as well, will replete these, she likely has some mild CHF as well, saturation initially normal but dropped to 89 to 90% so patient put on 2 L.  She will likely need some diuresis, though not emergently and at this time I will hold off until electrolytes are stabilized.  Discussed with hospitalist as above for admission. I ordered medication including oral and IV potassium for hypokalemia Reevaluation  of the patient after these medicines showed that the patient has symptoms unchanged  Patient was reevaluated frequently, no arrhythmias on cardiac monitoring   I have reviewed the patients home medicines and have made adjustments as needed   Social Determinants of Health:  Patient lives independently      Amount and/or Complexity of Data Reviewed Labs: ordered.  Radiology: ordered.  Risk Prescription drug management. Decision regarding hospitalization.        Final diagnoses:  None    ED Discharge Orders     None          Suellen Sherran DELENA DEVONNA 11/24/23 1227    Suzette Pac, MD 11/26/23 1013

## 2023-11-24 NOTE — H&P (Addendum)
 History and Physical    Chelsey Santiago FMW:[id number] DOB: Feb 21, 1976 DOA: 11/24/2023  PCP: Leavy Waddell NOVAK, FNP   Patient coming from: Home  Chief Complaint: Leg swelling  HPI: Chelsey Santiago is a 48 y.o. female with medical history significant for chronic systolic CHF with LVEF 30-35%, hypertension, OSA with nighttime oxygen , mild aortic valve stenosis, type 2 diabetes, depression/anxiety, peripheral neuropathy, and obesity with prior gastric sleeve surgery with over 60 pound weight loss.  She states that she has had bilateral lower extremity edema developed for approximately a week ago and she has gained approximately 6 pounds of weight up from 171 pounds to 177 pounds.  She has tried doubling up on her dose of torsemide  in the last 2 days, but without any significant improvement.  She claims to have some orthopnea as well as dyspnea on exertion along with chest pressure.  She has a mild cough when she lays flat as well.  Patient does state that she has had some nausea and vomiting in the last 1 day with decreased oral intake overall since her gastric surgery.  She denies any diarrhea.  She does continue to take her home potassium supplementation.   ED Course: Vital signs with some initial tachypnea and patient currently on 2 L nasal cannula.  Potassium 2.3 and magnesium  1.4.  Hemoglobin 10.6 and creatinine 1.33.  Mild elevation in AST and ALT at 74 and 64 respectively.  BNP 1848.  EKG with no significant abnormalities.  Chest x-ray with cardiomegaly and pulmonary vascular congestion.  Patient started on electrolyte supplementation IV in the ED.  Review of Systems: Reviewed as noted above, otherwise negative.  Past Medical History:  Diagnosis Date   Anemia    H&H of 10.6/33 and 07/2008 and 11.9/35 and 09/2010   Anxiety    Chronic combined systolic and diastolic CHF (congestive heart failure) (HCC)    a. EF 40-45% by echo in 12/2015 with cath showing normal cors b. EF at 45% by repeat echo  in 10/2018 c. EF at 30-35% in 03/2020   Depression with anxiety    Diabetes mellitus without complication (HCC)    Diverticulitis    09/2020   Hypertension    Hypertensive heart disease 2009   Pulmonary edema postpartum; mild to moderate mitral regurgitation when hospitalized for CHF in 2009; Echocardiogram in 12/2009-no MR and normal EF; normal CXR in 09/2010   Migraine headache    Miscarriage 03/19/2013   Obesity 04/16/2009   Osteoarthritis, knee 03/29/2011   Preeclampsia    Pulmonary edema    Sleep apnea    Threatened abortion in early pregnancy 03/15/2013    Past Surgical History:  Procedure Laterality Date   BREAST REDUCTION SURGERY  2002   CARDIAC CATHETERIZATION N/A 12/22/2015   Procedure: Left Heart Cath and Coronary Angiography;  Surgeon: Peter M Swaziland, MD;  Location: [facility] INVASIVE CV LAB;  Service: Cardiovascular;  Laterality: N/A;   CESAREAN SECTION N/A 04/09/2014   Procedure: CESAREAN SECTION;  Surgeon: Winton Felt, MD;  Location: [facility] ORS;  Service: Obstetrics;  Laterality: N/A;   CHOLECYSTECTOMY     HYSTERECTOMY ABDOMINAL WITH SALPINGECTOMY N/A 09/26/2019   Procedure: HYSTERECTOMY ABDOMINAL WITH SALPINGECTOMY;  Surgeon: Jayne Vonn DEL, MD;  Location: [facility] OR[facility];  Service: Gynecology;  Laterality: N/A;   LIPOMA EXCISION Right 09/26/2019   Procedure: EXCISION LIPOMA RIGHT VULVAR;  Surgeon: Jayne Vonn DEL, MD;  Location: [facility] ORS;  Service: Gynecology;  Laterality: Right;     reports that she has never smoked.  She has never been exposed to tobacco smoke. She has never used smokeless tobacco. She reports current alcohol use. She reports that she does not use drugs.  Allergies  Allergen Reactions   Diclofenac  Swelling    AND POSSIBLE SYNCOPE; tolerates ibuprofen  per pt   Tramadol  Nausea And Vomiting and Nausea Only    Itching (12/21); tolerates ibuprofen  per pt   Vicodin [Hydrocodone -Acetaminophen ] Itching and Nausea Only    Family History  Problem Relation Age of Onset    Diabetes Mother 35   Heart disease Mother    Hyperlipidemia Paternal Grandfather    Hypertension Paternal Grandfather    Heart disease Father    Hypertension Father    Heart disease Maternal Grandmother 23   ADD / ADHD Son    Hypertension Maternal Uncle    Heart attack Brother    Sudden death Neg Hx    Colon cancer Neg Hx    Celiac disease Neg Hx    Inflammatory bowel disease Neg Hx     Prior to Admission medications   Medication Sig Start Date End Date Taking? Authorizing Provider  ACCU-CHEK GUIDE test strip USE AS DIRECTED TO TESTCBLOOD SUGAR TWICE DAILY. 06/11/20   [provider]  albuterol  (VENTOLIN  HFA) 108 (90 Base) MCG/ACT inhaler Inhale 1 puff into the lungs every 6 (six) hours as needed for wheezing or shortness of breath. 03/30/20   Antoinette Doe, MD  aspirin  EC 81 MG tablet Take 81 mg by mouth daily.    [provider]  budesonide -formoterol  (SYMBICORT ) 80-4.5 MCG/ACT inhaler Inhale 2 puffs into the lungs 2 (two) times daily. 03/30/20   Antoinette Doe, MD  carvedilol  (COREG ) 25 MG tablet Take 1 tablet (25 mg total) by mouth 2 (two) times daily with a meal. 11/09/22   Miriam Norris, NP  clonazePAM  (KLONOPIN ) 0.5 MG tablet Take 0.5 mg by mouth 2 (two) times daily. 12/19/18   [provider]  cyclobenzaprine  (FLEXERIL ) 10 MG tablet Take 1 tablet (10 mg total) by mouth 2 (two) times daily as needed for muscle spasms. 11/16/22   Yolande Lamar BROCKS, MD  dapagliflozin  propanediol (FARXIGA ) 10 MG TABS tablet Take 1 tablet (10 mg total) by mouth daily before breakfast. 04/29/23   Rolan Ezra RAMAN, MD  hydrALAZINE  (APRESOLINE ) 100 MG tablet Take 1 tablet (100 mg total) by mouth 3 (three) times daily. 11/09/22   Miriam Norris, NP  ibuprofen  (ADVIL ) 600 MG tablet Take 1 tablet (600 mg total) by mouth every 6 (six) hours as needed. 07/20/22   Brenna Lin, MD  insulin  glargine (LANTUS  SOLOSTAR) 100 UNIT/ML Solostar Pen Inject 40 Units into the skin at bedtime.  05/03/22   Nida, Gebreselassie W, MD  insulin  lispro (HUMALOG) 100 UNIT/ML injection Inject 1 Units into the skin 3 (three) times daily before meals.    [provider]  Ipratropium-Albuterol  (COMBIVENT  RESPIMAT) 20-100 MCG/ACT AERS respimat Inhale 1 puff into the lungs every 6 (six) hours as needed for wheezing or shortness of breath. Patient not taking: Reported on 04/29/2023 05/04/18   Maree Bracken D, DO  ipratropium-albuterol  (DUONEB) 0.5-2.5 (3) MG/3ML SOLN Take 3 mLs by nebulization 3 (three) times daily. Patient not taking: Reported on 04/29/2023 04/09/21   Evonnie Lenis, MD  isosorbide  mononitrate (IMDUR ) 60 MG 24 hr tablet Take 1 tablet (60 mg total) by mouth daily. 04/29/23 07/28/23  Rolan Ezra RAMAN, MD  nitroGLYCERIN  (NITROSTAT ) 0.4 MG SL tablet Place 1 tablet (0.4 mg total) under the tongue every 5 (five) minutes as  needed for chest pain. 12/21/22   Miriam Norris, NP  ondansetron  (ZOFRAN ) 4 MG tablet Take 4 mg by mouth 4 (four) times daily. 07/13/22   [provider]  OZEMPIC, 1 MG/DOSE, 4 MG/3ML SOPN Inject 1 mg into the skin once a week. Patient not taking: Reported on 04/29/2023 07/13/22   [provider]  potassium chloride  SA (KLOR-CON  M) 20 MEQ tablet Take 1 tablet (20 mEq total) by mouth daily. 04/29/23   Rolan Ezra RAMAN, MD  sacubitril -valsartan  (ENTRESTO ) 97-103 MG Take 1 tablet by mouth 2 (two) times daily. 11/09/22   Miriam Norris, NP  spironolactone  (ALDACTONE ) 25 MG tablet Take 1 tablet (25 mg total) by mouth daily. 04/29/23 07/28/23  Rolan Ezra RAMAN, MD  torsemide  (DEMADEX ) 20 MG tablet Take 2 tablets (40 mg total) by mouth daily. 04/29/23   Rolan Ezra RAMAN, MD  traZODone  (DESYREL ) 100 MG tablet Take 2 tablets (200 mg total) by mouth at bedtime. 10/29/18   Ricky Fines, MD    Physical Exam: Vitals:   11/24/23 1143 11/24/23 1145 11/24/23 1200 11/24/23 1225  BP:  (!) 158/92 (!) 171/106   Pulse:  74 71 75  Resp:  14 (!) 23 20  Temp:       TempSrc:      SpO2: 100% 100% 92% 93%  Height:        Constitutional: NAD, calm, comfortable Vitals:   11/24/23 1143 11/24/23 1145 11/24/23 1200 11/24/23 1225  BP:  (!) 158/92 (!) 171/106   Pulse:  74 71 75  Resp:  14 (!) 23 20  Temp:      TempSrc:      SpO2: 100% 100% 92% 93%  Height:       Eyes: lids and conjunctivae normal Neck: normal, supple Respiratory: clear to auscultation bilaterally. Normal respiratory effort. No accessory muscle use.  2 L nasal cannula Cardiovascular: Regular rate and rhythm, no murmurs. Abdomen: no tenderness, no distention. Bowel sounds positive.  Musculoskeletal: Scant bilateral edema Skin: no rashes, lesions, ulcers.  Psychiatric: Flat affect  Labs on Admission: I have personally reviewed following labs and imaging studies  CBC: Recent Labs  Lab 11/24/23 0946  WBC 4.0  NEUTROABS 2.6  HGB 10.6*  HCT 33.5*  MCV 73.3*  PLT 210   Basic Metabolic Panel: Recent Labs  Lab 11/24/23 0946 11/24/23 1056  NA 140  --   K 2.3*  --   CL 95*  --   CO2 32  --   GLUCOSE 96  --   BUN 18  --   CREATININE 1.33*  --   CALCIUM  8.5*  --   MG  --  1.4*   GFR: CrCl cannot be calculated (Unknown ideal weight.). Liver Function Tests: Recent Labs  Lab 11/24/23 0946  AST 74*  ALT 64*  ALKPHOS 100  BILITOT 1.0  PROT 7.4  ALBUMIN 3.7   No results for input(s): LIPASE, AMYLASE in the last 168 hours. No results for input(s): AMMONIA in the last 168 hours. Coagulation Profile: No results for input(s): INR, PROTIME in the last 168 hours. Cardiac Enzymes: No results for input(s): CKTOTAL, CKMB, CKMBINDEX, TROPONINI in the last 168 hours. BNP (last 3 results) No results for input(s): PROBNP in the last 8760 hours. HbA1C: No results for input(s): HGBA1C in the last 72 hours. CBG: No results for input(s): GLUCAP in the last 168 hours. Lipid Profile: No results for input(s): CHOL, HDL, LDLCALC, TRIG, CHOLHDL,  LDLDIRECT in the last 72 hours. Thyroid   Function Tests: No results for input(s): TSH, T4TOTAL, FREET4, T3FREE, THYROIDAB in the last 72 hours. Anemia Panel: No results for input(s): VITAMINB12, FOLATE, FERRITIN, TIBC, IRON, RETICCTPCT in the last 72 hours. Urine analysis:    Component Value Date/Time   COLORURINE YELLOW 10/28/2022 0325   APPEARANCEUR HAZY (A) 10/28/2022 0325   LABSPEC 1.019 10/28/2022 0325   PHURINE 6.0 10/28/2022 0325   GLUCOSEU 50 (A) 10/28/2022 0325   HGBUR NEGATIVE 10/28/2022 0325   BILIRUBINUR NEGATIVE 10/28/2022 0325   KETONESUR NEGATIVE 10/28/2022 0325   PROTEINUR 100 (A) 10/28/2022 0325   UROBILINOGEN 0.2 04/29/2014 1115   NITRITE NEGATIVE 10/28/2022 0325   LEUKOCYTESUR NEGATIVE 10/28/2022 0325    Radiological Exams on Admission: DG Chest 2 View Result Date: 11/24/2023 CLINICAL DATA:  Shortness of breath. EXAM: CHEST - 2 VIEW COMPARISON:  12/03/2022. FINDINGS: Stable cardiomegaly. Mediastinal contours are within normal limits. Central pulmonary vascular congestion. No focal consolidation, pleural effusion, or pneumothorax. No acute osseous abnormality. IMPRESSION: Cardiomegaly with central pulmonary vascular congestion. Electronically Signed   By: Harrietta Sherry M.D.   On: 11/24/2023 09:36    EKG: Independently reviewed.  SR 69 bpm.  Assessment/Plan Principal Problem:   Acute on chronic combined systolic and diastolic ACC/AHA stage C congestive heart failure (HCC) Active Problems:   Troponin level elevated   Chronic anemia   Hypokalemia   OSA on CPAP   Cardiomyopathy due to hypertension (HCC)   Nonischemic cardiomyopathy (HCC)   Depression   Neuropathy   Hypomagnesemia   Uncontrolled type 2 diabetes mellitus with hyperglycemia (HCC)   Gastroesophageal reflux disease   Chronic hypertension    Acute on chronic systolic CHF exacerbation -Noted to have orthopnea, dyspnea on exertion, and volume overload clinically -BNP  with mild elevation and chest x-ray with cardiomegaly and pulmonary vascular congestion - Hold diuresis for now given severe electrolyte abnormalities - Repeat 2D echocardiogram with prior LVEF 30-35% and global hypokinesis 11/2022 - Monitor strict I's and O's and daily weights, Reds clip - Monitor on telemetry - Appreciate cardiology evaluation while hospitalized, follows with Dr. Rolan heart failure team - Continue other home medications with the exception of torsemide   Elevated troponin - Likely in the setting of demand due to above  Severe hypokalemia/hypomagnesemia -Possibly in the setting of nausea and vomiting with decreased appetite and overall oral intake since gastric surgery along with diuresis - Monitor telemetry, EKG with no significant abnormalities - Continue IV and p.o. repletion - Hold diuresis with IV or p.o. medications - Okay to keep spironolactone   Hypertension - Continue home antihypertensives - IV hydralazine  as needed for significant elevations  Type 2 diabetes - Carb modified diet with SSI  Depression/anxiety - Continue home medications  Peripheral neuropathy - Continue gabapentin   History of obesity with prior gastric sleeve surgery - Noted loss of 60 pounds  OSA - Wears oxygen  at night  DVT prophylaxis: Heparin  Code Status: Full Family Communication: None at bedside Disposition Plan: Admitted for CHF and severe electrolyte abnormalities Consults called: Cardiology Admission status: Observation, telemetry  Severity of Illness: The appropriate patient status for this patient is OBSERVATION. Observation status is judged to be reasonable and necessary in order to provide the required intensity of service to ensure the patient's safety. The patient's presenting symptoms, physical exam findings, and initial radiographic and laboratory data in the context of their medical condition is felt to place them at decreased risk for further clinical  deterioration. Furthermore, it is anticipated that the patient will be medically  stable for discharge from the hospital within 2 midnights of admission.    Kendra Woolford D Maree DO [facility]  If 7PM-7AM, please contact night-coverage [email or web address]  11/24/2023, 12:39 PM

## 2023-11-24 NOTE — TOC CM/SW Note (Signed)
 Transition of Care [facility]) - Inpatient Brief Assessment   Patient Details  Name: Chelsey Santiago MRN: [id number] Date of Birth: November 26, 1975  Transition of Care [facility]) CM/SW Contact:    Noreen KATHEE Pinal, LCSWA Phone Number: 11/24/2023, 12:29 PM   Clinical Narrative:  Transition of Care Department [facility]) has reviewed patient and no TOC needs have been identified at this time. We will continue to monitor patient advancement through interdisciplinary progression rounds. If new patient transition needs arise, please place a TOC consult.  Transition of Care Asessment: Insurance and Status: Insurance coverage has been reviewed Patient has primary care physician: Yes Home environment has been reviewed: Single Family Home Prior level of function:: Independent Prior/Current Home Services: No current home services Social Drivers of Health Review: SDOH reviewed no interventions necessary Readmission risk has been reviewed: Yes Transition of care needs: no transition of care needs at this time

## 2023-11-24 NOTE — Progress Notes (Signed)
   11/24/23 1943  BiPAP/CPAP/SIPAP  Reason BIPAP/CPAP not in use Non-compliant   Patient states she is supposed to wear a CPAP but doesn't. Does not want one while here in hospital. Told patient one would be provided for her if she changed her mind.

## 2023-11-25 ENCOUNTER — Observation Stay (HOSPITAL_COMMUNITY)

## 2023-11-25 DIAGNOSIS — I10 Essential (primary) hypertension: Secondary | ICD-10-CM | POA: Diagnosis not present

## 2023-11-25 DIAGNOSIS — Z91199 Patient's noncompliance with other medical treatment and regimen due to unspecified reason: Secondary | ICD-10-CM | POA: Diagnosis not present

## 2023-11-25 DIAGNOSIS — Z79899 Other long term (current) drug therapy: Secondary | ICD-10-CM | POA: Diagnosis not present

## 2023-11-25 DIAGNOSIS — Z833 Family history of diabetes mellitus: Secondary | ICD-10-CM | POA: Diagnosis not present

## 2023-11-25 DIAGNOSIS — E876 Hypokalemia: Secondary | ICD-10-CM | POA: Diagnosis present

## 2023-11-25 DIAGNOSIS — M7989 Other specified soft tissue disorders: Secondary | ICD-10-CM | POA: Diagnosis present

## 2023-11-25 DIAGNOSIS — I5021 Acute systolic (congestive) heart failure: Secondary | ICD-10-CM | POA: Diagnosis not present

## 2023-11-25 DIAGNOSIS — Z9981 Dependence on supplemental oxygen: Secondary | ICD-10-CM | POA: Diagnosis not present

## 2023-11-25 DIAGNOSIS — E1165 Type 2 diabetes mellitus with hyperglycemia: Secondary | ICD-10-CM | POA: Diagnosis present

## 2023-11-25 DIAGNOSIS — I5043 Acute on chronic combined systolic (congestive) and diastolic (congestive) heart failure: Secondary | ICD-10-CM | POA: Diagnosis present

## 2023-11-25 DIAGNOSIS — Z1152 Encounter for screening for COVID-19: Secondary | ICD-10-CM | POA: Diagnosis not present

## 2023-11-25 DIAGNOSIS — Z7985 Long-term (current) use of injectable non-insulin antidiabetic drugs: Secondary | ICD-10-CM | POA: Diagnosis not present

## 2023-11-25 DIAGNOSIS — Z8249 Family history of ischemic heart disease and other diseases of the circulatory system: Secondary | ICD-10-CM | POA: Diagnosis not present

## 2023-11-25 DIAGNOSIS — D649 Anemia, unspecified: Secondary | ICD-10-CM | POA: Diagnosis present

## 2023-11-25 DIAGNOSIS — I428 Other cardiomyopathies: Secondary | ICD-10-CM | POA: Diagnosis present

## 2023-11-25 DIAGNOSIS — K219 Gastro-esophageal reflux disease without esophagitis: Secondary | ICD-10-CM | POA: Diagnosis present

## 2023-11-25 DIAGNOSIS — Z794 Long term (current) use of insulin: Secondary | ICD-10-CM | POA: Diagnosis not present

## 2023-11-25 DIAGNOSIS — F32A Depression, unspecified: Secondary | ICD-10-CM | POA: Diagnosis present

## 2023-11-25 DIAGNOSIS — Z885 Allergy status to narcotic agent status: Secondary | ICD-10-CM | POA: Diagnosis not present

## 2023-11-25 DIAGNOSIS — G4733 Obstructive sleep apnea (adult) (pediatric): Secondary | ICD-10-CM | POA: Diagnosis present

## 2023-11-25 DIAGNOSIS — F419 Anxiety disorder, unspecified: Secondary | ICD-10-CM | POA: Diagnosis present

## 2023-11-25 DIAGNOSIS — E1140 Type 2 diabetes mellitus with diabetic neuropathy, unspecified: Secondary | ICD-10-CM | POA: Diagnosis present

## 2023-11-25 DIAGNOSIS — Z7982 Long term (current) use of aspirin: Secondary | ICD-10-CM | POA: Diagnosis not present

## 2023-11-25 DIAGNOSIS — I110 Hypertensive heart disease with heart failure: Secondary | ICD-10-CM | POA: Diagnosis present

## 2023-11-25 DIAGNOSIS — Z9884 Bariatric surgery status: Secondary | ICD-10-CM | POA: Diagnosis not present

## 2023-11-25 DIAGNOSIS — Z7951 Long term (current) use of inhaled steroids: Secondary | ICD-10-CM | POA: Diagnosis not present

## 2023-11-25 LAB — ECHOCARDIOGRAM COMPLETE
AR max vel: 1.35 cm2
AV Area VTI: 1.31 cm2
AV Area mean vel: 1.26 cm2
AV Mean grad: 10.3 mmHg
AV Peak grad: 21 mmHg
Ao pk vel: 2.29 m/s
Area-P 1/2: 3.42 cm2
Height: 66 in
S' Lateral: 4.3 cm
Weight: 2800.72 [oz_av]

## 2023-11-25 LAB — MRSA NEXT GEN BY PCR, NASAL: MRSA by PCR Next Gen: POSITIVE — AB

## 2023-11-25 LAB — BRAIN NATRIURETIC PEPTIDE: B Natriuretic Peptide: 1083 pg/mL — ABNORMAL HIGH (ref 0.0–100.0)

## 2023-11-25 LAB — BASIC METABOLIC PANEL WITH GFR
Anion gap: 9 (ref 5–15)
BUN: 19 mg/dL (ref 6–20)
CO2: 29 mmol/L (ref 22–32)
Calcium: 8.5 mg/dL — ABNORMAL LOW (ref 8.9–10.3)
Chloride: 104 mmol/L (ref 98–111)
Creatinine, Ser: 1.29 mg/dL — ABNORMAL HIGH (ref 0.44–1.00)
GFR, Estimated: 51 mL/min — ABNORMAL LOW (ref >60)
Glucose, Bld: 89 mg/dL (ref 70–99)
Potassium: 4 mmol/L (ref 3.5–5.1)
Sodium: 142 mmol/L (ref 135–145)

## 2023-11-25 LAB — GLUCOSE, CAPILLARY
Glucose-Capillary: 84 mg/dL (ref 70–99)
Glucose-Capillary: 87 mg/dL (ref 70–99)
Glucose-Capillary: 103 mg/dL — ABNORMAL HIGH (ref 70–99)
Glucose-Capillary: 139 mg/dL — ABNORMAL HIGH (ref 70–99)
Glucose-Capillary: 99 mg/dL (ref 70–99)

## 2023-11-25 LAB — MAGNESIUM: Magnesium: 1.9 mg/dL (ref 1.7–2.4)

## 2023-11-25 MED ORDER — FUROSEMIDE 10 MG/ML IJ SOLN: 80.0000 mg | Freq: Two times a day (BID) | INTRAMUSCULAR | Status: DC

## 2023-11-25 MED ORDER — INSULIN ASPART 100 UNIT/ML IJ SOLN: 0.0000 [IU] | Freq: Every day | INTRAMUSCULAR | Status: DC

## 2023-11-25 MED ORDER — CHLORHEXIDINE GLUCONATE CLOTH 2 % EX PADS
6.0000 | MEDICATED_PAD | Freq: Every day | CUTANEOUS | Status: DC
Start: 2023-11-26 — End: 2023-12-01

## 2023-11-25 MED ORDER — PANTOPRAZOLE SODIUM 40 MG PO TBEC
40.0000 mg | DELAYED_RELEASE_TABLET | Freq: Every day | ORAL | Status: DC
  Administered 2023-11-25 – 2023-11-27 (×3): 40 mg via ORAL
  Filled 2023-11-25 (×3): qty 1

## 2023-11-25 MED ORDER — FUROSEMIDE 10 MG/ML IJ SOLN
80.0000 mg | Freq: Two times a day (BID) | INTRAMUSCULAR | Status: DC
  Administered 2023-11-25 – 2023-11-27 (×5): 80 mg via INTRAVENOUS
  Filled 2023-11-25 (×5): qty 8

## 2023-11-25 MED ORDER — ASPIRIN 81 MG PO TBEC
81.0000 mg | DELAYED_RELEASE_TABLET | Freq: Every day | ORAL | Status: DC
  Administered 2023-11-25 – 2023-11-27 (×3): 81 mg via ORAL
  Filled 2023-11-25 (×3): qty 1

## 2023-11-25 MED ORDER — MUPIROCIN 2 % EX OINT
TOPICAL_OINTMENT | Freq: Two times a day (BID) | CUTANEOUS | Status: AC
  Administered 2023-11-26: 1 via NASAL
  Filled 2023-11-25 (×2): qty 22

## 2023-11-25 MED ORDER — INSULIN ASPART 100 UNIT/ML IJ SOLN
0.0000 [IU] | Freq: Three times a day (TID) | INTRAMUSCULAR | Status: DC
  Administered 2023-11-25 – 2023-11-26 (×3): 1 [IU] via SUBCUTANEOUS

## 2023-11-25 MED ORDER — SENNOSIDES-DOCUSATE SODIUM 8.6-50 MG PO TABS
1.0000 | ORAL_TABLET | Freq: Two times a day (BID) | ORAL | Status: DC
  Administered 2023-11-25: 1 via ORAL
  Filled 2023-11-25 (×3): qty 1

## 2023-11-25 MED ORDER — LAMOTRIGINE ER 100 MG PO TB24
100.0000 mg | ORAL_TABLET | Freq: Every day | ORAL | Status: DC
  Administered 2023-11-26 – 2023-11-27 (×2): 100 mg via ORAL
  Filled 2023-11-25 (×4): qty 1

## 2023-11-25 NOTE — Plan of Care (Signed)

## 2023-11-25 NOTE — Progress Notes (Signed)
  Echocardiogram 2D Echocardiogram has been performed.  Koleen KANDICE Popper, RDCS 11/25/2023, 12:40 PM

## 2023-11-25 NOTE — Progress Notes (Signed)
 PROGRESS NOTE    Patient: Chelsey Santiago                            PCP: Leavy Waddell NOVAK, FNP                    DOB: [birth year]            DOA: 11/24/2023 FMW:[id number]             DOS: 11/25/2023, 1:37 PM   LOS: 0 days   Date of Service: The patient was seen and examined on 11/25/2023  Subjective:   The patient was seen and examined this morning. Hemodynamically stable. No issues overnight .  Brief Narrative:   Chelsey Santiago is a 48 y.o. female with medical history significant for chronic systolic CHF with LVEF 30-35%, hypertension, OSA with nighttime oxygen , mild aortic valve stenosis, type 2 diabetes, depression/anxiety, peripheral neuropathy, and obesity with prior gastric sleeve surgery with over 60 pound weight loss.  She states that she has had bilateral lower extremity edema developed for approximately a week ago and she has gained approximately 6 pounds of weight up from 171 pounds to 177 pounds.  She has tried doubling up on her dose of torsemide  in the last 2 days, but without any significant improvement.  She claims to have some orthopnea as well as dyspnea on exertion along with chest pressure.  She has a mild cough when she lays flat as well.  Patient does state that she has had some nausea and vomiting in the last 1 day with decreased oral intake overall since her gastric surgery.  She denies any diarrhea.  She does continue to take her home potassium supplementation.   ED Course: Vital signs with some initial tachypnea and patient currently on 2 L nasal cannula.  Potassium 2.3 and magnesium  1.4.  Hemoglobin 10.6 and creatinine 1.33.  Mild elevation in AST and ALT at 74 and 64 respectively.  BNP 1848.  EKG with no significant abnormalities.  Chest x-ray with cardiomegaly and pulmonary vascular congestion.  Patient started on electrolyte supplementation IV in the ED.      Assessment & Plan:   Principal Problem:   Acute on chronic combined systolic and diastolic  ACC/AHA stage C congestive heart failure (HCC) Active Problems:   Troponin level elevated   Chronic anemia   Hypokalemia   OSA on CPAP   Cardiomyopathy due to hypertension (HCC)   Nonischemic cardiomyopathy (HCC)   Depression   Neuropathy   Hypomagnesemia   Uncontrolled type 2 diabetes mellitus with hyperglycemia (HCC)   Gastroesophageal reflux disease   Chronic hypertension   Hypokalemia due to excessive gastrointestinal loss of potassium   Acute on chronic combined systolic and diastolic CHF (congestive heart failure) (HCC)   Acute on chronic systolic CHF exacerbation -POA bilateral lower extremity edema, orthopnea, elevated BNP 1083.0 - Chest x-ray.  For cardiomegaly, pulmonary congestion -Volume overload - Held Lasix  yesterday due to hypokalemia, now that has improved, resuming Lasix  80 mg IV twice daily -Continue home GDMT: Carvedilol  25 twice daily, Entresto  97/103 mg twice daily, Farxiga  10 mg once daily, Imdur  60 mg once daily, spironolactone  25 mg once daily.  -  - Repeat 2D echocardiogram  Echo:w prior LVEF 30-35% and global hypokinesis 11/2022 - Monitor strict I's and O's and daily weights, Reds clip  - Appreciate cardiology evaluation while hospitalized,  (follows with Dr. Rolan heart failure team)  Elevated troponin - Denies any chest pain, no acute EKG changes -  -Continue current meds, monitor   Severe hypokalemia/hypomagnesemia - POA K2.3 >>> 4.0 Keeping K > mag. >2  - In the setting of nausea, vomiting, volume overload  decreased appetite and overall oral intake since gastric surgery along with diuresis - Monitor telemetry, EKG with no significant abnormalities - Okay to keep spironolactone    Hypertension - Continue home antihypertensives - IV hydralazine  as needed for significant elevations   Type 2 diabetes - Monitor CBG, carb modified diet with SSI   Depression/anxiety - Continue home medications   Peripheral neuropathy - Continue  gabapentin    History of obesity with prior gastric sleeve surgery - Noted loss of 60 pounds   OSA - Wears oxygen  at night    ------------------------------------------------------------------------------------------------------------------- Nutritional status:  The patient's BMI is: Body mass index is 28.25 kg/m. I agree with the assessment and plan as outlined  Consult: Cardiology  -------------------------------------------------------------------------------------------------------------------  DVT prophylaxis:  heparin  injection 5,000 Units Start: 11/24/23 2200   Code Status:   Code Status: Full Code  Family Communication: No family member present at bedside--Advance care planning has been discussed.   Admission status:   Status is: Observation The patient remains OBS appropriate and will d/c before 2 midnights.   Disposition: From  - home             Planning for discharge in 1-2 days: to   Procedures:   No admission procedures for hospital encounter.   Antimicrobials:  Anti-infectives (From admission, onward)    None        Medication:   aspirin  EC  81 mg Oral Daily   carvedilol   25 mg Oral BID WC   dapagliflozin  propanediol  10 mg Oral QAC breakfast   fluticasone  furoate-vilanterol  1 puff Inhalation Daily   furosemide   80 mg Intravenous BID   heparin   5,000 Units Subcutaneous Q8H   hydrALAZINE   100 mg Oral TID   insulin  aspart  0-5 Units Subcutaneous QHS   insulin  aspart  0-9 Units Subcutaneous TID WC   isosorbide  mononitrate  60 mg Oral Daily   lamoTRIgine   100 mg Oral Daily   mupirocin  ointment   Nasal BID   pantoprazole   40 mg Oral Daily   potassium chloride  SA  40 mEq Oral BID   sacubitril -valsartan   1 tablet Oral BID   senna-docusate  1 tablet Oral BID   sodium chloride  flush  3 mL Intravenous Q12H   spironolactone   25 mg Oral Daily   traZODone   150 mg Oral QHS    acetaminophen , albuterol , clonazePAM , cyclobenzaprine ,  guaiFENesin -dextromethorphan , hydrALAZINE , nitroGLYCERIN , sodium chloride  flush   Objective:   Vitals:   11/24/23 2034 11/25/23 0500 11/25/23 0528 11/25/23 0858  BP: (!) 128/93  124/69 (!) 160/110  Pulse: 69  74 72  Resp: 20     Temp: 98.8 F (37.1 C)  98.2 F (36.8 C) 97.7 F (36.5 C)  TempSrc: Oral  Oral Oral  SpO2: 99%  99% 97%  Weight: 79.5 kg 79.4 kg    Height:        Intake/Output Summary (Last 24 hours) at 11/25/2023 1337 Last data filed at 11/25/2023 0531 Gross per 24 hour  Intake 1486.45 ml  Output --  Net 1486.45 ml   Filed Weights   11/24/23 1500 11/24/23 2034 11/25/23 0500  Weight: 92.6 kg 79.5 kg 79.4 kg     Physical examination:   Constitution:  Alert, cooperative, no distress,  Appears calm and comfortable  Psychiatric:   Normal and stable mood and affect, cognition intact,   HEENT:        Normocephalic, PERRL, otherwise with in Normal limits  Chest:         Chest symmetric Cardio vascular:  S1/S2, RRR, No murmure, No Rubs or Gallops  pulmonary: Clear to auscultation bilaterally, respirations unlabored, negative wheezes / crackles Abdomen: Soft, non-tender, non-distended, bowel sounds,no masses, no organomegaly Muscular skeletal: Limited exam - in bed, able to move all 4 extremities,   Neuro: CNII-XII intact. , normal motor and sensation, reflexes intact  Extremities: +2  pitting edema lower extremities, +2 pulses  Skin: Dry, warm to touch, negative for any Rashes, No open wounds Wounds: per nursing documentation   ------------------------------------------------------------------------------------------------------------------------------------------    LABs:     Latest Ref Rng & Units 11/24/2023    9:46 AM 11/16/2022    9:07 AM 10/28/2022    3:32 AM  CBC  WBC 4.0 - 10.5 K/uL 4.0  8.4  8.5   Hemoglobin 12.0 - 15.0 g/dL 89.3  89.4  87.7   Hematocrit 36.0 - 46.0 % 33.5  33.2  39.2   Platelets 150 - 400 K/uL 210  189  212       Latest Ref Rng &  Units 11/25/2023    4:16 AM 11/24/2023    9:46 AM 04/29/2023    3:11 PM  CMP  Glucose 70 - 99 mg/dL 89  96  831   BUN 6 - 20 mg/dL 19  18  20    Creatinine 0.44 - 1.00 mg/dL 8.70  8.66  8.70   Sodium 135 - 145 mmol/L 142  140  140   Potassium 3.5 - 5.1 mmol/L 4.0  2.3  3.4   Chloride 98 - 111 mmol/L 104  95  101   CO2 22 - 32 mmol/L 29  32  30   Calcium  8.9 - 10.3 mg/dL 8.5  8.5  8.8   Total Protein 6.5 - 8.1 g/dL  7.4    Total Bilirubin 0.0 - 1.2 mg/dL  1.0    Alkaline Phos 38 - 126 U/L  100    AST 15 - 41 U/L  74    ALT 0 - 44 U/L  64         Micro Results Recent Results (from the past 240 hours)  Resp panel by RT-PCR (RSV, Flu A&B, Covid) Anterior Nasal Swab     Status: None   Collection Time: 11/24/23 10:15 AM   Specimen: Anterior Nasal Swab  Result Value Ref Range Status   SARS Coronavirus 2 by RT PCR NEGATIVE NEGATIVE Final    Comment: (NOTE) SARS-CoV-2 target nucleic acids are NOT DETECTED.  The SARS-CoV-2 RNA is generally detectable in upper respiratory specimens during the acute phase of infection. The lowest concentration of SARS-CoV-2 viral copies this assay can detect is 138 copies/mL. A negative result does not preclude SARS-Cov-2 infection and should not be used as the sole basis for treatment or other patient management decisions. A negative result may occur with  improper specimen collection/handling, submission of specimen other than nasopharyngeal swab, presence of viral mutation(s) within the areas targeted by this assay, and inadequate number of viral copies(<138 copies/mL). A negative result must be combined with clinical observations, patient history, and epidemiological information. The expected result is Negative.  Fact Sheet for Patients:  BloggerCourse.com  Fact Sheet for Healthcare Providers:  SeriousBroker.it  This test is no t yet approved  or cleared by the [place]  FDA and  has been  authorized for detection and/or diagnosis of SARS-CoV-2 by FDA under an Emergency Use Authorization (EUA). This EUA will remain  in effect (meaning this test can be used) for the duration of the COVID-19 declaration under Section 564(b)(1) of the Act, 21 U.S.C.section 360bbb-3(b)(1), unless the authorization is terminated  or revoked sooner.       Influenza A by PCR NEGATIVE NEGATIVE Final   Influenza B by PCR NEGATIVE NEGATIVE Final    Comment: (NOTE) The Xpert Xpress SARS-CoV-2/FLU/RSV plus assay is intended as an aid in the diagnosis of influenza from Nasopharyngeal swab specimens and should not be used as a sole basis for treatment. Nasal washings and aspirates are unacceptable for Xpert Xpress SARS-CoV-2/FLU/RSV testing.  Fact Sheet for Patients: BloggerCourse.com  Fact Sheet for Healthcare Providers: SeriousBroker.it  This test is not yet approved or cleared by the [place]  FDA and has been authorized for detection and/or diagnosis of SARS-CoV-2 by FDA under an Emergency Use Authorization (EUA). This EUA will remain in effect (meaning this test can be used) for the duration of the COVID-19 declaration under Section 564(b)(1) of the Act, 21 U.S.C. section 360bbb-3(b)(1), unless the authorization is terminated or revoked.     Resp Syncytial Virus by PCR NEGATIVE NEGATIVE Final    Comment: (NOTE) Fact Sheet for Patients: BloggerCourse.com  Fact Sheet for Healthcare Providers: SeriousBroker.it  This test is not yet approved or cleared by the [place]  FDA and has been authorized for detection and/or diagnosis of SARS-CoV-2 by FDA under an Emergency Use Authorization (EUA). This EUA will remain in effect (meaning this test can be used) for the duration of the COVID-19 declaration under Section 564(b)(1) of the Act, 21 U.S.C. section 360bbb-3(b)(1), unless the  authorization is terminated or revoked.  Performed at [facility], [address], [place], [place] [address]     Radiology Reports ECHOCARDIOGRAM COMPLETE Result Date: 11/25/2023    ECHOCARDIOGRAM REPORT   Patient Name:   Chelsey Santiago Date of Exam: 11/25/2023 Medical Rec #:  [id number]       Height:       66.0 in Accession #:    [id number]      Weight:       175.0 lb Date of Birth:  1976-02-27       BSA:          1.890 m Patient Age:    48 years        BP:           160/110 mmHg Patient Gender: F               HR:           67 bpm. Exam Location:  Inpatient Procedure: 2D Echo, 3D Echo, Cardiac Doppler, Color Doppler and Strain Analysis            (Both Spectral and Color Flow Doppler were utilized during            procedure). Indications:    CHF- Acute Systolic I50.21  History:        Patient has prior history of Echocardiogram examinations, most                 recent 12/15/2022. CHF; Risk Factors:Sleep Apnea, Dyslipidemia,                 Hypertension and Diabetes.  Sonographer:    Koleen Popper RDCS Referring Phys: [phone] PRATIK  [facility]  Sonographer Comments: Global longitudinal strain was attempted. IMPRESSIONS  1. Left ventricular ejection fraction, by estimation, is 30 to 35%. Left ventricular ejection fraction by 3D volume is 35 %. The left ventricle has moderately decreased function. The left ventricle demonstrates global hypokinesis. There is moderate concentric left ventricular hypertrophy. Left ventricular diastolic parameters are indeterminate. The average left ventricular global longitudinal strain is -12.5 %. The global longitudinal strain is abnormal.  2. Right ventricular systolic function is moderately reduced. The right ventricular size is normal. There is normal pulmonary artery systolic pressure. The estimated right ventricular systolic pressure is 29.0 mmHg.  3. Left atrial size was mildly dilated.  4. Right atrial size was mildly dilated.  5. A small pericardial effusion is present.  The pericardial effusion is posterior to the left ventricle.  6. The mitral valve is grossly normal. Mild mitral valve regurgitation.  7. The aortic valve is tricuspid. There is mild calcification of the aortic valve. Aortic valve regurgitation is not visualized. Aortic valve sclerosis/calcification is present, without any evidence of aortic stenosis. Aortic valve mean gradient measures 10.3 mmHg.  8. The inferior vena cava is dilated in size with <50% respiratory variability, suggesting right atrial pressure of 15 mmHg. Comparison(s): Prior images reviewed side by side. Stable LVEF in range of 30-35% with moderate LVH, moderately decreased RV contraction. Mild mitral regurgitation. Sclerotic aortic valve without stenosis. FINDINGS  Left Ventricle: Left ventricular ejection fraction, by estimation, is 30 to 35%. Left ventricular ejection fraction by 3D volume is 35 %. The left ventricle has moderately decreased function. The left ventricle demonstrates global hypokinesis. The average left ventricular global longitudinal strain is -12.5 %. Strain was performed and the global longitudinal strain is abnormal. The left ventricular internal cavity size was normal in size. There is moderate concentric left ventricular hypertrophy. Left ventricular diastolic parameters are indeterminate. Right Ventricle: The right ventricular size is normal. No increase in right ventricular wall thickness. Right ventricular systolic function is moderately reduced. There is normal pulmonary artery systolic pressure. The tricuspid regurgitant velocity is 1.87 m/s, and with an assumed right atrial pressure of 15 mmHg, the estimated right ventricular systolic pressure is 29.0 mmHg. Left Atrium: Left atrial size was mildly dilated. Right Atrium: Right atrial size was mildly dilated. Pericardium: A small pericardial effusion is present. The pericardial effusion is posterior to the left ventricle. Mitral Valve: The mitral valve is grossly normal.  Mild mitral valve regurgitation. Tricuspid Valve: The tricuspid valve is grossly normal. Tricuspid valve regurgitation is mild. Aortic Valve: The aortic valve is tricuspid. There is mild calcification of the aortic valve. Aortic valve regurgitation is not visualized. Aortic valve sclerosis/calcification is present, without any evidence of aortic stenosis. Aortic valve mean gradient measures 10.3 mmHg. Aortic valve peak gradient measures 21.0 mmHg. Aortic valve area, by VTI measures 1.31 cm. Pulmonic Valve: The pulmonic valve was grossly normal. Pulmonic valve regurgitation is trivial. Aorta: The aortic root and ascending aorta are structurally normal, with no evidence of dilitation. Venous: The inferior vena cava is dilated in size with less than 50% respiratory variability, suggesting right atrial pressure of 15 mmHg. IAS/Shunts: No atrial level shunt detected by color flow Doppler. Additional Comments: 3D was performed not requiring image post processing on an independent workstation and was abnormal.  LEFT VENTRICLE PLAX 2D LVIDd:         5.20 cm         Diastology LVIDs:         4.30 cm  LV e' medial:    2.31 cm/s LV PW:         1.60 cm         LV E/e' medial:  37.8 LV IVS:        1.60 cm         LV e' lateral:   3.12 cm/s LVOT diam:     1.90 cm         LV E/e' lateral: 28.0 LV SV:         56 LV SV Index:   29              2D Longitudinal LVOT Area:     2.84 cm        Strain                                2D Strain GLS   -12.5 %                                Avg:                                 3D Volume EF                                LV 3D EF:    Left                                             ventricul                                             ar                                             ejection                                             fraction                                             by 3D                                             volume is                                              35 %.  3D Volume EF:                                3D EF:        35 %                                LV EDV:       252 ml                                LV ESV:       163 ml                                LV SV:        89 ml RIGHT VENTRICLE            IVC RV Basal diam:  4.50 cm    IVC diam: 3.20 cm RV Mid diam:    3.50 cm RV S prime:     8.93 cm/s TAPSE (M-mode): 2.0 cm LEFT ATRIUM             Index        RIGHT ATRIUM           Index LA diam:        4.20 cm 2.22 cm/m   RA Area:     18.40 cm LA Vol (A2C):   71.9 ml 38.05 ml/m  RA Volume:   49.80 ml  26.35 ml/m LA Vol (A4C):   64.8 ml 34.29 ml/m LA Biplane Vol: 68.6 ml 36.30 ml/m  AORTIC VALVE AV Area (Vmax):    1.35 cm AV Area (Vmean):   1.26 cm AV Area (VTI):     1.31 cm AV Vmax:           229.33 cm/s AV Vmean:          146.000 cm/s AV VTI:            0.424 m AV Peak Grad:      21.0 mmHg AV Mean Grad:      10.3 mmHg LVOT Vmax:         109.00 cm/s LVOT Vmean:        65.100 cm/s LVOT VTI:          0.196 m LVOT/AV VTI ratio: 0.46  AORTA Ao Root diam: 2.90 cm Ao Asc diam:  3.20 cm MITRAL VALVE               TRICUSPID VALVE MV Area (PHT): 3.42 cm    TR Peak grad:   14.0 mmHg MV Decel Time: 222 msec    TR Vmax:        187.00 cm/s MV E velocity: 87.30 cm/s MV A velocity: 50.70 cm/s  SHUNTS MV E/A ratio:  1.72        Systemic VTI:  0.20 m                            Systemic Diam: 1.90 cm Jayson Sierras MD Electronically signed by Jayson Sierras MD Signature Date/Time: 11/25/2023/12:54:57 PM    Final     SIGNED: Adriana DELENA Grams, MD, FHM. FAAFP. [facility] Time spent -  55 min.  In seeing, evaluating and examining the patient. Reviewing medical records, labs, drawn plan of care. [facility],  Pager (please use [email or web address] to page/ text) Please use Epic Secure Chat for non-urgent communication (7AM-7PM)  If 7PM-7AM, please contact night-coverage [email or web address], 11/25/2023, 1:37 PM

## 2023-11-25 NOTE — Progress Notes (Signed)
 Progress Note  Patient Name: Chelsey Santiago Date of Encounter: 11/25/2023  Primary Cardiologist: Alvan Carrier, MD  Subjective   No acute events overnight.  Serum potassium is 4 today.  She is doing good.  No more vomitings.  Inpatient Medications    Scheduled Meds:  aspirin  EC  81 mg Oral Daily   carvedilol   25 mg Oral BID WC   dapagliflozin  propanediol  10 mg Oral QAC breakfast   fluticasone  furoate-vilanterol  1 puff Inhalation Daily   heparin   5,000 Units Subcutaneous Q8H   hydrALAZINE   100 mg Oral TID   insulin  aspart  0-5 Units Subcutaneous QHS   insulin  aspart  0-9 Units Subcutaneous TID WC   isosorbide  mononitrate  60 mg Oral Daily   lamoTRIgine   100 mg Oral Daily   pantoprazole   40 mg Oral Daily   potassium chloride  SA  40 mEq Oral BID   sacubitril -valsartan   1 tablet Oral BID   senna-docusate  1 tablet Oral BID   sodium chloride  flush  3 mL Intravenous Q12H   spironolactone   25 mg Oral Daily   traZODone   150 mg Oral QHS   Continuous Infusions:  sodium chloride      PRN Meds: sodium chloride , acetaminophen , albuterol , clonazePAM , cyclobenzaprine , guaiFENesin -dextromethorphan , hydrALAZINE , nitroGLYCERIN , sodium chloride  flush   Vital Signs    Vitals:   11/24/23 2034 11/25/23 0500 11/25/23 0528 11/25/23 0858  BP: (!) 128/93  124/69 (!) 160/110  Pulse: 69  74 72  Resp: 20     Temp: 98.8 F (37.1 C)  98.2 F (36.8 C) 97.7 F (36.5 C)  TempSrc: Oral  Oral Oral  SpO2: 99%  99% 97%  Weight: 79.5 kg 79.4 kg    Height:        Intake/Output Summary (Last 24 hours) at 11/25/2023 0942 Last data filed at 11/25/2023 0531 Gross per 24 hour  Intake 1486.45 ml  Output --  Net 1486.45 ml   Filed Weights   11/24/23 1500 11/24/23 2034 11/25/23 0500  Weight: 92.6 kg 79.5 kg 79.4 kg    Telemetry     Personally reviewed.  NSR.  ECG    Not performed today.  Physical Exam   GEN: No acute distress.   Neck: No JVD. Cardiac: RRR, no murmur, rub, or  gallop.  Respiratory: Nonlabored. Clear to auscultation bilaterally. GI: Soft, nontender, bowel sounds present. MS: No edema; No deformity. Neuro:  Nonfocal. Psych: Alert and oriented x 3. Normal affect.  Labs    Chemistry Recent Labs  Lab 11/24/23 0946 11/25/23 0416  NA 140 142  K 2.3* 4.0  CL 95* 104  CO2 32 29  GLUCOSE 96 89  BUN 18 19  CREATININE 1.33* 1.29*  CALCIUM  8.5* 8.5*  PROT 7.4  --   ALBUMIN 3.7  --   AST 74*  --   ALT 64*  --   ALKPHOS 100  --   BILITOT 1.0  --   GFRNONAA 49* 51*  ANIONGAP 13 9     Hematology Recent Labs  Lab 11/24/23 0946  WBC 4.0  RBC 4.57  HGB 10.6*  HCT 33.5*  MCV 73.3*  MCH 23.2*  MCHC 31.6  RDW 14.7  PLT 210    Cardiac Enzymes Recent Labs  Lab 11/24/23 0946 11/24/23 1056  TROPONINIHS 52* 52*    BNP Recent Labs  Lab 11/24/23 0946  BNP 1,848.0*     DDimerNo results for input(s): DDIMER in the last 168 hours.   Radiology  DG Chest 2 View Result Date: 11/24/2023 CLINICAL DATA:  Shortness of breath. EXAM: CHEST - 2 VIEW COMPARISON:  12/03/2022. FINDINGS: Stable cardiomegaly. Mediastinal contours are within normal limits. Central pulmonary vascular congestion. No focal consolidation, pleural effusion, or pneumothorax. No acute osseous abnormality. IMPRESSION: Cardiomegaly with central pulmonary vascular congestion. Electronically Signed   By: Harrietta Sherry M.D.   On: 11/24/2023 09:36    Assessment & Plan    Acute on chronic systolic and diastolic heart failure: Presented with DOE, orthopnea, PND, productive cough at night and leg swelling b/l x few days.  She has been noncompliant to fluid restriction due to hot weather and needing to stay hydrated. BNP 1848.  Chest x-ray showed cardiomegaly and pulmonary vascular congestion.  Serum creatinine 1.33.  IV Lasix  was not started yesterday due to severe hypokalemia, k 2.3.  Repeat K this morning is 4.  Start IV Lasix  80 mg twice daily.  Continue home GDMT,  carvedilol  25 mg twice daily, Entresto  97-103 mg twice daily, Farxiga  10 mg once daily, Imdur  60 mg once daily, spironolactone  25 mg once daily.  Patient not taking hydralazine  but despite starting hydralazine , her BP continues to be elevated.   Severe hypokalemia: K 2.3, resolved: Electrolyte repleted.  Keep K>4 and <5, Mg>2 and <3.  No more vomitings.  Severe hypokalemia likely secondary to vomiting.   HTN, poorly controlled: Continue above medications in addition to IV Lasix .  Hopefully blood pressure comes down.   Mild aortic valve stenosis in 2024: Outpatient surveillance with serial echocardiograms.  Signed, Diannah SHAUNNA Maywood, MD  11/25/2023, 9:42 AM

## 2023-11-25 NOTE — Plan of Care (Signed)
  Problem: Education: Goal: Knowledge of General Education information will improve Description: Including pain rating scale, medication(s)/side effects and non-pharmacologic comfort measures Outcome: Progressing   Problem: Clinical Measurements: Goal: Respiratory complications will improve Outcome: Progressing   Problem: Nutrition: Goal: Adequate nutrition will be maintained Outcome: Progressing   Problem: Coping: Goal: Level of anxiety will decrease Outcome: Progressing   Problem: Pain Managment: Goal: General experience of comfort will improve and/or be controlled Outcome: Progressing

## 2023-11-25 NOTE — Hospital Course (Addendum)
 Chelsey Santiago is a 48 y.o. female with medical history significant for chronic systolic CHF with LVEF 30-35%, hypertension, OSA with nighttime oxygen , mild aortic valve stenosis, type 2 diabetes, depression/anxiety, peripheral neuropathy, and obesity with prior gastric sleeve surgery with over 60 pound weight loss.  She states that she has had bilateral lower extremity edema developed for approximately a week ago and she has gained approximately 6 pounds of weight up from 171 pounds to 177 pounds.  She has tried doubling up on her dose of torsemide  in the last 2 days, but without any significant improvement.  She claims to have some orthopnea as well as dyspnea on exertion along with chest pressure.  She has a mild cough when she lays flat as well.  Patient does state that she has had some nausea and vomiting in the last 1 day with decreased oral intake overall since her gastric surgery.  She denies any diarrhea.  She does continue to take her home potassium supplementation.   ED Course: Vital signs with some initial tachypnea and patient currently on 2 L nasal cannula.  Potassium 2.3 and magnesium  1.4.  Hemoglobin 10.6 and creatinine 1.33.  Mild elevation in AST and ALT at 74 and 64 respectively.  BNP 1848.  EKG with no significant abnormalities.  Chest x-ray with cardiomegaly and pulmonary vascular congestion.  Patient started on electrolyte supplementation IV in the ED.      Assessment & Plan:   Principal Problem:   Acute on chronic combined systolic and diastolic ACC/AHA stage C congestive heart failure (HCC) Active Problems:   Troponin level elevated   Chronic anemia   Hypokalemia   OSA on CPAP   Cardiomyopathy due to hypertension (HCC)   Nonischemic cardiomyopathy (HCC)   Depression   Neuropathy   Hypomagnesemia   Uncontrolled type 2 diabetes mellitus with hyperglycemia (HCC)   Gastroesophageal reflux disease   Chronic hypertension   Hypokalemia due to excessive gastrointestinal loss  of potassium   Acute on chronic combined systolic and diastolic CHF (congestive heart failure) (HCC)   Acute on chronic systolic CHF exacerbation -POA bilateral lower extremity edema, orthopnea, elevated BNP 1848 >>> 1083  - Chest x-ray.  For cardiomegaly, pulmonary congestion -Volume overload - Resuming Lasix  80 mg IV twice daily -Continue home GDMT: Carvedilol  25 twice daily, Entresto  97/103 mg twice daily, Farxiga  10 mg once daily, Imdur  60 mg once daily, spironolactone  25 mg once daily.  -  - Repeat 2D echocardiogram  Echo:w prior LVEF 30-35% and global hypokinesis 11/2022 - Monitor strict I's and O's and daily weights, Reds clip  - Appreciate cardiology evaluation while hospitalized,  (follows with Dr. Rolan heart failure team)  - I's and O's inaccurate (no PureWick or Foley catheter in place) Filed Weights   11/24/23 2034 11/25/23 0500 11/26/23 0300  Weight: 79.5 kg 79.4 kg 78.9 kg    Intake/Output Summary (Last 24 hours) at 11/26/2023 1200 Last data filed at 11/26/2023 9045 Gross per 24 hour  Intake 2280 ml  Output 2500 ml  Net -220 ml    Elevated troponin - Denies any chest pain no acute EKG changes -  -Continue current meds, monitor   Severe hypokalemia/hypomagnesemia - POA K2.3 >>> 4.0 Keeping K > mag. >2  - Improve oral intake, improved nausea vomiting - Okay to keep spironolactone    Hypertension - Blood pressure on lower side, 118/72 - Reducing dose of spironolactone  - Holding hydralazine     Type 2 diabetes - Monitor CBG, carb modified diet  with SSI   Depression/anxiety - Continue home medications: Lamictal ,   Peripheral neuropathy - Continue gabapentin    History of obesity with prior gastric sleeve surgery - Noted loss of 60 pounds   OSA - Wears oxygen  at night

## 2023-11-26 DIAGNOSIS — I5043 Acute on chronic combined systolic (congestive) and diastolic (congestive) heart failure: Secondary | ICD-10-CM | POA: Diagnosis not present

## 2023-11-26 LAB — BASIC METABOLIC PANEL WITH GFR
Anion gap: 12 (ref 5–15)
BUN: 18 mg/dL (ref 6–20)
CO2: 32 mmol/L (ref 22–32)
Calcium: 9.1 mg/dL (ref 8.9–10.3)
Chloride: 96 mmol/L — ABNORMAL LOW (ref 98–111)
Creatinine, Ser: 1.4 mg/dL — ABNORMAL HIGH (ref 0.44–1.00)
GFR, Estimated: 46 mL/min — ABNORMAL LOW (ref >60)
Glucose, Bld: 93 mg/dL (ref 70–99)
Potassium: 4.6 mmol/L (ref 3.5–5.1)
Sodium: 140 mmol/L (ref 135–145)

## 2023-11-26 LAB — GLUCOSE, CAPILLARY
Glucose-Capillary: 130 mg/dL — ABNORMAL HIGH (ref 70–99)
Glucose-Capillary: 101 mg/dL — ABNORMAL HIGH (ref 70–99)
Glucose-Capillary: 140 mg/dL — ABNORMAL HIGH (ref 70–99)
Glucose-Capillary: 125 mg/dL — ABNORMAL HIGH (ref 70–99)

## 2023-11-26 LAB — HEMOGLOBIN A1C
Hgb A1c MFr Bld: 6 % — ABNORMAL HIGH (ref 4.8–5.6)
Mean Plasma Glucose: 126 mg/dL

## 2023-11-26 MED ORDER — POTASSIUM CHLORIDE CRYS ER 20 MEQ PO TBCR
40.0000 meq | EXTENDED_RELEASE_TABLET | Freq: Every day | ORAL | Status: DC
  Administered 2023-11-27: 40 meq via ORAL
  Filled 2023-11-26: qty 2

## 2023-11-26 MED ORDER — ISOSORBIDE MONONITRATE ER 30 MG PO TB24
30.0000 mg | ORAL_TABLET | Freq: Every day | ORAL | Status: DC
  Administered 2023-11-26 – 2023-11-27 (×2): 30 mg via ORAL
  Filled 2023-11-26: qty 1

## 2023-11-26 MED ORDER — HYDRALAZINE HCL 50 MG PO TABS: 100.0000 mg | ORAL_TABLET | Freq: Three times a day (TID) | ORAL | Status: DC

## 2023-11-26 MED ORDER — SPIRONOLACTONE 12.5 MG HALF TABLET
12.5000 mg | ORAL_TABLET | Freq: Every day | ORAL | Status: DC
Start: 2023-11-26 — End: 2023-11-27
  Administered 2023-11-26 – 2023-11-27 (×2): 12.5 mg via ORAL
  Filled 2023-11-26 (×2): qty 1

## 2023-11-26 NOTE — Progress Notes (Signed)
 PROGRESS NOTE    Patient: Chelsey Santiago                            PCP: Leavy Waddell NOVAK, FNP                    DOB: 07-Feb-1976            DOA: 11/24/2023 FMW:[id number]             DOS: 11/26/2023, 12:03 PM   LOS: 1 day   Date of Service: The patient was seen and examined on 11/26/2023  Subjective:   The patient was seen and examined this morning, stable no acute distress, blood pressure 118/72, satting 98% on room air Diuresing well  Brief Narrative:   Chelsey Santiago is a 48 y.o. female with medical history significant for chronic systolic CHF with LVEF 30-35%, hypertension, OSA with nighttime oxygen , mild aortic valve stenosis, type 2 diabetes, depression/anxiety, peripheral neuropathy, and obesity with prior gastric sleeve surgery with over 60 pound weight loss.  She states that she has had bilateral lower extremity edema developed for approximately a week ago and she has gained approximately 6 pounds of weight up from 171 pounds to 177 pounds.  She has tried doubling up on her dose of torsemide  in the last 2 days, but without any significant improvement.  She claims to have some orthopnea as well as dyspnea on exertion along with chest pressure.  She has a mild cough when she lays flat as well.  Patient does state that she has had some nausea and vomiting in the last 1 day with decreased oral intake overall since her gastric surgery.  She denies any diarrhea.  She does continue to take her home potassium supplementation.   ED Course: Vital signs with some initial tachypnea and patient currently on 2 L nasal cannula.  Potassium 2.3 and magnesium  1.4.  Hemoglobin 10.6 and creatinine 1.33.  Mild elevation in AST and ALT at 74 and 64 respectively.  BNP 1848.  EKG with no significant abnormalities.  Chest x-ray with cardiomegaly and pulmonary vascular congestion.  Patient started on electrolyte supplementation IV in the ED.      Assessment & Plan:   Principal Problem:   Acute on  chronic combined systolic and diastolic ACC/AHA stage C congestive heart failure (HCC) Active Problems:   Troponin level elevated   Chronic anemia   Hypokalemia   OSA on CPAP   Cardiomyopathy due to hypertension (HCC)   Nonischemic cardiomyopathy (HCC)   Depression   Neuropathy   Hypomagnesemia   Uncontrolled type 2 diabetes mellitus with hyperglycemia (HCC)   Gastroesophageal reflux disease   Chronic hypertension   Hypokalemia due to excessive gastrointestinal loss of potassium   Acute on chronic combined systolic and diastolic CHF (congestive heart failure) (HCC)   Acute on chronic systolic CHF exacerbation -POA bilateral lower extremity edema, orthopnea, elevated BNP 1848 >>> 1083  - Chest x-ray.  For cardiomegaly, pulmonary congestion -Volume overload - Resuming Lasix  80 mg IV twice daily -Continue home GDMT: Carvedilol  25 twice daily, Entresto  97/103 mg twice daily, Farxiga  10 mg once daily, Imdur  60 mg once daily, spironolactone  25 mg once daily.  -  - Repeat 2D echocardiogram  Echo:w prior LVEF 30-35% and global hypokinesis 11/2022 - Monitor strict I's and O's and daily weights, Reds clip  - Appreciate cardiology evaluation while hospitalized,  (follows with Dr. Rolan heart failure team)  -  I's and O's inaccurate (no PureWick or Foley catheter in place) Filed Weights   11/24/23 2034 11/25/23 0500 11/26/23 0300  Weight: 79.5 kg 79.4 kg 78.9 kg    Intake/Output Summary (Last 24 hours) at 11/26/2023 1200 Last data filed at 11/26/2023 9045 Gross per 24 hour  Intake 2280 ml  Output 2500 ml  Net -220 ml    Elevated troponin - Denies any chest pain no acute EKG changes -  -Continue current meds, monitor   Severe hypokalemia/hypomagnesemia - POA K2.3 >>> 4.0 Keeping K > mag. >2  - Improve oral intake, improved nausea vomiting - Okay to keep spironolactone    Hypertension - Blood pressure on lower side, 118/72 - Reducing dose of spironolactone  - Holding  hydralazine     Type 2 diabetes - Monitor CBG, carb modified diet with SSI   Depression/anxiety - Continue home medications: Lamictal ,   Peripheral neuropathy - Continue gabapentin    History of obesity with prior gastric sleeve surgery - Noted loss of 60 pounds   OSA - Wears oxygen  at night    ------------------------------------------------------------------------------------------------------------------- Nutritional status:  The patient's BMI is: Body mass index is 28.08 kg/m. I agree with the assessment and plan as outlined  Consult: Cardiology  -------------------------------------------------------------------------------------------------------------------  DVT prophylaxis:  heparin  injection 5,000 Units Start: 11/24/23 2200   Code Status:   Code Status: Full Code  Family Communication: No family member present at bedside--Advance care planning has been discussed.   Admission status:   Status is: Observation The patient remains OBS appropriate and will d/c before 2 midnights.   Disposition: From  - home             Planning for discharge in 1-2 days: to   Procedures:   No admission procedures for hospital encounter.   Antimicrobials:  Anti-infectives (From admission, onward)    None        Medication:   aspirin  EC  81 mg Oral Daily   carvedilol   25 mg Oral BID WC   Chlorhexidine  Gluconate Cloth  6 each Topical Daily   dapagliflozin  propanediol  10 mg Oral QAC breakfast   fluticasone  furoate-vilanterol  1 puff Inhalation Daily   furosemide   80 mg Intravenous BID   heparin   5,000 Units Subcutaneous Q8H   [START ON 11/28/2023] hydrALAZINE   100 mg Oral TID   insulin  aspart  0-5 Units Subcutaneous QHS   insulin  aspart  0-9 Units Subcutaneous TID WC   isosorbide  mononitrate  30 mg Oral Daily   lamoTRIgine   100 mg Oral Daily   mupirocin  ointment   Nasal BID   pantoprazole   40 mg Oral Daily   potassium chloride  SA  40 mEq Oral BID    sacubitril -valsartan   1 tablet Oral BID   senna-docusate  1 tablet Oral BID   spironolactone   12.5 mg Oral Daily   traZODone   150 mg Oral QHS    acetaminophen , albuterol , clonazePAM , cyclobenzaprine , guaiFENesin -dextromethorphan , hydrALAZINE , nitroGLYCERIN    Objective:   Vitals:   11/25/23 1506 11/25/23 2029 11/26/23 0300 11/26/23 0726  BP: 112/69 130/89 118/72   Pulse: 73 74 73   Resp:  18    Temp: 97.6 F (36.4 C) 98.2 F (36.8 C) 97.9 F (36.6 C)   TempSrc: Oral     SpO2: 98% 97% 99% 98%  Weight:   78.9 kg   Height:        Intake/Output Summary (Last 24 hours) at 11/26/2023 1203 Last data filed at 11/26/2023 0954 Gross per 24 hour  Intake  2280 ml  Output 2500 ml  Net -220 ml   Filed Weights   11/24/23 2034 11/25/23 0500 11/26/23 0300  Weight: 79.5 kg 79.4 kg 78.9 kg     Physical examination:        General:  AAO x 3,  cooperative, no distress;   HEENT:  Normocephalic, PERRL, otherwise with in Normal limits   Neuro:  CNII-XII intact. , normal motor and sensation, reflexes intact   Lungs:   Clear to auscultation BL, Respirations unlabored,  No wheezes / crackles  Cardio:    S1/S2, RRR, No murmure, No Rubs or Gallops   Abdomen:  Soft, non-tender, bowel sounds active all four quadrants, no guarding or peritoneal signs.  Muscular  skeletal:  Limited exam -global generalized weaknesses - in bed, able to move all 4 extremities,   2+ pulses,  symmetric, +2  pitting edema  Skin:  Dry, warm to touch, negative for any Rashes,  Wounds: Please see nursing documentation          ------------------------------------------------------------------------------------------------------------------------------------------    LABs:     Latest Ref Rng & Units 11/24/2023    9:46 AM 11/16/2022    9:07 AM 10/28/2022    3:32 AM  CBC  WBC 4.0 - 10.5 K/uL 4.0  8.4  8.5   Hemoglobin 12.0 - 15.0 g/dL 89.3  89.4  87.7   Hematocrit 36.0 - 46.0 % 33.5  33.2  39.2   Platelets  150 - 400 K/uL 210  189  212       Latest Ref Rng & Units 11/25/2023    4:16 AM 11/24/2023    9:46 AM 04/29/2023    3:11 PM  CMP  Glucose 70 - 99 mg/dL 89  96  831   BUN 6 - 20 mg/dL 19  18  20    Creatinine 0.44 - 1.00 mg/dL 8.70  8.66  8.70   Sodium 135 - 145 mmol/L 142  140  140   Potassium 3.5 - 5.1 mmol/L 4.0  2.3  3.4   Chloride 98 - 111 mmol/L 104  95  101   CO2 22 - 32 mmol/L 29  32  30   Calcium  8.9 - 10.3 mg/dL 8.5  8.5  8.8   Total Protein 6.5 - 8.1 g/dL  7.4    Total Bilirubin 0.0 - 1.2 mg/dL  1.0    Alkaline Phos 38 - 126 U/L  100    AST 15 - 41 U/L  74    ALT 0 - 44 U/L  64         Micro Results Recent Results (from the past 240 hours)  Resp panel by RT-PCR (RSV, Flu A&B, Covid) Anterior Nasal Swab     Status: None   Collection Time: 11/24/23 10:15 AM   Specimen: Anterior Nasal Swab  Result Value Ref Range Status   SARS Coronavirus 2 by RT PCR NEGATIVE NEGATIVE Final    Comment: (NOTE) SARS-CoV-2 target nucleic acids are NOT DETECTED.  The SARS-CoV-2 RNA is generally detectable in upper respiratory specimens during the acute phase of infection. The lowest concentration of SARS-CoV-2 viral copies this assay can detect is 138 copies/mL. A negative result does not preclude SARS-Cov-2 infection and should not be used as the sole basis for treatment or other patient management decisions. A negative result may occur with  improper specimen collection/handling, submission of specimen other than nasopharyngeal swab, presence of viral mutation(s) within the areas targeted by this assay, and inadequate number  of viral copies(<138 copies/mL). A negative result must be combined with clinical observations, patient history, and epidemiological information. The expected result is Negative.  Fact Sheet for Patients:  BloggerCourse.com  Fact Sheet for Healthcare Providers:  SeriousBroker.it  This test is no t yet  approved or cleared by the [place]  FDA and  has been authorized for detection and/or diagnosis of SARS-CoV-2 by FDA under an Emergency Use Authorization (EUA). This EUA will remain  in effect (meaning this test can be used) for the duration of the COVID-19 declaration under Section 564(b)(1) of the Act, 21 U.S.C.section 360bbb-3(b)(1), unless the authorization is terminated  or revoked sooner.       Influenza A by PCR NEGATIVE NEGATIVE Final   Influenza B by PCR NEGATIVE NEGATIVE Final    Comment: (NOTE) The Xpert Xpress SARS-CoV-2/FLU/RSV plus assay is intended as an aid in the diagnosis of influenza from Nasopharyngeal swab specimens and should not be used as a sole basis for treatment. Nasal washings and aspirates are unacceptable for Xpert Xpress SARS-CoV-2/FLU/RSV testing.  Fact Sheet for Patients: BloggerCourse.com  Fact Sheet for Healthcare Providers: SeriousBroker.it  This test is not yet approved or cleared by the [place]  FDA and has been authorized for detection and/or diagnosis of SARS-CoV-2 by FDA under an Emergency Use Authorization (EUA). This EUA will remain in effect (meaning this test can be used) for the duration of the COVID-19 declaration under Section 564(b)(1) of the Act, 21 U.S.C. section 360bbb-3(b)(1), unless the authorization is terminated or revoked.     Resp Syncytial Virus by PCR NEGATIVE NEGATIVE Final    Comment: (NOTE) Fact Sheet for Patients: BloggerCourse.com  Fact Sheet for Healthcare Providers: SeriousBroker.it  This test is not yet approved or cleared by the [place]  FDA and has been authorized for detection and/or diagnosis of SARS-CoV-2 by FDA under an Emergency Use Authorization (EUA). This EUA will remain in effect (meaning this test can be used) for the duration of the COVID-19 declaration under Section 564(b)(1) of  the Act, 21 U.S.C. section 360bbb-3(b)(1), unless the authorization is terminated or revoked.  Performed at [facility], [address], [place], [place] [address]   MRSA Next Gen by PCR, Nasal     Status: Abnormal   Collection Time: 11/25/23 10:36 AM   Specimen: Nasal Mucosa; Nasal Swab  Result Value Ref Range Status   MRSA by PCR Next Gen POSITIVE (A) NOT DETECTED Final    Comment:        The GeneXpert MRSA Assay (FDA approved for NASAL specimens only), is one component of a comprehensive MRSA colonization surveillance program. It is not intended to diagnose MRSA infection nor to guide or monitor treatment for MRSA infections. RESULT CALLED TO, READ BACK BY AND VERIFIED WITH: HUI,BECK ON 11/25/2023 @9 :54PM BY T.HAMER  Performed at [facility], [address], [place], [place] [address]     Radiology Reports ECHOCARDIOGRAM COMPLETE Result Date: 11/25/2023    ECHOCARDIOGRAM REPORT   Patient Name:   Chelsey Santiago Date of Exam: 11/25/2023 Medical Rec #:  [id number]       Height:       66.0 in Accession #:    [id number]      Weight:       175.0 lb Date of Birth:  12-01-1975       BSA:          1.890 m Patient Age:    48 years        BP:  160/110 mmHg Patient Gender: F               HR:           67 bpm. Exam Location:  Inpatient Procedure: 2D Echo, 3D Echo, Cardiac Doppler, Color Doppler and Strain Analysis            (Both Spectral and Color Flow Doppler were utilized during            procedure). Indications:    CHF- Acute Systolic I50.21  History:        Patient has prior history of Echocardiogram examinations, most                 recent 12/15/2022. CHF; Risk Factors:Sleep Apnea, Dyslipidemia,                 Hypertension and Diabetes.  Sonographer:    Koleen Popper RDCS Referring Phys: [id number] PRATIK D [facility]  Sonographer Comments: Global longitudinal strain was attempted. IMPRESSIONS  1. Left ventricular ejection fraction, by estimation, is 30 to 35%. Left ventricular ejection  fraction by 3D volume is 35 %. The left ventricle has moderately decreased function. The left ventricle demonstrates global hypokinesis. There is moderate concentric left ventricular hypertrophy. Left ventricular diastolic parameters are indeterminate. The average left ventricular global longitudinal strain is -12.5 %. The global longitudinal strain is abnormal.  2. Right ventricular systolic function is moderately reduced. The right ventricular size is normal. There is normal pulmonary artery systolic pressure. The estimated right ventricular systolic pressure is 29.0 mmHg.  3. Left atrial size was mildly dilated.  4. Right atrial size was mildly dilated.  5. A small pericardial effusion is present. The pericardial effusion is posterior to the left ventricle.  6. The mitral valve is grossly normal. Mild mitral valve regurgitation.  7. The aortic valve is tricuspid. There is mild calcification of the aortic valve. Aortic valve regurgitation is not visualized. Aortic valve sclerosis/calcification is present, without any evidence of aortic stenosis. Aortic valve mean gradient measures 10.3 mmHg.  8. The inferior vena cava is dilated in size with <50% respiratory variability, suggesting right atrial pressure of 15 mmHg. Comparison(s): Prior images reviewed side by side. Stable LVEF in range of 30-35% with moderate LVH, moderately decreased RV contraction. Mild mitral regurgitation. Sclerotic aortic valve without stenosis. FINDINGS  Left Ventricle: Left ventricular ejection fraction, by estimation, is 30 to 35%. Left ventricular ejection fraction by 3D volume is 35 %. The left ventricle has moderately decreased function. The left ventricle demonstrates global hypokinesis. The average left ventricular global longitudinal strain is -12.5 %. Strain was performed and the global longitudinal strain is abnormal. The left ventricular internal cavity size was normal in size. There is moderate concentric left ventricular  hypertrophy. Left ventricular diastolic parameters are indeterminate. Right Ventricle: The right ventricular size is normal. No increase in right ventricular wall thickness. Right ventricular systolic function is moderately reduced. There is normal pulmonary artery systolic pressure. The tricuspid regurgitant velocity is 1.87 m/s, and with an assumed right atrial pressure of 15 mmHg, the estimated right ventricular systolic pressure is 29.0 mmHg. Left Atrium: Left atrial size was mildly dilated. Right Atrium: Right atrial size was mildly dilated. Pericardium: A small pericardial effusion is present. The pericardial effusion is posterior to the left ventricle. Mitral Valve: The mitral valve is grossly normal. Mild mitral valve regurgitation. Tricuspid Valve: The tricuspid valve is grossly normal. Tricuspid valve regurgitation is mild. Aortic Valve: The aortic valve is tricuspid. There  is mild calcification of the aortic valve. Aortic valve regurgitation is not visualized. Aortic valve sclerosis/calcification is present, without any evidence of aortic stenosis. Aortic valve mean gradient measures 10.3 mmHg. Aortic valve peak gradient measures 21.0 mmHg. Aortic valve area, by VTI measures 1.31 cm. Pulmonic Valve: The pulmonic valve was grossly normal. Pulmonic valve regurgitation is trivial. Aorta: The aortic root and ascending aorta are structurally normal, with no evidence of dilitation. Venous: The inferior vena cava is dilated in size with less than 50% respiratory variability, suggesting right atrial pressure of 15 mmHg. IAS/Shunts: No atrial level shunt detected by color flow Doppler. Additional Comments: 3D was performed not requiring image post processing on an independent workstation and was abnormal.  LEFT VENTRICLE PLAX 2D LVIDd:         5.20 cm         Diastology LVIDs:         4.30 cm         LV e' medial:    2.31 cm/s LV PW:         1.60 cm         LV E/e' medial:  37.8 LV IVS:        1.60 cm         LV  e' lateral:   3.12 cm/s LVOT diam:     1.90 cm         LV E/e' lateral: 28.0 LV SV:         56 LV SV Index:   29              2D Longitudinal LVOT Area:     2.84 cm        Strain                                2D Strain GLS   -12.5 %                                Avg:                                 3D Volume EF                                LV 3D EF:    Left                                             ventricul                                             ar                                             ejection  fraction                                             by 3D                                             volume is                                             35 %.                                 3D Volume EF:                                3D EF:        35 %                                LV EDV:       252 ml                                LV ESV:       163 ml                                LV SV:        89 ml RIGHT VENTRICLE            IVC RV Basal diam:  4.50 cm    IVC diam: 3.20 cm RV Mid diam:    3.50 cm RV S prime:     8.93 cm/s TAPSE (M-mode): 2.0 cm LEFT ATRIUM             Index        RIGHT ATRIUM           Index LA diam:        4.20 cm 2.22 cm/m   RA Area:     18.40 cm LA Vol (A2C):   71.9 ml 38.05 ml/m  RA Volume:   49.80 ml  26.35 ml/m LA Vol (A4C):   64.8 ml 34.29 ml/m LA Biplane Vol: 68.6 ml 36.30 ml/m  AORTIC VALVE AV Area (Vmax):    1.35 cm AV Area (Vmean):   1.26 cm AV Area (VTI):     1.31 cm AV Vmax:           229.33 cm/s AV Vmean:          146.000 cm/s AV VTI:            0.424 m AV Peak Grad:      21.0 mmHg AV Mean Grad:      10.3 mmHg LVOT Vmax:         109.00 cm/s LVOT Vmean:        65.100 cm/s LVOT VTI:  0.196 m LVOT/AV VTI ratio: 0.46  AORTA Ao Root diam: 2.90 cm Ao Asc diam:  3.20 cm MITRAL VALVE               TRICUSPID VALVE MV Area (PHT): 3.42 cm    TR Peak grad:   14.0 mmHg MV Decel Time: 222 msec    TR Vmax:         187.00 cm/s MV E velocity: 87.30 cm/s MV A velocity: 50.70 cm/s  SHUNTS MV E/A ratio:  1.72        Systemic VTI:  0.20 m                            Systemic Diam: 1.90 cm Jayson Sierras MD Electronically signed by Jayson Sierras MD Signature Date/Time: 11/25/2023/12:54:57 PM    Final     SIGNED: Adriana DELENA Grams, MD, FHM. FAAFP. [place] [facility] Time spent - 55 min.  In seeing, evaluating and examining the patient. Reviewing medical records, labs, drawn plan of care. [facility],  Pager (please use [email or web address] to page/ text) Please use Epic Secure Chat for non-urgent communication (7AM-7PM)  If 7PM-7AM, please contact night-coverage [email or web address], 11/26/2023, 12:03 PM

## 2023-11-26 NOTE — Plan of Care (Signed)

## 2023-11-26 NOTE — Plan of Care (Signed)
   Problem: Nutrition: Goal: Adequate nutrition will be maintained Outcome: Adequate for Discharge

## 2023-11-27 DIAGNOSIS — I5043 Acute on chronic combined systolic (congestive) and diastolic (congestive) heart failure: Secondary | ICD-10-CM | POA: Diagnosis not present

## 2023-11-27 LAB — COMPREHENSIVE METABOLIC PANEL WITH GFR
ALT: 36 U/L (ref 0–44)
AST: 32 U/L (ref 15–41)
Albumin: 3.4 g/dL — ABNORMAL LOW (ref 3.5–5.0)
Alkaline Phosphatase: 94 U/L (ref 38–126)
Anion gap: 12 (ref 5–15)
BUN: 23 mg/dL — ABNORMAL HIGH (ref 6–20)
CO2: 32 mmol/L (ref 22–32)
Calcium: 9.1 mg/dL (ref 8.9–10.3)
Chloride: 93 mmol/L — ABNORMAL LOW (ref 98–111)
Creatinine, Ser: 1.41 mg/dL — ABNORMAL HIGH (ref 0.44–1.00)
GFR, Estimated: 46 mL/min — ABNORMAL LOW (ref >60)
Glucose, Bld: 90 mg/dL (ref 70–99)
Potassium: 3.9 mmol/L (ref 3.5–5.1)
Sodium: 137 mmol/L (ref 135–145)
Total Bilirubin: 0.6 mg/dL (ref 0.0–1.2)
Total Protein: 7.3 g/dL (ref 6.5–8.1)

## 2023-11-27 LAB — GLUCOSE, CAPILLARY: Glucose-Capillary: 93 mg/dL (ref 70–99)

## 2023-11-27 MED ORDER — BUDESONIDE-FORMOTEROL FUMARATE 80-4.5 MCG/ACT IN AERO: 2.0000 | INHALATION_SPRAY | Freq: Two times a day (BID) | RESPIRATORY_TRACT | 0 refills | Status: AC

## 2023-11-27 MED ORDER — ISOSORBIDE MONONITRATE ER 30 MG PO TB24: 30.0000 mg | ORAL_TABLET | Freq: Every day | ORAL | 0 refills | Status: DC

## 2023-11-27 NOTE — Discharge Summary (Signed)
 Physician Discharge Summary   Patient: Chelsey Santiago MRN: [id number] DOB: [birth year]  Admit date:     11/24/2023  Discharge date: 11/27/23  Discharge Physician: Adriana DELENA Grams   PCP: Leavy Waddell NOVAK, FNP   Recommendations at discharge:   Follow-up with your cardiologist in 1 week Monitor your weight daily (if increase in weight >3 in 24 hours, or pounds o5 >5 pounds in 5-7 days Or swelling, shortness of breath or any symptoms of fluid overload may take extra dose of torsemide  as instructed. 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 *  Hospital Course: Chelsey Santiago is a 48 y.o. female with medical history significant for chronic systolic CHF with LVEF 30-35%, hypertension, OSA with nighttime oxygen , mild aortic valve stenosis, type 2 diabetes, depression/anxiety, peripheral neuropathy, and obesity with prior gastric sleeve surgery with over 60 pound weight loss.  She states that she has had bilateral lower extremity edema developed for approximately a week ago and she has gained approximately 6 pounds of weight up from 171 pounds to 177 pounds.  She has tried doubling up on her dose of torsemide  in the last 2 days, but without any significant improvement.  She claims to have some orthopnea as well as dyspnea on  exertion along with chest pressure.  She has a mild cough when she lays flat as well.  Patient does state that she has had some nausea and vomiting in the last 1 day with decreased oral intake overall since her gastric surgery.  She denies any diarrhea.  She does continue to take her home potassium supplementation.   ED Course: Vital signs with some initial tachypnea and patient currently on 2 L nasal cannula.  Potassium 2.3 and magnesium  1.4.  Hemoglobin 10.6 and creatinine 1.33.  Mild elevation in AST and ALT at 74 and 64 respectively.  BNP 1848.  EKG with no significant abnormalities.  Chest x-ray with cardiomegaly and pulmonary vascular congestion.  Patient started on electrolyte supplementation IV in the ED.   Acute on chronic systolic CHF exacerbation -POA bilateral lower extremity edema, orthopnea, elevated BNP 1848 >>> 1083 - Much improved shortness of breath, lower extremity edema  - Chest x-ray.  For cardiomegaly, pulmonary congestion -Volume overload -much improved - Resuming Lasix  80 mg IV twice daily--switch back to torsemide  40 mg p.o. twice daily -Continue home GDMT: Carvedilol  25 twice daily, Entresto  97/103 mg twice daily, Farxiga  10 mg once daily, Imdur  60 mg once daily, spironolactone  25 mg once daily.  -  - Repeat 2D echocardiogram  Echo:w prior LVEF 30-35% and global hypokinesis 11/2022 - Monitor strict I's and O's and daily weights, Reds clip  - Appreciate cardiology evaluation while hospitalized,  (follows with Dr. Rolan heart failure team)  - I's and O's inaccurate (no PureWick or Foley catheter in place) [employer]   11/25/23  0500 11/26/23 0300 11/27/23 0602  Weight: 79.4 kg 78.9 kg 75.8 kg      Elevated troponin - Denies any chest pain no acute EKG changes -  -Continue current meds, monitor   Severe hypokalemia/hypomagnesemia - POA K2.3 >>> 4.0, 3.9 Keeping K > mag. >2  - Improve oral intake, improved nausea vomiting - Okay to keep spironolactone     Hypertension - Blood pressure on lower side, - Discontinued hydralazine  - Continue torsemide , spironolactone , Imdur     Type 2 diabetes - Resume home regimen, carb modified diabetic diet   Depression/anxiety - Continue home medications: Lamictal ,   Peripheral neuropathy - Continue gabapentin  Discontinue Lyrica    History of obesity with prior gastric sleeve surgery - Noted loss of 60 pounds   OSA - Wears oxygen  at night    Consultants: Cardiology Procedures performed: Echocardiogram Disposition: Home Diet recommendation:  Discharge Diet Orders (From admission, onward)     Start     Ordered   11/27/23 0000  Diet - low sodium heart healthy        11/27/23 1009           Cardiac and Carb modified diet DISCHARGE MEDICATION: Allergies as of 11/27/2023       Reactions   Diclofenac  Swelling   AND POSSIBLE SYNCOPE; tolerates ibuprofen  per pt   Tramadol  Nausea And Vomiting, Nausea Only   Itching (12/21); tolerates ibuprofen  per pt   Vicodin [hydrocodone -acetaminophen ] Itching, Nausea Only        Medication List     STOP taking these medications    ibuprofen  600 MG tablet Commonly known as: ADVIL    pregabalin  150 MG capsule Commonly known as: LYRICA        TAKE these medications    Accu-Chek Guide test strip Generic drug: glucose blood USE AS DIRECTED TO TESTCBLOOD SUGAR TWICE DAILY.   albuterol  108 (90 Base) MCG/ACT inhaler Commonly known as: VENTOLIN  HFA Inhale 1 puff into the lungs every 6 (six) hours as needed for wheezing or shortness of breath. What changed: how much to take   aspirin  EC 81 MG tablet Take 81 mg by mouth daily.   budesonide -formoterol  80-4.5 MCG/ACT inhaler Commonly known as: Symbicort  Inhale 2 puffs into the lungs 2 (two) times daily.   carvedilol  25 MG tablet Commonly known as: COREG  Take 1 tablet (25 mg total) by mouth 2 (two) times daily with a meal.   clonazePAM  0.5 MG tablet Commonly known as: KLONOPIN  Take 0.5  mg by mouth 2 (two) times daily.   dapagliflozin  propanediol 10 MG Tabs tablet Commonly known as: Farxiga  Take 1 tablet (10 mg total) by mouth daily before breakfast.   Entresto  97-103 MG Generic drug: sacubitril -valsartan  Take 1 tablet by mouth 2 (two) times daily.   ipratropium-albuterol  0.5-2.5 (3) MG/3ML Soln Commonly known as: DUONEB Take 3 mLs by nebulization 3 (three) times daily.   isosorbide  mononitrate 30 MG 24 hr tablet Commonly known as: IMDUR  Take 1 tablet (30 mg total) by mouth daily. Start taking on: November 28, 2023 What changed:  medication strength how much to take   lamoTRIgine  100 MG 24 hour tablet Commonly known as: LAMICTAL  XR Take 100 mg by mouth daily.   nitroGLYCERIN  0.4 MG SL tablet Commonly known as: NITROSTAT  Place 1 tablet (0.4 mg total) under the tongue every 5 (five) minutes as needed for chest pain.   omeprazole  20 MG capsule Commonly known as: PRILOSEC Take 20 mg by mouth daily.   Ozempic (1 MG/DOSE) 4 MG/3ML Sopn Generic  drug: Semaglutide (1 MG/DOSE) Inject 1 mg into the skin once a week.   potassium chloride  SA 20 MEQ tablet Commonly known as: KLOR-CON  M Take 1 tablet (20 mEq total) by mouth daily.   spironolactone  25 MG tablet Commonly known as: ALDACTONE  Take 1 tablet (25 mg total) by mouth daily.   torsemide  20 MG tablet Commonly known as: DEMADEX  Take 2 tablets (40 mg total) by mouth daily.   traZODone  100 MG tablet Commonly known as: DESYREL  Take 2 tablets (200 mg total) by mouth at bedtime.        Discharge Exam: Filed Weights   11/25/23 0500 11/26/23 0300 11/27/23 0602  Weight: 79.4 kg 78.9 kg 75.8 kg        General:  AAO x 3,  cooperative, no distress;   HEENT:  Normocephalic, PERRL, otherwise with in Normal limits   Neuro:  CNII-XII intact. , normal motor and sensation, reflexes intact   Lungs:   Clear to auscultation BL, Respirations unlabored,  No wheezes / crackles  Cardio:    S1/S2, RRR, No murmure,  No Rubs or Gallops   Abdomen:  Soft, non-tender, bowel sounds active all four quadrants, no guarding or peritoneal signs.  Muscular  skeletal:  Limited exam -global generalized weaknesses - in bed, able to move all 4 extremities,   2+ pulses,  symmetric, No pitting edema  Skin:  Dry, warm to touch, negative for any Rashes,  Wounds: Please see nursing documentation      Condition at discharge: good  The results of significant diagnostics from this hospitalization (including imaging, microbiology, ancillary and laboratory) are listed below for reference.   Imaging Studies: ECHOCARDIOGRAM COMPLETE Result Date: 11/25/2023    ECHOCARDIOGRAM REPORT   Patient Name:   SANORA CUNANAN Date of Exam: 11/25/2023 Medical Rec #:  [id number]       Height:       66.0 in Accession #:    [id number]      Weight:       175.0 lb Date of Birth:  1976/04/12       BSA:          1.890 m Patient Age:    48 years        BP:           160/110 mmHg Patient Gender: F               HR:           67 bpm. Exam Location:  Inpatient Procedure: 2D Echo, 3D Echo, Cardiac Doppler, Color Doppler and Strain Analysis            (Both Spectral and Color Flow Doppler were utilized during            procedure). Indications:    CHF- Acute Systolic I50.21  History:        Patient has prior history of Echocardiogram examinations, most                 recent 12/15/2022. CHF; Risk Factors:Sleep Apnea, Dyslipidemia,                 Hypertension and Diabetes.  Sonographer:    Koleen Popper RDCS Referring Phys: [id number] PRATIK D [facility]  Sonographer Comments: Global longitudinal strain was attempted. IMPRESSIONS  1. Left ventricular ejection fraction, by estimation, is 30 to 35%. Left ventricular ejection fraction by 3D volume is 35 %. The left ventricle has moderately decreased function. The left ventricle demonstrates global hypokinesis.  There is moderate concentric left ventricular hypertrophy. Left ventricular diastolic parameters are indeterminate.  The average left ventricular global longitudinal strain is -12.5 %. The global longitudinal strain is abnormal.  2. Right ventricular systolic function is moderately reduced. The right ventricular size is normal. There is normal pulmonary artery systolic pressure. The estimated right ventricular systolic pressure is 29.0 mmHg.  3. Left atrial size was mildly dilated.  4. Right atrial size was mildly dilated.  5. A small pericardial effusion is present. The pericardial effusion is posterior to the left ventricle.  6. The mitral valve is grossly normal. Mild mitral valve regurgitation.  7. The aortic valve is tricuspid. There is mild calcification of the aortic valve. Aortic valve regurgitation is not visualized. Aortic valve sclerosis/calcification is present, without any evidence of aortic stenosis. Aortic valve mean gradient measures 10.3 mmHg.  8. The inferior vena cava is dilated in size with <50% respiratory variability, suggesting right atrial pressure of 15 mmHg. Comparison(s): Prior images reviewed side by side. Stable LVEF in range of 30-35% with moderate LVH, moderately decreased RV contraction. Mild mitral regurgitation. Sclerotic aortic valve without stenosis. FINDINGS  Left Ventricle: Left ventricular ejection fraction, by estimation, is 30 to 35%. Left ventricular ejection fraction by 3D volume is 35 %. The left ventricle has moderately decreased function. The left ventricle demonstrates global hypokinesis. The average left ventricular global longitudinal strain is -12.5 %. Strain was performed and the global longitudinal strain is abnormal. The left ventricular internal cavity size was normal in size. There is moderate concentric left ventricular hypertrophy. Left ventricular diastolic parameters are indeterminate. Right Ventricle: The right ventricular size is normal. No increase in right ventricular wall thickness. Right ventricular systolic function is moderately reduced. There is normal pulmonary  artery systolic pressure. The tricuspid regurgitant velocity is 1.87 m/s, and with an assumed right atrial pressure of 15 mmHg, the estimated right ventricular systolic pressure is 29.0 mmHg. Left Atrium: Left atrial size was mildly dilated. Right Atrium: Right atrial size was mildly dilated. Pericardium: A small pericardial effusion is present. The pericardial effusion is posterior to the left ventricle. Mitral Valve: The mitral valve is grossly normal. Mild mitral valve regurgitation. Tricuspid Valve: The tricuspid valve is grossly normal. Tricuspid valve regurgitation is mild. Aortic Valve: The aortic valve is tricuspid. There is mild calcification of the aortic valve. Aortic valve regurgitation is not visualized. Aortic valve sclerosis/calcification is present, without any evidence of aortic stenosis. Aortic valve mean gradient measures 10.3 mmHg. Aortic valve peak gradient measures 21.0 mmHg. Aortic valve area, by VTI measures 1.31 cm. Pulmonic Valve: The pulmonic valve was grossly normal. Pulmonic valve regurgitation is trivial. Aorta: The aortic root and ascending aorta are structurally normal, with no evidence of dilitation. Venous: The inferior vena cava is dilated in size with less than 50% respiratory variability, suggesting right atrial pressure of 15 mmHg. IAS/Shunts: No atrial level shunt detected by color flow Doppler. Additional Comments: 3D was performed not requiring image post processing on an independent workstation and was abnormal.  LEFT VENTRICLE PLAX 2D LVIDd:         5.20 cm         Diastology LVIDs:         4.30 cm         LV e' medial:    2.31 cm/s LV PW:         1.60 cm         LV E/e' medial:  37.8 LV IVS:  1.60 cm         LV e' lateral:   3.12 cm/s LVOT diam:     1.90 cm         LV E/e' lateral: 28.0 LV SV:         56 LV SV Index:   29              2D Longitudinal LVOT Area:     2.84 cm        Strain                                2D Strain GLS   -12.5 %                                 Avg:                                 3D Volume EF                                LV 3D EF:    Left                                             ventricul                                             ar                                             ejection                                             fraction                                             by 3D                                             volume is                                             35 %.                                 3D Volume EF:  3D EF:        35 %                                LV EDV:       252 ml                                LV ESV:       163 ml                                LV SV:        89 ml RIGHT VENTRICLE            IVC RV Basal diam:  4.50 cm    IVC diam: 3.20 cm RV Mid diam:    3.50 cm RV S prime:     8.93 cm/s TAPSE (M-mode): 2.0 cm LEFT ATRIUM             Index        RIGHT ATRIUM           Index LA diam:        4.20 cm 2.22 cm/m   RA Area:     18.40 cm LA Vol (A2C):   71.9 ml 38.05 ml/m  RA Volume:   49.80 ml  26.35 ml/m LA Vol (A4C):   64.8 ml 34.29 ml/m LA Biplane Vol: 68.6 ml 36.30 ml/m  AORTIC VALVE AV Area (Vmax):    1.35 cm AV Area (Vmean):   1.26 cm AV Area (VTI):     1.31 cm AV Vmax:           229.33 cm/s AV Vmean:          146.000 cm/s AV VTI:            0.424 m AV Peak Grad:      21.0 mmHg AV Mean Grad:      10.3 mmHg LVOT Vmax:         109.00 cm/s LVOT Vmean:        65.100 cm/s LVOT VTI:          0.196 m LVOT/AV VTI ratio: 0.46  AORTA Ao Root diam: 2.90 cm Ao Asc diam:  3.20 cm MITRAL VALVE               TRICUSPID VALVE MV Area (PHT): 3.42 cm    TR Peak grad:   14.0 mmHg MV Decel Time: 222 msec    TR Vmax:        187.00 cm/s MV E velocity: 87.30 cm/s MV A velocity: 50.70 cm/s  SHUNTS MV E/A ratio:  1.72        Systemic VTI:  0.20 m                            Systemic Diam: 1.90 cm Jayson Sierras MD Electronically signed by Jayson Sierras MD Signature Date/Time:  11/25/2023/12:54:57 PM    Final    DG Chest 2 View Result Date: 11/24/2023 CLINICAL DATA:  Shortness of breath. EXAM: CHEST - 2 VIEW COMPARISON:  12/03/2022. FINDINGS: Stable cardiomegaly. Mediastinal contours are within normal limits. Central pulmonary vascular congestion. No focal consolidation, pleural effusion, or pneumothorax. No acute osseous abnormality. IMPRESSION: Cardiomegaly with central  pulmonary vascular congestion. Electronically Signed   By: Harrietta Sherry M.D.   On: 11/24/2023 09:36    Microbiology: Results for orders placed or performed during the hospital encounter of 11/24/23  Resp panel by RT-PCR (RSV, Flu A&B, Covid) Anterior Nasal Swab     Status: None   Collection Time: 11/24/23 10:15 AM   Specimen: Anterior Nasal Swab  Result Value Ref Range Status   SARS Coronavirus 2 by RT PCR NEGATIVE NEGATIVE Final    Comment: (NOTE) SARS-CoV-2 target nucleic acids are NOT DETECTED.  The SARS-CoV-2 RNA is generally detectable in upper respiratory specimens during the acute phase of infection. The lowest concentration of SARS-CoV-2 viral copies this assay can detect is 138 copies/mL. A negative result does not preclude SARS-Cov-2 infection and should not be used as the sole basis for treatment or other patient management decisions. A negative result may occur with  improper specimen collection/handling, submission of specimen other than nasopharyngeal swab, presence of viral mutation(s) within the areas targeted by this assay, and inadequate number of viral copies(<138 copies/mL). A negative result must be combined with clinical observations, patient history, and epidemiological information. The expected result is Negative.  Fact Sheet for Patients:  BloggerCourse.com  Fact Sheet for Healthcare Providers:  SeriousBroker.it  This test is no t yet approved or cleared by the [place]  FDA and  has been authorized for  detection and/or diagnosis of SARS-CoV-2 by FDA under an Emergency Use Authorization (EUA). This EUA will remain  in effect (meaning this test can be used) for the duration of the COVID-19 declaration under Section 564(b)(1) of the Act, 21 U.S.C.section 360bbb-3(b)(1), unless the authorization is terminated  or revoked sooner.       Influenza A by PCR NEGATIVE NEGATIVE Final   Influenza B by PCR NEGATIVE NEGATIVE Final    Comment: (NOTE) The Xpert Xpress SARS-CoV-2/FLU/RSV plus assay is intended as an aid in the diagnosis of influenza from Nasopharyngeal swab specimens and should not be used as a sole basis for treatment. Nasal washings and aspirates are unacceptable for Xpert Xpress SARS-CoV-2/FLU/RSV testing.  Fact Sheet for Patients: BloggerCourse.com  Fact Sheet for Healthcare Providers: SeriousBroker.it  This test is not yet approved or cleared by the [place]  FDA and has been authorized for detection and/or diagnosis of SARS-CoV-2 by FDA under an Emergency Use Authorization (EUA). This EUA will remain in effect (meaning this test can be used) for the duration of the COVID-19 declaration under Section 564(b)(1) of the Act, 21 U.S.C. section 360bbb-3(b)(1), unless the authorization is terminated or revoked.     Resp Syncytial Virus by PCR NEGATIVE NEGATIVE Final    Comment: (NOTE) Fact Sheet for Patients: BloggerCourse.com  Fact Sheet for Healthcare Providers: SeriousBroker.it  This test is not yet approved or cleared by the [place]  FDA and has been authorized for detection and/or diagnosis of SARS-CoV-2 by FDA under an Emergency Use Authorization (EUA). This EUA will remain in effect (meaning this test can be used) for the duration of the COVID-19 declaration under Section 564(b)(1) of the Act, 21 U.S.C. section 360bbb-3(b)(1), unless the authorization is  terminated or revoked.  Performed at [facility], [address] [place], [place] [address]   MRSA Next Gen by PCR, Nasal     Status: Abnormal   Collection Time: 11/25/23 10:36 AM   Specimen: Nasal Mucosa; Nasal Swab  Result Value Ref Range Status   MRSA by PCR Next Gen POSITIVE (A) NOT DETECTED Final    Comment:  The GeneXpert MRSA Assay (FDA approved for NASAL specimens only), is one component of a comprehensive MRSA colonization surveillance program. It is not intended to diagnose MRSA infection nor to guide or monitor treatment for MRSA infections. RESULT CALLED TO, READ BACK BY AND VERIFIED WITH: HUI,BECK ON 11/25/2023 @9 :54PM BY T.HAMER  Performed at [facility], [address] [place], [place] [address]     Labs: CBC: Recent Labs  Lab 11/24/23 0946  WBC 4.0  NEUTROABS 2.6  HGB 10.6*  HCT 33.5*  MCV 73.3*  PLT 210   Basic Metabolic Panel: Recent Labs  Lab 11/24/23 0946 11/24/23 1056 11/25/23 0416 11/26/23 1217 11/27/23 0253  NA 140  --  142 140 137  K 2.3*  --  4.0 4.6 3.9  CL 95*  --  104 96* 93*  CO2 32  --  29 32 32  GLUCOSE 96  --  89 93 90  BUN 18  --  19 18 23*  CREATININE 1.33*  --  1.29* 1.40* 1.41*  CALCIUM  8.5*  --  8.5* 9.1 9.1  MG  --  1.4* 1.9  --   --    Liver Function Tests: Recent Labs  Lab 11/24/23 0946 11/27/23 0253  AST 74* 32  ALT 64* 36  ALKPHOS 100 94  BILITOT 1.0 0.6  PROT 7.4 7.3  ALBUMIN 3.7 3.4*   CBG: Recent Labs  Lab 11/26/23 0715 11/26/23 1122 11/26/23 1607 11/26/23 2055 11/27/23 0723  GLUCAP 130* 101* 140* 125* 93    Discharge time spent: greater than 40 minutes.  Signed: Adriana DELENA Grams, MD [facility] 11/27/2023

## 2024-01-06 ENCOUNTER — Encounter: Payer: Self-pay | Admitting: Radiology

## 2024-01-20 ENCOUNTER — Telehealth: Payer: Self-pay | Admitting: Cardiology
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
## 2024-01-23 NOTE — Telephone Encounter (Signed)
 Nena from [facility] called in and gave me another number to reach the pts about the medications.    [phone] pt Daughter

## 2024-01-25 NOTE — Telephone Encounter (Signed)
 Unable to leave message mailbox full. Also called number in previous telephone note

## 2024-01-26 NOTE — Telephone Encounter (Signed)
 Unable to reach patient or daughter.  For further assistance please have patient contact our office.

## 2024-01-31 MED ORDER — CARVEDILOL 25 MG PO TABS: 25.0000 mg | ORAL_TABLET | Freq: Two times a day (BID) | ORAL | 0 refills | Status: AC

## 2024-01-31 MED ORDER — SACUBITRIL-VALSARTAN 97-103 MG PO TABS: 1.0000 | ORAL_TABLET | Freq: Two times a day (BID) | ORAL | 0 refills | Status: AC

## 2024-01-31 MED ORDER — ISOSORBIDE MONONITRATE ER 30 MG PO TB24: 30.0000 mg | ORAL_TABLET | Freq: Every day | ORAL | 0 refills | Status: AC

## 2024-01-31 MED ORDER — POTASSIUM CHLORIDE CRYS ER 20 MEQ PO TBCR: 20.0000 meq | EXTENDED_RELEASE_TABLET | Freq: Every day | ORAL | 0 refills | Status: DC

## 2024-01-31 NOTE — Telephone Encounter (Signed)
 Reports she is completely out of all her medications and requesting refills be sent to [facility]. Advised that we could send cardiac medications. Advised that she needed to follow up in the heart failure clinic. Will forward to schedulers to reschedule missed January 2025 appointment in HF Clinic.   Will send message to Dr. Alvan for approval of refills.

## 2024-01-31 NOTE — Telephone Encounter (Signed)
 Per Dr. Alvan, okay to refill all cardiac medications. Spoke with pharmacist at [facility] who says torsemide , farxiga , spironolactone  all had new prescriptions on file. Will send all other cardiac medications.

## 2024-02-03 ENCOUNTER — Encounter (HOSPITAL_COMMUNITY): Payer: Self-pay | Admitting: Adult Health

## 2024-02-03 ENCOUNTER — Other Ambulatory Visit (HOSPITAL_COMMUNITY): Payer: Self-pay

## 2024-02-03 ENCOUNTER — Telehealth (HOSPITAL_COMMUNITY): Payer: Self-pay

## 2024-02-03 NOTE — Telephone Encounter (Signed)
 Advanced Heart Failure Patient Advocate Encounter  Prior authorization for Farxiga  has been submitted and approved. Test billing returns $4 for 90 day supply.  Key: [id number] Effective: 02/03/2024 to 02/02/2025  Rachel DEL, CPhT Rx Patient Advocate Phone: [phone]

## 2024-02-08 ENCOUNTER — Emergency Department (HOSPITAL_COMMUNITY)
Admission: EM | Admit: 2024-02-08 | Discharge: 2024-02-08 | Disposition: A | Discharge status: Home / Self Care | Attending: Emergency Medicine | Admitting: Emergency Medicine

## 2024-02-08 ENCOUNTER — Other Ambulatory Visit: Payer: Self-pay

## 2024-02-08 ENCOUNTER — Emergency Department (HOSPITAL_COMMUNITY)

## 2024-02-08 ENCOUNTER — Encounter (HOSPITAL_COMMUNITY): Payer: Self-pay

## 2024-02-08 DIAGNOSIS — I110 Hypertensive heart disease with heart failure: Secondary | ICD-10-CM | POA: Diagnosis not present

## 2024-02-08 DIAGNOSIS — R600 Localized edema: Secondary | ICD-10-CM | POA: Diagnosis not present

## 2024-02-08 DIAGNOSIS — Z7982 Long term (current) use of aspirin: Secondary | ICD-10-CM | POA: Insufficient documentation

## 2024-02-08 DIAGNOSIS — R0602 Shortness of breath: Secondary | ICD-10-CM | POA: Diagnosis present

## 2024-02-08 DIAGNOSIS — I5042 Chronic combined systolic (congestive) and diastolic (congestive) heart failure: Secondary | ICD-10-CM | POA: Diagnosis not present

## 2024-02-08 DIAGNOSIS — E119 Type 2 diabetes mellitus without complications: Secondary | ICD-10-CM | POA: Insufficient documentation

## 2024-02-08 DIAGNOSIS — I5023 Acute on chronic systolic (congestive) heart failure: Secondary | ICD-10-CM

## 2024-02-08 DIAGNOSIS — R7989 Other specified abnormal findings of blood chemistry: Secondary | ICD-10-CM | POA: Insufficient documentation

## 2024-02-08 DIAGNOSIS — Z79899 Other long term (current) drug therapy: Secondary | ICD-10-CM | POA: Diagnosis not present

## 2024-02-08 DIAGNOSIS — I10 Essential (primary) hypertension: Secondary | ICD-10-CM

## 2024-02-08 LAB — BASIC METABOLIC PANEL WITH GFR
Anion gap: 11 (ref 5–15)
BUN: 23 mg/dL — ABNORMAL HIGH (ref 6–20)
CO2: 27 mmol/L (ref 22–32)
Calcium: 9 mg/dL (ref 8.9–10.3)
Chloride: 101 mmol/L (ref 98–111)
Creatinine, Ser: 1.55 mg/dL — ABNORMAL HIGH (ref 0.44–1.00)
GFR, Estimated: 41 mL/min — ABNORMAL LOW (ref >60)
Glucose, Bld: 127 mg/dL — ABNORMAL HIGH (ref 70–99)
Potassium: 4.3 mmol/L (ref 3.5–5.1)
Sodium: 139 mmol/L (ref 135–145)

## 2024-02-08 LAB — CBC WITH DIFFERENTIAL/PLATELET
Abs Immature Granulocytes: 0.02 K/uL (ref 0.00–0.07)
Basophils Absolute: 0.1 K/uL (ref 0.0–0.1)
Basophils Relative: 1 %
Eosinophils Absolute: 0.1 K/uL (ref 0.0–0.5)
Eosinophils Relative: 2 %
HCT: 33.9 % — ABNORMAL LOW (ref 36.0–46.0)
Hemoglobin: 10.7 g/dL — ABNORMAL LOW (ref 12.0–15.0)
Immature Granulocytes: 0 %
Lymphocytes Relative: 25 %
Lymphs Abs: 1.6 K/uL (ref 0.7–4.0)
MCH: 23.7 pg — ABNORMAL LOW (ref 26.0–34.0)
MCHC: 31.6 g/dL (ref 30.0–36.0)
MCV: 75 fL — ABNORMAL LOW (ref 80.0–100.0)
Monocytes Absolute: 0.4 K/uL (ref 0.1–1.0)
Monocytes Relative: 7 %
Neutro Abs: 4.1 K/uL (ref 1.7–7.7)
Neutrophils Relative %: 65 %
Platelets: 220 K/uL (ref 150–400)
RBC: 4.52 MIL/uL (ref 3.87–5.11)
RDW: 16.5 % — ABNORMAL HIGH (ref 11.5–15.5)
WBC: 6.3 K/uL (ref 4.0–10.5)
nRBC: 0 % (ref 0.0–0.2)

## 2024-02-08 LAB — BRAIN NATRIURETIC PEPTIDE: B Natriuretic Peptide: >4500 pg/mL — ABNORMAL HIGH (ref 0.0–100.0)

## 2024-02-08 MED ORDER — FUROSEMIDE 10 MG/ML IJ SOLN
80.0000 mg | Freq: Once | INTRAMUSCULAR | Status: AC
  Administered 2024-02-08: 80 mg via INTRAVENOUS
  Filled 2024-02-08: qty 8
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
## 2024-02-08 NOTE — ED Notes (Signed)
 Pts BP elevated at 184/116, states she does take BP medication but hasn't taken it yet this morning

## 2024-02-08 NOTE — Discharge Instructions (Signed)
 You were seen today with concerns for heart failure.  Continue taking your torsemide .  Take your regularly scheduled dose today.  Call your cardiology office to discuss whether you need to increase it for several days.  You were given 80 mg of Lasix  here with good diuresis.  Make sure that you are also taking your blood pressure medications at home.  If you develop worsening shortness of breath, chest pain, any new or worsening symptoms, you should be reevaluated.

## 2024-03-19 ENCOUNTER — Encounter: Payer: Self-pay | Admitting: Radiology

## 2024-04-03 ENCOUNTER — Encounter (HOSPITAL_COMMUNITY): Payer: Self-pay

## 2024-04-03 ENCOUNTER — Emergency Department (HOSPITAL_COMMUNITY)

## 2024-04-03 ENCOUNTER — Other Ambulatory Visit: Payer: Self-pay

## 2024-04-03 ENCOUNTER — Emergency Department (HOSPITAL_COMMUNITY)
Admission: EM | Admit: 2024-04-03 | Discharge: 2024-04-03 | Discharge status: Against Medical Advice | Attending: Emergency Medicine | Admitting: Emergency Medicine

## 2024-04-03 DIAGNOSIS — R519 Headache, unspecified: Secondary | ICD-10-CM | POA: Insufficient documentation

## 2024-04-03 DIAGNOSIS — R2243 Localized swelling, mass and lump, lower limb, bilateral: Secondary | ICD-10-CM | POA: Diagnosis not present

## 2024-04-03 DIAGNOSIS — R0602 Shortness of breath: Secondary | ICD-10-CM | POA: Diagnosis present

## 2024-04-03 DIAGNOSIS — Z5321 Procedure and treatment not carried out due to patient leaving prior to being seen by health care provider: Secondary | ICD-10-CM | POA: Diagnosis not present

## 2024-04-03 DIAGNOSIS — R110 Nausea: Secondary | ICD-10-CM | POA: Diagnosis not present

## 2024-04-03 DIAGNOSIS — R0789 Other chest pain: Secondary | ICD-10-CM | POA: Insufficient documentation

## 2024-04-03 LAB — CBC WITH DIFFERENTIAL/PLATELET
Abs Immature Granulocytes: 0.02 K/uL (ref 0.00–0.07)
Basophils Absolute: 0.1 K/uL (ref 0.0–0.1)
Basophils Relative: 1 %
Eosinophils Absolute: 0.1 K/uL (ref 0.0–0.5)
Eosinophils Relative: 2 %
HCT: 39.1 % (ref 36.0–46.0)
Hemoglobin: 12.2 g/dL (ref 12.0–15.0)
Immature Granulocytes: 0 %
Lymphocytes Relative: 20 %
Lymphs Abs: 1.3 K/uL (ref 0.7–4.0)
MCH: 23.4 pg — ABNORMAL LOW (ref 26.0–34.0)
MCHC: 31.2 g/dL (ref 30.0–36.0)
MCV: 75 fL — ABNORMAL LOW (ref 80.0–100.0)
Monocytes Absolute: 0.4 K/uL (ref 0.1–1.0)
Monocytes Relative: 7 %
Neutro Abs: 4.4 K/uL (ref 1.7–7.7)
Neutrophils Relative %: 70 %
Platelets: 219 K/uL (ref 150–400)
RBC: 5.21 MIL/uL — ABNORMAL HIGH (ref 3.87–5.11)
RDW: 14.9 % (ref 11.5–15.5)
WBC: 6.2 K/uL (ref 4.0–10.5)
nRBC: 0 % (ref 0.0–0.2)

## 2024-04-03 LAB — COMPREHENSIVE METABOLIC PANEL WITH GFR
ALT: 18 U/L (ref 0–44)
AST: 28 U/L (ref 15–41)
Albumin: 4.3 g/dL (ref 3.5–5.0)
Alkaline Phosphatase: 100 U/L (ref 38–126)
Anion gap: 9 (ref 5–15)
BUN: 17 mg/dL (ref 6–20)
CO2: 29 mmol/L (ref 22–32)
Calcium: 9.4 mg/dL (ref 8.9–10.3)
Chloride: 105 mmol/L (ref 98–111)
Creatinine, Ser: 0.96 mg/dL (ref 0.44–1.00)
GFR, Estimated: >60 mL/min (ref >60)
Glucose, Bld: 101 mg/dL — ABNORMAL HIGH (ref 70–99)
Potassium: 4.1 mmol/L (ref 3.5–5.1)
Sodium: 142 mmol/L (ref 135–145)
Total Bilirubin: 0.7 mg/dL (ref 0.0–1.2)
Total Protein: 7.4 g/dL (ref 6.5–8.1)

## 2024-04-03 LAB — PRO BRAIN NATRIURETIC PEPTIDE: Pro Brain Natriuretic Peptide: 8341 pg/mL — ABNORMAL HIGH (ref <300.0)

## 2024-04-03 LAB — TROPONIN T, HIGH SENSITIVITY: Troponin T High Sensitivity: 28 ng/L — ABNORMAL HIGH (ref 0–19)

## 2024-04-03 NOTE — ED Triage Notes (Signed)
 Pt arrived via POV c/o SOB and tightness in her chest since Sunday. Pt reports swelling in her legs and endorses a headache, nausea and weakness. Pt reports she took all her prescribed medications this morning.

## 2024-04-03 NOTE — ED Notes (Addendum)
 Pt left before being seen @1244  per registration.

## 2024-05-11 ENCOUNTER — Other Ambulatory Visit (HOSPITAL_COMMUNITY): Payer: Self-pay | Admitting: Cardiology

## 2024-05-14 ENCOUNTER — Telehealth (HOSPITAL_COMMUNITY): Payer: Self-pay

## 2024-05-14 ENCOUNTER — Other Ambulatory Visit (HOSPITAL_COMMUNITY): Payer: Self-pay | Admitting: *Deleted

## 2024-05-14 ENCOUNTER — Ambulatory Visit (HOSPITAL_COMMUNITY): Admitting: Cardiology

## 2024-05-14 MED ORDER — TORSEMIDE 20 MG PO TABS: 40.0000 mg | ORAL_TABLET | Freq: Every day | ORAL | 0 refills | Status: AC

## 2024-05-14 NOTE — Telephone Encounter (Signed)
 Called to confirm/remind patient of their appointment at the [facility] on 12/29.   Appointment:   [x] Confirmed  [] Left mess   [] No answer/No voice mail  [] VM Full/unable to leave message  [] Phone not in service  Patient reminded to bring all medications and/or complete list.  Confirmed patient has transportation. Gave directions, instructed to utilize valet parking.

## 2024-05-15 ENCOUNTER — Ambulatory Visit (HOSPITAL_COMMUNITY)
Admission: RE | Admit: 2024-05-15 | Discharge: 2024-05-15 | Disposition: A | Discharge status: Home / Self Care | Source: Ambulatory Visit | Attending: Physician Assistant | Admitting: Physician Assistant

## 2024-05-15 VITALS — BP 162/104 | HR 80 | Ht 66.0 in | Wt 177.6 lb

## 2024-05-15 DIAGNOSIS — I5022 Chronic systolic (congestive) heart failure: Secondary | ICD-10-CM | POA: Diagnosis not present

## 2024-05-15 DIAGNOSIS — I350 Nonrheumatic aortic (valve) stenosis: Secondary | ICD-10-CM | POA: Diagnosis not present

## 2024-05-15 DIAGNOSIS — I428 Other cardiomyopathies: Secondary | ICD-10-CM | POA: Diagnosis not present

## 2024-05-15 DIAGNOSIS — G4733 Obstructive sleep apnea (adult) (pediatric): Secondary | ICD-10-CM | POA: Diagnosis not present

## 2024-05-15 DIAGNOSIS — E119 Type 2 diabetes mellitus without complications: Secondary | ICD-10-CM | POA: Diagnosis not present

## 2024-05-15 DIAGNOSIS — Z7984 Long term (current) use of oral hypoglycemic drugs: Secondary | ICD-10-CM | POA: Insufficient documentation

## 2024-05-15 DIAGNOSIS — I110 Hypertensive heart disease with heart failure: Secondary | ICD-10-CM | POA: Diagnosis not present

## 2024-05-15 DIAGNOSIS — Z79899 Other long term (current) drug therapy: Secondary | ICD-10-CM | POA: Insufficient documentation

## 2024-05-15 DIAGNOSIS — Z9884 Bariatric surgery status: Secondary | ICD-10-CM | POA: Insufficient documentation

## 2024-05-15 DIAGNOSIS — I10 Essential (primary) hypertension: Secondary | ICD-10-CM | POA: Diagnosis not present

## 2024-05-15 DIAGNOSIS — Z7985 Long-term (current) use of injectable non-insulin antidiabetic drugs: Secondary | ICD-10-CM | POA: Diagnosis not present

## 2024-05-15 DIAGNOSIS — Z8249 Family history of ischemic heart disease and other diseases of the circulatory system: Secondary | ICD-10-CM | POA: Diagnosis not present

## 2024-05-15 DIAGNOSIS — E669 Obesity, unspecified: Secondary | ICD-10-CM | POA: Diagnosis not present

## 2024-05-15 LAB — BASIC METABOLIC PANEL WITH GFR
Anion gap: 10 (ref 5–15)
BUN: 23 mg/dL — ABNORMAL HIGH (ref 6–20)
CO2: 28 mmol/L (ref 22–32)
Calcium: 9.5 mg/dL (ref 8.9–10.3)
Chloride: 103 mmol/L (ref 98–111)
Creatinine, Ser: 1.07 mg/dL — ABNORMAL HIGH (ref 0.44–1.00)
GFR, Estimated: >60 mL/min
Glucose, Bld: 100 mg/dL — ABNORMAL HIGH (ref 70–99)
Potassium: 3.8 mmol/L (ref 3.5–5.1)
Sodium: 141 mmol/L (ref 135–145)

## 2024-05-15 LAB — PRO BRAIN NATRIURETIC PEPTIDE: Pro Brain Natriuretic Peptide: 9266 pg/mL — ABNORMAL HIGH

## 2024-05-15 MED ORDER — DAPAGLIFLOZIN PROPANEDIOL 10 MG PO TABS: 10.0000 mg | ORAL_TABLET | Freq: Every day | ORAL | 3 refills | Status: AC

## 2024-05-15 MED ORDER — SPIRONOLACTONE 50 MG PO TABS: 50.0000 mg | ORAL_TABLET | Freq: Every day | ORAL | 3 refills | Status: AC

## 2024-05-15 NOTE — Patient Instructions (Addendum)
 Medication Changes:  RESTART FARXIGA  10MG  ONCE DAILY   INCREASE SPIRONOLACTONE  TO 50MG  ONCE DAILY   STOP POTASSIUM SUPPLEMENT   Lab Work:  Labs done today, your results will be available in MyChart, we will contact you for abnormal readings.  Genetic testing has been collected, this has to be sent to [place]  for processing and can take 1-2 weeks for us  to get results back.  We will let you know the results once reviewed by your provider.  PLEASE HAVE LABS DONE NEXT WEEK AT [facility] IN [place]- LOCATIONS LISTED BELOW  Address: [address] [place], [place] [address] Hours:  Open ? Closes 12:30?PM ? Reopens 1:30?PM Phone: [phone] Appointments: [email or web address]  Address: [address] [place], [place] [address] Hours:  Open ? Closes 12?PM ? Reopens 1?PM Phone: [phone] Appointments: [email or web address]  Follow-Up in: 4 WEEKS AS SCHEDULED WITH APP CLINIC   At the [facility], you and your health needs are our priority. We have a designated team specialized in the treatment of Heart Failure. This Care Team includes your primary Heart Failure Specialized Cardiologist (physician), Advanced Practice Providers (APPs- Physician Assistants and Nurse Practitioners), and Pharmacist who all work together to provide you with the care you need, when you need it.   You may see any of the following providers on your designated Care Team at your next follow up:  Dr. Toribio Fuel Dr. Ezra Shuck Dr. Odis Brownie Greig Mosses, NP Caffie Shed, [place] [facility] [place], [place] Beckey Coe, NP Jordan Lee, NP Tinnie Redman, PharmD   Please be sure to bring in all your medications bottles to every appointment.   Need to Contact Us :  If you have any questions or concerns before your next appointment please send us  a message through [facility] or call our office at [phone].    TO LEAVE A MESSAGE FOR THE NURSE SELECT OPTION 2, PLEASE LEAVE A MESSAGE  INCLUDING: YOUR NAME DATE OF BIRTH CALL BACK NUMBER REASON FOR CALL**this is important as we prioritize the call backs  YOU WILL RECEIVE A CALL BACK THE SAME DAY AS LONG AS YOU CALL BEFORE 4:00 PM

## 2024-05-15 NOTE — Progress Notes (Signed)
 Cardiomyopathy genetic testing collected via blood sample per Manuelita Dutch, PA.  Order form completed, signed and shipped with sample by FedEx to [facility].

## 2024-05-15 NOTE — Progress Notes (Signed)
 PCP: Leavy Waddell NOVAK, FNP Cardiology: Dr. Alvan HF Cardiology: Dr. Rolan  48 y.o. with history of hypertension, nonischemic cardiomyopathy, obesity s/p sleeve gastrectomy in 10/24, OSA/OHS.  Patient has a long history of poorly controlled HTN.  She also has a history since at least 2015 of cardiomyopathy.  Echo in 11/15 showed EF 25%, LHC in 8/17 showed no coronary disease, cardiac MRI in 11/17 showed EF 25% with no delayed enhancement.  Dcho in 7/24 showed EF 30-35%, global hypokinesis, normal RV, mild aortic stenosis with mean gradient 10 mmHg, IVC normal.  She has mild aortic stenosis at a young age but the aortic valve is trileaflet.   She was last seen in clinic back in 12/24.   Admitted to [facility] in 7/25 with acute on chronic CHF in setting of uncontrolled HTN. Echo with EF 30-35%, moderate LVH, RV moderately reduced, mild AS with mean gradient 10 mmHg. Seen in ED 9/25 with volume overload after she had briefly been off her medications. Given IV lasix  and felt to be stable for dsicharge home.  Here today for CHF follow-up. She has been off Farxiga  for a while, states she was never given refills by the pharmacy. Hydralazine  was stopped in July but she states she has still been taking 100 mg TID. No shortness of breath, orthopnea or PND. Reports lower extremity edema that comes and goes. Wears compression stockings regularly, edema worse if doesn't wear them. Takes an extra 20 mg Torsemide  about once a week. Has lost about 50 lb since sleeve gastrectomy. Does eat some canned foods and bacon but does not use salt shaker. Cooks most meals.  She does not drink, smoke, or use drugs.  She does have a strong family history of cardiac disease, parents with CHF and a brother and nephew died early from cardiac disease (heart attack per her report).    ECG (personally reviewed): None today  Labs (11/24): K 3.6, creatinine 1.03  PMH: 1. HTN: Renal artery dopplers showed no renal artery stenosis. 2.  OHS/OSA 3. Obesity: S/p sleeve gastrectomy in 10/24. 4. Chronic systolic CHF: Nonischemic cardiomyopathy.  - Echo (11/15): EF 35% - LHC (8/17): No CAD - Cardiac MRI (11/17): LV EF 35%, moderate LVH, mild RV dilation/mildly decreased RV systolic function, no LGE.  - Echo (11/21): EF 30-35% - Echo (11/22): EF 30-35% - Echo (7/24): EF 30-35%, global hypokinesis, normal RV, mild aortic stenosis with mean gradient 10 mmHg, IVC normal.  - Echo (7/25): EF 30-35%, moderate LVH, RV moderately reduced, mild AS with mean gradient 10 mmHg 5. Aortic stenosis: Mild on 7/25 echo  SH: Lives in [place], no ETOH or smoking.  No drugs. 6 kids.   FH: Mother and grandmother died of CHF in 1s/60s, brother died with MI.  Nephew died at 41 with MI.   ROS: All systems reviewed and negative except as per HPI.   Current Outpatient Medications  Medication Sig Dispense Refill   ACCU-CHEK GUIDE test strip USE AS DIRECTED TO TESTCBLOOD SUGAR TWICE DAILY.     albuterol  (VENTOLIN  HFA) 108 (90 Base) MCG/ACT inhaler Inhale 1 puff into the lungs every 6 (six) hours as needed for wheezing or shortness of breath. (Patient taking differently: Inhale 2 puffs into the lungs every 6 (six) hours as needed for wheezing or shortness of breath.) 18 g 0   aspirin  EC 81 MG tablet Take 81 mg by mouth daily.     budesonide -formoterol  (SYMBICORT ) 80-4.5 MCG/ACT inhaler Inhale 2 puffs into the lungs 2 (  two) times daily. 10.2 g 0   carvedilol  (COREG ) 25 MG tablet Take 1 tablet (25 mg total) by mouth 2 (two) times daily with a meal. 180 tablet 0   clonazePAM  (KLONOPIN ) 0.5 MG tablet Take 0.5 mg by mouth 2 (two) times daily.     hydrALAZINE  (APRESOLINE ) 100 MG tablet Take 100 mg by mouth 3 (three) times daily.     ipratropium-albuterol  (DUONEB) 0.5-2.5 (3) MG/3ML SOLN Take 3 mLs by nebulization 3 (three) times daily. 360 mL 1   isosorbide  mononitrate (IMDUR ) 30 MG 24 hr tablet Take 1 tablet (30 mg total) by mouth daily. 90 tablet 0    lamoTRIgine  (LAMICTAL  XR) 100 MG 24 hour tablet Take 100 mg by mouth daily.     nitroGLYCERIN  (NITROSTAT ) 0.4 MG SL tablet Place 1 tablet (0.4 mg total) under the tongue every 5 (five) minutes as needed for chest pain. 25 tablet 3   omeprazole  (PRILOSEC) 20 MG capsule Take 20 mg by mouth daily.     OZEMPIC, 1 MG/DOSE, 4 MG/3ML SOPN Inject 1 mg into the skin once a week.     sacubitril -valsartan  (ENTRESTO ) 97-103 MG Take 1 tablet by mouth 2 (two) times daily. Need appointment 180 tablet 0   torsemide  (DEMADEX ) 20 MG tablet Take 2 tablets (40 mg total) by mouth daily. 180 tablet 0   traZODone  (DESYREL ) 100 MG tablet Take 2 tablets (200 mg total) by mouth at bedtime.     dapagliflozin  propanediol (FARXIGA ) 10 MG TABS tablet Take 1 tablet (10 mg total) by mouth daily before breakfast. 90 tablet 3   spironolactone  (ALDACTONE ) 50 MG tablet Take 1 tablet (50 mg total) by mouth daily. 90 tablet 3   No current facility-administered medications for this encounter.   BP (!) 162/104   Pulse 80   Ht 5' 6 (1.676 m)   Wt 80.6 kg (177 lb 9.6 oz)   LMP 08/21/2019   SpO2 98%   BMI 28.67 kg/m  General:  Ambulated into clinic. No distress. Neck: JVP ~ 8 cm with HJR Cor: Regular rate & rhythm. No murmurs. Lungs: clear Abdomen: soft, nontender, nondistended. Extremities: trace edema, + compression stockings Neuro: alert & orientedx3. Affect pleasant   Assessment/Plan: 1.  Chronic systolic CHF: Nonischemic cardiomyopathy.  Cath in 8/17 with no significant CAD.  Cardiac MRI in 11/17 showed LV EF 35%, moderate LVH, mild RV dilation/mildly decreased RV systolic function, no LGE.  Most recent echo in 7/24 showed EF 30-35%, global hypokinesis, normal RV, mild aortic stenosis with mean gradient 10 mmHg, IVC normal.   She does not use ETOH or drugs.  She does have a family history of CHF.  She has had years of uncontrolled HTN.  Cause of cardiomyopathy likely HTN versus familial.   - NYHA II. Volume mildly up on  exam.  Start Farxiga  10 mg daily.   - Continue Torsemide  40 mg daily - Continue Coreg  25 mg bid - Continue Entresto  97/103 bid - Continue hydralazine  100 tid and Imdur  30 mg daily  - Increase spiro to 50 mg daily - Arrange genetic testing for hereditary cardiomyopathies - Will repeat echo once on maximal GDMT with BP controlled, and if EF < 35%, she will need ICD.  Narrow QRS so not CRT candidate.  2. HTN: Uncontrolled.  Renal artery dopplers in 9/23 showed no renal artery stenosis.  - Medication changes as noted above.  - Mild OSA on sleep study in 2023 - has lost 50 lbs since then 3.  Aortic stenosis: Mild on echo in 7/24.  She is young for AS but valve appears trileaflet.  4. Obesity: Has lost significant weight post-sleeve gastrectomy.  - Continues on ozempic 5. DM: No longer requiring insulin  - Continue ozempic + farxiga   Followup  4 weeks with APP  [facility], Daniela Hernan N 05/15/2024

## 2024-05-16 ENCOUNTER — Ambulatory Visit (HOSPITAL_COMMUNITY): Payer: Self-pay | Admitting: Physician Assistant

## 2024-06-13 NOTE — Progress Notes (Incomplete)
 PCP: Leavy Waddell NOVAK, FNP Cardiology: Dr. Alvan HF Cardiology: Dr. Rolan  49 y.o. with history of hypertension, nonischemic cardiomyopathy, obesity s/p sleeve gastrectomy in 10/24, OSA/OHS.  Patient has a long history of poorly controlled HTN.  She also has a history since at least 2015 of cardiomyopathy.  Echo in 11/15 showed EF 25%, LHC in 8/17 showed no coronary disease, cardiac MRI in 11/17 showed EF 25% with no delayed enhancement.  Dcho in 7/24 showed EF 30-35%, global hypokinesis, normal RV, mild aortic stenosis with mean gradient 10 mmHg, IVC normal.  She has mild aortic stenosis at a young age but the aortic valve is trileaflet.   She was last seen in clinic back in 12/24.   Admitted to [facility] in 7/25 with acute on chronic CHF in setting of uncontrolled HTN. Echo with EF 30-35%, moderate LVH, RV moderately reduced, mild AS with mean gradient 10 mmHg. Seen in ED 9/25 with volume overload after she had briefly been off her medications. Given IV lasix  and felt to be stable for dsicharge home.  Here today for CHF follow-up. She has been off Farxiga  for a while, states she was never given refills by the pharmacy. Hydralazine  was stopped in July but she states she has still been taking 100 mg TID. No shortness of breath, orthopnea or PND. Reports lower extremity edema that comes and goes. Wears compression stockings regularly, edema worse if doesn't wear them. Takes an extra 20 mg Torsemide  about once a week. Has lost about 50 lb since sleeve gastrectomy. Does eat some canned foods and bacon but does not use salt shaker. Cooks most meals.  She does not drink, smoke, or use drugs.  She does have a strong family history of cardiac disease, parents with CHF and a brother and nephew died early from cardiac disease (heart attack per her report).    ECG (personally reviewed): None today  Labs (11/24): K 3.6, creatinine 1.03  PMH: 1. HTN: Renal artery dopplers showed no renal artery stenosis. 2.  OHS/OSA 3. Obesity: S/p sleeve gastrectomy in 10/24. 4. Chronic systolic CHF: Nonischemic cardiomyopathy.  - Echo (11/15): EF 35% - LHC (8/17): No CAD - Cardiac MRI (11/17): LV EF 35%, moderate LVH, mild RV dilation/mildly decreased RV systolic function, no LGE.  - Echo (11/21): EF 30-35% - Echo (11/22): EF 30-35% - Echo (7/24): EF 30-35%, global hypokinesis, normal RV, mild aortic stenosis with mean gradient 10 mmHg, IVC normal.  - Echo (7/25): EF 30-35%, moderate LVH, RV moderately reduced, mild AS with mean gradient 10 mmHg 5. Aortic stenosis: Mild on 7/25 echo  SH: Lives in [place], no ETOH or smoking.  No drugs. 6 kids.   FH: Mother and grandmother died of CHF in 2s/60s, brother died with MI.  Nephew died at 39 with MI.   ROS: All systems reviewed and negative except as per HPI.   Current Outpatient Medications  Medication Sig Dispense Refill   ACCU-CHEK GUIDE test strip USE AS DIRECTED TO TESTCBLOOD SUGAR TWICE DAILY.     albuterol  (VENTOLIN  HFA) 108 (90 Base) MCG/ACT inhaler Inhale 1 puff into the lungs every 6 (six) hours as needed for wheezing or shortness of breath. (Patient taking differently: Inhale 2 puffs into the lungs every 6 (six) hours as needed for wheezing or shortness of breath.) 18 g 0   aspirin  EC 81 MG tablet Take 81 mg by mouth daily.     budesonide -formoterol  (SYMBICORT ) 80-4.5 MCG/ACT inhaler Inhale 2 puffs into the lungs 2 (  two) times daily. 10.2 g 0   carvedilol  (COREG ) 25 MG tablet Take 1 tablet (25 mg total) by mouth 2 (two) times daily with a meal. 180 tablet 0   clonazePAM  (KLONOPIN ) 0.5 MG tablet Take 0.5 mg by mouth 2 (two) times daily.     dapagliflozin  propanediol (FARXIGA ) 10 MG TABS tablet Take 1 tablet (10 mg total) by mouth daily before breakfast. 90 tablet 3   hydrALAZINE  (APRESOLINE ) 100 MG tablet Take 100 mg by mouth 3 (three) times daily.     ipratropium-albuterol  (DUONEB) 0.5-2.5 (3) MG/3ML SOLN Take 3 mLs by nebulization 3 (three)  times daily. 360 mL 1   isosorbide  mononitrate (IMDUR ) 30 MG 24 hr tablet Take 1 tablet (30 mg total) by mouth daily. 90 tablet 0   lamoTRIgine  (LAMICTAL  XR) 100 MG 24 hour tablet Take 100 mg by mouth daily.     nitroGLYCERIN  (NITROSTAT ) 0.4 MG SL tablet Place 1 tablet (0.4 mg total) under the tongue every 5 (five) minutes as needed for chest pain. 25 tablet 3   omeprazole  (PRILOSEC) 20 MG capsule Take 20 mg by mouth daily.     OZEMPIC, 1 MG/DOSE, 4 MG/3ML SOPN Inject 1 mg into the skin once a week.     sacubitril -valsartan  (ENTRESTO ) 97-103 MG Take 1 tablet by mouth 2 (two) times daily. Need appointment 180 tablet 0   spironolactone  (ALDACTONE ) 50 MG tablet Take 1 tablet (50 mg total) by mouth daily. 90 tablet 3   torsemide  (DEMADEX ) 20 MG tablet Take 2 tablets (40 mg total) by mouth daily. 180 tablet 0   traZODone  (DESYREL ) 100 MG tablet Take 2 tablets (200 mg total) by mouth at bedtime.     No current facility-administered medications for this visit.   LMP 08/21/2019  General:  Ambulated into clinic. No distress. Neck: JVP ~ 8 cm with HJR Cor: Regular rate & rhythm. No murmurs. Lungs: clear Abdomen: soft, nontender, nondistended. Extremities: trace edema, + compression stockings Neuro: alert & orientedx3. Affect pleasant   Assessment/Plan: 1.  Chronic systolic CHF: Nonischemic cardiomyopathy.  Cath in 8/17 with no significant CAD.  Cardiac MRI in 11/17 showed LV EF 35%, moderate LVH, mild RV dilation/mildly decreased RV systolic function, no LGE.  Most recent echo in 7/24 showed EF 30-35%, global hypokinesis, normal RV, mild aortic stenosis with mean gradient 10 mmHg, IVC normal.   She does not use ETOH or drugs.  She does have a family history of CHF.  She has had years of uncontrolled HTN.  Cause of cardiomyopathy likely HTN versus familial.   - NYHA II. Volume mildly up on exam.  Start Farxiga  10 mg daily.   - Continue Torsemide  40 mg daily - Continue Coreg  25 mg bid - Continue  Entresto  97/103 bid - Continue hydralazine  100 tid and Imdur  30 mg daily  - Increase spiro to 50 mg daily - Arrange genetic testing for hereditary cardiomyopathies - Will repeat echo once on maximal GDMT with BP controlled, and if EF < 35%, she will need ICD.  Narrow QRS so not CRT candidate.  2. HTN: Uncontrolled.  Renal artery dopplers in 9/23 showed no renal artery stenosis.  - Medication changes as noted above.  - Mild OSA on sleep study in 2023 - has lost 50 lbs since then 3. Aortic stenosis: Mild on echo in 7/24.  She is young for AS but valve appears trileaflet.  4. Obesity: Has lost significant weight post-sleeve gastrectomy.  - Continues on ozempic 5. DM: No  longer requiring insulin  - Continue ozempic + farxiga   Followup  4 weeks with [facility]  Harlene HERO [facility] 06/13/2024

## 2024-06-14 ENCOUNTER — Telehealth (HOSPITAL_COMMUNITY): Payer: Self-pay

## 2024-06-14 NOTE — Telephone Encounter (Signed)
 Called to confirm/remind patient of their appointment at [facility] on [facility]/15/[facility].   Appointment:   [] Confirmed  [] Left mess   [] No answer/No voice mail  [] VM Full/unable to leave message  [x] Phone not in service

## 2024-06-15 ENCOUNTER — Ambulatory Visit (HOSPITAL_COMMUNITY)

## 2024-06-19 ENCOUNTER — Telehealth (HOSPITAL_COMMUNITY): Payer: Self-pay | Admitting: Cardiology

## 2024-06-25 ENCOUNTER — Institutional Professional Consult (permissible substitution): Admitting: Plastic Surgery
